# Patient Record
Sex: Female | Born: 1950 | Race: Black or African American | Hispanic: No | Smoking: Former smoker
Health system: Southern US, Community
[De-identification: ages and names within clinical notes are randomized; demographics above are authoritative.]

## PROBLEM LIST (undated history)

## (undated) DIAGNOSIS — N186 End stage renal disease: Secondary | ICD-10-CM

## (undated) DIAGNOSIS — E039 Hypothyroidism, unspecified: Secondary | ICD-10-CM

## (undated) DIAGNOSIS — F419 Anxiety disorder, unspecified: Secondary | ICD-10-CM

## (undated) DIAGNOSIS — M6740 Ganglion, unspecified site: Secondary | ICD-10-CM

## (undated) DIAGNOSIS — K219 Gastro-esophageal reflux disease without esophagitis: Secondary | ICD-10-CM

## (undated) DIAGNOSIS — Z953 Presence of xenogenic heart valve: Secondary | ICD-10-CM

## (undated) DIAGNOSIS — Z8679 Personal history of other diseases of the circulatory system: Secondary | ICD-10-CM

## (undated) DIAGNOSIS — I509 Heart failure, unspecified: Secondary | ICD-10-CM

## (undated) DIAGNOSIS — R519 Headache, unspecified: Secondary | ICD-10-CM

## (undated) DIAGNOSIS — J40 Bronchitis, not specified as acute or chronic: Secondary | ICD-10-CM

## (undated) DIAGNOSIS — I219 Acute myocardial infarction, unspecified: Secondary | ICD-10-CM

## (undated) DIAGNOSIS — M1990 Unspecified osteoarthritis, unspecified site: Secondary | ICD-10-CM

## (undated) DIAGNOSIS — H269 Unspecified cataract: Secondary | ICD-10-CM

## (undated) DIAGNOSIS — R0600 Dyspnea, unspecified: Secondary | ICD-10-CM

## (undated) DIAGNOSIS — I499 Cardiac arrhythmia, unspecified: Secondary | ICD-10-CM

## (undated) DIAGNOSIS — N184 Chronic kidney disease, stage 4 (severe): Secondary | ICD-10-CM

## (undated) DIAGNOSIS — M797 Fibromyalgia: Secondary | ICD-10-CM

## (undated) DIAGNOSIS — I480 Paroxysmal atrial fibrillation: Secondary | ICD-10-CM

## (undated) DIAGNOSIS — F32A Depression, unspecified: Secondary | ICD-10-CM

## (undated) DIAGNOSIS — I340 Nonrheumatic mitral (valve) insufficiency: Secondary | ICD-10-CM

## (undated) DIAGNOSIS — R011 Cardiac murmur, unspecified: Secondary | ICD-10-CM

## (undated) DIAGNOSIS — R51 Headache: Secondary | ICD-10-CM

## (undated) DIAGNOSIS — K429 Umbilical hernia without obstruction or gangrene: Secondary | ICD-10-CM

## (undated) DIAGNOSIS — E119 Type 2 diabetes mellitus without complications: Secondary | ICD-10-CM

## (undated) DIAGNOSIS — I10 Essential (primary) hypertension: Secondary | ICD-10-CM

## (undated) DIAGNOSIS — J189 Pneumonia, unspecified organism: Secondary | ICD-10-CM

## (undated) DIAGNOSIS — I2510 Atherosclerotic heart disease of native coronary artery without angina pectoris: Secondary | ICD-10-CM

## (undated) DIAGNOSIS — E785 Hyperlipidemia, unspecified: Secondary | ICD-10-CM

## (undated) DIAGNOSIS — Z9289 Personal history of other medical treatment: Secondary | ICD-10-CM

## (undated) DIAGNOSIS — Z9889 Other specified postprocedural states: Secondary | ICD-10-CM

## (undated) DIAGNOSIS — I639 Cerebral infarction, unspecified: Secondary | ICD-10-CM

## (undated) DIAGNOSIS — D649 Anemia, unspecified: Secondary | ICD-10-CM

## (undated) DIAGNOSIS — F329 Major depressive disorder, single episode, unspecified: Secondary | ICD-10-CM

## (undated) HISTORY — PX: ABDOMINAL HYSTERECTOMY: SHX81

## (undated) HISTORY — PX: TUBAL LIGATION: SHX77

## (undated) HISTORY — PX: CARDIAC CATHETERIZATION: SHX172

## (undated) HISTORY — PX: COLONOSCOPY: SHX174

## (undated) HISTORY — PX: TONSILLECTOMY: SUR1361

---
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
## 2017-01-07 ENCOUNTER — Other Ambulatory Visit: Payer: Self-pay | Admitting: Nephrology

## 2017-01-07 DIAGNOSIS — N184 Chronic kidney disease, stage 4 (severe): Secondary | ICD-10-CM

## 2017-01-08 ENCOUNTER — Other Ambulatory Visit: Payer: Self-pay

## 2017-01-13 ENCOUNTER — Ambulatory Visit
Admission: RE | Admit: 2017-01-13 | Discharge: 2017-01-13 | Disposition: A | Discharge status: Home / Self Care | Payer: Medicare Other | Source: Ambulatory Visit | Attending: Nephrology | Admitting: Nephrology

## 2017-01-13 DIAGNOSIS — N184 Chronic kidney disease, stage 4 (severe): Secondary | ICD-10-CM

## 2017-03-12 ENCOUNTER — Inpatient Hospital Stay (HOSPITAL_COMMUNITY)
Admission: EM | Admit: 2017-03-12 | Discharge: 2017-04-04 | DRG: 291 | Disposition: A | Discharge status: Skilled Nursing Facility | Payer: Medicare Other | Source: Other Acute Inpatient Hospital | Attending: Internal Medicine | Admitting: Internal Medicine

## 2017-03-12 ENCOUNTER — Inpatient Hospital Stay (HOSPITAL_COMMUNITY): Payer: Medicare Other

## 2017-03-12 ENCOUNTER — Encounter (HOSPITAL_COMMUNITY): Payer: Self-pay

## 2017-03-12 DIAGNOSIS — I5042 Chronic combined systolic (congestive) and diastolic (congestive) heart failure: Secondary | ICD-10-CM

## 2017-03-12 DIAGNOSIS — E871 Hypo-osmolality and hyponatremia: Secondary | ICD-10-CM | POA: Diagnosis present

## 2017-03-12 DIAGNOSIS — R0602 Shortness of breath: Secondary | ICD-10-CM

## 2017-03-12 DIAGNOSIS — Z452 Encounter for adjustment and management of vascular access device: Secondary | ICD-10-CM

## 2017-03-12 DIAGNOSIS — I5043 Acute on chronic combined systolic (congestive) and diastolic (congestive) heart failure: Secondary | ICD-10-CM

## 2017-03-12 DIAGNOSIS — N179 Acute kidney failure, unspecified: Secondary | ICD-10-CM | POA: Diagnosis present

## 2017-03-12 DIAGNOSIS — I051 Rheumatic mitral insufficiency: Secondary | ICD-10-CM | POA: Diagnosis not present

## 2017-03-12 DIAGNOSIS — N139 Obstructive and reflux uropathy, unspecified: Secondary | ICD-10-CM | POA: Diagnosis present

## 2017-03-12 DIAGNOSIS — T463X5A Adverse effect of coronary vasodilators, initial encounter: Secondary | ICD-10-CM | POA: Diagnosis not present

## 2017-03-12 DIAGNOSIS — E119 Type 2 diabetes mellitus without complications: Secondary | ICD-10-CM

## 2017-03-12 DIAGNOSIS — I493 Ventricular premature depolarization: Secondary | ICD-10-CM | POA: Diagnosis present

## 2017-03-12 DIAGNOSIS — Z936 Other artificial openings of urinary tract status: Secondary | ICD-10-CM

## 2017-03-12 DIAGNOSIS — I340 Nonrheumatic mitral (valve) insufficiency: Secondary | ICD-10-CM | POA: Diagnosis not present

## 2017-03-12 DIAGNOSIS — Y92239 Unspecified place in hospital as the place of occurrence of the external cause: Secondary | ICD-10-CM | POA: Diagnosis not present

## 2017-03-12 DIAGNOSIS — K661 Hemoperitoneum: Secondary | ICD-10-CM | POA: Diagnosis present

## 2017-03-12 DIAGNOSIS — D631 Anemia in chronic kidney disease: Secondary | ICD-10-CM | POA: Diagnosis not present

## 2017-03-12 DIAGNOSIS — E039 Hypothyroidism, unspecified: Secondary | ICD-10-CM | POA: Diagnosis not present

## 2017-03-12 DIAGNOSIS — K089 Disorder of teeth and supporting structures, unspecified: Secondary | ICD-10-CM

## 2017-03-12 DIAGNOSIS — Z9119 Patient's noncompliance with other medical treatment and regimen: Secondary | ICD-10-CM

## 2017-03-12 DIAGNOSIS — I5033 Acute on chronic diastolic (congestive) heart failure: Secondary | ICD-10-CM | POA: Diagnosis present

## 2017-03-12 DIAGNOSIS — G9341 Metabolic encephalopathy: Secondary | ICD-10-CM | POA: Diagnosis present

## 2017-03-12 DIAGNOSIS — R1084 Generalized abdominal pain: Secondary | ICD-10-CM

## 2017-03-12 DIAGNOSIS — F419 Anxiety disorder, unspecified: Secondary | ICD-10-CM | POA: Diagnosis present

## 2017-03-12 DIAGNOSIS — F17210 Nicotine dependence, cigarettes, uncomplicated: Secondary | ICD-10-CM | POA: Diagnosis present

## 2017-03-12 DIAGNOSIS — Z9114 Patient's other noncompliance with medication regimen: Secondary | ICD-10-CM

## 2017-03-12 DIAGNOSIS — D638 Anemia in other chronic diseases classified elsewhere: Secondary | ICD-10-CM

## 2017-03-12 DIAGNOSIS — G9340 Encephalopathy, unspecified: Secondary | ICD-10-CM

## 2017-03-12 DIAGNOSIS — E1165 Type 2 diabetes mellitus with hyperglycemia: Secondary | ICD-10-CM

## 2017-03-12 DIAGNOSIS — R52 Pain, unspecified: Secondary | ICD-10-CM

## 2017-03-12 DIAGNOSIS — I272 Pulmonary hypertension, unspecified: Secondary | ICD-10-CM

## 2017-03-12 DIAGNOSIS — E876 Hypokalemia: Secondary | ICD-10-CM | POA: Diagnosis not present

## 2017-03-12 DIAGNOSIS — Z79899 Other long term (current) drug therapy: Secondary | ICD-10-CM

## 2017-03-12 DIAGNOSIS — N183 Chronic kidney disease, stage 3 unspecified: Secondary | ICD-10-CM | POA: Diagnosis present

## 2017-03-12 DIAGNOSIS — I4892 Unspecified atrial flutter: Secondary | ICD-10-CM | POA: Diagnosis present

## 2017-03-12 DIAGNOSIS — D649 Anemia, unspecified: Secondary | ICD-10-CM | POA: Diagnosis not present

## 2017-03-12 DIAGNOSIS — T502X5A Adverse effect of carbonic-anhydrase inhibitors, benzothiadiazides and other diuretics, initial encounter: Secondary | ICD-10-CM | POA: Diagnosis present

## 2017-03-12 DIAGNOSIS — D62 Acute posthemorrhagic anemia: Secondary | ICD-10-CM | POA: Diagnosis present

## 2017-03-12 DIAGNOSIS — F329 Major depressive disorder, single episode, unspecified: Secondary | ICD-10-CM | POA: Diagnosis present

## 2017-03-12 DIAGNOSIS — J9600 Acute respiratory failure, unspecified whether with hypoxia or hypercapnia: Secondary | ICD-10-CM

## 2017-03-12 DIAGNOSIS — K5900 Constipation, unspecified: Secondary | ICD-10-CM

## 2017-03-12 DIAGNOSIS — I480 Paroxysmal atrial fibrillation: Secondary | ICD-10-CM | POA: Diagnosis not present

## 2017-03-12 DIAGNOSIS — D500 Iron deficiency anemia secondary to blood loss (chronic): Secondary | ICD-10-CM

## 2017-03-12 DIAGNOSIS — K219 Gastro-esophageal reflux disease without esophagitis: Secondary | ICD-10-CM | POA: Diagnosis present

## 2017-03-12 DIAGNOSIS — I2510 Atherosclerotic heart disease of native coronary artery without angina pectoris: Secondary | ICD-10-CM | POA: Diagnosis present

## 2017-03-12 DIAGNOSIS — D72829 Elevated white blood cell count, unspecified: Secondary | ICD-10-CM | POA: Diagnosis not present

## 2017-03-12 DIAGNOSIS — K029 Dental caries, unspecified: Secondary | ICD-10-CM | POA: Diagnosis present

## 2017-03-12 DIAGNOSIS — J9601 Acute respiratory failure with hypoxia: Secondary | ICD-10-CM | POA: Diagnosis present

## 2017-03-12 DIAGNOSIS — A419 Sepsis, unspecified organism: Secondary | ICD-10-CM | POA: Diagnosis not present

## 2017-03-12 DIAGNOSIS — F1910 Other psychoactive substance abuse, uncomplicated: Secondary | ICD-10-CM

## 2017-03-12 DIAGNOSIS — R0989 Other specified symptoms and signs involving the circulatory and respiratory systems: Secondary | ICD-10-CM | POA: Diagnosis not present

## 2017-03-12 DIAGNOSIS — J90 Pleural effusion, not elsewhere classified: Secondary | ICD-10-CM

## 2017-03-12 DIAGNOSIS — E118 Type 2 diabetes mellitus with unspecified complications: Secondary | ICD-10-CM

## 2017-03-12 DIAGNOSIS — E1122 Type 2 diabetes mellitus with diabetic chronic kidney disease: Secondary | ICD-10-CM | POA: Diagnosis present

## 2017-03-12 DIAGNOSIS — K117 Disturbances of salivary secretion: Secondary | ICD-10-CM | POA: Diagnosis present

## 2017-03-12 DIAGNOSIS — I959 Hypotension, unspecified: Secondary | ICD-10-CM | POA: Diagnosis not present

## 2017-03-12 DIAGNOSIS — I509 Heart failure, unspecified: Secondary | ICD-10-CM

## 2017-03-12 DIAGNOSIS — I469 Cardiac arrest, cause unspecified: Secondary | ICD-10-CM

## 2017-03-12 DIAGNOSIS — I248 Other forms of acute ischemic heart disease: Secondary | ICD-10-CM | POA: Diagnosis present

## 2017-03-12 DIAGNOSIS — N170 Acute kidney failure with tubular necrosis: Secondary | ICD-10-CM | POA: Diagnosis present

## 2017-03-12 DIAGNOSIS — F32A Depression, unspecified: Secondary | ICD-10-CM | POA: Insufficient documentation

## 2017-03-12 DIAGNOSIS — E872 Acidosis: Secondary | ICD-10-CM | POA: Diagnosis not present

## 2017-03-12 DIAGNOSIS — Z7982 Long term (current) use of aspirin: Secondary | ICD-10-CM

## 2017-03-12 DIAGNOSIS — R579 Shock, unspecified: Secondary | ICD-10-CM | POA: Diagnosis not present

## 2017-03-12 DIAGNOSIS — N185 Chronic kidney disease, stage 5: Secondary | ICD-10-CM | POA: Diagnosis present

## 2017-03-12 DIAGNOSIS — I132 Hypertensive heart and chronic kidney disease with heart failure and with stage 5 chronic kidney disease, or end stage renal disease: Secondary | ICD-10-CM | POA: Diagnosis present

## 2017-03-12 DIAGNOSIS — R51 Headache: Secondary | ICD-10-CM | POA: Diagnosis not present

## 2017-03-12 DIAGNOSIS — A4159 Other Gram-negative sepsis: Secondary | ICD-10-CM | POA: Diagnosis not present

## 2017-03-12 DIAGNOSIS — D509 Iron deficiency anemia, unspecified: Secondary | ICD-10-CM | POA: Diagnosis present

## 2017-03-12 DIAGNOSIS — N12 Tubulo-interstitial nephritis, not specified as acute or chronic: Secondary | ICD-10-CM | POA: Diagnosis not present

## 2017-03-12 DIAGNOSIS — E875 Hyperkalemia: Secondary | ICD-10-CM | POA: Diagnosis present

## 2017-03-12 DIAGNOSIS — N136 Pyonephrosis: Secondary | ICD-10-CM | POA: Diagnosis present

## 2017-03-12 DIAGNOSIS — J189 Pneumonia, unspecified organism: Secondary | ICD-10-CM

## 2017-03-12 DIAGNOSIS — J441 Chronic obstructive pulmonary disease with (acute) exacerbation: Secondary | ICD-10-CM | POA: Diagnosis present

## 2017-03-12 DIAGNOSIS — N189 Chronic kidney disease, unspecified: Secondary | ICD-10-CM | POA: Diagnosis not present

## 2017-03-12 DIAGNOSIS — Z1623 Resistance to quinolones and fluoroquinolones: Secondary | ICD-10-CM | POA: Diagnosis present

## 2017-03-12 DIAGNOSIS — R58 Hemorrhage, not elsewhere classified: Secondary | ICD-10-CM | POA: Diagnosis not present

## 2017-03-12 DIAGNOSIS — J9621 Acute and chronic respiratory failure with hypoxia: Secondary | ICD-10-CM | POA: Diagnosis present

## 2017-03-12 DIAGNOSIS — Z9889 Other specified postprocedural states: Secondary | ICD-10-CM

## 2017-03-12 DIAGNOSIS — Z09 Encounter for follow-up examination after completed treatment for conditions other than malignant neoplasm: Secondary | ICD-10-CM

## 2017-03-12 DIAGNOSIS — B9620 Unspecified Escherichia coli [E. coli] as the cause of diseases classified elsewhere: Secondary | ICD-10-CM | POA: Diagnosis present

## 2017-03-12 DIAGNOSIS — Z888 Allergy status to other drugs, medicaments and biological substances status: Secondary | ICD-10-CM

## 2017-03-12 DIAGNOSIS — I252 Old myocardial infarction: Secondary | ICD-10-CM

## 2017-03-12 DIAGNOSIS — B961 Klebsiella pneumoniae [K. pneumoniae] as the cause of diseases classified elsewhere: Secondary | ICD-10-CM | POA: Diagnosis present

## 2017-03-12 DIAGNOSIS — I5031 Acute diastolic (congestive) heart failure: Secondary | ICD-10-CM

## 2017-03-12 DIAGNOSIS — R001 Bradycardia, unspecified: Secondary | ICD-10-CM | POA: Diagnosis not present

## 2017-03-12 DIAGNOSIS — N132 Hydronephrosis with renal and ureteral calculous obstruction: Secondary | ICD-10-CM | POA: Diagnosis not present

## 2017-03-12 DIAGNOSIS — J9690 Respiratory failure, unspecified, unspecified whether with hypoxia or hypercapnia: Secondary | ICD-10-CM

## 2017-03-12 DIAGNOSIS — R7881 Bacteremia: Secondary | ICD-10-CM | POA: Diagnosis not present

## 2017-03-12 DIAGNOSIS — I059 Rheumatic mitral valve disease, unspecified: Secondary | ICD-10-CM | POA: Diagnosis present

## 2017-03-12 DIAGNOSIS — Z978 Presence of other specified devices: Secondary | ICD-10-CM

## 2017-03-12 DIAGNOSIS — Z539 Procedure and treatment not carried out, unspecified reason: Secondary | ICD-10-CM | POA: Diagnosis not present

## 2017-03-12 DIAGNOSIS — K083 Retained dental root: Secondary | ICD-10-CM | POA: Diagnosis present

## 2017-03-12 DIAGNOSIS — N184 Chronic kidney disease, stage 4 (severe): Secondary | ICD-10-CM | POA: Diagnosis not present

## 2017-03-12 DIAGNOSIS — E785 Hyperlipidemia, unspecified: Secondary | ICD-10-CM | POA: Insufficient documentation

## 2017-03-12 DIAGNOSIS — R1310 Dysphagia, unspecified: Secondary | ICD-10-CM | POA: Diagnosis not present

## 2017-03-12 DIAGNOSIS — A4101 Sepsis due to Methicillin susceptible Staphylococcus aureus: Secondary | ICD-10-CM | POA: Diagnosis not present

## 2017-03-12 DIAGNOSIS — M1990 Unspecified osteoarthritis, unspecified site: Secondary | ICD-10-CM | POA: Diagnosis present

## 2017-03-12 DIAGNOSIS — K0530 Chronic periodontitis, unspecified: Secondary | ICD-10-CM | POA: Diagnosis present

## 2017-03-12 DIAGNOSIS — I361 Nonrheumatic tricuspid (valve) insufficiency: Secondary | ICD-10-CM | POA: Diagnosis not present

## 2017-03-12 DIAGNOSIS — M264 Malocclusion, unspecified: Secondary | ICD-10-CM | POA: Diagnosis present

## 2017-03-12 DIAGNOSIS — I498 Other specified cardiac arrhythmias: Secondary | ICD-10-CM | POA: Diagnosis not present

## 2017-03-12 DIAGNOSIS — I10 Essential (primary) hypertension: Secondary | ICD-10-CM | POA: Diagnosis not present

## 2017-03-12 DIAGNOSIS — Z8673 Personal history of transient ischemic attack (TIA), and cerebral infarction without residual deficits: Secondary | ICD-10-CM

## 2017-03-12 DIAGNOSIS — E861 Hypovolemia: Secondary | ICD-10-CM | POA: Diagnosis present

## 2017-03-12 DIAGNOSIS — Z781 Physical restraint status: Secondary | ICD-10-CM

## 2017-03-12 DIAGNOSIS — J9622 Acute and chronic respiratory failure with hypercapnia: Secondary | ICD-10-CM

## 2017-03-12 DIAGNOSIS — E1142 Type 2 diabetes mellitus with diabetic polyneuropathy: Secondary | ICD-10-CM | POA: Diagnosis present

## 2017-03-12 DIAGNOSIS — Z4659 Encounter for fitting and adjustment of other gastrointestinal appliance and device: Secondary | ICD-10-CM

## 2017-03-12 DIAGNOSIS — N1330 Unspecified hydronephrosis: Secondary | ICD-10-CM

## 2017-03-12 HISTORY — DX: Anxiety disorder, unspecified: F41.9

## 2017-03-12 HISTORY — DX: Major depressive disorder, single episode, unspecified: F32.9

## 2017-03-12 HISTORY — DX: Hypothyroidism, unspecified: E03.9

## 2017-03-12 HISTORY — DX: Cerebral infarction, unspecified: I63.9

## 2017-03-12 HISTORY — DX: Hyperlipidemia, unspecified: E78.5

## 2017-03-12 HISTORY — DX: Gastro-esophageal reflux disease without esophagitis: K21.9

## 2017-03-12 HISTORY — DX: Depression, unspecified: F32.A

## 2017-03-12 HISTORY — DX: Unspecified osteoarthritis, unspecified site: M19.90

## 2017-03-12 HISTORY — DX: Type 2 diabetes mellitus without complications: E11.9

## 2017-03-12 HISTORY — DX: Bronchitis, not specified as acute or chronic: J40

## 2017-03-12 HISTORY — DX: Essential (primary) hypertension: I10

## 2017-03-12 LAB — URINALYSIS, ROUTINE W REFLEX MICROSCOPIC
Bacteria, UA: NONE SEEN
Bilirubin Urine: NEGATIVE
Glucose, UA: NEGATIVE mg/dL
Hgb urine dipstick: NEGATIVE
Ketones, ur: NEGATIVE mg/dL
Nitrite: NEGATIVE
Protein, ur: 100 mg/dL — AB
Specific Gravity, Urine: 1.008 (ref 1.005–1.030)
Squamous Epithelial / LPF: NONE SEEN
pH: 6 (ref 5.0–8.0)

## 2017-03-12 LAB — GLUCOSE, CAPILLARY
Glucose-Capillary: 113 mg/dL — ABNORMAL HIGH (ref 65–99)
Glucose-Capillary: 146 mg/dL — ABNORMAL HIGH (ref 65–99)
Glucose-Capillary: 143 mg/dL — ABNORMAL HIGH (ref 65–99)

## 2017-03-12 LAB — CREATININE, SERUM
Creatinine, Ser: 3.29 mg/dL — ABNORMAL HIGH (ref 0.44–1.00)
GFR calc Af Amer: 16 mL/min — ABNORMAL LOW (ref >60)
GFR calc non Af Amer: 14 mL/min — ABNORMAL LOW (ref >60)

## 2017-03-12 LAB — HEMOGLOBIN A1C
Hgb A1c MFr Bld: 6.2 % — ABNORMAL HIGH (ref 4.8–5.6)
Mean Plasma Glucose: 131.24 mg/dL

## 2017-03-12 LAB — ECHOCARDIOGRAM COMPLETE: Height: 62 in

## 2017-03-12 LAB — CBC
HCT: 28 % — ABNORMAL LOW (ref 36.0–46.0)
Hemoglobin: 9.6 g/dL — ABNORMAL LOW (ref 12.0–15.0)
MCH: 26.5 pg (ref 26.0–34.0)
MCHC: 34.3 g/dL (ref 30.0–36.0)
MCV: 77.3 fL — ABNORMAL LOW (ref 78.0–100.0)
Platelets: 275 10*3/uL (ref 150–400)
RBC: 3.62 MIL/uL — ABNORMAL LOW (ref 3.87–5.11)
RDW: 14.1 % (ref 11.5–15.5)
WBC: 8.4 10*3/uL (ref 4.0–10.5)

## 2017-03-12 LAB — TROPONIN I
Troponin I: 0.03 ng/mL (ref <0.03)
Troponin I: 0.03 ng/mL (ref <0.03)

## 2017-03-12 LAB — MRSA PCR SCREENING: MRSA by PCR: NEGATIVE

## 2017-03-12 LAB — BRAIN NATRIURETIC PEPTIDE: B Natriuretic Peptide: 2230.5 pg/mL — ABNORMAL HIGH (ref 0.0–100.0)

## 2017-03-12 LAB — TSH: TSH: 1.385 u[IU]/mL (ref 0.350–4.500)

## 2017-03-12 MED ORDER — HYDRALAZINE HCL 20 MG/ML IJ SOLN
10.0000 mg | Freq: Four times a day (QID) | INTRAMUSCULAR | Status: DC | PRN
  Administered 2017-03-12: 10 mg via INTRAVENOUS
  Filled 2017-03-12 (×2): qty 1

## 2017-03-12 MED ORDER — PANTOPRAZOLE SODIUM 40 MG PO TBEC
40.0000 mg | DELAYED_RELEASE_TABLET | Freq: Every day | ORAL | Status: DC
  Administered 2017-03-12 – 2017-03-16 (×5): 40 mg via ORAL
  Filled 2017-03-12 (×5): qty 1

## 2017-03-12 MED ORDER — MONTELUKAST SODIUM 10 MG PO TABS
10.0000 mg | ORAL_TABLET | Freq: Every day | ORAL | Status: DC
  Administered 2017-03-12 – 2017-03-15 (×4): 10 mg via ORAL
  Filled 2017-03-12 (×5): qty 1

## 2017-03-12 MED ORDER — GABAPENTIN 300 MG PO CAPS
300.0000 mg | ORAL_CAPSULE | ORAL | Status: DC
  Administered 2017-03-13 – 2017-03-15 (×2): 300 mg via ORAL
  Filled 2017-03-12 (×2): qty 1

## 2017-03-12 MED ORDER — CARVEDILOL 3.125 MG PO TABS: 3.1250 mg | ORAL_TABLET | Freq: Two times a day (BID) | ORAL | Status: DC

## 2017-03-12 MED ORDER — TRAMADOL HCL 50 MG PO TABS
50.0000 mg | ORAL_TABLET | Freq: Four times a day (QID) | ORAL | Status: DC | PRN
  Administered 2017-03-12 – 2017-03-15 (×5): 50 mg via ORAL
  Filled 2017-03-12 (×5): qty 1

## 2017-03-12 MED ORDER — OXYCODONE HCL 5 MG PO TABS
10.0000 mg | ORAL_TABLET | Freq: Three times a day (TID) | ORAL | Status: DC | PRN
  Administered 2017-03-12 – 2017-03-15 (×6): 10 mg via ORAL
  Filled 2017-03-12 (×8): qty 2

## 2017-03-12 MED ORDER — SODIUM CHLORIDE 0.9% FLUSH
3.0000 mL | INTRAVENOUS | Status: DC | PRN
  Administered 2017-03-20: 3 mL via INTRAVENOUS
  Filled 2017-03-12: qty 3

## 2017-03-12 MED ORDER — ACETAMINOPHEN 650 MG RE SUPP
650.0000 mg | Freq: Four times a day (QID) | RECTAL | Status: DC | PRN
Start: 2017-03-12 — End: 2017-03-23
  Administered 2017-03-19: 650 mg via RECTAL
  Filled 2017-03-12: qty 1

## 2017-03-12 MED ORDER — ENOXAPARIN SODIUM 30 MG/0.3ML ~~LOC~~ SOLN: 30.0000 mg | SUBCUTANEOUS | Status: DC

## 2017-03-12 MED ORDER — INSULIN ASPART 100 UNIT/ML ~~LOC~~ SOLN
0.0000 [IU] | Freq: Three times a day (TID) | SUBCUTANEOUS | Status: DC
  Administered 2017-03-12: 1 [IU] via SUBCUTANEOUS
  Administered 2017-03-13 – 2017-03-14 (×3): 2 [IU] via SUBCUTANEOUS
  Administered 2017-03-14 (×2): 1 [IU] via SUBCUTANEOUS
  Administered 2017-03-15: 7 [IU] via SUBCUTANEOUS
  Administered 2017-03-15 (×2): 3 [IU] via SUBCUTANEOUS
  Administered 2017-03-16: 2 [IU] via SUBCUTANEOUS

## 2017-03-12 MED ORDER — LABETALOL HCL 200 MG PO TABS
200.0000 mg | ORAL_TABLET | Freq: Two times a day (BID) | ORAL | Status: DC
  Administered 2017-03-12 – 2017-03-16 (×8): 200 mg via ORAL
  Filled 2017-03-12 (×9): qty 1

## 2017-03-12 MED ORDER — DULOXETINE HCL 30 MG PO CPEP
30.0000 mg | ORAL_CAPSULE | Freq: Every day | ORAL | Status: DC
  Administered 2017-03-12 – 2017-03-16 (×5): 30 mg via ORAL
  Filled 2017-03-12 (×5): qty 1

## 2017-03-12 MED ORDER — ORAL CARE MOUTH RINSE
15.0000 mL | Freq: Two times a day (BID) | OROMUCOSAL | Status: DC
  Administered 2017-03-13 – 2017-03-16 (×4): 15 mL via OROMUCOSAL

## 2017-03-12 MED ORDER — ALPRAZOLAM 0.5 MG PO TABS
0.5000 mg | ORAL_TABLET | Freq: Three times a day (TID) | ORAL | Status: DC | PRN
  Administered 2017-03-12 – 2017-03-15 (×8): 0.5 mg via ORAL
  Filled 2017-03-12 (×8): qty 1

## 2017-03-12 MED ORDER — TIZANIDINE HCL 4 MG PO TABS
4.0000 mg | ORAL_TABLET | Freq: Three times a day (TID) | ORAL | Status: DC
  Administered 2017-03-12 – 2017-03-16 (×12): 4 mg via ORAL
  Filled 2017-03-12 (×13): qty 1

## 2017-03-12 MED ORDER — HYDRALAZINE HCL 20 MG/ML IJ SOLN: 20.0000 mg | Freq: Four times a day (QID) | INTRAMUSCULAR | Status: DC | PRN

## 2017-03-12 MED ORDER — POLYETHYLENE GLYCOL 3350 17 G PO PACK: 17.0000 g | PACK | Freq: Every day | ORAL | Status: DC | PRN

## 2017-03-12 MED ORDER — MINOXIDIL 2.5 MG PO TABS: 2.5000 mg | ORAL_TABLET | Freq: Every day | ORAL | Status: DC

## 2017-03-12 MED ORDER — ASPIRIN EC 81 MG PO TBEC
81.0000 mg | DELAYED_RELEASE_TABLET | Freq: Every day | ORAL | Status: DC
  Administered 2017-03-12 – 2017-03-16 (×5): 81 mg via ORAL
  Filled 2017-03-12 (×5): qty 1

## 2017-03-12 MED ORDER — SODIUM CHLORIDE 0.9 % IV SOLN: 250.0000 mL | INTRAVENOUS | Status: DC | PRN

## 2017-03-12 MED ORDER — FUROSEMIDE 10 MG/ML IJ SOLN
40.0000 mg | Freq: Two times a day (BID) | INTRAMUSCULAR | Status: DC
  Administered 2017-03-12 – 2017-03-14 (×5): 40 mg via INTRAVENOUS
  Filled 2017-03-12 (×5): qty 4

## 2017-03-12 MED ORDER — BUPROPION HCL ER (XL) 150 MG PO TB24
300.0000 mg | ORAL_TABLET | Freq: Every day | ORAL | Status: DC
  Administered 2017-03-12 – 2017-03-16 (×5): 300 mg via ORAL
  Filled 2017-03-12 (×3): qty 1
  Filled 2017-03-12: qty 2
  Filled 2017-03-12: qty 1

## 2017-03-12 MED ORDER — ATORVASTATIN CALCIUM 40 MG PO TABS
40.0000 mg | ORAL_TABLET | Freq: Every day | ORAL | Status: DC
  Administered 2017-03-12 – 2017-03-16 (×5): 40 mg via ORAL
  Filled 2017-03-12 (×5): qty 1

## 2017-03-12 MED ORDER — ZOLPIDEM TARTRATE 5 MG PO TABS
5.0000 mg | ORAL_TABLET | Freq: Every evening | ORAL | Status: DC | PRN
  Filled 2017-03-12: qty 1

## 2017-03-12 MED ORDER — SODIUM CHLORIDE 0.9% FLUSH
3.0000 mL | Freq: Two times a day (BID) | INTRAVENOUS | Status: DC
  Administered 2017-03-12 – 2017-03-23 (×19): 3 mL via INTRAVENOUS

## 2017-03-12 MED ORDER — ACETAMINOPHEN 325 MG PO TABS
650.0000 mg | ORAL_TABLET | Freq: Four times a day (QID) | ORAL | Status: DC | PRN
Start: 2017-03-12 — End: 2017-03-23
  Administered 2017-03-15 – 2017-03-22 (×4): 650 mg via ORAL
  Filled 2017-03-12 (×4): qty 2

## 2017-03-12 MED ORDER — LEVALBUTEROL HCL 0.63 MG/3ML IN NEBU
0.6300 mg | INHALATION_SOLUTION | RESPIRATORY_TRACT | Status: DC | PRN
  Administered 2017-03-12: 0.63 mg via RESPIRATORY_TRACT
  Filled 2017-03-12: qty 3

## 2017-03-12 MED ORDER — METOPROLOL TARTRATE 5 MG/5ML IV SOLN
5.0000 mg | Freq: Four times a day (QID) | INTRAVENOUS | Status: DC
  Administered 2017-03-12 – 2017-03-13 (×3): 5 mg via INTRAVENOUS
  Filled 2017-03-12 (×3): qty 5

## 2017-03-12 MED ORDER — ENOXAPARIN SODIUM 30 MG/0.3ML ~~LOC~~ SOLN
30.0000 mg | SUBCUTANEOUS | Status: DC
  Administered 2017-03-13 – 2017-03-15 (×3): 30 mg via SUBCUTANEOUS
  Filled 2017-03-12 (×3): qty 0.3

## 2017-03-12 MED ORDER — LEVOTHYROXINE SODIUM 112 MCG PO TABS
112.0000 ug | ORAL_TABLET | Freq: Every day | ORAL | Status: DC
  Administered 2017-03-12 – 2017-03-16 (×5): 112 ug via ORAL
  Filled 2017-03-12 (×6): qty 1

## 2017-03-12 MED ORDER — AMLODIPINE BESYLATE 10 MG PO TABS
10.0000 mg | ORAL_TABLET | Freq: Every day | ORAL | Status: DC
  Administered 2017-03-12 – 2017-03-14 (×3): 10 mg via ORAL
  Filled 2017-03-12 (×3): qty 1

## 2017-03-12 MED ORDER — HYDRALAZINE HCL 50 MG PO TABS
100.0000 mg | ORAL_TABLET | Freq: Three times a day (TID) | ORAL | Status: DC
  Administered 2017-03-12 – 2017-03-14 (×6): 100 mg via ORAL
  Filled 2017-03-12 (×6): qty 2

## 2017-03-12 MED ORDER — PREGABALIN 75 MG PO CAPS
150.0000 mg | ORAL_CAPSULE | ORAL | Status: DC
  Administered 2017-03-13 – 2017-03-15 (×2): 150 mg via ORAL
  Filled 2017-03-12 (×2): qty 2

## 2017-03-12 MED ORDER — BUSPIRONE HCL 5 MG PO TABS
15.0000 mg | ORAL_TABLET | Freq: Every day | ORAL | Status: DC
  Administered 2017-03-13 – 2017-03-16 (×4): 15 mg via ORAL
  Filled 2017-03-12 (×5): qty 1

## 2017-03-12 NOTE — Progress Notes (Signed)
  Echocardiogram 2D Echocardiogram has been performed.  Jocelyn Hill 03/12/2017, 1:17 PM

## 2017-03-12 NOTE — Care Management Note (Signed)
Case Management Note  Patient Details  Name: Jocelyn Hill. Mallicoat MRN: [id number] Date of Birth: 22-Sep-1950  Subjective/Objective:                  66 y/o F with pmh of HTN and CKD stage III; percentage with complaints of shortness of breath for the last few days. On admission patient was found to be in significant respiratory distress for which she was placed on CPAP. BiPAP has helped decrease labored breathing. Chest x-ray showed moderate bilateral pleural effusions. Initial ABG on 100% arch and revealed pH of 7.34, PCO2 46, PO2 84.  Labs revealed a BNP of 1700, WBC 12, hemoglobin 10, INR 1.2, sodium 142, potassium 3.3, chloride 107, CO2 25, BUN 26, Cr 3.2, glucose 193. Patient was given 40 mg of Lasix and nitroglycerin place was applied. There were unable to wean her off of BiPAP  requested transfer for heart failure with renal failure as last available creatinine was 2.1 over 1 year ago.  Action/Plan: Date:  March 12, 2017 Chart reviewed for concurrent status and case management needs. Will continue to follow patient progress. Discharge Planning: following for needs Expected discharge date: [id number] Velva Harman, BSN, [place], [place] Expected Discharge Date:   (UNKNOWN)               Expected Discharge Plan:  [place]  In-House Referral:     Discharge planning Services  CM Consult  Post Acute Care Choice:  Home Health Choice offered to:  NA  DME Arranged:    DME Agency:     HH Arranged:  Disease Management [place] Agency:  E. Lopez  Status of Service:  In process, will continue to follow  If discussed at Long Length of Stay Meetings, dates discussed:    Additional Comments:  Leeroy Cha, RN 03/12/2017, 12:07 PM

## 2017-03-12 NOTE — Plan of Care (Signed)
Transfer from [place] accepted to stepdown bed. Ms. Jocelyn Hill is a 66 y/o F with pmh of HTN and CKD stage III; percentage with complaints of shortness of breath for the last few days. On admission patient was found to be in significant respiratory distress for which she was placed on CPAP. BiPAP has helped decrease labored breathing. Chest x-ray showed moderate bilateral pleural effusions. Initial ABG on 100% arch and revealed pH of 7.34, PCO2 46, PO2 84.  Labs revealed a BNP of 1700, WBC 12, hemoglobin 10, INR 1.2, sodium 142, potassium 3.3, chloride 107, CO2 25, BUN 26, Cr 3.2, glucose 193. Patient was given 40 mg of Lasix and nitroglycerin place was applied. There were unable to wean her off of BiPAP  requested transfer for heart failure with renal failure as last available creatinine was 2.1 over 1 year ago.

## 2017-03-12 NOTE — H&P (Signed)
History and Physical  Jocelyn Hill ION:[id number] DOB: [birth year] DOA: 03/12/2017  Referring physician: [facility]  PCP: [name], MD  Outpatient Specialists: None Patient coming from: Home & is able to ambulate without assistance  Chief Complaint: Shortness of breath   HPI: Jocelyn Hill is a 66 y.o. female with medical history significant for poorly controlled hypertension with stage III chronic kidney disease plus diabetes mellitus with peripheral neuropathy and hypothyroidism who states that over the past month, she is noted she has become more winded. Then starting on Saturday, 9/8, patient cut the point where she felt like she is having a lot of trouble breathing. The symptoms continued to progress to the point where she could not take it anymore and she came in to the emergency room at [facility] for further evaluation. Patient found to have a BNP of 1700 with a new diagnosis of CHF. Chest x-ray noted moderate bilateral pleural effusions. Patient was given Lasix and put on nitroglycerin patch. Blood pressure systolic in the 244W-102V. Patient initially BiPAP to keep oxygen saturations greater than 90%. In addition, creatinine noted to be 3.2 or baseline was 2.1. Because of worsening creatinine and decompensated CHF, it was felt best to transfer this patient to [facility]. Hospitalists accepted transfer.  Review of Systems: Patient seen after arrival to stepdown . Pt complains of shortness of breath, mild headache and chest discomfort, described as an ache and a little bit of pressure. Her shortness of breath is at rest although it is better than when she first came in. She occasionally has a nonproductive cough. Her chest pressure/ache is described as located midsternally with some radiation in all directions. Does not go to her back. No associated nausea or vomiting or dizziness. Patient complains of chronic numbness in her feet from neuropathy. She also feels like  in the past few weeks, she's had the faculty swallowing, feeling like something is stuck in her throat. Patient also states that in the past few months, some of her teeth have fallen out. She is not sure why. She also states that she gets very winded very easily with any exertion  Pt denies any vision changes, palpitations, wheezing, abdominal pain, hematuria, dysuria, constipation, diarrhea, focal extremity weakness or pain .  Review of systems are otherwise negative   Past Medical History:  Diagnosis Date  . Anxiety   . Arthritis   . Bronchitis   . Depression   . Diabetes mellitus without complication ([place])   . Diet-controlled diabetes mellitus ([place])   . GERD (gastroesophageal reflux disease)   . Hyperlipidemia   . Hypertension   . Hypothyroidism   . Stroke [facility])    Past Surgical History:  Procedure Laterality Date  . ABDOMINAL HYSTERECTOMY     PARTIALS  . TONSILLECTOMY    . TUBAL LIGATION      Social History:  Patient does not smoke regular cigarettes.  She has never used smokeless tobacco. She reports that she does not drink alcohol. She occasionally smokes marijuana. She is able to ambulate without assistance although she has no energy, lives at home by herself   Allergies  Allergen Reactions  . Ace Inhibitors Anaphylaxis and Swelling    Family history: Patient states heart failure runs in her family  Prior to Admission medications   Medication Sig Start Date End Date Taking? Authorizing Provider  albuterol (PROVENTIL HFA;VENTOLIN HFA) 108 (90 Base) MCG/ACT inhaler Inhale 2 puffs into the lungs every 4 (four) hours as needed  for wheezing or shortness of breath.   Yes [provider]  amLODipine (NORVASC) 10 MG tablet Take 10 mg by mouth daily.   Yes [provider]  aspirin EC 81 MG tablet Take 81 mg by mouth daily.   Yes [provider]  atorvastatin (LIPITOR) 40 MG tablet Take 40 mg by mouth daily.   Yes [provider]  b complex  vitamins capsule Take 1 capsule by mouth daily.   Yes [provider]  buPROPion (WELLBUTRIN XL) 300 MG 24 hr tablet Take 300 mg by mouth daily.   Yes [provider]  cholecalciferol (VITAMIN D) 1000 units tablet Take 1,000 Units by mouth daily.   Yes [provider]  DULoxetine (CYMBALTA) 30 MG capsule Take 30 mg by mouth daily.   Yes [provider]  gabapentin (NEURONTIN) 300 MG capsule Take 300 mg by mouth at bedtime.   Yes [provider]  hydrALAZINE (APRESOLINE) 100 MG tablet Take 100 mg by mouth 3 (three) times daily.   Yes [provider]  labetalol (NORMODYNE) 200 MG tablet Take 200 mg by mouth 2 (two) times daily.   Yes [provider]  levothyroxine (SYNTHROID, LEVOTHROID) 88 MCG tablet Take 88 mcg by mouth daily before breakfast.   Yes [provider]  minoxidil (LONITEN) 2.5 MG tablet Take by mouth daily.   Yes [provider]  montelukast (SINGULAIR) 10 MG tablet Take 10 mg by mouth daily.   Yes [provider]  pantoprazole (PROTONIX) 40 MG tablet Take 40 mg by mouth daily.   Yes [provider]  potassium gluconate (HM POTASSIUM) 595 (99 K) MG TABS tablet Take 595 mg by mouth daily.   Yes [provider]  pregabalin (LYRICA) 150 MG capsule Take 150 mg by mouth daily.   Yes [provider]  tiZANidine (ZANAFLEX) 4 MG capsule Take 4 mg by mouth 3 (three) times daily.   Yes [provider]  zolpidem (AMBIEN) 10 MG tablet Take 10 mg by mouth at bedtime as needed for sleep.   Yes [provider]  busPIRone (BUSPAR) 15 MG tablet Take 15 mg by mouth 2 (two) times daily.    [provider]    Physical Exam: BP (!) 158/117   Pulse 76   Temp 98.2 F (36.8 C) (Oral)   Resp (!) 23   Ht 5\' 9"  (1.753 m)   Wt 64.6 kg (142 lb 6.7 oz)   SpO2 93%   BMI 21.03 kg/m   General:  Alert and oriented 3, mild distress secondary to trouble breathing    Eyes: Sclera nonicteric, extraocular movements are intact  ENT: Normocephalic, atraumatic, mucous members are slightly dry  Neck: Supple, no JVD  Cardiovascular: Regular rate and rhythm, E9-B2, 2/6 systolic ejection murmur  Respiratory: Fine crackles bibasilar  Abdomen: Soft, nontender, nondistended, positive bowel sounds  Skin: No skin breaks, tears or lesions  Musculoskeletal: No clubbing or cyanosis, trace pitting edema bilaterally from the knees down  Psychiatric: Patient is appropriate, no evidence of psychoses  Neurologic: Decreased sensation in the lower extremities, otherwise no focal deficits           Labs on Admission:  Basic Metabolic Panel: No results for input(s): NA, K, CL, CO2, GLUCOSE, BUN, CREATININE, CALCIUM, MG, PHOS in the last 168 hours. Liver Function Tests: No results for input(s): AST, ALT, ALKPHOS, BILITOT, PROT, ALBUMIN in the last 168 hours. No results for input(s): LIPASE, AMYLASE in the last  168 hours. No results for input(s): AMMONIA in the last 168 hours. CBC:  Recent Labs Lab 03/12/17 1400  WBC 8.4  HGB 9.6*  HCT 28.0*  MCV 77.3*  PLT 275   Cardiac Enzymes: No results for input(s): CKTOTAL, CKMB, CKMBINDEX, TROPONINI in the last 168 hours.  BNP (last 3 results) No results for input(s): BNP in the last 8760 hours.  ProBNP (last 3 results) No results for input(s): PROBNP in the last 8760 hours.  CBG:  Recent Labs Lab 03/12/17 1249  GLUCAP 113*    Radiological Exams on Admission: No results found.  EKG: Independently reviewed.  Pending  Assessment/Plan Present on Admission: . Hyperlipidemia . Hypertension . Hypothyroidism . AKI (acute kidney injury) ([place]) . Depression . CKD (chronic kidney disease), stage III  Principal Problem:  Acute respiratory failure with hypoxia secondary to Acute CHF (congestive heart failure) ([place]): New finding. Echocardiogram already done and results are pending. Suspect systolic. Likely in the  setting of poorly controlled hypertension. However, if patient has marketed decrease in ejection fraction, may need ischemic workup through cardiology. Patient already improving, off of BiPAP  Active Problems:   Hyperlipidemia: Lipid panel ordered for morning   uncontrolled  Hypertension with secondary urgency: Will DC nitroglycerin patch once blood pressure back to normal range. Discussed with pharmacy. Presumed all patient's home medications however, patient herself is not taking her medicines as prescribed. Once her breathing improves, this should also help her blood pressure    Hypothyroidism: Checking TSH, continue Synthroid    Diet-controlled diabetes mellitus ([place]): Checking A1c, sliding scale      Depression: Continue home medications    Acute kidney injury in the setting of CKD (chronic kidney disease), stage III suspect secondary to poor perfusion from CHF, although will improve as we diurese her. Monitor kidney function closely. Underlying chronic kidney disease seems to be more related to hypertension and diabetes   headache: secondary to glycerin patch    Dysphagia sensation: Check swallow eval    DVT prophylaxis: Lovenox   Code Status: Full code as confirmed by patient   Family Communication: Sister at the bedside   Disposition Plan: Anticipate will be here for several days until blood pressure and oxygenation stable   Consults called: None   Admission status: Given need for acute inpatient services and patient is critically ill and will be here well past to midnight, admitted as inpatient     Annita Brod MD [facility] Pager [phone]  If 7PM-7AM, please contact night-coverage [email or web address] Password [facility]  03/12/2017, 3:17 PM

## 2017-03-13 ENCOUNTER — Inpatient Hospital Stay (HOSPITAL_COMMUNITY): Payer: Medicare Other

## 2017-03-13 DIAGNOSIS — I10 Essential (primary) hypertension: Secondary | ICD-10-CM

## 2017-03-13 DIAGNOSIS — D509 Iron deficiency anemia, unspecified: Secondary | ICD-10-CM

## 2017-03-13 DIAGNOSIS — D500 Iron deficiency anemia secondary to blood loss (chronic): Secondary | ICD-10-CM

## 2017-03-13 LAB — LIPID PANEL
Cholesterol: 103 mg/dL (ref 0–200)
HDL: 34 mg/dL — ABNORMAL LOW (ref >40)
LDL Cholesterol: 45 mg/dL (ref 0–99)
Total CHOL/HDL Ratio: 3 RATIO
Triglycerides: 121 mg/dL (ref <150)
VLDL: 24 mg/dL (ref 0–40)

## 2017-03-13 LAB — BASIC METABOLIC PANEL
Anion gap: 10 (ref 5–15)
BUN: 29 mg/dL — ABNORMAL HIGH (ref 6–20)
CO2: 26 mmol/L (ref 22–32)
Calcium: 8.5 mg/dL — ABNORMAL LOW (ref 8.9–10.3)
Chloride: 103 mmol/L (ref 101–111)
Creatinine, Ser: 3.15 mg/dL — ABNORMAL HIGH (ref 0.44–1.00)
GFR calc Af Amer: 17 mL/min — ABNORMAL LOW (ref >60)
GFR calc non Af Amer: 14 mL/min — ABNORMAL LOW (ref >60)
Glucose, Bld: 119 mg/dL — ABNORMAL HIGH (ref 65–99)
Potassium: 3.2 mmol/L — ABNORMAL LOW (ref 3.5–5.1)
Sodium: 139 mmol/L (ref 135–145)

## 2017-03-13 LAB — GLUCOSE, CAPILLARY
Glucose-Capillary: 152 mg/dL — ABNORMAL HIGH (ref 65–99)
Glucose-Capillary: 98 mg/dL (ref 65–99)
Glucose-Capillary: 178 mg/dL — ABNORMAL HIGH (ref 65–99)
Glucose-Capillary: 111 mg/dL — ABNORMAL HIGH (ref 65–99)

## 2017-03-13 LAB — TROPONIN I: Troponin I: 0.04 ng/mL (ref <0.03)

## 2017-03-13 MED ORDER — ALBUTEROL SULFATE (2.5 MG/3ML) 0.083% IN NEBU: 2.5000 mg | INHALATION_SOLUTION | RESPIRATORY_TRACT | Status: DC | PRN

## 2017-03-13 MED ORDER — POTASSIUM CHLORIDE 20 MEQ/15ML (10%) PO SOLN
40.0000 meq | Freq: Once | ORAL | Status: AC
  Administered 2017-03-13: 40 meq via ORAL
  Filled 2017-03-13: qty 30

## 2017-03-13 MED ORDER — IPRATROPIUM-ALBUTEROL 0.5-2.5 (3) MG/3ML IN SOLN
3.0000 mL | Freq: Four times a day (QID) | RESPIRATORY_TRACT | Status: DC
  Administered 2017-03-13 – 2017-03-16 (×13): 3 mL via RESPIRATORY_TRACT
  Filled 2017-03-13 (×13): qty 3

## 2017-03-13 MED ORDER — PREDNISONE 20 MG PO TABS: 40.0000 mg | ORAL_TABLET | Freq: Every day | ORAL | Status: DC

## 2017-03-13 NOTE — Progress Notes (Signed)
PROGRESS NOTE  Jocelyn Hill. Jocelyn Hill:[id number] DOB: 31-Mar-1951 DOA: 03/12/2017 PCP: [name], MD  Brief Narrative: 66 year old woman PMH orally controlled hypertension, chronic kidney disease stage III, MI, diabetes mellitus with peripheral neuropathy, presented with shortness of breath. BNP 1700, chest x-ray showed bilateral pleural effusions. SBP 180-190. Started on BiPAP. Because of suspected heart failure and elevated creatinine, transferred to higher level of care.  Assessment/Plan Acute hypoxic respiratory failure secondary to acute diastolic heart failure in the setting of uncontrolled hypertension and probable undiagnosed COPD. Remains off BiPAP.  -Wean oxygen as tolerated. Continue to treat heart failure, COPD and hypertension.  Acute diastolic congestive heart failure. Possible contribution would be minoxidil which can cause pulmonary edema if not on diuretic. I do not see that she was on a diuretic as an outpatient. -Continue IV diuresis, daily weights, strict I/O -Hold minoxidil. If resumed on discharge, would add diuretic.  Malignant hypertension. -Control improved. Continue amlodipine, Labetalol, hydralazine, minoxidil  Acute kidney injury superimposed on chronic kidney disease stage III. -Suspect secondary to relative hypoperfusion from pulmonary edema. Also consider progression of chronic kidney disease. -Check BMP in a.m. -obtain BMP/records from PCP  Possible COPD, smoker. Suspect COPD exacerbation. Uses albuterol at home. Not usually on oxygen. -Scheduled bronchodilators given poor air movement. When necessary albuterol. Start steroids.  Diabetes mellitus type 2 with peripheral neuropathy. Hemoglobin A1c 6.2. - diet-controlled. Continue sliding scale insulin. Continue Neurontin  Microcytic anemia -Check anemia panel. Consider outpatient evaluation.  Coronary artery disease -Troponins flat, trivial elevation. Secondary to demand ischemia. Asymptomatic. No  further evaluation suggested. -Continue Lipitor, aspirin  Hypothyroidism. TSH within normal limits. -Continue levothyroxine    Still with significant oxygen requirement, short of breath. Keep and stepdown.   DVT prophylaxis: enoxaparin Code Status: full Family Communication: none Disposition Plan: home    Jocelyn Hodgkins, MD  [facility] Direct contact: [phone] --[place]  --[email or web address]; password [email or web address]  7PM-7AM contact night coverage as above 03/13/2017, 9:08 AM  LOS: 1 day   Consultants:    Procedures:  Echo Study Conclusions  - Left ventricle: The cavity size was normal. Wall thickness was   normal. Systolic function was normal. The estimated ejection   fraction was in the range of 60% to 65%. Wall motion was normal;   there were no regional wall motion abnormalities. Features are   consistent with a pseudonormal left ventricular filling pattern,   with concomitant abnormal relaxation and increased filling   pressure (grade 2 diastolic dysfunction). - Mitral valve: There was severe regurgitation. - Left atrium: The atrium was severely dilated. - Right atrium: The atrium was mildly dilated. - Tricuspid valve: There was mild-moderate regurgitation. - Pulmonary arteries: Systolic pressure was moderately increased.   PA peak pressure: 60 mm Hg (S). - Pericardium, extracardiac: A small to moderate pericardial   effusion was identified  Antimicrobials:    Interval history/Subjective: Feels short of breath. Difficult to breathe. Had ankle edema yesterday. No nausea or vomiting. No pain.  Objective: Vitals: Afebrile, 98.3, 22, 50, 161/81, 90% on 8 L.  Exam:     Constitutional: Appears ill but not toxic. Calm. Mildly uncomfortable.  Eyes. Pupils, irises, lids appear unremarkable.  ENT. Grossly normal hearing, lips, tongue.  Respiratory. Poor air movement. No frank wheezes, rales or rhonchi. Mild to moderate increased respiratory  effort. Speaks in short sentences.  Cardiovascular. Regular rate and rhythm. No murmur, rub or gallop. No lower extremity edema. Telemetry sinus rhythm.  Abdomen. Soft, nontender, nondistended.  Psychiatric. Grossly normal mood and affect. Speech fluent and appropriate.   I have personally reviewed the following:  Urine output 1950 Labs:  Blood sugars stable  Potassium 3.2, creatinine slightly improved, 3.15. BUN 29.  Troponins flat, 0.03, 0.04  Hemoglobin 9.6 on admission  Imaging studies: Chest x-ray pulmonary interstitial edema   Scheduled Meds: . amLODipine  10 mg Oral Daily  . aspirin EC  81 mg Oral Daily  . atorvastatin  40 mg Oral Daily  . buPROPion  300 mg Oral Daily  . busPIRone  15 mg Oral Daily  . DULoxetine  30 mg Oral Daily  . enoxaparin (LOVENOX) injection  30 mg Subcutaneous Q24H  . furosemide  40 mg Intravenous BID  . gabapentin  300 mg Oral QODAY  . hydrALAZINE  100 mg Oral TID  . insulin aspart  0-9 Units Subcutaneous TID WC  . ipratropium-albuterol  3 mL Nebulization QID  . labetalol  200 mg Oral BID  . levothyroxine  112 mcg Oral QAC breakfast  . mouth rinse  15 mL Mouth Rinse BID  . montelukast  10 mg Oral QHS  . pantoprazole  40 mg Oral Daily  . [START ON 03/14/2017] predniSONE  40 mg Oral Q breakfast  . pregabalin  150 mg Oral QODAY  . sodium chloride flush  3 mL Intravenous Q12H  . tiZANidine  4 mg Oral TID   Continuous Infusions: . sodium chloride      Principal Problem:   Acute on chronic respiratory failure with hypoxia (H[facility]) Active Problems:   Hypertension   Hypothyroidism   Diet-controlled diabetes mellitus (H[facility])   Acute diastolic (congestive) heart failure (H[facility])   AKI (acute kidney injury) ([place])   CKD (chronic kidney disease), stage III   Dysphagia   Malignant hypertension   Microcytic anemia   LOS: 1 day

## 2017-03-13 NOTE — Evaluation (Signed)
Clinical/Bedside Swallow Evaluation Patient Details  Name: Jocelyn Hill. Nam MRN: [id number] Date of Birth: [birth year]  Today's Date: 03/13/2017 Time: SLP Start Time (ACUTE ONLY): 0930 SLP Stop Time (ACUTE ONLY): 1000 SLP Time Calculation (min) (ACUTE ONLY): 30 min  Past Medical History:  Past Medical History:  Diagnosis Date  . Anxiety   . Arthritis   . Bronchitis   . Depression   . Diabetes mellitus without complication ([place])   . Diet-controlled diabetes mellitus ([place])   . GERD (gastroesophageal reflux disease)   . Hyperlipidemia   . Hypertension   . Hypothyroidism   . Stroke [facility])    Past Surgical History:  Past Surgical History:  Procedure Laterality Date  . ABDOMINAL HYSTERECTOMY     PARTIALS  . TONSILLECTOMY    . TUBAL LIGATION     HPI:  66 yo female adm to [facility] with difficulty breathing and weakness. PMH + for CVA approximately 5 years ago per pt, GERD, MI, peripheral neuropathy, HTN, CKD.  Pt informed MD of dysphagia - sensing food lodging and recent dental loss.  She admits to this SLP that she has lost weight over the last year = 155-130s.  Pt states dysphagia has been ongoing for a "long time" = six months but worsening.  Intake "comes back up" and pt admits to occasionally choking on "emesis".  Pt admits to having endoscopy done approximately 10 years ago but does not recall if she was dilatated - she states swallow improved after endo and she is having similar symptoms currently.  She takes her protonix religiously per her statement.    Assessment / Plan / Recommendation Clinical Impression  Pt presents with symptoms consistent with esophageal dysphagia including discomfort with swallowing (with cold water during evaluation- warm coffee tolerated well) and globus sensation.  SlP only observed pt with liquids due to her excessive discomfort/chest pain with po.    No cranial nerve deficits impacting swallowing noted and pt denies dysphagia after her stoke 5 years ago.  Pt  admits to esophageal symptoms "for years" but worsening.  No indication of significant oropharyngeal dysphagia.  Xerostomia, poor dentition impact her swallowing however.   Recent "rash" on the roof of the mouth reported by this pt causing worsening discomfort within the last week.  Pt admits to taking a few antibiotic pills and states this improved her symptoms.  SLP ?s if pt could have oral candidiasis, reflux and possible dysmotility issues based on her symptoms.     Provided pt with information re: po with respiratory deficits, esophageal precautions, xerostomia tips verbally and in writing using teach back.    Dentist follow up as OP needed to help manage dentition/maxillary post.  Advised pt to follow up with GI as OP (GI scoped her 10 years ago) to help with chronic dysphagia.  Recommended consider room temp, warm liquids and small frequent meals if decreases symptoms.    No SLP follow up indicated.  Thanks.    SLP Visit Diagnosis: Dysphagia, unspecified (R13.10)    Aspiration Risk  Mild aspiration risk    Diet Recommendation Thin liquid;Regular (as tolerated)   Liquid Administration via: Cup;Straw Medication Administration: Whole meds with liquid Supervision: Patient able to self feed Compensations: Slow rate;Small sips/bites;Follow solids with liquid (start with liquids) Postural Changes: Remain upright for at least 30 minutes after po intake;Seated upright at 90 degrees    Other  Recommendations Recommended Consults: Other (Comment) (recommend follow up with GI as OP) Oral Care Recommendations: Oral care BID  Follow up Recommendations None      Frequency and Duration     n/a       Prognosis   n/a     Swallow Study   General Date of Onset: 03/13/17 HPI: 66 yo female adm to [facility] with difficulty breathing and weakness. PMH + for CVA approximately 5 years ago per pt, GERD, MI, peripheral neuropathy, HTN, CKD.  Pt informed MD of dysphagia - sensing food lodging and recent  dental loss.  She admits to this SLP that she has lost weight over the last year = 155-130s.  Pt states dysphagia has been ongoing for a "long time" = six months but worsening.  Intake "comes back up" and pt admits to occasionally choking on "emesis".  Pt admits to having endoscopy done approximately 10 years ago but does not recall if she was dilatated - she states swallow improved after endo and she is having similar symptoms currently.  She takes her protonix religiously per her statement.  Type of Study: Bedside Swallow Evaluation Diet Prior to this Study: Regular;Thin liquids Temperature Spikes Noted: No Respiratory Status: Other (comment) (HFNC 10) History of Recent Intubation: No Behavior/Cognition: Alert;Cooperative;Pleasant mood Oral Cavity Assessment: Dry;Other (comment) (pt reports recent rash on roof of mouth with discomfort, examination unremarkable) Oral Care Completed by SLP: No Oral Cavity - Dentition: Missing dentition;Other (Comment) (post in oral cavity without tooth, many teeth missing, poor conditiion) Vision: Functional for self-feeding Self-Feeding Abilities: Able to feed self Patient Positioning: Upright in bed Baseline Vocal Quality: Other (comment) (mildly weak) Volitional Cough: Strong Volitional Swallow: Unable to elicit    Oral/Motor/Sensory Function Overall Oral Motor/Sensory Function: Within functional limits   Ice Chips Ice chips: Not tested   Thin Liquid Thin Liquid: Within functional limits Presentation: Self Fed;Cup;Straw Other Comments: coffee and water    Nectar Thick Nectar Thick Liquid: Not tested   Honey Thick Honey Thick Liquid: Not tested   Puree Puree: Not tested   Solid   GO   Solid: Not tested        Jocelyn Hill 03/13/2017,10:24 AM  Luanna Salk, [place] [facility] SLP [phone]

## 2017-03-14 DIAGNOSIS — I5033 Acute on chronic diastolic (congestive) heart failure: Secondary | ICD-10-CM

## 2017-03-14 LAB — IRON AND TIBC
Iron: 19 ug/dL — ABNORMAL LOW (ref 28–170)
Saturation Ratios: 8 % — ABNORMAL LOW (ref 10.4–31.8)
TIBC: 228 ug/dL — ABNORMAL LOW (ref 250–450)
UIBC: 209 ug/dL

## 2017-03-14 LAB — GLUCOSE, CAPILLARY
Glucose-Capillary: 132 mg/dL — ABNORMAL HIGH (ref 65–99)
Glucose-Capillary: 145 mg/dL — ABNORMAL HIGH (ref 65–99)
Glucose-Capillary: 180 mg/dL — ABNORMAL HIGH (ref 65–99)
Glucose-Capillary: 201 mg/dL — ABNORMAL HIGH (ref 65–99)

## 2017-03-14 LAB — FERRITIN: Ferritin: 41 ng/mL (ref 11–307)

## 2017-03-14 LAB — BASIC METABOLIC PANEL
Anion gap: 5 (ref 5–15)
BUN: 32 mg/dL — ABNORMAL HIGH (ref 6–20)
CO2: 30 mmol/L (ref 22–32)
Calcium: 8.7 mg/dL — ABNORMAL LOW (ref 8.9–10.3)
Chloride: 104 mmol/L (ref 101–111)
Creatinine, Ser: 3.52 mg/dL — ABNORMAL HIGH (ref 0.44–1.00)
GFR calc Af Amer: 15 mL/min — ABNORMAL LOW (ref >60)
GFR calc non Af Amer: 13 mL/min — ABNORMAL LOW (ref >60)
Glucose, Bld: 124 mg/dL — ABNORMAL HIGH (ref 65–99)
Potassium: 3.6 mmol/L (ref 3.5–5.1)
Sodium: 139 mmol/L (ref 135–145)

## 2017-03-14 LAB — TROPONIN I
Troponin I: 0.03 ng/mL (ref <0.03)
Troponin I: 0.03 ng/mL (ref <0.03)

## 2017-03-14 LAB — HIV ANTIBODY (ROUTINE TESTING W REFLEX): HIV Screen 4th Generation wRfx: NONREACTIVE

## 2017-03-14 LAB — FOLATE: Folate: 11.4 ng/mL (ref >5.9)

## 2017-03-14 LAB — VITAMIN B12: Vitamin B-12: 1193 pg/mL — ABNORMAL HIGH (ref 180–914)

## 2017-03-14 LAB — MAGNESIUM: Magnesium: 1.7 mg/dL (ref 1.7–2.4)

## 2017-03-14 MED ORDER — METHYLPREDNISOLONE SODIUM SUCC 125 MG IJ SOLR
80.0000 mg | Freq: Three times a day (TID) | INTRAMUSCULAR | Status: DC
  Administered 2017-03-14 – 2017-03-15 (×3): 80 mg via INTRAVENOUS
  Filled 2017-03-14 (×3): qty 2

## 2017-03-14 MED ORDER — FAMOTIDINE 20 MG PO TABS
20.0000 mg | ORAL_TABLET | Freq: Every day | ORAL | Status: DC | PRN
  Administered 2017-03-14: 20 mg via ORAL
  Filled 2017-03-14: qty 1

## 2017-03-14 MED ORDER — POTASSIUM CHLORIDE CRYS ER 20 MEQ PO TBCR
40.0000 meq | EXTENDED_RELEASE_TABLET | Freq: Once | ORAL | Status: AC
  Administered 2017-03-14: 40 meq via ORAL
  Filled 2017-03-14: qty 2

## 2017-03-14 MED ORDER — HYDRALAZINE HCL 20 MG/ML IJ SOLN: 10.0000 mg | Freq: Four times a day (QID) | INTRAMUSCULAR | Status: DC | PRN

## 2017-03-14 MED ORDER — NITROGLYCERIN 2 % TD OINT
0.5000 [in_us] | TOPICAL_OINTMENT | Freq: Three times a day (TID) | TRANSDERMAL | Status: DC
  Administered 2017-03-14 – 2017-03-15 (×2): 0.5 [in_us] via TOPICAL
  Filled 2017-03-14: qty 30

## 2017-03-14 MED ORDER — FUROSEMIDE 10 MG/ML IJ SOLN
40.0000 mg | Freq: Three times a day (TID) | INTRAMUSCULAR | Status: DC
  Administered 2017-03-14 – 2017-03-16 (×6): 40 mg via INTRAVENOUS
  Filled 2017-03-14 (×6): qty 4

## 2017-03-14 MED ORDER — NITROGLYCERIN 2 % TD OINT
1.0000 [in_us] | TOPICAL_OINTMENT | Freq: Four times a day (QID) | TRANSDERMAL | Status: DC
  Filled 2017-03-14: qty 30

## 2017-03-14 MED ORDER — NITROGLYCERIN 2 % TD OINT
0.5000 [in_us] | TOPICAL_OINTMENT | Freq: Three times a day (TID) | TRANSDERMAL | Status: DC
  Filled 2017-03-14: qty 0.5

## 2017-03-14 MED ORDER — FUROSEMIDE 10 MG/ML IJ SOLN
40.0000 mg | Freq: Three times a day (TID) | INTRAMUSCULAR | Status: DC
Start: 2017-03-14 — End: 2017-03-14

## 2017-03-14 MED ORDER — DEXTROSE 5 % IV SOLN
500.0000 mg | INTRAVENOUS | Status: DC
  Administered 2017-03-14 – 2017-03-15 (×2): 500 mg via INTRAVENOUS
  Filled 2017-03-14 (×3): qty 500

## 2017-03-14 MED ORDER — AMLODIPINE BESYLATE 5 MG PO TABS
5.0000 mg | ORAL_TABLET | Freq: Every day | ORAL | Status: DC
  Administered 2017-03-15: 5 mg via ORAL
  Filled 2017-03-14: qty 1

## 2017-03-14 MED ORDER — FUROSEMIDE 10 MG/ML IJ SOLN: 40.0000 mg | Freq: Two times a day (BID) | INTRAMUSCULAR | Status: DC

## 2017-03-14 MED ORDER — HYDRALAZINE HCL 25 MG PO TABS: 25.0000 mg | ORAL_TABLET | Freq: Three times a day (TID) | ORAL | Status: DC

## 2017-03-14 MED ORDER — HYDRALAZINE HCL 50 MG PO TABS
50.0000 mg | ORAL_TABLET | Freq: Three times a day (TID) | ORAL | Status: DC
  Administered 2017-03-14 – 2017-03-15 (×3): 50 mg via ORAL
  Filled 2017-03-14 (×3): qty 1

## 2017-03-14 NOTE — Consult Note (Signed)
Primary cardiologist: n/a Consulting cardiologist: Dr Carlyle Dolly Requesting physicinan: Dr Maudie Mercury Indication: severe mitral regurgitation, acute diastolic heart failure  Clinical Summary Jocelyn Hill is a 66 y.o.female history of HTN, CKD 3, DM2, hypothryoidism admitted with SOB. She reports gradual onset of symptoms over the last several weeks. Denies any significant chest pain. Seen initially in [place] ER, started on bipap. Transferred to [facility].    Admit labs WBC 8.4, Hgb 9.6, Plt 275, BNP 2230, TSH 1.3, K 3.2, Cr 3.29 CXR pulm edema Trop 0.03-->0.03-->0.04 Echo 02/2017: LVEF 60-65%, no WMAs, grade II diastolic dysfunction, severe MR, severe LAE, mild to mod TR, PASP 60, small to moderate pericardial effusion EKG sinus brady, LAE, possible anteroseptal infarct   Allergies  Allergen Reactions  . Ace Inhibitors Anaphylaxis and Swelling    Medications Scheduled Medications: . amLODipine  10 mg Oral Daily  . aspirin EC  81 mg Oral Daily  . atorvastatin  40 mg Oral Daily  . buPROPion  300 mg Oral Daily  . busPIRone  15 mg Oral Daily  . DULoxetine  30 mg Oral Daily  . enoxaparin (LOVENOX) injection  30 mg Subcutaneous Q24H  . furosemide  40 mg Intravenous BID  . gabapentin  300 mg Oral QODAY  . hydrALAZINE  100 mg Oral TID  . insulin aspart  0-9 Units Subcutaneous TID WC  . ipratropium-albuterol  3 mL Nebulization QID  . labetalol  200 mg Oral BID  . levothyroxine  112 mcg Oral QAC breakfast  . mouth rinse  15 mL Mouth Rinse BID  . methylPREDNISolone (SOLU-MEDROL) injection  80 mg Intravenous Q8H  . montelukast  10 mg Oral QHS  . pantoprazole  40 mg Oral Daily  . pregabalin  150 mg Oral QODAY  . sodium chloride flush  3 mL Intravenous Q12H  . tiZANidine  4 mg Oral TID     Infusions: . sodium chloride    . azithromycin       PRN Medications:  sodium chloride, acetaminophen **OR** acetaminophen, albuterol, ALPRAZolam, hydrALAZINE, oxyCODONE,  polyethylene glycol, sodium chloride flush, traMADol, zolpidem   Past Medical History:  Diagnosis Date  . Anxiety   . Arthritis   . Bronchitis   . Depression   . Diabetes mellitus without complication ([place])   . Diet-controlled diabetes mellitus ([place])   . GERD (gastroesophageal reflux disease)   . Hyperlipidemia   . Hypertension   . Hypothyroidism   . Stroke [facility])     Past Surgical History:  Procedure Laterality Date  . ABDOMINAL HYSTERECTOMY     PARTIALS  . TONSILLECTOMY    . TUBAL LIGATION      History reviewed. No pertinent family history.  Social History Jocelyn Hill reports that she has been smoking Cigarettes.  She has never used smokeless tobacco. Jocelyn Hill reports that she does not drink alcohol.  Review of Systems CONSTITUTIONAL: No weight loss, fever, chills, weakness or fatigue.  HEENT: Eyes: No visual loss, blurred vision, double vision or yellow sclerae. No hearing loss, sneezing, congestion, runny nose or sore throat.  SKIN: No rash or itching.  CARDIOVASCULAR: no chest pain, no palpitations RESPIRATORY: +SOB GASTROINTESTINAL: No anorexia, nausea, vomiting or diarrhea. No abdominal pain or blood.  GENITOURINARY: no polyuria, no dysuria NEUROLOGICAL: No headache, dizziness, syncope, paralysis, ataxia, numbness or tingling in the extremities. No change in bowel or bladder control.  MUSCULOSKELETAL: No muscle, back pain, joint pain or stiffness.  HEMATOLOGIC: No anemia, bleeding or bruising.  LYMPHATICS: No enlarged nodes. No history of splenectomy.  PSYCHIATRIC: No history of depression or anxiety.      Physical Examination Blood pressure (!) 150/77, pulse 63, temperature 98.1 F (36.7 C), temperature source Oral, resp. rate (!) 22, height 5\' 9"  (1.753 m), weight 146 lb 2.6 oz (66.3 kg), SpO2 93 %.  Intake/Output Summary (Last 24 hours) at 03/14/17 0926 Last data filed at 03/14/17 0400  Gross per 24 hour  Intake              480 ml  Output              1650 ml  Net            -1170 ml    HEENT: sclera clear, throat clear  Cardiovascular: RRR, 3/6 systolic murmur at apex, elevated JVD  Respiratory: bilateral crackles  GI: abdomen soft, NT, ND  MSK: no LE edema  Neuro: no focal deficits  Psych: appropriate affect   Lab Results  Basic Metabolic Panel:  Recent Labs Lab 03/12/17 1400 03/13/17 0202 03/14/17 0320  NA  --  139 139  K  --  3.2* 3.6  CL  --  103 104  CO2  --  26 30  GLUCOSE  --  119* 124*  BUN  --  29* 32*  CREATININE 3.29* 3.15* 3.52*  CALCIUM  --  8.5* 8.7*    Liver Function Tests: No results for input(s): AST, ALT, ALKPHOS, BILITOT, PROT, ALBUMIN in the last 168 hours.  CBC:  Recent Labs Lab 03/12/17 1400  WBC 8.4  HGB 9.6*  HCT 28.0*  MCV 77.3*  PLT 275    Cardiac Enzymes:  Recent Labs Lab 03/12/17 1400 03/12/17 1952 03/13/17 0202  TROPONINI 0.03* 0.03* 0.04*    BNP: Invalid input(s): POCBNP   ECG   Imaging   Impression/Recommendations  1. Acute diastolic HF - echo LVEF 62-69%, grade II diastolic dysfunction - complicated by severe MR and renal dysfunction - negative 1.1 liters yesterday, negative 3.1 liters since admission. Uptrend in Cr. She is on lasix 40mg  IV bid.  - remains volume overloaded by exam, back on bipap this AM. Increase lasix to 40mg  IV tid   2. Mitral regurgitation - described as severe by echo. LVEF 60-65%, LVIDs 44, PASP 60.  - will review study, may require TEE to further evaluate once respiratory status improved. At this time unclear of mechanism for her MR. Based on presentation appears symptomatic, based on LVIDs evidence of LV dilatation, may need consideration for MV intervention. Renal function will be an issue if needs cath as part of workup - renew NG patch, continue hydralazine for additional afterload reduction   3. HTN - notes indicate history of poorly controlled HTN, patient noncompliant with home meds - she had evidence of end  organ damage with diastolic HF, renal dysfunction - bp's improving on current regiment, contniue to follow, should come down with further diuresis as well.    4. CKD III-IV - H&P reports baselien Cr of 2.1, unclear where this result was found, do not see in epic. Unclear to me how much is chronic vs acute.  - consider renal consult if don't have significant improvement with diuresis  5. PVCs - K 3.6, Mg pending - give KCl 40, f/u Mg results. Keep K at 4 Mg at 2 - continue beta blocker    Carlyle Dolly, M.D.

## 2017-03-14 NOTE — Progress Notes (Signed)
Pt currently off BIPAP and on 15 LPM [place] .  Pt tolerating well at this time, RT to monitor and assess as needed.

## 2017-03-14 NOTE — Progress Notes (Signed)
Patient ID: Jocelyn Hill. Jocelyn Hill, female   DOB: 18-Nov-1950, 66 y.o.   MRN: [id number]                                                                PROGRESS NOTE                                                                                                                                                                                                             Patient Demographics:    Jocelyn Hill, is a 66 y.o. female, DOB - September 25, 1950, JJH:[id number]  Admit date - 03/12/2017   Admitting Physician Annita Brod, MD  Outpatient Primary MD for the patient is Antil, Venia Carbon, MD  LOS - 2  Outpatient Specialists:     No chief complaint on file.    Dyspnea  Brief Narrative  66 year old woman PMH orally controlled hypertension, chronic kidney disease stage III, MI, diabetes mellitus with peripheral neuropathy, presented with shortness of breath. BNP 1700, chest x-ray showed bilateral pleural effusions. SBP 180-190. Started on BiPAP. Because of suspected heart failure and elevated creatinine, transferred to higher level of care.   Subjective:    Jocelyn Hill today has slight sharp chest pain across her chest since last nite.  Pt states given tramadol, still has dicomfort. Sob slightly worse since yesterday but better than admission.   No headache,  No abdominal pain - No Nausea, No new weakness tingling or numbness,    Assessment  & Plan :    Principal Problem:   Acute on chronic respiratory failure with hypoxia (HCC) Active Problems:   Hypertension   Hypothyroidism   Diet-controlled diabetes mellitus (HCC)   Acute diastolic (congestive) heart failure (HCC)   AKI (acute kidney injury) ([place])   CKD (chronic kidney disease), stage III   Dysphagia   Malignant hypertension   Microcytic anemia   Severe MR Cardiology consulted, appreciate input  Acute hypoxic respiratory failure secondary to acute diastolic heart failure in the setting of uncontrolled hypertension and probable  undiagnosed COPD. Restart bipap due to increase in dyspnea.  Agree w assessment by prior physician May have Copd component ,  will tx with solumedrol 80mg  iv q8, and zithromax 500mg  iv qday But most of her dyspnea is probably from her CHF and severe MR CXR  today   Acute diastolic congestive heart failure. Possible contribution would be minoxidil which can cause pulmonary edema if not on diuretic. I do not see that she was on a diuretic as an outpatient. -Continue IV diuresis, daily weights, strict I/O -Hold minoxidil. If resumed on discharge, would add diuretic.  Malignant hypertension. -Control improved. Continue amlodipine, Labetalol, hydralazine, minoxidil  Acute kidney injury superimposed on chronic kidney disease stage III. -Suspect secondary to relative hypoperfusion from pulmonary edema. Also consider progression of chronic kidney disease. -Check cmp in am  Possible COPD, smoker. Suspect COPD exacerbation. Uses albuterol at home. Not usually on oxygen. -Scheduled bronchodilators given poor air movement. When necessary albuterol  Add solumedrol / zithromax   Diabetes mellitus type 2 with peripheral neuropathy. Hemoglobin A1c 6.2. - diet-controlled. Continue sliding scale insulin. Continue Neurontin  Microcytic anemia -Check anemia panel. Consider outpatient evaluation.  Coronary artery disease -Troponins flat, trivial elevation. Secondary to demand ischemia. Asymptomatic. No further evaluation suggested. -Continue Lipitor, aspirin  Hypothyroidism. TSH within normal limits. -Continue levothyroxine    Code Status : FULL CODE  Family Communication  : w patient  Disposition Plan  : continue stepdown  Barriers For Discharge :   Consults  :  cardiology  Procedures  : echo 9/13  DVT Prophylaxis  :  Lovenox -SCDs   Lab Results  Component Value Date   PLT 275 03/12/2017    Antibiotics  :    Anti-infectives    None        Objective:   Vitals:    03/14/17 0425 03/14/17 0500 03/14/17 0600 03/14/17 0700  BP:  128/65 137/63 (!) 150/77  Pulse:  (!) 59 60 63  Resp:  18 18 (!) 22  Temp:      TempSrc:      SpO2:  96% 94% 93%  Weight: 66.3 kg (146 lb 2.6 oz)     Height:        Wt Readings from Last 3 Encounters:  03/14/17 66.3 kg (146 lb 2.6 oz)     Intake/Output Summary (Last 24 hours) at 03/14/17 0726 Last data filed at 03/14/17 0400  Gross per 24 hour  Intake              600 ml  Output             1750 ml  Net            -1150 ml     Physical Exam  Awake Alert, Oriented X 3, No new F.N deficits, Normal affect [place].AT,PERRAL Supple Neck,   +  JVD, No cervical lymphadenopathy appriciated.  Heart: rrr s1, s2 2/6 sem apex Lung:   slight decrease in bs at right lung base, slight crackle left lung base, slight exp wheezing Abd: +ve B.Sounds, Abd Soft, No tenderness, No organomegaly appriciated, No rebound - guarding or rigidity. No Cyanosis, Clubbing or edema, No new Rash or bruise      Data Review:    CBC  Recent Labs Lab 03/12/17 1400  WBC 8.4  HGB 9.6*  HCT 28.0*  PLT 275  MCV 77.3*  MCH 26.5  MCHC 34.3  RDW 14.1    Chemistries   Recent Labs Lab 03/12/17 1400 03/13/17 0202 03/14/17 0320  NA  --  139 139  K  --  3.2* 3.6  CL  --  103 104  CO2  --  26 30  GLUCOSE  --  119* 124*  BUN  --  29* 32*  CREATININE 3.29* 3.15* 3.52*  CALCIUM  --  8.5* 8.7*   ------------------------------------------------------------------------------------------------------------------  Recent Labs  03/13/17 0202  CHOL 103  HDL 34*  LDLCALC 45  TRIG 121  CHOLHDL 3.0    Lab Results  Component Value Date   HGBA1C 6.2 (H) 03/12/2017   ------------------------------------------------------------------------------------------------------------------  Recent Labs  03/12/17 1400  TSH 1.385    ------------------------------------------------------------------------------------------------------------------ No results for input(s): VITAMINB12, FOLATE, FERRITIN, TIBC, IRON, RETICCTPCT in the last 72 hours.  Coagulation profile No results for input(s): INR, PROTIME in the last 168 hours.  No results for input(s): DDIMER in the last 72 hours.  Cardiac Enzymes  Recent Labs Lab 03/12/17 1400 03/12/17 1952 03/13/17 0202  TROPONINI 0.03* 0.03* 0.04*   ------------------------------------------------------------------------------------------------------------------    Component Value Date/Time   BNP 2,230.5 (H) 03/12/2017 1400    Inpatient Medications  Scheduled Meds: . amLODipine  10 mg Oral Daily  . aspirin EC  81 mg Oral Daily  . atorvastatin  40 mg Oral Daily  . buPROPion  300 mg Oral Daily  . busPIRone  15 mg Oral Daily  . DULoxetine  30 mg Oral Daily  . enoxaparin (LOVENOX) injection  30 mg Subcutaneous Q24H  . furosemide  40 mg Intravenous BID  . gabapentin  300 mg Oral QODAY  . hydrALAZINE  100 mg Oral TID  . insulin aspart  0-9 Units Subcutaneous TID WC  . ipratropium-albuterol  3 mL Nebulization QID  . labetalol  200 mg Oral BID  . levothyroxine  112 mcg Oral QAC breakfast  . mouth rinse  15 mL Mouth Rinse BID  . montelukast  10 mg Oral QHS  . pantoprazole  40 mg Oral Daily  . predniSONE  40 mg Oral Q breakfast  . pregabalin  150 mg Oral QODAY  . sodium chloride flush  3 mL Intravenous Q12H  . tiZANidine  4 mg Oral TID   Continuous Infusions: . sodium chloride     PRN Meds:.sodium chloride, acetaminophen **OR** acetaminophen, albuterol, ALPRAZolam, hydrALAZINE, oxyCODONE, polyethylene glycol, sodium chloride flush, traMADol, zolpidem  Micro Results Recent Results (from the past 240 hour(s))  MRSA PCR Screening     Status: None   Collection Time: 03/12/17  9:52 AM  Result Value Ref Range Status   MRSA by PCR NEGATIVE NEGATIVE Final    Comment:         The GeneXpert MRSA Assay (FDA approved for NASAL specimens only), is one component of a comprehensive MRSA colonization surveillance program. It is not intended to diagnose MRSA infection nor to guide or monitor treatment for MRSA infections.     Radiology Reports Dg Chest Port 1 View  Result Date: 03/13/2017 CLINICAL DATA:  CHF. EXAM: PORTABLE CHEST 1 VIEW COMPARISON:  03/12/2017. FINDINGS: Cardiomegaly with bilateral from interstitial prominence and bilateral pleural effusions consistent CHF. No change prior exam. No evidence of pneumothorax. No acute bony abnormality. IMPRESSION: Congestive heart failure with pulmonary interstitial edema and bilateral pleural effusions. No change from prior exam. Electronically Signed   By: Marcello Moores  Register   On: 03/13/2017 06:23    Time Spent in minutes  30   Jocelyn Hill M.D on 03/14/2017 at 7:26 AM  Between 7am to 7pm - Pager - [phone]  After 7pm go to [email or web address] - password [email or web address]  [facility] -  Office  [phone]

## 2017-03-14 NOTE — Progress Notes (Signed)
Pt continues to complain with SOD. Pt doesn't appear to be in distress. The nurse stated that the Pt's SATS dropped and when she was complaning with SOB and she increased her FIO2 to 70%. The wave form is not consistant. RT increased her IPAP to 13 and her EPAP to 7 and decreased her FIO2 to 50%. The Pt's SAT was 94% on these settings with a good waveform. If we continue to have issues with the Pulse ox RT will try to place one on the Pt's ear. RT will continue to monitor.

## 2017-03-15 LAB — TROPONIN I: Troponin I: <0.03 ng/mL (ref <0.03)

## 2017-03-15 LAB — CBC
HCT: 26.9 % — ABNORMAL LOW (ref 36.0–46.0)
Hemoglobin: 9.4 g/dL — ABNORMAL LOW (ref 12.0–15.0)
MCH: 26.8 pg (ref 26.0–34.0)
MCHC: 34.9 g/dL (ref 30.0–36.0)
MCV: 76.6 fL — ABNORMAL LOW (ref 78.0–100.0)
Platelets: 236 10*3/uL (ref 150–400)
RBC: 3.51 MIL/uL — ABNORMAL LOW (ref 3.87–5.11)
RDW: 13.8 % (ref 11.5–15.5)
WBC: 6.2 10*3/uL (ref 4.0–10.5)

## 2017-03-15 LAB — GLUCOSE, CAPILLARY
Glucose-Capillary: 308 mg/dL — ABNORMAL HIGH (ref 65–99)
Glucose-Capillary: 242 mg/dL — ABNORMAL HIGH (ref 65–99)
Glucose-Capillary: 234 mg/dL — ABNORMAL HIGH (ref 65–99)
Glucose-Capillary: 262 mg/dL — ABNORMAL HIGH (ref 65–99)

## 2017-03-15 LAB — COMPREHENSIVE METABOLIC PANEL
ALT: 23 U/L (ref 14–54)
AST: 18 U/L (ref 15–41)
Albumin: 3 g/dL — ABNORMAL LOW (ref 3.5–5.0)
Alkaline Phosphatase: 83 U/L (ref 38–126)
Anion gap: 12 (ref 5–15)
BUN: 37 mg/dL — ABNORMAL HIGH (ref 6–20)
CO2: 22 mmol/L (ref 22–32)
Calcium: 8.7 mg/dL — ABNORMAL LOW (ref 8.9–10.3)
Chloride: 100 mmol/L — ABNORMAL LOW (ref 101–111)
Creatinine, Ser: 3.53 mg/dL — ABNORMAL HIGH (ref 0.44–1.00)
GFR calc Af Amer: 15 mL/min — ABNORMAL LOW (ref >60)
GFR calc non Af Amer: 13 mL/min — ABNORMAL LOW (ref >60)
Glucose, Bld: 165 mg/dL — ABNORMAL HIGH (ref 65–99)
Potassium: 4.3 mmol/L (ref 3.5–5.1)
Sodium: 134 mmol/L — ABNORMAL LOW (ref 135–145)
Total Bilirubin: 0.9 mg/dL (ref 0.3–1.2)
Total Protein: 5.7 g/dL — ABNORMAL LOW (ref 6.5–8.1)

## 2017-03-15 LAB — MAGNESIUM: Magnesium: 1.7 mg/dL (ref 1.7–2.4)

## 2017-03-15 LAB — MRSA PCR SCREENING: MRSA by PCR: NEGATIVE

## 2017-03-15 MED ORDER — HYDRALAZINE HCL 25 MG PO TABS
25.0000 mg | ORAL_TABLET | Freq: Three times a day (TID) | ORAL | Status: DC
  Administered 2017-03-15 – 2017-03-16 (×2): 25 mg via ORAL
  Filled 2017-03-15 (×2): qty 1

## 2017-03-15 MED ORDER — AMLODIPINE BESYLATE 2.5 MG PO TABS
2.5000 mg | ORAL_TABLET | Freq: Two times a day (BID) | ORAL | Status: DC
  Administered 2017-03-16: 2.5 mg via ORAL
  Filled 2017-03-15: qty 1

## 2017-03-15 MED ORDER — PREDNISONE 20 MG PO TABS
60.0000 mg | ORAL_TABLET | Freq: Every day | ORAL | Status: DC
  Administered 2017-03-15: 60 mg via ORAL
  Filled 2017-03-15: qty 3

## 2017-03-15 NOTE — Progress Notes (Addendum)
Patient ID: Jocelyn Hill. Quant, female   DOB: [birth year], 66 y.o.   MRN: [id number]                                                                PROGRESS NOTE                                                                                                                                                                                                             Patient Demographics:    Jocelyn Hill, is a 66 y.o. female, DOB - [birth year], YOV:[id number]  Admit date - 03/12/2017   Admitting Physician Annita Brod, MD  Outpatient Primary MD for the patient is Antil, Venia Carbon, MD  LOS - 3  Outpatient Specialists:    Hollie Salk (nephrology)  No chief complaint on file.    dyspnea  Brief Narrative  66 year old woman PMH orally controlled hypertension, chronic kidney disease stage III, MI, diabetes mellitus with peripheral neuropathy, presented with shortness of breath. BNP 1700, chest x-ray showed bilateral pleural effusions. SBP 180-190. Started on BiPAP. Because of suspected heart failure and elevated creatinine, transferred to higher level of care.   Hospital course:  Pt admitted on 9/13 and started on diuresis w lasix 40mg  iv bid.  Pt tx with prednisone for ? Copd exacerbation.  Pt thought to have dyspnea secondary to CHF, /Copd.   Cardiac echo on 9/13 => EF 60-65%, and severe MR.  Cardiology consulted.  Pt required bipap for brief episodes on 9/15, and her lasix was increased to 40mg  iv tid.   Pt briefly hypotensive in 9/15 am.  Her bp medication adjusted.  Creatinine remains at about 3.5   Subjective:    Jocelyn Hill today feels her breathing is better.  Pt has been on iv lasix which was increased by cardiology 9/15,  Pt had brief episode of hypotension yesterday and her bp medications adjusted. Slight chest discomfort still this am. Trop 0.03.  Cardiology following.  Trying to diurese so can have TEE to further evaluate MR  No headache, No abdominal pain - No Nausea, No new weakness  tingling or numbness,    Assessment  & Plan :    Principal Problem:   Acute on chronic respiratory failure with hypoxia (HCC) Active Problems:   Hypertension   Hypothyroidism  Diet-controlled diabetes mellitus (HCC)   Acute diastolic (congestive) heart failure (HCC)   AKI (acute kidney injury) (HCC)   CKD (chronic kidney disease), stage III   CHF (congestive heart failure) ([place])   Dysphagia   Malignant hypertension   Microcytic anemia   Severe MR Cardiology consulted, appreciate input Because the patient will eventually need TEE and CT surgery consult , will transfer to [facility]   Acute hypoxic respiratory failuresecondary to acute diastolic heart failure in the setting of uncontrolled hypertensionand probable undiagnosed COPD. Restart bipap due to increase in dyspnea.  Agree w assessment by prior physician May have Copd component ,  will tx with solumedrol 80mg  iv q8, and zithromax 500mg  iv qday (started 9/15)=> prednisone 9/16 But most of her dyspnea is probably from her CHF and severe MR CXR   Acute diastolic congestive heart failure.Possible contribution would be minoxidil which can cause pulmonary edema if not on diuretic. I do not see that she was on a diuretic as an outpatient. -Continue IV diuresis, daily weights, strict I/O -Hold minoxidil. If resumed on discharge, would add diuretic.  Malignant hypertension., hypotensive on 9/15 am Decreased amlodipine to 5mg  po qday,  Decrease hydralazine to 50mg  po tid Cont labetalol, cont ntp  Acute kidney injurysuperimposed on chronic kidney disease stage III. -Suspect secondary to relative hypoperfusion from pulmonary edema. Also consider progression of chronic kidney disease. -Check cmp in am Nephrology consult, (pt had appt on 9/17 w Dr. Hollie Salk)  Possible COPD,smoker.Suspect COPD exacerbation. Uses albuterol at home. Not usually on oxygen. -Scheduled bronchodilators given poor air movement. When necessary  albuterol  solumetrol => prednisone 60mg  po qday Cont zithromax   Diabetes mellitus type 2 with peripheral neuropathy. Hemoglobin A1c 6.2. -diet-controlled. Continue sliding scale insulin. Continue Neurontin  Microcytic anemia -Check anemia panel. Consider outpatient evaluation.  Coronary artery disease -Troponins flat, trivial elevation. Secondary to demand ischemia. Asymptomatic. No further evaluation suggested. -Continue Lipitor, aspirin  Hypothyroidism. TSH within normal limits. -Continue levothyroxine    Code Status : FULL CODE  Family Communication  : w patient  Disposition Plan  : continue stepdown  Barriers For Discharge :   Consults  :  cardiology  Procedures  : echo 9/13 => EF 65%, severe MR  DVT Prophylaxis  :  Lovenox -SCDs   Lab Results  Component Value Date   PLT 236 03/15/2017    Antibiotics  :  zithromax 9/15=>  Anti-infectives    Start     Dose/Rate Route Frequency Ordered Stop   03/14/17 0900  azithromycin (ZITHROMAX) 500 mg in dextrose 5 % 250 mL IVPB     500 mg 250 mL/hr over 60 Minutes Intravenous Every 24 hours 03/14/17 0802          Objective:   Vitals:   03/15/17 0700 03/15/17 0727 03/15/17 0730 03/15/17 0800  BP: 139/76     Pulse: (!) 59  67 71  Resp: 15  20 (!) 22  Temp:      TempSrc:      SpO2: 97% 96% 97% 98%  Weight:      Height:        Wt Readings from Last 3 Encounters:  03/15/17 68 kg (149 lb 14.6 oz)     Intake/Output Summary (Last 24 hours) at 03/15/17 0815 Last data filed at 03/15/17 0500  Gross per 24 hour  Intake              500 ml  Output  1050 ml  Net             -550 ml     Physical Exam  Awake Alert, Oriented X 3, No new F.N deficits, Normal affect [place].AT,PERRAL Supple Neck, slight  JVD, No cervical lymphadenopathy appriciated.  Symmetrical Chest wall movement, Good air movement bilaterally, decrease in bs at right lung base, slight crackles left lung base rrr s1, s2  2/6 sem apex +ve B.Sounds, Abd Soft, No tenderness, No organomegaly appriciated, No rebound - guarding or rigidity. No Cyanosis, Clubbing or edema, No new Rash or bruise      Data Review:    CBC  Recent Labs Lab 03/12/17 1400 03/15/17 0222  WBC 8.4 6.2  HGB 9.6* 9.4*  HCT 28.0* 26.9*  PLT 275 236  MCV 77.3* 76.6*  MCH 26.5 26.8  MCHC 34.3 34.9  RDW 14.1 13.8    Chemistries   Recent Labs Lab 03/12/17 1400 03/13/17 0202 03/14/17 0313 03/14/17 0320 03/15/17 0222  NA  --  139  --  139 134*  K  --  3.2*  --  3.6 4.3  CL  --  103  --  104 100*  CO2  --  26  --  30 22  GLUCOSE  --  119*  --  124* 165*  BUN  --  29*  --  32* 37*  CREATININE 3.29* 3.15*  --  3.52* 3.53*  CALCIUM  --  8.5*  --  8.7* 8.7*  MG  --   --  1.7  --  1.7  AST  --   --   --   --  18  ALT  --   --   --   --  23  ALKPHOS  --   --   --   --  83  BILITOT  --   --   --   --  0.9   ------------------------------------------------------------------------------------------------------------------  Recent Labs  03/13/17 0202  CHOL 103  HDL 34*  LDLCALC 45  TRIG 121  CHOLHDL 3.0    Lab Results  Component Value Date   HGBA1C 6.2 (H) 03/12/2017   ------------------------------------------------------------------------------------------------------------------  Recent Labs  03/12/17 1400  TSH 1.385   ------------------------------------------------------------------------------------------------------------------  Recent Labs  03/14/17 0320  VITAMINB12 1,193*  FOLATE 11.4  FERRITIN 41  TIBC 228*  IRON 19*    Coagulation profile No results for input(s): INR, PROTIME in the last 168 hours.  No results for input(s): DDIMER in the last 72 hours.  Cardiac Enzymes  Recent Labs Lab 03/14/17 1501 03/14/17 2026 03/15/17 0222  TROPONINI 0.03* 0.03* <0.03   ------------------------------------------------------------------------------------------------------------------      Component Value Date/Time   BNP 2,230.5 (H) 03/12/2017 1400    Inpatient Medications  Scheduled Meds: . amLODipine  5 mg Oral Daily  . aspirin EC  81 mg Oral Daily  . atorvastatin  40 mg Oral Daily  . buPROPion  300 mg Oral Daily  . busPIRone  15 mg Oral Daily  . DULoxetine  30 mg Oral Daily  . enoxaparin (LOVENOX) injection  30 mg Subcutaneous Q24H  . furosemide  40 mg Intravenous TID  . gabapentin  300 mg Oral QODAY  . hydrALAZINE  50 mg Oral TID  . insulin aspart  0-9 Units Subcutaneous TID WC  . ipratropium-albuterol  3 mL Nebulization QID  . labetalol  200 mg Oral BID  . levothyroxine  112 mcg Oral QAC breakfast  . mouth rinse  15 mL Mouth Rinse  BID  . methylPREDNISolone (SOLU-MEDROL) injection  80 mg Intravenous Q8H  . montelukast  10 mg Oral QHS  . nitroGLYCERIN  0.5 inch Topical Q8H  . pantoprazole  40 mg Oral Daily  . pregabalin  150 mg Oral QODAY  . sodium chloride flush  3 mL Intravenous Q12H  . tiZANidine  4 mg Oral TID   Continuous Infusions: . sodium chloride    . azithromycin Stopped (03/14/17 1118)   PRN Meds:.sodium chloride, acetaminophen **OR** acetaminophen, albuterol, ALPRAZolam, famotidine, hydrALAZINE, oxyCODONE, polyethylene glycol, sodium chloride flush, traMADol, zolpidem  Micro Results Recent Results (from the past 240 hour(s))  MRSA PCR Screening     Status: None   Collection Time: 03/12/17  9:52 AM  Result Value Ref Range Status   MRSA by PCR NEGATIVE NEGATIVE Final    Comment:        The GeneXpert MRSA Assay (FDA approved for NASAL specimens only), is one component of a comprehensive MRSA colonization surveillance program. It is not intended to diagnose MRSA infection nor to guide or monitor treatment for MRSA infections.     Radiology Reports Dg Chest Port 1 View  Result Date: 03/13/2017 CLINICAL DATA:  CHF. EXAM: PORTABLE CHEST 1 VIEW COMPARISON:  03/12/2017. FINDINGS: Cardiomegaly with bilateral from interstitial prominence  and bilateral pleural effusions consistent CHF. No change prior exam. No evidence of pneumothorax. No acute bony abnormality. IMPRESSION: Congestive heart failure with pulmonary interstitial edema and bilateral pleural effusions. No change from prior exam. Electronically Signed   By: Marcello Moores  Register   On: 03/13/2017 06:23    Time Spent in minutes  30   Jani Gravel M.D on 03/15/2017 at 8:15 AM  Between 7am to 7pm - Pager - [phone]  After 7pm go to [email or web address] - password [facility]  [facility] -  Office  [phone]

## 2017-03-15 NOTE — Consult Note (Signed)
Renal Service Consult Note [facility]  Jocelyn Hill. Jocelyn Hill 03/15/2017 [place] Requesting Physician:  Dr Maudie Mercury  Reason for Consult:  CKD 3/4 with CHF HPI: The patient is a 66 y.o. year-old with hx of CVA, HTN, HL, low T4, GERD, depression, anxiety, DJD presented on 9/13 with SOB/ DOE to [facility].  Symptoms ongoing > 1 month.  In ED BNP was 1700 and CHF per CXR. Required bipap initially, creat 3.2 on admission w/ reportedly baseline of 2.1.  Pt transferred for admission to [facility] due to renal failure.  Getting nebs/ steroids/ abx for suspected underlying COPD as well.   Since here pt diuresing some with 4.2 L UOP over 3 days. Creat ranging 3.1- 3.5 here. UA is 100 prot, no cells. Patient says she is breathing better.  L arm hurting after IV infiltrated.   No prior admissions here, nor in [place]. Lives in [place].  Divorced, smokes marijuana, says she doesn't smoke cigarettes.     ROS  denies CP  no joint pain   no HA  no blurry vision  no rash  no diarrhea  no nausea/ vomiting   Past Medical History  Past Medical History:  Diagnosis Date  . Anxiety   . Arthritis   . Bronchitis   . Depression   . Diabetes mellitus without complication ([place])   . Diet-controlled diabetes mellitus ([place])   . GERD (gastroesophageal reflux disease)   . Hyperlipidemia   . Hypertension   . Hypothyroidism   . Stroke [facility])    Past Surgical History  Past Surgical History:  Procedure Laterality Date  . ABDOMINAL HYSTERECTOMY     PARTIALS  . TONSILLECTOMY    . TUBAL LIGATION     Family History History reviewed. No pertinent family history. Social History  reports that she has been smoking Cigarettes.  She has never used smokeless tobacco. She reports that she does not drink alcohol or use drugs. Allergies  Allergies  Allergen Reactions  . Ace Inhibitors Anaphylaxis and Swelling   Home medications Prior to Admission medications   Medication Sig Start Date End Date  Taking? Authorizing Provider  albuterol (PROVENTIL HFA;VENTOLIN HFA) 108 (90 Base) MCG/ACT inhaler Inhale 2 puffs into the lungs every 4 (four) hours as needed for wheezing or shortness of breath.   Yes [provider]  amLODipine (NORVASC) 10 MG tablet Take 10 mg by mouth daily.   Yes [provider]  aspirin EC 81 MG tablet Take 81 mg by mouth daily.   Yes [provider]  atorvastatin (LIPITOR) 40 MG tablet Take 40 mg by mouth daily.   Yes [provider]  b complex vitamins capsule Take 1 capsule by mouth daily.   Yes [provider]  buPROPion (WELLBUTRIN XL) 300 MG 24 hr tablet Take 300 mg by mouth daily.   Yes [provider]  cholecalciferol (VITAMIN D) 1000 units tablet Take 1,000 Units by mouth daily.   Yes [provider]  DULoxetine (CYMBALTA) 30 MG capsule Take 30 mg by mouth daily.   Yes [provider]  gabapentin (NEURONTIN) 300 MG capsule Take 300 mg by mouth at bedtime.   Yes [provider]  hydrALAZINE (APRESOLINE) 100 MG tablet Take 100 mg by mouth 3 (three) times daily.   Yes [provider]  labetalol (NORMODYNE) 200 MG tablet Take 200 mg by mouth 2 (two) times daily.   Yes [provider]  levothyroxine (SYNTHROID, LEVOTHROID) 112 MCG tablet Take 112  mcg by mouth daily before breakfast.   Yes [provider]  minoxidil (LONITEN) 2.5 MG tablet Take by mouth daily.   Yes [provider]  montelukast (SINGULAIR) 10 MG tablet Take 10 mg by mouth daily.   Yes [provider]  Oxycodone HCl 10 MG TABS Take 10 mg by mouth 3 (three) times daily as needed (pain).   Yes [provider]  pantoprazole (PROTONIX) 40 MG tablet Take 40 mg by mouth daily.   Yes [provider]  potassium gluconate (HM POTASSIUM) 595 (99 K) MG TABS tablet Take 595 mg by mouth daily.   Yes [provider]  pregabalin (LYRICA) 150 MG capsule Take 150 mg by  mouth daily.   Yes [provider]  tiZANidine (ZANAFLEX) 4 MG capsule Take 4 mg by mouth 3 (three) times daily.   Yes [provider]  zolpidem (AMBIEN) 10 MG tablet Take 10 mg by mouth at bedtime as needed for sleep.   Yes [provider]  busPIRone (BUSPAR) 15 MG tablet Take 15 mg by mouth 2 (two) times daily.    [provider]   Liver Function Tests  Recent Labs Lab 03/15/17 0222  AST 18  ALT 23  ALKPHOS 83  BILITOT 0.9  PROT 5.7*  ALBUMIN 3.0*   No results for input(s): LIPASE, AMYLASE in the last 168 hours. CBC  Recent Labs Lab 03/12/17 1400 03/15/17 0222  WBC 8.4 6.2  HGB 9.6* 9.4*  HCT 28.0* 26.9*  MCV 77.3* 76.6*  PLT 275 034   Basic Metabolic Panel  Recent Labs Lab 03/12/17 1400 03/13/17 0202 03/14/17 0320 03/15/17 0222  NA  --  139 139 134*  K  --  3.2* 3.6 4.3  CL  --  103 104 100*  CO2  --  26 30 22   GLUCOSE  --  119* 124* 165*  BUN  --  29* 32* 37*  CREATININE 3.29* 3.15* 3.52* 3.53*  CALCIUM  --  8.5* 8.7* 8.7*   Iron/TIBC/Ferritin/ %Sat    Component Value Date/Time   IRON 19 (L) 03/14/2017 0320   TIBC 228 (L) 03/14/2017 0320   FERRITIN 41 03/14/2017 0320   IRONPCTSAT 8 (L) 03/14/2017 0320    Vitals:   03/15/17 0700 03/15/17 0727 03/15/17 0730 03/15/17 0800  BP: 139/76   134/84  Pulse: (!) 59  67 71  Resp: 15  20 (!) 22  Temp:    98.3 F (36.8 C)  TempSrc:    Oral  SpO2: 97% 96% 97% 98%  Weight:      Height:       Exam Gen alert, tired No rash, cyanosis or gangrene Sclera anicteric, throat clear  No jvd or bruits, JVP 10-14 Chest bibasilar rales, L base bronchial BS RRR 2/6 sem, no RG Abd soft ntnd no mass or ascites +bs GU defer MS no joint effusions or deformity Ext focal LUE edema, no LE edema, no wounds or ulcers Neuro is alert, Ox 3 , nf   Home meds :   - norvasc/ hydralazine 100 tid/ labetalol 200 bid/ minoxidil 2.5 qd - albuterol/ asa/ lipitor/ T4 / singulair/ PPI / KCl  -  wellbutrin / cymbalta/ neurontin/ Lyrica / ambien prn / oxy prn   CXR 9-14 > +CHF moderate I/O - 1.1 L in and 4.7 L out since admission on 9/13 Creat 3.1- 3.5 range here this admission, no old creat ECHO - LVEF 60%, severe MR, G2DD, mild-mod TR, severe LAE,  PA peak 60 mm   Impression: 1.  CKD - probably stage IV, no old records here though.  Has been seen once this summer at [facility], will get records tomorrow.  Reported that baseline creat 2.1.  2.  CHF/ vol overload - combination fluid retention from CKD4, severe MR. Diuresing, would continue, still wet, ,repeat CXR in am.  3.  HTN - long hx poorly controlled on 4 bp meds at home, overcontrolled now w BP's soft. Will decrease 4.  Hx CVA 5.  DM2 - less than 10 yrs, took pills only, currenlty no meds 6.  Psych/ pain - multiple meds for depression/ pain   Plan - cont diuresing, CXR am, get records Monday, dec BP meds and dc topical NTG, get BP up  Kelly Splinter MD [place] pager [phone]   03/15/2017, 9:14 AM

## 2017-03-15 NOTE — Progress Notes (Signed)
Upon arrival pt's Spo2=88% on HFNC @ 8L. Pt given treatment and placed on BiPAP for the evening.  Pt's SpO2 increased to 91. Pt tolerating well. RT will continue to monitor pt.

## 2017-03-15 NOTE — Progress Notes (Signed)
Progress Note  Patient Name: Jocelyn Hill. Rasmus Date of Encounter: 03/15/2017   Subjective   Mild improvement in her SOB  Inpatient Medications    Scheduled Meds: . amLODipine  5 mg Oral Daily  . aspirin EC  81 mg Oral Daily  . atorvastatin  40 mg Oral Daily  . buPROPion  300 mg Oral Daily  . busPIRone  15 mg Oral Daily  . DULoxetine  30 mg Oral Daily  . enoxaparin (LOVENOX) injection  30 mg Subcutaneous Q24H  . furosemide  40 mg Intravenous TID  . gabapentin  300 mg Oral QODAY  . hydrALAZINE  50 mg Oral TID  . insulin aspart  0-9 Units Subcutaneous TID WC  . ipratropium-albuterol  3 mL Nebulization QID  . labetalol  200 mg Oral BID  . levothyroxine  112 mcg Oral QAC breakfast  . mouth rinse  15 mL Mouth Rinse BID  . methylPREDNISolone (SOLU-MEDROL) injection  80 mg Intravenous Q8H  . montelukast  10 mg Oral QHS  . nitroGLYCERIN  0.5 inch Topical Q8H  . pantoprazole  40 mg Oral Daily  . pregabalin  150 mg Oral QODAY  . sodium chloride flush  3 mL Intravenous Q12H  . tiZANidine  4 mg Oral TID   Continuous Infusions: . sodium chloride    . azithromycin Stopped (03/14/17 1118)   PRN Meds: sodium chloride, acetaminophen **OR** acetaminophen, albuterol, ALPRAZolam, famotidine, hydrALAZINE, oxyCODONE, polyethylene glycol, sodium chloride flush, traMADol, zolpidem   Vital Signs    Vitals:   03/15/17 0500 03/15/17 0600 03/15/17 0700 03/15/17 0727  BP: 129/68 118/66 139/76   Pulse: 61 64 (!) 59   Resp: 14 15 15    Temp:      TempSrc:      SpO2: 96% 95% 97% 96%  Weight: 149 lb 14.6 oz (68 kg)     Height:        Intake/Output Summary (Last 24 hours) at 03/15/17 0742 Last data filed at 03/15/17 0500  Gross per 24 hour  Intake              500 ml  Output             1050 ml  Net             -550 ml   Filed Weights   03/13/17 0400 03/14/17 0425 03/15/17 0500  Weight: 143 lb 1.3 oz (64.9 kg) 146 lb 2.6 oz (66.3 kg) 149 lb 14.6 oz (68 kg)    Telemetry    SR with  PACs and PVCs - Personally Reviewed  ECG     Physical Exam   GEN: No acute distress.   Neck: elevated jvd Cardiac: RRR, 3/6 systolic murmur apex, rubs, or gallops.  Respiratory: decreased breath sounds bilateral bases. GI: Soft, nontender, non-distended  MS: No edema; No deformity. Neuro:  Nonfocal  Psych: Normal affect   Labs    Chemistry Recent Labs Lab 03/13/17 0202 03/14/17 0320 03/15/17 0222  NA 139 139 134*  K 3.2* 3.6 4.3  CL 103 104 100*  CO2 26 30 22   GLUCOSE 119* 124* 165*  BUN 29* 32* 37*  CREATININE 3.15* 3.52* 3.53*  CALCIUM 8.5* 8.7* 8.7*  PROT  --   --  5.7*  ALBUMIN  --   --  3.0*  AST  --   --  18  ALT  --   --  23  ALKPHOS  --   --  83  BILITOT  --   --  0.9  GFRNONAA 14* 13* 13*  GFRAA 17* 15* 15*  ANIONGAP 10 5 12      Hematology Recent Labs Lab 03/12/17 1400 03/15/17 0222  WBC 8.4 6.2  RBC 3.62* 3.51*  HGB 9.6* 9.4*  HCT 28.0* 26.9*  MCV 77.3* 76.6*  MCH 26.5 26.8  MCHC 34.3 34.9  RDW 14.1 13.8  PLT 275 236    Cardiac Enzymes Recent Labs Lab 03/13/17 0202 03/14/17 1501 03/14/17 2026 03/15/17 0222  TROPONINI 0.04* 0.03* 0.03* <0.03   No results for input(s): TROPIPOC in the last 168 hours.   BNP Recent Labs Lab 03/12/17 1400  BNP 2,230.5*     DDimer No results for input(s): DDIMER in the last 168 hours.   Radiology    No results found.  Cardiac Studies     Patient Profile     Jocelyn Hill is a 66 y.o.female history of HTN, CKD 3, DM2, hypothryoidism admitted with SOB. She reports gradual onset of symptoms over the last several weeks. Denies any significant chest pain. Seen initially in [place] [facility], started on bipap. Transferred to [place]    1. Acute diastolic HF - - echo LVEF 60-65%, grade II diastolic dysfunction - complicated by severe MR and renal dysfunction - I/Os incomplete yesterday. Reported negative 3.6 liters since admission.  Overall ptrend in Cr fairly stable  from yesterday, she is on lasix 40mg  IV tid.  - remains volume overloaded by exam, we will continue IV diureisis.   2. Severe mitral regurgitation - described as severe by echo. LVEF 60-65%, LVIDs 44, PASP 60.  - exact leaflet pathology is unclear from review of TTE. She will require TEE once more euvolemic and respiratory status improved - Based on presentation appears symptomatic, based on LVIDs evidence of LV dilatation, may need consideration for MV intervention. Renal function will be an issue if needs cath as part of workup  3. HTN - notes indicate history of poorly controlled HTN, patient noncompliant with home meds - she had evidence of end organ damage with diastolic HF, renal dysfunction  - better controlled today. Actually had some low bp's yesterday, hydralazine lowered to 50mg  tid  4. CKD III-IV - H&P reports baselien Cr of 2.1, unclear where this result was found, do not see in epic. Unclear to me how much is chronic vs acute.  - consider renal consult if don't have significant improvement with diuresis  5. PVCs/PACs -keep K at 4, Mg at 2. She is on beta blocker. No sustained arrhythmias.    For questions or updates, please contact [place] Please consult [email or web address] for contact info under Cardiology/STEMI.      Merrily Pew, MD  03/15/2017, 7:42 AM

## 2017-03-16 ENCOUNTER — Inpatient Hospital Stay (HOSPITAL_COMMUNITY): Payer: Medicare Other

## 2017-03-16 ENCOUNTER — Other Ambulatory Visit: Payer: Self-pay

## 2017-03-16 ENCOUNTER — Inpatient Hospital Stay (HOSPITAL_COMMUNITY): Payer: Medicare Other | Admitting: Certified Registered"

## 2017-03-16 ENCOUNTER — Encounter (HOSPITAL_COMMUNITY): Payer: Self-pay | Admitting: Certified Registered"

## 2017-03-16 ENCOUNTER — Encounter (HOSPITAL_COMMUNITY)
Admission: EM | Disposition: A | Discharge status: Skilled Nursing Facility | Payer: Self-pay | Source: Other Acute Inpatient Hospital | Attending: Internal Medicine

## 2017-03-16 DIAGNOSIS — N186 End stage renal disease: Secondary | ICD-10-CM

## 2017-03-16 DIAGNOSIS — I5031 Acute diastolic (congestive) heart failure: Secondary | ICD-10-CM

## 2017-03-16 DIAGNOSIS — D509 Iron deficiency anemia, unspecified: Secondary | ICD-10-CM

## 2017-03-16 HISTORY — DX: End stage renal disease: N18.6

## 2017-03-16 LAB — POCT I-STAT 3, ART BLOOD GAS (G3+)
Acid-base deficit: 4 mmol/L — ABNORMAL HIGH (ref 0.0–2.0)
Acid-base deficit: 3 mmol/L — ABNORMAL HIGH (ref 0.0–2.0)
Bicarbonate: 20.9 mmol/L (ref 20.0–28.0)
Bicarbonate: 21.2 mmol/L (ref 20.0–28.0)
O2 Saturation: 94 %
O2 Saturation: 98 %
Patient temperature: 35.4
Patient temperature: 98.4
TCO2: 22 mmol/L (ref 22–32)
TCO2: 22 mmol/L (ref 22–32)
pCO2 arterial: 34.4 mmHg (ref 32.0–48.0)
pCO2 arterial: 34.3 mmHg (ref 32.0–48.0)
pH, Arterial: 7.386 (ref 7.350–7.450)
pH, Arterial: 7.398 (ref 7.350–7.450)
pO2, Arterial: 67 mmHg — ABNORMAL LOW (ref 83.0–108.0)
pO2, Arterial: 109 mmHg — ABNORMAL HIGH (ref 83.0–108.0)

## 2017-03-16 LAB — COMPREHENSIVE METABOLIC PANEL
ALT: 440 U/L — ABNORMAL HIGH (ref 14–54)
AST: 395 U/L — ABNORMAL HIGH (ref 15–41)
Albumin: 2.9 g/dL — ABNORMAL LOW (ref 3.5–5.0)
Alkaline Phosphatase: 81 U/L (ref 38–126)
Anion gap: 13 (ref 5–15)
BUN: 63 mg/dL — ABNORMAL HIGH (ref 6–20)
CO2: 18 mmol/L — ABNORMAL LOW (ref 22–32)
Calcium: 7.9 mg/dL — ABNORMAL LOW (ref 8.9–10.3)
Chloride: 98 mmol/L — ABNORMAL LOW (ref 101–111)
Creatinine, Ser: 5.12 mg/dL — ABNORMAL HIGH (ref 0.44–1.00)
GFR calc Af Amer: 9 mL/min — ABNORMAL LOW (ref >60)
GFR calc non Af Amer: 8 mL/min — ABNORMAL LOW (ref >60)
Glucose, Bld: 228 mg/dL — ABNORMAL HIGH (ref 65–99)
Potassium: 5.4 mmol/L — ABNORMAL HIGH (ref 3.5–5.1)
Sodium: 129 mmol/L — ABNORMAL LOW (ref 135–145)
Total Bilirubin: 1.4 mg/dL — ABNORMAL HIGH (ref 0.3–1.2)
Total Protein: 5.4 g/dL — ABNORMAL LOW (ref 6.5–8.1)

## 2017-03-16 LAB — RENAL FUNCTION PANEL
Albumin: 3 g/dL — ABNORMAL LOW (ref 3.5–5.0)
Anion gap: 14 (ref 5–15)
BUN: 60 mg/dL — ABNORMAL HIGH (ref 6–20)
CO2: 19 mmol/L — ABNORMAL LOW (ref 22–32)
Calcium: 8.4 mg/dL — ABNORMAL LOW (ref 8.9–10.3)
Chloride: 96 mmol/L — ABNORMAL LOW (ref 101–111)
Creatinine, Ser: 4.83 mg/dL — ABNORMAL HIGH (ref 0.44–1.00)
GFR calc Af Amer: 10 mL/min — ABNORMAL LOW (ref >60)
GFR calc non Af Amer: 9 mL/min — ABNORMAL LOW (ref >60)
Glucose, Bld: 185 mg/dL — ABNORMAL HIGH (ref 65–99)
Phosphorus: 6.9 mg/dL — ABNORMAL HIGH (ref 2.5–4.6)
Potassium: 5.1 mmol/L (ref 3.5–5.1)
Sodium: 129 mmol/L — ABNORMAL LOW (ref 135–145)

## 2017-03-16 LAB — MAGNESIUM: Magnesium: 1.9 mg/dL (ref 1.7–2.4)

## 2017-03-16 LAB — CBC
HCT: 30.3 % — ABNORMAL LOW (ref 36.0–46.0)
HCT: 30 % — ABNORMAL LOW (ref 36.0–46.0)
Hemoglobin: 10.7 g/dL — ABNORMAL LOW (ref 12.0–15.0)
Hemoglobin: 10.2 g/dL — ABNORMAL LOW (ref 12.0–15.0)
MCH: 26.8 pg (ref 26.0–34.0)
MCH: 25.7 pg — ABNORMAL LOW (ref 26.0–34.0)
MCHC: 35.3 g/dL (ref 30.0–36.0)
MCHC: 34 g/dL (ref 30.0–36.0)
MCV: 75.8 fL — ABNORMAL LOW (ref 78.0–100.0)
MCV: 75.6 fL — ABNORMAL LOW (ref 78.0–100.0)
Platelets: 270 10*3/uL (ref 150–400)
Platelets: 258 10*3/uL (ref 150–400)
RBC: 4 MIL/uL (ref 3.87–5.11)
RBC: 3.97 MIL/uL (ref 3.87–5.11)
RDW: 14 % (ref 11.5–15.5)
RDW: 13.5 % (ref 11.5–15.5)
WBC: 11.5 10*3/uL — ABNORMAL HIGH (ref 4.0–10.5)
WBC: 12.6 10*3/uL — ABNORMAL HIGH (ref 4.0–10.5)

## 2017-03-16 LAB — BLOOD GAS, ARTERIAL
Acid-base deficit: 7.6 mmol/L — ABNORMAL HIGH (ref 0.0–2.0)
Bicarbonate: 17 mmol/L — ABNORMAL LOW (ref 20.0–28.0)
Delivery systems: POSITIVE
Drawn by: 244851
Expiratory PAP: 8
FIO2: 70
Inspiratory PAP: 13
Mode: POSITIVE
O2 Saturation: 75.9 %
Patient temperature: 98.6
pCO2 arterial: 32.1 mmHg (ref 32.0–48.0)
pH, Arterial: 7.344 — ABNORMAL LOW (ref 7.350–7.450)
pO2, Arterial: 48.4 mmHg — ABNORMAL LOW (ref 83.0–108.0)

## 2017-03-16 LAB — GLUCOSE, CAPILLARY
Glucose-Capillary: 181 mg/dL — ABNORMAL HIGH (ref 65–99)
Glucose-Capillary: 255 mg/dL — ABNORMAL HIGH (ref 65–99)
Glucose-Capillary: 243 mg/dL — ABNORMAL HIGH (ref 65–99)
Glucose-Capillary: 259 mg/dL — ABNORMAL HIGH (ref 65–99)

## 2017-03-16 LAB — TROPONIN I: Troponin I: 0.11 ng/mL (ref <0.03)

## 2017-03-16 LAB — PROCALCITONIN: Procalcitonin: 0.26 ng/mL

## 2017-03-16 LAB — PHOSPHORUS: Phosphorus: 7.4 mg/dL — ABNORMAL HIGH (ref 2.5–4.6)

## 2017-03-16 SURGERY — TEMPORARY PACEMAKER
Anesthesia: LOCAL

## 2017-03-16 MED ORDER — SUCCINYLCHOLINE CHLORIDE 20 MG/ML IJ SOLN
INTRAMUSCULAR | Status: DC | PRN
  Administered 2017-03-16: 160 mg via INTRAVENOUS

## 2017-03-16 MED ORDER — MAGNESIUM SULFATE 2 GM/50ML IV SOLN
INTRAVENOUS | Status: AC
  Administered 2017-03-16: 2 g via INTRAVENOUS
  Filled 2017-03-16: qty 50

## 2017-03-16 MED ORDER — MAGNESIUM SULFATE 2 GM/50ML IV SOLN
2.0000 g | Freq: Once | INTRAVENOUS | Status: AC
  Administered 2017-03-16: 2 g via INTRAVENOUS

## 2017-03-16 MED ORDER — INSULIN ASPART 100 UNIT/ML ~~LOC~~ SOLN: 0.0000 [IU] | SUBCUTANEOUS | Status: DC

## 2017-03-16 MED ORDER — SODIUM CHLORIDE 0.9 % IV BOLUS (SEPSIS)
500.0000 mL | Freq: Once | INTRAVENOUS | Status: AC
  Administered 2017-03-16: 500 mL via INTRAVENOUS

## 2017-03-16 MED ORDER — DOCUSATE SODIUM 100 MG PO CAPS
100.0000 mg | ORAL_CAPSULE | Freq: Two times a day (BID) | ORAL | Status: DC
  Administered 2017-03-16: 100 mg via ORAL
  Filled 2017-03-16: qty 1

## 2017-03-16 MED ORDER — ASPIRIN 81 MG PO CHEW
81.0000 mg | CHEWABLE_TABLET | Freq: Every day | ORAL | Status: DC
  Administered 2017-03-17 – 2017-03-26 (×10): 81 mg
  Filled 2017-03-16 (×11): qty 1

## 2017-03-16 MED ORDER — CHLORHEXIDINE GLUCONATE 0.12% ORAL RINSE (MEDLINE KIT)
15.0000 mL | Freq: Two times a day (BID) | OROMUCOSAL | Status: DC
  Administered 2017-03-16 – 2017-03-17 (×2): 15 mL via OROMUCOSAL

## 2017-03-16 MED ORDER — DOPAMINE-DEXTROSE 3.2-5 MG/ML-% IV SOLN
10.0000 ug/kg/min | INTRAVENOUS | Status: DC
  Administered 2017-03-16: 14:00:00 via INTRAVENOUS

## 2017-03-16 MED ORDER — LEVOTHYROXINE SODIUM 112 MCG PO TABS
112.0000 ug | ORAL_TABLET | Freq: Every day | ORAL | Status: DC
  Administered 2017-03-17 – 2017-03-26 (×10): 112 ug
  Filled 2017-03-16 (×11): qty 1

## 2017-03-16 MED ORDER — DOPAMINE-DEXTROSE 3.2-5 MG/ML-% IV SOLN
INTRAVENOUS | Status: AC
  Filled 2017-03-16: qty 250

## 2017-03-16 MED ORDER — NOREPINEPHRINE BITARTRATE 1 MG/ML IV SOLN
0.0000 ug/min | INTRAVENOUS | Status: DC
  Administered 2017-03-16: 10 ug/min via INTRAVENOUS
  Administered 2017-03-17: 4 ug/min via INTRAVENOUS
  Filled 2017-03-16 (×2): qty 4

## 2017-03-16 MED ORDER — ATROPINE SULFATE 1 MG/10ML IJ SOSY
PREFILLED_SYRINGE | INTRAMUSCULAR | Status: AC
  Filled 2017-03-16: qty 10

## 2017-03-16 MED ORDER — HEPARIN SODIUM (PORCINE) 5000 UNIT/ML IJ SOLN
5000.0000 [IU] | Freq: Three times a day (TID) | INTRAMUSCULAR | Status: DC
  Administered 2017-03-16 – 2017-03-23 (×22): 5000 [IU] via SUBCUTANEOUS
  Filled 2017-03-16 (×22): qty 1

## 2017-03-16 MED ORDER — IPRATROPIUM-ALBUTEROL 0.5-2.5 (3) MG/3ML IN SOLN
3.0000 mL | Freq: Three times a day (TID) | RESPIRATORY_TRACT | Status: DC
  Administered 2017-03-17 – 2017-03-19 (×8): 3 mL via RESPIRATORY_TRACT
  Filled 2017-03-16 (×8): qty 3

## 2017-03-16 MED ORDER — PANTOPRAZOLE SODIUM 40 MG IV SOLR
40.0000 mg | Freq: Every day | INTRAVENOUS | Status: DC
  Administered 2017-03-17 – 2017-03-22 (×6): 40 mg via INTRAVENOUS
  Filled 2017-03-16 (×6): qty 40

## 2017-03-16 MED ORDER — INSULIN GLARGINE 100 UNIT/ML ~~LOC~~ SOLN
12.0000 [IU] | Freq: Every day | SUBCUTANEOUS | Status: DC
  Administered 2017-03-16: 12 [IU] via SUBCUTANEOUS
  Filled 2017-03-16: qty 0.12

## 2017-03-16 MED ORDER — AZITHROMYCIN 250 MG PO TABS
250.0000 mg | ORAL_TABLET | Freq: Every day | ORAL | Status: DC
  Administered 2017-03-16: 250 mg via ORAL
  Filled 2017-03-16: qty 1

## 2017-03-16 MED ORDER — SODIUM CHLORIDE 0.9 % IV BOLUS (SEPSIS)
1000.0000 mL | Freq: Once | INTRAVENOUS | Status: AC
  Administered 2017-03-16: 1000 mL via INTRAVENOUS

## 2017-03-16 MED ORDER — IPRATROPIUM-ALBUTEROL 0.5-2.5 (3) MG/3ML IN SOLN
3.0000 mL | Freq: Four times a day (QID) | RESPIRATORY_TRACT | Status: DC
  Administered 2017-03-16: 3 mL via RESPIRATORY_TRACT
  Filled 2017-03-16 (×2): qty 3

## 2017-03-16 MED ORDER — ATORVASTATIN CALCIUM 40 MG PO TABS
40.0000 mg | ORAL_TABLET | Freq: Every day | ORAL | Status: DC
  Administered 2017-03-17: 40 mg
  Filled 2017-03-16: qty 1

## 2017-03-16 MED ORDER — POLYETHYLENE GLYCOL 3350 17 G PO PACK
17.0000 g | PACK | Freq: Every day | ORAL | Status: DC
  Administered 2017-03-16: 17 g via ORAL
  Filled 2017-03-16: qty 1

## 2017-03-16 MED ORDER — IPRATROPIUM-ALBUTEROL 0.5-2.5 (3) MG/3ML IN SOLN: 3.0000 mL | Freq: Three times a day (TID) | RESPIRATORY_TRACT | Status: DC

## 2017-03-16 MED ORDER — FENTANYL CITRATE (PF) 100 MCG/2ML IJ SOLN
50.0000 ug | INTRAMUSCULAR | Status: DC | PRN
  Administered 2017-03-16 – 2017-03-17 (×5): 50 ug via INTRAVENOUS
  Filled 2017-03-16 (×5): qty 2

## 2017-03-16 MED ORDER — SIMETHICONE 40 MG/0.6ML PO SUSP
40.0000 mg | Freq: Once | ORAL | Status: AC
  Administered 2017-03-16: 40 mg via ORAL
  Filled 2017-03-16 (×2): qty 0.6

## 2017-03-16 MED ORDER — PREDNISONE 20 MG PO TABS
40.0000 mg | ORAL_TABLET | Freq: Every day | ORAL | Status: DC
  Administered 2017-03-16: 40 mg via ORAL
  Filled 2017-03-16: qty 2

## 2017-03-16 MED ORDER — DOCUSATE SODIUM 50 MG/5ML PO LIQD: 100.0000 mg | Freq: Two times a day (BID) | ORAL | Status: DC | PRN

## 2017-03-16 MED ORDER — BISACODYL 10 MG RE SUPP
10.0000 mg | Freq: Every day | RECTAL | Status: DC | PRN
Start: 2017-03-16 — End: 2017-04-03
  Administered 2017-03-22: 10 mg via RECTAL
  Filled 2017-03-16: qty 1

## 2017-03-16 MED ORDER — FERROUS SULFATE 325 (65 FE) MG PO TABS
325.0000 mg | ORAL_TABLET | Freq: Every day | ORAL | Status: DC
  Administered 2017-03-16: 325 mg via ORAL
  Filled 2017-03-16: qty 1

## 2017-03-16 MED ORDER — MIDAZOLAM HCL 2 MG/2ML IJ SOLN
1.0000 mg | INTRAMUSCULAR | Status: DC | PRN
  Administered 2017-03-16 – 2017-03-17 (×2): 1 mg via INTRAVENOUS
  Filled 2017-03-16 (×2): qty 2

## 2017-03-16 MED ORDER — ORAL CARE MOUTH RINSE
15.0000 mL | Freq: Four times a day (QID) | OROMUCOSAL | Status: DC
  Administered 2017-03-16 – 2017-03-17 (×3): 15 mL via OROMUCOSAL

## 2017-03-16 MED ORDER — INSULIN ASPART 100 UNIT/ML ~~LOC~~ SOLN
0.0000 [IU] | SUBCUTANEOUS | Status: DC
  Administered 2017-03-16: 5 [IU] via SUBCUTANEOUS
  Administered 2017-03-16: 8 [IU] via SUBCUTANEOUS
  Administered 2017-03-17: 2 [IU] via SUBCUTANEOUS
  Administered 2017-03-17 – 2017-03-18 (×2): 3 [IU] via SUBCUTANEOUS
  Administered 2017-03-18 – 2017-03-19 (×3): 2 [IU] via SUBCUTANEOUS
  Administered 2017-03-19: 3 [IU] via SUBCUTANEOUS
  Administered 2017-03-19 – 2017-03-20 (×4): 2 [IU] via SUBCUTANEOUS

## 2017-03-16 MED ORDER — ETOMIDATE 2 MG/ML IV SOLN
INTRAVENOUS | Status: DC | PRN
  Administered 2017-03-16: 8 mg via INTRAVENOUS

## 2017-03-16 NOTE — Progress Notes (Signed)
Progress Note  Patient Name: Jocelyn Hill Date of Encounter: 03/16/2017  Primary Cardiologist: [facility] team   Subjective   66 year old female with a history of hypertension,  chronic kidney disease  stage III, diabetes mellitus type 2 who was transferred from [facility] to [facility] with shortness of breath.   She was found have acute diastolic congestive heart failure. Left ventricular systolic function was normal with EF of 60-65%. She has grade 2 diastolic dysfunction. She has severe mitral regurgitation with estimated pulmonary artery pressures of 60.   Her I/Os  are -3.5 L so far this admission.   Inpatient Medications    Scheduled Meds: . amLODipine  2.5 mg Oral BID  . aspirin EC  81 mg Oral Daily  . atorvastatin  40 mg Oral Daily  . azithromycin  250 mg Oral Daily  . buPROPion  300 mg Oral Daily  . busPIRone  15 mg Oral Daily  . docusate sodium  100 mg Oral BID  . DULoxetine  30 mg Oral Daily  . enoxaparin (LOVENOX) injection  30 mg Subcutaneous Q24H  . ferrous sulfate  325 mg Oral Q breakfast  . furosemide  40 mg Intravenous TID  . gabapentin  300 mg Oral QODAY  . hydrALAZINE  25 mg Oral TID  . insulin aspart  0-9 Units Subcutaneous TID WC  . insulin glargine  12 Units Subcutaneous Daily  . ipratropium-albuterol  3 mL Nebulization TID  . labetalol  200 mg Oral BID  . levothyroxine  112 mcg Oral QAC breakfast  . mouth rinse  15 mL Mouth Rinse BID  . montelukast  10 mg Oral QHS  . pantoprazole  40 mg Oral Daily  . polyethylene glycol  17 g Oral Daily  . predniSONE  40 mg Oral Q breakfast  . pregabalin  150 mg Oral QODAY  . simethicone  40 mg Oral Once  . sodium chloride flush  3 mL Intravenous Q12H  . tiZANidine  4 mg Oral TID   Continuous Infusions: . sodium chloride     PRN Meds: sodium chloride, acetaminophen **OR** acetaminophen, albuterol, ALPRAZolam, famotidine, hydrALAZINE, oxyCODONE, sodium chloride flush, traMADol, zolpidem    Vital Signs    Vitals:   03/16/17 0100 03/16/17 0200 03/16/17 0300 03/16/17 0331  BP: 114/80 120/81 100/68   Pulse: 66 66 65   Resp: 18 17 18    Temp:   97.9 F (36.6 C)   TempSrc:      SpO2: 94% 92% 94%   Weight:    147 lb 11.3 oz (67 kg)  Height:        Intake/Output Summary (Last 24 hours) at 03/16/17 0901 Last data filed at 03/15/17 2200  Gross per 24 hour  Intake              490 ml  Output              395 ml  Net               95 ml   Filed Weights   03/15/17 0500 03/15/17 2027 03/16/17 0331  Weight: 149 lb 14.6 oz (68 kg) 147 lb 14.4 oz (67.1 kg) 147 lb 11.3 oz (67 kg)    Telemetry    NSR  - Personally Reviewed  ECG    NSR  - Personally Reviewed  Physical Exam   GEN: No acute distress.  Sleepy , breathing is ok  Neck: No JVD Cardiac: RRR, she is  2 to 3/6 systolic murmur at the left sternal border radiating to the axilla.  Respiratory:  rales in the bases ,  Decreased breath sounds bilaterally  GI: Soft, nontender, non-distended  MS: No edema; No deformity. Neuro:  Nonfocal  Psych: Normal affect   Labs    Chemistry Recent Labs Lab 03/14/17 0320 03/15/17 0222 03/16/17 0758  NA 139 134* 129*  K 3.6 4.3 5.1  CL 104 100* 96*  CO2 30 22 19*  GLUCOSE 124* 165* 185*  BUN 32* 37* 60*  CREATININE 3.52* 3.53* 4.83*  CALCIUM 8.7* 8.7* 8.4*  PROT  --  5.7*  --   ALBUMIN  --  3.0* 3.0*  AST  --  18  --   ALT  --  23  --   ALKPHOS  --  83  --   BILITOT  --  0.9  --   GFRNONAA 13* 13* 9*  GFRAA 15* 15* 10*  ANIONGAP 5 12 14      Hematology Recent Labs Lab 03/12/17 1400 03/15/17 0222 03/16/17 0758  WBC 8.4 6.2 11.5*  RBC 3.62* 3.51* 4.00  HGB 9.6* 9.4* 10.7*  HCT 28.0* 26.9* 30.3*  MCV 77.3* 76.6* 75.8*  MCH 26.5 26.8 26.8  MCHC 34.3 34.9 35.3  RDW 14.1 13.8 14.0  PLT 275 236 270    Cardiac Enzymes Recent Labs Lab 03/13/17 0202 03/14/17 1501 03/14/17 2026 03/15/17 0222  TROPONINI 0.04* 0.03* 0.03* <0.03   No results for  input(s): TROPIPOC in the last 168 hours.   BNP Recent Labs Lab 03/12/17 1400  BNP 2,230.5*     DDimer No results for input(s): DDIMER in the last 168 hours.   Radiology    Dg Chest Port 1 View  Result Date: 03/16/2017 CLINICAL DATA:  Increased SOB, CP. Abdominal pain and constipation. No BM in 48 hours. Hx of DM, HTN, stroke. Smoker. EXAM: PORTABLE CHEST 1 VIEW COMPARISON:  03/13/2017 FINDINGS: Stable cardiomegaly. Stable bilateral pleural effusions and bibasilar atelectasis. Stable pulmonary interstitial prominence, consistent with mild interstitial edema. No pneumothorax visualized. IMPRESSION: No significant change in cardiomegaly, bilateral pleural effusions, and probable mild pulmonary interstitial edema Electronically Signed   By: Earle Gell M.D.   On: 03/16/2017 08:16   Dg Abd Portable 1v  Result Date: 03/16/2017 CLINICAL DATA:  Abdominal pain.  Constipation. EXAM: PORTABLE ABDOMEN - 1 VIEW COMPARISON:  None. FINDINGS: The bowel gas pattern is normal with minimal air scattered throughout bowel. No excessive stool in the colon. No fecal impaction. Extensive calcification in the abdominal aorta and iliac arteries. No significant bone abnormality. IMPRESSION: Benign-appearing abdomen.  No excessive stool in the colon. Aortic atherosclerosis. Electronically Signed   By: Lorriane Shire M.D.   On: 03/16/2017 08:17    Cardiac Studies     Patient Profile     66 y.o. female admitted with acute diastolic congestive heart failure. She is found to have severe mitral regurgitation.  Assessment & Plan   1. Mitral regurgitation: Jocelyn Hill has severe mitral regurgitation. She has been diuresed and is breathing much better. Will need a tee.   Will try to schedule in the next several days.  2. Acute on chronic diastolic congestive heart failure: Her ins and outs are -3.5 L so far this admission. Continue current dose of furosemide.  3.   Chronic kidney disease:  Cr is up to  4.8 Nephrology has been consulted.    For questions or updates, please contact [place] Please consult [email or web address] for  contact info under Cardiology/STEMI.      Signed, Mertie Moores, MD  03/16/2017, 9:01 AM

## 2017-03-16 NOTE — Progress Notes (Signed)
    [facility] has been requested to perform a transesophageal echocardiogram on Jocelyn Hill for mitral regurgitation.  After careful review of history and examination, the risks and benefits of transesophageal echocardiogram have been explained including risks of esophageal damage, perforation (1:10,000 risk), bleeding, pharyngeal hematoma as well as other potential complications associated with conscious sedation including aspiration, arrhythmia, respiratory failure and death. Alternatives to treatment were discussed, questions were answered. Patient is willing to proceed.   Pt is scheduled for TEE tomorrow 03/17/17 at noon with Dr. Stanford Breed. NPO at [place].  Tami Lin Naiara Lombardozzi, [place]  03/16/2017 12:01 PM

## 2017-03-16 NOTE — Progress Notes (Signed)
Patient's phone and charger sent with son Jocelyn Hill.

## 2017-03-16 NOTE — Progress Notes (Signed)
RT note: RT attempted several times to place right radial aline. Rt was able to receive blood flow but unable to place catheter. RN and MD aware.

## 2017-03-16 NOTE — Progress Notes (Signed)
03/16/2017 12:48 PM  I was called to see patient by RN as she developed an acute decompensation on the floor, became severely bradycardic and hypotensive.  When I arrived she was receiving a bolus of IVFs, she was hypotensive 80/60, HR 40s, Dr. Lorrin Jackson was present. Pt was minimally responsive.  Code Blue was called and patient was intubated and transferred to ICU.  Please see code documentation.  I spoke with patient's eldest daughter who arrived from [place] and updated her on condition.  [facility] also notified other family members by telephone.  I spoke with anesthesiologist and agreed with starting levophed for low BPs.  Further management per the critical care team who will take over care.  Murvin Natal MD

## 2017-03-16 NOTE — Procedures (Signed)
Central Venous Catheter Insertion Procedure Note Charonda Hefter. Bresee [id number] 07/29/1950  Procedure: Insertion of Central Venous Catheter Indications: Assessment of intravascular volume, Drug and/or fluid administration and Frequent blood sampling  Procedure Details Consent: Risks of procedure as well as the alternatives and risks of each were explained to the (patient/caregiver).  Consent for procedure obtained. Time Out: Verified patient identification, verified procedure, site/side was marked, verified correct patient position, special equipment/implants available, medications/allergies/relevent history reviewed, required imaging and test results available.  Performed  Maximum sterile technique was used including antiseptics, cap, gloves, gown, hand hygiene, mask and sheet. Skin prep: Chlorhexidine; local anesthetic administered A antimicrobial bonded/coated triple lumen catheter was placed in the right internal jugular vein using the Seldinger technique.  Evaluation Blood flow good Complications: No apparent complications Patient did tolerate procedure well. Chest X-ray ordered to verify placement.  CXR: pending.  Procedure performed under direct ultrasound guidance for real time vessel cannulation.      Montey Hora, [facility] Pager: [phone]  or [phone] 03/16/2017, 2:56 PM

## 2017-03-16 NOTE — Care Management Note (Addendum)
Case Management Note  Patient Details  Name: Jocelyn Hill. Hokenson MRN: [id number] Date of Birth: 04-Nov-1950  Subjective/Objective:     Transfer from [facility] ,  Presents with Acute on chronic resp failure with hypoxia,  Severe MR, Chas hx of DM, Acute diastolic CHF,  COPD, AKI, Dysphagia, malignant htn, microcytic anemia, hypothyroidism, Cards consulted for eventual TEE and CT surgery consult.  9/20 [place], BSN -  coded 04/13/2023 and intubated and extubated 9/19. BSE 03/13/17 with suspected esophageal dysphagia , she has severe MR, HTN, CKD,  She will need a cath and dental consult prior to MVR.   NCM spoke with MD , he states the dentist  Is off for a week and needed to know if CM could schedule a dental apt off site.  NCM informed Manager of this information. She is looking into this information, meanwhile there is a Buyer, retail on [place], attending MD will check with him to see if he would be able to do the extractions before surgery.   9/21 [place], BSN - today, patient has brady down and minimally responsive on bipap, with HR of 47 and BP of 75/53, cyanosis around lips, diaphoretic. Rapid response called.                 Action/Plan: NCM will follow for dc needs.   Expected Discharge Date:   (UNKNOWN)               Expected Discharge Plan:  [place]  In-House Referral:     Discharge planning Services  CM Consult  Post Acute Care Choice:  Home Health Choice offered to:  NA  DME Arranged:    DME Agency:     HH Arranged:  Disease Management [place] Agency:  [facility] (now [facility])  Status of Service:  In process, will continue to follow  If discussed at Long Length of Stay Meetings, dates discussed:    Additional Comments:  Zenon Mayo, RN 2017/04/12, 2:45 PM

## 2017-03-16 NOTE — Significant Event (Signed)
Rapid Response Event Note  Overview: Time Called: 1201 Arrival Time: 1210 Event Type: Cardiac, Respiratory  Initial Focused Assessment:  Called to bedside for pt with symptomatic bradycardia.  Pt minimally responsive on bipap with HR of 47 and BP of 75/53.  NS bolus infusing on my arrival.  Dr. Jonnie Finner at bedside.  Lung sounds were clear bilaterally, cyanosis around lips, diaphoretic.    EKG with junctional bradycardia.    See Code sheet for further details.  Interventions: Code blue called by staff, no loss of pulse.  2nd IV started by pt's RN.  Total of 2 mg atropine given.  BVM ventilation initiated.  Pt intubated by CRNA with etomidate and 120mg  succinylcholine at bedside.  500 ml NS bolus given.  Transferred to 8H63. Family notified by [facility].     Event Summary: Name of Physician Notified: Dr. Jonnie Finner at 1201 (at bedside on my arrival)  Name of Consulting Physician Notified: Dr. Titus Mould at    Outcome: Transferred (Comment)  Event End Time: [place]  Raziyah Vanvleck

## 2017-03-16 NOTE — Anesthesia Procedure Notes (Signed)
Procedure Name: Intubation Date/Time: 03/16/2017 12:44 PM Performed by: Teressa Lower Pre-anesthesia Checklist: Patient identified, Emergency Drugs available, Suction available and Patient being monitored Patient Re-evaluated:Patient Re-evaluated prior to induction Oxygen Delivery Method: Circle System Utilized Preoxygenation: Pre-oxygenation with 100% oxygen Induction Type: IV induction Ventilation: Mask ventilation without difficulty Laryngoscope Size: Mac and 3 Grade View: Grade I Tube type: Oral Number of attempts: 1 Airway Equipment and Method: Stylet and Oral airway Placement Confirmation: ETT inserted through vocal cords under direct vision,  positive ETCO2 and breath sounds checked- equal and bilateral Secured at: 21 cm Tube secured with: Tape Dental Injury: Teeth and Oropharynx as per pre-operative assessment

## 2017-03-16 NOTE — Progress Notes (Addendum)
Patient ID: Jocelyn Hill. Mccullar, female   DOB: 16-Oct-1950, 66 y.o.   MRN: [id number]                                                                PROGRESS NOTE                                                                                                                                                                                                             Patient Demographics:    Jocelyn Hill, is a 66 y.o. female, DOB - Oct 19, 1950, HMC:[id number]  Admit date - 03/12/2017   Admitting Physician Annita Brod, MD  Outpatient Primary MD for the patient is Antil, Venia Carbon, MD  LOS - 4  Outpatient Specialists:    Hollie Salk (nephrology)  No chief complaint on file.    dyspnea  Brief Narrative  66 year old woman PMH orally controlled hypertension, chronic kidney disease stage III, MI, diabetes mellitus with peripheral neuropathy, presented with shortness of breath. BNP 1700, chest x-ray showed bilateral pleural effusions. SBP 180-190. Started on BiPAP. Because of suspected heart failure and elevated creatinine, transferred to higher level of care.   Hospital course:  Pt admitted on 9/13 and started on diuresis w lasix 40mg  iv bid.  Pt tx with prednisone for ? Copd exacerbation.  Pt thought to have dyspnea secondary to CHF, /Copd.   Cardiac echo on 9/13 => EF 60-65%, and severe MR.  Cardiology consulted.  Pt required bipap for brief episodes on 9/15, and her lasix was increased to 40mg  iv tid.   Pt briefly hypotensive in 9/15 am.  Her bp medication adjusted.  Creatinine remains at about 3.5   Subjective:    Jocelyn Hill reports that she is having abdominal pain, no bowel movement for last 3 days, breathing better, less edema in legs. No chest pain.   Review of Systems: positive constipation and abdominal pain.   No headache, No abdominal pain - No Nausea, No new weakness tingling or numbness as per HPI otherwise all reviewed and reported negative.      Assessment  & Plan :    Principal  Problem:   Acute on chronic respiratory failure with hypoxia (HCC) Active Problems:   Hypertension   Hypothyroidism   Diet-controlled diabetes mellitus (HCC)   Acute diastolic (  congestive) heart failure (HCC)   AKI (acute kidney injury) ([place])   CKD (chronic kidney disease), stage III   CHF (congestive heart failure) ([place])   Dysphagia   Malignant hypertension   Microcytic anemia  Severe MR Cardiology consulted and following closely Planning for eventual TEE and CT surgery consult   Acute hypoxic respiratory failuresecondary to acute diastolic heart failure in the setting of uncontrolled hypertensionand probable undiagnosed COPD. Restart bipap due to increase in dyspnea.  COPD contributing Continue solumedrol 80mg  iv q8, and zithromax 500mg  iv qday (started 9/15)=> prednisone 9/16 But most of her dyspnea is probably from her CHF and severe MR Port CXR today  Acute diastolic congestive heart failure.Possible contribution would be minoxidil which can cause pulmonary edema if not on diuretic. I do not see that she was on a diuretic as an outpatient. -Continue IV diuresis, daily weights, strict I/O -Hold minoxidil. If resumed on discharge, would add diuretic.  Malignant hypertension Continue amlodipine 2.5 mg daily Continue hydralazine to 25 mg po tid Cont labetalol 200 BID  Hypotension - improved with medication adjustments  Acute kidney injurysuperimposed on chronic kidney disease stage IV -Ordered labs this morning and daily labs for future -Nephrology consulted and following  COPD,long time smoker.Treating COPD exacerbation. Uses albuterol at home. Not usually on oxygen. -Scheduled bronchodilators given poor air movement. When necessary albuterol  solumetrol => start weaning prednisone, reduce to 40 mg 9/17 Azithromycinx 3 more doses then completed course  Diabetes mellitus type 2 with peripheral neuropathy. Hemoglobin A1c 6.2. -diet-controlled prior to  admission. Continue sliding scale insulin. Continue Neurontin.  Add basal insulin while on steroids  CBG (last 3)   Recent Labs  03/15/17 1134 03/15/17 1556 03/15/17 2120  GLUCAP 242* 234* 262*   Microcytic anemia / Iron Deficiency Anemia -add iron supplement  - Protonix ordered for GI protection  Constipation - adding stool softeners (scheduled)  Coronary artery disease -Troponins flat, trivial elevation. Secondary to demand ischemia. Asymptomatic. No further evaluation suggested. -Continue Lipitor, aspirin  Hypothyroidism. TSH within normal limits. -Continue levothyroxine   Code Status : FULL CODE  Family Communication  : w patient  Disposition Plan  : TBD  Barriers For Discharge :   Consults  :  cardiology  Procedures  : echo 9/13 => EF 65%, severe MR  DVT Prophylaxis  :  Lovenox -SCDs   Lab Results  Component Value Date   PLT 236 03/15/2017    Antibiotics  :  zithromax 9/15=>  Anti-infectives    Start     Dose/Rate Route Frequency Ordered Stop   03/14/17 0900  azithromycin (ZITHROMAX) 500 mg in dextrose 5 % 250 mL IVPB     500 mg 250 mL/hr over 60 Minutes Intravenous Every 24 hours 03/14/17 0802          Objective:   Vitals:   03/16/17 0100 03/16/17 0200 03/16/17 0300 03/16/17 0331  BP: 114/80 120/81 100/68   Pulse: 66 66 65   Resp: 18 17 18    Temp:   97.9 F (36.6 C)   TempSrc:      SpO2: 94% 92% 94%   Weight:    67 kg (147 lb 11.3 oz)  Height:        Wt Readings from Last 3 Encounters:  03/16/17 67 kg (147 lb 11.3 oz)     Intake/Output Summary (Last 24 hours) at 03/16/17 0725 Last data filed at 03/15/17 2200  Gross per 24 hour  Intake  490 ml  Output              395 ml  Net               95 ml   Physical Exam  General: Awake Alert, Oriented X 3, on HFNC Normal affect HEENT: [place]/AT,PERRLA, dry MM. Neck: Supple  +JVD, No cervical lymphadenopathy. .  CHEST: BBS decrease in bs at right lung base, slight  crackles left lung bases CV:  Normal s1, s2  With 2/6 sem  ABD: normal BS, Abd Soft, No tenderness, No organomegaly appriciated, No rebound - guarding or rigidity. EXT: No Cyanosis, Clubbing or edema, No new Rash or bruise   NEURO: nonfocal.    Data Review:    CBC  Recent Labs Lab 03/12/17 1400 03/15/17 0222  WBC 8.4 6.2  HGB 9.6* 9.4*  HCT 28.0* 26.9*  PLT 275 236  MCV 77.3* 76.6*  MCH 26.5 26.8  MCHC 34.3 34.9  RDW 14.1 13.8    Chemistries   Recent Labs Lab 03/12/17 1400 03/13/17 0202 03/14/17 0313 03/14/17 0320 03/15/17 0222  NA  --  139  --  139 134*  K  --  3.2*  --  3.6 4.3  CL  --  103  --  104 100*  CO2  --  26  --  30 22  GLUCOSE  --  119*  --  124* 165*  BUN  --  29*  --  32* 37*  CREATININE 3.29* 3.15*  --  3.52* 3.53*  CALCIUM  --  8.5*  --  8.7* 8.7*  MG  --   --  1.7  --  1.7  AST  --   --   --   --  18  ALT  --   --   --   --  23  ALKPHOS  --   --   --   --  83  BILITOT  --   --   --   --  0.9   ------------------------------------------------------------------------------------------------------------------ No results for input(s): CHOL, HDL, LDLCALC, TRIG, CHOLHDL, LDLDIRECT in the last 72 hours.  Lab Results  Component Value Date   HGBA1C 6.2 (H) 03/12/2017   ------------------------------------------------------------------------------------------------------------------ No results for input(s): TSH, T4TOTAL, T3FREE, THYROIDAB in the last 72 hours.  Invalid input(s): FREET3 ------------------------------------------------------------------------------------------------------------------  Recent Labs  03/14/17 0320  VITAMINB12 1,193*  FOLATE 11.4  FERRITIN 41  TIBC 228*  IRON 19*    Coagulation profile No results for input(s): INR, PROTIME in the last 168 hours.  No results for input(s): DDIMER in the last 72 hours.  Cardiac Enzymes  Recent Labs Lab 03/14/17 1501 03/14/17 2026 03/15/17 0222  TROPONINI 0.03* 0.03*  <0.03   ------------------------------------------------------------------------------------------------------------------    Component Value Date/Time   BNP 2,230.5 (H) 03/12/2017 1400    Inpatient Medications  Scheduled Meds: . amLODipine  2.5 mg Oral BID  . aspirin EC  81 mg Oral Daily  . atorvastatin  40 mg Oral Daily  . buPROPion  300 mg Oral Daily  . busPIRone  15 mg Oral Daily  . DULoxetine  30 mg Oral Daily  . enoxaparin (LOVENOX) injection  30 mg Subcutaneous Q24H  . furosemide  40 mg Intravenous TID  . gabapentin  300 mg Oral QODAY  . hydrALAZINE  25 mg Oral TID  . insulin aspart  0-9 Units Subcutaneous TID WC  . ipratropium-albuterol  3 mL Nebulization QID  . labetalol  200 mg Oral BID  .  levothyroxine  112 mcg Oral QAC breakfast  . mouth rinse  15 mL Mouth Rinse BID  . montelukast  10 mg Oral QHS  . pantoprazole  40 mg Oral Daily  . predniSONE  60 mg Oral Q breakfast  . pregabalin  150 mg Oral QODAY  . sodium chloride flush  3 mL Intravenous Q12H  . tiZANidine  4 mg Oral TID   Continuous Infusions: . sodium chloride    . azithromycin Stopped (03/15/17 1017)   PRN Meds:.sodium chloride, acetaminophen **OR** acetaminophen, albuterol, ALPRAZolam, famotidine, hydrALAZINE, oxyCODONE, polyethylene glycol, sodium chloride flush, traMADol, zolpidem  Micro Results Recent Results (from the past 240 hour(s))  MRSA PCR Screening     Status: None   Collection Time: 03/12/17  9:52 AM  Result Value Ref Range Status   MRSA by PCR NEGATIVE NEGATIVE Final    Comment:        The GeneXpert MRSA Assay (FDA approved for NASAL specimens only), is one component of a comprehensive MRSA colonization surveillance program. It is not intended to diagnose MRSA infection nor to guide or monitor treatment for MRSA infections.   MRSA PCR Screening     Status: None   Collection Time: 03/15/17  8:32 PM  Result Value Ref Range Status   MRSA by PCR NEGATIVE NEGATIVE Final     Comment:        The GeneXpert MRSA Assay (FDA approved for NASAL specimens only), is one component of a comprehensive MRSA colonization surveillance program. It is not intended to diagnose MRSA infection nor to guide or monitor treatment for MRSA infections.     Radiology Reports Dg Chest Port 1 View  Result Date: 03/13/2017 CLINICAL DATA:  CHF. EXAM: PORTABLE CHEST 1 VIEW COMPARISON:  03/12/2017. FINDINGS: Cardiomegaly with bilateral from interstitial prominence and bilateral pleural effusions consistent CHF. No change prior exam. No evidence of pneumothorax. No acute bony abnormality. IMPRESSION: Congestive heart failure with pulmonary interstitial edema and bilateral pleural effusions. No change from prior exam. Electronically Signed   By: Marcello Moores  Register   On: 03/13/2017 06:23    Time Spent in minutes  27   Irwin Brakeman M.D on 03/16/2017 at 7:25 AM  Between 7am to 7pm - Pager - [phone]  After 7pm go to [email or web address] - password [facility]  [facility] -  Office  [phone]

## 2017-03-16 NOTE — Procedures (Signed)
BiPAP removed by RN and placed on HFNC@10L . HR-64, RR22, SpO2-97

## 2017-03-16 NOTE — Consult Note (Signed)
PULMONARY / CRITICAL CARE MEDICINE   Name: Jocelyn Hill MRN: [id number] DOB: 02-18-1951    ADMISSION DATE:  03/12/2017 CONSULTATION DATE:  03/16/17  REFERRING MD:  Wynetta Emery  CHIEF COMPLAINT:  SOB  HISTORY OF PRESENT ILLNESS:  Pt is encephelopathic; therefore, this HPI is obtained from chart review. Jocelyn Hill is a 66 y.o. female with PMH as outlined below. She was admitted 03/12/17 with SOB. Symptoms that started the Saturday prior, 9/8 and had progressed to the point where she cannot take it any longer so went to [facility] for further evaluation. CXR demonstrated bilateral pleural effusions and BNP was noted to be elevated at 1700. She was also placed on BiPAP due to increased work of breathing. Due to worsening renal function and new diagnosis of CHF, she was subsequently transferred to [facility] for further evaluation and management.  Upon arrival to [facility], she was evaluated by cardiology and had her Lasix increased. Cardiology had also planned for TEE once patient was further stabilized. She was later also evaluated by nephrology who also recommended continuing with diuresis.  On afternoon of 9/17, patient developed  bradycardia (heart rates in the 50s and 60s), hypotension (SBP 60s and 70s) and altered mental status.  Patient was subsequently intubated for airway protection by anesthesia and was then transferred to the ICU for further evaluation and management.  CXR from a.m. 9/17 had minimal change from prior (cardiomegaly, bilateral pleural effusions, mild pulmonary edema).  Echo from 9/13 showed EF of 60-65%,  G2DD, PAP 60, small to moderate pericardial effusion.  ABG just prior to intubation and mild hypoxemia (7.34 / 32 / 48).  After arrival to ICU, EKG c/w significant sinus bradycardia.  Started on dopamine + levophed.   PAST MEDICAL HISTORY :  She  has a past medical history of Anxiety; Arthritis; Bronchitis; Depression; Diabetes mellitus without complication  ([place]); Diet-controlled diabetes mellitus ([place]); GERD (gastroesophageal reflux disease); Hyperlipidemia; Hypertension; Hypothyroidism; and Stroke [facility]).  PAST SURGICAL HISTORY: She  has a past surgical history that includes Abdominal hysterectomy; Tubal ligation; and Tonsillectomy.  Allergies  Allergen Reactions  . Ace Inhibitors Anaphylaxis and Swelling    No current facility-administered medications on file prior to encounter.    No current outpatient prescriptions on file prior to encounter.    FAMILY HISTORY:  Her has no family status information on file.    SOCIAL HISTORY: She  reports that she has been smoking Cigarettes.  She has never used smokeless tobacco. She reports that she does not drink alcohol or use drugs.  REVIEW OF SYSTEMS:  Unable to obtain as patient is encephalopathic.  SUBJECTIVE:  On vent, unresponsive.  VITAL SIGNS: BP 100/68   Pulse 65   Temp 98.4 F (36.9 C) (Oral)   Resp 18   Ht 5\' 9"  (1.753 m)   Wt 67 kg (147 lb 11.3 oz)   SpO2 94%   BMI 21.81 kg/m   HEMODYNAMICS:  Levophed @ 10; Dopamine weaned off  VENTILATOR SETTINGS:  PRVC, R 14, Vt 530, PEEP 5, FIO2 100%  INTAKE / OUTPUT: I/O last 3 completed shifts: In: 11 [P.O.:240; IV Piggyback:250] Out: 4098 [[id number]]  PHYSICAL EXAMINATION: General: Adult female, in NAD. Neuro: Sedated, non-responsive. HEENT: [place]/AT. PERRL, sclerae anicteric. Cardiovascular: RRR, 3/6 SEM. + JVD. Lungs: Respirations even and unlabored.  CTA bilaterally, No W/R/R. Abdomen: BS x 4, soft, NT/ND.  Musculoskeletal: No gross deformities, no edema.  Skin: Intact, warm, no rashes.  LABS:  BMET  Recent  Labs Lab 03/14/17 0320 03/15/17 0222 03/16/17 0758  NA 139 134* 129*  K 3.6 4.3 5.1  CL 104 100* 96*  CO2 30 22 19*  BUN 32* 37* 60*  CREATININE 3.52* 3.53* 4.83*  GLUCOSE 124* 165* 185*    Electrolytes  Recent Labs Lab 03/14/17 0313 03/14/17 0320 03/15/17 0222 03/16/17 0758  CALCIUM  --   8.7* 8.7* 8.4*  MG 1.7  --  1.7  --   PHOS  --   --   --  6.9*    CBC  Recent Labs Lab 03/12/17 1400 03/15/17 0222 03/16/17 0758  WBC 8.4 6.2 11.5*  HGB 9.6* 9.4* 10.7*  HCT 28.0* 26.9* 30.3*  PLT 275 236 270   Coag's No results for input(s): APTT, INR in the last 168 hours.  Sepsis Markers No results for input(s): LATICACIDVEN, PROCALCITON, O2SATVEN in the last 168 hours.  ABG  Recent Labs Lab 03/16/17 1226  PHART 7.344*  PCO2ART 32.1  PO2ART 48.4*   Liver Enzymes  Recent Labs Lab 03/15/17 0222 03/16/17 0758  AST 18  --   ALT 23  --   ALKPHOS 83  --   BILITOT 0.9  --   ALBUMIN 3.0* 3.0*   Cardiac Enzymes  Recent Labs Lab 03/14/17 1501 03/14/17 2026 03/15/17 0222  TROPONINI 0.03* 0.03* <0.03   Glucose  Recent Labs Lab 03/15/17 0713 03/15/17 1134 03/15/17 1556 03/15/17 2120 03/16/17 0817 03/16/17 1143  GLUCAP 308* 242* 234* 262* 181* 255*   Imaging Dg Chest Port 1 View  Result Date: 03/16/2017 CLINICAL DATA:  Increased SOB, CP. Abdominal pain and constipation. No BM in 48 hours. Hx of DM, HTN, stroke. Smoker. EXAM: PORTABLE CHEST 1 VIEW COMPARISON:  03/13/2017 FINDINGS: Stable cardiomegaly. Stable bilateral pleural effusions and bibasilar atelectasis. Stable pulmonary interstitial prominence, consistent with mild interstitial edema. No pneumothorax visualized. IMPRESSION: No significant change in cardiomegaly, bilateral pleural effusions, and probable mild pulmonary interstitial edema Electronically Signed   By: Earle Gell M.D.   On: 03/16/2017 08:16   Dg Abd Portable 1v  Result Date: 03/16/2017 CLINICAL DATA:  Abdominal pain.  Constipation. EXAM: PORTABLE ABDOMEN - 1 VIEW COMPARISON:  None. FINDINGS: The bowel gas pattern is normal with minimal air scattered throughout bowel. No excessive stool in the colon. No fecal impaction. Extensive calcification in the abdominal aorta and iliac arteries. No significant bone abnormality. IMPRESSION:  Benign-appearing abdomen.  No excessive stool in the colon. Aortic atherosclerosis. Electronically Signed   By: Lorriane Shire M.D.   On: 03/16/2017 08:17    STUDIES:  TTE 9/13 > LVEF 60-65%, no RWMA, G2DD, severe MR, mildly dilated LA and RA, mild to mod TR, PAP 60 mm Hg. TEE 9/18 >  CXR 03/16/17 >> no sign change in cardiomegaly, bilateral pleural effusions, and probable mild pulmonary interstitial edema  CULTURES: 9/16 MRSA PCR >> neg  ANTIBIOTICS: 9/17 Azithromycin >> 9/17  SIGNIFICANT EVENTS: 9/13 Admit 9/17 Brady/hypotensive/ intubated > transfer to ICU  LINES/TUBES: 9/17 ETT  DISCUSSION: 66 y.o. F admitted 9/13 with SOB and new dCHF and severe MR.  Was being diuresed then PM 9/17 developedbradycardia with CHB, hypotension, AMS with inability to protect the airway.  Subsequently required intubation and was then transferred to ICU.  After arrival to ICU, EKG c/w significant sinus bradycardia.  Started on dopamine + levophed.  ASSESSMENT / PLAN:  PULMONARY A: Acute respiratory insufficiency - in the setting of acute bradycardia/hypotension Concern for COPD exacerbation - not convinced at this point  as appears to be more cardiogenic P:   Full vent support, PRVC 8cc/kg ABG in 1 hour post intubation VAP protocol Daily SBT if meets criteria Continue BD's for now Hold Singular for now Stop predisone (not wheezing- doubt AE COPD) CXR now  CARDIOVASCULAR A:  Bradycardia with hypotension - EKG now with significant sinus brady Severe MR, mild-mod TR Acute diastolic HF - H0WC, EF 37-62% HTN, HLD P:  Cards following, have asked them to re-assess this PM for consideration temp pacer Holding diuresis for now given hypotension/ elevated sCr Start dopamine for goal MAP > 65 Adding levophed now given worsening BP  RENAL A:   Hyponatremia AoCKD- r/t hypotension Hx CKD stage 3 - net -4L P:   Nephrology following  Trend BMP / mag/phos/ urinary output Replace  electrolytes as indicated Daily weights  GASTROINTESTINAL A:   GI prophylaxis Nutrition P:   PPI for SUP NPO Bowel regmen Hold miralax  HEMATOLOGIC A:   Chronic anemia P:  Trend CBC Hold ferrous sulfate Lovenox for VTE ppx  INFECTIOUS A:   No obvious source of infection  P:   D/c zithromax Trend WBC/ fever curve Assess PCT  ENDOCRINE A:   DMT2 Hypothyroid  P:   SSI Continue synthroid per tube  NEUROLOGIC A:   Acute encephalopathy - related to hypoperfusion/bradycardia Hx CVA, Depression, Chronic pain P:   RASS goal: 0/-1 PRN fentanyl / versed Daily WUA Hold ambien, ultram, zanaflex, lyrica  FAMILY  - Updates: Daughter updated by Dr. Marlou Porch.  - Inter-disciplinary family meet or Palliative Care meeting due by:  03/23/17  CCT: 45 min.   Montey Hora, [facility] Pager: [phone]  or [phone] 03/16/2017, 1:50 PM   Attending Addendum: I personally examined this patient and agree with plan as detailed above. 66yoF with severe MR, Pulmonary HTN, DM, Hypothyroidism, Anemia, and dCHF, admitted with dCHF exacerbation, now diuresed 4L net negative, also started on 3 anti-hypertensives, leading to rapid response today when patient became severely hypotensive and bradycardic with associated respiratory failure requiring intubation due to not protecting her airway. Patient given Atropine and started on Dopamine gtt without response. Transitioned to levophed gtt with improvement in her HR and BP. Cardiology consulted and felt she was likely intravascularly dry; gave 500cc NS bolus. Antihypertensives held. Planning on TEE in the AM. Will check CVP. Post-intubation ABG showed hypoxia but CXR showed no infiltrates and significant improvement in her previously seen effusions. Will increase PEEP in hope of being able to wean FIO2 somewhat. AKI-on-CKD possibly due to overdiuresis possibly contribution of ATN from the  shock. Monitor UOP closely.   45 minutes nonprocedural critical care time  Vernie Murders, MD Pulmonary & Critical care medicine

## 2017-03-16 NOTE — Progress Notes (Signed)
[facility] Progress Note  Subjective: hypotension into 60's and 70's this am , bradycardic but very lethargic.  Rapid response called, getting NS bolus and got atropine.    Vitals:   03/16/17 0100 03/16/17 0200 03/16/17 0300 03/16/17 0331  BP: 114/80 120/81 100/68   Pulse: 66 66 65   Resp: 18 17 18    Temp:   97.9 F (36.6 C)   TempSrc:      SpO2: 94% 92% 94%   Weight:    67 kg (147 lb 11.3 oz)  Height:        Inpatient medications: . aspirin EC  81 mg Oral Daily  . atorvastatin  40 mg Oral Daily  . atropine      . azithromycin  250 mg Oral Daily  . buPROPion  300 mg Oral Daily  . busPIRone  15 mg Oral Daily  . docusate sodium  100 mg Oral BID  . DULoxetine  30 mg Oral Daily  . enoxaparin (LOVENOX) injection  30 mg Subcutaneous Q24H  . ferrous sulfate  325 mg Oral Q breakfast  . gabapentin  300 mg Oral QODAY  . hydrALAZINE  25 mg Oral TID  . insulin aspart  0-9 Units Subcutaneous TID WC  . insulin glargine  12 Units Subcutaneous Daily  . ipratropium-albuterol  3 mL Nebulization TID  . levothyroxine  112 mcg Oral QAC breakfast  . mouth rinse  15 mL Mouth Rinse BID  . montelukast  10 mg Oral QHS  . pantoprazole  40 mg Oral Daily  . polyethylene glycol  17 g Oral Daily  . predniSONE  40 mg Oral Q breakfast  . pregabalin  150 mg Oral QODAY  . sodium chloride flush  3 mL Intravenous Q12H  . tiZANidine  4 mg Oral TID   . sodium chloride     sodium chloride, acetaminophen **OR** acetaminophen, albuterol, ALPRAZolam, famotidine, oxyCODONE, sodium chloride flush, traMADol, zolpidem  Exam: Poorly responsive, will grip to loud voice; on bipap +JVD Chest clear ant/ lat Cor bradycardic, irregular, no S3 Abd soft ntnd no ascites Ext no edema NF poorly responsive      Impression: Gen alert, tired No rash, cyanosis or gangrene Sclera anicteric, throat clear  No jvd or bruits, JVP 10-14 Chest bibasilar rales, L base bronchial BS RRR 2/6 sem, no RG Abd  soft ntnd no mass or ascites +bs GU defer MS no joint effusions or deformity Ext focal LUE edema, no LE edema, no wounds or ulcers Neuro is alert, Ox 3 , nf   Home meds :   - norvasc/ hydralazine 100 tid/ labetalol 200 bid/ minoxidil 2.5 qd - albuterol/ asa/ lipitor/ T4 / singulair/ PPI / KCl  - wellbutrin / cymbalta/ neurontin/ Lyrica / ambien prn / oxy prn   CXR 9-14 > +CHF moderate I/O - 1.1 L in and 4.7 L out since admission on 9/13 Creat 3.1- 3.5 range here this admission, no old creat ECHO - LVEF 60%, severe MR, G2DD, mild-mod TR, severe LAE, PA peak 60 mm   Impression/Rec: 1.  CKD stage IV - baseline creat 2.9 recently, seen by [facility].  Creat ^ today prob due to hypotension . Will follow. 2.  Unstable bradycardia - pt going to ICU 3.  CHF/ vol overload - pt hypotensive, hold lasix for now 4.  HTN - hypotension now, have dc'd all BP meds for now 5.  DM2 - less than 10 yrs, took pills only, currenlty no meds 6.  Psych/ pain - multiple meds for depression/ pain 7.  Hx CVA   Kelly Splinter MD [facility] pager [phone]   03/16/2017, 12:18 PM    Recent Labs Lab 03/14/17 0320 03/15/17 0222 03/16/17 0758  NA 139 134* 129*  K 3.6 4.3 5.1  CL 104 100* 96*  CO2 30 22 19*  GLUCOSE 124* 165* 185*  BUN 32* 37* 60*  CREATININE 3.52* 3.53* 4.83*  CALCIUM 8.7* 8.7* 8.4*  PHOS  --   --  6.9*    Recent Labs Lab 03/15/17 0222 03/16/17 0758  AST 18  --   ALT 23  --   ALKPHOS 83  --   BILITOT 0.9  --   PROT 5.7*  --   ALBUMIN 3.0* 3.0*    Recent Labs Lab 03/12/17 1400 03/15/17 0222 03/16/17 0758  WBC 8.4 6.2 11.5*  HGB 9.6* 9.4* 10.7*  HCT 28.0* 26.9* 30.3*  MCV 77.3* 76.6* 75.8*  PLT 275 236 270   Iron/TIBC/Ferritin/ %Sat    Component Value Date/Time   IRON 19 (L) 03/14/2017 0320   TIBC 228 (L) 03/14/2017 0320   FERRITIN 41 03/14/2017 0320   IRONPCTSAT 8 (L) 03/14/2017 0320

## 2017-03-16 NOTE — Care Management Important Message (Signed)
Important Message  Patient Details  Name: Jocelyn Hill. Volk MRN: [id number] Date of Birth: 08-19-1950   Medicare Important Message Given:  Yes    Nathen May 03/16/2017, 10:43 AM

## 2017-03-16 NOTE — Progress Notes (Signed)
Progress Note  Patient Name: Jocelyn Hill. Ritson Date of Encounter: 03/16/2017  Primary Cardiologist: [facility]   Subjective   Currently intubated, sedate  Inpatient Medications    Scheduled Meds: . aspirin  81 mg Per Tube Daily  . [START ON 03/17/2017] atorvastatin  40 mg Per Tube Daily  . atropine      . chlorhexidine gluconate (MEDLINE KIT)  15 mL Mouth Rinse BID  . heparin subcutaneous  5,000 Units Subcutaneous Q8H  . insulin aspart  0-15 Units Subcutaneous Q4H  . ipratropium-albuterol  3 mL Nebulization Q6H  . [START ON 03/17/2017] levothyroxine  112 mcg Per Tube QAC breakfast  . mouth rinse  15 mL Mouth Rinse QID  . pantoprazole (PROTONIX) IV  40 mg Intravenous Daily  . sodium chloride flush  3 mL Intravenous Q12H   Continuous Infusions: . sodium chloride    . DOPamine     PRN Meds: sodium chloride, acetaminophen **OR** acetaminophen, albuterol, bisacodyl, docusate, fentaNYL (SUBLIMAZE) injection, midazolam, sodium chloride flush   Vital Signs    Vitals:   03/16/17 1345 03/16/17 1400 03/16/17 1415 03/16/17 1430  BP: (!) 64/48 (!) 68/51 92/60 98/62   Pulse:      Resp: 16 15 16 16   Temp:      TempSrc:      SpO2:      Weight:      Height:        Intake/Output Summary (Last 24 hours) at 03/16/17 1437 Last data filed at 03/16/17 1100  Gross per 24 hour  Intake              480 ml  Output              970 ml  Net             -490 ml   Filed Weights   03/15/17 0500 03/15/17 2027 03/16/17 0331  Weight: 149 lb 14.6 oz (68 kg) 147 lb 14.4 oz (67.1 kg) 147 lb 11.3 oz (67 kg)    Telemetry    Previously severe sinus bradycardia with junctional escape, group beating, occasional retrograde P noted. After Levophed and dopamine P wave returned- Personally Reviewed  ECG    As described above, no ST segment changes - Personally Reviewed  Physical Exam   GEN: Sedate .   Neck: jaw line JVD Cardiac: RRR, previously bradycardic with frequent ectopy, 2/6 apical  systolic/holosystolic murmur,no  rubs, or gallops.  Respiratory:  ventilator noise bilat. GI: Soft, nontender, non-distended  MS: No edema; No deformity. Neuro:   sedate  Psych: Sedate  Labs    Chemistry Recent Labs Lab 03/14/17 0320 03/15/17 0222 03/16/17 0758  NA 139 134* 129*  K 3.6 4.3 5.1  CL 104 100* 96*  CO2 30 22 19*  GLUCOSE 124* 165* 185*  BUN 32* 37* 60*  CREATININE 3.52* 3.53* 4.83*  CALCIUM 8.7* 8.7* 8.4*  PROT  --  5.7*  --   ALBUMIN  --  3.0* 3.0*  AST  --  18  --   ALT  --  23  --   ALKPHOS  --  83  --   BILITOT  --  0.9  --   GFRNONAA 13* 13* 9*  GFRAA 15* 15* 10*  ANIONGAP 5 12 14      Hematology Recent Labs Lab 03/12/17 1400 03/15/17 0222 03/16/17 0758  WBC 8.4 6.2 11.5*  RBC 3.62* 3.51* 4.00  HGB 9.6* 9.4* 10.7*  HCT 28.0* 26.9* 30.3*  MCV 77.3* 76.6* 75.8*  MCH 26.5 26.8 26.8  MCHC 34.3 34.9 35.3  RDW 14.1 13.8 14.0  PLT 275 236 270    Cardiac Enzymes Recent Labs Lab 03/13/17 0202 03/14/17 1501 03/14/17 2026 03/15/17 0222  TROPONINI 0.04* 0.03* 0.03* <0.03   No results for input(s): TROPIPOC in the last 168 hours.   BNP Recent Labs Lab 03/12/17 1400  BNP 2,230.5*     DDimer No results for input(s): DDIMER in the last 168 hours.   Radiology    Portable Chest Xray  Result Date: 03/16/2017 CLINICAL DATA:  66 year old female who coded today status post CPR EXAM: PORTABLE CHEST 1 VIEW COMPARISON:  0759 hours today and earlier. FINDINGS: Portable AP semi upright view at at 1304 hours. Intubated. Endotracheal tube tip in good position between the level the clavicles and carina. Enteric tube placed and courses to the abdomen. Side hole is at the level of the gastric body. Left and midline chest pacer or resuscitation pads in place. Stable cardiomegaly and mediastinal contours. Continued veiling opacity at both lung bases. No pneumothorax. Stable pulmonary vascularity, no acute edema. No areas of worsening ventilation. No displaced  rib fracture or acute osseous abnormality identified. IMPRESSION: 1. Endotracheal tube tip in good position. Enteric tube courses to the abdomen, side hole at the level of the stomach. 2. Stable ventilation from earlier today; bilateral pleural effusions with lower lobe collapse or consolidation. 3. Stable cardiomegaly and mediastinal contours. Electronically Signed   By: Genevie Ann M.D.   On: 03/16/2017 13:27   Dg Chest Port 1 View  Result Date: 03/16/2017 CLINICAL DATA:  Increased SOB, CP. Abdominal pain and constipation. No BM in 48 hours. Hx of DM, HTN, stroke. Smoker. EXAM: PORTABLE CHEST 1 VIEW COMPARISON:  03/13/2017 FINDINGS: Stable cardiomegaly. Stable bilateral pleural effusions and bibasilar atelectasis. Stable pulmonary interstitial prominence, consistent with mild interstitial edema. No pneumothorax visualized. IMPRESSION: No significant change in cardiomegaly, bilateral pleural effusions, and probable mild pulmonary interstitial edema Electronically Signed   By: Earle Gell M.D.   On: 03/16/2017 08:16   Dg Abd Portable 1v  Result Date: 03/16/2017 CLINICAL DATA:  Abdominal pain.  Constipation. EXAM: PORTABLE ABDOMEN - 1 VIEW COMPARISON:  None. FINDINGS: The bowel gas pattern is normal with minimal air scattered throughout bowel. No excessive stool in the colon. No fecal impaction. Extensive calcification in the abdominal aorta and iliac arteries. No significant bone abnormality. IMPRESSION: Benign-appearing abdomen.  No excessive stool in the colon. Aortic atherosclerosis. Electronically Signed   By: Lorriane Shire M.D.   On: 03/16/2017 08:17    Cardiac Studies   Echocardiogram 03/12/17 Study Conclusions  - Left ventricle: The cavity size was normal. Wall thickness was   normal. Systolic function was normal. The estimated ejection   fraction was in the range of 60% to 65%. Wall motion was normal;   there were no regional wall motion abnormalities. Features are   consistent with a  pseudonormal left ventricular filling pattern,   with concomitant abnormal relaxation and increased filling   pressure (grade 2 diastolic dysfunction). - Mitral valve: There was severe regurgitation. - Left atrium: The atrium was severely dilated. - Right atrium: The atrium was mildly dilated. - Tricuspid valve: There was mild-moderate regurgitation. - Pulmonary arteries: Systolic pressure was moderately increased.   PA peak pressure: 60 mm Hg (S). - Pericardium, extracardiac: A small to moderate pericardial   effusion was identified. There was no evidence of hemodynamic   compromise.  Patient Profile  66 y.o. female with severe mitral regurgitation, severely dilated left atrium, moderately elevated pulmonary artery pressures, normal left ventricular ejection fraction, chronic kidney disease with acute hypoxia, acute hypotension requiring pressors, acute bradycardia responding to norepinephrine and dopamine  Assessment & Plan    Severe mitral regurgitation  - Acute hypotension occurred, this was after approximately 4 L negative through admission with IV Lasix and continued administration of labetalol throughout this admission. Labs are indicative of hypoperfusion with BUN increase as well as creatinine increase. Hypoxia noted on blood gas - intubated.   - Acute bedside echocardiogram was performed which demonstrates normal-appearing left ventricular ejection fraction, no increase in pericardial effusion, severe mitral regurgitation noted, IVC dilated.  - We administered dopamine first and then norepinephrine. Heart rate gradually improved, currently sinus rhythm in the 70s with blood pressure also improving from the 65W systolic to now the 56Z. She was also given a bolus of IV fluids 500 mL.  - Discussion with critical care team. Arterial line being placed.  - I also placed a call to [facility] to have them evaluate.   - She seems to have stabilized over the past hour. I'm concerned that she  may need potential mitral valve surgery sooner rather than later.  - She does not have an angiogram currently. We were planning on proceeding with transesophageal echocardiogram tomorrow prior to her acute decompensation.  - Renal function acutely worsened today. Wound will have to weigh risks versus benefits of angiography versus early surgery.  Critcal care time one hour: Spent with patient, discussion with patient's daughter, nursing team, critical care team, extensive data review, portable bedside echocardiogram in this woman that was acutely hypoxic/hypotensive with severe mitral regurgitation.    For questions or updates, please contact [place] Please consult [email or web address] for contact info under Cardiology/STEMI.      Signed, Jocelyn Furbish, MD  03/16/2017, 2:37 PM

## 2017-03-16 NOTE — Progress Notes (Signed)
eLink Physician-Brief Progress Note Patient Name: Jocelyn Hill. Schnelle DOB: August 11, 1950 MRN: [id number]   Date of Service  03/16/2017  HPI/Events of Note  CVP = 6.  eICU Interventions  Will bolus with 0.9 NaCl 1 liter IV over 1 hour now.      Intervention Category Major Interventions: Hypovolemia - evaluation and treatment with fluids  Sommer,Steven Eugene 03/16/2017, 9:30 PM

## 2017-03-17 ENCOUNTER — Encounter (HOSPITAL_COMMUNITY)
Admission: EM | Disposition: A | Discharge status: Skilled Nursing Facility | Payer: Self-pay | Source: Other Acute Inpatient Hospital | Attending: Internal Medicine

## 2017-03-17 ENCOUNTER — Inpatient Hospital Stay (HOSPITAL_COMMUNITY): Payer: Medicare Other

## 2017-03-17 DIAGNOSIS — I340 Nonrheumatic mitral (valve) insufficiency: Secondary | ICD-10-CM

## 2017-03-17 LAB — POCT I-STAT 3, ART BLOOD GAS (G3+)
Acid-base deficit: 1 mmol/L (ref 0.0–2.0)
Bicarbonate: 21.4 mmol/L (ref 20.0–28.0)
O2 Saturation: 95 %
Patient temperature: 37.2
TCO2: 22 mmol/L (ref 22–32)
pCO2 arterial: 28.5 mmHg — ABNORMAL LOW (ref 32.0–48.0)
pH, Arterial: 7.484 — ABNORMAL HIGH (ref 7.350–7.450)
pO2, Arterial: 70 mmHg — ABNORMAL LOW (ref 83.0–108.0)

## 2017-03-17 LAB — GLUCOSE, CAPILLARY
Glucose-Capillary: 99 mg/dL (ref 65–99)
Glucose-Capillary: 220 mg/dL — ABNORMAL HIGH (ref 65–99)
Glucose-Capillary: 169 mg/dL — ABNORMAL HIGH (ref 65–99)
Glucose-Capillary: 99 mg/dL (ref 65–99)
Glucose-Capillary: 98 mg/dL (ref 65–99)
Glucose-Capillary: 124 mg/dL — ABNORMAL HIGH (ref 65–99)

## 2017-03-17 LAB — RENAL FUNCTION PANEL
Albumin: 3.2 g/dL — ABNORMAL LOW (ref 3.5–5.0)
Anion gap: 10 (ref 5–15)
BUN: 65 mg/dL — ABNORMAL HIGH (ref 6–20)
CO2: 21 mmol/L — ABNORMAL LOW (ref 22–32)
Calcium: 8.2 mg/dL — ABNORMAL LOW (ref 8.9–10.3)
Chloride: 101 mmol/L (ref 101–111)
Creatinine, Ser: 5 mg/dL — ABNORMAL HIGH (ref 0.44–1.00)
GFR calc Af Amer: 10 mL/min — ABNORMAL LOW (ref >60)
GFR calc non Af Amer: 8 mL/min — ABNORMAL LOW (ref >60)
Glucose, Bld: 99 mg/dL (ref 65–99)
Phosphorus: 6.3 mg/dL — ABNORMAL HIGH (ref 2.5–4.6)
Potassium: 4.3 mmol/L (ref 3.5–5.1)
Sodium: 132 mmol/L — ABNORMAL LOW (ref 135–145)

## 2017-03-17 LAB — CBC
HCT: 29.2 % — ABNORMAL LOW (ref 36.0–46.0)
Hemoglobin: 10.3 g/dL — ABNORMAL LOW (ref 12.0–15.0)
MCH: 26.1 pg (ref 26.0–34.0)
MCHC: 35.3 g/dL (ref 30.0–36.0)
MCV: 73.9 fL — ABNORMAL LOW (ref 78.0–100.0)
Platelets: 287 10*3/uL (ref 150–400)
RBC: 3.95 MIL/uL (ref 3.87–5.11)
RDW: 13.3 % (ref 11.5–15.5)
WBC: 13.6 10*3/uL — ABNORMAL HIGH (ref 4.0–10.5)

## 2017-03-17 LAB — PROTIME-INR
INR: 1.53
Prothrombin Time: 18.3 seconds — ABNORMAL HIGH (ref 11.4–15.2)

## 2017-03-17 LAB — MAGNESIUM: Magnesium: 2.4 mg/dL (ref 1.7–2.4)

## 2017-03-17 SURGERY — ECHOCARDIOGRAM, TRANSESOPHAGEAL
Anesthesia: Monitor Anesthesia Care

## 2017-03-17 MED ORDER — HYDRALAZINE HCL 20 MG/ML IJ SOLN
5.0000 mg | Freq: Once | INTRAMUSCULAR | Status: AC
Start: 2017-03-17 — End: 2017-03-17
  Administered 2017-03-17: 5 mg via INTRAVENOUS
  Filled 2017-03-17: qty 1

## 2017-03-17 MED ORDER — FENTANYL CITRATE (PF) 100 MCG/2ML IJ SOLN
INTRAMUSCULAR | Status: AC
  Administered 2017-03-17: 50 ug via INTRAVENOUS
  Filled 2017-03-17: qty 2

## 2017-03-17 MED ORDER — SODIUM CHLORIDE 0.9% FLUSH: 10.0000 mL | INTRAVENOUS | Status: DC | PRN

## 2017-03-17 MED ORDER — SENNOSIDES 8.8 MG/5ML PO SYRP
5.0000 mL | ORAL_SOLUTION | Freq: Two times a day (BID) | ORAL | Status: DC | PRN
  Administered 2017-03-18 – 2017-03-19 (×2): 5 mL
  Filled 2017-03-17 (×2): qty 5

## 2017-03-17 MED ORDER — HYDRALAZINE HCL 20 MG/ML IJ SOLN
0.0000 mg | INTRAMUSCULAR | Status: DC | PRN
  Administered 2017-03-18 (×2): 20 mg via INTRAVENOUS
  Filled 2017-03-17 (×2): qty 1

## 2017-03-17 MED ORDER — SODIUM CHLORIDE 0.9 % IV SOLN
0.4000 ug/kg/h | INTRAVENOUS | Status: DC
  Administered 2017-03-17: 0.1 ug/kg/h via INTRAVENOUS
  Administered 2017-03-17: 0.4 ug/kg/h via INTRAVENOUS
  Administered 2017-03-18: 0.2 ug/kg/h via INTRAVENOUS
  Filled 2017-03-17 (×3): qty 2

## 2017-03-17 MED ORDER — MIDAZOLAM HCL 2 MG/2ML IJ SOLN
INTRAMUSCULAR | Status: AC
  Administered 2017-03-17: 2 mg via INTRAVENOUS
  Filled 2017-03-17: qty 2

## 2017-03-17 MED ORDER — MIDAZOLAM HCL 2 MG/2ML IJ SOLN
2.0000 mg | INTRAMUSCULAR | Status: DC | PRN
  Filled 2017-03-17 (×2): qty 2

## 2017-03-17 MED ORDER — FENTANYL CITRATE (PF) 100 MCG/2ML IJ SOLN
50.0000 ug | Freq: Once | INTRAMUSCULAR | Status: AC
  Administered 2017-03-17: 50 ug via INTRAVENOUS

## 2017-03-17 MED ORDER — FENTANYL CITRATE (PF) 100 MCG/2ML IJ SOLN
25.0000 ug | INTRAMUSCULAR | Status: DC | PRN
  Administered 2017-03-17 (×2): 50 ug via INTRAVENOUS
  Filled 2017-03-17: qty 2

## 2017-03-17 MED ORDER — CHLORHEXIDINE GLUCONATE CLOTH 2 % EX PADS
6.0000 | MEDICATED_PAD | Freq: Every day | CUTANEOUS | Status: DC
  Administered 2017-03-17 – 2017-03-20 (×4): 6 via TOPICAL

## 2017-03-17 MED ORDER — MIDAZOLAM HCL 2 MG/2ML IJ SOLN
1.0000 mg | INTRAMUSCULAR | Status: DC | PRN
  Administered 2017-03-17: 2 mg via INTRAVENOUS

## 2017-03-17 MED ORDER — ORAL CARE MOUTH RINSE
15.0000 mL | Freq: Four times a day (QID) | OROMUCOSAL | Status: DC
  Administered 2017-03-17 – 2017-03-22 (×18): 15 mL via OROMUCOSAL

## 2017-03-17 MED ORDER — MIDAZOLAM HCL 2 MG/2ML IJ SOLN
2.0000 mg | INTRAMUSCULAR | Status: DC | PRN
  Administered 2017-03-17 (×2): 2 mg via INTRAVENOUS

## 2017-03-17 MED ORDER — CHLORHEXIDINE GLUCONATE 0.12% ORAL RINSE (MEDLINE KIT)
15.0000 mL | Freq: Two times a day (BID) | OROMUCOSAL | Status: DC
  Administered 2017-03-17 – 2017-03-22 (×10): 15 mL via OROMUCOSAL

## 2017-03-17 MED ORDER — DOCUSATE SODIUM 50 MG/5ML PO LIQD
100.0000 mg | Freq: Two times a day (BID) | ORAL | Status: DC
  Administered 2017-03-17 – 2017-03-20 (×6): 100 mg
  Filled 2017-03-17 (×6): qty 10

## 2017-03-17 MED ORDER — SODIUM CHLORIDE 0.9 % IV SOLN
INTRAVENOUS | Status: DC
  Administered 2017-03-17: 20 mL/h via INTRAVENOUS

## 2017-03-17 MED ORDER — FENTANYL BOLUS VIA INFUSION
50.0000 ug | INTRAVENOUS | Status: DC | PRN
  Filled 2017-03-17: qty 50

## 2017-03-17 MED ORDER — FENTANYL 2500MCG IN NS 250ML (10MCG/ML) PREMIX INFUSION
25.0000 ug/h | INTRAVENOUS | Status: DC
  Administered 2017-03-17: 50 ug/h via INTRAVENOUS
  Filled 2017-03-17 (×2): qty 250

## 2017-03-17 MED ORDER — SODIUM CHLORIDE 0.9% FLUSH
10.0000 mL | Freq: Two times a day (BID) | INTRAVENOUS | Status: DC
  Administered 2017-03-17 – 2017-03-18 (×4): 10 mL
  Administered 2017-03-19: 20 mL
  Administered 2017-03-21 – 2017-04-04 (×26): 10 mL

## 2017-03-17 NOTE — Progress Notes (Signed)
eLink Physician-Brief Progress Note Patient Name: Jocelyn Hill. Massimo DOB: 09/02/1950 MRN: [id number]   Date of Service  03/17/2017  HPI/Events of Note  Patient remains off vasopressors. Agitation with patient reaching for the endotracheal tube.   eICU Interventions  1. Precedex drip 2. Liberalizing Versed and fentanyl IV pushes 3. Nurse to confirm blood pressure with manual cuff      Intervention Category Major Interventions: Hypertension - evaluation and management;Delirium, psychosis, severe agitation - evaluation and management  Tera Partridge 03/17/2017, 6:21 AM

## 2017-03-17 NOTE — Progress Notes (Signed)
[facility] MD notified of increase bld pressure 160/100's since pressors have been paused since 0400.  Orders received.  Will continue to monitor closely.  Will give hydralazine per order at 0530 if increase BP persists.

## 2017-03-17 NOTE — Progress Notes (Signed)
PULMONARY / CRITICAL CARE MEDICINE   Name: Jocelyn Hill. Dion MRN: [id number] DOB: 1950-12-23    ADMISSION DATE:  03/12/2017 CONSULTATION DATE:  03/16/17  REFERRING MD:  Wynetta Emery  CHIEF COMPLAINT:  SOB  HISTORY OF PRESENT ILLNESS:  Pt is encephelopathic; therefore, this HPI is obtained from chart review. Jocelyn Sauer. Hill is a 66 y.o. female with PMH as outlined below. She was admitted 03/12/17 with SOB. Symptoms that started the Saturday prior, 9/8 and had progressed to the point where she cannot take it any longer so went to [facility] for further evaluation. CXR demonstrated bilateral pleural effusions and BNP was noted to be elevated at 1700. She was also placed on BiPAP due to increased work of breathing. Due to worsening renal function and new diagnosis of CHF, she was subsequently transferred to [facility] for further evaluation and management.  Upon arrival to [facility], she was evaluated by cardiology and had her Lasix increased. Cardiology had also planned for TEE once patient was further stabilized. She was later also evaluated by nephrology who also recommended continuing with diuresis.  On afternoon of 9/17, patient developed  bradycardia (heart rates in the 50s and 60s), hypotension (SBP 60s and 70s) and altered mental status.  Patient was subsequently intubated for airway protection by anesthesia and was then transferred to the ICU for further evaluation and management.  CXR from a.m. 9/17 had minimal change from prior (cardiomegaly, bilateral pleural effusions, mild pulmonary edema).  Echo from 9/13 showed EF of 60-65%,  G2DD, PAP 60, small to moderate pericardial effusion.  ABG just prior to intubation and mild hypoxemia (7.34 / 32 / 48).  After arrival to ICU, EKG c/w significant sinus bradycardia.  Started on dopamine + levophed.   SUBJECTIVE:  Off pressors this AM.  CVP 7. TEE planned for today 9/18.   VITAL SIGNS: BP (!) 164/87   Pulse 65   Temp 99 F (37.2 C) (Core  (Comment))   Resp 16   Ht 5\' 9"  (1.753 m)   Wt 67.1 kg (147 lb 14.9 oz)   SpO2 97%   BMI 21.85 kg/m   HEMODYNAMICS: CVP:  [6 mmHg-11 mmHg] 7 mmHg  VENTILATOR SETTINGS: Vent Mode: PRVC FiO2 (%):  [70 %-100 %] 70 % Set Rate:  [14 bmp] 14 bmp Vt Set:  [530 mL] 530 mL PEEP:  [5 cmH20-8 cmH20] 8 cmH20 Plateau Pressure:  [16 cmH20-24 cmH20] 24 cmH20  INTAKE / OUTPUT: I/O last 3 completed shifts: In: 3191.1 [P.O.:480; I.V.:2711.1] Out: 1427 [FFMBW:4665]  PHYSICAL EXAMINATION: General: Adult female, in NAD. Neuro: Sedated, comfortable. HEENT: [place]/AT. PERRL, sclerae anicteric. Cardiovascular: RRR, 3/6 SEM. Lungs: Respirations even and unlabored.  CTA bilaterally, No W/R/R. Abdomen: BS x 4, soft, NT/ND.  Musculoskeletal: No gross deformities, no edema.  Skin: Intact, warm, no rashes.  LABS:  BMET  Recent Labs Lab 03/16/17 0758 03/16/17 1403 03/17/17 0324  NA 129* 129* 132*  K 5.1 5.4* 4.3  CL 96* 98* 101  CO2 19* 18* 21*  BUN 60* 63* 65*  CREATININE 4.83* 5.12* 5.00*  GLUCOSE 185* 228* 99    Electrolytes  Recent Labs Lab 03/15/17 0222 03/16/17 0758 03/16/17 1403 03/17/17 0324  CALCIUM 8.7* 8.4* 7.9* 8.2*  MG 1.7  --  1.9 2.4  PHOS  --  6.9* 7.4* 6.3*    CBC  Recent Labs Lab 03/16/17 0758 03/16/17 1403 03/17/17 0324  WBC 11.5* 12.6* 13.6*  HGB 10.7* 10.2* 10.3*  HCT 30.3* 30.0* 29.2*  PLT 270 258 287   Coag's  Recent Labs Lab 03/17/17 0439  INR 1.53    Sepsis Markers  Recent Labs Lab 03/16/17 1403  PROCALCITON 0.26    ABG  Recent Labs Lab 03/16/17 1226 03/16/17 1554 03/16/17 2013  PHART 7.344* 7.386 7.398  PCO2ART 32.1 34.4 34.3  PO2ART 48.4* 67.0* 109.0*   Liver Enzymes  Recent Labs Lab 03/15/17 0222 03/16/17 0758 03/16/17 1403 03/17/17 0324  AST 18  --  395*  --   ALT 23  --  440*  --   ALKPHOS 83  --  81  --   BILITOT 0.9  --  1.4*  --   ALBUMIN 3.0* 3.0* 2.9* 3.2*   Cardiac Enzymes  Recent Labs Lab  03/14/17 2026 03/15/17 0222 03/16/17 1403  TROPONINI 0.03* <0.03 0.11*   Glucose  Recent Labs Lab 03/15/17 2120 03/16/17 0817 03/16/17 1143 03/16/17 1319 03/16/17 1549 03/17/17 0351  GLUCAP 262* 181* 255* 243* 259* 99   Imaging Dg Chest Port 1 View  Result Date: 03/16/2017 CLINICAL DATA:  Central venous line placement. EXAM: PORTABLE CHEST 1 VIEW COMPARISON:  03/16/2017 at 1304 hours FINDINGS: New right internal jugular central venous introducer sheath has its tip projecting upper superior vena cava. No pneumothorax. Endotracheal tube and nasal/ orogastric tube are unchanged from prior exam, satisfactorily positioned. No change in the cardiomegaly or bilateral lung base opacities/pleural effusions. IMPRESSION: 1. New right internal jugular introducer sheath with its tip in the upper superior vena cava. No pneumothorax. No other change from the earlier study. Electronically Signed   By: Lajean Manes M.D.   On: 03/16/2017 15:16   Portable Chest Xray  Result Date: 03/16/2017 CLINICAL DATA:  66 year old female who coded today status post CPR EXAM: PORTABLE CHEST 1 VIEW COMPARISON:  0759 hours today and earlier. FINDINGS: Portable AP semi upright view at at 1304 hours. Intubated. Endotracheal tube tip in good position between the level the clavicles and carina. Enteric tube placed and courses to the abdomen. Side hole is at the level of the gastric body. Left and midline chest pacer or resuscitation pads in place. Stable cardiomegaly and mediastinal contours. Continued veiling opacity at both lung bases. No pneumothorax. Stable pulmonary vascularity, no acute edema. No areas of worsening ventilation. No displaced rib fracture or acute osseous abnormality identified. IMPRESSION: 1. Endotracheal tube tip in good position. Enteric tube courses to the abdomen, side hole at the level of the stomach. 2. Stable ventilation from earlier today; bilateral pleural effusions with lower lobe collapse or  consolidation. 3. Stable cardiomegaly and mediastinal contours. Electronically Signed   By: Genevie Ann M.D.   On: 03/16/2017 13:27   Dg Chest Port 1 View  Result Date: 03/16/2017 CLINICAL DATA:  Increased SOB, CP. Abdominal pain and constipation. No BM in 48 hours. Hx of DM, HTN, stroke. Smoker. EXAM: PORTABLE CHEST 1 VIEW COMPARISON:  03/13/2017 FINDINGS: Stable cardiomegaly. Stable bilateral pleural effusions and bibasilar atelectasis. Stable pulmonary interstitial prominence, consistent with mild interstitial edema. No pneumothorax visualized. IMPRESSION: No significant change in cardiomegaly, bilateral pleural effusions, and probable mild pulmonary interstitial edema Electronically Signed   By: Earle Gell M.D.   On: 03/16/2017 08:16   Dg Abd Portable 1v  Result Date: 03/16/2017 CLINICAL DATA:  Abdominal pain.  Constipation. EXAM: PORTABLE ABDOMEN - 1 VIEW COMPARISON:  None. FINDINGS: The bowel gas pattern is normal with minimal air scattered throughout bowel. No excessive stool in the colon. No fecal impaction. Extensive calcification in the  abdominal aorta and iliac arteries. No significant bone abnormality. IMPRESSION: Benign-appearing abdomen.  No excessive stool in the colon. Aortic atherosclerosis. Electronically Signed   By: Lorriane Shire M.D.   On: 03/16/2017 08:17    STUDIES:  TTE 9/13 > LVEF 60-65%, no RWMA, G2DD, severe MR, mildly dilated LA and RA, mild to mod TR, PAP 60 mm Hg. TEE 9/18 >  CXR 03/16/17 >> no sign change in cardiomegaly, bilateral pleural effusions, and probable mild pulmonary interstitial edema  CULTURES: 9/16 MRSA PCR >> neg  ANTIBIOTICS: 9/17 Azithromycin >> 9/17  SIGNIFICANT EVENTS: 9/13 Admit 9/17 Brady/hypotensive/ intubated > transfer to ICU  LINES/TUBES: 9/17 ETT  DISCUSSION: 66 y.o. F admitted 9/13 with SOB and new dCHF and severe MR.  Was being diuresed then PM 9/17 developedbradycardia with CHB, hypotension, AMS with inability to protect the  airway.  Subsequently required intubation and was then transferred to ICU.  After arrival to ICU, EKG c/w significant sinus bradycardia.  Started on dopamine + levophed.  ASSESSMENT / PLAN:  PULMONARY A: Acute respiratory insufficiency - in the setting of acute bradycardia/hypotension Concern for COPD exacerbation - not convinced at this point as appears to be more cardiogenic P:   Full vent support, PRVC 8cc/kg VAP protocol Daily SBT if meets criteria, no plans for extubation right now given plans for TEE later today Continue BD's for now Hold Singular for now Prednisone d/c'd 9/17 CXR now  CARDIOVASCULAR A:  Bradycardia with hypotension - EKG now with significant sinus brady.  Resolved AM 9/18 and off pressors. Severe MR, mild-mod TR Acute diastolic HF - P2ZR, EF 00-76% HTN, HLD P:  Cards following CVTS called by cards 9/17 - may need mitral valve repair sooner rather than later Holding diuresis for now given hypotension/ elevated sCr Hydralazine PRN, goal SBP < 170 Goal CVP 8 - 10 Holding pressors as remains normo to hypertensive this AM  RENAL A:   Hyponatremia - improving AoCKD- r/t hypotension Hx CKD stage 3 - net -2L P:   Nephrology following  Trend BMP / mag/phos/ urinary output Replace electrolytes as indicated Daily weights  GASTROINTESTINAL A:   GI prophylaxis Nutrition P:   PPI for SUP NPO Bowel regmen Hold miralax  HEMATOLOGIC A:   Chronic anemia P:  Trend CBC Hold ferrous sulfate Heparin for VTE ppx  INFECTIOUS A:   No obvious source of infection - PCT reassuring P:   Trend WBC/ fever curve  ENDOCRINE A:   DMT2 Hypothyroid  P:   SSI Continue synthroid per tube  NEUROLOGIC A:   Acute encephalopathy - related to hypoperfusion/bradycardia Hx CVA, Depression, Chronic pain P:   RASS goal: 0/-1 PRN fentanyl / versed with precedex gtt Daily WUA Hold ambien, ultram, zanaflex, lyrica  FAMILY  - Updates: Daughter  updated by Dr. Marlou Porch 9/17.  - Inter-disciplinary family meet or Palliative Care meeting due by:  03/23/17  CCT: 30 min.   Montey Hora, [facility] Pager: [phone]  or [phone] 03/17/2017, 7:20 AM  Attending Addendum: I personally examined this patient and agree with plan as detailed above. Shock resolved overnight and off pressors today at 5am. Bradycardia improved. More awake and reaching for ETT overnight so was started on precedex. Still awake and uncomfortable on my exam this AM. RASS +1, obeying commands, but keeps pointing at ETT. Has mitts on hands. Since precedex can cause bradycardia, it is maybe not the best choice  at sedation. Will start fentanyl gtt and wean down the precedex gtt. Remains hypoxic on 70% FIO2 and 8 PEEP this AM. Weaned PEEP to 6; hopefully will tolerate it. CXR this AM on my exam shows slight worsening of pulmonary edema and effusions overnight compared to CXR yesterday AM, which coincides with 1.5L IVF boluses she has received. These were indicated at CVP only 6.0 overnight. CVP improved to 8.0 this AM. Will hold off on any further IVF. Regarding her severe MR and Pulmonary HTN, cardiology plans on TEE today. Afterwards will attempt to wean vent and hopefully extubate her, assuming her hypoxia is improved. Regarding her AKI-on-CKD and Hyponatremia, the sodium improved overnight from 129 to 132 after NS IVF's given. Creatinine improved slightly from 5.12 to 5.0, up from baseline of 2.1. Renal consult is following. Start tube feeds this afternoon if can't extubate after TEE.   45 minutes critical care time  Vernie Murders, MD Pulmonary & Critical Care

## 2017-03-17 NOTE — Progress Notes (Signed)
Pt desat episodes a few times this morning, sat between 85-89 when desat.  Peep turned back up to 8 peep per MD order.   RN aware.

## 2017-03-17 NOTE — Progress Notes (Signed)
Veronda Prude, pt's daughter, consented to TEE for her mom via telephone and witnessed by Judge Stall RN.

## 2017-03-17 NOTE — Progress Notes (Signed)
Progress Note  Patient Name: Jocelyn Hill. Kinoshita Date of Encounter: 03/17/2017  Primary Cardiologist: [facility] team   Subjective   66 yo with hx of severe MR Had bradycardia with respiratory failure yesterday Was transferred to CCU and intubated.  TEE today shows severe MR with reversal of flow in the PV .   Inpatient Medications    Scheduled Meds: . aspirin  81 mg Per Tube Daily  . chlorhexidine gluconate (MEDLINE KIT)  15 mL Mouth Rinse BID  . Chlorhexidine Gluconate Cloth  6 each Topical Daily  . docusate  100 mg Per Tube BID  . heparin subcutaneous  5,000 Units Subcutaneous Q8H  . insulin aspart  0-15 Units Subcutaneous Q4H  . ipratropium-albuterol  3 mL Nebulization TID  . levothyroxine  112 mcg Per Tube QAC breakfast  . mouth rinse  15 mL Mouth Rinse QID  . pantoprazole (PROTONIX) IV  40 mg Intravenous Daily  . sodium chloride flush  10-40 mL Intracatheter Q12H  . sodium chloride flush  3 mL Intravenous Q12H   Continuous Infusions: . sodium chloride 250 mL (03/17/17 0500)  . sodium chloride 20 mL/hr at 03/17/17 0800  . dexmedetomidine (PRECEDEX) IV infusion 0.2 mcg/kg/hr (03/17/17 0915)  . fentaNYL infusion INTRAVENOUS 50 mcg/hr (03/17/17 0915)  . norepinephrine (LEVOPHED) Adult infusion 1.013 mcg/min (03/17/17 0500)   PRN Meds: sodium chloride, acetaminophen **OR** acetaminophen, albuterol, bisacodyl, fentaNYL, fentaNYL (SUBLIMAZE) injection, hydrALAZINE, midazolam, midazolam, midazolam, sennosides, sodium chloride flush, sodium chloride flush   Vital Signs    Vitals:   03/17/17 1030 03/17/17 1045 03/17/17 1135 03/17/17 1147  BP: 125/81 127/75 (!) 435/81   Pulse: 63 62 61   Resp: 14 14 14    Temp:    98.4 F (36.9 C)  TempSrc:      SpO2: 90% 95% 92%   Weight:      Height:        Intake/Output Summary (Last 24 hours) at 03/17/17 1157 Last data filed at 03/17/17 0930  Gross per 24 hour  Intake          2764.48 ml  Output              802 ml  Net           1962.48 ml   Filed Weights   03/15/17 2027 03/16/17 0331 03/17/17 0403  Weight: 147 lb 14.4 oz (67.1 kg) 147 lb 11.3 oz (67 kg) 147 lb 14.9 oz (67.1 kg)    Telemetry    NSR  - Personally Reviewed  ECG     NSR  - Personally Reviewed  Physical Exam   GEN: sedated, intubated  Neck: No JVD Cardiac: 2-3 / 6 systolic murmur radiating to the axilla  Respiratory: vent  GI: Soft, nontender, non-distended  MS: No edema; No deformity. Neuro: sedated , on the vent  Psych: sedated   Labs    Chemistry Recent Labs Lab 03/15/17 0222 03/16/17 0758 03/16/17 1403 03/17/17 0324  NA 134* 129* 129* 132*  K 4.3 5.1 5.4* 4.3  CL 100* 96* 98* 101  CO2 22 19* 18* 21*  GLUCOSE 165* 185* 228* 99  BUN 37* 60* 63* 65*  CREATININE 3.53* 4.83* 5.12* 5.00*  CALCIUM 8.7* 8.4* 7.9* 8.2*  PROT 5.7*  --  5.4*  --   ALBUMIN 3.0* 3.0* 2.9* 3.2*  AST 18  --  395*  --   ALT 23  --  440*  --   ALKPHOS 83  --  81  --   BILITOT 0.9  --  1.4*  --   GFRNONAA 13* 9* 8* 8*  GFRAA 15* 10* 9* 10*  ANIONGAP 12 14 13 10      Hematology Recent Labs Lab 03/16/17 0758 03/16/17 1403 03/17/17 0324  WBC 11.5* 12.6* 13.6*  RBC 4.00 3.97 3.95  HGB 10.7* 10.2* 10.3*  HCT 30.3* 30.0* 29.2*  MCV 75.8* 75.6* 73.9*  MCH 26.8 25.7* 26.1  MCHC 35.3 34.0 35.3  RDW 14.0 13.5 13.3  PLT 270 258 287    Cardiac Enzymes Recent Labs Lab 03/14/17 1501 03/14/17 2026 03/15/17 0222 03/16/17 1403  TROPONINI 0.03* 0.03* <0.03 0.11*   No results for input(s): TROPIPOC in the last 168 hours.   BNP Recent Labs Lab 03/12/17 1400  BNP 2,230.5*     DDimer No results for input(s): DDIMER in the last 168 hours.   Radiology    Portable Chest Xray  Result Date: 03/17/2017 CLINICAL DATA:  Intubated, respiratory failure EXAM: PORTABLE CHEST 1 VIEW COMPARISON:  Chest radiograph from one day prior. FINDINGS: Endotracheal tube tip is 5.4 cm above the carina. Enteric tube enters stomach with the tip not seen on this  image. Right internal jugular central venous sheath terminates over the right upper mediastinum. Stable cardiomediastinal silhouette with mild cardiomegaly and aortic atherosclerosis. No pneumothorax. Small stable bilateral pleural effusions, right greater than left. Mild pulmonary edema, decreased. Bibasilar lung opacities are stable. IMPRESSION: 1. Well-positioned support structures.  No pneumothorax. 2. Mild congestive heart failure, improved. 3. Stable small bilateral pleural effusions, right greater than left with bibasilar lung opacities, favor atelectasis. Electronically Signed   By: Ilona Sorrel M.D.   On: 03/17/2017 07:55   Dg Chest Port 1 View  Result Date: 03/16/2017 CLINICAL DATA:  Central venous line placement. EXAM: PORTABLE CHEST 1 VIEW COMPARISON:  03/16/2017 at 1304 hours FINDINGS: New right internal jugular central venous introducer sheath has its tip projecting upper superior vena cava. No pneumothorax. Endotracheal tube and nasal/ orogastric tube are unchanged from prior exam, satisfactorily positioned. No change in the cardiomegaly or bilateral lung base opacities/pleural effusions. IMPRESSION: 1. New right internal jugular introducer sheath with its tip in the upper superior vena cava. No pneumothorax. No other change from the earlier study. Electronically Signed   By: Lajean Manes M.D.   On: 03/16/2017 15:16   Portable Chest Xray  Result Date: 03/16/2017 CLINICAL DATA:  66 year old female who coded today status post CPR EXAM: PORTABLE CHEST 1 VIEW COMPARISON:  0759 hours today and earlier. FINDINGS: Portable AP semi upright view at at 1304 hours. Intubated. Endotracheal tube tip in good position between the level the clavicles and carina. Enteric tube placed and courses to the abdomen. Side hole is at the level of the gastric body. Left and midline chest pacer or resuscitation pads in place. Stable cardiomegaly and mediastinal contours. Continued veiling opacity at both lung bases. No  pneumothorax. Stable pulmonary vascularity, no acute edema. No areas of worsening ventilation. No displaced rib fracture or acute osseous abnormality identified. IMPRESSION: 1. Endotracheal tube tip in good position. Enteric tube courses to the abdomen, side hole at the level of the stomach. 2. Stable ventilation from earlier today; bilateral pleural effusions with lower lobe collapse or consolidation. 3. Stable cardiomegaly and mediastinal contours. Electronically Signed   By: Genevie Ann M.D.   On: 03/16/2017 13:27   Dg Chest Port 1 View  Result Date: 03/16/2017 CLINICAL DATA:  Increased SOB, CP. Abdominal pain and constipation.  No BM in 48 hours. Hx of DM, HTN, stroke. Smoker. EXAM: PORTABLE CHEST 1 VIEW COMPARISON:  03/13/2017 FINDINGS: Stable cardiomegaly. Stable bilateral pleural effusions and bibasilar atelectasis. Stable pulmonary interstitial prominence, consistent with mild interstitial edema. No pneumothorax visualized. IMPRESSION: No significant change in cardiomegaly, bilateral pleural effusions, and probable mild pulmonary interstitial edema Electronically Signed   By: Earle Gell M.D.   On: 03/16/2017 08:16   Dg Abd Portable 1v  Result Date: 03/17/2017 CLINICAL DATA:  Orogastric tube placement EXAM: PORTABLE ABDOMEN - 1 VIEW COMPARISON:  March 16, 2017 FINDINGS: Orogastric tube tip and side port in stomach. There is a paucity of bowel gas. There is no bowel dilatation or air-fluid level to suggest obstruction. No free air. There are pleural effusions bilaterally with bibasilar consolidation. There is cardiomegaly. IMPRESSION: Orogastric tube tip and side port in stomach. Paucity of bowel gas may be seen normally but may also be indicative of enteritis or early ileus. Bowel obstruction not felt to be likely. No free air. Cardiomegaly. Question alveolar edema versus pneumonia in the bases. Electronically Signed   By: Lowella Grip III M.D.   On: 03/17/2017 11:02   Dg Abd Portable  1v  Result Date: 03/16/2017 CLINICAL DATA:  Abdominal pain.  Constipation. EXAM: PORTABLE ABDOMEN - 1 VIEW COMPARISON:  None. FINDINGS: The bowel gas pattern is normal with minimal air scattered throughout bowel. No excessive stool in the colon. No fecal impaction. Extensive calcification in the abdominal aorta and iliac arteries. No significant bone abnormality. IMPRESSION: Benign-appearing abdomen.  No excessive stool in the colon. Aortic atherosclerosis. Electronically Signed   By: Lorriane Shire M.D.   On: 03/16/2017 08:17    Cardiac Studies     Patient Profile     66 y.o. female with severe MR   Assessment & Plan    1.   Severe Mitral regurgitation: Has severe MR .    Will ultimately need a cath . TCTS has been notified  2. Respiratory failure. Remains on the vent .   She is much more stable today  Off pressers   3.  CKD - stage V -  Renal function has worsened with her respiratory arrest.  Further recs per nephrology .   For questions or updates, please contact [facility] Please consult [email or web address] for contact info under Cardiology/STEMI.      Signed, Mertie Moores, MD  03/17/2017, 11:57 AM

## 2017-03-17 NOTE — Progress Notes (Signed)
[facility] Progress Note  Subjective: HR and BP's have improved, got NS bolus and was getting pressors/ chronotropes yest but is now off and maintaining HR and BP.  TEE showed severe MR.  Nl LV fxn. Creat down slighty today, UOP still good. CVP's have been 6- 11 range.   Vitals:   03/17/17 1515 03/17/17 1530 03/17/17 1535 03/17/17 1545  BP: 136/86 (!) 141/91  (!) 141/87  Pulse: 64 62  62  Resp: 16 16  14   Temp:      TempSrc:      SpO2: 93% (!) 87% 92% 93%  Weight:      Height:        Inpatient medications: . aspirin  81 mg Per Tube Daily  . chlorhexidine gluconate (MEDLINE KIT)  15 mL Mouth Rinse BID  . Chlorhexidine Gluconate Cloth  6 each Topical Daily  . docusate  100 mg Per Tube BID  . heparin subcutaneous  5,000 Units Subcutaneous Q8H  . insulin aspart  0-15 Units Subcutaneous Q4H  . ipratropium-albuterol  3 mL Nebulization TID  . levothyroxine  112 mcg Per Tube QAC breakfast  . mouth rinse  15 mL Mouth Rinse QID  . pantoprazole (PROTONIX) IV  40 mg Intravenous Daily  . sodium chloride flush  10-40 mL Intracatheter Q12H  . sodium chloride flush  3 mL Intravenous Q12H   . sodium chloride 250 mL (03/17/17 0500)  . sodium chloride 20 mL/hr at 03/17/17 0800  . dexmedetomidine (PRECEDEX) IV infusion 0.1 mcg/kg/hr (03/17/17 1430)  . fentaNYL infusion INTRAVENOUS 75 mcg/hr (03/17/17 1000)  . norepinephrine (LEVOPHED) Adult infusion 1.013 mcg/min (03/17/17 0500)   sodium chloride, acetaminophen **OR** acetaminophen, albuterol, bisacodyl, fentaNYL, fentaNYL (SUBLIMAZE) injection, hydrALAZINE, midazolam, midazolam, midazolam, sennosides, sodium chloride flush, sodium chloride flush  Exam: On vent, awakens easily Chest clear ant/ lat RRR no RG, 3/6 sem Abd soft ntnd no ascites Ext no LE edema NF, follows commands  Home meds :   - norvasc/ hydralazine 100 tid/ labetalol 200 bid/ minoxidil 2.5 qd - albuterol/ asa/ lipitor/ T4 / singulair/ PPI / KCl  -  wellbutrin / cymbalta/ neurontin/ Lyrica / ambien prn / oxy prn   Creat 3.1- 3.5 range here this admission; creat at office visit w/ [facility] was 2.98 (per Dr Hollie Salk)    Impression: 1.  CKD stage IV - acute ^creat due to decompensation yesterday. Improving today.  Baseline creat ~ 3. CVP 7- 11 now. Stable.  2.  Resp failure - on vent now, per CCM 3.  Severe MR - per cards 4.  HTN - normotensive now, holding home bp meds (x 4) 5.  Hx CVA/ depression/ chronic pain  Plan - no new changes, f/u creat in am   Kelly Splinter MD [facility] pager [phone]   03/17/2017, 3:54 PM    Recent Labs Lab 03/16/17 0758 03/16/17 1403 03/17/17 0324  NA 129* 129* 132*  K 5.1 5.4* 4.3  CL 96* 98* 101  CO2 19* 18* 21*  GLUCOSE 185* 228* 99  BUN 60* 63* 65*  CREATININE 4.83* 5.12* 5.00*  CALCIUM 8.4* 7.9* 8.2*  PHOS 6.9* 7.4* 6.3*    Recent Labs Lab 03/15/17 0222 03/16/17 0758 03/16/17 1403 03/17/17 0324  AST 18  --  395*  --   ALT 23  --  440*  --   ALKPHOS 83  --  81  --   BILITOT 0.9  --  1.4*  --   PROT 5.7*  --  5.4*  --   ALBUMIN 3.0* 3.0* 2.9* 3.2*    Recent Labs Lab 03/16/17 0758 03/16/17 1403 03/17/17 0324  WBC 11.5* 12.6* 13.6*  HGB 10.7* 10.2* 10.3*  HCT 30.3* 30.0* 29.2*  MCV 75.8* 75.6* 73.9*  PLT 270 258 287   Iron/TIBC/Ferritin/ %Sat    Component Value Date/Time   IRON 19 (L) 03/14/2017 0320   TIBC 228 (L) 03/14/2017 0320   FERRITIN 41 03/14/2017 0320   IRONPCTSAT 8 (L) 03/14/2017 0320

## 2017-03-17 NOTE — Progress Notes (Addendum)
TCTS BRIEF CONSULT NOTE   Consult requested by Dr. Marlou Porch to evaluate patient with mitral regurgitation.  I have examined the patient (who currently remains sedated on mechanical ventilation) and personally reviewed the patient's history, radiographic studies, and both transthoracic and transesophageal echocardiograms. 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 Transthoracic and transesophageal echocardiograms demonstrate findings consistent with rheumatic mitral valve disease with normal left ventricular systolic function, severe mitral regurgitation, and severe biatrial enlargement. There are no findings to suggest the presence of acute onset mitral regurgitation, nor any indications for urgent surgical intervention. We will defer formal consultation until the patient has been successfully weaned from ventilatory support and can participate directly in her evaluation.  Drainage of one or both of her pleural effusions might be helpful, particularly on the right side. At some point she will need to undergo left and right heart catheterization and I suspect she will need dental service consultation as well.  We will follow along periodically but it please call and specific problems or questions arise during the interim period of time.  Rexene Alberts, MD 03/17/2017 5:34 PM

## 2017-03-17 NOTE — Progress Notes (Signed)
TEE procedure note Indication: MR Pt sedated on vent at time of procedure; she was given an additional 100 micrograms of fentanyl and 4 mg of versed IV. Omniplane probe passed without difficulty.  Normal LV systolic function Mild LAE; no LAA thrombus Normal RA and RV Thickened MV with restricted anterior and posterior leaflets; severe MR; flow reversal noted in pulmonary vein Mild to moderate TR Full report to follow. Kirk Ruths, MD

## 2017-03-18 ENCOUNTER — Inpatient Hospital Stay (HOSPITAL_COMMUNITY): Payer: Medicare Other

## 2017-03-18 DIAGNOSIS — I051 Rheumatic mitral insufficiency: Secondary | ICD-10-CM

## 2017-03-18 LAB — RENAL FUNCTION PANEL
Albumin: 3.1 g/dL — ABNORMAL LOW (ref 3.5–5.0)
Anion gap: 11 (ref 5–15)
BUN: 66 mg/dL — ABNORMAL HIGH (ref 6–20)
CO2: 23 mmol/L (ref 22–32)
Calcium: 8.6 mg/dL — ABNORMAL LOW (ref 8.9–10.3)
Chloride: 102 mmol/L (ref 101–111)
Creatinine, Ser: 4.47 mg/dL — ABNORMAL HIGH (ref 0.44–1.00)
GFR calc Af Amer: 11 mL/min — ABNORMAL LOW (ref >60)
GFR calc non Af Amer: 9 mL/min — ABNORMAL LOW (ref >60)
Glucose, Bld: 96 mg/dL (ref 65–99)
Phosphorus: 5.7 mg/dL — ABNORMAL HIGH (ref 2.5–4.6)
Potassium: 4.1 mmol/L (ref 3.5–5.1)
Sodium: 136 mmol/L (ref 135–145)

## 2017-03-18 LAB — POCT I-STAT 3, ART BLOOD GAS (G3+)
Acid-base deficit: 4 mmol/L — ABNORMAL HIGH (ref 0.0–2.0)
Bicarbonate: 21.8 mmol/L (ref 20.0–28.0)
O2 Saturation: 93 %
Patient temperature: 37
TCO2: 23 mmol/L (ref 22–32)
pCO2 arterial: 42.4 mmHg (ref 32.0–48.0)
pH, Arterial: 7.32 — ABNORMAL LOW (ref 7.350–7.450)
pO2, Arterial: 74 mmHg — ABNORMAL LOW (ref 83.0–108.0)

## 2017-03-18 LAB — GLUCOSE, CAPILLARY
Glucose-Capillary: 106 mg/dL — ABNORMAL HIGH (ref 65–99)
Glucose-Capillary: 123 mg/dL — ABNORMAL HIGH (ref 65–99)
Glucose-Capillary: 155 mg/dL — ABNORMAL HIGH (ref 65–99)
Glucose-Capillary: 93 mg/dL (ref 65–99)
Glucose-Capillary: 97 mg/dL (ref 65–99)
Glucose-Capillary: 97 mg/dL (ref 65–99)
Glucose-Capillary: 98 mg/dL (ref 65–99)

## 2017-03-18 LAB — CBC
HCT: 28.1 % — ABNORMAL LOW (ref 36.0–46.0)
Hemoglobin: 9.7 g/dL — ABNORMAL LOW (ref 12.0–15.0)
MCH: 25.8 pg — ABNORMAL LOW (ref 26.0–34.0)
MCHC: 34.5 g/dL (ref 30.0–36.0)
MCV: 74.7 fL — ABNORMAL LOW (ref 78.0–100.0)
Platelets: 226 10*3/uL (ref 150–400)
RBC: 3.76 MIL/uL — ABNORMAL LOW (ref 3.87–5.11)
RDW: 13.5 % (ref 11.5–15.5)
WBC: 10 10*3/uL (ref 4.0–10.5)

## 2017-03-18 LAB — MAGNESIUM: Magnesium: 2.3 mg/dL (ref 1.7–2.4)

## 2017-03-18 MED ORDER — AMLODIPINE BESYLATE 10 MG PO TABS
10.0000 mg | ORAL_TABLET | Freq: Every day | ORAL | Status: DC
  Administered 2017-03-19 – 2017-03-26 (×8): 10 mg via ORAL
  Filled 2017-03-18 (×9): qty 1

## 2017-03-18 MED ORDER — LABETALOL HCL 100 MG PO TABS
100.0000 mg | ORAL_TABLET | Freq: Two times a day (BID) | ORAL | Status: DC
  Administered 2017-03-18 – 2017-03-19 (×3): 100 mg via ORAL
  Filled 2017-03-18 (×3): qty 1

## 2017-03-18 MED ORDER — ONDANSETRON HCL 4 MG/2ML IJ SOLN
4.0000 mg | Freq: Four times a day (QID) | INTRAMUSCULAR | Status: DC
  Administered 2017-03-18: 4 mg via INTRAVENOUS
  Filled 2017-03-18: qty 2

## 2017-03-18 MED ORDER — AMLODIPINE BESYLATE 5 MG PO TABS
5.0000 mg | ORAL_TABLET | Freq: Every day | ORAL | Status: DC
  Administered 2017-03-18: 5 mg via ORAL
  Filled 2017-03-18: qty 1

## 2017-03-18 MED ORDER — HYDRALAZINE HCL 20 MG/ML IJ SOLN
10.0000 mg | INTRAMUSCULAR | Status: DC | PRN
  Administered 2017-03-18 (×2): 40 mg via INTRAVENOUS
  Administered 2017-03-19: 20 mg via INTRAVENOUS
  Administered 2017-03-19: 30 mg via INTRAVENOUS
  Administered 2017-03-20 – 2017-03-22 (×3): 20 mg via INTRAVENOUS
  Filled 2017-03-18 (×3): qty 1
  Filled 2017-03-18 (×2): qty 2
  Filled 2017-03-18: qty 1
  Filled 2017-03-18: qty 2

## 2017-03-18 MED ORDER — ONDANSETRON HCL 4 MG/2ML IJ SOLN
4.0000 mg | Freq: Four times a day (QID) | INTRAMUSCULAR | Status: DC | PRN
  Administered 2017-03-19 – 2017-04-03 (×10): 4 mg via INTRAVENOUS
  Filled 2017-03-18 (×10): qty 2

## 2017-03-18 MED FILL — Medication: Qty: 1 | Status: AC

## 2017-03-18 NOTE — Progress Notes (Addendum)
PCCM Interval Progress Note  Pt extubated earlier, tolerated well.  Transfer to SDU this afternoon.  Have asked TRH to assume care starting tomorrow AM 9/20.   Montey Hora, [place] Pager: [phone]  or [phone] 03/18/2017, 3:29 PM

## 2017-03-18 NOTE — Progress Notes (Signed)
[facility] Progress Note  Subjective: extubated now, CXR no change bibasilar infiltrates and R effusion. BP's are up.  CVP 9-12   Vitals:   03/18/17 1054 03/18/17 1100 03/18/17 1130 03/18/17 1238  BP:  (!) 177/102 (!) 185/93 (!) 200/96  Pulse: 80 81 75   Resp: _0 Temp:   98.4 F (36.9 C)   TempSrc:   Oral   SpO2: 92% 92% 95%   Weight:      Height:        Inpatient medications: . amLODipine  5 mg Oral Daily  . aspirin  81 mg Per Tube Daily  . chlorhexidine gluconate (MEDLINE KIT)  15 mL Mouth Rinse BID  . Chlorhexidine Gluconate Cloth  6 each Topical Daily  . docusate  100 mg Per Tube BID  . heparin subcutaneous  5,000 Units Subcutaneous Q8H  . insulin aspart  0-15 Units Subcutaneous Q4H  . ipratropium-albuterol  3 mL Nebulization TID  . labetalol  100 mg Oral BID  . levothyroxine  112 mcg Per Tube QAC breakfast  . mouth rinse  15 mL Mouth Rinse QID  . pantoprazole (PROTONIX) IV  40 mg Intravenous Daily  . sodium chloride flush  10-40 mL Intracatheter Q12H  . sodium chloride flush  3 mL Intravenous Q12H   . sodium chloride Stopped (03/17/17 2836)  . sodium chloride 10 mL/hr at 03/18/17 1000   sodium chloride, acetaminophen **OR** acetaminophen, albuterol, bisacodyl, hydrALAZINE, sennosides, sodium chloride flush, sodium chloride flush  Exam: On FM O2, responds ok Chest clear ant/ lat RRR no RG, 3/6 sem Abd soft ntnd no ascites Ext no LE edema NF, follows commands  Home meds :   - norvasc/ hydralazine 100 tid/ labetalol 200 bid/ minoxidil 2.5 qd - albuterol/ asa/ lipitor/ T4 / singulair/ PPI / KCl  - wellbutrin / cymbalta/ neurontin/ Lyrica / ambien prn / oxy prn   Creat 3.1- 3.5 range here this admission; creat at office visit w/ [facility] was 2.98 (per Dr Hollie Salk)    Impression: 1.  Acute on CKD 4 - creat down 4.4, baseline 3.0.  CVP stable 8-10. Will need heart cath pre valve surgery and may require dialysis this admission.  Will follow.    2.  Resp failure - improved, extubated 3.  Severe MR - per cards 4.  HTN - bp's up, some of BP meds restarted, plan titrate as needed 5.  Hx CVA/ depression/ chronic pain  Plan - as above   Kelly Splinter MD [facility] pager [phone]   03/18/2017, 12:39 PM    Recent Labs Lab 03/16/17 1403 03/17/17 0324 03/18/17 0346  NA 129* 132* 136  K 5.4* 4.3 4.1  CL 98* 101 102  CO2 18* 21* 23  GLUCOSE 228* 99 96  BUN 63* 65* 66*  CREATININE 5.12* 5.00* 4.47*  CALCIUM 7.9* 8.2* 8.6*  PHOS 7.4* 6.3* 5.7*    Recent Labs Lab 03/15/17 0222  03/16/17 1403 03/17/17 0324 03/18/17 0346  AST 18  --  395*  --   --   ALT 23  --  440*  --   --   ALKPHOS 83  --  81  --   --   BILITOT 0.9  --  1.4*  --   --   PROT 5.7*  --  5.4*  --   --   ALBUMIN 3.0*  < > 2.9* 3.2* 3.1*  < > = values in this interval not displayed.  Recent Labs Lab 03/16/17 1403 03/17/17 0324 03/18/17 0346  WBC 12.6* 13.6* 10.0  HGB 10.2* 10.3* 9.7*  HCT 30.0* 29.2* 28.1*  MCV 75.6* 73.9* 74.7*  PLT 258 287 226   Iron/TIBC/Ferritin/ %Sat    Component Value Date/Time   IRON 19 (L) 03/14/2017 0320   TIBC 228 (L) 03/14/2017 0320   FERRITIN 41 03/14/2017 0320   IRONPCTSAT 8 (L) 03/14/2017 0320

## 2017-03-18 NOTE — Progress Notes (Signed)
PCCM Interval Progress Note  Called by RN and informed of elevated BP despite 20mg  Hydralazine.  Home regimen started at 1/2 dose by cardiology earlier (amlodipine, labetalol).  Have increased hydralazine 10 - 40mg  q4hrs starting now and increased amlodipine to normal dose of 10mg  starting tomorrow morning.   Montey Hora, [place] [facility] Pager: [phone]  or [phone] 03/18/2017, 3:54 PM

## 2017-03-18 NOTE — Progress Notes (Signed)
Progress Note  Patient Name: Jocelyn Hill. Mcnear Date of Encounter: 03/18/2017  Primary Cardiologist: Bettina Gavia   Subjective   66 yo with hx of chronic severe MR S/p respiratory arrest Has stage IV-V CKD   Awake, alert BP is up   Inpatient Medications    Scheduled Meds: . aspirin  81 mg Per Tube Daily  . chlorhexidine gluconate (MEDLINE KIT)  15 mL Mouth Rinse BID  . Chlorhexidine Gluconate Cloth  6 each Topical Daily  . docusate  100 mg Per Tube BID  . heparin subcutaneous  5,000 Units Subcutaneous Q8H  . insulin aspart  0-15 Units Subcutaneous Q4H  . ipratropium-albuterol  3 mL Nebulization TID  . levothyroxine  112 mcg Per Tube QAC breakfast  . mouth rinse  15 mL Mouth Rinse QID  . pantoprazole (PROTONIX) IV  40 mg Intravenous Daily  . sodium chloride flush  10-40 mL Intracatheter Q12H  . sodium chloride flush  3 mL Intravenous Q12H   Continuous Infusions: . sodium chloride Stopped (03/17/17 8841)  . sodium chloride 20 mL/hr at 03/17/17 2000  . fentaNYL infusion INTRAVENOUS 25 mcg/hr (03/18/17 0736)  . norepinephrine (LEVOPHED) Adult infusion Stopped (03/17/17 2000)   PRN Meds: sodium chloride, acetaminophen **OR** acetaminophen, albuterol, bisacodyl, fentaNYL, fentaNYL (SUBLIMAZE) injection, hydrALAZINE, midazolam, midazolam, midazolam, sennosides, sodium chloride flush, sodium chloride flush   Vital Signs    Vitals:   03/18/17 0730 03/18/17 0800 03/18/17 0823 03/18/17 0900  BP: (!) 148/92 (!) 172/95  (!) 185/89  Pulse: 68 76  77  Resp: 16 16  16   Temp:      TempSrc:      SpO2: 99% 98% 93% 96%  Weight:      Height:        Intake/Output Summary (Last 24 hours) at 03/18/17 0925 Last data filed at 03/18/17 0900  Gross per 24 hour  Intake           748.23 ml  Output             1300 ml  Net          -551.77 ml   Filed Weights   03/16/17 0331 03/17/17 0403 03/18/17 0402  Weight: 147 lb 11.3 oz (67 kg) 147 lb 14.9 oz (67.1 kg) 150 lb 2.1 oz (68.1 kg)     Telemetry    NSR  - Personally Reviewed  ECG     NSR  - Personally Reviewed  Physical Exam   GEN: No acute distress.  Chronically ill female  Neck: No JVD Cardiac: RR, 3/6 systolic murmur , radiating to the axilla  Respiratory:  on the vent . GI: Soft, nontender, non-distended  MS: No edema; No deformity. Neuro:  Nonfocal  Psych: Normal affect   Labs    Chemistry Recent Labs Lab 03/15/17 0222  03/16/17 1403 03/17/17 0324 03/18/17 0346  NA 134*  < > 129* 132* 136  K 4.3  < > 5.4* 4.3 4.1  CL 100*  < > 98* 101 102  CO2 22  < > 18* 21* 23  GLUCOSE 165*  < > 228* 99 96  BUN 37*  < > 63* 65* 66*  CREATININE 3.53*  < > 5.12* 5.00* 4.47*  CALCIUM 8.7*  < > 7.9* 8.2* 8.6*  PROT 5.7*  --  5.4*  --   --   ALBUMIN 3.0*  < > 2.9* 3.2* 3.1*  AST 18  --  395*  --   --   ALT  23  --  440*  --   --   ALKPHOS 83  --  81  --   --   BILITOT 0.9  --  1.4*  --   --   GFRNONAA 13*  < > 8* 8* 9*  GFRAA 15*  < > 9* 10* 11*  ANIONGAP 12  < > 13 10 11   < > = values in this interval not displayed.   Hematology Recent Labs Lab 03/16/17 1403 03/17/17 0324 03/18/17 0346  WBC 12.6* 13.6* 10.0  RBC 3.97 3.95 3.76*  HGB 10.2* 10.3* 9.7*  HCT 30.0* 29.2* 28.1*  MCV 75.6* 73.9* 74.7*  MCH 25.7* 26.1 25.8*  MCHC 34.0 35.3 34.5  RDW 13.5 13.3 13.5  PLT 258 287 226    Cardiac Enzymes Recent Labs Lab 03/14/17 1501 03/14/17 2026 03/15/17 0222 03/16/17 1403  TROPONINI 0.03* 0.03* <0.03 0.11*   No results for input(s): TROPIPOC in the last 168 hours.   BNP Recent Labs Lab 03/12/17 1400  BNP 2,230.5*     DDimer No results for input(s): DDIMER in the last 168 hours.   Radiology    Dg Chest Port 1 View  Result Date: 03/18/2017 CLINICAL DATA:  Respiratory failure EXAM: PORTABLE CHEST 1 VIEW COMPARISON:  03/17/2017 FINDINGS: Endotracheal tube and NG tube are unchanged. Cardiomegaly with vascular congestion. Layering right effusion with right lower lobe atelectasis or  infiltrate and left retrocardiac opacity, unchanged. Possible small left effusion. IMPRESSION: Continued bilateral effusions, right greater the left with bibasilar atelectasis or infiltrates. Cardiomegaly, vascular congestion. Electronically Signed   By: Rolm Baptise M.D.   On: 03/18/2017 08:52   Portable Chest Xray  Result Date: 03/17/2017 CLINICAL DATA:  Intubated, respiratory failure EXAM: PORTABLE CHEST 1 VIEW COMPARISON:  Chest radiograph from one day prior. FINDINGS: Endotracheal tube tip is 5.4 cm above the carina. Enteric tube enters stomach with the tip not seen on this image. Right internal jugular central venous sheath terminates over the right upper mediastinum. Stable cardiomediastinal silhouette with mild cardiomegaly and aortic atherosclerosis. No pneumothorax. Small stable bilateral pleural effusions, right greater than left. Mild pulmonary edema, decreased. Bibasilar lung opacities are stable. IMPRESSION: 1. Well-positioned support structures.  No pneumothorax. 2. Mild congestive heart failure, improved. 3. Stable small bilateral pleural effusions, right greater than left with bibasilar lung opacities, favor atelectasis. Electronically Signed   By: Ilona Sorrel M.D.   On: 03/17/2017 07:55   Dg Chest Port 1 View  Result Date: 03/16/2017 CLINICAL DATA:  Central venous line placement. EXAM: PORTABLE CHEST 1 VIEW COMPARISON:  03/16/2017 at 1304 hours FINDINGS: New right internal jugular central venous introducer sheath has its tip projecting upper superior vena cava. No pneumothorax. Endotracheal tube and nasal/ orogastric tube are unchanged from prior exam, satisfactorily positioned. No change in the cardiomegaly or bilateral lung base opacities/pleural effusions. IMPRESSION: 1. New right internal jugular introducer sheath with its tip in the upper superior vena cava. No pneumothorax. No other change from the earlier study. Electronically Signed   By: Lajean Manes M.D.   On: 03/16/2017 15:16     Portable Chest Xray  Result Date: 03/16/2017 CLINICAL DATA:  67 year old female who coded today status post CPR EXAM: PORTABLE CHEST 1 VIEW COMPARISON:  0759 hours today and earlier. FINDINGS: Portable AP semi upright view at at 1304 hours. Intubated. Endotracheal tube tip in good position between the level the clavicles and carina. Enteric tube placed and courses to the abdomen. Side hole is at the level  of the gastric body. Left and midline chest pacer or resuscitation pads in place. Stable cardiomegaly and mediastinal contours. Continued veiling opacity at both lung bases. No pneumothorax. Stable pulmonary vascularity, no acute edema. No areas of worsening ventilation. No displaced rib fracture or acute osseous abnormality identified. IMPRESSION: 1. Endotracheal tube tip in good position. Enteric tube courses to the abdomen, side hole at the level of the stomach. 2. Stable ventilation from earlier today; bilateral pleural effusions with lower lobe collapse or consolidation. 3. Stable cardiomegaly and mediastinal contours. Electronically Signed   By: Genevie Ann M.D.   On: 03/16/2017 13:27   Dg Abd Portable 1v  Result Date: 03/17/2017 CLINICAL DATA:  Orogastric tube placement EXAM: PORTABLE ABDOMEN - 1 VIEW COMPARISON:  March 16, 2017 FINDINGS: Orogastric tube tip and side port in stomach. There is a paucity of bowel gas. There is no bowel dilatation or air-fluid level to suggest obstruction. No free air. There are pleural effusions bilaterally with bibasilar consolidation. There is cardiomegaly. IMPRESSION: Orogastric tube tip and side port in stomach. Paucity of bowel gas may be seen normally but may also be indicative of enteritis or early ileus. Bowel obstruction not felt to be likely. No free air. Cardiomegaly. Question alveolar edema versus pneumonia in the bases. Electronically Signed   By: Lowella Grip III M.D.   On: 03/17/2017 11:02    Cardiac Studies      Patient Profile     66  y.o. female with chronic severe MR   Assessment & Plan    1.   Mitral regurgitation:   Chronic and severe . I have talked with PCCM Pershing Proud, MD)  I agree that she is euvolumic in general  But has pulmonary edema secondary to her severe MR Will restart her BP meds ( at a lower dose initially )  Hope for extubation today   Will need a cath and dental consult prior to MVR.  2.  HTN - essential .  Restart meds 1/2 dose amlodipine and 1/2 dose labetolol to start . Titrate up as tolerated / needed.   3.  CKD - plans per nephrology ,  Will likely need dialysis at some point   For questions or updates, please contact [place] Please consult [email or web address] for contact info under Cardiology/STEMI.      Signed, Mertie Moores, MD  03/18/2017, 9:25 AM

## 2017-03-18 NOTE — Progress Notes (Signed)
San Jetty RN and Allena Katz RN wasted ~75cc of fentanyl drop down the sink.

## 2017-03-18 NOTE — Progress Notes (Signed)
PULMONARY / CRITICAL CARE MEDICINE   Name: Jocelyn Hill. Colello MRN: [id number] DOB: [birth year]    ADMISSION DATE:  03/12/2017 CONSULTATION DATE:  03/16/17  REFERRING MD:  Wynetta Emery  CHIEF COMPLAINT:  SOB  HISTORY OF PRESENT ILLNESS:  Pt is encephelopathic; therefore, this HPI is obtained from chart review. Jocelyn Hill. Leisner is a 66 y.o. female with PMH as outlined below. She was admitted 03/12/17 with SOB. Symptoms that started the Saturday prior, 9/8 and had progressed to the point where she cannot take it any longer so went to [facility] for further evaluation. CXR demonstrated bilateral pleural effusions and BNP was noted to be elevated at 1700. She was also placed on BiPAP due to increased work of breathing. Due to worsening renal function and new diagnosis of CHF, she was subsequently transferred to [facility] for further evaluation and management.  Upon arrival to [facility], she was evaluated by cardiology and had her Lasix increased. Cardiology had also planned for TEE once patient was further stabilized. She was later also evaluated by nephrology who also recommended continuing with diuresis.  On afternoon of 9/17, patient developed  bradycardia (heart rates in the 50s and 60s), hypotension (SBP 60s and 70s) and altered mental status.  Patient was subsequently intubated for airway protection by anesthesia and was then transferred to the ICU for further evaluation and management.  CXR from a.m. 9/17 had minimal change from prior (cardiomegaly, bilateral pleural effusions, mild pulmonary edema).  Echo from 9/13 showed EF of 60-65%,  G2DD, PAP 60, small to moderate pericardial effusion.  ABG just prior to intubation and mild hypoxemia (7.34 / 32 / 48).  After arrival to ICU, EKG c/w significant sinus bradycardia.  Started on dopamine + levophed.   SUBJECTIVE:  CVP 12.  Had TEE 9/18 > thickened MV with restricted anterior and posterior leaflets, severe MR, mild to mod TR. Seen by TCTS > no  indication for emergent surgical intervention.  Will formally see once pt extubated.  VITAL SIGNS: BP (!) 156/90   Pulse 66   Temp 97.7 F (36.5 C) (Core (Comment))   Resp 15   Ht 5\' 9"  (1.753 m)   Wt 68.1 kg (150 lb 2.1 oz)   SpO2 99%   BMI 22.17 kg/m   HEMODYNAMICS: CVP:  [8 mmHg-12 mmHg] 12 mmHg  VENTILATOR SETTINGS: Vent Mode: PRVC FiO2 (%):  [70 %] 70 % Set Rate:  [14 bmp] 14 bmp Vt Set:  [530 mL] 530 mL PEEP:  [6 cmH20-8 cmH20] 8 cmH20 Plateau Pressure:  [14 cmH20-25 cmH20] 23 cmH20  INTAKE / OUTPUT: I/O last 3 completed shifts: In: 3172.4 [I.V.:3172.4] Out: 1702 [Urine:1702]  PHYSICAL EXAMINATION: General: Adult female, in NAD. Neuro:  Awake, following commands, no deficits. HEENT: [place]/AT. PERRL, sclerae anicteric. Cardiovascular: RRR, 3/6 SEM. Lungs: Respirations even and unlabored.  CTA bilaterally, No W/R/R. Abdomen: BS x 4, soft, NT/ND.  Musculoskeletal: No gross deformities, no edema.  Skin: Intact, warm, no rashes.  LABS:  BMET  Recent Labs Lab 03/16/17 1403 03/17/17 0324 03/18/17 0346  NA 129* 132* 136  K 5.4* 4.3 4.1  CL 98* 101 102  CO2 18* 21* 23  BUN 63* 65* 66*  CREATININE 5.12* 5.00* 4.47*  GLUCOSE 228* 99 96    Electrolytes  Recent Labs Lab 03/16/17 1403 03/17/17 0324 03/18/17 0346  CALCIUM 7.9* 8.2* 8.6*  MG 1.9 2.4 2.3  PHOS 7.4* 6.3* 5.7*    CBC  Recent Labs Lab 03/16/17 1403 03/17/17 0324  03/18/17 0346  WBC 12.6* 13.6* 10.0  HGB 10.2* 10.3* 9.7*  HCT 30.0* 29.2* 28.1*  PLT 258 287 226   Coag's  Recent Labs Lab 03/17/17 0439  INR 1.53    Sepsis Markers  Recent Labs Lab 03/16/17 1403  PROCALCITON 0.26    ABG  Recent Labs Lab 03/16/17 1554 03/16/17 2013 03/17/17 0821  PHART 7.386 7.398 7.484*  PCO2ART 34.4 34.3 28.5*  PO2ART 67.0* 109.0* 70.0*   Liver Enzymes  Recent Labs Lab 03/15/17 0222  03/16/17 1403 03/17/17 0324 03/18/17 0346  AST 18  --  395*  --   --   ALT 23  --  440*   --   --   ALKPHOS 83  --  81  --   --   BILITOT 0.9  --  1.4*  --   --   ALBUMIN 3.0*  < > 2.9* 3.2* 3.1*  < > = values in this interval not displayed. Cardiac Enzymes  Recent Labs Lab 03/14/17 2026 03/15/17 0222 03/16/17 1403  TROPONINI 0.03* <0.03 0.11*   Glucose  Recent Labs Lab 03/17/17 0351 03/17/17 0848 03/17/17 1146 03/17/17 1610 03/17/17 2355 03/18/17 0342  GLUCAP 99 99 98 124* 97 98   Imaging Dg Abd Portable 1v  Result Date: 03/17/2017 CLINICAL DATA:  Orogastric tube placement EXAM: PORTABLE ABDOMEN - 1 VIEW COMPARISON:  March 16, 2017 FINDINGS: Orogastric tube tip and side port in stomach. There is a paucity of bowel gas. There is no bowel dilatation or air-fluid level to suggest obstruction. No free air. There are pleural effusions bilaterally with bibasilar consolidation. There is cardiomegaly. IMPRESSION: Orogastric tube tip and side port in stomach. Paucity of bowel gas may be seen normally but may also be indicative of enteritis or early ileus. Bowel obstruction not felt to be likely. No free air. Cardiomegaly. Question alveolar edema versus pneumonia in the bases. Electronically Signed   By: Lowella Grip III M.D.   On: 03/17/2017 11:02    STUDIES:  TTE 9/13 > LVEF 60-65%, no RWMA, G2DD, severe MR, mildly dilated LA and RA, mild to mod TR, PAP 60 mm Hg. TEE 9/18 > EF 55-60%, severe restriction of anterior and posterior leaflet of MV with severe regurgitation.  Mild to mod TR. CXR 03/16/17 >> no sign change in cardiomegaly, bilateral pleural effusions, and probable mild pulmonary interstitial edema  CULTURES: 9/16 MRSA PCR >> neg  ANTIBIOTICS: 9/17 Azithromycin >> 9/17  SIGNIFICANT EVENTS: 9/13 Admit 9/17 Brady/hypotensive/ intubated > transfer to ICU  LINES/TUBES: 9/17 ETT  DISCUSSION: 66 y.o. F admitted 9/13 with SOB and new dCHF and severe MR.  Was being diuresed then PM 9/17 developedbradycardia with CHB, hypotension, AMS with inability  to protect the airway.  Subsequently required intubation and was then transferred to ICU.  After arrival to ICU, EKG c/w significant sinus bradycardia.  Started on dopamine + levophed.  ASSESSMENT / PLAN:  PULMONARY A: Acute respiratory insufficiency - in the setting of acute bradycardia/hypotension Concern for COPD exacerbation - not convinced at this point as appears to be more cardiogenic Atelectasis R > L P:   PS wean this AM for hopeful extubation Continue BD's Hold Singular for now Prednisone d/c'd 9/17 Follow CXR  CARDIOVASCULAR A:  Bradycardia with hypotension - EKG now with significant sinus brady.  Resolved AM 9/18 and off pressors. Severe MR, mild-mod TR Acute diastolic HF - W1UX, EF 32-35% HTN, HLD P:  Cards following CVTS following peripherally - will formally see once  pt extubated Holding diuresis for now given hypotension/ elevated sCr Hydralazine PRN, goal SBP < 170 Goal CVP 8 - 10 Holding pressors as remains normo to hypertensive this AM  RENAL A:   Hyponatremia - Resolved AoCKD- r/t hypotension.  Improving Hx CKD stage 3 - net -2L P:   Nephrology following  Trend BMP / mag/phos/ urinary output Replace electrolytes as indicated Daily weights  GASTROINTESTINAL A:   GI prophylaxis Nutrition P:   PPI for SUP NPO Bowel regmen Hold miralax  HEMATOLOGIC A:   Chronic anemia P:  Trend CBC Hold ferrous sulfate Heparin for VTE ppx  INFECTIOUS A:   No obvious source of infection - PCT reassuring P:   Trend WBC/ fever curve  ENDOCRINE A:   DMT2 Hypothyroid  P:   SSI Continue synthroid per tube  NEUROLOGIC A:   Acute encephalopathy - related to hypoperfusion/bradycardia Hx CVA, Depression, Chronic pain P:   RASS goal: 0/-1 Fentanyl gtt / Midazolam PRN - wean to off this AM to facilitate WUA and SBT Daily WUA Hold ambien, ultram, zanaflex, lyrica  FAMILY  - Updates: Daughter updated by Dr. Marlou Porch 9/17.  -  Inter-disciplinary family meet or Palliative Care meeting due by:  03/23/17  CCT: 30 min.   Montey Hora, [place] [facility] Pager: [phone]  or [phone] 03/18/2017, 7:28 AM   Attending Addendum: I personally examined this patient and agree with plan as detailed above. Remains off pressors this AM. Weaned FIO2 from 70% to 50% and PEEP from 8 to 5. Tolerating this well. Performed a 1.5hr SBT this AM with good RSBI, great cuff leak, good cough, AAOx3 and obeying commands, scant ETT secretions. ABG on the SBT was acceptable, mild metabolic acidosis. Her AKI-on-CKD is slightly improved today. Feel she is total body intravascularly hypovolemic to euvolemic despite the pulmonary edema which is still present on CXR today. Feel the pulmonary edema is due to her severe MR. CT Surgery consulted on patient but would like her to be extubated to participate in the discussion prior to deciding on surgery. She will also need a cardiac cath prior to surgery. This (receiving contrast) may unfortunately push her into needing dialysis. She does not need it now though. Appreciate Renal following. Will attempt extubation to hiflo today.   40 minutes critical care time  Vernie Murders, MD Pulmonary & Critical Care

## 2017-03-18 NOTE — Procedures (Signed)
2Extubation Procedure Note  Patient Details:   Name: Jocelyn Hill. Leiter DOB: Jan 24, 1951 MRN: [id number]   Airway Documentation:     Evaluation2  O2 sats: transiently fell during during procedure Complications: Complications of RA30 low and had to transition to NRB Patient did tolerate procedure well. Bilateral Breath Sounds: Clear, Diminished   Yes  Extubated to 6l/min [place] in light of io2 on ventilator at 50%. sp02 decreased to 83%, then placed on VM at 55%, spo2 86%, and now a NRB 100% spo2 98%. Continue to monitor and wean fio2 as tolerated.  Revonda Standard 03/18/2017, 10:21 AM

## 2017-03-19 ENCOUNTER — Other Ambulatory Visit: Payer: Self-pay

## 2017-03-19 DIAGNOSIS — R001 Bradycardia, unspecified: Secondary | ICD-10-CM

## 2017-03-19 DIAGNOSIS — F1910 Other psychoactive substance abuse, uncomplicated: Secondary | ICD-10-CM

## 2017-03-19 DIAGNOSIS — D649 Anemia, unspecified: Secondary | ICD-10-CM

## 2017-03-19 DIAGNOSIS — I469 Cardiac arrest, cause unspecified: Secondary | ICD-10-CM

## 2017-03-19 DIAGNOSIS — E118 Type 2 diabetes mellitus with unspecified complications: Secondary | ICD-10-CM

## 2017-03-19 DIAGNOSIS — I272 Pulmonary hypertension, unspecified: Secondary | ICD-10-CM

## 2017-03-19 DIAGNOSIS — J441 Chronic obstructive pulmonary disease with (acute) exacerbation: Secondary | ICD-10-CM

## 2017-03-19 LAB — RENAL FUNCTION PANEL
Albumin: 3 g/dL — ABNORMAL LOW (ref 3.5–5.0)
Anion gap: 10 (ref 5–15)
BUN: 56 mg/dL — ABNORMAL HIGH (ref 6–20)
CO2: 21 mmol/L — ABNORMAL LOW (ref 22–32)
Calcium: 8.4 mg/dL — ABNORMAL LOW (ref 8.9–10.3)
Chloride: 108 mmol/L (ref 101–111)
Creatinine, Ser: 3.51 mg/dL — ABNORMAL HIGH (ref 0.44–1.00)
GFR calc Af Amer: 15 mL/min — ABNORMAL LOW (ref >60)
GFR calc non Af Amer: 13 mL/min — ABNORMAL LOW (ref >60)
Glucose, Bld: 98 mg/dL (ref 65–99)
Phosphorus: 5.6 mg/dL — ABNORMAL HIGH (ref 2.5–4.6)
Potassium: 3.9 mmol/L (ref 3.5–5.1)
Sodium: 139 mmol/L (ref 135–145)

## 2017-03-19 LAB — CBC
HCT: 27.5 % — ABNORMAL LOW (ref 36.0–46.0)
Hemoglobin: 9.4 g/dL — ABNORMAL LOW (ref 12.0–15.0)
MCH: 25.8 pg — ABNORMAL LOW (ref 26.0–34.0)
MCHC: 34.2 g/dL (ref 30.0–36.0)
MCV: 75.5 fL — ABNORMAL LOW (ref 78.0–100.0)
Platelets: 229 10*3/uL (ref 150–400)
RBC: 3.64 MIL/uL — ABNORMAL LOW (ref 3.87–5.11)
RDW: 13.6 % (ref 11.5–15.5)
WBC: 9.2 10*3/uL (ref 4.0–10.5)

## 2017-03-19 LAB — RAPID URINE DRUG SCREEN, HOSP PERFORMED
Amphetamines: NOT DETECTED
Barbiturates: NOT DETECTED
Benzodiazepines: POSITIVE — AB
Cocaine: NOT DETECTED
Opiates: POSITIVE — AB
Tetrahydrocannabinol: POSITIVE — AB

## 2017-03-19 LAB — BASIC METABOLIC PANEL
Anion gap: 8 (ref 5–15)
BUN: 54 mg/dL — ABNORMAL HIGH (ref 6–20)
CO2: 21 mmol/L — ABNORMAL LOW (ref 22–32)
Calcium: 8.8 mg/dL — ABNORMAL LOW (ref 8.9–10.3)
Chloride: 107 mmol/L (ref 101–111)
Creatinine, Ser: 3.57 mg/dL — ABNORMAL HIGH (ref 0.44–1.00)
GFR calc Af Amer: 14 mL/min — ABNORMAL LOW (ref >60)
GFR calc non Af Amer: 12 mL/min — ABNORMAL LOW (ref >60)
Glucose, Bld: 130 mg/dL — ABNORMAL HIGH (ref 65–99)
Potassium: 3.6 mmol/L (ref 3.5–5.1)
Sodium: 136 mmol/L (ref 135–145)

## 2017-03-19 LAB — MAGNESIUM
Magnesium: 2.1 mg/dL (ref 1.7–2.4)
Magnesium: 2.1 mg/dL (ref 1.7–2.4)

## 2017-03-19 LAB — GLUCOSE, CAPILLARY
Glucose-Capillary: 116 mg/dL — ABNORMAL HIGH (ref 65–99)
Glucose-Capillary: 127 mg/dL — ABNORMAL HIGH (ref 65–99)
Glucose-Capillary: 132 mg/dL — ABNORMAL HIGH (ref 65–99)
Glucose-Capillary: 133 mg/dL — ABNORMAL HIGH (ref 65–99)
Glucose-Capillary: 181 mg/dL — ABNORMAL HIGH (ref 65–99)
Glucose-Capillary: 93 mg/dL (ref 65–99)

## 2017-03-19 LAB — TROPONIN I
Troponin I: 0.1 ng/mL (ref <0.03)
Troponin I: 0.11 ng/mL (ref <0.03)

## 2017-03-19 MED ORDER — NITROGLYCERIN 0.4 MG SL SUBL
0.4000 mg | SUBLINGUAL_TABLET | SUBLINGUAL | Status: DC | PRN
  Administered 2017-03-19 (×3): 0.4 mg via SUBLINGUAL
  Filled 2017-03-19 (×4): qty 1

## 2017-03-19 MED ORDER — ATROPINE SULFATE 1 MG/10ML IJ SOSY
PREFILLED_SYRINGE | INTRAMUSCULAR | Status: AC
  Administered 2017-03-19: 1 mg
  Filled 2017-03-19: qty 20

## 2017-03-19 MED ORDER — NITROGLYCERIN IN D5W 200-5 MCG/ML-% IV SOLN
0.0000 ug/min | INTRAVENOUS | Status: DC
  Administered 2017-03-19: 5 ug/min via INTRAVENOUS
  Filled 2017-03-19: qty 250

## 2017-03-19 MED ORDER — HYDRALAZINE HCL 20 MG/ML IJ SOLN
15.0000 mg | Freq: Four times a day (QID) | INTRAMUSCULAR | Status: DC
  Administered 2017-03-19 – 2017-03-22 (×13): 15 mg via INTRAVENOUS
  Administered 2017-03-23: 0.75 mg via INTRAVENOUS
  Administered 2017-03-23: 15 mg via INTRAVENOUS
  Filled 2017-03-19 (×15): qty 1

## 2017-03-19 MED ORDER — LEVALBUTEROL HCL 1.25 MG/0.5ML IN NEBU: 1.2500 mg | INHALATION_SOLUTION | Freq: Three times a day (TID) | RESPIRATORY_TRACT | Status: DC

## 2017-03-19 MED ORDER — ATROPINE SULFATE 1 MG/10ML IJ SOSY
PREFILLED_SYRINGE | INTRAMUSCULAR | Status: AC
  Filled 2017-03-19: qty 20

## 2017-03-19 MED ORDER — LABETALOL HCL 100 MG PO TABS: 100.0000 mg | ORAL_TABLET | Freq: Two times a day (BID) | ORAL | Status: DC

## 2017-03-19 MED ORDER — MORPHINE SULFATE (PF) 4 MG/ML IV SOLN
2.0000 mg | Freq: Once | INTRAVENOUS | Status: AC
  Administered 2017-03-19: 2 mg via INTRAVENOUS
  Filled 2017-03-19: qty 1

## 2017-03-19 MED ORDER — ATROPINE SULFATE 1 MG/10ML IJ SOSY
PREFILLED_SYRINGE | INTRAMUSCULAR | Status: AC
  Administered 2017-03-19: 1 mg
  Filled 2017-03-19: qty 10

## 2017-03-19 MED ORDER — LABETALOL HCL 200 MG PO TABS: 200.0000 mg | ORAL_TABLET | Freq: Two times a day (BID) | ORAL | Status: DC

## 2017-03-19 MED ORDER — LABETALOL HCL 5 MG/ML IV SOLN
10.0000 mg | Freq: Four times a day (QID) | INTRAVENOUS | Status: DC
  Administered 2017-03-19 (×2): 10 mg via INTRAVENOUS
  Filled 2017-03-19 (×2): qty 4

## 2017-03-19 MED ORDER — LEVALBUTEROL HCL 1.25 MG/0.5ML IN NEBU
1.2500 mg | INHALATION_SOLUTION | Freq: Three times a day (TID) | RESPIRATORY_TRACT | Status: DC
  Administered 2017-03-19 – 2017-03-23 (×12): 1.25 mg via RESPIRATORY_TRACT
  Filled 2017-03-19 (×15): qty 0.5

## 2017-03-19 NOTE — Progress Notes (Signed)
Called to see patient for bradycardia Received IV labetalol at 1447 Pt had atrial fibrillation develop with RVR Pt received another 10 mg IV labetalol after 1800 Soon after that developed pauses   Longest 7 seconds BP OK throughout Pt received 3 amps atropine total  Now HR 70s with SR and occasional PVCs  Complains of some chest tightness  On exam:  Lungs with some rhonchi  Chest  Mild tenderness over L chest to palpation Cardiac exam:: RRR with occasional skip  II/VI systolic murmur at apex  Ext without edema  2+ pulses  Recomm  Would hold b blocker   No indication for pacer at this time Follow HR   If reverts to atrial fib may need to consider careful use of IV amio (no boluse) though may precipitate afib No great recomm with this in setting also of lung dz  If bradycardic again, can use atropine 0.5 prn until labetalol washes out  D/C po labetalol   Did not receive coreg Optimize pulmonary rx   Avoid bronchodilators though  No changes in BP meds  Running a little high but better with bradycardia.  Dorris Carnes

## 2017-03-19 NOTE — Progress Notes (Signed)
Dr. Sherral Hammers paged regarding pt's c/o nausea and dry heaving. Pt already had zofran and is not due again until 1100. Awaiting call back to see if MD would like to order something else for nausea.

## 2017-03-19 NOTE — Progress Notes (Signed)
Dr. Acie Fredrickson paged to update him on patient's c/o chest pain, troponin 0.10, ekg performed and meds given. Awaiting call back.

## 2017-03-19 NOTE — Progress Notes (Signed)
PROGRESS NOTE    Jocelyn Hill  MQK:[id number] DOB: [birth year] DOA: 03/12/2017 PCP: Jocelyn Rings, MD   Brief Narrative:  66 y.o. BF PMHx Anxiety, Depression, CVA, poorly controlled HTN, CKD stage III, DM type II with Peripheral Neuropathy, Hypothyroidism   Who states that over the past month, she is noted she has become more winded. Then starting on Saturday, 9/8, patient cut the point where she felt like she is having a lot of trouble breathing. The symptoms continued to progress to the point where she could not take it anymore and she came in to the emergency room at [facility] for further evaluation. Patient found to have a BNP of 1700 with a new diagnosis of CHF. Chest x-ray noted moderate bilateral pleural effusions. Patient was given Lasix and put on nitroglycerin patch. Blood pressure systolic in the [id number]. Patient initially BiPAP to keep oxygen saturations greater than 90%. In addition, creatinine noted to be 3.2 or baseline was 2.1. Because of worsening creatinine and decompensated CHF, it was felt best to transfer this patient to [facility]. Hospitalists accepted transfer.    Subjective: 9/20  positive CP substernal w/ radiation to Left shoulder, positive SOB, positive N/V.    Assessment & Plan:   Principal Problem:   Acute on chronic respiratory failure with hypoxia (HCC) Active Problems:   Hypertension   Hypothyroidism   Diet-controlled diabetes mellitus (HCC)   Acute diastolic (congestive) heart failure (HCC)   AKI (acute kidney injury) (HCC)   CKD (chronic kidney disease), stage III   CHF (congestive heart failure) (HCC)   Dysphagia   Malignant hypertension   Microcytic anemia   Respiratory failure with Hypoxia/COPD exacerbation? -Resolved patient on room air but complaining of SOB secondary to CP -Patient with good air movement and negative wheezing.believe COPD exacerbation. -Xopenex TID. Discontinue albuterol and DuoNeb secondary to tachycardia  and hypertension -   Substance abuse -UDS positive for benzodiazepine, opiates, marijuana. Unable to differentiate if positive prior to hospitalization for Benzodiazepine/Opiates.  Acute Diastolic CHF/severe MR -Strict in and out -Daily weight -Change PO labetalol--> Labetalol 10 mg QID -Hydralazine 15 mg QID -Hydralazine PRN -Monitor closely for bradycardia: Per EMR patient had bradycardia with hypotension that resolved on 9/18 (that required pressors) -Per cardiology requires Dental consult prior to MV replacement. Unfortunately adult dental services unavailable this week. NCM Jocelyn Hill working on seeing a. If dental services available next week. B. No dental services available next week how to transport patient into town to have teeth evaluated/removed. -Cardiology to perform cardiac catheterization. -ADDENDUM #1: Received call from RN Jocelyn Hill following SL NTG + Morphine chest pain has decreased but not totally resolved. First troponin positive. -Start NTG drip ADDENDUM #2: ~3383 Paged by  RN Jocelyn Hill patient began to have multiple pauses longest 6.5 second.  -Transcutaneous pacers were placed. -NTG discontinued., beta blocker discontinued., -Atropine 3 given patient's heart rate now, alternating between pauses, bradycardia 30 bpm, tachycardia 117 bpm. -BP has remained adequate throughout. -Cardiology paged to bedside. Spoke with Jocelyn Hill, placement of temporary pacer until beta blocker washes out of patient system?  Pulmonary HTN  -See CHF  Bradycardia/Asystole -See CHF   Acute on CKD stage III  Recent Labs Lab 03/15/17 0222 03/16/17 0758 03/16/17 1403 03/17/17 0324 03/18/17 0346 03/19/17 0316  CREATININE 3.53* 4.83* 5.12* 5.00* 4.47* 3.51*  -Improving but still not at a stage safe for cardiac catheterization unless patient willing to risk having to be on HD -Nephrology following  Chronic anemia -No  overt sign of bleeding -Anemia panel pending Trend CBC -Occult  blood pending -Hold ferrous sulfate   Diabetes type 2 controlled with, complication -Hemoglobin A1c = 6.2 -Moderate SSI  Hypothyroidism -Synthroid 112 g daily  Acute encephalopathy  -Multifactorial hyperperfusion, bradycardia, depression, chest pain -Resolved -Would be very judicious on use of sedating medication: Hold Ambien, Ultram, Zanaflex, Lyrica       DVT prophylaxis: Subcutaneous heparin Code Status: Full Family Communication: None Disposition Plan: TBD   Consultants:  Cardiology PC CM    Procedures/Significant Events:  9/13 Admit 9/13 Echocardiogram: LVEF= 60-65%. -Grade 2 diastolic dysfunction.-Severe MR.-Mildly dilated LA and RA.-, mild to mod TR,. PAP 60 mm Hg. 9/18 TEE: EF 55-60%.-Severe restriction anterior/posterior leaflet MV w/ severe regurgitation  9/17 CXR: Bilateral pleural effusion.-Mild pulmonary interstitial edema.-Cardiomegaly  9/17 Brady/hypotensive/ intubated > transfer to ICU 9/20 patient to have pauses longest 6.7 seconds/bradycardia    I have personally reviewed and interpreted all radiology studies and my findings are as above.  VENTILATOR SETTINGS: None   Cultures 9/13 HIV negative 9/16 MRSA PCR >> neg    Antimicrobials: Anti-infectives    Start     Dose/Rate Stop   03/16/17 1000  azithromycin (ZITHROMAX) tablet 250 mg  Status:  Discontinued     250 mg 03/16/17 1316   03/14/17 0900  azithromycin (ZITHROMAX) 500 mg in dextrose 5 % 250 mL IVPB  Status:  Discontinued     500 mg 250 mL/hr over 60 Minutes 03/16/17 0804       Devices None   LINES / TUBES:  9/17 ETT     Continuous Infusions: . sodium chloride Stopped (03/17/17 4680)  . sodium chloride 10 mL/hr at 03/18/17 1000     Objective: Vitals:   03/19/17 0500 03/19/17 0600 03/19/17 0700 03/19/17 0731  BP: (!) 168/88 (!) 187/90 (!) 184/90   Pulse: 78 79 81   Resp: 17 (!) 33 (!) 31   Temp:      TempSrc:      SpO2: 97% 93% 91% 93%  Weight:        Height:        Intake/Output Summary (Last 24 hours) at 03/19/17 3212 Last data filed at 03/19/17 0700  Gross per 24 hour  Intake           291.26 ml  Output             2850 ml  Net         -2558.74 ml   Filed Weights   03/16/17 0331 03/17/17 0403 03/18/17 0402  Weight: 147 lb 11.3 oz (67 kg) 147 lb 14.9 oz (67.1 kg) 150 lb 2.1 oz (68.1 kg)    Examination:  General: A/O 4, No acute respiratory distress, cachectic ENT: Negative Runny nose, negative gingival bleeding, EXTREMELY POOR dentation (cracked broken teeth, periodontal disease) Neck:  Negative scars, masses, torticollis, lymphadenopathy, JVD Lungs: Clear to auscultation bilaterally without wheezes or crackles Cardiovascular: Tachycardic, Regular rhythm without murmur gallop or rub normal S1 and S2 Abdomen: negative abdominal pain, nondistended, positive soft, bowel sounds, no rebound, no ascites, no appreciable mass Extremities: No significant cyanosis, clubbing, or edema bilateral lower extremities Skin: Negative rashes, lesions, ulcers Psychiatric:  Cannot fully assess as patient moaning in pain obvious discomfort.   Central nervous system:  Cranial nerves II through XII intact, tongue/uvula midline, all extremities muscle strength 5/5, sensation intact throughout, negative dysarthria, negative expressive aphasia, negative receptive aphasia.  .     Data Reviewed: Care during  the described time interval was provided by me .  I have reviewed this patient's available data, including medical history, events of note, physical examination, and all test results as part of my evaluation.   CBC:  Recent Labs Lab 03/16/17 0758 03/16/17 1403 03/17/17 0324 03/18/17 0346 03/19/17 0316  WBC 11.5* 12.6* 13.6* 10.0 9.2  HGB 10.7* 10.2* 10.3* 9.7* 9.4*  HCT 30.3* 30.0* 29.2* 28.1* 27.5*  MCV 75.8* 75.6* 73.9* 74.7* 75.5*  PLT 270 258 287 226 761   Basic Metabolic Panel:  Recent Labs Lab 03/15/17 0222 03/16/17 0758  03/16/17 1403 03/17/17 0324 03/18/17 0346 03/19/17 0316  NA 134* 129* 129* 132* 136 139  K 4.3 5.1 5.4* 4.3 4.1 3.9  CL 100* 96* 98* 101 102 108  CO2 22 19* 18* 21* 23 21*  GLUCOSE 165* 185* 228* 99 96 98  BUN 37* 60* 63* 65* 66* 56*  CREATININE 3.53* 4.83* 5.12* 5.00* 4.47* 3.51*  CALCIUM 8.7* 8.4* 7.9* 8.2* 8.6* 8.4*  MG 1.7  --  1.9 2.4 2.3 2.1  PHOS  --  6.9* 7.4* 6.3* 5.7* 5.6*   GFR: Estimated Creatinine Clearance: 16.7 mL/min (A) (by C-G formula based on SCr of 3.51 mg/dL (H)). Liver Function Tests:  Recent Labs Lab 03/15/17 0222 03/16/17 0758 03/16/17 1403 03/17/17 0324 03/18/17 0346 03/19/17 0316  AST 18  --  395*  --   --   --   ALT 23  --  440*  --   --   --   ALKPHOS 83  --  81  --   --   --   BILITOT 0.9  --  1.4*  --   --   --   PROT 5.7*  --  5.4*  --   --   --   ALBUMIN 3.0* 3.0* 2.9* 3.2* 3.1* 3.0*   No results for input(s): LIPASE, AMYLASE in the last 168 hours. No results for input(s): AMMONIA in the last 168 hours. Coagulation Profile:  Recent Labs Lab 03/17/17 0439  INR 1.53   Cardiac Enzymes:  Recent Labs Lab 03/13/17 0202 03/14/17 1501 03/14/17 2026 03/15/17 0222 03/16/17 1403  TROPONINI 0.04* 0.03* 0.03* <0.03 0.11*   BNP (last 3 results) No results for input(s): PROBNP in the last 8760 hours. HbA1C: No results for input(s): HGBA1C in the last 72 hours. CBG:  Recent Labs Lab 03/18/17 1148 03/18/17 1557 03/18/17 2026 03/18/17 2350 03/19/17 0307  GLUCAP 123* 106* 155* 127* 93   Lipid Profile: No results for input(s): CHOL, HDL, LDLCALC, TRIG, CHOLHDL, LDLDIRECT in the last 72 hours. Thyroid Function Tests: No results for input(s): TSH, T4TOTAL, FREET4, T3FREE, THYROIDAB in the last 72 hours. Anemia Panel: No results for input(s): VITAMINB12, FOLATE, FERRITIN, TIBC, IRON, RETICCTPCT in the last 72 hours. Urine analysis:    Component Value Date/Time   COLORURINE YELLOW 03/12/2017 [place] 03/12/2017  1349   LABSPEC 1.008 03/12/2017 1349   PHURINE 6.0 03/12/2017 1349   GLUCOSEU NEGATIVE 03/12/2017 1349   HGBUR NEGATIVE 03/12/2017 1349   BILIRUBINUR NEGATIVE 03/12/2017 1349   KETONESUR NEGATIVE 03/12/2017 1349   PROTEINUR 100 (A) 03/12/2017 1349   NITRITE NEGATIVE 03/12/2017 1349   LEUKOCYTESUR TRACE (A) 03/12/2017 1349   Sepsis Labs: _0 (procalcitonin:4,lacticidven:4)  ) Recent Results (from the past 240 hour(s))  MRSA PCR Screening     Status: None   Collection Time: 03/12/17  9:52 AM  Result Value Ref Range Status   MRSA by PCR NEGATIVE  NEGATIVE Final    Comment:        The GeneXpert MRSA Assay (FDA approved for NASAL specimens only), is one component of a comprehensive MRSA colonization surveillance program. It is not intended to diagnose MRSA infection nor to guide or monitor treatment for MRSA infections.   MRSA PCR Screening     Status: None   Collection Time: 03/15/17  8:32 PM  Result Value Ref Range Status   MRSA by PCR NEGATIVE NEGATIVE Final    Comment:        The GeneXpert MRSA Assay (FDA approved for NASAL specimens only), is one component of a comprehensive MRSA colonization surveillance program. It is not intended to diagnose MRSA infection nor to guide or monitor treatment for MRSA infections.          Radiology Studies: Dg Chest Port 1 View  Result Date: 03/18/2017 CLINICAL DATA:  Respiratory failure EXAM: PORTABLE CHEST 1 VIEW COMPARISON:  03/17/2017 FINDINGS: Endotracheal tube and NG tube are unchanged. Cardiomegaly with vascular congestion. Layering right effusion with right lower lobe atelectasis or infiltrate and left retrocardiac opacity, unchanged. Possible small left effusion. IMPRESSION: Continued bilateral effusions, right greater the left with bibasilar atelectasis or infiltrates. Cardiomegaly, vascular congestion. Electronically Signed   By: Rolm Baptise M.D.   On: 03/18/2017 08:52   Dg Abd Portable 1v  Result Date:  03/17/2017 CLINICAL DATA:  Orogastric tube placement EXAM: PORTABLE ABDOMEN - 1 VIEW COMPARISON:  March 16, 2017 FINDINGS: Orogastric tube tip and side port in stomach. There is a paucity of bowel gas. There is no bowel dilatation or air-fluid level to suggest obstruction. No free air. There are pleural effusions bilaterally with bibasilar consolidation. There is cardiomegaly. IMPRESSION: Orogastric tube tip and side port in stomach. Paucity of bowel gas may be seen normally but may also be indicative of enteritis or early ileus. Bowel obstruction not felt to be likely. No free air. Cardiomegaly. Question alveolar edema versus pneumonia in the bases. Electronically Signed   By: Lowella Grip III M.D.   On: 03/17/2017 11:02        Scheduled Meds: . amLODipine  10 mg Oral Daily  . aspirin  81 mg Per Tube Daily  . chlorhexidine gluconate (MEDLINE KIT)  15 mL Mouth Rinse BID  . Chlorhexidine Gluconate Cloth  6 each Topical Daily  . docusate  100 mg Per Tube BID  . heparin subcutaneous  5,000 Units Subcutaneous Q8H  . insulin aspart  0-15 Units Subcutaneous Q4H  . ipratropium-albuterol  3 mL Nebulization TID  . labetalol  100 mg Oral BID  . levothyroxine  112 mcg Per Tube QAC breakfast  . mouth rinse  15 mL Mouth Rinse QID  . pantoprazole (PROTONIX) IV  40 mg Intravenous Daily  . sodium chloride flush  10-40 mL Intracatheter Q12H  . sodium chloride flush  3 mL Intravenous Q12H   Continuous Infusions: . sodium chloride Stopped (03/17/17 9323)  . sodium chloride 10 mL/hr at 03/18/17 1000     LOS: 7 days    Time spent: 40 minutes    Traevion Poehler, Geraldo Docker, MD [facility] Pager [phone]   If 7PM-7AM, please contact night-coverage [email or web address] Password [email or web address] 03/19/2017, 7:52 AM

## 2017-03-19 NOTE — Plan of Care (Signed)
Problem: Activity: Goal: Risk for activity intolerance will decrease Outcome: Not Progressing Pt currently nauseous with no vomiting, unable to tolerate activity at this time.

## 2017-03-19 NOTE — Progress Notes (Signed)
Pt had a 6.5 second pause and bradycardia 28-30 shortly after scheduled IV labetalol given at 1800. Pt was given atropine at 1818. Pt's other VSS. Zoll pacer pads were applied. Labetalol and IV nitroglycerin were discontinued. Dr. Sherral Hammers and Dr. Harrington Challenger were paged. Pt had another pause and was given atropine at 1829. A third pause occurred and atropine was given at 1850. Pt had no loss of consciousness throughout. No pacing occurred at this time.  Pt's daughter-in-law, Rise Paganini, was at bedside and made aware of events.  Pt currently in NSR, HR 70.

## 2017-03-19 NOTE — Progress Notes (Signed)
[facility] Progress Note  Subjective: nauseous, tearful, won't say what's wrong, no SOB or CP   Vitals:   03/19/17 1000 03/19/17 1100 03/19/17 1200 03/19/17 1224  BP: (!) 176/82 (!) 164/84 (!) 173/85   Pulse: 72 68 75   Resp: (!) 25 12 16    Temp:    99.9 F (37.7 C)  TempSrc:    Core (Comment)  SpO2: 98% 100% 99%   Weight:      Height:        Inpatient medications: . amLODipine  10 mg Oral Daily  . aspirin  81 mg Per Tube Daily  . chlorhexidine gluconate (MEDLINE KIT)  15 mL Mouth Rinse BID  . Chlorhexidine Gluconate Cloth  6 each Topical Daily  . docusate  100 mg Per Tube BID  . heparin subcutaneous  5,000 Units Subcutaneous Q8H  . hydrALAZINE  15 mg Intravenous Q6H  . insulin aspart  0-15 Units Subcutaneous Q4H  . ipratropium-albuterol  3 mL Nebulization TID  . labetalol  100 mg Oral BID  . levothyroxine  112 mcg Per Tube QAC breakfast  . mouth rinse  15 mL Mouth Rinse QID  . pantoprazole (PROTONIX) IV  40 mg Intravenous Daily  . sodium chloride flush  10-40 mL Intracatheter Q12H  . sodium chloride flush  3 mL Intravenous Q12H   . sodium chloride Stopped (03/17/17 1914)  . sodium chloride 10 mL/hr at 03/18/17 1000   sodium chloride, acetaminophen **OR** acetaminophen, albuterol, bisacodyl, hydrALAZINE, ondansetron (ZOFRAN) IV, sennosides, sodium chloride flush, sodium chloride flush  Exam: On FM O2, responds ok Chest clear ant and post RRR no RG, 3/6 sem Abd soft ntnd no ascites Ext no LE edema NF, follows commands  Home meds :   - norvasc/ hydralazine 100 tid/ labetalol 200 bid/ minoxidil 2.5 qd - albuterol/ asa/ lipitor/ T4 / singulair/ PPI / KCl  - wellbutrin / cymbalta/ neurontin/ Lyrica / ambien prn / oxy prn   Creat 3.1- 3.5 range here this admission; creat at office visit w/ [facility] was 2.98 (per Dr Hollie Salk)    Impression: 1.  Acute on CKD 4 - creat down to 3.5.  Baseline creat is 3.0.  Stable volume status. No new suggestions , will  sign off for now.   2.  Resp failure - improved, extubated 3.  Severe MR - per cards 4.  HTN - bp's up, some of BP meds restarted, titrating as needed 5.  Hx CVA/ depression/ chronic pain  Plan - as above   Kelly Splinter MD [facility] pager [phone]   03/19/2017, 12:39 PM    Recent Labs Lab 03/17/17 0324 03/18/17 0346 03/19/17 0316  NA 132* 136 139  K 4.3 4.1 3.9  CL 101 102 108  CO2 21* 23 21*  GLUCOSE 99 96 98  BUN 65* 66* 56*  CREATININE 5.00* 4.47* 3.51*  CALCIUM 8.2* 8.6* 8.4*  PHOS 6.3* 5.7* 5.6*    Recent Labs Lab 03/15/17 0222  03/16/17 1403 03/17/17 0324 03/18/17 0346 03/19/17 0316  AST 18  --  395*  --   --   --   ALT 23  --  440*  --   --   --   ALKPHOS 83  --  81  --   --   --   BILITOT 0.9  --  1.4*  --   --   --   PROT 5.7*  --  5.4*  --   --   --  ALBUMIN 3.0*  < > 2.9* 3.2* 3.1* 3.0*  < > = values in this interval not displayed.  Recent Labs Lab 03/17/17 0324 03/18/17 0346 03/19/17 0316  WBC 13.6* 10.0 9.2  HGB 10.3* 9.7* 9.4*  HCT 29.2* 28.1* 27.5*  MCV 73.9* 74.7* 75.5*  PLT 287 226 229   Iron/TIBC/Ferritin/ %Sat    Component Value Date/Time   IRON 19 (L) 03/14/2017 0320   TIBC 228 (L) 03/14/2017 0320   FERRITIN 41 03/14/2017 0320   IRONPCTSAT 8 (L) 03/14/2017 0320

## 2017-03-19 NOTE — Progress Notes (Signed)
Dr. Sherral Hammers paged regarding patient still dry heaving and nauseous. Pt is tearful and is not feeling better after extra dose Zofran given. IV hydralazine given for elevated BP. Awaiting call back.

## 2017-03-19 NOTE — Progress Notes (Signed)
  Speech Language Pathology Treatment: Dysphagia  Patient Details Name: Jocelyn Hill MRN: [id number] DOB: [birth year] Today's Date: 03/19/2017 Time: 4193-7902 SLP Time Calculation (min) (ACUTE ONLY): 14 min  Assessment / Plan / Recommendation Clinical Impression  Paged by RN that pt continues to ask for food/liquid, that she "hasn't eaten in 5 days and thinks that's possibly why she is nauseated." Pt appeared to be feeling better than earlier this morning, decreased work of breathing stable. Consumed straw sips water, ice chips with eventual increased dyspnea, RR stable 17-20. Became nauseated again toward end of session. Pt may have sips water ONLY and ice chips when respirations stable. Will continue to follow.   HPI HPI: Jocelyn Hill. Broweris a 66 y.o.femalewith medical history significant for poorly controlled hypertension, GERD, stage III chronic kidney disease, bronchitis, diabetes mellitus, peripheral neuropathy and hypothyroidism admitted with shortness of breath. Found to have a BNP of 1700 with a new diagnosis of CHF. CXR continued bilateral effusions, right greater the left with bibasilar atelectasis or infiltrates.  Per MD note her chest pressure/ache is described as located midsternally with some radiation in all direction and  feeling like something is stuck in her throat. Pt coded 2023-03-21 and intubated and extubated 9/19. BSE 03/13/17 with suspected esophageal dysphagia (sope with GI 10 yrs ago), recommended to follow up with GI as outpatient, regular and thin recommended. Recent dental extraction (upper).      SLP Plan  Continue with current plan of care       Recommendations  Diet recommendations: Thin liquid (ice chip) Liquids provided via: Straw;Teaspoon Medication Administration: Via alternative means                Oral Care Recommendations: Oral care QID Follow up Recommendations: None SLP Visit Diagnosis: Dysphagia, unspecified (R13.10) Plan: Continue with current  plan of care       GO                Houston Siren 03/19/2017, 3:23 PM  Orbie Pyo Colvin Caroli.Ed [employer] [phone]

## 2017-03-19 NOTE — Progress Notes (Signed)
CRITICAL VALUE ALERT  Critical Value:  Troponin 0.10  Date & Time Notied:  03/19/17: 3685  Provider Notified: Dr. Sherral Hammers text paged.  Orders Received/Actions taken: awaiting call back.

## 2017-03-19 NOTE — Evaluation (Signed)
Clinical/Bedside Swallow Evaluation Patient Details  Name: Karsen Fellows. Vanosdol MRN: [id number] Date of Birth: 1950-07-09  Today's Date: 03/19/2017 Time: SLP Start Time (ACUTE ONLY): 0849 SLP Stop Time (ACUTE ONLY): 0902 SLP Time Calculation (min) (ACUTE ONLY): 13 min  Past Medical History:  Past Medical History:  Diagnosis Date  . Anxiety   . Arthritis   . Bronchitis   . Depression   . Diabetes mellitus without complication ([place])   . Diet-controlled diabetes mellitus ([place])   . GERD (gastroesophageal reflux disease)   . Hyperlipidemia   . Hypertension   . Hypothyroidism   . Stroke [facility])    Past Surgical History:  Past Surgical History:  Procedure Laterality Date  . ABDOMINAL HYSTERECTOMY     PARTIALS  . TONSILLECTOMY    . TUBAL LIGATION     HPI:  Anah Billard. Broweris a 66 y.o.femalewith medical history significant for poorly controlled hypertension, GERD, stage III chronic kidney disease, bronchitis, diabetes mellitus, peripheral neuropathy and hypothyroidism admitted with shortness of breath. Found to have a BNP of 1700 with a new diagnosis of CHF. CXR continued bilateral effusions, right greater the left with bibasilar atelectasis or infiltrates.  Per MD note her chest pressure/ache is described as located midsternally with some radiation in all direction and  feeling like something is stuck in her throat. Pt coded 2023/03/20 and intubated and extubated 9/19. BSE 03/13/17 with suspected esophageal dysphagia (sope with GI 10 yrs ago), recommended to follow up with GI as outpatient, regular and thin recommended. Recent dental extraction (upper).   Assessment / Plan / Recommendation Clinical Impression  Pt significantly nauseated however (surprisingly) stated she did want to attempt po's. Oral care provided followed by ice chip and teaspoon water. Mildly prolonged oral transit and labial leakage with water; possible delayed trigger of pharyngeal swallow. No po's recommended, except for ice  chips as desired after oral care given current state with nausea, dyspnea, intermittent moanng. SLP will continue to follow for po recommendations. See HPI for suspicions of esophageal dysphagia.  SLP Visit Diagnosis: Dysphagia, unspecified (R13.10)    Aspiration Risk  Mild aspiration risk    Diet Recommendation Ice chips PRN after oral care        Other  Recommendations Oral Care Recommendations: Oral care QID   Follow up Recommendations None      Frequency and Duration min 2x/week  2 weeks       Prognosis Prognosis for Safe Diet Advancement: Good      Swallow Study   General HPI: Paula Busenbark. Broweris a 66 y.o.femalewith medical history significant for poorly controlled hypertension, GERD, stage III chronic kidney disease, bronchitis, diabetes mellitus, peripheral neuropathy and hypothyroidism admitted with shortness of breath. Found to have a BNP of 1700 with a new diagnosis of CHF. CXR continued bilateral effusions, right greater the left with bibasilar atelectasis or infiltrates.  Per MD note her chest pressure/ache is described as located midsternally with some radiation in all direction and  feeling like something is stuck in her throat. Pt coded 2023-03-20 and intubated and extubated 9/19. BSE 03/13/17 with suspected esophageal dysphagia (sope with GI 10 yrs ago), recommended to follow up with GI as outpatient, regular and thin recommended. Recent dental extraction (upper). Type of Study: Bedside Swallow Evaluation Previous Swallow Assessment:  (see HPI) Diet Prior to this Study: NPO Temperature Spikes Noted: No Respiratory Status: Other (comment) (HFNC) History of Recent Intubation: Yes Length of Intubations (days): 2 days Date extubated: 03/18/17 Behavior/Cognition: Requires cueing;Lethargic/Drowsy;Other (Comment) (  very nauseated) Oral Cavity Assessment: Excessive secretions (lips dry) Oral Care Completed by SLP: Yes Oral Cavity - Dentition: Poor condition;Missing dentition  (lower dentition, upper recently extracted) Vision: Functional for self-feeding Self-Feeding Abilities: Able to feed self Patient Positioning: Upright in bed Baseline Vocal Quality: Normal Volitional Cough: Strong Volitional Swallow: Able to elicit    Oral/Motor/Sensory Function Overall Oral Motor/Sensory Function:  (will assess when feeling better, no focal weakness)   Ice Chips Ice chips: Impaired Presentation: Spoon Oral Phase Functional Implications: Prolonged oral transit Pharyngeal Phase Impairments: Suspected delayed Swallow   Thin Liquid Thin Liquid: Impaired Presentation: Spoon Oral Phase Impairments: Reduced labial seal Oral Phase Functional Implications: Right anterior spillage Pharyngeal  Phase Impairments: Suspected delayed Swallow    Nectar Thick Nectar Thick Liquid: Not tested   Honey Thick Honey Thick Liquid: Not tested   Puree Puree: Not tested   Solid   GO   Solid: Not tested        Houston Siren 03/19/2017,9:15 AM  Orbie Pyo Colvin Caroli.Ed [employer] [phone]

## 2017-03-19 NOTE — Progress Notes (Signed)
Progress Note  Patient Name: Jocelyn Hill. 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 amLODipine  10 mg Oral Daily  . aspirin  81 mg Per Tube Daily  . chlorhexidine gluconate (MEDLINE KIT)  15 mL Mouth Rinse BID  . Chlorhexidine Gluconate Cloth  6 each Topical Daily  . docusate  100 mg Per Tube BID  . heparin subcutaneous  5,000 Units Subcutaneous Q8H  . hydrALAZINE  15 mg Intravenous Q6H  . insulin aspart  0-15 Units Subcutaneous Q4H  . ipratropium-albuterol  3 mL Nebulization TID  . labetalol  100 mg Oral BID  . levothyroxine  112 mcg Per Tube QAC breakfast  . mouth rinse  15 mL Mouth Rinse QID  . pantoprazole (PROTONIX) IV  40 mg Intravenous Daily  . sodium chloride flush  10-40 mL Intracatheter Q12H  . sodium chloride flush  3 mL Intravenous Q12H   Continuous Infusions: . sodium chloride Stopped (03/17/17 6761)  . sodium chloride 10 mL/hr at 03/18/17 1000   PRN Meds: sodium chloride, acetaminophen **OR** acetaminophen, albuterol, bisacodyl, hydrALAZINE, ondansetron (ZOFRAN) IV, sennosides, sodium chloride flush, sodium chloride flush   Vital Signs    Vitals:   03/19/17 0800 03/19/17 0835 03/19/17 0900 03/19/17 1000  BP: (!) 184/92  (!) 175/81 (!) 176/82  Pulse: 80  82 72  Resp: (!) 25  (!) 23 (!) 25  Temp:  97.9 F (36.6 C)    TempSrc:  Oral    SpO2: (!) 88%  96% 98%  Weight:   151 lb 10.8 oz (68.8 kg)   Height:        Intake/Output Summary (Last 24 hours) at 03/19/17 1052 Last data filed at 03/19/17 0700  Gross per 24 hour  Intake              210 ml  Output             2650 ml  Net            -2440 ml   Filed Weights   03/17/17 0403 03/18/17 0402 03/19/17 0900  Weight: 147 lb 14.9 oz (67.1 kg) 150 lb 2.1 oz (68.1 kg) 151 lb 10.8 oz (68.8 kg)    Telemetry      NSR  - Personally Reviewed, PVCs   ECG     NSR  - Personally Reviewed  Physical Exam   GEN: chronically ill appearing  Neck: No JVD, hoarse voice  Cardiac: RR with premature beats,  2-3 / 6 systolic murmur  Respiratory: Clear to auscultation bilaterally. GI: Soft, nontender, non-distended  MS: No edema; No deformity. Neuro:  Nonfocal  Psych: Normal affect   Labs    Chemistry Recent Labs Lab 03/15/17 0222  03/16/17 1403 03/17/17 0324 03/18/17 0346 03/19/17 0316  NA 134*  < > 129* 132* 136 139  K 4.3  < > 5.4* 4.3 4.1 3.9  CL 100*  < > 98* 101 102 108  CO2 22  < > 18* 21* 23 21*  GLUCOSE 165*  < > 228* 99 96 98  BUN 37*  < > 63* 65* 66* 56*  CREATININE 3.53*  < > 5.12* 5.00* 4.47* 3.51*  CALCIUM 8.7*  < > 7.9* 8.2* 8.6* 8.4*  PROT 5.7*  --  5.4*  --   --   --   ALBUMIN 3.0*  < > 2.9* 3.2* 3.1* 3.0*  AST 18  --  395*  --   --   --   ALT 23  --  440*  --   --   --   ALKPHOS 83  --  81  --   --   --   BILITOT 0.9  --  1.4*  --   --   --   GFRNONAA 13*  < > 8* 8* 9* 13*  GFRAA 15*  < > 9* 10* 11* 15*  ANIONGAP 12  < > 13 10 11 10   < > = values in this interval not displayed.   Hematology Recent Labs Lab 03/17/17 0324 03/18/17 0346 03/19/17 0316  WBC 13.6* 10.0 9.2  RBC 3.95 3.76* 3.64*  HGB 10.3* 9.7* 9.4*  HCT 29.2* 28.1* 27.5*  MCV 73.9* 74.7* 75.5*  MCH 26.1 25.8* 25.8*  MCHC 35.3 34.5 34.2  RDW 13.3 13.5 13.6  PLT 287 226 229    Cardiac Enzymes Recent Labs Lab 03/14/17 1501 03/14/17 2026 03/15/17 0222 03/16/17 1403  TROPONINI 0.03* 0.03* <0.03 0.11*   No results for input(s): TROPIPOC in the last 168 hours.   BNP Recent Labs Lab 03/12/17 1400  BNP 2,230.5*     DDimer No results for input(s): DDIMER in the last 168 hours.   Radiology    Dg Chest Port 1 View  Result Date: 03/18/2017 CLINICAL DATA:  Respiratory failure EXAM: PORTABLE CHEST 1 VIEW COMPARISON:  03/17/2017 FINDINGS: Endotracheal tube and NG tube are unchanged.  Cardiomegaly with vascular congestion. Layering right effusion with right lower lobe atelectasis or infiltrate and left retrocardiac opacity, unchanged. Possible small left effusion. IMPRESSION: Continued bilateral effusions, right greater the left with bibasilar atelectasis or infiltrates. Cardiomegaly, vascular congestion. Electronically Signed   By: Rolm Baptise M.D.   On: 03/18/2017 08:52   Dg Abd Portable 1v  Result Date: 03/17/2017 CLINICAL DATA:  Orogastric tube placement EXAM: PORTABLE ABDOMEN - 1 VIEW COMPARISON:  March 16, 2017 FINDINGS: Orogastric tube tip and side port in stomach. There is a paucity of bowel gas. There is no bowel dilatation or air-fluid level to suggest obstruction. No free air. There are pleural effusions bilaterally with bibasilar consolidation. There is cardiomegaly. IMPRESSION: Orogastric tube tip and side port in stomach. Paucity of bowel gas may be seen normally but may also be indicative of enteritis or early ileus. Bowel obstruction not felt to be likely. No free air. Cardiomegaly. Question alveolar edema versus pneumonia in the bases. Electronically Signed   By: Lowella Grip III M.D.   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  Mitral regurgitation: chronic , severe, BP  Is still elevated.   titrating BP meds up .   2.   CKD - plans per nephrology   Slow improvement    For questions or updates, please contact [place] Please consult [email or web address] for contact info under Cardiology/STEMI.      Signed, Mertie Moores, MD  03/19/2017, 10:52 AM

## 2017-03-20 DIAGNOSIS — J9601 Acute respiratory failure with hypoxia: Secondary | ICD-10-CM

## 2017-03-20 DIAGNOSIS — E039 Hypothyroidism, unspecified: Secondary | ICD-10-CM

## 2017-03-20 LAB — CBC
HCT: 27.4 % — ABNORMAL LOW (ref 36.0–46.0)
Hemoglobin: 9.3 g/dL — ABNORMAL LOW (ref 12.0–15.0)
MCH: 25.8 pg — ABNORMAL LOW (ref 26.0–34.0)
MCHC: 33.9 g/dL (ref 30.0–36.0)
MCV: 76.1 fL — ABNORMAL LOW (ref 78.0–100.0)
Platelets: 234 10*3/uL (ref 150–400)
RBC: 3.6 MIL/uL — ABNORMAL LOW (ref 3.87–5.11)
RDW: 14 % (ref 11.5–15.5)
WBC: 11 10*3/uL — ABNORMAL HIGH (ref 4.0–10.5)

## 2017-03-20 LAB — RENAL FUNCTION PANEL
Albumin: 3.2 g/dL — ABNORMAL LOW (ref 3.5–5.0)
Anion gap: 13 (ref 5–15)
BUN: 55 mg/dL — ABNORMAL HIGH (ref 6–20)
CO2: 20 mmol/L — ABNORMAL LOW (ref 22–32)
Calcium: 9.6 mg/dL (ref 8.9–10.3)
Chloride: 107 mmol/L (ref 101–111)
Creatinine, Ser: 3.56 mg/dL — ABNORMAL HIGH (ref 0.44–1.00)
GFR calc Af Amer: 14 mL/min — ABNORMAL LOW (ref >60)
GFR calc non Af Amer: 12 mL/min — ABNORMAL LOW (ref >60)
Glucose, Bld: 133 mg/dL — ABNORMAL HIGH (ref 65–99)
Phosphorus: 5.1 mg/dL — ABNORMAL HIGH (ref 2.5–4.6)
Potassium: 3.8 mmol/L (ref 3.5–5.1)
Sodium: 140 mmol/L (ref 135–145)

## 2017-03-20 LAB — RETICULOCYTES
RBC.: 3.6 MIL/uL — ABNORMAL LOW (ref 3.87–5.11)
Retic Count, Absolute: 97.2 10*3/uL (ref 19.0–186.0)
Retic Ct Pct: 2.7 % (ref 0.4–3.1)

## 2017-03-20 LAB — GLUCOSE, CAPILLARY
Glucose-Capillary: 108 mg/dL — ABNORMAL HIGH (ref 65–99)
Glucose-Capillary: 130 mg/dL — ABNORMAL HIGH (ref 65–99)
Glucose-Capillary: 133 mg/dL — ABNORMAL HIGH (ref 65–99)
Glucose-Capillary: 136 mg/dL — ABNORMAL HIGH (ref 65–99)
Glucose-Capillary: 151 mg/dL — ABNORMAL HIGH (ref 65–99)

## 2017-03-20 LAB — IRON AND TIBC
Iron: 62 ug/dL (ref 28–170)
Saturation Ratios: 24 % (ref 10.4–31.8)
TIBC: 256 ug/dL (ref 250–450)
UIBC: 194 ug/dL

## 2017-03-20 LAB — ETHANOL: Alcohol, Ethyl (B): <5 mg/dL (ref <5)

## 2017-03-20 LAB — VITAMIN B12: Vitamin B-12: 6317 pg/mL — ABNORMAL HIGH (ref 180–914)

## 2017-03-20 LAB — FOLATE: Folate: 19.6 ng/mL (ref >5.9)

## 2017-03-20 LAB — TROPONIN I: Troponin I: 0.11 ng/mL (ref <0.03)

## 2017-03-20 LAB — FERRITIN: Ferritin: 148 ng/mL (ref 11–307)

## 2017-03-20 MED ORDER — DOCUSATE SODIUM 100 MG PO CAPS
100.0000 mg | ORAL_CAPSULE | Freq: Two times a day (BID) | ORAL | Status: DC
  Administered 2017-03-20 – 2017-03-29 (×17): 100 mg via ORAL
  Filled 2017-03-20 (×20): qty 1

## 2017-03-20 MED ORDER — INSULIN ASPART 100 UNIT/ML ~~LOC~~ SOLN
0.0000 [IU] | Freq: Three times a day (TID) | SUBCUTANEOUS | Status: DC
  Administered 2017-03-20: 2 [IU] via SUBCUTANEOUS
  Administered 2017-03-21: 3 [IU] via SUBCUTANEOUS
  Administered 2017-03-21: 8 [IU] via SUBCUTANEOUS
  Administered 2017-03-21 – 2017-03-22 (×3): 2 [IU] via SUBCUTANEOUS
  Administered 2017-03-22 – 2017-03-23 (×2): 3 [IU] via SUBCUTANEOUS
  Administered 2017-03-23: 2 [IU] via SUBCUTANEOUS
  Administered 2017-03-23: 5 [IU] via SUBCUTANEOUS
  Administered 2017-03-24 (×2): 3 [IU] via SUBCUTANEOUS
  Administered 2017-03-25: 2 [IU] via SUBCUTANEOUS
  Administered 2017-03-25 – 2017-03-26 (×2): 3 [IU] via SUBCUTANEOUS
  Administered 2017-03-26: 5 [IU] via SUBCUTANEOUS
  Administered 2017-03-26: 2 [IU] via SUBCUTANEOUS
  Administered 2017-03-27: 5 [IU] via SUBCUTANEOUS
  Administered 2017-03-27 – 2017-03-28 (×2): 3 [IU] via SUBCUTANEOUS
  Administered 2017-03-28 – 2017-03-29 (×3): 2 [IU] via SUBCUTANEOUS
  Administered 2017-03-29 – 2017-03-30 (×2): 3 [IU] via SUBCUTANEOUS
  Administered 2017-03-30: 2 [IU] via SUBCUTANEOUS
  Administered 2017-03-31 – 2017-04-01 (×3): 3 [IU] via SUBCUTANEOUS
  Administered 2017-04-01: 2 [IU] via SUBCUTANEOUS
  Administered 2017-04-02: 5 [IU] via SUBCUTANEOUS
  Administered 2017-04-02 – 2017-04-03 (×3): 2 [IU] via SUBCUTANEOUS
  Administered 2017-04-03 – 2017-04-04 (×3): 3 [IU] via SUBCUTANEOUS
  Administered 2017-04-04: 2 [IU] via SUBCUTANEOUS

## 2017-03-20 NOTE — Progress Notes (Signed)
PROGRESS NOTE    Jocelyn Hill. Hill  JKD:[id number] DOB: June 14, 1951 DOA: 03/12/2017 PCP: [name], MD   Brief Narrative:  66 y.o. BF PMHx Anxiety, Depression, CVA, poorly controlled HTN, CKD stage III, DM type II with Peripheral Neuropathy, Hypothyroidism    Who states that over the past month, she is noted she has become more winded. Then starting on Saturday, 9/8, patient cut the point where she felt like she is having a lot of trouble breathing. The symptoms continued to progress to the point where she could not take it anymore and she came in to the emergency room at [facility] for further evaluation. Patient found to have a BNP of 1700 with a new diagnosis of CHF. Chest x-ray noted moderate bilateral pleural effusions. Patient was given Lasix and put on nitroglycerin patch. Blood pressure systolic in the 099I-338S. Patient initially BiPAP to keep oxygen saturations greater than 90%. In addition, creatinine noted to be 3.2 or baseline was 2.1. Because of worsening creatinine and decompensated CHF, it was felt best to transfer this patient to [facility]. Hospitalists accepted transfer.    Subjective: 9/21 A/O 4, states uncomfortable, negative CP, mild SOB, negative N/V, negative abdominal pain.     Assessment & Plan:   Principal Problem:   Acute on chronic respiratory failure with hypoxia (HCC) Active Problems:   Hypertension   Hypothyroidism   Diet-controlled diabetes mellitus (HCC)   Acute diastolic (congestive) heart failure (HCC)   AKI (acute kidney injury) (HCC)   CKD (chronic kidney disease), stage III   CHF (congestive heart failure) (HCC)   Dysphagia   Malignant hypertension   Microcytic anemia   Respiratory failure with Hypoxia/COPD exacerbation? -Resolved patient currently on room air  -Patient with good air movement/negative wheezing DO NOT believe COPD exacerbation.  -Xopenex TID.  -    Substance abuse -UDS positive for benzodiazepine, opiates,  marijuana. Unable to differentiate if positive prior to hospitalization for Benzodiazepine/Opiates.   Acute Diastolic CHF/severe MR -Strict in and out since admission -5.9 L -Daily weight Filed Weights   03/17/17 0403 03/18/17 0402 03/19/17 0900  Weight: 147 lb 14.9 oz (67.1 kg) 150 lb 2.1 oz (68.1 kg) 151 lb 10.8 oz (68.8 kg)  -Discontinue all nodal blocking agents -Hydralazine 15 mg QID -Hydralazine PRN -Monitor closely for bradycardia. Evening of 9/20 patient with episodes of bradycardia/asystole pauses see addendum #2  -Per cardiology, requires Dental consult prior to MV replacement. Unfortunately adult dental services unavailable this week. Have attempted to page Jorja Loa DDS unsuccessfully. -NCM Roque Cash working on seeing a). If dental services available next week. b). No dental services available next week how to transport patient into town to have teeth evaluated/removed. -Cardiology to perform cardiac catheterization.   Recent Labs Lab 03/14/17 2026 03/15/17 0222 03/16/17 1403 03/19/17 1334 03/19/17 1930 03/20/17 0100  TROPONINI 0.03* <0.03 0.11* 0.10* 0.11* 0.11*  -elevated troponin most likely secondary to demand ischemia, continue monitor. -Following discontinuation of beta blocker, nitroglycerin drip overnight patient has stabilized. No further episodes of pulses. -Spoke with Dr. Grayland Jack Cardiology, and he feels we need to proceed with cardiac catheterization, as patient continues to physically deteriorate   Pulmonary HTN  -See CHF   Bradycardia/Asystole -See CHF   Acute on CKD stage III   Recent Labs Lab 03/16/17 1403 03/17/17 0324 03/18/17 0346 03/19/17 0316 03/19/17 1930 03/20/17 0418  CREATININE 5.12* 5.00* 4.47* 3.51* 3.57* 3.56*  -9/21 spoke with Dr. Roney Jaffe Nephrology concerning insertion of Vas-Cath  and immediately dialyzing patient post cardiac catheterization to help prevent further kidney damage. Was informed this practice was  found not to prevent contrast-induced renal failure and therefore no longer recommended. States currently no indication for dialysis. However recommends proceeding with cardiac catheterization and he will follow along and if patient requires HD will place Vas-Cath and perform HD when indicated. -Spoke at length with patient regarding her current renal function and the likely affect of the thigh from the cardiac catheterization of the kidneys. Appears to understand that she will require HD. Although appears depressed has agreed to HD if required.   Chronic anemia -No overt sign of bleeding -Anemia panel pending Trend CBC -Occult blood pending -Hold ferrous sulfate   Diabetes type 2 controlled with, complication -Hemoglobin A1c = 6.2 -Moderate SSI   Hypothyroidism -Synthroid 112 g daily   Acute encephalopathy  -Multifactorial hyperperfusion, bradycardia, depression, chest pain -Resolved -Continue to hold following sedating medication: Hold Ambien, Ultram, Zanaflex, Lyrica    DVT prophylaxis: Subcutaneous heparin Code Status: Full Family Communication: None Disposition Plan: TBD   Consultants:  Cardiology Nephrology PC CM      Procedures/Significant Events:  9/13 Admit 9/13 Echocardiogram: LVEF= 60-65%. -Grade 2 diastolic dysfunction.-Severe MR.-Mildly dilated LA and RA.-, mild to mod TR,. PAP 60 mm Hg. 9/18 TEE: EF 55-60%.-Severe restriction anterior/posterior leaflet MV w/ severe regurgitation  9/17 CXR: Bilateral pleural effusion.-Mild pulmonary interstitial edema.-Cardiomegaly  9/17 Brady/hypotensive/ intubated > transfer to ICU 9/20 patient to have pauses longest 6.7 seconds/bradycardia. All nodal blocking agents and NTG drips discontinued. 3 Amps Atropine administered.     I have personally reviewed and interpreted all radiology studies and my findings are as above.  VENTILATOR SETTINGS: None   Cultures None    Antimicrobials: Antibiotics Given (last 72  hours)    None       Devices     LINES / TUBES:      Continuous Infusions: . sodium chloride Stopped (03/17/17 3419)  . sodium chloride 10 mL/hr at 03/20/17 0700     Objective: Vitals:   03/20/17 0700 03/20/17 0739 03/20/17 0859 03/20/17 0941  BP: (!) 162/97   (!) 168/66  Pulse: 84     Resp: 17     Temp:   97.9 F (36.6 C)   TempSrc:   Oral   SpO2: 96% 93%    Weight:      Height:        Intake/Output Summary (Last 24 hours) at 03/20/17 6222 Last data filed at 03/20/17 0800  Gross per 24 hour  Intake              486 ml  Output             1405 ml  Net             -919 ml   Filed Weights   03/17/17 0403 03/18/17 0402 03/19/17 0900  Weight: 147 lb 14.9 oz (67.1 kg) 150 lb 2.1 oz (68.1 kg) 151 lb 10.8 oz (68.8 kg)    Examination:  General: A/O 4, No acute respiratory distress, cachectic ENT: Negative Runny nose, negative gingival bleeding, EXTREMELY POOR dentation (cracked broken teeth, periodontal disease) Neck:  Negative scars, masses, torticollis, lymphadenopathy, JVD Lungs: Clear to auscultation bilaterally without wheezes or crackles Cardiovascular: Regular rhythm And rate, frequent PVC, positive systolic murmur , negative gallop or rub normal S1 and S2 Abdomen: negative abdominal pain, nondistended, positive soft, bowel sounds, no rebound, no ascites, no appreciable mass Extremities: No  significant cyanosis, clubbing, or edema bilateral lower extremities Skin: Negative rashes, lesions, ulcers Psychiatric:  Cannot fully assess as patient moaning in pain obvious discomfort.   Central nervous system:  Cranial nerves II through XII intact, tongue/uvula midline, all extremities muscle strength 5/5, sensation intact throughout, negative dysarthria, negative expressive aphasia, negative receptive aphasia.  .     Data Reviewed: Care during the described time interval was provided by me .  I have reviewed this patient's available data, including medical  history, events of note, physical examination, and all test results as part of my evaluation.   CBC:  Recent Labs Lab 03/16/17 1403 03/17/17 0324 03/18/17 0346 03/19/17 0316 03/20/17 0406  WBC 12.6* 13.6* 10.0 9.2 11.0*  HGB 10.2* 10.3* 9.7* 9.4* 9.3*  HCT 30.0* 29.2* 28.1* 27.5* 27.4*  MCV 75.6* 73.9* 74.7* 75.5* 76.1*  PLT 258 287 226 229 937   Basic Metabolic Panel:  Recent Labs Lab 03/16/17 1403 03/17/17 0324 03/18/17 0346 03/19/17 0316 03/19/17 1930 03/20/17 0418  NA 129* 132* 136 139 136 140  K 5.4* 4.3 4.1 3.9 3.6 3.8  CL 98* 101 102 108 107 107  CO2 18* 21* 23 21* 21* 20*  GLUCOSE 228* 99 96 98 130* 133*  BUN 63* 65* 66* 56* 54* 55*  CREATININE 5.12* 5.00* 4.47* 3.51* 3.57* 3.56*  CALCIUM 7.9* 8.2* 8.6* 8.4* 8.8* 9.6  MG 1.9 2.4 2.3 2.1 2.1  --   PHOS 7.4* 6.3* 5.7* 5.6*  --  5.1*   GFR: Estimated Creatinine Clearance: 16.5 mL/min (A) (by C-G formula based on SCr of 3.56 mg/dL (H)). Liver Function Tests:  Recent Labs Lab 03/15/17 0222  03/16/17 1403 03/17/17 0324 03/18/17 0346 03/19/17 0316 03/20/17 0418  AST 18  --  395*  --   --   --   --   ALT 23  --  440*  --   --   --   --   ALKPHOS 83  --  81  --   --   --   --   BILITOT 0.9  --  1.4*  --   --   --   --   PROT 5.7*  --  5.4*  --   --   --   --   ALBUMIN 3.0*  < > 2.9* 3.2* 3.1* 3.0* 3.2*  < > = values in this interval not displayed. No results for input(s): LIPASE, AMYLASE in the last 168 hours. No results for input(s): AMMONIA in the last 168 hours. Coagulation Profile:  Recent Labs Lab 03/17/17 0439  INR 1.53   Cardiac Enzymes:  Recent Labs Lab 03/15/17 0222 03/16/17 1403 03/19/17 1334 03/19/17 1930 03/20/17 0100  TROPONINI <0.03 0.11* 0.10* 0.11* 0.11*   BNP (last 3 results) No results for input(s): PROBNP in the last 8760 hours. HbA1C: No results for input(s): HGBA1C in the last 72 hours. CBG:  Recent Labs Lab 03/19/17 1633 03/19/17 2018 03/20/17 0001  03/20/17 0310 03/20/17 0855  GLUCAP 181* 133* 108* 130* 136*   Lipid Profile: No results for input(s): CHOL, HDL, LDLCALC, TRIG, CHOLHDL, LDLDIRECT in the last 72 hours. Thyroid Function Tests: No results for input(s): TSH, T4TOTAL, FREET4, T3FREE, THYROIDAB in the last 72 hours. Anemia Panel:  Recent Labs  03/20/17 0406 03/20/17 0419  VITAMINB12 6,317*  --   FOLATE  --  19.6  FERRITIN 148  --   TIBC 256  --   IRON 62  --   RETICCTPCT 2.7  --  Urine analysis:    Component Value Date/Time   COLORURINE YELLOW 03/12/2017 [place] 03/12/2017 1349   LABSPEC 1.008 03/12/2017 1349   PHURINE 6.0 03/12/2017 1349   GLUCOSEU NEGATIVE 03/12/2017 1349   HGBUR NEGATIVE 03/12/2017 1349   BILIRUBINUR NEGATIVE 03/12/2017 1349   KETONESUR NEGATIVE 03/12/2017 1349   PROTEINUR 100 (A) 03/12/2017 1349   NITRITE NEGATIVE 03/12/2017 1349   LEUKOCYTESUR TRACE (A) 03/12/2017 1349   Sepsis Labs: @LABRCNTIP (procalcitonin:4,lacticidven:4)  ) Recent Results (from the past 240 hour(s))  MRSA PCR Screening     Status: None   Collection Time: 03/12/17  9:52 AM  Result Value Ref Range Status   MRSA by PCR NEGATIVE NEGATIVE Final    Comment:        The GeneXpert MRSA Assay (FDA approved for NASAL specimens only), is one component of a comprehensive MRSA colonization surveillance program. It is not intended to diagnose MRSA infection nor to guide or monitor treatment for MRSA infections.   MRSA PCR Screening     Status: None   Collection Time: 03/15/17  8:32 PM  Result Value Ref Range Status   MRSA by PCR NEGATIVE NEGATIVE Final    Comment:        The GeneXpert MRSA Assay (FDA approved for NASAL specimens only), is one component of a comprehensive MRSA colonization surveillance program. It is not intended to diagnose MRSA infection nor to guide or monitor treatment for MRSA infections.          Radiology Studies: No results found.      Scheduled  Meds: . amLODipine  10 mg Oral Daily  . aspirin  81 mg Per Tube Daily  . chlorhexidine gluconate (MEDLINE KIT)  15 mL Mouth Rinse BID  . Chlorhexidine Gluconate Cloth  6 each Topical Daily  . docusate  100 mg Per Tube BID  . heparin subcutaneous  5,000 Units Subcutaneous Q8H  . hydrALAZINE  15 mg Intravenous Q6H  . insulin aspart  0-15 Units Subcutaneous Q4H  . levalbuterol  1.25 mg Nebulization TID  . levothyroxine  112 mcg Per Tube QAC breakfast  . mouth rinse  15 mL Mouth Rinse QID  . pantoprazole (PROTONIX) IV  40 mg Intravenous Daily  . sodium chloride flush  10-40 mL Intracatheter Q12H  . sodium chloride flush  3 mL Intravenous Q12H   Continuous Infusions: . sodium chloride Stopped (03/17/17 1595)  . sodium chloride 10 mL/hr at 03/20/17 0700     LOS: 8 days    Time spent: 40 minutes    WOODS, Geraldo Docker, MD [facility] Pager [phone]   If 7PM-7AM, please contact night-coverage [email or web address] Password [facility] 03/20/2017, 9:52 AM

## 2017-03-20 NOTE — Progress Notes (Signed)
  Speech Language Pathology Treatment: Dysphagia  Patient Details Name: Jocelyn Hill MRN: [id number] DOB: Jan 11, 1951 Today's Date: 03/20/2017 Time: 9983-3825 SLP Time Calculation (min) (ACUTE ONLY): 16 min  Assessment / Plan / Recommendation Clinical Impression  Pt with improved respiratory support today; no nausea. Pt exhibited suspected esophageal dysphagia marked by grimace after swallow, touching chest, reports of globus sensation. Prior BSE esophageal dysphagia suspected and SLP recommended outpatient GI consult. Pt with history of GERD however uncertain of esophageal motility. Recent changes in dentition (several teeth fell out) therefore recommend Dys 2, thin liquids, educated pt on reflux strategies. Pharyngeal swallow ability appeared functional. Will continue to follow.     HPI HPI: Jocelyn Hill a 66 y.o.femalewith medical history significant for poorly controlled hypertension, GERD, stage III chronic kidney disease, bronchitis, diabetes mellitus, peripheral neuropathy and hypothyroidism admitted with shortness of breath. Found to have a BNP of 1700 with a new diagnosis of CHF. CXR continued bilateral effusions, right greater the left with bibasilar atelectasis or infiltrates.  Per MD note her chest pressure/ache is described as located midsternally with some radiation in all direction and  feeling like something is stuck in her throat. Pt coded 04-01-2023 and intubated and extubated 9/19. BSE 03/13/17 with suspected esophageal dysphagia (sope with GI 10 yrs ago), recommended to follow up with GI as outpatient, regular and thin recommended. Recent dental extraction (upper).      SLP Plan  Continue with current plan of care       Recommendations  Diet recommendations: Regular;Thin liquid Liquids provided via: Straw;Cup Medication Administration: Whole meds with liquid Supervision: Patient able to self feed Compensations: Slow rate;Small sips/bites;Follow solids with  liquid Postural Changes and/or Swallow Maneuvers: Seated upright 90 degrees;Upright 30-60 min after meal                Oral Care Recommendations: Oral care BID Follow up Recommendations:  (TBD) SLP Visit Diagnosis: Dysphagia, unspecified (R13.10) Plan: Continue with current plan of care       GO                Houston Siren 03/20/2017, 1:28 PM  Jocelyn Hill M.Ed [employer] [phone]

## 2017-03-20 NOTE — Progress Notes (Signed)
Visited with patient this morning who is experiencing some discomfort.  Chaplain provided ministry of presence and prayed with patient.  Chaplain will continue to follow this patient and care for her as needed.  Please page as needed.   [place] [phone] pager [phone] (office)

## 2017-03-20 NOTE — Plan of Care (Signed)
Problem: Physical Regulation: Goal: Ability to maintain clinical measurements within normal limits will improve Outcome: Not Progressing afib RVR; labetalol 2x; brady + long pauses; now NSR; with frequent PVC/PAC  Problem: Activity: Goal: Risk for activity intolerance will decrease Outcome: Progressing Able to sit up on own and attempt to exit bed  Problem: Coping: Goal: Level of anxiety will decrease Outcome: Not Progressing Patient experiencing significant anxiety

## 2017-03-20 NOTE — Progress Notes (Signed)
Progress Note  Patient Name: Jocelyn Hill. [place]ouch Date of Encounter: 03/20/2017  Primary Cardiologist: Bobetta Lime / [place]  team   Subjective   66 yo with hx of chronic severe MR S/p respiratory arrest Has stage IV-V CKD  Received IV lebatolol last night, complicated by bradycardia Received IV atropine     Inpatient Medications    Scheduled Meds: . amLODipine  10 mg Oral Daily  . aspirin  81 mg Per Tube Daily  . chlorhexidine gluconate (MEDLINE KIT)  15 mL Mouth Rinse BID  . Chlorhexidine Gluconate Cloth  6 each Topical Daily  . docusate  100 mg Per Tube BID  . heparin subcutaneous  5,000 Units Subcutaneous Q8H  . hydrALAZINE  15 mg Intravenous Q6H  . insulin aspart  0-15 Units Subcutaneous Q4H  . levalbuterol  1.25 mg Nebulization TID  . levothyroxine  112 mcg Per Tube QAC breakfast  . mouth rinse  15 mL Mouth Rinse QID  . pantoprazole (PROTONIX) IV  40 mg Intravenous Daily  . sodium chloride flush  10-40 mL Intracatheter Q12H  . sodium chloride flush  3 mL Intravenous Q12H   Continuous Infusions: . sodium chloride Stopped (03/17/17 6606)  . sodium chloride 10 mL/hr at 03/20/17 0700   PRN Meds: sodium chloride, acetaminophen **OR** acetaminophen, bisacodyl, hydrALAZINE, ondansetron (ZOFRAN) IV, sennosides, sodium chloride flush, sodium chloride flush   Vital Signs    Vitals:   03/20/17 0700 03/20/17 0739 03/20/17 0859 03/20/17 0941  BP: (!) 162/97   (!) 168/66  Pulse: 84     Resp: 17     Temp:   97.9 F (36.6 C)   TempSrc:   Oral   SpO2: 96% 93%    Weight:      Height:        Intake/Output Summary (Last 24 hours) at 03/20/17 0945 Last data filed at 03/20/17 0800  Gross per 24 hour  Intake              486 ml  Output             1405 ml  Net             -919 ml   Filed Weights   03/17/17 0403 03/18/17 0402 03/19/17 0900  Weight: 147 lb 14.9 oz (67.1 kg) 150 lb 2.1 oz (68.1 kg) 151 lb 10.8 oz (68.8 kg)    Telemetry    NSR with frequent  PVCs  - Personally Reviewed  ECG     NSR  - Personally Reviewed  Physical Exam   GEN: thin, middle age female,  NAD but clearly uncomfortable    Neck: No JVD Cardiac: reg rate with frequent premature beats.  3-0/1 systolic murmur radiating to the axilla  Respiratory: Clear to auscultation bilaterally. GI: Soft, nontender, non-distended  MS: No edema; No deformity. Neuro:  Nonfocal , she is very weak / deconditioned.  Psych: seems depressed  Labs    Chemistry Recent Labs Lab 03/15/17 0222  03/16/17 1403  03/18/17 0346 03/19/17 0316 03/19/17 1930 03/20/17 0418  NA 134*  < > 129*  < > 136 139 136 140  K 4.3  < > 5.4*  < > 4.1 3.9 3.6 3.8  CL 100*  < > 98*  < > 102 108 107 107  CO2 22  < > 18*  < > 23 21* 21* 20*  GLUCOSE 165*  < > 228*  < > 96 98 130* 133*  BUN 37*  < >  63*  < > 66* 56* 54* 55*  CREATININE 3.53*  < > 5.12*  < > 4.47* 3.51* 3.57* 3.56*  CALCIUM 8.7*  < > 7.9*  < > 8.6* 8.4* 8.8* 9.6  PROT 5.7*  --  5.4*  --   --   --   --   --   ALBUMIN 3.0*  < > 2.9*  < > 3.1* 3.0*  --  3.2*  AST 18  --  395*  --   --   --   --   --   ALT 23  --  440*  --   --   --   --   --   ALKPHOS 83  --  81  --   --   --   --   --   BILITOT 0.9  --  1.4*  --   --   --   --   --   GFRNONAA 13*  < > 8*  < > 9* 13* 12* 12*  GFRAA 15*  < > 9*  < > 11* 15* 14* 14*  ANIONGAP 12  < > 13  < > _0 < > = values in this interval not displayed.   Hematology Recent Labs Lab 03/18/17 0346 03/19/17 0316 03/20/17 0406  WBC 10.0 9.2 11.0*  RBC 3.76* 3.64* 3.60*  3.60*  HGB 9.7* 9.4* 9.3*  HCT 28.1* 27.5* 27.4*  MCV 74.7* 75.5* 76.1*  MCH 25.8* 25.8* 25.8*  MCHC 34.5 34.2 33.9  RDW 13.5 13.6 14.0  PLT 226 229 234    Cardiac Enzymes Recent Labs Lab 03/16/17 1403 03/19/17 1334 03/19/17 1930 03/20/17 0100  TROPONINI 0.11* 0.10* 0.11* 0.11*   No results for input(s): TROPIPOC in the last 168 hours.   BNPNo results for input(s): BNP, PROBNP in the last 168 hours.    DDimer No results for input(s): DDIMER in the last 168 hours.   Radiology    No results found.  Cardiac Studies     Patient Profile     66 y.o. female with severe MR   Assessment & Plan    1.   Mitral regurgitation:   She is more awake and alert today .    I discussed the fact that she would likely need to go on dialysis after the cath and almost certainly after a MV repair. She does not want to go on dialysis but reluctantlly agreed.  She needs to start eating if possible  Needs to regain some strength.   At present ,she is so weak that her post op recovery will be very prolonged  Possible cath next week Nephrology will need to be available for dialysis following the cath   For questions or updates, please contact [place] Please consult [email or web address] for contact info under Cardiology/STEMI.      Signed, Mertie Moores, MD  03/20/2017, 9:45 AM

## 2017-03-20 NOTE — Progress Notes (Signed)
Pt w/ 16 beat run of vtach. Asymptomatic.  BP running high. All other vitals wnl. Messaged covering provider.

## 2017-03-21 ENCOUNTER — Inpatient Hospital Stay (HOSPITAL_COMMUNITY): Payer: Medicare Other

## 2017-03-21 DIAGNOSIS — N184 Chronic kidney disease, stage 4 (severe): Secondary | ICD-10-CM

## 2017-03-21 DIAGNOSIS — I340 Nonrheumatic mitral (valve) insufficiency: Secondary | ICD-10-CM

## 2017-03-21 DIAGNOSIS — N189 Chronic kidney disease, unspecified: Secondary | ICD-10-CM

## 2017-03-21 LAB — GLUCOSE, CAPILLARY
Glucose-Capillary: 179 mg/dL — ABNORMAL HIGH (ref 65–99)
Glucose-Capillary: 135 mg/dL — ABNORMAL HIGH (ref 65–99)
Glucose-Capillary: 256 mg/dL — ABNORMAL HIGH (ref 65–99)
Glucose-Capillary: 143 mg/dL — ABNORMAL HIGH (ref 65–99)

## 2017-03-21 LAB — BLOOD GAS, ARTERIAL
Acid-base deficit: 0.5 mmol/L (ref 0.0–2.0)
Bicarbonate: 23.2 mmol/L (ref 20.0–28.0)
Drawn by: 213381
O2 Content: 10 L/min
O2 Saturation: 91.9 %
Patient temperature: 98.3
pCO2 arterial: 34.6 mmHg (ref 32.0–48.0)
pH, Arterial: 7.44 (ref 7.350–7.450)
pO2, Arterial: 62.8 mmHg — ABNORMAL LOW (ref 83.0–108.0)

## 2017-03-21 LAB — COMPREHENSIVE METABOLIC PANEL
ALT: 677 U/L — ABNORMAL HIGH (ref 14–54)
AST: 162 U/L — ABNORMAL HIGH (ref 15–41)
Albumin: 3.2 g/dL — ABNORMAL LOW (ref 3.5–5.0)
Alkaline Phosphatase: 85 U/L (ref 38–126)
Anion gap: 11 (ref 5–15)
BUN: 51 mg/dL — ABNORMAL HIGH (ref 6–20)
CO2: 22 mmol/L (ref 22–32)
Calcium: 9.1 mg/dL (ref 8.9–10.3)
Chloride: 103 mmol/L (ref 101–111)
Creatinine, Ser: 3 mg/dL — ABNORMAL HIGH (ref 0.44–1.00)
GFR calc Af Amer: 18 mL/min — ABNORMAL LOW (ref >60)
GFR calc non Af Amer: 15 mL/min — ABNORMAL LOW (ref >60)
Glucose, Bld: 165 mg/dL — ABNORMAL HIGH (ref 65–99)
Potassium: 3.4 mmol/L — ABNORMAL LOW (ref 3.5–5.1)
Sodium: 136 mmol/L (ref 135–145)
Total Bilirubin: 1.3 mg/dL — ABNORMAL HIGH (ref 0.3–1.2)
Total Protein: 6 g/dL — ABNORMAL LOW (ref 6.5–8.1)

## 2017-03-21 LAB — CBC
HCT: 28.3 % — ABNORMAL LOW (ref 36.0–46.0)
Hemoglobin: 9.5 g/dL — ABNORMAL LOW (ref 12.0–15.0)
MCH: 25.3 pg — ABNORMAL LOW (ref 26.0–34.0)
MCHC: 33.6 g/dL (ref 30.0–36.0)
MCV: 75.3 fL — ABNORMAL LOW (ref 78.0–100.0)
Platelets: 243 10*3/uL (ref 150–400)
RBC: 3.76 MIL/uL — ABNORMAL LOW (ref 3.87–5.11)
RDW: 14.1 % (ref 11.5–15.5)
WBC: 12.1 10*3/uL — ABNORMAL HIGH (ref 4.0–10.5)

## 2017-03-21 LAB — POTASSIUM: Potassium: 3.7 mmol/L (ref 3.5–5.1)

## 2017-03-21 LAB — MAGNESIUM: Magnesium: 2 mg/dL (ref 1.7–2.4)

## 2017-03-21 LAB — PHOSPHORUS: Phosphorus: 3.4 mg/dL (ref 2.5–4.6)

## 2017-03-21 MED ORDER — AMIODARONE LOAD VIA INFUSION
150.0000 mg | Freq: Once | INTRAVENOUS | Status: AC
  Administered 2017-03-21: 150 mg via INTRAVENOUS
  Filled 2017-03-21 (×2): qty 83.34

## 2017-03-21 MED ORDER — AMIODARONE HCL IN DEXTROSE 360-4.14 MG/200ML-% IV SOLN
60.0000 mg/h | INTRAVENOUS | Status: AC
  Administered 2017-03-21: 60 mg/h via INTRAVENOUS
  Filled 2017-03-21 (×3): qty 200

## 2017-03-21 MED ORDER — POTASSIUM CHLORIDE CRYS ER 20 MEQ PO TBCR
60.0000 meq | EXTENDED_RELEASE_TABLET | Freq: Once | ORAL | Status: AC
  Administered 2017-03-21: 60 meq via ORAL
  Filled 2017-03-21: qty 3

## 2017-03-21 MED ORDER — LORAZEPAM 2 MG/ML IJ SOLN
2.0000 mg | Freq: Three times a day (TID) | INTRAMUSCULAR | Status: DC | PRN
  Administered 2017-03-21 – 2017-03-27 (×11): 2 mg via INTRAVENOUS
  Filled 2017-03-21 (×11): qty 1

## 2017-03-21 MED ORDER — FUROSEMIDE 10 MG/ML IJ SOLN
80.0000 mg | Freq: Once | INTRAMUSCULAR | Status: AC
  Administered 2017-03-21: 80 mg via INTRAVENOUS
  Filled 2017-03-21 (×2): qty 8

## 2017-03-21 MED ORDER — AMIODARONE HCL IN DEXTROSE 360-4.14 MG/200ML-% IV SOLN
30.0000 mg/h | INTRAVENOUS | Status: DC
  Administered 2017-03-22 – 2017-03-25 (×7): 30 mg/h via INTRAVENOUS
  Filled 2017-03-21 (×7): qty 200

## 2017-03-21 MED ORDER — MORPHINE SULFATE (PF) 4 MG/ML IV SOLN
2.0000 mg | Freq: Once | INTRAVENOUS | Status: AC
  Administered 2017-03-21: 2 mg via INTRAVENOUS
  Filled 2017-03-21: qty 1

## 2017-03-21 NOTE — Progress Notes (Signed)
Pt in and out of junctional rhythm (40-50s) frequently the past hr.  Messaged attending MD. will continue to monitor closely.

## 2017-03-21 NOTE — Progress Notes (Addendum)
Pts O2 needs went from RA at 1900 last night to now HFNC@ 8L and SpO2 88-90. States SOB. BP still very high. Spoke w/ attending MD and RT. RT at bedside. MD will order CXR. Will continue to monitor BP closely.

## 2017-03-21 NOTE — Progress Notes (Signed)
Pt still in ST w/ 3beat run vtach @11 .Rate lower (100-120).Last BP 156/90. SpO2 92% on 10L.CXR showing increasing vascular congestion/edema. Updated attending MD

## 2017-03-21 NOTE — Progress Notes (Addendum)
Progress Note  Patient Name: Jocelyn Hill. Ruscitti Date of Encounter: 03/21/2017  Primary Cardiologist: Dr. Bettina Gavia  Subjective   Lethargic and not answering questions.  Inpatient Medications    Scheduled Meds: . amLODipine  10 mg Oral Daily  . aspirin  81 mg Per Tube Daily  . chlorhexidine gluconate (MEDLINE KIT)  15 mL Mouth Rinse BID  . docusate sodium  100 mg Oral BID  . heparin subcutaneous  5,000 Units Subcutaneous Q8H  . hydrALAZINE  15 mg Intravenous Q6H  . insulin aspart  0-15 Units Subcutaneous TID WC  . levalbuterol  1.25 mg Nebulization TID  . levothyroxine  112 mcg Per Tube QAC breakfast  . mouth rinse  15 mL Mouth Rinse QID  . pantoprazole (PROTONIX) IV  40 mg Intravenous Daily  . sodium chloride flush  10-40 mL Intracatheter Q12H  . sodium chloride flush  3 mL Intravenous Q12H   Continuous Infusions: . sodium chloride Stopped (03/17/17 3546)   PRN Meds: sodium chloride, acetaminophen **OR** acetaminophen, bisacodyl, hydrALAZINE, LORazepam, ondansetron (ZOFRAN) IV, sennosides, sodium chloride flush, sodium chloride flush   Vital Signs    Vitals:   03/21/17 0454 03/21/17 0746 03/21/17 0800 03/21/17 0855  BP: (!) 153/85 (!) 184/94 (!) 178/89   Pulse: 83 84 77 88  Resp: 20 (!) 23 18 20   Temp: 98.7 F (37.1 C) 98.5 F (36.9 C)    TempSrc: Axillary Oral    SpO2: 93% 92% (!) 88% 91%  Weight:      Height:        Intake/Output Summary (Last 24 hours) at 03/21/17 1140 Last data filed at 03/21/17 0800  Gross per 24 hour  Intake              403 ml  Output              995 ml  Net             -592 ml   Filed Weights   03/18/17 0402 03/19/17 0900 03/21/17 0448  Weight: 68.1 kg (150 lb 2.1 oz) 68.8 kg (151 lb 10.8 oz) 69.1 kg (152 lb 5.4 oz)    Telemetry    Atrial flutter with variable ventricular response.  PVCs. - Personally Reviewed  ECG    Atrial flutter. Rate 130 bpm.  PVCs.   - Personally Reviewed  Physical Exam   GEN: Ill-appearing.  Frail.   Lethargic and not answering questions.  Neck: +JVD Cardiac: Tachycardic.  Irregularly irregular.   III/VI holosystolic murmur at apex. rubs, or gallops.  Respiratory: Bibasilar crackles. GI: Soft, nontender, non-distended  MS: No edema; No deformity. Neuro:  Nonfocal  Psych: Normal affect   Labs    Chemistry Recent Labs Lab 03/15/17 0222  03/16/17 1403  03/19/17 0316 03/19/17 1930 03/20/17 0418 03/21/17 0444  NA 134*  < > 129*  < > 139 136 140 136  K 4.3  < > 5.4*  < > 3.9 3.6 3.8 3.4*  CL 100*  < > 98*  < > 108 107 107 103  CO2 22  < > 18*  < > 21* 21* 20* 22  GLUCOSE 165*  < > 228*  < > 98 130* 133* 165*  BUN 37*  < > 63*  < > 56* 54* 55* 51*  CREATININE 3.53*  < > 5.12*  < > 3.51* 3.57* 3.56* 3.00*  CALCIUM 8.7*  < > 7.9*  < > 8.4* 8.8* 9.6 9.1  PROT 5.7*  --  5.4*  --   --   --   --  6.0*  ALBUMIN 3.0*  < > 2.9*  < > 3.0*  --  3.2* 3.2*  AST 18  --  395*  --   --   --   --  162*  ALT 23  --  440*  --   --   --   --  677*  ALKPHOS 83  --  81  --   --   --   --  85  BILITOT 0.9  --  1.4*  --   --   --   --  1.3*  GFRNONAA 13*  < > 8*  < > 13* 12* 12* 15*  GFRAA 15*  < > 9*  < > 15* 14* 14* 18*  ANIONGAP 12  < > 13  < > 10 8 13 11   < > = values in this interval not displayed.   Hematology Recent Labs Lab 03/19/17 0316 03/20/17 0406 03/21/17 0444  WBC 9.2 11.0* 12.1*  RBC 3.64* 3.60*  3.60* 3.76*  HGB 9.4* 9.3* 9.5*  HCT 27.5* 27.4* 28.3*  MCV 75.5* 76.1* 75.3*  MCH 25.8* 25.8* 25.3*  MCHC 34.2 33.9 33.6  RDW 13.6 14.0 14.1  PLT 229 234 243    Cardiac Enzymes Recent Labs Lab 03/16/17 1403 03/19/17 1334 03/19/17 1930 03/20/17 0100  TROPONINI 0.11* 0.10* 0.11* 0.11*   No results for input(s): TROPIPOC in the last 168 hours.   BNPNo results for input(s): BNP, PROBNP in the last 168 hours.   DDimer No results for input(s): DDIMER in the last 168 hours.   Radiology    Dg Chest Port 1 View  Result Date: 03/21/2017 CLINICAL DATA:  Shortness of  breath. Concern for pulmonary edema. History of diabetes and hypertension. EXAM: PORTABLE CHEST 1 VIEW COMPARISON:  Chest x-rays dated 03/18/2017 and 03/17/2017. FINDINGS: Increasing central pulmonary vascular congestion and perihilar interstitial edema. Bibasilar opacities, some component likely related to associated pulmonary edema. Suspect additional atelectasis and/or small pleural effusions at each lung base. No pneumothorax seen. Stable cardiomegaly. Atherosclerotic changes noted at the aortic arch. Endotracheal tube and enteric tube have been removed. IMPRESSION: 1. Increasing central pulmonary vascular congestion and perihilar edema indicating CHF/volume overload. 2. Bibasilar opacities are not significantly changed, most likely a combination of atelectasis and small pleural effusions. Suspect some degree of superimposed pulmonary edema. Additional underlying pneumonia cannot be excluded if febrile. 3. Stable cardiomegaly. 4. Aortic atherosclerosis. Electronically Signed   By: Franki Cabot M.D.   On: 03/21/2017 09:20    Cardiac Studies   TEE 03/17/17: Study Conclusions  - Left ventricle: Systolic function was normal. The estimated   ejection fraction was in the range of 55% to 60%. Wall motion was   normal; there were no regional wall motion abnormalities. - Aortic valve: No evidence of vegetation. There was trivial   regurgitation. - Mitral valve: Mobility of the anterior and posterior leaflet was   severely restricted. There was severe regurgitation. - Left atrium: The atrium was mildly dilated. No evidence of   thrombus in the atrial cavity or appendage. - Right atrium: No evidence of thrombus in the atrial cavity or   appendage. - Atrial septum: No defect or patent foramen ovale was identified. - Tricuspid valve: No evidence of vegetation. There was   mild-moderate regurgitation. - Pulmonic valve: No evidence of vegetation.  Impressions:  - Normal LV systolic function; mild  LAE; restricted anterior and  posterior MV leaflets with severe MR; mild to moderate TR.  Patient Profile     66 y.o. female with severe mitral regurgitation, hypertenion, hypothyroidism, diabetes, and CKD III-IV admitted with hypoxic respiratory failure and found to have severe MR and acute on chronic diastolic heart failure.  Assessment & Plan    # Severe mitral regurgitation: Needs operative repair but not medically stable.  She is also very frail and deconditioned.  Her heart rate must be stabilized or she will not do well.   # Paroxysmal atrial fibrillation/flutter:  Ms. Lobue is back in atrial flutter this AM.  Rate 130 bpm. She had bradycardia with labetalol, so will avoid beta blocker. Will try IV amiodarone, although it also has some beta blocker affect.  If she gets bradycardic she will need a PPM.    # Elevated troponin: Cath next week. Likely needs nephrology assistance.  # Bradycardia: Occurred after receiving IV labetalol.   # Hypertension: Patient has a history of poorly-controlled hypertension and was non-compliant with home meds.  BP remains above goal.  Given that we are going to try amiodarone and she has known issues with bradycardic, will wait before increasing her hypertensive regimen until we known her HR will be OK.   # Respiratory arrest:  # Hypoxic respiratory failure: Now extubated but requiring more oxygen.  Increased edema on exam and chest xray.  Will give lasix 7m IV x1.   Time spent: 60 minutes-Greater than 50% of this time was spent in counseling, explanation of diagnosis, planning of further management, and coordination of care.   For questions or updates, please contact [place] [email or web address] for contact info under Cardiology/STEMI.      Signed, TSkeet Latch MD  03/21/2017, 11:40 AM

## 2017-03-21 NOTE — Consult Note (Signed)
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  Patient's detailed medical history remains somewhat unclear but according to the family (3 daughters) the patient has long-standing history of hypertension, type 2 diabetes mellitus, and at least 2 previous myocardial infarctions for which she was treated at [facility] in [place] several years ago. She reportedly also suffered at least one stroke approximately 2 years ago. The patient has known history of a heart murmur but there is no reported history of rheumatic fever. According to the patient's family she was in her usual state of health until approximately 3 or 4 weeks ago. Prior to that she was living alone and functionally independent. She is not very active physically but drives an automobile and takes care of herself. Over several weeks the patient developed progressive shortness of breath until she ultimately presented to [facility] in acute respiratory distress on 03/12/2017.  At the time she was noted to be in uncontrolled hypertension with no  reported symptoms of chest pain.  Chest x-ray revealed pulmonary edema and BNP level was severely elevated. She was transferred to [facility] for admission where she was evaluated by Dr. Harl Bowie and Dr. Jonnie Finner.  Transthoracic echocardiogram revealed normal left ventricular systolic function and severe mitral regurgitation.  She was diuresed aggressively and responded initially, but she subsequently deteriorated with acute respiratory failure requiring intubation for mechanical ventilation on 03/16/2017.  Transesophageal echocardiogram was performed confirming the presence of severe mitral regurgitation with functional anatomy consistent with likely rheumatic heart disease. Cardiothoracic surgical consultation was requested.  The patient's clinical condition stabilized to some degree and she was eventually weaned and extubated from the ventilator. Renal function stabilized after serum creatinine peaked at 5.0 and then gradually drifted down to 3.0.  She was transferred to stepdown but went into rapid atrial fibrillation earlier today.  The patient currently remains intermittently very lethargic. Her mental status waxes and wanes, although at present she is alert and conversive but dozes off intermittently. Her family is at the bedside and reports that this is quite unusual for her. She admits to symptoms of severe resting shortness of breath and orthopnea. She has not had chest pain or chest tightness. She has not had dizzy spells or syncope. Appetite is marginal. She is very weak and cannot stand without assistance. She denies any fevers chills or productive cough.  According to the patient's family she is divorced and lives alone but have remained functionally independent. She has several adult children who are supportive and lives close by. Prior to admission she drives an automobile intended to her own basic needs. She admits to some degree of exertional shortness  of breath. She apparently  has history of smoking marijuana. She does not use other illicit drugs. She has history of diabetes but does not check her blood sugars regularly at home. She has poor dentition with several loose teeth and teeth that of an out. She has not seen a dentist in many years.  Prior to admission she walks without need for mechanical assistance but admits to unstable gait due to poor balance. There is no history of previous falls.  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   Past Medical History:  Diagnosis Date  . Anxiety   . Arthritis   . Bronchitis   . Depression   . Diabetes mellitus without complication ([place])   . Diet-controlled diabetes mellitus ([place])   . GERD (gastroesophageal reflux disease)   . Hyperlipidemia   . Hypertension   . Hypothyroidism   . Stroke [facility])     Past Surgical History:  Procedure Laterality Date  . ABDOMINAL HYSTERECTOMY     PARTIALS  . TONSILLECTOMY    . TUBAL LIGATION      History reviewed. No pertinent family history.  Social History   Social History  . Marital status: Divorced    Spouse name: N/A  . Number of children: N/A  . Years of education: N/A   Occupational History  . Not on file.   Social History Main Topics  . Smoking status: Current Some Day Smoker    Types: Cigarettes  . Smokeless tobacco: Never Used  . Alcohol use No  . Drug use: No  . Sexual activity: No   Other Topics Concern  . Not on file   Social History Narrative  . No narrative on file    Prior to Admission medications   Medication Sig Start Date End Date Taking? Authorizing Provider  albuterol (PROVENTIL HFA;VENTOLIN HFA) 108 (90 Base) MCG/ACT inhaler Inhale 2 puffs into the lungs every 4 (four) hours as needed for wheezing or shortness of breath.   Yes [provider]  amLODipine (NORVASC) 10 MG tablet Take 10 mg by mouth daily.   Yes [provider]  aspirin EC 81 MG tablet Take 81 mg by  mouth daily.   Yes [provider]  atorvastatin (LIPITOR) 40 MG tablet Take 40 mg by mouth daily.   Yes [provider]  b complex vitamins capsule Take 1 capsule by mouth daily.   Yes [provider]  buPROPion (WELLBUTRIN XL) 300 MG 24 hr tablet Take 300 mg by mouth daily.   Yes [provider]  cholecalciferol (VITAMIN D) 1000 units tablet Take 1,000 Units by mouth daily.   Yes [provider]  DULoxetine (CYMBALTA) 30 MG capsule Take 30 mg by mouth daily.   Yes [provider]  gabapentin (NEURONTIN) 300 MG capsule Take 300 mg by mouth at bedtime.   Yes [provider]  hydrALAZINE (APRESOLINE) 100 MG tablet Take 100 mg by mouth 3 (three) times daily.   Yes [provider]  labetalol (NORMODYNE) 200 MG tablet Take 200 mg by mouth 2 (two) times daily.   Yes [provider]  levothyroxine (SYNTHROID, LEVOTHROID) 112 MCG tablet Take 112 mcg by mouth daily before breakfast.   Yes [provider]  minoxidil (LONITEN) 2.5 MG tablet Take by mouth daily.   Yes [provider]  montelukast (SINGULAIR) 10 MG tablet Take 10 mg by mouth daily.   Yes [provider]  Oxycodone HCl 10 MG TABS Take 10 mg by mouth 3 (three) times daily as needed (pain).   Yes [provider]  pantoprazole (PROTONIX) 40 MG tablet Take 40 mg by mouth daily.   Yes [provider]  potassium gluconate (HM POTASSIUM) 595 (99 K) MG TABS tablet Take 595 mg by mouth daily.   Yes [provider]  pregabalin (LYRICA) 150 MG capsule Take 150 mg by mouth daily.   Yes [provider]  tiZANidine (ZANAFLEX) 4 MG capsule Take 4 mg by mouth 3 (three) times daily.   Yes [provider]  zolpidem (AMBIEN) 10 MG tablet Take 10 mg by mouth at bedtime as needed for sleep.   Yes [provider]  busPIRone (BUSPAR) 15 MG tablet Take 15 mg by mouth 2 (two) times daily.    [provider]    Current Facility-Administered Medications  Medication Dose Route Frequency Provider Last Rate Last Dose  . 0.9 %  sodium chloride infusion  250 mL Intravenous PRN Annita Brod, MD   Stopped at 03/17/17 [phone]  . acetaminophen (TYLENOL) tablet 650 mg  650 mg Oral Q6H PRN Annita Brod, MD   650 mg at 03/15/17 9937   Or  . acetaminophen (TYLENOL) suppository 650 mg  650 mg Rectal Q6H PRN Annita Brod, MD   650 mg at 03/19/17 2114  . amiodarone (NEXTERONE PREMIX) 360-4.14 MG/200ML-% (1.8 mg/mL) IV infusion  60 mg/hr Intravenous Continuous Skeet Latch, MD       Followed by  . amiodarone (NEXTERONE PREMIX) 360-4.14 MG/200ML-% (1.8 mg/mL) IV infusion  30 mg/hr Intravenous Continuous Skeet Latch, MD      . amLODipine (NORVASC) tablet 10 mg  10 mg Oral Daily Desai, Rahul P, PA-C   10 mg at 03/21/17 0854  . aspirin chewable tablet 81 mg  81 mg Per Tube Daily Desai, Rahul P, PA-C   81 mg at 03/21/17 0853  . bisacodyl (DULCOLAX) suppository 10 mg  10 mg Rectal Daily PRN Jennelle Human B, NP      . chlorhexidine gluconate (MEDLINE KIT) (PERIDEX) 0.12 % solution 15 mL  15 mL Mouth Rinse BID Hammonds, Sharyn Blitz, MD   15 mL at 03/21/17 0914  . docusate sodium (COLACE) capsule 100 mg  100 mg Oral BID [place] A, NP   100 mg at 03/21/17 0854  . heparin injection 5,000 Units  5,000 Units Subcutaneous Q8H Desai, Rahul P, PA-C   5,000 Units at 03/21/17 1342  . hydrALAZINE (APRESOLINE) injection 10-40 mg  10-40 mg Intravenous Q4H PRN Shearon Stalls, Rahul P, PA-C   20 mg at 03/20/17 2312  . hydrALAZINE (APRESOLINE) injection 15 mg  15 mg Intravenous Q6H Allie Bossier, MD   15 mg at 03/21/17 0855  . insulin aspart (novoLOG) injection 0-15 Units  0-15 Units Subcutaneous TID WC Allie Bossier, MD   2 Units at 03/21/17 1219  . levalbuterol (XOPENEX) nebulizer solution 1.25 mg  1.25 mg Nebulization TID Allie Bossier, MD   1.25 mg at 03/21/17 1696  . levothyroxine (SYNTHROID,  LEVOTHROID) tablet 112 mcg  112 mcg Per Tube QAC breakfast Shearon Stalls, Rahul P, PA-C   112 mcg at 03/21/17 0854  . LORazepam (ATIVAN) injection 2 mg  2 mg Intravenous Q8H PRN Allie Bossier, MD   2 mg at 03/21/17 0931  . MEDLINE mouth rinse  15 mL Mouth Rinse QID Hammonds, Sharyn Blitz, MD   15 mL at 03/21/17 1207  .  ondansetron (ZOFRAN) injection 4 mg  4 mg Intravenous Q6H PRN Desai, Rahul P, PA-C   4 mg at 03/21/17 0446  . pantoprazole (PROTONIX) injection 40 mg  40 mg Intravenous Daily Jennelle Human B, NP   40 mg at 03/21/17 0857  . sennosides (SENOKOT) 8.8 MG/5ML syrup 5 mL  5 mL Per Tube BID PRN Hammonds, Sharyn Blitz, MD   5 mL at 03/19/17 0837  . sodium chloride flush (NS) 0.9 % injection 10-40 mL  10-40 mL Intracatheter Q12H Hammonds, Sharyn Blitz, MD   10 mL at 03/21/17 0912  . sodium chloride flush (NS) 0.9 % injection 10-40 mL  10-40 mL Intracatheter PRN Hammonds, Sharyn Blitz, MD      . sodium chloride flush (NS) 0.9 % injection 3 mL  3 mL Intravenous Q12H Annita Brod, MD   3 mL at 03/21/17 0912  . sodium chloride flush (NS) 0.9 % injection 3 mL  3 mL Intravenous PRN Annita Brod, MD   3 mL at 03/20/17 0949    Allergies  Allergen Reactions  . Ace Inhibitors Anaphylaxis and Swelling      Review of Systems:   General:  poor appetite, decreased energy, no weight gain, + weight loss, no fever  Cardiac:  no chest pain with exertion, no chest pain at rest, + SOB with exertion, + resting SOB, + PND, + orthopnea, + palpitations, + arrhythmia, + atrial fibrillation, + LE edema, no dizzy spells, no syncope  Respiratory:  + shortness of breath, no home oxygen, no productive cough, no dry cough, no bronchitis, no wheezing, no hemoptysis, no asthma, no pain with inspiration or cough, no sleep apnea, no CPAP at night  GI:   no difficulty swallowing, no reflux, no frequent heartburn, no hiatal hernia, no abdominal pain, no constipation, no diarrhea, no hematochezia, no hematemesis, no  melena  GU:   no dysuria,  no frequency, no urinary tract infection, no hematuria, no kidney stones, + kidney disease  Vascular:  no pain suggestive of claudication, no pain in feet, no leg cramps, no varicose veins, no DVT, no non-healing foot ulcer  Neuro:   + stroke, no TIA's, no seizures, no headaches, no temporary blindness one eye,  no slurred speech, no peripheral neuropathy, no chronic pain, + instability of gait, + memory/cognitive dysfunction  Musculoskeletal: + arthritis - primarily involving the lower back, no joint swelling, no myalgias, + difficulty walking, decreased mobility   Skin:   no rash, no itching, no skin infections, no pressure sores or ulcerations  Psych:   + anxiety, + depression, no nervousness, no unusual recent stress  Eyes:   + blurry vision, no floaters, no recent vision changes, does not wears glasses or contacts  ENT:   no hearing loss, + loose or painful teeth, no dentures, last saw dentist many years ago  Hematologic:  no easy bruising, no abnormal bleeding, no clotting disorder, no frequent epistaxis  Endocrine:  + diabetes, does not check regularly CBG's at home     Physical Exam:   BP (!) 160/114   Pulse 65   Temp 98.3 F (36.8 C) (Oral)   Resp (!) 25   Ht 5' 9" (1.753 m)   Wt 152 lb 5.4 oz (69.1 kg)   SpO2 92%   BMI 22.50 kg/m   General:  Thin, frail-appearing  HEENT:  Unremarkable   Neck:   no JVD, no bruits, no adenopathy   Chest:   Diminished at bases, inspiratory crackles  CV:   Irregular rate and rhythm, IV/VI holosystolic murmur   Abdomen:  soft, non-tender, no masses   Extremities:  warm, well-perfused, pulses diminished, trace lower extremity edema  Rectal/GU  Deferred  Neuro:   Grossly non-focal and symmetrical throughout  Skin:   Clean and dry, no rashes, no breakdown  Diagnostic Tests:  Transthoracic Echocardiography  Patient:    Naylah, Cork MR #:       [id number] Study Date: 03/12/2017 Gender:     F Age:         84 Height:     157.5 cm Weight:     60.3 kg BSA:        1.64 m^2 Pt. Status: Room:       [place], Sendil K  ATTENDING    [place], Sendil K  ORDERING     [employer], Sendil K  REFERRING    Gevena Barre K  SONOGRAPHER  Donata Clay  PERFORMING   [facility], Inpatient  cc:  ------------------------------------------------------------------- LV EF: 60% -   65%  ------------------------------------------------------------------- Indications:      CHF - 428.0.  ------------------------------------------------------------------- History:   PMH:  CKD stage III. SOB. Respiratory distress.  Risk factors:  Hypertension.  ------------------------------------------------------------------- Study Conclusions  - Left ventricle: The cavity size was normal. Wall thickness was   normal. Systolic function was normal. The estimated ejection   fraction was in the range of 60% to 65%. Wall motion was normal;   there were no regional wall motion abnormalities. Features are   consistent with a pseudonormal left ventricular filling pattern,   with concomitant abnormal relaxation and increased filling   pressure (grade 2 diastolic dysfunction). - Mitral valve: There was severe regurgitation. - Left atrium: The atrium was severely dilated. - Right atrium: The atrium was mildly dilated. - Tricuspid valve: There was mild-moderate regurgitation. - Pulmonary arteries: Systolic pressure was moderately increased.   PA peak pressure: 60 mm Hg (S). - Pericardium, extracardiac: A small to moderate pericardial   effusion was identified. There was no evidence of hemodynamic   compromise.  ------------------------------------------------------------------- Study data:  No prior study was available for comparison.  Study status:  Routine.  Procedure:  The patient reported no pain pre or post test. Transthoracic echocardiography. Image quality was adequate.  Study completion:   There were no complications. Transthoracic echocardiography.  M-mode, complete 2D, spectral Doppler, and color Doppler.  Birthdate:  Patient birthdate: [birth year].  Age:  Patient is 66 yr old.  Sex:  Gender: female. BMI: 24.3 kg/m^2.  Blood pressure:     154/80  Patient status: Inpatient.  Study date:  Study date: 03/12/2017. Study time: 11:55 AM.  Location:  ICU/CCU  -------------------------------------------------------------------  ------------------------------------------------------------------- Left ventricle:  The cavity size was normal. Wall thickness was normal. Systolic function was normal. The estimated ejection fraction was in the range of 60% to 65%. Wall motion was normal; there were no regional wall motion abnormalities. Features are consistent with a pseudonormal left ventricular filling pattern, with concomitant abnormal relaxation and increased filling pressure (grade 2 diastolic dysfunction).  ------------------------------------------------------------------- Aortic valve:   Structurally normal valve.   Cusp separation was normal.  Doppler:  Transvalvular velocity was within the normal range. There was no stenosis. There was no regurgitation.  ------------------------------------------------------------------- Aorta:  Aortic root: The aortic root was normal in size. Ascending aorta: The ascending aorta was normal in size.  ------------------------------------------------------------------- Mitral valve:   Structurally normal valve.   Leaflet separation was  normal.  Doppler:  Transvalvular velocity was within the normal range. There was no evidence for stenosis. There was severe regurgitation.    Peak gradient (D): 14 mm Hg.  ------------------------------------------------------------------- Left atrium:  The atrium was severely dilated.  ------------------------------------------------------------------- Right ventricle:  The cavity size was normal.  Systolic function was normal.  ------------------------------------------------------------------- Pulmonic valve:    The valve appears to be grossly normal. Doppler:  There was no significant regurgitation.  ------------------------------------------------------------------- Tricuspid valve:   The valve appears to be grossly normal. Doppler:  There was mild-moderate regurgitation.  ------------------------------------------------------------------- Pulmonary artery:   Systolic pressure was moderately increased.  ------------------------------------------------------------------- Right atrium:  The atrium was mildly dilated.  ------------------------------------------------------------------- Pericardium:  A small to moderate pericardial effusion was identified. There was no evidence of hemodynamic compromise.  ------------------------------------------------------------------- Measurements   Left ventricle                           Value        Reference  LV ID, ED, PLAX chordal          (H)     54.4  mm     43 - 52  LV ID, ES, PLAX chordal          (H)     43.9  mm     23 - 38  LV fx shortening, PLAX chordal   (L)     19    %      >=29  LV PW thickness, ED                      12.1  mm     ----------  IVS/LV PW ratio, ED                      1.01         <=1.3  Stroke volume, 2D                        65    ml     ----------  Stroke volume/bsa, 2D                    40    ml/m^2 ----------  LV ejection fraction, 1-p A4C            64    %      ----------  LV end-diastolic volume, 2-p             210   ml     ----------  LV end-systolic volume, 2-p              84    ml     ----------  LV ejection fraction, 2-p                60    %      ----------  Stroke volume, 2-p                       126   ml     ----------  LV end-diastolic volume/bsa, 2-p         128   ml/m^2 ----------  LV end-systolic volume/bsa, 2-p          51    ml/m^2 ----------  Stroke volume/bsa, 2-p  77.1  ml/m^2 ----------  LV e&', lateral                           5.68  cm/s   ----------  LV E/e&', lateral                         32.57        ----------  LV e&', medial                            5.52  cm/s   ----------  LV E/e&', medial                          33.51        ----------  LV e&', average                           5.6   cm/s   ----------  LV E/e&', average                         33.04        ----------    Ventricular septum                       Value        Reference  IVS thickness, ED                        12.2  mm     ----------    LVOT                                     Value        Reference  LVOT ID, S                               20    mm     ----------  LVOT area                                3.14  cm^2   ----------  LVOT peak velocity, S                    107   cm/s   ----------  LVOT mean velocity, S                    64.4  cm/s   ----------  LVOT VTI, S                              20.6  cm     ----------    Aorta                                    Value        Reference  Aortic root ID, ED  30    mm     ----------    Left atrium                              Value        Reference  LA ID, A-P, ES                           43    mm     ----------  LA ID/bsa, A-P                   (H)     2.63  cm/m^2 <=2.2  LA volume, S                             143   ml     ----------  LA volume/bsa, S                         87.5  ml/m^2 ----------  LA volume, ES, 1-p A4C                   137   ml     ----------  LA volume/bsa, ES, 1-p A4C               83.8  ml/m^2 ----------  LA volume, ES, 1-p A2C                   148   ml     ----------  LA volume/bsa, ES, 1-p A2C               90.5  ml/m^2 ----------    Mitral valve                             Value        Reference  Mitral E-wave peak velocity              185   cm/s   ----------  Mitral A-wave peak velocity              170   cm/s   ----------  Mitral deceleration  time         (H)     324   ms     150 - 230  Mitral peak gradient, D                  14    mm Hg  ----------  Mitral E/A ratio, peak                   1.1          ----------  Mitral regurg VTI, PISA                  250   cm     ----------  Mitral ERO, PISA                         0.26  cm^2   ----------  Mitral regurg volume, PISA               65    ml     ----------    Pulmonary arteries  Value        Reference  PA pressure, S, DP               (H)     60    mm Hg  <=30    Tricuspid valve                          Value        Reference  Tricuspid regurg peak velocity           334   cm/s   ----------  Tricuspid peak RV-RA gradient            45    mm Hg  ----------    Right atrium                             Value        Reference  RA ID, S-I, ES, A4C              (H)     62    mm     34 - 49  RA area, ES, A4C                 (H)     22.9  cm^2   8.3 - 19.5  RA volume, ES, A/L                       65.5  ml     ----------  RA volume/bsa, ES, A/L                   40.1  ml/m^2 ----------    Systemic veins                           Value        Reference  Estimated CVP                            15    mm Hg  ----------    Right ventricle                          Value        Reference  RV ID, minor axis, ED, A4C base          35.2  mm     ----------  TAPSE                                    25.9  mm     ----------  RV pressure, S, DP               (H)     60    mm Hg  <=30  RV s&', lateral, S                        15.6  cm/s   ----------  Legend: (L)  and  (H)  mark values outside specified reference range.  ------------------------------------------------------------------- Prepared and Electronically Authenticated by  Mertie Moores, M.D. 2018-09-13T15:29:08     Transesophageal Echocardiography  Patient:    [name]. MR #:  [id number] Study Date: 03/17/2017 Gender:     F Age:        34 Height: Weight: BSA: Pt. Status: Room:        [place]  ADMITTING    Gevena Barre K  SONOGRAPHER  [place], Tami Lin  REFERRING    [employer], Tami Lin  ATTENDING    Hammonds, Nunzio Cory H  cc:  ------------------------------------------------------------------- LV EF: 55% -   60%  ------------------------------------------------------------------- Indications:      Mitral regurgitation 424.0.  ------------------------------------------------------------------- History:   Risk factors:  Hypertension. Diabetes mellitus. Dyslipidemia.  ------------------------------------------------------------------- Study Conclusions  - Left ventricle: Systolic function was normal. The estimated   ejection fraction was in the range of 55% to 60%. Wall motion was   normal; there were no regional wall motion abnormalities. - Aortic valve: No evidence of vegetation. There was trivial   regurgitation. - Mitral valve: Mobility of the anterior and posterior leaflet was   severely restricted. There was severe regurgitation. - Left atrium: The atrium was mildly dilated. No evidence of   thrombus in the atrial cavity or appendage. - Right atrium: No evidence of thrombus in the atrial cavity or   appendage. - Atrial septum: No defect or patent foramen ovale was identified. - Tricuspid valve: No evidence of vegetation. There was   mild-moderate regurgitation. - Pulmonic valve: No evidence of vegetation.  Impressions:  - Normal LV systolic function; mild LAE; restricted anterior and   posterior MV leaflets with severe MR; mild to moderate TR.  ------------------------------------------------------------------- Study data:   Study status:  Routine.  Consent:  The risks, benefits, and alternatives to the procedure were explained to the patient and informed consent was obtained.  Procedure:  Initial setup. The patient was brought to the laboratory. Surface ECG  leads were monitored. Sedation. Conscious sedation was administered. Transesophageal echocardiography. Topical anesthesia was obtained using viscous lidocaine. An adult multiplane transesophageal probe was inserted by the attending cardiologist. Image quality was adequate.  Study completion:  The patient tolerated the procedure well. There were no complications.  Administered medications: Fentanyl, 45mg, IV.  Midazolam, 411m IV.          Diagnostic transesophageal echocardiography.  2D and color Doppler. Birthdate:  Patient birthdate: 01Jul 13, 1952 Age:  Patient is [age]r old.  Sex:  Gender: female.  Blood pressure:     164/87  Patient status:  Inpatient.  Study date:  Study date: 03/17/2017. Study time: 09:50 AM.  Location:  Bedside.  -------------------------------------------------------------------  ------------------------------------------------------------------- Left ventricle:  Systolic function was normal. The estimated ejection fraction was in the range of 55% to 60%. Wall motion was normal; there were no regional wall motion abnormalities.  ------------------------------------------------------------------- Aortic valve:   Mildly thickened leaflets. Cusp separation was normal.  No evidence of vegetation.  Doppler:  There was trivial regurgitation.  ------------------------------------------------------------------- Aorta:  Descending aorta: The descending aorta had moderate diffuse disease.  ------------------------------------------------------------------- Mitral valve:   Moderately thickened leaflets . Mobility of the anterior and posterior leaflet was severely restricted.  Doppler: There was severe regurgitation.  ------------------------------------------------------------------- Left atrium:  The atrium was mildly dilated.  No evidence of thrombus in the atrial cavity or  appendage.  ------------------------------------------------------------------- Atrial septum:  No defect or patent foramen ovale was identified.   ------------------------------------------------------------------- Right ventricle:  The cavity size was normal. Systolic function was normal.  ------------------------------------------------------------------- Pulmonic valve:    Structurally normal valve.   Cusp  separation was normal.  No evidence of vegetation.  Doppler:  There was trivial regurgitation.  ------------------------------------------------------------------- Tricuspid valve:   Structurally normal valve.   Leaflet separation was normal.  No evidence of vegetation.  Doppler:  There was mild-moderate regurgitation.  ------------------------------------------------------------------- Right atrium:  The atrium was normal in size.  No evidence of thrombus in the atrial cavity or appendage.  ------------------------------------------------------------------- Pericardium:  There was no pericardial effusion.   ------------------------------------------------------------------- Prepared and Electronically Authenticated by  Kirk Ruths 2018-09-18T16:45:08    PORTABLE CHEST 1 VIEW  COMPARISON:  Chest x-rays dated 03/18/2017 and 03/17/2017.  FINDINGS: Increasing central pulmonary vascular congestion and perihilar interstitial edema. Bibasilar opacities, some component likely related to associated pulmonary edema. Suspect additional atelectasis and/or small pleural effusions at each lung base. No pneumothorax seen.  Stable cardiomegaly. Atherosclerotic changes noted at the aortic arch. Endotracheal tube and enteric tube have been removed.  IMPRESSION: 1. Increasing central pulmonary vascular congestion and perihilar edema indicating CHF/volume overload. 2. Bibasilar opacities are not significantly changed, most likely a combination of atelectasis and  small pleural effusions. Suspect some degree of superimposed pulmonary edema. Additional underlying pneumonia cannot be excluded if febrile. 3. Stable cardiomegaly. 4. Aortic atherosclerosis.   Electronically Signed   By: Franki Cabot M.D.   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 She was originally admitted to the hospital more than 1 week ago with acute exacerbation of chronic diastolic congestive heart failure complicated by the presence of severe chronic kidney disease and long-standing hypertension. The patient also reportedly has history of coronary artery disease with more than one previous myocardial infarction in the distant past.  I have personally reviewed the patient's transthoracic and transesophageal echocardiograms. Findings are consistent with rheumatic heart disease. The mitral valve has severely restricted leaflet mobility throughout the cardiac cycle with thickening and some degree of calcification of both the anterior and posterior leaflets. There is severe thickening and foreshortening of the subvalvular apparatus. Left ventricular systolic function appears reasonably well preserved although this is in the setting of quite severe mitral regurgitation.  There were no obvious regional wall motion abnormalities. Ultimately the patient would benefit from elective mitral valve replacement. However, the patient's mitral regurgitation is clearly chronic and there are no indications for urgent nor emergent surgical intervention.  I would not consider this patient a candidate for surgery at the present time due to her decompensated congestive heart failure with ongoing pulmonary edema, severe deconditioning, and abnormal mental status.  Given the patient's relatively young age and the history provided by her family, I remain optimistic that with aggressive medical care  she could potentially improve from her current acute illness and ultimately undergo elective surgical intervention.  It is unclear whether or not the patient could improve to the point where it would be reasonable to consider surgical intervention without initiation of renal replacement therapy.   Recommendations:  I recommend more aggressive treatment of the patient's acute exacerbation of congestive heart failure and atrial fibrillation. She may ultimately need initiation of hemodialysis, and if so I would favor proceeding with placement of a tunneled dialysis catheter.   At some point she will need to undergo diagnostic cardiac catheterization. In addition, she will eventually need dental service consultation and likely dental extraction.  I discussed matters at length with the patient and her family at the bedside. The patient's mental status waxed and waned with periods  of increased lethargy during our conversation, suggesting the presence of residual significant metabolic encephalopathy which may or may not be related to underlying renal failure. The patient's family is quite engaged and provided useful pertinent history. All questions answered. We will follow periodically but please call specific questions arise.   I spent in excess of 120 minutes during the conduct of this hospital consultation and >50% of this time involved direct face-to-face encounter for counseling and/or coordination of the patient's care.    Valentina Gu. Roxy Manns, MD 03/21/2017 3:12 PM

## 2017-03-21 NOTE — Progress Notes (Addendum)
PROGRESS NOTE    Jocelyn Hill. Jocelyn Hill  IPJ:[id number] DOB: Dec 26, 1950 DOA: 03/12/2017 PCP: [name], MD   Brief Narrative:  66 y.o. BF PMHx Anxiety, Depression, CVA, poorly controlled HTN, CKD stage III, DM type II with Peripheral Neuropathy, Hypothyroidism    Who states that over the past month, she is noted she has become more winded. Then starting on Saturday, 9/8, patient cut the point where she felt like she is having a lot of trouble breathing. The symptoms continued to progress to the point where she could not take it anymore and she came in to the emergency room at [facility] for further evaluation. Patient found to have a BNP of 1700 with a new diagnosis of CHF. Chest x-ray noted moderate bilateral pleural effusions. Patient was given Lasix and put on nitroglycerin patch. Blood pressure systolic in the 734L-937T. Patient initially BiPAP to keep oxygen saturations greater than 90%. In addition, creatinine noted to be 3.2 or baseline was 2.1. Because of worsening creatinine and decompensated CHF, it was felt best to transfer this patient to [facility] Hospitalists accepted transfer.    Subjective: 9/22 A/O 4, negative SOB, negative N/V, negative abdominal pain. Positive continued chest pressure no radiation     Assessment & Plan:   Principal Problem:   Acute on chronic respiratory failure with hypoxia (HCC) Active Problems:   Hypertension   Hypothyroidism   Diet-controlled diabetes mellitus (HCC)   Acute diastolic (congestive) heart failure (HCC)   AKI (acute kidney injury) (HCC)   CKD (chronic kidney disease), stage III   CHF (congestive heart failure) (HCC)   Dysphagia   Malignant hypertension   Microcytic anemia   Respiratory failure with Hypoxia/COPD exacerbation? -Currently on 10 L O2 via HFNC -Patient with good air movement/negative wheezing do not believe COPD exacerbation -Xopenex TID.    Substance abuse -UDS positive for benzodiazepine, opiates,  marijuana. Unable to differentiate if positive prior to hospitalization for Benzodiazepine/Opiates.   Acute Diastolic CHF/severe MR -Strict in and out since admission -6.1 L -Daily weight Filed Weights   03/18/17 0402 03/19/17 0900 03/21/17 0448  Weight: 150 lb 2.1 oz (68.1 kg) 151 lb 10.8 oz (68.8 kg) 152 lb 5.4 oz (69.1 kg)  -Discontinued all nodal blocking agent -Amlodipine 10 mg daily -Furosemide 80 mg 1  -Hydralazine 15 mg QID -Hydralazine PRN -Monitor closely for bradycardia. Evening of 9/20 patient with episodes of bradycardia/asystole pauses see addendum #2  -NCM Roque Cash working on seeing a). If dental services available next week. b). No dental services available next week how to transport patient into town to have teeth evaluated/removed. -Cardiology to perform cardiac catheterization.   Recent Labs Lab 03/14/17 2026 03/15/17 0222 03/16/17 1403 03/19/17 1334 03/19/17 1930 03/20/17 0100  TROPONINI 0.03* <0.03 0.11* 0.10* 0.11* 0.11*  -elevated troponin most likely secondary to demand ischemia, continue monitor. -Following discontinuation of beta blocker, nitroglycerin drip overnight patient has stabilized. No further episodes of pulses. -Spoke with Dr. Grayland Jack Cardiology, and he feels we need to proceed with cardiac catheterization, as patient continues to physically deteriorate   Pulmonary HTN  -See CHF   Bradycardia/Asystole -See CHF  Severe mitral valve regurgitation -Per cardiology, requires Dental consult prior to MV replacement. Unfortunately adult dental services unavailable this week. Have attempted to paged Jorja Loa DDS unsuccessfully. -Will require MV replacement once cardiac catheterization complete, and dental issues resolved   Acute on CKD stage III   Recent Labs Lab 03/17/17 0324 03/18/17 0346 03/19/17 0316 03/19/17  1930 03/20/17 0418 03/21/17 0444  CREATININE 5.00* 4.47* 3.51* 3.57* 3.56* 3.00*  -9/21 spoke with Dr. Roney Jaffe Nephrology concerning insertion of Vas-Cath and immediately dialyzing patient post cardiac catheterization to help prevent further kidney damage. Was informed this practice was found not to prevent contrast-induced renal failure and therefore no longer recommended. States currently no indication for dialysis. However recommends proceeding with cardiac catheterization and he will follow along and if patient requires HD will place Vas-Cath and perform HD when indicated. -Spoke at length with patient regarding her current renal function and the likely affect of the thigh from the cardiac catheterization of the kidneys. Appears to understand that she will require HD. Although appears depressed has agreed to HD if required.   Chronic anemia -No overt sign of bleeding -Anemia panel pending Trend CBC  Recent Labs Lab 03/16/17 1403 03/17/17 0324 03/18/17 0346 03/19/17 0316 03/20/17 0406 03/21/17 0444  HGB 10.2* 10.3* 9.7* 9.4* 9.3* 9.5*  -Stable -Hold ferrous sulfate   Diabetes type 2 controlled with, complication -4/03 Hemoglobin A1c = 6.2 -Moderate SSI   Hypothyroidism -Synthroid 112 g daily   Acute encephalopathy  -Multifactorial hyperperfusion, bradycardia, depression, chest pain -Resolved -Continue to hold sedating medication: Hold Ambien, Ultram, Zanaflex, Lyrica  Hypokalemia -Potassium goal> 4 -K-Dur 60 mEq  -Repeat K/Mg '@1500'$ : Close to goal will place further with a.m. labs    Elevated liver enzyme????   Leukocytosis -Increasing to infection vs stress? -Currently favor stress leukocytosis given patient negative fever, negative bands negative left shift will continue monitor closely    DVT prophylaxis: Subcutaneous heparin Code Status: Full Family Communication: None Disposition Plan: TBD   Consultants:  Cardiology Nephrology PC CM      Procedures/Significant Events:  9/13 Admit 9/13 Echocardiogram: LVEF= 60-65%. -Grade 2 diastolic  dysfunction.-Severe MR.-Mildly dilated LA and RA.-, mild to mod TR,. PAP 60 mm Hg. 9/18 TEE: EF 55-60%.-Severe restriction anterior/posterior leaflet MV w/ severe regurgitation  9/17 CXR: Bilateral pleural effusion.-Mild pulmonary interstitial edema.-Cardiomegaly  9/17 Brady/hypotensive/ intubated > transfer to ICU 9/20 patient to have pauses longest 6.7 seconds/bradycardia. All nodal blocking agents and NTG drips discontinued. 3 Amps Atropine administered.     I have personally reviewed and interpreted all radiology studies and my findings are as above.  VENTILATOR SETTINGS: None   Cultures None    Antimicrobials: Antibiotics Given (last 72 hours)    None       Devices     LINES / TUBES:      Continuous Infusions: . sodium chloride Stopped (03/17/17 0632)     Objective: Vitals:   03/21/17 0253 03/21/17 0448 03/21/17 0454 03/21/17 0746  BP: (!) 174/88  (!) 153/85 (!) 184/94  Pulse:   83 84  Resp:   20 (!) 23  Temp:   98.7 F (37.1 C) 98.5 F (36.9 C)  TempSrc:   Axillary Oral  SpO2:   93% 92%  Weight:  152 lb 5.4 oz (69.1 kg)    Height:        Intake/Output Summary (Last 24 hours) at 03/21/17 0844 Last data filed at 03/21/17 0751  Gross per 24 hour  Intake              670 ml  Output             1135 ml  Net             -465 ml   Filed Weights   03/18/17 0402 03/19/17 0900 03/21/17 0448  Weight: 150 lb 2.1 oz (68.1 kg) 151 lb 10.8 oz (68.8 kg) 152 lb 5.4 oz (69.1 kg)   Physical Exam:  General: A/O 4, positive acute respiratory distress ENT: Negative Runny nose, negative gingival bleeding, EXTREMELY POOR dentation (chronic, broken teeth, periodontal disease) Neck:  Negative scars, masses, torticollis, lymphadenopathy, JVD Lungs: Clear to auscultation bilaterally without wheezes or crackles Cardiovascular: Regular rate and rhythm, frequent PVC, positive systolic murmur, negative gallop or rub normal S1 and S2 Abdomen: negative abdominal pain,  nondistended, positive soft, bowel sounds, no rebound, no ascites, no appreciable mass Extremities: No significant cyanosis, clubbing, or edema bilateral lower extremities Skin: Negative rashes, lesions, ulcers Psychiatric:  Negative depression, negative anxiety, negative fatigue, negative mania  Central nervous system:  Cranial nerves II through XII intact, tongue/uvula midline, all extremities muscle strength 5/5, sensation intact throughout, negative dysarthria, negative expressive aphasia, negative receptive aphasia. .     Data Reviewed: Care during the described time interval was provided by me .  I have reviewed this patient's available data, including medical history, events of note, physical examination, and all test results as part of my evaluation.   CBC:  Recent Labs Lab 03/17/17 0324 03/18/17 0346 03/19/17 0316 03/20/17 0406 03/21/17 0444  WBC 13.6* 10.0 9.2 11.0* 12.1*  HGB 10.3* 9.7* 9.4* 9.3* 9.5*  HCT 29.2* 28.1* 27.5* 27.4* 28.3*  MCV 73.9* 74.7* 75.5* 76.1* 75.3*  PLT 287 226 229 234 458   Basic Metabolic Panel:  Recent Labs Lab 03/16/17 1403 03/17/17 0324 03/18/17 0346 03/19/17 0316 03/19/17 1930 03/20/17 0418 03/21/17 0444  NA 129* 132* 136 139 136 140 136  K 5.4* 4.3 4.1 3.9 3.6 3.8 3.4*  CL 98* 101 102 108 107 107 103  CO2 18* 21* 23 21* 21* 20* 22  GLUCOSE 228* 99 96 98 130* 133* 165*  BUN 63* 65* 66* 56* 54* 55* 51*  CREATININE 5.12* 5.00* 4.47* 3.51* 3.57* 3.56* 3.00*  CALCIUM 7.9* 8.2* 8.6* 8.4* 8.8* 9.6 9.1  MG 1.9 2.4 2.3 2.1 2.1  --   --   PHOS 7.4* 6.3* 5.7* 5.6*  --  5.1* 3.4   GFR: Estimated Creatinine Clearance: 19.5 mL/min (A) (by C-G formula based on SCr of 3 mg/dL (H)). Liver Function Tests:  Recent Labs Lab 03/15/17 0222  03/16/17 1403 03/17/17 0324 03/18/17 0346 03/19/17 0316 03/20/17 0418 03/21/17 0444  AST 18  --  395*  --   --   --   --  162*  ALT 23  --  440*  --   --   --   --  677*  ALKPHOS 83  --  81  --   --    --   --  85  BILITOT 0.9  --  1.4*  --   --   --   --  1.3*  PROT 5.7*  --  5.4*  --   --   --   --  6.0*  ALBUMIN 3.0*  < > 2.9* 3.2* 3.1* 3.0* 3.2* 3.2*  < > = values in this interval not displayed. No results for input(s): LIPASE, AMYLASE in the last 168 hours. No results for input(s): AMMONIA in the last 168 hours. Coagulation Profile:  Recent Labs Lab 03/17/17 0439  INR 1.53   Cardiac Enzymes:  Recent Labs Lab 03/15/17 0222 03/16/17 1403 03/19/17 1334 03/19/17 1930 03/20/17 0100  TROPONINI <0.03 0.11* 0.10* 0.11* 0.11*   BNP (last 3 results) No results for input(s): PROBNP in the  last 8760 hours. HbA1C: No results for input(s): HGBA1C in the last 72 hours. CBG:  Recent Labs Lab 03/20/17 0001 03/20/17 0310 03/20/17 0855 03/20/17 1138 03/20/17 1650  GLUCAP 108* 130* 136* 151* 133*   Lipid Profile: No results for input(s): CHOL, HDL, LDLCALC, TRIG, CHOLHDL, LDLDIRECT in the last 72 hours. Thyroid Function Tests: No results for input(s): TSH, T4TOTAL, FREET4, T3FREE, THYROIDAB in the last 72 hours. Anemia Panel:  Recent Labs  03/20/17 0406 03/20/17 0419  VITAMINB12 6,317*  --   FOLATE  --  19.6  FERRITIN 148  --   TIBC 256  --   IRON 62  --   RETICCTPCT 2.7  --    Urine analysis:    Component Value Date/Time   COLORURINE YELLOW 03/12/2017 [place] 03/12/2017 1349   LABSPEC 1.008 03/12/2017 1349   PHURINE 6.0 03/12/2017 1349   GLUCOSEU NEGATIVE 03/12/2017 1349   HGBUR NEGATIVE 03/12/2017 1349   BILIRUBINUR NEGATIVE 03/12/2017 1349   KETONESUR NEGATIVE 03/12/2017 1349   PROTEINUR 100 (A) 03/12/2017 1349   NITRITE NEGATIVE 03/12/2017 1349   LEUKOCYTESUR TRACE (A) 03/12/2017 1349   Sepsis Labs: _0 (procalcitonin:4,lacticidven:4)  ) Recent Results (from the past 240 hour(s))  MRSA PCR Screening     Status: None   Collection Time: 03/12/17  9:52 AM  Result Value Ref Range Status   MRSA by PCR NEGATIVE NEGATIVE Final      Comment:        The GeneXpert MRSA Assay (FDA approved for NASAL specimens only), is one component of a comprehensive MRSA colonization surveillance program. It is not intended to diagnose MRSA infection nor to guide or monitor treatment for MRSA infections.   MRSA PCR Screening     Status: None   Collection Time: 03/15/17  8:32 PM  Result Value Ref Range Status   MRSA by PCR NEGATIVE NEGATIVE Final    Comment:        The GeneXpert MRSA Assay (FDA approved for NASAL specimens only), is one component of a comprehensive MRSA colonization surveillance program. It is not intended to diagnose MRSA infection nor to guide or monitor treatment for MRSA infections.          Radiology Studies: No results found.      Scheduled Meds: . amLODipine  10 mg Oral Daily  . aspirin  81 mg Per Tube Daily  . chlorhexidine gluconate (MEDLINE KIT)  15 mL Mouth Rinse BID  . docusate sodium  100 mg Oral BID  . heparin subcutaneous  5,000 Units Subcutaneous Q8H  . hydrALAZINE  15 mg Intravenous Q6H  . insulin aspart  0-15 Units Subcutaneous TID WC  . levalbuterol  1.25 mg Nebulization TID  . levothyroxine  112 mcg Per Tube QAC breakfast  . mouth rinse  15 mL Mouth Rinse QID  . pantoprazole (PROTONIX) IV  40 mg Intravenous Daily  . sodium chloride flush  10-40 mL Intracatheter Q12H  . sodium chloride flush  3 mL Intravenous Q12H   Continuous Infusions: . sodium chloride Stopped (03/17/17 0632)     LOS: 9 days    Time spent: 40 minutes    Javelle Donigan, Geraldo Docker, MD [facility] Pager [phone]   If 7PM-7AM, please contact night-coverage [email or web address] Password [facility] 03/21/2017, 8:44 AM

## 2017-03-21 NOTE — Progress Notes (Signed)
Pt still in and out of aflutter/ST w/ frequent PVCs. Amio started at 60mg  around 1400. Relayed to attending MD who is currently at bedside

## 2017-03-22 ENCOUNTER — Inpatient Hospital Stay (HOSPITAL_COMMUNITY): Payer: Medicare Other

## 2017-03-22 DIAGNOSIS — D638 Anemia in other chronic diseases classified elsewhere: Secondary | ICD-10-CM

## 2017-03-22 DIAGNOSIS — N183 Chronic kidney disease, stage 3 unspecified: Secondary | ICD-10-CM

## 2017-03-22 DIAGNOSIS — I5042 Chronic combined systolic (congestive) and diastolic (congestive) heart failure: Secondary | ICD-10-CM

## 2017-03-22 DIAGNOSIS — N179 Acute kidney failure, unspecified: Secondary | ICD-10-CM

## 2017-03-22 DIAGNOSIS — I340 Nonrheumatic mitral (valve) insufficiency: Secondary | ICD-10-CM

## 2017-03-22 DIAGNOSIS — F1910 Other psychoactive substance abuse, uncomplicated: Secondary | ICD-10-CM

## 2017-03-22 DIAGNOSIS — E118 Type 2 diabetes mellitus with unspecified complications: Secondary | ICD-10-CM

## 2017-03-22 DIAGNOSIS — I5043 Acute on chronic combined systolic (congestive) and diastolic (congestive) heart failure: Secondary | ICD-10-CM

## 2017-03-22 DIAGNOSIS — G9340 Encephalopathy, unspecified: Secondary | ICD-10-CM

## 2017-03-22 DIAGNOSIS — R0989 Other specified symptoms and signs involving the circulatory and respiratory systems: Secondary | ICD-10-CM

## 2017-03-22 DIAGNOSIS — D72829 Elevated white blood cell count, unspecified: Secondary | ICD-10-CM

## 2017-03-22 DIAGNOSIS — I469 Cardiac arrest, cause unspecified: Secondary | ICD-10-CM

## 2017-03-22 DIAGNOSIS — I272 Pulmonary hypertension, unspecified: Secondary | ICD-10-CM

## 2017-03-22 LAB — CBC WITH DIFFERENTIAL/PLATELET
Basophils Absolute: 0 10*3/uL (ref 0.0–0.1)
Basophils Relative: 0 %
Eosinophils Absolute: 0.1 10*3/uL (ref 0.0–0.7)
Eosinophils Relative: 1 %
HCT: 28.7 % — ABNORMAL LOW (ref 36.0–46.0)
Hemoglobin: 9.5 g/dL — ABNORMAL LOW (ref 12.0–15.0)
Lymphocytes Relative: 7 %
Lymphs Abs: 0.8 10*3/uL (ref 0.7–4.0)
MCH: 25.1 pg — ABNORMAL LOW (ref 26.0–34.0)
MCHC: 33.1 g/dL (ref 30.0–36.0)
MCV: 75.7 fL — ABNORMAL LOW (ref 78.0–100.0)
Monocytes Absolute: 1 10*3/uL (ref 0.1–1.0)
Monocytes Relative: 10 %
Neutro Abs: 8.8 10*3/uL — ABNORMAL HIGH (ref 1.7–7.7)
Neutrophils Relative %: 82 %
Platelets: 222 10*3/uL (ref 150–400)
RBC: 3.79 MIL/uL — ABNORMAL LOW (ref 3.87–5.11)
RDW: 14.1 % (ref 11.5–15.5)
WBC: 10.6 10*3/uL — ABNORMAL HIGH (ref 4.0–10.5)

## 2017-03-22 LAB — BLOOD GAS, ARTERIAL
Acid-Base Excess: 2.2 mmol/L — ABNORMAL HIGH (ref 0.0–2.0)
Bicarbonate: 25.6 mmol/L (ref 20.0–28.0)
Drawn by: 213381
O2 Content: 10 L/min
O2 Saturation: 93.4 %
Patient temperature: 98.8
pCO2 arterial: 36 mmHg (ref 32.0–48.0)
pH, Arterial: 7.467 — ABNORMAL HIGH (ref 7.350–7.450)
pO2, Arterial: 66.2 mmHg — ABNORMAL LOW (ref 83.0–108.0)

## 2017-03-22 LAB — GLUCOSE, CAPILLARY
Glucose-Capillary: 147 mg/dL — ABNORMAL HIGH (ref 65–99)
Glucose-Capillary: 163 mg/dL — ABNORMAL HIGH (ref 65–99)
Glucose-Capillary: 143 mg/dL — ABNORMAL HIGH (ref 65–99)
Glucose-Capillary: 178 mg/dL — ABNORMAL HIGH (ref 65–99)

## 2017-03-22 LAB — MAGNESIUM: Magnesium: 1.9 mg/dL (ref 1.7–2.4)

## 2017-03-22 MED ORDER — FUROSEMIDE 10 MG/ML IJ SOLN: 80.0000 mg | Freq: Two times a day (BID) | INTRAMUSCULAR | Status: DC

## 2017-03-22 MED ORDER — HEPARIN SODIUM (PORCINE) 1000 UNIT/ML IJ SOLN
5000.0000 [IU] | Freq: Once | INTRAMUSCULAR | Status: DC
  Filled 2017-03-22: qty 5

## 2017-03-22 MED ORDER — FERROUS SULFATE 325 (65 FE) MG PO TABS
325.0000 mg | ORAL_TABLET | Freq: Two times a day (BID) | ORAL | Status: DC
  Administered 2017-03-22 – 2017-03-23 (×3): 325 mg via ORAL
  Filled 2017-03-22 (×3): qty 1

## 2017-03-22 MED ORDER — PANTOPRAZOLE SODIUM 40 MG PO TBEC
40.0000 mg | DELAYED_RELEASE_TABLET | Freq: Every day | ORAL | Status: DC
  Administered 2017-03-23 – 2017-04-04 (×12): 40 mg via ORAL
  Filled 2017-03-22 (×13): qty 1

## 2017-03-22 MED ORDER — LIDOCAINE HCL (PF) 1 % IJ SOLN
INTRAMUSCULAR | Status: AC
  Filled 2017-03-22: qty 30

## 2017-03-22 MED ORDER — FUROSEMIDE 10 MG/ML IJ SOLN: 80.0000 mg | Freq: Once | INTRAMUSCULAR | Status: DC

## 2017-03-22 MED ORDER — VITAMIN C 500 MG PO TABS
250.0000 mg | ORAL_TABLET | Freq: Two times a day (BID) | ORAL | Status: DC
  Administered 2017-03-22 – 2017-04-04 (×25): 250 mg via ORAL
  Filled 2017-03-22 (×27): qty 1

## 2017-03-22 MED ORDER — FUROSEMIDE 10 MG/ML IJ SOLN
80.0000 mg | Freq: Three times a day (TID) | INTRAMUSCULAR | Status: DC
  Administered 2017-03-22 – 2017-03-24 (×5): 80 mg via INTRAVENOUS
  Filled 2017-03-22 (×6): qty 8

## 2017-03-22 MED ORDER — MORPHINE SULFATE 15 MG PO TABS
7.5000 mg | ORAL_TABLET | ORAL | Status: DC | PRN
  Administered 2017-03-22 – 2017-03-25 (×7): 7.5 mg via ORAL
  Filled 2017-03-22 (×7): qty 1

## 2017-03-22 MED ORDER — FUROSEMIDE 10 MG/ML IJ SOLN
160.0000 mg | Freq: Once | INTRAVENOUS | Status: AC
  Administered 2017-03-22: 160 mg via INTRAVENOUS
  Filled 2017-03-22: qty 16

## 2017-03-22 NOTE — Progress Notes (Addendum)
[facility] Progress Note  Subjective: asked to see pt again for vol overload/ CHF.  Creat down to 3.0.  Wt's up 72kg  (lowest wt 65kg) and CXR w diffuse edema.   UOP yest 2.2 L on IV lasix 55m qd. Pt is "anxious", and SOB, not severe, no CP , no abd pain.    Vitals:   03/22/17 0417 03/22/17 0800 03/22/17 0815 03/22/17 0904  BP:  (!) 168/82    Pulse:  70    Resp:  (!) 22    Temp:   98.8 F (37.1 C)   TempSrc:   Oral   SpO2:  95%  95%  Weight: 72.4 kg (159 lb 9.8 oz)     Height:        Inpatient medications: . amLODipine  10 mg Oral Daily  . aspirin  81 mg Per Tube Daily  . chlorhexidine gluconate (MEDLINE KIT)  15 mL Mouth Rinse BID  . docusate sodium  100 mg Oral BID  . ferrous sulfate  325 mg Oral BID WC  . heparin subcutaneous  5,000 Units Subcutaneous Q8H  . hydrALAZINE  15 mg Intravenous Q6H  . insulin aspart  0-15 Units Subcutaneous TID WC  . levalbuterol  1.25 mg Nebulization TID  . levothyroxine  112 mcg Per Tube QAC breakfast  . mouth rinse  15 mL Mouth Rinse QID  . pantoprazole (PROTONIX) IV  40 mg Intravenous Daily  . sodium chloride flush  10-40 mL Intracatheter Q12H  . sodium chloride flush  3 mL Intravenous Q12H  . vitamin C  250 mg Oral BID   . sodium chloride Stopped (03/17/17 02575  . amiodarone 30 mg/hr (03/22/17 0017)  . furosemide     sodium chloride, acetaminophen **OR** acetaminophen, bisacodyl, hydrALAZINE, LORazepam, ondansetron (ZOFRAN) IV, sennosides, sodium chloride flush, sodium chloride flush  Exam: On nasal O2, not in distress Chest rales R base, L clear RRR no RG, 3/6 sem Abd soft ntnd no ascites Ext no LE edema NF, follows commands  Home meds :   - norvasc/ hydralazine 100 tid/ labetalol 200 bid/ minoxidil 2.5 qd - albuterol/ asa/ lipitor/ T4 / singulair/ PPI / KCl  - wellbutrin / cymbalta/ neurontin/ Lyrica / ambien prn / oxy prn   CXR 9/23 diffuse edema  Impression: 1.  Acute on CKD 4 - improving creat but  worsening vol status w/ pulm edema.  Needs heart cath and valve replacement; with CKD stage IV bordering stage V will likely need dialysis in order to stabilize her first.  Will give IV lasix now and reassess this afternoon, possibly HD if not responding.  Have d/w pt and daughter, they are agreeable to dialysis if needed this week before or after procedures as needed.  2.  Pulm edema - as above 3.  Severe MR - per cards 4.  HTN - bp's high, vol excess 5.  Hx CVA/ depression/ chronic pain  Plan - as above   RKelly SplinterMD [facility] pager [phone]  03/22/2017, 11:40 AM    Recent Labs Lab 03/19/17 0316 03/19/17 1930 03/20/17 0418 03/21/17 0444 03/21/17 1345  NA 139 136 140 136  --   K 3.9 3.6 3.8 3.4* 3.7  CL 108 107 107 103  --   CO2 21* 21* 20* 22  --   GLUCOSE 98 130* 133* 165*  --   BUN 56* 54* 55* 51*  --   CREATININE 3.51* 3.57* 3.56* 3.00*  --  CALCIUM 8.4* 8.8* 9.6 9.1  --   PHOS 5.6*  --  5.1* 3.4  --     Recent Labs Lab 03/16/17 1403  03/19/17 0316 03/20/17 0418 03/21/17 0444  AST 395*  --   --   --  162*  ALT 440*  --   --   --  677*  ALKPHOS 81  --   --   --  85  BILITOT 1.4*  --   --   --  1.3*  PROT 5.4*  --   --   --  6.0*  ALBUMIN 2.9*  < > 3.0* 3.2* 3.2*  < > = values in this interval not displayed.  Recent Labs Lab 03/20/17 0406 03/21/17 0444 03/22/17 0914  WBC 11.0* 12.1* 10.6*  NEUTROABS  --   --  8.8*  HGB 9.3* 9.5* 9.5*  HCT 27.4* 28.3* 28.7*  MCV 76.1* 75.3* 75.7*  PLT 234 243 222   Iron/TIBC/Ferritin/ %Sat    Component Value Date/Time   IRON 62 03/20/2017 0406   TIBC 256 03/20/2017 0406   FERRITIN 148 03/20/2017 0406   IRONPCTSAT 24 03/20/2017 0406

## 2017-03-22 NOTE — Procedures (Addendum)
Under sterile conditions and using US guidance , I attempted to place R femoral vein HD cath.  However the vein was small and difficult to cannulate.  Cannulated x 1 after several tries and wire wouldn't pass, possibly hit fem artery once.  Held pressure and no sign of hematoma. Procedure aborted.  Explained to pt/ family pt seems to be responding to IV lasix ,  will continue IV lasix and reassess any need for dialysis on a daily basis, w/ goal of resolving pulm edema so patient can have needed procedures.  Kelly Splinter MD [employer] pgr [phone]   03/22/2017, 3:40 PM

## 2017-03-22 NOTE — Progress Notes (Signed)
Progress Note  Patient Name: Jocelyn Hill. Hepworth Date of Encounter: 03/22/2017  Primary Cardiologist: Bettina Gavia,   Subjective   "I can't get my breath"  N oCP    Inpatient Medications    Scheduled Meds: . amLODipine  10 mg Oral Daily  . aspirin  81 mg Per Tube Daily  . chlorhexidine gluconate (MEDLINE KIT)  15 mL Mouth Rinse BID  . docusate sodium  100 mg Oral BID  . ferrous sulfate  325 mg Oral BID WC  . furosemide  80 mg Intravenous Once  . heparin subcutaneous  5,000 Units Subcutaneous Q8H  . hydrALAZINE  15 mg Intravenous Q6H  . insulin aspart  0-15 Units Subcutaneous TID WC  . levalbuterol  1.25 mg Nebulization TID  . levothyroxine  112 mcg Per Tube QAC breakfast  . mouth rinse  15 mL Mouth Rinse QID  . pantoprazole (PROTONIX) IV  40 mg Intravenous Daily  . sodium chloride flush  10-40 mL Intracatheter Q12H  . sodium chloride flush  3 mL Intravenous Q12H  . vitamin C  250 mg Oral BID   Continuous Infusions: . sodium chloride Stopped (03/17/17 0712)  . amiodarone 30 mg/hr (03/22/17 0017)   PRN Meds: sodium chloride, acetaminophen **OR** acetaminophen, bisacodyl, hydrALAZINE, LORazepam, ondansetron (ZOFRAN) IV, sennosides, sodium chloride flush, sodium chloride flush   Vital Signs    Vitals:   03/22/17 0417 03/22/17 0800 03/22/17 0815 03/22/17 0904  BP:  (!) 168/82    Pulse:  70    Resp:  (!) 22    Temp:   98.8 F (37.1 C)   TempSrc:   Oral   SpO2:  95%  95%  Weight: 159 lb 9.8 oz (72.4 kg)     Height:        Intake/Output Summary (Last 24 hours) at 03/22/17 1058 Last data filed at 03/22/17 0800  Gross per 24 hour  Intake          1811.26 ml  Output             2875 ml  Net         -1063.74 ml   Filed Weights   03/19/17 0900 03/21/17 0448 03/22/17 0417  Weight: 151 lb 10.8 oz (68.8 kg) 152 lb 5.4 oz (69.1 kg) 159 lb 9.8 oz (72.4 kg)    Telemetry    SR and atrial flutter  Mostly SR   - Personally Reviewed  ECG    Physical Exam   GEN: No acute  distress.   Neck: JVP imldly increaed   Cardiac: RRR, Gr III/VI systolic murmur apex rubs, or gallops.  Respiratory: Rales bilaterally   GI: Soft, nontender, non-distended  MS: No edema; No deformity. Neuro:  Nonfocal  Psych: Normal affect   Labs    Chemistry Recent Labs Lab 03/16/17 1403  03/19/17 0316 03/19/17 1930 03/20/17 0418 03/21/17 0444 03/21/17 1345  NA 129*  < > 139 136 140 136  --   K 5.4*  < > 3.9 3.6 3.8 3.4* 3.7  CL 98*  < > 108 107 107 103  --   CO2 18*  < > 21* 21* 20* 22  --   GLUCOSE 228*  < > 98 130* 133* 165*  --   BUN 63*  < > 56* 54* 55* 51*  --   CREATININE 5.12*  < > 3.51* 3.57* 3.56* 3.00*  --   CALCIUM 7.9*  < > 8.4* 8.8* 9.6 9.1  --   PROT  5.4*  --   --   --   --  6.0*  --   ALBUMIN 2.9*  < > 3.0*  --  3.2* 3.2*  --   AST 395*  --   --   --   --  162*  --   ALT 440*  --   --   --   --  677*  --   ALKPHOS 81  --   --   --   --  85  --   BILITOT 1.4*  --   --   --   --  1.3*  --   GFRNONAA 8*  < > 13* 12* 12* 15*  --   GFRAA 9*  < > 15* 14* 14* 18*  --   ANIONGAP 13  < > _0 --   < > = values in this interval not displayed.   Hematology Recent Labs Lab 03/20/17 0406 03/21/17 0444 03/22/17 0914  WBC 11.0* 12.1* 10.6*  RBC 3.60*  3.60* 3.76* 3.79*  HGB 9.3* 9.5* 9.5*  HCT 27.4* 28.3* 28.7*  MCV 76.1* 75.3* 75.7*  MCH 25.8* 25.3* 25.1*  MCHC 33.9 33.6 33.1  RDW 14.0 14.1 14.1  PLT 234 243 222    Cardiac Enzymes Recent Labs Lab 03/16/17 1403 03/19/17 1334 03/19/17 1930 03/20/17 0100  TROPONINI 0.11* 0.10* 0.11* 0.11*   No results for input(s): TROPIPOC in the last 168 hours.   BNPNo results for input(s): BNP, PROBNP in the last 168 hours.   DDimer No results for input(s): DDIMER in the last 168 hours.   Radiology    Dg Chest Port 1 View  Result Date: 03/22/2017 CLINICAL DATA:  Increasing shortness of breath EXAM: PORTABLE CHEST 1 VIEW COMPARISON:  March 21, 2017 FINDINGS: No pneumothorax. Interstitial  opacities throughout the left lung are stable. Effusion and opacity in the right base is mildly improved. Focal infiltrate in the left retrocardiac region is unchanged. No other interval changes. IMPRESSION: Persistent pulmonary opacities as above.  Suggested small effusions. Electronically Signed   By: Dorise Bullion III M.D   On: 03/22/2017 10:22   Dg Chest Port 1 View  Result Date: 03/21/2017 CLINICAL DATA:  Shortness of breath. Concern for pulmonary edema. History of diabetes and hypertension. EXAM: PORTABLE CHEST 1 VIEW COMPARISON:  Chest x-rays dated 03/18/2017 and 03/17/2017. FINDINGS: Increasing central pulmonary vascular congestion and perihilar interstitial edema. Bibasilar opacities, some component likely related to associated pulmonary edema. Suspect additional atelectasis and/or small pleural effusions at each lung base. No pneumothorax seen. Stable cardiomegaly. Atherosclerotic changes noted at the aortic arch. Endotracheal tube and enteric tube have been removed. IMPRESSION: 1. Increasing central pulmonary vascular congestion and perihilar edema indicating CHF/volume overload. 2. Bibasilar opacities are not significantly changed, most likely a combination of atelectasis and small pleural effusions. Suspect some degree of superimposed pulmonary edema. Additional underlying pneumonia cannot be excluded if febrile. 3. Stable cardiomegaly. 4. Aortic atherosclerosis. Electronically Signed   By: Franki Cabot M.D.   On: 03/21/2017 09:20    Cardiac Studies    Patient Profile     66 y.o. female with resp  Assessment & Plan    1  Severe MR  Pt seen by surgery yesterday  REcomm Replacement when CHF improved   Will need cath when stabilized    2  Acute on chronic diastolic CHF with severe MR  Pt still with evid of pulm edema on exam  Will increase to bid  Follow renal function   Needs MVR   3  PAF/Flutter  Continue IV amio  4  Trop elevaton  Cath when stabilized  5  Bradycardia  No  signif spells   6  Renal  Follow Cr  Renal has signed off for now.     7    HTN  Pt still hypertensive  Will increase hydralazine    For questions or updates, please contact [place] Please consult [email or web address] for contact info under Cardiology/STEMI.      Signed, Dorris Carnes, MD  03/22/2017, 10:58 AM

## 2017-03-22 NOTE — Progress Notes (Signed)
PHARMACIST - PHYSICIAN COMMUNICATION   CONCERNING: IV to Oral Route Change Policy  RECOMMENDATION: This patient is receiving Pantoprazole by the intravenous route.  Based on criteria approved by the Pharmacy and Therapeutics Committee, the intravenous medication(s) is/are being converted to the equivalent oral dose form(s).  DESCRIPTION: These criteria include:  The patient is eating (either orally or via tube) and/or has been taking other orally administered medications for a least 24 hours  The patient has no evidence of active gastrointestinal bleeding or impaired GI absorption (gastrectomy, short bowel, patient on TNA or NPO).  If you have questions about this conversion, please contact the Pharmacy Department  []   [phone] )  [facility] []   [phone] )  [facility] [x]   [phone] )  [facility] []   [phone] )  [facility] []   [phone] )  [place] 03/22/2017 12:35 PM

## 2017-03-22 NOTE — Progress Notes (Signed)
PROGRESS NOTE    Jocelyn Hill  QMV:[id number] DOB: 04-20-1951 DOA: 03/12/2017 PCP: [name], MD   Brief Narrative:  66 y.o. BF PMHx Anxiety, Depression, CVA, poorly controlled HTN, CKD stage III, DM type II with Peripheral Neuropathy, Hypothyroidism    Who states that over the past month, she is noted she has become more winded. Then starting on Saturday, 9/8, patient cut the point where she felt like she is having a lot of trouble breathing. The symptoms continued to progress to the point where she could not take it anymore and she came in to the emergency room at [facility] for further evaluation. Patient found to have a BNP of 1700 with a new diagnosis of CHF. Chest x-ray noted moderate bilateral pleural effusions. Patient was given Lasix and put on nitroglycerin patch. Blood pressure systolic in the 284X-324M. Patient initially BiPAP to keep oxygen saturations greater than 90%. In addition, creatinine noted to be 3.2 or baseline was 2.1. Because of worsening creatinine and decompensated CHF, it was felt best to transfer this patient to [facility]. Hospitalists accepted transfer.    Subjective: 9/23 A/O 4, positive SOB, negative N/V, negative abdominal pain,     Assessment & Plan:   Principal Problem:   Acute on chronic respiratory failure with hypoxia (HCC) Active Problems:   Hypertension   Hypothyroidism   Diet-controlled diabetes mellitus (HCC)   Acute diastolic (congestive) heart failure (HCC)   AKI (acute kidney injury) (HCC)   CKD (chronic kidney disease), stage III   CHF (congestive heart failure) (HCC)   Dysphagia   Malignant hypertension   Microcytic anemia   Severe mitral regurgitation   CKD (chronic kidney disease), stage IV (HCC)   Substance abuse   Acute diastolic CHF (congestive heart failure) (HCC)   Pulmonary hypertension (HCC)   Asystole (HCC)   Non-rheumatic mitral regurgitation   Acute renal failure superimposed on stage 3 chronic  kidney disease ([place])   Anemia due to chronic kidney disease   Controlled diabetes mellitus type 2 with complications (HCC)   Acute encephalopathy   Acute Respiratory failure with Hypoxia/Pulmonary Vascular congestion -Currently on 10 L O2 via HFNC -Patient with good air movement/negative wheezing do not believe COPD exacerbation -Xopenex TID.    Substance abuse -UDS positive for benzodiazepine, opiates, marijuana. Unable to differentiate if positive prior to hospitalization for Benzodiazepine/Opiates.   Acute Diastolic CHF -Strict in and out since admission -6.1 L -Monitor CVP. Patient fluid overloaded.9/23 CVP= 14 mmHg -Daily weight Filed Weights   03/19/17 0900 03/21/17 0448 03/22/17 0417  Weight: 151 lb 10.8 oz (68.8 kg) 152 lb 5.4 oz (69.1 kg) 159 lb 9.8 oz (72.4 kg)  -Discontinued all nodal blocking agent -Amlodipine 10 mg  daily -Furosemide 80 mg 1 -Hydralazine 15 mg  QID -Hydralazine  PRN -Monitor closely for bradycardia. Evening of 9/20 patient with episodes of bradycardia/asystole pauses see addendum #2  -NCM Roque Cash working on seeing a). If dental services available next week. b). No dental services available next week how to transport patient into town to have teeth evaluated/removed.     Recent Labs Lab 03/16/17 1403 03/19/17 1334 03/19/17 1930 03/20/17 0100  TROPONINI 0.11* 0.10* 0.11* 0.11*  -elevated troponin most likely secondary to demand ischemia, continue monitor. -As soon patient more stable from fluid perspective will proceed to cardiac catheterization. -Nephrology has agreed to perform HD    Pulmonary HTN  -See CHF   Bradycardia/Asystole -See CHF  Severe Mitral Valve regurgitation -Per  cardiology, requires Dental consult prior to MV replacement. Unfortunately adult dental services unavailable this week. Have attempted to page Jorja Loa DDS unsuccessfully. -Will require MV replacement once cardiac catheterization complete, and dental issues  resolved   Acute on CKD stage III    Recent Labs Lab 03/17/17 0324 03/18/17 0346 03/19/17 0316 03/19/17 1930 03/20/17 0418 03/21/17 0444  CREATININE 5.00* 4.47* 3.51* 3.57* 3.56* 3.00*  -9/23 met with  Dr. Roney Jaffe Nephrology again concerning patient's fluid status and renal function. Has agreed to start patient on HD  -Spoke with daughters yesterday and they understood and patient will require HD in order to proceed with definitive treatment for her heart.    Chronic anemia(Mixed anemia) -No overt sign of bleeding -Anemia panel: Although MCV 76, panel not consistent pure microcytic anemia.    Recent Labs Lab 03/16/17 1403 03/17/17 0324 03/18/17 0346 03/19/17 0316 03/20/17 0406 03/21/17 0444  HGB 10.2* 10.3* 9.7* 9.4* 9.3* 9.5*  -Stable -Restart Fe 325 mg BID + vitamin C 250 mg BID   Diabetes type 2 controlled with, complication -0/56 Hemoglobin A1c = 6.2 -Moderate SSI    Hypothyroidism -Synthroid 112 g daily    Acute encephalopathy  -Multifactorial hyperperfusion, bradycardia, depression, chest pain -Resolved Continue to hold sedating medication: Hold Ambien, Ultram, Zanaflex, Lyrica  Hypokalemia -Potassium goal> 4 -K-Dur 60 mEq  -Repeat K/Mg _0 : Close to goal    Elevated liver enzyme????   Leukocytosis -Increasing  infection vs stress Induced? -Currently favor stress  induced leukocytosis given patient negative fever, negative bands negative left shift will continue monitor closely    DVT prophylaxis: Subcutaneous heparin Code Status: Full Family Communication: None Disposition Plan: TBD   Consultants:  Cardiology Nephrology PC CM      Procedures/Significant Events:  9/13 Admit 9/13 Echocardiogram: LVEF= 60-65%. -Grade 2 diastolic dysfunction.-Severe MR.-Mildly dilated LA and RA.-, mild to mod TR,. PAP 60 mm Hg. 9/18 TEE: EF 55-60%.-Severe restriction anterior/posterior leaflet MV w/ severe regurgitation  9/17 CXR: Bilateral  pleural effusion.-Mild pulmonary interstitial edema.-Cardiomegaly  9/17 Brady/hypotensive/ intubated > transfer to ICU 9/20 patient to have pauses longest 6.7 seconds/bradycardia. All nodal blocking agents and NTG drips discontinued. 3 Amps Atropine administered.     I have personally reviewed and interpreted all radiology studies and my findings are as above.  VENTILATOR SETTINGS: None   Cultures None    Antimicrobials: Anti-infectives    Start     Stop   03/16/17 1000  azithromycin (ZITHROMAX) tablet 250 mg  Status:  Discontinued     03/16/17 1316   03/14/17 0900  azithromycin (ZITHROMAX) 500 mg in dextrose 5 % 250 mL IVPB  Status:  Discontinued     03/16/17 0804        Devices     LINES / TUBES:      Continuous Infusions: . sodium chloride Stopped (03/17/17 9794)  . amiodarone 30 mg/hr (03/22/17 0017)     Objective: Vitals:   03/22/17 0417 03/22/17 0800 03/22/17 0815 03/22/17 0904  BP:  (!) 168/82    Pulse:  70    Resp:  (!) 22    Temp:   98.8 F (37.1 C)   TempSrc:   Oral   SpO2:  95%  95%  Weight: 159 lb 9.8 oz (72.4 kg)     Height:        Intake/Output Summary (Last 24 hours) at 03/22/17 0947 Last data filed at 03/22/17 0800  Gross per 24 hour  Intake  1811.26 ml  Output             2875 ml  Net         -1063.74 ml   Filed Weights   03/19/17 0900 03/21/17 0448 03/22/17 0417  Weight: 151 lb 10.8 oz (68.8 kg) 152 lb 5.4 oz (69.1 kg) 159 lb 9.8 oz (72.4 kg)   Physical Exam:  General: A/O x4, positive acute respiratory distress ENT: Negative Runny nose, negative gingival bleeding,  EXTREMELY POOR dentation dentation ( chronic broken teeth, periodontal disease ) Neck:  Negative scars, masses, torticollis, lymphadenopathy, JVD Lungs: Clear to auscultation bilateral upper lobes, decreased to absent breath sounds bibasilar, positive expiratory wheeze Lt >> Rt  Cardiovascular: Regular rate and rhythm, frequent PVC, positive systolic  murmur, negative gallop, rub. Normal S1/S2  Abdomen:  negative abdominal pain, nondistended, positive soft, bowel sounds, no rebound, no ascites, no appreciable mass Extremities: No significant cyanosis, clubbing, or edema bilateral lower extremities Skin: Negative rashes, lesions, ulcers Psychiatric:  Negative depression, negative anxiety, negative fatigue, negative mania  Central nervous system:  Cranial nerves II through XII intact, tongue/uvula midline, all extremities muscle strength 5/5, sensation intact throughout, negative dysarthria, negative expressive aphasia, negative receptive aphasia. .     Data Reviewed: Care during the described time interval was provided by me .  I have reviewed this patient's available data, including medical history, events of note, physical examination, and all test results as part of my evaluation.   CBC:  Recent Labs Lab 03/17/17 0324 03/18/17 0346 03/19/17 0316 03/20/17 0406 03/21/17 0444  WBC 13.6* 10.0 9.2 11.0* 12.1*  HGB 10.3* 9.7* 9.4* 9.3* 9.5*  HCT 29.2* 28.1* 27.5* 27.4* 28.3*  MCV 73.9* 74.7* 75.5* 76.1* 75.3*  PLT 287 226 229 234 734   Basic Metabolic Panel:  Recent Labs Lab 03/17/17 0324 03/18/17 0346 03/19/17 0316 03/19/17 1930 03/20/17 0418 03/21/17 0444 03/21/17 1345  NA 132* 136 139 136 140 136  --   K 4.3 4.1 3.9 3.6 3.8 3.4* 3.7  CL 101 102 108 107 107 103  --   CO2 21* 23 21* 21* 20* 22  --   GLUCOSE 99 96 98 130* 133* 165*  --   BUN 65* 66* 56* 54* 55* 51*  --   CREATININE 5.00* 4.47* 3.51* 3.57* 3.56* 3.00*  --   CALCIUM 8.2* 8.6* 8.4* 8.8* 9.6 9.1  --   MG 2.4 2.3 2.1 2.1  --   --  2.0  PHOS 6.3* 5.7* 5.6*  --  5.1* 3.4  --    GFR: Estimated Creatinine Clearance: 19.5 mL/min (A) (by C-G formula based on SCr of 3 mg/dL (H)). Liver Function Tests:  Recent Labs Lab 03/16/17 1403 03/17/17 0324 03/18/17 0346 03/19/17 0316 03/20/17 0418 03/21/17 0444  AST 395*  --   --   --   --  162*  ALT 440*  --    --   --   --  677*  ALKPHOS 81  --   --   --   --  85  BILITOT 1.4*  --   --   --   --  1.3*  PROT 5.4*  --   --   --   --  6.0*  ALBUMIN 2.9* 3.2* 3.1* 3.0* 3.2* 3.2*   No results for input(s): LIPASE, AMYLASE in the last 168 hours. No results for input(s): AMMONIA in the last 168 hours. Coagulation Profile:  Recent Labs Lab 03/17/17 0439  INR 1.53  Cardiac Enzymes:  Recent Labs Lab 03/16/17 1403 03/19/17 1334 03/19/17 1930 03/20/17 0100  TROPONINI 0.11* 0.10* 0.11* 0.11*   BNP (last 3 results) No results for input(s): PROBNP in the last 8760 hours. HbA1C: No results for input(s): HGBA1C in the last 72 hours. CBG:  Recent Labs Lab 03/20/17 1650 03/21/17 0742 03/21/17 1204 03/21/17 1714 03/21/17 2106  GLUCAP 133* 179* 135* 256* 143*   Lipid Profile: No results for input(s): CHOL, HDL, LDLCALC, TRIG, CHOLHDL, LDLDIRECT in the last 72 hours. Thyroid Function Tests: No results for input(s): TSH, T4TOTAL, FREET4, T3FREE, THYROIDAB in the last 72 hours. Anemia Panel:  Recent Labs  03/20/17 0406 03/20/17 0419  VITAMINB12 6,317*  --   FOLATE  --  19.6  FERRITIN 148  --   TIBC 256  --   IRON 62  --   RETICCTPCT 2.7  --    Urine analysis:    Component Value Date/Time   COLORURINE YELLOW 03/12/2017 [place] 03/12/2017 1349   LABSPEC 1.008 03/12/2017 1349   PHURINE 6.0 03/12/2017 1349   GLUCOSEU NEGATIVE 03/12/2017 1349   HGBUR NEGATIVE 03/12/2017 1349   BILIRUBINUR NEGATIVE 03/12/2017 1349   KETONESUR NEGATIVE 03/12/2017 1349   PROTEINUR 100 (A) 03/12/2017 1349   NITRITE NEGATIVE 03/12/2017 1349   LEUKOCYTESUR TRACE (A) 03/12/2017 1349   Sepsis Labs: _0 (procalcitonin:4,lacticidven:4)  ) Recent Results (from the past 240 hour(s))  MRSA PCR Screening     Status: None   Collection Time: 03/12/17  9:52 AM  Result Value Ref Range Status   MRSA by PCR NEGATIVE NEGATIVE Final    Comment:        The GeneXpert MRSA Assay  (FDA approved for NASAL specimens only), is one component of a comprehensive MRSA colonization surveillance program. It is not intended to diagnose MRSA infection nor to guide or monitor treatment for MRSA infections.   MRSA PCR Screening     Status: None   Collection Time: 03/15/17  8:32 PM  Result Value Ref Range Status   MRSA by PCR NEGATIVE NEGATIVE Final    Comment:        The GeneXpert MRSA Assay (FDA approved for NASAL specimens only), is one component of a comprehensive MRSA colonization surveillance program. It is not intended to diagnose MRSA infection nor to guide or monitor treatment for MRSA infections.          Radiology Studies: Dg Chest Port 1 View  Result Date: 03/21/2017 CLINICAL DATA:  Shortness of breath. Concern for pulmonary edema. History of diabetes and hypertension. EXAM: PORTABLE CHEST 1 VIEW COMPARISON:  Chest x-rays dated 03/18/2017 and 03/17/2017. FINDINGS: Increasing central pulmonary vascular congestion and perihilar interstitial edema. Bibasilar opacities, some component likely related to associated pulmonary edema. Suspect additional atelectasis and/or small pleural effusions at each lung base. No pneumothorax seen. Stable cardiomegaly. Atherosclerotic changes noted at the aortic arch. Endotracheal tube and enteric tube have been removed. IMPRESSION: 1. Increasing central pulmonary vascular congestion and perihilar edema indicating CHF/volume overload. 2. Bibasilar opacities are not significantly changed, most likely a combination of atelectasis and small pleural effusions. Suspect some degree of superimposed pulmonary edema. Additional underlying pneumonia cannot be excluded if febrile. 3. Stable cardiomegaly. 4. Aortic atherosclerosis. Electronically Signed   By: Franki Cabot M.D.   On: 03/21/2017 09:20        Scheduled Meds: . amLODipine  10 mg Oral Daily  . aspirin  81 mg Per Tube Daily  . chlorhexidine gluconate (MEDLINE KIT)  15 mL  Mouth Rinse BID  . docusate sodium  100 mg Oral BID  . heparin subcutaneous  5,000 Units Subcutaneous Q8H  . hydrALAZINE  15 mg Intravenous Q6H  . insulin aspart  0-15 Units Subcutaneous TID WC  . levalbuterol  1.25 mg Nebulization TID  . levothyroxine  112 mcg Per Tube QAC breakfast  . mouth rinse  15 mL Mouth Rinse QID  . pantoprazole (PROTONIX) IV  40 mg Intravenous Daily  . sodium chloride flush  10-40 mL Intracatheter Q12H  . sodium chloride flush  3 mL Intravenous Q12H   Continuous Infusions: . sodium chloride Stopped (03/17/17 9872)  . amiodarone 30 mg/hr (03/22/17 0017)     LOS: 10 days    Time spent: 40 minutes    Brayen Bunn, Geraldo Docker, MD [facility] Pager [phone]   If 7PM-7AM, please contact night-coverage [email or web address] Password [facility] 03/22/2017, 9:47 AM

## 2017-03-23 ENCOUNTER — Inpatient Hospital Stay (HOSPITAL_COMMUNITY): Payer: Medicare Other

## 2017-03-23 LAB — BASIC METABOLIC PANEL
Anion gap: 8 (ref 5–15)
BUN: 34 mg/dL — ABNORMAL HIGH (ref 6–20)
CO2: 31 mmol/L (ref 22–32)
Calcium: 9.2 mg/dL (ref 8.9–10.3)
Chloride: 97 mmol/L — ABNORMAL LOW (ref 101–111)
Creatinine, Ser: 2.62 mg/dL — ABNORMAL HIGH (ref 0.44–1.00)
GFR calc Af Amer: 21 mL/min — ABNORMAL LOW (ref >60)
GFR calc non Af Amer: 18 mL/min — ABNORMAL LOW (ref >60)
Glucose, Bld: 146 mg/dL — ABNORMAL HIGH (ref 65–99)
Potassium: 3.2 mmol/L — ABNORMAL LOW (ref 3.5–5.1)
Sodium: 136 mmol/L (ref 135–145)

## 2017-03-23 LAB — GLUCOSE, CAPILLARY
Glucose-Capillary: 173 mg/dL — ABNORMAL HIGH (ref 65–99)
Glucose-Capillary: 141 mg/dL — ABNORMAL HIGH (ref 65–99)
Glucose-Capillary: 207 mg/dL — ABNORMAL HIGH (ref 65–99)
Glucose-Capillary: 109 mg/dL — ABNORMAL HIGH (ref 65–99)

## 2017-03-23 LAB — MAGNESIUM: Magnesium: 1.7 mg/dL (ref 1.7–2.4)

## 2017-03-23 LAB — HEPARIN LEVEL (UNFRACTIONATED): Heparin Unfractionated: 1.2 IU/mL — ABNORMAL HIGH (ref 0.30–0.70)

## 2017-03-23 MED ORDER — POTASSIUM CHLORIDE CRYS ER 20 MEQ PO TBCR
20.0000 meq | EXTENDED_RELEASE_TABLET | Freq: Two times a day (BID) | ORAL | Status: DC
  Administered 2017-03-23 (×2): 20 meq via ORAL
  Filled 2017-03-23 (×2): qty 1

## 2017-03-23 MED ORDER — ACETAMINOPHEN 650 MG RE SUPP: 325.0000 mg | Freq: Four times a day (QID) | RECTAL | Status: DC | PRN

## 2017-03-23 MED ORDER — HYDRALAZINE HCL 20 MG/ML IJ SOLN
10.0000 mg | INTRAMUSCULAR | Status: DC | PRN
  Administered 2017-03-26: 10 mg via INTRAVENOUS
  Filled 2017-03-23: qty 1

## 2017-03-23 MED ORDER — SODIUM CHLORIDE 0.9 % IV SOLN
250.0000 mg | Freq: Every day | INTRAVENOUS | Status: AC
  Administered 2017-03-23 – 2017-03-26 (×4): 250 mg via INTRAVENOUS
  Filled 2017-03-23 (×4): qty 20

## 2017-03-23 MED ORDER — ACETAMINOPHEN 500 MG PO TABS
500.0000 mg | ORAL_TABLET | Freq: Four times a day (QID) | ORAL | Status: DC | PRN
  Administered 2017-03-31 – 2017-04-03 (×3): 500 mg via ORAL
  Filled 2017-03-23 (×5): qty 1

## 2017-03-23 MED ORDER — HEPARIN BOLUS VIA INFUSION
3000.0000 [IU] | Freq: Once | INTRAVENOUS | Status: AC
  Administered 2017-03-23: 3000 [IU] via INTRAVENOUS
  Filled 2017-03-23: qty 3000

## 2017-03-23 MED ORDER — ORAL CARE MOUTH RINSE
15.0000 mL | Freq: Two times a day (BID) | OROMUCOSAL | Status: DC
  Administered 2017-03-23 – 2017-04-04 (×22): 15 mL via OROMUCOSAL

## 2017-03-23 MED ORDER — HEPARIN (PORCINE) IN NACL 100-0.45 UNIT/ML-% IJ SOLN
1000.0000 [IU]/h | INTRAMUSCULAR | Status: DC
  Administered 2017-03-23: 1000 [IU]/h via INTRAVENOUS
  Filled 2017-03-23: qty 250

## 2017-03-23 MED ORDER — MAGNESIUM SULFATE 2 GM/50ML IV SOLN
2.0000 g | Freq: Once | INTRAVENOUS | Status: AC
  Administered 2017-03-23: 2 g via INTRAVENOUS
  Filled 2017-03-23: qty 50

## 2017-03-23 MED ORDER — HYDRALAZINE HCL 50 MG PO TABS
50.0000 mg | ORAL_TABLET | Freq: Three times a day (TID) | ORAL | Status: DC
  Administered 2017-03-23 – 2017-03-24 (×3): 50 mg via ORAL
  Filled 2017-03-23 (×3): qty 1

## 2017-03-23 MED ORDER — LEVALBUTEROL HCL 1.25 MG/0.5ML IN NEBU: 1.2500 mg | INHALATION_SOLUTION | Freq: Four times a day (QID) | RESPIRATORY_TRACT | Status: DC | PRN

## 2017-03-23 MED ORDER — HYDRALAZINE HCL 25 MG PO TABS: 25.0000 mg | ORAL_TABLET | Freq: Three times a day (TID) | ORAL | Status: DC

## 2017-03-23 MED ORDER — HEPARIN (PORCINE) IN NACL 100-0.45 UNIT/ML-% IJ SOLN
750.0000 [IU]/h | INTRAMUSCULAR | Status: DC
  Administered 2017-03-24: 750 [IU]/h via INTRAVENOUS

## 2017-03-23 NOTE — Progress Notes (Addendum)
Progress Note  Patient Name: Jocelyn Hill. Carradine Date of Encounter: 03/23/2017  Primary Cardiologist:   Dr. Bettina Gavia  Subjective   She is breathing better but not at baseline.  Mild chest soreness.   Inpatient Medications    Scheduled Meds: . amLODipine  10 mg Oral Daily  . aspirin  81 mg Per Tube Daily  . docusate sodium  100 mg Oral BID  . furosemide  80 mg Intravenous Q8H  . heparin subcutaneous  5,000 Units Subcutaneous Q8H  . heparin  5,000 Units Intravenous Once  . hydrALAZINE  15 mg Intravenous Q6H  . insulin aspart  0-15 Units Subcutaneous TID WC  . levalbuterol  1.25 mg Nebulization TID  . levothyroxine  112 mcg Per Tube QAC breakfast  . mouth rinse  15 mL Mouth Rinse BID  . pantoprazole  40 mg Oral Daily  . potassium chloride  20 mEq Oral BID  . sodium chloride flush  10-40 mL Intracatheter Q12H  . sodium chloride flush  3 mL Intravenous Q12H  . vitamin C  250 mg Oral BID   Continuous Infusions: . sodium chloride Stopped (03/17/17 1740)  . amiodarone 30 mg/hr (03/23/17 0010)  . ferric gluconate (FERRLECIT/NULECIT) IV     PRN Meds: sodium chloride, acetaminophen **OR** acetaminophen, bisacodyl, hydrALAZINE, LORazepam, morphine, ondansetron (ZOFRAN) IV, sennosides, sodium chloride flush, sodium chloride flush   Vital Signs    Vitals:   03/23/17 0308 03/23/17 0310 03/23/17 0845 03/23/17 0901  BP:  (!) 169/97 (!) 184/83   Pulse:  71 78   Resp:  15 (!) 22   Temp:  98.9 F (37.2 C)  97.6 F (36.4 C)  TempSrc:  Oral  Oral  SpO2:  97% 97%   Weight: 153 lb 3.5 oz (69.5 kg)     Height:        Intake/Output Summary (Last 24 hours) at 03/23/17 1013 Last data filed at 03/23/17 0600  Gross per 24 hour  Intake            367.4 ml  Output             3700 ml  Net          -3332.6 ml   Filed Weights   03/21/17 0448 03/22/17 0417 03/23/17 0308  Weight: 152 lb 5.4 oz (69.1 kg) 159 lb 9.8 oz (72.4 kg) 153 lb 3.5 oz (69.5 kg)    Telemetry    NA - Personally  Reviewed  ECG    NA - Personally Reviewed  Physical Exam   GEN: No acute distress.   Neck: No  JVD Cardiac: RRR, 3/6 holosystolic murmur at the axilla, no diastolic murmurs, rubs, or gallops.  Respiratory:   Decreased breath sounds bilaterally, basilar crackles. GI: Soft, nontender, non-distended  MS: No  edema; No deformity. Neuro:  Nonfocal  Psych: Normal affect   Labs    Chemistry Recent Labs Lab 03/16/17 1403  03/19/17 0316  03/20/17 0418 03/21/17 0444 03/21/17 1345 03/23/17 0546  NA 129*  < > 139  < > 140 136  --  136  K 5.4*  < > 3.9  < > 3.8 3.4* 3.7 3.2*  CL 98*  < > 108  < > 107 103  --  97*  CO2 18*  < > 21*  < > 20* 22  --  31  GLUCOSE 228*  < > 98  < > 133* 165*  --  146*  BUN 63*  < > 56*  < >  55* 51*  --  34*  CREATININE 5.12*  < > 3.51*  < > 3.56* 3.00*  --  2.62*  CALCIUM 7.9*  < > 8.4*  < > 9.6 9.1  --  9.2  PROT 5.4*  --   --   --   --  6.0*  --   --   ALBUMIN 2.9*  < > 3.0*  --  3.2* 3.2*  --   --   AST 395*  --   --   --   --  162*  --   --   ALT 440*  --   --   --   --  677*  --   --   ALKPHOS 81  --   --   --   --  85  --   --   BILITOT 1.4*  --   --   --   --  1.3*  --   --   GFRNONAA 8*  < > 13*  < > 12* 15*  --  18*  GFRAA 9*  < > 15*  < > 14* 18*  --  21*  ANIONGAP 13  < > 10  < > 13 11  --  8  < > = values in this interval not displayed.   Hematology Recent Labs Lab 03/20/17 0406 03/21/17 0444 03/22/17 0914  WBC 11.0* 12.1* 10.6*  RBC 3.60*  3.60* 3.76* 3.79*  HGB 9.3* 9.5* 9.5*  HCT 27.4* 28.3* 28.7*  MCV 76.1* 75.3* 75.7*  MCH 25.8* 25.3* 25.1*  MCHC 33.9 33.6 33.1  RDW 14.0 14.1 14.1  PLT 234 243 222    Cardiac Enzymes Recent Labs Lab 03/16/17 1403 03/19/17 1334 03/19/17 1930 03/20/17 0100  TROPONINI 0.11* 0.10* 0.11* 0.11*   No results for input(s): TROPIPOC in the last 168 hours.   BNPNo results for input(s): BNP, PROBNP in the last 168 hours.   DDimer No results for input(s): DDIMER in the last 168 hours.    Radiology    Dg Chest Port 1 View  Result Date: 03/23/2017 CLINICAL DATA:  Follow-up bilateral alveolar opacities. EXAM: PORTABLE CHEST 1 VIEW COMPARISON:  Portable chest x-ray of March 22, 2017 FINDINGS: The lungs are adequately inflated. Bibasilar parenchymal consolidation persists. The interstitial markings elsewhere are mildly prominent. The cardiac silhouette is enlarged. The pulmonary vascularity is engorged. There is calcification in the wall of the aortic arch. The right internal jugular Cordis sheath tip projects over the proximal SVC. There is a kink in this catheter as it enters the scan. IMPRESSION: Bibasilar pneumonia. Small bilateral pleural effusions. Parenchymal consolidation at the right base has increased since yesterday's study. Mild CHF. Thoracic aortic atherosclerosis. Electronically Signed   By: David  Martinique M.D.   On: 03/23/2017 07:37   Dg Chest Port 1 View  Result Date: 03/22/2017 CLINICAL DATA:  Increasing shortness of breath EXAM: PORTABLE CHEST 1 VIEW COMPARISON:  March 21, 2017 FINDINGS: No pneumothorax. Interstitial opacities throughout the left lung are stable. Effusion and opacity in the right base is mildly improved. Focal infiltrate in the left retrocardiac region is unchanged. No other interval changes. IMPRESSION: Persistent pulmonary opacities as above.  Suggested small effusions. Electronically Signed   By: Dorise Bullion III M.D   On: 03/22/2017 10:22    Cardiac Studies   ECHO:    Study Conclusions  - Left ventricle: The cavity size was normal. Wall thickness was normal. Systolic function was normal. The estimated ejection fraction was  in the range of 60% to 65%. Wall motion was normal; there were no regional wall motion abnormalities. Features are consistent with a pseudonormal left ventricular filling pattern, with concomitant abnormal relaxation and increased filling pressure (grade 2 diastolic dysfunction). - Mitral valve:  There was severe regurgitation. - Left atrium: The atrium was severely dilated. - Right atrium: The atrium was mildly dilated. - Tricuspid valve: There was mild-moderate regurgitation. - Pulmonary arteries: Systolic pressure was moderately increased. PA peak pressure: 60 mm Hg (S). - Pericardium, extracardiac: A small to moderate pericardial effusion was identified. There was no evidence of hemodynamic compromise.  Patient Profile     66 y.o. female severe mitral regurgitation, severely dilated left atrium, moderately elevated pulmonary artery pressures, normal left ventricular ejection fraction, chronic kidney disease with acute hypoxia, acute hypotension requiring pressors, acute bradycardia.    Assessment & Plan    MR:  Severe and symptomatic.  (Stage D)    Eventual valve replacement.  Diuresing with good daily output.  (4 liters yesterday.)  Creat seems to be tolerating this.  Continue current diuresis.    ACUTE ON CHRONIC DIASTOLIC HF:  Down 10 liters.  As above.  PAF:  Now NSR on IV amiodarone.  I will continue this.    ELEVATED TROPONIN:  Eventual cath.    BRADYCARDIA:  HR tolerating amio but avoiding beta blockers  HTN:    Increase hydralazine.    ACUTE CKD STAGE IV:  Creat is improved.  Follow.  No indication for dialysis.     Signed, Minus Breeding, MD  03/23/2017, 10:13 AM

## 2017-03-23 NOTE — Progress Notes (Signed)
  Speech Language Pathology Treatment: Dysphagia  Patient Details Name: Jocelyn Hill MRN: [id number] DOB: 26-Nov-1950 Today's Date: 03/23/2017 Time: 7290-2111 SLP Time Calculation (min) (ACUTE ONLY): 21 min  Assessment / Plan / Recommendation Clinical Impression  Pt shows no overt signs of aspiration, but continues to have similar complaints of foods not "going down" well. Between suspected esophageal component and condition of dentition, pt prefers to stay on her current chopped diet. SLP provided Min cues for need to sit more upright during intake and assisted with repositioning. Education was also reinforced about esophageal precautions. Will continue to follow to determine if diet upgrades are possible at this time. It may be that she needs to have further esophageal w/u (acutely or on an OP basis as long as her intake is sufficient) and dental consult prior to trying a more solid diet.    HPI HPI: Jocelyn Hill a 66 y.o.femalewith medical history significant for poorly controlled hypertension, GERD, stage III chronic kidney disease, bronchitis, diabetes mellitus, peripheral neuropathy and hypothyroidism admitted with shortness of breath. Found to have a BNP of 1700 with a new diagnosis of CHF. CXR continued bilateral effusions, right greater the left with bibasilar atelectasis or infiltrates.  Per MD note her chest pressure/ache is described as located midsternally with some radiation in all direction and  feeling like something is stuck in her throat. Pt coded 03/24/23 and intubated and extubated 9/19. BSE 03/13/17 with suspected esophageal dysphagia (sope with GI 10 yrs ago), recommended to follow up with GI as outpatient, regular and thin recommended. Recent dental extraction (upper).      SLP Plan  Continue with current plan of care       Recommendations  Diet recommendations: Dysphagia 2 (fine chop);Thin liquid Liquids provided via: Straw;Cup Medication Administration: Whole meds  with liquid Supervision: Patient able to self feed Compensations: Slow rate;Small sips/bites;Follow solids with liquid Postural Changes and/or Swallow Maneuvers: Seated upright 90 degrees;Upright 30-60 min after meal                Oral Care Recommendations: Oral care QID (given condition of dentition) Follow up Recommendations: None SLP Visit Diagnosis: Dysphagia, unspecified (R13.10) Plan: Continue with current plan of care       GO                Germain Osgood 03/23/2017, 1:47 PM  Germain Osgood, M.A. CCC-SLP [phone]

## 2017-03-23 NOTE — Progress Notes (Signed)
ANTICOAGULATION CONSULT NOTE - Follow Up Consult  Pharmacy Consult for heparin Indication: atrial fibrillation  Labs:  Recent Labs  03/21/17 0444 03/22/17 0914 03/23/17 0546 03/23/17 2205  HGB 9.5* 9.5*  --   --   HCT 28.3* 28.7*  --   --   PLT 243 222  --   --   HEPARINUNFRC  --   --   --  1.20*  CREATININE 3.00*  --  2.62*  --     Assessment: 66yo female above goal on heparin with initial dosing for Afib.  Goal of Therapy:  Heparin level 0.3-0.7 units/ml   Plan:  Will hold heparin gtt x51min then resume at lower rate of 750 units/hr and check level in [place], PharmD, BCPS  03/23/2017,11:55 PM

## 2017-03-23 NOTE — Progress Notes (Signed)
      [address]       [place] [address]             [phone]     CARDIOTHORACIC SURGERY PROGRESS NOTE  7 Days Post-Op  S/P Procedure(s) (LRB): Temporary Pacemaker (N/A)  Subjective: Looks a little better.  Currently alert.  Remains SOB at rest  Objective: Vital signs in last 24 hours: Temp:  [97.6 F (36.4 C)-98.9 F (37.2 C)] 97.6 F (36.4 C) (09/24 0901) Pulse Rate:  [71-78] 78 (09/24 0845) Cardiac Rhythm: Normal sinus rhythm (09/24 0700) Resp:  [15-25] 22 (09/24 0845) BP: (160-184)/(76-97) 184/83 (09/24 0845) SpO2:  [95 %-99 %] 97 % (09/24 0845) FiO2 (%):  [55 %] 55 % (09/23 2319) Weight:  [153 lb 3.5 oz (69.5 kg)] 153 lb 3.5 oz (69.5 kg) (09/24 0308)  Physical Exam:  Rhythm:   sinus  Breath sounds: Diminished at right base  Heart sounds:  RRR w/ holosystolic murmur  Incisions:  n/a  Abdomen:  soft  Extremities:  warm   Intake/Output from previous day: 09/23 0701 - 09/24 0700 In: 770.8 [P.O.:370; I.V.:400.8] Out: 4700 [Urine:4700] Intake/Output this shift: No intake/output data recorded.  Lab Results:  Recent Labs  03/21/17 0444 03/22/17 0914  WBC 12.1* 10.6*  HGB 9.5* 9.5*  HCT 28.3* 28.7*  PLT 243 222   BMET:  Recent Labs  03/21/17 0444 03/21/17 1345 03/23/17 0546  NA 136  --  136  K 3.4* 3.7 3.2*  CL 103  --  97*  CO2 22  --  31  GLUCOSE 165*  --  146*  BUN 51*  --  34*  CREATININE 3.00*  --  2.62*  CALCIUM 9.1  --  9.2    CBG (last 3)   Recent Labs  03/22/17 1253 03/22/17 1716 03/22/17 2143  GLUCAP 163* 143* 178*   PT/INR:  No results for input(s): LABPROT, INR in the last 72 hours.  CXR:  PORTABLE CHEST 1 VIEW  COMPARISON:  Portable chest x-ray of March 22, 2017  FINDINGS: The lungs are adequately inflated. Bibasilar parenchymal consolidation persists. The interstitial markings elsewhere are mildly prominent. The cardiac silhouette is enlarged. The pulmonary vascularity is engorged. There is  calcification in the wall of the aortic arch. The right internal jugular Cordis sheath tip projects over the proximal SVC. There is a kink in this catheter as it enters the scan.  IMPRESSION: Bibasilar pneumonia. Small bilateral pleural effusions. Parenchymal consolidation at the right base has increased since yesterday's study.  Mild CHF.  Thoracic aortic atherosclerosis.   Electronically Signed   By: David  Martinique M.D.   On: 03/23/2017 07:37  Assessment/Plan: S/P Procedure(s) (LRB): Temporary Pacemaker (N/A)  It remains unclear whether or not Ms Mallinger can improve enough to be considered a candidate for high risk MVR with or without maze procedure, but she remains stable and has improved a bit over the weekend.  As I mentioned a week ago, I think she would benefit from right thoracentesis.  Will ask the advanced CHF team to get involved with her care.  At some point she will need left and right heart cath.  Will ask for dental service consult as well.  I spent in excess of 15 minutes during the conduct of this hospital encounter and >50% of this time involved direct face-to-face encounter with the patient for counseling and/or coordination of their care.   Rexene Alberts, MD 03/23/2017 10:19 AM

## 2017-03-23 NOTE — Care Management Important Message (Signed)
Important Message  Patient Details  Name: Jocelyn Hill MRN: [id number] Date of Birth: 1951/01/12   Medicare Important Message Given:  Yes    Stanton Kissoon Abena 03/23/2017, 10:04 AM

## 2017-03-23 NOTE — Progress Notes (Signed)
[facility] Progress Note  Subjective: Had 4700 of urine yest- neg 4000- got one dose lasix 160 , then 80 q 8 hours- also crt down to 2.6   Vitals:   03/23/17 0308 03/23/17 0310 03/23/17 0845 03/23/17 0901  BP:  (!) 169/97 (!) 184/83   Pulse:  71 78   Resp:  15 (!) 22   Temp:  98.9 F (37.2 C)  97.6 F (36.4 C)  TempSrc:  Oral  Oral  SpO2:  97% 97%   Weight: 69.5 kg (153 lb 3.5 oz)     Height:        Inpatient medications: . amLODipine  10 mg Oral Daily  . aspirin  81 mg Per Tube Daily  . docusate sodium  100 mg Oral BID  . ferrous sulfate  325 mg Oral BID WC  . furosemide  80 mg Intravenous Q8H  . heparin subcutaneous  5,000 Units Subcutaneous Q8H  . heparin  5,000 Units Intravenous Once  . hydrALAZINE  15 mg Intravenous Q6H  . insulin aspart  0-15 Units Subcutaneous TID WC  . levalbuterol  1.25 mg Nebulization TID  . levothyroxine  112 mcg Per Tube QAC breakfast  . mouth rinse  15 mL Mouth Rinse BID  . pantoprazole  40 mg Oral Daily  . sodium chloride flush  10-40 mL Intracatheter Q12H  . sodium chloride flush  3 mL Intravenous Q12H  . vitamin C  250 mg Oral BID   . sodium chloride Stopped (03/17/17 0277)  . amiodarone 30 mg/hr (03/23/17 0010)   sodium chloride, acetaminophen **OR** acetaminophen, bisacodyl, hydrALAZINE, LORazepam, morphine, ondansetron (ZOFRAN) IV, sennosides, sodium chloride flush, sodium chloride flush  Exam: On nasal O2, not in distress Chest rales R base, L clear RRR no RG, 3/6 sem Abd soft ntnd no ascites Ext no LE edema NF, follows commands  Home meds :   - norvasc/ hydralazine 100 tid/ labetalol 200 bid/ minoxidil 2.5 qd - albuterol/ asa/ lipitor/ T4 / singulair/ PPI / KCl  - wellbutrin / cymbalta/ neurontin/ Lyrica / ambien prn / oxy prn   CXR 9/23 diffuse edema  Impression: 1.  Acute on CKD 4 - improving creat but worsening vol status w/ pulm edema.  Needs heart cath and valve replacement; with CKD stage IV  bordering stage V needed higher doses of lasix in order to stabilize her first.  Seems to be responding.   they are agreeable to dialysis if needed this week but does not appear is needed at this time. 2.  Pulm edema - as above- down to 6 liters [place] from mask 3.  Severe MR - per cards 4.  HTN - bp's high, vol excess- also on norvasc 10 and hydralazine 15 q 6 in addition to lasix 80 q 8- no change for now 5.  Hx CVA/ depression/ chronic pain 6. Anemia- will give iron 7. Hypokalemia- replete      Alexandra Posadas A  03/23/2017, 9:30 AM    Recent Labs Lab 03/19/17 0316  03/20/17 0418 03/21/17 0444 03/21/17 1345 03/23/17 0546  NA 139  < > 140 136  --  136  K 3.9  < > 3.8 3.4* 3.7 3.2*  CL 108  < > 107 103  --  97*  CO2 21*  < > 20* 22  --  31  GLUCOSE 98  < > 133* 165*  --  146*  BUN 56*  < > 55* 51*  --  34*  CREATININE 3.51*  < >  3.56* 3.00*  --  2.62*  CALCIUM 8.4*  < > 9.6 9.1  --  9.2  PHOS 5.6*  --  5.1* 3.4  --   --   < > = values in this interval not displayed.  Recent Labs Lab 03/16/17 1403  03/19/17 0316 03/20/17 0418 03/21/17 0444  AST 395*  --   --   --  162*  ALT 440*  --   --   --  677*  ALKPHOS 81  --   --   --  85  BILITOT 1.4*  --   --   --  1.3*  PROT 5.4*  --   --   --  6.0*  ALBUMIN 2.9*  < > 3.0* 3.2* 3.2*  < > = values in this interval not displayed.  Recent Labs Lab 03/20/17 0406 03/21/17 0444 03/22/17 0914  WBC 11.0* 12.1* 10.6*  NEUTROABS  --   --  8.8*  HGB 9.3* 9.5* 9.5*  HCT 27.4* 28.3* 28.7*  MCV 76.1* 75.3* 75.7*  PLT 234 243 222   Iron/TIBC/Ferritin/ %Sat    Component Value Date/Time   IRON 62 03/20/2017 0406   TIBC 256 03/20/2017 0406   FERRITIN 148 03/20/2017 0406   IRONPCTSAT 24 03/20/2017 0406

## 2017-03-23 NOTE — Consult Note (Signed)
Advanced Heart Failure Team Consult Note  Primary Cardiologist:  Dr. Bettina Gavia Primary HF: New (Dr. Aundra Dubin)  Reason for Consultation: Acute on chronic diastolic CHF with severe MR.   HPI:    Jocelyn Hill is seen today for evaluation of acute on chronic diastolic CHF and severe MR at the request of Dr. Roxy Manns.   Jocelyn Hill is a 66 y.o. female with h/o MR, Diastolic CHF, Afib, CAD s/p MI, long standing HTN, DM2, CKD IV-V, h/o Stroke. Anxiety, depression and GERD.  Per pt has had 2 MIs treated at [facility] in [place] several years ago.   Pt presented to [facility] in acute resp distress 03/12/17. Noted to have poorly controlled HTN. CXR with pulmonary edema and BNP 2230.5. Transferred to [facility] for further evaluation. Echo as below with severe MR. Pt diuresed aggressively with good response initially, but decompensated and went into hypoxic respiratory failure requiring intubation 03/16/17.  She was able to be weaned. Creatinine peaked at 5.0 and gradually back down. Noted to go into Afib RVR 03/20/17, started on amio drip.   Feeling somewhat better this am. Remains SOB and having mild pressure in her chest. Denies lightheadedness or dizziness.  She felt her USOH 1 month again and has had gradual decline since. Denies medication non-compliance or recent illness. She was smoking 5-6 cigarettes daily PTA. Denies ETOH use.  At baseline is not SOB with her normal activities. She is relatively mobile. Drives and runs errands on her own.   Negative 3.9 L yesterday. Weight down 6 lbs. Creatinine 3.00 -> 2.62 over 2 days ( No BMET 03/22/17)  TTE 03/12/17 LVEF 60-65%, Grade 2 DD, Severe MR, Severe LAE, Mild RAE, Mild/Mod TR, PA peak pressure 60 mm hg, small/mod pericardial effusion.  TEE 03/17/17 LVEF 55-60%, Trivial TR, Severe MR, mild LAE, mild mod TR. No evidence of vegetation  Review of Systems: [y] = yes, [ ]  = no   General: Weight gain [ ] ; Weight loss [ ] ; Anorexia [ ] ;  Fatigue [ ] ; Fever [ ] ; Chills [ ] ; Weakness [ ]   Cardiac: Chest pain/pressure [ ] ; Resting SOB [ ] ; Exertional SOB [ ] ; Orthopnea [ ] ; Pedal Edema [ ] ; Palpitations [ ] ; Syncope [ ] ; Presyncope [ ] ; Paroxysmal nocturnal dyspnea[ ]   Pulmonary: Cough [ ] ; Wheezing[ ] ; Hemoptysis[ ] ; Sputum [ ] ; Snoring [ ]   GI: Vomiting[ ] ; Dysphagia[ ] ; Melena[ ] ; Hematochezia [ ] ; Heartburn[ ] ; Abdominal pain [ ] ; Constipation [ ] ; Diarrhea [ ] ; BRBPR [ ]   GU: Hematuria[ ] ; Dysuria [ ] ; Nocturia[ ]   Vascular: Pain in legs with walking [ ] ; Pain in feet with lying flat [ ] ; Non-healing sores [ ] ; Stroke [ ] ; TIA [ ] ; Slurred speech [ ] ;  Neuro: Headaches[ ] ; Vertigo[ ] ; Seizures[ ] ; Paresthesias[ ] ;Blurred vision [ ] ; Diplopia [ ] ; Vision changes [ ]   Ortho/Skin: Arthritis [ ] ; Joint pain [ ] ; Muscle pain [ ] ; Joint swelling [ ] ; Back Pain [ ] ; Rash [ ]   Psych: Depression[ ] ; Anxiety[ ]   Heme: Bleeding problems [ ] ; Clotting disorders [ ] ; Anemia [ ]   Endocrine: Diabetes [ ] ; Thyroid dysfunction[ ]   Home Medications Prior to Admission medications   Medication Sig Start Date End Date Taking? Authorizing Provider  albuterol (PROVENTIL HFA;VENTOLIN HFA) 108 (90 Base) MCG/ACT inhaler Inhale 2 puffs into the lungs every 4 (four) hours as needed for wheezing or shortness of breath.   Yes [provider]  amLODipine (NORVASC) 10 MG tablet Take 10 mg by mouth daily.   Yes [provider]  aspirin EC 81 MG tablet Take 81 mg by mouth daily.   Yes [provider]  atorvastatin (LIPITOR) 40 MG tablet Take 40 mg by mouth daily.   Yes [provider]  b complex vitamins capsule Take 1 capsule by mouth daily.   Yes [provider]  buPROPion (WELLBUTRIN XL) 300 MG 24 hr tablet Take 300 mg by mouth daily.   Yes [provider]  cholecalciferol (VITAMIN D) 1000 units tablet Take 1,000 Units by mouth daily.   Yes [provider]  DULoxetine (CYMBALTA) 30 MG capsule  Take 30 mg by mouth daily.   Yes [provider]  gabapentin (NEURONTIN) 300 MG capsule Take 300 mg by mouth at bedtime.   Yes [provider]  hydrALAZINE (APRESOLINE) 100 MG tablet Take 100 mg by mouth 3 (three) times daily.   Yes [provider]  labetalol (NORMODYNE) 200 MG tablet Take 200 mg by mouth 2 (two) times daily.   Yes [provider]  levothyroxine (SYNTHROID, LEVOTHROID) 112 MCG tablet Take 112 mcg by mouth daily before breakfast.   Yes [provider]  minoxidil (LONITEN) 2.5 MG tablet Take by mouth daily.   Yes [provider]  montelukast (SINGULAIR) 10 MG tablet Take 10 mg by mouth daily.   Yes [provider]  Oxycodone HCl 10 MG TABS Take 10 mg by mouth 3 (three) times daily as needed (pain).   Yes [provider]  pantoprazole (PROTONIX) 40 MG tablet Take 40 mg by mouth daily.   Yes [provider]  potassium gluconate (HM POTASSIUM) 595 (99 K) MG TABS tablet Take 595 mg by mouth daily.   Yes [provider]  pregabalin (LYRICA) 150 MG capsule Take 150 mg by mouth daily.   Yes [provider]  tiZANidine (ZANAFLEX) 4 MG capsule Take 4 mg by mouth 3 (three) times daily.   Yes [provider]  zolpidem (AMBIEN) 10 MG tablet Take 10 mg by mouth at bedtime as needed for sleep.   Yes [provider]  busPIRone (BUSPAR) 15 MG tablet Take 15 mg by mouth 2 (two) times daily.    [provider]    Past Medical History: Past Medical History:  Diagnosis Date  . Anxiety   . Arthritis   . Bronchitis   . Depression   . Diabetes mellitus without complication ([place])   . Diet-controlled diabetes mellitus ([place])   . GERD (gastroesophageal reflux disease)   . Hyperlipidemia   . Hypertension   . Hypothyroidism   . Stroke [facility])     Past Surgical History: Past Surgical History:  Procedure Laterality Date  . ABDOMINAL HYSTERECTOMY     PARTIALS  .  TONSILLECTOMY    . TUBAL LIGATION      Family History: History reviewed. No pertinent family history.  Social History: Social History   Social History  . Marital status: Divorced    Spouse name: N/A  . Number of children: N/A  . Years of education: N/A   Social History Main Topics  . Smoking status: Current Some Day Smoker    Types: Cigarettes  . Smokeless tobacco: Never Used  . Alcohol use No  . Drug use: No  . Sexual activity: No   Other Topics Concern  . None   Social History Narrative  . None    Allergies:  Allergies  Allergen Reactions  . Ace Inhibitors Anaphylaxis and Swelling    Objective:    Vital Signs:   Temp:  [97.6 F (36.4 C)-98.9 F (37.2 C)] 97.6 F (36.4 C) (09/24 0901) Pulse Rate:  [71-78] 78 (09/24 0845) Resp:  [15-25] 22 (09/24 0845) BP: (160-184)/(76-97) 184/83 (09/24 0845) SpO2:  [95 %-99 %] 97 % (09/24 0845) FiO2 (%):  [55 %] 55 % (09/23 2319) Weight:  [153 lb 3.5 oz (69.5 kg)] 153 lb 3.5 oz (69.5 kg) (09/24 0308) Last BM Date: 03/22/17  Weight change: Filed Weights   03/21/17 0448 03/22/17 0417 03/23/17 0308  Weight: 152 lb 5.4 oz (69.1 kg) 159 lb 9.8 oz (72.4 kg) 153 lb 3.5 oz (69.5 kg)    Intake/Output:   Intake/Output Summary (Last 24 hours) at 03/23/17 0935 Last data filed at 03/23/17 0600  Gross per 24 hour  Intake            637.4 ml  Output             3700 ml  Net          -3062.6 ml      Physical Exam    General:  Chronically ill appearing. No resp difficulty HEENT: normal Neck: supple. JVP 6-7. R subclavian. Carotids 2+ bilat; no bruits. No lymphadenopathy or thyromegaly appreciated. Cor: PMI nondisplaced. Regular with frequent ectopy. Severe murmur.  Lungs: Clear anteriorly, diminished basilar sounds.  Abdomen: soft, nontender, nondistended. No hepatosplenomegaly. No bruits or masses. Good bowel sounds. Extremities: no cyanosis, clubbing, or rash. Trace ankle edema. Warm. SCDs in place.  Neuro: alert &  orientedx3, cranial nerves grossly intact. moves all 4 extremities w/o difficulty. Affect pleasant   Telemetry   NSR with frequent PVCs overnight, Personally reviewed  EKG    Aflutter with RVR 03/21/17. Personally reviewed.  Labs   Basic Metabolic Panel:  Recent Labs Lab 03/17/17 0324 03/18/17 0346 03/19/17 0316 03/19/17 1930 03/20/17 0418 03/21/17 0444 03/21/17 1345 03/22/17 0914 03/23/17 0546  NA 132* 136 139 136 140 136  --   --  136  K 4.3 4.1 3.9 3.6 3.8 3.4* 3.7  --  3.2*  CL 101 102 108 107 107 103  --   --  97*  CO2 21* 23 21* 21* 20* 22  --   --  31  GLUCOSE 99 96 98 130* 133* 165*  --   --  146*  BUN 65* 66* 56* 54* 55* 51*  --   --  34*  CREATININE 5.00* 4.47* 3.51* 3.57* 3.56* 3.00*  --   --  2.62*  CALCIUM 8.2* 8.6* 8.4* 8.8* 9.6 9.1  --   --  9.2  MG 2.4 2.3 2.1 2.1  --   --  2.0 1.9 1.7  PHOS 6.3* 5.7* 5.6*  --  5.1* 3.4  --   --   --     Liver Function Tests:  Recent Labs Lab 03/16/17 1403 03/17/17 0324 03/18/17 0346 03/19/17 0316 03/20/17 0418 03/21/17 0444  AST 395*  --   --   --   --  162*  ALT 440*  --   --   --   --  677*  ALKPHOS 81  --   --   --   --  85  BILITOT 1.4*  --   --   --   --  1.3*  PROT 5.4*  --   --   --   --  6.0*  ALBUMIN 2.9* 3.2*  3.1* 3.0* 3.2* 3.2*   No results for input(s): LIPASE, AMYLASE in the last 168 hours. No results for input(s): AMMONIA in the last 168 hours.  CBC:  Recent Labs Lab 03/18/17 0346 03/19/17 0316 03/20/17 0406 03/21/17 0444 03/22/17 0914  WBC 10.0 9.2 11.0* 12.1* 10.6*  NEUTROABS  --   --   --   --  8.8*  HGB 9.7* 9.4* 9.3* 9.5* 9.5*  HCT 28.1* 27.5* 27.4* 28.3* 28.7*  MCV 74.7* 75.5* 76.1* 75.3* 75.7*  PLT 226 229 234 243 222    Cardiac Enzymes:  Recent Labs Lab 03/16/17 1403 03/19/17 1334 03/19/17 1930 03/20/17 0100  TROPONINI 0.11* 0.10* 0.11* 0.11*    BNP: BNP (last 3 results)  Recent Labs  03/12/17 1400  BNP 2,230.5*    ProBNP (last 3 results) No results  for input(s): PROBNP in the last 8760 hours.   CBG:  Recent Labs Lab 03/21/17 2106 03/22/17 0813 03/22/17 1253 03/22/17 1716 03/22/17 2143  GLUCAP 143* 147* 163* 143* 178*    Coagulation Studies: No results for input(s): LABPROT, INR in the last 72 hours.   Imaging   Dg Chest Port 1 View  Result Date: 03/23/2017 CLINICAL DATA:  Follow-up bilateral alveolar opacities. EXAM: PORTABLE CHEST 1 VIEW COMPARISON:  Portable chest x-ray of March 22, 2017 FINDINGS: The lungs are adequately inflated. Bibasilar parenchymal consolidation persists. The interstitial markings elsewhere are mildly prominent. The cardiac silhouette is enlarged. The pulmonary vascularity is engorged. There is calcification in the wall of the aortic arch. The right internal jugular Cordis sheath tip projects over the proximal SVC. There is a kink in this catheter as it enters the scan. IMPRESSION: Bibasilar pneumonia. Small bilateral pleural effusions. Parenchymal consolidation at the right base has increased since yesterday's study. Mild CHF. Thoracic aortic atherosclerosis. Electronically Signed   By: David  Martinique M.D.   On: 03/23/2017 07:37   Dg Chest Port 1 View  Result Date: 03/22/2017 CLINICAL DATA:  Increasing shortness of breath EXAM: PORTABLE CHEST 1 VIEW COMPARISON:  March 21, 2017 FINDINGS: No pneumothorax. Interstitial opacities throughout the left lung are stable. Effusion and opacity in the right base is mildly improved. Focal infiltrate in the left retrocardiac region is unchanged. No other interval changes. IMPRESSION: Persistent pulmonary opacities as above.  Suggested small effusions. Electronically Signed   By: Dorise Bullion III M.D   On: 03/22/2017 10:22      Medications:     Current Medications: . amLODipine  10 mg Oral Daily  . aspirin  81 mg Per Tube Daily  . docusate sodium  100 mg Oral BID  . ferrous sulfate  325 mg Oral BID WC  . furosemide  80 mg Intravenous Q8H  . heparin  subcutaneous  5,000 Units Subcutaneous Q8H  . heparin  5,000 Units Intravenous Once  . hydrALAZINE  15 mg Intravenous Q6H  . insulin aspart  0-15 Units Subcutaneous TID WC  . levalbuterol  1.25 mg Nebulization TID  . levothyroxine  112 mcg Per Tube QAC breakfast  . mouth rinse  15 mL Mouth Rinse BID  . pantoprazole  40 mg Oral Daily  . sodium chloride flush  10-40 mL Intracatheter Q12H  . sodium chloride flush  3 mL Intravenous Q12H  . vitamin C  250 mg Oral BID     Infusions: . sodium chloride Stopped (03/17/17 8242)  . amiodarone 30 mg/hr (03/23/17 0010)       Patient Profile   Jocelyn Hill is a  66 y.o. female with h/o MR, Diastolic CHF, Afib, CAD s/p MI, long standing HTN, DM2, CKD IV-V, h/o Stroke. Anxiety, depression and GERD.   Pt admitted 03/12/17 with acute hypoxemic respiratory failure and CHF. HF team consulted to optimized prior to MVR consideration.   Assessment/Plan   1. 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  - Dr. Roxy Manns recommends treatment of CHF, Afib, ARF with trialysis cath placement, cardiac cath, and dental service consultation prior to surgery consideration.  2. Acute on chronic diastolic CHF - Diuresed well yesterday with IV lasix 160 mg. Transitioned to lasix  80 mg q 8 hrs per renal yesterday evening.  - CVP 6-7 on my check. Continue IV lasix for UO stimulation per renal.  3. PAF/Flutter - Remains on amio 30 mg/hr. - She appears to be in NSR with frequent PVCs this am.   - This patients CHA2DS2-VASc Score is at least 7 CHF, HTN, Age, DM, Stroke, and female)  4. CAD s/p MI x 2. - Pt states last MI was 2012-2013. She has not had ischemic work up since then to her recollection.  - Trop peak to 0.11. Likely from demand ischemia from CHF, but needs cath once stabilized prior to MVR consideration. 5. Bradycardia - Rates stable in 70s. Continue to follow.  - s/p temp pacemaker 6. ARF - Renal following.  Attempted HD catheter placement yesterday but unable to obtain access.  - Responding to IV lasix and creatinine trending down.  7. HTN -  Change hydralazine to 50 mg po TID. Keep 10 mg IV prn q 4 hrs for BP > 170.  - Contine norvasc 10 mg daily. 8. Metabolic encephalopathy - Waxes and wanes. Thought to be related to ARF.  9. R pleural effusion - Agree with Dr. Roxy Manns that she would likely benefit from thoracentesis.  10. Hypokalemia - K 3.2 this am. Supp ordered. - Mg 1.7. Will supp with 2 g.   Length of Stay: [place], [place]  03/23/2017, 9:35 AM  Advanced Heart Failure Team Pager [phone] (M-F; 7a - 4p)  Please contact [facility] for night-coverage after hours (4p -7a ) and weekends on [email or web address]  Patient seen with PA, agree with the above note.  1. 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  She will eventually need mitral valve intervention, likely replacement.  Will need to stabilize her as much as possible medically prior to this.  She will need left/right heart cath pre-surgery when creatinine is as stabilized as we can get it.  2. Acute on chronic diastolic CHF: Normal EF on echo with severe MR. She is now diuresing well and CVP down to about 7. On exam, she looks no more than mildly volume overloaded.  Creatinine trending down, 2.6 today.  Renal adjusting her Lasix, decreased to 80 mg IV every 8 hrs today.  Probably can get her on a po regimen tomorrow.  3. Atrial fibrillation: Paroxysmal, in setting of rheumatic mitral valve disease.  Now in NSR.  CHADSVASC = 8 including history of CVA.  She should be on IV heparin gtt for now given need for procedures in the future.  Rate is controlled on amiodarone gtt. Consider Maze with MV surgery.  4. Right pleural effusion: Would recommend thoracentesis.  5. AKI on CKD stage 3: ?Baseline creatinine around 2.1.  Creatinine down to 2.6 today, follow with diuresis.  When creatinine appears to have plateaued, she will  need right/left heart  cath with the understanding that renal function could worsen again with the contrast.  6. HTN: Continue amlodipine, change to po hydralazine (will use 50 mg tid).  7. CAD: MI in past.  Will need coronary angiography prior to MV surgery.  8. Bradycardia: Resolved.  9. Deconditioning: PT consult, up to chair.   [name] 03/23/2017 1:20 PM

## 2017-03-23 NOTE — Progress Notes (Signed)
ANTICOAGULATION CONSULT NOTE - Initial Consult  Pharmacy Consult for heparin Indication: atrial fibrillation  Allergies  Allergen Reactions  . Ace Inhibitors Anaphylaxis and Swelling    Patient Measurements: Height: 5\' 9"  (175.3 cm) Weight: 153 lb 3.5 oz (69.5 kg) IBW/kg (Calculated) : 66.2 Heparin Dosing Weight: 70kg  Vital Signs: Temp: 97.6 F (36.4 C) (09/24 1147) Temp Source: Oral (09/24 1147) BP: 164/72 (09/24 1147) Pulse Rate: 74 (09/24 1147)  Labs:  Recent Labs  03/21/17 0444 03/22/17 0914 03/23/17 0546  HGB 9.5* 9.5*  --   HCT 28.3* 28.7*  --   PLT 243 222  --   CREATININE 3.00*  --  2.62*    Estimated Creatinine Clearance: 22.4 mL/min (A) (by C-G formula based on SCr of 2.62 mg/dL (H)).   Medical History: Past Medical History:  Diagnosis Date  . Anxiety   . Arthritis   . Bronchitis   . Depression   . Diabetes mellitus without complication ([place])   . Diet-controlled diabetes mellitus ([place])   . GERD (gastroesophageal reflux disease)   . Hyperlipidemia   . Hypertension   . Hypothyroidism   . Stroke [facility])     Assessment:  66 y.o. female with h/o MR, Diastolic CHF, Afib, CAD s/p MI, long standing HTN, DM2, CKD IV-V, h/o Stroke. Anxiety, depression and GERD.  Patient with very high Chads2-vasc score of >7, orders to start IV heparin for afib. No immediate plans for cath or procedures but will follow closely.  Goal of Therapy:  Heparin level 0.3-0.7 units/ml Monitor platelets by anticoagulation protocol: Yes   Plan:  Give 3000 units bolus x 1 Start heparin infusion at 1000 units/hr Check anti-Xa level in 8 hours and daily while on heparin Continue to monitor H&H and platelets  Erin Hearing PharmD., BCPS Clinical Pharmacist Pager [phone] 03/23/2017 1:45 PM

## 2017-03-23 NOTE — Progress Notes (Addendum)
Jocelyn Hill  JKD:[id number] DOB: [birth year] DOA: 03/12/2017 PCP: [name], MD    Brief Narrative:  66yo F w/ a hx of anxiety, depression, CVA, poorly controlled HTN, CKD stage III, DM2 with Peripheral Neuropathy, and Hypothyroidism who presented w/ a month of gradually worsening dyspnea.  She initially presented to the ED at [facility] where she was found to have a BNP of 1700.  CXR noted moderate bilateral pleural effusions. Because of worsening creatinine and decompensated CHF, it was felt best to transfer her to [facility].   Subjective: The patient is resting comfortably in bed.  She tells me her breathing is much better this evening than it was this morning.  I spoke to her about a thoracentesis and she agrees to proceed.  She denies current chest pain nausea vomiting or abdominal pain.  Assessment & Plan:  Acute Respiratory failure with Hypoxia due to pulm edema and B Pleural effusions  Steadily improving w/ diuresis - TCTS has suggested a R thoracentesis > order placed   Acute Diastolic CHF CHF Team addressing this issue   CAD - Elevated troponin / demand ischemia Cards following - to have cardiac cath when renal fxn allows   Parox Afib/flutter To consider Maze w/ MVR - Cards following   Severe Mitral Valve regurgitation TCTS following, and they have placed Dental consult   Acute on CKD stage III   Care as per Nephrology - baseline crt ~2.1  Chronic anemia No overt sign of bleeding - likely due to CKD      DM2 9/13 A1c 6.2 - CBG reasonably controlled   Hypothyroidism Cont home synthroid dose   Acute encephalopathy  Multifactorial: hyperperfusion, bradycardia, depression, chest pain - resolved  Hypokalemia Due to diuresis - replace gently and follow   Transamintis Likely hepatic congestion in setting of CHF - check acute hepatitis panel   DVT prophylaxis: IV heparin Code Status: FULL CODE Family  Communication: no family present at time of exam  Disposition Plan:   Consultants:  CHF Team  Cardiology  TCTS PCCM  Procedures: 9/13 TTE - EF 60-65% - grade 2 DD - severe MR - mildly dilated LA and RA 9/18 TEE - severe regurgitation MV  Antimicrobials:  Azithromycin 9/15 > 9/17  Objective: Blood pressure (!) 164/71, pulse 81, temperature 98.3 F (36.8 C), temperature source Oral, resp. rate 18, height 5\' 9"  (1.753 m), weight 69.5 kg (153 lb 3.5 oz), SpO2 100 %.  Intake/Output Summary (Last 24 hours) at 03/23/17 1738 Last data filed at 03/23/17 1403  Gross per 24 hour  Intake            367.4 ml  Output             6900 ml  Net          -6532.6 ml   Filed Weights   03/21/17 0448 03/22/17 0417 03/23/17 0308  Weight: 69.1 kg (152 lb 5.4 oz) 72.4 kg (159 lb 9.8 oz) 69.5 kg (153 lb 3.5 oz)    Examination: General: No acute respiratory distress Lungs: Poor air movement bilateral bases Cardiovascular: Regular rate without gallop or rub Abdomen: Nontender, nondistended, soft, bowel sounds positive, no rebound, no ascites, no appreciable mass Extremities: No significant edema bilateral lower extremities  CBC:  Recent Labs Lab 03/18/17 0346 03/19/17 0316 03/20/17 0406 03/21/17 0444 03/22/17 0914  WBC 10.0 9.2 11.0* 12.1* 10.6*  NEUTROABS  --   --   --   --  8.8*  HGB 9.7* 9.4* 9.3* 9.5* 9.5*  HCT 28.1* 27.5* 27.4* 28.3* 28.7*  MCV 74.7* 75.5* 76.1* 75.3* 75.7*  PLT 226 229 234 243 161   Basic Metabolic Panel:  Recent Labs Lab 03/17/17 0324 03/18/17 0346 03/19/17 0316 03/19/17 1930 03/20/17 0418 03/21/17 0444 03/21/17 1345 03/22/17 0914 03/23/17 0546  NA 132* 136 139 136 140 136  --   --  136  K 4.3 4.1 3.9 3.6 3.8 3.4* 3.7  --  3.2*  CL 101 102 108 107 107 103  --   --  97*  CO2 21* 23 21* 21* 20* 22  --   --  31  GLUCOSE 99 96 98 130* 133* 165*  --   --  146*  BUN 65* 66* 56* 54* 55* 51*  --   --  34*  CREATININE 5.00* 4.47* 3.51* 3.57* 3.56* 3.00*   --   --  2.62*  CALCIUM 8.2* 8.6* 8.4* 8.8* 9.6 9.1  --   --  9.2  MG 2.4 2.3 2.1 2.1  --   --  2.0 1.9 1.7  PHOS 6.3* 5.7* 5.6*  --  5.1* 3.4  --   --   --    GFR: Estimated Creatinine Clearance: 22.4 mL/min (A) (by C-G formula based on SCr of 2.62 mg/dL (H)).  Liver Function Tests:  Recent Labs Lab 03/17/17 0324 03/18/17 0346 03/19/17 0316 03/20/17 0418 03/21/17 0444  AST  --   --   --   --  162*  ALT  --   --   --   --  677*  ALKPHOS  --   --   --   --  85  BILITOT  --   --   --   --  1.3*  PROT  --   --   --   --  6.0*  ALBUMIN 3.2* 3.1* 3.0* 3.2* 3.2*    Coagulation Profile:  Recent Labs Lab 03/17/17 0439  INR 1.53    Cardiac Enzymes:  Recent Labs Lab 03/19/17 1334 03/19/17 1930 03/20/17 0100  TROPONINI 0.10* 0.11* 0.11*    HbA1C: Hgb A1c MFr Bld  Date/Time Value Ref Range Status  03/12/2017 02:00 PM 6.2 (H) 4.8 - 5.6 % Final    Comment:    (NOTE) Pre diabetes:          5.7%-6.4% Diabetes:              >6.4% Glycemic control for   <7.0% adults with diabetes     CBG:  Recent Labs Lab 03/22/17 1253 03/22/17 1716 03/22/17 2143 03/23/17 1142 03/23/17 1709  GLUCAP 163* 143* 178* 141* 207*    Recent Results (from the past 240 hour(s))  MRSA PCR Screening     Status: None   Collection Time: 03/15/17  8:32 PM  Result Value Ref Range Status   MRSA by PCR NEGATIVE NEGATIVE Final    Comment:        The GeneXpert MRSA Assay (FDA approved for NASAL specimens only), is one component of a comprehensive MRSA colonization surveillance program. It is not intended to diagnose MRSA infection nor to guide or monitor treatment for MRSA infections.      Scheduled Meds: . amLODipine  10 mg Oral Daily  . aspirin  81 mg Per Tube Daily  . docusate sodium  100 mg Oral BID  . furosemide  80 mg Intravenous Q8H  . hydrALAZINE  50 mg Oral Q8H  . insulin aspart  0-15 Units Subcutaneous TID WC  . levalbuterol  1.25 mg Nebulization TID  . levothyroxine   112 mcg Per Tube QAC breakfast  . mouth rinse  15 mL Mouth Rinse BID  . pantoprazole  40 mg Oral Daily  . potassium chloride  20 mEq Oral BID  . sodium chloride flush  10-40 mL Intracatheter Q12H  . sodium chloride flush  3 mL Intravenous Q12H  . vitamin C  250 mg Oral BID     LOS: 11 days   Cherene Altes, MD [facility] Office  [phone] Pager - Text Page per Shea Evans as per below:  On-Call/Text Page:      [email or web address]      password [email or web address]  If 7PM-7AM, please contact night-coverage [email or web address] Password [email or web address] 03/23/2017, 5:38 PM

## 2017-03-24 ENCOUNTER — Inpatient Hospital Stay (HOSPITAL_COMMUNITY): Payer: Medicare Other

## 2017-03-24 ENCOUNTER — Encounter (HOSPITAL_COMMUNITY): Payer: Self-pay | Admitting: General Surgery

## 2017-03-24 DIAGNOSIS — D62 Acute posthemorrhagic anemia: Secondary | ICD-10-CM

## 2017-03-24 DIAGNOSIS — I480 Paroxysmal atrial fibrillation: Secondary | ICD-10-CM

## 2017-03-24 DIAGNOSIS — R58 Hemorrhage, not elsewhere classified: Secondary | ICD-10-CM

## 2017-03-24 HISTORY — PX: IR THORACENTESIS ASP PLEURAL SPACE W/IMG GUIDE: IMG5380

## 2017-03-24 LAB — BODY FLUID CELL COUNT WITH DIFFERENTIAL
Eos, Fluid: 0 %
Lymphs, Fluid: 52 %
Monocyte-Macrophage-Serous Fluid: 38 % — ABNORMAL LOW (ref 50–90)
Neutrophil Count, Fluid: 10 % (ref 0–25)
Total Nucleated Cell Count, Fluid: 83 cu mm (ref 0–1000)

## 2017-03-24 LAB — CBC
HCT: 28.4 % — ABNORMAL LOW (ref 36.0–46.0)
Hemoglobin: 9.6 g/dL — ABNORMAL LOW (ref 12.0–15.0)
MCH: 25.5 pg — ABNORMAL LOW (ref 26.0–34.0)
MCHC: 33.8 g/dL (ref 30.0–36.0)
MCV: 75.5 fL — ABNORMAL LOW (ref 78.0–100.0)
Platelets: 211 10*3/uL (ref 150–400)
RBC: 3.76 MIL/uL — ABNORMAL LOW (ref 3.87–5.11)
RDW: 14.2 % (ref 11.5–15.5)
WBC: 11.4 10*3/uL — ABNORMAL HIGH (ref 4.0–10.5)

## 2017-03-24 LAB — GRAM STAIN

## 2017-03-24 LAB — LACTATE DEHYDROGENASE, PLEURAL OR PERITONEAL FLUID: LD, Fluid: 72 U/L — ABNORMAL HIGH (ref 3–23)

## 2017-03-24 LAB — PROTEIN, PLEURAL OR PERITONEAL FLUID: Total protein, fluid: <3 g/dL

## 2017-03-24 LAB — BASIC METABOLIC PANEL
Anion gap: 11 (ref 5–15)
BUN: 30 mg/dL — ABNORMAL HIGH (ref 6–20)
CO2: 31 mmol/L (ref 22–32)
Calcium: 9 mg/dL (ref 8.9–10.3)
Chloride: 93 mmol/L — ABNORMAL LOW (ref 101–111)
Creatinine, Ser: 2.73 mg/dL — ABNORMAL HIGH (ref 0.44–1.00)
GFR calc Af Amer: 20 mL/min — ABNORMAL LOW (ref >60)
GFR calc non Af Amer: 17 mL/min — ABNORMAL LOW (ref >60)
Glucose, Bld: 138 mg/dL — ABNORMAL HIGH (ref 65–99)
Potassium: 3.2 mmol/L — ABNORMAL LOW (ref 3.5–5.1)
Sodium: 135 mmol/L (ref 135–145)

## 2017-03-24 LAB — GLUCOSE, CAPILLARY
Glucose-Capillary: 127 mg/dL — ABNORMAL HIGH (ref 65–99)
Glucose-Capillary: 194 mg/dL — ABNORMAL HIGH (ref 65–99)
Glucose-Capillary: 161 mg/dL — ABNORMAL HIGH (ref 65–99)
Glucose-Capillary: 168 mg/dL — ABNORMAL HIGH (ref 65–99)

## 2017-03-24 LAB — HEMOGLOBIN AND HEMATOCRIT, BLOOD
HCT: 28.5 % — ABNORMAL LOW (ref 36.0–46.0)
Hemoglobin: 10 g/dL — ABNORMAL LOW (ref 12.0–15.0)

## 2017-03-24 LAB — HEPATITIS B SURFACE ANTIGEN: Hepatitis B Surface Ag: NEGATIVE

## 2017-03-24 LAB — HEPATITIS B SURFACE ANTIBODY,QUALITATIVE: Hep B S Ab: NONREACTIVE

## 2017-03-24 LAB — MAGNESIUM
Magnesium: 1.9 mg/dL (ref 1.7–2.4)
Magnesium: 1.8 mg/dL (ref 1.7–2.4)

## 2017-03-24 LAB — HEPATITIS B CORE ANTIBODY, IGM: Hep B C IgM: NEGATIVE

## 2017-03-24 LAB — PROTIME-INR
INR: 1.16
Prothrombin Time: 14.7 seconds (ref 11.4–15.2)

## 2017-03-24 LAB — HEPARIN LEVEL (UNFRACTIONATED)
Heparin Unfractionated: 0.88 IU/mL — ABNORMAL HIGH (ref 0.30–0.70)
Heparin Unfractionated: 0.15 IU/mL — ABNORMAL LOW (ref 0.30–0.70)

## 2017-03-24 LAB — GLUCOSE, PLEURAL OR PERITONEAL FLUID: Glucose, Fluid: 168 mg/dL

## 2017-03-24 LAB — POTASSIUM: Potassium: 4.1 mmol/L (ref 3.5–5.1)

## 2017-03-24 MED ORDER — POTASSIUM CHLORIDE CRYS ER 20 MEQ PO TBCR: 20.0000 meq | EXTENDED_RELEASE_TABLET | Freq: Three times a day (TID) | ORAL | Status: DC

## 2017-03-24 MED ORDER — HEPARIN (PORCINE) IN NACL 100-0.45 UNIT/ML-% IJ SOLN
550.0000 [IU]/h | INTRAMUSCULAR | Status: DC
  Administered 2017-03-24 (×2): 550 [IU]/h via INTRAVENOUS
  Filled 2017-03-24: qty 250

## 2017-03-24 MED ORDER — POTASSIUM CHLORIDE CRYS ER 20 MEQ PO TBCR
40.0000 meq | EXTENDED_RELEASE_TABLET | Freq: Two times a day (BID) | ORAL | Status: DC
  Administered 2017-03-24 – 2017-03-25 (×3): 40 meq via ORAL
  Filled 2017-03-24 (×3): qty 2

## 2017-03-24 MED ORDER — POTASSIUM CHLORIDE CRYS ER 20 MEQ PO TBCR
40.0000 meq | EXTENDED_RELEASE_TABLET | Freq: Once | ORAL | Status: AC
  Administered 2017-03-24: 40 meq via ORAL
  Filled 2017-03-24: qty 2

## 2017-03-24 MED ORDER — LIDOCAINE HCL 1 % IJ SOLN
INTRAMUSCULAR | Status: AC
  Filled 2017-03-24: qty 20

## 2017-03-24 MED ORDER — FUROSEMIDE 10 MG/ML IJ SOLN
40.0000 mg | Freq: Two times a day (BID) | INTRAMUSCULAR | Status: DC
  Administered 2017-03-24 – 2017-03-25 (×2): 40 mg via INTRAVENOUS
  Filled 2017-03-24 (×2): qty 4

## 2017-03-24 MED ORDER — IOPAMIDOL (ISOVUE-300) INJECTION 61%: 15.0000 mL | INTRAVENOUS | Status: AC

## 2017-03-24 MED ORDER — HYDRALAZINE HCL 50 MG PO TABS: 75.0000 mg | ORAL_TABLET | Freq: Three times a day (TID) | ORAL | Status: DC

## 2017-03-24 MED ORDER — HYDRALAZINE HCL 50 MG PO TABS
50.0000 mg | ORAL_TABLET | Freq: Three times a day (TID) | ORAL | Status: DC
  Administered 2017-03-24 – 2017-03-25 (×3): 50 mg via ORAL
  Filled 2017-03-24 (×3): qty 1

## 2017-03-24 NOTE — Progress Notes (Signed)
Advanced Heart Failure Rounding Note  Primary Cardiologist: Dr. Bettina Gavia Primary HF: New (Dr. Aundra Dubin)  Subjective:    Fatigued this am. Denies SOB. Is having mild lightheadedness with marked diuresis noted yesterday. UO in foley still clear to light yellow.   Creatinine 2.6 -> 2.7. K 3.2. Had total of 7.3 L UO yesterday.  Negative 6.6L. Weight down 12 lbs.   Objective:   Weight Range: 141 lb 12.1 oz (64.3 kg) Body mass index is 20.93 kg/m.   Vital Signs:   Temp:  [97.6 F (36.4 C)-99.2 F (37.3 C)] 97.9 F (36.6 C) (09/25 0820) Pulse Rate:  [70-114] 73 (09/25 0820) Resp:  [14-22] 14 (09/25 0820) BP: (152-184)/(71-96) 183/92 (09/25 0820) SpO2:  [97 %-100 %] 100 % (09/25 0820) Weight:  [141 lb 12.1 oz (64.3 kg)] 141 lb 12.1 oz (64.3 kg) (09/25 0429) Last BM Date: 03/22/17  Weight change: Filed Weights   03/22/17 0417 03/23/17 0308 03/24/17 0429  Weight: 159 lb 9.8 oz (72.4 kg) 153 lb 3.5 oz (69.5 kg) 141 lb 12.1 oz (64.3 kg)    Intake/Output:   Intake/Output Summary (Last 24 hours) at 03/24/17 0842 Last data filed at 03/24/17 0820  Gross per 24 hour  Intake           699.43 ml  Output             8125 ml  Net         -7425.57 ml      Physical Exam    General:  Chronically ill appearing. No resp difficulty HEENT: Normal Neck: Supple. JVP does not appear elevated.  Carotids 2+ bilat; no bruits. No lymphadenopathy or thyromegaly appreciated. Cor: PMI nondisplaced. Regular with ectopy. Severe MR.  Lungs: Clear anteriorly, diminished basilar sounds. Dull R base. Abdomen: Soft, nontender, nondistended. No hepatosplenomegaly. No bruits or masses. Good bowel sounds. Extremities: No cyanosis, clubbing, or rash. Trace ankle edema. Warm. SCDs in place.  Neuro: Alert & orientedx3, cranial nerves grossly intact. moves all 4 extremities w/o difficulty. Affect pleasant   Telemetry   NSR with occasional PVCs, personally reviewed.   EKG    Aflutter with RVR 03/21/17.  Personally reviewed  Labs    CBC  Recent Labs  03/22/17 0914 03/24/17 0434  WBC 10.6* 11.4*  NEUTROABS 8.8*  --   HGB 9.5* 9.6*  HCT 28.7* 28.4*  MCV 75.7* 75.5*  PLT 222 400   Basic Metabolic Panel  Recent Labs  03/22/17 0914 03/23/17 0546 03/24/17 0434  NA  --  136 135  K  --  3.2* 3.2*  CL  --  97* 93*  CO2  --  31 31  GLUCOSE  --  146* 138*  BUN  --  34* 30*  CREATININE  --  2.62* 2.73*  CALCIUM  --  9.2 9.0  MG 1.9 1.7  --    Liver Function Tests No results for input(s): AST, ALT, ALKPHOS, BILITOT, PROT, ALBUMIN in the last 72 hours. No results for input(s): LIPASE, AMYLASE in the last 72 hours. Cardiac Enzymes No results for input(s): CKTOTAL, CKMB, CKMBINDEX, TROPONINI in the last 72 hours.  BNP: BNP (last 3 results)  Recent Labs  03/12/17 1400  BNP 2,230.5*    ProBNP (last 3 results) No results for input(s): PROBNP in the last 8760 hours.   D-Dimer No results for input(s): DDIMER in the last 72 hours. Hemoglobin A1C No results for input(s): HGBA1C in the last 72 hours. Fasting Lipid Panel  No results for input(s): CHOL, HDL, LDLCALC, TRIG, CHOLHDL, LDLDIRECT in the last 72 hours. Thyroid Function Tests No results for input(s): TSH, T4TOTAL, T3FREE, THYROIDAB in the last 72 hours.  Invalid input(s): FREET3  Other results:   Imaging    Dg Orthopantogram  Result Date: 03/23/2017 CLINICAL DATA:  Poor dentition.  Cardiac issues. EXAM: ORTHOPANTOGRAM/PANORAMIC COMPARISON:  None. FINDINGS: Mandible is intact. Numerous absent teeth. Multiple dental caries involving maxillary teeth, difficult to number due to overlapping incisors. Approximate tooth 3 periapical lucency/abscess. IMPRESSION: Poor dentition with multiple maxillary dental caries and, approximate tooth 3 periapical abscess. Electronically Signed   By: Elon Alas M.D.   On: 03/23/2017 19:56      Medications:     Scheduled Medications: . amLODipine  10 mg Oral Daily  .  aspirin  81 mg Per Tube Daily  . docusate sodium  100 mg Oral BID  . furosemide  80 mg Intravenous Q8H  . hydrALAZINE  50 mg Oral Q8H  . insulin aspart  0-15 Units Subcutaneous TID WC  . levothyroxine  112 mcg Per Tube QAC breakfast  . mouth rinse  15 mL Mouth Rinse BID  . pantoprazole  40 mg Oral Daily  . potassium chloride  20 mEq Oral BID  . sodium chloride flush  10-40 mL Intracatheter Q12H  . vitamin C  250 mg Oral BID     Infusions: . amiodarone 30 mg/hr (03/23/17 2351)  . ferric gluconate (FERRLECIT/NULECIT) IV Stopped (03/23/17 1459)  . heparin 750 Units/hr (03/24/17 0051)     PRN Medications:  acetaminophen **OR** acetaminophen, bisacodyl, hydrALAZINE, levalbuterol, LORazepam, morphine, ondansetron (ZOFRAN) IV, sennosides, sodium chloride flush    Patient Profile    Jocelyn Hill. Hukill is a 66 y.o. female with h/o MR, Diastolic CHF, Afib, CAD s/p MI, long standing HTN, DM2, CKD IV-V, h/o Stroke. Anxiety, depression and GERD.   Pt admitted 03/12/17 with acute hypoxemic respiratory failure and CHF. HF team consulted to optimized prior to MVR consideration.   Assessment/Plan   1. 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  - Dr. Roxy Manns recommends treatment of CHF, Afib, ARF with trialysis cath placement, cardiac cath (L/R), and dental service consultation prior to surgery consideration.  2. Acute on chronic diastolic CHF - CVP ~5-7 on my personally check. Discussed with Renal. Lasix decreased to 40 mg IV BID for today.  3. PAF/Flutter - Converted to NSR on amio 03/23/17. - Remains on amio 30 mg/hr. Has had about 2.5 g total so far. Continue gtt for today.  - This patients CHA2DS2-VASc Score is at least 8 CHF, HTN, Age, DM, Stroke, and female). IV heparin started yesterday.  4. CAD s/p MI x 2. - Pt states last MI was 2012-2013. She has not had ischemic work up since then to her recollection.  - Trop peak to 0.11. Likely from  demand ischemia from CHF, but needs cath once stabilized prior to MVR consideration. No change.  5. Bradycardia - Rates stable in 70s. Continue to follow.  - s/p temp pacemaker 6. ARF - Renal following. Attempted HD catheter placement yesterday but unable to obtain access.  - Creatinine 2.6 -> 2.7. ( improved from 5 earlier in admission.  7. HTN - Continue hydralazine 50 mg po TID. Give 10 mg IV prn q 4 hrs for BP > 170.  BP has overall improved to 140-150 on this increased dose.  - Contine norvasc 10 mg daily. 8. Metabolic encephalopathy -  Waxes and wanes. Thought to be related to ARF.  9. R pleural effusion - Ordered for thoracentesis. 10. Hypokalemia - K 3.2 again this am. Continue to supp.  - Mg 1.7 03/24/17. Got 2 g. Recheck.   Length of Stay: [address]  Annamaria Helling  03/24/2017, 8:42 AM  Advanced Heart Failure Team Pager [phone] (M-F; 7a - 4p)  Please contact [facility] for night-coverage after hours (4p -7a ) and weekends on [email or web address]  Patient seen with PA, agree with the above note.  1. 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  She will eventually need mitral valve intervention, likely replacement.  Will need to stabilize her as much as possible medically prior to this.  She will need left/right heart cath pre-surgery when creatinine is as stabilized as we can get it.  She is not very mobile at all as of yet, need to see some improvement in this prior to surgery.  2. Acute on chronic diastolic CHF: Normal EF on echo with severe MR. She is now diuresing well and CVP down to about 4. On exam, she does not look volume overloaded.  Creatinine stable at 2.7 today.  Lasix decreased to 40 mg IV bid today, likely can stop IV tomorrow.  3. Atrial fibrillation: Paroxysmal, in setting of rheumatic mitral valve disease.  Now in NSR.  CHADSVASC = 8 including history of CVA.  She should be on IV heparin gtt for now given need for procedures in the future.  Rate is  controlled on amiodarone gtt. Consider Maze with MV surgery.  4. Right pleural effusion: Thoracentesis today.  5. AKI on CKD stage 3: ?Baseline creatinine around 2.1.  Creatinine stable at 2.7 today, follow with diuresis.  When creatinine appears to have plateaued, she will need right/left heart cath with the understanding that renal function could worsen again with the contrast. 6. HTN: Continue amlodipine and hydralazine at current doses, BP better today.   7. CAD: MI in past.  Will need coronary angiography prior to MV surgery.  8. Bradycardia: Resolved.  9. Deconditioning: PT consult, up to chair.   [name] 03/24/2017 1:06 PM

## 2017-03-24 NOTE — Progress Notes (Signed)
ANTICOAGULATION CONSULT NOTE - Follow Up Consult  Pharmacy Consult for heparin Indication: atrial fibrillation  Labs:  Recent Labs  03/22/17 0914 03/23/17 0546 03/23/17 2205 03/24/17 0434 03/24/17 0753  HGB 9.5*  --   --  9.6*  --   HCT 28.7*  --   --  28.4*  --   PLT 222  --   --  211  --   LABPROT  --   --   --   --  14.7  INR  --   --   --   --  1.16  HEPARINUNFRC  --   --  1.20*  --  0.88*  CREATININE  --  2.62*  --  2.73*  --     Assessment: 66yo female above goal on heparin with initial dosing for Afib.  Heparin level still above goal after rate adjustment this morning. Will adjust rate again. No bleeding issues noted, cbc has been stable.   Goal of Therapy:  Heparin level 0.3-0.7 units/ml   Plan:  Will hold heparin gtt x31min then resume at lower rate of 550 units/hr and check level in 8hr.  Erin Hearing PharmD., BCPS Clinical Pharmacist Pager [phone] 03/24/2017 8:53 AM

## 2017-03-24 NOTE — Progress Notes (Signed)
PROGRESS NOTE    Jocelyn Hill. Jocelyn Hill  POE:[id number] DOB: 1950-12-04 DOA: 03/12/2017 PCP: [name], MD   Brief Narrative:  66 y.o. BF PMHx Anxiety, Depression, CVA, poorly controlled HTN, CKD stage III, DM type II with Peripheral Neuropathy, Hypothyroidism    Who states that over the past month, she is noted she has become more winded. Then starting on Saturday, 9/8, patient cut the point where she felt like she is having a lot of trouble breathing. The symptoms continued to progress to the point where she could not take it anymore and she came in to the emergency room at [facility] for further evaluation. Patient found to have a BNP of 1700 with a new diagnosis of CHF. Chest x-ray noted moderate bilateral pleural effusions. Patient was given Lasix and put on nitroglycerin patch. Blood pressure systolic in the [id number]. Patient initially BiPAP to keep oxygen saturations greater than 90%. In addition, creatinine noted to be 3.2 or baseline was 2.1. Because of worsening creatinine and decompensated CHF, it was felt best to transfer this patient to [facility]. Hospitalists accepted transfer.    Subjective: 9/25 A/O 4, positive SOB,positive abdominal pain (generalized but greatest in RUQ),positive nausea, negative vomiting, started this A.m.        Assessment & Plan:   Principal Problem:   Acute on chronic respiratory failure with hypoxia (HCC) Active Problems:   Hypertension   Hypothyroidism   Diet-controlled diabetes mellitus (HCC)   Acute diastolic (congestive) heart failure (HCC)   AKI (acute kidney injury) (HCC)   CKD (chronic kidney disease), stage III   CHF (congestive heart failure) (HCC)   Dysphagia   Malignant hypertension   Microcytic anemia   Severe mitral regurgitation   CKD (chronic kidney disease), stage IV (HCC)   Substance abuse   Acute diastolic CHF (congestive heart failure) (HCC)   Pulmonary hypertension (HCC)   Asystole (HCC)   Non-rheumatic  mitral regurgitation   Acute renal failure superimposed on stage 3 chronic kidney disease (HCC)   Anemia due to chronic kidney disease   Controlled diabetes mellitus type 2 with complications (HCC)   Acute encephalopathy   Acute Respiratory failure with Hypoxia/Pulmonary Vascular congestion -Currently on 10 L O2 via HFNC -Patient with good air movement/negative wheezing do not believe COPD exacerbation -Xopenex TID.    Substance abuse -UDS positive for benzodiazepine, opiates, marijuana. Unable to differentiate if positive prior to hospitalization for Benzodiazepine/Opiates.   Acute Diastolic CHF -Strict in and out since admission -19.8 L -Monitor CVP. Patient fluid overloaded. 9/25 CVP = 3 mmHg -Daily weight Filed Weights   03/22/17 0417 03/23/17 0308 03/24/17 0429  Weight: 159 lb 9.8 oz (72.4 kg) 153 lb 3.5 oz (69.5 kg) 141 lb 12.1 oz (64.3 kg)  -Discontinued all nodal blocking agent -Amlodipine 10 mg  daily -Furosemide 40 mg BID -Hydralazine 50 mg  TID -Hydralazine  PRN -Monitor closely for bradycardia. Evening of 9/20 patient with episodes of bradycardia/asystole pauses see addendum #2  -NCM Roque Cash working on seeing a). If dental services available next week. b). No dental services available next week how to transport patient into town to have teeth evaluated/removed.     Recent Labs Lab 03/19/17 1334 03/19/17 1930 03/20/17 0100  TROPONINI 0.10* 0.11* 0.11*  -elevated troponin most likely secondary to demand ischemia, continue monitor. -Cardiac catheterization pending: When cardiology feels stable from fluid perspective . -Nephrology: HD not needed at this time   Pulmonary HTN  -See CHF   Bradycardia/Asystole -  See CHF  Severe Mitral Valve regurgitation -Per cardiology, requires Dental consult prior to MV replacement. Unfortunately adult dental services unavailable this week. Have attempted to page Jorja Loa DDS unsuccessfully. -Will require MV replacement  once cardiac catheterization complete, and dental issues resolved  Paroxysmal atrial fibrillation/flutter -See CHF -DC heparin drip secondary to retroperitoneal bleed    Acute on CKD stage III   Recent Labs Lab 03/19/17 0316 03/19/17 1930 03/20/17 0418 03/21/17 0444 03/23/17 0546 03/24/17 0434  CREATININE 3.51* 3.57* 3.56* 3.00* 2.62* 2.73*  -creatinine improving   Chronic anemia(Mixed anemia)/Acute Blood Loss anemia -No overt sign of bleeding -Anemia panel: Although MCV 76, panel not consistent pure microcytic anemia.  Recent Labs Lab 03/18/17 0346 03/19/17 0316 03/20/17 0406 03/21/17 0444 03/22/17 0914 03/24/17 0434  HGB 9.7* 9.4* 9.3* 9.5* 9.5* 9.6*  -Monitor closely retroperitoneal bleed -Continue Fe 325 mg BID + vitamin C 250 mg BID -H/H q12   Diabetes type 2 controlled with, complication -8/65 Hemoglobin A1c = 6.2 -Moderate SSI   Hypothyroidism -Synthroid 112 g daily    Acute encephalopathy  -Multifactorial hyperperfusion, bradycardia, depression, chest pain -Resolved Continue to hold sedating medication: Hold Ambien, Ultram, Zanaflex, Lyrica  Hypokalemia -Potassium goal> 4 -K-Dur 40 mEq BID -K-Dur 40 mEq x 1  -Recheck K/Mg @ 1600: At/near goal  Hypomagnesemia -Magnesium goal> 2   Elevated liver enzyme????   Leukocytosis -Increasing  infection vs stress Induced? -continued mild elevated WBC  Right retroperitoneal hematoma/Abdominal pain -acute RUQ abdominal pain with Alvarado score>4 -Abdominal CT; showed right retroperitoneal bleed this results below     DVT prophylaxis: SCD Code Status: Full Family Communication: None Disposition Plan: TBD   Consultants:  Cardiology Nephrology PC CM      Procedures/Significant Events:  9/13 Admit 9/13 Echocardiogram: LVEF= 60-65%. -Grade 2 diastolic dysfunction.-Severe MR.-Mildly dilated LA and RA.-, mild to mod TR,. PAP 60 mm Hg. 9/18 TEE: EF 55-60%.-Severe restriction  anterior/posterior leaflet MV w/ severe regurgitation  9/17 CXR: Bilateral pleural effusion.-Mild pulmonary interstitial edema.-Cardiomegaly  9/17 Brady/hypotensive/ intubated > transfer to ICU 9/20 patient to have pauses longest 6.7 seconds/bradycardia. All nodal blocking agents and NTG drips discontinued. 3 Amps Atropine administered.  9/25 Abdominal/pelvis CT:-RIGHT retroperitoneal hematoma extending into the right side of pelvis and right groin region. -Hematocrit level isidentified within the asymmetrically enlarged right psoas muscle hematoma. - Appendix is normal. -Gallbladder hydrops. Etiology and clinical significance indeterminate.  -Bilateral pleural effusions with diminished aeration to the lung bases. 9/25 RIGHT Thoracentesis; 0.35 L of pleural fluid   I have personally reviewed and interpreted all radiology studies and my findings are as above.  VENTILATOR SETTINGS: None   Cultures None    Antimicrobials: Anti-infectives    Start     Stop   03/16/17 1000  azithromycin (ZITHROMAX) tablet 250 mg  Status:  Discontinued     03/16/17 1316   03/14/17 0900  azithromycin (ZITHROMAX) 500 mg in dextrose 5 % 250 mL IVPB  Status:  Discontinued     03/16/17 0804        Devices     LINES / TUBES:      Continuous Infusions: . amiodarone 30 mg/hr (03/23/17 2351)  . ferric gluconate (FERRLECIT/NULECIT) IV 250 mg (03/24/17 0946)  . heparin 550 Units/hr (03/24/17 0936)     Objective: Vitals:   03/24/17 0320 03/24/17 0429 03/24/17 0621 03/24/17 0820  BP: (!) 172/85  (!) 152/89 (!) 183/92  Pulse: (!) 110   73  Resp: 16   14  Temp: 98.9 F (37.2 C)   97.9 F (36.6 C)  TempSrc: Axillary   Oral  SpO2: 100%   100%  Weight:  141 lb 12.1 oz (64.3 kg)    Height:        Intake/Output Summary (Last 24 hours) at 03/24/17 1144 Last data filed at 03/24/17 1058  Gross per 24 hour  Intake           719.43 ml  Output             9075 ml  Net         -8355.57 ml    Filed Weights   03/22/17 0417 03/23/17 0308 03/24/17 0429  Weight: 159 lb 9.8 oz (72.4 kg) 153 lb 3.5 oz (69.5 kg) 141 lb 12.1 oz (64.3 kg)    Physical Exam:  General: A/O 4, positive acute respiratory distress (improved) ENT: Extremely poor dentation (chronic broken teeth, peridontal disease) Neck:  Negative scars, masses, torticollis, lymphadenopathy, JVD Lungs: Clear to auscultation bilaterally upper lobes, decreased absent breath sounds by basilar, negative expiratory wheeze or crackles  Cardiovascular: Regular rate and rhythm without murmur gallop or rub normal S1 and S2 Abdomen: Positive generalized abdominal pain>>> RLQ-periumbilical, nondistended, positive soft, bowel sounds, no rebound, no ascites, no appreciable mass Extremities: No significant cyanosis, clubbing, or edema bilateral lower extremities Skin: Negative rashes, lesions, ulcers Psychiatric:  Negative depression, negative anxiety, negative fatigue, negative mania  Central nervous system:  Cranial nerves II through XII intact, tongue/uvula midline, all extremities muscle strength 5/5, sensation intact throughout, , negative dysarthria, negative expressive aphasia, negative receptive aphasia.    Data Reviewed: Care during the described time interval was provided by me .  I have reviewed this patient's available data, including medical history, events of note, physical examination, and all test results as part of my evaluation.   CBC:  Recent Labs Lab 03/19/17 0316 03/20/17 0406 03/21/17 0444 03/22/17 0914 03/24/17 0434  WBC 9.2 11.0* 12.1* 10.6* 11.4*  NEUTROABS  --   --   --  8.8*  --   HGB 9.4* 9.3* 9.5* 9.5* 9.6*  HCT 27.5* 27.4* 28.3* 28.7* 28.4*  MCV 75.5* 76.1* 75.3* 75.7* 75.5*  PLT 229 234 243 222 193   Basic Metabolic Panel:  Recent Labs Lab 03/18/17 0346 03/19/17 0316 03/19/17 1930 03/20/17 0418 03/21/17 0444 03/21/17 1345 03/22/17 0914 03/23/17 0546 03/24/17 0434  NA 136 139 136 140  136  --   --  136 135  K 4.1 3.9 3.6 3.8 3.4* 3.7  --  3.2* 3.2*  CL 102 108 107 107 103  --   --  97* 93*  CO2 23 21* 21* 20* 22  --   --  31 31  GLUCOSE 96 98 130* 133* 165*  --   --  146* 138*  BUN 66* 56* 54* 55* 51*  --   --  34* 30*  CREATININE 4.47* 3.51* 3.57* 3.56* 3.00*  --   --  2.62* 2.73*  CALCIUM 8.6* 8.4* 8.8* 9.6 9.1  --   --  9.2 9.0  MG 2.3 2.1 2.1  --   --  2.0 1.9 1.7 1.9  PHOS 5.7* 5.6*  --  5.1* 3.4  --   --   --   --    GFR: Estimated Creatinine Clearance: 20.9 mL/min (A) (by C-G formula based on SCr of 2.73 mg/dL (H)). Liver Function Tests:  Recent Labs Lab 03/18/17 0346 03/19/17 0316 03/20/17 0418 03/21/17 0444  AST  --   --   --  162*  ALT  --   --   --  677*  ALKPHOS  --   --   --  85  BILITOT  --   --   --  1.3*  PROT  --   --   --  6.0*  ALBUMIN 3.1* 3.0* 3.2* 3.2*   No results for input(s): LIPASE, AMYLASE in the last 168 hours. No results for input(s): AMMONIA in the last 168 hours. Coagulation Profile:  Recent Labs Lab 03/24/17 0753  INR 1.16   Cardiac Enzymes:  Recent Labs Lab 03/19/17 1334 03/19/17 1930 03/20/17 0100  TROPONINI 0.10* 0.11* 0.11*   BNP (last 3 results) No results for input(s): PROBNP in the last 8760 hours. HbA1C: No results for input(s): HGBA1C in the last 72 hours. CBG:  Recent Labs Lab 03/23/17 0857 03/23/17 1142 03/23/17 1709 03/23/17 2214 03/24/17 0816  GLUCAP 173* 141* 207* 109* 127*   Lipid Profile: No results for input(s): CHOL, HDL, LDLCALC, TRIG, CHOLHDL, LDLDIRECT in the last 72 hours. Thyroid Function Tests: No results for input(s): TSH, T4TOTAL, FREET4, T3FREE, THYROIDAB in the last 72 hours. Anemia Panel: No results for input(s): VITAMINB12, FOLATE, FERRITIN, TIBC, IRON, RETICCTPCT in the last 72 hours. Urine analysis:    Component Value Date/Time   COLORURINE YELLOW 03/12/2017 [place] 03/12/2017 1349   LABSPEC 1.008 03/12/2017 1349   PHURINE 6.0 03/12/2017 1349     GLUCOSEU NEGATIVE 03/12/2017 1349   HGBUR NEGATIVE 03/12/2017 1349   BILIRUBINUR NEGATIVE 03/12/2017 1349   KETONESUR NEGATIVE 03/12/2017 1349   PROTEINUR 100 (A) 03/12/2017 1349   NITRITE NEGATIVE 03/12/2017 1349   LEUKOCYTESUR TRACE (A) 03/12/2017 1349   Sepsis Labs: @LABRCNTIP (procalcitonin:4,lacticidven:4)  ) Recent Results (from the past 240 hour(s))  MRSA PCR Screening     Status: None   Collection Time: 03/15/17  8:32 PM  Result Value Ref Range Status   MRSA by PCR NEGATIVE NEGATIVE Final    Comment:        The GeneXpert MRSA Assay (FDA approved for NASAL specimens only), is one component of a comprehensive MRSA colonization surveillance program. It is not intended to diagnose MRSA infection nor to guide or monitor treatment for MRSA infections.          Radiology Studies: Dg Orthopantogram  Result Date: 03/23/2017 CLINICAL DATA:  Poor dentition.  Cardiac issues. EXAM: ORTHOPANTOGRAM/PANORAMIC COMPARISON:  None. FINDINGS: Mandible is intact. Numerous absent teeth. Multiple dental caries involving maxillary teeth, difficult to number due to overlapping incisors. Approximate tooth 3 periapical lucency/abscess. IMPRESSION: Poor dentition with multiple maxillary dental caries and, approximate tooth 3 periapical abscess. Electronically Signed   By: Elon Alas M.D.   On: 03/23/2017 19:56   Dg Chest Port 1 View  Result Date: 03/23/2017 CLINICAL DATA:  Follow-up bilateral alveolar opacities. EXAM: PORTABLE CHEST 1 VIEW COMPARISON:  Portable chest x-ray of March 22, 2017 FINDINGS: The lungs are adequately inflated. Bibasilar parenchymal consolidation persists. The interstitial markings elsewhere are mildly prominent. The cardiac silhouette is enlarged. The pulmonary vascularity is engorged. There is calcification in the wall of the aortic arch. The right internal jugular Cordis sheath tip projects over the proximal SVC. There is a kink in this catheter as it  enters the scan. IMPRESSION: Bibasilar pneumonia. Small bilateral pleural effusions. Parenchymal consolidation at the right base has increased since yesterday's study. Mild CHF. Thoracic aortic atherosclerosis. Electronically Signed   By: David  Martinique M.D.   On: 03/23/2017 07:37  Scheduled Meds: . amLODipine  10 mg Oral Daily  . aspirin  81 mg Per Tube Daily  . docusate sodium  100 mg Oral BID  . furosemide  40 mg Intravenous Q12H  . hydrALAZINE  50 mg Oral Q8H  . insulin aspart  0-15 Units Subcutaneous TID WC  . levothyroxine  112 mcg Per Tube QAC breakfast  . mouth rinse  15 mL Mouth Rinse BID  . pantoprazole  40 mg Oral Daily  . potassium chloride  40 mEq Oral BID  . sodium chloride flush  10-40 mL Intracatheter Q12H  . vitamin C  250 mg Oral BID   Continuous Infusions: . amiodarone 30 mg/hr (03/23/17 2351)  . ferric gluconate (FERRLECIT/NULECIT) IV 250 mg (03/24/17 0946)  . heparin 550 Units/hr (03/24/17 0936)     LOS: 12 days    Time spent: 40 minutes    Dee Maday, Geraldo Docker, MD [facility] Pager [phone]   If 7PM-7AM, please contact night-coverage [email or web address] Password [facility] 03/24/2017, 11:44 AM

## 2017-03-24 NOTE — Progress Notes (Signed)
[facility] Progress Note  Subjective: Had 7300 of urine yest !!!- neg 6600- on lasix  80 q 8 hours- crt stable    Vitals:   03/24/17 0320 03/24/17 0429 03/24/17 0621 03/24/17 0820  BP: (!) 172/85  (!) 152/89 (!) 183/92  Pulse: (!) 110   73  Resp: 16   14  Temp: 98.9 F (37.2 C)   97.9 F (36.6 C)  TempSrc: Axillary   Oral  SpO2: 100%   100%  Weight:  64.3 kg (141 lb 12.1 oz)    Height:        Inpatient medications: . amLODipine  10 mg Oral Daily  . aspirin  81 mg Per Tube Daily  . docusate sodium  100 mg Oral BID  . furosemide  80 mg Intravenous Q8H  . hydrALAZINE  50 mg Oral Q8H  . insulin aspart  0-15 Units Subcutaneous TID WC  . levothyroxine  112 mcg Per Tube QAC breakfast  . mouth rinse  15 mL Mouth Rinse BID  . pantoprazole  40 mg Oral Daily  . potassium chloride  20 mEq Oral BID  . sodium chloride flush  10-40 mL Intracatheter Q12H  . vitamin C  250 mg Oral BID   . amiodarone 30 mg/hr (03/23/17 2351)  . ferric gluconate (FERRLECIT/NULECIT) IV Stopped (03/23/17 1459)  . heparin 750 Units/hr (03/24/17 0051)   acetaminophen **OR** acetaminophen, bisacodyl, hydrALAZINE, levalbuterol, LORazepam, morphine, ondansetron (ZOFRAN) IV, sennosides, sodium chloride flush  Exam: On nasal O2, not in distress Chest rales R base, L clear RRR no RG, 3/6 sem Abd soft ntnd no ascites Ext no LE edema NF, follows commands  Home meds :   - norvasc/ hydralazine 100 tid/ labetalol 200 bid/ minoxidil 2.5 qd - albuterol/ asa/ lipitor/ T4 / singulair/ PPI / KCl  - wellbutrin / cymbalta/ neurontin/ Lyrica / ambien prn / oxy prn   CXR 9/23 diffuse edema  Impression: 1.  Acute on CKD 4 - improving creat but worse vol status w/ pulm edema.  Needs heart cath and valve replacement; with CKD stage IV bordering stage V needed higher doses of lasix  to stabilize her first.  Seems to be responding- lost 18 pounds of fluid in 2 days.   they are agreeable to dialysis if needed   but does not appear is needed at this time. 2.  Pulm edema - as above- down to 6 liters [place] from mask- could probably be weaned more 3.  Severe MR - per cards 4.  HTN - bp's high, vol excess- also on norvasc 10 and hydralazine 15 q 6 in addition to lasix 80 q 8- will decrease to 40  q 12- may be able to stop completely eventually   5.  Hx CVA/ depression/ chronic pain 6. Anemia- giving iron 7. Hypokalemia- repleting     Jamaal Bernasconi A  03/24/2017, 8:54 AM    Recent Labs Lab 03/19/17 0316  03/20/17 0418 03/21/17 0444 03/21/17 1345 03/23/17 0546 03/24/17 0434  NA 139  < > 140 136  --  136 135  K 3.9  < > 3.8 3.4* 3.7 3.2* 3.2*  CL 108  < > 107 103  --  97* 93*  CO2 21*  < > 20* 22  --  31 31  GLUCOSE 98  < > 133* 165*  --  146* 138*  BUN 56*  < > 55* 51*  --  34* 30*  CREATININE 3.51*  < > 3.56* 3.00*  --  2.62* 2.73*  CALCIUM 8.4*  < > 9.6 9.1  --  9.2 9.0  PHOS 5.6*  --  5.1* 3.4  --   --   --   < > = values in this interval not displayed.  Recent Labs Lab 03/19/17 0316 03/20/17 0418 03/21/17 0444  AST  --   --  162*  ALT  --   --  677*  ALKPHOS  --   --  85  BILITOT  --   --  1.3*  PROT  --   --  6.0*  ALBUMIN 3.0* 3.2* 3.2*    Recent Labs Lab 03/21/17 0444 03/22/17 0914 03/24/17 0434  WBC 12.1* 10.6* 11.4*  NEUTROABS  --  8.8*  --   HGB 9.5* 9.5* 9.6*  HCT 28.3* 28.7* 28.4*  MCV 75.3* 75.7* 75.5*  PLT 243 222 211   Iron/TIBC/Ferritin/ %Sat    Component Value Date/Time   IRON 62 03/20/2017 0406   TIBC 256 03/20/2017 0406   FERRITIN 148 03/20/2017 0406   IRONPCTSAT 24 03/20/2017 0406

## 2017-03-24 NOTE — Evaluation (Signed)
Physical Therapy Evaluation Patient Details Name: Jocelyn Hill. Agent MRN: [id number] DOB: Nov 08, 1950 Today's Date: 03/24/2017   History of Present Illness  Pt adm with Acute Respiratory failure with Hypoxia due to pulm edema and B Pleural effusions and Acute Diastolic CHF. PMH - ckd, dm, depression, cva  Clinical Impression  Pt admitted with above diagnosis and presents to PT with functional limitations due to deficits listed below (See PT problem list). Pt needs skilled PT to maximize independence and safety to allow discharge to ST-SNF. Pt very weak and orthostatic. Lives alone and expect will need post acute rehab prior to return.     Follow Up Recommendations SNF    Equipment Recommendations  Rolling walker with 5" wheels    Recommendations for Other Services       Precautions / Restrictions Precautions Precautions: Fall Restrictions Weight Bearing Restrictions: No      Mobility  Bed Mobility Overal bed mobility: Needs Assistance Bed Mobility: Rolling;Sidelying to Sit;Sit to Sidelying Rolling: Min assist Sidelying to sit: Mod assist     Sit to sidelying: +2 for physical assistance;Mod assist General bed mobility comments: Assist elevating trunk into sitting and scooting to EOB. Assist to return to supine due to lightheadedness  Transfers                 General transfer comment: Attempted to stand with +2 assist but pt unable to rise and with c/o being lightheaded. BP checked and pt orthostatic.  Ambulation/Gait                Stairs            Wheelchair Mobility    Modified Rankin (Stroke Patients Only)       Balance                                             Pertinent Vitals/Pain Pain Assessment: Faces Faces Pain Scale: Hurts even more Pain Location: rt flank Pain Descriptors / Indicators: Grimacing;Guarding Pain Intervention(s): Limited activity within patient's tolerance;Monitored during session;Repositioned     Home Living Family/patient expects to be discharged to:: Private residence Living Arrangements: Alone             Home Equipment: None      Prior Function Level of Independence: Independent         Comments: drives     Hand Dominance        Extremity/Trunk Assessment   Upper Extremity Assessment Upper Extremity Assessment: Defer to OT evaluation    Lower Extremity Assessment Lower Extremity Assessment: Generalized weakness       Communication   Communication: No difficulties  Cognition Arousal/Alertness: Awake/alert Behavior During Therapy: WFL for tasks assessed/performed Overall Cognitive Status: Within Functional Limits for tasks assessed                                        General Comments      Exercises     Assessment/Plan    PT Assessment Patient needs continued PT services  PT Problem List Decreased strength;Decreased activity tolerance;Decreased balance;Decreased mobility;Cardiopulmonary status limiting activity       PT Treatment Interventions DME instruction;Gait training;Functional mobility training;Therapeutic activities;Therapeutic exercise;Balance training;Patient/family education    PT Goals (Current goals can be found in the Care  Plan section)  Acute Rehab PT Goals Patient Stated Goal: not stated PT Goal Formulation: With patient Time For Goal Achievement: 04/07/17 Potential to Achieve Goals: Good    Frequency Min 3X/week   Barriers to discharge Decreased caregiver support lives alone    Co-evaluation               AM-PAC PT "6 Clicks" Daily Activity  Outcome Measure Difficulty turning over in bed (including adjusting bedclothes, sheets and blankets)?: Unable Difficulty moving from lying on back to sitting on the side of the bed? : Unable Difficulty sitting down on and standing up from a chair with arms (e.g., wheelchair, bedside commode, etc,.)?: Unable Help needed moving to and from a bed to  chair (including a wheelchair)?: Total Help needed walking in hospital room?: Total Help needed climbing 3-5 steps with a railing? : Total 6 Click Score: 6    End of Session Equipment Utilized During Treatment: Gait belt Activity Tolerance: Treatment limited secondary to medical complications (Comment) (Pt orthostatic) Patient left: in bed;with call bell/phone within reach;with nursing/sitter in room Nurse Communication: Mobility status;Other (comment) (daughter wanted to be called by nurse with an update) PT Visit Diagnosis: Other abnormalities of gait and mobility (R26.89);Muscle weakness (generalized) (M62.81);Unsteadiness on feet (R26.81)    Time: 9191-6606 PT Time Calculation (min) (ACUTE ONLY): 21 min   Charges:   PT Evaluation $PT Eval Moderate Complexity: 1 Mod     PT G CodesMarland Kitchen        [facility] PT [place] 03/24/2017, 4:17 PM

## 2017-03-24 NOTE — Procedures (Signed)
Ultrasound-guided diagnostic and therapeutic right thoracentesis performed yielding 0.35 liters of serous colored fluid. No immediate complications. Follow-up chest x-ray pending.       Shon Indelicato E 4:10 PM 03/24/2017

## 2017-03-25 LAB — CBC
HCT: 28.2 % — ABNORMAL LOW (ref 36.0–46.0)
Hemoglobin: 9.5 g/dL — ABNORMAL LOW (ref 12.0–15.0)
MCH: 25.5 pg — ABNORMAL LOW (ref 26.0–34.0)
MCHC: 33.7 g/dL (ref 30.0–36.0)
MCV: 75.8 fL — ABNORMAL LOW (ref 78.0–100.0)
Platelets: 229 10*3/uL (ref 150–400)
RBC: 3.72 MIL/uL — ABNORMAL LOW (ref 3.87–5.11)
RDW: 14.5 % (ref 11.5–15.5)
WBC: 16.2 10*3/uL — ABNORMAL HIGH (ref 4.0–10.5)

## 2017-03-25 LAB — HEPATITIS PANEL, ACUTE
HCV Ab: <0.1 s/co ratio (ref 0.0–0.9)
Hep A IgM: NEGATIVE
Hep B C IgM: NEGATIVE
Hepatitis B Surface Ag: NEGATIVE

## 2017-03-25 LAB — GLUCOSE, CAPILLARY
Glucose-Capillary: 157 mg/dL — ABNORMAL HIGH (ref 65–99)
Glucose-Capillary: 135 mg/dL — ABNORMAL HIGH (ref 65–99)
Glucose-Capillary: 127 mg/dL — ABNORMAL HIGH (ref 65–99)
Glucose-Capillary: 191 mg/dL — ABNORMAL HIGH (ref 65–99)

## 2017-03-25 LAB — COMPREHENSIVE METABOLIC PANEL
ALT: 193 U/L — ABNORMAL HIGH (ref 14–54)
AST: 46 U/L — ABNORMAL HIGH (ref 15–41)
Albumin: 3.1 g/dL — ABNORMAL LOW (ref 3.5–5.0)
Alkaline Phosphatase: 120 U/L (ref 38–126)
Anion gap: 10 (ref 5–15)
BUN: 28 mg/dL — ABNORMAL HIGH (ref 6–20)
CO2: 31 mmol/L (ref 22–32)
Calcium: 9.5 mg/dL (ref 8.9–10.3)
Chloride: 93 mmol/L — ABNORMAL LOW (ref 101–111)
Creatinine, Ser: 2.77 mg/dL — ABNORMAL HIGH (ref 0.44–1.00)
GFR calc Af Amer: 20 mL/min — ABNORMAL LOW (ref >60)
GFR calc non Af Amer: 17 mL/min — ABNORMAL LOW (ref >60)
Glucose, Bld: 139 mg/dL — ABNORMAL HIGH (ref 65–99)
Potassium: 4.8 mmol/L (ref 3.5–5.1)
Sodium: 134 mmol/L — ABNORMAL LOW (ref 135–145)
Total Bilirubin: 1.1 mg/dL (ref 0.3–1.2)
Total Protein: 5.6 g/dL — ABNORMAL LOW (ref 6.5–8.1)

## 2017-03-25 LAB — HEPARIN LEVEL (UNFRACTIONATED): Heparin Unfractionated: <0.1 IU/mL — ABNORMAL LOW (ref 0.30–0.70)

## 2017-03-25 LAB — HEMOGLOBIN AND HEMATOCRIT, BLOOD
HCT: 27.3 % — ABNORMAL LOW (ref 36.0–46.0)
HCT: 27 % — ABNORMAL LOW (ref 36.0–46.0)
Hemoglobin: 9.1 g/dL — ABNORMAL LOW (ref 12.0–15.0)
Hemoglobin: 9.2 g/dL — ABNORMAL LOW (ref 12.0–15.0)

## 2017-03-25 LAB — ACID FAST SMEAR (AFB, MYCOBACTERIA): Acid Fast Smear: NEGATIVE

## 2017-03-25 MED ORDER — HYDRALAZINE HCL 50 MG PO TABS
75.0000 mg | ORAL_TABLET | Freq: Three times a day (TID) | ORAL | Status: DC
  Administered 2017-03-25 – 2017-04-04 (×29): 75 mg via ORAL
  Filled 2017-03-25 (×30): qty 1

## 2017-03-25 MED ORDER — AMIODARONE HCL 200 MG PO TABS
200.0000 mg | ORAL_TABLET | Freq: Two times a day (BID) | ORAL | Status: DC
  Administered 2017-03-25 – 2017-03-26 (×4): 200 mg via ORAL
  Filled 2017-03-25 (×5): qty 1

## 2017-03-25 MED ORDER — MORPHINE SULFATE (PF) 4 MG/ML IV SOLN
4.0000 mg | Freq: Once | INTRAVENOUS | Status: AC
  Administered 2017-03-25: 4 mg via INTRAVENOUS
  Filled 2017-03-25: qty 1

## 2017-03-25 MED ORDER — HYDROMORPHONE HCL 1 MG/ML IJ SOLN
1.0000 mg | INTRAMUSCULAR | Status: DC | PRN
  Administered 2017-03-25 (×2): 1 mg via INTRAVENOUS
  Administered 2017-03-25: 2 mg via INTRAVENOUS
  Administered 2017-03-25: 1 mg via INTRAVENOUS
  Administered 2017-03-26: 2 mg via INTRAVENOUS
  Administered 2017-03-26: 1 mg via INTRAVENOUS
  Administered 2017-03-26 – 2017-03-27 (×3): 2 mg via INTRAVENOUS
  Filled 2017-03-25 (×3): qty 2
  Filled 2017-03-25: qty 1
  Filled 2017-03-25 (×2): qty 2
  Filled 2017-03-25 (×3): qty 1

## 2017-03-25 NOTE — Progress Notes (Signed)
  Speech Language Pathology Treatment: Dysphagia  Patient Details Name: Jocelyn Hill. Jocelyn Hill MRN: [id number] DOB: [birth year] Today's Date: 03/25/2017 Time: 3159-4585 SLP Time Calculation (min) (ACUTE ONLY): 20 min  Assessment / Plan / Recommendation Clinical Impression  SLP followed up for diet tolerance/ readiness for diet upgrade. Pt sleeping upon SLP entrance, SLP able to arouse and pt agreeable to PO trials however lethargy persisted which negatively impacted pts swallow function this date. Pt with delayed cough following thin liquids by straw sip with intermittent trace wet vocal quality. Lunch tray still at bedside with pt noted to consume less than 25 percent. Pt admits to weight loss recently. Pt consumed 4 ounce magic cup supplement and additonal 120 cc of thin liquids. Pt states preference for dysphagia 2 (fine chopped) consistencies and thin liquids. SLP to continue to monitor to ensure tolerance as decline in swallow function exhibited this date.      HPI HPI: Jocelyn Hill. Jocelyn Hill a 66 y.o.femalewith medical history significant for poorly controlled hypertension, GERD, stage III chronic kidney disease, bronchitis, diabetes mellitus, peripheral neuropathy and hypothyroidism admitted with shortness of breath. Found to have a BNP of 1700 with a new diagnosis of CHF. CXR continued bilateral effusions, right greater the left with bibasilar atelectasis or infiltrates.  Per MD note her chest pressure/ache is described as located midsternally with some radiation in all direction and  feeling like something is stuck in her throat. Pt coded 18-Mar-2023 and intubated and extubated 9/19. BSE 03/13/17 with suspected esophageal dysphagia (sope with GI 10 yrs ago), recommended to follow up with GI as outpatient, regular and thin recommended. Recent dental extraction (upper).      SLP Plan  Continue with current plan of care       Recommendations  Diet recommendations: Dysphagia 2 (fine chop);Thin liquid Liquids  provided via: Straw;Cup Supervision: Intermittent supervision to cue for compensatory strategies;Patient able to self feed;Staff to assist with self feeding Compensations: Minimize environmental distractions;Small sips/bites;Slow rate Postural Changes and/or Swallow Maneuvers: Seated upright 90 degrees;Upright 30-60 min after meal                Oral Care Recommendations: Oral care QID SLP Visit Diagnosis: Dysphagia, unspecified (R13.10) Plan: Continue with current plan of care       Hamel MA, Jocelyn Hill 03/25/2017, 3:17 PM

## 2017-03-25 NOTE — Progress Notes (Signed)
PT Cancellation Note  Patient Details Name: Jocelyn Hill. Grall MRN: [id number] DOB: Nov 11, 1950   Cancelled Treatment:    Reason Eval/Treat Not Completed: Medical issues which prohibited therapy. Pt with new retroperitoneal bleed with dropping Hgb.   Shary Decamp Maycok 03/25/2017, 9:26 AM Suanne Marker PT [phone]

## 2017-03-25 NOTE — Progress Notes (Signed)
[facility] Progress Note  Subjective: Had thoracenteses yest of 350- developed retroperitoneal bleed- heparin stopped. 3700 of urine yest- neg 2800- was on lasix  40  q 12 hours, now stopped - crt stable - c/o abd pain    Vitals:   03/25/17 0438 03/25/17 0531 03/25/17 0735 03/25/17 0935  BP:  (!) 175/93 (!) 175/96 (!) 175/96  Pulse:   78   Resp:   14   Temp:   97.6 F (36.4 C)   TempSrc:   Oral   SpO2:   100%   Weight: 65 kg (143 lb 4.8 oz)     Height:        Inpatient medications: . amiodarone  200 mg Oral BID  . amLODipine  10 mg Oral Daily  . aspirin  81 mg Per Tube Daily  . docusate sodium  100 mg Oral BID  . hydrALAZINE  75 mg Oral Q8H  . insulin aspart  0-15 Units Subcutaneous TID WC  . levothyroxine  112 mcg Per Tube QAC breakfast  . mouth rinse  15 mL Mouth Rinse BID  . pantoprazole  40 mg Oral Daily  . potassium chloride  40 mEq Oral BID  . sodium chloride flush  10-40 mL Intracatheter Q12H  . vitamin C  250 mg Oral BID   . ferric gluconate (FERRLECIT/NULECIT) IV 250 mg (03/25/17 0956)   acetaminophen **OR** acetaminophen, bisacodyl, hydrALAZINE, HYDROmorphone (DILAUDID) injection, levalbuterol, LORazepam, ondansetron (ZOFRAN) IV, sennosides, sodium chloride flush  Exam: On nasal O2, not in distress Chest rales R base, L clear RRR no RG, 3/6 sem Abd soft ntnd no ascites Ext no LE edema NF, follows commands   Impression: 1.  Acute on CKD 4 - stable creat and improved from admit (do not know her baseline- reported around 2.1)- worse vol status w/ pulm edema.  Needs heart cath and valve replacement; with CKD stage IV bordering stage V needed higher doses of lasix  to stabilize her first.  Seems to be responding- lost much fluid and now CVP 2-4 so lasix stopped.   they are agreeable to dialysis if needed  but does not appear is needed at this time. 2.  Pulm edema - as above- down to 3 liters [place] from mask- could maybe  be weaned more 3.  Severe MR  - per cards- sounds like cardiac cath and surgical intervention way down the line- needs rehab 4.  HTN - bp's high, vol excess- also on norvasc 10 and hydralazine 75 q 8 (increased)  - lasix stopped 5.  Hx CVA/ depression/ chronic pain 6. Anemia- giving iron- RPB- hgb slight drop- heparin stopped  7. Hypokalemia- repleting- will stop scheduled and replete PRN      Donae Kueker A  03/25/2017, 11:24 AM    Recent Labs Lab 03/19/17 0316  03/20/17 0418 03/21/17 0444  03/23/17 0546 03/24/17 0434 03/24/17 1749 03/25/17 0433  NA 139  < > 140 136  --  136 135  --  134*  K 3.9  < > 3.8 3.4*  < > 3.2* 3.2* 4.1 4.8  CL 108  < > 107 103  --  97* 93*  --  93*  CO2 21*  < > 20* 22  --  31 31  --  31  GLUCOSE 98  < > 133* 165*  --  146* 138*  --  139*  BUN 56*  < > 55* 51*  --  34* 30*  --  28*  CREATININE  3.51*  < > 3.56* 3.00*  --  2.62* 2.73*  --  2.77*  CALCIUM 8.4*  < > 9.6 9.1  --  9.2 9.0  --  9.5  PHOS 5.6*  --  5.1* 3.4  --   --   --   --   --   < > = values in this interval not displayed.  Recent Labs Lab 03/20/17 0418 03/21/17 0444 03/25/17 0433  AST  --  162* 46*  ALT  --  677* 193*  ALKPHOS  --  85 120  BILITOT  --  1.3* 1.1  PROT  --  6.0* 5.6*  ALBUMIN 3.2* 3.2* 3.1*    Recent Labs Lab 03/22/17 0914 03/24/17 0434 03/24/17 2329 03/25/17 0433 03/25/17 0850  WBC 10.6* 11.4*  --  16.2*  --   NEUTROABS 8.8*  --   --   --   --   HGB 9.5* 9.6* 10.0* 9.5* 9.1*  HCT 28.7* 28.4* 28.5* 28.2* 27.3*  MCV 75.7* 75.5*  --  75.8*  --   PLT 222 211  --  229  --    Iron/TIBC/Ferritin/ %Sat    Component Value Date/Time   IRON 62 03/20/2017 0406   TIBC 256 03/20/2017 0406   FERRITIN 148 03/20/2017 0406   IRONPCTSAT 24 03/20/2017 0406

## 2017-03-25 NOTE — Progress Notes (Signed)
Advanced Heart Failure Rounding Note  Primary Cardiologist: Dr. Bettina Gavia Primary HF: New (Dr. Aundra Dubin)  Subjective:    Yesterday she developed abdominal pain. CT abd with R psoas retroperitoneal bleed. Heparin stopped.   S/P R thoracentesis --> 0.35 liters removed.   Denies SOB. Complaining of RLQ abdominal pain.   Objective:   Weight Range: 143 lb 4.8 oz (65 kg) Body mass index is 21.16 kg/m.   Vital Signs:   Temp:  [97.6 F (36.4 C)-98.8 F (37.1 C)] 97.6 F (36.4 C) (09/26 0735) Pulse Rate:  [73-115] 78 (09/26 0735) Resp:  [14-26] 14 (09/26 0735) BP: (123-183)/(78-103) 175/96 (09/26 0735) SpO2:  [93 %-100 %] 100 % (09/26 0735) Weight:  [143 lb 4.8 oz (65 kg)] 143 lb 4.8 oz (65 kg) (09/26 0438) Last BM Date: 03/22/17  Weight change: Filed Weights   03/23/17 0308 03/24/17 0429 03/25/17 0438  Weight: 153 lb 3.5 oz (69.5 kg) 141 lb 12.1 oz (64.3 kg) 143 lb 4.8 oz (65 kg)    Intake/Output:   Intake/Output Summary (Last 24 hours) at 03/25/17 0755 Last data filed at 03/25/17 0413  Gross per 24 hour  Intake            839.3 ml  Output             3700 ml  Net          -2860.7 ml      Physical Exam   CVP 3 General:  No resp difficulty. Appear uncomfortable.  HEENT: normal Neck: supple. JVP flat. Carotids 2+ bilat; no bruits. No lymphadenopathy or thryomegaly appreciated. R IJ  Cor: PMI nondisplaced. Regular rate & rhythm. No rubs, gallops or murmurs. Lungs: clear Abdomen: soft,  RLQ tender, nondistended. No hepatosplenomegaly. No bruits or masses. Good bowel sounds. Extremities: no cyanosis, clubbing, rash, edema Neuro: alert & orientedx3, cranial nerves grossly intact. moves all 4 extremities w/o difficulty. Affect flat GU: Foley yellow urine.     Telemetry  NSR 80s personally reviewed.   EKG    Aflutter with RVR 03/21/17. Personally reviewed  Labs    CBC  Recent Labs  03/22/17 0914 03/24/17 0434 03/24/17 2329 03/25/17 0433  WBC 10.6* 11.4*   --  16.2*  NEUTROABS 8.8*  --   --   --   HGB 9.5* 9.6* 10.0* 9.5*  HCT 28.7* 28.4* 28.5* 28.2*  MCV 75.7* 75.5*  --  75.8*  PLT 222 211  --  678   Basic Metabolic Panel  Recent Labs  03/24/17 0434 03/24/17 1749 03/25/17 0433  NA 135  --  134*  K 3.2* 4.1 4.8  CL 93*  --  93*  CO2 31  --  31  GLUCOSE 138*  --  139*  BUN 30*  --  28*  CREATININE 2.73*  --  2.77*  CALCIUM 9.0  --  9.5  MG 1.9 1.8  --    Liver Function Tests  Recent Labs  03/25/17 0433  AST 46*  ALT 193*  ALKPHOS 120  BILITOT 1.1  PROT 5.6*  ALBUMIN 3.1*   No results for input(s): LIPASE, AMYLASE in the last 72 hours. Cardiac Enzymes No results for input(s): CKTOTAL, CKMB, CKMBINDEX, TROPONINI in the last 72 hours.  BNP: BNP (last 3 results)  Recent Labs  03/12/17 1400  BNP 2,230.5*    ProBNP (last 3 results) No results for input(s): PROBNP in the last 8760 hours.   D-Dimer No results for input(s): DDIMER in the  last 72 hours. Hemoglobin A1C No results for input(s): HGBA1C in the last 72 hours. Fasting Lipid Panel No results for input(s): CHOL, HDL, LDLCALC, TRIG, CHOLHDL, LDLDIRECT in the last 72 hours. Thyroid Function Tests No results for input(s): TSH, T4TOTAL, T3FREE, THYROIDAB in the last 72 hours.  Invalid input(s): FREET3  Other results:   Imaging    Ct Abdomen Pelvis Wo Contrast  Result Date: 03/24/2017 CLINICAL DATA:  Abdominal pain.  Evaluate for appendicitis EXAM: CT ABDOMEN AND PELVIS WITHOUT CONTRAST TECHNIQUE: Multidetector CT imaging of the abdomen and pelvis was performed following the standard protocol without IV contrast. COMPARISON:  None. FINDINGS: Lower chest: There are bilateral pleural effusions noted left greater than right. The airspace densities within the left lower lobe and lingula noted. There is compressive type atelectasis in the right base. Hepatobiliary: No focal liver abnormality. Gallbladder appears distended. No biliary dilatation. No gallstones  identified. Pancreas: Within the limitations of unenhanced technique the pancreas appears unremarkable. Spleen: Normal in size without focal abnormality. Adrenals/Urinary Tract: The right kidney appears normal. Asymmetric atrophy of the left kidney. Normal adrenal glands. The urinary bladder is partially collapsed around a Foley catheter balloon. Stomach/Bowel: The stomach appears normal. The small bowel loops are normal in course and caliber. Normal appearance of the colon. There is a small caliber contrast filled structure within the right lower quadrant which is favored to represent a normal appendix, image 73 of series 3. No pathologic dilatation of the colon. Vascular/Lymphatic: Aortic atherosclerosis. No aneurysm. No adenopathy within the abdomen or pelvis. Reproductive: Uterus is not well visualized and may be atrophic or surgically absent. Other: There is asymmetric, high attenuation enlargement of the right ileo psoas muscle extending into the right pelvic sidewall and right inguinal region compatible with retroperitoneal and extraperitoneal hematoma of the abdomen and pelvis. Hematocrit arrival is identified within the right psoas muscle hematoma, image number 74 of series 3. Hematoma including the psoas muscle measures 6.5 x 5.2 x 14.8 cm. Musculoskeletal: Mild degenerative disc disease identified within the lumbar spine. IMPRESSION: 1. A right retroperitoneal hematoma is suspected extending into the right side of pelvis and right groin region. A hematocrit level is identified within the asymmetrically enlarged right psoas muscle hematoma. 2. The appendix is normal. 3.  Aortic Atherosclerosis (ICD10-I70.0). 4. Gallbladder hydrops. Etiology and clinical significance indeterminate. This could be better assessed with right upper quadrant sonogram. 5. Bilateral pleural effusions with diminished aeration to the lung bases. 6. Critical Value/emergent results were called by telephone at the time of  interpretation on 03/24/2017 at 5:59 pm to Dr. Dia Crawford , who verbally acknowledged these results. Electronically Signed   By: Kerby Moors M.D.   On: 03/24/2017 17:59   Dg Chest 1 View  Result Date: 03/24/2017 CLINICAL DATA:  Post thoracentesis EXAM: CHEST 1 VIEW COMPARISON:  03/23/2017, 03/22/2017 FINDINGS: Right-sided catheter sheath remains in place with kink at the supraclavicular region. The tip projects over the venous confluence. Decreased right-sided pleural effusion with improved aeration of the right lung base. Persistent small left effusion with dense consolidation at the left lung base. Hazy atelectasis or infiltrate at the right lung base. No pneumothorax is seen. Stable cardiomegaly. IMPRESSION: 1. Decreased right pleural effusion post thoracentesis. No definitive pneumothorax seen 2. Continued left pleural effusion with dense left lower lobe atelectasis or pneumonia. Hazy atelectasis or infiltrate at the right base 3. Cardiomegaly unchanged Electronically Signed   By: Donavan Foil M.D.   On: 03/24/2017 16:29   Ir Thoracentesis  Asp Pleural Space W/img Guide  Result Date: 03/24/2017 INDICATION: History of right-sided pleural effusion noted on x-ray. Request is made for diagnostic and therapeutic thoracentesis. EXAM: ULTRASOUND GUIDED DIAGNOSTIC AND THERAPEUTIC THORACENTESIS MEDICATIONS: 1% lidocaine COMPLICATIONS: None immediate. PROCEDURE: An ultrasound guided thoracentesis was thoroughly discussed with the patient and questions answered. The benefits, risks, alternatives and complications were also discussed. The patient understands and wishes to proceed with the procedure. Written consent was obtained. Ultrasound was performed to localize and mark an adequate pocket of fluid in the right chest. The area was then prepped and draped in the normal sterile fashion. 1% Lidocaine was used for local anesthesia. Under ultrasound guidance a Safe-T-Centesis catheter was introduced. Thoracentesis  was performed. The catheter was removed and a dressing applied. FINDINGS: A total of approximately 0.35 L of serous fluid was removed. Samples were sent to the laboratory as requested by the clinical team. IMPRESSION: Successful ultrasound guided right thoracentesis yielding 0.35 L of pleural fluid. Read by: Saverio Danker, PA-C Electronically Signed   By: Aletta Edouard M.D.   On: 03/24/2017 16:38     Medications:     Scheduled Medications: . amLODipine  10 mg Oral Daily  . aspirin  81 mg Per Tube Daily  . docusate sodium  100 mg Oral BID  . furosemide  40 mg Intravenous Q12H  . hydrALAZINE  50 mg Oral Q8H  . insulin aspart  0-15 Units Subcutaneous TID WC  . levothyroxine  112 mcg Per Tube QAC breakfast  . mouth rinse  15 mL Mouth Rinse BID  . pantoprazole  40 mg Oral Daily  . potassium chloride  40 mEq Oral BID  . sodium chloride flush  10-40 mL Intracatheter Q12H  . vitamin C  250 mg Oral BID    Infusions: . amiodarone 30 mg/hr (03/25/17 0407)  . ferric gluconate (FERRLECIT/NULECIT) IV Stopped (03/24/17 1046)    PRN Medications: acetaminophen **OR** acetaminophen, bisacodyl, hydrALAZINE, HYDROmorphone (DILAUDID) injection, levalbuterol, LORazepam, ondansetron (ZOFRAN) IV, sennosides, sodium chloride flush    Patient Profile    Jocelyn Kuehne. Hill is a 66 y.o. female with h/o MR, Diastolic CHF, Afib, CAD s/p MI, long standing HTN, DM2, CKD IV-V, h/o Stroke. Anxiety, depression and GERD.   Pt admitted 03/12/17 with acute hypoxemic respiratory failure and CHF. HF team consulted to optimized prior to MVR consideration.   Assessment/Plan   1. 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  - Dr. Roxy Manns recommends treatment of CHF, Afib, ARF with trialysis cath placement, cardiac cath (L/R), and dental service consultation prior to surgery consideration.  2. Acute on chronic diastolic CHF - Volume status stable. CVP 3. Unchanged. Hold lasix.    3. PAF/Flutter - Converted to NSR on amio 03/23/17. Maintaining NSR.  - Off heparin with hematoma.  Place SCDs for DVT prophylaxis.  - This patients CHA2DS2-VASc Score is at least 8 CHF, HTN, Age, DM, Stroke, and female).  4. CAD s/p MI x 2. - Pt states last MI was 2012-2013. She has not had ischemic work up since then to her recollection.  - Trop peak to 0.11. Likely from demand ischemia from CHF, but needs cath once stabilized prior to MVR consideration. No change.  5. Bradycardia -Resolved.   - s/p temp pacemaker 6. ARF - Nephrology following.  Making >2 liters urine.  - Creatinine unchanged 2.7. BUN 28 7. HTN- Elevated - Increase hydralazine 75 mg tid.  Contine norvasc 10 mg daily. 8. Metabolic encephalopathy  Thought to be related to ARF.  9. R pleural effusion - S/ P R thoracentesis--> -0.35 liters. . 10. Hypokalemia -K 4.8. Stable. 11. Retroperitoneal Hematoma- Had CT of abdomen 9/25 with R Psoas muscle hematoma. Hgb 10>9.5 . Watch closely.  12. ID- WBC trending up. 10>16. Afebrile.     Length of Stay: [place], NP  03/25/2017, 7:55 AM  Advanced Heart Failure Team Pager [phone] (M-F; 7a - 4p)  Please contact [facility] for night-coverage after hours (4p -7a ) and weekends on [email or web address]  Patient seen with NP, agree with the above note.  1. 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 She will eventually need mitral valve intervention, likely replacement. Will need to stabilize her as much as possible medically prior to this. She will need left/right heart cath pre-surgery when creatinine is as stabilized as we can get it.  She is not very mobile at all as of yet, need to see some improvement in this prior to surgery.  Also, has now had RP hematoma.   2. Acute on chronic diastolic CHF: Normal EF on echo with severe MR. CVP down to 3 now.  She has diuresed well. On exam, she does not look volume overloaded. Creatinine stable at 2.7 today. Stop Lasix  today.  3. Atrial fibrillation: Paroxysmal, in setting of rheumatic mitral valve disease. Now in NSR. CHADSVASC = 8 including history of CVA. She was on heparin gtt but stopped due to RP hematoma.  She is in NSR now on amiodarone. Consider Maze with MV surgery.  - Can transition to po amiodarone.  - Will have to hold anticoagulation for the time being.  4. Right pleural effusion: Thoracentesis down, transudate by pleural fluid/serum protein ratio.  5. AKI on CKD stage 3: ?Baseline creatinine around 2.1. Creatinine stable at 2.7 today. When creatinine is as optimized as possible, she will need right/left heart cath with the understanding that renal function could worsen again with the contrast. This is not urgent as she will need considerable rehab prior to surgery.  6. HTN: Continue amlodipine, increase hydralazine today as above.   7. CAD: MI in past. Will need coronary angiography prior to MV surgery.  8. Bradycardia: Resolved.  9. Deconditioning: PT consult, up to chair.  10. RP hematoma: Occurred while on heparin gtt.  Anticoagulation stopped.  Hemoglobin has not fallen markedly but follow closely and transfuse hgb < 8.   [name] 03/25/2017 10:34 AM

## 2017-03-25 NOTE — Progress Notes (Signed)
[place] Kiene  ZOX:[id number] DOB: 10-03-1950 DOA: 03/12/2017 PCP: [name], MD    Brief Narrative:  67yo F w/ a hx of anxiety, depression, CVA, poorly controlled HTN, CKD stage III, DM2 with peripheral neuropathy, and Hypothyroidism who presented w/ a month of gradually worsening dyspnea.  She initially presented to the ED at [facility] where she was found to have a BNP of 1700.  CXR noted moderate bilateral pleural effusions. Because of worsening creatinine and decompensated CHF, it was felt best to transfer her to [facility].   Subjective: The patient had been complaining of some abdominal pain this morning.  Her pain medications were adjusted and this afternoon she tells me her pain is now well controlled.  She denies current shortness of breath fevers chills nausea or vomiting.  She tells me that she feels better in general.  Assessment & Plan:  Acute Respiratory failure with Hypoxia due to pulm edema and B Pleural effusions  Steadily improving w/ diuresis - R thoracentesis 9/25 yielded ~350cc of fluid (exudate per LDH, but transudate per protein)  Acute Diastolic CHF CHF Team addressing this issue   CAD - Elevated troponin / demand ischemia Cards following - to have cardiac cath when renal fxn allows   Parox Afib/flutter To consider Maze w/ MVR - Cards following - NSR at time of exam today   Severe Mitral Valve regurgitation TCTS following, and they have placed Dental consult   Acute on CKD stage III   Care as per Nephrology - baseline crt ~2.1 - UOP is picking up   Recent Labs Lab 03/20/17 0418 03/21/17 0444 03/23/17 0546 03/24/17 0434 03/25/17 0433  CREATININE 3.56* 3.00* 2.62* 2.73* 2.77*   Chronic anemia No overt sign of bleeding - likely due to CKD      DM2 9/13 A1c 6.2 - CBG well controlled   Hypothyroidism Cont home synthroid dose   Acute encephalopathy  Multifactorial: hypoperfusion, bradycardia,  depression, chest pain - resolved  Hypokalemia Due to diuresis - replace gently and follow   Transamintis Likely hepatic congestion in setting of CHF - acute hepatitis panel negative - LFTs improving    Right retroperitoneal hematoma/Abdominal pain Noted via CT abdom 9/25 - Hgb holding steady - pain better controlled, but must utilize care w/ dilaudid if crt does not soon improve   DVT prophylaxis: IV heparin Code Status: FULL CODE Family Communication: no family present at time of exam  Disposition Plan: SDU   Consultants:  CHF Team  Cardiology  TCTS PCCM  Procedures: 9/13 TTE - EF 60-65% - grade 2 DD - severe MR - mildly dilated LA and RA 9/18 TEE - severe regurgitation MV  Antimicrobials:  Azithromycin 9/15 > 9/17  Objective: Blood pressure (!) 167/80, pulse 70, temperature 99.2 F (37.3 C), temperature source Axillary, resp. rate 19, height 5\' 9"  (1.753 m), weight 65 kg (143 lb 4.8 oz), SpO2 100 %.  Intake/Output Summary (Last 24 hours) at 03/25/17 1714 Last data filed at 03/25/17 1139  Gross per 24 hour  Intake           969.59 ml  Output             2650 ml  Net         -1680.41 ml   Filed Weights   03/23/17 0308 03/24/17 0429 03/25/17 0438  Weight: 69.5 kg (153 lb 3.5 oz) 64.3 kg (141 lb 12.1 oz) 65 kg (143 lb  4.8 oz)    Examination: General: No acute respiratory distress - alert  Lungs: no wheezing or focal crackles  Cardiovascular: RRR Abdomen: Nondistended, soft, bowel sounds positive Extremities: No signif edema bilateral lower extremities  CBC:  Recent Labs Lab 03/20/17 0406 03/21/17 0444 03/22/17 0914 03/24/17 0434 03/24/17 2329 03/25/17 0433 03/25/17 0850  WBC 11.0* 12.1* 10.6* 11.4*  --  16.2*  --   NEUTROABS  --   --  8.8*  --   --   --   --   HGB 9.3* 9.5* 9.5* 9.6* 10.0* 9.5* 9.1*  HCT 27.4* 28.3* 28.7* 28.4* 28.5* 28.2* 27.3*  MCV 76.1* 75.3* 75.7* 75.5*  --  75.8*  --   PLT 234 243 222 211  --  229  --    Basic Metabolic  Panel:  Recent Labs Lab 03/19/17 0316  03/20/17 0418 03/21/17 0444 03/21/17 1345 03/22/17 0914 03/23/17 0546 03/24/17 0434 03/24/17 1749 03/25/17 0433  NA 139  < > 140 136  --   --  136 135  --  134*  K 3.9  < > 3.8 3.4* 3.7  --  3.2* 3.2* 4.1 4.8  CL 108  < > 107 103  --   --  97* 93*  --  93*  CO2 21*  < > 20* 22  --   --  31 31  --  31  GLUCOSE 98  < > 133* 165*  --   --  146* 138*  --  139*  BUN 56*  < > 55* 51*  --   --  34* 30*  --  28*  CREATININE 3.51*  < > 3.56* 3.00*  --   --  2.62* 2.73*  --  2.77*  CALCIUM 8.4*  < > 9.6 9.1  --   --  9.2 9.0  --  9.5  MG 2.1  < >  --   --  2.0 1.9 1.7 1.9 1.8  --   PHOS 5.6*  --  5.1* 3.4  --   --   --   --   --   --   < > = values in this interval not displayed. GFR: Estimated Creatinine Clearance: 20.8 mL/min (A) (by C-G formula based on SCr of 2.77 mg/dL (H)).  Liver Function Tests:  Recent Labs Lab 03/19/17 0316 03/20/17 0418 03/21/17 0444 03/25/17 0433  AST  --   --  162* 46*  ALT  --   --  677* 193*  ALKPHOS  --   --  85 120  BILITOT  --   --  1.3* 1.1  PROT  --   --  6.0* 5.6*  ALBUMIN 3.0* 3.2* 3.2* 3.1*    Coagulation Profile:  Recent Labs Lab 03/24/17 0753  INR 1.16    Cardiac Enzymes:  Recent Labs Lab 03/19/17 1334 03/19/17 1930 03/20/17 0100  TROPONINI 0.10* 0.11* 0.11*    HbA1C: Hgb A1c MFr Bld  Date/Time Value Ref Range Status  03/12/2017 02:00 PM 6.2 (H) 4.8 - 5.6 % Final    Comment:    (NOTE) Pre diabetes:          5.7%-6.4% Diabetes:              >6.4% Glycemic control for   <7.0% adults with diabetes     CBG:  Recent Labs Lab 03/24/17 1742 03/24/17 2144 03/25/17 0742 03/25/17 1124 03/25/17 1602  GLUCAP 161* 168* 157* 135* 127*    Recent Results (from the  past 240 hour(s))  MRSA PCR Screening     Status: None   Collection Time: 03/15/17  8:32 PM  Result Value Ref Range Status   MRSA by PCR NEGATIVE NEGATIVE Final    Comment:        The GeneXpert MRSA Assay  (FDA approved for NASAL specimens only), is one component of a comprehensive MRSA colonization surveillance program. It is not intended to diagnose MRSA infection nor to guide or monitor treatment for MRSA infections.   Gram stain     Status: None   Collection Time: 03/24/17  4:23 PM  Result Value Ref Range Status   Specimen Description FLUID PLEURAL RIGHT  Final   Special Requests NONE  Final   Gram Stain   Final    WBC PRESENT, PREDOMINANTLY MONONUCLEAR NO ORGANISMS SEEN CYTOSPIN SMEAR    Report Status 03/24/2017 FINAL  Final  Acid Fast Smear (AFB)     Status: None   Collection Time: 03/24/17  4:23 PM  Result Value Ref Range Status   AFB Specimen Processing Concentration  Final   Acid Fast Smear Negative  Final    Comment: (NOTE) Performed At: [facility] [address] [place], [place] [id number] Lindon Romp MD [id number]    Source (AFB) FLUID  Final    Comment: PLEURAL RIGHT   Culture, body fluid-bottle     Status: None (Preliminary result)   Collection Time: 03/24/17  4:23 PM  Result Value Ref Range Status   Specimen Description FLUID PLEURAL RIGHT  Final   Special Requests BOTTLES DRAWN AEROBIC AND ANAEROBIC  Final   Culture NO GROWTH < 24 HOURS  Final   Report Status PENDING  Incomplete     Scheduled Meds: . amiodarone  200 mg Oral BID  . amLODipine  10 mg Oral Daily  . aspirin  81 mg Per Tube Daily  . docusate sodium  100 mg Oral BID  . hydrALAZINE  75 mg Oral Q8H  . insulin aspart  0-15 Units Subcutaneous TID WC  . levothyroxine  112 mcg Per Tube QAC breakfast  . mouth rinse  15 mL Mouth Rinse BID  . pantoprazole  40 mg Oral Daily  . sodium chloride flush  10-40 mL Intracatheter Q12H  . vitamin C  250 mg Oral BID     LOS: 13 days   Cherene Altes, MD [facility] Office  [phone] Pager - Text Page per Amion as per below:  On-Call/Text Page:      [email or web address]      password [email or web address]  If 7PM-7AM, please contact  night-coverage [email or web address] Password [email or web address] 03/25/2017, 5:14 PM

## 2017-03-26 ENCOUNTER — Inpatient Hospital Stay (HOSPITAL_COMMUNITY): Payer: Medicare Other

## 2017-03-26 DIAGNOSIS — A419 Sepsis, unspecified organism: Secondary | ICD-10-CM

## 2017-03-26 DIAGNOSIS — K661 Hemoperitoneum: Secondary | ICD-10-CM

## 2017-03-26 DIAGNOSIS — N170 Acute kidney failure with tubular necrosis: Secondary | ICD-10-CM

## 2017-03-26 LAB — URINALYSIS, ROUTINE W REFLEX MICROSCOPIC
Bilirubin Urine: NEGATIVE
Glucose, UA: NEGATIVE mg/dL
Ketones, ur: NEGATIVE mg/dL
Nitrite: NEGATIVE
Protein, ur: >300 mg/dL — AB
Specific Gravity, Urine: 1.01 (ref 1.005–1.030)
pH: 6.5 (ref 5.0–8.0)

## 2017-03-26 LAB — MAGNESIUM
Magnesium: 1.8 mg/dL (ref 1.7–2.4)
Magnesium: 1.8 mg/dL (ref 1.7–2.4)
Magnesium: 1.6 mg/dL — ABNORMAL LOW (ref 1.7–2.4)

## 2017-03-26 LAB — BASIC METABOLIC PANEL
Anion gap: 12 (ref 5–15)
Anion gap: 10 (ref 5–15)
BUN: 30 mg/dL — ABNORMAL HIGH (ref 6–20)
BUN: 36 mg/dL — ABNORMAL HIGH (ref 6–20)
CO2: 26 mmol/L (ref 22–32)
CO2: 26 mmol/L (ref 22–32)
Calcium: 9.4 mg/dL (ref 8.9–10.3)
Calcium: 9.3 mg/dL (ref 8.9–10.3)
Chloride: 93 mmol/L — ABNORMAL LOW (ref 101–111)
Chloride: 94 mmol/L — ABNORMAL LOW (ref 101–111)
Creatinine, Ser: 3.26 mg/dL — ABNORMAL HIGH (ref 0.44–1.00)
Creatinine, Ser: 3.61 mg/dL — ABNORMAL HIGH (ref 0.44–1.00)
GFR calc Af Amer: 16 mL/min — ABNORMAL LOW (ref >60)
GFR calc Af Amer: 14 mL/min — ABNORMAL LOW (ref >60)
GFR calc non Af Amer: 14 mL/min — ABNORMAL LOW (ref >60)
GFR calc non Af Amer: 12 mL/min — ABNORMAL LOW (ref >60)
Glucose, Bld: 202 mg/dL — ABNORMAL HIGH (ref 65–99)
Glucose, Bld: 165 mg/dL — ABNORMAL HIGH (ref 65–99)
Potassium: 5.8 mmol/L — ABNORMAL HIGH (ref 3.5–5.1)
Potassium: 5.9 mmol/L — ABNORMAL HIGH (ref 3.5–5.1)
Sodium: 131 mmol/L — ABNORMAL LOW (ref 135–145)
Sodium: 130 mmol/L — ABNORMAL LOW (ref 135–145)

## 2017-03-26 LAB — URINALYSIS, MICROSCOPIC (REFLEX)

## 2017-03-26 LAB — GLUCOSE, CAPILLARY
Glucose-Capillary: 223 mg/dL — ABNORMAL HIGH (ref 65–99)
Glucose-Capillary: 129 mg/dL — ABNORMAL HIGH (ref 65–99)
Glucose-Capillary: 182 mg/dL — ABNORMAL HIGH (ref 65–99)
Glucose-Capillary: 233 mg/dL — ABNORMAL HIGH (ref 65–99)

## 2017-03-26 LAB — COMPREHENSIVE METABOLIC PANEL
ALT: 121 U/L — ABNORMAL HIGH (ref 14–54)
AST: 39 U/L (ref 15–41)
Albumin: 2.7 g/dL — ABNORMAL LOW (ref 3.5–5.0)
Alkaline Phosphatase: 109 U/L (ref 38–126)
Anion gap: 13 (ref 5–15)
BUN: 33 mg/dL — ABNORMAL HIGH (ref 6–20)
CO2: 25 mmol/L (ref 22–32)
Calcium: 9.7 mg/dL (ref 8.9–10.3)
Chloride: 93 mmol/L — ABNORMAL LOW (ref 101–111)
Creatinine, Ser: 3.62 mg/dL — ABNORMAL HIGH (ref 0.44–1.00)
GFR calc Af Amer: 14 mL/min — ABNORMAL LOW (ref >60)
GFR calc non Af Amer: 12 mL/min — ABNORMAL LOW (ref >60)
Glucose, Bld: 144 mg/dL — ABNORMAL HIGH (ref 65–99)
Potassium: 5.9 mmol/L — ABNORMAL HIGH (ref 3.5–5.1)
Sodium: 131 mmol/L — ABNORMAL LOW (ref 135–145)
Total Bilirubin: 1 mg/dL (ref 0.3–1.2)
Total Protein: 5.8 g/dL — ABNORMAL LOW (ref 6.5–8.1)

## 2017-03-26 LAB — CBC
HCT: 24.4 % — ABNORMAL LOW (ref 36.0–46.0)
HCT: 23 % — ABNORMAL LOW (ref 36.0–46.0)
Hemoglobin: 8.5 g/dL — ABNORMAL LOW (ref 12.0–15.0)
Hemoglobin: 8.1 g/dL — ABNORMAL LOW (ref 12.0–15.0)
MCH: 26.6 pg (ref 26.0–34.0)
MCH: 26.7 pg (ref 26.0–34.0)
MCHC: 34.8 g/dL (ref 30.0–36.0)
MCHC: 35.2 g/dL (ref 30.0–36.0)
MCV: 76.5 fL — ABNORMAL LOW (ref 78.0–100.0)
MCV: 75.9 fL — ABNORMAL LOW (ref 78.0–100.0)
Platelets: 191 10*3/uL (ref 150–400)
Platelets: 170 10*3/uL (ref 150–400)
RBC: 3.19 MIL/uL — ABNORMAL LOW (ref 3.87–5.11)
RBC: 3.03 MIL/uL — ABNORMAL LOW (ref 3.87–5.11)
RDW: 15.1 % (ref 11.5–15.5)
RDW: 15.3 % (ref 11.5–15.5)
WBC: 49.8 10*3/uL — ABNORMAL HIGH (ref 4.0–10.5)
WBC: 51.4 10*3/uL (ref 4.0–10.5)

## 2017-03-26 LAB — PROCALCITONIN: Procalcitonin: 30.03 ng/mL

## 2017-03-26 LAB — PROTIME-INR
INR: 1.33
INR: 1.34
Prothrombin Time: 16.3 seconds — ABNORMAL HIGH (ref 11.4–15.2)
Prothrombin Time: 16.5 seconds — ABNORMAL HIGH (ref 11.4–15.2)

## 2017-03-26 LAB — POTASSIUM: Potassium: 5.7 mmol/L — ABNORMAL HIGH (ref 3.5–5.1)

## 2017-03-26 LAB — LACTIC ACID, PLASMA
Lactic Acid, Venous: 3.7 mmol/L (ref 0.5–1.9)
Lactic Acid, Venous: 1.3 mmol/L (ref 0.5–1.9)
Lactic Acid, Venous: 1.5 mmol/L (ref 0.5–1.9)

## 2017-03-26 LAB — APTT: aPTT: 63 seconds — ABNORMAL HIGH (ref 24–36)

## 2017-03-26 MED ORDER — PIPERACILLIN-TAZOBACTAM 3.375 G IVPB 30 MIN
3.3750 g | Freq: Once | INTRAVENOUS | Status: DC
  Filled 2017-03-26: qty 50

## 2017-03-26 MED ORDER — HALOPERIDOL LACTATE 5 MG/ML IJ SOLN
2.0000 mg | Freq: Four times a day (QID) | INTRAMUSCULAR | Status: DC | PRN
  Administered 2017-03-26: 2 mg via INTRAVENOUS
  Filled 2017-03-26: qty 1

## 2017-03-26 MED ORDER — VANCOMYCIN HCL IN DEXTROSE 1-5 GM/200ML-% IV SOLN
1000.0000 mg | Freq: Once | INTRAVENOUS | Status: DC
  Filled 2017-03-26: qty 200

## 2017-03-26 MED ORDER — PATIROMER SORBITEX CALCIUM 8.4 G PO PACK
8.4000 g | PACK | Freq: Once | ORAL | Status: AC
  Administered 2017-03-26: 8.4 g via ORAL
  Filled 2017-03-26: qty 4

## 2017-03-26 MED ORDER — INSULIN GLARGINE 100 UNIT/ML ~~LOC~~ SOLN
5.0000 [IU] | Freq: Every day | SUBCUTANEOUS | Status: DC
  Administered 2017-03-26 – 2017-03-30 (×5): 5 [IU] via SUBCUTANEOUS
  Filled 2017-03-26 (×5): qty 0.05

## 2017-03-26 MED ORDER — SODIUM POLYSTYRENE SULFONATE 15 GM/60ML PO SUSP: 45.0000 g | Freq: Once | ORAL | Status: DC

## 2017-03-26 MED ORDER — VANCOMYCIN HCL IN DEXTROSE 750-5 MG/150ML-% IV SOLN
750.0000 mg | INTRAVENOUS | Status: DC
  Administered 2017-03-26: 750 mg via INTRAVENOUS
  Filled 2017-03-26: qty 150

## 2017-03-26 MED ORDER — PIPERACILLIN-TAZOBACTAM IN DEX 2-0.25 GM/50ML IV SOLN
2.2500 g | Freq: Three times a day (TID) | INTRAVENOUS | Status: DC
  Administered 2017-03-26 – 2017-03-27 (×3): 2.25 g via INTRAVENOUS
  Filled 2017-03-26 (×5): qty 50

## 2017-03-26 NOTE — Progress Notes (Signed)
[facility] Progress Note  Subjective:  Still dealing with retroperitoneal bleed- heparin stopped. 1800 of urine yest- neg 800- no lasix  crt worse - c/o abd pain - hgb down some but WBC is 49 k ! And K of 5.8- BP if anything is up - is in much pain   Vitals:   03/26/17 0305 03/26/17 0500 03/26/17 0508 03/26/17 0626  BP: (!) 147/70  (!) 178/70 (!) 162/70  Pulse: 85     Resp: (!) 23     Temp: 99 F (37.2 C)     TempSrc: Axillary     SpO2:      Weight:  59 kg (130 lb 1.1 oz)    Height:        Inpatient medications: . amiodarone  200 mg Oral BID  . amLODipine  10 mg Oral Daily  . aspirin  81 mg Per Tube Daily  . docusate sodium  100 mg Oral BID  . hydrALAZINE  75 mg Oral Q8H  . insulin aspart  0-15 Units Subcutaneous TID WC  . levothyroxine  112 mcg Per Tube QAC breakfast  . mouth rinse  15 mL Mouth Rinse BID  . pantoprazole  40 mg Oral Daily  . sodium chloride flush  10-40 mL Intracatheter Q12H  . vitamin C  250 mg Oral BID   . ferric gluconate (FERRLECIT/NULECIT) IV Stopped (03/25/17 1056)   acetaminophen **OR** acetaminophen, bisacodyl, hydrALAZINE, HYDROmorphone (DILAUDID) injection, levalbuterol, LORazepam, ondansetron (ZOFRAN) IV, sennosides, sodium chloride flush  Exam: On nasal O2, not in distress Chest rales R base, L clear RRR no RG, 3/6 sem Abd soft ntnd no ascites Ext no LE edema NF, follows commands   Impression: 1.  Acute on CKD 4 - stable worse today ( baseline- reported around 2.1)- previous worse vol status w/ pulm edema.  In need of heart cath and valve replacement; with CKD stage IV bordering stage V needed higher doses of lasix  to stabilize her first.   responded- lost much fluid and now CVP 2-4 so lasix stopped.   they are agreeable to dialysis if needed. Do not like this trend in creatinine- did not have hypotension- could hematoma be obstructing ?? Recheck CT to eval 2.  Pulm edema - resolved 3.  Severe MR - per cards- sounds like  cardiac cath and surgical intervention way down the line- needs rehab 4.  HTN - bp's high, vol excess- also on norvasc 10 and hydralazine 75 q 8 (increased)  - lasix stopped 5.  Hx CVA/ depression/ chronic pain 6. Anemia- giving iron- RPB- hgb drop- heparin stopped - con to follow 7. Hypokalemia- had been repleting- stop scheduled yesterday- now needs veltassa to bring down- recheck later today  8. Elevated WBC- unsure etiology- cultures being ordered, CT can also eval for abscess      Jolie Strohecker A  03/26/2017, 7:56 AM    Recent Labs Lab 03/20/17 0418 03/21/17 0444  03/24/17 0434 03/24/17 1749 03/25/17 0433 03/26/17 0424  NA 140 136  < > 135  --  134* 131*  K 3.8 3.4*  < > 3.2* 4.1 4.8 5.8*  CL 107 103  < > 93*  --  93* 93*  CO2 20* 22  < > 31  --  31 26  GLUCOSE 133* 165*  < > 138*  --  139* 202*  BUN 55* 51*  < > 30*  --  28* 30*  CREATININE 3.56* 3.00*  < > 2.73*  --  2.77* 3.26*  CALCIUM 9.6 9.1  < > 9.0  --  9.5 9.4  PHOS 5.1* 3.4  --   --   --   --   --   < > = values in this interval not displayed.  Recent Labs Lab 03/20/17 0418 03/21/17 0444 03/25/17 0433  AST  --  162* 46*  ALT  --  677* 193*  ALKPHOS  --  85 120  BILITOT  --  1.3* 1.1  PROT  --  6.0* 5.6*  ALBUMIN 3.2* 3.2* 3.1*    Recent Labs Lab 03/22/17 0914 03/24/17 0434  03/25/17 0433 03/25/17 0850 03/25/17 1800 03/26/17 0424  WBC 10.6* 11.4*  --  16.2*  --   --  49.8*  NEUTROABS 8.8*  --   --   --   --   --   --   HGB 9.5* 9.6*  < > 9.5* 9.1* 9.2* 8.5*  HCT 28.7* 28.4*  < > 28.2* 27.3* 27.0* 24.4*  MCV 75.7* 75.5*  --  75.8*  --   --  76.5*  PLT 222 211  --  229  --   --  191  < > = values in this interval not displayed. Iron/TIBC/Ferritin/ %Sat    Component Value Date/Time   IRON 62 03/20/2017 0406   TIBC 256 03/20/2017 0406   FERRITIN 148 03/20/2017 0406   IRONPCTSAT 24 03/20/2017 0406

## 2017-03-26 NOTE — Consult Note (Addendum)
H&P Physician requesting consult: Dia Crawford, MD  Chief Complaint: right hydronephrosis, acute renal insufficiency  History of Present Illness: this is a 66 year old female critically ill patient who is currently admitted to the medical ICU.he was initially transferred here from an outside hospital for severe CHF with acute on chronic renal insufficiency.she is currently being treated for multiple medical issues including sepsis, acute respiratory failure currently on 10 L of oxygen, substance abuse, congestive heart failure, severe mitral valve regurgitation, atrial fibrillation,acute encephalopathy,and diabetes.In addition to this the patient was started on a heparin drip several days ago. She subsequently developed a right retroperitoneal hematoma and acute blood loss anemia and therefore her heparin drip had to be stopped.her acute renal insufficiency initially improved closer to baseline. However after the retroperitoneum bleed, her creatinine began to increase again.her creatinine is up to 3.61. White blood cell count is 51.4  CT scan was performed today which revealed an atrophic left kidney as well as right hydronephrosis likely secondary to compression from the retroperitoneal hematoma.  On my exam currently, the patient has received Haldol, and she is minimally engaged. She was experiencing some delirium. She was apparently given Haldol due to combativeness.   Past Medical History:  Diagnosis Date  . Anxiety   . Arthritis   . Bronchitis   . Depression   . Diabetes mellitus without complication ([place])   . Diet-controlled diabetes mellitus ([place])   . GERD (gastroesophageal reflux disease)   . Hyperlipidemia   . Hypertension   . Hypothyroidism   . Stroke [facility])    Past Surgical History:  Procedure Laterality Date  . ABDOMINAL HYSTERECTOMY     PARTIALS  . IR THORACENTESIS ASP PLEURAL SPACE W/IMG GUIDE  03/24/2017  . TONSILLECTOMY    . TUBAL LIGATION      Home Medications:   Prescriptions Prior to Admission  Medication Sig Dispense Refill Last Dose  . albuterol (PROVENTIL HFA;VENTOLIN HFA) 108 (90 Base) MCG/ACT inhaler Inhale 2 puffs into the lungs every 4 (four) hours as needed for wheezing or shortness of breath.   03/11/2017 at Unknown time  . amLODipine (NORVASC) 10 MG tablet Take 10 mg by mouth daily.   03/11/2017 at Unknown time  . aspirin EC 81 MG tablet Take 81 mg by mouth daily.   03/11/2017 at Unknown time  . atorvastatin (LIPITOR) 40 MG tablet Take 40 mg by mouth daily.   03/11/2017 at Unknown time  . b complex vitamins capsule Take 1 capsule by mouth daily.   03/11/2017 at Unknown time  . buPROPion (WELLBUTRIN XL) 300 MG 24 hr tablet Take 300 mg by mouth daily.   03/11/2017 at Unknown time  . cholecalciferol (VITAMIN D) 1000 units tablet Take 1,000 Units by mouth daily.   03/11/2017 at Unknown time  . DULoxetine (CYMBALTA) 30 MG capsule Take 30 mg by mouth daily.   03/11/2017 at Unknown time  . gabapentin (NEURONTIN) 300 MG capsule Take 300 mg by mouth at bedtime.   03/09/17  . hydrALAZINE (APRESOLINE) 100 MG tablet Take 100 mg by mouth 3 (three) times daily.     Marland Kitchen labetalol (NORMODYNE) 200 MG tablet Take 200 mg by mouth 2 (two) times daily.     Marland Kitchen levothyroxine (SYNTHROID, LEVOTHROID) 112 MCG tablet Take 112 mcg by mouth daily before breakfast.   03/11/2017 at Unknown time  . minoxidil (LONITEN) 2.5 MG tablet Take by mouth daily.   03/11/2017 at Unknown time  . montelukast (SINGULAIR) 10 MG tablet Take 10 mg by  mouth daily.   03/11/2017 at Unknown time  . Oxycodone HCl 10 MG TABS Take 10 mg by mouth 3 (three) times daily as needed (pain).   03/11/2017 at Unknown time  . pantoprazole (PROTONIX) 40 MG tablet Take 40 mg by mouth daily.   03/11/2017 at Unknown time  . potassium gluconate (HM POTASSIUM) 595 (99 K) MG TABS tablet Take 595 mg by mouth daily.   03/11/2017 at Unknown time  . pregabalin (LYRICA) 150 MG capsule Take 150 mg by mouth daily.   03/11/2017 at Unknown  time  . tiZANidine (ZANAFLEX) 4 MG capsule Take 4 mg by mouth 3 (three) times daily.   03/11/2017 at Unknown time  . zolpidem (AMBIEN) 10 MG tablet Take 10 mg by mouth at bedtime as needed for sleep.   Past Week at Unknown time  . busPIRone (BUSPAR) 15 MG tablet Take 15 mg by mouth 2 (two) times daily.      Allergies:  Allergies  Allergen Reactions  . Ace Inhibitors Anaphylaxis and Swelling    History reviewed. No pertinent family history. Social History:  reports that she has been smoking Cigarettes.  She has never used smokeless tobacco. She reports that she does not drink alcohol or use drugs.  ROS: Patient was unable to provide a reliable review of systems ROS   Physical Exam:  Vital signs in last 24 hours: Temp:  [98.7 F (37.1 C)-99 F (37.2 C)] 98.7 F (37.1 C) (09/27 1700) Pulse Rate:  [54-99] 54 (09/27 1151) Resp:  [17-23] 23 (09/27 0305) BP: (128-178)/(58-126) 141/58 (09/27 2014) SpO2:  [95 %] 95 % (09/26 2300) Weight:  [59 kg (130 lb 1.1 oz)] 59 kg (130 lb 1.1 oz) (09/27 0500)  General:  Alert but not oriented, No acute distress, moaning in the bed HEENT: Normocephalic, atraumatic Cardiovascular: Regular rate and rhythm Lungs: Regular rate and effort Abdomen: Soft, nondistended, no abdominal masses, she appears to have some degree of tenderness to palpation of her abdomen. There are no peritoneal signs Back: she appears to possibly have mild right CVA tenderness GU: foley in place draining yellow urine Extremities: No edema Neurologic: awake but not oriented.  Laboratory Data:  Results for orders placed or performed during the hospital encounter of 03/12/17 (from the past 24 hour(s))  Basic metabolic panel     Status: Abnormal   Collection Time: 03/26/17  4:24 AM  Result Value Ref Range   Sodium 131 (L) 135 - 145 mmol/L   Potassium 5.8 (H) 3.5 - 5.1 mmol/L   Chloride 93 (L) 101 - 111 mmol/L   CO2 26 22 - 32 mmol/L   Glucose, Bld 202 (H) 65 - 99 mg/dL   BUN  30 (H) 6 - 20 mg/dL   Creatinine, Ser 3.26 (H) 0.44 - 1.00 mg/dL   Calcium 9.4 8.9 - 10.3 mg/dL   GFR calc non Af Amer 14 (L) >60 mL/min   GFR calc Af Amer 16 (L) >60 mL/min   Anion gap 12 5 - 15  CBC     Status: Abnormal   Collection Time: 03/26/17  4:24 AM  Result Value Ref Range   WBC 49.8 (H) 4.0 - 10.5 K/uL   RBC 3.19 (L) 3.87 - 5.11 MIL/uL   Hemoglobin 8.5 (L) 12.0 - 15.0 g/dL   HCT 24.4 (L) 36.0 - 46.0 %   MCV 76.5 (L) 78.0 - 100.0 fL   MCH 26.6 26.0 - 34.0 pg   MCHC 34.8 30.0 - 36.0 g/dL  RDW 15.1 11.5 - 15.5 %   Platelets 191 150 - 400 K/uL  CBC     Status: Abnormal   Collection Time: 03/26/17  8:05 AM  Result Value Ref Range   WBC 51.4 (HH) 4.0 - 10.5 K/uL   RBC 3.03 (L) 3.87 - 5.11 MIL/uL   Hemoglobin 8.1 (L) 12.0 - 15.0 g/dL   HCT 23.0 (L) 36.0 - 46.0 %   MCV 75.9 (L) 78.0 - 100.0 fL   MCH 26.7 26.0 - 34.0 pg   MCHC 35.2 30.0 - 36.0 g/dL   RDW 15.3 11.5 - 15.5 %   Platelets 170 150 - 400 K/uL  Glucose, capillary     Status: Abnormal   Collection Time: 03/26/17  8:46 AM  Result Value Ref Range   Glucose-Capillary 223 (H) 65 - 99 mg/dL  Magnesium     Status: None   Collection Time: 03/26/17  9:53 AM  Result Value Ref Range   Magnesium 1.8 1.7 - 2.4 mg/dL  Comprehensive metabolic panel     Status: Abnormal   Collection Time: 03/26/17  9:53 AM  Result Value Ref Range   Sodium 131 (L) 135 - 145 mmol/L   Potassium 5.9 (H) 3.5 - 5.1 mmol/L   Chloride 93 (L) 101 - 111 mmol/L   CO2 25 22 - 32 mmol/L   Glucose, Bld 144 (H) 65 - 99 mg/dL   BUN 33 (H) 6 - 20 mg/dL   Creatinine, Ser 3.62 (H) 0.44 - 1.00 mg/dL   Calcium 9.7 8.9 - 10.3 mg/dL   Total Protein 5.8 (L) 6.5 - 8.1 g/dL   Albumin 2.7 (L) 3.5 - 5.0 g/dL   AST 39 15 - 41 U/L   ALT 121 (H) 14 - 54 U/L   Alkaline Phosphatase 109 38 - 126 U/L   Total Bilirubin 1.0 0.3 - 1.2 mg/dL   GFR calc non Af Amer 12 (L) >60 mL/min   GFR calc Af Amer 14 (L) >60 mL/min   Anion gap 13 5 - 15  Lactic acid, plasma      Status: Abnormal   Collection Time: 03/26/17  9:53 AM  Result Value Ref Range   Lactic Acid, Venous 3.7 (HH) 0.5 - 1.9 mmol/L  Lactic acid, plasma     Status: None   Collection Time: 03/26/17 11:32 AM  Result Value Ref Range   Lactic Acid, Venous 1.3 0.5 - 1.9 mmol/L  Procalcitonin     Status: None   Collection Time: 03/26/17 11:32 AM  Result Value Ref Range   Procalcitonin 30.03 ng/mL  Protime-INR     Status: Abnormal   Collection Time: 03/26/17 11:32 AM  Result Value Ref Range   Prothrombin Time 16.3 (H) 11.4 - 15.2 seconds   INR 1.33   APTT     Status: Abnormal   Collection Time: 03/26/17 11:32 AM  Result Value Ref Range   aPTT 63 (H) 24 - 36 seconds  Glucose, capillary     Status: Abnormal   Collection Time: 03/26/17 11:55 AM  Result Value Ref Range   Glucose-Capillary 129 (H) 65 - 99 mg/dL  Lactic acid, plasma     Status: None   Collection Time: 03/26/17  2:32 PM  Result Value Ref Range   Lactic Acid, Venous 1.5 0.5 - 1.9 mmol/L  Basic metabolic panel     Status: Abnormal   Collection Time: 03/26/17  3:00 PM  Result Value Ref Range   Sodium 130 (L) 135 - 145  mmol/L   Potassium 5.9 (H) 3.5 - 5.1 mmol/L   Chloride 94 (L) 101 - 111 mmol/L   CO2 26 22 - 32 mmol/L   Glucose, Bld 165 (H) 65 - 99 mg/dL   BUN 36 (H) 6 - 20 mg/dL   Creatinine, Ser 3.61 (H) 0.44 - 1.00 mg/dL   Calcium 9.3 8.9 - 10.3 mg/dL   GFR calc non Af Amer 12 (L) >60 mL/min   GFR calc Af Amer 14 (L) >60 mL/min   Anion gap 10 5 - 15  Magnesium     Status: None   Collection Time: 03/26/17  3:00 PM  Result Value Ref Range   Magnesium 1.8 1.7 - 2.4 mg/dL  Urinalysis, Routine w reflex microscopic     Status: Abnormal   Collection Time: 03/26/17  3:39 PM  Result Value Ref Range   Color, Urine YELLOW YELLOW   APPearance CLOUDY (A) CLEAR   Specific Gravity, Urine 1.010 1.005 - 1.030   pH 6.5 5.0 - 8.0   Glucose, UA NEGATIVE NEGATIVE mg/dL   Hgb urine dipstick LARGE (A) NEGATIVE   Bilirubin Urine  NEGATIVE NEGATIVE   Ketones, ur NEGATIVE NEGATIVE mg/dL   Protein, ur >300 (A) NEGATIVE mg/dL   Nitrite NEGATIVE NEGATIVE   Leukocytes, UA LARGE (A) NEGATIVE  Urinalysis, Microscopic (reflex)     Status: Abnormal   Collection Time: 03/26/17  3:39 PM  Result Value Ref Range   RBC / HPF 6-30 0 - 5 RBC/hpf   WBC, UA 6-30 0 - 5 WBC/hpf   Bacteria, UA MANY (A) NONE SEEN   Squamous Epithelial / LPF 0-5 (A) NONE SEEN  Glucose, capillary     Status: Abnormal   Collection Time: 03/26/17  5:29 PM  Result Value Ref Range   Glucose-Capillary 182 (H) 65 - 99 mg/dL  Glucose, capillary     Status: Abnormal   Collection Time: 03/26/17  7:59 PM  Result Value Ref Range   Glucose-Capillary 233 (H) 65 - 99 mg/dL   Recent Results (from the past 240 hour(s))  Gram stain     Status: None   Collection Time: 03/24/17  4:23 PM  Result Value Ref Range Status   Specimen Description FLUID PLEURAL RIGHT  Final   Special Requests NONE  Final   Gram Stain   Final    WBC PRESENT, PREDOMINANTLY MONONUCLEAR NO ORGANISMS SEEN CYTOSPIN SMEAR    Report Status 03/24/2017 FINAL  Final  Acid Fast Smear (AFB)     Status: None   Collection Time: 03/24/17  4:23 PM  Result Value Ref Range Status   AFB Specimen Processing Concentration  Final   Acid Fast Smear Negative  Final    Comment: (NOTE) Performed At: [facility] [address] [place], [place] [id number] Lindon Romp MD QJ:[id number]    Source (AFB) FLUID  Final    Comment: PLEURAL RIGHT   Culture, body fluid-bottle     Status: None (Preliminary result)   Collection Time: 03/24/17  4:23 PM  Result Value Ref Range Status   Specimen Description FLUID PLEURAL RIGHT  Final   Special Requests BOTTLES DRAWN AEROBIC AND ANAEROBIC  Final   Culture NO GROWTH 2 DAYS  Final   Report Status PENDING  Incomplete   Creatinine:  Recent Labs  03/21/17 0444 03/23/17 0546 03/24/17 0434 03/25/17 0433 03/26/17 0424 03/26/17 0953 03/26/17 1500   CREATININE 3.00* 2.62* 2.73* 2.77* 3.26* 3.62* 3.61*    Imaging personally reviewed: CT from  03/26/17: IMPRESSION: Stable right retroperitoneal hematoma is noted which extends along the right psoas muscle into the right pelvis and slightly in the right inguinal region. There is now noted moderate right hydronephrosis, most likely due to obstruction of distal ureter secondary to hematoma.  Severe left renal atrophy is noted.  Aortic atherosclerosis.  Dilated gallbladder is noted without definite surrounding Inflammation.  Impression/Assessment:  Acute on chronic renal insufficiency stage IV with right-sided hydronephrosis and a left atrophic kidney  Plan:  I discussed this patient with Dr. Sherral Hammers. this patient is critically ill with multiple medical problems being addressed. It is felt that she would be very high risk for general anesthesia, which would likely be required for a ureteral stent placement. Therefore the next best option would be a right percutaneous nephrostomy tube. Based on the CT scan, it appears that she has minimal function in her left kidney. In order to preserve renal function the right kidney needs to be decompressed. Her hydronephrosis will likely resolve after the retroperitoneal blood collection resolves but this may take several weeks to months.agree with continuing Foley catheter for now.  Marton Redwood, III 03/26/2017, 10:01 PM

## 2017-03-26 NOTE — Progress Notes (Signed)
CRITICAL VALUE ALERT  Critical Value: WBC 51.4  Date & Time Notied:  03/26/17 0930  Provider Notified: Dr. Sherral Hammers  Orders Received/Actions taken: additional labs

## 2017-03-26 NOTE — Progress Notes (Signed)
Physical Therapy Treatment Patient Details Name: Jocelyn Hill. Jocelyn Hill MRN: [id number] DOB: 1950-11-12 Today's Date: 03/26/2017    History of Present Illness Pt adm with Acute Respiratory failure with Hypoxia due to pulm edema and B Pleural effusions and Acute Diastolic CHF. Now with new retroperitoneal bleed. PMH - ckd, dm, depression, cva    PT Comments    Patient progressing slowly towards PT goals. Tolerated standing bouts x2 with Mod A of 2 using back of recliner chair for support. Requires assist for hip extension and upright posture as well as support at bil knees due to buckling unexpectedly. Pt seems distracted today and requires repetition to answer questions or follow commands. Moaning and groaning during session and restless but only able to state she had back discomfort. Continue to recommend SNF. Will follow.   Follow Up Recommendations  SNF     Equipment Recommendations  Rolling walker with 5" wheels    Recommendations for Other Services       Precautions / Restrictions Precautions Precautions: Fall Restrictions Weight Bearing Restrictions: No    Mobility  Bed Mobility Overal bed mobility: Needs Assistance Bed Mobility: Rolling;Sidelying to Sit;Sit to Sidelying Rolling: Min assist Sidelying to sit: Mod assist;HOB elevated     Sit to sidelying: +2 for physical assistance;Min assist General bed mobility comments: Assist elevating trunk into sitting and scooting to EOB. Pt pulling up on therapist to get to EOB. Assist to return to supine.  Transfers Overall transfer level: Needs assistance Equipment used: 2 person hand held assist Transfers: Sit to/from Stand Sit to Stand: Mod assist;+2 physical assistance         General transfer comment: Assist to power to standing with pt pulling up on back of recliner, performed x2. Pt with bil knee buckling. No dizziness. Cues for upright posture.   Ambulation/Gait                 Stairs             Wheelchair Mobility    Modified Rankin (Stroke Patients Only)       Balance Overall balance assessment: Needs assistance Sitting-balance support: Feet supported;Bilateral upper extremity supported Sitting balance-Leahy Scale: Poor Sitting balance - Comments: Requires UE support holding onto therapist or back of recliner; rocking back and forth and collapsing on therapist's belly or on back of chair for support.   Standing balance support: During functional activity;Bilateral upper extremity supported Standing balance-Leahy Scale: Poor Standing balance comment: Reliant on BUE supported on chair and Mod A for support due ot bil knees buckling. Able to stand for a few mins x2.                             Cognition Arousal/Alertness: Awake/alert Behavior During Therapy: Restless Overall Cognitive Status: No family/caregiver present to determine baseline cognitive functioning                                 General Comments: Slow to respond to questions, requires increased time and repetition. Seems very distracted with not sure what. Moaning throughout session and breathing heavily.       Exercises      General Comments General comments (skin integrity, edema, etc.): VSS throughout      Pertinent Vitals/Pain Pain Assessment: Faces Faces Pain Scale: Hurts even more Pain Location: back Pain Descriptors / Indicators: Grimacing;Guarding;Moaning Pain Intervention(s): Monitored during  session;Repositioned;Limited activity within patient's tolerance    Home Living                      Prior Function            PT Goals (current goals can now be found in the care plan section) Progress towards PT goals: Progressing toward goals (slowly)    Frequency    Min 3X/week      PT Plan Current plan remains appropriate    Co-evaluation              AM-PAC PT "6 Clicks" Daily Activity  Outcome Measure  Difficulty turning over in  bed (including adjusting bedclothes, sheets and blankets)?: Unable Difficulty moving from lying on back to sitting on the side of the bed? : Unable Difficulty sitting down on and standing up from a chair with arms (e.g., wheelchair, bedside commode, etc,.)?: Unable Help needed moving to and from a bed to chair (including a wheelchair)?: Total Help needed walking in hospital room?: Total Help needed climbing 3-5 steps with a railing? : Total 6 Click Score: 6    End of Session Equipment Utilized During Treatment: Gait belt Activity Tolerance: Patient tolerated treatment well Patient left: in bed;with call bell/phone within reach;with bed alarm set Nurse Communication: Mobility status PT Visit Diagnosis: Other abnormalities of gait and mobility (R26.89);Muscle weakness (generalized) (M62.81);Unsteadiness on feet (R26.81)     Time: 5726-2035 PT Time Calculation (min) (ACUTE ONLY): 23 min  Charges:  $Therapeutic Activity: 23-37 mins                    G Codes:       Wray Kearns, PT, DPT [phone]     Marguarite Arbour A Mikaiya Tramble 03/26/2017, 11:37 AM

## 2017-03-26 NOTE — Progress Notes (Addendum)
Advanced Heart Failure Rounding Note  Primary Cardiologist: Dr. Bettina Gavia Primary HF: New (Dr. Aundra Dubin)  Subjective:    9/25 CT abd with R psoas retroperitoneal bleed. Heparin stopped.   9/25/ S/P R thoracentesis --> 0.35 liters removed.   Yesterday lasix stopped. WBC up to 49. Creatinine/BUN trending up.   Complaining of abdominal pain.      Objective:   Weight Range: 130 lb 1.1 oz (59 kg) Body mass index is 19.21 kg/m.   Vital Signs:   Temp:  [97.5 F (36.4 C)-99.2 F (37.3 C)] 99 F (37.2 C) (09/27 0305) Pulse Rate:  [68-99] 85 (09/27 0305) Resp:  [17-26] 23 (09/27 0305) BP: (140-178)/(70-96) 162/70 (09/27 0626) SpO2:  [95 %-100 %] 95 % (09/26 2300) Weight:  [130 lb 1.1 oz (59 kg)] 130 lb 1.1 oz (59 kg) (09/27 0500) Last BM Date: 03/22/17  Weight change: Filed Weights   03/24/17 0429 03/25/17 0438 03/26/17 0500  Weight: 141 lb 12.1 oz (64.3 kg) 143 lb 4.8 oz (65 kg) 130 lb 1.1 oz (59 kg)    Intake/Output:   Intake/Output Summary (Last 24 hours) at 03/26/17 0750 Last data filed at 03/26/17 0700  Gross per 24 hour  Intake          1045.89 ml  Output             1850 ml  Net          -804.11 ml      Physical Exam  CVP 1 General:  Moaning. No resp difficulty HEENT: normal Neck: supple. no JVD. Carotids 2+ bilat; no bruits. No lymphadenopathy or thryomegaly appreciated. RIJ Cor: PMI nondisplaced. Regular rate & rhythm. No rubs, gallops or murmurs. Lungs: clear Abdomen: soft,tender, nondistended. No hepatosplenomegaly. No bruits or masses. Good bowel sounds. Extremities: no cyanosis, clubbing, rash, edema Neuro: alert & orientedx3, cranial nerves grossly intact. moves all 4 extremities w/o difficulty. Affect flat    Telemetry  Appears to be junctional rhythm in 70s, personally reviewed.   EKG    Aflutter with RVR 03/21/17.  Labs    CBC  Recent Labs  03/25/17 0433  03/25/17 1800 03/26/17 0424  WBC 16.2*  --   --  49.8*  HGB 9.5*  < > 9.2*  8.5*  HCT 28.2*  < > 27.0* 24.4*  MCV 75.8*  --   --  76.5*  PLT 229  --   --  191  < > = values in this interval not displayed. Basic Metabolic Panel  Recent Labs  03/24/17 0434 03/24/17 1749 03/25/17 0433 03/26/17 0424  NA 135  --  134* 131*  K 3.2* 4.1 4.8 5.8*  CL 93*  --  93* 93*  CO2 31  --  31 26  GLUCOSE 138*  --  139* 202*  BUN 30*  --  28* 30*  CREATININE 2.73*  --  2.77* 3.26*  CALCIUM 9.0  --  9.5 9.4  MG 1.9 1.8  --   --    Liver Function Tests  Recent Labs  03/25/17 0433  AST 46*  ALT 193*  ALKPHOS 120  BILITOT 1.1  PROT 5.6*  ALBUMIN 3.1*   No results for input(s): LIPASE, AMYLASE in the last 72 hours. Cardiac Enzymes No results for input(s): CKTOTAL, CKMB, CKMBINDEX, TROPONINI in the last 72 hours.  BNP: BNP (last 3 results)  Recent Labs  03/12/17 1400  BNP 2,230.5*    ProBNP (last 3 results) No results for input(s): PROBNP  in the last 8760 hours.   D-Dimer No results for input(s): DDIMER in the last 72 hours. Hemoglobin A1C No results for input(s): HGBA1C in the last 72 hours. Fasting Lipid Panel No results for input(s): CHOL, HDL, LDLCALC, TRIG, CHOLHDL, LDLDIRECT in the last 72 hours. Thyroid Function Tests No results for input(s): TSH, T4TOTAL, T3FREE, THYROIDAB in the last 72 hours.  Invalid input(s): FREET3  Other results:   Imaging    No results found.   Medications:     Scheduled Medications: . amiodarone  200 mg Oral BID  . amLODipine  10 mg Oral Daily  . aspirin  81 mg Per Tube Daily  . docusate sodium  100 mg Oral BID  . hydrALAZINE  75 mg Oral Q8H  . insulin aspart  0-15 Units Subcutaneous TID WC  . levothyroxine  112 mcg Per Tube QAC breakfast  . mouth rinse  15 mL Mouth Rinse BID  . pantoprazole  40 mg Oral Daily  . sodium chloride flush  10-40 mL Intracatheter Q12H  . vitamin C  250 mg Oral BID    Infusions: . ferric gluconate (FERRLECIT/NULECIT) IV Stopped (03/25/17 1056)    PRN  Medications: acetaminophen **OR** acetaminophen, bisacodyl, hydrALAZINE, HYDROmorphone (DILAUDID) injection, levalbuterol, LORazepam, ondansetron (ZOFRAN) IV, sennosides, sodium chloride flush    Patient Profile    Jocelyn Hill is a 66 y.o. female with h/o MR, Diastolic CHF, Afib, CAD s/p MI, long standing HTN, DM2, CKD IV-V, h/o Stroke. Anxiety, depression and GERD.   Pt admitted 03/12/17 with acute hypoxemic respiratory failure and CHF. HF team consulted to optimized prior to MVR consideration.   Assessment/Plan   1. 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  - Dr. Roxy Manns recommends treatment of CHF, Afib, ARF with trialysis cath placement, cardiac cath (L/R), and dental service consultation prior to surgery consideration.  2. Acute on chronic diastolic CHF - CVP 1. Off diuretics.   3. PAF/Flutter - Converted to NSR on amio 03/23/17. Maintaining NSR.  - Off heparin with hematoma.  - Continue SCDs for DVT prophylaxis.  - This patients CHA2DS2-VASc Score is at least 8 CHF, HTN, Age, DM, Stroke, and female).  4. CAD s/p MI x 2. - Pt states last MI was 2012-2013. She has not had ischemic work up since then to her recollection.  - Trop peak to 0.11. Likely from demand ischemia from CHF, but needs cath once stabilized prior to MVR consideration. No change.  5. Bradycardia -Resolved.   - s/p temp pacemaker 6. ARF - Nephrology following.  creatinine/BUN rising.  7. HTN- Elevated - Increase hydralazine 75 mg tid.  Contine norvasc 10 mg daily. 8. Metabolic encephalopathy  Thought to be related to ARF.  9. R pleural effusion - S/ P R thoracentesis--> -0.35 liters.  10. Hypokalemia -resolved.  11. Retroperitoneal Hematoma- Had CT of abdomen 9/25 with R Psoas muscle hematoma. Hgb trending down 10>9.5>8.5. Off anticoagulants. 12. ID- WBC trending up. 10>16>49. Repeat CBC now. Check blood and urine cultures.  13. Hyperkalemia- K 5.8 .  Nephrology addressing.  14. Hyponatremia- sodium 131. Watch closley.  Length of Stay: [place], NP  03/26/2017, 7:50 AM  Advanced Heart Failure Team Pager [phone] (M-F; 7a - 4p)  Please contact [facility] for night-coverage after hours (4p -7a ) and weekends on [email or web address]  Patient seen with NP, agree with the above note.  1. 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 She will eventually  need mitral valve intervention, likely replacement. Will need to stabilize her as much as possible medically prior to this. She will need left/right heart cath pre-surgery when creatinine is as stabilized as we can get it. She is not very mobile at all as of yet, need to see some improvement in this prior to surgery. Also, has now had RP hematoma.   2. Acute on chronic diastolic CHF: Normal EF on echo with severe MR. CVP down to 1 today.  Creatinine up to 3.26.  She is now off Lasix.  3. Atrial fibrillation: Paroxysmal, in setting of rheumatic mitral valve disease. CHADSVASC = 8 including history of CVA. She was on heparin gtt but stopped due to RP hematoma.  She appears to be in a junctional rhythm in the 70s today.   - Continue po amiodarone - Need ECG to confirm rhythm.  - Will have to hold anticoagulation for the time being.  4. Right pleural effusion: Thoracentesis done, transudate by pleural fluid/serum protein ratio. Pleural fluid cultures negative so far.  5. AKI on CKD stage 3: ?Baseline creatinine around 2.1. Creatinine up to 3.26 today. When creatinine is as optimized as possible, she will need right/left heart cath with the understanding that renal function could worsen again with the contrast. This is not urgent as she will need considerable rehab prior to surgery.  - Veltassa to treat high K today.  6. HTN: Continue amlodipine, increase hydralazine today as above.  7. CAD: MI in past. Will need coronary angiography prior to MV surgery.  8. Bradycardia: Resolved.  9.  Deconditioning: PT consult, up to chair.  10. RP hematoma: Occurred while on heparin gtt.  Anticoagulation stopped.  Transfuse hgb < 8.  11. ID: WBCs markedly elevated, up to 50K.  No fever. Pleural fluid cultures negative. Main complaint remains abdominal pain.  - Would send blood cultures, UA/culture.  - CXR - Noted plan per nephrology to repeat abdominal CT.   [name] 03/26/2017 10:19 AM

## 2017-03-26 NOTE — Progress Notes (Signed)
CRITICAL VALUE ALERT  Critical Value:  Lactic Acid  Date & Time Notied:  03/26/17 1121  Provider Notified: Dr. Sherral Hammers  Orders Received/Actions taken: additional labs, bolus, abx

## 2017-03-26 NOTE — Progress Notes (Signed)
PROGRESS NOTE    Jocelyn Hill  ZOX:[id number] DOB: 11-24-1950 DOA: 03/12/2017 PCP: [name], MD   Brief Narrative:  66 y.o. BF PMHx Anxiety, Depression, CVA, poorly controlled HTN, CKD stage III, DM type II with Peripheral Neuropathy, Hypothyroidism    Who states that over the past month, she is noted she has become more winded. Then starting on Saturday, 9/8, patient cut the point where she felt like she is having a lot of trouble breathing. The symptoms continued to progress to the point where she could not take it anymore and she came in to the emergency room at [facility] for further evaluation. Patient found to have a BNP of 1700 with a new diagnosis of CHF. Chest x-ray noted moderate bilateral pleural effusions. Patient was given Lasix and put on nitroglycerin patch. Blood pressure systolic in the 811B-147W. Patient initially BiPAP to keep oxygen saturations greater than 90%. In addition, creatinine noted to be 3.2 or baseline was 2.1. Because of worsening creatinine and decompensated CHF, it was felt best to transfer this patient to [facility]. Hospitalists accepted transfer.    Subjective: 9/27 A/O 4, positive abdominal pain (RUQ), positive SOB, positive CP substernal negative radiation started this A.m.     Assessment & Plan:   Principal Problem:   Acute on chronic respiratory failure with hypoxia (HCC) Active Problems:   Hypertension   Hypothyroidism   Diet-controlled diabetes mellitus (HCC)   Acute diastolic (congestive) heart failure (HCC)   AKI (acute kidney injury) (HCC)   CKD (chronic kidney disease), stage III   CHF (congestive heart failure) (HCC)   Dysphagia   Malignant hypertension   Microcytic anemia   Severe mitral regurgitation   CKD (chronic kidney disease), stage IV (HCC)   Substance abuse   Acute diastolic CHF (congestive heart failure) (HCC)   Pulmonary hypertension (HCC)   Asystole (HCC)   Non-rheumatic mitral regurgitation   Acute  renal failure superimposed on stage 3 chronic kidney disease ([place])   Anemia due to chronic kidney disease   Controlled diabetes mellitus type 2 with complications ([place])   Acute encephalopathy   Sepsis unspecified organism -9/27 lactic acidosis: Bolus normal saline 554ml -9/27 patient has been pancultured will start empiric antibiotics  Acute Respiratory failure with Hypoxia/Pulmonary Vascular congestion -Currently on 10 L O2 via HFNC -Patient with good air movement/negative wheezing do not believe COPD exacerbation -Xopenex TID.    Substance abuse -UDS positive for benzodiazepine, opiates, marijuana. Unable to differentiate if positive prior to hospitalization for Benzodiazepine/Opiates.   Acute Diastolic CHF -Strict in and out since admission -19.8 L -Monitor CVP. Patient fluid overloaded. 9/25 CVP = 3 mmHg -Daily weight Filed Weights   03/24/17 0429 03/25/17 0438 03/26/17 0500  Weight: 141 lb 12.1 oz (64.3 kg) 143 lb 4.8 oz (65 kg) 130 lb 1.1 oz (59 kg)  -Discontinued all nodal blocking agent -Amlodipine 10 mg  daily -Furosemide 40 mg BID -Hydralazine 50 mg  TID -Hydralazine  PRN -Monitor closely for bradycardia. Evening of 9/20 patient with episodes of bradycardia/asystole pauses see addendum #2  -NCM Roque Cash working on seeing a). If dental services available next week. b). No dental services available next week how to transport patient into town to have teeth evaluated/removed.      Recent Labs Lab 03/19/17 1334 03/19/17 1930 03/20/17 0100  TROPONINI 0.10* 0.11* 0.11*  -elevated troponin most likely secondary to demand ischemia, continue monitor. -Cardiac catheterization pending: When cardiology feels stable from fluid perspective . -  Nephrology: HD not needed at this time   Pulmonary HTN  -See CHF   Bradycardia/Asystole -See CHF  Severe Mitral Valve regurgitation -Per cardiology, requires Dental consult prior to MV replacement. Unfortunately adult dental  services unavailable this week. Have attempted to page Jorja Loa DDS unsuccessfully. -Will require MV replacement once cardiac catheterization complete, and dental issues resolved  Paroxysmal atrial fibrillation/flutter -See CHF -DC heparin drip secondary to retroperitoneal bleed   Acute on CKD stage III   Recent Labs Lab 03/20/17 0418 03/21/17 0444 03/23/17 0546 03/24/17 0434 03/25/17 0433 03/26/17 0424  CREATININE 3.56* 3.00* 2.62* 2.73* 2.77* 3.26*  -creatinine improving  Right Hydronephrosis -Repeat CT scan shows RIGHT hydronephrosis secondary to RIGHT ureteral obstruction secondary to RIGHT Retroperitoneal bleed  -Discussed case with Dr. Lafayette Dragon [facility], awaiting recommendations   Chronic anemia(Mixed anemia)/Acute Blood Loss anemia -No overt sign of bleeding -Anemia panel: Although MCV 76, panel not consistent pure microcytic anemia.   Recent Labs Lab 03/24/17 0434 03/24/17 2329 03/25/17 0433 03/25/17 0850 03/25/17 1800 03/26/17 0424  HGB 9.6* 10.0* 9.5* 9.1* 9.2* 8.5*  -Monitor closely retroperitoneal bleed -Continue Fe 325 mg BID + vitamin C 250 mg BID -H/H q12   Diabetes type 2 controlled with, complication -2/42 Hemoglobin A1c = 6.2 -9/27 Lantus 5 units daily   -Moderate SSI  Hypothyroidism -Synthroid 112 g daily    Acute encephalopathy  -Multifactorial hyperperfusion, bradycardia, depression, chest pain -Resolved Continue to hold sedating medication: Hold Ambien, Ultram, Zanaflex, Lyrica  Hypokalemia/Hyperkalemia -Potassium goal> 4 -9/27 hyperkalemic: Labs were not drawn is instructed awaiting repeat labs   Hypomagnesemia -Magnesium goal> 2   Elevated liver enzyme????  Leukocytosis -Increasing  infection vs stress Induced? -continued mild elevated WBC -9/27 repeat abdominal CT shows right hydronephrosis see results below  Right retroperitoneal hematoma/Abdominal pain -acute RUQ abdominal pain with Alvarado score>4 -9/25  Abdominal CT; showed right retroperitoneal bleed this results below     DVT prophylaxis: SCD Code Status: Full Family Communication: None Disposition Plan: TBD   Consultants:  Cardiology Nephrology [facility]     Procedures/Significant Events:  9/13 Admit 9/13 Echocardiogram: LVEF= 60-65%. -Grade 2 diastolic dysfunction.-Severe MR.-Mildly dilated LA and RA.-, mild to mod TR,. PAP 60 mm Hg. 9/18 TEE: EF 55-60%.-Severe restriction anterior/posterior leaflet MV w/ severe regurgitation  9/17 CXR: Bilateral pleural effusion.-Mild pulmonary interstitial edema.-Cardiomegaly  9/17 Brady/hypotensive/ intubated > transfer to ICU 9/20 patient to have pauses longest 6.7 seconds/bradycardia. All nodal blocking agents and NTG drips discontinued. 3 Amps Atropine administered.  9/25 Abdominal/pelvis CT:-RIGHT retroperitoneal hematoma extending into the right side of pelvis and right groin region. -Hematocrit level isidentified within the asymmetrically enlarged right psoas muscle hematoma. - Appendix is normal. -Gallbladder hydrops. Etiology and clinical significance indeterminate.  -Bilateral pleural effusions with diminished aeration to the lung bases. 9/25 RIGHT Thoracentesis; 0.35 L of pleural fluid 9/27 PCXR: Continue left pleural effusion/LLL atelectasis improved 9/27 CT abdomen pelvis WO contrast:-Stable RIGHT retroperitoneal hematoma.-Moderate RIGHT hydronephrosis most likely secondary to obstruction distal ureter secondary to hematoma -Dilated gallbladder without definite surrounding inflammation     I have personally reviewed and interpreted all radiology studies and my findings are as above.  VENTILATOR SETTINGS: None   Cultures 9/25 RIGHT pleural fluid NGTD 9/27 urine pending 9/27 blood pending      Antimicrobials: Anti-infectives    Start     Stop   03/16/17 1000  azithromycin (ZITHROMAX) tablet 250 mg  Status:  Discontinued     03/16/17 1316  03/14/17 0900  azithromycin (ZITHROMAX) 500 mg in dextrose 5 % 250 mL IVPB  Status:  Discontinued     03/16/17 0804        Devices     LINES / TUBES:      Continuous Infusions: . ferric gluconate (FERRLECIT/NULECIT) IV Stopped (03/25/17 1056)     Objective: Vitals:   03/26/17 0305 03/26/17 0500 03/26/17 0508 03/26/17 0626  BP: (!) 147/70  (!) 178/70 (!) 162/70  Pulse: 85     Resp: (!) 23     Temp: 99 F (37.2 C)     TempSrc: Axillary     SpO2:      Weight:  130 lb 1.1 oz (59 kg)    Height:        Intake/Output Summary (Last 24 hours) at 03/26/17 7322 Last data filed at 03/26/17 0700  Gross per 24 hour  Intake           672.29 ml  Output             1400 ml  Net          -727.71 ml   Filed Weights   03/24/17 0429 03/25/17 0438 03/26/17 0500  Weight: 141 lb 12.1 oz (64.3 kg) 143 lb 4.8 oz (65 kg) 130 lb 1.1 oz (59 kg)    Physical Exam:  General: A/O 4, positive acute respiratory distress (improved) ENT: Extremely poor dentation (chronic broken teeth, peridontal disease) Neck:  Negative scars, masses, torticollis, lymphadenopathy, JVD Lungs: Clear to auscultation bilaterally upper lobes, decreased absent breath sounds by basilar, negative expiratory wheeze or crackles  Cardiovascular: Regular rate and rhythm without murmur gallop or rub normal S1 and S2, C/W substernal chest pain (onset this A.m.) not reproducible Abdomen: Positive generalized abdominal pain>>> RLQ-periumbilical, nondistended, positive soft, bowel sounds, no rebound, no ascites, no appreciable mass Extremities: No significant cyanosis, clubbing, or edema bilateral lower extremities Skin: Negative rashes, lesions, ulcers Psychiatric:  Negative depression, negative anxiety, negative fatigue, negative mania  Central nervous system:  Cranial nerves II through XII intact, tongue/uvula midline, all extremities muscle strength 5/5, sensation intact throughout, , negative dysarthria, negative  expressive aphasia, negative receptive aphasia.    Data Reviewed: Care during the described time interval was provided by me .  I have reviewed this patient's available data, including medical history, events of note, physical examination, and all test results as part of my evaluation.   CBC:  Recent Labs Lab 03/21/17 0444 03/22/17 0914 03/24/17 0434 03/24/17 2329 03/25/17 0433 03/25/17 0850 03/25/17 1800 03/26/17 0424  WBC 12.1* 10.6* 11.4*  --  16.2*  --   --  49.8*  NEUTROABS  --  8.8*  --   --   --   --   --   --   HGB 9.5* 9.5* 9.6* 10.0* 9.5* 9.1* 9.2* 8.5*  HCT 28.3* 28.7* 28.4* 28.5* 28.2* 27.3* 27.0* 24.4*  MCV 75.3* 75.7* 75.5*  --  75.8*  --   --  76.5*  PLT 243 222 211  --  229  --   --  025   Basic Metabolic Panel:  Recent Labs Lab 03/20/17 0418 03/21/17 0444 03/21/17 1345 03/22/17 0914 03/23/17 0546 03/24/17 0434 03/24/17 1749 03/25/17 0433 03/26/17 0424  NA 140 136  --   --  136 135  --  134* 131*  K 3.8 3.4* 3.7  --  3.2* 3.2* 4.1 4.8 5.8*  CL 107 103  --   --  97* 93*  --  93* 93*  CO2 20* 22  --   --  31 31  --  31 26  GLUCOSE 133* 165*  --   --  146* 138*  --  139* 202*  BUN 55* 51*  --   --  34* 30*  --  28* 30*  CREATININE 3.56* 3.00*  --   --  2.62* 2.73*  --  2.77* 3.26*  CALCIUM 9.6 9.1  --   --  9.2 9.0  --  9.5 9.4  MG  --   --  2.0 1.9 1.7 1.9 1.8  --   --   PHOS 5.1* 3.4  --   --   --   --   --   --   --    GFR: Estimated Creatinine Clearance: 16 mL/min (A) (by C-G formula based on SCr of 3.26 mg/dL (H)). Liver Function Tests:  Recent Labs Lab 03/20/17 0418 03/21/17 0444 03/25/17 0433  AST  --  162* 46*  ALT  --  677* 193*  ALKPHOS  --  85 120  BILITOT  --  1.3* 1.1  PROT  --  6.0* 5.6*  ALBUMIN 3.2* 3.2* 3.1*   No results for input(s): LIPASE, AMYLASE in the last 168 hours. No results for input(s): AMMONIA in the last 168 hours. Coagulation Profile:  Recent Labs Lab 03/24/17 0753  INR 1.16   Cardiac  Enzymes:  Recent Labs Lab 03/19/17 1334 03/19/17 1930 03/20/17 0100  TROPONINI 0.10* 0.11* 0.11*   BNP (last 3 results) No results for input(s): PROBNP in the last 8760 hours. HbA1C: No results for input(s): HGBA1C in the last 72 hours. CBG:  Recent Labs Lab 03/25/17 0742 03/25/17 1124 03/25/17 1602 03/25/17 2145 03/26/17 0846  GLUCAP 157* 135* 127* 191* 223*   Lipid Profile: No results for input(s): CHOL, HDL, LDLCALC, TRIG, CHOLHDL, LDLDIRECT in the last 72 hours. Thyroid Function Tests: No results for input(s): TSH, T4TOTAL, FREET4, T3FREE, THYROIDAB in the last 72 hours. Anemia Panel: No results for input(s): VITAMINB12, FOLATE, FERRITIN, TIBC, IRON, RETICCTPCT in the last 72 hours. Urine analysis:    Component Value Date/Time   COLORURINE YELLOW 03/12/2017 [place] 03/12/2017 1349   LABSPEC 1.008 03/12/2017 1349   PHURINE 6.0 03/12/2017 1349   GLUCOSEU NEGATIVE 03/12/2017 1349   HGBUR NEGATIVE 03/12/2017 1349   BILIRUBINUR NEGATIVE 03/12/2017 1349   KETONESUR NEGATIVE 03/12/2017 1349   PROTEINUR 100 (A) 03/12/2017 1349   NITRITE NEGATIVE 03/12/2017 1349   LEUKOCYTESUR TRACE (A) 03/12/2017 1349   Sepsis Labs: @LABRCNTIP (procalcitonin:4,lacticidven:4)  ) Recent Results (from the past 240 hour(s))  Gram stain     Status: None   Collection Time: 03/24/17  4:23 PM  Result Value Ref Range Status   Specimen Description FLUID PLEURAL RIGHT  Final   Special Requests NONE  Final   Gram Stain   Final    WBC PRESENT, PREDOMINANTLY MONONUCLEAR NO ORGANISMS SEEN CYTOSPIN SMEAR    Report Status 03/24/2017 FINAL  Final  Acid Fast Smear (AFB)     Status: None   Collection Time: 03/24/17  4:23 PM  Result Value Ref Range Status   AFB Specimen Processing Concentration  Final   Acid Fast Smear Negative  Final    Comment: (NOTE) Performed At: [facility] [place], [place] [id number] Lindon Romp MD DP:[id number]     Source (AFB) FLUID  Final    Comment: PLEURAL RIGHT   Culture, body fluid-bottle  Status: None (Preliminary result)   Collection Time: 03/24/17  4:23 PM  Result Value Ref Range Status   Specimen Description FLUID PLEURAL RIGHT  Final   Special Requests BOTTLES DRAWN AEROBIC AND ANAEROBIC  Final   Culture NO GROWTH < 24 HOURS  Final   Report Status PENDING  Incomplete         Radiology Studies: Ct Abdomen Pelvis Wo Contrast  Result Date: 03/24/2017 CLINICAL DATA:  Abdominal pain.  Evaluate for appendicitis EXAM: CT ABDOMEN AND PELVIS WITHOUT CONTRAST TECHNIQUE: Multidetector CT imaging of the abdomen and pelvis was performed following the standard protocol without IV contrast. COMPARISON:  None. FINDINGS: Lower chest: There are bilateral pleural effusions noted left greater than right. The airspace densities within the left lower lobe and lingula noted. There is compressive type atelectasis in the right base. Hepatobiliary: No focal liver abnormality. Gallbladder appears distended. No biliary dilatation. No gallstones identified. Pancreas: Within the limitations of unenhanced technique the pancreas appears unremarkable. Spleen: Normal in size without focal abnormality. Adrenals/Urinary Tract: The right kidney appears normal. Asymmetric atrophy of the left kidney. Normal adrenal glands. The urinary bladder is partially collapsed around a Foley catheter balloon. Stomach/Bowel: The stomach appears normal. The small bowel loops are normal in course and caliber. Normal appearance of the colon. There is a small caliber contrast filled structure within the right lower quadrant which is favored to represent a normal appendix, image 73 of series 3. No pathologic dilatation of the colon. Vascular/Lymphatic: Aortic atherosclerosis. No aneurysm. No adenopathy within the abdomen or pelvis. Reproductive: Uterus is not well visualized and may be atrophic or surgically absent. Other: There is asymmetric, high  attenuation enlargement of the right ileo psoas muscle extending into the right pelvic sidewall and right inguinal region compatible with retroperitoneal and extraperitoneal hematoma of the abdomen and pelvis. Hematocrit arrival is identified within the right psoas muscle hematoma, image number 74 of series 3. Hematoma including the psoas muscle measures 6.5 x 5.2 x 14.8 cm. Musculoskeletal: Mild degenerative disc disease identified within the lumbar spine. IMPRESSION: 1. A right retroperitoneal hematoma is suspected extending into the right side of pelvis and right groin region. A hematocrit level is identified within the asymmetrically enlarged right psoas muscle hematoma. 2. The appendix is normal. 3.  Aortic Atherosclerosis (ICD10-I70.0). 4. Gallbladder hydrops. Etiology and clinical significance indeterminate. This could be better assessed with right upper quadrant sonogram. 5. Bilateral pleural effusions with diminished aeration to the lung bases. 6. Critical Value/emergent results were called by telephone at the time of interpretation on 03/24/2017 at 5:59 pm to Dr. Dia Crawford , who verbally acknowledged these results. Electronically Signed   By: Kerby Moors M.D.   On: 03/24/2017 17:59   Dg Chest 1 View  Result Date: 03/24/2017 CLINICAL DATA:  Post thoracentesis EXAM: CHEST 1 VIEW COMPARISON:  03/23/2017, 03/22/2017 FINDINGS: Right-sided catheter sheath remains in place with kink at the supraclavicular region. The tip projects over the venous confluence. Decreased right-sided pleural effusion with improved aeration of the right lung base. Persistent small left effusion with dense consolidation at the left lung base. Hazy atelectasis or infiltrate at the right lung base. No pneumothorax is seen. Stable cardiomegaly. IMPRESSION: 1. Decreased right pleural effusion post thoracentesis. No definitive pneumothorax seen 2. Continued left pleural effusion with dense left lower lobe atelectasis or pneumonia.  Hazy atelectasis or infiltrate at the right base 3. Cardiomegaly unchanged Electronically Signed   By: Donavan Foil M.D.   On: 03/24/2017 16:29  Ir Thoracentesis Asp Pleural Space W/img Guide  Result Date: 03/24/2017 INDICATION: History of right-sided pleural effusion noted on x-ray. Request is made for diagnostic and therapeutic thoracentesis. EXAM: ULTRASOUND GUIDED DIAGNOSTIC AND THERAPEUTIC THORACENTESIS MEDICATIONS: 1% lidocaine COMPLICATIONS: None immediate. PROCEDURE: An ultrasound guided thoracentesis was thoroughly discussed with the patient and questions answered. The benefits, risks, alternatives and complications were also discussed. The patient understands and wishes to proceed with the procedure. Written consent was obtained. Ultrasound was performed to localize and mark an adequate pocket of fluid in the right chest. The area was then prepped and draped in the normal sterile fashion. 1% Lidocaine was used for local anesthesia. Under ultrasound guidance a Safe-T-Centesis catheter was introduced. Thoracentesis was performed. The catheter was removed and a dressing applied. FINDINGS: A total of approximately 0.35 L of serous fluid was removed. Samples were sent to the laboratory as requested by the clinical team. IMPRESSION: Successful ultrasound guided right thoracentesis yielding 0.35 L of pleural fluid. Read by: Saverio Danker, PA-C Electronically Signed   By: Aletta Edouard M.D.   On: 03/24/2017 16:38        Scheduled Meds: . amiodarone  200 mg Oral BID  . amLODipine  10 mg Oral Daily  . aspirin  81 mg Per Tube Daily  . docusate sodium  100 mg Oral BID  . hydrALAZINE  75 mg Oral Q8H  . insulin aspart  0-15 Units Subcutaneous TID WC  . levothyroxine  112 mcg Per Tube QAC breakfast  . mouth rinse  15 mL Mouth Rinse BID  . pantoprazole  40 mg Oral Daily  . patiromer  8.4 g Oral Once  . sodium chloride flush  10-40 mL Intracatheter Q12H  . vitamin C  250 mg Oral BID    Continuous Infusions: . ferric gluconate (FERRLECIT/NULECIT) IV Stopped (03/25/17 1056)     LOS: 14 days    Time spent: 40 minutes    Nastassia Bazaldua, Geraldo Docker, MD [facility] Pager [phone]   If 7PM-7AM, please contact night-coverage [email or web address] Password [facility] 03/26/2017, 8:52 AM

## 2017-03-26 NOTE — Progress Notes (Signed)
Pharmacy Antibiotic Note  Jocelyn Hill. Jocelyn Hill is a 66 y.o. female admitted on 03/12/2017 with sepsis  Plan: - zosyn 2.25 gm q8 - vanc 750 q48 - monitor cx, renal fx, vt prn  Height: 5\' 9"  (175.3 cm) Weight: 130 lb 1.1 oz (59 kg) IBW/kg (Calculated) : 66.2  Temp (24hrs), Avg:98.7 F (37.1 C), Min:97.6 F (36.4 C), Max:99.2 F (37.3 C)   Recent Labs Lab 03/22/17 0914 03/23/17 0546 03/24/17 0434 03/25/17 0433 03/26/17 0424 03/26/17 0805 03/26/17 0953  WBC 10.6*  --  11.4* 16.2* 49.8* 51.4*  --   CREATININE  --  2.62* 2.73* 2.77* 3.26*  --  3.62*  LATICACIDVEN  --   --   --   --   --   --  3.7*    Estimated Creatinine Clearance: 14.4 mL/min (A) (by C-G formula based on SCr of 3.62 mg/dL (H)).    Allergies  Allergen Reactions  . Ace Inhibitors Anaphylaxis and Swelling    Levester Fresh, PharmD, BCPS, BCCCP Clinical Pharmacist Clinical phone for 03/26/2017 from 7a-3:30p: [phone] If after 3:30p, please call main pharmacy at: [phone] 03/26/2017 11:39 AM

## 2017-03-27 ENCOUNTER — Encounter (HOSPITAL_COMMUNITY): Payer: Self-pay | Admitting: Interventional Radiology

## 2017-03-27 ENCOUNTER — Inpatient Hospital Stay (HOSPITAL_COMMUNITY): Payer: Medicare Other

## 2017-03-27 HISTORY — PX: IR NEPHROSTOMY PLACEMENT RIGHT: IMG6064

## 2017-03-27 LAB — GLUCOSE, CAPILLARY
Glucose-Capillary: 182 mg/dL — ABNORMAL HIGH (ref 65–99)
Glucose-Capillary: 119 mg/dL — ABNORMAL HIGH (ref 65–99)
Glucose-Capillary: 211 mg/dL — ABNORMAL HIGH (ref 65–99)

## 2017-03-27 LAB — RENAL FUNCTION PANEL
Albumin: 2.5 g/dL — ABNORMAL LOW (ref 3.5–5.0)
Anion gap: 10 (ref 5–15)
BUN: 50 mg/dL — ABNORMAL HIGH (ref 6–20)
CO2: 23 mmol/L (ref 22–32)
Calcium: 8.8 mg/dL — ABNORMAL LOW (ref 8.9–10.3)
Chloride: 97 mmol/L — ABNORMAL LOW (ref 101–111)
Creatinine, Ser: 4.65 mg/dL — ABNORMAL HIGH (ref 0.44–1.00)
GFR calc Af Amer: 10 mL/min — ABNORMAL LOW (ref >60)
GFR calc non Af Amer: 9 mL/min — ABNORMAL LOW (ref >60)
Glucose, Bld: 146 mg/dL — ABNORMAL HIGH (ref 65–99)
Phosphorus: 5.4 mg/dL — ABNORMAL HIGH (ref 2.5–4.6)
Potassium: 5.2 mmol/L — ABNORMAL HIGH (ref 3.5–5.1)
Sodium: 130 mmol/L — ABNORMAL LOW (ref 135–145)

## 2017-03-27 LAB — CBC
HCT: 20.4 % — ABNORMAL LOW (ref 36.0–46.0)
Hemoglobin: 7.1 g/dL — ABNORMAL LOW (ref 12.0–15.0)
MCH: 26.3 pg (ref 26.0–34.0)
MCHC: 34.8 g/dL (ref 30.0–36.0)
MCV: 75.6 fL — ABNORMAL LOW (ref 78.0–100.0)
Platelets: 141 10*3/uL — ABNORMAL LOW (ref 150–400)
RBC: 2.7 MIL/uL — ABNORMAL LOW (ref 3.87–5.11)
RDW: 15.4 % (ref 11.5–15.5)
WBC: 42 10*3/uL — ABNORMAL HIGH (ref 4.0–10.5)

## 2017-03-27 LAB — BASIC METABOLIC PANEL
Anion gap: 13 (ref 5–15)
BUN: 40 mg/dL — ABNORMAL HIGH (ref 6–20)
CO2: 23 mmol/L (ref 22–32)
Calcium: 9.3 mg/dL (ref 8.9–10.3)
Chloride: 97 mmol/L — ABNORMAL LOW (ref 101–111)
Creatinine, Ser: 4.24 mg/dL — ABNORMAL HIGH (ref 0.44–1.00)
GFR calc Af Amer: 12 mL/min — ABNORMAL LOW (ref >60)
GFR calc non Af Amer: 10 mL/min — ABNORMAL LOW (ref >60)
Glucose, Bld: 176 mg/dL — ABNORMAL HIGH (ref 65–99)
Potassium: 6.1 mmol/L — ABNORMAL HIGH (ref 3.5–5.1)
Sodium: 133 mmol/L — ABNORMAL LOW (ref 135–145)

## 2017-03-27 LAB — BLOOD CULTURE ID PANEL (REFLEXED)

## 2017-03-27 LAB — URINE CULTURE

## 2017-03-27 LAB — HEMOGLOBIN AND HEMATOCRIT, BLOOD
HCT: 21.7 % — ABNORMAL LOW (ref 36.0–46.0)
Hemoglobin: 7.4 g/dL — ABNORMAL LOW (ref 12.0–15.0)

## 2017-03-27 LAB — ABO/RH: ABO/RH(D): O POS

## 2017-03-27 LAB — PREPARE RBC (CROSSMATCH)

## 2017-03-27 LAB — CHOLESTEROL, BODY FLUID: Cholesterol, Fluid: 23 mg/dL

## 2017-03-27 MED ORDER — ASPIRIN 81 MG PO CHEW: 81.0000 mg | CHEWABLE_TABLET | Freq: Every day | ORAL | Status: DC

## 2017-03-27 MED ORDER — LEVOTHYROXINE SODIUM 112 MCG PO TABS: 112.0000 ug | ORAL_TABLET | Freq: Every day | ORAL | Status: DC

## 2017-03-27 MED ORDER — DEXTROSE 5 % IV SOLN
2.0000 g | INTRAVENOUS | Status: DC
  Administered 2017-03-27 – 2017-04-03 (×8): 2 g via INTRAVENOUS
  Filled 2017-03-27 (×10): qty 2

## 2017-03-27 MED ORDER — MIDAZOLAM HCL 2 MG/2ML IJ SOLN
INTRAMUSCULAR | Status: DC | PRN
  Administered 2017-03-27 (×2): 2 mg via INTRAVENOUS

## 2017-03-27 MED ORDER — AMLODIPINE BESYLATE 5 MG PO TABS
5.0000 mg | ORAL_TABLET | Freq: Every day | ORAL | Status: DC
  Administered 2017-03-28 – 2017-03-31 (×4): 5 mg via ORAL
  Filled 2017-03-27 (×4): qty 1

## 2017-03-27 MED ORDER — MIDAZOLAM HCL 2 MG/2ML IJ SOLN
INTRAMUSCULAR | Status: AC
  Filled 2017-03-27: qty 2

## 2017-03-27 MED ORDER — SODIUM CHLORIDE 0.9% FLUSH
10.0000 mL | Freq: Three times a day (TID) | INTRAVENOUS | Status: DC
  Administered 2017-03-27: 10 mL via INTRAVENOUS

## 2017-03-27 MED ORDER — FENTANYL CITRATE (PF) 100 MCG/2ML IJ SOLN
12.5000 ug | INTRAMUSCULAR | Status: DC | PRN
  Administered 2017-03-28 (×6): 25 ug via INTRAVENOUS
  Filled 2017-03-27 (×6): qty 2

## 2017-03-27 MED ORDER — LEVOTHYROXINE SODIUM 112 MCG PO TABS
112.0000 ug | ORAL_TABLET | Freq: Every day | ORAL | Status: DC
  Administered 2017-03-28 – 2017-04-04 (×8): 112 ug via ORAL
  Filled 2017-03-27 (×8): qty 1

## 2017-03-27 MED ORDER — SENNOSIDES 8.8 MG/5ML PO SYRP
5.0000 mL | ORAL_SOLUTION | Freq: Two times a day (BID) | ORAL | Status: DC | PRN
  Administered 2017-03-28 – 2017-03-29 (×2): 5 mL via ORAL
  Filled 2017-03-27 (×2): qty 5

## 2017-03-27 MED ORDER — LIDOCAINE HCL 1 % IJ SOLN
INTRAMUSCULAR | Status: AC
  Filled 2017-03-27: qty 20

## 2017-03-27 MED ORDER — ASPIRIN 81 MG PO CHEW
81.0000 mg | CHEWABLE_TABLET | Freq: Every day | ORAL | Status: DC
  Administered 2017-03-28 – 2017-04-04 (×8): 81 mg via ORAL
  Filled 2017-03-27 (×8): qty 1

## 2017-03-27 MED ORDER — LIDOCAINE HCL (PF) 1 % IJ SOLN
INTRAMUSCULAR | Status: DC | PRN
  Administered 2017-03-27: 5 mL via INTRADERMAL

## 2017-03-27 MED ORDER — FENTANYL CITRATE (PF) 100 MCG/2ML IJ SOLN
INTRAMUSCULAR | Status: AC
  Filled 2017-03-27: qty 2

## 2017-03-27 MED ORDER — FENTANYL CITRATE (PF) 100 MCG/2ML IJ SOLN
INTRAMUSCULAR | Status: DC | PRN
  Administered 2017-03-27: 100 ug via INTRAVENOUS
  Administered 2017-03-27 (×2): 50 ug via INTRAVENOUS
  Administered 2017-03-27: 100 ug via INTRAVENOUS

## 2017-03-27 MED ORDER — IOPAMIDOL (ISOVUE-300) INJECTION 61%
INTRAVENOUS | Status: AC
  Administered 2017-03-27: 10 mL
  Filled 2017-03-27: qty 50

## 2017-03-27 MED ORDER — AMIODARONE HCL 100 MG PO TABS
100.0000 mg | ORAL_TABLET | Freq: Every day | ORAL | Status: DC
  Administered 2017-03-28 – 2017-04-01 (×5): 100 mg via ORAL
  Filled 2017-03-27 (×5): qty 1

## 2017-03-27 MED ORDER — CIPROFLOXACIN IN D5W 400 MG/200ML IV SOLN
INTRAVENOUS | Status: AC
  Administered 2017-03-27: 400 mg
  Filled 2017-03-27: qty 200

## 2017-03-27 MED ORDER — LORAZEPAM 2 MG/ML IJ SOLN
1.0000 mg | INTRAMUSCULAR | Status: DC | PRN
  Administered 2017-03-27 – 2017-03-28 (×4): 1 mg via INTRAVENOUS
  Filled 2017-03-27 (×4): qty 1

## 2017-03-27 MED ORDER — SODIUM CHLORIDE 0.9 % IV SOLN
Freq: Once | INTRAVENOUS | Status: AC
  Administered 2017-03-27: 11:00:00 via INTRAVENOUS

## 2017-03-27 MED ORDER — PATIROMER SORBITEX CALCIUM 8.4 G PO PACK
8.4000 g | PACK | Freq: Every day | ORAL | Status: DC
  Administered 2017-03-27 – 2017-03-29 (×2): 8.4 g via ORAL
  Filled 2017-03-27 (×5): qty 4

## 2017-03-27 MED ORDER — PATIROMER SORBITEX CALCIUM 8.4 G PO PACK
8.4000 g | PACK | Freq: Every day | ORAL | Status: DC
  Filled 2017-03-27: qty 4

## 2017-03-27 NOTE — Procedures (Signed)
Interventional Radiology Procedure Note  Procedure: Right percutaneous nephrostomy tube placement  Complications: None  Estimated Blood Loss: < 10 mL  10 Fr PCN placed via interpolar access and formed in renal pelvis.  Attached to gravity bag.  Venetia Night. Kathlene Cote, M.D Pager:  [phone]

## 2017-03-27 NOTE — Plan of Care (Signed)
Problem: Pain Managment: Goal: General experience of comfort will improve Outcome: Progressing Patient states "feel better after tube placement".

## 2017-03-27 NOTE — Progress Notes (Signed)
PHARMACY - PHYSICIAN COMMUNICATION CRITICAL VALUE ALERT - BLOOD CULTURE IDENTIFICATION (BCID)  Results for orders placed or performed during the hospital encounter of 03/12/17  Blood Culture ID Panel (Reflexed) (Collected: 03/26/2017 10:40 AM)  Result Value Ref Range   Enterococcus species NOT DETECTED NOT DETECTED   Listeria monocytogenes NOT DETECTED NOT DETECTED   Staphylococcus species NOT DETECTED NOT DETECTED   Staphylococcus aureus NOT DETECTED NOT DETECTED   Streptococcus species NOT DETECTED NOT DETECTED   Streptococcus agalactiae NOT DETECTED NOT DETECTED   Streptococcus pneumoniae NOT DETECTED NOT DETECTED   Streptococcus pyogenes NOT DETECTED NOT DETECTED   Acinetobacter baumannii NOT DETECTED NOT DETECTED   Enterobacteriaceae species DETECTED (A) NOT DETECTED   Enterobacter cloacae complex NOT DETECTED NOT DETECTED   Escherichia coli DETECTED (A) NOT DETECTED   Klebsiella oxytoca NOT DETECTED NOT DETECTED   Klebsiella pneumoniae NOT DETECTED NOT DETECTED   Proteus species NOT DETECTED NOT DETECTED   Serratia marcescens NOT DETECTED NOT DETECTED   Carbapenem resistance NOT DETECTED NOT DETECTED   Haemophilus influenzae NOT DETECTED NOT DETECTED   Neisseria meningitidis NOT DETECTED NOT DETECTED   Pseudomonas aeruginosa NOT DETECTED NOT DETECTED   Candida albicans NOT DETECTED NOT DETECTED   Candida glabrata NOT DETECTED NOT DETECTED   Candida krusei NOT DETECTED NOT DETECTED   Candida parapsilosis NOT DETECTED NOT DETECTED   Candida tropicalis NOT DETECTED NOT DETECTED    Name of physician (or Provider) Contacted:  Sticky note left for primary MD  Changes to prescribed antibiotics required:  Currently receiving Vancomycin and Zosyn.  Consider narrowing therapy to Rocephin 2 g IV q24h  Caryl Pina 03/27/2017  5:52 AM

## 2017-03-27 NOTE — Progress Notes (Signed)
Urology Inpatient Progress Report  HEART FAILURE severe MR hf  Procedure(s): Temporary Pacemaker  11 Days Post-Op   Intv/Subj: The patient continues to be somnolent. She had a nephrostomy tube placed today on the right.Her creatinine was worse this morning.  Principal Problem:   Acute on chronic respiratory failure with hypoxia (HCC) Active Problems:   Hypertension   Hypothyroidism   Diet-controlled diabetes mellitus (HCC)   Acute diastolic (congestive) heart failure (HCC)   AKI (acute kidney injury) (HCC)   CKD (chronic kidney disease), stage III   CHF (congestive heart failure) (HCC)   Dysphagia   Malignant hypertension   Microcytic anemia   Severe mitral regurgitation   CKD (chronic kidney disease), stage IV (HCC)   Substance abuse   Acute diastolic CHF (congestive heart failure) (HCC)   Pulmonary hypertension (HCC)   Asystole (HCC)   Non-rheumatic mitral regurgitation   Acute renal failure superimposed on stage 3 chronic kidney disease ([place])   Anemia due to chronic kidney disease   Controlled diabetes mellitus type 2 with complications ([place])   Acute encephalopathy  Current Facility-Administered Medications  Medication Dose Route Frequency Provider Last Rate Last Dose  . acetaminophen (TYLENOL) tablet 500 mg  500 mg Oral Q6H PRN Cherene Altes, MD       Or  . acetaminophen (TYLENOL) suppository 325 mg  325 mg Rectal Q6H PRN Cherene Altes, MD      . Derrill Memo ON 03/28/2017] amiodarone (PACERONE) tablet 100 mg  100 mg Oral Daily Larey Dresser, MD      . Derrill Memo ON 03/28/2017] amLODipine (NORVASC) tablet 5 mg  5 mg Oral Daily Larey Dresser, MD      . aspirin chewable tablet 81 mg  81 mg Oral Daily Joette Catching T, MD      . bisacodyl (DULCOLAX) suppository 10 mg  10 mg Rectal Daily PRN Jennelle Human B, NP   10 mg at 03/22/17 1255  . cefTRIAXone (ROCEPHIN) 2 g in dextrose 5 % 50 mL IVPB  2 g Intravenous Q24H Joette Catching T, MD      . docusate sodium  (COLACE) capsule 100 mg  100 mg Oral BID Walden Field A, NP   100 mg at 03/26/17 2014  . fentaNYL (SUBLIMAZE) 100 MCG/2ML injection           . fentaNYL (SUBLIMAZE) injection 12.5-25 mcg  12.5-25 mcg Intravenous Q2H PRN Joette Catching T, MD      . hydrALAZINE (APRESOLINE) injection 10 mg  10 mg Intravenous Q4H PRN Shirley Friar, PA-C   10 mg at 03/26/17 5852  . hydrALAZINE (APRESOLINE) tablet 75 mg  75 mg Oral Q8H Clegg, Amy D, NP   75 mg at 03/26/17 2015  . insulin aspart (novoLOG) injection 0-15 Units  0-15 Units Subcutaneous TID WC Allie Bossier, MD   5 Units at 03/27/17 1715  . insulin glargine (LANTUS) injection 5 Units  5 Units Subcutaneous Daily Allie Bossier, MD   5 Units at 03/27/17 [phone]  . levalbuterol (XOPENEX) nebulizer solution 1.25 mg  1.25 mg Nebulization Q6H PRN Cherene Altes, MD      . levothyroxine (SYNTHROID, LEVOTHROID) tablet 112 mcg  112 mcg Oral QAC breakfast Joette Catching T, MD      . lidocaine (PF) (XYLOCAINE) 1 % injection    PRN Aletta Edouard, MD   5 mL at 03/27/17 1536  . lidocaine (XYLOCAINE) 1 % (with pres) injection           .  LORazepam (ATIVAN) injection 1 mg  1 mg Intravenous Q4H PRN Cherene Altes, MD      . MEDLINE mouth rinse  15 mL Mouth Rinse BID Allie Bossier, MD   15 mL at 03/27/17 1030  . midazolam (VERSED) 2 MG/2ML injection           . midazolam (VERSED) 2 MG/2ML injection           . ondansetron (ZOFRAN) injection 4 mg  4 mg Intravenous Q6H PRN Desai, Rahul P, PA-C   4 mg at 03/26/17 0326  . pantoprazole (PROTONIX) EC tablet 40 mg  40 mg Oral Daily Allie Bossier, MD   40 mg at 03/26/17 0919  . patiromer Daryll Drown) packet 8.4 g  8.4 g Oral Daily Cherene Altes, MD   8.4 g at 03/27/17 1129  . sennosides (SENOKOT) 8.8 MG/5ML syrup 5 mL  5 mL Oral BID PRN Joette Catching T, MD      . sodium chloride flush (NS) 0.9 % injection 10-40 mL  10-40 mL Intracatheter Q12H Hammonds, Sharyn Blitz, MD   10 mL at 03/26/17 2151  .  sodium chloride flush (NS) 0.9 % injection 10-40 mL  10-40 mL Intracatheter PRN Hammonds, Sharyn Blitz, MD      . vitamin C (ASCORBIC ACID) tablet 250 mg  250 mg Oral BID Allie Bossier, MD   250 mg at 03/26/17 2015     Objective: Vital: Vitals:   03/27/17 1540 03/27/17 1545 03/27/17 1550 03/27/17 1622  BP: 139/80 137/64 140/67 (!) 143/63  Pulse: (!) 57 (!) 56 (!) 57 (!) 55  Resp: (!) 23 20 (!) 30 (!) 30  Temp:    98.6 F (37 C)  TempSrc:    Axillary  SpO2: (!) 87% 94% (!) 88% 100%  Weight:      Height:       I/Os: I/O last 3 completed shifts: In: 200 [I.V.:50; IV Piggyback:150] Out: 1740 [Urine:1750]  Physical Exam:  General: Patient is in no apparent distress; minimally conversive Lungs: Normal respiratory effort, chest expands symmetrically. GU: right nephrostomy tube in place draining light red urine Ext: lower extremities symmetric  Lab Results:  Recent Labs  03/26/17 0424 03/26/17 0805 03/27/17 0338  WBC 49.8* 51.4* 42.0*  HGB 8.5* 8.1* 7.1*  HCT 24.4* 23.0* 20.4*    Recent Labs  03/26/17 0953 03/26/17 1500 03/26/17 2147 03/27/17 0338  NA 131* 130*  --  133*  K 5.9* 5.9* 5.7* 6.1*  CL 93* 94*  --  97*  CO2 25 26  --  23  GLUCOSE 144* 165*  --  176*  BUN 33* 36*  --  40*  CREATININE 3.62* 3.61*  --  4.24*  CALCIUM 9.7 9.3  --  9.3    Recent Labs  03/26/17 1132 03/26/17 2147  INR 1.33 1.34   No results for input(s): LABURIN in the last 72 hours. Results for orders placed or performed during the hospital encounter of 03/12/17  MRSA PCR Screening     Status: None   Collection Time: 03/12/17  9:52 AM  Result Value Ref Range Status   MRSA by PCR NEGATIVE NEGATIVE Final    Comment:        The GeneXpert MRSA Assay (FDA approved for NASAL specimens only), is one component of a comprehensive MRSA colonization surveillance program. It is not intended to diagnose MRSA infection nor to guide or monitor treatment for MRSA infections.   MRSA PCR  Screening  Status: None   Collection Time: 03/15/17  8:32 PM  Result Value Ref Range Status   MRSA by PCR NEGATIVE NEGATIVE Final    Comment:        The GeneXpert MRSA Assay (FDA approved for NASAL specimens only), is one component of a comprehensive MRSA colonization surveillance program. It is not intended to diagnose MRSA infection nor to guide or monitor treatment for MRSA infections.   Gram stain     Status: None   Collection Time: 03/24/17  4:23 PM  Result Value Ref Range Status   Specimen Description FLUID PLEURAL RIGHT  Final   Special Requests NONE  Final   Gram Stain   Final    WBC PRESENT, PREDOMINANTLY MONONUCLEAR NO ORGANISMS SEEN CYTOSPIN SMEAR    Report Status 03/24/2017 FINAL  Final  Fungus Culture With Stain     Status: None (Preliminary result)   Collection Time: 03/24/17  4:23 PM  Result Value Ref Range Status   Fungus Stain Final report  Final    Comment: (NOTE) Performed At: [facility] [place], [place] [id number] Lindon Romp MD PP:[id number]    Fungus (Mycology) Culture PENDING  Incomplete   Fungal Source FLUID  Final    Comment: PLEURAL RIGHT   Acid Fast Smear (AFB)     Status: None   Collection Time: 03/24/17  4:23 PM  Result Value Ref Range Status   AFB Specimen Processing Concentration  Final   Acid Fast Smear Negative  Final    Comment: (NOTE) Performed At: [facility] [address] [place], [place] [id number] Lindon Romp MD XN:[id number]    Source (AFB) FLUID  Final    Comment: PLEURAL RIGHT   Culture, body fluid-bottle     Status: None (Preliminary result)   Collection Time: 03/24/17  4:23 PM  Result Value Ref Range Status   Specimen Description FLUID PLEURAL RIGHT  Final   Special Requests BOTTLES DRAWN AEROBIC AND ANAEROBIC  Final   Culture NO GROWTH 3 DAYS  Final   Report Status PENDING  Incomplete  Fungus Culture Result     Status: None   Collection Time: 03/24/17  4:23 PM   Result Value Ref Range Status   Result 1 Comment  Final    Comment: (NOTE) KOH/Calcofluor preparation:  no fungus observed. Performed At: [facility] [place], [place] [id number] Lindon Romp MD SE:[id number]   Urine Culture     Status: Abnormal (Preliminary result)   Collection Time: 03/26/17  8:07 AM  Result Value Ref Range Status   Specimen Description URINE, CATHETERIZED  Final   Special Requests NONE  Final   Culture >=100,000 COLONIES/mL GRAM NEGATIVE RODS (A)  Final   Report Status PENDING  Incomplete  Culture, blood (routine x 2)     Status: None (Preliminary result)   Collection Time: 03/26/17 10:40 AM  Result Value Ref Range Status   Specimen Description BLOOD LEFT HAND  Final   Special Requests IN PEDIATRIC BOTTLE Blood Culture adequate volume  Final   Culture NO GROWTH 1 DAY  Final   Report Status PENDING  Incomplete  Culture, blood (routine x 2)     Status: None (Preliminary result)   Collection Time: 03/26/17 10:40 AM  Result Value Ref Range Status   Specimen Description BLOOD RIGHT HAND  Final   Special Requests IN PEDIATRIC BOTTLE Blood Culture adequate volume  Final   Culture  Setup Time   Final  GRAM NEGATIVE RODS IN PEDIATRIC BOTTLE CRITICAL RESULT CALLED TO, READ BACK BY AND VERIFIED WITH: G.ABBOTT PHARMD 03/27/17 0548 L.CHAMPION    Culture GRAM NEGATIVE RODS  Final   Report Status PENDING  Incomplete  Blood Culture ID Panel (Reflexed)     Status: Abnormal   Collection Time: 03/26/17 10:40 AM  Result Value Ref Range Status   Enterococcus species NOT DETECTED NOT DETECTED Final   Listeria monocytogenes NOT DETECTED NOT DETECTED Final   Staphylococcus species NOT DETECTED NOT DETECTED Final   Staphylococcus aureus NOT DETECTED NOT DETECTED Final   Streptococcus species NOT DETECTED NOT DETECTED Final   Streptococcus agalactiae NOT DETECTED NOT DETECTED Final   Streptococcus pneumoniae NOT DETECTED NOT DETECTED Final    Streptococcus pyogenes NOT DETECTED NOT DETECTED Final   Acinetobacter baumannii NOT DETECTED NOT DETECTED Final   Enterobacteriaceae species DETECTED (A) NOT DETECTED Final    Comment: Enterobacteriaceae represent a large family of gram-negative bacteria, not a single organism. CRITICAL RESULT CALLED TO, READ BACK BY AND VERIFIED WITH: G.ABBOTT PHARMD 03/27/17 0548 L.CHAMPION    Enterobacter cloacae complex NOT DETECTED NOT DETECTED Final   Escherichia coli DETECTED (A) NOT DETECTED Final    Comment: CRITICAL RESULT CALLED TO, READ BACK BY AND VERIFIED WITH: G.ABBOTT PHARMD 03/27/17 0548 L.CHAMPION    Klebsiella oxytoca NOT DETECTED NOT DETECTED Final   Klebsiella pneumoniae NOT DETECTED NOT DETECTED Final   Proteus species NOT DETECTED NOT DETECTED Final   Serratia marcescens NOT DETECTED NOT DETECTED Final   Carbapenem resistance NOT DETECTED NOT DETECTED Final   Haemophilus influenzae NOT DETECTED NOT DETECTED Final   Neisseria meningitidis NOT DETECTED NOT DETECTED Final   Pseudomonas aeruginosa NOT DETECTED NOT DETECTED Final   Candida albicans NOT DETECTED NOT DETECTED Final   Candida glabrata NOT DETECTED NOT DETECTED Final   Candida krusei NOT DETECTED NOT DETECTED Final   Candida parapsilosis NOT DETECTED NOT DETECTED Final   Candida tropicalis NOT DETECTED NOT DETECTED Final  Culture, Urine     Status: Abnormal   Collection Time: 03/26/17  3:38 PM  Result Value Ref Range Status   Specimen Description URINE, RANDOM  Final   Special Requests NONE  Final   Culture MULTIPLE SPECIES PRESENT, SUGGEST RECOLLECTION (A)  Final   Report Status 03/27/2017 FINAL  Final    Studies/Results: Ct Abdomen Pelvis Wo Contrast  Result Date: 03/26/2017 CLINICAL DATA:  Retroperitoneal hematoma. EXAM: CT ABDOMEN AND PELVIS WITHOUT CONTRAST TECHNIQUE: Multidetector CT imaging of the abdomen and pelvis was performed following the standard protocol without IV contrast. COMPARISON:  CT scan of  March 24, 2017. FINDINGS: Lower chest: Mild left posterior basilar atelectasis or infiltrate is noted. Hepatobiliary: Dilated gallbladder is again noted and unchanged. No focal abnormality is seen in the liver on these unenhanced images. Pancreas: Unremarkable. No pancreatic ductal dilatation or surrounding inflammatory changes. Spleen: Normal in size without focal abnormality. Adrenals/Urinary Tract: Adrenal glands appear normal. Severe left renal atrophy is noted. Moderate right hydronephrosis with perinephric stranding is noted, which appears to be due to retroperitoneal hematoma described on prior exam. Foley catheter is present within the urinary bladder. No renal or ureteral calculi are noted. Stomach/Bowel: Stomach is within normal limits. Appendix appears normal. No evidence of bowel wall thickening, distention, or inflammatory changes. Vascular/Lymphatic: Aortic atherosclerosis. No enlarged abdominal or pelvic lymph nodes. Reproductive: Status post hysterectomy. No adnexal masses. Other: Stable right retroperitoneal hematoma is noted which extends along the right psoas muscle and into the right  pelvis and slightly into the right inguinal region. Musculoskeletal: No acute or significant osseous findings. IMPRESSION: Stable right retroperitoneal hematoma is noted which extends along the right psoas muscle into the right pelvis and slightly in the right inguinal region. There is now noted moderate right hydronephrosis, most likely due to obstruction of distal ureter secondary to hematoma. Severe left renal atrophy is noted. Aortic atherosclerosis. Dilated gallbladder is noted without definite surrounding inflammation. Electronically Signed   By: Marijo Conception, M.D.   On: 03/26/2017 15:40   Dg Chest Port 1 View  Result Date: 03/26/2017 CLINICAL DATA:  Pneumonia EXAM: PORTABLE CHEST 1 VIEW COMPARISON:  03/24/2017 FINDINGS: Cardiomegaly. Right lung is clear. Continued left lower lobe airspace opacity  and probable small left effusion. Aeration is slightly improved in the left base. Right internal jugular vascular sheath again noted, unchanged. IMPRESSION: Continued left pleural effusion with left lower lobe atelectasis or infiltrate, both improved since prior study. No confluent opacity on the right. Stable cardiomegaly. Electronically Signed   By: Rolm Baptise M.D.   On: 03/26/2017 10:37   Ir Nephrostomy Placement Right  Result Date: 03/27/2017 INDICATION: Right-sided hydronephrosis secondary to ureteral obstruction. EXAM: IR NEPHROSTOMY PLACEMENT RIGHT COMPARISON:  CT of the abdomen on 03/26/2017 MEDICATIONS: Ciprofloxacin 400 mg IV; The antibiotic was administered in an appropriate time frame prior to skin puncture. ANESTHESIA/SEDATION: Fentanyl 100 mcg IV; Versed 4.0 mg IV Moderate Sedation Time:  10 minutes. The patient was continuously monitored during the procedure by the interventional radiology nurse under my direct supervision. CONTRAST:  10 mL Isovue-300 - administered into the collecting system(s) FLUOROSCOPY TIME:  Fluoroscopy Time: 1 minutes and 30 seconds (4.1 mGy). COMPLICATIONS: None immediate. PROCEDURE: Informed written consent was obtained from the patient after a thorough discussion of the procedural risks, benefits and alternatives. All questions were addressed. Maximal Sterile Barrier Technique was utilized including caps, mask, sterile gowns, sterile gloves, sterile drape, hand hygiene and skin antiseptic. A timeout was performed prior to the initiation of the procedure. Ultrasound was used to localize the right kidney. Under direct ultrasound guidance, a 21 gauge needle was advanced into the collecting system. After aspiration of urine, contrast was injected. A guidewire was advanced into the collecting system. A transitional dilator was placed over the wire. The percutaneous tract was dilated over the wire and a 10 French percutaneous nephrostomy tube advanced. This was formed in the  collecting system and a fluoroscopic spot image obtained after injection of contrast material. The catheter was flushed and connected to a gravity drainage bag. It was secured at the skin with a Prolene retention suture and StatLock device. FINDINGS: By ultrasound and also later with contrast injection, hydronephrosis of the right kidney does appear to be improved. However, given renal failure progressive hyperkalemia, there was felt to be indication to proceed with nephrostomy tube placement. The tube was placed via interpolar access with the catheter formed in the renal pelvis. IMPRESSION: Right-sided percutaneous nephrostomy tube placement with placement of a 10 French catheter formed at the level of the renal pelvis. The catheter was attached to gravity bag drainage. Electronically Signed   By: Aletta Edouard M.D.   On: 03/27/2017 16:21    Assessment: Right retroperitoneal bleed with right hydronephrosis and acute on chronic renal insufficiency stage IV   Plan: Recommend continuing the nephrostomy tube. Would recommend repeating a CT scan of the abdomen and pelvis in 6 weeks(around 11/12) to determine if there is resolution of the retroperitoneal blood causing mass effect.no  further urological intervention at this time.   Link Snuffer, MD Urology 03/27/2017, 5:59 PM

## 2017-03-27 NOTE — Progress Notes (Signed)
Advanced Heart Failure Rounding Note  Primary Cardiologist: Dr. Bettina Gavia Primary HF: New (Dr. Aundra Dubin)  Subjective:    9/25 CT abd with R psoas retroperitoneal bleed. Heparin stopped.   9/25/ S/P R thoracentesis --> 0.35 liters removed.   Diuretics stopped 03/25/17. WBC up to 42. Hgb 7.1 this am.  BCx + for gram negative rods -> E. Coli bacteremia.  Confused this am. Does not follow commands. Grunts and moans for responses. Sitting up in bed, alert to person only.   K 6.1 this am. Creatinine 4.24.  Got Veltessa yesterday.    Objective:   Weight Range: 134 lb 0.6 oz (60.8 kg) Body mass index is 19.79 kg/m.   Vital Signs:   Temp:  [97.6 F (36.4 C)-98.7 F (37.1 C)] 97.6 F (36.4 C) (09/28 0314) Pulse Rate:  [53-115] 59 (09/28 0314) Resp:  [13-24] 13 (09/28 0718) BP: (125-157)/(43-126) 125/43 (09/28 0718) SpO2:  [94 %-97 %] 94 % (09/28 0314) Weight:  [134 lb 0.6 oz (60.8 kg)] 134 lb 0.6 oz (60.8 kg) (09/28 0314) Last BM Date: 03/22/17  Weight change: Filed Weights   03/25/17 0438 03/26/17 0500 03/27/17 0314  Weight: 143 lb 4.8 oz (65 kg) 130 lb 1.1 oz (59 kg) 134 lb 0.6 oz (60.8 kg)    Intake/Output:   Intake/Output Summary (Last 24 hours) at 03/27/17 0910 Last data filed at 03/27/17 0607  Gross per 24 hour  Intake              180 ml  Output              600 ml  Net             -420 ml      Physical Exam   CVP 7-8 sitting up in bed, pt would not sit still or lie back.   General: Elderly and chronically ill appearing. Uncomfortable.  HEENT: Normal Neck: Supple. JVP ~7-8 cm. Carotids 2+ bilat; no bruits. No thyromegaly or nodule noted. Cor: PMI nondisplaced. RRR, No M/G/R noted Lungs: Slightly diminished basilar sounds.  Abdomen: Soft, non-tender, non-distended, no HSM. No bruits or masses. +BS  Extremities: No cyanosis, clubbing, or rash. Trace ankle edema.  Neuro: Alert to PERSON. Does not follow commands. Moves all 4 extremities w/o apparent  difficulty.   Telemetry   Appears to be junctional in 50s-60s, Personally reviewed.   EKG    Aflutter with RVR 03/21/17.  Labs    CBC  Recent Labs  03/26/17 0805 03/27/17 0338  WBC 51.4* 42.0*  HGB 8.1* 7.1*  HCT 23.0* 20.4*  MCV 75.9* 75.6*  PLT 170 829*   Basic Metabolic Panel  Recent Labs  03/26/17 1500 03/26/17 2147 03/27/17 0338  NA 130*  --  133*  K 5.9* 5.7* 6.1*  CL 94*  --  97*  CO2 26  --  23  GLUCOSE 165*  --  176*  BUN 36*  --  40*  CREATININE 3.61*  --  4.24*  CALCIUM 9.3  --  9.3  MG 1.8 1.6*  --    Liver Function Tests  Recent Labs  03/25/17 0433 03/26/17 0953  AST 46* 39  ALT 193* 121*  ALKPHOS 120 109  BILITOT 1.1 1.0  PROT 5.6* 5.8*  ALBUMIN 3.1* 2.7*   No results for input(s): LIPASE, AMYLASE in the last 72 hours. Cardiac Enzymes No results for input(s): CKTOTAL, CKMB, CKMBINDEX, TROPONINI in the last 72 hours.  BNP: BNP (last 3 results)  Recent Labs  03/12/17 1400  BNP 2,230.5*    ProBNP (last 3 results) No results for input(s): PROBNP in the last 8760 hours.   D-Dimer No results for input(s): DDIMER in the last 72 hours. Hemoglobin A1C No results for input(s): HGBA1C in the last 72 hours. Fasting Lipid Panel No results for input(s): CHOL, HDL, LDLCALC, TRIG, CHOLHDL, LDLDIRECT in the last 72 hours. Thyroid Function Tests No results for input(s): TSH, T4TOTAL, T3FREE, THYROIDAB in the last 72 hours.  Invalid input(s): FREET3  Other results:   Imaging    Ct Abdomen Pelvis Wo Contrast  Result Date: 03/26/2017 CLINICAL DATA:  Retroperitoneal hematoma. EXAM: CT ABDOMEN AND PELVIS WITHOUT CONTRAST TECHNIQUE: Multidetector CT imaging of the abdomen and pelvis was performed following the standard protocol without IV contrast. COMPARISON:  CT scan of March 24, 2017. FINDINGS: Lower chest: Mild left posterior basilar atelectasis or infiltrate is noted. Hepatobiliary: Dilated gallbladder is again noted and  unchanged. No focal abnormality is seen in the liver on these unenhanced images. Pancreas: Unremarkable. No pancreatic ductal dilatation or surrounding inflammatory changes. Spleen: Normal in size without focal abnormality. Adrenals/Urinary Tract: Adrenal glands appear normal. Severe left renal atrophy is noted. Moderate right hydronephrosis with perinephric stranding is noted, which appears to be due to retroperitoneal hematoma described on prior exam. Foley catheter is present within the urinary bladder. No renal or ureteral calculi are noted. Stomach/Bowel: Stomach is within normal limits. Appendix appears normal. No evidence of bowel wall thickening, distention, or inflammatory changes. Vascular/Lymphatic: Aortic atherosclerosis. No enlarged abdominal or pelvic lymph nodes. Reproductive: Status post hysterectomy. No adnexal masses. Other: Stable right retroperitoneal hematoma is noted which extends along the right psoas muscle and into the right pelvis and slightly into the right inguinal region. Musculoskeletal: No acute or significant osseous findings. IMPRESSION: Stable right retroperitoneal hematoma is noted which extends along the right psoas muscle into the right pelvis and slightly in the right inguinal region. There is now noted moderate right hydronephrosis, most likely due to obstruction of distal ureter secondary to hematoma. Severe left renal atrophy is noted. Aortic atherosclerosis. Dilated gallbladder is noted without definite surrounding inflammation. Electronically Signed   By: Marijo Conception, M.D.   On: 03/26/2017 15:40   Dg Chest Port 1 View  Result Date: 03/26/2017 CLINICAL DATA:  Pneumonia EXAM: PORTABLE CHEST 1 VIEW COMPARISON:  03/24/2017 FINDINGS: Cardiomegaly. Right lung is clear. Continued left lower lobe airspace opacity and probable small left effusion. Aeration is slightly improved in the left base. Right internal jugular vascular sheath again noted, unchanged. IMPRESSION:  Continued left pleural effusion with left lower lobe atelectasis or infiltrate, both improved since prior study. No confluent opacity on the right. Stable cardiomegaly. Electronically Signed   By: Rolm Baptise M.D.   On: 03/26/2017 10:37     Medications:     Scheduled Medications: . amiodarone  200 mg Oral BID  . amLODipine  10 mg Oral Daily  . aspirin  81 mg Per Tube Daily  . docusate sodium  100 mg Oral BID  . hydrALAZINE  75 mg Oral Q8H  . insulin aspart  0-15 Units Subcutaneous TID WC  . insulin glargine  5 Units Subcutaneous Daily  . levothyroxine  112 mcg Per Tube QAC breakfast  . mouth rinse  15 mL Mouth Rinse BID  . pantoprazole  40 mg Oral Daily  . patiromer  8.4 g Oral Daily  . sodium chloride flush  10-40 mL Intracatheter Q12H  . vitamin  C  250 mg Oral BID    Infusions: . piperacillin-tazobactam (ZOSYN)  IV Stopped (03/27/17 4944)  . vancomycin Stopped (03/26/17 1332)    PRN Medications: acetaminophen **OR** acetaminophen, bisacodyl, haloperidol lactate, hydrALAZINE, HYDROmorphone (DILAUDID) injection, levalbuterol, LORazepam, ondansetron (ZOFRAN) IV, sennosides, sodium chloride flush    Patient Profile    Jocelyn Hill is a 66 y.o. female with h/o MR, Diastolic CHF, Afib, CAD s/p MI, long standing HTN, DM2, CKD IV-V, h/o Stroke. Anxiety, depression and GERD.   Pt admitted 03/12/17 with acute hypoxemic respiratory failure and CHF. HF team consulted to optimized prior to MVR consideration.   Assessment/Plan   1. 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  - Dr. Roxy Manns recommends treatment of CHF, Afib, ARF with trialysis cath placement, cardiac cath (L/R), and dental service consultation prior to surgery consideration.  - Now too ill to consider at this time.  2. Acute on chronic diastolic CHF - CVP 7-8 cm on my personal check. She is off diuretics with ARF.  3. PAF/Flutter - Converted to NSR on amio 03/23/17.  Maintaining NSR. No change. - Off heparin with hematoma.  - Continue SCDs for DVT prophylaxis.  - This patients CHA2DS2-VASc Score is at least 8 CHF, HTN, Age, DM, Stroke, and female).  4. CAD s/p MI x 2. - Pt states last MI was 2012-2013. She has not had ischemic work up since then to her recollection.  - Trop peak to 0.11. Likely from demand ischemia from CHF, but needs cath once stabilized prior to MVR consideration.  - No change. 5. Bradycardia -Resolved.   - s/p temp pacemaker 6. ARF - Nephrology following.  Creatinine continues to rise.  7. HTN - Continue hydralazine 75 mg tid.  Contine norvasc 10 mg daily. - Follow close for hypotension with sepsis.  8. Metabolic encephalopathy  Thought to be related to ARF. No cahnge.  9. R pleural effusion - S/ P R thoracentesis--> -0.35 liters.  10. Hyperkalemia - In setting of renal failure. Getting Veltessa per renal.  11. Retroperitoneal Hematoma- Had CT of abdomen 9/25 with R Psoas muscle hematoma. Hgb trending down 10>9.5>8.5 > 7.1. She is off anticoagulants.  - Give 1 u PRBCs now.  12. ID- WBC trending up. 10>16>49. Repeat CBC now.  - BCx positive for E. Coli bacteremia. Likely change ABX to rocephin. Continue Zosyn until susceptibilities come back. Discuss with ID pharmacist.  - UA positive  13. Hyponatremia- sodium 133. Watch closely.  14. R Hydronephrosis - Secondary to [facility].  Plan for nephrostomy tube.   Pt is critically ill with E. Coli bacteremia and multiple system organ failure. Prognosis guarded.   Length of Stay: [place], [place]  03/27/2017, 9:10 AM  Advanced Heart Failure Team Pager [phone] (M-F; 7a - 4p)  Please contact [facility] for night-coverage after hours (4p -7a ) and weekends on [email or web address]  Patient seen with PA, agree with the above note.  Confused this morning, in restraints.   E coli bacteremia, urine source.  On abx per primary service.   Hemoglobin 7.1 today, with RP hematoma,  will give 1 unit PRBCs.   CVP 7-8 today, off diuretics.    AKI: Renal function acutely worsened with right hydronephrosis in setting of right-sided RP hematoma and atrophic left kidney.  Will need percutaneous nephrostomy tube per urology.   Junctional rhythm in 50s this morning.  Decrease amiodarone to 100 mg daily, may have to stop  if HR drops any lower.   She is a long way from mitral valve surgery, will need considerable medical stabilization.  We will follow at a distance for now.   [name] 03/27/2017 10:07 AM

## 2017-03-27 NOTE — Progress Notes (Signed)
Jocelyn Hill  KAJ:[id number] DOB: July 30, 1950 DOA: 03/12/2017 PCP: [name], MD    Brief Narrative:  66yo F w/ a hx of anxiety, depression, CVA, poorly controlled HTN, CKD stage III, DM2 with peripheral neuropathy, and Hypothyroidism who presented w/ a month of gradually worsening dyspnea.  She initially presented to the ED at [facility] where she was found to have a BNP of 1700.  CXR noted moderate bilateral pleural effusions. Because of worsening creatinine and decompensated CHF, it was felt best to transfer her to [facility].   Subjective: The patient is sedated at present having just returned from radiology where she had her percutaneous nephrostomy to placed.  She does not appear to be in acute respiratory distress and certainly there is no evidence of uncontrolled pain at this time.  Her nurse has reported that her pain and agitation have responded best to Ativan and that the Dilaudid appears to have very little effect.  Assessment & Plan:  Acute Respiratory failure with Hypoxia due to pulm edema and B Pleural effusions  Steadily improving w/ diuresis - R thoracentesis 9/25 yielded ~350cc of fluid (exudate per LDH, but transudate per protein) - continues to require oxygen support  Large Right retroperitoneal hematoma/Abdominal pain Noted via CT abdom 9/25 - Hgb holding steady - pain better controlled, but must utilize care w/ dilaudid if crt does not soon improve   Acute renal failure on CKD stage III - R obstructive hydronephrosis  Care as per Nephrology - baseline crt ~2.1 - acute failure due to obstructive hydronephrosis related to [facility] - status post percutaneous nephrostomy tube today  E coli pyelonephritis w/ bacteremia In setting of acute obstructive hydronephrosis - cont abx but narrow to rocephin (relatively common incidence of cipro resistant E coli locally)  Acute Diastolic CHF CHF Team addressing this issue - CVP currently  7 - diuretic on hold given acute renal failure  Severe Mitral Valve regurgitation TCTS following, and they have placed Dental consult - for eventual valve surgery once medically stable otherwise  Acute encephalopathy  Multifactorial - follow w/ expected improvement in renal fxn   CAD - Elevated troponin / demand ischemia Cards following - to have cardiac cath when renal fxn allows   Parox Afib/flutter To consider Maze w/ MVR - Cards following - NSR on amiodarone presently with dose being decreased today due to junctional rhythm  Acute blood loss anemia in setting of Chronic anemia due to CKD Transfuse as needed with goal of hemoglobin 7.0 or greater  Recent Labs Lab 03/25/17 0850 03/25/17 1800 03/26/17 0424 03/26/17 0805 03/27/17 0338  HGB 9.1* 9.2* 8.5* 8.1* 7.1*     DM2 9/13 A1c 6.2 - CBG variable - avoid adjustment of tx today given unreliable intake at this time    Hypothyroidism Cont home synthroid dose   Hyperkalemia  Due to acute renal failure - medical treatment per nephrology - should improve rapidly now that percutaneous nephrostomy placed  Transamintis Likely hepatic congestion in setting of CHF - acute hepatitis panel negative - LFTs improving    DVT prophylaxis: SCDs Code Status: FULL CODE Family Communication: no family present at time of exam  Disposition Plan: SDU   Consultants:  CHF Team  Cardiology  TCTS PCCM  Procedures: 9/13 TTE - EF 60-65% - grade 2 DD - severe MR - mildly dilated LA and RA 9/18 TEE - severe regurgitation MV 9/28 R perc neph tube   Antimicrobials:  Azithromycin 9/15 > 9/17 Zosyn 9/27 Cipro 9/28  Rocephin 9/28 >  Objective: Blood pressure (!) 143/63, pulse (!) 55, temperature 98.6 F (37 C), temperature source Axillary, resp. rate (!) 30, height 5\' 9"  (1.753 m), weight 60.8 kg (134 lb 0.6 oz), SpO2 100 %.  Intake/Output Summary (Last 24 hours) at 03/27/17 1651 Last data filed at 03/27/17 1345  Gross per 24 hour   Intake              490 ml  Output              400 ml  Net               90 ml   Filed Weights   03/25/17 0438 03/26/17 0500 03/27/17 0314  Weight: 65 kg (143 lb 4.8 oz) 59 kg (130 lb 1.1 oz) 60.8 kg (134 lb 0.6 oz)    Examination: General: No acute respiratory distress - sedate  Lungs: no wheezing or focal crackles B fields  Cardiovascular: RRR w/o gallup or rub  Abdomen: Nondistended, bowel sounds positive Extremities: No signif edema B LE  CBC:  Recent Labs Lab 03/22/17 0914 03/24/17 0434  03/25/17 0433 03/25/17 0850 03/25/17 1800 03/26/17 0424 03/26/17 0805 03/27/17 0338  WBC 10.6* 11.4*  --  16.2*  --   --  49.8* 51.4* 42.0*  NEUTROABS 8.8*  --   --   --   --   --   --   --   --   HGB 9.5* 9.6*  < > 9.5* 9.1* 9.2* 8.5* 8.1* 7.1*  HCT 28.7* 28.4*  < > 28.2* 27.3* 27.0* 24.4* 23.0* 20.4*  MCV 75.7* 75.5*  --  75.8*  --   --  76.5* 75.9* 75.6*  PLT 222 211  --  229  --   --  191 170 141*  < > = values in this interval not displayed. Basic Metabolic Panel:  Recent Labs Lab 03/21/17 0444  03/24/17 0434 03/24/17 1749 03/25/17 0433 03/26/17 0424 03/26/17 0953 03/26/17 1500 03/26/17 2147 03/27/17 0338  NA 136  < > 135  --  134* 131* 131* 130*  --  133*  K 3.4*  < > 3.2* 4.1 4.8 5.8* 5.9* 5.9* 5.7* 6.1*  CL 103  < > 93*  --  93* 93* 93* 94*  --  97*  CO2 22  < > 31  --  31 26 25 26   --  23  GLUCOSE 165*  < > 138*  --  139* 202* 144* 165*  --  176*  BUN 51*  < > 30*  --  28* 30* 33* 36*  --  40*  CREATININE 3.00*  < > 2.73*  --  2.77* 3.26* 3.62* 3.61*  --  4.24*  CALCIUM 9.1  < > 9.0  --  9.5 9.4 9.7 9.3  --  9.3  MG  --   < > 1.9 1.8  --   --  1.8 1.8 1.6*  --   PHOS 3.4  --   --   --   --   --   --   --   --   --   < > = values in this interval not displayed. GFR: Estimated Creatinine Clearance: 12.7 mL/min (A) (by C-G formula based on SCr of 4.24 mg/dL (H)).  Liver Function Tests:  Recent Labs Lab 03/21/17 0444 03/25/17 0433 03/26/17 0953  AST  162* 46* 39  ALT 677* 193* 121*  ALKPHOS 85 120 109  BILITOT 1.3* 1.1 1.0  PROT 6.0* 5.6* 5.8*  ALBUMIN 3.2* 3.1* 2.7*    Coagulation Profile:  Recent Labs Lab 03/24/17 0753 03/26/17 1132 03/26/17 2147  INR 1.16 1.33 1.34    HbA1C: Hgb A1c MFr Bld  Date/Time Value Ref Range Status  03/12/2017 02:00 PM 6.2 (H) 4.8 - 5.6 % Final    Comment:    (NOTE) Pre diabetes:          5.7%-6.4% Diabetes:              >6.4% Glycemic control for   <7.0% adults with diabetes     CBG:  Recent Labs Lab 03/26/17 1729 03/26/17 1959 03/27/17 0920 03/27/17 1127 03/27/17 1622  GLUCAP 182* 233* 182* 119* 211*    Recent Results (from the past 240 hour(s))  Gram stain     Status: None   Collection Time: 03/24/17  4:23 PM  Result Value Ref Range Status   Specimen Description FLUID PLEURAL RIGHT  Final   Special Requests NONE  Final   Gram Stain   Final    WBC PRESENT, PREDOMINANTLY MONONUCLEAR NO ORGANISMS SEEN CYTOSPIN SMEAR    Report Status 03/24/2017 FINAL  Final  Fungus Culture With Stain     Status: None (Preliminary result)   Collection Time: 03/24/17  4:23 PM  Result Value Ref Range Status   Fungus Stain Final report  Final    Comment: (NOTE) Performed At: [facility] [place], [place] [id number] Lindon Romp MD [id number]    Fungus (Mycology) Culture PENDING  Incomplete   Fungal Source FLUID  Final    Comment: PLEURAL RIGHT   Acid Fast Smear (AFB)     Status: None   Collection Time: 03/24/17  4:23 PM  Result Value Ref Range Status   AFB Specimen Processing Concentration  Final   Acid Fast Smear Negative  Final    Comment: (NOTE) Performed At: [facility] [address] [place], [place] [id number] Lindon Romp MD XB:[id number]    Source (AFB) FLUID  Final    Comment: PLEURAL RIGHT   Culture, body fluid-bottle     Status: None (Preliminary result)   Collection Time: 03/24/17  4:23 PM  Result Value Ref Range  Status   Specimen Description FLUID PLEURAL RIGHT  Final   Special Requests BOTTLES DRAWN AEROBIC AND ANAEROBIC  Final   Culture NO GROWTH 3 DAYS  Final   Report Status PENDING  Incomplete  Fungus Culture Result     Status: None   Collection Time: 03/24/17  4:23 PM  Result Value Ref Range Status   Result 1 Comment  Final    Comment: (NOTE) KOH/Calcofluor preparation:  no fungus observed. Performed At: [facility] [place], [place] [id number] Lindon Romp MD [id number]   Urine Culture     Status: Abnormal (Preliminary result)   Collection Time: 03/26/17  8:07 AM  Result Value Ref Range Status   Specimen Description URINE, CATHETERIZED  Final   Special Requests NONE  Final   Culture >=100,000 COLONIES/mL GRAM NEGATIVE RODS (A)  Final   Report Status PENDING  Incomplete  Culture, blood (routine x 2)     Status: None (Preliminary result)   Collection Time: 03/26/17 10:40 AM  Result Value Ref Range Status   Specimen Description BLOOD LEFT HAND  Final   Special Requests IN PEDIATRIC BOTTLE Blood Culture adequate volume  Final   Culture NO  GROWTH 1 DAY  Final   Report Status PENDING  Incomplete  Culture, blood (routine x 2)     Status: None (Preliminary result)   Collection Time: 03/26/17 10:40 AM  Result Value Ref Range Status   Specimen Description BLOOD RIGHT HAND  Final   Special Requests IN PEDIATRIC BOTTLE Blood Culture adequate volume  Final   Culture  Setup Time   Final    GRAM NEGATIVE RODS IN PEDIATRIC BOTTLE CRITICAL RESULT CALLED TO, READ BACK BY AND VERIFIED WITH: G.ABBOTT PHARMD 03/27/17 0548 L.CHAMPION    Culture GRAM NEGATIVE RODS  Final   Report Status PENDING  Incomplete  Blood Culture ID Panel (Reflexed)     Status: Abnormal   Collection Time: 03/26/17 10:40 AM  Result Value Ref Range Status   Enterococcus species NOT DETECTED NOT DETECTED Final   Listeria monocytogenes NOT DETECTED NOT DETECTED Final   Staphylococcus species NOT  DETECTED NOT DETECTED Final   Staphylococcus aureus NOT DETECTED NOT DETECTED Final   Streptococcus species NOT DETECTED NOT DETECTED Final   Streptococcus agalactiae NOT DETECTED NOT DETECTED Final   Streptococcus pneumoniae NOT DETECTED NOT DETECTED Final   Streptococcus pyogenes NOT DETECTED NOT DETECTED Final   Acinetobacter baumannii NOT DETECTED NOT DETECTED Final   Enterobacteriaceae species DETECTED (A) NOT DETECTED Final    Comment: Enterobacteriaceae represent a large family of gram-negative bacteria, not a single organism. CRITICAL RESULT CALLED TO, READ BACK BY AND VERIFIED WITH: G.ABBOTT PHARMD 03/27/17 0548 L.CHAMPION    Enterobacter cloacae complex NOT DETECTED NOT DETECTED Final   Escherichia coli DETECTED (A) NOT DETECTED Final    Comment: CRITICAL RESULT CALLED TO, READ BACK BY AND VERIFIED WITH: G.ABBOTT PHARMD 03/27/17 0548 L.CHAMPION    Klebsiella oxytoca NOT DETECTED NOT DETECTED Final   Klebsiella pneumoniae NOT DETECTED NOT DETECTED Final   Proteus species NOT DETECTED NOT DETECTED Final   Serratia marcescens NOT DETECTED NOT DETECTED Final   Carbapenem resistance NOT DETECTED NOT DETECTED Final   Haemophilus influenzae NOT DETECTED NOT DETECTED Final   Neisseria meningitidis NOT DETECTED NOT DETECTED Final   Pseudomonas aeruginosa NOT DETECTED NOT DETECTED Final   Candida albicans NOT DETECTED NOT DETECTED Final   Candida glabrata NOT DETECTED NOT DETECTED Final   Candida krusei NOT DETECTED NOT DETECTED Final   Candida parapsilosis NOT DETECTED NOT DETECTED Final   Candida tropicalis NOT DETECTED NOT DETECTED Final  Culture, Urine     Status: Abnormal   Collection Time: 03/26/17  3:38 PM  Result Value Ref Range Status   Specimen Description URINE, RANDOM  Final   Special Requests NONE  Final   Culture MULTIPLE SPECIES PRESENT, SUGGEST RECOLLECTION (A)  Final   Report Status 03/27/2017 FINAL  Final     Scheduled Meds: . [START ON 03/28/2017] amiodarone   100 mg Oral Daily  . [START ON 03/28/2017] amLODipine  5 mg Oral Daily  . aspirin  81 mg Oral Daily  . docusate sodium  100 mg Oral BID  . fentaNYL      . hydrALAZINE  75 mg Oral Q8H  . insulin aspart  0-15 Units Subcutaneous TID WC  . insulin glargine  5 Units Subcutaneous Daily  . levothyroxine  112 mcg Oral QAC breakfast  . lidocaine      . mouth rinse  15 mL Mouth Rinse BID  . midazolam      . midazolam      . pantoprazole  40 mg Oral Daily  .  patiromer  8.4 g Oral Daily  . sodium chloride flush  10 mL Intravenous Q8H  . sodium chloride flush  10-40 mL Intracatheter Q12H  . vitamin C  250 mg Oral BID     LOS: 15 days   Cherene Altes, MD [facility] Office  [phone] Pager - Text Page per Shea Evans as per below:  On-Call/Text Page:      [email or web address]      password [email or web address]  If 7PM-7AM, please contact night-coverage [email or web address] Password [email or web address] 03/27/2017, 4:51 PM

## 2017-03-27 NOTE — Consult Note (Signed)
Chief Complaint: Patient was seen in consultation today for right percutaneous nephrostomy placement at the request of Dr Sheral Flow  Referring Physician(s): Dr Shirl Harris  Supervising Physician: Aletta Edouard  Patient Status: [facility] - In-pt  History of Present Illness: Jocelyn Hill is a 66 y.o. female   Severe CHF Acute on chronic renal insufficiency Substance abuse MV R and Afib Encephalopathy Started on Hep drip for cardiac issues Developed retroperitoneal bleed--- Bleed is compressing R ureter  CT: yesterday IMPRESSION: Stable right retroperitoneal hematoma is noted which extends along the right psoas muscle into the right pelvis and slightly in the right inguinal region. There is now noted moderate right hydronephrosis, most likely due to obstruction of distal ureter secondary to hematoma. Severe left renal atrophy is noted.  Worsening renal function Dr Moshe Cipro has requested  R percutaneous nephrostomy  Dr Linard Millers has reviewed imaging and approves procedure  Past Medical History:  Diagnosis Date  . Anxiety   . Arthritis   . Bronchitis   . Depression   . Diabetes mellitus without complication ([place])   . Diet-controlled diabetes mellitus ([place])   . GERD (gastroesophageal reflux disease)   . Hyperlipidemia   . Hypertension   . Hypothyroidism   . Stroke [facility])     Past Surgical History:  Procedure Laterality Date  . ABDOMINAL HYSTERECTOMY     PARTIALS  . IR THORACENTESIS ASP PLEURAL SPACE W/IMG GUIDE  03/24/2017  . TONSILLECTOMY    . TUBAL LIGATION      Allergies: Ace inhibitors  Medications: Prior to Admission medications   Medication Sig Start Date End Date Taking? Authorizing Provider  albuterol (PROVENTIL HFA;VENTOLIN HFA) 108 (90 Base) MCG/ACT inhaler Inhale 2 puffs into the lungs every 4 (four) hours as needed for wheezing or shortness of breath.   Yes [provider]  amLODipine (NORVASC) 10 MG tablet Take 10 mg by mouth  daily.   Yes [provider]  aspirin EC 81 MG tablet Take 81 mg by mouth daily.   Yes [provider]  atorvastatin (LIPITOR) 40 MG tablet Take 40 mg by mouth daily.   Yes [provider]  b complex vitamins capsule Take 1 capsule by mouth daily.   Yes [provider]  buPROPion (WELLBUTRIN XL) 300 MG 24 hr tablet Take 300 mg by mouth daily.   Yes [provider]  cholecalciferol (VITAMIN D) 1000 units tablet Take 1,000 Units by mouth daily.   Yes [provider]  DULoxetine (CYMBALTA) 30 MG capsule Take 30 mg by mouth daily.   Yes [provider]  gabapentin (NEURONTIN) 300 MG capsule Take 300 mg by mouth at bedtime.   Yes [provider]  hydrALAZINE (APRESOLINE) 100 MG tablet Take 100 mg by mouth 3 (three) times daily.   Yes [provider]  labetalol (NORMODYNE) 200 MG tablet Take 200 mg by mouth 2 (two) times daily.   Yes [provider]  levothyroxine (SYNTHROID, LEVOTHROID) 112 MCG tablet Take 112 mcg by mouth daily before breakfast.   Yes [provider]  minoxidil (LONITEN) 2.5 MG tablet Take by mouth daily.   Yes [provider]  montelukast (SINGULAIR) 10 MG tablet Take 10 mg by mouth daily.   Yes [provider]  Oxycodone HCl 10 MG TABS Take 10 mg by mouth 3 (three) times daily as needed (pain).   Yes [provider]  pantoprazole (PROTONIX) 40 MG tablet Take 40 mg by mouth daily.   Yes  [provider]  potassium gluconate (HM POTASSIUM) 595 (99 K) MG TABS tablet Take 595 mg by mouth daily.   Yes [provider]  pregabalin (LYRICA) 150 MG capsule Take 150 mg by mouth daily.   Yes [provider]  tiZANidine (ZANAFLEX) 4 MG capsule Take 4 mg by mouth 3 (three) times daily.   Yes [provider]  zolpidem (AMBIEN) 10 MG tablet Take 10 mg by mouth at bedtime as needed for sleep.   Yes [provider]  busPIRone  (BUSPAR) 15 MG tablet Take 15 mg by mouth 2 (two) times daily.    [provider]     History reviewed. No pertinent family history.  Social History   Social History  . Marital status: Divorced    Spouse name: N/A  . Number of children: N/A  . Years of education: N/A   Social History Main Topics  . Smoking status: Current Some Day Smoker    Types: Cigarettes  . Smokeless tobacco: Never Used  . Alcohol use No  . Drug use: No  . Sexual activity: No   Other Topics Concern  . None   Social History Narrative  . None    Review of Systems: A 12 point ROS discussed and pertinent positives are indicated in the HPI above.  All other systems are negative.  Review of Systems  Constitutional: Positive for activity change and appetite change.  Genitourinary: Positive for decreased urine volume.  Neurological: Positive for weakness.  Psychiatric/Behavioral: Positive for agitation and confusion.    Vital Signs: BP (!) 125/43 (BP Location: Left Arm)   Pulse (!) 59   Temp 97.6 F (36.4 C) (Axillary)   Resp 13   Ht 5\' 9"  (1.753 m)   Wt 134 lb 0.6 oz (60.8 kg)   SpO2 94%   BMI 19.79 kg/m   Physical Exam  Cardiovascular: Normal rate and regular rhythm.   Pulmonary/Chest: Effort normal and breath sounds normal.  Abdominal: Soft.  Musculoskeletal: Normal range of motion.  Neurological:  Confused Seems dazed  Skin: Skin is warm.  Psychiatric:  Consented son Germain Osgood via phone  Nursing note and vitals reviewed.   Imaging: Ct Abdomen Pelvis Wo Contrast  Result Date: 03/26/2017 CLINICAL DATA:  Retroperitoneal hematoma. EXAM: CT ABDOMEN AND PELVIS WITHOUT CONTRAST TECHNIQUE: Multidetector CT imaging of the abdomen and pelvis was performed following the standard protocol without IV contrast. COMPARISON:  CT scan of March 24, 2017. FINDINGS: Lower chest: Mild left posterior basilar atelectasis or infiltrate is noted. Hepatobiliary: Dilated gallbladder is again noted and  unchanged. No focal abnormality is seen in the liver on these unenhanced images. Pancreas: Unremarkable. No pancreatic ductal dilatation or surrounding inflammatory changes. Spleen: Normal in size without focal abnormality. Adrenals/Urinary Tract: Adrenal glands appear normal. Severe left renal atrophy is noted. Moderate right hydronephrosis with perinephric stranding is noted, which appears to be due to retroperitoneal hematoma described on prior exam. Foley catheter is present within the urinary bladder. No renal or ureteral calculi are noted. Stomach/Bowel: Stomach is within normal limits. Appendix appears normal. No evidence of bowel wall thickening, distention, or inflammatory changes. Vascular/Lymphatic: Aortic atherosclerosis. No enlarged abdominal or pelvic lymph nodes. Reproductive: Status post hysterectomy. No adnexal masses. Other: Stable right retroperitoneal hematoma is noted which extends along the right psoas muscle and into the right pelvis and slightly into the right inguinal region. Musculoskeletal: No acute or significant osseous findings. IMPRESSION: Stable right retroperitoneal hematoma is noted which extends along the right  psoas muscle into the right pelvis and slightly in the right inguinal region. There is now noted moderate right hydronephrosis, most likely due to obstruction of distal ureter secondary to hematoma. Severe left renal atrophy is noted. Aortic atherosclerosis. Dilated gallbladder is noted without definite surrounding inflammation. Electronically Signed   By: Marijo Conception, M.D.   On: 03/26/2017 15:40   Ct Abdomen Pelvis Wo Contrast  Result Date: 03/24/2017 CLINICAL DATA:  Abdominal pain.  Evaluate for appendicitis EXAM: CT ABDOMEN AND PELVIS WITHOUT CONTRAST TECHNIQUE: Multidetector CT imaging of the abdomen and pelvis was performed following the standard protocol without IV contrast. COMPARISON:  None. FINDINGS: Lower chest: There are bilateral pleural effusions noted  left greater than right. The airspace densities within the left lower lobe and lingula noted. There is compressive type atelectasis in the right base. Hepatobiliary: No focal liver abnormality. Gallbladder appears distended. No biliary dilatation. No gallstones identified. Pancreas: Within the limitations of unenhanced technique the pancreas appears unremarkable. Spleen: Normal in size without focal abnormality. Adrenals/Urinary Tract: The right kidney appears normal. Asymmetric atrophy of the left kidney. Normal adrenal glands. The urinary bladder is partially collapsed around a Foley catheter balloon. Stomach/Bowel: The stomach appears normal. The small bowel loops are normal in course and caliber. Normal appearance of the colon. There is a small caliber contrast filled structure within the right lower quadrant which is favored to represent a normal appendix, image 73 of series 3. No pathologic dilatation of the colon. Vascular/Lymphatic: Aortic atherosclerosis. No aneurysm. No adenopathy within the abdomen or pelvis. Reproductive: Uterus is not well visualized and may be atrophic or surgically absent. Other: There is asymmetric, high attenuation enlargement of the right ileo psoas muscle extending into the right pelvic sidewall and right inguinal region compatible with retroperitoneal and extraperitoneal hematoma of the abdomen and pelvis. Hematocrit arrival is identified within the right psoas muscle hematoma, image number 74 of series 3. Hematoma including the psoas muscle measures 6.5 x 5.2 x 14.8 cm. Musculoskeletal: Mild degenerative disc disease identified within the lumbar spine. IMPRESSION: 1. A right retroperitoneal hematoma is suspected extending into the right side of pelvis and right groin region. A hematocrit level is identified within the asymmetrically enlarged right psoas muscle hematoma. 2. The appendix is normal. 3.  Aortic Atherosclerosis (ICD10-I70.0). 4. Gallbladder hydrops. Etiology and  clinical significance indeterminate. This could be better assessed with right upper quadrant sonogram. 5. Bilateral pleural effusions with diminished aeration to the lung bases. 6. Critical Value/emergent results were called by telephone at the time of interpretation on 03/24/2017 at 5:59 pm to Dr. Dia Crawford , who verbally acknowledged these results. Electronically Signed   By: Kerby Moors M.D.   On: 03/24/2017 17:59   Dg Orthopantogram  Result Date: 03/23/2017 CLINICAL DATA:  Poor dentition.  Cardiac issues. EXAM: ORTHOPANTOGRAM/PANORAMIC COMPARISON:  None. FINDINGS: Mandible is intact. Numerous absent teeth. Multiple dental caries involving maxillary teeth, difficult to number due to overlapping incisors. Approximate tooth 3 periapical lucency/abscess. IMPRESSION: Poor dentition with multiple maxillary dental caries and, approximate tooth 3 periapical abscess. Electronically Signed   By: Elon Alas M.D.   On: 03/23/2017 19:56   Dg Chest 1 View  Result Date: 03/24/2017 CLINICAL DATA:  Post thoracentesis EXAM: CHEST 1 VIEW COMPARISON:  03/23/2017, 03/22/2017 FINDINGS: Right-sided catheter sheath remains in place with kink at the supraclavicular region. The tip projects over the venous confluence. Decreased right-sided pleural effusion with improved aeration of the right lung base. Persistent small left effusion with  dense consolidation at the left lung base. Hazy atelectasis or infiltrate at the right lung base. No pneumothorax is seen. Stable cardiomegaly. IMPRESSION: 1. Decreased right pleural effusion post thoracentesis. No definitive pneumothorax seen 2. Continued left pleural effusion with dense left lower lobe atelectasis or pneumonia. Hazy atelectasis or infiltrate at the right base 3. Cardiomegaly unchanged Electronically Signed   By: Donavan Foil M.D.   On: 03/24/2017 16:29   Dg Chest Port 1 View  Result Date: 03/26/2017 CLINICAL DATA:  Pneumonia EXAM: PORTABLE CHEST 1 VIEW  COMPARISON:  03/24/2017 FINDINGS: Cardiomegaly. Right lung is clear. Continued left lower lobe airspace opacity and probable small left effusion. Aeration is slightly improved in the left base. Right internal jugular vascular sheath again noted, unchanged. IMPRESSION: Continued left pleural effusion with left lower lobe atelectasis or infiltrate, both improved since prior study. No confluent opacity on the right. Stable cardiomegaly. Electronically Signed   By: Rolm Baptise M.D.   On: 03/26/2017 10:37   Dg Chest Port 1 View  Result Date: 03/23/2017 CLINICAL DATA:  Follow-up bilateral alveolar opacities. EXAM: PORTABLE CHEST 1 VIEW COMPARISON:  Portable chest x-ray of March 22, 2017 FINDINGS: The lungs are adequately inflated. Bibasilar parenchymal consolidation persists. The interstitial markings elsewhere are mildly prominent. The cardiac silhouette is enlarged. The pulmonary vascularity is engorged. There is calcification in the wall of the aortic arch. The right internal jugular Cordis sheath tip projects over the proximal SVC. There is a kink in this catheter as it enters the scan. IMPRESSION: Bibasilar pneumonia. Small bilateral pleural effusions. Parenchymal consolidation at the right base has increased since yesterday's study. Mild CHF. Thoracic aortic atherosclerosis. Electronically Signed   By: David  Martinique M.D.   On: 03/23/2017 07:37   Dg Chest Port 1 View  Result Date: 03/22/2017 CLINICAL DATA:  Increasing shortness of breath EXAM: PORTABLE CHEST 1 VIEW COMPARISON:  March 21, 2017 FINDINGS: No pneumothorax. Interstitial opacities throughout the left lung are stable. Effusion and opacity in the right base is mildly improved. Focal infiltrate in the left retrocardiac region is unchanged. No other interval changes. IMPRESSION: Persistent pulmonary opacities as above.  Suggested small effusions. Electronically Signed   By: Dorise Bullion III M.D   On: 03/22/2017 10:22   Dg Chest Port 1  View  Result Date: 03/21/2017 CLINICAL DATA:  Shortness of breath. Concern for pulmonary edema. History of diabetes and hypertension. EXAM: PORTABLE CHEST 1 VIEW COMPARISON:  Chest x-rays dated 03/18/2017 and 03/17/2017. FINDINGS: Increasing central pulmonary vascular congestion and perihilar interstitial edema. Bibasilar opacities, some component likely related to associated pulmonary edema. Suspect additional atelectasis and/or small pleural effusions at each lung base. No pneumothorax seen. Stable cardiomegaly. Atherosclerotic changes noted at the aortic arch. Endotracheal tube and enteric tube have been removed. IMPRESSION: 1. Increasing central pulmonary vascular congestion and perihilar edema indicating CHF/volume overload. 2. Bibasilar opacities are not significantly changed, most likely a combination of atelectasis and small pleural effusions. Suspect some degree of superimposed pulmonary edema. Additional underlying pneumonia cannot be excluded if febrile. 3. Stable cardiomegaly. 4. Aortic atherosclerosis. Electronically Signed   By: Franki Cabot M.D.   On: 03/21/2017 09:20   Dg Chest Port 1 View  Result Date: 03/18/2017 CLINICAL DATA:  Respiratory failure EXAM: PORTABLE CHEST 1 VIEW COMPARISON:  03/17/2017 FINDINGS: Endotracheal tube and NG tube are unchanged. Cardiomegaly with vascular congestion. Layering right effusion with right lower lobe atelectasis or infiltrate and left retrocardiac opacity, unchanged. Possible small left effusion. IMPRESSION: Continued bilateral effusions,  right greater the left with bibasilar atelectasis or infiltrates. Cardiomegaly, vascular congestion. Electronically Signed   By: Rolm Baptise M.D.   On: 03/18/2017 08:52   Portable Chest Xray  Result Date: 03/17/2017 CLINICAL DATA:  Intubated, respiratory failure EXAM: PORTABLE CHEST 1 VIEW COMPARISON:  Chest radiograph from one day prior. FINDINGS: Endotracheal tube tip is 5.4 cm above the carina. Enteric tube enters  stomach with the tip not seen on this image. Right internal jugular central venous sheath terminates over the right upper mediastinum. Stable cardiomediastinal silhouette with mild cardiomegaly and aortic atherosclerosis. No pneumothorax. Small stable bilateral pleural effusions, right greater than left. Mild pulmonary edema, decreased. Bibasilar lung opacities are stable. IMPRESSION: 1. Well-positioned support structures.  No pneumothorax. 2. Mild congestive heart failure, improved. 3. Stable small bilateral pleural effusions, right greater than left with bibasilar lung opacities, favor atelectasis. Electronically Signed   By: Ilona Sorrel M.D.   On: 03/17/2017 07:55   Dg Chest Port 1 View  Result Date: 03/16/2017 CLINICAL DATA:  Central venous line placement. EXAM: PORTABLE CHEST 1 VIEW COMPARISON:  03/16/2017 at 1304 hours FINDINGS: New right internal jugular central venous introducer sheath has its tip projecting upper superior vena cava. No pneumothorax. Endotracheal tube and nasal/ orogastric tube are unchanged from prior exam, satisfactorily positioned. No change in the cardiomegaly or bilateral lung base opacities/pleural effusions. IMPRESSION: 1. New right internal jugular introducer sheath with its tip in the upper superior vena cava. No pneumothorax. No other change from the earlier study. Electronically Signed   By: Lajean Manes M.D.   On: 03/16/2017 15:16   Portable Chest Xray  Result Date: 03/16/2017 CLINICAL DATA:  66 year old female who coded today status post CPR EXAM: PORTABLE CHEST 1 VIEW COMPARISON:  0759 hours today and earlier. FINDINGS: Portable AP semi upright view at at 1304 hours. Intubated. Endotracheal tube tip in good position between the level the clavicles and carina. Enteric tube placed and courses to the abdomen. Side hole is at the level of the gastric body. Left and midline chest pacer or resuscitation pads in place. Stable cardiomegaly and mediastinal contours. Continued  veiling opacity at both lung bases. No pneumothorax. Stable pulmonary vascularity, no acute edema. No areas of worsening ventilation. No displaced rib fracture or acute osseous abnormality identified. IMPRESSION: 1. Endotracheal tube tip in good position. Enteric tube courses to the abdomen, side hole at the level of the stomach. 2. Stable ventilation from earlier today; bilateral pleural effusions with lower lobe collapse or consolidation. 3. Stable cardiomegaly and mediastinal contours. Electronically Signed   By: Genevie Ann M.D.   On: 03/16/2017 13:27   Dg Chest Port 1 View  Result Date: 03/16/2017 CLINICAL DATA:  Increased SOB, CP. Abdominal pain and constipation. No BM in 48 hours. Hx of DM, HTN, stroke. Smoker. EXAM: PORTABLE CHEST 1 VIEW COMPARISON:  03/13/2017 FINDINGS: Stable cardiomegaly. Stable bilateral pleural effusions and bibasilar atelectasis. Stable pulmonary interstitial prominence, consistent with mild interstitial edema. No pneumothorax visualized. IMPRESSION: No significant change in cardiomegaly, bilateral pleural effusions, and probable mild pulmonary interstitial edema Electronically Signed   By: Earle Gell M.D.   On: 03/16/2017 08:16   Dg Chest Port 1 View  Result Date: 03/13/2017 CLINICAL DATA:  CHF. EXAM: PORTABLE CHEST 1 VIEW COMPARISON:  03/12/2017. FINDINGS: Cardiomegaly with bilateral from interstitial prominence and bilateral pleural effusions consistent CHF. No change prior exam. No evidence of pneumothorax. No acute bony abnormality. IMPRESSION: Congestive heart failure with pulmonary interstitial edema and bilateral pleural  effusions. No change from prior exam. Electronically Signed   By: Marcello Moores  Register   On: 03/13/2017 06:23   Dg Abd Portable 1v  Result Date: 03/17/2017 CLINICAL DATA:  Orogastric tube placement EXAM: PORTABLE ABDOMEN - 1 VIEW COMPARISON:  March 16, 2017 FINDINGS: Orogastric tube tip and side port in stomach. There is a paucity of bowel gas. There  is no bowel dilatation or air-fluid level to suggest obstruction. No free air. There are pleural effusions bilaterally with bibasilar consolidation. There is cardiomegaly. IMPRESSION: Orogastric tube tip and side port in stomach. Paucity of bowel gas may be seen normally but may also be indicative of enteritis or early ileus. Bowel obstruction not felt to be likely. No free air. Cardiomegaly. Question alveolar edema versus pneumonia in the bases. Electronically Signed   By: Lowella Grip III M.D.   On: 03/17/2017 11:02   Dg Abd Portable 1v  Result Date: 03/16/2017 CLINICAL DATA:  Abdominal pain.  Constipation. EXAM: PORTABLE ABDOMEN - 1 VIEW COMPARISON:  None. FINDINGS: The bowel gas pattern is normal with minimal air scattered throughout bowel. No excessive stool in the colon. No fecal impaction. Extensive calcification in the abdominal aorta and iliac arteries. No significant bone abnormality. IMPRESSION: Benign-appearing abdomen.  No excessive stool in the colon. Aortic atherosclerosis. Electronically Signed   By: Lorriane Shire M.D.   On: 03/16/2017 08:17   Ir Thoracentesis Asp Pleural Space W/img Guide  Result Date: 03/24/2017 INDICATION: History of right-sided pleural effusion noted on x-ray. Request is made for diagnostic and therapeutic thoracentesis. EXAM: ULTRASOUND GUIDED DIAGNOSTIC AND THERAPEUTIC THORACENTESIS MEDICATIONS: 1% lidocaine COMPLICATIONS: None immediate. PROCEDURE: An ultrasound guided thoracentesis was thoroughly discussed with the patient and questions answered. The benefits, risks, alternatives and complications were also discussed. The patient understands and wishes to proceed with the procedure. Written consent was obtained. Ultrasound was performed to localize and mark an adequate pocket of fluid in the right chest. The area was then prepped and draped in the normal sterile fashion. 1% Lidocaine was used for local anesthesia. Under ultrasound guidance a Safe-T-Centesis  catheter was introduced. Thoracentesis was performed. The catheter was removed and a dressing applied. FINDINGS: A total of approximately 0.35 L of serous fluid was removed. Samples were sent to the laboratory as requested by the clinical team. IMPRESSION: Successful ultrasound guided right thoracentesis yielding 0.35 L of pleural fluid. Read by: Saverio Danker, PA-C Electronically Signed   By: Aletta Edouard M.D.   On: 03/24/2017 16:38    Labs:  CBC:  Recent Labs  03/25/17 0433  03/25/17 1800 03/26/17 0424 03/26/17 0805 03/27/17 0338  WBC 16.2*  --   --  49.8* 51.4* 42.0*  HGB 9.5*  < > 9.2* 8.5* 8.1* 7.1*  HCT 28.2*  < > 27.0* 24.4* 23.0* 20.4*  PLT 229  --   --  191 170 141*  < > = values in this interval not displayed.  COAGS:  Recent Labs  03/17/17 0439 03/24/17 0753 03/26/17 1132 03/26/17 2147  INR 1.53 1.16 1.33 1.34  APTT  --   --  63*  --     BMP:  Recent Labs  03/26/17 0424 03/26/17 0953 03/26/17 1500 03/26/17 2147 03/27/17 0338  NA 131* 131* 130*  --  133*  K 5.8* 5.9* 5.9* 5.7* 6.1*  CL 93* 93* 94*  --  97*  CO2 26 25 26   --  23  GLUCOSE 202* 144* 165*  --  176*  BUN 30* 33* 36*  --  40*  CALCIUM 9.4 9.7 9.3  --  9.3  CREATININE 3.26* 3.62* 3.61*  --  4.24*  GFRNONAA 14* 12* 12*  --  10*  GFRAA 16* 14* 14*  --  12*    LIVER FUNCTION TESTS:  Recent Labs  03/16/17 1403  03/20/17 0418 03/21/17 0444 03/25/17 0433 03/26/17 0953  BILITOT 1.4*  --   --  1.3* 1.1 1.0  AST 395*  --   --  162* 46* 39  ALT 440*  --   --  677* 193* 121*  ALKPHOS 81  --   --  85 120 109  PROT 5.4*  --   --  6.0* 5.6* 5.8*  ALBUMIN 2.9*  < > 3.2* 3.2* 3.1* 2.7*  < > = values in this interval not displayed.  TUMOR MARKERS: No results for input(s): AFPTM, CEA, CA199, CHROMGRNA in the last 8760 hours.  Assessment and Plan:  R hydronephrosis secondary compression/obstruction Rt Ureter Retroperitoneal bleed Now scheduled for R PCN in IR Risks and benefits of  percutaneous nephrostomy placement were discussed with the patient's son via phone including, but not limited to, infection, bleeding, significant bleeding causing loss or decrease in renal function or damage to adjacent structures.  All of his questions were answered, he is agreeable to proceed. Consent signed and in chart.  Thank you for this interesting consult.  I greatly enjoyed meeting Jocelyn Hill and look forward to participating in their care.  A copy of this report was sent to the requesting provider on this date.  Electronically Signed: Lavonia Drafts, PA-C 03/27/2017, 10:55 AM   I spent a total of 40 Minutes    in face to face in clinical consultation, greater than 50% of which was counseling/coordinating care for R PCN

## 2017-03-27 NOTE — Plan of Care (Signed)
Problem: Pain Managment: Goal: General experience of comfort will improve Outcome: Not Progressing Pt has reported minimal relief from her current pain meds. D/t the complexities of her present illness she will likely continue to have unrelieved pain without further medical intervention

## 2017-03-27 NOTE — Sedation Documentation (Signed)
Patient was restless and anxious throughout procedure.

## 2017-03-27 NOTE — Care Management Important Message (Signed)
Important Message  Patient Details  Name: Jocelyn Hill MRN: [id number] Date of Birth: Jan 30, 1951   Medicare Important Message Given:  Yes    Nathen May 03/27/2017, 2:30 PM

## 2017-03-27 NOTE — Progress Notes (Signed)
[facility] Progress Note  Subjective:  Still dealing with retroperitoneal bleed- heparin stopped. Only 600 of urine yest-  no lasix  crt worse - c/o abd pain, more confused - hgb down  but WBC down but still  is 42 k ! And K of 6.1- BP if anything is up - is in much pain   Vitals:   03/26/17 2014 03/27/17 0001 03/27/17 0314 03/27/17 0718  BP: (!) 141/58 (!) 133/55 126/60 (!) 125/43  Pulse: (!) 115 (!) 53 (!) 59   Resp: 15 (!) 24 (!) 24 13  Temp: 97.9 F (36.6 C) 98.1 F (36.7 C) 97.6 F (36.4 C)   TempSrc: Oral Oral Axillary   SpO2: 96% 97% 94%   Weight:   60.8 kg (134 lb 0.6 oz)   Height:        Inpatient medications: . [START ON 03/28/2017] amiodarone  100 mg Oral Daily  . [START ON 03/28/2017] amLODipine  5 mg Oral Daily  . aspirin  81 mg Per Tube Daily  . docusate sodium  100 mg Oral BID  . hydrALAZINE  75 mg Oral Q8H  . insulin aspart  0-15 Units Subcutaneous TID WC  . insulin glargine  5 Units Subcutaneous Daily  . levothyroxine  112 mcg Per Tube QAC breakfast  . mouth rinse  15 mL Mouth Rinse BID  . pantoprazole  40 mg Oral Daily  . patiromer  8.4 g Oral Daily  . sodium chloride flush  10-40 mL Intracatheter Q12H  . vitamin C  250 mg Oral BID   . sodium chloride    . piperacillin-tazobactam (ZOSYN)  IV Stopped (03/27/17 6789)   acetaminophen **OR** acetaminophen, bisacodyl, haloperidol lactate, hydrALAZINE, HYDROmorphone (DILAUDID) injection, levalbuterol, LORazepam, ondansetron (ZOFRAN) IV, sennosides, sodium chloride flush  Exam: On nasal O2, not in distress Chest rales R base, L clear RRR no RG, 3/6 sem Abd soft ntnd no ascites Ext no LE edema NF, follows commands   Impression: 1.  Acute on CKD 4 - worse today again ( baseline- reported around 2.1)- previous worse vol status w/ pulm edema.  In need of heart cath and valve replacement; with CKD stage IV bordering stage V needed higher doses of lasix  to stabilize her first.   responded.    they are agreeable to dialysis if needed. 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  Pulm edema - resolved 3.  Severe MR - per cards- sounds like cardiac cath and surgical intervention way down the line- needs rehab 4.  HTN - bp's high, vol excess- also on norvasc 10 and hydralazine 75 q 8 (increased)  - lasix stopped 5.  Hx CVA/ depression/ chronic pain 6. Anemia- giving iron- RPB- hgb drop- heparin stopped - cont to follow for transfusion need 7. Hypokalemia- had been repleting- stop scheduled ,  now needs veltassa to bring down- needs nephrostomy tube to correct  8. Elevated WBC- unsure etiology- cultures ordered, now on zosyn      Jade Burright A  03/27/2017, 10:17 AM    Recent Labs Lab 03/21/17 0444  03/26/17 0953 03/26/17 1500 03/26/17 2147 03/27/17 0338  NA 136  < > 131* 130*  --  133*  K 3.4*  < > 5.9* 5.9* 5.7* 6.1*  CL 103  < > 93* 94*  --  97*  CO2  22  < > 25 26  --  23  GLUCOSE 165*  < > 144* 165*  --  176*  BUN 51*  < > 33* 36*  --  40*  CREATININE 3.00*  < > 3.62* 3.61*  --  4.24*  CALCIUM 9.1  < > 9.7 9.3  --  9.3  PHOS 3.4  --   --   --   --   --   < > = values in this interval not displayed.  Recent Labs Lab 03/21/17 0444 03/25/17 0433 03/26/17 0953  AST 162* 46* 39  ALT 677* 193* 121*  ALKPHOS 85 120 109  BILITOT 1.3* 1.1 1.0  PROT 6.0* 5.6* 5.8*  ALBUMIN 3.2* 3.1* 2.7*    Recent Labs Lab 03/22/17 0914  03/26/17 0424 03/26/17 0805 03/27/17 0338  WBC 10.6*  < > 49.8* 51.4* 42.0*  NEUTROABS 8.8*  --   --   --   --   HGB 9.5*  < > 8.5* 8.1* 7.1*  HCT 28.7*  < > 24.4* 23.0* 20.4*  MCV 75.7*  < > 76.5* 75.9* 75.6*  PLT 222  < > 191 170 141*  < > = values in this interval not displayed. Iron/TIBC/Ferritin/ %Sat    Component Value Date/Time   IRON 62 03/20/2017 0406    TIBC 256 03/20/2017 0406   FERRITIN 148 03/20/2017 0406   IRONPCTSAT 24 03/20/2017 0406

## 2017-03-28 ENCOUNTER — Other Ambulatory Visit: Payer: Self-pay

## 2017-03-28 LAB — GLUCOSE, CAPILLARY
Glucose-Capillary: 96 mg/dL (ref 65–99)
Glucose-Capillary: 162 mg/dL — ABNORMAL HIGH (ref 65–99)
Glucose-Capillary: 130 mg/dL — ABNORMAL HIGH (ref 65–99)
Glucose-Capillary: 105 mg/dL — ABNORMAL HIGH (ref 65–99)
Glucose-Capillary: 153 mg/dL — ABNORMAL HIGH (ref 65–99)

## 2017-03-28 LAB — RENAL FUNCTION PANEL
Albumin: 2.6 g/dL — ABNORMAL LOW (ref 3.5–5.0)
Anion gap: 14 (ref 5–15)
BUN: 53 mg/dL — ABNORMAL HIGH (ref 6–20)
CO2: 21 mmol/L — ABNORMAL LOW (ref 22–32)
Calcium: 8.9 mg/dL (ref 8.9–10.3)
Chloride: 99 mmol/L — ABNORMAL LOW (ref 101–111)
Creatinine, Ser: 4.92 mg/dL — ABNORMAL HIGH (ref 0.44–1.00)
GFR calc Af Amer: 10 mL/min — ABNORMAL LOW (ref >60)
GFR calc non Af Amer: 8 mL/min — ABNORMAL LOW (ref >60)
Glucose, Bld: 145 mg/dL — ABNORMAL HIGH (ref 65–99)
Phosphorus: 5 mg/dL — ABNORMAL HIGH (ref 2.5–4.6)
Potassium: 5.1 mmol/L (ref 3.5–5.1)
Sodium: 134 mmol/L — ABNORMAL LOW (ref 135–145)

## 2017-03-28 LAB — CBC
HCT: 22.2 % — ABNORMAL LOW (ref 36.0–46.0)
Hemoglobin: 7.4 g/dL — ABNORMAL LOW (ref 12.0–15.0)
MCH: 25.3 pg — ABNORMAL LOW (ref 26.0–34.0)
MCHC: 33.3 g/dL (ref 30.0–36.0)
MCV: 76 fL — ABNORMAL LOW (ref 78.0–100.0)
Platelets: 146 10*3/uL — ABNORMAL LOW (ref 150–400)
RBC: 2.92 MIL/uL — ABNORMAL LOW (ref 3.87–5.11)
RDW: 15.6 % — ABNORMAL HIGH (ref 11.5–15.5)
WBC: 27.8 10*3/uL — ABNORMAL HIGH (ref 4.0–10.5)

## 2017-03-28 MED ORDER — LORAZEPAM 0.5 MG PO TABS
0.5000 mg | ORAL_TABLET | ORAL | Status: DC | PRN
  Administered 2017-03-28 (×2): 0.5 mg via ORAL
  Filled 2017-03-28 (×2): qty 1

## 2017-03-28 MED ORDER — FUROSEMIDE 10 MG/ML IJ SOLN
40.0000 mg | Freq: Two times a day (BID) | INTRAMUSCULAR | Status: DC
  Administered 2017-03-28 – 2017-03-31 (×7): 40 mg via INTRAVENOUS
  Filled 2017-03-28 (×7): qty 4

## 2017-03-28 MED ORDER — FENTANYL CITRATE (PF) 100 MCG/2ML IJ SOLN
50.0000 ug | INTRAMUSCULAR | Status: DC | PRN
  Administered 2017-03-29 – 2017-04-02 (×20): 50 ug via INTRAVENOUS
  Filled 2017-03-28 (×20): qty 2

## 2017-03-28 MED ORDER — LORAZEPAM 2 MG/ML IJ SOLN
0.5000 mg | INTRAMUSCULAR | Status: DC | PRN
  Administered 2017-03-28 – 2017-03-29 (×2): 1 mg via INTRAVENOUS
  Filled 2017-03-28 (×2): qty 1

## 2017-03-28 MED ORDER — FENTANYL CITRATE (PF) 100 MCG/2ML IJ SOLN
25.0000 ug | INTRAMUSCULAR | Status: DC | PRN
  Administered 2017-03-28: 50 ug via INTRAVENOUS
  Filled 2017-03-28: qty 2

## 2017-03-28 NOTE — Progress Notes (Signed)
Patient ID: Jocelyn Hill. Ol[place], female   DOB: 24-May-1951, 66 y.o.   MRN: [id number]    Referring Physician(s): Dr. Corliss Parish  Supervising Physician: Sandi Mariscal  Patient Status: [facility] - In-pt  Chief Complaint: Obstructing right ureter  Subjective: Patient is sedated but does arouse.    Allergies: Ace inhibitors  Medications: Prior to Admission medications   Medication Sig Start Date End Date Taking? Authorizing Provider  albuterol (PROVENTIL HFA;VENTOLIN HFA) 108 (90 Base) MCG/ACT inhaler Inhale 2 puffs into the lungs every 4 (four) hours as needed for wheezing or shortness of breath.   Yes [provider]  amLODipine (NORVASC) 10 MG tablet Take 10 mg by mouth daily.   Yes [provider]  aspirin EC 81 MG tablet Take 81 mg by mouth daily.   Yes [provider]  atorvastatin (LIPITOR) 40 MG tablet Take 40 mg by mouth daily.   Yes [provider]  b complex vitamins capsule Take 1 capsule by mouth daily.   Yes [provider]  buPROPion (WELLBUTRIN XL) 300 MG 24 hr tablet Take 300 mg by mouth daily.   Yes [provider]  cholecalciferol (VITAMIN D) 1000 units tablet Take 1,000 Units by mouth daily.   Yes [provider]  DULoxetine (CYMBALTA) 30 MG capsule Take 30 mg by mouth daily.   Yes [provider]  gabapentin (NEURONTIN) 300 MG capsule Take 300 mg by mouth at bedtime.   Yes [provider]  hydrALAZINE (APRESOLINE) 100 MG tablet Take 100 mg by mouth 3 (three) times daily.   Yes [provider]  labetalol (NORMODYNE) 200 MG tablet Take 200 mg by mouth 2 (two) times daily.   Yes [provider]  levothyroxine (SYNTHROID, LEVOTHROID) 112 MCG tablet Take 112 mcg by mouth daily before breakfast.   Yes [provider]  minoxidil (LONITEN) 2.5 MG tablet Take by mouth daily.   Yes [provider]  montelukast (SINGULAIR) 10 MG tablet Take 10 mg by mouth daily.    Yes [provider]  Oxycodone HCl 10 MG TABS Take 10 mg by mouth 3 (three) times daily as needed (pain).   Yes [provider]  pantoprazole (PROTONIX) 40 MG tablet Take 40 mg by mouth daily.   Yes [provider]  potassium gluconate (HM POTASSIUM) 595 (99 K) MG TABS tablet Take 595 mg by mouth daily.   Yes [provider]  pregabalin (LYRICA) 150 MG capsule Take 150 mg by mouth daily.   Yes [provider]  tiZANidine (ZANAFLEX) 4 MG capsule Take 4 mg by mouth 3 (three) times daily.   Yes [provider]  zolpidem (AMBIEN) 10 MG tablet Take 10 mg by mouth at bedtime as needed for sleep.   Yes [provider]  busPIRone (BUSPAR) 15 MG tablet Take 15 mg by mouth 2 (two) times daily.    [provider]    Vital Signs: BP 112/78   Pulse (!) 56   Temp 99.6 F (37.6 C) (Axillary)   Resp 19   Ht 5\' 9"  (1.753 m)   Wt 134 lb 4.2 oz (60.9 kg)   SpO2 95%   BMI 19.83 kg/m   Physical Exam: Abd: soft, tender, right flank with PCN in place with light pink colored urine output.  Site is c/d/i.  525cc documented in last 24 hrs.  Imaging: Ct Abdomen Pelvis Wo Contrast  Result Date: 03/26/2017 CLINICAL DATA:  Retroperitoneal hematoma. EXAM: CT ABDOMEN AND PELVIS  WITHOUT CONTRAST TECHNIQUE: Multidetector CT imaging of the abdomen and pelvis was performed following the standard protocol without IV contrast. COMPARISON:  CT scan of March 24, 2017. FINDINGS: Lower chest: Mild left posterior basilar atelectasis or infiltrate is noted. Hepatobiliary: Dilated gallbladder is again noted and unchanged. No focal abnormality is seen in the liver on these unenhanced images. Pancreas: Unremarkable. No pancreatic ductal dilatation or surrounding inflammatory changes. Spleen: Normal in size without focal abnormality. Adrenals/Urinary Tract: Adrenal glands appear normal. Severe left renal atrophy is noted. Moderate right hydronephrosis with  perinephric stranding is noted, which appears to be due to retroperitoneal hematoma described on prior exam. Foley catheter is present within the urinary bladder. No renal or ureteral calculi are noted. Stomach/Bowel: Stomach is within normal limits. Appendix appears normal. No evidence of bowel wall thickening, distention, or inflammatory changes. Vascular/Lymphatic: Aortic atherosclerosis. No enlarged abdominal or pelvic lymph nodes. Reproductive: Status post hysterectomy. No adnexal masses. Other: Stable right retroperitoneal hematoma is noted which extends along the right psoas muscle and into the right pelvis and slightly into the right inguinal region. Musculoskeletal: No acute or significant osseous findings. IMPRESSION: Stable right retroperitoneal hematoma is noted which extends along the right psoas muscle into the right pelvis and slightly in the right inguinal region. There is now noted moderate right hydronephrosis, most likely due to obstruction of distal ureter secondary to hematoma. Severe left renal atrophy is noted. Aortic atherosclerosis. Dilated gallbladder is noted without definite surrounding inflammation. Electronically Signed   By: Marijo Conception, M.D.   On: 03/26/2017 15:40   Ct Abdomen Pelvis Wo Contrast  Result Date: 03/24/2017 CLINICAL DATA:  Abdominal pain.  Evaluate for appendicitis EXAM: CT ABDOMEN AND PELVIS WITHOUT CONTRAST TECHNIQUE: Multidetector CT imaging of the abdomen and pelvis was performed following the standard protocol without IV contrast. COMPARISON:  None. FINDINGS: Lower chest: There are bilateral pleural effusions noted left greater than right. The airspace densities within the left lower lobe and lingula noted. There is compressive type atelectasis in the right base. Hepatobiliary: No focal liver abnormality. Gallbladder appears distended. No biliary dilatation. No gallstones identified. Pancreas: Within the limitations of unenhanced technique the pancreas  appears unremarkable. Spleen: Normal in size without focal abnormality. Adrenals/Urinary Tract: The right kidney appears normal. Asymmetric atrophy of the left kidney. Normal adrenal glands. The urinary bladder is partially collapsed around a Foley catheter balloon. Stomach/Bowel: The stomach appears normal. The small bowel loops are normal in course and caliber. Normal appearance of the colon. There is a small caliber contrast filled structure within the right lower quadrant which is favored to represent a normal appendix, image 73 of series 3. No pathologic dilatation of the colon. Vascular/Lymphatic: Aortic atherosclerosis. No aneurysm. No adenopathy within the abdomen or pelvis. Reproductive: Uterus is not well visualized and may be atrophic or surgically absent. Other: There is asymmetric, high attenuation enlargement of the right ileo psoas muscle extending into the right pelvic sidewall and right inguinal region compatible with retroperitoneal and extraperitoneal hematoma of the abdomen and pelvis. Hematocrit arrival is identified within the right psoas muscle hematoma, image number 74 of series 3. Hematoma including the psoas muscle measures 6.5 x 5.2 x 14.8 cm. Musculoskeletal: Mild degenerative disc disease identified within the lumbar spine. IMPRESSION: 1. A right retroperitoneal hematoma is suspected extending into the right side of pelvis and right groin region. A hematocrit level is identified within the asymmetrically enlarged right psoas muscle hematoma. 2. The appendix is normal. 3.  Aortic Atherosclerosis (ICD10-I70.0).  4. Gallbladder hydrops. Etiology and clinical significance indeterminate. This could be better assessed with right upper quadrant sonogram. 5. Bilateral pleural effusions with diminished aeration to the lung bases. 6. Critical Value/emergent results were called by telephone at the time of interpretation on 03/24/2017 at 5:59 pm to Dr. Dia Crawford , who verbally acknowledged these  results. Electronically Signed   By: Kerby Moors M.D.   On: 03/24/2017 17:59   Dg Chest 1 View  Result Date: 03/24/2017 CLINICAL DATA:  Post thoracentesis EXAM: CHEST 1 VIEW COMPARISON:  03/23/2017, 03/22/2017 FINDINGS: Right-sided catheter sheath remains in place with kink at the supraclavicular region. The tip projects over the venous confluence. Decreased right-sided pleural effusion with improved aeration of the right lung base. Persistent small left effusion with dense consolidation at the left lung base. Hazy atelectasis or infiltrate at the right lung base. No pneumothorax is seen. Stable cardiomegaly. IMPRESSION: 1. Decreased right pleural effusion post thoracentesis. No definitive pneumothorax seen 2. Continued left pleural effusion with dense left lower lobe atelectasis or pneumonia. Hazy atelectasis or infiltrate at the right base 3. Cardiomegaly unchanged Electronically Signed   By: Donavan Foil M.D.   On: 03/24/2017 16:29   Dg Chest Port 1 View  Result Date: 03/26/2017 CLINICAL DATA:  Pneumonia EXAM: PORTABLE CHEST 1 VIEW COMPARISON:  03/24/2017 FINDINGS: Cardiomegaly. Right lung is clear. Continued left lower lobe airspace opacity and probable small left effusion. Aeration is slightly improved in the left base. Right internal jugular vascular sheath again noted, unchanged. IMPRESSION: Continued left pleural effusion with left lower lobe atelectasis or infiltrate, both improved since prior study. No confluent opacity on the right. Stable cardiomegaly. Electronically Signed   By: Rolm Baptise M.D.   On: 03/26/2017 10:37   Ir Nephrostomy Placement Right  Result Date: 03/27/2017 INDICATION: Right-sided hydronephrosis secondary to ureteral obstruction. EXAM: IR NEPHROSTOMY PLACEMENT RIGHT COMPARISON:  CT of the abdomen on 03/26/2017 MEDICATIONS: Ciprofloxacin 400 mg IV; The antibiotic was administered in an appropriate time frame prior to skin puncture. ANESTHESIA/SEDATION: Fentanyl 100 mcg  IV; Versed 4.0 mg IV Moderate Sedation Time:  10 minutes. The patient was continuously monitored during the procedure by the interventional radiology nurse under my direct supervision. CONTRAST:  10 mL Isovue-300 - administered into the collecting system(s) FLUOROSCOPY TIME:  Fluoroscopy Time: 1 minutes and 30 seconds (4.1 mGy). COMPLICATIONS: None immediate. PROCEDURE: Informed written consent was obtained from the patient after a thorough discussion of the procedural risks, benefits and alternatives. All questions were addressed. Maximal Sterile Barrier Technique was utilized including caps, mask, sterile gowns, sterile gloves, sterile drape, hand hygiene and skin antiseptic. A timeout was performed prior to the initiation of the procedure. Ultrasound was used to localize the right kidney. Under direct ultrasound guidance, a 21 gauge needle was advanced into the collecting system. After aspiration of urine, contrast was injected. A guidewire was advanced into the collecting system. A transitional dilator was placed over the wire. The percutaneous tract was dilated over the wire and a 10 French percutaneous nephrostomy tube advanced. This was formed in the collecting system and a fluoroscopic spot image obtained after injection of contrast material. The catheter was flushed and connected to a gravity drainage bag. It was secured at the skin with a Prolene retention suture and StatLock device. FINDINGS: By ultrasound and also later with contrast injection, hydronephrosis of the right kidney does appear to be improved. However, given renal failure progressive hyperkalemia, there was felt to be indication to proceed with nephrostomy  tube placement. The tube was placed via interpolar access with the catheter formed in the renal pelvis. IMPRESSION: Right-sided percutaneous nephrostomy tube placement with placement of a 10 French catheter formed at the level of the renal pelvis. The catheter was attached to gravity bag  drainage. Electronically Signed   By: Aletta Edouard M.D.   On: 03/27/2017 16:21   Ir Thoracentesis Asp Pleural Space W/img Guide  Result Date: 03/24/2017 INDICATION: History of right-sided pleural effusion noted on x-ray. Request is made for diagnostic and therapeutic thoracentesis. EXAM: ULTRASOUND GUIDED DIAGNOSTIC AND THERAPEUTIC THORACENTESIS MEDICATIONS: 1% lidocaine COMPLICATIONS: None immediate. PROCEDURE: An ultrasound guided thoracentesis was thoroughly discussed with the patient and questions answered. The benefits, risks, alternatives and complications were also discussed. The patient understands and wishes to proceed with the procedure. Written consent was obtained. Ultrasound was performed to localize and mark an adequate pocket of fluid in the right chest. The area was then prepped and draped in the normal sterile fashion. 1% Lidocaine was used for local anesthesia. Under ultrasound guidance a Safe-T-Centesis catheter was introduced. Thoracentesis was performed. The catheter was removed and a dressing applied. FINDINGS: A total of approximately 0.35 L of serous fluid was removed. Samples were sent to the laboratory as requested by the clinical team. IMPRESSION: Successful ultrasound guided right thoracentesis yielding 0.35 L of pleural fluid. Read by: Saverio Danker, PA-C Electronically Signed   By: Aletta Edouard M.D.   On: 03/24/2017 16:38    Labs:  CBC:  Recent Labs  03/26/17 0424 03/26/17 0805 03/27/17 0338 03/27/17 1810 03/28/17 0548  WBC 49.8* 51.4* 42.0*  --  27.8*  HGB 8.5* 8.1* 7.1* 7.4* 7.4*  HCT 24.4* 23.0* 20.4* 21.7* 22.2*  PLT 191 170 141*  --  146*    COAGS:  Recent Labs  03/17/17 0439 03/24/17 0753 03/26/17 1132 03/26/17 2147  INR 1.53 1.16 1.33 1.34  APTT  --   --  63*  --     BMP:  Recent Labs  03/26/17 1500 03/26/17 2147 03/27/17 0338 03/27/17 1823 03/28/17 0548  NA 130*  --  133* 130* 134*  K 5.9* 5.7* 6.1* 5.2* 5.1  CL 94*  --  97*  97* 99*  CO2 26  --  23 23 21*  GLUCOSE 165*  --  176* 146* 145*  BUN 36*  --  40* 50* 53*  CALCIUM 9.3  --  9.3 8.8* 8.9  CREATININE 3.61*  --  4.24* 4.65* 4.92*  GFRNONAA 12*  --  10* 9* 8*  GFRAA 14*  --  12* 10* 10*    LIVER FUNCTION TESTS:  Recent Labs  03/16/17 1403  03/21/17 0444 03/25/17 0433 03/26/17 0953 03/27/17 1823 03/28/17 0548  BILITOT 1.4*  --  1.3* 1.1 1.0  --   --   AST 395*  --  162* 46* 39  --   --   ALT 440*  --  677* 193* 121*  --   --   ALKPHOS 81  --  85 120 109  --   --   PROT 5.4*  --  6.0* 5.6* 5.8*  --   --   ALBUMIN 2.9*  < > 3.2* 3.1* 2.7* 2.5* 2.6*  < > = values in this interval not displayed.  Assessment and Plan: 1. Right hydronephrosis secondary to obstructed right ureter, s/p PCN placement  Cont with drain for now while she has obstruction. Irrigate drain while here in the hospital Further plans regarding HD etc per  nephrology. Will follow  Electronically Signed: Lenox Ladouceur E 03/28/2017, 11:57 AM   I spent a total of 15 Minutes at the the patient's bedside AND on the patient's hospital floor or unit, greater than 50% of which was counseling/coordinating care for right hydronephrosis

## 2017-03-28 NOTE — Progress Notes (Signed)
Jocelyn Hill  WYO:[id number] DOB: [birth year] DOA: 03/12/2017 PCP: [name], MD    Brief Narrative:  66yo F w/ a hx of anxiety, depression, CVA, poorly controlled HTN, CKD stage III, DM2 with peripheral neuropathy, and Hypothyroidism who presented w/ a month of gradually worsening dyspnea.  She initially presented to the ED at [facility] where she was found to have a BNP of 1700.  CXR noted moderate bilateral pleural effusions. Because of worsening creatinine and decompensated CHF, it was felt best to transfer her to [facility].   Subjective: The pt c/o aching pain all over.  She denies focal pain, sob, n/v, or HA.  She is alert and conversant.    Assessment & Plan:  Acute Respiratory failure with Hypoxia due to pulm edema and B Pleural effusions  R thoracentesis 9/25 yielded ~350cc of fluid (exudate per LDH, but transudate per protein) - continues to require oxygen support but sats good on HFNC - attempt to wean as able   Large Right retroperitoneal hematoma Noted via CT abdom 9/25 - was transfused 1U PRBC 9/28 - Hgb neutral despite transfusion - follow in serial fashion   Acute renal failure on CKD stage III - R obstructive hydronephrosis  Care as per Nephrology - baseline crt ~2.1 - acute failure due to obstructive hydronephrosis related to [facility] - status post percutaneous nephrostomy tube 9/28  Recent Labs Lab 03/26/17 0953 03/26/17 1500 03/27/17 0338 03/27/17 1823 03/28/17 0548  CREATININE 3.62* 3.61* 4.24* 4.65* 4.92*    E coli and Kleb pneumo pyelonephritis w/ E coli bacteremia In setting of acute obstructive hydronephrosis - cont rocephin - f/u sensitivities of cultures   Acute Diastolic CHF CHF Team addressing this issue - ongoing diuresis w/ IV lasix   Severe Mitral Valve regurgitation TCTS following, and they have placed Dental consult - for eventual valve surgery once medically stable otherwise  Acute encephalopathy   Multifactorial - improving slowly - attempt to decrease dose of pain meds   CAD - Elevated troponin / demand ischemia Cards following - to have cardiac cath when renal fxn allows   Parox Afib/flutter To consider Maze w/ MVR - Cards following - NSR on amiodarone presently with dose being decreased today due to junctional rhythm - unable to anticoag at this time due to [facility]  Acute blood loss anemia in setting of Chronic anemia due to CKD Transfuse as needed with goal of hemoglobin 7.0 or greater - s/p 1U PRBC thus far   Recent Labs Lab 03/26/17 0424 03/26/17 0805 03/27/17 0338 03/27/17 1810 03/28/17 0548  HGB 8.5* 8.1* 7.1* 7.4* 7.4*     DM2 9/13 A1c 6.2 - CBG controlled presently   Hypothyroidism Cont home synthroid dose   Hyperkalemia  Due to acute renal failure - improving post perc-neph   Transamintis Likely hepatic congestion in setting of CHF - acute hepatitis panel negative - LFTs improving    DVT prophylaxis: SCDs Code Status: FULL CODE Family Communication: spoke at length w/ sister at bedside   Disposition Plan: SDU   Consultants:  CHF Team  Cardiology  TCTS PCCM  Procedures: 9/13 TTE - EF 60-65% - grade 2 DD - severe MR - mildly dilated LA and RA 9/18 TEE - severe regurgitation MV 9/28 R perc neph tube   Antimicrobials:  Azithromycin 9/15 > 9/17 Zosyn 9/27 Cipro 9/28  Rocephin 9/28 >  Objective: Blood pressure 139/69, pulse (!) 59, temperature 97.9 F (36.6 C),  temperature source Axillary, resp. rate (!) 21, height 5\' 9"  (1.753 m), weight 60.9 kg (134 lb 4.2 oz), SpO2 100 %.  Intake/Output Summary (Last 24 hours) at 03/28/17 1432 Last data filed at 03/28/17 1300  Gross per 24 hour  Intake              330 ml  Output             1000 ml  Net             -670 ml   Filed Weights   03/26/17 0500 03/27/17 0314 03/28/17 0316  Weight: 59 kg (130 lb 1.1 oz) 60.8 kg (134 lb 0.6 oz) 60.9 kg (134 lb 4.2 oz)    Examination: General: No acute  respiratory distress - more alert - appears uncomfortable   Lungs: no wheezing or focal crackles B - good air movement th/o  Cardiovascular: RRR  Abdomen: Nondistended, bowel sounds positive, no mass, no rebound  Extremities: No edema B LE  CBC:  Recent Labs Lab 03/22/17 0914  03/25/17 0433  03/26/17 0424 03/26/17 0805 03/27/17 0338 03/27/17 1810 03/28/17 0548  WBC 10.6*  < > 16.2*  --  49.8* 51.4* 42.0*  --  27.8*  NEUTROABS 8.8*  --   --   --   --   --   --   --   --   HGB 9.5*  < > 9.5*  < > 8.5* 8.1* 7.1* 7.4* 7.4*  HCT 28.7*  < > 28.2*  < > 24.4* 23.0* 20.4* 21.7* 22.2*  MCV 75.7*  < > 75.8*  --  76.5* 75.9* 75.6*  --  76.0*  PLT 222  < > 229  --  191 170 141*  --  146*  < > = values in this interval not displayed. Basic Metabolic Panel:  Recent Labs Lab 03/24/17 0434 03/24/17 1749  03/26/17 0953 03/26/17 1500 03/26/17 2147 03/27/17 0338 03/27/17 1823 03/28/17 0548  NA 135  --   < > 131* 130*  --  133* 130* 134*  K 3.2* 4.1  < > 5.9* 5.9* 5.7* 6.1* 5.2* 5.1  CL 93*  --   < > 93* 94*  --  97* 97* 99*  CO2 31  --   < > 25 26  --  23 23 21*  GLUCOSE 138*  --   < > 144* 165*  --  176* 146* 145*  BUN 30*  --   < > 33* 36*  --  40* 50* 53*  CREATININE 2.73*  --   < > 3.62* 3.61*  --  4.24* 4.65* 4.92*  CALCIUM 9.0  --   < > 9.7 9.3  --  9.3 8.8* 8.9  MG 1.9 1.8  --  1.8 1.8 1.6*  --   --   --   PHOS  --   --   --   --   --   --   --  5.4* 5.0*  < > = values in this interval not displayed. GFR: Estimated Creatinine Clearance: 11 mL/min (A) (by C-G formula based on SCr of 4.92 mg/dL (H)).  Liver Function Tests:  Recent Labs Lab 03/25/17 0433 03/26/17 0953 03/27/17 1823 03/28/17 0548  AST 46* 39  --   --   ALT 193* 121*  --   --   ALKPHOS 120 109  --   --   BILITOT 1.1 1.0  --   --   PROT 5.6* 5.8*  --   --  ALBUMIN 3.1* 2.7* 2.5* 2.6*    Coagulation Profile:  Recent Labs Lab 03/24/17 0753 03/26/17 1132 03/26/17 2147  INR 1.16 1.33 1.34     HbA1C: Hgb A1c MFr Bld  Date/Time Value Ref Range Status  03/12/2017 02:00 PM 6.2 (H) 4.8 - 5.6 % Final    Comment:    (NOTE) Pre diabetes:          5.7%-6.4% Diabetes:              >6.4% Glycemic control for   <7.0% adults with diabetes     CBG:  Recent Labs Lab 03/27/17 1127 03/27/17 1622 03/27/17 2201 03/28/17 0714 03/28/17 1226  GLUCAP 119* 211* 96 162* 130*    Recent Results (from the past 240 hour(s))  Gram stain     Status: None   Collection Time: 03/24/17  4:23 PM  Result Value Ref Range Status   Specimen Description FLUID PLEURAL RIGHT  Final   Special Requests NONE  Final   Gram Stain   Final    WBC PRESENT, PREDOMINANTLY MONONUCLEAR NO ORGANISMS SEEN CYTOSPIN SMEAR    Report Status 03/24/2017 FINAL  Final  Fungus Culture With Stain     Status: None (Preliminary result)   Collection Time: 03/24/17  4:23 PM  Result Value Ref Range Status   Fungus Stain Final report  Final    Comment: (NOTE) Performed At: [facility] [place], [place] [id number] Lindon Romp MD IW:[id number]    Fungus (Mycology) Culture PENDING  Incomplete   Fungal Source FLUID  Final    Comment: PLEURAL RIGHT   Acid Fast Smear (AFB)     Status: None   Collection Time: 03/24/17  4:23 PM  Result Value Ref Range Status   AFB Specimen Processing Concentration  Final   Acid Fast Smear Negative  Final    Comment: (NOTE) Performed At: [facility] [address] [place], [place] [id number] Lindon Romp MD AL:[id number]    Source (AFB) FLUID  Final    Comment: PLEURAL RIGHT   Culture, body fluid-bottle     Status: None (Preliminary result)   Collection Time: 03/24/17  4:23 PM  Result Value Ref Range Status   Specimen Description FLUID PLEURAL RIGHT  Final   Special Requests BOTTLES DRAWN AEROBIC AND ANAEROBIC  Final   Culture NO GROWTH 3 DAYS  Final   Report Status PENDING  Incomplete  Fungus Culture Result     Status: None    Collection Time: 03/24/17  4:23 PM  Result Value Ref Range Status   Result 1 Comment  Final    Comment: (NOTE) KOH/Calcofluor preparation:  no fungus observed. Performed At: [facility] [place], [place] [id number] Lindon Romp MD AS:[id number]   Urine Culture     Status: Abnormal (Preliminary result)   Collection Time: 03/26/17  8:07 AM  Result Value Ref Range Status   Specimen Description URINE, CATHETERIZED  Final   Special Requests NONE  Final   Culture (A)  Final    >=100,000 COLONIES/mL KLEBSIELLA PNEUMONIAE >=100,000 COLONIES/mL ESCHERICHIA COLI    Report Status PENDING  Incomplete  Culture, blood (routine x 2)     Status: None (Preliminary result)   Collection Time: 03/26/17 10:40 AM  Result Value Ref Range Status   Specimen Description BLOOD LEFT HAND  Final   Special Requests IN PEDIATRIC BOTTLE Blood Culture adequate volume  Final   Culture NO GROWTH 1 DAY  Final  Report Status PENDING  Incomplete  Culture, blood (routine x 2)     Status: Abnormal (Preliminary result)   Collection Time: 03/26/17 10:40 AM  Result Value Ref Range Status   Specimen Description BLOOD RIGHT HAND  Final   Special Requests IN PEDIATRIC BOTTLE Blood Culture adequate volume  Final   Culture  Setup Time   Final    GRAM NEGATIVE RODS IN PEDIATRIC BOTTLE CRITICAL RESULT CALLED TO, READ BACK BY AND VERIFIED WITH: G.ABBOTT PHARMD 03/27/17 0548 L.CHAMPION    Culture ESCHERICHIA COLI SUSCEPTIBILITIES TO FOLLOW  (A)  Final   Report Status PENDING  Incomplete  Blood Culture ID Panel (Reflexed)     Status: Abnormal   Collection Time: 03/26/17 10:40 AM  Result Value Ref Range Status   Enterococcus species NOT DETECTED NOT DETECTED Final   Listeria monocytogenes NOT DETECTED NOT DETECTED Final   Staphylococcus species NOT DETECTED NOT DETECTED Final   Staphylococcus aureus NOT DETECTED NOT DETECTED Final   Streptococcus species NOT DETECTED NOT DETECTED Final    Streptococcus agalactiae NOT DETECTED NOT DETECTED Final   Streptococcus pneumoniae NOT DETECTED NOT DETECTED Final   Streptococcus pyogenes NOT DETECTED NOT DETECTED Final   Acinetobacter baumannii NOT DETECTED NOT DETECTED Final   Enterobacteriaceae species DETECTED (A) NOT DETECTED Final    Comment: Enterobacteriaceae represent a large family of gram-negative bacteria, not a single organism. CRITICAL RESULT CALLED TO, READ BACK BY AND VERIFIED WITH: G.ABBOTT PHARMD 03/27/17 0548 L.CHAMPION    Enterobacter cloacae complex NOT DETECTED NOT DETECTED Final   Escherichia coli DETECTED (A) NOT DETECTED Final    Comment: CRITICAL RESULT CALLED TO, READ BACK BY AND VERIFIED WITH: G.ABBOTT PHARMD 03/27/17 0548 L.CHAMPION    Klebsiella oxytoca NOT DETECTED NOT DETECTED Final   Klebsiella pneumoniae NOT DETECTED NOT DETECTED Final   Proteus species NOT DETECTED NOT DETECTED Final   Serratia marcescens NOT DETECTED NOT DETECTED Final   Carbapenem resistance NOT DETECTED NOT DETECTED Final   Haemophilus influenzae NOT DETECTED NOT DETECTED Final   Neisseria meningitidis NOT DETECTED NOT DETECTED Final   Pseudomonas aeruginosa NOT DETECTED NOT DETECTED Final   Candida albicans NOT DETECTED NOT DETECTED Final   Candida glabrata NOT DETECTED NOT DETECTED Final   Candida krusei NOT DETECTED NOT DETECTED Final   Candida parapsilosis NOT DETECTED NOT DETECTED Final   Candida tropicalis NOT DETECTED NOT DETECTED Final  Culture, Urine     Status: Abnormal   Collection Time: 03/26/17  3:38 PM  Result Value Ref Range Status   Specimen Description URINE, RANDOM  Final   Special Requests NONE  Final   Culture MULTIPLE SPECIES PRESENT, SUGGEST RECOLLECTION (A)  Final   Report Status 03/27/2017 FINAL  Final     Scheduled Meds: . amiodarone  100 mg Oral Daily  . amLODipine  5 mg Oral Daily  . aspirin  81 mg Oral Daily  . docusate sodium  100 mg Oral BID  . furosemide  40 mg Intravenous Q12H  .  hydrALAZINE  75 mg Oral Q8H  . insulin aspart  0-15 Units Subcutaneous TID WC  . insulin glargine  5 Units Subcutaneous Daily  . levothyroxine  112 mcg Oral QAC breakfast  . mouth rinse  15 mL Mouth Rinse BID  . pantoprazole  40 mg Oral Daily  . patiromer  8.4 g Oral Daily  . sodium chloride flush  10-40 mL Intracatheter Q12H  . vitamin C  250 mg Oral BID  LOS: 16 days   Cherene Altes, MD [facility] Office  [phone] Pager - Text Page per Amion as per below:  On-Call/Text Page:      [email or web address]      password [email or web address]  If 7PM-7AM, please contact night-coverage [email or web address] Password [email or web address] 03/28/2017, 2:32 PM

## 2017-03-28 NOTE — Progress Notes (Signed)
[facility] Progress Note  Subjective: s/p neph tube- UOP inc some- 875 yest- 75 so far today -  crt worse slightly  - c/o abd pain, more confused WBC down to 28K,  K better as well - seems more comfortable but medicated this AM   Vitals:   03/28/17 0832 03/28/17 0900 03/28/17 1000 03/28/17 1005  BP:  (!) 147/79 (!) 150/70 (!) 150/70  Pulse:      Resp:  (!) 27 (!) 28   Temp:      TempSrc:      SpO2: 93%     Weight:      Height:        Inpatient medications: . amiodarone  100 mg Oral Daily  . amLODipine  5 mg Oral Daily  . aspirin  81 mg Oral Daily  . docusate sodium  100 mg Oral BID  . hydrALAZINE  75 mg Oral Q8H  . insulin aspart  0-15 Units Subcutaneous TID WC  . insulin glargine  5 Units Subcutaneous Daily  . levothyroxine  112 mcg Oral QAC breakfast  . mouth rinse  15 mL Mouth Rinse BID  . pantoprazole  40 mg Oral Daily  . patiromer  8.4 g Oral Daily  . sodium chloride flush  10-40 mL Intracatheter Q12H  . vitamin C  250 mg Oral BID   . cefTRIAXone (ROCEPHIN)  IV Stopped (03/27/17 2243)   acetaminophen **OR** acetaminophen, bisacodyl, fentaNYL (SUBLIMAZE) injection, hydrALAZINE, levalbuterol, lidocaine (PF), LORazepam, ondansetron (ZOFRAN) IV, sennosides, sodium chloride flush  Exam: On nasal O2, not in distress Chest rales R base, L clear RRR no RG, 3/6 sem Abd soft ntnd no ascites Ext no LE edema NF, follows commands   Impression: 1.  Acute on CKD 4 - worse today again ( baseline- reported around 2.1)- previous worse vol status w/ pulm edema.  In need of heart cath and valve replacement; with CKD stage IV bordering stage V needed higher doses of lasix  to stabilize her first.   responded.  Family and pt are agreeable to dialysis if needed. More recent  trend in creatinine, imaging thursday showed the retroperitoneal hematoma causing obstruction of her solitary kidney, urology called s/p nephrostomy tube- is putting out urine, hope to see renal  function trend back better now that obstruction is relieved  2.  Pulm edema - resolved- but now with oliguria a little worse 3.  Severe MR - per cards- sounds like cardiac cath and surgical intervention way down the line- needs rehab 4.  HTN - bp's high, vol excess- also on norvasc 10 and hydralazine 75 q 8 (increased)  - lasix stopped but I will restart 5.  Hx CVA/ depression/ chronic pain 6. Anemia- giving iron- RPB- hgb drop- heparin stopped - cont to follow for transfusion need 7. Hypokalemia- had been repleting- stop scheduled ,  now needs veltassa to bring down- nephrostomy tube to correct - is better  8. Elevated WBC- unsure etiology- cultures ordered, now on zosyn - is improving      Rahkim Rabalais A  03/28/2017, 10:25 AM    Recent Labs Lab 03/27/17 0338 03/27/17 1823 03/28/17 0548  NA 133* 130* 134*  K 6.1* 5.2* 5.1  CL 97* 97* 99*  CO2 23 23 21*  GLUCOSE 176* 146* 145*  BUN 40* 50* 53*  CREATININE 4.24* 4.65* 4.92*  CALCIUM 9.3 8.8* 8.9  PHOS  --  5.4* 5.0*    Recent Labs Lab 03/25/17 0433 03/26/17 0953 03/27/17 1823  03/28/17 0548  AST 46* 39  --   --   ALT 193* 121*  --   --   ALKPHOS 120 109  --   --   BILITOT 1.1 1.0  --   --   PROT 5.6* 5.8*  --   --   ALBUMIN 3.1* 2.7* 2.5* 2.6*    Recent Labs Lab 03/22/17 0914  03/26/17 0805 03/27/17 0338 03/27/17 1810 03/28/17 0548  WBC 10.6*  < > 51.4* 42.0*  --  27.8*  NEUTROABS 8.8*  --   --   --   --   --   HGB 9.5*  < > 8.1* 7.1* 7.4* 7.4*  HCT 28.7*  < > 23.0* 20.4* 21.7* 22.2*  MCV 75.7*  < > 75.9* 75.6*  --  76.0*  PLT 222  < > 170 141*  --  146*  < > = values in this interval not displayed. Iron/TIBC/Ferritin/ %Sat    Component Value Date/Time   IRON 62 03/20/2017 0406   TIBC 256 03/20/2017 0406   FERRITIN 148 03/20/2017 0406   IRONPCTSAT 24 03/20/2017 0406

## 2017-03-29 ENCOUNTER — Inpatient Hospital Stay (HOSPITAL_COMMUNITY): Payer: Medicare Other

## 2017-03-29 LAB — CBC
HCT: 20.1 % — ABNORMAL LOW (ref 36.0–46.0)
HCT: 23.2 % — ABNORMAL LOW (ref 36.0–46.0)
Hemoglobin: 6.8 g/dL — CL (ref 12.0–15.0)
Hemoglobin: 7.8 g/dL — ABNORMAL LOW (ref 12.0–15.0)
MCH: 26 pg (ref 26.0–34.0)
MCH: 26.3 pg (ref 26.0–34.0)
MCHC: 33.8 g/dL (ref 30.0–36.0)
MCHC: 33.6 g/dL (ref 30.0–36.0)
MCV: 76.7 fL — ABNORMAL LOW (ref 78.0–100.0)
MCV: 78.1 fL (ref 78.0–100.0)
Platelets: 145 10*3/uL — ABNORMAL LOW (ref 150–400)
Platelets: 149 10*3/uL — ABNORMAL LOW (ref 150–400)
RBC: 2.62 MIL/uL — ABNORMAL LOW (ref 3.87–5.11)
RBC: 2.97 MIL/uL — ABNORMAL LOW (ref 3.87–5.11)
RDW: 16.2 % — ABNORMAL HIGH (ref 11.5–15.5)
RDW: 16 % — ABNORMAL HIGH (ref 11.5–15.5)
WBC: 15.4 10*3/uL — ABNORMAL HIGH (ref 4.0–10.5)
WBC: 13.4 10*3/uL — ABNORMAL HIGH (ref 4.0–10.5)

## 2017-03-29 LAB — CULTURE, BLOOD (ROUTINE X 2): Special Requests: ADEQUATE

## 2017-03-29 LAB — RENAL FUNCTION PANEL
Albumin: 2.5 g/dL — ABNORMAL LOW (ref 3.5–5.0)
Anion gap: 12 (ref 5–15)
BUN: 52 mg/dL — ABNORMAL HIGH (ref 6–20)
CO2: 22 mmol/L (ref 22–32)
Calcium: 8.2 mg/dL — ABNORMAL LOW (ref 8.9–10.3)
Chloride: 98 mmol/L — ABNORMAL LOW (ref 101–111)
Creatinine, Ser: 4.64 mg/dL — ABNORMAL HIGH (ref 0.44–1.00)
GFR calc Af Amer: 10 mL/min — ABNORMAL LOW (ref >60)
GFR calc non Af Amer: 9 mL/min — ABNORMAL LOW (ref >60)
Glucose, Bld: 186 mg/dL — ABNORMAL HIGH (ref 65–99)
Phosphorus: 4.2 mg/dL (ref 2.5–4.6)
Potassium: 3.6 mmol/L (ref 3.5–5.1)
Sodium: 132 mmol/L — ABNORMAL LOW (ref 135–145)

## 2017-03-29 LAB — COMPREHENSIVE METABOLIC PANEL
ALT: 90 U/L — ABNORMAL HIGH (ref 14–54)
AST: 48 U/L — ABNORMAL HIGH (ref 15–41)
Albumin: 2.7 g/dL — ABNORMAL LOW (ref 3.5–5.0)
Alkaline Phosphatase: 129 U/L — ABNORMAL HIGH (ref 38–126)
Anion gap: 12 (ref 5–15)
BUN: 51 mg/dL — ABNORMAL HIGH (ref 6–20)
CO2: 21 mmol/L — ABNORMAL LOW (ref 22–32)
Calcium: 8.2 mg/dL — ABNORMAL LOW (ref 8.9–10.3)
Chloride: 94 mmol/L — ABNORMAL LOW (ref 101–111)
Creatinine, Ser: 5 mg/dL — ABNORMAL HIGH (ref 0.44–1.00)
GFR calc Af Amer: 10 mL/min — ABNORMAL LOW (ref >60)
GFR calc non Af Amer: 8 mL/min — ABNORMAL LOW (ref >60)
Glucose, Bld: 147 mg/dL — ABNORMAL HIGH (ref 65–99)
Potassium: 4.1 mmol/L (ref 3.5–5.1)
Sodium: 127 mmol/L — ABNORMAL LOW (ref 135–145)
Total Bilirubin: 1.4 mg/dL — ABNORMAL HIGH (ref 0.3–1.2)
Total Protein: 5.7 g/dL — ABNORMAL LOW (ref 6.5–8.1)

## 2017-03-29 LAB — PREPARE RBC (CROSSMATCH)

## 2017-03-29 LAB — URINE CULTURE: Culture: >=100000 — AB

## 2017-03-29 LAB — CULTURE, BODY FLUID-BOTTLE: Culture: NO GROWTH

## 2017-03-29 LAB — GLUCOSE, CAPILLARY
Glucose-Capillary: 146 mg/dL — ABNORMAL HIGH (ref 65–99)
Glucose-Capillary: 166 mg/dL — ABNORMAL HIGH (ref 65–99)
Glucose-Capillary: 131 mg/dL — ABNORMAL HIGH (ref 65–99)
Glucose-Capillary: 138 mg/dL — ABNORMAL HIGH (ref 65–99)

## 2017-03-29 LAB — MAGNESIUM: Magnesium: 1.8 mg/dL (ref 1.7–2.4)

## 2017-03-29 LAB — CULTURE, BODY FLUID W GRAM STAIN -BOTTLE

## 2017-03-29 MED ORDER — POLYETHYLENE GLYCOL 3350 17 G PO PACK
17.0000 g | PACK | Freq: Two times a day (BID) | ORAL | Status: DC
  Administered 2017-03-29 – 2017-04-02 (×6): 17 g via ORAL
  Filled 2017-03-29 (×6): qty 1

## 2017-03-29 MED ORDER — LEVALBUTEROL HCL 1.25 MG/0.5ML IN NEBU: 1.2500 mg | INHALATION_SOLUTION | RESPIRATORY_TRACT | Status: DC | PRN

## 2017-03-29 MED ORDER — SODIUM CHLORIDE 0.9 % IV SOLN
Freq: Once | INTRAVENOUS | Status: AC
  Administered 2017-03-29: 15:00:00 via INTRAVENOUS

## 2017-03-29 MED ORDER — SENNOSIDES-DOCUSATE SODIUM 8.6-50 MG PO TABS
1.0000 | ORAL_TABLET | Freq: Two times a day (BID) | ORAL | Status: DC
  Administered 2017-03-29 – 2017-04-04 (×10): 1 via ORAL
  Filled 2017-03-29 (×10): qty 1

## 2017-03-29 MED ORDER — LORAZEPAM 2 MG/ML IJ SOLN
1.0000 mg | INTRAMUSCULAR | Status: DC | PRN
  Administered 2017-03-29 – 2017-03-31 (×8): 2 mg via INTRAVENOUS
  Filled 2017-03-29 (×8): qty 1

## 2017-03-29 MED ORDER — QUETIAPINE FUMARATE 25 MG PO TABS
12.5000 mg | ORAL_TABLET | Freq: Every day | ORAL | Status: DC
  Administered 2017-03-29 – 2017-03-30 (×2): 12.5 mg via ORAL
  Filled 2017-03-29 (×2): qty 1

## 2017-03-29 NOTE — Progress Notes (Signed)
Jocelyn Hill  ENI:[id number] DOB: 1951/05/19 DOA: 03/12/2017 PCP: [name], MD    Brief Narrative:  66yo F w/ a hx of anxiety, depression, CVA, poorly controlled HTN, CKD stage III, DM2 with peripheral neuropathy, and Hypothyroidism who presented w/ a month of gradually worsening dyspnea.  She initially presented to the ED at [facility] where she was found to have a BNP of 1700.  CXR noted moderate bilateral pleural effusions. Because of worsening creatinine and decompensated CHF, it was felt best to transfer her to [facility].   Subjective: The pt has been quite agitated over the last 12hrs.  She has been screaming out frequently and c/o uncontrolled pain.  At the time of my visit she is much more calm, and in no acute resp distress.    Assessment & Plan:  Acute Respiratory failure with Hypoxia due to pulm edema and B Pleural effusions  R thoracentesis 9/25 yielded ~350cc of fluid (exudate per LDH, but transudate per protein) - continues to require oxygen support but sats good on HFNC - attempt to wean as able - f/u CXR today suggests increased pulm edema but clinically pt is improving w/ decreased O2 requirement   Large Right retroperitoneal hematoma - acute blood loss anemia in setting of chronic anemia due to CKD Noted via CT abdom 9/25 - was transfused 1U PRBC 9/28 - Hgb falling - transfuse further today - hematoma stable on f/u CT 9/27 - if continues to fall and pain persists may require yet another f/u CT   Recent Labs Lab 03/26/17 0424 03/26/17 0805 03/27/17 0338 03/27/17 1810 03/28/17 0548 03/29/17 1156  HGB 8.5* 8.1* 7.1* 7.4* 7.4* 6.8*    Acute renal failure on CKD stage III - R obstructive hydronephrosis  Care as per Nephrology - baseline crt ~2.1 - acute failure due to obstructive hydronephrosis related to [facility] - status post percutaneous nephrostomy tube 9/28 - crt appears to be falling at this time   Recent Labs Lab  03/27/17 0338 03/27/17 1823 03/28/17 0548 03/28/17 2330 03/29/17 1156  CREATININE 4.24* 4.65* 4.92* 5.00* 4.64*    E coli and Kleb pneumo pyelonephritis w/ E coli bacteremia In setting of acute obstructive hydronephrosis - cont rocephin - cont Rocephin to assure E coli and Kleb both adequately covered   Acute Diastolic CHF ongoing diuresis w/ IV lasix - net negative ~18.5 L at this time   Severe Mitral Valve regurgitation TCTS following, and they have placed Dental consult - for eventual valve surgery once medically stable otherwise  Acute encephalopathy  Multifactorial - significantly worsened last night w/ attempts to wean pain meds - begin trial of QHS seroquel and follow   CAD - Elevated troponin / demand ischemia Cards following - to have cardiac cath when renal fxn allows   Parox Afib/flutter To consider Maze w/ MVR - Cards following - NSR on amiodarone presently with dose decreased today due to junctional rhythm - unable to anticoag at this time due to [facility]  DM2 9/13 A1c 6.2 - CBG controlled presently   Hypothyroidism Cont home synthroid dose   Hyperkalemia  Due to acute renal failure - resolved post perc-neph   Transamintis Likely hepatic congestion in setting of CHF - acute hepatitis panel negative - LFTs improving    DVT prophylaxis: SCDs Code Status: FULL CODE Family Communication: no family present at time of exam today   Disposition Plan: SDU   Consultants:  CHF Team  Cardiology  TCTS PCCM  Procedures: 9/13 TTE - EF 60-65% - grade 2 DD - severe MR - mildly dilated LA and RA 9/18 TEE - severe regurgitation MV 9/28 R perc neph tube   Antimicrobials:  Azithromycin 9/15 > 9/17 Zosyn 9/27 Cipro 9/28  Rocephin 9/28 >  Objective: Blood pressure 139/68, pulse (!) 56, temperature 98.3 F (36.8 C), temperature source Oral, resp. rate (!) 22, height 5\' 9"  (1.753 m), weight 58 kg (127 lb 13.9 oz), SpO2 (!) 89 %.  Intake/Output Summary (Last 24  hours) at 03/29/17 1341 Last data filed at 03/29/17 1105  Gross per 24 hour  Intake              758 ml  Output             1820 ml  Net            -1062 ml   Filed Weights   03/27/17 0314 03/28/17 0316 03/29/17 0432  Weight: 60.8 kg (134 lb 0.6 oz) 60.9 kg (134 lb 4.2 oz) 58 kg (127 lb 13.9 oz)    Examination: General: No acute respiratory distress - confused but calm presently  Lungs: no wheezing or focal crackles B  Cardiovascular: RRR w/ 3/6 holosystolic M Abdomen: Nondistended, bowel sounds positive, no mass, no rebound  Extremities: No signif edema B LE  CBC:  Recent Labs Lab 03/26/17 0424 03/26/17 0805 03/27/17 0338 03/27/17 1810 03/28/17 0548 03/29/17 1156  WBC 49.8* 51.4* 42.0*  --  27.8* 15.4*  HGB 8.5* 8.1* 7.1* 7.4* 7.4* 6.8*  HCT 24.4* 23.0* 20.4* 21.7* 22.2* 20.1*  MCV 76.5* 75.9* 75.6*  --  76.0* 76.7*  PLT 191 170 141*  --  146* 579*   Basic Metabolic Panel:  Recent Labs Lab 03/24/17 1749  03/26/17 0953 03/26/17 1500 03/26/17 2147 03/27/17 0338 03/27/17 1823 03/28/17 0548 03/28/17 2330 03/29/17 1156  NA  --   < > 131* 130*  --  133* 130* 134* 127* 132*  K 4.1  < > 5.9* 5.9* 5.7* 6.1* 5.2* 5.1 4.1 3.6  CL  --   < > 93* 94*  --  97* 97* 99* 94* 98*  CO2  --   < > 25 26  --  23 23 21* 21* 22  GLUCOSE  --   < > 144* 165*  --  176* 146* 145* 147* 186*  BUN  --   < > 33* 36*  --  40* 50* 53* 51* 52*  CREATININE  --   < > 3.62* 3.61*  --  4.24* 4.65* 4.92* 5.00* 4.64*  CALCIUM  --   < > 9.7 9.3  --  9.3 8.8* 8.9 8.2* 8.2*  MG 1.8  --  1.8 1.8 1.6*  --   --   --  1.8  --   PHOS  --   --   --   --   --   --  5.4* 5.0*  --  4.2  < > = values in this interval not displayed. GFR: Estimated Creatinine Clearance: 11.1 mL/min (A) (by C-G formula based on SCr of 4.64 mg/dL (H)).  Liver Function Tests:  Recent Labs Lab 03/25/17 0433 03/26/17 0953 03/27/17 1823 03/28/17 0548 03/28/17 2330 03/29/17 1156  AST 46* 39  --   --  48*  --   ALT 193*  121*  --   --  90*  --   ALKPHOS 120 109  --   --  129*  --  BILITOT 1.1 1.0  --   --  1.4*  --   PROT 5.6* 5.8*  --   --  5.7*  --   ALBUMIN 3.1* 2.7* 2.5* 2.6* 2.7* 2.5*    Coagulation Profile:  Recent Labs Lab 03/24/17 0753 03/26/17 1132 03/26/17 2147  INR 1.16 1.33 1.34    HbA1C: Hgb A1c MFr Bld  Date/Time Value Ref Range Status  03/12/2017 02:00 PM 6.2 (H) 4.8 - 5.6 % Final    Comment:    (NOTE) Pre diabetes:          5.7%-6.4% Diabetes:              >6.4% Glycemic control for   <7.0% adults with diabetes     CBG:  Recent Labs Lab 03/28/17 0714 03/28/17 1226 03/28/17 1523 03/28/17 2105 03/29/17 0823  GLUCAP 162* 130* 105* 153* 146*    Recent Results (from the past 240 hour(s))  Gram stain     Status: None   Collection Time: 03/24/17  4:23 PM  Result Value Ref Range Status   Specimen Description FLUID PLEURAL RIGHT  Final   Special Requests NONE  Final   Gram Stain   Final    WBC PRESENT, PREDOMINANTLY MONONUCLEAR NO ORGANISMS SEEN CYTOSPIN SMEAR    Report Status 03/24/2017 FINAL  Final  Fungus Culture With Stain     Status: None (Preliminary result)   Collection Time: 03/24/17  4:23 PM  Result Value Ref Range Status   Fungus Stain Final report  Final    Comment: (NOTE) Performed At: [facility] [place], [place] [id number] Lindon Romp MD [id number]    Fungus (Mycology) Culture PENDING  Incomplete   Fungal Source FLUID  Final    Comment: PLEURAL RIGHT   Acid Fast Smear (AFB)     Status: None   Collection Time: 03/24/17  4:23 PM  Result Value Ref Range Status   AFB Specimen Processing Concentration  Final   Acid Fast Smear Negative  Final    Comment: (NOTE) Performed At: [facility] [address] [place], [place] [id number] Lindon Romp MD FY:[id number]    Source (AFB) FLUID  Final    Comment: PLEURAL RIGHT   Culture, body fluid-bottle     Status: None (Preliminary result)   Collection  Time: 03/24/17  4:23 PM  Result Value Ref Range Status   Specimen Description FLUID PLEURAL RIGHT  Final   Special Requests BOTTLES DRAWN AEROBIC AND ANAEROBIC  Final   Culture NO GROWTH 4 DAYS  Final   Report Status PENDING  Incomplete  Fungus Culture Result     Status: None   Collection Time: 03/24/17  4:23 PM  Result Value Ref Range Status   Result 1 Comment  Final    Comment: (NOTE) KOH/Calcofluor preparation:  no fungus observed. Performed At: [facility] [place], [place] [id number] Lindon Romp MD [id number]   Urine Culture     Status: Abnormal   Collection Time: 03/26/17  8:07 AM  Result Value Ref Range Status   Specimen Description URINE, CATHETERIZED  Final   Special Requests NONE  Final   Culture (A)  Final    >=100,000 COLONIES/mL KLEBSIELLA PNEUMONIAE >=100,000 COLONIES/mL ESCHERICHIA COLI    Report Status 03/29/2017 FINAL  Final   Organism ID, Bacteria KLEBSIELLA PNEUMONIAE (A)  Final   Organism ID, Bacteria ESCHERICHIA COLI (A)  Final      Susceptibility   Escherichia coli -  MIC*    AMPICILLIN 4 SENSITIVE Sensitive     CEFAZOLIN <=4 SENSITIVE Sensitive     CEFTRIAXONE <=1 SENSITIVE Sensitive     CIPROFLOXACIN <=0.25 SENSITIVE Sensitive     GENTAMICIN <=1 SENSITIVE Sensitive     IMIPENEM <=0.25 SENSITIVE Sensitive     NITROFURANTOIN <=16 SENSITIVE Sensitive     TRIMETH/SULFA >=320 RESISTANT Resistant     AMPICILLIN/SULBACTAM <=2 SENSITIVE Sensitive     PIP/TAZO <=4 SENSITIVE Sensitive     Extended ESBL NEGATIVE Sensitive     * >=100,000 COLONIES/mL ESCHERICHIA COLI   Klebsiella pneumoniae - MIC*    AMPICILLIN >=32 RESISTANT Resistant     CEFAZOLIN <=4 SENSITIVE Sensitive     CEFTRIAXONE <=1 SENSITIVE Sensitive     CIPROFLOXACIN <=0.25 SENSITIVE Sensitive     GENTAMICIN <=1 SENSITIVE Sensitive     IMIPENEM <=0.25 SENSITIVE Sensitive     NITROFURANTOIN 64 INTERMEDIATE Intermediate     TRIMETH/SULFA <=20 SENSITIVE Sensitive       AMPICILLIN/SULBACTAM 4 SENSITIVE Sensitive     PIP/TAZO <=4 SENSITIVE Sensitive     Extended ESBL NEGATIVE Sensitive     * >=100,000 COLONIES/mL KLEBSIELLA PNEUMONIAE  Culture, blood (routine x 2)     Status: None (Preliminary result)   Collection Time: 03/26/17 10:40 AM  Result Value Ref Range Status   Specimen Description BLOOD LEFT HAND  Final   Special Requests IN PEDIATRIC BOTTLE Blood Culture adequate volume  Final   Culture NO GROWTH 2 DAYS  Final   Report Status PENDING  Incomplete  Culture, blood (routine x 2)     Status: Abnormal   Collection Time: 03/26/17 10:40 AM  Result Value Ref Range Status   Specimen Description BLOOD RIGHT HAND  Final   Special Requests IN PEDIATRIC BOTTLE Blood Culture adequate volume  Final   Culture  Setup Time   Final    GRAM NEGATIVE RODS IN PEDIATRIC BOTTLE CRITICAL RESULT CALLED TO, READ BACK BY AND VERIFIED WITH: G.ABBOTT PHARMD 03/27/17 0548 L.CHAMPION    Culture ESCHERICHIA COLI (A)  Final   Report Status 03/29/2017 FINAL  Final   Organism ID, Bacteria ESCHERICHIA COLI  Final      Susceptibility   Escherichia coli - MIC*    AMPICILLIN 4 SENSITIVE Sensitive     CEFAZOLIN <=4 SENSITIVE Sensitive     CEFEPIME <=1 SENSITIVE Sensitive     CEFTAZIDIME <=1 SENSITIVE Sensitive     CEFTRIAXONE <=1 SENSITIVE Sensitive     CIPROFLOXACIN <=0.25 SENSITIVE Sensitive     GENTAMICIN <=1 SENSITIVE Sensitive     IMIPENEM <=0.25 SENSITIVE Sensitive     TRIMETH/SULFA >=320 RESISTANT Resistant     AMPICILLIN/SULBACTAM <=2 SENSITIVE Sensitive     PIP/TAZO <=4 SENSITIVE Sensitive     Extended ESBL NEGATIVE Sensitive     * ESCHERICHIA COLI  Blood Culture ID Panel (Reflexed)     Status: Abnormal   Collection Time: 03/26/17 10:40 AM  Result Value Ref Range Status   Enterococcus species NOT DETECTED NOT DETECTED Final   Listeria monocytogenes NOT DETECTED NOT DETECTED Final   Staphylococcus species NOT DETECTED NOT DETECTED Final   Staphylococcus  aureus NOT DETECTED NOT DETECTED Final   Streptococcus species NOT DETECTED NOT DETECTED Final   Streptococcus agalactiae NOT DETECTED NOT DETECTED Final   Streptococcus pneumoniae NOT DETECTED NOT DETECTED Final   Streptococcus pyogenes NOT DETECTED NOT DETECTED Final   Acinetobacter baumannii NOT DETECTED NOT DETECTED Final   Enterobacteriaceae species DETECTED (A) NOT  DETECTED Final    Comment: Enterobacteriaceae represent a large family of gram-negative bacteria, not a single organism. CRITICAL RESULT CALLED TO, READ BACK BY AND VERIFIED WITH: G.ABBOTT PHARMD 03/27/17 0548 L.CHAMPION    Enterobacter cloacae complex NOT DETECTED NOT DETECTED Final   Escherichia coli DETECTED (A) NOT DETECTED Final    Comment: CRITICAL RESULT CALLED TO, READ BACK BY AND VERIFIED WITH: G.ABBOTT PHARMD 03/27/17 0548 L.CHAMPION    Klebsiella oxytoca NOT DETECTED NOT DETECTED Final   Klebsiella pneumoniae NOT DETECTED NOT DETECTED Final   Proteus species NOT DETECTED NOT DETECTED Final   Serratia marcescens NOT DETECTED NOT DETECTED Final   Carbapenem resistance NOT DETECTED NOT DETECTED Final   Haemophilus influenzae NOT DETECTED NOT DETECTED Final   Neisseria meningitidis NOT DETECTED NOT DETECTED Final   Pseudomonas aeruginosa NOT DETECTED NOT DETECTED Final   Candida albicans NOT DETECTED NOT DETECTED Final   Candida glabrata NOT DETECTED NOT DETECTED Final   Candida krusei NOT DETECTED NOT DETECTED Final   Candida parapsilosis NOT DETECTED NOT DETECTED Final   Candida tropicalis NOT DETECTED NOT DETECTED Final  Culture, Urine     Status: Abnormal   Collection Time: 03/26/17  3:38 PM  Result Value Ref Range Status   Specimen Description URINE, RANDOM  Final   Special Requests NONE  Final   Culture MULTIPLE SPECIES PRESENT, SUGGEST RECOLLECTION (A)  Final   Report Status 03/27/2017 FINAL  Final     Scheduled Meds: . amiodarone  100 mg Oral Daily  . amLODipine  5 mg Oral Daily  . aspirin  81 mg  Oral Daily  . furosemide  40 mg Intravenous Q12H  . hydrALAZINE  75 mg Oral Q8H  . insulin aspart  0-15 Units Subcutaneous TID WC  . insulin glargine  5 Units Subcutaneous Daily  . levothyroxine  112 mcg Oral QAC breakfast  . mouth rinse  15 mL Mouth Rinse BID  . pantoprazole  40 mg Oral Daily  . polyethylene glycol  17 g Oral BID  . senna-docusate  1 tablet Oral BID  . sodium chloride flush  10-40 mL Intracatheter Q12H  . vitamin C  250 mg Oral BID     LOS: 17 days   Cherene Altes, MD [facility] Office  [phone] Pager - Text Page per Shea Evans as per below:  On-Call/Text Page:      [email or web address]      password [email or web address]  If 7PM-7AM, please contact night-coverage [email or web address] Password [email or web address] 03/29/2017, 1:41 PM

## 2017-03-29 NOTE — Progress Notes (Signed)
Dr.opyd at bedside. He changed her Fentanyl from 12.5- 25 mcg to 25-80mcg. Dose administered. Will continue to monitor.

## 2017-03-29 NOTE — Progress Notes (Signed)
RN called for pt with ongoing restlessness and moaning. RN made MD Opyd made aware with new orders given and completed by RN. Pt a/o to self, place, and time, vital signs stable RN remains concerned for behavior, stating she want to make sure she's "not missing something" and would like to "rule out everything." RN reports patient has not had a BM since 9/21. MD placed new orders for CXR and to increase frequency of Fentanyl to every hour. I suggested she speak with MD in regards to a KUB if she feels this is abd related. Looking back through patients chart is suggest patient is intermittently confused, agitated, and continuously moans out in pain.

## 2017-03-29 NOTE — Progress Notes (Signed)
Text paged Dr.Opyd, [facility] provider on call d/t patient moaning, restless, thrashing around in bed. Fentanyl 87mcg given IV but no change noted. VS stable, extremities warm. Alert and oriented X 4, but very restless. When asked what was wrong she reported back pain. She keeps calling out "Jocelyn Hill, help me" When I asked her who Jocelyn Hill was she said she doesn't know. She keeps taking her oxygen off. Hand mitts put on to prevent her from taking oxygen off. Provider to come see patient.

## 2017-03-29 NOTE — Progress Notes (Signed)
Text paged Dr.Opyd to inform him no change after Ativan 1mg  IV. He increased the frequency of Fentanyl to every hour. Spoke with rapid response nurse and she suggested a KUB since no BM since 9/21, c/o low back pain, recent nephrostomy tube placement, and recent retroperitoneal bleed. Dr.Opyd order abd x-ray. Waiting for radiology to come take picture.

## 2017-03-29 NOTE — Progress Notes (Signed)
Called regarding patient complaining of pain. She has been calling out, asking for help with pain.   Reviewed chart extensively, examined patient. Appears stable, vitals stable, respirations unlabored. Complaining of ongoing pain without adequate relief from current medications. Per the chart review, appears that she has been intermittently confused, agitated, continuously complaining of pain. Pt is certainly very sick, but there is low-suspicion for an acute life-threatening event at this time. Not clear that there has really been any change in status. Pt new to RN and myself.   Discussed plan with RN to increase fentanyl dosing.   Received numerous pages requesting additional labs, imaging. These have been ordered as requested, will follow-up. RN obviously very concerned, wants to "rule out everything." Tried to reassure RN and pt, will follow-up pending studies. Nursing, as always, welcome to call with any additional concerns.

## 2017-03-29 NOTE — Progress Notes (Signed)
[facility] Progress Note  Subjective:  UOP inc to 1500 last 24 hours -  crt trending better  - c/o abd pain, more confused- has required mult PRNS for comfort.  WBC down to 15K,  K better as well - seems more comfortable but medicated this AM   Vitals:   03/29/17 0944 03/29/17 1000 03/29/17 1100 03/29/17 1200  BP: (!) 143/72 (!) 147/66 (!) 142/68 139/68  Pulse:  (!) 58 63 (!) 56  Resp:  13 17 (!) 22  Temp:      TempSrc:      SpO2:  98% 95% (!) 89%  Weight:      Height:        Inpatient medications: . amiodarone  100 mg Oral Daily  . amLODipine  5 mg Oral Daily  . aspirin  81 mg Oral Daily  . furosemide  40 mg Intravenous Q12H  . hydrALAZINE  75 mg Oral Q8H  . insulin aspart  0-15 Units Subcutaneous TID WC  . insulin glargine  5 Units Subcutaneous Daily  . levothyroxine  112 mcg Oral QAC breakfast  . mouth rinse  15 mL Mouth Rinse BID  . pantoprazole  40 mg Oral Daily  . patiromer  8.4 g Oral Daily  . polyethylene glycol  17 g Oral BID  . senna-docusate  1 tablet Oral BID  . sodium chloride flush  10-40 mL Intracatheter Q12H  . vitamin C  250 mg Oral BID   . cefTRIAXone (ROCEPHIN)  IV Stopped (03/28/17 1822)   acetaminophen **OR** acetaminophen, bisacodyl, fentaNYL (SUBLIMAZE) injection, hydrALAZINE, levalbuterol, lidocaine (PF), LORazepam, ondansetron (ZOFRAN) IV, sodium chloride flush  Exam: On nasal O2, not in distress Chest rales R base, L clear RRR no RG, 3/6 sem Abd soft ntnd no ascites Ext no LE edema NF, follows commands   Impression: 1.  Acute on CKD 4 -  ( baseline- reported around 2.1)- previous worse - vol status w/ pulm edema.  In need of heart cath and valve replacement; with CKD stage IV bordering stage V needed higher doses of lasix  to stabilize her first.   responded.  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  Pulm edema - resolved- but now with oliguria a little worse 3.  Severe MR - per cards- sounds like cardiac cath and surgical intervention way down the line- needs rehab 4.  HTN - bp's high, vol excess- also on norvasc 10 and hydralazine 75 q 8 (increased)  - lasix restarted 40 BID 5.  Hx CVA/ depression/ chronic pain- exacerbated now with retroperitoneal bleed 6. Anemia- giving iron- RPB- hgb drop- heparin stopped - cont to follow for transfusion need- hgb 6.8 today - probably needs a unit 7. Hypokalemia- had been repleting- stop scheduled ,  now needed veltassa to bring down- nephrostomy tube to correct - is better - stop veltassa  8. Elevated WBC- unsure etiology- cultures ordered, now on zosyn - is improving      Jayda White A  03/29/2017, 12:51 PM    Recent Labs Lab 03/27/17 1823 03/28/17 0548 03/28/17 2330 03/29/17 1156  NA 130* 134* 127* 132*  K 5.2* 5.1 4.1 3.6  CL 97* 99* 94* 98*  CO2 23 21* 21* 22  GLUCOSE 146* 145* 147* 186*  BUN 50* 53* 51* 52*  CREATININE 4.65* 4.92* 5.00*  4.64*  CALCIUM 8.8* 8.9 8.2* 8.2*  PHOS 5.4* 5.0*  --  4.2    Recent Labs Lab 03/25/17 0433 03/26/17 0953  03/28/17 0548 03/28/17 2330 03/29/17 1156  AST 46* 39  --   --  48*  --   ALT 193* 121*  --   --  90*  --   ALKPHOS 120 109  --   --  129*  --   BILITOT 1.1 1.0  --   --  1.4*  --   PROT 5.6* 5.8*  --   --  5.7*  --   ALBUMIN 3.1* 2.7*  < > 2.6* 2.7* 2.5*  < > = values in this interval not displayed.  Recent Labs Lab 03/27/17 0338 03/27/17 1810 03/28/17 0548 03/29/17 1156  WBC 42.0*  --  27.8* 15.4*  HGB 7.1* 7.4* 7.4* 6.8*  HCT 20.4* 21.7* 22.2* 20.1*  MCV 75.6*  --  76.0* 76.7*  PLT 141*  --  146* 145*   Iron/TIBC/Ferritin/ %Sat    Component Value Date/Time   IRON 62 03/20/2017 0406   TIBC 256 03/20/2017 0406   FERRITIN 148 03/20/2017 0406   IRONPCTSAT 24 03/20/2017 0406

## 2017-03-29 NOTE — Progress Notes (Signed)
Patient slept for about 1-1.5 hours and is now awake yelling and moaning very loudly. Fentanyl and Ativan given 30 minutes ago and no change. Still waiting results of abdominal and chest x-ray. Will continue to monitor.

## 2017-03-29 NOTE — Progress Notes (Signed)
Spoke with Dr.Opyd and asked if we could get x-ray STAT since no change in patients behavior. He informed me that it wasn't necessary to be done stat and whenever they can get to it is fine. Asked that I not call him back since her vital signs are stable and patient has a history of agitation. Will continue to wait for x-ray, administer medications when they are due, and monitor the patient closely.

## 2017-03-29 NOTE — Progress Notes (Signed)
No change in patients behavior. Text paged Dr. Myna Hidalgo to let him know. He added Ativan 1mg  IV. Will administer and continue to monitor.

## 2017-03-29 NOTE — Progress Notes (Signed)
OT Cancellation Note  Patient Details Name: Jocelyn Hill MRN: [id number] DOB: 1950/08/09   Cancelled Treatment:    Reason Eval/Treat Not Completed: Other (comment): Discussed with RN who requests OT hold evaluation at this time as pt just received Ativan and is very agitated. Will hold for now and check back as medically able.   Norman Herrlich, MS OTR/L  Pager: [place] A [name] 03/29/2017, 11:10 AM

## 2017-03-30 ENCOUNTER — Encounter (HOSPITAL_COMMUNITY): Payer: Self-pay | Admitting: Dentistry

## 2017-03-30 LAB — TYPE AND SCREEN
ABO/RH(D): O POS
Antibody Screen: NEGATIVE
Unit division: 0
Unit division: 0

## 2017-03-30 LAB — GLUCOSE, CAPILLARY
Glucose-Capillary: 121 mg/dL — ABNORMAL HIGH (ref 65–99)
Glucose-Capillary: 104 mg/dL — ABNORMAL HIGH (ref 65–99)
Glucose-Capillary: 176 mg/dL — ABNORMAL HIGH (ref 65–99)
Glucose-Capillary: 129 mg/dL — ABNORMAL HIGH (ref 65–99)

## 2017-03-30 LAB — CBC
HCT: 28.2 % — ABNORMAL LOW (ref 36.0–46.0)
Hemoglobin: 9.5 g/dL — ABNORMAL LOW (ref 12.0–15.0)
MCH: 26.4 pg (ref 26.0–34.0)
MCHC: 33.7 g/dL (ref 30.0–36.0)
MCV: 78.3 fL (ref 78.0–100.0)
Platelets: 148 10*3/uL — ABNORMAL LOW (ref 150–400)
RBC: 3.6 MIL/uL — ABNORMAL LOW (ref 3.87–5.11)
RDW: 16.2 % — ABNORMAL HIGH (ref 11.5–15.5)
WBC: 10.2 10*3/uL (ref 4.0–10.5)

## 2017-03-30 LAB — BPAM RBC
Blood Product Expiration Date: 201810192359
Blood Product Expiration Date: 201810242359
ISSUE DATE / TIME: 201809281054
ISSUE DATE / TIME: 201809301427
Unit Type and Rh: 5100
Unit Type and Rh: 5100

## 2017-03-30 LAB — RENAL FUNCTION PANEL
Albumin: 2.6 g/dL — ABNORMAL LOW (ref 3.5–5.0)
Anion gap: 17 — ABNORMAL HIGH (ref 5–15)
BUN: 49 mg/dL — ABNORMAL HIGH (ref 6–20)
CO2: 24 mmol/L (ref 22–32)
Calcium: 8.9 mg/dL (ref 8.9–10.3)
Chloride: 97 mmol/L — ABNORMAL LOW (ref 101–111)
Creatinine, Ser: 4.36 mg/dL — ABNORMAL HIGH (ref 0.44–1.00)
GFR calc Af Amer: 11 mL/min — ABNORMAL LOW (ref >60)
GFR calc non Af Amer: 10 mL/min — ABNORMAL LOW (ref >60)
Glucose, Bld: 115 mg/dL — ABNORMAL HIGH (ref 65–99)
Phosphorus: 4.1 mg/dL (ref 2.5–4.6)
Potassium: 3.7 mmol/L (ref 3.5–5.1)
Sodium: 138 mmol/L (ref 135–145)

## 2017-03-30 MED ORDER — INSULIN GLARGINE 100 UNIT/ML ~~LOC~~ SOLN
4.0000 [IU] | Freq: Every day | SUBCUTANEOUS | Status: DC
  Administered 2017-03-31 – 2017-04-04 (×5): 4 [IU] via SUBCUTANEOUS
  Filled 2017-03-30 (×5): qty 0.04

## 2017-03-30 NOTE — Progress Notes (Signed)
Physical Therapy Treatment Patient Details Name: Jocelyn Hill MRN: [id number] DOB: 1951/02/09 Today's Date: 03/30/2017    History of Present Illness Pt adm with Acute Respiratory failure with Hypoxia due to pulm edema and B Pleural effusions and Acute Diastolic CHF. Now with new retroperitoneal bleed. s/p right nephrostomy tube 9/29. PMH - ckd, dm, depression, cva    PT Comments    Patient progressing slowly towards PT goals. Tolerated standing for ~ 1 min supporting UEs on back of recliner and Mod A to stabilize bil knees due to buckling. Pt moaning throughout session due to being cold and pain in back. Requires max repetition to perform tasks. Will continue to follow. Needs to get OOB when appropriate.   Follow Up Recommendations  SNF     Equipment Recommendations  Rolling walker with 5" wheels    Recommendations for Other Services       Precautions / Restrictions Precautions Precautions: Fall Restrictions Weight Bearing Restrictions: No    Mobility  Bed Mobility Overal bed mobility: Needs Assistance Bed Mobility: Rolling;Sidelying to Sit;Sit to Sidelying Rolling: Min assist Sidelying to sit: Min assist;HOB elevated     Sit to sidelying: Mod assist;HOB elevated General bed mobility comments: Able to bring LEs to EOB, assist to elevate trunk, use of rail. Needed assist to bring LEs into bed to return to supine.   Transfers Overall transfer level: Needs assistance Equipment used: 2 person hand held assist Transfers: Sit to/from Stand Sit to Stand: Mod assist;+2 physical assistance         General transfer comment: Assist to power to standing with pt pulling up on back of recliner, performed x1. Pt with bil knee buckling. Cues for upright posture.   Ambulation/Gait                 Stairs            Wheelchair Mobility    Modified Rankin (Stroke Patients Only)       Balance Overall balance assessment: Needs assistance Sitting-balance  support: Feet supported;No upper extremity supported Sitting balance-Leahy Scale: Poor Sitting balance - Comments: Sitting EOB but falling forward unexpectedly; Needs Min guard-Min A for support. Collapsing on therapist's belly for rest. Does better with UE support.      Standing balance-Leahy Scale: Poor Standing balance comment: Reliant on BUE supported on chair and Mod A for support due to bil knees buckling. Able to stand ~1 min                            Cognition Arousal/Alertness: Awake/alert Behavior During Therapy: Restless;Flat affect Overall Cognitive Status: No family/caregiver present to determine baseline cognitive functioning                                 General Comments: Requires repetition to perform tasks and to answer questions appropriately. Needs verbal stimulus initially due to being sleepy. Moaning throughout session. " you guys are so mean."       Exercises      General Comments General comments (skin integrity, edema, etc.): Pt with tachycardia in 120s bpm.      Pertinent Vitals/Pain Pain Assessment: Faces Faces Pain Scale: Hurts even more Pain Location: generalized; back Pain Descriptors / Indicators: Moaning Pain Intervention(s): Monitored during session;Repositioned;Limited activity within patient's tolerance    Home Living  Prior Function            PT Goals (current goals can now be found in the care plan section) Progress towards PT goals: Progressing toward goals (slowly)    Frequency    Min 3X/week      PT Plan Current plan remains appropriate    Co-evaluation PT/OT/SLP Co-Evaluation/Treatment: Yes Reason for Co-Treatment: Necessary to address cognition/behavior during functional activity;For patient/therapist safety          AM-PAC PT "6 Clicks" Daily Activity  Outcome Measure  Difficulty turning over in bed (including adjusting bedclothes, sheets and blankets)?:  Unable Difficulty moving from lying on back to sitting on the side of the bed? : Unable Difficulty sitting down on and standing up from a chair with arms (e.g., wheelchair, bedside commode, etc,.)?: Unable Help needed moving to and from a bed to chair (including a wheelchair)?: A Lot Help needed walking in hospital room?: Total Help needed climbing 3-5 steps with a railing? : Total 6 Click Score: 7    End of Session   Activity Tolerance: Patient limited by fatigue;Other (comment) (being cold) Patient left: in bed;with call bell/phone within reach;with bed alarm set Nurse Communication: Mobility status PT Visit Diagnosis: Other abnormalities of gait and mobility (R26.89);Muscle weakness (generalized) (M62.81);Unsteadiness on feet (R26.81)     Time: 1505-6979 PT Time Calculation (min) (ACUTE ONLY): 24 min  Charges:  $Therapeutic Activity: 8-22 mins                    G Codes:       Wray Kearns, PT, DPT [phone]     Jocelyn Hill 03/30/2017, 2:15 PM

## 2017-03-30 NOTE — NC FL2 (Signed)
[place] LEVEL OF CARE SCREENING TOOL     IDENTIFICATION  Patient Name: Jocelyn Hill. Jocelyn Hill Birthdate: [birth year] Sex: female Admission Date (Current Location): 03/12/2017  [place] and [place] Number:  [employer] and Address:  [facility] [place] [place] [address] [place], [place] [address]      Provider Number: [id number]  Attending Physician Name and Address:  Cherene Altes, MD  Relative Name and Phone Number:  Baruch Gouty, daughter, [phone]    Current Level of Care: Hospital Recommended Level of Care: [place] Prior Approval Number:    Date Approved/Denied:   PASRR Number: [id number]  Discharge Plan: SNF    Current Diagnoses: Patient Active Problem List   Diagnosis Date Noted  . Substance abuse ([place])   . Acute diastolic CHF (congestive heart failure) ([place])   . Pulmonary hypertension ([place])   . Asystole ([place])   . Non-rheumatic mitral regurgitation   . Acute renal failure superimposed on stage 3 chronic kidney disease ([place])   . Anemia due to chronic kidney disease   . Controlled diabetes mellitus type 2 with complications ([place])   . Acute encephalopathy   . Severe mitral regurgitation   . CKD (chronic kidney disease), stage IV ([place])   . 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 Microcytic anemia 03/13/2017  . Hyperlipidemia 03/12/2017  . Hypertension 03/12/2017  . Hypothyroidism 03/12/2017  . Diet-controlled diabetes mellitus ([place]) 03/12/2017  . Acute diastolic (congestive) heart failure ([place]) 03/12/2017  . AKI (acute kidney injury) ([place]) 03/12/2017  . Depression 03/12/2017  . CKD (chronic kidney disease), stage III ([place]) 03/12/2017  . CHF (congestive heart failure) ([place]) 03/12/2017  . Dysphagia 03/12/2017  . Acute on chronic respiratory failure with hypoxia ([facility]) 03/12/2017    Orientation RESPIRATION BLADDER Height & Weight     Self, Time, Place  O2 (Nasal cannula) Continent, Indwelling catheter  (Nephrostomy) Weight: 60.3 kg (132 lb 15 oz) Height:  5\' 9"  (175.3 cm)  BEHAVIORAL SYMPTOMS/MOOD NEUROLOGICAL BOWEL NUTRITION STATUS      Continent Diet (Please see DC Summary)  AMBULATORY STATUS COMMUNICATION OF NEEDS Skin   Extensive Assist Verbally Normal                       Personal Care Assistance Level of Assistance  Bathing, Feeding, Dressing Bathing Assistance: Maximum assistance Feeding assistance: Limited assistance Dressing Assistance: Limited assistance     Functional Limitations Info             SPECIAL CARE FACTORS FREQUENCY  PT (By licensed PT)     PT Frequency: 5x/week              Contractures      Additional Factors Info  Code Status, Allergies, Insulin Sliding Scale Code Status Info: Full Allergies Info: Ace Inhibitors   Insulin Sliding Scale Info: 3x daily with meals       Current Medications (03/30/2017):  This is the current hospital active medication list Current Facility-Administered Medications  Medication Dose Route Frequency Provider Last Rate Last Dose  . acetaminophen (TYLENOL) tablet 500 mg  500 mg Oral Q6H PRN Cherene Altes, MD       Or  . acetaminophen (TYLENOL) suppository 325 mg  325 mg Rectal Q6H PRN Cherene Altes, MD      . amiodarone (PACERONE) tablet 100 mg  100 mg Oral Daily Larey Dresser, MD   100 mg at 03/30/17 1002  . amLODipine (NORVASC) tablet  5 mg  5 mg Oral Daily Larey Dresser, MD   5 mg at 03/30/17 1002  . aspirin chewable tablet 81 mg  81 mg Oral Daily Cherene Altes, MD   81 mg at 03/30/17 1002  . bisacodyl (DULCOLAX) suppository 10 mg  10 mg Rectal Daily PRN Jennelle Human B, NP   10 mg at 03/22/17 1255  . cefTRIAXone (ROCEPHIN) 2 g in dextrose 5 % 50 mL IVPB  2 g Intravenous Q24H Cherene Altes, MD   Stopped at 03/29/17 1859  . fentaNYL (SUBLIMAZE) injection 50 mcg  50 mcg Intravenous Q1H PRN Opyd, Ilene Qua, MD   50 mcg at 03/30/17 1556  . furosemide (LASIX) injection 40 mg  40 mg  Intravenous Q12H Corliss Parish, MD   40 mg at 03/30/17 1236  . hydrALAZINE (APRESOLINE) injection 10 mg  10 mg Intravenous Q4H PRN Shirley Friar, PA-C   10 mg at 03/26/17 6237  . hydrALAZINE (APRESOLINE) tablet 75 mg  75 mg Oral Q8H Clegg, Amy D, NP   75 mg at 03/30/17 1543  . insulin aspart (novoLOG) injection 0-15 Units  0-15 Units Subcutaneous TID WC Allie Bossier, MD   2 Units at 03/30/17 [id number]  . [START ON 03/31/2017] insulin glargine (LANTUS) injection 4 Units  4 Units Subcutaneous Daily Joette Catching T, MD      . levalbuterol Penne Lash) nebulizer solution 1.25 mg  1.25 mg Nebulization Q3H PRN Cherene Altes, MD      . levothyroxine (SYNTHROID, LEVOTHROID) tablet 112 mcg  112 mcg Oral QAC breakfast Cherene Altes, MD   112 mcg at 03/30/17 1001  . lidocaine (PF) (XYLOCAINE) 1 % injection    PRN Aletta Edouard, MD   5 mL at 03/27/17 1536  . LORazepam (ATIVAN) injection 1-2 mg  1-2 mg Intravenous Q4H PRN Cherene Altes, MD   2 mg at 03/30/17 0841  . MEDLINE mouth rinse  15 mL Mouth Rinse BID Allie Bossier, MD   15 mL at 03/30/17 1003  . ondansetron (ZOFRAN) injection 4 mg  4 mg Intravenous Q6H PRN Desai, Rahul P, PA-C   4 mg at 03/26/17 0326  . pantoprazole (PROTONIX) EC tablet 40 mg  40 mg Oral Daily Allie Bossier, MD   40 mg at 03/30/17 1002  . polyethylene glycol (MIRALAX / GLYCOLAX) packet 17 g  17 g Oral BID Cherene Altes, MD   17 g at 03/30/17 1003  . QUEtiapine (SEROQUEL) tablet 12.5 mg  12.5 mg Oral QHS Cherene Altes, MD   12.5 mg at 03/29/17 2122  . senna-docusate (Senokot-S) tablet 1 tablet  1 tablet Oral BID Cherene Altes, MD   1 tablet at 03/30/17 1002  . sodium chloride flush (NS) 0.9 % injection 10-40 mL  10-40 mL Intracatheter Q12H Hammonds, Sharyn Blitz, MD   10 mL at 03/30/17 1003  . sodium chloride flush (NS) 0.9 % injection 10-40 mL  10-40 mL Intracatheter PRN Hammonds, Sharyn Blitz, MD      . vitamin C (ASCORBIC ACID) tablet 250  mg  250 mg Oral BID Allie Bossier, MD   250 mg at 03/30/17 1002     Discharge Medications: Please see discharge summary for a list of discharge medications.  Relevant Imaging Results:  Relevant Lab Results:   Additional Information SSn: [place], [place]

## 2017-03-30 NOTE — Progress Notes (Signed)
[place]  ZWC:[id number] DOB: 1951/03/24 DOA: 03/12/2017 PCP: [name], MD    Brief Narrative:  66yo F w/ a hx of anxiety, depression, CVA, poorly controlled HTN, CKD stage III, DM2 with peripheral neuropathy, and Hypothyroidism who presented w/ a month of gradually worsening dyspnea.  She initially presented to the ED at [facility] where she was found to have a BNP of 1700.  CXR noted moderate bilateral pleural effusions. Because of worsening creatinine and decompensated CHF, it was felt best to transfer her to [facility].   Subjective: The patient is resting much more comfortably this morning.  She denies shortness of breath or chest pain at this time.  Assessment & Plan:  Acute Respiratory failure with Hypoxia due to pulm edema and B Pleural effusions  R thoracentesis 9/25 yielded ~350cc of fluid (exudate per LDH, but transudate per protein) - continues to require oxygen support but sats good on HFNC - cont to wean as able   Large Right retroperitoneal hematoma - acute blood loss anemia in setting of chronic anemia due to CKD Noted via CT abdom 9/25 - transfused 2U PRBC thus far - Hgb improved - hematoma stable on f/u CT 9/27   Recent Labs Lab 03/27/17 0338 03/27/17 1810 03/28/17 0548 03/29/17 1156 03/29/17 1910 03/30/17 0616  HGB 7.1* 7.4* 7.4* 6.8* 7.8* 9.5*    Acute renal failure on CKD stage III - R obstructive hydronephrosis  Care as per Nephrology - baseline crt ~2.1 - acute failure due to obstructive hydronephrosis related to [facility] - status post percutaneous nephrostomy tube 9/28 - crt appears to be falling at this time   Recent Labs Lab 03/27/17 1823 03/28/17 0548 03/28/17 2330 03/29/17 1156 03/30/17 0603  CREATININE 4.65* 4.92* 5.00* 4.64* 4.36*    E coli and Kleb pneumo pyelonephritis w/ E coli bacteremia In setting of acute obstructive hydronephrosis - cont Rocephin to assure E coli and Kleb both adequately  covered - clinical indicators of infxn are improving   Acute Diastolic CHF ongoing diuresis w/ IV lasix - net negative ~20 L at this time  [facility] Weights   03/28/17 0316 03/29/17 0432 03/30/17 0415  Weight: 60.9 kg (134 lb 4.2 oz) 58 kg (127 lb 13.9 oz) 60.3 kg (132 lb 15 oz)    Severe Mitral Valve regurgitation TCTS following, and they have placed Dental consult - for eventual valve surgery once medically stable otherwise  Acute encephalopathy  Multifactorial - appears to be stabilizing somewhat on seroquel - cont for now   CAD - Elevated troponin / demand ischemia to have cardiac cath when renal fxn allows   Parox Afib/flutter To consider Maze w/ MVR - Cards has evaluated - NSR on amiodarone presently with dose decreased due to junctional rhythm - unable to anticoag at this time due to [facility]  DM2 9/13 A1c 6.2 - CBG controlled   Hypothyroidism Cont home synthroid dose   Hyperkalemia  Due to acute renal failure - resolved   Transamintis Likely hepatic congestion in setting of CHF - acute hepatitis panel negative - LFTs improving    DVT prophylaxis: SCDs Code Status: FULL CODE Family Communication: no family present at time of exam  Disposition Plan: SDU   Consultants:  CHF Team  Cardiology  TCTS PCCM  Procedures: 9/13 TTE - EF 60-65% - grade 2 DD - severe MR - mildly dilated LA and RA 9/18 TEE - severe regurgitation MV 9/28 R perc neph  tube   Antimicrobials:  Azithromycin 9/15 > 9/17 Zosyn 9/27 Cipro 9/28  Rocephin 9/28 >  Objective: Blood pressure 125/60, pulse (!) 53, temperature 98.3 F (36.8 C), temperature source Oral, resp. rate 19, height 5\' 9"  (1.753 m), weight 60.3 kg (132 lb 15 oz), SpO2 91 %.  Intake/Output Summary (Last 24 hours) at 03/30/17 1058 Last data filed at 03/30/17 1003  Gross per 24 hour  Intake              710 ml  Output             2080 ml  Net            -1370 ml   Filed Weights   03/28/17 0316 03/29/17 0432 03/30/17 0415    Weight: 60.9 kg (134 lb 4.2 oz) 58 kg (127 lb 13.9 oz) 60.3 kg (132 lb 15 oz)    Examination: General: No acute respiratory distress - more calm Lungs: no wheezing or crackles B fields  Cardiovascular: RRR w/ stable 3/6 holosystolic M Abdomen: NT/ND, bowel sounds positive, no mass, no rebound  Extremities: No signif edema B lower extremities   CBC:  Recent Labs Lab 03/27/17 0338 03/27/17 1810 03/28/17 0548 03/29/17 1156 03/29/17 1910 03/30/17 0616  WBC 42.0*  --  27.8* 15.4* 13.4* 10.2  HGB 7.1* 7.4* 7.4* 6.8* 7.8* 9.5*  HCT 20.4* 21.7* 22.2* 20.1* 23.2* 28.2*  MCV 75.6*  --  76.0* 76.7* 78.1 78.3  PLT 141*  --  146* 145* 149* 952*   Basic Metabolic Panel:  Recent Labs Lab 03/24/17 1749  03/26/17 0953 03/26/17 1500 03/26/17 2147  03/27/17 1823 03/28/17 0548 03/28/17 2330 03/29/17 1156 03/30/17 0603  NA  --   < > 131* 130*  --   < > 130* 134* 127* 132* 138  K 4.1  < > 5.9* 5.9* 5.7*  < > 5.2* 5.1 4.1 3.6 3.7  CL  --   < > 93* 94*  --   < > 97* 99* 94* 98* 97*  CO2  --   < > 25 26  --   < > 23 21* 21* 22 24  GLUCOSE  --   < > 144* 165*  --   < > 146* 145* 147* 186* 115*  BUN  --   < > 33* 36*  --   < > 50* 53* 51* 52* 49*  CREATININE  --   < > 3.62* 3.61*  --   < > 4.65* 4.92* 5.00* 4.64* 4.36*  CALCIUM  --   < > 9.7 9.3  --   < > 8.8* 8.9 8.2* 8.2* 8.9  MG 1.8  --  1.8 1.8 1.6*  --   --   --  1.8  --   --   PHOS  --   --   --   --   --   --  5.4* 5.0*  --  4.2 4.1  < > = values in this interval not displayed. GFR: Estimated Creatinine Clearance: 12.2 mL/min (A) (by C-G formula based on SCr of 4.36 mg/dL (H)).  Liver Function Tests:  Recent Labs Lab 03/25/17 0433 03/26/17 0953 03/27/17 1823 03/28/17 0548 03/28/17 2330 03/29/17 1156 03/30/17 0603  AST 46* 39  --   --  48*  --   --   ALT 193* 121*  --   --  90*  --   --   ALKPHOS 120 109  --   --  129*  --   --   BILITOT 1.1 1.0  --   --  1.4*  --   --   PROT 5.6* 5.8*  --   --  5.7*  --   --    ALBUMIN 3.1* 2.7* 2.5* 2.6* 2.7* 2.5* 2.6*    Coagulation Profile:  Recent Labs Lab 03/24/17 0753 03/26/17 1132 03/26/17 2147  INR 1.16 1.33 1.34    HbA1C: Hgb A1c MFr Bld  Date/Time Value Ref Range Status  03/12/2017 02:00 PM 6.2 (H) 4.8 - 5.6 % Final    Comment:    (NOTE) Pre diabetes:          5.7%-6.4% Diabetes:              >6.4% Glycemic control for   <7.0% adults with diabetes     CBG:  Recent Labs Lab 03/29/17 0823 03/29/17 1345 03/29/17 1544 03/29/17 2055 03/30/17 0815  GLUCAP 146* 166* 131* 138* 121*    Recent Results (from the past 240 hour(s))  Gram stain     Status: None   Collection Time: 03/24/17  4:23 PM  Result Value Ref Range Status   Specimen Description FLUID PLEURAL RIGHT  Final   Special Requests NONE  Final   Gram Stain   Final    WBC PRESENT, PREDOMINANTLY MONONUCLEAR NO ORGANISMS SEEN CYTOSPIN SMEAR    Report Status 03/24/2017 FINAL  Final  Fungus Culture With Stain     Status: None (Preliminary result)   Collection Time: 03/24/17  4:23 PM  Result Value Ref Range Status   Fungus Stain Final report  Final    Comment: (NOTE) Performed At: [facility] [place], [place] [id number] Lindon Romp MD BP:[id number]    Fungus (Mycology) Culture PENDING  Incomplete   Fungal Source FLUID  Final    Comment: PLEURAL RIGHT   Acid Fast Smear (AFB)     Status: None   Collection Time: 03/24/17  4:23 PM  Result Value Ref Range Status   AFB Specimen Processing Concentration  Final   Acid Fast Smear Negative  Final    Comment: (NOTE) Performed At: [facility] [address] [place], [place] [id number] Lindon Romp MD ER:[id number]    Source (AFB) FLUID  Final    Comment: PLEURAL RIGHT   Culture, body fluid-bottle     Status: None   Collection Time: 03/24/17  4:23 PM  Result Value Ref Range Status   Specimen Description FLUID PLEURAL RIGHT  Final   Special Requests BOTTLES DRAWN AEROBIC AND  ANAEROBIC  Final   Culture NO GROWTH 5 DAYS  Final   Report Status 03/29/2017 FINAL  Final  Fungus Culture Result     Status: None   Collection Time: 03/24/17  4:23 PM  Result Value Ref Range Status   Result 1 Comment  Final    Comment: (NOTE) KOH/Calcofluor preparation:  no fungus observed. Performed At: [facility] [place], [place] [id number] Lindon Romp MD IW:[id number]   Urine Culture     Status: Abnormal   Collection Time: 03/26/17  8:07 AM  Result Value Ref Range Status   Specimen Description URINE, CATHETERIZED  Final   Special Requests NONE  Final   Culture (A)  Final    >=100,000 COLONIES/mL KLEBSIELLA PNEUMONIAE >=100,000 COLONIES/mL ESCHERICHIA COLI    Report Status 03/29/2017 FINAL  Final   Organism ID, Bacteria KLEBSIELLA PNEUMONIAE (A)  Final   Organism ID, Bacteria ESCHERICHIA  COLI (A)  Final      Susceptibility   Escherichia coli - MIC*    AMPICILLIN 4 SENSITIVE Sensitive     CEFAZOLIN <=4 SENSITIVE Sensitive     CEFTRIAXONE <=1 SENSITIVE Sensitive     CIPROFLOXACIN <=0.25 SENSITIVE Sensitive     GENTAMICIN <=1 SENSITIVE Sensitive     IMIPENEM <=0.25 SENSITIVE Sensitive     NITROFURANTOIN <=16 SENSITIVE Sensitive     TRIMETH/SULFA >=320 RESISTANT Resistant     AMPICILLIN/SULBACTAM <=2 SENSITIVE Sensitive     PIP/TAZO <=4 SENSITIVE Sensitive     Extended ESBL NEGATIVE Sensitive     * >=100,000 COLONIES/mL ESCHERICHIA COLI   Klebsiella pneumoniae - MIC*    AMPICILLIN >=32 RESISTANT Resistant     CEFAZOLIN <=4 SENSITIVE Sensitive     CEFTRIAXONE <=1 SENSITIVE Sensitive     CIPROFLOXACIN <=0.25 SENSITIVE Sensitive     GENTAMICIN <=1 SENSITIVE Sensitive     IMIPENEM <=0.25 SENSITIVE Sensitive     NITROFURANTOIN 64 INTERMEDIATE Intermediate     TRIMETH/SULFA <=20 SENSITIVE Sensitive     AMPICILLIN/SULBACTAM 4 SENSITIVE Sensitive     PIP/TAZO <=4 SENSITIVE Sensitive     Extended ESBL NEGATIVE Sensitive     * >=100,000  COLONIES/mL KLEBSIELLA PNEUMONIAE  Culture, blood (routine x 2)     Status: None (Preliminary result)   Collection Time: 03/26/17 10:40 AM  Result Value Ref Range Status   Specimen Description BLOOD LEFT HAND  Final   Special Requests IN PEDIATRIC BOTTLE Blood Culture adequate volume  Final   Culture NO GROWTH 3 DAYS  Final   Report Status PENDING  Incomplete  Culture, blood (routine x 2)     Status: Abnormal   Collection Time: 03/26/17 10:40 AM  Result Value Ref Range Status   Specimen Description BLOOD RIGHT HAND  Final   Special Requests IN PEDIATRIC BOTTLE Blood Culture adequate volume  Final   Culture  Setup Time   Final    GRAM NEGATIVE RODS IN PEDIATRIC BOTTLE CRITICAL RESULT CALLED TO, READ BACK BY AND VERIFIED WITH: G.ABBOTT PHARMD 03/27/17 0548 L.CHAMPION    Culture ESCHERICHIA COLI (A)  Final   Report Status 03/29/2017 FINAL  Final   Organism ID, Bacteria ESCHERICHIA COLI  Final      Susceptibility   Escherichia coli - MIC*    AMPICILLIN 4 SENSITIVE Sensitive     CEFAZOLIN <=4 SENSITIVE Sensitive     CEFEPIME <=1 SENSITIVE Sensitive     CEFTAZIDIME <=1 SENSITIVE Sensitive     CEFTRIAXONE <=1 SENSITIVE Sensitive     CIPROFLOXACIN <=0.25 SENSITIVE Sensitive     GENTAMICIN <=1 SENSITIVE Sensitive     IMIPENEM <=0.25 SENSITIVE Sensitive     TRIMETH/SULFA >=320 RESISTANT Resistant     AMPICILLIN/SULBACTAM <=2 SENSITIVE Sensitive     PIP/TAZO <=4 SENSITIVE Sensitive     Extended ESBL NEGATIVE Sensitive     * ESCHERICHIA COLI  Blood Culture ID Panel (Reflexed)     Status: Abnormal   Collection Time: 03/26/17 10:40 AM  Result Value Ref Range Status   Enterococcus species NOT DETECTED NOT DETECTED Final   Listeria monocytogenes NOT DETECTED NOT DETECTED Final   Staphylococcus species NOT DETECTED NOT DETECTED Final   Staphylococcus aureus NOT DETECTED NOT DETECTED Final   Streptococcus species NOT DETECTED NOT DETECTED Final   Streptococcus agalactiae NOT DETECTED NOT  DETECTED Final   Streptococcus pneumoniae NOT DETECTED NOT DETECTED Final   Streptococcus pyogenes NOT DETECTED NOT DETECTED Final  Acinetobacter baumannii NOT DETECTED NOT DETECTED Final   Enterobacteriaceae species DETECTED (A) NOT DETECTED Final    Comment: Enterobacteriaceae represent a large family of gram-negative bacteria, not a single organism. CRITICAL RESULT CALLED TO, READ BACK BY AND VERIFIED WITH: G.ABBOTT PHARMD 03/27/17 0548 L.CHAMPION    Enterobacter cloacae complex NOT DETECTED NOT DETECTED Final   Escherichia coli DETECTED (A) NOT DETECTED Final    Comment: CRITICAL RESULT CALLED TO, READ BACK BY AND VERIFIED WITH: G.ABBOTT PHARMD 03/27/17 0548 L.CHAMPION    Klebsiella oxytoca NOT DETECTED NOT DETECTED Final   Klebsiella pneumoniae NOT DETECTED NOT DETECTED Final   Proteus species NOT DETECTED NOT DETECTED Final   Serratia marcescens NOT DETECTED NOT DETECTED Final   Carbapenem resistance NOT DETECTED NOT DETECTED Final   Haemophilus influenzae NOT DETECTED NOT DETECTED Final   Neisseria meningitidis NOT DETECTED NOT DETECTED Final   Pseudomonas aeruginosa NOT DETECTED NOT DETECTED Final   Candida albicans NOT DETECTED NOT DETECTED Final   Candida glabrata NOT DETECTED NOT DETECTED Final   Candida krusei NOT DETECTED NOT DETECTED Final   Candida parapsilosis NOT DETECTED NOT DETECTED Final   Candida tropicalis NOT DETECTED NOT DETECTED Final  Culture, Urine     Status: Abnormal   Collection Time: 03/26/17  3:38 PM  Result Value Ref Range Status   Specimen Description URINE, RANDOM  Final   Special Requests NONE  Final   Culture MULTIPLE SPECIES PRESENT, SUGGEST RECOLLECTION (A)  Final   Report Status 03/27/2017 FINAL  Final     Scheduled Meds: . amiodarone  100 mg Oral Daily  . amLODipine  5 mg Oral Daily  . aspirin  81 mg Oral Daily  . furosemide  40 mg Intravenous Q12H  . hydrALAZINE  75 mg Oral Q8H  . insulin aspart  0-15 Units Subcutaneous TID WC  .  insulin glargine  5 Units Subcutaneous Daily  . levothyroxine  112 mcg Oral QAC breakfast  . mouth rinse  15 mL Mouth Rinse BID  . pantoprazole  40 mg Oral Daily  . polyethylene glycol  17 g Oral BID  . QUEtiapine  12.5 mg Oral QHS  . senna-docusate  1 tablet Oral BID  . sodium chloride flush  10-40 mL Intracatheter Q12H  . vitamin C  250 mg Oral BID     LOS: 18 days   Cherene Altes, MD [facility] Office  [phone] Pager - Text Page per Shea Evans as per below:  On-Call/Text Page:      [email or web address]      password [email or web address]  If 7PM-7AM, please contact night-coverage [email or web address] Password [email or web address] 03/30/2017, 10:58 AM

## 2017-03-30 NOTE — Progress Notes (Signed)
Referring Physician(s): Dr Shirl Harris  Supervising Physician: Marybelle Killings  Patient Status:  Jocelyn [facility] - In-pt  Chief Complaint:  Right hydronephrosis Rt PCN placed 9/28  Subjective:  Retroperitoneal bleed with obstruction of rt ureter PCN placed  Cr improving   Allergies: Ace inhibitors  Medications: Prior to Admission medications   Medication Sig Start Date End Date Taking? Authorizing Provider  albuterol (PROVENTIL HFA;VENTOLIN HFA) 108 (90 Base) MCG/ACT inhaler Inhale 2 puffs into the lungs every 4 (four) hours as needed for wheezing or shortness of breath.   Yes [provider]  amLODipine (NORVASC) 10 MG tablet Take 10 mg by mouth daily.   Yes [provider]  aspirin EC 81 MG tablet Take 81 mg by mouth daily.   Yes [provider]  atorvastatin (LIPITOR) 40 MG tablet Take 40 mg by mouth daily.   Yes [provider]  b complex vitamins capsule Take 1 capsule by mouth daily.   Yes [provider]  buPROPion (WELLBUTRIN XL) 300 MG 24 hr tablet Take 300 mg by mouth daily.   Yes [provider]  cholecalciferol (VITAMIN D) 1000 units tablet Take 1,000 Units by mouth daily.   Yes [provider]  DULoxetine (CYMBALTA) 30 MG capsule Take 30 mg by mouth daily.   Yes [provider]  gabapentin (NEURONTIN) 300 MG capsule Take 300 mg by mouth at bedtime.   Yes [provider]  hydrALAZINE (APRESOLINE) 100 MG tablet Take 100 mg by mouth 3 (three) times daily.   Yes [provider]  labetalol (NORMODYNE) 200 MG tablet Take 200 mg by mouth 2 (two) times daily.   Yes [provider]  levothyroxine (SYNTHROID, LEVOTHROID) 112 MCG tablet Take 112 mcg by mouth daily before breakfast.   Yes [provider]  minoxidil (LONITEN) 2.5 MG tablet Take by mouth daily.   Yes [provider]  montelukast (SINGULAIR) 10 MG tablet Take 10 mg by mouth daily.   Yes [provider]  Oxycodone HCl 10 MG TABS Take 10 mg by mouth 3 (three) times daily as needed (pain).   Yes [provider]  pantoprazole (PROTONIX) 40 MG tablet Take 40 mg by mouth daily.   Yes [provider]  potassium gluconate (HM POTASSIUM) 595 (99 K) MG TABS tablet Take 595 mg by mouth daily.   Yes [provider]  pregabalin (LYRICA) 150 MG capsule Take 150 mg by mouth daily.   Yes [provider]  tiZANidine (ZANAFLEX) 4 MG capsule Take 4 mg by mouth 3 (three) times daily.   Yes [provider]  zolpidem (AMBIEN) 10 MG tablet Take 10 mg by mouth at bedtime as needed for sleep.   Yes [provider]  busPIRone (BUSPAR) 15 MG tablet Take 15 mg by mouth 2 (two) times daily.    [provider]     Vital Signs: BP 125/60   Pulse (!) 53   Temp 98.3 F (36.8 C) (Oral)   Resp 19   Ht 5\' 9"  (1.753 m)   Wt 132 lb 15 oz (60.3 kg)   SpO2 91%   BMI 19.63 kg/m   Physical Exam  Abdominal: Soft.  Neurological: She is alert.  Skin: Skin is warm and dry.  Right PCN intact Site is clean and dry NT No bleeding Urine with great output Minimally blood tinged at this point  Nursing note and vitals reviewed.   Imaging: Ct Abdomen Pelvis Wo Contrast  Result Date: 03/26/2017 CLINICAL DATA:  Retroperitoneal hematoma. EXAM: CT ABDOMEN AND PELVIS WITHOUT CONTRAST TECHNIQUE: Multidetector CT imaging of the abdomen and pelvis was performed following the standard protocol without IV contrast. COMPARISON:  CT scan of March 24, 2017. FINDINGS: Lower chest: Mild left posterior basilar atelectasis or infiltrate is noted. Hepatobiliary: Dilated gallbladder is again noted and unchanged. No focal abnormality is seen in the liver on these unenhanced images. Pancreas: Unremarkable. No pancreatic ductal dilatation or surrounding inflammatory changes. Spleen: Normal in size without focal abnormality. Adrenals/Urinary Tract: Adrenal glands appear  normal. Severe left renal atrophy is noted. Moderate right hydronephrosis with perinephric stranding is noted, which appears to be due to retroperitoneal hematoma described on prior exam. Foley catheter is present within the urinary bladder. No renal or ureteral calculi are noted. Stomach/Bowel: Stomach is within normal limits. Appendix appears normal. No evidence of bowel wall thickening, distention, or inflammatory changes. Vascular/Lymphatic: Aortic atherosclerosis. No enlarged abdominal or pelvic lymph nodes. Reproductive: Status post hysterectomy. No adnexal masses. Other: Stable right retroperitoneal hematoma is noted which extends along the right psoas muscle and into the right pelvis and slightly into the right inguinal region. Musculoskeletal: No acute or significant osseous findings. IMPRESSION: Stable right retroperitoneal hematoma is noted which extends along the right psoas muscle into the right pelvis and slightly in the right inguinal region. There is now noted moderate right hydronephrosis, most likely due to obstruction of distal ureter secondary to hematoma. Severe left renal atrophy is noted. Aortic atherosclerosis. Dilated gallbladder is noted without definite surrounding inflammation. Electronically Signed   By: Marijo Conception, M.D.   On: 03/26/2017 15:40   Dg Chest Port 1 View  Result Date: 03/29/2017 CLINICAL DATA:  Pt has not had a BM since 9/21, c/o low back pain, recent nephrostomy tube placement, and recent retroperitoneal bleed. Pt has been groaning in pain for hours. EXAM: PORTABLE CHEST 1 VIEW COMPARISON:  03/26/2017 FINDINGS: Right IJV catheter is in place, crimped in the region of the neck. The heart is enlarged. There are hazy airspace filling opacities bilaterally, compatible with edema or infectious process. No pleural effusions. IMPRESSION: Cardiomegaly.  Suspect increased edema or infection. Electronically Signed   By: Nolon Nations M.D.   On: 03/29/2017 07:20   Dg Abd  Portable 1v  Result Date: 03/29/2017 CLINICAL DATA:  Pt has not had a BM since 9/21, c/o low back pain, recent nephrostomy tube placement, and recent retroperitoneal bleed. Pt has been groaning in pain for hours. EXAM: PORTABLE ABDOMEN - 1 VIEW COMPARISON:  03/17/2017 FINDINGS: Right-sided percutaneous nephrostomy has been placed since the prior study. There is contrast within nondilated loops of colon. Average stool burden. No evidence for free intraperitoneal air or bowel obstruction. Scattered phleboliths are identified within the pelvis. IMPRESSION: Nonobstructive bowel gas pattern. Retained contrast, presumably follow up CT of the abdomen and pelvis on 03/26/2017. Electronically Signed   By: Nolon Nations M.D.   On: 03/29/2017 07:18   Ir Nephrostomy Placement Right  Result Date: 03/27/2017 INDICATION: Right-sided hydronephrosis secondary to ureteral obstruction. EXAM: IR NEPHROSTOMY PLACEMENT RIGHT COMPARISON:  CT of the abdomen on 03/26/2017 MEDICATIONS: Ciprofloxacin 400 mg IV; The antibiotic was administered in an appropriate time frame prior to skin puncture. ANESTHESIA/SEDATION: Fentanyl 100 mcg IV; Versed 4.0 mg IV Moderate Sedation Time:  10 minutes. The patient was continuously monitored during the procedure by the interventional radiology nurse under my direct supervision. CONTRAST:  10 mL Isovue-300 - administered into the collecting system(s) FLUOROSCOPY  TIME:  Fluoroscopy Time: 1 minutes and 30 seconds (4.1 mGy). COMPLICATIONS: None immediate. PROCEDURE: Informed written consent was obtained from the patient after a thorough discussion of the procedural risks, benefits and alternatives. All questions were addressed. Maximal Sterile Barrier Technique was utilized including caps, mask, sterile gowns, sterile gloves, sterile drape, hand hygiene and skin antiseptic. A timeout was performed prior to the initiation of the procedure. Ultrasound was used to localize the right kidney. Under direct  ultrasound guidance, a 21 gauge needle was advanced into the collecting system. After aspiration of urine, contrast was injected. A guidewire was advanced into the collecting system. A transitional dilator was placed over the wire. The percutaneous tract was dilated over the wire and a 10 French percutaneous nephrostomy tube advanced. This was formed in the collecting system and a fluoroscopic spot image obtained after injection of contrast material. The catheter was flushed and connected to a gravity drainage bag. It was secured at the skin with a Prolene retention suture and StatLock device. FINDINGS: By ultrasound and also later with contrast injection, hydronephrosis of the right kidney does appear to be improved. However, given renal failure progressive hyperkalemia, there was felt to be indication to proceed with nephrostomy tube placement. The tube was placed via interpolar access with the catheter formed in the renal pelvis. IMPRESSION: Right-sided percutaneous nephrostomy tube placement with placement of a 10 French catheter formed at the level of the renal pelvis. The catheter was attached to gravity bag drainage. Electronically Signed   By: Aletta Edouard M.D.   On: 03/27/2017 16:21    Labs:  CBC:  Recent Labs  03/28/17 0548 03/29/17 1156 03/29/17 1910 03/30/17 0616  WBC 27.8* 15.4* 13.4* 10.2  HGB 7.4* 6.8* 7.8* 9.5*  HCT 22.2* 20.1* 23.2* 28.2*  PLT 146* 145* 149* 148*    COAGS:  Recent Labs  03/17/17 0439 03/24/17 0753 03/26/17 1132 03/26/17 2147  INR 1.53 1.16 1.33 1.34  APTT  --   --  63*  --     BMP:  Recent Labs  03/28/17 0548 03/28/17 2330 03/29/17 1156 03/30/17 0603  NA 134* 127* 132* 138  K 5.1 4.1 3.6 3.7  CL 99* 94* 98* 97*  CO2 21* 21* 22 24  GLUCOSE 145* 147* 186* 115*  BUN 53* 51* 52* 49*  CALCIUM 8.9 8.2* 8.2* 8.9  CREATININE 4.92* 5.00* 4.64* 4.36*  GFRNONAA 8* 8* 9* 10*  GFRAA 10* 10* 10* 11*    LIVER FUNCTION TESTS:  Recent Labs   03/21/17 0444 03/25/17 0433 03/26/17 0953  03/28/17 0548 03/28/17 2330 03/29/17 1156 03/30/17 0603  BILITOT 1.3* 1.1 1.0  --   --  1.4*  --   --   AST 162* 46* 39  --   --  48*  --   --   ALT 677* 193* 121*  --   --  90*  --   --   ALKPHOS 85 120 109  --   --  129*  --   --   PROT 6.0* 5.6* 5.8*  --   --  5.7*  --   --   ALBUMIN 3.2* 3.1* 2.7*  < > 2.6* 2.7* 2.5* 2.6*  < > = values in this interval not displayed.  Assessment and Plan:  Right PCN in place Cr improving Will follow Plan per Dr Moshe Cipro  Electronically Signed: Monia Sabal A, PA-C 03/30/2017, 10:52 AM   I spent a total of 15 Minutes at the the patient's bedside AND  on the patient's [facility] floor or unit, greater than 50% of which was counseling/coordinating care for Rt PCN

## 2017-03-30 NOTE — Progress Notes (Signed)
  Speech Language Pathology Treatment: Dysphagia  Patient Details Name: Jocelyn Hill. Hayden MRN: [id number] DOB: 06-25-1951 Today's Date: 03/30/2017 Time: 9242-6834 SLP Time Calculation (min) (ACUTE ONLY): 12 min  Assessment / Plan / Recommendation Clinical Impression  Pt is drowsy this morning, requiring Mod cues for sustained attention to oral preparation even with pureed foods on her tray (may be in part medication related, per RN). Pt is also very thirsty, continuously asking for liquids. Pt took meds with RN without reported difficulty, but as meal continued, she started to have increased throat clearing, particularly with solid intake. Frequent eructation was noted with liquid consumption. All of this further leads me to suspect that this is largely esophageal in nature, although also given her lethargy, I did encourage her to take a break from her meal for the time being. I think she will still require esophageal and aspiration precautions, although we will also need to monitor her mentation to take advantage of the more appropriate times for her to be eating/drinking. Will continue to follow briefly for tolerance.    HPI HPI: Breiana Stratmann. Broweris a 66 y.o.femalewith medical history significant for poorly controlled hypertension, GERD, stage III chronic kidney disease, bronchitis, diabetes mellitus, peripheral neuropathy and hypothyroidism admitted with shortness of breath. Found to have a BNP of 1700 with a new diagnosis of CHF. CXR continued bilateral effusions, right greater the left with bibasilar atelectasis or infiltrates.  Per MD note her chest pressure/ache is described as located midsternally with some radiation in all direction and  feeling like something is stuck in her throat. Pt coded 10-Apr-2023 and intubated and extubated 9/19. BSE 03/13/17 with suspected esophageal dysphagia (sope with GI 10 yrs ago), recommended to follow up with GI as outpatient, regular and thin recommended. Recent dental  extraction (upper).      SLP Plan  Continue with current plan of care       Recommendations  Diet recommendations: Dysphagia 2 (fine chop);Thin liquid Liquids provided via: Straw;Cup Medication Administration: Whole meds with liquid Supervision: Intermittent supervision to cue for compensatory strategies;Patient able to self feed;Staff to assist with self feeding Compensations: Minimize environmental distractions;Small sips/bites;Slow rate Postural Changes and/or Swallow Maneuvers: Seated upright 90 degrees;Upright 30-60 min after meal                Oral Care Recommendations: Oral care BID Follow up Recommendations: None SLP Visit Diagnosis: Dysphagia, unspecified (R13.10) Plan: Continue with current plan of care       GO                Germain Osgood 03/30/2017, 10:25 AM  Germain Osgood, M.A. CCC-SLP [phone]

## 2017-03-30 NOTE — Progress Notes (Signed)
   03/30/17 1600  Clinical Encounter Type  Visited With Patient  Visit Type Initial  Consult/Referral To Nurse  Spiritual Encounters  Spiritual Needs Prayer  Stress Factors  Patient Stress Factors None identified  Family Stress Factors None identified   Nurse suggested that Ms. Hassan would probably like a visit. Visited with patient. Mentioned wanting to call Nicole Kindred her son?  Looked on facesheet and no one named Nicole Kindred was there.  Prayer for peace and comfort from pain. Conard Novak, Chaplain

## 2017-03-30 NOTE — Progress Notes (Signed)
Patient ID: Jocelyn Hill. Brandenburger, female   DOB: 1950-07-28, 66 y.o.   MRN: [id number] Chart reviewed.   Creatinine remains elevated, now has nephrostomy tube.   She has paroxysmal atrial fibrillation but will need to remain off anticoagulation for now with RP hematoma.  Eventually should restart likely on Eliquis but would wait at least 2-3 weeks.   Eventually would like to get her to MV surgery but needs significant medical stabilization.   I do not have much additional to offer at this point, will follow at a distance.   [name] 03/30/2017

## 2017-03-30 NOTE — Progress Notes (Signed)
[facility] Progress Note  Subjective:   Excellent UOP from foley, less outpt from perc Pt screaming "I need to pee" RN says has been screaming all doay Draining clear urine from foley Cherry colored urine from perc tube   Vitals:   03/30/17 0415 03/30/17 0818 03/30/17 1200 03/30/17 1500  BP:   (!) 161/77 (!) 150/76  Pulse:   67 65  Resp:   (!) 21 19  Temp: 97.6 F (36.4 C) 98.3 F (36.8 C)    TempSrc: Oral Oral    SpO2:   95% 98%  Weight: 60.3 kg (132 lb 15 oz)     Height:       Exam: On nasal O2, crying in pain Just had large stool Lying on left side  RRR no RG, 3/6 sem Abd soft ntnd Ext no LE edema NF, follows commands but cries easily R sided perc tube (cherry urine), foley (clear yellow urine)  Inpatient medications:  . amiodarone  100 mg Oral Daily  . amLODipine  5 mg Oral Daily  . aspirin  81 mg Oral Daily  . furosemide  40 mg Intravenous Q12H  . hydrALAZINE  75 mg Oral Q8H  . insulin aspart  0-15 Units Subcutaneous TID WC  . [START ON 03/31/2017] insulin glargine  4 Units Subcutaneous Daily  . levothyroxine  112 mcg Oral QAC breakfast  . mouth rinse  15 mL Mouth Rinse BID  . pantoprazole  40 mg Oral Daily  . polyethylene glycol  17 g Oral BID  . QUEtiapine  12.5 mg Oral QHS  . senna-docusate  1 tablet Oral BID  . sodium chloride flush  10-40 mL Intracatheter Q12H  . vitamin C  250 mg Oral BID   . cefTRIAXone (ROCEPHIN)  IV Stopped (03/29/17 1859)   acetaminophen **OR** acetaminophen, bisacodyl, fentaNYL (SUBLIMAZE) injection, hydrALAZINE, levalbuterol, lidocaine (PF), LORazepam, ondansetron (ZOFRAN) IV, sodium chloride flush   Recent Labs Lab 03/28/17 0548 03/28/17 2330 03/29/17 1156 03/30/17 0603  NA 134* 127* 132* 138  K 5.1 4.1 3.6 3.7  CL 99* 94* 98* 97*  CO2 21* 21* 22 24  GLUCOSE 145* 147* 186* 115*  BUN 53* 51* 52* 49*  CREATININE 4.92* 5.00* 4.64* 4.36*  CALCIUM 8.9 8.2* 8.2* 8.9  PHOS 5.0*  --  4.2 4.1    Recent  Labs Lab 03/25/17 0433 03/26/17 0953  03/28/17 2330 03/29/17 1156 03/30/17 0603  AST 46* 39  --  48*  --   --   ALT 193* 121*  --  90*  --   --   ALKPHOS 120 109  --  129*  --   --   BILITOT 1.1 1.0  --  1.4*  --   --   PROT 5.6* 5.8*  --  5.7*  --   --   ALBUMIN 3.1* 2.7*  < > 2.7* 2.5* 2.6*  < > = values in this interval not displayed.  Recent Labs Lab 03/29/17 1156 03/29/17 1910 03/30/17 0616  WBC 15.4* 13.4* 10.2  HGB 6.8* 7.8* 9.5*  HCT 20.1* 23.2* 28.2*  MCV 76.7* 78.1 78.3  PLT 145* 149* 148*   Iron/TIBC/Ferritin/ %Sat    Component Value Date/Time   IRON 62 03/20/2017 0406   TIBC 256 03/20/2017 0406   FERRITIN 148 03/20/2017 0406   IRONPCTSAT 24 03/20/2017 0406     Impression: 66 yo female h/o stroke, COPD,  DM, HTN, HLD, hypothyroidism, anxiety, depression, DJD, CAD, afib, prior MI's. Presented to [facility]  9/13 CHF. BL creatinine 2.1 by report, 3.2 on admission, transferred here b/o renal failure. Acute on chronic dCHF, severe mitral regurgitation. Atrophic L kidney, large RP hematoma obstructing R kidney requiring perc tube.   1. Acute on CKD 4 -  ( baseline- reported around 2.1) - as low as 2.73 this admission. R kidney was obstructed partially, left is atrophic. Creatinine peaked at 5, perc tube in since 9/28, trending down slowly. (In need of heart cath and valve replacement; with CKD stage IV bordering stage V needed higher doses of lasix  to stabilize her first and responded. Then had retroperitoneal hematoma causing obstruction of her solitary functioning R kidney) Urology called s/p nephrostomy tube on 9/28. Putting out urine, renal function trending  back better now that obstruction is relieved  2. Retroperitoneal hematoma - large R. Hydro R. (atrophic left kidney). Good UOP via foley, less output from perc tube. 3. Pulm edema - resolved 4. Severe MR - per cards- sounds like cardiac cath and surgical intervention down the line- needs rehab 5. HTN - bp's high, vol  excess- also on norvasc 10 and hydralazine 75 q 8 (increased)  - lasix restarted 40 BID 6. Hx CVA/ depression/ chronic pain- exacerbated now with retroperitoneal bleed 7. Anemia- giving iron IV - RPBleed- hgb drop- heparin stopped - cont to follow for transfusion need- hgb up to 9.5 today.  8. Elevated WBC- unsure etiology- cultures ordered, now on zosyn - is improving   Jamal Maes, MD [place] Pager 03/30/2017, 4:30 PM

## 2017-03-30 NOTE — Consult Note (Signed)
DENTAL CONSULTATION  Date of Consultation:  03/30/2017 Patient Name:   Jocelyn Hill. Furukawa Date of Birth:   [birth year] Medical Record Number: [id number]  VITALS: BP 125/60   Pulse (!) 53   Temp 98.3 F (36.8 C) (Oral)   Resp 19   Ht 5\' 9"  (1.753 m)   Wt 132 lb 15 oz (60.3 kg)   SpO2 91%   BMI 19.63 kg/m   CHIEF COMPLAINT: Patient referred by Dr. Roxy Manns for a dental consultation.   HPI: Lianny Molter. Nave is a 66 year old female recently diagnosed with severe mitral regurgitation with significant medical comorbidities. Patient with possible mitral valve replacement surgery in the future.  The patient is now seen as part of a pre-heart valve surgery dental protocol examination to rule out dental infection that may affect the patient's systemic health and anticipated heart valve surgery.  The patient currently denies acute toothaches, swellings, or abscesses. Patient indicates that "all my front teeth are crumbling". Patient has not seen a dentist for more than 5 years.  Patient denies having partial dentures.  Patient denies having dental phobia.   PROBLEM LIST: Patient Active Problem List   Diagnosis Date Noted  . Substance abuse ([place])   . Acute diastolic CHF (congestive heart failure) ([place])   . Pulmonary hypertension ([place])   . Asystole ([place])   . Non-rheumatic mitral regurgitation   . Acute renal failure superimposed on stage 3 chronic kidney disease ([place])   . Anemia due to chronic kidney disease   . Controlled diabetes mellitus type 2 with complications ([place])   . Acute encephalopathy   . Severe mitral regurgitation   . CKD (chronic kidney disease), stage IV ([place])   . 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 Microcytic anemia 03/13/2017  . Hyperlipidemia 03/12/2017  . Hypertension 03/12/2017  . Hypothyroidism 03/12/2017  . Diet-controlled diabetes mellitus ([place]) 03/12/2017  . Acute diastolic (congestive) heart failure ([place]) 03/12/2017  . AKI (acute kidney injury) ([place]) 03/12/2017  .  Depression 03/12/2017  . CKD (chronic kidney disease), stage III ([place]) 03/12/2017  . CHF (congestive heart failure) ([place]) 03/12/2017  . Dysphagia 03/12/2017  . Acute on chronic respiratory failure with hypoxia ([place]) 03/12/2017    PMH: Past Medical History:  Diagnosis Date  . Anxiety   . Arthritis   . Bronchitis   . Depression   . Diabetes mellitus without complication ([place])   . Diet-controlled diabetes mellitus ([place])   . GERD (gastroesophageal reflux disease)   . Hyperlipidemia   . Hypertension   . Hypothyroidism   . Stroke [facility])     PSH: Past Surgical History:  Procedure Laterality Date  . ABDOMINAL HYSTERECTOMY     PARTIALS  . IR NEPHROSTOMY PLACEMENT RIGHT  03/27/2017  . IR THORACENTESIS ASP PLEURAL SPACE W/IMG GUIDE  03/24/2017  . TONSILLECTOMY    . TUBAL LIGATION      ALLERGIES: Allergies  Allergen Reactions  . Ace Inhibitors Anaphylaxis and Swelling    MEDICATIONS: Current Facility-Administered Medications  Medication Dose Route Frequency Provider Last Rate Last Dose  . acetaminophen (TYLENOL) tablet 500 mg  500 mg Oral Q6H PRN Cherene Altes, MD       Or  . acetaminophen (TYLENOL) suppository 325 mg  325 mg Rectal Q6H PRN Cherene Altes, MD      . amiodarone (PACERONE) tablet 100 mg  100 mg Oral Daily Larey Dresser, MD   100 mg at 03/30/17 1002  . amLODipine (NORVASC) tablet 5 mg  5 mg Oral  Daily Larey Dresser, MD   5 mg at 03/30/17 1002  . aspirin chewable tablet 81 mg  81 mg Oral Daily Cherene Altes, MD   81 mg at 03/30/17 1002  . bisacodyl (DULCOLAX) suppository 10 mg  10 mg Rectal Daily PRN Jennelle Human B, NP   10 mg at 03/22/17 1255  . cefTRIAXone (ROCEPHIN) 2 g in dextrose 5 % 50 mL IVPB  2 g Intravenous Q24H Cherene Altes, MD   Stopped at 03/29/17 1859  . fentaNYL (SUBLIMAZE) injection 50 mcg  50 mcg Intravenous Q1H PRN Opyd, Ilene Qua, MD   50 mcg at 03/30/17 1243  . furosemide (LASIX) injection 40 mg  40 mg Intravenous Q12H  Corliss Parish, MD   40 mg at 03/30/17 1236  . hydrALAZINE (APRESOLINE) injection 10 mg  10 mg Intravenous Q4H PRN Shirley Friar, PA-C   10 mg at 03/26/17 2536  . hydrALAZINE (APRESOLINE) tablet 75 mg  75 mg Oral Q8H Clegg, Amy D, NP   75 mg at 03/30/17 0606  . insulin aspart (novoLOG) injection 0-15 Units  0-15 Units Subcutaneous TID WC Allie Bossier, MD   2 Units at 03/30/17 [phone]  . insulin glargine (LANTUS) injection 5 Units  5 Units Subcutaneous Daily Allie Bossier, MD   5 Units at 03/30/17 1002  . levalbuterol (XOPENEX) nebulizer solution 1.25 mg  1.25 mg Nebulization Q3H PRN Cherene Altes, MD      . levothyroxine (SYNTHROID, LEVOTHROID) tablet 112 mcg  112 mcg Oral QAC breakfast Cherene Altes, MD   112 mcg at 03/30/17 1001  . lidocaine (PF) (XYLOCAINE) 1 % injection    PRN Aletta Edouard, MD   5 mL at 03/27/17 1536  . LORazepam (ATIVAN) injection 1-2 mg  1-2 mg Intravenous Q4H PRN Cherene Altes, MD   2 mg at 03/30/17 0841  . MEDLINE mouth rinse  15 mL Mouth Rinse BID Allie Bossier, MD   15 mL at 03/30/17 1003  . ondansetron (ZOFRAN) injection 4 mg  4 mg Intravenous Q6H PRN Desai, Rahul P, PA-C   4 mg at 03/26/17 0326  . pantoprazole (PROTONIX) EC tablet 40 mg  40 mg Oral Daily Allie Bossier, MD   40 mg at 03/30/17 1002  . polyethylene glycol (MIRALAX / GLYCOLAX) packet 17 g  17 g Oral BID Cherene Altes, MD   17 g at 03/30/17 1003  . QUEtiapine (SEROQUEL) tablet 12.5 mg  12.5 mg Oral QHS Cherene Altes, MD   12.5 mg at 03/29/17 2122  . senna-docusate (Senokot-S) tablet 1 tablet  1 tablet Oral BID Cherene Altes, MD   1 tablet at 03/30/17 1002  . sodium chloride flush (NS) 0.9 % injection 10-40 mL  10-40 mL Intracatheter Q12H Hammonds, Sharyn Blitz, MD   10 mL at 03/30/17 1003  . sodium chloride flush (NS) 0.9 % injection 10-40 mL  10-40 mL Intracatheter PRN Hammonds, Sharyn Blitz, MD      . vitamin C (ASCORBIC ACID) tablet 250 mg  250 mg Oral BID  Allie Bossier, MD   250 mg at 03/30/17 1002    LABS: Lab Results  Component Value Date   WBC 10.2 03/30/2017   HGB 9.5 (L) 03/30/2017   HCT 28.2 (L) 03/30/2017   MCV 78.3 03/30/2017   PLT 148 (L) 03/30/2017      Component Value Date/Time   NA 138 03/30/2017 0603   K 3.7 03/30/2017 0603  CL 97 (L) 03/30/2017 0603   CO2 24 03/30/2017 0603   GLUCOSE 115 (H) 03/30/2017 0603   BUN 49 (H) 03/30/2017 0603   CREATININE 4.36 (H) 03/30/2017 0603   CALCIUM 8.9 03/30/2017 0603   GFRNONAA 10 (L) 03/30/2017 0603   GFRAA 11 (L) 03/30/2017 0603   Lab Results  Component Value Date   INR 1.34 03/26/2017   INR 1.33 03/26/2017   INR 1.16 03/24/2017   No results found for: PTT  SOCIAL HISTORY: Social History   Social History  . Marital status: Divorced    Spouse name: N/A  . Number of children: N/A  . Years of education: N/A   Occupational History  . Not on file.   Social History Main Topics  . Smoking status: Current Some Day Smoker    Types: Cigarettes  . Smokeless tobacco: Never Used  . Alcohol use No  . Drug use: No  . Sexual activity: No   Other Topics Concern  . Not on file   Social History Narrative  . No narrative on file    FAMILY HISTORY: History reviewed. No pertinent family history.  REVIEW OF SYSTEMS: Reviewed with the patient as per History of present illness. Psych: Patient denies having dental phobia.  DENTAL HISTORY: CHIEF COMPLAINT: Patient referred by Dr. Roxy Manns for a dental consultation.   HPI: Dorota Heinrichs. Jocelyn Hill is a 66 year old female recently diagnosed with severe mitral regurgitation with significant medical comorbidities. Patient with possible mitral valve replacement surgery in the future.  The patient is now seen as part of a pre-heart valve surgery dental protocol examination to rule out dental infection that may affect the patient's systemic health and anticipated heart valve surgery.  The patient currently denies acute toothaches,  swellings, or abscesses. Patient indicates that "all my front teeth are crumbling". Patient has not seen a dentist for more than 5 years.  Patient denies having partial dentures.  Patient denies having dental phobia.   DENTAL EXAMINATION: GENERAL:Patient is a well-developed, well-nourished female in no acute distress. HEAD AND NECK:  There is no apparent neck lymphadenopathy. Patient denies acute TMJ symptoms.  INTRAORAL EXAM: The patient has xerostomia. There is no evidence of oral abscess formation.  DENTITION: Patient has multiple missing teeth. Patient has multiple retained root segments of tooth numbers 4, 8, 9, 10, 12, and 14.  PERIODONTAL: The patient has chronic periodontitis with plaque and calculus accumulations, selective areas of gingival recession, and incipient to moderate bone loss.  DENTAL CARIES/SUBOPTIMAL RESTORATIONS: Multiple dental caries are noted.  ENDODONTIC: The patient currently denies acute pulpitis symptoms. Patient has had previous root canal therapies of multiple maxillary teeth. These root canal therapies are now exposed to the oral environment. The post portion of tooth #8 is now exposed to the oral environment. Multiple periapical radiolucencies are noted. CROWN AND BRIDGE: There are no crown restorations noted.  PROSTHODONTIC:Patient denies having partial dentures. OCCLUSION: Patient has a poor occlusal scheme secondary to multiple missing teeth, multiple retained root segments, and lack of replacement of missing teeth with dental prostheses.   RADIOGRAPHIC INTERPRETATION: Orthopantogram was taken on 03/23/2017. There are multiple missing teeth. There multiple retained root segments. There is supra-eruption and drifting of the unopposed teeth into the edentulous areas. Multiple dental caries are noted. Patient has multiple root canal therapies that are now exposed to the oral environment. Multiple areas of periapical pathology and radiolucency are noted.     ASSESSMENTS: 1. Severe mitral regurgitation 2. Pre-heart valve surgery dental protocol 3.  Multiple medical comorbidities 4. Multiple dental caries 5. Multiple retained root segments 6. Chronic periodontitis of bone loss 7. Accretions 8. Gingival recession 9. Multiple missing teeth 10. Supra-eruption and drifting of the unopposed teeth into the edentulous areas 11. No history of partial dentures 12. Poor occlusal scheme and malocclusion 13. Risk for complications with invasive dental procedures up to and including death due to patient's overall cardiovascular, respiratory, and medical compromise   PLAN/RECOMMENDATIONS: 1. I discussed the risks, benefits, and complications of various treatment options with the patient in relationship to her medical and dental conditions, anticipated heart valve surgery, and  risk for endocarditis. We discussed various treatment options to include no treatment, multiple extractions with alveoloplasty, pre-prosthetic surgery as indicated, periodontal therapy, dental restorations, root canal therapy, crown and bridge therapy, implant therapy, and replacement of missing teeth as indicated. The patient currently wishes to think about her options but may wish to proceed with extraction of tooth numbers 4, 8, 9, 10, 12, and 14 with alveoloplasty and gross debridement of the remaining dentition in the operating with general anesthesia.  Prior to that, however, the patient will need to medically stable for these dental procedures. I will follow patient along with cardiology and TCTS and discuss the plan of care with the patient once the patient is deemed medically stable for the dental procedures and remains a candidate for heart valve surgery..   2. Discussion of findings with medical team and coordination of future medical and dental care as needed.    [name], DDS

## 2017-03-30 NOTE — Evaluation (Signed)
Occupational Therapy Evaluation Patient Details Name: Jocelyn Hill. Jocelyn Hill MRN: [id number] DOB: [birth year] Today's Date: 03/30/2017    History of Present Illness Pt adm with Acute Respiratory failure with Hypoxia due to pulm edema and B Pleural effusions and Acute Diastolic CHF. Now with new retroperitoneal bleed. s/p right nephrostomy tube 9/29. PMH - ckd, dm, depression, cva   Clinical Impression   PTA, pt was independent with ADL and functional mobility. She currently requires mod assist for LB ADL, mod assist +2 for sit<>stand pulling up on recliner in preparation for ADL transfers, and max assist for toileting hygiene tasks. She presents with confusion, requiring max cues to follow commands at times, generalized weakness, and back pain impacting her ability to participate in ADL at [facility]. She would benefit from continued OT services while admitted to improve independence with ADL and functional mobility. Recommend SNF level rehabilitation post-acute D/C.    Follow Up Recommendations  SNF;Supervision/Assistance - 24 hour    Equipment Recommendations  Other (comment) (TBD at next venue of care)    Recommendations for Other Services       Precautions / Restrictions Precautions Precautions: Fall Restrictions Weight Bearing Restrictions: No      Mobility Bed Mobility Overal bed mobility: Needs Assistance Bed Mobility: Rolling;Sidelying to Sit;Sit to Sidelying Rolling: Min assist Sidelying to sit: Min assist;HOB elevated     Sit to sidelying: Mod assist;HOB elevated General bed mobility comments: Able to bring LEs to EOB, assist to elevate trunk, use of rail. Needed assist to bring LEs into bed to return to supine.   Transfers Overall transfer level: Needs assistance Equipment used: 2 person hand held assist Transfers: Sit to/from Stand Sit to Stand: Mod assist;+2 physical assistance         General transfer comment: Assist to power to standing with pt pulling up on back of  recliner, performed x1. Pt with bil knee buckling. Cues for upright posture.     Balance Overall balance assessment: Needs assistance Sitting-balance support: Feet supported;No upper extremity supported Sitting balance-Leahy Scale: Poor Sitting balance - Comments: Sitting EOB but falling forward unexpectedly; Needs Min guard-Min A for support. Collapsing on therapist's belly for rest. Does better with UE support.      Standing balance-Leahy Scale: Poor Standing balance comment: Reliant on BUE supported on chair and Mod A for support due to bil knees buckling. Able to stand ~1 min                           ADL either performed or assessed with clinical judgement   ADL Overall ADL's : Needs assistance/impaired Eating/Feeding: Sitting;Supervision/ safety Eating/Feeding Details (indicate cue type and reason): Cues to continue with activity Grooming: Min guard;Sitting   Upper Body Bathing: Minimal assistance;Sitting   Lower Body Bathing: Moderate assistance;Sit to/from stand   Upper Body Dressing : Minimal assistance;Sitting   Lower Body Dressing: Moderate assistance;Sit to/from stand Lower Body Dressing Details (indicate cue type and reason): Able to don socks with min assist. Mod assist to pull up pants in standing.    Toilet Transfer Details (indicate cue type and reason): Able to complete sit<> stand with mod assist +2 and B UE support on back of recliner. Pt unable to complete pivot due to B knee buckling.  Toileting- Clothing Manipulation and Hygiene: Maximal assistance;Sit to/from stand         General ADL Comments: Pt at times coherent and at other times overwhelmed with situation. With max  encouragement, able to participate.      Vision   Additional Comments: Need to continue assessment.      Perception     Praxis Praxis Praxis-Other Comments: Need further assessment.     Pertinent Vitals/Pain Pain Assessment: Faces Faces Pain Scale: Hurts even  more Pain Location: generalized; back Pain Descriptors / Indicators: Moaning Pain Intervention(s): Monitored during session;Limited activity within patient's tolerance     Hand Dominance     Extremity/Trunk Assessment Upper Extremity Assessment Upper Extremity Assessment: Generalized weakness   Lower Extremity Assessment Lower Extremity Assessment: Generalized weakness       Communication Communication Communication: No difficulties   Cognition Arousal/Alertness: Awake/alert Behavior During Therapy: Restless;Flat affect Overall Cognitive Status: No family/caregiver present to determine baseline cognitive functioning                                 General Comments: Max cues to follow one-step commands at times. Requires repetition to perform tasks and to answer questions appropriately. Needs verbal stimulus initially due to being sleepy. Moaning throughout session. " you guys are so mean."    General Comments  Pt with tachycardia in 120s bpm.    Exercises     Shoulder Instructions      Home Living Family/patient expects to be discharged to:: Private residence Living Arrangements: Alone                           Home Equipment: None   Additional Comments: Pt unable to answer PLOF questions at this time.       Prior Functioning/Environment Level of Independence: Independent        Comments: drives        OT Problem List: Decreased strength;Decreased activity tolerance;Impaired balance (sitting and/or standing);Decreased safety awareness;Decreased knowledge of use of DME or AE;Decreased knowledge of precautions;Decreased cognition      OT Treatment/Interventions: Self-care/ADL training;Therapeutic exercise;Energy conservation;DME and/or AE instruction;Therapeutic activities;Patient/family education;Balance training;Cognitive remediation/compensation    OT Goals(Current goals can be found in the care plan section) Acute Rehab OT  Goals Patient Stated Goal: not stated OT Goal Formulation: With patient Time For Goal Achievement: 04/13/17 Potential to Achieve Goals: Fair ADL Goals Pt Will Perform Grooming: with set-up;sitting Pt Will Perform Upper Body Dressing: with set-up;sitting Pt Will Perform Lower Body Dressing: with min guard assist;sit to/from stand Pt Will Transfer to Toilet: with min assist;stand pivot transfer;bedside commode Pt Will Perform Toileting - Clothing Manipulation and hygiene: sit to/from stand;with min assist Additional ADL Goal #1: Pt will follow 3/4 one-step commands with no more than 1 VC.   OT Frequency: Min 2X/week   Barriers to D/C:            Co-evaluation PT/OT/SLP Co-Evaluation/Treatment: Yes Reason for Co-Treatment: Necessary to address cognition/behavior during functional activity;For patient/therapist safety          AM-PAC PT "6 Clicks" Daily Activity     Outcome Measure Help from another person eating meals?: A Little Help from another person taking care of personal grooming?: A Little Help from another person toileting, which includes using toliet, bedpan, or urinal?: A Lot Help from another person bathing (including washing, rinsing, drying)?: A Lot Help from another person to put on and taking off regular upper body clothing?: A Little Help from another person to put on and taking off regular lower body clothing?: A Lot 6 Click Score: 15  End of Session Nurse Communication: Mobility status  Activity Tolerance: Patient tolerated treatment well Patient left: in bed;with call bell/phone within reach  OT Visit Diagnosis: Other abnormalities of gait and mobility (R26.89);Muscle weakness (generalized) (M62.81);Other symptoms and signs involving cognitive function                Time: 8307-3543 OT Time Calculation (min): 23 min Charges:  OT General Charges $OT Visit: 1 Visit OT Evaluation $OT Eval Moderate Complexity: 1 Mod G-Codes:     Norman Herrlich, MS  OTR/L  Pager: [place] 03/30/2017, 3:12 PM

## 2017-03-30 NOTE — Care Management Important Message (Signed)
Important Message  Patient Details  Name: Jocelyn Hill. Prochazka MRN: [id number] Date of Birth: [birth year]   Medicare Important Message Given:  Yes    [place] Maiorana Abena 03/30/2017, 9:22 AM

## 2017-03-31 LAB — CBC
HCT: 25.8 % — ABNORMAL LOW (ref 36.0–46.0)
Hemoglobin: 8.9 g/dL — ABNORMAL LOW (ref 12.0–15.0)
MCH: 27.1 pg (ref 26.0–34.0)
MCHC: 34.5 g/dL (ref 30.0–36.0)
MCV: 78.7 fL (ref 78.0–100.0)
Platelets: 194 10*3/uL (ref 150–400)
RBC: 3.28 MIL/uL — ABNORMAL LOW (ref 3.87–5.11)
RDW: 16.5 % — ABNORMAL HIGH (ref 11.5–15.5)
WBC: 10.8 10*3/uL — ABNORMAL HIGH (ref 4.0–10.5)

## 2017-03-31 LAB — RENAL FUNCTION PANEL
Albumin: 2.6 g/dL — ABNORMAL LOW (ref 3.5–5.0)
Anion gap: 14 (ref 5–15)
BUN: 43 mg/dL — ABNORMAL HIGH (ref 6–20)
CO2: 24 mmol/L (ref 22–32)
Calcium: 8.5 mg/dL — ABNORMAL LOW (ref 8.9–10.3)
Chloride: 99 mmol/L — ABNORMAL LOW (ref 101–111)
Creatinine, Ser: 4.11 mg/dL — ABNORMAL HIGH (ref 0.44–1.00)
GFR calc Af Amer: 12 mL/min — ABNORMAL LOW (ref >60)
GFR calc non Af Amer: 10 mL/min — ABNORMAL LOW (ref >60)
Glucose, Bld: 119 mg/dL — ABNORMAL HIGH (ref 65–99)
Phosphorus: 3.3 mg/dL (ref 2.5–4.6)
Potassium: 2.7 mmol/L — CL (ref 3.5–5.1)
Sodium: 137 mmol/L (ref 135–145)

## 2017-03-31 LAB — CULTURE, BLOOD (ROUTINE X 2)
Culture: NO GROWTH
Special Requests: ADEQUATE

## 2017-03-31 LAB — GLUCOSE, CAPILLARY
Glucose-Capillary: 115 mg/dL — ABNORMAL HIGH (ref 65–99)
Glucose-Capillary: 167 mg/dL — ABNORMAL HIGH (ref 65–99)
Glucose-Capillary: 120 mg/dL — ABNORMAL HIGH (ref 65–99)
Glucose-Capillary: 144 mg/dL — ABNORMAL HIGH (ref 65–99)

## 2017-03-31 MED ORDER — LORAZEPAM 2 MG/ML IJ SOLN
1.0000 mg | INTRAMUSCULAR | Status: DC | PRN
  Administered 2017-04-01 – 2017-04-04 (×7): 1 mg via INTRAVENOUS
  Filled 2017-03-31 (×7): qty 1

## 2017-03-31 MED ORDER — POTASSIUM CHLORIDE 10 MEQ/100ML IV SOLN
10.0000 meq | INTRAVENOUS | Status: AC
  Administered 2017-03-31 (×5): 10 meq via INTRAVENOUS
  Filled 2017-03-31 (×3): qty 100

## 2017-03-31 MED ORDER — METOPROLOL TARTRATE 5 MG/5ML IV SOLN
5.0000 mg | Freq: Four times a day (QID) | INTRAVENOUS | Status: DC
  Administered 2017-03-31 – 2017-04-02 (×7): 5 mg via INTRAVENOUS
  Filled 2017-03-31 (×9): qty 5

## 2017-03-31 NOTE — Progress Notes (Signed)
CRITICAL VALUE ALERT  Critical Value:  K+ 2.7  Date & Time Notied:  03/31/2017 0520  Provider Notified: Lamar Blinks NP  Orders Received/Actions taken: K+ runs x 5

## 2017-03-31 NOTE — Clinical Social Work Note (Signed)
Clinical Social Work Assessment  Patient Details  Name: Jocelyn Hill. [place] MRN: [id number] Date of Birth: 1950/10/02  Date of referral:  03/31/17               Reason for consult:  Facility Placement                Permission sought to share information with:  Facility Sport and exercise psychologist, Family Supports Permission granted to share information::  Yes, Verbal Permission Granted  Name::     Psychiatric nurse::  SNFs  Relationship::  Daughter  Contact Information:  [phone]  Housing/Transportation Living arrangements for the past 2 months:  Apartment Source of Information:  Patient, Other (Comment Required) Development worker, international aid ) Patient Interpreter Needed:  None Criminal Activity/Legal Involvement Pertinent to Current Situation/Hospitalization:  No - Comment as needed Significant Relationships:  Adult Children, Other Family Members Lives with:  Self Do you feel safe going back to the place where you live?  No Need for family participation in patient care:  Yes (Comment)  Care giving concerns:  CSW received consult for possible SNF placement at time of discharge. CSW spoke with patient and cousin at bedside regarding PT recommendation of SNF placement at time of discharge. Patient reported that she lives alone. Patient expressed understanding of PT recommendation and is agreeable to SNF placement at time of discharge. CSW to continue to follow and assist with discharge planning needs.   Social Worker assessment / plan:  CSW spoke with patient concerning possibility of rehab at [facility] before returning home.  Employment status:  Retired Nurse, adult PT Recommendations:  [place] / Referral to community resources:  [place]  Patient/Family's Response to care:  Patient recognizes need for rehab before returning home and is agreeable to a SNF in [place]. Patient's cousin stated patient's daughter will look over the SNF  list.   Patient/Family's Understanding of and Emotional Response to Diagnosis, Current Treatment, and Prognosis:  Patient/family is realistic regarding therapy needs and expressed being hopeful for SNF placement. Patient expressed understanding of CSW role and discharge process as well as medical condition. Patient is in a lot of pain and states she is unable to go to sleep. RN stated she would provided medication. No questions/concerns about plan or treatment.    Emotional Assessment Appearance:  Appears older than stated age Attitude/Demeanor/Rapport:  Complaining Affect (typically observed):  Restless (In pain) Orientation:  Oriented to Self, Oriented to Situation, Oriented to Place, Oriented to  Time Alcohol / Substance use:  Not Applicable Psych involvement (Current and /or in the community):  No (Comment)  Discharge Needs  Concerns to be addressed:  Care Coordination Readmission within the last 30 days:  No Current discharge risk:  Lives alone Barriers to Discharge:  Continued Medical Work up   [employer], [place] 03/31/2017, 11:17 AM

## 2017-03-31 NOTE — Progress Notes (Signed)
Jocelyn Hill  BSJ:[id number] DOB: 1951-01-22 DOA: 03/12/2017 PCP: [name], MD    Brief Narrative:  66yo F w/ a hx of anxiety, depression, CVA, poorly controlled HTN, CKD stage III, DM2 with peripheral neuropathy, and Hypothyroidism who presented w/ a month of gradually worsening dyspnea.  She initially presented to the ED at [facility] where she was found to have a BNP of 1700.  CXR noted moderate bilateral pleural effusions. Because of worsening creatinine and decompensated CHF, it was felt best to transfer her to [facility].   Subjective: The pt is alert but confused at the time of my visit.  She is not able to provide a reliable hx, and answers "yes" to literally every element of the ROS I present to her.  She does not appear to be in any acute resp distress.    Assessment & Plan:  Acute Respiratory failure with Hypoxia due to pulm edema and B Pleural effusions  R thoracentesis 9/25 yielded ~350cc of fluid (exudate per LDH, but transudate per protein) - has made significant progress over the last 24 hours but the patient no longer requiring supplemental oxygen  Large Right retroperitoneal hematoma - acute blood loss anemia in setting of chronic anemia due to CKD Noted via CT abdom 9/25 - transfused 2U PRBC - Hgb stable - hematoma stable on f/u CT 9/27   Recent Labs Lab 03/27/17 1810 03/28/17 0548 03/29/17 1156 03/29/17 1910 03/30/17 0616 03/31/17 0416  HGB 7.4* 7.4* 6.8* 7.8* 9.5* 8.9*    Acute renal failure on CKD stage III - R obstructive hydronephrosis  Care as per Nephrology - baseline crt ~2.1 - acute failure due to obstructive hydronephrosis related to [facility] - status post percutaneous nephrostomy tube 9/28 - crt appears to be falling at this time - very little output per nephrostomy tube - decision on when to DC the tube per nephrology and IR but it appears it is not benefiting as further at this time  Recent Labs Lab  03/28/17 0548 03/28/17 2330 03/29/17 1156 03/30/17 0603 03/31/17 0416  CREATININE 4.92* 5.00* 4.64* 4.36* 4.11*    E coli and Kleb pneumo pyelonephritis w/ E coli bacteremia In setting of acute obstructive hydronephrosis - cont Rocephin to assure E coli and Kleb both adequately covered - clinical indicators of infxn are improving - follow fever curve and WBC  Acute Diastolic CHF ongoing diuresis w/ IV lasix - net negative ~20 L at this time  [facility] Weights   03/29/17 0432 03/30/17 0415 03/31/17 0434  Weight: 58 kg (127 lb 13.9 oz) 60.3 kg (132 lb 15 oz) 59.2 kg (130 lb 8.2 oz)    Severe Mitral Valve regurgitation TCTS following, and they have placed Dental consult - for eventual valve surgery once medically stable otherwise, but will need multiple teeth extracted first   Acute encephalopathy  Multifactorial - had initial favorable response to seroquel but now appears more confused again - stop seroquel and attempt to avoid sedating meds   CAD - Elevated troponin / demand ischemia to have cardiac cath when renal fxn allows   Parox Afib/flutter To consider Maze w/ MVR - Cards has evaluated - NSR on amiodarone presently with dose decreased due to junctional rhythm - unable to anticoag at this time due to [facility]  DM2 9/13 A1c 6.2 - CBG controlled   Hypothyroidism Cont home synthroid dose   Hypokalemia  Supplement gently in setting of AKI and follow  Transamintis Likely hepatic congestion in setting of CHF - acute hepatitis panel negative - LFTs improving    DVT prophylaxis: SCDs Code Status: FULL CODE Family Communication: no family present at time of exam  Disposition Plan: SDU   Consultants:  CHF Team  Cardiology  TCTS PCCM  Procedures: 9/13 TTE - EF 60-65% - grade 2 DD - severe MR - mildly dilated LA and RA 9/18 TEE - severe regurgitation MV 9/28 R perc neph tube   Antimicrobials:  Azithromycin 9/15 > 9/17 Zosyn 9/27 Cipro 9/28  Rocephin 9/28  >  Objective: Blood pressure (!) 151/75, pulse 70, temperature 98.7 F (37.1 C), temperature source Axillary, resp. rate (!) 23, height 5\' 9"  (1.753 m), weight 59.2 kg (130 lb 8.2 oz), SpO2 94 %.  Intake/Output Summary (Last 24 hours) at 03/31/17 1701 Last data filed at 03/31/17 1550  Gross per 24 hour  Intake              870 ml  Output             5225 ml  Net            -4355 ml   Filed Weights   03/29/17 0432 03/30/17 0415 03/31/17 0434  Weight: 58 kg (127 lb 13.9 oz) 60.3 kg (132 lb 15 oz) 59.2 kg (130 lb 8.2 oz)    Examination: General: No acute respiratory distress - confused  Lungs: no wheezing or crackles B   Cardiovascular: RRR w/ unchanged 3/6 holosystolic M Abdomen: NT/ND, bowel sounds positive, no mass Extremities: No edema B lower extremities   CBC:  Recent Labs Lab 03/28/17 0548 03/29/17 1156 03/29/17 1910 03/30/17 0616 03/31/17 0416  WBC 27.8* 15.4* 13.4* 10.2 10.8*  HGB 7.4* 6.8* 7.8* 9.5* 8.9*  HCT 22.2* 20.1* 23.2* 28.2* 25.8*  MCV 76.0* 76.7* 78.1 78.3 78.7  PLT 146* 145* 149* 148* 902   Basic Metabolic Panel:  Recent Labs Lab 03/24/17 1749  03/26/17 0953 03/26/17 1500 03/26/17 2147  03/27/17 1823 03/28/17 0548 03/28/17 2330 03/29/17 1156 03/30/17 0603 03/31/17 0416  NA  --   < > 131* 130*  --   < > 130* 134* 127* 132* 138 137  K 4.1  < > 5.9* 5.9* 5.7*  < > 5.2* 5.1 4.1 3.6 3.7 2.7*  CL  --   < > 93* 94*  --   < > 97* 99* 94* 98* 97* 99*  CO2  --   < > 25 26  --   < > 23 21* 21* 22 24 24   GLUCOSE  --   < > 144* 165*  --   < > 146* 145* 147* 186* 115* 119*  BUN  --   < > 33* 36*  --   < > 50* 53* 51* 52* 49* 43*  CREATININE  --   < > 3.62* 3.61*  --   < > 4.65* 4.92* 5.00* 4.64* 4.36* 4.11*  CALCIUM  --   < > 9.7 9.3  --   < > 8.8* 8.9 8.2* 8.2* 8.9 8.5*  MG 1.8  --  1.8 1.8 1.6*  --   --   --  1.8  --   --   --   PHOS  --   --   --   --   --   --  5.4* 5.0*  --  4.2 4.1 3.3  < > = values in this interval not  displayed. GFR: Estimated Creatinine Clearance: 12.8  mL/min (A) (by C-G formula based on SCr of 4.11 mg/dL (H)).  Liver Function Tests:  Recent Labs Lab 03/25/17 0433 03/26/17 0953  03/28/17 0548 03/28/17 2330 03/29/17 1156 03/30/17 0603 03/31/17 0416  AST 46* 39  --   --  48*  --   --   --   ALT 193* 121*  --   --  90*  --   --   --   ALKPHOS 120 109  --   --  129*  --   --   --   BILITOT 1.1 1.0  --   --  1.4*  --   --   --   PROT 5.6* 5.8*  --   --  5.7*  --   --   --   ALBUMIN 3.1* 2.7*  < > 2.6* 2.7* 2.5* 2.6* 2.6*  < > = values in this interval not displayed.  Coagulation Profile:  Recent Labs Lab 03/26/17 1132 03/26/17 2147  INR 1.33 1.34    HbA1C: Hgb A1c MFr Bld  Date/Time Value Ref Range Status  03/12/2017 02:00 PM 6.2 (H) 4.8 - 5.6 % Final    Comment:    (NOTE) Pre diabetes:          5.7%-6.4% Diabetes:              >6.4% Glycemic control for   <7.0% adults with diabetes     CBG:  Recent Labs Lab 03/30/17 1724 03/30/17 2100 03/31/17 0800 03/31/17 1153 03/31/17 1548  GLUCAP 176* 129* 115* 167* 120*    Recent Results (from the past 240 hour(s))  Gram stain     Status: None   Collection Time: 03/24/17  4:23 PM  Result Value Ref Range Status   Specimen Description FLUID PLEURAL RIGHT  Final   Special Requests NONE  Final   Gram Stain   Final    WBC PRESENT, PREDOMINANTLY MONONUCLEAR NO ORGANISMS SEEN CYTOSPIN SMEAR    Report Status 03/24/2017 FINAL  Final  Fungus Culture With Stain     Status: None (Preliminary result)   Collection Time: 03/24/17  4:23 PM  Result Value Ref Range Status   Fungus Stain Final report  Final    Comment: (NOTE) Performed At: [facility] [place], [place] [id number] Lindon Romp MD QA:[id number]    Fungus (Mycology) Culture PENDING  Incomplete   Fungal Source FLUID  Final    Comment: PLEURAL RIGHT   Acid Fast Smear (AFB)     Status: None   Collection Time: 03/24/17  4:23 PM   Result Value Ref Range Status   AFB Specimen Processing Concentration  Final   Acid Fast Smear Negative  Final    Comment: (NOTE) Performed At: [facility] [address] [place], [place] [id number] Lindon Romp MD RD:[id number]    Source (AFB) FLUID  Final    Comment: PLEURAL RIGHT   Culture, body fluid-bottle     Status: None   Collection Time: 03/24/17  4:23 PM  Result Value Ref Range Status   Specimen Description FLUID PLEURAL RIGHT  Final   Special Requests BOTTLES DRAWN AEROBIC AND ANAEROBIC  Final   Culture NO GROWTH 5 DAYS  Final   Report Status 03/29/2017 FINAL  Final  Fungus Culture Result     Status: None   Collection Time: 03/24/17  4:23 PM  Result Value Ref Range Status   Result 1 Comment  Final    Comment: (NOTE) KOH/Calcofluor preparation:  no fungus observed. Performed At: [facility] [place], [place] [id number] Lindon Romp MD AS:[id number]   Urine Culture     Status: Abnormal   Collection Time: 03/26/17  8:07 AM  Result Value Ref Range Status   Specimen Description URINE, CATHETERIZED  Final   Special Requests NONE  Final   Culture (A)  Final    >=100,000 COLONIES/mL KLEBSIELLA PNEUMONIAE >=100,000 COLONIES/mL ESCHERICHIA COLI    Report Status 03/29/2017 FINAL  Final   Organism ID, Bacteria KLEBSIELLA PNEUMONIAE (A)  Final   Organism ID, Bacteria ESCHERICHIA COLI (A)  Final      Susceptibility   Escherichia coli - MIC*    AMPICILLIN 4 SENSITIVE Sensitive     CEFAZOLIN <=4 SENSITIVE Sensitive     CEFTRIAXONE <=1 SENSITIVE Sensitive     CIPROFLOXACIN <=0.25 SENSITIVE Sensitive     GENTAMICIN <=1 SENSITIVE Sensitive     IMIPENEM <=0.25 SENSITIVE Sensitive     NITROFURANTOIN <=16 SENSITIVE Sensitive     TRIMETH/SULFA >=320 RESISTANT Resistant     AMPICILLIN/SULBACTAM <=2 SENSITIVE Sensitive     PIP/TAZO <=4 SENSITIVE Sensitive     Extended ESBL NEGATIVE Sensitive     * >=100,000 COLONIES/mL ESCHERICHIA COLI    Klebsiella pneumoniae - MIC*    AMPICILLIN >=32 RESISTANT Resistant     CEFAZOLIN <=4 SENSITIVE Sensitive     CEFTRIAXONE <=1 SENSITIVE Sensitive     CIPROFLOXACIN <=0.25 SENSITIVE Sensitive     GENTAMICIN <=1 SENSITIVE Sensitive     IMIPENEM <=0.25 SENSITIVE Sensitive     NITROFURANTOIN 64 INTERMEDIATE Intermediate     TRIMETH/SULFA <=20 SENSITIVE Sensitive     AMPICILLIN/SULBACTAM 4 SENSITIVE Sensitive     PIP/TAZO <=4 SENSITIVE Sensitive     Extended ESBL NEGATIVE Sensitive     * >=100,000 COLONIES/mL KLEBSIELLA PNEUMONIAE  Culture, blood (routine x 2)     Status: None   Collection Time: 03/26/17 10:40 AM  Result Value Ref Range Status   Specimen Description BLOOD LEFT HAND  Final   Special Requests IN PEDIATRIC BOTTLE Blood Culture adequate volume  Final   Culture NO GROWTH 5 DAYS  Final   Report Status 03/31/2017 FINAL  Final  Culture, blood (routine x 2)     Status: Abnormal   Collection Time: 03/26/17 10:40 AM  Result Value Ref Range Status   Specimen Description BLOOD RIGHT HAND  Final   Special Requests IN PEDIATRIC BOTTLE Blood Culture adequate volume  Final   Culture  Setup Time   Final    GRAM NEGATIVE RODS IN PEDIATRIC BOTTLE CRITICAL RESULT CALLED TO, READ BACK BY AND VERIFIED WITH: G.ABBOTT PHARMD 03/27/17 0548 L.CHAMPION    Culture ESCHERICHIA COLI (A)  Final   Report Status 03/29/2017 FINAL  Final   Organism ID, Bacteria ESCHERICHIA COLI  Final      Susceptibility   Escherichia coli - MIC*    AMPICILLIN 4 SENSITIVE Sensitive     CEFAZOLIN <=4 SENSITIVE Sensitive     CEFEPIME <=1 SENSITIVE Sensitive     CEFTAZIDIME <=1 SENSITIVE Sensitive     CEFTRIAXONE <=1 SENSITIVE Sensitive     CIPROFLOXACIN <=0.25 SENSITIVE Sensitive     GENTAMICIN <=1 SENSITIVE Sensitive     IMIPENEM <=0.25 SENSITIVE Sensitive     TRIMETH/SULFA >=320 RESISTANT Resistant     AMPICILLIN/SULBACTAM <=2 SENSITIVE Sensitive     PIP/TAZO <=4 SENSITIVE Sensitive     Extended ESBL NEGATIVE  Sensitive     * ESCHERICHIA COLI  Blood Culture ID Panel (Reflexed)     Status: Abnormal   Collection Time: 03/26/17 10:40 AM  Result Value Ref Range Status   Enterococcus species NOT DETECTED NOT DETECTED Final   Listeria monocytogenes NOT DETECTED NOT DETECTED Final   Staphylococcus species NOT DETECTED NOT DETECTED Final   Staphylococcus aureus NOT DETECTED NOT DETECTED Final   Streptococcus species NOT DETECTED NOT DETECTED Final   Streptococcus agalactiae NOT DETECTED NOT DETECTED Final   Streptococcus pneumoniae NOT DETECTED NOT DETECTED Final   Streptococcus pyogenes NOT DETECTED NOT DETECTED Final   Acinetobacter baumannii NOT DETECTED NOT DETECTED Final   Enterobacteriaceae species DETECTED (A) NOT DETECTED Final    Comment: Enterobacteriaceae represent a large family of gram-negative bacteria, not a single organism. CRITICAL RESULT CALLED TO, READ BACK BY AND VERIFIED WITH: G.ABBOTT PHARMD 03/27/17 0548 L.CHAMPION    Enterobacter cloacae complex NOT DETECTED NOT DETECTED Final   Escherichia coli DETECTED (A) NOT DETECTED Final    Comment: CRITICAL RESULT CALLED TO, READ BACK BY AND VERIFIED WITH: G.ABBOTT PHARMD 03/27/17 0548 L.CHAMPION    Klebsiella oxytoca NOT DETECTED NOT DETECTED Final   Klebsiella pneumoniae NOT DETECTED NOT DETECTED Final   Proteus species NOT DETECTED NOT DETECTED Final   Serratia marcescens NOT DETECTED NOT DETECTED Final   Carbapenem resistance NOT DETECTED NOT DETECTED Final   Haemophilus influenzae NOT DETECTED NOT DETECTED Final   Neisseria meningitidis NOT DETECTED NOT DETECTED Final   Pseudomonas aeruginosa NOT DETECTED NOT DETECTED Final   Candida albicans NOT DETECTED NOT DETECTED Final   Candida glabrata NOT DETECTED NOT DETECTED Final   Candida krusei NOT DETECTED NOT DETECTED Final   Candida parapsilosis NOT DETECTED NOT DETECTED Final   Candida tropicalis NOT DETECTED NOT DETECTED Final  Culture, Urine     Status: Abnormal    Collection Time: 03/26/17  3:38 PM  Result Value Ref Range Status   Specimen Description URINE, RANDOM  Final   Special Requests NONE  Final   Culture MULTIPLE SPECIES PRESENT, SUGGEST RECOLLECTION (A)  Final   Report Status 03/27/2017 FINAL  Final     Scheduled Meds: . amiodarone  100 mg Oral Daily  . amLODipine  5 mg Oral Daily  . aspirin  81 mg Oral Daily  . hydrALAZINE  75 mg Oral Q8H  . insulin aspart  0-15 Units Subcutaneous TID WC  . insulin glargine  4 Units Subcutaneous Daily  . levothyroxine  112 mcg Oral QAC breakfast  . mouth rinse  15 mL Mouth Rinse BID  . pantoprazole  40 mg Oral Daily  . polyethylene glycol  17 g Oral BID  . QUEtiapine  12.5 mg Oral QHS  . senna-docusate  1 tablet Oral BID  . sodium chloride flush  10-40 mL Intracatheter Q12H  . vitamin C  250 mg Oral BID     LOS: 19 days   Cherene Altes, MD [facility] Office  [phone] Pager - Text Page per Shea Evans as per below:  On-Call/Text Page:      [email or web address]      password [email or web address]  If 7PM-7AM, please contact night-coverage [email or web address] Password [email or web address] 03/31/2017, 5:01 PM

## 2017-03-31 NOTE — Progress Notes (Signed)
Received call from tele pt in vent bigeminy for about 15 min. Messaged attending MD. Pt converted back to NSR w/ frequent PVCs while EKG being set up. EKG showing NSR w/ left atrial enlargement.

## 2017-03-31 NOTE — Progress Notes (Signed)
[facility] Progress Note  Subjective:   UOP 4.2 liters/24 hours Still getting lasix IV S/p 4 runs K today for K 2.7 Urine predominantly from foley Creatinine continues to fall slowly    Vitals:   03/31/17 0809 03/31/17 0900 03/31/17 1100 03/31/17 1153  BP: (!) 158/68 (!) 167/89 (!) 168/90 (!) 130/117  Pulse: 80 80 84 (!) 40  Resp: 19 17 20 18   Temp: (!) 100.4 F (38 C)   99.6 F (37.6 C)  TempSrc: Axillary   Axillary  SpO2: 100% 100% 100% 97%  Weight:      Height:       Physical Exam: VS as noted Looks much more comfortable Regular rhythm S1S2 No S3 3/6 sem Abd soft ntnd Ext no LE edema Poor tissue turgor Large amount of urine in foley  Inpatient medications:  . amiodarone  100 mg Oral Daily  . amLODipine  5 mg Oral Daily  . aspirin  81 mg Oral Daily  . furosemide  40 mg Intravenous Q12H  . hydrALAZINE  75 mg Oral Q8H  . insulin aspart  0-15 Units Subcutaneous TID WC  . insulin glargine  4 Units Subcutaneous Daily  . levothyroxine  112 mcg Oral QAC breakfast  . mouth rinse  15 mL Mouth Rinse BID  . pantoprazole  40 mg Oral Daily  . polyethylene glycol  17 g Oral BID  . QUEtiapine  12.5 mg Oral QHS  . senna-docusate  1 tablet Oral BID  . sodium chloride flush  10-40 mL Intracatheter Q12H  . vitamin C  250 mg Oral BID   . cefTRIAXone (ROCEPHIN)  IV Stopped (03/31/17 0532)   acetaminophen **OR** acetaminophen, bisacodyl, fentaNYL (SUBLIMAZE) injection, hydrALAZINE, levalbuterol, lidocaine (PF), LORazepam, ondansetron (ZOFRAN) IV, sodium chloride flush   Recent Labs Lab 03/29/17 1156 03/30/17 0603 03/31/17 0416  NA 132* 138 137  K 3.6 3.7 2.7*  CL 98* 97* 99*  CO2 22 24 24   GLUCOSE 186* 115* 119*  BUN 52* 49* 43*  CREATININE 4.64* 4.36* 4.11*  CALCIUM 8.2* 8.9 8.5*  PHOS 4.2 4.1 3.3    Recent Labs Lab 03/25/17 0433 03/26/17 0953  03/28/17 2330 03/29/17 1156 03/30/17 0603 03/31/17 0416  AST 46* 39  --  48*  --   --   --    ALT 193* 121*  --  90*  --   --   --   ALKPHOS 120 109  --  129*  --   --   --   BILITOT 1.1 1.0  --  1.4*  --   --   --   PROT 5.6* 5.8*  --  5.7*  --   --   --   ALBUMIN 3.1* 2.7*  < > 2.7* 2.5* 2.6* 2.6*  < > = values in this interval not displayed.  Recent Labs Lab 03/29/17 1910 03/30/17 0616 03/31/17 0416  WBC 13.4* 10.2 10.8*  HGB 7.8* 9.5* 8.9*  HCT 23.2* 28.2* 25.8*  MCV 78.1 78.3 78.7  PLT 149* 148* 194   Iron/TIBC/Ferritin/ %Sat    Component Value Date/Time   IRON 62 03/20/2017 0406   TIBC 256 03/20/2017 0406   FERRITIN 148 03/20/2017 0406   IRONPCTSAT 24 03/20/2017 0406     Impression: 66 yo female h/o stroke, COPD,  DM, HTN, HLD, hypothyroidism, anxiety, depression, DJD, CAD, afib, prior MI's. Presented to [facility] 9/13 CHF. BL creatinine 2.1 by report, 3.2 on admission, transferred here b/o renal failure. Acute on chronic  dCHF, severe mitral regurgitation. Atrophic L kidney, large RP hematoma obstructing R kidney requiring perc tube.   1. Acute on CKD 4 -  ( baseline- reported around 2.1) - Creatinine peaked at 5, perc tube in since 9/28 for R hydro 2/2 RP hematoma (not draining anything now), creat now trending down slowly.  Making large amount of urine on lasix and if anything looks dry. Urine is from foley rather than PCN.  2. R Retroperitoneal hematoma noted on CT 9/25.  Hydro Right (atrophic left) - got perc tube. Good UOP via foley, minimal output from perc tube. ? Remove? 3. Pulm edema/acute diastolic HF/volume overload -Weight down >10 kg since 03/22/17 and if anything looks dry to me. Stop Lasix. K was replaced this AM 4. Severe MRegurg - per cards- sounds like cardiac cath and surgical intervention down the line  5. PAF/flutter SR now 6. Volume overload - resolved.  Stop lasix as above.  7. Hx CVA/ depression/ chronic pain 8. Anemia- giving iron IV - RPBleed- hgb drop- heparin stopped - cont to follow for transfusion need- hgb up to 9.5 today.  9. Klebs and  EColi UTI/E Coli bacteremia (9/27).  On rocephin.  10. DM2  Jamal Maes, MD [facility] [phone] Pager 03/31/2017, 3:10 PM

## 2017-03-31 NOTE — Progress Notes (Signed)
Referring Physician(s):  Dr. Moshe Cipro  Supervising Physician: Daryll Brod  Patient Status:  [facility] - In-pt  Chief Complaint: Right hydronephrosis s/p PCN placement 9/28  Subjective: Patient confused.  Cousin at bedside asking questions which are answered as able.   Allergies: Ace inhibitors  Medications: Prior to Admission medications   Medication Sig Start Date End Date Taking? Authorizing Provider  albuterol (PROVENTIL HFA;VENTOLIN HFA) 108 (90 Base) MCG/ACT inhaler Inhale 2 puffs into the lungs every 4 (four) hours as needed for wheezing or shortness of breath.   Yes [provider]  amLODipine (NORVASC) 10 MG tablet Take 10 mg by mouth daily.   Yes [provider]  aspirin EC 81 MG tablet Take 81 mg by mouth daily.   Yes [provider]  atorvastatin (LIPITOR) 40 MG tablet Take 40 mg by mouth daily.   Yes [provider]  b complex vitamins capsule Take 1 capsule by mouth daily.   Yes [provider]  buPROPion (WELLBUTRIN XL) 300 MG 24 hr tablet Take 300 mg by mouth daily.   Yes [provider]  cholecalciferol (VITAMIN D) 1000 units tablet Take 1,000 Units by mouth daily.   Yes [provider]  DULoxetine (CYMBALTA) 30 MG capsule Take 30 mg by mouth daily.   Yes [provider]  gabapentin (NEURONTIN) 300 MG capsule Take 300 mg by mouth at bedtime.   Yes [provider]  hydrALAZINE (APRESOLINE) 100 MG tablet Take 100 mg by mouth 3 (three) times daily.   Yes [provider]  labetalol (NORMODYNE) 200 MG tablet Take 200 mg by mouth 2 (two) times daily.   Yes [provider]  levothyroxine (SYNTHROID, LEVOTHROID) 112 MCG tablet Take 112 mcg by mouth daily before breakfast.   Yes [provider]  minoxidil (LONITEN) 2.5 MG tablet Take by mouth daily.   Yes [provider]  montelukast (SINGULAIR) 10 MG tablet Take 10 mg by mouth daily.   Yes [provider]  Oxycodone HCl 10 MG TABS Take 10 mg by mouth 3 (three) times daily as needed (pain).   Yes [provider]  pantoprazole (PROTONIX) 40 MG tablet Take 40 mg by mouth daily.   Yes [provider]  potassium gluconate (HM POTASSIUM) 595 (99 K) MG TABS tablet Take 595 mg by mouth daily.   Yes [provider]  pregabalin (LYRICA) 150 MG capsule Take 150 mg by mouth daily.   Yes [provider]  tiZANidine (ZANAFLEX) 4 MG capsule Take 4 mg by mouth 3 (three) times daily.   Yes [provider]  zolpidem (AMBIEN) 10 MG tablet Take 10 mg by mouth at bedtime as needed for sleep.   Yes [provider]  busPIRone (BUSPAR) 15 MG tablet Take 15 mg by mouth 2 (two) times daily.    [provider]     Vital Signs: BP (!) 130/117 (BP Location: Right Arm)   Pulse (!) 40   Temp 99.6 F (37.6 C) (Axillary)   Resp 18   Ht 5\' 9"  (1.753 m)   Wt 130 lb 8.2 oz (59.2 kg)   SpO2 97%   BMI 19.27 kg/m   Physical Exam  NAD, alert, confused Abd: soft, right PCN in place, very little blood-tinged output in bag  Imaging: Dg Chest Port 1 View  Result Date: 03/29/2017 CLINICAL DATA:  Pt has not had a BM since 9/21, c/o low back pain, recent nephrostomy tube placement, and recent  retroperitoneal bleed. Pt has been groaning in pain for hours. EXAM: PORTABLE CHEST 1 VIEW COMPARISON:  03/26/2017 FINDINGS: Right IJV catheter is in place, crimped in the region of the neck. The heart is enlarged. There are hazy airspace filling opacities bilaterally, compatible with edema or infectious process. No pleural effusions. IMPRESSION: Cardiomegaly.  Suspect increased edema or infection. Electronically Signed   By: Nolon Nations M.D.   On: 03/29/2017 07:20   Dg Abd Portable 1v  Result Date: 03/29/2017 CLINICAL DATA:  Pt has not had a BM since 9/21, c/o low back pain, recent nephrostomy tube placement, and recent retroperitoneal bleed. Pt has been  groaning in pain for hours. EXAM: PORTABLE ABDOMEN - 1 VIEW COMPARISON:  03/17/2017 FINDINGS: Right-sided percutaneous nephrostomy has been placed since the prior study. There is contrast within nondilated loops of colon. Average stool burden. No evidence for free intraperitoneal air or bowel obstruction. Scattered phleboliths are identified within the pelvis. IMPRESSION: Nonobstructive bowel gas pattern. Retained contrast, presumably follow up CT of the abdomen and pelvis on 03/26/2017. Electronically Signed   By: Nolon Nations M.D.   On: 03/29/2017 07:18   Ir Nephrostomy Placement Right  Result Date: 03/27/2017 INDICATION: Right-sided hydronephrosis secondary to ureteral obstruction. EXAM: IR NEPHROSTOMY PLACEMENT RIGHT COMPARISON:  CT of the abdomen on 03/26/2017 MEDICATIONS: Ciprofloxacin 400 mg IV; The antibiotic was administered in an appropriate time frame prior to skin puncture. ANESTHESIA/SEDATION: Fentanyl 100 mcg IV; Versed 4.0 mg IV Moderate Sedation Time:  10 minutes. The patient was continuously monitored during the procedure by the interventional radiology nurse under my direct supervision. CONTRAST:  10 mL Isovue-300 - administered into the collecting system(s) FLUOROSCOPY TIME:  Fluoroscopy Time: 1 minutes and 30 seconds (4.1 mGy). COMPLICATIONS: None immediate. PROCEDURE: Informed written consent was obtained from the patient after a thorough discussion of the procedural risks, benefits and alternatives. All questions were addressed. Maximal Sterile Barrier Technique was utilized including caps, mask, sterile gowns, sterile gloves, sterile drape, hand hygiene and skin antiseptic. A timeout was performed prior to the initiation of the procedure. Ultrasound was used to localize the right kidney. Under direct ultrasound guidance, a 21 gauge needle was advanced into the collecting system. After aspiration of urine, contrast was injected. A guidewire was advanced into the collecting system. A  transitional dilator was placed over the wire. The percutaneous tract was dilated over the wire and a 10 French percutaneous nephrostomy tube advanced. This was formed in the collecting system and a fluoroscopic spot image obtained after injection of contrast material. The catheter was flushed and connected to a gravity drainage bag. It was secured at the skin with a Prolene retention suture and StatLock device. FINDINGS: By ultrasound and also later with contrast injection, hydronephrosis of the right kidney does appear to be improved. However, given renal failure progressive hyperkalemia, there was felt to be indication to proceed with nephrostomy tube placement. The tube was placed via interpolar access with the catheter formed in the renal pelvis. IMPRESSION: Right-sided percutaneous nephrostomy tube placement with placement of a 10 French catheter formed at the level of the renal pelvis. The catheter was attached to gravity bag drainage. Electronically Signed   By: Aletta Edouard M.D.   On: 03/27/2017 16:21    Labs:  CBC:  Recent Labs  03/29/17 1156 03/29/17 1910 03/30/17 0616 03/31/17 0416  WBC 15.4* 13.4* 10.2 10.8*  HGB 6.8* 7.8* 9.5* 8.9*  HCT 20.1* 23.2* 28.2* 25.8*  PLT 145* 149* 148* 194  COAGS:  Recent Labs  03/17/17 0439 03/24/17 0753 03/26/17 1132 03/26/17 2147  INR 1.53 1.16 1.33 1.34  APTT  --   --  63*  --     BMP:  Recent Labs  03/28/17 2330 03/29/17 1156 03/30/17 0603 03/31/17 0416  NA 127* 132* 138 137  K 4.1 3.6 3.7 2.7*  CL 94* 98* 97* 99*  CO2 21* 22 24 24   GLUCOSE 147* 186* 115* 119*  BUN 51* 52* 49* 43*  CALCIUM 8.2* 8.2* 8.9 8.5*  CREATININE 5.00* 4.64* 4.36* 4.11*  GFRNONAA 8* 9* 10* 10*  GFRAA 10* 10* 11* 12*    LIVER FUNCTION TESTS:  Recent Labs  03/21/17 0444 03/25/17 0433 03/26/17 0953  03/28/17 2330 03/29/17 1156 03/30/17 0603 03/31/17 0416  BILITOT 1.3* 1.1 1.0  --  1.4*  --   --   --   AST 162* 46* 39  --  48*  --    --   --   ALT 677* 193* 121*  --  90*  --   --   --   ALKPHOS 85 120 109  --  129*  --   --   --   PROT 6.0* 5.6* 5.8*  --  5.7*  --   --   --   ALBUMIN 3.2* 3.1* 2.7*  < > 2.7* 2.5* 2.6* 2.6*  < > = values in this interval not displayed.  Assessment and Plan: Right hydronephrosis related to hematoma and pyelonephritis Patient confused today.  Cousin at bedside states patient may not have teeth pulled during admission.  Tmax 100.4 this AM with WBC overall stable/slight increase from 10.2  10.8 PCN with very little blood-tinged output.  SCr now 4.11 and UOP improving.  Will discuss case with MD.  Electronically Signed: Docia Barrier, PA 03/31/2017, 12:30 PM   I spent a total of 15 Minutes at the the patient's bedside AND on the patient's hospital floor or unit, greater than 50% of which was counseling/coordinating care for hydronephrosis

## 2017-04-01 DIAGNOSIS — B9620 Unspecified Escherichia coli [E. coli] as the cause of diseases classified elsewhere: Secondary | ICD-10-CM

## 2017-04-01 DIAGNOSIS — R7881 Bacteremia: Secondary | ICD-10-CM

## 2017-04-01 DIAGNOSIS — N132 Hydronephrosis with renal and ureteral calculous obstruction: Secondary | ICD-10-CM

## 2017-04-01 DIAGNOSIS — N12 Tubulo-interstitial nephritis, not specified as acute or chronic: Secondary | ICD-10-CM

## 2017-04-01 DIAGNOSIS — A4101 Sepsis due to Methicillin susceptible Staphylococcus aureus: Secondary | ICD-10-CM

## 2017-04-01 LAB — RENAL FUNCTION PANEL
Albumin: 2.9 g/dL — ABNORMAL LOW (ref 3.5–5.0)
Anion gap: 14 (ref 5–15)
BUN: 35 mg/dL — ABNORMAL HIGH (ref 6–20)
CO2: 24 mmol/L (ref 22–32)
Calcium: 9 mg/dL (ref 8.9–10.3)
Chloride: 99 mmol/L — ABNORMAL LOW (ref 101–111)
Creatinine, Ser: 3.62 mg/dL — ABNORMAL HIGH (ref 0.44–1.00)
GFR calc Af Amer: 14 mL/min — ABNORMAL LOW (ref >60)
GFR calc non Af Amer: 12 mL/min — ABNORMAL LOW (ref >60)
Glucose, Bld: 120 mg/dL — ABNORMAL HIGH (ref 65–99)
Phosphorus: 3.5 mg/dL (ref 2.5–4.6)
Potassium: 3.2 mmol/L — ABNORMAL LOW (ref 3.5–5.1)
Sodium: 137 mmol/L (ref 135–145)

## 2017-04-01 LAB — CBC
HCT: 30.8 % — ABNORMAL LOW (ref 36.0–46.0)
Hemoglobin: 10.4 g/dL — ABNORMAL LOW (ref 12.0–15.0)
MCH: 26.9 pg (ref 26.0–34.0)
MCHC: 33.8 g/dL (ref 30.0–36.0)
MCV: 79.6 fL (ref 78.0–100.0)
Platelets: 246 10*3/uL (ref 150–400)
RBC: 3.87 MIL/uL (ref 3.87–5.11)
RDW: 17.3 % — ABNORMAL HIGH (ref 11.5–15.5)
WBC: 10.6 10*3/uL — ABNORMAL HIGH (ref 4.0–10.5)

## 2017-04-01 LAB — GLUCOSE, CAPILLARY
Glucose-Capillary: 180 mg/dL — ABNORMAL HIGH (ref 65–99)
Glucose-Capillary: 148 mg/dL — ABNORMAL HIGH (ref 65–99)
Glucose-Capillary: 199 mg/dL — ABNORMAL HIGH (ref 65–99)
Glucose-Capillary: 147 mg/dL — ABNORMAL HIGH (ref 65–99)

## 2017-04-01 LAB — POTASSIUM: Potassium: 4.1 mmol/L (ref 3.5–5.1)

## 2017-04-01 LAB — MAGNESIUM: Magnesium: 1.6 mg/dL — ABNORMAL LOW (ref 1.7–2.4)

## 2017-04-01 MED ORDER — MAGNESIUM SULFATE 2 GM/50ML IV SOLN
2.0000 g | Freq: Once | INTRAVENOUS | Status: AC
  Administered 2017-04-01: 2 g via INTRAVENOUS
  Filled 2017-04-01: qty 50

## 2017-04-01 MED ORDER — POTASSIUM CHLORIDE CRYS ER 20 MEQ PO TBCR
60.0000 meq | EXTENDED_RELEASE_TABLET | Freq: Once | ORAL | Status: AC
  Administered 2017-04-01: 60 meq via ORAL
  Filled 2017-04-01: qty 3

## 2017-04-01 MED ORDER — FERROUS SULFATE 325 (65 FE) MG PO TABS
325.0000 mg | ORAL_TABLET | Freq: Two times a day (BID) | ORAL | Status: DC
  Administered 2017-04-02 – 2017-04-04 (×5): 325 mg via ORAL
  Filled 2017-04-01 (×5): qty 1

## 2017-04-01 MED ORDER — AMIODARONE HCL 200 MG PO TABS
200.0000 mg | ORAL_TABLET | Freq: Two times a day (BID) | ORAL | Status: DC
  Administered 2017-04-01 – 2017-04-02 (×3): 200 mg via ORAL
  Filled 2017-04-01 (×3): qty 1

## 2017-04-01 NOTE — Progress Notes (Signed)
Pt in and out of vent bigeminy very frequently this AM. gave scheduled amio, can give Lopressor at 11. Will continue to monitor closely. Relayed all to attending MD

## 2017-04-01 NOTE — Progress Notes (Signed)
  Speech Language Pathology Treatment: Dysphagia  Patient Details Name: Jocelyn Hill. Gervase MRN: [id number] DOB: 07-Aug-1950 Today's Date: 04/01/2017 Time: 3846-6599 SLP Time Calculation (min) (ACUTE ONLY): 11 min  Assessment / Plan / Recommendation Clinical Impression  SLP provided skilled observation during lunch meal. Min cues were provided for swallowing strategies - primarily to alternate between solids/liquids. Intermittent throat clearing is observed but she has no overt coughing or changes in vocal quality. Continue to suspect that this is largely related to h/o esophageal issues, as she has appeared to tolerate this diet over time now. Pt has c/o dental pain even when consuming soft textures or liquids. She does not want to advance her solids. Recommend to continue with Dys 2 textures and thin liquids. SLP to sign off as she appears to be at baseline swallowing function.   HPI HPI: Darice Vicario. Broweris a 66 y.o.femalewith medical history significant for poorly controlled hypertension, GERD, stage III chronic kidney disease, bronchitis, diabetes mellitus, peripheral neuropathy and hypothyroidism admitted with shortness of breath. Found to have a BNP of 1700 with a new diagnosis of CHF. CXR continued bilateral effusions, right greater the left with bibasilar atelectasis or infiltrates.  Per MD note her chest pressure/ache is described as located midsternally with some radiation in all direction and  feeling like something is stuck in her throat. Pt coded 03/29/2023 and intubated and extubated 9/19. BSE 03/13/17 with suspected esophageal dysphagia (sope with GI 10 yrs ago), recommended to follow up with GI as outpatient, regular and thin recommended. Recent dental extraction (upper).      SLP Plan  Continue with current plan of care       Recommendations  Diet recommendations: Dysphagia 2 (fine chop);Thin liquid Liquids provided via: Straw;Cup Medication Administration: Whole meds with  liquid Supervision: Intermittent supervision to cue for compensatory strategies;Patient able to self feed;Staff to assist with self feeding Compensations: Minimize environmental distractions;Small sips/bites;Slow rate Postural Changes and/or Swallow Maneuvers: Seated upright 90 degrees;Upright 30-60 min after meal                Oral Care Recommendations: Oral care QID Follow up Recommendations: None SLP Visit Diagnosis: Dysphagia, unspecified (R13.10) Plan: Continue with current plan of care       GO                Germain Osgood 04/01/2017, 2:44 PM  Germain Osgood, M.A. CCC-SLP [phone]

## 2017-04-01 NOTE — Progress Notes (Signed)
Referring Physician(s):  Dr. Moshe Cipro  Supervising Physician: Aletta Edouard  Patient Status:  [facility] - In-pt  Chief Complaint: Right hydronephrosis s/p PCN placement 9/28  Subjective: Patient remains confused.  No family with her today.  Asking for water.   Allergies: Ace inhibitors  Medications: Prior to Admission medications   Medication Sig Start Date End Date Taking? Authorizing Provider  albuterol (PROVENTIL HFA;VENTOLIN HFA) 108 (90 Base) MCG/ACT inhaler Inhale 2 puffs into the lungs every 4 (four) hours as needed for wheezing or shortness of breath.   Yes [provider]  amLODipine (NORVASC) 10 MG tablet Take 10 mg by mouth daily.   Yes [provider]  aspirin EC 81 MG tablet Take 81 mg by mouth daily.   Yes [provider]  atorvastatin (LIPITOR) 40 MG tablet Take 40 mg by mouth daily.   Yes [provider]  b complex vitamins capsule Take 1 capsule by mouth daily.   Yes [provider]  buPROPion (WELLBUTRIN XL) 300 MG 24 hr tablet Take 300 mg by mouth daily.   Yes [provider]  cholecalciferol (VITAMIN D) 1000 units tablet Take 1,000 Units by mouth daily.   Yes [provider]  DULoxetine (CYMBALTA) 30 MG capsule Take 30 mg by mouth daily.   Yes [provider]  gabapentin (NEURONTIN) 300 MG capsule Take 300 mg by mouth at bedtime.   Yes [provider]  hydrALAZINE (APRESOLINE) 100 MG tablet Take 100 mg by mouth 3 (three) times daily.   Yes [provider]  labetalol (NORMODYNE) 200 MG tablet Take 200 mg by mouth 2 (two) times daily.   Yes [provider]  levothyroxine (SYNTHROID, LEVOTHROID) 112 MCG tablet Take 112 mcg by mouth daily before breakfast.   Yes [provider]  minoxidil (LONITEN) 2.5 MG tablet Take by mouth daily.   Yes [provider]  montelukast (SINGULAIR) 10 MG tablet Take 10 mg by mouth daily.   Yes [provider]    Oxycodone HCl 10 MG TABS Take 10 mg by mouth 3 (three) times daily as needed (pain).   Yes [provider]  pantoprazole (PROTONIX) 40 MG tablet Take 40 mg by mouth daily.   Yes [provider]  potassium gluconate (HM POTASSIUM) 595 (99 K) MG TABS tablet Take 595 mg by mouth daily.   Yes [provider]  pregabalin (LYRICA) 150 MG capsule Take 150 mg by mouth daily.   Yes [provider]  tiZANidine (ZANAFLEX) 4 MG capsule Take 4 mg by mouth 3 (three) times daily.   Yes [provider]  zolpidem (AMBIEN) 10 MG tablet Take 10 mg by mouth at bedtime as needed for sleep.   Yes [provider]  busPIRone (BUSPAR) 15 MG tablet Take 15 mg by mouth 2 (two) times daily.    [provider]     Vital Signs: BP (!) 157/77 (BP Location: Left Arm)   Pulse 76   Temp 98.4 F (36.9 C) (Oral)   Resp 16   Ht 5\' 9"  (1.753 m)   Wt 125 lb (56.7 kg)   SpO2 95%   BMI 18.46 kg/m   Physical Exam  NAD, alert, confused, less restless Abd: soft, right PCN in place, very little blood-tinged output in bag again today  Imaging: Dg Chest Port 1 View  Result Date: 03/29/2017 CLINICAL DATA:  Pt has not had a BM since 9/21, c/o low back pain, recent nephrostomy tube placement,  and recent retroperitoneal bleed. Pt has been groaning in pain for hours. EXAM: PORTABLE CHEST 1 VIEW COMPARISON:  03/26/2017 FINDINGS: Right IJV catheter is in place, crimped in the region of the neck. The heart is enlarged. There are hazy airspace filling opacities bilaterally, compatible with edema or infectious process. No pleural effusions. IMPRESSION: Cardiomegaly.  Suspect increased edema or infection. Electronically Signed   By: Nolon Nations M.D.   On: 03/29/2017 07:20   Dg Abd Portable 1v  Result Date: 03/29/2017 CLINICAL DATA:  Pt has not had a BM since 9/21, c/o low back pain, recent nephrostomy tube placement, and recent retroperitoneal bleed. Pt has been groaning  in pain for hours. EXAM: PORTABLE ABDOMEN - 1 VIEW COMPARISON:  03/17/2017 FINDINGS: Right-sided percutaneous nephrostomy has been placed since the prior study. There is contrast within nondilated loops of colon. Average stool burden. No evidence for free intraperitoneal air or bowel obstruction. Scattered phleboliths are identified within the pelvis. IMPRESSION: Nonobstructive bowel gas pattern. Retained contrast, presumably follow up CT of the abdomen and pelvis on 03/26/2017. Electronically Signed   By: Nolon Nations M.D.   On: 03/29/2017 07:18    Labs:  CBC:  Recent Labs  03/29/17 1910 03/30/17 0616 03/31/17 0416 04/01/17 0359  WBC 13.4* 10.2 10.8* 10.6*  HGB 7.8* 9.5* 8.9* 10.4*  HCT 23.2* 28.2* 25.8* 30.8*  PLT 149* 148* 194 246    COAGS:  Recent Labs  03/17/17 0439 03/24/17 0753 03/26/17 1132 03/26/17 2147  INR 1.53 1.16 1.33 1.34  APTT  --   --  63*  --     BMP:  Recent Labs  03/29/17 1156 03/30/17 0603 03/31/17 0416 04/01/17 0359  NA 132* 138 137 137  K 3.6 3.7 2.7* 3.2*  CL 98* 97* 99* 99*  CO2 22 24 24 24   GLUCOSE 186* 115* 119* 120*  BUN 52* 49* 43* 35*  CALCIUM 8.2* 8.9 8.5* 9.0  CREATININE 4.64* 4.36* 4.11* 3.62*  GFRNONAA 9* 10* 10* 12*  GFRAA 10* 11* 12* 14*    LIVER FUNCTION TESTS:  Recent Labs  03/21/17 0444 03/25/17 0433 03/26/17 0953  03/28/17 2330 03/29/17 1156 03/30/17 0603 03/31/17 0416 04/01/17 0359  BILITOT 1.3* 1.1 1.0  --  1.4*  --   --   --   --   AST 162* 46* 39  --  48*  --   --   --   --   ALT 677* 193* 121*  --  90*  --   --   --   --   ALKPHOS 85 120 109  --  129*  --   --   --   --   PROT 6.0* 5.6* 5.8*  --  5.7*  --   --   --   --   ALBUMIN 3.2* 3.1* 2.7*  < > 2.7* 2.5* 2.6* 2.6* 2.9*  < > = values in this interval not displayed.  Assessment and Plan: Right hydronephrosis related to hematoma and pyelonephritis Patient confused but less restless.   WBC stable from yesterday.  PCN with very little  blood-tinged output.  SCr now 3.62 and UOP improving. Ok to d/c tube per Nephrology. Discussed case with Dr. Kathlene Cote who recommend nephrostogram this week and possible drain removal.  Will place orders.   Electronically Signed: Docia Barrier, PA 04/01/2017, 3:06 PM   I spent a total of 15 Minutes at the the patient's bedside AND on the patient's hospital floor or unit, greater than  50% of which was counseling/coordinating care for hydronephrosis

## 2017-04-01 NOTE — Plan of Care (Signed)
Problem: SLP Dysphagia Goals Goal: Misc Dysphagia Goal Outcome: Not Applicable Date Met: 97/35/32 Pt unable to tolerate regular diet given reports of dental pain - she does not want to be advanced.

## 2017-04-01 NOTE — Progress Notes (Signed)
PROGRESS NOTE    Jocelyn Hill  XTG:[id number] DOB: [birth year] DOA: 03/12/2017 PCP: [name], MD   Brief Narrative:  66 y.o. BF PMHx Anxiety, Depression, CVA, poorly controlled HTN, CKD stage III, DM type II with Peripheral Neuropathy, Hypothyroidism    Who states that over the past month, she is noted she has become more winded. Then starting on Saturday, 9/8, patient cut the point where she felt like she is having a lot of trouble breathing. The symptoms continued to progress to the point where she could not take it anymore and she came in to the emergency room at [facility] for further evaluation. Patient found to have a BNP of 1700 with a new diagnosis of CHF. Chest x-ray noted moderate bilateral pleural effusions. Patient was given Lasix and put on nitroglycerin patch. Blood pressure systolic in the 270J-500X. Patient initially BiPAP to keep oxygen saturations greater than 90%. In addition, creatinine noted to be 3.2 or baseline was 2.1. Because of worsening creatinine and decompensated CHF, it was felt best to transfer this patient to [facility]. Hospitalists accepted transfer.    Subjective: 10/3 A/O 4, negative abdominal pain, positive SOB, positive CP (SOB/CP unchanged from last week). States she is radiated to have her teeth removed in or to proceed forward with cardiac catheterization and mitral valve replacement.        Assessment & Plan:   Principal Problem:   Acute on chronic respiratory failure with hypoxia (HCC) Active Problems:   Hypertension   Hypothyroidism   Diet-controlled diabetes mellitus (HCC)   Acute diastolic (congestive) heart failure (HCC)   AKI (acute kidney injury) (HCC)   CKD (chronic kidney disease), stage III (HCC)   CHF (congestive heart failure) (HCC)   Dysphagia   Malignant hypertension   Microcytic anemia   Severe mitral regurgitation   CKD (chronic kidney disease), stage IV (HCC)   Substance abuse (HCC)   Acute diastolic  CHF (congestive heart failure) (HCC)   Pulmonary hypertension (HCC)   Asystole (HCC)   Non-rheumatic mitral regurgitation   Acute renal failure superimposed on stage 3 chronic kidney disease ([place])   Anemia due to chronic kidney disease   Controlled diabetes mellitus type 2 with complications (HCC)   Acute encephalopathy   Sepsis/ positive Klebsiella pneumoniae/Escherichia coli pyelonephritis/  positive Escherichia coli Bacteremia -Patient with pyelonephritis and bacteremia will require minimal 2 weeks antibiotics. -10/3 redraw urine and blood cultures to ensure clearance  Acute Respiratory failure with Hypoxia/Pulmonary Vascular congestion -Resolved, on room air    Substance abuse -UDS positive for benzodiazepine, opiates, marijuana. Unable to differentiate if positive prior to hospitalization for Benzodiazepine/Opiates.   Acute Diastolic CHF -Strict in and out since admission since admission -28.4 L -Monitor CVP. Patient fluid overloaded. 10/3 CVP = ?? -Daily weight Filed Weights   03/30/17 0415 03/31/17 0434 04/01/17 0407  Weight: 132 lb 15 oz (60.3 kg) 130 lb 8.2 oz (59.2 kg) 125 lb (56.7 kg)  -Amiodarone 200 mg BID -Hydralazine 75 mg  TID -Hydralazine  PRN -Metoprolol IV 5 mg QID -Monitor closely for bradycardia. Notable blocking agents have been restarted.  -Cardiac catheterization pending: Will reconsult in the A.m. awaiting clearance of bacteremia?    Pulmonary HTN  -See CHF   Bradycardia/Asystole -See CHF  Severe Mitral Valve regurgitation -Per cardiology, requires Dental consult prior to MV replacement. Unfortunately adult dental services -evaluated by Jorja Loa DDS. Concurs multiple teeth need to be removed. Per his note appears patient was on the  fence about having her teeth removed, however today stated she will do anything needed to advance her care. Will reconsult Dr. Enrique Sack in the a.m.  Paroxysmal atrial fibrillation/flutter -See CHF -DC heparin drip  secondary to retroperitoneal bleed   Acute on CKD stage III    Recent Labs Lab 03/28/17 0548 03/28/17 2330 03/29/17 1156 03/30/17 0603 03/31/17 0416 04/01/17 0359  CREATININE 4.92* 5.00* 4.64* 4.36* 4.11* 3.62*  -creatinine improving  Right Hydronephrosis -Repeat CT scan shows RIGHT hydronephrosis secondary to RIGHT ureteral obstruction secondary to RIGHT Retroperitoneal bleed  -Discussed case with Dr. Lafayette Dragon [facility]: RIGHT nephrostomy tube placed per recommendation,   Chronic anemia(Mixed anemia)/Acute Blood Loss anemia -No overt sign of bleeding -Anemia panel: Although MCV 76, panel not consistent pure microcytic anemia.   Recent Labs Lab 03/28/17 0548 03/29/17 1156 03/29/17 1910 03/30/17 0616 03/31/17 0416 04/01/17 0359  HGB 7.4* 6.8* 7.8* 9.5* 8.9* 10.4*  -Continue Fe 325 mg BID + vitamin C 250 mg BID -Bleeding appears to have stabilized.   Diabetes type 2 controlled with, complication -7/86 Hemoglobin A1c = 6.2 -Lantus 4 units daily   -Moderate SSI  Hypothyroidism -Synthroid 112 g daily   Acute encephalopathy  -Resolved  Hypokalemia -Potassium goal> 4 - K-Dur 60 mg -Repeat K/Mg @ 1500: Within goal  Hypomagnesemia -Magnesium goal> 2 -Magnesium IV 2 g  Right retroperitoneal hematoma/Abdominal pain -acute RUQ abdominal pain with Alvarado score>4 -9/25 Abdominal CT; showed right retroperitoneal bleed see results below     DVT prophylaxis: SCD Code Status: Full Family Communication: None Disposition Plan: TBD   Consultants:  Cardiology Nephrology [facility]     Procedures/Significant Events:  9/13 Admit 9/13 Echocardiogram: LVEF= 60-65%. -Grade 2 diastolic dysfunction.-Severe MR.-Mildly dilated LA and RA.-, mild to mod TR,. PAP 60 mm Hg. 9/18 TEE: EF 55-60%.-Severe restriction anterior/posterior leaflet MV w/ severe regurgitation  9/17 CXR: Bilateral pleural effusion.-Mild pulmonary interstitial  edema.-Cardiomegaly  9/17 Brady/hypotensive/ intubated > transfer to ICU 9/20 patient to have pauses longest 6.7 seconds/bradycardia. All nodal blocking agents and NTG drips discontinued. 3 Amps Atropine administered.  9/25 Abdominal/pelvis CT:-RIGHT retroperitoneal hematoma extending into the right side of pelvis and right groin region. -Hematocrit level isidentified within the asymmetrically enlarged right psoas muscle hematoma. - Appendix is normal. -Gallbladder hydrops. Etiology and clinical significance indeterminate.  -Bilateral pleural effusions with diminished aeration to the lung bases. 9/25 RIGHT Thoracentesis; 0.35 L of pleural fluid 9/27 PCXR: Continue left pleural effusion/LLL atelectasis improved 9/27 CT abdomen pelvis WO contrast:-Stable RIGHT retroperitoneal hematoma.-Moderate RIGHT hydronephrosis most likely secondary to obstruction distal ureter secondary to hematoma -Dilated gallbladder without definite surrounding inflammation 9/28 RIGHT percutaneous nephrostomy tube 10/4 urine pending 10/4 blood pending     I have personally reviewed and interpreted all radiology studies and my findings are as above.  VENTILATOR SETTINGS: None   Cultures 9/25 RIGHT pleural fluid NGTD 9/27 urine positive Klebsiella pneumoniae/Escherichia coli 9/27 blood positive Escherichia coli      Antimicrobials: Anti-infectives    Start     Stop   03/16/17 1000  azithromycin (ZITHROMAX) tablet 250 mg  Status:  Discontinued     03/16/17 1316   03/14/17 0900  azithromycin (ZITHROMAX) 500 mg in dextrose 5 % 250 mL IVPB  Status:  Discontinued     03/16/17 0804        Devices     LINES / TUBES:      Continuous Infusions: . cefTRIAXone (ROCEPHIN)  IV Stopped (03/31/17 1927)  Objective: Vitals:   04/01/17 0400 04/01/17 0407 04/01/17 0532 04/01/17 0741  BP: (!) 161/76  (!) 144/77   Pulse:    (!) 56  Resp: 14   15  Temp: 98.4 F (36.9 C)   97.7 F (36.5 C)    TempSrc: Oral   Oral  SpO2:    (!) 87%  Weight:  125 lb (56.7 kg)    Height:        Intake/Output Summary (Last 24 hours) at 04/01/17 2979 Last data filed at 04/01/17 0748  Gross per 24 hour  Intake               80 ml  Output             4291 ml  Net            -4211 ml   Filed Weights   03/30/17 0415 03/31/17 0434 04/01/17 0407  Weight: 132 lb 15 oz (60.3 kg) 130 lb 8.2 oz (59.2 kg) 125 lb (56.7 kg)    Physical Exam:  General: A/O 4, negative acute respiratory distress Neck:  Negative scars, masses, torticollis, lymphadenopathy, JVD Lungs: Clear to auscultation bilaterally without wheezes or crackles Cardiovascular: Regular rate and rhythm without murmur gallop or rub normal S1 and S2 Abdomen: negative abdominal pain, nondistended, positive soft, bowel sounds, no rebound, no ascites, no appreciable mass Extremities: No significant cyanosis, clubbing, or edema bilateral lower extremities Skin: Negative rashes, lesions, ulcers Psychiatric:  Negative depression, positive anxiety, negative fatigue, negative mania  Central nervous system:  Cranial nerves II through XII intact, tongue/uvula midline, all extremities muscle strength 5/5, sensation intact throughout, negative dysarthria, negative expressive aphasia, negative receptive aphasia.     Data Reviewed: Care during the described time interval was provided by me .  I have reviewed this patient's available data, including medical history, events of note, physical examination, and all test results as part of my evaluation.   CBC:  Recent Labs Lab 03/29/17 1156 03/29/17 1910 03/30/17 0616 03/31/17 0416 04/01/17 0359  WBC 15.4* 13.4* 10.2 10.8* 10.6*  HGB 6.8* 7.8* 9.5* 8.9* 10.4*  HCT 20.1* 23.2* 28.2* 25.8* 30.8*  MCV 76.7* 78.1 78.3 78.7 79.6  PLT 145* 149* 148* 194 892   Basic Metabolic Panel:  Recent Labs Lab 03/26/17 0953 03/26/17 1500 03/26/17 2147  03/28/17 0548 03/28/17 2330 03/29/17 1156  03/30/17 0603 03/31/17 0416 04/01/17 0359  NA 131* 130*  --   < > 134* 127* 132* 138 137 137  K 5.9* 5.9* 5.7*  < > 5.1 4.1 3.6 3.7 2.7* 3.2*  CL 93* 94*  --   < > 99* 94* 98* 97* 99* 99*  CO2 25 26  --   < > 21* 21* 22 24 24 24   GLUCOSE 144* 165*  --   < > 145* 147* 186* 115* 119* 120*  BUN 33* 36*  --   < > 53* 51* 52* 49* 43* 35*  CREATININE 3.62* 3.61*  --   < > 4.92* 5.00* 4.64* 4.36* 4.11* 3.62*  CALCIUM 9.7 9.3  --   < > 8.9 8.2* 8.2* 8.9 8.5* 9.0  MG 1.8 1.8 1.6*  --   --  1.8  --   --   --   --   PHOS  --   --   --   < > 5.0*  --  4.2 4.1 3.3 3.5  < > = values in this interval not displayed. GFR: Estimated Creatinine Clearance: 13.9  mL/min (A) (by C-G formula based on SCr of 3.62 mg/dL (H)). Liver Function Tests:  Recent Labs Lab 03/26/17 0953  03/28/17 2330 03/29/17 1156 03/30/17 0603 03/31/17 0416 04/01/17 0359  AST 39  --  48*  --   --   --   --   ALT 121*  --  90*  --   --   --   --   ALKPHOS 109  --  129*  --   --   --   --   BILITOT 1.0  --  1.4*  --   --   --   --   PROT 5.8*  --  5.7*  --   --   --   --   ALBUMIN 2.7*  < > 2.7* 2.5* 2.6* 2.6* 2.9*  < > = values in this interval not displayed. No results for input(s): LIPASE, AMYLASE in the last 168 hours. No results for input(s): AMMONIA in the last 168 hours. Coagulation Profile:  Recent Labs Lab 03/26/17 1132 03/26/17 2147  INR 1.33 1.34   Cardiac Enzymes: No results for input(s): CKTOTAL, CKMB, CKMBINDEX, TROPONINI in the last 168 hours. BNP (last 3 results) No results for input(s): PROBNP in the last 8760 hours. HbA1C: No results for input(s): HGBA1C in the last 72 hours. CBG:  Recent Labs Lab 03/31/17 0800 03/31/17 1153 03/31/17 1548 03/31/17 2142 04/01/17 0744  GLUCAP 115* 167* 120* 144* 180*   Lipid Profile: No results for input(s): CHOL, HDL, LDLCALC, TRIG, CHOLHDL, LDLDIRECT in the last 72 hours. Thyroid Function Tests: No results for input(s): TSH, T4TOTAL, FREET4, T3FREE,  THYROIDAB in the last 72 hours. Anemia Panel: No results for input(s): VITAMINB12, FOLATE, FERRITIN, TIBC, IRON, RETICCTPCT in the last 72 hours. Urine analysis:    Component Value Date/Time   COLORURINE YELLOW 03/26/2017 1539   APPEARANCEUR CLOUDY (A) 03/26/2017 1539   LABSPEC 1.010 03/26/2017 1539   PHURINE 6.5 03/26/2017 1539   GLUCOSEU NEGATIVE 03/26/2017 1539   HGBUR LARGE (A) 03/26/2017 1539   BILIRUBINUR NEGATIVE 03/26/2017 1539   KETONESUR NEGATIVE 03/26/2017 1539   PROTEINUR >300 (A) 03/26/2017 1539   NITRITE NEGATIVE 03/26/2017 1539   LEUKOCYTESUR LARGE (A) 03/26/2017 1539   Sepsis Labs: @LABRCNTIP (procalcitonin:4,lacticidven:4)  ) Recent Results (from the past 240 hour(s))  Gram stain     Status: None   Collection Time: 03/24/17  4:23 PM  Result Value Ref Range Status   Specimen Description FLUID PLEURAL RIGHT  Final   Special Requests NONE  Final   Gram Stain   Final    WBC PRESENT, PREDOMINANTLY MONONUCLEAR NO ORGANISMS SEEN CYTOSPIN SMEAR    Report Status 03/24/2017 FINAL  Final  Fungus Culture With Stain     Status: None (Preliminary result)   Collection Time: 03/24/17  4:23 PM  Result Value Ref Range Status   Fungus Stain Final report  Final    Comment: (NOTE) Performed At: [facility] [place], [place] [id number] Lindon Romp MD [id number]    Fungus (Mycology) Culture PENDING  Incomplete   Fungal Source FLUID  Final    Comment: PLEURAL RIGHT   Acid Fast Smear (AFB)     Status: None   Collection Time: 03/24/17  4:23 PM  Result Value Ref Range Status   AFB Specimen Processing Concentration  Final   Acid Fast Smear Negative  Final    Comment: (NOTE) Performed At: [facility] [place], [place] [id number] Lindon Romp MD  QM:[id number]    Source (AFB) FLUID  Final    Comment: PLEURAL RIGHT   Culture, body fluid-bottle     Status: None   Collection Time: 03/24/17  4:23 PM  Result Value  Ref Range Status   Specimen Description FLUID PLEURAL RIGHT  Final   Special Requests BOTTLES DRAWN AEROBIC AND ANAEROBIC  Final   Culture NO GROWTH 5 DAYS  Final   Report Status 03/29/2017 FINAL  Final  Fungus Culture Result     Status: None   Collection Time: 03/24/17  4:23 PM  Result Value Ref Range Status   Result 1 Comment  Final    Comment: (NOTE) KOH/Calcofluor preparation:  no fungus observed. Performed At: [facility] [place], [place] [id number] Lindon Romp MD [id number]   Urine Culture     Status: Abnormal   Collection Time: 03/26/17  8:07 AM  Result Value Ref Range Status   Specimen Description URINE, CATHETERIZED  Final   Special Requests NONE  Final   Culture (A)  Final    >=100,000 COLONIES/mL KLEBSIELLA PNEUMONIAE >=100,000 COLONIES/mL ESCHERICHIA COLI    Report Status 03/29/2017 FINAL  Final   Organism ID, Bacteria KLEBSIELLA PNEUMONIAE (A)  Final   Organism ID, Bacteria ESCHERICHIA COLI (A)  Final      Susceptibility   Escherichia coli - MIC*    AMPICILLIN 4 SENSITIVE Sensitive     CEFAZOLIN <=4 SENSITIVE Sensitive     CEFTRIAXONE <=1 SENSITIVE Sensitive     CIPROFLOXACIN <=0.25 SENSITIVE Sensitive     GENTAMICIN <=1 SENSITIVE Sensitive     IMIPENEM <=0.25 SENSITIVE Sensitive     NITROFURANTOIN <=16 SENSITIVE Sensitive     TRIMETH/SULFA >=320 RESISTANT Resistant     AMPICILLIN/SULBACTAM <=2 SENSITIVE Sensitive     PIP/TAZO <=4 SENSITIVE Sensitive     Extended ESBL NEGATIVE Sensitive     * >=100,000 COLONIES/mL ESCHERICHIA COLI   Klebsiella pneumoniae - MIC*    AMPICILLIN >=32 RESISTANT Resistant     CEFAZOLIN <=4 SENSITIVE Sensitive     CEFTRIAXONE <=1 SENSITIVE Sensitive     CIPROFLOXACIN <=0.25 SENSITIVE Sensitive     GENTAMICIN <=1 SENSITIVE Sensitive     IMIPENEM <=0.25 SENSITIVE Sensitive     NITROFURANTOIN 64 INTERMEDIATE Intermediate     TRIMETH/SULFA <=20 SENSITIVE Sensitive     AMPICILLIN/SULBACTAM 4 SENSITIVE  Sensitive     PIP/TAZO <=4 SENSITIVE Sensitive     Extended ESBL NEGATIVE Sensitive     * >=100,000 COLONIES/mL KLEBSIELLA PNEUMONIAE  Culture, blood (routine x 2)     Status: None   Collection Time: 03/26/17 10:40 AM  Result Value Ref Range Status   Specimen Description BLOOD LEFT HAND  Final   Special Requests IN PEDIATRIC BOTTLE Blood Culture adequate volume  Final   Culture NO GROWTH 5 DAYS  Final   Report Status 03/31/2017 FINAL  Final  Culture, blood (routine x 2)     Status: Abnormal   Collection Time: 03/26/17 10:40 AM  Result Value Ref Range Status   Specimen Description BLOOD RIGHT HAND  Final   Special Requests IN PEDIATRIC BOTTLE Blood Culture adequate volume  Final   Culture  Setup Time   Final    GRAM NEGATIVE RODS IN PEDIATRIC BOTTLE CRITICAL RESULT CALLED TO, READ BACK BY AND VERIFIED WITH: G.ABBOTT PHARMD 03/27/17 0548 L.CHAMPION    Culture ESCHERICHIA COLI (A)  Final   Report Status 03/29/2017 FINAL  Final   Organism ID, Bacteria ESCHERICHIA COLI  Final      Susceptibility   Escherichia coli - MIC*    AMPICILLIN 4 SENSITIVE Sensitive     CEFAZOLIN <=4 SENSITIVE Sensitive     CEFEPIME <=1 SENSITIVE Sensitive     CEFTAZIDIME <=1 SENSITIVE Sensitive     CEFTRIAXONE <=1 SENSITIVE Sensitive     CIPROFLOXACIN <=0.25 SENSITIVE Sensitive     GENTAMICIN <=1 SENSITIVE Sensitive     IMIPENEM <=0.25 SENSITIVE Sensitive     TRIMETH/SULFA >=320 RESISTANT Resistant     AMPICILLIN/SULBACTAM <=2 SENSITIVE Sensitive     PIP/TAZO <=4 SENSITIVE Sensitive     Extended ESBL NEGATIVE Sensitive     * ESCHERICHIA COLI  Blood Culture ID Panel (Reflexed)     Status: Abnormal   Collection Time: 03/26/17 10:40 AM  Result Value Ref Range Status   Enterococcus species NOT DETECTED NOT DETECTED Final   Listeria monocytogenes NOT DETECTED NOT DETECTED Final   Staphylococcus species NOT DETECTED NOT DETECTED Final   Staphylococcus aureus NOT DETECTED NOT DETECTED Final   Streptococcus  species NOT DETECTED NOT DETECTED Final   Streptococcus agalactiae NOT DETECTED NOT DETECTED Final   Streptococcus pneumoniae NOT DETECTED NOT DETECTED Final   Streptococcus pyogenes NOT DETECTED NOT DETECTED Final   Acinetobacter baumannii NOT DETECTED NOT DETECTED Final   Enterobacteriaceae species DETECTED (A) NOT DETECTED Final    Comment: Enterobacteriaceae represent a large family of gram-negative bacteria, not a single organism. CRITICAL RESULT CALLED TO, READ BACK BY AND VERIFIED WITH: G.ABBOTT PHARMD 03/27/17 0548 L.CHAMPION    Enterobacter cloacae complex NOT DETECTED NOT DETECTED Final   Escherichia coli DETECTED (A) NOT DETECTED Final    Comment: CRITICAL RESULT CALLED TO, READ BACK BY AND VERIFIED WITH: G.ABBOTT PHARMD 03/27/17 0548 L.CHAMPION    Klebsiella oxytoca NOT DETECTED NOT DETECTED Final   Klebsiella pneumoniae NOT DETECTED NOT DETECTED Final   Proteus species NOT DETECTED NOT DETECTED Final   Serratia marcescens NOT DETECTED NOT DETECTED Final   Carbapenem resistance NOT DETECTED NOT DETECTED Final   Haemophilus influenzae NOT DETECTED NOT DETECTED Final   Neisseria meningitidis NOT DETECTED NOT DETECTED Final   Pseudomonas aeruginosa NOT DETECTED NOT DETECTED Final   Candida albicans NOT DETECTED NOT DETECTED Final   Candida glabrata NOT DETECTED NOT DETECTED Final   Candida krusei NOT DETECTED NOT DETECTED Final   Candida parapsilosis NOT DETECTED NOT DETECTED Final   Candida tropicalis NOT DETECTED NOT DETECTED Final  Culture, Urine     Status: Abnormal   Collection Time: 03/26/17  3:38 PM  Result Value Ref Range Status   Specimen Description URINE, RANDOM  Final   Special Requests NONE  Final   Culture MULTIPLE SPECIES PRESENT, SUGGEST RECOLLECTION (A)  Final   Report Status 03/27/2017 FINAL  Final         Radiology Studies: No results found.      Scheduled Meds: . amiodarone  100 mg Oral Daily  . aspirin  81 mg Oral Daily  . hydrALAZINE  75  mg Oral Q8H  . insulin aspart  0-15 Units Subcutaneous TID WC  . insulin glargine  4 Units Subcutaneous Daily  . levothyroxine  112 mcg Oral QAC breakfast  . mouth rinse  15 mL Mouth Rinse BID  . metoprolol tartrate  5 mg Intravenous Q6H  . pantoprazole  40 mg Oral Daily  . polyethylene glycol  17 g Oral BID  . senna-docusate  1 tablet Oral BID  . sodium chloride flush  10-40 mL  Intracatheter Q12H  . vitamin C  250 mg Oral BID   Continuous Infusions: . cefTRIAXone (ROCEPHIN)  IV Stopped (03/31/17 1927)     LOS: 20 days    Time spent: 40 minutes    Birdie Beveridge, Geraldo Docker, MD [facility] Pager [phone]   If 7PM-7AM, please contact night-coverage [email or web address] Password [email or web address] 04/01/2017, 8:08 AM

## 2017-04-01 NOTE — Progress Notes (Signed)
[facility] Progress Note  Subjective:    Still with excellent UOP Off lasix for 24 hours still 4.3 liters recorded out   Vitals:   04/01/17 0407 04/01/17 0532 04/01/17 0741 04/01/17 1116  BP:  (!) 144/77  (!) 157/77  Pulse:   (!) 56 76  Resp:   15 16  Temp:   97.7 F (36.5 C) 98.4 F (36.9 C)  TempSrc:   Oral Oral  SpO2:   (!) 87% 95%  Weight: 56.7 kg (125 lb)     Height:       Physical Exam: VS as noted Looks much more comfortable Regular rhythm S1S2 No S3 3/6 sem Abd soft ntnd Ext no LE edema Poor tissue turgor Clear urine in foley  Inpatient medications:  . amiodarone  200 mg Oral BID  . aspirin  81 mg Oral Daily  . hydrALAZINE  75 mg Oral Q8H  . insulin aspart  0-15 Units Subcutaneous TID WC  . insulin glargine  4 Units Subcutaneous Daily  . levothyroxine  112 mcg Oral QAC breakfast  . mouth rinse  15 mL Mouth Rinse BID  . metoprolol tartrate  5 mg Intravenous Q6H  . pantoprazole  40 mg Oral Daily  . polyethylene glycol  17 g Oral BID  . senna-docusate  1 tablet Oral BID  . sodium chloride flush  10-40 mL Intracatheter Q12H  . vitamin C  250 mg Oral BID   . cefTRIAXone (ROCEPHIN)  IV Stopped (03/31/17 1927)   acetaminophen **OR** acetaminophen, bisacodyl, fentaNYL (SUBLIMAZE) injection, hydrALAZINE, levalbuterol, LORazepam, ondansetron (ZOFRAN) IV, sodium chloride flush   Recent Labs Lab 03/30/17 0603 03/31/17 0416 04/01/17 0359  NA 138 137 137  K 3.7 2.7* 3.2*  CL 97* 99* 99*  CO2 24 24 24   GLUCOSE 115* 119* 120*  BUN 49* 43* 35*  CREATININE 4.36* 4.11* 3.62*  CALCIUM 8.9 8.5* 9.0  PHOS 4.1 3.3 3.5    Recent Labs Lab 03/26/17 0953  03/28/17 2330  03/30/17 0603 03/31/17 0416 04/01/17 0359  AST 39  --  48*  --   --   --   --   ALT 121*  --  90*  --   --   --   --   ALKPHOS 109  --  129*  --   --   --   --   BILITOT 1.0  --  1.4*  --   --   --   --   PROT 5.8*  --  5.7*  --   --   --   --   ALBUMIN 2.7*  < > 2.7*  < >  2.6* 2.6* 2.9*  < > = values in this interval not displayed.  Recent Labs Lab 03/30/17 0616 03/31/17 0416 04/01/17 0359  WBC 10.2 10.8* 10.6*  HGB 9.5* 8.9* 10.4*  HCT 28.2* 25.8* 30.8*  MCV 78.3 78.7 79.6  PLT 148* 194 246   Iron/TIBC/Ferritin/ %Sat    Component Value Date/Time   IRON 62 03/20/2017 0406   TIBC 256 03/20/2017 0406   FERRITIN 148 03/20/2017 0406   IRONPCTSAT 24 03/20/2017 0406    Impression:  66 yo female h/o stroke, COPD,  DM, HTN, HLD, hypothyroidism, anxiety, depression, DJD, CAD, afib, prior MI's. Presented to [facility] 9/13 CHF. BL creatinine 2.1 by report, 3.2 on admission, transferred here b/o renal failure. Acute on chronic dCHF, severe mitral regurgitation. Atrophic L kidney, large RP hematoma (transiently) obstructing R kidney requiring perc tube (no  longer draining).    1. Acute on CKD 4 -  ( baseline- reported around 2.1) - Creatinine peaked at 5, perc tube in since 9/28 for R hydro 2/2 RP hematoma (not draining anything now), creat continues trending down slowly.  Lasix stopped yesterday, still making very good amounts urine. Urine is from foley rather than PCN.  2. R Retroperitoneal hematoma noted on CT 9/25.  Hydro Right (atrophic left) - got perc tube. Good UOP via foley, minimal output from perc tube. ? Remove? Don't see much benefit to leaving in at this point. 3. Pulm edema/acute diastolic HF/volume overload -Weight down 15  kg since 03/22/17 and if anything looks dry. Allow to re-equilibrate off lasix. Allow access to fluids (says is thirsty) 4. Hypokalemia - replete as needed 5. Severe MRegurg - per cards- sounds like cardiac cath and surgical intervention down the line  6. PAF/flutter SR now 7. Volume overload - resolved. Diuretic stopped 10/2 8. Hx CVA/ depression/ chronic pain 9. Anemia- giving iron IV - RPBleed- hgb drop- heparin stopped - cont to follow for transfusion need- hgb up to 10.4 today.  10. Klebs and EColi UTI/E Coli bacteremia (9/27).   On rocephin.  11. DM2 per primary service   Renal function continues to slowly improve. Great urine, no diuretics (looks dry to me but I understand with cardiac issues and prior HF reticence to give any fluid back - would just allow ad lib po). Replete K as needed. I think perc tube should come out as not seeing any urine from there, all is from foley. These are my current summary suggestions.  I am adding little to current management. Please re-consult if any issues I can assist with.   Jamal Maes, MD [facility] [phone] Pager 04/01/2017, 1:03 PM

## 2017-04-01 NOTE — Progress Notes (Signed)
Pts potassium 3.2 this AM. Relayed to attending MD

## 2017-04-01 NOTE — Progress Notes (Signed)
Physical Therapy Treatment Patient Details Name: Jocelyn Hill. [place] MRN: [id number] DOB: [birth year] Today's Date: 04/01/2017    History of Present Illness Pt adm with Acute Respiratory failure with Hypoxia due to pulm edema and B Pleural effusions and Acute Diastolic CHF. Now with new retroperitoneal bleed. s/p right nephrostomy tube 9/29. PMH - ckd, dm, depression, cva    PT Comments    Pt making slow progress. Needs much encouragement. Very tearful.   Follow Up Recommendations  SNF     Equipment Recommendations  Rolling walker with 5" wheels    Recommendations for Other Services       Precautions / Restrictions Precautions Precautions: Fall Restrictions Weight Bearing Restrictions: No    Mobility  Bed Mobility Overal bed mobility: Needs Assistance Bed Mobility: Rolling;Sidelying to Sit;Sit to Supine Rolling: Min assist Sidelying to sit: Min assist;HOB elevated   Sit to supine: Min assist   General bed mobility comments: Assist to elevate trunk into sitting. Assist to bring legs back up into bed  Transfers Overall transfer level: Needs assistance Equipment used:  (Used back of chair) Transfers: Sit to/from Stand Sit to Stand: Mod assist         General transfer comment: Assist to bring hips/trunk up  Ambulation/Gait                 Stairs            Wheelchair Mobility    Modified Rankin (Stroke Patients Only)       Balance Overall balance assessment: Needs assistance Sitting-balance support: Feet supported;Single extremity supported Sitting balance-Leahy Scale: Poor Sitting balance - Comments: UE support.   Standing balance support: Bilateral upper extremity supported Standing balance-Leahy Scale: Poor Standing balance comment: Stood x 2 for 45 sec with min assist with pt holding back of chair. Verbal cues to stand moree erect                            Cognition Arousal/Alertness: Awake/alert Behavior During Therapy:  Flat affect;Anxious Overall Cognitive Status: No family/caregiver present to determine baseline cognitive functioning                                 General Comments: Needs max encouragement to mobilize      Exercises      General Comments        Pertinent Vitals/Pain Pain Assessment: Faces Faces Pain Scale: Hurts whole lot Pain Location: teeth Pain Descriptors / Indicators: Crying Pain Intervention(s): Limited activity within patient's tolerance;Monitored during session    Home Living                      Prior Function            PT Goals (current goals can now be found in the care plan section) Progress towards PT goals: Progressing toward goals (slowly)    Frequency    Min 3X/week      PT Plan Current plan remains appropriate    Co-evaluation              AM-PAC PT "6 Clicks" Daily Activity  Outcome Measure  Difficulty turning over in bed (including adjusting bedclothes, sheets and blankets)?: Unable Difficulty moving from lying on back to sitting on the side of the bed? : Unable Difficulty sitting down on and standing up from a chair with arms (e.g.,  wheelchair, bedside commode, etc,.)?: Unable Help needed moving to and from a bed to chair (including a wheelchair)?: A Lot Help needed walking in hospital room?: Total Help needed climbing 3-5 steps with a railing? : Total 6 Click Score: 7    End of Session Equipment Utilized During Treatment: Gait belt Activity Tolerance: Patient limited by fatigue Patient left: in bed;with call bell/phone within reach;with bed alarm set Nurse Communication: Mobility status PT Visit Diagnosis: Other abnormalities of gait and mobility (R26.89);Muscle weakness (generalized) (M62.81);Unsteadiness on feet (R26.81)     Time: 9409-8286 PT Time Calculation (min) (ACUTE ONLY): 21 min  Charges:  $Therapeutic Activity: 8-22 mins                    G Codes:       [facility]  PT [place] 04/01/2017, 4:05 PM

## 2017-04-01 NOTE — Progress Notes (Signed)
Pt has complaint of 8/10 aching pain in her chest and neck.  Lamar Blinks NP notified. 12 lead EKG obtained and placed in chart. Coverage MD ordered troponin. Collected and sent to lab. Gave 77mcg fentanyl IV. Will continue to monitor.

## 2017-04-01 NOTE — Progress Notes (Signed)
Second visit with patient. She was very upset and calling out loudly  In previous visit. Today she was very quiet. Tears poured from her eyes She did not seem to want to talk or it may have been painful. I asked if she would like to hear some scripture read. She nodded yes and no bible was in her room. I left to get a Bible from [place].  She has other care givers here to work with her to get up. Will read a passage from Bible and finish visit with a prayer.Conard Novak, Chaplain    04/01/17 1500  Clinical Encounter Type  Visited With Patient  Visit Type Follow-up  Referral From Nurse  Spiritual Encounters  Spiritual Needs Prayer

## 2017-04-02 ENCOUNTER — Inpatient Hospital Stay (HOSPITAL_COMMUNITY): Payer: Medicare Other

## 2017-04-02 ENCOUNTER — Encounter (HOSPITAL_COMMUNITY): Payer: Self-pay | Admitting: Interventional Radiology

## 2017-04-02 DIAGNOSIS — N390 Urinary tract infection, site not specified: Secondary | ICD-10-CM

## 2017-04-02 DIAGNOSIS — I498 Other specified cardiac arrhythmias: Secondary | ICD-10-CM

## 2017-04-02 DIAGNOSIS — N1330 Unspecified hydronephrosis: Secondary | ICD-10-CM

## 2017-04-02 HISTORY — PX: IR NEPHROSTOGRAM RIGHT THRU EXISTING ACCESS: IMG6062

## 2017-04-02 HISTORY — PX: IR NEPHRO TUBE REMOV/FL: IMG2342

## 2017-04-02 LAB — MAGNESIUM: Magnesium: 2.1 mg/dL (ref 1.7–2.4)

## 2017-04-02 LAB — RENAL FUNCTION PANEL
Albumin: 2.9 g/dL — ABNORMAL LOW (ref 3.5–5.0)
Anion gap: 12 (ref 5–15)
BUN: 34 mg/dL — ABNORMAL HIGH (ref 6–20)
CO2: 20 mmol/L — ABNORMAL LOW (ref 22–32)
Calcium: 9 mg/dL (ref 8.9–10.3)
Chloride: 105 mmol/L (ref 101–111)
Creatinine, Ser: 3.54 mg/dL — ABNORMAL HIGH (ref 0.44–1.00)
GFR calc Af Amer: 15 mL/min — ABNORMAL LOW (ref >60)
GFR calc non Af Amer: 13 mL/min — ABNORMAL LOW (ref >60)
Glucose, Bld: 139 mg/dL — ABNORMAL HIGH (ref 65–99)
Phosphorus: 3 mg/dL (ref 2.5–4.6)
Potassium: 4 mmol/L (ref 3.5–5.1)
Sodium: 137 mmol/L (ref 135–145)

## 2017-04-02 LAB — CBC
HCT: 29.3 % — ABNORMAL LOW (ref 36.0–46.0)
Hemoglobin: 9.7 g/dL — ABNORMAL LOW (ref 12.0–15.0)
MCH: 26.5 pg (ref 26.0–34.0)
MCHC: 33.1 g/dL (ref 30.0–36.0)
MCV: 80.1 fL (ref 78.0–100.0)
Platelets: 259 10*3/uL (ref 150–400)
RBC: 3.66 MIL/uL — ABNORMAL LOW (ref 3.87–5.11)
RDW: 17.6 % — ABNORMAL HIGH (ref 11.5–15.5)
WBC: 10.3 10*3/uL (ref 4.0–10.5)

## 2017-04-02 LAB — GLUCOSE, CAPILLARY
Glucose-Capillary: 132 mg/dL — ABNORMAL HIGH (ref 65–99)
Glucose-Capillary: 147 mg/dL — ABNORMAL HIGH (ref 65–99)
Glucose-Capillary: 216 mg/dL — ABNORMAL HIGH (ref 65–99)
Glucose-Capillary: 100 mg/dL — ABNORMAL HIGH (ref 65–99)

## 2017-04-02 LAB — TROPONIN I: Troponin I: 0.04 ng/mL (ref <0.03)

## 2017-04-02 MED ORDER — IOPAMIDOL (ISOVUE-300) INJECTION 61%
INTRAVENOUS | Status: AC
  Administered 2017-04-02: 8 mL
  Filled 2017-04-02: qty 50

## 2017-04-02 MED ORDER — PROMETHAZINE HCL 25 MG/ML IJ SOLN
12.5000 mg | Freq: Once | INTRAMUSCULAR | Status: AC
  Administered 2017-04-02: 12.5 mg via INTRAVENOUS
  Filled 2017-04-02: qty 1

## 2017-04-02 NOTE — Procedures (Signed)
S/p right nephrostogram  Patent collecting system and ureter  neph tube removed

## 2017-04-02 NOTE — Progress Notes (Signed)
CRITICAL VALUE ALERT  Critical Value:  Troponin 0.04  Date & Time Notied:  04/02/2017 0315   Provider Notified: Lamar Blinks NP  Orders Received/Actions taken: none at this time

## 2017-04-02 NOTE — Progress Notes (Signed)
TCTS BRIEF PROGRESS NOTE:  Events of last few weeks noted and discussed w/ Dr Aundra Dubin Patient is not currently a candidate for surgical treatment of severe rheumatic mitral regurgitation  Plan: Will defer plans for f/u of valvular heart disease to the advanced heart failure team and continue to hope that the patient might improve with time and aggressive medical care.  Please call if we can be of further assistance.  Rexene Alberts, MD 04/02/2017 3:43 PM

## 2017-04-02 NOTE — Progress Notes (Signed)
Report called to [facility]. Patient is transferring to 3E07. Vital signs are currently stable. Patients family is at the bedside visiting.

## 2017-04-02 NOTE — Progress Notes (Signed)
Patient arrived from [place] in stable condition. Weight and vitals obtained and documented. Patient alert and oriented. No complaints at this time.

## 2017-04-02 NOTE — Progress Notes (Signed)
Patient is currently off of the unit in IR to assess Nephro tube status and need. Patient has been alert with confusion noted this am. Patient has noted anxiety and is tearful at times. Patient has been OOB to bedside commode and recliner chair. Resident also ambulated with PT to the door and back to bed. Vitals are currently stable will continue to monitor. Also note HF team came to see patient and still is not a candidate for intervention due to renal function and poor dentition.

## 2017-04-02 NOTE — Progress Notes (Signed)
Occupational Therapy Treatment Patient Details Name: Evalette Montrose. [place] MRN: [id number] DOB: [birth year] Today's Date: 04/02/2017    History of present illness Pt adm with Acute Respiratory failure with Hypoxia due to pulm edema and B Pleural effusions and Acute Diastolic CHF. Now with new retroperitoneal bleed. s/p right nephrostomy tube 9/29. PMH - ckd, dm, depression, cva   OT comments  Pt progressing towards OT goals this session, Pt able to perform seated grooming tasks and BSC transfer. Pt continues to demonstrate decreased independence in ADL and is currently +2 for transfers/mobility due to line management and chair follow. OT will continue to follow acutely and SNF remains appropriate.   Follow Up Recommendations  SNF;Supervision/Assistance - 24 hour    Equipment Recommendations  Other (comment) (defer to next venue)    Recommendations for Other Services      Precautions / Restrictions Precautions Precautions: Fall Restrictions Weight Bearing Restrictions: No       Mobility Bed Mobility Overal bed mobility: Needs Assistance Bed Mobility: Rolling;Sit to Sidelying Rolling: Min assist       Sit to sidelying: Mod assist;HOB elevated General bed mobility comments: assist for BLE lifted back into bed, min A with vc for rolling to the left  Transfers Overall transfer level: Needs assistance Equipment used: Rolling walker (2 wheeled) Transfers: Sit to/from [facility] Sit to Stand: Min assist Stand pivot transfers: Min assist;+2 safety/equipment (for line management)       General transfer comment: min A for balance and boost from sitting    Balance Overall balance assessment: Needs assistance Sitting-balance support: Feet supported;Single extremity supported Sitting balance-Leahy Scale: Poor Sitting balance - Comments: UE support required   Standing balance support: Bilateral upper extremity supported Standing balance-Leahy Scale: Poor Standing  balance comment: reliant on RW for balance                           ADL either performed or assessed with clinical judgement   ADL Overall ADL's : Needs assistance/impaired     Grooming: Set up;Wash/dry hands;Wash/dry face;Sitting Grooming Details (indicate cue type and reason): in recliner                 Toilet Transfer: Minimal assistance;Stand-pivot;BSC;RW;+2 for safety/equipment Toilet Transfer Details (indicate cue type and reason): +2 for line management Toileting- Clothing Manipulation and Hygiene: Maximal assistance;Sit to/from stand Toileting - Clothing Manipulation Details (indicate cue type and reason): Pt reliant on RW for balance, therapist performed rear peri care     Functional mobility during ADLs: Minimal assistance;Rolling walker;+2 for safety/equipment General ADL Comments: Pt complaining of being constantly cold throughout session     Vision       Perception     Praxis      Cognition Arousal/Alertness: Awake/alert Behavior During Therapy: WFL for tasks assessed/performed;Flat affect Overall Cognitive Status: Within Functional Limits for tasks assessed                                 General Comments: Today was more participatory with therapy, and willing to mobilize, no tears        Exercises     Shoulder Instructions       General Comments      Pertinent Vitals/ Pain       Pain Assessment: Faces Faces Pain Scale: Hurts even more Pain Location: back Pain Descriptors / Indicators: Discomfort;Grimacing Pain Intervention(s):  Monitored during session;Repositioned;Premedicated before session  Home Living                                          Prior Functioning/Environment              Frequency  Min 2X/week        Progress Toward Goals  OT Goals(current goals can now be found in the care plan section)  Progress towards OT goals: Progressing toward goals  Acute Rehab OT  Goals Patient Stated Goal: to get warm again OT Goal Formulation: With patient Time For Goal Achievement: 04/13/17 Potential to Achieve Goals: [place] Discharge plan remains appropriate;Frequency remains appropriate    Co-evaluation    PT/OT/SLP Co-Evaluation/Treatment: Yes Reason for Co-Treatment: For patient/therapist safety;To address functional/ADL transfers   OT goals addressed during session: ADL's and self-care      AM-PAC PT "6 Clicks" Daily Activity     Outcome Measure   Help from another person eating meals?: None Help from another person taking care of personal grooming?: A Little Help from another person toileting, which includes using toliet, bedpan, or urinal?: A Little Help from another person bathing (including washing, rinsing, drying)?: A Lot Help from another person to put on and taking off regular upper body clothing?: A Little Help from another person to put on and taking off regular lower body clothing?: A Lot 6 Click Score: 17    End of Session Equipment Utilized During Treatment: Rolling walker  OT Visit Diagnosis: Other abnormalities of gait and mobility (R26.89);Muscle weakness (generalized) (M62.81);Other symptoms and signs involving cognitive function   Activity Tolerance Patient tolerated treatment well   Patient Left in bed;with call bell/phone within reach   Nurse Communication Mobility status;Patient requests pain meds        Time: 1040-1107 OT Time Calculation (min): 27 min  Charges: OT General Charges $OT Visit: 1 Visit OT Treatments $Self Care/Home Management : 8-22 mins  Hulda Humphrey OTR/L [place] 04/02/2017, 1:13 PM

## 2017-04-02 NOTE — Progress Notes (Addendum)
Advanced Heart Failure Rounding Note  Primary Cardiologist: Dr. Bettina Gavia Primary HF: New (Dr. Aundra Dubin)  Subjective:    9/25 CT abd with R psoas retroperitoneal bleed. Heparin stopped.   9/25/ S/P R thoracentesis --> 0.35 liters removed.   Diuretics stopped 03/25/17. WBC up to 42. Hgb 7.1 this am.  BCx + for gram negative rods -> E. Coli bacteremia. On rocephin.   Denies SOB.    Objective:   Weight Range: 126 lb 12.2 oz (57.5 kg) Body mass index is 18.72 kg/m.   Vital Signs:   Temp:  [98.4 F (36.9 C)-98.9 F (37.2 C)] 98.7 F (37.1 C) (10/04 0733) Pulse Rate:  [36-78] 57 (10/04 0733) Resp:  [11-23] 18 (10/04 0420) BP: (137-178)/(64-112) 149/73 (10/04 0733) SpO2:  [95 %-100 %] 100 % (10/04 0733) Weight:  [126 lb 12.2 oz (57.5 kg)] 126 lb 12.2 oz (57.5 kg) (10/04 0339) Last BM Date: 04/01/17  Weight change: Filed Weights   03/31/17 0434 04/01/17 0407 04/02/17 0339  Weight: 130 lb 8.2 oz (59.2 kg) 125 lb (56.7 kg) 126 lb 12.2 oz (57.5 kg)    Intake/Output:   Intake/Output Summary (Last 24 hours) at 04/02/17 0957 Last data filed at 04/02/17 0345  Gross per 24 hour  Intake               40 ml  Output             1515 ml  Net            -1475 ml      Physical Exam   General:  Appears fatigued. Sitting in the chair. NAD.  HEENT: normal Neck: supple. No JVD. Carotids 2+ bilat; no bruits. No lymphadenopathy or thryomegaly appreciated. Cor: PMI nondisplaced. Regular rate & rhythm. No rubs, gallops or murmurs. Lungs: clear Abdomen: soft, nontender, nondistended. No hepatosplenomegaly. No bruits or masses. Good bowel sounds. Extremities: no cyanosis, clubbing, rash, edema Neuro: alert & orientedx3, cranial nerves grossly intact. moves all 4 extremities w/o difficulty. Affect pleasant GU: foley     Telemetry   Junctional Rhythm personally reviewed.    EKG    Aflutter with RVR 03/21/17.  Labs    CBC  Recent Labs  04/01/17 0359 04/02/17 0352  WBC  10.6* 10.3  HGB 10.4* 9.7*  HCT 30.8* 29.3*  MCV 79.6 80.1  PLT 246 650   Basic Metabolic Panel  Recent Labs  04/01/17 0359 04/01/17 1500 04/02/17 0352  NA 137  --  137  K 3.2* 4.1 4.0  CL 99*  --  105  CO2 24  --  20*  GLUCOSE 120*  --  139*  BUN 35*  --  34*  CREATININE 3.62*  --  3.54*  CALCIUM 9.0  --  9.0  MG 1.6*  --  2.1  PHOS 3.5  --  3.0   Liver Function Tests  Recent Labs  04/01/17 0359 04/02/17 0352  ALBUMIN 2.9* 2.9*   No results for input(s): LIPASE, AMYLASE in the last 72 hours. Cardiac Enzymes  Recent Labs  04/01/17 2200  TROPONINI 0.04*    BNP: BNP (last 3 results)  Recent Labs  03/12/17 1400  BNP 2,230.5*    ProBNP (last 3 results) No results for input(s): PROBNP in the last 8760 hours.   D-Dimer No results for input(s): DDIMER in the last 72 hours. Hemoglobin A1C No results for input(s): HGBA1C in the last 72 hours. Fasting Lipid Panel No results for input(s): CHOL, HDL,  LDLCALC, TRIG, CHOLHDL, LDLDIRECT in the last 72 hours. Thyroid Function Tests No results for input(s): TSH, T4TOTAL, T3FREE, THYROIDAB in the last 72 hours.  Invalid input(s): FREET3  Other results:   Imaging    No results found.   Medications:     Scheduled Medications: . amiodarone  200 mg Oral BID  . aspirin  81 mg Oral Daily  . ferrous sulfate  325 mg Oral BID WC  . hydrALAZINE  75 mg Oral Q8H  . insulin aspart  0-15 Units Subcutaneous TID WC  . insulin glargine  4 Units Subcutaneous Daily  . levothyroxine  112 mcg Oral QAC breakfast  . mouth rinse  15 mL Mouth Rinse BID  . metoprolol tartrate  5 mg Intravenous Q6H  . pantoprazole  40 mg Oral Daily  . polyethylene glycol  17 g Oral BID  . senna-docusate  1 tablet Oral BID  . sodium chloride flush  10-40 mL Intracatheter Q12H  . vitamin C  250 mg Oral BID    Infusions: . cefTRIAXone (ROCEPHIN)  IV 2 g (04/01/17 1803)    PRN Medications: acetaminophen **OR** acetaminophen,  bisacodyl, fentaNYL (SUBLIMAZE) injection, hydrALAZINE, levalbuterol, LORazepam, ondansetron (ZOFRAN) IV, sodium chloride flush    Patient Profile    Jocelyn Hill is a 66 y.o. female with h/o MR, Diastolic CHF, Afib, CAD s/p MI, long standing HTN, DM2, CKD IV-V, h/o Stroke. Anxiety, depression and GERD.   Pt admitted 03/12/17 with acute hypoxemic respiratory failure and CHF. HF team consulted to optimized prior to MVR consideration.   Assessment/Plan   1. 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  - Dr. Roxy Manns recommends treatment of CHF, Afib, ARF with trialysis cath placement, cardiac cath (L/R), and dental service consultation prior to surgery consideration.  - Now too ill to consider at this time.  2. Acute on chronic diastolic CHF - Off diuretics.  3. PAF/Flutter - Converted to NSR on amio 03/23/17. Maintaining NSR. No change. - Off heparin with hematoma.  - Continue SCDs for DVT prophylaxis.  - This patients CHA2DS2-VASc Score is at least 8 CHF, HTN, Age, DM, Stroke, and female).  4. CAD s/p MI x 2. - Pt states last MI was 2012-2013. She has not had ischemic work up since then to her recollection. No chest pain.  - Trop peak to 0.11. Likely from demand ischemia from CHF, but needs cath once stabilized prior to MVR consideration. Needs multiple extractions.  5. Bradycardia -Resolved.   - s/p temp pacemaker 6. ARF - Nephrology signed off. Making urine. Creatinine 3.4   7. HTN - Continue hydralazine 75 mg tid.  Contine norvasc 10 mg daily. 8. Metabolic encephalopathy  Thought to be related to ARF. No cahnge.  9. R pleural effusion - S/ P R thoracentesis--> -0.35 liters.  10. Hyperkalemia -resolved.  11. Retroperitoneal Hematoma- Had CT of abdomen 9/25 with R Psoas muscle hematoma. Hgb now stable.9.7 She is off anticoagulants.  12. ID- WBC  10.3  - BCx positive for E. Coli bacteremia. - on rocephin. .  13. Hyponatremia- sodium  137. Watch closely.  14. R Hydronephrosis - Secondary to [facility].  R  nephrostomy tube.   Per Dr Aundra Dubin she is not a candidate for LHC with elevated creatinine (3.4). No chest pain. She is not on dialysis and continues to make urine . Nephrology signed off.   PT recommending SNF.    Length of Stay: [place], NP  04/02/2017, 9:57 AM  Advanced Heart Failure Team Pager [phone] (M-F; 7a - 4p)  Please contact [facility] for night-coverage after hours (4p -7a ) and weekends on [email or web address]  Patient seen with NP, agree with the above note.   Creatinine slowly trending down but still 3.5.  She is making good urine and is off diuretics.  Still has nephrostomy tube in place.   She ultimately needs mitral valve surgery but would like to see more recovery of renal function and also needs PT work/improved nutrition.  Would plan on getting her to a rehab facility, seeing how she does.  Would not do pre-op cath until we see further creatinine improvement and she is getting close to surgery.  This will be a while, not likely this admission.  - She will likely need at least a low dose of diuretic long-term, can hold off for now with good UOP and does not appear volume overloaded.   Amiodarone increased yesterday to 200 mg bid with frequent ectopy.  Very rare PVCs today.  Still appears to be in a junctional rhythm.   - Would decrease amiodarone back to 200 mg daily.   Ultimately should be anticoagulated for paroxysmal atrial fibrillation but with recent RP hematoma, would hold off on this for a couple more weeks to allow stabilization.   We will follow at a distance.   [name] 04/02/2017 10:22 AM

## 2017-04-02 NOTE — Progress Notes (Signed)
Physical Therapy Treatment Patient Details Name: Jocelyn Hill. Forgione MRN: [id number] DOB: Nov 03, 1950 Today's Date: 04/02/2017    History of Present Illness Pt adm with Acute Respiratory failure with Hypoxia due to pulm edema and B Pleural effusions and Acute Diastolic CHF. Now with new retroperitoneal bleed. s/p right nephrostomy tube 9/29. PMH - ckd, dm, depression, cva    PT Comments    Pt making slow, steady progress. Able to amb to door of room. Continues to need encouragement to move more.   Follow Up Recommendations  SNF     Equipment Recommendations  Rolling walker with 5" wheels    Recommendations for Other Services       Precautions / Restrictions Precautions Precautions: Fall Restrictions Weight Bearing Restrictions: No    Mobility  Bed Mobility Overal bed mobility: Needs Assistance Bed Mobility: Rolling;Sit to Sidelying Rolling: Min assist       Sit to sidelying: Mod assist;HOB elevated General bed mobility comments: assist for BLE lifted back into bed, min A with vc for rolling to the left  Transfers Overall transfer level: Needs assistance Equipment used: Rolling walker (2 wheeled) Transfers: Sit to/from [facility] Sit to Stand: Min assist Stand pivot transfers: Min assist;+2 safety/equipment (for line management)       General transfer comment: min A for balance and boost from sitting  Ambulation/Gait Ambulation/Gait assistance: Min assist;+2 safety/equipment (Follow closely with chair) Ambulation Distance (Feet): 12 Feet Assistive device: Rolling walker (2 wheeled) Gait Pattern/deviations: Step-through pattern;Decreased step length - right;Decreased step length - left;Shuffle;Trunk flexed Gait velocity: decr Gait velocity interpretation: Below normal speed for age/gender General Gait Details: Assist for balance and support. Pt with bil knees beginning to buckle several times. Able to resume full standing with knee extension with min  assist   Stairs            Wheelchair Mobility    Modified Rankin (Stroke Patients Only)       Balance Overall balance assessment: Needs assistance Sitting-balance support: Feet supported;Single extremity supported Sitting balance-Leahy Scale: Poor Sitting balance - Comments: UE support required   Standing balance support: Bilateral upper extremity supported Standing balance-Leahy Scale: Poor Standing balance comment: walker and min assist for static standing                            Cognition Arousal/Alertness: Awake/alert Behavior During Therapy: Flat affect Overall Cognitive Status: Within Functional Limits for tasks assessed                                 General Comments: Today was more participatory with therapy, and willing to mobilize, no tears      Exercises      General Comments        Pertinent Vitals/Pain Pain Assessment: Faces Faces Pain Scale: Hurts even more Pain Location: back Pain Descriptors / Indicators: Discomfort;Grimacing Pain Intervention(s): Monitored during session;Premedicated before session;Repositioned    Home Living                      Prior Function            PT Goals (current goals can now be found in the care plan section) Acute Rehab PT Goals Patient Stated Goal: to get warm again Progress towards PT goals: Progressing toward goals    Frequency    Min 3X/week  PT Plan Current plan remains appropriate    Co-evaluation PT/OT/SLP Co-Evaluation/Treatment: Yes Reason for Co-Treatment: For patient/therapist safety PT goals addressed during session: Mobility/safety with mobility OT goals addressed during session: ADL's and self-care      AM-PAC PT "6 Clicks" Daily Activity  Outcome Measure  Difficulty turning over in bed (including adjusting bedclothes, sheets and blankets)?: Unable Difficulty moving from lying on back to sitting on the side of the bed? :  Unable Difficulty sitting down on and standing up from a chair with arms (e.g., wheelchair, bedside commode, etc,.)?: Unable Help needed moving to and from a bed to chair (including a wheelchair)?: A Little Help needed walking in hospital room?: A Little Help needed climbing 3-5 steps with a railing? : Total 6 Click Score: 10    End of Session   Activity Tolerance: Patient limited by fatigue Patient left: in bed;with call bell/phone within reach;with bed alarm set Nurse Communication: Mobility status PT Visit Diagnosis: Other abnormalities of gait and mobility (R26.89);Muscle weakness (generalized) (M62.81);Unsteadiness on feet (R26.81)     Time: 6314-9702 PT Time Calculation (min) (ACUTE ONLY): 27 min  Charges:  $Gait Training: 8-22 mins                    G Codes:       [facility] PT [place] 04/02/2017, 1:29 PM

## 2017-04-02 NOTE — Progress Notes (Signed)
PROGRESS NOTE    Jocelyn Hill  ZOX:[id number] DOB: 01-Oct-1950 DOA: 03/12/2017 PCP: [name], MD   Brief Narrative:  66 y.o. BF PMHx Anxiety, Depression, CVA, poorly controlled HTN, CKD stage III, DM type II with Peripheral Neuropathy, Hypothyroidism    Who states that over the past month, she is noted she has become more winded. Then starting on Saturday, 9/8, patient cut the point where she felt like she is having a lot of trouble breathing. The symptoms continued to progress to the point where she could not take it anymore and she came in to the emergency room at [facility] for further evaluation. Patient found to have a BNP of 1700 with a new diagnosis of CHF. Chest x-ray noted moderate bilateral pleural effusions. Patient was given Lasix and put on nitroglycerin patch. Blood pressure systolic in the 811B-147W. Patient initially BiPAP to keep oxygen saturations greater than 90%. In addition, creatinine noted to be 3.2 or baseline was 2.1. Because of worsening creatinine and decompensated CHF, it was felt best to transfer this patient to [facility]. Hospitalists accepted transfer.    Subjective: 10 /4 A/O 4, negative CP, negative SOB, negative abdominal pain,negative N/V. Sitting on the edge of bed. Appears much more comfortable         Assessment & Plan:   Principal Problem:   Acute on chronic respiratory failure with hypoxia (HCC) Active Problems:   Hypertension   Hypothyroidism   Diet-controlled diabetes mellitus (HCC)   Acute diastolic (congestive) heart failure (HCC)   AKI (acute kidney injury) (HCC)   CKD (chronic kidney disease), stage III (HCC)   CHF (congestive heart failure) (HCC)   Dysphagia   Malignant hypertension   Microcytic anemia   Severe mitral regurgitation   CKD (chronic kidney disease), stage IV (HCC)   Substance abuse (HCC)   Acute diastolic CHF (congestive heart failure) (HCC)   Pulmonary hypertension (HCC)   Asystole (HCC)  Non-rheumatic mitral regurgitation   Acute renal failure superimposed on stage 3 chronic kidney disease (HCC)   Anemia due to chronic kidney disease   Controlled diabetes mellitus type 2 with complications (HCC)   Acute encephalopathy   Sepsis/ positive Klebsiella pneumoniae/Escherichia coli pyelonephritis/  positive Escherichia coli Bacteremia -Patient with pyelonephritis and bacteremia will require minimal 2 weeks antibiotics. -10/4 blood / urine redraw cultures pending. -Continue IV antibiotics per ID  Acute Respiratory failure with Hypoxia/Pulmonary Vascular congestion -resolved, ambulating on room air   Substance abuse -UDS positive for benzodiazepine, opiates, marijuana. Unable to differentiate if positive prior to hospitalization for Benzodiazepine/Opiates.   Acute Diastolic CHF -Strict in and out since admission since admission -27 L -Monitor CVP. Patient fluid overloaded. 10/3 CVP = ?? -Daily weight Filed Weights   03/31/17 0434 04/01/17 0407 04/02/17 0339  Weight: 130 lb 8.2 oz (59.2 kg) 125 lb (56.7 kg) 126 lb 12.2 oz (57.5 kg)  -amiodarone 200 mg BID -hydralazine 75 mg  TID -hydralazine  PRN -metoprolol IV 5 mg QID -monitor closely for bradycardia. Patient continues to have trigeminy, bigeminy, bradycardia, sinus rhythm with PVC.  -Cardiac catheterization, appears to be on hold until further improvement in renal function.   Pulmonary HTN  -See CHF   Bradycardia/Asystole/ -See CHF  Severe Mitral Valve regurgitation -Per cardiology, requires Dental consult prior to MV replacement. Unfortunately adult dental services -Jorja Loa DDS. Concurs multiple teeth need to be removed. Per his note appears patient was on the fence about having her teeth removed, however Has agreed  to removal of teeth. -10/4 Reconsulted on call adult dentist Koeling DDS who has agreed to evaluate patient and arrange for extraction of appropriate teeth.  Paroxysmal atrial  fibrillation/flutter -See CHF -All anticoagulants discontinued secondary to retroperitoneal bleed   Acute on CKD stage III   Recent Labs Lab 03/28/17 2330 03/29/17 1156 03/30/17 0603 03/31/17 0416 04/01/17 0359 04/02/17 0352  CREATININE 5.00* 4.64* 4.36* 4.11* 3.62* 3.54*  -10/4 discussed case Dr. Jamal Maes Nephrology, and she believes if we re-equilibration will get some additional improvement in renal function. Does not want to actively hydrate.  Right Hydronephrosis -Repeat CT scan shows RIGHT hydronephrosis secondary to RIGHT ureteral obstruction secondary to RIGHT Retroperitoneal bleed  -Discussed case with Dr. Lafayette Dragon [facility]: RIGHT nephrostomy tube placed per recommendation, -10/4 nephrostomy tube discontinued   Chronic anemia(Mixed anemia)/Acute Blood Loss anemia -No overt sign of bleeding -Anemia panel: Although MCV 76, panel not consistent pure microcytic anemia.   Recent Labs Lab 03/29/17 1156 03/29/17 1910 03/30/17 0616 03/31/17 0416 04/01/17 0359 04/02/17 0352  HGB 6.8* 7.8* 9.5* 8.9* 10.4* 9.7*  -Continue Fe 325 mg BID + vitamin C 250 mg BID -Bleeding appears to have stabilized.   Diabetes type 2 controlled with, complication -4/09 Hemoglobin A1c = 6.2 -Lantus 4 units daily   -Moderate SSI  Hypothyroidism -Synthroid 112 g daily   Acute encephalopathy  -Resolved  Hypokalemia -Potassium goal> 4  Hypomagnesemia -Magnesium goal> 2  Right retroperitoneal hematoma/Abdominal pain -acute RUQ abdominal pain with Alvarado score>4 -9/25 Abdominal CT; showed right retroperitoneal bleed see results below  Goals of care -10/4 PT/OT consult: Evaluate for LTAC vs CIR vs SNF placement. IV antibiotics with multiple upcoming procedures teeth extraction, cardiac catheterization, and mitral valve replacement    DVT prophylaxis: SCD Code Status: Full Family Communication: None Disposition Plan: TBD   Consultants:   Cardiology Nephrology [facility]     Procedures/Significant Events:  9/13 Admit 9/13 Echocardiogram: LVEF= 60-65%. -Grade 2 diastolic dysfunction.-Severe MR.-Mildly dilated LA and RA.-, mild to mod TR,. PAP 60 mm Hg. 9/18 TEE: EF 55-60%.-Severe restriction anterior/posterior leaflet MV w/ severe regurgitation  9/17 CXR: Bilateral pleural effusion.-Mild pulmonary interstitial edema.-Cardiomegaly  9/17 Brady/hypotensive/ intubated > transfer to ICU 9/20 patient to have pauses longest 6.7 seconds/bradycardia. All nodal blocking agents and NTG drips discontinued. 3 Amps Atropine administered.  9/25 Abdominal/pelvis CT:-RIGHT retroperitoneal hematoma extending into the right side of pelvis and right groin region. -Hematocrit level isidentified within the asymmetrically enlarged right psoas muscle hematoma. - Appendix is normal. -Gallbladder hydrops. Etiology and clinical significance indeterminate.  -Bilateral pleural effusions with diminished aeration to the lung bases. 9/25 RIGHT Thoracentesis; 0.35 L of pleural fluid 9/27 PCXR: Continue left pleural effusion/LLL atelectasis improved 9/27 CT abdomen pelvis WO contrast:-Stable RIGHT retroperitoneal hematoma.-Moderate RIGHT hydronephrosis most likely secondary to obstruction distal ureter secondary to hematoma -Dilated gallbladder without definite surrounding inflammation 9/28 RIGHT percutaneous nephrostomy tube      I have personally reviewed and interpreted all radiology studies and my findings are as above.  VENTILATOR SETTINGS: None   Cultures 9/25 RIGHT pleural fluid NGTD 9/27 urine positive Klebsiella pneumoniae/Escherichia coli 9/27 blood positive Escherichia coli 10/4 urine pending 10/4 blood pending     Antimicrobials:Anti-infectives    Start     Stop   03/27/17 1800  cefTRIAXone (ROCEPHIN) 2 g in dextrose 5 % 50 mL IVPB         03/27/17 1500  ciprofloxacin (CIPRO) 400 MG/200ML IVPB    Comments:  Duininck, Stacey   : cabinet override   03/27/17 1525   03/26/17 1200  piperacillin-tazobactam (ZOSYN) IVPB 2.25 g  Status:  Discontinued     03/27/17 1722   03/26/17 1200  vancomycin (VANCOCIN) IVPB 750 mg/150 ml premix  Status:  Discontinued     03/27/17 0917   03/26/17 1145  piperacillin-tazobactam (ZOSYN) IVPB 3.375 g  Status:  Discontinued     03/26/17 1138   03/26/17 1145  vancomycin (VANCOCIN) IVPB 1000 mg/200 mL premix  Status:  Discontinued     03/26/17 1138   03/16/17 1000  azithromycin (ZITHROMAX) tablet 250 mg  Status:  Discontinued     03/16/17 1316   03/14/17 0900  azithromycin (ZITHROMAX) 500 mg in dextrose 5 % 250 mL IVPB  Status:  Discontinued     03/16/17 0804        Devices     LINES / TUBES:  Right nephrostomy tube 9/28>>> 10/4    Continuous Infusions: . cefTRIAXone (ROCEPHIN)  IV Stopped (04/01/17 1833)     Objective: Vitals:   04/02/17 0700 04/02/17 0733 04/02/17 1300 04/02/17 1600  BP:  (!) 149/73 (!) 158/72 130/60  Pulse:  (!) 57    Resp:      Temp: 98.7 F (37.1 C) 98.7 F (37.1 C) 98.7 F (37.1 C) 98.4 F (36.9 C)  TempSrc: Oral Oral Oral Oral  SpO2:  100%    Weight:      Height:        Intake/Output Summary (Last 24 hours) at 04/02/17 1638 Last data filed at 04/02/17 1620  Gross per 24 hour  Intake              270 ml  Output             1890 ml  Net            -1620 ml   Filed Weights   03/31/17 0434 04/01/17 0407 04/02/17 0339  Weight: 130 lb 8.2 oz (59.2 kg) 125 lb (56.7 kg) 126 lb 12.2 oz (57.5 kg)     Physical Exam:   General: A/O 4, negative acute respiratory distress Neck:  Negative scars, masses, torticollis, lymphadenopathy, JVD Lungs: Clear to auscultation bilaterally without wheezes or crackles Cardiovascular: Regular rate and rhythm without murmur gallop or rub normal S1 and S2 Abdomen: negative abdominal pain, nondistended, positive soft, bowel sounds, no rebound, no ascites, no appreciable mass. Right  lateral bandage covering area were right nephrostomy tube removed negative sign of bleeding. Extremities: No significant cyanosis, clubbing, or edema bilateral lower extremities Skin: Negative rashes, lesions, ulcers Psychiatric:  Negative depression, positive anxiety, negative fatigue, negative mania  Central nervous system:  Cranial nerves II through XII intact, tongue/uvula midline, all extremities muscle strength 5/5, sensation intact throughout, negative dysarthria, negative expressive aphasia, negative receptive aphasia.      Data Reviewed: Care during the described time interval was provided by me .  I have reviewed this patient's available data, including medical history, events of note, physical examination, and all test results as part of my evaluation.   CBC:  Recent Labs Lab 03/29/17 1910 03/30/17 0616 03/31/17 0416 04/01/17 0359 04/02/17 0352  WBC 13.4* 10.2 10.8* 10.6* 10.3  HGB 7.8* 9.5* 8.9* 10.4* 9.7*  HCT 23.2* 28.2* 25.8* 30.8* 29.3*  MCV 78.1 78.3 78.7 79.6 80.1  PLT 149* 148* 194 246 867   Basic Metabolic Panel:  Recent Labs Lab 03/26/17 2147  03/28/17 2330 03/29/17 1156 03/30/17 0603 03/31/17 0416  04/01/17 0359 04/01/17 1500 04/02/17 0352  NA  --   < > 127* 132* 138 137 137  --  137  K 5.7*  < > 4.1 3.6 3.7 2.7* 3.2* 4.1 4.0  CL  --   < > 94* 98* 97* 99* 99*  --  105  CO2  --   < > 21* 22 24 24 24   --  20*  GLUCOSE  --   < > 147* 186* 115* 119* 120*  --  139*  BUN  --   < > 51* 52* 49* 43* 35*  --  34*  CREATININE  --   < > 5.00* 4.64* 4.36* 4.11* 3.62*  --  3.54*  CALCIUM  --   < > 8.2* 8.2* 8.9 8.5* 9.0  --  9.0  MG 1.6*  --  1.8  --   --   --  1.6*  --  2.1  PHOS  --   < >  --  4.2 4.1 3.3 3.5  --  3.0  < > = values in this interval not displayed. GFR: Estimated Creatinine Clearance: 14.4 mL/min (A) (by C-G formula based on SCr of 3.54 mg/dL (H)). Liver Function Tests:  Recent Labs Lab 03/28/17 2330 03/29/17 1156 03/30/17 0603  03/31/17 0416 04/01/17 0359 04/02/17 0352  AST 48*  --   --   --   --   --   ALT 90*  --   --   --   --   --   ALKPHOS 129*  --   --   --   --   --   BILITOT 1.4*  --   --   --   --   --   PROT 5.7*  --   --   --   --   --   ALBUMIN 2.7* 2.5* 2.6* 2.6* 2.9* 2.9*   No results for input(s): LIPASE, AMYLASE in the last 168 hours. No results for input(s): AMMONIA in the last 168 hours. Coagulation Profile:  Recent Labs Lab 03/26/17 2147  INR 1.34   Cardiac Enzymes:  Recent Labs Lab 04/01/17 2200  TROPONINI 0.04*   BNP (last 3 results) No results for input(s): PROBNP in the last 8760 hours. HbA1C: No results for input(s): HGBA1C in the last 72 hours. CBG:  Recent Labs Lab 04/01/17 1624 04/01/17 2051 04/02/17 0735 04/02/17 1246 04/02/17 1618  GLUCAP 199* 147* 132* 147* 216*   Lipid Profile: No results for input(s): CHOL, HDL, LDLCALC, TRIG, CHOLHDL, LDLDIRECT in the last 72 hours. Thyroid Function Tests: No results for input(s): TSH, T4TOTAL, FREET4, T3FREE, THYROIDAB in the last 72 hours. Anemia Panel: No results for input(s): VITAMINB12, FOLATE, FERRITIN, TIBC, IRON, RETICCTPCT in the last 72 hours. Urine analysis:    Component Value Date/Time   COLORURINE YELLOW 03/26/2017 1539   APPEARANCEUR CLOUDY (A) 03/26/2017 1539   LABSPEC 1.010 03/26/2017 1539   PHURINE 6.5 03/26/2017 1539   GLUCOSEU NEGATIVE 03/26/2017 1539   HGBUR LARGE (A) 03/26/2017 1539   BILIRUBINUR NEGATIVE 03/26/2017 1539   KETONESUR NEGATIVE 03/26/2017 1539   PROTEINUR >300 (A) 03/26/2017 1539   NITRITE NEGATIVE 03/26/2017 1539   LEUKOCYTESUR LARGE (A) 03/26/2017 1539   Sepsis Labs: @LABRCNTIP (procalcitonin:4,lacticidven:4)  ) Recent Results (from the past 240 hour(s))  Gram stain     Status: None   Collection Time: 03/24/17  4:23 PM  Result Value Ref Range Status   Specimen Description FLUID PLEURAL RIGHT  Final   Special Requests  NONE  Final   Gram Stain   Final    WBC PRESENT,  PREDOMINANTLY MONONUCLEAR NO ORGANISMS SEEN CYTOSPIN SMEAR    Report Status 03/24/2017 FINAL  Final  Fungus Culture With Stain     Status: None (Preliminary result)   Collection Time: 03/24/17  4:23 PM  Result Value Ref Range Status   Fungus Stain Final report  Final    Comment: (NOTE) Performed At: [facility] [place], [place] [id number] Lindon Romp MD [id number]    Fungus (Mycology) Culture PENDING  Incomplete   Fungal Source FLUID  Final    Comment: PLEURAL RIGHT   Acid Fast Smear (AFB)     Status: None   Collection Time: 03/24/17  4:23 PM  Result Value Ref Range Status   AFB Specimen Processing Concentration  Final   Acid Fast Smear Negative  Final    Comment: (NOTE) Performed At: [facility] [address] [place], [place] [id number] Lindon Romp MD OI:[id number]    Source (AFB) FLUID  Final    Comment: PLEURAL RIGHT   Culture, body fluid-bottle     Status: None   Collection Time: 03/24/17  4:23 PM  Result Value Ref Range Status   Specimen Description FLUID PLEURAL RIGHT  Final   Special Requests BOTTLES DRAWN AEROBIC AND ANAEROBIC  Final   Culture NO GROWTH 5 DAYS  Final   Report Status 03/29/2017 FINAL  Final  Fungus Culture Result     Status: None   Collection Time: 03/24/17  4:23 PM  Result Value Ref Range Status   Result 1 Comment  Final    Comment: (NOTE) KOH/Calcofluor preparation:  no fungus observed. Performed At: [facility] [place], [place] [id number] Lindon Romp MD JQ:[id number]   Urine Culture     Status: Abnormal   Collection Time: 03/26/17  8:07 AM  Result Value Ref Range Status   Specimen Description URINE, CATHETERIZED  Final   Special Requests NONE  Final   Culture (A)  Final    >=100,000 COLONIES/mL KLEBSIELLA PNEUMONIAE >=100,000 COLONIES/mL ESCHERICHIA COLI    Report Status 03/29/2017 FINAL  Final   Organism ID, Bacteria KLEBSIELLA PNEUMONIAE (A)  Final    Organism ID, Bacteria ESCHERICHIA COLI (A)  Final      Susceptibility   Escherichia coli - MIC*    AMPICILLIN 4 SENSITIVE Sensitive     CEFAZOLIN <=4 SENSITIVE Sensitive     CEFTRIAXONE <=1 SENSITIVE Sensitive     CIPROFLOXACIN <=0.25 SENSITIVE Sensitive     GENTAMICIN <=1 SENSITIVE Sensitive     IMIPENEM <=0.25 SENSITIVE Sensitive     NITROFURANTOIN <=16 SENSITIVE Sensitive     TRIMETH/SULFA >=320 RESISTANT Resistant     AMPICILLIN/SULBACTAM <=2 SENSITIVE Sensitive     PIP/TAZO <=4 SENSITIVE Sensitive     Extended ESBL NEGATIVE Sensitive     * >=100,000 COLONIES/mL ESCHERICHIA COLI   Klebsiella pneumoniae - MIC*    AMPICILLIN >=32 RESISTANT Resistant     CEFAZOLIN <=4 SENSITIVE Sensitive     CEFTRIAXONE <=1 SENSITIVE Sensitive     CIPROFLOXACIN <=0.25 SENSITIVE Sensitive     GENTAMICIN <=1 SENSITIVE Sensitive     IMIPENEM <=0.25 SENSITIVE Sensitive     NITROFURANTOIN 64 INTERMEDIATE Intermediate     TRIMETH/SULFA <=20 SENSITIVE Sensitive     AMPICILLIN/SULBACTAM 4 SENSITIVE Sensitive     PIP/TAZO <=4 SENSITIVE Sensitive     Extended ESBL NEGATIVE Sensitive     * >=100,000  COLONIES/mL KLEBSIELLA PNEUMONIAE  Culture, blood (routine x 2)     Status: None   Collection Time: 03/26/17 10:40 AM  Result Value Ref Range Status   Specimen Description BLOOD LEFT HAND  Final   Special Requests IN PEDIATRIC BOTTLE Blood Culture adequate volume  Final   Culture NO GROWTH 5 DAYS  Final   Report Status 03/31/2017 FINAL  Final  Culture, blood (routine x 2)     Status: Abnormal   Collection Time: 03/26/17 10:40 AM  Result Value Ref Range Status   Specimen Description BLOOD RIGHT HAND  Final   Special Requests IN PEDIATRIC BOTTLE Blood Culture adequate volume  Final   Culture  Setup Time   Final    GRAM NEGATIVE RODS IN PEDIATRIC BOTTLE CRITICAL RESULT CALLED TO, READ BACK BY AND VERIFIED WITH: G.ABBOTT PHARMD 03/27/17 0548 L.CHAMPION    Culture ESCHERICHIA COLI (A)  Final   Report Status  03/29/2017 FINAL  Final   Organism ID, Bacteria ESCHERICHIA COLI  Final      Susceptibility   Escherichia coli - MIC*    AMPICILLIN 4 SENSITIVE Sensitive     CEFAZOLIN <=4 SENSITIVE Sensitive     CEFEPIME <=1 SENSITIVE Sensitive     CEFTAZIDIME <=1 SENSITIVE Sensitive     CEFTRIAXONE <=1 SENSITIVE Sensitive     CIPROFLOXACIN <=0.25 SENSITIVE Sensitive     GENTAMICIN <=1 SENSITIVE Sensitive     IMIPENEM <=0.25 SENSITIVE Sensitive     TRIMETH/SULFA >=320 RESISTANT Resistant     AMPICILLIN/SULBACTAM <=2 SENSITIVE Sensitive     PIP/TAZO <=4 SENSITIVE Sensitive     Extended ESBL NEGATIVE Sensitive     * ESCHERICHIA COLI  Blood Culture ID Panel (Reflexed)     Status: Abnormal   Collection Time: 03/26/17 10:40 AM  Result Value Ref Range Status   Enterococcus species NOT DETECTED NOT DETECTED Final   Listeria monocytogenes NOT DETECTED NOT DETECTED Final   Staphylococcus species NOT DETECTED NOT DETECTED Final   Staphylococcus aureus NOT DETECTED NOT DETECTED Final   Streptococcus species NOT DETECTED NOT DETECTED Final   Streptococcus agalactiae NOT DETECTED NOT DETECTED Final   Streptococcus pneumoniae NOT DETECTED NOT DETECTED Final   Streptococcus pyogenes NOT DETECTED NOT DETECTED Final   Acinetobacter baumannii NOT DETECTED NOT DETECTED Final   Enterobacteriaceae species DETECTED (A) NOT DETECTED Final    Comment: Enterobacteriaceae represent a large family of gram-negative bacteria, not a single organism. CRITICAL RESULT CALLED TO, READ BACK BY AND VERIFIED WITH: G.ABBOTT PHARMD 03/27/17 0548 L.CHAMPION    Enterobacter cloacae complex NOT DETECTED NOT DETECTED Final   Escherichia coli DETECTED (A) NOT DETECTED Final    Comment: CRITICAL RESULT CALLED TO, READ BACK BY AND VERIFIED WITH: G.ABBOTT PHARMD 03/27/17 0548 L.CHAMPION    Klebsiella oxytoca NOT DETECTED NOT DETECTED Final   Klebsiella pneumoniae NOT DETECTED NOT DETECTED Final   Proteus species NOT DETECTED NOT DETECTED  Final   Serratia marcescens NOT DETECTED NOT DETECTED Final   Carbapenem resistance NOT DETECTED NOT DETECTED Final   Haemophilus influenzae NOT DETECTED NOT DETECTED Final   Neisseria meningitidis NOT DETECTED NOT DETECTED Final   Pseudomonas aeruginosa NOT DETECTED NOT DETECTED Final   Candida albicans NOT DETECTED NOT DETECTED Final   Candida glabrata NOT DETECTED NOT DETECTED Final   Candida krusei NOT DETECTED NOT DETECTED Final   Candida parapsilosis NOT DETECTED NOT DETECTED Final   Candida tropicalis NOT DETECTED NOT DETECTED Final  Culture, Urine     Status:  Abnormal   Collection Time: 03/26/17  3:38 PM  Result Value Ref Range Status   Specimen Description URINE, RANDOM  Final   Special Requests NONE  Final   Culture MULTIPLE SPECIES PRESENT, SUGGEST RECOLLECTION (A)  Final   Report Status 03/27/2017 FINAL  Final         Radiology Studies: Ir Nephro Tube Remov/fl  Result Date: 04/02/2017 INDICATION: Recent right nephrostogram for right hydronephrosis secondary to right retroperitoneal hematoma EXAM: ANTEGRADE NEPHROSTOGRAM THROUGH EXISTING NEPHROSTOMY MEDICATIONS: NONE. ANESTHESIA/SEDATION: NONE. COMPLICATIONS: None immediate. PROCEDURE: Under sterile conditions, the existing right nephrostomy was injected with contrast for an antegrade nephrostogram. Right nephrostogram: Catheter has retracted. Extravasation noted at the retroperitoneal access site. Contrast does opacify the collapsed collecting system without hydronephrosis. Contrast opacifies the ureter and eventually passes distally into the bladder without obstruction. There is deviation of the ureter medially related to the retroperitoneal hematoma. Nephrostomy catheter removed.  No immediate complication. IMPRESSION: Patent right collecting system and ureter without obstruction or hydronephrosis. Right nephrostomy removed. Electronically Signed   By: Jerilynn Mages.  Shick M.D.   On: 04/02/2017 12:36   Ir Nephrostogram Right Thru  Existing Access  Result Date: 04/02/2017 INDICATION: Recent right nephrostogram for right hydronephrosis secondary to right retroperitoneal hematoma EXAM: ANTEGRADE NEPHROSTOGRAM THROUGH EXISTING NEPHROSTOMY MEDICATIONS: NONE. ANESTHESIA/SEDATION: NONE. COMPLICATIONS: None immediate. PROCEDURE: Under sterile conditions, the existing right nephrostomy was injected with contrast for an antegrade nephrostogram. Right nephrostogram: Catheter has retracted. Extravasation noted at the retroperitoneal access site. Contrast does opacify the collapsed collecting system without hydronephrosis. Contrast opacifies the ureter and eventually passes distally into the bladder without obstruction. There is deviation of the ureter medially related to the retroperitoneal hematoma. Nephrostomy catheter removed.  No immediate complication. IMPRESSION: Patent right collecting system and ureter without obstruction or hydronephrosis. Right nephrostomy removed. Electronically Signed   By: Jerilynn Mages.  Shick M.D.   On: 04/02/2017 12:36        Scheduled Meds: . amiodarone  200 mg Oral BID  . aspirin  81 mg Oral Daily  . ferrous sulfate  325 mg Oral BID WC  . hydrALAZINE  75 mg Oral Q8H  . insulin aspart  0-15 Units Subcutaneous TID WC  . insulin glargine  4 Units Subcutaneous Daily  . levothyroxine  112 mcg Oral QAC breakfast  . mouth rinse  15 mL Mouth Rinse BID  . metoprolol tartrate  5 mg Intravenous Q6H  . pantoprazole  40 mg Oral Daily  . polyethylene glycol  17 g Oral BID  . senna-docusate  1 tablet Oral BID  . sodium chloride flush  10-40 mL Intracatheter Q12H  . vitamin C  250 mg Oral BID   Continuous Infusions: . cefTRIAXone (ROCEPHIN)  IV Stopped (04/01/17 1833)     LOS: 21 days    Time spent: 40 minutes    WOODS, Geraldo Docker, MD [facility] Pager [phone]   If 7PM-7AM, please contact night-coverage [email or web address] Password [facility] 04/02/2017, 4:38 PM

## 2017-04-03 LAB — GLUCOSE, CAPILLARY
Glucose-Capillary: 138 mg/dL — ABNORMAL HIGH (ref 65–99)
Glucose-Capillary: 172 mg/dL — ABNORMAL HIGH (ref 65–99)
Glucose-Capillary: 195 mg/dL — ABNORMAL HIGH (ref 65–99)
Glucose-Capillary: 173 mg/dL — ABNORMAL HIGH (ref 65–99)

## 2017-04-03 LAB — RENAL FUNCTION PANEL
Albumin: 2.9 g/dL — ABNORMAL LOW (ref 3.5–5.0)
Anion gap: 13 (ref 5–15)
BUN: 34 mg/dL — ABNORMAL HIGH (ref 6–20)
CO2: 20 mmol/L — ABNORMAL LOW (ref 22–32)
Calcium: 8.7 mg/dL — ABNORMAL LOW (ref 8.9–10.3)
Chloride: 102 mmol/L (ref 101–111)
Creatinine, Ser: 3.72 mg/dL — ABNORMAL HIGH (ref 0.44–1.00)
GFR calc Af Amer: 14 mL/min — ABNORMAL LOW (ref >60)
GFR calc non Af Amer: 12 mL/min — ABNORMAL LOW (ref >60)
Glucose, Bld: 149 mg/dL — ABNORMAL HIGH (ref 65–99)
Phosphorus: 4.1 mg/dL (ref 2.5–4.6)
Potassium: 3.7 mmol/L (ref 3.5–5.1)
Sodium: 135 mmol/L (ref 135–145)

## 2017-04-03 LAB — URINE CULTURE: Culture: NO GROWTH

## 2017-04-03 LAB — MAGNESIUM: Magnesium: 2 mg/dL (ref 1.7–2.4)

## 2017-04-03 MED ORDER — GLUCERNA SHAKE PO LIQD
237.0000 mL | Freq: Three times a day (TID) | ORAL | Status: DC
  Administered 2017-04-03 – 2017-04-04 (×3): 237 mL via ORAL

## 2017-04-03 MED ORDER — DIPHENOXYLATE-ATROPINE 2.5-0.025 MG PO TABS
2.0000 | ORAL_TABLET | Freq: Once | ORAL | Status: AC
  Administered 2017-04-03: 2 via ORAL
  Filled 2017-04-03: qty 2

## 2017-04-03 MED ORDER — AMIODARONE HCL 100 MG PO TABS
100.0000 mg | ORAL_TABLET | Freq: Every day | ORAL | Status: DC
  Administered 2017-04-03 – 2017-04-04 (×2): 100 mg via ORAL
  Filled 2017-04-03 (×2): qty 1

## 2017-04-03 NOTE — Progress Notes (Addendum)
Initial Nutrition Assessment  DOCUMENTATION CODES:   Severe malnutrition in context of chronic illness  INTERVENTION:  Glucerna Shake po TID, each supplement provides 220 kcal and 10 grams of protein  NUTRITION DIAGNOSIS:   Malnutrition (severe) related to chronic illness (CHF) as evidenced by severe depletion of muscle mass, severe depletion of body fat, energy intake < or equal to 75% for > or equal to 1 month, percent weight loss (13% in < 1 month).  GOAL:   Patient will meet greater than or equal to 90% of their needs  MONITOR:   PO intake, Weight trends, I & O's, Supplement acceptance  REASON FOR ASSESSMENT:   Malnutrition Screening Tool    ASSESSMENT:   Pt with PMH of HLD, HTN, CKD stage III, hypothyroidism, DM with peripheral neuropathy, and GERD presented 09/13 with difficulty breathing found acute respiratory failure with hypoxia secondary to acute CHF.    Pt s/p thoracentesis 9/25. Per MD note, since admission pt revealed severe mitral regurgitation, right-sided obstructive hydronephrosis, retroperitoneal hematoma, and Escherichia coli bacteremia.  Pt positive for polysubstance abuse. Pt reports current nausea and recent (reported as last night) vomiting and diarrhea.   Pt is net negative 26 L and 20 lbs down since admission. Believe some weight loss could be r/t thoracentesis and diuretics. Pt is off diuretics currently, Lasix d/c 10/02, pt continues to lose weight after this change. Therefore partial weight loss could be r/t pt's poor PO intake. No weight history available prior to current admission. Pt reports a UBW of 150 lbs.   Pt reports poor PO PTA. Pt reports slight increase in appetite since admission but dislikes the food taste and options so intake remains inadequate. Pt currently on Dysphagia 2 diet, intake variable from 10 to one meal of 100%. Pt's lunch tray at bedside at time of visit and it remains untouched.  Labs reviewed; CBG 100-172 Medications  reviewed; sliding scale insulin, Lantus, ferrous sulfate, Protonix, Senokot-S, vitamin C  Nutrition focused physical exam completed. Findings include moderate to severe fat depletion, moderate to severe muscle depletion and unable to assess edema.   Diet Order:  DIET DYS 2 Room service appropriate? Yes; Fluid consistency: Thin  Skin:  Reviewed, no issues  Last BM:  04/03/17  Height:   Ht Readings from Last 1 Encounters:  04/02/17 5\' 9"  (1.753 m)    Weight:   Wt Readings from Last 1 Encounters:  04/03/17 123 lb 12.8 oz (56.2 kg)    Ideal Body Weight:  66 kg  BMI:  Body mass index is 18.28 kg/m.  Estimated Nutritional Needs:   Kcal:  1500-1700  Protein:  85-100 grams  Fluid:  >/= 1.6 L/d  EDUCATION NEEDS:   Education needs no appropriate at this time  Parks Ranger, MS, RDN, LDN 04/03/2017 4:09 PM

## 2017-04-03 NOTE — Progress Notes (Signed)
Pt was also getting laxatives 2 xday on 9M and pt is on IV antibiotics, which may contribute to the frequent BM's,  will continue to monitor, Thanks Arvella Nigh RN.

## 2017-04-03 NOTE — Progress Notes (Signed)
PROGRESS NOTE        PATIENT DETAILS Name: Jocelyn Hill. Langdon Age: 66 y.o. Sex: female Date of Birth: 10/29/1950 Admit Date: 03/12/2017 Admitting Physician Annita Brod, MD YBW:LSLHT, Venia Carbon, MD  Brief Narrative: Patient is a 66 y.o. female with past medical history of hypertension, chronic kidney disease stage III, DM-2, hypothyroidism, admitted for acute hypoxic respiratory failure secondary to acute on chronic diastolic heart failure, acute kidney injury. Further workup revealed severe mitral regurgitation, right-sided obstructive hydronephrosis, retroperitoneal hematoma, and Escherichia coli bacteremia. See below for further details  Subjective: Lying comfortably in bed-no chest pain or shortness of breath.  Assessment/Plan: Acute hypoxic respiratory failure: Secondary to acute on chronic diastolic heart failure-much improved, slowly taper off oxygen.  Acute on chronic diastolic heart failure: Off diuretics, -26 L so far-weight decreased 223 pounds (143 pounds on admission). Volume status continues to be stable-continue to follow electrolytes and volume status closely.  Severe mitral regurgitation: Thought to be due to underlying rheumatic heart disease, seen by cardiothoracic surgery-currently not a candidate for valve replacement as patient is acutely ill with a lot of other medical issues, will need outpatient follow-up with cardiology and cardiothoracic surgery once she has recovered from acute illness.  Acute kidney injury on chronic kidney disease stage IV: Acute kidney injury thought to be due to obstructive nephropathy-creatinine slowly down trending-continue to follow closely.  Right sided obstructive uropathy: Initially underwent a right nephrostomy tube placement, subsequently with improvement of renal function-a repeat right nephrostogram was performed on 10/4-which showed a patent collecting system and ureter, nephrostomy tube has been  subsequently removed.  Retroperitoneal hematoma: Off anticoagulation-thought to have obstructive uropathy due to hematoma. Continue to follow CBC-hemoglobin currently stable.  Escherichia coli bacteremia and pyelonephritis: Afebrile-overall clinically improved-leukocytosis has also resolved-continue with Rocephin, we'll transition to oral antibiotics over the next few days  Paroxysmal atrial fibrillation: In a setting of rheumatic mitral valve disease, chads score of 8-no longer on anticoagulation due to retroperitoneal hematoma. Per cardiology note as of 10/4-to hold for approximately 2 weeks before resuming. Continue with amiodarone for now.  Acute metabolic encephalopathy: Seems to have resolved. Continue supportive care.  History of CAD: Elevated troponins-thought to be secondary to demand ischemia, no plans for cardiac cath currently per cardiology as renal function is prohibitive. Patient will follow with cardiology in the outpatient setting.  Dyslipidemia: Continue statin  DM-2: CBGs currently stable-continue Lantus and SSI-A1c 6.2.  Hypothyroidism: Continue Synthroid  Anemia: Likely secondary to acute illness-worsened by blood loss from retroperitoneal hematoma-hemoglobin stable, continue to monitor CBC periodically.  Morning labs/Imaging ordered: yes  DVT Prophylaxis: SCD's  Code Status: Full code   Family Communication: None at bedside  Disposition Plan: Remain inpatient-plan on SNF on discharge-in the next few days  Antimicrobial agents: Anti-infectives    Start     Dose/Rate Route Frequency Ordered Stop   03/27/17 1800  cefTRIAXone (ROCEPHIN) 2 g in dextrose 5 % 50 mL IVPB     2 g 100 mL/hr over 30 Minutes Intravenous Every 24 hours 03/27/17 1722     03/27/17 1500  ciprofloxacin (CIPRO) 400 MG/200ML IVPB    Comments:  Duininck, Stacey   : cabinet override      03/27/17 1500 03/27/17 1525   03/26/17 1200  piperacillin-tazobactam (ZOSYN) IVPB 2.25 g  Status:   Discontinued     2.25  g 100 mL/hr over 30 Minutes Intravenous Every 8 hours 03/26/17 1138 03/27/17 1722   03/26/17 1200  vancomycin (VANCOCIN) IVPB 750 mg/150 ml premix  Status:  Discontinued     750 mg 150 mL/hr over 60 Minutes Intravenous Every 48 hours 03/26/17 1138 03/27/17 0917   03/26/17 1145  piperacillin-tazobactam (ZOSYN) IVPB 3.375 g  Status:  Discontinued     3.375 g 100 mL/hr over 30 Minutes Intravenous  Once 03/26/17 1132 03/26/17 1138   03/26/17 1145  vancomycin (VANCOCIN) IVPB 1000 mg/200 mL premix  Status:  Discontinued     1,000 mg 200 mL/hr over 60 Minutes Intravenous  Once 03/26/17 1132 03/26/17 1138   03/16/17 1000  azithromycin (ZITHROMAX) tablet 250 mg  Status:  Discontinued     250 mg Oral Daily 03/16/17 0805 03/16/17 1316   03/14/17 0900  azithromycin (ZITHROMAX) 500 mg in dextrose 5 % 250 mL IVPB  Status:  Discontinued     500 mg 250 mL/hr over 60 Minutes Intravenous Every 24 hours 03/14/17 0802 03/16/17 0804      Procedures: 9/13 Echocardiogram:LVEF=60-65%. -Grade 2 diastolic dysfunction.-Severe MR.-Mildly dilated LA and RA.-, mild to mod TR,.PAP 60 mm Hg. 9/18 TEE: EF 55-60%.-Severe restriction anterior/posterior leaflet MV w/ severe regurgitation  9/25 RIGHT Thoracentesis; 0.35 L of pleural fluid 9/28 RIGHT percutaneous nephrostomy tube 10/4 right nephrostogram and removal of nephrostomy tube  CONSULTS:  cardiology, pulmonary/intensive care and dental surgery, CTVS  Time spent: 25 minutes-Greater than 50% of this time was spent in counseling, explanation of diagnosis, planning of further management, and coordination of care.  MEDICATIONS: Scheduled Meds: . amiodarone  100 mg Oral Daily  . aspirin  81 mg Oral Daily  . ferrous sulfate  325 mg Oral BID WC  . hydrALAZINE  75 mg Oral Q8H  . insulin aspart  0-15 Units Subcutaneous TID WC  . insulin glargine  4 Units Subcutaneous Daily  . levothyroxine  112 mcg Oral QAC breakfast  . mouth rinse  15 mL  Mouth Rinse BID  . pantoprazole  40 mg Oral Daily  . senna-docusate  1 tablet Oral BID  . sodium chloride flush  10-40 mL Intracatheter Q12H  . vitamin C  250 mg Oral BID   Continuous Infusions: . cefTRIAXone (ROCEPHIN)  IV Stopped (04/02/17 1747)   PRN Meds:.acetaminophen **OR** acetaminophen, hydrALAZINE, levalbuterol, LORazepam, ondansetron (ZOFRAN) IV, sodium chloride flush   PHYSICAL EXAM: Vital signs: Vitals:   04/03/17 0532 04/03/17 0700 04/03/17 0845 04/03/17 1200  BP: (!) 141/67  (!) 165/61 (!) 154/67  Pulse: (!) 106 (!) 51 (!) 51   Resp: 20  16 20   Temp: 98.3 F (36.8 C)  99 F (37.2 C) 98.4 F (36.9 C)  TempSrc: Oral  Oral Oral  SpO2: 100%  100% 100%  Weight:      Height:       Filed Weights   04/02/17 0339 04/02/17 1849 04/03/17 0200  Weight: 57.5 kg (126 lb 12.2 oz) 55.4 kg (122 lb 1.6 oz) 56.2 kg (123 lb 12.8 oz)   Body mass index is 18.28 kg/m.   General appearance :Awake, alert, not in any distress.  Eyes:, pupils equally reactive to light and accomodation,no scleral icterus. HEENT: Atraumatic and Normocephalic Neck: supple, no JVD. No cervical lymphadenopathy. Resp:Good air entry bilaterally  CVS: S1 S2 regular, + syst murmur.  GI: Bowel sounds present, Non tender and not distended with no gaurding, rigidity or rebound. Extremities: B/L Lower Ext shows no edema Neurology:  speech  clear,Non focal, sensation is grossly intact. Psychiatric: Normal judgment and insight. Alert and oriented x 3.  Musculoskeletal:No digital cyanosis Skin:No Rash, warm and dry Wounds:N/A  I have personally reviewed following labs and imaging studies  LABORATORY DATA: CBC:  Recent Labs Lab 03/29/17 1910 03/30/17 0616 03/31/17 0416 04/01/17 0359 04/02/17 0352  WBC 13.4* 10.2 10.8* 10.6* 10.3  HGB 7.8* 9.5* 8.9* 10.4* 9.7*  HCT 23.2* 28.2* 25.8* 30.8* 29.3*  MCV 78.1 78.3 78.7 79.6 80.1  PLT 149* 148* 194 246 149    Basic Metabolic Panel:  Recent Labs Lab  03/28/17 2330  03/30/17 0603 03/31/17 0416 04/01/17 0359 04/01/17 1500 04/02/17 0352 04/03/17 0401  NA 127*  < > 138 137 137  --  137 135  K 4.1  < > 3.7 2.7* 3.2* 4.1 4.0 3.7  CL 94*  < > 97* 99* 99*  --  105 102  CO2 21*  < > 24 24 24   --  20* 20*  GLUCOSE 147*  < > 115* 119* 120*  --  139* 149*  BUN 51*  < > 49* 43* 35*  --  34* 34*  CREATININE 5.00*  < > 4.36* 4.11* 3.62*  --  3.54* 3.72*  CALCIUM 8.2*  < > 8.9 8.5* 9.0  --  9.0 8.7*  MG 1.8  --   --   --  1.6*  --  2.1 2.0  PHOS  --   < > 4.1 3.3 3.5  --  3.0 4.1  < > = values in this interval not displayed.  GFR: Estimated Creatinine Clearance: 13.4 mL/min (A) (by C-G formula based on SCr of 3.72 mg/dL (H)).  Liver Function Tests:  Recent Labs Lab 03/28/17 2330  03/30/17 0603 03/31/17 0416 04/01/17 0359 04/02/17 0352 04/03/17 0401  AST 48*  --   --   --   --   --   --   ALT 90*  --   --   --   --   --   --   ALKPHOS 129*  --   --   --   --   --   --   BILITOT 1.4*  --   --   --   --   --   --   PROT 5.7*  --   --   --   --   --   --   ALBUMIN 2.7*  < > 2.6* 2.6* 2.9* 2.9* 2.9*  < > = values in this interval not displayed. No results for input(s): LIPASE, AMYLASE in the last 168 hours. No results for input(s): AMMONIA in the last 168 hours.  Coagulation Profile: No results for input(s): INR, PROTIME in the last 168 hours.  Cardiac Enzymes:  Recent Labs Lab 04/01/17 2200  TROPONINI 0.04*    BNP (last 3 results) No results for input(s): PROBNP in the last 8760 hours.  HbA1C: No results for input(s): HGBA1C in the last 72 hours.  CBG:  Recent Labs Lab 04/02/17 1246 04/02/17 1618 04/02/17 2105 04/03/17 0739 04/03/17 1205  GLUCAP 147* 216* 100* 138* 172*    Lipid Profile: No results for input(s): CHOL, HDL, LDLCALC, TRIG, CHOLHDL, LDLDIRECT in the last 72 hours.  Thyroid Function Tests: No results for input(s): TSH, T4TOTAL, FREET4, T3FREE, THYROIDAB in the last 72 hours.  Anemia  Panel: No results for input(s): VITAMINB12, FOLATE, FERRITIN, TIBC, IRON, RETICCTPCT in the last 72 hours.  Urine analysis:    Component Value Date/Time  COLORURINE YELLOW 03/26/2017 1539   APPEARANCEUR CLOUDY (A) 03/26/2017 1539   LABSPEC 1.010 03/26/2017 1539   PHURINE 6.5 03/26/2017 1539   GLUCOSEU NEGATIVE 03/26/2017 1539   HGBUR LARGE (A) 03/26/2017 1539   BILIRUBINUR NEGATIVE 03/26/2017 1539   KETONESUR NEGATIVE 03/26/2017 1539   PROTEINUR >300 (A) 03/26/2017 1539   NITRITE NEGATIVE 03/26/2017 1539   LEUKOCYTESUR LARGE (A) 03/26/2017 1539    Sepsis Labs: Lactic Acid, Venous    Component Value Date/Time   LATICACIDVEN 1.5 03/26/2017 1432    MICROBIOLOGY: Recent Results (from the past 240 hour(s))  Gram stain     Status: None   Collection Time: 03/24/17  4:23 PM  Result Value Ref Range Status   Specimen Description FLUID PLEURAL RIGHT  Final   Special Requests NONE  Final   Gram Stain   Final    WBC PRESENT, PREDOMINANTLY MONONUCLEAR NO ORGANISMS SEEN CYTOSPIN SMEAR    Report Status 03/24/2017 FINAL  Final  Fungus Culture With Stain     Status: None (Preliminary result)   Collection Time: 03/24/17  4:23 PM  Result Value Ref Range Status   Fungus Stain Final report  Final    Comment: (NOTE) Performed At: [facility] [place], [place] [id number] Lindon Romp MD [id number]    Fungus (Mycology) Culture PENDING  Incomplete   Fungal Source FLUID  Final    Comment: PLEURAL RIGHT   Acid Fast Smear (AFB)     Status: None   Collection Time: 03/24/17  4:23 PM  Result Value Ref Range Status   AFB Specimen Processing Concentration  Final   Acid Fast Smear Negative  Final    Comment: (NOTE) Performed At: [facility] [address] [place], [place] [id number] Lindon Romp MD [id number]    Source (AFB) FLUID  Final    Comment: PLEURAL RIGHT   Culture, body fluid-bottle     Status: None   Collection Time: 03/24/17   4:23 PM  Result Value Ref Range Status   Specimen Description FLUID PLEURAL RIGHT  Final   Special Requests BOTTLES DRAWN AEROBIC AND ANAEROBIC  Final   Culture NO GROWTH 5 DAYS  Final   Report Status 03/29/2017 FINAL  Final  Fungus Culture Result     Status: None   Collection Time: 03/24/17  4:23 PM  Result Value Ref Range Status   Result 1 Comment  Final    Comment: (NOTE) KOH/Calcofluor preparation:  no fungus observed. Performed At: [facility] [place], [place] [id number] Lindon Romp MD [id number]   Urine Culture     Status: Abnormal   Collection Time: 03/26/17  8:07 AM  Result Value Ref Range Status   Specimen Description URINE, CATHETERIZED  Final   Special Requests NONE  Final   Culture (A)  Final    >=100,000 COLONIES/mL KLEBSIELLA PNEUMONIAE >=100,000 COLONIES/mL ESCHERICHIA COLI    Report Status 03/29/2017 FINAL  Final   Organism ID, Bacteria KLEBSIELLA PNEUMONIAE (A)  Final   Organism ID, Bacteria ESCHERICHIA COLI (A)  Final      Susceptibility   Escherichia coli - MIC*    AMPICILLIN 4 SENSITIVE Sensitive     CEFAZOLIN <=4 SENSITIVE Sensitive     CEFTRIAXONE <=1 SENSITIVE Sensitive     CIPROFLOXACIN <=0.25 SENSITIVE Sensitive     GENTAMICIN <=1 SENSITIVE Sensitive     IMIPENEM <=0.25 SENSITIVE Sensitive     NITROFURANTOIN <=16 SENSITIVE Sensitive     TRIMETH/SULFA >=320  RESISTANT Resistant     AMPICILLIN/SULBACTAM <=2 SENSITIVE Sensitive     PIP/TAZO <=4 SENSITIVE Sensitive     Extended ESBL NEGATIVE Sensitive     * >=100,000 COLONIES/mL ESCHERICHIA COLI   Klebsiella pneumoniae - MIC*    AMPICILLIN >=32 RESISTANT Resistant     CEFAZOLIN <=4 SENSITIVE Sensitive     CEFTRIAXONE <=1 SENSITIVE Sensitive     CIPROFLOXACIN <=0.25 SENSITIVE Sensitive     GENTAMICIN <=1 SENSITIVE Sensitive     IMIPENEM <=0.25 SENSITIVE Sensitive     NITROFURANTOIN 64 INTERMEDIATE Intermediate     TRIMETH/SULFA <=20 SENSITIVE Sensitive      AMPICILLIN/SULBACTAM 4 SENSITIVE Sensitive     PIP/TAZO <=4 SENSITIVE Sensitive     Extended ESBL NEGATIVE Sensitive     * >=100,000 COLONIES/mL KLEBSIELLA PNEUMONIAE  Culture, blood (routine x 2)     Status: None   Collection Time: 03/26/17 10:40 AM  Result Value Ref Range Status   Specimen Description BLOOD LEFT HAND  Final   Special Requests IN PEDIATRIC BOTTLE Blood Culture adequate volume  Final   Culture NO GROWTH 5 DAYS  Final   Report Status 03/31/2017 FINAL  Final  Culture, blood (routine x 2)     Status: Abnormal   Collection Time: 03/26/17 10:40 AM  Result Value Ref Range Status   Specimen Description BLOOD RIGHT HAND  Final   Special Requests IN PEDIATRIC BOTTLE Blood Culture adequate volume  Final   Culture  Setup Time   Final    GRAM NEGATIVE RODS IN PEDIATRIC BOTTLE CRITICAL RESULT CALLED TO, READ BACK BY AND VERIFIED WITH: G.ABBOTT PHARMD 03/27/17 0548 L.CHAMPION    Culture ESCHERICHIA COLI (A)  Final   Report Status 03/29/2017 FINAL  Final   Organism ID, Bacteria ESCHERICHIA COLI  Final      Susceptibility   Escherichia coli - MIC*    AMPICILLIN 4 SENSITIVE Sensitive     CEFAZOLIN <=4 SENSITIVE Sensitive     CEFEPIME <=1 SENSITIVE Sensitive     CEFTAZIDIME <=1 SENSITIVE Sensitive     CEFTRIAXONE <=1 SENSITIVE Sensitive     CIPROFLOXACIN <=0.25 SENSITIVE Sensitive     GENTAMICIN <=1 SENSITIVE Sensitive     IMIPENEM <=0.25 SENSITIVE Sensitive     TRIMETH/SULFA >=320 RESISTANT Resistant     AMPICILLIN/SULBACTAM <=2 SENSITIVE Sensitive     PIP/TAZO <=4 SENSITIVE Sensitive     Extended ESBL NEGATIVE Sensitive     * ESCHERICHIA COLI  Blood Culture ID Panel (Reflexed)     Status: Abnormal   Collection Time: 03/26/17 10:40 AM  Result Value Ref Range Status   Enterococcus species NOT DETECTED NOT DETECTED Final   Listeria monocytogenes NOT DETECTED NOT DETECTED Final   Staphylococcus species NOT DETECTED NOT DETECTED Final   Staphylococcus aureus NOT DETECTED NOT  DETECTED Final   Streptococcus species NOT DETECTED NOT DETECTED Final   Streptococcus agalactiae NOT DETECTED NOT DETECTED Final   Streptococcus pneumoniae NOT DETECTED NOT DETECTED Final   Streptococcus pyogenes NOT DETECTED NOT DETECTED Final   Acinetobacter baumannii NOT DETECTED NOT DETECTED Final   Enterobacteriaceae species DETECTED (A) NOT DETECTED Final    Comment: Enterobacteriaceae represent a large family of gram-negative bacteria, not a single organism. CRITICAL RESULT CALLED TO, READ BACK BY AND VERIFIED WITH: G.ABBOTT PHARMD 03/27/17 0548 L.CHAMPION    Enterobacter cloacae complex NOT DETECTED NOT DETECTED Final   Escherichia coli DETECTED (A) NOT DETECTED Final    Comment: CRITICAL RESULT CALLED TO, READ BACK BY  AND VERIFIED WITH: G.ABBOTT PHARMD 03/27/17 0548 L.CHAMPION    Klebsiella oxytoca NOT DETECTED NOT DETECTED Final   Klebsiella pneumoniae NOT DETECTED NOT DETECTED Final   Proteus species NOT DETECTED NOT DETECTED Final   Serratia marcescens NOT DETECTED NOT DETECTED Final   Carbapenem resistance NOT DETECTED NOT DETECTED Final   Haemophilus influenzae NOT DETECTED NOT DETECTED Final   Neisseria meningitidis NOT DETECTED NOT DETECTED Final   Pseudomonas aeruginosa NOT DETECTED NOT DETECTED Final   Candida albicans NOT DETECTED NOT DETECTED Final   Candida glabrata NOT DETECTED NOT DETECTED Final   Candida krusei NOT DETECTED NOT DETECTED Final   Candida parapsilosis NOT DETECTED NOT DETECTED Final   Candida tropicalis NOT DETECTED NOT DETECTED Final  Culture, Urine     Status: Abnormal   Collection Time: 03/26/17  3:38 PM  Result Value Ref Range Status   Specimen Description URINE, RANDOM  Final   Special Requests NONE  Final   Culture MULTIPLE SPECIES PRESENT, SUGGEST RECOLLECTION (A)  Final   Report Status 03/27/2017 FINAL  Final  Culture, Urine     Status: None   Collection Time: 04/02/17  3:51 AM  Result Value Ref Range Status   Specimen Description  URINE, CATHETERIZED  Final   Special Requests NONE  Final   Culture NO GROWTH  Final   Report Status 04/03/2017 FINAL  Final    RADIOLOGY STUDIES/RESULTS: Ct Abdomen Pelvis Wo Contrast  Result Date: 03/26/2017 CLINICAL DATA:  Retroperitoneal hematoma. EXAM: CT ABDOMEN AND PELVIS WITHOUT CONTRAST TECHNIQUE: Multidetector CT imaging of the abdomen and pelvis was performed following the standard protocol without IV contrast. COMPARISON:  CT scan of March 24, 2017. FINDINGS: Lower chest: Mild left posterior basilar atelectasis or infiltrate is noted. Hepatobiliary: Dilated gallbladder is again noted and unchanged. No focal abnormality is seen in the liver on these unenhanced images. Pancreas: Unremarkable. No pancreatic ductal dilatation or surrounding inflammatory changes. Spleen: Normal in size without focal abnormality. Adrenals/Urinary Tract: Adrenal glands appear normal. Severe left renal atrophy is noted. Moderate right hydronephrosis with perinephric stranding is noted, which appears to be due to retroperitoneal hematoma described on prior exam. Foley catheter is present within the urinary bladder. No renal or ureteral calculi are noted. Stomach/Bowel: Stomach is within normal limits. Appendix appears normal. No evidence of bowel wall thickening, distention, or inflammatory changes. Vascular/Lymphatic: Aortic atherosclerosis. No enlarged abdominal or pelvic lymph nodes. Reproductive: Status post hysterectomy. No adnexal masses. Other: Stable right retroperitoneal hematoma is noted which extends along the right psoas muscle and into the right pelvis and slightly into the right inguinal region. Musculoskeletal: No acute or significant osseous findings. IMPRESSION: Stable right retroperitoneal hematoma is noted which extends along the right psoas muscle into the right pelvis and slightly in the right inguinal region. There is now noted moderate right hydronephrosis, most likely due to obstruction of  distal ureter secondary to hematoma. Severe left renal atrophy is noted. Aortic atherosclerosis. Dilated gallbladder is noted without definite surrounding inflammation. Electronically Signed   By: Marijo Conception, M.D.   On: 03/26/2017 15:40   Ct Abdomen Pelvis Wo Contrast  Result Date: 03/24/2017 CLINICAL DATA:  Abdominal pain.  Evaluate for appendicitis EXAM: CT ABDOMEN AND PELVIS WITHOUT CONTRAST TECHNIQUE: Multidetector CT imaging of the abdomen and pelvis was performed following the standard protocol without IV contrast. COMPARISON:  None. FINDINGS: Lower chest: There are bilateral pleural effusions noted left greater than right. The airspace densities within the left lower lobe and lingula  noted. There is compressive type atelectasis in the right base. Hepatobiliary: No focal liver abnormality. Gallbladder appears distended. No biliary dilatation. No gallstones identified. Pancreas: Within the limitations of unenhanced technique the pancreas appears unremarkable. Spleen: Normal in size without focal abnormality. Adrenals/Urinary Tract: The right kidney appears normal. Asymmetric atrophy of the left kidney. Normal adrenal glands. The urinary bladder is partially collapsed around a Foley catheter balloon. Stomach/Bowel: The stomach appears normal. The small bowel loops are normal in course and caliber. Normal appearance of the colon. There is a small caliber contrast filled structure within the right lower quadrant which is favored to represent a normal appendix, image 73 of series 3. No pathologic dilatation of the colon. Vascular/Lymphatic: Aortic atherosclerosis. No aneurysm. No adenopathy within the abdomen or pelvis. Reproductive: Uterus is not well visualized and may be atrophic or surgically absent. Other: There is asymmetric, high attenuation enlargement of the right ileo psoas muscle extending into the right pelvic sidewall and right inguinal region compatible with retroperitoneal and  extraperitoneal hematoma of the abdomen and pelvis. Hematocrit arrival is identified within the right psoas muscle hematoma, image number 74 of series 3. Hematoma including the psoas muscle measures 6.5 x 5.2 x 14.8 cm. Musculoskeletal: Mild degenerative disc disease identified within the lumbar spine. IMPRESSION: 1. A right retroperitoneal hematoma is suspected extending into the right side of pelvis and right groin region. A hematocrit level is identified within the asymmetrically enlarged right psoas muscle hematoma. 2. The appendix is normal. 3.  Aortic Atherosclerosis (ICD10-I70.0). 4. Gallbladder hydrops. Etiology and clinical significance indeterminate. This could be better assessed with right upper quadrant sonogram. 5. Bilateral pleural effusions with diminished aeration to the lung bases. 6. Critical Value/emergent results were called by telephone at the time of interpretation on 03/24/2017 at 5:59 pm to Dr. Dia Crawford , who verbally acknowledged these results. Electronically Signed   By: Kerby Moors M.D.   On: 03/24/2017 17:59   Dg Orthopantogram  Result Date: 03/23/2017 CLINICAL DATA:  Poor dentition.  Cardiac issues. EXAM: ORTHOPANTOGRAM/PANORAMIC COMPARISON:  None. FINDINGS: Mandible is intact. Numerous absent teeth. Multiple dental caries involving maxillary teeth, difficult to number due to overlapping incisors. Approximate tooth 3 periapical lucency/abscess. IMPRESSION: Poor dentition with multiple maxillary dental caries and, approximate tooth 3 periapical abscess. Electronically Signed   By: Elon Alas M.D.   On: 03/23/2017 19:56   Dg Chest 1 View  Result Date: 03/24/2017 CLINICAL DATA:  Post thoracentesis EXAM: CHEST 1 VIEW COMPARISON:  03/23/2017, 03/22/2017 FINDINGS: Right-sided catheter sheath remains in place with kink at the supraclavicular region. The tip projects over the venous confluence. Decreased right-sided pleural effusion with improved aeration of the right lung  base. Persistent small left effusion with dense consolidation at the left lung base. Hazy atelectasis or infiltrate at the right lung base. No pneumothorax is seen. Stable cardiomegaly. IMPRESSION: 1. Decreased right pleural effusion post thoracentesis. No definitive pneumothorax seen 2. Continued left pleural effusion with dense left lower lobe atelectasis or pneumonia. Hazy atelectasis or infiltrate at the right base 3. Cardiomegaly unchanged Electronically Signed   By: Donavan Foil M.D.   On: 03/24/2017 16:29   Ir Nephro Tube Remov/fl  Result Date: 04/02/2017 INDICATION: Recent right nephrostogram for right hydronephrosis secondary to right retroperitoneal hematoma EXAM: ANTEGRADE NEPHROSTOGRAM THROUGH EXISTING NEPHROSTOMY MEDICATIONS: NONE. ANESTHESIA/SEDATION: NONE. COMPLICATIONS: None immediate. PROCEDURE: Under sterile conditions, the existing right nephrostomy was injected with contrast for an antegrade nephrostogram. Right nephrostogram: Catheter has retracted. Extravasation noted at the retroperitoneal  access site. Contrast does opacify the collapsed collecting system without hydronephrosis. Contrast opacifies the ureter and eventually passes distally into the bladder without obstruction. There is deviation of the ureter medially related to the retroperitoneal hematoma. Nephrostomy catheter removed.  No immediate complication. IMPRESSION: Patent right collecting system and ureter without obstruction or hydronephrosis. Right nephrostomy removed. Electronically Signed   By: Jerilynn Mages.  Shick M.D.   On: 04/02/2017 12:36   Dg Chest Port 1 View  Result Date: 03/29/2017 CLINICAL DATA:  Pt has not had a BM since 9/21, c/o low back pain, recent nephrostomy tube placement, and recent retroperitoneal bleed. Pt has been groaning in pain for hours. EXAM: PORTABLE CHEST 1 VIEW COMPARISON:  03/26/2017 FINDINGS: Right IJV catheter is in place, crimped in the region of the neck. The heart is enlarged. There are hazy  airspace filling opacities bilaterally, compatible with edema or infectious process. No pleural effusions. IMPRESSION: Cardiomegaly.  Suspect increased edema or infection. Electronically Signed   By: Nolon Nations M.D.   On: 03/29/2017 07:20   Dg Chest Port 1 View  Result Date: 03/26/2017 CLINICAL DATA:  Pneumonia EXAM: PORTABLE CHEST 1 VIEW COMPARISON:  03/24/2017 FINDINGS: Cardiomegaly. Right lung is clear. Continued left lower lobe airspace opacity and probable small left effusion. Aeration is slightly improved in the left base. Right internal jugular vascular sheath again noted, unchanged. IMPRESSION: Continued left pleural effusion with left lower lobe atelectasis or infiltrate, both improved since prior study. No confluent opacity on the right. Stable cardiomegaly. Electronically Signed   By: Rolm Baptise M.D.   On: 03/26/2017 10:37   Dg Chest Port 1 View  Result Date: 03/23/2017 CLINICAL DATA:  Follow-up bilateral alveolar opacities. EXAM: PORTABLE CHEST 1 VIEW COMPARISON:  Portable chest x-ray of March 22, 2017 FINDINGS: The lungs are adequately inflated. Bibasilar parenchymal consolidation persists. The interstitial markings elsewhere are mildly prominent. The cardiac silhouette is enlarged. The pulmonary vascularity is engorged. There is calcification in the wall of the aortic arch. The right internal jugular Cordis sheath tip projects over the proximal SVC. There is a kink in this catheter as it enters the scan. IMPRESSION: Bibasilar pneumonia. Small bilateral pleural effusions. Parenchymal consolidation at the right base has increased since yesterday's study. Mild CHF. Thoracic aortic atherosclerosis. Electronically Signed   By: David  Martinique M.D.   On: 03/23/2017 07:37   Dg Chest Port 1 View  Result Date: 03/22/2017 CLINICAL DATA:  Increasing shortness of breath EXAM: PORTABLE CHEST 1 VIEW COMPARISON:  March 21, 2017 FINDINGS: No pneumothorax. Interstitial opacities throughout the  left lung are stable. Effusion and opacity in the right base is mildly improved. Focal infiltrate in the left retrocardiac region is unchanged. No other interval changes. IMPRESSION: Persistent pulmonary opacities as above.  Suggested small effusions. Electronically Signed   By: Dorise Bullion III M.D   On: 03/22/2017 10:22   Dg Chest Port 1 View  Result Date: 03/21/2017 CLINICAL DATA:  Shortness of breath. Concern for pulmonary edema. History of diabetes and hypertension. EXAM: PORTABLE CHEST 1 VIEW COMPARISON:  Chest x-rays dated 03/18/2017 and 03/17/2017. FINDINGS: Increasing central pulmonary vascular congestion and perihilar interstitial edema. Bibasilar opacities, some component likely related to associated pulmonary edema. Suspect additional atelectasis and/or small pleural effusions at each lung base. No pneumothorax seen. Stable cardiomegaly. Atherosclerotic changes noted at the aortic arch. Endotracheal tube and enteric tube have been removed. IMPRESSION: 1. Increasing central pulmonary vascular congestion and perihilar edema indicating CHF/volume overload. 2. Bibasilar opacities are not significantly  changed, most likely a combination of atelectasis and small pleural effusions. Suspect some degree of superimposed pulmonary edema. Additional underlying pneumonia cannot be excluded if febrile. 3. Stable cardiomegaly. 4. Aortic atherosclerosis. Electronically Signed   By: Franki Cabot M.D.   On: 03/21/2017 09:20   Dg Chest Port 1 View  Result Date: 03/18/2017 CLINICAL DATA:  Respiratory failure EXAM: PORTABLE CHEST 1 VIEW COMPARISON:  03/17/2017 FINDINGS: Endotracheal tube and NG tube are unchanged. Cardiomegaly with vascular congestion. Layering right effusion with right lower lobe atelectasis or infiltrate and left retrocardiac opacity, unchanged. Possible small left effusion. IMPRESSION: Continued bilateral effusions, right greater the left with bibasilar atelectasis or infiltrates.  Cardiomegaly, vascular congestion. Electronically Signed   By: Rolm Baptise M.D.   On: 03/18/2017 08:52   Portable Chest Xray  Result Date: 03/17/2017 CLINICAL DATA:  Intubated, respiratory failure EXAM: PORTABLE CHEST 1 VIEW COMPARISON:  Chest radiograph from one day prior. FINDINGS: Endotracheal tube tip is 5.4 cm above the carina. Enteric tube enters stomach with the tip not seen on this image. Right internal jugular central venous sheath terminates over the right upper mediastinum. Stable cardiomediastinal silhouette with mild cardiomegaly and aortic atherosclerosis. No pneumothorax. Small stable bilateral pleural effusions, right greater than left. Mild pulmonary edema, decreased. Bibasilar lung opacities are stable. IMPRESSION: 1. Well-positioned support structures.  No pneumothorax. 2. Mild congestive heart failure, improved. 3. Stable small bilateral pleural effusions, right greater than left with bibasilar lung opacities, favor atelectasis. Electronically Signed   By: Ilona Sorrel M.D.   On: 03/17/2017 07:55   Dg Chest Port 1 View  Result Date: 03/16/2017 CLINICAL DATA:  Central venous line placement. EXAM: PORTABLE CHEST 1 VIEW COMPARISON:  03/16/2017 at 1304 hours FINDINGS: New right internal jugular central venous introducer sheath has its tip projecting upper superior vena cava. No pneumothorax. Endotracheal tube and nasal/ orogastric tube are unchanged from prior exam, satisfactorily positioned. No change in the cardiomegaly or bilateral lung base opacities/pleural effusions. IMPRESSION: 1. New right internal jugular introducer sheath with its tip in the upper superior vena cava. No pneumothorax. No other change from the earlier study. Electronically Signed   By: Lajean Manes M.D.   On: 03/16/2017 15:16   Portable Chest Xray  Result Date: 03/16/2017 CLINICAL DATA:  66 year old female who coded today status post CPR EXAM: PORTABLE CHEST 1 VIEW COMPARISON:  0759 hours today and earlier.  FINDINGS: Portable AP semi upright view at at 1304 hours. Intubated. Endotracheal tube tip in good position between the level the clavicles and carina. Enteric tube placed and courses to the abdomen. Side hole is at the level of the gastric body. Left and midline chest pacer or resuscitation pads in place. Stable cardiomegaly and mediastinal contours. Continued veiling opacity at both lung bases. No pneumothorax. Stable pulmonary vascularity, no acute edema. No areas of worsening ventilation. No displaced rib fracture or acute osseous abnormality identified. IMPRESSION: 1. Endotracheal tube tip in good position. Enteric tube courses to the abdomen, side hole at the level of the stomach. 2. Stable ventilation from earlier today; bilateral pleural effusions with lower lobe collapse or consolidation. 3. Stable cardiomegaly and mediastinal contours. Electronically Signed   By: Genevie Ann M.D.   On: 03/16/2017 13:27   Dg Chest Port 1 View  Result Date: 03/16/2017 CLINICAL DATA:  Increased SOB, CP. Abdominal pain and constipation. No BM in 48 hours. Hx of DM, HTN, stroke. Smoker. EXAM: PORTABLE CHEST 1 VIEW COMPARISON:  03/13/2017 FINDINGS: Stable cardiomegaly. Stable bilateral  pleural effusions and bibasilar atelectasis. Stable pulmonary interstitial prominence, consistent with mild interstitial edema. No pneumothorax visualized. IMPRESSION: No significant change in cardiomegaly, bilateral pleural effusions, and probable mild pulmonary interstitial edema Electronically Signed   By: Earle Gell M.D.   On: 03/16/2017 08:16   Dg Chest Port 1 View  Result Date: 03/13/2017 CLINICAL DATA:  CHF. EXAM: PORTABLE CHEST 1 VIEW COMPARISON:  03/12/2017. FINDINGS: Cardiomegaly with bilateral from interstitial prominence and bilateral pleural effusions consistent CHF. No change prior exam. No evidence of pneumothorax. No acute bony abnormality. IMPRESSION: Congestive heart failure with pulmonary interstitial edema and bilateral  pleural effusions. No change from prior exam. Electronically Signed   By: Marcello Moores  Register   On: 03/13/2017 06:23   Dg Abd Portable 1v  Result Date: 03/29/2017 CLINICAL DATA:  Pt has not had a BM since 9/21, c/o low back pain, recent nephrostomy tube placement, and recent retroperitoneal bleed. Pt has been groaning in pain for hours. EXAM: PORTABLE ABDOMEN - 1 VIEW COMPARISON:  03/17/2017 FINDINGS: Right-sided percutaneous nephrostomy has been placed since the prior study. There is contrast within nondilated loops of colon. Average stool burden. No evidence for free intraperitoneal air or bowel obstruction. Scattered phleboliths are identified within the pelvis. IMPRESSION: Nonobstructive bowel gas pattern. Retained contrast, presumably follow up CT of the abdomen and pelvis on 03/26/2017. Electronically Signed   By: Nolon Nations M.D.   On: 03/29/2017 07:18   Dg Abd Portable 1v  Result Date: 03/17/2017 CLINICAL DATA:  Orogastric tube placement EXAM: PORTABLE ABDOMEN - 1 VIEW COMPARISON:  March 16, 2017 FINDINGS: Orogastric tube tip and side port in stomach. There is a paucity of bowel gas. There is no bowel dilatation or air-fluid level to suggest obstruction. No free air. There are pleural effusions bilaterally with bibasilar consolidation. There is cardiomegaly. IMPRESSION: Orogastric tube tip and side port in stomach. Paucity of bowel gas may be seen normally but may also be indicative of enteritis or early ileus. Bowel obstruction not felt to be likely. No free air. Cardiomegaly. Question alveolar edema versus pneumonia in the bases. Electronically Signed   By: Lowella Grip III M.D.   On: 03/17/2017 11:02   Dg Abd Portable 1v  Result Date: 03/16/2017 CLINICAL DATA:  Abdominal pain.  Constipation. EXAM: PORTABLE ABDOMEN - 1 VIEW COMPARISON:  None. FINDINGS: The bowel gas pattern is normal with minimal air scattered throughout bowel. No excessive stool in the colon. No fecal impaction.  Extensive calcification in the abdominal aorta and iliac arteries. No significant bone abnormality. IMPRESSION: Benign-appearing abdomen.  No excessive stool in the colon. Aortic atherosclerosis. Electronically Signed   By: Lorriane Shire M.D.   On: 03/16/2017 08:17   Ir Nephrostogram Right Thru Existing Access  Result Date: 04/02/2017 INDICATION: Recent right nephrostogram for right hydronephrosis secondary to right retroperitoneal hematoma EXAM: ANTEGRADE NEPHROSTOGRAM THROUGH EXISTING NEPHROSTOMY MEDICATIONS: NONE. ANESTHESIA/SEDATION: NONE. COMPLICATIONS: None immediate. PROCEDURE: Under sterile conditions, the existing right nephrostomy was injected with contrast for an antegrade nephrostogram. Right nephrostogram: Catheter has retracted. Extravasation noted at the retroperitoneal access site. Contrast does opacify the collapsed collecting system without hydronephrosis. Contrast opacifies the ureter and eventually passes distally into the bladder without obstruction. There is deviation of the ureter medially related to the retroperitoneal hematoma. Nephrostomy catheter removed.  No immediate complication. IMPRESSION: Patent right collecting system and ureter without obstruction or hydronephrosis. Right nephrostomy removed. Electronically Signed   By: Jerilynn Mages.  Shick M.D.   On: 04/02/2017 12:36   Ir Nephrostomy  Placement Right  Result Date: 03/27/2017 INDICATION: Right-sided hydronephrosis secondary to ureteral obstruction. EXAM: IR NEPHROSTOMY PLACEMENT RIGHT COMPARISON:  CT of the abdomen on 03/26/2017 MEDICATIONS: Ciprofloxacin 400 mg IV; The antibiotic was administered in an appropriate time frame prior to skin puncture. ANESTHESIA/SEDATION: Fentanyl 100 mcg IV; Versed 4.0 mg IV Moderate Sedation Time:  10 minutes. The patient was continuously monitored during the procedure by the interventional radiology nurse under my direct supervision. CONTRAST:  10 mL Isovue-300 - administered into the collecting  system(s) FLUOROSCOPY TIME:  Fluoroscopy Time: 1 minutes and 30 seconds (4.1 mGy). COMPLICATIONS: None immediate. PROCEDURE: Informed written consent was obtained from the patient after a thorough discussion of the procedural risks, benefits and alternatives. All questions were addressed. Maximal Sterile Barrier Technique was utilized including caps, mask, sterile gowns, sterile gloves, sterile drape, hand hygiene and skin antiseptic. A timeout was performed prior to the initiation of the procedure. Ultrasound was used to localize the right kidney. Under direct ultrasound guidance, a 21 gauge needle was advanced into the collecting system. After aspiration of urine, contrast was injected. A guidewire was advanced into the collecting system. A transitional dilator was placed over the wire. The percutaneous tract was dilated over the wire and a 10 French percutaneous nephrostomy tube advanced. This was formed in the collecting system and a fluoroscopic spot image obtained after injection of contrast material. The catheter was flushed and connected to a gravity drainage bag. It was secured at the skin with a Prolene retention suture and StatLock device. FINDINGS: By ultrasound and also later with contrast injection, hydronephrosis of the right kidney does appear to be improved. However, given renal failure progressive hyperkalemia, there was felt to be indication to proceed with nephrostomy tube placement. The tube was placed via interpolar access with the catheter formed in the renal pelvis. IMPRESSION: Right-sided percutaneous nephrostomy tube placement with placement of a 10 French catheter formed at the level of the renal pelvis. The catheter was attached to gravity bag drainage. Electronically Signed   By: Aletta Edouard M.D.   On: 03/27/2017 16:21   Ir Thoracentesis Asp Pleural Space W/img Guide  Result Date: 03/24/2017 INDICATION: History of right-sided pleural effusion noted on x-ray. Request is made for  diagnostic and therapeutic thoracentesis. EXAM: ULTRASOUND GUIDED DIAGNOSTIC AND THERAPEUTIC THORACENTESIS MEDICATIONS: 1% lidocaine COMPLICATIONS: None immediate. PROCEDURE: An ultrasound guided thoracentesis was thoroughly discussed with the patient and questions answered. The benefits, risks, alternatives and complications were also discussed. The patient understands and wishes to proceed with the procedure. Written consent was obtained. Ultrasound was performed to localize and mark an adequate pocket of fluid in the right chest. The area was then prepped and draped in the normal sterile fashion. 1% Lidocaine was used for local anesthesia. Under ultrasound guidance a Safe-T-Centesis catheter was introduced. Thoracentesis was performed. The catheter was removed and a dressing applied. FINDINGS: A total of approximately 0.35 L of serous fluid was removed. Samples were sent to the laboratory as requested by the clinical team. IMPRESSION: Successful ultrasound guided right thoracentesis yielding 0.35 L of pleural fluid. Read by: Saverio Danker, PA-C Electronically Signed   By: Aletta Edouard M.D.   On: 03/24/2017 16:38     LOS: 22 days   Oren Binet, MD  [facility] Pager:[phone]  If 7PM-7AM, please contact night-coverage [email or web address] Password [email or web address] 04/03/2017, 12:36 PM

## 2017-04-03 NOTE — Progress Notes (Signed)
Pt is having frequent BM's btn type 6-7, but no fever, WBC's are WNL, may need to be ruled out for c-diff, thanks [facility] [place].

## 2017-04-03 NOTE — Progress Notes (Signed)
04/03/2017  Patient:            Jocelyn Hill. [place]itto Date of Birth:  03/25/1951 MRN:                [id number]   BP (!) 154/67 (BP Location: Left Arm)   Pulse (!) 51   Temp 98.4 F (36.9 C) (Oral)   Resp 20   Ht 5\' 9"  (1.753 m)   Wt 123 lb 12.8 oz (56.2 kg)   SpO2 100%   BMI 18.28 kg/m    I performed a chart review and had conversations with Dr. Roxy Manns and Dr. Aundra Dubin. Events of last few weeks noted and discussed plan of care and dental findings w/ Dr Aundra Dubin. Patient is not currently a candidate for surgical treatment of severe rheumatic mitral regurgitation by Dr. Roxy Manns. Patient currently denies acute pulpitis symptoms but will need multiple dental extractions prior to future heart valve surgery.  Plan:    Will defer dental extractions in the operating room with general anesthesia at this time.  Dr. Aundra Dubin to [place] when patient is more medically stable and is able to undergo dental procedures.   [name], DDS

## 2017-04-03 NOTE — Clinical Social Work Note (Signed)
No bed offers from [facility] yet. Referrals still pending for [place], [place]. CSW sent therapy notes for them to review.  [name], [place]

## 2017-04-04 DIAGNOSIS — D631 Anemia in chronic kidney disease: Secondary | ICD-10-CM

## 2017-04-04 DIAGNOSIS — I5043 Acute on chronic combined systolic (congestive) and diastolic (congestive) heart failure: Secondary | ICD-10-CM

## 2017-04-04 DIAGNOSIS — G9340 Encephalopathy, unspecified: Secondary | ICD-10-CM

## 2017-04-04 DIAGNOSIS — E119 Type 2 diabetes mellitus without complications: Secondary | ICD-10-CM

## 2017-04-04 LAB — RENAL FUNCTION PANEL
Albumin: 3.1 g/dL — ABNORMAL LOW (ref 3.5–5.0)
Anion gap: 12 (ref 5–15)
BUN: 29 mg/dL — ABNORMAL HIGH (ref 6–20)
CO2: 20 mmol/L — ABNORMAL LOW (ref 22–32)
Calcium: 9 mg/dL (ref 8.9–10.3)
Chloride: 106 mmol/L (ref 101–111)
Creatinine, Ser: 3.18 mg/dL — ABNORMAL HIGH (ref 0.44–1.00)
GFR calc Af Amer: 17 mL/min — ABNORMAL LOW (ref >60)
GFR calc non Af Amer: 14 mL/min — ABNORMAL LOW (ref >60)
Glucose, Bld: 157 mg/dL — ABNORMAL HIGH (ref 65–99)
Phosphorus: 4.2 mg/dL (ref 2.5–4.6)
Potassium: 3.9 mmol/L (ref 3.5–5.1)
Sodium: 138 mmol/L (ref 135–145)

## 2017-04-04 LAB — GLUCOSE, CAPILLARY
Glucose-Capillary: 169 mg/dL — ABNORMAL HIGH (ref 65–99)
Glucose-Capillary: 121 mg/dL — ABNORMAL HIGH (ref 65–99)

## 2017-04-04 LAB — MAGNESIUM: Magnesium: 2 mg/dL (ref 1.7–2.4)

## 2017-04-04 MED ORDER — INSULIN GLARGINE 100 UNIT/ML ~~LOC~~ SOLN: 4.0000 [IU] | Freq: Every day | SUBCUTANEOUS | 11 refills | Status: DC

## 2017-04-04 MED ORDER — AMIODARONE HCL 100 MG PO TABS: 200.0000 mg | ORAL_TABLET | Freq: Every day | ORAL | Status: DC

## 2017-04-04 MED ORDER — AMOXICILLIN-POT CLAVULANATE 875-125 MG PO TABS: 1.0000 | ORAL_TABLET | Freq: Two times a day (BID) | ORAL | Status: DC

## 2017-04-04 MED ORDER — HYDRALAZINE HCL 25 MG PO TABS: 75.0000 mg | ORAL_TABLET | Freq: Three times a day (TID) | ORAL | Status: DC

## 2017-04-04 MED ORDER — OXYCODONE HCL 10 MG PO TABS: 5.0000 mg | ORAL_TABLET | Freq: Three times a day (TID) | ORAL | 0 refills | Status: DC | PRN

## 2017-04-04 MED ORDER — FERROUS SULFATE 325 (65 FE) MG PO TABS: 325.0000 mg | ORAL_TABLET | Freq: Two times a day (BID) | ORAL | 3 refills | Status: DC

## 2017-04-04 NOTE — Progress Notes (Signed)
Clinical Social Worker facilitated patient discharge including contacting patient family and facility to confirm patient discharge plans.  Clinical information faxed to facility and family agreeable with plan.  CSW arranged ambulance transport via [facility] to [facility] .  RN to call [phone] for report prior to discharge.  Clinical Social Worker will sign off for now as social work intervention is no longer needed. Please consult [place] again if new need arises.  Rhea Pink, MSW, [place]

## 2017-04-04 NOTE — Discharge Summary (Signed)
Physician Discharge Summary  Jocelyn Hill. Matheson WVP:[id number] DOB: 1950/08/06 DOA: 03/12/2017  PCP: [name], MD  Admit date: 03/12/2017 Discharge date: 04/04/2017  Admitted From: home Disposition:  SNF  Recommendations for Outpatient Follow-up:  1. Follow up with Dr. Aundra Dubin with heart failure in 1-2 weeks 2. Follow up with Dr. Roxy Manns in 1-2 months 3. Follow up with Dr. Enrique Sack in 1-2 weeks 4. Continue Augmentin for 1 additional week   Discharge Condition: stable CODE STATUS: Full code Diet recommendation: low salt, heart healthy  HPI: Per Dr. Maryland Pink, Jocelyn Hill. Ailey is a 66 y.o. female with medical history significant for poorly controlled hypertension with stage III chronic kidney disease plus diabetes mellitus with peripheral neuropathy and hypothyroidism who states that over the past month, she is noted she has become more winded. Then starting on Saturday, 9/8, patient cut the point where she felt like she is having a lot of trouble breathing. The symptoms continued to progress to the point where she could not take it anymore and she came in to the emergency room at [facility] for further evaluation. Patient found to have a BNP of 1700 with a new diagnosis of CHF. Chest x-ray noted moderate bilateral pleural effusions. Patient was given Lasix and put on nitroglycerin patch. Blood pressure systolic in the [id number]. Patient initially BiPAP to keep oxygen saturations greater than 90%. In addition, creatinine noted to be 3.2 or baseline was 2.1. Because of worsening creatinine and decompensated CHF, it was felt best to transfer this patient to [facility]. Hospitalists accepted transfer.  Hospital Course: Discharge Diagnoses:  Principal Problem:   Acute on chronic respiratory failure with hypoxia (HCC) Active Problems:   Hypertension   Hypothyroidism   Diet-controlled diabetes mellitus (HCC)   Acute diastolic (congestive) heart failure (HCC)   AKI (acute kidney  injury) ([place])   CKD (chronic kidney disease), stage III (HCC)   CHF (congestive heart failure) (HCC)   Dysphagia   Malignant hypertension   Microcytic anemia   Severe mitral regurgitation   CKD (chronic kidney disease), stage IV (HCC)   Substance abuse (HCC)   Acute diastolic CHF (congestive heart failure) (HCC)   Pulmonary hypertension (HCC)   Asystole (HCC)   Non-rheumatic mitral regurgitation   Acute renal failure superimposed on stage 3 chronic kidney disease (HCC)   Anemia due to chronic kidney disease   Controlled diabetes mellitus type 2 with complications ([place])   Acute encephalopathy   Acute hypoxic respiratory failure: Secondary to acute on chronic diastolic heart failure-much improved, slowly taper off oxygen as tolerated Acute on chronic diastolic heart failure:  Patient was initially diuresed aggressively, she is net -26.6 L, her weight has decreased from 143 pounds on admission to 123.  She appears euvolemic on discharge, her volume status continues to be stable, because of renal failure her diuretics are being held, and she is making great urine.  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  She was also evaluated by dentistry given dental clearance prior to mitral surgery, and no acute interventions were indicated and will need dental work when she improves from her acute  illness Acute kidney injury on chronic kidney disease stage IV: Acute kidney injury thought to be due to obstructive nephropathy, was as high as 5.0.  During this hospitalization and has  slowly improving, and it is 3.18 on the day of discharge.  Nephrology was consulted and have follow patient while hospitalized.  She will be maintained off of diuretics for now.  Stop potassium supplementation on discharge. Right sided obstructive uropathy:  This was thought to be due to retroperitoneal hematoma, patient initially underwent a right nephrostomy tube placement, subsequently with improvement of renal function-a repeat right nephrostogram was performed on 10/4-which showed a patent collecting system and ureter, nephrostomy tube has been subsequently removed.  She is able to urinate without further difficulties Retroperitoneal hematoma: Off anticoagulation-thought to have obstructive uropathy due to hematoma. Hemoglobin currently stable. Escherichia coli bacteremia and pyelonephritis: Afebrile-overall clinically improved-leukocytosis has also resolved-continue with Rocephin, her surveillance cultures have remained negative, based on antibiogram will narrow to Augmentin and she is to continue for 7 additional days to complete a 2-week course Paroxysmal atrial fibrillation: In a setting of rheumatic mitral valve disease, chads score of 8-no longer on anticoagulation due to retroperitoneal hematoma. Per cardiology note as of 10/4-to hold for approximately 2 weeks before resuming. Continue with amiodarone for now. Acute metabolic encephalopathy: Seems to have resolved. Continue supportive care. History of CAD: Elevated troponins-thought to be secondary to demand ischemia, no plans for cardiac cath currently per cardiology as renal function is prohibitive. Patient will follow with cardiology in the outpatient setting. Dyslipidemia: Continue statin DM-2: CBGs currently stable- continue Lantus and SSI-A1c 6.2. Hypothyroidism: Continue Synthroid Anemia: Likely secondary to acute illness-worsened by blood loss from retroperitoneal hematoma-hemoglobin stable, continue to monitor CBC  periodically. Klebsiella UTI - Augmentin as above will cover   Discharge Instructions   Allergies as of 04/04/2017      Reactions   Ace Inhibitors Anaphylaxis, Swelling      Medication List    STOP taking these medications   HM POTASSIUM 595 (99 K) MG Tabs tablet Generic drug:  potassium gluconate   labetalol 200 MG tablet Commonly known as:  NORMODYNE   minoxidil 2.5 MG tablet Commonly known as:  LONITEN   zolpidem 10 MG tablet Commonly known as:  AMBIEN     TAKE these medications   albuterol 108 (90 Base) MCG/ACT inhaler Commonly known as:  PROVENTIL HFA;VENTOLIN HFA Inhale 2 puffs into the lungs every 4 (four) hours as needed for wheezing or shortness of breath.   amiodarone 100 MG tablet Commonly known as:  PACERONE Take 2 tablets (200 mg total) by mouth daily.   amLODipine 10 MG tablet Commonly known as:  NORVASC Take 10 mg by mouth daily.   amoxicillin-clavulanate 875-125 MG tablet Commonly known as:  AUGMENTIN Take 1 tablet by mouth 2 (two) times daily. For 7 more days   aspirin EC 81 MG tablet Take 81 mg by mouth daily.   atorvastatin 40 MG tablet Commonly known as:  LIPITOR Take 40 mg by mouth daily.   b complex vitamins capsule Take 1 capsule by mouth daily.   buPROPion 300 MG 24 hr tablet Commonly known as:  WELLBUTRIN XL Take 300 mg by mouth daily.   busPIRone 15 MG tablet Commonly known as:  BUSPAR Take 15 mg by mouth 2 (two) times daily.   cholecalciferol 1000 units tablet Commonly known as:  VITAMIN D Take 1,000 Units by mouth daily.   DULoxetine 30 MG capsule Commonly known as:  CYMBALTA Take 30 mg by mouth daily.   ferrous sulfate 325 (65 FE) MG tablet Take 1 tablet (325 mg total) by mouth 2 (two) times daily with a meal.   gabapentin 300 MG capsule Commonly known as:  NEURONTIN Take 300 mg by mouth at bedtime.   hydrALAZINE 25 MG tablet Commonly known as:  APRESOLINE Take 3 tablets (75 mg total) by mouth every 8 (eight)  hours. What changed:  medication strength  how much to take  when to take this   insulin glargine 100 UNIT/ML injection Commonly known as:  LANTUS Inject 0.04 mLs (4 Units total) into the skin daily.   levothyroxine 112 MCG tablet Commonly known as:  SYNTHROID, LEVOTHROID Take 112 mcg by mouth daily before breakfast.   montelukast 10 MG tablet Commonly known as:  SINGULAIR Take 10 mg by mouth daily.   Oxycodone HCl 10 MG Tabs Take 0.5 tablets (5 mg total) by mouth 3 (three) times daily as needed (pain). What changed:  how much to take   pantoprazole 40 MG tablet Commonly known as:  PROTONIX Take 40 mg by mouth daily.   pregabalin 150 MG capsule Commonly known as:  LYRICA Take 150 mg by mouth daily.   tiZANidine 4 MG capsule Commonly known as:  ZANAFLEX Take 4 mg by mouth 3 (three) times daily.      Consultations:  Cardiology-heart failure  Thoracic surgery  Dentistry  Procedures/Studies:  2D echo  Study Conclusions - Left ventricle: The cavity size was normal. Wall thickness was normal. Systolic function was normal. The estimated ejection fraction was in the range of 60% to 65%. Wall motion was normal; there were no regional wall motion abnormalities. Features are consistent with a pseudonormal left ventricular filling pattern, with concomitant abnormal relaxation and increased filling pressure (grade 2 diastolic dysfunction). - Mitral valve: There was severe regurgitation. - Left atrium: The atrium was severely dilated. - Right atrium: The atrium was mildly dilated. - Tricuspid valve: There was mild-moderate regurgitation. - Pulmonary arteries: Systolic pressure was moderately increased. PA peak pressure: 60 mm Hg (S). - Pericardium, extracardiac: A small to moderate pericardial effusion was identified. There was no evidence of hemodynamic compromise.  TEE  Impressions: - Normal LV systolic function; mild LAE; restricted anterior and posterior MV  leaflets with severe MR; mild to moderate TR.  Ct Abdomen Pelvis Wo Contrast  Result Date: 03/26/2017 CLINICAL DATA:  Retroperitoneal hematoma. EXAM: CT ABDOMEN AND PELVIS WITHOUT CONTRAST TECHNIQUE: Multidetector CT imaging of the abdomen and pelvis was performed following the standard protocol without IV contrast. COMPARISON:  CT scan of March 24, 2017. FINDINGS: Lower chest: Mild left posterior basilar atelectasis or infiltrate is noted. Hepatobiliary: Dilated gallbladder is again noted and unchanged. No focal abnormality is seen in the liver on these unenhanced images. Pancreas: Unremarkable. No pancreatic ductal dilatation or surrounding inflammatory changes. Spleen: Normal in size without focal abnormality. Adrenals/Urinary Tract: Adrenal glands appear normal. Severe left renal atrophy is noted. Moderate right hydronephrosis with perinephric stranding is noted, which appears to be due to retroperitoneal hematoma described on prior exam. Foley catheter is present within the urinary bladder. No renal or ureteral calculi are noted. Stomach/Bowel: Stomach is within normal limits. Appendix appears normal. No evidence of bowel wall thickening, distention, or inflammatory changes. Vascular/Lymphatic: Aortic atherosclerosis. No enlarged abdominal or pelvic lymph nodes. Reproductive: Status post hysterectomy. No adnexal masses. Other: Stable right retroperitoneal hematoma is noted which extends along the right psoas muscle and into the right pelvis  and slightly into the right inguinal region. Musculoskeletal: No acute or significant osseous findings. IMPRESSION: Stable right retroperitoneal hematoma is noted which extends along the right psoas muscle into the right pelvis and slightly in the right inguinal region. There is now noted moderate right hydronephrosis, most likely due to obstruction of distal ureter secondary to hematoma. Severe left renal atrophy is noted. Aortic atherosclerosis. Dilated gallbladder  is noted without definite surrounding inflammation. Electronically Signed   By: Marijo Conception, M.D.   On: 03/26/2017 15:40   Ct Abdomen Pelvis Wo Contrast  Result Date: 03/24/2017 CLINICAL DATA:  Abdominal pain.  Evaluate for appendicitis EXAM: CT ABDOMEN AND PELVIS WITHOUT CONTRAST TECHNIQUE: Multidetector CT imaging of the abdomen and pelvis was performed following the standard protocol without IV contrast. COMPARISON:  None. FINDINGS: Lower chest: There are bilateral pleural effusions noted left greater than right. The airspace densities within the left lower lobe and lingula noted. There is compressive type atelectasis in the right base. Hepatobiliary: No focal liver abnormality. Gallbladder appears distended. No biliary dilatation. No gallstones identified. Pancreas: Within the limitations of unenhanced technique the pancreas appears unremarkable. Spleen: Normal in size without focal abnormality. Adrenals/Urinary Tract: The right kidney appears normal. Asymmetric atrophy of the left kidney. Normal adrenal glands. The urinary bladder is partially collapsed around a Foley catheter balloon. Stomach/Bowel: The stomach appears normal. The small bowel loops are normal in course and caliber. Normal appearance of the colon. There is a small caliber contrast filled structure within the right lower quadrant which is favored to represent a normal appendix, image 73 of series 3. No pathologic dilatation of the colon. Vascular/Lymphatic: Aortic atherosclerosis. No aneurysm. No adenopathy within the abdomen or pelvis. Reproductive: Uterus is not well visualized and may be atrophic or surgically absent. Other: There is asymmetric, high attenuation enlargement of the right ileo psoas muscle extending into the right pelvic sidewall and right inguinal region compatible with retroperitoneal and extraperitoneal hematoma of the abdomen and pelvis. Hematocrit arrival is identified within the right psoas muscle hematoma, image  number 74 of series 3. Hematoma including the psoas muscle measures 6.5 x 5.2 x 14.8 cm. Musculoskeletal: Mild degenerative disc disease identified within the lumbar spine. IMPRESSION: 1. A right retroperitoneal hematoma is suspected extending into the right side of pelvis and right groin region. A hematocrit level is identified within the asymmetrically enlarged right psoas muscle hematoma. 2. The appendix is normal. 3.  Aortic Atherosclerosis (ICD10-I70.0). 4. Gallbladder hydrops. Etiology and clinical significance indeterminate. This could be better assessed with right upper quadrant sonogram. 5. Bilateral pleural effusions with diminished aeration to the lung bases. 6. Critical Value/emergent results were called by telephone at the time of interpretation on 03/24/2017 at 5:59 pm to Dr. Dia Crawford , who verbally acknowledged these results. Electronically Signed   By: Kerby Moors M.D.   On: 03/24/2017 17:59   Dg Orthopantogram  Result Date: 03/23/2017 CLINICAL DATA:  Poor dentition.  Cardiac issues. EXAM: ORTHOPANTOGRAM/PANORAMIC COMPARISON:  None. FINDINGS: Mandible is intact. Numerous absent teeth. Multiple dental caries involving maxillary teeth, difficult to number due to overlapping incisors. Approximate tooth 3 periapical lucency/abscess. IMPRESSION: Poor dentition with multiple maxillary dental caries and, approximate tooth 3 periapical abscess. Electronically Signed   By: Elon Alas M.D.   On: 03/23/2017 19:56   Dg Chest 1 View  Result Date: 03/24/2017 CLINICAL DATA:  Post thoracentesis EXAM: CHEST 1 VIEW COMPARISON:  03/23/2017, 03/22/2017 FINDINGS: Right-sided catheter sheath remains in place with kink at the  supraclavicular region. The tip projects over the venous confluence. Decreased right-sided pleural effusion with improved aeration of the right lung base. Persistent small left effusion with dense consolidation at the left lung base. Hazy atelectasis or infiltrate at the right  lung base. No pneumothorax is seen. Stable cardiomegaly. IMPRESSION: 1. Decreased right pleural effusion post thoracentesis. No definitive pneumothorax seen 2. Continued left pleural effusion with dense left lower lobe atelectasis or pneumonia. Hazy atelectasis or infiltrate at the right base 3. Cardiomegaly unchanged Electronically Signed   By: Donavan Foil M.D.   On: 03/24/2017 16:29   Ir Nephro Tube Remov/fl  Result Date: 04/02/2017 INDICATION: Recent right nephrostogram for right hydronephrosis secondary to right retroperitoneal hematoma EXAM: ANTEGRADE NEPHROSTOGRAM THROUGH EXISTING NEPHROSTOMY MEDICATIONS: NONE. ANESTHESIA/SEDATION: NONE. COMPLICATIONS: None immediate. PROCEDURE: Under sterile conditions, the existing right nephrostomy was injected with contrast for an antegrade nephrostogram. Right nephrostogram: Catheter has retracted. Extravasation noted at the retroperitoneal access site. Contrast does opacify the collapsed collecting system without hydronephrosis. Contrast opacifies the ureter and eventually passes distally into the bladder without obstruction. There is deviation of the ureter medially related to the retroperitoneal hematoma. Nephrostomy catheter removed.  No immediate complication. IMPRESSION: Patent right collecting system and ureter without obstruction or hydronephrosis. Right nephrostomy removed. Electronically Signed   By: Jerilynn Mages.  Shick M.D.   On: 04/02/2017 12:36   Dg Chest Port 1 View  Result Date: 03/29/2017 CLINICAL DATA:  Pt has not had a BM since 9/21, c/o low back pain, recent nephrostomy tube placement, and recent retroperitoneal bleed. Pt has been groaning in pain for hours. EXAM: PORTABLE CHEST 1 VIEW COMPARISON:  03/26/2017 FINDINGS: Right IJV catheter is in place, crimped in the region of the neck. The heart is enlarged. There are hazy airspace filling opacities bilaterally, compatible with edema or infectious process. No pleural effusions. IMPRESSION: Cardiomegaly.   Suspect increased edema or infection. Electronically Signed   By: Nolon Nations M.D.   On: 03/29/2017 07:20   Dg Chest Port 1 View  Result Date: 03/26/2017 CLINICAL DATA:  Pneumonia EXAM: PORTABLE CHEST 1 VIEW COMPARISON:  03/24/2017 FINDINGS: Cardiomegaly. Right lung is clear. Continued left lower lobe airspace opacity and probable small left effusion. Aeration is slightly improved in the left base. Right internal jugular vascular sheath again noted, unchanged. IMPRESSION: Continued left pleural effusion with left lower lobe atelectasis or infiltrate, both improved since prior study. No confluent opacity on the right. Stable cardiomegaly. Electronically Signed   By: Rolm Baptise M.D.   On: 03/26/2017 10:37   Dg Chest Port 1 View  Result Date: 03/23/2017 CLINICAL DATA:  Follow-up bilateral alveolar opacities. EXAM: PORTABLE CHEST 1 VIEW COMPARISON:  Portable chest x-ray of March 22, 2017 FINDINGS: The lungs are adequately inflated. Bibasilar parenchymal consolidation persists. The interstitial markings elsewhere are mildly prominent. The cardiac silhouette is enlarged. The pulmonary vascularity is engorged. There is calcification in the wall of the aortic arch. The right internal jugular Cordis sheath tip projects over the proximal SVC. There is a kink in this catheter as it enters the scan. IMPRESSION: Bibasilar pneumonia. Small bilateral pleural effusions. Parenchymal consolidation at the right base has increased since yesterday's study. Mild CHF. Thoracic aortic atherosclerosis. Electronically Signed   By: David  Martinique M.D.   On: 03/23/2017 07:37   Dg Chest Port 1 View  Result Date: 03/22/2017 CLINICAL DATA:  Increasing shortness of breath EXAM: PORTABLE CHEST 1 VIEW COMPARISON:  March 21, 2017 FINDINGS: No pneumothorax. Interstitial opacities throughout the left lung  are stable. Effusion and opacity in the right base is mildly improved. Focal infiltrate in the left retrocardiac region is  unchanged. No other interval changes. IMPRESSION: Persistent pulmonary opacities as above.  Suggested small effusions. Electronically Signed   By: Dorise Bullion III M.D   On: 03/22/2017 10:22   Dg Chest Port 1 View  Result Date: 03/21/2017 CLINICAL DATA:  Shortness of breath. Concern for pulmonary edema. History of diabetes and hypertension. EXAM: PORTABLE CHEST 1 VIEW COMPARISON:  Chest x-rays dated 03/18/2017 and 03/17/2017. FINDINGS: Increasing central pulmonary vascular congestion and perihilar interstitial edema. Bibasilar opacities, some component likely related to associated pulmonary edema. Suspect additional atelectasis and/or small pleural effusions at each lung base. No pneumothorax seen. Stable cardiomegaly. Atherosclerotic changes noted at the aortic arch. Endotracheal tube and enteric tube have been removed. IMPRESSION: 1. Increasing central pulmonary vascular congestion and perihilar edema indicating CHF/volume overload. 2. Bibasilar opacities are not significantly changed, most likely a combination of atelectasis and small pleural effusions. Suspect some degree of superimposed pulmonary edema. Additional underlying pneumonia cannot be excluded if febrile. 3. Stable cardiomegaly. 4. Aortic atherosclerosis. Electronically Signed   By: Franki Cabot M.D.   On: 03/21/2017 09:20   Dg Chest Port 1 View  Result Date: 03/18/2017 CLINICAL DATA:  Respiratory failure EXAM: PORTABLE CHEST 1 VIEW COMPARISON:  03/17/2017 FINDINGS: Endotracheal tube and NG tube are unchanged. Cardiomegaly with vascular congestion. Layering right effusion with right lower lobe atelectasis or infiltrate and left retrocardiac opacity, unchanged. Possible small left effusion. IMPRESSION: Continued bilateral effusions, right greater the left with bibasilar atelectasis or infiltrates. Cardiomegaly, vascular congestion. Electronically Signed   By: Rolm Baptise M.D.   On: 03/18/2017 08:52   Portable Chest Xray  Result Date:  03/17/2017 CLINICAL DATA:  Intubated, respiratory failure EXAM: PORTABLE CHEST 1 VIEW COMPARISON:  Chest radiograph from one day prior. FINDINGS: Endotracheal tube tip is 5.4 cm above the carina. Enteric tube enters stomach with the tip not seen on this image. Right internal jugular central venous sheath terminates over the right upper mediastinum. Stable cardiomediastinal silhouette with mild cardiomegaly and aortic atherosclerosis. No pneumothorax. Small stable bilateral pleural effusions, right greater than left. Mild pulmonary edema, decreased. Bibasilar lung opacities are stable. IMPRESSION: 1. Well-positioned support structures.  No pneumothorax. 2. Mild congestive heart failure, improved. 3. Stable small bilateral pleural effusions, right greater than left with bibasilar lung opacities, favor atelectasis. Electronically Signed   By: Ilona Sorrel M.D.   On: 03/17/2017 07:55   Dg Chest Port 1 View  Result Date: 03/16/2017 CLINICAL DATA:  Central venous line placement. EXAM: PORTABLE CHEST 1 VIEW COMPARISON:  03/16/2017 at 1304 hours FINDINGS: New right internal jugular central venous introducer sheath has its tip projecting upper superior vena cava. No pneumothorax. Endotracheal tube and nasal/ orogastric tube are unchanged from prior exam, satisfactorily positioned. No change in the cardiomegaly or bilateral lung base opacities/pleural effusions. IMPRESSION: 1. New right internal jugular introducer sheath with its tip in the upper superior vena cava. No pneumothorax. No other change from the earlier study. Electronically Signed   By: Lajean Manes M.D.   On: 03/16/2017 15:16   Portable Chest Xray  Result Date: 03/16/2017 CLINICAL DATA:  66 year old female who coded today status post CPR EXAM: PORTABLE CHEST 1 VIEW COMPARISON:  0759 hours today and earlier. FINDINGS: Portable AP semi upright view at at 1304 hours. Intubated. Endotracheal tube tip in good position between the level the clavicles and  carina. Enteric tube placed and  courses to the abdomen. Side hole is at the level of the gastric body. Left and midline chest pacer or resuscitation pads in place. Stable cardiomegaly and mediastinal contours. Continued veiling opacity at both lung bases. No pneumothorax. Stable pulmonary vascularity, no acute edema. No areas of worsening ventilation. No displaced rib fracture or acute osseous abnormality identified. IMPRESSION: 1. Endotracheal tube tip in good position. Enteric tube courses to the abdomen, side hole at the level of the stomach. 2. Stable ventilation from earlier today; bilateral pleural effusions with lower lobe collapse or consolidation. 3. Stable cardiomegaly and mediastinal contours. Electronically Signed   By: Genevie Ann M.D.   On: 03/16/2017 13:27   Dg Chest Port 1 View  Result Date: 03/16/2017 CLINICAL DATA:  Increased SOB, CP. Abdominal pain and constipation. No BM in 48 hours. Hx of DM, HTN, stroke. Smoker. EXAM: PORTABLE CHEST 1 VIEW COMPARISON:  03/13/2017 FINDINGS: Stable cardiomegaly. Stable bilateral pleural effusions and bibasilar atelectasis. Stable pulmonary interstitial prominence, consistent with mild interstitial edema. No pneumothorax visualized. IMPRESSION: No significant change in cardiomegaly, bilateral pleural effusions, and probable mild pulmonary interstitial edema Electronically Signed   By: Earle Gell M.D.   On: 03/16/2017 08:16   Dg Chest Port 1 View  Result Date: 03/13/2017 CLINICAL DATA:  CHF. EXAM: PORTABLE CHEST 1 VIEW COMPARISON:  03/12/2017. FINDINGS: Cardiomegaly with bilateral from interstitial prominence and bilateral pleural effusions consistent CHF. No change prior exam. No evidence of pneumothorax. No acute bony abnormality. IMPRESSION: Congestive heart failure with pulmonary interstitial edema and bilateral pleural effusions. No change from prior exam. Electronically Signed   By: Marcello Moores  Register   On: 03/13/2017 06:23   Dg Abd Portable 1v  Result  Date: 03/29/2017 CLINICAL DATA:  Pt has not had a BM since 9/21, c/o low back pain, recent nephrostomy tube placement, and recent retroperitoneal bleed. Pt has been groaning in pain for hours. EXAM: PORTABLE ABDOMEN - 1 VIEW COMPARISON:  03/17/2017 FINDINGS: Right-sided percutaneous nephrostomy has been placed since the prior study. There is contrast within nondilated loops of colon. Average stool burden. No evidence for free intraperitoneal air or bowel obstruction. Scattered phleboliths are identified within the pelvis. IMPRESSION: Nonobstructive bowel gas pattern. Retained contrast, presumably follow up CT of the abdomen and pelvis on 03/26/2017. Electronically Signed   By: Nolon Nations M.D.   On: 03/29/2017 07:18   Dg Abd Portable 1v  Result Date: 03/17/2017 CLINICAL DATA:  Orogastric tube placement EXAM: PORTABLE ABDOMEN - 1 VIEW COMPARISON:  March 16, 2017 FINDINGS: Orogastric tube tip and side port in stomach. There is a paucity of bowel gas. There is no bowel dilatation or air-fluid level to suggest obstruction. No free air. There are pleural effusions bilaterally with bibasilar consolidation. There is cardiomegaly. IMPRESSION: Orogastric tube tip and side port in stomach. Paucity of bowel gas may be seen normally but may also be indicative of enteritis or early ileus. Bowel obstruction not felt to be likely. No free air. Cardiomegaly. Question alveolar edema versus pneumonia in the bases. Electronically Signed   By: Lowella Grip III M.D.   On: 03/17/2017 11:02   Dg Abd Portable 1v  Result Date: 03/16/2017 CLINICAL DATA:  Abdominal pain.  Constipation. EXAM: PORTABLE ABDOMEN - 1 VIEW COMPARISON:  None. FINDINGS: The bowel gas pattern is normal with minimal air scattered throughout bowel. No excessive stool in the colon. No fecal impaction. Extensive calcification in the abdominal aorta and iliac arteries. No significant bone abnormality. IMPRESSION: Benign-appearing abdomen.  No  excessive stool in the colon. Aortic atherosclerosis. Electronically Signed   By: Lorriane Shire M.D.   On: 03/16/2017 08:17   Ir Nephrostogram Right Thru Existing Access  Result Date: 04/02/2017 INDICATION: Recent right nephrostogram for right hydronephrosis secondary to right retroperitoneal hematoma EXAM: ANTEGRADE NEPHROSTOGRAM THROUGH EXISTING NEPHROSTOMY MEDICATIONS: NONE. ANESTHESIA/SEDATION: NONE. COMPLICATIONS: None immediate. PROCEDURE: Under sterile conditions, the existing right nephrostomy was injected with contrast for an antegrade nephrostogram. Right nephrostogram: Catheter has retracted. Extravasation noted at the retroperitoneal access site. Contrast does opacify the collapsed collecting system without hydronephrosis. Contrast opacifies the ureter and eventually passes distally into the bladder without obstruction. There is deviation of the ureter medially related to the retroperitoneal hematoma. Nephrostomy catheter removed.  No immediate complication. IMPRESSION: Patent right collecting system and ureter without obstruction or hydronephrosis. Right nephrostomy removed. Electronically Signed   By: Jerilynn Mages.  Shick M.D.   On: 04/02/2017 12:36   Ir Nephrostomy Placement Right  Result Date: 03/27/2017 INDICATION: Right-sided hydronephrosis secondary to ureteral obstruction. EXAM: IR NEPHROSTOMY PLACEMENT RIGHT COMPARISON:  CT of the abdomen on 03/26/2017 MEDICATIONS: Ciprofloxacin 400 mg IV; The antibiotic was administered in an appropriate time frame prior to skin puncture. ANESTHESIA/SEDATION: Fentanyl 100 mcg IV; Versed 4.0 mg IV Moderate Sedation Time:  10 minutes. The patient was continuously monitored during the procedure by the interventional radiology nurse under my direct supervision. CONTRAST:  10 mL Isovue-300 - administered into the collecting system(s) FLUOROSCOPY TIME:  Fluoroscopy Time: 1 minutes and 30 seconds (4.1 mGy). COMPLICATIONS: None immediate. PROCEDURE: Informed written  consent was obtained from the patient after a thorough discussion of the procedural risks, benefits and alternatives. All questions were addressed. Maximal Sterile Barrier Technique was utilized including caps, mask, sterile gowns, sterile gloves, sterile drape, hand hygiene and skin antiseptic. A timeout was performed prior to the initiation of the procedure. Ultrasound was used to localize the right kidney. Under direct ultrasound guidance, a 21 gauge needle was advanced into the collecting system. After aspiration of urine, contrast was injected. A guidewire was advanced into the collecting system. A transitional dilator was placed over the wire. The percutaneous tract was dilated over the wire and a 10 French percutaneous nephrostomy tube advanced. This was formed in the collecting system and a fluoroscopic spot image obtained after injection of contrast material. The catheter was flushed and connected to a gravity drainage bag. It was secured at the skin with a Prolene retention suture and StatLock device. FINDINGS: By ultrasound and also later with contrast injection, hydronephrosis of the right kidney does appear to be improved. However, given renal failure progressive hyperkalemia, there was felt to be indication to proceed with nephrostomy tube placement. The tube was placed via interpolar access with the catheter formed in the renal pelvis. IMPRESSION: Right-sided percutaneous nephrostomy tube placement with placement of a 10 French catheter formed at the level of the renal pelvis. The catheter was attached to gravity bag drainage. Electronically Signed   By: Aletta Edouard M.D.   On: 03/27/2017 16:21   Ir Thoracentesis Asp Pleural Space W/img Guide  Result Date: 03/24/2017 INDICATION: History of right-sided pleural effusion noted on x-ray. Request is made for diagnostic and therapeutic thoracentesis. EXAM: ULTRASOUND GUIDED DIAGNOSTIC AND THERAPEUTIC THORACENTESIS MEDICATIONS: 1% lidocaine  COMPLICATIONS: None immediate. PROCEDURE: An ultrasound guided thoracentesis was thoroughly discussed with the patient and questions answered. The benefits, risks, alternatives and complications were also discussed. The patient understands and wishes to proceed with the procedure. Written consent was obtained. Ultrasound was performed  to localize and mark an adequate pocket of fluid in the right chest. The area was then prepped and draped in the normal sterile fashion. 1% Lidocaine was used for local anesthesia. Under ultrasound guidance a Safe-T-Centesis catheter was introduced. Thoracentesis was performed. The catheter was removed and a dressing applied. FINDINGS: A total of approximately 0.35 L of serous fluid was removed. Samples were sent to the laboratory as requested by the clinical team. IMPRESSION: Successful ultrasound guided right thoracentesis yielding 0.35 L of pleural fluid. Read by: Saverio Danker, PA-C Electronically Signed   By: Aletta Edouard M.D.   On: 03/24/2017 16:38      Subjective: -Feeling a lot better, no chest pain, shortness of breath.  No abdominal pain, nausea or vomiting  Discharge Exam: Vitals:   04/04/17 0402 04/04/17 0839  BP: (!) 152/72 130/70  Pulse: (!) 53   Resp: 18   Temp: 98.4 F (36.9 C)   SpO2: 97% 97%    General: Pt is alert, awake, not in acute distress Cardiovascular: RRR, S1/S2 +, no rubs, no gallops Respiratory: CTA bilaterally, no wheezing, no rhonchi Abdominal: Soft, NT, ND, bowel sounds + Extremities: no edema, no cyanosis    The results of significant diagnostics from this hospitalization (including imaging, microbiology, ancillary and laboratory) are listed below for reference.     Microbiology: Recent Results (from the past 240 hour(s))  Urine Culture     Status: Abnormal   Collection Time: 03/26/17  8:07 AM  Result Value Ref Range Status   Specimen Description URINE, CATHETERIZED  Final   Special Requests NONE  Final   Culture  (A)  Final    >=100,000 COLONIES/mL KLEBSIELLA PNEUMONIAE >=100,000 COLONIES/mL ESCHERICHIA COLI    Report Status 03/29/2017 FINAL  Final   Organism ID, Bacteria KLEBSIELLA PNEUMONIAE (A)  Final   Organism ID, Bacteria ESCHERICHIA COLI (A)  Final      Susceptibility   Escherichia coli - MIC*    AMPICILLIN 4 SENSITIVE Sensitive     CEFAZOLIN <=4 SENSITIVE Sensitive     CEFTRIAXONE <=1 SENSITIVE Sensitive     CIPROFLOXACIN <=0.25 SENSITIVE Sensitive     GENTAMICIN <=1 SENSITIVE Sensitive     IMIPENEM <=0.25 SENSITIVE Sensitive     NITROFURANTOIN <=16 SENSITIVE Sensitive     TRIMETH/SULFA >=320 RESISTANT Resistant     AMPICILLIN/SULBACTAM <=2 SENSITIVE Sensitive     PIP/TAZO <=4 SENSITIVE Sensitive     Extended ESBL NEGATIVE Sensitive     * >=100,000 COLONIES/mL ESCHERICHIA COLI   Klebsiella pneumoniae - MIC*    AMPICILLIN >=32 RESISTANT Resistant     CEFAZOLIN <=4 SENSITIVE Sensitive     CEFTRIAXONE <=1 SENSITIVE Sensitive     CIPROFLOXACIN <=0.25 SENSITIVE Sensitive     GENTAMICIN <=1 SENSITIVE Sensitive     IMIPENEM <=0.25 SENSITIVE Sensitive     NITROFURANTOIN 64 INTERMEDIATE Intermediate     TRIMETH/SULFA <=20 SENSITIVE Sensitive     AMPICILLIN/SULBACTAM 4 SENSITIVE Sensitive     PIP/TAZO <=4 SENSITIVE Sensitive     Extended ESBL NEGATIVE Sensitive     * >=100,000 COLONIES/mL KLEBSIELLA PNEUMONIAE  Culture, blood (routine x 2)     Status: None   Collection Time: 03/26/17 10:40 AM  Result Value Ref Range Status   Specimen Description BLOOD LEFT HAND  Final   Special Requests IN PEDIATRIC BOTTLE Blood Culture adequate volume  Final   Culture NO GROWTH 5 DAYS  Final   Report Status 03/31/2017 FINAL  Final  Culture, blood (routine  x 2)     Status: Abnormal   Collection Time: 03/26/17 10:40 AM  Result Value Ref Range Status   Specimen Description BLOOD RIGHT HAND  Final   Special Requests IN PEDIATRIC BOTTLE Blood Culture adequate volume  Final   Culture  Setup Time   Final     GRAM NEGATIVE RODS IN PEDIATRIC BOTTLE CRITICAL RESULT CALLED TO, READ BACK BY AND VERIFIED WITH: G.ABBOTT PHARMD 03/27/17 0548 L.CHAMPION    Culture ESCHERICHIA COLI (A)  Final   Report Status 03/29/2017 FINAL  Final   Organism ID, Bacteria ESCHERICHIA COLI  Final      Susceptibility   Escherichia coli - MIC*    AMPICILLIN 4 SENSITIVE Sensitive     CEFAZOLIN <=4 SENSITIVE Sensitive     CEFEPIME <=1 SENSITIVE Sensitive     CEFTAZIDIME <=1 SENSITIVE Sensitive     CEFTRIAXONE <=1 SENSITIVE Sensitive     CIPROFLOXACIN <=0.25 SENSITIVE Sensitive     GENTAMICIN <=1 SENSITIVE Sensitive     IMIPENEM <=0.25 SENSITIVE Sensitive     TRIMETH/SULFA >=320 RESISTANT Resistant     AMPICILLIN/SULBACTAM <=2 SENSITIVE Sensitive     PIP/TAZO <=4 SENSITIVE Sensitive     Extended ESBL NEGATIVE Sensitive     * ESCHERICHIA COLI  Blood Culture ID Panel (Reflexed)     Status: Abnormal   Collection Time: 03/26/17 10:40 AM  Result Value Ref Range Status   Enterococcus species NOT DETECTED NOT DETECTED Final   Listeria monocytogenes NOT DETECTED NOT DETECTED Final   Staphylococcus species NOT DETECTED NOT DETECTED Final   Staphylococcus aureus NOT DETECTED NOT DETECTED Final   Streptococcus species NOT DETECTED NOT DETECTED Final   Streptococcus agalactiae NOT DETECTED NOT DETECTED Final   Streptococcus pneumoniae NOT DETECTED NOT DETECTED Final   Streptococcus pyogenes NOT DETECTED NOT DETECTED Final   Acinetobacter baumannii NOT DETECTED NOT DETECTED Final   Enterobacteriaceae species DETECTED (A) NOT DETECTED Final    Comment: Enterobacteriaceae represent a large family of gram-negative bacteria, not a single organism. CRITICAL RESULT CALLED TO, READ BACK BY AND VERIFIED WITH: G.ABBOTT PHARMD 03/27/17 0548 L.CHAMPION    Enterobacter cloacae complex NOT DETECTED NOT DETECTED Final   Escherichia coli DETECTED (A) NOT DETECTED Final    Comment: CRITICAL RESULT CALLED TO, READ BACK BY AND VERIFIED  WITH: G.ABBOTT PHARMD 03/27/17 0548 L.CHAMPION    Klebsiella oxytoca NOT DETECTED NOT DETECTED Final   Klebsiella pneumoniae NOT DETECTED NOT DETECTED Final   Proteus species NOT DETECTED NOT DETECTED Final   Serratia marcescens NOT DETECTED NOT DETECTED Final   Carbapenem resistance NOT DETECTED NOT DETECTED Final   Haemophilus influenzae NOT DETECTED NOT DETECTED Final   Neisseria meningitidis NOT DETECTED NOT DETECTED Final   Pseudomonas aeruginosa NOT DETECTED NOT DETECTED Final   Candida albicans NOT DETECTED NOT DETECTED Final   Candida glabrata NOT DETECTED NOT DETECTED Final   Candida krusei NOT DETECTED NOT DETECTED Final   Candida parapsilosis NOT DETECTED NOT DETECTED Final   Candida tropicalis NOT DETECTED NOT DETECTED Final  Culture, Urine     Status: Abnormal   Collection Time: 03/26/17  3:38 PM  Result Value Ref Range Status   Specimen Description URINE, RANDOM  Final   Special Requests NONE  Final   Culture MULTIPLE SPECIES PRESENT, SUGGEST RECOLLECTION (A)  Final   Report Status 03/27/2017 FINAL  Final  Culture, Urine     Status: None   Collection Time: 04/02/17  3:51 AM  Result Value Ref Range  Status   Specimen Description URINE, CATHETERIZED  Final   Special Requests NONE  Final   Culture NO GROWTH  Final   Report Status 04/03/2017 FINAL  Final  Culture, blood (routine x 2)     Status: None (Preliminary result)   Collection Time: 04/02/17  5:50 AM  Result Value Ref Range Status   Specimen Description BLOOD LEFT ANTECUBITAL  Final   Special Requests   Final    BOTTLES DRAWN AEROBIC AND ANAEROBIC Blood Culture adequate volume   Culture NO GROWTH 1 DAY  Final   Report Status PENDING  Incomplete  Culture, blood (routine x 2)     Status: None (Preliminary result)   Collection Time: 04/02/17  5:51 AM  Result Value Ref Range Status   Specimen Description BLOOD BLOOD LEFT HAND  Final   Special Requests   Final    BOTTLES DRAWN AEROBIC AND ANAEROBIC Blood Culture  adequate volume   Culture NO GROWTH 1 DAY  Final   Report Status PENDING  Incomplete     Labs: BNP (last 3 results)  Recent Labs  03/12/17 1400  BNP 3,710.6*   Basic Metabolic Panel:  Recent Labs Lab 03/28/17 2330  03/31/17 0416 04/01/17 0359 04/01/17 1500 04/02/17 0352 04/03/17 0401 04/04/17 0246  NA 127*  < > 137 137  --  137 135 138  K 4.1  < > 2.7* 3.2* 4.1 4.0 3.7 3.9  CL 94*  < > 99* 99*  --  105 102 106  CO2 21*  < > 24 24  --  20* 20* 20*  GLUCOSE 147*  < > 119* 120*  --  139* 149* 157*  BUN 51*  < > 43* 35*  --  34* 34* 29*  CREATININE 5.00*  < > 4.11* 3.62*  --  3.54* 3.72* 3.18*  CALCIUM 8.2*  < > 8.5* 9.0  --  9.0 8.7* 9.0  MG 1.8  --   --  1.6*  --  2.1 2.0 2.0  PHOS  --   < > 3.3 3.5  --  3.0 4.1 4.2  < > = values in this interval not displayed. Liver Function Tests:  Recent Labs Lab 03/28/17 2330  03/31/17 0416 04/01/17 0359 04/02/17 0352 04/03/17 0401 04/04/17 0246  AST 48*  --   --   --   --   --   --   ALT 90*  --   --   --   --   --   --   ALKPHOS 129*  --   --   --   --   --   --   BILITOT 1.4*  --   --   --   --   --   --   PROT 5.7*  --   --   --   --   --   --   ALBUMIN 2.7*  < > 2.6* 2.9* 2.9* 2.9* 3.1*  < > = values in this interval not displayed. No results for input(s): LIPASE, AMYLASE in the last 168 hours. No results for input(s): AMMONIA in the last 168 hours. CBC:  Recent Labs Lab 03/29/17 1910 03/30/17 0616 03/31/17 0416 04/01/17 0359 04/02/17 0352  WBC 13.4* 10.2 10.8* 10.6* 10.3  HGB 7.8* 9.5* 8.9* 10.4* 9.7*  HCT 23.2* 28.2* 25.8* 30.8* 29.3*  MCV 78.1 78.3 78.7 79.6 80.1  PLT 149* 148* 194 246 259   Cardiac Enzymes:  Recent Labs Lab 04/01/17 2200  TROPONINI  0.04*   BNP: Invalid input(s): POCBNP CBG:  Recent Labs Lab 04/03/17 1205 04/03/17 1706 04/03/17 2046 04/04/17 0726 04/04/17 1231  GLUCAP 172* 195* 173* 169* 121*   D-Dimer No results for input(s): DDIMER in the last 72 hours. Hgb A1c No  results for input(s): HGBA1C in the last 72 hours. Lipid Profile No results for input(s): CHOL, HDL, LDLCALC, TRIG, CHOLHDL, LDLDIRECT in the last 72 hours. Thyroid function studies No results for input(s): TSH, T4TOTAL, T3FREE, THYROIDAB in the last 72 hours.  Invalid input(s): FREET3 Anemia work up No results for input(s): VITAMINB12, FOLATE, FERRITIN, TIBC, IRON, RETICCTPCT in the last 72 hours. Urinalysis    Component Value Date/Time   COLORURINE YELLOW 03/26/2017 1539   APPEARANCEUR CLOUDY (A) 03/26/2017 1539   LABSPEC 1.010 03/26/2017 1539   PHURINE 6.5 03/26/2017 1539   GLUCOSEU NEGATIVE 03/26/2017 1539   HGBUR LARGE (A) 03/26/2017 1539   BILIRUBINUR NEGATIVE 03/26/2017 1539   KETONESUR NEGATIVE 03/26/2017 1539   PROTEINUR >300 (A) 03/26/2017 1539   NITRITE NEGATIVE 03/26/2017 1539   LEUKOCYTESUR LARGE (A) 03/26/2017 1539   Sepsis Labs Invalid input(s): PROCALCITONIN,  WBC,  LACTICIDVEN   Time coordinating discharge: 45 minutes  SIGNED:  Marzetta Board, MD  [facility] 04/04/2017, 12:40 PM Pager [phone]  If 7PM-7AM, please contact night-coverage [email or web address] Password [email or web address]

## 2017-04-04 NOTE — Progress Notes (Signed)
Pt is stable. Vital stable,  Family members in bedside, Pt is leaving for [facility], report provided to the receiving nurse, will continue to monitor

## 2017-04-04 NOTE — Progress Notes (Signed)
Pt left to [place] via [facility] @5 .20pm in a stable condition with all of her belongings accompanied with family members, report provided to the receiving nurse, IV and TELE discontinue, report also given to [facility] along with necessary discharge papers and prescription.  Palma Holter, RN

## 2017-04-06 ENCOUNTER — Telehealth (HOSPITAL_COMMUNITY): Payer: Self-pay | Admitting: Vascular Surgery

## 2017-04-06 NOTE — Telephone Encounter (Signed)
Left pt message giving her hosp f/u appt w/ [facility] OCT 18, asked pt to call back to confirm appt date and time

## 2017-04-07 LAB — CULTURE, BLOOD (ROUTINE X 2)
Culture: NO GROWTH
Culture: NO GROWTH
Special Requests: ADEQUATE
Special Requests: ADEQUATE

## 2017-04-12 ENCOUNTER — Inpatient Hospital Stay (HOSPITAL_COMMUNITY): Payer: Medicare Other

## 2017-04-12 ENCOUNTER — Inpatient Hospital Stay (HOSPITAL_COMMUNITY)
Admission: AD | Admit: 2017-04-12 | Discharge: 2017-04-23 | DRG: 291 | Disposition: A | Discharge status: Skilled Nursing Facility | Payer: Medicare Other | Source: Other Acute Inpatient Hospital | Attending: Internal Medicine | Admitting: Internal Medicine

## 2017-04-12 DIAGNOSIS — I5033 Acute on chronic diastolic (congestive) heart failure: Secondary | ICD-10-CM | POA: Diagnosis present

## 2017-04-12 DIAGNOSIS — N1330 Unspecified hydronephrosis: Secondary | ICD-10-CM

## 2017-04-12 DIAGNOSIS — I2510 Atherosclerotic heart disease of native coronary artery without angina pectoris: Secondary | ICD-10-CM | POA: Diagnosis present

## 2017-04-12 DIAGNOSIS — Z794 Long term (current) use of insulin: Secondary | ICD-10-CM

## 2017-04-12 DIAGNOSIS — R748 Abnormal levels of other serum enzymes: Secondary | ICD-10-CM | POA: Diagnosis not present

## 2017-04-12 DIAGNOSIS — K219 Gastro-esophageal reflux disease without esophagitis: Secondary | ICD-10-CM | POA: Diagnosis present

## 2017-04-12 DIAGNOSIS — I349 Nonrheumatic mitral valve disorder, unspecified: Secondary | ICD-10-CM

## 2017-04-12 DIAGNOSIS — J9601 Acute respiratory failure with hypoxia: Secondary | ICD-10-CM | POA: Diagnosis present

## 2017-04-12 DIAGNOSIS — Y95 Nosocomial condition: Secondary | ICD-10-CM | POA: Diagnosis present

## 2017-04-12 DIAGNOSIS — E1122 Type 2 diabetes mellitus with diabetic chronic kidney disease: Secondary | ICD-10-CM | POA: Diagnosis present

## 2017-04-12 DIAGNOSIS — N184 Chronic kidney disease, stage 4 (severe): Secondary | ICD-10-CM | POA: Diagnosis present

## 2017-04-12 DIAGNOSIS — I081 Rheumatic disorders of both mitral and tricuspid valves: Secondary | ICD-10-CM | POA: Diagnosis present

## 2017-04-12 DIAGNOSIS — J189 Pneumonia, unspecified organism: Secondary | ICD-10-CM | POA: Diagnosis present

## 2017-04-12 DIAGNOSIS — Z79890 Hormone replacement therapy (postmenopausal): Secondary | ICD-10-CM

## 2017-04-12 DIAGNOSIS — E039 Hypothyroidism, unspecified: Secondary | ICD-10-CM | POA: Diagnosis present

## 2017-04-12 DIAGNOSIS — Z87892 Personal history of anaphylaxis: Secondary | ICD-10-CM

## 2017-04-12 DIAGNOSIS — J810 Acute pulmonary edema: Secondary | ICD-10-CM

## 2017-04-12 DIAGNOSIS — I130 Hypertensive heart and chronic kidney disease with heart failure and stage 1 through stage 4 chronic kidney disease, or unspecified chronic kidney disease: Secondary | ICD-10-CM | POA: Diagnosis present

## 2017-04-12 DIAGNOSIS — Z888 Allergy status to other drugs, medicaments and biological substances status: Secondary | ICD-10-CM

## 2017-04-12 DIAGNOSIS — R778 Other specified abnormalities of plasma proteins: Secondary | ICD-10-CM

## 2017-04-12 DIAGNOSIS — Z7982 Long term (current) use of aspirin: Secondary | ICD-10-CM

## 2017-04-12 DIAGNOSIS — E1121 Type 2 diabetes mellitus with diabetic nephropathy: Secondary | ICD-10-CM

## 2017-04-12 DIAGNOSIS — Z992 Dependence on renal dialysis: Secondary | ICD-10-CM

## 2017-04-12 DIAGNOSIS — R7989 Other specified abnormal findings of blood chemistry: Secondary | ICD-10-CM

## 2017-04-12 DIAGNOSIS — Z978 Presence of other specified devices: Secondary | ICD-10-CM

## 2017-04-12 DIAGNOSIS — Z681 Body mass index (BMI) 19 or less, adult: Secondary | ICD-10-CM

## 2017-04-12 DIAGNOSIS — E43 Unspecified severe protein-calorie malnutrition: Secondary | ICD-10-CM | POA: Insufficient documentation

## 2017-04-12 DIAGNOSIS — E1165 Type 2 diabetes mellitus with hyperglycemia: Secondary | ICD-10-CM | POA: Diagnosis not present

## 2017-04-12 DIAGNOSIS — E785 Hyperlipidemia, unspecified: Secondary | ICD-10-CM | POA: Diagnosis present

## 2017-04-12 DIAGNOSIS — F419 Anxiety disorder, unspecified: Secondary | ICD-10-CM | POA: Diagnosis not present

## 2017-04-12 DIAGNOSIS — I340 Nonrheumatic mitral (valve) insufficiency: Secondary | ICD-10-CM | POA: Diagnosis not present

## 2017-04-12 DIAGNOSIS — D638 Anemia in other chronic diseases classified elsewhere: Secondary | ICD-10-CM | POA: Diagnosis present

## 2017-04-12 DIAGNOSIS — F40240 Claustrophobia: Secondary | ICD-10-CM | POA: Diagnosis present

## 2017-04-12 DIAGNOSIS — Z23 Encounter for immunization: Secondary | ICD-10-CM

## 2017-04-12 DIAGNOSIS — E876 Hypokalemia: Secondary | ICD-10-CM | POA: Diagnosis present

## 2017-04-12 DIAGNOSIS — N186 End stage renal disease: Secondary | ICD-10-CM

## 2017-04-12 DIAGNOSIS — I051 Rheumatic mitral insufficiency: Secondary | ICD-10-CM

## 2017-04-12 DIAGNOSIS — I483 Typical atrial flutter: Secondary | ICD-10-CM | POA: Diagnosis not present

## 2017-04-12 DIAGNOSIS — E7849 Other hyperlipidemia: Secondary | ICD-10-CM | POA: Diagnosis not present

## 2017-04-12 DIAGNOSIS — F17210 Nicotine dependence, cigarettes, uncomplicated: Secondary | ICD-10-CM | POA: Diagnosis present

## 2017-04-12 DIAGNOSIS — I5031 Acute diastolic (congestive) heart failure: Secondary | ICD-10-CM | POA: Diagnosis not present

## 2017-04-12 DIAGNOSIS — I252 Old myocardial infarction: Secondary | ICD-10-CM

## 2017-04-12 DIAGNOSIS — Z8673 Personal history of transient ischemic attack (TIA), and cerebral infarction without residual deficits: Secondary | ICD-10-CM

## 2017-04-12 DIAGNOSIS — I509 Heart failure, unspecified: Secondary | ICD-10-CM

## 2017-04-12 DIAGNOSIS — N179 Acute kidney failure, unspecified: Secondary | ICD-10-CM | POA: Diagnosis present

## 2017-04-12 DIAGNOSIS — I480 Paroxysmal atrial fibrillation: Secondary | ICD-10-CM | POA: Diagnosis present

## 2017-04-12 DIAGNOSIS — I110 Hypertensive heart disease with heart failure: Secondary | ICD-10-CM | POA: Diagnosis not present

## 2017-04-12 DIAGNOSIS — Z90710 Acquired absence of both cervix and uterus: Secondary | ICD-10-CM

## 2017-04-12 DIAGNOSIS — R079 Chest pain, unspecified: Secondary | ICD-10-CM

## 2017-04-12 DIAGNOSIS — E1129 Type 2 diabetes mellitus with other diabetic kidney complication: Secondary | ICD-10-CM | POA: Diagnosis not present

## 2017-04-12 DIAGNOSIS — R0600 Dyspnea, unspecified: Secondary | ICD-10-CM

## 2017-04-12 HISTORY — DX: Chronic kidney disease, stage 4 (severe): N18.4

## 2017-04-12 LAB — COMPREHENSIVE METABOLIC PANEL
ALT: 33 U/L (ref 14–54)
AST: 29 U/L (ref 15–41)
Albumin: 2.4 g/dL — ABNORMAL LOW (ref 3.5–5.0)
Alkaline Phosphatase: 107 U/L (ref 38–126)
Anion gap: 11 (ref 5–15)
BUN: 40 mg/dL — ABNORMAL HIGH (ref 6–20)
CO2: 19 mmol/L — ABNORMAL LOW (ref 22–32)
Calcium: 9.1 mg/dL (ref 8.9–10.3)
Chloride: 100 mmol/L — ABNORMAL LOW (ref 101–111)
Creatinine, Ser: 3.75 mg/dL — ABNORMAL HIGH (ref 0.44–1.00)
GFR calc Af Amer: 14 mL/min — ABNORMAL LOW (ref >60)
GFR calc non Af Amer: 12 mL/min — ABNORMAL LOW (ref >60)
Glucose, Bld: 176 mg/dL — ABNORMAL HIGH (ref 65–99)
Potassium: 4.1 mmol/L (ref 3.5–5.1)
Sodium: 130 mmol/L — ABNORMAL LOW (ref 135–145)
Total Bilirubin: 1.3 mg/dL — ABNORMAL HIGH (ref 0.3–1.2)
Total Protein: 5.8 g/dL — ABNORMAL LOW (ref 6.5–8.1)

## 2017-04-12 LAB — GLUCOSE, CAPILLARY
Glucose-Capillary: 203 mg/dL — ABNORMAL HIGH (ref 65–99)
Glucose-Capillary: 237 mg/dL — ABNORMAL HIGH (ref 65–99)

## 2017-04-12 LAB — PROCALCITONIN: Procalcitonin: 2.06 ng/mL

## 2017-04-12 LAB — MRSA PCR SCREENING: MRSA by PCR: NEGATIVE

## 2017-04-12 LAB — TROPONIN I: Troponin I: 0.04 ng/mL (ref <0.03)

## 2017-04-12 LAB — PHOSPHORUS: Phosphorus: 4 mg/dL (ref 2.5–4.6)

## 2017-04-12 LAB — STREP PNEUMONIAE URINARY ANTIGEN: Strep Pneumo Urinary Antigen: NEGATIVE

## 2017-04-12 LAB — PROTIME-INR
INR: 1.46
Prothrombin Time: 17.6 seconds — ABNORMAL HIGH (ref 11.4–15.2)

## 2017-04-12 LAB — MAGNESIUM: Magnesium: 1.6 mg/dL — ABNORMAL LOW (ref 1.7–2.4)

## 2017-04-12 LAB — APTT: aPTT: 35 seconds (ref 24–36)

## 2017-04-12 MED ORDER — SODIUM BICARBONATE 650 MG PO TABS
650.0000 mg | ORAL_TABLET | Freq: Two times a day (BID) | ORAL | Status: DC
  Administered 2017-04-12 – 2017-04-17 (×10): 650 mg via ORAL
  Filled 2017-04-12 (×11): qty 1

## 2017-04-12 MED ORDER — VANCOMYCIN HCL IN DEXTROSE 750-5 MG/150ML-% IV SOLN
750.0000 mg | INTRAVENOUS | Status: DC
  Administered 2017-04-14: 750 mg via INTRAVENOUS
  Filled 2017-04-12: qty 150

## 2017-04-12 MED ORDER — PANTOPRAZOLE SODIUM 40 MG PO TBEC
40.0000 mg | DELAYED_RELEASE_TABLET | Freq: Every day | ORAL | Status: DC
  Administered 2017-04-12 – 2017-04-20 (×9): 40 mg via ORAL
  Filled 2017-04-12 (×9): qty 1

## 2017-04-12 MED ORDER — MORPHINE SULFATE (PF) 2 MG/ML IV SOLN
INTRAVENOUS | Status: AC
  Filled 2017-04-12: qty 1

## 2017-04-12 MED ORDER — ALBUTEROL SULFATE (2.5 MG/3ML) 0.083% IN NEBU: 2.5000 mg | INHALATION_SOLUTION | RESPIRATORY_TRACT | Status: DC | PRN

## 2017-04-12 MED ORDER — DULOXETINE HCL 30 MG PO CPEP
30.0000 mg | ORAL_CAPSULE | Freq: Every day | ORAL | Status: DC
  Administered 2017-04-12 – 2017-04-23 (×12): 30 mg via ORAL
  Filled 2017-04-12 (×12): qty 1

## 2017-04-12 MED ORDER — PIPERACILLIN-TAZOBACTAM IN DEX 2-0.25 GM/50ML IV SOLN
2.2500 g | Freq: Three times a day (TID) | INTRAVENOUS | Status: AC
  Administered 2017-04-13 – 2017-04-17 (×15): 2.25 g via INTRAVENOUS
  Filled 2017-04-12 (×15): qty 50

## 2017-04-12 MED ORDER — VITAMIN D 1000 UNITS PO TABS
1000.0000 [IU] | ORAL_TABLET | Freq: Every day | ORAL | Status: DC
  Administered 2017-04-13 – 2017-04-23 (×11): 1000 [IU] via ORAL
  Filled 2017-04-12 (×11): qty 1

## 2017-04-12 MED ORDER — ONDANSETRON HCL 4 MG/2ML IJ SOLN
4.0000 mg | Freq: Four times a day (QID) | INTRAMUSCULAR | Status: DC | PRN
  Administered 2017-04-12 – 2017-04-23 (×7): 4 mg via INTRAVENOUS
  Filled 2017-04-12 (×7): qty 2

## 2017-04-12 MED ORDER — INSULIN ASPART 100 UNIT/ML ~~LOC~~ SOLN
0.0000 [IU] | Freq: Every day | SUBCUTANEOUS | Status: DC
  Administered 2017-04-13: 2 [IU] via SUBCUTANEOUS

## 2017-04-12 MED ORDER — MORPHINE SULFATE (PF) 2 MG/ML IV SOLN
2.0000 mg | INTRAVENOUS | Status: AC
  Administered 2017-04-12: 2 mg via INTRAVENOUS

## 2017-04-12 MED ORDER — MONTELUKAST SODIUM 10 MG PO TABS
10.0000 mg | ORAL_TABLET | Freq: Every day | ORAL | Status: DC
  Administered 2017-04-12 – 2017-04-23 (×12): 10 mg via ORAL
  Filled 2017-04-12 (×12): qty 1

## 2017-04-12 MED ORDER — ASPIRIN EC 81 MG PO TBEC
81.0000 mg | DELAYED_RELEASE_TABLET | Freq: Every day | ORAL | Status: DC
  Administered 2017-04-12 – 2017-04-23 (×12): 81 mg via ORAL
  Filled 2017-04-12 (×12): qty 1

## 2017-04-12 MED ORDER — BUPROPION HCL ER (XL) 300 MG PO TB24
300.0000 mg | ORAL_TABLET | Freq: Every day | ORAL | Status: DC
  Administered 2017-04-12 – 2017-04-23 (×12): 300 mg via ORAL
  Filled 2017-04-12 (×12): qty 1

## 2017-04-12 MED ORDER — ATORVASTATIN CALCIUM 40 MG PO TABS
40.0000 mg | ORAL_TABLET | Freq: Every day | ORAL | Status: DC
  Administered 2017-04-12 – 2017-04-23 (×12): 40 mg via ORAL
  Filled 2017-04-12 (×12): qty 1

## 2017-04-12 MED ORDER — MORPHINE SULFATE (PF) 2 MG/ML IV SOLN
1.0000 mg | INTRAVENOUS | Status: AC
  Administered 2017-04-12: 1 mg via INTRAVENOUS
  Filled 2017-04-12: qty 1

## 2017-04-12 MED ORDER — SODIUM CHLORIDE 0.9 % IV SOLN
INTRAVENOUS | Status: DC
  Administered 2017-04-12: 20:00:00 via INTRAVENOUS
  Administered 2017-04-14: 10 mL/h via INTRAVENOUS
  Administered 2017-04-17: 14:00:00 via INTRAVENOUS

## 2017-04-12 MED ORDER — FUROSEMIDE 10 MG/ML IJ SOLN
80.0000 mg | Freq: Three times a day (TID) | INTRAMUSCULAR | Status: DC
  Administered 2017-04-12 – 2017-04-13 (×2): 80 mg via INTRAVENOUS
  Filled 2017-04-12 (×3): qty 8

## 2017-04-12 MED ORDER — VANCOMYCIN HCL IN DEXTROSE 1-5 GM/200ML-% IV SOLN
1000.0000 mg | Freq: Once | INTRAVENOUS | Status: DC
  Filled 2017-04-12: qty 200

## 2017-04-12 MED ORDER — INSULIN ASPART 100 UNIT/ML ~~LOC~~ SOLN
0.0000 [IU] | Freq: Three times a day (TID) | SUBCUTANEOUS | Status: DC
Start: 2017-04-13 — End: 2017-04-13
  Administered 2017-04-13: 2 [IU] via SUBCUTANEOUS

## 2017-04-12 MED ORDER — PIPERACILLIN-TAZOBACTAM 3.375 G IVPB 30 MIN
3.3750 g | Freq: Once | INTRAVENOUS | Status: AC
  Administered 2017-04-12: 3.375 g via INTRAVENOUS
  Filled 2017-04-12: qty 50

## 2017-04-12 MED ORDER — LEVOTHYROXINE SODIUM 112 MCG PO TABS
112.0000 ug | ORAL_TABLET | Freq: Every day | ORAL | Status: DC
  Administered 2017-04-13 – 2017-04-22 (×10): 112 ug via ORAL
  Filled 2017-04-12 (×12): qty 1

## 2017-04-12 MED ORDER — BUSPIRONE HCL 10 MG PO TABS
15.0000 mg | ORAL_TABLET | Freq: Two times a day (BID) | ORAL | Status: DC
  Administered 2017-04-12 – 2017-04-23 (×22): 15 mg via ORAL
  Filled 2017-04-12: qty 1
  Filled 2017-04-12: qty 2
  Filled 2017-04-12 (×7): qty 1
  Filled 2017-04-12 (×2): qty 2
  Filled 2017-04-12 (×12): qty 1
  Filled 2017-04-12: qty 2

## 2017-04-12 MED ORDER — MORPHINE SULFATE (PF) 2 MG/ML IV SOLN
2.0000 mg | INTRAVENOUS | Status: DC | PRN
  Administered 2017-04-12 – 2017-04-20 (×13): 2 mg via INTRAVENOUS
  Filled 2017-04-12 (×13): qty 1

## 2017-04-12 MED ORDER — FERROUS SULFATE 325 (65 FE) MG PO TABS
325.0000 mg | ORAL_TABLET | Freq: Two times a day (BID) | ORAL | Status: DC
  Administered 2017-04-13 – 2017-04-23 (×21): 325 mg via ORAL
  Filled 2017-04-12 (×24): qty 1

## 2017-04-12 MED ORDER — SODIUM BICARBONATE 650 MG PO TABS: 1300.0000 mg | ORAL_TABLET | Freq: Two times a day (BID) | ORAL | Status: DC

## 2017-04-12 MED ORDER — NITROGLYCERIN IN D5W 200-5 MCG/ML-% IV SOLN
0.0000 ug/min | INTRAVENOUS | Status: DC
  Administered 2017-04-12: 5 ug/min via INTRAVENOUS
  Administered 2017-04-13 – 2017-04-14 (×2): 40 ug/min via INTRAVENOUS
  Administered 2017-04-14: 30 ug/min via INTRAVENOUS
  Filled 2017-04-12 (×3): qty 250

## 2017-04-12 MED ORDER — HYDRALAZINE HCL 50 MG PO TABS
75.0000 mg | ORAL_TABLET | Freq: Three times a day (TID) | ORAL | Status: DC
  Administered 2017-04-12 – 2017-04-14 (×4): 75 mg via ORAL
  Filled 2017-04-12 (×6): qty 1

## 2017-04-12 NOTE — Progress Notes (Signed)
CRITICAL VALUE ALERT  Critical Value:  Troponin 0.04  Date & Time Notied: 04/12/17 2131  Provider Notified: Dellia Nims RN/ Dr Oletta Darter  Orders Received/Actions taken:No new orders given at this time

## 2017-04-12 NOTE — Progress Notes (Signed)
Pt arrived on the unit on non rebreather mask (97%). VS stable. Alert, oriented x4.  Bed in low position, alarms are on, call bell in reach. MD aware. Continue to monitor

## 2017-04-12 NOTE — H&P (Signed)
PULMONARY / CRITICAL CARE MEDICINE   Name: Jocelyn Hill. Orchard MRN: [id number] DOB: 1950-08-14    ADMISSION DATE:  04/12/2017 CONSULTATION DATE:10/14  REFERRING MD:  Odie Sera   CHIEF COMPLAINT:  Acute respiratory failure  HISTORY OF PRESENT ILLNESS:   This is 66 year old female with a significant recent medical history of diastolic heart failure severe mitral valve disease felt due to underlying rheumatic heart disease, stage IV CK D  she left the hospital recently with a creatinine 17f 3.18, paroxysmal atrial fibrillation, recent Escherichia coli bacteremia with pyelonephritis, diabetes, and recent retroperitoneal hematoma. She was discharged on 10/6. During this hospitalization she was diuresed over 26 L her weight went from 143 pounds on admission down to 123 she was deemed euvolemic upon time of discharge. Following discharge she was discharged to a skilled nursing facility with the plan to receive aggressive rehabilitation, and eventually be seen by dental medicine for dental extractions in hopes of eventually undergoing mitral valve surgery should she recover to the point thoracic surgery team for a potential candidate. Her diuretics were stopped upon time of admission to the nursing home, possibly 3 days prior to presentation at [facility] emergency rooms he began to notice increased shortness of breath, a cough with some blood in her sputum, increased weakness, increased swelling, and her lower extremities as well as increase in frequency of chest pain primarily substernal. In the emergency room she was found to have saturations in the 70s chest x-ray consistent with pulmonary edema per report 12-lead showing atrial fibrillation with prolonged QT as well as BNP of over 30,000. Given her prolonged recent hospital stay, and multiple comorbidities as well as prior workup done here, on admission to our intensive care was requested.  PAST MEDICAL HISTORY :  She  has a past medical history of Anxiety;  Arthritis; Bronchitis; Depression; Diabetes mellitus without complication ([place]); Diet-controlled diabetes mellitus ([place]); GERD (gastroesophageal reflux disease); Hyperlipidemia; Hypertension; Hypothyroidism; and Stroke [facility]).  PAST SURGICAL HISTORY: She  has a past surgical history that includes Abdominal hysterectomy; Tubal ligation; Tonsillectomy; IR THORACENTESIS ASP PLEURAL SPACE W/IMG GUIDE (03/24/2017); IR NEPHROSTOMY PLACEMENT RIGHT (03/27/2017); IR NEPHROSTOGRAM RIGHT THRU EXISTING ACCESS (04/02/2017); and IR Nephro Tube Remov/FL (04/02/2017).  Allergies  Allergen Reactions  . Ace Inhibitors Anaphylaxis and Swelling    No current facility-administered medications on file prior to encounter.    Current Outpatient Prescriptions on File Prior to Encounter  Medication Sig  . albuterol (PROVENTIL HFA;VENTOLIN HFA) 108 (90 Base) MCG/ACT inhaler Inhale 2 puffs into the lungs every 4 (four) hours as needed for wheezing or shortness of breath.  Marland Kitchen amiodarone (PACERONE) 100 MG tablet Take 2 tablets (200 mg total) by mouth daily.  Marland Kitchen amLODipine (NORVASC) 10 MG tablet Take 10 mg by mouth daily.  Marland Kitchen amoxicillin-clavulanate (AUGMENTIN) 875-125 MG tablet Take 1 tablet by mouth 2 (two) times daily. For 7 more days  . aspirin EC 81 MG tablet Take 81 mg by mouth daily.  Marland Kitchen atorvastatin (LIPITOR) 40 MG tablet Take 40 mg by mouth daily.  Marland Kitchen b complex vitamins capsule Take 1 capsule by mouth daily.  Marland Kitchen buPROPion (WELLBUTRIN XL) 300 MG 24 hr tablet Take 300 mg by mouth daily.  . busPIRone (BUSPAR) 15 MG tablet Take 15 mg by mouth 2 (two) times daily.  . cholecalciferol (VITAMIN D) 1000 units tablet Take 1,000 Units by mouth daily.  . DULoxetine (CYMBALTA) 30 MG capsule Take 30 mg by mouth daily.  . ferrous sulfate 325 (65 FE) MG tablet Take  1 tablet (325 mg total) by mouth 2 (two) times daily with a meal.  . gabapentin (NEURONTIN) 300 MG capsule Take 300 mg by mouth at bedtime.  . hydrALAZINE (APRESOLINE) 25 MG  tablet Take 3 tablets (75 mg total) by mouth every 8 (eight) hours.  . insulin glargine (LANTUS) 100 UNIT/ML injection Inject 0.04 mLs (4 Units total) into the skin daily.  Marland Kitchen levothyroxine (SYNTHROID, LEVOTHROID) 112 MCG tablet Take 112 mcg by mouth daily before breakfast.  . montelukast (SINGULAIR) 10 MG tablet Take 10 mg by mouth daily.  . Oxycodone HCl 10 MG TABS Take 0.5 tablets (5 mg total) by mouth 3 (three) times daily as needed (pain).  . pantoprazole (PROTONIX) 40 MG tablet Take 40 mg by mouth daily.  . pregabalin (LYRICA) 150 MG capsule Take 150 mg by mouth daily.  Marland Kitchen tiZANidine (ZANAFLEX) 4 MG capsule Take 4 mg by mouth 3 (three) times daily.    FAMILY HISTORY:  Her has no family status information on file.    SOCIAL HISTORY: She  reports that she has been smoking Cigarettes.  She has never used smokeless tobacco. She reports that she does not drink alcohol or use drugs.  REVIEW OF SYSTEMS:   Gen.: Positive for low-grade fever, increased weakness,increased fatigue. HEENT denied headache, nasal discharge, sore throat, dizziness. Pulmonary: Positive for worsening shortness of breath. Has had scant hemoptysis on a couple occasions. No wheezing. Has had chest Discomfort. Cardiac: Positive for swelling in the lower extremities. Chest pain primarily is substernal, has had radiation in the past but not currently. No palpitations. Abdomen: Poor appetite, no nausea vomiting or diarrhea GU: Voiding without difficulty.Endocrine: Denies hot or cold intolerance neuro: She does have what appears to be a new intention tremor. She states that this is only been going on since her admission to the hospital. Psych: Reports significant anxiety.  SUBJECTIVE:  Endorses chest pain, as well as anxiety requesting medicine to help with anxiety  VITAL SIGNS: BP (!) 166/80   Pulse (!) 59   Temp 98.5 F (36.9 C) (Oral)   Resp (!) 21   SpO2 99%  100% nonrebreather HEMODYNAMICS:    VENTILATOR  SETTINGS:    INTAKE / OUTPUT: No intake/output data recorded.  PHYSICAL EXAMINATION: General:  Acute on chronically ill-appearing 66 year old female currently with high flow mask in place. She has marked dyspnea, and is unable to speak more than few word phrases without shortness of breath Neuro:   Awake alert no focal deficits does have tremor with exertion HEENT:  Normocephalic atraumatic mild jugular venous distention her dentition is quite poor Cardiovascular:  Irregular irregular atrial fibrillation on monitor she has a 3/6 systolic murmur Lungs:  Basilar rales posteriorly positive accessory muscle use Abdomen:  Soft nontender no organomegaly Musculoskeletal:  Equal strength and bulk Skin:  Warm and dry compression stockings in place the lowest there is bilateral edema as well as anasarca  LABS:  BMET No results for input(s): NA, K, CL, CO2, BUN, CREATININE, GLUCOSE in the last 168 hours.  Electrolytes No results for input(s): CALCIUM, MG, PHOS in the last 168 hours.  CBC No results for input(s): WBC, HGB, HCT, PLT in the last 168 hours.  Coag's No results for input(s): APTT, INR in the last 168 hours.  Sepsis Markers No results for input(s): LATICACIDVEN, PROCALCITON, O2SATVEN in the last 168 hours.  ABG No results for input(s): PHART, PCO2ART, PO2ART in the last 168 hours.  Liver Enzymes No results for input(s): AST, ALT,  ALKPHOS, BILITOT, ALBUMIN in the last 168 hours.  Cardiac Enzymes No results for input(s): TROPONINI, PROBNP in the last 168 hours.  Glucose  Recent Labs Lab 04/12/17 1702  GLUCAP 237*    Imaging No results found.   STUDIES:  9/18 ZHY:QMVH ventricular ejection fraction 55-60%. No wall motion abnormality trivial aortic valve regurg. Severe mitral regurgitation restricted in anterior and posterior mitral valve leaflets mild to moderate tricuspid regurgitation  CULTURES: Blood cultures 2 10/14>>.  ANTIBIOTICS: VANCOMYCIN  10/14>>> Zosyn 10/14>>>  SIGNIFICANT EVENTS:   LINES/TUBES:   DISCUSSION: 66 year old female patient With known history of severe mitral valve regurgitation and diastolic heart failure recently discharged on 10/6 after a prolonged course for decompensated diastolic heart failure. She was diuresed over 26 L with weight dropping from 143 to 123 at time of discharge. Her diuretics were not continued upon time of discharge [name] presents with a 2 to three-day history of progressive respiratory distress, a BNP of over 30,000,and chest x-ray consistent with pulmonary edema. We will admitted with working diagnosis of decompensated diastolic heart failure, pulmonary edema, and possible healthcare associated pneumonia although this seems less likely.  ASSESSMENT / PLAN:   Acute hypoxic respiratory failure in the setting of diffuse pulmonary infiltrates most likely representing acute decompensated diastolic heart failure with pulmonary edema. Also consider healthcare associated pneumonia with evolving ARDS, although seemingly less likely Plan: Admit to the intensive care Supplemental heated high flow oxygen, she is intolerant of noninvasive ventilation due to anxiety and claustrophobia Aggressive diuresis Further preload reduction with nitroglycerin drip and when necessary morphine Total BNP, pro-calcitonin protocol, daily x-ray Empiric vancomycin and Zosyn for now, will discontinue Levaquin given QTc prolongation She would want intubation if she were clinically to decline   Acute on chronic diastolic heart failure with resultant pulmonary edema Severe mitral valve regurgitation, needs surgical valvular repair however to deconditioned and has poor dentition Paroxysmal atrial fibrillation, not on anticoagulation currently due to recent retroperitoneal hematoma History of demand ischemia Coronary artery disease dyslipidemia plan Admit to the intensive care Aggressive diuresis Continue aspirin,  Lipitor Further preload reduction with nitroglycerin infusion as well as when necessary morphine High flow oxygen Cycle cardiac enzymes Will need cardiac reevaluation She has a mildly bradycardia arrhythmia, therefore we'll hold off on the amiodarone, particularly in the concomitant setting of hypoxia  Chronic kidney disease stage IV; most recent creatinine 8.46 Mild metabolic acidosis appears primarily non-anion gap Borderline hyperkalemia Plan Check lactic acid, Bicarbonate replacement, we will do this orally in effort to minimize volume intake Aggressive diuresis  Cardiac support as mentioned above Repeat chemistry  Anemia of critical illness. Current hemoglobin 9.0 this is consistent when she left the hospital last. No evidence of bleeding. We are holding anticoagulation given most recent retroperitoneal bleed  Plan Serial CBCs SCDs to lower extremity transfuse per protocol Continue ferrous sulfate  Hypothyroidism Plan Continue Synthroid  History of diabetes type 2 Plan Continue sliding scale insulin  History of anxiety Plan: Continue BuSpar and Wellbutrin, as well as Cymbalta  FAMILY  - Updates:   - Inter-disciplinary family meet or Palliative Care meeting due by: 10/22  My critical care time 42 minutes  Erick Colace ACNP-BC [place] Pager # [phone] OR # [phone] if no answer   04/12/2017, 5:39 PM

## 2017-04-12 NOTE — Progress Notes (Signed)
Pharmacy Antibiotic Note  Jocelyn Hill. Jocelyn Hill is a 66 y.o. female admitted on 04/12/2017 with pneumonia.  Pharmacy has been consulted for vancomycin and zosyn dosing. Pt is afebrile and WBC was elevated at 14.8 at OSH and Scr was elevated at 3.5 at OSH. Pt received 1500mg  vancomycin and levaquin today prior to transfer.   Plan: Vancomycin 750mg  IV Q48H Zosyn 3.375gm IV x 1 then 2.25gm IV Q8H F/u renal fxn, C&S, clinical status and trough at SS     Temp (24hrs), Avg:98.5 F (36.9 C), Min:98.5 F (36.9 C), Max:98.5 F (36.9 C)  No results for input(s): WBC, CREATININE, LATICACIDVEN, VANCOTROUGH, VANCOPEAK, VANCORANDOM, GENTTROUGH, GENTPEAK, GENTRANDOM, TOBRATROUGH, TOBRAPEAK, TOBRARND, AMIKACINPEAK, AMIKACINTROU, AMIKACIN in the last 168 hours.  Estimated Creatinine Clearance: 15.7 mL/min (A) (by C-G formula based on SCr of 3.18 mg/dL (H)).    Allergies  Allergen Reactions  . Ace Inhibitors Anaphylaxis and Swelling    Antimicrobials this admission: Vanc 10/14>> Zosyn 10/14>> Levaquin x 1 10/14  Dose adjustments this admission: N/A  Microbiology results: Pending  Thank you for allowing pharmacy to be a part of this patient's care.  Jocelyn Hill, Jocelyn Hill 04/12/2017 7:15 PM

## 2017-04-12 NOTE — Progress Notes (Signed)
eLink Physician-Brief Progress Note Patient Name: Jocelyn Hill. Rhodus DOB: Jan 24, 1951 MRN: [id number]   Date of Service  04/12/2017  HPI/Events of Note  Multiple issues: 1. Pink tinged sputum and 2. Vomiting. QTc interval 440 milliseconds.   eICU Interventions  Will order: 1. CBC with platelet count now.  2. Zofran 4 mg IV Q 6 hours PRN N/V. 3. Monitor QTc interval Q 6 hours. Notify MD if QTc interval > 500 milliseconds.      Intervention Category Major Interventions: Other:  Lysle Dingwall 04/12/2017, 9:07 PM

## 2017-04-13 ENCOUNTER — Encounter (HOSPITAL_COMMUNITY): Payer: Self-pay | Admitting: Nephrology

## 2017-04-13 DIAGNOSIS — I340 Nonrheumatic mitral (valve) insufficiency: Secondary | ICD-10-CM

## 2017-04-13 DIAGNOSIS — J810 Acute pulmonary edema: Secondary | ICD-10-CM

## 2017-04-13 DIAGNOSIS — J9601 Acute respiratory failure with hypoxia: Secondary | ICD-10-CM

## 2017-04-13 DIAGNOSIS — N184 Chronic kidney disease, stage 4 (severe): Secondary | ICD-10-CM

## 2017-04-13 LAB — PHOSPHORUS: Phosphorus: 4.5 mg/dL (ref 2.5–4.6)

## 2017-04-13 LAB — BASIC METABOLIC PANEL
Anion gap: 14 (ref 5–15)
BUN: 41 mg/dL — ABNORMAL HIGH (ref 6–20)
CO2: 18 mmol/L — ABNORMAL LOW (ref 22–32)
Calcium: 8.9 mg/dL (ref 8.9–10.3)
Chloride: 100 mmol/L — ABNORMAL LOW (ref 101–111)
Creatinine, Ser: 3.71 mg/dL — ABNORMAL HIGH (ref 0.44–1.00)
GFR calc Af Amer: 14 mL/min — ABNORMAL LOW (ref >60)
GFR calc non Af Amer: 12 mL/min — ABNORMAL LOW (ref >60)
Glucose, Bld: 148 mg/dL — ABNORMAL HIGH (ref 65–99)
Potassium: 4 mmol/L (ref 3.5–5.1)
Sodium: 132 mmol/L — ABNORMAL LOW (ref 135–145)

## 2017-04-13 LAB — CBC WITH DIFFERENTIAL/PLATELET
Basophils Absolute: 0 10*3/uL (ref 0.0–0.1)
Basophils Relative: 0 %
Eosinophils Absolute: 0 10*3/uL (ref 0.0–0.7)
Eosinophils Relative: 0 %
HCT: 24 % — ABNORMAL LOW (ref 36.0–46.0)
Hemoglobin: 8 g/dL — ABNORMAL LOW (ref 12.0–15.0)
Lymphocytes Relative: 2 %
Lymphs Abs: 0.4 10*3/uL — ABNORMAL LOW (ref 0.7–4.0)
MCH: 26.4 pg (ref 26.0–34.0)
MCHC: 33.3 g/dL (ref 30.0–36.0)
MCV: 79.2 fL (ref 78.0–100.0)
Monocytes Absolute: 0.6 10*3/uL (ref 0.1–1.0)
Monocytes Relative: 4 %
Neutro Abs: 15.1 10*3/uL — ABNORMAL HIGH (ref 1.7–7.7)
Neutrophils Relative %: 94 %
Platelets: 240 10*3/uL (ref 150–400)
RBC: 3.03 MIL/uL — ABNORMAL LOW (ref 3.87–5.11)
RDW: 17.7 % — ABNORMAL HIGH (ref 11.5–15.5)
WBC: 16.1 10*3/uL — ABNORMAL HIGH (ref 4.0–10.5)

## 2017-04-13 LAB — GLUCOSE, CAPILLARY
Glucose-Capillary: 159 mg/dL — ABNORMAL HIGH (ref 65–99)
Glucose-Capillary: 159 mg/dL — ABNORMAL HIGH (ref 65–99)
Glucose-Capillary: 171 mg/dL — ABNORMAL HIGH (ref 65–99)
Glucose-Capillary: 149 mg/dL — ABNORMAL HIGH (ref 65–99)
Glucose-Capillary: 144 mg/dL — ABNORMAL HIGH (ref 65–99)
Glucose-Capillary: 121 mg/dL — ABNORMAL HIGH (ref 65–99)

## 2017-04-13 LAB — TROPONIN I
Troponin I: 0.04 ng/mL (ref <0.03)
Troponin I: 0.04 ng/mL (ref <0.03)

## 2017-04-13 LAB — PROCALCITONIN: Procalcitonin: 2.49 ng/mL

## 2017-04-13 LAB — MAGNESIUM: Magnesium: 1.6 mg/dL — ABNORMAL LOW (ref 1.7–2.4)

## 2017-04-13 MED ORDER — ORAL CARE MOUTH RINSE
15.0000 mL | Freq: Two times a day (BID) | OROMUCOSAL | Status: DC
  Administered 2017-04-13 – 2017-04-19 (×11): 15 mL via OROMUCOSAL

## 2017-04-13 MED ORDER — PNEUMOCOCCAL VAC POLYVALENT 25 MCG/0.5ML IJ INJ
0.5000 mL | INJECTION | INTRAMUSCULAR | Status: AC
  Administered 2017-04-14: 0.5 mL via INTRAMUSCULAR
  Filled 2017-04-13: qty 0.5

## 2017-04-13 MED ORDER — WHITE PETROLATUM EX OINT
TOPICAL_OINTMENT | CUTANEOUS | Status: AC
  Administered 2017-04-13: 13:00:00
  Filled 2017-04-13: qty 28.35

## 2017-04-13 MED ORDER — INFLUENZA VAC SPLIT HIGH-DOSE 0.5 ML IM SUSY
0.5000 mL | PREFILLED_SYRINGE | INTRAMUSCULAR | Status: AC
  Administered 2017-04-14: 0.5 mL via INTRAMUSCULAR
  Filled 2017-04-13: qty 0.5

## 2017-04-13 MED ORDER — DOCUSATE SODIUM 100 MG PO CAPS
100.0000 mg | ORAL_CAPSULE | Freq: Two times a day (BID) | ORAL | Status: DC
  Administered 2017-04-13 – 2017-04-22 (×10): 100 mg via ORAL
  Filled 2017-04-13 (×20): qty 1

## 2017-04-13 MED ORDER — BISACODYL 10 MG RE SUPP
10.0000 mg | Freq: Every day | RECTAL | Status: DC | PRN
  Filled 2017-04-13: qty 1

## 2017-04-13 MED ORDER — FUROSEMIDE 10 MG/ML IJ SOLN
160.0000 mg | Freq: Once | INTRAVENOUS | Status: AC
  Administered 2017-04-13: 160 mg via INTRAVENOUS
  Filled 2017-04-13: qty 16

## 2017-04-13 MED ORDER — INSULIN ASPART 100 UNIT/ML ~~LOC~~ SOLN
0.0000 [IU] | Freq: Three times a day (TID) | SUBCUTANEOUS | Status: DC
  Administered 2017-04-13 – 2017-04-14 (×2): 3 [IU] via SUBCUTANEOUS
  Administered 2017-04-14 – 2017-04-15 (×3): 4 [IU] via SUBCUTANEOUS
  Administered 2017-04-15: 3 [IU] via SUBCUTANEOUS
  Administered 2017-04-15 – 2017-04-16 (×2): 4 [IU] via SUBCUTANEOUS
  Administered 2017-04-16 – 2017-04-17 (×3): 3 [IU] via SUBCUTANEOUS
  Administered 2017-04-17: 4 [IU] via SUBCUTANEOUS
  Administered 2017-04-17: 3 [IU] via SUBCUTANEOUS
  Administered 2017-04-18: 7 [IU] via SUBCUTANEOUS
  Administered 2017-04-18 – 2017-04-19 (×2): 3 [IU] via SUBCUTANEOUS
  Administered 2017-04-19 – 2017-04-20 (×2): 4 [IU] via SUBCUTANEOUS
  Administered 2017-04-20: 3 [IU] via SUBCUTANEOUS
  Administered 2017-04-20: 4 [IU] via SUBCUTANEOUS
  Administered 2017-04-21: 7 [IU] via SUBCUTANEOUS
  Administered 2017-04-21 (×2): 3 [IU] via SUBCUTANEOUS
  Administered 2017-04-22: 4 [IU] via SUBCUTANEOUS
  Administered 2017-04-22: 3 [IU] via SUBCUTANEOUS
  Administered 2017-04-22 – 2017-04-23 (×2): 4 [IU] via SUBCUTANEOUS

## 2017-04-13 MED ORDER — DEXTROSE 5 % IV SOLN
160.0000 mg | Freq: Three times a day (TID) | INTRAVENOUS | Status: DC
  Administered 2017-04-13 – 2017-04-18 (×15): 160 mg via INTRAVENOUS
  Filled 2017-04-13 (×14): qty 16
  Filled 2017-04-13: qty 10
  Filled 2017-04-13: qty 14
  Filled 2017-04-13: qty 16

## 2017-04-13 MED ORDER — NITROGLYCERIN 0.4 MG SL SUBL
0.4000 mg | SUBLINGUAL_TABLET | SUBLINGUAL | Status: DC | PRN
  Administered 2017-04-18 (×4): 0.4 mg via SUBLINGUAL
  Filled 2017-04-13: qty 1

## 2017-04-13 MED ORDER — DOCUSATE SODIUM 100 MG PO CAPS
100.0000 mg | ORAL_CAPSULE | Freq: Every day | ORAL | Status: DC | PRN
  Filled 2017-04-13: qty 1

## 2017-04-13 NOTE — Progress Notes (Signed)
CSW acknowledges consult for arrangement of POA. Please consult chaplain for [place]. CSW signing off.     Virgie Dad Revanth Neidig, MSW, [place] [facility] Clinical Social Worker [phone]

## 2017-04-13 NOTE — Progress Notes (Signed)
eLink Physician-Brief Progress Note Patient Name: Jocelyn Hill. Even DOB: Nov 16, 1950 MRN: [id number]   Date of Service  04/13/2017  HPI/Events of Note  Chest pain - EKG reveals junctional rhythm with anterior infarct, age undetermined. Troponin = 0.04 --> 0.04. Pain almost totally relieved with Morphine IV. Pain has gone from 6/10 to 3/10. Already on ASA. With HR = 55, no room to B-Block. Had episode of pink tinged sputum earlier, therefore, reluctant to Heparinize.   eICU Interventions  Will order: 1. Nitroglycerin 0.4 mg SL PRN chest pain. 2. Titrate Nitroglycerin IV infusion up as BP tolerates.  3. Continue to cycle Troponin.     Intervention Category Major Interventions: Other:  Shantinique Picazo Cornelia Copa 04/13/2017, 2:38 AM

## 2017-04-13 NOTE — Progress Notes (Signed)
[place] / CRITICAL CARE MEDICINE   Name: Jocelyn Hill. Jocelyn Hill MRN: [id number] DOB: 1951/01/11    ADMISSION DATE:  04/12/2017 CONSULTATION DATE:10/14  REFERRING MD:  Odie Sera   CHIEF COMPLAINT:  Acute respiratory failure  HISTORY OF PRESENT ILLNESS:   This is 66 year old female with a significant recent medical history of diastolic heart failure severe mitral valve disease felt due to underlying rheumatic heart disease, stage IV CK D  she left the hospital recently with a creatinine 28f 3.18, paroxysmal atrial fibrillation, recent Escherichia coli bacteremia with pyelonephritis, diabetes, and recent retroperitoneal hematoma. She was discharged on 10/6. During this hospitalization she was diuresed over 26 L her weight went from 143 pounds on admission down to 123 she was deemed euvolemic upon time of discharge. Following discharge she was discharged to a skilled nursing facility with the plan to receive aggressive rehabilitation, and eventually be seen by dental medicine for dental extractions in hopes of eventually undergoing mitral valve surgery should she recover to the point thoracic surgery team for a potential candidate. Her diuretics were stopped upon time of admission to the nursing home, possibly 3 days prior to presentation at [facility] emergency rooms he began to notice increased shortness of breath, a cough with some blood in her sputum, increased weakness, increased swelling, and her lower extremities as well as increase in frequency of chest pain primarily substernal. In the emergency room she was found to have saturations in the 70s chest x-ray consistent with pulmonary edema per report 12-lead showing atrial fibrillation with prolonged QT as well as BNP of over 30,000. Given her prolonged recent hospital stay, and multiple comorbidities as well as prior workup done here, on admission to our intensive care was requested.  SUBJECTIVE:  Overnight patient with chest pain, and EKG suggestive of  anterior wall ischemia - given morphine and nitro. She also had vomiting and blood-tinged sputum, for which she was given zofran and QTc was monitored closely.   VITAL SIGNS: BP (!) 141/66   Pulse (!) 56   Temp 98 F (36.7 C) (Oral)   Resp 18   SpO2 91%  100% nonrebreather HEMODYNAMICS:    VENTILATOR SETTINGS: FiO2 (%):  [90 %-100 %] 90 %  INTAKE / OUTPUT: I/O last 3 completed shifts: In: -  Out: 100 [Urine:100]  PHYSICAL EXAMINATION: General:   Chronically-ill appearing female in NAD, oxygen by Sparks in place Neuro:   Awake, alert, responds to questions appropriately HEENT:  NCAT, MMM, EOMI Cardiovascular:  Irregularly irregular, no murmer Lungs:  CTA anteriorly, comfortable work of breathing this AM, speaking in full sentances Abdomen:  Soft, nt, nd Skin:  Warm and dry compression stockings in place  LABS:  BMET  Recent Labs Lab 04/12/17 1924  NA 130*  K 4.1  CL 100*  CO2 19*  BUN 40*  CREATININE 3.75*  GLUCOSE 176*    Electrolytes  Recent Labs Lab 04/12/17 1924  CALCIUM 9.1  MG 1.6*  PHOS 4.0    CBC  Recent Labs Lab 04/13/17 0106  WBC 16.1*  HGB 8.0*  HCT 24.0*  PLT 240    Coag's  Recent Labs Lab 04/12/17 1924  APTT 35  INR 1.46    Sepsis Markers  Recent Labs Lab 04/12/17 1924  PROCALCITON 2.06    ABG No results for input(s): PHART, PCO2ART, PO2ART in the last 168 hours.  Liver Enzymes  Recent Labs Lab 04/12/17 1924  AST 29  ALT 33  ALKPHOS 107  BILITOT 1.3*  ALBUMIN 2.4*  Cardiac Enzymes  Recent Labs Lab 04/12/17 1924 04/13/17 0106  TROPONINI 0.04* 0.04*    Glucose  Recent Labs Lab 04/12/17 1702 04/12/17 2332  GLUCAP 237* 203*    Imaging Dg Chest Port 1 View  Result Date: 04/12/2017 CLINICAL DATA:  Pneumonia versus pulmonary edema EXAM: PORTABLE CHEST 1 VIEW COMPARISON:  04/12/2017 FINDINGS: Enlarged cardiac silhouette. Diffuse fine airspace disease is similar comparison exam. Airspace disease  slightly less dense. Lungs are hyperinflated. No pleural fluid. IMPRESSION: Mild improvement in diffuse interstitial and fine pulmonary edema pattern. Stable cardiomegaly. Electronically Signed   By: Suzy Bouchard M.D.   On: 04/12/2017 18:57    STUDIES:  9/18 CHE:NIDP ventricular ejection fraction 55-60%. No wall motion abnormality trivial aortic valve regurg. Severe mitral regurgitation restricted in anterior and posterior mitral valve leaflets mild to moderate tricuspid regurgitation  CULTURES: Blood cultures 2 10/14>>.  ANTIBIOTICS: VANCOMYCIN 10/14>>> Zosyn 10/14>>>  SIGNIFICANT EVENTS:   LINES/TUBES:   DISCUSSION: 66 year old female patient With known history of severe mitral valve regurgitation and diastolic heart failure recently discharged on 10/6 after a prolonged course for decompensated diastolic heart failure. She was diuresed over 26 L with weight dropping from 143 to 123 at time of discharge. Her diuretics were not continued upon time of discharge [name] presents with a 2 to three-day history of progressive respiratory distress, a BNP of over 30,000,and chest x-ray consistent with pulmonary edema. We will admitted with working diagnosis of decompensated diastolic heart failure, pulmonary edema, and possible healthcare associated pneumonia although this seems less likely.  ASSESSMENT / PLAN: PULM Acute hypoxic respiratory failure in the setting of diffuse pulmonary infiltrates most likely representing acute decompensated diastolic heart failure with pulmonary edema. Also consider healthcare associated pneumonia with evolving ARDS, although seemingly less likely Plan: Supplemental heated high flow oxygen, she is intolerant of noninvasive ventilation due to anxiety and claustrophobia Aggressive diuresis - lasix 80 mg q8H Further preload reduction with nitroglycerin drip and when necessary morphine Empiric vancomycin and Zosyn for now (Levaquin given QTc  prolongation)  CARDIAC Acute on chronic diastolic heart failure with resultant pulmonary edema Severe mitral valve regurgitation, needs surgical valvular repair however to deconditioned and has poor dentition Paroxysmal atrial fibrillation, not on anticoagulation currently due to recent retroperitoneal hematoma History of demand ischemia Coronary artery disease Dyslipidemia Chest pain/EKG with anterior wall ischemis plan Aggressive diuresis Continue aspirin, Lipitor Further preload reduction with nitroglycerin infusion as well as when necessary morphine High flow oxygen Cycle cardiac enzymes - so far flat at 0.04 Will need cardiac reevaluation She has a mildly bradycardia arrhythmia, therefore we'll hold off on the amiodarone, particularly in the concomitant setting of hypoxia  RENAL Chronic kidney disease stage IV; most recent creatinine 8.24 Mild metabolic acidosis appears primarily non-anion gap Borderline hyperkalemia Plan Bicarbonate replacement, we will do this orally in effort to minimize volume intake Aggressive diuresis  Cardiac support as mentioned above AM BMP pending  HEME Anemia of critical illness. Current hemoglobin 9.0 this is consistent when she left the hospital last. No evidence of bleeding. We are holding anticoagulation given most recent retroperitoneal bleed  Plan Serial CBCs SCDs to lower extremity transfuse per protocol Continue ferrous sulfate  ENDO Hypothyroidism Plan Continue Synthroid  History of diabetes type 2 CBG's elevated overnight 237-203 Plan Continue sliding scale insulin>>consider resistant sliding scale  NEURO History of anxiety Plan: Continue BuSpar and Wellbutrin, as well as Cymbalta  FAMILY  - Updates:   - Inter-disciplinary family meet or Palliative Care meeting due  by: 10/22  Everrett Coombe, MD PGY-2 [facility]

## 2017-04-13 NOTE — Consult Note (Signed)
Advanced Heart Failure Team Consult Note   Primary Physician:' HF Cardiologist: Dr Aundra Dubin Primary Cardiologist: Dr Bettina Gavia  Reason for Consultation: Heart Failure   HPI:    Jocelyn Hill is seen today for evaluation of heart failure at the request of Dr Vaughan Browner.   Jocelyn Hill is a 66 year old with history of severe rheumatic mitral regurgitation, CAD, HTN, DM, hypothyroidism, A fib,, CKD, r hypdronephrosis, and retroperitoneal hematoma.   Most recent hospitalization 9/13 with acute hypoxic respiratory failure. ECHO showed normal LV function with severe MR. Diuresed with IV lasix over 20 pounds. She was being considered for MVR by Dr Roxy Manns but due to multiple complications she was not a candidate. Hospital course was complicated by acute respiratory failure, acute renal failure, A Fib, retroperitoneal bleed, r hydronephrosis (perc tube) and e- coli bacteremia. Creatinine peaked at 5.She did not require dialysis and gradually renal function improved.Discharged to SNF She was not on diuretics. Discharge weight was 124 pounds.    Yesterday she was admitted with respiratory failure from [facility]. She had gained 16 pounds at [facility].  CXR with bilateral infiltrates. Placed on vanc and zosyn. Pertinent admission labs included BNP > 3000, creatinine 3.75, K 4.1, WBC 16.1, procalcitonin 2.06, and troponin 0.04. Requiring 100%  high flow oxygen. Blood CX - NGTD. Diuresing with high dose IV lasix. Urine output improving with 160 mg IV lasix. Nephrology consulted. Also placed on NTG drip for HTN and CP.   TEE 03/17/2017  - Left ventricle: Systolic function was normal. The estimated   ejection fraction was in the range of 55% to 60%. Wall motion was   normal; there were no regional wall motion abnormalities. - Aortic valve: No evidence of vegetation. There was trivial   regurgitation. - Mitral valve: Mobility of the anterior and posterior leaflet was   severely restricted. There was severe  regurgitation. - Left atrium: The atrium was mildly dilated. No evidence of   thrombus in the atrial cavity or appendage. - Right atrium: No evidence of thrombus in the atrial cavity or   appendage. - Atrial septum: No defect or patent foramen ovale was identified. - Tricuspid valve: No evidence of vegetation. There was   mild-moderate regurgitation. - Pulmonic valve: No evidence of vegetation. Impressions: - Normal LV systolic function; mild LAE; restricted anterior and   posterior MV leaflets with severe MR; mild to moderate TR.  Review of Systems: [y] = yes, [ ]  = no   General: Weight gain [Y ]; Weight loss [ ] ; Anorexia [ ] ; Fatigue [Y ]; Fever [ ] ; Chills [ ] ; Weakness [ ]   Cardiac: Chest pain/pressure [ ] ; Resting SOB [Y ]; Exertional SOB [Y ]; Orthopnea [ ] ; Pedal Edema [T ]; Palpitations [ ] ; Syncope [ ] ; Presyncope [ ] ; Paroxysmal nocturnal dyspnea[ ]   Pulmonary: Cough [ ] ; Wheezing[ ] ; Hemoptysis[ ] ; Sputum [ ] ; Snoring [ ]   GI: Vomiting[ ] ; Dysphagia[ ] ; Melena[ ] ; Hematochezia [ ] ; Heartburn[ ] ; Abdominal pain [ ] ; Constipation [ ] ; Diarrhea [ ] ; BRBPR [ ]   GU: Hematuria[ ] ; Dysuria [ ] ; Nocturia[ ]   Vascular: Pain in legs with walking [ ] ; Pain in feet with lying flat [ ] ; Non-healing sores [ ] ; Stroke [ ] ; TIA [ ] ; Slurred speech [ ] ;  Neuro: Headaches[ ] ; Vertigo[ ] ; Seizures[ ] ; Paresthesias[ ] ;Blurred vision [ ] ; Diplopia [ ] ; Vision changes [ ]   Ortho/Skin: Arthritis [ ] ; Joint pain [Y ]; Muscle pain [ ] ; Joint  swelling [ ] ; Back Pain [ ] ; Rash [ ]   Psych: Depression[ ] ; Anxiety[ ]   Heme: Bleeding problems [ ] ; Clotting disorders [ ] ; Anemia [ ]   Endocrine: Diabetes [ Y]; Thyroid dysfunction[ ]   Home Medications Prior to Admission medications   Medication Sig Start Date End Date Taking? Authorizing Provider  acetaminophen (TYLENOL) 500 MG tablet Take 500 mg by mouth every 8 (eight) hours as needed (pain).   Yes [provider]  albuterol (PROVENTIL) (2.5  MG/3ML) 0.083% nebulizer solution Take 2.5 mg by nebulization every 6 (six) hours as needed for shortness of breath.   Yes [provider]  amiodarone (PACERONE) 100 MG tablet Take 2 tablets (200 mg total) by mouth daily. 04/05/17  Yes Caren Griffins, MD  amLODipine (NORVASC) 10 MG tablet Take 10 mg by mouth daily.   Yes [provider]  amoxicillin-clavulanate (AUGMENTIN) 875-125 MG tablet Take 1 tablet by mouth 2 (two) times daily. For 7 more days Patient taking differently: Take 1 tablet by mouth 2 (two) times daily. 7 day course started 04/05/17 pm 04/04/17  Yes Caren Griffins, MD  aspirin EC 81 MG tablet Take 81 mg by mouth daily.   Yes [provider]  atorvastatin (LIPITOR) 40 MG tablet Take 40 mg by mouth at bedtime.    Yes [provider]  b complex vitamins tablet Take 1 tablet by mouth daily.   Yes [provider]  buPROPion (WELLBUTRIN XL) 300 MG 24 hr tablet Take 300 mg by mouth daily.   Yes [provider]  busPIRone (BUSPAR) 15 MG tablet Take 15 mg by mouth 2 (two) times daily.   Yes [provider]  cholecalciferol (VITAMIN D) 1000 units tablet Take 1,000 Units by mouth daily.   Yes [provider]  DULoxetine (CYMBALTA) 30 MG capsule Take 30 mg by mouth daily.   Yes [provider]  ferrous sulfate 325 (65 FE) MG tablet Take 1 tablet (325 mg total) by mouth 2 (two) times daily with a meal. 04/04/17  Yes Gherghe, Vella Redhead, MD  furosemide (LASIX) 20 MG tablet Take 20 mg by mouth daily.   Yes [provider]  gabapentin (NEURONTIN) 300 MG capsule Take 300 mg by mouth at bedtime.   Yes [provider]  hydrALAZINE (APRESOLINE) 25 MG tablet Take 3 tablets (75 mg total) by mouth every 8 (eight) hours. 04/04/17  Yes Caren Griffins, MD  hydrOXYzine (ATARAX/VISTARIL) 50 MG tablet Take 50 mg by mouth every 6 (six) hours as needed for anxiety.   Yes [provider]  Insulin Glargine  (BASAGLAR KWIKPEN) 100 UNIT/ML SOPN Inject 4 Units into the skin daily.   Yes [provider]  levofloxacin (LEVAQUIN) 500 MG tablet Take 500 mg by mouth every evening. 5 day course started 04/08/17   Yes [provider]  levothyroxine (SYNTHROID, LEVOTHROID) 112 MCG tablet Take 112 mcg by mouth daily before breakfast.   Yes [provider]  loperamide (IMODIUM A-D) 2 MG tablet Take 2 mg by mouth every 4 (four) hours as needed for diarrhea or loose stools (do not exceed 4 tablets in 24 hours).   Yes [provider]  montelukast (SINGULAIR) 10 MG tablet Take 10 mg by mouth daily.   Yes [provider]  nitroGLYCERIN (NITROSTAT) 0.4 MG SL tablet Place 0.4 mg under the tongue every 5 (five) minutes as needed for chest pain (max 3 doses).   Yes [provider]  ondansetron (ZOFRAN-ODT)  4 MG disintegrating tablet Take 4 mg by mouth every 6 (six) hours as needed for nausea or vomiting.   Yes [provider]  oxyCODONE (OXY IR/ROXICODONE) 5 MG immediate release tablet Take 5 mg by mouth every 8 (eight) hours as needed (pain).   Yes [provider]  OXYGEN Inhale 2-4 L into the lungs as needed (for shortness of breath/ to keep oxygen levels above 93%).   Yes [provider]  pantoprazole (PROTONIX) 40 MG tablet Take 40 mg by mouth daily.   Yes [provider]  polyethylene glycol (MIRALAX / GLYCOLAX) packet Take 17 g by mouth daily as needed (constipation).   Yes [provider]  pregabalin (LYRICA) 150 MG capsule Take 150 mg by mouth daily.   Yes [provider]  Probiotic Product (PROBIOTIC PO) Take 1 capsule by mouth daily.   Yes [provider]  sertraline (ZOLOFT) 25 MG tablet Take 25 mg by mouth at bedtime.   Yes [provider]  tiZANidine (ZANAFLEX) 4 MG tablet Take 4 mg by mouth 3 (three) times daily.   Yes [provider]  insulin glargine (LANTUS) 100 UNIT/ML  injection Inject 0.04 mLs (4 Units total) into the skin daily. Patient not taking: Reported on 04/12/2017 04/05/17   Caren Griffins, MD  Oxycodone HCl 10 MG TABS Take 0.5 tablets (5 mg total) by mouth 3 (three) times daily as needed (pain). Patient not taking: Reported on 04/12/2017 04/04/17   Caren Griffins, MD    Past Medical History: Past Medical History:  Diagnosis Date  . Anxiety   . Arthritis   . Bronchitis   . Depression   . Diabetes mellitus without complication ([place])   . Diet-controlled diabetes mellitus ([place])   . GERD (gastroesophageal reflux disease)   . Hyperlipidemia   . Hypertension   . Hypothyroidism   . Stroke [facility])     Past Surgical History: Past Surgical History:  Procedure Laterality Date  . ABDOMINAL HYSTERECTOMY     PARTIALS  . IR NEPHRO TUBE REMOV/FL  04/02/2017  . IR NEPHROSTOGRAM RIGHT THRU EXISTING ACCESS  04/02/2017  . IR NEPHROSTOMY PLACEMENT RIGHT  03/27/2017  . IR THORACENTESIS ASP PLEURAL SPACE W/IMG GUIDE  03/24/2017  . TONSILLECTOMY    . TUBAL LIGATION      Family History: No family history on file.  Social History: Social History   Social History  . Marital status: Divorced    Spouse name: N/A  . Number of children: N/A  . Years of education: N/A   Social History Main Topics  . Smoking status: Current Some Day Smoker    Types: Cigarettes  . Smokeless tobacco: Never Used  . Alcohol use No  . Drug use: No  . Sexual activity: No   Other Topics Concern  . Not on file   Social History Narrative  . No narrative on file    Allergies:  Allergies  Allergen Reactions  . Ace Inhibitors Anaphylaxis and Swelling    Objective:    Vital Signs:   Temp:  [98 F (36.7 C)-98.5 F (36.9 C)] 98.1 F (36.7 C) (10/15 1110) Pulse Rate:  [38-68] 57 (10/15 0900) Resp:  [16-30] 20 (10/15 0900) BP: (136-187)/(64-96) 152/69 (10/15 0900) SpO2:  [79 %-100 %] 97 % (10/15 0900) FiO2 (%):  [90 %-100 %] 90 % (10/15 0732) Weight:  [139 lb  8.8 oz (63.3 kg)] 139 lb 8.8 oz (63.3 kg) (10/15 0500) Last BM Date: 04/09/17  Weight  change: Filed Weights   04/13/17 0500  Weight: 139 lb 8.8 oz (63.3 kg)    Intake/Output:   Intake/Output Summary (Last 24 hours) at 04/13/17 1442 Last data filed at 04/13/17 1424  Gross per 24 hour  Intake            542.1 ml  Output             1360 ml  Net           -817.9 ml      Physical Exam    General:  Dyspneic at rest. In bed. HEENT: normal Neck: supple. JVP to to jaw. Carotids 2+ bilat; no bruits. No lymphadenopathy or thyromegaly appreciated. Cor: PMI nondisplaced. Regular rate & rhythm. 3/6 MR at apex Lungs: Decreased on 100% crackles throughout Abdomen: soft, nontender, + distended. No hepatosplenomegaly. No bruits or masses. Good bowel sounds. Extremities: no cyanosis, clubbing, rash, R and LLE ted hose 1-2+ edema Neuro: alert & orientedx3, cranial nerves grossly intact. moves all 4 extremities w/o difficulty. Affect pleasant GU: Foley   Telemetry   Junctional rhythm with retrograde conduction 50-60s. Personally reviewed    EKG    JunctionalRhythm59 bpm Personally reviewed   Labs   Basic Metabolic Panel:  Recent Labs Lab 04/12/17 1924 04/13/17 0634 04/13/17 1206  NA 130* 132*  --   K 4.1 4.0  --   CL 100* 100*  --   CO2 19* 18*  --   GLUCOSE 176* 148*  --   BUN 40* 41*  --   CREATININE 3.75* 3.71*  --   CALCIUM 9.1 8.9  --   MG 1.6*  --  1.6*  PHOS 4.0  --  4.5    Liver Function Tests:  Recent Labs Lab 04/12/17 1924  AST 29  ALT 33  ALKPHOS 107  BILITOT 1.3*  PROT 5.8*  ALBUMIN 2.4*   No results for input(s): LIPASE, AMYLASE in the last 168 hours. No results for input(s): AMMONIA in the last 168 hours.  CBC:  Recent Labs Lab 04/13/17 0106  WBC 16.1*  NEUTROABS 15.1*  HGB 8.0*  HCT 24.0*  MCV 79.2  PLT 240    Cardiac Enzymes:  Recent Labs Lab 04/12/17 1924 04/13/17 0106 04/13/17 0634  TROPONINI 0.04* 0.04* 0.04*     BNP: BNP (last 3 results)  Recent Labs  03/12/17 1400  BNP 2,230.5*    ProBNP (last 3 results) No results for input(s): PROBNP in the last 8760 hours.   CBG:  Recent Labs Lab 04/12/17 1702 04/12/17 2332 04/13/17 0723 04/13/17 1106  GLUCAP 237* 203* 159* 159*    Coagulation Studies:  Recent Labs  04/12/17 1924  LABPROT 17.6*  INR 1.46     Imaging   Dg Chest Port 1 View  Result Date: 04/12/2017 CLINICAL DATA:  Pneumonia versus pulmonary edema EXAM: PORTABLE CHEST 1 VIEW COMPARISON:  04/12/2017 FINDINGS: Enlarged cardiac silhouette. Diffuse fine airspace disease is similar comparison exam. Airspace disease slightly less dense. Lungs are hyperinflated. No pleural fluid. IMPRESSION: Mild improvement in diffuse interstitial and fine pulmonary edema pattern. Stable cardiomegaly. Electronically Signed   By: Suzy Bouchard M.D.   On: 04/12/2017 18:57      Medications:     Current Medications: . aspirin EC  81 mg Oral Daily  . atorvastatin  40 mg Oral Daily  . buPROPion  300 mg Oral Daily  . busPIRone  15 mg Oral BID  . cholecalciferol  1,000 Units Oral Daily  .  DULoxetine  30 mg Oral Daily  . ferrous sulfate  325 mg Oral BID WC  . hydrALAZINE  75 mg Oral Q8H  . insulin aspart  0-20 Units Subcutaneous TID WC  . insulin aspart  0-5 Units Subcutaneous QHS  . levothyroxine  112 mcg Oral QAC breakfast  . mouth rinse  15 mL Mouth Rinse BID  . montelukast  10 mg Oral Daily  . pantoprazole  40 mg Oral Daily  . sodium bicarbonate  650 mg Oral BID     Infusions: . sodium chloride 10 mL/hr at 04/12/17 1939  . nitroGLYCERIN 40 mcg/min (04/13/17 0057)  . piperacillin-tazobactam (ZOSYN)  IV Stopped (04/13/17 1322)  . [START ON 04/14/2017] vancomycin         Patient Profile   Jocelyn Kishi is a 66 year old with history of MR underlying rheumatic disease, CAD, HTN, DM, hypothyroidism, A fib, severe mitral valve disease, CKD, r hypdronephrosis, and  retroperitoneal hematoma.MVR surgery was deferred last admit due to multiple complications.   Admitted with respiratory failure and volume overload.  Assessment/Plan   1. Acute Hypoxic Respiratory Failure On HFNC 100% oxygen. Per CCM trying to keep sats > 85%.  2.A/C Diastolic Heart Failure-ECHO 02/2017 EF 60-65% .  -Volume overloaded. Weight up over 15 pounds. Poor response to 80 mg IV lasix. Lasix increased to 160 mg twice daily,  -Urine output picking up.  3.A/C CKD Stage IV - Creatinine baseline ~3.3. Nephrology Consulted.  Creatinine 3.7 today.  IV lasix increased to 160- mg twice a day. - Had R hydronephrosis last month and required perc drain. -Renal US ordered  4. HTN- on nitro drip +hydralazine 75 mg tid.  5.Severe MV Disease- noted on TEE 02/2017 .Not a candidate for MVR per Dr Roxy Manns given multiple complications during recent admit. 6. H/O retroperitoneal bleed-02/2017- off anticoagulants.  7.PAF- 02/2017 converted to NSR on amio. Developed RP so anticoagulants stopped. Continue ted hose+ SCDs forDVT prophylaxis.Marland Kitchen 8. CAD- S/P MI x2-2012-2013. Last admit LHC was considered but due to worsening renal failure this was deferred. 9. Junctional rhythm  - hold amio for now   Length of Stay: 1  Amy Clegg, NP  04/13/2017, 2:42 PM  Advanced Heart Failure Team Pager [phone] (M-F; [place])  Please contact [facility] for night-coverage after hours (4p -7a ) and weekends on [email or web address]  Agree with above.   66 y/o woman as above s/p with severe rheumatic MR and multiple comorbidities. Recently discharged to SNF after prolonged hospitalization. During that hospitalization was evaluated for MVR but felt to be too sick for cath or surgery. D/c to SNF off diuretics and subsequently gained 16 pounds.   Now readmitted with respiratory failure and CKD 4. Poor response to moderate-dose IV lasix. Now responding to high-dose IV lasix. Also in junctional rhythm on exam with retrograde p waves.    On exam ill appearing and frail on high-flow O2 with marginal sats JVP to ear Cor RRR 3/6 MR Lungs with diffuse crackles Ab distended NT Ext: warm with 2+ edema Neuro status fully intact  She is critically ill with multi-system organ failure. Agree with high-dose lasix for now. Wean NTG as tolerate. Need to AVOID hypotension. Currently remains too infirm for consideration of MV surgery. If she improves suspect she may need HD prior to cath and MV surgery. We will continue to follow. No AC for AF due to recent RP bleed. Would hold amio for now with junctional rhythm.   CRITICAL CARE Performed  by: Glori Bickers  Total critical care time: 35 minutes  Critical care time was exclusive of separately billable procedures and treating other patients.  Critical care was necessary to treat or prevent imminent or life-threatening deterioration.  Critical care was time spent personally by me (independent of midlevel providers or residents) on the following activities: development of treatment plan with patient and/or surrogate as well as nursing, discussions with consultants, evaluation of patient's response to treatment, examination of patient, obtaining history from patient or surrogate, ordering and performing treatments and interventions, ordering and review of laboratory studies, ordering and review of radiographic studies, pulse oximetry and re-evaluation of patient's condition.   Glori Bickers, MD  10:03 PM

## 2017-04-13 NOTE — Progress Notes (Signed)
eLink Physician-Brief Progress Note Patient Name: Jocelyn Hill DOB: December 19, 1950 MRN: [id number]   Date of Service  04/13/2017  HPI/Events of Note  Constipation  eICU Interventions  Will order: 1. Colace 100 mg PO BID. 2. Dulcolax Suppository 10 mg PR Q day PRN constipation.         Simone Tuckey Cornelia Copa 04/13/2017, 10:57 PM

## 2017-04-13 NOTE — Consult Note (Signed)
Reason for Consult: acute on chronic renal failure with oliguria. Referring Physician: Dr Jocelyn Hill is an 66 y.o. female.with PMHx significant for diabetes, hypertension,CK D stage IV,severe MR, CHF, recent hospitalization with acute respiratory failure, Escherichia coli bacteremia secondary to pyelonephritis,obstructive nephropathy due to right-sided retro peritoneal hematoma secondary to anticoagulation, Was admitted to [facility] because of worsening shortness of breath, hypoxia, edema and intermittent chest pain.  Patient was recently discharged on 04/04/2017 to skilled nursing facility, it was decided to discontinue her diuretics because of worsening creatinine (she was evaluated by Dr. Lorrene Reid who felt that she was intravascularly volume depleted due to aggressive diuresis during that admission but to watch her I's/O's and daily weights closely to determine future need for diuretics.  Unfortunately she was not monitored at the SNF and gained 16lbs since discharge and was readmitted with decompensated CHF). During that hospitalization she had a new diagnosis of CHF, diuresed aggressively with a net of -26 L and weight loss of about 20 pounds. Her discharge weight was 123 pounds, on admission her weight was 139 pounds. On discharge her creatinine was 3.18, on readmission it was 3.75, today it was 3.71. Patient was oliguric, with very little response to Lasix 80 mg IV every 8 hourly.she also has bilateral infiltrate on chest x-ray and BNP >3000.  And seen today she was still short of breath, saturating in the high 80s with high flow nasal cannula. She denies any nausea vomiting. She does complain of loss of appetite. Patient was unable to answer questions  regarding urinary output. According to patient she started Feeling worse after 2 days in skilled nursing facility, and because of gradually worsening of her symptoms she was sent to [facility], from where she was transferred  to [facility].  Her trend in Scr is seen below.  Trend in Creatinine: Creatinine, Ser  Date/Time Value Ref Range Status  04/13/2017 06:34 AM 3.71 (H) 0.44 - 1.00 mg/dL Final  04/12/2017 07:24 PM 3.75 (H) 0.44 - 1.00 mg/dL Final  04/04/2017 02:46 AM 3.18 (H) 0.44 - 1.00 mg/dL Final  04/03/2017 04:01 AM 3.72 (H) 0.44 - 1.00 mg/dL Final  04/02/2017 03:52 AM 3.54 (H) 0.44 - 1.00 mg/dL Final  04/01/2017 03:59 AM 3.62 (H) 0.44 - 1.00 mg/dL Final  03/31/2017 04:16 AM 4.11 (H) 0.44 - 1.00 mg/dL Final  03/30/2017 06:03 AM 4.36 (H) 0.44 - 1.00 mg/dL Final  03/29/2017 11:56 AM 4.64 (H) 0.44 - 1.00 mg/dL Final  03/28/2017 11:30 PM 5.00 (H) 0.44 - 1.00 mg/dL Final  03/28/2017 05:48 AM 4.92 (H) 0.44 - 1.00 mg/dL Final  03/27/2017 06:23 PM 4.65 (H) 0.44 - 1.00 mg/dL Final  03/27/2017 03:38 AM 4.24 (H) 0.44 - 1.00 mg/dL Final  03/26/2017 03:00 PM 3.61 (H) 0.44 - 1.00 mg/dL Final  03/26/2017 09:53 AM 3.62 (H) 0.44 - 1.00 mg/dL Final  03/26/2017 04:24 AM 3.26 (H) 0.44 - 1.00 mg/dL Final  03/25/2017 04:33 AM 2.77 (H) 0.44 - 1.00 mg/dL Final  03/24/2017 04:34 AM 2.73 (H) 0.44 - 1.00 mg/dL Final  03/23/2017 05:46 AM 2.62 (H) 0.44 - 1.00 mg/dL Final  03/21/2017 04:44 AM 3.00 (H) 0.44 - 1.00 mg/dL Final  03/20/2017 04:18 AM 3.56 (H) 0.44 - 1.00 mg/dL Final  03/19/2017 07:30 PM 3.57 (H) 0.44 - 1.00 mg/dL Final  03/19/2017 03:16 AM 3.51 (H) 0.44 - 1.00 mg/dL Final  03/18/2017 03:46 AM 4.47 (H) 0.44 - 1.00 mg/dL Final  03/17/2017 03:24 AM 5.00 (H) 0.44 -  1.00 mg/dL Final  03/16/2017 02:03 PM 5.12 (H) 0.44 - 1.00 mg/dL Final  03/16/2017 07:58 AM 4.83 (H) 0.44 - 1.00 mg/dL Final  03/15/2017 02:22 AM 3.53 (H) 0.44 - 1.00 mg/dL Final  03/14/2017 03:20 AM 3.52 (H) 0.44 - 1.00 mg/dL Final  03/13/2017 02:02 AM 3.15 (H) 0.44 - 1.00 mg/dL Final  03/12/2017 02:00 PM 3.29 (H) 0.44 - 1.00 mg/dL Final    PMH:   Past Medical History:  Diagnosis Date  . Anxiety   . Arthritis   . Bronchitis   . CKD (chronic  kidney disease) stage 4, GFR 15-29 ml/min (HCC) 03/16/2017  . Depression   . Diabetes mellitus without complication ([place])   . Diet-controlled diabetes mellitus ([place])   . GERD (gastroesophageal reflux disease)   . Hyperlipidemia   . Hypertension   . Hypothyroidism   . Stroke Jocelyn Hill)     PSH:   Past Surgical History:  Procedure Laterality Date  . ABDOMINAL HYSTERECTOMY     PARTIALS  . IR NEPHRO TUBE REMOV/FL  04/02/2017  . IR NEPHROSTOGRAM RIGHT THRU EXISTING ACCESS  04/02/2017  . IR NEPHROSTOMY PLACEMENT RIGHT  03/27/2017  . IR THORACENTESIS ASP PLEURAL SPACE W/IMG GUIDE  03/24/2017  . TONSILLECTOMY    . TUBAL LIGATION      Allergies:  Allergies  Allergen Reactions  . Ace Inhibitors Anaphylaxis and Swelling    Medications:   Prior to Admission medications   Medication Sig Start Date End Date Taking? Authorizing Provider  acetaminophen (TYLENOL) 500 MG tablet Take 500 mg by mouth every 8 (eight) hours as needed (pain).   Yes [provider]  albuterol (PROVENTIL) (2.5 MG/3ML) 0.083% nebulizer solution Take 2.5 mg by nebulization every 6 (six) hours as needed for shortness of breath.   Yes [provider]  amiodarone (PACERONE) 100 MG tablet Take 2 tablets (200 mg total) by mouth daily. 04/05/17  Yes Caren Griffins, MD  amLODipine (NORVASC) 10 MG tablet Take 10 mg by mouth daily.   Yes [provider]  amoxicillin-clavulanate (AUGMENTIN) 875-125 MG tablet Take 1 tablet by mouth 2 (two) times daily. For 7 more days Patient taking differently: Take 1 tablet by mouth 2 (two) times daily. 7 day course started 04/05/17 pm 04/04/17  Yes Caren Griffins, MD  aspirin EC 81 MG tablet Take 81 mg by mouth daily.   Yes [provider]  atorvastatin (LIPITOR) 40 MG tablet Take 40 mg by mouth at bedtime.    Yes [provider]  b complex vitamins tablet Take 1 tablet by mouth daily.   Yes [provider]  buPROPion (WELLBUTRIN XL) 300 MG 24  hr tablet Take 300 mg by mouth daily.   Yes [provider]  busPIRone (BUSPAR) 15 MG tablet Take 15 mg by mouth 2 (two) times daily.   Yes [provider]  cholecalciferol (VITAMIN D) 1000 units tablet Take 1,000 Units by mouth daily.   Yes [provider]  DULoxetine (CYMBALTA) 30 MG capsule Take 30 mg by mouth daily.   Yes [provider]  ferrous sulfate 325 (65 FE) MG tablet Take 1 tablet (325 mg total) by mouth 2 (two) times daily with a meal. 04/04/17  Yes Gherghe, Vella Redhead, MD  furosemide (LASIX) 20 MG tablet Take 20 mg by mouth daily.   Yes [provider]  gabapentin (NEURONTIN) 300 MG capsule Take 300 mg by mouth at bedtime.   Yes [provider]  hydrALAZINE (APRESOLINE) 25  MG tablet Take 3 tablets (75 mg total) by mouth every 8 (eight) hours. 04/04/17  Yes Caren Griffins, MD  hydrOXYzine (ATARAX/VISTARIL) 50 MG tablet Take 50 mg by mouth every 6 (six) hours as needed for anxiety.   Yes [provider]  Insulin Glargine (BASAGLAR KWIKPEN) 100 UNIT/ML SOPN Inject 4 Units into the skin daily.   Yes [provider]  levofloxacin (LEVAQUIN) 500 MG tablet Take 500 mg by mouth every evening. 5 day course started 04/08/17   Yes [provider]  levothyroxine (SYNTHROID, LEVOTHROID) 112 MCG tablet Take 112 mcg by mouth daily before breakfast.   Yes [provider]  loperamide (IMODIUM A-D) 2 MG tablet Take 2 mg by mouth every 4 (four) hours as needed for diarrhea or loose stools (do not exceed 4 tablets in 24 hours).   Yes [provider]  montelukast (SINGULAIR) 10 MG tablet Take 10 mg by mouth daily.   Yes [provider]  nitroGLYCERIN (NITROSTAT) 0.4 MG SL tablet Place 0.4 mg under the tongue every 5 (five) minutes as needed for chest pain (max 3 doses).   Yes [provider]  ondansetron (ZOFRAN-ODT) 4 MG disintegrating tablet Take 4 mg by mouth every 6 (six) hours as needed  for nausea or vomiting.   Yes [provider]  oxyCODONE (OXY IR/ROXICODONE) 5 MG immediate release tablet Take 5 mg by mouth every 8 (eight) hours as needed (pain).   Yes [provider]  OXYGEN Inhale 2-4 L into the lungs as needed (for shortness of breath/ to keep oxygen levels above 93%).   Yes [provider]  pantoprazole (PROTONIX) 40 MG tablet Take 40 mg by mouth daily.   Yes [provider]  polyethylene glycol (MIRALAX / GLYCOLAX) packet Take 17 g by mouth daily as needed (constipation).   Yes [provider]  pregabalin (LYRICA) 150 MG capsule Take 150 mg by mouth daily.   Yes [provider]  Probiotic Product (PROBIOTIC PO) Take 1 capsule by mouth daily.   Yes [provider]  sertraline (ZOLOFT) 25 MG tablet Take 25 mg by mouth at bedtime.   Yes [provider]  tiZANidine (ZANAFLEX) 4 MG tablet Take 4 mg by mouth 3 (three) times daily.   Yes [provider]  insulin glargine (LANTUS) 100 UNIT/ML injection Inject 0.04 mLs (4 Units total) into the skin daily. Patient not taking: Reported on 04/12/2017 04/05/17   Caren Griffins, MD  Oxycodone HCl 10 MG TABS Take 0.5 tablets (5 mg total) by mouth 3 (three) times daily as needed (pain). Patient not taking: Reported on 04/12/2017 04/04/17   Caren Griffins, MD    Inpatient medications: . aspirin EC  81 mg Oral Daily  . atorvastatin  40 mg Oral Daily  . buPROPion  300 mg Oral Daily  . busPIRone  15 mg Oral BID  . cholecalciferol  1,000 Units Oral Daily  . DULoxetine  30 mg Oral Daily  . ferrous sulfate  325 mg Oral BID WC  . hydrALAZINE  75 mg Oral Q8H  . [START ON 04/14/2017] Influenza vac split quadrivalent PF  0.5 mL Intramuscular Tomorrow-1000  . insulin aspart  0-20 Units Subcutaneous TID WC  . insulin aspart  0-5 Units Subcutaneous QHS  . levothyroxine  112 mcg Oral QAC breakfast  . mouth rinse  15 mL Mouth Rinse BID  . montelukast  10 mg  Oral Daily  . pantoprazole  40 mg Oral Daily  . [  START ON 04/14/2017] pneumococcal 23 valent vaccine  0.5 mL Intramuscular Tomorrow-1000  . sodium bicarbonate  650 mg Oral BID    Discontinued Meds:   Medications Discontinued During This Encounter  Medication Reason  . sodium bicarbonate tablet 1,300 mg   . vancomycin (VANCOCIN) IVPB 1000 mg/200 mL premix   . b complex vitamins capsule Inpatient Standard  . tiZANidine (ZANAFLEX) 4 MG capsule Inpatient Standard  . albuterol (PROVENTIL HFA;VENTOLIN HFA) 108 (90 Base) MCG/ACT inhaler Change in therapy  . furosemide (LASIX) injection 80 mg   . insulin aspart (novoLOG) injection 0-9 Units     Social History:  reports that she has been smoking Cigarettes.  She has never used smokeless tobacco. She reports that she does not drink alcohol or use drugs.  Family History:  No family history on file.  Pertinent items are noted in HPI. Weight change:   Intake/Output Summary (Last 24 hours) at 04/13/17 1625 Last data filed at 04/13/17 1600  Gross per 24 hour  Intake            586.1 ml  Output             1470 ml  Net           -883.9 ml   BP 134/68 (BP Location: Right Arm)   Pulse (!) 59   Temp 98.1 F (36.7 C) (Oral)   Resp (!) 21   Wt 139 lb 8.8 oz (63.3 kg)   SpO2 90%   BMI 20.61 kg/m  Vitals:   04/13/17 1300 04/13/17 1400 04/13/17 1500 04/13/17 1600  BP: 131/65 135/69 128/60 134/68  Pulse: 60 (!) 59 62 (!) 59  Resp: (!) 22 20 (!) 23 (!) 21  Temp:      TempSrc:      SpO2: (!) 86% (!) 85% (!) 88% 90%  Weight:          General: Vital signs reviewed.  Patient is well-developed, in no acute distress and cooperative with exam.  Head: Normocephalic and atraumatic. Eyes: EOMI, conjunctivae normal, no scleral icterus.  Cardiovascular: RRR, loud systolic murmur. Pulmonary/Chest: Few bilateral basal crackles. Abdominal: Soft, tenderness along right upper quadrant and right flank, non-distended, BS +,  Extremities: 2+ lower  extremity edema bilaterally,  pulses symmetric and intact bilaterally. No cyanosis or clubbing. Skin: Warm, dry and intact. No rashes or erythema. Psychiatric: Normal mood and affect. speech and behavior is normal. Cognition and memory are normal.  Labs: Basic Metabolic Panel:  Recent Labs Lab 04/12/17 1924 04/13/17 0634 04/13/17 1206  NA 130* 132*  --   K 4.1 4.0  --   CL 100* 100*  --   CO2 19* 18*  --   GLUCOSE 176* 148*  --   BUN 40* 41*  --   CREATININE 3.75* 3.71*  --   ALBUMIN 2.4*  --   --   CALCIUM 9.1 8.9  --   PHOS 4.0  --  4.5   Liver Function Tests:  Recent Labs Lab 04/12/17 1924  AST 29  ALT 33  ALKPHOS 107  BILITOT 1.3*  PROT 5.8*  ALBUMIN 2.4*   No results for input(s): LIPASE, AMYLASE in the last 168 hours. No results for input(s): AMMONIA in the last 168 hours. CBC:  Recent Labs Lab 04/13/17 0106  WBC 16.1*  NEUTROABS 15.1*  HGB 8.0*  HCT 24.0*  MCV 79.2  PLT 240   PT/INR: @LABRCNTIP (inr:5) Cardiac Enzymes: )  Recent Labs Lab 04/12/17 1924 04/13/17  0106 04/13/17 0634  TROPONINI 0.04* 0.04* 0.04*   CBG:  Recent Labs Lab 04/12/17 1702 04/12/17 2332 04/13/17 0723 04/13/17 1106 04/13/17 1508  GLUCAP 237* 203* 159* 159* 171*    Iron Studies: No results for input(s): IRON, TIBC, TRANSFERRIN, FERRITIN in the last 168 hours.  Xrays/Other Studies: Dg Chest Port 1 View  Result Date: 04/12/2017 CLINICAL DATA:  Pneumonia versus pulmonary edema EXAM: PORTABLE CHEST 1 VIEW COMPARISON:  04/12/2017 FINDINGS: Enlarged cardiac silhouette. Diffuse fine airspace disease is similar comparison exam. Airspace disease slightly less dense. Lungs are hyperinflated. No pleural fluid. IMPRESSION: Mild improvement in diffuse interstitial and fine pulmonary edema pattern. Stable cardiomegaly. Electronically Signed   By: Suzy Bouchard M.D.   On: 04/12/2017 18:57     Assessment/Plan: 1. Acute on chronic oliguric renal failure. She had some  worsening of her creatinine since her discharge. She does not show much response to Lasix 80 mg every 8 hourly, she was given 1 dose of 160 mg which resulted in some out put,remained oliguric. Due to her history of recent obstructive nephropathy requiring nephrostomy tube placement,we will repeat renal ultrasound to rule out any obstruction. -increase Lasix to 160 mg every 8 hourly for today, we will decrease to 160 mg twice a day tomorrow morning. -Strict intake and output. No indication for dialysis at this time, however given her advanced CKD stage 4 at baseline will need to start educating and preparing her for eventual dialysis.  Will have her watch educational videos and will order vein mapping and VVS evaluation once she is more stable from a pulmonary and cardiac standpoint.  2. CHF/volume overload. Most likely due to combination of worsening renal function, severe MR. Cardiology is following.  3. Anemia. Worsening hemoglobin, currently 8.0, maybe some dilutional effect. Recent history of retro peritoneal hematoma,off anticoagulation now. -Monitor and continue iron supplement and will need to start ESA therapy.  5. HCAP. Per PCCM   Jocelyn Hill 04/13/2017, 4:25 PM   I have seen and examined this patient and agree with plan and assessment in the above note with renal recommendations/intervention highlighted. Continue with Lasix 160mg  IV bid and follow I's/O's and Scr.  Repeat renal US to ensure no recurrence of right-sided hydro since she has an atrophic left kidney.   Governor Rooks Jocelyn Suitt,MD 04/13/2017 4:49 PM

## 2017-04-14 ENCOUNTER — Inpatient Hospital Stay (HOSPITAL_COMMUNITY): Payer: Medicare Other

## 2017-04-14 ENCOUNTER — Other Ambulatory Visit (HOSPITAL_COMMUNITY): Payer: Medicare Other

## 2017-04-14 DIAGNOSIS — N179 Acute kidney failure, unspecified: Secondary | ICD-10-CM

## 2017-04-14 DIAGNOSIS — I5033 Acute on chronic diastolic (congestive) heart failure: Secondary | ICD-10-CM

## 2017-04-14 LAB — RENAL FUNCTION PANEL
Albumin: 2.1 g/dL — ABNORMAL LOW (ref 3.5–5.0)
Anion gap: 12 (ref 5–15)
BUN: 44 mg/dL — ABNORMAL HIGH (ref 6–20)
CO2: 20 mmol/L — ABNORMAL LOW (ref 22–32)
Calcium: 8.7 mg/dL — ABNORMAL LOW (ref 8.9–10.3)
Chloride: 99 mmol/L — ABNORMAL LOW (ref 101–111)
Creatinine, Ser: 3.76 mg/dL — ABNORMAL HIGH (ref 0.44–1.00)
GFR calc Af Amer: 14 mL/min — ABNORMAL LOW (ref >60)
GFR calc non Af Amer: 12 mL/min — ABNORMAL LOW (ref >60)
Glucose, Bld: 120 mg/dL — ABNORMAL HIGH (ref 65–99)
Phosphorus: 4.9 mg/dL — ABNORMAL HIGH (ref 2.5–4.6)
Potassium: 3.5 mmol/L (ref 3.5–5.1)
Sodium: 131 mmol/L — ABNORMAL LOW (ref 135–145)

## 2017-04-14 LAB — CBC
HCT: 24.9 % — ABNORMAL LOW (ref 36.0–46.0)
Hemoglobin: 8.4 g/dL — ABNORMAL LOW (ref 12.0–15.0)
MCH: 26.8 pg (ref 26.0–34.0)
MCHC: 33.7 g/dL (ref 30.0–36.0)
MCV: 79.3 fL (ref 78.0–100.0)
Platelets: 219 10*3/uL (ref 150–400)
RBC: 3.14 MIL/uL — ABNORMAL LOW (ref 3.87–5.11)
RDW: 17.3 % — ABNORMAL HIGH (ref 11.5–15.5)
WBC: 13.6 10*3/uL — ABNORMAL HIGH (ref 4.0–10.5)

## 2017-04-14 LAB — GLUCOSE, CAPILLARY
Glucose-Capillary: 127 mg/dL — ABNORMAL HIGH (ref 65–99)
Glucose-Capillary: 155 mg/dL — ABNORMAL HIGH (ref 65–99)
Glucose-Capillary: 190 mg/dL — ABNORMAL HIGH (ref 65–99)
Glucose-Capillary: 176 mg/dL — ABNORMAL HIGH (ref 65–99)

## 2017-04-14 LAB — IRON AND TIBC
Iron: 21 ug/dL — ABNORMAL LOW (ref 28–170)
Saturation Ratios: 14 % (ref 10.4–31.8)
TIBC: 146 ug/dL — ABNORMAL LOW (ref 250–450)
UIBC: 125 ug/dL

## 2017-04-14 LAB — LEGIONELLA PNEUMOPHILA SEROGP 1 UR AG: L. pneumophila Serogp 1 Ur Ag: NEGATIVE

## 2017-04-14 LAB — PROCALCITONIN: Procalcitonin: 2.87 ng/mL

## 2017-04-14 LAB — FERRITIN: Ferritin: 810 ng/mL — ABNORMAL HIGH (ref 11–307)

## 2017-04-14 MED ORDER — MAGNESIUM SULFATE 2 GM/50ML IV SOLN
2.0000 g | Freq: Once | INTRAVENOUS | Status: AC
  Administered 2017-04-14: 2 g via INTRAVENOUS
  Filled 2017-04-14: qty 50

## 2017-04-14 MED ORDER — POTASSIUM CHLORIDE 20 MEQ/15ML (10%) PO SOLN
20.0000 meq | ORAL | Status: DC
  Filled 2017-04-14: qty 15

## 2017-04-14 MED ORDER — POTASSIUM CHLORIDE CRYS ER 20 MEQ PO TBCR
20.0000 meq | EXTENDED_RELEASE_TABLET | ORAL | Status: AC
  Administered 2017-04-14: 20 meq via ORAL
  Filled 2017-04-14: qty 1

## 2017-04-14 MED ORDER — DARBEPOETIN ALFA 40 MCG/0.4ML IJ SOSY
40.0000 ug | PREFILLED_SYRINGE | INTRAMUSCULAR | Status: DC
  Administered 2017-04-14 – 2017-04-21 (×2): 40 ug via SUBCUTANEOUS
  Filled 2017-04-14 (×3): qty 0.4

## 2017-04-14 MED ORDER — HYDRALAZINE HCL 50 MG PO TABS
100.0000 mg | ORAL_TABLET | Freq: Three times a day (TID) | ORAL | Status: DC
  Administered 2017-04-14 – 2017-04-23 (×27): 100 mg via ORAL
  Filled 2017-04-14 (×31): qty 2

## 2017-04-14 NOTE — Progress Notes (Signed)
Advanced Heart Failure Rounding Note  PCP:  Primary Cardiologist:   Subjective:    Yesterday IV lasix increased to 160 mg TID. Had over 1.5 liters urine output. Weight unchanged. On 80% HFNC.   Remains SOB with exertion. Denies CP.    Objective:   Weight Range: 139 lb 1.8 oz (63.1 kg) Body mass index is 20.54 kg/m.   Vital Signs:   Temp:  [98 F (36.7 C)-98.4 F (36.9 C)] 98 F (36.7 C) (10/15 2309) Pulse Rate:  [56-64] 64 (10/16 0900) Resp:  [14-23] 21 (10/16 0900) BP: (127-154)/(60-96) 141/71 (10/16 0900) SpO2:  [85 %-98 %] 89 % (10/16 0900) FiO2 (%):  [80 %-100 %] 80 % (10/16 0800) Weight:  [139 lb 1.8 oz (63.1 kg)] 139 lb 1.8 oz (63.1 kg) (10/16 0342) Last BM Date: 04/09/17  Weight change: Filed Weights   04/13/17 0500 04/14/17 0342  Weight: 139 lb 8.8 oz (63.3 kg) 139 lb 1.8 oz (63.1 kg)    Intake/Output:   Intake/Output Summary (Last 24 hours) at 04/14/17 0929 Last data filed at 04/14/17 0800  Gross per 24 hour  Intake          1253.85 ml  Output             1515 ml  Net          -261.15 ml      Physical Exam    General:  Chronically ill.  No resp difficulty HEENT: Normal Neck: Supple. JVP to jaw . Carotids 2+ bilat; no bruits. No lymphadenopathy or thyromegaly appreciated. Cor: PMI nondisplaced. Regular rate & rhythm. No rubs, gallops or murmurs. Lungs: Decresed in the bases on 80 HFNC Abdomen: Soft, nontender, nondistended. No hepatosplenomegaly. No bruits or masses. Good bowel sounds. Extremities: No cyanosis, clubbing, rash, RLE and LLE ted hose and SCDs. No edema.  Neuro: Alert & orientedx3, cranial nerves grossly intact. moves all 4 extremities w/o difficulty. Affect pleasant   Telemetry   Junctional Rhythm 60s personally reviewed.   EKG    JunctionalRhythm59 bpm pn admit   Labs    CBC  Recent Labs  04/13/17 0106 04/14/17 0237  WBC 16.1* 13.6*  NEUTROABS 15.1*  --   HGB 8.0* 8.4*  HCT 24.0* 24.9*  MCV 79.2 79.3  PLT  240 993   Basic Metabolic Panel  Recent Labs  04/12/17 1924 04/13/17 0634 04/13/17 1206 04/14/17 0237  NA 130* 132*  --  131*  K 4.1 4.0  --  3.5  CL 100* 100*  --  99*  CO2 19* 18*  --  20*  GLUCOSE 176* 148*  --  120*  BUN 40* 41*  --  44*  CREATININE 3.75* 3.71*  --  3.76*  CALCIUM 9.1 8.9  --  8.7*  MG 1.6*  --  1.6*  --   PHOS 4.0  --  4.5 4.9*   Liver Function Tests  Recent Labs  04/12/17 1924 04/14/17 0237  AST 29  --   ALT 33  --   ALKPHOS 107  --   BILITOT 1.3*  --   PROT 5.8*  --   ALBUMIN 2.4* 2.1*   No results for input(s): LIPASE, AMYLASE in the last 72 hours. Cardiac Enzymes  Recent Labs  04/12/17 1924 04/13/17 0106 04/13/17 0634  TROPONINI 0.04* 0.04* 0.04*    BNP: BNP (last 3 results)  Recent Labs  03/12/17 1400  BNP 2,230.5*    ProBNP (last 3 results) No results for  input(s): PROBNP in the last 8760 hours.   D-Dimer No results for input(s): DDIMER in the last 72 hours. Hemoglobin A1C No results for input(s): HGBA1C in the last 72 hours. Fasting Lipid Panel No results for input(s): CHOL, HDL, LDLCALC, TRIG, CHOLHDL, LDLDIRECT in the last 72 hours. Thyroid Function Tests No results for input(s): TSH, T4TOTAL, T3FREE, THYROIDAB in the last 72 hours.  Invalid input(s): FREET3  Other results:   Imaging    Dg Chest Port 1 View  Result Date: 04/14/2017 CLINICAL DATA:  Short of breath EXAM: PORTABLE CHEST 1 VIEW COMPARISON:  Portable chest x-ray of 04/12/2017 FINDINGS: There is little change to slight worsening of interstitial and alveolar opacities most consistent with slight worsening of edema. Superimposed pneumonia cannot be excluded. Small pleural effusions may be present. Moderate cardiomegaly is stable. No acute bony abnormality is seen. IMPRESSION: Slight worsening of interstitial and alveolar opacities most consistent with edema. Pneumonia cannot be excluded. Electronically Signed   By: Ivar Drape M.D.   On: 04/14/2017  09:23      Medications:     Scheduled Medications: . aspirin EC  81 mg Oral Daily  . atorvastatin  40 mg Oral Daily  . buPROPion  300 mg Oral Daily  . busPIRone  15 mg Oral BID  . cholecalciferol  1,000 Units Oral Daily  . docusate sodium  100 mg Oral BID  . DULoxetine  30 mg Oral Daily  . ferrous sulfate  325 mg Oral BID WC  . hydrALAZINE  75 mg Oral Q8H  . Influenza vac split quadrivalent PF  0.5 mL Intramuscular Tomorrow-1000  . insulin aspart  0-20 Units Subcutaneous TID WC  . insulin aspart  0-5 Units Subcutaneous QHS  . levothyroxine  112 mcg Oral QAC breakfast  . mouth rinse  15 mL Mouth Rinse BID  . montelukast  10 mg Oral Daily  . pantoprazole  40 mg Oral Daily  . pneumococcal 23 valent vaccine  0.5 mL Intramuscular Tomorrow-1000  . sodium bicarbonate  650 mg Oral BID     Infusions: . sodium chloride 10 mL/hr at 04/13/17 1900  . furosemide Stopped (04/14/17 0713)  . magnesium sulfate 1 - 4 g bolus IVPB 2 g (04/14/17 0929)  . nitroGLYCERIN 15 mcg/min (04/14/17 0830)  . 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   Admitted with respiratory failure and volume overload  Assessment/Plan    1. Acute Hypoxic Respiratory Failure: On HFNC 100% oxygen. Per CCM trying to keep sats > 85%.  2. A/C Diastolic Heart Failure-ECHO 02/2017 EF 60-65% with severe MR.  - Volume overloaded. Weight up over 15 pounds. Urine output picking up.  3. A/C CKD Stage IV - Creatinine baseline ~3.3. Nephrology Consulted.  Creatinine unchanged 3.76.   Continue IV lasix 160- mg twice a  day. - Had R hydronephrosis last month and required perc drain. -Renal US this morning pending.  4. HTN- on nitro drip. Increase hydralazine 100 mg three times a day. Should be able to start imdur and stop nitro drip.   5.Severe MV Disease- noted on TEE 02/2017 .Not a candidate for MVR per Dr Roxy Manns  given multiple complications during recent admit. 6. H/O retroperitoneal bleed-02/2017- off anticoagulants.  7.PAF- 02/2017 converted to NSR on amio. Developed RP so anticoagulants stopped. Maintaining Junctional Rhythm.  Continue ted hose+ SCDs fo DVT prophylaxis.Marland Kitchen 8. CAD- S/P MI x2-2012-2013. Last admit LHC was considered but due to worsening renal failure this was deferred. 9. Junctional rhythm  -Off amio.   Length of Stay: 2  Darrick Grinder, NP  04/14/2017, 9:29 AM  Advanced Heart Failure Team Pager [phone] (M-F; 7a - 4p)  Please contact [facility] for night-coverage after hours (4p -7a ) and weekends on [email or web address]  Patient seen with NP, agree with the above note.   1. Acute on chronic diastolic CHF: In the setting of severe MR and CKD stage IV.  She was not on a diuretic at SNF and gained significant weight. She is volume overloaded on exam.  - Continue Lasix 160 mg IV every 8 hrs per renal.  - She is on hydralazine 100 mg tid + NTG gtt at 40 mcg/min for afterload reduction in the setting of severe MR.  BP stable.  2. CKD stage IV: Creatinine fairly stable here. Difficult to diurese, nephrology following.   - No hydronephrosis noted on [place] this admission.  Left kidney is atrophic.  3. 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  With thickened and restricted leaflets, Mitraclip would probably not be an option.  Suspect MR is driving volume overload/CHF.  She was seen by Dr. Roxy Manns at last admission but has had too many comorbidities for surgery.  She is markedly deconditioned.  To get to surgery, she will need aggressive PT to get her more mobile and will also need cardiac cath prior.  It  may be that she would need HD prior to cardiac cath and MV surgery.  4. Atrial fibrillation: Paroxysmal.  She is currently in a junctional rhythm with retrograde Ps.  Amiodarone was stopped.  No anticoagulation with recent RP hematoma.  5. H/o RP hematoma: Off anticoagulants.  6. ID: Suspect pulmonary edema rather than PNA.  She is being covered with broad spectrum abx for now.  7. HTN: Control afterload with hydralazine/nitrates for now. Avoid hypotension.   [name] 04/14/2017 12:04 PM

## 2017-04-14 NOTE — Progress Notes (Signed)
S: 66 year old lady history significant for CHF due to severe mitral regurgitation and AKI. Her respiration remains the same. Creatinine remained stable at 3.76.  O:BP 120/62   Pulse (!) 58   Temp 99 F (37.2 C) (Oral)   Resp 18   Wt 139 lb 1.8 oz (63.1 kg)   SpO2 93%   BMI 20.54 kg/m   Intake/Output Summary (Last 24 hours) at 04/14/17 1433 Last data filed at 04/14/17 1315  Gross per 24 hour  Intake          1289.85 ml  Output             1530 ml  Net          -240.15 ml   Intake/Output: I/O last 3 completed shifts: In: 6213 [P.O.:600; I.V.:647; IV Piggyback:398] Out: 2265 [Urine:2265]  Intake/Output this shift:  Total I/O In: 547 [P.O.:250; I.V.:131; IV Piggyback:166] Out: 525 [Urine:525] Weight change: -7.1 oz (-0.2 kg)   Gen: Chronicaly ill appearing lady, in no acute distress. CVS: Regular rate and rhythm, with systolic murmur. Resp: Decreased bibasilar air entry. Abd: Soft, mild tenderness along right lower quadrant and lower abdominal, bowel sounds positive. Ext: No lower extremity edema.   Recent Labs Lab 04/12/17 1924 04/13/17 0634 04/13/17 1206 04/14/17 0237  NA 130* 132*  --  131*  K 4.1 4.0  --  3.5  CL 100* 100*  --  99*  CO2 19* 18*  --  20*  GLUCOSE 176* 148*  --  120*  BUN 40* 41*  --  44*  CREATININE 3.75* 3.71*  --  3.76*  ALBUMIN 2.4*  --   --  2.1*  CALCIUM 9.1 8.9  --  8.7*  PHOS 4.0  --  4.5 4.9*  AST 29  --   --   --   ALT 33  --   --   --    Liver Function Tests:  Recent Labs Lab 04/12/17 1924 04/14/17 0237  AST 29  --   ALT 33  --   ALKPHOS 107  --   BILITOT 1.3*  --   PROT 5.8*  --   ALBUMIN 2.4* 2.1*   No results for input(s): LIPASE, AMYLASE in the last 168 hours. No results for input(s): AMMONIA in the last 168 hours. CBC:  Recent Labs Lab 04/13/17 0106 04/14/17 0237  WBC 16.1* 13.6*  NEUTROABS 15.1*  --   HGB 8.0* 8.4*  HCT 24.0* 24.9*  MCV 79.2 79.3  PLT 240 219   Cardiac Enzymes:  Recent Labs Lab  04/12/17 1924 04/13/17 0106 04/13/17 0634  TROPONINI 0.04* 0.04* 0.04*   CBG:  Recent Labs Lab 04/13/17 1725 04/13/17 1931 04/13/17 2200 04/14/17 0715 04/14/17 1214  GLUCAP 149* 144* 121* 127* 155*    Iron Studies: No results for input(s): IRON, TIBC, TRANSFERRIN, FERRITIN in the last 72 hours. Studies/Results: US Renal  Result Date: 04/14/2017 CLINICAL DATA:  Acute on chronic kidney injury, recurrent hydronephrosis EXAM: RENAL / URINARY TRACT ULTRASOUND COMPLETE COMPARISON:  CT abdomen/ pelvis dated 03/26/2017 FINDINGS: Right Kidney: Length: 12.4 cm. Echogenic renal parenchyma, suggesting medical renal disease. No hydronephrosis. Left Kidney: Length: 6.4 cm. Echogenic renal parenchyma, suggesting medical renal disease. No hydronephrosis. Bladder: Bladder decompressed by indwelling Foley catheter. IMPRESSION: No hydronephrosis. Left renal atrophy. Echogenic renal parenchyma, suggesting medical renal disease. Electronically Signed   By: Julian Hy M.D.   On: 04/14/2017 11:24   Dg Chest Port 1 View  Result Date: 04/14/2017 CLINICAL DATA:  Short of breath EXAM: PORTABLE CHEST 1 VIEW COMPARISON:  Portable chest x-ray of 04/12/2017 FINDINGS: There is little change to slight worsening of interstitial and alveolar opacities most consistent with slight worsening of edema. Superimposed pneumonia cannot be excluded. Small pleural effusions may be present. Moderate cardiomegaly is stable. No acute bony abnormality is seen. IMPRESSION: Slight worsening of interstitial and alveolar opacities most consistent with edema. Pneumonia cannot be excluded. Electronically Signed   By: Ivar Drape M.D.   On: 04/14/2017 09:23   Dg Chest Port 1 View  Result Date: 04/12/2017 CLINICAL DATA:  Pneumonia versus pulmonary edema EXAM: PORTABLE CHEST 1 VIEW COMPARISON:  04/12/2017 FINDINGS: Enlarged cardiac silhouette. Diffuse fine airspace disease is similar comparison exam. Airspace disease slightly less  dense. Lungs are hyperinflated. No pleural fluid. IMPRESSION: Mild improvement in diffuse interstitial and fine pulmonary edema pattern. Stable cardiomegaly. Electronically Signed   By: Suzy Bouchard M.D.   On: 04/12/2017 18:57   . aspirin EC  81 mg Oral Daily  . atorvastatin  40 mg Oral Daily  . buPROPion  300 mg Oral Daily  . busPIRone  15 mg Oral BID  . cholecalciferol  1,000 Units Oral Daily  . docusate sodium  100 mg Oral BID  . DULoxetine  30 mg Oral Daily  . ferrous sulfate  325 mg Oral BID WC  . hydrALAZINE  100 mg Oral Q8H  . insulin aspart  0-20 Units Subcutaneous TID WC  . insulin aspart  0-5 Units Subcutaneous QHS  . levothyroxine  112 mcg Oral QAC breakfast  . mouth rinse  15 mL Mouth Rinse BID  . montelukast  10 mg Oral Daily  . pantoprazole  40 mg Oral Daily  . sodium bicarbonate  650 mg Oral BID    BMET    Component Value Date/Time   NA 131 (L) 04/14/2017 0237   K 3.5 04/14/2017 0237   CL 99 (L) 04/14/2017 0237   CO2 20 (L) 04/14/2017 0237   GLUCOSE 120 (H) 04/14/2017 0237   BUN 44 (H) 04/14/2017 0237   CREATININE 3.76 (H) 04/14/2017 0237   CALCIUM 8.7 (L) 04/14/2017 0237   GFRNONAA 12 (L) 04/14/2017 0237   GFRAA 14 (L) 04/14/2017 0237   CBC    Component Value Date/Time   WBC 13.6 (H) 04/14/2017 0237   RBC 3.14 (L) 04/14/2017 0237   HGB 8.4 (L) 04/14/2017 0237   HCT 24.9 (L) 04/14/2017 0237   PLT 219 04/14/2017 0237   MCV 79.3 04/14/2017 0237   MCH 26.8 04/14/2017 0237   MCHC 33.7 04/14/2017 0237   RDW 17.3 (H) 04/14/2017 0237   LYMPHSABS 0.4 (L) 04/13/2017 0106   MONOABS 0.6 04/13/2017 0106   EOSABS 0.0 04/13/2017 0106   BASOSABS 0.0 04/13/2017 0106     Assessment/Plan:  1. AKI. Creatinine remained stable, started picking up urinary output. Renal ultrasound was negative for any hydronephrosis, left atrophic kidney.       We will continue Lasix at 160 mg every 8 hourly for       today.      -Continue monitoring renal function.  2. CHF.  Cardiology is following.  3.  Anemia. Hemoglobin stable at 8.4. Will benefit with the start of ESA therapy along with iron supplement.  4. HTN. Currently normotensive with nitro drip and hydralazine. Avoid hypotension.  I have seen and examined this patient and agree with plan and assessment in the above note with renal recommendations/intervention highlighted.  Pt with likely cardiorenal  syndrome due to decompensated CHF due to lack of diuretic therapy.  Renal function is stable and she is slowly diuresing.  Continue with lasix and follow UOP and daily Scr.  Will also start aranesp and follow H/H and iron stores. Governor Rooks Kaizer Dissinger,MD 04/14/2017 3:04 PM    Donetta Potts, MD [facility] [phone]

## 2017-04-14 NOTE — Progress Notes (Signed)
[facility] ADULT ICU REPLACEMENT PROTOCOL FOR AM LAB REPLACEMENT ONLY  The patient does apply for the [facility] Adult ICU Electrolyte Replacment Protocol based on the criteria listed below:   1. Is GFR >/= 40 ml/min? Yes.    Patient's GFR today >60 2. Is urine output >/= 0.5 ml/kg/hr for the last 6 hours? Yes.   Patient's UOP is 0.1 ml/kg/hr 3. Is BUN < 60 mg/dL? Yes.    Patient's BUN today is 22 4. Abnormal electrolyte(s): K+=3.5 5. Ordered repletion with: per protocol 6. If a panic level lab has been reported, has the CCM MD in charge been notified? Yes.  .   Physician:  Johann Capers, Cindra Presume 04/14/2017 5:52 AM

## 2017-04-14 NOTE — Progress Notes (Signed)
[place] / CRITICAL CARE MEDICINE   Name: Jocelyn Hill. [place] MRN: [id number] DOB: 1950/10/09    ADMISSION DATE:  04/12/2017 CONSULTATION DATE:10/14  REFERRING MD:  Odie Sera   CHIEF COMPLAINT:  Acute respiratory failure  HISTORY OF PRESENT ILLNESS:   This is 66 year old female with a significant recent medical history of diastolic heart failure severe mitral valve disease felt due to underlying rheumatic heart disease, stage IV CK D  she left the hospital recently with a creatinine 28f 3.18, paroxysmal atrial fibrillation, recent Escherichia coli bacteremia with pyelonephritis, diabetes, and recent retroperitoneal hematoma. She was discharged on 10/6. During this hospitalization she was diuresed over 26 L her weight went from 143 pounds on admission down to 123 she was deemed euvolemic upon time of discharge. Following discharge she was discharged to a skilled nursing facility with the plan to receive aggressive rehabilitation, and eventually be seen by dental medicine for dental extractions in hopes of eventually undergoing mitral valve surgery should she recover to the point thoracic surgery team for a potential candidate. Her diuretics were stopped upon time of admission to the nursing home, possibly 3 days prior to presentation at [facility] emergency rooms he began to notice increased shortness of breath, a cough with some blood in her sputum, increased weakness, increased swelling, and her lower extremities as well as increase in frequency of chest pain primarily substernal. In the emergency room she was found to have saturations in the 70s chest x-ray consistent with pulmonary edema per report 12-lead showing atrial fibrillation with prolonged QT as well as BNP of over 30,000. Given her prolonged recent hospital stay, and multiple comorbidities as well as prior workup done here, on admission to our intensive care was requested.  SUBJECTIVE:  Overnight noted to be hypoxic to 85% on 80%HFNC. CPAP was  recommended however patient refused. She is satting at 94% on 80% HFNC this AM. She is comfortable-appearing.  VITAL SIGNS: BP (!) 154/73   Pulse 61   Temp 98 F (36.7 C) (Oral)   Resp 17   Wt 139 lb 1.8 oz (63.1 kg)   SpO2 93%   BMI 20.54 kg/m  100% nonrebreather HEMODYNAMICS:    VENTILATOR SETTINGS: FiO2 (%):  [80 %-100 %] 80 %  INTAKE / OUTPUT: I/O last 3 completed shifts: In: 3557 [P.O.:600; I.V.:647; IV Piggyback:398] Out: 2265 [Urine:2265]  PHYSICAL EXAMINATION:  General:   NAD, comfortable, HFNC in place Neuro:   Alert and oriented, answers questions appropriately, no focal deficits HEENT:  NCAT, no scleral icterus, no conjunctival injection Cardiovascular:  RRR, no m/r/g Lungs:  Diminished breath sounds at bases, light scattered rales at bases, comfortable work of breathing, good effort Abdomen:  Soft, nt, nd Skin:  Warm and dry compression stockings in place  LABS:  BMET  Recent Labs Lab 04/12/17 1924 04/13/17 0634 04/14/17 0237  NA 130* 132* 131*  K 4.1 4.0 3.5  CL 100* 100* 99*  CO2 19* 18* 20*  BUN 40* 41* 44*  CREATININE 3.75* 3.71* 3.76*  GLUCOSE 176* 148* 120*    Electrolytes  Recent Labs Lab 04/12/17 1924 04/13/17 0634 04/13/17 1206 04/14/17 0237  CALCIUM 9.1 8.9  --  8.7*  MG 1.6*  --  1.6*  --   PHOS 4.0  --  4.5 4.9*    CBC  Recent Labs Lab 04/13/17 0106 04/14/17 0237  WBC 16.1* 13.6*  HGB 8.0* 8.4*  HCT 24.0* 24.9*  PLT 240 219    Coag's  Recent Labs Lab 04/12/17  1924  APTT 35  INR 1.46    Sepsis Markers  Recent Labs Lab 04/12/17 1924 04/13/17 1129 04/14/17 0237  PROCALCITON 2.06 2.49 2.87    ABG No results for input(s): PHART, PCO2ART, PO2ART in the last 168 hours.  Liver Enzymes  Recent Labs Lab 04/12/17 1924 04/14/17 0237  AST 29  --   ALT 33  --   ALKPHOS 107  --   BILITOT 1.3*  --   ALBUMIN 2.4* 2.1*    Cardiac Enzymes  Recent Labs Lab 04/12/17 1924 04/13/17 0106 04/13/17 0634   TROPONINI 0.04* 0.04* 0.04*    Glucose  Recent Labs Lab 04/13/17 1106 04/13/17 1508 04/13/17 1725 04/13/17 1931 04/13/17 2200 04/14/17 0715  GLUCAP 159* 171* 149* 144* 121* 127*    Imaging No results found.  STUDIES:  9/18 IHK:VQQV ventricular ejection fraction 55-60%. No wall motion abnormality trivial aortic valve regurg. Severe mitral regurgitation restricted in anterior and posterior mitral valve leaflets mild to moderate tricuspid regurgitation  CULTURES: Blood cultures 2 10/14>>.  ANTIBIOTICS: VANCOMYCIN 10/14>>> Zosyn 10/14>>>  SIGNIFICANT EVENTS:   LINES/TUBES: PIV x2, foley  DISCUSSION: 66 year old female patient With known history of severe mitral valve regurgitation and diastolic heart failure recently discharged on 10/6 after a prolonged course for decompensated diastolic heart failure. She was diuresed over 26 L with weight dropping from 143 to 123 at time of discharge. Her diuretics were not continued upon time of discharge Jocelyn Hill presents with a 2 to three-day history of progressive respiratory distress, a BNP of over 30,000,and chest x-ray consistent with pulmonary edema. We will admitted with working diagnosis of decompensated diastolic heart failure, pulmonary edema, and possible healthcare associated pneumonia although this seems less likely.  ASSESSMENT / PLAN: PULM Acute hypoxic respiratory failure in the setting of diffuse pulmonary infiltrates most likely representing acute decompensated diastolic heart failure with pulmonary edema. Also consider healthcare associated pneumonia with evolving ARDS, although seemingly less likely Plan: Supplemental heated high flow oxygen, she is intolerant of noninvasive ventilation due to anxiety and claustrophobia Aggressive diuresis - lasix 160 mg TID yesterday >>BID today Further preload reduction with nitroglycerin drip and hydral Empiric vancomycin and Zosyn for now (Levaquin given QTc  prolongation)  CARDIAC Acute on chronic diastolic heart failure with resultant pulmonary edema Severe mitral valve regurgitation, needs surgical valvular repair however to deconditioned and has poor dentition Paroxysmal atrial fibrillation, not on anticoagulation currently due to recent retroperitoneal hematoma History of demand ischemia Coronary artery disease Dyslipidemia Chest pain/EKG with anterior wall ischemis Plan Cardiology following; keep sats > 85% (HFNC) Aggressive diuresis Continue aspirin, Lipitor Nitro gtt, hydral troponin flat at 0.04, stop trending  RENAL Chronic kidney disease stage IV; Cr 3.76 Plan Nephro following Aggressive diuresis >> 160 mg Lasix TID yesterday, BID today AM BMP pending Renal U/S pending  HEME Anemia of critical illness.  No evidence of bleeding. We are holding anticoagulation given most recent retroperitoneal bleed  Plan Serial CBCs SCDs to lower extremity transfuse for Hgb <8.0 Continue ferrous sulfate - start ESA therapy  ENDO Hypothyroidism Plan Continue Synthroid  History of diabetes type 2 CBG's controlled overnight Plan Continue resistant sliding scale  NEURO History of anxiety Plan: Continue BuSpar and Wellbutrin, as well as Cymbalta  FAMILY  - Updates:   - Inter-disciplinary family meet or Palliative Care meeting due by: 10/22  Everrett Coombe, MD PGY-2 [facility]

## 2017-04-15 ENCOUNTER — Inpatient Hospital Stay (HOSPITAL_COMMUNITY): Payer: Medicare Other

## 2017-04-15 LAB — GLUCOSE, CAPILLARY
Glucose-Capillary: 162 mg/dL — ABNORMAL HIGH (ref 65–99)
Glucose-Capillary: 133 mg/dL — ABNORMAL HIGH (ref 65–99)
Glucose-Capillary: 95 mg/dL (ref 65–99)

## 2017-04-15 LAB — MAGNESIUM: Magnesium: 2 mg/dL (ref 1.7–2.4)

## 2017-04-15 LAB — CBC
HCT: 24.4 % — ABNORMAL LOW (ref 36.0–46.0)
Hemoglobin: 8.2 g/dL — ABNORMAL LOW (ref 12.0–15.0)
MCH: 26.4 pg (ref 26.0–34.0)
MCHC: 33.6 g/dL (ref 30.0–36.0)
MCV: 78.5 fL (ref 78.0–100.0)
Platelets: 223 10*3/uL (ref 150–400)
RBC: 3.11 MIL/uL — ABNORMAL LOW (ref 3.87–5.11)
RDW: 17.3 % — ABNORMAL HIGH (ref 11.5–15.5)
WBC: 9.5 10*3/uL (ref 4.0–10.5)

## 2017-04-15 LAB — RENAL FUNCTION PANEL
Albumin: 2 g/dL — ABNORMAL LOW (ref 3.5–5.0)
Anion gap: 14 (ref 5–15)
BUN: 45 mg/dL — ABNORMAL HIGH (ref 6–20)
CO2: 21 mmol/L — ABNORMAL LOW (ref 22–32)
Calcium: 8.5 mg/dL — ABNORMAL LOW (ref 8.9–10.3)
Chloride: 96 mmol/L — ABNORMAL LOW (ref 101–111)
Creatinine, Ser: 3.59 mg/dL — ABNORMAL HIGH (ref 0.44–1.00)
GFR calc Af Amer: 14 mL/min — ABNORMAL LOW (ref >60)
GFR calc non Af Amer: 12 mL/min — ABNORMAL LOW (ref >60)
Glucose, Bld: 144 mg/dL — ABNORMAL HIGH (ref 65–99)
Phosphorus: 4.6 mg/dL (ref 2.5–4.6)
Potassium: 3 mmol/L — ABNORMAL LOW (ref 3.5–5.1)
Sodium: 131 mmol/L — ABNORMAL LOW (ref 135–145)

## 2017-04-15 LAB — PROCALCITONIN: Procalcitonin: 2.31 ng/mL

## 2017-04-15 MED ORDER — ISOSORBIDE MONONITRATE ER 60 MG PO TB24
60.0000 mg | ORAL_TABLET | Freq: Every day | ORAL | Status: DC
  Administered 2017-04-15 – 2017-04-16 (×2): 60 mg via ORAL
  Filled 2017-04-15 (×2): qty 1

## 2017-04-15 MED ORDER — SODIUM CHLORIDE 0.9 % IV SOLN
510.0000 mg | Freq: Once | INTRAVENOUS | Status: AC
  Administered 2017-04-15: 510 mg via INTRAVENOUS
  Filled 2017-04-15: qty 17

## 2017-04-15 MED ORDER — POTASSIUM CHLORIDE CRYS ER 20 MEQ PO TBCR
40.0000 meq | EXTENDED_RELEASE_TABLET | Freq: Once | ORAL | Status: AC
  Administered 2017-04-15: 40 meq via ORAL
  Filled 2017-04-15: qty 2

## 2017-04-15 NOTE — Progress Notes (Signed)
Pt c/o chest pain rating 5/10. EKG performed. Pt received 2 mg morphine. [place] notified. No new orders at this time. Nursing will continue to monitor.

## 2017-04-15 NOTE — Progress Notes (Addendum)
PULMONARY / CRITICAL CARE MEDICINE   Name: Jocelyn Hill MRN: [id number] DOB: 1951-04-14    ADMISSION DATE:  04/12/2017 CONSULTATION DATE:10/14  REFERRING MD:  Odie Sera   CHIEF COMPLAINT:  Acute respiratory failure  HISTORY OF PRESENT ILLNESS:   This is 66 year old female with a significant recent medical history of diastolic heart failure severe mitral valve disease felt due to underlying rheumatic heart disease, stage IV CK D  she left the hospital recently with a creatinine 21f 3.18, paroxysmal atrial fibrillation, recent Escherichia coli bacteremia with pyelonephritis, diabetes, and recent retroperitoneal hematoma. She was discharged on 10/6. During this hospitalization she was diuresed over 26 L her weight went from 143 pounds on admission down to 123 she was deemed euvolemic upon time of discharge. Following discharge she was discharged to a skilled nursing facility with the plan to receive aggressive rehabilitation, and eventually be seen by dental medicine for dental extractions in hopes of eventually undergoing mitral valve surgery should she recover to the point thoracic surgery team for a potential candidate. Her diuretics were stopped upon time of admission to the nursing home, possibly 3 days prior to presentation at [facility] emergency rooms he began to notice increased shortness of breath, a cough with some blood in her sputum, increased weakness, increased swelling, and her lower extremities as well as increase in frequency of chest pain primarily substernal. In the emergency room she was found to have saturations in the 70s chest x-ray consistent with pulmonary edema per report 12-lead showing atrial fibrillation with prolonged QT as well as BNP of over 30,000. Given her prolonged recent hospital stay, and multiple comorbidities as well as prior workup done here, on admission to our intensive care was requested.  SUBJECTIVE:  Patient did well overnight with no acute events.  VITAL  SIGNS: BP 112/60   Pulse (!) 57   Temp 98 F (36.7 C) (Oral)   Resp 19   Wt 140 lb 14 oz (63.9 kg)   SpO2 94%   BMI 20.80 kg/m  100% nonrebreather HEMODYNAMICS:    VENTILATOR SETTINGS: FiO2 (%):  [55 %-80 %] 55 %  INTAKE / OUTPUT: I/O last 3 completed shifts: In: 2433.9 [P.O.:1150; I.V.:719.9; IV Piggyback:564] Out: 2340 [Urine:2340]  PHYSICAL EXAMINATION:  General:   NAD, HFNC in place Neuro:   AAO, no focal deficits Cardiovascular:  Irregularly irregular, no m/r/g Lungs:  Scattered crackles and bil lung bases Abdomen:  Soft, nt, nd Skin:  Warm and dry compression stockings in place  LABS:  BMET  Recent Labs Lab 04/13/17 0634 04/14/17 0237 04/15/17 0232  NA 132* 131* 131*  K 4.0 3.5 3.0*  CL 100* 99* 96*  CO2 18* 20* 21*  BUN 41* 44* 45*  CREATININE 3.71* 3.76* 3.59*  GLUCOSE 148* 120* 144*    Electrolytes  Recent Labs Lab 04/12/17 1924 04/13/17 0634 04/13/17 1206 04/14/17 0237 04/15/17 0232  CALCIUM 9.1 8.9  --  8.7* 8.5*  MG 1.6*  --  1.6*  --  2.0  PHOS 4.0  --  4.5 4.9* 4.6    CBC  Recent Labs Lab 04/13/17 0106 04/14/17 0237 04/15/17 0232  WBC 16.1* 13.6* 9.5  HGB 8.0* 8.4* 8.2*  HCT 24.0* 24.9* 24.4*  PLT 240 219 223    Coag's  Recent Labs Lab 04/12/17 1924  APTT 35  INR 1.46    Sepsis Markers  Recent Labs Lab 04/13/17 1129 04/14/17 0237 04/15/17 0232  PROCALCITON 2.49 2.87 2.31    ABG No results  for input(s): PHART, PCO2ART, PO2ART in the last 168 hours.  Liver Enzymes  Recent Labs Lab 04/12/17 1924 04/14/17 0237 04/15/17 0232  AST 29  --   --   ALT 33  --   --   ALKPHOS 107  --   --   BILITOT 1.3*  --   --   ALBUMIN 2.4* 2.1* 2.0*    Cardiac Enzymes  Recent Labs Lab 04/12/17 1924 04/13/17 0106 04/13/17 0634  TROPONINI 0.04* 0.04* 0.04*    Glucose  Recent Labs Lab 04/13/17 1931 04/13/17 2200 04/14/17 0715 04/14/17 1214 04/14/17 1816 04/14/17 2237  GLUCAP 144* 121* 127* 155* 190*  176*    Imaging US Renal  Result Date: 04/14/2017 CLINICAL DATA:  Acute on chronic kidney injury, recurrent hydronephrosis EXAM: RENAL / URINARY TRACT ULTRASOUND COMPLETE COMPARISON:  CT abdomen/ pelvis dated 03/26/2017 FINDINGS: Right Kidney: Length: 12.4 cm. Echogenic renal parenchyma, suggesting medical renal disease. No hydronephrosis. Left Kidney: Length: 6.4 cm. Echogenic renal parenchyma, suggesting medical renal disease. No hydronephrosis. Bladder: Bladder decompressed by indwelling Foley catheter. IMPRESSION: No hydronephrosis. Left renal atrophy. Echogenic renal parenchyma, suggesting medical renal disease. Electronically Signed   By: Julian Hy M.D.   On: 04/14/2017 11:24   Dg Chest Port 1 View  Result Date: 04/15/2017 CLINICAL DATA:  66 y/o  F; cough with history of intubation. EXAM: PORTABLE CHEST 1 VIEW COMPARISON:  04/14/2017 chest radiograph. FINDINGS: Stable cardiomegaly. Stable diffuse patchy opacities of the lungs. Stable blunting of costal diaphragmatic angles compatible with trace effusions. Aortic atherosclerosis with calcification. No acute osseous abnormality is evident. IMPRESSION: Stable cardiomegaly. Stable patchy airspace opacities and small effusions probably representing pulmonary edema. Underlying pneumonia not excluded. Electronically Signed   By: Kristine Garbe M.D.   On: 04/15/2017 04:36   Dg Chest Port 1 View  Result Date: 04/14/2017 CLINICAL DATA:  Short of breath EXAM: PORTABLE CHEST 1 VIEW COMPARISON:  Portable chest x-ray of 04/12/2017 FINDINGS: There is little change to slight worsening of interstitial and alveolar opacities most consistent with slight worsening of edema. Superimposed pneumonia cannot be excluded. Small pleural effusions may be present. Moderate cardiomegaly is stable. No acute bony abnormality is seen. IMPRESSION: Slight worsening of interstitial and alveolar opacities most consistent with edema. Pneumonia cannot be excluded.  Electronically Signed   By: Ivar Drape M.D.   On: 04/14/2017 09:23    STUDIES:  9/18 WVP:XTGG ventricular ejection fraction 55-60%. No wall motion abnormality trivial aortic valve regurg. Severe mitral regurgitation restricted in anterior and posterior mitral valve leaflets mild to moderate tricuspid regurgitation  Renal U/S 10/16 >> no hydronephrosis  CULTURES: Blood cultures 2 10/14>>  ANTIBIOTICS: VANCOMYCIN 10/14>>>10/16 Zosyn 10/14>>>10/16  SIGNIFICANT EVENTS:   LINES/TUBES: PIV x2, foley  DISCUSSION: 66 year old female patient With known history of severe mitral valve regurgitation and diastolic heart failure recently discharged on 10/6 after a prolonged course for decompensated diastolic heart failure. She was diuresed over 26 L with weight dropping from 143 to 123 at time of discharge. Her diuretics were not continued upon time of discharge [name] presents with a 2 to three-day history of progressive respiratory distress, a BNP of over 30,000,and chest x-ray consistent with pulmonary edema. We will admitted with working diagnosis of decompensated diastolic heart failure, pulmonary edema, and possible healthcare associated pneumonia although this seems less likely.  ASSESSMENT / PLAN: PULM Acute hypoxic respiratory failure in the setting of diffuse pulmonary infiltrates most likely representing acute decompensated diastolic heart failure with pulmonary edema Plan: Supplemental  heated high flow oxygen, she is intolerant of noninvasive ventilation due to anxiety and claustrophobia Aggressive diuresis - lasix 160 mg TID  Further preload reduction with nitroglycerin drip and hydral Empiric vancomycin and Zosyn discontinued  CARDIAC Acute on chronic diastolic heart failure with resultant pulmonary edema Severe mitral valve regurgitation, needs surgical valvular repair however to deconditioned and has poor dentition Paroxysmal atrial fibrillation, not on anticoagulation  currently due to recent retroperitoneal hematoma History of demand ischemia Coronary artery disease Dyslipidemia Chest pain/EKG with anterior wall ischemis Plan Cardiology following; keep sats > 85% (HFNC) Aggressive diuresis Continue aspirin, Lipitor Nitro gtt, hydral  RENAL Chronic kidney disease stage IV; Cr 3.76>>3.59 Renal U/S with no hydronephrosis, L renal atrophy, echogenic renal parenchyma (c/w renal disease) Plan Nephro following Aggressive diuresis >> 160 mg Lasix TID per renal AM BMP pending   HEME Anemia of critical illness.  No evidence of bleeding. We are holding anticoagulation given most recent retroperitoneal bleed  TIBC and Iron low, consistent with anemia of chronic disease Plan Serial CBCs SCDs to lower extremity transfuse for Hgb <8.0 Continue ferrous sulfate - Arnesep started per renal  ENDO Hypothyroidism Plan Continue Synthroid  History of diabetes type 2 CBG's controlled overnight Plan Continue resistant sliding scale  NEURO History of anxiety Plan: Continue BuSpar and Wellbutrin, as well as Cymbalta  FAMILY  - Updates:   - Inter-disciplinary family meet or Palliative Care meeting due by: 10/22  Everrett Coombe, MD PGY-2 [facility]

## 2017-04-15 NOTE — Progress Notes (Signed)
eLink Physician-Brief Progress Note Patient Name: Jocelyn Hill. Samarin DOB: Aug 27, 1950 MRN: [id number]   Date of Service  04/15/2017  HPI/Events of Note  Chest pain, ordered ECG, given morphine.   eICU Interventions  D/w RN, if CP persists over next hour will check troponins.      Intervention Category Major Interventions: Arrhythmia - evaluation and management  Laverle Hobby 04/15/2017, 11:16 PM

## 2017-04-15 NOTE — Progress Notes (Signed)
  Pt desats in 70-80s. RT called and at the beside

## 2017-04-15 NOTE — Progress Notes (Signed)
eLink Physician-Brief Progress Note Patient Name: Jocelyn Hill DOB: 1950/12/30 MRN: [id number]   Date of Service  04/15/2017  HPI/Events of Note  K+ = 3.0 and Creatinine = 3.59.  eICU Interventions  Will cautiously replace K+.     Intervention Category Major Interventions: Electrolyte abnormality - evaluation and management  Sommer,Steven Eugene 04/15/2017, 5:07 AM

## 2017-04-15 NOTE — Progress Notes (Signed)
S: 66 year old lady history significant for CHF due to severe mitral regurgitation and AKI. patient was feeling better when seen this morning, less shortness of breath. She did desaturate early this morning requiring increased of her HFNC to 100%,she was saturating in the mid 90s and more comfortable since then.  O:BP (!) 142/79   Pulse (!) 55   Temp 98.2 F (36.8 C) (Oral)   Resp 19   Wt 140 lb 14 oz (63.9 kg)   SpO2 97%   BMI 20.80 kg/m   Intake/Output Summary (Last 24 hours) at 04/15/17 1113 Last data filed at 04/15/17 0927  Gross per 24 hour  Intake           1397.4 ml  Output             1710 ml  Net           -312.6 ml   Intake/Output: I/O last 3 completed shifts: In: 1745.9 [P.O.:550; I.V.:697.9; IV Piggyback:498] Out: 2875 [Urine:2875]  Intake/Output this shift:  Total I/O In: 355.4 [P.O.:240; I.V.:49.4; IV Piggyback:66] Out: 60 [Urine:60] Weight change: 1 lb 12.2 oz (0.8 kg) DVV:[id number] ill-appearing lady,sitting upright in her bed, in no acute distress CVS:RRR, systolic murmur. Resp: bilateral basal crackles. Abd: soft, nontender, bowel sounds positive. Ext: no edema, no cyanosis of lower extremities. Pulses intact and symmetrical.   Recent Labs Lab 04/12/17 1924 04/13/17 0634 04/13/17 1206 04/14/17 0237 04/15/17 0232  NA 130* 132*  --  131* 131*  K 4.1 4.0  --  3.5 3.0*  CL 100* 100*  --  99* 96*  CO2 19* 18*  --  20* 21*  GLUCOSE 176* 148*  --  120* 144*  BUN 40* 41*  --  44* 45*  CREATININE 3.75* 3.71*  --  3.76* 3.59*  ALBUMIN 2.4*  --   --  2.1* 2.0*  CALCIUM 9.1 8.9  --  8.7* 8.5*  PHOS 4.0  --  4.5 4.9* 4.6  AST 29  --   --   --   --   ALT 33  --   --   --   --    Liver Function Tests:  Recent Labs Lab 04/12/17 1924 04/14/17 0237 04/15/17 0232  AST 29  --   --   ALT 33  --   --   ALKPHOS 107  --   --   BILITOT 1.3*  --   --   PROT 5.8*  --   --   ALBUMIN 2.4* 2.1* 2.0*   No results for input(s): LIPASE, AMYLASE in the last 168  hours. No results for input(s): AMMONIA in the last 168 hours. CBC:  Recent Labs Lab 04/13/17 0106 04/14/17 0237 04/15/17 0232  WBC 16.1* 13.6* 9.5  NEUTROABS 15.1*  --   --   HGB 8.0* 8.4* 8.2*  HCT 24.0* 24.9* 24.4*  MCV 79.2 79.3 78.5  PLT 240 219 223   Cardiac Enzymes:  Recent Labs Lab 04/12/17 1924 04/13/17 0106 04/13/17 0634  TROPONINI 0.04* 0.04* 0.04*   CBG:  Recent Labs Lab 04/14/17 0715 04/14/17 1214 04/14/17 1816 04/14/17 2237 04/15/17 0713  GLUCAP 127* 155* 190* 176* 162*    Iron Studies:  Recent Labs  04/14/17 1510  IRON 21*  TIBC 146*  FERRITIN 810*   Studies/Results: US Renal  Result Date: 04/14/2017 CLINICAL DATA:  Acute on chronic kidney injury, recurrent hydronephrosis EXAM: RENAL / URINARY TRACT ULTRASOUND COMPLETE COMPARISON:  CT abdomen/ pelvis dated 03/26/2017 FINDINGS: Right  Kidney: Length: 12.4 cm. Echogenic renal parenchyma, suggesting medical renal disease. No hydronephrosis. Left Kidney: Length: 6.4 cm. Echogenic renal parenchyma, suggesting medical renal disease. No hydronephrosis. Bladder: Bladder decompressed by indwelling Foley catheter. IMPRESSION: No hydronephrosis. Left renal atrophy. Echogenic renal parenchyma, suggesting medical renal disease. Electronically Signed   By: Julian Hy M.D.   On: 04/14/2017 11:24   Dg Chest Port 1 View  Result Date: 04/15/2017 CLINICAL DATA:  66 y/o  F; cough with history of intubation. EXAM: PORTABLE CHEST 1 VIEW COMPARISON:  04/14/2017 chest radiograph. FINDINGS: Stable cardiomegaly. Stable diffuse patchy opacities of the lungs. Stable blunting of costal diaphragmatic angles compatible with trace effusions. Aortic atherosclerosis with calcification. No acute osseous abnormality is evident. IMPRESSION: Stable cardiomegaly. Stable patchy airspace opacities and small effusions probably representing pulmonary edema. Underlying pneumonia not excluded. Electronically Signed   By: Kristine Garbe M.D.   On: 04/15/2017 04:36   Dg Chest Port 1 View  Result Date: 04/14/2017 CLINICAL DATA:  Short of breath EXAM: PORTABLE CHEST 1 VIEW COMPARISON:  Portable chest x-ray of 04/12/2017 FINDINGS: There is little change to slight worsening of interstitial and alveolar opacities most consistent with slight worsening of edema. Superimposed pneumonia cannot be excluded. Small pleural effusions may be present. Moderate cardiomegaly is stable. No acute bony abnormality is seen. IMPRESSION: Slight worsening of interstitial and alveolar opacities most consistent with edema. Pneumonia cannot be excluded. Electronically Signed   By: Ivar Drape M.D.   On: 04/14/2017 09:23   . aspirin EC  81 mg Oral Daily  . atorvastatin  40 mg Oral Daily  . buPROPion  300 mg Oral Daily  . busPIRone  15 mg Oral BID  . cholecalciferol  1,000 Units Oral Daily  . darbepoetin (ARANESP) injection - NON-DIALYSIS  40 mcg Subcutaneous Q Tue-1800  . docusate sodium  100 mg Oral BID  . DULoxetine  30 mg Oral Daily  . ferrous sulfate  325 mg Oral BID WC  . hydrALAZINE  100 mg Oral Q8H  . insulin aspart  0-20 Units Subcutaneous TID WC  . insulin aspart  0-5 Units Subcutaneous QHS  . isosorbide mononitrate  60 mg Oral Daily  . levothyroxine  112 mcg Oral QAC breakfast  . mouth rinse  15 mL Mouth Rinse BID  . montelukast  10 mg Oral Daily  . pantoprazole  40 mg Oral Daily  . potassium chloride  40 mEq Oral Once  . sodium bicarbonate  650 mg Oral BID    BMET    Component Value Date/Time   NA 131 (L) 04/15/2017 0232   K 3.0 (L) 04/15/2017 0232   CL 96 (L) 04/15/2017 0232   CO2 21 (L) 04/15/2017 0232   GLUCOSE 144 (H) 04/15/2017 0232   BUN 45 (H) 04/15/2017 0232   CREATININE 3.59 (H) 04/15/2017 0232   CALCIUM 8.5 (L) 04/15/2017 0232   GFRNONAA 12 (L) 04/15/2017 0232   GFRAA 14 (L) 04/15/2017 0232   CBC    Component Value Date/Time   WBC 9.5 04/15/2017 0232   RBC 3.11 (L) 04/15/2017 0232   HGB 8.2  (L) 04/15/2017 0232   HCT 24.4 (L) 04/15/2017 0232   PLT 223 04/15/2017 0232   MCV 78.5 04/15/2017 0232   MCH 26.4 04/15/2017 0232   MCHC 33.6 04/15/2017 0232   RDW 17.3 (H) 04/15/2017 0232   LYMPHSABS 0.4 (L) 04/13/2017 0106   MONOABS 0.6 04/13/2017 0106   EOSABS 0.0 04/13/2017 0106   BASOSABS 0.0 04/13/2017  0106     Assessment/Plan:  1. AKI.  Creatinine and GFR stable. Urinary output continued to improve. Still volume up.      -Continue with current dose of Lasix 60 Mg Every 8 Hourly. -Continue monitoring renal function.  2. CHF. Cardiology is following.continue diuresing.  3.   Anemia. Hemoglobin stable at 8.2 today. Iron studies are positive for iron deficiency. - dose of Feraheme. -Keep monitoring CBC -transfuse prn  4. HTN. Blood pressure was little elevated today. Her nitroglycrin drip was discontinued and she was switched to Imdur.. Continue hydralazine.  5. Mitral valve regurgitation, severe- may want to re-consult CT surgery as she has presented again with decompensated CHF and AKI.  I have seen and examined this patient and agree with plan and assessment in the above note with renal recommendations/intervention highlighted. Scr slowly improving, goal UF 1-2 liters/day to avoid worsening AKI/CKD. Governor Rooks Sahvanna Mcmanigal,MD 04/15/2017 12:07 PM   Donetta Potts, MD [facility] [phone]

## 2017-04-15 NOTE — Progress Notes (Signed)
Advanced Heart Failure Rounding Note  PCP:  Primary Cardiologist:   Subjective:    Continues to diurese with IV lasix. Urine output picking up.   Sats low this morning. HFNC increased to 100%.   Complaining of fatigue.     Objective:   Weight Range: 140 lb 14 oz (63.9 kg) Body mass index is 20.8 kg/m.   Vital Signs:   Temp:  [98 F (36.7 C)-99 F (37.2 C)] 98.2 F (36.8 C) (10/17 0750) Pulse Rate:  [55-105] 60 (10/17 0700) Resp:  [16-33] 18 (10/17 0700) BP: (110-142)/(52-94) 128/64 (10/17 0700) SpO2:  [86 %-99 %] 95 % (10/17 0728) FiO2 (%):  [55 %-80 %] 60 % (10/17 0728) Weight:  [140 lb 14 oz (63.9 kg)] 140 lb 14 oz (63.9 kg) (10/17 0500) Last BM Date: 04/15/17  Weight change: Filed Weights   04/13/17 0500 04/14/17 0342 04/15/17 0500  Weight: 139 lb 8.8 oz (63.3 kg) 139 lb 1.8 oz (63.1 kg) 140 lb 14 oz (63.9 kg)    Intake/Output:   Intake/Output Summary (Last 24 hours) at 04/15/17 0835 Last data filed at 04/15/17 0713  Gross per 24 hour  Intake             1462 ml  Output             1850 ml  Net             -388 ml      Physical Exam    General:  Appears fatigued. NAD  HEENT: normal Neck: supple. JVP to jaw Carotids 2+ bilat; no bruits. No lymphadenopathy or thryomegaly appreciated. Cor: PMI nondisplaced. Regular rate & rhythm. No rubs, gallops or murmurs. Lungs: left crackles. On 100% HFNC Abdomen: soft, nontender, nondistended. No hepatosplenomegaly. No bruits or masses. Good bowel sounds. Extremities: no cyanosis, clubbing, rash, R and LLE SCDs. edema Neuro: alert & orientedx3, cranial nerves grossly intact. moves all 4 extremities w/o difficulty. Affect flat  GU: foley    Telemetry  Junctional Rhythm 50s personally reviewed.   EKG    JunctionalRhythm59 bpm pn admit   Labs    CBC  Recent Labs  04/13/17 0106 04/14/17 0237 04/15/17 0232  WBC 16.1* 13.6* 9.5  NEUTROABS 15.1*  --   --   HGB 8.0* 8.4* 8.2*  HCT 24.0* 24.9*  24.4*  MCV 79.2 79.3 78.5  PLT 240 219 174   Basic Metabolic Panel  Recent Labs  04/13/17 1206 04/14/17 0237 04/15/17 0232  NA  --  131* 131*  K  --  3.5 3.0*  CL  --  99* 96*  CO2  --  20* 21*  GLUCOSE  --  120* 144*  BUN  --  44* 45*  CREATININE  --  3.76* 3.59*  CALCIUM  --  8.7* 8.5*  MG 1.6*  --  2.0  PHOS 4.5 4.9* 4.6   Liver Function Tests  Recent Labs  04/12/17 1924 04/14/17 0237 04/15/17 0232  AST 29  --   --   ALT 33  --   --   ALKPHOS 107  --   --   BILITOT 1.3*  --   --   PROT 5.8*  --   --   ALBUMIN 2.4* 2.1* 2.0*   No results for input(s): LIPASE, AMYLASE in the last 72 hours. Cardiac Enzymes  Recent Labs  04/12/17 1924 04/13/17 0106 04/13/17 0634  TROPONINI 0.04* 0.04* 0.04*    BNP: BNP (last 3 results)  Recent Labs  03/12/17 1400  BNP 2,230.5*    ProBNP (last 3 results) No results for input(s): PROBNP in the last 8760 hours.   D-Dimer No results for input(s): DDIMER in the last 72 hours. Hemoglobin A1C No results for input(s): HGBA1C in the last 72 hours. Fasting Lipid Panel No results for input(s): CHOL, HDL, LDLCALC, TRIG, CHOLHDL, LDLDIRECT in the last 72 hours. Thyroid Function Tests No results for input(s): TSH, T4TOTAL, T3FREE, THYROIDAB in the last 72 hours.  Invalid input(s): FREET3  Other results:   Imaging    US Renal  Result Date: 04/14/2017 CLINICAL DATA:  Acute on chronic kidney injury, recurrent hydronephrosis EXAM: RENAL / URINARY TRACT ULTRASOUND COMPLETE COMPARISON:  CT abdomen/ pelvis dated 03/26/2017 FINDINGS: Right Kidney: Length: 12.4 cm. Echogenic renal parenchyma, suggesting medical renal disease. No hydronephrosis. Left Kidney: Length: 6.4 cm. Echogenic renal parenchyma, suggesting medical renal disease. No hydronephrosis. Bladder: Bladder decompressed by indwelling Foley catheter. IMPRESSION: No hydronephrosis. Left renal atrophy. Echogenic renal parenchyma, suggesting medical renal disease.  Electronically Signed   By: Julian Hy M.D.   On: 04/14/2017 11:24   Dg Chest Port 1 View  Result Date: 04/15/2017 CLINICAL DATA:  66 y/o  F; cough with history of intubation. EXAM: PORTABLE CHEST 1 VIEW COMPARISON:  04/14/2017 chest radiograph. FINDINGS: Stable cardiomegaly. Stable diffuse patchy opacities of the lungs. Stable blunting of costal diaphragmatic angles compatible with trace effusions. Aortic atherosclerosis with calcification. No acute osseous abnormality is evident. IMPRESSION: Stable cardiomegaly. Stable patchy airspace opacities and small effusions probably representing pulmonary edema. Underlying pneumonia not excluded. Electronically Signed   By: Kristine Garbe M.D.   On: 04/15/2017 04:36   Dg Chest Port 1 View  Result Date: 04/14/2017 CLINICAL DATA:  Short of breath EXAM: PORTABLE CHEST 1 VIEW COMPARISON:  Portable chest x-ray of 04/12/2017 FINDINGS: There is little change to slight worsening of interstitial and alveolar opacities most consistent with slight worsening of edema. Superimposed pneumonia cannot be excluded. Small pleural effusions may be present. Moderate cardiomegaly is stable. No acute bony abnormality is seen. IMPRESSION: Slight worsening of interstitial and alveolar opacities most consistent with edema. Pneumonia cannot be excluded. Electronically Signed   By: Ivar Drape M.D.   On: 04/14/2017 09:23     Medications:     Scheduled Medications: . aspirin EC  81 mg Oral Daily  . atorvastatin  40 mg Oral Daily  . buPROPion  300 mg Oral Daily  . busPIRone  15 mg Oral BID  . cholecalciferol  1,000 Units Oral Daily  . darbepoetin (ARANESP) injection - NON-DIALYSIS  40 mcg Subcutaneous Q Tue-1800  . docusate sodium  100 mg Oral BID  . DULoxetine  30 mg Oral Daily  . ferrous sulfate  325 mg Oral BID WC  . hydrALAZINE  100 mg Oral Q8H  . insulin aspart  0-20 Units Subcutaneous TID WC  . insulin aspart  0-5 Units Subcutaneous QHS  .  levothyroxine  112 mcg Oral QAC breakfast  . mouth rinse  15 mL Mouth Rinse BID  . montelukast  10 mg Oral Daily  . pantoprazole  40 mg Oral Daily  . sodium bicarbonate  650 mg Oral BID    Infusions: . sodium chloride 10 mL/hr at 04/15/17 0700  . furosemide Stopped (04/15/17 0713)  . nitroGLYCERIN 40 mcg/min (04/15/17 0700)  . 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   Admitted with respiratory failure and volume overload  Assessment/Plan    1. Acute Hypoxic Respiratory Failure: Back on HFNC 100% oxygen. Per CCM trying to keep sats > 85%.  2. A/C Diastolic Heart Failure-ECHO 02/2017 EF 60-65% with severe MR.  - Improved urine output. Continue current diuretic regimen.  3. A/C CKD Stage IV - Creatinine baseline ~3.3. Nephrology Consulted.  Creatinine trending down 3.76>3.59 .   Continue IV lasix 160- mg twice a day. - Had R hydronephrosis last month and required perc drain. -Renal US 10/16 - no evidence of hydronephrosis.   4. HTN- Stop nitro drip and start 60 mg imdur daily.  Continue hydralazine 100 mg three times a day.  5.Severe MV Disease- noted on TEE 02/2017 .Not a candidate for MVR per Dr Roxy Manns given multiple complications during recent admit. 6. H/O retroperitoneal bleed-02/2017- off anticoagulants.  7.PAF- 02/2017 converted to NSR on amio. Developed RP so anticoagulants stopped. Maintaining Junctional Rhythm.  Continue ted hose+ SCDs fo DVT prophylaxis.Marland Kitchen 8. CAD- S/P MI x2-2012-2013. Last admit LHC was considered but due to worsening renal failure this was deferred. 9.  Junctional rhythm  -Off amio.   Length of Stay: 3  Amy Clegg, NP  04/15/2017, 8:35 AM  Advanced Heart Failure Team Pager [phone] (M-F; 7a - 4p)  Please contact [facility] for night-coverage after hours (4p -7a ) and weekends on [email or web address]  Patient seen with NP, agree with the above note.   1. Acute on chronic diastolic CHF: In the setting of severe MR and CKD stage IV.  She was not on a diuretic at SNF and gained significant weight. She is volume overloaded still on exam. UOP has picked up some.  - Continue Lasix 160 mg IV every 8 hrs per renal.  - For afterload reduction, she is on hydralazine 100 mg tid, can stop NTG gtt and start Imdur 60 mg daily.  2. CKD stage IV: Creatinine slowly trending down. Difficult to diurese, nephrology following.  No hydronephrosis noted on [place] this admission.  Left kidney is atrophic.  3. 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  With thickened and restricted leaflets, Mitraclip would probably not be an option.  Suspect MR is driving volume overload/CHF.  She was seen by Dr. Roxy Manns at last admission but has had too many comorbidities for surgery.  She is markedly deconditioned.  To get to surgery, she will need aggressive PT to get her more mobile and will also need cardiac cath prior.  It may be that she would need HD prior to cardiac cath and MV surgery (cardiac cath right now would have significant risk for causing worsened renal function).  4. Atrial fibrillation: Paroxysmal.  She is still in a junctional rhythm with retrograde Ps.  Amiodarone was stopped.  No anticoagulation with recent RP hematoma.  5. H/o RP hematoma: Off anticoagulants.  6. ID: CXR more concerning for pulmonary edema but PCT elevated at 2.31, so she is being covered with broad spectrum abx for possible HCAP.  7. HTN: Control afterload with hydralazine/nitrates for now. Avoid hypotension.   [name] 04/15/2017 3:42 PM

## 2017-04-16 ENCOUNTER — Encounter (HOSPITAL_COMMUNITY): Payer: Self-pay

## 2017-04-16 ENCOUNTER — Inpatient Hospital Stay (HOSPITAL_COMMUNITY): Payer: Medicare Other | Admitting: Cardiology

## 2017-04-16 ENCOUNTER — Inpatient Hospital Stay (HOSPITAL_COMMUNITY): Payer: Medicare Other

## 2017-04-16 LAB — CBC
HCT: 25.1 % — ABNORMAL LOW (ref 36.0–46.0)
Hemoglobin: 8.5 g/dL — ABNORMAL LOW (ref 12.0–15.0)
MCH: 26.3 pg (ref 26.0–34.0)
MCHC: 33.9 g/dL (ref 30.0–36.0)
MCV: 77.7 fL — ABNORMAL LOW (ref 78.0–100.0)
Platelets: 229 10*3/uL (ref 150–400)
RBC: 3.23 MIL/uL — ABNORMAL LOW (ref 3.87–5.11)
RDW: 17.2 % — ABNORMAL HIGH (ref 11.5–15.5)
WBC: 9.2 10*3/uL (ref 4.0–10.5)

## 2017-04-16 LAB — GLUCOSE, CAPILLARY
Glucose-Capillary: 128 mg/dL — ABNORMAL HIGH (ref 65–99)
Glucose-Capillary: 162 mg/dL — ABNORMAL HIGH (ref 65–99)
Glucose-Capillary: 135 mg/dL — ABNORMAL HIGH (ref 65–99)
Glucose-Capillary: 170 mg/dL — ABNORMAL HIGH (ref 65–99)

## 2017-04-16 LAB — RENAL FUNCTION PANEL
Albumin: 2 g/dL — ABNORMAL LOW (ref 3.5–5.0)
Anion gap: 14 (ref 5–15)
BUN: 41 mg/dL — ABNORMAL HIGH (ref 6–20)
CO2: 23 mmol/L (ref 22–32)
Calcium: 9 mg/dL (ref 8.9–10.3)
Chloride: 96 mmol/L — ABNORMAL LOW (ref 101–111)
Creatinine, Ser: 3.52 mg/dL — ABNORMAL HIGH (ref 0.44–1.00)
GFR calc Af Amer: 15 mL/min — ABNORMAL LOW (ref >60)
GFR calc non Af Amer: 13 mL/min — ABNORMAL LOW (ref >60)
Glucose, Bld: 120 mg/dL — ABNORMAL HIGH (ref 65–99)
Phosphorus: 3.9 mg/dL (ref 2.5–4.6)
Potassium: 3.1 mmol/L — ABNORMAL LOW (ref 3.5–5.1)
Sodium: 133 mmol/L — ABNORMAL LOW (ref 135–145)

## 2017-04-16 MED ORDER — ISOSORBIDE MONONITRATE ER 60 MG PO TB24
90.0000 mg | ORAL_TABLET | Freq: Every day | ORAL | Status: DC
  Administered 2017-04-17 – 2017-04-19 (×3): 90 mg via ORAL
  Filled 2017-04-16 (×3): qty 1

## 2017-04-16 MED ORDER — POTASSIUM CHLORIDE CRYS ER 20 MEQ PO TBCR
40.0000 meq | EXTENDED_RELEASE_TABLET | Freq: Two times a day (BID) | ORAL | Status: AC
  Administered 2017-04-16 (×2): 40 meq via ORAL
  Filled 2017-04-16 (×2): qty 2

## 2017-04-16 MED ORDER — AMLODIPINE BESYLATE 2.5 MG PO TABS
2.5000 mg | ORAL_TABLET | Freq: Every day | ORAL | Status: DC
  Administered 2017-04-16: 2.5 mg via ORAL
  Filled 2017-04-16 (×2): qty 1

## 2017-04-16 NOTE — Progress Notes (Signed)
Daughter requesting update from social work in regard to pt discharge arrangements.  Marissa: [phone]

## 2017-04-16 NOTE — Clinical Social Work Note (Signed)
Clinical Social Work Assessment  Patient Details  Name: Jocelyn Hill MRN: [id number] Date of Birth: 01-Nov-1950  Date of referral:  04/16/17               Reason for consult:  Facility Placement (pt is from [facility] )                Permission sought to share information with:  Family Supports Permission granted to share information::  Yes, Verbal Permission Granted  Name::     Sydnee Cabal  (pt's daughter )  Agency::     Relationship::     Contact Information:     Housing/Transportation Living arrangements for the past 2 months:  [facility] in [place], [place]) Source of Information:  Adult Children Patient Interpreter Needed:  None Criminal Activity/Legal Involvement Pertinent to Current Situation/Hospitalization:  No - Comment as needed Significant Relationships:  Adult Children Lives with:  Facility Resident Do you feel safe going back to the place where you live?  Yes Need for family participation in patient care:  Yes (Comment)  Care giving concerns:  CSW spoke with pt's daughter Chrystal to gather information pt. At this time there are no concerns being expressed to CSW.    Social Worker assessment / plan:  CSW spoke with pt's daughter Chrystal via phone. During this time CSW was mae aware that pt is from [facility] in [place], [place]. The plan per daughter at discharge is to [facility] to. Per daughter, if pt is sent back home after discharge pt would need a CNA. Supports for pt include pt's four children and other family supports.   Employment status:  Retired Nurse, adult PT Recommendations:  Not assessed at this time Information / Referral to community resources:  [place]  Patient/Family's Response to care:  Pt's daughter is understanding and agreeable to plan of care at this time.   Patient/Family's Understanding of and Emotional Response to Diagnosis, Current  Treatment, and Prognosis:  No further questions or concerns have been presented to CSW at this time.  Emotional Assessment Appearance:  Appears stated age Attitude/Demeanor/Rapport:  Unable to Assess Affect (typically observed):  Pleasant Orientation:  Oriented to Self, Oriented to Place, Oriented to  Time, Oriented to Situation Alcohol / Substance use:  Not Applicable Psych involvement (Current and /or in the community):  No (Comment)  Discharge Needs  Concerns to be addressed:  No discharge needs identified Readmission within the last 30 days:  No Current discharge risk:  None Barriers to Discharge:  No Barriers Identified   Wetzel Bjornstad, [place] 04/16/2017, 12:50 PM

## 2017-04-16 NOTE — Progress Notes (Signed)
Pt is from [facility] in [place], [place].

## 2017-04-16 NOTE — Progress Notes (Signed)
Advanced Heart Failure Rounding Note  PCP:  Primary Cardiologist:   Subjective:    Continues to diurese with IV lasix. NTG drip stopped. Imdur started. Weight trending down.   Remains SOB with exertion.    Objective:   Weight Range: 137 lb 2 oz (62.2 kg) Body mass index is 20.25 kg/m.   Vital Signs:   Temp:  [97.6 F (36.4 C)-98.3 F (36.8 C)] 98 F (36.7 C) (10/18 0818) Pulse Rate:  [54-65] 54 (10/18 0900) Resp:  [14-21] 14 (10/18 0900) BP: (130-175)/(5-85) 157/74 (10/18 0900) SpO2:  [85 %-96 %] 96 % (10/18 0900) FiO2 (%):  [90 %-100 %] 90 % (10/18 0818) Weight:  [137 lb 2 oz (62.2 kg)] 137 lb 2 oz (62.2 kg) (10/18 0500) Last BM Date: 04/16/17  Weight change: Filed Weights   04/14/17 0342 04/15/17 0500 04/16/17 0500  Weight: 139 lb 1.8 oz (63.1 kg) 140 lb 14 oz (63.9 kg) 137 lb 2 oz (62.2 kg)    Intake/Output:   Intake/Output Summary (Last 24 hours) at 04/16/17 1020 Last data filed at 04/16/17 0800  Gross per 24 hour  Intake              568 ml  Output             1925 ml  Net            -1357 ml      Physical Exam    General: Chronically ill. NAD. In the chair.  HEENT: normal Neck: supple. JVD to jaw  Carotids 2+ bilat; no bruits. No lymphadenopathy or thryomegaly appreciated. Cor: PMI nondisplaced. Regular rate & rhythm. No rubs, gallops or murmurs. Lungs: Crackles on left. On 100% HFNC Abdomen: soft, nontender, nondistended. No hepatosplenomegaly. No bruits or masses. Good bowel sounds. Extremities: no cyanosis, clubbing, rash, edema LUE dressing. RUE PICC  Neuro: alert & orientedx3, cranial nerves grossly intact. moves all 4 extremities w/o difficulty. Affect flat    Telemetry  Junctional Rhythm 50-60s personally reviewed.     EKG    Junctional Rhythm 59 bpm on admit   Labs    CBC  Recent Labs  04/15/17 0232 04/16/17 0253  WBC 9.5 9.2  HGB 8.2* 8.5*  HCT 24.4* 25.1*  MCV 78.5 77.7*  PLT 223 161   Basic Metabolic  Panel  Recent Labs  04/13/17 1206  04/15/17 0232 04/16/17 0253  NA  --   < > 131* 133*  K  --   < > 3.0* 3.1*  CL  --   < > 96* 96*  CO2  --   < > 21* 23  GLUCOSE  --   < > 144* 120*  BUN  --   < > 45* 41*  CREATININE  --   < > 3.59* 3.52*  CALCIUM  --   < > 8.5* 9.0  MG 1.6*  --  2.0  --   PHOS 4.5  < > 4.6 3.9  < > = values in this interval not displayed. Liver Function Tests  Recent Labs  04/15/17 0232 04/16/17 0253  ALBUMIN 2.0* 2.0*   No results for input(s): LIPASE, AMYLASE in the last 72 hours. Cardiac Enzymes No results for input(s): CKTOTAL, CKMB, CKMBINDEX, TROPONINI in the last 72 hours.  BNP: BNP (last 3 results)  Recent Labs  03/12/17 1400  BNP 2,230.5*    ProBNP (last 3 results) No results for input(s): PROBNP in the last 8760 hours.   D-Dimer No  results for input(s): DDIMER in the last 72 hours. Hemoglobin A1C No results for input(s): HGBA1C in the last 72 hours. Fasting Lipid Panel No results for input(s): CHOL, HDL, LDLCALC, TRIG, CHOLHDL, LDLDIRECT in the last 72 hours. Thyroid Function Tests No results for input(s): TSH, T4TOTAL, T3FREE, THYROIDAB in the last 72 hours.  Invalid input(s): FREET3  Other results:   Imaging    Dg Chest Port 1 View  Result Date: 04/16/2017 CLINICAL DATA:  Shortness of Breath EXAM: PORTABLE CHEST 1 VIEW COMPARISON:  April 15, 2017 and April 12, 2017 FINDINGS: There is extensive airspace opacity in both upper lobes, stable on the left and increased on the right. There is more patchy interstitial and alveolar opacity in the mid lung regions. There is patchy consolidation in the medial left base, stable. There is cardiomegaly. There is mild pulmonary venous hypertension. There is aortic atherosclerosis. No pneumothorax. No bone lesions. IMPRESSION: Extensive upper lobe predominant airspace opacity, stable on the left and increased on the right. Suspect progression of pneumonia, although there may well be  pulmonary edema present. Both entities may exist concurrently. Stable cardiac enlargement. Stable consolidation left lower lobe, felt to represent a degree of pneumonia with likely atelectasis as well. There is aortic atherosclerosis. Aortic Atherosclerosis (ICD10-I70.0). Electronically Signed   By: Lowella Grip III M.D.   On: 04/16/2017 07:36     Medications:     Scheduled Medications: . aspirin EC  81 mg Oral Daily  . atorvastatin  40 mg Oral Daily  . buPROPion  300 mg Oral Daily  . busPIRone  15 mg Oral BID  . cholecalciferol  1,000 Units Oral Daily  . darbepoetin (ARANESP) injection - NON-DIALYSIS  40 mcg Subcutaneous Q Tue-1800  . docusate sodium  100 mg Oral BID  . DULoxetine  30 mg Oral Daily  . ferrous sulfate  325 mg Oral BID WC  . hydrALAZINE  100 mg Oral Q8H  . insulin aspart  0-20 Units Subcutaneous TID WC  . insulin aspart  0-5 Units Subcutaneous QHS  . isosorbide mononitrate  60 mg Oral Daily  . levothyroxine  112 mcg Oral QAC breakfast  . mouth rinse  15 mL Mouth Rinse BID  . montelukast  10 mg Oral Daily  . pantoprazole  40 mg Oral Daily  . potassium chloride  40 mEq Oral BID WC  . sodium bicarbonate  650 mg Oral BID    Infusions: . sodium chloride Stopped (04/16/17 0800)  . furosemide Stopped (04/16/17 0727)  . piperacillin-tazobactam (ZOSYN)  IV Stopped (04/16/17 0606)    PRN Medications: albuterol, bisacodyl, morphine injection, nitroGLYCERIN, ondansetron (ZOFRAN) IV    Patient Profile   Jocelyn Hill is a 66 year old with history of MR underlying rheumatic disease, CAD, HTN, DM, hypothyroidism, A fib, severe mitral valve disease, CKD, r hypdronephrosis, and retroperitoneal hematoma.MVR surgery was deferred last admit due to multiple complications.   Admitted with respiratory failure and volume overload  Assessment/Plan    1. Acute Hypoxic Respiratory Failure: Back on HFNC 100% oxygen. Per CCM trying to keep sats > 85%.  2. A/C Diastolic Heart  Failure-ECHO 02/2017 EF 60-65% with severe MR.  - Volume status improving. Continue IV lasix at current dose.  3. A/C CKD Stage IV - Creatinine baseline ~3.3. Nephrology Consulted.  Creatinine 3.5  Continue IV lasix 160- mg twice a day. - Had R hydronephrosis last month and required perc drain. -Renal US 10/16 - no evidence of hydronephrosis.   4. HTN-  Increase imdur 90 mg daily.   Continue hydralazine 100 mg three times a day.  5.Severe MV Disease- noted on TEE 02/2017 .Not a candidate for MVR per Dr Roxy Manns given multiple complications during recent admit. 6. H/O retroperitoneal bleed-02/2017- off anticoagulants.  7.PAF- 02/2017 converted to NSR on amio. Developed RP so anticoagulants stopped. Maintaining Junctional Rhythm.  Continue ted hose+ SCDs fo DVT prophylaxis.Marland Kitchen 8. CAD- S/P MI x2-2012-2013. Last admit LHC was considered but due to worsening renal failure this was deferred. No s/s ischemia.  9. Junctional rhythm  -Off amio.   Length of Stay: 4  Amy Clegg, NP  04/16/2017, 10:20 AM  Advanced Heart Failure Team Pager [phone] (M-F; 7a - 4p)  Please contact [facility] for night-coverage after hours (4p -7a ) and weekends on [email or web address]  Patient seen with NP, agree with the above note.  1. Acute on chronic diastolic CHF: In the setting of severe MR and CKD stage IV. She was not on a diuretic at SNF and gained significant weight. She is volume overloaded still on exam. Creatinine remains 3.5.  Renal following.  - Continue Lasix 160 mg IV every 8 hrs.  - For afterload reduction, she is on hydralazine 100 mg tid, increase Imdur to 90 mg daily.  - Given ongoing elevated BP, can push afterload reduction with addition of amlodipine 2.5 mg daily.  2. CKD stage IV: Creatinine fairly stable at 3.5. Difficult to diurese, nephrology following. No hydronephrosis noted on [place] this admission. Left kidney is atrophic.  3. 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 With thickened and  restricted leaflets, Mitraclip would probably not be an option. Suspect MR is driving volume overload/CHF. She was seen by Dr. Roxy Manns at last admission but has had too many comorbidities for surgery. She is markedly deconditioned and is not ready for MV surgery at this point. To get to surgery, she will need aggressive PT to get her more mobile and will also need cardiac cath prior. It may be that she would need HD prior to cardiac cath and MV surgery (cardiac cath right now would have significant risk for causing worsened renal function).  4. Atrial fibrillation: Paroxysmal. She is still in a junctional rhythm with retrograde Ps. Amiodarone was stopped. No anticoagulation with recent RP hematoma.  5. H/o RP hematoma: Off anticoagulants.  6. ID: CXR more concerning for pulmonary edema but PCT elevated at 2.31, so she is being covered with Zosyn currently for possible HCAP.  7. HTN: Control afterload with hydralazine/nitrates for now.  BP is still elevated, will add further afterload reduction with amlodipine 2.5 mg daily.   [name] 04/16/2017 1:26 PM

## 2017-04-16 NOTE — Progress Notes (Signed)
PULMONARY / CRITICAL CARE MEDICINE   Name: Jocelyn Hill MRN: [id number] DOB: 08-26-1950    ADMISSION DATE:  04/12/2017 CONSULTATION DATE:10/14  REFERRING MD:  Odie Sera   CHIEF COMPLAINT:  Acute respiratory failure  HISTORY OF PRESENT ILLNESS:   This is 66 year old female with a significant recent medical history of diastolic heart failure severe mitral valve disease felt due to underlying rheumatic heart disease, stage IV CK D  she left the hospital recently with a creatinine 103f 3.18, paroxysmal atrial fibrillation, recent Escherichia coli bacteremia with pyelonephritis, diabetes, and recent retroperitoneal hematoma. She was discharged on 10/6. During this hospitalization she was diuresed over 26 L her weight went from 143 pounds on admission down to 123 she was deemed euvolemic upon time of discharge. Following discharge she was discharged to a skilled nursing facility with the plan to receive aggressive rehabilitation, and eventually be seen by dental medicine for dental extractions in hopes of eventually undergoing mitral valve surgery should she recover to the point thoracic surgery team for a potential candidate. Her diuretics were stopped upon time of admission to the nursing home, possibly 3 days prior to presentation at [facility] emergency rooms he began to notice increased shortness of breath, a cough with some blood in her sputum, increased weakness, increased swelling, and her lower extremities as well as increase in frequency of chest pain primarily substernal. In the emergency room she was found to have saturations in the 70s chest x-ray consistent with pulmonary edema per report 12-lead showing atrial fibrillation with prolonged QT as well as BNP of over 30,000. Given her prolonged recent hospital stay, and multiple comorbidities as well as prior workup done here, on admission to our intensive care was requested.  SUBJECTIVE:  Overnight patient was noted to have chest pain. Chest pain  improved with morphine.  VITAL SIGNS: BP (!) 154/73   Pulse (!) 56   Temp 98.2 F (36.8 C) (Oral)   Resp 16   Wt 137 lb 2 oz (62.2 kg)   SpO2 90%   BMI 20.25 kg/m  100% nonrebreather HEMODYNAMICS:    VENTILATOR SETTINGS: FiO2 (%):  [60 %-100 %] 100 %  INTAKE / OUTPUT: I/O last 3 completed shifts: In: 1990.4 [P.O.:790; I.V.:686.4; IV PXTGGYIRS:854] Out: 2435 [Urine:2435]  PHYSICAL EXAMINATION:  General:   Patient sits in bed, NAD, HFNC in place Neuro:   No focal deficits, alert and oriented Cardiovascular:  RRR, no m/r/g Lungs:  +scattered crackles appreciated throughout lung fields Abdomen:  Soft, nt, nd Skin:  Warm and dry compression stockings in place  LABS:  BMET  Recent Labs Lab 04/14/17 0237 04/15/17 0232 04/16/17 0253  NA 131* 131* 133*  K 3.5 3.0* 3.1*  CL 99* 96* 96*  CO2 20* 21* 23  BUN 44* 45* 41*  CREATININE 3.76* 3.59* 3.52*  GLUCOSE 120* 144* 120*    Electrolytes  Recent Labs Lab 04/12/17 1924  04/13/17 1206 04/14/17 0237 04/15/17 0232 04/16/17 0253  CALCIUM 9.1  < >  --  8.7* 8.5* 9.0  MG 1.6*  --  1.6*  --  2.0  --   PHOS 4.0  --  4.5 4.9* 4.6 3.9  < > = values in this interval not displayed.  CBC  Recent Labs Lab 04/14/17 0237 04/15/17 0232 04/16/17 0253  WBC 13.6* 9.5 9.2  HGB 8.4* 8.2* 8.5*  HCT 24.9* 24.4* 25.1*  PLT 219 223 229    Coag's  Recent Labs Lab 04/12/17 1924  APTT 35  INR  1.46    Sepsis Markers  Recent Labs Lab 04/13/17 1129 04/14/17 0237 04/15/17 0232  PROCALCITON 2.49 2.87 2.31    ABG No results for input(s): PHART, PCO2ART, PO2ART in the last 168 hours.  Liver Enzymes  Recent Labs Lab 04/12/17 1924 04/14/17 0237 04/15/17 0232 04/16/17 0253  AST 29  --   --   --   ALT 33  --   --   --   ALKPHOS 107  --   --   --   BILITOT 1.3*  --   --   --   ALBUMIN 2.4* 2.1* 2.0* 2.0*    Cardiac Enzymes  Recent Labs Lab 04/12/17 1924 04/13/17 0106 04/13/17 0634  TROPONINI 0.04*  0.04* 0.04*    Glucose  Recent Labs Lab 04/14/17 1214 04/14/17 1816 04/14/17 2237 04/15/17 0713 04/15/17 1557 04/15/17 2127  GLUCAP 155* 190* 176* 162* 133* 95    Imaging No results found.  STUDIES:  9/18 MEQ:ASTM ventricular ejection fraction 55-60%. No wall motion abnormality trivial aortic valve regurg. Severe mitral regurgitation restricted in anterior and posterior mitral valve leaflets mild to moderate tricuspid regurgitation  Renal U/S 10/16 >> no hydronephrosis  CULTURES: Blood cultures 2 10/14>>  ANTIBIOTICS: VANCOMYCIN 10/14>>>10/16 Zosyn 10/14>>>  SIGNIFICANT EVENTS:   LINES/TUBES: PIV x2, foley  DISCUSSION: 65 year old female patient With known history of severe mitral valve regurgitation and diastolic heart failure recently discharged on 10/6 after a prolonged course for decompensated diastolic heart failure. She was diuresed over 26 L with weight dropping from 143 to 123 at time of discharge. Her diuretics were not continued upon time of discharge Jocelyn Hill presents with a 2 to three-day history of progressive respiratory distress, a BNP of over 30,000,and chest x-ray consistent with pulmonary edema. We will admitted with working diagnosis of decompensated diastolic heart failure, pulmonary edema, and possible healthcare associated pneumonia although this seems less likely.  ASSESSMENT / PLAN: PULM Acute hypoxic respiratory failure in the setting of diffuse pulmonary infiltrates most likely representing acute decompensated diastolic heart failure with pulmonary edema Plan: Supplemental heated high flow oxygen, she is intolerant of noninvasive ventilation due to anxiety and claustrophobia Aggressive diuresis - lasix 160 mg TID  Further preload reduction with imdur and hydral Continue empiric zosyn, following pct and CXR, low suspicion for infectious process  CARDIAC Acute on chronic diastolic heart failure with resultant pulmonary edema Severe mitral valve  regurgitation, needs surgical valvular repair however to deconditioned and has poor dentition Paroxysmal atrial fibrillation, not on anticoagulation currently due to recent retroperitoneal hematoma History of demand ischemia Coronary artery disease Dyslipidemia Chest pain/EKG with anterior wall ischemis Plan Cardiology following; keep sats > 85% (HFNC). Does not tolerate bipap due to anxiety Aggressive diuresis Continue aspirin, Lipitor Imdur, hydral for preload reduction Needs PT/rehab and to improve before she would be considered for CT surgery for MR  RENAL Chronic kidney disease stage IV; Cr 3.76>>3.59 Renal U/S with no hydronephrosis, L renal atrophy, echogenic renal parenchyma (c/w renal disease) Plan Nephro following Continue aggressive diuresis >> 160 mg Lasix TID per renal Potassium repleted 10/18   HEME Anemia of critical illness.  No evidence of bleeding. We are holding anticoagulation given most recent retroperitoneal bleed  TIBC and Iron low, consistent with anemia of chronic disease Plan Serial CBCs SCDs to lower extremity transfuse for Hgb <8.0 Continue ferrous sulfate Arnesep started per renal  ENDO Hypothyroidism Plan Continue Synthroid  History of diabetes type 2 CBG's controlled overnight Plan Continue resistant sliding scale  NEURO History of anxiety Plan: Continue BuSpar and Wellbutrin, as well as Cymbalta  FAMILY  - Updates:   - Inter-disciplinary family meet or Palliative Care meeting due by: 10/22  Everrett Coombe, MD PGY-2 [facility]

## 2017-04-16 NOTE — Progress Notes (Signed)
Pt expressed concern about not receiving scheduled anxiety medications, stating that she takes Xanax at home. Reviewed chart and noted that pt is receiving scheduled buspirone and bupropion. Educated pt on what medications she was receiving and how they work. Pt and family verbalized understanding of scheduled anxiety medications.

## 2017-04-16 NOTE — Progress Notes (Signed)
S: 66 year old lady history significant for CHF due to severe mitral regurgitation and AKI. Patient was feeling more fatigued today.,had one episode of chest pain overnight which was relieved with morphine. Her HFNC was 90% today, she was saturating in the low 90s. Creatinine remained pretty much stable at 3.52, very little improvement from 3.59.  O:BP (!) 148/85   Pulse (!) 58   Temp 98 F (36.7 C) (Oral)   Resp 18   Wt 137 lb 2 oz (62.2 kg)   SpO2 93%   BMI 20.25 kg/m   Intake/Output Summary (Last 24 hours) at 04/16/17 1054 Last data filed at 04/16/17 1000  Gross per 24 hour  Intake              568 ml  Output             2025 ml  Net            -1457 ml   Intake/Output: I/O last 3 completed shifts: In: 1353.4 [P.O.:240; I.V.:533.4; IV Piggyback:580] Out: 2935 [Urine:2935]  Intake/Output this shift:  Total I/O In: 10 [I.V.:10] Out: 400 [Urine:400] Weight change: -3 lb 12 oz (-1.7 kg) Gen: chronically ill-appearing lady,sitting in her chair, in no acute distress. CVS: regular rate and rhythm with systolic murmur. Resp: bilateral basal crackles. Abd: soft, non tender, bowel sounds +. Ext: no lower extremity edema.   Recent Labs Lab 04/12/17 1924 04/13/17 0634 04/13/17 1206 04/14/17 0237 04/15/17 0232 04/16/17 0253  NA 130* 132*  --  131* 131* 133*  K 4.1 4.0  --  3.5 3.0* 3.1*  CL 100* 100*  --  99* 96* 96*  CO2 19* 18*  --  20* 21* 23  GLUCOSE 176* 148*  --  120* 144* 120*  BUN 40* 41*  --  44* 45* 41*  CREATININE 3.75* 3.71*  --  3.76* 3.59* 3.52*  ALBUMIN 2.4*  --   --  2.1* 2.0* 2.0*  CALCIUM 9.1 8.9  --  8.7* 8.5* 9.0  PHOS 4.0  --  4.5 4.9* 4.6 3.9  AST 29  --   --   --   --   --   ALT 33  --   --   --   --   --    Liver Function Tests:  Recent Labs Lab 04/12/17 1924 04/14/17 0237 04/15/17 0232 04/16/17 0253  AST 29  --   --   --   ALT 33  --   --   --   ALKPHOS 107  --   --   --   BILITOT 1.3*  --   --   --   PROT 5.8*  --   --   --    ALBUMIN 2.4* 2.1* 2.0* 2.0*   No results for input(s): LIPASE, AMYLASE in the last 168 hours. No results for input(s): AMMONIA in the last 168 hours. CBC:  Recent Labs Lab 04/13/17 0106 04/14/17 0237 04/15/17 0232 04/16/17 0253  WBC 16.1* 13.6* 9.5 9.2  NEUTROABS 15.1*  --   --   --   HGB 8.0* 8.4* 8.2* 8.5*  HCT 24.0* 24.9* 24.4* 25.1*  MCV 79.2 79.3 78.5 77.7*  PLT 240 219 223 229   Cardiac Enzymes:  Recent Labs Lab 04/12/17 1924 04/13/17 0106 04/13/17 0634  TROPONINI 0.04* 0.04* 0.04*   CBG:  Recent Labs Lab 04/14/17 2237 04/15/17 0713 04/15/17 1557 04/15/17 2127 04/16/17 0721  GLUCAP 176* 162* 133* 95 128*    Iron  Studies:  Recent Labs  04/14/17 1510  IRON 21*  TIBC 146*  FERRITIN 810*   Studies/Results: US Renal  Result Date: 04/14/2017 CLINICAL DATA:  Acute on chronic kidney injury, recurrent hydronephrosis EXAM: RENAL / URINARY TRACT ULTRASOUND COMPLETE COMPARISON:  CT abdomen/ pelvis dated 03/26/2017 FINDINGS: Right Kidney: Length: 12.4 cm. Echogenic renal parenchyma, suggesting medical renal disease. No hydronephrosis. Left Kidney: Length: 6.4 cm. Echogenic renal parenchyma, suggesting medical renal disease. No hydronephrosis. Bladder: Bladder decompressed by indwelling Foley catheter. IMPRESSION: No hydronephrosis. Left renal atrophy. Echogenic renal parenchyma, suggesting medical renal disease. Electronically Signed   By: Julian Hy M.D.   On: 04/14/2017 11:24   Dg Chest Port 1 View  Result Date: 04/16/2017 CLINICAL DATA:  Shortness of Breath EXAM: PORTABLE CHEST 1 VIEW COMPARISON:  April 15, 2017 and April 12, 2017 FINDINGS: There is extensive airspace opacity in both upper lobes, stable on the left and increased on the right. There is more patchy interstitial and alveolar opacity in the mid lung regions. There is patchy consolidation in the medial left base, stable. There is cardiomegaly. There is mild pulmonary venous hypertension.  There is aortic atherosclerosis. No pneumothorax. No bone lesions. IMPRESSION: Extensive upper lobe predominant airspace opacity, stable on the left and increased on the right. Suspect progression of pneumonia, although there may well be pulmonary edema present. Both entities may exist concurrently. Stable cardiac enlargement. Stable consolidation left lower lobe, felt to represent a degree of pneumonia with likely atelectasis as well. There is aortic atherosclerosis. Aortic Atherosclerosis (ICD10-I70.0). Electronically Signed   By: Lowella Grip III M.D.   On: 04/16/2017 07:36   Dg Chest Port 1 View  Result Date: 04/15/2017 CLINICAL DATA:  66 y/o  F; cough with history of intubation. EXAM: PORTABLE CHEST 1 VIEW COMPARISON:  04/14/2017 chest radiograph. FINDINGS: Stable cardiomegaly. Stable diffuse patchy opacities of the lungs. Stable blunting of costal diaphragmatic angles compatible with trace effusions. Aortic atherosclerosis with calcification. No acute osseous abnormality is evident. IMPRESSION: Stable cardiomegaly. Stable patchy airspace opacities and small effusions probably representing pulmonary edema. Underlying pneumonia not excluded. Electronically Signed   By: Kristine Garbe M.D.   On: 04/15/2017 04:36   . aspirin EC  81 mg Oral Daily  . atorvastatin  40 mg Oral Daily  . buPROPion  300 mg Oral Daily  . busPIRone  15 mg Oral BID  . cholecalciferol  1,000 Units Oral Daily  . darbepoetin (ARANESP) injection - NON-DIALYSIS  40 mcg Subcutaneous Q Tue-1800  . docusate sodium  100 mg Oral BID  . DULoxetine  30 mg Oral Daily  . ferrous sulfate  325 mg Oral BID WC  . hydrALAZINE  100 mg Oral Q8H  . insulin aspart  0-20 Units Subcutaneous TID WC  . insulin aspart  0-5 Units Subcutaneous QHS  . [START ON 04/17/2017] isosorbide mononitrate  90 mg Oral Daily  . levothyroxine  112 mcg Oral QAC breakfast  . mouth rinse  15 mL Mouth Rinse BID  . montelukast  10 mg Oral Daily  .  pantoprazole  40 mg Oral Daily  . potassium chloride  40 mEq Oral BID WC  . sodium bicarbonate  650 mg Oral BID    BMET    Component Value Date/Time   NA 133 (L) 04/16/2017 0253   K 3.1 (L) 04/16/2017 0253   CL 96 (L) 04/16/2017 0253   CO2 23 04/16/2017 0253   GLUCOSE 120 (H) 04/16/2017 0253   BUN 41 (H) 04/16/2017  0253   CREATININE 3.52 (H) 04/16/2017 0253   CALCIUM 9.0 04/16/2017 0253   GFRNONAA 13 (L) 04/16/2017 0253   GFRAA 15 (L) 04/16/2017 0253   CBC    Component Value Date/Time   WBC 9.2 04/16/2017 0253   RBC 3.23 (L) 04/16/2017 0253   HGB 8.5 (L) 04/16/2017 0253   HCT 25.1 (L) 04/16/2017 0253   PLT 229 04/16/2017 0253   MCV 77.7 (L) 04/16/2017 0253   MCH 26.3 04/16/2017 0253   MCHC 33.9 04/16/2017 0253   RDW 17.2 (H) 04/16/2017 0253   LYMPHSABS 0.4 (L) 04/13/2017 0106   MONOABS 0.6 04/13/2017 0106   EOSABS 0.0 04/13/2017 0106   BASOSABS 0.0 04/13/2017 0106     Assessment/Plan:  1.  AKI. Creatinine pretty much stable. Urinary output stable.       -Continue Lasix 160 mg every 8 hourly.      -continue monitoring renal function.  2. CHF. Cardiology is following, Still having bilateral basal crackles,which will be hard to control because of her severe mitral valve regurgitation.       we will continue diuresis. He will get benefit with valve replacement if she becomes a good candidate for surgery.  3. Anemia. Hemoglobin stable. Had 1 dose of Feraheme yesterday.-Continue oral iron supplement.  4. HTN. Blood pressure remained difficult elevated, imdur dose was increased to 90 mg daily, continue hydralazine at 100 mg 3 times a day.  I have seen and examined this patient and agree with plan and assessment in the above note with renal recommendations/intervention highlighted. Currently not a candidate for MVR per Dr. Roxy Manns not sure if things have changed. Governor Rooks Briget Shaheed,MD 04/16/2017 12:06 PM   Donetta Potts, MD [facility] [phone]

## 2017-04-17 ENCOUNTER — Inpatient Hospital Stay (HOSPITAL_COMMUNITY): Payer: Medicare Other

## 2017-04-17 LAB — GLUCOSE, CAPILLARY
Glucose-Capillary: 148 mg/dL — ABNORMAL HIGH (ref 65–99)
Glucose-Capillary: 122 mg/dL — ABNORMAL HIGH (ref 65–99)
Glucose-Capillary: 173 mg/dL — ABNORMAL HIGH (ref 65–99)
Glucose-Capillary: 171 mg/dL — ABNORMAL HIGH (ref 65–99)

## 2017-04-17 LAB — RENAL FUNCTION PANEL
Albumin: 2.1 g/dL — ABNORMAL LOW (ref 3.5–5.0)
Anion gap: 11 (ref 5–15)
BUN: 36 mg/dL — ABNORMAL HIGH (ref 6–20)
CO2: 27 mmol/L (ref 22–32)
Calcium: 9.2 mg/dL (ref 8.9–10.3)
Chloride: 96 mmol/L — ABNORMAL LOW (ref 101–111)
Creatinine, Ser: 3.34 mg/dL — ABNORMAL HIGH (ref 0.44–1.00)
GFR calc Af Amer: 16 mL/min — ABNORMAL LOW (ref >60)
GFR calc non Af Amer: 13 mL/min — ABNORMAL LOW (ref >60)
Glucose, Bld: 140 mg/dL — ABNORMAL HIGH (ref 65–99)
Phosphorus: 2.9 mg/dL (ref 2.5–4.6)
Potassium: 3 mmol/L — ABNORMAL LOW (ref 3.5–5.1)
Sodium: 134 mmol/L — ABNORMAL LOW (ref 135–145)

## 2017-04-17 LAB — CBC
HCT: 27.1 % — ABNORMAL LOW (ref 36.0–46.0)
Hemoglobin: 9 g/dL — ABNORMAL LOW (ref 12.0–15.0)
MCH: 26 pg (ref 26.0–34.0)
MCHC: 33.2 g/dL (ref 30.0–36.0)
MCV: 78.3 fL (ref 78.0–100.0)
Platelets: 265 10*3/uL (ref 150–400)
RBC: 3.46 MIL/uL — ABNORMAL LOW (ref 3.87–5.11)
RDW: 17.3 % — ABNORMAL HIGH (ref 11.5–15.5)
WBC: 7.5 10*3/uL (ref 4.0–10.5)

## 2017-04-17 LAB — CULTURE, BLOOD (ROUTINE X 2)
Culture: NO GROWTH
Culture: NO GROWTH
Special Requests: ADEQUATE

## 2017-04-17 LAB — MAGNESIUM: Magnesium: 1.5 mg/dL — ABNORMAL LOW (ref 1.7–2.4)

## 2017-04-17 MED ORDER — POTASSIUM CHLORIDE CRYS ER 20 MEQ PO TBCR
40.0000 meq | EXTENDED_RELEASE_TABLET | Freq: Two times a day (BID) | ORAL | Status: AC
  Administered 2017-04-17 – 2017-04-18 (×4): 40 meq via ORAL
  Filled 2017-04-17 (×5): qty 2

## 2017-04-17 MED ORDER — AMLODIPINE BESYLATE 5 MG PO TABS
5.0000 mg | ORAL_TABLET | Freq: Every day | ORAL | Status: DC
  Administered 2017-04-17 – 2017-04-18 (×2): 5 mg via ORAL
  Filled 2017-04-17 (×3): qty 1

## 2017-04-17 MED ORDER — MAGNESIUM SULFATE 2 GM/50ML IV SOLN
2.0000 g | Freq: Once | INTRAVENOUS | Status: AC
  Administered 2017-04-17: 2 g via INTRAVENOUS
  Filled 2017-04-17: qty 50

## 2017-04-17 MED ORDER — ACETAMINOPHEN 325 MG PO TABS: 650.0000 mg | ORAL_TABLET | Freq: Four times a day (QID) | ORAL | Status: DC | PRN

## 2017-04-17 MED ORDER — ACETAMINOPHEN-CODEINE #3 300-30 MG PO TABS
1.0000 | ORAL_TABLET | Freq: Four times a day (QID) | ORAL | Status: DC | PRN
  Administered 2017-04-17 – 2017-04-23 (×13): 1 via ORAL
  Filled 2017-04-17 (×14): qty 1

## 2017-04-17 NOTE — Progress Notes (Signed)
S: 66 year old lady history significant for CHF due to severe mitral regurgitation and AKI. Patient was feeling little better today, eating breakfast in her bed. Family wants her to move to [place]. Cardiothoracic surgery is still do not think that she is a good candidate for valve replacement.  O:BP (!) 157/79   Pulse 72   Temp 97.7 F (36.5 C) (Oral)   Resp (!) 23   Wt 130 lb 15.3 oz (59.4 kg)   SpO2 (!) 86%   BMI 19.34 kg/m   Intake/Output Summary (Last 24 hours) at 04/17/17 0939 Last data filed at 04/17/17 0800  Gross per 24 hour  Intake             1433 ml  Output             1500 ml  Net              -67 ml   Intake/Output: I/O last 3 completed shifts: In: 0254 [P.O.:940; I.V.:220; IV Piggyback:580] Out: 3125 [Urine:3125]  Intake/Output this shift:  Total I/O In: 55 [P.O.:45; I.V.:10] Out: -  Weight change: -6 lb 2.8 oz (-2.8 kg) Gen: Frail, chronically ill-appearing lady, in no acute distress. CVS: regular rate and rhythm with systolic murmur. Resp: bilateral basal crackles. Abd: soft, nondistended, nontender, bowel sounds positive. Ext: No edema, no cyanosis, pulses intact and symmetrical.   Recent Labs Lab 04/12/17 1924 04/13/17 0634 04/13/17 1206 04/14/17 0237 04/15/17 0232 04/16/17 0253 04/17/17 0412  NA 130* 132*  --  131* 131* 133* 134*  K 4.1 4.0  --  3.5 3.0* 3.1* 3.0*  CL 100* 100*  --  99* 96* 96* 96*  CO2 19* 18*  --  20* 21* 23 27  GLUCOSE 176* 148*  --  120* 144* 120* 140*  BUN 40* 41*  --  44* 45* 41* 36*  CREATININE 3.75* 3.71*  --  3.76* 3.59* 3.52* 3.34*  ALBUMIN 2.4*  --   --  2.1* 2.0* 2.0* 2.1*  CALCIUM 9.1 8.9  --  8.7* 8.5* 9.0 9.2  PHOS 4.0  --  4.5 4.9* 4.6 3.9 2.9  AST 29  --   --   --   --   --   --   ALT 33  --   --   --   --   --   --    Liver Function Tests:  Recent Labs Lab 04/12/17 1924  04/15/17 0232 04/16/17 0253 04/17/17 0412  AST 29  --   --   --   --   ALT 33  --   --   --   --   ALKPHOS 107  --   --    --   --   BILITOT 1.3*  --   --   --   --   PROT 5.8*  --   --   --   --   ALBUMIN 2.4*  < > 2.0* 2.0* 2.1*  < > = values in this interval not displayed. No results for input(s): LIPASE, AMYLASE in the last 168 hours. No results for input(s): AMMONIA in the last 168 hours. CBC:  Recent Labs Lab 04/13/17 0106 04/14/17 0237 04/15/17 0232 04/16/17 0253 04/17/17 0412  WBC 16.1* 13.6* 9.5 9.2 7.5  NEUTROABS 15.1*  --   --   --   --   HGB 8.0* 8.4* 8.2* 8.5* 9.0*  HCT 24.0* 24.9* 24.4* 25.1* 27.1*  MCV 79.2 79.3 78.5 77.7* 78.3  PLT 240 219 223 229 265   Cardiac Enzymes:  Recent Labs Lab 04/12/17 1924 04/13/17 0106 04/13/17 0634  TROPONINI 0.04* 0.04* 0.04*   CBG:  Recent Labs Lab 04/16/17 0721 04/16/17 1132 04/16/17 1625 04/16/17 2317 04/17/17 0728  GLUCAP 128* 162* 135* 170* 148*    Iron Studies:  Recent Labs  04/14/17 1510  IRON 21*  TIBC 146*  FERRITIN 810*   Studies/Results: Dg Chest Port 1 View  Result Date: 04/17/2017 CLINICAL DATA:  Pneumonia. EXAM: PORTABLE CHEST 1 VIEW COMPARISON:  04/17/2015. FINDINGS: Severe cardiomegaly again noted. Persistent bilateral bilateral pulmonary infiltrates/edema most prominently upper lobes again noted without interim change. Small left pleural effusion. No pneumothorax . IMPRESSION: Severe cardiomegaly. Persistent bilateral pulmonary infiltrates/edema most prominent in the upper lobes again noted. No interim change. Small left pleural effusion . Electronically Signed   By: Marcello Moores  Register   On: 04/17/2017 06:41   Dg Chest Port 1 View  Result Date: 04/16/2017 CLINICAL DATA:  Shortness of Breath EXAM: PORTABLE CHEST 1 VIEW COMPARISON:  April 15, 2017 and April 12, 2017 FINDINGS: There is extensive airspace opacity in both upper lobes, stable on the left and increased on the right. There is more patchy interstitial and alveolar opacity in the mid lung regions. There is patchy consolidation in the medial left base,  stable. There is cardiomegaly. There is mild pulmonary venous hypertension. There is aortic atherosclerosis. No pneumothorax. No bone lesions. IMPRESSION: Extensive upper lobe predominant airspace opacity, stable on the left and increased on the right. Suspect progression of pneumonia, although there may well be pulmonary edema present. Both entities may exist concurrently. Stable cardiac enlargement. Stable consolidation left lower lobe, felt to represent a degree of pneumonia with likely atelectasis as well. There is aortic atherosclerosis. Aortic Atherosclerosis (ICD10-I70.0). Electronically Signed   By: Lowella Grip III M.D.   On: 04/16/2017 07:36   . amLODipine  5 mg Oral Daily  . aspirin EC  81 mg Oral Daily  . atorvastatin  40 mg Oral Daily  . buPROPion  300 mg Oral Daily  . busPIRone  15 mg Oral BID  . cholecalciferol  1,000 Units Oral Daily  . darbepoetin (ARANESP) injection - NON-DIALYSIS  40 mcg Subcutaneous Q Tue-1800  . docusate sodium  100 mg Oral BID  . DULoxetine  30 mg Oral Daily  . ferrous sulfate  325 mg Oral BID WC  . hydrALAZINE  100 mg Oral Q8H  . insulin aspart  0-20 Units Subcutaneous TID WC  . insulin aspart  0-5 Units Subcutaneous QHS  . isosorbide mononitrate  90 mg Oral Daily  . levothyroxine  112 mcg Oral QAC breakfast  . mouth rinse  15 mL Mouth Rinse BID  . montelukast  10 mg Oral Daily  . pantoprazole  40 mg Oral Daily  . potassium chloride  40 mEq Oral BID  . sodium bicarbonate  650 mg Oral BID    BMET    Component Value Date/Time   NA 134 (L) 04/17/2017 0412   K 3.0 (L) 04/17/2017 0412   CL 96 (L) 04/17/2017 0412   CO2 27 04/17/2017 0412   GLUCOSE 140 (H) 04/17/2017 0412   BUN 36 (H) 04/17/2017 0412   CREATININE 3.34 (H) 04/17/2017 0412   CALCIUM 9.2 04/17/2017 0412   GFRNONAA 13 (L) 04/17/2017 0412   GFRAA 16 (L) 04/17/2017 0412   CBC    Component Value Date/Time   WBC 7.5 04/17/2017 0412   RBC 3.46 (L) 04/17/2017  0412   HGB 9.0 (L)  04/17/2017 0412   HCT 27.1 (L) 04/17/2017 0412   PLT 265 04/17/2017 0412   MCV 78.3 04/17/2017 0412   MCH 26.0 04/17/2017 0412   MCHC 33.2 04/17/2017 0412   RDW 17.3 (H) 04/17/2017 0412   LYMPHSABS 0.4 (L) 04/13/2017 0106   MONOABS 0.6 04/13/2017 0106   EOSABS 0.0 04/13/2017 0106   BASOSABS 0.0 04/13/2017 0106     Assessment/Plan:  1. AKI. Creatinine continue to have little improvement, pretty much stable at this time. Making good amount of urine, 1800 mL over last 24 hour period. - Continue Lasix 160 mg every 8 hourly. -keep monitoring renal function.  2. CHF. Cardiology is following, her breathing was better today and her oxygen requirement was decreased, at 60% with HFNC.she is still not a good candidate for mitral valve replacement surgery according to cardiovascular.  3. Anemia. Hemoglobin Little improved,it was 9.0 today. - Continue iron supplement.  4. HTN. Blood pressure remained elevated,amlodipine 5 mg was added to her existing regimen of hydralazine and Imdur.  I have seen and examined this patient and agree with plan and assessment in the above note with renal recommendations/intervention highlighted.  She is slowly improving, however she will need to markedly improve her conditioning in order to be considered for MVR. Governor Rooks Asucena Galer,MD 04/17/2017 2:03 PM   Donetta Potts, MD [facility] [phone]

## 2017-04-17 NOTE — Progress Notes (Signed)
CSW spoke with pt's daughter [name] regarding discharge plans for pt at this time. CSW explained to Marissa that CSW had spoken with pt's other daughter Baruch Gouty) on yesterday to complete assessment on pt as well as sought information on what SNF pt was from. Today Marissa sought information on when mom would be being discharged. CSW explained to her that unfortunately  CSW is not allowed to make that call on discharging pt's and that daughter would have to speak with doctors or RN on the unit. Marissa expressed being displeased with the care that pt has been receiving here at [facility] from doctors and sought information on pt being transferred to [facility] in [place]. CSW attempted to explained to [place] that CSW works with placing pt's in facilities that are needing it as opposed to transferring pt's to other hospitals for care. CSW  suggested that Marissa speak with a RN or doctor about transferring pt if family is wanting to do so. [name] was headed to work and was unable to speak with CSW about further details.    CSW is unsure as to if family I wanting pt to go back to [facility] at this time.     Virgie Dad Nameer Summer, MSW, [facility] Clinical Social Worker [phone]

## 2017-04-17 NOTE — Progress Notes (Signed)
Advanced Heart Failure Rounding Note  PCP:  Primary Cardiologist:   Subjective:    Continues to diurese with IV lasix. Making over 1.8 liters  Of urine. Weight coming down. Down to 60% HFNC   Complaining of fatigue. Family wants her to move to [place] with the son.    Objective:   Weight Range: 130 lb 15.3 oz (59.4 kg) Body mass index is 19.34 kg/m.   Vital Signs:   Temp:  [97.5 F (36.4 C)-98.7 F (37.1 C)] 97.7 F (36.5 C) (10/19 0727) Pulse Rate:  [54-114] 72 (10/19 0800) Resp:  [14-23] 23 (10/19 0800) BP: (133-175)/(63-99) 157/79 (10/19 0800) SpO2:  [86 %-97 %] 86 % (10/19 0800) FiO2 (%):  [60 %-70 %] 60 % (10/19 0730) Weight:  [130 lb 15.3 oz (59.4 kg)] 130 lb 15.3 oz (59.4 kg) (10/19 0500) Last BM Date: 04/16/17  Weight change: Filed Weights   04/15/17 0500 04/16/17 0500 04/17/17 0500  Weight: 140 lb 14 oz (63.9 kg) 137 lb 2 oz (62.2 kg) 130 lb 15.3 oz (59.4 kg)    Intake/Output:   Intake/Output Summary (Last 24 hours) at 04/17/17 0825 Last data filed at 04/17/17 0800  Gross per 24 hour  Intake             1433 ml  Output             1500 ml  Net              -67 ml      Physical Exam    General:  Appears fatigued. No resp difficulty HEENT: normal Neck: supple. JVP ~10. Carotids 2+ bilat; no bruits. No lymphadenopathy or thryomegaly appreciated. Cor: PMI nondisplaced. Regular rate & rhythm. No rubs, gallops or murmurs. Lungs: Decreased throughout. On HFNC  Abdomen: soft, nontender, nondistended. No hepatosplenomegaly. No bruits or masses. Good bowel sounds. Extremities: no cyanosis, clubbing, rash, edema Neuro: alert & orientedx3, cranial nerves grossly intact. moves all 4 extremities w/o difficulty. Affect pleasant   Telemetry  NSR 60-70s with PVCs (personally reviewed)    EKG    Junctional Rhythm 59 bpm on admit   Labs    CBC  Recent Labs  04/16/17 0253 04/17/17 0412  WBC 9.2 7.5  HGB 8.5* 9.0*  HCT 25.1* 27.1*  MCV 77.7*  78.3  PLT 229 779   Basic Metabolic Panel  Recent Labs  04/15/17 0232 04/16/17 0253 04/17/17 0412  NA 131* 133* 134*  K 3.0* 3.1* 3.0*  CL 96* 96* 96*  CO2 21* 23 27  GLUCOSE 144* 120* 140*  BUN 45* 41* 36*  CREATININE 3.59* 3.52* 3.34*  CALCIUM 8.5* 9.0 9.2  MG 2.0  --  1.5*  PHOS 4.6 3.9 2.9   Liver Function Tests  Recent Labs  04/16/17 0253 04/17/17 0412  ALBUMIN 2.0* 2.1*   No results for input(s): LIPASE, AMYLASE in the last 72 hours. Cardiac Enzymes No results for input(s): CKTOTAL, CKMB, CKMBINDEX, TROPONINI in the last 72 hours.  BNP: BNP (last 3 results)  Recent Labs  03/12/17 1400  BNP 2,230.5*    ProBNP (last 3 results) No results for input(s): PROBNP in the last 8760 hours.   D-Dimer No results for input(s): DDIMER in the last 72 hours. Hemoglobin A1C No results for input(s): HGBA1C in the last 72 hours. Fasting Lipid Panel No results for input(s): CHOL, HDL, LDLCALC, TRIG, CHOLHDL, LDLDIRECT in the last 72 hours. Thyroid Function Tests No results for input(s): TSH, T4TOTAL, T3FREE,  THYROIDAB in the last 72 hours.  Invalid input(s): FREET3  Other results:   Imaging    Dg Chest Port 1 View  Result Date: 04/17/2017 CLINICAL DATA:  Pneumonia. EXAM: PORTABLE CHEST 1 VIEW COMPARISON:  04/17/2015. FINDINGS: Severe cardiomegaly again noted. Persistent bilateral bilateral pulmonary infiltrates/edema most prominently upper lobes again noted without interim change. Small left pleural effusion. No pneumothorax . IMPRESSION: Severe cardiomegaly. Persistent bilateral pulmonary infiltrates/edema most prominent in the upper lobes again noted. No interim change. Small left pleural effusion . Electronically Signed   By: Marcello Moores  Register   On: 04/17/2017 06:41     Medications:     Scheduled Medications: . amLODipine  2.5 mg Oral Daily  . aspirin EC  81 mg Oral Daily  . atorvastatin  40 mg Oral Daily  . buPROPion  300 mg Oral Daily  . busPIRone   15 mg Oral BID  . cholecalciferol  1,000 Units Oral Daily  . darbepoetin (ARANESP) injection - NON-DIALYSIS  40 mcg Subcutaneous Q Tue-1800  . docusate sodium  100 mg Oral BID  . DULoxetine  30 mg Oral Daily  . ferrous sulfate  325 mg Oral BID WC  . hydrALAZINE  100 mg Oral Q8H  . insulin aspart  0-20 Units Subcutaneous TID WC  . insulin aspart  0-5 Units Subcutaneous QHS  . isosorbide mononitrate  90 mg Oral Daily  . levothyroxine  112 mcg Oral QAC breakfast  . mouth rinse  15 mL Mouth Rinse BID  . montelukast  10 mg Oral Daily  . pantoprazole  40 mg Oral Daily  . potassium chloride  40 mEq Oral BID  . sodium bicarbonate  650 mg Oral BID    Infusions: . sodium chloride 10 mL/hr at 04/17/17 0800  . furosemide Stopped (04/17/17 0724)  . magnesium sulfate 1 - 4 g bolus IVPB    . 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   Admitted with respiratory failure and volume overload  Assessment/Plan    1. Acute Hypoxic Respiratory Failure: Back on HFNC 100% oxygen. Per CCM trying to keep sats > 85%. Weaning oxygen as tolerated.  2. A/C Diastolic Heart Failure-ECHO 02/2017 EF 60-65% with severe MR.  - Volume status improving. Continue IV lasix at current dose.  - Anticipate transitioning to po lasix prior to discharge.  - Would make sure she has lasix on discharge.  3. A/C CKD Stage IV - Creatinine baseline ~3.3. Nephrology Consulted. Creatinine 3.34.   - Continue IV lasix 160 mg twice a day. Making >1.8 liters urine.  - Had R hydronephrosis last month and required perc drain. - Renal  US 10/16 - no evidence of hydronephrosis.   4. HTN- Continue imdur 90 mg daily, hydralazine 100 mg three times a day and increase amlodipine to 5 mg daily.  5.Severe MV Disease- noted on TEE 02/2017. Not a candidate for MVR per Dr Roxy Manns given multiple complications during recent admit. 6. H/O retroperitoneal bleed-02/2017- off anticoagulants.  7.PAF- 02/2017 converted to NSR on amio. Developed RP so anticoagulants stopped.  Had been in junctional rhythm here now back in NSR.  Continue ted hose+ SCDs fo DVT  prophylaxis. - Amiodarone stopped with junctional rhythm.  8. CAD- S/P MI x2-2012-2013. Last admit LHC was considered but due to worsening renal failure this was deferred. No s/s ischemia.   Disposition- ? Family considering transfer to [place].   Length of Stay: [place], NP  04/17/2017, 8:25 AM  Advanced Heart Failure Team Pager [phone] (M-F; 7a - 4p)  Please contact [facility] for night-coverage after hours (4p -7a ) and weekends on [email or web address]  Patient seen with NP, agree with the above note.  1. Acute on chronic diastolic CHF: In the setting of severe MR and CKD stage IV. She was not on a diuretic at SNF and gained significant weight. She is volume overloaded stillon exam but weight coming down with diuresis (I/Os not accurate with incontinence). Creatinine remains 3.3.  Renal following.  - Continue Lasix 160 mg IV every 8 hrs for now.  - For afterload reduction, she is on hydralazine 100 mg tid, Imdur 90 mg daily.  - Given ongoing elevated BP, can push afterload reduction with increase in amlodipine to 5 mg daily.  2. CKD stage IV: Creatinine fairly stable at 3.3. Difficult to diurese, nephrology following. No hydronephrosis noted on [place] this admission. Left kidney is atrophic.  3. 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 With thickened and restricted leaflets, Mitraclip would probably not be an option. Suspect MR is driving volume overload/CHF. She was seen  by Dr. Roxy Manns at last admission but has had too many comorbidities for surgery. She is markedly deconditioned and is not ready for MV surgery at this point. To get to surgery, she will need aggressive PT to get her more mobile and will also need cardiac cath prior. It may be that she would need HD prior to cardiac cath and MV surgery (cardiac cath right now would have significant risk for causing worsened renal function).  4. Atrial fibrillation: Paroxysmal.Amiodarone was stopped due to junctional rhythm, now back in NSR today. No anticoagulation with recent RP hematoma.  5. H/o RP hematoma: Off anticoagulants.  6. ID: CXR more concerning for pulmonary edema but PCT was elevated at 2.31, so she is being covered with Zosyn currently for possible HCAP.  7. HTN: Control afterload with hydralazine/nitrates for now.  BP is still elevated, increase amlodipine to 5 mg daily today.   [name] 04/17/2017 11:19 AM

## 2017-04-17 NOTE — Progress Notes (Signed)
66 year old woman with severe mitral regurgitation and stage IV CK D with prolonged admission 9/13-10/6 for acute pulmonary edema, Escherichia coli bacteremia, course completed by retroperitoneal hematoma. On discharge her creatinine was 3.1 and her weight. 123 pounds, she was readmitted10/14 with bilateral infiltrates, and hypoxia, wt was up by 18 lbs  High flow oxygen and slowly been titrated down to 60%  On exam-no respiratory distress, respiratory rate 20s, bilateral scattered crackles, S1-S2normal, ,no edema  Chest x-ray reviewed by me continues to show bilateral infiltrates, some confluence right upper lobe Labs show severe hypokalemia 3.0, creatinine  Decreasing to 3.3. She has good urine output response with Lasix  Impression/plan  Acute pulmonary edema/- ct Lasix to 160 mg every 8, has good response to this Nitrates and hydralazine being titrated by advanced heart failure service service Not a candidate for mitral valve replacement until her general condition improves, per Dr Roxy Manns Amiodarone has been held due to junctional rhythm  Acute respiratory failure - she did not tolerate BiPAP due to claustrophobia  Titrate high flow oxygen down as oxygenation improves  Doubt HCAP but continue zosyn x 7ds  AKI on CK D4- Renal following,Lasix as above Replete potassium  Transfer to stepdown unit and to triad 10/20  Rigoberto Noel. MD

## 2017-04-18 DIAGNOSIS — I480 Paroxysmal atrial fibrillation: Secondary | ICD-10-CM

## 2017-04-18 DIAGNOSIS — E1121 Type 2 diabetes mellitus with diabetic nephropathy: Secondary | ICD-10-CM

## 2017-04-18 DIAGNOSIS — I051 Rheumatic mitral insufficiency: Secondary | ICD-10-CM

## 2017-04-18 LAB — GLUCOSE, CAPILLARY
Glucose-Capillary: 110 mg/dL — ABNORMAL HIGH (ref 65–99)
Glucose-Capillary: 225 mg/dL — ABNORMAL HIGH (ref 65–99)
Glucose-Capillary: 147 mg/dL — ABNORMAL HIGH (ref 65–99)
Glucose-Capillary: 113 mg/dL — ABNORMAL HIGH (ref 65–99)

## 2017-04-18 LAB — CBC WITH DIFFERENTIAL/PLATELET
Basophils Absolute: 0.1 10*3/uL (ref 0.0–0.1)
Basophils Relative: 1 %
Eosinophils Absolute: 0.2 10*3/uL (ref 0.0–0.7)
Eosinophils Relative: 2 %
HCT: 32.3 % — ABNORMAL LOW (ref 36.0–46.0)
Hemoglobin: 10.7 g/dL — ABNORMAL LOW (ref 12.0–15.0)
Lymphocytes Relative: 11 %
Lymphs Abs: 0.8 10*3/uL (ref 0.7–4.0)
MCH: 26.5 pg (ref 26.0–34.0)
MCHC: 33.1 g/dL (ref 30.0–36.0)
MCV: 80 fL (ref 78.0–100.0)
Monocytes Absolute: 0.6 10*3/uL (ref 0.1–1.0)
Monocytes Relative: 7 %
Neutro Abs: 6.1 10*3/uL (ref 1.7–7.7)
Neutrophils Relative %: 79 %
Platelets: 301 10*3/uL (ref 150–400)
RBC: 4.04 MIL/uL (ref 3.87–5.11)
RDW: 18.5 % — ABNORMAL HIGH (ref 11.5–15.5)
WBC: 7.8 10*3/uL (ref 4.0–10.5)

## 2017-04-18 LAB — TROPONIN I: Troponin I: 0.12 ng/mL (ref <0.03)

## 2017-04-18 LAB — RENAL FUNCTION PANEL
Albumin: 2.2 g/dL — ABNORMAL LOW (ref 3.5–5.0)
Anion gap: 12 (ref 5–15)
BUN: 32 mg/dL — ABNORMAL HIGH (ref 6–20)
CO2: 30 mmol/L (ref 22–32)
Calcium: 9.3 mg/dL (ref 8.9–10.3)
Chloride: 92 mmol/L — ABNORMAL LOW (ref 101–111)
Creatinine, Ser: 3.04 mg/dL — ABNORMAL HIGH (ref 0.44–1.00)
GFR calc Af Amer: 17 mL/min — ABNORMAL LOW (ref >60)
GFR calc non Af Amer: 15 mL/min — ABNORMAL LOW (ref >60)
Glucose, Bld: 89 mg/dL (ref 65–99)
Phosphorus: 2.5 mg/dL (ref 2.5–4.6)
Potassium: 3.1 mmol/L — ABNORMAL LOW (ref 3.5–5.1)
Sodium: 134 mmol/L — ABNORMAL LOW (ref 135–145)

## 2017-04-18 LAB — BASIC METABOLIC PANEL
Anion gap: 13 (ref 5–15)
BUN: 31 mg/dL — ABNORMAL HIGH (ref 6–20)
CO2: 29 mmol/L (ref 22–32)
Calcium: 9.5 mg/dL (ref 8.9–10.3)
Chloride: 91 mmol/L — ABNORMAL LOW (ref 101–111)
Creatinine, Ser: 3.51 mg/dL — ABNORMAL HIGH (ref 0.44–1.00)
GFR calc Af Amer: 15 mL/min — ABNORMAL LOW (ref >60)
GFR calc non Af Amer: 13 mL/min — ABNORMAL LOW (ref >60)
Glucose, Bld: 111 mg/dL — ABNORMAL HIGH (ref 65–99)
Potassium: 3.6 mmol/L (ref 3.5–5.1)
Sodium: 133 mmol/L — ABNORMAL LOW (ref 135–145)

## 2017-04-18 LAB — MAGNESIUM: Magnesium: 1.6 mg/dL — ABNORMAL LOW (ref 1.7–2.4)

## 2017-04-18 MED ORDER — MAGNESIUM GLUCONATE 500 MG PO TABS
250.0000 mg | ORAL_TABLET | Freq: Every day | ORAL | Status: DC
  Administered 2017-04-18 – 2017-04-23 (×6): 250 mg via ORAL
  Filled 2017-04-18 (×6): qty 1

## 2017-04-18 MED ORDER — DEXTROSE 5 % IV SOLN
160.0000 mg | Freq: Two times a day (BID) | INTRAVENOUS | Status: DC
  Administered 2017-04-18 – 2017-04-19 (×3): 160 mg via INTRAVENOUS
  Filled 2017-04-18 (×4): qty 16
  Filled 2017-04-18: qty 4

## 2017-04-18 MED ORDER — AMLODIPINE BESYLATE 10 MG PO TABS
10.0000 mg | ORAL_TABLET | Freq: Every day | ORAL | Status: DC
  Administered 2017-04-19 – 2017-04-23 (×5): 10 mg via ORAL
  Filled 2017-04-18 (×5): qty 1

## 2017-04-18 NOTE — Progress Notes (Addendum)
Pt complaining for chest pain at level of 5, nitro given but not effective. Notified MD at bedside, will continue to monitor and reassess.

## 2017-04-18 NOTE — Progress Notes (Signed)
Pt taken off heated HFNC and placed on [place] HFNC at 8L. Pt denies SOB, no increased WOB, VS within normal limits. RN made aware. RT will continue to monitor.

## 2017-04-18 NOTE — Progress Notes (Signed)
PROGRESS NOTE    Jocelyn Hill. 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 During this hospitalization she was diuresed over 26 L her weight went from 143 pounds on admission down to 123 she was deemed euvolemic upon time of discharge. Following discharge she was discharged to a skilled nursing facility with the plan to receive aggressive rehabilitation, and eventually be seen by dental medicine for dental extractions in hopes of eventually undergoing mitral valve surgery should she recover to the point thoracic surgery team for a potential candidate. Her diuretics were stopped upon time of admission to the nursing home, possibly 3 days prior to presentation at [facility] emergency rooms he began to notice increased shortness of breath, a cough with some blood in her sputum, increased weakness, increased swelling, and her lower extremities as well as increase in frequency of chest pain primarily substernal. In the emergency room she was found to have saturations in the 70s chest x-ray consistent with pulmonary edema per report 12-lead showing atrial fibrillation with prolonged QT as well as BNP of over 30,000. Given her prolonged recent hospital stay, and multiple comorbidities as well as prior workup done here, on admission to our intensive care was requested.       Subjective: 10/20 A/O 4, negative CP, positive SOB, negative N/V, negative abdominal pain. States does not weigh herself daily.does not know base weight.   Assessment & Plan:   Active Problems:   Acute heart failure ([place])   Acute  pulmonary edema ([place])  HCAP? -questionable pneumonia completed 7 days Zosyn  Acute respiratory failure with hypoxia -Diffuse pulmonary infiltrates most likely secondary to decompensated diastolic CHF with pulmonary edema. -Titrate O2 to maintain SPO2> 93%  Acute on Chronic Diastolic CHF -Strict in and out since admission -6.9 L -Daily weight Filed Weights   04/16/17 0500 04/17/17 0500 04/18/17 0500  Weight: 137 lb 2 oz (62.2 kg) 130 lb 15.3 oz (59.4 kg) 119 lb 4.3 oz (54.1 kg)  -Aggressive diuresis Lasix 160 mg TID -amlodipine 5 mg daily -hydralazine 100 mg TID -Imdur 90 mg daily -transfuse for hemoglobin< 8 -consult to nutrition for education on heart healthy diet/low sodium diet  Acute Pulmonary edema -continue aggressive diuresis see CHF  Paroxysmal atrial fibrillation  -not on anticoagulation secondary to recent retroperitoneal bleed. -Rate controlled  CAD See CHF  Rheumatic Severe Mitral Valve regurgitation  -at last admission cardio thoracic surgery stated patient currently not candidate for surgical treatment of severe rheumatic mitral valve regurgitation -Per CHF team note from last admission patient needs aggressive CHF management as well as dental service consultation prior to surgery consideration.  HLD -Lipitor 40 mg daily  Acute on CKD stage IV(Upon discharge on 10/6 Cr= 3.18)   Recent Labs Lab 04/13/17 0634 04/14/17 0237 04/15/17 0232 04/16/17 0253 04/17/17 0412 04/18/17 0357  CREATININE 3.71* 3.76* 3.59* 3.52* 3.34* 3.04*  -creatinine improved with aggressive diuresis. Better than at time of discharge -Renal U/S with no hydronephrosis, L renal atrophy, echogenic renal parenchyma (c/w renal disease)  Anemia of critical illness.   -No evidence of bleeding.  -Holding anticoagulation given most recent retroperitoneal bleed  -anemia panel consistent with anemia of chronic diseasetransfuse for Hgb <  8.0 -Arnesep started per renal    Hypothyroidism -Synthroid   Diabetes type 2 Controlled with Renal Complication -4/09 Hemoglobin A1c= 6.2 -resistant SSI  Anxiety - Continue BuSpar and Wellbutrin, as well as Cymbalta  Hypokalemia -Potassium goal> 4 -K-Dur 80 mEq  Hypomagnesemia -Magnesium goal> 2 -Magnesium IV 2 g   DVT prophylaxis: SCD Code Status: full Family Communication: none Disposition Plan: TBD   Consultants:  [facility]    Procedures/Significant Events:     I have personally reviewed and interpreted all radiology studies and my findings are as above.  VENTILATOR SETTINGS: none   Cultures none  Antimicrobials: Anti-infectives    Start     Stop   04/14/17 0700  vancomycin (VANCOCIN) IVPB 750 mg/150 ml premix  Status:  Discontinued     04/14/17 0826   04/13/17 0500  piperacillin-tazobactam (ZOSYN) IVPB 2.25 g     04/17/17 2035   04/12/17 1930  vancomycin (VANCOCIN) IVPB 1000 mg/200 mL premix  Status:  Discontinued     04/12/17 1915   04/12/17 1930  piperacillin-tazobactam (ZOSYN) IVPB 3.375 g     04/12/17 2052       Devices    LINES / TUBES:      Continuous Infusions: . sodium chloride 10 mL/hr at 04/17/17 2200  . furosemide Stopped (04/18/17 0615)     Objective: Vitals:   04/18/17 0500 04/18/17 0708 04/18/17 0730 04/18/17 0800  BP:    (!) 168/87  Pulse:    65  Resp:    (!) 21  Temp:   98 F (36.7 C)   TempSrc:   Oral   SpO2:  96%  94%  Weight: 119 lb 4.3 oz (54.1 kg)       Intake/Output Summary (Last 24 hours) at 04/18/17 0910 Last data filed at 04/18/17 0800  Gross per 24 hour  Intake              957 ml  Output             4300 ml  Net            -3343 ml   Filed Weights   04/16/17 0500 04/17/17 0500 04/18/17 0500  Weight: 137 lb 2 oz (62.2 kg) 130 lb 15.3 oz (59.4 kg) 119 lb 4.3 oz (54.1 kg)     Examination:  General: A/O 4, positive acute respiratory distress, cachectic Eyes: negative scleral hemorrhage, negative anisocoria,  negative icterus Neck:  Negative scars, masses, torticollis, lymphadenopathy, JVD Lungs: Clear to auscultation bilaterally, except by basilar crackles, negative wheezes Cardiovascular: Regular rate and rhythm without murmur gallop or rub normal S1 and S2 Abdomen: negative abdominal pain, nondistended, positive soft, bowel sounds, no rebound, no ascites, no appreciable mass Extremities: No significant cyanosis, clubbing, or edema bilateral lower extremities Skin: Negative rashes, lesions, ulcers Psychiatric:  Negative depression, negative anxiety, negative fatigue, negative mania  Central nervous system:  Cranial nerves II through XII intact, tongue/uvula midline, all extremities muscle strength 5/5, sensation intact throughout, negative dysarthria, negative expressive aphasia, negative receptive aphasia.  .     Data Reviewed: Care during the described time interval was provided by me .  I have reviewed this patient's available data, including medical history, events of note, physical examination, and all test results as part of my evaluation.   CBC:  Recent Labs Lab 04/13/17 0106 04/14/17 0237 04/15/17 0232 04/16/17 0253 04/17/17 0412  WBC 16.1* 13.6* 9.5 9.2 7.5  NEUTROABS 15.1*  --   --   --   --  HGB 8.0* 8.4* 8.2* 8.5* 9.0*  HCT 24.0* 24.9* 24.4* 25.1* 27.1*  MCV 79.2 79.3 78.5 77.7* 78.3  PLT 240 219 223 229 326   Basic Metabolic Panel:  Recent Labs Lab 04/12/17 1924  04/13/17 1206 04/14/17 0237 04/15/17 0232 04/16/17 0253 04/17/17 0412 04/18/17 0357  NA 130*  < >  --  131* 131* 133* 134* 134*  K 4.1  < >  --  3.5 3.0* 3.1* 3.0* 3.1*  CL 100*  < >  --  99* 96* 96* 96* 92*  CO2 19*  < >  --  20* 21* 23 27 30   GLUCOSE 176*  < >  --  120* 144* 120* 140* 89  BUN 40*  < >  --  44* 45* 41* 36* 32*  CREATININE 3.75*  < >  --  3.76* 3.59* 3.52* 3.34* 3.04*  CALCIUM 9.1  < >  --  8.7* 8.5* 9.0 9.2 9.3  MG 1.6*  --  1.6*  --  2.0  --  1.5* 1.6*  PHOS 4.0  --  4.5 4.9*  4.6 3.9 2.9 2.5  < > = values in this interval not displayed. GFR: Estimated Creatinine Clearance: 15.8 mL/min (A) (by C-G formula based on SCr of 3.04 mg/dL (H)). Liver Function Tests:  Recent Labs Lab 04/12/17 1924 04/14/17 0237 04/15/17 0232 04/16/17 0253 04/17/17 0412 04/18/17 0357  AST 29  --   --   --   --   --   ALT 33  --   --   --   --   --   ALKPHOS 107  --   --   --   --   --   BILITOT 1.3*  --   --   --   --   --   PROT 5.8*  --   --   --   --   --   ALBUMIN 2.4* 2.1* 2.0* 2.0* 2.1* 2.2*   No results for input(s): LIPASE, AMYLASE in the last 168 hours. No results for input(s): AMMONIA in the last 168 hours. Coagulation Profile:  Recent Labs Lab 04/12/17 1924  INR 1.46   Cardiac Enzymes:  Recent Labs Lab 04/12/17 1924 04/13/17 0106 04/13/17 0634  TROPONINI 0.04* 0.04* 0.04*   BNP (last 3 results) No results for input(s): PROBNP in the last 8760 hours. HbA1C: No results for input(s): HGBA1C in the last 72 hours. CBG:  Recent Labs Lab 04/17/17 0728 04/17/17 1104 04/17/17 1350 04/17/17 1549 04/18/17 0730  GLUCAP 148* 122* 173* 171* 110*   Lipid Profile: No results for input(s): CHOL, HDL, LDLCALC, TRIG, CHOLHDL, LDLDIRECT in the last 72 hours. Thyroid Function Tests: No results for input(s): TSH, T4TOTAL, FREET4, T3FREE, THYROIDAB in the last 72 hours. Anemia Panel: No results for input(s): VITAMINB12, FOLATE, FERRITIN, TIBC, IRON, RETICCTPCT in the last 72 hours. Urine analysis:    Component Value Date/Time   COLORURINE YELLOW 03/26/2017 1539   APPEARANCEUR CLOUDY (A) 03/26/2017 1539   LABSPEC 1.010 03/26/2017 1539   PHURINE 6.5 03/26/2017 1539   GLUCOSEU NEGATIVE 03/26/2017 1539   HGBUR LARGE (A) 03/26/2017 1539   BILIRUBINUR NEGATIVE 03/26/2017 1539   KETONESUR NEGATIVE 03/26/2017 1539   PROTEINUR >300 (A) 03/26/2017 1539   NITRITE NEGATIVE 03/26/2017 1539   LEUKOCYTESUR LARGE (A) 03/26/2017 1539   Sepsis  Labs: @LABRCNTIP (procalcitonin:4,lacticidven:4)  ) Recent Results (from the past 240 hour(s))  MRSA PCR Screening     Status: None   Collection Time: 04/12/17  4:47 PM  Result Value Ref Range Status   MRSA by PCR NEGATIVE NEGATIVE Final    Comment:        The GeneXpert MRSA Assay (FDA approved for NASAL specimens only), is one component of a comprehensive MRSA colonization surveillance program. It is not intended to diagnose MRSA infection nor to guide or monitor treatment for MRSA infections.   Culture, blood (Routine X 2) w Reflex to ID Panel     Status: None   Collection Time: 04/12/17  7:23 PM  Result Value Ref Range Status   Specimen Description BLOOD RIGHT ANTECUBITAL  Final   Special Requests   Final    BOTTLES DRAWN AEROBIC AND ANAEROBIC Blood Culture adequate volume   Culture NO GROWTH 5 DAYS  Final   Report Status 04/17/2017 FINAL  Final  Culture, blood (Routine X 2) w Reflex to ID Panel     Status: None   Collection Time: 04/12/17  7:28 PM  Result Value Ref Range Status   Specimen Description BLOOD LEFT HAND  Final   Special Requests   Final    BOTTLES DRAWN AEROBIC AND ANAEROBIC Blood Culture results may not be optimal due to an excessive volume of blood received in culture bottles   Culture NO GROWTH 5 DAYS  Final   Report Status 04/17/2017 FINAL  Final         Radiology Studies: Dg Chest Port 1 View  Result Date: 04/17/2017 CLINICAL DATA:  Pneumonia. EXAM: PORTABLE CHEST 1 VIEW COMPARISON:  04/17/2015. FINDINGS: Severe cardiomegaly again noted. Persistent bilateral bilateral pulmonary infiltrates/edema most prominently upper lobes again noted without interim change. Small left pleural effusion. No pneumothorax . IMPRESSION: Severe cardiomegaly. Persistent bilateral pulmonary infiltrates/edema most prominent in the upper lobes again noted. No interim change. Small left pleural effusion . Electronically Signed   By: Marcello Moores  Register   On: 04/17/2017 06:41         Scheduled Meds: . amLODipine  5 mg Oral Daily  . aspirin EC  81 mg Oral Daily  . atorvastatin  40 mg Oral Daily  . buPROPion  300 mg Oral Daily  . busPIRone  15 mg Oral BID  . cholecalciferol  1,000 Units Oral Daily  . darbepoetin (ARANESP) injection - NON-DIALYSIS  40 mcg Subcutaneous Q Tue-1800  . docusate sodium  100 mg Oral BID  . DULoxetine  30 mg Oral Daily  . ferrous sulfate  325 mg Oral BID WC  . hydrALAZINE  100 mg Oral Q8H  . insulin aspart  0-20 Units Subcutaneous TID WC  . insulin aspart  0-5 Units Subcutaneous QHS  . isosorbide mononitrate  90 mg Oral Daily  . levothyroxine  112 mcg Oral QAC breakfast  . mouth rinse  15 mL Mouth Rinse BID  . montelukast  10 mg Oral Daily  . pantoprazole  40 mg Oral Daily  . potassium chloride  40 mEq Oral BID   Continuous Infusions: . sodium chloride 10 mL/hr at 04/17/17 2200  . furosemide Stopped (04/18/17 0615)     LOS: 6 days    Time spent: 40 minutes    Kashten Gowin, Geraldo Docker, MD [facility] Pager [phone]   If 7PM-7AM, please contact night-coverage [email or web address] Password [email or web address] 04/18/2017, 9:10 AM

## 2017-04-18 NOTE — Progress Notes (Signed)
S: Reports she is feeling better somewhat better today but was having chest pain when we entered the room on rounds.   O:BP (!) 168/87 (BP Location: Right Arm)   Pulse 65   Temp 98 F (36.7 C) (Oral)   Resp (!) 21   Wt 119 lb 4.3 oz (54.1 kg)   SpO2 96%   BMI 17.61 kg/m   Intake/Output Summary (Last 24 hours) at 04/18/17 1117 Last data filed at 04/18/17 1000  Gross per 24 hour  Intake              987 ml  Output             4900 ml  Net            -3913 ml   Intake/Output: I/O last 3 completed shifts: In: 1610 [P.O.:850; I.V.:330; IV Piggyback:364] Out: 5000 [Urine:5000]  Intake/Output this shift:  Total I/O In: 190 [P.O.:160; I.V.:30] Out: 1500 [Urine:1500] Weight change: -11 lb 11 oz (-5.3 kg) RUE:[id number] ill appearing, no acute disstress  CVS:RRR, systolic murmur  Resp: clear to auscultation  Abd: soft, non tender, non distended  Ext: no peripheral edema    Recent Labs Lab 04/12/17 1924 04/13/17 0634 04/13/17 1206 04/14/17 0237 04/15/17 0232 04/16/17 0253 04/17/17 0412 04/18/17 0357  NA 130* 132*  --  131* 131* 133* 134* 134*  K 4.1 4.0  --  3.5 3.0* 3.1* 3.0* 3.1*  CL 100* 100*  --  99* 96* 96* 96* 92*  CO2 19* 18*  --  20* 21* 23 27 30   GLUCOSE 176* 148*  --  120* 144* 120* 140* 89  BUN 40* 41*  --  44* 45* 41* 36* 32*  CREATININE 3.75* 3.71*  --  3.76* 3.59* 3.52* 3.34* 3.04*  ALBUMIN 2.4*  --   --  2.1* 2.0* 2.0* 2.1* 2.2*  CALCIUM 9.1 8.9  --  8.7* 8.5* 9.0 9.2 9.3  PHOS 4.0  --  4.5 4.9* 4.6 3.9 2.9 2.5  AST 29  --   --   --   --   --   --   --   ALT 33  --   --   --   --   --   --   --    Liver Function Tests:  Recent Labs Lab 04/12/17 1924  04/16/17 0253 04/17/17 0412 04/18/17 0357  AST 29  --   --   --   --   ALT 33  --   --   --   --   ALKPHOS 107  --   --   --   --   BILITOT 1.3*  --   --   --   --   PROT 5.8*  --   --   --   --   ALBUMIN 2.4*  < > 2.0* 2.1* 2.2*  < > = values in this interval not displayed. No results for  input(s): LIPASE, AMYLASE in the last 168 hours. No results for input(s): AMMONIA in the last 168 hours. CBC:  Recent Labs Lab 04/13/17 0106 04/14/17 0237 04/15/17 0232 04/16/17 0253 04/17/17 0412  WBC 16.1* 13.6* 9.5 9.2 7.5  NEUTROABS 15.1*  --   --   --   --   HGB 8.0* 8.4* 8.2* 8.5* 9.0*  HCT 24.0* 24.9* 24.4* 25.1* 27.1*  MCV 79.2 79.3 78.5 77.7* 78.3  PLT 240 219 223 229 265   Cardiac Enzymes:  Recent Labs Lab 04/12/17  1924 04/13/17 0106 04/13/17 0634  TROPONINI 0.04* 0.04* 0.04*   CBG:  Recent Labs Lab 04/17/17 0728 04/17/17 1104 04/17/17 1350 04/17/17 1549 04/18/17 0730  GLUCAP 148* 122* 173* 171* 110*    Iron Studies: No results for input(s): IRON, TIBC, TRANSFERRIN, FERRITIN in the last 72 hours. Studies/Results: Dg Chest Port 1 View  Result Date: 04/17/2017 CLINICAL DATA:  Pneumonia. EXAM: PORTABLE CHEST 1 VIEW COMPARISON:  04/17/2015. FINDINGS: Severe cardiomegaly again noted. Persistent bilateral bilateral pulmonary infiltrates/edema most prominently upper lobes again noted without interim change. Small left pleural effusion. No pneumothorax . IMPRESSION: Severe cardiomegaly. Persistent bilateral pulmonary infiltrates/edema most prominent in the upper lobes again noted. No interim change. Small left pleural effusion . Electronically Signed   By: Marcello Moores  Register   On: 04/17/2017 06:41   . amLODipine  5 mg Oral Daily  . aspirin EC  81 mg Oral Daily  . atorvastatin  40 mg Oral Daily  . buPROPion  300 mg Oral Daily  . busPIRone  15 mg Oral BID  . cholecalciferol  1,000 Units Oral Daily  . darbepoetin (ARANESP) injection - NON-DIALYSIS  40 mcg Subcutaneous Q Tue-1800  . docusate sodium  100 mg Oral BID  . DULoxetine  30 mg Oral Daily  . ferrous sulfate  325 mg Oral BID WC  . hydrALAZINE  100 mg Oral Q8H  . insulin aspart  0-20 Units Subcutaneous TID WC  . insulin aspart  0-5 Units Subcutaneous QHS  . isosorbide mononitrate  90 mg Oral Daily  .  levothyroxine  112 mcg Oral QAC breakfast  . mouth rinse  15 mL Mouth Rinse BID  . montelukast  10 mg Oral Daily  . pantoprazole  40 mg Oral Daily  . potassium chloride  40 mEq Oral BID    BMET    Component Value Date/Time   NA 134 (L) 04/18/2017 0357   K 3.1 (L) 04/18/2017 0357   CL 92 (L) 04/18/2017 0357   CO2 30 04/18/2017 0357   GLUCOSE 89 04/18/2017 0357   BUN 32 (H) 04/18/2017 0357   CREATININE 3.04 (H) 04/18/2017 0357   CALCIUM 9.3 04/18/2017 0357   GFRNONAA 15 (L) 04/18/2017 0357   GFRAA 17 (L) 04/18/2017 0357   CBC    Component Value Date/Time   WBC 7.5 04/17/2017 0412   RBC 3.46 (L) 04/17/2017 0412   HGB 9.0 (L) 04/17/2017 0412   HCT 27.1 (L) 04/17/2017 0412   PLT 265 04/17/2017 0412   MCV 78.3 04/17/2017 0412   MCH 26.0 04/17/2017 0412   MCHC 33.2 04/17/2017 0412   RDW 17.3 (H) 04/17/2017 0412   LYMPHSABS 0.4 (L) 04/13/2017 0106   MONOABS 0.6 04/13/2017 0106   EOSABS 0.0 04/13/2017 0106   BASOSABS 0.0 04/13/2017 0106     Assessment/Plan:  1. AKI/ CKD stage 4 2/2 cardiorenal syndrome with decompensated CHF due to lack of diuretic therapy- Creatinine improving and she continues to have good UOP.  - Continue Lasix 160 mg every 8 hourly.  2. Chest pain - nurse giving nitro now and will notify the primary   3. Acute on chronic diastolic CHF- Volume improving with good UOP. Cardiology is following  4. Acute hypoxic respiratory failure - HFNC, weaning as tolerated, CCM is following   4. Anemia. Hemoglobin Little improved,it was 9.0 today. - Continue iron supplement.  5. HTN. Blood pressure remains elevated   - increase to amlodipine 10 mg today, this was her home dose  - continue hydralazine  100 mg q8h, Imdur, and lasix   Ledell Noss, MD   I have seen and examined this patient and agree with plan and assessment in the above note with renal recommendations/intervention highlighted.  She has had a brisk diuresis so will decrease lasix to bid to prevent  another episode of AKI/CKD. Governor Rooks Albertha Beattie,MD 04/18/2017 12:09 PM   Donetta Potts, MD [facility] [phone]

## 2017-04-18 NOTE — Progress Notes (Signed)
Progress Note  Patient Name: Jocelyn Hill. Leroy Date of Encounter: 04/18/2017  Primary Cardiologist: Dr. [name]  Subjective   Feels weak, had some chest discomfort earlier this morning present resolved. Appetite is fair. No abdominal pain.  Inpatient Medications    Scheduled Meds: . [START ON 04/19/2017] amLODipine  10 mg Oral Daily  . aspirin EC  81 mg Oral Daily  . atorvastatin  40 mg Oral Daily  . buPROPion  300 mg Oral Daily  . busPIRone  15 mg Oral BID  . cholecalciferol  1,000 Units Oral Daily  . darbepoetin (ARANESP) injection - NON-DIALYSIS  40 mcg Subcutaneous Q Tue-1800  . docusate sodium  100 mg Oral BID  . DULoxetine  30 mg Oral Daily  . ferrous sulfate  325 mg Oral BID WC  . hydrALAZINE  100 mg Oral Q8H  . insulin aspart  0-20 Units Subcutaneous TID WC  . insulin aspart  0-5 Units Subcutaneous QHS  . isosorbide mononitrate  90 mg Oral Daily  . levothyroxine  112 mcg Oral QAC breakfast  . mouth rinse  15 mL Mouth Rinse BID  . montelukast  10 mg Oral Daily  . pantoprazole  40 mg Oral Daily  . potassium chloride  40 mEq Oral BID   Continuous Infusions: . sodium chloride 10 mL/hr at 04/18/17 1200  . furosemide     PRN Meds: acetaminophen, acetaminophen-codeine, albuterol, bisacodyl, morphine injection, nitroGLYCERIN, ondansetron (ZOFRAN) IV   Vital Signs    Vitals:   04/18/17 0948 04/18/17 1013 04/18/17 1100 04/18/17 1128  BP:      Pulse:   85   Resp:   15   Temp:    98.2 F (36.8 C)  TempSrc:    Oral  SpO2: 96%  98%   Weight:  119 lb 4.3 oz (54.1 kg)      Intake/Output Summary (Last 24 hours) at 04/18/17 1412 Last data filed at 04/18/17 1200  Gross per 24 hour  Intake              917 ml  Output             4650 ml  Net            -3733 ml   Filed Weights   04/17/17 0500 04/18/17 0500 04/18/17 1013  Weight: 130 lb 15.3 oz (59.4 kg) 119 lb 4.3 oz (54.1 kg) 119 lb 4.3 oz (54.1 kg)    Telemetry    Sinus rhythm. Personally  reviewed.  Physical Exam   GEN: Thin, chronically ill-appearing woman. No distress.  Neck: No JVD. Cardiac: RRR, 2/6 apical systolic murmur, no gallop. Respiratory:  Diminished breath sounds without wheezing. GI: Soft, nontender, bowel sounds present. MS: No edema; No deformity. Neuro:  Nonfocal.  Labs    Chemistry Recent Labs Lab 04/12/17 1924  04/16/17 0253 04/17/17 0412 04/18/17 0357  NA 130*  < > 133* 134* 134*  K 4.1  < > 3.1* 3.0* 3.1*  CL 100*  < > 96* 96* 92*  CO2 19*  < > 23 27 30   GLUCOSE 176*  < > 120* 140* 89  BUN 40*  < > 41* 36* 32*  CREATININE 3.75*  < > 3.52* 3.34* 3.04*  CALCIUM 9.1  < > 9.0 9.2 9.3  PROT 5.8*  --   --   --   --   ALBUMIN 2.4*  < > 2.0* 2.1* 2.2*  AST 29  --   --   --   --  ALT 33  --   --   --   --   ALKPHOS 107  --   --   --   --   BILITOT 1.3*  --   --   --   --   GFRNONAA 12*  < > 13* 13* 15*  GFRAA 14*  < > 15* 16* 17*  ANIONGAP 11  < > 14 11 12   < > = values in this interval not displayed.   Hematology Recent Labs Lab 04/15/17 0232 04/16/17 0253 04/17/17 0412  WBC 9.5 9.2 7.5  RBC 3.11* 3.23* 3.46*  HGB 8.2* 8.5* 9.0*  HCT 24.4* 25.1* 27.1*  MCV 78.5 77.7* 78.3  MCH 26.4 26.3 26.0  MCHC 33.6 33.9 33.2  RDW 17.3* 17.2* 17.3*  PLT 223 229 265    Cardiac Enzymes Recent Labs Lab 04/12/17 1924 04/13/17 0106 04/13/17 0634  TROPONINI 0.04* 0.04* 0.04*   No results for input(s): TROPIPOC in the last 168 hours.    Radiology    Dg Chest Port 1 View  Result Date: 04/17/2017 CLINICAL DATA:  Pneumonia. EXAM: PORTABLE CHEST 1 VIEW COMPARISON:  04/17/2015. FINDINGS: Severe cardiomegaly again noted. Persistent bilateral bilateral pulmonary infiltrates/edema most prominently upper lobes again noted without interim change. Small left pleural effusion. No pneumothorax . IMPRESSION: Severe cardiomegaly. Persistent bilateral pulmonary infiltrates/edema most prominent in the upper lobes again noted. No interim change. Small  left pleural effusion . Electronically Signed   By: Marcello Moores  Register   On: 04/17/2017 06:41    Cardiac Studies   TEE 03/17/2017: Study Conclusions  - Left ventricle: Systolic function was normal. The estimated   ejection fraction was in the range of 55% to 60%. Wall motion was   normal; there were no regional wall motion abnormalities. - Aortic valve: No evidence of vegetation. There was trivial   regurgitation. - Mitral valve: Mobility of the anterior and posterior leaflet was   severely restricted. There was severe regurgitation. - Left atrium: The atrium was mildly dilated. No evidence of   thrombus in the atrial cavity or appendage. - Right atrium: No evidence of thrombus in the atrial cavity or   appendage. - Atrial septum: No defect or patent foramen ovale was identified. - Tricuspid valve: No evidence of vegetation. There was   mild-moderate regurgitation. - Pulmonic valve: No evidence of vegetation.  Impressions:  - Normal LV systolic function; mild LAE; restricted anterior and   posterior MV leaflets with severe MR; mild to moderate TR.  Patient Profile     66 y.o. 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  Assessment & Plan    1. Acute hypoxic respiratory failure, back on nasal cannula today.  2. Acute on chronic diastolic heart failure, located by severe mitral regurgitation. She continues to diurese on high-dose IV Lasix with slowly improving renal function.  3. Acute on chronic renal failure with CKD stage IV at baseline. Creatinine down to 3.0. She remains on Lasix 160 mg IV, reduced to twice daily today and has follow-up per nephrology.  4. CAD status post myocardial infarctions in 2012 in 2013. She did have chest pain earlier this  morning, currently stable. Follow-up ECG will be obtained. Plan to continue medical therapy for now with high risk of contrast nephropathy with cardiac catheterization.  5. 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 Poor surgical candidate based on assessment by Dr. Roxy Manns.  6. Previous retroperitoneal bleed in September, not anticoagulated at this point.  7. Paroxysmal atrial fibrillation, maintaining sinus rhythm, amiodarone discontinued due to junctional rhythm.  8. Essential hypertension, continuing to adjust medications.  Continue aspirin, Norvasc, Lipitor, hydralazine, Imdur, high-dose IV Lasix and potassium supplements. No beta blocker at this point with recent junctional rhythm necessitating discontinuation of amiodarone. Continue to replete potassium. Add magnesium supplement.  Signed, Rozann Lesches, MD  04/18/2017, 2:12 PM

## 2017-04-19 ENCOUNTER — Inpatient Hospital Stay (HOSPITAL_COMMUNITY): Payer: Medicare Other

## 2017-04-19 DIAGNOSIS — I2510 Atherosclerotic heart disease of native coronary artery without angina pectoris: Secondary | ICD-10-CM

## 2017-04-19 DIAGNOSIS — I110 Hypertensive heart disease with heart failure: Secondary | ICD-10-CM

## 2017-04-19 DIAGNOSIS — E1165 Type 2 diabetes mellitus with hyperglycemia: Secondary | ICD-10-CM

## 2017-04-19 DIAGNOSIS — E876 Hypokalemia: Secondary | ICD-10-CM

## 2017-04-19 DIAGNOSIS — E1129 Type 2 diabetes mellitus with other diabetic kidney complication: Secondary | ICD-10-CM

## 2017-04-19 DIAGNOSIS — I483 Typical atrial flutter: Secondary | ICD-10-CM

## 2017-04-19 LAB — GLUCOSE, CAPILLARY
Glucose-Capillary: 78 mg/dL (ref 65–99)
Glucose-Capillary: 142 mg/dL — ABNORMAL HIGH (ref 65–99)
Glucose-Capillary: 202 mg/dL — ABNORMAL HIGH (ref 65–99)
Glucose-Capillary: 136 mg/dL — ABNORMAL HIGH (ref 65–99)
Glucose-Capillary: 132 mg/dL — ABNORMAL HIGH (ref 65–99)

## 2017-04-19 LAB — RENAL FUNCTION PANEL
Albumin: 2.3 g/dL — ABNORMAL LOW (ref 3.5–5.0)
Anion gap: 13 (ref 5–15)
BUN: 28 mg/dL — ABNORMAL HIGH (ref 6–20)
CO2: 29 mmol/L (ref 22–32)
Calcium: 9.6 mg/dL (ref 8.9–10.3)
Chloride: 93 mmol/L — ABNORMAL LOW (ref 101–111)
Creatinine, Ser: 2.83 mg/dL — ABNORMAL HIGH (ref 0.44–1.00)
GFR calc Af Amer: 19 mL/min — ABNORMAL LOW (ref >60)
GFR calc non Af Amer: 16 mL/min — ABNORMAL LOW (ref >60)
Glucose, Bld: 84 mg/dL (ref 65–99)
Phosphorus: 2.7 mg/dL (ref 2.5–4.6)
Potassium: 4.4 mmol/L (ref 3.5–5.1)
Sodium: 135 mmol/L (ref 135–145)

## 2017-04-19 LAB — TROPONIN I: Troponin I: 0.14 ng/mL (ref <0.03)

## 2017-04-19 LAB — MAGNESIUM: Magnesium: 1.5 mg/dL — ABNORMAL LOW (ref 1.7–2.4)

## 2017-04-19 MED ORDER — MAGNESIUM SULFATE 2 GM/50ML IV SOLN
2.0000 g | Freq: Once | INTRAVENOUS | Status: AC
  Administered 2017-04-19: 2 g via INTRAVENOUS
  Filled 2017-04-19: qty 50

## 2017-04-19 MED ORDER — ISOSORBIDE MONONITRATE ER 30 MG PO TB24
30.0000 mg | ORAL_TABLET | Freq: Once | ORAL | Status: AC
  Administered 2017-04-19: 30 mg via ORAL
  Filled 2017-04-19: qty 1

## 2017-04-19 MED ORDER — LIDOCAINE 5 % EX PTCH
1.0000 | MEDICATED_PATCH | CUTANEOUS | Status: DC
  Administered 2017-04-20 – 2017-04-23 (×4): 1 via TRANSDERMAL
  Filled 2017-04-19 (×5): qty 1

## 2017-04-19 MED ORDER — NITROGLYCERIN IN D5W 200-5 MCG/ML-% IV SOLN
2.0000 ug/min | INTRAVENOUS | Status: DC
  Administered 2017-04-19: 5 ug/min via INTRAVENOUS
  Filled 2017-04-19 (×2): qty 250

## 2017-04-19 MED ORDER — HYDRALAZINE HCL 20 MG/ML IJ SOLN
10.0000 mg | Freq: Once | INTRAMUSCULAR | Status: AC
  Administered 2017-04-19: 10 mg via INTRAVENOUS

## 2017-04-19 MED ORDER — HYDRALAZINE HCL 20 MG/ML IJ SOLN
INTRAMUSCULAR | Status: AC
  Filled 2017-04-19: qty 1

## 2017-04-19 MED ORDER — CARVEDILOL 3.125 MG PO TABS
3.1250 mg | ORAL_TABLET | Freq: Two times a day (BID) | ORAL | Status: DC
  Administered 2017-04-19: 3.125 mg via ORAL
  Filled 2017-04-19 (×2): qty 1

## 2017-04-19 MED ORDER — ISOSORBIDE MONONITRATE ER 60 MG PO TB24
120.0000 mg | ORAL_TABLET | Freq: Every day | ORAL | Status: DC
  Administered 2017-04-20 – 2017-04-23 (×4): 120 mg via ORAL
  Filled 2017-04-19 (×4): qty 2

## 2017-04-19 MED ORDER — POTASSIUM CHLORIDE CRYS ER 10 MEQ PO TBCR
20.0000 meq | EXTENDED_RELEASE_TABLET | Freq: Once | ORAL | Status: AC
  Administered 2017-04-19: 20 meq via ORAL
  Filled 2017-04-19: qty 2

## 2017-04-19 NOTE — Progress Notes (Signed)
2015 Pt c/o of chest pain 4/10. Pt given sublingual Nitro given per orders and morphine per chest pain orders. Pt heart rhythm changed and EKG obtained and rhythm was noted to be in a-flutter 140's. PA was paged and made aware of the situation and Cards fellow was also made aware of the situation. Rapid responds was notified and responded to bedside.  2115 Pt converted heart rate to SR 2118 Pt converted back to A-flutter pt stated that chest pain has decreased to 3/4 2120 Pt converted Heart rated back to SR with frequent PVC's and stated chest pain had resolved. NP and Cards fellow both made aware. Will continue to monitor pt

## 2017-04-19 NOTE — Progress Notes (Signed)
S: Reports that she is feeling well today, she has moved out of the ICU to step down.   O:BP (!) 145/79   Pulse 77   Temp 98.6 F (37 C) (Oral)   Resp 18   Ht 5\' 9"  (1.753 m)   Wt 118 lb 2.7 oz (53.6 kg)   SpO2 97%   BMI 17.45 kg/m   Intake/Output Summary (Last 24 hours) at 04/19/17 1217 Last data filed at 04/19/17 1200  Gross per 24 hour  Intake          1100.98 ml  Output             1500 ml  Net          -399.02 ml   Intake/Output: I/O last 3 completed shifts: In: 1159.5 [P.O.:620; I.V.:341.5; IV Piggyback:198] Out: 5000 [Urine:5000]  Intake/Output this shift:  Total I/O In: 463.5 [P.O.:300; I.V.:163.5] Out: 400 [Urine:400] Weight change: 0 lb (0 kg) Gen:no acute distress  CVS: RRR, systolic murmur  Resp: lungs sound clear  Abd: soft, non tender, non distended  Ext: no peripheral edema    Recent Labs Lab 04/12/17 1924  04/13/17 1206 04/14/17 0237 04/15/17 0232 04/16/17 0253 04/17/17 0412 04/18/17 0357 04/18/17 2251 04/19/17 0218  NA 130*  < >  --  131* 131* 133* 134* 134* 133* 135  K 4.1  < >  --  3.5 3.0* 3.1* 3.0* 3.1* 3.6 4.4  CL 100*  < >  --  99* 96* 96* 96* 92* 91* 93*  CO2 19*  < >  --  20* 21* 23 27 30 29 29   GLUCOSE 176*  < >  --  120* 144* 120* 140* 89 111* 84  BUN 40*  < >  --  44* 45* 41* 36* 32* 31* 28*  CREATININE 3.75*  < >  --  3.76* 3.59* 3.52* 3.34* 3.04* 3.51* 2.83*  ALBUMIN 2.4*  --   --  2.1* 2.0* 2.0* 2.1* 2.2*  --  2.3*  CALCIUM 9.1  < >  --  8.7* 8.5* 9.0 9.2 9.3 9.5 9.6  PHOS 4.0  --  4.5 4.9* 4.6 3.9 2.9 2.5  --  2.7  AST 29  --   --   --   --   --   --   --   --   --   ALT 33  --   --   --   --   --   --   --   --   --   < > = values in this interval not displayed. Liver Function Tests:  Recent Labs Lab 04/12/17 1924  04/17/17 0412 04/18/17 0357 04/19/17 0218  AST 29  --   --   --   --   ALT 33  --   --   --   --   ALKPHOS 107  --   --   --   --   BILITOT 1.3*  --   --   --   --   PROT 5.8*  --   --   --   --    ALBUMIN 2.4*  < > 2.1* 2.2* 2.3*  < > = values in this interval not displayed. No results for input(s): LIPASE, AMYLASE in the last 168 hours. No results for input(s): AMMONIA in the last 168 hours. CBC:  Recent Labs Lab 04/13/17 0106 04/14/17 0237 04/15/17 0232 04/16/17 0253 04/17/17 0412 04/18/17 2251  WBC 16.1* 13.6* 9.5  9.2 7.5 7.8  NEUTROABS 15.1*  --   --   --   --  6.1  HGB 8.0* 8.4* 8.2* 8.5* 9.0* 10.7*  HCT 24.0* 24.9* 24.4* 25.1* 27.1* 32.3*  MCV 79.2 79.3 78.5 77.7* 78.3 80.0  PLT 240 219 223 229 265 301   Cardiac Enzymes:  Recent Labs Lab 04/12/17 1924 04/13/17 0106 04/13/17 0634 04/18/17 2251 04/19/17 0527  TROPONINI 0.04* 0.04* 0.04* 0.12* 0.14*   CBG:  Recent Labs Lab 04/18/17 1130 04/18/17 1552 04/18/17 2213 04/19/17 0809 04/19/17 1200  GLUCAP 225* 147* 113* 142* 202*    Iron Studies: No results for input(s): IRON, TIBC, TRANSFERRIN, FERRITIN in the last 72 hours. Studies/Results: Dg Chest Port 1 View  Result Date: 04/19/2017 CLINICAL DATA:  Chest pain tonight EXAM: PORTABLE CHEST 1 VIEW COMPARISON:  04/17/2017 FINDINGS: Cardiac enlargement with vascular congestion. Perihilar infiltrates likely represent edema. Can't exclude superimposed pneumonia in the upper lobes. Infiltrates are less prominent than on previous study suggesting some interval clearing. No blunting of costophrenic angles. No pneumothorax. Calcification of the aorta. Mediastinal contours appear intact. IMPRESSION: Cardiac enlargement with pulmonary vascular congestion and perihilar edema and/or pneumonia, with some improvement of perihilar infiltrates since previous study. Aortic atherosclerosis. Electronically Signed   By: Lucienne Capers M.D.   On: 04/19/2017 05:37   . amLODipine  10 mg Oral Daily  . aspirin EC  81 mg Oral Daily  . atorvastatin  40 mg Oral Daily  . buPROPion  300 mg Oral Daily  . busPIRone  15 mg Oral BID  . carvedilol  3.125 mg Oral BID WC  .  cholecalciferol  1,000 Units Oral Daily  . darbepoetin (ARANESP) injection - NON-DIALYSIS  40 mcg Subcutaneous Q Tue-1800  . docusate sodium  100 mg Oral BID  . DULoxetine  30 mg Oral Daily  . ferrous sulfate  325 mg Oral BID WC  . hydrALAZINE  100 mg Oral Q8H  . insulin aspart  0-20 Units Subcutaneous TID WC  . insulin aspart  0-5 Units Subcutaneous QHS  . [START ON 04/20/2017] isosorbide mononitrate  120 mg Oral Daily  . levothyroxine  112 mcg Oral QAC breakfast  . lidocaine  1 patch Transdermal Q24H  . magnesium gluconate  250 mg Oral Daily  . mouth rinse  15 mL Mouth Rinse BID  . montelukast  10 mg Oral Daily  . pantoprazole  40 mg Oral Daily    BMET    Component Value Date/Time   NA 135 04/19/2017 0218   K 4.4 04/19/2017 0218   CL 93 (L) 04/19/2017 0218   CO2 29 04/19/2017 0218   GLUCOSE 84 04/19/2017 0218   BUN 28 (H) 04/19/2017 0218   CREATININE 2.83 (H) 04/19/2017 0218   CALCIUM 9.6 04/19/2017 0218   GFRNONAA 16 (L) 04/19/2017 0218   GFRAA 19 (L) 04/19/2017 0218   CBC    Component Value Date/Time   WBC 7.8 04/18/2017 2251   RBC 4.04 04/18/2017 2251   HGB 10.7 (L) 04/18/2017 2251   HCT 32.3 (L) 04/18/2017 2251   PLT 301 04/18/2017 2251   MCV 80.0 04/18/2017 2251   MCH 26.5 04/18/2017 2251   MCHC 33.1 04/18/2017 2251   RDW 18.5 (H) 04/18/2017 2251   LYMPHSABS 0.8 04/18/2017 2251   MONOABS 0.6 04/18/2017 2251   EOSABS 0.2 04/18/2017 2251   BASOSABS 0.1 04/18/2017 2251     Assessment/Plan:  1. AKI/ CKD stage 4 2/2 cardiorenal syndrome with decompensated CHF due to  lack of diuretic therapy - Creatinine continue to improve today and she continues to have good UOP.  -  IV Lasix 160 mg decreased to BID yesterday, shes continued to have good urine output ( net 2 liters out yesterday )   2. Chest pain - She went into atrial flutter yesterday then converted back to NSR with frequent PVCs. Cardiology added carvedilol today.   3. Acute on chronic diastolic CHF-  Volume improving with good UOP.  4. Acute hypoxic respiratory failure - weaning HFNC as tolerated  4. Anemia. Hemoglobin was improved to 10.7 yesterday  - Continue iron supplement.  5. HTN. Blood pressure is better controlled today  -  amlodipine 10 mg today increased to her home dose, carvedilol added today  - continue hydralazine 100 mg q8h, Imdur, and lasix   Ledell Noss, MD   I have seen and examined this patient and agree with plan and assessment in the above note with renal recommendations/intervention highlighted.  Baseline Scr of 2.1 reportedly at [facility] prior to her admission in September, however she has been seen by Dr. Hollie Salk in our office in July and her Scr was 2.98.  She is responding to IV lasix and Scr has continued to improve, however she is having ongoing chest pain.  Cardiology following and is on beta-blocker and ntg.  Not strong enough for MVR per CT surgery.  She has not improved much from functional status and will likely need SNF placement again once stable for d/c.  She appears to be at her baseline.  Continue with current regimen for now and increase mobility with PT as tolerated.     Governor Rooks Naja Apperson,MD 04/19/2017 1:50 PM   Donetta Potts, MD [facility] [phone]

## 2017-04-19 NOTE — Progress Notes (Signed)
RN called about seeing patient ("second set of eyes"), patient was in the [place] and then converted to AFlutter in the 120s-130s, patient was having chest pressure, RN had called TRN  APP and Cards Fellow, patient did receive NTG SL x 3, morphine 2mg  IV, RN had received orders for BP (hydralazine), I went back to assess another another, I came back in 15 minutes, HR improved to 80 SR and chest pressure eased when I arrived the second time    Around 3:30, patient's hypertension came back, chest pressure came back 8/10, NTG drip was started, increased to 15mcg, 2mg  morphine IV, and chest pressure was still 8/10.  HR 70-80s (frequent PVCs).  Breathing was diminished, but not in distress.    I asked RN to call Cards Fellow, RN did, will follow.  We did reposition the patient and applied heat for comfort. Fellow ordered Troponin and Lidoderm, I ordered a chest xray, per RN patient's breathing is more shallow now, sats are maintaining >95% on 1L.  Will follow.   End Time 530

## 2017-04-19 NOTE — Progress Notes (Signed)
Progress Note  Patient Name: Jocelyn Hill. Alipio Date of Encounter: 04/19/2017  Primary Cardiologist: Dr. [name]   Subjective   Reports 6 out of 10 substernal chest pressure.  When in atrial flutter she had 8 out of 10 chest pressure.  Shortness of breath is much better.  Inpatient Medications    Scheduled Meds: . amLODipine  10 mg Oral Daily  . aspirin EC  81 mg Oral Daily  . atorvastatin  40 mg Oral Daily  . buPROPion  300 mg Oral Daily  . busPIRone  15 mg Oral BID  . cholecalciferol  1,000 Units Oral Daily  . darbepoetin (ARANESP) injection - NON-DIALYSIS  40 mcg Subcutaneous Q Tue-1800  . docusate sodium  100 mg Oral BID  . DULoxetine  30 mg Oral Daily  . ferrous sulfate  325 mg Oral BID WC  . hydrALAZINE  100 mg Oral Q8H  . insulin aspart  0-20 Units Subcutaneous TID WC  . insulin aspart  0-5 Units Subcutaneous QHS  . isosorbide mononitrate  90 mg Oral Daily  . levothyroxine  112 mcg Oral QAC breakfast  . lidocaine  1 patch Transdermal Q24H  . magnesium gluconate  250 mg Oral Daily  . mouth rinse  15 mL Mouth Rinse BID  . montelukast  10 mg Oral Daily  . pantoprazole  40 mg Oral Daily   Continuous Infusions: . sodium chloride 10 mL/hr at 04/18/17 1700  . furosemide Stopped (04/19/17 5277)  . nitroGLYCERIN 75 mcg/min (04/19/17 0800)   PRN Meds: acetaminophen, acetaminophen-codeine, albuterol, bisacodyl, morphine injection, nitroGLYCERIN, ondansetron (ZOFRAN) IV   Vital Signs    Vitals:   04/19/17 0615 04/19/17 0630 04/19/17 0645 04/19/17 0800  BP: (!) 160/97 (!) 159/96 (!) 169/95 (!) 140/93  Pulse: 80 80 83 80  Resp: 13 13 16 14   Temp:    98.6 F (37 C)  TempSrc:    Oral  SpO2: 94% 95% 94% 96%  Weight:      Height:        Intake/Output Summary (Last 24 hours) at 04/19/17 1049 Last data filed at 04/19/17 0800  Gross per 24 hour  Intake           730.98 ml  Output             1900 ml  Net         -1169.02 ml   Filed Weights   04/18/17 0500  04/18/17 1013 04/19/17 0325  Weight: 54.1 kg (119 lb 4.3 oz) 54.1 kg (119 lb 4.3 oz) 53.6 kg (118 lb 2.7 oz)    Telemetry    Sinus rhythm.  Multiform PVCs.  Ventricular bigeminy.  Episode of atrial flutter overnight with ventricular rates in the 140s.- Personally Reviewed  ECG    n/a - Personally Reviewed  Physical Exam   VS:  BP (!) 140/93   Pulse 80   Temp 98.6 F (37 C) (Oral)   Resp 14   Ht 5\' 9"  (1.753 m)   Wt 53.6 kg (118 lb 2.7 oz)   SpO2 96%   BMI 17.45 kg/m  , BMI Body mass index is 17.45 kg/m. GENERAL: Chronically ill-appearing.  Frail.  No acute distress. HEENT: Pupils equal round and reactive, fundi not visualized, oral mucosa unremarkable NECK:  JVD 1cm above clavicle at 45 degrees.  waveform within normal limits, carotid upstroke brisk and symmetric, no bruits, no thyromegaly LUNGS:  Clear to auscultation bilaterally HEART:  RRR.  PMI not displaced  or sustained,S1 and S2 within normal limits, no S3, no S4, no clicks, no rubs, II/VI apical holosystolic murmurs ABD:  Flat, positive bowel sounds normal in frequency in pitch, no bruits, no rebound, no guarding, no midline pulsatile mass, no hepatomegaly, no splenomegaly EXT:  2 plus pulses throughout, no edema, no cyanosis no clubbing SKIN:  No rashes no nodules NEURO:  Cranial nerves II through XII grossly intact, motor grossly intact throughout [facility]:  Cognitively intact, oriented to person place and time   Labs    Chemistry Recent Labs Lab 04/12/17 1924  04/17/17 0412 04/18/17 0357 04/18/17 2251 04/19/17 0218  NA 130*  < > 134* 134* 133* 135  K 4.1  < > 3.0* 3.1* 3.6 4.4  CL 100*  < > 96* 92* 91* 93*  CO2 19*  < > 27 30 29 29   GLUCOSE 176*  < > 140* 89 111* 84  BUN 40*  < > 36* 32* 31* 28*  CREATININE 3.75*  < > 3.34* 3.04* 3.51* 2.83*  CALCIUM 9.1  < > 9.2 9.3 9.5 9.6  PROT 5.8*  --   --   --   --   --   ALBUMIN 2.4*  < > 2.1* 2.2*  --  2.3*  AST 29  --   --   --   --   --   ALT 33  --   --   --    --   --   ALKPHOS 107  --   --   --   --   --   BILITOT 1.3*  --   --   --   --   --   GFRNONAA 12*  < > 13* 15* 13* 16*  GFRAA 14*  < > 16* 17* 15* 19*  ANIONGAP 11  < > 11 12 13 13   < > = values in this interval not displayed.   Hematology Recent Labs Lab 04/16/17 0253 04/17/17 0412 04/18/17 2251  WBC 9.2 7.5 7.8  RBC 3.23* 3.46* 4.04  HGB 8.5* 9.0* 10.7*  HCT 25.1* 27.1* 32.3*  MCV 77.7* 78.3 80.0  MCH 26.3 26.0 26.5  MCHC 33.9 33.2 33.1  RDW 17.2* 17.3* 18.5*  PLT 229 265 301    Cardiac Enzymes Recent Labs Lab 04/13/17 0106 04/13/17 0634 04/18/17 2251 04/19/17 0527  TROPONINI 0.04* 0.04* 0.12* 0.14*   No results for input(s): TROPIPOC in the last 168 hours.   BNPNo results for input(s): BNP, PROBNP in the last 168 hours.   DDimer No results for input(s): DDIMER in the last 168 hours.   Radiology    Dg Chest Port 1 View  Result Date: 04/19/2017 CLINICAL DATA:  Chest pain tonight EXAM: PORTABLE CHEST 1 VIEW COMPARISON:  04/17/2017 FINDINGS: Cardiac enlargement with vascular congestion. Perihilar infiltrates likely represent edema. Can't exclude superimposed pneumonia in the upper lobes. Infiltrates are less prominent than on previous study suggesting some interval clearing. No blunting of costophrenic angles. No pneumothorax. Calcification of the aorta. Mediastinal contours appear intact. IMPRESSION: Cardiac enlargement with pulmonary vascular congestion and perihilar edema and/or pneumonia, with some improvement of perihilar infiltrates since previous study. Aortic atherosclerosis. Electronically Signed   By: Lucienne Capers M.D.   On: 04/19/2017 05:37    Cardiac Studies   TEE 03/17/2017: Study Conclusions  - Left ventricle: Systolic function was normal. The estimated ejection fraction was in the range of 55% to 60%. Wall motion was normal; there were no regional wall motion abnormalities. -  Aortic valve: No evidence of vegetation. There was  trivial regurgitation. - Mitral valve: Mobility of the anterior and posterior leaflet was severely restricted. There was severe regurgitation. - Left atrium: The atrium was mildly dilated. No evidence of thrombus in the atrial cavity or appendage. - Right atrium: No evidence of thrombus in the atrial cavity or appendage. - Atrial septum: No defect or patent foramen ovale was identified. - Tricuspid valve: No evidence of vegetation. There was mild-moderate regurgitation. - Pulmonic valve: No evidence of vegetation.  Impressions:  - Normal LV systolic function; mild LAE; restricted anterior and posterior MV leaflets with severe MR; mild to moderate TR.  Patient Profile     66 y.o. 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  He had an acute exacerbation overnight while in atrial flutter with rapid ventricular response.  Continue diuresis per nephrology.  # Paroxysmal atrial fibrillation: # Retroperitoneal bleed: 02/2017.   Overnight Mr. Klugh went back into atrial flutter at the 120s-130s with associated chest pressure.  She was also hypertensive to the 160/170s.  She received morphine and was started on a nitroglycerin drip.  Her heart rate converted to sinus rhythm with frequent PVCs.  Amiodarone has been held 2/2 junctional rhythm.  She is not on anticoagulation 2/2 RP bleed.  We will try adding carvedilol 3.125mg  bid and monitor her heart rhythm closely.  #Acute on chronic diastolic heart failure: #Severe mitral regurgitation: Ms. Colgate  has severe mitral regurgitation due to rheumatic mitral valve disease and restricted mitral valve leaflets.  She is not a candidate for surgery at this time due to  comorbidities and deconditioning.  Start carvedilol as above.    #CAD status post MI: Troponin is very mildly elevated at 0.13.  This is likely in the setting of demand ischemia with atrial flutter with rapid ventricular response.  She would need a heart catheterization prior to any valve surgery.  However we would like to avoid this in the setting of acute on chronic renal failure and active diuresis.  Prior MI in 2012 and 2013.  No plans for left heart catheterization due to acute on chronic renal failure.  We will attempt to wean IV nitroglycerin.  Increase Imdur to 120mg  daily.    # CKD IV:  Improving with diuresis.  L kidney atrophic.    # Hypertension:  Amlodipine was increased 10/20.  Blood pressure remains above goal.  Weaning nitroglycerin drip and increasing Imdur as above. Start carvedilol as above.  If her blood pressure remains elevated heart rhythm tolerated we can titrate carvedilol.  If not, consider starting doxazosin.    For questions or updates, please contact [place] Please consult [email or web address] for contact info under Cardiology/STEMI.      Signed, Skeet Latch, MD  04/19/2017, 10:49 AM

## 2017-04-19 NOTE — Progress Notes (Signed)
0315 Pt is c/o chest pain 8/10 Pt bp is elevated to SBP 160-170's and DBP 90-106. Heart rate is 80's SR with frequent PVC's Cards fellow was notified and gave orders to start pt on Nitro gtt. Rapid responds was also notified of the situation.  Will continue to implement the orders given and monitor pt.

## 2017-04-19 NOTE — Progress Notes (Signed)
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 During this hospitalization she was diuresed over 26 L her weight went from 143 pounds on admission down to 123 she was deemed euvolemic upon time of discharge. Following discharge she was discharged to a skilled nursing facility with the plan to receive aggressive rehabilitation, and eventually be seen by dental medicine for dental extractions in hopes of eventually undergoing mitral valve surgery should she recover to the point thoracic surgery team for a potential candidate. Her diuretics were stopped upon time of admission to the nursing home, possibly 3 days prior to presentation at [facility] emergency rooms he began to notice increased shortness of breath, a cough with some blood in her sputum, increased weakness, increased swelling, and her lower extremities as well as increase in frequency of chest pain primarily substernal. In the emergency room she was found to have saturations in the 70s chest x-ray consistent with pulmonary edema per report 12-lead showing atrial fibrillation with prolonged QT as well as BNP of over 30,000. Given her prolonged recent hospital stay, and multiple comorbidities as well as prior workup done here, on admission to our intensive care was requested.       Subjective: 10/21 A/O 4, positive CT rated 5/10 (described as someone sitting on her chest),negative radiation, positive SOB, negative N/V, negative abdominal pain   Assessment & Plan:   Active Problems:   Acute heart failure ([place])   Acute pulmonary edema ([place])  HCAP? -questionable pneumonia complete 7 days of Zosyn  Acute respiratory failure with hypoxia -Diffuse pulmonary infiltrates most likely secondary to decompensated diastolic CHF with pulmonary edema. -Titrate O2 to maintain SPO2> 93%  Acute on Chronic Diastolic CHF -Strict in and out since admission -8.0 L -Daily weight Filed Weights   04/18/17 0500 04/18/17 1013 04/19/17 0325  Weight: 119 lb 4.3 oz (54.1 kg) 119 lb 4.3 oz (54.1 kg) 118 lb 2.7 oz (53.6 kg)  -Aggressive diuresis Lasix 160 mg TID -Amlodipine 10 mg daily -ASA 81 mg daily -Coreg 3.125 mg BID -Lasix drip -Hydralazine 100 mg TID -Imdur 120 mg daily -NTG drip -transfuse for hemoglobin< 8 -consult to nutrition for education on heart healthy diet/low sodium diet  Acute Pulmonary edema -continue aggressive diuresis see CHF  Paroxysmal atrial fibrillation  -not on anticoagulation secondary to recent retroperitoneal bleed. -rate controled  CAD/status post MI (last admission) See CHF  Elevated troponin/Chest Pain -Slightly elevated troponin   Recent Labs Lab 04/12/17 1924 04/13/17 0106 04/13/17 0634 04/18/17 2251 04/19/17 0527  TROPONINI 0.04* 0.04* 0.04* 0.12* 0.14*  -not fully convinced totally cardiac in nature(component of demand ischemia?). On exam a portion of the chest pain is reproducible. In addition given patient's poor kidney function would not tolerate a cardiac catheterization in any event.. -discussed case with Dr. Pierre Bali cardiology. Most likely will discontinue nitro drip to determine if truly pain cardiac in nature and to determine if patient's BP significantly rises.  -medication changes per cardiology recommendations  Rheumatic Severe Mitral Valve regurgitation  -at last admission cardio thoracic surgery stated patient currently not candidate for surgical treatment of severe rheumatic mitral valve regurgitation -Per CHF team  note from last admission  patient needs aggressive CHF management as well as dental service consultation prior to surgery consideration.   HLD -Lipitor 40 mg daily  Acute on CKD stage IV(Upon discharge on 10/6 Cr= 3.18)   Recent Labs Lab 04/15/17 0232 04/16/17 0253 04/17/17 0412 04/18/17 0357 04/18/17 2251 04/19/17 0218  CREATININE 3.59* 3.52* 3.34* 3.04* 3.51* 2.83*  -with aggressive diuresis creatinine is better than at previous discharge. -Renal U/S with no hydronephrosis, L renal atrophy, echogenic renal parenchyma (c/w renal disease)  Anemia of critical illness.   -No evidence of bleeding.  -Holding anticoagulation given most recent retroperitoneal bleed  -anemia panel consistent with anemia of chronic disease transfuse for Hgb <8.0 -Arnesep started per renal   Hypothyroidism -Synthroid   Diabetes type 2 Controlled with Renal Complication -7/61 Hemoglobin A1c= 6.2 -resistant SSI  Anxiety - Continue BuSpar and Wellbutrin, as well as Cymbalta  Hypokalemia -Potassium goal> 4  Hypomagnesemia -Magnesium goal> 2 -Magnesium IV 2 g  Poor dentation -per Teena Dunk DDS note 9/13   defered  dental extractions in the operating room with general anesthesia at last admission until cardiology deems  patient more medically stable.  -Given that patient was only able to remain out of hospital for~ a week would attempt to have teeth removed at this admission if possible in order to move forward with MV replacement. Cardiac status will need to be stabilized first.       .  DVT prophylaxis: SCD Code Status: full Family Communication: none Disposition Plan: TBD   Consultants:  [facility]    Procedures/Significant Events:     I have personally reviewed and interpreted all radiology studies and my findings are as above.  VENTILATOR SETTINGS: none   Cultures none  Antimicrobials: Anti-infectives    Start     Stop   04/14/17 0700  vancomycin (VANCOCIN) IVPB 750 mg/150 ml  premix  Status:  Discontinued     04/14/17 0826   04/13/17 0500  piperacillin-tazobactam (ZOSYN) IVPB 2.25 g     04/17/17 2035   04/12/17 1930  vancomycin (VANCOCIN) IVPB 1000 mg/200 mL premix  Status:  Discontinued     04/12/17 1915   04/12/17 1930  piperacillin-tazobactam (ZOSYN) IVPB 3.375 g     04/12/17 2052       Devices    LINES / TUBES:      Continuous Infusions: . sodium chloride 10 mL/hr at 04/18/17 1700  . furosemide Stopped (04/18/17 2054)  . nitroGLYCERIN 75 mcg/min (04/19/17 0444)     Objective: Vitals:   04/19/17 0452 04/19/17 0454 04/19/17 0456 04/19/17 0550  BP: (!) 156/68 (!) 167/82 (!) 149/93 (!) 158/85  Pulse: (!) 26 73 78   Resp: (!) 25 (!) 22 (!) 21   Temp:      TempSrc:      SpO2: 95% 95% 95%   Weight:      Height:        Intake/Output Summary (Last 24 hours) at 04/19/17 0746 Last data filed at 04/19/17 6073  Gross per 24 hour  Intake              660 ml  Output             3000 ml  Net            -2340 ml   Filed Weights   04/18/17 0500 04/18/17 1013 04/19/17 0325  Weight: 119 lb 4.3 oz (54.1 kg) 119 lb 4.3 oz (54.1 kg)  118 lb 2.7 oz (53.6 kg)     Examination:  General: A/O 4, positive acute respiratory distress, cachectic Eyes: negative scleral hemorrhage, negative anisocoria, negative icterus Neck:  Negative scars, masses, torticollis, lymphadenopathy, JVD Lungs: Clear to auscultation bilaterally, except by basilar crackles, negative wheezes Cardiovascular: Regular rate and rhythm, positive PVC, positive systolic murmur,without  gallop or rub normal S1 and S2. Pain to palpation left upper chest wall/left lateral chest wall Abdomen: negative abdominal pain,nondistended, soft bowel sounds, no rebound, no ascites, no appreciable mass Extremities: No significant cyanosis, clubbing. Negative bilateral pedal edema,  Skin: Negative rashes, lesions, ulcers Psychiatric:  Negative depression, negative anxiety, negative fatigue, negative  mania  Central nervous system:  Cranial nerves 2 through 12 intact, tongue/uvula midline, all extremities muscle strength 5/5, sensation intact throughout, negative dysarthria, negative expressive aphasia, negative receptive aphasia  .     Data Reviewed: Care during the described time interval was provided by me .  I have reviewed this patient's available data, including medical history, events of note, physical examination, and all test results as part of my evaluation.   CBC:  Recent Labs Lab 04/13/17 0106 04/14/17 0237 04/15/17 0232 04/16/17 0253 04/17/17 0412 04/18/17 2251  WBC 16.1* 13.6* 9.5 9.2 7.5 7.8  NEUTROABS 15.1*  --   --   --   --  6.1  HGB 8.0* 8.4* 8.2* 8.5* 9.0* 10.7*  HCT 24.0* 24.9* 24.4* 25.1* 27.1* 32.3*  MCV 79.2 79.3 78.5 77.7* 78.3 80.0  PLT 240 219 223 229 265 947   Basic Metabolic Panel:  Recent Labs Lab 04/13/17 1206  04/15/17 0232 04/16/17 0253 04/17/17 0412 04/18/17 0357 04/18/17 2251 04/19/17 0218  NA  --   < > 131* 133* 134* 134* 133* 135  K  --   < > 3.0* 3.1* 3.0* 3.1* 3.6 4.4  CL  --   < > 96* 96* 96* 92* 91* 93*  CO2  --   < > 21* 23 27 30 29 29   GLUCOSE  --   < > 144* 120* 140* 89 111* 84  BUN  --   < > 45* 41* 36* 32* 31* 28*  CREATININE  --   < > 3.59* 3.52* 3.34* 3.04* 3.51* 2.83*  CALCIUM  --   < > 8.5* 9.0 9.2 9.3 9.5 9.6  MG 1.6*  --  2.0  --  1.5* 1.6*  --  1.5*  PHOS 4.5  < > 4.6 3.9 2.9 2.5  --  2.7  < > = values in this interval not displayed. GFR: Estimated Creatinine Clearance: 16.8 mL/min (A) (by C-G formula based on SCr of 2.83 mg/dL (H)). Liver Function Tests:  Recent Labs Lab 04/12/17 1924  04/15/17 0232 04/16/17 0253 04/17/17 0412 04/18/17 0357 04/19/17 0218  AST 29  --   --   --   --   --   --   ALT 33  --   --   --   --   --   --   ALKPHOS 107  --   --   --   --   --   --   BILITOT 1.3*  --   --   --   --   --   --   PROT 5.8*  --   --   --   --   --   --   ALBUMIN 2.4*  < > 2.0* 2.0* 2.1* 2.2*  2.3*  < > = values in this  interval not displayed. No results for input(s): LIPASE, AMYLASE in the last 168 hours. No results for input(s): AMMONIA in the last 168 hours. Coagulation Profile:  Recent Labs Lab 04/12/17 1924  INR 1.46   Cardiac Enzymes:  Recent Labs Lab 04/12/17 1924 04/13/17 0106 04/13/17 0634 04/18/17 2251  TROPONINI 0.04* 0.04* 0.04* 0.12*   BNP (last 3 results) No results for input(s): PROBNP in the last 8760 hours. HbA1C: No results for input(s): HGBA1C in the last 72 hours. CBG:  Recent Labs Lab 04/17/17 1935 04/18/17 0730 04/18/17 1130 04/18/17 1552 04/18/17 2213  GLUCAP 78 110* 225* 147* 113*   Lipid Profile: No results for input(s): CHOL, HDL, LDLCALC, TRIG, CHOLHDL, LDLDIRECT in the last 72 hours. Thyroid Function Tests: No results for input(s): TSH, T4TOTAL, FREET4, T3FREE, THYROIDAB in the last 72 hours. Anemia Panel: No results for input(s): VITAMINB12, FOLATE, FERRITIN, TIBC, IRON, RETICCTPCT in the last 72 hours. Urine analysis:    Component Value Date/Time   COLORURINE YELLOW 03/26/2017 1539   APPEARANCEUR CLOUDY (A) 03/26/2017 1539   LABSPEC 1.010 03/26/2017 1539   PHURINE 6.5 03/26/2017 1539   GLUCOSEU NEGATIVE 03/26/2017 1539   HGBUR LARGE (A) 03/26/2017 1539   BILIRUBINUR NEGATIVE 03/26/2017 1539   KETONESUR NEGATIVE 03/26/2017 1539   PROTEINUR >300 (A) 03/26/2017 1539   NITRITE NEGATIVE 03/26/2017 1539   LEUKOCYTESUR LARGE (A) 03/26/2017 1539   Sepsis Labs: @LABRCNTIP (procalcitonin:4,lacticidven:4)  ) Recent Results (from the past 240 hour(s))  MRSA PCR Screening     Status: None   Collection Time: 04/12/17  4:47 PM  Result Value Ref Range Status   MRSA by PCR NEGATIVE NEGATIVE Final    Comment:        The GeneXpert MRSA Assay (FDA approved for NASAL specimens only), is one component of a comprehensive MRSA colonization surveillance program. It is not intended to diagnose MRSA infection nor to guide  or monitor treatment for MRSA infections.   Culture, blood (Routine X 2) w Reflex to ID Panel     Status: None   Collection Time: 04/12/17  7:23 PM  Result Value Ref Range Status   Specimen Description BLOOD RIGHT ANTECUBITAL  Final   Special Requests   Final    BOTTLES DRAWN AEROBIC AND ANAEROBIC Blood Culture adequate volume   Culture NO GROWTH 5 DAYS  Final   Report Status 04/17/2017 FINAL  Final  Culture, blood (Routine X 2) w Reflex to ID Panel     Status: None   Collection Time: 04/12/17  7:28 PM  Result Value Ref Range Status   Specimen Description BLOOD LEFT HAND  Final   Special Requests   Final    BOTTLES DRAWN AEROBIC AND ANAEROBIC Blood Culture results may not be optimal due to an excessive volume of blood received in culture bottles   Culture NO GROWTH 5 DAYS  Final   Report Status 04/17/2017 FINAL  Final         Radiology Studies: Dg Chest Port 1 View  Result Date: 04/19/2017 CLINICAL DATA:  Chest pain tonight EXAM: PORTABLE CHEST 1 VIEW COMPARISON:  04/17/2017 FINDINGS: Cardiac enlargement with vascular congestion. Perihilar infiltrates likely represent edema. Can't exclude superimposed pneumonia in the upper lobes. Infiltrates are less prominent than on previous study suggesting some interval clearing. No blunting of costophrenic angles. No pneumothorax. Calcification of the aorta. Mediastinal contours appear intact. IMPRESSION: Cardiac enlargement with pulmonary vascular congestion and perihilar edema and/or pneumonia, with some improvement of perihilar infiltrates since previous study. Aortic  atherosclerosis. Electronically Signed   By: Lucienne Capers M.D.   On: 04/19/2017 05:37        Scheduled Meds: . amLODipine  10 mg Oral Daily  . aspirin EC  81 mg Oral Daily  . atorvastatin  40 mg Oral Daily  . buPROPion  300 mg Oral Daily  . busPIRone  15 mg Oral BID  . cholecalciferol  1,000 Units Oral Daily  . darbepoetin (ARANESP) injection - NON-DIALYSIS  40  mcg Subcutaneous Q Tue-1800  . docusate sodium  100 mg Oral BID  . DULoxetine  30 mg Oral Daily  . ferrous sulfate  325 mg Oral BID WC  . hydrALAZINE  100 mg Oral Q8H  . insulin aspart  0-20 Units Subcutaneous TID WC  . insulin aspart  0-5 Units Subcutaneous QHS  . isosorbide mononitrate  90 mg Oral Daily  . levothyroxine  112 mcg Oral QAC breakfast  . lidocaine  1 patch Transdermal Q24H  . magnesium gluconate  250 mg Oral Daily  . mouth rinse  15 mL Mouth Rinse BID  . montelukast  10 mg Oral Daily  . pantoprazole  40 mg Oral Daily   Continuous Infusions: . sodium chloride 10 mL/hr at 04/18/17 1700  . furosemide Stopped (04/18/17 2054)  . nitroGLYCERIN 75 mcg/min (04/19/17 0444)     LOS: 7 days    Time spent: 40 minutes    WOODS, Geraldo Docker, MD [facility] Pager [phone]   If 7PM-7AM, please contact night-coverage [email or web address] Password [email or web address] 04/19/2017, 7:46 AM

## 2017-04-20 DIAGNOSIS — R079 Chest pain, unspecified: Secondary | ICD-10-CM

## 2017-04-20 LAB — GLUCOSE, CAPILLARY
Glucose-Capillary: 135 mg/dL — ABNORMAL HIGH (ref 65–99)
Glucose-Capillary: 166 mg/dL — ABNORMAL HIGH (ref 65–99)
Glucose-Capillary: 165 mg/dL — ABNORMAL HIGH (ref 65–99)
Glucose-Capillary: 138 mg/dL — ABNORMAL HIGH (ref 65–99)

## 2017-04-20 LAB — RENAL FUNCTION PANEL
Albumin: 2.3 g/dL — ABNORMAL LOW (ref 3.5–5.0)
Anion gap: 8 (ref 5–15)
BUN: 29 mg/dL — ABNORMAL HIGH (ref 6–20)
CO2: 32 mmol/L (ref 22–32)
Calcium: 9.3 mg/dL (ref 8.9–10.3)
Chloride: 92 mmol/L — ABNORMAL LOW (ref 101–111)
Creatinine, Ser: 2.97 mg/dL — ABNORMAL HIGH (ref 0.44–1.00)
GFR calc Af Amer: 18 mL/min — ABNORMAL LOW (ref >60)
GFR calc non Af Amer: 16 mL/min — ABNORMAL LOW (ref >60)
Glucose, Bld: 128 mg/dL — ABNORMAL HIGH (ref 65–99)
Phosphorus: 3.1 mg/dL (ref 2.5–4.6)
Potassium: 3.6 mmol/L (ref 3.5–5.1)
Sodium: 132 mmol/L — ABNORMAL LOW (ref 135–145)

## 2017-04-20 LAB — MAGNESIUM: Magnesium: 2.1 mg/dL (ref 1.7–2.4)

## 2017-04-20 MED ORDER — POTASSIUM CHLORIDE CRYS ER 20 MEQ PO TBCR
60.0000 meq | EXTENDED_RELEASE_TABLET | Freq: Once | ORAL | Status: AC
  Administered 2017-04-20: 60 meq via ORAL
  Filled 2017-04-20: qty 3

## 2017-04-20 MED ORDER — GI COCKTAIL ~~LOC~~: 30.0000 mL | Freq: Three times a day (TID) | ORAL | Status: DC | PRN

## 2017-04-20 MED ORDER — ACETAMINOPHEN 325 MG PO TABS: 650.0000 mg | ORAL_TABLET | Freq: Four times a day (QID) | ORAL | Status: DC | PRN

## 2017-04-20 MED ORDER — FUROSEMIDE 80 MG PO TABS
160.0000 mg | ORAL_TABLET | Freq: Two times a day (BID) | ORAL | Status: DC
  Administered 2017-04-20 – 2017-04-23 (×7): 160 mg via ORAL
  Filled 2017-04-20 (×7): qty 2

## 2017-04-20 MED ORDER — NEPRO/CARBSTEADY PO LIQD
237.0000 mL | Freq: Two times a day (BID) | ORAL | Status: DC
  Administered 2017-04-20 – 2017-04-22 (×4): 237 mL via ORAL
  Filled 2017-04-20 (×9): qty 237

## 2017-04-20 MED ORDER — POTASSIUM CHLORIDE CRYS ER 20 MEQ PO TBCR: 40.0000 meq | EXTENDED_RELEASE_TABLET | Freq: Once | ORAL | Status: DC

## 2017-04-20 MED ORDER — MORPHINE SULFATE (PF) 2 MG/ML IV SOLN
2.0000 mg | INTRAVENOUS | Status: DC | PRN
  Administered 2017-04-20 – 2017-04-23 (×12): 2 mg via INTRAVENOUS
  Filled 2017-04-20 (×13): qty 1

## 2017-04-20 MED ORDER — CARVEDILOL 6.25 MG PO TABS
6.2500 mg | ORAL_TABLET | Freq: Two times a day (BID) | ORAL | Status: DC
  Administered 2017-04-20 – 2017-04-21 (×3): 6.25 mg via ORAL
  Filled 2017-04-20 (×5): qty 1

## 2017-04-20 MED ORDER — FUROSEMIDE 80 MG PO TABS: 80.0000 mg | ORAL_TABLET | Freq: Two times a day (BID) | ORAL | Status: DC

## 2017-04-20 MED ORDER — PANTOPRAZOLE SODIUM 40 MG PO TBEC
40.0000 mg | DELAYED_RELEASE_TABLET | Freq: Two times a day (BID) | ORAL | Status: DC
  Administered 2017-04-20 – 2017-04-23 (×6): 40 mg via ORAL
  Filled 2017-04-20 (×7): qty 1

## 2017-04-20 MED ORDER — DOXAZOSIN MESYLATE 2 MG PO TABS
2.0000 mg | ORAL_TABLET | Freq: Every day | ORAL | Status: DC
  Administered 2017-04-20 – 2017-04-23 (×4): 2 mg via ORAL
  Filled 2017-04-20 (×4): qty 1

## 2017-04-20 NOTE — Progress Notes (Signed)
[place]  ZOX:[id number] DOB: Nov 01, 1950 DOA: 04/12/2017 PCP: [name], MD    Brief Narrative:  66yo F resident of [facility] in [place] w/ a Hx of Depression, CVA, Chronic Diastolic CHF, Severe Rheumatic Mitral Valve disease, CKD stage IV, Paroxysmal Atrial Fibrillation, HTN, HLD, diet-controlled DM, Hypothyroidism, and a Retroperitoneal Hematoma who was discharged on 10/6 after she was diuresed over 26 L w/ a weight change of 143 to 123 pounds.  She was discharged to a SNF with the plan to receive aggressive rehabilitation, and eventually be seen by dental medicine for dental extractions in hopes of eventually undergoing mitral valve surgery. Her diuretics were stopped at the nursing home and she developed shortness of breath, weakness, increased edema, as well as increase in frequency of chest pain. In the ED she was found to have sats in the 70s, a chest x-ray consistent with pulmonary edema, and a 12-lead showing atrial fibrillation with prolonged QT as well as BNP of over 30,000.   Subjective: The pt has been complaining of "chest pain" which she tells me is starting in her epigastrium and radiating up into a substernal position.  She relates associated nausea w/o vomiting.  She denies sob, abdom pain, or HA.  Assessment & Plan:  Acute hypoxic respiratory failure due to pulmonary edema Resolved w/ pt now on RA  Recurrent Epigastric/Chest pain Unclear etiology - maximize GI tx and follow for relief   Acute on Chronic Diastolic CHF TTE Sept 8119 noted EF 60-65% w/ severe MR - diuresis per CHF Team and Nephrology - appears well compensated at this time  [facility] Weights   04/18/17 1013 04/19/17 0325 04/20/17 0320  Weight: 54.1 kg (119 lb 4.3 oz) 53.6 kg (118 lb 2.7 oz) 52.7 kg (116 lb 2.9 oz)    Acute on CKD stage IV At discharge on 10/6 Cr 3.18 - w/ with diuresis creatinine is better than at previous discharge - care per  Nephrology   Recent Labs Lab 04/17/17 0412 04/18/17 0357 04/18/17 2251 04/19/17 0218 04/20/17 0242  CREATININE 3.34* 3.04* 3.51* 2.83* 2.97*    R hydronephrosis due to [facility] Renal U/S with no hydronephrosis this admit, L renal atrophy, echogenic renal parenchyma   Rheumatic Severe Mitral Valve regurgitation at last admission TCTS stated patient currently not candidate for surgical treatment of severe rheumatic mitral valve regurgitation - will need medical stabilization, physical rehab, cardiac cath, and extensive dental care prior to being a candidate  Paroxysmal atrial fibrillation  not on anticoagulation secondary to recent retroperitoneal bleed - off amio due to jxnl rhythm - Cards following   HCAP? Questionable pneumonia - completed 7 days of Zosyn  CAD / status post MI (last admission) Not currently a candidate for cardiac cath given renal dysfxn  HLD Cont Lipitor   Anemia of critical illness Holding anticoagulation given most recent retroperitoneal bleed - anemia panel consistent with anemia of chronic disease - Aranesep started per renal  Hypothyroidism Cont Synthroid  DM2  9/13 A1c 6.2 - CBG well controlled   Anxiety Continue home meds   Poor dentation per Teena Dunk DDS note 9/13 defered dental extractions in the operating room with general anesthesia at last admission until Cardiology deemed medically stable  DVT prophylaxis: SCDs Code Status: FULL CODE Family Communication: no family present at time of exam  Disposition Plan:   Consultants:  PCCM Cardiology  Nephrology   Antimicrobials:  Zosyn 10/14 > 10/19  Objective: Blood pressure (!) 154/88, pulse 85, temperature (!) 97.5 F (36.4 C), temperature source Oral, resp. rate 19, height 5\' 9"  (1.753 m), weight 52.7 kg (116 lb 2.9 oz), SpO2 94 %.  Intake/Output Summary (Last 24 hours) at 04/20/17 1133 Last data filed at 04/20/17 0858  Gross per 24 hour  Intake          1405.58 ml    Output             1367 ml  Net            38.58 ml   Filed Weights   04/18/17 1013 04/19/17 0325 04/20/17 0320  Weight: 54.1 kg (119 lb 4.3 oz) 53.6 kg (118 lb 2.7 oz) 52.7 kg (116 lb 2.9 oz)    Examination: General: No acute respiratory distress Lungs: Clear to auscultation bilaterally without wheezes or crackles Cardiovascular: Regular rate and rhythm without murmur gallop or rub  Abdomen: Nontender, nondistended, soft, bowel sounds positive, no rebound, no ascites, no appreciable mass Extremities: No significant cyanosis, clubbing, or edema bilateral lower extremities  CBC:  Recent Labs Lab 04/14/17 0237 04/15/17 0232 04/16/17 0253 04/17/17 0412 04/18/17 2251  WBC 13.6* 9.5 9.2 7.5 7.8  NEUTROABS  --   --   --   --  6.1  HGB 8.4* 8.2* 8.5* 9.0* 10.7*  HCT 24.9* 24.4* 25.1* 27.1* 32.3*  MCV 79.3 78.5 77.7* 78.3 80.0  PLT 219 223 229 265 846   Basic Metabolic Panel:  Recent Labs Lab 04/15/17 0232 04/16/17 0253 04/17/17 0412 04/18/17 0357 04/18/17 2251 04/19/17 0218 04/20/17 0242  NA 131* 133* 134* 134* 133* 135 132*  K 3.0* 3.1* 3.0* 3.1* 3.6 4.4 3.6  CL 96* 96* 96* 92* 91* 93* 92*  CO2 21* 23 27 30 29 29  32  GLUCOSE 144* 120* 140* 89 111* 84 128*  BUN 45* 41* 36* 32* 31* 28* 29*  CREATININE 3.59* 3.52* 3.34* 3.04* 3.51* 2.83* 2.97*  CALCIUM 8.5* 9.0 9.2 9.3 9.5 9.6 9.3  MG 2.0  --  1.5* 1.6*  --  1.5* 2.1  PHOS 4.6 3.9 2.9 2.5  --  2.7 3.1   GFR: Estimated Creatinine Clearance: 15.7 mL/min (A) (by C-G formula based on SCr of 2.97 mg/dL (H)).  Liver Function Tests:  Recent Labs Lab 04/16/17 0253 04/17/17 0412 04/18/17 0357 04/19/17 0218 04/20/17 0242  ALBUMIN 2.0* 2.1* 2.2* 2.3* 2.3*    Cardiac Enzymes:  Recent Labs Lab 04/18/17 2251 04/19/17 0527  TROPONINI 0.12* 0.14*    HbA1C: Hgb A1c MFr Bld  Date/Time Value Ref Range Status  03/12/2017 02:00 PM 6.2 (H) 4.8 - 5.6 % Final    Comment:    (NOTE) Pre diabetes:           5.7%-6.4% Diabetes:              >6.4% Glycemic control for   <7.0% adults with diabetes     CBG:  Recent Labs Lab 04/19/17 0809 04/19/17 1200 04/19/17 1553 04/19/17 2102 04/20/17 0808  GLUCAP 142* 202* 136* 132* 135*    Recent Results (from the past 240 hour(s))  MRSA PCR Screening     Status: None   Collection Time: 04/12/17  4:47 PM  Result Value Ref Range Status   MRSA by PCR NEGATIVE NEGATIVE Final    Comment:        The GeneXpert MRSA Assay (FDA approved for NASAL specimens only), is one component of a comprehensive MRSA colonization surveillance program.  It is not intended to diagnose MRSA infection nor to guide or monitor treatment for MRSA infections.   Culture, blood (Routine X 2) w Reflex to ID Panel     Status: None   Collection Time: 04/12/17  7:23 PM  Result Value Ref Range Status   Specimen Description BLOOD RIGHT ANTECUBITAL  Final   Special Requests   Final    BOTTLES DRAWN AEROBIC AND ANAEROBIC Blood Culture adequate volume   Culture NO GROWTH 5 DAYS  Final   Report Status 04/17/2017 FINAL  Final  Culture, blood (Routine X 2) w Reflex to ID Panel     Status: None   Collection Time: 04/12/17  7:28 PM  Result Value Ref Range Status   Specimen Description BLOOD LEFT HAND  Final   Special Requests   Final    BOTTLES DRAWN AEROBIC AND ANAEROBIC Blood Culture results may not be optimal due to an excessive volume of blood received in culture bottles   Culture NO GROWTH 5 DAYS  Final   Report Status 04/17/2017 FINAL  Final     Scheduled Meds: . amLODipine  10 mg Oral Daily  . aspirin EC  81 mg Oral Daily  . atorvastatin  40 mg Oral Daily  . buPROPion  300 mg Oral Daily  . busPIRone  15 mg Oral BID  . carvedilol  6.25 mg Oral BID WC  . cholecalciferol  1,000 Units Oral Daily  . darbepoetin (ARANESP) injection - NON-DIALYSIS  40 mcg Subcutaneous Q Tue-1800  . docusate sodium  100 mg Oral BID  . doxazosin  2 mg Oral Daily  . DULoxetine  30 mg  Oral Daily  . ferrous sulfate  325 mg Oral BID WC  . furosemide  160 mg Oral BID  . hydrALAZINE  100 mg Oral Q8H  . insulin aspart  0-20 Units Subcutaneous TID WC  . insulin aspart  0-5 Units Subcutaneous QHS  . isosorbide mononitrate  120 mg Oral Daily  . levothyroxine  112 mcg Oral QAC breakfast  . lidocaine  1 patch Transdermal Q24H  . magnesium gluconate  250 mg Oral Daily  . montelukast  10 mg Oral Daily  . pantoprazole  40 mg Oral Daily     LOS: 8 days   Cherene Altes, MD [facility] Office  [phone] Pager - Text Page per Amion as per below:  On-Call/Text Page:      [email or web address]      password [email or web address]  If 7PM-7AM, please contact night-coverage [email or web address] Password [facility] 04/20/2017, 11:33 AM

## 2017-04-20 NOTE — Progress Notes (Signed)
Advanced Heart Failure Rounding Note  Primary Cardiologist: Dr. Bettina Gavia HF: Dr. Aundra Dubin   Subjective:    Breathing better. Off oxygen. Sats in upper 90s. Denies CP. No lightheadedness or dizziness, but hasn't been up and around.   Family wants her to move to [place] with the son.   Weight down 2 lbs. Creatinine 2.8 -> 2.9. Relatively stable.   Objective:   Weight Range: 116 lb 2.9 oz (52.7 kg) Body mass index is 17.16 kg/m.   Vital Signs:   Temp:  [98 F (36.7 C)-98.6 F (37 C)] 98.5 F (36.9 C) (10/22 0300) Pulse Rate:  [25-80] 70 (10/22 0400) Resp:  [13-23] 19 (10/22 0400) BP: (134-157)/(63-93) 145/91 (10/22 0400) SpO2:  [90 %-98 %] 93 % (10/22 0400) Weight:  [116 lb 2.9 oz (52.7 kg)] 116 lb 2.9 oz (52.7 kg) (10/22 0320) Last BM Date: 04/18/17  Weight change: Filed Weights   04/18/17 1013 04/19/17 0325 04/20/17 0320  Weight: 119 lb 4.3 oz (54.1 kg) 118 lb 2.7 oz (53.6 kg) 116 lb 2.9 oz (52.7 kg)    Intake/Output:   Intake/Output Summary (Last 24 hours) at 04/20/17 0756 Last data filed at 04/20/17 0400  Gross per 24 hour  Intake          1785.08 ml  Output             1452 ml  Net           333.08 ml      Physical Exam    General: Fatigued and chronically ill appearing. No resp difficulty. HEENT: Normal Neck: Supple. JVP 7-8 cm. Carotids 2+ bilat; no bruits. No thyromegaly or nodule noted. Cor: PMI nondisplaced. RRR, No M/G/R noted Lungs: Diminished throughout Abdomen: Soft, non-tender, non-distended, no HSM. No bruits or masses. +BS  Extremities: No cyanosis, clubbing, or rash. R and LLE no edema.  Neuro: Alert & orientedx3, cranial nerves grossly intact. moves all 4 extremities w/o difficulty. Affect pleasant   Telemetry   NSR 60-70s with PVCs, Personally reviewed.   EKG    Junctional Rhythm 59 bpm on admit   Labs    CBC  Recent Labs  04/18/17 2251  WBC 7.8  NEUTROABS 6.1  HGB 10.7*  HCT 32.3*  MCV 80.0  PLT 878   Basic  Metabolic Panel  Recent Labs  04/19/17 0218 04/20/17 0242  NA 135 132*  K 4.4 3.6  CL 93* 92*  CO2 29 32  GLUCOSE 84 128*  BUN 28* 29*  CREATININE 2.83* 2.97*  CALCIUM 9.6 9.3  MG 1.5* 2.1  PHOS 2.7 3.1   Liver Function Tests  Recent Labs  04/19/17 0218 04/20/17 0242  ALBUMIN 2.3* 2.3*   No results for input(s): LIPASE, AMYLASE in the last 72 hours. Cardiac Enzymes  Recent Labs  04/18/17 2251 04/19/17 0527  TROPONINI 0.12* 0.14*    BNP: BNP (last 3 results)  Recent Labs  03/12/17 1400  BNP 2,230.5*    ProBNP (last 3 results) No results for input(s): PROBNP in the last 8760 hours.   D-Dimer No results for input(s): DDIMER in the last 72 hours. Hemoglobin A1C No results for input(s): HGBA1C in the last 72 hours. Fasting Lipid Panel No results for input(s): CHOL, HDL, LDLCALC, TRIG, CHOLHDL, LDLDIRECT in the last 72 hours. Thyroid Function Tests No results for input(s): TSH, T4TOTAL, T3FREE, THYROIDAB in the last 72 hours.  Invalid input(s): FREET3  Other results:  Imaging   No results found.  Medications:  Scheduled Medications: . amLODipine  10 mg Oral Daily  . aspirin EC  81 mg Oral Daily  . atorvastatin  40 mg Oral Daily  . buPROPion  300 mg Oral Daily  . busPIRone  15 mg Oral BID  . carvedilol  3.125 mg Oral BID WC  . cholecalciferol  1,000 Units Oral Daily  . darbepoetin (ARANESP) injection - NON-DIALYSIS  40 mcg Subcutaneous Q Tue-1800  . docusate sodium  100 mg Oral BID  . DULoxetine  30 mg Oral Daily  . ferrous sulfate  325 mg Oral BID WC  . hydrALAZINE  100 mg Oral Q8H  . insulin aspart  0-20 Units Subcutaneous TID WC  . insulin aspart  0-5 Units Subcutaneous QHS  . isosorbide mononitrate  120 mg Oral Daily  . levothyroxine  112 mcg Oral QAC breakfast  . lidocaine  1 patch Transdermal Q24H  . magnesium gluconate  250 mg Oral Daily  . montelukast  10 mg Oral Daily  . pantoprazole  40 mg Oral Daily    Infusions: .  sodium chloride 10 mL/hr at 04/18/17 1700  . furosemide Stopped (04/19/17 1900)  . nitroGLYCERIN 15 mcg/min (04/20/17 0500)    PRN Medications: acetaminophen, acetaminophen-codeine, albuterol, bisacodyl, morphine injection, nitroGLYCERIN, ondansetron (ZOFRAN) IV  Patient Profile   Jocelyn Hill is a 66 year old with history of MR underlying rheumatic disease, CAD, HTN, DM, hypothyroidism, A fib, severe mitral valve disease, CKD, r hypdronephrosis, and retroperitoneal hematoma.MVR surgery was deferred last admit due to multiple complications.   Admitted with respiratory failure and volume overload  Assessment/Plan   1. Acute Hypoxic Respiratory Failure: Improved, currently on room air.   2. A/C Diastolic Heart Failure: ECHO 02/2017 EF 60-65% with severe MR. Volume status significantly improved.  Creatinine stable.  Ultimately, need mitral valve fixed to keep her out of CHF.  - Think we can transition her over to Lasix 80 mg po bid today, follow response.  - Would make sure she has lasix on discharge.  3. AKI on CKD Stage IV:  Had R hydronephrosis last month and required perc drain. Renal US 04/14/17 - no evidence of hydronephrosis.  Creatinine stable at 2.9 today.  This may be her new baseline.   - Transition to po Lasix today, 80 mg po bid ordered.  4. HTN: BP remains elevated.  - Continue imdur 120 mg daily. Stop NTG drip.  - Continue hydralazine 100 mg three times a day  - Continue amlodipine 10 mg daily.  - Increase coreg 6.25 mg BID. Follow closely for bradycardia.  - Probably will not be able to safely increase beta blockade further, will use doxazosin.   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  Noted on TEE 02/2017. Not a candidate for MVR at this point per Dr Roxy Manns given severe deconditioning.  Prior to MV repair/replacement, she will need cardiac cath.  This will be risky given CKD stage IV.  It is likely that she is going to need to go back to rehab to improve her  deconditioning prior to considering mitral valve surgery. 6. H/O spontaneous retroperitoneal bleed in 02/2017: off anticoagulants currently.  7.PAF: 02/2017 converted to NSR on amio. Developed RP hematoma so anticoagulants stopped.  Had been in junctional rhythm here and amiodarone stopped.  Went into atrial flutter over weekend but now back in NSR.  Continue ted hose+ SCDs fo DVT prophylaxis. - On low dose Coreg.  8. CAD: Last admit LHC was considered but due to worsening renal failure  this was deferred, will need prior to MV surgery.  Had chest pressure in setting of atrial flutter with RVR.  No further CP.   - Continue ASA 81.  - Check lipids in am.  9. ID: Finished course of Zosyn for possible HCAP.  10. Deconditioning: Marked.  Has not been out of bed.  - PT consulted. Cardiac rehab consulted.   Disposition: Will need SNF for rehab.      Length of Stay: [address]  Jocelyn Hill  04/20/2017, 7:56 AM  Advanced Heart Failure Team Pager [phone] (M-F; 7a - 4p)  Please contact [facility] for night-coverage after hours (4p -7a ) and weekends on [email or web address]  Patient seen with PA, agree with the above note.   Severe mitral regurgitation, suspect rheumatic.  This is likely the trigger for CHF (in addition to CKD stage IV).  Ideally, will have surgical valve replacement/repair, but patient is markedly deconditioned and not ready for this yet.  She will also need cardiac cath prior which will be risky because of CKD stage IV.   - I think that she is going to have to work extensively with PT/rehab to improve strength and become ambulatory.  At that point, we will plan cardiac cath but with understanding that renal function could progressively worsen.   Creatinine stable today, volume looks much better than prior.  Weight down.  Transition to po Lasix 80 mg bid.    BP remains high.  Need BP reduction for afterload lowering which may help with mitral regurgitation.  - Agree with  increase in Coreg carefully to 6.25 mg bid (watch for recurrent junctional rhythm).   - Add doxazosin, can titrate up.   Atrial flutter over weekend, now back in NSR.  Off amiodarone with junctional rhythm, would leave off.  Off anticoagulation with severe retroperitoneal hemorrhage and hydronephrosis.  Eventually, would restart anticoagulation but not yet.    Jocelyn Hill 04/20/2017 8:32 AM

## 2017-04-20 NOTE — Progress Notes (Signed)
Patient is currently on room air with sats of 92%. Patient is in no distress and all vitals are stable. BIPAP is not needed at this time. Will continue to monitor.

## 2017-04-20 NOTE — Plan of Care (Signed)
Problem: Pain Managment: Goal: General experience of comfort will improve Outcome: Not Progressing Pt's chest pain not controlled by nitro or morphine. Tylenol-codeine decreases it from 7/10 to 5/10 but she can only have it Q6 hours (pain inc to 7/10 again within 2-3 hours).   Problem: Physical Regulation: Goal: Ability to maintain clinical measurements within normal limits will improve Outcome: Not Progressing BP 093 systolic; HR 54; Pt experiences inc WOB and 1L O2 relieves it with time; pt fatigue inc throughout shift  Problem: Skin Integrity: Goal: Risk for impaired skin integrity will decrease Outcome: Not Progressing Pt resists repositioning

## 2017-04-20 NOTE — Progress Notes (Signed)
Initial Nutrition Assessment  DOCUMENTATION CODES:   Severe malnutrition in context of chronic illness, Underweight  INTERVENTION:   -Nepro Shake po BID, each supplement provides 425 kcal and 19 grams protein  NUTRITION DIAGNOSIS:   Malnutrition (Severe) related to chronic illness (CHF) as evidenced by moderate depletion of body fat, severe depletion of body fat, moderate depletions of muscle mass, severe depletion of muscle mass, energy intake < 75% for > or equal to 1 month.  GOAL:   Patient will meet greater than or equal to 90% of their needs  MONITOR:   PO intake, Supplement acceptance, Labs, Weight trends, Skin, I & O's  REASON FOR ASSESSMENT:   Consult Diet education  ASSESSMENT:   66 year old female patient With known history of severe mitral valve regurgitation and diastolic heart failure recently discharged on 10/6 after a prolonged course for decompensated diastolic heart failure. She was diuresed over 26 L with weight dropping from 143 to 123 at time of discharge. Her diuretics were not continued upon time of discharge Jocelyn Hill presents with a 2 to three-day history of progressive respiratory distress, a BNP of over 30,000,and chest x-ray consistent with pulmonary edema. We will admitted with working diagnosis of decompensated diastolic heart failure, pulmonary edema, and possible healthcare associated pneumonia although this seems less likely.  Pt admitted with CHF exacerbation.   Pt sleepy at time of visit with flat affect; often would only answer close ended questions. She reports poor appetite PTA, often consuming 50% of meals or less. Meal completion records reveal 25-100% meal completion. Pt consumed about 75% of breakfast tray.   Per pt, she has lost about 50# since admission due to diuresis (per admission wt, pt has lost approximately 23# within the past 8 days). Pt is -7.9 L since admission. Pt has experienced a 8# (6.4%) wt loss over the past 2 weeks, which is  significant, however, expect some is related to diuresis.   Nutrition-Focused physical exam completed. Findings are moderate to severe fat depletion, moderate to severe muscle depletion, and no edema.   Pt was not alert enough to participate in education at this time (which is what RD was initially consulted for).   Labs reviewed: Na: 132 (on IV supplementation), CBG: 132-202 (inpatient orders for glycemic control are 0-20 units insulin aspart TID with meals and 0-5 units insulin aspart q HS).   Diet Order:  Diet renal/carb modified with fluid restriction Diet-HS Snack? Nothing; Room service appropriate? Yes; Fluid consistency: Thin; Fluid restriction: 1200 mL Fluid  Skin:  Reviewed, no issues  Last BM:  04/18/17  Height:   Ht Readings from Last 1 Encounters:  04/19/17 5\' 9"  (1.753 m)    Weight:   Wt Readings from Last 1 Encounters:  04/20/17 116 lb 2.9 oz (52.7 kg)    Ideal Body Weight:  65.9 kg  BMI:  Body mass index is 17.16 kg/m.  Estimated Nutritional Needs:   Kcal:  1650-1850  Protein:  90-105 grams  Fluid:  1.2 L  EDUCATION NEEDS:   Education needs no appropriate at this time  Jocelyn [place], RD, LDN, CDE Pager: [phone] After hours Pager: [phone]

## 2017-04-20 NOTE — Progress Notes (Signed)
Occupational Therapy Evaluation Patient Details Name: Jocelyn Hill MRN: [id number] DOB: 1950-09-02 Today's Date: 04/20/2017    History of Present Illness 66 year old female patient With known history of severe mitral valve regurgitation and diastolic heart failure recently discharged on 10/6 after a prolonged course for decompensated diastolic heart failure. She was diuresed over 26 L with weight dropping from 143 to 123 at time of discharge. Her diuretics were not continued upon time of discharge. She presents with a 2 to three-day history of progressive respiratory distress, a BNP of over 30,000,and chest x-ray consistent with pulmonary edema. Admitted with working diagnosis of decompensated diastolic heart failure, pulmonary edema, and possible healthcare associated pneumonia although this seems less likely.   Clinical Impression   PTA, pt was at [facility] for rehabilitation. She currently requires min assist for sit<>stand transfer in preparation for toileting tasks, min assist for LB ADL, and min guard assist for UB ADL. Pt presents with decreased problem solving skills and generalized weakness impacting her ability to participate in ADL and functional mobility. She would benefit from continued OT services while admitted to improve independence and safety with ADL and functional mobility. Recommend return to SNF for continued rehabilitation once medically stable.     Follow Up Recommendations  SNF;Supervision/Assistance - 24 hour    Equipment Recommendations  Other (comment) (TBD at next venue of care)    Recommendations for Other Services       Precautions / Restrictions Precautions Precautions: Fall Restrictions Weight Bearing Restrictions: No      Mobility Bed Mobility Overal bed mobility: Needs Assistance Bed Mobility: Rolling;Sidelying to Sit;Sit to Sidelying Rolling: Supervision Sidelying to sit: Supervision     Sit to sidelying: Min guard;HOB elevated General bed  mobility comments: Min guard for safety.   Transfers Overall transfer level: Needs assistance Equipment used: Rolling walker (2 wheeled) Transfers: Sit to/from Stand Sit to Stand: Min assist         General transfer comment: MIn assist to power up. Limited tolerance by dizziness.     Balance Overall balance assessment: Needs assistance Sitting-balance support: Feet supported;No upper extremity supported Sitting balance-Leahy Scale: Fair     Standing balance support: Bilateral upper extremity supported Standing balance-Leahy Scale: Poor Standing balance comment: walker and minA for steadying                           ADL either performed or assessed with clinical judgement   ADL Overall ADL's : Needs assistance/impaired Eating/Feeding: Supervision/ safety;Sitting   Grooming: Supervision/safety;Sitting   Upper Body Bathing: Min guard;Sitting   Lower Body Bathing: Minimal assistance;Sit to/from stand   Upper Body Dressing : Min guard;Sitting   Lower Body Dressing: Minimal assistance;Sit to/from Health and safety inspector Details (indicate cue type and reason): Min assist to rise to standing. Unable to progress beyond sit<>stand due to dizziness.            General ADL Comments: Pt very flat throughout session.      Vision Baseline Vision/History: No visual deficits Patient Visual Report: No change from baseline Additional Comments: Near continuous blinking with decreased smoothness of tracking noted. Pt with difficulty visually attending to stimuli but was able to read "occupational therapy" from therapist's badge Need to further assess.     Perception     Praxis Praxis Praxis-Other Comments: Need further assessment    Pertinent Vitals/Pain Pain Assessment: Faces Faces Pain Scale: Hurts little more Pain Location: L  chest pain  Pain Descriptors / Indicators: Aching;Sore;Sharp Pain Intervention(s): Limited activity within patient's  tolerance;Monitored during session     Hand Dominance     Extremity/Trunk Assessment Upper Extremity Assessment Upper Extremity Assessment: Generalized weakness;LUE deficits/detail LUE Deficits / Details: Painful with movement.    Lower Extremity Assessment Lower Extremity Assessment: Generalized weakness       Communication Communication Communication: No difficulties   Cognition Arousal/Alertness: Awake/alert Behavior During Therapy: Flat affect Overall Cognitive Status: No family/caregiver present to determine baseline cognitive functioning                                 General Comments: Pt slow to respond to questions and reporting "I'm not sure" when asked questions such as "are you in pain?"    General Comments  HR low throughout 57-66 bpm    Exercises     Shoulder Instructions      Home Living Family/patient expects to be discharged to:: Skilled nursing facility                                        Prior Functioning/Environment Level of Independence: Needs assistance  Gait / Transfers Assistance Needed: reports she has been using a RW for mobility with PT at SNF ADL's / Homemaking Assistance Needed: reports working with OT at SNF on improving bathing and dressing independence but still requires significant assist.             OT Problem List: Decreased strength;Decreased activity tolerance;Impaired balance (sitting and/or standing);Decreased safety awareness;Decreased knowledge of use of DME or AE;Decreased knowledge of precautions;Pain      OT Treatment/Interventions: Self-care/ADL training;Therapeutic exercise;Energy conservation;DME and/or AE instruction;Therapeutic activities;Patient/family education;Balance training    OT Goals(Current goals can be found in the care plan section) Acute Rehab OT Goals Patient Stated Goal: to feel better OT Goal Formulation: With patient Time For Goal Achievement: 05/04/17 Potential  to Achieve Goals: Fair ADL Goals Pt Will Perform Grooming: standing;with min guard assist Pt Will Perform Upper Body Dressing: sitting;with set-up Pt Will Perform Lower Body Dressing: with min guard assist;sit to/from stand Pt Will Transfer to Toilet: ambulating;bedside commode;with min guard assist Pt Will Perform Toileting - Clothing Manipulation and hygiene: with min guard assist;sit to/from stand  OT Frequency: Min 2X/week   Barriers to D/C:            Co-evaluation              AM-PAC PT "6 Clicks" Daily Activity     Outcome Measure Help from another person eating meals?: None Help from another person taking care of personal grooming?: A Little Help from another person toileting, which includes using toliet, bedpan, or urinal?: A Little Help from another person bathing (including washing, rinsing, drying)?: A Lot Help from another person to put on and taking off regular upper body clothing?: A Little Help from another person to put on and taking off regular lower body clothing?: A Lot 6 Click Score: 17   End of Session Equipment Utilized During Treatment: Rolling walker  Activity Tolerance: Patient tolerated treatment well Patient left: in bed;with call bell/phone within reach  OT Visit Diagnosis: Other abnormalities of gait and mobility (R26.89);Muscle weakness (generalized) (M62.81);Other symptoms and signs involving cognitive function  Time: 2336-1224 OT Time Calculation (min): 18 min Charges:  OT General Charges $OT Visit: 1 Visit OT Evaluation $OT Eval Moderate Complexity: 1 Mod G-Codes:     Norman Herrlich, MS OTR/L  Pager: [place] 04/20/2017, 5:36 PM

## 2017-04-20 NOTE — Evaluation (Signed)
Physical Therapy Evaluation Patient Details Name: Jocelyn Hill. Monica MRN: [id number] DOB: 1950-12-05 Today's Date: 04/20/2017   History of Present Illness  66 year old female patient With known history of severe mitral valve regurgitation and diastolic heart failure recently discharged on 10/6 after a prolonged course for decompensated diastolic heart failure. She was diuresed over 26 L with weight dropping from 143 to 123 at time of discharge. Her diuretics were not continued upon time of discharge Jocelyn Hill presents with a 2 to three-day history of progressive respiratory distress, a BNP of over 30,000,and chest x-ray consistent with pulmonary edema. We will admitted with working diagnosis of decompensated diastolic heart failure, pulmonary edema, and possible healthcare associated pneumonia although this seems less likely.  Clinical Impression  Pt admitted with above diagnosis. Pt currently with functional limitations due to the deficits listed below (see PT Problem List). Pt is currently limited by dizziness with movement. Pt is minA for bed mobility and transfers to RW but was not able to ambulate secondary to increased dizziness which did not appear to be orthostatic in nature. Pt will benefit from skilled PT to increase their independence and safety with mobility to allow discharge to the venue listed below.       Follow Up Recommendations SNF    Equipment Recommendations  Other (comment) (to be determined at next venue)    Recommendations for Other Services OT consult     Precautions / Restrictions Precautions Precautions: Fall Restrictions Weight Bearing Restrictions: No      Mobility  Bed Mobility Overal bed mobility: Needs Assistance Bed Mobility: Sit to Sidelying         Sit to sidelying: Min assist;HOB elevated General bed mobility comments: minA for LE managment into bed, vc for scooting up in bed  Transfers Overall transfer level: Needs assistance Equipment used:  Rolling walker (2 wheeled) Transfers: Sit to/from Stand Sit to Stand: Min assist         General transfer comment: minA for powerup and balancing in RW, c/o of dizziness with standing and pt laid back down   Ambulation/Gait             General Gait Details: did not attempt c/o dizziness with standing     Balance Overall balance assessment: Needs assistance Sitting-balance support: Feet supported;No upper extremity supported Sitting balance-Leahy Scale: Fair     Standing balance support: Bilateral upper extremity supported Standing balance-Leahy Scale: Poor Standing balance comment: walker and minA for steadying                             Pertinent Vitals/Pain Pain Assessment: Faces Faces Pain Scale: Hurts even more Pain Location: L chest pain  Pain Descriptors / Indicators: Aching;Sore;Sharp Pain Intervention(s): Limited activity within patient's tolerance;Monitored during session    Home Living Family/patient expects to be discharged to:: Skilled nursing facility                      Prior Function Level of Independence: Needs assistance   Gait / Transfers Assistance Needed: since last hospital admission pt has done limited household ambulation without AD  ADL's / Homemaking Assistance Needed: pt has not been able to perform bathing and dressing            Extremity/Trunk Assessment   Upper Extremity Assessment Upper Extremity Assessment: LUE deficits/detail LUE Deficits / Details: L UE ROM is limited by pain with movement LUE: Unable to fully assess  due to pain    Lower Extremity Assessment Lower Extremity Assessment: Generalized weakness       Communication   Communication: No difficulties  Cognition Arousal/Alertness: Awake/alert Behavior During Therapy: Flat affect Overall Cognitive Status: Within Functional Limits for tasks assessed                                        General Comments General  comments (skin integrity, edema, etc.): pt with venticular trigemy during session, BP after standing 161/68, HR 77, SaO2 on RA 96%O2        Assessment/Plan    PT Assessment Patient needs continued PT services  PT Problem List Decreased strength;Decreased activity tolerance;Decreased range of motion;Decreased balance;Decreased mobility;Cardiopulmonary status limiting activity;Pain       PT Treatment Interventions DME instruction;Gait training;Functional mobility training;Therapeutic activities;Therapeutic exercise;Balance training;Patient/family education    PT Goals (Current goals can be found in the Care Plan section)  Acute Rehab PT Goals Patient Stated Goal: to get warm again PT Goal Formulation: With patient Time For Goal Achievement: 05/04/17 Potential to Achieve Goals: Fair    Frequency Min 3X/week   Barriers to discharge Decreased caregiver support         AM-PAC PT "6 Clicks" Daily Activity  Outcome Measure Difficulty turning over in bed (including adjusting bedclothes, sheets and blankets)?: A Lot Difficulty moving from lying on back to sitting on the side of the bed? : Unable Difficulty sitting down on and standing up from a chair with arms (e.g., wheelchair, bedside commode, etc,.)?: Unable Help needed moving to and from a bed to chair (including a wheelchair)?: A Little Help needed walking in hospital room?: A Lot Help needed climbing 3-5 steps with a railing? : Total 6 Click Score: 10    End of Session Equipment Utilized During Treatment: Gait belt Activity Tolerance: Patient limited by fatigue;Other (comment) (dizziness) Patient left: in bed;with call bell/phone within reach;with SCD's reapplied Nurse Communication: Mobility status;Other (comment) (dizziness) PT Visit Diagnosis: Unsteadiness on feet (R26.81);Other abnormalities of gait and mobility (R26.89);Muscle weakness (generalized) (M62.81);Difficulty in walking, not elsewhere classified (R26.2);Dizziness  and giddiness (R42);Pain Pain - Right/Left: Left Pain - part of body: Shoulder (chest)    Time: 2355-7322 PT Time Calculation (min) (ACUTE ONLY): 23 min   Charges:   PT Evaluation $PT Eval Moderate Complexity: 1 Mod PT Treatments $Therapeutic Activity: 8-22 mins   PT G Codes:        Nephi Savage B. Migdalia Dk PT, DPT Acute Rehabilitation  [phone] Pager [phone]    [place] 04/20/2017, 10:24 AM

## 2017-04-20 NOTE — Progress Notes (Signed)
S: 66 year old lady history significant for CHF due to severe mitral regurgitation and AKI.patient was feeling much better today, saturating well on room air.  O:BP (!) 145/100   Pulse 85   Temp 98.5 F (36.9 C) (Oral)   Resp 19   Ht 5\' 9"  (1.753 m)   Wt 116 lb 2.9 oz (52.7 kg)   SpO2 93%   BMI 17.16 kg/m   Intake/Output Summary (Last 24 hours) at 04/20/17 0907 Last data filed at 04/20/17 0858  Gross per 24 hour  Intake          1645.58 ml  Output             1367 ml  Net           278.58 ml   Intake/Output: I/O last 3 completed shifts: In: 1972.6 [P.O.:780; I.V.:994.6; IV Piggyback:198] Out: 2102 [Urine:2101; Stool:1]  Intake/Output this shift:  Total I/O In: -  Out: 315 [Urine:315] Weight change: -3 lb 1.4 oz (-1.4 kg) Gen: frial, chronically ill-appearing lady, sitting comfortably in her bed, in no acute distress. CVS: regular rate and rhythm.Gr2/6 holosys M, LV lift Resp: clear bilaterally.decreased bs in bases Abd: soft, non tender,bowel sounds positive.Liver down 6 cm Ext: no edema, no cyanosis, pulses intact and symmetrical.   Recent Labs Lab 04/14/17 0237 04/15/17 0232 04/16/17 0253 04/17/17 0412 04/18/17 0357 04/18/17 2251 04/19/17 0218 04/20/17 0242  NA 131* 131* 133* 134* 134* 133* 135 132*  K 3.5 3.0* 3.1* 3.0* 3.1* 3.6 4.4 3.6  CL 99* 96* 96* 96* 92* 91* 93* 92*  CO2 20* 21* 23 27 30 29 29  32  GLUCOSE 120* 144* 120* 140* 89 111* 84 128*  BUN 44* 45* 41* 36* 32* 31* 28* 29*  CREATININE 3.76* 3.59* 3.52* 3.34* 3.04* 3.51* 2.83* 2.97*  ALBUMIN 2.1* 2.0* 2.0* 2.1* 2.2*  --  2.3* 2.3*  CALCIUM 8.7* 8.5* 9.0 9.2 9.3 9.5 9.6 9.3  PHOS 4.9* 4.6 3.9 2.9 2.5  --  2.7 3.1   Liver Function Tests:  Recent Labs Lab 04/18/17 0357 04/19/17 0218 04/20/17 0242  ALBUMIN 2.2* 2.3* 2.3*   No results for input(s): LIPASE, AMYLASE in the last 168 hours. No results for input(s): AMMONIA in the last 168 hours. CBC:  Recent Labs Lab 04/14/17 0237  04/15/17 0232 04/16/17 0253 04/17/17 0412 04/18/17 2251  WBC 13.6* 9.5 9.2 7.5 7.8  NEUTROABS  --   --   --   --  6.1  HGB 8.4* 8.2* 8.5* 9.0* 10.7*  HCT 24.9* 24.4* 25.1* 27.1* 32.3*  MCV 79.3 78.5 77.7* 78.3 80.0  PLT 219 223 229 265 301   Cardiac Enzymes:  Recent Labs Lab 04/18/17 2251 04/19/17 0527  TROPONINI 0.12* 0.14*   CBG:  Recent Labs Lab 04/19/17 0809 04/19/17 1200 04/19/17 1553 04/19/17 2102 04/20/17 0808  GLUCAP 142* 202* 136* 132* 135*    Iron Studies: No results for input(s): IRON, TIBC, TRANSFERRIN, FERRITIN in the last 72 hours. Studies/Results: Dg Chest Port 1 View  Result Date: 04/19/2017 CLINICAL DATA:  Chest pain tonight EXAM: PORTABLE CHEST 1 VIEW COMPARISON:  04/17/2017 FINDINGS: Cardiac enlargement with vascular congestion. Perihilar infiltrates likely represent edema. Can't exclude superimposed pneumonia in the upper lobes. Infiltrates are less prominent than on previous study suggesting some interval clearing. No blunting of costophrenic angles. No pneumothorax. Calcification of the aorta. Mediastinal contours appear intact. IMPRESSION: Cardiac enlargement with pulmonary vascular congestion and perihilar edema and/or pneumonia, with some improvement of perihilar infiltrates since  previous study. Aortic atherosclerosis. Electronically Signed   By: Lucienne Capers M.D.   On: 04/19/2017 05:37   . amLODipine  10 mg Oral Daily  . aspirin EC  81 mg Oral Daily  . atorvastatin  40 mg Oral Daily  . buPROPion  300 mg Oral Daily  . busPIRone  15 mg Oral BID  . carvedilol  6.25 mg Oral BID WC  . cholecalciferol  1,000 Units Oral Daily  . darbepoetin (ARANESP) injection - NON-DIALYSIS  40 mcg Subcutaneous Q Tue-1800  . docusate sodium  100 mg Oral BID  . doxazosin  2 mg Oral Daily  . DULoxetine  30 mg Oral Daily  . ferrous sulfate  325 mg Oral BID WC  . furosemide  80 mg Oral BID  . hydrALAZINE  100 mg Oral Q8H  . insulin aspart  0-20 Units  Subcutaneous TID WC  . insulin aspart  0-5 Units Subcutaneous QHS  . isosorbide mononitrate  120 mg Oral Daily  . levothyroxine  112 mcg Oral QAC breakfast  . lidocaine  1 patch Transdermal Q24H  . magnesium gluconate  250 mg Oral Daily  . montelukast  10 mg Oral Daily  . pantoprazole  40 mg Oral Daily    BMET    Component Value Date/Time   NA 132 (L) 04/20/2017 0242   K 3.6 04/20/2017 0242   CL 92 (L) 04/20/2017 0242   CO2 32 04/20/2017 0242   GLUCOSE 128 (H) 04/20/2017 0242   BUN 29 (H) 04/20/2017 0242   CREATININE 2.97 (H) 04/20/2017 0242   CALCIUM 9.3 04/20/2017 0242   GFRNONAA 16 (L) 04/20/2017 0242   GFRAA 18 (L) 04/20/2017 0242   CBC    Component Value Date/Time   WBC 7.8 04/18/2017 2251   RBC 4.04 04/18/2017 2251   HGB 10.7 (L) 04/18/2017 2251   HCT 32.3 (L) 04/18/2017 2251   PLT 301 04/18/2017 2251   MCV 80.0 04/18/2017 2251   MCH 26.5 04/18/2017 2251   MCHC 33.1 04/18/2017 2251   RDW 18.5 (H) 04/18/2017 2251   LYMPHSABS 0.8 04/18/2017 2251   MONOABS 0.6 04/18/2017 2251   EOSABS 0.2 04/18/2017 2251   BASOSABS 0.1 04/18/2017 2251     Assessment/Plan:  1. AKI/ CKD Stage 4. Creatinine stable at 2.97,it was 2.83 yesterday.Her lung sounds much improved today and hypoxia resolved.she continued to have good urine output. Her Lasix was transitioned to by mouth 80 mg twice daily.      - increase Lasix to 160 mg by mouth twice daily.      - we will check PTH with tomorrow morning labs.  2.  Acute on chronic diastolic CHF. Most likely because of her severe mitral regurgitation, hypoxia and lung crackles improved today. She will need continuation of diuresis. She will need a rehabilitation facility for her severe deconditioning.she might be considered for mitral valve  replacement once improved.  3. Acute hypoxic respiratory failure. Improved, saturating well on room air today.  4.Anemia. Remained stable, last check was on 04/18/2017. She did get Feraheme and on  oral iron supplement. -We will recheck her iron studies tomorrow morning. -continue iron supplement.  5. Hypertension.blood pressure improving, still mildly elevated,  Doxazosin 2 mg was added by cardiology. Continue imdur, Coreg, amlodipine, Lasix and hydralazine. Should come down with diuresis , do not lower too much.   5 Debillitation.

## 2017-04-21 DIAGNOSIS — E43 Unspecified severe protein-calorie malnutrition: Secondary | ICD-10-CM

## 2017-04-21 LAB — CBC
HCT: 36.6 % (ref 36.0–46.0)
Hemoglobin: 12.1 g/dL (ref 12.0–15.0)
MCH: 26.6 pg (ref 26.0–34.0)
MCHC: 33.1 g/dL (ref 30.0–36.0)
MCV: 80.4 fL (ref 78.0–100.0)
Platelets: 368 10*3/uL (ref 150–400)
RBC: 4.55 MIL/uL (ref 3.87–5.11)
RDW: 18.5 % — ABNORMAL HIGH (ref 11.5–15.5)
WBC: 6.7 10*3/uL (ref 4.0–10.5)

## 2017-04-21 LAB — RENAL FUNCTION PANEL
Albumin: 2.7 g/dL — ABNORMAL LOW (ref 3.5–5.0)
Anion gap: 13 (ref 5–15)
BUN: 28 mg/dL — ABNORMAL HIGH (ref 6–20)
CO2: 30 mmol/L (ref 22–32)
Calcium: 9.7 mg/dL (ref 8.9–10.3)
Chloride: 90 mmol/L — ABNORMAL LOW (ref 101–111)
Creatinine, Ser: 3.14 mg/dL — ABNORMAL HIGH (ref 0.44–1.00)
GFR calc Af Amer: 17 mL/min — ABNORMAL LOW (ref >60)
GFR calc non Af Amer: 14 mL/min — ABNORMAL LOW (ref >60)
Glucose, Bld: 125 mg/dL — ABNORMAL HIGH (ref 65–99)
Phosphorus: 3.4 mg/dL (ref 2.5–4.6)
Potassium: 3.7 mmol/L (ref 3.5–5.1)
Sodium: 133 mmol/L — ABNORMAL LOW (ref 135–145)

## 2017-04-21 LAB — IRON AND TIBC
Iron: 83 ug/dL (ref 28–170)
Saturation Ratios: 39 % — ABNORMAL HIGH (ref 10.4–31.8)
TIBC: 214 ug/dL — ABNORMAL LOW (ref 250–450)
UIBC: 131 ug/dL

## 2017-04-21 LAB — GLUCOSE, CAPILLARY
Glucose-Capillary: 140 mg/dL — ABNORMAL HIGH (ref 65–99)
Glucose-Capillary: 137 mg/dL — ABNORMAL HIGH (ref 65–99)
Glucose-Capillary: 206 mg/dL — ABNORMAL HIGH (ref 65–99)
Glucose-Capillary: 88 mg/dL (ref 65–99)

## 2017-04-21 LAB — MAGNESIUM: Magnesium: 1.9 mg/dL (ref 1.7–2.4)

## 2017-04-21 LAB — FERRITIN: Ferritin: 620 ng/mL — ABNORMAL HIGH (ref 11–307)

## 2017-04-21 MED ORDER — POTASSIUM CHLORIDE CRYS ER 20 MEQ PO TBCR
60.0000 meq | EXTENDED_RELEASE_TABLET | Freq: Once | ORAL | Status: AC
  Administered 2017-04-21: 60 meq via ORAL
  Filled 2017-04-21: qty 3

## 2017-04-21 MED ORDER — CARVEDILOL 6.25 MG PO TABS
6.2500 mg | ORAL_TABLET | Freq: Two times a day (BID) | ORAL | Status: DC
  Administered 2017-04-22 – 2017-04-23 (×4): 6.25 mg via ORAL
  Filled 2017-04-21 (×4): qty 1

## 2017-04-21 NOTE — Progress Notes (Signed)
S:66 year old lady history significant for CHF due to severe mitral regurgitation and AKI. According to patient she was feeling better, denies any chest pain or shortness of breath, saturating well on room air. Creatinine stable with a mild elevation at 3.14  today.  O:BP (!) 146/80   Pulse 71   Temp 98 F (36.7 C) (Oral)   Resp 17   Ht 5\' 9"  (1.753 m)   Wt 120 lb 5.9 oz (54.6 kg)   SpO2 96%   BMI 17.78 kg/m   Intake/Output Summary (Last 24 hours) at 04/21/17 1131 Last data filed at 04/21/17 0700  Gross per 24 hour  Intake              477 ml  Output              700 ml  Net             -223 ml   Intake/Output: I/O last 3 completed shifts: In: 1300.6 [P.O.:462; I.V.:601.6; NG/GT:237] Out: 5329 [JMEQA:8341; Stool:1]  Intake/Output this shift:  No intake/output data recorded. Weight change: 4 lb 3 oz (1.9 kg)   Gen: chronically ill-appearing, frail lady, in no acute distress. CVS: regular rate and rhythm with holosystolic murmur. Resp: bilaterally with decreased breath sounds at bases. Abd: soft, non tender, bowel sounds positive. Ext: No edema, no cyanosis, pulses intact and symmetrical.  Recent Labs Lab 04/15/17 0232 04/16/17 0253 04/17/17 0412 04/18/17 0357 04/18/17 2251 04/19/17 0218 04/20/17 0242 04/21/17 0231  NA 131* 133* 134* 134* 133* 135 132* 133*  K 3.0* 3.1* 3.0* 3.1* 3.6 4.4 3.6 3.7  CL 96* 96* 96* 92* 91* 93* 92* 90*  CO2 21* 23 27 30 29 29  32 30  GLUCOSE 144* 120* 140* 89 111* 84 128* 125*  BUN 45* 41* 36* 32* 31* 28* 29* 28*  CREATININE 3.59* 3.52* 3.34* 3.04* 3.51* 2.83* 2.97* 3.14*  ALBUMIN 2.0* 2.0* 2.1* 2.2*  --  2.3* 2.3* 2.7*  CALCIUM 8.5* 9.0 9.2 9.3 9.5 9.6 9.3 9.7  PHOS 4.6 3.9 2.9 2.5  --  2.7 3.1 3.4   Liver Function Tests:  Recent Labs Lab 04/19/17 0218 04/20/17 0242 04/21/17 0231  ALBUMIN 2.3* 2.3* 2.7*   No results for input(s): LIPASE, AMYLASE in the last 168 hours. No results for input(s): AMMONIA in the last 168  hours. CBC:  Recent Labs Lab 04/15/17 0232 04/16/17 0253 04/17/17 0412 04/18/17 2251 04/21/17 0231  WBC 9.5 9.2 7.5 7.8 6.7  NEUTROABS  --   --   --  6.1  --   HGB 8.2* 8.5* 9.0* 10.7* 12.1  HCT 24.4* 25.1* 27.1* 32.3* 36.6  MCV 78.5 77.7* 78.3 80.0 80.4  PLT 223 229 265 301 368   Cardiac Enzymes:  Recent Labs Lab 04/18/17 2251 04/19/17 0527  TROPONINI 0.12* 0.14*   CBG:  Recent Labs Lab 04/20/17 0808 04/20/17 1148 04/20/17 1658 04/20/17 2114 04/21/17 0743  GLUCAP 135* 166* 165* 138* 140*    Iron Studies:  Recent Labs  04/21/17 0231  IRON 83  TIBC 214*  FERRITIN 620*   Studies/Results: No results found. Marland Kitchen amLODipine  10 mg Oral Daily  . aspirin EC  81 mg Oral Daily  . atorvastatin  40 mg Oral Daily  . buPROPion  300 mg Oral Daily  . busPIRone  15 mg Oral BID  . carvedilol  6.25 mg Oral BID WC  . cholecalciferol  1,000 Units Oral Daily  . darbepoetin (ARANESP) injection - NON-DIALYSIS  40 mcg Subcutaneous Q Tue-1800  . docusate sodium  100 mg Oral BID  . doxazosin  2 mg Oral Daily  . DULoxetine  30 mg Oral Daily  . feeding supplement (NEPRO CARB STEADY)  237 mL Oral BID BM  . ferrous sulfate  325 mg Oral BID WC  . furosemide  160 mg Oral BID  . hydrALAZINE  100 mg Oral Q8H  . insulin aspart  0-20 Units Subcutaneous TID WC  . insulin aspart  0-5 Units Subcutaneous QHS  . isosorbide mononitrate  120 mg Oral Daily  . levothyroxine  112 mcg Oral QAC breakfast  . lidocaine  1 patch Transdermal Q24H  . magnesium gluconate  250 mg Oral Daily  . montelukast  10 mg Oral Daily  . pantoprazole  40 mg Oral BID AC  . potassium chloride  60 mEq Oral Once    BMET    Component Value Date/Time   NA 133 (L) 04/21/2017 0231   K 3.7 04/21/2017 0231   CL 90 (L) 04/21/2017 0231   CO2 30 04/21/2017 0231   GLUCOSE 125 (H) 04/21/2017 0231   BUN 28 (H) 04/21/2017 0231   CREATININE 3.14 (H) 04/21/2017 0231   CALCIUM 9.7 04/21/2017 0231   GFRNONAA 14 (L)  04/21/2017 0231   GFRAA 17 (L) 04/21/2017 0231   CBC    Component Value Date/Time   WBC 6.7 04/21/2017 0231   RBC 4.55 04/21/2017 0231   HGB 12.1 04/21/2017 0231   HCT 36.6 04/21/2017 0231   PLT 368 04/21/2017 0231   MCV 80.4 04/21/2017 0231   MCH 26.6 04/21/2017 0231   MCHC 33.1 04/21/2017 0231   RDW 18.5 (H) 04/21/2017 0231   LYMPHSABS 0.8 04/18/2017 2251   MONOABS 0.6 04/18/2017 2251   EOSABS 0.2 04/18/2017 2251   BASOSABS 0.1 04/18/2017 2251     Assessment/Plan:  1. AKI/ CKD Stage 4. Creatinine stable, mildly increased at 3.14 today, 2.97 yesterday. She continued to make good amount of urine close to 1.5 L. Parathyroid level still pending. - . Continue Lasix 160 mg twice daily. -Continue monitoring renal function. -As patient is stable nephrology will sign off. Will f/u outpatient with Dr. Hollie Salk.  Will need access soon.    2. Acute on chronic diastolic CHF. She needs mitral valve replacement for definitive treatment of her recurrent pulmonary edema. Currently stable, will need good nutrition and rehabilitation. Cardiology is following.  3.Anemia. Hemoglobin improving, iron studies shows improvement after Feraheme. -Continue oral iron supplement.  4.  Hypertension. Improved after addition of Cardura and increasing lasix. Avoid hypotension. 5 debillitation limiting MVR, overall status  Will need rehab

## 2017-04-21 NOTE — Progress Notes (Signed)
CSW met patient at bedside to offer support and discuss disposition plan. Patients son Beverely Low was present during assessment. Patient stated she is willing to discharge back to her SNF. Patient had been at [facility] and admission coordinator Keane Police is aware patient is currently at [facility]. Admission Coordinator stated they will be able to take patient back once medically ready.   Rhea Pink, MSW,  S.N.P.J.

## 2017-04-21 NOTE — Progress Notes (Signed)
Jocelyn Hill  PYK:[id number] DOB: 1950/10/20 DOA: 04/12/2017 PCP: [name], MD    Brief Narrative:  66yo F resident of [facility] SN[facility] in [place] w/ a Hx of Depression, CVA, Chronic Diastolic CHF, Severe Rheumatic Mitral Valve disease, CKD stage IV, Paroxysmal Atrial Fibrillation, HTN, HLD, diet-controlled DM, Hypothyroidism, and a Retroperitoneal Hematoma who was discharged on 10/6 after she was diuresed over 26 L w/ a weight change of 143 to 123 pounds.  She was discharged to a SNF with the plan to receive aggressive rehabilitation, and eventually be seen by dental medicine for dental extractions in hopes of eventually undergoing mitral valve surgery. Her diuretics were stopped at the nursing home and she developed shortness of breath, weakness, increased edema, as well as increase in frequency of chest pain. In the ED she was found to have sats in the 70s, a chest x-ray consistent with pulmonary edema, and a 12-lead showing atrial fibrillation with prolonged QT as well as BNP of over 30,000.   Subjective: The patient denies any chest pain shortness breath fevers chills nausea or vomiting today.  Her affect is flat but she is alert and oriented.  Assessment & Plan:  Acute hypoxic respiratory failure due to pulmonary edema Resolved w/ pt now on RA  Recurrent Epigastric/Chest pain Unclear etiology - appears to have improved w/ tx for dyspepsia - follow   Acute on Chronic Diastolic CHF TTE Sept 5397 noted EF 60-65% w/ severe MR - diuresis per CHF Team and Nephrology - no clinical evidence of significant overload at present  [facility] Weights   04/19/17 0325 04/20/17 0320 04/21/17 0309  Weight: 53.6 kg (118 lb 2.7 oz) 52.7 kg (116 lb 2.9 oz) 54.6 kg (120 lb 5.9 oz)    Acute on CKD stage IV At discharge on 10/6 Cr 3.18 - creatinine climbing with diuresis but still at approximate baseline - care per Nephrology   Recent Labs Lab  04/18/17 0357 04/18/17 2251 04/19/17 0218 04/20/17 0242 04/21/17 0231  CREATININE 3.04* 3.51* 2.83* 2.97* 3.14*    R hydronephrosis due to [facility] Renal U/S with no hydronephrosis this admit - perc neph removed prior to previous d/c   Rheumatic Severe Mitral Valve regurgitation at last admission TCTS stated patient currently not candidate for surgical treatment of severe rheumatic mitral valve regurgitation - will need medical stabilization, physical rehab, cardiac cath, and extensive dental care prior to being a candidate (in that order)  Paroxysmal atrial fibrillation  not on anticoagulation secondary to recent retroperitoneal bleed - off amio due to jxnl rhythm - Cards following - in NSR at time of exam today   HCAP? Questionable pneumonia - completed 7 days of Zosyn - no persisting symptoms to suggest infection  CAD / status post MI (last admission) Not currently a candidate for cardiac cath given renal dysfxn - will eventually need cardiac cath prior to consideration for dental or valve surgery  HLD Cont Lipitor   Anemia of critical illness Holding anticoagulation given most recent retroperitoneal bleed - anemia panel consistent with anemia of chronic disease - Aranesep started per Renal  Hypothyroidism Cont Synthroid  DM2  9/13 A1c 6.2 - CBG reasonably well controlled   Anxiety Continue home meds   Poor dentation per Teena Dunk DDS note 9/13 defered dental extractions in the operating room with general anesthesia at last admission until Cardiology deemed medically stable  Severe malnutrition in context of chronic illness, Underweight Nutrition following  DVT prophylaxis: SCDs Code Status: FULL CODE Family Communication: no family present at time of exam  Disposition Plan: stable for transfer to CHF floor - cont PT/OT - will need SNF placement for long term rehab efforts   Consultants:  PCCM Cardiology  Nephrology   Antimicrobials:  Zosyn 10/14  > 10/19  Objective: Blood pressure (!) 146/80, pulse 71, temperature 98 F (36.7 C), temperature source Oral, resp. rate 17, height 5\' 9"  (1.753 m), weight 54.6 kg (120 lb 5.9 oz), SpO2 96 %.  Intake/Output Summary (Last 24 hours) at 04/21/17 1012 Last data filed at 04/21/17 0700  Gross per 24 hour  Intake              477 ml  Output              700 ml  Net             -223 ml   Filed Weights   04/19/17 0325 04/20/17 0320 04/21/17 0309  Weight: 53.6 kg (118 lb 2.7 oz) 52.7 kg (116 lb 2.9 oz) 54.6 kg (120 lb 5.9 oz)    Examination: General: No acute respiratory distress - alert and oriented  Lungs: Clear to auscultation bilaterally - no wheezing or crackles  Cardiovascular: RRR - no gallup or rub  Abdomen: NT/ND, soft, bs+, no mass  Extremities: No significant C/C/E B LE   CBC:  Recent Labs Lab 04/15/17 0232 04/16/17 0253 04/17/17 0412 04/18/17 2251 04/21/17 0231  WBC 9.5 9.2 7.5 7.8 6.7  NEUTROABS  --   --   --  6.1  --   HGB 8.2* 8.5* 9.0* 10.7* 12.1  HCT 24.4* 25.1* 27.1* 32.3* 36.6  MCV 78.5 77.7* 78.3 80.0 80.4  PLT 223 229 265 301 409   Basic Metabolic Panel:  Recent Labs Lab 04/17/17 0412 04/18/17 0357 04/18/17 2251 04/19/17 0218 04/20/17 0242 04/21/17 0231  NA 134* 134* 133* 135 132* 133*  K 3.0* 3.1* 3.6 4.4 3.6 3.7  CL 96* 92* 91* 93* 92* 90*  CO2 27 30 29 29  32 30  GLUCOSE 140* 89 111* 84 128* 125*  BUN 36* 32* 31* 28* 29* 28*  CREATININE 3.34* 3.04* 3.51* 2.83* 2.97* 3.14*  CALCIUM 9.2 9.3 9.5 9.6 9.3 9.7  MG 1.5* 1.6*  --  1.5* 2.1 1.9  PHOS 2.9 2.5  --  2.7 3.1 3.4   GFR: Estimated Creatinine Clearance: 15.4 mL/min (A) (by C-G formula based on SCr of 3.14 mg/dL (H)).  Liver Function Tests:  Recent Labs Lab 04/17/17 0412 04/18/17 0357 04/19/17 0218 04/20/17 0242 04/21/17 0231  ALBUMIN 2.1* 2.2* 2.3* 2.3* 2.7*    Cardiac Enzymes:  Recent Labs Lab 04/18/17 2251 04/19/17 0527  TROPONINI 0.12* 0.14*    HbA1C: Hgb A1c MFr  Bld  Date/Time Value Ref Range Status  03/12/2017 02:00 PM 6.2 (H) 4.8 - 5.6 % Final    Comment:    (NOTE) Pre diabetes:          5.7%-6.4% Diabetes:              >6.4% Glycemic control for   <7.0% adults with diabetes     CBG:  Recent Labs Lab 04/20/17 0808 04/20/17 1148 04/20/17 1658 04/20/17 2114 04/21/17 0743  GLUCAP 135* 166* 165* 138* 140*    Recent Results (from the past 240 hour(s))  MRSA PCR Screening     Status: None   Collection Time: 04/12/17  4:47 PM  Result Value Ref Range Status  MRSA by PCR NEGATIVE NEGATIVE Final    Comment:        The GeneXpert MRSA Assay (FDA approved for NASAL specimens only), is one component of a comprehensive MRSA colonization surveillance program. It is not intended to diagnose MRSA infection nor to guide or monitor treatment for MRSA infections.   Culture, blood (Routine X 2) w Reflex to ID Panel     Status: None   Collection Time: 04/12/17  7:23 PM  Result Value Ref Range Status   Specimen Description BLOOD RIGHT ANTECUBITAL  Final   Special Requests   Final    BOTTLES DRAWN AEROBIC AND ANAEROBIC Blood Culture adequate volume   Culture NO GROWTH 5 DAYS  Final   Report Status 04/17/2017 FINAL  Final  Culture, blood (Routine X 2) w Reflex to ID Panel     Status: None   Collection Time: 04/12/17  7:28 PM  Result Value Ref Range Status   Specimen Description BLOOD LEFT HAND  Final   Special Requests   Final    BOTTLES DRAWN AEROBIC AND ANAEROBIC Blood Culture results may not be optimal due to an excessive volume of blood received in culture bottles   Culture NO GROWTH 5 DAYS  Final   Report Status 04/17/2017 FINAL  Final     Scheduled Meds: . amLODipine  10 mg Oral Daily  . aspirin EC  81 mg Oral Daily  . atorvastatin  40 mg Oral Daily  . buPROPion  300 mg Oral Daily  . busPIRone  15 mg Oral BID  . carvedilol  6.25 mg Oral BID WC  . cholecalciferol  1,000 Units Oral Daily  . darbepoetin (ARANESP) injection -  NON-DIALYSIS  40 mcg Subcutaneous Q Tue-1800  . docusate sodium  100 mg Oral BID  . doxazosin  2 mg Oral Daily  . DULoxetine  30 mg Oral Daily  . feeding supplement (NEPRO CARB STEADY)  237 mL Oral BID BM  . ferrous sulfate  325 mg Oral BID WC  . furosemide  160 mg Oral BID  . hydrALAZINE  100 mg Oral Q8H  . insulin aspart  0-20 Units Subcutaneous TID WC  . insulin aspart  0-5 Units Subcutaneous QHS  . isosorbide mononitrate  120 mg Oral Daily  . levothyroxine  112 mcg Oral QAC breakfast  . lidocaine  1 patch Transdermal Q24H  . magnesium gluconate  250 mg Oral Daily  . montelukast  10 mg Oral Daily  . pantoprazole  40 mg Oral BID AC     LOS: 9 days   Cherene Altes, MD [facility] Office  [phone] Pager - Text Page per Amion as per below:  On-Call/Text Page:      [email or web address]      password [email or web address]  If 7PM-7AM, please contact night-coverage [email or web address] Password [email or web address] 04/21/2017, 10:12 AM

## 2017-04-21 NOTE — Progress Notes (Signed)
Patient ID: Jocelyn Hill, female   DOB: 1951-06-09, 66 y.o.   MRN: [id number]     Advanced Heart Failure Rounding Note  HF Cardiology: Dr. Aundra Dubin   Subjective:    She remains off oxygen.  Did not walk with PT because of subjective dizziness though she was not orthostatic by BP measurement (SBP in 160s).  No dyspnea.    Creatinine 2.8 -> 2.9 -> 3.14.   Objective:   Weight Range: 120 lb 5.9 oz (54.6 kg) Body mass index is 17.78 kg/m.   Vital Signs:   Temp:  [97.5 F (36.4 C)-98.1 F (36.7 C)] 98 F (36.7 C) (10/23 0726) Pulse Rate:  [52-75] 70 (10/23 0726) Resp:  [12-18] 16 (10/23 0726) BP: (120-159)/(66-88) 159/75 (10/23 0726) SpO2:  [93 %-99 %] 95 % (10/23 0300) Weight:  [120 lb 5.9 oz (54.6 kg)] 120 lb 5.9 oz (54.6 kg) (10/23 0309) Last BM Date: 04/20/17  Weight change: Filed Weights   04/19/17 0325 04/20/17 0320 04/21/17 0309  Weight: 118 lb 2.7 oz (53.6 kg) 116 lb 2.9 oz (52.7 kg) 120 lb 5.9 oz (54.6 kg)    Intake/Output:   Intake/Output Summary (Last 24 hours) at 04/21/17 0828 Last data filed at 04/21/17 0700  Gross per 24 hour  Intake              699 ml  Output             1015 ml  Net             -316 ml      Physical Exam    General: NAD, frail Neck: JVP not elevated, no thyromegaly or thyroid nodule.  Lungs: Clear to auscultation bilaterally with normal respiratory effort. CV: Nondisplaced PMI.  Heart regular S1/S2, no S3/S4, 3/6 HSM apex.  No peripheral edema.   Abdomen: Soft, nontender, no hepatosplenomegaly, no distention.  Skin: Intact without lesions or rashes.  Neurologic: Alert and oriented x 3.  Psych: Normal affect. Extremities: No clubbing or cyanosis.  HEENT: Normal.    Telemetry   NSR in 70s with PVCs, personally reviewed.   EKG    Junctional Rhythm 59 bpm on admit   Labs    CBC  Recent Labs  04/18/17 2251 04/21/17 0231  WBC 7.8 6.7  NEUTROABS 6.1  --   HGB 10.7* 12.1  HCT 32.3* 36.6  MCV 80.0 80.4  PLT 301 368     Basic Metabolic Panel  Recent Labs  04/20/17 0242 04/21/17 0231  NA 132* 133*  K 3.6 3.7  CL 92* 90*  CO2 32 30  GLUCOSE 128* 125*  BUN 29* 28*  CREATININE 2.97* 3.14*  CALCIUM 9.3 9.7  MG 2.1 1.9  PHOS 3.1 3.4   Liver Function Tests  Recent Labs  04/20/17 0242 04/21/17 0231  ALBUMIN 2.3* 2.7*   No results for input(s): LIPASE, AMYLASE in the last 72 hours. Cardiac Enzymes  Recent Labs  04/18/17 2251 04/19/17 0527  TROPONINI 0.12* 0.14*    BNP: BNP (last 3 results)  Recent Labs  03/12/17 1400  BNP 2,230.5*    ProBNP (last 3 results) No results for input(s): PROBNP in the last 8760 hours.   D-Dimer No results for input(s): DDIMER in the last 72 hours. Hemoglobin A1C No results for input(s): HGBA1C in the last 72 hours. Fasting Lipid Panel No results for input(s): CHOL, HDL, LDLCALC, TRIG, CHOLHDL, LDLDIRECT in the last 72 hours. Thyroid Function Tests No results for input(s): TSH,  T4TOTAL, T3FREE, THYROIDAB in the last 72 hours.  Invalid input(s): FREET3  Other results:  Imaging   No results found.  Medications:     Scheduled Medications: . amLODipine  10 mg Oral Daily  . aspirin EC  81 mg Oral Daily  . atorvastatin  40 mg Oral Daily  . buPROPion  300 mg Oral Daily  . busPIRone  15 mg Oral BID  . carvedilol  6.25 mg Oral BID WC  . cholecalciferol  1,000 Units Oral Daily  . darbepoetin (ARANESP) injection - NON-DIALYSIS  40 mcg Subcutaneous Q Tue-1800  . docusate sodium  100 mg Oral BID  . doxazosin  2 mg Oral Daily  . DULoxetine  30 mg Oral Daily  . feeding supplement (NEPRO CARB STEADY)  237 mL Oral BID BM  . ferrous sulfate  325 mg Oral BID WC  . furosemide  160 mg Oral BID  . hydrALAZINE  100 mg Oral Q8H  . insulin aspart  0-20 Units Subcutaneous TID WC  . insulin aspart  0-5 Units Subcutaneous QHS  . isosorbide mononitrate  120 mg Oral Daily  . levothyroxine  112 mcg Oral QAC breakfast  . lidocaine  1 patch Transdermal  Q24H  . magnesium gluconate  250 mg Oral Daily  . montelukast  10 mg Oral Daily  . pantoprazole  40 mg Oral BID AC    Infusions:   PRN Medications: acetaminophen, acetaminophen-codeine, bisacodyl, gi cocktail, morphine injection, nitroGLYCERIN, ondansetron (ZOFRAN) IV  Patient Profile   Jocelyn Hill is a 66 year old with history of MR underlying rheumatic disease, CAD, HTN, DM, hypothyroidism, A fib, severe mitral valve disease, CKD, r hypdronephrosis, and retroperitoneal hematoma.MVR surgery was deferred last admit due to multiple complications.   Admitted with respiratory failure and volume overload  Assessment/Plan   1. Acute Hypoxic Respiratory Failure: Improved, currently on room air.   2. A/C Diastolic Heart Failure: ECHO 02/2017 EF 60-65% with severe MR. Volume status significantly improved, she does not appear volume overloaded.  Creatinine mildly higher today at 3.14.  Ultimately, need mitral valve fixed to keep her out of CHF.  - Nephrology increased her Lasix yesterday to 160 mg bid. May need to back off a bit with rising creatinine and volume ok by exam.  - Would make sure she has lasix ordered at time of discharge to SNF.  3. AKI on CKD Stage IV:  Had R hydronephrosis last month and required perc drain. Renal US 04/14/17 - no evidence of hydronephrosis.  Creatinine mildly higher at 3.1.  New baseline may be in the high 2's.     4. HTN: SBP 130s-150s, will accept this.  Avoid dropping low.   - Continue imdur 120 mg daily. - Continue hydralazine 100 mg three times a day  - Continue amlodipine 10 mg daily.  - Continue coreg 6.25 mg BID. Follow closely for bradycardia, would not increase.  - Continue doxazosin 2 mg daily.   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  Noted on TEE 02/2017.  Ideally, will have surgical valve replacement/repair, but patient is markedly deconditioned and not ready for this yet.  She will also need cardiac cath prior which will be risky because  of CKD stage IV. I think that she is going to have to work extensively with PT/rehab to improve strength and become ambulatory.  At that point, we will plan cardiac cath but with understanding that renal function could progressively worsen.  6. H/O spontaneous retroperitoneal bleed in 02/2017: off  anticoagulants currently.  7.PAF: 02/2017 converted to NSR on amio. Developed RP hematoma so anticoagulants stopped.  Had been in junctional rhythm here and amiodarone stopped.  Went into atrial flutter over last weekend but now back in NSR.  Continue ted hose+ SCDs fo DVT prophylaxis. - On low dose Coreg, would not increase.  8. CAD: Last admit LHC was considered but due to worsening renal failure this was deferred, will need prior to MV surgery.  Had chest pressure in setting of atrial flutter with RVR.  No further CP.   - Continue ASA 81.  - Check lipids in am.  9. ID: Finished course of Zosyn for possible HCAP.  10. Deconditioning: Marked.   - PT consulted. Cardiac rehab consulted.   Disposition: Will need SNF for rehab.      Length of Stay: 9  [name], MD  04/21/2017, 8:28 AM  Advanced Heart Failure Team Pager [phone] (M-F; 7a - 4p)  Please contact [facility] for night-coverage after hours (4p -7a ) and weekends on [email or web address]

## 2017-04-21 NOTE — Plan of Care (Signed)
Problem: Pain Managment: Goal: General experience of comfort will improve Outcome: Progressing Pain dec with tylenol-codeine and morphine  Problem: Physical Regulation: Goal: Ability to maintain clinical measurements within normal limits will improve Outcome: Progressing BP med given  Problem: Nutrition: Goal: Adequate nutrition will be maintained Outcome: Not Progressing Pt doesn't eat much of meals and won't drink all of supplements  Problem: Bowel/Gastric: Goal: Will not experience complications related to bowel motility Outcome: Progressing Docusate given

## 2017-04-22 ENCOUNTER — Inpatient Hospital Stay (HOSPITAL_COMMUNITY): Admission: RE | Admit: 2017-04-22 | Payer: Medicare Other | Source: Ambulatory Visit

## 2017-04-22 DIAGNOSIS — D638 Anemia in other chronic diseases classified elsewhere: Secondary | ICD-10-CM

## 2017-04-22 LAB — RENAL FUNCTION PANEL
Albumin: 2.7 g/dL — ABNORMAL LOW (ref 3.5–5.0)
Anion gap: 13 (ref 5–15)
BUN: 30 mg/dL — ABNORMAL HIGH (ref 6–20)
CO2: 31 mmol/L (ref 22–32)
Calcium: 9.4 mg/dL (ref 8.9–10.3)
Chloride: 89 mmol/L — ABNORMAL LOW (ref 101–111)
Creatinine, Ser: 3 mg/dL — ABNORMAL HIGH (ref 0.44–1.00)
GFR calc Af Amer: 18 mL/min — ABNORMAL LOW (ref >60)
GFR calc non Af Amer: 15 mL/min — ABNORMAL LOW (ref >60)
Glucose, Bld: 138 mg/dL — ABNORMAL HIGH (ref 65–99)
Phosphorus: 4 mg/dL (ref 2.5–4.6)
Potassium: 4.2 mmol/L (ref 3.5–5.1)
Sodium: 133 mmol/L — ABNORMAL LOW (ref 135–145)

## 2017-04-22 LAB — GLUCOSE, CAPILLARY
Glucose-Capillary: 175 mg/dL — ABNORMAL HIGH (ref 65–99)
Glucose-Capillary: 177 mg/dL — ABNORMAL HIGH (ref 65–99)
Glucose-Capillary: 137 mg/dL — ABNORMAL HIGH (ref 65–99)
Glucose-Capillary: 144 mg/dL — ABNORMAL HIGH (ref 65–99)

## 2017-04-22 LAB — LIPID PANEL
Cholesterol: 119 mg/dL (ref 0–200)
HDL: 45 mg/dL (ref >40)
LDL Cholesterol: 48 mg/dL (ref 0–99)
Total CHOL/HDL Ratio: 2.6 RATIO
Triglycerides: 130 mg/dL (ref <150)
VLDL: 26 mg/dL (ref 0–40)

## 2017-04-22 LAB — PTH, INTACT AND CALCIUM
Calcium, Total (PTH): 9.7 mg/dL (ref 8.7–10.3)
PTH: 86 pg/mL — ABNORMAL HIGH (ref 15–65)

## 2017-04-22 LAB — FUNGUS CULTURE WITH STAIN

## 2017-04-22 LAB — MAGNESIUM: Magnesium: 1.9 mg/dL (ref 1.7–2.4)

## 2017-04-22 LAB — FUNGAL ORGANISM REFLEX

## 2017-04-22 LAB — FUNGUS CULTURE RESULT

## 2017-04-22 MED ORDER — LEVOTHYROXINE SODIUM 112 MCG PO TABS
112.0000 ug | ORAL_TABLET | Freq: Every day | ORAL | Status: DC
  Administered 2017-04-23: 112 ug via ORAL
  Filled 2017-04-22: qty 1

## 2017-04-22 NOTE — Progress Notes (Signed)
Pt complains of nausea and vomiting. IV Zofran given.

## 2017-04-22 NOTE — Progress Notes (Signed)
Patient ID: Jocelyn Hill, female   DOB: [birth year], 66 y.o.   MRN: [id number] Patient I    Advanced Heart Failure Rounding Note  HF Cardiology: Dr. Aundra Dubin   Subjective:    She remains off oxygen.  No dyspnea at rest.  No PT yesterday.     1225 cc UOP.   Creatinine 2.8 -> 2.9 -> 3.14 -> 3.0.   Objective:   Weight Range: 116 lb 12.8 oz (53 kg) Body mass index is 17.25 kg/m.   Vital Signs:   Temp:  [97.4 F (36.3 C)-98.1 F (36.7 C)] 98.1 F (36.7 C) (10/24 0522) Pulse Rate:  [53-77] 71 (10/24 0522) Resp:  [9-20] 20 (10/24 0522) BP: (121-146)/(68-83) 130/74 (10/24 0522) SpO2:  [93 %-100 %] 98 % (10/24 0522) Weight:  [116 lb 12.8 oz (53 kg)-117 lb 4.8 oz (53.2 kg)] 116 lb 12.8 oz (53 kg) (10/24 0522) Last BM Date: 04/20/17  Weight change: Filed Weights   04/21/17 0309 04/21/17 1434 04/22/17 0522  Weight: 120 lb 5.9 oz (54.6 kg) 117 lb 4.8 oz (53.2 kg) 116 lb 12.8 oz (53 kg)    Intake/Output:   Intake/Output Summary (Last 24 hours) at 04/22/17 0827 Last data filed at 04/22/17 0238  Gross per 24 hour  Intake             1068 ml  Output             1225 ml  Net             -157 ml      Physical Exam    General: NAD Neck: No JVD, no thyromegaly or thyroid nodule.  Lungs: Clear to auscultation bilaterally with normal respiratory effort. CV: Nondisplaced PMI.  Heart regular S1/S2, no S3/S4, 2/6 HSM apex.  No peripheral edema.   Abdomen: Soft, nontender, no hepatosplenomegaly, no distention.  Skin: Intact without lesions or rashes.  Neurologic: Alert and oriented x 3.  Psych: Normal affect. Extremities: No clubbing or cyanosis.  HEENT: Normal.    Telemetry   NSR in 70s, personally reviewed.   EKG    Junctional Rhythm 59 bpm on admit   Labs    CBC  Recent Labs  04/21/17 0231  WBC 6.7  HGB 12.1  HCT 36.6  MCV 80.4  PLT 867   Basic Metabolic Panel  Recent Labs  04/21/17 0231 04/22/17 0508  NA 133* 133*  K 3.7 4.2  CL 90* 89*  CO2 30 31   GLUCOSE 125* 138*  BUN 28* 30*  CREATININE 3.14* 3.00*  CALCIUM 9.7 9.4  MG 1.9 1.9  PHOS 3.4 4.0   Liver Function Tests  Recent Labs  04/21/17 0231 04/22/17 0508  ALBUMIN 2.7* 2.7*   No results for input(s): LIPASE, AMYLASE in the last 72 hours. Cardiac Enzymes No results for input(s): CKTOTAL, CKMB, CKMBINDEX, TROPONINI in the last 72 hours.  BNP: BNP (last 3 results)  Recent Labs  03/12/17 1400  BNP 2,230.5*    ProBNP (last 3 results) No results for input(s): PROBNP in the last 8760 hours.   D-Dimer No results for input(s): DDIMER in the last 72 hours. Hemoglobin A1C No results for input(s): HGBA1C in the last 72 hours. Fasting Lipid Panel  Recent Labs  04/22/17 0508  CHOL 119  HDL 45  LDLCALC 48  TRIG 130  CHOLHDL 2.6   Thyroid Function Tests No results for input(s): TSH, T4TOTAL, T3FREE, THYROIDAB in the last 72 hours.  Invalid input(s): FREET3  Other results:  Imaging   No results found.  Medications:     Scheduled Medications: . amLODipine  10 mg Oral Daily  . aspirin EC  81 mg Oral Daily  . atorvastatin  40 mg Oral Daily  . buPROPion  300 mg Oral Daily  . busPIRone  15 mg Oral BID  . carvedilol  6.25 mg Oral BID WC  . cholecalciferol  1,000 Units Oral Daily  . darbepoetin (ARANESP) injection - NON-DIALYSIS  40 mcg Subcutaneous Q Tue-1800  . docusate sodium  100 mg Oral BID  . doxazosin  2 mg Oral Daily  . DULoxetine  30 mg Oral Daily  . feeding supplement (NEPRO CARB STEADY)  237 mL Oral BID BM  . ferrous sulfate  325 mg Oral BID WC  . furosemide  160 mg Oral BID  . hydrALAZINE  100 mg Oral Q8H  . insulin aspart  0-20 Units Subcutaneous TID WC  . insulin aspart  0-5 Units Subcutaneous QHS  . isosorbide mononitrate  120 mg Oral Daily  . levothyroxine  112 mcg Oral QAC breakfast  . lidocaine  1 patch Transdermal Q24H  . magnesium gluconate  250 mg Oral Daily  . montelukast  10 mg Oral Daily  . pantoprazole  40 mg Oral BID AC      Infusions:   PRN Medications: acetaminophen, acetaminophen-codeine, bisacodyl, gi cocktail, morphine injection, nitroGLYCERIN, ondansetron (ZOFRAN) IV  Patient Profile   Jocelyn Hill is a 66 year old with history of MR underlying rheumatic disease, CAD, HTN, DM, hypothyroidism, A fib, severe mitral valve disease, CKD, r hypdronephrosis, and retroperitoneal hematoma.MVR surgery was deferred last admit due to multiple complications.   Admitted with respiratory failure and volume overload  Assessment/Plan   1. Acute Hypoxic Respiratory Failure: Resolved, currently on room air.   2. A/C Diastolic Heart Failure: ECHO 02/2017 EF 60-65% with severe MR. Volume status significantly improved, she does not appear volume overloaded.  Creatinine stable at 3.0.  Ultimately, need mitral valve fixed to keep her out of CHF.  - Continue Lasix 160 mg po bid. - Would make sure she has lasix ordered at time of discharge to SNF.  3. AKI on CKD Stage IV:  Had R hydronephrosis last month and required perc drain. Renal US 04/14/17 - no evidence of hydronephrosis.  Creatinine stable.  New baseline may be in the high 2's-3.     - Will need renal followup.  4. HTN: SBP in 130s on current regimen.  Avoid dropping BP low.    - Continue imdur 120 mg daily. - Continue hydralazine 100 mg three times a day  - Continue amlodipine 10 mg daily.  - Continue coreg 6.25 mg BID. Follow closely for bradycardia, would not increase.  - Continue doxazosin 2 mg daily.   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  Noted on TEE 02/2017.  Ideally, will have surgical valve replacement/repair, but patient is markedly deconditioned and not ready for this yet.  She will also need cardiac cath prior which will be risky because of CKD stage IV. I think that she is going to have to work extensively with PT/rehab to improve strength and become ambulatory.  At that point, we will plan cardiac cath but with understanding that renal function  could progressively worsen.  6. H/O spontaneous retroperitoneal bleed in 02/2017: off anticoagulants currently.  7.PAF: 02/2017 converted to NSR on amio. Developed RP hematoma so anticoagulants stopped.  Had been in junctional rhythm here and amiodarone stopped.  Went into atrial flutter over last weekend but now back in NSR.  Continue ted hose+ SCDs for DVT prophylaxis. - On low dose Coreg, would not increase.  8. CAD: Last admit LHC was considered but due to worsening renal failure this was deferred, will need prior to MV surgery.  Had chest pressure in setting of atrial flutter with RVR.  No further CP.   - Continue ASA 81.  - LDL low at 48, would not add statin.  9. ID: Finished course of Zosyn for possible HCAP.  10. Deconditioning: Marked.   - PT consulted, did not work with her yesterday.  Will need PT daily.   Disposition: Will need SNF for rehab. Should be getting close.    Length of Stay: [place], MD  04/22/2017, 8:27 AM  Advanced Heart Failure Team Pager [phone] (M-F; 7a - 4p)  Please contact [facility] for night-coverage after hours (4p -7a ) and weekends on [email or web address]

## 2017-04-22 NOTE — Progress Notes (Addendum)
PROGRESS NOTE    Jocelyn Hill. 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 During this hospitalization she was diuresed over 26 L her weight went from 143 pounds on admission down to 123 she was deemed euvolemic upon time of discharge. Following discharge she was discharged to a skilled nursing facility with the plan to receive aggressive rehabilitation, and eventually be seen by dental medicine for dental extractions in hopes of eventually undergoing mitral valve surgery should she recover to the point thoracic surgery team for a potential candidate. Her diuretics were stopped upon time of admission to the nursing home, possibly 3 days prior to presentation at [facility] emergency rooms he began to notice increased shortness of breath, a cough with some blood in her sputum, increased weakness, increased swelling, and her lower extremities as well as increase in frequency of chest pain primarily substernal. In the emergency room she was found to have saturations in the 70s chest x-ray consistent with pulmonary edema per report 12-lead showing atrial fibrillation with prolonged QT as well as BNP of over 30,000. Given her prolonged recent hospital stay, and multiple comorbidities as well as prior workup done here, on admission to our intensive care was requested.       Subjective: 10/24 A/O 4, extremely weak, positive CP rated 4/10 (chronic) , negative SOB, negative abdominal pain, positive N/V (chronic)     Assessment & Plan:   Active Problems:   Acute heart failure (HCC)   Acute pulmonary  edema (HCC)   Protein-calorie malnutrition, severe  HCAP? -questionable pneumonia complete 7 days of Zosyn  Acute respiratory failure with hypoxia -Diffuse pulmonary infiltrates most likely secondary to decompensated diastolic CHF with pulmonary edema. -Titrate O2 to maintain SPO2> 93%  Acute on Chronic Diastolic CHF -Strict in and out since admission -8.5 L -Daily weight Filed Weights   04/21/17 0309 04/21/17 1434 04/22/17 0522  Weight: 120 lb 5.9 oz (54.6 kg) 117 lb 4.8 oz (53.2 kg) 116 lb 12.8 oz (53 kg)  -Aggressive diuresis Lasix 160 mg TID -Amlodipine 10 mg daily -ASA 81 mg daily -Coreg 6.25 mg BID -Doxazosin 2 mg daily -Lasix 160 mg BID -Hydralazine 100 mg TID -Imdur 120 mg daily -NTG SL PRN -transfuse for hemoglobin< 8 -consult to nutrition for education on heart healthy diet/low sodium diet  Acute Pulmonary edema -Continue aggressive diuresis C CHF  Paroxysmal atrial fibrillation  -Not on anticoagulation secondary to retroperitoneal bleed. -rate controled  CAD/status post MI (last admission) See CHF  Elevated troponin/Chest Pain -Slightly elevated troponin    Recent Labs Lab 04/18/17 2251 04/19/17 0527  TROPONINI 0.12* 0.14*  -not fully convinced totally cardiac in nature(component of demand ischemia?). On exam a portion of the chest pain is reproducible. In addition given patient's poor kidney function would not tolerate a cardiac catheterization in any event.. -medication changes per cardiology recommendations  Rheumatic Severe Mitral Valve regurgitation  -at last admission cardio thoracic surgery stated patient currently not candidate for surgical treatment of severe rheumatic mitral valve regurgitation -Per CHF team note from last admission patient needs aggressive CHF management, followed by cardiac catheterization, as well as dental service consultation prior to surgery consideration.  HLD -Lipitor 40  mg daily  Acute on CKD stage IV(Upon  discharge on 10/6 Cr= 3.18)   Recent Labs Lab 04/18/17 0357 04/18/17 2251 04/19/17 0218 04/20/17 0242 04/21/17 0231 04/22/17 0508  CREATININE 3.04* 3.51* 2.83* 2.97* 3.14* 3.00*  -with aggressive diuresis creatinine is better than at previous discharge. -Renal U/S with no hydronephrosis, LEFT renal atrophy, echogenic renal parenchyma (c/w renal disease)  Anemia of critical illness.   -No evidence of bleeding.  -Continue to hold anticoagulation secondary to retroperitoneal bleed -anemia panel consistent with anemia of chronic disease transfuse for Hgb <8.0 -Arnesep started per renal   Hypothyroidism -Synthroid   Diabetes type 2 Controlled with Renal Complication -0/26 Hemoglobin A1c= 6.2 -Resistant SSI  Anxiety - Continue BuSpar and Wellbutrin, as well as Cymbalta  Hypokalemia -Potassium goal> 4  Hypomagnesemia -Magnesium goal> 2  Poor dentation -per Teena Dunk DDS note 9/13   defered  dental extractions in the operating room with general anesthesia at last admission until cardiology deems  patient more medically stable.  -Given that patient was only able to remain out of hospital for~ a week would attempt to have teeth removed at this admission if possible in order to move forward with MV replacement. Cardiac status will need to be stabilized first.  Goals of care -Appears discussion between cardiology, cardiothoracic surgery, and dental service as well as primary team decision is as follows patient to be discharged to SNF hopefully to gain enough strength for cardiac catheterization followed by major dental surgery to extract all teeth culminating in repair of mitral valve. NOTE given patient's current status not hopeful that she will be able to survive all required steps. Patient understands she is a very tenuous position.       .  DVT prophylaxis: SCD Code Status: full Family Communication: none Disposition Plan: Discharge on 10/25   Consultants:  [facility]  M Cardiology    Procedures/Significant Events:     I have personally reviewed and interpreted all radiology studies and my findings are as above.  VENTILATOR SETTINGS: none   Cultures none  Antimicrobials: Anti-infectives    Start     Stop   04/14/17 0700  vancomycin (VANCOCIN) IVPB 750 mg/150 ml premix  Status:  Discontinued     04/14/17 0826   04/13/17 0500  piperacillin-tazobactam (ZOSYN) IVPB 2.25 g     04/17/17 2035   04/12/17 1930  vancomycin (VANCOCIN) IVPB 1000 mg/200 mL premix  Status:  Discontinued     04/12/17 1915   04/12/17 1930  piperacillin-tazobactam (ZOSYN) IVPB 3.375 g     04/12/17 2052       Devices    LINES / TUBES:      Continuous Infusions:    Objective: Vitals:   04/21/17 1434 04/21/17 2000 04/22/17 0044 04/22/17 0522  BP: 121/68 123/78 137/80 130/74  Pulse: (!) 53 77 71 71  Resp: 18   20  Temp:  (!) 97.5 F (36.4 C) 98.1 F (36.7 C) 98.1 F (36.7 C)  TempSrc:  Oral Oral Oral  SpO2: 99% 97% 100% 98%  Weight: 117 lb 4.8 oz (53.2 kg)   116 lb 12.8 oz (53 kg)  Height: 5\' 9"  (1.753 m)       Intake/Output Summary (Last 24 hours) at 04/22/17 0823 Last data filed at 04/22/17 0238  Gross per 24 hour  Intake             1068 ml  Output  1225 ml  Net             -157 ml   Filed Weights   04/21/17 0309 04/21/17 1434 04/22/17 0522  Weight: 120 lb 5.9 oz (54.6 kg) 117 lb 4.8 oz (53.2 kg) 116 lb 12.8 oz (53 kg)      ,Physical Exam:  General: A/O 4, No acute respiratory distress, cachectic, weak Neck:  Negative scars, masses, torticollis, lymphadenopathy, JVD Lungs: Clear to auscultation bilaterally without wheezes or crackles Cardiovascular: Regular rate and rhythm without murmur gallop or rub normal S1 and S2 Abdomen: negative abdominal pain, nondistended, positive soft, bowel sounds, no rebound, no ascites, no appreciable mass Extremities: No significant cyanosis, clubbing, or edema bilateral lower  extremities Skin: Negative rashes, lesions, ulcers Psychiatric:  Negative depression, negative anxiety, negative fatigue, negative mania  Central nervous system:  Cranial nerves II through XII intact, tongue/uvula midline, all extremities muscle strength 5/5, sensation intact throughout,negative expressive aphasia, negative receptive aphasia..     Data Reviewed: Care during the described time interval was provided by me .  I have reviewed this patient's available data, including medical history, events of note, physical examination, and all test results as part of my evaluation.   CBC:  Recent Labs Lab 04/16/17 0253 04/17/17 0412 04/18/17 2251 04/21/17 0231  WBC 9.2 7.5 7.8 6.7  NEUTROABS  --   --  6.1  --   HGB 8.5* 9.0* 10.7* 12.1  HCT 25.1* 27.1* 32.3* 36.6  MCV 77.7* 78.3 80.0 80.4  PLT 229 265 301 397   Basic Metabolic Panel:  Recent Labs Lab 04/18/17 0357 04/18/17 2251 04/19/17 0218 04/20/17 0242 04/21/17 0231 04/22/17 0508  NA 134* 133* 135 132* 133* 133*  K 3.1* 3.6 4.4 3.6 3.7 4.2  CL 92* 91* 93* 92* 90* 89*  CO2 30 29 29  32 30 31  GLUCOSE 89 111* 84 128* 125* 138*  BUN 32* 31* 28* 29* 28* 30*  CREATININE 3.04* 3.51* 2.83* 2.97* 3.14* 3.00*  CALCIUM 9.3 9.5 9.6 9.3 9.7 9.4  MG 1.6*  --  1.5* 2.1 1.9 1.9  PHOS 2.5  --  2.7 3.1 3.4 4.0   GFR: Estimated Creatinine Clearance: 15.6 mL/min (A) (by C-G formula based on SCr of 3 mg/dL (H)). Liver Function Tests:  Recent Labs Lab 04/18/17 0357 04/19/17 0218 04/20/17 0242 04/21/17 0231 04/22/17 0508  ALBUMIN 2.2* 2.3* 2.3* 2.7* 2.7*   No results for input(s): LIPASE, AMYLASE in the last 168 hours. No results for input(s): AMMONIA in the last 168 hours. Coagulation Profile: No results for input(s): INR, PROTIME in the last 168 hours. Cardiac Enzymes:  Recent Labs Lab 04/18/17 2251 04/19/17 0527  TROPONINI 0.12* 0.14*   BNP (last 3 results) No results for input(s): PROBNP in the last 8760  hours. HbA1C: No results for input(s): HGBA1C in the last 72 hours. CBG:  Recent Labs Lab 04/21/17 0743 04/21/17 1212 04/21/17 1809 04/21/17 2230 04/22/17 0732  GLUCAP 140* 137* 206* 88 175*   Lipid Profile:  Recent Labs  04/22/17 0508  CHOL 119  HDL 45  LDLCALC 48  TRIG 130  CHOLHDL 2.6   Thyroid Function Tests: No results for input(s): TSH, T4TOTAL, FREET4, T3FREE, THYROIDAB in the last 72 hours. Anemia Panel:  Recent Labs  04/21/17 0231  FERRITIN 620*  TIBC 214*  IRON 83   Urine analysis:    Component Value Date/Time   COLORURINE YELLOW 03/26/2017 1539   APPEARANCEUR CLOUDY (A) 03/26/2017 1539   LABSPEC  1.010 03/26/2017 1539   PHURINE 6.5 03/26/2017 1539   GLUCOSEU NEGATIVE 03/26/2017 1539   HGBUR LARGE (A) 03/26/2017 1539   BILIRUBINUR NEGATIVE 03/26/2017 1539   KETONESUR NEGATIVE 03/26/2017 1539   PROTEINUR >300 (A) 03/26/2017 1539   NITRITE NEGATIVE 03/26/2017 1539   LEUKOCYTESUR LARGE (A) 03/26/2017 1539   Sepsis Labs: @LABRCNTIP (procalcitonin:4,lacticidven:4)  ) Recent Results (from the past 240 hour(s))  MRSA PCR Screening     Status: None   Collection Time: 04/12/17  4:47 PM  Result Value Ref Range Status   MRSA by PCR NEGATIVE NEGATIVE Final    Comment:        The GeneXpert MRSA Assay (FDA approved for NASAL specimens only), is one component of a comprehensive MRSA colonization surveillance program. It is not intended to diagnose MRSA infection nor to guide or monitor treatment for MRSA infections.   Culture, blood (Routine X 2) w Reflex to ID Panel     Status: None   Collection Time: 04/12/17  7:23 PM  Result Value Ref Range Status   Specimen Description BLOOD RIGHT ANTECUBITAL  Final   Special Requests   Final    BOTTLES DRAWN AEROBIC AND ANAEROBIC Blood Culture adequate volume   Culture NO GROWTH 5 DAYS  Final   Report Status 04/17/2017 FINAL  Final  Culture, blood (Routine X 2) w Reflex to ID Panel     Status: None    Collection Time: 04/12/17  7:28 PM  Result Value Ref Range Status   Specimen Description BLOOD LEFT HAND  Final   Special Requests   Final    BOTTLES DRAWN AEROBIC AND ANAEROBIC Blood Culture results may not be optimal due to an excessive volume of blood received in culture bottles   Culture NO GROWTH 5 DAYS  Final   Report Status 04/17/2017 FINAL  Final         Radiology Studies: No results found.      Scheduled Meds: . amLODipine  10 mg Oral Daily  . aspirin EC  81 mg Oral Daily  . atorvastatin  40 mg Oral Daily  . buPROPion  300 mg Oral Daily  . busPIRone  15 mg Oral BID  . carvedilol  6.25 mg Oral BID WC  . cholecalciferol  1,000 Units Oral Daily  . darbepoetin (ARANESP) injection - NON-DIALYSIS  40 mcg Subcutaneous Q Tue-1800  . docusate sodium  100 mg Oral BID  . doxazosin  2 mg Oral Daily  . DULoxetine  30 mg Oral Daily  . feeding supplement (NEPRO CARB STEADY)  237 mL Oral BID BM  . ferrous sulfate  325 mg Oral BID WC  . furosemide  160 mg Oral BID  . hydrALAZINE  100 mg Oral Q8H  . insulin aspart  0-20 Units Subcutaneous TID WC  . insulin aspart  0-5 Units Subcutaneous QHS  . isosorbide mononitrate  120 mg Oral Daily  . levothyroxine  112 mcg Oral QAC breakfast  . lidocaine  1 patch Transdermal Q24H  . magnesium gluconate  250 mg Oral Daily  . montelukast  10 mg Oral Daily  . pantoprazole  40 mg Oral BID AC   Continuous Infusions:    LOS: 10 days    Time spent: 40 minutes    WOODS, Geraldo Docker, MD [facility] Pager [phone]   If 7PM-7AM, please contact night-coverage [email or web address] Password [email or web address] 04/22/2017, 8:23 AM

## 2017-04-22 NOTE — Progress Notes (Signed)
Physical Therapy Treatment Patient Details Name: Jocelyn Hill. Diven MRN: [id number] DOB: 1950/08/07 Today's Date: 04/22/2017    History of Present Illness 66 year old female patient With known history of severe mitral valve regurgitation and diastolic heart failure recently discharged on 10/6 after a prolonged course for decompensated diastolic heart failure. She was diuresed over 26 L with weight dropping from 143 to 123 at time of discharge. Her diuretics were not continued upon time of discharge. She presents with a 2 to three-day history of progressive respiratory distress, a BNP of over 30,000,and chest x-ray consistent with pulmonary edema. Admitted with working diagnosis of decompensated diastolic heart failure, pulmonary edema, and possible healthcare associated pneumonia although this seems less likely.    PT Comments    Patient agreeable to attempt gait this session however limited by dizziness and nausea with emesis. Pt tolerated ambulating 24ft with HHA +2. Pt with BP of 133/86 in sitting and 120/80 in standing. RN notified of pt's nausea and emesis and pt given extra emesis bags and ginger ale end of session with bed in chair position.     Follow Up Recommendations  SNF     Equipment Recommendations  Other (comment) (to be determined at next venue)    Recommendations for Other Services OT consult     Precautions / Restrictions Precautions Precautions: Fall Restrictions Weight Bearing Restrictions: No    Mobility  Bed Mobility Overal bed mobility: Modified Independent             General bed mobility comments: increased time and effort  Transfers Overall transfer level: Needs assistance Equipment used: 2 person hand held assist Transfers: Sit to/from Stand Sit to Stand: Min assist         General transfer comment: min A X2 from EOB; assist for balance upon standing; pt with dizziness in standing and required sitting back down after first  trial  Ambulation/Gait Ambulation/Gait assistance: Min assist;+2 safety/equipment Ambulation Distance (Feet): 18 Feet Assistive device: 2 person hand held assist Gait Pattern/deviations: Step-through pattern;Decreased step length - right;Decreased step length - left;Decreased stride length Gait velocity: decr   General Gait Details: pt c/o dizziness and nausea and emesis upon return to sitting EOB; HHA +2 for stability but pt not heavily reliant on UE support for ambulation of short distance   Stairs            Wheelchair Mobility    Modified Rankin (Stroke Patients Only)       Balance Overall balance assessment: Needs assistance Sitting-balance support: Feet supported;No upper extremity supported Sitting balance-Leahy Scale: Good     Standing balance support: Bilateral upper extremity supported Standing balance-Leahy Scale: Poor                              Cognition Arousal/Alertness: Awake/alert Behavior During Therapy: Flat affect Overall Cognitive Status: Within Functional Limits for tasks assessed                                 General Comments: pt slow to respond but responded appropriately and following all commands appropriately      Exercises      General Comments General comments (skin integrity, edema, etc.): BP sitting EOB 133/86 and in standing 120/80; pt with nausea and emesis and RN aware       Pertinent Vitals/Pain Pain Assessment: No/denies pain    Home Living  Prior Function            PT Goals (current goals can now be found in the care plan section) Acute Rehab PT Goals PT Goal Formulation: With patient Time For Goal Achievement: 05/04/17 Potential to Achieve Goals: Fair Progress towards PT goals: Progressing toward goals    Frequency    Min 3X/week      PT Plan Current plan remains appropriate    Co-evaluation              AM-PAC PT "6 Clicks" Daily  Activity  Outcome Measure  Difficulty turning over in bed (including adjusting bedclothes, sheets and blankets)?: A Lot Difficulty moving from lying on back to sitting on the side of the bed? : A Lot Difficulty sitting down on and standing up from a chair with arms (e.g., wheelchair, bedside commode, etc,.)?: Unable Help needed moving to and from a bed to chair (including a wheelchair)?: A Little Help needed walking in hospital room?: A Little Help needed climbing 3-5 steps with a railing? : A Lot 6 Click Score: 13    End of Session Equipment Utilized During Treatment: Gait belt Activity Tolerance: Other (comment) (limited by nausea and emesis) Patient left: in bed;with call bell/phone within reach (bed in chair position) Nurse Communication: Mobility status;Other (comment) (RN notified of pt's nausea with emesis) PT Visit Diagnosis: Unsteadiness on feet (R26.81);Other abnormalities of gait and mobility (R26.89);Muscle weakness (generalized) (M62.81);Difficulty in walking, not elsewhere classified (R26.2);Dizziness and giddiness (R42);Pain Pain - Right/Left: Left Pain - part of body: Shoulder (chest)     Time: 3094-0768 PT Time Calculation (min) (ACUTE ONLY): 29 min  Charges:  $Gait Training: 8-22 mins $Therapeutic Activity: 8-22 mins                    G Codes:       Jocelyn Hill, PTA Pager: [phone]     Darliss Cheney 04/22/2017, 10:55 AM

## 2017-04-23 DIAGNOSIS — F419 Anxiety disorder, unspecified: Secondary | ICD-10-CM

## 2017-04-23 DIAGNOSIS — R778 Other specified abnormalities of plasma proteins: Secondary | ICD-10-CM

## 2017-04-23 DIAGNOSIS — E1121 Type 2 diabetes mellitus with diabetic nephropathy: Secondary | ICD-10-CM

## 2017-04-23 DIAGNOSIS — I051 Rheumatic mitral insufficiency: Secondary | ICD-10-CM

## 2017-04-23 DIAGNOSIS — I480 Paroxysmal atrial fibrillation: Secondary | ICD-10-CM

## 2017-04-23 DIAGNOSIS — R748 Abnormal levels of other serum enzymes: Secondary | ICD-10-CM

## 2017-04-23 DIAGNOSIS — R7989 Other specified abnormal findings of blood chemistry: Secondary | ICD-10-CM

## 2017-04-23 DIAGNOSIS — I2510 Atherosclerotic heart disease of native coronary artery without angina pectoris: Secondary | ICD-10-CM

## 2017-04-23 DIAGNOSIS — N184 Chronic kidney disease, stage 4 (severe): Secondary | ICD-10-CM

## 2017-04-23 DIAGNOSIS — E7849 Other hyperlipidemia: Secondary | ICD-10-CM

## 2017-04-23 DIAGNOSIS — I5031 Acute diastolic (congestive) heart failure: Secondary | ICD-10-CM

## 2017-04-23 DIAGNOSIS — N1330 Unspecified hydronephrosis: Secondary | ICD-10-CM

## 2017-04-23 DIAGNOSIS — N179 Acute kidney failure, unspecified: Secondary | ICD-10-CM

## 2017-04-23 LAB — CBC
HCT: 33.6 % — ABNORMAL LOW (ref 36.0–46.0)
Hemoglobin: 11.3 g/dL — ABNORMAL LOW (ref 12.0–15.0)
MCH: 26.8 pg (ref 26.0–34.0)
MCHC: 33.6 g/dL (ref 30.0–36.0)
MCV: 79.6 fL (ref 78.0–100.0)
Platelets: 296 10*3/uL (ref 150–400)
RBC: 4.22 MIL/uL (ref 3.87–5.11)
RDW: 17.9 % — ABNORMAL HIGH (ref 11.5–15.5)
WBC: 6.1 10*3/uL (ref 4.0–10.5)

## 2017-04-23 LAB — GLUCOSE, CAPILLARY
Glucose-Capillary: 109 mg/dL — ABNORMAL HIGH (ref 65–99)
Glucose-Capillary: 177 mg/dL — ABNORMAL HIGH (ref 65–99)
Glucose-Capillary: 108 mg/dL — ABNORMAL HIGH (ref 65–99)

## 2017-04-23 LAB — RENAL FUNCTION PANEL
Albumin: 2.7 g/dL — ABNORMAL LOW (ref 3.5–5.0)
Anion gap: 11 (ref 5–15)
BUN: 30 mg/dL — ABNORMAL HIGH (ref 6–20)
CO2: 31 mmol/L (ref 22–32)
Calcium: 9.1 mg/dL (ref 8.9–10.3)
Chloride: 90 mmol/L — ABNORMAL LOW (ref 101–111)
Creatinine, Ser: 2.98 mg/dL — ABNORMAL HIGH (ref 0.44–1.00)
GFR calc Af Amer: 18 mL/min — ABNORMAL LOW (ref >60)
GFR calc non Af Amer: 15 mL/min — ABNORMAL LOW (ref >60)
Glucose, Bld: 110 mg/dL — ABNORMAL HIGH (ref 65–99)
Phosphorus: 4.5 mg/dL (ref 2.5–4.6)
Potassium: 3.4 mmol/L — ABNORMAL LOW (ref 3.5–5.1)
Sodium: 132 mmol/L — ABNORMAL LOW (ref 135–145)

## 2017-04-23 LAB — TROPONIN I: Troponin I: 0.07 ng/mL (ref <0.03)

## 2017-04-23 MED ORDER — DULOXETINE HCL 30 MG PO CPEP: 30.0000 mg | ORAL_CAPSULE | Freq: Every day | ORAL | 0 refills | Status: DC

## 2017-04-23 MED ORDER — MONTELUKAST SODIUM 10 MG PO TABS: 10.0000 mg | ORAL_TABLET | Freq: Every day | ORAL | 0 refills | Status: AC

## 2017-04-23 MED ORDER — CARVEDILOL 6.25 MG PO TABS: 6.2500 mg | ORAL_TABLET | Freq: Two times a day (BID) | ORAL | 0 refills | Status: DC

## 2017-04-23 MED ORDER — LIDOCAINE 5 % EX PTCH: 1.0000 | MEDICATED_PATCH | CUTANEOUS | 0 refills | Status: DC

## 2017-04-23 MED ORDER — POTASSIUM CHLORIDE CRYS ER 20 MEQ PO TBCR
60.0000 meq | EXTENDED_RELEASE_TABLET | Freq: Once | ORAL | Status: AC
  Administered 2017-04-23: 60 meq via ORAL
  Filled 2017-04-23: qty 3

## 2017-04-23 MED ORDER — LEVOTHYROXINE SODIUM 112 MCG PO TABS: 112.0000 ug | ORAL_TABLET | Freq: Every day | ORAL | 0 refills | Status: DC

## 2017-04-23 MED ORDER — HYDRALAZINE HCL 100 MG PO TABS: 100.0000 mg | ORAL_TABLET | Freq: Three times a day (TID) | ORAL | 0 refills | Status: DC

## 2017-04-23 MED ORDER — ONDANSETRON HCL 4 MG PO TABS: 4.0000 mg | ORAL_TABLET | Freq: Three times a day (TID) | ORAL | 0 refills | Status: DC | PRN

## 2017-04-23 MED ORDER — NITROGLYCERIN 0.4 MG SL SUBL: 0.4000 mg | SUBLINGUAL_TABLET | SUBLINGUAL | 0 refills | Status: DC | PRN

## 2017-04-23 MED ORDER — BUPROPION HCL ER (XL) 300 MG PO TB24: 300.0000 mg | ORAL_TABLET | Freq: Every day | ORAL | 0 refills | Status: DC

## 2017-04-23 MED ORDER — MAGNESIUM GLUCONATE 500 MG PO TABS: 250.0000 mg | ORAL_TABLET | Freq: Every day | ORAL | 0 refills | Status: DC

## 2017-04-23 MED ORDER — ATORVASTATIN CALCIUM 40 MG PO TABS: 40.0000 mg | ORAL_TABLET | Freq: Every day | ORAL | 0 refills | Status: DC

## 2017-04-23 MED ORDER — INSULIN ASPART 100 UNIT/ML ~~LOC~~ SOLN: 0.0000 [IU] | Freq: Three times a day (TID) | SUBCUTANEOUS | 11 refills | Status: DC

## 2017-04-23 MED ORDER — DOCUSATE SODIUM 100 MG PO CAPS: 100.0000 mg | ORAL_CAPSULE | Freq: Two times a day (BID) | ORAL | 0 refills | Status: DC

## 2017-04-23 MED ORDER — ASPIRIN 81 MG PO TBEC: 81.0000 mg | DELAYED_RELEASE_TABLET | Freq: Every day | ORAL | 0 refills | Status: DC

## 2017-04-23 MED ORDER — ISOSORBIDE MONONITRATE ER 120 MG PO TB24: 120.0000 mg | ORAL_TABLET | Freq: Every day | ORAL | 0 refills | Status: DC

## 2017-04-23 MED ORDER — AMLODIPINE BESYLATE 10 MG PO TABS: 10.0000 mg | ORAL_TABLET | Freq: Every day | ORAL | 0 refills | Status: DC

## 2017-04-23 MED ORDER — DOXAZOSIN MESYLATE 2 MG PO TABS: 2.0000 mg | ORAL_TABLET | Freq: Every day | ORAL | 0 refills | Status: DC

## 2017-04-23 MED ORDER — NEPRO/CARBSTEADY PO LIQD: 237.0000 mL | Freq: Two times a day (BID) | ORAL | 0 refills | Status: DC

## 2017-04-23 MED ORDER — FERROUS SULFATE 325 (65 FE) MG PO TABS: 325.0000 mg | ORAL_TABLET | Freq: Two times a day (BID) | ORAL | 0 refills | Status: DC

## 2017-04-23 MED ORDER — OXYCODONE HCL 5 MG PO TABS: 5.0000 mg | ORAL_TABLET | Freq: Three times a day (TID) | ORAL | 0 refills | Status: DC | PRN

## 2017-04-23 MED ORDER — ACETAMINOPHEN 325 MG PO TABS: 650.0000 mg | ORAL_TABLET | Freq: Four times a day (QID) | ORAL | 0 refills | Status: DC | PRN

## 2017-04-23 MED ORDER — BUSPIRONE HCL 15 MG PO TABS: 15.0000 mg | ORAL_TABLET | Freq: Two times a day (BID) | ORAL | 0 refills | Status: AC

## 2017-04-23 MED ORDER — DARBEPOETIN ALFA 40 MCG/0.4ML IJ SOSY: 40.0000 ug | PREFILLED_SYRINGE | INTRAMUSCULAR | 0 refills | Status: DC

## 2017-04-23 MED ORDER — PANTOPRAZOLE SODIUM 40 MG PO TBEC: 40.0000 mg | DELAYED_RELEASE_TABLET | Freq: Two times a day (BID) | ORAL | 0 refills | Status: AC

## 2017-04-23 MED ORDER — FUROSEMIDE 80 MG PO TABS: 160.0000 mg | ORAL_TABLET | Freq: Two times a day (BID) | ORAL | 0 refills | Status: DC

## 2017-04-23 NOTE — Progress Notes (Signed)
[place] to see pt to walk. RN stated pt is still very nauseated and has received zofran. We will continue to follow. Graylon Good RN BSN 04/23/2017 1:44 PM

## 2017-04-23 NOTE — Care Management Important Message (Signed)
Important Message  Patient Details  Name: Jocelyn Hill. Knoll MRN: [id number] Date of Birth: Oct 10, 1950   Medicare Important Message Given:  Yes    Nathen May 04/23/2017, 9:48 AM

## 2017-04-23 NOTE — Progress Notes (Signed)
Report called to [place] at [facility].

## 2017-04-23 NOTE — Clinical Social Work Note (Signed)
Patient's daughter-in-law was wanting patient placed closer to [facility]. CSW discussed with patient and she was undecided because her daughter is in [place] but her son and mother are there. CSW spoke with patient and her daughter-in-law on speaker phone. Patient gave CSW permission to contact facilities to check on bed availability. CSW left voicemails for admissions coordinators at [facility] in [place] in [place]. Patient's later asked NT to have CSW come back. She decided to return to [facility]. Her son lives in [place] and is handicapped and he said it was too far for him to drive to [facility]. The patient also did not want to have all of her doctors changed. CSW called and notified patient's daughter-in-law. She is agreeable to this. Nurse secretary notified and she will continue scheduling appointments for patient to follow up while at [facility].  [name], [name]

## 2017-04-23 NOTE — Progress Notes (Signed)
Patients daughter in law Rise Paganini called concerned that family wanted patient moved to a facility closer to them in [facility].  Rise Paganini says all the family is in [place] and it takes them approximately 1 hour and 45 minutes to get here to [place].  I spoke with Ms. Kozuch about this who stated "that is what they want" but didn't actually express that she wanted to be moved closer to [facility].  Spoke to Judson Roch in Social Work regarding above.    Beverly's number is [phone].

## 2017-04-23 NOTE — Discharge Summary (Signed)
Physician Discharge Summary  Jocelyn Sauer. Hardgrave EHO:[id number] DOB: 1951-03-18 DOA: 04/12/2017  PCP: [name], MD  Admit date: 04/12/2017 Discharge date: 04/23/2017  Time spent: 35 minutes  Recommendations for Outpatient Follow-up:  HCAP? -questionable pneumonia complete 7 days of Zosyn   Acute respiratory failure with hypoxia -Diffuse pulmonary infiltrates most likely secondary to decompensated diastolic CHF with pulmonary edema. Resolved -Titrate O2 to maintain SPO2> 93%   Acute on Chronic Diastolic CHF -Strict in and out since admission -9.5 L -Daily weight Filed Weights   04/21/17 1434 04/22/17 0522 04/23/17 0431  Weight: 117 lb 4.8 oz (53.2 kg) 116 lb 12.8 oz (53 kg) 114 lb 1.6 oz (51.8 kg)  -Amlodipine 10 mg daily -ASA 81 mg daily -Coreg 6.25 mg BID -Doxazosin 2 mg daily -Lasix 160 mg BID -Hydralazine 100 mg TID -Imdur 120 mg daily -NTG SL PRN -transfuse for hemoglobin< 8 -Schedule Follow up with Dr. [name] in 2-3 weeks evaluate for cardiac catheterization   Acute Pulmonary edema -Continue aggressive diuresis    Paroxysmal atrial fibrillation  -Not on anticoagulation secondary to retroperitoneal bleed. -rate controled   CAD/status post MI (last admission) See CHF   Elevated troponin/Chest Pain -Slightly elevated troponin   -medication changes per cardiology recommendations   Rheumatic Severe Mitral Valve regurgitation  -at last admission cardio thoracic surgery stated patient currently not candidate for surgical treatment of severe rheumatic mitral valve regurgitation -Per CHF team note from last admission patient needs aggressive CHF management, followed by cardiac catheterization, as well as dental service consultation prior to surgery consideration.   HLD -Lipitor 40 mg daily  Right Hydronephrosis -Resolved with placement of right percutaneous nephrostomy drain.drain removed prior to discharge.   Acute on CKD stage IV(Upon discharge on  10/6 Cr= 3.18)      Recent Labs Lab 04/18/17 2251 04/19/17 0218 04/20/17 0242 04/21/17 0231 04/22/17 0508 04/23/17 0257  CREATININE 3.51* 2.83* 2.97* 3.14* 3.00* 2.98*  -with aggressive diuresis creatinine is better than at previous discharge. -Renal U/S with no hydronephrosis, LEFT renal atrophy, echogenic renal parenchyma (c/w renal disease) -Schedule follow-up in 1-2 weeks with Dr. Jeneen Rinks [facility] acute on CKD stage IV   Anemia of critical illness.   -No evidence of bleeding.  -Continue to hold anticoagulation secondary to retroperitoneal bleed -anemia panel consistent with anemia of chronic disease transfuse for Hgb <8.0 -Arnesep started per renal   Hypothyroidism -Synthroid   Diabetes type 2 Controlled with Renal Complication -3/70 Hemoglobin A1c= 6.2 -Resistant SSI   Anxiety - Continue BuSpar and Wellbutrin, as well as Cymbalta   Hypokalemia -Potassium goal> 4   Hypomagnesemia -Magnesium goal> 2   Poor dentation -per Teena Dunk DDS note 9/13   defered  dental extractions in the operating room with general anesthesia at last admission until cardiology deems  patient more medically stable.  -Cardiology, nephrology, and hospitalist service feels patient requires further rehabilitation prior to being stable enough for  teeth extraction under general anesthesia. -Schedule follow-up appointment with Teena Dunk DDS in 4-5 weeks evaluation for dental extraction under general anesthesia in preparation for mitral valve repair.   Goals of care -Appears discussion between cardiology, cardiothoracic surgery, and dental service as well as primary team decision is as follows patient to be discharged to SNF hopefully to gain enough strength for cardiac catheterization followed by major dental surgery to extract all teeth culminating in repair of mitral valve. NOTE given patient's current status not hopeful that she will be able to survive all  required steps.  Patient understands she is in a very tenuous position.       Discharge Diagnoses:  Active Problems:   Acute heart failure (HCC)   Acute pulmonary edema (HCC)   Protein-calorie malnutrition, severe   Discharge Condition: Guarded  Diet recommendation: Renal/carb modified fluid restriction 1281ml/day  Filed Weights   04/21/17 1434 04/22/17 0522 04/23/17 0431  Weight: 117 lb 4.8 oz (53.2 kg) 116 lb 12.8 oz (53 kg) 114 lb 1.6 oz (51.8 kg)    History of present illness:  66 year old BF resident of [facility] in [place] PMHx Depression, CVA, Chronic Diastolic CHF, Severe Mitral Valve disease(secondary to Rheumatic heart disease), CKD stage IV,Paroxysmal Atrial Fibrillation,HTN, HLD,  Diabetes mellitus without complication (diet-controlled) Hypothyroidism, Retroperitoneal Hematoma     She was discharged on 10/6. During this hospitalization she was diuresed over 26 L her weight went from 143 pounds on admission down to 123 she was deemed euvolemic upon time of discharge. Following discharge she was discharged to a skilled nursing facility with the plan to receive aggressive rehabilitation, and eventually be seen by dental medicine for dental extractions in hopes of eventually undergoing mitral valve surgery should she recover to the point thoracic surgery team for a potential candidate. Her diuretics were stopped upon time of admission to the nursing home, possibly 3 days prior to presentation at [facility] emergency rooms he began to notice increased shortness of breath, a cough with some blood in her sputum, increased weakness, increased swelling, and her lower extremities as well as increase in frequency of chest pain primarily substernal. In the emergency room she was found to have saturations in the 70s chest x-ray consistent with pulmonary edema per report 12-lead showing atrial fibrillation with prolonged QT as well as BNP of over 30,000. Given her prolonged recent hospital stay, and  multiple comorbidities as well as prior workup done here, on admission to our intensive care was requested.  During his hospitalization patient treated for acute on chronic diastolic CHF with aggressive diuresis, which improved her acute respiratory failure. In addition patient was again evaluated for repair of her severe mitral valve regurgitation. Unfortunately patient again found not stable to proceed with the workup secondary to acute on CKD stage IV, which prohibited cardiac catheterization. Poor overall deconditioning/health: Patient not felt stable for general anesthesia required to extract all of her teeth. After extensive evaluation, treatment, and workup by cardiology cardiothoracic surgery, nephrology and [facility] hospitalists felt that patient's best chance is continued aggressive medical management of her cardiac status, strengthening at SNF, with hopeful increase in overall general health and stamina allowing additional workup steps for mitral valve repair.      Procedures: 9/13 Admit 9/13 Echocardiogram: LVEF= 60-65%. -Grade 2 diastolic dysfunction.-Severe MR.-Mildly dilated LA and RA.-, mild to mod TR,. PAP 60 mm Hg. 9/18 TEE: EF 55-60%.-Severe restriction anterior/posterior leaflet MV w/ severe regurgitation  9/17 CXR: Bilateral pleural effusion.-Mild pulmonary interstitial edema.-Cardiomegaly  9/17 Brady/hypotensive/ intubated > transfer to ICU 9/20 patient to have pauses longest 6.7 seconds/bradycardia. All nodal blocking agents and NTG drips discontinued. 3 Amps Atropine administered.  9/25 Abdominal/pelvis CT:-RIGHT retroperitoneal hematoma extending into the right side of pelvis and right groin region. -Hematocrit level isidentified within the asymmetrically enlarged right psoas muscle hematoma. - Appendix is normal. -Gallbladder hydrops. Etiology and clinical significance indeterminate.  -Bilateral pleural effusions with diminished aeration to the lung bases. 9/25 RIGHT  Thoracentesis; 0.35 L of pleural fluid 9/27 PCXR: Continue left pleural effusion/LLL atelectasis improved 9/27 CT  abdomen pelvis WO contrast:-Stable RIGHT retroperitoneal hematoma.-Moderate RIGHT hydronephrosis most likely secondary to obstruction distal ureter secondary to hematoma -Dilated gallbladder without definite surrounding inflammation 9/28 RIGHT percutaneous nephrostomy tube   Consultations: Cardiology Nephrology [facility]    Cultures  9/25 RIGHT pleural fluid NGTD 9/27 urine positive Klebsiella pneumoniae/Escherichia coli 9/27 blood positive Escherichia coli 10/4 urine negative 10/4 blood negative     Antibiotics Anti-infectives    Start     Stop   04/14/17 0700  vancomycin (VANCOCIN) IVPB 750 mg/150 ml premix  Status:  Discontinued     04/14/17 0826   04/13/17 0500  piperacillin-tazobactam (ZOSYN) IVPB 2.25 g     04/17/17 2035   04/12/17 1930  vancomycin (VANCOCIN) IVPB 1000 mg/200 mL premix  Status:  Discontinued     04/12/17 1915   04/12/17 1930  piperacillin-tazobactam (ZOSYN) IVPB 3.375 g     04/12/17 2052       Discharge Exam: Vitals:   04/22/17 1200 04/22/17 2022 04/23/17 0431 04/23/17 1101  BP: 130/77 (!) 118/58 128/60 134/69  Pulse: 71 65 70 65  Resp: 18 20 18 16   Temp: 98.3 F (36.8 C) 97.6 F (36.4 C) 97.8 F (36.6 C) 98.3 F (36.8 C)  TempSrc: Oral Oral Oral Oral  SpO2: 97% 100% 100% 98%  Weight:   114 lb 1.6 oz (51.8 kg)   Height:        General: A/O 4, No acute respiratory distress, cachectic, weak Neck:  Negative scars, masses, torticollis, lymphadenopathy, JVD Lungs: Clear to auscultation bilaterally without wheezes or crackles Cardiovascular: Regular rate and rhythm without murmur gallop or rub normal S1 and S2   Discharge Instructions   Allergies as of 04/23/2017      Reactions   Ace Inhibitors Anaphylaxis, Swelling      Medication List    STOP taking these medications   amiodarone 100 MG  tablet Commonly known as:  PACERONE   amoxicillin-clavulanate 875-125 MG tablet Commonly known as:  AUGMENTIN   BASAGLAR KWIKPEN 100 UNIT/ML Sopn   gabapentin 300 MG capsule Commonly known as:  NEURONTIN   hydrOXYzine 50 MG tablet Commonly known as:  ATARAX/VISTARIL   insulin glargine 100 UNIT/ML injection Commonly known as:  LANTUS   levofloxacin 500 MG tablet Commonly known as:  LEVAQUIN   loperamide 2 MG tablet Commonly known as:  IMODIUM A-D   ondansetron 4 MG disintegrating tablet Commonly known as:  ZOFRAN-ODT   pregabalin 150 MG capsule Commonly known as:  LYRICA   PROBIOTIC PO   sertraline 25 MG tablet Commonly known as:  ZOLOFT   tiZANidine 4 MG tablet Commonly known as:  ZANAFLEX     TAKE these medications   acetaminophen 325 MG tablet Commonly known as:  TYLENOL Take 2 tablets (650 mg total) by mouth every 6 (six) hours as needed for mild pain, fever or headache. What changed:  medication strength  how much to take  when to take this  reasons to take this   albuterol (2.5 MG/3ML) 0.083% nebulizer solution Commonly known as:  PROVENTIL Take 2.5 mg by nebulization every 6 (six) hours as needed for shortness of breath.   amLODipine 10 MG tablet Commonly known as:  NORVASC Take 1 tablet (10 mg total) by mouth daily.   aspirin 81 MG EC tablet Take 1 tablet (81 mg total) by mouth daily.   atorvastatin 40 MG tablet Commonly known as:  LIPITOR Take 1 tablet (40 mg total) by mouth daily. What  changed:  when to take this   b complex vitamins tablet Take 1 tablet by mouth daily.   buPROPion 300 MG 24 hr tablet Commonly known as:  WELLBUTRIN XL Take 1 tablet (300 mg total) by mouth daily.   busPIRone 15 MG tablet Commonly known as:  BUSPAR Take 1 tablet (15 mg total) by mouth 2 (two) times daily.   carvedilol 6.25 MG tablet Commonly known as:  COREG Take 1 tablet (6.25 mg total) by mouth 2 (two) times daily with a meal.    cholecalciferol 1000 units tablet Commonly known as:  VITAMIN D Take 1,000 Units by mouth daily.   Darbepoetin Alfa 40 MCG/0.4ML Sosy injection Commonly known as:  ARANESP Inject 0.4 mLs (40 mcg total) into the skin every Tuesday at 6 PM.   docusate sodium 100 MG capsule Commonly known as:  COLACE Take 1 capsule (100 mg total) by mouth 2 (two) times daily.   doxazosin 2 MG tablet Commonly known as:  CARDURA Take 1 tablet (2 mg total) by mouth daily.   DULoxetine 30 MG capsule Commonly known as:  CYMBALTA Take 1 capsule (30 mg total) by mouth daily.   feeding supplement (NEPRO CARB STEADY) Liqd Take 237 mLs by mouth 2 (two) times daily between meals.   ferrous sulfate 325 (65 FE) MG tablet Take 1 tablet (325 mg total) by mouth 2 (two) times daily with a meal.   furosemide 80 MG tablet Commonly known as:  LASIX Take 2 tablets (160 mg total) by mouth 2 (two) times daily. What changed:  medication strength  how much to take  when to take this   hydrALAZINE 100 MG tablet Commonly known as:  APRESOLINE Take 1 tablet (100 mg total) by mouth every 8 (eight) hours. What changed:  medication strength  how much to take   insulin aspart 100 UNIT/ML injection Commonly known as:  novoLOG Inject 0-20 Units into the skin 3 (three) times daily with meals.   isosorbide mononitrate 120 MG 24 hr tablet Commonly known as:  IMDUR Take 1 tablet (120 mg total) by mouth daily.   levothyroxine 112 MCG tablet Commonly known as:  SYNTHROID, LEVOTHROID Take 1 tablet (112 mcg total) by mouth daily before breakfast.   lidocaine 5 % Commonly known as:  LIDODERM Place 1 patch onto the skin daily. Remove & Discard patch within 12 hours or as directed by MD   magnesium gluconate 500 MG tablet Commonly known as:  MAGONATE Take 0.5 tablets (250 mg total) by mouth daily.   montelukast 10 MG tablet Commonly known as:  SINGULAIR Take 1 tablet (10 mg total) by mouth daily.    nitroGLYCERIN 0.4 MG SL tablet Commonly known as:  NITROSTAT Place 1 tablet (0.4 mg total) under the tongue every 5 (five) minutes as needed for chest pain. What changed:  reasons to take this   ondansetron 4 MG tablet Commonly known as:  ZOFRAN Take 1 tablet (4 mg total) by mouth every 8 (eight) hours as needed for nausea or vomiting.   oxyCODONE 5 MG immediate release tablet Commonly known as:  Oxy IR/ROXICODONE Take 1 tablet (5 mg total) by mouth every 8 (eight) hours as needed (pain). What changed:  Another medication with the same name was removed. Continue taking this medication, and follow the directions you see here.   OXYGEN Inhale 2-4 L into the lungs as needed (for shortness of breath/ to keep oxygen levels above 93%).   pantoprazole 40 MG tablet Commonly  known as:  PROTONIX Take 1 tablet (40 mg total) by mouth 2 (two) times daily before a meal. What changed:  when to take this   polyethylene glycol packet Commonly known as:  MIRALAX / GLYCOLAX Take 17 g by mouth daily as needed (constipation).      Allergies  Allergen Reactions  . Ace Inhibitors Anaphylaxis and Swelling    Contact information for follow-up providers    Deterding, James, MD. Schedule an appointment as soon as possible for a visit in 2 week(s).   Specialty:  Nephrology Why:  Schedule follow-up in 1-2 weeks with Dr. Jeneen Rinks [facility] acute on CKD stage IV Contact information: [place] [address] [phone]        [name], DDS Follow up in 5 week(s).   Specialty:  Dentistry Why:  Schedule follow-up appointment with Teena Dunk DDS in 4-5 weeks evaluation for dental extraction under general anesthesia in preparation for mitral valve repair Contact information: [place] [address] [phone]        Larey Dresser, MD Follow up.   Specialty:  Cardiology Contact information: [place]  [id number] [phone]            Contact information for after-discharge care    Destination    [facility] Follow up.   Specialty:  [place] information: [address] [place] [address] [phone]                   The results of significant diagnostics from this hospitalization (including imaging, microbiology, ancillary and laboratory) are listed below for reference.    Significant Diagnostic Studies: Ct Abdomen Pelvis Wo Contrast  Result Date: 03/26/2017 CLINICAL DATA:  Retroperitoneal hematoma. EXAM: CT ABDOMEN AND PELVIS WITHOUT CONTRAST TECHNIQUE: Multidetector CT imaging of the abdomen and pelvis was performed following the standard protocol without IV contrast. COMPARISON:  CT scan of March 24, 2017. FINDINGS: Lower chest: Mild left posterior basilar atelectasis or infiltrate is noted. Hepatobiliary: Dilated gallbladder is again noted and unchanged. No focal abnormality is seen in the liver on these unenhanced images. Pancreas: Unremarkable. No pancreatic ductal dilatation or surrounding inflammatory changes. Spleen: Normal in size without focal abnormality. Adrenals/Urinary Tract: Adrenal glands appear normal. Severe left renal atrophy is noted. Moderate right hydronephrosis with perinephric stranding is noted, which appears to be due to retroperitoneal hematoma described on prior exam. Foley catheter is present within the urinary bladder. No renal or ureteral calculi are noted. Stomach/Bowel: Stomach is within normal limits. Appendix appears normal. No evidence of bowel wall thickening, distention, or inflammatory changes. Vascular/Lymphatic: Aortic atherosclerosis. No enlarged abdominal or pelvic lymph nodes. Reproductive: Status post hysterectomy. No adnexal masses. Other: Stable right retroperitoneal hematoma is noted which extends along the right psoas muscle and into the right pelvis and slightly into the  right inguinal region. Musculoskeletal: No acute or significant osseous findings. IMPRESSION: Stable right retroperitoneal hematoma is noted which extends along the right psoas muscle into the right pelvis and slightly in the right inguinal region. There is now noted moderate right hydronephrosis, most likely due to obstruction of distal ureter secondary to hematoma. Severe left renal atrophy is noted. Aortic atherosclerosis. Dilated gallbladder is noted without definite surrounding inflammation. Electronically Signed   By: Marijo Conception, M.D.   On: 03/26/2017 15:40   Ct Abdomen Pelvis Wo Contrast  Result Date: 03/24/2017 CLINICAL DATA:  Abdominal  pain.  Evaluate for appendicitis EXAM: CT ABDOMEN AND PELVIS WITHOUT CONTRAST TECHNIQUE: Multidetector CT imaging of the abdomen and pelvis was performed following the standard protocol without IV contrast. COMPARISON:  None. FINDINGS: Lower chest: There are bilateral pleural effusions noted left greater than right. The airspace densities within the left lower lobe and lingula noted. There is compressive type atelectasis in the right base. Hepatobiliary: No focal liver abnormality. Gallbladder appears distended. No biliary dilatation. No gallstones identified. Pancreas: Within the limitations of unenhanced technique the pancreas appears unremarkable. Spleen: Normal in size without focal abnormality. Adrenals/Urinary Tract: The right kidney appears normal. Asymmetric atrophy of the left kidney. Normal adrenal glands. The urinary bladder is partially collapsed around a Foley catheter balloon. Stomach/Bowel: The stomach appears normal. The small bowel loops are normal in course and caliber. Normal appearance of the colon. There is a small caliber contrast filled structure within the right lower quadrant which is favored to represent a normal appendix, image 73 of series 3. No pathologic dilatation of the colon. Vascular/Lymphatic: Aortic atherosclerosis. No aneurysm. No  adenopathy within the abdomen or pelvis. Reproductive: Uterus is not well visualized and may be atrophic or surgically absent. Other: There is asymmetric, high attenuation enlargement of the right ileo psoas muscle extending into the right pelvic sidewall and right inguinal region compatible with retroperitoneal and extraperitoneal hematoma of the abdomen and pelvis. Hematocrit arrival is identified within the right psoas muscle hematoma, image number 74 of series 3. Hematoma including the psoas muscle measures 6.5 x 5.2 x 14.8 cm. Musculoskeletal: Mild degenerative disc disease identified within the lumbar spine. IMPRESSION: 1. A right retroperitoneal hematoma is suspected extending into the right side of pelvis and right groin region. A hematocrit level is identified within the asymmetrically enlarged right psoas muscle hematoma. 2. The appendix is normal. 3.  Aortic Atherosclerosis (ICD10-I70.0). 4. Gallbladder hydrops. Etiology and clinical significance indeterminate. This could be better assessed with right upper quadrant sonogram. 5. Bilateral pleural effusions with diminished aeration to the lung bases. 6. Critical Value/emergent results were called by telephone at the time of interpretation on 03/24/2017 at 5:59 pm to Dr. Dia Crawford , who verbally acknowledged these results. Electronically Signed   By: Kerby Moors M.D.   On: 03/24/2017 17:59   Dg Chest 1 View  Result Date: 03/24/2017 CLINICAL DATA:  Post thoracentesis EXAM: CHEST 1 VIEW COMPARISON:  03/23/2017, 03/22/2017 FINDINGS: Right-sided catheter sheath remains in place with kink at the supraclavicular region. The tip projects over the venous confluence. Decreased right-sided pleural effusion with improved aeration of the right lung base. Persistent small left effusion with dense consolidation at the left lung base. Hazy atelectasis or infiltrate at the right lung base. No pneumothorax is seen. Stable cardiomegaly. IMPRESSION: 1. Decreased  right pleural effusion post thoracentesis. No definitive pneumothorax seen 2. Continued left pleural effusion with dense left lower lobe atelectasis or pneumonia. Hazy atelectasis or infiltrate at the right base 3. Cardiomegaly unchanged Electronically Signed   By: Donavan Foil M.D.   On: 03/24/2017 16:29   US Renal  Result Date: 04/14/2017 CLINICAL DATA:  Acute on chronic kidney injury, recurrent hydronephrosis EXAM: RENAL / URINARY TRACT ULTRASOUND COMPLETE COMPARISON:  CT abdomen/ pelvis dated 03/26/2017 FINDINGS: Right Kidney: Length: 12.4 cm. Echogenic renal parenchyma, suggesting medical renal disease. No hydronephrosis. Left Kidney: Length: 6.4 cm. Echogenic renal parenchyma, suggesting medical renal disease. No hydronephrosis. Bladder: Bladder decompressed by indwelling Foley catheter. IMPRESSION: No hydronephrosis. Left renal atrophy. Echogenic renal parenchyma, suggesting medical  renal disease. Electronically Signed   By: Julian Hy M.D.   On: 04/14/2017 11:24   Ir Nephro Tube Remov/fl  Result Date: 04/02/2017 INDICATION: Recent right nephrostogram for right hydronephrosis secondary to right retroperitoneal hematoma EXAM: ANTEGRADE NEPHROSTOGRAM THROUGH EXISTING NEPHROSTOMY MEDICATIONS: NONE. ANESTHESIA/SEDATION: NONE. COMPLICATIONS: None immediate. PROCEDURE: Under sterile conditions, the existing right nephrostomy was injected with contrast for an antegrade nephrostogram. Right nephrostogram: Catheter has retracted. Extravasation noted at the retroperitoneal access site. Contrast does opacify the collapsed collecting system without hydronephrosis. Contrast opacifies the ureter and eventually passes distally into the bladder without obstruction. There is deviation of the ureter medially related to the retroperitoneal hematoma. Nephrostomy catheter removed.  No immediate complication. IMPRESSION: Patent right collecting system and ureter without obstruction or hydronephrosis. Right  nephrostomy removed. Electronically Signed   By: Jerilynn Mages.  Shick M.D.   On: 04/02/2017 12:36   Dg Chest Port 1 View  Result Date: 04/19/2017 CLINICAL DATA:  Chest pain tonight EXAM: PORTABLE CHEST 1 VIEW COMPARISON:  04/17/2017 FINDINGS: Cardiac enlargement with vascular congestion. Perihilar infiltrates likely represent edema. Can't exclude superimposed pneumonia in the upper lobes. Infiltrates are less prominent than on previous study suggesting some interval clearing. No blunting of costophrenic angles. No pneumothorax. Calcification of the aorta. Mediastinal contours appear intact. IMPRESSION: Cardiac enlargement with pulmonary vascular congestion and perihilar edema and/or pneumonia, with some improvement of perihilar infiltrates since previous study. Aortic atherosclerosis. Electronically Signed   By: Lucienne Capers M.D.   On: 04/19/2017 05:37   Dg Chest Port 1 View  Result Date: 04/17/2017 CLINICAL DATA:  Pneumonia. EXAM: PORTABLE CHEST 1 VIEW COMPARISON:  04/17/2015. FINDINGS: Severe cardiomegaly again noted. Persistent bilateral bilateral pulmonary infiltrates/edema most prominently upper lobes again noted without interim change. Small left pleural effusion. No pneumothorax . IMPRESSION: Severe cardiomegaly. Persistent bilateral pulmonary infiltrates/edema most prominent in the upper lobes again noted. No interim change. Small left pleural effusion . Electronically Signed   By: Marcello Moores  Register   On: 04/17/2017 06:41   Dg Chest Port 1 View  Result Date: 04/16/2017 CLINICAL DATA:  Shortness of Breath EXAM: PORTABLE CHEST 1 VIEW COMPARISON:  April 15, 2017 and April 12, 2017 FINDINGS: There is extensive airspace opacity in both upper lobes, stable on the left and increased on the right. There is more patchy interstitial and alveolar opacity in the mid lung regions. There is patchy consolidation in the medial left base, stable. There is cardiomegaly. There is mild pulmonary venous hypertension.  There is aortic atherosclerosis. No pneumothorax. No bone lesions. IMPRESSION: Extensive upper lobe predominant airspace opacity, stable on the left and increased on the right. Suspect progression of pneumonia, although there may well be pulmonary edema present. Both entities may exist concurrently. Stable cardiac enlargement. Stable consolidation left lower lobe, felt to represent a degree of pneumonia with likely atelectasis as well. There is aortic atherosclerosis. Aortic Atherosclerosis (ICD10-I70.0). Electronically Signed   By: Lowella Grip III M.D.   On: 04/16/2017 07:36   Dg Chest Port 1 View  Result Date: 04/15/2017 CLINICAL DATA:  66 y/o  F; cough with history of intubation. EXAM: PORTABLE CHEST 1 VIEW COMPARISON:  04/14/2017 chest radiograph. FINDINGS: Stable cardiomegaly. Stable diffuse patchy opacities of the lungs. Stable blunting of costal diaphragmatic angles compatible with trace effusions. Aortic atherosclerosis with calcification. No acute osseous abnormality is evident. IMPRESSION: Stable cardiomegaly. Stable patchy airspace opacities and small effusions probably representing pulmonary edema. Underlying pneumonia not excluded. Electronically Signed   By: Edgardo Roys.D.  On: 04/15/2017 04:36   Dg Chest Port 1 View  Result Date: 04/14/2017 CLINICAL DATA:  Short of breath EXAM: PORTABLE CHEST 1 VIEW COMPARISON:  Portable chest x-ray of 04/12/2017 FINDINGS: There is little change to slight worsening of interstitial and alveolar opacities most consistent with slight worsening of edema. Superimposed pneumonia cannot be excluded. Small pleural effusions may be present. Moderate cardiomegaly is stable. No acute bony abnormality is seen. IMPRESSION: Slight worsening of interstitial and alveolar opacities most consistent with edema. Pneumonia cannot be excluded. Electronically Signed   By: Ivar Drape M.D.   On: 04/14/2017 09:23   Dg Chest Port 1 View  Result Date:  04/12/2017 CLINICAL DATA:  Pneumonia versus pulmonary edema EXAM: PORTABLE CHEST 1 VIEW COMPARISON:  04/12/2017 FINDINGS: Enlarged cardiac silhouette. Diffuse fine airspace disease is similar comparison exam. Airspace disease slightly less dense. Lungs are hyperinflated. No pleural fluid. IMPRESSION: Mild improvement in diffuse interstitial and fine pulmonary edema pattern. Stable cardiomegaly. Electronically Signed   By: Suzy Bouchard M.D.   On: 04/12/2017 18:57   Dg Chest Port 1 View  Result Date: 03/29/2017 CLINICAL DATA:  Pt has not had a BM since 9/21, c/o low back pain, recent nephrostomy tube placement, and recent retroperitoneal bleed. Pt has been groaning in pain for hours. EXAM: PORTABLE CHEST 1 VIEW COMPARISON:  03/26/2017 FINDINGS: Right IJV catheter is in place, crimped in the region of the neck. The heart is enlarged. There are hazy airspace filling opacities bilaterally, compatible with edema or infectious process. No pleural effusions. IMPRESSION: Cardiomegaly.  Suspect increased edema or infection. Electronically Signed   By: Nolon Nations M.D.   On: 03/29/2017 07:20   Dg Chest Port 1 View  Result Date: 03/26/2017 CLINICAL DATA:  Pneumonia EXAM: PORTABLE CHEST 1 VIEW COMPARISON:  03/24/2017 FINDINGS: Cardiomegaly. Right lung is clear. Continued left lower lobe airspace opacity and probable small left effusion. Aeration is slightly improved in the left base. Right internal jugular vascular sheath again noted, unchanged. IMPRESSION: Continued left pleural effusion with left lower lobe atelectasis or infiltrate, both improved since prior study. No confluent opacity on the right. Stable cardiomegaly. Electronically Signed   By: Rolm Baptise M.D.   On: 03/26/2017 10:37   Dg Abd Portable 1v  Result Date: 03/29/2017 CLINICAL DATA:  Pt has not had a BM since 9/21, c/o low back pain, recent nephrostomy tube placement, and recent retroperitoneal bleed. Pt has been groaning in pain for  hours. EXAM: PORTABLE ABDOMEN - 1 VIEW COMPARISON:  03/17/2017 FINDINGS: Right-sided percutaneous nephrostomy has been placed since the prior study. There is contrast within nondilated loops of colon. Average stool burden. No evidence for free intraperitoneal air or bowel obstruction. Scattered phleboliths are identified within the pelvis. IMPRESSION: Nonobstructive bowel gas pattern. Retained contrast, presumably follow up CT of the abdomen and pelvis on 03/26/2017. Electronically Signed   By: Nolon Nations M.D.   On: 03/29/2017 07:18   Ir Nephrostogram Right Thru Existing Access  Result Date: 04/02/2017 INDICATION: Recent right nephrostogram for right hydronephrosis secondary to right retroperitoneal hematoma EXAM: ANTEGRADE NEPHROSTOGRAM THROUGH EXISTING NEPHROSTOMY MEDICATIONS: NONE. ANESTHESIA/SEDATION: NONE. COMPLICATIONS: None immediate. PROCEDURE: Under sterile conditions, the existing right nephrostomy was injected with contrast for an antegrade nephrostogram. Right nephrostogram: Catheter has retracted. Extravasation noted at the retroperitoneal access site. Contrast does opacify the collapsed collecting system without hydronephrosis. Contrast opacifies the ureter and eventually passes distally into the bladder without obstruction. There is deviation of the ureter medially related to the retroperitoneal  hematoma. Nephrostomy catheter removed.  No immediate complication. IMPRESSION: Patent right collecting system and ureter without obstruction or hydronephrosis. Right nephrostomy removed. Electronically Signed   By: Jerilynn Mages.  Shick M.D.   On: 04/02/2017 12:36   Ir Nephrostomy Placement Right  Result Date: 03/27/2017 INDICATION: Right-sided hydronephrosis secondary to ureteral obstruction. EXAM: IR NEPHROSTOMY PLACEMENT RIGHT COMPARISON:  CT of the abdomen on 03/26/2017 MEDICATIONS: Ciprofloxacin 400 mg IV; The antibiotic was administered in an appropriate time frame prior to skin puncture.  ANESTHESIA/SEDATION: Fentanyl 100 mcg IV; Versed 4.0 mg IV Moderate Sedation Time:  10 minutes. The patient was continuously monitored during the procedure by the interventional radiology nurse under my direct supervision. CONTRAST:  10 mL Isovue-300 - administered into the collecting system(s) FLUOROSCOPY TIME:  Fluoroscopy Time: 1 minutes and 30 seconds (4.1 mGy). COMPLICATIONS: None immediate. PROCEDURE: Informed written consent was obtained from the patient after a thorough discussion of the procedural risks, benefits and alternatives. All questions were addressed. Maximal Sterile Barrier Technique was utilized including caps, mask, sterile gowns, sterile gloves, sterile drape, hand hygiene and skin antiseptic. A timeout was performed prior to the initiation of the procedure. Ultrasound was used to localize the right kidney. Under direct ultrasound guidance, a 21 gauge needle was advanced into the collecting system. After aspiration of urine, contrast was injected. A guidewire was advanced into the collecting system. A transitional dilator was placed over the wire. The percutaneous tract was dilated over the wire and a 10 French percutaneous nephrostomy tube advanced. This was formed in the collecting system and a fluoroscopic spot image obtained after injection of contrast material. The catheter was flushed and connected to a gravity drainage bag. It was secured at the skin with a Prolene retention suture and StatLock device. FINDINGS: By ultrasound and also later with contrast injection, hydronephrosis of the right kidney does appear to be improved. However, given renal failure progressive hyperkalemia, there was felt to be indication to proceed with nephrostomy tube placement. The tube was placed via interpolar access with the catheter formed in the renal pelvis. IMPRESSION: Right-sided percutaneous nephrostomy tube placement with placement of a 10 French catheter formed at the level of the renal pelvis. The  catheter was attached to gravity bag drainage. Electronically Signed   By: Aletta Edouard M.D.   On: 03/27/2017 16:21   Ir Thoracentesis Asp Pleural Space W/img Guide  Result Date: 03/24/2017 INDICATION: History of right-sided pleural effusion noted on x-ray. Request is made for diagnostic and therapeutic thoracentesis. EXAM: ULTRASOUND GUIDED DIAGNOSTIC AND THERAPEUTIC THORACENTESIS MEDICATIONS: 1% lidocaine COMPLICATIONS: None immediate. PROCEDURE: An ultrasound guided thoracentesis was thoroughly discussed with the patient and questions answered. The benefits, risks, alternatives and complications were also discussed. The patient understands and wishes to proceed with the procedure. Written consent was obtained. Ultrasound was performed to localize and mark an adequate pocket of fluid in the right chest. The area was then prepped and draped in the normal sterile fashion. 1% Lidocaine was used for local anesthesia. Under ultrasound guidance a Safe-T-Centesis catheter was introduced. Thoracentesis was performed. The catheter was removed and a dressing applied. FINDINGS: A total of approximately 0.35 L of serous fluid was removed. Samples were sent to the laboratory as requested by the clinical team. IMPRESSION: Successful ultrasound guided right thoracentesis yielding 0.35 L of pleural fluid. Read by: Saverio Danker, PA-C Electronically Signed   By: Aletta Edouard M.D.   On: 03/24/2017 16:38    Microbiology: No results found for this or any previous visit (  from the past 240 hour(s)).   Labs: Basic Metabolic Panel:  Recent Labs Lab 04/18/17 0357  04/19/17 0218 04/20/17 0242 04/21/17 0231 04/22/17 0508 04/23/17 0257  NA 134*  < > 135 132* 133* 133* 132*  K 3.1*  < > 4.4 3.6 3.7 4.2 3.4*  CL 92*  < > 93* 92* 90* 89* 90*  CO2 30  < > 29 32 30 31 31   GLUCOSE 89  < > 84 128* 125* 138* 110*  BUN 32*  < > 28* 29* 28* 30* 30*  CREATININE 3.04*  < > 2.83* 2.97* 3.14* 3.00* 2.98*  CALCIUM 9.3  < >  9.6 9.3 9.7  9.7 9.4 9.1  MG 1.6*  --  1.5* 2.1 1.9 1.9  --   PHOS 2.5  --  2.7 3.1 3.4 4.0 4.5  < > = values in this interval not displayed. Liver Function Tests:  Recent Labs Lab 04/19/17 0218 04/20/17 0242 04/21/17 0231 04/22/17 0508 04/23/17 0257  ALBUMIN 2.3* 2.3* 2.7* 2.7* 2.7*   No results for input(s): LIPASE, AMYLASE in the last 168 hours. No results for input(s): AMMONIA in the last 168 hours. CBC:  Recent Labs Lab 04/17/17 0412 04/18/17 2251 04/21/17 0231 04/23/17 0257  WBC 7.5 7.8 6.7 6.1  NEUTROABS  --  6.1  --   --   HGB 9.0* 10.7* 12.1 11.3*  HCT 27.1* 32.3* 36.6 33.6*  MCV 78.3 80.0 80.4 79.6  PLT 265 301 368 296   Cardiac Enzymes:  Recent Labs Lab 04/18/17 2251 04/19/17 0527 04/23/17 1139  TROPONINI 0.12* 0.14* 0.07*   BNP: BNP (last 3 results)  Recent Labs  03/12/17 1400  BNP 2,230.5*    ProBNP (last 3 results) No results for input(s): PROBNP in the last 8760 hours.  CBG:  Recent Labs Lab 04/22/17 1145 04/22/17 1719 04/22/17 2142 04/23/17 0735 04/23/17 1204  GLUCAP 177* 137* 144* 109* 177*       Signed:  Dia Crawford, MD [facility] [phone] pager

## 2017-04-23 NOTE — NC FL2 (Signed)
[place] LEVEL OF CARE SCREENING TOOL     IDENTIFICATION  Patient Name: Jocelyn Hill Birthdate: 14-Dec-1950 Sex: female Admission Date (Current Location): 04/12/2017  [facility] and [place] Number:  [employer] and Address:  [facility], [place] [address] [place], [place] [address]      Provider Number: [id number]  Attending Physician Name and Address:  Allie Bossier, MD  Relative Name and Phone Number:       Current Level of Care: Hospital Recommended Level of Care: [place] Prior Approval Number:    Date Approved/Denied:   PASRR Number: [id number]  Discharge Plan: SNF    Current Diagnoses: Patient Active Problem List   Diagnosis Date Noted  . Protein-calorie malnutrition, severe 04/21/2017  . Acute pulmonary edema (HCC)   . Acute heart failure ([place]) 04/12/2017  . Substance abuse ([place])   . Acute on chronic diastolic CHF (congestive heart failure) ([place])   . Pulmonary hypertension ([place])   . Asystole ([place])   . Non-rheumatic mitral regurgitation   . Acute renal failure superimposed on stage 3 chronic kidney disease ([place])   . Anemia due to chronic kidney disease   . Controlled diabetes mellitus type 2 with complications ([place])   . Acute encephalopathy   . Severe mitral regurgitation   . Stage 4 chronic kidney disease ([place])   . 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 Microcytic anemia 03/13/2017  . Hyperlipidemia 03/12/2017  . Hypertension 03/12/2017  . Hypothyroidism 03/12/2017  . Diet-controlled diabetes mellitus ([place]) 03/12/2017  . Acute diastolic CHF (congestive heart failure) ([place]) 03/12/2017  . AKI (acute kidney injury) ([place]) 03/12/2017  . Depression 03/12/2017  . CKD (chronic kidney disease), stage III ([place]) 03/12/2017  . CHF (congestive heart failure) ([place]) 03/12/2017  . Dysphagia 03/12/2017  . Acute respiratory failure with hypoxia ([place]) 03/12/2017    Orientation RESPIRATION BLADDER Height &  Weight     Self, Time, Situation, Place  Normal Continent Weight: 114 lb 1.6 oz (51.8 kg) Height:  5\' 9"  (175.3 cm)  BEHAVIORAL SYMPTOMS/MOOD NEUROLOGICAL BOWEL NUTRITION STATUS   (None)  (None) Continent Diet (Renal/carb modified with fluid restriction: 1200 mL.)  AMBULATORY STATUS COMMUNICATION OF NEEDS Skin   Limited Assist Verbally Bruising                       Personal Care Assistance Level of Assistance  Bathing, Feeding, Dressing Bathing Assistance: Limited assistance Feeding assistance: Limited assistance Dressing Assistance: Limited assistance     Functional Limitations Info  Sight, Hearing, Speech Sight Info: Adequate Hearing Info: Adequate Speech Info: Adequate    SPECIAL CARE FACTORS FREQUENCY  PT (By licensed PT), OT (By licensed OT)     PT Frequency: 5 x week OT Frequency: 5 x week            Contractures Contractures Info: Not present    Additional Factors Info  Code Status, Allergies, Psychotropic Code Status Info: Full Allergies Info: Ace Inhibitors Psychotropic Info: Depression: Wellbutrin XL 300 mg PO daily, Buspar 15 mg PO BID         Current Medications (04/23/2017):  This is the current hospital active medication list Current Facility-Administered Medications  Medication Dose Route Frequency Provider Last Rate Last Dose  . acetaminophen (TYLENOL) tablet 650 mg  650 mg Oral Q6H PRN Cherene Altes, MD      . acetaminophen-codeine (TYLENOL #3) 300-30 MG per tablet 1 tablet  1 tablet Oral  Q6H PRN Rigoberto Noel, MD   1 tablet at 04/23/17 1227  . amLODipine (NORVASC) tablet 10 mg  10 mg Oral Daily Ledell Noss, MD   10 mg at 04/23/17 1117  . aspirin EC tablet 81 mg  81 mg Oral Daily Erick Colace, NP   81 mg at 04/23/17 1117  . atorvastatin (LIPITOR) tablet 40 mg  40 mg Oral Daily Erick Colace, NP   40 mg at 04/23/17 1118  . bisacodyl (DULCOLAX) suppository 10 mg  10 mg Rectal Daily PRN Anders Simmonds, MD      . buPROPion  (WELLBUTRIN XL) 24 hr tablet 300 mg  300 mg Oral Daily Erick Colace, NP   300 mg at 04/23/17 1118  . busPIRone (BUSPAR) tablet 15 mg  15 mg Oral BID Erick Colace, NP   15 mg at 04/23/17 1119  . carvedilol (COREG) tablet 6.25 mg  6.25 mg Oral BID WC Shirley Friar, PA-C   6.25 mg at 04/23/17 0754  . cholecalciferol (VITAMIN D) tablet 1,000 Units  1,000 Units Oral Daily Erick Colace, NP   1,000 Units at 04/23/17 1119  . Darbepoetin Alfa (ARANESP) injection 40 mcg  40 mcg Subcutaneous Q Tue-1800 Donato Heinz, MD   40 mcg at 04/21/17 2116  . docusate sodium (COLACE) capsule 100 mg  100 mg Oral BID Anders Simmonds, MD   100 mg at 04/22/17 2132  . doxazosin (CARDURA) tablet 2 mg  2 mg Oral Daily Larey Dresser, MD   2 mg at 04/23/17 1117  . DULoxetine (CYMBALTA) DR capsule 30 mg  30 mg Oral Daily Erick Colace, NP   30 mg at 04/23/17 1120  . feeding supplement (NEPRO CARB STEADY) liquid 237 mL  237 mL Oral BID BM Cherene Altes, MD   237 mL at 04/22/17 1429  . ferrous sulfate tablet 325 mg  325 mg Oral BID WC Erick Colace, NP   325 mg at 04/23/17 0754  . furosemide (LASIX) tablet 160 mg  160 mg Oral BID Lorella Nimrod, MD   160 mg at 04/23/17 0754  . gi cocktail (Maalox,Lidocaine,Donnatal)  30 mL Oral TID PRN Cherene Altes, MD      . hydrALAZINE (APRESOLINE) tablet 100 mg  100 mg Oral Q8H Clegg, Amy D, NP   100 mg at 04/23/17 0611  . insulin aspart (novoLOG) injection 0-20 Units  0-20 Units Subcutaneous TID WC Lovenia Kim, MD   3 Units at 04/22/17 1735  . insulin aspart (novoLOG) injection 0-5 Units  0-5 Units Subcutaneous QHS Erick Colace, NP   2 Units at 04/13/17 0035  . isosorbide mononitrate (IMDUR) 24 hr tablet 120 mg  120 mg Oral Daily Skeet Latch, MD   120 mg at 04/23/17 1120  . levothyroxine (SYNTHROID, LEVOTHROID) tablet 112 mcg  112 mcg Oral QAC breakfast Allie Bossier, MD   112 mcg at 04/23/17 [phone]  . lidocaine (LIDODERM) 5 % 1 patch  1  patch Transdermal Q24H Buford Dresser, MD   1 patch at 04/23/17 [phone]  . magnesium gluconate (MAGONATE) tablet 250 mg  250 mg Oral Daily Satira Sark, MD   250 mg at 04/23/17 1121  . montelukast (SINGULAIR) tablet 10 mg  10 mg Oral Daily Erick Colace, NP   10 mg at 04/23/17 1119  . morphine 2 MG/ML injection 2 mg  2 mg Intravenous Q1H PRN Cherene Altes, MD   2  mg at 04/23/17 0752  . nitroGLYCERIN (NITROSTAT) SL tablet 0.4 mg  0.4 mg Sublingual Q5 min PRN Anders Simmonds, MD   0.4 mg at 04/18/17 2100  . ondansetron (ZOFRAN) injection 4 mg  4 mg Intravenous Q6H PRN Anders Simmonds, MD   4 mg at 04/23/17 3335  . pantoprazole (PROTONIX) EC tablet 40 mg  40 mg Oral BID AC Cherene Altes, MD   40 mg at 04/23/17 4562     Discharge Medications: Please see discharge summary for a list of discharge medications.  Relevant Imaging Results:  Relevant Lab Results:   Additional Information SS#: [id number]  Candie Chroman, LCSW

## 2017-04-23 NOTE — Progress Notes (Signed)
Pt without BM for 4 days. Pt getting Colace, but refused Dulcolax suppository last night. Will continue to monitor.

## 2017-04-23 NOTE — Progress Notes (Signed)
Attempted to call report to [facility], transferred to a voice mail.  Will attempt again.

## 2017-04-23 NOTE — Clinical Social Work Note (Signed)
CSW facilitated patient discharge including contacting patient family Shaquaya Wuellner) and facility to confirm patient discharge plans. Clinical information faxed to facility and family agreeable with plan. CSW arranged ambulance transport via [facility] to [facility]. RN to call report prior to discharge [phone]).  CSW will sign off for now as social work intervention is no longer needed. Please consult [place] again if new needs arise.  [name], [place]

## 2017-04-23 NOTE — Progress Notes (Signed)
Patient ID: Jocelyn Hill. Zhao, female   DOB: July 17, 1950, 66 y.o.   MRN: [id number] Patient I    Advanced Heart Failure Rounding Note  HF Cardiology: Dr. Aundra Dubin   Subjective:    She remains off oxygen.  No dyspnea at rest.  Walked 18 feet with PT yesterday.   She has had several days of fairly constant pain in her central chest, seems reproducible with palpation.   Good UOP with po Lasix.  Weight trending slowly down.    Creatinine 2.8 -> 2.9 -> 3.14 -> 3.0 -> 2.98.   Objective:   Weight Range: 114 lb 1.6 oz (51.8 kg) Body mass index is 16.85 kg/m.   Vital Signs:   Temp:  [97.6 F (36.4 C)-98.3 F (36.8 C)] 97.8 F (36.6 C) (10/25 0431) Pulse Rate:  [65-71] 70 (10/25 0431) Resp:  [18-20] 18 (10/25 0431) BP: (118-130)/(58-77) 128/60 (10/25 0431) SpO2:  [97 %-100 %] 100 % (10/25 0431) Weight:  [114 lb 1.6 oz (51.8 kg)] 114 lb 1.6 oz (51.8 kg) (10/25 0431) Last BM Date: 04/19/17  Weight change: Filed Weights   04/21/17 1434 04/22/17 0522 04/23/17 0431  Weight: 117 lb 4.8 oz (53.2 kg) 116 lb 12.8 oz (53 kg) 114 lb 1.6 oz (51.8 kg)    Intake/Output:   Intake/Output Summary (Last 24 hours) at 04/23/17 1046 Last data filed at 04/23/17 0900  Gross per 24 hour  Intake              720 ml  Output             1900 ml  Net            -1180 ml      Physical Exam    General: NAD, frail.  Neck: No JVD, no thyromegaly or thyroid nodule.  Lungs: Clear to auscultation bilaterally with normal respiratory effort. CV: Nondisplaced PMI.  Heart regular S1/S2, no S3/S4, 1/6 HSM apex.  No peripheral edema.   Abdomen: Soft, nontender, no hepatosplenomegaly, no distention.  Skin: Intact without lesions or rashes.  Neurologic: Alert and oriented x 3.  Psych: Normal affect. Extremities: No clubbing or cyanosis.  HEENT: Normal.  MSK: Tender to palpation central chest wall.   Telemetry   NSR in 70s, personally reviewed.   EKG    Junctional Rhythm 59 bpm on admit   Labs     CBC  Recent Labs  04/21/17 0231 04/23/17 0257  WBC 6.7 6.1  HGB 12.1 11.3*  HCT 36.6 33.6*  MCV 80.4 79.6  PLT 368 287   Basic Metabolic Panel  Recent Labs  04/21/17 0231 04/22/17 0508 04/23/17 0257  NA 133* 133* 132*  K 3.7 4.2 3.4*  CL 90* 89* 90*  CO2 30 31 31   GLUCOSE 125* 138* 110*  BUN 28* 30* 30*  CREATININE 3.14* 3.00* 2.98*  CALCIUM 9.7  9.7 9.4 9.1  MG 1.9 1.9  --   PHOS 3.4 4.0 4.5   Liver Function Tests  Recent Labs  04/22/17 0508 04/23/17 0257  ALBUMIN 2.7* 2.7*   No results for input(s): LIPASE, AMYLASE in the last 72 hours. Cardiac Enzymes No results for input(s): CKTOTAL, CKMB, CKMBINDEX, TROPONINI in the last 72 hours.  BNP: BNP (last 3 results)  Recent Labs  03/12/17 1400  BNP 2,230.5*    ProBNP (last 3 results) No results for input(s): PROBNP in the last 8760 hours.   D-Dimer No results for input(s): DDIMER in the last 72 hours. Hemoglobin A1C  No results for input(s): HGBA1C in the last 72 hours. Fasting Lipid Panel  Recent Labs  04/22/17 0508  CHOL 119  HDL 45  LDLCALC 48  TRIG 130  CHOLHDL 2.6   Thyroid Function Tests No results for input(s): TSH, T4TOTAL, T3FREE, THYROIDAB in the last 72 hours.  Invalid input(s): FREET3  Other results:  Imaging   No results found.  Medications:     Scheduled Medications: . amLODipine  10 mg Oral Daily  . aspirin EC  81 mg Oral Daily  . atorvastatin  40 mg Oral Daily  . buPROPion  300 mg Oral Daily  . busPIRone  15 mg Oral BID  . carvedilol  6.25 mg Oral BID WC  . cholecalciferol  1,000 Units Oral Daily  . darbepoetin (ARANESP) injection - NON-DIALYSIS  40 mcg Subcutaneous Q Tue-1800  . docusate sodium  100 mg Oral BID  . doxazosin  2 mg Oral Daily  . DULoxetine  30 mg Oral Daily  . feeding supplement (NEPRO CARB STEADY)  237 mL Oral BID BM  . ferrous sulfate  325 mg Oral BID WC  . furosemide  160 mg Oral BID  . hydrALAZINE  100 mg Oral Q8H  . insulin aspart   0-20 Units Subcutaneous TID WC  . insulin aspart  0-5 Units Subcutaneous QHS  . isosorbide mononitrate  120 mg Oral Daily  . levothyroxine  112 mcg Oral QAC breakfast  . lidocaine  1 patch Transdermal Q24H  . magnesium gluconate  250 mg Oral Daily  . montelukast  10 mg Oral Daily  . 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   Admitted with respiratory failure and volume overload  Assessment/Plan   1. Acute Hypoxic Respiratory Failure: Resolved, currently on room air.   2. A/C Diastolic Heart Failure: ECHO 02/2017 EF 60-65% with severe MR. Volume status significantly improved, she does not appear volume overloaded.  Creatinine stable at 3.0.  Ultimately, need mitral valve fixed to keep her out of CHF.  - Continue Lasix 160 mg po bid. - Would make sure she has lasix ordered at time of discharge to SNF.  3. AKI on CKD Stage IV:  Had R hydronephrosis last month and required perc drain. Renal US 04/14/17 - no evidence of hydronephrosis.  Creatinine slowly trending down.  New baseline may be in the high 2's-3.     - Will need renal followup.  4. HTN: SBP stable on current regimen.  Avoid dropping BP low.    - Continue imdur 120 mg daily. - Continue hydralazine 100 mg three times a day  - Continue amlodipine 10 mg daily.  - Continue coreg 6.25 mg BID. Follow closely for bradycardia, would not increase.  - Continue doxazosin 2 mg daily.   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  Noted on TEE 02/2017.  Ideally, will have surgical valve replacement/repair, but  patient is markedly deconditioned and not ready for this yet.  She will also need cardiac cath prior which will be risky because of CKD stage IV. I think that she is going to have to work extensively with PT/rehab to improve strength  and become ambulatory (was only able to walk about 18 feet yesterday).  When mobility is improved, we will plan cardiac cath but with understanding that renal function could progressively worsen.  6. H/O spontaneous retroperitoneal bleed in 02/2017: off anticoagulants currently.  7.PAF: 02/2017 converted to NSR on amio. Developed RP hematoma so anticoagulants stopped.  Had been in junctional rhythm here and amiodarone stopped.  Went into atrial flutter over last weekend but now back in NSR.  Continue ted hose+ SCDs for DVT prophylaxis. - On low dose Coreg, would not increase.  8. CAD: Last admit LHC was considered but due to worsening renal failure this was deferred, will need prior to MV surgery.  She has had mild central chest pain for several days now.  She is tender to palpation at the site where she describes pain, so suspect musculoskeletal.   - I will send a troponin level again today to make sure it is not markedly up from prior.   - Continue ASA 81.  - LDL low at 48, would not add statin.  9. ID: Finished course of Zosyn for possible HCAP.  10. Deconditioning: Marked.   -  Will need PT daily.   Disposition: Will need SNF for rehab. Should be getting close.    Length of Stay: Diggins, MD  04/23/2017, 10:46 AM  Advanced Heart Failure Team Pager [phone] (M-F; 7a - 4p)  Please contact [facility] for night-coverage after hours (4p -7a ) and weekends on [email or web address]

## 2017-04-28 ENCOUNTER — Ambulatory Visit (HOSPITAL_COMMUNITY)
Admission: RE | Admit: 2017-04-28 | Discharge: 2017-04-28 | Disposition: A | Discharge status: Home / Self Care | Payer: Medicare Other | Source: Ambulatory Visit | Attending: Cardiology | Admitting: Cardiology

## 2017-04-28 ENCOUNTER — Encounter (HOSPITAL_COMMUNITY): Payer: Self-pay

## 2017-04-28 VITALS — BP 156/92 | HR 84 | Wt 121.0 lb

## 2017-04-28 DIAGNOSIS — Z794 Long term (current) use of insulin: Secondary | ICD-10-CM | POA: Insufficient documentation

## 2017-04-28 DIAGNOSIS — Z8249 Family history of ischemic heart disease and other diseases of the circulatory system: Secondary | ICD-10-CM | POA: Insufficient documentation

## 2017-04-28 DIAGNOSIS — Z888 Allergy status to other drugs, medicaments and biological substances status: Secondary | ICD-10-CM | POA: Insufficient documentation

## 2017-04-28 DIAGNOSIS — I340 Nonrheumatic mitral (valve) insufficiency: Secondary | ICD-10-CM | POA: Diagnosis not present

## 2017-04-28 DIAGNOSIS — I480 Paroxysmal atrial fibrillation: Secondary | ICD-10-CM | POA: Diagnosis not present

## 2017-04-28 DIAGNOSIS — I2510 Atherosclerotic heart disease of native coronary artery without angina pectoris: Secondary | ICD-10-CM | POA: Diagnosis not present

## 2017-04-28 DIAGNOSIS — Z9981 Dependence on supplemental oxygen: Secondary | ICD-10-CM | POA: Insufficient documentation

## 2017-04-28 DIAGNOSIS — E039 Hypothyroidism, unspecified: Secondary | ICD-10-CM | POA: Diagnosis not present

## 2017-04-28 DIAGNOSIS — Z7982 Long term (current) use of aspirin: Secondary | ICD-10-CM | POA: Insufficient documentation

## 2017-04-28 DIAGNOSIS — I130 Hypertensive heart and chronic kidney disease with heart failure and stage 1 through stage 4 chronic kidney disease, or unspecified chronic kidney disease: Secondary | ICD-10-CM | POA: Diagnosis not present

## 2017-04-28 DIAGNOSIS — E785 Hyperlipidemia, unspecified: Secondary | ICD-10-CM | POA: Diagnosis not present

## 2017-04-28 DIAGNOSIS — F419 Anxiety disorder, unspecified: Secondary | ICD-10-CM | POA: Insufficient documentation

## 2017-04-28 DIAGNOSIS — I10 Essential (primary) hypertension: Secondary | ICD-10-CM

## 2017-04-28 DIAGNOSIS — K0889 Other specified disorders of teeth and supporting structures: Secondary | ICD-10-CM | POA: Insufficient documentation

## 2017-04-28 DIAGNOSIS — Z87891 Personal history of nicotine dependence: Secondary | ICD-10-CM | POA: Insufficient documentation

## 2017-04-28 DIAGNOSIS — Z8673 Personal history of transient ischemic attack (TIA), and cerebral infarction without residual deficits: Secondary | ICD-10-CM | POA: Insufficient documentation

## 2017-04-28 DIAGNOSIS — Z952 Presence of prosthetic heart valve: Secondary | ICD-10-CM | POA: Insufficient documentation

## 2017-04-28 DIAGNOSIS — F329 Major depressive disorder, single episode, unspecified: Secondary | ICD-10-CM | POA: Diagnosis not present

## 2017-04-28 DIAGNOSIS — N184 Chronic kidney disease, stage 4 (severe): Secondary | ICD-10-CM | POA: Diagnosis not present

## 2017-04-28 DIAGNOSIS — R58 Hemorrhage, not elsewhere classified: Secondary | ICD-10-CM | POA: Diagnosis not present

## 2017-04-28 DIAGNOSIS — K219 Gastro-esophageal reflux disease without esophagitis: Secondary | ICD-10-CM | POA: Diagnosis not present

## 2017-04-28 DIAGNOSIS — I252 Old myocardial infarction: Secondary | ICD-10-CM | POA: Diagnosis not present

## 2017-04-28 DIAGNOSIS — I5032 Chronic diastolic (congestive) heart failure: Secondary | ICD-10-CM | POA: Insufficient documentation

## 2017-04-28 DIAGNOSIS — I5031 Acute diastolic (congestive) heart failure: Secondary | ICD-10-CM | POA: Diagnosis not present

## 2017-04-28 LAB — BASIC METABOLIC PANEL
Anion gap: 11 (ref 5–15)
BUN: 23 mg/dL — ABNORMAL HIGH (ref 6–20)
CO2: 31 mmol/L (ref 22–32)
Calcium: 9.9 mg/dL (ref 8.9–10.3)
Chloride: 92 mmol/L — ABNORMAL LOW (ref 101–111)
Creatinine, Ser: 2.88 mg/dL — ABNORMAL HIGH (ref 0.44–1.00)
GFR calc Af Amer: 19 mL/min — ABNORMAL LOW (ref >60)
GFR calc non Af Amer: 16 mL/min — ABNORMAL LOW (ref >60)
Glucose, Bld: 209 mg/dL — ABNORMAL HIGH (ref 65–99)
Potassium: 3.8 mmol/L (ref 3.5–5.1)
Sodium: 134 mmol/L — ABNORMAL LOW (ref 135–145)

## 2017-04-28 MED ORDER — FUROSEMIDE 80 MG PO TABS: 120.0000 mg | ORAL_TABLET | Freq: Two times a day (BID) | ORAL | 6 refills | Status: DC

## 2017-04-28 NOTE — Patient Instructions (Signed)
INCREASE lasix 120 mg (1.5 tabs) twice daily.  Routine lab work today. Will notify you of abnormal results, otherwise no news is good news!  Follow up 2 months with Dr. Aundra Dubin.  _______________________________________________________________  Jocelyn Hill Code:   Take all medication as prescribed the day of your appointment. Bring all medications with you to your appointment.  Do the following things EVERYDAY: 1) Weigh yourself in the morning before breakfast. Write it down and keep it in a log. 2) Take your medicines as prescribed 3) Eat low salt foods-Limit salt (sodium) to 2000 mg per day.  4) Stay as active as you can everyday 5) Limit all fluids for the day to less than 2 liters

## 2017-04-28 NOTE — Progress Notes (Signed)
Advanced Heart Failure Clinic Note  Primary Cardiologist: Dr. Bettina Gavia  HPI: Jocelyn Hill is a 66 y.o. female with h/o MR, diastolic CHF, Afib, CAD s/p MI, long standing HTN, DM2, CKD IV-V, h/o Stroke. Anxiety, depression and GERD.  Per pt has had 2 MIs treated at [facility] in [place] several years ago.   [facility] to [facility] [address] on 03/12/2017 with acute hypoxic respiratory failure. ECHO showed normal LV function with severe MR. Diuresed with IV lasix over 20 pounds. She was being considered for MVR by Dr Roxy Manns but due to multiple complications she was not a candidate. Hospital course was complicated by acute respiratory failure, acute renal failure, A Fib, retroperitoneal bleed, r hydronephrosis (perc tube) and e- coli bacteremia. Creatinine peaked at 5.She did not require dialysis and gradually renal function improved.Discharged to SNF She was not on diuretics. Discharge weight was 124 pounds.   Admitted 04/12/17-04/23/17 with respiratory failure from [facility]. She had gained 16 pounds at [facility].  CXR with bilateral infiltrates. Placed on vanc and zosyn. Pertinent admission labs included BNP > 3000, creatinine 3.75, K 4.1, WBC 16.1, procalcitonin 2.06, and troponin 0.04. Requiring 100%  high flow oxygen. Blood CX - NGTD. Diuresing with high dose IV lasix. Urine output improving with 160 mg IV lasix. Nephrology consulted, renal US with no evidence of hydronephrosis (pt. Had perc drain the month prior due to this). Also placed on NTG drip for HTN and CP. She was seen by Dr. Roxy Manns and felt not to be a MVR candidate. Discharged on Lasix 160 mg BID. Discharge weight was 114 pounds.   TTE 03/12/17 LVEF 60-65%, Grade 2 DD, Severe MR, Severe LAE, Mild RAE, Mild/Mod TR, PA peak pressure 60 mm hg, small/mod pericardial effusion.  TEE 03/17/17 LVEF 55-60%, Trivial TR, Severe MR, mild LAE, mild mod TR. No evidence of vegetation.  Returns today for HF follow up. She is residing at a SNF.  Recently (about 3 days ado), her lasix was decreased to 20 mg BID. Her discharge lasix dose was 160 mg BID. She says she was told that "this is too much Lasix" by the doctor at the facility and therefore it was decreased. She feels SOB with working with PT. Mobility has increased somewhat, but she still struggles with exertional SOB. Endorses PND, has chronic 2 pillow orthopnea. Denies chest pain and palpitations.    Review of Systems: [y] = yes, [ ]  = no   General: Weight gain [ ] ; Weight loss [ ] ; Anorexia [ ] ; Fatigue [ y]; Fever [ ] ; Chills [ ] ; Weakness [ ]   Cardiac: Chest pain/pressure [ ] ; Resting SOB [ ] ; Exertional SOB Blue.Reese ]; Orthopnea [ y]; Pedal Edema [ ] ; Palpitations [ ] ; Syncope [ ] ; Presyncope [ ] ; Paroxysmal nocturnal dyspnea[ ]   Pulmonary: Cough [ ] ; Wheezing[ ] ; Hemoptysis[ ] ; Sputum [ ] ; Snoring [ ]   GI: Vomiting[ ] ; Dysphagia[ ] ; Melena[ ] ; Hematochezia [ ] ; Heartburn[ ] ; Abdominal pain [ ] ; Constipation [ ] ; Diarrhea [ ] ; BRBPR [ ]   GU: Hematuria[ ] ; Dysuria [ ] ; Nocturia[ ]   Vascular: Pain in legs with walking [ ] ; Pain in feet with lying flat [ ] ; Non-healing sores [ ] ; Stroke [ ] ; TIA [ ] ; Slurred speech [ ] ;  Neuro: Headaches[ ] ; Vertigo[ ] ; Seizures[ ] ; Paresthesias[ ] ;Blurred vision [ ] ; Diplopia [ ] ; Vision changes [ ]   Ortho/Skin: Arthritis Blue.Reese ]; Joint pain [ ] ; Muscle pain [ ] ; Joint swelling [ ] ; Back Pain [ ] ;  Rash [ ]   Psych: Depression[ ] ; Anxiety[ ]   Heme: Bleeding problems [ ] ; Clotting disorders [ ] ; Anemia [ ]   Endocrine: Diabetes [ ] ; Thyroid dysfunction[ ]    Past Medical History:  Diagnosis Date  . Anxiety   . Arthritis   . Bronchitis   . CKD (chronic kidney disease) stage 4, GFR 15-29 ml/min (HCC) 03/16/2017  . Depression   . Diabetes mellitus without complication ([place])   . Diet-controlled diabetes mellitus ([place])   . GERD (gastroesophageal reflux disease)   . Hyperlipidemia   . Hypertension   . Hypothyroidism   . Stroke [facility])     Current  Outpatient Prescriptions  Medication Sig Dispense Refill  . amLODipine (NORVASC) 10 MG tablet Take 1 tablet (10 mg total) by mouth daily. 30 tablet 0  . carvedilol (COREG) 6.25 MG tablet Take 1 tablet (6.25 mg total) by mouth 2 (two) times daily with a meal. 60 tablet 0  . hydrALAZINE (APRESOLINE) 100 MG tablet Take 1 tablet (100 mg total) by mouth every 8 (eight) hours. 90 tablet 0  . isosorbide mononitrate (IMDUR) 120 MG 24 hr tablet Take 1 tablet (120 mg total) by mouth daily. 30 tablet 0  . acetaminophen (TYLENOL) 325 MG tablet Take 2 tablets (650 mg total) by mouth every 6 (six) hours as needed for mild pain, fever or headache. 30 tablet 0  . albuterol (PROVENTIL) (2.5 MG/3ML) 0.083% nebulizer solution Take 2.5 mg by nebulization every 6 (six) hours as needed for shortness of breath.    Marland Kitchen aspirin EC 81 MG EC tablet Take 1 tablet (81 mg total) by mouth daily. 30 tablet 0  . atorvastatin (LIPITOR) 40 MG tablet Take 1 tablet (40 mg total) by mouth daily. 30 tablet 0  . b complex vitamins tablet Take 1 tablet by mouth daily.    Marland Kitchen buPROPion (WELLBUTRIN XL) 300 MG 24 hr tablet Take 1 tablet (300 mg total) by mouth daily. 30 tablet 0  . busPIRone (BUSPAR) 15 MG tablet Take 1 tablet (15 mg total) by mouth 2 (two) times daily. 60 tablet 0  . cholecalciferol (VITAMIN D) 1000 units tablet Take 1,000 Units by mouth daily.    . Darbepoetin Alfa (ARANESP) 40 MCG/0.4ML SOSY injection Inject 0.4 mLs (40 mcg total) into the skin every Tuesday at 6 PM. 8.4 mL 0  . docusate sodium (COLACE) 100 MG capsule Take 1 capsule (100 mg total) by mouth 2 (two) times daily. 10 capsule 0  . doxazosin (CARDURA) 2 MG tablet Take 1 tablet (2 mg total) by mouth daily. 30 tablet 0  . DULoxetine (CYMBALTA) 30 MG capsule Take 1 capsule (30 mg total) by mouth daily. 30 capsule 0  . ferrous sulfate 325 (65 FE) MG tablet Take 1 tablet (325 mg total) by mouth 2 (two) times daily with a meal. 60 tablet 0  . furosemide (LASIX) 80 MG  tablet Take 2 tablets (160 mg total) by mouth 2 (two) times daily. 120 tablet 0  . insulin aspart (NOVOLOG) 100 UNIT/ML injection Inject 0-20 Units into the skin 3 (three) times daily with meals. 10 mL 11  . levothyroxine (SYNTHROID, LEVOTHROID) 112 MCG tablet Take 1 tablet (112 mcg total) by mouth daily before breakfast. 30 tablet 0  . lidocaine (LIDODERM) 5 % Place 1 patch onto the skin daily. Remove & Discard patch within 12 hours or as directed by MD 30 patch 0  . magnesium gluconate (MAGONATE) 500 MG tablet Take 0.5 tablets (250 mg total) by  mouth daily. 30 tablet 0  . montelukast (SINGULAIR) 10 MG tablet Take 1 tablet (10 mg total) by mouth daily. 30 tablet 0  . nitroGLYCERIN (NITROSTAT) 0.4 MG SL tablet Place 1 tablet (0.4 mg total) under the tongue every 5 (five) minutes as needed for chest pain. 30 tablet 0  . Nutritional Supplements (FEEDING SUPPLEMENT, NEPRO CARB STEADY,) LIQD Take 237 mLs by mouth 2 (two) times daily between meals. 237 mL 0  . ondansetron (ZOFRAN) 4 MG tablet Take 1 tablet (4 mg total) by mouth every 8 (eight) hours as needed for nausea or vomiting. 30 tablet 0  . oxyCODONE (OXY IR/ROXICODONE) 5 MG immediate release tablet Take 1 tablet (5 mg total) by mouth every 8 (eight) hours as needed (pain). 6 tablet 0  . OXYGEN Inhale 2-4 L into the lungs as needed (for shortness of breath/ to keep oxygen levels above 93%).    . pantoprazole (PROTONIX) 40 MG tablet Take 1 tablet (40 mg total) by mouth 2 (two) times daily before a meal. 60 tablet 0  . polyethylene glycol (MIRALAX / GLYCOLAX) packet Take 17 g by mouth daily as needed (constipation).     No current facility-administered medications for this encounter.     Allergies  Allergen Reactions  . Ace Inhibitors Anaphylaxis and Swelling      Social History   Social History  . Marital status: Divorced    Spouse name: N/A  . Number of children: N/A  . Years of education: N/A   Occupational History  . Not on file.     Social History Main Topics  . Smoking status: Former Smoker    Types: Cigarettes  . Smokeless tobacco: Never Used  . Alcohol use No  . Drug use: No  . Sexual activity: No   Other Topics Concern  . Not on file   Social History Narrative  . No narrative on file      Family History  Problem Relation Age of Onset  . Hypertension Mother     Vitals:   04/28/17 1035  BP: (!) 156/92  Pulse: 84  SpO2: 98%  Weight: 121 lb (54.9 kg)     PHYSICAL EXAM: General:  Chronically ill appearing. Arrived in wheelchair.  HEENT: normal Neck: supple. JVD to jaw. Carotids 2+ bilat; no bruits. No lymphadenopathy or thyromegaly appreciated. Cor: PMI nondisplaced. Regular rate & rhythm. 3/6 SEM Lungs: clear in upper lobes, diminished in bases.  Abdomen: soft, nontender, nondistended. No hepatosplenomegaly. No bruits or masses. Good bowel sounds. Extremities: no cyanosis, clubbing, rash, edema Neuro: alert & oriented x 3, cranial nerves grossly intact. moves all 4 extremities w/o difficulty. Affect pleasant.    ASSESSMENT & PLAN:  1. Chronic diastolic heart failure: ECHO 02/2017 EF 60-65% with severe MR.  - NYHA III - Volume elevated on exam. Discussed with Dr. Aundra Dubin. Increase lasix dose to 120 mg BID. She is at high risk for re-admission if volume status does not stay controlled. BMET today. I left a voicemail for the NP at the facility the patient resides in - NP Raquel Sarna to discuss Lasix dose.   2. CKD Stage IV:  Had R hydronephrosis in 02/2017 and required perc drain. Renal US 04/14/17 - no evidence of hydronephrosis.   - BMET today.   3. HTN:  - Stable on current regimen.  - Continue Coreg 6.25 mg BID - Continue amlodipine 10 mg daily - Continue doxazosin 2 mg daily - Continue hydralazine 100 mg TID - Continue isosorbide  120 mg daily.   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  Noted on TEE 02/2017.  Ideally, will have surgical valve replacement/repair, but patient is  markedly deconditioned and not ready for this yet.  She will also need cardiac cath prior which will be risky because of CKD stage IV.  She is going to have to work extensively with PT/rehab to improve strength and become ambulatory (was only able to walk about 18 feet yesterday).  When mobility is improved, we will plan cardiac cath but with understanding that renal function could progressively worsen.  - No change to current plan. Continue PT at SNF.   5. H/O spontaneous retroperitoneal bleed in 02/2017:  - Not on anticoagulation.   6.PAF: 02/2017 converted to NSR on amio.  - NSR today.  - Continue Coreg.  - Not on anticoagulation with history of retroperitoneal bleed.   7. CAD:   - Continue ASA 81.  - LDL low at 48, would not add statin.   8. Tooth pain: apparently needs multiple tooth extractions. Per Dr. Aundra Dubin, hold off on this until she improves.   Follow up in 2 months.    Arbutus Leas, NP 04/28/17

## 2017-06-15 ENCOUNTER — Encounter: Payer: Self-pay | Admitting: Thoracic Surgery (Cardiothoracic Vascular Surgery)

## 2017-06-15 ENCOUNTER — Ambulatory Visit (INDEPENDENT_AMBULATORY_CARE_PROVIDER_SITE_OTHER): Payer: Medicare Other | Admitting: Thoracic Surgery (Cardiothoracic Vascular Surgery)

## 2017-06-15 ENCOUNTER — Other Ambulatory Visit: Payer: Self-pay

## 2017-06-15 VITALS — BP 161/100 | HR 95 | Ht 69.0 in | Wt 121.8 lb

## 2017-06-15 DIAGNOSIS — I051 Rheumatic mitral insufficiency: Secondary | ICD-10-CM | POA: Diagnosis not present

## 2017-06-15 DIAGNOSIS — I340 Nonrheumatic mitral (valve) insufficiency: Secondary | ICD-10-CM

## 2017-06-15 NOTE — Patient Instructions (Signed)
Continue all previous medications without any changes at this time  

## 2017-06-15 NOTE — Progress Notes (Addendum)
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  I had the opportunity to evaluate her initially in consultation on March 21, 2017.  Under the circumstances she was not felt to be a candidate for any type of surgical intervention, but the possibility of elective surgery in the future was left open pending her ability to recover from her acute illness and severely debilitated state.  Her congestive heart failure and cardiac problems were managed by Dr. Aundra Dubin in the advanced heart failure team.  She was ultimately discharged to a skilled nursing facility and more recently she has been staying with her daughter.  She is scheduled to follow-up with Dr. Aundra Dubin in the advanced heart failure clinic in the near future, and depending on how she is doing she will need to undergo diagnostic cardiac catheterization and follow-up echocardiogram.  None of this has been performed yet.  She returned to our office today prior to being evaluated by Dr. Aundra Dubin, which was not the plan at the time of hospital discharge.  She reports that if she still gets substernal chest tightness and shortness of breath.  Clinically she has improved.   Current Outpatient Medications  Medication Sig Dispense Refill  .  acetaminophen (TYLENOL) 325 MG tablet Take 2 tablets (650 mg total) by mouth every 6 (six) hours as needed for mild pain, fever or headache. 30 tablet 0  . amLODipine (NORVASC) 10 MG tablet Take 1 tablet (10 mg total) by mouth daily. 30 tablet 0  . aspirin EC 81 MG EC tablet Take 1 tablet (81 mg total) by mouth daily. 30 tablet 0  . atorvastatin (LIPITOR) 40 MG tablet Take 1 tablet (40 mg total) by mouth daily. 30 tablet 0  . b complex vitamins tablet Take 1 tablet by mouth daily.    Marland Kitchen buPROPion (WELLBUTRIN XL) 300 MG 24 hr tablet Take 1 tablet (300 mg total) by mouth daily. 30 tablet 0  . busPIRone (BUSPAR) 15 MG tablet Take 1 tablet (15 mg total) by mouth 2 (two) times daily. 60 tablet 0  . carvedilol (COREG) 6.25 MG tablet Take 1 tablet (6.25 mg total) by mouth 2 (two) times daily with a meal. 60 tablet 0  . cholecalciferol (VITAMIN D) 1000 units tablet Take 1,000 Units by mouth daily.    . Darbepoetin Alfa (ARANESP) 40 MCG/0.4ML SOSY injection Inject 0.4 mLs (40 mcg total) into the skin every Tuesday at 6 PM. 8.4 mL 0  . docusate sodium (COLACE) 100 MG capsule Take 1 capsule (100 mg total) by mouth 2 (two) times daily. 10 capsule 0  . doxazosin (CARDURA) 2 MG tablet Take 1 tablet (2 mg total) by mouth daily. 30 tablet 0  . DULoxetine (CYMBALTA) 30 MG capsule Take 1 capsule (30 mg total) by mouth daily. 30 capsule 0  .  ferrous sulfate 325 (65 FE) MG tablet Take 1 tablet (325 mg total) by mouth 2 (two) times daily with a meal. 60 tablet 0  . furosemide (LASIX) 80 MG tablet Take 1.5 tablets (120 mg total) by mouth 2 (two) times daily. 90 tablet 6  . hydrALAZINE (APRESOLINE) 100 MG tablet Take 1 tablet (100 mg total) by mouth every 8 (eight) hours. 90 tablet 0  . insulin aspart (NOVOLOG) 100 UNIT/ML injection Inject 0-20 Units into the skin 3 (three) times daily with meals. 10 mL 11  . isosorbide mononitrate (IMDUR) 120 MG 24 hr tablet Take 1 tablet (120 mg total) by mouth daily. 30 tablet 0  .  levothyroxine (SYNTHROID, LEVOTHROID) 112 MCG tablet Take 1 tablet (112 mcg total) by mouth daily before breakfast. 30 tablet 0  . lidocaine (LIDODERM) 5 % Place 1 patch onto the skin daily. Remove & Discard patch within 12 hours or as directed by MD 30 patch 0  . magnesium gluconate (MAGONATE) 500 MG tablet Take 0.5 tablets (250 mg total) by mouth daily. 30 tablet 0  . montelukast (SINGULAIR) 10 MG tablet Take 1 tablet (10 mg total) by mouth daily. 30 tablet 0  . nitroGLYCERIN (NITROSTAT) 0.4 MG SL tablet Place 1 tablet (0.4 mg total) under the tongue every 5 (five) minutes as needed for chest pain. 30 tablet 0  . Nutritional Supplements (FEEDING SUPPLEMENT, NEPRO CARB STEADY,) LIQD Take 237 mLs by mouth 2 (two) times daily between meals. 237 mL 0  . ondansetron (ZOFRAN) 4 MG tablet Take 1 tablet (4 mg total) by mouth every 8 (eight) hours as needed for nausea or vomiting. 30 tablet 0  . oxyCODONE (OXY IR/ROXICODONE) 5 MG immediate release tablet Take 1 tablet (5 mg total) by mouth every 8 (eight) hours as needed (pain). 6 tablet 0  . OXYGEN Inhale 2-4 L into the lungs as needed (for shortness of breath/ to keep oxygen levels above 93%).    . pantoprazole (PROTONIX) 40 MG tablet Take 1 tablet (40 mg total) by mouth 2 (two) times daily before a meal. 60 tablet 0  . polyethylene glycol (MIRALAX / GLYCOLAX) packet Take 17 g by mouth daily as needed (constipation).     No current facility-administered medications for this visit.       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  She appears to be making steady progress.  She would need to undergo follow-up echocardiogram and left and right heart catheterization if the left active surgical intervention were to be contemplated.  It is conceivable that she might be a candidate for palliative edge-to-edge Mitraclip if she's not a candidate for conventional surgery.  She will need to undergo dental extraction if either option is to be entertained.  None of this is been scheduled yet.  Plan:  The patient will keep her previously scheduled appointment with Dr. Aundra Dubin  in the advanced heart failure clinic.  She will be referred to the dental clinic consultation.  She will return to our office once she is felt to be clinically stable and ready for possible high risk surgical intervention.    Valentina Gu. Roxy Manns, MD 06/15/2017 4:15 PM

## 2017-06-18 ENCOUNTER — Telehealth (HOSPITAL_COMMUNITY): Payer: Self-pay

## 2017-06-18 ENCOUNTER — Encounter (HOSPITAL_COMMUNITY): Payer: Self-pay

## 2017-06-18 ENCOUNTER — Encounter (HOSPITAL_COMMUNITY): Payer: Self-pay | Admitting: Cardiology

## 2017-06-18 ENCOUNTER — Other Ambulatory Visit (HOSPITAL_COMMUNITY): Payer: Self-pay | Admitting: *Deleted

## 2017-06-18 ENCOUNTER — Other Ambulatory Visit (HOSPITAL_COMMUNITY): Payer: Self-pay

## 2017-06-18 ENCOUNTER — Ambulatory Visit (HOSPITAL_COMMUNITY)
Admission: RE | Admit: 2017-06-18 | Discharge: 2017-06-18 | Disposition: A | Discharge status: Home / Self Care | Payer: Medicare Other | Source: Ambulatory Visit | Attending: Cardiology | Admitting: Cardiology

## 2017-06-18 VITALS — BP 180/99 | HR 83 | Wt 123.4 lb

## 2017-06-18 DIAGNOSIS — I10 Essential (primary) hypertension: Secondary | ICD-10-CM

## 2017-06-18 DIAGNOSIS — Z794 Long term (current) use of insulin: Secondary | ICD-10-CM | POA: Insufficient documentation

## 2017-06-18 DIAGNOSIS — Z9981 Dependence on supplemental oxygen: Secondary | ICD-10-CM | POA: Diagnosis not present

## 2017-06-18 DIAGNOSIS — I5032 Chronic diastolic (congestive) heart failure: Secondary | ICD-10-CM

## 2017-06-18 DIAGNOSIS — K219 Gastro-esophageal reflux disease without esophagitis: Secondary | ICD-10-CM | POA: Insufficient documentation

## 2017-06-18 DIAGNOSIS — F329 Major depressive disorder, single episode, unspecified: Secondary | ICD-10-CM | POA: Diagnosis not present

## 2017-06-18 DIAGNOSIS — Z7982 Long term (current) use of aspirin: Secondary | ICD-10-CM | POA: Insufficient documentation

## 2017-06-18 DIAGNOSIS — I130 Hypertensive heart and chronic kidney disease with heart failure and stage 1 through stage 4 chronic kidney disease, or unspecified chronic kidney disease: Secondary | ICD-10-CM | POA: Insufficient documentation

## 2017-06-18 DIAGNOSIS — R58 Hemorrhage, not elsewhere classified: Secondary | ICD-10-CM

## 2017-06-18 DIAGNOSIS — Z79890 Hormone replacement therapy (postmenopausal): Secondary | ICD-10-CM | POA: Insufficient documentation

## 2017-06-18 DIAGNOSIS — Z79899 Other long term (current) drug therapy: Secondary | ICD-10-CM | POA: Insufficient documentation

## 2017-06-18 DIAGNOSIS — I340 Nonrheumatic mitral (valve) insufficiency: Secondary | ICD-10-CM

## 2017-06-18 DIAGNOSIS — I2510 Atherosclerotic heart disease of native coronary artery without angina pectoris: Secondary | ICD-10-CM | POA: Diagnosis not present

## 2017-06-18 DIAGNOSIS — N184 Chronic kidney disease, stage 4 (severe): Secondary | ICD-10-CM

## 2017-06-18 DIAGNOSIS — I480 Paroxysmal atrial fibrillation: Secondary | ICD-10-CM | POA: Diagnosis not present

## 2017-06-18 DIAGNOSIS — R0789 Other chest pain: Secondary | ICD-10-CM | POA: Diagnosis not present

## 2017-06-18 DIAGNOSIS — E1122 Type 2 diabetes mellitus with diabetic chronic kidney disease: Secondary | ICD-10-CM | POA: Diagnosis not present

## 2017-06-18 DIAGNOSIS — Z888 Allergy status to other drugs, medicaments and biological substances status: Secondary | ICD-10-CM | POA: Insufficient documentation

## 2017-06-18 DIAGNOSIS — Z87891 Personal history of nicotine dependence: Secondary | ICD-10-CM | POA: Insufficient documentation

## 2017-06-18 DIAGNOSIS — Z8673 Personal history of transient ischemic attack (TIA), and cerebral infarction without residual deficits: Secondary | ICD-10-CM | POA: Insufficient documentation

## 2017-06-18 DIAGNOSIS — E785 Hyperlipidemia, unspecified: Secondary | ICD-10-CM | POA: Insufficient documentation

## 2017-06-18 DIAGNOSIS — F410 Panic disorder [episodic paroxysmal anxiety] without agoraphobia: Secondary | ICD-10-CM | POA: Insufficient documentation

## 2017-06-18 DIAGNOSIS — Z8249 Family history of ischemic heart disease and other diseases of the circulatory system: Secondary | ICD-10-CM | POA: Insufficient documentation

## 2017-06-18 LAB — LIPID PANEL
Cholesterol: 159 mg/dL (ref 0–200)
HDL: 66 mg/dL (ref >40)
LDL Cholesterol: 64 mg/dL (ref 0–99)
Total CHOL/HDL Ratio: 2.4 RATIO
Triglycerides: 146 mg/dL (ref <150)
VLDL: 29 mg/dL (ref 0–40)

## 2017-06-18 LAB — CBC
HCT: 40.9 % (ref 36.0–46.0)
Hemoglobin: 14.2 g/dL (ref 12.0–15.0)
MCH: 26.1 pg (ref 26.0–34.0)
MCHC: 34.7 g/dL (ref 30.0–36.0)
MCV: 75.2 fL — ABNORMAL LOW (ref 78.0–100.0)
Platelets: 253 10*3/uL (ref 150–400)
RBC: 5.44 MIL/uL — ABNORMAL HIGH (ref 3.87–5.11)
RDW: 14.5 % (ref 11.5–15.5)
WBC: 11 10*3/uL — ABNORMAL HIGH (ref 4.0–10.5)

## 2017-06-18 LAB — BASIC METABOLIC PANEL
Anion gap: 11 (ref 5–15)
BUN: 35 mg/dL — ABNORMAL HIGH (ref 6–20)
CO2: 27 mmol/L (ref 22–32)
Calcium: 9.6 mg/dL (ref 8.9–10.3)
Chloride: 99 mmol/L — ABNORMAL LOW (ref 101–111)
Creatinine, Ser: 3.35 mg/dL — ABNORMAL HIGH (ref 0.44–1.00)
GFR calc Af Amer: 16 mL/min — ABNORMAL LOW (ref >60)
GFR calc non Af Amer: 13 mL/min — ABNORMAL LOW (ref >60)
Glucose, Bld: 169 mg/dL — ABNORMAL HIGH (ref 65–99)
Potassium: 3.2 mmol/L — ABNORMAL LOW (ref 3.5–5.1)
Sodium: 137 mmol/L (ref 135–145)

## 2017-06-18 MED ORDER — FUROSEMIDE 80 MG PO TABS: ORAL_TABLET | ORAL | 3 refills | Status: DC

## 2017-06-18 NOTE — Progress Notes (Signed)
Pre cath letter  

## 2017-06-18 NOTE — Telephone Encounter (Signed)
Notes recorded by Shirley Muscat, RN on 06/18/2017 at 2:34 PM EST Pt aware of results, agreeable to med changes (order placed, Changes made in [facility]), will have order redone with Cath 07/03/2016  ------  Notes recorded by Darron Doom, RN on 06/18/2017 at 2:25 PM EST Called and left message to call us back. ------  Notes recorded by Larey Dresser, MD on 06/18/2017 at 2:22 PM EST Hold Lasix for 1 day then decrease to 120 mg bid. Repeat BMET next week. ------  Notes recorded by Shirley Muscat, RN on 06/18/2017 at 2:12 PM EST Lipid Letter mailed to patient  ------  Notes recorded by Larey Dresser, MD on 06/18/2017 at 2:11 PM EST Good lipids and CBC

## 2017-06-18 NOTE — H&P (View-Only) (Signed)
Advanced Heart Failure Clinic Note   PCP: Dr. Dorita Fray ([place]) HF Cardiology: Dr. Aundra Dubin  HPI: Jocelyn Hill. Langton is a 66 y.o. female with h/o mitral regurgitation, chronic diastolic CHF, CAD s/p MI, long standing HTN, DM2, CKD IV-V, h/o Stroke. Anxiety, depression and GERD. Patient reported that she has had 2 MIs treated at [facility] in [place] several years ago.   Presented to [facility] on 03/12/2017 with acute hypoxic respiratory failure. ECHO showed normal LV function with severe MR. Diuresed with IV lasix over 20 pounds. She was being considered for MVR by Dr Roxy Manns but due to multiple complications she was not a candidate. Hospital course was complicated by acute respiratory failure, acute renal failure, atrial fibrillation, retroperitoneal bleed, r hydronephrosis (perc tube) and E coli bacteremia. She was started on anticoagulation for atrial fibrillation but this was stopped with RP hematoma.  She converted to NSR on amiodarone but developed junctional rhythm so amiodarone was stopped. Creatinine peaked at 5.  She did not require dialysis and gradually renal function improved. Discharged to SNF without diuretics. Discharge weight was 124 pounds.   Admitted again 04/12/17-04/23/17 with respiratory failure from [facility]. She had gained 16 pounds at SNF as she was not on diuretic.  CXR with bilateral infiltrates. Placed on vanc and zosyn. Pertinent admission labs included BNP > 3000, creatinine 3.75, K 4.1, WBC 16.1, procalcitonin 2.06, and troponin 0.04. Requiring 100% high flow oxygen. Blood CX - NGTD. Diuresing with high dose IV lasix.  Nephrology consulted, renal US with no evidence of hydronephrosis (pt had percutaneous nephrostomy drain the month prior due to this). Also placed on NTG drip for HTN and CP. She was seen by Dr. Roxy Manns again and felt still to not be a MVR candidate. Discharged on Lasix 160 mg BID. Discharge weight was 114 pounds.   TTE 03/12/17 LVEF 60-65%,  Grade 2 DD, Severe MR, Severe LAE, Mild RAE, Mild/Mod TR, PA peak pressure 60 mm hg, small/mod pericardial effusion.  TEE 03/17/17 LVEF 55-60%, Trivial TR, Severe MR, mild LAE, mild mod TR. No evidence of vegetation.  She returns today for followup of CHF. She is now home from SNF and living with her daughter in [place].  She is short of breath walking about 50 yards or walking up an incline. She gets short of breath with dressing.  She has had several episodes of chest pain.  Monday, she had chest tightness while in the car, this resolved after about 20 minutes. Last night, she had an episode of chest pain lasting for 1.5 hours with radiation to her arm. She took a NTG which helped. Her chest pain has not been exertional.  BP is high today but she has only taken hydralazine.  She also reports "panic attacks" where her hands and feet will get numb and she gets short of breath (triggered by emotional stress).   ECG (10/18): NSR, old ASMI (personally reviewed)  Labs (10/18): K 3.8, creatinine 2.88  Review of Systems: All systems reviewed and negative except as per HPI.   PMH: 1. Chronic diastolic CHF:  - TTE 2/42/35 LVEF 60-65%, Grade 2 DD, Severe MR, Severe LAE, Mild RAE, Mild/Mod TR, PA peak pressure 60 mm hg, small/mod pericardial effusion. 2. CKD: stage IV.  3. Anxiety/panic attacks 4. DM 5. HTN 6. CAD: Patient reports prior MI treated at [facility].  No records available 7. Retroperitoneal hematoma: With anticoagulation in 9/18.  8. Hydronephrosis due to RP hematoma: Resolved.  9. GERD  10. Hyperlipidemia 11. H/o CVA 12. 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  - TEE 03/17/17 LVEF 55-60%, Trivial TR, Severe MR, mild LAE, mild mod TR. No evidence of vegetation. 13. Atrial fibrillation: Paroxysmal.  Noted in 9/18, not anticoagulated currently due to RP hematoma.  - Junctional rhythm on amiodarone, amiodarone stopped.   Current Outpatient Medications  Medication  Sig Dispense Refill  . acetaminophen (TYLENOL) 325 MG tablet Take 2 tablets (650 mg total) by mouth every 6 (six) hours as needed for mild pain, fever or headache. 30 tablet 0  . amLODipine (NORVASC) 10 MG tablet Take 1 tablet (10 mg total) by mouth daily. 30 tablet 0  . aspirin EC 81 MG EC tablet Take 1 tablet (81 mg total) by mouth daily. 30 tablet 0  . atorvastatin (LIPITOR) 40 MG tablet Take 1 tablet (40 mg total) by mouth daily. 30 tablet 0  . b complex vitamins tablet Take 1 tablet by mouth daily.    Marland Kitchen buPROPion (WELLBUTRIN XL) 300 MG 24 hr tablet Take 1 tablet (300 mg total) by mouth daily. 30 tablet 0  . busPIRone (BUSPAR) 15 MG tablet Take 1 tablet (15 mg total) by mouth 2 (two) times daily. 60 tablet 0  . carvedilol (COREG) 6.25 MG tablet Take 1 tablet (6.25 mg total) by mouth 2 (two) times daily with a meal. 60 tablet 0  . cholecalciferol (VITAMIN D) 1000 units tablet Take 1,000 Units by mouth daily.    . Darbepoetin Alfa (ARANESP) 40 MCG/0.4ML SOSY injection Inject 0.4 mLs (40 mcg total) into the skin every Tuesday at 6 PM. 8.4 mL 0  . docusate sodium (COLACE) 100 MG capsule Take 1 capsule (100 mg total) by mouth 2 (two) times daily. 10 capsule 0  . doxazosin (CARDURA) 2 MG tablet Take 1 tablet (2 mg total) by mouth daily. 30 tablet 0  . DULoxetine (CYMBALTA) 30 MG capsule Take 1 capsule (30 mg total) by mouth daily. 30 capsule 0  . ferrous sulfate 325 (65 FE) MG tablet Take 1 tablet (325 mg total) by mouth 2 (two) times daily with a meal. 60 tablet 0  . furosemide (LASIX) 80 MG tablet Take 160 mg (2 Tabs) in the AM and 120 mg (1.5 Tabs) In the PM. 105 tablet 3  . hydrALAZINE (APRESOLINE) 100 MG tablet Take 1 tablet (100 mg total) by mouth every 8 (eight) hours. 90 tablet 0  . insulin aspart (NOVOLOG) 100 UNIT/ML injection Inject 0-20 Units into the skin 3 (three) times daily with meals. 10 mL 11  . isosorbide mononitrate (IMDUR) 120 MG 24 hr tablet Take 1 tablet (120 mg total) by mouth  daily. 30 tablet 0  . levothyroxine (SYNTHROID, LEVOTHROID) 112 MCG tablet Take 1 tablet (112 mcg total) by mouth daily before breakfast. 30 tablet 0  . lidocaine (LIDODERM) 5 % Place 1 patch onto the skin daily. Remove & Discard patch within 12 hours or as directed by MD 30 patch 0  . magnesium gluconate (MAGONATE) 500 MG tablet Take 0.5 tablets (250 mg total) by mouth daily. 30 tablet 0  . montelukast (SINGULAIR) 10 MG tablet Take 1 tablet (10 mg total) by mouth daily. 30 tablet 0  . nitroGLYCERIN (NITROSTAT) 0.4 MG SL tablet Place 1 tablet (0.4 mg total) under the tongue every 5 (five) minutes as needed for chest pain. 30 tablet 0  . Nutritional Supplements (FEEDING SUPPLEMENT, NEPRO CARB STEADY,) LIQD Take 237 mLs by mouth 2 (two) times daily between meals. 237 mL  0  . ondansetron (ZOFRAN) 4 MG tablet Take 1 tablet (4 mg total) by mouth every 8 (eight) hours as needed for nausea or vomiting. 30 tablet 0  . oxyCODONE (OXY IR/ROXICODONE) 5 MG immediate release tablet Take 1 tablet (5 mg total) by mouth every 8 (eight) hours as needed (pain). 6 tablet 0  . OXYGEN Inhale 2-4 L into the lungs as needed (for shortness of breath/ to keep oxygen levels above 93%).    . pantoprazole (PROTONIX) 40 MG tablet Take 1 tablet (40 mg total) by mouth 2 (two) times daily before a meal. 60 tablet 0  . polyethylene glycol (MIRALAX / GLYCOLAX) packet Take 17 g by mouth daily as needed (constipation).    . furosemide (LASIX) 80 MG tablet Take 1.5 tablets (120 mg total) by mouth 2 (two) times daily. (Patient not taking: Reported on 06/18/2017) 90 tablet 6   No current facility-administered medications for this encounter.     Allergies  Allergen Reactions  . Ace Inhibitors Anaphylaxis and Swelling      Social History   Socioeconomic History  . Marital status: Divorced    Spouse name: Not on file  . Number of children: Not on file  . Years of education: Not on file  . Highest education level: Not on file    Social Needs  . Financial resource strain: Not on file  . Food insecurity - worry: Not on file  . Food insecurity - inability: Not on file  . Transportation needs - medical: Not on file  . Transportation needs - non-medical: Not on file  Occupational History  . Not on file  Tobacco Use  . Smoking status: Former Smoker    Types: Cigarettes  . Smokeless tobacco: Never Used  Substance and Sexual Activity  . Alcohol use: No  . Drug use: No  . Sexual activity: No  Other Topics Concern  . Not on file  Social History Narrative  . Not on file      Family History  Problem Relation Age of Onset  . Hypertension Mother     Vitals:   06/18/17 1207  BP: (!) 180/99  Pulse: 83  SpO2: 99%  Weight: 123 lb 6.4 oz (56 kg)     PHYSICAL EXAM: General: NAD, thin Neck: No JVD, no thyromegaly or thyroid nodule.  Lungs: Clear to auscultation bilaterally with normal respiratory effort. CV: Nondisplaced PMI.  Heart regular S1/S2, no S3/S4, 2/6 HSM apex.  No peripheral edema.  No carotid bruit.  Difficult to palpate pedal pulses.  Abdomen: Soft, nontender, no hepatosplenomegaly, no distention.  Skin: Intact without lesions or rashes.  Neurologic: Alert and oriented x 3.  Psych: Normal affect. Extremities: No clubbing or cyanosis.  HEENT: Normal.   ASSESSMENT & PLAN: 1. Chronic diastolic CHF: ECHO 08/90 EF 60-65% with severe MR. On exam today, she does not appear volume overloaded. She has been taking Lasix 160 mg bid. - Ultimately, need mitral valve fixed to keep her out of CHF.  - I am going to back off on her Lasix dose a bit, will decrease to 160 mg qam/120 mg qpm. 2. CKD Stage IV:  Had R hydronephrosis in setting of hydronephrosis and required percutaneous drain in 9/18 (removed). Renal US 04/14/17 showed no further evidence of hydronephrosis. She will need coronary angiography prior to MV repair. This is going to put her at risk for worsening renal function.       - She follows with  nephrology.  3.  HTN: BP high today but has only taken hydralazine.     - Continue hydralazine 100 mg three times a day  - Continue amlodipine 10 mg daily.  - Continue coreg 6.25 mg BID. Has not been increased due to junctional bradycardia during prior hospitalization.  - Continue doxazosin 2 mg daily.   4.  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  Noted on TEE 02/2017.  Ultimately, she needs mitral valve repair (versus Mitraclip).  She will also need cardiac cath prior which will be risky because of CKD stage IV. She has seen Dr. Roxy Manns recently. We had a discussion today about her options. In order to move forward with treating her valve, we will need to set up cardiac cath. If she does not have the valve repaired, she will likely develop progressive CHF (recent admissions probably driven by HF from MR).   - We discussed the risks/benefits of cardiac cath, she understands the concern about progression of renal disease but also knows that we need to try to fix her valve.  I will plan LHC/RHC after Christmas.  I would like to see creatinine < 3.  I will try to minimize contrast and will hold Lasix completely 2 days prior to cath. She agrees to procedure. I will check BMET today.  - Repeat echo. - Appt with Dr. Enrique Sack as she has teeth that must be removed prior to surgery.   5. Atrial fibrillation: Paroxysmal.  She is in NSR by exam today.  She is off anticoagulation after a spontaneous RP hemorrhage in 9/18.   - I will restart anticoagulation after cath, will likely use Eliquis 2.5 mg bid.  - She is off amiodarone due to junctional bradycardia in the hospital.  6. CAD: Episodes of atypical chest pain.  Per patient, has history of MI treated in [place].  As above, she will need coronary angiography prior to MV surgery.  - Continue ASA 81 and atorvastatin 40 mg daily.  - She is on Imdur 120 mg daily.   [name], MD 06/18/17

## 2017-06-18 NOTE — Patient Instructions (Signed)
Labs today (will call for abnormal results, otherwise no news is good news)  DECREASE Lasix to 160 mg (2 Tablets) in the AM and 120 mg (1.5 Tabs) in the PM.  Echocardiogram has been scheduled for you.  You have been scheduled for a Right & left Heart Cath on January 4th, please see attached instructions.  Follow up as scheduled.

## 2017-06-18 NOTE — Progress Notes (Signed)
Advanced Heart Failure Clinic Note   PCP: Dr. Dorita Fray ([facility]) HF Cardiology: Dr. Aundra Dubin  HPI: J[facility]. Jocelyn Hill is a 66 y.o. female with h/o mitral regurgitation, chronic diastolic CHF, CAD s/p MI, long standing HTN, DM2, CKD IV-V, h/o Stroke. Anxiety, depression and GERD. Patient reported that she has had 2 MIs treated at [facility] in [place] several years ago.   Presented to [facility] on 03/12/2017 with acute hypoxic respiratory failure. ECHO showed normal LV function with severe MR. Diuresed with IV lasix over 20 pounds. She was being considered for MVR by Dr Roxy Manns but due to multiple complications she was not a candidate. [facility] course was complicated by acute respiratory failure, acute renal failure, atrial fibrillation, retroperitoneal bleed, r hydronephrosis (perc tube) and E coli bacteremia. She was started on anticoagulation for atrial fibrillation but this was stopped with RP hematoma.  She converted to NSR on amiodarone but developed junctional rhythm so amiodarone was stopped. Creatinine peaked at 5.  She did not require dialysis and gradually renal function improved. Discharged to SNF without diuretics. Discharge weight was 124 pounds.   Admitted again 04/12/17-04/23/17 with respiratory failure from [facility]. She had gained 16 pounds at SNF as she was not on diuretic.  CXR with bilateral infiltrates. Placed on vanc and zosyn. Pertinent admission labs included BNP > 3000, creatinine 3.75, K 4.1, WBC 16.1, procalcitonin 2.06, and troponin 0.04. Requiring 100% high flow oxygen. Blood CX - NGTD. Diuresing with high dose IV lasix.  Nephrology consulted, renal US with no evidence of hydronephrosis (pt had percutaneous nephrostomy drain the month prior due to this). Also placed on NTG drip for HTN and CP. She was seen by Dr. Roxy Manns again and felt still to not be a MVR candidate. Discharged on Lasix 160 mg BID. Discharge weight was 114 pounds.   TTE 03/12/17 LVEF 60-65%,  Grade 2 DD, Severe MR, Severe LAE, Mild RAE, Mild/Mod TR, PA peak pressure 60 mm hg, small/mod pericardial effusion.  TEE 03/17/17 LVEF 55-60%, Trivial TR, Severe MR, mild LAE, mild mod TR. No evidence of vegetation.  She returns today for followup of CHF. She is now home from SNF and living with her daughter in [place].  She is short of breath walking about 50 yards or walking up an incline. She gets short of breath with dressing.  She has had several episodes of chest pain.  Monday, she had chest tightness while in the car, this resolved after about 20 minutes. Last night, she had an episode of chest pain lasting for 1.5 hours with radiation to her arm. She took a NTG which helped. Her chest pain has not been exertional.  BP is high today but she has only taken hydralazine.  She also reports "panic attacks" where her hands and feet will get numb and she gets short of breath (triggered by emotional stress).   ECG (10/18): NSR, old ASMI (personally reviewed)  Labs (10/18): K 3.8, creatinine 2.88  Review of Systems: All systems reviewed and negative except as per HPI.   PMH: 1. Chronic diastolic CHF:  - TTE 12/24/01 LVEF 60-65%, Grade 2 DD, Severe MR, Severe LAE, Mild RAE, Mild/Mod TR, PA peak pressure 60 mm hg, small/mod pericardial effusion. 2. CKD: stage IV.  3. Anxiety/panic attacks 4. DM 5. HTN 6. CAD: Patient reports prior MI treated at [facility].  No records available 7. Retroperitoneal hematoma: With anticoagulation in 9/18.  8. Hydronephrosis due to RP hematoma: Resolved.  9. GERD  10. Hyperlipidemia 11. H/o CVA 12. 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  - TEE 03/17/17 LVEF 55-60%, Trivial TR, Severe MR, mild LAE, mild mod TR. No evidence of vegetation. 13. Atrial fibrillation: Paroxysmal.  Noted in 9/18, not anticoagulated currently due to RP hematoma.  - Junctional rhythm on amiodarone, amiodarone stopped.   Current Outpatient Medications  Medication  Sig Dispense Refill  . acetaminophen (TYLENOL) 325 MG tablet Take 2 tablets (650 mg total) by mouth every 6 (six) hours as needed for mild pain, fever or headache. 30 tablet 0  . amLODipine (NORVASC) 10 MG tablet Take 1 tablet (10 mg total) by mouth daily. 30 tablet 0  . aspirin EC 81 MG EC tablet Take 1 tablet (81 mg total) by mouth daily. 30 tablet 0  . atorvastatin (LIPITOR) 40 MG tablet Take 1 tablet (40 mg total) by mouth daily. 30 tablet 0  . b complex vitamins tablet Take 1 tablet by mouth daily.    Marland Kitchen buPROPion (WELLBUTRIN XL) 300 MG 24 hr tablet Take 1 tablet (300 mg total) by mouth daily. 30 tablet 0  . busPIRone (BUSPAR) 15 MG tablet Take 1 tablet (15 mg total) by mouth 2 (two) times daily. 60 tablet 0  . carvedilol (COREG) 6.25 MG tablet Take 1 tablet (6.25 mg total) by mouth 2 (two) times daily with a meal. 60 tablet 0  . cholecalciferol (VITAMIN D) 1000 units tablet Take 1,000 Units by mouth daily.    . Darbepoetin Alfa (ARANESP) 40 MCG/0.4ML SOSY injection Inject 0.4 mLs (40 mcg total) into the skin every Tuesday at 6 PM. 8.4 mL 0  . docusate sodium (COLACE) 100 MG capsule Take 1 capsule (100 mg total) by mouth 2 (two) times daily. 10 capsule 0  . doxazosin (CARDURA) 2 MG tablet Take 1 tablet (2 mg total) by mouth daily. 30 tablet 0  . DULoxetine (CYMBALTA) 30 MG capsule Take 1 capsule (30 mg total) by mouth daily. 30 capsule 0  . ferrous sulfate 325 (65 FE) MG tablet Take 1 tablet (325 mg total) by mouth 2 (two) times daily with a meal. 60 tablet 0  . furosemide (LASIX) 80 MG tablet Take 160 mg (2 Tabs) in the AM and 120 mg (1.5 Tabs) In the PM. 105 tablet 3  . hydrALAZINE (APRESOLINE) 100 MG tablet Take 1 tablet (100 mg total) by mouth every 8 (eight) hours. 90 tablet 0  . insulin aspart (NOVOLOG) 100 UNIT/ML injection Inject 0-20 Units into the skin 3 (three) times daily with meals. 10 mL 11  . isosorbide mononitrate (IMDUR) 120 MG 24 hr tablet Take 1 tablet (120 mg total) by mouth  daily. 30 tablet 0  . levothyroxine (SYNTHROID, LEVOTHROID) 112 MCG tablet Take 1 tablet (112 mcg total) by mouth daily before breakfast. 30 tablet 0  . lidocaine (LIDODERM) 5 % Place 1 patch onto the skin daily. Remove & Discard patch within 12 hours or as directed by MD 30 patch 0  . magnesium gluconate (MAGONATE) 500 MG tablet Take 0.5 tablets (250 mg total) by mouth daily. 30 tablet 0  . montelukast (SINGULAIR) 10 MG tablet Take 1 tablet (10 mg total) by mouth daily. 30 tablet 0  . nitroGLYCERIN (NITROSTAT) 0.4 MG SL tablet Place 1 tablet (0.4 mg total) under the tongue every 5 (five) minutes as needed for chest pain. 30 tablet 0  . Nutritional Supplements (FEEDING SUPPLEMENT, NEPRO CARB STEADY,) LIQD Take 237 mLs by mouth 2 (two) times daily between meals. 237 mL  0  . ondansetron (ZOFRAN) 4 MG tablet Take 1 tablet (4 mg total) by mouth every 8 (eight) hours as needed for nausea or vomiting. 30 tablet 0  . oxyCODONE (OXY IR/ROXICODONE) 5 MG immediate release tablet Take 1 tablet (5 mg total) by mouth every 8 (eight) hours as needed (pain). 6 tablet 0  . OXYGEN Inhale 2-4 L into the lungs as needed (for shortness of breath/ to keep oxygen levels above 93%).    . pantoprazole (PROTONIX) 40 MG tablet Take 1 tablet (40 mg total) by mouth 2 (two) times daily before a meal. 60 tablet 0  . polyethylene glycol (MIRALAX / GLYCOLAX) packet Take 17 g by mouth daily as needed (constipation).    . furosemide (LASIX) 80 MG tablet Take 1.5 tablets (120 mg total) by mouth 2 (two) times daily. (Patient not taking: Reported on 06/18/2017) 90 tablet 6   No current facility-administered medications for this encounter.     Allergies  Allergen Reactions  . Ace Inhibitors Anaphylaxis and Swelling      Social History   Socioeconomic History  . Marital status: Divorced    Spouse name: Not on file  . Number of children: Not on file  . Years of education: Not on file  . Highest education level: Not on file    Social Needs  . Financial resource strain: Not on file  . Food insecurity - worry: Not on file  . Food insecurity - inability: Not on file  . Transportation needs - medical: Not on file  . Transportation needs - non-medical: Not on file  Occupational History  . Not on file  Tobacco Use  . Smoking status: Former Smoker    Types: Cigarettes  . Smokeless tobacco: Never Used  Substance and Sexual Activity  . Alcohol use: No  . Drug use: No  . Sexual activity: No  Other Topics Concern  . Not on file  Social History Narrative  . Not on file      Family History  Problem Relation Age of Onset  . Hypertension Mother     Vitals:   06/18/17 1207  BP: (!) 180/99  Pulse: 83  SpO2: 99%  Weight: 123 lb 6.4 oz (56 kg)     PHYSICAL EXAM: General: NAD, thin Neck: No JVD, no thyromegaly or thyroid nodule.  Lungs: Clear to auscultation bilaterally with normal respiratory effort. CV: Nondisplaced PMI.  Heart regular S1/S2, no S3/S4, 2/6 HSM apex.  No peripheral edema.  No carotid bruit.  Difficult to palpate pedal pulses.  Abdomen: Soft, nontender, no hepatosplenomegaly, no distention.  Skin: Intact without lesions or rashes.  Neurologic: Alert and oriented x 3.  Psych: Normal affect. Extremities: No clubbing or cyanosis.  HEENT: Normal.   ASSESSMENT & PLAN: 1. Chronic diastolic CHF: ECHO 0/6269 EF 60-65% with severe MR. On exam today, she does not appear volume overloaded. She has been taking Lasix 160 mg bid. - Ultimately, need mitral valve fixed to keep her out of CHF.  - I am going to back off on her Lasix dose a bit, will decrease to 160 mg qam/120 mg qpm. 2. CKD Stage IV:  Had R hydronephrosis in setting of hydronephrosis and required percutaneous drain in 9/18 (removed). Renal US 04/14/17 showed no further evidence of hydronephrosis. She will need coronary angiography prior to MV repair. This is going to put her at risk for worsening renal function.       - She follows with  nephrology.  3.  HTN: BP high today but has only taken hydralazine.     - Continue hydralazine 100 mg three times a day  - Continue amlodipine 10 mg daily.  - Continue coreg 6.25 mg BID. Has not been increased due to junctional bradycardia during prior hospitalization.  - Continue doxazosin 2 mg daily.   4.  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  Noted on TEE 02/2017.  Ultimately, she needs mitral valve repair (versus Mitraclip).  She will also need cardiac cath prior which will be risky because of CKD stage IV. She has seen Dr. Roxy Manns recently. We had a discussion today about her options. In order to move forward with treating her valve, we will need to set up cardiac cath. If she does not have the valve repaired, she will likely develop progressive CHF (recent admissions probably driven by HF from MR).   - We discussed the risks/benefits of cardiac cath, she understands the concern about progression of renal disease but also knows that we need to try to fix her valve.  I will plan LHC/RHC after Christmas.  I would like to see creatinine < 3.  I will try to minimize contrast and will hold Lasix completely 2 days prior to cath. She agrees to procedure. I will check BMET today.  - Repeat echo. - Appt with Dr. Enrique Sack as she has teeth that must be removed prior to surgery.   5. Atrial fibrillation: Paroxysmal.  She is in NSR by exam today.  She is off anticoagulation after a spontaneous RP hemorrhage in 9/18.   - I will restart anticoagulation after cath, will likely use Eliquis 2.5 mg bid.  - She is off amiodarone due to junctional bradycardia in the [facility].  6. CAD: Episodes of atypical chest pain.  Per patient, has history of MI treated in [place].  As above, she will need coronary angiography prior to MV surgery.  - Continue ASA 81 and atorvastatin 40 mg daily.  - She is on Imdur 120 mg daily.   [name], MD 06/18/17

## 2017-06-25 ENCOUNTER — Telehealth (HOSPITAL_COMMUNITY): Payer: Self-pay | Admitting: *Deleted

## 2017-06-25 NOTE — Telephone Encounter (Signed)
CPT Code 50037  Description: L/R [place] Authorization Number: [id number]  Review Date: 06/25/2017 8:16:28 AM  Expiration Date: 08/09/2017

## 2017-06-30 DIAGNOSIS — Z950 Presence of cardiac pacemaker: Secondary | ICD-10-CM

## 2017-06-30 HISTORY — DX: Presence of cardiac pacemaker: Z95.0

## 2017-07-01 NOTE — Progress Notes (Signed)
Attempted to reach patient by phone. No answer left detailed VM and asked patient to return my call to let me know she is aware and understands medication change.

## 2017-07-03 ENCOUNTER — Ambulatory Visit (HOSPITAL_COMMUNITY)
Admission: RE | Admit: 2017-07-03 | Discharge: 2017-07-03 | Disposition: A | Discharge status: Home / Self Care | Payer: Medicare Other | Source: Ambulatory Visit | Attending: Cardiology | Admitting: Cardiology

## 2017-07-03 ENCOUNTER — Other Ambulatory Visit (HOSPITAL_COMMUNITY): Payer: Self-pay

## 2017-07-03 ENCOUNTER — Ambulatory Visit (HOSPITAL_COMMUNITY)
Admission: RE | Disposition: A | Discharge status: Home / Self Care | Payer: Self-pay | Source: Ambulatory Visit | Attending: Cardiology

## 2017-07-03 DIAGNOSIS — Z7982 Long term (current) use of aspirin: Secondary | ICD-10-CM | POA: Diagnosis not present

## 2017-07-03 DIAGNOSIS — I480 Paroxysmal atrial fibrillation: Secondary | ICD-10-CM | POA: Diagnosis not present

## 2017-07-03 DIAGNOSIS — I252 Old myocardial infarction: Secondary | ICD-10-CM | POA: Insufficient documentation

## 2017-07-03 DIAGNOSIS — I340 Nonrheumatic mitral (valve) insufficiency: Secondary | ICD-10-CM | POA: Diagnosis not present

## 2017-07-03 DIAGNOSIS — Z87891 Personal history of nicotine dependence: Secondary | ICD-10-CM | POA: Insufficient documentation

## 2017-07-03 DIAGNOSIS — K219 Gastro-esophageal reflux disease without esophagitis: Secondary | ICD-10-CM | POA: Diagnosis not present

## 2017-07-03 DIAGNOSIS — I130 Hypertensive heart and chronic kidney disease with heart failure and stage 1 through stage 4 chronic kidney disease, or unspecified chronic kidney disease: Secondary | ICD-10-CM | POA: Diagnosis present

## 2017-07-03 DIAGNOSIS — I2510 Atherosclerotic heart disease of native coronary artery without angina pectoris: Secondary | ICD-10-CM | POA: Insufficient documentation

## 2017-07-03 DIAGNOSIS — I5032 Chronic diastolic (congestive) heart failure: Secondary | ICD-10-CM | POA: Diagnosis not present

## 2017-07-03 DIAGNOSIS — Z79899 Other long term (current) drug therapy: Secondary | ICD-10-CM | POA: Diagnosis not present

## 2017-07-03 DIAGNOSIS — E1122 Type 2 diabetes mellitus with diabetic chronic kidney disease: Secondary | ICD-10-CM | POA: Insufficient documentation

## 2017-07-03 DIAGNOSIS — E785 Hyperlipidemia, unspecified: Secondary | ICD-10-CM | POA: Diagnosis not present

## 2017-07-03 DIAGNOSIS — F410 Panic disorder [episodic paroxysmal anxiety] without agoraphobia: Secondary | ICD-10-CM | POA: Diagnosis not present

## 2017-07-03 DIAGNOSIS — N184 Chronic kidney disease, stage 4 (severe): Secondary | ICD-10-CM | POA: Diagnosis not present

## 2017-07-03 DIAGNOSIS — Z794 Long term (current) use of insulin: Secondary | ICD-10-CM | POA: Insufficient documentation

## 2017-07-03 DIAGNOSIS — Z8673 Personal history of transient ischemic attack (TIA), and cerebral infarction without residual deficits: Secondary | ICD-10-CM | POA: Insufficient documentation

## 2017-07-03 HISTORY — PX: RIGHT/LEFT HEART CATH AND CORONARY ANGIOGRAPHY: CATH118266

## 2017-07-03 LAB — BASIC METABOLIC PANEL
Anion gap: 11 (ref 5–15)
BUN: 31 mg/dL — ABNORMAL HIGH (ref 6–20)
CO2: 23 mmol/L (ref 22–32)
Calcium: 9.4 mg/dL (ref 8.9–10.3)
Chloride: 104 mmol/L (ref 101–111)
Creatinine, Ser: 3.11 mg/dL — ABNORMAL HIGH (ref 0.44–1.00)
GFR calc Af Amer: 17 mL/min — ABNORMAL LOW (ref >60)
GFR calc non Af Amer: 15 mL/min — ABNORMAL LOW (ref >60)
Glucose, Bld: 134 mg/dL — ABNORMAL HIGH (ref 65–99)
Potassium: 4 mmol/L (ref 3.5–5.1)
Sodium: 138 mmol/L (ref 135–145)

## 2017-07-03 LAB — POCT I-STAT 3, VENOUS BLOOD GAS (G3P V)
Acid-base deficit: 3 mmol/L — ABNORMAL HIGH (ref 0.0–2.0)
Acid-base deficit: 3 mmol/L — ABNORMAL HIGH (ref 0.0–2.0)
Bicarbonate: 23.3 mmol/L (ref 20.0–28.0)
Bicarbonate: 24.1 mmol/L (ref 20.0–28.0)
O2 Saturation: 71 %
O2 Saturation: 75 %
TCO2: 25 mmol/L (ref 22–32)
TCO2: 26 mmol/L (ref 22–32)
pCO2, Ven: 46.8 mmHg (ref 44.0–60.0)
pCO2, Ven: 49.4 mmHg (ref 44.0–60.0)
pH, Ven: 7.296 (ref 7.250–7.430)
pH, Ven: 7.305 (ref 7.250–7.430)
pO2, Ven: 42 mmHg (ref 32.0–45.0)
pO2, Ven: 44 mmHg (ref 32.0–45.0)

## 2017-07-03 LAB — CBC
HCT: 37.2 % (ref 36.0–46.0)
Hemoglobin: 12.6 g/dL (ref 12.0–15.0)
MCH: 25.4 pg — ABNORMAL LOW (ref 26.0–34.0)
MCHC: 33.9 g/dL (ref 30.0–36.0)
MCV: 75 fL — ABNORMAL LOW (ref 78.0–100.0)
Platelets: 277 10*3/uL (ref 150–400)
RBC: 4.96 MIL/uL (ref 3.87–5.11)
RDW: 14.9 % (ref 11.5–15.5)
WBC: 9.5 10*3/uL (ref 4.0–10.5)

## 2017-07-03 LAB — PROTIME-INR
INR: 0.9
Prothrombin Time: 12 seconds (ref 11.4–15.2)

## 2017-07-03 LAB — GLUCOSE, CAPILLARY
Glucose-Capillary: 150 mg/dL — ABNORMAL HIGH (ref 65–99)
Glucose-Capillary: 140 mg/dL — ABNORMAL HIGH (ref 65–99)

## 2017-07-03 SURGERY — RIGHT/LEFT HEART CATH AND CORONARY ANGIOGRAPHY
Anesthesia: LOCAL

## 2017-07-03 MED ORDER — LIDOCAINE HCL (PF) 1 % IJ SOLN
INTRAMUSCULAR | Status: AC
  Filled 2017-07-03: qty 30

## 2017-07-03 MED ORDER — ONDANSETRON HCL 4 MG/2ML IJ SOLN: 4.0000 mg | Freq: Four times a day (QID) | INTRAMUSCULAR | Status: DC | PRN

## 2017-07-03 MED ORDER — IOPAMIDOL (ISOVUE-370) INJECTION 76%
INTRAVENOUS | Status: AC
  Filled 2017-07-03: qty 100

## 2017-07-03 MED ORDER — SODIUM CHLORIDE 0.9% FLUSH: 3.0000 mL | Freq: Two times a day (BID) | INTRAVENOUS | Status: DC

## 2017-07-03 MED ORDER — VERAPAMIL HCL 2.5 MG/ML IV SOLN
INTRAVENOUS | Status: DC | PRN
  Administered 2017-07-03: 10 mL via INTRA_ARTERIAL

## 2017-07-03 MED ORDER — FENTANYL CITRATE (PF) 100 MCG/2ML IJ SOLN
INTRAMUSCULAR | Status: AC
  Filled 2017-07-03: qty 2

## 2017-07-03 MED ORDER — HEPARIN SODIUM (PORCINE) 1000 UNIT/ML IJ SOLN
INTRAMUSCULAR | Status: AC
  Filled 2017-07-03: qty 1

## 2017-07-03 MED ORDER — SODIUM CHLORIDE 0.9 % IV SOLN: INTRAVENOUS | Status: AC

## 2017-07-03 MED ORDER — SODIUM CHLORIDE 0.9 % IV SOLN
INTRAVENOUS | Status: DC
  Administered 2017-07-03: 11:00:00 via INTRAVENOUS

## 2017-07-03 MED ORDER — SODIUM CHLORIDE 0.9 % IV BOLUS (SEPSIS)
250.0000 mL | Freq: Once | INTRAVENOUS | Status: AC
  Administered 2017-07-03: 250 mL via INTRAVENOUS

## 2017-07-03 MED ORDER — DIAZEPAM 5 MG PO TABS
5.0000 mg | ORAL_TABLET | Freq: Once | ORAL | Status: AC
  Administered 2017-07-03: 5 mg via ORAL

## 2017-07-03 MED ORDER — NITROGLYCERIN 0.4 MG SL SUBL
SUBLINGUAL_TABLET | SUBLINGUAL | Status: AC
  Administered 2017-07-03: 0.4 mg
  Filled 2017-07-03: qty 1

## 2017-07-03 MED ORDER — SODIUM CHLORIDE 0.9% FLUSH: 3.0000 mL | INTRAVENOUS | Status: DC | PRN

## 2017-07-03 MED ORDER — ASPIRIN 81 MG PO CHEW: 81.0000 mg | CHEWABLE_TABLET | ORAL | Status: DC

## 2017-07-03 MED ORDER — FENTANYL CITRATE (PF) 100 MCG/2ML IJ SOLN
INTRAMUSCULAR | Status: DC | PRN
  Administered 2017-07-03: 25 ug via INTRAVENOUS

## 2017-07-03 MED ORDER — HEPARIN (PORCINE) IN NACL 2-0.9 UNIT/ML-% IJ SOLN
INTRAMUSCULAR | Status: AC | PRN
  Administered 2017-07-03: 1000 mL

## 2017-07-03 MED ORDER — ACETAMINOPHEN 325 MG PO TABS: 650.0000 mg | ORAL_TABLET | ORAL | Status: DC | PRN

## 2017-07-03 MED ORDER — HEPARIN (PORCINE) IN NACL 2-0.9 UNIT/ML-% IJ SOLN
INTRAMUSCULAR | Status: AC
  Filled 2017-07-03: qty 1000

## 2017-07-03 MED ORDER — VERAPAMIL HCL 2.5 MG/ML IV SOLN
INTRAVENOUS | Status: AC
  Filled 2017-07-03: qty 2

## 2017-07-03 MED ORDER — IOPAMIDOL (ISOVUE-370) INJECTION 76%
INTRAVENOUS | Status: DC | PRN
  Administered 2017-07-03: 50 mL via INTRA_ARTERIAL

## 2017-07-03 MED ORDER — SODIUM CHLORIDE 0.9 % IV SOLN: 250.0000 mL | INTRAVENOUS | Status: DC | PRN

## 2017-07-03 MED ORDER — HEPARIN SODIUM (PORCINE) 1000 UNIT/ML IJ SOLN
INTRAMUSCULAR | Status: DC | PRN
  Administered 2017-07-03: 3000 [IU] via INTRAVENOUS

## 2017-07-03 MED ORDER — MIDAZOLAM HCL 2 MG/2ML IJ SOLN
INTRAMUSCULAR | Status: AC
  Filled 2017-07-03: qty 2

## 2017-07-03 MED ORDER — MIDAZOLAM HCL 2 MG/2ML IJ SOLN
INTRAMUSCULAR | Status: DC | PRN
  Administered 2017-07-03: 1 mg via INTRAVENOUS

## 2017-07-03 MED ORDER — DIAZEPAM 5 MG PO TABS
ORAL_TABLET | ORAL | Status: AC
  Filled 2017-07-03: qty 1

## 2017-07-03 MED ORDER — LIDOCAINE HCL (PF) 1 % IJ SOLN
INTRAMUSCULAR | Status: DC | PRN
  Administered 2017-07-03: 2 mL

## 2017-07-03 SURGICAL SUPPLY — 13 items
CATH 5FR JL3.5 JR4 ANG PIG MP (CATHETERS) ×2 IMPLANT
CATH BALLN WEDGE 5F 110CM (CATHETERS) ×2 IMPLANT
CATH SWAN GANZ 7F STRAIGHT (CATHETERS) ×2 IMPLANT
DEVICE RAD COMP TR BAND LRG (VASCULAR PRODUCTS) ×2 IMPLANT
GLIDESHEATH SLEND SS 6F .021 (SHEATH) ×2 IMPLANT
GUIDEWIRE INQWIRE 1.5J.035X260 (WIRE) ×1 IMPLANT
INQWIRE 1.5J .035X260CM (WIRE) ×2
KIT HEART LEFT (KITS) ×2 IMPLANT
PACK CARDIAC CATHETERIZATION (CUSTOM PROCEDURE TRAY) ×2 IMPLANT
SHEATH GLIDE SLENDER 4/5FR (SHEATH) ×2 IMPLANT
SHEATH PINNACLE 7F 10CM (SHEATH) ×2 IMPLANT
TRANSDUCER W/STOPCOCK (MISCELLANEOUS) ×2 IMPLANT
TUBING CIL FLEX 10 FLL-RA (TUBING) ×4 IMPLANT

## 2017-07-03 NOTE — Progress Notes (Addendum)
Anderson Malta O'Neil,RN notified of lab results and she notified Dr, no new orders

## 2017-07-03 NOTE — Interval H&P Note (Signed)
History and Physical Interval Note:  07/03/2017 12:44 PM  [facility]  has presented today for surgery, with the diagnosis of CHF  The various methods of treatment have been discussed with the patient and family. After consideration of risks, benefits and other options for treatment, the patient has consented to  Procedure(s): RIGHT/LEFT HEART CATH AND CORONARY ANGIOGRAPHY (N/A) as a surgical intervention .  The patient's history has been reviewed, patient examined, no change in status, stable for surgery.  I have reviewed the patient's chart and labs.  Questions were answered to the patient's satisfaction.     Zayed Griffie [employer]

## 2017-07-03 NOTE — Progress Notes (Signed)
Assumed care of pt from Jocelyn Davis, RN. Assessment documented 

## 2017-07-03 NOTE — Discharge Instructions (Signed)
Femoral Site Care °Refer to this sheet in the next few weeks. These instructions provide you with information about caring for yourself after your procedure. Your health care provider may also give you more specific instructions. Your treatment has been planned according to current medical practices, but problems sometimes occur. Call your health care provider if you have any problems or questions after your procedure. °What can I expect after the procedure? °After your procedure, it is typical to have the following: °· Bruising at the site that usually fades within 1-2 weeks. °· Blood collecting in the tissue (hematoma) that may be painful to the touch. It should usually decrease in size and tenderness within 1-2 weeks. ° °Follow these instructions at home: °· Take medicines only as directed by your health care provider. °· You may shower 24-48 hours after the procedure or as directed by your health care provider. Remove the bandage (dressing) and gently wash the site with plain soap and water. Pat the area dry with a clean towel. Do not rub the site, because this may cause bleeding. °· Do not take baths, swim, or use a hot tub until your health care provider approves. °· Check your insertion site every day for redness, swelling, or drainage. °· Do not apply powder or lotion to the site. °· Limit use of stairs to twice a day for the first 2-3 days or as directed by your health care provider. °· Do not squat for the first 2-3 days or as directed by your health care provider. °· Do not lift over 10 lb (4.5 kg) for 5 days after your procedure or as directed by your health care provider. °· Ask your health care provider when it is okay to: °? Return to work or school. °? Resume usual physical activities or sports. °? Resume sexual activity. °· Do not drive home if you are discharged the same day as the procedure. Have someone else drive you. °· You may drive 24 hours after the procedure unless otherwise instructed by  your health care provider. °· Do not operate machinery or power tools for 24 hours after the procedure or as directed by your health care provider. °· If your procedure was done as an outpatient procedure, which means that you went home the same day as your procedure, a responsible adult should be with you for the first 24 hours after you arrive home. °· Keep all follow-up visits as directed by your health care provider. This is important. °Contact a health care provider if: °· You have a fever. °· You have chills. °· You have increased bleeding from the site. Hold pressure on the site. °Get help right away if: °· You have unusual pain at the site. °· You have redness, warmth, or swelling at the site. °· You have drainage (other than a small amount of blood on the dressing) from the site. °· The site is bleeding, and the bleeding does not stop after 30 minutes of holding steady pressure on the site. °· Your leg or foot becomes pale, cool, tingly, or numb. °This information is not intended to replace advice given to you by your health care provider. Make sure you discuss any questions you have with your health care provider. °Document Released: 02/17/2014 Document Revised: 11/22/2015 Document Reviewed: 01/03/2014 °Elsevier Interactive Patient Education © 2018 Elsevier Inc. ° °Radial Site Care °Refer to this sheet in the next few weeks. These instructions provide you with information about caring for yourself after your procedure. Your health   health care provider may also give you more specific instructions. Your treatment has been planned according to current medical practices, but problems sometimes occur. Call your health care provider if you have any problems or questions after your procedure. What can I expect after the procedure? After your procedure, it is typical to have the following:  Bruising at the radial site that usually fades within 1-2 weeks.  Blood collecting in the tissue (hematoma) that may be  painful to the touch. It should usually decrease in size and tenderness within 1-2 weeks.  Follow these instructions at home:  Take medicines only as directed by your health care provider.  You may shower 24-48 hours after the procedure or as directed by your health care provider. Remove the bandage (dressing) and gently wash the site with plain soap and water. Pat the area dry with a clean towel. Do not rub the site, because this may cause bleeding.  Do not take baths, swim, or use a hot tub until your health care provider approves.  Check your insertion site every day for redness, swelling, or drainage.  Do not apply powder or lotion to the site.  Do not flex or bend the affected arm for 24 hours or as directed by your health care provider.  Do not push or pull heavy objects with the affected arm for 24 hours or as directed by your health care provider.  Do not lift over 10 lb (4.5 kg) for 5 days after your procedure or as directed by your health care provider.  Ask your health care provider when it is okay to: ? Return to work or school. ? Resume usual physical activities or sports. ? Resume sexual activity.  Do not drive home if you are discharged the same day as the procedure. Have someone else drive you.  You may drive 24 hours after the procedure unless otherwise instructed by your health care provider.  Do not operate machinery or power tools for 24 hours after the procedure.  If your procedure was done as an outpatient procedure, which means that you went home the same day as your procedure, a responsible adult should be with you for the first 24 hours after you arrive home.  Keep all follow-up visits as directed by your health care provider. This is important. Contact a health care provider if:  You have a fever.  You have chills.  You have increased bleeding from the radial site. Hold pressure on the site. Get help right away if:  You have unusual pain at the  radial site.  You have redness, warmth, or swelling at the radial site.  You have drainage (other than a small amount of blood on the dressing) from the radial site.  The radial site is bleeding, and the bleeding does not stop after 30 minutes of holding steady pressure on the site.  Your arm or hand becomes pale, cool, tingly, or numb. This information is not intended to replace advice given to you by your health care provider. Make sure you discuss any questions you have with your health care provider. Document Released: 07/19/2010 Document Revised: 11/22/2015 Document Reviewed: 01/02/2014 Elsevier Interactive Patient Education  2018 [employer].   Moderate Conscious Sedation, Adult, Care After These instructions provide you with information about caring for yourself after your procedure. Your health care provider may also give you more specific instructions. Your treatment has been planned according to current medical practices, but problems sometimes occur. Call your health care provider  if you have any problems or questions after your procedure. What can I expect after the procedure? After your procedure, it is common:  To feel sleepy for several hours.  To feel clumsy and have poor balance for several hours.  To have poor judgment for several hours.  To vomit if you eat too soon.  Follow these instructions at home: For at least 24 hours after the procedure:   Do not: ? Participate in activities where you could fall or become injured. ? Drive. ? Use heavy machinery. ? Drink alcohol. ? Take sleeping pills or medicines that cause drowsiness. ? Make important decisions or sign legal documents. ? Take care of children on your own.  Rest. Eating and drinking  Follow the diet recommended by your health care provider.  If you vomit: ? Drink water, juice, or soup when you can drink without vomiting. ? Make sure you have little or no nausea before eating solid  foods. General instructions  Have a responsible adult stay with you until you are awake and alert.  Take over-the-counter and prescription medicines only as told by your health care provider.  If you smoke, do not smoke without supervision.  Keep all follow-up visits as told by your health care provider. This is important. Contact a health care provider if:  You keep feeling nauseous or you keep vomiting.  You feel light-headed.  You develop a rash.  You have a fever. Get help right away if:  You have trouble breathing. This information is not intended to replace advice given to you by your health care provider. Make sure you discuss any questions you have with your health care provider. Document Released: 04/06/2013 Document Revised: 11/19/2015 Document Reviewed: 10/06/2015 Elsevier Interactive Patient Education  [employer].

## 2017-07-03 NOTE — Progress Notes (Signed)
Site area: rt fv sheath Site Prior to Removal:  Level 0 Pressure Applied For: 15 minutes Manual:   yes Patient Status During Pull:  stable Post Pull Site:  Level  0 Post Pull Instructions Given:  yes Post Pull Pulses Present: palpable rt dp Dressing Applied:  Gauze and tegaderm Bedrest begins @ 1400 Comments:

## 2017-07-06 ENCOUNTER — Encounter (HOSPITAL_COMMUNITY): Payer: Self-pay | Admitting: Cardiology

## 2017-07-06 MED FILL — Lidocaine HCl Local Preservative Free (PF) Inj 1%: INTRAMUSCULAR | Qty: 30 | Status: AC

## 2017-07-13 ENCOUNTER — Encounter (HOSPITAL_COMMUNITY): Payer: Self-pay | Admitting: *Deleted

## 2017-07-14 ENCOUNTER — Other Ambulatory Visit (HOSPITAL_COMMUNITY): Payer: Self-pay

## 2017-07-17 ENCOUNTER — Ambulatory Visit (HOSPITAL_COMMUNITY)
Admission: RE | Admit: 2017-07-17 | Discharge: 2017-07-17 | Disposition: A | Discharge status: Home / Self Care | Payer: Medicare Other | Source: Ambulatory Visit | Attending: Internal Medicine | Admitting: Internal Medicine

## 2017-07-17 DIAGNOSIS — I5032 Chronic diastolic (congestive) heart failure: Secondary | ICD-10-CM | POA: Insufficient documentation

## 2017-07-17 LAB — BASIC METABOLIC PANEL
Anion gap: 13 (ref 5–15)
BUN: 35 mg/dL — ABNORMAL HIGH (ref 6–20)
CO2: 26 mmol/L (ref 22–32)
Calcium: 9.7 mg/dL (ref 8.9–10.3)
Chloride: 102 mmol/L (ref 101–111)
Creatinine, Ser: 3.6 mg/dL — ABNORMAL HIGH (ref 0.44–1.00)
GFR calc Af Amer: 14 mL/min — ABNORMAL LOW (ref >60)
GFR calc non Af Amer: 12 mL/min — ABNORMAL LOW (ref >60)
Glucose, Bld: 133 mg/dL — ABNORMAL HIGH (ref 65–99)
Potassium: 3.2 mmol/L — ABNORMAL LOW (ref 3.5–5.1)
Sodium: 141 mmol/L (ref 135–145)

## 2017-07-23 ENCOUNTER — Other Ambulatory Visit (HOSPITAL_COMMUNITY): Payer: Self-pay | Admitting: Dentistry

## 2017-07-23 ENCOUNTER — Telehealth (HOSPITAL_COMMUNITY): Payer: Self-pay | Admitting: *Deleted

## 2017-07-23 MED ORDER — POTASSIUM CHLORIDE ER 20 MEQ PO TBCR: 20.0000 meq | EXTENDED_RELEASE_TABLET | Freq: Every day | ORAL | 3 refills | Status: DC

## 2017-07-23 NOTE — Telephone Encounter (Signed)
Result Notes for Basic metabolic panel   Notes recorded by Darron Doom, RN on 07/23/2017 at 1:48 PM EST Patient called back and she is agreeable with plan. She already has an appointment with Dr. Aundra Dubin next Friday Feb 1st, we will do labs at that appointment. Medication sent to pharmacy. ------  Notes recorded by Shirley Muscat, RN on 07/23/2017 at 11:21 AM EST Left VM  ------  Notes recorded by Larey Dresser, MD on 07/19/2017 at 9:50 PM EST Add KCl 20 mEq daily. Repeat BMET in 1 week

## 2017-07-29 ENCOUNTER — Telehealth (HOSPITAL_COMMUNITY): Payer: Self-pay | Admitting: Dentistry

## 2017-07-29 NOTE — Telephone Encounter (Signed)
07/29/2017  Patient:            [facility]. Ishii Date of Birth:  16-Feb-1951 MRN:                [id number]   I called patient at home to schedule a dental consultation appointment for the patient. I left a message on machine to have patient call back to reschedule the appointment as soon as possible. Patient is aware that dental consultation appointment and treatment needs to be provided prior to anticipated heart valve surgery.   [name], DDS

## 2017-07-31 ENCOUNTER — Ambulatory Visit (HOSPITAL_COMMUNITY): Payer: Medicare Other

## 2017-07-31 ENCOUNTER — Encounter (HOSPITAL_COMMUNITY): Payer: Self-pay | Admitting: Cardiology

## 2017-08-07 ENCOUNTER — Ambulatory Visit (HOSPITAL_COMMUNITY): Payer: Self-pay | Admitting: Dentistry

## 2017-08-07 ENCOUNTER — Encounter (HOSPITAL_COMMUNITY): Payer: Self-pay | Admitting: Dentistry

## 2017-08-07 VITALS — BP 175/80 | HR 67 | Temp 98.2°F

## 2017-08-07 DIAGNOSIS — K029 Dental caries, unspecified: Secondary | ICD-10-CM

## 2017-08-07 DIAGNOSIS — K0530 Chronic periodontitis, unspecified: Secondary | ICD-10-CM

## 2017-08-07 DIAGNOSIS — K036 Deposits [accretions] on teeth: Secondary | ICD-10-CM

## 2017-08-07 DIAGNOSIS — K0889 Other specified disorders of teeth and supporting structures: Secondary | ICD-10-CM

## 2017-08-07 DIAGNOSIS — Z01818 Encounter for other preprocedural examination: Secondary | ICD-10-CM

## 2017-08-07 DIAGNOSIS — K08409 Partial loss of teeth, unspecified cause, unspecified class: Secondary | ICD-10-CM

## 2017-08-07 DIAGNOSIS — K045 Chronic apical periodontitis: Secondary | ICD-10-CM

## 2017-08-07 DIAGNOSIS — I340 Nonrheumatic mitral (valve) insufficiency: Secondary | ICD-10-CM | POA: Diagnosis not present

## 2017-08-07 DIAGNOSIS — K083 Retained dental root: Secondary | ICD-10-CM

## 2017-08-07 DIAGNOSIS — K06020 Generalized gingival recession, unspecified: Secondary | ICD-10-CM

## 2017-08-07 DIAGNOSIS — M264 Malocclusion, unspecified: Secondary | ICD-10-CM

## 2017-08-07 NOTE — Progress Notes (Signed)
Periodic oral examination  Date of examination:  08/07/2017 Patient Name:   Jocelyn Hill. Mcadams Date of Birth:   [birth year] Medical Record Number: [id number]  VITALS: BP (!) 175/80 (BP Location: Right Arm)   Pulse 67   Temp 98.2 F (36.8 C)   CHIEF COMPLAINT: Patient initially referred by Dr. Roxy Manns for a Dental Consultation on 03/30/2017.  HPI: Tennille Montelongo. Yontz is a 67 year old female initially seen for a dental consultation on 03/30/2017.  Patient was diagnosed with severe mitral regurgitation. Patient with anticipated heart valve surgery.Patient was seen then for pre-heart valve surgery dental protocol examination to rule out dental infection that may affect the patient's systemic health and Anticipated heart valve surgery.  Patient felt to be too ill at that time to proceed with dental procedures in the operating with general anesthesia. Dental treatment was then deferred at that time. Patient now presents for periodic oral examination, radiographs, and discussion of dental treatment needs.  The patient currently is complaining of upper right and upper left toothache symptoms. Patient describes dull, aching involving the upper right and upper left root segments. The pain reached an intensity of 10 out of 10 but is currently 3 out of 10.  This occurs in an intermittent basis. The pain is spontaneous at times. Patient last saw a dentist in 2018 to have a crown re-cemented. This is since fallen off. This was with a dentist in [place], [place]. The patient denies having regular dental care. Patient denies having partial dentures. Patient has some dental phobia and says"I'm just scared'.  PROBLEM LIST: Patient Active Problem List   Diagnosis Date Noted  . AF (paroxysmal atrial fibrillation) ([place])   . Coronary artery disease involving native coronary artery of native heart   . Elevated troponin   . Rheumatic mitral regurgitation   . Acute renal failure superimposed on stage 4 chronic  kidney disease ([place])   . Hydronephrosis of right kidney   . Controlled type 2 diabetes mellitus with diabetic nephropathy ([place])   . Anxiety   . Protein-calorie malnutrition, severe 04/21/2017  . Acute pulmonary edema (H[facility])   . Acute heart failure ([place]) 04/12/2017  . Substance abuse ([place])   . Acute on chronic diastolic CHF (congestive heart failure) ([place])   . Pulmonary hypertension ([place])   . Asystole ([place])   . Acute renal failure superimposed on stage 3 chronic kidney disease ([place])   . Anemia due to chronic kidney disease   . Controlled diabetes mellitus type 2 with complications ([place])   . Acute encephalopathy   . Severe mitral regurgitation   . Stage 4 chronic kidney disease ([employer])   . 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 Microcytic anemia 03/13/2017  . Hyperlipidemia 03/12/2017  . Hypertension 03/12/2017  . Hypothyroidism 03/12/2017  . Diet-controlled diabetes mellitus ([place]) 03/12/2017  . Acute diastolic CHF (congestive heart failure) ([place]) 03/12/2017  . AKI (acute kidney injury) ([place]) 03/12/2017  . Depression 03/12/2017  . CKD (chronic kidney disease), stage III ([place]) 03/12/2017  . CHF (congestive heart failure) ([place]) 03/12/2017  . Dysphagia 03/12/2017  . Acute respiratory failure with hypoxia ([place]) 03/12/2017    PMH: Past Medical History:  Diagnosis Date  . Anxiety   . Arthritis   . Bronchitis   . CKD (chronic kidney disease) stage 4, GFR 15-29 ml/min (HCC) 03/16/2017  . Depression   . Diabetes mellitus without complication ([place])   . Diet-controlled diabetes mellitus ([place])   . GERD (gastroesophageal reflux disease)   . Hyperlipidemia   .  Hypertension   . Hypothyroidism   . Stroke [facility])     PSH: Past Surgical History:  Procedure Laterality Date  . ABDOMINAL HYSTERECTOMY     PARTIALS  . IR NEPHRO TUBE REMOV/FL  04/02/2017  . IR NEPHROSTOGRAM RIGHT THRU EXISTING ACCESS  04/02/2017  . IR NEPHROSTOMY PLACEMENT RIGHT  03/27/2017  . IR THORACENTESIS ASP PLEURAL SPACE  W/IMG GUIDE  03/24/2017  . RIGHT/LEFT HEART CATH AND CORONARY ANGIOGRAPHY N/A 07/03/2017   Procedure: RIGHT/LEFT HEART CATH AND CORONARY ANGIOGRAPHY;  Surgeon: Larey Dresser, MD;  Location: [facility];  Service: Cardiovascular;  Laterality: N/A;  . TONSILLECTOMY    . TUBAL LIGATION      ALLERGIES: Allergies  Allergen Reactions  . Ace Inhibitors Anaphylaxis and Swelling    MEDICATIONS: Current Outpatient Medications  Medication Sig Dispense Refill  . acetaminophen (TYLENOL) 325 MG tablet Take 2 tablets (650 mg total) by mouth every 6 (six) hours as needed for mild pain, fever or headache. 30 tablet 0  . albuterol (PROVENTIL HFA;VENTOLIN HFA) 108 (90 Base) MCG/ACT inhaler Inhale 2 puffs into the lungs every 6 (six) hours as needed for wheezing or shortness of breath.    Marland Kitchen amLODipine (NORVASC) 10 MG tablet Take 1 tablet (10 mg total) by mouth daily. 30 tablet 0  . aspirin EC 81 MG EC tablet Take 1 tablet (81 mg total) by mouth daily. 30 tablet 0  . atorvastatin (LIPITOR) 40 MG tablet Take 1 tablet (40 mg total) by mouth daily. 30 tablet 0  . b complex vitamins tablet Take 1 tablet by mouth daily.    Marland Kitchen buPROPion (WELLBUTRIN XL) 300 MG 24 hr tablet Take 1 tablet (300 mg total) by mouth daily. 30 tablet 0  . busPIRone (BUSPAR) 15 MG tablet Take 1 tablet (15 mg total) by mouth 2 (two) times daily. 60 tablet 0  . carvedilol (COREG) 6.25 MG tablet Take 1 tablet (6.25 mg total) by mouth 2 (two) times daily with a meal. (Patient taking differently: Take 12.5 mg by mouth 2 (two) times daily with a meal. ) 60 tablet 0  . cholecalciferol (VITAMIN D) 1000 units tablet Take 1,000 Units by mouth daily.    . Darbepoetin Alfa (ARANESP) 40 MCG/0.4ML SOSY injection Inject 0.4 mLs (40 mcg total) into the skin every Tuesday at 6 PM. (Patient not taking: Reported on 07/03/2017) 8.4 mL 0  . docusate sodium (COLACE) 100 MG capsule Take 1 capsule (100 mg total) by mouth 2 (two) times daily. 10 capsule 0  .  doxazosin (CARDURA) 2 MG tablet Take 1 tablet (2 mg total) by mouth daily. 30 tablet 0  . DULoxetine (CYMBALTA) 30 MG capsule Take 1 capsule (30 mg total) by mouth daily. 30 capsule 0  . ferrous sulfate 325 (65 FE) MG tablet Take 1 tablet (325 mg total) by mouth 2 (two) times daily with a meal. (Patient not taking: Reported on 07/03/2017) 60 tablet 0  . furosemide (LASIX) 80 MG tablet Take 1.5 tablets (120 mg total) by mouth 2 (two) times daily. (Patient not taking: Reported on 06/18/2017) 90 tablet 6  . furosemide (LASIX) 80 MG tablet Take 160 mg (2 Tabs) in the AM and 120 mg (1.5 Tabs) In the PM. 105 tablet 3  . hydrALAZINE (APRESOLINE) 100 MG tablet Take 1 tablet (100 mg total) by mouth every 8 (eight) hours. 90 tablet 0  . insulin aspart (NOVOLOG) 100 UNIT/ML injection Inject 0-20 Units into the skin 3 (three) times daily with  meals. (Patient not taking: Reported on 07/03/2017) 10 mL 11  . isosorbide mononitrate (IMDUR) 120 MG 24 hr tablet Take 1 tablet (120 mg total) by mouth daily. 30 tablet 0  . levothyroxine (SYNTHROID, LEVOTHROID) 112 MCG tablet Take 1 tablet (112 mcg total) by mouth daily before breakfast. 30 tablet 0  . lidocaine (LIDODERM) 5 % Place 1 patch onto the skin daily. Remove & Discard patch within 12 hours or as directed by MD 30 patch 0  . magnesium gluconate (MAGONATE) 500 MG tablet Take 0.5 tablets (250 mg total) by mouth daily. 30 tablet 0  . montelukast (SINGULAIR) 10 MG tablet Take 1 tablet (10 mg total) by mouth daily. 30 tablet 0  . nitroGLYCERIN (NITROSTAT) 0.4 MG SL tablet Place 1 tablet (0.4 mg total) under the tongue every 5 (five) minutes as needed for chest pain. 30 tablet 0  . Nutritional Supplements (FEEDING SUPPLEMENT, NEPRO CARB STEADY,) LIQD Take 237 mLs by mouth 2 (two) times daily between meals. (Patient not taking: Reported on 07/03/2017) 237 mL 0  . ondansetron (ZOFRAN) 4 MG tablet Take 1 tablet (4 mg total) by mouth every 8 (eight) hours as needed for nausea or  vomiting. 30 tablet 0  . oxyCODONE (OXY IR/ROXICODONE) 5 MG immediate release tablet Take 1 tablet (5 mg total) by mouth every 8 (eight) hours as needed (pain). (Patient not taking: Reported on 07/03/2017) 6 tablet 0  . pantoprazole (PROTONIX) 40 MG tablet Take 1 tablet (40 mg total) by mouth 2 (two) times daily before a meal. 60 tablet 0  . polyethylene glycol (MIRALAX / GLYCOLAX) packet Take 17 g by mouth daily as needed (constipation).    . potassium chloride 20 MEQ TBCR Take 20 mEq by mouth daily. 90 tablet 3   No current facility-administered medications for this visit.     LABS: Lab Results  Component Value Date   WBC 9.5 07/03/2017   HGB 12.6 07/03/2017   HCT 37.2 07/03/2017   MCV 75.0 (L) 07/03/2017   PLT 277 07/03/2017      Component Value Date/Time   NA 141 07/17/2017 1447   K 3.2 (L) 07/17/2017 1447   CL 102 07/17/2017 1447   CO2 26 07/17/2017 1447   GLUCOSE 133 (H) 07/17/2017 1447   BUN 35 (H) 07/17/2017 1447   CREATININE 3.60 (H) 07/17/2017 1447   CALCIUM 9.7 07/17/2017 1447   CALCIUM 9.7 04/21/2017 0231   GFRNONAA 12 (L) 07/17/2017 1447   GFRAA 14 (L) 07/17/2017 1447   Lab Results  Component Value Date   INR 0.90 07/03/2017   INR 1.46 04/12/2017   INR 1.34 03/26/2017   No results found for: PTT  SOCIAL HISTORY: Social History   Socioeconomic History  . Marital status: Divorced    Spouse name: Not on file  . Number of children: Not on file  . Years of education: Not on file  . Highest education level: Not on file  Social Needs  . Financial resource strain: Not on file  . Food insecurity - worry: Not on file  . Food insecurity - inability: Not on file  . Transportation needs - medical: Not on file  . Transportation needs - non-medical: Not on file  Occupational History  . Not on file  Tobacco Use  . Smoking status: Former Smoker    Types: Cigarettes  . Smokeless tobacco: Never Used  Substance and Sexual Activity  . Alcohol use: No  . Drug use:  No  . Sexual activity:  No  Other Topics Concern  . Not on file  Social History Narrative  . Not on file    FAMILY HISTORY: Family History  Problem Relation Age of Onset  . Hypertension Mother     REVIEW OF SYSTEMS: Reviewed with the patient as per History of present illness. Psych: Patient does have some dental phobia.  DENTAL HISTORY: CHIEF COMPLAINT: Patient initially referred by Dr. Roxy Manns for a Dental Consultation on 03/30/2017.  HPI: Quiana Cobaugh. Wohlford is a 67 year old female initially seen for a dental consultation on 03/30/2017.  Patient was diagnosed with severe mitral regurgitation. Patient with anticipated heart valve surgery.Patient was seen then for pre-heart valve surgery dental protocol examination to rule out dental infection that may affect the patient's systemic health and Anticipated heart valve surgery.  Patient felt to be too ill at that time to proceed with dental procedures in the operating with general anesthesia. Dental treatment was then deferred at that time. Patient now presents for periodic oral examination, radiographs, and discussion of dental treatment needs.  The patient currently is complaining of upper right and upper left toothache symptoms. Patient describes dull, aching involving the upper right and upper left root segments. The pain reached an intensity of 10 out of 10 but is currently 3 out of 10.  This occurs in an intermittent basis. The pain is spontaneous at times. Patient last saw a dentist in 2018 to have a crown re-cemented. This is since fallen off. This was with a dentist in [place], [place]. The patient denies having regular dental care. Patient denies having partial dentures. Patient has some dental phobia and says"I'm just scared'.  DENTAL EXAMINATION: GENERAL:the patient is a well-developed, well-nourished female in no acute distress. HEAD AND NECK:  There is no palpable neck lymphadenopathy. The patient denies acute TMJ  symptoms. INTRAORAL EXAM:  The patient has xerostomia. There is no evidence of oral abscess formation. DENTITION:  Patient is missing tooth numbers 1, 3, 13, 15, 16, 17, 19, and 30 through 32. There are retained roots in the area of tooth numbers 4, 8, 9, 10, 12, and 14. PERIODONTAL:  The patient has chronic periodontitis with plaque and calculus accumulations, gingival recession, and incipient to moderate bone loss. DENTAL CARIES/SUBOPTIMAL RESTORATIONS:  Multiple dental caries are noted as per dental charting form. ENDODONTIC: patient has a history of acute pulpitis symptoms. Patient does have periapical pathology associated with tooth numbers 4, 7, 9, 10, 12, and 14. CROWN AND BRIDGE:  There are no crown or bridge restorations noted. PROSTHODONTIC:patient denies having partial dentures. OCCLUSION:  Patient has a poor occlusal scheme secondary to multiple missing teeth, supra-eruption and drifting of the unopposedteeth into the edentulous areas, deep overbite, and lack of replacement of missing teeth with dental prostheses.  RADIOGRAPHIC INTERPRETATION: Orthopantogram was taken today and supplemented with 11 periapical radiographs. There are multiple missing teeth. There multiple retained root segments. There multiple areas appear apical pathology and radiolucency. Dental caries are noted. There is incipient to moderate bone loss noted. There is supra-eruption and drifting of the unopposed teeth into the edentulous areas. Malocclusion is noted.   ASSESSMENTS: 1. Severe mitral regurgitation 2. Pre-heart valve surgery dental protocol 3. Chronic apical periodontitis 4. History of acute pulpitis 5. Multiple retained root segments. 6. Dental caries 7. Chronic periodontitis of bone loss 8. Accretions 9. Gingival recession 10. Incipient tooth mobility 11. Multiple missing teeth 12. Deep overbite 13.Malocclusion 14. Risk for complications up to and including death with anticipated invasive  dental procedures in the operating with general anesthesia secondary to cardiovascular compromise. 15. Dental phobia   PLAN/RECOMMENDATIONS: 1. I discussed the risks, benefits, and complications of various treatment options with the patient in relationship to her medical and dental conditions, anticipated heart valve surgery, and risk for endocarditis. We discussed various treatment options to include no treatment, multiple extractions with alveoloplasty, pre-prosthetic surgery as indicated, periodontal therapy, dental restorations, root canal therapy, crown and bridge therapy, implant therapy, and replacement of missing teeth as indicated. The patient currently wishes to proceed with multiple extractions with alveoloplasty and gross debridement of remaining dentition in the operating room with general anesthesia.  Prior to scheduling the surgical procedure, the patient will need to be cleared for surgery by the cardiology team. The patient will need to have a history and physical type note written by cardiology team at that time to allow me to schedule dental operating room procedure. The patient will then follow-up with Dr. Roxy Manns for scheduling of anticipated heart valve surgery after adequate healing from a dental procedures. Patient will also need to follow up with the dentist of her choice after adequate dental healing and once medically cleared from the anticipated heart valve surgery to continue comprehensive dental care.  2. Discussion of findings with medical team and coordination of future medical and dental care as needed.  I spent in excess of  120 minutes during the conduct of this consultation and >50% of this time involved direct face-to-face encounter for counseling and/or coordination of the patient's care.    [name], DDS

## 2017-08-09 NOTE — Progress Notes (Signed)
OK from my standpoint for dental procedure.

## 2017-08-10 NOTE — Progress Notes (Signed)
Ok, we will get her in.  She needs to be seen anyway.   Jocelyn Hill/Jocelyn Hill: This patient needs followup ASAP.  Would have her get BMET the day before if possible.

## 2017-08-11 NOTE — Progress Notes (Signed)
Scheduled

## 2017-08-26 ENCOUNTER — Telehealth (HOSPITAL_COMMUNITY): Payer: Self-pay | Admitting: Cardiology

## 2017-08-26 NOTE — Progress Notes (Signed)
Sent to [facility] for scheduling

## 2017-08-26 NOTE — Telephone Encounter (Signed)
LVM for patient to call the office. Per Shirley Muscat, RN, needs appts moved up to next week for cardiac clearance, APP side & Echo.  Need to give patient appts for 09/04/17 when she calls back.

## 2017-08-28 ENCOUNTER — Telehealth (HOSPITAL_COMMUNITY): Payer: Self-pay

## 2017-08-28 NOTE — Telephone Encounter (Signed)
Advanced Heart Failure Triage Encounter  Patient Name: Jocelyn Hill  Date of Call: 08/28/17  Problem:  Pt left VM about having chest pain, her PCP told her to call. Called pt back with no answer stated if CP continues pt should go to ED and to call back asap.   Plan:    Shirley Muscat, RN

## 2017-09-04 ENCOUNTER — Ambulatory Visit (HOSPITAL_COMMUNITY)
Admission: RE | Admit: 2017-09-04 | Discharge: 2017-09-04 | Disposition: A | Discharge status: Home / Self Care | Payer: Medicare Other | Source: Ambulatory Visit | Attending: Cardiology | Admitting: Cardiology

## 2017-09-04 ENCOUNTER — Encounter (HOSPITAL_COMMUNITY): Payer: Self-pay

## 2017-09-04 ENCOUNTER — Ambulatory Visit (HOSPITAL_BASED_OUTPATIENT_CLINIC_OR_DEPARTMENT_OTHER)
Admission: RE | Admit: 2017-09-04 | Discharge: 2017-09-04 | Disposition: A | Discharge status: Home / Self Care | Payer: Medicare Other | Source: Ambulatory Visit | Attending: Cardiology | Admitting: Cardiology

## 2017-09-04 VITALS — BP 162/88 | HR 72 | Wt 133.0 lb

## 2017-09-04 DIAGNOSIS — I051 Rheumatic mitral insufficiency: Secondary | ICD-10-CM

## 2017-09-04 DIAGNOSIS — I5032 Chronic diastolic (congestive) heart failure: Secondary | ICD-10-CM

## 2017-09-04 DIAGNOSIS — N183 Chronic kidney disease, stage 3 unspecified: Secondary | ICD-10-CM

## 2017-09-04 DIAGNOSIS — F419 Anxiety disorder, unspecified: Secondary | ICD-10-CM | POA: Insufficient documentation

## 2017-09-04 DIAGNOSIS — R0789 Other chest pain: Secondary | ICD-10-CM | POA: Insufficient documentation

## 2017-09-04 DIAGNOSIS — I480 Paroxysmal atrial fibrillation: Secondary | ICD-10-CM

## 2017-09-04 DIAGNOSIS — N184 Chronic kidney disease, stage 4 (severe): Secondary | ICD-10-CM | POA: Diagnosis not present

## 2017-09-04 DIAGNOSIS — K219 Gastro-esophageal reflux disease without esophagitis: Secondary | ICD-10-CM | POA: Diagnosis not present

## 2017-09-04 DIAGNOSIS — Z8673 Personal history of transient ischemic attack (TIA), and cerebral infarction without residual deficits: Secondary | ICD-10-CM | POA: Insufficient documentation

## 2017-09-04 DIAGNOSIS — E1122 Type 2 diabetes mellitus with diabetic chronic kidney disease: Secondary | ICD-10-CM | POA: Insufficient documentation

## 2017-09-04 DIAGNOSIS — F329 Major depressive disorder, single episode, unspecified: Secondary | ICD-10-CM | POA: Diagnosis not present

## 2017-09-04 DIAGNOSIS — I2510 Atherosclerotic heart disease of native coronary artery without angina pectoris: Secondary | ICD-10-CM | POA: Insufficient documentation

## 2017-09-04 DIAGNOSIS — I5031 Acute diastolic (congestive) heart failure: Secondary | ICD-10-CM

## 2017-09-04 DIAGNOSIS — Z7982 Long term (current) use of aspirin: Secondary | ICD-10-CM | POA: Diagnosis not present

## 2017-09-04 DIAGNOSIS — I130 Hypertensive heart and chronic kidney disease with heart failure and stage 1 through stage 4 chronic kidney disease, or unspecified chronic kidney disease: Secondary | ICD-10-CM | POA: Insufficient documentation

## 2017-09-04 DIAGNOSIS — Z79899 Other long term (current) drug therapy: Secondary | ICD-10-CM | POA: Diagnosis not present

## 2017-09-04 DIAGNOSIS — I252 Old myocardial infarction: Secondary | ICD-10-CM | POA: Diagnosis not present

## 2017-09-04 DIAGNOSIS — E785 Hyperlipidemia, unspecified: Secondary | ICD-10-CM | POA: Diagnosis not present

## 2017-09-04 DIAGNOSIS — I340 Nonrheumatic mitral (valve) insufficiency: Secondary | ICD-10-CM | POA: Diagnosis not present

## 2017-09-04 DIAGNOSIS — Z79890 Hormone replacement therapy (postmenopausal): Secondary | ICD-10-CM | POA: Insufficient documentation

## 2017-09-04 DIAGNOSIS — Z87891 Personal history of nicotine dependence: Secondary | ICD-10-CM | POA: Insufficient documentation

## 2017-09-04 LAB — CBC
HCT: 33.7 % — ABNORMAL LOW (ref 36.0–46.0)
Hemoglobin: 11.4 g/dL — ABNORMAL LOW (ref 12.0–15.0)
MCH: 26.8 pg (ref 26.0–34.0)
MCHC: 33.8 g/dL (ref 30.0–36.0)
MCV: 79.1 fL (ref 78.0–100.0)
Platelets: 245 10*3/uL (ref 150–400)
RBC: 4.26 MIL/uL (ref 3.87–5.11)
RDW: 14.1 % (ref 11.5–15.5)
WBC: 8.5 10*3/uL (ref 4.0–10.5)

## 2017-09-04 LAB — BASIC METABOLIC PANEL
Anion gap: 14 (ref 5–15)
BUN: 39 mg/dL — ABNORMAL HIGH (ref 6–20)
CO2: 22 mmol/L (ref 22–32)
Calcium: 9.5 mg/dL (ref 8.9–10.3)
Chloride: 103 mmol/L (ref 101–111)
Creatinine, Ser: 4.06 mg/dL — ABNORMAL HIGH (ref 0.44–1.00)
GFR calc Af Amer: 12 mL/min — ABNORMAL LOW (ref >60)
GFR calc non Af Amer: 11 mL/min — ABNORMAL LOW (ref >60)
Glucose, Bld: 168 mg/dL — ABNORMAL HIGH (ref 65–99)
Potassium: 3.1 mmol/L — ABNORMAL LOW (ref 3.5–5.1)
Sodium: 139 mmol/L (ref 135–145)

## 2017-09-04 MED ORDER — APIXABAN 2.5 MG PO TABS: 2.5000 mg | ORAL_TABLET | Freq: Two times a day (BID) | ORAL | 11 refills | Status: DC

## 2017-09-04 NOTE — Progress Notes (Signed)
Advanced Heart Failure Clinic Note   PCP: Dr. Dorita Fray ([place]) HF Cardiology: Dr. Aundra Dubin  HPI: Jocelyn Hill is a 67 y.o. female  with h/o mitral regurgitation, chronic diastolic CHF, CAD s/p MI, long standing HTN, DM2, CKD IV-V, h/o Stroke. Anxiety, depression and GERD. Patient reported that she has had 2 MIs treated at [facility] in [place] several years ago.   Presented to [facility] on 03/12/2017 with acute hypoxic respiratory failure. ECHO showed normal LV function with severe MR. Diuresed with IV lasix over 20 pounds. She was being considered for MVR by Dr Roxy Manns but due to multiple complications she was not a candidate. Hospital course was complicated by acute respiratory failure, acute renal failure, atrial fibrillation, retroperitoneal bleed, r hydronephrosis (perc tube) and E coli bacteremia. She was started on anticoagulation for atrial fibrillation but this was stopped with RP hematoma.  She converted to NSR on amiodarone but developed junctional rhythm so amiodarone was stopped. Creatinine peaked at 5.  She did not require dialysis and gradually renal function improved. Discharged to SNF without diuretics. Discharge weight was 124 pounds.   Admitted again 04/12/17-04/23/17 with respiratory failure from [facility]. She had gained 16 pounds at SNF as she was not on diuretic.  CXR with bilateral infiltrates. Placed on vanc and zosyn. Pertinent admission labs included BNP > 3000, creatinine 3.75, K 4.1, WBC 16.1, procalcitonin 2.06, and troponin 0.04. Requiring 100% high flow oxygen. Blood CX - NGTD. Diuresing with high dose IV lasix.  Nephrology consulted, renal US with no evidence of hydronephrosis (pt had percutaneous nephrostomy drain the month prior due to this). Also placed on NTG drip for HTN and CP. She was seen by Dr. Roxy Manns again and felt still to not be a MVR candidate. Discharged on Lasix 160 mg BID. Discharge weight was 114 pounds.   TTE 03/12/17 LVEF 60-65%,  Grade 2 DD, Severe MR, Severe LAE, Mild RAE, Mild/Mod TR, PA peak pressure 60 mm hg, small/mod pericardial effusion.  TEE 03/17/17 LVEF 55-60%, Trivial TR, Severe MR, mild LAE, mild mod TR. No evidence of vegetation.  She presents today for follow up and cardiac clearance for Dental procedure pre- CABG. She feels about the same as last visit.  She continues to have intermittent chest tightness throughout the week. Averages 3-5 times per week, occasionally several days in a row. It is usually not exertional, but she does have known blockages as below. She saw Dr. Hollie Salk last week and was told she is approaching dialysis, and being considered for PD. She has had intermittent headaches.  Carvedilol and doxasin adjusted within the past 3 weeks. She takes NTG as needed. She is nervous about all of the potential procedures moving forward.   ECG (10/18): NSR, old ASMI (personally reviewed)  Labs (10/18): K 3.8, creatinine 2.88  Review of systems complete and found to be negative unless listed in HPI.    PMH: 1. Chronic diastolic CHF:  - TTE 09/12/38 LVEF 60-65%, Grade 2 DD, Severe MR, Severe LAE, Mild RAE, Mild/Mod TR, PA peak pressure 60 mm hg, small/mod pericardial effusion. 2. CKD: stage IV.  3. Anxiety/panic attacks 4. DM 5. HTN 6. CAD: Patient reports prior MI treated at [facility].  No records available 7. Retroperitoneal hematoma: With anticoagulation in 9/18.  8. Hydronephrosis due to RP hematoma: Resolved.  9. GERD 10. Hyperlipidemia 11. H/o CVA 12. 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  - TEE 03/17/17 LVEF 55-60%, Trivial TR, Severe MR, mild LAE, mild  mod TR. No evidence of vegetation. 13. Atrial fibrillation: Paroxysmal.  Noted in 9/18, not anticoagulated currently due to RP hematoma.  - Junctional rhythm on amiodarone, amiodarone stopped.  Current Outpatient Medications  Medication Sig Dispense Refill  . acetaminophen (TYLENOL) 325 MG tablet Take 2 tablets  (650 mg total) by mouth every 6 (six) hours as needed for mild pain, fever or headache. 30 tablet 0  . albuterol (PROVENTIL HFA;VENTOLIN HFA) 108 (90 Base) MCG/ACT inhaler Inhale 2 puffs into the lungs every 6 (six) hours as needed for wheezing or shortness of breath.    Marland Kitchen amLODipine (NORVASC) 10 MG tablet Take 1 tablet (10 mg total) by mouth daily. 30 tablet 0  . aspirin EC 81 MG EC tablet Take 1 tablet (81 mg total) by mouth daily. 30 tablet 0  . atorvastatin (LIPITOR) 40 MG tablet Take 1 tablet (40 mg total) by mouth daily. 30 tablet 0  . b complex vitamins tablet Take 1 tablet by mouth daily.    Marland Kitchen buPROPion (WELLBUTRIN XL) 300 MG 24 hr tablet Take 1 tablet (300 mg total) by mouth daily. 30 tablet 0  . busPIRone (BUSPAR) 15 MG tablet Take 1 tablet (15 mg total) by mouth 2 (two) times daily. 60 tablet 0  . carvedilol (COREG) 6.25 MG tablet Take 1 tablet (6.25 mg total) by mouth 2 (two) times daily with a meal. (Patient taking differently: Take 25 mg by mouth 2 (two) times daily with a meal. ) 60 tablet 0  . cholecalciferol (VITAMIN D) 1000 units tablet Take 1,000 Units by mouth daily.    Marland Kitchen docusate sodium (COLACE) 100 MG capsule Take 1 capsule (100 mg total) by mouth 2 (two) times daily. 10 capsule 0  . doxazosin (CARDURA) 2 MG tablet Take 1 tablet (2 mg total) by mouth daily. (Patient taking differently: Take 4 mg by mouth daily. ) 30 tablet 0  . DULoxetine (CYMBALTA) 30 MG capsule Take 1 capsule (30 mg total) by mouth daily. 30 capsule 0  . ferrous sulfate 325 (65 FE) MG tablet Take 1 tablet (325 mg total) by mouth 2 (two) times daily with a meal. 60 tablet 0  . furosemide (LASIX) 80 MG tablet Take 1.5 tablets (120 mg total) by mouth 2 (two) times daily. 90 tablet 6  . hydrALAZINE (APRESOLINE) 100 MG tablet Take 1 tablet (100 mg total) by mouth every 8 (eight) hours. 90 tablet 0  . isosorbide mononitrate (IMDUR) 120 MG 24 hr tablet Take 1 tablet (120 mg total) by mouth daily. 30 tablet 0  .  levothyroxine (SYNTHROID, LEVOTHROID) 112 MCG tablet Take 1 tablet (112 mcg total) by mouth daily before breakfast. 30 tablet 0  . magnesium gluconate (MAGONATE) 500 MG tablet Take 0.5 tablets (250 mg total) by mouth daily. 30 tablet 0  . montelukast (SINGULAIR) 10 MG tablet Take 1 tablet (10 mg total) by mouth daily. 30 tablet 0  . nitroGLYCERIN (NITROSTAT) 0.4 MG SL tablet Place 1 tablet (0.4 mg total) under the tongue every 5 (five) minutes as needed for chest pain. 30 tablet 0  . Nutritional Supplements (FEEDING SUPPLEMENT, NEPRO CARB STEADY,) LIQD Take 237 mLs by mouth 2 (two) times daily between meals. 237 mL 0  . ondansetron (ZOFRAN) 4 MG tablet Take 1 tablet (4 mg total) by mouth every 8 (eight) hours as needed for nausea or vomiting. 30 tablet 0  . oxyCODONE (OXY IR/ROXICODONE) 5 MG immediate release tablet Take 1 tablet (5 mg total) by mouth every 8 (  eight) hours as needed (pain). 6 tablet 0  . pantoprazole (PROTONIX) 40 MG tablet Take 1 tablet (40 mg total) by mouth 2 (two) times daily before a meal. 60 tablet 0  . polyethylene glycol (MIRALAX / GLYCOLAX) packet Take 17 g by mouth daily as needed (constipation).    . potassium chloride 20 MEQ TBCR Take 20 mEq by mouth daily. 90 tablet 3  . Darbepoetin Alfa (ARANESP) 40 MCG/0.4ML SOSY injection Inject 0.4 mLs (40 mcg total) into the skin every Tuesday at 6 PM. (Patient not taking: Reported on 07/03/2017) 8.4 mL 0  . lidocaine (LIDODERM) 5 % Place 1 patch onto the skin daily. Remove & Discard patch within 12 hours or as directed by MD (Patient not taking: Reported on 09/04/2017) 30 patch 0   No current facility-administered medications for this encounter.    Allergies  Allergen Reactions  . Ace Inhibitors Anaphylaxis and Swelling   Social History   Socioeconomic History  . Marital status: Divorced    Spouse name: Not on file  . Number of children: Not on file  . Years of education: Not on file  . Highest education level: Not on file    Social Needs  . Financial resource strain: Not on file  . Food insecurity - worry: Not on file  . Food insecurity - inability: Not on file  . Transportation needs - medical: Not on file  . Transportation needs - non-medical: Not on file  Occupational History  . Not on file  Tobacco Use  . Smoking status: Former Smoker    Types: Cigarettes  . Smokeless tobacco: Never Used  Substance and Sexual Activity  . Alcohol use: No  . Drug use: No  . Sexual activity: No  Other Topics Concern  . Not on file  Social History Narrative  . Not on file   Family History  Problem Relation Age of Onset  . Hypertension Mother    Vitals:   09/04/17 0910  BP: (!) 162/88  Pulse: 72  SpO2: 96%  Weight: 133 lb (60.3 kg)   Wt Readings from Last 3 Encounters:  09/04/17 133 lb (60.3 kg)  07/03/17 131 lb (59.4 kg)  06/18/17 123 lb 6.4 oz (56 kg)    PHYSICAL EXAM:  General: NAD, thin.  HEENT: Normal Neck: Supple. JVP 6-7 . Carotids 2+ bilat; no bruits. No thyromegaly or nodule noted. Cor: PMI nondisplaced. RRR, 2/6 HSM Lungs: CTAB, normal effort. Abdomen: Soft, non-tender, non-distended, no HSM. No bruits or masses. +BS  Extremities: No cyanosis, clubbing, or rash. R and LLE no edema.  Neuro: Alert & orientedx3, cranial nerves grossly intact. moves all 4 extremities w/o difficulty. Affect pleasant   ASSESSMENT & PLAN: 1. Chronic diastolic CHF: ECHO 12/3708 EF 60-65% with severe MR.  - Echo today LVEF 55-60% and only Mod MR - Volume status stable on exam - Continue lasix 120 mg BID. Has had worsening AKI on increased doses.  - Ultimately, need mitral valve fixed to keep her out of CHF.  2. CKD Stage IV:  Had R hydronephrosis in setting of hydronephrosis and required percutaneous drain in 9/18 (removed). Renal US 04/14/17 showed no further evidence of hydronephrosis.  - Saw Dr. Hollie Salk last week and was told she is approaching dialysis with consideration for PD.  - BMET today.  3. HTN:  - BP  elevated today.  She took her medicine just prior to coming, and has had several med adjustments in the past 3 weeks.  Will  continue same for now with CKD IV-V.  - Continue hydralazine 100 mg TID.  - Continue amlodipine 10 mg daily.  - Continue coreg 25 mg BID.  She has previously had junctional bradycardia. Low threshold to decrease.  - Continue doxazosin 4 mg daily.  4.  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  Noted on TEE 02/2017.  Ultimately, she needs mitral valve repair (versus Mitraclip).   - Cardiac cath 07/03/17 with low filling pressure and preserved cardiac output. 90% ostial LCx and "napkin ring" like stenosis in the proximal LAD (at least 60% stenosis) - Echo today shows only moderate MR.  Per Dr. Aundra Dubin, will need repeat TEE to assess for need for MVR.  - Needs to schedule with Dr. Enrique Sack  5. Atrial fibrillation: Paroxysmal.  - NSR by exam today.  - She is off anticoagulation after a spontaneous RP hemorrhage in 9/18.  Discussed with Dr. Aundra Dubin. Will restart her on Eliquis 2.5 mg BID.  - She is off amiodarone due to junctional bradycardia in the hospital.  6. CAD: Episodes of atypical chest pain.  Per patient, has history of MI treated in [place].  - Cardiac cath 07/03/17 with low filling pressure and preserved cardiac output. 90% ostial LCx and "napkin ring" like stenosis in the proximal LAD (at least 60% stenosis) - She continues to have intermittent CP. Ultimately, will need CABG.  - Continue ASA 81 and atorvastatin 40 mg daily.  - She is on Imdur 120 mg daily.   Very complicated patient. Needs to proceed with Dental procedure prior to CABG.  Unfortunately she is nearing dialysis.  Per Dr. Aundra Dubin, she would need to start HD/PD prior to CABG for better access in post op period.   Will forward todays note to Dr. Roxy Manns, Dr. Enrique Sack, Dr. Hollie Salk, and Dr. Aundra Dubin. Discussed at length with Dr. Aundra Dubin during today visit.  Shirley Friar, PA-C 09/04/17   Greater than  50% of the 25 minute visit was spent in counseling/coordination of care regarding disease state education, salt/fluid restriction, sliding scale diuretics, and medication compliance.

## 2017-09-04 NOTE — Progress Notes (Signed)
  Echocardiogram 2D Echocardiogram has been performed.  Jocelyn Hill 09/04/2017, 8:56 AM

## 2017-09-04 NOTE — Patient Instructions (Signed)
Routine lab work today. Will notify you of abnormal results, otherwise no news is good news!  START Eliquis 2.5 mg tablet twice daily.  Follow up with Dr. Aundra Dubin in 4-6 weeks.  Take all medication as prescribed the day of your appointment. Bring all medications with you to your appointment.  Do the following things EVERYDAY: 1) Weigh yourself in the morning before breakfast. Write it down and keep it in a log. 2) Take your medicines as prescribed 3) Eat low salt foods-Limit salt (sodium) to 2000 mg per day.  4) Stay as active as you can everyday 5) Limit all fluids for the day to less than 2 liters

## 2017-09-09 ENCOUNTER — Telehealth (HOSPITAL_COMMUNITY): Payer: Self-pay | Admitting: Dentistry

## 2017-09-09 NOTE — Telephone Encounter (Signed)
09/09/2017  Patient:            Jocelyn Hill Date of Birth:  [birth year] MRN:                [id number]   I have called the patient multiple times in an attempt to schedule the patient for outpatient surgery. Message was left on the patient's answering machine 2 to call back concerning scheduling the dental operating room procedure.   Dr. Enrique Sack

## 2017-09-10 ENCOUNTER — Other Ambulatory Visit (HOSPITAL_COMMUNITY): Payer: Self-pay

## 2017-09-10 ENCOUNTER — Telehealth (HOSPITAL_COMMUNITY): Payer: Self-pay

## 2017-09-10 DIAGNOSIS — I051 Rheumatic mitral insufficiency: Secondary | ICD-10-CM

## 2017-09-10 NOTE — Telephone Encounter (Signed)
Notes recorded by Shirley Muscat, RN on 09/10/2017 at 3:38 PM EDT Pt aware and scheduled for 09/18/17 at 12 pm ------  Notes recorded by Larey Dresser, MD on 09/07/2017 at 2:34 PM EDT Normal EF with only moderate rheumatic MR noted on this echo. Will need repeat TEE to see if we can hold off on mitral valve repair.

## 2017-09-14 ENCOUNTER — Telehealth (HOSPITAL_COMMUNITY): Payer: Self-pay | Admitting: Dentistry

## 2017-09-14 ENCOUNTER — Other Ambulatory Visit (HOSPITAL_COMMUNITY): Payer: Self-pay | Admitting: Dentistry

## 2017-09-14 NOTE — Telephone Encounter (Signed)
09/14/2017  Patient:            Jocelyn Hill. Schlie Date of Birth:  04-04-1951 MRN:                [id number]   I called the patient on the telephone at home and discussed the plan of care for multiple extractions with alveoloplasty and gross debridement of remaining dentition in the operating room with general anesthesia on 09/24/2017 at 7:30 AM. Patient was reminded to be nothing by mouth after midnight on the morning of the surgery. Patient also was reminded to discontinue her Eliquis therapy on Monday after her morning dose. Patient is to be seen for a TEE by Dr. Aundra Dubin on Friday and presurgical testing will attempt to see the patient that Friday morning as well. She is to call if she has any questions.   [name], DDS

## 2017-09-15 ENCOUNTER — Other Ambulatory Visit (HOSPITAL_COMMUNITY): Payer: Medicare Other

## 2017-09-15 ENCOUNTER — Encounter (HOSPITAL_COMMUNITY): Payer: Self-pay

## 2017-09-15 ENCOUNTER — Encounter (HOSPITAL_COMMUNITY): Payer: Self-pay | Admitting: Cardiology

## 2017-09-15 NOTE — Progress Notes (Signed)
FMLA on behalf of daughter completed and fax emailed to daughter per request to provided email [email or web address]  Copy of forms scanned into electronic medical record.  Renee Pain, RN

## 2017-09-15 NOTE — H&P (View-Only) (Signed)
FMLA on behalf of daughter completed and fax emailed to daughter per request to provided email [email or web address]  Copy of forms scanned into electronic medical record.  Renee Pain, RN

## 2017-09-16 DIAGNOSIS — N186 End stage renal disease: Secondary | ICD-10-CM

## 2017-09-16 DIAGNOSIS — E1165 Type 2 diabetes mellitus with hyperglycemia: Secondary | ICD-10-CM

## 2017-09-16 DIAGNOSIS — N179 Acute kidney failure, unspecified: Secondary | ICD-10-CM

## 2017-09-18 ENCOUNTER — Ambulatory Visit (HOSPITAL_COMMUNITY): Admission: RE | Admit: 2017-09-18 | Payer: Medicare Other | Source: Ambulatory Visit

## 2017-09-18 ENCOUNTER — Encounter (HOSPITAL_COMMUNITY): Admission: RE | Payer: Self-pay | Source: Ambulatory Visit

## 2017-09-18 ENCOUNTER — Ambulatory Visit (HOSPITAL_COMMUNITY): Admission: RE | Admit: 2017-09-18 | Payer: Medicare Other | Source: Ambulatory Visit | Admitting: Cardiology

## 2017-09-18 DIAGNOSIS — F419 Anxiety disorder, unspecified: Secondary | ICD-10-CM

## 2017-09-18 SURGERY — ECHOCARDIOGRAM, TRANSESOPHAGEAL
Anesthesia: Moderate Sedation

## 2017-09-18 MED ORDER — DULOXETINE HCL 30 MG PO CPEP
30.00 | ORAL_CAPSULE | ORAL | Status: DC
Start: 2017-09-19 — End: 2017-09-18

## 2017-09-18 MED ORDER — NITROGLYCERIN 2 % TD OINT
1.00 | TOPICAL_OINTMENT | TRANSDERMAL | Status: DC
Start: 2017-09-18 — End: 2017-09-18

## 2017-09-18 MED ORDER — GLUCAGON HCL (DIAGNOSTIC) 1 MG IJ SOLR
1.00 | INTRAMUSCULAR | Status: DC
Start: ? — End: 2017-09-18

## 2017-09-18 MED ORDER — OXYCODONE HCL 5 MG PO TABS
5.00 | ORAL_TABLET | ORAL | Status: DC
Start: ? — End: 2017-09-18

## 2017-09-18 MED ORDER — DOCUSATE SODIUM 100 MG PO CAPS
100.00 | ORAL_CAPSULE | ORAL | Status: DC
Start: 2017-09-19 — End: 2017-09-18

## 2017-09-18 MED ORDER — MELATONIN 3 MG PO TABS
3.00 | ORAL_TABLET | ORAL | Status: DC
Start: ? — End: 2017-09-18

## 2017-09-18 MED ORDER — HYDRALAZINE HCL 50 MG PO TABS
100.00 | ORAL_TABLET | ORAL | Status: DC
Start: 2017-09-18 — End: 2017-09-18

## 2017-09-18 MED ORDER — NITROGLYCERIN 0.4 MG SL SUBL
0.40 | SUBLINGUAL_TABLET | SUBLINGUAL | Status: DC
Start: ? — End: 2017-09-18

## 2017-09-18 MED ORDER — GENERIC EXTERNAL MEDICATION
30.00 | Status: DC
Start: 2017-09-18 — End: 2017-09-18

## 2017-09-18 MED ORDER — GLUCOSE 4 G PO CHEW
12.00 | CHEWABLE_TABLET | ORAL | Status: DC
Start: ? — End: 2017-09-18

## 2017-09-18 MED ORDER — MORPHINE SULFATE (PF) 2 MG/ML IV SOLN
1.00 | INTRAVENOUS | Status: DC
Start: ? — End: 2017-09-18

## 2017-09-18 MED ORDER — ACETAMINOPHEN 500 MG PO TABS
500.00 | ORAL_TABLET | ORAL | Status: DC
Start: ? — End: 2017-09-18

## 2017-09-18 MED ORDER — ALBUTEROL SULFATE HFA 108 (90 BASE) MCG/ACT IN AERS
2.00 | INHALATION_SPRAY | RESPIRATORY_TRACT | Status: DC
Start: ? — End: 2017-09-18

## 2017-09-18 MED ORDER — APIXABAN 2.5 MG PO TABS
2.50 | ORAL_TABLET | ORAL | Status: DC
Start: 2017-09-18 — End: 2017-09-18

## 2017-09-18 MED ORDER — DOXAZOSIN MESYLATE 4 MG PO TABS
4.00 | ORAL_TABLET | ORAL | Status: DC
Start: 2017-09-18 — End: 2017-09-18

## 2017-09-18 MED ORDER — ONDANSETRON HCL 4 MG/2ML IJ SOLN
4.00 | INTRAMUSCULAR | Status: DC
Start: ? — End: 2017-09-18

## 2017-09-18 MED ORDER — PANTOPRAZOLE SODIUM 40 MG PO TBEC
40.00 | DELAYED_RELEASE_TABLET | ORAL | Status: DC
Start: 2017-09-18 — End: 2017-09-18

## 2017-09-18 MED ORDER — INSULIN LISPRO 100 UNIT/ML ~~LOC~~ SOLN
0.00 | SUBCUTANEOUS | Status: DC
Start: 2017-09-18 — End: 2017-09-18

## 2017-09-18 MED ORDER — GABAPENTIN 300 MG PO CAPS
300.00 | ORAL_CAPSULE | ORAL | Status: DC
Start: 2017-09-18 — End: 2017-09-18

## 2017-09-18 MED ORDER — LEVOTHYROXINE SODIUM 112 MCG PO TABS
112.00 | ORAL_TABLET | ORAL | Status: DC
Start: 2017-09-19 — End: 2017-09-18

## 2017-09-18 MED ORDER — METOPROLOL TARTRATE 5 MG/5ML IV SOLN
5.00 | INTRAVENOUS | Status: DC
Start: ? — End: 2017-09-18

## 2017-09-18 MED ORDER — MONTELUKAST SODIUM 10 MG PO TABS
10.00 | ORAL_TABLET | ORAL | Status: DC
Start: 2017-09-18 — End: 2017-09-18

## 2017-09-18 MED ORDER — ATORVASTATIN CALCIUM 40 MG PO TABS
40.00 | ORAL_TABLET | ORAL | Status: DC
Start: 2017-09-19 — End: 2017-09-18

## 2017-09-18 MED ORDER — MULTI-VITAMINS PO TABS
1.00 | ORAL_TABLET | ORAL | Status: DC
Start: 2017-09-19 — End: 2017-09-18

## 2017-09-18 MED ORDER — BUPROPION HCL 100 MG PO TABS
100.00 | ORAL_TABLET | ORAL | Status: DC
Start: 2017-09-18 — End: 2017-09-18

## 2017-09-18 MED ORDER — AMLODIPINE BESYLATE 10 MG PO TABS
10.00 | ORAL_TABLET | ORAL | Status: DC
Start: 2017-09-19 — End: 2017-09-18

## 2017-09-18 MED ORDER — VITAMIN-B COMPLEX PO TABS
1.00 | ORAL_TABLET | ORAL | Status: DC
Start: 2017-09-19 — End: 2017-09-18

## 2017-09-18 MED ORDER — AZELASTINE HCL 0.1 % NA SOLN
2.00 | NASAL | Status: DC
Start: 2017-09-18 — End: 2017-09-18

## 2017-09-18 MED ORDER — HYDRALAZINE HCL 20 MG/ML IJ SOLN
10.00 | INTRAMUSCULAR | Status: DC
Start: ? — End: 2017-09-18

## 2017-09-18 MED ORDER — CHOLECALCIFEROL 25 MCG (1000 UT) PO TABS
2000.00 | ORAL_TABLET | ORAL | Status: DC
Start: 2017-09-19 — End: 2017-09-18

## 2017-09-18 MED ORDER — POLYETHYLENE GLYCOL 3350 17 G PO PACK
17.00 | PACK | ORAL | Status: DC
Start: ? — End: 2017-09-18

## 2017-09-18 MED ORDER — GLUCOSE 40 % PO GEL
ORAL | Status: DC
Start: ? — End: 2017-09-18

## 2017-09-18 MED ORDER — DEXTROSE 50 % IV SOLN
50.00 | INTRAVENOUS | Status: DC
Start: ? — End: 2017-09-18

## 2017-09-18 MED ORDER — CARVEDILOL 25 MG PO TABS
25.00 | ORAL_TABLET | ORAL | Status: DC
Start: 2017-09-18 — End: 2017-09-18

## 2017-09-18 MED ORDER — BUSPIRONE HCL 15 MG PO TABS
15.00 | ORAL_TABLET | ORAL | Status: DC
Start: 2017-09-18 — End: 2017-09-18

## 2017-09-18 MED ORDER — ASPIRIN 81 MG PO CHEW
81.00 | CHEWABLE_TABLET | ORAL | Status: DC
Start: 2017-09-19 — End: 2017-09-18

## 2017-09-19 DIAGNOSIS — N170 Acute kidney failure with tubular necrosis: Secondary | ICD-10-CM | POA: Insufficient documentation

## 2017-09-19 DIAGNOSIS — K056 Periodontal disease, unspecified: Secondary | ICD-10-CM | POA: Insufficient documentation

## 2017-09-20 DIAGNOSIS — J189 Pneumonia, unspecified organism: Secondary | ICD-10-CM | POA: Insufficient documentation

## 2017-09-21 ENCOUNTER — Inpatient Hospital Stay (HOSPITAL_COMMUNITY): Admission: RE | Admit: 2017-09-21 | Payer: Self-pay | Source: Ambulatory Visit

## 2017-09-24 ENCOUNTER — Encounter (HOSPITAL_COMMUNITY): Admission: RE | Payer: Self-pay | Source: Ambulatory Visit

## 2017-09-24 ENCOUNTER — Ambulatory Visit (HOSPITAL_COMMUNITY): Admission: RE | Admit: 2017-09-24 | Payer: Medicare Other | Source: Ambulatory Visit | Admitting: Dentistry

## 2017-09-24 SURGERY — MULTIPLE EXTRACTION WITH ALVEOLOPLASTY
Anesthesia: General

## 2017-09-25 ENCOUNTER — Telehealth (HOSPITAL_COMMUNITY): Payer: Self-pay | Admitting: Cardiology

## 2017-09-25 NOTE — Telephone Encounter (Signed)
Patient returned call to reschedule appt/procedure as she was hospitalized during that time.  During her hospitalization she was scheduled for a TEE  Although patient request a Friday, I am unable to make that work with Dr Aundra Dubin.  TEE 10/08/17 @ 8 Patient arrival 6;30am With patients current insurance, no pre cert required   [facility] for patient

## 2017-09-30 ENCOUNTER — Other Ambulatory Visit (HOSPITAL_COMMUNITY): Payer: Self-pay

## 2017-09-30 DIAGNOSIS — I340 Nonrheumatic mitral (valve) insufficiency: Secondary | ICD-10-CM

## 2017-10-08 ENCOUNTER — Encounter (HOSPITAL_COMMUNITY): Payer: Self-pay | Admitting: *Deleted

## 2017-10-08 ENCOUNTER — Ambulatory Visit (HOSPITAL_BASED_OUTPATIENT_CLINIC_OR_DEPARTMENT_OTHER): Payer: Medicare Other

## 2017-10-08 ENCOUNTER — Encounter (HOSPITAL_COMMUNITY)
Admission: RE | Disposition: A | Discharge status: Home / Self Care | Payer: Self-pay | Source: Ambulatory Visit | Attending: Cardiology

## 2017-10-08 ENCOUNTER — Ambulatory Visit (HOSPITAL_COMMUNITY)
Admission: RE | Admit: 2017-10-08 | Discharge: 2017-10-08 | Disposition: A | Discharge status: Home / Self Care | Payer: Medicare Other | Source: Ambulatory Visit | Attending: Cardiology | Admitting: Cardiology

## 2017-10-08 ENCOUNTER — Other Ambulatory Visit: Payer: Self-pay

## 2017-10-08 DIAGNOSIS — N184 Chronic kidney disease, stage 4 (severe): Secondary | ICD-10-CM | POA: Insufficient documentation

## 2017-10-08 DIAGNOSIS — F329 Major depressive disorder, single episode, unspecified: Secondary | ICD-10-CM | POA: Diagnosis not present

## 2017-10-08 DIAGNOSIS — E1122 Type 2 diabetes mellitus with diabetic chronic kidney disease: Secondary | ICD-10-CM | POA: Diagnosis not present

## 2017-10-08 DIAGNOSIS — Z7951 Long term (current) use of inhaled steroids: Secondary | ICD-10-CM | POA: Diagnosis not present

## 2017-10-08 DIAGNOSIS — Z7982 Long term (current) use of aspirin: Secondary | ICD-10-CM | POA: Insufficient documentation

## 2017-10-08 DIAGNOSIS — K219 Gastro-esophageal reflux disease without esophagitis: Secondary | ICD-10-CM | POA: Insufficient documentation

## 2017-10-08 DIAGNOSIS — M1990 Unspecified osteoarthritis, unspecified site: Secondary | ICD-10-CM | POA: Insufficient documentation

## 2017-10-08 DIAGNOSIS — Z79899 Other long term (current) drug therapy: Secondary | ICD-10-CM | POA: Insufficient documentation

## 2017-10-08 DIAGNOSIS — Z8673 Personal history of transient ischemic attack (TIA), and cerebral infarction without residual deficits: Secondary | ICD-10-CM | POA: Diagnosis not present

## 2017-10-08 DIAGNOSIS — Z87891 Personal history of nicotine dependence: Secondary | ICD-10-CM | POA: Insufficient documentation

## 2017-10-08 DIAGNOSIS — E039 Hypothyroidism, unspecified: Secondary | ICD-10-CM | POA: Diagnosis not present

## 2017-10-08 DIAGNOSIS — Z90710 Acquired absence of both cervix and uterus: Secondary | ICD-10-CM | POA: Insufficient documentation

## 2017-10-08 DIAGNOSIS — E785 Hyperlipidemia, unspecified: Secondary | ICD-10-CM | POA: Diagnosis not present

## 2017-10-08 DIAGNOSIS — I340 Nonrheumatic mitral (valve) insufficiency: Secondary | ICD-10-CM

## 2017-10-08 DIAGNOSIS — Z888 Allergy status to other drugs, medicaments and biological substances status: Secondary | ICD-10-CM | POA: Insufficient documentation

## 2017-10-08 DIAGNOSIS — Z886 Allergy status to analgesic agent status: Secondary | ICD-10-CM | POA: Diagnosis not present

## 2017-10-08 DIAGNOSIS — I129 Hypertensive chronic kidney disease with stage 1 through stage 4 chronic kidney disease, or unspecified chronic kidney disease: Secondary | ICD-10-CM | POA: Insufficient documentation

## 2017-10-08 DIAGNOSIS — Z8249 Family history of ischemic heart disease and other diseases of the circulatory system: Secondary | ICD-10-CM | POA: Diagnosis not present

## 2017-10-08 DIAGNOSIS — F419 Anxiety disorder, unspecified: Secondary | ICD-10-CM | POA: Insufficient documentation

## 2017-10-08 HISTORY — PX: TEE WITHOUT CARDIOVERSION: SHX5443

## 2017-10-08 SURGERY — ECHOCARDIOGRAM, TRANSESOPHAGEAL
Anesthesia: Moderate Sedation

## 2017-10-08 MED ORDER — SODIUM CHLORIDE 0.9 % IV SOLN
INTRAVENOUS | Status: DC
  Administered 2017-10-08: 500 mL via INTRAVENOUS

## 2017-10-08 MED ORDER — FENTANYL CITRATE (PF) 100 MCG/2ML IJ SOLN
INTRAMUSCULAR | Status: AC
  Filled 2017-10-08: qty 2

## 2017-10-08 MED ORDER — MIDAZOLAM HCL 10 MG/2ML IJ SOLN
INTRAMUSCULAR | Status: DC | PRN
  Administered 2017-10-08: 1 mg via INTRAVENOUS
  Administered 2017-10-08 (×2): 2 mg via INTRAVENOUS

## 2017-10-08 MED ORDER — BUTAMBEN-TETRACAINE-BENZOCAINE 2-2-14 % EX AERO
INHALATION_SPRAY | CUTANEOUS | Status: DC | PRN
  Administered 2017-10-08: 2 via TOPICAL

## 2017-10-08 MED ORDER — FENTANYL CITRATE (PF) 100 MCG/2ML IJ SOLN
INTRAMUSCULAR | Status: DC | PRN
  Administered 2017-10-08 (×2): 25 ug via INTRAVENOUS

## 2017-10-08 MED ORDER — MIDAZOLAM HCL 5 MG/ML IJ SOLN
INTRAMUSCULAR | Status: AC
  Filled 2017-10-08: qty 2

## 2017-10-08 NOTE — Progress Notes (Signed)
  Echocardiogram Echocardiogram Transesophageal has been performed.  Merrie Roof F 10/08/2017, 9:28 AM

## 2017-10-08 NOTE — Discharge Instructions (Signed)
Transesophageal Echocardiogram °Transesophageal echocardiography (TEE) is a picture test of your heart using sound waves. The pictures taken can give very detailed pictures of your heart. This can help your doctor see if there are problems with your heart. TEE can check: °· If your heart has blood clots in it. °· How well your heart valves are working. °· If you have an infection on the inside of your heart. °· Some of the major arteries of your heart. °· If your heart valve is working after a repair. °· Your heart before a procedure that uses a shock to your heart to get the rhythm back to normal. ° °What happens before the procedure? °· Do not eat or drink for 6 hours before the procedure or as told by your doctor. °· Make plans to have someone drive you home after the procedure. Do not drive yourself home. °· An IV tube will be put in your arm. °What happens during the procedure? °· You will be given a medicine to help you relax (sedative). It will be given through the IV tube. °· A numbing medicine will be sprayed or gargled in the back of your throat to help numb it. °· The tip of the probe is placed into the back of your mouth. You will be asked to swallow. This helps to pass the probe into your esophagus. °· Once the tip of the probe is in the right place, your doctor can take pictures of your heart. °· You may feel pressure at the back of your throat. °What happens after the procedure? °· You will be taken to a recovery area so the sedative can wear off. °· Your throat may be sore and scratchy. This will go away slowly over time. °· You will go home when you are fully awake and able to swallow liquids. °· You should have someone stay with you for the next 24 hours. °· Do not drive or operate machinery for the next 24 hours. °This information is not intended to replace advice given to you by your health care provider. Make sure you discuss any questions you have with your health care provider. °Document  Released: 04/13/2009 Document Revised: 11/22/2015 Document Reviewed: 12/16/2012 °Elsevier Interactive Patient Education © 2018 [employer]vier Inc[employer] ° ° ° °Moderate Conscious Sedation, Adult, Care After °These instructions provide you with information about caring for yourself after your procedure. Your health care provider may also give you more specific instructions. Your treatment has been planned according to current medical practices, but problems sometimes occur. Call your health care provider if you have any problems or questions after your procedure. °What can I expect after the procedure? °After your procedure, it is common: °· To feel sleepy for several hours. °· To feel clumsy and have poor balance for several hours. °· To have poor judgment for several hours. °· To vomit if you eat too soon. ° °Follow these instructions at home: °For at least 24 hours after the procedure: ° °· Do not: °? Participate in activities where you could fall or become injured. °? Drive. °? Use heavy machinery. °? Drink alcohol. °? Take sleeping pills or medicines that cause drowsiness. °? Make important decisions or sign legal documents. °? Take care of children on your own. °· Rest. °Eating and drinking °· Follow the diet recommended by your health care provider. °· If you vomit: °? Drink water, juice, or soup when you can drink without vomiting. °? Make sure you have little or no nausea before   eating solid foods. °General instructions °· Have a responsible adult stay with you until you are awake and alert. °· Take over-the-counter and prescription medicines only as told by your health care provider. °· If you smoke, do not smoke without supervision. °· Keep all follow-up visits as told by your health care provider. This is important. °Contact a health care provider if: °· You keep feeling nauseous or you keep vomiting. °· You feel light-headed. °· You develop a rash. °· You have a fever. °Get help right away if: °· You have trouble  breathing. °This information is not intended to replace advice given to you by your health care provider. Make sure you discuss any questions you have with your health care provider. °Document Released: 04/06/2013 Document Revised: 11/19/2015 Document Reviewed: 10/06/2015 °Elsevier Interactive Patient Education © 2018 Elsevier Inc. ° ° °

## 2017-10-08 NOTE — Progress Notes (Signed)
Patient's BP in pre-procedure 200/90. Patient did not take any PO BP meds this am.  Pt's scheduled coreg and hydralazine given in pre-procedure.   Vista Lawman, RN

## 2017-10-08 NOTE — Interval H&P Note (Signed)
History and Physical Interval Note:  10/08/2017 8:32 AM  Jocelyn Hill  has presented today for surgery, with the diagnosis of MR  The various methods of treatment have been discussed with the patient and family. After consideration of risks, benefits and other options for treatment, the patient has consented to  Procedure(s): TRANSESOPHAGEAL ECHOCARDIOGRAM (TEE) (N/A) as a surgical intervention .  The patient's history has been reviewed, patient examined, no change in status, stable for surgery.  I have reviewed the patient's chart and labs.  Questions were answered to the patient's satisfaction.     Amana Bouska [employer]

## 2017-10-08 NOTE — CV Procedure (Signed)
Procedure: TEE  Sedation: Versed 2 mg IV, Fentanyl 25 mcg IV  Findings: Please see echo section for full report.  Normal LV size and systolic function, EF 78-24%.  No wall motion abnormalities. Normal RV size and systolic function.  Normal right atrial size.  Mild left atrial enlargement, no LA appendage thrombus.  Trivial TR, peak RV-RA gradient 37 mmHg.   Trivial PI.  Trileaflet aortic valve with trivial aortic insufficiency, no stenosis.  The mitral valve was thickened towards the leaflet tips, morphology suggests rheumatic heart disease.  There was no significant stenosis, mean gradient 4 mmHg.  There was severe mitral regurgitation with ERO 0.42 cm^2.  There was systolic flow reversal in 1 out of 2 pulmonary veins interrogated.  There was a small PFO by color doppler but bubble study was negative.  Grade 4 plaque in the descending thoracic aorta and arch.   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   She will need repair/replacement of the mitral valve.  She is nearing HD.  Will need to followup with both TCTS and nephrology to determine surgical timing.    Jocelyn Hill 10/08/2017 9:11 AM

## 2017-10-09 ENCOUNTER — Other Ambulatory Visit (HOSPITAL_COMMUNITY): Payer: Self-pay

## 2017-10-09 ENCOUNTER — Encounter (HOSPITAL_COMMUNITY): Payer: Self-pay | Admitting: Cardiology

## 2017-10-09 DIAGNOSIS — I340 Nonrheumatic mitral (valve) insufficiency: Secondary | ICD-10-CM

## 2017-10-13 ENCOUNTER — Other Ambulatory Visit (HOSPITAL_COMMUNITY): Payer: Self-pay | Admitting: Dentistry

## 2017-10-13 NOTE — H&P (Signed)
Advanced Heart Failure Team History and Physical Note   PCP:  [name], MD  PCP-Cardiology: No primary care provider on file.      HPI:    Patient presents for TEE.   Review of Systems: All systems reviewed and negative except as per HPI.   Home Medications Prior to Admission medications   Medication Sig Start Date End Date Taking? Authorizing Provider  acetaminophen (TYLENOL) 325 MG tablet Take 2 tablets (650 mg total) by mouth every 6 (six) hours as needed for mild pain, fever or headache. 04/23/17  Yes Allie Bossier, MD  albuterol (PROVENTIL HFA;VENTOLIN HFA) 108 (90 Base) MCG/ACT inhaler Inhale 2 puffs into the lungs every 6 (six) hours as needed for wheezing or shortness of breath.   Yes [provider]  amLODipine (NORVASC) 10 MG tablet Take 1 tablet (10 mg total) by mouth daily. 04/24/17  Yes Allie Bossier, MD  apixaban (ELIQUIS) 2.5 MG TABS tablet Take 1 tablet (2.5 mg total) by mouth 2 (two) times daily. 09/04/17  Yes Shirley Friar, PA-C  aspirin EC 81 MG EC tablet Take 1 tablet (81 mg total) by mouth daily. 04/24/17  Yes Allie Bossier, MD  atorvastatin (LIPITOR) 40 MG tablet Take 1 tablet (40 mg total) by mouth daily. 04/24/17  Yes Allie Bossier, MD  b complex vitamins tablet Take 1 tablet by mouth daily.   Yes [provider]  buPROPion (WELLBUTRIN) 100 MG tablet Take 100 mg by mouth 3 (three) times daily.   Yes [provider]  busPIRone (BUSPAR) 15 MG tablet Take 1 tablet (15 mg total) by mouth 2 (two) times daily. Patient taking differently: Take 15 mg by mouth 3 (three) times daily.  04/23/17  Yes Allie Bossier, MD  carvedilol (COREG) 6.25 MG tablet Take 1 tablet (6.25 mg total) by mouth 2 (two) times daily with a meal. Patient taking differently: Take 25 mg by mouth 2 (two) times daily with a meal.  04/23/17  Yes Allie Bossier, MD  cholecalciferol (VITAMIN D) 1000 units tablet Take 1,000 Units by mouth daily.    Yes [provider]  docusate sodium (COLACE) 100 MG capsule Take 1 capsule (100 mg total) by mouth 2 (two) times daily. 04/23/17  Yes Allie Bossier, MD  doxazosin (CARDURA) 4 MG tablet Take 4 mg by mouth daily.   Yes [provider]  DULoxetine (CYMBALTA) 30 MG capsule Take 1 capsule (30 mg total) by mouth daily. 04/24/17  Yes Allie Bossier, MD  furosemide (LASIX) 80 MG tablet Take 1.5 tablets (120 mg total) by mouth 2 (two) times daily. 04/28/17  Yes Arbutus Leas, NP  hydrALAZINE (APRESOLINE) 100 MG tablet Take 1 tablet (100 mg total) by mouth every 8 (eight) hours. 04/23/17  Yes Allie Bossier, MD  isosorbide mononitrate (IMDUR) 120 MG 24 hr tablet Take 1 tablet (120 mg total) by mouth daily. 04/24/17  Yes Allie Bossier, MD  levothyroxine (SYNTHROID, LEVOTHROID) 112 MCG tablet Take 1 tablet (112 mcg total) by mouth daily before breakfast. 04/24/17  Yes Allie Bossier, MD  magnesium gluconate (MAGONATE) 500 MG tablet Take 0.5 tablets (250 mg total) by mouth daily. 04/24/17  Yes Allie Bossier, MD  montelukast (SINGULAIR) 10 MG tablet Take 1 tablet (10 mg total) by mouth daily. 04/24/17  Yes Allie Bossier, MD  nitroGLYCERIN (NITROSTAT) 0.4 MG SL tablet Place 1 tablet (0.4 mg total) under the tongue  every 5 (five) minutes as needed for chest pain. 04/23/17  Yes Allie Bossier, MD  Nutritional Supplements (FEEDING SUPPLEMENT, NEPRO CARB STEADY,) LIQD Take 237 mLs by mouth 2 (two) times daily between meals. 04/23/17  Yes Allie Bossier, MD  oxyCODONE (OXY IR/ROXICODONE) 5 MG immediate release tablet Take 1 tablet (5 mg total) by mouth every 8 (eight) hours as needed (pain). 04/23/17  Yes Allie Bossier, MD  pantoprazole (PROTONIX) 40 MG tablet Take 1 tablet (40 mg total) by mouth 2 (two) times daily before a meal. 04/23/17  Yes Allie Bossier, MD  polyethylene glycol [facility] Floria Raveling) packet Take 17 g by mouth daily as needed (constipation).   Yes [provider]  potassium chloride 20 MEQ TBCR Take 20 mEq by mouth daily. 07/23/17 10/21/17 Yes Larey Dresser, MD  buPROPion (WELLBUTRIN XL) 300 MG 24 hr tablet Take 1 tablet (300 mg total) by mouth daily. Patient not taking: Reported on 10/08/2017 04/24/17   Allie Bossier, MD  Darbepoetin Alfa (ARANESP) 40 MCG/0.4ML SOSY injection Inject 0.4 mLs (40 mcg total) into the skin every Tuesday at 6 PM. Patient not taking: Reported on 07/03/2017 04/28/17   Allie Bossier, MD  doxazosin (CARDURA) 2 MG tablet Take 1 tablet (2 mg total) by mouth daily. Patient not taking: Reported on 10/08/2017 04/24/17   Allie Bossier, MD  ferrous sulfate 325 (65 FE) MG tablet Take 1 tablet (325 mg total) by mouth 2 (two) times daily with a meal. Patient not taking: Reported on 10/08/2017 04/23/17   Allie Bossier, MD  lidocaine (LIDODERM) 5 % Place 1 patch onto the skin daily. Remove & Discard patch within 12 hours or as directed by MD Patient not taking: Reported on 09/04/2017 04/24/17   Allie Bossier, MD  ondansetron (ZOFRAN) 4 MG tablet Take 1 tablet (4 mg total) by mouth every 8 (eight) hours as needed for nausea or vomiting. 04/23/17 04/23/18  Allie Bossier, MD    Past Medical History: Past Medical History:  Diagnosis Date  . Anxiety   . Arthritis   . Bronchitis   . CKD (chronic kidney disease) stage 4, GFR 15-29 ml/min (HCC) 03/16/2017  . Depression   . Diabetes mellitus without complication ([place])   . Diet-controlled diabetes mellitus ([place])   . GERD (gastroesophageal reflux disease)   . Hyperlipidemia   . Hypertension   . Hypothyroidism   . Stroke [facility])     Past Surgical History: Past Surgical History:  Procedure Laterality Date  . ABDOMINAL HYSTERECTOMY     PARTIALS  . IR NEPHRO TUBE REMOV/FL  04/02/2017  . IR NEPHROSTOGRAM RIGHT THRU EXISTING ACCESS  04/02/2017  . IR NEPHROSTOMY PLACEMENT RIGHT  03/27/2017  . IR THORACENTESIS ASP PLEURAL SPACE W/IMG GUIDE  03/24/2017  . RIGHT/LEFT HEART  CATH AND CORONARY ANGIOGRAPHY N/A 07/03/2017   Procedure: RIGHT/LEFT HEART CATH AND CORONARY ANGIOGRAPHY;  Surgeon: Larey Dresser, MD;  Location: [facility] LAB;  Service: Cardiovascular;  Laterality: N/A;  . TEE WITHOUT CARDIOVERSION N/A 10/08/2017   Procedure: TRANSESOPHAGEAL ECHOCARDIOGRAM (TEE);  Surgeon: Larey Dresser, MD;  Location: [facility] ENDOSCOPY;  Service: Cardiovascular;  Laterality: N/A;  . TONSILLECTOMY    . TUBAL LIGATION      Family History:  Family History  Problem Relation Age of Onset  . Hypertension Mother     Social History: Social History   Socioeconomic History  . Marital status: Divorced    Spouse name: Not  on file  . Number of children: Not on file  . Years of education: Not on file  . Highest education level: Not on file  Occupational History  . Not on file  Social Needs  . Financial resource strain: Not on file  . Food insecurity:    Worry: Not on file    Inability: Not on file  . Transportation needs:    Medical: Not on file    Non-medical: Not on file  Tobacco Use  . Smoking status: Former Smoker    Types: Cigarettes  . Smokeless tobacco: Never Used  Substance and Sexual Activity  . Alcohol use: No  . Drug use: No  . Sexual activity: Never  Lifestyle  . Physical activity:    Days per week: Not on file    Minutes per session: Not on file  . Stress: Not on file  Relationships  . Social connections:    Talks on phone: Not on file    Gets together: Not on file    Attends religious service: Not on file    Active member of club or organization: Not on file    Attends meetings of clubs or organizations: Not on file    Relationship status: Not on file  Other Topics Concern  . Not on file  Social History Narrative  . Not on file    Allergies:  Allergies  Allergen Reactions  . Ace Inhibitors Anaphylaxis and Swelling  . Motrin Ib [Ibuprofen] Anaphylaxis    Objective:    Vital Signs:       Filed Weights   10/08/17 0740    Weight: 133 lb (60.3 kg)     Physical Exam     General:  Well appearing. No respiratory difficulty HEENT: Normal Neck: Supple. no JVD. Carotids 2+ bilat; no bruits. No lymphadenopathy or thyromegaly appreciated. Cor: PMI nondisplaced. Regular rate & rhythm. No rubs, gallops.  3/6 HSM apex.  Lungs: Clear Abdomen: Soft, nontender, nondistended. No hepatosplenomegaly. No bruits or masses. Good bowel sounds. Extremities: No cyanosis, clubbing, rash, edema Neuro: Alert & oriented x 3, cranial nerves grossly intact. moves all 4 extremities w/o difficulty. Affect pleasant.   Labs     Basic Metabolic Panel: No results for input(s): NA, K, CL, CO2, GLUCOSE, BUN, CREATININE, CALCIUM, MG, PHOS in the last 168 hours.  Liver Function Tests: No results for input(s): AST, ALT, ALKPHOS, BILITOT, PROT, ALBUMIN in the last 168 hours. No results for input(s): LIPASE, AMYLASE in the last 168 hours. No results for input(s): AMMONIA in the last 168 hours.  CBC: No results for input(s): WBC, NEUTROABS, HGB, HCT, MCV, PLT in the last 168 hours.  Cardiac Enzymes: No results for input(s): CKTOTAL, CKMB, CKMBINDEX, TROPONINI in the last 168 hours.  BNP: BNP (last 3 results) Recent Labs    03/12/17 1400  BNP 2,230.5*    ProBNP (last 3 results) No results for input(s): PROBNP in the last 8760 hours.   CBG: No results for input(s): GLUCAP in the last 168 hours.  Coagulation Studies: No results for input(s): LABPROT, INR in the last 72 hours.  Imaging:  No results found.   Assessment/Plan   Patient presents for TEE for mitral regurgitation.  No change in clinical situation since 3/8 note.    [name], MD 10/13/2017, 9:46 AM  Advanced Heart Failure Team Pager [phone] (M-F; 7a - 4p)  Please contact [facility] for night-coverage after hours (4p -7a ) and weekends on [email or web address]

## 2017-10-14 ENCOUNTER — Other Ambulatory Visit: Payer: Self-pay

## 2017-10-14 DIAGNOSIS — Z01812 Encounter for preprocedural laboratory examination: Secondary | ICD-10-CM

## 2017-10-14 DIAGNOSIS — N185 Chronic kidney disease, stage 5: Secondary | ICD-10-CM

## 2017-10-16 NOTE — Pre-Procedure Instructions (Signed)
Jocelyn Hill  10/16/2017      [facility], [place] - [address] AT [address] [place] [address] Phone: [phone] Fax: [phone]    Your procedure is scheduled on  Thursday 10/22/17  Report to [facility] Admitting at 800 A.M.  Call this number if you have problems the morning of surgery:  [phone]   Remember:  Do not eat food or drink liquids after midnight.  Take these medicines the morning of surgery with A SIP OF WATER - ALBUTEROL IF NEEDED, AMLODIPINE (NORVASC), BUPROPION(WELLBUTRIN), BUSPIRONE (BUSPAR), CARVEDILOL (COREG), DOXAZOSIN (CARDURA) ,DULOXETINE (CYMBALTA), HYDRALAZINE, ISOSORBIDE MONONITRATE (IMDUR), LEVOTHYROXINE, SINGULAIR, OXYCODONE IF NEEDED, PANTOPRAZOLE (PROTONIX)  7 days prior to surgery STOP taking any Aspirin(unless otherwise instructed by your surgeon), Aleve, Naproxen, Ibuprofen, Motrin, Advil, Goody's, BC's, all herbal medications, fish oil, and all vitamins   Do not wear jewelry, make-up or nail polish.  Do not wear lotions, powders, or perfumes, or deodorant.  Do not shave 48 hours prior to surgery.  Men may shave face and neck.  Do not bring valuables to the hospital.  [facility] is not responsible for any belongings or valuables.  Contacts, dentures or bridgework may not be worn into surgery.  Leave your suitcase in the car.  After surgery it may be brought to your room.  For patients admitted to the hospital, discharge time will be determined by your treatment team.  Patients discharged the day of surgery will not be allowed to drive home.   Name and phone number of your driver:    Special instructions:  [place] - Preparing for Surgery  Before surgery, you can play an important role.  Because skin is not sterile, your skin needs to be as free of germs as possible.  You can reduce the number of germs on you skin by washing with CHG  (chlorahexidine gluconate) soap before surgery.  CHG is an antiseptic cleaner which kills germs and bonds with the skin to continue killing germs even after washing.  Please DO NOT use if you have an allergy to CHG or antibacterial soaps.  If your skin becomes reddened/irritated stop using the CHG and inform your nurse when you arrive at Short Stay.  Do not shave (including legs and underarms) for at least 48 hours prior to the first CHG shower.  You may shave your face.  Please follow these instructions carefully:   1.  Shower with CHG Soap the night before surgery and the                                morning of Surgery.  2.  If you choose to wash your hair, wash your hair first as usual with your       normal shampoo.  3.  After you shampoo, rinse your hair and body thoroughly to remove the                      Shampoo.  4.  Use CHG as you would any other liquid soap.  You can apply chg directly       to the skin and wash gently with scrungie or a clean washcloth.  5.  Apply the CHG Soap to your body ONLY FROM THE NECK DOWN.        Do not  use on open wounds or open sores.  Avoid contact with your eyes,       ears, mouth and genitals (private parts).  Wash genitals (private parts)       with your normal soap.  6.  Wash thoroughly, paying special attention to the area where your surgery        will be performed.  7.  Thoroughly rinse your body with warm water from the neck down.  8.  DO NOT shower/wash with your normal soap after using and rinsing off       the CHG Soap.  9.  Pat yourself dry with a clean towel.            10.  Wear clean pajamas.            11.  Place clean sheets on your bed the night of your first shower and do not        sleep with pets.  Day of Surgery  Do not apply any lotions/deoderants the morning of surgery.  Please wear clean clothes to the hospital/surgery center.     Please read over the following fact sheets that you were given. Pain Booklet

## 2017-10-19 ENCOUNTER — Encounter (HOSPITAL_COMMUNITY)
Admission: RE | Admit: 2017-10-19 | Discharge: 2017-10-19 | Disposition: A | Discharge status: Home / Self Care | Payer: Medicare Other | Source: Ambulatory Visit | Attending: Dentistry | Admitting: Dentistry

## 2017-10-19 ENCOUNTER — Emergency Department (HOSPITAL_COMMUNITY): Payer: Medicare Other

## 2017-10-19 ENCOUNTER — Other Ambulatory Visit: Payer: Self-pay

## 2017-10-19 ENCOUNTER — Encounter (HOSPITAL_COMMUNITY): Payer: Self-pay | Admitting: Emergency Medicine

## 2017-10-19 ENCOUNTER — Encounter (HOSPITAL_COMMUNITY): Payer: Self-pay

## 2017-10-19 ENCOUNTER — Emergency Department (HOSPITAL_COMMUNITY)
Admission: EM | Admit: 2017-10-19 | Discharge: 2017-10-19 | Disposition: A | Discharge status: Home / Self Care | Payer: Medicare Other | Attending: Emergency Medicine | Admitting: Emergency Medicine

## 2017-10-19 DIAGNOSIS — R079 Chest pain, unspecified: Secondary | ICD-10-CM

## 2017-10-19 DIAGNOSIS — Z5321 Procedure and treatment not carried out due to patient leaving prior to being seen by health care provider: Secondary | ICD-10-CM | POA: Insufficient documentation

## 2017-10-19 DIAGNOSIS — Z01812 Encounter for preprocedural laboratory examination: Secondary | ICD-10-CM | POA: Insufficient documentation

## 2017-10-19 DIAGNOSIS — Z01810 Encounter for preprocedural cardiovascular examination: Secondary | ICD-10-CM | POA: Insufficient documentation

## 2017-10-19 LAB — CBC
HCT: 39.6 % (ref 36.0–46.0)
Hemoglobin: 13.8 g/dL (ref 12.0–15.0)
MCH: 27.1 pg (ref 26.0–34.0)
MCHC: 34.8 g/dL (ref 30.0–36.0)
MCV: 77.6 fL — ABNORMAL LOW (ref 78.0–100.0)
Platelets: 228 10*3/uL (ref 150–400)
RBC: 5.1 MIL/uL (ref 3.87–5.11)
RDW: 13.6 % (ref 11.5–15.5)
WBC: 7.9 10*3/uL (ref 4.0–10.5)

## 2017-10-19 LAB — BASIC METABOLIC PANEL
Anion gap: 11 (ref 5–15)
BUN: 37 mg/dL — ABNORMAL HIGH (ref 6–20)
CO2: 21 mmol/L — ABNORMAL LOW (ref 22–32)
Calcium: 10.3 mg/dL (ref 8.9–10.3)
Chloride: 106 mmol/L (ref 101–111)
Creatinine, Ser: 3.97 mg/dL — ABNORMAL HIGH (ref 0.44–1.00)
GFR calc Af Amer: 13 mL/min — ABNORMAL LOW (ref >60)
GFR calc non Af Amer: 11 mL/min — ABNORMAL LOW (ref >60)
Glucose, Bld: 115 mg/dL — ABNORMAL HIGH (ref 65–99)
Potassium: 3.9 mmol/L (ref 3.5–5.1)
Sodium: 138 mmol/L (ref 135–145)

## 2017-10-19 LAB — I-STAT TROPONIN, ED: Troponin i, poc: 0.02 ng/mL (ref 0.00–0.08)

## 2017-10-19 LAB — GLUCOSE, CAPILLARY: Glucose-Capillary: 118 mg/dL — ABNORMAL HIGH (ref 65–99)

## 2017-10-19 NOTE — ED Notes (Signed)
Pt was here for pre-op for dental surgery, EKG was completed in preop. Copy with patient.

## 2017-10-19 NOTE — Progress Notes (Signed)
Anesthesia Consult:   Called to see pt in pre-admission testing.  Pt c/o chest pain since yesterday; has taken nitro x2 last night for temporary improvement but not resolution of symptoms.  Pt describes pain as "achy" L sided and central chest pain.  She reports it is her usual chest pain which she gets frequently, however, her chest pain is usually relieved by nitro and this pain is not resolving.    Pt has known 2 vessel CAD, and needs a CABG + MVR due to severe MR.  CT surgeon is in the OR and unavailable.  HF cardiologist Dr. Aundra Dubin is out of the office this week.  Given pt's symptoms and known heart disease, rapid response called and pt transported to ED.   Willeen Cass, FNP-BC [facility] Short Stay Surgical Center/Anesthesiology Phone: [phone] 10/19/2017 11:53 AM

## 2017-10-19 NOTE — ED Notes (Signed)
Pt called for troponin blood draw. No response

## 2017-10-19 NOTE — Progress Notes (Addendum)
Pre-op questionnaire and instructions not completed due to pt having a known cardiac history and having had chest pain since yesterday. Pain was unrelieved with Nitro x2. Pain at PAT appointment 4/10. NP Levada Dy from anesthesia made aware, and EKG performed. Pt denies SOB, N/V. Rapid response came to take the pt to the ER for further evaluation.

## 2017-10-19 NOTE — ED Notes (Signed)
Last call for pt in lobby. No response.

## 2017-10-19 NOTE — ED Notes (Signed)
Pt states that she needs to leave because she has kids coming to her house and she needs to be there.

## 2017-10-19 NOTE — Progress Notes (Signed)
Message left for Dr. Ritta Slot office regarding the pt not completing her pre-op appointment due to chest pain, as well as an IB sent to Dr. Enrique Sack.

## 2017-10-19 NOTE — Progress Notes (Signed)
Jocelyn Hill            10/16/2017                          [facility], [place] [address] [place] [address] Phone: [phone] Fax: [phone]              Your procedure is scheduled on  Thursday 10/22/17            Report to [facility] Admitting at 800 A.M.            Call this number if you have problems the morning of surgery:            [phone]             Remember:            Do not eat food or drink liquids after midnight.            Take these medicines the morning of surgery with A SIP OF WATER - ALBUTEROL IF NEEDED, AMLODIPINE (NORVASC), BUPROPION(WELLBUTRIN), BUSPIRONE (BUSPAR), CARVEDILOL (COREG), DOXAZOSIN (CARDURA) ,DULOXETINE (CYMBALTA), HYDRALAZINE, ISOSORBIDE MONONITRATE (IMDUR), LEVOTHYROXINE, SINGULAIR, OXYCODONE IF NEEDED, PANTOPRAZOLE (PROTONIX)  7 days prior to surgery STOP taking any Aspirin(unless otherwise instructed by your surgeon), Aleve, Naproxen, Ibuprofen, Motrin, Advil, Goody's, BC's, all herbal medications, fish oil, and all vitamins  How to Manage Your Diabetes Before and After Surgery  Why is it important to control my blood sugar before and after surgery? . Improving blood sugar levels before and after surgery helps healing and can limit problems. . A way of improving blood sugar control is eating a healthy diet by: o  Eating less sugar and carbohydrates o  Increasing activity/exercise o  Talking with your doctor about reaching your blood sugar goals . High blood sugars (greater than 180 mg/dL) can raise your risk of infections and slow your recovery, so you will need to focus on controlling your diabetes during the weeks before surgery. . Make sure that the doctor who takes care of your diabetes knows about your planned surgery including the date and location.  How do I manage my blood sugar before surgery? . Check your  blood sugar at least 4 times a day, starting 2 days before surgery, to make sure that the level is not too high or low. o Check your blood sugar the morning of your surgery when you wake up and every 2 hours until you get to the Short Stay unit. . If your blood sugar is less than 70 mg/dL, you will need to treat for low blood sugar: o Do not take insulin. o Treat a low blood sugar (less than 70 mg/dL) with  cup of clear juice (cranberry or apple), 4 glucose tablets, OR glucose gel. Recheck blood sugar in 15 minutes after treatment (to make sure it is greater than 70 mg/dL). If your blood sugar is not greater than 70 mg/dL on recheck, call [phone] o  for further instructions. . Report your blood sugar to the short stay nurse when you get to Short Stay.  . If you are admitted to the hospital after surgery: o Your blood sugar will be checked by the staff and you will probably be given insulin after surgery (instead of oral diabetes medicines) to make sure you have good blood sugar levels.  o The goal for blood sugar control after surgery is 80-180 mg/dL.             Do not wear jewelry, make-up or nail polish.            Do not wear lotions, powders, or perfumes, or deodorant.            Do not shave 48 hours prior to surgery.  Men may shave face and neck.            Do not bring valuables to the hospital.            [facility] is not responsible for any belongings or valuables.  Contacts, dentures or bridgework may not be worn into surgery.  Leave your suitcase in the car.  After surgery it may be brought to your room.  For patients admitted to the hospital, discharge time will be determined by your treatment team.  Patients discharged the day of surgery will not be allowed to drive home.   Name and phone number of your driver:    Special instructions:  [place] - Preparing for Surgery  Before surgery, you can play an important role.  Because skin is not sterile, your skin  needs to be as free of germs as possible.  You can reduce the number of germs on you skin by washing with CHG (chlorahexidine gluconate) soap before surgery.  CHG is an antiseptic cleaner which kills germs and bonds with the skin to continue killing germs even after washing.  Please DO NOT use if you have an allergy to CHG or antibacterial soaps.  If your skin becomes reddened/irritated stop using the CHG and inform your nurse when you arrive at Short Stay.  Do not shave (including legs and underarms) for at least 48 hours prior to the first CHG shower.  You may shave your face.  Please follow these instructions carefully:             1.  Shower with CHG Soap the night before surgery and the                                       morning of Surgery.            2.  If you choose to wash your hair, wash your hair first as usual with your                normal shampoo.            3.  After you shampoo, rinse your hair and body thoroughly to remove the                             Shampoo.            4.  Use CHG as you would any other liquid soap.  You can apply chg directly               to the skin and wash gently with scrungie or a clean washcloth.            5.  Apply the CHG Soap to your body ONLY FROM THE NECK DOWN.          Do not use on open wounds or open sores.  Avoid contact with your eyes,  ears, mouth and genitals (private parts).  Wash genitals (private parts)        with your normal soap.            6.  Wash thoroughly, paying special attention to the area where your surgery               will be performed.            7.  Thoroughly rinse your body with warm water from the neck down.            8.  DO NOT shower/wash with your normal soap after using and rinsing off                the CHG Soap.            9.  Pat yourself dry with a clean towel.            10.  Wear clean pajamas.            11.  Place clean sheets on your bed the night of your first shower and do not            sleep with pets.  Day of Surgery  Do not apply any lotions/deoderants the morning of surgery.  Please wear clean clothes to the hospital/surgery center.  Please read over the following fact sheets that you were given. Pain Booklet

## 2017-10-19 NOTE — ED Triage Notes (Signed)
Pt to ER for chest pain not resolved with nitro. Reports chronic chest pain but is normally relieved with her nitro. Has taken 2 nitro this morning without relief. Pt in NAD. Reports associated shortness of breath. Reports radiation to back.

## 2017-10-22 ENCOUNTER — Encounter (HOSPITAL_COMMUNITY): Admission: RE | Payer: Self-pay | Source: Ambulatory Visit

## 2017-10-22 ENCOUNTER — Ambulatory Visit (HOSPITAL_COMMUNITY): Admission: RE | Admit: 2017-10-22 | Payer: Medicare Other | Source: Ambulatory Visit | Admitting: Dentistry

## 2017-10-22 SURGERY — MULTIPLE EXTRACTION WITH ALVEOLOPLASTY
Anesthesia: General

## 2017-10-26 ENCOUNTER — Encounter (HOSPITAL_COMMUNITY): Payer: Self-pay | Admitting: Cardiology

## 2017-10-26 ENCOUNTER — Ambulatory Visit (HOSPITAL_COMMUNITY)
Admission: RE | Admit: 2017-10-26 | Discharge: 2017-10-26 | Disposition: A | Discharge status: Home / Self Care | Payer: Medicare Other | Source: Ambulatory Visit | Attending: Cardiology | Admitting: Cardiology

## 2017-10-26 ENCOUNTER — Other Ambulatory Visit: Payer: Self-pay

## 2017-10-26 VITALS — BP 141/83 | HR 58 | Wt 136.5 lb

## 2017-10-26 DIAGNOSIS — I480 Paroxysmal atrial fibrillation: Secondary | ICD-10-CM | POA: Diagnosis not present

## 2017-10-26 DIAGNOSIS — N183 Chronic kidney disease, stage 3 unspecified: Secondary | ICD-10-CM

## 2017-10-26 DIAGNOSIS — R0789 Other chest pain: Secondary | ICD-10-CM | POA: Diagnosis not present

## 2017-10-26 DIAGNOSIS — Z8673 Personal history of transient ischemic attack (TIA), and cerebral infarction without residual deficits: Secondary | ICD-10-CM | POA: Insufficient documentation

## 2017-10-26 DIAGNOSIS — Z79891 Long term (current) use of opiate analgesic: Secondary | ICD-10-CM | POA: Diagnosis not present

## 2017-10-26 DIAGNOSIS — I051 Rheumatic mitral insufficiency: Secondary | ICD-10-CM | POA: Diagnosis not present

## 2017-10-26 DIAGNOSIS — Z7982 Long term (current) use of aspirin: Secondary | ICD-10-CM | POA: Diagnosis not present

## 2017-10-26 DIAGNOSIS — N184 Chronic kidney disease, stage 4 (severe): Secondary | ICD-10-CM | POA: Insufficient documentation

## 2017-10-26 DIAGNOSIS — I2510 Atherosclerotic heart disease of native coronary artery without angina pectoris: Secondary | ICD-10-CM | POA: Diagnosis not present

## 2017-10-26 DIAGNOSIS — Z8249 Family history of ischemic heart disease and other diseases of the circulatory system: Secondary | ICD-10-CM | POA: Insufficient documentation

## 2017-10-26 DIAGNOSIS — N1330 Unspecified hydronephrosis: Secondary | ICD-10-CM | POA: Diagnosis not present

## 2017-10-26 DIAGNOSIS — F329 Major depressive disorder, single episode, unspecified: Secondary | ICD-10-CM | POA: Insufficient documentation

## 2017-10-26 DIAGNOSIS — R001 Bradycardia, unspecified: Secondary | ICD-10-CM | POA: Insufficient documentation

## 2017-10-26 DIAGNOSIS — F419 Anxiety disorder, unspecified: Secondary | ICD-10-CM | POA: Insufficient documentation

## 2017-10-26 DIAGNOSIS — Z886 Allergy status to analgesic agent status: Secondary | ICD-10-CM | POA: Insufficient documentation

## 2017-10-26 DIAGNOSIS — Z951 Presence of aortocoronary bypass graft: Secondary | ICD-10-CM | POA: Insufficient documentation

## 2017-10-26 DIAGNOSIS — Z888 Allergy status to other drugs, medicaments and biological substances status: Secondary | ICD-10-CM | POA: Insufficient documentation

## 2017-10-26 DIAGNOSIS — I252 Old myocardial infarction: Secondary | ICD-10-CM | POA: Diagnosis not present

## 2017-10-26 DIAGNOSIS — Z79899 Other long term (current) drug therapy: Secondary | ICD-10-CM | POA: Diagnosis not present

## 2017-10-26 DIAGNOSIS — I130 Hypertensive heart and chronic kidney disease with heart failure and stage 1 through stage 4 chronic kidney disease, or unspecified chronic kidney disease: Secondary | ICD-10-CM | POA: Insufficient documentation

## 2017-10-26 DIAGNOSIS — Z7901 Long term (current) use of anticoagulants: Secondary | ICD-10-CM | POA: Diagnosis not present

## 2017-10-26 DIAGNOSIS — I340 Nonrheumatic mitral (valve) insufficiency: Secondary | ICD-10-CM | POA: Insufficient documentation

## 2017-10-26 DIAGNOSIS — K219 Gastro-esophageal reflux disease without esophagitis: Secondary | ICD-10-CM | POA: Diagnosis not present

## 2017-10-26 DIAGNOSIS — E1122 Type 2 diabetes mellitus with diabetic chronic kidney disease: Secondary | ICD-10-CM | POA: Insufficient documentation

## 2017-10-26 DIAGNOSIS — I5032 Chronic diastolic (congestive) heart failure: Secondary | ICD-10-CM | POA: Diagnosis not present

## 2017-10-26 DIAGNOSIS — E785 Hyperlipidemia, unspecified: Secondary | ICD-10-CM | POA: Diagnosis not present

## 2017-10-26 DIAGNOSIS — Z87891 Personal history of nicotine dependence: Secondary | ICD-10-CM | POA: Insufficient documentation

## 2017-10-26 LAB — BASIC METABOLIC PANEL
Anion gap: 9 (ref 5–15)
BUN: 31 mg/dL — ABNORMAL HIGH (ref 6–20)
CO2: 26 mmol/L (ref 22–32)
Calcium: 9.6 mg/dL (ref 8.9–10.3)
Chloride: 105 mmol/L (ref 101–111)
Creatinine, Ser: 3.76 mg/dL — ABNORMAL HIGH (ref 0.44–1.00)
GFR calc Af Amer: 13 mL/min — ABNORMAL LOW (ref >60)
GFR calc non Af Amer: 12 mL/min — ABNORMAL LOW (ref >60)
Glucose, Bld: 85 mg/dL (ref 65–99)
Potassium: 4 mmol/L (ref 3.5–5.1)
Sodium: 140 mmol/L (ref 135–145)

## 2017-10-26 LAB — TSH: TSH: 2.884 u[IU]/mL (ref 0.350–4.500)

## 2017-10-26 MED ORDER — DOXAZOSIN MESYLATE 8 MG PO TABS: 8.0000 mg | ORAL_TABLET | Freq: Every day | ORAL | 6 refills | Status: DC

## 2017-10-26 NOTE — Patient Instructions (Signed)
Increase Doxazosin to 8 mg daily  Labs today  You have been referred to Dr Enrique Sack for dental surgery, his office will call you  Your physician recommends that you schedule a follow-up appointment in: 2 months

## 2017-10-27 NOTE — Progress Notes (Signed)
Advanced Heart Failure Clinic Note   PCP: Dr. Dorita Fray ([place]) HF Cardiology: Dr. Aundra Dubin  HPI: [name]. Jocelyn Hill is a 67 y.o. female with h/o mitral regurgitation, chronic diastolic CHF, CAD s/p MI, long standing HTN, DM2, CKD IV-V, h/o Stroke. Anxiety, depression and GERD. Patient reported that she has had 2 MIs treated at [facility] in [place] several years ago.   Presented to [facility] on 03/12/2017 with acute hypoxic respiratory failure. ECHO showed normal LV function with severe MR. Diuresed with IV lasix over 20 pounds. She was being considered for MVR by Dr Roxy Manns but due to multiple complications she was not a candidate. Hospital course was complicated by acute respiratory failure, acute renal failure, atrial fibrillation, retroperitoneal bleed, r hydronephrosis (perc tube) and E coli bacteremia. She was started on anticoagulation for atrial fibrillation but this was stopped with RP hematoma.  She converted to NSR on amiodarone but developed junctional rhythm so amiodarone was stopped. Creatinine peaked at 5.  She did not require dialysis and gradually renal function improved. Discharged to SNF without diuretics. Discharge weight was 124 pounds.   Admitted again 04/12/17-04/23/17 with respiratory failure from [facility]. She had gained 16 pounds at SNF as she was not on diuretic.  CXR with bilateral infiltrates. Placed on vanc and zosyn. Pertinent admission labs included BNP > 3000, creatinine 3.75, K 4.1, WBC 16.1, procalcitonin 2.06, and troponin 0.04. Requiring 100% high flow oxygen. Blood CX - NGTD. Diuresing with high dose IV lasix.  Nephrology consulted, renal US with no evidence of hydronephrosis (pt had percutaneous nephrostomy drain the month prior due to this). Also placed on NTG drip for HTN and CP. She was seen by Dr. Roxy Manns again and felt still to not be a MVR candidate. Discharged on Lasix 160 mg BID. Discharge weight was 114 pounds.   TTE 03/12/17 LVEF 60-65%,  Grade 2 DD, Severe MR, Severe LAE, Mild RAE, Mild/Mod TR, PA peak pressure 60 mm hg, small/mod pericardial effusion.  TEE 03/17/17 LVEF 55-60%, Trivial TR, Severe MR, mild LAE, mild mod TR. No evidence of vegetation.  LHC/RHC in 1/19 showed 90% ostial LCx, 60% mid LAD.   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   She returns today for followup of CHF and CAD.  She is awaiting MVR/CABG.  She still needs teeth removed.  She is nearing HD and probably should start HD prior to surgery. She follows with Dr. Hollie Salk and will see her soon.  She remains short of breath walking short distances, very short of breath with stairs. She has 4 pillow orthopnea.  She has chest pain on most days, but it is not exertional (occurs randomly).  Symptoms have been stable.   Labs (10/18): K 3.8, creatinine 2.88 Labs (12/18): LDL 64, HDL 66 Labs (3/19): hgb 11.4 Labs (4/19): K 3.9, creatinine 3.97  Review of Systems: All systems reviewed and negative except as per HPI.   PMH: 1. Chronic diastolic CHF:  - TTE 01/06/61 LVEF 60-65%, Grade 2 DD, Severe MR, Severe LAE, Mild RAE, Mild/Mod TR, PA peak pressure 60 mm hg, small/mod pericardial effusion. - RHC (1/19): mean RA 2, PA 37/12 mean 26, mean PCWP 13, CI 2.98 2. CKD: stage IV.  3. Anxiety/panic attacks 4. DM 5. HTN 6. CAD: Patient reports prior MI treated at [facility].  No records available - LHC in 1/19 showed 90% ostial LCx, 60% mid  LAD.  7. Retroperitoneal hematoma: With anticoagulation in 9/18.  8. Hydronephrosis due to RP hematoma: Resolved.  9. GERD 10. Hyperlipidemia 11. H/o CVA 12. 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  - TEE 03/17/17 LVEF 55-60%, Trivial TR, Severe MR, mild LAE, mild mod TR. No evidence of vegetation. 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  13. Atrial fibrillation: Paroxysmal.  Noted in 9/18, not anticoagulated currently due to RP hematoma.  - Junctional rhythm on amiodarone, amiodarone stopped.   Current Outpatient Medications  Medication Sig Dispense Refill  . acetaminophen (TYLENOL) 325 MG tablet Take 2 tablets (650 mg total) by mouth every 6 (six) hours as needed for mild pain, fever or headache. 30 tablet 0  . albuterol (PROVENTIL HFA;VENTOLIN HFA) 108 (90 Base) MCG/ACT inhaler Inhale 2 puffs into the lungs every 6 (six) hours as needed for wheezing or shortness of breath.    Marland Kitchen amLODipine (NORVASC) 10 MG tablet Take 1 tablet (10 mg total) by mouth daily. 30 tablet 0  . apixaban (ELIQUIS) 2.5 MG TABS tablet Take 1 tablet (2.5 mg total) by mouth 2 (two) times daily. 60 tablet 11  . aspirin EC 81 MG EC tablet Take 1 tablet (81 mg total) by mouth daily. 30 tablet 0  . atorvastatin (LIPITOR) 40 MG tablet Take 1 tablet (40 mg total) by mouth daily. 30 tablet 0  . b complex vitamins tablet Take 1 tablet by mouth daily.    Marland Kitchen buPROPion (WELLBUTRIN) 100 MG tablet Take 100 mg by mouth 3 (three) times daily.    . busPIRone (BUSPAR) 15 MG tablet Take 1 tablet (15 mg total) by mouth 2 (two) times daily. 60 tablet 0  . carvedilol (COREG) 25 MG tablet Take 25 mg by mouth 2 (two) times daily with a meal.    . cholecalciferol (VITAMIN D) 1000 units tablet Take 1,000 Units by mouth daily.    . Darbepoetin Alfa (ARANESP) 40 MCG/0.4ML SOSY injection Inject 0.4 mLs (40 mcg total) into the skin every Tuesday at 6 PM. 8.4 mL 0  . docusate sodium (COLACE) 100 MG capsule Take 1 capsule (100 mg total) by mouth 2 (two) times daily. (Patient taking differently: Take 100 mg by mouth daily. ) 10 capsule 0  . doxazosin (CARDURA) 8 MG tablet Take 1 tablet (8 mg total) by mouth daily. 30 tablet 6  . DULoxetine (CYMBALTA) 30 MG capsule Take 1 capsule (30 mg total) by mouth daily. 30 capsule 0  . ferrous  sulfate 325 (65 FE) MG tablet Take 1 tablet (325 mg total) by mouth 2 (two) times daily with a meal. 60 tablet 0  . furosemide (LASIX) 80 MG tablet Take 1.5 tablets (120 mg total) by mouth 2 (two) times daily. 90 tablet 6  . gabapentin (NEURONTIN) 300 MG capsule Take 300 mg by mouth at bedtime.  11  . hydrALAZINE (APRESOLINE) 100 MG tablet Take 1 tablet (100 mg total) by mouth every 8 (eight) hours. 90 tablet 0  . isosorbide mononitrate (IMDUR) 120 MG 24 hr tablet Take 1 tablet (120 mg total) by mouth daily. 30 tablet 0  . levothyroxine (SYNTHROID, LEVOTHROID) 112 MCG tablet Take 1 tablet (112 mcg total) by mouth daily before breakfast. 30 tablet 0  . lidocaine (LIDODERM) 5 % Place 1 patch onto the skin daily. Remove & Discard patch within 12 hours or as directed by  MD 30 patch 0  . magnesium gluconate (MAGONATE) 500 MG tablet Take 0.5 tablets (250 mg total) by mouth daily. 30 tablet 0  . montelukast (SINGULAIR) 10 MG tablet Take 1 tablet (10 mg total) by mouth daily. (Patient taking differently: Take 10 mg by mouth at bedtime. ) 30 tablet 0  . nitroGLYCERIN (NITROSTAT) 0.4 MG SL tablet Place 1 tablet (0.4 mg total) under the tongue every 5 (five) minutes as needed for chest pain. 30 tablet 0  . Nutritional Supplements (FEEDING SUPPLEMENT, NEPRO CARB STEADY,) LIQD Take 237 mLs by mouth 2 (two) times daily between meals. 237 mL 0  . ondansetron (ZOFRAN) 4 MG tablet Take 1 tablet (4 mg total) by mouth every 8 (eight) hours as needed for nausea or vomiting. 30 tablet 0  . oxyCODONE (OXY IR/ROXICODONE) 5 MG immediate release tablet Take 1 tablet (5 mg total) by mouth every 8 (eight) hours as needed (pain). 6 tablet 0  . pantoprazole (PROTONIX) 40 MG tablet Take 1 tablet (40 mg total) by mouth 2 (two) times daily before a meal. 60 tablet 0  . polyethylene glycol (MIRALAX / GLYCOLAX) packet Take 17 g by mouth daily as needed (constipation).    . potassium chloride 20 MEQ TBCR Take 20 mEq by mouth daily. 90  tablet 3   No current facility-administered medications for this encounter.     Allergies  Allergen Reactions  . Ace Inhibitors Anaphylaxis and Swelling  . Motrin Ib [Ibuprofen] Anaphylaxis      Social History   Socioeconomic History  . Marital status: Divorced    Spouse name: Not on file  . Number of children: Not on file  . Years of education: Not on file  . Highest education level: Not on file  Occupational History  . Not on file  Social Needs  . Financial resource strain: Not on file  . Food insecurity:    Worry: Not on file    Inability: Not on file  . Transportation needs:    Medical: Not on file    Non-medical: Not on file  Tobacco Use  . Smoking status: Former Smoker    Types: Cigarettes  . Smokeless tobacco: Never Used  Substance and Sexual Activity  . Alcohol use: No  . Drug use: No  . Sexual activity: Never  Lifestyle  . Physical activity:    Days per week: Not on file    Minutes per session: Not on file  . Stress: Not on file  Relationships  . Social connections:    Talks on phone: Not on file    Gets together: Not on file    Attends religious service: Not on file    Active member of club or organization: Not on file    Attends meetings of clubs or organizations: Not on file    Relationship status: Not on file  . Intimate partner violence:    Fear of current or ex partner: Not on file    Emotionally abused: Not on file    Physically abused: Not on file    Forced sexual activity: Not on file  Other Topics Concern  . Not on file  Social History Narrative  . Not on file      Family History  Problem Relation Age of Onset  . Hypertension Mother     Vitals:   10/26/17 1424  BP: (!) 141/83  Pulse: (!) 58  SpO2: 100%  Weight: 136 lb 8 oz (61.9 kg)  PHYSICAL EXAM: General: NAD Neck: No JVD, no thyromegaly or thyroid nodule.  Lungs: Clear to auscultation bilaterally with normal respiratory effort. CV: Nondisplaced PMI.  Heart  regular S1/S2, no S3/S4, 2/6 HSM apex.  1+ ankle edema.  No carotid bruit.  Normal pedal pulses.  Abdomen: Soft, nontender, no hepatosplenomegaly, no distention.  Skin: Intact without lesions or rashes.  Neurologic: Alert and oriented x 3.  Psych: Normal affect. Extremities: No clubbing or cyanosis.  HEENT: Normal.   ASSESSMENT & PLAN: 1. Chronic diastolic CHF: ECHO 09/345 EF 60-65% with severe MR. On exam today, she does not appear volume overloaded despite NYHA class IIIb symptoms (stable and likely due to severe MR). She has been taking Lasix 120 mg bid. - Ultimately, needs mitral valve fixed to keep her out of CHF.  - Continue Lasix 120 mg bid. BMET today.  2. CKD Stage IV:  Had R hydronephrosis in setting of hydronephrosis and required percutaneous drain in 9/18 (removed). Renal US 04/14/17 showed no further evidence of hydronephrosis. Creatinine is now near 4.         - I think she should initiate HD prior to cardiac surgery. She has followup with Dr. Hollie Salk and it appears that this is planned.  3. HTN: BP remains high.     - Continue hydralazine 100 mg three times a day  - Continue amlodipine 10 mg daily.  - Continue coreg 25 mg BID.  - Increase doxazosin to 8 mg daily.    4.  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  Confirmed on 4/19 TEE.  She needs mitral valve repair/replacement. - She has followup with Dr. Roxy Manns soon.    - She needs to have some teeth removed => she has had atypical chest pain but symptoms are stable.  I think that she is stable for dental procedure. 5. Atrial fibrillation: Paroxysmal.  She is in NSR by exam today.  She had a spontaneous RP hemorrhage in 9/18 but is now back on Eliquis.   - Continue Eliquis 2.5 mg bid (creatinine > 1.5, low weight).  - She is off amiodarone due to junctional bradycardia in the hospital.  6. CAD:  LHC in 1/19 showed severe ostial LCx disease and moderate LAD disease. She will need CABG with MVR. She has atypical chest pain  and has had a stable pattern for a while now.  - Continue ASA 81 and atorvastatin 40 mg daily.  - She is on Imdur 120 mg daily and Coreg 25 mg bid.   She will need dental procedure with Dr. Enrique Sack (clear from my standpoint to proceed) and followup with Dr. Hollie Salk to initiate HD.  She will then need followup with Dr. Roxy Manns to schedule CABG-MVR.  These appointments are set up. I will see her back in 2 months, hopefully post-surgery.   [name], MD 10/27/17

## 2017-10-28 ENCOUNTER — Other Ambulatory Visit (HOSPITAL_COMMUNITY): Payer: Self-pay | Admitting: Dentistry

## 2017-10-30 NOTE — Pre-Procedure Instructions (Signed)
Jocelyn Hill  10/30/2017      [facility] [place], [place] AT [place] [address] Phone: [phone] Fax: [phone]    Your procedure is scheduled on Thurs., Nov 05, 2017  Report to [facility] Admitting Entrance "A" at 5:30AM  Call this number if you have problems the morning of surgery:  [phone]   Remember:  Do not eat food or drink liquids after midnight.  Take these medicines the morning of surgery with A SIP OF WATER: AmLODipine (NORVASC), BuPROPion (WELLBUTRIN), BusPIRone (BUSPAR), Carvedilol (COREG), doxazosin (CARDURA), DULoxetine (CYMBALTA), HydrALAZINE (APRESOLINE), Isosorbide mononitrate (IMDUR), Levothyroxine (SYNTHROID, LEVOTHROID), and Pantoprazole (PROTONIX). If needed Acetaminophen (TYLENOL),   OxyCODONE (OXY IR/ROXICODONE), Ondansetron (ZOFRAN), and Albuterol Inhaler (Bring with you the day of surgery).  Follow your doctors instructions regarding your Aspirin and Eliquis.  If no instructions were given by your doctor, then you will need to call the prescribing office office to get instructions.    As of today, stop taking all Aspirins, Vitamins, Fish oils, and Herbal medications. Also stop all NSAIDS i.e. Advil, Ibuprofen, Motrin, Aleve, Anaprox, Naproxen, BC and Goody Powders. Including: Ferrous sulfate  How to Manage Your Diabetes Before and After Surgery  Why is it important to control my blood sugar before and after surgery? . Improving blood sugar levels before and after surgery helps healing and can limit problems. . A way of improving blood sugar control is eating a healthy diet by: o  Eating less sugar and carbohydrates o  Increasing activity/exercise o  Talking with your doctor about reaching your blood sugar goals . High blood sugars (greater than 180 mg/dL) can raise your risk of infections and slow your recovery, so you will need to  focus on controlling your diabetes during the weeks before surgery. . Make sure that the doctor who takes care of your diabetes knows about your planned surgery including the date and location.  How do I manage my blood sugar before surgery? . Check your blood sugar at least 4 times a day, starting 2 days before surgery, to make sure that the level is not too high or low. o Check your blood sugar the morning of your surgery when you wake up and every 2 hours until you get to the Short Stay unit. . If your blood sugar is less than 70 mg/dL, you will need to treat for low blood sugar: o Do not take insulin. o Treat a low blood sugar (less than 70 mg/dL) with  cup of clear juice (cranberry or apple), 4 glucose tablets, OR glucose gel. Recheck blood sugar in 15 minutes after treatment (to make sure it is greater than 70 mg/dL). If your blood sugar is not greater than 70 mg/dL on recheck, call [phone] o  for further instructions. . Report your blood sugar to the short stay nurse when you get to Short Stay.  . If you are admitted to the hospital after surgery: o Your blood sugar will be checked by the staff and you will probably be given insulin after surgery (instead of oral diabetes medicines) to make sure you have good blood sugar levels. o The goal for blood sugar control after surgery is 80-180 mg/dL.  . If your CBG is greater than 220 mg/dL, call us at [phone]   Do not wear jewelry, make-up or nail polish.  Do not wear lotions, powders,  perfumes, or deodorant.  Do not shave 48 hours prior to surgery.    Do not bring valuables to the hospital.  [facility] is not responsible for any belongings or valuables.  Contacts, dentures or bridgework may not be worn into surgery.  Leave your suitcase in the car.  After surgery it may be brought to your room.  For patients admitted to the hospital, discharge time will be determined by your treatment team.  Patients discharged the day of  surgery will not be allowed to drive home.   Special instructions:   [facility]- Preparing For Surgery  Before surgery, you can play an important role. Because skin is not sterile, your skin needs to be as free of germs as possible. You can reduce the number of germs on your skin by washing with CHG (chlorahexidine gluconate) Soap before surgery.  CHG is an antiseptic cleaner which kills germs and bonds with the skin to continue killing germs even after washing.  Please do not use if you have an allergy to CHG or antibacterial soaps. If your skin becomes reddened/irritated stop using the CHG.  Do not shave (including legs and underarms) for at least 48 hours prior to first CHG shower. It is OK to shave your face.  Please follow these instructions carefully.   1. Shower the NIGHT BEFORE SURGERY and the MORNING OF SURGERY with CHG.   2. If you chose to wash your hair, wash your hair first as usual with your normal shampoo.  3. After you shampoo, rinse your hair and body thoroughly to remove the shampoo.  4. Use CHG as you would any other liquid soap. You can apply CHG directly to the skin and wash gently with a scrungie or a clean washcloth.   5. Apply the CHG Soap to your body ONLY FROM THE NECK DOWN.  Do not use on open wounds or open sores. Avoid contact with your eyes, ears, mouth and genitals (private parts). Wash Face and genitals (private parts)  with your normal soap.  6. Wash thoroughly, paying special attention to the area where your surgery will be performed.  7. Thoroughly rinse your body with warm water from the neck down.  8. DO NOT shower/wash with your normal soap after using and rinsing off the CHG Soap.  9. Pat yourself dry with a CLEAN TOWEL.  10. Wear CLEAN PAJAMAS to bed the night before surgery, wear comfortable clothes the morning of surgery  11. Place CLEAN SHEETS on your bed the night of your first shower and DO NOT SLEEP WITH PETS.  Day of Surgery: Do not  apply any deodorants/lotions. Please wear clean clothes to the hospital/surgery center.    Please read over the following fact sheets that you were given. Pain Booklet, Coughing and Deep Breathing and Surgical Site Infection Prevention

## 2017-11-02 ENCOUNTER — Other Ambulatory Visit: Payer: Self-pay

## 2017-11-02 ENCOUNTER — Encounter (HOSPITAL_COMMUNITY): Payer: Self-pay

## 2017-11-02 ENCOUNTER — Encounter (HOSPITAL_COMMUNITY)
Admission: RE | Admit: 2017-11-02 | Discharge: 2017-11-02 | Disposition: A | Discharge status: Home / Self Care | Payer: Medicare Other | Source: Ambulatory Visit | Attending: Dentistry | Admitting: Dentistry

## 2017-11-02 ENCOUNTER — Ambulatory Visit: Payer: Self-pay | Admitting: Thoracic Surgery (Cardiothoracic Vascular Surgery)

## 2017-11-02 DIAGNOSIS — K029 Dental caries, unspecified: Secondary | ICD-10-CM | POA: Diagnosis present

## 2017-11-02 DIAGNOSIS — I130 Hypertensive heart and chronic kidney disease with heart failure and stage 1 through stage 4 chronic kidney disease, or unspecified chronic kidney disease: Secondary | ICD-10-CM | POA: Diagnosis not present

## 2017-11-02 DIAGNOSIS — I480 Paroxysmal atrial fibrillation: Secondary | ICD-10-CM | POA: Diagnosis not present

## 2017-11-02 DIAGNOSIS — I340 Nonrheumatic mitral (valve) insufficiency: Secondary | ICD-10-CM | POA: Diagnosis not present

## 2017-11-02 DIAGNOSIS — I2510 Atherosclerotic heart disease of native coronary artery without angina pectoris: Secondary | ICD-10-CM | POA: Diagnosis not present

## 2017-11-02 DIAGNOSIS — Z79899 Other long term (current) drug therapy: Secondary | ICD-10-CM | POA: Diagnosis not present

## 2017-11-02 DIAGNOSIS — E785 Hyperlipidemia, unspecified: Secondary | ICD-10-CM | POA: Diagnosis not present

## 2017-11-02 DIAGNOSIS — F419 Anxiety disorder, unspecified: Secondary | ICD-10-CM | POA: Diagnosis not present

## 2017-11-02 DIAGNOSIS — Z8673 Personal history of transient ischemic attack (TIA), and cerebral infarction without residual deficits: Secondary | ICD-10-CM | POA: Diagnosis not present

## 2017-11-02 DIAGNOSIS — Z87891 Personal history of nicotine dependence: Secondary | ICD-10-CM | POA: Diagnosis not present

## 2017-11-02 DIAGNOSIS — J9601 Acute respiratory failure with hypoxia: Secondary | ICD-10-CM | POA: Diagnosis not present

## 2017-11-02 DIAGNOSIS — Z886 Allergy status to analgesic agent status: Secondary | ICD-10-CM | POA: Diagnosis not present

## 2017-11-02 DIAGNOSIS — Z7901 Long term (current) use of anticoagulants: Secondary | ICD-10-CM | POA: Diagnosis not present

## 2017-11-02 DIAGNOSIS — E1122 Type 2 diabetes mellitus with diabetic chronic kidney disease: Secondary | ICD-10-CM | POA: Diagnosis not present

## 2017-11-02 DIAGNOSIS — K045 Chronic apical periodontitis: Secondary | ICD-10-CM | POA: Diagnosis not present

## 2017-11-02 DIAGNOSIS — E039 Hypothyroidism, unspecified: Secondary | ICD-10-CM | POA: Diagnosis not present

## 2017-11-02 DIAGNOSIS — Z951 Presence of aortocoronary bypass graft: Secondary | ICD-10-CM | POA: Diagnosis not present

## 2017-11-02 DIAGNOSIS — Z7982 Long term (current) use of aspirin: Secondary | ICD-10-CM | POA: Diagnosis not present

## 2017-11-02 DIAGNOSIS — F329 Major depressive disorder, single episode, unspecified: Secondary | ICD-10-CM | POA: Diagnosis not present

## 2017-11-02 DIAGNOSIS — I252 Old myocardial infarction: Secondary | ICD-10-CM | POA: Diagnosis not present

## 2017-11-02 DIAGNOSIS — I5032 Chronic diastolic (congestive) heart failure: Secondary | ICD-10-CM | POA: Diagnosis not present

## 2017-11-02 DIAGNOSIS — K219 Gastro-esophageal reflux disease without esophagitis: Secondary | ICD-10-CM | POA: Diagnosis not present

## 2017-11-02 DIAGNOSIS — N184 Chronic kidney disease, stage 4 (severe): Secondary | ICD-10-CM | POA: Diagnosis not present

## 2017-11-02 HISTORY — DX: Heart failure, unspecified: I50.9

## 2017-11-02 HISTORY — DX: Paroxysmal atrial fibrillation: I48.0

## 2017-11-02 HISTORY — DX: Acute myocardial infarction, unspecified: I21.9

## 2017-11-02 LAB — CBC
HCT: 36.5 % (ref 36.0–46.0)
Hemoglobin: 12.4 g/dL (ref 12.0–15.0)
MCH: 26.8 pg (ref 26.0–34.0)
MCHC: 34 g/dL (ref 30.0–36.0)
MCV: 79 fL (ref 78.0–100.0)
Platelets: 196 10*3/uL (ref 150–400)
RBC: 4.62 MIL/uL (ref 3.87–5.11)
RDW: 13.7 % (ref 11.5–15.5)
WBC: 7.8 10*3/uL (ref 4.0–10.5)

## 2017-11-02 LAB — GLUCOSE, CAPILLARY: Glucose-Capillary: 186 mg/dL — ABNORMAL HIGH (ref 65–99)

## 2017-11-02 NOTE — Progress Notes (Signed)
PCP - Maurine Simmering Cardiologist - [facility]  Chest x-ray - 10/19/17 EKG - 10/19/17 Stress Test - denies ECHO - 2019 Cardiac Cath2019 -   Fasting Blood Sugar - 120-140 Checks Blood Sugar __0___ times a day  Blood Thinner Instructions: last dose Eliquis 5/4 Aspirin Instructions: last dose 5/4  Anesthesia review: yes angela last note 4/22   Patient denies shortness of breath, fever, cough and chest pain at PAT appointment   Patient verbalized understanding of instructions that were given to them at the PAT appointment. Patient was also instructed that they will need to review over the PAT instructions again at home before surgery.

## 2017-11-03 ENCOUNTER — Encounter (HOSPITAL_COMMUNITY): Payer: Self-pay

## 2017-11-03 NOTE — Progress Notes (Addendum)
Anesthesia Chart Review:   Case:  [id number] Date/Time:  11/05/17 0715   Procedure:  MULTIPLE EXTRACTION WITH ALVEOLOPLASTY WITH GROSS DEBRIDEMENT OF REMAINING TEETH (N/A )   Anesthesia type:  General   Pre-op diagnosis:  mitral regurgitation and chronic periodontitis   Location:  [facility] OR ROOM 12 / [place] [facility]   Surgeon:  [name], DDS      DISCUSSION:  - Pt is a 67 year old female  - Dental procedure in preparation for CABG and MVR - Has cardiac clearance from Dr. Aundra Dubin 10/26/17 for dental procedure - Surgery was originally scheduled for end of April, but was postponed when pt arrived to pre-admission testing 10/19/17 c/o chest pain; pt sent to ED for eval but left AMA.   - Hx of advanced CKD (Cr 3.76 on 10/26/17).  Per Dr. Claris Gladden notes, it is likely pt will need to initiate dialysis prior to CABG/MVR.  - Hx PAF. Last dose eliquis 10/31/17  - I spoke with pt by telephone.  She reports no change in her usual CV symptoms.  She does request some medication prior to going back to OR so she won't be "aware" of heading to OR as she is nervous about the surgery.    VS: BP 123/69   Pulse (!) 57   Temp 36.4 C   Ht 5\' 8"  (1.727 m)   Wt 133 lb (60.3 kg)   SpO2 100%   BMI 20.22 kg/m   PROVIDERS: - PCP is [name], MD  - Cardiologist is [name], MD who cleared pt for surgery at last office visit 10/26/17 - Nephrologist is Madelon Lips, MD.  Per Dr. Claris Gladden notes, it is likely pt will need to initiate dialysis prior to CABG/MVR.    LABS: Labs reviewed: Acceptable for surgery.  - HbA1c was 6.2 on 09/17/17 (Care everywhere) - PT/INR will be obtained day of surgery.  - BMP 10/26/17 showed CR 3.76, BUN 31  (all labs ordered are listed, but only abnormal results are displayed)  Labs Reviewed  GLUCOSE, CAPILLARY - Abnormal; Notable for the following components:      Result Value   Glucose-Capillary 186 (*)    All other components within normal limits  CBC      IMAGES:  CXR 10/19/17: Moderate cardiomegaly.  No acute findings   EKG 10/19/17: NSR   CV:  TEE 10/08/17:  - Left ventricle: The cavity size was normal. Wall thickness was increased in a pattern of mild LVH. Systolic function was normal. The estimated ejection fraction was in the range of 55% to 60%. Wall motion was normal; there were no regional wall motion abnormalities. - Aortic valve: There was no stenosis. There was trivial   regurgitation. - Aorta: Grade 4 plaque in the descending thoracic aorta and arch. - Mitral valve: The mitral valve was thickened towards the leaflet tips, morphology suggests rheumatic heart disease. There was no significant stenosis, mean gradient 4 mmHg. There was severe mitral regurgitation with ERO 0.42 cm^2. There was systolic flow reversal in 1 out of 2 pulmonary veins interrogated. - Left atrium: The atrium was mildly to moderately dilated. No evidence of thrombus in the atrial cavity or appendage. - Right ventricle: The cavity size was normal. Systolic function was normal. - Right atrium: No evidence of thrombus in the atrial cavity or appendage. - Atrial septum: There was a small PFO by color doppler but bubble study was negative. - Tricuspid valve: Trivial TR, peak RV-RA gradient 37 mmHg. 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  Echo 09/04/17:  - Left ventricle: The cavity size was normal. Wall thickness was increased in a pattern of mild LVH. Systolic function was normal. The estimated ejection fraction was in the range of 55% to 60%. Wall motion was normal; there were no regional wall motion abnormalities. Doppler parameters are consistent with abnormal left ventricular relaxation (grade 1 diastolic dysfunction). Doppler parameters are consistent with high ventricular filling pressure. - Mitral valve: Calcified annulus. Mild thickening, consistent with rheumatic disease. There was moderate regurgitation. Valve area by pressure  half-time: 2.29 cm^2. - Left atrium: The atrium was severely dilated. - Pulmonary arteries: Systolic pressure was mildly increased. PA peak pressure: 37 mm Hg (S). - Pericardium, extracardiac: A trivial pericardial effusion was identified. - Impressions:  Normal LV systolic function; mild diastolic dysfunction; elevated LV filling pressure; mild LVH; mild rheumatic appearance of MV; moderate MR; severe LAE; mild TR; mildly elevated pulmonary pressure.  Cardiac cath 07/03/17:  1. Low filling pressure and preserved cardiac output.  2. 90% ostial LCx stenosis.  3. Napkin ring-like stenosis in the proximal LAD, at least 60% stenosis   Past Medical History:  Diagnosis Date  . Anxiety   . Arthritis   . Bronchitis   . CKD (chronic kidney disease) stage 4, GFR 15-29 ml/min (HCC) 03/16/2017  . Depression   . Diabetes mellitus without complication ([place])   . Diet-controlled diabetes mellitus ([place])   . GERD (gastroesophageal reflux disease)   . Heart failure ([place])   . Hyperlipidemia   . Hypertension   . Hypothyroidism   . Myocardial infarction ([place])    3x last one 2008  . PAF (paroxysmal atrial fibrillation) ([place])   . Stroke [facility])     Past Surgical History:  Procedure Laterality Date  . ABDOMINAL HYSTERECTOMY     PARTIALS  . CARDIAC CATHETERIZATION    . CESAREAN SECTION    . IR NEPHRO TUBE REMOV/FL  04/02/2017  . IR NEPHROSTOGRAM RIGHT THRU EXISTING ACCESS  04/02/2017  . IR NEPHROSTOMY PLACEMENT RIGHT  03/27/2017  . IR THORACENTESIS ASP PLEURAL SPACE W/IMG GUIDE  03/24/2017  . RIGHT/LEFT HEART CATH AND CORONARY ANGIOGRAPHY N/A 07/03/2017   Procedure: RIGHT/LEFT HEART CATH AND CORONARY ANGIOGRAPHY;  Surgeon: Larey Dresser, MD;  Location: [facility];  Service: Cardiovascular;  Laterality: N/A;  . TEE WITHOUT CARDIOVERSION N/A 10/08/2017   Procedure: TRANSESOPHAGEAL ECHOCARDIOGRAM (TEE);  Surgeon: Larey Dresser, MD;  Location: [facility] ENDOSCOPY;  Service: Cardiovascular;  Laterality:  N/A;  . TONSILLECTOMY    . TUBAL LIGATION      MEDICATIONS: . acetaminophen (TYLENOL) 325 MG tablet  . albuterol (PROVENTIL HFA;VENTOLIN HFA) 108 (90 Base) MCG/ACT inhaler  . amLODipine (NORVASC) 10 MG tablet  . apixaban (ELIQUIS) 2.5 MG TABS tablet  . aspirin EC 81 MG EC tablet  . atorvastatin (LIPITOR) 40 MG tablet  . b complex vitamins tablet  . buPROPion (WELLBUTRIN) 100 MG tablet  . busPIRone (BUSPAR) 15 MG tablet  . carvedilol (COREG) 25 MG tablet  . cholecalciferol (VITAMIN D) 1000 units tablet  . Darbepoetin Alfa (ARANESP) 40 MCG/0.4ML SOSY injection  . docusate sodium (COLACE) 100 MG capsule  . doxazosin (CARDURA) 8 MG tablet  . DULoxetine (CYMBALTA) 30 MG capsule  . ferrous sulfate 325 (65 FE) MG tablet  . furosemide (LASIX) 80 MG tablet  . gabapentin (NEURONTIN) 300 MG capsule  . hydrALAZINE (APRESOLINE) 100 MG tablet  . isosorbide mononitrate (IMDUR) 120 MG 24 hr  tablet  . levothyroxine (SYNTHROID, LEVOTHROID) 112 MCG tablet  . lidocaine (LIDODERM) 5 %  . magnesium gluconate (MAGONATE) 500 MG tablet  . montelukast (SINGULAIR) 10 MG tablet  . nitroGLYCERIN (NITROSTAT) 0.4 MG SL tablet  . Nutritional Supplements (FEEDING SUPPLEMENT, NEPRO CARB STEADY,) LIQD  . ondansetron (ZOFRAN) 4 MG tablet  . oxyCODONE (OXY IR/ROXICODONE) 5 MG immediate release tablet  . pantoprazole (PROTONIX) 40 MG tablet  . polyethylene glycol (MIRALAX / GLYCOLAX) packet  . potassium chloride 20 MEQ TBCR   No current facility-administered medications for this encounter.    - Last dose eliquis 10/31/17  If labs acceptable day of surgery, I anticipate pt can proceed with surgery as scheduled.   Willeen Cass, FNP-BC [facility]/Anesthesiology Phone: [phone] 11/04/2017 11:49 AM

## 2017-11-04 NOTE — Anesthesia Preprocedure Evaluation (Addendum)
Anesthesia Evaluation  Patient identified by MRN, date of birth, ID band Patient awake    Reviewed: Allergy & Precautions, NPO status , Patient's Chart, lab work & pertinent test results, reviewed documented beta blocker date and time   History of Anesthesia Complications Negative for: history of anesthetic complications  Airway Mallampati: II  TM Distance: >3 FB Neck ROM: Full    Dental  (+) Poor Dentition, Dental Advisory Given, Loose, Missing, Chipped   Pulmonary COPD, Current Smoker,    breath sounds clear to auscultation       Cardiovascular hypertension, Pt. on medications and Pt. on home beta blockers + angina (daily angina, resolves with NTG) with exertion + CAD (Cx, LAD disease)  + dysrhythmias Atrial Fibrillation + Valvular Problems/Murmurs MR  Rhythm:Regular Rate:Normal + Systolic murmurs 4/16 ECHO: EF 55-60%, severe MR, PFO   Neuro/Psych Anxiety Depression negative neurological ROS     GI/Hepatic Neg liver ROS, GERD  Medicated and Poorly Controlled,  Endo/Other  diabetes (off of meds now, )Hypothyroidism   Renal/GU Renal InsufficiencyRenal disease (creat 3.76, K+ 3.2, glu 167)     Musculoskeletal   Abdominal   Peds  Hematology  (+) Blood dyscrasia (eliquis, last dose Monday), ,   Anesthesia Other Findings   Reproductive/Obstetrics                            Anesthesia Physical Anesthesia Plan  ASA: IV  Anesthesia Plan: General   Post-op Pain Management:    Induction: Intravenous  PONV Risk Score and Plan: 3 and Ondansetron and Dexamethasone  Airway Management Planned: Nasal ETT  Additional Equipment: Arterial line  Intra-op Plan:   Post-operative Plan: Extubation in OR  Informed Consent: I have reviewed the patients History and Physical, chart, labs and discussed the procedure including the risks, benefits and alternatives for the proposed anesthesia with the  patient or authorized representative who has indicated his/her understanding and acceptance.   Dental advisory given  Plan Discussed with: CRNA and Surgeon  Anesthesia Plan Comments: (Plan routine monitors, A line, GETA)        Anesthesia Quick Evaluation

## 2017-11-05 ENCOUNTER — Ambulatory Visit (HOSPITAL_COMMUNITY): Payer: Medicare Other | Admitting: Emergency Medicine

## 2017-11-05 ENCOUNTER — Ambulatory Visit (HOSPITAL_COMMUNITY): Payer: Medicare Other | Admitting: Anesthesiology

## 2017-11-05 ENCOUNTER — Encounter (HOSPITAL_COMMUNITY)
Admission: RE | Disposition: A | Discharge status: Home / Self Care | Payer: Self-pay | Source: Ambulatory Visit | Attending: Dentistry

## 2017-11-05 ENCOUNTER — Ambulatory Visit (HOSPITAL_COMMUNITY)
Admission: RE | Admit: 2017-11-05 | Discharge: 2017-11-05 | Disposition: A | Discharge status: Home / Self Care | Payer: Medicare Other | Source: Ambulatory Visit | Attending: Dentistry | Admitting: Dentistry

## 2017-11-05 ENCOUNTER — Encounter (HOSPITAL_COMMUNITY): Payer: Self-pay | Admitting: *Deleted

## 2017-11-05 DIAGNOSIS — K219 Gastro-esophageal reflux disease without esophagitis: Secondary | ICD-10-CM | POA: Insufficient documentation

## 2017-11-05 DIAGNOSIS — E785 Hyperlipidemia, unspecified: Secondary | ICD-10-CM | POA: Insufficient documentation

## 2017-11-05 DIAGNOSIS — I2510 Atherosclerotic heart disease of native coronary artery without angina pectoris: Secondary | ICD-10-CM | POA: Insufficient documentation

## 2017-11-05 DIAGNOSIS — Z8673 Personal history of transient ischemic attack (TIA), and cerebral infarction without residual deficits: Secondary | ICD-10-CM | POA: Insufficient documentation

## 2017-11-05 DIAGNOSIS — Z79899 Other long term (current) drug therapy: Secondary | ICD-10-CM | POA: Insufficient documentation

## 2017-11-05 DIAGNOSIS — K029 Dental caries, unspecified: Secondary | ICD-10-CM | POA: Insufficient documentation

## 2017-11-05 DIAGNOSIS — K036 Deposits [accretions] on teeth: Secondary | ICD-10-CM

## 2017-11-05 DIAGNOSIS — E1122 Type 2 diabetes mellitus with diabetic chronic kidney disease: Secondary | ICD-10-CM | POA: Insufficient documentation

## 2017-11-05 DIAGNOSIS — I130 Hypertensive heart and chronic kidney disease with heart failure and stage 1 through stage 4 chronic kidney disease, or unspecified chronic kidney disease: Secondary | ICD-10-CM | POA: Insufficient documentation

## 2017-11-05 DIAGNOSIS — I5032 Chronic diastolic (congestive) heart failure: Secondary | ICD-10-CM | POA: Insufficient documentation

## 2017-11-05 DIAGNOSIS — K0889 Other specified disorders of teeth and supporting structures: Secondary | ICD-10-CM

## 2017-11-05 DIAGNOSIS — Z7982 Long term (current) use of aspirin: Secondary | ICD-10-CM | POA: Insufficient documentation

## 2017-11-05 DIAGNOSIS — I480 Paroxysmal atrial fibrillation: Secondary | ICD-10-CM | POA: Insufficient documentation

## 2017-11-05 DIAGNOSIS — K0530 Chronic periodontitis, unspecified: Secondary | ICD-10-CM

## 2017-11-05 DIAGNOSIS — Z01818 Encounter for other preprocedural examination: Secondary | ICD-10-CM

## 2017-11-05 DIAGNOSIS — K045 Chronic apical periodontitis: Secondary | ICD-10-CM | POA: Diagnosis present

## 2017-11-05 DIAGNOSIS — Z886 Allergy status to analgesic agent status: Secondary | ICD-10-CM | POA: Insufficient documentation

## 2017-11-05 DIAGNOSIS — Z87891 Personal history of nicotine dependence: Secondary | ICD-10-CM | POA: Insufficient documentation

## 2017-11-05 DIAGNOSIS — Z7901 Long term (current) use of anticoagulants: Secondary | ICD-10-CM | POA: Insufficient documentation

## 2017-11-05 DIAGNOSIS — F329 Major depressive disorder, single episode, unspecified: Secondary | ICD-10-CM | POA: Insufficient documentation

## 2017-11-05 DIAGNOSIS — Z951 Presence of aortocoronary bypass graft: Secondary | ICD-10-CM | POA: Insufficient documentation

## 2017-11-05 DIAGNOSIS — I214 Non-ST elevation (NSTEMI) myocardial infarction: Secondary | ICD-10-CM | POA: Diagnosis present

## 2017-11-05 DIAGNOSIS — E039 Hypothyroidism, unspecified: Secondary | ICD-10-CM | POA: Insufficient documentation

## 2017-11-05 DIAGNOSIS — J9601 Acute respiratory failure with hypoxia: Secondary | ICD-10-CM | POA: Insufficient documentation

## 2017-11-05 DIAGNOSIS — F419 Anxiety disorder, unspecified: Secondary | ICD-10-CM | POA: Insufficient documentation

## 2017-11-05 DIAGNOSIS — I252 Old myocardial infarction: Secondary | ICD-10-CM | POA: Insufficient documentation

## 2017-11-05 DIAGNOSIS — N184 Chronic kidney disease, stage 4 (severe): Secondary | ICD-10-CM | POA: Insufficient documentation

## 2017-11-05 DIAGNOSIS — K083 Retained dental root: Secondary | ICD-10-CM | POA: Diagnosis present

## 2017-11-05 DIAGNOSIS — I340 Nonrheumatic mitral (valve) insufficiency: Secondary | ICD-10-CM | POA: Insufficient documentation

## 2017-11-05 HISTORY — PX: MULTIPLE EXTRACTIONS WITH ALVEOLOPLASTY: SHX5342

## 2017-11-05 LAB — POCT I-STAT 4, (NA,K, GLUC, HGB,HCT)
Glucose, Bld: 167 mg/dL — ABNORMAL HIGH (ref 65–99)
HCT: 34 % — ABNORMAL LOW (ref 36.0–46.0)
Hemoglobin: 11.6 g/dL — ABNORMAL LOW (ref 12.0–15.0)
Potassium: 3.2 mmol/L — ABNORMAL LOW (ref 3.5–5.1)
Sodium: 143 mmol/L (ref 135–145)

## 2017-11-05 LAB — PROTIME-INR
INR: 1.05
Prothrombin Time: 13.6 seconds (ref 11.4–15.2)

## 2017-11-05 LAB — GLUCOSE, CAPILLARY: Glucose-Capillary: 141 mg/dL — ABNORMAL HIGH (ref 65–99)

## 2017-11-05 SURGERY — MULTIPLE EXTRACTION WITH ALVEOLOPLASTY
Anesthesia: General | Site: Mouth

## 2017-11-05 MED ORDER — OXYCODONE-ACETAMINOPHEN 5-325 MG PO TABS: 1.0000 | ORAL_TABLET | Freq: Four times a day (QID) | ORAL | Status: DC | PRN

## 2017-11-05 MED ORDER — SUCCINYLCHOLINE CHLORIDE 20 MG/ML IJ SOLN
INTRAMUSCULAR | Status: DC | PRN
  Administered 2017-11-05: 100 mg via INTRAVENOUS

## 2017-11-05 MED ORDER — LIDOCAINE 2% (20 MG/ML) 5 ML SYRINGE
INTRAMUSCULAR | Status: DC | PRN
  Administered 2017-11-05: 50 mg via INTRAVENOUS

## 2017-11-05 MED ORDER — FENTANYL CITRATE (PF) 100 MCG/2ML IJ SOLN
INTRAMUSCULAR | Status: AC
  Filled 2017-11-05: qty 2

## 2017-11-05 MED ORDER — PROPOFOL 10 MG/ML IV BOLUS
INTRAVENOUS | Status: AC
  Filled 2017-11-05: qty 40

## 2017-11-05 MED ORDER — CEFAZOLIN SODIUM-DEXTROSE 2-4 GM/100ML-% IV SOLN
2.0000 g | Freq: Once | INTRAVENOUS | Status: AC
  Administered 2017-11-05: 2 g via INTRAVENOUS
  Filled 2017-11-05: qty 100

## 2017-11-05 MED ORDER — LIDOCAINE-EPINEPHRINE 2 %-1:100000 IJ SOLN
INTRAMUSCULAR | Status: DC | PRN
  Administered 2017-11-05: 6.8 mL via INTRADERMAL

## 2017-11-05 MED ORDER — HEMOSTATIC AGENTS (NO CHARGE) OPTIME
TOPICAL | Status: DC | PRN
  Administered 2017-11-05: 1 via TOPICAL

## 2017-11-05 MED ORDER — LACTATED RINGERS IV SOLN: INTRAVENOUS | Status: DC

## 2017-11-05 MED ORDER — LIDOCAINE 2% (20 MG/ML) 5 ML SYRINGE
INTRAMUSCULAR | Status: AC
  Filled 2017-11-05: qty 5

## 2017-11-05 MED ORDER — BUPIVACAINE-EPINEPHRINE (PF) 0.5% -1:200000 IJ SOLN
INTRAMUSCULAR | Status: AC
  Filled 2017-11-05: qty 10.8

## 2017-11-05 MED ORDER — AMINOCAPROIC ACID SOLUTION 5% (50 MG/ML)
10.0000 mL | ORAL | Status: DC
  Administered 2017-11-05: 100 mL via ORAL
  Filled 2017-11-05 (×2): qty 100

## 2017-11-05 MED ORDER — PROMETHAZINE HCL 25 MG/ML IJ SOLN
6.2500 mg | INTRAMUSCULAR | Status: DC | PRN
Start: 2017-11-05 — End: 2017-11-05

## 2017-11-05 MED ORDER — MIDAZOLAM HCL 2 MG/2ML IJ SOLN
INTRAMUSCULAR | Status: AC
  Filled 2017-11-05: qty 2

## 2017-11-05 MED ORDER — PROPOFOL 10 MG/ML IV BOLUS
INTRAVENOUS | Status: DC | PRN
  Administered 2017-11-05: 100 mg via INTRAVENOUS

## 2017-11-05 MED ORDER — MEPERIDINE HCL 50 MG/ML IJ SOLN: 6.2500 mg | INTRAMUSCULAR | Status: DC | PRN

## 2017-11-05 MED ORDER — FENTANYL CITRATE (PF) 100 MCG/2ML IJ SOLN
25.0000 ug | INTRAMUSCULAR | Status: DC | PRN
Start: 2017-11-05 — End: 2017-11-05
  Administered 2017-11-05 (×2): 50 ug via INTRAVENOUS

## 2017-11-05 MED ORDER — GLYCOPYRROLATE 0.2 MG/ML IJ SOLN
INTRAMUSCULAR | Status: DC | PRN
  Administered 2017-11-05: 0.1 mg via INTRAVENOUS

## 2017-11-05 MED ORDER — OXYCODONE-ACETAMINOPHEN 5-325 MG PO TABS: ORAL_TABLET | ORAL | 0 refills | Status: DC

## 2017-11-05 MED ORDER — MIDAZOLAM HCL 2 MG/2ML IJ SOLN: 0.5000 mg | Freq: Once | INTRAMUSCULAR | Status: DC | PRN

## 2017-11-05 MED ORDER — ONDANSETRON HCL 4 MG/2ML IJ SOLN
INTRAMUSCULAR | Status: DC | PRN
  Administered 2017-11-05: 4 mg via INTRAVENOUS

## 2017-11-05 MED ORDER — FENTANYL CITRATE (PF) 250 MCG/5ML IJ SOLN
INTRAMUSCULAR | Status: AC
  Filled 2017-11-05: qty 5

## 2017-11-05 MED ORDER — BUPIVACAINE-EPINEPHRINE 0.5% -1:200000 IJ SOLN
INTRAMUSCULAR | Status: DC | PRN
  Administered 2017-11-05: 3.6 mL

## 2017-11-05 MED ORDER — 0.9 % SODIUM CHLORIDE (POUR BTL) OPTIME
TOPICAL | Status: DC | PRN
  Administered 2017-11-05: 1000 mL

## 2017-11-05 MED ORDER — MIDAZOLAM HCL 5 MG/5ML IJ SOLN
INTRAMUSCULAR | Status: DC | PRN
  Administered 2017-11-05 (×2): 1 mg via INTRAVENOUS

## 2017-11-05 MED ORDER — DEXTROSE 5 % IV SOLN
INTRAVENOUS | Status: DC | PRN
  Administered 2017-11-05: 25 ug/min via INTRAVENOUS

## 2017-11-05 MED ORDER — FENTANYL CITRATE (PF) 100 MCG/2ML IJ SOLN
INTRAMUSCULAR | Status: DC | PRN
  Administered 2017-11-05: 50 ug via INTRAVENOUS
  Administered 2017-11-05: 25 ug via INTRAVENOUS
  Administered 2017-11-05: 50 ug via INTRAVENOUS

## 2017-11-05 MED ORDER — LACTATED RINGERS IV SOLN
INTRAVENOUS | Status: DC | PRN
Start: 2017-11-05 — End: 2017-11-05
  Administered 2017-11-05: 07:00:00 via INTRAVENOUS

## 2017-11-05 MED ORDER — LIDOCAINE-EPINEPHRINE 2 %-1:100000 IJ SOLN
INTRAMUSCULAR | Status: AC
  Filled 2017-11-05: qty 3.4

## 2017-11-05 SURGICAL SUPPLY — 37 items
ALCOHOL 70% 16 OZ (MISCELLANEOUS) ×2 IMPLANT
ATTRACTOMAT 16X20 MAGNETIC DRP (DRAPES) ×2 IMPLANT
BANDAGE HEMOSTAT MRDH 4X4 STRL (MISCELLANEOUS) ×1 IMPLANT
BLADE SURG 15 STRL LF DISP TIS (BLADE) ×2 IMPLANT
BLADE SURG 15 STRL SS (BLADE) ×2
BNDG HEMOSTAT MRDH 4X4 STRL (MISCELLANEOUS) ×2
COVER SURGICAL LIGHT HANDLE (MISCELLANEOUS) ×2 IMPLANT
GAUZE PACKING FOLDED 2  STR (GAUZE/BANDAGES/DRESSINGS) ×1
GAUZE PACKING FOLDED 2 STR (GAUZE/BANDAGES/DRESSINGS) ×1 IMPLANT
GAUZE SPONGE 4X4 16PLY XRAY LF (GAUZE/BANDAGES/DRESSINGS) ×2 IMPLANT
GLOVE BIO SURGEON STRL SZ 6.5 (GLOVE) ×2 IMPLANT
GLOVE SURG ORTHO 8.0 STRL STRW (GLOVE) ×2 IMPLANT
GOWN STRL REUS W/ TWL LRG LVL3 (GOWN DISPOSABLE) ×1 IMPLANT
GOWN STRL REUS W/TWL 2XL LVL3 (GOWN DISPOSABLE) ×2 IMPLANT
GOWN STRL REUS W/TWL LRG LVL3 (GOWN DISPOSABLE) ×1
HEMOSTAT SURGICEL 2X14 (HEMOSTASIS) IMPLANT
KIT BASIN OR (CUSTOM PROCEDURE TRAY) ×2 IMPLANT
KIT TURNOVER KIT B (KITS) ×2 IMPLANT
MANIFOLD NEPTUNE WASTE (CANNULA) ×2 IMPLANT
NEEDLE BLUNT 16X1.5 OR ONLY (NEEDLE) ×2 IMPLANT
NEEDLE DENTAL 27 LONG (NEEDLE) ×4 IMPLANT
NS IRRIG 1000ML POUR BTL (IV SOLUTION) ×2 IMPLANT
PACK EENT II TURBAN DRAPE (CUSTOM PROCEDURE TRAY) ×2 IMPLANT
PAD ARMBOARD 7.5X6 YLW CONV (MISCELLANEOUS) ×2 IMPLANT
SPONGE SURGIFOAM ABS GEL 100 (HEMOSTASIS) IMPLANT
SPONGE SURGIFOAM ABS GEL 12-7 (HEMOSTASIS) IMPLANT
SPONGE SURGIFOAM ABS GEL SZ50 (HEMOSTASIS) IMPLANT
SUCTION FRAZIER HANDLE 10FR (MISCELLANEOUS) ×1
SUCTION TUBE FRAZIER 10FR DISP (MISCELLANEOUS) ×1 IMPLANT
SUT CHROMIC 3 0 PS 2 (SUTURE) ×6 IMPLANT
SUT CHROMIC 4 0 P 3 18 (SUTURE) IMPLANT
SYR 50ML SLIP (SYRINGE) ×2 IMPLANT
TOWEL NATURAL 10PK STERILE (DISPOSABLE) ×2 IMPLANT
TUBE CONNECTING 12X1/4 (SUCTIONS) ×2 IMPLANT
WATER STERILE IRR 1000ML POUR (IV SOLUTION) ×2 IMPLANT
WATER TABLETS ICX (MISCELLANEOUS) ×2 IMPLANT
YANKAUER SUCT BULB TIP NO VENT (SUCTIONS) ×2 IMPLANT

## 2017-11-05 NOTE — Discharge Instructions (Addendum)

## 2017-11-05 NOTE — Anesthesia Postprocedure Evaluation (Signed)
Anesthesia Post Note  Patient: Jocelyn Hill  Procedure(s) Performed: Extraction of tooth #'s 4,5,7,8,9,10,12,14 and 29 with alveoloplasty and gross debridement of remaining teeth (N/A Mouth)     Patient location during evaluation: PACU Anesthesia Type: General Level of consciousness: awake and alert, oriented and patient cooperative Pain management: pain level controlled Vital Signs Assessment: post-procedure vital signs reviewed and stable Respiratory status: spontaneous breathing, nonlabored ventilation and respiratory function stable Cardiovascular status: blood pressure returned to baseline and stable Postop Assessment: no apparent nausea or vomiting Anesthetic complications: no    Last Vitals:  Vitals:   11/05/17 1102 11/05/17 1329  BP: (!) 128/59   Pulse:    Resp: 18   Temp: 36.4 C 37 C  SpO2: 100%     Last Pain:  Vitals:   11/05/17 1329  TempSrc: Oral  PainSc:                  Eduard Penkala,E. Jeyden Coffelt

## 2017-11-05 NOTE — Transfer of Care (Signed)
Immediate Anesthesia Transfer of Care Note  Patient: Jocelyn Hill  Procedure(s) Performed: Extraction of tooth #'s 4,5,7,8,9,10,12,14 and 29 with alveoloplasty and gross debridement of remaining teeth (N/A Mouth)  Patient Location: PACU  Anesthesia Type:General  Level of Consciousness: awake  Airway & Oxygen Therapy: Patient Spontanous Breathing and Patient connected to nasal cannula oxygen  Post-op Assessment: Report given to RN and Post -op Vital signs reviewed and stable  Post vital signs: Reviewed and stable  Last Vitals:  Vitals Value Taken Time  BP 141/84 11/05/2017  9:32 AM  Temp    Pulse 80 11/05/2017  9:33 AM  Resp 15 11/05/2017  9:33 AM  SpO2 100 % 11/05/2017  9:33 AM  Vitals shown include unvalidated device data.  Last Pain:  Vitals:   11/05/17 0613  TempSrc:   PainSc: 7       Patients Stated Pain Goal: 3 (18/28/83 3744)  Complications: No apparent anesthesia complications

## 2017-11-05 NOTE — Progress Notes (Signed)
PRE-OPERATIVE NOTE:  11/05/2017 Jocelyn Hill [id number]  VITALS: BP (!) 148/86   Pulse 60   Temp 97.9 F (36.6 C) (Oral)   Resp 20   SpO2 100%   Lab Results  Component Value Date   WBC 7.8 11/02/2017   HGB 11.6 (L) 11/05/2017   HCT 34.0 (L) 11/05/2017   MCV 79.0 11/02/2017   PLT 196 11/02/2017   BMET    Component Value Date/Time   NA 143 11/05/2017 0622   K 3.2 (L) 11/05/2017 0622   CL 105 10/26/2017 1459   CO2 26 10/26/2017 1459   GLUCOSE 167 (H) 11/05/2017 0622   BUN 31 (H) 10/26/2017 1459   CREATININE 3.76 (H) 10/26/2017 1459   CALCIUM 9.6 10/26/2017 1459   CALCIUM 9.7 04/21/2017 0231   GFRNONAA 12 (L) 10/26/2017 1459   GFRAA 13 (L) 10/26/2017 1459    Lab Results  Component Value Date   INR 1.05 11/05/2017   INR 0.90 07/03/2017   INR 1.46 04/12/2017   No results found for: PTT   Jocelyn Hill presents for multiple extractions with alveoloplasty and gross debridement remaining dentition in the operating room with general anesthesia.  Patient is aware that additional teeth will be evaluated for extraction at this time after more thorough examination in the operating room.  Patient expresses understanding with this plan of care.   SUBJECTIVE: The patient denies any acute medical or dental changes but is complaining of head, back, legs, feet, and chest discomfort.  Dr. Verdie Drown, anesthesiology, is currently evaluating patient for this discomfort.    EXAM: No sign of acute dental changes.  ASSESSMENT: Patient is affected by retained root segments, dental caries, chronic apical periodontitis, chronic periodontitis, accretions, and tooth mobility.  PLAN: Patient agrees to proceed with treatment as planned in the operating room as previously discussed and accepts the risks, benefits, and complications of the proposed treatment. Patient is aware of the risk for bleeding, bruising, swelling, infection, pain, nerve damage, soft tissue damage, damage to adjacent  teeth, sinus involvement, root tip fracture, mandible fracture, and the risks of complications associated with the anesthesia. Patient also is aware of the potential for other complications up to and including death due to her overall cardiovascular and respiratory compromise.     [name], DDS

## 2017-11-05 NOTE — Progress Notes (Signed)
Pt. Stating she hurts all over, head,back,legs,feet and chest.States she has chest pressure 4 on 0-10 scale. Pt./daughter state Dr. Aundra Dubin said she will have this until her heart surgery. Dr. Linna Caprice notified, stated to get ekg. Dr. Linna Caprice stated he will come see pt.

## 2017-11-05 NOTE — H&P (Signed)
11/05/2017  Patient:            Jocelyn Hill Date of Birth:  1951/04/27 MRN:                [id number]   BP (!) 148/86   Pulse 60   Temp 97.9 F (36.6 C) (Oral)   Resp 20   SpO2 100%    Jocelyn Hill is a 67 year old female that presents for extraction of indicated teeth with alveoloplasty and gross debridement remaining dentition in the operating room with general anesthesia.  The patient denies any acute medical or dental changes.  The patient agreed to proceed with treatment as planned.  Patient understands of multiple teeth will be extracted with evaluation of additional extractions to be performed as needed after more thorough examination in the operating room.  Please see note from Dr. Marigene Ehlers dated 10/26/2017 to act as H&P for dental operating room procedure.  [name], DDS  Larey Dresser, MD  Physician  Heart Failure  Progress Notes    Signed  Date of Service:  10/26/2017 11:59 PM          Signed            Show:Clear all [x] Manual[x] Template[x] Copied  Added by: [x] Larey Dresser, MD   [] Hover for details     Advanced Heart Failure Clinic Note   PCP: Dr. Dorita Fray ([place]) HF Cardiology: Dr. Aundra Dubin  HPI: Jocelyn Hill a 67 y.o.femalewith h/o mitral regurgitation, chronic diastolic CHF, CAD s/p MI, long standing HTN, DM2, CKD IV-V, h/o Stroke. Anxiety, depression and GERD. Patient reported that she has had 2 MIs treated at [facility] in [place] several years ago.   Presented to [facility] on 03/12/2017 with acute hypoxic respiratory failure. ECHO showed normal LV function with severe MR. Diuresed with IV lasix over 20 pounds. She was being considered for MVR by Dr Roxy Manns but due to multiple complications she was not a candidate. Hospital course was complicated by acute respiratory failure, acute renalfailure, atrial fibrillation, retroperitoneal bleed, r hydronephrosis (perc tube) and E coli bacteremia. She was started  on anticoagulation for atrial fibrillation but this was stopped with RP hematoma.  She converted to NSR on amiodarone but developed junctional rhythm so amiodarone was stopped. Creatinine peaked at 5.  She did not require dialysis and gradually renal function improved. Discharged to SNF without diuretics. Discharge weight was 124 pounds.   Admitted again 04/12/17-04/23/17 with respiratory failure from [facility]. She had gained 16 pounds at SNF as she was not on diuretic. CXR with bilateral infiltrates. Placed on vanc and zosyn. Pertinent admission labs included BNP >3000, creatinine 3.75, K 4.1, WBC 16.1, procalcitonin 2.06, and troponin 0.04. Requiring 100% high flow oxygen. Blood CX - NGTD. Diuresing with high dose IV lasix.  Nephrology consulted, renal US with no evidence of hydronephrosis (pt had percutaneous nephrostomy drain the month prior due to this). Also placed on NTG drip for HTN and CP. She was seen by Dr. Roxy Manns again and felt still to not be a MVR candidate. Discharged on Lasix 160 mg BID. Discharge weight was 114 pounds.   TTE 03/12/17 LVEF 60-65%, Grade 2 DD, Severe MR, Severe LAE, Mild RAE, Mild/Mod TR, PA peak pressure 60 mm hg, small/mod pericardial effusion.  TEE 03/17/17 LVEF 55-60%, Trivial TR, Severe MR, mild LAE, mild mod TR. No evidence of vegetation.  LHC/RHC in 1/19 showed 90% ostial LCx, 60% mid LAD.   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   She returns today for followup of CHF and CAD.  She is awaiting MVR/CABG.  She still needs teeth removed.  She is nearing HD and probably should start HD prior to surgery. She follows with Dr. Hollie Salk and will see her soon.  She remains short of breath walking short distances, very short of breath with stairs. She has 4 pillow orthopnea.  She has chest pain on most days, but it is not exertional (occurs randomly).  Symptoms have been  stable.   Labs (10/18): K 3.8, creatinine 2.88 Labs (12/18): LDL 64, HDL 66 Labs (3/19): hgb 11.4 Labs (4/19): K 3.9, creatinine 3.97  Review of Systems: All systems reviewed and negative except as per HPI.   PMH: 1. Chronic diastolic CHF:  - TTE 7/42/59 LVEF 60-65%, Grade 2 DD, Severe MR, Severe LAE, Mild RAE, Mild/Mod TR, PA peak pressure 60 mm hg, small/mod pericardial effusion. - RHC (1/19): mean RA 2, PA 37/12 mean 26, mean PCWP 13, CI 2.98 2. CKD: stage IV.  3. Anxiety/panic attacks 4. DM 5. HTN 6. CAD: Patient reports prior MI treated at [facility].  No records available - LHC in 1/19 showed 90% ostial LCx, 60% mid LAD.  7. Retroperitoneal hematoma: With anticoagulation in 9/18.  8. Hydronephrosis due to RP hematoma: Resolved.  9. GERD 10. Hyperlipidemia 11. H/o CVA 12. 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  - TEE 03/17/17 LVEF 55-60%, Trivial TR, Severe MR, mild LAE, mild mod TR. No evidence of vegetation. 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  13. Atrial fibrillation: Paroxysmal.  Noted in 9/18, not anticoagulated currently due to RP hematoma.  - Junctional rhythm on amiodarone, amiodarone stopped.         Current Outpatient Medications  Medication Sig Dispense Refill  . acetaminophen (TYLENOL) 325 MG tablet Take 2 tablets (650 mg total) by mouth every 6 (six) hours as needed for mild pain, fever or headache. 30 tablet 0  . albuterol (PROVENTIL HFA;VENTOLIN HFA) 108 (90 Base) MCG/ACT inhaler Inhale 2 puffs into the lungs every 6 (six) hours as needed for wheezing or shortness of breath.    Marland Kitchen amLODipine (NORVASC) 10 MG tablet Take 1 tablet (10 mg total) by mouth daily. 30 tablet 0  . apixaban (ELIQUIS) 2.5 MG TABS tablet Take 1 tablet (2.5 mg total) by mouth 2 (two) times daily. 60 tablet 11  . aspirin EC  81 MG EC tablet Take 1 tablet (81 mg total) by mouth daily. 30 tablet 0  . atorvastatin (LIPITOR) 40 MG tablet Take 1 tablet (40 mg total) by mouth daily. 30 tablet 0  . b complex vitamins tablet Take 1 tablet by mouth daily.    Marland Kitchen buPROPion (WELLBUTRIN) 100 MG tablet Take 100 mg by mouth 3 (three) times daily.    . busPIRone (BUSPAR) 15 MG tablet Take 1 tablet (15 mg total) by mouth 2 (two) times daily. 60 tablet 0  . carvedilol (COREG) 25 MG tablet Take 25 mg by mouth 2 (two) times daily with a meal.    . cholecalciferol (VITAMIN D) 1000 units tablet Take 1,000 Units by mouth daily.    . Darbepoetin Alfa (ARANESP) 40 MCG/0.4ML SOSY injection Inject 0.4 mLs (40 mcg total) into the skin every Tuesday  at 6 PM. 8.4 mL 0  . docusate sodium (COLACE) 100 MG capsule Take 1 capsule (100 mg total) by mouth 2 (two) times daily. (Patient taking differently: Take 100 mg by mouth daily. ) 10 capsule 0  . doxazosin (CARDURA) 8 MG tablet Take 1 tablet (8 mg total) by mouth daily. 30 tablet 6  . DULoxetine (CYMBALTA) 30 MG capsule Take 1 capsule (30 mg total) by mouth daily. 30 capsule 0  . ferrous sulfate 325 (65 FE) MG tablet Take 1 tablet (325 mg total) by mouth 2 (two) times daily with a meal. 60 tablet 0  . furosemide (LASIX) 80 MG tablet Take 1.5 tablets (120 mg total) by mouth 2 (two) times daily. 90 tablet 6  . gabapentin (NEURONTIN) 300 MG capsule Take 300 mg by mouth at bedtime.  11  . hydrALAZINE (APRESOLINE) 100 MG tablet Take 1 tablet (100 mg total) by mouth every 8 (eight) hours. 90 tablet 0  . isosorbide mononitrate (IMDUR) 120 MG 24 hr tablet Take 1 tablet (120 mg total) by mouth daily. 30 tablet 0  . levothyroxine (SYNTHROID, LEVOTHROID) 112 MCG tablet Take 1 tablet (112 mcg total) by mouth daily before breakfast. 30 tablet 0  . lidocaine (LIDODERM) 5 % Place 1 patch onto the skin daily. Remove & Discard patch within 12 hours or as directed by MD 30 patch 0  . magnesium gluconate  (MAGONATE) 500 MG tablet Take 0.5 tablets (250 mg total) by mouth daily. 30 tablet 0  . montelukast (SINGULAIR) 10 MG tablet Take 1 tablet (10 mg total) by mouth daily. (Patient taking differently: Take 10 mg by mouth at bedtime. ) 30 tablet 0  . nitroGLYCERIN (NITROSTAT) 0.4 MG SL tablet Place 1 tablet (0.4 mg total) under the tongue every 5 (five) minutes as needed for chest pain. 30 tablet 0  . Nutritional Supplements (FEEDING SUPPLEMENT, NEPRO CARB STEADY,) LIQD Take 237 mLs by mouth 2 (two) times daily between meals. 237 mL 0  . ondansetron (ZOFRAN) 4 MG tablet Take 1 tablet (4 mg total) by mouth every 8 (eight) hours as needed for nausea or vomiting. 30 tablet 0  . oxyCODONE (OXY IR/ROXICODONE) 5 MG immediate release tablet Take 1 tablet (5 mg total) by mouth every 8 (eight) hours as needed (pain). 6 tablet 0  . pantoprazole (PROTONIX) 40 MG tablet Take 1 tablet (40 mg total) by mouth 2 (two) times daily before a meal. 60 tablet 0  . polyethylene glycol (MIRALAX / GLYCOLAX) packet Take 17 g by mouth daily as needed (constipation).    . potassium chloride 20 MEQ TBCR Take 20 mEq by mouth daily. 90 tablet 3   No current facility-administered medications for this encounter.         Allergies  Allergen Reactions  . Ace Inhibitors Anaphylaxis and Swelling  . Motrin Ib [Ibuprofen] Anaphylaxis      Social History        Socioeconomic History  . Marital status: Divorced    Spouse name: Not on file  . Number of children: Not on file  . Years of education: Not on file  . Highest education level: Not on file  Occupational History  . Not on file  Social Needs  . Financial resource strain: Not on file  . Food insecurity:    Worry: Not on file    Inability: Not on file  . Transportation needs:    Medical: Not on file    Non-medical: Not on file  Tobacco  Use  . Smoking status: Former Smoker    Types: Cigarettes  . Smokeless tobacco: Never Used  Substance and  Sexual Activity  . Alcohol use: No  . Drug use: No  . Sexual activity: Never  Lifestyle  . Physical activity:    Days per week: Not on file    Minutes per session: Not on file  . Stress: Not on file  Relationships  . Social connections:    Talks on phone: Not on file    Gets together: Not on file    Attends religious service: Not on file    Active member of club or organization: Not on file    Attends meetings of clubs or organizations: Not on file    Relationship status: Not on file  . Intimate partner violence:    Fear of current or ex partner: Not on file    Emotionally abused: Not on file    Physically abused: Not on file    Forced sexual activity: Not on file  Other Topics Concern  . Not on file  Social History Narrative  . Not on file           Family History  Problem Relation Age of Onset  . Hypertension Mother        Vitals:   10/26/17 1424  BP: (!) 141/83  Pulse: (!) 58  SpO2: 100%  Weight: 136 lb 8 oz (61.9 kg)     PHYSICAL EXAM: General: NAD Neck: No JVD, no thyromegaly or thyroid nodule.  Lungs: Clear to auscultation bilaterally with normal respiratory effort. CV: Nondisplaced PMI.  Heart regular S1/S2, no S3/S4, 2/6 HSM apex.  1+ ankle edema.  No carotid bruit.  Normal pedal pulses.  Abdomen: Soft, nontender, no hepatosplenomegaly, no distention.  Skin: Intact without lesions or rashes.  Neurologic: Alert and oriented x 3.  Psych: Normal affect. Extremities: No clubbing or cyanosis.  HEENT: Normal.   ASSESSMENT & PLAN: 1. Chronic diastolic CHF: ECHO 07/3534 EF 60-65% with severe MR. On exam today, she does not appear volume overloaded despite NYHA class IIIb symptoms (stable and likely due to severe MR). She has been taking Lasix 120 mg bid. - Ultimately, needs mitral valve fixed to keep her out of CHF.  -Continue Lasix 120 mg bid. BMET today.  2. CKD Stage IV: Had R hydronephrosis in setting of  hydronephrosis and required percutaneous drain in 9/18 (removed). Renal US 04/14/17 showed no further evidence of hydronephrosis. Creatinine is now near 4.    - I think she should initiate HD prior to cardiac surgery. She has followup with Dr. Hollie Salk and it appears that this is planned.  3. HTN:BP remains high.   - Continue hydralazine 100 mg three times a day  - Continue amlodipine 10 mg daily.  - Continue coreg 25 mg BID.  - Increase doxazosin to 8 mg daily.   4.  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 Confirmed on 4/19 TEE. She needs mitral valve repair/replacement. - She has followup with Dr. Roxy Manns soon.   - She needs to have some teeth removed => she has had atypical chest pain but symptoms are stable.  I think that she is stable for dental procedure. 5. Atrial fibrillation: Paroxysmal.  She is in NSR by exam today.  She had a spontaneous RP hemorrhage in 9/18 but is now back on Eliquis.   - Continue Eliquis 2.5 mg bid (creatinine > 1.5, low weight).  - She is off amiodarone due to  junctional bradycardia in the hospital.  6. CAD:  LHC in 1/19 showed severe ostial LCx disease and moderate LAD disease. She will need CABG with MVR. She has atypical chest pain and has had a stable pattern for a while now.  - Continue ASA 81 and atorvastatin 40 mg daily.  - She is on Imdur 120 mg daily and Coreg 25 mg bid.   She will need dental procedure with Dr. Enrique Sack (clear from my standpoint to proceed) and followup with Dr. Hollie Salk to initiate HD.  She will then need followup with Dr. Roxy Manns to schedule CABG-MVR.  These appointments are set up. I will see her back in 2 months, hopefully post-surgery.   [name], MD 10/27/17          Electronically signed by Larey Dresser, MD at 10/27/2017 12:50 AM

## 2017-11-05 NOTE — Anesthesia Procedure Notes (Signed)
Procedure Name: Intubation Date/Time: 11/05/2017 7:47 AM Performed by: Lieutenant Diego, CRNA Pre-anesthesia Checklist: Patient identified, Emergency Drugs available, Suction available and Patient being monitored Patient Re-evaluated:Patient Re-evaluated prior to induction Oxygen Delivery Method: Circle system utilized Preoxygenation: Pre-oxygenation with 100% oxygen Induction Type: IV induction Ventilation: Mask ventilation without difficulty Laryngoscope Size: Miller and 3 Grade View: Grade I Tube type: Oral Tube size: 7.0 mm Number of attempts: 1 Airway Equipment and Method: Stylet Placement Confirmation: ETT inserted through vocal cords under direct vision,  positive ETCO2 and breath sounds checked- equal and bilateral Secured at: 22 cm Tube secured with: Tape Dental Injury: Teeth and Oropharynx as per pre-operative assessment

## 2017-11-05 NOTE — Op Note (Signed)
OPERATIVE REPORT  Patient:            Jocelyn Hill Date of Birth:  17-Jun-1951 MRN:                [id number]   DATE OF PROCEDURE:  11/05/2017  PREOPERATIVE DIAGNOSES: 1.  Severe mitral regurgitation 2.  Pre-heart valve surgery dental protocol 3.  Chronic apical periodontitis 4.  Retained root segments 5.  Dental caries 6.  Chronic periodontitis 7.  Accretions  POSTOPERATIVE DIAGNOSES: 1.  Severe mitral regurgitation 2.  Pre-heart valve surgery dental protocol 3.  Chronic apical periodontitis 4.  Retained root segments 5.  Dental caries 6.  Chronic periodontitis 7.  Accretions  OPERATIONS: 1. Multiple extraction of tooth numbers 4, 5, 7, 8, 9, 10, 12, 14, and 29 2.  3 quadrants of alveoloplasty 3. Gross debridement of remaining dentition   SURGEON: [name], DDS  ASSISTANT: Molli Posey (dental assistant)  ANESTHESIA: General anesthesia via oral endotracheal tube.  MEDICATIONS: 1. Ancef 2 g IV prior to invasive dental procedures. 2. Local anesthesia with a total utilization of 3 carpules each containing 34 mg of lidocaine with 0.017 mg of epinephrine as well as 1 carpules each containing 9 mg of bupivacaine with 0.009 mg of epinephrine.  SPECIMENS: There are 9 teeth that were discarded.  DRAINS: None  CULTURES: None  COMPLICATIONS: None  ESTIMATED BLOOD LOSS: 100 mLs.  INTRAVENOUS FLUIDS: 700 mLs of Lactated ringers solution.  INDICATIONS: The patient was previously diagnosed with severe mitral regurgitation.  A medically necessary dental consultation was then requested to evaluate poor dentition.  The patient was examined and treatment planned for multiple extractions with alveoloplasty and gross debridement of remaining dentition in the operating room with general anesthesia.  This treatment plan was formulated to decrease the risks and complications associated with dental infection from affecting the patient's systemic health and the  anticipated heart valve surgery.  OPERATIVE FINDINGS: Patient was examined operating room number 12.  The teeth were identified for extraction.  Tooth numbers 4, 5, 7, 8, 9, 10, 12, 14, and 29 were identified for extraction.  Tooth numbers 5 and 29 were added to the treatment plan for extraction due to tooth mobility and dental caries.  The patient, therefore, was noted be affected by chronic periodontitis, tooth mobility, chronic apical periodontitis, dental caries, and the presence of multiple retained root segments.   DESCRIPTION OF PROCEDURE: Patient was brought to the main operating room number 12. Patient was then placed in the supine position on the operating table. General anesthesia was then induced per the anesthesia team. The patient was then prepped and draped in the usual manner for dental medicine procedure. A timeout was performed. The patient was identified and procedures were verified. A throat pack was placed at this time. The oral cavity was then thoroughly examined with the findings noted above. The patient was then ready for dental medicine procedure as follows:  Local anesthesia was then administered sequentially with a total utilization of 3 carpules each containing 34 mg of lidocaine with 0.017 mg of epinephrine as well as 1 carpule  each containing 9 mg bupivacaine with 0.009 mg of epinephrine.  The Maxillary left and right quadrants first approached. Anesthesia was then delivered utilizing infiltration with lidocaine with epinephrine. A #15 blade incision was then made from the mesial of #3 and extended to the distal of #6.  A second 15 blade incision was then made from the mesial of #11 and  extended to the mesial of #6.  An additional 15 blade incision was made from the maxillary left tuberosity and extended to the distal of #11.  Surgical flaps was then carefully reflected.  Tooth numbers 4, 5, 7 through 10, 12, and 14 were then subluxated with a series of straight elevators.   Appropriate amounts of buccal and interseptal bone was then removed around tooth numbers 4, 5, 7, 8, 9, 10, 12, and 14 with a surgical handpiece and bur copious amounts sterile water.  The teeth were then again subluxated with a series of straight elevators.  Tooth numbers 4, 5, 7 - 10, 12, 14 were then removed with a 150 forceps without complications.  Alveoloplasty was then performed utilizing a ronguers and bone file to help achieve primary closure.  The surgical site was then irrigated with copious amounts of sterile saline. The tissues were approximated and trimmed appropriately.  A piece of MRDH was then placed in each extraction socket and surgical site appropriately.  The maxillary right surgical site was then closed from the mesial #3 and extended to the distal #6 utilizing 3-0 chromic gut suture in a continuous interrupted suture technique x1.  The maxillary anterior area was then approached and closed from the mesial of #6 and extended to the mesial of #11 with 3-0 chromic gut material in a continuous interrupted suture technique x1.  The maxillary left surgical site was then closed from the maxillary left tuberosity and extended the distal #11 utilizing 3-0 chromic gut suture in a continuous interrupted suture technique x1.  2 individual interrupted sutures were then placed to further close the surgical site as needed.    At this point time, the mandibular right quadrant was approached. The patient was given an inferior alveolar nerve blocks and long buccal nerve blocks utilizing the bupivacaine with epinephrine. Further infiltration was then achieved utilizing the lidocaine with epinephrine. A 15 blade incision was then made from the distal of number 31 extended to the distal of #28.  A surgical flap was then carefully reflected. Tooth number 29 was then subluxated with a series straight elevators.  Tooth #29 was then removed with a 151 forceps without complications.  Alveoloplasty was then performed  utilizing a ronguers and bone file to help achieve primary closure.  The surgical site was irrigated with copious amounts sterile saline.  The tissues were approximated and trimmed appropriately.  A piece of M RDH was placed the extraction socket.  The surgical site was then closed from the distal of #31 extended to the distal of #28 utilizing 3-0 chromic gut suture in a continuous interrupted suture technique x1.    At this point time the remaining dentition was approached.  A sonic scaler was used to remove accretions.  A series of hand curettes were then used to further remove accretions.  The Sonic scaler was then again used to further refine the removal of Accretions.  This completed the gross debridement procedure.  At this point time, the entire mouth was irrigated with copious amounts of sterile saline. The patient was examined for complications, seeing none, the dental medicine procedure was deemed to be complete. The throat pack was removed at this time. An oral airway was then placed at the request of the anesthesia team. A series of 4 x 4 gauze moistened with Amicar 5% rinse were placed in the mouth to aid hemostasis. The patient was then handed over to the anesthesia team for final disposition. After an appropriate amount of  time, the patient was extubated and taken to the postanesthsia care unit in good condition. All counts were correct for the dental medicine procedure.  The patient is to continue the Amicar rinses postoperatively.  Patient is to rinse with 10 mL's every hour for the next 10 hours in a swish and spit manner.  Patient is to restart her Eliquis therapy tomorrow evening.  Patient is to use the Percocet 5/325 as needed for postoperative pain.   [name], DDS.

## 2017-11-05 NOTE — Anesthesia Procedure Notes (Signed)
Arterial Line Insertion Start/End5/02/2018 7:15 AM, 11/05/2017 7:20 AM Performed by: Lieutenant Diego, CRNA, CRNA  Patient location: Pre-op. Preanesthetic checklist: patient identified, IV checked, site marked, risks and benefits discussed, surgical consent, monitors and equipment checked, pre-op evaluation, timeout performed and anesthesia consent Lidocaine 1% used for infiltration and patient sedated radial was placed Catheter size: 20 G Hand hygiene performed  and maximum sterile barriers used   Attempts: 1 Procedure performed without using ultrasound guided technique. Following insertion, dressing applied. Post procedure assessment: normal  Patient tolerated the procedure well with no immediate complications.

## 2017-11-06 ENCOUNTER — Encounter (HOSPITAL_COMMUNITY): Payer: Self-pay | Admitting: Dentistry

## 2017-11-06 ENCOUNTER — Ambulatory Visit: Payer: Self-pay | Admitting: Thoracic Surgery (Cardiothoracic Vascular Surgery)

## 2017-11-10 ENCOUNTER — Encounter (HOSPITAL_COMMUNITY): Payer: Self-pay

## 2017-11-10 ENCOUNTER — Other Ambulatory Visit (HOSPITAL_COMMUNITY): Payer: Self-pay

## 2017-11-13 ENCOUNTER — Encounter: Payer: Medicare Other | Admitting: Surgery

## 2017-11-16 ENCOUNTER — Other Ambulatory Visit: Payer: Self-pay

## 2017-11-16 ENCOUNTER — Ambulatory Visit (HOSPITAL_COMMUNITY): Payer: Self-pay | Admitting: Dentistry

## 2017-11-16 ENCOUNTER — Ambulatory Visit (INDEPENDENT_AMBULATORY_CARE_PROVIDER_SITE_OTHER): Payer: Medicare Other | Admitting: Thoracic Surgery (Cardiothoracic Vascular Surgery)

## 2017-11-16 ENCOUNTER — Encounter (HOSPITAL_COMMUNITY): Payer: Self-pay | Admitting: Dentistry

## 2017-11-16 ENCOUNTER — Encounter: Payer: Self-pay | Admitting: Thoracic Surgery (Cardiothoracic Vascular Surgery)

## 2017-11-16 VITALS — BP 125/67 | HR 47 | Temp 97.8°F

## 2017-11-16 VITALS — BP 130/64 | HR 57 | Resp 18 | Ht 69.0 in | Wt 137.0 lb

## 2017-11-16 DIAGNOSIS — K0530 Chronic periodontitis, unspecified: Secondary | ICD-10-CM

## 2017-11-16 DIAGNOSIS — I340 Nonrheumatic mitral (valve) insufficiency: Secondary | ICD-10-CM

## 2017-11-16 DIAGNOSIS — Z01818 Encounter for other preprocedural examination: Secondary | ICD-10-CM

## 2017-11-16 DIAGNOSIS — K08199 Complete loss of teeth due to other specified cause, unspecified class: Secondary | ICD-10-CM

## 2017-11-16 DIAGNOSIS — I051 Rheumatic mitral insufficiency: Secondary | ICD-10-CM

## 2017-11-16 DIAGNOSIS — I5032 Chronic diastolic (congestive) heart failure: Secondary | ICD-10-CM

## 2017-11-16 DIAGNOSIS — N184 Chronic kidney disease, stage 4 (severe): Secondary | ICD-10-CM | POA: Diagnosis not present

## 2017-11-16 DIAGNOSIS — I2510 Atherosclerotic heart disease of native coronary artery without angina pectoris: Secondary | ICD-10-CM

## 2017-11-16 MED ORDER — CHLORHEXIDINE GLUCONATE 0.12 % MT SOLN: OROMUCOSAL | 99 refills | Status: DC

## 2017-11-16 NOTE — Progress Notes (Signed)
POST OPERATIVE NOTE:  11/16/2017 Jocelyn Hill [id number]  VITALS: BP 125/67 (BP Location: Left Arm)   Pulse (!) 47   Temp 97.8 F (36.6 C)   LABS:  Lab Results  Component Value Date   WBC 7.8 11/02/2017   HGB 11.6 (L) 11/05/2017   HCT 34.0 (L) 11/05/2017   MCV 79.0 11/02/2017   PLT 196 11/02/2017   BMET    Component Value Date/Time   NA 143 11/05/2017 0622   K 3.2 (L) 11/05/2017 0622   CL 105 10/26/2017 1459   CO2 26 10/26/2017 1459   GLUCOSE 167 (H) 11/05/2017 0622   BUN 31 (H) 10/26/2017 1459   CREATININE 3.76 (H) 10/26/2017 1459   CALCIUM 9.6 10/26/2017 1459   CALCIUM 9.7 04/21/2017 0231   GFRNONAA 12 (L) 10/26/2017 1459   GFRAA 13 (L) 10/26/2017 1459    Lab Results  Component Value Date   INR 1.05 11/05/2017   INR 0.90 07/03/2017   INR 1.46 04/12/2017   No results found for: PTT   [name] is status post multiple extractions with alveoloplasty and gross debridement of remaining dentition in the operating room on 11/05/2017. Patient now presents for evaluation of healing and suture removal as needed.  SUBJECTIVE: Patient with inimal complaints from dental extraction sites. However, extraction sites are still tender. Patient denies any active bleeding. Stitches are loosely intact.   EXAM: There is no sign of infection, heme, or ooze. Sutures are loosely intact. Patient is healing in by generalized primary closure with several areas healing in by secondary intention.   PROCEDURE: The patient was given a chlorhexidine gluconate rinse for 30 seconds. Sutures were then removed without complication. Patient tolerated the procedure well.  ASSESSMENT: Post operative course is consistent with dental procedures performed in the operating room with general anesthesia. Loss of teeth due to extraction  PLAN: 1.  Use chlorhexidine rinses after breakfast and at bedtime as prescribed. Prescription was sent to [facility] in [place], [place] with  refills for one year. 2. Continue salt water rinses as needed to aid healing. 3.  Advance diet as tolerated 4.  Follow-up with Dr. Roxy Manns later today for discussion of proceeding with heart valve surgery as indicated. The patient could benefit from additional healing from dental extractions sites but Dr. Roxy Manns to proceed with heart valve surgery at his discretion based on patient's symptoms.   Jocelyn Hill, DDS

## 2017-11-16 NOTE — Progress Notes (Signed)
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  The patient originally presented in September 2018 with acute hypoxemic respiratory failure.  Her hospital course was complicated by acute renal failure, atrial fibrillation, retroperitoneal bleed, hydronephrosis requiring placement of percutaneous nephrostomy tube, and E. coli bacteremia.  She was started on anticoagulation for atrial fibrillation but this was stopped secondary to her retroperitoneal hematoma.  She converted to sinus rhythm on amiodarone but amiodarone was stopped secondary to junctional rhythm.  Her creatinine peaked between 5 and 6 but she did not require dialysis.  I had the opportunity to see her in consultation during her initial hospitalization, at which time she was not in any shape to consider elective mitral valve replacement.  She was hospitalized again in October 2018 with another acute exacerbation of chronic diastolic congestive heart failure requiring diuresis.  Since then she has been followed closely by Dr. Aundra Dubin in the advanced heart failure clinic.  I last saw her in the office on June 15, 2017.  Since then the patient underwent elective diagnostic cardiac catheterization on  July 03, 2017.  She was found to have 90% ostial stenosis of the left circumflex coronary artery with 60% stenosis of the proximal left anterior descending coronary artery.  The patient had relatively low right-sided pressures and preserved cardiac output.  Follow-up transthoracic echocardiogram performed September 04, 2017 revealed normal left ventricular systolic function with what was felt to be moderate mitral regurgitation.  Findings were consistent with rheumatic mitral valve disease.  The ERO was measured 0.19 cm corresponding to mitral regurgitant volume estimated 44 mL using PISA.  Mean transvalvular gradient across the mitral valve was estimated 6 mmHg corresponding to mitral valve area estimated 2.3 cm.  TEE performed October 08, 2017 confirmed similar findings.  There was normal left ventricular systolic function with ejection fraction estimated 55 to 60%.  The mitral valve leaflets were thickened with morphology suggestive of rheumatic mitral valve disease.  There was no significant valvular gradient estimated 4 mmHg.  There was severe mitral regurgitation with ER O measured 0.42 cm.  There was flow reversal in the pulmonary veins.  Right ventricular size and function was normal.  The left atrium was dilated.  There was trivial tricuspid regurgitation.  There was a small patent foramen ovale.  Since then the patient underwent dental extraction by Dr. Lawana Chambers and she returns our office today to further discuss the possibility of elective surgical intervention.  The patient lives with 1 of her daughters in [place].  She lives a sedentary lifestyle.  She has been stable although she experiences shortness of breath with low-level activity.  She denies any resting shortness of breath.  She also reports frequent episodes of substernal chest discomfort which she describes as dull pain across her  chest which is brought on with activity but also can occur at rest.  Symptoms are usually mild and transient.  She  states that 2 or 3 times a week she will take sublingual nitroglycerin when the pain gets more severe.  Symptoms always resolve with nitroglycerin.  She denies any resting shortness of breath, PND, orthopnea, or lower extremity edema.  Appetite is marginal.  She still suffers from chronic anxiety and has frequent panic attacks.  She admits that she does not do much at home around the house although she states that she is able to get herself up, get dressed, and bathe herself without assistance.   Current Outpatient Medications  Medication Sig Dispense Refill  . acetaminophen (TYLENOL) 325 MG tablet Take 2 tablets (650 mg total) by mouth every 6 (six) hours as needed for mild pain, fever or headache. 30 tablet 0  . albuterol (PROVENTIL HFA;VENTOLIN HFA) 108 (90 Base) MCG/ACT inhaler Inhale 2 puffs into the lungs every 6 (six) hours as needed for wheezing or shortness of breath.    Marland Kitchen amLODipine (NORVASC) 10 MG tablet Take 1 tablet (10 mg total) by mouth daily. 30 tablet 0  . aspirin EC 81 MG EC tablet Take 1 tablet (81 mg total) by mouth daily. 30 tablet 0  . atorvastatin (LIPITOR) 40 MG tablet Take 1 tablet (40 mg total) by mouth daily. 30 tablet 0  . b complex vitamins tablet Take 1 tablet by mouth daily.    Marland Kitchen buPROPion (WELLBUTRIN) 100 MG tablet Take 100 mg by mouth 3 (three) times daily.    . busPIRone (BUSPAR) 15 MG tablet Take 1 tablet (15 mg total) by mouth 2 (two) times daily. 60 tablet 0  . carvedilol (COREG) 25 MG tablet Take 25 mg by mouth 2 (two) times daily with a meal.    . chlorhexidine (PERIDEX) 0.12 % solution Rinse with 15 mls twice daily for 30 seconds. Use after breakfast and at bedtime. Spit out excess. Do not swallow. 960 mL prn  . cholecalciferol (VITAMIN D) 1000 units tablet Take 1,000 Units by mouth daily.    Marland Kitchen docusate sodium (COLACE) 100 MG capsule Take 1 capsule (100 mg total) by mouth 2 (two) times daily. (Patient taking differently: Take 100 mg by mouth daily. ) 10 capsule  0  . doxazosin (CARDURA) 8 MG tablet Take 1 tablet (8 mg total) by mouth daily. 30 tablet 6  . DULoxetine (CYMBALTA) 30 MG capsule Take 1 capsule (30 mg total) by mouth daily. 30 capsule 0  . furosemide (LASIX) 80 MG tablet Take 1.5 tablets (120 mg total) by mouth 2 (two) times daily. 90 tablet 6  . gabapentin (NEURONTIN) 300 MG capsule Take 300 mg by mouth at bedtime.  11  . hydrALAZINE (APRESOLINE) 100 MG tablet Take 1 tablet (100 mg total) by mouth every 8 (eight) hours. 90 tablet 0  . isosorbide mononitrate (IMDUR) 120 MG 24 hr tablet Take 1 tablet (120 mg total) by mouth daily. 30 tablet 0  . levothyroxine (SYNTHROID, LEVOTHROID) 112 MCG tablet Take 1 tablet (112 mcg total) by mouth daily before breakfast. 30 tablet 0  . lidocaine (LIDODERM) 5 % Place 1 patch onto the skin daily. Remove & Discard patch within 12 hours or as directed by MD 30 patch 0  . magnesium gluconate (MAGONATE) 500 MG tablet Take 0.5 tablets (250 mg total) by mouth daily. 30 tablet 0  . montelukast (SINGULAIR) 10 MG tablet Take 1 tablet (10 mg total) by mouth  daily. (Patient taking differently: Take 10 mg by mouth at bedtime. ) 30 tablet 0  . nitroGLYCERIN (NITROSTAT) 0.4 MG SL tablet Place 1 tablet (0.4 mg total) under the tongue every 5 (five) minutes as needed for chest pain. 30 tablet 0  . Nutritional Supplements (FEEDING SUPPLEMENT, NEPRO CARB STEADY,) LIQD Take 237 mLs by mouth 2 (two) times daily between meals. 237 mL 0  . ondansetron (ZOFRAN) 4 MG tablet Take 1 tablet (4 mg total) by mouth every 8 (eight) hours as needed for nausea or vomiting. 30 tablet 0  . oxyCODONE (OXY IR/ROXICODONE) 5 MG immediate release tablet Take 1 tablet (5 mg total) by mouth every 8 (eight) hours as needed (pain). 6 tablet 0  . oxyCODONE-acetaminophen (PERCOCET) 5-325 MG tablet Take one or two tablets by mouth every 6 hours as needed for pain. 32 tablet 0  . pantoprazole (PROTONIX) 40 MG tablet Take 1 tablet (40 mg total) by mouth 2  (two) times daily before a meal. 60 tablet 0  . polyethylene glycol (MIRALAX / GLYCOLAX) packet Take 17 g by mouth daily as needed (constipation).    . potassium chloride 20 MEQ TBCR Take 20 mEq by mouth daily. 90 tablet 3   No current facility-administered medications for this visit.       Physical Exam:   BP 130/64 (BP Location: Right Arm, Patient Position: Sitting, Cuff Size: Small)   Pulse (!) 57   Resp 18   Ht 5\' 9"  (1.753 m)   Wt 137 lb (62.1 kg)   SpO2 97% Comment: RA  BMI 20.23 kg/m   General:  Deconditioned, thin, and weak in appearance but in no distress  Chest:   Clear to auscultation with symmetrical breath sounds  CV:   Regular rate and rhythm, murmur is difficult to appreciate on exam  Incisions:  n/a  Abdomen:  Soft nontender  Extremities:  Warm and well-perfused, no edema  Diagnostic Tests:  Transthoracic Echocardiography  (Report amended )  Patient:    Jocelyn Hill, Jocelyn Hill MR #:       [id number] Study Date: 09/04/2017 Gender:     F Age:        40 Height:     172.7 cm Weight:     59.4 kg BSA:        1.68 m^2 Pt. Status: Room:   ATTENDING   [name], M.D.  ORDERING    [name], M.D.  REFERRING   [name], M.D.  REFERRING   Antil, Venia Carbon  PERFORMING  [facility], Outpatient  cc:  ------------------------------------------------------------------- LV EF: 55% -   60%  ------------------------------------------------------------------- Indications:      Mitral regurgitation 424.0.  ------------------------------------------------------------------- History:   PMH:  Elevated troponin. History of substance abuse. Rheumatic heart disease.  Atrial fibrillation.  Congestive heart failure.  Mitral valve disease.  Primary pulmonary hypertension. Risk factors:  Hypertension. Dyslipidemia.  ------------------------------------------------------------------- Study Conclusions  - Left ventricle: The cavity size was normal. Wall thickness  was   increased in a pattern of mild LVH. Systolic function was normal.   The estimated ejection fraction was in the range of 55% to 60%.   Wall motion was normal; there were no regional wall motion   abnormalities. Doppler parameters are consistent with abnormal   left ventricular relaxation (grade 1 diastolic dysfunction).   Doppler parameters are consistent with high ventricular filling   pressure. - Mitral valve: Calcified annulus. Mild thickening, consistent with   rheumatic disease. There was moderate regurgitation. Valve  area   by pressure half-time: 2.29 cm^2. - Left atrium: The atrium was severely dilated. - Pulmonary arteries: Systolic pressure was mildly increased. PA   peak pressure: 37 mm Hg (S). - Pericardium, extracardiac: A trivial pericardial effusion was   identified.  Impressions:  - Normal LV systolic function; mild diastolic dysfunction; elevated   LV filling pressure; mild LVH; mild rheumatic appearance of MV;   moderate MR; severe LAE; mild TR; mildly elevated pulmonary   pressure.  ------------------------------------------------------------------- Study data:  Comparison was made to the study of 03/17/2017.  Study status:  Routine.  Procedure:  The patient reported no pain pre or post test. Transthoracic echocardiography. Image quality was adequate.  Study completion:  There were no complications. Transthoracic echocardiography.  M-mode, complete 2D, spectral Doppler, and color Doppler.  Birthdate:  Patient birthdate: 20-Jul-1950.  Age:  Patient is 67 yr old.  Sex:  Gender: female. BMI: 19.9 kg/m^2.  Blood pressure:     160/75  Patient status: Outpatient.  Study date:  Study date: 09/04/2017. Study time: 08:02 AM.  Location:  Echo laboratory.  -------------------------------------------------------------------  ------------------------------------------------------------------- Left ventricle:  The cavity size was normal. Wall thickness  was increased in a pattern of mild LVH. Systolic function was normal. The estimated ejection fraction was in the range of 55% to 60%. Wall motion was normal; there were no regional wall motion abnormalities. Doppler parameters are consistent with abnormal left ventricular relaxation (grade 1 diastolic dysfunction). Doppler parameters are consistent with high ventricular filling pressure.   ------------------------------------------------------------------- Aortic valve:   Trileaflet; mildly thickened leaflets. Mobility was not restricted.  Doppler:  Transvalvular velocity was within the normal range. There was no stenosis. There was no regurgitation.   ------------------------------------------------------------------- Aorta:  Aortic root: The aortic root was normal in size.  ------------------------------------------------------------------- Mitral valve:   Calcified annulus.  Mild thickening, consistent with rheumatic disease. Mobility was not restricted.  Doppler: Transvalvular velocity was within the normal range. There was no evidence for stenosis. There was moderate regurgitation.    Valve area by pressure half-time: 2.29 cm^2. Indexed valve area by pressure half-time: 1.36 cm^2/m^2. Indexed valve area by continuity equation (using LVOT flow): 0.87 cm^2/m^2.    Mean gradient (D): 6 mm Hg. Peak gradient (D): 9 mm Hg.  ------------------------------------------------------------------- Left atrium:  The atrium was severely dilated.  ------------------------------------------------------------------- Right ventricle:  The cavity size was normal. Systolic function was normal.  ------------------------------------------------------------------- Pulmonic valve:    Doppler:  Transvalvular velocity was within the normal range. There was no evidence for stenosis.  ------------------------------------------------------------------- Tricuspid valve:   Structurally normal valve.     Doppler: Transvalvular velocity was within the normal range. There was mild regurgitation.  ------------------------------------------------------------------- Pulmonary artery:   Systolic pressure was mildly increased.  ------------------------------------------------------------------- Right atrium:  The atrium was normal in size.  ------------------------------------------------------------------- Pericardium:  A trivial pericardial effusion was identified.  ------------------------------------------------------------------- Systemic veins: Inferior vena cava: The vessel was normal in size.  ------------------------------------------------------------------- Measurements   Left ventricle                           Value          Reference  LV ID, ED, PLAX chordal                  46.5  mm       43 - 52  LV ID, ES, PLAX chordal  34.4  mm       23 - 38  LV fx shortening, PLAX chordal    (L)    26    %        >=29  LV PW thickness, ED                      11.94 mm       ----------  IVS/LV PW ratio, ED                      1.13           <=1.3  Stroke volume, 2D                        93    ml       ----------  Stroke volume/bsa, 2D                    55    ml/m^2   ----------  LV ejection fraction, 1-p A4C            73    %        ----------  LV end-diastolic volume, 2-p             104   ml       ----------  LV end-systolic volume, 2-p              34    ml       ----------  LV ejection fraction, 2-p                68    %        ----------  Stroke volume, 2-p                       70    ml       ----------  LV end-diastolic volume/bsa, 2-p         62    ml/m^2   ----------  LV end-systolic volume/bsa, 2-p          20    ml/m^2   ----------  Stroke volume/bsa, 2-p                   41.7  ml/m^2   ----------  LV e&', lateral                           8.81  cm/s     ----------  LV E/e&', lateral                         17.25          ----------  LV e&',  medial                            6.64  cm/s     ----------  LV E/e&', medial                          22.89          ----------  LV e&', average                           7.73  cm/s     ----------  LV E/e&', average                         19.68          ----------  Longitudinal strain, TDI                 24    %        ----------    Ventricular septum                       Value          Reference  IVS thickness, ED                        13.5  mm       ----------    LVOT                                     Value          Reference  LVOT ID, S                               20    mm       ----------  LVOT area                                3.14  cm^2     ----------  LVOT peak velocity, S                    133   cm/s     ----------  LVOT mean velocity, S                    97.1  cm/s     ----------  LVOT VTI, S                              29.7  cm       ----------  LVOT peak gradient, S                    7     mm Hg    ----------    Aorta                                    Value          Reference  Aortic root ID, ED                       32    mm       ----------  Ascending aorta ID, A-P, S               31    mm       ----------    Left atrium                              Value  Reference  LA ID, A-P, ES                           36    mm       ----------  LA ID/bsa, A-P                           2.14  cm/m^2   <=2.2  LA volume, S                             112   ml       ----------  LA volume/bsa, S                         66.6  ml/m^2   ----------  LA volume, ES, 1-p A4C                   114   ml       ----------  LA volume/bsa, ES, 1-p A4C               67.8  ml/m^2   ----------  LA volume, ES, 1-p A2C                   110   ml       ----------  LA volume/bsa, ES, 1-p A2C               65.4  ml/m^2   ----------    Mitral valve                             Value          Reference  Mitral E-wave peak velocity              152   cm/s     ----------  Mitral A-wave  peak velocity              178   cm/s     ----------  Mitral mean velocity, D                  112   cm/s     ----------  Mitral deceleration time          (H)    303   ms       150 - 230  Mitral pressure half-time                102   ms       ----------  Mitral mean gradient, D                  6     mm Hg    ----------  Mitral peak gradient, D                  9     mm Hg    ----------  Mitral E/A ratio, peak                   0.9            ----------  Mitral valve area, PHT, DP               2.29  cm^2     ----------  Mitral valve area/bsa, PHT, DP           1.36  cm^2/m^2 ----------  Mitral valve area/bsa, LVOT              0.87  cm^2/m^2 ----------  continuity  Mitral annulus VTI, D                    63.5  cm       ----------  Mitral maximal regurg velocity,          610   cm/s     ----------  PISA  Mitral regurg VTI, PISA                  230   cm       ----------  Mitral ERO, PISA                         0.19  cm^2     ----------  Mitral regurg volume, PISA               44    ml       ----------    Pulmonary arteries                       Value          Reference  PA pressure, S, DP                (H)    37    mm Hg    <=30    Tricuspid valve                          Value          Reference  Tricuspid regurg peak velocity           269   cm/s     ----------  Tricuspid peak RV-RA gradient            29    mm Hg    ----------    Right atrium                             Value          Reference  RA ID, S-I, ES, A4C               (H)    57.4  mm       34 - 49  RA area, ES, A4C                         19    cm^2     8.3 - 19.5  RA volume, ES, A/L                       54.2  ml       ----------  RA volume/bsa, ES, A/L                   32.2  ml/m^2   ----------    Systemic veins                           Value          Reference  Estimated CVP  8     mm Hg    ----------    Right ventricle                          Value          Reference  TAPSE                                     24    mm       ----------  RV pressure, S, DP                (H)    37    mm Hg    <=30  Legend: (L)  and  (H)  mark values outside specified reference range.  ------------------------------------------------------------------- Karie Georges 2019-03-11T12:41:30   Transesophageal Echocardiography  Patient:    Ilma, Achee MR #:       [id number] Study Date: 10/08/2017 Gender:     F Age:        22 Height:     172.7 cm Weight:     60.3 kg BSA:        1.7 m^2 Pt. Status: Room:   ADMITTING    [name], M.D.  ATTENDING    [name], M.D.  ORDERING     [name], M.D.  PERFORMING   [name], M.D.  REFERRING    [name], M.D.  SONOGRAPHER  Merrie Roof, RDCS  cc:  ------------------------------------------------------------------- LV EF: 55% -   60%  ------------------------------------------------------------------- Indications:      Mitral regurgitation 424.0.  ------------------------------------------------------------------- History:   Risk factors:  Hypertension. Diabetes mellitus. Dyslipidemia.  ------------------------------------------------------------------- Study Conclusions  - Left ventricle: The cavity size was normal. Wall thickness was   increased in a pattern of mild LVH. Systolic function was normal.   The estimated ejection fraction was in the range of 55% to 60%.   Wall motion was normal; there were no regional wall motion   abnormalities. - Aortic valve: There was no stenosis. There was trivial   regurgitation. - Aorta: Grade 4 plaque in the descending thoracic aorta and arch. - Mitral valve: The mitral valve was thickened towards the leaflet   tips, morphology suggests rheumatic heart disease. There was no   significant stenosis, mean gradient 4 mmHg. There was severe   mitral regurgitation with ERO 0.42 cm^2. There was systolic flow   reversal in 1 out of 2 pulmonary  veins interrogated. - Left atrium: The atrium was mildly to moderately dilated. No   evidence of thrombus in the atrial cavity or appendage. - Right ventricle: The cavity size was normal. Systolic function   was normal. - Right atrium: No evidence of thrombus in the atrial cavity or   appendage. - Atrial septum: There was a small PFO by color doppler but bubble   study was negative. - Tricuspid valve: Trivial TR, peak RV-RA gradient 37 mmHg. 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  ------------------------------------------------------------------- Study data:   Study status:  Routine.  Consent:  The risks, benefits, and alternatives to the procedure were explained to the patient and informed consent was obtained.  Procedure:  The patient reported no pain pre or post test. Initial setup. The patient was brought to the laboratory. Surface ECG leads were monitored. Sedation. Moderate  sedation was administered by cardiology staff. Transesophageal echocardiography. A transesophageal probe was inserted by the attending cardiologistwithout difficulty. Image quality was adequate. Intravenous contrast (agitated saline) was administered.  Study completion:  The patient tolerated the procedure well. There were no complications.  Administered medications:   Fentanyl, 71mcg, IV.  Midazolam, 5mg , IV. Diagnostic transesophageal echocardiography.  2D and color Doppler.  Birthdate:  Patient birthdate: 21-May-1951.  Age:  Patient is 67 yr old.  Sex:  Gender: female.    BMI: 20.2 kg/m^2.  Blood pressure:   161/91  Patient status:  Outpatient.  Study date:  Study date: 10/08/2017. Study time: 08:05 AM.  Location:  Endoscopy.  -------------------------------------------------------------------  ------------------------------------------------------------------- Left ventricle:  The cavity size was normal. Wall thickness  was increased in a pattern of mild LVH. Systolic function was normal. The estimated ejection fraction was in the range of 55% to 60%. Wall motion was normal; there were no regional wall motion abnormalities.  ------------------------------------------------------------------- Aortic valve:   Trileaflet.  Doppler:   There was no stenosis. There was trivial regurgitation.  ------------------------------------------------------------------- Aorta:  Grade 4 plaque in the descending thoracic aorta and arch.   ------------------------------------------------------------------- Mitral valve:  The mitral valve was thickened towards the leaflet tips, morphology suggests rheumatic heart disease. There was no significant stenosis, mean gradient 4 mmHg. There was severe mitral regurgitation with ERO 0.42 cm^2. There was systolic flow reversal in 1 out of 2 pulmonary veins interrogated.  Doppler:     Mean gradient (D): 4 mm Hg.  ------------------------------------------------------------------- Left atrium:  The atrium was mildly to moderately dilated.  No evidence of thrombus in the atrial cavity or appendage.  ------------------------------------------------------------------- Atrial septum:  There was a small PFO by color doppler but bubble study was negative.  ------------------------------------------------------------------- Right ventricle:  The cavity size was normal. Systolic function was normal.  ------------------------------------------------------------------- Pulmonic valve:    Doppler:  There was trivial regurgitation.  ------------------------------------------------------------------- Tricuspid valve:  Trivial TR, peak RV-RA gradient 37 mmHg.  ------------------------------------------------------------------- Right atrium:  The atrium was normal in size.  No evidence of thrombus in the atrial cavity or  appendage.  ------------------------------------------------------------------- Pericardium:  There was no pericardial effusion.   ------------------------------------------------------------------- Post procedure conclusions Ascending Aorta:  - Grade 4 plaque in the descending thoracic aorta and arch.  ------------------------------------------------------------------- Measurements   Mitral valve                 Value  Mitral mean velocity, D      99.3  cm/s  Mitral mean gradient, D      4     mm Hg  Mitral annulus VTI, D        43.7  cm  Mitral regurg VTI, PISA      245   cm  Legend: (L)  and  (H)  mark values outside specified reference range.  ------------------------------------------------------------------- Prepared and Electronically Authenticated by  [name], M.D. 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  She describes stable symptoms of exertional shortness of breath consistent with chronic diastolic congestive heart failure, [place] Heart Association functional class III.  She is also having intermittent episodes of chest discomfort that are at least partly relieved by nitroglycerin and potentially consistent with stable angina pectoris.  Some of her symptoms are difficult to sort out because of her underlying chronic  problems with severe anxiety and depression.  Risks associated with conventional surgical mitral valve replacement and coronary artery bypass grafting would unquestionably be very high because of her numerous comorbid medical conditions including her chronic kidney disease which is rapidly approaching end-stage, severe anxiety and depression with emotional instability, and severe physical deconditioning.  I am concerned that  she might do quite poorly with conventional surgery, although I agree that she clearly will not do well without some type of intervention in the near future.  She has been referred for establishment of dialysis access.  She has not yet undergone venogram nor has she been seen by a vascular surgeon for possible primary AV fistula creation.  I feel that if we proceed with surgery in the near future she will likely require initiation of hemodialysis postoperatively.  Plan:  I have discussed matters at length with the patient in the office today.  Her daughters and primary caregivers were not able to come with her for her office appointment today.  We discussed the potential high risks associated with conventional surgery but concerns regarding her long-term prognosis without some type of surgical intervention.  Her case and imaging studies will be reviewed by a multidisciplinary team of specialists to see whether or not less invasive options might be feasible, such as PCI and stenting of the left circumflex coronary artery followed by palliative edge to edge MitraClip repair.  The patient will return to our office in 2 weeks to discuss treatment options further.  She has been instructed to make sure that her close family members will be able to accompany her for that appointment so that we can establish a more definitive plan for treatment.  All of her questions have been addressed.  I spent in excess of 15 minutes during the conduct of this office consultation and >50% of this time involved direct face-to-face encounter with the patient for counseling and/or coordination of their care.   Valentina Gu. Roxy Manns, MD 11/16/2017 12:59 PM

## 2017-11-16 NOTE — Patient Instructions (Addendum)
PLAN: 1.  Use chlorhexidine rinses after breakfast and at bedtime as prescribed. Prescription was sent to [facility] in [place], [place] with refills for one year. 2. Continue salt water rinses as needed to aid healing. 3.  Advance diet as tolerated 4.  Follow-up with Dr. Roxy Manns later today for discussion of proceeding with heart valve surgery as indicated. The patient could benefit from additional healing from dental extractions sites but Dr. Roxy Manns to proceed with heart valve surgery at his discretion based on patient's symptoms.

## 2017-11-16 NOTE — Patient Instructions (Signed)
Continue all previous medications without any changes at this time  Call EMS or go to Emergency Room if you develop chest pain unrelieved by administration of nitroglycerin

## 2017-11-30 ENCOUNTER — Other Ambulatory Visit: Payer: Self-pay

## 2017-11-30 ENCOUNTER — Encounter: Payer: Self-pay | Admitting: Thoracic Surgery (Cardiothoracic Vascular Surgery)

## 2017-11-30 ENCOUNTER — Ambulatory Visit: Payer: Medicare Other | Admitting: Thoracic Surgery (Cardiothoracic Vascular Surgery)

## 2017-11-30 ENCOUNTER — Other Ambulatory Visit: Payer: Self-pay | Admitting: *Deleted

## 2017-11-30 VITALS — BP 150/70 | HR 61 | Resp 16 | Ht 69.0 in | Wt 137.0 lb

## 2017-11-30 DIAGNOSIS — I340 Nonrheumatic mitral (valve) insufficiency: Secondary | ICD-10-CM | POA: Diagnosis not present

## 2017-11-30 DIAGNOSIS — I5032 Chronic diastolic (congestive) heart failure: Secondary | ICD-10-CM | POA: Diagnosis not present

## 2017-11-30 DIAGNOSIS — I2510 Atherosclerotic heart disease of native coronary artery without angina pectoris: Secondary | ICD-10-CM | POA: Diagnosis not present

## 2017-11-30 DIAGNOSIS — N184 Chronic kidney disease, stage 4 (severe): Secondary | ICD-10-CM

## 2017-11-30 DIAGNOSIS — I051 Rheumatic mitral insufficiency: Secondary | ICD-10-CM | POA: Diagnosis not present

## 2017-11-30 NOTE — Patient Instructions (Addendum)
Continue all previous medications without any changes at this time  Stop taking Eliquis and all other blood thinners at least 7 days prior to surgery (December 18, 2017)

## 2017-11-30 NOTE — Progress Notes (Signed)
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  The patient originally presented in September 2018 with acute hypoxemic respiratory failure.  Her hospital course was complicated by acute renal failure, atrial fibrillation, retroperitoneal bleed, hydronephrosis requiring placement of percutaneous nephrostomy tube, and E. coli bacteremia.  She was started on anticoagulation for atrial fibrillation but this was stopped secondary to her retroperitoneal hematoma.  She converted to sinus rhythm on amiodarone but amiodarone was stopped secondary to junctional rhythm.  Her creatinine peaked between 5 and 6 but she did not require dialysis.  I had the opportunity to see her in consultation during her initial hospitalization, at which time she was not in any shape to consider elective mitral valve replacement.  She was hospitalized again in October 2018 with another acute exacerbation of chronic diastolic congestive heart failure requiring diuresis.  Since then she has been followed closely by Dr. Aundra Dubin in the advanced heart failure clinic.  I last saw her in the office on June 15, 2017.  Since then the patient underwent  elective diagnostic cardiac catheterization on July 03, 2017.  She was found to have 90% ostial stenosis of the left circumflex coronary artery with 60% stenosis of the proximal left anterior descending coronary artery.  The patient had relatively low right-sided pressures and preserved cardiac output.  Follow-up transthoracic echocardiogram performed September 04, 2017 revealed normal left ventricular systolic function with what was felt to be moderate mitral regurgitation.  Findings were consistent with rheumatic mitral valve disease.  The ERO was measured 0.19 cm corresponding to mitral regurgitant volume estimated 44 mL using PISA.  Mean transvalvular gradient across the mitral valve was estimated 6 mmHg corresponding to mitral valve area estimated 2.3 cm.  TEE performed October 08, 2017 confirmed similar findings.  There was normal left ventricular systolic function with ejection fraction estimated 55 to 60%.  The mitral valve leaflets were thickened with morphology suggestive of rheumatic mitral valve disease.  There was no significant valvular gradient estimated 4 mmHg.  There was severe mitral regurgitation with ER O measured 0.42 cm.  There was flow reversal in the pulmonary veins.  Right ventricular size and function was normal.  The left atrium was dilated.  There was trivial tricuspid regurgitation.  There was a small patent foramen ovale.  Since then the patient underwent dental extraction by Dr. Lawana Chambers and was most recently seen in our office for follow-up on Nov 16, 2017.  Since that time her TEE and diagnostic catheterization have been reviewed by a multidisciplinary team of specialists to discuss treatment options including high risk conventional surgery for mitral valve replacement and coronary artery bypass grafting versus PCI and stenting of the left circumflex coronary artery with edge to edge MitraClip.  She returns to our  office today with most of her immediate family present to discuss  treatment options further.  The patient lives with 1 of her daughters in [place].  She lives a sedentary lifestyle.  She has been stable although she experiences shortness of breath with low-level activity.  She denies any resting shortness of breath.  She also reports frequent episodes of substernal chest discomfort which she describes as dull pain across her chest which is brought on with activity but also can occur at rest.  Symptoms are usually mild and transient.  She states that 2 or 3 times a week she will take sublingual nitroglycerin when the pain gets more severe.  Symptoms always resolve with nitroglycerin.  She denies any resting shortness of breath, PND, orthopnea, or lower extremity edema.  Appetite is marginal.  She still suffers from chronic anxiety and has frequent panic attacks.  She admits that she does not do much at home around the house although she states that she is able to get herself up, get dressed, and bathe herself without assistance.    Current Outpatient Medications  Medication Sig Dispense Refill  . acetaminophen (TYLENOL) 325 MG tablet Take 2 tablets (650 mg total) by mouth every 6 (six) hours as needed for mild pain, fever or headache. 30 tablet 0  . albuterol (PROVENTIL HFA;VENTOLIN HFA) 108 (90 Base) MCG/ACT inhaler Inhale 2 puffs into the lungs every 6 (six) hours as needed for wheezing or shortness of breath.    Marland Kitchen amLODipine (NORVASC) 10 MG tablet Take 1 tablet (10 mg total) by mouth daily. 30 tablet 0  . aspirin EC 81 MG EC tablet Take 1 tablet (81 mg total) by mouth daily. 30 tablet 0  . atorvastatin (LIPITOR) 40 MG tablet Take 1 tablet (40 mg total) by mouth daily. 30 tablet 0  . b complex vitamins tablet Take 1 tablet by mouth daily.    Marland Kitchen buPROPion (WELLBUTRIN) 100 MG tablet Take 100 mg by mouth 3 (three) times daily.    . busPIRone (BUSPAR) 15 MG tablet Take 1 tablet (15 mg total) by mouth 2 (two) times daily. 60 tablet 0  . carvedilol (COREG) 25 MG  tablet Take 25 mg by mouth 2 (two) times daily with a meal.    . chlorhexidine (PERIDEX) 0.12 % solution Rinse with 15 mls twice daily for 30 seconds. Use after breakfast and at bedtime. Spit out excess. Do not swallow. 960 mL prn  . cholecalciferol (VITAMIN D) 1000 units tablet Take 1,000 Units by mouth daily.    Marland Kitchen docusate sodium (COLACE) 100 MG capsule Take 1 capsule (100 mg total) by mouth 2 (two) times daily. (Patient taking differently: Take 100 mg by mouth daily. ) 10 capsule 0  . doxazosin (CARDURA) 8 MG tablet Take 1 tablet (8 mg total) by mouth daily. 30 tablet 6  . DULoxetine (CYMBALTA) 30 MG capsule Take 1 capsule (30 mg total) by mouth daily. 30 capsule 0  . furosemide (LASIX) 80 MG tablet Take 1.5 tablets (120 mg total) by mouth 2 (two) times daily. 90 tablet 6  . gabapentin (NEURONTIN) 300 MG capsule Take 300 mg by mouth at bedtime.  11  . hydrALAZINE (APRESOLINE) 100 MG tablet Take 1 tablet (100 mg total) by mouth every 8 (eight) hours. 90 tablet 0  . isosorbide mononitrate (IMDUR) 120 MG 24 hr tablet Take 1 tablet (120 mg total) by mouth daily. 30 tablet 0  . levothyroxine (SYNTHROID, LEVOTHROID) 112 MCG tablet Take 1 tablet (112 mcg  total) by mouth daily before breakfast. 30 tablet 0  . lidocaine (LIDODERM) 5 % Place 1 patch onto the skin daily. Remove & Discard patch within 12 hours or as directed by MD 30 patch 0  . magnesium gluconate (MAGONATE) 500 MG tablet Take 0.5 tablets (250 mg total) by mouth daily. 30 tablet 0  . montelukast (SINGULAIR) 10 MG tablet Take 1 tablet (10 mg total) by mouth daily. (Patient taking differently: Take 10 mg by mouth at bedtime. ) 30 tablet 0  . nitroGLYCERIN (NITROSTAT) 0.4 MG SL tablet Place 1 tablet (0.4 mg total) under the tongue every 5 (five) minutes as needed for chest pain. 30 tablet 0  . Nutritional Supplements (FEEDING SUPPLEMENT, NEPRO CARB STEADY,) LIQD Take 237 mLs by mouth 2 (two) times daily between meals. 237 mL 0  . ondansetron  (ZOFRAN) 4 MG tablet Take 1 tablet (4 mg total) by mouth every 8 (eight) hours as needed for nausea or vomiting. 30 tablet 0  . oxyCODONE (OXY IR/ROXICODONE) 5 MG immediate release tablet Take 1 tablet (5 mg total) by mouth every 8 (eight) hours as needed (pain). 6 tablet 0  . oxyCODONE-acetaminophen (PERCOCET) 5-325 MG tablet Take one or two tablets by mouth every 6 hours as needed for pain. 32 tablet 0  . pantoprazole (PROTONIX) 40 MG tablet Take 1 tablet (40 mg total) by mouth 2 (two) times daily before a meal. 60 tablet 0  . polyethylene glycol (MIRALAX / GLYCOLAX) packet Take 17 g by mouth daily as needed (constipation).    . potassium chloride 20 MEQ TBCR Take 20 mEq by mouth daily. 90 tablet 3   No current facility-administered medications for this visit.       Physical Exam:   BP (!) 150/70 (BP Location: Left Arm, Patient Position: Sitting, Cuff Size: Normal)   Pulse 61   Resp 16   Ht 5\' 9"  (1.753 m)   Wt 137 lb (62.1 kg)   SpO2 96% Comment: ON RA  BMI 20.23 kg/m   General:  Well appearing but anxious  Chest:   clear  CV:   RRR w/ systolic murmur  Incisions:  n/a  Abdomen:  soft  Extremities:  warm  Diagnostic Tests:  RIGHT/LEFT HEART CATH AND CORONARY ANGIOGRAPHY  Conclusion   1. Low filling pressure and preserved cardiac output.  2. 90% ostial LCx stenosis.  3. Napkin ring-like stenosis in the proximal LAD, at least 60% stenosis.   50 cc contrast used.  Patient was hydrated before, during and will be hydrated post case.    I will decrease her Lasix at home to 120 mg bid, will not start back on Lasix until Sunday.  She will need BMET early next week.   Patient will have MV surgery.  She will need CABG with grafting of LCx +/- LAD.   Procedural Details/Technique   Technical Details Procedure: Right Heart Cath, Left Heart Cath, Selective Coronary Angiography  Indication: Pre-MV surgery.   Patient has CKD IV, today creatinine is 3.1 which appears to be her  current baseline. She was pre-hydrated. I discussed risk of AKI extensively with patient and daughter prior to procedure and they understand the risk. She has HF from severe MR, and to get to valve surgery needs coronary evaluation.   Procedural Details: The right groin and right radial area were prepped, draped, and anesthetized with 1% lidocaine. Using the modified Seldinger technique a 7 French sheath was placed in the right femoral vein. A Swan-Ganz catheter  was used for the right heart catheterization. Standard protocol was followed for recording of right heart pressures and sampling of oxygen saturations. Fick and thermodilution cardiac output was calculated. The right radial artery was entered using modified Seldinger technique and a 15F sheath was placed. The patient received 3 mg IA verapamil and weight-based IV heparin. Standard Judkins catheters were used for selective coronary angiography. There were no immediate procedural complications. The patient was transferred to the post catheterization recovery area for further monitoring.  50 cc contrast used.   Estimated blood loss <50 mL.  During this procedure the patient was administered the following to achieve and maintain moderate conscious sedation: Versed 1 mg, Fentanyl 25 mcg, while the patient's heart rate, blood pressure, and oxygen saturation were continuously monitored. The period of conscious sedation was 37 minutes, of which I was present face-to-face 100% of this time.  Coronary Findings   Diagnostic  Dominance: Right  Left Main  30% distal left main stenosis.  Left Anterior Descending  Napkin ring-like proximal LAD stenosis with post-stenotic dilatation. Appears approximately 60% but could be worse. Luminal irregularities in the remainder of the LAD.  Left Circumflex  There is a 90% ostial LCx stenosis. Luminal irregularities in the rest of the vessel.  Right Coronary Artery  40% proximal RCA stenosis.  Intervention   No  interventions have been documented.  Right Heart   Right Heart Pressures RHC Procedural Findings: Hemodynamics (mmHg) RA mean 2 RV 29/2 PA 37/12, mean 26 PCWP mean 13 LV 161/5 AO 158/76  Oxygen saturations: PA 73% AO 99%  Cardiac Output (Fick) 5.07  Cardiac Index (Fick) 2.98  Cardiac Output (Thermo) 5.88 Cardiac Index (Thermo) 3.46  Coronary Diagrams   Diagnostic Diagram       Implants    No implant documentation for this case.  MERGE Images   Show images for CARDIAC CATHETERIZATION   Link to Procedure Log   Procedure Log    Hemo Data    Most Recent Value  Fick Cardiac Output 5.07 L/min  Fick Cardiac Output Index 2.98 (L/min)/BSA  Thermal Cardiac Output 5.88 L/min  Thermal Cardiac Output Index 3.46 (L/min)/BSA  RA A Wave 5 mmHg  RA V Wave 3 mmHg  RA Mean 2 mmHg  RV Systolic Pressure 29 mmHg  RV Diastolic Pressure 0 mmHg  RV EDP 2 mmHg  PA Systolic Pressure 37 mmHg  PA Diastolic Pressure 12 mmHg  PA Mean 26 mmHg  PW A Wave 22 mmHg  PW V Wave 24 mmHg  PW Mean 13 mmHg  AO Systolic Pressure 427 mmHg  AO Diastolic Pressure 76 mmHg  AO Mean 062 mmHg  LV Systolic Pressure 376 mmHg  LV Diastolic Pressure 0 mmHg  LV EDP 5 mmHg  TPVR Index 7.51 HRUI  TSVR Index 31.51 HRUI  PVR SVR Ratio 0.12  TPVR/TSVR Ratio 0.24       Transthoracic Echocardiography  (Report amended )  Patient: Jocelyn Hill, Jocelyn Hill MR #: [id number] Study Date: 09/04/2017 Gender: F Age: 70 Height: 172.7 cm Weight: 59.4 kg BSA: 1.68 m^2 Pt. Status: Room:  ATTENDING [name], M.D. ORDERING [name], M.D. REFERRING [name], M.D. REFERRING Antil, Venia Carbon PERFORMING [facility], Outpatient  cc:  ------------------------------------------------------------------- LV EF: 55% - 60%  ------------------------------------------------------------------- Indications: Mitral regurgitation  424.0.  ------------------------------------------------------------------- History: PMH: Elevated troponin. History of substance abuse. Rheumatic heart disease. Atrial fibrillation. Congestive heart failure. Mitral valve disease. Primary pulmonary hypertension. Risk factors: Hypertension. Dyslipidemia.  ------------------------------------------------------------------- Study Conclusions  - Left  ventricle: The cavity size was normal. Wall thickness was increased in a pattern of mild LVH. Systolic function was normal. The estimated ejection fraction was in the range of 55% to 60%. Wall motion was normal; there were no regional wall motion abnormalities. Doppler parameters are consistent with abnormal left ventricular relaxation (grade 1 diastolic dysfunction). Doppler parameters are consistent with high ventricular filling pressure. - Mitral valve: Calcified annulus. Mild thickening, consistent with rheumatic disease. There was moderate regurgitation. Valve area by pressure half-time: 2.29 cm^2. - Left atrium: The atrium was severely dilated. - Pulmonary arteries: Systolic pressure was mildly increased. PA peak pressure: 37 mm Hg (S). - Pericardium, extracardiac: A trivial pericardial effusion was identified.  Impressions:  - Normal LV systolic function; mild diastolic dysfunction; elevated LV filling pressure; mild LVH; mild rheumatic appearance of MV; moderate MR; severe LAE; mild TR; mildly elevated pulmonary pressure.  ------------------------------------------------------------------- Study data: Comparison was made to the study of 03/17/2017. Study status: Routine. Procedure: The patient reported no pain pre or post test. Transthoracic echocardiography. Image quality was adequate. Study completion: There were no complications. Transthoracic echocardiography. M-mode, complete 2D, spectral Doppler, and color Doppler.  Birthdate: Patient birthdate: 1951/02/02. Age: Patient is 67 yr old. Sex: Gender: female. BMI: 19.9 kg/m^2. Blood pressure: 160/75 Patient status: Outpatient. Study date: Study date: 09/04/2017. Study time: 08:02 AM. Location: Echo laboratory.  -------------------------------------------------------------------  ------------------------------------------------------------------- Left ventricle: The cavity size was normal. Wall thickness was increased in a pattern of mild LVH. Systolic function was normal. The estimated ejection fraction was in the range of 55% to 60%. Wall motion was normal; there were no regional wall motion abnormalities. Doppler parameters are consistent with abnormal left ventricular relaxation (grade 1 diastolic dysfunction). Doppler parameters are consistent with high ventricular filling pressure.  ------------------------------------------------------------------- Aortic valve: Trileaflet; mildly thickened leaflets. Mobility was not restricted. Doppler: Transvalvular velocity was within the normal range. There was no stenosis. There was no regurgitation.  ------------------------------------------------------------------- Aorta: Aortic root: The aortic root was normal in size.  ------------------------------------------------------------------- Mitral valve: Calcified annulus. Mild thickening, consistent with rheumatic disease. Mobility was not restricted. Doppler: Transvalvular velocity was within the normal range. There was no evidence for stenosis. There was moderate regurgitation. Valve area by pressure half-time: 2.29 cm^2. Indexed valve area by pressure half-time: 1.36 cm^2/m^2. Indexed valve area by continuity equation (using LVOT flow): 0.87 cm^2/m^2. Mean gradient (D): 6 mm Hg. Peak gradient (D): 9 mm Hg.  ------------------------------------------------------------------- Left atrium: The atrium was severely  dilated.  ------------------------------------------------------------------- Right ventricle: The cavity size was normal. Systolic function was normal.  ------------------------------------------------------------------- Pulmonic valve: Doppler: Transvalvular velocity was within the normal range. There was no evidence for stenosis.  ------------------------------------------------------------------- Tricuspid valve: Structurally normal valve. Doppler: Transvalvular velocity was within the normal range. There was mild regurgitation.  ------------------------------------------------------------------- Pulmonary artery: Systolic pressure was mildly increased.  ------------------------------------------------------------------- Right atrium: The atrium was normal in size.  ------------------------------------------------------------------- Pericardium: A trivial pericardial effusion was identified.  ------------------------------------------------------------------- Systemic veins: Inferior vena cava: The vessel was normal in size.  ------------------------------------------------------------------- Measurements  Left ventricle Value Reference LV ID, ED, PLAX chordal 46.5 mm 43 - 52 LV ID, ES, PLAX chordal 34.4 mm 23 - 38 LV fx shortening, PLAX chordal (L) 26 % >=29 LV PW thickness, ED 11.94 mm ---------- IVS/LV PW ratio, ED 1.13 <=1.3 Stroke volume, 2D 93 ml ---------- Stroke volume/bsa, 2D 55 ml/m^2 ---------- LV ejection fraction, 1-p A4C 73 % ---------- LV end-diastolic volume, 2-p 720 ml ---------- LV end-systolic volume, 2-p 34 ml ----------  LV ejection fraction, 2-p  68 % ---------- Stroke volume, 2-p 70 ml ---------- LV end-diastolic volume/bsa, 2-p 62 ml/m^2 ---------- LV end-systolic volume/bsa, 2-p 20 ml/m^2 ---------- Stroke volume/bsa, 2-p 41.7 ml/m^2 ---------- LV e&', lateral 8.81 cm/s ---------- LV E/e&', lateral 17.25 ---------- LV e&', medial 6.64 cm/s ---------- LV E/e&', medial 22.89 ---------- LV e&', average 7.73 cm/s ---------- LV E/e&', average 19.68 ---------- Longitudinal strain, TDI 24 % ----------  Ventricular septum Value Reference IVS thickness, ED 13.5 mm ----------  LVOT Value Reference LVOT ID, S 20 mm ---------- LVOT area 3.14 cm^2 ---------- LVOT peak velocity, S 133 cm/s ---------- LVOT mean velocity, S 97.1 cm/s ---------- LVOT VTI, S 29.7 cm ---------- LVOT peak gradient, S 7 mm Hg ----------  Aorta Value Reference Aortic root ID, ED 32 mm ---------- Ascending aorta ID, A-P, S 31 mm ----------  Left atrium Value Reference LA ID, A-P, ES 36 mm ---------- LA ID/bsa, A-P 2.14 cm/m^2 <=2.2 LA volume, S 112 ml ---------- LA volume/bsa, S  66.6 ml/m^2 ---------- LA volume, ES, 1-p A4C 114 ml ---------- LA volume/bsa, ES, 1-p A4C 67.8 ml/m^2 ---------- LA volume, ES, 1-p A2C 110 ml ---------- LA volume/bsa, ES, 1-p A2C 65.4 ml/m^2 ----------  Mitral valve Value Reference Mitral E-wave peak velocity 152 cm/s ---------- Mitral A-wave peak velocity 178 cm/s ---------- Mitral mean velocity, D 112 cm/s ---------- Mitral deceleration time (H) 303 ms 150 - 230 Mitral pressure half-time 102 ms ---------- Mitral mean gradient, D 6 mm Hg ---------- Mitral peak gradient, D 9 mm Hg ---------- Mitral E/A ratio, peak 0.9 ---------- Mitral valve area, PHT, DP 2.29 cm^2 ---------- Mitral valve area/bsa, PHT, DP 1.36 cm^2/m^2 ---------- Mitral valve area/bsa, LVOT 0.87 cm^2/m^2 ---------- continuity Mitral annulus VTI, D 63.5 cm ---------- Mitral maximal regurg velocity, 610 cm/s ---------- PISA Mitral regurg VTI, PISA 230 cm ---------- Mitral ERO, PISA 0.19 cm^2 ---------- Mitral regurg volume, PISA 44 ml ----------  Pulmonary arteries Value Reference PA pressure, S, DP (H) 37 mm Hg <=30  Tricuspid valve Value Reference Tricuspid regurg peak velocity 269 cm/s ---------- Tricuspid peak RV-RA gradient 29 mm Hg ----------  Right atrium Value Reference RA ID, S-I,  ES, A4C (H) 57.4 mm 34 - 49 RA area, ES, A4C 19 cm^2 8.3 - 19.5 RA volume, ES, A/L 54.2 ml ---------- RA volume/bsa, ES, A/L 32.2 ml/m^2 ----------  Systemic veins Value Reference Estimated CVP 8 mm Hg ----------  Right ventricle Value Reference TAPSE 24 mm ---------- RV pressure, S, DP (H) 37 mm Hg <=30  Legend: (L) and (H) mark values outside specified reference range.  ------------------------------------------------------------------- Karie Georges 2019-03-11T12:41:30   Transesophageal Echocardiography  Patient: Emine, Lopata MR #: [id number] Study Date: 10/08/2017 Gender: F Age: 72 Height: 172.7 cm Weight: 60.3 kg BSA: 1.7 m^2 Pt. Status: Room:  ADMITTING [name], M.D. ATTENDING [name], M.D. ORDERING [name], M.D. PERFORMING [name], M.D. REFERRING [name], M.D. SONOGRAPHER Merrie Roof, RDCS  cc:  ------------------------------------------------------------------- LV EF: 55% - 60%  ------------------------------------------------------------------- Indications: Mitral regurgitation 424.0.  ------------------------------------------------------------------- History: Risk factors: Hypertension. Diabetes mellitus. Dyslipidemia.  ------------------------------------------------------------------- Study Conclusions  - Left ventricle: The cavity size was normal. Wall thickness was increased in a pattern of mild LVH. Systolic function was normal. The estimated ejection fraction was in the range of 55% to 60%. Wall motion was normal; there were no  regional wall motion abnormalities. - Aortic valve: There was no stenosis. There was trivial regurgitation. - Aorta: Grade 4 plaque in  the descending thoracic aorta and arch. - Mitral valve: The mitral valve was thickened towards the leaflet tips, morphology suggests rheumatic heart disease. There was no significant stenosis, mean gradient 4 mmHg. There was severe mitral regurgitation with ERO 0.42 cm^2. There was systolic flow reversal in 1 out of 2 pulmonary veins interrogated. - Left atrium: The atrium was mildly to moderately dilated. No evidence of thrombus in the atrial cavity or appendage. - Right ventricle: The cavity size was normal. Systolic function was normal. - Right atrium: No evidence of thrombus in the atrial cavity or appendage. - Atrial septum: There was a small PFO by color doppler but bubble study was negative. - Tricuspid valve: Trivial TR, peak RV-RA gradient 37 mmHg. 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  ------------------------------------------------------------------- Study data: Study status: Routine. Consent: The risks, benefits, and alternatives to the procedure were explained to the patient and informed consent was obtained. Procedure: The patient reported no pain pre or post test. Initial setup. The patient was brought to the laboratory. Surface ECG leads were monitored. Sedation. Moderate sedation was administered by cardiology staff. Transesophageal echocardiography. A transesophageal probe was inserted by the attending cardiologistwithout difficulty. Image quality was adequate. Intravenous contrast (agitated saline) was administered. Study completion: The patient tolerated the procedure well. There were no complications. Administered medications: Fentanyl, 52mcg, IV. Midazolam, 5mg , IV. Diagnostic transesophageal echocardiography.  2D and color Doppler. Birthdate: Patient birthdate: 1951-02-29. Age: Patient is 67 yr old. Sex: Gender: female. BMI: 20.2 kg/m^2. Blood pressure: 161/91 Patient status: Outpatient. Study date: Study date: 10/08/2017. Study time: 08:05 AM. Location: Endoscopy.  -------------------------------------------------------------------  ------------------------------------------------------------------- Left ventricle: The cavity size was normal. Wall thickness was increased in a pattern of mild LVH. Systolic function was normal. The estimated ejection fraction was in the range of 55% to 60%. Wall motion was normal; there were no regional wall motion abnormalities.  ------------------------------------------------------------------- Aortic valve: Trileaflet. Doppler: There was no stenosis. There was trivial regurgitation.  ------------------------------------------------------------------- Aorta: Grade 4 plaque in the descending thoracic aorta and arch.  ------------------------------------------------------------------- Mitral valve: The mitral valve was thickened towards the leaflet tips, morphology suggests rheumatic heart disease. There was no significant stenosis, mean gradient 4 mmHg. There was severe mitral regurgitation with ERO 0.42 cm^2. There was systolic flow reversal in 1 out of 2 pulmonary veins interrogated. Doppler: Mean gradient (D): 4 mm Hg.  ------------------------------------------------------------------- Left atrium: The atrium was mildly to moderately dilated. No evidence of thrombus in the atrial cavity or appendage.  ------------------------------------------------------------------- Atrial septum: There was a small PFO by color doppler but bubble study was negative.  ------------------------------------------------------------------- Right ventricle: The cavity size was normal. Systolic function  was normal.  ------------------------------------------------------------------- Pulmonic valve: Doppler: There was trivial regurgitation.  ------------------------------------------------------------------- Tricuspid valve: Trivial TR, peak RV-RA gradient 37 mmHg.  ------------------------------------------------------------------- Right atrium: The atrium was normal in size. No evidence of thrombus in the atrial cavity or appendage.  ------------------------------------------------------------------- Pericardium: There was no pericardial effusion.  ------------------------------------------------------------------- Post procedure conclusions Ascending Aorta:  - Grade 4 plaque in the descending thoracic aorta and arch.  ------------------------------------------------------------------- Measurements  Mitral valve Value Mitral mean velocity, D 99.3 cm/s Mitral mean gradient, D 4 mm Hg Mitral annulus VTI, D 43.7 cm Mitral regurg VTI, PISA 245 cm  Legend: (L) and (H) mark values outside specified reference range.  ------------------------------------------------------------------- Prepared and Electronically Authenticated by  [name], M.D. 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  She describes stable symptoms of exertional shortness of breath consistent with chronic diastolic congestive heart failure, New York Heart Association functional class III.  She is also having intermittent episodes of chest discomfort that are at least partly relieved by nitroglycerin and potentially consistent with stable angina pectoris.  Some of her symptoms are difficult to sort out because of her  underlying chronic problems with severe anxiety and depression.  Risks associated with conventional surgical mitral valve replacement and coronary artery bypass grafting would unquestionably be very high because of her numerous comorbid medical conditions including her chronic kidney disease which is rapidly approaching end-stage, severe anxiety and depression with emotional instability, and severe physical deconditioning.  I am concerned that she might do quite poorly with conventional surgery, although I agree that she clearly will not do well without some type of intervention in the near future.  She has been referred for establishment of dialysis access.  She has not yet undergone venogram nor has she been seen by a vascular surgeon for possible primary AV fistula creation.  I feel that if we proceed with surgery in the near future she will likely require initiation of hemodialysis postoperatively.  The high-grade ostial stenosis of the left circumflex coronary artery could potentially be treated with PCI and stenting, but this would be relatively high risk procedure given the involvement of the left main coronary artery.  In addition, the patient would be at fairly high risk for restenosis even following successful PCI.  Edge to edge MitraClip repair of the mitral valve could potentially be feasible but would run considerable risk of creating significant mitral stenosis.   Plan:  I have again discussed the complex nature of the patient's multiple clinical problems at length with the patient and her entire family in the office today.  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  We discussed the complicating presence of high-grade stenosis of left circumflex coronary artery and the associated risk of acute myocardial infarction.  We discussed the patient's underlying numerous comorbid medical problems which will make any type of intervention high  risk, most notably including her chronic kidney disease.  Expectations for her postoperative convalescence have been discussed including the high likelihood that she will need to initiate hemodialysis therapy.  After considerable discussion, the patient desires to proceed with high risk mitral valve replacement and coronary artery bypass grafting.  Ideally this could wait until the patient has established long-term dialysis access to minimize risk of bacteremia related to use of temporary dialysis catheters.  Unfortunately, the patient still does not have dialysis access and she continues to have significant symptoms of exertional shortness of breath and chest pain.  Given the presence of high-grade stenosis of left circumflex coronary artery, severe mitral regurgitation, and severe chronic kidney disease approaching the need for dialysis therapy, I feel that we probably have no choice but to proceed with surgery sooner rather than later.  We tentatively plan to proceed with high risk mitral valve replacement and coronary artery bypass grafting on December 25, 2017.  We may consider need for placement of temporary dialysis catheter at the time of surgery if the patient has not had dialysis access established previously.  We discussed the possibility of replacing the mitral valve using a mechanical prosthesis with the attendant need for long-term anticoagulation  versus the alternative of replacing it using a bioprosthetic tissue valve with its potential for late structural valve deterioration and failure, depending upon the patient's longevity.  The patient specifically requests that if the mitral valve must be replaced that it be done using a bioprosthetic tissue valve.   The patient and her family understand and accept all potential risks of surgery including but not limited to risk of death, stroke or other neurologic complication, myocardial infarction, congestive heart failure, respiratory failure, renal failure,  bleeding requiring transfusion and/or reexploration, arrhythmia, infection or other wound complications, pneumonia, pleural and/or pericardial effusion, pulmonary embolus, aortic dissection or other major vascular complication, or delayed complications related to valve repair or replacement including but not limited to structural valve deterioration and failure, thrombosis, embolization, endocarditis, or paravalvular leak.  All of their questions have been answered.  The patient has been instructed to stop taking Eliquis at least 7 days prior to surgery.  She will return to our office for follow-up prior to surgery on December 21, 2017.    I spent in excess of 30 minutes during the conduct of this office consultation and >50% of this time involved direct face-to-face encounter with the patient for counseling and/or coordination of their care.    Valentina Gu. Roxy Manns, MD 11/30/2017 12:46 PM

## 2017-12-01 ENCOUNTER — Other Ambulatory Visit: Payer: Self-pay

## 2017-12-15 ENCOUNTER — Telehealth: Payer: Self-pay

## 2017-12-15 NOTE — Telephone Encounter (Signed)
Ms. Jocelyn Hill called requesting other over-the-counter medications for a cold.  She stated that she has taken 2 boxes of Coricidin HBP with no relief.  I advised her to get into see her PCP as soon as possible to evaluate what is going on.  She has upcoming surgery scheduled for 12/25/2017.  Also, her daughter stated that she could go to an Urgent Care as well.  She stated that her PCP was out of town and the PA was "booked up".  I advised best thing in this case would be to go to an urgent care.  She acknowledged receipt.

## 2017-12-16 ENCOUNTER — Ambulatory Visit (INDEPENDENT_AMBULATORY_CARE_PROVIDER_SITE_OTHER)
Admission: RE | Admit: 2017-12-16 | Discharge: 2017-12-16 | Disposition: A | Discharge status: Home / Self Care | Payer: Medicare Other | Source: Ambulatory Visit | Attending: Vascular Surgery | Admitting: Vascular Surgery

## 2017-12-16 ENCOUNTER — Ambulatory Visit (HOSPITAL_COMMUNITY)
Admission: RE | Admit: 2017-12-16 | Discharge: 2017-12-16 | Disposition: A | Discharge status: Home / Self Care | Payer: Medicare Other | Source: Ambulatory Visit | Attending: Vascular Surgery | Admitting: Vascular Surgery

## 2017-12-16 DIAGNOSIS — Z01812 Encounter for preprocedural laboratory examination: Secondary | ICD-10-CM

## 2017-12-16 DIAGNOSIS — N185 Chronic kidney disease, stage 5: Secondary | ICD-10-CM

## 2017-12-17 ENCOUNTER — Ambulatory Visit: Payer: Medicare Other | Admitting: Vascular Surgery

## 2017-12-17 ENCOUNTER — Encounter

## 2017-12-17 ENCOUNTER — Encounter: Payer: Self-pay | Admitting: Vascular Surgery

## 2017-12-17 ENCOUNTER — Other Ambulatory Visit: Payer: Self-pay

## 2017-12-17 VITALS — BP 120/80 | HR 92 | Temp 98.8°F | Resp 16 | Ht 69.0 in | Wt 126.0 lb

## 2017-12-17 DIAGNOSIS — N185 Chronic kidney disease, stage 5: Secondary | ICD-10-CM | POA: Diagnosis not present

## 2017-12-17 NOTE — Progress Notes (Signed)
Referring Physician: Dr Hollie Salk  Patient name: Jocelyn Hill MRN: [id number] DOB: 1951-06-07 Sex: female  REASON FOR CONSULT: Dialysis access  HPI: Jocelyn Hill is a 67 y.o. female for evaluation for placement of a long-term hemodialysis access.  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  She is scheduled next week to have a mitral valve procedure as well as coronary bypass grafting.  This is being performed by Dr. Roxy Manns.  She is right-handed.  She is not currently on hemodialysis.  It is suspect that she will need dialysis after her heart operation.  She has some baseline numbness and tingling in both hands.  Other medical problems include diabetes, hyperlipidemia, hypertension coronary artery disease paroxysmal atrial fibrillation.  All of these are currently controlled.  She is symptomatic currently from her mitral valve disease with failure to thrive and shortness of breath.  Past Medical History:  Diagnosis Date  . Anxiety   . Arthritis   . Bronchitis   . CKD (chronic kidney disease) stage 4, GFR 15-29 ml/min (HCC) 03/16/2017  . Depression   . Diabetes mellitus without complication ([place])   . Diet-controlled diabetes mellitus ([place])   . GERD (gastroesophageal reflux disease)   . Heart failure ([place])   . Hyperlipidemia   . Hypertension   . Hypothyroidism   . Myocardial infarction ([place])    3x last one 2008  . PAF (paroxysmal atrial fibrillation) ([place])   . Stroke [facility])    Past Surgical History:  Procedure Laterality Date  . ABDOMINAL HYSTERECTOMY     PARTIALS  . CARDIAC CATHETERIZATION    . CESAREAN SECTION    . IR NEPHRO TUBE REMOV/FL  04/02/2017  . IR NEPHROSTOGRAM RIGHT THRU EXISTING ACCESS  04/02/2017  . IR NEPHROSTOMY PLACEMENT RIGHT  03/27/2017  . IR THORACENTESIS ASP PLEURAL SPACE W/IMG GUIDE  03/24/2017  . MULTIPLE EXTRACTIONS WITH ALVEOLOPLASTY N/A 11/05/2017   Procedure: Extraction of tooth #'s 4,5,7,8,9,10,12,14 and 29 with alveoloplasty and gross debridement of remaining teeth;   Surgeon: [name], DDS;  Location: [place];  Service: Oral Surgery;  Laterality: N/A;  . RIGHT/LEFT HEART CATH AND CORONARY ANGIOGRAPHY N/A 07/03/2017   Procedure: RIGHT/LEFT HEART CATH AND CORONARY ANGIOGRAPHY;  Surgeon: Larey Dresser, MD;  Location: [place] CV LAB;  Service: Cardiovascular;  Laterality: N/A;  . TEE WITHOUT CARDIOVERSION N/A 10/08/2017   Procedure: TRANSESOPHAGEAL ECHOCARDIOGRAM (TEE);  Surgeon: Larey Dresser, MD;  Location: [facility] ENDOSCOPY;  Service: Cardiovascular;  Laterality: N/A;  . TONSILLECTOMY    . TUBAL LIGATION      Family History  Problem Relation Age of Onset  . Hypertension Mother     SOCIAL HISTORY: Social History   Socioeconomic History  . Marital status: Divorced    Spouse name: Not on file  . Number of children: Not on file  . Years of education: Not on file  . Highest education level: Not on file  Occupational History  . Not on file  Social Needs  . Financial resource strain: Not on file  . Food insecurity:    Worry: Not on file    Inability: Not on file  . Transportation needs:    Medical: Not on file    Non-medical: Not on file  Tobacco Use  . Smoking status: Former Smoker    Types: Cigarettes  . Smokeless tobacco: Never Used  Substance and Sexual Activity  . Alcohol use: No  . Drug use: No  . Sexual activity: Never  Lifestyle  .  Physical activity:    Days per week: Not on file    Minutes per session: Not on file  . Stress: Not on file  Relationships  . Social connections:    Talks on phone: Not on file    Gets together: Not on file    Attends religious service: Not on file    Active member of club or organization: Not on file    Attends meetings of clubs or organizations: Not on file    Relationship status: Not on file  . Intimate partner violence:    Fear of current or ex partner: Not on file    Emotionally abused: Not on file    Physically abused: Not on file    Forced sexual activity: Not on file  Other  Topics Concern  . Not on file  Social History Narrative  . Not on file    Allergies  Allergen Reactions  . Ace Inhibitors Anaphylaxis and Swelling  . Motrin Ib [Ibuprofen] Anaphylaxis    Current Outpatient Medications  Medication Sig Dispense Refill  . acetaminophen (TYLENOL) 325 MG tablet Take 2 tablets (650 mg total) by mouth every 6 (six) hours as needed for mild pain, fever or headache. 30 tablet 0  . albuterol (PROVENTIL HFA;VENTOLIN HFA) 108 (90 Base) MCG/ACT inhaler Inhale 2 puffs into the lungs every 6 (six) hours as needed for wheezing or shortness of breath.    Marland Kitchen amLODipine (NORVASC) 10 MG tablet Take 1 tablet (10 mg total) by mouth daily. 30 tablet 0  . aspirin EC 81 MG EC tablet Take 1 tablet (81 mg total) by mouth daily. 30 tablet 0  . atorvastatin (LIPITOR) 40 MG tablet Take 1 tablet (40 mg total) by mouth daily. 30 tablet 0  . b complex vitamins tablet Take 1 tablet by mouth daily.    Marland Kitchen buPROPion (WELLBUTRIN) 100 MG tablet Take 100 mg by mouth 3 (three) times daily.    . busPIRone (BUSPAR) 15 MG tablet Take 1 tablet (15 mg total) by mouth 2 (two) times daily. 60 tablet 0  . carvedilol (COREG) 25 MG tablet Take 25 mg by mouth 2 (two) times daily with a meal.    . chlorhexidine (PERIDEX) 0.12 % solution Rinse with 15 mls twice daily for 30 seconds. Use after breakfast and at bedtime. Spit out excess. Do not swallow. (Patient taking differently: 15 mLs by Mouth Rinse route 2 (two) times daily as needed (for mouth discomfort). Rinse with 15 mls twice daily for 30 seconds. Use after breakfast and at bedtime. Spit out excess. Do not swallow.) 960 mL prn  . cholecalciferol (VITAMIN D) 1000 units tablet Take 1,000 Units by mouth daily.    Marland Kitchen doxazosin (CARDURA) 8 MG tablet Take 1 tablet (8 mg total) by mouth daily. 30 tablet 6  . DULoxetine (CYMBALTA) 30 MG capsule Take 1 capsule (30 mg total) by mouth daily. 30 capsule 0  . ELIQUIS 2.5 MG TABS tablet Take 2.5 mg by mouth 2 (two)  times daily.  10  . furosemide (LASIX) 80 MG tablet Take 1.5 tablets (120 mg total) by mouth 2 (two) times daily. 90 tablet 6  . gabapentin (NEURONTIN) 300 MG capsule Take 300 mg by mouth at bedtime.  11  . hydrALAZINE (APRESOLINE) 100 MG tablet Take 1 tablet (100 mg total) by mouth every 8 (eight) hours. (Patient taking differently: Take 100 mg by mouth 2 (two) times daily. ) 90 tablet 0  . isosorbide mononitrate (IMDUR) 120 MG 24  hr tablet Take 1 tablet (120 mg total) by mouth daily. 30 tablet 0  . levothyroxine (SYNTHROID, LEVOTHROID) 112 MCG tablet Take 1 tablet (112 mcg total) by mouth daily before breakfast. 30 tablet 0  . montelukast (SINGULAIR) 10 MG tablet Take 1 tablet (10 mg total) by mouth daily. (Patient taking differently: Take 10 mg by mouth at bedtime. ) 30 tablet 0  . nitroGLYCERIN (NITROSTAT) 0.4 MG SL tablet Place 1 tablet (0.4 mg total) under the tongue every 5 (five) minutes as needed for chest pain. 30 tablet 0  . Nutritional Supplements (CARNATION BREAKFAST ESSENTIALS PO) Take 237 mLs by mouth 2 (two) times daily.    . ondansetron (ZOFRAN) 4 MG tablet Take 1 tablet (4 mg total) by mouth every 8 (eight) hours as needed for nausea or vomiting. 30 tablet 0  . oxyCODONE (OXY IR/ROXICODONE) 5 MG immediate release tablet Take 1 tablet (5 mg total) by mouth every 8 (eight) hours as needed (pain). 6 tablet 0  . oxyCODONE-acetaminophen (PERCOCET) 5-325 MG tablet Take one or two tablets by mouth every 6 hours as needed for pain. 32 tablet 0  . pantoprazole (PROTONIX) 40 MG tablet Take 1 tablet (40 mg total) by mouth 2 (two) times daily before a meal. (Patient taking differently: Take 40 mg by mouth daily before breakfast. ) 60 tablet 0  . potassium chloride SA (K-DUR,KLOR-CON) 20 MEQ tablet Take 20 mEq by mouth daily.  3   No current facility-administered medications for this visit.     ROS:   General:  + weight loss, Fever, chills  HEENT: No recent headaches, no nasal bleeding, no  visual changes, no sore throat  Neurologic: No dizziness, blackouts, seizures. No recent symptoms of stroke or mini- stroke. No recent episodes of slurred speech, or temporary blindness.  Cardiac: No recent episodes of chest pain/pressure, no shortness of breath at rest.  + shortness of breath with exertion.  + history of atrial fibrillation or irregular heartbeat  Vascular: No history of rest pain in feet.  No history of claudication.  No history of non-healing ulcer, No history of DVT   Pulmonary: No home oxygen, no productive cough, no hemoptysis,  No asthma or wheezing  Musculoskeletal:  [ ]  Arthritis, [ ]  Low back pain,  [ ]  Joint pain  Hematologic:No history of hypercoagulable state.  No history of easy bleeding.  No history of anemia  Gastrointestinal: No hematochezia or melena,  No gastroesophageal reflux, no trouble swallowing  Urinary: [X]  chronic Kidney disease, [ ]  on HD - [ ]  MWF or [ ]  TTHS, [ ]  Burning with urination, [ ]  Frequent urination, [ ]  Difficulty urinating;   Skin: No rashes  Psychological: No history of anxiety,  No history of depression   Physical Examination  Vitals:   12/17/17 1313  BP: 120/80  Pulse: 92  Resp: 16  Temp: 98.8 F (37.1 C)  TempSrc: Oral  SpO2: 93%  Weight: 126 lb (57.2 kg)  Height: 5\' 9"  (1.753 m)    Body mass index is 18.61 kg/m.  General:  Alert and oriented, no acute distress HEENT: Poor dentition several recent tooth extractions Neck: No bruit or JVD Pulmonary: Clear to auscultation bilaterally Cardiac: Regularly irregular with murmur Abdomen: Soft, non-tender, non-distended, no mass Skin: No rash Extremity Pulses:  2+ radial, brachial pulses bilaterally Musculoskeletal: No deformity or edema  Neurologic: Upper and lower extremity motor 5/5 and symmetric  DATA:  Patient had a vein mapping ultrasound yesterday which showed the  cephalic vein is inadequate for fistula bilaterally.  The basilic vein was 3 mm  bilaterally.  Radial artery is small less than 3 mm bilaterally.  Brachial artery is of good quality.  I Reviewed and interpreted the study.  ASSESSMENT: CKD 5 with pending cardiac surgery.   PLAN: Patient would be a candidate for a left basilic vein transposition fistula.  However I would not plan on placing this prior to her cardiac surgery as she would be at very high risk.  I called Dr. Roxy Manns today regarding whether or not he wishes [place] to place a catheter at the time of her cardiac surgery.  I will forward my note to him today as well.  Our plan will be to place a dialysis catheter at some point during her hospital stay.  Fistula will be placed after she has recovered somewhat.  Risk benefits possible complications of procedure details were explained to the patient today include not limited to bleeding infection catheter malfunction ischemic steal from the hand after fistula placement non-maturation of the fistula bleeding infection.  She understands and agrees to proceed with whichever access plan   Ruta Hinds, MD [facility] Office: [phone] Pager: [phone]

## 2017-12-18 ENCOUNTER — Other Ambulatory Visit: Payer: Self-pay | Admitting: *Deleted

## 2017-12-18 ENCOUNTER — Encounter: Payer: Self-pay | Admitting: Nephrology

## 2017-12-18 NOTE — Pre-Procedure Instructions (Addendum)
Jocelyn Hill  12/18/2017    Your procedure is scheduled on Thursday, December 24, 2017 at 7:30 AM.   Report to [facility] Entrance "A" Admitting Office at 5:30 AM.   Call this number if you have problems the morning of surgery: [phone]   Questions prior to day of surgery, please call [phone] between 8 & 4 PM.   Remember:  Do not eat or drink after midnight Wednesday, 12/23/17  Take these medicines the morning of surgery with A SIP OF WATER: Amlodipine (Norvasc), Bupropion (Wellbutrin), Buspirone (Buspar), Carvedilol (Coreg), Doxazosin (Cardura), Duloxetine (Cymbalta), Hydralazine (Apresoline), Isosorbide Mononitrate (Imdur), Albuterol inhaler - if needed (bring with you day of surgery)    Do not wear jewelry, make-up or nail polish.  Do not wear lotions, powders, perfumes or deodorant.  Do not shave 48 hours prior to surgery.   Do not bring valuables to the hospital.  [facility] is not responsible for any belongings or valuables.  Contacts, dentures or bridgework may not be worn into surgery.  Leave your suitcase in the car.  After surgery it may be brought to your room.  For patients admitted to the hospital, discharge time will be determined by your treatment team.  [facility] - Preparing for Surgery  Before surgery, you can play an important role.  Because skin is not sterile, your skin needs to be as free of germs as possible.  You can reduce the number of germs on you skin by washing with CHG (chlorahexidine gluconate) soap before surgery.  CHG is an antiseptic cleaner which kills germs and bonds with the skin to continue killing germs even after washing.  Oral Hygiene is also important in reducing the risk of infection.  Remember to brush your teeth with your regular toothpaste the morning of surgery.  Please DO NOT use if you have an allergy to CHG or antibacterial soaps.  If your skin becomes reddened/irritated stop using the CHG and inform your nurse when you  arrive at Short Stay.  Do not shave (including legs and underarms) for at least 48 hours prior to the first CHG shower.  You may shave your face.  Please follow these instructions carefully:   1.  Shower with CHG Soap the night before surgery and the morning of Surgery.  2.  If you choose to wash your hair, wash your hair first as usual with your normal shampoo.  3.  After you shampoo, rinse your hair and body thoroughly to remove the shampoo. 4.  Use CHG as you would any other liquid soap.  You can apply chg directly to the skin and wash gently with a      scrungie or washcloth.           5.  Apply the CHG Soap to your body ONLY FROM THE NECK DOWN.   Do not use on open wounds or open sores. Avoid contact with your eyes, ears, mouth and genitals (private parts).  Wash genitals (private parts) with your normal soap.  6.  Wash thoroughly, paying special attention to the area where your surgery will be performed.  7.  Thoroughly rinse your body with warm water from the neck down.  8.  DO NOT shower/wash with your normal soap after using and rinsing off the CHG Soap.  9.  Pat yourself dry with a clean towel.            10.  Wear clean pajamas.  11.  Place clean sheets on your bed the night of your first shower and do not sleep with pets.  Day of Surgery  Shower as above. Do not apply any lotions/deodorants the morning of surgery.   Please wear clean clothes to the hospital. Remember to brush your teeth with toothpaste.   Please read over the fact sheets that you were given.

## 2017-12-21 ENCOUNTER — Ambulatory Visit (HOSPITAL_COMMUNITY)
Admission: RE | Admit: 2017-12-21 | Discharge: 2017-12-21 | Disposition: A | Discharge status: Home / Self Care | Payer: Medicare Other | Source: Ambulatory Visit | Attending: Thoracic Surgery (Cardiothoracic Vascular Surgery) | Admitting: Thoracic Surgery (Cardiothoracic Vascular Surgery)

## 2017-12-21 ENCOUNTER — Other Ambulatory Visit: Payer: Self-pay

## 2017-12-21 ENCOUNTER — Encounter: Payer: Self-pay | Admitting: Thoracic Surgery (Cardiothoracic Vascular Surgery)

## 2017-12-21 ENCOUNTER — Ambulatory Visit (INDEPENDENT_AMBULATORY_CARE_PROVIDER_SITE_OTHER): Payer: Medicare Other | Admitting: Thoracic Surgery (Cardiothoracic Vascular Surgery)

## 2017-12-21 ENCOUNTER — Encounter (HOSPITAL_COMMUNITY): Payer: Self-pay

## 2017-12-21 ENCOUNTER — Encounter (HOSPITAL_COMMUNITY)
Admission: RE | Admit: 2017-12-21 | Discharge: 2017-12-21 | Disposition: A | Discharge status: Home / Self Care | Payer: Medicare Other | Source: Ambulatory Visit | Attending: Thoracic Surgery (Cardiothoracic Vascular Surgery) | Admitting: Thoracic Surgery (Cardiothoracic Vascular Surgery)

## 2017-12-21 VITALS — BP 164/84 | HR 72 | Temp 97.0°F | Resp 18 | Ht 69.0 in | Wt 130.2 lb

## 2017-12-21 DIAGNOSIS — I340 Nonrheumatic mitral (valve) insufficiency: Secondary | ICD-10-CM

## 2017-12-21 DIAGNOSIS — I272 Pulmonary hypertension, unspecified: Secondary | ICD-10-CM

## 2017-12-21 DIAGNOSIS — I0981 Rheumatic heart failure: Secondary | ICD-10-CM | POA: Diagnosis not present

## 2017-12-21 DIAGNOSIS — I5032 Chronic diastolic (congestive) heart failure: Secondary | ICD-10-CM | POA: Diagnosis not present

## 2017-12-21 DIAGNOSIS — I480 Paroxysmal atrial fibrillation: Secondary | ICD-10-CM

## 2017-12-21 DIAGNOSIS — I2510 Atherosclerotic heart disease of native coronary artery without angina pectoris: Secondary | ICD-10-CM | POA: Diagnosis not present

## 2017-12-21 DIAGNOSIS — Z01810 Encounter for preprocedural cardiovascular examination: Secondary | ICD-10-CM | POA: Insufficient documentation

## 2017-12-21 DIAGNOSIS — I051 Rheumatic mitral insufficiency: Secondary | ICD-10-CM

## 2017-12-21 DIAGNOSIS — Z01818 Encounter for other preprocedural examination: Secondary | ICD-10-CM

## 2017-12-21 DIAGNOSIS — J9811 Atelectasis: Secondary | ICD-10-CM | POA: Insufficient documentation

## 2017-12-21 DIAGNOSIS — I517 Cardiomegaly: Secondary | ICD-10-CM | POA: Insufficient documentation

## 2017-12-21 DIAGNOSIS — R9431 Abnormal electrocardiogram [ECG] [EKG]: Secondary | ICD-10-CM | POA: Insufficient documentation

## 2017-12-21 DIAGNOSIS — J40 Bronchitis, not specified as acute or chronic: Secondary | ICD-10-CM

## 2017-12-21 DIAGNOSIS — M1380 Other specified arthritis, unspecified site: Secondary | ICD-10-CM | POA: Insufficient documentation

## 2017-12-21 DIAGNOSIS — I25119 Atherosclerotic heart disease of native coronary artery with unspecified angina pectoris: Secondary | ICD-10-CM

## 2017-12-21 HISTORY — DX: Pneumonia, unspecified organism: J18.9

## 2017-12-21 HISTORY — DX: Umbilical hernia without obstruction or gangrene: K42.9

## 2017-12-21 HISTORY — DX: Atherosclerotic heart disease of native coronary artery without angina pectoris: I25.10

## 2017-12-21 HISTORY — DX: Headache, unspecified: R51.9

## 2017-12-21 HISTORY — DX: Ganglion, unspecified site: M67.40

## 2017-12-21 HISTORY — DX: Anemia, unspecified: D64.9

## 2017-12-21 HISTORY — DX: Heart failure, unspecified: I50.9

## 2017-12-21 HISTORY — DX: Headache: R51

## 2017-12-21 LAB — TYPE AND SCREEN
ABO/RH(D): O POS
Antibody Screen: NEGATIVE

## 2017-12-21 LAB — COMPREHENSIVE METABOLIC PANEL
ALT: 17 U/L (ref 14–54)
AST: 19 U/L (ref 15–41)
Albumin: 3.8 g/dL (ref 3.5–5.0)
Alkaline Phosphatase: 101 U/L (ref 38–126)
Anion gap: 11 (ref 5–15)
BUN: 31 mg/dL — ABNORMAL HIGH (ref 6–20)
CO2: 23 mmol/L (ref 22–32)
Calcium: 9.4 mg/dL (ref 8.9–10.3)
Chloride: 103 mmol/L (ref 101–111)
Creatinine, Ser: 3.89 mg/dL — ABNORMAL HIGH (ref 0.44–1.00)
GFR calc Af Amer: 13 mL/min — ABNORMAL LOW (ref >60)
GFR calc non Af Amer: 11 mL/min — ABNORMAL LOW (ref >60)
Glucose, Bld: 155 mg/dL — ABNORMAL HIGH (ref 65–99)
Potassium: 4.2 mmol/L (ref 3.5–5.1)
Sodium: 137 mmol/L (ref 135–145)
Total Bilirubin: 0.6 mg/dL (ref 0.3–1.2)
Total Protein: 7 g/dL (ref 6.5–8.1)

## 2017-12-21 LAB — GLUCOSE, CAPILLARY: Glucose-Capillary: 131 mg/dL — ABNORMAL HIGH (ref 65–99)

## 2017-12-21 LAB — PULMONARY FUNCTION TEST
DL/VA % pred: 71 %
DL/VA: 3.82 ml/min/mmHg/L
DLCO cor % pred: 56 %
DLCO cor: 17.48 ml/min/mmHg
DLCO unc % pred: 53 %
DLCO unc: 16.62 ml/min/mmHg
FEF 25-75 Pre: 1.22 L/sec
FEF2575-%Pred-Pre: 55 %
FEV1-%Pred-Pre: 73 %
FEV1-Pre: 1.76 L
FEV1FVC-%Pred-Pre: 89 %
FEV6-%Pred-Pre: 84 %
FEV6-Pre: 2.5 L
FEV6FVC-%Pred-Pre: 103 %
FVC-%Pred-Pre: 81 %
FVC-Pre: 2.5 L
Pre FEV1/FVC ratio: 70 %
Pre FEV6/FVC Ratio: 100 %
RV % pred: 109 %
RV: 2.59 L
TLC % pred: 88 %
TLC: 5.16 L

## 2017-12-21 LAB — BLOOD GAS, ARTERIAL
Acid-Base Excess: 0.6 mmol/L (ref 0.0–2.0)
Bicarbonate: 24.3 mmol/L (ref 20.0–28.0)
Drawn by: 27052
FIO2: 21
O2 Saturation: 95.9 %
Patient temperature: 98.6
pCO2 arterial: 36.7 mmHg (ref 32.0–48.0)
pH, Arterial: 7.437 (ref 7.350–7.450)
pO2, Arterial: 75.8 mmHg — ABNORMAL LOW (ref 83.0–108.0)

## 2017-12-21 LAB — CBC
HCT: 37.4 % (ref 36.0–46.0)
Hemoglobin: 12.2 g/dL (ref 12.0–15.0)
MCH: 26.1 pg (ref 26.0–34.0)
MCHC: 32.6 g/dL (ref 30.0–36.0)
MCV: 79.9 fL (ref 78.0–100.0)
Platelets: 320 10*3/uL (ref 150–400)
RBC: 4.68 MIL/uL (ref 3.87–5.11)
RDW: 13.2 % (ref 11.5–15.5)
WBC: 9.2 10*3/uL (ref 4.0–10.5)

## 2017-12-21 LAB — PROTIME-INR
INR: 1
Prothrombin Time: 13.1 seconds (ref 11.4–15.2)

## 2017-12-21 LAB — SURGICAL PCR SCREEN
MRSA, PCR: NEGATIVE
Staphylococcus aureus: NEGATIVE

## 2017-12-21 LAB — HEMOGLOBIN A1C
Hgb A1c MFr Bld: 6.9 % — ABNORMAL HIGH (ref 4.8–5.6)
Mean Plasma Glucose: 151.33 mg/dL

## 2017-12-21 LAB — APTT: aPTT: 29 seconds (ref 24–36)

## 2017-12-21 MED ORDER — DOXYCYCLINE HYCLATE 100 MG PO TABS: 100.0000 mg | ORAL_TABLET | Freq: Two times a day (BID) | ORAL | 0 refills | Status: DC

## 2017-12-21 MED ORDER — ALBUTEROL SULFATE (2.5 MG/3ML) 0.083% IN NEBU
2.5000 mg | INHALATION_SOLUTION | Freq: Once | RESPIRATORY_TRACT | Status: AC
  Administered 2017-12-21: 2.5 mg via RESPIRATORY_TRACT

## 2017-12-21 NOTE — Patient Instructions (Addendum)
Resume taking Eliquis  Begin taking Doxycyline x 7 days  Go to your primary care physician if your symptoms don't resolve within a week

## 2017-12-21 NOTE — Progress Notes (Signed)
Pt has extensive cardiac history. Pt is a type 2 diabetic, diet controlled. States she does not check her blood sugar very often, has not done it for past several weeks. Pt has a very congested cough, denies fever. CXR done. Seeing Dr. Roxy Manns after this appt.

## 2017-12-21 NOTE — Progress Notes (Addendum)
[address]       [place] [address]             [phone]     CARDIOTHORACIC SURGERY OFFICE NOTE  Referring Provider is Jerline Pain, MD  Advanced Heart Failure Cardiologist is Larey Dresser, MD Primary Nephrologist is Madelon Lips, MD PCP is [name], MD  HPI:  Patient returns the office today with tentative plans to proceed with high risk mitral valve replacement, coronary artery bypass grafting, and Maze procedure later this week.  She was last seen here in our office on November 30, 2017 at which time we made tentative plans for surgery.  Since then she has been evaluated by Dr. Oneida Alar at [facility] for dialysis access needs, and we discussed the possibility of placing a tunneled dialysis catheter at the time of her upcoming surgery.  She returns to our office with 1 of her sons present.  She states that approximately 1-1/2 weeks ago she came down with a cold.  She has a persistent severe cough.  She has felt chilled and low-grade fevers.  Appetite is okay.  She states that her cough is productive of brown sputum.  She has not had high-grade fever.  She denies shortness of breath.   Current Outpatient Medications  Medication Sig Dispense Refill  . acetaminophen (TYLENOL) 325 MG tablet Take 2 tablets (650 mg total) by mouth every 6 (six) hours as needed for mild pain, fever or headache. 30 tablet 0  . albuterol (PROVENTIL HFA;VENTOLIN HFA) 108 (90 Base) MCG/ACT inhaler Inhale 2 puffs into the lungs every 6 (six) hours as needed for wheezing or shortness of breath.    Marland Kitchen amLODipine (NORVASC) 10 MG tablet Take 1 tablet (10 mg total) by mouth daily. 30 tablet 0  . aspirin EC 81 MG EC tablet Take 1 tablet (81 mg total) by mouth daily. 30 tablet 0  . atorvastatin (LIPITOR) 40 MG tablet Take 1 tablet (40 mg total) by mouth daily. 30 tablet 0  . b complex vitamins tablet Take 1 tablet by mouth daily.    Marland Kitchen buPROPion (WELLBUTRIN) 100 MG tablet Take 100 mg by mouth  3 (three) times daily.    . busPIRone (BUSPAR) 15 MG tablet Take 1 tablet (15 mg total) by mouth 2 (two) times daily. 60 tablet 0  . carvedilol (COREG) 25 MG tablet Take 25 mg by mouth 2 (two) times daily with a meal.    . chlorhexidine (PERIDEX) 0.12 % solution Rinse with 15 mls twice daily for 30 seconds. Use after breakfast and at bedtime. Spit out excess. Do not swallow. (Patient taking differently: 15 mLs by Mouth Rinse route 2 (two) times daily as needed (for mouth discomfort). Rinse with 15 mls twice daily for 30 seconds. Use after breakfast and at bedtime. Spit out excess. Do not swallow.) 960 mL prn  . cholecalciferol (VITAMIN D) 1000 units tablet Take 1,000 Units by mouth daily.    Marland Kitchen doxazosin (CARDURA) 8 MG tablet Take 1 tablet (8 mg total) by mouth daily. 30 tablet 6  . DULoxetine (CYMBALTA) 30 MG capsule Take 1 capsule (30 mg total) by mouth daily. 30 capsule 0  . ELIQUIS 2.5 MG TABS tablet Take 2.5 mg by mouth 2 (two) times daily.  10  . furosemide (LASIX) 80 MG tablet Take 1.5 tablets (120 mg total) by mouth 2 (two) times daily. 90 tablet 6  . gabapentin (NEURONTIN) 300 MG capsule Take 300  mg by mouth at bedtime.  11  . hydrALAZINE (APRESOLINE) 100 MG tablet Take 1 tablet (100 mg total) by mouth every 8 (eight) hours. (Patient taking differently: Take 100 mg by mouth 2 (two) times daily. ) 90 tablet 0  . isosorbide mononitrate (IMDUR) 120 MG 24 hr tablet Take 1 tablet (120 mg total) by mouth daily. 30 tablet 0  . levothyroxine (SYNTHROID, LEVOTHROID) 112 MCG tablet Take 1 tablet (112 mcg total) by mouth daily before breakfast. 30 tablet 0  . montelukast (SINGULAIR) 10 MG tablet Take 1 tablet (10 mg total) by mouth daily. (Patient taking differently: Take 10 mg by mouth at bedtime. ) 30 tablet 0  . nitroGLYCERIN (NITROSTAT) 0.4 MG SL tablet Place 1 tablet (0.4 mg total) under the tongue every 5 (five) minutes as needed for chest pain. 30 tablet 0  . Nutritional Supplements (CARNATION  BREAKFAST ESSENTIALS PO) Take 237 mLs by mouth 2 (two) times daily.    . ondansetron (ZOFRAN) 4 MG tablet Take 1 tablet (4 mg total) by mouth every 8 (eight) hours as needed for nausea or vomiting. 30 tablet 0  . oxyCODONE (OXY IR/ROXICODONE) 5 MG immediate release tablet Take 1 tablet (5 mg total) by mouth every 8 (eight) hours as needed (pain). 6 tablet 0  . oxyCODONE-acetaminophen (PERCOCET) 5-325 MG tablet Take one or two tablets by mouth every 6 hours as needed for pain. 32 tablet 0  . pantoprazole (PROTONIX) 40 MG tablet Take 1 tablet (40 mg total) by mouth 2 (two) times daily before a meal. (Patient taking differently: Take 40 mg by mouth daily before breakfast. ) 60 tablet 0  . potassium chloride SA (K-DUR,KLOR-CON) 20 MEQ tablet Take 20 mEq by mouth daily.  3   No current facility-administered medications for this visit.       Physical Exam:   BP (!) 164/84 (BP Location: Right Arm, Patient Position: Sitting, Cuff Size: Normal)   Pulse 72   Temp (!) 97 F (36.1 C)   Resp 18   Ht 5\' 9"  (1.753 m)   Wt 130 lb 3.2 oz (59.1 kg)   SpO2 98% Comment: RA  BMI 19.23 kg/m   General:  Chronically ill-appearing  Chest:   Scattered rhonchi both lung fields  CV:   Regular rate and rhythm with holosystolic murmur  Incisions:  n/a  Abdomen:  Soft nontender  Extremities:  Warm and well-perfused  Diagnostic Tests:  CXR performed today has been reviewed.  For unclear reasons an official report from radiology remains pending.   I do not appreciate an infiltrate to suggest pneumonia, nor do I see evidence of CHF   Impression:  Patient appears to have developed significant case of bronchitis.  Routine preoperative lab work today is notable for normal white count and the patient has not had significant fevers.  Chest x-ray does not reveal any infiltrate.  There is no sign of worsening congestive heart failure.  However, the patient clearly does not feel well and she has a persistent cough with  coarse rhonchi on physical exam.  Under the circumstances I feel that we have no choice but to temporarily postpone plans for surgery.    Plan:  I have given the patient a prescription for doxycycline to take for 7 days.  I have instructed her to seek care from her primary care physician if her symptoms do not improve and resolve within the next week.  We will have her return to our office in approximately 2  weeks and possibly reschedule surgery at that time.  She has been instructed to resume taking Eliquis.  All of her questions have been addressed.  I spent in excess of 15 minutes during the conduct of this office consultation and >50% of this time involved direct face-to-face encounter with the patient for counseling and/or coordination of their care.    Valentina Gu. Roxy Manns, MD 12/21/2017 5:01 PM    Addendum:  Official report from chest x-ray was notable for presence of new infiltrate in the right middle lobe, potentially consistent with pneumonia.  CHEST - 2 VIEW  COMPARISON:  10/19/2017.  FINDINGS: Cardiomegaly. Right middle lobe atelectasis/infiltrate. No pleural effusion or pneumothorax. Biapical pleural thickening consistent scarring. No acute bony abnormality.  IMPRESSION: 1.  Cardiomegaly.  2.  Right middle lobe atelectasis/infiltrate.   Electronically Signed   By: [place]   On: 12/22/2017 08:30    Valentina Gu. Roxy Manns, MD 12/22/2017 5:01 PM

## 2017-12-21 NOTE — Progress Notes (Addendum)
Pre-op Cardiac Surgery  Carotid Findings:  Bilateral - 1% to 39% ICA stenosis. Vertebral arery flow is antegrade  Upper Extremity Right Left  Brachial Pressures 160 Triphasic 153 Triphasic  Radial Waveforms Triphasic Triphasic  Ulnar Waveforms Triphasic Triphasic  Palmar Arch (Allen's Test) Normal Normal   Findings: Bilateral - Doppler waveforms remained normal with both radial and ulnar compressions.     [facility], [place]  12/21/2017 3:06 PM

## 2017-12-22 ENCOUNTER — Other Ambulatory Visit: Payer: Self-pay | Admitting: *Deleted

## 2017-12-25 ENCOUNTER — Inpatient Hospital Stay (HOSPITAL_COMMUNITY)
Admission: RE | Admit: 2017-12-25 | Payer: Medicare Other | Source: Ambulatory Visit | Admitting: Thoracic Surgery (Cardiothoracic Vascular Surgery)

## 2017-12-25 ENCOUNTER — Encounter (HOSPITAL_COMMUNITY): Admission: RE | Payer: Self-pay | Source: Ambulatory Visit

## 2017-12-25 SURGERY — REPLACEMENT, MITRAL VALVE
Anesthesia: General | Site: Chest

## 2017-12-28 ENCOUNTER — Encounter (HOSPITAL_COMMUNITY): Payer: Self-pay

## 2017-12-28 ENCOUNTER — Ambulatory Visit (HOSPITAL_COMMUNITY)
Admission: RE | Admit: 2017-12-28 | Discharge: 2017-12-28 | Disposition: A | Discharge status: Home / Self Care | Payer: Medicare Other | Source: Ambulatory Visit | Attending: Internal Medicine | Admitting: Internal Medicine

## 2017-12-28 VITALS — BP 170/98 | HR 84 | Wt 126.0 lb

## 2017-12-28 DIAGNOSIS — Z7901 Long term (current) use of anticoagulants: Secondary | ICD-10-CM | POA: Diagnosis not present

## 2017-12-28 DIAGNOSIS — Z7982 Long term (current) use of aspirin: Secondary | ICD-10-CM | POA: Diagnosis not present

## 2017-12-28 DIAGNOSIS — E1122 Type 2 diabetes mellitus with diabetic chronic kidney disease: Secondary | ICD-10-CM | POA: Insufficient documentation

## 2017-12-28 DIAGNOSIS — Z8249 Family history of ischemic heart disease and other diseases of the circulatory system: Secondary | ICD-10-CM | POA: Insufficient documentation

## 2017-12-28 DIAGNOSIS — I480 Paroxysmal atrial fibrillation: Secondary | ICD-10-CM | POA: Diagnosis not present

## 2017-12-28 DIAGNOSIS — F329 Major depressive disorder, single episode, unspecified: Secondary | ICD-10-CM | POA: Insufficient documentation

## 2017-12-28 DIAGNOSIS — I340 Nonrheumatic mitral (valve) insufficiency: Secondary | ICD-10-CM | POA: Insufficient documentation

## 2017-12-28 DIAGNOSIS — Z79899 Other long term (current) drug therapy: Secondary | ICD-10-CM | POA: Insufficient documentation

## 2017-12-28 DIAGNOSIS — I051 Rheumatic mitral insufficiency: Secondary | ICD-10-CM | POA: Diagnosis not present

## 2017-12-28 DIAGNOSIS — F419 Anxiety disorder, unspecified: Secondary | ICD-10-CM | POA: Insufficient documentation

## 2017-12-28 DIAGNOSIS — K219 Gastro-esophageal reflux disease without esophagitis: Secondary | ICD-10-CM | POA: Insufficient documentation

## 2017-12-28 DIAGNOSIS — Z8673 Personal history of transient ischemic attack (TIA), and cerebral infarction without residual deficits: Secondary | ICD-10-CM | POA: Insufficient documentation

## 2017-12-28 DIAGNOSIS — Z87891 Personal history of nicotine dependence: Secondary | ICD-10-CM | POA: Diagnosis not present

## 2017-12-28 DIAGNOSIS — I252 Old myocardial infarction: Secondary | ICD-10-CM | POA: Diagnosis not present

## 2017-12-28 DIAGNOSIS — Z79890 Hormone replacement therapy (postmenopausal): Secondary | ICD-10-CM | POA: Diagnosis not present

## 2017-12-28 DIAGNOSIS — I2510 Atherosclerotic heart disease of native coronary artery without angina pectoris: Secondary | ICD-10-CM | POA: Diagnosis not present

## 2017-12-28 DIAGNOSIS — I5032 Chronic diastolic (congestive) heart failure: Secondary | ICD-10-CM | POA: Diagnosis not present

## 2017-12-28 DIAGNOSIS — E785 Hyperlipidemia, unspecified: Secondary | ICD-10-CM | POA: Insufficient documentation

## 2017-12-28 DIAGNOSIS — N184 Chronic kidney disease, stage 4 (severe): Secondary | ICD-10-CM | POA: Diagnosis not present

## 2017-12-28 DIAGNOSIS — F410 Panic disorder [episodic paroxysmal anxiety] without agoraphobia: Secondary | ICD-10-CM | POA: Insufficient documentation

## 2017-12-28 DIAGNOSIS — Z886 Allergy status to analgesic agent status: Secondary | ICD-10-CM | POA: Diagnosis not present

## 2017-12-28 DIAGNOSIS — I130 Hypertensive heart and chronic kidney disease with heart failure and stage 1 through stage 4 chronic kidney disease, or unspecified chronic kidney disease: Secondary | ICD-10-CM | POA: Diagnosis not present

## 2017-12-28 MED ORDER — FUROSEMIDE 80 MG PO TABS: 80.0000 mg | ORAL_TABLET | Freq: Two times a day (BID) | ORAL | 3 refills | Status: DC

## 2017-12-28 MED ORDER — DOXAZOSIN MESYLATE 8 MG PO TABS: ORAL_TABLET | ORAL | 6 refills | Status: DC

## 2017-12-28 NOTE — Progress Notes (Signed)
Advanced Heart Failure Clinic Note   PCP: Dr. Dorita Fray ([place])  HF Cardiology: Dr. Aundra Dubin  HPI: Jocelyn [place]. Laurel is a 67 y.o. female with h/o mitral regurgitation, chronic diastolic CHF, CAD s/p MI, long standing HTN, DM2, CKD IV-V, h/o Stroke. Anxiety, depression and GERD. Patient reported that she has had 2 MIs treated at [facility] in [place] several years ago.   Presented to [facility] on 03/12/2017 with acute hypoxic respiratory failure. ECHO showed normal LV function with severe MR. Diuresed with IV lasix over 20 pounds. She was being considered for MVR by Dr Roxy Manns but due to multiple complications she was not a candidate. Hospital course was complicated by acute respiratory failure, acute renal failure, atrial fibrillation, retroperitoneal bleed, r hydronephrosis (perc tube) and E coli bacteremia. She was started on anticoagulation for atrial fibrillation but this was stopped with RP hematoma.  She converted to NSR on amiodarone but developed junctional rhythm so amiodarone was stopped. Creatinine peaked at 5.  She did not require dialysis and gradually renal function improved. Discharged to SNF without diuretics. Discharge weight was 124 pounds.   Admitted again 04/12/17-04/23/17 with respiratory failure from [facility]. She had gained 16 pounds at SNF as she was not on diuretic.  CXR with bilateral infiltrates. Placed on vanc and zosyn. Pertinent admission labs included BNP > 3000, creatinine 3.75, K 4.1, WBC 16.1, procalcitonin 2.06, and troponin 0.04. Requiring 100% high flow oxygen. Blood CX - NGTD. Diuresing with high dose IV lasix.  Nephrology consulted, renal US with no evidence of hydronephrosis (pt had percutaneous nephrostomy drain the month prior due to this). Also placed on NTG drip for HTN and CP. She was seen by Dr. Roxy Manns again and felt still to not be a MVR candidate. Discharged on Lasix 160 mg BID. Discharge weight was 114 pounds.   Today she returns for  HF follow up. Overall feeling fair. Complaining of dizziness when standing. Complaining of mouth gum pain. Poor appetite. Mild dyspnea with exertion. Denies PND/Orthopnea. No bleeding problems. No fever or chills. Weight at home down to 125 pounds. Taking all medications.   TTE 03/12/17 LVEF 60-65%, Grade 2 DD, Severe MR, Severe LAE, Mild RAE, Mild/Mod TR, PA peak pressure 60 mm hg, small/mod pericardial effusion.  TEE 03/17/17 LVEF 55-60%, Trivial TR, Severe MR, mild LAE, mild mod TR. No evidence of vegetation.  LHC/RHC in 1/19 showed 90% ostial LCx, 60% mid LAD.   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   Labs (10/18): K 3.8, creatinine 2.88 Labs (12/18): LDL 64, HDL 66 Labs (3/19): hgb 11.4 Labs (4/19): K 3.9, creatinine 3.97 Labs (12/21/2017): K 4.2 Creatinine 3.89   Review of Systems: All systems reviewed and negative except as per HPI.   PMH: 1. Chronic diastolic CHF:  - TTE 5/40/98 LVEF 60-65%, Grade 2 DD, Severe MR, Severe LAE, Mild RAE, Mild/Mod TR, PA peak pressure 60 mm hg, small/mod pericardial effusion. - RHC (1/19): mean RA 2, PA 37/12 mean 26, mean PCWP 13, CI 2.98 2. CKD: stage IV.  3. Anxiety/panic attacks 4. DM 5. HTN 6. CAD: Patient reports prior MI treated at [facility].  No records available - LHC in 1/19 showed 90% ostial LCx, 60% mid LAD.  7. Retroperitoneal hematoma: With anticoagulation in 9/18.  8. Hydronephrosis due to RP hematoma: Resolved.  9. GERD 10. Hyperlipidemia 11. H/o CVA 12. 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  - TEE 03/17/17 LVEF 55-60%, Trivial TR, Severe MR, mild LAE, mild mod TR. No evidence of vegetation. 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  13. Atrial fibrillation: Paroxysmal.  Noted in  9/18, not anticoagulated currently due to RP hematoma.  - Junctional rhythm on amiodarone, amiodarone stopped.   Current Outpatient Medications  Medication Sig Dispense Refill  . acetaminophen (TYLENOL) 325 MG tablet Take 2 tablets (650 mg total) by mouth every 6 (six) hours as needed for mild pain, fever or headache. 30 tablet 0  . albuterol (PROVENTIL HFA;VENTOLIN HFA) 108 (90 Base) MCG/ACT inhaler Inhale 2 puffs into the lungs every 6 (six) hours as needed for wheezing or shortness of breath.    Marland Kitchen amLODipine (NORVASC) 10 MG tablet Take 1 tablet (10 mg total) by mouth daily. 30 tablet 0  . aspirin EC 81 MG EC tablet Take 1 tablet (81 mg total) by mouth daily. 30 tablet 0  . atorvastatin (LIPITOR) 40 MG tablet Take 1 tablet (40 mg total) by mouth daily. 30 tablet 0  . b complex vitamins tablet Take 1 tablet by mouth daily.    Marland Kitchen buPROPion (WELLBUTRIN) 100 MG tablet Take 100 mg by mouth 3 (three) times daily.    . busPIRone (BUSPAR) 15 MG tablet Take 1 tablet (15 mg total) by mouth 2 (two) times daily. 60 tablet 0  . carvedilol (COREG) 25 MG tablet Take 25 mg by mouth 2 (two) times daily with a meal.    . chlorhexidine (PERIDEX) 0.12 % solution Rinse with 15 mls twice daily for 30 seconds. Use after breakfast and at bedtime. Spit out excess. Do not swallow. (Patient taking differently: 15 mLs by Mouth Rinse route 2 (two) times daily as needed (for mouth discomfort). Rinse with 15 mls twice daily for 30 seconds. Use after breakfast and at bedtime. Spit out excess. Do not swallow.) 960 mL prn  . cholecalciferol (VITAMIN D) 1000 units tablet Take 1,000 Units by mouth daily.    Marland Kitchen doxazosin (CARDURA) 8 MG tablet Take 1 tablet (8 mg total) by mouth daily. 30 tablet 6  . doxycycline (VIBRA-TABS) 100 MG tablet Take 1 tablet (100 mg total) by mouth 2 (two) times daily. 14 tablet 0  . DULoxetine (CYMBALTA) 30 MG capsule Take 1 capsule (30 mg total) by mouth daily. 30 capsule 0  . ELIQUIS 2.5 MG TABS tablet  Take 2.5 mg by mouth 2 (two) times daily.  10  . furosemide (LASIX) 80 MG tablet Take 1.5 tablets (120 mg total) by mouth 2 (two) times daily. 90 tablet 6  . gabapentin (NEURONTIN) 300 MG capsule Take 300 mg by mouth at bedtime.  11  . hydrALAZINE (APRESOLINE) 100 MG tablet Take 1 tablet (100 mg total) by mouth every 8 (eight) hours. (Patient taking differently: Take 100 mg by mouth 2 (two) times daily. ) 90 tablet 0  . isosorbide mononitrate (IMDUR) 120 MG 24 hr tablet Take 1 tablet (120 mg total) by mouth daily. 30 tablet 0  . levothyroxine (SYNTHROID, LEVOTHROID) 112 MCG tablet Take 1 tablet (112 mcg total) by mouth daily before breakfast. 30 tablet 0  . montelukast (SINGULAIR) 10 MG tablet Take 1 tablet (10 mg total) by mouth daily. (Patient taking differently: Take 10 mg by mouth at bedtime. ) 30 tablet 0  . nitroGLYCERIN (NITROSTAT) 0.4 MG SL tablet Place 1  tablet (0.4 mg total) under the tongue every 5 (five) minutes as needed for chest pain. 30 tablet 0  . Nutritional Supplements (CARNATION BREAKFAST ESSENTIALS PO) Take 237 mLs by mouth 2 (two) times daily.    . ondansetron (ZOFRAN) 4 MG tablet Take 1 tablet (4 mg total) by mouth every 8 (eight) hours as needed for nausea or vomiting. 30 tablet 0  . oxyCODONE (OXY IR/ROXICODONE) 5 MG immediate release tablet Take 1 tablet (5 mg total) by mouth every 8 (eight) hours as needed (pain). 6 tablet 0  . oxyCODONE-acetaminophen (PERCOCET) 5-325 MG tablet Take one or two tablets by mouth every 6 hours as needed for pain. 32 tablet 0  . pantoprazole (PROTONIX) 40 MG tablet Take 1 tablet (40 mg total) by mouth 2 (two) times daily before a meal. (Patient taking differently: Take 40 mg by mouth daily before breakfast. ) 60 tablet 0  . potassium chloride SA (K-DUR,KLOR-CON) 20 MEQ tablet Take 20 mEq by mouth daily.  3   No current facility-administered medications for this encounter.     Allergies  Allergen Reactions  . Ace Inhibitors Anaphylaxis and  Swelling  . Motrin Ib [Ibuprofen] Anaphylaxis      Social History   Socioeconomic History  . Marital status: Divorced    Spouse name: Not on file  . Number of children: Not on file  . Years of education: Not on file  . Highest education level: Not on file  Occupational History  . Not on file  Social Needs  . Financial resource strain: Not on file  . Food insecurity:    Worry: Not on file    Inability: Not on file  . Transportation needs:    Medical: Not on file    Non-medical: Not on file  Tobacco Use  . Smoking status: Former Smoker    Types: Cigarettes  . Smokeless tobacco: Never Used  Substance and Sexual Activity  . Alcohol use: No  . Drug use: No    Comment: marijuana in the past  . Sexual activity: Never  Lifestyle  . Physical activity:    Days per week: Not on file    Minutes per session: Not on file  . Stress: Not on file  Relationships  . Social connections:    Talks on phone: Not on file    Gets together: Not on file    Attends religious service: Not on file    Active member of club or organization: Not on file    Attends meetings of clubs or organizations: Not on file    Relationship status: Not on file  . Intimate partner violence:    Fear of current or ex partner: Not on file    Emotionally abused: Not on file    Physically abused: Not on file    Forced sexual activity: Not on file  Other Topics Concern  . Not on file  Social History Narrative  . Not on file      Family History  Problem Relation Age of Onset  . Hypertension Mother      Vitals:   12/28/17 1458  BP: (!) 170/98  Pulse: 84  SpO2: 98%  Weight: 126 lb (57.2 kg)   Wt Readings from Last 3 Encounters:  12/28/17 126 lb (57.2 kg)  12/21/17 130 lb 3.2 oz (59.1 kg)  12/17/17 126 lb (57.2 kg)   Vest Reading 20%. PHYSICAL EXAM: General:  Thin. Appears chronically ill. No resp difficulty HEENT: normal Neck: supple. no  JVD. Carotids 2+ bilat; no bruits. No lymphadenopathy or  thryomegaly appreciated. Cor: PMI nondisplaced. Regular rate & rhythm. No rubs, gallops 2/6 MR.  Lungs: RLL decreased Abdomen: soft, nontender, nondistended. No hepatosplenomegaly. No bruits or masses. Good bowel sounds. Extremities: no cyanosis, clubbing, rash, edema Neuro: alert & orientedx3, cranial nerves grossly intact. moves all 4 extremities w/o difficulty. Affect pleasant    ASSESSMENT & PLAN: 1. Chronic diastolic CHF: ECHO 0/1314 EF 60-65% with severe MR.  NYHA III. Volume status low. I am concerned she has not been eating  much with recent dental procedure.   Reds Vest 20%. Appears dry.  Hold lasix tonight and tomorrow. She will then take lasix 80 mg twice a day.  - Ultimately, needs mitral valve fixed to keep her out of CHF.  2. CKD Stage IV:  Had R hydronephrosis in setting of hydronephrosis and required percutaneous drain in 9/18 (removed). Renal US 04/14/17 showed no further evidence of hydronephrosis.  -Creatinine is now near 4.     - He has been followed by Dr Hollie Salk. She has had vein mapping. No plan for HD at this time.  3. HTN:  -Elevated     - Continue hydralazine 100 mg three times a day  - Continue amlodipine 10 mg daily.  - Continue coreg 25 mg BID.  - Continue doxazosin to 8 mg daily and add 4 mg in the evening.   4.  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  Confirmed on 4/19 TEE.  She needs mitral valve repair/replacement. -Valve surgery delayed with URI. She has follow up with Dr Clydene Laming in a few weeks.  - S/P multiple tooth extractions.  5. Atrial fibrillation: Paroxysmal.   - Regular pulse.  -She had a spontaneous RP hemorrhage in 9/18 but is now back on Eliquis.   - Continue Eliquis 2.5 mg bid (creatinine > 1.5, low weight).  - She is off amiodarone due to junctional bradycardia in the hospital.  6. CAD:  LHC in 1/19 showed severe ostial LCx disease and moderate LAD disease. She will need CABG with MVR.  No s/s ischemia.   - Continue ASA 81 and  atorvastatin 40 mg daily.  - She is on Imdur 120 mg daily and Coreg 25 mg bid.   Follow up with Dr Aundra Dubin in 3-4 weeks.  Greater than 50% of the 25 minute visit was spent in counseling/coordination of care regarding disease state education, salt/fluid restriction, sliding scale diuretics, and medication compliance.     Darrick Grinder, NP 12/28/17

## 2017-12-28 NOTE — Patient Instructions (Addendum)
HOLD Lasix tonight and tomorrow.  Then DECREASE dosage to 80 mg Twice Daily.    INCREASE Cardura to 8 mg (1 Tablet) in the AM and 4 mg (0.5 Tablet) in the PM.  Follow up as scheduled with Dr. Aundra Dubin.

## 2018-01-05 ENCOUNTER — Encounter: Payer: Self-pay | Admitting: Thoracic Surgery (Cardiothoracic Vascular Surgery)

## 2018-01-11 ENCOUNTER — Ambulatory Visit: Payer: Medicare Other | Admitting: Thoracic Surgery (Cardiothoracic Vascular Surgery)

## 2018-01-11 ENCOUNTER — Ambulatory Visit
Admission: RE | Admit: 2018-01-11 | Discharge: 2018-01-11 | Disposition: A | Discharge status: Home / Self Care | Payer: Medicare Other | Source: Ambulatory Visit | Attending: Thoracic Surgery (Cardiothoracic Vascular Surgery) | Admitting: Thoracic Surgery (Cardiothoracic Vascular Surgery)

## 2018-01-11 ENCOUNTER — Other Ambulatory Visit: Payer: Self-pay | Admitting: *Deleted

## 2018-01-11 ENCOUNTER — Encounter: Payer: Self-pay | Admitting: Thoracic Surgery (Cardiothoracic Vascular Surgery)

## 2018-01-11 VITALS — BP 122/70 | HR 63 | Resp 20 | Ht 69.0 in | Wt 130.0 lb

## 2018-01-11 DIAGNOSIS — I340 Nonrheumatic mitral (valve) insufficiency: Secondary | ICD-10-CM

## 2018-01-11 DIAGNOSIS — J9811 Atelectasis: Secondary | ICD-10-CM

## 2018-01-11 DIAGNOSIS — D649 Anemia, unspecified: Secondary | ICD-10-CM

## 2018-01-11 DIAGNOSIS — N184 Chronic kidney disease, stage 4 (severe): Secondary | ICD-10-CM

## 2018-01-11 DIAGNOSIS — I051 Rheumatic mitral insufficiency: Secondary | ICD-10-CM

## 2018-01-11 NOTE — Patient Instructions (Signed)
Continue all previous medications without any changes at this time  

## 2018-01-11 NOTE — Progress Notes (Signed)
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  She has a complicated medical history and was recently scheduled for elective mitral valve replacement, coronary artery bypass grafting, and possible Maze procedure on December 25, 2017.  However, when the patient was last seen here in the office on December 21, 2017 she was noted to have respiratory tract infection and chest x-ray confirmed infiltrate in the right middle lobe consistent with pneumonia.  Her surgery was postponed and she was treated as an outpatient with oral antibiotics.  She returns to the office today for follow-up.  She is accompanied by her daughter with whom she has been living for the last several months.  She states that she feels much better than she did when she was last seen in the office.  Her cough resolved completely.  Appetite is reasonably good.  She has been a little bit more active than previously.  She has not had problems with shortness of breath.  She has chronic pain in her back chest and both legs.  She denies any exertional chest discomfort suspicious for angina pectoris.  She denies any palpitations or dizzy spells.   She has had some mild lower extremity edema recently.    Current Outpatient Medications  Medication Sig Dispense Refill  . acetaminophen (TYLENOL) 325 MG tablet Take 2 tablets (650 mg total) by mouth every 6 (six) hours as needed for mild pain, fever or headache. 30 tablet 0  . albuterol (PROVENTIL HFA;VENTOLIN HFA) 108 (90 Base) MCG/ACT inhaler Inhale 2 puffs into the lungs every 6 (six) hours as needed for wheezing or shortness of breath.    Marland Kitchen amLODipine (NORVASC) 10 MG tablet Take 1 tablet (10 mg total) by mouth daily. 30 tablet 0  . aspirin EC 81 MG EC tablet Take 1 tablet (81 mg total) by mouth daily. 30 tablet 0  . atorvastatin (LIPITOR) 40 MG tablet Take 1 tablet (40 mg total) by mouth daily. 30 tablet 0  . b complex vitamins tablet Take 1 tablet by mouth daily.    Marland Kitchen buPROPion (WELLBUTRIN) 100 MG tablet Take 100 mg by mouth 3 (three) times daily.    . busPIRone (BUSPAR) 15 MG tablet Take 1 tablet (15 mg total) by mouth 2 (two) times daily. 60 tablet 0  . carvedilol (COREG) 25 MG tablet Take 25 mg by mouth 2 (two) times daily with a meal.    . chlorhexidine (PERIDEX) 0.12 % solution Rinse with 15 mls twice daily for 30 seconds. Use after breakfast and at bedtime. Spit out excess. Do not swallow. (Patient taking differently: 15 mLs by Mouth Rinse route 2 (two) times daily as needed (for mouth discomfort). Rinse with 15 mls twice daily for 30 seconds. Use after breakfast and at bedtime. Spit out excess. Do  not swallow.) 960 mL prn  . cholecalciferol (VITAMIN D) 1000 units tablet Take 1,000 Units by mouth daily.    Marland Kitchen doxazosin (CARDURA) 8 MG tablet Take 8 mg (1 Tablet) in the AM and 4 mg (0.5 Tablet) in the PM. 45 tablet 6  . DULoxetine (CYMBALTA) 30 MG capsule Take 1 capsule (30 mg total) by mouth daily. 30 capsule 0  . ELIQUIS 2.5 MG TABS tablet Take 2.5 mg by mouth 2 (two) times daily.  10  . furosemide (LASIX) 80 MG tablet Take 1 tablet (80 mg total) by mouth 2 (two) times daily. 180  tablet 3  . gabapentin (NEURONTIN) 300 MG capsule Take 300 mg by mouth at bedtime.  11  . hydrALAZINE (APRESOLINE) 100 MG tablet Take 1 tablet (100 mg total) by mouth every 8 (eight) hours. (Patient taking differently: Take 100 mg by mouth 2 (two) times daily. ) 90 tablet 0  . isosorbide mononitrate (IMDUR) 120 MG 24 hr tablet Take 1 tablet (120 mg total) by mouth daily. 30 tablet 0  . levothyroxine (SYNTHROID, LEVOTHROID) 112 MCG tablet Take 1 tablet (112 mcg total) by mouth daily before breakfast. 30 tablet 0  . montelukast (SINGULAIR) 10 MG tablet Take 1 tablet (10 mg total) by mouth daily. (Patient taking differently: Take 10 mg by mouth at bedtime. ) 30 tablet 0  . nitroGLYCERIN (NITROSTAT) 0.4 MG SL tablet Place 1 tablet (0.4 mg total) under the tongue every 5 (five) minutes as needed for chest pain. 30 tablet 0  . Nutritional Supplements (CARNATION BREAKFAST ESSENTIALS PO) Take 237 mLs by mouth 2 (two) times daily.    . ondansetron (ZOFRAN) 4 MG tablet Take 1 tablet (4 mg total) by mouth every 8 (eight) hours as needed for nausea or vomiting. 30 tablet 0  . oxyCODONE (OXY IR/ROXICODONE) 5 MG immediate release tablet Take 1 tablet (5 mg total) by mouth every 8 (eight) hours as needed (pain). 6 tablet 0  . oxyCODONE-acetaminophen (PERCOCET) 5-325 MG tablet Take one or two tablets by mouth every 6 hours as needed for pain. 32 tablet 0  . pantoprazole (PROTONIX) 40 MG tablet Take 1 tablet (40 mg total) by mouth 2 (two) times daily before a meal. (Patient taking differently: Take 40 mg by mouth daily before breakfast. ) 60 tablet 0  . potassium chloride SA (K-DUR,KLOR-CON) 20 MEQ tablet Take 20 mEq by mouth daily.  3   No current facility-administered medications for this visit.       Physical Exam:   BP 122/70   Pulse 63   Resp 20   Ht 5\' 9"  (1.753 m)   Wt 130 lb (59 kg)   SpO2 95% Comment: RA  BMI 19.20 kg/m   General:  Well-appearing  Chest:   Clear to auscultation  CV:   Regular  rate and rhythm with systolic murmur  Incisions:  n/a  Abdomen:  Soft nontender  Extremities:  Warm and well-perfused  Diagnostic Tests:  n/a   Impression:  The patient's recent case of community-acquired pneumonia appears to have resolved and clinically she looks quite stable at this time.  She has likely rheumatic heart disease with stage D severe symptomatic primary mitral regurgitation as well as multivessel coronary artery disease including high-grade ostial stenosis of the left circumflex coronary artery. She describes stable symptoms of exertional shortness of breath consistent with chronic diastolic congestive heart failure, New York Heart Association functional class III. In the past she has described intermittent episodes of chest  discomfort that are at least partly relieved by nitroglycerin and potentially consistent with stable angina pectoris, although she denies any significant episodes of chest discomfort recently. Some of her symptoms are difficult to sort out because of her underlying chronic problems with severe anxiety and depression.  All the risks associated with conventional surgery will be quite high, long-term prognosis without surgical intervention would be extremely poor.  I think it would be reasonable to proceed with surgery in the near future.  Plan:  We tentatively plan to proceed with elective mitral valve replacement using a bioprosthetic tissue valve, coronary artery bypass grafting, and possible Maze procedure on February 25, 2018.  We will recheck a follow-up chest x-ray today to make sure her right lung infiltrate has resolved.  We will also recheck blood work including follow-up creatinine.  We have not recommended any changes to patient's current medications at this time.  Her case has previously been discussed at length with Dr. Oneida Alar from [facility] regarding plans for establishing dialysis access.  As long as the patient's renal function remains stable at this time  we will not plan to place a dialysis catheter unless the patient develops need for initiation of hemodialysis during her early postoperative recovery.  We will consider whether or not to proceed with placement of a primary AV fistula while she is in the hospital depending how she does after surgery.  The patient will return to our office for follow-up prior to surgery on February 15, 2018.  She will call and return sooner should specific problems or questions arise.  I spent in excess of 15 minutes during the conduct of this office consultation and >50% of this time involved direct face-to-face encounter with the patient for counseling and/or coordination of their care.   Valentina Gu. Roxy Manns, MD 01/11/2018 10:59 AM

## 2018-01-11 NOTE — Progress Notes (Addendum)
Both images and report from chest x-ray performed this morning are reviewed.  Given the persistent of mild opacity in the right middle lobe we will plan CT scan without IV contrast when the patient returns for follow-up on February 15, 2018.   CHEST - 2 VIEW  COMPARISON:  12/21/2017 and older exams.  FINDINGS: Cardiac silhouette is mildly enlarged. No mediastinal or hilar masses. No evidence of adenopathy.  There is opacity in the right middle lobe which is similar to the most recent exam. This is likely atelectasis given the stability. It is new when compared to 10/19/2017.  Lungs are hyperexpanded but otherwise clear.  No pleural effusion or pneumothorax.  Skeletal structures are intact.  IMPRESSION: 1. Persistent right middle lobe opacity. Given the stability of this finding, atelectasis is suspected. Persistent pneumonia is not excluded. Recommend additional follow-up radiographs in 6-8 weeks to reassess. 2. No new abnormalities. 3. Mild stable cardiomegaly.   Electronically Signed   By: Lajean Manes M.D.   On: 01/11/2018 10:54      Rexene Alberts, MD 01/11/2018 11:15 AM

## 2018-01-12 ENCOUNTER — Other Ambulatory Visit: Payer: Self-pay

## 2018-01-12 ENCOUNTER — Encounter: Payer: Self-pay | Admitting: *Deleted

## 2018-01-12 ENCOUNTER — Other Ambulatory Visit: Payer: Self-pay | Admitting: *Deleted

## 2018-01-12 DIAGNOSIS — I25119 Atherosclerotic heart disease of native coronary artery with unspecified angina pectoris: Secondary | ICD-10-CM

## 2018-01-12 DIAGNOSIS — I340 Nonrheumatic mitral (valve) insufficiency: Secondary | ICD-10-CM

## 2018-01-12 LAB — CBC
HCT: 35.6 % (ref 35.0–45.0)
Hemoglobin: 11.7 g/dL (ref 11.7–15.5)
MCH: 26.8 pg — ABNORMAL LOW (ref 27.0–33.0)
MCHC: 32.9 g/dL (ref 32.0–36.0)
MCV: 81.5 fL (ref 80.0–100.0)
MPV: 10.6 fL (ref 7.5–12.5)
Platelets: 193 10*3/uL (ref 140–400)
RBC: 4.37 10*6/uL (ref 3.80–5.10)
RDW: 14.2 % (ref 11.0–15.0)
WBC: 6.6 10*3/uL (ref 3.8–10.8)

## 2018-01-12 LAB — COMPREHENSIVE METABOLIC PANEL
AG Ratio: 1.9 (calc) (ref 1.0–2.5)
ALT: 15 U/L (ref 6–29)
AST: 20 U/L (ref 10–35)
Albumin: 4.4 g/dL (ref 3.6–5.1)
Alkaline phosphatase (APISO): 94 U/L (ref 33–130)
BUN/Creatinine Ratio: 10 (calc) (ref 6–22)
BUN: 37 mg/dL — ABNORMAL HIGH (ref 7–25)
CO2: 27 mmol/L (ref 20–32)
Calcium: 9.8 mg/dL (ref 8.6–10.4)
Chloride: 101 mmol/L (ref 98–110)
Creat: 3.63 mg/dL — ABNORMAL HIGH (ref 0.50–0.99)
Globulin: 2.3 g/dL (calc) (ref 1.9–3.7)
Glucose, Bld: 85 mg/dL (ref 65–99)
Potassium: 4.2 mmol/L (ref 3.5–5.3)
Sodium: 138 mmol/L (ref 135–146)
Total Bilirubin: 0.7 mg/dL (ref 0.2–1.2)
Total Protein: 6.7 g/dL (ref 6.1–8.1)

## 2018-01-12 LAB — PREALBUMIN: Prealbumin: 42 mg/dL — ABNORMAL HIGH (ref 17–34)

## 2018-01-26 ENCOUNTER — Encounter (HOSPITAL_COMMUNITY): Payer: Self-pay | Admitting: Cardiology

## 2018-01-26 ENCOUNTER — Ambulatory Visit (HOSPITAL_COMMUNITY)
Admission: RE | Admit: 2018-01-26 | Discharge: 2018-01-26 | Disposition: A | Discharge status: Home / Self Care | Payer: Medicare Other | Source: Ambulatory Visit | Attending: Cardiology | Admitting: Cardiology

## 2018-01-26 VITALS — BP 130/82 | HR 67 | Wt 130.4 lb

## 2018-01-26 DIAGNOSIS — E1122 Type 2 diabetes mellitus with diabetic chronic kidney disease: Secondary | ICD-10-CM | POA: Diagnosis not present

## 2018-01-26 DIAGNOSIS — Z79891 Long term (current) use of opiate analgesic: Secondary | ICD-10-CM | POA: Insufficient documentation

## 2018-01-26 DIAGNOSIS — I2510 Atherosclerotic heart disease of native coronary artery without angina pectoris: Secondary | ICD-10-CM | POA: Insufficient documentation

## 2018-01-26 DIAGNOSIS — I5032 Chronic diastolic (congestive) heart failure: Secondary | ICD-10-CM | POA: Diagnosis not present

## 2018-01-26 DIAGNOSIS — I130 Hypertensive heart and chronic kidney disease with heart failure and stage 1 through stage 4 chronic kidney disease, or unspecified chronic kidney disease: Secondary | ICD-10-CM | POA: Diagnosis present

## 2018-01-26 DIAGNOSIS — I340 Nonrheumatic mitral (valve) insufficiency: Secondary | ICD-10-CM | POA: Insufficient documentation

## 2018-01-26 DIAGNOSIS — Z7982 Long term (current) use of aspirin: Secondary | ICD-10-CM | POA: Diagnosis not present

## 2018-01-26 DIAGNOSIS — I480 Paroxysmal atrial fibrillation: Secondary | ICD-10-CM

## 2018-01-26 DIAGNOSIS — Z7901 Long term (current) use of anticoagulants: Secondary | ICD-10-CM | POA: Insufficient documentation

## 2018-01-26 DIAGNOSIS — F419 Anxiety disorder, unspecified: Secondary | ICD-10-CM | POA: Diagnosis not present

## 2018-01-26 DIAGNOSIS — K219 Gastro-esophageal reflux disease without esophagitis: Secondary | ICD-10-CM | POA: Insufficient documentation

## 2018-01-26 DIAGNOSIS — E785 Hyperlipidemia, unspecified: Secondary | ICD-10-CM | POA: Diagnosis not present

## 2018-01-26 DIAGNOSIS — Z8249 Family history of ischemic heart disease and other diseases of the circulatory system: Secondary | ICD-10-CM | POA: Insufficient documentation

## 2018-01-26 DIAGNOSIS — F329 Major depressive disorder, single episode, unspecified: Secondary | ICD-10-CM | POA: Diagnosis not present

## 2018-01-26 DIAGNOSIS — Z79899 Other long term (current) drug therapy: Secondary | ICD-10-CM | POA: Insufficient documentation

## 2018-01-26 DIAGNOSIS — Z8673 Personal history of transient ischemic attack (TIA), and cerebral infarction without residual deficits: Secondary | ICD-10-CM | POA: Insufficient documentation

## 2018-01-26 DIAGNOSIS — N184 Chronic kidney disease, stage 4 (severe): Secondary | ICD-10-CM | POA: Insufficient documentation

## 2018-01-26 DIAGNOSIS — Z87891 Personal history of nicotine dependence: Secondary | ICD-10-CM | POA: Insufficient documentation

## 2018-01-26 LAB — BASIC METABOLIC PANEL
Anion gap: 8 (ref 5–15)
BUN: 38 mg/dL — ABNORMAL HIGH (ref 8–23)
CO2: 24 mmol/L (ref 22–32)
Calcium: 9.5 mg/dL (ref 8.9–10.3)
Chloride: 104 mmol/L (ref 98–111)
Creatinine, Ser: 4.02 mg/dL — ABNORMAL HIGH (ref 0.44–1.00)
GFR calc Af Amer: 12 mL/min — ABNORMAL LOW (ref >60)
GFR calc non Af Amer: 11 mL/min — ABNORMAL LOW (ref >60)
Glucose, Bld: 152 mg/dL — ABNORMAL HIGH (ref 70–99)
Potassium: 4 mmol/L (ref 3.5–5.1)
Sodium: 136 mmol/L (ref 135–145)

## 2018-01-26 MED ORDER — FUROSEMIDE 80 MG PO TABS: 120.0000 mg | ORAL_TABLET | Freq: Two times a day (BID) | ORAL | 3 refills | Status: DC

## 2018-01-26 MED ORDER — ALPRAZOLAM 0.25 MG PO TABS: 0.2500 mg | ORAL_TABLET | Freq: Every evening | ORAL | 0 refills | Status: DC | PRN

## 2018-01-26 NOTE — Patient Instructions (Addendum)
Routine lab work today. Will notify you of abnormal results, otherwise no news is good news!  Will refer you to [facility]. Their office will call in 1-2 weeks to schedule.  INCREASE Lasix to 120 mg (1.5 tabs) twice daily.  Routine lab work today. Will notify you of abnormal results, otherwise no news is good news!  Xanax 0.25 mg tablet as needed for anxiety.  Follow up 2 weeks.  ______________________________________________________________ Jocelyn Hill Code: 1500  Follow up 2 months with Dr. Aundra Dubin.  ______________________________________________________________ Jocelyn Hill Code: 4098  Take all medication as prescribed the day of your appointment. Bring all medications with you to your appointment.  Do the following things EVERYDAY: 1) Weigh yourself in the morning before breakfast. Write it down and keep it in a log. 2) Take your medicines as prescribed 3) Eat low salt foods-Limit salt (sodium) to 2000 mg per day.  4) Stay as active as you can everyday 5) Limit all fluids for the day to less than 2 liters

## 2018-01-27 ENCOUNTER — Telehealth (HOSPITAL_COMMUNITY): Payer: Self-pay | Admitting: *Deleted

## 2018-01-27 DIAGNOSIS — I5032 Chronic diastolic (congestive) heart failure: Secondary | ICD-10-CM

## 2018-01-27 NOTE — Progress Notes (Signed)
Advanced Heart Failure Clinic Note   PCP: Dr. Dorita Fray ([place]) HF Cardiology: Dr. Aundra Dubin  HPI: Jocelyn Sauer. Hill is a 67 y.o. female with h/o mitral regurgitation, chronic diastolic CHF, CAD s/p MI, long standing HTN, DM2, CKD IV-V, h/o Stroke. Anxiety, depression and GERD. Patient reported that she has had 2 MIs treated at [facility] in [place] several years ago.   Presented to [facility] on 03/12/2017 with acute hypoxic respiratory failure. ECHO showed normal LV function with severe MR. Diuresed with IV lasix over 20 pounds. She was being considered for MVR by Dr Roxy Manns but due to multiple complications she was not a candidate. Hospital course was complicated by acute respiratory failure, acute renal failure, atrial fibrillation, retroperitoneal bleed, r hydronephrosis (perc tube) and E coli bacteremia. She was started on anticoagulation for atrial fibrillation but this was stopped with RP hematoma.  She converted to NSR on amiodarone but developed junctional rhythm so amiodarone was stopped. Creatinine peaked at 5.  She did not require dialysis and gradually renal function improved. Discharged to SNF without diuretics. Discharge weight was 124 pounds.   Admitted again 04/12/17-04/23/17 with respiratory failure from [facility]. She had gained 16 pounds at SNF as she was not on diuretic.  CXR with bilateral infiltrates. Placed on vanc and zosyn. Pertinent admission labs included BNP > 3000, creatinine 3.75, K 4.1, WBC 16.1, procalcitonin 2.06, and troponin 0.04. Requiring 100% high flow oxygen. Blood CX - NGTD. Diuresing with high dose IV lasix.  Nephrology consulted, renal US with no evidence of hydronephrosis (pt had percutaneous nephrostomy drain the month prior due to this). Also placed on NTG drip for HTN and CP. She was seen by Dr. Roxy Manns again and felt still to not be a MVR candidate. Discharged on Lasix 160 mg BID. Discharge weight was 114 pounds.   TTE 03/12/17 LVEF 60-65%,  Grade 2 DD, Severe MR, Severe LAE, Mild RAE, Mild/Mod TR, PA peak pressure 60 mm hg, small/mod pericardial effusion.  TEE 03/17/17 LVEF 55-60%, Trivial TR, Severe MR, mild LAE, mild mod TR. No evidence of vegetation.  LHC/RHC in 1/19 showed 90% ostial LCx, 60% mid LAD.   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   She returns today for followup of CHF and CAD.  She is awaiting bioprosthetic MVR/CABG/Maze, this is planned for 02/25/18.  Lasix was decreased to 80 mg bid since last appointment.  She is more short of breath over the last several weeks.  Dyspnea walking 50 feet or going up an incline.  She has orthopnea => props up when sleeping.  She has occasional episodes of chest tightness still, not clearly exertional.  She is in NSR today. No palpitations.  She is very anxious about surgery.   ECG (personally reviewed): NSR, LVH   Labs (10/18): K 3.8, creatinine 2.88 Labs (12/18): LDL 64, HDL 66 Labs (3/19): hgb 11.4 Labs (4/19): K 3.9, creatinine 3.97 Labs (7/19): K 4.2, creatinine 3.6  Review of Systems: All systems reviewed and negative except as per HPI.   PMH: 1. Chronic diastolic CHF:  - TTE 7/89/38 LVEF 60-65%, Grade 2 DD, Severe MR, Severe LAE, Mild RAE, Mild/Mod TR, PA peak pressure 60 mm hg, small/mod pericardial effusion. - RHC (1/19): mean RA 2, PA 37/12 mean 26, mean PCWP 13, CI 2.98 2. CKD: stage IV.  3. Anxiety/panic attacks 4. DM 5. HTN 6. CAD: Patient reports prior MI  treated at [facility].  No records available - LHC in 1/19 showed 90% ostial LCx, 60% mid LAD.  7. Retroperitoneal hematoma: With anticoagulation in 9/18.  8. Hydronephrosis due to RP hematoma: Resolved.  9. GERD 10. Hyperlipidemia 11. H/o CVA 12. 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  - TEE 03/17/17 LVEF 55-60%, Trivial TR, Severe MR, mild LAE, mild mod TR. No evidence of  vegetation. 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  13. Atrial fibrillation: Paroxysmal.  Noted in 9/18, not anticoagulated currently due to RP hematoma.  - Junctional rhythm on amiodarone, amiodarone stopped.   Current Outpatient Medications  Medication Sig Dispense Refill  . acetaminophen (TYLENOL) 325 MG tablet Take 2 tablets (650 mg total) by mouth every 6 (six) hours as needed for mild pain, fever or headache. 30 tablet 0  . albuterol (PROVENTIL HFA;VENTOLIN HFA) 108 (90 Base) MCG/ACT inhaler Inhale 2 puffs into the lungs every 6 (six) hours as needed for wheezing or shortness of breath.    Marland Kitchen amLODipine (NORVASC) 10 MG tablet Take 1 tablet (10 mg total) by mouth daily. 30 tablet 0  . aspirin EC 81 MG EC tablet Take 1 tablet (81 mg total) by mouth daily. 30 tablet 0  . atorvastatin (LIPITOR) 40 MG tablet Take 1 tablet (40 mg total) by mouth daily. 30 tablet 0  . b complex vitamins tablet Take 1 tablet by mouth daily.    Marland Kitchen buPROPion (WELLBUTRIN) 100 MG tablet Take 100 mg by mouth 3 (three) times daily.    . busPIRone (BUSPAR) 15 MG tablet Take 1 tablet (15 mg total) by mouth 2 (two) times daily. 60 tablet 0  . carvedilol (COREG) 25 MG tablet Take 25 mg by mouth 2 (two) times daily with a meal.    . chlorhexidine (PERIDEX) 0.12 % solution Rinse with 15 mls twice daily for 30 seconds. Use after breakfast and at bedtime. Spit out excess. Do not swallow. (Patient taking differently: 15 mLs by Mouth Rinse route 2 (two) times daily as needed (for mouth discomfort). Rinse with 15 mls twice daily for 30 seconds. Use after breakfast and at bedtime. Spit out excess. Do not swallow.) 960 mL prn  . cholecalciferol (VITAMIN D) 1000 units tablet Take 1,000 Units by mouth daily.    Marland Kitchen doxazosin (CARDURA) 8 MG tablet Take 8 mg by mouth daily. Takes 1 tablet in the AM and 0.5  Tablet in the PM.    . DULoxetine (CYMBALTA) 30 MG capsule Take 1 capsule (30 mg total) by mouth daily. 30 capsule 0  . ELIQUIS 2.5 MG TABS tablet Take 2.5 mg by mouth 2 (two) times daily.  10  . furosemide (LASIX) 80 MG tablet Take 1.5 tablets (120 mg total) by mouth 2 (two) times daily. 270 tablet 3  . gabapentin (NEURONTIN) 300 MG capsule Take 300 mg by mouth at bedtime.  11  . hydrALAZINE (APRESOLINE) 100 MG tablet Take 100 mg by mouth 2 (two) times daily.    . isosorbide mononitrate (IMDUR) 120 MG 24 hr tablet Take 1 tablet (120 mg total) by mouth daily. 30 tablet 0  . levothyroxine (SYNTHROID, LEVOTHROID) 112 MCG tablet Take 1 tablet (112 mcg total) by mouth daily before breakfast. 30 tablet 0  . montelukast (SINGULAIR) 10 MG tablet Take 1 tablet (10 mg total) by mouth daily. (Patient taking differently: Take 10 mg  by mouth at bedtime. ) 30 tablet 0  . nitroGLYCERIN (NITROSTAT) 0.4 MG SL tablet Place 1 tablet (0.4 mg total) under the tongue every 5 (five) minutes as needed for chest pain. 30 tablet 0  . Nutritional Supplements (CARNATION BREAKFAST ESSENTIALS PO) Take 237 mLs by mouth 2 (two) times daily.    . ondansetron (ZOFRAN) 4 MG tablet Take 1 tablet (4 mg total) by mouth every 8 (eight) hours as needed for nausea or vomiting. 30 tablet 0  . oxyCODONE (OXY IR/ROXICODONE) 5 MG immediate release tablet Take 1 tablet (5 mg total) by mouth every 8 (eight) hours as needed (pain). 6 tablet 0  . oxyCODONE-acetaminophen (PERCOCET) 5-325 MG tablet Take one or two tablets by mouth every 6 hours as needed for pain. 32 tablet 0  . pantoprazole (PROTONIX) 40 MG tablet Take 1 tablet (40 mg total) by mouth 2 (two) times daily before a meal. (Patient taking differently: Take 40 mg by mouth daily before breakfast. ) 60 tablet 0  . potassium chloride SA (K-DUR,KLOR-CON) 20 MEQ tablet Take 20 mEq by mouth daily.  3  . ALPRAZolam (XANAX) 0.25 MG tablet Take 1 tablet (0.25 mg total) by mouth at bedtime as  needed for anxiety. 20 tablet 0   No current facility-administered medications for this encounter.     Allergies  Allergen Reactions  . Ace Inhibitors Anaphylaxis and Swelling  . Motrin Ib [Ibuprofen] Anaphylaxis      Social History   Socioeconomic History  . Marital status: Divorced    Spouse name: Not on file  . Number of children: Not on file  . Years of education: Not on file  . Highest education level: Not on file  Occupational History  . Not on file  Social Needs  . Financial resource strain: Not on file  . Food insecurity:    Worry: Not on file    Inability: Not on file  . Transportation needs:    Medical: Not on file    Non-medical: Not on file  Tobacco Use  . Smoking status: Former Smoker    Types: Cigarettes  . Smokeless tobacco: Never Used  Substance and Sexual Activity  . Alcohol use: No  . Drug use: No    Comment: marijuana in the past  . Sexual activity: Never  Lifestyle  . Physical activity:    Days per week: Not on file    Minutes per session: Not on file  . Stress: Not on file  Relationships  . Social connections:    Talks on phone: Not on file    Gets together: Not on file    Attends religious service: Not on file    Active member of club or organization: Not on file    Attends meetings of clubs or organizations: Not on file    Relationship status: Not on file  . Intimate partner violence:    Fear of current or ex partner: Not on file    Emotionally abused: Not on file    Physically abused: Not on file    Forced sexual activity: Not on file  Other Topics Concern  . Not on file  Social History Narrative  . Not on file      Family History  Problem Relation Age of Onset  . Hypertension Mother     Vitals:   01/26/18 1537  BP: 130/82  Pulse: 67  SpO2: 94%  Weight: 130 lb 6.4 oz (59.1 kg)     PHYSICAL EXAM: General:  NAD Neck: JVP difficult, no thyromegaly or thyroid nodule.  Lungs: Clear to auscultation bilaterally with  normal respiratory effort. CV: Nondisplaced PMI.  Heart regular S1/S2, no S3/S4, 3/6 HSM apex.  Trace ankle edema.  No carotid bruit.  Normal pedal pulses.  Abdomen: Soft, nontender, no hepatosplenomegaly, no distention.  Skin: Intact without lesions or rashes.  Neurologic: Alert and oriented x 3.  Psych: Normal affect. Extremities: No clubbing or cyanosis.  HEENT: Normal.   ASSESSMENT & PLAN: 1. Chronic diastolic CHF: ECHO 07/9560 EF 60-65% with severe MR. NYHA class IIIb symptoms, worse.  Currently taking Lasix 80 mg bid.  - Ultimately, needs mitral valve fixed to keep her out of CHF.  - Increase Lasix to 120 mg bid. BMET today and again in 10 days.  2. CKD Stage IV:  Had R hydronephrosis in setting of hydronephrosis and required percutaneous drain in 9/18 (removed). Renal US 04/14/17 showed no further evidence of hydronephrosis. Creatinine most recently 3.6.          - She will be going for surgery, may need HD peri-surgery.   3. HTN: BP controlled.     - Continue hydralazine 100 mg bid (can only remember to take bid).  - Continue amlodipine 10 mg daily.  - Continue coreg 25 mg BID.  - Continue doxazosin to 8 mg daily.    4.  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  Confirmed on 4/19 TEE.  Plan for bioprosthetic MVR 02/25/18.  5. Atrial fibrillation: Paroxysmal.  She is in NSR by exam today.  She had a spontaneous RP hemorrhage in 9/18 but is now back on Eliquis.   - Continue Eliquis 2.5 mg bid (creatinine > 1.5, low weight).  - She is off amiodarone due to junctional bradycardia in the hospital.  - Maze with MVR in 8/19.  6. CAD:  LHC in 1/19 showed severe ostial LCx disease and moderate LAD disease. She will need CABG with MVR in 8/19. Still with atypical chest pain, stable pattern. - Continue ASA 81 and atorvastatin 40 mg daily.  - She is on Imdur 120 mg daily and Coreg 25 mg bid.  7. Anxiety: I will give her low dose Xanax to use prn until her heart surgery.   Followup  with NP/PA in 2 wks, see me in 6 wks.  [name], MD 01/27/18

## 2018-01-27 NOTE — Telephone Encounter (Signed)
Result Notes for Basic metabolic panel   Notes recorded by Darron Doom, RN on 01/27/2018 at 10:28 AM EDT Spoke with patient and she agrees to come back next week. BMET ordered and appt scheduled. ------  Notes recorded by Larey Dresser, MD on 01/26/2018 at 9:40 PM EDT Creatinine is higher. We increased Lasix today. Will need to follow closely, repeat BMET next week.

## 2018-01-27 NOTE — Telephone Encounter (Signed)
[facility] referral faxed today to [phone]. Referral form placed in pending referrals folder.

## 2018-02-02 ENCOUNTER — Ambulatory Visit (HOSPITAL_COMMUNITY)
Admission: RE | Admit: 2018-02-02 | Discharge: 2018-02-02 | Disposition: A | Discharge status: Home / Self Care | Payer: Medicare Other | Source: Ambulatory Visit | Attending: Cardiology | Admitting: Cardiology

## 2018-02-02 DIAGNOSIS — I5032 Chronic diastolic (congestive) heart failure: Secondary | ICD-10-CM | POA: Insufficient documentation

## 2018-02-02 LAB — BASIC METABOLIC PANEL
Anion gap: 10 (ref 5–15)
BUN: 36 mg/dL — ABNORMAL HIGH (ref 8–23)
CO2: 26 mmol/L (ref 22–32)
Calcium: 9.3 mg/dL (ref 8.9–10.3)
Chloride: 104 mmol/L (ref 98–111)
Creatinine, Ser: 4.4 mg/dL — ABNORMAL HIGH (ref 0.44–1.00)
GFR calc Af Amer: 11 mL/min — ABNORMAL LOW (ref >60)
GFR calc non Af Amer: 10 mL/min — ABNORMAL LOW (ref >60)
Glucose, Bld: 171 mg/dL — ABNORMAL HIGH (ref 70–99)
Potassium: 3.1 mmol/L — ABNORMAL LOW (ref 3.5–5.1)
Sodium: 140 mmol/L (ref 135–145)

## 2018-02-03 ENCOUNTER — Telehealth (HOSPITAL_COMMUNITY): Payer: Self-pay | Admitting: *Deleted

## 2018-02-03 MED ORDER — POTASSIUM CHLORIDE CRYS ER 20 MEQ PO TBCR
40.0000 meq | EXTENDED_RELEASE_TABLET | Freq: Every day | ORAL | 3 refills | Status: DC
Start: 2018-02-03 — End: 2018-04-02

## 2018-02-03 NOTE — Telephone Encounter (Signed)
-----   Message from Larey Dresser, MD sent at 02/02/2018  4:38 PM EDT ----- Take 20 KCl daily.  Repeat BMET 1 week.

## 2018-02-09 ENCOUNTER — Inpatient Hospital Stay (HOSPITAL_COMMUNITY)
Admission: AD | Admit: 2018-02-09 | Discharge: 2018-03-05 | DRG: 219 | Disposition: A | Discharge status: Skilled Nursing Facility | Payer: Medicare Other | Source: Ambulatory Visit | Attending: Thoracic Surgery (Cardiothoracic Vascular Surgery) | Admitting: Thoracic Surgery (Cardiothoracic Vascular Surgery)

## 2018-02-09 ENCOUNTER — Ambulatory Visit (HOSPITAL_COMMUNITY)
Admission: RE | Admit: 2018-02-09 | Discharge: 2018-02-09 | Disposition: A | Discharge status: Home / Self Care | Payer: Medicare Other | Source: Ambulatory Visit | Attending: Internal Medicine | Admitting: Internal Medicine

## 2018-02-09 ENCOUNTER — Encounter (HOSPITAL_COMMUNITY): Payer: Self-pay

## 2018-02-09 VITALS — BP 112/62 | HR 52 | Wt 125.8 lb

## 2018-02-09 DIAGNOSIS — Y92239 Unspecified place in hospital as the place of occurrence of the external cause: Secondary | ICD-10-CM | POA: Diagnosis not present

## 2018-02-09 DIAGNOSIS — E039 Hypothyroidism, unspecified: Secondary | ICD-10-CM | POA: Diagnosis present

## 2018-02-09 DIAGNOSIS — R58 Hemorrhage, not elsewhere classified: Secondary | ICD-10-CM | POA: Diagnosis not present

## 2018-02-09 DIAGNOSIS — N189 Chronic kidney disease, unspecified: Secondary | ICD-10-CM | POA: Diagnosis present

## 2018-02-09 DIAGNOSIS — F329 Major depressive disorder, single episode, unspecified: Secondary | ICD-10-CM | POA: Diagnosis present

## 2018-02-09 DIAGNOSIS — R42 Dizziness and giddiness: Secondary | ICD-10-CM | POA: Diagnosis not present

## 2018-02-09 DIAGNOSIS — N1330 Unspecified hydronephrosis: Secondary | ICD-10-CM | POA: Diagnosis not present

## 2018-02-09 DIAGNOSIS — N184 Chronic kidney disease, stage 4 (severe): Secondary | ICD-10-CM | POA: Diagnosis not present

## 2018-02-09 DIAGNOSIS — E876 Hypokalemia: Secondary | ICD-10-CM | POA: Diagnosis not present

## 2018-02-09 DIAGNOSIS — E872 Acidosis: Secondary | ICD-10-CM | POA: Diagnosis present

## 2018-02-09 DIAGNOSIS — I255 Ischemic cardiomyopathy: Secondary | ICD-10-CM | POA: Diagnosis not present

## 2018-02-09 DIAGNOSIS — I051 Rheumatic mitral insufficiency: Secondary | ICD-10-CM

## 2018-02-09 DIAGNOSIS — I10 Essential (primary) hypertension: Secondary | ICD-10-CM | POA: Diagnosis not present

## 2018-02-09 DIAGNOSIS — I25110 Atherosclerotic heart disease of native coronary artery with unstable angina pectoris: Principal | ICD-10-CM | POA: Diagnosis present

## 2018-02-09 DIAGNOSIS — J690 Pneumonitis due to inhalation of food and vomit: Secondary | ICD-10-CM

## 2018-02-09 DIAGNOSIS — K219 Gastro-esophageal reflux disease without esophagitis: Secondary | ICD-10-CM | POA: Diagnosis present

## 2018-02-09 DIAGNOSIS — B9620 Unspecified Escherichia coli [E. coli] as the cause of diseases classified elsewhere: Secondary | ICD-10-CM | POA: Diagnosis not present

## 2018-02-09 DIAGNOSIS — R7881 Bacteremia: Secondary | ICD-10-CM | POA: Diagnosis not present

## 2018-02-09 DIAGNOSIS — Y838 Other surgical procedures as the cause of abnormal reaction of the patient, or of later complication, without mention of misadventure at the time of the procedure: Secondary | ICD-10-CM | POA: Diagnosis not present

## 2018-02-09 DIAGNOSIS — F32A Depression, unspecified: Secondary | ICD-10-CM | POA: Diagnosis present

## 2018-02-09 DIAGNOSIS — Z8679 Personal history of other diseases of the circulatory system: Secondary | ICD-10-CM | POA: Diagnosis not present

## 2018-02-09 DIAGNOSIS — F410 Panic disorder [episodic paroxysmal anxiety] without agoraphobia: Secondary | ICD-10-CM | POA: Diagnosis present

## 2018-02-09 DIAGNOSIS — Z79899 Other long term (current) drug therapy: Secondary | ICD-10-CM

## 2018-02-09 DIAGNOSIS — J939 Pneumothorax, unspecified: Secondary | ICD-10-CM

## 2018-02-09 DIAGNOSIS — Z9889 Other specified postprocedural states: Secondary | ICD-10-CM | POA: Diagnosis not present

## 2018-02-09 DIAGNOSIS — K661 Hemoperitoneum: Secondary | ICD-10-CM | POA: Diagnosis not present

## 2018-02-09 DIAGNOSIS — J9383 Other pneumothorax: Secondary | ICD-10-CM | POA: Diagnosis not present

## 2018-02-09 DIAGNOSIS — I361 Nonrheumatic tricuspid (valve) insufficiency: Secondary | ICD-10-CM | POA: Diagnosis not present

## 2018-02-09 DIAGNOSIS — I5043 Acute on chronic combined systolic (congestive) and diastolic (congestive) heart failure: Secondary | ICD-10-CM

## 2018-02-09 DIAGNOSIS — K5900 Constipation, unspecified: Secondary | ICD-10-CM | POA: Diagnosis not present

## 2018-02-09 DIAGNOSIS — D631 Anemia in chronic kidney disease: Secondary | ICD-10-CM | POA: Diagnosis present

## 2018-02-09 DIAGNOSIS — I5022 Chronic systolic (congestive) heart failure: Secondary | ICD-10-CM | POA: Diagnosis not present

## 2018-02-09 DIAGNOSIS — N2581 Secondary hyperparathyroidism of renal origin: Secondary | ICD-10-CM | POA: Diagnosis present

## 2018-02-09 DIAGNOSIS — D500 Iron deficiency anemia secondary to blood loss (chronic): Secondary | ICD-10-CM | POA: Diagnosis present

## 2018-02-09 DIAGNOSIS — I480 Paroxysmal atrial fibrillation: Secondary | ICD-10-CM | POA: Diagnosis present

## 2018-02-09 DIAGNOSIS — E871 Hypo-osmolality and hyponatremia: Secondary | ICD-10-CM | POA: Diagnosis not present

## 2018-02-09 DIAGNOSIS — I5042 Chronic combined systolic (congestive) and diastolic (congestive) heart failure: Secondary | ICD-10-CM | POA: Diagnosis present

## 2018-02-09 DIAGNOSIS — I82611 Acute embolism and thrombosis of superficial veins of right upper extremity: Secondary | ICD-10-CM | POA: Diagnosis not present

## 2018-02-09 DIAGNOSIS — I081 Rheumatic disorders of both mitral and tricuspid valves: Secondary | ICD-10-CM | POA: Diagnosis present

## 2018-02-09 DIAGNOSIS — N179 Acute kidney failure, unspecified: Secondary | ICD-10-CM | POA: Diagnosis not present

## 2018-02-09 DIAGNOSIS — I2510 Atherosclerotic heart disease of native coronary artery without angina pectoris: Secondary | ICD-10-CM

## 2018-02-09 DIAGNOSIS — F419 Anxiety disorder, unspecified: Secondary | ICD-10-CM | POA: Diagnosis present

## 2018-02-09 DIAGNOSIS — I200 Unstable angina: Secondary | ICD-10-CM

## 2018-02-09 DIAGNOSIS — N185 Chronic kidney disease, stage 5: Secondary | ICD-10-CM | POA: Diagnosis present

## 2018-02-09 DIAGNOSIS — R0902 Hypoxemia: Secondary | ICD-10-CM

## 2018-02-09 DIAGNOSIS — D509 Iron deficiency anemia, unspecified: Secondary | ICD-10-CM | POA: Diagnosis present

## 2018-02-09 DIAGNOSIS — Z79891 Long term (current) use of opiate analgesic: Secondary | ICD-10-CM

## 2018-02-09 DIAGNOSIS — D638 Anemia in other chronic diseases classified elsewhere: Secondary | ICD-10-CM

## 2018-02-09 DIAGNOSIS — I132 Hypertensive heart and chronic kidney disease with heart failure and with stage 5 chronic kidney disease, or end stage renal disease: Secondary | ICD-10-CM | POA: Diagnosis present

## 2018-02-09 DIAGNOSIS — J9601 Acute respiratory failure with hypoxia: Secondary | ICD-10-CM | POA: Diagnosis not present

## 2018-02-09 DIAGNOSIS — Z95818 Presence of other cardiac implants and grafts: Secondary | ICD-10-CM

## 2018-02-09 DIAGNOSIS — Z681 Body mass index (BMI) 19 or less, adult: Secondary | ICD-10-CM | POA: Diagnosis not present

## 2018-02-09 DIAGNOSIS — I5032 Chronic diastolic (congestive) heart failure: Secondary | ICD-10-CM | POA: Diagnosis not present

## 2018-02-09 DIAGNOSIS — E46 Unspecified protein-calorie malnutrition: Secondary | ICD-10-CM | POA: Diagnosis present

## 2018-02-09 DIAGNOSIS — E440 Moderate protein-calorie malnutrition: Secondary | ICD-10-CM | POA: Diagnosis present

## 2018-02-09 DIAGNOSIS — E118 Type 2 diabetes mellitus with unspecified complications: Secondary | ICD-10-CM

## 2018-02-09 DIAGNOSIS — Z886 Allergy status to analgesic agent status: Secondary | ICD-10-CM

## 2018-02-09 DIAGNOSIS — E1121 Type 2 diabetes mellitus with diabetic nephropathy: Secondary | ICD-10-CM | POA: Diagnosis present

## 2018-02-09 DIAGNOSIS — T502X5A Adverse effect of carbonic-anhydrase inhibitors, benzothiadiazides and other diuretics, initial encounter: Secondary | ICD-10-CM | POA: Diagnosis not present

## 2018-02-09 DIAGNOSIS — E1165 Type 2 diabetes mellitus with hyperglycemia: Secondary | ICD-10-CM | POA: Diagnosis present

## 2018-02-09 DIAGNOSIS — Z79890 Hormone replacement therapy (postmenopausal): Secondary | ICD-10-CM

## 2018-02-09 DIAGNOSIS — J95811 Postprocedural pneumothorax: Secondary | ICD-10-CM | POA: Diagnosis present

## 2018-02-09 DIAGNOSIS — Z951 Presence of aortocoronary bypass graft: Secondary | ICD-10-CM

## 2018-02-09 DIAGNOSIS — I272 Pulmonary hypertension, unspecified: Secondary | ICD-10-CM | POA: Diagnosis present

## 2018-02-09 DIAGNOSIS — M79609 Pain in unspecified limb: Secondary | ICD-10-CM | POA: Diagnosis not present

## 2018-02-09 DIAGNOSIS — E8889 Other specified metabolic disorders: Secondary | ICD-10-CM | POA: Diagnosis present

## 2018-02-09 DIAGNOSIS — E1151 Type 2 diabetes mellitus with diabetic peripheral angiopathy without gangrene: Secondary | ICD-10-CM | POA: Diagnosis present

## 2018-02-09 DIAGNOSIS — I481 Persistent atrial fibrillation: Secondary | ICD-10-CM | POA: Diagnosis not present

## 2018-02-09 DIAGNOSIS — I442 Atrioventricular block, complete: Secondary | ICD-10-CM | POA: Diagnosis not present

## 2018-02-09 DIAGNOSIS — Q211 Atrial septal defect: Secondary | ICD-10-CM | POA: Diagnosis not present

## 2018-02-09 DIAGNOSIS — Z952 Presence of prosthetic heart valve: Secondary | ICD-10-CM

## 2018-02-09 DIAGNOSIS — Z950 Presence of cardiac pacemaker: Secondary | ICD-10-CM

## 2018-02-09 DIAGNOSIS — I5033 Acute on chronic diastolic (congestive) heart failure: Secondary | ICD-10-CM | POA: Diagnosis present

## 2018-02-09 DIAGNOSIS — R200 Anesthesia of skin: Secondary | ICD-10-CM | POA: Diagnosis not present

## 2018-02-09 DIAGNOSIS — I440 Atrioventricular block, first degree: Secondary | ICD-10-CM | POA: Diagnosis not present

## 2018-02-09 DIAGNOSIS — R609 Edema, unspecified: Secondary | ICD-10-CM | POA: Diagnosis not present

## 2018-02-09 DIAGNOSIS — Z953 Presence of xenogenic heart valve: Secondary | ICD-10-CM

## 2018-02-09 DIAGNOSIS — I509 Heart failure, unspecified: Secondary | ICD-10-CM

## 2018-02-09 DIAGNOSIS — Z8673 Personal history of transient ischemic attack (TIA), and cerebral infarction without residual deficits: Secondary | ICD-10-CM

## 2018-02-09 DIAGNOSIS — I340 Nonrheumatic mitral (valve) insufficiency: Secondary | ICD-10-CM | POA: Diagnosis present

## 2018-02-09 DIAGNOSIS — I25119 Atherosclerotic heart disease of native coronary artery with unspecified angina pectoris: Secondary | ICD-10-CM | POA: Diagnosis present

## 2018-02-09 DIAGNOSIS — E1122 Type 2 diabetes mellitus with diabetic chronic kidney disease: Secondary | ICD-10-CM | POA: Diagnosis present

## 2018-02-09 DIAGNOSIS — I252 Old myocardial infarction: Secondary | ICD-10-CM

## 2018-02-09 DIAGNOSIS — Z7982 Long term (current) use of aspirin: Secondary | ICD-10-CM

## 2018-02-09 DIAGNOSIS — Z7901 Long term (current) use of anticoagulants: Secondary | ICD-10-CM

## 2018-02-09 DIAGNOSIS — J9811 Atelectasis: Secondary | ICD-10-CM | POA: Diagnosis not present

## 2018-02-09 DIAGNOSIS — D62 Acute posthemorrhagic anemia: Secondary | ICD-10-CM | POA: Diagnosis not present

## 2018-02-09 DIAGNOSIS — J189 Pneumonia, unspecified organism: Secondary | ICD-10-CM

## 2018-02-09 DIAGNOSIS — R079 Chest pain, unspecified: Secondary | ICD-10-CM | POA: Diagnosis not present

## 2018-02-09 DIAGNOSIS — M7989 Other specified soft tissue disorders: Secondary | ICD-10-CM | POA: Diagnosis not present

## 2018-02-09 DIAGNOSIS — Z8774 Personal history of (corrected) congenital malformations of heart and circulatory system: Secondary | ICD-10-CM

## 2018-02-09 DIAGNOSIS — I447 Left bundle-branch block, unspecified: Secondary | ICD-10-CM | POA: Diagnosis present

## 2018-02-09 DIAGNOSIS — E785 Hyperlipidemia, unspecified: Secondary | ICD-10-CM | POA: Diagnosis present

## 2018-02-09 DIAGNOSIS — D696 Thrombocytopenia, unspecified: Secondary | ICD-10-CM | POA: Diagnosis not present

## 2018-02-09 DIAGNOSIS — I349 Nonrheumatic mitral valve disorder, unspecified: Secondary | ICD-10-CM | POA: Diagnosis present

## 2018-02-09 DIAGNOSIS — I498 Other specified cardiac arrhythmias: Secondary | ICD-10-CM | POA: Diagnosis not present

## 2018-02-09 DIAGNOSIS — I4891 Unspecified atrial fibrillation: Secondary | ICD-10-CM | POA: Diagnosis not present

## 2018-02-09 HISTORY — DX: Other specified postprocedural states: Z98.890

## 2018-02-09 HISTORY — DX: Presence of xenogenic heart valve: Z95.3

## 2018-02-09 HISTORY — DX: Personal history of other diseases of the circulatory system: Z86.79

## 2018-02-09 HISTORY — DX: Nonrheumatic mitral (valve) insufficiency: I34.0

## 2018-02-09 LAB — BRAIN NATRIURETIC PEPTIDE: B Natriuretic Peptide: 1320.1 pg/mL — ABNORMAL HIGH (ref 0.0–100.0)

## 2018-02-09 LAB — PROTIME-INR
INR: 1.36
Prothrombin Time: 16.6 seconds — ABNORMAL HIGH (ref 11.4–15.2)

## 2018-02-09 LAB — BASIC METABOLIC PANEL
Anion gap: 10 (ref 5–15)
BUN: 36 mg/dL — ABNORMAL HIGH (ref 8–23)
CO2: 25 mmol/L (ref 22–32)
Calcium: 9.4 mg/dL (ref 8.9–10.3)
Chloride: 106 mmol/L (ref 98–111)
Creatinine, Ser: 4.48 mg/dL — ABNORMAL HIGH (ref 0.44–1.00)
GFR calc Af Amer: 11 mL/min — ABNORMAL LOW (ref >60)
GFR calc non Af Amer: 9 mL/min — ABNORMAL LOW (ref >60)
Glucose, Bld: 153 mg/dL — ABNORMAL HIGH (ref 70–99)
Potassium: 3.3 mmol/L — ABNORMAL LOW (ref 3.5–5.1)
Sodium: 141 mmol/L (ref 135–145)

## 2018-02-09 LAB — CBC
HCT: 32.1 % — ABNORMAL LOW (ref 36.0–46.0)
Hemoglobin: 10.7 g/dL — ABNORMAL LOW (ref 12.0–15.0)
MCH: 26.6 pg (ref 26.0–34.0)
MCHC: 33.3 g/dL (ref 30.0–36.0)
MCV: 79.9 fL (ref 78.0–100.0)
Platelets: 207 10*3/uL (ref 150–400)
RBC: 4.02 MIL/uL (ref 3.87–5.11)
RDW: 13.7 % (ref 11.5–15.5)
WBC: 5.8 10*3/uL (ref 4.0–10.5)

## 2018-02-09 LAB — GLUCOSE, CAPILLARY: Glucose-Capillary: 98 mg/dL (ref 70–99)

## 2018-02-09 LAB — MRSA PCR SCREENING: MRSA by PCR: NEGATIVE

## 2018-02-09 LAB — HEPARIN LEVEL (UNFRACTIONATED): Heparin Unfractionated: >2.2 IU/mL — ABNORMAL HIGH (ref 0.30–0.70)

## 2018-02-09 LAB — APTT: aPTT: 34 seconds (ref 24–36)

## 2018-02-09 LAB — TROPONIN I
Troponin I: <0.03 ng/mL (ref <0.03)
Troponin I: <0.03 ng/mL (ref <0.03)

## 2018-02-09 MED ORDER — INSULIN ASPART 100 UNIT/ML ~~LOC~~ SOLN
0.0000 [IU] | Freq: Three times a day (TID) | SUBCUTANEOUS | Status: DC
  Administered 2018-02-11 (×2): 1 [IU] via SUBCUTANEOUS
  Administered 2018-02-12: 2 [IU] via SUBCUTANEOUS
  Administered 2018-02-12 (×2): 1 [IU] via SUBCUTANEOUS
  Administered 2018-02-13: 2 [IU] via SUBCUTANEOUS
  Administered 2018-02-13 (×2): 1 [IU] via SUBCUTANEOUS
  Administered 2018-02-14: 2 [IU] via SUBCUTANEOUS
  Administered 2018-02-14 – 2018-02-16 (×4): 1 [IU] via SUBCUTANEOUS
  Administered 2018-02-16: 2 [IU] via SUBCUTANEOUS

## 2018-02-09 MED ORDER — NITROGLYCERIN IN D5W 200-5 MCG/ML-% IV SOLN
0.0000 ug/min | INTRAVENOUS | Status: DC
  Administered 2018-02-09: 25 ug/min via INTRAVENOUS
  Filled 2018-02-09 (×2): qty 250

## 2018-02-09 MED ORDER — BUPROPION HCL 100 MG PO TABS
100.0000 mg | ORAL_TABLET | Freq: Three times a day (TID) | ORAL | Status: DC
  Administered 2018-02-10 – 2018-02-17 (×24): 100 mg via ORAL
  Filled 2018-02-09 (×25): qty 1

## 2018-02-09 MED ORDER — MONTELUKAST SODIUM 10 MG PO TABS
10.0000 mg | ORAL_TABLET | Freq: Every day | ORAL | Status: DC
  Administered 2018-02-09 – 2018-02-17 (×9): 10 mg via ORAL
  Filled 2018-02-09 (×9): qty 1

## 2018-02-09 MED ORDER — NITROGLYCERIN IN D5W 200-5 MCG/ML-% IV SOLN
INTRAVENOUS | Status: AC
  Filled 2018-02-09: qty 250

## 2018-02-09 MED ORDER — GABAPENTIN 300 MG PO CAPS
300.0000 mg | ORAL_CAPSULE | Freq: Every day | ORAL | Status: DC
  Administered 2018-02-09 – 2018-02-17 (×9): 300 mg via ORAL
  Filled 2018-02-09 (×9): qty 1

## 2018-02-09 MED ORDER — B COMPLEX-C PO TABS
1.0000 | ORAL_TABLET | Freq: Every day | ORAL | Status: DC
  Administered 2018-02-10 – 2018-02-17 (×8): 1 via ORAL
  Filled 2018-02-09 (×9): qty 1

## 2018-02-09 MED ORDER — AMLODIPINE BESYLATE 10 MG PO TABS
10.0000 mg | ORAL_TABLET | Freq: Every day | ORAL | Status: DC
  Administered 2018-02-10 – 2018-02-17 (×8): 10 mg via ORAL
  Filled 2018-02-09 (×8): qty 1

## 2018-02-09 MED ORDER — DULOXETINE HCL 30 MG PO CPEP
30.0000 mg | ORAL_CAPSULE | Freq: Every day | ORAL | Status: DC
  Administered 2018-02-10 – 2018-02-17 (×8): 30 mg via ORAL
  Filled 2018-02-09 (×8): qty 1

## 2018-02-09 MED ORDER — PANTOPRAZOLE SODIUM 40 MG PO TBEC
40.0000 mg | DELAYED_RELEASE_TABLET | Freq: Every day | ORAL | Status: DC
  Administered 2018-02-10 – 2018-02-17 (×8): 40 mg via ORAL
  Filled 2018-02-09 (×8): qty 1

## 2018-02-09 MED ORDER — CARNATION BREAKFAST ESSENTIALS PO LIQD: Freq: Two times a day (BID) | ORAL | Status: DC

## 2018-02-09 MED ORDER — ALBUTEROL SULFATE (2.5 MG/3ML) 0.083% IN NEBU
2.5000 mg | INHALATION_SOLUTION | Freq: Four times a day (QID) | RESPIRATORY_TRACT | Status: DC | PRN
  Administered 2018-02-14: 2.5 mg via RESPIRATORY_TRACT
  Filled 2018-02-09: qty 3

## 2018-02-09 MED ORDER — HEPARIN (PORCINE) IN NACL 100-0.45 UNIT/ML-% IJ SOLN
1050.0000 [IU]/h | INTRAMUSCULAR | Status: DC
  Administered 2018-02-09: 800 [IU]/h via INTRAVENOUS
  Administered 2018-02-11: 1000 [IU]/h via INTRAVENOUS
  Administered 2018-02-12: 950 [IU]/h via INTRAVENOUS
  Administered 2018-02-13: 1050 [IU]/h via INTRAVENOUS
  Filled 2018-02-09 (×4): qty 250

## 2018-02-09 MED ORDER — ACETAMINOPHEN 325 MG PO TABS
650.0000 mg | ORAL_TABLET | Freq: Four times a day (QID) | ORAL | Status: DC | PRN
  Administered 2018-02-09 – 2018-02-10 (×3): 650 mg via ORAL
  Filled 2018-02-09 (×3): qty 2

## 2018-02-09 MED ORDER — ALPRAZOLAM 0.25 MG PO TABS
0.2500 mg | ORAL_TABLET | Freq: Every evening | ORAL | Status: DC | PRN
  Administered 2018-02-09 – 2018-02-12 (×3): 0.25 mg via ORAL
  Filled 2018-02-09 (×3): qty 1

## 2018-02-09 MED ORDER — OXYCODONE HCL 5 MG PO TABS
5.0000 mg | ORAL_TABLET | Freq: Three times a day (TID) | ORAL | Status: DC | PRN
  Administered 2018-02-09: 5 mg via ORAL
  Filled 2018-02-09: qty 1

## 2018-02-09 MED ORDER — SODIUM CHLORIDE 0.9% FLUSH
3.0000 mL | Freq: Two times a day (BID) | INTRAVENOUS | Status: DC
  Administered 2018-02-09 – 2018-02-15 (×7): 3 mL via INTRAVENOUS

## 2018-02-09 MED ORDER — POTASSIUM CHLORIDE CRYS ER 20 MEQ PO TBCR
40.0000 meq | EXTENDED_RELEASE_TABLET | Freq: Every day | ORAL | Status: DC
  Administered 2018-02-09 – 2018-02-14 (×6): 40 meq via ORAL
  Filled 2018-02-09 (×6): qty 2

## 2018-02-09 MED ORDER — FUROSEMIDE 80 MG PO TABS
120.0000 mg | ORAL_TABLET | Freq: Two times a day (BID) | ORAL | Status: DC
  Administered 2018-02-09 – 2018-02-16 (×15): 120 mg via ORAL
  Filled 2018-02-09 (×16): qty 1

## 2018-02-09 MED ORDER — LEVOTHYROXINE SODIUM 112 MCG PO TABS
112.0000 ug | ORAL_TABLET | Freq: Every day | ORAL | Status: DC
  Administered 2018-02-10: 112 ug via ORAL
  Filled 2018-02-09: qty 1

## 2018-02-09 MED ORDER — ATORVASTATIN CALCIUM 40 MG PO TABS
40.0000 mg | ORAL_TABLET | Freq: Every day | ORAL | Status: DC
  Administered 2018-02-10 – 2018-02-17 (×8): 40 mg via ORAL
  Filled 2018-02-09 (×8): qty 1

## 2018-02-09 MED ORDER — CARVEDILOL 25 MG PO TABS
25.0000 mg | ORAL_TABLET | Freq: Two times a day (BID) | ORAL | Status: DC
  Administered 2018-02-09 – 2018-02-12 (×7): 25 mg via ORAL
  Filled 2018-02-09 (×8): qty 1

## 2018-02-09 MED ORDER — BUSPIRONE HCL 15 MG PO TABS
15.0000 mg | ORAL_TABLET | Freq: Two times a day (BID) | ORAL | Status: DC
  Administered 2018-02-09 – 2018-02-17 (×17): 15 mg via ORAL
  Filled 2018-02-09 (×17): qty 1

## 2018-02-09 MED ORDER — SODIUM CHLORIDE 0.9% FLUSH: 3.0000 mL | INTRAVENOUS | Status: DC | PRN

## 2018-02-09 MED ORDER — VITAMIN D 1000 UNITS PO TABS
1000.0000 [IU] | ORAL_TABLET | Freq: Every day | ORAL | Status: DC
  Administered 2018-02-10: 1000 [IU] via ORAL
  Filled 2018-02-09: qty 1

## 2018-02-09 MED ORDER — RANOLAZINE ER 500 MG PO TB12
500.0000 mg | ORAL_TABLET | Freq: Two times a day (BID) | ORAL | Status: DC
  Administered 2018-02-09 – 2018-02-17 (×17): 500 mg via ORAL
  Filled 2018-02-09 (×17): qty 1

## 2018-02-09 MED ORDER — ONDANSETRON HCL 4 MG PO TABS
4.0000 mg | ORAL_TABLET | Freq: Three times a day (TID) | ORAL | Status: DC | PRN
  Administered 2018-02-13: 4 mg via ORAL
  Filled 2018-02-09: qty 1

## 2018-02-09 MED ORDER — ASPIRIN EC 81 MG PO TBEC
81.0000 mg | DELAYED_RELEASE_TABLET | Freq: Every day | ORAL | Status: DC
  Administered 2018-02-10 – 2018-02-17 (×8): 81 mg via ORAL
  Filled 2018-02-09 (×8): qty 1

## 2018-02-09 MED ORDER — POTASSIUM CHLORIDE CRYS ER 20 MEQ PO TBCR: 40.0000 meq | EXTENDED_RELEASE_TABLET | Freq: Every day | ORAL | Status: DC

## 2018-02-09 MED ORDER — ONDANSETRON HCL 4 MG/2ML IJ SOLN
4.0000 mg | Freq: Four times a day (QID) | INTRAMUSCULAR | Status: DC | PRN
  Administered 2018-02-11 – 2018-02-17 (×5): 4 mg via INTRAVENOUS
  Filled 2018-02-09 (×5): qty 2

## 2018-02-09 MED ORDER — SODIUM CHLORIDE 0.9 % IV SOLN
250.0000 mL | INTRAVENOUS | Status: DC | PRN
  Administered 2018-02-15: 250 mL via INTRAVENOUS
  Administered 2018-02-18: 07:00:00 via INTRAVENOUS

## 2018-02-09 NOTE — Progress Notes (Signed)
Pt needs CBG order and coverage (DM2) and K+ replacement (3.1). Paged MD.   [place]  Joshuwa Vecchio, RN

## 2018-02-09 NOTE — Progress Notes (Signed)
Advanced Heart Failure Clinic Note   PCP: Dr. Dorita Fray ([place]) HF Cardiology: Dr. Aundra Dubin  HPI: Jocelyn Hill. Jocelyn Hill is a 67 y.o. female with h/o mitral regurgitation, chronic diastolic CHF, CAD s/p MI, long standing HTN, DM2, CKD IV-V, h/o Stroke. Anxiety, depression and GERD. Patient reported that she has had 2 MIs treated at [facility] in [place] several years ago.   Presented to [facility] on 03/12/2017 with acute hypoxic respiratory failure. ECHO showed normal LV function with severe MR. Diuresed with IV lasix over 20 pounds. She was being considered for MVR by Dr Roxy Manns but due to multiple complications she was not a candidate. Hospital course was complicated by acute respiratory failure, acute renal failure, atrial fibrillation, retroperitoneal bleed, r hydronephrosis (perc tube) and E coli bacteremia. She was started on anticoagulation for atrial fibrillation but this was stopped with RP hematoma.  She converted to NSR on amiodarone but developed junctional rhythm so amiodarone was stopped. Creatinine peaked at 5.  She did not require dialysis and gradually renal function improved. Discharged to SNF without diuretics. Discharge weight was 124 pounds.   Admitted again 04/12/17-04/23/17 with respiratory failure from [facility]. She had gained 16 pounds at SNF as she was not on diuretic.  CXR with bilateral infiltrates. Placed on vanc and zosyn. Pertinent admission labs included BNP > 3000, creatinine 3.75, K 4.1, WBC 16.1, procalcitonin 2.06, and troponin 0.04. Requiring 100% high flow oxygen. Blood CX - NGTD. Diuresing with high dose IV lasix.  Nephrology consulted, renal US with no evidence of hydronephrosis (pt had percutaneous nephrostomy drain the month prior due to this). Also placed on NTG drip for HTN and CP. She was seen by Dr. Roxy Manns again and felt still to not be a MVR candidate. Discharged on Lasix 160 mg BID. Discharge weight was 114 pounds.   TTE 03/12/17 LVEF 60-65%,  Grade 2 DD, Severe MR, Severe LAE, Mild RAE, Mild/Mod TR, PA peak pressure 60 mm hg, small/mod pericardial effusion.  TEE 03/17/17 LVEF 55-60%, Trivial TR, Severe MR, mild LAE, mild mod TR. No evidence of vegetation.  LHC/RHC in 1/19 showed 90% ostial LCx, 60% mid LAD.   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   She returns today for HF follow up. Last visit lasix was increased. BMET showed worsening creatinine and she was referred to nephrology. She is awaiting bioprosthetic MVR/CABG/Maze, this is planned for 02/25/18. Overall doing poorly. Over the last week, she has been having more frequent chest pain, 1-2 times/day. She has associated SOB and radiation up bilat jaw and down right arm. Symptoms are resolved with 1-2 SL nitro. Sometimes she also takes reflux medication. Pain can occur at rest or with activity. SOB has improved overall, but she is still SOB with minimal activity, like walking 10-20 feet. No SOB with ADLs. She has improved UOP with clear/yellow urine on high dose lasix. Had some dizziness at rest yesterday. Denies edema. + orthopnea. Not weighing at home. Taking all medications.   Labs (10/18): K 3.8, creatinine 2.88 Labs (12/18): LDL 64, HDL 66 Labs (3/19): hgb 11.4 Labs (4/19): K 3.9, creatinine 3.97 Labs (7/19): K 4.2, creatinine 3.6  Review of systems complete and found to be negative unless listed in HPI.   PMH: 1. Chronic diastolic CHF:  - TTE 7/86/76 LVEF 60-65%, Grade 2 DD, Severe MR, Severe LAE, Mild RAE, Mild/Mod TR, PA peak pressure 60 mm hg,  small/mod pericardial effusion. - RHC (1/19): mean RA 2, PA 37/12 mean 26, mean PCWP 13, CI 2.98 2. CKD: stage IV.  3. Anxiety/panic attacks 4. DM 5. HTN 6. CAD: Patient reports prior MI treated at [facility].  No records available - LHC in 1/19 showed 90% ostial LCx, 60% mid LAD.  7. Retroperitoneal hematoma: With  anticoagulation in 9/18.  8. Hydronephrosis due to RP hematoma: Resolved.  9. GERD 10. Hyperlipidemia 11. H/o CVA 12. 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  - TEE 03/17/17 LVEF 55-60%, Trivial TR, Severe MR, mild LAE, mild mod TR. No evidence of vegetation. 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  13. Atrial fibrillation: Paroxysmal.  Noted in 9/18, not anticoagulated currently due to RP hematoma.  - Junctional rhythm on amiodarone, amiodarone stopped.   Current Outpatient Medications  Medication Sig Dispense Refill  . acetaminophen (TYLENOL) 325 MG tablet Take 2 tablets (650 mg total) by mouth every 6 (six) hours as needed for mild pain, fever or headache. 30 tablet 0  . albuterol (PROVENTIL HFA;VENTOLIN HFA) 108 (90 Base) MCG/ACT inhaler Inhale 2 puffs into the lungs every 6 (six) hours as needed for wheezing or shortness of breath.    . ALPRAZolam (XANAX) 0.25 MG tablet Take 1 tablet (0.25 mg total) by mouth at bedtime as needed for anxiety. 20 tablet 0  . amLODipine (NORVASC) 10 MG tablet Take 1 tablet (10 mg total) by mouth daily. 30 tablet 0  . aspirin EC 81 MG EC tablet Take 1 tablet (81 mg total) by mouth daily. 30 tablet 0  . atorvastatin (LIPITOR) 40 MG tablet Take 1 tablet (40 mg total) by mouth daily. 30 tablet 0  . b complex vitamins tablet Take 1 tablet by mouth daily.    Marland Kitchen buPROPion (WELLBUTRIN) 100 MG tablet Take 100 mg by mouth 3 (three) times daily.    . busPIRone (BUSPAR) 15 MG tablet Take 1 tablet (15 mg total) by mouth 2 (two) times daily. 60 tablet 0  . carvedilol (COREG) 25 MG tablet Take 25 mg by mouth 2 (two) times daily with a meal.    . chlorhexidine (PERIDEX) 0.12 % solution Rinse with 15 mls twice daily for 30 seconds. Use after breakfast and at bedtime. Spit out excess. Do not swallow. (Patient taking differently: 15 mLs  by Mouth Rinse route 2 (two) times daily as needed (for mouth discomfort). Rinse with 15 mls twice daily for 30 seconds. Use after breakfast and at bedtime. Spit out excess. Do not swallow.) 960 mL prn  . cholecalciferol (VITAMIN D) 1000 units tablet Take 1,000 Units by mouth daily.    Marland Kitchen doxazosin (CARDURA) 8 MG tablet Take 8 mg by mouth daily. Takes 1 tablet in the AM and 0.5 Tablet in the PM.    . DULoxetine (CYMBALTA) 30 MG capsule Take 1 capsule (30 mg total) by mouth daily. 30 capsule 0  . ELIQUIS 2.5 MG TABS tablet Take 2.5 mg by mouth 2 (two) times daily.  10  . furosemide (LASIX) 80 MG tablet Take 1.5 tablets (120 mg total) by mouth 2 (two) times daily. 270 tablet 3  . gabapentin (NEURONTIN) 300 MG capsule Take 300 mg by mouth at bedtime.  11  . hydrALAZINE (APRESOLINE) 100 MG tablet Take 100 mg by mouth 2 (two) times daily.    . isosorbide mononitrate (IMDUR) 120  MG 24 hr tablet Take 1 tablet (120 mg total) by mouth daily. 30 tablet 0  . levothyroxine (SYNTHROID, LEVOTHROID) 112 MCG tablet Take 1 tablet (112 mcg total) by mouth daily before breakfast. 30 tablet 0  . montelukast (SINGULAIR) 10 MG tablet Take 1 tablet (10 mg total) by mouth daily. (Patient taking differently: Take 10 mg by mouth at bedtime. ) 30 tablet 0  . nitroGLYCERIN (NITROSTAT) 0.4 MG SL tablet Place 1 tablet (0.4 mg total) under the tongue every 5 (five) minutes as needed for chest pain. 30 tablet 0  . Nutritional Supplements (CARNATION BREAKFAST ESSENTIALS PO) Take 237 mLs by mouth 2 (two) times daily.    . ondansetron (ZOFRAN) 4 MG tablet Take 1 tablet (4 mg total) by mouth every 8 (eight) hours as needed for nausea or vomiting. 30 tablet 0  . oxyCODONE (OXY IR/ROXICODONE) 5 MG immediate release tablet Take 1 tablet (5 mg total) by mouth every 8 (eight) hours as needed (pain). 6 tablet 0  . oxyCODONE-acetaminophen (PERCOCET) 5-325 MG tablet Take one or two tablets by mouth every 6 hours as needed for pain. 32 tablet 0    . pantoprazole (PROTONIX) 40 MG tablet Take 1 tablet (40 mg total) by mouth 2 (two) times daily before a meal. (Patient taking differently: Take 40 mg by mouth daily before breakfast. ) 60 tablet 0  . potassium chloride SA (K-DUR,KLOR-CON) 20 MEQ tablet Take 2 tablets (40 mEq total) by mouth daily. 60 tablet 3   No current facility-administered medications for this encounter.     Allergies  Allergen Reactions  . Ace Inhibitors Anaphylaxis and Swelling  . Motrin Ib [Ibuprofen] Anaphylaxis      Social History   Socioeconomic History  . Marital status: Divorced    Spouse name: Not on file  . Number of children: Not on file  . Years of education: Not on file  . Highest education level: Not on file  Occupational History  . Not on file  Social Needs  . Financial resource strain: Not on file  . Food insecurity:    Worry: Not on file    Inability: Not on file  . Transportation needs:    Medical: Not on file    Non-medical: Not on file  Tobacco Use  . Smoking status: Former Smoker    Types: Cigarettes  . Smokeless tobacco: Never Used  Substance and Sexual Activity  . Alcohol use: No  . Drug use: No    Comment: marijuana in the past  . Sexual activity: Never  Lifestyle  . Physical activity:    Days per week: Not on file    Minutes per session: Not on file  . Stress: Not on file  Relationships  . Social connections:    Talks on phone: Not on file    Gets together: Not on file    Attends religious service: Not on file    Active member of club or organization: Not on file    Attends meetings of clubs or organizations: Not on file    Relationship status: Not on file  . Intimate partner violence:    Fear of current or ex partner: Not on file    Emotionally abused: Not on file    Physically abused: Not on file    Forced sexual activity: Not on file  Other Topics Concern  . Not on file  Social History Narrative  . Not on file      Family History  Problem  Relation  Age of Onset  . Hypertension Mother     Vitals:   02/09/18 1506  BP: 112/62  Pulse: (!) 52  SpO2: 96%  Weight: 57.1 kg (125 lb 12.8 oz)   Wt Readings from Last 3 Encounters:  02/09/18 57.1 kg (125 lb 12.8 oz)  01/26/18 59.1 kg (130 lb 6.4 oz)  01/11/18 59 kg (130 lb)    PHYSICAL EXAM: General: No resp difficulty. HEENT: Normal Neck: Supple. JVP 6-7. Carotids 2+ bilat; no bruits. No thyromegaly or nodule noted. Cor: PMI nondisplaced. RRR, 3/6 HSM at apex Lungs: CTAB, normal effort. Abdomen: Soft, non-tender, non-distended, no HSM. No bruits or masses. +BS  Extremities: No cyanosis, clubbing, or rash. R and LLE no edema.  Neuro: Alert & orientedx3, cranial nerves grossly intact. moves all 4 extremities w/o difficulty. Affect pleasant   ASSESSMENT & PLAN: 1. CAD:  LHC in 1/19 showed severe ostial LCx disease and moderate LAD disease. She will need CABG with MVR in 8/19. Having more frequent CP with associated SOB over the last week, resolves with 1-2 SL nitro.  - Continue ASA 81 and atorvastatin 40 mg daily.  - Continue imdur 120 mg daily and Coreg 25 mg BID - EKG looks largely unchanged.  - Discussed with Dr Aundra Dubin. Will admit her for nitro drip. He will discuss with Dr Roxy Manns 2. Chronic diastolic CHF: ECHO 01/320 EF 60-65% with severe MR. NYHA class IIIb symptoms, worse. Volume stable.  - Ultimately, needs mitral valve fixed to keep her out of CHF.  - Continue Lasix 120 mg bid. 3. CKD Stage IV:  Had R hydronephrosis in setting of hydronephrosis and required percutaneous drain in 9/18 (removed). Renal US 04/14/17 showed no further evidence of hydronephrosis.  - Creatinine most recently 4.4. Referred to nephrology last visit. BMET today.        - She will be going for surgery, may need HD peri-surgery.   4. HTN: BP controlled today - Continue hydralazine 100 mg bid (can only remember to take bid).  - Continue amlodipine 10 mg daily.  - Continue coreg 25 mg BID.  - Continue  doxazosin 8 mg daily.    5.  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  Confirmed on 4/19 TEE.  Plan for bioprosthetic MVR 02/25/18.  6. Atrial fibrillation: Paroxysmal.  She is in NSR by exam today.  She had a spontaneous RP hemorrhage in 9/18 but is now back on Eliquis.   - Continue Eliquis 2.5 mg bid (creatinine > 1.5, low weight). Denies bleeding - She is off amiodarone due to junctional bradycardia in the hospital.  - Maze with MVR in 8/19. No change.  7. Anxiety: has PRN xanax  With more frequent CP responsive to SL nitro and known CAD, will admit for nitro and heparin drip. Dicussed with Dr Aundra Dubin.  Georgiana Shore, NP 02/09/18

## 2018-02-09 NOTE — H&P (Addendum)
Advanced Heart Failure H&P  PCP: Dr. Dorita Fray ([place]) HF Cardiology: Dr. Aundra Dubin  HPI: Jocelyn Hill is a 67 y.o. female with h/o mitral regurgitation, chronic diastolic CHF, CAD s/p MI, long standing HTN, DM2, CKD IV-V, h/o Stroke. Anxiety, depression and GERD. Patient reported that she has had 2 MIs treated at [facility] in [place] several years ago.   Presented to [facility] on 03/12/2017 with acute hypoxic respiratory failure. ECHO showed normal LV function with severe MR. Diuresed with IV lasix over 20 pounds. She was being considered for MVR by Dr Roxy Manns but due to multiple complications she was not a candidate. Hospital course was complicated by acute respiratory failure, acute renal failure, atrial fibrillation, retroperitoneal bleed, r hydronephrosis (perc tube) and E coli bacteremia. She was started on anticoagulation for atrial fibrillation but this was stopped with RP hematoma.  She converted to NSR on amiodarone but developed junctional rhythm so amiodarone was stopped. Creatinine peaked at 5.  She did not require dialysis and gradually renal function improved. Discharged to SNF without diuretics. Discharge weight was 124 pounds.   Admitted again 04/12/17-04/23/17 with respiratory failure from [facility]. She had gained 16 pounds at SNF as she was not on diuretic.  CXR with bilateral infiltrates. Placed on vanc and zosyn. Pertinent admission labs included BNP > 3000, creatinine 3.75, K 4.1, WBC 16.1, procalcitonin 2.06, and troponin 0.04. Requiring 100% high flow oxygen. Blood CX - NGTD. Diuresing with high dose IV lasix.  Nephrology consulted, renal US with no evidence of hydronephrosis (pt had percutaneous nephrostomy drain the month prior due to this). Also placed on NTG drip for HTN and CP. She was seen by Dr. Roxy Manns again and felt still to not be a MVR candidate. Discharged on Lasix 160 mg BID. Discharge weight was 114 pounds.   TTE 03/12/17 LVEF 60-65%, Grade 2  DD, Severe MR, Severe LAE, Mild RAE, Mild/Mod TR, PA peak pressure 60 mm hg, small/mod pericardial effusion.   TEE 03/17/17 LVEF 55-60%, Trivial TR, Severe MR, mild LAE, mild mod TR. No evidence of vegetation.  LHC/RHC in 1/19 showed 90% ostial LCx, 60% mid LAD.   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   She returns today for HF follow up. Last visit lasix was increased. BMET showed worsening creatinine and she was referred to nephrology. She is awaiting bioprosthetic MVR/CABG/Maze, this is planned for 02/25/18. Overall doing poorly. Over the last week, she has been having more frequent chest pain, 1-2 times/day. She has associated SOB and radiation up bilat jaw and down right arm. Symptoms are resolved with 1-2 SL nitro. Sometimes she also takes reflux medication. Pain can occur at rest or with activity. SOB has improved overall, but she is still SOB with minimal activity, like walking 10-20 feet. No SOB with ADLs. She has improved UOP with clear/yellow urine on high dose lasix. Had some dizziness at rest yesterday. Denies edema. + orthopnea. Not weighing at home. Taking all medications.   Labs (10/18): K 3.8, creatinine 2.88 Labs (12/18): LDL 64, HDL 66 Labs (3/19): hgb 11.4 Labs (4/19): K 3.9, creatinine 3.97 Labs (7/19): K 4.2, creatinine 3.6  Review of systems complete and found to be negative unless listed in HPI.   PMH: 1. Chronic diastolic CHF:  - TTE 4/40/34 LVEF 60-65%, Grade 2 DD, Severe MR, Severe LAE, Mild RAE, Mild/Mod TR, PA peak pressure 60 mm hg, small/mod  pericardial effusion. - RHC (1/19): mean RA 2, PA 37/12 mean 26, mean PCWP 13, CI 2.98 2. CKD: stage IV.  3. Anxiety/panic attacks 4. DM 5. HTN 6. CAD: Patient reports prior MI treated at [facility].  No records available - LHC in 1/19 showed 90% ostial LCx, 60% mid LAD.  7. Retroperitoneal hematoma: With  anticoagulation in 9/18.  8. Hydronephrosis due to RP hematoma: Resolved.  9. GERD 10. Hyperlipidemia 11. H/o CVA 12. 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  - TEE 03/17/17 LVEF 55-60%, Trivial TR, Severe MR, mild LAE, mild mod TR. No evidence of vegetation. 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  13. Atrial fibrillation: Paroxysmal.  Noted in 9/18, not anticoagulated currently due to RP hematoma.  - Junctional rhythm on amiodarone, amiodarone stopped.   No current facility-administered medications for this encounter.    Current Outpatient Medications  Medication Sig Dispense Refill  . acetaminophen (TYLENOL) 325 MG tablet Take 2 tablets (650 mg total) by mouth every 6 (six) hours as needed for mild pain, fever or headache. 30 tablet 0  . albuterol (PROVENTIL HFA;VENTOLIN HFA) 108 (90 Base) MCG/ACT inhaler Inhale 2 puffs into the lungs every 6 (six) hours as needed for wheezing or shortness of breath.    . ALPRAZolam (XANAX) 0.25 MG tablet Take 1 tablet (0.25 mg total) by mouth at bedtime as needed for anxiety. 20 tablet 0  . amLODipine (NORVASC) 10 MG tablet Take 1 tablet (10 mg total) by mouth daily. 30 tablet 0  . aspirin EC 81 MG EC tablet Take 1 tablet (81 mg total) by mouth daily. 30 tablet 0  . atorvastatin (LIPITOR) 40 MG tablet Take 1 tablet (40 mg total) by mouth daily. 30 tablet 0  . b complex vitamins tablet Take 1 tablet by mouth daily.    Marland Kitchen buPROPion (WELLBUTRIN) 100 MG tablet Take 100 mg by mouth 3 (three) times daily.    . busPIRone (BUSPAR) 15 MG tablet Take 1 tablet (15 mg total) by mouth 2 (two) times daily. 60 tablet 0  . carvedilol (COREG) 25 MG tablet Take 25 mg by mouth 2 (two) times daily with a meal.    . chlorhexidine (PERIDEX) 0.12 % solution Rinse with 15 mls twice daily for 30 seconds. Use after breakfast and at  bedtime. Spit out excess. Do not swallow. (Patient taking differently: 15 mLs by Mouth Rinse route 2 (two) times daily as needed (for mouth discomfort). Rinse with 15 mls twice daily for 30 seconds. Use after breakfast and at bedtime. Spit out excess. Do not swallow.) 960 mL prn  . cholecalciferol (VITAMIN D) 1000 units tablet Take 1,000 Units by mouth daily.    Marland Kitchen doxazosin (CARDURA) 8 MG tablet Take 8 mg by mouth daily. Takes 1 tablet in the AM and 0.5 Tablet in the PM.    . DULoxetine (CYMBALTA) 30 MG capsule Take 1 capsule (30 mg total) by mouth daily. 30 capsule 0  . ELIQUIS 2.5 MG TABS tablet Take 2.5 mg by mouth 2 (two) times daily.  10  . furosemide (LASIX) 80 MG tablet Take 1.5 tablets (120 mg total) by mouth 2 (two) times daily. 270 tablet 3  . gabapentin (NEURONTIN) 300 MG capsule Take 300 mg by mouth at bedtime.  11  . hydrALAZINE (APRESOLINE) 100 MG tablet Take 100 mg by mouth 2 (two) times  daily.    . isosorbide mononitrate (IMDUR) 120 MG 24 hr tablet Take 1 tablet (120 mg total) by mouth daily. 30 tablet 0  . levothyroxine (SYNTHROID, LEVOTHROID) 112 MCG tablet Take 1 tablet (112 mcg total) by mouth daily before breakfast. 30 tablet 0  . montelukast (SINGULAIR) 10 MG tablet Take 1 tablet (10 mg total) by mouth daily. (Patient taking differently: Take 10 mg by mouth at bedtime. ) 30 tablet 0  . nitroGLYCERIN (NITROSTAT) 0.4 MG SL tablet Place 1 tablet (0.4 mg total) under the tongue every 5 (five) minutes as needed for chest pain. 30 tablet 0  . Nutritional Supplements (CARNATION BREAKFAST ESSENTIALS PO) Take 237 mLs by mouth 2 (two) times daily.    . ondansetron (ZOFRAN) 4 MG tablet Take 1 tablet (4 mg total) by mouth every 8 (eight) hours as needed for nausea or vomiting. 30 tablet 0  . oxyCODONE (OXY IR/ROXICODONE) 5 MG immediate release tablet Take 1 tablet (5 mg total) by mouth every 8 (eight) hours as needed (pain). 6 tablet 0  . oxyCODONE-acetaminophen (PERCOCET) 5-325 MG tablet  Take one or two tablets by mouth every 6 hours as needed for pain. 32 tablet 0  . pantoprazole (PROTONIX) 40 MG tablet Take 1 tablet (40 mg total) by mouth 2 (two) times daily before a meal. (Patient taking differently: Take 40 mg by mouth daily before breakfast. ) 60 tablet 0  . potassium chloride SA (K-DUR,KLOR-CON) 20 MEQ tablet Take 2 tablets (40 mEq total) by mouth daily. 60 tablet 3    Allergies  Allergen Reactions  . Ace Inhibitors Anaphylaxis and Swelling  . Motrin Ib [Ibuprofen] Anaphylaxis      Social History   Socioeconomic History  . Marital status: Divorced    Spouse name: Not on file  . Number of children: Not on file  . Years of education: Not on file  . Highest education level: Not on file  Occupational History  . Not on file  Social Needs  . Financial resource strain: Not on file  . Food insecurity:    Worry: Not on file    Inability: Not on file  . Transportation needs:    Medical: Not on file    Non-medical: Not on file  Tobacco Use  . Smoking status: Former Smoker    Types: Cigarettes  . Smokeless tobacco: Never Used  Substance and Sexual Activity  . Alcohol use: No  . Drug use: No    Comment: marijuana in the past  . Sexual activity: Never  Lifestyle  . Physical activity:    Days per week: Not on file    Minutes per session: Not on file  . Stress: Not on file  Relationships  . Social connections:    Talks on phone: Not on file    Gets together: Not on file    Attends religious service: Not on file    Active member of club or organization: Not on file    Attends meetings of clubs or organizations: Not on file    Relationship status: Not on file  . Intimate partner violence:    Fear of current or ex partner: Not on file    Emotionally abused: Not on file    Physically abused: Not on file    Forced sexual activity: Not on file  Other Topics Concern  . Not on file  Social History Narrative  . Not on file      Family History  Problem  Relation  Age of Onset  . Hypertension Mother    Vitals:   02/09/18 1506  BP: 112/62  Pulse: (!) 52  SpO2: 96%  Weight: 57.1 kg (125 lb 12.8 oz)   Wt Readings from Last 3 Encounters:  02/09/18 57.1 kg (125 lb 12.8 oz)  01/26/18 59.1 kg (130 lb 6.4 oz)  01/11/18 59 kg (130 lb)    PHYSICAL EXAM: General:  No resp difficulty. HEENT: Normal Neck: Supple. JVP 6-7. Carotids 2+ bilat; no bruits. No thyromegaly or nodule noted. Cor: PMI nondisplaced. RRR, 3/6 HSM at apex Lungs: CTAB, normal effort. Abdomen: Soft, non-tender, non-distended, no HSM. No bruits or masses. +BS   Extremities: No cyanosis, clubbing, or rash. R and LLE no edema.  Neuro: Alert & orientedx3, cranial nerves grossly intact. moves all 4 extremities w/o difficulty. Affect pleasant   ASSESSMENT & PLAN: 1. CAD:  LHC in 1/19 showed severe ostial LCx disease and moderate LAD disease. She will need CABG with MVR in 8/19. Having more frequent CP with associated SOB over the last week, resolves with 1-2 SL nitro.  - Continue ASA 81 and atorvastatin 40 mg daily.  - Continue imdur 120 mg daily and Coreg 25 mg BID - EKG looks largely unchanged.  - Discussed with Dr Aundra Dubin. Will admit her for nitro drip. He will discuss with Dr Roxy Manns 2. Chronic diastolic CHF: ECHO 01/5276 EF 60-65% with severe MR. NYHA class IIIb symptoms, worse. Volume stable.  - Ultimately, needs mitral valve fixed to keep her out of CHF.  - Continue Lasix 120 mg bid. 3. CKD Stage IV:  Had R hydronephrosis in setting of hydronephrosis and required percutaneous drain in 9/18 (removed). Renal US 04/14/17 showed no further evidence of hydronephrosis.  - Creatinine most recently 4.4. Followed by nephrology. BMET today.        - She will be going for surgery, may need HD peri-surgery.   4. HTN: BP controlled today - Hold hydralazine and doxazosin for now with soft BP.   - Continue amlodipine 10 mg daily.  - Continue coreg 25 mg BID.   5.  Mitral regurgitation:   Severe. 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  Confirmed on 4/19 TEE.  Plan for mitral valve replacement 02/25/18.  6. Atrial fibrillation: Paroxysmal.  She is in NSR by exam today.  She had a spontaneous RP hemorrhage in 9/18 but is now back on Eliquis.   - Continue Eliquis 2.5 mg bid (creatinine > 1.5, low weight). Denies bleeding - She is off amiodarone due to junctional bradycardia in the hospital.  - Maze with MVR in 8/19. No change.  7. Anxiety: has PRN xanax  With more frequent CP responsive to SL nitro and known CAD, will admit for nitro and heparin drip. Dicussed with Dr Aundra Dubin.  Georgiana Shore, NP 02/09/18   Patient seen with NP, agree with the above note.    She is having more frequent angina, taking several NTG/day for the last week.  Has known severe disease with tight ostial LCx and moderate LAD on 1/19 cath. She has been awaiting CABG/MV repair/Maze.  Surgical planning has been complicated by CKD stage IV. She had anginal-type chest pain in the exam room and took NTG with relief but still has mild chest pain. ECG did not show significant changes.   On exam, no JVP 8 cm.  Clear lungs.  2/6 HSM apex.  No edema.   With progressive angina, I think she is going to need to be admitted.   -  I will hold Eliquis and start her on heparin gtt.  - Cycle troponin.  - Hold Imdur and start NTG gtt, can titrate up to keep CP-free.  - Discussed with pharmacist, will start ranolazine 500 mg bid.  Would not increase as not well-studied with renal dysfunction. Will follow ECG daily.  - Continue Coreg and amlodipine but hold hydralazine and doxazosin with BP on the lower side.  - Add ASA 81.   She remains in NSR.  Holding Eliquis for now.    Volume status looks ok, continue her current furosemide dosing.   CKD stage IV, concern that she will progress to ESRD peri-op.  Will ask renal to see her prior to cardiac surgery. Likely should have dialysis access pre-op.   I alerted Dr. Roxy Manns that she is going to be  admitted.  We will try to stabilize her angina, but I think that she will need to have surgery moved earlier.  She is currently scheduled for CABG/MVR/Maze on 8/29.   [name] 02/09/2018 4:30 PM

## 2018-02-09 NOTE — Progress Notes (Signed)
ANTICOAGULATION CONSULT NOTE - Initial Consult  Pharmacy Consult for heparin Indication: chest pain/ACS and atrial fibrillation  Allergies  Allergen Reactions  . Ace Inhibitors Anaphylaxis and Swelling  . Motrin Ib [Ibuprofen] Anaphylaxis    Patient Measurements:   Heparin Dosing Weight: 57kg  Vital Signs: BP: 112/62 (08/13 1506) Pulse Rate: 52 (08/13 1506)  Labs: No results for input(s): HGB, HCT, PLT, APTT, LABPROT, INR, HEPARINUNFRC, HEPRLOWMOCWT, CREATININE, CKTOTAL, CKMB, TROPONINI in the last 72 hours.  Estimated Creatinine Clearance: 11.3 mL/min (A) (by C-G formula based on SCr of 4.4 mg/dL (H)).   Medical History: Past Medical History:  Diagnosis Date  . Anemia    low iron  . Anxiety   . Arthritis   . Bronchitis   . CHF (congestive heart failure) ([place])   . CKD (chronic kidney disease) stage 4, GFR 15-29 ml/min (HCC) 03/16/2017  . Coronary artery disease   . Depression   . Diabetes mellitus without complication ([place])   . Diet-controlled diabetes mellitus ([place])   . Ganglion cyst    left wrist  . GERD (gastroesophageal reflux disease)   . Headache    chronic headaches  . Heart failure ([place])   . Hyperlipidemia   . Hypertension   . Hypothyroidism   . Myocardial infarction ([place])    3x last one 2008  . PAF (paroxysmal atrial fibrillation) ([place])   . Pneumonia    x 2  . Stroke [facility])    no lasting residual - ? 2014  . Umbilical hernia    Assessment:  67 y.o.femalewith h/o mitral regurgitation, chronic diastolic CHF, CAD s/p MI, long standing HTN, DM2, CKD IV-V, h/o Stroke. Anxiety, depression and GERD.   Patient with history of afib off anticoagulation for a period of time and now back on apixaban. Last dose taken this morning. Patient being admitted to be re-evaluated for cardiac surgery. Received orders to start heparin, will start tonight with apixaban received this morning.   Goal of Therapy:  Heparin level 0.3-0.7 units/ml aPTT 66-102  seconds Monitor platelets by anticoagulation protocol: Yes   Plan:  Start heparin infusion at 800 units/hr Check anti-Xa level in 6 hours and daily while on heparin Continue to monitor H&H and platelets  Erin Hearing PharmD., BCPS Clinical Pharmacist 02/09/2018 4:34 PM

## 2018-02-10 ENCOUNTER — Other Ambulatory Visit: Payer: Self-pay

## 2018-02-10 ENCOUNTER — Inpatient Hospital Stay (HOSPITAL_COMMUNITY): Payer: Medicare Other

## 2018-02-10 ENCOUNTER — Encounter (HOSPITAL_COMMUNITY): Payer: Self-pay | Admitting: General Practice

## 2018-02-10 DIAGNOSIS — I2510 Atherosclerotic heart disease of native coronary artery without angina pectoris: Secondary | ICD-10-CM

## 2018-02-10 DIAGNOSIS — I4891 Unspecified atrial fibrillation: Secondary | ICD-10-CM

## 2018-02-10 DIAGNOSIS — I340 Nonrheumatic mitral (valve) insufficiency: Secondary | ICD-10-CM

## 2018-02-10 DIAGNOSIS — I5032 Chronic diastolic (congestive) heart failure: Secondary | ICD-10-CM

## 2018-02-10 DIAGNOSIS — I361 Nonrheumatic tricuspid (valve) insufficiency: Secondary | ICD-10-CM

## 2018-02-10 LAB — IRON AND TIBC
Iron: 120 ug/dL (ref 28–170)
Saturation Ratios: 49 % — ABNORMAL HIGH (ref 10.4–31.8)
TIBC: 245 ug/dL — ABNORMAL LOW (ref 250–450)
UIBC: 125 ug/dL

## 2018-02-10 LAB — CBC
HCT: 29.8 % — ABNORMAL LOW (ref 36.0–46.0)
Hemoglobin: 10 g/dL — ABNORMAL LOW (ref 12.0–15.0)
MCH: 26.3 pg (ref 26.0–34.0)
MCHC: 33.6 g/dL (ref 30.0–36.0)
MCV: 78.4 fL (ref 78.0–100.0)
Platelets: 187 10*3/uL (ref 150–400)
RBC: 3.8 MIL/uL — ABNORMAL LOW (ref 3.87–5.11)
RDW: 13.3 % (ref 11.5–15.5)
WBC: 4.5 10*3/uL (ref 4.0–10.5)

## 2018-02-10 LAB — BASIC METABOLIC PANEL
Anion gap: 10 (ref 5–15)
BUN: 38 mg/dL — ABNORMAL HIGH (ref 8–23)
CO2: 23 mmol/L (ref 22–32)
Calcium: 9 mg/dL (ref 8.9–10.3)
Chloride: 105 mmol/L (ref 98–111)
Creatinine, Ser: 4.22 mg/dL — ABNORMAL HIGH (ref 0.44–1.00)
GFR calc Af Amer: 12 mL/min — ABNORMAL LOW (ref >60)
GFR calc non Af Amer: 10 mL/min — ABNORMAL LOW (ref >60)
Glucose, Bld: 111 mg/dL — ABNORMAL HIGH (ref 70–99)
Potassium: 3.7 mmol/L (ref 3.5–5.1)
Sodium: 138 mmol/L (ref 135–145)

## 2018-02-10 LAB — APTT
aPTT: 66 seconds — ABNORMAL HIGH (ref 24–36)
aPTT: 52 seconds — ABNORMAL HIGH (ref 24–36)

## 2018-02-10 LAB — TROPONIN I: Troponin I: <0.03 ng/mL (ref <0.03)

## 2018-02-10 LAB — GLUCOSE, CAPILLARY
Glucose-Capillary: 106 mg/dL — ABNORMAL HIGH (ref 70–99)
Glucose-Capillary: 137 mg/dL — ABNORMAL HIGH (ref 70–99)
Glucose-Capillary: 171 mg/dL — ABNORMAL HIGH (ref 70–99)
Glucose-Capillary: 88 mg/dL (ref 70–99)

## 2018-02-10 LAB — ECHOCARDIOGRAM COMPLETE
Height: 69 in
Weight: 1978.85 oz

## 2018-02-10 LAB — HEPARIN LEVEL (UNFRACTIONATED): Heparin Unfractionated: >2.2 IU/mL — ABNORMAL HIGH (ref 0.30–0.70)

## 2018-02-10 MED ORDER — MORPHINE SULFATE (PF) 2 MG/ML IV SOLN
1.0000 mg | INTRAVENOUS | Status: DC | PRN
  Administered 2018-02-10 (×3): 1 mg via INTRAVENOUS
  Administered 2018-02-10 – 2018-02-13 (×4): 2 mg via INTRAVENOUS
  Administered 2018-02-13: 1 mg via INTRAVENOUS
  Administered 2018-02-13: 2 mg via INTRAVENOUS
  Administered 2018-02-13: 1 mg via INTRAVENOUS
  Administered 2018-02-13 – 2018-02-14 (×2): 2 mg via INTRAVENOUS
  Administered 2018-02-14: 1 mg via INTRAVENOUS
  Administered 2018-02-15 (×2): 2 mg via INTRAVENOUS
  Administered 2018-02-16: 1 mg via INTRAVENOUS
  Administered 2018-02-17: 2 mg via INTRAVENOUS
  Filled 2018-02-10 (×17): qty 1

## 2018-02-10 MED ORDER — HYDRALAZINE HCL 50 MG PO TABS
25.0000 mg | ORAL_TABLET | Freq: Three times a day (TID) | ORAL | Status: DC
  Administered 2018-02-10 – 2018-02-12 (×7): 25 mg via ORAL
  Filled 2018-02-10 (×7): qty 1

## 2018-02-10 MED ORDER — LEVOTHYROXINE SODIUM 112 MCG PO TABS
112.0000 ug | ORAL_TABLET | Freq: Every day | ORAL | Status: DC
  Administered 2018-02-11 – 2018-03-05 (×22): 112 ug via ORAL
  Filled 2018-02-10 (×22): qty 1

## 2018-02-10 MED ORDER — OXYCODONE HCL 5 MG PO TABS
5.0000 mg | ORAL_TABLET | Freq: Four times a day (QID) | ORAL | Status: DC | PRN
  Administered 2018-02-10 – 2018-02-18 (×16): 5 mg via ORAL
  Filled 2018-02-10 (×17): qty 1

## 2018-02-10 MED ORDER — GLUCERNA SHAKE PO LIQD
237.0000 mL | Freq: Three times a day (TID) | ORAL | Status: DC
  Administered 2018-02-10 – 2018-02-17 (×16): 237 mL via ORAL
  Filled 2018-02-10 (×4): qty 237

## 2018-02-10 NOTE — Progress Notes (Signed)
ANTICOAGULATION CONSULT NOTE - Follow-Up Consult  Pharmacy Consult for heparin Indication: chest pain/ACS and atrial fibrillation  Allergies  Allergen Reactions  . Ace Inhibitors Anaphylaxis and Swelling  . Motrin Ib [Ibuprofen] Anaphylaxis    Patient Measurements: Height: 5\' 9"  (175.3 cm) Weight: 123 lb 10.9 oz (56.1 kg) IBW/kg (Calculated) : 66.2 Heparin Dosing Weight: 57kg  Vital Signs: Temp: 97.6 F (36.4 C) (08/14 1632) Temp Source: Oral (08/14 1632) BP: 129/68 (08/14 1632) Pulse Rate: 58 (08/14 1632)  Labs: Recent Labs    02/09/18 1547 02/09/18 1600 02/09/18 2147 02/10/18 0400 02/10/18 1534  HGB 10.7*  --   --  10.0*  --   HCT 32.1*  --   --  29.8*  --   PLT 207  --   --  187  --   APTT  --  34  --  66* 52*  LABPROT 16.6*  --   --   --   --   INR 1.36  --   --   --   --   HEPARINUNFRC  --  >2.20*  --  >2.20*  --   CREATININE 4.48*  --   --  4.22*  --   TROPONINI <0.03  --  <0.03 <0.03  --     Estimated Creatinine Clearance: 11.6 mL/min (A) (by C-G formula based on SCr of 4.22 mg/dL (H)).   Medical History: Past Medical History:  Diagnosis Date  . Anemia    low iron  . Anxiety   . Arthritis   . Bronchitis   . CHF (congestive heart failure) ([place])   . CKD (chronic kidney disease) stage 4, GFR 15-29 ml/min (HCC) 03/16/2017  . Coronary artery disease   . Depression   . Diabetes mellitus without complication ([place])   . Diet-controlled diabetes mellitus ([place])   . Ganglion cyst    left wrist  . GERD (gastroesophageal reflux disease)   . Headache    chronic headaches  . Heart failure ([place])   . Hyperlipidemia   . Hypertension   . Hypothyroidism   . Mitral regurgitation   . Myocardial infarction ([place])    3x last one 2008  . PAF (paroxysmal atrial fibrillation) ([place])   . Pneumonia    x 2  . Stroke [facility])    no lasting residual - ? 2014  . Umbilical hernia    Assessment:  87 yoF on apixaban for AFib admitted for cardiac surgery evaluation.  Pharmacy to bridge with IV heparin while awaiting recommendations. Initial heparin level elevated due to apixaban, aPTT therapeutic at 66 seconds but on the low end of goal.   Follow up aptt now below goal at 52s on 850 units/hr. Will increase rate.   Goal of Therapy:  Heparin level 0.3-0.7 units/ml aPTT 66-102 seconds Monitor platelets by anticoagulation protocol: Yes   Plan:  Increase heparin to 1000 units/hr Recheck aPTT in [place] PharmD., BCPS Clinical Pharmacist 02/10/2018 4:59 PM

## 2018-02-10 NOTE — Progress Notes (Signed)
ANTICOAGULATION CONSULT NOTE - Follow-Up Consult  Pharmacy Consult for heparin Indication: chest pain/ACS and atrial fibrillation  Allergies  Allergen Reactions  . Ace Inhibitors Anaphylaxis and Swelling  . Motrin Ib [Ibuprofen] Anaphylaxis    Patient Measurements: Height: 5\' 9"  (175.3 cm) Weight: 123 lb 10.9 oz (56.1 kg) IBW/kg (Calculated) : 66.2 Heparin Dosing Weight: 57kg  Vital Signs: Temp: 98.2 F (36.8 C) (08/14 0315) Temp Source: Oral (08/14 0315) BP: 131/77 (08/14 0315) Pulse Rate: 59 (08/14 0315)  Labs: Recent Labs    02/09/18 1547 02/09/18 1600 02/09/18 2147 02/10/18 0400  HGB 10.7*  --   --  10.0*  HCT 32.1*  --   --  29.8*  PLT 207  --   --  187  APTT  --  34  --  66*  LABPROT 16.6*  --   --   --   INR 1.36  --   --   --   HEPARINUNFRC  --  >2.20*  --  >2.20*  CREATININE 4.48*  --   --  4.22*  TROPONINI <0.03  --  <0.03 <0.03    Estimated Creatinine Clearance: 11.6 mL/min (A) (by C-G formula based on SCr of 4.22 mg/dL (H)).   Medical History: Past Medical History:  Diagnosis Date  . Anemia    low iron  . Anxiety   . Arthritis   . Bronchitis   . CHF (congestive heart failure) ([place])   . CKD (chronic kidney disease) stage 4, GFR 15-29 ml/min (HCC) 03/16/2017  . Coronary artery disease   . Depression   . Diabetes mellitus without complication ([place])   . Diet-controlled diabetes mellitus ([place])   . Ganglion cyst    left wrist  . GERD (gastroesophageal reflux disease)   . Headache    chronic headaches  . Heart failure ([place])   . Hyperlipidemia   . Hypertension   . Hypothyroidism   . Myocardial infarction ([place])    3x last one 2008  . PAF (paroxysmal atrial fibrillation) ([place])   . Pneumonia    x 2  . Stroke [facility])    no lasting residual - ? 2014  . Umbilical hernia    Assessment:  79 yoF on apixaban for AFib admitted for cardiac surgery evaluation. Pharmacy to bridge with IV heparin while awaiting recommendations. Initial heparin level  elevated due to apixaban, aPTT therapeutic at 66 seconds but on the low end of goal.   Goal of Therapy:  Heparin level 0.3-0.7 units/ml aPTT 66-102 seconds Monitor platelets by anticoagulation protocol: Yes   Plan:  Increase heparin slightly to 850 units/hr Recheck aPTT in [place], PharmD, BCPS Clinical Pharmacist [phone] Please check AMION for all [place] numbers 02/10/2018

## 2018-02-10 NOTE — Progress Notes (Signed)
Initial Nutrition Assessment  DOCUMENTATION CODES:   Non-severe (moderate) malnutrition in context of chronic illness, Underweight  INTERVENTION:   -Glucerna Shake po TID, each supplement provides 220 kcal and 10 grams of protein -MVI daily  NUTRITION DIAGNOSIS:   Moderate Malnutrition related to chronic illness(CHF) as evidenced by percent weight loss, energy intake < 75% for > or equal to 1 month, mild fat depletion, moderate fat depletion, mild muscle depletion, moderate muscle depletion.  GOAL:   Patient will meet greater than or equal to 90% of their needs  MONITOR:   PO intake, Supplement acceptance, Labs, Weight trends, Skin, I & O's  REASON FOR ASSESSMENT:   Malnutrition Screening Tool    ASSESSMENT:   Jocelyn Hill is a 67 y.o. female with h/o mitral regurgitation, chronic diastolic CHF, CAD s/p MI, long standing HTN, DM2, CKD IV-V, h/o Stroke. Anxiety, depression and GERD. Patient reported that she has had 2 MIs treated at [facility] in [place] several years ago.  Pt admitted with CHF.  Pt admitted with CHF.   Per CVTS notes, pt will be scheduled for MVR/CABG and possible MAZE during hospitalization.  Spoke with pt at bedside, who was consuming lunch at time of visit (pt had consumed about 75% of meal). Pt reports decreased appetite over the past 3 months, which she attributes to chronic illnesses and prior hospitalizations. She reports she no longer has an appetite; typical intake is 1-2 meals per day (Bagel at breakfast, skips lunch, and a meat, starch, and vegetable at dinner, which is usually her biggest meal). Noted documented meal completion 100%.   Pt endorses approximately 20# wt loss over the past 3 months. Per wt hx, pt has experienced a 9.7% wt loss over the past 3 months, which is significant for time frame. Pt has also experienced decline in mobility- it is much more difficult for her to do everyday tasks related to shortness of breath and  overall decreased endurance.   Pt was not consuming supplements PTA, however, has had them in the past. She is unsure which supplements she likes, but preferred chocolate flavor. Due to good meal completion, will trial Glucerna supplements due to availability of chocolate flavor and attempt to continue to stabilize CBGS. Discussed importance of good meal and supplement intake to promote healing.   Last Hgb A1c: 6.9 (12/21/17). Pt on no DM medications PTA.  Labs reviewed: CBGS: 88-106 (inpatient orders for glycemic control are 0-9 units insulin aspart TID with meals).   NUTRITION - FOCUSED PHYSICAL EXAM:    Most Recent Value  Orbital Region  Moderate depletion  Upper Arm Region  Moderate depletion  Thoracic and Lumbar Region  Mild depletion  Buccal Region  Moderate depletion  Temple Region  Mild depletion  Clavicle Bone Region  Moderate depletion  Clavicle and Acromion Bone Region  Mild depletion  Scapular Bone Region  Mild depletion  Dorsal Hand  Mild depletion  Patellar Region  Moderate depletion  Anterior Thigh Region  Moderate depletion  Posterior Calf Region  Moderate depletion  Edema (RD Assessment)  None  Hair  Reviewed  Eyes  Reviewed  Mouth  Reviewed  Skin  Reviewed  Nails  Reviewed       Diet Order:   Diet Order            Diet 2 gram sodium Room service appropriate? Yes; Fluid consistency: Thin  Diet effective now              EDUCATION  NEEDS:   Education needs have been addressed  Skin:  Skin Assessment: Reviewed RN Assessment  Last BM:  02/08/18  Height:   Ht Readings from Last 1 Encounters:  02/09/18 5\' 9"  (1.753 m)    Weight:   Wt Readings from Last 1 Encounters:  02/10/18 56.1 kg    Ideal Body Weight:  65.9 kg  BMI:  Body mass index is 18.26 kg/m.  Estimated Nutritional Needs:   Kcal:  6237-6283  Protein:  95-110 grams  Fluid:  1.7-1.9 L    [place] A. Jimmye Norman, RD, LDN, CDE Pager: [phone] After hours Pager: [phone]

## 2018-02-10 NOTE — Progress Notes (Addendum)
Patient ID: Jocelyn Hill, female   DOB: [birth year], 67 y.o.   MRN: [id number]     Advanced Heart Failure Rounding Note  PCP-Cardiologist: No primary care provider on file.   Subjective:    Headache overnight, did not sleep well.  Substernal chest pain resolved with NTG gtt overnight.  No dyspnea at rest.  Troponin negative.   Objective:   Weight Range: 56.1 kg Body mass index is 18.26 kg/m.   Vital Signs:   Temp:  [97.5 F (36.4 C)-98.2 F (36.8 C)] 98.2 F (36.8 C) (08/14 0315) Pulse Rate:  [59-66] 59 (08/14 0315) Resp:  [15-28] 16 (08/14 0315) BP: (119-140)/(70-80) 131/77 (08/14 0315) SpO2:  [99 %-100 %] 99 % (08/14 0315) Weight:  [56.1 kg-57.5 kg] 56.1 kg (08/14 0315) Last BM Date: 02/08/18  Weight change: Filed Weights   02/09/18 1834 02/10/18 0315  Weight: 57.5 kg 56.1 kg    Intake/Output:   Intake/Output Summary (Last 24 hours) at 02/10/2018 0733 Last data filed at 02/10/2018 0617 Gross per 24 hour  Intake 763.1 ml  Output 1875 ml  Net -1111.9 ml      Physical Exam    General:  Well appearing. No resp difficulty HEENT: Normal Neck: Supple. JVP not elevated. Carotids 2+ bilat; no bruits. No lymphadenopathy or thyromegaly appreciated. Cor: PMI nondisplaced. Regular rate & rhythm. No rubs, gallops. 3/6 HSM apex. Lungs: Clear Abdomen: Soft, nontender, nondistended. No hepatosplenomegaly. No bruits or masses. Good bowel sounds. Extremities: No cyanosis, clubbing, rash, edema Neuro: Alert & orientedx3, cranial nerves grossly intact. moves all 4 extremities w/o difficulty. Affect pleasant   Telemetry   NSR in 60s (personally reviewed)  EKG    NSR, old anterior MI, QTc 455 msec (similar to prior)  Labs    CBC Recent Labs    02/09/18 1547 02/10/18 0400  WBC 5.8 4.5  HGB 10.7* 10.0*  HCT 32.1* 29.8*  MCV 79.9 78.4  PLT 207 235   Basic Metabolic Panel Recent Labs    02/09/18 1547 02/10/18 0400  NA 141 138  K 3.3* 3.7  CL 106 105  CO2 25  23  GLUCOSE 153* 111*  BUN 36* 38*  CREATININE 4.48* 4.22*  CALCIUM 9.4 9.0   Liver Function Tests No results for input(s): AST, ALT, ALKPHOS, BILITOT, PROT, ALBUMIN in the last 72 hours. No results for input(s): LIPASE, AMYLASE in the last 72 hours. Cardiac Enzymes Recent Labs    02/09/18 1547 02/09/18 2147 02/10/18 0400  TROPONINI <0.03 <0.03 <0.03    BNP: BNP (last 3 results) Recent Labs    03/12/17 1400 02/09/18 1547  BNP 2,230.5* 1,320.1*    ProBNP (last 3 results) No results for input(s): PROBNP in the last 8760 hours.   D-Dimer No results for input(s): DDIMER in the last 72 hours. Hemoglobin A1C No results for input(s): HGBA1C in the last 72 hours. Fasting Lipid Panel No results for input(s): CHOL, HDL, LDLCALC, TRIG, CHOLHDL, LDLDIRECT in the last 72 hours. Thyroid Function Tests No results for input(s): TSH, T4TOTAL, T3FREE, THYROIDAB in the last 72 hours.  Invalid input(s): FREET3  Other results:   Imaging     No results found.   Medications:     Scheduled Medications: . amLODipine  10 mg Oral Daily  . aspirin EC  81 mg Oral Daily  . atorvastatin  40 mg Oral Daily  . B-complex with vitamin C  1 tablet Oral Daily  . buPROPion  100 mg Oral TID AC  .  busPIRone  15 mg Oral BID  . carvedilol  25 mg Oral BID PC  . cholecalciferol  1,000 Units Oral Daily  . DULoxetine  30 mg Oral Daily  . furosemide  120 mg Oral BID  . gabapentin  300 mg Oral QHS  . hydrALAZINE  25 mg Oral Q8H  . insulin aspart  0-9 Units Subcutaneous TID WC  . levothyroxine  112 mcg Oral QAC breakfast  . montelukast  10 mg Oral Daily  . pantoprazole  40 mg Oral Daily  . potassium chloride SA  40 mEq Oral Daily  . ranolazine  500 mg Oral BID  . sodium chloride flush  3 mL Intravenous Q12H     Infusions: . sodium chloride    . heparin 850 Units/hr (02/10/18 0727)  . nitroGLYCERIN 35 mcg/min (02/10/18 0500)     PRN Medications:  sodium chloride, acetaminophen,  albuterol, ALPRAZolam, morphine injection, ondansetron (ZOFRAN) IV, ondansetron, oxyCODONE, sodium chloride flush   Assessment/Plan   1. CAD:  LHC in 1/19 showed severe ostial LCx disease and moderate LAD disease. She will need CABG with MVR. Having more frequent CP with associated SOB over the last week, resolves with 1-2 SL nitro.  She is now having several episodes/day.  She was admitted from clinic on 8/13 due to chest pain at rest in clinic that was prolonged. She has been started on NTG gtt with resolution of pain this morning.  Also in discussion with pharmacy, I added ranolazine. Troponin negative and ECG unchanged, she seems to have unstable angina.  - Continue ASA 81 and atorvastatin 40 mg daily. - She is now on heparin gtt.   - Continue amlodipine 10 mg daily and Coreg 25 mg BID for angina.  - Continue NTG gtt for now at 35 mcg/min.   - Added ranolazine 500 mg bid.  Discussed with pharmacy, not well studied with CKD IV but not contra-indicated.  Will follow ECG daily for QT interval.  - Have alerted Dr. Roxy Manns that she was admitted, given worsening chest pain episodes not sure she will be able to wait until 8/29 for surgery (CABG/MVR/Maze).  Will see if there is a way to move surgery forwards.  2. 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 Stable NYHA class III dyspnea.  This is complicated by CKD stage IV. Volume status looks ok.  - Ultimately, needs mitral valve fixed to keep her out of CHF.  -Continue Lasix 120 mg bid. - Echo today.  3. CKD Stage IV: Creatinine stable at 4.22 today.  She was supposed to see nephrology pre-op as she may need HD peri-surgery (?place HD catheter prior).  Will ask renal to see her since she is now here.  4. HTN:SBP 130s.  Can add back a lower dose of her home hydralazine, titrate up as needed.  Hold off on doxazosin for now unless BP climbing.  She remains on Coreg and amlodipine.  5.  Mitral regurgitation:  Severe.  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 Confirmed on 4/19 TEE.  Needs MVR, discuss timing with Dr. Roxy Manns, had been scheduled for 8/29.  6. Atrial fibrillation: Paroxysmal.  NSR today. She had a spontaneous RP hemorrhage in 9/18 but has been back on Eliquis.   - Eliquis held and heparin gtt started with active chest pain.  - She is off amiodarone due to h/o junctional bradycardia.  - Maze planned with MVR.    Length of Stay: 1  [name], MD  02/10/2018, 7:33 AM  Advanced Heart Failure Team Pager [phone] (M-F; [place])  Please contact [facility] for night-coverage after hours (4p -7a ) and weekends on [email or web address]

## 2018-02-10 NOTE — Consult Note (Signed)
Reason for Consult:CKD 4-5  Referring Physician: Dr. Alvera Novel is an 67 y.o. female.  HPI: 67 yr female with long hx DM , HTN, seen first by [place] 9.10 with AKI.CKD4 in setting of Urosepsis,R Hydro, CHF. 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  Was not felt to be candidate at that time for MVR.  Hosp after that with CHF, and AKI.  Followed by Dr. Hollie Salk.  Now has been scheduled for MVR and CABG. Admitted earlier than scheduled because of accelerated angina, to stabilize and move up op.  Cr baseline in past 6 mon 3-8-4.1. Now is 4.2.  Had been referred in 6/19 for vasc access but was deferred to after cardiac surgery by [facility].   Has leg cramps, poor appetite,cold a lot.  Has mod anemia, known HPTH.  Asked to see in prep for op as high likelihood of needing dialysis post op. Constitutional: low energy, cold, does not feel well Eyes: blurry  vision Ears, nose, mouth, throat, and face: scratchy throat Respiratory: cough, SOB with minimal activity Cardiovascular: as above, no edema, CP several times/d Gastrointestinal: indigestion Genitourinary:noct x 4  Integument/breast: bumps on hands Hematologic/lymphatic: cold a lot Musculoskeletal:aching in calves at rest Neurological: numbness in fingers Endocrine: DM in 100s Allergic/Immunologic: ACEI, Motrin   Primary Nephrologist Hollie Salk.  .  Past Medical History:  Diagnosis Date  . Anemia    low iron  . Anxiety   . Arthritis   . Bronchitis   . CHF (congestive heart failure) ([place])   . CKD (chronic kidney disease) stage 4, GFR 15-29 ml/min (H[facility]) 03/16/2017  . Coronary artery disease   . Depression   . Diabetes mellitus without complication ([place])   . Diet-controlled diabetes mellitus ([place])   . Ganglion cyst    left wrist  . GERD (gastroesophageal reflux disease)   . Headache    chronic headaches  . Heart failure ([place])   . Hyperlipidemia   . Hypertension   . Hypothyroidism   . Myocardial infarction ([place])    3x  last one 2008  . PAF (paroxysmal atrial fibrillation) ([place])   . Pneumonia    x 2  . Stroke [facility])    no lasting residual - ? 2014  . Umbilical hernia     Past Surgical History:  Procedure Laterality Date  . ABDOMINAL HYSTERECTOMY     PARTIALS  . CARDIAC CATHETERIZATION    . CESAREAN SECTION    . IR NEPHRO TUBE REMOV/FL  04/02/2017  . IR NEPHROSTOGRAM RIGHT THRU EXISTING ACCESS  04/02/2017  . IR NEPHROSTOMY PLACEMENT RIGHT  03/27/2017  . IR THORACENTESIS ASP PLEURAL SPACE W/IMG GUIDE  03/24/2017  . MULTIPLE EXTRACTIONS WITH ALVEOLOPLASTY N/A 11/05/2017   Procedure: Extraction of tooth #'s 4,5,7,8,9,10,12,14 and 29 with alveoloplasty and gross debridement of remaining teeth;  Surgeon: [name], DDS;  Location: [place];  Service: Oral Surgery;  Laterality: N/A;  . RIGHT/LEFT HEART CATH AND CORONARY ANGIOGRAPHY N/A 07/03/2017   Procedure: RIGHT/LEFT HEART CATH AND CORONARY ANGIOGRAPHY;  Surgeon: Larey Dresser, MD;  Location: [place] [facility];  Service: Cardiovascular;  Laterality: N/A;  . TEE WITHOUT CARDIOVERSION N/A 10/08/2017   Procedure: TRANSESOPHAGEAL ECHOCARDIOGRAM (TEE);  Surgeon: Larey Dresser, MD;  Location[facility] ENDOSCOPY;  Service: Cardiovascular;  Laterality: N/A;  . TONSILLECTOMY    . TUBAL LIGATION      Family History  Problem Relation Age of Onset  . Hypertension Mother  Social History:  reports that she has quit smoking. Her smoking use included cigarettes. She has never used smokeless tobacco. She reports that she does not drink alcohol or use drugs.  Allergies:  Allergies  Allergen Reactions  . Ace Inhibitors Anaphylaxis and Swelling  . Motrin Ib [Ibuprofen] Anaphylaxis    Medications:  I have reviewed the patient's current medications. Prior to Admission:  Medications Prior to Admission  Medication Sig Dispense Refill Last Dose  . acetaminophen (TYLENOL) 325 MG tablet Take 2 tablets (650 mg total) by mouth every 6 (six) hours as needed for mild  pain, fever or headache. 30 tablet 0 unk at prn  . albuterol (PROVENTIL HFA;VENTOLIN HFA) 108 (90 Base) MCG/ACT inhaler Inhale 2 puffs into the lungs every 6 (six) hours as needed for wheezing or shortness of breath.   02/09/2018 at Unknown time  . ALPRAZolam (XANAX) 0.25 MG tablet Take 1 tablet (0.25 mg total) by mouth at bedtime as needed for anxiety. 20 tablet 0 02/08/2018 at Unknown time  . amLODipine (NORVASC) 10 MG tablet Take 1 tablet (10 mg total) by mouth daily. 30 tablet 0 02/09/2018 at 1230  . aspirin EC 81 MG EC tablet Take 1 tablet (81 mg total) by mouth daily. 30 tablet 0 02/09/2018 at 1230  . atorvastatin (LIPITOR) 40 MG tablet Take 1 tablet (40 mg total) by mouth daily. 30 tablet 0 02/09/2018 at Unknown time  . b complex vitamins tablet Take 1 tablet by mouth daily.   02/09/2018 at Unknown time  . bisacodyl (DULCOLAX) 5 MG EC tablet Take 5 mg by mouth daily as needed for mild constipation or moderate constipation.   02/09/2018 at Unknown time  . buPROPion (WELLBUTRIN) 100 MG tablet Take 100 mg by mouth 3 (three) times daily.   02/09/2018 at Unknown time  . busPIRone (BUSPAR) 15 MG tablet Take 1 tablet (15 mg total) by mouth 2 (two) times daily. 60 tablet 0 02/09/2018 at Unknown time  . calcium carbonate (TUMS EX) 750 MG chewable tablet Chew 2 tablets by mouth as needed for heartburn.   02/08/2018 at Unknown time  . carvedilol (COREG) 25 MG tablet Take 25 mg by mouth 2 (two) times daily with a meal.   02/09/2018 at 1230  . chlorhexidine (PERIDEX) 0.12 % solution Rinse with 15 mls twice daily for 30 seconds. Use after breakfast and at bedtime. Spit out excess. Do not swallow. (Patient taking differently: 15 mLs by Mouth Rinse route 2 (two) times daily as needed (for mouth discomfort). Rinse with 15 mls twice daily for 30 seconds. Use after breakfast and at bedtime. Spit out excess. Do not swallow.) 960 mL prn Past Week at Unknown time  . cholecalciferol (VITAMIN D) 1000 units tablet Take 1,000 Units  by mouth daily.   02/09/2018 at Unknown time  . diphenhydrAMINE (BENADRYL) 25 MG tablet Take 25 mg by mouth as needed for allergies.   02/08/2018 at Unknown time  . doxazosin (CARDURA) 8 MG tablet Take 8 mg by mouth daily. Takes 1 tablet in the AM and 0.5 Tablet in the PM.   02/09/2018 at Unknown time  . DULoxetine (CYMBALTA) 30 MG capsule Take 1 capsule (30 mg total) by mouth daily. 30 capsule 0 02/09/2018 at Unknown time  . ELIQUIS 2.5 MG TABS tablet Take 2.5 mg by mouth 2 (two) times daily.  10 02/09/2018 at 1230  . furosemide (LASIX) 80 MG tablet Take 1.5 tablets (120 mg total) by mouth 2 (two) times daily. 270 tablet  3 02/09/2018 at 1230  . gabapentin (NEURONTIN) 300 MG capsule Take 300 mg by mouth at bedtime.  11 02/08/2018 at Unknown time  . hydrALAZINE (APRESOLINE) 100 MG tablet Take 100 mg by mouth 2 (two) times daily.   02/09/2018 at 1230  . isosorbide mononitrate (IMDUR) 120 MG 24 hr tablet Take 1 tablet (120 mg total) by mouth daily. 30 tablet 0 02/09/2018 at 1230  . levothyroxine (SYNTHROID, LEVOTHROID) 112 MCG tablet Take 1 tablet (112 mcg total) by mouth daily before breakfast. 30 tablet 0 02/09/2018 at 1230  . Liniments (BEN GAY EX) Apply 1 application topically as needed (back pain).   Past Week at Unknown time  . montelukast (SINGULAIR) 10 MG tablet Take 1 tablet (10 mg total) by mouth daily. (Patient taking differently: Take 10 mg by mouth at bedtime. ) 30 tablet 0 02/08/2018 at Unknown time  . nitroGLYCERIN (NITROSTAT) 0.4 MG SL tablet Place 1 tablet (0.4 mg total) under the tongue every 5 (five) minutes as needed for chest pain. 30 tablet 0 02/09/2018 at 1530  . Nutritional Supplements (CARNATION BREAKFAST ESSENTIALS PO) Take 237 mLs by mouth 2 (two) times daily.   Past Week at Unknown time  . ondansetron (ZOFRAN) 4 MG tablet Take 1 tablet (4 mg total) by mouth every 8 (eight) hours as needed for nausea or vomiting. 30 tablet 0 01/26/2018  . oxyCODONE (OXY IR/ROXICODONE) 5 MG immediate release  tablet Take 1 tablet (5 mg total) by mouth every 8 (eight) hours as needed (pain). 6 tablet 0 02/08/2018 at Unknown time  . oxyCODONE-acetaminophen (PERCOCET) 5-325 MG tablet Take one or two tablets by mouth every 6 hours as needed for pain. 32 tablet 0 02/07/2018  . pantoprazole (PROTONIX) 40 MG tablet Take 1 tablet (40 mg total) by mouth 2 (two) times daily before a meal. (Patient taking differently: Take 40 mg by mouth daily before breakfast. ) 60 tablet 0 02/09/2018 at Unknown time  . potassium chloride SA (K-DUR,KLOR-CON) 20 MEQ tablet Take 2 tablets (40 mEq total) by mouth daily. 60 tablet 3 02/09/2018 at Unknown time  . sodium-potassium bicarbonate (ALKA-SELTZER GOLD) TBEF dissolvable tablet Take 4 tablets by mouth daily as needed (heartburn).   02/08/2018 at Unknown time    Results for orders placed or performed during the hospital encounter of 02/09/18 (from the past 48 hour(s))  Heparin level (unfractionated)     Status: Abnormal   Collection Time: 02/09/18  4:00 PM  Result Value Ref Range   Heparin Unfractionated >2.20 (H) 0.30 - 0.70 IU/mL    Comment: RESULTS CONFIRMED BY MANUAL DILUTION Performed at [facility], [address] [place], [place] [address]   APTT     Status: None   Collection Time: 02/09/18  4:00 PM  Result Value Ref Range   aPTT 34 24 - 36 seconds    Comment: Performed at [facility] [address] [place], [place] [address]  Glucose, capillary     Status: None   Collection Time: 02/09/18  5:38 PM  Result Value Ref Range   Glucose-Capillary 98 70 - 99 mg/dL   Comment 1 Notify RN    Comment 2 Document in Chart   MRSA PCR Screening     Status: None   Collection Time: 02/09/18  5:54 PM  Result Value Ref Range   MRSA by PCR NEGATIVE NEGATIVE    Comment:        The GeneXpert MRSA Assay (FDA approved for NASAL specimens  only), is one component of a comprehensive MRSA colonization surveillance program. It is not intended to diagnose MRSA infection  nor to guide or monitor treatment for MRSA infections. Performed at [facility], [place] [address], [place], [place] [address]   Troponin I     Status: None   Collection Time: 02/09/18  9:47 PM  Result Value Ref Range   Troponin I <0.03 <0.03 ng/mL    Comment: Performed at [facility] [address], [place], [place] [address]  Glucose, capillary     Status: None   Collection Time: 02/09/18 11:56 PM  Result Value Ref Range   Glucose-Capillary 88 70 - 99 mg/dL  Troponin I     Status: None   Collection Time: 02/10/18  4:00 AM  Result Value Ref Range   Troponin I <0.03 <0.03 ng/mL    Comment: Performed at [facility] [address], [place], [place] [address]  Heparin level (unfractionated)     Status: Abnormal   Collection Time: 02/10/18  4:00 AM  Result Value Ref Range   Heparin Unfractionated >2.20 (H) 0.30 - 0.70 IU/mL    Comment: RESULTS CONFIRMED BY MANUAL DILUTION Performed at [facility], [place] [address], [place], [place] [address]   APTT     Status: Abnormal   Collection Time: 02/10/18  4:00 AM  Result Value Ref Range   aPTT 66 (H) 24 - 36 seconds    Comment:        IF BASELINE aPTT IS ELEVATED, SUGGEST PATIENT RISK ASSESSMENT BE USED TO DETERMINE APPROPRIATE ANTICOAGULANT THERAPY. Performed at [facility], [place] [address], [place], [place] [address]   Basic metabolic panel     Status: Abnormal   Collection Time: 02/10/18  4:00 AM  Result Value Ref Range   Sodium 138 135 - 145 mmol/L   Potassium 3.7 3.5 - 5.1 mmol/L   Chloride 105 98 - 111 mmol/L   CO2 23 22 - 32 mmol/L   Glucose, Bld 111 (H) 70 - 99 mg/dL   BUN 38 (H) 8 - 23 mg/dL   Creatinine, Ser 4.22 (H) 0.44 - 1.00 mg/dL   Calcium 9.0 8.9 - 10.3 mg/dL   GFR calc non Af Amer 10 (L) >60 mL/min   GFR calc Af Amer 12 (L) >60 mL/min    Comment: (NOTE) The eGFR has been calculated using the CKD EPI equation. This calculation has not been validated in all clinical  situations. eGFR's persistently <60 mL/min signify possible Chronic Kidney Disease.    Anion gap 10 5 - 15    Comment: Performed at [facility] [address], [place], [place] [address]  CBC     Status: Abnormal   Collection Time: 02/10/18  4:00 AM  Result Value Ref Range   WBC 4.5 4.0 - 10.5 K/uL   RBC 3.80 (L) 3.87 - 5.11 MIL/uL   Hemoglobin 10.0 (L) 12.0 - 15.0 g/dL   HCT 29.8 (L) 36.0 - 46.0 %   MCV 78.4 78.0 - 100.0 fL   MCH 26.3 26.0 - 34.0 pg   MCHC 33.6 30.0 - 36.0 g/dL   RDW 13.3 11.5 - 15.5 %   Platelets 187 150 - 400 K/uL    Comment: Performed at [facility], [place] [address] [place], [place] [address]  Glucose, capillary     Status: Abnormal   Collection Time: 02/10/18  7:53 AM  Result Value Ref Range  Glucose-Capillary 106 (H) 70 - 99 mg/dL    Dg Chest 2 View  Result Date: 02/10/2018 CLINICAL DATA:  Shortness of breath. EXAM: CHEST - 2 VIEW COMPARISON:  Radiographs of January 11, 2018. FINDINGS: Stable cardiomegaly. Atherosclerosis of thoracic aorta is noted. No pneumothorax or pleural effusion is noted. No acute pulmonary disease is noted. Bony thorax is unremarkable. IMPRESSION: No active cardiopulmonary disease. Aortic Atherosclerosis (ICD10-I70.0). Electronically Signed   By: Marijo Conception, M.D.   On: 02/10/2018 09:59    ROS Blood pressure 120/74, pulse (!) 58, temperature (!) 97.5 F (36.4 C), temperature source Oral, resp. rate 12, height _0  (1.753 m), weight 56.1 kg, SpO2 100 %. Physical Exam Physical Examination: General appearance - chronically ill appearing and thin, pale Mental status - alert, oriented to person, place, and time, depressed mood Eyes - pupils equal and reactive, extraocular eye movements intact, funduscopic exam normal, discs flat and sharp Mouth - mucous membranes moist, pharynx normal without lesions Neck - adenopathy noted PCL Lymphatics - posterior cervical nodes, shotty L ax adenopathy Chest - decreased air entry  noted bilat Heart - S1 and S2 normal, systolic murmur Gr/6 at apex Abdomen - soft, nontender, nondistended, no masses or organomegaly Extremities - No DP pulses, bilat fem bruits Skin - abdm striae, trophic changes in feet  Assessment/Plan: 1 CAD for CABG 2 MV dz ? Rheumatic.  For MVR 3 Hypertension: not an issue, on meds 4. Anemia will eval 5. Metabolic Bone Disease: check phos, PTH 6 CKD 4-5 chances of dialysis high post op.  Will need PC and eventually perm access. . Vol ok.  Will save arm 7DM controlled 8 PVD symptomatic 9 Depression P Plan PC, check PTH, Fe, start esa as needed  [place] Area 02/10/2018, 11:41 AM

## 2018-02-10 NOTE — Progress Notes (Signed)
  Echocardiogram 2D Echocardiogram has been performed.  Darlina Sicilian M 02/10/2018, 12:39 PM

## 2018-02-10 NOTE — Consult Note (Addendum)
[address]       [place] [address]             [phone]    Jocelyn Hill Medical Record #[id number] Date of Birth: 1950-10-12  Referring:  Larey Dresser, MD Primary Care: [name], MD Primary Cardiologist:  Jerline Pain, MD  Primary Nephrologist Herminio Heads, MD  Chief Complaint:   Chest pain and shortness of breath   History of Present Illness:     Jocelyn Hill is a 67 year old female patient with a past medical history significant for mitral regurgitation, chronic diastolic heart failure, hypothyroidism, coronary artery disease status post myocardial infarction, hypertension, diabetes mellitus type 2, chronic kidney disease stage IV-V, and history of stroke who has had numerous admissions for heart failure towards the end of 2018.  An echocardiogram was performed September 2018 which showed an estimated ejection fraction of 60 to 65% with severe mitral valve regurgitation.  There was mild to moderate tricuspid valve regurgitation.  She was also diagnosed with atrial fibrillation at this time and started on anticoagulation.  She was admitted again the following month with respiratory failure from [facility].  She was not on a diuretic at this time and had gained 16 pounds at her skilled nursing facility.  She was treated medically with diuretics and discharged.  Dr. Roxy Manns saw the patient at this time and did not feel that she was a mitral valve repair/replacement candidate at that time.  She slowly recovered and has been followed carefully by Dr. Aundra Dubin in the Advanced Heart Failure clinic ever since.  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  Cardiac catheterization was performed in January 2019 showing 90% ostial left circumflex stenosis and proximal LAD  stenosis of at least 60%.  She improved considerably and had been scheduled for an MVR/CABG/MAZE with Dr. Roxy Manns last month but her surgery was postponed because she developed community acquired pneumonia.  She had tentatively been rescheduled for surgery later this month.  She returned yesterday to the heart failure clinic for follow-up.   Over the past week her chest pain has become much more frequent occurring 1-2 times per day.  She also has associated shortness of breath and radiation up to her bilateral jaw and down her right arm.  Symptoms usually resolve with 1-2 sublingual nitro tabs.  Pain can occur at rest or with activity.  She did have some dizziness at rest 2 days ago.  She was admitted to [facility] and placed on a nitroglycerin drip.   She is currently on Heparin gtt and Nitro gtt without any chest pain. Of note, she recently had dental surgery with Dr. Enrique Sack and is still having a significant amount of mouth pain. This may need re-evaluated before surgery.    Current Activity/ Functional Status: Patient was independent with mobility/ambulation, transfers, ADL's, IADL's.   Zubrod Score: At the time of surgery this patient's most appropriate activity status/level should be described as: []     0    Normal activity, no symptoms [x]     1    Restricted in physical strenuous activity but ambulatory, able to do out light work []     2    Ambulatory and capable of self care, unable to do work activities, up and about  more than 50%  Of the time                            []     3    Only limited self care, in bed greater than 50% of waking hours []     4    Completely disabled, no self care, confined to bed or chair []     5    Moribund  Past Medical History:  Diagnosis Date  . Anemia    low iron  . Anxiety   . Arthritis   . Bronchitis   . CHF (congestive heart failure) ([place])   . CKD (chronic kidney disease) stage 4, GFR 15-29 ml/min (HCC) 03/16/2017  . Coronary  artery disease   . Depression   . Diabetes mellitus without complication ([place])   . Diet-controlled diabetes mellitus ([place])   . Ganglion cyst    left wrist  . GERD (gastroesophageal reflux disease)   . Headache    chronic headaches  . Heart failure ([place])   . Hyperlipidemia   . Hypertension   . Hypothyroidism   . Myocardial infarction ([place])    3x last one 2008  . PAF (paroxysmal atrial fibrillation) ([place])   . Pneumonia    x 2  . Stroke [facility])    no lasting residual - ? 2014  . Umbilical hernia     Past Surgical History:  Procedure Laterality Date  . ABDOMINAL HYSTERECTOMY     PARTIALS  . CARDIAC CATHETERIZATION    . CESAREAN SECTION    . IR NEPHRO TUBE REMOV/FL  04/02/2017  . IR NEPHROSTOGRAM RIGHT THRU EXISTING ACCESS  04/02/2017  . IR NEPHROSTOMY PLACEMENT RIGHT  03/27/2017  . IR THORACENTESIS ASP PLEURAL SPACE W/IMG GUIDE  03/24/2017  . MULTIPLE EXTRACTIONS WITH ALVEOLOPLASTY N/A 11/05/2017   Procedure: Extraction of tooth #'s 4,5,7,8,9,10,12,14 and 29 with alveoloplasty and gross debridement of remaining teeth;  Surgeon: [name], DDS;  Location: [facility];  Service: Oral Surgery;  Laterality: N/A;  . RIGHT/LEFT HEART CATH AND CORONARY ANGIOGRAPHY N/A 07/03/2017   Procedure: RIGHT/LEFT HEART CATH AND CORONARY ANGIOGRAPHY;  Surgeon: Larey Dresser, MD;  Location: [facility] CV LAB;  Service: Cardiovascular;  Laterality: N/A;  . TEE WITHOUT CARDIOVERSION N/A 10/08/2017   Procedure: TRANSESOPHAGEAL ECHOCARDIOGRAM (TEE);  Surgeon: Larey Dresser, MD;  Location: [facility] ENDOSCOPY;  Service: Cardiovascular;  Laterality: N/A;  . TONSILLECTOMY    . TUBAL LIGATION      Social History   Tobacco Use  Smoking Status Former Smoker  . Types: Cigarettes  Smokeless Tobacco Never Used    Social History   Substance and Sexual Activity  Alcohol Use No     Allergies  Allergen Reactions  . Ace Inhibitors Anaphylaxis and Swelling  . Motrin Ib [Ibuprofen] Anaphylaxis    Current  Facility-Administered Medications  Medication Dose Route Frequency Provider Last Rate Last Dose  . 0.9 %  sodium chloride infusion  250 mL Intravenous PRN Georgiana Shore, NP      . acetaminophen (TYLENOL) tablet 650 mg  650 mg Oral Q6H PRN Georgiana Shore, NP   650 mg at 02/10/18 0032  . albuterol (PROVENTIL) (2.5 MG/3ML) 0.083% nebulizer solution 2.5 mg  2.5 mg Inhalation Q6H PRN Georgiana Shore, NP      . ALPRAZolam Duanne Moron) tablet 0.25 mg  0.25 mg Oral QHS PRN Georgiana Shore, NP   0.25 mg at 02/09/18  2132  . amLODipine (NORVASC) tablet 10 mg  10 mg Oral Daily Georgiana Shore, NP      . aspirin EC tablet 81 mg  81 mg Oral Daily Georgiana Shore, NP      . atorvastatin (LIPITOR) tablet 40 mg  40 mg Oral Daily Georgiana Shore, NP      . B-complex with vitamin C tablet 1 tablet  1 tablet Oral Daily Georgiana Shore, NP      . buPROPion [facility]) tablet 100 mg  100 mg Oral TID AC Georgiana Shore, NP   100 mg at 02/10/18 0817  . busPIRone (BUSPAR) tablet 15 mg  15 mg Oral BID Georgiana Shore, NP   15 mg at 02/10/18 4656  . carvedilol (COREG) tablet 25 mg  25 mg Oral BID PC Georgiana Shore, NP   25 mg at 02/10/18 0819  . cholecalciferol (VITAMIN D) tablet 1,000 Units  1,000 Units Oral Daily Georgiana Shore, NP      . DULoxetine (CYMBALTA) DR capsule 30 mg  30 mg Oral Daily Georgiana Shore, NP      . furosemide (LASIX) tablet 120 mg  120 mg Oral BID Georgiana Shore, NP   120 mg at 02/10/18 0804  . gabapentin (NEURONTIN) capsule 300 mg  300 mg Oral QHS Georgiana Shore, NP   300 mg at 02/09/18 2132  . heparin ADULT infusion 100 units/mL (25000 units/279mL sodium chloride 0.45%)  850 Units/hr Intravenous Continuous Einar Grad, RPH 8.5 mL/hr at 02/10/18 0727 850 Units/hr at 02/10/18 0727  . hydrALAZINE (APRESOLINE) tablet 25 mg  25 mg Oral Q8H Larey Dresser, MD      . insulin aspart (novoLOG) injection 0-9 Units  0-9 Units Subcutaneous TID WC Shirley Friar, PA-C      . [START ON  02/11/2018] levothyroxine (SYNTHROID, LEVOTHROID) tablet 112 mcg  112 mcg Oral QAC breakfast Larey Dresser, MD      . montelukast (SINGULAIR) tablet 10 mg  10 mg Oral Daily Georgiana Shore, NP   10 mg at 02/10/18 0805  . morphine 2 MG/ML injection 1-2 mg  1-2 mg Intravenous Q4H PRN Larey Dresser, MD   2 mg at 02/10/18 0805  . nitroGLYCERIN 50 mg in dextrose 5 % 250 mL (0.2 mg/mL) infusion  0-200 mcg/min Intravenous Titrated Georgiana Shore, NP 10.5 mL/hr at 02/10/18 0500 35 mcg/min at 02/10/18 0500  . ondansetron (ZOFRAN) injection 4 mg  4 mg Intravenous Q6H PRN Georgiana Shore, NP      . ondansetron [facility]) tablet 4 mg  4 mg Oral Q8H PRN Georgiana Shore, NP      . oxyCODONE (Oxy IR/ROXICODONE) immediate release tablet 5 mg  5 mg Oral Q6H PRN Sueanne Margarita, MD   5 mg at 02/10/18 0335  . pantoprazole (PROTONIX) EC tablet 40 mg  40 mg Oral Daily Georgiana Shore, NP      . potassium chloride SA (K-DUR,KLOR-CON) CR tablet 40 mEq  40 mEq Oral Daily Larey Dresser, MD   40 mEq at 02/10/18 0818  . ranolazine (RANEXA) 12 hr tablet 500 mg  500 mg Oral BID Georgiana Shore, NP   500 mg at 02/10/18 0818  . sodium chloride flush (NS) 0.9 % injection 3 mL  3 mL Intravenous Q12H Georgiana Shore, NP   3 mL at 02/09/18 2133  . sodium chloride flush (NS) 0.9 % injection 3  mL  3 mL Intravenous PRN Georgiana Shore, NP        Medications Prior to Admission  Medication Sig Dispense Refill Last Dose  . acetaminophen (TYLENOL) 325 MG tablet Take 2 tablets (650 mg total) by mouth every 6 (six) hours as needed for mild pain, fever or headache. 30 tablet 0 unk at prn  . albuterol (PROVENTIL HFA;VENTOLIN HFA) 108 (90 Base) MCG/ACT inhaler Inhale 2 puffs into the lungs every 6 (six) hours as needed for wheezing or shortness of breath.   02/09/2018 at Unknown time  . ALPRAZolam (XANAX) 0.25 MG tablet Take 1 tablet (0.25 mg total) by mouth at bedtime as needed for anxiety. 20 tablet 0 02/08/2018 at Unknown time  .  amLODipine (NORVASC) 10 MG tablet Take 1 tablet (10 mg total) by mouth daily. 30 tablet 0 02/09/2018 at 1230  . aspirin EC 81 MG EC tablet Take 1 tablet (81 mg total) by mouth daily. 30 tablet 0 02/09/2018 at 1230  . atorvastatin (LIPITOR) 40 MG tablet Take 1 tablet (40 mg total) by mouth daily. 30 tablet 0 02/09/2018 at Unknown time  . b complex vitamins tablet Take 1 tablet by mouth daily.   02/09/2018 at Unknown time  . bisacodyl (DULCOLAX) 5 MG EC tablet Take 5 mg by mouth daily as needed for mild constipation or moderate constipation.   02/09/2018 at Unknown time  . buPROPion (WELLBUTRIN) 100 MG tablet Take 100 mg by mouth 3 (three) times daily.   02/09/2018 at Unknown time  . busPIRone (BUSPAR) 15 MG tablet Take 1 tablet (15 mg total) by mouth 2 (two) times daily. 60 tablet 0 02/09/2018 at Unknown time  . calcium carbonate (TUMS EX) 750 MG chewable tablet Chew 2 tablets by mouth as needed for heartburn.   02/08/2018 at Unknown time  . carvedilol (COREG) 25 MG tablet Take 25 mg by mouth 2 (two) times daily with a meal.   02/09/2018 at 1230  . chlorhexidine (PERIDEX) 0.12 % solution Rinse with 15 mls twice daily for 30 seconds. Use after breakfast and at bedtime. Spit out excess. Do not swallow. (Patient taking differently: 15 mLs by Mouth Rinse route 2 (two) times daily as needed (for mouth discomfort). Rinse with 15 mls twice daily for 30 seconds. Use after breakfast and at bedtime. Spit out excess. Do not swallow.) 960 mL prn Past Week at Unknown time  . cholecalciferol (VITAMIN D) 1000 units tablet Take 1,000 Units by mouth daily.   02/09/2018 at Unknown time  . diphenhydrAMINE (BENADRYL) 25 MG tablet Take 25 mg by mouth as needed for allergies.   02/08/2018 at Unknown time  . doxazosin (CARDURA) 8 MG tablet Take 8 mg by mouth daily. Takes 1 tablet in the AM and 0.5 Tablet in the PM.   02/09/2018 at Unknown time  . DULoxetine (CYMBALTA) 30 MG capsule Take 1 capsule (30 mg total) by mouth daily. 30 capsule 0  02/09/2018 at Unknown time  . ELIQUIS 2.5 MG TABS tablet Take 2.5 mg by mouth 2 (two) times daily.  10 02/09/2018 at 1230  . furosemide (LASIX) 80 MG tablet Take 1.5 tablets (120 mg total) by mouth 2 (two) times daily. 270 tablet 3 02/09/2018 at 1230  . gabapentin (NEURONTIN) 300 MG capsule Take 300 mg by mouth at bedtime.  11 02/08/2018 at Unknown time  . hydrALAZINE (APRESOLINE) 100 MG tablet Take 100 mg by mouth 2 (two) times daily.   02/09/2018 at 1230  . isosorbide mononitrate (IMDUR)  120 MG 24 hr tablet Take 1 tablet (120 mg total) by mouth daily. 30 tablet 0 02/09/2018 at 1230  . levothyroxine (SYNTHROID, LEVOTHROID) 112 MCG tablet Take 1 tablet (112 mcg total) by mouth daily before breakfast. 30 tablet 0 02/09/2018 at 1230  . Liniments (BEN GAY EX) Apply 1 application topically as needed (back pain).   Past Week at Unknown time  . montelukast (SINGULAIR) 10 MG tablet Take 1 tablet (10 mg total) by mouth daily. (Patient taking differently: Take 10 mg by mouth at bedtime. ) 30 tablet 0 02/08/2018 at Unknown time  . nitroGLYCERIN (NITROSTAT) 0.4 MG SL tablet Place 1 tablet (0.4 mg total) under the tongue every 5 (five) minutes as needed for chest pain. 30 tablet 0 02/09/2018 at 1530  . Nutritional Supplements (CARNATION BREAKFAST ESSENTIALS PO) Take 237 mLs by mouth 2 (two) times daily.   Past Week at Unknown time  . ondansetron (ZOFRAN) 4 MG tablet Take 1 tablet (4 mg total) by mouth every 8 (eight) hours as needed for nausea or vomiting. 30 tablet 0 01/26/2018  . oxyCODONE (OXY IR/ROXICODONE) 5 MG immediate release tablet Take 1 tablet (5 mg total) by mouth every 8 (eight) hours as needed (pain). 6 tablet 0 02/08/2018 at Unknown time  . oxyCODONE-acetaminophen (PERCOCET) 5-325 MG tablet Take one or two tablets by mouth every 6 hours as needed for pain. 32 tablet 0 02/07/2018  . pantoprazole (PROTONIX) 40 MG tablet Take 1 tablet (40 mg total) by mouth 2 (two) times daily before a meal. (Patient taking  differently: Take 40 mg by mouth daily before breakfast. ) 60 tablet 0 02/09/2018 at Unknown time  . potassium chloride SA (K-DUR,KLOR-CON) 20 MEQ tablet Take 2 tablets (40 mEq total) by mouth daily. 60 tablet 3 02/09/2018 at Unknown time  . sodium-potassium bicarbonate (ALKA-SELTZER GOLD) TBEF dissolvable tablet Take 4 tablets by mouth daily as needed (heartburn).   02/08/2018 at Unknown time    Family History  Problem Relation Age of Onset  . Hypertension Mother      Review of Systems:   ROS Pertinent items are noted in HPI.     Cardiac Review of Systems: Y or  [    ]= no  Chest Pain [ yes   ]  Resting SOB [ yes  ] Exertional SOB  [ yes ] Orthopnea [ no ]   Pedal Edema [ no  ]    Palpitations [  ] Syncope  [  ]   Presyncope [   ]  General Review of Systems: [Y] = yes [  ]=no Constitional: recent weight change [  ]; anorexia [ no ]; fatigue [ yes  ]; nausea [  ]; night sweats [  ]; fever [ no ]; or chills [  ]                                                               Dental: Last Dentist visit:   Eye : blurred vision [  ]; diplopia [   ]; vision changes [  ];  Amaurosis fugax[  ]; Resp: cough [no  ];  wheezing[ no ];  hemoptysis[  ]; shortness of breath[ yes ]; paroxysmal nocturnal dyspnea[  ]; dyspnea on exertion[ yes ]; or orthopnea[  ];  GI:  gallstones[  ], vomiting[  ];  dysphagia[no  ]; melena[  ];  hematochezia [  ]; heartburn[  ];   Hx of  Colonoscopy[  ]; GU: kidney stones [  ]; hematuria[  ];   dysuria [  ];  nocturia[  ];  history of     obstruction [  ]; urinary frequency [  ]             Skin: rash, swelling[  ];, hair loss[  ];  peripheral edema[ no ];  or itching[  ]; Musculosketetal: myalgias[  ];  joint swelling[  ];  joint erythema[  ];  joint pain[  ];  back pain[  ];  Heme/Lymph: bruising[  ];  bleeding[  ];  anemia[  ];  Neuro: TIA[  ];  headaches[  ];  stroke[ yes ];  vertigo[  ];  seizures[  ];   paresthesias[  ];  difficulty walking[  ];  Psych:depression[ yes  ]; anxiety[ yes ];  Endocrine: diabetes[ yes  ];  thyroid dysfunction[ yes  ];              Physical Exam: BP (!) 147/86 (BP Location: Right Arm)   Pulse 61   Temp 97.8 F (36.6 C) (Oral)   Resp 16   Ht 5\' 9"  (1.753 m)   Wt 56.1 kg   SpO2 100%   BMI 18.26 kg/m    General appearance: alert, cooperative and no distress Resp: clear to auscultation bilaterally Cardio: regular rate and rhythm, S1, S2 normal, no murmur, click, rub or gallop and with frequent PVCs GI: soft, non-tender; bowel sounds normal; no masses,  no organomegaly Extremities: extremities normal, atraumatic, no cyanosis or edema Neurologic: Grossly normal  Diagnostic Studies & Laboratory data:  07/03/2017 Cardiac Catheterization  1. Low filling pressure and preserved cardiac output.  2. 90% ostial LCx stenosis.  3. Napkin ring-like stenosis in the proximal LAD, at least 60% stenosis.   50 cc contrast used.  Patient was hydrated before, during and will be hydrated post case.    I will decrease her Lasix at home to 120 mg bid, will not start back on Lasix until Sunday.  She will need BMET early next week.   Patient will have MV surgery.  She will need CABG with grafting of LCx +/- LAD.    10/08/2017 Echocardiogram  Result status: Final result                              *[place]*                   *[facility]*                         [address]                        [place],  [address]                            [phone]  ------------------------------------------------------------------- Transesophageal Echocardiography  Patient:    Hill, Jocelyn F MR #:       [id number] Study Date: 10/08/2017 Gender:     F Age:        67 Height:     172.7 cm Weight:     60 .3  kg BSA:        1.7 m^2 Pt. Status: Room:   ADMITTING    [name], M.D.  ATTENDING    [name], M.D.  ORDERING     [name], M.D.  PERFORMING   [name], M.D.  REFERRING     [name], M.D.  SONOGRAPHER  Merrie Roof, RDCS  cc:  ------------------------------------------------------------------- LV EF: 55% -   60%  ------------------------------------------------------------------- Indications:      Mitral regurgitation 424.0.  ------------------------------------------------------------------- History:   Risk factors:  Hypertension. Diabetes mellitus. Dyslipidemia.  ------------------------------------------------------------------- Study Conclusions  - Left ventricle: The cavity size was normal. Wall thickness was   increased in a pattern of mild LVH. Systolic function was normal.   The estimated ejection fraction was in the range of 55% to 60%.   Wall motion was normal; there were no regional wall motion   abnormalities. - Aortic valve: There was no stenosis. There was trivial   regurgitation. - Aorta: Grade 4 plaque in the descending thoracic aorta and arch. - Mitral valve: The mitral valve was thickened towards the leaflet   tips, morphology suggests rheumatic heart disease. There was no   significant stenosis, mean gradient 4 mmHg. There was severe   mitral regurgitation with ERO 0.42 cm^2. There was systolic flow   reversal in 1 out of 2 pulmonary veins interrogated. - Left atrium: The atrium was mildly to moderately dilated. No   evidence of thrombus in the atrial cavity or appendage. - Right ventricle: The cavity size was normal. Systolic function   was normal. - Right atrium: No evidence of thrombus in the atrial cavity or   appendage. - Atrial septum: There was a small PFO by color doppler but bubble   study was negative. - Tricuspid valve: Trivial TR, peak RV-RA gradient 37 mmHg. 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         Recent Radiology Findings:   No results found.   I have independently reviewed the above radiologic studies and  discussed with the patient   Recent Lab Findings: Lab Results  Component Value Date   WBC 4.5 02/10/2018   HGB 10.0 (L) 02/10/2018   HCT 29.8 (L) 02/10/2018   PLT 187 02/10/2018   GLUCOSE 111 (H) 02/10/2018   CHOL 159 06/18/2017   TRIG 146 06/18/2017   HDL 66 06/18/2017   LDLCALC 64 06/18/2017   ALT 15 01/11/2018   AST 20 01/11/2018   NA 138 02/10/2018   K 3.7 02/10/2018   CL 105 02/10/2018   CREATININE 4.22 (H) 02/10/2018   BUN 38 (H) 02/10/2018   CO2 23 02/10/2018   TSH 2.884 10/26/2017   INR 1.36 02/09/2018   HGBA1C 6.9 (H) 12/21/2017      Assessment / Plan:      1. CAD-most recent cath was 06/2017 which showed 90% ostial left circumflex stenosis and proximal LAD stenosis of at least 60%. Did have an increase in frequency of symptoms over the last week. Currently chest pain free and on heparin gtt and nitro gtt. Plan for CABG/MVR/possible MAZE next week with Dr. Roxy Manns. Continue medical  Management per Cardiology until timing of surgery. 2. 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  Volume status on exam appears euvolemic.  She is currently on 120 mg of Lasix twice daily.  She is getting a  follow-up echocardiogram today. 3.  Hypertension-appears to be well controlled on her current medication regimen. 4.  Paroxysmal atrial fibrillation-normal sinus rhythm today in the 70s.  She was on Eliquis at home however is currently being held and on heparin gtt.  She had some junctional bradycardia on amiodarone so this is being held. Possibly MAZE intraoperatively.  5. CKD stage IV-creatinine 4.22 today, trending down. Electrolytes okay. Renal has been consulted for possible per-op HD if needed.  6. 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 Plan for mitral valve repair/replacement as soon as OR is available.  7. Dental caries/retained dental root-recent surgery with continued dental pain. May need Dr. Enrique Sack to  re-evaluate.  8. Depression/Anxiety-continue current home medication regimen  9. Recent pneumonia-completed a full course of antibiotics per the patient. WBC is normal. No fevers.   Plan: Procedure reviewed with the patient. All questions were answered to her satisfaction. She would like to speak with someone later today when her family arrives about timing of surgery.   I  spent 30 minutes counseling the patient face to face.   Nicholes Rough, PA-C  02/10/2018 9:16 AM  I have seen and examined the patient and agree with the assessment and plan as outlined above by Nicholes Rough, PA-C.  Ms. Kiel is well-known to me from her previous hospitalizations and numerous office visits.  We have tentatively plan for surgery later this month but she has now been hospitalized with accelerating symptoms of unstable angina pectoris.  She reports that she feels much better today and she has not had any further symptoms of chest pain or chest tightness since hospital admission.  We will continue to follow over the next couple of days and hopefully move her scheduled surgery up to sometime next week if possible.   I spent in excess of 30 minutes during the conduct of this initial hospital encounter and >50% of this time involved direct face-to-face encounter with the patient for counseling and/or coordination of their care.         Rexene Alberts, MD 02/10/2018 4:12 PM

## 2018-02-10 NOTE — H&P (Deleted)
[address]       [place] [address]             [phone]        Avaiyah F Smeal [place]illan Medical Record #[id number] Date of Birth: 25-Nov-1950  Referring: [name], MD Primary Care: [name], MD Primary Cardiologist:No primary care provider on file.  Chief Complaint:   No chief complaint on file.   History of Present Illness:     Erionna Strum is a 67 year old female patient with a past medical history significant for mitral regurgitation, chronic diastolic heart failure, hypothyroidism, coronary artery disease status post myocardial infarction, hypertension, diabetes mellitus type 2, chronic kidney disease stage IV-V, and history of stroke who has had numerous admissions for heart failure towards the end of 2018.  An echocardiogram was performed September 2018 which showed an estimated ejection fraction of 60 to 65% with severe mitral valve regurgitation.  There was mild to moderate tricuspid valve regurgitation.  She was also diagnosed with atrial fibrillation at this time and started on anticoagulation.  She was admitted again the following month with respiratory failure from [facility].  She was not on a diuretic at this time and had gained 16 pounds at her skilled nursing facility.  She was treated medically with diuretics and discharged.  Dr. Roxy Manns saw the patient at this time and did not feel that she was a mitral valve repair/replacement candidate.  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  Cardiac catheterization was performed in January 2019 showing 90% ostial left circumflex stenosis and proximal LAD stenosis of at least 60%.  She returned yesterday to the heart failure clinic for follow-up.  She had been scheduled for an MVR/CABG/MAZE with Dr. Roxy Manns but due to  pneumonia her surgery was postponed.  Over the past week her chest pain has become much more frequent occurring 1-2 times per day.  She also has associated shortness of breath and radiation up to her bilateral jaw and down her right arm.  Symptoms usually resolve with 1-2 sublingual nitro tabs.  Pain can occur at rest or with activity.  She did have some dizziness at rest 2 days ago.  She was admitted to [facility] and placed on a nitroglycerin drip.  She was originally scheduled for an MVR, CABG, possible maze scheduled for 02/25/2018.  However, given her worsening symptoms she may need surgery this admission. She is currently on Heparin gtt and Nitro gtt without any chest pain. Of note, she recently had dental surgery with Dr. Enrique Sack and is still having a significant amount of mouth pain. This may need re-evaluated before surgery.    Current Activity/ Functional Status: Patient was independent with mobility/ambulation, transfers, ADL's, IADL's.   Zubrod Score: At the time of surgery this patient's most appropriate activity status/level should be described as: []     0    Normal activity, no symptoms [x]     1    Restricted in physical strenuous activity but ambulatory, able to do out light work []     2    Ambulatory and capable of self care, unable to do work activities, up and about                 more than 50%  Of the time                            []   3    Only limited self care, in bed greater than 50% of waking hours []     4    Completely disabled, no self care, confined to bed or chair []     5    Moribund  Past Medical History:  Diagnosis Date  . Anemia    low iron  . Anxiety   . Arthritis   . Bronchitis   . CHF (congestive heart failure) ([place])   . CKD (chronic kidney disease) stage 4, GFR 15-29 ml/min (HCC) 03/16/2017  . Coronary artery disease   . Depression   . Diabetes mellitus without complication ([place])   . Diet-controlled diabetes mellitus ([place])   . Ganglion cyst     left wrist  . GERD (gastroesophageal reflux disease)   . Headache    chronic headaches  . Heart failure ([place])   . Hyperlipidemia   . Hypertension   . Hypothyroidism   . Myocardial infarction ([place])    3x last one 2008  . PAF (paroxysmal atrial fibrillation) ([place])   . Pneumonia    x 2  . Stroke [facility])    no lasting residual - ? 2014  . Umbilical hernia     Past Surgical History:  Procedure Laterality Date  . ABDOMINAL HYSTERECTOMY     PARTIALS  . CARDIAC CATHETERIZATION    . CESAREAN SECTION    . IR NEPHRO TUBE REMOV/FL  04/02/2017  . IR NEPHROSTOGRAM RIGHT THRU EXISTING ACCESS  04/02/2017  . IR NEPHROSTOMY PLACEMENT RIGHT  03/27/2017  . IR THORACENTESIS ASP PLEURAL SPACE W/IMG GUIDE  03/24/2017  . MULTIPLE EXTRACTIONS WITH ALVEOLOPLASTY N/A 11/05/2017   Procedure: Extraction of tooth #'s 4,5,7,8,9,10,12,14 and 29 with alveoloplasty and gross debridement of remaining teeth;  Surgeon: [name], DDS;  Location: [facility];  Service: Oral Surgery;  Laterality: N/A;  . RIGHT/LEFT HEART CATH AND CORONARY ANGIOGRAPHY N/A 07/03/2017   Procedure: RIGHT/LEFT HEART CATH AND CORONARY ANGIOGRAPHY;  Surgeon: Larey Dresser, MD;  Location: [place] CV LAB;  Service: Cardiovascular;  Laterality: N/A;  . TEE WITHOUT CARDIOVERSION N/A 10/08/2017   Procedure: TRANSESOPHAGEAL ECHOCARDIOGRAM (TEE);  Surgeon: Larey Dresser, MD;  Location: [facility] ENDOSCOPY;  Service: Cardiovascular;  Laterality: N/A;  . TONSILLECTOMY    . TUBAL LIGATION      Social History   Tobacco Use  Smoking Status Former Smoker  . Types: Cigarettes  Smokeless Tobacco Never Used    Social History   Substance and Sexual Activity  Alcohol Use No     Allergies  Allergen Reactions  . Ace Inhibitors Anaphylaxis and Swelling  . Motrin Ib [Ibuprofen] Anaphylaxis    Current Facility-Administered Medications  Medication Dose Route Frequency Provider Last Rate Last Dose  . 0.9 %  sodium chloride infusion  250 mL  Intravenous PRN Georgiana Shore, NP      . acetaminophen (TYLENOL) tablet 650 mg  650 mg Oral Q6H PRN Georgiana Shore, NP   650 mg at 02/10/18 0032  . albuterol (PROVENTIL) (2.5 MG/3ML) 0.083% nebulizer solution 2.5 mg  2.5 mg Inhalation Q6H PRN Georgiana Shore, NP      . ALPRAZolam Duanne Moron) tablet 0.25 mg  0.25 mg Oral QHS PRN Georgiana Shore, NP   0.25 mg at 02/09/18 2132  . amLODipine (NORVASC) tablet 10 mg  10 mg Oral Daily Georgiana Shore, NP      . aspirin EC tablet 81 mg  81 mg Oral Daily Georgiana Shore, NP      .  atorvastatin (LIPITOR) tablet 40 mg  40 mg Oral Daily Georgiana Shore, NP      . B-complex with vitamin C tablet 1 tablet  1 tablet Oral Daily Georgiana Shore, NP      . buPROPion [facility]) tablet 100 mg  100 mg Oral TID AC Georgiana Shore, NP   100 mg at 02/10/18 0817  . busPIRone (BUSPAR) tablet 15 mg  15 mg Oral BID Georgiana Shore, NP   15 mg at 02/10/18 1749  . carvedilol (COREG) tablet 25 mg  25 mg Oral BID PC Georgiana Shore, NP   25 mg at 02/10/18 0819  . cholecalciferol (VITAMIN D) tablet 1,000 Units  1,000 Units Oral Daily Georgiana Shore, NP      . DULoxetine (CYMBALTA) DR capsule 30 mg  30 mg Oral Daily Georgiana Shore, NP      . furosemide (LASIX) tablet 120 mg  120 mg Oral BID Georgiana Shore, NP   120 mg at 02/10/18 0804  . gabapentin (NEURONTIN) capsule 300 mg  300 mg Oral QHS Georgiana Shore, NP   300 mg at 02/09/18 2132  . heparin ADULT infusion 100 units/mL (25000 units/266mL sodium chloride 0.45%)  850 Units/hr Intravenous Continuous Einar Grad, RPH 8.5 mL/hr at 02/10/18 0727 850 Units/hr at 02/10/18 0727  . hydrALAZINE (APRESOLINE) tablet 25 mg  25 mg Oral Q8H Larey Dresser, MD      . insulin aspart (novoLOG) injection 0-9 Units  0-9 Units Subcutaneous TID WC Shirley Friar, PA-C      . [START ON 02/11/2018] levothyroxine (SYNTHROID, LEVOTHROID) tablet 112 mcg  112 mcg Oral QAC breakfast Larey Dresser, MD      . montelukast (SINGULAIR)  tablet 10 mg  10 mg Oral Daily Georgiana Shore, NP   10 mg at 02/10/18 0805  . morphine 2 MG/ML injection 1-2 mg  1-2 mg Intravenous Q4H PRN Larey Dresser, MD   2 mg at 02/10/18 0805  . nitroGLYCERIN 50 mg in dextrose 5 % 250 mL (0.2 mg/mL) infusion  0-200 mcg/min Intravenous Titrated Georgiana Shore, NP 10.5 mL/hr at 02/10/18 0500 35 mcg/min at 02/10/18 0500  . ondansetron (ZOFRAN) injection 4 mg  4 mg Intravenous Q6H PRN Georgiana Shore, NP      . ondansetron [facility]) tablet 4 mg  4 mg Oral Q8H PRN Georgiana Shore, NP      . oxyCODONE (Oxy IR/ROXICODONE) immediate release tablet 5 mg  5 mg Oral Q6H PRN Sueanne Margarita, MD   5 mg at 02/10/18 0335  . pantoprazole (PROTONIX) EC tablet 40 mg  40 mg Oral Daily Georgiana Shore, NP      . potassium chloride SA (K-DUR,KLOR-CON) CR tablet 40 mEq  40 mEq Oral Daily Larey Dresser, MD   40 mEq at 02/10/18 0818  . ranolazine (RANEXA) 12 hr tablet 500 mg  500 mg Oral BID Georgiana Shore, NP   500 mg at 02/10/18 0818  . sodium chloride flush (NS) 0.9 % injection 3 mL  3 mL Intravenous Q12H Georgiana Shore, NP   3 mL at 02/09/18 2133  . sodium chloride flush (NS) 0.9 % injection 3 mL  3 mL Intravenous PRN Georgiana Shore, NP        Medications Prior to Admission  Medication Sig Dispense Refill Last Dose  . acetaminophen (TYLENOL) 325 MG tablet Take 2 tablets (650 mg total) by mouth  every 6 (six) hours as needed for mild pain, fever or headache. 30 tablet 0 unk at prn  . albuterol (PROVENTIL HFA;VENTOLIN HFA) 108 (90 Base) MCG/ACT inhaler Inhale 2 puffs into the lungs every 6 (six) hours as needed for wheezing or shortness of breath.   02/09/2018 at Unknown time  . ALPRAZolam (XANAX) 0.25 MG tablet Take 1 tablet (0.25 mg total) by mouth at bedtime as needed for anxiety. 20 tablet 0 02/08/2018 at Unknown time  . amLODipine (NORVASC) 10 MG tablet Take 1 tablet (10 mg total) by mouth daily. 30 tablet 0 02/09/2018 at 1230  . aspirin EC 81 MG EC tablet Take 1  tablet (81 mg total) by mouth daily. 30 tablet 0 02/09/2018 at 1230  . atorvastatin (LIPITOR) 40 MG tablet Take 1 tablet (40 mg total) by mouth daily. 30 tablet 0 02/09/2018 at Unknown time  . b complex vitamins tablet Take 1 tablet by mouth daily.   02/09/2018 at Unknown time  . bisacodyl (DULCOLAX) 5 MG EC tablet Take 5 mg by mouth daily as needed for mild constipation or moderate constipation.   02/09/2018 at Unknown time  . buPROPion (WELLBUTRIN) 100 MG tablet Take 100 mg by mouth 3 (three) times daily.   02/09/2018 at Unknown time  . busPIRone (BUSPAR) 15 MG tablet Take 1 tablet (15 mg total) by mouth 2 (two) times daily. 60 tablet 0 02/09/2018 at Unknown time  . calcium carbonate (TUMS EX) 750 MG chewable tablet Chew 2 tablets by mouth as needed for heartburn.   02/08/2018 at Unknown time  . carvedilol (COREG) 25 MG tablet Take 25 mg by mouth 2 (two) times daily with a meal.   02/09/2018 at 1230  . chlorhexidine (PERIDEX) 0.12 % solution Rinse with 15 mls twice daily for 30 seconds. Use after breakfast and at bedtime. Spit out excess. Do not swallow. (Patient taking differently: 15 mLs by Mouth Rinse route 2 (two) times daily as needed (for mouth discomfort). Rinse with 15 mls twice daily for 30 seconds. Use after breakfast and at bedtime. Spit out excess. Do not swallow.) 960 mL prn Past Week at Unknown time  . cholecalciferol (VITAMIN D) 1000 units tablet Take 1,000 Units by mouth daily.   02/09/2018 at Unknown time  . diphenhydrAMINE (BENADRYL) 25 MG tablet Take 25 mg by mouth as needed for allergies.   02/08/2018 at Unknown time  . doxazosin (CARDURA) 8 MG tablet Take 8 mg by mouth daily. Takes 1 tablet in the AM and 0.5 Tablet in the PM.   02/09/2018 at Unknown time  . DULoxetine (CYMBALTA) 30 MG capsule Take 1 capsule (30 mg total) by mouth daily. 30 capsule 0 02/09/2018 at Unknown time  . ELIQUIS 2.5 MG TABS tablet Take 2.5 mg by mouth 2 (two) times daily.  10 02/09/2018 at 1230  . furosemide (LASIX)  80 MG tablet Take 1.5 tablets (120 mg total) by mouth 2 (two) times daily. 270 tablet 3 02/09/2018 at 1230  . gabapentin (NEURONTIN) 300 MG capsule Take 300 mg by mouth at bedtime.  11 02/08/2018 at Unknown time  . hydrALAZINE (APRESOLINE) 100 MG tablet Take 100 mg by mouth 2 (two) times daily.   02/09/2018 at 1230  . isosorbide mononitrate (IMDUR) 120 MG 24 hr tablet Take 1 tablet (120 mg total) by mouth daily. 30 tablet 0 02/09/2018 at 1230  . levothyroxine (SYNTHROID, LEVOTHROID) 112 MCG tablet Take 1 tablet (112 mcg total) by mouth daily before breakfast. 30 tablet 0 02/09/2018  at 1230  . Liniments (BEN GAY EX) Apply 1 application topically as needed (back pain).   Past Week at Unknown time  . montelukast (SINGULAIR) 10 MG tablet Take 1 tablet (10 mg total) by mouth daily. (Patient taking differently: Take 10 mg by mouth at bedtime. ) 30 tablet 0 02/08/2018 at Unknown time  . nitroGLYCERIN (NITROSTAT) 0.4 MG SL tablet Place 1 tablet (0.4 mg total) under the tongue every 5 (five) minutes as needed for chest pain. 30 tablet 0 02/09/2018 at 1530  . Nutritional Supplements (CARNATION BREAKFAST ESSENTIALS PO) Take 237 mLs by mouth 2 (two) times daily.   Past Week at Unknown time  . ondansetron (ZOFRAN) 4 MG tablet Take 1 tablet (4 mg total) by mouth every 8 (eight) hours as needed for nausea or vomiting. 30 tablet 0 01/26/2018  . oxyCODONE (OXY IR/ROXICODONE) 5 MG immediate release tablet Take 1 tablet (5 mg total) by mouth every 8 (eight) hours as needed (pain). 6 tablet 0 02/08/2018 at Unknown time  . oxyCODONE-acetaminophen (PERCOCET) 5-325 MG tablet Take one or two tablets by mouth every 6 hours as needed for pain. 32 tablet 0 02/07/2018  . pantoprazole (PROTONIX) 40 MG tablet Take 1 tablet (40 mg total) by mouth 2 (two) times daily before a meal. (Patient taking differently: Take 40 mg by mouth daily before breakfast. ) 60 tablet 0 02/09/2018 at Unknown time  . potassium chloride SA (K-DUR,KLOR-CON) 20 MEQ  tablet Take 2 tablets (40 mEq total) by mouth daily. 60 tablet 3 02/09/2018 at Unknown time  . sodium-potassium bicarbonate (ALKA-SELTZER GOLD) TBEF dissolvable tablet Take 4 tablets by mouth daily as needed (heartburn).   02/08/2018 at Unknown time    Family History  Problem Relation Age of Onset  . Hypertension Mother      Review of Systems:   ROS Pertinent items are noted in HPI.     Cardiac Review of Systems: Y or  [    ]= no  Chest Pain [ yes   ]  Resting SOB [ yes  ] Exertional SOB  [ yes ] Orthopnea [ no ]   Pedal Edema [ no  ]    Palpitations [  ] Syncope  [  ]   Presyncope [   ]  General Review of Systems: [Y] = yes [  ]=no Constitional: recent weight change [  ]; anorexia [ no ]; fatigue [ yes  ]; nausea [  ]; night sweats [  ]; fever [ no ]; or chills [  ]                                                               Dental: Last Dentist visit:   Eye : blurred vision [  ]; diplopia [   ]; vision changes [  ];  Amaurosis fugax[  ]; Resp: cough [no  ];  wheezing[ no ];  hemoptysis[  ]; shortness of breath[ yes ]; paroxysmal nocturnal dyspnea[  ]; dyspnea on exertion[ yes ]; or orthopnea[  ];  GI:  gallstones[  ], vomiting[  ];  dysphagia[no  ]; melena[  ];  hematochezia [  ]; heartburn[  ];   Hx of  Colonoscopy[  ]; GU: kidney stones [  ]; hematuria[  ];  dysuria [  ];  nocturia[  ];  history of     obstruction [  ]; urinary frequency [  ]             Skin: rash, swelling[  ];, hair loss[  ];  peripheral edema[ no ];  or itching[  ]; Musculosketetal: myalgias[  ];  joint swelling[  ];  joint erythema[  ];  joint pain[  ];  back pain[  ];  Heme/Lymph: bruising[  ];  bleeding[  ];  anemia[  ];  Neuro: TIA[  ];  headaches[  ];  stroke[ yes ];  vertigo[  ];  seizures[  ];   paresthesias[  ];  difficulty walking[  ];  Psych:depression[ yes ]; anxiety[ yes ];  Endocrine: diabetes[ yes  ];  thyroid dysfunction[ yes  ];              Physical Exam: BP (!) 147/86 (BP Location:  Right Arm)   Pulse 61   Temp 97.8 F (36.6 C) (Oral)   Resp 16   Ht 5\' 9"  (1.753 m)   Wt 56.1 kg   SpO2 100%   BMI 18.26 kg/m    General appearance: alert, cooperative and no distress Resp: clear to auscultation bilaterally Cardio: regular rate and rhythm, S1, S2 normal, no murmur, click, rub or gallop and with frequent PVCs GI: soft, non-tender; bowel sounds normal; no masses,  no organomegaly Extremities: extremities normal, atraumatic, no cyanosis or edema Neurologic: Grossly normal  Diagnostic Studies & Laboratory data:  07/03/2017 Cardiac Catheterization  1. Low filling pressure and preserved cardiac output.  2. 90% ostial LCx stenosis.  3. Napkin ring-like stenosis in the proximal LAD, at least 60% stenosis.   50 cc contrast used.  Patient was hydrated before, during and will be hydrated post case.    I will decrease her Lasix at home to 120 mg bid, will not start back on Lasix until Sunday.  She will need BMET early next week.   Patient will have MV surgery.  She will need CABG with grafting of LCx +/- LAD.    10/08/2017 Echocardiogram  Result status: Final result                              *[place]*                   *[facility]*                         [address]                        [place], [place] [address]                            [phone]  ------------------------------------------------------------------- Transesophageal Echocardiography  Patient:    Nusbaum, Srihitha F MR #:       [id number] Study Date: 10/08/2017 Gender:     F Age:        67 Height:     172.7 cm Weight:     60 .3 kg BSA:        1.7 m^2 Pt. Status: Room:   ADMITTING    [name], M.D.  ATTENDING    [name], M.D.  ORDERING     [name], M.D.  PERFORMING  [name], M.D.  REFERRING    [name], M.D.  SONOGRAPHER  Merrie Roof, RDCS  cc:  ------------------------------------------------------------------- LV EF:  55% -   60%  ------------------------------------------------------------------- Indications:      Mitral regurgitation 424.0.  ------------------------------------------------------------------- History:   Risk factors:  Hypertension. Diabetes mellitus. Dyslipidemia.  ------------------------------------------------------------------- Study Conclusions  - Left ventricle: The cavity size was normal. Wall thickness was   increased in a pattern of mild LVH. Systolic function was normal.   The estimated ejection fraction was in the range of 55% to 60%.   Wall motion was normal; there were no regional wall motion   abnormalities. - Aortic valve: There was no stenosis. There was trivial   regurgitation. - Aorta: Grade 4 plaque in the descending thoracic aorta and arch. - Mitral valve: The mitral valve was thickened towards the leaflet   tips, morphology suggests rheumatic heart disease. There was no   significant stenosis, mean gradient 4 mmHg. There was severe   mitral regurgitation with ERO 0.42 cm^2. There was systolic flow   reversal in 1 out of 2 pulmonary veins interrogated. - Left atrium: The atrium was mildly to moderately dilated. No   evidence of thrombus in the atrial cavity or appendage. - Right ventricle: The cavity size was normal. Systolic function   was normal. - Right atrium: No evidence of thrombus in the atrial cavity or   appendage. - Atrial septum: There was a small PFO by color doppler but bubble   study was negative. - Tricuspid valve: Trivial TR, peak RV-RA gradient 37 mmHg. 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         Recent Radiology Findings:   No results found.   I have independently reviewed the above radiologic studies and discussed with the patient   Recent Lab Findings: Lab Results  Component Value Date   WBC 4.5 02/10/2018   HGB 10.0 (L) 02/10/2018    HCT 29.8 (L) 02/10/2018   PLT 187 02/10/2018   GLUCOSE 111 (H) 02/10/2018   CHOL 159 06/18/2017   TRIG 146 06/18/2017   HDL 66 06/18/2017   LDLCALC 64 06/18/2017   ALT 15 01/11/2018   AST 20 01/11/2018   NA 138 02/10/2018   K 3.7 02/10/2018   CL 105 02/10/2018   CREATININE 4.22 (H) 02/10/2018   BUN 38 (H) 02/10/2018   CO2 23 02/10/2018   TSH 2.884 10/26/2017   INR 1.36 02/09/2018   HGBA1C 6.9 (H) 12/21/2017      Assessment / Plan:      1. CAD-most recent cath was 06/2017 which showed 90% ostial left circumflex stenosis and proximal LAD stenosis of at least 60%. Did have an increase in frequency of symptoms over the last week. Currently chest pain free and on heparin gtt and nitro gtt. Plan for CABG/MVR/possible MAZE next week with Dr. Roxy Manns. Continue medical  Management per Cardiology until timing of surgery. 2. 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  Volume status on exam appears euvolemic.  She is currently on 120 mg of Lasix twice daily.  She is getting a follow-up echocardiogram today. 3.  Hypertension-appears to be well controlled on her current medication regimen. 4.  Paroxysmal atrial fibrillation-normal sinus rhythm today in the 70s.  She was on Eliquis at home however is currently being held and on heparin gtt.  She  had some junctional bradycardia on amiodarone so this is being held. Possibly MAZE intraoperatively.  5. CKD stage IV-creatinine 4.22 today, trending down. Electrolytes okay. Renal has been consulted for possible per-op HD if needed.  6. 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 Plan for mitral valve repair/replacement as soon as OR is available.  7. Dental caries/retained dental root-recent surgery with continued dental pain. May need Dr. Enrique Sack to re-evaluate.  8. Depression/Anxiety-continue current home medication regimen  9. Recent pneumonia-completed a full course of antibiotics per the  patient. WBC is normal. No fevers.   Plan: Procedure reviewed with the patient. All questions were answered to her satisfaction. She would like to speak with someone later today when her family arrives about timing of surgery.   I  spent 30 minutes counseling the patient face to face.   Nicholes Rough, PA-C  02/10/2018 9:16 AM

## 2018-02-11 ENCOUNTER — Inpatient Hospital Stay (HOSPITAL_COMMUNITY): Payer: Medicare Other

## 2018-02-11 DIAGNOSIS — E440 Moderate protein-calorie malnutrition: Secondary | ICD-10-CM

## 2018-02-11 DIAGNOSIS — E46 Unspecified protein-calorie malnutrition: Secondary | ICD-10-CM | POA: Diagnosis present

## 2018-02-11 LAB — GLUCOSE, CAPILLARY
Glucose-Capillary: 101 mg/dL — ABNORMAL HIGH (ref 70–99)
Glucose-Capillary: 131 mg/dL — ABNORMAL HIGH (ref 70–99)
Glucose-Capillary: 137 mg/dL — ABNORMAL HIGH (ref 70–99)
Glucose-Capillary: 160 mg/dL — ABNORMAL HIGH (ref 70–99)

## 2018-02-11 LAB — RENAL FUNCTION PANEL
Albumin: 3.7 g/dL (ref 3.5–5.0)
Anion gap: 9 (ref 5–15)
BUN: 39 mg/dL — ABNORMAL HIGH (ref 8–23)
CO2: 27 mmol/L (ref 22–32)
Calcium: 9.4 mg/dL (ref 8.9–10.3)
Chloride: 104 mmol/L (ref 98–111)
Creatinine, Ser: 4.28 mg/dL — ABNORMAL HIGH (ref 0.44–1.00)
GFR calc Af Amer: 11 mL/min — ABNORMAL LOW (ref >60)
GFR calc non Af Amer: 10 mL/min — ABNORMAL LOW (ref >60)
Glucose, Bld: 121 mg/dL — ABNORMAL HIGH (ref 70–99)
Phosphorus: 4 mg/dL (ref 2.5–4.6)
Potassium: 3.9 mmol/L (ref 3.5–5.1)
Sodium: 140 mmol/L (ref 135–145)

## 2018-02-11 LAB — PARATHYROID HORMONE, INTACT (NO CA): PTH: 235 pg/mL — ABNORMAL HIGH (ref 15–65)

## 2018-02-11 LAB — CBC
HCT: 31 % — ABNORMAL LOW (ref 36.0–46.0)
Hemoglobin: 10.4 g/dL — ABNORMAL LOW (ref 12.0–15.0)
MCH: 26.5 pg (ref 26.0–34.0)
MCHC: 33.5 g/dL (ref 30.0–36.0)
MCV: 78.9 fL (ref 78.0–100.0)
Platelets: 204 10*3/uL (ref 150–400)
RBC: 3.93 MIL/uL (ref 3.87–5.11)
RDW: 13.3 % (ref 11.5–15.5)
WBC: 5.1 10*3/uL (ref 4.0–10.5)

## 2018-02-11 LAB — HEPARIN LEVEL (UNFRACTIONATED): Heparin Unfractionated: 1.38 IU/mL — ABNORMAL HIGH (ref 0.30–0.70)

## 2018-02-11 LAB — HEPATITIS B SURFACE ANTIBODY,QUALITATIVE: Hep B S Ab: NONREACTIVE

## 2018-02-11 LAB — APTT
aPTT: 61 seconds — ABNORMAL HIGH (ref 24–36)
aPTT: 115 seconds — ABNORMAL HIGH (ref 24–36)
aPTT: 116 seconds — ABNORMAL HIGH (ref 24–36)

## 2018-02-11 LAB — HEPATITIS C ANTIBODY (REFLEX): HCV Ab: <0.1 s/co ratio (ref 0.0–0.9)

## 2018-02-11 LAB — HCV COMMENT:

## 2018-02-11 LAB — HEPATITIS B SURFACE ANTIGEN: Hepatitis B Surface Ag: NEGATIVE

## 2018-02-11 LAB — TROPONIN I: Troponin I: <0.03 ng/mL (ref <0.03)

## 2018-02-11 MED ORDER — ISOSORBIDE MONONITRATE ER 60 MG PO TB24
120.0000 mg | ORAL_TABLET | Freq: Every day | ORAL | Status: DC
  Administered 2018-02-11 – 2018-02-14 (×4): 120 mg via ORAL
  Filled 2018-02-11 (×4): qty 2

## 2018-02-11 MED ORDER — CALCITRIOL 0.25 MCG PO CAPS
0.5000 ug | ORAL_CAPSULE | Freq: Every day | ORAL | Status: DC
  Administered 2018-02-11 – 2018-02-17 (×7): 0.5 ug via ORAL
  Filled 2018-02-11 (×7): qty 2

## 2018-02-11 NOTE — Progress Notes (Signed)
CARDIAC REHAB PHASE I   Preop education completed with pt. Pt given and reviewed Cardiac surgery booklet. Reviewed [facility] care guide. IS given, pt demonstrated 1750. Reviewed importance of walking and IS use post op, along with sternal precautions. In-the-tube sheet given. Will continue to follow to reinforce education and ambulate as able.  [id number] Rufina Falco, RN BSN 02/11/2018 3:08 PM

## 2018-02-11 NOTE — Evaluation (Signed)
Physical Therapy Evaluation Patient Details Name: Jocelyn Hill MRN: [id number] DOB: 1950-07-22 Today's Date: 02/11/2018   History of Present Illness  66 yo admitted from heart failure clinic with chest pain and SOB. Pt with planned CABG/MVR/MAze for 8/29 PMHx: h/o mitral regurgitation, chronic diastolic CHF, CAD s/p MI, HTN, DM2, CKD IV-V, h/o Stroke, anxiety, depression and GERD  Clinical Impression  Pt with flat affect reporting neck, back and stomach discomfort but willing to mobilize. Pt reports she used RW after last admission but hasn't been using for several months. Pt reports fatigue with all activities making it difficult for her to do much and daughter helps with housework. Pt with decreased strength, gait and activity tolerance who will benefit from acute therapy to maximize mobility, function, safety and education for upcoming CABG.   HR 59-63 with session    Follow Up Recommendations Home health PT    Equipment Recommendations  None recommended by PT    Recommendations for Other Services       Precautions / Restrictions        Mobility  Bed Mobility Overal bed mobility: Modified Independent             General bed mobility comments: HOB 30 degrees  Transfers Overall transfer level: Modified independent                  Ambulation/Gait Ambulation/Gait assistance: Min guard Gait Distance (Feet): 150 Feet Assistive device: None Gait Pattern/deviations: Step-through pattern   Gait velocity interpretation: >2.62 ft/sec, indicative of community ambulatory General Gait Details: pt with slow and cautious gait with reaching out for hand rail x 2 during gait despite denying need for Rw as beginning of session. HR 59-62  Stairs            Wheelchair Mobility    Modified Rankin (Stroke Patients Only)       Balance Overall balance assessment: History of Falls                                           Pertinent Vitals/Pain  Pain Assessment: 0-10 Pain Score: 3  Pain Location: stomach ache, back and head throbbing Pain Descriptors / Indicators: Aching;Throbbing Pain Intervention(s): Limited activity within patient's tolerance    Home Living Family/patient expects to be discharged to:: Private residence Living Arrangements: Alone Available Help at Discharge: Family;Available 24 hours/day Type of Home: Apartment Home Access: Level entry     Home Layout: One level Home Equipment: Walker - 2 wheels      Prior Function Level of Independence: Needs assistance   Gait / Transfers Assistance Needed: walks without AD  ADL's / Homemaking Assistance Needed: daughter does the cooking, cleaning, grocery shopping  Comments: daughter or grandkids can stay with pt. Drives short distances     Hand Dominance        Extremity/Trunk Assessment   Upper Extremity Assessment Upper Extremity Assessment: Generalized weakness    Lower Extremity Assessment Lower Extremity Assessment: Generalized weakness    Cervical / Trunk Assessment Cervical / Trunk Assessment: Normal  Communication   Communication: No difficulties  Cognition Arousal/Alertness: Awake/alert Behavior During Therapy: WFL for tasks assessed/performed Overall Cognitive Status: Impaired/Different from baseline Area of Impairment: Safety/judgement                         Safety/Judgement: Decreased awareness of deficits  General Comments: pt picking at [place] with fork repeatedly rather than using knife      General Comments General comments (skin integrity, edema, etc.): pt reports 3 falls in last year    Exercises     Assessment/Plan    PT Assessment Patient needs continued PT services  PT Problem List Decreased strength;Decreased mobility;Decreased safety awareness;Decreased activity tolerance;Decreased balance;Decreased knowledge of use of DME       PT Treatment Interventions Gait training;Therapeutic  exercise;Patient/family education;Functional mobility training;Balance training;DME instruction;Therapeutic activities    PT Goals (Current goals can be found in the Care Plan section)  Acute Rehab PT Goals Patient Stated Goal: return home PT Goal Formulation: With patient Time For Goal Achievement: 02/25/18 Potential to Achieve Goals: Fair    Frequency Min 3X/week   Barriers to discharge        Co-evaluation               AM-PAC PT "6 Clicks" Daily Activity  Outcome Measure Difficulty turning over in bed (including adjusting bedclothes, sheets and blankets)?: None Difficulty moving from lying on back to sitting on the side of the bed? : None Difficulty sitting down on and standing up from a chair with arms (e.g., wheelchair, bedside commode, etc,.)?: None Help needed moving to and from a bed to chair (including a wheelchair)?: A Little Help needed walking in hospital room?: A Little Help needed climbing 3-5 steps with a railing? : A Little 6 Click Score: 21    End of Session Equipment Utilized During Treatment: Gait belt Activity Tolerance: Patient tolerated treatment well Patient left: in chair;with call bell/phone within reach Nurse Communication: Mobility status PT Visit Diagnosis: Other abnormalities of gait and mobility (R26.89);Muscle weakness (generalized) (M62.81)    Time: 4665-9935 PT Time Calculation (min) (ACUTE ONLY): 22 min   Charges:   PT Evaluation $PT Eval Moderate Complexity: 1 [address], PT [phone]   [place] 02/11/2018, 2:21 PM

## 2018-02-11 NOTE — Progress Notes (Signed)
Patient ID: Jocelyn Hill, female   DOB: 04-22-1951, 67 y.o.   MRN: [id number]     Advanced Heart Failure Rounding Note  PCP-Cardiologist: No primary care provider on file.   Subjective:    Still with episodes of chest pain on and off but says that it is better than at home with NTG gtt.    Objective:   Weight Range: 57.2 kg Body mass index is 18.61 kg/m.   Vital Signs:   Temp:  [97.5 F (36.4 C)-98.1 F (36.7 C)] 97.7 F (36.5 C) (08/14 2300) Pulse Rate:  [56-80] 56 (08/15 0749) Resp:  [12-15] 14 (08/15 0749) BP: (120-149)/(67-89) 138/83 (08/15 0749) SpO2:  [97 %-100 %] 97 % (08/15 0749) Weight:  [57.2 kg] 57.2 kg (08/15 0300) Last BM Date: 02/08/18  Weight change: Filed Weights   02/09/18 1834 02/10/18 0315 02/11/18 0300  Weight: 57.5 kg 56.1 kg 57.2 kg    Intake/Output:   Intake/Output Summary (Last 24 hours) at 02/11/2018 0847 Last data filed at 02/11/2018 0400 Gross per 24 hour  Intake 872.98 ml  Output 799 ml  Net 73.98 ml      Physical Exam    General: NAD Neck: No JVD, no thyromegaly or thyroid nodule.  Lungs: Clear to auscultation bilaterally with normal respiratory effort. CV: Nondisplaced PMI.  Heart regular S1/S2, no S3/S4, 3/6 HSM apex.  No peripheral edema.   Abdomen: Soft, nontender, no hepatosplenomegaly, no distention.  Skin: Intact without lesions or rashes.  Neurologic: Alert and oriented x 3.  Psych: Normal affect. Extremities: No clubbing or cyanosis.  HEENT: Normal.    Telemetry   NSR in 60s (personally reviewed)  EKG    NSR, old ASMI, QTc 453 msec (similar to prior)  Labs    CBC Recent Labs    02/10/18 0400 02/11/18 0029  WBC 4.5 5.1  HGB 10.0* 10.4*  HCT 29.8* 31.0*  MCV 78.4 78.9  PLT 187 119   Basic Metabolic Panel Recent Labs    02/10/18 0400 02/11/18 0029  NA 138 140  K 3.7 3.9  CL 105 104  CO2 23 27  GLUCOSE 111* 121*  BUN 38* 39*  CREATININE 4.22* 4.28*  CALCIUM 9.0 9.4  PHOS  --  4.0   Liver  Function Tests Recent Labs    02/11/18 0029  ALBUMIN 3.7   No results for input(s): LIPASE, AMYLASE in the last 72 hours. Cardiac Enzymes Recent Labs    02/09/18 1547 02/09/18 2147 02/10/18 0400  TROPONINI <0.03 <0.03 <0.03    BNP: BNP (last 3 results) Recent Labs    03/12/17 1400 02/09/18 1547  BNP 2,230.5* 1,320.1*    ProBNP (last 3 results) No results for input(s): PROBNP in the last 8760 hours.   D-Dimer No results for input(s): DDIMER in the last 72 hours. Hemoglobin A1C No results for input(s): HGBA1C in the last 72 hours. Fasting Lipid Panel No results for input(s): CHOL, HDL, LDLCALC, TRIG, CHOLHDL, LDLDIRECT in the last 72 hours. Thyroid Function Tests No results for input(s): TSH, T4TOTAL, T3FREE, THYROIDAB in the last 72 hours.  Invalid input(s): FREET3  Other results:   Imaging    No results found.   Medications:     Scheduled Medications: . amLODipine  10 mg Oral Daily  . aspirin EC  81 mg Oral Daily  . atorvastatin  40 mg Oral Daily  . B-complex with vitamin C  1 tablet Oral Daily  . buPROPion  100 mg Oral TID AC  .  busPIRone  15 mg Oral BID  . calcitRIOL  0.5 mcg Oral Daily  . carvedilol  25 mg Oral BID PC  . DULoxetine  30 mg Oral Daily  . feeding supplement (GLUCERNA SHAKE)  237 mL Oral TID BM  . furosemide  120 mg Oral BID  . gabapentin  300 mg Oral QHS  . hydrALAZINE  25 mg Oral Q8H  . insulin aspart  0-9 Units Subcutaneous TID WC  . levothyroxine  112 mcg Oral QAC breakfast  . montelukast  10 mg Oral Daily  . pantoprazole  40 mg Oral Daily  . potassium chloride SA  40 mEq Oral Daily  . ranolazine  500 mg Oral BID  . sodium chloride flush  3 mL Intravenous Q12H    Infusions: . sodium chloride    . heparin 1,100 Units/hr (02/11/18 0142)  . nitroGLYCERIN 45 mcg/min (02/11/18 0729)    PRN Medications: sodium chloride, acetaminophen, albuterol, ALPRAZolam, morphine injection, ondansetron (ZOFRAN) IV, ondansetron,  oxyCODONE, sodium chloride flush   Assessment/Plan   1. CAD:  LHC in 1/19 showed severe ostial LCx disease and moderate LAD disease. She will need CABG with MVR. Having more frequent CP with associated SOB over the last week, resolves with 1-2 SL nitro.  She is now having several episodes/day.  She was admitted from clinic on 8/13 due to chest pain at rest in clinic that was prolonged. She has been started on NTG gtt with improvement in CP pattern.  Also in discussion with pharmacy, I added ranolazine. Troponin negative and ECG unchanged, concern for unstable angina at admission.  - Continue ASA 81 and atorvastatin 40 mg daily. - She is now on heparin gtt, will continue.   - Continue amlodipine 10 mg daily and Coreg 25 mg BID for angina.  - I will try to get her back on Imdur 120 mg daily rather than NTG gtt today.    - Added ranolazine 500 mg bid.  Discussed with pharmacy, not well studied with CKD IV but not contra-indicated.  Will follow ECG daily for QT interval (ok today).  - Seen by Dr Roxy Manns, given worsening chest pain episodes plan to move up surgery (CABG/MVR/Maze) to early next week.  Will keep her in hospital until then.  2. 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 Stable NYHA class III dyspnea.  This is complicated by CKD stage IV. Volume status looks ok.  - Ultimately, needs mitral valve fixed to keep her out of CHF.  -Continue Lasix 120 mg po bid. 3. CKD Stage IV: Creatinine stable at 4.28 today.  Renal following, will need perm cath placed when she has cardiac surgery.  4. HTN:SBP 130s.  Can titrate up hydralazine as needed.  Hold off on doxazosin for now unless BP climbing.  She remains on Coreg and amlodipine.  5.  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 Confirmed on 4/19 TEE.  Plan for MVR next week. 6. Atrial fibrillation: Paroxysmal.  NSR today. She had a spontaneous RP hemorrhage in 9/18 but has been back on Eliquis.   - Eliquis held  and heparin gtt started with active chest pain and plan for surgery next week.  - She is off amiodarone due to h/o junctional bradycardia.  - Maze planned with MVR.   7. Mobilize with PT/OT.   Length of Stay: 2  [name], MD  02/11/2018, 8:47 AM  Advanced Heart Failure Team Pager [phone] (M-F; 7a - 4p)  Please contact [facility] for night-coverage after hours (4p -7a ) and weekends on [email or web address]

## 2018-02-11 NOTE — Progress Notes (Signed)
Subjective: Interval History: has complaints more CP.  Objective: Vital signs in last 24 hours: Temp:  [97.5 F (36.4 C)-98.1 F (36.7 C)] 97.7 F (36.5 C) (08/14 2300) Pulse Rate:  [57-80] 80 (08/14 2300) Resp:  [12-15] 15 (08/14 2300) BP: (120-149)/(67-89) 138/89 (08/15 0612) SpO2:  [97 %-100 %] 98 % (08/14 2300) Weight:  [57.2 kg] 57.2 kg (08/15 0300) Weight change: -0.347 kg  Intake/Output from previous day: 08/14 0701 - 08/15 0700 In: 1411.9 [P.O.:960; I.V.:451.9] Out: 999 [Urine:999] Intake/Output this shift: No intake/output data recorded.  General appearance: alert, cooperative, no distress and depressed Resp: diminished breath sounds bilaterally Cardio: S1, S2 normal and systolic murmur: holosystolic 2/6, blowing at apex GI: soft, non-tender; bowel sounds normal; no masses,  no organomegaly Extremities: extremities normal, atraumatic, no cyanosis or edema  Lab Results: Recent Labs    02/10/18 0400 02/11/18 0029  WBC 4.5 5.1  HGB 10.0* 10.4*  HCT 29.8* 31.0*  PLT 187 204   BMET:  Recent Labs    02/10/18 0400 02/11/18 0029  NA 138 140  K 3.7 3.9  CL 105 104  CO2 23 27  GLUCOSE 111* 121*  BUN 38* 39*  CREATININE 4.22* 4.28*  CALCIUM 9.0 9.4   Recent Labs    02/10/18 1253  PTH 235*   Iron Studies:  Recent Labs    02/10/18 1534  IRON 120  TIBC 245*    Studies/Results: Dg Chest 2 View  Result Date: 02/10/2018 CLINICAL DATA:  Shortness of breath. EXAM: CHEST - 2 VIEW COMPARISON:  Radiographs of January 11, 2018. FINDINGS: Stable cardiomegaly. Atherosclerosis of thoracic aorta is noted. No pneumothorax or pleural effusion is noted. No acute pulmonary disease is noted. Bony thorax is unremarkable. IMPRESSION: No active cardiopulmonary disease. Aortic Atherosclerosis (ICD10-I70.0). Electronically Signed   By: Marijo Conception, M.D.   On: 02/10/2018 09:59    I have reviewed the patient's current medications.  Assessment/Plan: 1 CKD 5  Unchanged.  Will need PC at surgery.   2 Anemia stable 3 HPTH vit D 4 MV dz per cards 5 CAD 6 PVD with claudication and rest pain 7 DM 8 HTN controlled P vit D, will follow   LOS: 2 days   Jeneen Rinks Laquasia Pincus 02/11/2018,7:03 AM

## 2018-02-11 NOTE — Progress Notes (Signed)
ANTICOAGULATION CONSULT NOTE - Follow Up Consult  Pharmacy Consult for Heparin Indication: atrial fibrillation  Allergies  Allergen Reactions  . Ace Inhibitors Anaphylaxis and Swelling  . Motrin Ib [Ibuprofen] Anaphylaxis    Patient Measurements: Height: 5\' 9"  (175.3 cm) Weight: 126 lb (57.2 kg) IBW/kg (Calculated) : 66.2 Heparin Dosing Weight:  57.2 kg  Vital Signs: Temp: 97.7 F (36.5 C) (08/15 1955) Temp Source: Oral (08/15 1955) BP: 144/80 (08/15 1955) Pulse Rate: 62 (08/15 1955)  Labs: Recent Labs    02/09/18 1547  02/09/18 1600 02/09/18 2147 02/10/18 0400  02/11/18 0029 02/11/18 0929 02/11/18 1516 02/11/18 2051  HGB 10.7*  --   --   --  10.0*  --  10.4*  --   --   --   HCT 32.1*  --   --   --  29.8*  --  31.0*  --   --   --   PLT 207  --   --   --  187  --  204  --   --   --   APTT  --    < > 34  --  66*   < > 61* 115*  --  116*  LABPROT 16.6*  --   --   --   --   --   --   --   --   --   INR 1.36  --   --   --   --   --   --   --   --   --   HEPARINUNFRC  --   --  >2.20*  --  >2.20*  --  1.38*  --   --   --   CREATININE 4.48*  --   --   --  4.22*  --  4.28*  --   --   --   TROPONINI <0.03  --   --  <0.03 <0.03  --   --   --  <0.03  --    < > = values in this interval not displayed.    Estimated Creatinine Clearance: 11.7 mL/min (A) (by C-G formula based on SCr of 4.28 mg/dL (H)).  Assessment:  Anticoag: apix PTA held for IV hep. APTT 115>116 unchanged this PM  Goal of Therapy:  aPTT 66-102 seconds Monitor platelets by anticoagulation protocol: Yes   Plan:  - Decrease IV heparin to 950 units/hr Daily aPTT/HL and CBC in AM  Shandrell Boda S. Alford [place], PharmD, BCPS Clinical Staff Pharmacist Pager [phone]  Eilene Ghazi Stillinger 02/11/2018,9:34 PM

## 2018-02-11 NOTE — Progress Notes (Signed)
ANTICOAGULATION CONSULT NOTE - Follow-Up Consult  Pharmacy Consult for Heparin Indication: chest pain/ACS and atrial fibrillation  Allergies  Allergen Reactions  . Ace Inhibitors Anaphylaxis and Swelling  . Motrin Ib [Ibuprofen] Anaphylaxis    Patient Measurements: Height: 5\' 9"  (175.3 cm) Weight: 123 lb 10.9 oz (56.1 kg) IBW/kg (Calculated) : 66.2 Heparin Dosing Weight: 57kg  Vital Signs: Temp: 97.7 F (36.5 C) (08/14 2300) Temp Source: Oral (08/14 2300) BP: 134/77 (08/14 2300) Pulse Rate: 80 (08/14 2300)  Labs: Recent Labs    02/09/18 1547  02/09/18 1600 02/09/18 2147 02/10/18 0400 02/10/18 1534 02/11/18 0029  HGB 10.7*  --   --   --  10.0*  --  10.4*  HCT 32.1*  --   --   --  29.8*  --  31.0*  PLT 207  --   --   --  187  --  204  APTT  --    < > 34  --  66* 52* 61*  LABPROT 16.6*  --   --   --   --   --   --   INR 1.36  --   --   --   --   --   --   HEPARINUNFRC  --   --  >2.20*  --  >2.20*  --  1.38*  CREATININE 4.48*  --   --   --  4.22*  --  4.28*  TROPONINI <0.03  --   --  <0.03 <0.03  --   --    < > = values in this interval not displayed.    Estimated Creatinine Clearance: 11.5 mL/min (A) (by C-G formula based on SCr of 4.28 mg/dL (H)).   Medical History: Past Medical History:  Diagnosis Date  . Anemia    low iron  . Anxiety   . Arthritis   . Bronchitis   . CHF (congestive heart failure) ([place])   . CKD (chronic kidney disease) stage 4, GFR 15-29 ml/min (HCC) 03/16/2017  . Coronary artery disease   . Depression   . Diabetes mellitus without complication ([place])   . Diet-controlled diabetes mellitus ([place])   . Ganglion cyst    left wrist  . GERD (gastroesophageal reflux disease)   . Headache    chronic headaches  . Heart failure ([place])   . Hyperlipidemia   . Hypertension   . Hypothyroidism   . Mitral regurgitation   . Myocardial infarction ([place])    3x last one 2008  . PAF (paroxysmal atrial fibrillation) ([place])   . Pneumonia    x 2  .  Stroke [facility])    no lasting residual - ? 2014  . Umbilical hernia    Assessment:  35 yoF on apixaban for AFib admitted for cardiac surgery evaluation. Pharmacy to bridge with IV heparin while awaiting recommendations. Initial heparin level elevated due to apixaban, aPTT therapeutic at 66 seconds but on the low end of goal.   8/15 AM update: heparin level still elevated due to apixaban, aPTT is low at 61, Hgb stable  Goal of Therapy:  Heparin level 0.3-0.7 units/ml aPTT 66-102 seconds Monitor platelets by anticoagulation protocol: Yes   Plan:  Increase heparin to 1100 units/hr 1000 aPTT  Narda Bonds, PharmD, BCPS Clinical Pharmacist Phone: [phone]

## 2018-02-11 NOTE — Progress Notes (Signed)
  Paged for continued chest pain.   Pt states it is a 4/10 pain, much LESS severe than admission.   She is working with CR and getting incentive spirometer up to 1300 + without symptoms.   Discussed with Dr. Aundra Dubin. Suspect non-cardiac component.   Ok to give Oxy as ordered. Will check Troponin x 1.    Legrand Como [address]" [place], PA-C 02/11/2018 2:55 PM

## 2018-02-11 NOTE — Progress Notes (Signed)
ANTICOAGULATION CONSULT NOTE - Follow-Up Consult  Pharmacy Consult for heparin Indication: chest pain/ACS and atrial fibrillation  Allergies  Allergen Reactions  . Ace Inhibitors Anaphylaxis and Swelling  . Motrin Ib [Ibuprofen] Anaphylaxis    Patient Measurements: Height: 5\' 9"  (175.3 cm) Weight: 126 lb (57.2 kg) IBW/kg (Calculated) : 66.2 Heparin Dosing Weight: 57kg  Vital Signs: BP: 132/83 (08/15 0859) Pulse Rate: 56 (08/15 0749)  Labs: Recent Labs    02/09/18 1547  02/09/18 1600 02/09/18 2147 02/10/18 0400 02/10/18 1534 02/11/18 0029 02/11/18 0929  HGB 10.7*  --   --   --  10.0*  --  10.4*  --   HCT 32.1*  --   --   --  29.8*  --  31.0*  --   PLT 207  --   --   --  187  --  204  --   APTT  --    < > 34  --  66* 52* 61* 115*  LABPROT 16.6*  --   --   --   --   --   --   --   INR 1.36  --   --   --   --   --   --   --   HEPARINUNFRC  --   --  >2.20*  --  >2.20*  --  1.38*  --   CREATININE 4.48*  --   --   --  4.22*  --  4.28*  --   TROPONINI <0.03  --   --  <0.03 <0.03  --   --   --    < > = values in this interval not displayed.    Estimated Creatinine Clearance: 11.7 mL/min (A) (by C-G formula based on SCr of 4.28 mg/dL (H)).   Medical History: Past Medical History:  Diagnosis Date  . Anemia    low iron  . Anxiety   . Arthritis   . Bronchitis   . CHF (congestive heart failure) ([place])   . CKD (chronic kidney disease) stage 4, GFR 15-29 ml/min (HCC) 03/16/2017  . Coronary artery disease   . Depression   . Diabetes mellitus without complication ([place])   . Diet-controlled diabetes mellitus ([place])   . Ganglion cyst    left wrist  . GERD (gastroesophageal reflux disease)   . Headache    chronic headaches  . Heart failure ([place])   . Hyperlipidemia   . Hypertension   . Hypothyroidism   . Mitral regurgitation   . Myocardial infarction ([place])    3x last one 2008  . PAF (paroxysmal atrial fibrillation) ([place])   . Pneumonia    x 2  . Stroke [facility])    no  lasting residual - ? 2014  . Umbilical hernia    Assessment:  20 yoF on apixaban for AFib admitted for cardiac surgery evaluation. Pharmacy to bridge with IV heparin while awaiting recommendations. Heparin level remains falsely elevated due to apixaban. Repeat aPTT slightly supratherapeutic at 115 seconds, no issues with infusion per RN.  Goal of Therapy:  Heparin level 0.3-0.7 units/ml aPTT 66-102 seconds Monitor platelets by anticoagulation protocol: Yes   Plan:  Reduce heparin to 1050 units/hr Recheck aPTT in [place], PharmD, BCPS Clinical Pharmacist [phone] Please check AMION for all [place] numbers 02/11/2018

## 2018-02-12 ENCOUNTER — Other Ambulatory Visit: Payer: Self-pay | Admitting: *Deleted

## 2018-02-12 DIAGNOSIS — I2510 Atherosclerotic heart disease of native coronary artery without angina pectoris: Secondary | ICD-10-CM

## 2018-02-12 DIAGNOSIS — I340 Nonrheumatic mitral (valve) insufficiency: Secondary | ICD-10-CM

## 2018-02-12 LAB — RENAL FUNCTION PANEL
Albumin: 3.8 g/dL (ref 3.5–5.0)
Anion gap: 9 (ref 5–15)
BUN: 39 mg/dL — ABNORMAL HIGH (ref 8–23)
CO2: 27 mmol/L (ref 22–32)
Calcium: 9.9 mg/dL (ref 8.9–10.3)
Chloride: 102 mmol/L (ref 98–111)
Creatinine, Ser: 4.37 mg/dL — ABNORMAL HIGH (ref 0.44–1.00)
GFR calc Af Amer: 11 mL/min — ABNORMAL LOW (ref >60)
GFR calc non Af Amer: 10 mL/min — ABNORMAL LOW (ref >60)
Glucose, Bld: 169 mg/dL — ABNORMAL HIGH (ref 70–99)
Phosphorus: 5.3 mg/dL — ABNORMAL HIGH (ref 2.5–4.6)
Potassium: 4.4 mmol/L (ref 3.5–5.1)
Sodium: 138 mmol/L (ref 135–145)

## 2018-02-12 LAB — GLUCOSE, CAPILLARY
Glucose-Capillary: 131 mg/dL — ABNORMAL HIGH (ref 70–99)
Glucose-Capillary: 160 mg/dL — ABNORMAL HIGH (ref 70–99)
Glucose-Capillary: 150 mg/dL — ABNORMAL HIGH (ref 70–99)
Glucose-Capillary: 136 mg/dL — ABNORMAL HIGH (ref 70–99)

## 2018-02-12 LAB — HEPARIN LEVEL (UNFRACTIONATED)
Heparin Unfractionated: 0.91 IU/mL — ABNORMAL HIGH (ref 0.30–0.70)
Heparin Unfractionated: 0.73 IU/mL — ABNORMAL HIGH (ref 0.30–0.70)

## 2018-02-12 LAB — CBC
HCT: 32.8 % — ABNORMAL LOW (ref 36.0–46.0)
Hemoglobin: 10.9 g/dL — ABNORMAL LOW (ref 12.0–15.0)
MCH: 26.8 pg (ref 26.0–34.0)
MCHC: 33.2 g/dL (ref 30.0–36.0)
MCV: 80.8 fL (ref 78.0–100.0)
Platelets: 207 10*3/uL (ref 150–400)
RBC: 4.06 MIL/uL (ref 3.87–5.11)
RDW: 13.6 % (ref 11.5–15.5)
WBC: 7.8 10*3/uL (ref 4.0–10.5)

## 2018-02-12 LAB — APTT
aPTT: 90 seconds — ABNORMAL HIGH (ref 24–36)
aPTT: 79 seconds — ABNORMAL HIGH (ref 24–36)

## 2018-02-12 MED ORDER — HYDRALAZINE HCL 50 MG PO TABS
75.0000 mg | ORAL_TABLET | Freq: Three times a day (TID) | ORAL | Status: DC
  Administered 2018-02-12 – 2018-02-15 (×7): 75 mg via ORAL
  Filled 2018-02-12 (×8): qty 2

## 2018-02-12 MED ORDER — NITROGLYCERIN 0.4 MG SL SUBL
SUBLINGUAL_TABLET | SUBLINGUAL | Status: AC
  Filled 2018-02-12: qty 1

## 2018-02-12 NOTE — Progress Notes (Signed)
ANTICOAGULATION CONSULT NOTE - Follow-Up Consult  Pharmacy Consult for Heparin Indication: chest pain/ACS and atrial fibrillation  Allergies  Allergen Reactions  . Ace Inhibitors Anaphylaxis and Swelling  . Motrin Ib [Ibuprofen] Anaphylaxis    Patient Measurements: Height: 5\' 9"  (175.3 cm) Weight: 126 lb (57.2 kg) IBW/kg (Calculated) : 66.2 Heparin Dosing Weight: 57kg  Vital Signs: Temp: 97.8 F (36.6 C) (08/15 2327) Temp Source: Oral (08/15 2327) BP: 144/84 (08/15 2327) Pulse Rate: 61 (08/15 2327)  Labs: Recent Labs    02/09/18 1547  02/09/18 2147 02/10/18 0400  02/11/18 0029 02/11/18 0929 02/11/18 1516 02/11/18 2051 02/12/18 0253  HGB 10.7*  --   --  10.0*  --  10.4*  --   --   --  10.9*  HCT 32.1*  --   --  29.8*  --  31.0*  --   --   --  32.8*  PLT 207  --   --  187  --  204  --   --   --  207  APTT  --    < >  --  66*   < > 61* 115*  --  116* 90*  LABPROT 16.6*  --   --   --   --   --   --   --   --   --   INR 1.36  --   --   --   --   --   --   --   --   --   HEPARINUNFRC  --    < >  --  >2.20*  --  1.38*  --   --   --  0.91*  CREATININE 4.48*  --   --  4.22*  --  4.28*  --   --   --  4.37*  TROPONINI <0.03  --  <0.03 <0.03  --   --   --  <0.03  --   --    < > = values in this interval not displayed.    Estimated Creatinine Clearance: 11.4 mL/min (A) (by C-G formula based on SCr of 4.37 mg/dL (H)).   Medical History: Past Medical History:  Diagnosis Date  . Anemia    low iron  . Anxiety   . Arthritis   . Bronchitis   . CHF (congestive heart failure) ([place])   . CKD (chronic kidney disease) stage 4, GFR 15-29 ml/min (HCC) 03/16/2017  . Coronary artery disease   . Depression   . Diabetes mellitus without complication ([place])   . Diet-controlled diabetes mellitus ([place])   . Ganglion cyst    left wrist  . GERD (gastroesophageal reflux disease)   . Headache    chronic headaches  . Heart failure ([place])   . Hyperlipidemia   . Hypertension   .  Hypothyroidism   . Mitral regurgitation   . Myocardial infarction ([place])    3x last one 2008  . PAF (paroxysmal atrial fibrillation) ([place])   . Pneumonia    x 2  . Stroke [facility])    no lasting residual - ? 2014  . Umbilical hernia    Assessment:  9 yoF on apixaban for AFib admitted for cardiac surgery evaluation. Pharmacy to bridge with IV heparin while awaiting recommendations. Initial heparin level elevated due to apixaban, aPTT therapeutic at 66 seconds but on the low end of goal.   8/16 AM update: heparin level still elevated due to apixaban, aPTT is therapeutic at 90, Hgb stable  Goal of Therapy:  Heparin level 0.3-0.7 units/ml aPTT 66-102 seconds Monitor platelets by anticoagulation protocol: Yes   Plan:  Cont heparin at 950 units/hr 1100 confirmatory aPTT  Narda Bonds, PharmD, BCPS Clinical Pharmacist Phone: [phone]

## 2018-02-12 NOTE — Progress Notes (Signed)
1 Nitro pulled from pyxis but not given. MD verbal order to give Oxy early instead.   Jocelyn  Beauregard Jarrells, RN

## 2018-02-12 NOTE — Progress Notes (Addendum)
      [address]       [place] [address]             [phone]     CARDIOTHORACIC SURGERY PROGRESS NOTE  Subjective: Some chest pain walking yesterday.  Nauseated today.  Hasn't had a bowel movement since Monday  Objective: Vital signs in last 24 hours: Temp:  [97.7 F (36.5 C)-98.2 F (36.8 C)] 97.7 F (36.5 C) (08/16 1505) Pulse Rate:  [61-65] 65 (08/16 1121) Cardiac Rhythm: Normal sinus rhythm (08/16 0750) Resp:  [13-22] 14 (08/16 1121) BP: (131-160)/(78-88) 131/78 (08/16 1505) SpO2:  [92 %-99 %] 93 % (08/16 1121) Weight:  [57.5 kg] 57.5 kg (08/16 7414)  Physical Exam:  Rhythm:   sinus  Breath sounds: clear  Heart sounds:  RRR w/ systolic murmur  Incisions:  n/a  Abdomen:  soft  Extremities:  warm   Intake/Output from previous day: 08/15 0701 - 08/16 0700 In: 1024.2 [P.O.:717; I.V.:307.2] Out: 1850 [Urine:1850] Intake/Output this shift: Total I/O In: 947.6 [P.O.:837; I.V.:110.6] Out: 1600 [Urine:1600]  Lab Results: Recent Labs    02/11/18 0029 02/12/18 0253  WBC 5.1 7.8  HGB 10.4* 10.9*  HCT 31.0* 32.8*  PLT 204 207   BMET:  Recent Labs    02/11/18 0029 02/12/18 0253  NA 140 138  K 3.9 4.4  CL 104 102  CO2 27 27  GLUCOSE 121* 169*  BUN 39* 39*  CREATININE 4.28* 4.37*  CALCIUM 9.4 9.9    CBG (last 3)  Recent Labs    02/12/18 0817 02/12/18 1219 02/12/18 1712  GLUCAP 131* 160* 150*   PT/INR:  No results for input(s): LABPROT, INR in the last 72 hours.  CXR:  N/A  Assessment/Plan:  We tentatively plan for OR next week on Thursday 8/22 Patient may be getting constipated Will ask [facility] to see in follow up prior to surgery re dialysis access   I spent in excess of 15 minutes during the conduct of this hospital encounter and >50% of this time involved direct face-to-face encounter with the patient for counseling and/or coordination of their care.    Rexene Alberts, MD 02/12/2018 5:42 PM

## 2018-02-12 NOTE — Progress Notes (Signed)
Nutrition Follow-up  DOCUMENTATION CODES:   Non-severe (moderate) malnutrition in context of chronic illness, Underweight  INTERVENTION:   -Continue MVI with minerals daily -Continue Glucerna Shake po TID, each supplement provides 220 kcal and 10 grams of protein  NUTRITION DIAGNOSIS:   Moderate Malnutrition related to chronic illness(CHF) as evidenced by percent weight loss, energy intake < 75% for > or equal to 1 month, mild fat depletion, moderate fat depletion, mild muscle depletion, moderate muscle depletion.  Ongoing  GOAL:   Patient will meet greater than or equal to 90% of their needs  Progressing  MONITOR:   PO intake, Supplement acceptance, Labs, Weight trends, Skin, I & O's  REASON FOR ASSESSMENT:   Consult Assessment of nutrition requirement/status  ASSESSMENT:   Jocelyn Hill. Thurmond is a 67 y.o. female with h/o mitral regurgitation, chronic diastolic CHF, CAD s/p MI, long standing HTN, DM2, CKD IV-V, h/o Stroke. Anxiety, depression and GERD. Patient reported that she has had 2 MIs treated at [facility] in [place] several years ago.  Pt admitted with CHF.   Reviewed I/O's: -826 ml x 24 hours and -1.5 L since admission.   RD consulted for assessment. Noted RD last evaluted pt on 02/10/18; see note for further details.  Spoke with pt at bedside, who reports improving appetite. However, pt had multiple complaints about foods, especially regarding food being cold; asked patient services manager from nutritional services to speak with pt.   Noted meal completion 75-100%. Pt reports she continues to consume Glucerna supplements. Noctuae pt to continue supplements.   Per MD notes, awaiting timing for CABG/MVR/MAZE. Pt will stay inpatient until surgery is scheduled.   Labs reviewed: CBGS: 131-160 (inpatient orders for glycemic control are 0-9 units insulin aspart TID with meals).   Diet Order:   Diet Order            Diet 2 gram sodium Room service  appropriate? Yes; Fluid consistency: Thin  Diet effective now              EDUCATION NEEDS:   Education needs have been addressed  Skin:  Skin Assessment: Reviewed RN Assessment  Last BM:  02/08/18  Height:   Ht Readings from Last 1 Encounters:  02/09/18 5\' 9"  (1.753 m)    Weight:   Wt Readings from Last 1 Encounters:  02/12/18 57.5 kg    Ideal Body Weight:  65.9 kg  BMI:  Body mass index is 18.72 kg/m.  Estimated Nutritional Needs:   Kcal:  6503-5465  Protein:  95-110 grams  Fluid:  1.7-1.9 L    Terreon Ekholm A. Jimmye Norman, RD, LDN, CDE Pager: [phone] After hours Pager: [phone]

## 2018-02-12 NOTE — Progress Notes (Signed)
Subjective: Interval History: has no complaint .  Objective: Vital signs in last 24 hours: Temp:  [97.4 F (36.3 C)-98 F (36.7 C)] 98 F (36.7 C) (08/16 0352) Pulse Rate:  [56-63] 63 (08/16 0352) Resp:  [9-22] 13 (08/16 0352) BP: (132-160)/(79-88) 160/88 (08/16 0352) SpO2:  [97 %-99 %] 99 % (08/16 0352) Weight:  [57.5 kg] 57.5 kg (08/16 0614) Weight change: 0.347 kg  Intake/Output from previous day: 08/15 0701 - 08/16 0700 In: 1024.2 [P.O.:717; I.V.:307.2] Out: 1850 [Urine:1850] Intake/Output this shift: No intake/output data recorded.  General appearance: alert, cooperative and no distress Resp: clear to auscultation bilaterally Cardio: S1, S2 normal and systolic murmur: holosystolic 2/6, blowing at 2nd left intercostal space GI: soft, non-tender; bowel sounds normal; no masses,  no organomegaly Extremities: extremities normal, atraumatic, no cyanosis or edema  Lab Results: Recent Labs    02/11/18 0029 02/12/18 0253  WBC 5.1 7.8  HGB 10.4* 10.9*  HCT 31.0* 32.8*  PLT 204 207   BMET:  Recent Labs    02/11/18 0029 02/12/18 0253  NA 140 138  K 3.9 4.4  CL 104 102  CO2 27 27  GLUCOSE 121* 169*  BUN 39* 39*  CREATININE 4.28* 4.37*  CALCIUM 9.4 9.9   Recent Labs    02/10/18 1253  PTH 235*   Iron Studies:  Recent Labs    02/10/18 1534  IRON 120  TIBC 245*    Studies/Results: Dg Chest 2 View  Result Date: 02/10/2018 CLINICAL DATA:  Shortness of breath. EXAM: CHEST - 2 VIEW COMPARISON:  Radiographs of January 11, 2018. FINDINGS: Stable cardiomegaly. Atherosclerosis of thoracic aorta is noted. No pneumothorax or pleural effusion is noted. No acute pulmonary disease is noted. Bony thorax is unremarkable. IMPRESSION: No active cardiopulmonary disease. Aortic Atherosclerosis (ICD10-I70.0). Electronically Signed   By: Marijo Conception, M.D.   On: 02/10/2018 09:59    I have reviewed the patient's current medications.  Assessment/Plan: 1 CKD5  Will need PC at  surgery.  Need to save nondominant arm 2 Anemia stable 3 HPTH vit D 4 MV Dz 5 CAD P PC at surgery , cont med, will not follow over weekend as just waiting    LOS: 3 days   Jeneen Rinks Sheyna Pettibone 02/12/2018,7:08 AM

## 2018-02-12 NOTE — Progress Notes (Signed)
ANTICOAGULATION CONSULT NOTE - Follow-Up Consult  Pharmacy Consult for Heparin Indication: chest pain/ACS and atrial fibrillation  Allergies  Allergen Reactions  . Ace Inhibitors Anaphylaxis and Swelling  . Motrin Ib [Ibuprofen] Anaphylaxis    Patient Measurements: Height: 5\' 9"  (175.3 cm) Weight: 126 lb 12.2 oz (57.5 kg) IBW/kg (Calculated) : 66.2 Heparin Dosing Weight: 57kg  Vital Signs: Temp: 98.2 F (36.8 C) (08/16 1121) Temp Source: Oral (08/16 1121) BP: 145/88 (08/16 1121) Pulse Rate: 65 (08/16 1121)  Labs: Recent Labs    02/09/18 1547  02/09/18 2147 02/10/18 0400  02/11/18 0029  02/11/18 1516 02/11/18 2051 02/12/18 0253 02/12/18 1056  HGB 10.7*  --   --  10.0*  --  10.4*  --   --   --  10.9*  --   HCT 32.1*  --   --  29.8*  --  31.0*  --   --   --  32.8*  --   PLT 207  --   --  187  --  204  --   --   --  207  --   APTT  --    < >  --  66*   < > 61*   < >  --  116* 90* 79*  LABPROT 16.6*  --   --   --   --   --   --   --   --   --   --   INR 1.36  --   --   --   --   --   --   --   --   --   --   HEPARINUNFRC  --    < >  --  >2.20*  --  1.38*  --   --   --  0.91* 0.73*  CREATININE 4.48*  --   --  4.22*  --  4.28*  --   --   --  4.37*  --   TROPONINI <0.03  --  <0.03 <0.03  --   --   --  <0.03  --   --   --    < > = values in this interval not displayed.    Estimated Creatinine Clearance: 11.5 mL/min (A) (by C-G formula based on SCr of 4.37 mg/dL (H)).   Medical History: Past Medical History:  Diagnosis Date  . Anemia    low iron  . Anxiety   . Arthritis   . Bronchitis   . CHF (congestive heart failure) ([place])   . CKD (chronic kidney disease) stage 4, GFR 15-29 ml/min (HCC) 03/16/2017  . Coronary artery disease   . Depression   . Diabetes mellitus without complication ([place])   . Diet-controlled diabetes mellitus ([place])   . Ganglion cyst    left wrist  . GERD (gastroesophageal reflux disease)   . Headache    chronic headaches  . Heart failure  ([place])   . Hyperlipidemia   . Hypertension   . Hypothyroidism   . Mitral regurgitation   . Myocardial infarction ([place])    3x last one 2008  . PAF (paroxysmal atrial fibrillation) ([place])   . Pneumonia    x 2  . Stroke [facility])    no lasting residual - ? 2014  . Umbilical hernia    Assessment:  53 yoF on apixaban for AFib admitted for cardiac surgery evaluation. Pharmacy to bridge with IV heparin while awaiting recommendation. Heparin level remains falsely elevated due to apixaban influence,  aPTT is within goal range.  Goal of Therapy:  Heparin level 0.3-0.7 units/ml aPTT 66-102 seconds Monitor platelets by anticoagulation protocol: Yes   Plan:  Cont heparin at 950 units/hr Daily aPTT, heparin level, CBC  Arrie Senate, PharmD, BCPS Clinical Pharmacist [phone] Please check AMION for all [facility] numbers 02/12/2018

## 2018-02-12 NOTE — Progress Notes (Signed)
Patient ID: Jocelyn Hill, female   DOB: Jan 03, 1951, 67 y.o.   MRN: [id number]     Advanced Heart Failure Rounding Note  PCP-Cardiologist: No primary care provider on file.   Subjective:    She had some chest pain yesterday but still walked in halls.  Chest pain resolved today, now main complaint is her chronic back pain. SBP 140s-160s, running higher.   Objective:   Weight Range: 57.5 kg Body mass index is 18.72 kg/m.   Vital Signs:   Temp:  [97.4 F (36.3 C)-98 F (36.7 C)] 98 F (36.7 C) (08/16 0352) Pulse Rate:  [61-63] 63 (08/16 0352) Resp:  [9-22] 13 (08/16 0352) BP: (132-160)/(79-88) 160/88 (08/16 0352) SpO2:  [98 %-99 %] 99 % (08/16 0352) Weight:  [57.5 kg] 57.5 kg (08/16 0614) Last BM Date: 02/08/18  Weight change: Filed Weights   02/10/18 0315 02/11/18 0300 02/12/18 0614  Weight: 56.1 kg 57.2 kg 57.5 kg    Intake/Output:   Intake/Output Summary (Last 24 hours) at 02/12/2018 0757 Last data filed at 02/12/2018 0400 Gross per 24 hour  Intake 1024.24 ml  Output 1850 ml  Net -825.76 ml      Physical Exam    General: NAD, thin Neck: No JVD, no thyromegaly or thyroid nodule.  Lungs: Clear to auscultation bilaterally with normal respiratory effort. CV: Nondisplaced PMI.  Heart regular S1/S2, no S3/S4, 3/6 HSM apex.  No peripheral edema.  No carotid bruit.  Normal pedal pulses.  Abdomen: Soft, nontender, no hepatosplenomegaly, no distention.  Skin: Intact without lesions or rashes.  Neurologic: Alert and oriented x 3.  Psych: Normal affect. Extremities: No clubbing or cyanosis.  HEENT: Normal.    Telemetry   NSR in 70s (personally reviewed)  EKG    NSR, old ASMI, QTc 480 msec with PVCs (personally reviewed)  Labs    CBC Recent Labs    02/11/18 0029 02/12/18 0253  WBC 5.1 7.8  HGB 10.4* 10.9*  HCT 31.0* 32.8*  MCV 78.9 80.8  PLT 204 379   Basic Metabolic Panel Recent Labs    02/11/18 0029 02/12/18 0253  NA 140 138  K 3.9 4.4  CL 104  102  CO2 27 27  GLUCOSE 121* 169*  BUN 39* 39*  CREATININE 4.28* 4.37*  CALCIUM 9.4 9.9  PHOS 4.0 5.3*   Liver Function Tests Recent Labs    02/11/18 0029 02/12/18 0253  ALBUMIN 3.7 3.8   No results for input(s): LIPASE, AMYLASE in the last 72 hours. Cardiac Enzymes Recent Labs    02/09/18 2147 02/10/18 0400 02/11/18 1516  TROPONINI <0.03 <0.03 <0.03    BNP: BNP (last 3 results) Recent Labs    03/12/17 1400 02/09/18 1547  BNP 2,230.5* 1,320.1*    ProBNP (last 3 results) No results for input(s): PROBNP in the last 8760 hours.   D-Dimer No results for input(s): DDIMER in the last 72 hours. Hemoglobin A1C No results for input(s): HGBA1C in the last 72 hours. Fasting Lipid Panel No results for input(s): CHOL, HDL, LDLCALC, TRIG, CHOLHDL, LDLDIRECT in the last 72 hours. Thyroid Function Tests No results for input(s): TSH, T4TOTAL, T3FREE, THYROIDAB in the last 72 hours.  Invalid input(s): FREET3  Other results:   Imaging    No results found.   Medications:     Scheduled Medications: . amLODipine  10 mg Oral Daily  . aspirin EC  81 mg Oral Daily  . atorvastatin  40 mg Oral Daily  . B-complex with vitamin  C  1 tablet Oral Daily  . buPROPion  100 mg Oral TID AC  . busPIRone  15 mg Oral BID  . calcitRIOL  0.5 mcg Oral Daily  . carvedilol  25 mg Oral BID PC  . DULoxetine  30 mg Oral Daily  . feeding supplement (GLUCERNA SHAKE)  237 mL Oral TID BM  . furosemide  120 mg Oral BID  . gabapentin  300 mg Oral QHS  . hydrALAZINE  75 mg Oral Q8H  . insulin aspart  0-9 Units Subcutaneous TID WC  . isosorbide mononitrate  120 mg Oral Daily  . levothyroxine  112 mcg Oral QAC breakfast  . montelukast  10 mg Oral Daily  . pantoprazole  40 mg Oral Daily  . potassium chloride SA  40 mEq Oral Daily  . ranolazine  500 mg Oral BID  . sodium chloride flush  3 mL Intravenous Q12H    Infusions: . sodium chloride    . heparin 950 Units/hr (02/12/18 0300)     PRN Medications: sodium chloride, acetaminophen, albuterol, ALPRAZolam, morphine injection, ondansetron (ZOFRAN) IV, ondansetron, oxyCODONE, sodium chloride flush   Assessment/Plan   1. CAD:  LHC in 1/19 showed severe ostial LCx disease and moderate LAD disease. She will need CABG with MVR. Having more frequent CP with associated SOB over the last week, resolves with 1-2 SL nitro.  She is now having several episodes/day.  She was admitted from clinic on 8/13 due to chest pain at rest in clinic that was prolonged. She has been started on NTG gtt with improvement in CP pattern.  Also in discussion with pharmacy, I added ranolazine. Troponin negative and ECG unchanged, concern for unstable angina at admission.  - Continue ASA 81 and atorvastatin 40 mg daily. - She is now on heparin gtt, will continue.   - Continue amlodipine 10 mg daily and Coreg 25 mg BID for angina.  - She has been transitioned from NTG gtt to Imdur 120 mg daily.     - Added ranolazine 500 mg bid.  Discussed with pharmacy, not well studied with CKD IV but not contra-indicated.  Will follow ECG daily for QT interval => QTc 480 msec today, keep < 500.   - Seen by Dr Roxy Manns, given worsening chest pain episodes plan to move up surgery (CABG/MVR/Maze) to early next week, waiting for exact date to be established.  Will keep her in hospital until then.  2. 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 Stable NYHA class III dyspnea.  This is complicated by CKD stage IV. Volume status looks ok.  - Ultimately, needs mitral valve fixed to keep her out of CHF.  -Continue Lasix 120 mg po bid. 3. CKD Stage IV: Creatinine stable at 4.37 today.  Renal following, will need perm cath placed when she has cardiac surgery.  4. HTN:BP running higher.  She was on hydralazine 100 bid at home, will increase to 75 mg tid here.  She remains on Coreg and amlodipine.  5.  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  Confirmed on 4/19 TEE.  Plan for MVR next week. 6. Atrial fibrillation: Paroxysmal.  NSR today. She had a spontaneous RP hemorrhage in 9/18 but has been back on Eliquis.   - Eliquis held and heparin gtt started with active chest pain and plan for surgery next week.  - She is off amiodarone due to h/o junctional bradycardia.  - Maze planned with MVR.   7. Mobilize with PT/OT.  Length of Stay: 3  [name], MD  02/12/2018, 7:57 AM  Advanced Heart Failure Team Pager [phone] (M-F; 7a - 4p)  Please contact [facility] for night-coverage after hours (4p -7a ) and weekends on [email or web address]

## 2018-02-12 NOTE — Progress Notes (Signed)
CARDIAC REHAB PHASE I   Checked on pt. Pt states she is very nauseated today. Pt given crackers and diet ginger ale. Did not have any questions re pre-op ed. Will continue to follow.  [phone] Rufina Falco, RN BSN 02/12/2018 3:21 PM

## 2018-02-13 DIAGNOSIS — I498 Other specified cardiac arrhythmias: Secondary | ICD-10-CM

## 2018-02-13 LAB — RENAL FUNCTION PANEL
Albumin: 3.8 g/dL (ref 3.5–5.0)
Anion gap: 10 (ref 5–15)
BUN: 42 mg/dL — ABNORMAL HIGH (ref 8–23)
CO2: 28 mmol/L (ref 22–32)
Calcium: 10.1 mg/dL (ref 8.9–10.3)
Chloride: 99 mmol/L (ref 98–111)
Creatinine, Ser: 4.24 mg/dL — ABNORMAL HIGH (ref 0.44–1.00)
GFR calc Af Amer: 12 mL/min — ABNORMAL LOW (ref >60)
GFR calc non Af Amer: 10 mL/min — ABNORMAL LOW (ref >60)
Glucose, Bld: 121 mg/dL — ABNORMAL HIGH (ref 70–99)
Phosphorus: 4.9 mg/dL — ABNORMAL HIGH (ref 2.5–4.6)
Potassium: 4.5 mmol/L (ref 3.5–5.1)
Sodium: 137 mmol/L (ref 135–145)

## 2018-02-13 LAB — CBC
HCT: 32.4 % — ABNORMAL LOW (ref 36.0–46.0)
Hemoglobin: 10.8 g/dL — ABNORMAL LOW (ref 12.0–15.0)
MCH: 26.5 pg (ref 26.0–34.0)
MCHC: 33.3 g/dL (ref 30.0–36.0)
MCV: 79.6 fL (ref 78.0–100.0)
Platelets: 190 10*3/uL (ref 150–400)
RBC: 4.07 MIL/uL (ref 3.87–5.11)
RDW: 13.4 % (ref 11.5–15.5)
WBC: 4.9 10*3/uL (ref 4.0–10.5)

## 2018-02-13 LAB — GLUCOSE, CAPILLARY
Glucose-Capillary: 137 mg/dL — ABNORMAL HIGH (ref 70–99)
Glucose-Capillary: 164 mg/dL — ABNORMAL HIGH (ref 70–99)
Glucose-Capillary: 150 mg/dL — ABNORMAL HIGH (ref 70–99)
Glucose-Capillary: 177 mg/dL — ABNORMAL HIGH (ref 70–99)

## 2018-02-13 LAB — APTT: aPTT: 32 seconds (ref 24–36)

## 2018-02-13 LAB — HEPARIN LEVEL (UNFRACTIONATED)
Heparin Unfractionated: 0.21 IU/mL — ABNORMAL LOW (ref 0.30–0.70)
Heparin Unfractionated: 0.68 IU/mL (ref 0.30–0.70)

## 2018-02-13 MED ORDER — ALPRAZOLAM 0.25 MG PO TABS
0.2500 mg | ORAL_TABLET | Freq: Three times a day (TID) | ORAL | Status: DC | PRN
  Administered 2018-02-13 – 2018-02-18 (×14): 0.25 mg via ORAL
  Filled 2018-02-13 (×14): qty 1

## 2018-02-13 MED ORDER — DOCUSATE SODIUM 100 MG PO CAPS
100.0000 mg | ORAL_CAPSULE | Freq: Two times a day (BID) | ORAL | Status: DC
  Administered 2018-02-13 – 2018-02-17 (×9): 100 mg via ORAL
  Filled 2018-02-13 (×9): qty 1

## 2018-02-13 MED ORDER — POLYETHYLENE GLYCOL 3350 17 G PO PACK
17.0000 g | PACK | Freq: Once | ORAL | Status: AC
  Administered 2018-02-13: 17 g via ORAL
  Filled 2018-02-13: qty 1

## 2018-02-13 MED ORDER — BISACODYL 5 MG PO TBEC
5.0000 mg | DELAYED_RELEASE_TABLET | Freq: Every day | ORAL | Status: DC | PRN
  Administered 2018-02-13 – 2018-02-17 (×3): 5 mg via ORAL
  Filled 2018-02-13 (×3): qty 1

## 2018-02-13 NOTE — Progress Notes (Signed)
Patient ID: Jocelyn Hill, female   DOB: June 03, 1951, 67 y.o.   MRN: [id number]     Advanced Heart Failure Rounding Note  PCP-Cardiologist: No primary care provider on file.   Subjective:    Still with mild chest pain from time to time but much better compared to prior to admission.  She did some walking yesterday, denies dyspnea.  Constipated, no BM x several days.   Early this morning appears to have converted from NSR to junctional rhythm in 50s.  SBP 110s this morning.    Creatinine stable at 4.24.   Objective:   Weight Range: 56.7 kg Body mass index is 18.46 kg/m.   Vital Signs:   Temp:  [97.6 F (36.4 C)-98.2 F (36.8 C)] 97.6 F (36.4 C) (08/17 0350) Pulse Rate:  [60-65] 60 (08/17 0350) Resp:  [10-18] 18 (08/17 0635) BP: (116-148)/(65-88) 116/65 (08/17 0643) SpO2:  [92 %-98 %] 98 % (08/17 0350) Weight:  [56.7 kg] 56.7 kg (08/17 0350) Last BM Date: 02/08/18  Weight change: Filed Weights   02/11/18 0300 02/12/18 0614 02/13/18 0350  Weight: 57.2 kg 57.5 kg 56.7 kg    Intake/Output:   Intake/Output Summary (Last 24 hours) at 02/13/2018 0730 Last data filed at 02/13/2018 1448 Gross per 24 hour  Intake 1398.83 ml  Output 1900 ml  Net -501.17 ml      Physical Exam    General: NAD Neck: JVP 8 cm, no thyromegaly or thyroid nodule.  Lungs: Clear to auscultation bilaterally with normal respiratory effort. CV: Nondisplaced PMI.  Heart regular S1/S2, no S3/S4, 3/6 HSM apex.  No peripheral edema.   Abdomen: Soft, nontender, no hepatosplenomegaly, no distention.  Skin: Intact without lesions or rashes.  Neurologic: Alert and oriented x 3.  Psych: Normal affect. Extremities: No clubbing or cyanosis.  HEENT: Normal.    Telemetry   Junctional rhythm in 50s (personally reviewed)  EKG    Junctional rhythm at 56 bpm, old ASMI, QTc 461 with PVCs (personally reviewed)  Labs    CBC Recent Labs    02/12/18 0253 02/13/18 0229  WBC 7.8 4.9  HGB 10.9* 10.8*    HCT 32.8* 32.4*  MCV 80.8 79.6  PLT 207 185   Basic Metabolic Panel Recent Labs    02/12/18 0253 02/13/18 0229  NA 138 137  K 4.4 4.5  CL 102 99  CO2 27 28  GLUCOSE 169* 121*  BUN 39* 42*  CREATININE 4.37* 4.24*  CALCIUM 9.9 10.1  PHOS 5.3* 4.9*   Liver Function Tests Recent Labs    02/12/18 0253 02/13/18 0229  ALBUMIN 3.8 3.8   No results for input(s): LIPASE, AMYLASE in the last 72 hours. Cardiac Enzymes Recent Labs    02/11/18 1516  TROPONINI <0.03    BNP: BNP (last 3 results) Recent Labs    03/12/17 1400 02/09/18 1547  BNP 2,230.5* 1,320.1*    ProBNP (last 3 results) No results for input(s): PROBNP in the last 8760 hours.   D-Dimer No results for input(s): DDIMER in the last 72 hours. Hemoglobin A1C No results for input(s): HGBA1C in the last 72 hours. Fasting Lipid Panel No results for input(s): CHOL, HDL, LDLCALC, TRIG, CHOLHDL, LDLDIRECT in the last 72 hours. Thyroid Function Tests No results for input(s): TSH, T4TOTAL, T3FREE, THYROIDAB in the last 72 hours.  Invalid input(s): FREET3  Other results:   Imaging    No results found.   Medications:     Scheduled Medications: . amLODipine  10 mg  Oral Daily  . aspirin EC  81 mg Oral Daily  . atorvastatin  40 mg Oral Daily  . B-complex with vitamin C  1 tablet Oral Daily  . buPROPion  100 mg Oral TID AC  . busPIRone  15 mg Oral BID  . calcitRIOL  0.5 mcg Oral Daily  . docusate sodium  100 mg Oral BID  . DULoxetine  30 mg Oral Daily  . feeding supplement (GLUCERNA SHAKE)  237 mL Oral TID BM  . furosemide  120 mg Oral BID  . gabapentin  300 mg Oral QHS  . hydrALAZINE  75 mg Oral Q8H  . insulin aspart  0-9 Units Subcutaneous TID WC  . isosorbide mononitrate  120 mg Oral Daily  . levothyroxine  112 mcg Oral QAC breakfast  . montelukast  10 mg Oral Daily  . pantoprazole  40 mg Oral Daily  . polyethylene glycol  17 g Oral Once  . potassium chloride SA  40 mEq Oral Daily  .  ranolazine  500 mg Oral BID  . sodium chloride flush  3 mL Intravenous Q12H    Infusions: . sodium chloride    . heparin 1,050 Units/hr (02/13/18 0651)    PRN Medications: sodium chloride, acetaminophen, albuterol, ALPRAZolam, bisacodyl, morphine injection, ondansetron (ZOFRAN) IV, ondansetron, oxyCODONE, sodium chloride flush   Assessment/Plan   1. CAD:  LHC in 1/19 showed severe ostial LCx disease and moderate LAD disease. She will need CABG with MVR. Having more frequent CP with associated SOB over the last week, resolves with 1-2 SL nitro.  She is now having several episodes/day.  She was admitted from clinic on 8/13 due to chest pain at rest in clinic that was prolonged. She was started initially on NTG gtt with improvement in CP pattern.  Also in discussion with pharmacy, I added ranolazine. Troponin negative and ECG without ST/T changes, concern for unstable angina at admission.  Chest pain pattern improved, has occasional CP from time to time but less prominent than prior to admission.  - Continue ASA 81 and atorvastatin 40 mg daily. - She is now on heparin gtt, will continue.   - Continue amlodipine 10 mg daily for angina.  - She has been transitioned from NTG gtt to Imdur 120 mg daily.     - Added ranolazine 500 mg bid.  Discussed with pharmacy, not well studied with CKD IV but not contra-indicated.  Will follow ECG daily for QT interval => QTc 461 msec today, keep < 500.   - Hold Coreg for now with junctional rhythm on telemetry.  - Seen by Dr Roxy Manns, given worsening chest pain episodes plan to move up surgery (CABG/MVR/Maze) to next Thursday.  Will keep her in hospital until then.  2. 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 Stable NYHA class III dyspnea.  This is complicated by CKD stage IV. Volume status looks ok on exam today.  - Ultimately, needs mitral valve fixed to keep her out of CHF.  -Continue Lasix 120 mg po bid. 3. CKD Stage IV:  Creatinine stable at 4.24 today.  Renal following, vascular surgery consulted by Dr Roxy Manns for HD access placement.  4. HTN:BP lower today with junctional rhythm.  Hold Coreg, continue amlodipine and hydralazine for now. 5. 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 Confirmed on 4/19 TEE.  Plan for MVR Thursday.  6. Atrial fibrillation: Paroxysmal.  NSR => junctional rhythm this admission. She had a spontaneous RP hemorrhage in 9/18 but  has been back on Eliquis at home.   - Eliquis held and heparin gtt started with active chest pain and plan for surgery next week.  - She has been off amiodarone due to h/o junctional bradycardia.  - Maze planned with MVR.   7. Junctional rhythm: Started this morning, rate in 50s-60s.  She has done this in the past.   - Stop Coreg for now, follow.  8. Constipation: Wrote for laxatives.  9. Anxiety: Increased Xanax availability.  10. Mobilize with PT/OT.   Length of Stay: 4  [name], MD  02/13/2018, 7:30 AM  Advanced Heart Failure Team Pager [phone] (M-F; 7a - 4p)  Please contact [facility] for night-coverage after hours (4p -7a ) and weekends on [email or web address]

## 2018-02-13 NOTE — Progress Notes (Signed)
Heparin paused due to occluded/infiltrated IV site. IV team consulted, as IV was placed via ultrasound. MD paged to see if midline cath is appropriate. Will continue to monitor.

## 2018-02-13 NOTE — Progress Notes (Signed)
ANTICOAGULATION CONSULT NOTE - Follow-Up Consult  Pharmacy Consult for Heparin Indication: chest pain/ACS and atrial fibrillation  Allergies  Allergen Reactions  . Ace Inhibitors Anaphylaxis and Swelling  . Motrin Ib [Ibuprofen] Anaphylaxis    Patient Measurements: Height: 5\' 9"  (175.3 cm) Weight: 125 lb (56.7 kg) IBW/kg (Calculated) : 66.2 Heparin Dosing Weight: 57kg  Vital Signs: Temp: 97.8 F (36.6 C) (08/17 1157) Temp Source: Oral (08/17 1157) BP: 123/76 (08/17 1157) Pulse Rate: 47 (08/17 1157)  Labs: Recent Labs    02/11/18 0029  02/11/18 1516  02/12/18 0253 02/12/18 1056 02/13/18 0229 02/13/18 1127  HGB 10.4*  --   --   --  10.9*  --  10.8*  --   HCT 31.0*  --   --   --  32.8*  --  32.4*  --   PLT 204  --   --   --  207  --  190  --   APTT 61*   < >  --    < > 90* 79* 32  --   HEPARINUNFRC 1.38*  --   --   --  0.91* 0.73* 0.21* 0.68  CREATININE 4.28*  --   --   --  4.37*  --  4.24*  --   TROPONINI  --   --  <0.03  --   --   --   --   --    < > = values in this interval not displayed.    Estimated Creatinine Clearance: 11.7 mL/min (A) (by C-G formula based on SCr of 4.24 mg/dL (H)).  Assessment:  67 yo female with h/o Afib, apixaban on hold while awaiting surgery. Previously monitoring based on aPTT due to interaction between apixaban and heparin level. Apixaban likely out of system at this time, will switch to monitoring heparin based on heparin levels.  HL therapeutic at 0.68 on heparin 1050 units/hr. H/H stable, but low. No signs/symptoms of bleeding noted nor issues reported with infusion per nursing.   Goal of Therapy:  Heparin level 0.3-0.7 Monitor platelets by anticoagulation protocol: Yes   Plan:  Continue heparin 1050 units/hr Check anti-Xa level daily while on heparin Continue to monitor H&H and platelets   Claiborne Billings, PharmD PGY2 Cardiology Pharmacy Resident Phone [phone] 02/13/2018 1:05 PM

## 2018-02-13 NOTE — Progress Notes (Signed)
ANTICOAGULATION CONSULT NOTE - Follow-Up Consult  Pharmacy Consult for Heparin Indication: chest pain/ACS and atrial fibrillation  Allergies  Allergen Reactions  . Ace Inhibitors Anaphylaxis and Swelling  . Motrin Ib [Ibuprofen] Anaphylaxis    Patient Measurements: Height: 5\' 9"  (175.3 cm) Weight: 125 lb (56.7 kg) IBW/kg (Calculated) : 66.2 Heparin Dosing Weight: 57kg  Vital Signs: Temp: 97.6 F (36.4 C) (08/17 0350) Temp Source: Oral (08/17 0350) BP: 119/83 (08/17 0350) Pulse Rate: 60 (08/17 0350)  Labs: Recent Labs    02/11/18 0029  02/11/18 1516  02/12/18 0253 02/12/18 1056 02/13/18 0229  HGB 10.4*  --   --   --  10.9*  --  10.8*  HCT 31.0*  --   --   --  32.8*  --  32.4*  PLT 204  --   --   --  207  --  190  APTT 61*   < >  --    < > 90* 79* 32  HEPARINUNFRC 1.38*  --   --   --  0.91* 0.73* 0.21*  CREATININE 4.28*  --   --   --  4.37*  --  4.24*  TROPONINI  --   --  <0.03  --   --   --   --    < > = values in this interval not displayed.    Estimated Creatinine Clearance: 11.7 mL/min (A) (by C-G formula based on SCr of 4.24 mg/dL (H)).  Assessment:  67 yo Female with h/o Afib, Eliquis on hold while awaiting surgery, for heparin   Goal of Therapy:  Heparin level 0.3-0.7 Monitor platelets by anticoagulation protocol: Yes   Plan:  Increase Heparin 1050 units/hr Check heparin level in 8 hours.   Phillis Knack, PharmD, BCPS  02/13/2018

## 2018-02-14 DIAGNOSIS — I340 Nonrheumatic mitral (valve) insufficiency: Secondary | ICD-10-CM

## 2018-02-14 DIAGNOSIS — R079 Chest pain, unspecified: Secondary | ICD-10-CM

## 2018-02-14 DIAGNOSIS — I2510 Atherosclerotic heart disease of native coronary artery without angina pectoris: Secondary | ICD-10-CM

## 2018-02-14 DIAGNOSIS — N184 Chronic kidney disease, stage 4 (severe): Secondary | ICD-10-CM

## 2018-02-14 LAB — CBC
HCT: 34.5 % — ABNORMAL LOW (ref 36.0–46.0)
Hemoglobin: 11.7 g/dL — ABNORMAL LOW (ref 12.0–15.0)
MCH: 27.1 pg (ref 26.0–34.0)
MCHC: 33.9 g/dL (ref 30.0–36.0)
MCV: 79.9 fL (ref 78.0–100.0)
Platelets: 206 10*3/uL (ref 150–400)
RBC: 4.32 MIL/uL (ref 3.87–5.11)
RDW: 13.8 % (ref 11.5–15.5)
WBC: 5.7 10*3/uL (ref 4.0–10.5)

## 2018-02-14 LAB — RENAL FUNCTION PANEL
Albumin: 4 g/dL (ref 3.5–5.0)
Anion gap: 11 (ref 5–15)
BUN: 45 mg/dL — ABNORMAL HIGH (ref 8–23)
CO2: 29 mmol/L (ref 22–32)
Calcium: 10.3 mg/dL (ref 8.9–10.3)
Chloride: 98 mmol/L (ref 98–111)
Creatinine, Ser: 4.52 mg/dL — ABNORMAL HIGH (ref 0.44–1.00)
GFR calc Af Amer: 11 mL/min — ABNORMAL LOW (ref >60)
GFR calc non Af Amer: 9 mL/min — ABNORMAL LOW (ref >60)
Glucose, Bld: 124 mg/dL — ABNORMAL HIGH (ref 70–99)
Phosphorus: 4.8 mg/dL — ABNORMAL HIGH (ref 2.5–4.6)
Potassium: 4.7 mmol/L (ref 3.5–5.1)
Sodium: 138 mmol/L (ref 135–145)

## 2018-02-14 LAB — HEPARIN LEVEL (UNFRACTIONATED)
Heparin Unfractionated: 1.04 IU/mL — ABNORMAL HIGH (ref 0.30–0.70)
Heparin Unfractionated: 0.82 IU/mL — ABNORMAL HIGH (ref 0.30–0.70)
Heparin Unfractionated: 0.81 IU/mL — ABNORMAL HIGH (ref 0.30–0.70)
Heparin Unfractionated: 0.47 IU/mL (ref 0.30–0.70)

## 2018-02-14 LAB — GLUCOSE, CAPILLARY
Glucose-Capillary: 130 mg/dL — ABNORMAL HIGH (ref 70–99)
Glucose-Capillary: 137 mg/dL — ABNORMAL HIGH (ref 70–99)
Glucose-Capillary: 153 mg/dL — ABNORMAL HIGH (ref 70–99)
Glucose-Capillary: 148 mg/dL — ABNORMAL HIGH (ref 70–99)

## 2018-02-14 LAB — TROPONIN I: Troponin I: <0.03 ng/mL (ref <0.03)

## 2018-02-14 MED ORDER — HEPARIN (PORCINE) IN NACL 100-0.45 UNIT/ML-% IJ SOLN
850.0000 [IU]/h | INTRAMUSCULAR | Status: AC
  Administered 2018-02-14 (×2): 950 [IU]/h via INTRAVENOUS
  Administered 2018-02-15 – 2018-02-17 (×2): 850 [IU]/h via INTRAVENOUS
  Filled 2018-02-14 (×3): qty 250

## 2018-02-14 NOTE — Progress Notes (Signed)
ANTICOAGULATION CONSULT NOTE - Follow-Up Consult  Pharmacy Consult for Heparin Indication: chest pain/ACS and atrial fibrillation  Allergies  Allergen Reactions  . Ace Inhibitors Anaphylaxis and Swelling  . Motrin Ib [Ibuprofen] Anaphylaxis    Patient Measurements: Height: 5\' 9"  (175.3 cm) Weight: 125 lb (56.7 kg) IBW/kg (Calculated) : 66.2 Heparin Dosing Weight: 57kg  Vital Signs: Temp: 97.8 F (36.6 C) (08/17 2335) Temp Source: Oral (08/17 2335) BP: 138/77 (08/17 2335) Pulse Rate: 49 (08/17 2335)  Labs: Recent Labs    02/11/18 1516  02/12/18 0253 02/12/18 1056 02/13/18 0229 02/13/18 1127 02/14/18 0253  HGB  --    < > 10.9*  --  10.8*  --  11.7*  HCT  --   --  32.8*  --  32.4*  --  34.5*  PLT  --   --  207  --  190  --  206  APTT  --    < > 90* 79* 32  --   --   HEPARINUNFRC  --    < > 0.91* 0.73* 0.21* 0.68 1.04*  CREATININE  --   --  4.37*  --  4.24*  --  4.52*  TROPONINI <0.03  --   --   --   --   --   --    < > = values in this interval not displayed.    Estimated Creatinine Clearance: 11 mL/min (A) (by C-G formula based on SCr of 4.52 mg/dL (H)).  Assessment:  67 yo female with h/o Afib, apixaban on hold while awaiting surgery. Previously monitoring based on aPTT due to interaction between apixaban and heparin level. Apixaban likely out of system at this time, will switch to monitoring heparin based on heparin levels.  HL elevated on heparin 1050 units/hr. H/H stable, but low. No signs/symptoms of bleeding noted nor issues reported with infusion per nursing.   Goal of Therapy:  Heparin level 0.3-0.7 units/mL Monitor platelets by anticoagulation protocol: Yes   Plan:  Hold heparin x 1 hr Re-start heparin at 950 units/hr at [address] [place], PharmD, [place] Pharmacist Phone: [phone]

## 2018-02-14 NOTE — Progress Notes (Signed)
ANTICOAGULATION CONSULT NOTE - Follow-Up Consult  Pharmacy Consult for Heparin Indication: chest pain/ACS and atrial fibrillation  Allergies  Allergen Reactions  . Ace Inhibitors Anaphylaxis and Swelling  . Motrin Ib [Ibuprofen] Anaphylaxis    Patient Measurements: Height: 5\' 9"  (175.3 cm) Weight: 124 lb 12.5 oz (56.6 kg) IBW/kg (Calculated) : 66.2 Heparin Dosing Weight: 57kg  Vital Signs: Temp: 98.2 F (36.8 C) (08/18 1200) Temp Source: Oral (08/18 1200) BP: 131/72 (08/18 1200) Pulse Rate: 47 (08/18 0429)  Labs: Recent Labs    02/11/18 1516  02/12/18 0253 02/12/18 1056 02/13/18 0229 02/13/18 1127 02/14/18 0253 02/14/18 1224  HGB  --    < > 10.9*  --  10.8*  --  11.7*  --   HCT  --   --  32.8*  --  32.4*  --  34.5*  --   PLT  --   --  207  --  190  --  206  --   APTT  --    < > 90* 79* 32  --   --   --   HEPARINUNFRC  --    < > 0.91* 0.73* 0.21* 0.68 1.04* 0.82*  CREATININE  --   --  4.37*  --  4.24*  --  4.52*  --   TROPONINI <0.03  --   --   --   --   --   --  <0.03   < > = values in this interval not displayed.    Estimated Creatinine Clearance: 10.9 mL/min (A) (by C-G formula based on SCr of 4.52 mg/dL (H)).  Assessment:  67 yo female with h/o Afib, apixaban on hold while awaiting surgery. Previously monitoring based on aPTT due to interaction between apixaban and heparin level. Apixaban likely out of system at this time, will switch to monitoring heparin based on heparin levels.  HL elevated at 0.82 after holding for one hour and decreasing rate to heparin 950 units/hr. H/H stable, but low. No signs/symptoms of bleeding noted nor issues reported with infusion per nursing.   Goal of Therapy:  Heparin level 0.3-0.7 units/mL Monitor platelets by anticoagulation protocol: Yes   Plan:  Decrease heparin to 850 units/hr  Check anti-Xa level in 6 hours and daily while on heparin Continue to monitor H&H and platelets   Claiborne Billings, PharmD PGY2 Cardiology  Pharmacy Resident Phone [phone] 02/14/2018 1:19 PM

## 2018-02-14 NOTE — Progress Notes (Signed)
[place] for Heparin Indication: chest pain/ACS and atrial fibrillation  Allergies  Allergen Reactions  . Ace Inhibitors Anaphylaxis and Swelling  . Motrin Ib [Ibuprofen] Anaphylaxis    Patient Measurements: Height: 5\' 9"  (175.3 cm) Weight: 124 lb 12.5 oz (56.6 kg) IBW/kg (Calculated) : 66.2 Heparin Dosing Weight: 57kg  Vital Signs: Temp: 97.9 F (36.6 C) (08/18 1941) Temp Source: Oral (08/18 1941) BP: 142/80 (08/18 2131) Pulse Rate: 85 (08/18 1941)  Labs: Recent Labs    02/12/18 0253 02/12/18 1056 02/13/18 0229  02/14/18 0253 02/14/18 1224 02/14/18 2027 02/14/18 2231  HGB 10.9*  --  10.8*  --  11.7*  --   --   --   HCT 32.8*  --  32.4*  --  34.5*  --   --   --   PLT 207  --  190  --  206  --   --   --   APTT 90* 79* 32  --   --   --   --   --   HEPARINUNFRC 0.91* 0.73* 0.21*   < > 1.04* 0.82* 0.81* 0.47  CREATININE 4.37*  --  4.24*  --  4.52*  --   --   --   TROPONINI  --   --   --   --   --  <0.03  --   --    < > = values in this interval not displayed.    Estimated Creatinine Clearance: 10.9 mL/min (A) (by C-G formula based on SCr of 4.52 mg/dL (H)).  Assessment:  67 yo female with h/o Afib, apixaban on hold while awaiting surgery, for heparin.  Goal of Therapy:  Heparin level 0.3-0.7 units/mL Monitor platelets by anticoagulation protocol: Yes   Plan:  Continue Heparin at current rate   Phillis Knack, PharmD, BCPS  02/14/2018 11:26 PM

## 2018-02-14 NOTE — Progress Notes (Signed)
Patient ID: Jocelyn Hill, female   DOB: Jan 06, 1951, 67 y.o.   MRN: [id number]     Advanced Heart Failure Rounding Note  PCP-Cardiologist: No primary care provider on file.   Subjective:    Developed junctional rhythm yesterday. Carvedilol held.   Remains on IV heparin and IV NTG.   Sitting in bed playing on her phone but complaining of CP. Asking for pain medications and xanax.   Denies dyspnea, orthopnea or PND.   Objective:   Weight Range: 56.6 kg Body mass index is 18.43 kg/m.   Vital Signs:   Temp:  [97.8 F (36.6 C)-98 F (36.7 C)] 97.9 F (36.6 C) (08/18 0749) Pulse Rate:  [47-49] 47 (08/18 0429) Resp:  [13-17] 14 (08/18 0749) BP: (119-138)/(70-77) 122/74 (08/18 0749) SpO2:  [92 %-95 %] 92 % (08/18 0749) Weight:  [56.6 kg] 56.6 kg (08/18 0429) Last BM Date: 02/08/18  Weight change: Filed Weights   02/12/18 0614 02/13/18 0350 02/14/18 0429  Weight: 57.5 kg 56.7 kg 56.6 kg    Intake/Output:   Intake/Output Summary (Last 24 hours) at 02/14/2018 1109 Last data filed at 02/14/2018 0600 Gross per 24 hour  Intake 590.16 ml  Output -  Net 590.16 ml      Physical Exam    General:  Sitting up in bed on her phone. No resp difficulty HEENT: normal Neck: supple.JVP 6-7Carotids 2+ bilat; no bruits. No lymphadenopathy or thryomegaly appreciated. Cor: PMI nondisplaced. Irregular rate & rhythm. 3/6 MR Lungs: clear Abdomen: soft, nontender, nondistended. No hepatosplenomegaly. No bruits or masses. Good bowel sounds. Extremities: no cyanosis, clubbing, rash, edema Neuro: alert & orientedx3, cranial nerves grossly intact. moves all 4 extremities w/o difficulty. Affect pleasant   Telemetry   Junctional rhythm in 50-60s with competing severe sinus bradycardia (versus retrograde p-waves) Personally reviewed   EKG    Junctional rhythm at 56 bpm, old ASMI, QTc 461 with PVCs (personally reviewed)  Labs    CBC Recent Labs    02/13/18 0229 02/14/18 0253  WBC  4.9 5.7  HGB 10.8* 11.7*  HCT 32.4* 34.5*  MCV 79.6 79.9  PLT 190 568   Basic Metabolic Panel Recent Labs    02/13/18 0229 02/14/18 0253  NA 137 138  K 4.5 4.7  CL 99 98  CO2 28 29  GLUCOSE 121* 124*  BUN 42* 45*  CREATININE 4.24* 4.52*  CALCIUM 10.1 10.3  PHOS 4.9* 4.8*   Liver Function Tests Recent Labs    02/13/18 0229 02/14/18 0253  ALBUMIN 3.8 4.0   No results for input(s): LIPASE, AMYLASE in the last 72 hours. Cardiac Enzymes Recent Labs    02/11/18 1516  TROPONINI <0.03    BNP: BNP (last 3 results) Recent Labs    03/12/17 1400 02/09/18 1547  BNP 2,230.5* 1,320.1*    ProBNP (last 3 results) No results for input(s): PROBNP in the last 8760 hours.   D-Dimer No results for input(s): DDIMER in the last 72 hours. Hemoglobin A1C No results for input(s): HGBA1C in the last 72 hours. Fasting Lipid Panel No results for input(s): CHOL, HDL, LDLCALC, TRIG, CHOLHDL, LDLDIRECT in the last 72 hours. Thyroid Function Tests No results for input(s): TSH, T4TOTAL, T3FREE, THYROIDAB in the last 72 hours.  Invalid input(s): FREET3  Other results:   Imaging    No results found.   Medications:     Scheduled Medications: . amLODipine  10 mg Oral Daily  . aspirin EC  81 mg Oral Daily  .  atorvastatin  40 mg Oral Daily  . B-complex with vitamin C  1 tablet Oral Daily  . buPROPion  100 mg Oral TID AC  . busPIRone  15 mg Oral BID  . calcitRIOL  0.5 mcg Oral Daily  . docusate sodium  100 mg Oral BID  . DULoxetine  30 mg Oral Daily  . feeding supplement (GLUCERNA SHAKE)  237 mL Oral TID BM  . furosemide  120 mg Oral BID  . gabapentin  300 mg Oral QHS  . hydrALAZINE  75 mg Oral Q8H  . insulin aspart  0-9 Units Subcutaneous TID WC  . isosorbide mononitrate  120 mg Oral Daily  . levothyroxine  112 mcg Oral QAC breakfast  . montelukast  10 mg Oral Daily  . pantoprazole  40 mg Oral Daily  . potassium chloride SA  40 mEq Oral Daily  . ranolazine  500 mg  Oral BID  . sodium chloride flush  3 mL Intravenous Q12H    Infusions: . sodium chloride    . heparin 950 Units/hr (02/14/18 0543)    PRN Medications: sodium chloride, acetaminophen, albuterol, ALPRAZolam, bisacodyl, morphine injection, ondansetron (ZOFRAN) IV, ondansetron, oxyCODONE, sodium chloride flush   Assessment/Plan   1. CAD:  LHC in 1/19 showed severe ostial LCx disease and moderate LAD disease. She will need CABG with MVR. Having more frequent CP with associated SOB over the last week, resolves with 1-2 SL nitro.  She is now having several episodes/day.  She was admitted from clinic on 8/13 due to chest pain at rest in clinic that was prolonged. She was started initially on NTG gtt with improvement in CP pattern.  Also in discussion with pharmacy, I added ranolazine. Troponin negative and ECG without ST/T changes, concern for unstable angina at admission.  Continues with CP at rest and exertion. Troponins and ECGs ok. Says only narcotics help with pain.  - Continue ASA 81 and atorvastatin 40 mg daily. - She is now on heparin gtt, NTG gtt and Ranexa. Reports frequent CP but says only pain medications make it better.Suspect possible secondary gain issue here. Will repeat troponin today.  - Continue current regimen    - On ranolazine 500 mg bid.  Dr. Aundra Dubin discussed with pharmacy, not well studied with CKD IV but not contra-indicated.  Will follow ECG daily for QT interval => QTc 445 msec today, keep < 500.   - Hold Coreg for now with junctional rhythm on telemetry.  - Seen by Dr Roxy Manns, given worsening chest pain episodes plan to move up surgery (CABG/MVR/Maze) to Thursday.  Will keep her in hospital until then.  2. 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 Stable NYHA class III dyspnea.  This is complicated by CKD stage IV. Volume status looks good on exam - Ultimately, needs mitral valve fixed to keep her out of CHF.  -Continue Lasix 120 mg po bid. -  Renal function stable 3. CKD Stage IV: Creatinine stable at 4.32 today.  Renal following, vascular surgery consulted by Dr Roxy Manns for HD access placement.  - Will likely need HD after surgery 4. HTN:BP lower (but stable) with junctional rhythm.   - Holding Coreg, continue amlodipine and hydralazine for now. 5. 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 Confirmed on 4/19 TEE.  Plan for MVR Thursday.  6. Atrial fibrillation: Paroxysmal.  NSR => developed junctional rhythm on 8/17. Carvedilol held. She had a spontaneous RP hemorrhage in 9/18 but has been back on Eliquis  at home.   - Remains on heparin (off Eliquis) due to active chest pain and plan for surgery this week.  - She has been off amiodarone due to h/o junctional bradycardia.  - Maze planned with MVR.  May need PPM 7. Junctional rhythm: Started 8/17 rate in 50s-60s.  She has done this in the past.   - Stop Coreg for now, follow.  - May need PPM 8. Constipation: Wrote for laxatives. Limit narcotics   9. Anxiety: Increased Xanax availability.  10. Mobilize with PT/OT.   Length of Stay: [place], MD  02/14/2018, 11:09 AM  Advanced Heart Failure Team Pager [phone] (M-F; 7a - 4p)  Please contact [facility] for night-coverage after hours (4p -7a ) and weekends on [email or web address]

## 2018-02-15 ENCOUNTER — Inpatient Hospital Stay (HOSPITAL_COMMUNITY): Payer: Medicare Other

## 2018-02-15 ENCOUNTER — Encounter: Payer: Medicare Other | Admitting: Thoracic Surgery (Cardiothoracic Vascular Surgery)

## 2018-02-15 DIAGNOSIS — N179 Acute kidney failure, unspecified: Secondary | ICD-10-CM

## 2018-02-15 DIAGNOSIS — I5033 Acute on chronic diastolic (congestive) heart failure: Secondary | ICD-10-CM

## 2018-02-15 LAB — GLUCOSE, CAPILLARY
Glucose-Capillary: 130 mg/dL — ABNORMAL HIGH (ref 70–99)
Glucose-Capillary: 112 mg/dL — ABNORMAL HIGH (ref 70–99)
Glucose-Capillary: 131 mg/dL — ABNORMAL HIGH (ref 70–99)
Glucose-Capillary: 125 mg/dL — ABNORMAL HIGH (ref 70–99)

## 2018-02-15 LAB — CBC
HCT: 34.4 % — ABNORMAL LOW (ref 36.0–46.0)
Hemoglobin: 11.6 g/dL — ABNORMAL LOW (ref 12.0–15.0)
MCH: 26.5 pg (ref 26.0–34.0)
MCHC: 33.7 g/dL (ref 30.0–36.0)
MCV: 78.7 fL (ref 78.0–100.0)
Platelets: 217 10*3/uL (ref 150–400)
RBC: 4.37 MIL/uL (ref 3.87–5.11)
RDW: 13.8 % (ref 11.5–15.5)
WBC: 8.1 10*3/uL (ref 4.0–10.5)

## 2018-02-15 LAB — RENAL FUNCTION PANEL
Albumin: 4.1 g/dL (ref 3.5–5.0)
Anion gap: 14 (ref 5–15)
BUN: 53 mg/dL — ABNORMAL HIGH (ref 8–23)
CO2: 26 mmol/L (ref 22–32)
Calcium: 10.2 mg/dL (ref 8.9–10.3)
Chloride: 97 mmol/L — ABNORMAL LOW (ref 98–111)
Creatinine, Ser: 4.94 mg/dL — ABNORMAL HIGH (ref 0.44–1.00)
GFR calc Af Amer: 10 mL/min — ABNORMAL LOW (ref >60)
GFR calc non Af Amer: 8 mL/min — ABNORMAL LOW (ref >60)
Glucose, Bld: 123 mg/dL — ABNORMAL HIGH (ref 70–99)
Phosphorus: 5 mg/dL — ABNORMAL HIGH (ref 2.5–4.6)
Potassium: 4.9 mmol/L (ref 3.5–5.1)
Sodium: 137 mmol/L (ref 135–145)

## 2018-02-15 LAB — HEPARIN LEVEL (UNFRACTIONATED)
Heparin Unfractionated: 0.27 IU/mL — ABNORMAL LOW (ref 0.30–0.70)
Heparin Unfractionated: 0.27 IU/mL — ABNORMAL LOW (ref 0.30–0.70)
Heparin Unfractionated: 0.48 IU/mL (ref 0.30–0.70)

## 2018-02-15 MED ORDER — SORBITOL 70 % SOLN
30.0000 mL | Freq: Once | Status: AC
  Administered 2018-02-15: 30 mL via ORAL
  Filled 2018-02-15: qty 30

## 2018-02-15 MED ORDER — SODIUM CHLORIDE 0.9% FLUSH: 10.0000 mL | INTRAVENOUS | Status: DC | PRN

## 2018-02-15 MED ORDER — HYDRALAZINE HCL 50 MG PO TABS
100.0000 mg | ORAL_TABLET | Freq: Three times a day (TID) | ORAL | Status: DC
  Administered 2018-02-15 – 2018-02-18 (×9): 100 mg via ORAL
  Filled 2018-02-15 (×9): qty 2

## 2018-02-15 MED ORDER — NITROGLYCERIN IN D5W 200-5 MCG/ML-% IV SOLN
10.0000 ug/min | INTRAVENOUS | Status: DC
  Administered 2018-02-15: 10 ug/min via INTRAVENOUS
  Filled 2018-02-15: qty 250

## 2018-02-15 MED ORDER — FUROSEMIDE 10 MG/ML IJ SOLN
160.0000 mg | Freq: Once | INTRAVENOUS | Status: AC
  Administered 2018-02-15: 160 mg via INTRAVENOUS
  Filled 2018-02-15: qty 16

## 2018-02-15 MED ORDER — SODIUM CHLORIDE 0.9% FLUSH: 10.0000 mL | Freq: Two times a day (BID) | INTRAVENOUS | Status: DC

## 2018-02-15 MED ORDER — ISOSORBIDE MONONITRATE ER 60 MG PO TB24
120.0000 mg | ORAL_TABLET | Freq: Every day | ORAL | Status: DC
  Administered 2018-02-15 – 2018-02-17 (×3): 120 mg via ORAL
  Filled 2018-02-15 (×3): qty 2

## 2018-02-15 MED ORDER — POLYETHYLENE GLYCOL 3350 17 G PO PACK: 17.0000 g | PACK | Freq: Every day | ORAL | Status: DC | PRN

## 2018-02-15 MED ORDER — ALBUTEROL SULFATE (2.5 MG/3ML) 0.083% IN NEBU
2.5000 mg | INHALATION_SOLUTION | RESPIRATORY_TRACT | Status: DC | PRN
  Administered 2018-02-15: 2.5 mg via RESPIRATORY_TRACT
  Filled 2018-02-15: qty 3

## 2018-02-15 NOTE — Care Management Note (Signed)
Case Management Note  Patient Details  Name: Jocelyn Hill MRN: [id number] Date of Birth: [birth year]  Subjective/Objective:    Pt admitted with HF - pt has CABG tentatively scheduled once medically stable                Action/Plan:   PTA independent from home.  Pt has family that will provide 24 hour supervision at discharge.  Pt has PCP and denied barriers with paying for medications.  Pt is interested in [facility] at discharge - CM provided [facility] [address] list and will follow back up with pt   Expected Discharge Date:                  Expected Discharge Plan:  Home/Self Care  In-House Referral:     Discharge planning Services  CM Consult  Post Acute Care Choice:    Choice offered to:     DME Arranged:    DME Agency:     HH Arranged:    HH Agency:     Status of Service:  In process, will continue to follow  If discussed at Long Length of Stay Meetings, dates discussed:    Additional Comments:  Maryclare Labrador, RN 02/15/2018, 2:30 PM

## 2018-02-15 NOTE — Progress Notes (Addendum)
Patient ID: Jocelyn Hill, female   DOB: 09-11-1950, 67 y.o.   MRN: [id number]     Advanced Heart Failure Rounding Note  PCP-Cardiologist: No primary care provider on file.   Subjective:    Overnight became hypoxic with increased O2 requirement. Received nebulizer, nitro drip restarted, and IV lasix given with improvement. Now back on 2 L [place]. CXR this am with mild edema.   Renal function worsening. Creatinine now 4.5-> 4.94. Renal following. May need HD prior to surgery.   Weight up 2 lbs. I/O -730 mls yesterday.   Developed junctional rhythm 8/17. Carvedilol held. She had a run of junctional rhythm 40s last night.   Remains on IV heparin and IV NTG. Having difficulty keeping PIVs.   She says CP is unchanged. It is constant. Nitro drip does not seem to make a difference. Asking for pain meds and xanax.  Troponin <0.03 yesterday.   Plan for CABG with MVR and MAZE Thursday.  Objective:   Weight Range: 57.4 kg Body mass index is 18.7 kg/m.   Vital Signs:   Temp:  [97.6 F (36.4 C)-98.2 F (36.8 C)] 97.9 F (36.6 C) (08/19 0750) Pulse Rate:  [45-85] 66 (08/19 0750) Resp:  [13-18] 13 (08/19 0750) BP: (122-160)/(69-83) 156/78 (08/19 0750) SpO2:  [84 %-98 %] 98 % (08/19 0750) Weight:  [57.4 kg] 57.4 kg (08/19 0332) Last BM Date: 02/12/18  Weight change: Filed Weights   02/13/18 0350 02/14/18 0429 02/15/18 0332  Weight: 56.7 kg 56.6 kg 57.4 kg    Intake/Output:   Intake/Output Summary (Last 24 hours) at 02/15/2018 0827 Last data filed at 02/15/2018 0600 Gross per 24 hour  Intake 967.07 ml  Output 1700 ml  Net -732.93 ml      Physical Exam    General: No resp difficulty. Thin.  HEENT: Normal anicteric  Neck: Supple. JVP to jaw. Carotids 2+ bilat; no bruits. No thyromegaly or nodule noted. Cor: PMI nondisplaced. IRR, 3/6 MR Lungs: CTAB, normal effort.no wheeze Abdomen: Soft, non-tender, non-distended, no HSM. No bruits or masses. +BS  Extremities: no cyanosis,  clubbing, rash, edema Neuro: alert & oriented x 3, cranial nerves grossly intact. moves all 4 extremities w/o difficulty. Affect pleasant   Telemetry   NSR 60s this am. Had a run of junctional 40s last night. Personally reviewed.   EKG    NSR 67, qtc 479. Personally reviewed.   Labs    CBC Recent Labs    02/14/18 0253 02/15/18 0404  WBC 5.7 8.1  HGB 11.7* 11.6*  HCT 34.5* 34.4*  MCV 79.9 78.7  PLT 206 233   Basic Metabolic Panel Recent Labs    02/14/18 0253 02/15/18 0404  NA 138 137  K 4.7 4.9  CL 98 97*  CO2 29 26  GLUCOSE 124* 123*  BUN 45* 53*  CREATININE 4.52* 4.94*  CALCIUM 10.3 10.2  PHOS 4.8* 5.0*   Liver Function Tests Recent Labs    02/14/18 0253 02/15/18 0404  ALBUMIN 4.0 4.1   No results for input(s): LIPASE, AMYLASE in the last 72 hours. Cardiac Enzymes Recent Labs    02/14/18 1224  TROPONINI <0.03    BNP: BNP (last 3 results) Recent Labs    03/12/17 1400 02/09/18 1547  BNP 2,230.5* 1,320.1*    ProBNP (last 3 results) No results for input(s): PROBNP in the last 8760 hours.   D-Dimer No results for input(s): DDIMER in the last 72 hours. Hemoglobin A1C No results for input(s): HGBA1C in the  last 72 hours. Fasting Lipid Panel No results for input(s): CHOL, HDL, LDLCALC, TRIG, CHOLHDL, LDLDIRECT in the last 72 hours. Thyroid Function Tests No results for input(s): TSH, T4TOTAL, T3FREE, THYROIDAB in the last 72 hours.  Invalid input(s): FREET3  Other results:   Imaging    Dg Chest Port 1 View  Result Date: 02/15/2018 CLINICAL DATA:  Initial evaluation for acute hypoxia. EXAM: PORTABLE CHEST 1 VIEW COMPARISON:  Prior radiograph from 02/10/2018. FINDINGS: Moderate cardiomegaly, stable. Mediastinal silhouette within normal limits. Aortic atherosclerosis. Lungs hyperinflated. Basilar predominant interstitial prominence with scattered Kerley B-lines, compatible with pulmonary interstitial edema. No focal infiltrates. No pleural  effusion. No pneumothorax. No acute osseous abnormality. IMPRESSION: 1. Cardiomegaly with pulmonary interstitial edema, consistent with acute CHF exacerbation. 2. Aortic atherosclerosis. Electronically Signed   By: Jeannine Boga M.D.   On: 02/15/2018 04:36     Medications:     Scheduled Medications: . amLODipine  10 mg Oral Daily  . aspirin EC  81 mg Oral Daily  . atorvastatin  40 mg Oral Daily  . B-complex with vitamin C  1 tablet Oral Daily  . buPROPion  100 mg Oral TID AC  . busPIRone  15 mg Oral BID  . calcitRIOL  0.5 mcg Oral Daily  . docusate sodium  100 mg Oral BID  . DULoxetine  30 mg Oral Daily  . feeding supplement (GLUCERNA SHAKE)  237 mL Oral TID BM  . furosemide  120 mg Oral BID  . gabapentin  300 mg Oral QHS  . hydrALAZINE  75 mg Oral Q8H  . insulin aspart  0-9 Units Subcutaneous TID WC  . isosorbide mononitrate  120 mg Oral Daily  . levothyroxine  112 mcg Oral QAC breakfast  . montelukast  10 mg Oral Daily  . pantoprazole  40 mg Oral Daily  . ranolazine  500 mg Oral BID  . sodium chloride flush  3 mL Intravenous Q12H    Infusions: . sodium chloride 250 mL (02/15/18 0521)  . heparin 850 Units/hr (02/14/18 1337)  . nitroGLYCERIN 10 mcg/min (02/15/18 0428)    PRN Medications: sodium chloride, acetaminophen, albuterol, ALPRAZolam, bisacodyl, morphine injection, ondansetron (ZOFRAN) IV, ondansetron, oxyCODONE, sodium chloride flush   Assessment/Plan   1. CAD:  LHC in 1/19 showed severe ostial LCx disease and moderate LAD disease. She will need CABG with MVR. Having more frequent CP with associated SOB over the last week, resolves with 1-2 SL nitro.  She is now having several episodes/day.  She was admitted from clinic on 8/13 due to chest pain at rest in clinic that was prolonged. She was started initially on NTG gtt with improvement in CP pattern.  Also in discussion with pharmacy, I added ranolazine. Troponin negative and ECG without ST/T changes, concern  for unstable angina at admission.  Continues with CP at rest and exertion. Troponins and ECGs ok. Says only narcotics help with pain.  - Continue ASA 81 and atorvastatin 40 mg daily. - She is now on heparin gtt nitro gtt and Ranexa. Reports frequent CP but says only pain medications make it better. Suspect possible secondary gain issue here. Troponin remains negative. DC imdur now that she is back on nitro drip.  - Continue current regimen    - On ranolazine 500 mg bid.  Dr. Aundra Dubin discussed with pharmacy, not well studied with CKD IV but not contra-indicated.  Will follow ECG daily for QT interval => QTc 479 msec today, keep < 500.   - Hold Coreg  for now with junctional rhythm on telemetry.  - Seen by Dr Roxy Manns, given worsening chest pain episodes plan to move up surgery (CABG/MVR/Maze) to Thursday.  Will keep her in hospital until then.  2. 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 Stable NYHA class III dyspnea.  This is complicated by CKD stage IV. Volume status remains elevated.  - Ultimately, needs mitral valve fixed to keep her out of CHF.  -Continue Lasix 120 mg po bid for now - Renal function worsening. Nearing need for HD. 3. CKD Stage IV: Creatinine worse, up to 4.94 today.  Renal following, vascular surgery consulted by Dr Roxy Manns for HD access placement.  - May need HD prior to surgery per Renal's note this am.  4. HTN:BP elevated overnight - Holding Coreg, continue amlodipine and hydralazine for now. SBP elevated today. Increase hydralazine to 100 mg TID. 5. 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 Confirmed on 4/19 TEE.  Plan for MVR Thursday.  6. Atrial fibrillation: Paroxysmal.  NSR => developed junctional rhythm on 8/17. Carvedilol held. She had a spontaneous RP hemorrhage in 9/18 but has been back on Eliquis at home.   - Remains on heparin (off Eliquis) due to active chest pain and plan for surgery this week.  - She has been off amiodarone due  to h/o junctional bradycardia.  - Maze planned with MVR.  May need PPM 7. Junctional rhythm: Started 8/17 rate in 50s-60s.  She has done this in the past.   - In NSR this am, but junctional briefly overnight.  - May need PPM 8. Constipation: Wrote for laxatives. Limit narcotics. No change.  9. Anxiety: Continue PRN xanax.  10. Mobilize with PT/OT. No change.  11. Difficult IV access - Will discuss with MD whether she can have central access prior to surgery.   Length of Stay: [place], NP  02/15/2018, 8:27 AM  Advanced Heart Failure Team Pager [phone] (M-F; 7a - 4p)  Please contact [facility] for night-coverage after hours (4p -7a ) and weekends on [email or web address]  Patient seen and examined with the above-signed Advanced Practice Provider and/or Housestaff. I personally reviewed laboratory data, imaging studies and relevant notes. I independently examined the patient and formulated the important aspects of the plan. I have edited the note to reflect any of my changes or salient points. I have personally discussed the plan with the patient and/or family.  Had episode of hypoxia overnight. NTG drip restarted and given IV lasix and nebulizer. Feels better but still with chronic CP. No relief from NTG. Trop <0.03 despite ongoing pain. Continues to ask for narcotics and benzos around the clock. Renal function worsening.   Can stop IV NTG as it is does not seem to be helping. Continue IV diuresis. If volume status continues to increase will need HD prior to surgery. May need trialysis cath in am.   Glori Bickers, MD  9:29 PM

## 2018-02-15 NOTE — Progress Notes (Signed)
[place] for Heparin Indication: chest pain/ACS and atrial fibrillation  Allergies  Allergen Reactions  . Ace Inhibitors Anaphylaxis and Swelling  . Motrin Ib [Ibuprofen] Anaphylaxis    Patient Measurements: Height: 5\' 9"  (175.3 cm) Weight: 126 lb 9.6 oz (57.4 kg) IBW/kg (Calculated) : 66.2 Heparin Dosing Weight: 57kg  Vital Signs: Temp: 97.9 F (36.6 C) (08/19 1223) Temp Source: Oral (08/19 1223) BP: 160/88 (08/19 1223) Pulse Rate: 72 (08/19 1223)  Labs: Recent Labs    02/13/18 0229  02/14/18 0253 02/14/18 1224  02/15/18 0404 02/15/18 0808 02/15/18 1418  HGB 10.8*  --  11.7*  --   --  11.6*  --   --   HCT 32.4*  --  34.5*  --   --  34.4*  --   --   PLT 190  --  206  --   --  217  --   --   APTT 32  --   --   --   --   --   --   --   HEPARINUNFRC 0.21*   < > 1.04* 0.82*   < > 0.27* 0.27* 0.48  CREATININE 4.24*  --  4.52*  --   --  4.94*  --   --   TROPONINI  --   --   --  <0.03  --   --   --   --    < > = values in this interval not displayed.    Estimated Creatinine Clearance: 10.2 mL/min (A) (by C-G formula based on SCr of 4.94 mg/dL (H)).  Assessment:  67 yo female with h/o Afib, apixaban on hold while awaiting surgery, for heparin.   Heparin level slightly low this AM, spoke to RN, thinks drip was turned off briefly around the time of lab collection.  Level may be falsely low.  Recollected this afternoon > back in goal range.  No overt bleeding or complications noted.  Goal of Therapy:  Heparin level 0.3-0.7 units/mL Monitor platelets by anticoagulation protocol: Yes   Plan:  Continue Heparin at current rate  Daily heparin level and CBC. F/u plans for OR.  Marguerite Olea, [facility] Clinical Pharmacist Phone [phone]  02/15/2018 3:21 PM

## 2018-02-15 NOTE — Progress Notes (Signed)
Subjective:  BP has been a little high "had a rough night"  Feels SOB and having CP- no BM for a week - 1700 UOP  Objective Vital signs in last 24 hours: Vitals:   02/14/18 2131 02/14/18 2334 02/15/18 0332 02/15/18 0636  BP: (!) 142/80 (!) 160/72 (!) 153/76 (!) 159/83  Pulse:  (!) 49 (!) 56   Resp:  18 16   Temp:  98.1 F (36.7 C) 97.8 F (36.6 C)   TempSrc:  Oral Oral   SpO2:  90% (!) 84%   Weight:   57.4 kg   Height:       Weight change: 0.825 kg  Intake/Output Summary (Last 24 hours) at 02/15/2018 3664 Last data filed at 02/15/2018 0600 Gross per 24 hour  Intake 967.07 ml  Output 1700 ml  Net -732.93 ml    Assessment/ Plan: Pt is a 67 y.o. yo female with DM, HTN with CAD, MV dz- also with advanced CKD at baseline- crt around 4 who was admitted on 02/09/2018 with accelerated angina req escalation of surgical correction- now scheduled for 8/22  Assessment/Plan: 1. Renal- advanced CKD at baseline- was referred for access but deferred secondary to cardiac pathology needing surgical correction.  There is good probability she will require dialytic support after surgery. Crt did worsen some today - now close to 5- possibility that HD needed prior to surgery ?? No absolute indications as of yet 2. HTN/Vol- BP is high- norvasc 10/ lasix 120 BID/hydralazine 75 TID/imdur- coreg held for brady- consider inc hydralazine to 100 TID- will defer to cards.  Weight/volume status pretty stable.  Also on ntg drip- could cont to titrate that to pain relief- again will defer to cards  3. Anemia- not significant at this time 4. Bones- phos is OK - no binder- last PTH 235- on calcitriol- calcium borderline will watch  5. Constipation- on colace- may need more aggressive agent 6. K-  Is creeping up as well- will hold daily supplement     Duy Lemming A    Labs: Basic Metabolic Panel: Recent Labs  Lab 02/13/18 0229 02/14/18 0253 02/15/18 0404  NA 137 138 137  K 4.5 4.7 4.9  CL 99 98  97*  CO2 28 29 26   GLUCOSE 121* 124* 123*  BUN 42* 45* 53*  CREATININE 4.24* 4.52* 4.94*  CALCIUM 10.1 10.3 10.2  PHOS 4.9* 4.8* 5.0*   Liver Function Tests: Recent Labs  Lab 02/13/18 0229 02/14/18 0253 02/15/18 0404  ALBUMIN 3.8 4.0 4.1   No results for input(s): LIPASE, AMYLASE in the last 168 hours. No results for input(s): AMMONIA in the last 168 hours. CBC: Recent Labs  Lab 02/11/18 0029 02/12/18 0253 02/13/18 0229 02/14/18 0253 02/15/18 0404  WBC 5.1 7.8 4.9 5.7 8.1  HGB 10.4* 10.9* 10.8* 11.7* 11.6*  HCT 31.0* 32.8* 32.4* 34.5* 34.4*  MCV 78.9 80.8 79.6 79.9 78.7  PLT 204 207 190 206 217   Cardiac Enzymes: Recent Labs  Lab 02/09/18 1547 02/09/18 2147 02/10/18 0400 02/11/18 1516 02/14/18 1224  TROPONINI <0.03 <0.03 <0.03 <0.03 <0.03   CBG: Recent Labs  Lab 02/13/18 2147 02/14/18 0837 02/14/18 1224 02/14/18 1742 02/14/18 2133  GLUCAP 177* 130* 137* 153* 148*    Iron Studies: No results for input(s): IRON, TIBC, TRANSFERRIN, FERRITIN in the last 72 hours. Studies/Results: Dg Chest Port 1 View  Result Date: 02/15/2018 CLINICAL DATA:  Initial evaluation for acute hypoxia. EXAM: PORTABLE CHEST 1 VIEW COMPARISON:  Prior radiograph from 02/10/2018.  FINDINGS: Moderate cardiomegaly, stable. Mediastinal silhouette within normal limits. Aortic atherosclerosis. Lungs hyperinflated. Basilar predominant interstitial prominence with scattered Kerley B-lines, compatible with pulmonary interstitial edema. No focal infiltrates. No pleural effusion. No pneumothorax. No acute osseous abnormality. IMPRESSION: 1. Cardiomegaly with pulmonary interstitial edema, consistent with acute CHF exacerbation. 2. Aortic atherosclerosis. Electronically Signed   By: Jeannine Boga M.D.   On: 02/15/2018 04:36   Medications: Infusions: . sodium chloride 250 mL (02/15/18 0521)  . heparin 850 Units/hr (02/14/18 1337)  . nitroGLYCERIN 10 mcg/min (02/15/18 0428)    Scheduled  Medications: . amLODipine  10 mg Oral Daily  . aspirin EC  81 mg Oral Daily  . atorvastatin  40 mg Oral Daily  . B-complex with vitamin C  1 tablet Oral Daily  . buPROPion  100 mg Oral TID AC  . busPIRone  15 mg Oral BID  . calcitRIOL  0.5 mcg Oral Daily  . docusate sodium  100 mg Oral BID  . DULoxetine  30 mg Oral Daily  . feeding supplement (GLUCERNA SHAKE)  237 mL Oral TID BM  . furosemide  120 mg Oral BID  . gabapentin  300 mg Oral QHS  . hydrALAZINE  75 mg Oral Q8H  . insulin aspart  0-9 Units Subcutaneous TID WC  . isosorbide mononitrate  120 mg Oral Daily  . levothyroxine  112 mcg Oral QAC breakfast  . montelukast  10 mg Oral Daily  . pantoprazole  40 mg Oral Daily  . potassium chloride SA  40 mEq Oral Daily  . ranolazine  500 mg Oral BID  . sodium chloride flush  3 mL Intravenous Q12H    have reviewed scheduled and prn medications.  Physical Exam: General: thin, weak- c/o CP- mild distress Heart: brady Lungs: poor effort, dec BS at bases Abdomen: soft, non tender Extremities: mild pitting edema to upper thighs    02/15/2018,7:33 AM  LOS: 6 days

## 2018-02-15 NOTE — Progress Notes (Signed)
Physical Therapy Treatment Patient Details Name: Jocelyn Hill MRN: [id number] DOB: 1951/04/08 Today's Date: 02/15/2018    History of Present Illness 67 yo admitted from heart failure clinic with chest pain and SOB. Pt with planned CABG/MVR/MAze for 8/29 PMHx: h/o mitral regurgitation, chronic diastolic CHF, CAD s/p MI, HTN, DM2, CKD IV-V, h/o Stroke, anxiety, depression and GERD    PT Comments    Pt ambulation limited today by SOB and onset of chest pain. Pt quickly fatigued. SpO2 dec to 80% on RA, was 95% on 2Lo2 via [place]. Per pt, pt planned for the OR on Thursday for multiple procedures. Acute PT to cont to follow to progress mobility and re-assess d/c recs s/p surgery.    Follow Up Recommendations  Home health PT     Equipment Recommendations  None recommended by PT    Recommendations for Other Services       Precautions / Restrictions Precautions Precautions: Fall Precaution Comments: watch O2 Restrictions Weight Bearing Restrictions: No    Mobility  Bed Mobility Overal bed mobility: Modified Independent             General bed mobility comments: pt up in chair upon PT arrival but returned to bed without difficulty or assist  Transfers Overall transfer level: Needs assistance Equipment used: None Transfers: Sit to/from Stand Sit to Stand: Supervision         General transfer comment: increased time, pushed up from arms of chair, pulled up on grab bar in bathroom  Ambulation/Gait Ambulation/Gait assistance: Min guard Gait Distance (Feet): 120 Feet Assistive device: Rolling walker (2 wheeled) Gait Pattern/deviations: Step-through pattern;Decreased stride length Gait velocity: decreased Gait velocity interpretation: 1.31 - 2.62 ft/sec, indicative of limited community ambulator General Gait Details: pt c/o SOB and onset of chest pain, pt with 3 standing rest breaks. SpO2 was 80% on RA and 95% on 2Lo2 via [place]   Stairs             Wheelchair  Mobility    Modified Rankin (Stroke Patients Only)       Balance Overall balance assessment: History of Falls                                          Cognition Arousal/Alertness: Awake/alert Behavior During Therapy: WFL for tasks assessed/performed Overall Cognitive Status: Within Functional Limits for tasks assessed                                 General Comments: pt fatigued from not sleeping last night      Exercises      General Comments General comments (skin integrity, edema, etc.): pt assisted to the bathroom, 67 pt able to stand in bathroom and perform hygiene and switch out maxi pads in her underwear without LOB or assist      Pertinent Vitals/Pain Pain Assessment: 0-10 Pain Score: 5  Pain Location: chest pain Pain Descriptors / Indicators: Aching Pain Intervention(s): Monitored during session    Home Living                      Prior Function            PT Goals (current goals can now be found in the care plan section) Acute Rehab PT Goals Patient Stated Goal: feel better Progress towards  PT goals: Not progressing toward goals - comment(more fatigue onset, SOB)    Frequency    Min 3X/week      PT Plan Current plan remains appropriate(will need to reassess after surgery on thursday)    Co-evaluation              AM-PAC PT "6 Clicks" Daily Activity  Outcome Measure  Difficulty turning over in bed (including adjusting bedclothes, sheets and blankets)?: None Difficulty moving from lying on back to sitting on the side of the bed? : None Difficulty sitting down on and standing up from a chair with arms (e.g., wheelchair, bedside commode, etc,.)?: A Little Help needed moving to and from a bed to chair (including a wheelchair)?: A Little Help needed walking in hospital room?: A Little Help needed climbing 3-5 steps with a railing? : A Little 6 Click Score: 20    End of Session Equipment Utilized  During Treatment: Gait belt Activity Tolerance: Patient tolerated treatment well Patient left: in bed;with call bell/phone within reach(cardiac rehab present) Nurse Communication: Mobility status PT Visit Diagnosis: Other abnormalities of gait and mobility (R26.89);Muscle weakness (generalized) (M62.81)     Time: 6808-8110 PT Time Calculation (min) (ACUTE ONLY): 32 min  Charges:  $Gait Training: 8-22 mins $Therapeutic Activity: 8-22 mins                     Kittie Plater, PT, DPT Pager #: [phone] Office #: [phone]    [place] 02/15/2018, 1:21 PM

## 2018-02-15 NOTE — Progress Notes (Signed)
OT Cancellation Note  Patient Details Name: Jocelyn Hill MRN: [id number] DOB: 31-Jul-1950   Cancelled Treatment:    Reason Eval/Treat Not Completed: Patient at procedure or test/ unavailable(Lab. Will return as schedule allows.)  [place], OTR/L Acute Rehab Pager: [phone] Office: [phone] 02/15/2018, 8:09 AM

## 2018-02-15 NOTE — Progress Notes (Signed)
[place] for Heparin Indication: chest pain/ACS and atrial fibrillation  Allergies  Allergen Reactions  . Ace Inhibitors Anaphylaxis and Swelling  . Motrin Ib [Ibuprofen] Anaphylaxis    Patient Measurements: Height: 5\' 9"  (175.3 cm) Weight: 126 lb 9.6 oz (57.4 kg) IBW/kg (Calculated) : 66.2 Heparin Dosing Weight: 57kg  Vital Signs: Temp: 97.8 F (36.6 C) (08/19 0332) Temp Source: Oral (08/19 0332) BP: 159/83 (08/19 0636) Pulse Rate: 56 (08/19 0332)  Labs: Recent Labs    02/12/18 1056  02/13/18 0229  02/14/18 0253 02/14/18 1224 02/14/18 2027 02/14/18 2231 02/15/18 0404  HGB  --    < > 10.8*  --  11.7*  --   --   --  11.6*  HCT  --   --  32.4*  --  34.5*  --   --   --  34.4*  PLT  --   --  190  --  206  --   --   --  217  APTT 79*  --  32  --   --   --   --   --   --   HEPARINUNFRC 0.73*  --  0.21*   < > 1.04* 0.82* 0.81* 0.47 0.27*  CREATININE  --   --  4.24*  --  4.52*  --   --   --  4.94*  TROPONINI  --   --   --   --   --  <0.03  --   --   --    < > = values in this interval not displayed.    Estimated Creatinine Clearance: 10.2 mL/min (A) (by C-G formula based on SCr of 4.94 mg/dL (H)).  Assessment:  67 yo female with h/o Afib, apixaban on hold while awaiting surgery, for heparin.   Heparin level slightly low this AM, spoke to RN, thinks drip was turned off briefly around the time of lab collection.  Level may be falsely low.  No overt bleeding or complications noted.  Goal of Therapy:  Heparin level 0.3-0.7 units/mL Monitor platelets by anticoagulation protocol: Yes   Plan:  Continue Heparin at current rate  Will repeat heparin level in a few hours. Daily heparin level and CBC. F/u plans for OR.  Marguerite Olea, [facility] Clinical Pharmacist Phone [phone]  02/15/2018 7:52 AM

## 2018-02-15 NOTE — Evaluation (Signed)
Occupational Therapy Evaluation Patient Details Name: Jocelyn Hill MRN: [id number] DOB: May 23, 1951 Today's Date: 02/15/2018    History of Present Illness 67 yo admitted from heart failure clinic with chest pain and SOB. Pt with planned CABG/MVR/MAze for 8/29 PMHx: h/o mitral regurgitation, chronic diastolic CHF, CAD s/p MI, HTN, DM2, CKD IV-V, h/o Stroke, anxiety, depression and GERD   Clinical Impression   PTA, pt was living alone and was independent with ADLs and daughter performed IADLs. Pt currently requiring Min Guard A for LB ADLs and functional mobility. Pt with single LOB and required Min A for recovery. Pt presenting with chest pain and decreased strength, balance, and activity tolerance. VSS throughout session. Pt reporting nausea; RN notified. Pt reports she is planning for multiple procedures on Thursday. Pt would benefit from further acute OT to facilitate safe dc. Pending pt progress, recommend dc to home with HHOT for further OT to optimize safety, independence with ADLs, and return to PLOF.      Follow Up Recommendations  Home health OT;Supervision/Assistance - 24 hour    Equipment Recommendations  3 in 1 bedside commode    Recommendations for Other Services PT consult     Precautions / Restrictions Precautions Precautions: Fall Precaution Comments: watch O2 Restrictions Weight Bearing Restrictions: No      Mobility Bed Mobility Overal bed mobility: Needs Assistance Bed Mobility: Supine to Sit     Supine to sit: Supervision;HOB elevated     General bed mobility comments: supervision for safety  Transfers Overall transfer level: Needs assistance Equipment used: None Transfers: Sit to/from Stand Sit to Stand: Min guard;Min assist         General transfer comment: Min Guard A for safety. Pt with posterior lean and LOB requiring Min A for recovery    Balance Overall balance assessment: History of Falls                                          ADL either performed or assessed with clinical judgement   ADL Overall ADL's : Needs assistance/impaired Eating/Feeding: Set up;Sitting   Grooming: Min guard;Standing Grooming Details (indicate cue type and reason): Min Guard A for safety in standing Upper Body Bathing: Supervision/ safety;Sitting   Lower Body Bathing: Min guard;Sit to/from stand   Upper Body Dressing : Supervision/safety   Lower Body Dressing: Min guard;Sit to/from stand   Toilet Transfer: Min guard;Ambulation(Simulated to recliner)           Functional mobility during ADLs: Min guard;Minimal assistance General ADL Comments: Pt performing ADLs and funcitonal mobilty with Supervision-Min Guard A. Pt with single LOB when standing from EOB (posterior lean) and required Min A for balance. Pt reporting nausea during session; RN notified     Vision         Perception     Praxis      Pertinent Vitals/Pain Pain Assessment: 0-10 Pain Score: 8  Pain Location: chest pain Pain Descriptors / Indicators: Aching Pain Intervention(s): Monitored during session;Repositioned     Hand Dominance Right   Extremity/Trunk Assessment Upper Extremity Assessment Upper Extremity Assessment: Generalized weakness   Lower Extremity Assessment Lower Extremity Assessment: Generalized weakness   Cervical / Trunk Assessment Cervical / Trunk Assessment: Normal   Communication Communication Communication: No difficulties   Cognition Arousal/Alertness: Awake/alert Behavior During Therapy: WFL for tasks assessed/performed Overall Cognitive Status: Within Functional Limits for tasks assessed  General Comments: pt fatigued from not sleeping last night   General Comments  VSS. 70s for HR. SpO2 98% on RA. BP 170/91 at end of session    Exercises     Shoulder Instructions      Home Living Family/patient expects to be discharged to:: Private residence Living  Arrangements: Alone Available Help at Discharge: Family;Available 24 hours/day Type of Home: Apartment Home Access: Level entry     Home Layout: One level     Bathroom Shower/Tub: Teacher, early years/pre: Standard     Home Equipment: Environmental consultant - 2 wheels   Additional Comments: Pt reporting her daughter is able to stay at [employer]      Prior Functioning/Environment Level of Independence: Needs assistance  Gait / Transfers Assistance Needed: walks without AD ADL's / Homemaking Assistance Needed: daughter does the cooking, cleaning, grocery shopping   Comments: daughter or grandkids can stay with pt. Drives short distances        OT Problem List: Decreased activity tolerance;Impaired balance (sitting and/or standing);Decreased strength;Decreased coordination;Decreased knowledge of use of DME or AE;Decreased knowledge of precautions;Pain      OT Treatment/Interventions: Self-care/ADL training;Therapeutic exercise;Energy conservation;DME and/or AE instruction;Therapeutic activities;Patient/family education    OT Goals(Current goals can be found in the care plan section) Acute Rehab OT Goals Patient Stated Goal: feel better OT Goal Formulation: With patient Time For Goal Achievement: 03/01/18 Potential to Achieve Goals: Good ADL Goals Pt Will Perform Grooming: with modified independence;standing Pt Will Perform Lower Body Dressing: with modified independence;sit to/from stand Pt Will Transfer to Toilet: with modified independence;ambulating;regular height toilet Pt Will Perform Toileting - Clothing Manipulation and hygiene: with modified independence;sit to/from stand Pt Will Perform Tub/Shower Transfer: Tub transfer;with supervision;3 in 1;ambulating  OT Frequency: Min 2X/week   Barriers to D/C:            Co-evaluation              AM-PAC PT "6 Clicks" Daily Activity     Outcome Measure Help from another person eating meals?: None Help from another person  taking care of personal grooming?: A Little Help from another person toileting, which includes using toliet, bedpan, or urinal?: A Little Help from another person bathing (including washing, rinsing, drying)?: A Little Help from another person to put on and taking off regular upper body clothing?: A Little Help from another person to put on and taking off regular lower body clothing?: A Little 6 Click Score: 19   End of Session Nurse Communication: Mobility status  Activity Tolerance: Patient tolerated treatment well Patient left: in chair;with call bell/phone within reach;with nursing/sitter in room  OT Visit Diagnosis: Unsteadiness on feet (R26.81);Other abnormalities of gait and mobility (R26.89);Muscle weakness (generalized) (M62.81);Pain Pain - part of body: (Chest)                Time: 7672-0947 OT Time Calculation (min): 18 min Charges:  OT General Charges $OT Visit: 1 Visit OT Evaluation $OT Eval Moderate Complexity: [place], OTR/L Acute Rehab Pager: [phone] Office: [place] 02/15/2018, 1:58 PM

## 2018-02-15 NOTE — Progress Notes (Signed)
CARDIAC REHAB PHASE I   Pt just returned to bed from ambulating with PT. Pt c/o SOB and CP 7/10. Pt placed on 2L O2, RN made aware. Pt states she feels tired and weak. Scared for upcoming surgery. Pt could not think of any questions. Will continue to follow and see as a low priority until surgery.  [id number] Rufina Falco, RN BSN 02/15/2018 11:45 AM

## 2018-02-15 NOTE — Progress Notes (Signed)
Around 2300 pt started to desat while sleeping and c/o SOB. Pt received PRN albuterol neb treatment and placed on 2L of O2 via [place]. Pt O2 sats came up to 98-100%. Around 0330 Pt started to desat again SpO2 maintaining between 82-85%. Gradually increased O2 up to 5L with no change in oxygenation. Paged MD to make aware of situation. PRN neb treatment given, started Nitro drip and one time dose of Lasix IVPB per MD. Pt O2 sats in the high 90's after neb treatment. Pt O2 slowly weaned down to 1L with Sp02 currently at 97%.  Tressie Ellis, RN

## 2018-02-16 LAB — CBC
HCT: 35.9 % — ABNORMAL LOW (ref 36.0–46.0)
Hemoglobin: 11.7 g/dL — ABNORMAL LOW (ref 12.0–15.0)
MCH: 26.1 pg (ref 26.0–34.0)
MCHC: 32.6 g/dL (ref 30.0–36.0)
MCV: 80 fL (ref 78.0–100.0)
Platelets: 228 10*3/uL (ref 150–400)
RBC: 4.49 MIL/uL (ref 3.87–5.11)
RDW: 13.8 % (ref 11.5–15.5)
WBC: 6.6 10*3/uL (ref 4.0–10.5)

## 2018-02-16 LAB — RENAL FUNCTION PANEL
Albumin: 3.9 g/dL (ref 3.5–5.0)
Anion gap: 10 (ref 5–15)
BUN: 53 mg/dL — ABNORMAL HIGH (ref 8–23)
CO2: 28 mmol/L (ref 22–32)
Calcium: 10.1 mg/dL (ref 8.9–10.3)
Chloride: 96 mmol/L — ABNORMAL LOW (ref 98–111)
Creatinine, Ser: 5.06 mg/dL — ABNORMAL HIGH (ref 0.44–1.00)
GFR calc Af Amer: 9 mL/min — ABNORMAL LOW (ref >60)
GFR calc non Af Amer: 8 mL/min — ABNORMAL LOW (ref >60)
Glucose, Bld: 168 mg/dL — ABNORMAL HIGH (ref 70–99)
Phosphorus: 5.1 mg/dL — ABNORMAL HIGH (ref 2.5–4.6)
Potassium: 4.2 mmol/L (ref 3.5–5.1)
Sodium: 134 mmol/L — ABNORMAL LOW (ref 135–145)

## 2018-02-16 LAB — GLUCOSE, CAPILLARY
Glucose-Capillary: 113 mg/dL — ABNORMAL HIGH (ref 70–99)
Glucose-Capillary: 147 mg/dL — ABNORMAL HIGH (ref 70–99)
Glucose-Capillary: 141 mg/dL — ABNORMAL HIGH (ref 70–99)
Glucose-Capillary: 126 mg/dL — ABNORMAL HIGH (ref 70–99)

## 2018-02-16 LAB — HEPARIN LEVEL (UNFRACTIONATED): Heparin Unfractionated: 0.34 IU/mL (ref 0.30–0.70)

## 2018-02-16 MED ORDER — SORBITOL 70 % SOLN
30.0000 mL | Freq: Once | Status: AC
  Administered 2018-02-16: 30 mL via ORAL
  Filled 2018-02-16: qty 30

## 2018-02-16 MED ORDER — POLYETHYLENE GLYCOL 3350 17 G PO PACK
17.0000 g | PACK | Freq: Every day | ORAL | Status: DC
  Administered 2018-02-16: 17 g via ORAL
  Filled 2018-02-16: qty 1

## 2018-02-16 NOTE — Progress Notes (Signed)
Subjective:  Pt resting.... Given her many c/o yest- did not wake- no major change- on room air- 1700 UOP but looks like not all recorded.  BP is better- hydralazine inc as well   Objective Vital signs in last 24 hours: Vitals:   02/15/18 1900 02/16/18 0025 02/16/18 0428 02/16/18 0537  BP: 130/72 (!) 164/89 (!) 153/74 (!) 143/78  Pulse: 63 65 64   Resp: 13 12 15    Temp: 97.6 F (36.4 C) 98 F (36.7 C) 98 F (36.7 C)   TempSrc: Oral Oral Oral   SpO2: 99% 98% 97%   Weight:      Height:       Weight change:   Intake/Output Summary (Last 24 hours) at 02/16/2018 0725 Last data filed at 02/15/2018 1825 Gross per 24 hour  Intake 771.12 ml  Output 1775 ml  Net -1003.88 ml    Assessment/ Plan: Pt is a 67 y.o. yo female with DM, HTN with CAD, MV dz- also with advanced CKD at baseline- crt around 4 who was admitted on 02/09/2018 with accelerated angina req escalation of surgical correction- now scheduled for 8/22  Assessment/Plan: 1. Renal- advanced CKD at baseline- was referred for access but deferred secondary to cardiac pathology needing surgical correction.  There is good probability she will require dialytic support after surgery. Crt did worsen some today - but  No absolute indications as of yet and making urine 2. HTN/Vol- BP is high- norvasc 10/ lasix 120 BID/hydralazine 100 TID/imdur- coreg held for brady-  Weight/volume status pretty stable.  Also on ntg drip-   3. Anemia- not significant at this time 4. Bones- phos is OK - no binder- last PTH 235- on calcitriol- calcium borderline will watch  5. Constipation- no addressed today 6. K-  was creeping up as well- holding daily supplement - 4.2 today- will likely need resumption of supp tomorrow    Morgane Joerger A    Labs: Basic Metabolic Panel: Recent Labs  Lab 02/14/18 0253 02/15/18 0404 02/16/18 0416  NA 138 137 134*  K 4.7 4.9 4.2  CL 98 97* 96*  CO2 29 26 28   GLUCOSE 124* 123* 168*  BUN 45* 53* 53*   CREATININE 4.52* 4.94* 5.06*  CALCIUM 10.3 10.2 10.1  PHOS 4.8* 5.0* 5.1*   Liver Function Tests: Recent Labs  Lab 02/14/18 0253 02/15/18 0404 02/16/18 0416  ALBUMIN 4.0 4.1 3.9   No results for input(s): LIPASE, AMYLASE in the last 168 hours. No results for input(s): AMMONIA in the last 168 hours. CBC: Recent Labs  Lab 02/12/18 0253 02/13/18 0229 02/14/18 0253 02/15/18 0404 02/16/18 0409  WBC 7.8 4.9 5.7 8.1 6.6  HGB 10.9* 10.8* 11.7* 11.6* 11.7*  HCT 32.8* 32.4* 34.5* 34.4* 35.9*  MCV 80.8 79.6 79.9 78.7 80.0  PLT 207 190 206 217 228   Cardiac Enzymes: Recent Labs  Lab 02/09/18 1547 02/09/18 2147 02/10/18 0400 02/11/18 1516 02/14/18 1224  TROPONINI <0.03 <0.03 <0.03 <0.03 <0.03   CBG: Recent Labs  Lab 02/14/18 2133 02/15/18 0847 02/15/18 1222 02/15/18 1731 02/15/18 2128  GLUCAP 148* 130* 112* 131* 125*    Iron Studies: No results for input(s): IRON, TIBC, TRANSFERRIN, FERRITIN in the last 72 hours. Studies/Results: Dg Chest Port 1 View  Result Date: 02/15/2018 CLINICAL DATA:  Initial evaluation for acute hypoxia. EXAM: PORTABLE CHEST 1 VIEW COMPARISON:  Prior radiograph from 02/10/2018. FINDINGS: Moderate cardiomegaly, stable. Mediastinal silhouette within normal limits. Aortic atherosclerosis. Lungs hyperinflated. Basilar predominant interstitial prominence with scattered  Kerley B-lines, compatible with pulmonary interstitial edema. No focal infiltrates. No pleural effusion. No pneumothorax. No acute osseous abnormality. IMPRESSION: 1. Cardiomegaly with pulmonary interstitial edema, consistent with acute CHF exacerbation. 2. Aortic atherosclerosis. Electronically Signed   By: Jeannine Boga M.D.   On: 02/15/2018 04:36   Medications: Infusions: . sodium chloride 250 mL (02/15/18 0521)  . heparin 850 Units/hr (02/15/18 1825)    Scheduled Medications: . amLODipine  10 mg Oral Daily  . aspirin EC  81 mg Oral Daily  . atorvastatin  40 mg Oral  Daily  . B-complex with vitamin C  1 tablet Oral Daily  . buPROPion  100 mg Oral TID AC  . busPIRone  15 mg Oral BID  . calcitRIOL  0.5 mcg Oral Daily  . docusate sodium  100 mg Oral BID  . DULoxetine  30 mg Oral Daily  . feeding supplement (GLUCERNA SHAKE)  237 mL Oral TID BM  . furosemide  120 mg Oral BID  . gabapentin  300 mg Oral QHS  . hydrALAZINE  100 mg Oral Q8H  . insulin aspart  0-9 Units Subcutaneous TID WC  . isosorbide mononitrate  120 mg Oral Daily  . levothyroxine  112 mcg Oral QAC breakfast  . montelukast  10 mg Oral Daily  . pantoprazole  40 mg Oral Daily  . ranolazine  500 mg Oral BID  . sodium chloride flush  10-40 mL Intracatheter Q12H  . sodium chloride flush  3 mL Intravenous Q12H    have reviewed scheduled and prn medications.  Physical Exam: General: thin, weak- resting Heart: brady Lungs: poor effort, dec BS at bases Abdomen: soft, non tender Extremities:  pitting edema to upper thighs    02/16/2018,7:25 AM  LOS: 7 days

## 2018-02-16 NOTE — Progress Notes (Addendum)
Patient ID: Jocelyn Hill, female   DOB: February 18, 1951, 67 y.o.   MRN: [id number]     Advanced Heart Failure Rounding Note  PCP-Cardiologist: No primary care provider on file.   Subjective:    Nitro drip stopped yesterday due to no change/relief in CP. Troponin negative 8/18.  Creatinine up to 5.0. Renal following.   Brisk UOP yesterday with -2.5 L out. No weight this am yet.   Remains on IV heparin. Now has midline for poor IV access.   No CP this morning. Walked in hallways yesterday with no CP/SOB. She is having some dizziness occasionally.   Plan for CABG with MVR and MAZE Thursday.  Objective:   Weight Range: 57.4 kg Body mass index is 18.7 kg/m.   Vital Signs:   Temp:  [97.6 F (36.4 C)-98.3 F (36.8 C)] 98.3 F (36.8 C) (08/20 0757) Pulse Rate:  [63-72] 66 (08/20 0757) Resp:  [12-15] 12 (08/20 0757) BP: (130-164)/(72-89) 153/87 (08/20 0757) SpO2:  [96 %-99 %] 96 % (08/20 0757) Last BM Date: 02/08/18  Weight change: Filed Weights   02/13/18 0350 02/14/18 0429 02/15/18 0332  Weight: 56.7 kg 56.6 kg 57.4 kg    Intake/Output:   Intake/Output Summary (Last 24 hours) at 02/16/2018 0845 Last data filed at 02/16/2018 0803 Gross per 24 hour  Intake 771.12 ml  Output 3825 ml  Net -3053.88 ml      Physical Exam    General: Thin. No resp difficulty. HEENT: Normal Neck: Supple. JVP 10-12 Carotids 2+ bilat; no bruits. No thyromegaly or nodule noted. Cor: PMI nondisplaced. IRR, 3/6 MR Lungs: CTAB, normal effort. Abdomen: Soft, non-tender, non-distended, no HSM. No bruits or masses. +BS  Extremities: No cyanosis, clubbing, or rash. R and LLE no edema.  Neuro: Alert & orientedx3, cranial nerves grossly intact. moves all 4 extremities w/o difficulty. Affect pleasant   Telemetry   NSR 60s throughout the night, now in atrial ectopic rhythm 50s. Personally reviewed.   EKG    NSR 69, qtc 482. Personally reviewed.   Labs    CBC Recent Labs    02/15/18 0404  02/16/18 0409  WBC 8.1 6.6  HGB 11.6* 11.7*  HCT 34.4* 35.9*  MCV 78.7 80.0  PLT 217 009   Basic Metabolic Panel Recent Labs    02/15/18 0404 02/16/18 0416  NA 137 134*  K 4.9 4.2  CL 97* 96*  CO2 26 28  GLUCOSE 123* 168*  BUN 53* 53*  CREATININE 4.94* 5.06*  CALCIUM 10.2 10.1  PHOS 5.0* 5.1*   Liver Function Tests Recent Labs    02/15/18 0404 02/16/18 0416  ALBUMIN 4.1 3.9   No results for input(s): LIPASE, AMYLASE in the last 72 hours. Cardiac Enzymes Recent Labs    02/14/18 1224  TROPONINI <0.03    BNP: BNP (last 3 results) Recent Labs    03/12/17 1400 02/09/18 1547  BNP 2,230.5* 1,320.1*    ProBNP (last 3 results) No results for input(s): PROBNP in the last 8760 hours.   D-Dimer No results for input(s): DDIMER in the last 72 hours. Hemoglobin A1C No results for input(s): HGBA1C in the last 72 hours. Fasting Lipid Panel No results for input(s): CHOL, HDL, LDLCALC, TRIG, CHOLHDL, LDLDIRECT in the last 72 hours. Thyroid Function Tests No results for input(s): TSH, T4TOTAL, T3FREE, THYROIDAB in the last 72 hours.  Invalid input(s): FREET3  Other results:   Imaging    No results found.   Medications:     Scheduled  Medications: . amLODipine  10 mg Oral Daily  . aspirin EC  81 mg Oral Daily  . atorvastatin  40 mg Oral Daily  . B-complex with vitamin C  1 tablet Oral Daily  . buPROPion  100 mg Oral TID AC  . busPIRone  15 mg Oral BID  . calcitRIOL  0.5 mcg Oral Daily  . docusate sodium  100 mg Oral BID  . DULoxetine  30 mg Oral Daily  . feeding supplement (GLUCERNA SHAKE)  237 mL Oral TID BM  . furosemide  120 mg Oral BID  . gabapentin  300 mg Oral QHS  . hydrALAZINE  100 mg Oral Q8H  . insulin aspart  0-9 Units Subcutaneous TID WC  . isosorbide mononitrate  120 mg Oral Daily  . levothyroxine  112 mcg Oral QAC breakfast  . montelukast  10 mg Oral Daily  . pantoprazole  40 mg Oral Daily  . ranolazine  500 mg Oral BID  . sodium  chloride flush  10-40 mL Intracatheter Q12H  . sodium chloride flush  3 mL Intravenous Q12H    Infusions: . sodium chloride 250 mL (02/15/18 0521)  . heparin 850 Units/hr (02/15/18 1825)    PRN Medications: sodium chloride, acetaminophen, albuterol, ALPRAZolam, bisacodyl, morphine injection, ondansetron (ZOFRAN) IV, ondansetron, oxyCODONE, polyethylene glycol, sodium chloride flush, sodium chloride flush   Assessment/Plan   1. CAD:  LHC in 1/19 showed severe ostial LCx disease and moderate LAD disease. She will need CABG with MVR. Having more frequent CP with associated SOB over the last week, resolves with 1-2 SL nitro.  She is now having several episodes/day.  She was admitted from clinic on 8/13 due to chest pain at rest in clinic that was prolonged. She was started initially on NTG gtt with improvement in CP pattern.  Also in discussion with pharmacy, I added ranolazine. Troponin negative and ECG without ST/T changes, concern for unstable angina at admission.  Continues with CP at rest and exertion. Troponins and ECGs ok. Says only narcotics help with pain.  - Continue ASA 81 and atorvastatin 40 mg daily. - She is now on heparin gtt nitro gtt and Ranexa. Reports frequent CP but says only pain medications make it better. Suspect possible secondary gain issue here. Troponin remains negative. Back on imdur now that she is off niro drip.  - Continue current regimen    - On ranolazine 500 mg bid.  Dr. Aundra Dubin discussed with pharmacy, not well studied with CKD IV but not contra-indicated.  Will follow ECG daily for QT interval => QTc 482 msec today, keep < 500.   - Hold Coreg for now with junctional rhythm on telemetry.  - Seen by Dr Roxy Manns, given worsening chest pain episodes plan to move up surgery (CABG/MVR/Maze) to Thursday.  Will keep her in hospital until then.  2. 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 Stable NYHA class III dyspnea.  This is complicated by CKD  stage IV. Volume status remains elevated.  - Ultimately, needs mitral valve fixed to keep her out of CHF.  -Continue Lasix 120 mg po bid for now - Renal function worsening. Creatinine 5.06 this am. Nearing need for HD. 3. CKD Stage IV: Creatinine worse, up to 4.94 today.  Renal following, vascular surgery consulted by Dr Roxy Manns for HD access placement.  - No indication for HD yet per renal's note.  4. HTN: - Holding Coreg, continue amlodipine and hydralazine for now. SBP 120-150s.  5.  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 Confirmed on 4/19 TEE.  Plan for MVR Thursday. No change.  6. Atrial fibrillation: Paroxysmal.  NSR => developed junctional rhythm on 8/17. Carvedilol held. She had a spontaneous RP hemorrhage in 9/18 but has been back on Eliquis at home.   - Remains on heparin (off Eliquis) due to active chest pain and plan for surgery this week.  - She has been off amiodarone due to h/o junctional bradycardia.  - Maze planned with MVR.  May need PPM. No change.  7. Junctional rhythm: Started 8/17 rate in 50s-60s.  She has done this in the past.   - She has sick sinus syndrome. HR 50-60s.  - May need PPM 8. Constipation: Wrote for laxatives. Limit narcotics. Received sorbitol yesterday with no BM.  9. Anxiety: Continue PRN xanax.  10. Mobilize with PT/OT. No change.  11. Difficult IV access - Now had midline.   Length of Stay: [place], NP  02/16/2018, 8:45 AM  Advanced Heart Failure Team Pager [phone] (M-F; [place])  Please contact [facility] for night-coverage after hours (4p -7a ) and weekends on [email or web address]   Patient seen and examined with the above-signed Advanced Practice Provider and/or Housestaff. I personally reviewed laboratory data, imaging studies and relevant notes. I independently examined the patient and formulated the important aspects of the plan. I have edited the note to reflect any of my changes or salient points. I have personally  discussed the plan with the patient and/or family.  CP and dyspnea improved but more agitated today and tremulous. I worry she has early uremia even though BUN only 53. Urine output OK. Lytes ok. Remains in NSR. No further junctional rhythm today. Remains on IV heparin. No bleeding.   On exam. Tremulous and a bit uncomfortable. JVP 9-10. Cor RRR 2/6 MR Lungs CTA ab soft nt Extremities warm trace edema.   Continue current therapies. Will need placement of a trialysis cath. Will d/w timing with Renal and see if she needs to start CVVHD prior to surgery.   Glori Bickers, MD  10:37 PM

## 2018-02-16 NOTE — Care Management Note (Addendum)
Case Management Note  Patient Details  Name: VENDETTA PITTINGER MRN: [id number] Date of Birth: 11-04-1950  Subjective/Objective:    Pt admitted with HF - pt has CABG tentatively scheduled once medically stable                Action/Plan:   PTA independent from home.  Pt has family that will provide 24 hour supervision at discharge.  Pt has PCP and denied barriers with paying for medications.  Pt is interested in [facility] at discharge - CM provided [facility] list and will follow back up with pt   Expected Discharge Date:                  Expected Discharge Plan:  Home/Self Care  In-House Referral:     Discharge planning Services  CM Consult  Post Acute Care Choice:    Choice offered to:  Patient  DME Arranged:    DME Agency:     HH Arranged:  PT [place]:  [place]  Status of Service:  In process, will continue to follow  If discussed at Long Length of Stay Meetings, dates discussed:    Additional Comments: Pt chose [facility] - agency contacted and referral accepted.  Pt still tentatively scheduled for CABG and MVR this week.  Daughter will provide 24 hour supervision after discharge Maryclare Labrador, RN 02/16/2018, 3:58 PM

## 2018-02-16 NOTE — Progress Notes (Signed)
[place] for Heparin Indication: chest pain/ACS and atrial fibrillation  Allergies  Allergen Reactions  . Ace Inhibitors Anaphylaxis and Swelling  . Motrin Ib [Ibuprofen] Anaphylaxis    Patient Measurements: Height: 5\' 9"  (175.3 cm) Weight: 126 lb 9.6 oz (57.4 kg) IBW/kg (Calculated) : 66.2 Heparin Dosing Weight: 57kg  Vital Signs: Temp: 98.3 F (36.8 C) (08/20 0757) Temp Source: Oral (08/20 0757) BP: 153/87 (08/20 0757) Pulse Rate: 66 (08/20 0757)  Labs: Recent Labs    02/14/18 0253 02/14/18 1224  02/15/18 0404 02/15/18 0808 02/15/18 1418 02/16/18 0409 02/16/18 0416 02/16/18 0418  HGB 11.7*  --   --  11.6*  --   --  11.7*  --   --   HCT 34.5*  --   --  34.4*  --   --  35.9*  --   --   PLT 206  --   --  217  --   --  228  --   --   HEPARINUNFRC 1.04* 0.82*   < > 0.27* 0.27* 0.48  --   --  0.34  CREATININE 4.52*  --   --  4.94*  --   --   --  5.06*  --   TROPONINI  --  <0.03  --   --   --   --   --   --   --    < > = values in this interval not displayed.    Estimated Creatinine Clearance: 9.9 mL/min (A) (by C-G formula based on SCr of 5.06 mg/dL (H)).  Assessment: 67 yo female with h/o Afib, apixaban on hold while awaiting surgery, for heparin.   Heparin level therapeutic at 0.34. CBC stable. No overt bleeding or complications noted.  Plan for CABG w/ MVR and MAZE on Thursday.  Goal of Therapy:  Heparin level 0.3-0.7 units/mL Monitor platelets by anticoagulation protocol: Yes   Plan:  Continue heparin at current rate of 850 units/hr Daily heparin level and CBC. F/u plans for OR.  Vertis Kelch, PharmD PGY1 Pharmacy Resident Phone [phone] 02/16/2018       8:54 AM

## 2018-02-16 NOTE — Progress Notes (Signed)
Nutrition Follow-up  DOCUMENTATION CODES:   Non-severe (moderate) malnutrition in context of chronic illness, Underweight  INTERVENTION:   -Continue MVI daily -Continue Glucerna Shake po TID, each supplement provides 220 kcal and 10 grams of protein -Consider initiation of bowel regimen; last BM 02/08/18 (miralax ordered 02/16/18)  NUTRITION DIAGNOSIS:   Moderate Malnutrition related to chronic illness(CHF) as evidenced by percent weight loss, energy intake < 75% for > or equal to 1 month, mild fat depletion, moderate fat depletion, mild muscle depletion, moderate muscle depletion.  Ongoing  GOAL:   Patient will meet greater than or equal to 90% of their needs  Progressing  MONITOR:   PO intake, Supplement acceptance, Labs, Weight trends, Skin, I & O's  REASON FOR ASSESSMENT:   Consult Assessment of nutrition requirement/status  ASSESSMENT:   [name] is a 67 y.o. female with h/o mitral regurgitation, chronic diastolic CHF, CAD s/p MI, long standing HTN, DM2, CKD IV-V, h/o Stroke. Anxiety, depression and GERD. Patient reported that she has had 2 MIs treated at [facility] in [place] several years ago.  Pt admitted with CHF.  Reviewed I/O's: -1 L x 24 hours and -2.3 L since admission.   Pt receiving nursing care at time of visit. Noted pt consumed 100% of breakfast meal.   Pt is still consuming MVI and Glucerna supplements well per MAR.   Per CVTS team, pt complaining of constipation; miralax ordered today.   Per nephrology notes, may require some form of renal replacement after surgery (CABG with MVR and MAZR scheduled for Thursday, 02/18/18).   Labs reviewed: Na: 134, Phos: 5.1, CBGS: 113-147 (inpatient orders for glycemic control are 0-9 units insulin aspart TID with meals).   Diet Order:   Diet Order            Diet 2 gram sodium Room service appropriate? Yes; Fluid consistency: Thin  Diet effective now              EDUCATION NEEDS:    Education needs have been addressed  Skin:  Skin Assessment: Reviewed RN Assessment  Last BM:  02/08/18  Height:   Ht Readings from Last 1 Encounters:  02/09/18 5\' 9"  (1.753 m)    Weight:   Wt Readings from Last 1 Encounters:  02/15/18 57.4 kg    Ideal Body Weight:  65.9 kg  BMI:  Body mass index is 18.7 kg/m.  Estimated Nutritional Needs:   Kcal:  1031-5945  Protein:  95-110 grams  Fluid:  1.7-1.9 L    Ivie Maese A. Jimmye Norman, RD, LDN, CDE Pager: [phone] After hours Pager: [phone]

## 2018-02-17 ENCOUNTER — Inpatient Hospital Stay (HOSPITAL_COMMUNITY): Payer: Medicare Other

## 2018-02-17 LAB — BRAIN NATRIURETIC PEPTIDE: B Natriuretic Peptide: 675.7 pg/mL — ABNORMAL HIGH (ref 0.0–100.0)

## 2018-02-17 LAB — URINALYSIS, COMPLETE (UACMP) WITH MICROSCOPIC
Bilirubin Urine: NEGATIVE
Glucose, UA: NEGATIVE mg/dL
Hgb urine dipstick: NEGATIVE
Ketones, ur: NEGATIVE mg/dL
Leukocytes, UA: NEGATIVE
Nitrite: NEGATIVE
Protein, ur: 100 mg/dL — AB
Specific Gravity, Urine: 1.006 (ref 1.005–1.030)
pH: 7 (ref 5.0–8.0)

## 2018-02-17 LAB — GLUCOSE, CAPILLARY
Glucose-Capillary: 129 mg/dL — ABNORMAL HIGH (ref 70–99)
Glucose-Capillary: 182 mg/dL — ABNORMAL HIGH (ref 70–99)
Glucose-Capillary: 105 mg/dL — ABNORMAL HIGH (ref 70–99)
Glucose-Capillary: 146 mg/dL — ABNORMAL HIGH (ref 70–99)

## 2018-02-17 LAB — PHOSPHORUS: Phosphorus: 5.2 mg/dL — ABNORMAL HIGH (ref 2.5–4.6)

## 2018-02-17 LAB — BLOOD GAS, ARTERIAL
Acid-Base Excess: 3.5 mmol/L — ABNORMAL HIGH (ref 0.0–2.0)
Bicarbonate: 27.6 mmol/L (ref 20.0–28.0)
Drawn by: 270271
FIO2: 21
O2 Saturation: 96.3 %
Patient temperature: 98.6
pCO2 arterial: 42.3 mmHg (ref 32.0–48.0)
pH, Arterial: 7.43 (ref 7.350–7.450)
pO2, Arterial: 83.7 mmHg (ref 83.0–108.0)

## 2018-02-17 LAB — COMPREHENSIVE METABOLIC PANEL
ALT: 15 U/L (ref 0–44)
AST: 16 U/L (ref 15–41)
Albumin: 4.2 g/dL (ref 3.5–5.0)
Alkaline Phosphatase: 96 U/L (ref 38–126)
Anion gap: 9 (ref 5–15)
BUN: 55 mg/dL — ABNORMAL HIGH (ref 8–23)
CO2: 30 mmol/L (ref 22–32)
Calcium: 10.1 mg/dL (ref 8.9–10.3)
Chloride: 96 mmol/L — ABNORMAL LOW (ref 98–111)
Creatinine, Ser: 5.49 mg/dL — ABNORMAL HIGH (ref 0.44–1.00)
GFR calc Af Amer: 8 mL/min — ABNORMAL LOW (ref >60)
GFR calc non Af Amer: 7 mL/min — ABNORMAL LOW (ref >60)
Glucose, Bld: 136 mg/dL — ABNORMAL HIGH (ref 70–99)
Potassium: 4.2 mmol/L (ref 3.5–5.1)
Sodium: 135 mmol/L (ref 135–145)
Total Bilirubin: 0.9 mg/dL (ref 0.3–1.2)
Total Protein: 7.3 g/dL (ref 6.5–8.1)

## 2018-02-17 LAB — CBC
HCT: 36.5 % (ref 36.0–46.0)
Hemoglobin: 12.4 g/dL (ref 12.0–15.0)
MCH: 27.1 pg (ref 26.0–34.0)
MCHC: 34 g/dL (ref 30.0–36.0)
MCV: 79.7 fL (ref 78.0–100.0)
Platelets: 238 10*3/uL (ref 150–400)
RBC: 4.58 MIL/uL (ref 3.87–5.11)
RDW: 13.9 % (ref 11.5–15.5)
WBC: 6.3 10*3/uL (ref 4.0–10.5)

## 2018-02-17 LAB — PROTIME-INR
INR: 1.09
Prothrombin Time: 14 seconds (ref 11.4–15.2)

## 2018-02-17 LAB — HEMOGLOBIN A1C
Hgb A1c MFr Bld: 6 % — ABNORMAL HIGH (ref 4.8–5.6)
Mean Plasma Glucose: 125.5 mg/dL

## 2018-02-17 LAB — PREALBUMIN: Prealbumin: 38.1 mg/dL — ABNORMAL HIGH (ref 18–38)

## 2018-02-17 LAB — HEPARIN LEVEL (UNFRACTIONATED): Heparin Unfractionated: 0.34 IU/mL (ref 0.30–0.70)

## 2018-02-17 MED ORDER — PLASMA-LYTE 148 IV SOLN
INTRAVENOUS | Status: AC
  Administered 2018-02-18: 500 mL
  Filled 2018-02-17: qty 2.5

## 2018-02-17 MED ORDER — DOPAMINE-DEXTROSE 3.2-5 MG/ML-% IV SOLN
0.0000 ug/kg/min | INTRAVENOUS | Status: AC
  Administered 2018-02-18: 3 ug/kg/min via INTRAVENOUS
  Filled 2018-02-17: qty 250

## 2018-02-17 MED ORDER — KENNESTONE BLOOD CARDIOPLEGIA (KBC) MANNITOL SYRINGE (20%, 32ML)
32.0000 mL | Freq: Once | INTRAVENOUS | Status: DC
  Filled 2018-02-17: qty 32

## 2018-02-17 MED ORDER — POTASSIUM CHLORIDE 2 MEQ/ML IV SOLN
80.0000 meq | INTRAVENOUS | Status: DC
  Filled 2018-02-17: qty 40

## 2018-02-17 MED ORDER — KENNESTONE BLOOD CARDIOPLEGIA VIAL
13.0000 mL | Freq: Once | Status: DC
  Filled 2018-02-17: qty 13

## 2018-02-17 MED ORDER — TRANEXAMIC ACID (OHS) PUMP PRIME SOLUTION
2.0000 mg/kg | INTRAVENOUS | Status: DC
  Filled 2018-02-17: qty 1.1

## 2018-02-17 MED ORDER — TRANEXAMIC ACID (OHS) BOLUS VIA INFUSION
15.0000 mg/kg | INTRAVENOUS | Status: AC
  Administered 2018-02-18: 828 mg via INTRAVENOUS
  Filled 2018-02-17: qty 828

## 2018-02-17 MED ORDER — MILRINONE LACTATE IN DEXTROSE 20-5 MG/100ML-% IV SOLN
0.1250 ug/kg/min | INTRAVENOUS | Status: DC
  Filled 2018-02-17: qty 100

## 2018-02-17 MED ORDER — CHLORHEXIDINE GLUCONATE 4 % EX LIQD
60.0000 mL | Freq: Once | CUTANEOUS | Status: AC
  Administered 2018-02-17: 4 via TOPICAL
  Filled 2018-02-17: qty 60

## 2018-02-17 MED ORDER — TRANEXAMIC ACID 1000 MG/10ML IV SOLN
1.5000 mg/kg/h | INTRAVENOUS | Status: AC
  Administered 2018-02-18: 1.5 mg/kg/h via INTRAVENOUS
  Filled 2018-02-17: qty 25

## 2018-02-17 MED ORDER — VANCOMYCIN HCL 10 G IV SOLR
1250.0000 mg | INTRAVENOUS | Status: AC
  Administered 2018-02-18: 1250 mg via INTRAVENOUS
  Filled 2018-02-17: qty 1250

## 2018-02-17 MED ORDER — METOPROLOL TARTRATE 12.5 MG HALF TABLET
12.5000 mg | ORAL_TABLET | Freq: Once | ORAL | Status: AC
  Administered 2018-02-18: 12.5 mg via ORAL
  Filled 2018-02-17: qty 1

## 2018-02-17 MED ORDER — SODIUM CHLORIDE 0.9 % IV SOLN
30.0000 ug/min | INTRAVENOUS | Status: DC
  Filled 2018-02-17: qty 2

## 2018-02-17 MED ORDER — MAGNESIUM SULFATE 50 % IJ SOLN
40.0000 meq | INTRAMUSCULAR | Status: DC
  Filled 2018-02-17: qty 9.85

## 2018-02-17 MED ORDER — SODIUM CHLORIDE 0.9 % IV SOLN
INTRAVENOUS | Status: DC
  Filled 2018-02-17: qty 30

## 2018-02-17 MED ORDER — SORBITOL 70 % SOLN: 30.0000 mL | Freq: Once | Status: DC

## 2018-02-17 MED ORDER — KENNESTONE BLOOD CARDIOPLEGIA (KBC) MANNITOL SYRINGE (20%, 32ML): 32.0000 mL | Freq: Once | INTRAVENOUS | Status: DC

## 2018-02-17 MED ORDER — SODIUM CHLORIDE 0.9 % IV SOLN
750.0000 mg | INTRAVENOUS | Status: DC
  Filled 2018-02-17: qty 750

## 2018-02-17 MED ORDER — DEXMEDETOMIDINE HCL IN NACL 400 MCG/100ML IV SOLN
0.1000 ug/kg/h | INTRAVENOUS | Status: AC
  Administered 2018-02-18: .3 ug/kg/h via INTRAVENOUS
  Filled 2018-02-17: qty 100

## 2018-02-17 MED ORDER — NITROGLYCERIN IN D5W 200-5 MCG/ML-% IV SOLN
2.0000 ug/min | INTRAVENOUS | Status: DC
  Filled 2018-02-17: qty 250

## 2018-02-17 MED ORDER — VANCOMYCIN HCL 1000 MG IV SOLR
INTRAVENOUS | Status: AC
  Administered 2018-02-18: 1000 mL
  Filled 2018-02-17: qty 1000

## 2018-02-17 MED ORDER — CHLORHEXIDINE GLUCONATE 0.12 % MT SOLN
15.0000 mL | Freq: Once | OROMUCOSAL | Status: AC
  Administered 2018-02-18: 15 mL via OROMUCOSAL
  Filled 2018-02-17: qty 15

## 2018-02-17 MED ORDER — SODIUM CHLORIDE 0.9 % IV SOLN
INTRAVENOUS | Status: AC
  Administered 2018-02-18: 1 [IU]/h via INTRAVENOUS
  Filled 2018-02-17: qty 1

## 2018-02-17 MED ORDER — BISACODYL 5 MG PO TBEC: 5.0000 mg | DELAYED_RELEASE_TABLET | Freq: Once | ORAL | Status: DC

## 2018-02-17 MED ORDER — CHLORHEXIDINE GLUCONATE 4 % EX LIQD
60.0000 mL | Freq: Once | CUTANEOUS | Status: AC
  Administered 2018-02-18: 4 via TOPICAL
  Filled 2018-02-17: qty 60

## 2018-02-17 MED ORDER — SODIUM CHLORIDE 0.9 % IV SOLN
1.5000 g | INTRAVENOUS | Status: AC
  Administered 2018-02-18: .75 g via INTRAVENOUS
  Administered 2018-02-18: 1.5 g via INTRAVENOUS
  Filled 2018-02-17: qty 1.5

## 2018-02-17 MED ORDER — EPINEPHRINE PF 1 MG/ML IJ SOLN
0.0000 ug/min | INTRAVENOUS | Status: DC
  Filled 2018-02-17: qty 4

## 2018-02-17 NOTE — Progress Notes (Signed)
[address]       [place] [address]             [phone]     CARDIOTHORACIC SURGERY PROGRESS NOTE  Subjective: Nervous about surgery.  Otherwise no complaints.  No SOB  Objective: Vital signs in last 24 hours: Temp:  [97.7 F (36.5 C)-98.6 F (37 C)] 98.3 F (36.8 C) (08/21 0800) Pulse Rate:  [51-69] 69 (08/21 0800) Cardiac Rhythm: Sinus bradycardia (08/21 0000) Resp:  [11-16] 11 (08/21 0800) BP: (140-169)/(72-92) 148/83 (08/21 0800) SpO2:  [95 %-100 %] 95 % (08/21 0800) Weight:  [55.2 kg-58 kg] 55.2 kg (08/21 0418)  Physical Exam:  Rhythm:   sinus  Breath sounds: clear  Heart sounds:  RRR  Incisions:  n/a  Abdomen:  soft  Extremities:  warm   Intake/Output from previous day: 08/20 0701 - 08/21 0700 In: 306.2 [P.O.:100; I.V.:206.2] Out: 3700 [Urine:3700] Intake/Output this shift: No intake/output data recorded.  Lab Results: Recent Labs    02/16/18 0409 02/17/18 0353  WBC 6.6 6.3  HGB 11.7* 12.4  HCT 35.9* 36.5  PLT 228 238   BMET:  Recent Labs    02/16/18 0416 02/17/18 0353  NA 134* 135  K 4.2 4.2  CL 96* 96*  CO2 28 30  GLUCOSE 168* 136*  BUN 53* 55*  CREATININE 5.06* 5.49*  CALCIUM 10.1 10.1    CBG (last 3)  Recent Labs    02/16/18 1650 02/16/18 2140 02/17/18 0803  GLUCAP 141* 126* 129*   PT/INR:   Recent Labs    02/17/18 0353  LABPROT 14.0  INR 1.09    CXR:  CHEST - 2 VIEW  COMPARISON:  02/15/2018  FINDINGS: Heart size upper normal. Resolution of interstitial edema. Lungs are now clear without infiltrate effusion or edema. COPD with hyperinflation.  IMPRESSION: Interval resolution of pulmonary edema.  Lungs are now clear.   Electronically Signed   By: Franchot Gallo M.D.   On: 02/17/2018 07:30  Assessment/Plan:  The patient was again counseled at length regarding the indications, risks and potential benefits of mitral valve replacement and coronary artery bypass grafting.  The  rationale for elective surgery has been explained, including a comparison between surgery and continued medical therapy with close follow-up.  We discussed the possibility of replacing the mitral valve using a mechanical prosthesis with the attendant need for long-term anticoagulation versus the alternative of replacing it using a bioprosthetic tissue valve with its potential for late structural valve deterioration and failure, depending upon the patient's longevity.  The patient specifically requests that if the mitral valve must be replaced that it be done using a bioprosthetic tissue valve.   The relative risks and benefits of performing a maze procedure at the time of her surgery was discussed at length, including the expected likelihood of long term freedom from recurrent symptomatic atrial fibrillation and/or atrial flutter.  The patient additionally provides consent for long term follow up following surgery including participation in the [place].  The patient understands and accepts all potential risks of surgery including but not limited to risk of death, stroke or other neurologic complication, myocardial infarction, congestive heart failure, respiratory failure, renal failure, bleeding requiring transfusion and/or reexploration, arrhythmia, infection or other wound complications, pneumonia, pleural and/or pericardial effusion, pulmonary embolus, aortic dissection or other major vascular complication, or delayed complications related to valve repair or replacement including but not limited to structural valve deterioration and failure, thrombosis, embolization, endocarditis,  or paravalvular leak.  I have personally discussed the timing of surgery with Dr. Moshe Cipro specifically with reference to the recent gradual increase in the patient's serum creatinine level.  This presumably has been related to diuretic therapy administered while the patient has remained in the hospital, and it is the  consensus opinion that there is no benefit to delaying surgery at this time.  We discussed the high likelihood that she may need initiation of hemodyalisis postoperatively, and as requested by Dr. Moshe Cipro we will plan to place a temporary hemodialysis catheter at the time of surgery.  All of their questions have been answered.   I spent in excess of 15 minutes during the conduct of this hospital encounter and >50% of this time involved direct face-to-face encounter with the patient for counseling and/or coordination of their care.     Rexene Alberts, MD 02/17/2018 9:04 AM

## 2018-02-17 NOTE — Progress Notes (Signed)
CARDIAC REHAB PHASE I   Checked in on pt. Pt anxious for surgery, but also understands how poor she has been feeling for the past week. Pt states she had not used the BR in a week. RN made aware. Will continue to follow throughout hospital stay.  [phone] Rufina Falco, RN BSN 02/17/2018 2:21 PM

## 2018-02-17 NOTE — Progress Notes (Signed)
[place] for Heparin Indication: chest pain/ACS and atrial fibrillation  Allergies  Allergen Reactions  . Ace Inhibitors Anaphylaxis and Swelling  . Motrin Ib [Ibuprofen] Anaphylaxis    Patient Measurements: Height: 5\' 9"  (175.3 cm) Weight: 121 lb 11.1 oz (55.2 kg) IBW/kg (Calculated) : 66.2 Heparin Dosing Weight: 57kg  Vital Signs: Temp: 97.7 F (36.5 C) (08/21 0418) Temp Source: Oral (08/21 0418) BP: 157/78 (08/21 0418)  Labs: Recent Labs    02/14/18 1224  02/15/18 0404  02/15/18 1418 02/16/18 0409 02/16/18 0416 02/16/18 0418 02/17/18 0353  HGB  --    < > 11.6*  --   --  11.7*  --   --  12.4  HCT  --   --  34.4*  --   --  35.9*  --   --  36.5  PLT  --   --  217  --   --  228  --   --  238  LABPROT  --   --   --   --   --   --   --   --  14.0  INR  --   --   --   --   --   --   --   --  1.09  HEPARINUNFRC 0.82*   < > 0.27*   < > 0.48  --   --  0.34 0.34  CREATININE  --   --  4.94*  --   --   --  5.06*  --  5.49*  TROPONINI <0.03  --   --   --   --   --   --   --   --    < > = values in this interval not displayed.    Estimated Creatinine Clearance: 8.8 mL/min (A) (by C-G formula based on SCr of 5.49 mg/dL (H)).  Assessment: 67 yo female with h/o Afib, apixaban on hold while awaiting surgery, for heparin.   Heparin level therapeutic at 0.34. CBC stable. No overt bleeding or complications noted.  Plan for CABG w/ MVR and MAZE on Thursday.  Goal of Therapy:  Heparin level 0.3-0.7 units/mL Monitor platelets by anticoagulation protocol: Yes   Plan:  Continue heparin at 850 units/hr Daily heparin level and CBC. F/u plans for OR.  Vertis Kelch, PharmD PGY1 Pharmacy Resident Phone [phone] 02/17/2018       7:09 AM

## 2018-02-17 NOTE — Progress Notes (Addendum)
Patient ID: Jocelyn Hill, female   DOB: 1951/05/21, 67 y.o.   MRN: [id number]     Advanced Heart Failure Rounding Note  PCP-Cardiologist: No primary care provider on file.   Subjective:    Nitro drip stopped yesterday due to no change/relief in CP. Troponin negative 8/18.  Creatinine 5.49 this am. Renal following. Brisk UOP with 3.7 L out.   Remains on IV heparin. Now has midline for poor IV access.   Feeling OK. Anxious about tomorrow. Denies SOB, orthopnea or PND. No CP currently, Had some earlier this am up into neck.   Plan for CABG with MVR and MAZE tomorrow.   Objective:   Weight Range: 55.2 kg Body mass index is 17.97 kg/m.   Vital Signs:   Temp:  [97.7 F (36.5 C)-98.6 F (37 C)] 98.3 F (36.8 C) (08/21 0800) Pulse Rate:  [51-69] 69 (08/21 0800) Resp:  [11-16] 11 (08/21 0800) BP: (140-169)/(72-92) 148/83 (08/21 0800) SpO2:  [95 %-100 %] 95 % (08/21 0800) Weight:  [55.2 kg-58 kg] 55.2 kg (08/21 0418) Last BM Date: 02/08/18  Weight change: Filed Weights   02/15/18 0332 02/16/18 1526 02/17/18 0418  Weight: 57.4 kg 58 kg 55.2 kg    Intake/Output:   Intake/Output Summary (Last 24 hours) at 02/17/2018 0815 Last data filed at 02/17/2018 0006 Gross per 24 hour  Intake 306.22 ml  Output 1200 ml  Net -893.78 ml      Physical Exam    General: Thin. NAD.  HEENT: Normal Neck: Supple. JVP 9-10 cm. Carotids 2+ bilat; no bruits. No thyromegaly or nodule noted. Cor: PMI nondisplaced. Regular, slightly brady. 3/6 MR.  General:  Well appearing. No resp difficulty Lungs: clear Abdomen: soft, nontender, nondistended. No hepatosplenomegaly. No bruits or masses. Good bowel sounds. Extremities: no cyanosis, clubbing, rash, edema Neuro: alert & orientedx3, cranial nerves grossly intact. moves all 4 extremities w/o difficulty. Affect pleasant   Telemetry   NSR 60s currently, personally reviewed.   EKG    NSR 75 bpm QTc 480 ms, personally reviewed.   Labs      CBC Recent Labs    02/16/18 0409 02/17/18 0353  WBC 6.6 6.3  HGB 11.7* 12.4  HCT 35.9* 36.5  MCV 80.0 79.7  PLT 228 563   Basic Metabolic Panel Recent Labs    02/16/18 0416 02/17/18 0353  NA 134* 135  K 4.2 4.2  CL 96* 96*  CO2 28 30  GLUCOSE 168* 136*  BUN 53* 55*  CREATININE 5.06* 5.49*  CALCIUM 10.1 10.1  PHOS 5.1* 5.2*   Liver Function Tests Recent Labs    02/16/18 0416 02/17/18 0353  AST  --  16  ALT  --  15  ALKPHOS  --  96  BILITOT  --  0.9  PROT  --  7.3  ALBUMIN 3.9 4.2   No results for input(s): LIPASE, AMYLASE in the last 72 hours. Cardiac Enzymes Recent Labs    02/14/18 1224  TROPONINI <0.03    BNP: BNP (last 3 results) Recent Labs    03/12/17 1400 02/09/18 1547 02/17/18 0353  BNP 2,230.5* 1,320.1* 675.7*    ProBNP (last 3 results) No results for input(s): PROBNP in the last 8760 hours.   D-Dimer No results for input(s): DDIMER in the last 72 hours. Hemoglobin A1C Recent Labs    02/17/18 0353  HGBA1C 6.0*   Fasting Lipid Panel No results for input(s): CHOL, HDL, LDLCALC, TRIG, CHOLHDL, LDLDIRECT in the last 72 hours. Thyroid  Function Tests No results for input(s): TSH, T4TOTAL, T3FREE, THYROIDAB in the last 72 hours.  Invalid input(s): FREET3  Other results:   Imaging    Dg Chest 2 View  Result Date: 02/17/2018 CLINICAL DATA:  Congestive heart failure EXAM: CHEST - 2 VIEW COMPARISON:  02/15/2018 FINDINGS: Heart size upper normal. Resolution of interstitial edema. Lungs are now clear without infiltrate effusion or edema. COPD with hyperinflation. IMPRESSION: Interval resolution of pulmonary edema.  Lungs are now clear. Electronically Signed   By: Franchot Gallo M.D.   On: 02/17/2018 07:30     Medications:     Scheduled Medications: . amLODipine  10 mg Oral Daily  . aspirin EC  81 mg Oral Daily  . atorvastatin  40 mg Oral Daily  . B-complex with vitamin C  1 tablet Oral Daily  . buPROPion  100 mg Oral TID AC   . busPIRone  15 mg Oral BID  . calcitRIOL  0.5 mcg Oral Daily  . docusate sodium  100 mg Oral BID  . DULoxetine  30 mg Oral Daily  . feeding supplement (GLUCERNA SHAKE)  237 mL Oral TID BM  . gabapentin  300 mg Oral QHS  . [START ON 02/18/2018] heparin-papaverine-plasmalyte irrigation   Irrigation To OR  . hydrALAZINE  100 mg Oral Q8H  . insulin aspart  0-9 Units Subcutaneous TID WC  . isosorbide mononitrate  120 mg Oral Daily  . [START ON 02/18/2018] [facility] Blood Cardioplegia (KBC) lidocaine 2% Syringe (87mL)  13 mL Intracoronary Once  . [START ON 02/18/2018] [facility] Blood Cardioplegia (KBC) lidocaine 2% Syringe (72mL)  13 mL Intracoronary Once  . [START ON 02/18/2018] [facility] Blood Cardioplegia (KBC) mannitol 20% Syringe (17mL)  32 mL Intracoronary Once  . [START ON 02/18/2018] [place] Blood Cardioplegia (KBC) mannitol 20% Syringe (55mL)  32 mL Intracoronary Once  . levothyroxine  112 mcg Oral QAC breakfast  . [START ON 02/18/2018] magnesium sulfate  40 mEq Other To OR  . montelukast  10 mg Oral Daily  . pantoprazole  40 mg Oral Daily  . polyethylene glycol  17 g Oral Daily  . [START ON 02/18/2018] potassium chloride  80 mEq Other To OR  . ranolazine  500 mg Oral BID  . sodium chloride flush  10-40 mL Intracatheter Q12H  . sodium chloride flush  3 mL Intravenous Q12H  . [START ON 02/18/2018] tranexamic acid  15 mg/kg Intravenous To OR  . [START ON 02/18/2018] tranexamic acid  2 mg/kg Intracatheter To OR  . [START ON 02/18/2018] vancomycin 1000 mg in NS (1000 ml) irrigation for Dr. Roxy Manns case   Irrigation To OR    Infusions: . sodium chloride 250 mL (02/15/18 0521)  . [START ON 02/18/2018] cefUROXime (ZINACEF)  IV    . [START ON 02/18/2018] cefUROXime (ZINACEF)  IV    . [START ON 02/18/2018] dexmedetomidine    . [START ON 02/18/2018] DOPamine    . [START ON 02/18/2018] epinephrine    . [START ON 02/18/2018] heparin 30,000 units/NS 1000 mL solution for CELLSAVER    . heparin 850  Units/hr (02/17/18 0413)  . [START ON 02/18/2018] insulin (NOVOLIN-R) infusion    . [START ON 02/18/2018] milrinone    . [START ON 02/18/2018] nitroGLYCERIN    . [START ON 02/18/2018] phenylephrine 20mg /276mL NS (0.08mg /ml) infusion    . [START ON 02/18/2018] tranexamic acid (CYKLOKAPRON) infusion ([facility])    . [START ON 02/18/2018] vancomycin      PRN Medications: sodium chloride, acetaminophen, albuterol, ALPRAZolam,  bisacodyl, morphine injection, ondansetron (ZOFRAN) IV, ondansetron, oxyCODONE, sodium chloride flush, sodium chloride flush   Assessment/Plan   1. CAD:  - LHC in 1/19 showed severe ostial LCx disease and moderate LAD disease. She will need CABG with MVR. Having more frequent CP with associated SOB over the last week, resolves with 1-2 SL nitro.  She is now having several episodes/day.  She was admitted from clinic on 8/13 due to chest pain at rest in clinic that was prolonged. She was started initially on NTG gtt with improvement in CP pattern.  Also in discussion with pharmacy, I added ranolazine. Troponin negative and ECG without ST/T changes, concern for unstable angina at admission.  Continues with CP at rest and exertion. Troponins and ECGs ok. Says only narcotics help with pain.  - Continue ASA 81 and atorvastatin 40 mg daily. - She is now on heparin gtt nitro gtt and Ranexa. Reports frequent CP but says only pain medications make it better. Suspect possible secondary gain issue here. Troponin remains negative. Back on imdur now that she is off niro drip.  - Continue current regimen    - On ranolazine 500 mg bid. Dr. Aundra Dubin discussed with pharmacy, not well studied with CKD IV but not contra-indicated.  - Will follow ECG daily for QT interval => QTc 482 msec today, keep < 500.   - Hold Coreg for now with junctional rhythm on telemetry.  - Seen by Dr Roxy Manns, given worsening chest pain episodes plan to move up surgery (CABG/MVR/Maze) to 02/18/18. 2. 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 Stable NYHA class III dyspnea.  This is complicated by CKD stage IV. Volume status remains elevated.  - Ultimately, needs mitral valve fixed to keep her out of CHF.  -Continue Lasix 120 mg po bid for now - Renal function worsening. Creatinine 5.49 this am.  - Will need HD post CABG.  3. CKD Stage IV:  - Creatinine up to 5.49 today.  - Renal following, vascular surgery consulted by Dr Roxy Manns for HD access placement.  - No indication for HD yet per renal's note.  4. HTN: - Holding Coreg - Conontinue amlodipine and hydralazine for now. SBP 140-160s  5. Mitral regurgitation:  - Severe. 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 Confirmed on 4/19 TEE.  Plan for MVR tomorrow.   6. Atrial fibrillation: Paroxysmal.  - NSR => developed junctional rhythm on 8/17. Carvedilol held. She had a spontaneous RP hemorrhage in 9/18 but has been back on Eliquis at home.   - Remains on heparin (off Eliquis) due to active chest pain and plan for surgery this week.  - She has been off amiodarone due to h/o junctional bradycardia.  - Maze planned with MVR. Tomorrow. May need PPM. No change.  7. Junctional rhythm:  - Started 8/17 rate in 50s-60s.  She has done this in the past.   - She has sick sinus syndrome. HR 50-60s.  - Regular this am.  8. Constipation:  - Written for laxatives. Limit narcotics. Received sorbitol yesterday with no BM.  9. Anxiety:  - Continue PRN xanax.  10. Deconditioning - Mobilize with PT/OT. No change.  11. Difficult IV access - Midline in place.   Length of Stay: [place], [place]  02/17/2018, 8:15 AM  Advanced Heart Failure Team Pager [phone] (M-F; 7a - 4p)  Please contact [facility] for night-coverage after hours (4p -7a ) and weekends on [email or web address]  Patient seen and examined with the above-signed  Advanced Practice Provider and/or Housestaff. I personally reviewed laboratory data, imaging studies and relevant notes. I independently examined  the patient and formulated the important aspects of the plan. I have edited the note to reflect any of my changes or salient points. I have personally discussed the plan with the patient and/or family.  Volume status improved this am. Creatinine slightly worse. Still with intermittent CP but troponin normal. Remains in NSR now. Intermittent junctional rhythm.   Dicussed with Renal and Dr. Roxy Manns. For OR tomorrow. Will need HD cath at time of surgery. Will likely need PPM post-op as well.   Glori Bickers, MD  10:06 AM

## 2018-02-17 NOTE — Progress Notes (Signed)
Subjective:  Had 3700 of UOP, no lasix- crt up again slightly.  She reports being scared for tomorrow....chest pain was better yest but is back this AM Objective Vital signs in last 24 hours: Vitals:   02/16/18 1918 02/16/18 2300 02/16/18 2340 02/17/18 0418  BP: (!) 144/72  (!) 153/77 (!) 157/78  Pulse:      Resp: 14  16 13   Temp: 98 F (36.7 C)  98.1 F (36.7 C) 97.7 F (36.5 C)  TempSrc:  Oral Oral Oral  SpO2:      Weight:    55.2 kg  Height:       Weight change:   Intake/Output Summary (Last 24 hours) at 02/17/2018 0745 Last data filed at 02/17/2018 0006 Gross per 24 hour  Intake 306.22 ml  Output 3700 ml  Net -3393.78 ml    Assessment/ Plan: Pt is a 67 y.o. yo female with DM, HTN with CAD, MV dz- also with advanced CKD at baseline- crt around 4 who was admitted on 02/09/2018 with accelerated angina req escalation of surgical correction- now scheduled for 8/22  Assessment/Plan: 1. Renal- advanced CKD at baseline- was referred for access but deferred secondary to cardiac pathology needing surgical correction.  There is good probability she will require dialytic support after surgery. Crt did worsen some today - but  No absolute indications as of yet and making good urine. Might be best to place vascath at time of surgery as likelihood is high to need dialytic support post op - will touch base with CTS 2. HTN/Vol- BP is high- norvasc 10/ lasix 120 BID- held/hydralazine 100 TID/imdur- coreg held for brady-  Weight/volume status better- weight down a lot. Given events about to take place, will not change reg right now  3. Anemia- not significant at this time 4. Bones- phos is OK - no binder- last PTH 235- on calcitriol- calcium borderline will watch  5. Constipation- not addressed today 6. K-  was creeping up  Held daily supplement - 4.2 today-     Gage Treiber A    Labs: Basic Metabolic Panel: Recent Labs  Lab 02/15/18 0404 02/16/18 0416 02/17/18 0353  NA 137 134*  135  K 4.9 4.2 4.2  CL 97* 96* 96*  CO2 26 28 30   GLUCOSE 123* 168* 136*  BUN 53* 53* 55*  CREATININE 4.94* 5.06* 5.49*  CALCIUM 10.2 10.1 10.1  PHOS 5.0* 5.1* 5.2*   Liver Function Tests: Recent Labs  Lab 02/15/18 0404 02/16/18 0416 02/17/18 0353  AST  --   --  16  ALT  --   --  15  ALKPHOS  --   --  96  BILITOT  --   --  0.9  PROT  --   --  7.3  ALBUMIN 4.1 3.9 4.2   No results for input(s): LIPASE, AMYLASE in the last 168 hours. No results for input(s): AMMONIA in the last 168 hours. CBC: Recent Labs  Lab 02/13/18 0229 02/14/18 0253 02/15/18 0404 02/16/18 0409 02/17/18 0353  WBC 4.9 5.7 8.1 6.6 6.3  HGB 10.8* 11.7* 11.6* 11.7* 12.4  HCT 32.4* 34.5* 34.4* 35.9* 36.5  MCV 79.6 79.9 78.7 80.0 79.7  PLT 190 206 217 228 238   Cardiac Enzymes: Recent Labs  Lab 02/11/18 1516 02/14/18 1224  TROPONINI <0.03 <0.03   CBG: Recent Labs  Lab 02/15/18 2128 02/16/18 0755 02/16/18 1204 02/16/18 1650 02/16/18 2140  GLUCAP 125* 113* 147* 141* 126*    Iron Studies: No results  for input(s): IRON, TIBC, TRANSFERRIN, FERRITIN in the last 72 hours. Studies/Results: Dg Chest 2 View  Result Date: 02/17/2018 CLINICAL DATA:  Congestive heart failure EXAM: CHEST - 2 VIEW COMPARISON:  02/15/2018 FINDINGS: Heart size upper normal. Resolution of interstitial edema. Lungs are now clear without infiltrate effusion or edema. COPD with hyperinflation. IMPRESSION: Interval resolution of pulmonary edema.  Lungs are now clear. Electronically Signed   By: Franchot Gallo M.D.   On: 02/17/2018 07:30   Medications: Infusions: . sodium chloride 250 mL (02/15/18 0521)  . [START ON 02/18/2018] cefUROXime (ZINACEF)  IV    . [START ON 02/18/2018] cefUROXime (ZINACEF)  IV    . [START ON 02/18/2018] dexmedetomidine    . [START ON 02/18/2018] DOPamine    . [START ON 02/18/2018] epinephrine    . [START ON 02/18/2018] heparin 30,000 units/NS 1000 mL solution for CELLSAVER    . heparin 850 Units/hr  (02/17/18 0413)  . [START ON 02/18/2018] insulin (NOVOLIN-R) infusion    . [START ON 02/18/2018] milrinone    . [START ON 02/18/2018] nitroGLYCERIN    . [START ON 02/18/2018] phenylephrine 20mg /268mL NS (0.08mg /ml) infusion    . [START ON 02/18/2018] tranexamic acid (CYKLOKAPRON) infusion ([facility])    . [START ON 02/18/2018] vancomycin      Scheduled Medications: . amLODipine  10 mg Oral Daily  . aspirin EC  81 mg Oral Daily  . atorvastatin  40 mg Oral Daily  . B-complex with vitamin C  1 tablet Oral Daily  . buPROPion  100 mg Oral TID AC  . busPIRone  15 mg Oral BID  . calcitRIOL  0.5 mcg Oral Daily  . docusate sodium  100 mg Oral BID  . DULoxetine  30 mg Oral Daily  . feeding supplement (GLUCERNA SHAKE)  237 mL Oral TID BM  . gabapentin  300 mg Oral QHS  . [START ON 02/18/2018] heparin-papaverine-plasmalyte irrigation   Irrigation To OR  . hydrALAZINE  100 mg Oral Q8H  . insulin aspart  0-9 Units Subcutaneous TID WC  . isosorbide mononitrate  120 mg Oral Daily  . [START ON 02/18/2018] [facility] Blood Cardioplegia (KBC) lidocaine 2% Syringe (73mL)  13 mL Intracoronary Once  . [START ON 02/18/2018] [facility] Blood Cardioplegia (KBC) lidocaine 2% Syringe (76mL)  13 mL Intracoronary Once  . [START ON 02/18/2018] [facility] Blood Cardioplegia ([facility]BC) mannitol 20% Syringe (3mL)  32 mL Intracoronary Once  . [START ON 02/18/2018] [facility] Blood Cardioplegia ([facility]BC) mannitol 20% Syringe (22mL)  32 mL Intracoronary Once  . levothyroxine  112 mcg Oral QAC breakfast  . [START ON 02/18/2018] magnesium sulfate  40 mEq Other To OR  . montelukast  10 mg Oral Daily  . pantoprazole  40 mg Oral Daily  . polyethylene glycol  17 g Oral Daily  . [START ON 02/18/2018] potassium chloride  80 mEq Other To OR  . ranolazine  500 mg Oral BID  . sodium chloride flush  10-40 mL Intracatheter Q12H  . sodium chloride flush  3 mL Intravenous Q12H  . [START ON 02/18/2018] tranexamic acid  15 mg/kg Intravenous To OR  . [START  ON 02/18/2018] tranexamic acid  2 mg/kg Intracatheter To OR  . [START ON 02/18/2018] vancomycin 1000 mg in NS (1000 ml) irrigation for Dr. Roxy Manns case   Irrigation To OR    have reviewed scheduled and prn medications.  Physical Exam: General: thin, weak- resting- nervous Heart: brady Lungs: poor effort, dec BS at bases Abdomen: soft, non tender Extremities:  Less edema    02/17/2018,7:45 AM  LOS: 8 days

## 2018-02-18 ENCOUNTER — Inpatient Hospital Stay (HOSPITAL_COMMUNITY): Payer: Medicare Other

## 2018-02-18 ENCOUNTER — Inpatient Hospital Stay (HOSPITAL_COMMUNITY): Payer: Medicare Other | Admitting: Certified Registered Nurse Anesthetist

## 2018-02-18 ENCOUNTER — Encounter (HOSPITAL_COMMUNITY)
Admission: AD | Disposition: A | Discharge status: Skilled Nursing Facility | Payer: Self-pay | Source: Ambulatory Visit | Attending: Thoracic Surgery (Cardiothoracic Vascular Surgery)

## 2018-02-18 ENCOUNTER — Encounter (HOSPITAL_COMMUNITY): Payer: Self-pay | Admitting: Thoracic Surgery (Cardiothoracic Vascular Surgery)

## 2018-02-18 ENCOUNTER — Inpatient Hospital Stay: Admit: 2018-02-18 | Payer: Medicare Other | Admitting: Thoracic Surgery (Cardiothoracic Vascular Surgery)

## 2018-02-18 DIAGNOSIS — I481 Persistent atrial fibrillation: Secondary | ICD-10-CM

## 2018-02-18 DIAGNOSIS — Z951 Presence of aortocoronary bypass graft: Secondary | ICD-10-CM

## 2018-02-18 DIAGNOSIS — Z953 Presence of xenogenic heart valve: Secondary | ICD-10-CM

## 2018-02-18 DIAGNOSIS — Z952 Presence of prosthetic heart valve: Secondary | ICD-10-CM

## 2018-02-18 DIAGNOSIS — Z8679 Personal history of other diseases of the circulatory system: Secondary | ICD-10-CM

## 2018-02-18 DIAGNOSIS — Z9889 Other specified postprocedural states: Secondary | ICD-10-CM

## 2018-02-18 DIAGNOSIS — Q211 Atrial septal defect: Secondary | ICD-10-CM

## 2018-02-18 HISTORY — DX: Presence of xenogenic heart valve: Z95.3

## 2018-02-18 HISTORY — DX: Personal history of other diseases of the circulatory system: Z98.890

## 2018-02-18 HISTORY — PX: PATENT FORAMEN OVALE(PFO) CLOSURE: CATH118300

## 2018-02-18 HISTORY — PX: TEE WITHOUT CARDIOVERSION: SHX5443

## 2018-02-18 HISTORY — PX: MAZE: SHX5063

## 2018-02-18 HISTORY — PX: CORONARY ARTERY BYPASS GRAFT: SHX141

## 2018-02-18 HISTORY — PX: MITRAL VALVE REPLACEMENT: SHX147

## 2018-02-18 HISTORY — DX: Personal history of other diseases of the circulatory system: Z86.79

## 2018-02-18 HISTORY — PX: INSERTION OF DIALYSIS CATHETER: SHX1324

## 2018-02-18 LAB — CBC
HCT: 34.3 % — ABNORMAL LOW (ref 36.0–46.0)
HCT: 15.8 % — ABNORMAL LOW (ref 36.0–46.0)
HCT: 32.4 % — ABNORMAL LOW (ref 36.0–46.0)
Hemoglobin: 11.7 g/dL — ABNORMAL LOW (ref 12.0–15.0)
Hemoglobin: 10.7 g/dL — ABNORMAL LOW (ref 12.0–15.0)
Hemoglobin: 5.1 g/dL — CL (ref 12.0–15.0)
MCH: 27.1 pg (ref 26.0–34.0)
MCH: 27.2 pg (ref 26.0–34.0)
MCH: 27.7 pg (ref 26.0–34.0)
MCHC: 34.1 g/dL (ref 30.0–36.0)
MCHC: 32.3 g/dL (ref 30.0–36.0)
MCHC: 33 g/dL (ref 30.0–36.0)
MCV: 79.6 fL (ref 78.0–100.0)
MCV: 82.2 fL (ref 78.0–100.0)
MCV: 85.9 fL (ref 78.0–100.0)
Platelets: 241 10*3/uL (ref 150–400)
Platelets: 131 10*3/uL — ABNORMAL LOW (ref 150–400)
Platelets: 59 10*3/uL — ABNORMAL LOW (ref 150–400)
RBC: 4.31 MIL/uL (ref 3.87–5.11)
RBC: 1.84 MIL/uL — ABNORMAL LOW (ref 3.87–5.11)
RBC: 3.94 MIL/uL (ref 3.87–5.11)
RDW: 13.7 % (ref 11.5–15.5)
RDW: 14.7 % (ref 11.5–15.5)
RDW: 15.3 % (ref 11.5–15.5)
WBC: 6.3 10*3/uL (ref 4.0–10.5)
WBC: 12 10*3/uL — ABNORMAL HIGH (ref 4.0–10.5)
WBC: 6.2 10*3/uL (ref 4.0–10.5)

## 2018-02-18 LAB — GLUCOSE, CAPILLARY
Glucose-Capillary: 91 mg/dL (ref 70–99)
Glucose-Capillary: 137 mg/dL — ABNORMAL HIGH (ref 70–99)
Glucose-Capillary: 115 mg/dL — ABNORMAL HIGH (ref 70–99)
Glucose-Capillary: 130 mg/dL — ABNORMAL HIGH (ref 70–99)
Glucose-Capillary: 125 mg/dL — ABNORMAL HIGH (ref 70–99)
Glucose-Capillary: 122 mg/dL — ABNORMAL HIGH (ref 70–99)
Glucose-Capillary: 109 mg/dL — ABNORMAL HIGH (ref 70–99)
Glucose-Capillary: 152 mg/dL — ABNORMAL HIGH (ref 70–99)

## 2018-02-18 LAB — POCT I-STAT 3, ART BLOOD GAS (G3+)
Acid-base deficit: 3 mmol/L — ABNORMAL HIGH (ref 0.0–2.0)
Acid-base deficit: 3 mmol/L — ABNORMAL HIGH (ref 0.0–2.0)
Acid-base deficit: 1 mmol/L (ref 0.0–2.0)
Acid-base deficit: 3 mmol/L — ABNORMAL HIGH (ref 0.0–2.0)
Acid-base deficit: 1 mmol/L (ref 0.0–2.0)
Acid-base deficit: 2 mmol/L (ref 0.0–2.0)
Bicarbonate: 21.6 mmol/L (ref 20.0–28.0)
Bicarbonate: 22.1 mmol/L (ref 20.0–28.0)
Bicarbonate: 25.9 mmol/L (ref 20.0–28.0)
Bicarbonate: 23.9 mmol/L (ref 20.0–28.0)
Bicarbonate: 26.7 mmol/L (ref 20.0–28.0)
Bicarbonate: 25.3 mmol/L (ref 20.0–28.0)
O2 Saturation: 100 %
O2 Saturation: 98 %
O2 Saturation: 93 %
O2 Saturation: 95 %
O2 Saturation: 95 %
O2 Saturation: 95 %
Patient temperature: 36
Patient temperature: 36
Patient temperature: 36.4
Patient temperature: 36.6
TCO2: 23 mmol/L (ref 22–32)
TCO2: 23 mmol/L (ref 22–32)
TCO2: 28 mmol/L (ref 22–32)
TCO2: 26 mmol/L (ref 22–32)
TCO2: 28 mmol/L (ref 22–32)
TCO2: 27 mmol/L (ref 22–32)
pCO2 arterial: 37 mmHg (ref 32.0–48.0)
pCO2 arterial: 40.7 mmHg (ref 32.0–48.0)
pCO2 arterial: 51.3 mmHg — ABNORMAL HIGH (ref 32.0–48.0)
pCO2 arterial: 50.1 mmHg — ABNORMAL HIGH (ref 32.0–48.0)
pCO2 arterial: 53.9 mmHg — ABNORMAL HIGH (ref 32.0–48.0)
pCO2 arterial: 53.1 mmHg — ABNORMAL HIGH (ref 32.0–48.0)
pH, Arterial: 7.374 (ref 7.350–7.450)
pH, Arterial: 7.342 — ABNORMAL LOW (ref 7.350–7.450)
pH, Arterial: 7.307 — ABNORMAL LOW (ref 7.350–7.450)
pH, Arterial: 7.282 — ABNORMAL LOW (ref 7.350–7.450)
pH, Arterial: 7.3 — ABNORMAL LOW (ref 7.350–7.450)
pH, Arterial: 7.284 — ABNORMAL LOW (ref 7.350–7.450)
pO2, Arterial: 380 mmHg — ABNORMAL HIGH (ref 83.0–108.0)
pO2, Arterial: 120 mmHg — ABNORMAL HIGH (ref 83.0–108.0)
pO2, Arterial: 70 mmHg — ABNORMAL LOW (ref 83.0–108.0)
pO2, Arterial: 83 mmHg (ref 83.0–108.0)
pO2, Arterial: 80 mmHg — ABNORMAL LOW (ref 83.0–108.0)
pO2, Arterial: 83 mmHg (ref 83.0–108.0)

## 2018-02-18 LAB — POCT I-STAT, CHEM 8
BUN: 59 mg/dL — ABNORMAL HIGH (ref 8–23)
BUN: 50 mg/dL — ABNORMAL HIGH (ref 8–23)
BUN: 56 mg/dL — ABNORMAL HIGH (ref 8–23)
BUN: 52 mg/dL — ABNORMAL HIGH (ref 8–23)
BUN: 53 mg/dL — ABNORMAL HIGH (ref 8–23)
BUN: 52 mg/dL — ABNORMAL HIGH (ref 8–23)
Calcium, Ion: 1.31 mmol/L (ref 1.15–1.40)
Calcium, Ion: 1.08 mmol/L — ABNORMAL LOW (ref 1.15–1.40)
Calcium, Ion: 1.25 mmol/L (ref 1.15–1.40)
Calcium, Ion: 1.1 mmol/L — ABNORMAL LOW (ref 1.15–1.40)
Calcium, Ion: 1.31 mmol/L (ref 1.15–1.40)
Calcium, Ion: 1.31 mmol/L (ref 1.15–1.40)
Chloride: 97 mmol/L — ABNORMAL LOW (ref 98–111)
Chloride: 101 mmol/L (ref 98–111)
Chloride: 98 mmol/L (ref 98–111)
Chloride: 96 mmol/L — ABNORMAL LOW (ref 98–111)
Chloride: 100 mmol/L (ref 98–111)
Chloride: 102 mmol/L (ref 98–111)
Creatinine, Ser: 5.6 mg/dL — ABNORMAL HIGH (ref 0.44–1.00)
Creatinine, Ser: 4.8 mg/dL — ABNORMAL HIGH (ref 0.44–1.00)
Creatinine, Ser: 5.3 mg/dL — ABNORMAL HIGH (ref 0.44–1.00)
Creatinine, Ser: 5 mg/dL — ABNORMAL HIGH (ref 0.44–1.00)
Creatinine, Ser: 5 mg/dL — ABNORMAL HIGH (ref 0.44–1.00)
Creatinine, Ser: 4.6 mg/dL — ABNORMAL HIGH (ref 0.44–1.00)
Glucose, Bld: 108 mg/dL — ABNORMAL HIGH (ref 70–99)
Glucose, Bld: 108 mg/dL — ABNORMAL HIGH (ref 70–99)
Glucose, Bld: 122 mg/dL — ABNORMAL HIGH (ref 70–99)
Glucose, Bld: 120 mg/dL — ABNORMAL HIGH (ref 70–99)
Glucose, Bld: 158 mg/dL — ABNORMAL HIGH (ref 70–99)
Glucose, Bld: 115 mg/dL — ABNORMAL HIGH (ref 70–99)
HCT: 33 % — ABNORMAL LOW (ref 36.0–46.0)
HCT: 23 % — ABNORMAL LOW (ref 36.0–46.0)
HCT: 29 % — ABNORMAL LOW (ref 36.0–46.0)
HCT: 20 % — ABNORMAL LOW (ref 36.0–46.0)
HCT: 26 % — ABNORMAL LOW (ref 36.0–46.0)
HCT: 37 % (ref 36.0–46.0)
Hemoglobin: 11.2 g/dL — ABNORMAL LOW (ref 12.0–15.0)
Hemoglobin: 7.8 g/dL — ABNORMAL LOW (ref 12.0–15.0)
Hemoglobin: 9.9 g/dL — ABNORMAL LOW (ref 12.0–15.0)
Hemoglobin: 6.8 g/dL — CL (ref 12.0–15.0)
Hemoglobin: 8.8 g/dL — ABNORMAL LOW (ref 12.0–15.0)
Hemoglobin: 12.6 g/dL (ref 12.0–15.0)
Potassium: 3.7 mmol/L (ref 3.5–5.1)
Potassium: 3 mmol/L — ABNORMAL LOW (ref 3.5–5.1)
Potassium: 3.5 mmol/L (ref 3.5–5.1)
Potassium: 3.8 mmol/L (ref 3.5–5.1)
Potassium: 4.2 mmol/L (ref 3.5–5.1)
Potassium: 3.8 mmol/L (ref 3.5–5.1)
Sodium: 136 mmol/L (ref 135–145)
Sodium: 135 mmol/L (ref 135–145)
Sodium: 137 mmol/L (ref 135–145)
Sodium: 133 mmol/L — ABNORMAL LOW (ref 135–145)
Sodium: 135 mmol/L (ref 135–145)
Sodium: 140 mmol/L (ref 135–145)
TCO2: 28 mmol/L (ref 22–32)
TCO2: 24 mmol/L (ref 22–32)
TCO2: 25 mmol/L (ref 22–32)
TCO2: 25 mmol/L (ref 22–32)
TCO2: 23 mmol/L (ref 22–32)
TCO2: 27 mmol/L (ref 22–32)

## 2018-02-18 LAB — CBC WITH DIFFERENTIAL/PLATELET
Abs Immature Granulocytes: 0.1 10*3/uL (ref 0.0–0.1)
Basophils Absolute: 0.1 10*3/uL (ref 0.0–0.1)
Basophils Relative: 0 %
Eosinophils Absolute: 0.1 10*3/uL (ref 0.0–0.7)
Eosinophils Relative: 1 %
HCT: 35.6 % — ABNORMAL LOW (ref 36.0–46.0)
Hemoglobin: 11.9 g/dL — ABNORMAL LOW (ref 12.0–15.0)
Immature Granulocytes: 0 %
Lymphocytes Relative: 2 %
Lymphs Abs: 0.3 10*3/uL — ABNORMAL LOW (ref 0.7–4.0)
MCH: 27.5 pg (ref 26.0–34.0)
MCHC: 33.4 g/dL (ref 30.0–36.0)
MCV: 82.4 fL (ref 78.0–100.0)
Monocytes Absolute: 1.2 10*3/uL — ABNORMAL HIGH (ref 0.1–1.0)
Monocytes Relative: 7 %
Neutro Abs: 16 10*3/uL — ABNORMAL HIGH (ref 1.7–7.7)
Neutrophils Relative %: 90 %
Platelets: 141 10*3/uL — ABNORMAL LOW (ref 150–400)
RBC: 4.32 MIL/uL (ref 3.87–5.11)
RDW: 14.8 % (ref 11.5–15.5)
WBC: 17.7 10*3/uL — ABNORMAL HIGH (ref 4.0–10.5)

## 2018-02-18 LAB — PROTIME-INR
INR: 1.59
Prothrombin Time: 18.8 seconds — ABNORMAL HIGH (ref 11.4–15.2)

## 2018-02-18 LAB — POCT I-STAT 4, (NA,K, GLUC, HGB,HCT)
Glucose, Bld: 142 mg/dL — ABNORMAL HIGH (ref 70–99)
Glucose, Bld: 149 mg/dL — ABNORMAL HIGH (ref 70–99)
HCT: 29 % — ABNORMAL LOW (ref 36.0–46.0)
HCT: 32 % — ABNORMAL LOW (ref 36.0–46.0)
Hemoglobin: 10.9 g/dL — ABNORMAL LOW (ref 12.0–15.0)
Hemoglobin: 9.9 g/dL — ABNORMAL LOW (ref 12.0–15.0)
Potassium: 3.5 mmol/L (ref 3.5–5.1)
Potassium: 3.6 mmol/L (ref 3.5–5.1)
Sodium: 139 mmol/L (ref 135–145)
Sodium: 139 mmol/L (ref 135–145)

## 2018-02-18 LAB — MAGNESIUM: Magnesium: 3.1 mg/dL — ABNORMAL HIGH (ref 1.7–2.4)

## 2018-02-18 LAB — APTT: aPTT: 37 seconds — ABNORMAL HIGH (ref 24–36)

## 2018-02-18 LAB — PREPARE RBC (CROSSMATCH)

## 2018-02-18 LAB — CREATININE, SERUM
Creatinine, Ser: 4.64 mg/dL — ABNORMAL HIGH (ref 0.44–1.00)
GFR calc Af Amer: 10 mL/min — ABNORMAL LOW (ref >60)
GFR calc non Af Amer: 9 mL/min — ABNORMAL LOW (ref >60)

## 2018-02-18 LAB — PLATELET COUNT: Platelets: 84 10*3/uL — ABNORMAL LOW (ref 150–400)

## 2018-02-18 LAB — HEMOGLOBIN AND HEMATOCRIT, BLOOD
HCT: 25.1 % — ABNORMAL LOW (ref 36.0–46.0)
Hemoglobin: 8.4 g/dL — ABNORMAL LOW (ref 12.0–15.0)

## 2018-02-18 SURGERY — REPLACEMENT, MITRAL VALVE
Anesthesia: General | Site: Chest

## 2018-02-18 MED ORDER — SODIUM CHLORIDE 0.9% FLUSH: 10.0000 mL | INTRAVENOUS | Status: DC | PRN

## 2018-02-18 MED ORDER — HEPARIN SODIUM (PORCINE) 1000 UNIT/ML IJ SOLN
INTRAMUSCULAR | Status: AC
  Filled 2018-02-18: qty 1

## 2018-02-18 MED ORDER — ACETAMINOPHEN 160 MG/5ML PO SOLN
1000.0000 mg | Freq: Four times a day (QID) | ORAL | Status: DC
  Administered 2018-02-18 – 2018-02-19 (×3): 1000 mg
  Filled 2018-02-18 (×4): qty 40.6

## 2018-02-18 MED ORDER — HEPARIN SOD (PORK) LOCK FLUSH 100 UNIT/ML IV SOLN
INTRAVENOUS | Status: AC
  Filled 2018-02-18: qty 5

## 2018-02-18 MED ORDER — CHLORHEXIDINE GLUCONATE 0.12% ORAL RINSE (MEDLINE KIT)
15.0000 mL | Freq: Two times a day (BID) | OROMUCOSAL | Status: DC
  Administered 2018-02-18 – 2018-02-19 (×2): 15 mL via OROMUCOSAL

## 2018-02-18 MED ORDER — SODIUM CHLORIDE 0.9% FLUSH
3.0000 mL | Freq: Two times a day (BID) | INTRAVENOUS | Status: DC
  Administered 2018-02-19: 3 mL via INTRAVENOUS

## 2018-02-18 MED ORDER — ARTIFICIAL TEARS OPHTHALMIC OINT
TOPICAL_OINTMENT | OPHTHALMIC | Status: DC | PRN
  Administered 2018-02-18: 1 via OPHTHALMIC

## 2018-02-18 MED ORDER — FAMOTIDINE IN NACL 20-0.9 MG/50ML-% IV SOLN
20.0000 mg | Freq: Two times a day (BID) | INTRAVENOUS | Status: AC
  Administered 2018-02-18 (×2): 20 mg via INTRAVENOUS
  Filled 2018-02-18: qty 50

## 2018-02-18 MED ORDER — PROPOFOL 10 MG/ML IV BOLUS
INTRAVENOUS | Status: AC
  Filled 2018-02-18: qty 20

## 2018-02-18 MED ORDER — LACTATED RINGERS IV SOLN: INTRAVENOUS | Status: DC

## 2018-02-18 MED ORDER — MIDAZOLAM HCL 5 MG/5ML IJ SOLN
INTRAMUSCULAR | Status: DC | PRN
  Administered 2018-02-18: 2 mg via INTRAVENOUS
  Administered 2018-02-18: 4 mg via INTRAVENOUS
  Administered 2018-02-18 (×2): 2 mg via INTRAVENOUS

## 2018-02-18 MED ORDER — SODIUM CHLORIDE 0.9 % IV SOLN: INTRAVENOUS | Status: DC

## 2018-02-18 MED ORDER — FENTANYL CITRATE (PF) 250 MCG/5ML IJ SOLN
INTRAMUSCULAR | Status: DC | PRN
  Administered 2018-02-18: 200 ug via INTRAVENOUS
  Administered 2018-02-18: 150 ug via INTRAVENOUS
  Administered 2018-02-18: 100 ug via INTRAVENOUS
  Administered 2018-02-18: 50 ug via INTRAVENOUS
  Administered 2018-02-18: 150 ug via INTRAVENOUS
  Administered 2018-02-18: 100 ug via INTRAVENOUS
  Administered 2018-02-18: 150 ug via INTRAVENOUS
  Administered 2018-02-18 (×2): 100 ug via INTRAVENOUS
  Administered 2018-02-18: 150 ug via INTRAVENOUS

## 2018-02-18 MED ORDER — ALBUMIN HUMAN 5 % IV SOLN: 250.0000 mL | INTRAVENOUS | Status: DC | PRN

## 2018-02-18 MED ORDER — ACETAMINOPHEN 160 MG/5ML PO SOLN: 650.0000 mg | Freq: Once | ORAL | Status: AC

## 2018-02-18 MED ORDER — BISACODYL 10 MG RE SUPP: 10.0000 mg | Freq: Every day | RECTAL | Status: DC

## 2018-02-18 MED ORDER — SODIUM CHLORIDE 0.9% FLUSH
3.0000 mL | INTRAVENOUS | Status: DC | PRN
  Administered 2018-03-03 – 2018-03-04 (×2): 3 mL via INTRAVENOUS
  Filled 2018-02-18 (×2): qty 3

## 2018-02-18 MED ORDER — SODIUM CHLORIDE 0.9 % IV SOLN
INTRAVENOUS | Status: DC | PRN
  Administered 2018-02-18: 07:00:00 via INTRAVENOUS

## 2018-02-18 MED ORDER — ASPIRIN 81 MG PO CHEW
324.0000 mg | CHEWABLE_TABLET | Freq: Every day | ORAL | Status: DC
  Administered 2018-02-19: 324 mg
  Filled 2018-02-18: qty 4

## 2018-02-18 MED ORDER — SODIUM CHLORIDE 0.9 % IV SOLN
1.5000 g | INTRAVENOUS | Status: AC
  Administered 2018-02-19 – 2018-02-20 (×2): 1.5 g via INTRAVENOUS
  Filled 2018-02-18 (×2): qty 1.5

## 2018-02-18 MED ORDER — PROTAMINE SULFATE 10 MG/ML IV SOLN
INTRAVENOUS | Status: DC | PRN
  Administered 2018-02-18: 200 mg via INTRAVENOUS

## 2018-02-18 MED ORDER — FENTANYL CITRATE (PF) 250 MCG/5ML IJ SOLN
INTRAMUSCULAR | Status: AC
  Filled 2018-02-18: qty 5

## 2018-02-18 MED ORDER — TRAMADOL HCL 50 MG PO TABS
50.0000 mg | ORAL_TABLET | ORAL | Status: DC | PRN
  Administered 2018-02-19 – 2018-02-20 (×4): 50 mg via ORAL
  Administered 2018-02-21 – 2018-02-23 (×2): 100 mg via ORAL
  Administered 2018-02-24: 50 mg via ORAL
  Administered 2018-02-25 – 2018-03-04 (×6): 100 mg via ORAL
  Filled 2018-02-18: qty 2
  Filled 2018-02-18: qty 1
  Filled 2018-02-18: qty 2
  Filled 2018-02-18 (×3): qty 1
  Filled 2018-02-18 (×7): qty 2
  Filled 2018-02-18: qty 1

## 2018-02-18 MED ORDER — ARTIFICIAL TEARS OPHTHALMIC OINT
TOPICAL_OINTMENT | OPHTHALMIC | Status: AC
  Filled 2018-02-18: qty 3.5

## 2018-02-18 MED ORDER — DEXMEDETOMIDINE HCL IN NACL 200 MCG/50ML IV SOLN
INTRAVENOUS | Status: AC
  Filled 2018-02-18: qty 50

## 2018-02-18 MED ORDER — SODIUM CHLORIDE 0.9 % IV SOLN
120.0000 mg | INTRAVENOUS | Status: DC
  Administered 2018-02-18: 120 mg via INTRAVENOUS
  Filled 2018-02-18 (×3): qty 12

## 2018-02-18 MED ORDER — ORAL CARE MOUTH RINSE
15.0000 mL | OROMUCOSAL | Status: DC
  Administered 2018-02-18 – 2018-02-19 (×6): 15 mL via OROMUCOSAL

## 2018-02-18 MED ORDER — PROTAMINE SULFATE 10 MG/ML IV SOLN
INTRAVENOUS | Status: AC
  Filled 2018-02-18: qty 25

## 2018-02-18 MED ORDER — CHLORHEXIDINE GLUCONATE 0.12 % MT SOLN
15.0000 mL | OROMUCOSAL | Status: AC
  Administered 2018-02-18: 15 mL via OROMUCOSAL

## 2018-02-18 MED ORDER — OXYCODONE HCL 5 MG PO TABS
5.0000 mg | ORAL_TABLET | ORAL | Status: DC | PRN
  Administered 2018-02-20 (×2): 5 mg via ORAL
  Administered 2018-02-21: 10 mg via ORAL
  Administered 2018-02-21 (×3): 5 mg via ORAL
  Administered 2018-02-22: 10 mg via ORAL
  Administered 2018-02-22 (×3): 5 mg via ORAL
  Administered 2018-02-23 – 2018-03-01 (×29): 10 mg via ORAL
  Administered 2018-03-01: 5 mg via ORAL
  Administered 2018-03-01 – 2018-03-05 (×22): 10 mg via ORAL
  Filled 2018-02-18 (×22): qty 2
  Filled 2018-02-18: qty 1
  Filled 2018-02-18 (×16): qty 2
  Filled 2018-02-18: qty 1
  Filled 2018-02-18 (×4): qty 2
  Filled 2018-02-18: qty 1
  Filled 2018-02-18: qty 2
  Filled 2018-02-18 (×2): qty 1
  Filled 2018-02-18 (×2): qty 2
  Filled 2018-02-18: qty 1
  Filled 2018-02-18 (×6): qty 2
  Filled 2018-02-18: qty 1
  Filled 2018-02-18 (×2): qty 2
  Filled 2018-02-18: qty 1
  Filled 2018-02-18: qty 2
  Filled 2018-02-18: qty 1

## 2018-02-18 MED ORDER — HEPARIN SODIUM (PORCINE) 1000 UNIT/ML IJ SOLN
INTRAMUSCULAR | Status: DC | PRN
  Administered 2018-02-18: 5000 [IU] via INTRAVENOUS
  Administered 2018-02-18: 20000 [IU] via INTRAVENOUS

## 2018-02-18 MED ORDER — SODIUM CHLORIDE 0.9 % IV SOLN: 250.0000 mL | INTRAVENOUS | Status: DC

## 2018-02-18 MED ORDER — HEMOSTATIC AGENTS (NO CHARGE) OPTIME
TOPICAL | Status: DC | PRN
  Administered 2018-02-18: 1 via TOPICAL

## 2018-02-18 MED ORDER — VANCOMYCIN HCL IN DEXTROSE 1-5 GM/200ML-% IV SOLN
1000.0000 mg | Freq: Once | INTRAVENOUS | Status: AC
  Administered 2018-02-18: 1000 mg via INTRAVENOUS
  Filled 2018-02-18: qty 200

## 2018-02-18 MED ORDER — PHENYLEPHRINE HCL-NACL 20-0.9 MG/250ML-% IV SOLN
0.0000 ug/min | INTRAVENOUS | Status: DC
  Filled 2018-02-18: qty 250

## 2018-02-18 MED ORDER — MIDAZOLAM HCL 10 MG/2ML IJ SOLN
INTRAMUSCULAR | Status: AC
  Filled 2018-02-18: qty 2

## 2018-02-18 MED ORDER — ACETAMINOPHEN 500 MG PO TABS
1000.0000 mg | ORAL_TABLET | Freq: Four times a day (QID) | ORAL | Status: AC
  Administered 2018-02-19 – 2018-02-23 (×17): 1000 mg via ORAL
  Filled 2018-02-18 (×16): qty 2

## 2018-02-18 MED ORDER — ROCURONIUM BROMIDE 10 MG/ML (PF) SYRINGE
PREFILLED_SYRINGE | INTRAVENOUS | Status: DC | PRN
  Administered 2018-02-18: 50 mg via INTRAVENOUS
  Administered 2018-02-18: 20 mg via INTRAVENOUS

## 2018-02-18 MED ORDER — METOPROLOL TARTRATE 5 MG/5ML IV SOLN
2.5000 mg | INTRAVENOUS | Status: DC | PRN
  Administered 2018-02-19: 2.5 mg via INTRAVENOUS
  Administered 2018-02-20 – 2018-02-23 (×2): 5 mg via INTRAVENOUS
  Filled 2018-02-18 (×3): qty 5

## 2018-02-18 MED ORDER — PANTOPRAZOLE SODIUM 40 MG PO TBEC
40.0000 mg | DELAYED_RELEASE_TABLET | Freq: Every day | ORAL | Status: DC
  Administered 2018-02-20 – 2018-03-05 (×14): 40 mg via ORAL
  Filled 2018-02-18 (×14): qty 1

## 2018-02-18 MED ORDER — NITROGLYCERIN IN D5W 200-5 MCG/ML-% IV SOLN: 0.0000 ug/min | INTRAVENOUS | Status: DC

## 2018-02-18 MED ORDER — ONDANSETRON HCL 4 MG/2ML IJ SOLN
4.0000 mg | Freq: Four times a day (QID) | INTRAMUSCULAR | Status: DC | PRN
  Administered 2018-02-19 – 2018-02-20 (×3): 4 mg via INTRAVENOUS
  Filled 2018-02-18 (×3): qty 2

## 2018-02-18 MED ORDER — DOPAMINE-DEXTROSE 3.2-5 MG/ML-% IV SOLN: 3.0000 ug/kg/min | INTRAVENOUS | Status: DC

## 2018-02-18 MED ORDER — PHENYLEPHRINE 40 MCG/ML (10ML) SYRINGE FOR IV PUSH (FOR BLOOD PRESSURE SUPPORT)
PREFILLED_SYRINGE | INTRAVENOUS | Status: AC
  Filled 2018-02-18: qty 10

## 2018-02-18 MED ORDER — FENTANYL CITRATE (PF) 250 MCG/5ML IJ SOLN
INTRAMUSCULAR | Status: AC
  Filled 2018-02-18: qty 20

## 2018-02-18 MED ORDER — MORPHINE SULFATE (PF) 2 MG/ML IV SOLN
1.0000 mg | INTRAVENOUS | Status: DC | PRN
  Administered 2018-02-18 (×2): 2 mg via INTRAVENOUS
  Filled 2018-02-18 (×2): qty 1

## 2018-02-18 MED ORDER — SODIUM CHLORIDE 0.9 % IV SOLN
INTRAVENOUS | Status: DC
  Filled 2018-02-18: qty 1

## 2018-02-18 MED ORDER — DEXMEDETOMIDINE HCL IN NACL 200 MCG/50ML IV SOLN
0.0000 ug/kg/h | INTRAVENOUS | Status: DC
  Filled 2018-02-18: qty 50

## 2018-02-18 MED ORDER — DOCUSATE SODIUM 100 MG PO CAPS
200.0000 mg | ORAL_CAPSULE | Freq: Every day | ORAL | Status: DC
  Administered 2018-02-20: 200 mg via ORAL
  Filled 2018-02-18: qty 2

## 2018-02-18 MED ORDER — SODIUM CHLORIDE 0.9% IV SOLUTION: Freq: Once | INTRAVENOUS | Status: DC

## 2018-02-18 MED ORDER — ASPIRIN EC 325 MG PO TBEC
325.0000 mg | DELAYED_RELEASE_TABLET | Freq: Every day | ORAL | Status: DC
  Administered 2018-02-20 – 2018-02-27 (×8): 325 mg via ORAL
  Filled 2018-02-18 (×8): qty 1

## 2018-02-18 MED ORDER — ROCURONIUM BROMIDE 50 MG/5ML IV SOSY
PREFILLED_SYRINGE | INTRAVENOUS | Status: AC
  Filled 2018-02-18: qty 10

## 2018-02-18 MED ORDER — LACTATED RINGERS IV SOLN: 500.0000 mL | Freq: Once | INTRAVENOUS | Status: DC | PRN

## 2018-02-18 MED ORDER — SODIUM CHLORIDE 0.9 % IR SOLN
Status: DC | PRN
  Administered 2018-02-18: 1000 mL

## 2018-02-18 MED ORDER — MIDAZOLAM HCL 2 MG/2ML IJ SOLN: 2.0000 mg | INTRAMUSCULAR | Status: DC | PRN

## 2018-02-18 MED ORDER — CHLORHEXIDINE GLUCONATE CLOTH 2 % EX PADS
6.0000 | MEDICATED_PAD | Freq: Every day | CUTANEOUS | Status: DC
  Administered 2018-02-18 – 2018-02-27 (×7): 6 via TOPICAL

## 2018-02-18 MED ORDER — BISACODYL 5 MG PO TBEC
10.0000 mg | DELAYED_RELEASE_TABLET | Freq: Every day | ORAL | Status: DC
  Administered 2018-02-20: 10 mg via ORAL
  Filled 2018-02-18: qty 2

## 2018-02-18 MED ORDER — SODIUM CHLORIDE 0.45 % IV SOLN
INTRAVENOUS | Status: DC | PRN
  Administered 2018-02-18: 15:00:00 via INTRAVENOUS

## 2018-02-18 MED ORDER — INSULIN REGULAR BOLUS VIA INFUSION
0.0000 [IU] | Freq: Three times a day (TID) | INTRAVENOUS | Status: DC
  Filled 2018-02-18: qty 10

## 2018-02-18 MED ORDER — MORPHINE SULFATE (PF) 2 MG/ML IV SOLN
1.0000 mg | INTRAVENOUS | Status: DC | PRN
  Administered 2018-02-19: 2 mg via INTRAVENOUS
  Administered 2018-02-20 – 2018-02-22 (×8): 1 mg via INTRAVENOUS
  Administered 2018-02-23 – 2018-02-26 (×4): 2 mg via INTRAVENOUS
  Administered 2018-02-27: 1 mg via INTRAVENOUS
  Administered 2018-02-27 – 2018-03-01 (×3): 2 mg via INTRAVENOUS
  Filled 2018-02-18 (×19): qty 1

## 2018-02-18 MED ORDER — LACTATED RINGERS IV SOLN
INTRAVENOUS | Status: DC
  Administered 2018-02-19 – 2018-02-22 (×3): via INTRAVENOUS

## 2018-02-18 MED ORDER — SODIUM CHLORIDE 0.9 % IV SOLN
INTRAVENOUS | Status: DC
  Administered 2018-02-18: 15:00:00 via INTRAVENOUS

## 2018-02-18 MED ORDER — PROPOFOL 10 MG/ML IV BOLUS
INTRAVENOUS | Status: DC | PRN
  Administered 2018-02-18: 90 mg via INTRAVENOUS

## 2018-02-18 MED ORDER — ONDANSETRON HCL 4 MG/2ML IJ SOLN
INTRAMUSCULAR | Status: AC
  Filled 2018-02-18: qty 2

## 2018-02-18 MED ORDER — ACETAMINOPHEN 650 MG RE SUPP
650.0000 mg | Freq: Once | RECTAL | Status: AC
  Administered 2018-02-18: 650 mg via RECTAL

## 2018-02-18 SURGICAL SUPPLY — 154 items
ADAPTER CARDIO PERF ANTE/RETRO (ADAPTER) ×3 IMPLANT
APPLICATOR COTTON TIP 6IN STRL (MISCELLANEOUS) IMPLANT
ARTICLIP LAA PROCLIP II 45 (Clip) ×3 IMPLANT
ATTRACTOMAT 16X20 MAGNETIC DRP (DRAPES) ×3 IMPLANT
BAG DECANTER FOR FLEXI CONT (MISCELLANEOUS) ×9 IMPLANT
BANDAGE ACE 3X5.8 VEL STRL LF (GAUZE/BANDAGES/DRESSINGS) ×3 IMPLANT
BANDAGE ACE 4X5 VEL STRL LF (GAUZE/BANDAGES/DRESSINGS) ×3 IMPLANT
BANDAGE ACE 6X5 VEL STRL LF (GAUZE/BANDAGES/DRESSINGS) ×3 IMPLANT
BASKET HEART (ORDER IN 25'S) (MISCELLANEOUS) ×1
BASKET HEART (ORDER IN 25S) (MISCELLANEOUS) ×2 IMPLANT
BLADE CLIPPER SURG (BLADE) IMPLANT
BLADE STERNUM SYSTEM 6 (BLADE) ×6 IMPLANT
BLADE SURG 11 STRL SS (BLADE) ×3 IMPLANT
BNDG GAUZE ELAST 4 BULKY (GAUZE/BANDAGES/DRESSINGS) ×3 IMPLANT
CANISTER SUCT 3000ML PPV (MISCELLANEOUS) ×6 IMPLANT
CANN PRFSN 3/8X14X24FR PCFC (MISCELLANEOUS)
CANN PRFSN 3/8XCNCT ST RT ANG (MISCELLANEOUS)
CANNULA EZ GLIDE AORTIC 21FR (CANNULA) ×3 IMPLANT
CANNULA FEM VENOUS REMOTE 22FR (CANNULA) ×6 IMPLANT
CANNULA FEMORAL ART 14 SM (MISCELLANEOUS) ×3 IMPLANT
CANNULA GUNDRY RCSP 15FR (MISCELLANEOUS) ×3 IMPLANT
CANNULA PRFSN 3/8X14X24FR PCFC (MISCELLANEOUS) IMPLANT
CANNULA PRFSN 3/8XCNCT RT ANG (MISCELLANEOUS) IMPLANT
CANNULA VEN MTL TIP RT (MISCELLANEOUS)
CATH CPB KIT OWEN (MISCELLANEOUS) ×3 IMPLANT
CATH THORACIC 28FR RT ANG (CATHETERS) IMPLANT
CATH THORACIC 36FR (CATHETERS) ×3 IMPLANT
CATH TRIALYSIS 20CM 13F 3LUM (CATHETERS) ×3 IMPLANT
CLAMP ISOLATOR SYNERGY LG (MISCELLANEOUS) ×3 IMPLANT
CLAMP OLL ABLATION (MISCELLANEOUS) ×3 IMPLANT
CLIP FOGARTY SPRING 6M (CLIP) IMPLANT
CLIP VESOCCLUDE MED 24/CT (CLIP) IMPLANT
CLIP VESOCCLUDE SM WIDE 24/CT (CLIP) IMPLANT
CONN 1/2X1/2X1/2  BEN (MISCELLANEOUS) ×1
CONN 1/2X1/2X1/2 BEN (MISCELLANEOUS) ×2 IMPLANT
CONN 3/8X1/2 ST GISH (MISCELLANEOUS) ×6 IMPLANT
CONN ST 1/4X3/8  BEN (MISCELLANEOUS) ×3
CONN ST 1/4X3/8 BEN (MISCELLANEOUS) ×6 IMPLANT
CONN Y 3/8X3/8X3/8  BEN (MISCELLANEOUS) ×2
CONN Y 3/8X3/8X3/8 BEN (MISCELLANEOUS) ×4 IMPLANT
CONNECTOR 1/2X3/8X1/2 3 WAY (MISCELLANEOUS) ×1
CONNECTOR 1/2X3/8X1/2 3WAY (MISCELLANEOUS) ×2 IMPLANT
COR-KNOT ELITE COMBO KIT (Prosthesis & Implant Heart) ×3 IMPLANT
COVER PROBE W GEL 5X96 (DRAPES) ×3 IMPLANT
COVER SURGICAL LIGHT HANDLE (MISCELLANEOUS) ×3 IMPLANT
CRADLE DONUT ADULT HEAD (MISCELLANEOUS) ×6 IMPLANT
DERMABOND ADVANCED (GAUZE/BANDAGES/DRESSINGS) ×1
DERMABOND ADVANCED .7 DNX12 (GAUZE/BANDAGES/DRESSINGS) ×2 IMPLANT
DEVICE ATRICLIP LAA PRCLPII 45 (Clip) ×2 IMPLANT
DEVICE SUT CK QUICK LOAD MINI (Prosthesis & Implant Heart) ×6 IMPLANT
DRAIN CHANNEL 32F RND 10.7 FF (WOUND CARE) ×9 IMPLANT
DRAPE CARDIOVASCULAR INCISE (DRAPES) ×1
DRAPE INCISE IOBAN 66X45 STRL (DRAPES) ×6 IMPLANT
DRAPE SLUSH/WARMER DISC (DRAPES) ×6 IMPLANT
DRAPE SRG 135X102X78XABS (DRAPES) ×2 IMPLANT
DRSG AQUACEL AG ADV 3.5X14 (GAUZE/BANDAGES/DRESSINGS) ×3 IMPLANT
DRSG COVADERM 4X14 (GAUZE/BANDAGES/DRESSINGS) ×6 IMPLANT
ELECT BLADE 4.0 EZ CLEAN MEGAD (MISCELLANEOUS) ×3
ELECT REM PT RETURN 9FT ADLT (ELECTROSURGICAL) ×12
ELECTRODE BLDE 4.0 EZ CLN MEGD (MISCELLANEOUS) ×2 IMPLANT
ELECTRODE REM PT RTRN 9FT ADLT (ELECTROSURGICAL) ×8 IMPLANT
FELT TEFLON 1X6 (MISCELLANEOUS) ×12 IMPLANT
GAUZE SPONGE 4X4 12PLY STRL (GAUZE/BANDAGES/DRESSINGS) ×6 IMPLANT
GLOVE BIO SURGEON STRL SZ 6 (GLOVE) IMPLANT
GLOVE BIO SURGEON STRL SZ 6.5 (GLOVE) IMPLANT
GLOVE BIO SURGEON STRL SZ7 (GLOVE) IMPLANT
GLOVE BIO SURGEON STRL SZ7.5 (GLOVE) IMPLANT
GLOVE ORTHO TXT STRL SZ7.5 (GLOVE) ×6 IMPLANT
GOWN STRL REUS W/ TWL LRG LVL3 (GOWN DISPOSABLE) ×16 IMPLANT
GOWN STRL REUS W/TWL LRG LVL3 (GOWN DISPOSABLE) ×8
HEMOSTAT POWDER SURGIFOAM 1G (HEMOSTASIS) ×18 IMPLANT
INSERT FOGARTY XLG (MISCELLANEOUS) ×6 IMPLANT
KIT BASIN OR (CUSTOM PROCEDURE TRAY) ×6 IMPLANT
KIT DEVICE SUT COR-KNOT MIS 5 (INSTRUMENTS) ×3 IMPLANT
KIT DILATOR VASC 18G NDL (KITS) ×3 IMPLANT
KIT DRAINAGE VACCUM ASSIST (KITS) ×3 IMPLANT
KIT SUCTION CATH 14FR (SUCTIONS) ×18 IMPLANT
KIT TURNOVER KIT B (KITS) ×6 IMPLANT
KIT VASOVIEW HEMOPRO VH 3000 (KITS) ×3 IMPLANT
LEAD PACING MYOCARDI (MISCELLANEOUS) ×3 IMPLANT
LINE VENT (MISCELLANEOUS) ×3 IMPLANT
LOOP VESSEL SUPERMAXI WHITE (MISCELLANEOUS) ×6 IMPLANT
MARKER GRAFT CORONARY BYPASS (MISCELLANEOUS) ×9 IMPLANT
NS IRRIG 1000ML POUR BTL (IV SOLUTION) ×30 IMPLANT
PACK E OPEN HEART (SUTURE) ×3 IMPLANT
PACK OPEN HEART (CUSTOM PROCEDURE TRAY) ×6 IMPLANT
PAD ARMBOARD 7.5X6 YLW CONV (MISCELLANEOUS) ×12 IMPLANT
PAD ELECT DEFIB RADIOL ZOLL (MISCELLANEOUS) ×3 IMPLANT
PENCIL BUTTON HOLSTER BLD 10FT (ELECTRODE) ×3 IMPLANT
PROBE CRYO2-ABLATION MALLABLE (MISCELLANEOUS) ×3 IMPLANT
PUNCH AORTIC ROTATE  4.5MM 8IN (MISCELLANEOUS) ×3 IMPLANT
PUNCH AORTIC ROTATE 4.0MM (MISCELLANEOUS) IMPLANT
PUNCH AORTIC ROTATE 4.5MM 8IN (MISCELLANEOUS) IMPLANT
PUNCH AORTIC ROTATE 5MM 8IN (MISCELLANEOUS) IMPLANT
SET CARDIOPLEGIA MPS 5001102 (MISCELLANEOUS) ×3 IMPLANT
SET IRRIG TUBING LAPAROSCOPIC (IRRIGATION / IRRIGATOR) ×3 IMPLANT
SOLUTION ANTI FOG 6CC (MISCELLANEOUS) IMPLANT
SPONGE LAP 18X18 X RAY DECT (DISPOSABLE) ×3 IMPLANT
SPONGE LAP 4X18 RFD (DISPOSABLE) ×3 IMPLANT
SUCKER INTRACARDIAC WEIGHTED (SUCKER) ×3 IMPLANT
SUT BONE WAX W31G (SUTURE) ×6 IMPLANT
SUT ETHIBON 2 0 V 52N 30 (SUTURE) ×6 IMPLANT
SUT ETHIBOND 2 0 SH (SUTURE) ×8 IMPLANT
SUT ETHIBOND 2 0 SH 36X2 (SUTURE) ×4 IMPLANT
SUT ETHIBOND 2 0 V4 (SUTURE) IMPLANT
SUT ETHIBOND 2 0V4 GREEN (SUTURE) IMPLANT
SUT ETHIBOND 4 0 TF (SUTURE) IMPLANT
SUT ETHIBOND 5 0 C 1 30 (SUTURE) IMPLANT
SUT ETHIBOND X763 2 0 SH 1 (SUTURE) ×12 IMPLANT
SUT MNCRL AB 3-0 PS2 18 (SUTURE) ×6 IMPLANT
SUT MNCRL AB 4-0 PS2 18 (SUTURE) ×3 IMPLANT
SUT PDS AB 1 CTX 36 (SUTURE) ×12 IMPLANT
SUT PROLENE 2 0 SH DA (SUTURE) IMPLANT
SUT PROLENE 3 0 SH 1 (SUTURE) ×3 IMPLANT
SUT PROLENE 3 0 SH DA (SUTURE) ×12 IMPLANT
SUT PROLENE 3 0 SH1 36 (SUTURE) ×6 IMPLANT
SUT PROLENE 4 0 RB 1 (SUTURE) ×6
SUT PROLENE 4 0 SH DA (SUTURE) ×6 IMPLANT
SUT PROLENE 4-0 RB1 .5 CRCL 36 (SUTURE) ×12 IMPLANT
SUT PROLENE 5 0 C 1 36 (SUTURE) IMPLANT
SUT PROLENE 6 0 C 1 30 (SUTURE) IMPLANT
SUT PROLENE 7.0 RB 3 (SUTURE) ×9 IMPLANT
SUT PROLENE 8 0 BV175 6 (SUTURE) IMPLANT
SUT PROLENE BLUE 7 0 (SUTURE) ×3 IMPLANT
SUT PROLENE POLY MONO (SUTURE) ×6 IMPLANT
SUT SILK  1 MH (SUTURE) ×7
SUT SILK 1 MH (SUTURE) ×14 IMPLANT
SUT STEEL 6MS V (SUTURE) IMPLANT
SUT STEEL STERNAL CCS#1 18IN (SUTURE) IMPLANT
SUT STEEL SZ 6 DBL 3X14 BALL (SUTURE) IMPLANT
SUT VIC AB 1 CTX 36 (SUTURE)
SUT VIC AB 1 CTX36XBRD ANBCTR (SUTURE) IMPLANT
SUT VIC AB 2-0 CT1 27 (SUTURE) ×1
SUT VIC AB 2-0 CT1 TAPERPNT 27 (SUTURE) ×2 IMPLANT
SUT VIC AB 2-0 CTX 27 (SUTURE) IMPLANT
SUT VIC AB 3-0 SH 27 (SUTURE)
SUT VIC AB 3-0 SH 27X BRD (SUTURE) IMPLANT
SUT VIC AB 3-0 X1 27 (SUTURE) IMPLANT
SUT VICRYL 4-0 PS2 18IN ABS (SUTURE) IMPLANT
SYSTEM SAHARA CHEST DRAIN ATS (WOUND CARE) ×6 IMPLANT
SYSTEM SAHARA CHEST DRAIN RE-I (WOUND CARE) ×6 IMPLANT
TAPE CLOTH SURG 4X10 WHT LF (GAUZE/BANDAGES/DRESSINGS) ×3 IMPLANT
TAPE PAPER 3X10 WHT MICROPORE (GAUZE/BANDAGES/DRESSINGS) ×3 IMPLANT
TOWEL GREEN STERILE (TOWEL DISPOSABLE) ×6 IMPLANT
TOWEL GREEN STERILE FF (TOWEL DISPOSABLE) ×6 IMPLANT
TRAY FOLEY SLVR 14FR TEMP STAT (SET/KITS/TRAYS/PACK) ×3 IMPLANT
TRAY FOLEY SLVR 16FR TEMP STAT (SET/KITS/TRAYS/PACK) ×6 IMPLANT
TUBE CONNECTING 12X1/4 (SUCTIONS) ×6 IMPLANT
TUBING INSUFFLATION (TUBING) ×3 IMPLANT
UNDERPAD 30X30 (UNDERPADS AND DIAPERS) ×6 IMPLANT
VALVE MAGNA MITRAL 29MM (Prosthesis & Implant Heart) ×3 IMPLANT
WATER STERILE IRR 1000ML POUR (IV SOLUTION) ×12 IMPLANT
WIRE EMERALD 3MM-J .035X150CM (WIRE) ×3 IMPLANT
YANKAUER SUCT BULB TIP NO VENT (SUCTIONS) ×3 IMPLANT

## 2018-02-18 NOTE — Progress Notes (Signed)
      [address]       [place] [address]             [phone]     CARDIOTHORACIC SURGERY PROGRESS NOTE  Subjective: Jocelyn Hill has been scheduled for Procedure(s) with comments: MITRAL VALVE (MV) REPLACEMENT (N/A) - Glutataldehyde CORONARY ARTERY BYPASS GRAFTING (CABG) (N/A) MAZE (N/A) TRANSESOPHAGEAL ECHOCARDIOGRAM (TEE) (N/A) PLACEMENT OF TEMPORARY HD CATHETER (N/A) today.   Objective: Vital signs in last 24 hours: Temp:  [97.7 F (36.5 C)-98.3 F (36.8 C)] 97.8 F (36.6 C) (08/22 0328) Pulse Rate:  [69] 69 (08/21 0800) Cardiac Rhythm: Normal sinus rhythm (08/22 0406) Resp:  [11-17] 17 (08/22 0328) BP: (144-155)/(72-83) 149/74 (08/22 0328) SpO2:  [95 %] 95 % (08/22 0328) Weight:  [56.5 kg] 56.5 kg (08/22 0443)  Physical Exam: Unchanged from previously   Intake/Output from previous day: 08/21 0701 - 08/22 0700 In: 893.6 [P.O.:720; I.V.:173.6] Out: 300 [Urine:300] Intake/Output this shift: No intake/output data recorded.  Lab Results: Recent Labs    02/17/18 0353 02/18/18 0401  WBC 6.3 6.3  HGB 12.4 11.7*  HCT 36.5 34.3*  PLT 238 241   BMET:  Recent Labs    02/16/18 0416 02/17/18 0353  NA 134* 135  K 4.2 4.2  CL 96* 96*  CO2 28 30  GLUCOSE 168* 136*  BUN 53* 55*  CREATININE 5.06* 5.49*  CALCIUM 10.1 10.1    CBG (last 3)  Recent Labs    02/17/18 1723 02/17/18 2131 02/18/18 0442  GLUCAP 105* 146* 91   PT/INR:   Recent Labs    02/17/18 0353  LABPROT 14.0  INR 1.09    Assessment/Plan:   The various methods of treatment have been discussed with the patient. After consideration of the risks, benefits and treatment options the patient has consented to the planned procedure.   The patient has been seen and labs reviewed. There are no changes in the patient's condition to prevent proceeding with the planned procedure today.   Rexene Alberts, MD 02/18/2018 7:34 AM

## 2018-02-18 NOTE — Anesthesia Procedure Notes (Signed)
Procedure Name: Intubation Date/Time: 02/18/2018 8:02 AM Performed by: Inda Coke, CRNA Pre-anesthesia Checklist: Patient identified, Emergency Drugs available, Suction available and Patient being monitored Patient Re-evaluated:Patient Re-evaluated prior to induction Oxygen Delivery Method: Circle System Utilized Preoxygenation: Pre-oxygenation with 100% oxygen Induction Type: IV induction Ventilation: Mask ventilation without difficulty Laryngoscope Size: Mac and 3 Grade View: Grade I Tube type: Oral Tube size: 8.0 mm Number of attempts: 1 Airway Equipment and Method: Stylet and Oral airway Placement Confirmation: ETT inserted through vocal cords under direct vision,  positive ETCO2 and breath sounds checked- equal and bilateral Secured at: 21 cm Tube secured with: Tape Dental Injury: Teeth and Oropharynx as per pre-operative assessment

## 2018-02-18 NOTE — Anesthesia Procedure Notes (Signed)
Central Venous Catheter Insertion Performed by: Roberts Gaudy, MD, anesthesiologist Start/End8/22/2019 6:55 AM, 02/18/2018 7:05 AM Patient location: Pre-op. Preanesthetic checklist: patient identified, IV checked, site marked, risks and benefits discussed, surgical consent, monitors and equipment checked, pre-op evaluation, timeout performed and anesthesia consent Hand hygiene performed  and maximum sterile barriers used  PA cath was placed.Swan type:thermodilution Procedure performed without using ultrasound guided technique. Attempts: 1 Patient tolerated the procedure well with no immediate complications.

## 2018-02-18 NOTE — Progress Notes (Signed)
Pt now on CPAP/PS per rapid wean protocol. Will draw ABG in 39min.

## 2018-02-18 NOTE — Progress Notes (Signed)
Pt's wean ABG not in parameters, pCO2 53.9. Flipped back to full support by respiratory. Will continue to monitor and attempt wean later. Dr. Roxy Manns aware.

## 2018-02-18 NOTE — Op Note (Signed)
CARDIOTHORACIC SURGERY OPERATIVE NOTE  Date of Procedure:  02/18/2018  Preoperative Diagnosis:   Severe Mitral Regurgitation  Severe Coronary Artery Disease  Recurrent Paroxysmal Atrial Fibrillation  Chronic Kidney Disease  Postoperative Diagnosis:   Severe Mitral Regurgitation  Severe Coronary Artery Disease  Recurrent Paroxysmal Atrial Fibrillation  Chronic Kidney Disease  Patent Foramen Ovale  Procedure:   Mitral Valve Replacement   Edwards Magna Mitral Bovine Bioprosthetic Tissue Valve (size 29 mm, model #7300TFX, serial #[id number])   Maze Procedure   complete bilateral atrial lesion set using bipolar radiofrequency and cryothermy ablation  clipping of left atrial appendage (Atricure Pro245 left atrial clip size 45 mm)   Coronary Artery Bypass Grafting x 2   Left Internal Mammary Artery to Distal Left Anterior Descending Coronary Artery  Saphenous Vein Graft to Distal Left Circumflex Coronary Artery  Endoscopic Vein Harvest from Right Thigh    Closure of Patent Foramen Ovale   Placement of Right Internal Jugular Tridialysis temporary hemodialysis catheter    Surgeon: Valentina Gu. Roxy Manns, MD  Assistant: Ellwood Handler, PA-C  Anesthesia: Roberts Gaudy, MD  Operative Findings:  Rheumatic mitral valve disease  Type IIIA dysfunction with severe mitral regurgitation  Normal left ventricular systolic function  Good quality left internal mammary conduit for grafting  Good quality saphenous vein conduit for grafting  Good quality left anterior descending coronary artery target vessel  Poor quality left circumflex coronary artery target vessel  Patent foramen ovale                BRIEF CLINICAL NOTE AND INDICATIONS FOR SURGERY  Jocelyn Hill is a 67 year old female patient with a past medical history significant for mitral regurgitation, chronic diastolic heart failure, hypothyroidism, coronary artery disease status post myocardial infarction,  hypertension, diabetes mellitus type 2, chronic kidney disease stage IV-V, and history of stroke who has had numerous admissions for heart failure towards the end of 2018.  An echocardiogram was performed September 2018 which showed an estimated ejection fraction of 60 to 65% with severe mitral valve regurgitation.  There was mild to moderate tricuspid valve regurgitation.  She was also diagnosed with paroxysmal atrial fibrillation at this time and started on anticoagulation.  She was admitted again the following month with respiratory failure from [facility].  She was not on a diuretic at this time and had gained 16 pounds at her skilled nursing facility.  She was treated medically with diuretics and discharged.  She slowly recovered and has been followed carefully by Dr. Aundra Dubin in the Advanced Heart Failure clinic ever since.  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  Cardiac catheterization was performed in January 2019 showing 90% ostial left circumflex stenosis and proximal LAD stenosis of at least 60%.  She improved considerably and had been scheduled for an MVR/CABG/MAZE with Dr. Roxy Manns last month but her surgery was postponed because she developed community acquired pneumonia.  She had tentatively been rescheduled for surgery later this month.  She returned to the heart failure clinic for follow-up 02/09/2018 at which time she was having refractory symptoms of chest pain consistent with unstable angina.  She was admitted to [facility] and placed on IV heparin and nitroglycerin drips.   She stabilized clinically and has been prepared for elective surgery.   The patient has been seen in consultation and counseled at  length regarding the indications, risks and potential benefits of surgery.  All questions have been  answered, and the patient provides full informed consent for the operation as described.    DETAILS OF THE OPERATIVE PROCEDURE  Preparation:  The patient is brought to the operating room on the above mentioned date and central monitoring was established by the anesthesia team including placement of Swan-Ganz catheter and radial arterial line. The patient is placed in the supine position on the operating table.  Intravenous antibiotics are administered. General endotracheal anesthesia is induced uneventfully. A Foley catheter is placed.  Baseline transesophageal echocardiogram was performed.  Findings were notable for normal left ventricular systolic function with severe mitral regurgitation.  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  There was a patent foramen ovale.  Right ventricular size and function was normal.  There was trivial tricuspid regurgitation.  The patient's chest, abdomen, both groins, and both lower extremities are prepared and draped in a sterile manner. A time out procedure is performed.   Surgical Approach and Conduit Harvest:  A median sternotomy incision was performed and the left internal mammary artery is dissected from the chest wall and prepared for bypass grafting. The left internal mammary artery is notably good quality conduit. Simultaneously, the greater saphenous vein is obtained from the patient's right thigh using endoscopic vein harvest technique. The saphenous vein is notably good quality conduit. After removal of the saphenous vein, the small surgical incisions in the lower extremity are closed with absorbable suture. Following systemic heparinization, the left internal mammary artery was transected distally noted to have excellent flow.   Extracorporeal Cardiopulmonary Bypass and Myocardial Protection:  The right common femoral vein is cannulated using the Seldinger technique and a guidewire advanced into the right atrium  using TEE guidance.  The right internal jugular vein is cannulated with Seldinger technique under ultrasound guidance and a guidewire advanced into the right atrium.  The patient is heparinized systemically and the femoral vein cannulated using a 22 Fr long femoral venous cannula.  The right internal jugular vein is cannulated with a 14 [place] pediatric femoral venous cannula.  The ascending aorta is cannulated for cardiopulmonary bypass.  Adequate heparinization is verified.   A retrograde cardioplegia cannula is placed through the right atrium into the coronary sinus.  The entire pre-bypass portion of the operation was notable for stable hemodynamics.  Cardiopulmonary bypass was begun and the surface of the heart is inspected.  A cardioplegia cannula is placed in the ascending aorta.  A temperature probe was placed in the interventricular septum.  The patient is cooled to 28C systemic temperature.  The aortic cross clamp is applied and cardioplegia is delivered initially in an antegrade fashion through the aortic root using modified [place] cold blood cardioplegia (Kennestone blood cardioplegia protocol).   The initial cardioplegic arrest is rapid with early diastolic arrest.  Repeat doses of cardioplegia are administered at 90 minutes and every 30 minutes thereafter through the subsequently placed saphenous vein graft and through the coronary sinus catheter in order to maintain completely flat electrocardiogram.  Myocardial protection was felt to be excellent.   Coronary Artery Bypass Grafting:   The distal left circumflex coronary artery was grafted using a reversed saphenous vein graft in an end-to-side fashion.  At the site of distal anastomosis the target vessel was poor quality and measured approximately 1.2 mm in diameter.  The distal left anterior coronary artery was grafted with the left internal mammary artery in  an end-to-side fashion.  At the site of distal anastomosis the target vessel  was good quality and measured approximately 2.0 mm in diameter.   Maze Procedure (left atrial lesion set):  The AtriCure Synergy bipolar radiofrequency ablation clamp is used for all radiofrequency ablation lesions for the maze procedure.  The Atricure CryoICE nitrous oxide cryothermy system is utilized for all cryothermy ablation lesions.   The heart is retracted towards the surgeon's side and the left sided pulmonary veins exposed.  An elliptical ablation lesion is created around the base of the left sided pulmonary veins.  A similar elliptical lesion was created around the base of the left atrial appendage.  The left atrial appendage was obliterated using an Atricure left atrial appendage clip (Atricure Pro245 left atrial clip, size 45 mm).  The heart was replaced into the pericardial sac.  Marland Kitchen  An elliptical ablation lesion is created around the base of the right sided pulmonary veins.  A left atriotomy incision was performed through the interatrial groove and extended partially across the back wall of the left atrium after opening the oblique sinus inferiorly.  The floor of the left atrium and the mitral valve were exposed using a self-retaining retractor.  The mitral valve was inspected.  A bipolar ablation lesion was placed across the dome of the left atrium from the cephalad apex of the atriotomy incision to reach the cephalad apex of the elliptical lesion around the left sided pulmonary veins.  A similar bipolar lesion was placed across the back wall of the left atrium from the caudad apex of the atriotomy incision to reach the caudad apex of the elliptical lesion around the left sided pulmonary veins, thereby completing a box.  Finally another bipolar lesion was placed across the back wall of the left atrium from the caudad apex of the atriotomy incision towards the posterior mitral valve annulus.  This lesion was completed along the endocardial surface onto the posterior mitral annulus with a 3  minute duration cryothermy lesion, followed by a second cryothermy lesion along the posterior epicardial surface of the left atrium across the coronary sinus.   This completes the entire left side lesion set of the Cox maze procedure.   Mitral Valve Replacement:  The mitral valve was inspected and notable for classical rheumatic features.  There was severe foreshortening and fibrosis of the majority of the subvalvular apparatus with cortical fusion and calcification.  There was no mitral stenosis.  The anterior leaflet of the mitral valve was excised sharply, leaving a small rim of the free margin and the associated primary chords.  The posterior leaflet split in the midline.  The mitral annulus was sized to accept a 29 mm prosthesis.  The left ventricle was irrigated with copious cold saline solution.  Mitral valve replacement was performed using interrupted horizontal mattress 2-0 Ethibond pledgeted sutures with pledgets in the supraannular position.  The remaining portions of the anterior leaflet were incorporated into the suture line laterally, thereby preserving chords to both the anterior and posterior leaflet.  An [facility] Mitral bovine bioprosthetic tissue valve (size 29 mm, model # 7300TFX, serial # [id number]) was implanted uneventfully. The valve seated appropriately with care to position the commissure posts away from the left ventricular outflow tract.  Rewarming is begun.   Closure of Patent Foramen Ovale:  The left atrial side of the fossa ovalis was inspected.  There was an obvious patent foramen ovale.  This was closed using simple running 4-0 Prolene suture.  The atriotomy was closed using a 2-layer closure of running 3-0 Prolene suture after placing a sump drain across the mitral valve to serve as a left ventricular vent.    The single proximal vein graft anastomosis was placed directly to the ascending aorta prior to removal of the aortic cross clamp.  The septal myocardial  temperature rose rapidly after reperfusion of the left internal mammary artery graft.  One final dose of warm "reanimation dose" cardioplegia is administered retrograde through the coronary sinus catheter.  The aortic cross clamp was removed after a total cross clamp time of 142 minutes.   Maze Procedure (right atrial lesion set):  The retrograde cardioplegia cannula was removed and the small hole in the right atrium extended a short distance.  The AtriCure Synergy bipolar radiofrequency ablation clamp is utilized to create a series of linear lesions in the right atrium, each with one limb of the clamp along the endocardial surface and the other along the epicardial surface. The first lesion is placed from the posterior apex of the atriotomy incision and along the lateral wall of the right atrium to reach the lateral aspect of the superior vena cava. A second lesion is placed in the opposite direction from the posterior apex of the atriotomy incision along the lateral wall to reach the lateral aspect of the inferior vena cava. A third lesion is placed from the midportion of the atriotomy incision extending at a right angle to reach the tip of the right atrial appendage. A fourth lesion is placed from the anterior apex of the atriotomy incision in an anterior and inferior direction to reach the acute margin of the heart. Finally, the cryotherapy probe is utilized to complete the right atrial lesion set by placing the probe along the endocardial surface of the right atrium from the anterior apex of the atriotomy incision to reach the tricuspid annulus at the 2:00 position. The right atriotomy incision is closed with a 2 layer closure of running 4-0 Prolene suture.   Procedure Completion:  Epicardial pacing wires are fixed to the right ventricular outflow tract and to the right atrial appendage. The patient is rewarmed to 37C temperature. All proximal and distal coronary anastomoses were inspected for  hemostasis and appropriate graft orientation.  The aortic and left ventricular vents are removed.  The patient is weaned and disconnected from cardiopulmonary bypass.  The patient's rhythm at separation from bypass was AV paced.  The patient was weaned from cardiopulmonary bypass on dopamine 3 mcg/kg/min for renal perfusion.  Total cardiopulmonary bypass time for the operation was 173 minutes.  Followup transesophageal echocardiogram performed after separation from bypass revealed a well-seated mitral valve prosthesis that was functioning normally and without any sign of perivalvular leak.  Left ventricular function was unchanged from preoperatively.  The aortic cannula was removed uneventfully. Protamine was administered to reverse the anticoagulation. The femoral venous cannula was removed and manual pressure held on the groin for 30 minutes.  The mediastinum and pleural space were inspected for hemostasis and irrigated with saline solution.  The patient received a total of 2 packs adult platelets and 2 units fresh frozen plasma due to coagulopathy and thrombocytopenia after separation from cardiopulmonary bypass and reversal of heparin with protamine.  The mediastinum and both pleural spaces were drained using 4 chest tubes placed through separate stab incisions inferiorly.  The soft tissues anterior to the aorta were reapproximated loosely. The sternum is closed with double strength sternal wire. The soft tissues anterior to the  sternum were closed in multiple layers and the skin is closed with a running subcuticular skin closure.   The post-bypass portion of the operation was notable for stable rhythm and hemodynamics.  The patient received 2 units packed red blood cells during the procedure due to anemia which was present preoperatively and exacerbated by acute blood loss and hemodilution during cardiopulmonary bypass.     Placement of Right Internal Jugular Tridialysis temporary dialysis  catheter:  The patient's right internal jugular venous cannula was rewired using a flexible guidewire and exchanged for a 13 [place] Tridialysis catheter.  Each of the 3 lumens of the catheter was initially flushed with saline solution and since subsequently flushed with straight heparin.   Patient Disposition:  The patient tolerated the procedure well and is transported to the surgical intensive care in stable condition. There are no intraoperative complications. All sponge instrument and needle counts are verified correct at completion of the operation.     Valentina Gu. Roxy Manns MD 02/18/2018 2:15 PM

## 2018-02-18 NOTE — Progress Notes (Signed)
Rapid wean protocol started at 22:20. Respiratory placed pt on 40/4.

## 2018-02-18 NOTE — Progress Notes (Signed)
  Echocardiogram Echocardiogram Transesophageal has been performed.  Jocelyn Hill 02/18/2018, 8:49 AM

## 2018-02-18 NOTE — Anesthesia Procedure Notes (Signed)
Central Venous Catheter Insertion Performed by: Roberts Gaudy, MD, anesthesiologist Start/End8/22/2019 6:55 AM, 02/18/2018 7:05 AM Patient location: Pre-op. Preanesthetic checklist: patient identified, IV checked, site marked, risks and benefits discussed, surgical consent, monitors and equipment checked, pre-op evaluation, timeout performed and anesthesia consent Lidocaine 1% used for infiltration and patient sedated Hand hygiene performed  and maximum sterile barriers used  Catheter size: 8.5 Fr Sheath introducer Procedure performed using ultrasound guided technique. Ultrasound Notes:anatomy identified, needle tip was noted to be adjacent to the nerve/plexus identified, no ultrasound evidence of intravascular and/or intraneural injection and image(s) printed for medical record Attempts: 1 Following insertion, line sutured and dressing applied. Post procedure assessment: blood return through all ports, free fluid flow and no air  Patient tolerated the procedure well with no immediate complications.

## 2018-02-18 NOTE — Anesthesia Preprocedure Evaluation (Signed)
Anesthesia Evaluation  Patient identified by MRN, date of birth, ID band Patient awake    Reviewed: Allergy & Precautions, NPO status , Patient's Chart, lab work & pertinent test results  Airway Mallampati: II  TM Distance: >3 FB     Dental  (+) Partial Upper, Partial Lower   Pulmonary former smoker,    breath sounds clear to auscultation       Cardiovascular hypertension,  Rhythm:Regular Rate:Normal     Neuro/Psych    GI/Hepatic   Endo/Other  diabetes  Renal/GU      Musculoskeletal   Abdominal   Peds  Hematology   Anesthesia Other Findings   Reproductive/Obstetrics                             Anesthesia Physical Anesthesia Plan  ASA: III  Anesthesia Plan: General   Post-op Pain Management:    Induction: Intravenous  PONV Risk Score and Plan: Ondansetron  Airway Management Planned: Oral ETT  Additional Equipment: Arterial line, CVP, PA Cath, 3D TEE and Ultrasound Guidance Line Placement  Intra-op Plan:   Post-operative Plan: Post-operative intubation/ventilation  Informed Consent: I have reviewed the patients History and Physical, chart, labs and discussed the procedure including the risks, benefits and alternatives for the proposed anesthesia with the patient or authorized representative who has indicated his/her understanding and acceptance.     Plan Discussed with: CRNA and Anesthesiologist  Anesthesia Plan Comments:         Anesthesia Quick Evaluation

## 2018-02-18 NOTE — Progress Notes (Signed)
S: Pt did well with surgery and had a modest response to IV lasix.   O:BP 98/62   Pulse 80   Temp (!) 96.8 F (36 C)   Resp 14   Ht 5\' 9"  (1.753 m)   Wt 56.5 kg   SpO2 100%   BMI 18.39 kg/m   Intake/Output Summary (Last 24 hours) at 02/18/2018 1514 Last data filed at 02/18/2018 1408 Gross per 24 hour  Intake 1536 ml  Output 1280 ml  Net 256 ml   Intake/Output: I/O last 3 completed shifts: In: 893.6 [P.O.:720; I.V.:173.6] Out: 1100 [Urine:1100]  Intake/Output this shift:  Total I/O In: 1262 [Blood:1262] Out: 980 [Urine:380; Blood:600] Weight change: -1.5 kg ZOX:[id number] CVS: RRR no rub Resp: occ rhonchi Abd: +BS, soft, NT Ext: right leg wrapped, trace edema presacral area  Recent Labs  Lab 02/12/18 0253 02/13/18 0229 02/14/18 0253 02/15/18 0404 02/16/18 0416 02/17/18 0353 02/18/18 0807 02/18/18 0954 02/18/18 1019 02/18/18 1139 02/18/18 1320 02/18/18 1439 02/18/18 1508  NA 138 137 138 137 134* 135 136 135 137 133* 135 139 139  K 4.4 4.5 4.7 4.9 4.2 4.2 3.7 3.5 3.0* 3.8 4.2 3.6 3.5  CL 102 99 98 97* 96* 96* 97* 101 98 96* 100  --   --   CO2 27 28 29 26 28 30   --   --   --   --   --   --   --   GLUCOSE 169* 121* 124* 123* 168* 136* 108* 122* 108* 120* 158* 149* 142*  BUN 39* 42* 45* 53* 53* 55* 59* 56* 50* 52* 53*  --   --   CREATININE 4.37* 4.24* 4.52* 4.94* 5.06* 5.49* 5.60* 5.30* 4.80* 5.00* 5.00*  --   --   ALBUMIN 3.8 3.8 4.0 4.1 3.9 4.2  --   --   --   --   --   --   --   CALCIUM 9.9 10.1 10.3 10.2 10.1 10.1  --   --   --   --   --   --   --   PHOS 5.3* 4.9* 4.8* 5.0* 5.1* 5.2*  --   --   --   --   --   --   --   AST  --   --   --   --   --  16  --   --   --   --   --   --   --   ALT  --   --   --   --   --  15  --   --   --   --   --   --   --    Liver Function Tests: Recent Labs  Lab 02/15/18 0404 02/16/18 0416 02/17/18 0353  AST  --   --  16  ALT  --   --  15  ALKPHOS  --   --  96  BILITOT  --   --  0.9  PROT  --   --  7.3  ALBUMIN  4.1 3.9 4.2   No results for input(s): LIPASE, AMYLASE in the last 168 hours. No results for input(s): AMMONIA in the last 168 hours. CBC: Recent Labs  Lab 02/15/18 0404 02/16/18 0409 02/17/18 0353 02/18/18 0401  02/18/18 1205  02/18/18 1439 02/18/18 1441 02/18/18 1508  WBC 8.1 6.6 6.3 6.3  --   --   --   --  6.2  --  HGB 11.6* 11.7* 12.4 11.7*   < > 8.4*   < > 9.9* 5.1* 10.9*  HCT 34.4* 35.9* 36.5 34.3*   < > 25.1*   < > 29.0* 15.8* 32.0*  MCV 78.7 80.0 79.7 79.6  --   --   --   --  85.9  --   PLT 217 228 238 241  --  84*  --   --  59*  --    < > = values in this interval not displayed.   Cardiac Enzymes: Recent Labs  Lab 02/11/18 1516 02/14/18 1224  TROPONINI <0.03 <0.03   CBG: Recent Labs  Lab 02/17/18 0803 02/17/18 1217 02/17/18 1723 02/17/18 2131 02/18/18 0442  GLUCAP 129* 182* 105* 146* 91    Iron Studies: No results for input(s): IRON, TIBC, TRANSFERRIN, FERRITIN in the last 72 hours. Studies/Results: Dg Chest 2 View  Result Date: 02/17/2018 CLINICAL DATA:  Congestive heart failure EXAM: CHEST - 2 VIEW COMPARISON:  02/15/2018 FINDINGS: Heart size upper normal. Resolution of interstitial edema. Lungs are now clear without infiltrate effusion or edema. COPD with hyperinflation. IMPRESSION: Interval resolution of pulmonary edema.  Lungs are now clear. Electronically Signed   By: Franchot Gallo M.D.   On: 02/17/2018 07:30   Dg Chest Port 1 View  Result Date: 02/18/2018 CLINICAL DATA:  Atelectasis. EXAM: PORTABLE CHEST 1 VIEW COMPARISON:  Radiograph of February 17, 2018. FINDINGS: Endotracheal and nasogastric tubes are unchanged in position. Right internal jugular catheter is noted with tip at cavoatrial junction. Left internal jugular catheter is noted with tip in SVC. Left internal jugular catheter is noted with tip in expected position of main pulmonary artery. Status post cardiac valve repair. Status post coronary bypass graft. Bilateral chest tubes are noted  without pneumothorax. No significant pleural effusion is noted. No significant pulmonary abnormality is noted. Bony thorax is unremarkable. IMPRESSION: Endotracheal and nasogastric tubes are in grossly good position. Bilateral chest tubes are noted without pneumothorax. Bilateral internal jugular catheters are noted as well in grossly good position. Electronically Signed   By: Marijo Conception, M.D.   On: 02/18/2018 14:57   . [START ON 02/19/2018] acetaminophen  1,000 mg Oral Q6H   Or  . [START ON 02/19/2018] acetaminophen (TYLENOL) oral liquid 160 mg/5 mL  1,000 mg Per Tube Q6H  . acetaminophen (TYLENOL) oral liquid 160 mg/5 mL  650 mg Per Tube Once   Or  . acetaminophen  650 mg Rectal Once  . [START ON 02/19/2018] aspirin EC  325 mg Oral Daily   Or  . [START ON 02/19/2018] aspirin  324 mg Per Tube Daily  . [START ON 02/19/2018] bisacodyl  10 mg Oral Daily   Or  . [START ON 02/19/2018] bisacodyl  10 mg Rectal Daily  . chlorhexidine  15 mL Mouth/Throat NOW  . [START ON 02/19/2018] docusate sodium  200 mg Oral Daily  . insulin regular  0-10 Units Intravenous TID WC  . levothyroxine  112 mcg Oral QAC breakfast  . [START ON 02/20/2018] pantoprazole  40 mg Oral Daily  . [START ON 02/19/2018] sodium chloride flush  3 mL Intravenous Q12H    BMET    Component Value Date/Time   NA 139 02/18/2018 1508   K 3.5 02/18/2018 1508   CL 100 02/18/2018 1320   CO2 30 02/17/2018 0353   GLUCOSE 142 (H) 02/18/2018 1508   BUN 53 (H) 02/18/2018 1320   CREATININE 5.00 (H) 02/18/2018 1320   CREATININE 3.63 (H) 01/11/2018  1148   CALCIUM 10.1 02/17/2018 0353   CALCIUM 9.7 04/21/2017 0231   GFRNONAA 7 (L) 02/17/2018 0353   GFRAA 8 (L) 02/17/2018 0353   CBC    Component Value Date/Time   WBC 6.2 02/18/2018 1441   RBC 1.84 (L) 02/18/2018 1441   HGB 10.9 (L) 02/18/2018 1508   HCT 32.0 (L) 02/18/2018 1508   PLT 59 (L) 02/18/2018 1441   MCV 85.9 02/18/2018 1441   MCH 27.7 02/18/2018 1441   MCHC 32.3 02/18/2018  1441   RDW 15.3 02/18/2018 1441   LYMPHSABS 0.8 04/18/2017 2251   MONOABS 0.6 04/18/2017 2251   EOSABS 0.2 04/18/2017 2251   BASOSABS 0.1 04/18/2017 2251   HPI: Pt is a 67 y.o. yo female with DM, HTN with CAD, MV dz- also with advanced CKD at baseline- crt around 4 who was admitted on 02/09/2018 with accelerated angina req escalation of surgical correction today, 8/22   Assessment/Plan:  1. 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 CKD stage 5- no indication for HD at this time, however she does have a temporary HD cath in her RIJ placed 02/18/18 by Dr. Roxy Manns in case it is required.  Will continue to follow closely for now 3. HTN/volume- stable for now but volume may become an issue if her UOP does not improve over the next 24 hours 4. Anemia of CKD with ABLA- hgb 5.5 but was 10.9 earlier.  Will recheck to verify and if low transfuse with 120mg  IV lasix between units 5. CMD-SHPTH- stable 6. F/E/N- K stable at 3.6. 7. Vascular access- will need AVF/AVG now that her CAD has been addressed.  Jocelyn Potts, MD [employer] [phone]

## 2018-02-18 NOTE — Anesthesia Procedure Notes (Signed)
Arterial Line Insertion Start/End8/22/2019 7:20 AM, 02/18/2018 7:25 AM Performed by: Roberts Gaudy, MD, anesthesiologist  Preanesthetic checklist: patient identified, IV checked, site marked, risks and benefits discussed, surgical consent, monitors and equipment checked, pre-op evaluation and timeout performed Right, radial was placed  Attempts: 3 Procedure performed without using ultrasound guided technique. Following insertion, Biopatch and dressing applied.

## 2018-02-18 NOTE — Progress Notes (Signed)
Per RN ABG results are acidotic.  Dicussed with RN and  place pt back on full vent support. Will discuss with RN to try rapid wean protocol again later.

## 2018-02-18 NOTE — Brief Op Note (Signed)
02/09/2018 - 02/18/2018  2:06 PM  PATIENT:  Jocelyn Hill  67 y.o. female  PRE-OPERATIVE DIAGNOSIS:  Mitral Regurgitation CAD Atrial Fibrillation  POST-OPERATIVE DIAGNOSIS:   CAD Atrial Fibrillation Mitral Regurgitation  PROCEDURE:  Procedure(s) with comments:  CORONARY ARTERY BYPASS GRAFTING x 2 -LIMA to Left Anterior Descending -SVG to Left Circumflex  MITRAL VALVE (MV) REPLACEMENT  -29 mm Edwards Magna Ease Bioprosthetic Tissue Valve  CLOSURE OF PATENT FORAMEN OVALE  MAZE  -Complete Bi-Atrial Lesion set with Radiofrequency Ablation and Cryothermy  ENDOSCOPIC HARVEST GREATER SAPHENOUS VEIN -Right Leg  PLACEMENT OF TEMPORARY HD CATHETER (N/A)  TRANSESOPHAGEAL ECHOCARDIOGRAM (TEE) (N/A)   SURGEON:  Surgeon(s) and Role: Panel 1:    Rexene Alberts, MD - Primary Panel 2:    * Rexene Alberts, MD - Primary  PHYSICIAN ASSISTANT: Ellwood Handler PA-C  ANESTHESIA:   general  EBL:  600 mL   BLOOD ADMINISTERED:2 units PRBC,  CELLSAVER, 2 FFP and 2 PLTS  DRAINS: Left Pleural Chest Tubes, Mediastinal Chest Drains   LOCAL MEDICATIONS USED:  NONE  SPECIMEN:  Source of Specimen:  Mitral Valve Leaflets  DISPOSITION OF SPECIMEN:  PATHOLOGY  COUNTS:  YES  TOURNIQUET:  * No tourniquets in log *  DICTATION: .Dragon Dictation  PLAN OF CARE: Admit to inpatient   PATIENT DISPOSITION:  ICU - intubated and hemodynamically stable.   Delay start of Pharmacological VTE agent (>24hrs) due to surgical blood loss or risk of bleeding: yes

## 2018-02-18 NOTE — Anesthesia Procedure Notes (Signed)
Central Venous Catheter Insertion Performed by: Roberts Gaudy, MD, anesthesiologist Start/End8/22/2019 6:55 AM, 02/18/2018 7:05 AM Patient location: OR. Preanesthetic checklist: patient identified, IV checked, site marked, risks and benefits discussed, surgical consent, monitors and equipment checked, pre-op evaluation, timeout performed and anesthesia consent Lidocaine 1% used for infiltration and patient sedated Hand hygiene performed  and maximum sterile barriers used  Catheter size: 8 Fr Total catheter length 16. Central line was placed.Double lumen Procedure performed using ultrasound guided technique. Ultrasound Notes:image(s) printed for medical record Attempts: 1 Following insertion, dressing applied and line sutured. Post procedure assessment: blood return through all ports  Patient tolerated the procedure well with no immediate complications.

## 2018-02-18 NOTE — Anesthesia Postprocedure Evaluation (Signed)
Anesthesia Post Note  Patient: Jocelyn Hill  Procedure(s) Performed: MITRAL VALVE (MV) REPLACEMENT (N/A Chest) CORONARY ARTERY BYPASS GRAFTING (CABG) WITH IMA. ENDOSCOPIC VEIN HARVEST. IMA TO LAD, SVG TO CIRC. (N/A Chest) MAZE (N/A ) TRANSESOPHAGEAL ECHOCARDIOGRAM (TEE) (N/A ) PATENT FORAMEN OVALE (PFO) CLOSURE (N/A ) PLACEMENT OF TEMPORARY HD CATHETER, POWER TRIALYSIS 13FR 20CM (N/A )     Patient location during evaluation: SICU Anesthesia Type: General Level of consciousness: sedated and patient remains intubated per anesthesia plan Pain management: pain level controlled Vital Signs Assessment: post-procedure vital signs reviewed and stable Respiratory status: patient remains intubated per anesthesia plan and patient on ventilator - see flowsheet for VS Cardiovascular status: stable Postop Assessment: no apparent nausea or vomiting Anesthetic complications: no    Last Vitals:  Vitals:   02/18/18 2015 02/18/18 2046  BP: 129/77   Pulse: 80 80  Resp: 20 17  Temp: (!) 36.4 C   SpO2: 98% 99%    Last Pain:  Vitals:   02/18/18 0406  TempSrc:   PainSc: 2                  Quinlee Sciarra COKER

## 2018-02-18 NOTE — Progress Notes (Signed)
Per RN, ABG acidotic.  Attempted to perform weaning parameters.  Pt unable to perform NIF and VC, pt is awake but sluggish, difficulty holding head off bed for >5 seconds.  Pt placed back on full vent support d/t this and discussion w/ RN.

## 2018-02-18 NOTE — Transfer of Care (Signed)
Immediate Anesthesia Transfer of Care Note  Patient: Jocelyn Hill  Procedure(s) Performed: MITRAL VALVE (MV) REPLACEMENT (N/A Chest) CORONARY ARTERY BYPASS GRAFTING (CABG) WITH IMA. ENDOSCOPIC VEIN HARVEST. IMA TO LAD, SVG TO CIRC. (N/A Chest) MAZE (N/A ) TRANSESOPHAGEAL ECHOCARDIOGRAM (TEE) (N/A ) PATENT FORAMEN OVALE (PFO) CLOSURE (N/A ) PLACEMENT OF TEMPORARY HD CATHETER, POWER TRIALYSIS 13FR 20CM (N/A )  Patient Location: SICU  Anesthesia Type:General  Level of Consciousness: Patient remains intubated per anesthesia plan  Airway & Oxygen Therapy: Patient remains intubated per anesthesia plan and Patient placed on Ventilator (see vital sign flow sheet for setting)  Post-op Assessment: Report given to RN and Post -op Vital signs reviewed and stable  Post vital signs: Reviewed and stable  Last Vitals:  Vitals Value Taken Time  BP 101/63 02/18/2018  2:45 PM  Temp 36.2 C 02/18/2018  2:46 PM  Pulse 80 02/18/2018  2:46 PM  Resp 16 02/18/2018  2:46 PM  SpO2 99 % 02/18/2018  2:46 PM  Vitals shown include unvalidated device data.  Last Pain:  Vitals:   02/18/18 0406  TempSrc:   PainSc: 2       Patients Stated Pain Goal: 0 (65/46/50 3546)  Complications: No apparent anesthesia complications

## 2018-02-18 NOTE — Plan of Care (Signed)
  Problem: Education: Goal: Knowledge of General Education information will improve Description Including pain rating scale, medication(s)/side effects and non-pharmacologic comfort measures Outcome: Progressing   Problem: Health Behavior/Discharge Planning: Goal: Ability to manage health-related needs will improve Outcome: Progressing   Problem: Clinical Measurements: Goal: Ability to maintain clinical measurements within normal limits will improve Outcome: Progressing Goal: Will remain free from infection Outcome: Progressing Goal: Diagnostic test results will improve Outcome: Progressing Goal: Respiratory complications will improve Outcome: Progressing Goal: Cardiovascular complication will be avoided Outcome: Progressing   Problem: Activity: Goal: Risk for activity intolerance will decrease Outcome: Progressing   Problem: Nutrition: Goal: Adequate nutrition will be maintained Outcome: Progressing   Problem: Coping: Goal: Level of anxiety will decrease Outcome: Progressing   Problem: Elimination: Goal: Will not experience complications related to bowel motility Outcome: Progressing Goal: Will not experience complications related to urinary retention Outcome: Progressing   Problem: Safety: Goal: Ability to remain free from injury will improve Outcome: Progressing   Problem: Skin Integrity: Goal: Risk for impaired skin integrity will decrease Outcome: Progressing   Problem: Education: Goal: Will demonstrate proper wound care and an understanding of methods to prevent future damage Outcome: Progressing Goal: Knowledge of disease or condition will improve Outcome: Progressing Goal: Knowledge of the prescribed therapeutic regimen will improve Outcome: Progressing Goal: Individualized Educational Video(s) Outcome: Progressing   Problem: Activity: Goal: Risk for activity intolerance will decrease Outcome: Progressing   Problem: Cardiac: Goal: Will achieve  and/or maintain hemodynamic stability Outcome: Progressing   Problem: Clinical Measurements: Goal: Postoperative complications will be avoided or minimized Outcome: Progressing   Problem: Respiratory: Goal: Respiratory status will improve Outcome: Progressing   Problem: Skin Integrity: Goal: Wound healing without signs and symptoms of infection Outcome: Progressing Goal: Risk for impaired skin integrity will decrease Outcome: Progressing   Problem: Urinary Elimination: Goal: Ability to achieve and maintain adequate renal perfusion and functioning will improve Outcome: Progressing

## 2018-02-18 NOTE — Progress Notes (Signed)
SICU vent wean protocol initiated.

## 2018-02-18 NOTE — Progress Notes (Addendum)
[facility] BRIEF SICU PROGRESS NOTE  Day of Surgery  S/P Procedure(s) (LRB): MITRAL VALVE (MV) REPLACEMENT (N/A) CORONARY ARTERY BYPASS GRAFTING (CABG) WITH IMA. ENDOSCOPIC VEIN HARVEST. IMA TO LAD, SVG TO CIRC. (N/A) MAZE (N/A) TRANSESOPHAGEAL ECHOCARDIOGRAM (TEE) (N/A) PLACEMENT OF TEMPORARY HD CATHETER, POWER TRIALYSIS 13FR 20CM (N/A) PATENT FORAMEN OVALE (PFO) CLOSURE (N/A)   Starting to wake on vent AV paced w/ stable hemodynamics on renal dopamine O2 sats 98-100% Chest tube output low UOP remarkably good Labs okay w/ Hgb 10.7 and potassium 3.5  Plan: Continue routine early postop.  Watch UOP, electrolytes  Rexene Alberts, MD 02/18/2018 8:14 PM

## 2018-02-18 NOTE — Progress Notes (Signed)
Pt flipped to 40/4 by respiratory at 19:50 per rapid wean protocol.

## 2018-02-18 NOTE — Progress Notes (Signed)
PHARMACY NOTE:  ANTIMICROBIAL RENAL DOSAGE ADJUSTMENT  Current antimicrobial regimen includes a mismatch between antimicrobial dosage and estimated renal function.  As per policy approved by the Pharmacy & Therapeutics and Medical Executive Committees, the antimicrobial dosage will be adjusted accordingly.  Current antimicrobial dosage:  Zinacef 1.5g IV q12h x4  Indication: Surgical prophylaxis  Renal Function: CrCl ~10 ml/min  Estimated Creatinine Clearance: 9.9 mL/min (A) (by C-G formula based on SCr of 5 mg/dL (H)). []      On intermittent HD, scheduled: []      On CRRT    Antimicrobial dosage has been changed to:  Zinacef 1.5g IV q24h x2   Additional comments: This will provide continued surgical prophylaxis x48 hours based upon patients CrCl.   Thank you for allowing pharmacy to be a part of this patient's care.  Arrie Senate, PharmD, BCPS Clinical Pharmacist [phone] Please check AMION for all [facility] numbers 02/18/2018

## 2018-02-19 ENCOUNTER — Encounter (HOSPITAL_COMMUNITY): Payer: Self-pay | Admitting: Thoracic Surgery (Cardiothoracic Vascular Surgery)

## 2018-02-19 ENCOUNTER — Inpatient Hospital Stay (HOSPITAL_COMMUNITY): Payer: Medicare Other

## 2018-02-19 LAB — BPAM PLATELET PHERESIS
Blood Product Expiration Date: 201908222359
Blood Product Expiration Date: 201908222359
ISSUE DATE / TIME: 201908221225
ISSUE DATE / TIME: 201908221225
Unit Type and Rh: 5100
Unit Type and Rh: 7300

## 2018-02-19 LAB — PREPARE FRESH FROZEN PLASMA
Unit division: 0
Unit division: 0

## 2018-02-19 LAB — GLUCOSE, CAPILLARY
Glucose-Capillary: 110 mg/dL — ABNORMAL HIGH (ref 70–99)
Glucose-Capillary: 117 mg/dL — ABNORMAL HIGH (ref 70–99)
Glucose-Capillary: 122 mg/dL — ABNORMAL HIGH (ref 70–99)
Glucose-Capillary: 127 mg/dL — ABNORMAL HIGH (ref 70–99)
Glucose-Capillary: 144 mg/dL — ABNORMAL HIGH (ref 70–99)
Glucose-Capillary: 158 mg/dL — ABNORMAL HIGH (ref 70–99)
Glucose-Capillary: 158 mg/dL — ABNORMAL HIGH (ref 70–99)
Glucose-Capillary: 87 mg/dL (ref 70–99)

## 2018-02-19 LAB — BASIC METABOLIC PANEL
Anion gap: 8 (ref 5–15)
BUN: 56 mg/dL — ABNORMAL HIGH (ref 8–23)
CO2: 25 mmol/L (ref 22–32)
Calcium: 9.1 mg/dL (ref 8.9–10.3)
Chloride: 106 mmol/L (ref 98–111)
Creatinine, Ser: 4.4 mg/dL — ABNORMAL HIGH (ref 0.44–1.00)
GFR calc Af Amer: 11 mL/min — ABNORMAL LOW (ref >60)
GFR calc non Af Amer: 10 mL/min — ABNORMAL LOW (ref >60)
Glucose, Bld: 148 mg/dL — ABNORMAL HIGH (ref 70–99)
Potassium: 4 mmol/L (ref 3.5–5.1)
Sodium: 139 mmol/L (ref 135–145)

## 2018-02-19 LAB — POCT I-STAT 3, ART BLOOD GAS (G3+)
Acid-base deficit: 2 mmol/L (ref 0.0–2.0)
Acid-base deficit: 4 mmol/L — ABNORMAL HIGH (ref 0.0–2.0)
Acid-base deficit: 5 mmol/L — ABNORMAL HIGH (ref 0.0–2.0)
Bicarbonate: 23.8 mmol/L (ref 20.0–28.0)
Bicarbonate: 23.2 mmol/L (ref 20.0–28.0)
Bicarbonate: 21.3 mmol/L (ref 20.0–28.0)
O2 Saturation: 96 %
O2 Saturation: 94 %
O2 Saturation: 97 %
Patient temperature: 37.3
Patient temperature: 37.6
TCO2: 25 mmol/L (ref 22–32)
TCO2: 25 mmol/L (ref 22–32)
TCO2: 23 mmol/L (ref 22–32)
pCO2 arterial: 43.8 mmHg (ref 32.0–48.0)
pCO2 arterial: 50.4 mmHg — ABNORMAL HIGH (ref 32.0–48.0)
pCO2 arterial: 43.6 mmHg (ref 32.0–48.0)
pH, Arterial: 7.345 — ABNORMAL LOW (ref 7.350–7.450)
pH, Arterial: 7.273 — ABNORMAL LOW (ref 7.350–7.450)
pH, Arterial: 7.297 — ABNORMAL LOW (ref 7.350–7.450)
pO2, Arterial: 90 mmHg (ref 83.0–108.0)
pO2, Arterial: 85 mmHg (ref 83.0–108.0)
pO2, Arterial: 99 mmHg (ref 83.0–108.0)

## 2018-02-19 LAB — PREPARE PLATELET PHERESIS
Unit division: 0
Unit division: 0

## 2018-02-19 LAB — CBC
HCT: 35 % — ABNORMAL LOW (ref 36.0–46.0)
HCT: 33.8 % — ABNORMAL LOW (ref 36.0–46.0)
Hemoglobin: 11.8 g/dL — ABNORMAL LOW (ref 12.0–15.0)
Hemoglobin: 11.3 g/dL — ABNORMAL LOW (ref 12.0–15.0)
MCH: 27.8 pg (ref 26.0–34.0)
MCH: 27.8 pg (ref 26.0–34.0)
MCHC: 33.7 g/dL (ref 30.0–36.0)
MCHC: 33.4 g/dL (ref 30.0–36.0)
MCV: 82.5 fL (ref 78.0–100.0)
MCV: 83 fL (ref 78.0–100.0)
Platelets: 119 10*3/uL — ABNORMAL LOW (ref 150–400)
Platelets: 93 10*3/uL — ABNORMAL LOW (ref 150–400)
RBC: 4.24 MIL/uL (ref 3.87–5.11)
RBC: 4.07 MIL/uL (ref 3.87–5.11)
RDW: 15.2 % (ref 11.5–15.5)
RDW: 15.3 % (ref 11.5–15.5)
WBC: 16.7 10*3/uL — ABNORMAL HIGH (ref 4.0–10.5)
WBC: 18.1 10*3/uL — ABNORMAL HIGH (ref 4.0–10.5)

## 2018-02-19 LAB — POCT I-STAT, CHEM 8
BUN: 53 mg/dL — ABNORMAL HIGH (ref 8–23)
Calcium, Ion: 1.3 mmol/L (ref 1.15–1.40)
Chloride: 102 mmol/L (ref 98–111)
Creatinine, Ser: 5.1 mg/dL — ABNORMAL HIGH (ref 0.44–1.00)
Glucose, Bld: 207 mg/dL — ABNORMAL HIGH (ref 70–99)
HCT: 34 % — ABNORMAL LOW (ref 36.0–46.0)
Hemoglobin: 11.6 g/dL — ABNORMAL LOW (ref 12.0–15.0)
Potassium: 3.9 mmol/L (ref 3.5–5.1)
Sodium: 139 mmol/L (ref 135–145)
TCO2: 23 mmol/L (ref 22–32)

## 2018-02-19 LAB — BLOOD GAS, ARTERIAL
Acid-base deficit: 3.8 mmol/L — ABNORMAL HIGH (ref 0.0–2.0)
Bicarbonate: 21.1 mmol/L (ref 20.0–28.0)
Drawn by: 246101
FIO2: 40
O2 Saturation: 93.3 %
PEEP: 5 cmH2O
Patient temperature: 98.6
Pressure support: 10 cmH2O
pCO2 arterial: 41.1 mmHg (ref 32.0–48.0)
pH, Arterial: 7.331 — ABNORMAL LOW (ref 7.350–7.450)
pO2, Arterial: 69.5 mmHg — ABNORMAL LOW (ref 83.0–108.0)

## 2018-02-19 LAB — BPAM FFP
Blood Product Expiration Date: 201908232359
Blood Product Expiration Date: 201908232359
ISSUE DATE / TIME: 201908221226
ISSUE DATE / TIME: 201908221226
Unit Type and Rh: 5100
Unit Type and Rh: 5100

## 2018-02-19 LAB — CREATININE, SERUM
Creatinine, Ser: 4.76 mg/dL — ABNORMAL HIGH (ref 0.44–1.00)
GFR calc Af Amer: 10 mL/min — ABNORMAL LOW (ref >60)
GFR calc non Af Amer: 9 mL/min — ABNORMAL LOW (ref >60)

## 2018-02-19 LAB — MAGNESIUM
Magnesium: 2.7 mg/dL — ABNORMAL HIGH (ref 1.7–2.4)
Magnesium: 2.8 mg/dL — ABNORMAL HIGH (ref 1.7–2.4)

## 2018-02-19 MED ORDER — ATORVASTATIN CALCIUM 80 MG PO TABS
80.0000 mg | ORAL_TABLET | Freq: Every day | ORAL | Status: DC
  Administered 2018-02-20 – 2018-03-05 (×14): 80 mg via ORAL
  Filled 2018-02-19 (×14): qty 1

## 2018-02-19 MED ORDER — NITROGLYCERIN IN D5W 200-5 MCG/ML-% IV SOLN
0.0000 ug/min | INTRAVENOUS | Status: DC
  Administered 2018-02-20 (×2): 75 ug/min via INTRAVENOUS
  Administered 2018-02-20 – 2018-02-21 (×3): 80 ug/min via INTRAVENOUS
  Filled 2018-02-19 (×5): qty 250

## 2018-02-19 MED ORDER — ENOXAPARIN SODIUM 30 MG/0.3ML ~~LOC~~ SOLN
30.0000 mg | Freq: Every day | SUBCUTANEOUS | Status: DC
  Administered 2018-02-20 – 2018-03-02 (×11): 30 mg via SUBCUTANEOUS
  Filled 2018-02-19 (×11): qty 0.3

## 2018-02-19 MED ORDER — CALCIUM CARBONATE ANTACID 500 MG PO CHEW
2.0000 | CHEWABLE_TABLET | Freq: Three times a day (TID) | ORAL | Status: DC | PRN
  Administered 2018-03-05: 400 mg via ORAL
  Filled 2018-02-19: qty 2

## 2018-02-19 MED ORDER — ALPRAZOLAM 0.25 MG PO TABS
0.2500 mg | ORAL_TABLET | Freq: Every evening | ORAL | Status: DC | PRN
  Administered 2018-02-20 – 2018-03-03 (×12): 0.25 mg via ORAL
  Filled 2018-02-19 (×12): qty 1

## 2018-02-19 MED ORDER — BUPROPION HCL 100 MG PO TABS
100.0000 mg | ORAL_TABLET | Freq: Three times a day (TID) | ORAL | Status: DC
  Administered 2018-02-20 – 2018-03-05 (×40): 100 mg via ORAL
  Filled 2018-02-19 (×45): qty 1

## 2018-02-19 MED ORDER — CALCIUM CARBONATE ANTACID 750 MG PO CHEW: 2.0000 | CHEWABLE_TABLET | ORAL | Status: DC | PRN

## 2018-02-19 MED ORDER — ORAL CARE MOUTH RINSE
15.0000 mL | Freq: Two times a day (BID) | OROMUCOSAL | Status: DC
  Administered 2018-02-20 – 2018-03-05 (×14): 15 mL via OROMUCOSAL

## 2018-02-19 MED ORDER — PANTOPRAZOLE SODIUM 40 MG PO TBEC: 40.0000 mg | DELAYED_RELEASE_TABLET | Freq: Every day | ORAL | Status: DC

## 2018-02-19 MED ORDER — FUROSEMIDE 10 MG/ML IJ SOLN
120.0000 mg | Freq: Two times a day (BID) | INTRAVENOUS | Status: DC
  Administered 2018-02-19 – 2018-02-25 (×13): 120 mg via INTRAVENOUS
  Filled 2018-02-19: qty 10
  Filled 2018-02-19: qty 12
  Filled 2018-02-19: qty 10
  Filled 2018-02-19: qty 12
  Filled 2018-02-19: qty 10
  Filled 2018-02-19 (×3): qty 12
  Filled 2018-02-19 (×2): qty 10
  Filled 2018-02-19 (×2): qty 12
  Filled 2018-02-19: qty 10
  Filled 2018-02-19: qty 12
  Filled 2018-02-19: qty 10
  Filled 2018-02-19: qty 2

## 2018-02-19 MED ORDER — MONTELUKAST SODIUM 10 MG PO TABS
10.0000 mg | ORAL_TABLET | Freq: Every day | ORAL | Status: DC
  Administered 2018-02-19 – 2018-03-05 (×15): 10 mg via ORAL
  Filled 2018-02-19 (×15): qty 1

## 2018-02-19 MED FILL — Sodium Bicarbonate IV Soln 8.4%: INTRAVENOUS | Qty: 150 | Status: AC

## 2018-02-19 MED FILL — Albumin, Human Inj 5%: INTRAVENOUS | Qty: 250 | Status: AC

## 2018-02-19 MED FILL — Mannitol IV Soln 20%: INTRAVENOUS | Qty: 1000 | Status: AC

## 2018-02-19 MED FILL — Heparin Sodium (Porcine) Inj 1000 Unit/ML: INTRAMUSCULAR | Qty: 10 | Status: AC

## 2018-02-19 MED FILL — Potassium Chloride Inj 2 mEq/ML: INTRAVENOUS | Qty: 40 | Status: AC

## 2018-02-19 MED FILL — Lidocaine HCl(Cardiac) IV PF Soln Pref Syr 100 MG/5ML (2%): INTRAVENOUS | Qty: 25 | Status: AC

## 2018-02-19 MED FILL — Electrolyte-R (PH 7.4) Solution: INTRAVENOUS | Qty: 4000 | Status: AC

## 2018-02-19 MED FILL — Magnesium Sulfate Inj 50%: INTRAMUSCULAR | Qty: 10 | Status: AC

## 2018-02-19 MED FILL — Phenylephrine HCl IV Soln 10 MG/ML: INTRAVENOUS | Qty: 2 | Status: AC

## 2018-02-19 MED FILL — Heparin Sodium (Porcine) Inj 1000 Unit/ML: INTRAMUSCULAR | Qty: 30 | Status: AC

## 2018-02-19 MED FILL — Calcium Chloride Inj 10%: INTRAVENOUS | Qty: 10 | Status: AC

## 2018-02-19 MED FILL — Sodium Chloride IV Soln 0.9%: INTRAVENOUS | Qty: 2000 | Status: AC

## 2018-02-19 MED FILL — Sodium Chloride IV Soln 0.9%: INTRAVENOUS | Qty: 250 | Status: AC

## 2018-02-19 NOTE — Progress Notes (Addendum)
Patient ID: Jocelyn Hill, female   DOB: May 04, 1951, 67 y.o.   MRN: [id number]     Advanced Heart Failure Rounding Note  PCP-Cardiologist: No primary care provider on file.   Subjective:    S/P CABG, MVR, and MAZE 3/71/69 with no complications.   Renal following closely. She has temp HD cath in RIJ, but no indication for HD so far.   She has been acidotic with any vent wean. FiO2 40%. Attempting vent wean again this am.   Creatinine trending down 4.4, modest UOP, weight up 20 lbs  WBC 16.7, hemoglobin 11.8, tmax 100.6  ABG: pH 7.27, CO2 50.4, O2 85, bicarb 23  CXR this am shows small left apical pneumothorax  She is on dopa @ 3 and nitro @ 25. Intubated. Sedation currently off. Awakens easily and follows commands.   AV-pacing with complete heart block underlying.   [place]wan #s: PA: 29/18 (23) CVP: 10 CO: 3.4 CI: 2.0  Objective:   Weight Range: 65.6 kg Body mass index is 21.36 kg/m.   Vital Signs:   Temp:  [96.6 F (35.9 C)-100.6 F (38.1 C)] 100.6 F (38.1 C) (08/23 0700) Pulse Rate:  [69-81] 80 (08/23 0745) Resp:  [12-25] 19 (08/23 0745) BP: (98-158)/(50-111) 150/89 (08/23 0700) SpO2:  [93 %-100 %] 100 % (08/23 0745) Arterial Line BP: (74-132)/(51-122) 122/119 (08/23 0345) FiO2 (%):  [40 %-50 %] 40 % (08/23 0745) Weight:  [65.6 kg] 65.6 kg (08/23 0500) Last BM Date: 02/08/18  Weight change: Filed Weights   02/17/18 0418 02/18/18 0443 02/19/18 0500  Weight: 55.2 kg 56.5 kg 65.6 kg    Intake/Output:   Intake/Output Summary (Last 24 hours) at 02/19/2018 0813 Last data filed at 02/19/2018 0700 Gross per 24 hour  Intake 3468.76 ml  Output 3400 ml  Net 68.76 ml      Physical Exam    General: Intubated/sedated.  HEENT: + ETT Neck: Supple. JVP 10. Carotids 2+ bilat; no bruits. No thyromegaly or nodule noted. Left IJ swan. Rt IJ HD cath Cor: PMI nondisplaced. RRR, 3/6 MR 2 chest tubes present, sternal dressing CDI. Pacing wires present Lungs: clear  throughout anteriorly Abdomen: Soft, non-tender, non-distended, no HSM. No bruits or masses. +BS  Extremities: No cyanosis, clubbing, or rash. No edema. Vein graft dressing on RLE, CDI Neuro: Intubated/sedated   Telemetry   AV pacing 80. Personally reviewed.   EKG    Complete heart block c LBBB, 50 bpm. QTc 457. Personally reviewed.   Labs    CBC Recent Labs    02/18/18 2104 02/19/18 0341  WBC 17.7* 16.7*  NEUTROABS 16.0*  --   HGB 11.9* 11.8*  HCT 35.6* 35.0*  MCV 82.4 82.5  PLT 141* 678*   Basic Metabolic Panel Recent Labs    02/17/18 0353  02/18/18 2059 02/18/18 2104 02/19/18 0341  NA 135   < > 140  --  139  K 4.2   < > 3.8  --  4.0  CL 96*   < > 102  --  106  CO2 30  --   --   --  25  GLUCOSE 136*   < > 115*  --  148*  BUN 55*   < > 52*  --  56*  CREATININE 5.49*   < > 4.60* 4.64* 4.40*  CALCIUM 10.1  --   --   --  9.1  MG  --   --   --  3.1* 2.7*  PHOS 5.2*  --   --   --   --    < > =  values in this interval not displayed.   Liver Function Tests Recent Labs    02/17/18 0353  AST 16  ALT 15  ALKPHOS 96  BILITOT 0.9  PROT 7.3  ALBUMIN 4.2   No results for input(s): LIPASE, AMYLASE in the last 72 hours. Cardiac Enzymes No results for input(s): CKTOTAL, CKMB, CKMBINDEX, TROPONINI in the last 72 hours.  BNP: BNP (last 3 results) Recent Labs    03/12/17 1400 02/09/18 1547 02/17/18 0353  BNP 2,230.5* 1,320.1* 675.7*    ProBNP (last 3 results) No results for input(s): PROBNP in the last 8760 hours.   D-Dimer No results for input(s): DDIMER in the last 72 hours. Hemoglobin A1C Recent Labs    02/17/18 0353  HGBA1C 6.0*   Fasting Lipid Panel No results for input(s): CHOL, HDL, LDLCALC, TRIG, CHOLHDL, LDLDIRECT in the last 72 hours. Thyroid Function Tests No results for input(s): TSH, T4TOTAL, T3FREE, THYROIDAB in the last 72 hours.  Invalid input(s): FREET3  Other results:   Imaging    Dg Chest Port 1 View  Result Date:  02/19/2018 CLINICAL DATA:  Intubation. EXAM: PORTABLE CHEST 1 VIEW COMPARISON:  02/19/2007 FINDINGS: Endotracheal tube, bilateral IJ lines, subclavian line, bilateral chest tubes, mediastinal drainage catheters in stable position. Prior CABG. Prior cardiac valve replacement. Left atrial appendage clip in stable position. Stable cardiomegaly. Mild bilateral mid and lower lung field subsegmental atelectasis again noted. No pleural effusion. Small left pneumothorax. IMPRESSION: 1. Lines and tubes including bilateral chest tubes in stable position. Small left apical pneumothorax. 2. Prior CABG. Prior cardiac valve replacement. Left atrial appendage clip in stable position. Stable cardiomegaly. No pulmonary venous congestion. 3. Stable mild bilateral mid lung and bibasilar subsegmental atelectasis. Critical Value/emergent results were called by telephone at the time of interpretation on 02/19/2018 at 7:20 am to nurse Emer , who verbally acknowledged these results. Electronically Signed   By: Marcello Moores  Register   On: 02/19/2018 07:22   Dg Chest Port 1 View  Result Date: 02/18/2018 CLINICAL DATA:  Atelectasis. EXAM: PORTABLE CHEST 1 VIEW COMPARISON:  Radiograph of February 17, 2018. FINDINGS: Endotracheal and nasogastric tubes are unchanged in position. Right internal jugular catheter is noted with tip at cavoatrial junction. Left internal jugular catheter is noted with tip in SVC. Left internal jugular catheter is noted with tip in expected position of main pulmonary artery. Status post cardiac valve repair. Status post coronary bypass graft. Bilateral chest tubes are noted without pneumothorax. No significant pleural effusion is noted. No significant pulmonary abnormality is noted. Bony thorax is unremarkable. IMPRESSION: Endotracheal and nasogastric tubes are in grossly good position. Bilateral chest tubes are noted without pneumothorax. Bilateral internal jugular catheters are noted as well in grossly good position.  Electronically Signed   By: Marijo Conception, M.D.   On: 02/18/2018 14:57     Medications:     Scheduled Medications: . acetaminophen  1,000 mg Oral Q6H   Or  . acetaminophen (TYLENOL) oral liquid 160 mg/5 mL  1,000 mg Per Tube Q6H  . aspirin EC  325 mg Oral Daily   Or  . aspirin  324 mg Per Tube Daily  . bisacodyl  10 mg Oral Daily   Or  . bisacodyl  10 mg Rectal Daily  . chlorhexidine gluconate (MEDLINE KIT)  15 mL Mouth Rinse BID  . Chlorhexidine Gluconate Cloth  6 each Topical Daily  . docusate sodium  200 mg Oral Daily  . insulin regular  0-10 Units Intravenous TID WC  .  levothyroxine  112 mcg Oral QAC breakfast  . mouth rinse  15 mL Mouth Rinse 10 times per day  . [START ON 02/20/2018] pantoprazole  40 mg Oral Daily  . sodium chloride flush  3 mL Intravenous Q12H    Infusions: . sodium chloride Stopped (02/19/18 0627)  . sodium chloride    . sodium chloride 10 mL/hr at 02/18/18 1515  . albumin human    . cefUROXime (ZINACEF)  IV    . dexmedetomidine (PRECEDEX) IV infusion Stopped (02/18/18 1646)  . DOPamine 3 mcg/kg/min (02/19/18 0700)  . furosemide 62 mL/hr at 02/19/18 0700  . insulin (NOVOLIN-R) infusion 0.6 mL/hr at 02/19/18 0700  . lactated ringers    . lactated ringers    . lactated ringers 10 mL/hr at 02/19/18 0700  . nitroGLYCERIN 25 mcg/min (02/19/18 0700)  . phenylephrine (NEO-SYNEPHRINE) Adult infusion Stopped (02/18/18 1522)    PRN Medications: sodium chloride, albumin human, ALPRAZolam, lactated ringers, metoprolol tartrate, midazolam, morphine injection, ondansetron (ZOFRAN) IV, oxyCODONE, sodium chloride flush, sodium chloride flush, traMADol   Assessment/Plan   1. CAD s/p CABG, MVR, MAZE 02/18/18:  - LHC in 1/19 showed severe ostial LCx disease and moderate LAD disease. She will need CABG with MVR. - Continue ASA 324 daily per Dr Roxy Manns. May need additional DVT prophylaxis. - Continue dopa @ 3. CI 2.0 this am per RN - Continue nitro drip for BP  control - Ranexa on hold. Dr. Aundra Dubin discussed with pharmacy, not well studied with CKD IV but not contra-indicated. Will follow ECG daily for QT interval => QTc 457 msec today, keep < 500.   - Hold Coreg with complete heart block on telemetry.  2. 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 Stable NYHA class III dyspnea.  This is complicated by CKD stage IV.  - Volume status elevated post CABG. Weight up 20 lbs.  - Continue lasix 120 mg IV BID -Creatinine trending down 4.4. Renal following closely. Has HD cath.  3. CKD Stage IV:  - Creatinine trending down 4.4 - HD cath in place. No HD yet. Renal following.  4. HTN: - Continue nitro drip. SBP 140s 5. Mitral regurgitation s/p MVR 02/18/18:  - Severe. 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 Confirmed on 4/19 TEE.   - S/p MVR 6. Atrial fibrillation: Paroxysmal s/p MAZE.  - NSR => developed junctional rhythm on 8/17. Carvedilol held. She had a spontaneous RP hemorrhage in 9/18 but has been back on Eliquis at home.   - Remains on heparin (off Eliquis) due to active chest pain and plan for surgery this week.  - She has been off amiodarone due to h/o junctional bradycardia.  - s/p MAZE 02/18/18 7. Junctional rhythm/complete heart block:  - Now with complete heart block underlying - AV pacing with epicardial pacer. Will likely need PPM 8. Constipation:  - No change. 9. Anxiety:  - Intubated/sedated 10. Deconditioning - Will need PT/OT once extubated.  11. Acute respiratory failure - Now on FiO2 40%. Attempting vent wean again this am. Failed overnight with acidosis 12. Pneumothorax - Small left apical pneumo noted on CXR this am   Length of Stay: [place], NP  02/19/2018, 8:13 AM  Advanced Heart Failure Team Pager [phone] (M-F; 7a - 4p)  Please contact [facility] for night-coverage after hours (4p -7a ) and weekends on [email or web address]  Patient seen and examined with the above-signed Advanced Practice Provider and/or  Housestaff. I personally reviewed laboratory data, imaging studies and relevant  notes. I independently examined the patient and formulated the important aspects of the plan. I have edited the note to reflect any of my changes or salient points. I have personally discussed the plan with the patient and/or family.  Extubated today. Remains volume overloaded but responding to lasix. On dopa 3. Creatinine stable at 5.1. Has CHB and now AV pacing. Weight up 20. Will continue diuresis. Has dialysis cath as needed. Will likely need PPM and permanent HD access. Small PTX on CXR. Will follow.   Glori Bickers, MD  8:29 PM

## 2018-02-19 NOTE — Progress Notes (Signed)
      [address]       [place] [address]             [phone]   POD # 1 CABG, MVR, maze  BP (!) 144/78   Pulse 80   Temp 97.9 F (36.6 C)   Resp 17   Ht 5\' 9"  (1.753 m)   Wt 65.6 kg   SpO2 99%   BMI 21.36 kg/m  4L [place]  NTG at 25  Intake/Output Summary (Last 24 hours) at 02/19/2018 1808 Last data filed at 02/19/2018 1700 Gross per 24 hour  Intake 2133.66 ml  Output 2600 ml  Net -466.34 ml     Creatinine up to 5.10, lytes OK CBG elevated at 207 HCT= 34  Continue current Rx  Marsean Elkhatib C. Roxan Hockey, MD [facility] [phone]

## 2018-02-19 NOTE — Progress Notes (Signed)
[address]       ,[place] [address]             [phone]        CARDIOTHORACIC SURGERY PROGRESS NOTE   R1 Day Post-Op Procedure(s) (LRB): MITRAL VALVE (MV) REPLACEMENT (N/A) CORONARY ARTERY BYPASS GRAFTING (CABG) WITH IMA. ENDOSCOPIC VEIN HARVEST. IMA TO LAD, SVG TO CIRC. (N/A) MAZE (N/A) TRANSESOPHAGEAL ECHOCARDIOGRAM (TEE) (N/A) PLACEMENT OF TEMPORARY HD CATHETER, POWER TRIALYSIS 13FR 20CM (N/A) PATENT FORAMEN OVALE (PFO) CLOSURE (N/A)  Subjective: Awake on vent.  Looks comfortable.  Reports mild pain.  Follows simple commands.  Slow to wake overnight, and vent weaning held due to elevated pCO2  Objective: Vital signs: BP Readings from Last 1 Encounters:  02/19/18 (!) 150/89   Pulse Readings from Last 1 Encounters:  02/19/18 80   Resp Readings from Last 1 Encounters:  02/19/18 19   Temp Readings from Last 1 Encounters:  02/19/18 (!) 100.6 F (38.1 C)    Hemodynamics: PAP: (27-45)/(16-28) 30/19 CO:  [3.4 L/min-5.5 L/min] 3.4 L/min CI:  [2 L/min/m2-3.3 L/min/m2] 2 L/min/m2  Physical Exam:  Rhythm:   Complete heart block, DDD pacing  Breath sounds: clear  Heart sounds:  RRR  Incisions:  Dressings dry, intact  Abdomen:  Soft, non-distended, non-tender  Extremities:  Warm, well-perfused  Chest tubes:  low volume thin serosanguinous output, no air leak    Intake/Output from previous day: 08/22 0701 - 08/23 0700 In: 3468.8 [I.V.:1836.4; Blood:1262; NG/GT:80; IV Piggyback:290.4] Out: 3400 [Urine:2360; Blood:600; Chest Tube:440] Intake/Output this shift: No intake/output data recorded.  Lab Results:  CBC: Recent Labs    02/18/18 2104 02/19/18 0341  WBC 17.7* 16.7*  HGB 11.9* 11.8*  HCT 35.6* 35.0*  PLT 141* 119*    BMET:  Recent Labs    02/17/18 0353  02/18/18 2059 02/18/18 2104 02/19/18 0341  NA 135   < > 140  --  139  K 4.2   < > 3.8  --  4.0  CL 96*   < > 102  --  106  CO2 30  --   --   --  25  GLUCOSE 136*   <  > 115*  --  148*  BUN 55*   < > 52*  --  56*  CREATININE 5.49*   < > 4.60* 4.64* 4.40*  CALCIUM 10.1  --   --   --  9.1   < > = values in this interval not displayed.     PT/INR:   Recent Labs    02/18/18 1441  LABPROT 18.8*  INR 1.59    CBG (last 3)  Recent Labs    02/19/18 0344 02/19/18 0454 02/19/18 0603  GLUCAP 144* 127* 122*    ABG    Component Value Date/Time   PHART 7.273 (L) 02/19/2018 0344   PCO2ART 50.4 (H) 02/19/2018 0344   PO2ART 85.0 02/19/2018 0344   HCO3 23.2 02/19/2018 0344   TCO2 25 02/19/2018 0344   ACIDBASEDEF 4.0 (H) 02/19/2018 0344   O2SAT 94.0 02/19/2018 0344    CXR: PORTABLE CHEST 1 VIEW  COMPARISON:  02/19/2007  FINDINGS: Endotracheal tube, bilateral IJ lines, subclavian line, bilateral chest tubes, mediastinal drainage catheters in stable position. Prior CABG. Prior cardiac valve replacement. Left atrial appendage clip in stable position. Stable cardiomegaly. Mild bilateral mid and lower lung field subsegmental atelectasis again noted. No pleural effusion. Small left pneumothorax.  IMPRESSION: 1. Lines and tubes including bilateral  chest tubes in stable position. Small left apical pneumothorax.  2. Prior CABG. Prior cardiac valve replacement. Left atrial appendage clip in stable position. Stable cardiomegaly. No pulmonary venous congestion.  3. Stable mild bilateral mid lung and bibasilar subsegmental atelectasis.  Critical Value/emergent results were called by telephone at the time of interpretation on 02/19/2018 at 7:20 am to nurse Emer , who verbally acknowledged these results.   Electronically Signed   By: Marcello Moores  Register   On: 02/19/2018 07:22  Assessment/Plan: S/P Procedure(s) (LRB): MITRAL VALVE (MV) REPLACEMENT (N/A) CORONARY ARTERY BYPASS GRAFTING (CABG) WITH IMA. ENDOSCOPIC VEIN HARVEST. IMA TO LAD, SVG TO CIRC. (N/A) MAZE (N/A) TRANSESOPHAGEAL ECHOCARDIOGRAM (TEE) (N/A) PLACEMENT OF TEMPORARY HD  CATHETER, POWER TRIALYSIS 13FR 20CM (N/A) PATENT FORAMEN OVALE (PFO) CLOSURE (N/A)  Overall stable POD1 Maintaining DDD paced rhythm w/ stable hemodynamics on low dose dopamine for renal perfusion Complete heart block under pacer O2 sats 98-100% and CXR clear, looks ready for extubation CKD stage IV-V - currently still making reasonable UOP and potassium only 4.0   Continue DDD pacing and hold beta blockers  Proceed w/ acute vent wean, extubation  Mobilize after extubation  Continue lasix and watch renal function D/C tubes later today or tomorrow, depending on output  Start Coumadin slowly   Rexene Alberts, MD 02/19/2018 8:20 AM

## 2018-02-19 NOTE — Procedures (Signed)
Extubation Procedure Note  Patient Details:   Name: Jocelyn Hill DOB: [birth year] MRN: [id number]   Airway Documentation:   Prior to extubation, pt is unable to perform VC/NIF, Dr Ricard Dillon aware and wants to proceed w/ extubation.     Vent end date: 02/19/18 Vent end time: 0910   Evaluation  O2 sats: stable throughout Complications: No apparent complications Patient did tolerate procedure well. Bilateral Breath Sounds: Diminished, Clear   No pt does not speak, but nods head "yes/no" when asked questions.  No distress noted, no increased WOB noted currently, no stridor noted.  RN at bedside and aware.   Lenna Sciara 02/19/2018, 9:15 AM

## 2018-02-19 NOTE — Progress Notes (Signed)
Subjective:  Did well with surgery- now in ICU on dopamine with a little nitro added this AM for high BP- good UOP - crt actually down from preop  Objective Vital signs in last 24 hours: Vitals:   02/19/18 0630 02/19/18 0645 02/19/18 0700 02/19/18 0745  BP: (!) 150/91 (!) 148/86 (!) 150/89   Pulse: 80 79 79 80  Resp: 18 18 18 19   Temp: (!) 100.6 F (38.1 C) (!) 100.6 F (38.1 C) (!) 100.6 F (38.1 C)   TempSrc:      SpO2: 99% 98% 99% 100%  Weight:      Height:       Weight change: 9.1 kg  Intake/Output Summary (Last 24 hours) at 02/19/2018 0758 Last data filed at 02/19/2018 0700 Gross per 24 hour  Intake 3281.67 ml  Output 3400 ml  Net -118.33 ml    Assessment/ Plan: Pt is a 67 y.o. yo female with DM, HTN with CAD, MV dz- also with advanced CKD at baseline- crt around 4 who was admitted on 02/09/2018 with accelerated angina req escalation of surgical correction- s/p CABG/MAZE/ MVR on 8/22 Assessment/Plan: 1. Renal- advanced CKD at baseline- perm access was deferred secondary to cardiac pathology needing surgical correction.  Felt there was a good probability she would require dialytic support after surgery- vascath in place. Crt stable- No absolute indications for HD as of yet and making good urine.  2. HTN/Vol- BP is high- norvasc 10/ lasix 120 BID- pre surgery- now on drips including NTG. Weight/volume status better pre op- weight down a lot. Lasix has been continued - probably euvolemic to low- will watch  3. Anemia- not significant at this time 4. Bones- phos was OK - no binder- last PTH 235- on calcitriol- held right now periop- calcium borderline 5. CV- s/p CABG/MAZE/MVR 8/22- seems to be doing well postop       Cricket Goodlin A    Labs: Basic Metabolic Panel: Recent Labs  Lab 02/15/18 0404 02/16/18 0416 02/17/18 0353  02/18/18 1320  02/18/18 1508 02/18/18 2059 02/18/18 2104 02/19/18 0341  NA 137 134* 135   < > 135   < > 139 140  --  139  K 4.9 4.2 4.2    < > 4.2   < > 3.5 3.8  --  4.0  CL 97* 96* 96*   < > 100  --   --  102  --  106  CO2 26 28 30   --   --   --   --   --   --  25  GLUCOSE 123* 168* 136*   < > 158*   < > 142* 115*  --  148*  BUN 53* 53* 55*   < > 53*  --   --  52*  --  56*  CREATININE 4.94* 5.06* 5.49*   < > 5.00*  --   --  4.60* 4.64* 4.40*  CALCIUM 10.2 10.1 10.1  --   --   --   --   --   --  9.1  PHOS 5.0* 5.1* 5.2*  --   --   --   --   --   --   --    < > = values in this interval not displayed.   Liver Function Tests: Recent Labs  Lab 02/15/18 0404 02/16/18 0416 02/17/18 0353  AST  --   --  16  ALT  --   --  15  ALKPHOS  --   --  96  BILITOT  --   --  0.9  PROT  --   --  7.3  ALBUMIN 4.1 3.9 4.2   No results for input(s): LIPASE, AMYLASE in the last 168 hours. No results for input(s): AMMONIA in the last 168 hours. CBC: Recent Labs  Lab 02/18/18 0401  02/18/18 1441 02/18/18 1506  02/18/18 2059 02/18/18 2104 02/19/18 0341  WBC 6.3  --  6.2 12.0*  --   --  17.7* 16.7*  NEUTROABS  --   --   --   --   --   --  16.0*  --   HGB 11.7*   < > 5.1* 10.7*   < > 12.6 11.9* 11.8*  HCT 34.3*   < > 15.8* 32.4*   < > 37.0 35.6* 35.0*  MCV 79.6  --  85.9 82.2  --   --  82.4 82.5  PLT 241   < > 59* 131*  --   --  141* 119*   < > = values in this interval not displayed.   Cardiac Enzymes: Recent Labs  Lab 02/14/18 1224  TROPONINI <0.03   CBG: Recent Labs  Lab 02/19/18 0105 02/19/18 0209 02/19/18 0344 02/19/18 0454 02/19/18 0603  GLUCAP 87 117* 144* 127* 122*    Iron Studies: No results for input(s): IRON, TIBC, TRANSFERRIN, FERRITIN in the last 72 hours. Studies/Results: Dg Chest Port 1 View  Result Date: 02/19/2018 CLINICAL DATA:  Intubation. EXAM: PORTABLE CHEST 1 VIEW COMPARISON:  02/19/2007 FINDINGS: Endotracheal tube, bilateral IJ lines, subclavian line, bilateral chest tubes, mediastinal drainage catheters in stable position. Prior CABG. Prior cardiac valve replacement. Left atrial appendage clip  in stable position. Stable cardiomegaly. Mild bilateral mid and lower lung field subsegmental atelectasis again noted. No pleural effusion. Small left pneumothorax. IMPRESSION: 1. Lines and tubes including bilateral chest tubes in stable position. Small left apical pneumothorax. 2. Prior CABG. Prior cardiac valve replacement. Left atrial appendage clip in stable position. Stable cardiomegaly. No pulmonary venous congestion. 3. Stable mild bilateral mid lung and bibasilar subsegmental atelectasis. Critical Value/emergent results were called by telephone at the time of interpretation on 02/19/2018 at 7:20 am to nurse Emer , who verbally acknowledged these results. Electronically Signed   By: Marcello Moores  Register   On: 02/19/2018 07:22   Dg Chest Port 1 View  Result Date: 02/18/2018 CLINICAL DATA:  Atelectasis. EXAM: PORTABLE CHEST 1 VIEW COMPARISON:  Radiograph of February 17, 2018. FINDINGS: Endotracheal and nasogastric tubes are unchanged in position. Right internal jugular catheter is noted with tip at cavoatrial junction. Left internal jugular catheter is noted with tip in SVC. Left internal jugular catheter is noted with tip in expected position of main pulmonary artery. Status post cardiac valve repair. Status post coronary bypass graft. Bilateral chest tubes are noted without pneumothorax. No significant pleural effusion is noted. No significant pulmonary abnormality is noted. Bony thorax is unremarkable. IMPRESSION: Endotracheal and nasogastric tubes are in grossly good position. Bilateral chest tubes are noted without pneumothorax. Bilateral internal jugular catheters are noted as well in grossly good position. Electronically Signed   By: Marijo Conception, M.D.   On: 02/18/2018 14:57   Medications: Infusions: . sodium chloride 20 mL/hr at 02/19/18 0300  . sodium chloride    . sodium chloride 10 mL/hr at 02/18/18 1515  . albumin human    . cefUROXime (ZINACEF)  IV    . dexmedetomidine (PRECEDEX) IV  infusion Stopped (02/18/18 1646)  . DOPamine 3 mcg/kg/min (02/19/18  0300)  . furosemide 120 mg (02/19/18 0627)  . insulin (NOVOLIN-R) infusion 0.6 mL/hr at 02/19/18 0300  . lactated ringers    . lactated ringers    . lactated ringers 10 mL/hr at 02/19/18 0300  . nitroGLYCERIN 25 mcg/min (02/19/18 0300)  . phenylephrine (NEO-SYNEPHRINE) Adult infusion Stopped (02/18/18 1522)    Scheduled Medications: . acetaminophen  1,000 mg Oral Q6H   Or  . acetaminophen (TYLENOL) oral liquid 160 mg/5 mL  1,000 mg Per Tube Q6H  . aspirin EC  325 mg Oral Daily   Or  . aspirin  324 mg Per Tube Daily  . bisacodyl  10 mg Oral Daily   Or  . bisacodyl  10 mg Rectal Daily  . chlorhexidine gluconate (MEDLINE KIT)  15 mL Mouth Rinse BID  . Chlorhexidine Gluconate Cloth  6 each Topical Daily  . docusate sodium  200 mg Oral Daily  . insulin regular  0-10 Units Intravenous TID WC  . levothyroxine  112 mcg Oral QAC breakfast  . mouth rinse  15 mL Mouth Rinse 10 times per day  . [START ON 02/20/2018] pantoprazole  40 mg Oral Daily  . sodium chloride flush  3 mL Intravenous Q12H    have reviewed scheduled and prn medications.  Physical Exam: General: intubated- but alert Heart: RRR Lungs:  dec BS at bases Abdomen: soft, non tender Extremities:  Really no edema    02/19/2018,7:58 AM  LOS: 10 days

## 2018-02-19 NOTE — Progress Notes (Signed)
Per RN, ABG is acidotic.  Pt place back on full vent support. Will retry rapid wean protocol later.

## 2018-02-19 NOTE — Progress Notes (Signed)
After a few hours on set vent rate of 18rpm, pt's ABG is now within parameters. Pt placed on 40/4 by respiratory at 02:44 per rapid wean protocol.

## 2018-02-19 NOTE — Progress Notes (Signed)
ABG results are acidotic, pt placed back on full vent support.

## 2018-02-20 ENCOUNTER — Inpatient Hospital Stay (HOSPITAL_COMMUNITY): Payer: Medicare Other

## 2018-02-20 DIAGNOSIS — Z9889 Other specified postprocedural states: Secondary | ICD-10-CM

## 2018-02-20 DIAGNOSIS — Z951 Presence of aortocoronary bypass graft: Secondary | ICD-10-CM

## 2018-02-20 DIAGNOSIS — I480 Paroxysmal atrial fibrillation: Secondary | ICD-10-CM

## 2018-02-20 DIAGNOSIS — I051 Rheumatic mitral insufficiency: Secondary | ICD-10-CM

## 2018-02-20 DIAGNOSIS — I442 Atrioventricular block, complete: Secondary | ICD-10-CM

## 2018-02-20 DIAGNOSIS — I10 Essential (primary) hypertension: Secondary | ICD-10-CM

## 2018-02-20 DIAGNOSIS — Z8679 Personal history of other diseases of the circulatory system: Secondary | ICD-10-CM

## 2018-02-20 DIAGNOSIS — Z953 Presence of xenogenic heart valve: Secondary | ICD-10-CM

## 2018-02-20 DIAGNOSIS — D631 Anemia in chronic kidney disease: Secondary | ICD-10-CM

## 2018-02-20 LAB — CBC
HCT: 30.9 % — ABNORMAL LOW (ref 36.0–46.0)
Hemoglobin: 10.2 g/dL — ABNORMAL LOW (ref 12.0–15.0)
MCH: 27.4 pg (ref 26.0–34.0)
MCHC: 33 g/dL (ref 30.0–36.0)
MCV: 83.1 fL (ref 78.0–100.0)
Platelets: 79 10*3/uL — ABNORMAL LOW (ref 150–400)
RBC: 3.72 MIL/uL — ABNORMAL LOW (ref 3.87–5.11)
RDW: 15.3 % (ref 11.5–15.5)
WBC: 18.8 10*3/uL — ABNORMAL HIGH (ref 4.0–10.5)

## 2018-02-20 LAB — BASIC METABOLIC PANEL
Anion gap: 14 (ref 5–15)
BUN: 67 mg/dL — ABNORMAL HIGH (ref 8–23)
CO2: 21 mmol/L — ABNORMAL LOW (ref 22–32)
Calcium: 9.1 mg/dL (ref 8.9–10.3)
Chloride: 102 mmol/L (ref 98–111)
Creatinine, Ser: 5.09 mg/dL — ABNORMAL HIGH (ref 0.44–1.00)
GFR calc Af Amer: 9 mL/min — ABNORMAL LOW (ref >60)
GFR calc non Af Amer: 8 mL/min — ABNORMAL LOW (ref >60)
Glucose, Bld: 187 mg/dL — ABNORMAL HIGH (ref 70–99)
Potassium: 4.2 mmol/L (ref 3.5–5.1)
Sodium: 137 mmol/L (ref 135–145)

## 2018-02-20 LAB — GLUCOSE, CAPILLARY: Glucose-Capillary: 168 mg/dL — ABNORMAL HIGH (ref 70–99)

## 2018-02-20 LAB — PROTIME-INR
INR: 1.23
Prothrombin Time: 15.4 seconds — ABNORMAL HIGH (ref 11.4–15.2)

## 2018-02-20 MED ORDER — HYDRALAZINE HCL 25 MG PO TABS
25.0000 mg | ORAL_TABLET | Freq: Three times a day (TID) | ORAL | Status: DC
  Administered 2018-02-20 – 2018-02-21 (×4): 25 mg via ORAL
  Filled 2018-02-20 (×3): qty 1

## 2018-02-20 MED ORDER — AMLODIPINE BESYLATE 10 MG PO TABS
10.0000 mg | ORAL_TABLET | Freq: Every day | ORAL | Status: DC
  Administered 2018-02-20 – 2018-03-05 (×14): 10 mg via ORAL
  Filled 2018-02-20 (×14): qty 1

## 2018-02-20 NOTE — Plan of Care (Signed)
Pt currently in the chair. Vital signs stable. Pt had a BM this shift. Nitro currently going at 75 mcg/min. Pacer turned up to 80 DDD per MD [facility] Pt still lethargic and having delayed responses, but in a way, more alert than this AM. PRN meds given as ordered. Meds given with apple sauce to help reduce aspiration risk, tolerated well. Mediastinal CT discontinued, as well as introducer, per order. Foley still in place. Pt still on a liquid diet. No new changes or complaints at this time. Will continue to monitor.  Problem: Education: Goal: Knowledge of General Education information will improve Description Including pain rating scale, medication(s)/side effects and non-pharmacologic comfort measures 02/20/2018 1756 by Gennie Alma, RN Outcome: Progressing 02/20/2018 1756 by Gennie Alma, RN Outcome: Progressing   Problem: Health Behavior/Discharge Planning: Goal: Ability to manage health-related needs will improve 02/20/2018 1756 by Gennie Alma, RN Outcome: Progressing 02/20/2018 1756 by Gennie Alma, RN Outcome: Progressing   Problem: Clinical Measurements: Goal: Ability to maintain clinical measurements within normal limits will improve 02/20/2018 1756 by Gennie Alma, RN Outcome: Progressing 02/20/2018 1756 by Gennie Alma, RN Outcome: Progressing Goal: Will remain free from infection 02/20/2018 1756 by Gennie Alma, RN Outcome: Progressing 02/20/2018 1756 by Gennie Alma, RN Outcome: Progressing Goal: Diagnostic test results will improve 02/20/2018 1756 by Gennie Alma, RN Outcome: Progressing 02/20/2018 1756 by Gennie Alma, RN Outcome: Progressing Goal: Respiratory complications will improve 02/20/2018 1756 by Gennie Alma, RN Outcome: Progressing 02/20/2018 1756 by Gennie Alma, RN Outcome: Progressing Goal: Cardiovascular complication will be avoided 02/20/2018 1756 by Gennie Alma, RN Outcome:  Progressing 02/20/2018 1756 by Gennie Alma, RN Outcome: Progressing   Problem: Activity: Goal: Risk for activity intolerance will decrease 02/20/2018 1756 by Gennie Alma, RN Outcome: Progressing 02/20/2018 1756 by Gennie Alma, RN Outcome: Progressing   Problem: Nutrition: Goal: Adequate nutrition will be maintained 02/20/2018 1756 by Gennie Alma, RN Outcome: Progressing 02/20/2018 1756 by Gennie Alma, RN Outcome: Progressing   Problem: Coping: Goal: Level of anxiety will decrease 02/20/2018 1756 by Gennie Alma, RN Outcome: Progressing 02/20/2018 1756 by Gennie Alma, RN Outcome: Progressing   Problem: Elimination: Goal: Will not experience complications related to bowel motility 02/20/2018 1756 by Gennie Alma, RN Outcome: Progressing 02/20/2018 1756 by Gennie Alma, RN Outcome: Progressing Goal: Will not experience complications related to urinary retention 02/20/2018 1756 by Gennie Alma, RN Outcome: Progressing 02/20/2018 1756 by Gennie Alma, RN Outcome: Progressing   Problem: Safety: Goal: Ability to remain free from injury will improve 02/20/2018 1756 by Gennie Alma, RN Outcome: Progressing 02/20/2018 1756 by Gennie Alma, RN Outcome: Progressing   Problem: Skin Integrity: Goal: Risk for impaired skin integrity will decrease 02/20/2018 1756 by Gennie Alma, RN Outcome: Progressing 02/20/2018 1756 by Gennie Alma, RN Outcome: Progressing   Problem: Education: Goal: Will demonstrate proper wound care and an understanding of methods to prevent future damage 02/20/2018 1756 by Gennie Alma, RN Outcome: Progressing 02/20/2018 1756 by Gennie Alma, RN Outcome: Progressing Goal: Knowledge of disease or condition will improve 02/20/2018 1756 by Gennie Alma, RN Outcome: Progressing 02/20/2018 1756 by Gennie Alma, RN Outcome: Progressing Goal: Knowledge of  the prescribed therapeutic regimen will improve 02/20/2018 1756 by Gennie Alma, RN Outcome: Progressing 02/20/2018 1756 by Gennie Alma, RN Outcome: Progressing Goal: Individualized Educational Video(s) 02/20/2018 1756 by Gennie Alma, RN Outcome: Progressing 02/20/2018 1756 by Gennie Alma, RN Outcome: Progressing   Problem: Activity: Goal: Risk for activity intolerance will decrease 02/20/2018 1756 by Gennie Alma, RN Outcome: Progressing 02/20/2018 1756 by Sandre Kitty,  Shanera Meske, RN Outcome: Progressing   Problem: Cardiac: Goal: Will achieve and/or maintain hemodynamic stability 02/20/2018 1756 by Gennie Alma, RN Outcome: Progressing 02/20/2018 1756 by Gennie Alma, RN Outcome: Progressing   Problem: Clinical Measurements: Goal: Postoperative complications will be avoided or minimized 02/20/2018 1756 by Gennie Alma, RN Outcome: Progressing 02/20/2018 1756 by Gennie Alma, RN Outcome: Progressing   Problem: Respiratory: Goal: Respiratory status will improve 02/20/2018 1756 by Gennie Alma, RN Outcome: Progressing 02/20/2018 1756 by Gennie Alma, RN Outcome: Progressing  Pt currently in chair. Vital signs stable. PRN meds given as ordered. Pt had a BM this shift. Nitro gtt currently going at 20 mcg/hr   Problem: Skin Integrity: Goal: Wound healing without signs and symptoms of infection 02/20/2018 1756 by Gennie Alma, RN Outcome: Progressing 02/20/2018 1756 by Gennie Alma, RN Outcome: Progressing Goal: Risk for impaired skin integrity will decrease 02/20/2018 1756 by Gennie Alma, RN Outcome: Progressing 02/20/2018 1756 by Gennie Alma, RN Outcome: Progressing   Problem: Urinary Elimination: Goal: Ability to achieve and maintain adequate renal perfusion and functioning will improve 02/20/2018 1756 by Gennie Alma, RN Outcome: Progressing 02/20/2018 1756 by Gennie Alma,  RN Outcome: Progressing

## 2018-02-20 NOTE — Progress Notes (Signed)
Twelve-lead electrocardiogram shows sinus rhythm with very long PR interval, occasional second-degree AV block Mobitz type I and pre-existing left bundle branch block.

## 2018-02-20 NOTE — Progress Notes (Addendum)
2 Days Post-Op Procedure(s) (LRB): MITRAL VALVE (MV) REPLACEMENT (N/A) CORONARY ARTERY BYPASS GRAFTING (CABG) WITH IMA. ENDOSCOPIC VEIN HARVEST. IMA TO LAD, SVG TO CIRC. (N/A) MAZE (N/A) TRANSESOPHAGEAL ECHOCARDIOGRAM (TEE) (N/A) PLACEMENT OF TEMPORARY HD CATHETER, POWER TRIALYSIS 13FR 20CM (N/A) PATENT FORAMEN OVALE (PFO) CLOSURE (N/A) Subjective: C/o incisional pain  Objective: Vital signs in last 24 hours: Temp:  [97.9 F (36.6 C)-100 F (37.8 C)] 98.7 F (37.1 C) (08/24 0800) Pulse Rate:  [66-85] 81 (08/24 0730) Cardiac Rhythm: Normal sinus rhythm;Atrial paced;Bundle branch block;Heart block (08/24 0400) Resp:  [10-31] 19 (08/24 0730) BP: (135-180)/(69-102) 149/89 (08/24 0730) SpO2:  [88 %-100 %] 96 % (08/24 0730) Weight:  [61.7 kg] 61.7 kg (08/24 0428)  Hemodynamic parameters for last 24 hours: PAP: (29-53)/(15-36) 33/19 CVP:  [6 mmHg-18 mmHg] 6 mmHg CO:  [4 L/min] 4 L/min CI:  [2.3 L/min/m2] 2.3 L/min/m2  Intake/Output from previous day: 08/23 0701 - 08/24 0700 In: 1000.4 [P.O.:60; I.V.:721.9; IV Piggyback:218.5] Out: 1530 [QMGQQ:7619; Chest Tube:340] Intake/Output this shift: Total I/O In: -  Out: 50 [Urine:50]  General appearance: alert, cooperative and mild distress Neurologic: intact Heart: regular rate and rhythm Lungs: diminished breath sounds bibasilar Abdomen: normal findings: soft, non-tender and abnormal findings:  hypoactive bowel sounds  Lab Results: Recent Labs    02/19/18 1701 02/19/18 1702 02/20/18 0339  WBC 18.1*  --  18.8*  HGB 11.3* 11.6* 10.2*  HCT 33.8* 34.0* 30.9*  PLT 93*  --  79*   BMET:  Recent Labs    02/19/18 0341  02/19/18 1702 02/20/18 0339  NA 139  --  139 137  K 4.0  --  3.9 4.2  CL 106  --  102 102  CO2 25  --   --  21*  GLUCOSE 148*  --  207* 187*  BUN 56*  --  53* 67*  CREATININE 4.40*   < > 5.10* 5.09*  CALCIUM 9.1  --   --  9.1   < > = values in this interval not displayed.    PT/INR:  Recent Labs   02/20/18 0339  LABPROT 15.4*  INR 1.23   ABG    Component Value Date/Time   PHART 7.297 (L) 02/19/2018 1053   HCO3 21.3 02/19/2018 1053   TCO2 23 02/19/2018 1702   ACIDBASEDEF 5.0 (H) 02/19/2018 1053   O2SAT 97.0 02/19/2018 1053   CBG (last 3)  Recent Labs    02/19/18 0707 02/19/18 0808 02/20/18 0116  GLUCAP 158* 158* 168*    Assessment/Plan: S/P Procedure(s) (LRB): MITRAL VALVE (MV) REPLACEMENT (N/A) CORONARY ARTERY BYPASS GRAFTING (CABG) WITH IMA. ENDOSCOPIC VEIN HARVEST. IMA TO LAD, SVG TO CIRC. (N/A) MAZE (N/A) TRANSESOPHAGEAL ECHOCARDIOGRAM (TEE) (N/A) PLACEMENT OF TEMPORARY HD CATHETER, POWER TRIALYSIS 13FR 20CM (N/A) PATENT FORAMEN OVALE (PFO) CLOSURE (N/A) -CV- paced rhythm, BP elevated on relatively high dose of NTG- restart amlodipine  Rhythm is probably an accelerated junctional- continue to hold beta blocker  RESP- continue IS, has small left pneumothorax  RENAL- creatinine stable from last night at 5.1, UO 1200 ml last 24 hours  BUN up, K OK, metabolic acidosis with Co2 21  ENDO- CBG mildly elevated- continue SSI  Thrombocytopenia- PLT down from 93 to 79K- will stop lovenox for now  GI- poor appetite, advance diet as tolerated  Dc mediastinal CT   mobilize     LOS: 11 days    Jocelyn Hill 02/20/2018

## 2018-02-20 NOTE — Progress Notes (Signed)
Subjective:  No major issues overnight- has been lethargic- 1100 UOP - maybe trending less ??  Objective Vital signs in last 24 hours: Vitals:   02/20/18 0600 02/20/18 0630 02/20/18 0653 02/20/18 0700  BP: (!) 146/93 (!) 150/92    Pulse: 82 83 82 85  Resp: (!) 21 (!) 27 (!) 25 (!) 21  Temp:      TempSrc:      SpO2: 93% 93% (!) 88% 93%  Weight:      Height:       Weight change: -3.9 kg  Intake/Output Summary (Last 24 hours) at 02/20/2018 0719 Last data filed at 02/20/2018 0700 Gross per 24 hour  Intake 1000.44 ml  Output 1530 ml  Net -529.56 ml    Assessment/ Plan: Pt is a 67 y.o. yo female with DM, HTN with CAD, MV dz- also with advanced CKD at baseline- crt around 4 who was admitted on 02/09/2018 with accelerated angina req escalation of surgical correction- s/p CABG/MAZE/ MVR on 8/22 Assessment/Plan: 1. Renal- advanced CKD at baseline- perm access was deferred secondary to cardiac pathology needing surgical correction.  Felt there was a good probability she would require dialytic support after surgery- vascath in place since 8/22. Crt stable- No absolute indications for HD as of yet and making reasonable urine. However, If pt doesn't perk up- it could be considered that she is uremic and that is reason for her decreased MS in which case dialysis could be tried to improve her situation 2. HTN/Vol- BP is high- norvasc 10/ lasix 120 BID- pre surgery- now on drips including NTG. Weight/volume status better pre op- weight down a lot. Lasix has been continued - probably euvolemic- CVP 6- will continue to watch on lasix- may want to resume her norvasc >  Per cards  3. Anemia- not significant at this time 4. Bones- phos was OK - no binder- last PTH 235- on calcitriol- held right now periop- calcium borderline 5. CV- s/p CABG/MAZE/MVR 8/22- seems to be doing well postop   6. Lethargy-  Right now blaming on post op.  But, uremia is consideration- if she does not improve may be our indication  for dialysis      Colin Ellers A    Labs: Basic Metabolic Panel: Recent Labs  Lab 02/15/18 0404 02/16/18 0416 02/17/18 0353  02/19/18 0341 02/19/18 1701 02/19/18 1702 02/20/18 0339  NA 137 134* 135   < > 139  --  139 137  K 4.9 4.2 4.2   < > 4.0  --  3.9 4.2  CL 97* 96* 96*   < > 106  --  102 102  CO2 26 28 30   --  25  --   --  21*  GLUCOSE 123* 168* 136*   < > 148*  --  207* 187*  BUN 53* 53* 55*   < > 56*  --  53* 67*  CREATININE 4.94* 5.06* 5.49*   < > 4.40* 4.76* 5.10* 5.09*  CALCIUM 10.2 10.1 10.1  --  9.1  --   --  9.1  PHOS 5.0* 5.1* 5.2*  --   --   --   --   --    < > = values in this interval not displayed.   Liver Function Tests: Recent Labs  Lab 02/15/18 0404 02/16/18 0416 02/17/18 0353  AST  --   --  16  ALT  --   --  15  ALKPHOS  --   --  96  BILITOT  --   --  0.9  PROT  --   --  7.3  ALBUMIN 4.1 3.9 4.2   No results for input(s): LIPASE, AMYLASE in the last 168 hours. No results for input(s): AMMONIA in the last 168 hours. CBC: Recent Labs  Lab 02/18/18 1506  02/18/18 2104 02/19/18 0341 02/19/18 1701 02/19/18 1702 02/20/18 0339  WBC 12.0*  --  17.7* 16.7* 18.1*  --  18.8*  NEUTROABS  --   --  16.0*  --   --   --   --   HGB 10.7*   < > 11.9* 11.8* 11.3* 11.6* 10.2*  HCT 32.4*   < > 35.6* 35.0* 33.8* 34.0* 30.9*  MCV 82.2  --  82.4 82.5 83.0  --  83.1  PLT 131*  --  141* 119* 93*  --  79*   < > = values in this interval not displayed.   Cardiac Enzymes: Recent Labs  Lab 02/14/18 1224  TROPONINI <0.03   CBG: Recent Labs  Lab 02/19/18 0454 02/19/18 0603 02/19/18 0707 02/19/18 0808 02/20/18 0116  GLUCAP 127* 122* 158* 158* 168*    Iron Studies: No results for input(s): IRON, TIBC, TRANSFERRIN, FERRITIN in the last 72 hours. Studies/Results: Dg Chest Port 1 View  Result Date: 02/19/2018 CLINICAL DATA:  Intubation. EXAM: PORTABLE CHEST 1 VIEW COMPARISON:  02/19/2007 FINDINGS: Endotracheal tube, bilateral IJ lines,  subclavian line, bilateral chest tubes, mediastinal drainage catheters in stable position. Prior CABG. Prior cardiac valve replacement. Left atrial appendage clip in stable position. Stable cardiomegaly. Mild bilateral mid and lower lung field subsegmental atelectasis again noted. No pleural effusion. Small left pneumothorax. IMPRESSION: 1. Lines and tubes including bilateral chest tubes in stable position. Small left apical pneumothorax. 2. Prior CABG. Prior cardiac valve replacement. Left atrial appendage clip in stable position. Stable cardiomegaly. No pulmonary venous congestion. 3. Stable mild bilateral mid lung and bibasilar subsegmental atelectasis. Critical Value/emergent results were called by telephone at the time of interpretation on 02/19/2018 at 7:20 am to nurse Emer , who verbally acknowledged these results. Electronically Signed   By: Marcello Moores  Register   On: 02/19/2018 07:22   Dg Chest Port 1 View  Result Date: 02/18/2018 CLINICAL DATA:  Atelectasis. EXAM: PORTABLE CHEST 1 VIEW COMPARISON:  Radiograph of February 17, 2018. FINDINGS: Endotracheal and nasogastric tubes are unchanged in position. Right internal jugular catheter is noted with tip at cavoatrial junction. Left internal jugular catheter is noted with tip in SVC. Left internal jugular catheter is noted with tip in expected position of main pulmonary artery. Status post cardiac valve repair. Status post coronary bypass graft. Bilateral chest tubes are noted without pneumothorax. No significant pleural effusion is noted. No significant pulmonary abnormality is noted. Bony thorax is unremarkable. IMPRESSION: Endotracheal and nasogastric tubes are in grossly good position. Bilateral chest tubes are noted without pneumothorax. Bilateral internal jugular catheters are noted as well in grossly good position. Electronically Signed   By: Marijo Conception, M.D.   On: 02/18/2018 14:57   Medications: Infusions: . cefUROXime (ZINACEF)  IV Stopped  (02/19/18 0843)  . furosemide 62 mL/hr at 02/20/18 0700  . lactated ringers Stopped (02/20/18 7989)  . nitroGLYCERIN 80 mcg/min (02/20/18 0700)    Scheduled Medications: . acetaminophen  1,000 mg Oral Q6H  . aspirin EC  325 mg Oral Daily  . atorvastatin  80 mg Oral Daily  . bisacodyl  10 mg Oral Daily   Or  . bisacodyl  10 mg  Rectal Daily  . buPROPion  100 mg Oral TID AC  . Chlorhexidine Gluconate Cloth  6 each Topical Daily  . docusate sodium  200 mg Oral Daily  . enoxaparin (LOVENOX) injection  30 mg Subcutaneous QHS  . levothyroxine  112 mcg Oral QAC breakfast  . mouth rinse  15 mL Mouth Rinse BID  . montelukast  10 mg Oral Daily  . pantoprazole  40 mg Oral Daily    have reviewed scheduled and prn medications.  Physical Exam: General: arousable "not good"  Falls asleep easily  Heart: RRR Lungs:  dec BS at bases Abdomen: soft, non tender Extremities:  Really no edema    02/20/2018,7:19 AM  LOS: 11 days

## 2018-02-20 NOTE — Progress Notes (Signed)
Progress Note  Patient Name: Jocelyn Hill Date of Encounter: 02/20/2018  Primary Cardiologist: No primary care provider on file.   Subjective   Extubated. Sleepy, but easy to awake and appears oriented. No longer pacing - native wide complex rhythm in 70s, cannot tell from monitor if it is sinus rhythm with very long PR or accelerated junctional rhythm. Preexisting LBBB. BP high Good UO, net negative 578mL. Weight decreasing rapidly, still about 4-5 kg above preop weight. Creat stable at 5, Hgb 10.2, plt 79k  02/18/2018  Mitral Valve Replacement              Edwards Magna Mitral Bovine Bioprosthetic Tissue Valve (size 29 mm, model #7300TFX, serial #[id number])   Maze Procedure              complete bilateral atrial lesion set using bipolar radiofrequency and cryothermy ablation             clipping of left atrial appendage (Atricure Pro245 left atrial clip size 45 mm)   Coronary Artery Bypass Grafting x 2              Left Internal Mammary Artery to Distal Left Anterior Descending Coronary Artery             Saphenous Vein Graft to Distal Left Circumflex Coronary Artery             Endoscopic Vein Harvest from Right Thigh    Closure of Patent Foramen Ovale  Inpatient Medications    Scheduled Meds: . acetaminophen  1,000 mg Oral Q6H  . amLODipine  10 mg Oral Daily  . aspirin EC  325 mg Oral Daily  . atorvastatin  80 mg Oral Daily  . bisacodyl  10 mg Oral Daily   Or  . bisacodyl  10 mg Rectal Daily  . buPROPion  100 mg Oral TID AC  . Chlorhexidine Gluconate Cloth  6 each Topical Daily  . docusate sodium  200 mg Oral Daily  . enoxaparin (LOVENOX) injection  30 mg Subcutaneous QHS  . levothyroxine  112 mcg Oral QAC breakfast  . mouth rinse  15 mL Mouth Rinse BID  . montelukast  10 mg Oral Daily  . pantoprazole  40 mg Oral Daily   Continuous Infusions: . furosemide Stopped (02/20/18 0733)  . lactated ringers 10 mL/hr at 02/20/18 0800  . nitroGLYCERIN 80  mcg/min (02/20/18 0800)   PRN Meds: ALPRAZolam, calcium carbonate, metoprolol tartrate, morphine injection, ondansetron (ZOFRAN) IV, oxyCODONE, sodium chloride flush, sodium chloride flush, traMADol   Vital Signs    Vitals:   02/20/18 0653 02/20/18 0700 02/20/18 0730 02/20/18 0800  BP:   (!) 149/89   Pulse: 82 85 81   Resp: (!) 25 (!) 21 19   Temp:    98.7 F (37.1 C)  TempSrc:    Oral  SpO2: (!) 88% 93% 96%   Weight:      Height:        Intake/Output Summary (Last 24 hours) at 02/20/2018 0849 Last data filed at 02/20/2018 0816 Gross per 24 hour  Intake 1031.25 ml  Output 1505 ml  Net -473.75 ml   Filed Weights   02/18/18 0443 02/19/18 0500 02/20/18 0428  Weight: 56.5 kg 65.6 kg 61.7 kg    Telemetry    SR with long PR  versus junctional rhythm, wide QRS - Personally Reviewed  ECG    pending - Personally Reviewed  Physical Exam  Sleepy, thin GEN: No acute distress.  Neck: No JVD Cardiac: RRR, no murmurs, rubs, or gallops.  Respiratory: Clear to auscultation bilaterally, poor ventilatory efforts, quiet in both bases GI: Soft, nontender, non-distended  MS: No edema; No deformity. Neuro:  Nonfocal  Psych: Normal affect   Labs    Chemistry Recent Labs  Lab 02/15/18 0404 02/16/18 0416 02/17/18 0353  02/19/18 0341 02/19/18 1701 02/19/18 1702 02/20/18 0339  NA 137 134* 135   < > 139  --  139 137  K 4.9 4.2 4.2   < > 4.0  --  3.9 4.2  CL 97* 96* 96*   < > 106  --  102 102  CO2 26 28 30   --  25  --   --  21*  GLUCOSE 123* 168* 136*   < > 148*  --  207* 187*  BUN 53* 53* 55*   < > 56*  --  53* 67*  CREATININE 4.94* 5.06* 5.49*   < > 4.40* 4.76* 5.10* 5.09*  CALCIUM 10.2 10.1 10.1  --  9.1  --   --  9.1  PROT  --   --  7.3  --   --   --   --   --   ALBUMIN 4.1 3.9 4.2  --   --   --   --   --   AST  --   --  16  --   --   --   --   --   ALT  --   --  15  --   --   --   --   --   ALKPHOS  --   --  96  --   --   --   --   --   BILITOT  --   --  0.9  --   --    --   --   --   GFRNONAA 8* 8* 7*   < > 10* 9*  --  8*  GFRAA 10* 9* 8*   < > 11* 10*  --  9*  ANIONGAP 14 10 9   --  8  --   --  14   < > = values in this interval not displayed.     Hematology Recent Labs  Lab 02/19/18 0341 02/19/18 1701 02/19/18 1702 02/20/18 0339  WBC 16.7* 18.1*  --  18.8*  RBC 4.24 4.07  --  3.72*  HGB 11.8* 11.3* 11.6* 10.2*  HCT 35.0* 33.8* 34.0* 30.9*  MCV 82.5 83.0  --  83.1  MCH 27.8 27.8  --  27.4  MCHC 33.7 33.4  --  33.0  RDW 15.2 15.3  --  15.3  PLT 119* 93*  --  79*    Cardiac Enzymes Recent Labs  Lab 02/14/18 1224  TROPONINI <0.03   No results for input(s): TROPIPOC in the last 168 hours.   BNP Recent Labs  Lab 02/17/18 0353  BNP 675.7*     DDimer No results for input(s): DDIMER in the last 168 hours.   Radiology    Dg Chest Port 1 View  Result Date: 02/20/2018 CLINICAL DATA:  Follow-up congestive heart failure.  Extubated. EXAM: PORTABLE CHEST 1 VIEW COMPARISON:  02/19/2018 FINDINGS: Endotracheal tube and nasogastric tube have been removed. Swan-Ganz catheter is been removed. Bilateral chest tubes remain in place. Right internal jugular central line remains in place with its tip at the SVC RA junction. Mediastinal drains remain in place. Left internal jugular central line  tip at the innominate SVC junction. Left internal jugular venous access sheath remains in place. No pneumothorax seen on the right. Small amount of apical pleural air seen on the left. Mild bibasilar atelectasis persists. IMPRESSION: Endotracheal tube, nasogastric tube and Swan-Ganz catheter removed. Persistent left apical pneumothorax, 5% or less. Persistent basilar atelectasis. Electronically Signed   By: Nelson Chimes M.D.   On: 02/20/2018 08:03   Dg Chest Port 1 View  Result Date: 02/19/2018 CLINICAL DATA:  Intubation. EXAM: PORTABLE CHEST 1 VIEW COMPARISON:  02/19/2007 FINDINGS: Endotracheal tube, bilateral IJ lines, subclavian line, bilateral chest tubes,  mediastinal drainage catheters in stable position. Prior CABG. Prior cardiac valve replacement. Left atrial appendage clip in stable position. Stable cardiomegaly. Mild bilateral mid and lower lung field subsegmental atelectasis again noted. No pleural effusion. Small left pneumothorax. IMPRESSION: 1. Lines and tubes including bilateral chest tubes in stable position. Small left apical pneumothorax. 2. Prior CABG. Prior cardiac valve replacement. Left atrial appendage clip in stable position. Stable cardiomegaly. No pulmonary venous congestion. 3. Stable mild bilateral mid lung and bibasilar subsegmental atelectasis. Critical Value/emergent results were called by telephone at the time of interpretation on 02/19/2018 at 7:20 am to nurse Emer , who verbally acknowledged these results. Electronically Signed   By: Marcello Moores  Register   On: 02/19/2018 07:22   Dg Chest Port 1 View  Result Date: 02/18/2018 CLINICAL DATA:  Atelectasis. EXAM: PORTABLE CHEST 1 VIEW COMPARISON:  Radiograph of February 17, 2018. FINDINGS: Endotracheal and nasogastric tubes are unchanged in position. Right internal jugular catheter is noted with tip at cavoatrial junction. Left internal jugular catheter is noted with tip in SVC. Left internal jugular catheter is noted with tip in expected position of main pulmonary artery. Status post cardiac valve repair. Status post coronary bypass graft. Bilateral chest tubes are noted without pneumothorax. No significant pleural effusion is noted. No significant pulmonary abnormality is noted. Bony thorax is unremarkable. IMPRESSION: Endotracheal and nasogastric tubes are in grossly good position. Bilateral chest tubes are noted without pneumothorax. Bilateral internal jugular catheters are noted as well in grossly good position. Electronically Signed   By: Marijo Conception, M.D.   On: 02/18/2018 14:57    Cardiac Studies   Cath 07/03/2017 1. Low filling pressure and preserved cardiac output.  2. 90%  ostial LCx stenosis.  3. Napkin ring-like stenosis in the proximal LAD, at least 60% stenosis.   ECHO 02/10/2018 - Left ventricle: The cavity size was mildly dilated. There was   moderate concentric hypertrophy. Systolic function was normal.   The estimated ejection fraction was in the range of 55% to 60%.   Wall motion was normal; there were no regional wall motion   abnormalities. Features are consistent with a pseudonormal left   ventricular filling pattern, with concomitant abnormal relaxation   and increased filling pressure (grade 2 diastolic dysfunction).   Doppler parameters are consistent with high ventricular filling   pressure. - Mitral valve: Mild thickening and calcification of the anterior   leaflet, consistent with rheumatic disease. Mobility was   restricted. Diastolic leaflet doming was present. The findings   are consistent with trivial stenosis with mean MV gradient of   60mmHg. There was moderate regurgitation. Effective regurgitant   orifice (PISA): 0.17 cm^2. Regurgitant volume (PISA): 24 ml. - Left atrium: The atrium was moderately dilated. - Tricuspid valve: There was mild regurgitation. - Pulmonary arteries: PA peak pressure: 39 mm Hg (S). - Pericardium, extracardiac: A trivial pericardial effusion was  identified posterior to the heart.  Impressions:  - Mildly dilated LV with moderate LVF and normal LVF with EF   55-60%. There is moderate LAE, mild TR and mild pulmonary HTN   with PASP 71mmHg. The MV appears rheumatic with thickening and   diastolic doming. There appears to be at least moderate MR with   ERO of 0.17cm2 and regurgitant volume of 24cc. The right   ventricular systolic pressure was increased consistent with mild   pulmonary hypertension.   TEE intraop 02/18/2018  Right ventricle: Right ventricular cavity was normal in size. There was normal RV systolic function.  Tricuspid valve: The tricuspid valve appeared structurally normal. There  was trace tricuspid insufficiency.  Pulmonic valve: The pulmonic valve appeared structurally normal. There was trace pulmonic insufficiency.  Mitral valve: The mitral leaflets were thickened but appeared to open without restriction. There was severe mitral regurgitation with a central jet. The ERO by the PISA method was 0.5 cm. The regurgitant volume was 70 cc. There were no flail or prolapsing leaflet segments. On the post bypass exam, there was a bioprosthetic valve in the mitral position. The leaflets opened normally. There was a central jet of mitral insufficiency that was mild. After careful interrogation of the sewing  Right atrium: Cavity is mildly dilated.  Patient Profile     67 y.o. female with severe 2 vessel CAD, severe MR, preserved LVEF, paroxysmal atrial fibrillation, CKD stage 4, severe HTN now s/p CABG/MVR/MAZE on 08/22, had postop complete heart block  Assessment & Plan    1. CAD s/p CABG: has surgical pain, controlled 2. MR s/p MVR:  Nor rumble or murmur is heard on exam 3. CHB:  Current rhythm may be sinus with long PR. Checking 12 lead ECG. TPM backup at 50. 4. AFib s/p MAZE: no afib seen on monitor 5. HTN: amlodipine restarted, will also need hydralazine. She is on IV NTG. Cannot use beta blockers or RAAS inhibitors. 6. Acute on chronic stage 4 renal insufficiency:  Diuretics per Dr. Moshe Cipro. She is still about 4-5 kg above preop weight. SG catheter has been removed, last CVP 10 and last PA diastolic 18. 7. L apical pneumothorax: small, stable.   For questions or updates, please contact [place] Please consult [email or web address] for contact info under Cardiology/STEMI.      Signed, Sanda Klein, MD  02/20/2018, 8:49 AM

## 2018-02-20 NOTE — Addendum Note (Signed)
Addendum  created 02/20/18 1111 by Roberts Gaudy, MD   Sign clinical note

## 2018-02-20 NOTE — Progress Notes (Signed)
      [id number]       [place] [address]             [phone]      Still having some pain  BP 98/62   Pulse (!) 54   Temp 98.7 F (37.1 C) (Oral)   Resp (!) 21   Ht 5\' 9"  (1.753 m)   Wt 61.7 kg   SpO2 97%   BMI 20.09 kg/m    Intake/Output Summary (Last 24 hours) at 02/20/2018 1804 Last data filed at 02/20/2018 1728 Gross per 24 hour  Intake 1097.17 ml  Output 880 ml  Net 217.17 ml   HR in 50s this afternoon- restart pacer DDD @ [place]. Roxan Hockey, MD [facility] [phone]

## 2018-02-20 NOTE — Progress Notes (Signed)
Pt noted to have new difficulty swallowing. Weak wet cough after sipping on water both through a straw and straight from the cup w/o lid or straw. Pt has difficulty clearing secretions. Pt then became acutely nauseous and vomited up a small amount of water.   Attempted to teach pt chin-tuck method, but pt unable to do return demonstration. I'm concerned pt is at high risk for aspiration, so I've made her NPO until she can be evaluated further. Pt educated about this.  Will continue to monitor closely.

## 2018-02-20 NOTE — Progress Notes (Signed)
Consult placed to remove introducer on 2H. Spoke with Irena Cords, RN who states she has already removed introducer per order. Randol Kern, RN VAST Team

## 2018-02-20 NOTE — Progress Notes (Signed)
Anesthesiology Follow-up;  67 year old female 2 days S/P MVR with Maze procedure and closure of PFO. Sleepy, but arouses easily neuro intact.  VS: T- 37.1 BP- 142/91 HR 68 O2 Sat 97% on 3L O2   Rhythm is SR with severe 1st degree AV block and IVCD  K- 4.2 BUN/Cr 67/5.09 glucose- 187  H/H- 10.2/30.9 Platelets- 79,000  Extubated at 09:15 on POD #1  Renal medicine, dialysis catheter in place, may HD soon.  Jocelyn Hill

## 2018-02-20 NOTE — Progress Notes (Signed)
Pt much more alert this AM. Trialed some sips and chips, pt tolerated well with no change in respiratory status, able to clear secretions well. Proceeded to successfully swallow morning pills without issue. Will continue to monitor.

## 2018-02-21 ENCOUNTER — Inpatient Hospital Stay (HOSPITAL_COMMUNITY): Payer: Medicare Other

## 2018-02-21 LAB — TYPE AND SCREEN
ABO/RH(D): O POS
Antibody Screen: NEGATIVE
Unit division: 0
Unit division: 0
Unit division: 0
Unit division: 0

## 2018-02-21 LAB — BPAM RBC
Blood Product Expiration Date: 201909172359
Blood Product Expiration Date: 201909172359
Blood Product Expiration Date: 201909232359
Blood Product Expiration Date: 201909232359
ISSUE DATE / TIME: 201908220852
ISSUE DATE / TIME: 201908220852
ISSUE DATE / TIME: 201908221150
ISSUE DATE / TIME: 201908221150
Unit Type and Rh: 5100
Unit Type and Rh: 5100
Unit Type and Rh: 5100
Unit Type and Rh: 5100

## 2018-02-21 LAB — BASIC METABOLIC PANEL
Anion gap: 16 — ABNORMAL HIGH (ref 5–15)
BUN: 84 mg/dL — ABNORMAL HIGH (ref 8–23)
CO2: 20 mmol/L — ABNORMAL LOW (ref 22–32)
Calcium: 8.4 mg/dL — ABNORMAL LOW (ref 8.9–10.3)
Chloride: 98 mmol/L (ref 98–111)
Creatinine, Ser: 5.31 mg/dL — ABNORMAL HIGH (ref 0.44–1.00)
GFR calc Af Amer: 9 mL/min — ABNORMAL LOW (ref >60)
GFR calc non Af Amer: 8 mL/min — ABNORMAL LOW (ref >60)
Glucose, Bld: 140 mg/dL — ABNORMAL HIGH (ref 70–99)
Potassium: 3.9 mmol/L (ref 3.5–5.1)
Sodium: 134 mmol/L — ABNORMAL LOW (ref 135–145)

## 2018-02-21 LAB — CBC
HCT: 26.9 % — ABNORMAL LOW (ref 36.0–46.0)
Hemoglobin: 9.3 g/dL — ABNORMAL LOW (ref 12.0–15.0)
MCH: 28.2 pg (ref 26.0–34.0)
MCHC: 34.6 g/dL (ref 30.0–36.0)
MCV: 81.5 fL (ref 78.0–100.0)
Platelets: 89 10*3/uL — ABNORMAL LOW (ref 150–400)
RBC: 3.3 MIL/uL — ABNORMAL LOW (ref 3.87–5.11)
RDW: 15.1 % (ref 11.5–15.5)
WBC: 17.8 10*3/uL — ABNORMAL HIGH (ref 4.0–10.5)

## 2018-02-21 LAB — PROTIME-INR
INR: 1.26
Prothrombin Time: 15.7 seconds — ABNORMAL HIGH (ref 11.4–15.2)

## 2018-02-21 MED ORDER — CHLORHEXIDINE GLUCONATE CLOTH 2 % EX PADS
6.0000 | MEDICATED_PAD | Freq: Every day | CUTANEOUS | Status: DC
  Administered 2018-02-23 – 2018-02-25 (×3): 6 via TOPICAL

## 2018-02-21 MED ORDER — HYDRALAZINE HCL 50 MG PO TABS
100.0000 mg | ORAL_TABLET | Freq: Three times a day (TID) | ORAL | Status: DC
  Administered 2018-02-21 – 2018-03-05 (×36): 100 mg via ORAL
  Filled 2018-02-21 (×36): qty 2

## 2018-02-21 NOTE — Progress Notes (Signed)
3 Days Post-Op Procedure(s) (LRB): MITRAL VALVE (MV) REPLACEMENT (N/A) CORONARY ARTERY BYPASS GRAFTING (CABG) WITH IMA. ENDOSCOPIC VEIN HARVEST. IMA TO LAD, SVG TO CIRC. (N/A) MAZE (N/A) TRANSESOPHAGEAL ECHOCARDIOGRAM (TEE) (N/A) PLACEMENT OF TEMPORARY HD CATHETER, POWER TRIALYSIS 13FR 20CM (N/A) PATENT FORAMEN OVALE (PFO) CLOSURE (N/A) Subjective: C/o pain   Objective: Vital signs in last 24 hours: Temp:  [97.8 F (36.6 C)-98.6 F (37 C)] 98.6 F (37 C) (08/25 0400) Pulse Rate:  [48-84] 82 (08/25 0800) Cardiac Rhythm: Normal sinus rhythm;A-V Sequential paced (08/25 0700) Resp:  [14-25] 23 (08/25 0800) BP: (98-157)/(61-100) 130/91 (08/25 0818) SpO2:  [93 %-100 %] 94 % (08/25 0800) Weight:  [62.1 kg] 62.1 kg (08/25 0500)  Hemodynamic parameters for last 24 hours:    Intake/Output from previous day: 08/24 0701 - 08/25 0700 In: 1001.5 [P.O.:150; I.V.:694.3; IV Piggyback:157.2] Out: 995 [Urine:895; Chest Tube:100] Intake/Output this shift: No intake/output data recorded.  General appearance: alert, cooperative and mild distress Neurologic: intact Heart: regular rate and rhythm and paced Lungs: diminished breath sounds on left Abdomen: normal findings: soft, non-tender  Lab Results: Recent Labs    02/20/18 0339 02/21/18 0355  WBC 18.8* 17.8*  HGB 10.2* 9.3*  HCT 30.9* 26.9*  PLT 79* 89*   BMET:  Recent Labs    02/20/18 0339 02/21/18 0355  NA 137 134*  K 4.2 3.9  CL 102 98  CO2 21* 20*  GLUCOSE 187* 140*  BUN 67* 84*  CREATININE 5.09* 5.31*  CALCIUM 9.1 8.4*    PT/INR:  Recent Labs    02/21/18 0355  LABPROT 15.7*  INR 1.26   ABG    Component Value Date/Time   PHART 7.297 (L) 02/19/2018 1053   HCO3 21.3 02/19/2018 1053   TCO2 23 02/19/2018 1702   ACIDBASEDEF 5.0 (H) 02/19/2018 1053   O2SAT 97.0 02/19/2018 1053   CBG (last 3)  Recent Labs    02/19/18 0707 02/19/18 0808 02/20/18 0116  GLUCAP 158* 158* 168*    Assessment/Plan: S/P  Procedure(s) (LRB): MITRAL VALVE (MV) REPLACEMENT (N/A) CORONARY ARTERY BYPASS GRAFTING (CABG) WITH IMA. ENDOSCOPIC VEIN HARVEST. IMA TO LAD, SVG TO CIRC. (N/A) MAZE (N/A) TRANSESOPHAGEAL ECHOCARDIOGRAM (TEE) (N/A) PLACEMENT OF TEMPORARY HD CATHETER, POWER TRIALYSIS 13FR 20CM (N/A) PATENT FORAMEN OVALE (PFO) CLOSURE (N/A) -  Cv- in sinus brady in the 40 range with pVCs continue pacing for now  Will hold on warfarin for now  RESP- Left pneumothorax is larger after removal of pleural tube yesterday (removal of mediastinal tubes was ordered).  Repeat CXR this afternoon, may require a chest tube  RENAL- creatinine up again this AM  UO ~ 800 with 120 mg of lasix BID  HD tomorrow AM if doesn't improve  ENDO- CBG reasonably well controlled  SCD for DVT prophylaxis  Thrombocytopenia- PLT count up to 89 K this AM, no signs of bleeding   LOS: 12 days    Jocelyn Hill 02/21/2018

## 2018-02-21 NOTE — Progress Notes (Signed)
Spoke with Dr. Roxan Hockey regarding patient's right arm status. Arm is warm to touch, swollen, and painful. Will continue to closely monitor.

## 2018-02-21 NOTE — Progress Notes (Signed)
Per RN, pt has a left pneumothorax and states flutter valve will be d/c.  Flutter valve not given.

## 2018-02-21 NOTE — Progress Notes (Signed)
Progress Note  Patient Name: Jocelyn Hill Date of Encounter: 02/21/2018  Primary Cardiologist: No primary care provider on file.   Subjective   Complains of severe pain constantly (even when she appears to be relatively sleepy and not in distress). Denies dyspnea. Urine output is mediocre around 800 mL / 24 hours, weight is up roughly half a kilo..  Renal parameters are worsening (BUN 84,creat 5.3) and she will likely undergo hemodialysis tomorrow. Blood pressure control is markedly improved, is on high dose of intravenous nitroglycerin (80 mcg/min). After a long period in sinus rhythm with first and Mobitz type I second-degree AV block yesterday, overnight she developed both sinus bradycardia and higher degree of AV block, now AV paced at 80 bpm.  02/18/2018  Mitral Valve Replacement Edwards Magna Mitral Bovine Bioprosthetic Tissue Valve (size72mm, model #7300TFX, serial #[id number])   [facility]aze Procedure complete bilateral atrial lesion set using bipolar radiofrequency and cryothermy ablation clipping of left atrial appendage (Atricure Pro245 left atrial clipsize 75mm)   Coronary Artery Bypass Grafting x2 Left Internal Mammary Artery to Distal Left Anterior Descending Coronary Artery Saphenous Vein Graft to DistalLeft Circumflex Coronary Artery Endoscopic Vein Harvest from RightThigh    Closure of Patent Foramen Ovale  Inpatient Medications    Scheduled Meds: . acetaminophen  1,000 mg Oral Q6H  . amLODipine  10 mg Oral Daily  . aspirin EC  325 mg Oral Daily  . atorvastatin  80 mg Oral Daily  . bisacodyl  10 mg Oral Daily   Or  . bisacodyl  10 mg Rectal Daily  . buPROPion  100 mg Oral TID AC  . Chlorhexidine Gluconate Cloth  6 each Topical Daily  . Chlorhexidine Gluconate Cloth  6 each Topical Q0600  . docusate sodium  200 mg Oral Daily  . enoxaparin (LOVENOX) injection  30 mg  Subcutaneous QHS  . hydrALAZINE  100 mg Oral Q8H  . levothyroxine  112 mcg Oral QAC breakfast  . mouth rinse  15 mL Mouth Rinse BID  . montelukast  10 mg Oral Daily  . pantoprazole  40 mg Oral Daily   Continuous Infusions: . furosemide 62 mL/hr at 02/21/18 0700  . lactated ringers Stopped (02/21/18 3716)  . nitroGLYCERIN 80 mcg/min (02/21/18 0700)   PRN Meds: ALPRAZolam, calcium carbonate, metoprolol tartrate, morphine injection, ondansetron (ZOFRAN) IV, oxyCODONE, sodium chloride flush, sodium chloride flush, traMADol   Vital Signs    Vitals:   02/21/18 0645 02/21/18 0700 02/21/18 0800 02/21/18 0818  BP: (!) 144/86 (!) 153/82 (!) 130/91 (!) 130/91  Pulse:   82   Resp: 16 14 (!) 23   Temp:      TempSrc:      SpO2:  96% 94%   Weight:      Height:        Intake/Output Summary (Last 24 hours) at 02/21/2018 0833 Last data filed at 02/21/2018 0700 Gross per 24 hour  Intake 908.58 ml  Output 945 ml  Net -36.42 ml   Filed Weights   02/19/18 0500 02/20/18 0428 02/21/18 0500  Weight: 65.6 kg 61.7 kg 62.1 kg    Telemetry    Currently AV sequential paced.  Problems with sinus bradycardia and higher grade second-degree Mobitz type I AV block overnight- Personally Reviewed  ECG    No new tracing, yesterday's tracing showed sinus rhythm with very long PR interval and occasional dropped P waves consistent with Mobitz type I second-degree AV block, LBBB (old)- Personally Reviewed  Physical Exam  Somewhat sleepy, but oriented GEN: No acute distress.   Neck: No JVD Cardiac: RRR, no murmurs, rubs, or gallops.  Respiratory: Clear to auscultation bilaterally. GI: Soft, nontender, non-distended  MS: No edema; No deformity. Neuro:  Nonfocal  Psych: Normal affect   Labs    Chemistry Recent Labs  Lab 02/15/18 0404 02/16/18 0416 02/17/18 0353  02/19/18 0341 02/19/18 1701 02/19/18 1702 02/20/18 0339 02/21/18 0355  NA 137 134* 135   < > 139  --  139 137 134*  K 4.9 4.2 4.2    < > 4.0  --  3.9 4.2 3.9  CL 97* 96* 96*   < > 106  --  102 102 98  CO2 26 28 30   --  25  --   --  21* 20*  GLUCOSE 123* 168* 136*   < > 148*  --  207* 187* 140*  BUN 53* 53* 55*   < > 56*  --  53* 67* 84*  CREATININE 4.94* 5.06* 5.49*   < > 4.40* 4.76* 5.10* 5.09* 5.31*  CALCIUM 10.2 10.1 10.1  --  9.1  --   --  9.1 8.4*  PROT  --   --  7.3  --   --   --   --   --   --   ALBUMIN 4.1 3.9 4.2  --   --   --   --   --   --   AST  --   --  16  --   --   --   --   --   --   ALT  --   --  15  --   --   --   --   --   --   ALKPHOS  --   --  96  --   --   --   --   --   --   BILITOT  --   --  0.9  --   --   --   --   --   --   GFRNONAA 8* 8* 7*   < > 10* 9*  --  8* 8*  GFRAA 10* 9* 8*   < > 11* 10*  --  9* 9*  ANIONGAP 14 10 9   --  8  --   --  14 16*   < > = values in this interval not displayed.     Hematology Recent Labs  Lab 02/19/18 1701 02/19/18 1702 02/20/18 0339 02/21/18 0355  WBC 18.1*  --  18.8* 17.8*  RBC 4.07  --  3.72* 3.30*  HGB 11.3* 11.6* 10.2* 9.3*  HCT 33.8* 34.0* 30.9* 26.9*  MCV 83.0  --  83.1 81.5  MCH 27.8  --  27.4 28.2  MCHC 33.4  --  33.0 34.6  RDW 15.3  --  15.3 15.1  PLT 93*  --  79* 89*    Cardiac Enzymes Recent Labs  Lab 02/14/18 1224  TROPONINI <0.03   No results for input(s): TROPIPOC in the last 168 hours.   BNP Recent Labs  Lab 02/17/18 0353  BNP 675.7*     DDimer No results for input(s): DDIMER in the last 168 hours.   Radiology    Dg Chest Port 1 View  Result Date: 02/21/2018 CLINICAL DATA:  Status post coronary bypass graft. EXAM: PORTABLE CHEST 1 VIEW COMPARISON:  Radiograph February 20, 2018. FINDINGS: Stable cardiomegaly with central pulmonary vascular congestion. Stable position  of left internal jugular catheter with tip in expected position of the SVC. Stable position of right internal jugular catheter with tip at expected position of cavoatrial junction. Status post coronary artery bypass graft and mitral valve repair. Right  lung is clear. Left-sided chest tube has been removed. Moderate left apical pneumothorax is noted which is significantly increased in size compared to prior exam, on the order of 30-40%. Bony thorax is unremarkable. IMPRESSION: Stable cardiomegaly with central pulmonary vascular congestion. Significantly increased left apical pneumothorax is noted status post left-sided chest tube removal. Electronically Signed   By: Marijo Conception, M.D.   On: 02/21/2018 07:31   Dg Chest Port 1 View  Result Date: 02/20/2018 CLINICAL DATA:  Follow-up congestive heart failure.  Extubated. EXAM: PORTABLE CHEST 1 VIEW COMPARISON:  02/19/2018 FINDINGS: Endotracheal tube and nasogastric tube have been removed. Swan-Ganz catheter is been removed. Bilateral chest tubes remain in place. Right internal jugular central line remains in place with its tip at the SVC RA junction. Mediastinal drains remain in place. Left internal jugular central line tip at the innominate SVC junction. Left internal jugular venous access sheath remains in place. No pneumothorax seen on the right. Small amount of apical pleural air seen on the left. Mild bibasilar atelectasis persists. IMPRESSION: Endotracheal tube, nasogastric tube and Swan-Ganz catheter removed. Persistent left apical pneumothorax, 5% or less. Persistent basilar atelectasis. Electronically Signed   By: Nelson Chimes M.D.   On: 02/20/2018 08:03    Cardiac Studies   Cath 07/03/2017 1. Low filling pressure and preserved cardiac output.  2. 90% ostial LCx stenosis.  3. Napkin ring-like stenosis in the proximal LAD, at least 60% stenosis.   ECHO 02/10/2018 - Left ventricle: The cavity size was mildly dilated. There was moderate concentric hypertrophy. Systolic function was normal. The estimated ejection fraction was in the range of 55% to 60%. Wall motion was normal; there were no regional wall motion abnormalities. Features are consistent with a pseudonormal  left ventricular filling pattern, with concomitant abnormal relaxation and increased filling pressure (grade 2 diastolic dysfunction). Doppler parameters are consistent with high ventricular filling pressure. - Mitral valve: Mild thickening and calcification of the anterior leaflet, consistent with rheumatic disease. Mobility was restricted. Diastolic leaflet doming was present. The findings are consistent with trivial stenosis with mean MV gradient of 32mmHg. There was moderate regurgitation. Effective regurgitant orifice (PISA): 0.17 cm^2. Regurgitant volume (PISA): 24 ml. - Left atrium: The atrium was moderately dilated. - Tricuspid valve: There was mild regurgitation. - Pulmonary arteries: PA peak pressure: 39 mm Hg (S). - Pericardium, extracardiac: A trivial pericardial effusion was identified posterior to the heart.  Impressions:  - Mildly dilated LV with moderate LVF and normal LVF with EF 55-60%. There is moderate LAE, mild TR and mild pulmonary HTN with PASP 7mmHg. The MV appears rheumatic with thickening and diastolic doming. There appears to be at least moderate MR with ERO of 0.17cm2 and regurgitant volume of 24cc. The right ventricular systolic pressure was increased consistent with mild pulmonary hypertension.   TEE intraop 02/18/2018  Right ventricle: Right ventricular cavity was normal in size. There was normal RV systolic function.  Tricuspid valve: The tricuspid valve appeared structurally normal. There was trace tricuspid insufficiency.  Pulmonic valve: The pulmonic valve appeared structurally normal. There was trace pulmonic insufficiency.  Mitral valve: The mitral leaflets were thickened but appeared to open without restriction. There was severe mitral regurgitation with a central jet. The ERO by the PISA method was  0.5 cm. The regurgitant volume was 70 cc. There were no flail or prolapsing leaflet segments. On the post  bypass exam, there was a bioprosthetic valve in the mitral position. The leaflets opened normally. There was a central jet of mitral insufficiency that was mild. After careful interrogation of the sewing  Right atrium: Cavity is mildly dilated.  Patient Profile     67 y.o. female with severe 2 vessel CAD, severe MR, preserved LVEF, paroxysmal atrial fibrillation, CKD stage 4, severe HTN now s/p CABG/MVR/MAZE on 08/22, had postop complete heart block, subsequently second-degree AV block Mobitz type I  Assessment & Plan    1. CAD s/p CABG: has surgical pain.  LBBB is pre-existing. 2. MR s/p MVR:  No rumble or murmur is heard on exam 3. CHB:   Solved, but had significant bradycardia related to both slow sinus rhythm and higher grade second-degree Mobitz type I AV block, now AV paced, underlying rhythm in the 40s.  Degree of bradycardia may have some correlation with her degree of alertness.  Avoid beta-blockers, centrally acting calcium channel blockers, any other negative inotropes.  At this point it does not appear that she will require permanent pacemaker. 4. AFib s/p MAZE: no afib seen on monitor since surgery 5. HTN: Increase hydralazine dose, continue amlodipine, cannot use beta blockers or RAAS inhibitors.  Try to wean off intravenous nitroglycerin if possible. 6. Acute on chronic stage 4 renal insufficiency:   Plan to perform dialysis tomorrow morning at bedside. 7. L apical pneumothorax: Now moderate in size after left-sided chest tube pulled out, may require replacement of the chest tube.      For questions or updates, please contact [place] Please consult [email or web address] for contact info under Cardiology/STEMI.      Signed, Sanda Klein, MD  02/21/2018, 8:33 AM

## 2018-02-21 NOTE — Progress Notes (Signed)
Subjective:  C/o 9/10 pain- this was c/w behavior pre surgery- only 820 of urine- kidney labs worse  Objective Vital signs in last 24 hours: Vitals:   02/21/18 0500 02/21/18 0633 02/21/18 0645 02/21/18 0700  BP: (!) 149/83 (!) 157/90 (!) 144/86 (!) 153/82  Pulse: 80     Resp: 19  16 14   Temp:      TempSrc:      SpO2: 96%   96%  Weight: 62.1 kg     Height:       Weight change: 0.397 kg  Intake/Output Summary (Last 24 hours) at 02/21/2018 0740 Last data filed at 02/21/2018 0354 Gross per 24 hour  Intake 948.56 ml  Output 890 ml  Net 58.56 ml    Assessment/ Plan: Pt is a 67 y.o. yo female with DM, HTN with CAD, MV dz- also with advanced CKD at baseline- crt around 4 who was admitted on 02/09/2018 with accelerated angina req escalation of surgical correction- s/p CABG/MAZE/ MVR on 8/22 Assessment/Plan: 1. Renal- advanced CKD at baseline- perm access was deferred secondary to cardiac pathology needing surgical correction.  Felt there was a good probability she would require dialytic support after surgery- vascath in place since 8/22. Kidney function labs worse today - vomiting this AM- UOP down some- I think we should go ahead and do dialysis on her- will plan for tomorrow AM at bedside 2. HTN/Vol- BP is still relatively high- norvasc 10/ lasix 120 BID- pre surgery- now on NTG. Weight/volume status better pre op- weight down a lot. Lasix has been continued - probably euvolemic- CVP 6 yesterday- now hydralazine has been added  3. Anemia- hgb dropping after surgery - will add EAS 4. Bones- phos was OK - no binder- last PTH 235- on calcitriol- held right now periop-  5. CV- s/p CABG/MAZE/MVR 8/22- seems to be doing well postop   6. Lethargy-  Continues- now with BUN of 80's this might be contributing- will plan HD tomorrow      Kyndahl Jablon A    Labs: Basic Metabolic Panel: Recent Labs  Lab 02/15/18 0404 02/16/18 0416 02/17/18 0353  02/19/18 0341  02/19/18 1702  02/20/18 0339 02/21/18 0355  NA 137 134* 135   < > 139  --  139 137 134*  K 4.9 4.2 4.2   < > 4.0  --  3.9 4.2 3.9  CL 97* 96* 96*   < > 106  --  102 102 98  CO2 26 28 30   --  25  --   --  21* 20*  GLUCOSE 123* 168* 136*   < > 148*  --  207* 187* 140*  BUN 53* 53* 55*   < > 56*  --  53* 67* 84*  CREATININE 4.94* 5.06* 5.49*   < > 4.40*   < > 5.10* 5.09* 5.31*  CALCIUM 10.2 10.1 10.1  --  9.1  --   --  9.1 8.4*  PHOS 5.0* 5.1* 5.2*  --   --   --   --   --   --    < > = values in this interval not displayed.   Liver Function Tests: Recent Labs  Lab 02/15/18 0404 02/16/18 0416 02/17/18 0353  AST  --   --  16  ALT  --   --  15  ALKPHOS  --   --  96  BILITOT  --   --  0.9  PROT  --   --  7.3  ALBUMIN 4.1 3.9 4.2   No results for input(s): LIPASE, AMYLASE in the last 168 hours. No results for input(s): AMMONIA in the last 168 hours. CBC: Recent Labs  Lab 02/18/18 2104 02/19/18 0341 02/19/18 1701 02/19/18 1702 02/20/18 0339 02/21/18 0355  WBC 17.7* 16.7* 18.1*  --  18.8* 17.8*  NEUTROABS 16.0*  --   --   --   --   --   HGB 11.9* 11.8* 11.3* 11.6* 10.2* 9.3*  HCT 35.6* 35.0* 33.8* 34.0* 30.9* 26.9*  MCV 82.4 82.5 83.0  --  83.1 81.5  PLT 141* 119* 93*  --  79* 89*   Cardiac Enzymes: Recent Labs  Lab 02/14/18 1224  TROPONINI <0.03   CBG: Recent Labs  Lab 02/19/18 0454 02/19/18 0603 02/19/18 0707 02/19/18 0808 02/20/18 0116  GLUCAP 127* 122* 158* 158* 168*    Iron Studies: No results for input(s): IRON, TIBC, TRANSFERRIN, FERRITIN in the last 72 hours. Studies/Results: Dg Chest Port 1 View  Result Date: 02/21/2018 CLINICAL DATA:  Status post coronary bypass graft. EXAM: PORTABLE CHEST 1 VIEW COMPARISON:  Radiograph February 20, 2018. FINDINGS: Stable cardiomegaly with central pulmonary vascular congestion. Stable position of left internal jugular catheter with tip in expected position of the SVC. Stable position of right internal jugular catheter with tip at  expected position of cavoatrial junction. Status post coronary artery bypass graft and mitral valve repair. Right lung is clear. Left-sided chest tube has been removed. Moderate left apical pneumothorax is noted which is significantly increased in size compared to prior exam, on the order of 30-40%. Bony thorax is unremarkable. IMPRESSION: Stable cardiomegaly with central pulmonary vascular congestion. Significantly increased left apical pneumothorax is noted status post left-sided chest tube removal. Electronically Signed   By: Marijo Conception, M.D.   On: 02/21/2018 07:31   Dg Chest Port 1 View  Result Date: 02/20/2018 CLINICAL DATA:  Follow-up congestive heart failure.  Extubated. EXAM: PORTABLE CHEST 1 VIEW COMPARISON:  02/19/2018 FINDINGS: Endotracheal tube and nasogastric tube have been removed. Swan-Ganz catheter is been removed. Bilateral chest tubes remain in place. Right internal jugular central line remains in place with its tip at the SVC RA junction. Mediastinal drains remain in place. Left internal jugular central line tip at the innominate SVC junction. Left internal jugular venous access sheath remains in place. No pneumothorax seen on the right. Small amount of apical pleural air seen on the left. Mild bibasilar atelectasis persists. IMPRESSION: Endotracheal tube, nasogastric tube and Swan-Ganz catheter removed. Persistent left apical pneumothorax, 5% or less. Persistent basilar atelectasis. Electronically Signed   By: Nelson Chimes M.D.   On: 02/20/2018 08:03   Medications: Infusions: . furosemide 120 mg (02/21/18 0635)  . lactated ringers 10 mL/hr at 02/21/18 0600  . nitroGLYCERIN 80 mcg/min (02/21/18 0600)    Scheduled Medications: . acetaminophen  1,000 mg Oral Q6H  . amLODipine  10 mg Oral Daily  . aspirin EC  325 mg Oral Daily  . atorvastatin  80 mg Oral Daily  . bisacodyl  10 mg Oral Daily   Or  . bisacodyl  10 mg Rectal Daily  . buPROPion  100 mg Oral TID AC  .  Chlorhexidine Gluconate Cloth  6 each Topical Daily  . docusate sodium  200 mg Oral Daily  . enoxaparin (LOVENOX) injection  30 mg Subcutaneous QHS  . hydrALAZINE  25 mg Oral Q8H  . levothyroxine  112 mcg Oral QAC breakfast  . mouth rinse  15 mL Mouth  Rinse BID  . montelukast  10 mg Oral Daily  . pantoprazole  40 mg Oral Daily    have reviewed scheduled and prn medications.  Physical Exam: General: arousable "not good"  Concerned about pain meds  Heart: RRR Lungs:  dec BS at bases Abdomen: soft, non tender Extremities:  Really no edema    02/21/2018,7:40 AM  LOS: 12 days

## 2018-02-21 NOTE — Progress Notes (Signed)
      [address]       [place] [address]             [phone]      Pain a little better BP 127/75   Pulse 80   Temp 98.9 F (37.2 C) (Oral)   Resp 16   Ht 5\' 9"  (1.753 m)   Wt 62.1 kg   SpO2 95%   BMI 20.22 kg/m   Intake/Output Summary (Last 24 hours) at 02/21/2018 1829 Last data filed at 02/21/2018 1700 Gross per 24 hour  Intake 1277.72 ml  Output 1474 ml  Net -196.28 ml   864 cc of urine so far today- continue to follow, repeat labs in AM F/u CXR, left pneumothorax unchanged to slightly smaller- recheck in AM  [place] C. Roxan Hockey, MD [facility] [phone]

## 2018-02-22 ENCOUNTER — Inpatient Hospital Stay (HOSPITAL_COMMUNITY): Payer: Medicare Other

## 2018-02-22 DIAGNOSIS — I361 Nonrheumatic tricuspid (valve) insufficiency: Secondary | ICD-10-CM

## 2018-02-22 LAB — CBC
HCT: 25.7 % — ABNORMAL LOW (ref 36.0–46.0)
Hemoglobin: 8.8 g/dL — ABNORMAL LOW (ref 12.0–15.0)
MCH: 27.8 pg (ref 26.0–34.0)
MCHC: 34.2 g/dL (ref 30.0–36.0)
MCV: 81.3 fL (ref 78.0–100.0)
Platelets: 120 10*3/uL — ABNORMAL LOW (ref 150–400)
RBC: 3.16 MIL/uL — ABNORMAL LOW (ref 3.87–5.11)
RDW: 14.8 % (ref 11.5–15.5)
WBC: 12.5 10*3/uL — ABNORMAL HIGH (ref 4.0–10.5)

## 2018-02-22 LAB — ECHOCARDIOGRAM COMPLETE
Height: 69 in
Weight: 2151.69 oz

## 2018-02-22 LAB — BASIC METABOLIC PANEL
Anion gap: 16 — ABNORMAL HIGH (ref 5–15)
BUN: 91 mg/dL — ABNORMAL HIGH (ref 8–23)
CO2: 20 mmol/L — ABNORMAL LOW (ref 22–32)
Calcium: 8 mg/dL — ABNORMAL LOW (ref 8.9–10.3)
Chloride: 93 mmol/L — ABNORMAL LOW (ref 98–111)
Creatinine, Ser: 5.53 mg/dL — ABNORMAL HIGH (ref 0.44–1.00)
GFR calc Af Amer: 8 mL/min — ABNORMAL LOW (ref >60)
GFR calc non Af Amer: 7 mL/min — ABNORMAL LOW (ref >60)
Glucose, Bld: 122 mg/dL — ABNORMAL HIGH (ref 70–99)
Potassium: 3.4 mmol/L — ABNORMAL LOW (ref 3.5–5.1)
Sodium: 129 mmol/L — ABNORMAL LOW (ref 135–145)

## 2018-02-22 LAB — PROTIME-INR
INR: 1.15
Prothrombin Time: 14.6 seconds (ref 11.4–15.2)

## 2018-02-22 MED ORDER — LIDOCAINE-PRILOCAINE 2.5-2.5 % EX CREA
1.0000 "application " | TOPICAL_CREAM | CUTANEOUS | Status: DC | PRN
  Filled 2018-02-22: qty 5

## 2018-02-22 MED ORDER — SODIUM CHLORIDE 0.9 % IV SOLN: 100.0000 mL | INTRAVENOUS | Status: DC | PRN

## 2018-02-22 MED ORDER — POTASSIUM CHLORIDE 10 MEQ/50ML IV SOLN
10.0000 meq | INTRAVENOUS | Status: DC | PRN
  Administered 2018-02-23: 10 meq via INTRAVENOUS
  Filled 2018-02-22 (×2): qty 50

## 2018-02-22 MED ORDER — PENTAFLUOROPROP-TETRAFLUOROETH EX AERO: 1.0000 "application " | INHALATION_SPRAY | CUTANEOUS | Status: DC | PRN

## 2018-02-22 MED ORDER — ALTEPLASE 2 MG IJ SOLR: 2.0000 mg | Freq: Once | INTRAMUSCULAR | Status: DC | PRN

## 2018-02-22 MED ORDER — DOXAZOSIN MESYLATE 2 MG PO TABS
2.0000 mg | ORAL_TABLET | Freq: Every day | ORAL | Status: DC
  Administered 2018-02-22: 2 mg via ORAL
  Filled 2018-02-22 (×2): qty 1

## 2018-02-22 MED ORDER — LIDOCAINE HCL (PF) 1 % IJ SOLN: 5.0000 mL | INTRAMUSCULAR | Status: DC | PRN

## 2018-02-22 MED ORDER — SODIUM CHLORIDE 0.9 % IV SOLN
100.0000 mL | INTRAVENOUS | Status: DC | PRN
  Administered 2018-02-25: 100 mL via INTRAVENOUS

## 2018-02-22 MED ORDER — ISOSORBIDE MONONITRATE ER 60 MG PO TB24
120.0000 mg | ORAL_TABLET | Freq: Every day | ORAL | Status: DC
  Administered 2018-02-22 – 2018-03-05 (×12): 120 mg via ORAL
  Filled 2018-02-22 (×14): qty 2

## 2018-02-22 MED ORDER — HEPARIN SODIUM (PORCINE) 1000 UNIT/ML DIALYSIS
1000.0000 [IU] | INTRAMUSCULAR | Status: DC | PRN
  Filled 2018-02-22: qty 1

## 2018-02-22 NOTE — Consult Note (Addendum)
Cardiology Consultation:   Patient ID: Jocelyn Hill; [id number]; 1950-07-31   Admit date: 02/09/2018 Date of Consult: 02/22/2018  Primary Care Provider: [name], MD Primary Cardiologist: Dr. Aundra Dubin    Patient Profile:   Jocelyn Hill is a 67 y.o. 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 Aundra Dubin.  History of Present Illness:   Jocelyn Hill has extensive history, last year she presented to [facility] on 03/12/2017 with acute hypoxic respiratory failure. ECHO showed normal LV function with severe MR. Diuresed with IV lasix over 20 pounds. She was being considered for MVR by Dr Roxy Manns but due to multiple complications she was not a candidate. Hospital course was complicated by acute respiratory failure, acute renalfailure, atrial fibrillation, retroperitoneal bleed, r hydronephrosis (perc tube) and E coli bacteremia. She was started on anticoagulation for atrial fibrillation but this was stopped with RP hematoma.  She converted to NSR on amiodarone but developed junctional rhythm so amiodarone was stopped. Creatinine peaked at 5.  She did not require dialysis and gradually renal function improved.  Another CHF exacerbation Oc 2018 requiring hospitalization.  Diuresing with high dose IV lasix.  Nephrology consulted, renal US with no evidence of hydronephrosis (pt had percutaneous nephrostomy drain the month prior due to this). Also placed on NTG drip for HTN and CP. She was seen by Dr. Roxy Manns again and felt still to not be a MVR candidate.    Ultimately planned for surgery, though admitted with CHF exacerbation, escalating CP on 02/09/18.  02/18/18:  CABG,Left Internal Mammary Artery to Distal Left Anterior Descending Coronary Artery             Saphenous Vein Graft to Distal Left Circumflex Coronary Artery  MVR [facility] Mitral Bovine Bioprosthetic Tissue Valve (size 29 mm,  model #[id number], serial #[id number]) MAZE PFO closure Placement of Right Internal Jugular Tridialysis temporary hemodialysis catheter   Extubated 02/19/18, small L ptx Planned to start HD today, nephrology optimistic this may not be a chronic need   LABS today NA+ 129 K+ 3.4 BUN/Creat 91/5.53 WBC 12.5 (trending down) H/H 8.8/25.7 (trending down) Plts 120  Generally feeling pretty weak yet.  No CP, palpitations, no rest SOB  Past Medical History:  Diagnosis Date  . Anemia    low iron  . Anxiety   . Arthritis   . Bronchitis   . CHF (congestive heart failure) ([place])   . CKD (chronic kidney disease) stage 4, GFR 15-29 ml/min (HCC) 03/16/2017  . Coronary artery disease   . Depression   . Diabetes mellitus without complication ([place])   . Diet-controlled diabetes mellitus ([place])   . Ganglion cyst    left wrist  . GERD (gastroesophageal reflux disease)   . Headache    chronic headaches  . Heart failure ([place])   . Hyperlipidemia   . Hypertension   . Hypothyroidism   . Mitral regurgitation   . Myocardial infarction ([place])    3x last one 2008  . PAF (paroxysmal atrial fibrillation) ([place])   . Pneumonia    x 2  . S/P Maze operation for atrial fibrillation 02/18/2018   Complete bilateral atrial lesion set using bipolar radiofrequency and cryothermy ablation with clipping of LA appendage  . S/P mitral valve replacement with bioprosthetic valve 02/18/2018   [facility] Mitral stented bovine pericardial tissue valve Model 7300 TFX Serial # [id number] Size  29  . Stroke ([place])    no lasting residual - ? 2014  . Umbilical hernia     Past Surgical History:  Procedure Laterality Date  . ABDOMINAL HYSTERECTOMY     PARTIALS  . CARDIAC CATHETERIZATION    . CESAREAN SECTION    . CORONARY ARTERY BYPASS GRAFT N/A 02/18/2018   Procedure: CORONARY ARTERY BYPASS GRAFTING (CABG) WITH IMA. ENDOSCOPIC VEIN HARVEST. IMA TO LAD, SVG TO CIRC.;  Surgeon: Rexene Alberts, MD;  Location: [place];  Service:  Open Heart Surgery;  Laterality: N/A;  . INSERTION OF DIALYSIS CATHETER N/A 02/18/2018   Procedure: PLACEMENT OF TEMPORARY HD CATHETER, POWER TRIALYSIS 13FR 20CM;  Surgeon: Rexene Alberts, MD;  Location: [place];  Service: Vascular;  Laterality: N/A;  . IR NEPHRO TUBE REMOV/FL  04/02/2017  . IR NEPHROSTOGRAM RIGHT THRU EXISTING ACCESS  04/02/2017  . IR NEPHROSTOMY PLACEMENT RIGHT  03/27/2017  . IR THORACENTESIS ASP PLEURAL SPACE W/IMG GUIDE  03/24/2017  . MAZE N/A 02/18/2018   Procedure: MAZE;  Surgeon: Rexene Alberts, MD;  Location: [place];  Service: Open Heart Surgery;  Laterality: N/A;  . MITRAL VALVE REPLACEMENT N/A 02/18/2018   Procedure: MITRAL VALVE (MV) REPLACEMENT;  Surgeon: Rexene Alberts, MD;  Location: [place];  Service: Open Heart Surgery;  Laterality: N/A;  . MULTIPLE EXTRACTIONS WITH ALVEOLOPLASTY N/A 11/05/2017   Procedure: Extraction of tooth #'s 4,5,7,8,9,10,12,14 and 29 with alveoloplasty and gross debridement of remaining teeth;  Surgeon: [name], DDS;  Location: [place];  Service: Oral Surgery;  Laterality: N/A;  . PATENT FORAMEN OVALE(PFO) CLOSURE N/A 02/18/2018   Procedure: PATENT FORAMEN OVALE (PFO) CLOSURE;  Surgeon: Rexene Alberts, MD;  Location: [place];  Service: Open Heart Surgery;  Laterality: N/A;  . RIGHT/LEFT HEART CATH AND CORONARY ANGIOGRAPHY N/A 07/03/2017   Procedure: RIGHT/LEFT HEART CATH AND CORONARY ANGIOGRAPHY;  Surgeon: Larey Dresser, MD;  Location: [place] CV LAB;  Service: Cardiovascular;  Laterality: N/A;  . TEE WITHOUT CARDIOVERSION N/A 10/08/2017   Procedure: TRANSESOPHAGEAL ECHOCARDIOGRAM (TEE);  Surgeon: Larey Dresser, MD;  Location: [facility] ENDOSCOPY;  Service: Cardiovascular;  Laterality: N/A;  . TEE WITHOUT CARDIOVERSION N/A 02/18/2018   Procedure: TRANSESOPHAGEAL ECHOCARDIOGRAM (TEE);  Surgeon: Rexene Alberts, MD;  Location: [place];  Service: Open Heart Surgery;  Laterality: N/A;  . TONSILLECTOMY    . TUBAL LIGATION       Home  Medications:  Prior to Admission medications   Medication Sig Start Date End Date Taking? Authorizing Provider  acetaminophen (TYLENOL) 325 MG tablet Take 2 tablets (650 mg total) by mouth every 6 (six) hours as needed for mild pain, fever or headache. 04/23/17  Yes Allie Bossier, MD  albuterol (PROVENTIL HFA;VENTOLIN HFA) 108 (90 Base) MCG/ACT inhaler Inhale 2 puffs into the lungs every 6 (six) hours as needed for wheezing or shortness of breath.   Yes [provider]  ALPRAZolam (XANAX) 0.25 MG tablet Take 1 tablet (0.25 mg total) by mouth at bedtime as needed for anxiety. 01/26/18  Yes Larey Dresser, MD  amLODipine (NORVASC) 10 MG tablet Take 1 tablet (10 mg total) by mouth daily. 04/24/17  Yes Allie Bossier, MD  aspirin EC 81 MG EC tablet Take 1 tablet (81 mg total) by mouth daily. 04/24/17  Yes Allie Bossier, MD  atorvastatin (LIPITOR) 40 MG tablet Take 1 tablet (40 mg total) by mouth daily. 04/24/17  Yes Allie Bossier, MD  b complex  vitamins tablet Take 1 tablet by mouth daily.   Yes [provider]  bisacodyl (DULCOLAX) 5 MG EC tablet Take 5 mg by mouth daily as needed for mild constipation or moderate constipation.   Yes [provider]  buPROPion (WELLBUTRIN) 100 MG tablet Take 100 mg by mouth 3 (three) times daily.   Yes [provider]  busPIRone (BUSPAR) 15 MG tablet Take 1 tablet (15 mg total) by mouth 2 (two) times daily. 04/23/17  Yes Allie Bossier, MD  calcium carbonate (TUMS EX) 750 MG chewable tablet Chew 2 tablets by mouth as needed for heartburn.   Yes [provider]  carvedilol (COREG) 25 MG tablet Take 25 mg by mouth 2 (two) times daily with a meal.   Yes [provider]  chlorhexidine (PERIDEX) 0.12 % solution Rinse with 15 mls twice daily for 30 seconds. Use after breakfast and at bedtime. Spit out excess. Do not swallow. Patient taking differently: 15 mLs by Mouth Rinse route 2 (two) times daily as needed (for  mouth discomfort). Rinse with 15 mls twice daily for 30 seconds. Use after breakfast and at bedtime. Spit out excess. Do not swallow. 11/16/17  Yes [name], DDS  cholecalciferol (VITAMIN D) 1000 units tablet Take 1,000 Units by mouth daily.   Yes [provider]  diphenhydrAMINE (BENADRYL) 25 MG tablet Take 25 mg by mouth as needed for allergies.   Yes [provider]  doxazosin (CARDURA) 8 MG tablet Take 8 mg by mouth daily. Takes 1 tablet in the AM and 0.5 Tablet in the PM.   Yes [provider]  DULoxetine (CYMBALTA) 30 MG capsule Take 1 capsule (30 mg total) by mouth daily. 04/24/17  Yes Allie Bossier, MD  ELIQUIS 2.5 MG TABS tablet Take 2.5 mg by mouth 2 (two) times daily. 12/03/17  Yes [provider]  furosemide (LASIX) 80 MG tablet Take 1.5 tablets (120 mg total) by mouth 2 (two) times daily. 01/26/18  Yes Larey Dresser, MD  gabapentin (NEURONTIN) 300 MG capsule Take 300 mg by mouth at bedtime. 10/02/17  Yes [provider]  hydrALAZINE (APRESOLINE) 100 MG tablet Take 100 mg by mouth 2 (two) times daily.   Yes [provider]  isosorbide mononitrate (IMDUR) 120 MG 24 hr tablet Take 1 tablet (120 mg total) by mouth daily. 04/24/17  Yes Allie Bossier, MD  levothyroxine (SYNTHROID, LEVOTHROID) 112 MCG tablet Take 1 tablet (112 mcg total) by mouth daily before breakfast. 04/24/17  Yes Allie Bossier, MD  Liniments (BEN GAY EX) Apply 1 application topically as needed (back pain).   Yes [provider]  montelukast (SINGULAIR) 10 MG tablet Take 1 tablet (10 mg total) by mouth daily. Patient taking differently: Take 10 mg by mouth at bedtime.  04/24/17  Yes Allie Bossier, MD  nitroGLYCERIN (NITROSTAT) 0.4 MG SL tablet Place 1 tablet (0.4 mg total) under the tongue every 5 (five) minutes as needed for chest pain. 04/23/17  Yes Allie Bossier, MD  Nutritional Supplements (CARNATION BREAKFAST ESSENTIALS PO) Take 237 mLs by  mouth 2 (two) times daily.   Yes [provider]  ondansetron (ZOFRAN) 4 MG tablet Take 1 tablet (4 mg total) by mouth every 8 (eight) hours as needed for nausea or vomiting. 04/23/17 04/23/18 Yes Allie Bossier, MD  oxyCODONE (OXY IR/ROXICODONE) 5 MG immediate release tablet Take 1 tablet (5 mg total) by mouth every 8 (eight) hours as needed (pain). 04/23/17  Yes Allie Bossier, MD  oxyCODONE-acetaminophen (PERCOCET) 5-325 MG tablet Take one or two tablets by mouth every 6 hours as needed for pain. 11/05/17  Yes [name], DDS  pantoprazole (PROTONIX) 40 MG tablet Take 1 tablet (40 mg total) by mouth 2 (two) times daily before a meal. Patient taking differently: Take 40 mg by mouth daily before breakfast.  04/23/17  Yes Allie Bossier, MD  potassium chloride SA (K-DUR,KLOR-CON) 20 MEQ tablet Take 2 tablets (40 mEq total) by mouth daily. 02/03/18  Yes Larey Dresser, MD  sodium-potassium bicarbonate (ALKA-SELTZER GOLD) TBEF dissolvable tablet Take 4 tablets by mouth daily as needed (heartburn).   Yes [provider]    Inpatient Medications: Scheduled Meds: . acetaminophen  1,000 mg Oral Q6H  . amLODipine  10 mg Oral Daily  . aspirin EC  325 mg Oral Daily  . atorvastatin  80 mg Oral Daily  . buPROPion  100 mg Oral TID AC  . Chlorhexidine Gluconate Cloth  6 each Topical Daily  . Chlorhexidine Gluconate Cloth  6 each Topical Q0600  . doxazosin  2 mg Oral Daily  . enoxaparin (LOVENOX) injection  30 mg Subcutaneous QHS  . hydrALAZINE  100 mg Oral Q8H  . isosorbide mononitrate  120 mg Oral Daily  . levothyroxine  112 mcg Oral QAC breakfast  . mouth rinse  15 mL Mouth Rinse BID  . montelukast  10 mg Oral Daily  . pantoprazole  40 mg Oral Daily   Continuous Infusions: . sodium chloride    . sodium chloride    . furosemide Stopped (02/22/18 0736)  . lactated ringers 10 mL/hr at 02/22/18 0900   PRN Meds: sodium chloride, sodium chloride, ALPRAZolam, alteplase,  calcium carbonate, heparin, lidocaine (PF), lidocaine-prilocaine, metoprolol tartrate, morphine injection, ondansetron (ZOFRAN) IV, oxyCODONE, pentafluoroprop-tetrafluoroeth, sodium chloride flush, sodium chloride flush, traMADol  Allergies:    Allergies  Allergen Reactions  . Ace Inhibitors Anaphylaxis and Swelling  . Motrin Ib [Ibuprofen] Anaphylaxis    Social History:   Social History   Socioeconomic History  . Marital status: Divorced    Spouse name: Not on file  . Number of children: Not on file  . Years of education: Not on file  . Highest education level: Not on file  Occupational History  . Not on file  Social Needs  . Financial resource strain: Not on file  . Food insecurity:    Worry: Not on file    Inability: Not on file  . Transportation needs:    Medical: Not on file    Non-medical: Not on file  Tobacco Use  . Smoking status: Former Smoker    Types: Cigarettes  . Smokeless tobacco: Never Used  Substance and Sexual Activity  . Alcohol use: No  . Drug use: No    Comment: marijuana in the past  . Sexual activity: Not Currently  Lifestyle  . Physical activity:    Days per week: Not on file    Minutes per session: Not on file  . Stress: Not on file  Relationships  . Social connections:    Talks on phone: Not on file    Gets together: Not on file    Attends religious service: Not on file    Active member of club or organization: Not on file    Attends meetings of clubs or organizations: Not on file    Relationship status: Not on file  . Intimate partner violence:    Fear of  current or ex partner: Not on file    Emotionally abused: Not on file    Physically abused: Not on file    Forced sexual activity: Not on file  Other Topics Concern  . Not on file  Social History Narrative  . Not on file    Family History:   Family History  Problem Relation Age of Onset  . Hypertension Mother      ROS:  Please see the history of present illness.  All other  ROS reviewed and negative.     Physical Exam/Data:   Vitals:   02/22/18 0830 02/22/18 0845 02/22/18 0900 02/22/18 0909  BP: (!) 120/95 128/88 127/74 (!) 134/94  Pulse: 86 86 86 87  Resp: 15 19 18 17   Temp:    (!) 97.3 F (36.3 C)  TempSrc:    Oral  SpO2: 97% 97% 96% 99%  Weight:    61 kg  Height:        Intake/Output Summary (Last 24 hours) at 02/22/2018 1013 Last data filed at 02/22/2018 0909 Gross per 24 hour  Intake 1878.05 ml  Output 2944 ml  Net -1065.95 ml   Filed Weights   02/22/18 0600 02/22/18 0700 02/22/18 0909  Weight: 61.7 kg 61.7 kg 61 kg   Body mass index is 19.86 kg/m.  General:  Well nourished, well developed, appears chronically ill HEENT: normal Lymph: no adenopathy Neck: no JVD Endocrine:  No thryomegaly Vascular: No carotid bruits; FA pulses 2+ bilaterally without bruits  Cardiac:  RRR; no murmurs, gallops or rubs Lungs:  Diminished at the bases, no wheezing, rhonchi or rales  Abd: soft, nontender  Ext: no edema Musculoskeletal:  No deformities, advanced atrophy for age Skin: warm and dry  Neuro:  No gross focal abnormalities noted Psych:  Normal affect   EKG:  The EKG was personally reviewed and demonstrates:   02/16/18 pre-op is SR 69bpm, PR 161ms, QRS 121ms, QS V1-3 02/19/18 post-op: new LBBB, appears SR vs junctional, V rate 50's, PVC 02/20/18 is SR, long 1st degree AVBlock, PR 468ms, LBBB, 1:1 conduction  Telemetry:  Telemetry was personally reviewed and demonstrates:   SR/ V paced, occ PVC's Underlying looks SR long 1st degree w/Mobitz one, frequency of PVCs makes this difficult, I do think she has higher AVblock, with some suggestion of CHB  Relevant CV Studies:  Cath 07/03/2017 1. Low filling pressure and preserved cardiac output.  2. 90% ostial LCx stenosis.  3. Napkin ring-like stenosis in the proximal LAD, at least 60% stenosis.  ECHO 02/10/2018 - Left ventricle: The cavity size was mildly dilated. There was moderate  concentric hypertrophy. Systolic function was normal. The estimated ejection fraction was in the range of 55% to 60%. Wall motion was normal; there were no regional wall motion abnormalities. Features are consistent with a pseudonormal left ventricular filling pattern, with concomitant abnormal relaxation and increased filling pressure (grade 2 diastolic dysfunction). Doppler parameters are consistent with high ventricular filling pressure. - Mitral valve: Mild thickening and calcification of the anterior leaflet, consistent with rheumatic disease. Mobility was restricted. Diastolic leaflet doming was present. The findings are consistent with trivial stenosis with mean MV gradient of 13mmHg. There was moderate regurgitation. Effective regurgitant orifice (PISA): 0.17 cm^2. Regurgitant volume (PISA): 24 ml. - Left atrium: The atrium was moderately dilated. - Tricuspid valve: There was mild regurgitation. - Pulmonary arteries: PA peak pressure: 39 mm Hg (S). - Pericardium, extracardiac: A trivial pericardial effusion was identified posterior to the heart. Impressions: -  Mildly dilated LV with moderate LVF and normal LVF with EF 55-60%. There is moderate LAE, mild TR and mild pulmonary HTN with PASP 27mmHg. The MV appears rheumatic with thickening and diastolic doming. There appears to be at least moderate MR with ERO of 0.17cm2 and regurgitant volume of 24cc. The right ventricular systolic pressure was increased consistent with mild pulmonary hypertension.   TEE intraop 02/18/2018  Right ventricle: Right ventricular cavity was normal in size. There was normal RV systolic function.  Tricuspid valve: The tricuspid valve appeared structurally normal. There was trace tricuspid insufficiency.  Pulmonic valve: The pulmonic valve appeared structurally normal. There was trace pulmonic insufficiency.  Mitral valve: The mitral leaflets were thickened  but appeared to open without restriction. There was severe mitral regurgitation with a central jet. The ERO by the PISA method was 0.5 cm. The regurgitant volume was 70 cc. There were no flail or prolapsing leaflet segments. On the post bypass exam, there was a bioprosthetic valve in the mitral position. The leaflets opened normally. There was a central jet of mitral insufficiency that was mild. After careful interrogation of the sewing  Right atrium: Cavity is mildly dilated.    Laboratory Data:  Chemistry Recent Labs  Lab 02/20/18 0339 02/21/18 0355 02/22/18 0426  NA 137 134* 129*  K 4.2 3.9 3.4*  CL 102 98 93*  CO2 21* 20* 20*  GLUCOSE 187* 140* 122*  BUN 67* 84* 91*  CREATININE 5.09* 5.31* 5.53*  CALCIUM 9.1 8.4* 8.0*  GFRNONAA 8* 8* 7*  GFRAA 9* 9* 8*  ANIONGAP 14 16* 16*    Recent Labs  Lab 02/16/18 0416 02/17/18 0353  PROT  --  7.3  ALBUMIN 3.9 4.2  AST  --  16  ALT  --  15  ALKPHOS  --  96  BILITOT  --  0.9   Hematology Recent Labs  Lab 02/20/18 0339 02/21/18 0355 02/22/18 0426  WBC 18.8* 17.8* 12.5*  RBC 3.72* 3.30* 3.16*  HGB 10.2* 9.3* 8.8*  HCT 30.9* 26.9* 25.7*  MCV 83.1 81.5 81.3  MCH 27.4 28.2 27.8  MCHC 33.0 34.6 34.2  RDW 15.3 15.1 14.8  PLT 79* 89* 120*   Cardiac EnzymesNo results for input(s): TROPONINI in the last 168 hours. No results for input(s): TROPIPOC in the last 168 hours.  BNP Recent Labs  Lab 02/17/18 0353  BNP 675.7*    DDimer No results for input(s): DDIMER in the last 168 hours.  Radiology/Studies:   Dg Chest Port 1 View Result Date: 02/22/2018 CLINICAL DATA:  Follow-up pneumothorax EXAM: PORTABLE CHEST 1 VIEW COMPARISON:  02/21/2018 FINDINGS: Cardiac shadow is enlarged but stable. Postsurgical changes are again seen. Bilateral jugular catheters are again noted and stable. The right lung is well aerated with minimal basilar atelectasis. Left retrocardiac atelectasis is again noted. Left-sided apical pneumothorax is  noted slightly improved when compared with the prior exam. No bony abnormality is noted. IMPRESSION: Mild right basilar atelectasis new from the prior exam. Stable left retrocardiac atelectasis. Some improvement in left pneumothorax is noted. Electronically Signed   By: Inez Catalina M.D.   On: 02/22/2018 07:32     Assessment and Plan:   1. Heart block     Suspect higher AVBlock then Mobitz I, PVCs make it hard, though some non-paced tracings suggest CHB  She is POD#4 CABG, MVR, MAZE, PFO closure New LBBB  She may likely need PPM implant, historically placed on amiodarone for AFib stopped 2/2 junctional rhythm.  I do not see any nodal blocking/rate limiting agents this hospitalization She has IJ in place w/temp HD catheter, started on HD today (though perhaps Hansika Leaming not need past this hospital stay) and a central line in the L IJ with no peripheral access  hyponatremic  Ideally would hope temp HD can come out prior to PPM if needed, as well as central l ine Would like to give another day or so of monitoring to better get an idea of her HD needs, and potential conduction recovery.    For questions or updates, please contact [facility] Please consult [email or web address] for contact info under Cardiology/STEMI.   Signed, Baldwin Jamaica, PA-C  02/22/2018 10:13 AM  I have seen and examined this patient with Tommye Standard.  Agree with above, note added to reflect my findings.  On exam, RRR, no murmurs, lungs clear.  Patient admitted to the hospital after MVR, two-vessel CABG and Maze procedure.  She has had quite a bit of bradycardia since that time requiring ventricular pacing from her temporary wires.  She has had some intrinsic conduction with a long first-degree AV block.  Postop, her QRS has certainly widened, she has IVCD.  Unfortunately, she is a dialysis candidate and has a dialysis catheter.  She had her first run of dialysis today.  She also has a central line in her left IJ.  I would  like to see if her conduction improves to avoid pacemaker implant as she would be at a very high risk of infection.  That being said, I am concerned that she Garvey Westcott likely need a pacemaker implant in the future.  Would continue with temporary pacing at this time.  Gilverto Dileonardo M. Marvene Strohm MD 02/22/2018 2:45 PM

## 2018-02-22 NOTE — Progress Notes (Addendum)
Patient ID: Jocelyn Hill, female   DOB: [birth year], 67 y.o.   MRN: [id number]   Progress Note  Patient Name: Jocelyn Hill Date of Encounter: 02/22/2018  Primary Cardiologist: No primary care provider on file.   Subjective   She started HD this morning.  UOP actually reasonable yesterday at 2534 cc but BUN/creatinine continued to rise.   Still with chest soreness.   SBP 120s-140s but on NTG gtt at 40.   02/18/2018  Mitral Valve [facility] Mitral Bovine Bioprosthetic Tissue Valve (size62mm, model #7300TFX, serial #[id number])   Maze Procedure complete bilateral atrial lesion set using bipolar radiofrequency and cryothermy ablation clipping of left atrial appendage (Atricure Pro245 left atrial clipsize 12mm)   Coronary Artery Bypass Grafting x2 Left Internal Mammary Artery to Distal Left Anterior Descending Coronary Artery Saphenous Vein Graft to DistalLeft Circumflex Coronary Artery Endoscopic Vein Harvest from RightThigh    Closure of Patent Foramen Ovale  Inpatient Medications    Scheduled Meds: . acetaminophen  1,000 mg Oral Q6H  . amLODipine  10 mg Oral Daily  . aspirin EC  325 mg Oral Daily  . atorvastatin  80 mg Oral Daily  . buPROPion  100 mg Oral TID AC  . Chlorhexidine Gluconate Cloth  6 each Topical Daily  . Chlorhexidine Gluconate Cloth  6 each Topical Q0600  . enoxaparin (LOVENOX) injection  30 mg Subcutaneous QHS  . hydrALAZINE  100 mg Oral Q8H  . levothyroxine  112 mcg Oral QAC breakfast  . mouth rinse  15 mL Mouth Rinse BID  . montelukast  10 mg Oral Daily  . pantoprazole  40 mg Oral Daily   Continuous Infusions: . sodium chloride    . sodium chloride    . furosemide 62 mL/hr at 02/22/18 0700  . lactated ringers Stopped (02/22/18 0636)  . nitroGLYCERIN 40 mcg/min (02/22/18 0700)   PRN Meds: sodium chloride, sodium chloride, ALPRAZolam, alteplase,  calcium carbonate, heparin, lidocaine (PF), lidocaine-prilocaine, metoprolol tartrate, morphine injection, ondansetron (ZOFRAN) IV, oxyCODONE, pentafluoroprop-tetrafluoroeth, sodium chloride flush, sodium chloride flush, traMADol   Vital Signs    Vitals:   02/22/18 0600 02/22/18 0700 02/22/18 0708 02/22/18 0715  BP: 137/85 (!) 141/86 132/84 133/84  Pulse: 81 79 79 80  Resp: 12 16 17 14   Temp:  (!) 97.4 F (36.3 C)    TempSrc:  Axillary    SpO2: 97% 98%  98%  Weight: 61.7 kg 61.7 kg    Height:        Intake/Output Summary (Last 24 hours) at 02/22/2018 0725 Last data filed at 02/22/2018 0700 Gross per 24 hour  Intake 1576.87 ml  Output 2364 ml  Net -787.13 ml   Filed Weights   02/21/18 0500 02/22/18 0600 02/22/18 0700  Weight: 62.1 kg 61.7 kg 61.7 kg    Telemetry    Currently NSR with V-pacing (personally reviewed)  ECG    No new tracing, most recent tracing showed sinus rhythm with very long PR interval and occasional dropped P waves consistent with Mobitz type I second-degree AV block, LBBB (old)- Personally Reviewed  Physical Exam   General: NAD Neck: JVP 10 cm, no thyromegaly or thyroid nodule.  Lungs: Decreased BS dependently CV: Nondisplaced PMI.  Heart regular S1/S2 with widely split S2, no S3/S4, 1/6 SEM RUSB. Trace ankle edema.  Abdomen: Soft, nontender, no hepatosplenomegaly, no distention.  Skin: Intact without lesions or rashes.  Neurologic: Alert and oriented x 3.  Psych: Normal affect. Extremities: No clubbing or cyanosis.  HEENT: Normal.   Labs    Chemistry Recent Labs  Lab 02/16/18 0416 02/17/18 0353  02/20/18 0339 02/21/18 0355 02/22/18 0426  NA 134* 135   < > 137 134* 129*  K 4.2 4.2   < > 4.2 3.9 3.4*  CL 96* 96*   < > 102 98 93*  CO2 28 30   < > 21* 20* 20*  GLUCOSE 168* 136*   < > 187* 140* 122*  BUN 53* 55*   < > 67* 84* 91*  CREATININE 5.06* 5.49*   < > 5.09* 5.31* 5.53*  CALCIUM 10.1 10.1   < > 9.1 8.4* 8.0*  PROT  --  7.3  --    --   --   --   ALBUMIN 3.9 4.2  --   --   --   --   AST  --  16  --   --   --   --   ALT  --  15  --   --   --   --   ALKPHOS  --  96  --   --   --   --   BILITOT  --  0.9  --   --   --   --   GFRNONAA 8* 7*   < > 8* 8* 7*  GFRAA 9* 8*   < > 9* 9* 8*  ANIONGAP 10 9   < > 14 16* 16*   < > = values in this interval not displayed.     Hematology Recent Labs  Lab 02/20/18 0339 02/21/18 0355 02/22/18 0426  WBC 18.8* 17.8* 12.5*  RBC 3.72* 3.30* 3.16*  HGB 10.2* 9.3* 8.8*  HCT 30.9* 26.9* 25.7*  MCV 83.1 81.5 81.3  MCH 27.4 28.2 27.8  MCHC 33.0 34.6 34.2  RDW 15.3 15.1 14.8  PLT 79* 89* 120*    Cardiac Enzymes No results for input(s): TROPONINI in the last 168 hours. No results for input(s): TROPIPOC in the last 168 hours.   BNP Recent Labs  Lab 02/17/18 0353  BNP 675.7*     DDimer No results for input(s): DDIMER in the last 168 hours.   Radiology    Dg Chest 1v Repeat Same Day  Result Date: 02/21/2018 CLINICAL DATA:  Pneumothorax.  Chest tube removed yesterday. EXAM: CHEST - 1 VIEW SAME DAY COMPARISON:  One-view chest x-ray 02/21/2018 at 4:59 a.m. FINDINGS: Bilateral IJ lines are stable. Mediastinal drains have been removed. The patient is status post mitral valve replacement, coronary artery bypass grafting, and atrial clipping. Cardiac pacing wires remain in place. The left apical pneumothorax is stable to slightly smaller than on prior exam. The right lung is clear. A left pleural effusion and basilar airspace disease is again noted. IMPRESSION: 1. Stable to slight decrease in size of left apical pneumothorax. 2. Interval removal mediastinal drains. 3. Moderate left pleural effusion and basilar airspace disease. This likely reflects atelectasis. Electronically Signed   By: San Morelle M.D.   On: 02/21/2018 14:37   Dg Chest Port 1 View  Result Date: 02/21/2018 CLINICAL DATA:  Status post coronary bypass graft. EXAM: PORTABLE CHEST 1 VIEW COMPARISON:  Radiograph  February 20, 2018. FINDINGS: Stable cardiomegaly with central pulmonary vascular congestion. Stable position of left internal jugular catheter with tip in expected position of the SVC. Stable position of right internal jugular catheter with tip at expected position of cavoatrial junction. Status post coronary artery bypass graft and mitral valve repair.  Right lung is clear. Left-sided chest tube has been removed. Moderate left apical pneumothorax is noted which is significantly increased in size compared to prior exam, on the order of 30-40%. Bony thorax is unremarkable. IMPRESSION: Stable cardiomegaly with central pulmonary vascular congestion. Significantly increased left apical pneumothorax is noted status post left-sided chest tube removal. Electronically Signed   By: Marijo Conception, M.D.   On: 02/21/2018 07:31    Cardiac Studies   Cath 07/03/2017 1. Low filling pressure and preserved cardiac output.  2. 90% ostial LCx stenosis.  3. Napkin ring-like stenosis in the proximal LAD, at least 60% stenosis.   ECHO 02/10/2018 - Left ventricle: The cavity size was mildly dilated. There was moderate concentric hypertrophy. Systolic function was normal. The estimated ejection fraction was in the range of 55% to 60%. Wall motion was normal; there were no regional wall motion abnormalities. Features are consistent with a pseudonormal left ventricular filling pattern, with concomitant abnormal relaxation and increased filling pressure (grade 2 diastolic dysfunction). Doppler parameters are consistent with high ventricular filling pressure. - Mitral valve: Mild thickening and calcification of the anterior leaflet, consistent with rheumatic disease. Mobility was restricted. Diastolic leaflet doming was present. The findings are consistent with trivial stenosis with mean MV gradient of 44mmHg. There was moderate regurgitation. Effective regurgitant orifice (PISA): 0.17 cm^2.  Regurgitant volume (PISA): 24 ml. - Left atrium: The atrium was moderately dilated. - Tricuspid valve: There was mild regurgitation. - Pulmonary arteries: PA peak pressure: 39 mm Hg (S). - Pericardium, extracardiac: A trivial pericardial effusion was identified posterior to the heart.  Impressions:  - Mildly dilated LV with moderate LVF and normal LVF with EF 55-60%. There is moderate LAE, mild TR and mild pulmonary HTN with PASP 24mmHg. The MV appears rheumatic with thickening and diastolic doming. There appears to be at least moderate MR with ERO of 0.17cm2 and regurgitant volume of 24cc. The right ventricular systolic pressure was increased consistent with mild pulmonary hypertension.   TEE intraop 02/18/2018  Right ventricle: Right ventricular cavity was normal in size. There was normal RV systolic function.  Tricuspid valve: The tricuspid valve appeared structurally normal. There was trace tricuspid insufficiency.  Pulmonic valve: The pulmonic valve appeared structurally normal. There was trace pulmonic insufficiency.  Mitral valve: The mitral leaflets were thickened but appeared to open without restriction. There was severe mitral regurgitation with a central jet. The ERO by the PISA method was 0.5 cm. The regurgitant volume was 70 cc. There were no flail or prolapsing leaflet segments. On the post bypass exam, there was a bioprosthetic valve in the mitral position. The leaflets opened normally. There was a central jet of mitral insufficiency that was mild. After careful interrogation of the sewing  Right atrium: Cavity is mildly dilated.  Patient Profile     67 y.o. female with severe 2 vessel CAD, severe MR, preserved LVEF, paroxysmal atrial fibrillation, CKD stage 4, severe HTN now s/p CABG/MVR/MAZE on 08/22, had postop complete heart block, subsequently second-degree AV block Mobitz type I  Assessment & Plan    1. CAD s/p CABG: has surgical pain.  LBBB  is pre-existing. - Continue ASA and statin.  2. 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  3. Heart block: She currently is in NSR with V-pacing from pacing wires.  When pacing turned down, she remains in heart block with PVCs => type 1 2nd degree AVB possibly but question higher degree.  - Avoid nodal blockers.  - Given persistence of heart block  will ask EP to see.  Will repeat echo for EF in case permanent pacing is needed.  4. AFib s/p MAZE: no afib seen on monitor since surgery - Will need resumption of warfarin.  5. HTN: BP better but still on NTG at 40.  - Wean off NTG gtt today, start back on Imdur 120 daily (was on prior to surgery).  - Will add low dose doxasozin (was on prior to admission).  - Continue hydralazine and amlodipine.  6. Acute on chronic stage 4 renal insufficiency:   HD started this morning.  7. L apical pneumothorax: Now moderate in size after left-sided chest tube pulled out, may require replacement of the chest tube. - Per surgery.  8. Acute on chronic diastolic CHF: Weight up post-op, now volume overload on exam.  - Starting HD today for volume management but still written for IV Lasix.  - Would monitor CVP.  - Will get post-op echo given possible need for PPM placement.  9. Anemia: Post-op and renal disease.    For questions or updates, please contact [place] Please consult [email or web address] for contact info under Cardiology/STEMI.      Signed, [name], MD  02/22/2018, 7:25 AM

## 2018-02-22 NOTE — Plan of Care (Signed)
Pt currently in chair, appears asleep. PT/OT worked with pt today. Pt ambulated 25 ft. Vital signs are stable. Pt complained of pain in right arm this shift. Arm looks swollen and pt states that arm feels sore. Limited movement with extremity noted. Will pass on to rounding physician. PRN meds given as ordered. Pt still A-V paced at 80. No other complaints or changes at this time. Will continue to monitor.   Problem: Education: Goal: Knowledge of General Education information will improve Description Including pain rating scale, medication(s)/side effects and non-pharmacologic comfort measures Outcome: Progressing   Problem: Health Behavior/Discharge Planning: Goal: Ability to manage health-related needs will improve Outcome: Progressing   Problem: Clinical Measurements: Goal: Ability to maintain clinical measurements within normal limits will improve Outcome: Progressing Goal: Will remain free from infection Outcome: Progressing Goal: Diagnostic test results will improve Outcome: Progressing Goal: Respiratory complications will improve Outcome: Progressing Goal: Cardiovascular complication will be avoided Outcome: Progressing   Problem: Activity: Goal: Risk for activity intolerance will decrease Outcome: Progressing   Problem: Nutrition: Goal: Adequate nutrition will be maintained Outcome: Progressing   Problem: Coping: Goal: Level of anxiety will decrease Outcome: Progressing   Problem: Elimination: Goal: Will not experience complications related to bowel motility Outcome: Progressing Goal: Will not experience complications related to urinary retention Outcome: Progressing   Problem: Safety: Goal: Ability to remain free from injury will improve Outcome: Progressing   Problem: Skin Integrity: Goal: Risk for impaired skin integrity will decrease Outcome: Progressing   Problem: Education: Goal: Will demonstrate proper wound care and an understanding of methods to  prevent future damage Outcome: Progressing Goal: Knowledge of disease or condition will improve Outcome: Progressing Goal: Knowledge of the prescribed therapeutic regimen will improve Outcome: Progressing Goal: Individualized Educational Video(s) Outcome: Progressing   Problem: Activity: Goal: Risk for activity intolerance will decrease Outcome: Progressing   Problem: Cardiac: Goal: Will achieve and/or maintain hemodynamic stability Outcome: Progressing   Problem: Clinical Measurements: Goal: Postoperative complications will be avoided or minimized Outcome: Progressing   Problem: Respiratory: Goal: Respiratory status will improve Outcome: Progressing   Problem: Skin Integrity: Goal: Wound healing without signs and symptoms of infection Outcome: Progressing Goal: Risk for impaired skin integrity will decrease Outcome: Progressing   Problem: Urinary Elimination: Goal: Ability to achieve and maintain adequate renal perfusion and functioning will improve Outcome: Progressing

## 2018-02-22 NOTE — Progress Notes (Addendum)
[facility] DAILY ICU PROGRESS NOTE                   [address]            [place] [address]          [phone]   4 Days Post-Op Procedure(s) (LRB): MITRAL VALVE (MV) REPLACEMENT (N/A) CORONARY ARTERY BYPASS GRAFTING (CABG) WITH IMA. ENDOSCOPIC VEIN HARVEST. IMA TO LAD, SVG TO CIRC. (N/A) MAZE (N/A) TRANSESOPHAGEAL ECHOCARDIOGRAM (TEE) (N/A) PLACEMENT OF TEMPORARY HD CATHETER, POWER TRIALYSIS 13FR 20CM (N/A) PATENT FORAMEN OVALE (PFO) CLOSURE (N/A)  Total Length of Stay:  LOS: 13 days   Subjective: Shares she is having some incisional chest pain  Objective: Vital signs in last 24 hours: Temp:  [97.4 F (36.3 C)-99.1 F (37.3 C)] 98.7 F (37.1 C) (08/26 0730) Pulse Rate:  [78-87] 86 (08/26 0830) Cardiac Rhythm: Normal sinus rhythm;A-V Sequential paced (08/26 0745) Resp:  [12-35] 15 (08/26 0830) BP: (118-142)/(70-95) 120/95 (08/26 0830) SpO2:  [91 %-99 %] 97 % (08/26 0830) Weight:  [61.7 kg] 61.7 kg (08/26 0700)  Filed Weights   02/21/18 0500 02/22/18 0600 02/22/18 0700  Weight: 62.1 kg 61.7 kg 61.7 kg    Weight change: -0.408 kg   Hemodynamic parameters for last 24 hours:    Intake/Output from previous day: 08/25 0701 - 08/26 0700 In: 1576.9 [P.O.:820; I.V.:631.7; IV Piggyback:125.2] Out: 2364 [Urine:2354; Chest Tube:10]  Intake/Output this shift: Total I/O In: 414 [P.O.:360; I.V.:15.9; IV Piggyback:38.1] Out: 300 [Urine:300]  Current Meds: Scheduled Meds: . acetaminophen  1,000 mg Oral Q6H  . amLODipine  10 mg Oral Daily  . aspirin EC  325 mg Oral Daily  . atorvastatin  80 mg Oral Daily  . buPROPion  100 mg Oral TID AC  . Chlorhexidine Gluconate Cloth  6 each Topical Daily  . Chlorhexidine Gluconate Cloth  6 each Topical Q0600  . doxazosin  2 mg Oral Daily  . enoxaparin (LOVENOX) injection  30 mg Subcutaneous QHS  . hydrALAZINE  100 mg Oral Q8H  . isosorbide mononitrate  120 mg Oral Daily  . levothyroxine  112 mcg Oral QAC breakfast    . mouth rinse  15 mL Mouth Rinse BID  . montelukast  10 mg Oral Daily  . pantoprazole  40 mg Oral Daily   Continuous Infusions: . sodium chloride    . sodium chloride    . furosemide Stopped (02/22/18 0736)  . lactated ringers 10 mL/hr at 02/22/18 0800   PRN Meds:.sodium chloride, sodium chloride, ALPRAZolam, alteplase, calcium carbonate, heparin, lidocaine (PF), lidocaine-prilocaine, metoprolol tartrate, morphine injection, ondansetron (ZOFRAN) IV, oxyCODONE, pentafluoroprop-tetrafluoroeth, sodium chloride flush, sodium chloride flush, traMADol  General appearance: alert, cooperative and no distress Heart: paced rhythm Lungs: clear to auscultation bilaterally, diminished in bilateral lower lobes Abdomen: soft, non-tender; bowel sounds normal; no masses,  no organomegaly Extremities: extremities normal, atraumatic, no cyanosis or edema. Right arm with diffuse ecchymosis  Wound: clean and dry  Lab Results: CBC: Recent Labs    02/21/18 0355 02/22/18 0426  WBC 17.8* 12.5*  HGB 9.3* 8.8*  HCT 26.9* 25.7*  PLT 89* 120*   BMET:  Recent Labs    02/21/18 0355 02/22/18 0426  NA 134* 129*  K 3.9 3.4*  CL 98 93*  CO2 20* 20*  GLUCOSE 140* 122*  BUN 84* 91*  CREATININE 5.31* 5.53*  CALCIUM 8.4* 8.0*    CMET: Lab Results  Component Value Date   WBC 12.5 (H) 02/22/2018  HGB 8.8 (L) 02/22/2018   HCT 25.7 (L) 02/22/2018   PLT 120 (L) 02/22/2018   GLUCOSE 122 (H) 02/22/2018   CHOL 159 06/18/2017   TRIG 146 06/18/2017   HDL 66 06/18/2017   LDLCALC 64 06/18/2017   ALT 15 02/17/2018   AST 16 02/17/2018   NA 129 (L) 02/22/2018   K 3.4 (L) 02/22/2018   CL 93 (L) 02/22/2018   CREATININE 5.53 (H) 02/22/2018   BUN 91 (H) 02/22/2018   CO2 20 (L) 02/22/2018   TSH 2.884 10/26/2017   INR 1.15 02/22/2018   HGBA1C 6.0 (H) 02/17/2018      PT/INR:  Recent Labs    02/22/18 0426  LABPROT 14.6  INR 1.15   Radiology: Dg Chest 1v Repeat Same Day  Result Date:  02/21/2018 CLINICAL DATA:  Pneumothorax.  Chest tube removed yesterday. EXAM: CHEST - 1 VIEW SAME DAY COMPARISON:  One-view chest x-ray 02/21/2018 at 4:59 a.m. FINDINGS: Bilateral IJ lines are stable. Mediastinal drains have been removed. The patient is status post mitral valve replacement, coronary artery bypass grafting, and atrial clipping. Cardiac pacing wires remain in place. The left apical pneumothorax is stable to slightly smaller than on prior exam. The right lung is clear. A left pleural effusion and basilar airspace disease is again noted. IMPRESSION: 1. Stable to slight decrease in size of left apical pneumothorax. 2. Interval removal mediastinal drains. 3. Moderate left pleural effusion and basilar airspace disease. This likely reflects atelectasis. Electronically Signed   By: San Morelle M.D.   On: 02/21/2018 14:37   Dg Chest Port 1 View  Result Date: 02/22/2018 CLINICAL DATA:  Follow-up pneumothorax EXAM: PORTABLE CHEST 1 VIEW COMPARISON:  02/21/2018 FINDINGS: Cardiac shadow is enlarged but stable. Postsurgical changes are again seen. Bilateral jugular catheters are again noted and stable. The right lung is well aerated with minimal basilar atelectasis. Left retrocardiac atelectasis is again noted. Left-sided apical pneumothorax is noted slightly improved when compared with the prior exam. No bony abnormality is noted. IMPRESSION: Mild right basilar atelectasis new from the prior exam. Stable left retrocardiac atelectasis. Some improvement in left pneumothorax is noted. Electronically Signed   By: Inez Catalina M.D.   On: 02/22/2018 07:32     Assessment/Plan: S/P Procedure(s) (LRB): MITRAL VALVE (MV) REPLACEMENT (N/A) CORONARY ARTERY BYPASS GRAFTING (CABG) WITH IMA. ENDOSCOPIC VEIN HARVEST. IMA TO LAD, SVG TO CIRC. (N/A) MAZE (N/A) TRANSESOPHAGEAL ECHOCARDIOGRAM (TEE) (N/A) PLACEMENT OF TEMPORARY HD CATHETER, POWER TRIALYSIS 13FR 20CM (N/A) PATENT FORAMEN OVALE (PFO) CLOSURE  (N/A)  1. CV-2nd-3rd degree AVB under pacer. Currently v-paced at 72. BP with MAPs in the 90s. Cardiology following and assisting. EP consulted for PPM evaluation.  2. Pulm-CXR shows mild right basilar atelectasis new from prior exam. Stable left retrocardiac atelectasis. Some improvement in the left pneumothorax. Monitor for now 3. Renal-on HD, making some urine, creatinine 5.53, hypokalemia. Nephrology following and assisting 4. H and H stable, expected acute blood loss anemia. Thrombocytopenia improving, plateelts 120k today 5. Endo-blood glucose well controlled on current regimen 6. Holding coumadin for now, will continue with lovenox for DVT prophylaxis.  Plan: HD today, continues to make slow progress. Some improvement in left pneumothorax.     Elgie Collard 02/22/2018 8:42 AM   I have seen and examined the patient and agree with the assessment and plan as outlined.  Still pacer-dependent but HR improved.  Will hold Coumadin for now in anticipation of possible need for long-term dialysis access during this admission.  Rexene Alberts, MD 02/22/2018

## 2018-02-22 NOTE — Progress Notes (Signed)
Patient ID: Jocelyn Hill, female   DOB: [birth year], 67 y.o.   MRN: [id number] [facility] Progress Note   Assessment/ Plan:   1. 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  Undergoing hemodialysis today for clearance with minimal ultrafiltration.  Improved urine output raises some optimism that she may not need chronic hemodialysis however, determination will be made over the next several days. 2.  Hyponatremia: Secondary to impaired free water handling with acute kidney injury-monitor with hemodialysis. 3.  Hypokalemia: Secondary to ongoing diuretic use-attempt correction with dialysis and reassess need for oral potassium supplementation. 4.  Status post two-vessel coronary artery bypass grafting, MAZE procedure and mitral valve replacement on 02/18/2018 and appears to be doing well postoperatively from cardiothoracic standpoint. 5.  Anemia: Downtrending hemoglobin noted, ESA started, iron stores replete based on labs of 02/10/2018 6.  Volume/hypertension: Elevated blood pressure noted-anticipate to improve with low volume ultrafiltration with hemodialysis and improving diuresis.  Trace lower extremity edema.  Subjective:   Reports to be feeling fair with some soreness in her chest.  No acute events overnight.   Objective:   BP (!) 142/86   Pulse 83   Temp 98.7 F (37.1 C) (Oral)   Resp 14   Ht 5\' 9"  (1.753 m)   Wt 61.7 kg   SpO2 99%   BMI 20.09 kg/m   Intake/Output Summary (Last 24 hours) at 02/22/2018 0745 Last data filed at 02/22/2018 0700 Gross per 24 hour  Intake 1576.87 ml  Output 2364 ml  Net -787.13 ml   Weight change: -0.408 kg  Physical Exam: Gen: Comfortably resting in bed, being fed Jell-O.  On hemodialysis CVS: Pulse regular rhythm, normal rate, S1 and S2 without any murmurs or rubs Resp: Poor inspiratory effort, decreased breath sounds over bases, no rales/rhonchi Abd:  Soft, flat, nontender Ext: Trace ankle edema noted  Imaging: Dg Chest 1v Repeat Same Day  Result Date: 02/21/2018 CLINICAL DATA:  Pneumothorax.  Chest tube removed yesterday. EXAM: CHEST - 1 VIEW SAME DAY COMPARISON:  One-view chest x-ray 02/21/2018 at 4:59 a.m. FINDINGS: Bilateral IJ lines are stable. Mediastinal drains have been removed. The patient is status post mitral valve replacement, coronary artery bypass grafting, and atrial clipping. Cardiac pacing wires remain in place. The left apical pneumothorax is stable to slightly smaller than on prior exam. The right lung is clear. A left pleural effusion and basilar airspace disease is again noted. IMPRESSION: 1. Stable to slight decrease in size of left apical pneumothorax. 2. Interval removal mediastinal drains. 3. Moderate left pleural effusion and basilar airspace disease. This likely reflects atelectasis. Electronically Signed   By: San Morelle M.D.   On: 02/21/2018 14:37   Dg Chest Port 1 View  Result Date: 02/22/2018 CLINICAL DATA:  Follow-up pneumothorax EXAM: PORTABLE CHEST 1 VIEW COMPARISON:  02/21/2018 FINDINGS: Cardiac shadow is enlarged but stable. Postsurgical changes are again seen. Bilateral jugular catheters are again noted and stable. The right lung is well aerated with minimal basilar atelectasis. Left retrocardiac atelectasis is again noted. Left-sided apical pneumothorax is noted slightly improved when compared with the prior exam. No bony abnormality is noted. IMPRESSION: Mild right basilar atelectasis new from the prior exam. Stable left retrocardiac atelectasis. Some improvement in left pneumothorax is noted. Electronically Signed   By: Inez Catalina M.D.   On: 02/22/2018 07:32   Dg Chest Port 1 View  Result Date: 02/21/2018 CLINICAL DATA:  Status post coronary  bypass graft. EXAM: PORTABLE CHEST 1 VIEW COMPARISON:  Radiograph February 20, 2018. FINDINGS: Stable cardiomegaly with central pulmonary vascular congestion.  Stable position of left internal jugular catheter with tip in expected position of the SVC. Stable position of right internal jugular catheter with tip at expected position of cavoatrial junction. Status post coronary artery bypass graft and mitral valve repair. Right lung is clear. Left-sided chest tube has been removed. Moderate left apical pneumothorax is noted which is significantly increased in size compared to prior exam, on the order of 30-40%. Bony thorax is unremarkable. IMPRESSION: Stable cardiomegaly with central pulmonary vascular congestion. Significantly increased left apical pneumothorax is noted status post left-sided chest tube removal. Electronically Signed   By: Marijo Conception, M.D.   On: 02/21/2018 07:31    Labs: BMET Recent Labs  Lab 02/16/18 0416 02/17/18 0353  02/18/18 1320  02/18/18 1508 02/18/18 2059 02/18/18 2104 02/19/18 0341 02/19/18 1701 02/19/18 1702 02/20/18 0339 02/21/18 0355 02/22/18 0426  NA 134* 135   < > 135   < > 139 140  --  139  --  139 137 134* 129*  K 4.2 4.2   < > 4.2   < > 3.5 3.8  --  4.0  --  3.9 4.2 3.9 3.4*  CL 96* 96*   < > 100  --   --  102  --  106  --  102 102 98 93*  CO2 28 30  --   --   --   --   --   --  25  --   --  21* 20* 20*  GLUCOSE 168* 136*   < > 158*   < > 142* 115*  --  148*  --  207* 187* 140* 122*  BUN 53* 55*   < > 53*  --   --  52*  --  56*  --  53* 67* 84* 91*  CREATININE 5.06* 5.49*   < > 5.00*  --   --  4.60* 4.64* 4.40* 4.76* 5.10* 5.09* 5.31* 5.53*  CALCIUM 10.1 10.1  --   --   --   --   --   --  9.1  --   --  9.1 8.4* 8.0*  PHOS 5.1* 5.2*  --   --   --   --   --   --   --   --   --   --   --   --    < > = values in this interval not displayed.   CBC Recent Labs  Lab 02/18/18 2104  02/19/18 1701 02/19/18 1702 02/20/18 0339 02/21/18 0355 02/22/18 0426  WBC 17.7*   < > 18.1*  --  18.8* 17.8* 12.5*  NEUTROABS 16.0*  --   --   --   --   --   --   HGB 11.9*   < > 11.3* 11.6* 10.2* 9.3* 8.8*  HCT 35.6*   < >  33.8* 34.0* 30.9* 26.9* 25.7*  MCV 82.4   < > 83.0  --  83.1 81.5 81.3  PLT 141*   < > 93*  --  79* 89* 120*   < > = values in this interval not displayed.    Medications:    . acetaminophen  1,000 mg Oral Q6H  . amLODipine  10 mg Oral Daily  . aspirin EC  325 mg Oral Daily  . atorvastatin  80 mg Oral Daily  . buPROPion  100 mg Oral TID AC  . Chlorhexidine Gluconate Cloth  6 each Topical Daily  . Chlorhexidine Gluconate Cloth  6 each Topical Q0600  . doxazosin  2 mg Oral Daily  . enoxaparin (LOVENOX) injection  30 mg Subcutaneous QHS  . hydrALAZINE  100 mg Oral Q8H  . isosorbide mononitrate  120 mg Oral Daily  . levothyroxine  112 mcg Oral QAC breakfast  . mouth rinse  15 mL Mouth Rinse BID  . montelukast  10 mg Oral Daily  . pantoprazole  40 mg Oral Daily   Elmarie Shiley, MD 02/22/2018, 7:45 AM

## 2018-02-22 NOTE — Progress Notes (Signed)
Patient ID: Jocelyn Hill, female   DOB: 01-08-1951, 67 y.o.   MRN: [id number] [facility] Evening Rounds:  Hemodynamically stable in V-paced rhythm.  Sats 96%  Has had decent urine output 1180 cc since 5 am.  Had HD today.  Nurse today notes right arm more swollen, tender and decreased movement. She had a PICC on this side. Will discuss with Dr. Roxy Manns since I have not seen her before.

## 2018-02-22 NOTE — Procedures (Signed)
Patient seen on Hemodialysis. QB 250, UF goal 0.5L net Treatment adjusted as needed.  Elmarie Shiley MD [facility]. Office # [phone] Pager # [phone] 7:56 AM

## 2018-02-22 NOTE — Progress Notes (Signed)
  Echocardiogram 2D Echocardiogram has been performed.  Jocelyn Hill 02/22/2018, 10:28 AM

## 2018-02-22 NOTE — Progress Notes (Signed)
Physical Therapy Treatment Patient Details Name: Jocelyn Hill MRN: [id number] DOB: Jul 08, 1950 Today's Date: 02/22/2018    History of Present Illness 67 yo admitted from heart failure clinic with chest pain and SOB. Pt with planned CABG/MVR/MAze for 8/29 but moved up to 8/22 PMHx: h/o mitral regurgitation, chronic diastolic CHF, CAD s/p MI, HTN, DM2, CKD IV-V, h/o Stroke, anxiety, depression and GERD    PT Comments    Pt in bed visiting with daughter on arrival. Pt vitals on arrival in bed w/ HOB elevated HR: 84, SpO2 93% on 2L [place], RR 17, BP 143/87 (104). Pt completed session on RA with SpO2 staying at 92%, put back on 1L [place] with nurse aware. VSS throughout. Pt minA +1 to transfer to bedside commode and ambulate 33ft w/ HHA. Sternal precautions reviewed w/ pt and daughter. Pt w/ bruising and tenderness to touch on R UE. Pt  Seen post-sx with goals and discharge recommendations updated.    Follow Up Recommendations  Home health PT;Supervision/Assistance - 24 hour     Equipment Recommendations  None recommended by PT    Recommendations for Other Services OT consult     Precautions / Restrictions Precautions Precautions: Fall;Sternal Precaution Booklet Issued: Yes (comment)    Mobility  Bed Mobility Overal bed mobility: Needs Assistance Bed Mobility: Rolling;Sidelying to Sit Rolling: Mod assist Sidelying to sit: Min assist       General bed mobility comments: mod assist with pad to rotate pelvis and bring legs off of bed, min assist with LUE to rise  Transfers Overall transfer level: Needs assistance   Transfers: Sit to/from Stand Sit to Stand: Min assist         General transfer comment: LUE assist with cues for hand placement and anterior lean to rise from bed, pivot to [facility] with LUE HHA and then rise from [facility]  Ambulation/Gait Ambulation/Gait assistance: Min assist Gait Distance (Feet): 25 Feet Assistive device: 1 person hand held assist Gait Pattern/deviations:  Step-through pattern;Decreased stride length   Gait velocity interpretation: <1.8 ft/sec, indicate of risk for recurrent falls General Gait Details: LUe support throughout gait with cues for increased distance, posture and looking up. Pt on RA with SpO2 90-94% return to 1L end of session at 94%   Stairs             Wheelchair Mobility    Modified Rankin (Stroke Patients Only)       Balance Overall balance assessment: Needs assistance   Sitting balance-Leahy Scale: Fair     Standing balance support: Single extremity supported Standing balance-Leahy Scale: Poor Standing balance comment: requires L UE hha                             Cognition Arousal/Alertness: Awake/alert Behavior During Therapy: Flat affect Overall Cognitive Status: Within Functional Limits for tasks assessed                                        Exercises      General Comments General comments (skin integrity, edema, etc.): VSS      Pertinent Vitals/Pain Pain Score: 7  Pain Location: RUE and chest Pain Descriptors / Indicators: Aching;Sore Pain Intervention(s): Limited activity within patient's tolerance;Repositioned;Monitored during session    Home Living  Prior Function            PT Goals (current goals can now be found in the care plan section) Progress towards PT goals: Progressing toward goals    Frequency    Min 3X/week      PT Plan Current plan remains appropriate    Co-evaluation              AM-PAC PT "6 Clicks" Daily Activity  Outcome Measure  Difficulty turning over in bed (including adjusting bedclothes, sheets and blankets)?: Unable Difficulty moving from lying on back to sitting on the side of the bed? : Unable Difficulty sitting down on and standing up from a chair with arms (e.g., wheelchair, bedside commode, etc,.)?: Unable Help needed moving to and from a bed to chair (including a  wheelchair)?: A Little Help needed walking in hospital room?: A Lot Help needed climbing 3-5 steps with a railing? : A Lot 6 Click Score: 10    End of Session Equipment Utilized During Treatment: Gait belt Activity Tolerance: Patient limited by fatigue;Patient limited by pain Patient left: in bed;with call bell/phone within reach;with nursing/sitter in room Nurse Communication: Mobility status PT Visit Diagnosis: Other abnormalities of gait and mobility (R26.89);Muscle weakness (generalized) (M62.81)     Time: 4492-0100 PT Time Calculation (min) (ACUTE ONLY): 40 min  Charges:  $Gait Training: 8-22 mins $Therapeutic Activity: 8-22 mins                     Samuella Bruin, [place]  Acute Rehab [phone]    Samuella Bruin 02/22/2018, 11:57 AM

## 2018-02-22 NOTE — Evaluation (Signed)
Occupational Therapy Re-evaluation Patient Details Name: Jocelyn Hill MRN: [id number] DOB: [birth year] Today's Date: 02/22/2018    History of Present Illness 67 yo admitted from heart failure clinic with chest pain and SOB. Pt with planned CABG/MVR/MAze for 8/29 but moved up to 8/22 PMHx: h/o mitral regurgitation, chronic diastolic CHF, CAD s/p MI, HTN, DM2, CKD IV-V, h/o Stroke, anxiety, depression and GERD   Clinical Impression   Pt presenting with decreased balance, strength, and activity tolerance post-surgery. Pt also presenting with decreased functional use of RUE (dominant hand) due to edema and pain with movement. Pt requiring Min A for UB ADLs, Mod A for LB ADLs, and Min A for functional transfers. PTA, pt was independent and living alone; continues to reports that her daughter plans to stay at [place]. SpO2 staying in 90s on RA. Pt would benefit from further acute OT to facilitate safe dc. Recommend dc to home with HHOT for further OT to optimize safety, independence with ADLs, and return to PLOF.      Follow Up Recommendations  Home health OT;Supervision/Assistance - 24 hour    Equipment Recommendations  3 in 1 bedside commode    Recommendations for Other Services PT consult     Precautions / Restrictions Precautions Precautions: Fall;Sternal Precaution Booklet Issued: Yes (comment) Precaution Comments: watch O2 Restrictions Weight Bearing Restrictions: Yes(sternal precautions)      Mobility Bed Mobility Overal bed mobility: Needs Assistance Bed Mobility: Rolling;Sidelying to Sit Rolling: Min assist Sidelying to sit: Min assist       General bed mobility comments: Min A for facilitating trunk towards left side. Min A to elevate trunk. Pt able to bring BLEs off EOB  Transfers Overall transfer level: Needs assistance Equipment used: None Transfers: Sit to/from [facility] Sit to Stand: Min assist Stand pivot transfers: Min assist       General  transfer comment: Min A for power up and present posterior lean.     Balance Overall balance assessment: Needs assistance Sitting-balance support: No upper extremity supported;Feet supported Sitting balance-Leahy Scale: Fair     Standing balance support: Single extremity supported Standing balance-Leahy Scale: Poor Standing balance comment: Requiring Min A                           ADL either performed or assessed with clinical judgement   ADL Overall ADL's : Needs assistance/impaired Eating/Feeding: Minimal assistance;Sitting Eating/Feeding Details (indicate cue type and reason): Bilateral coorindation tasks Grooming: Minimal assistance;Sitting Grooming Details (indicate cue type and reason): Min A for bilateral cooridnation Upper Body Bathing: Minimal assistance;Sitting   Lower Body Bathing: Sit to/from stand;Minimal assistance   Upper Body Dressing : Minimal assistance;Sitting Upper Body Dressing Details (indicate cue type and reason): Min A for balance Lower Body Dressing: Moderate assistance;Minimal assistance;Sit to/from stand Lower Body Dressing Details (indicate cue type and reason): Mod A for LB dressing with decreased ROM and balance. Pt almost able to bring ankles to knees. Pt fatigued quickly. Min A for dyanmic standing balance  Toilet Transfer: Minimal assistance;Stand-pivot(Simulated to recliner) Toilet Transfer Details (indicate cue type and reason): Min A for balance         Functional mobility during ADLs: Minimal assistance(stand pivot only due to fatigue) General ADL Comments: Pt with decreased strength, balance, and activity tolerance. Pt signfiicantly fatigued. Pt also presenting with decreased funcitonal use of RUE (dominant hand).      Vision  Perception     Praxis      Pertinent Vitals/Pain Pain Assessment: 0-10 Pain Score: 7  Pain Location: RUE and chest Pain Descriptors / Indicators: Aching;Sore Pain Intervention(s):  Monitored during session;Limited activity within patient's tolerance;Repositioned;Patient requesting pain meds-RN notified     Hand Dominance Right   Extremity/Trunk Assessment Upper Extremity Assessment Upper Extremity Assessment: RUE deficits/detail RUE Deficits / Details: Decreased ROM with pain during movement. Pt able to perform finger opposition with increased time. Poor wrist ROM. Able to bring hand to mouth with significant time and reports pain. Reports painful when touched.  RUE Coordination: decreased fine motor;decreased gross motor   Lower Extremity Assessment Lower Extremity Assessment: Generalized weakness   Cervical / Trunk Assessment Cervical / Trunk Assessment: Normal   Communication Communication Communication: No difficulties   Cognition Arousal/Alertness: Awake/alert Behavior During Therapy: Flat affect Overall Cognitive Status: Within Functional Limits for tasks assessed                                     General Comments  VSS. SpO2 90s on RA    Exercises Exercises: Other exercises Other Exercises Other Exercises: Educating pt to elevate RUE for edema management   Shoulder Instructions      Home Living Family/patient expects to be discharged to:: Private residence Living Arrangements: Alone Available Help at Discharge: Family;Available 24 hours/day Type of Home: Apartment Home Access: Level entry     Home Layout: One level     Bathroom Shower/Tub: Teacher, early years/pre: Standard     Home Equipment: Environmental consultant - 2 wheels   Additional Comments: Pt reporting her daughter is able to stay at [employer]      Prior Functioning/Environment Level of Independence: Needs assistance  Gait / Transfers Assistance Needed: walks without AD ADL's / Homemaking Assistance Needed: daughter does the cooking, cleaning, grocery shopping   Comments: daughter or grandkids can stay with pt. Drives short distances        OT Problem List:  Decreased activity tolerance;Impaired balance (sitting and/or standing);Decreased strength;Decreased coordination;Decreased knowledge of use of DME or AE;Decreased knowledge of precautions;Pain      OT Treatment/Interventions: Self-care/ADL training;Therapeutic exercise;Energy conservation;DME and/or AE instruction;Therapeutic activities;Patient/family education    OT Goals(Current goals can be found in the care plan section) Acute Rehab OT Goals Patient Stated Goal: feel better OT Goal Formulation: With patient Time For Goal Achievement: 03/01/18 Potential to Achieve Goals: Good  OT Frequency: Min 2X/week   Barriers to D/C:            Co-evaluation              AM-PAC PT "6 Clicks" Daily Activity     Outcome Measure Help from another person eating meals?: None Help from another person taking care of personal grooming?: A Little Help from another person toileting, which includes using toliet, bedpan, or urinal?: A Little Help from another person bathing (including washing, rinsing, drying)?: A Little Help from another person to put on and taking off regular upper body clothing?: A Little Help from another person to put on and taking off regular lower body clothing?: A Little 6 Click Score: 19   End of Session Nurse Communication: Mobility status  Activity Tolerance: Patient limited by fatigue Patient left: in chair;with call bell/phone within reach;with nursing/sitter in room  OT Visit Diagnosis: Unsteadiness on feet (R26.81);Other abnormalities of gait and mobility (R26.89);Muscle weakness (generalized) (  M62.81);Pain Pain - part of body: (Chest)                Time: 1224-8250 OT Time Calculation (min): 27 min Charges:  OT General Charges $OT Visit: 1 Visit OT Evaluation $OT Re-eval: 1 Re-eval OT Treatments $Self Care/Home Management : 8-22 mins  Ersel Wadleigh MSOT, OTR/L Acute Rehab Pager: [phone] Office: [place] 02/22/2018, 5:32  PM

## 2018-02-23 ENCOUNTER — Other Ambulatory Visit (HOSPITAL_COMMUNITY): Payer: Self-pay

## 2018-02-23 ENCOUNTER — Encounter (HOSPITAL_COMMUNITY): Payer: Self-pay

## 2018-02-23 ENCOUNTER — Inpatient Hospital Stay (HOSPITAL_COMMUNITY): Payer: Medicare Other

## 2018-02-23 DIAGNOSIS — R609 Edema, unspecified: Secondary | ICD-10-CM

## 2018-02-23 DIAGNOSIS — M79609 Pain in unspecified limb: Secondary | ICD-10-CM

## 2018-02-23 DIAGNOSIS — M7989 Other specified soft tissue disorders: Secondary | ICD-10-CM

## 2018-02-23 LAB — BASIC METABOLIC PANEL
Anion gap: 12 (ref 5–15)
BUN: 57 mg/dL — ABNORMAL HIGH (ref 8–23)
CO2: 24 mmol/L (ref 22–32)
Calcium: 8.1 mg/dL — ABNORMAL LOW (ref 8.9–10.3)
Chloride: 96 mmol/L — ABNORMAL LOW (ref 98–111)
Creatinine, Ser: 4.01 mg/dL — ABNORMAL HIGH (ref 0.44–1.00)
GFR calc Af Amer: 12 mL/min — ABNORMAL LOW (ref >60)
GFR calc non Af Amer: 11 mL/min — ABNORMAL LOW (ref >60)
Glucose, Bld: 244 mg/dL — ABNORMAL HIGH (ref 70–99)
Potassium: 3.5 mmol/L (ref 3.5–5.1)
Sodium: 132 mmol/L — ABNORMAL LOW (ref 135–145)

## 2018-02-23 LAB — PROTIME-INR
INR: 1.1
Prothrombin Time: 14.1 seconds (ref 11.4–15.2)

## 2018-02-23 MED ORDER — DOXAZOSIN MESYLATE 2 MG PO TABS
4.0000 mg | ORAL_TABLET | Freq: Every day | ORAL | Status: DC
  Administered 2018-02-23 – 2018-02-25 (×3): 4 mg via ORAL
  Filled 2018-02-23: qty 1
  Filled 2018-02-23: qty 2
  Filled 2018-02-23: qty 1

## 2018-02-23 MED ORDER — BOOST / RESOURCE BREEZE PO LIQD CUSTOM
1.0000 | Freq: Three times a day (TID) | ORAL | Status: DC
  Administered 2018-02-23 – 2018-03-05 (×23): 1 via ORAL

## 2018-02-23 NOTE — Progress Notes (Addendum)
Nutrition Follow-up  DOCUMENTATION CODES:   Non-severe (moderate) malnutrition in context of chronic illness, Underweight  INTERVENTION:   - Boost Breeze po TID, each supplement provides 250 kcal and 9 grams of protein  - Recommend MVI with minerals daily  - Recommend advancing diet as soon as is possible (per diet order on 8/23) pt is at risk for developing severe protein-calorie malnutrition related to inadequate PO intake (NPO or clear liquids) since 8/22.  NUTRITION DIAGNOSIS:   Moderate Malnutrition related to chronic illness (CHF) as evidenced by percent weight loss, energy intake < 75% for > or equal to 1 month, mild fat depletion, moderate fat depletion, mild muscle depletion, moderate muscle depletion.  Ongoing  GOAL:   Patient will meet greater than or equal to 90% of their needs  Unmet  MONITOR:   PO intake, Supplement acceptance, Labs, Weight trends, Skin, I & O's  REASON FOR ASSESSMENT:   Consult Assessment of nutrition requirement/status  ASSESSMENT:   Jocelyn Hill is a 67 y.o. female with h/o mitral regurgitation, chronic diastolic CHF, CAD s/p MI, long standing HTN, DM2, CKD IV-V, h/o Stroke. Anxiety, depression and GERD. Patient reported that she has had 2 MIs treated at [facility] in [place] several years ago.  Pt admitted with CHF.  8/22 - s/p CABG x 2 with MVR and MAZE, s/p closure of patent foramen ovale, s/p placement of temporary HD cath 8/23 - extubated s/p procedure, found to have small L pneumothorax 8/26 - first HD  Nephrology following to determine whether chronic HD will be needed.  Discussed pt with RN. Per RN, able to advance pt's diet as tolerated. RN suspects pt will not tolerate a solid diet at this time due to persistent nausea.  Spoke with pt at bedside who reports that she is not doing well. Pt states she has no appetite and that anything she drinks "goes right through me."  Pt is willing to try Boost Breeze to  maximize kcal and protein intake as clear liquid diet is extremely low in protein and kcals. RD to order.  Meal Completion: 40-100% since 8/25 (per chart, clear liquid diet or NPO since 8/22)  Medications reviewed and include: 112 mcg levothyroxine daily, 40 mg Protonix daily, 120 mg IV Lasix BID, lactated ringers, 10 mEq KCl PRN  Labs reviewed: sodium 132 (L), BUN 57 (H), creatinine 4.01 (H), hemoglobin 8.8 (L), HCT 25.7 (L)  UOP: 3000 ml x 24 hours (2.1 ml/kg/hr) Net UF from dialysis on 8/26: 500 ml  Post-dialysis weight: 61 kg  Diet Order:   Diet Order            Diet clear liquid Room service appropriate? Yes; Fluid consistency: Thin  Diet effective now              EDUCATION NEEDS:   Education needs have been addressed  Skin:  Skin Assessment: Skin Integrity Issues: Incisions: closed incisions to R leg and chest  Last BM:  02/23/18 - medium type 4  Height:   Ht Readings from Last 1 Encounters:  02/20/18 5\' 9"  (1.753 m)    Weight:   Wt Readings from Last 1 Encounters:  02/23/18 60.9 kg    Ideal Body Weight:  65.9 kg  BMI:  Body mass index is 19.83 kg/m.  Estimated Nutritional Needs:   Kcal:  3474-2595  Protein:  95-110 grams  Fluid:  1.7-1.9 L    Gaynell Face, MS, RD, LDN Pager: [phone] Weekend/After Hours: [phone]

## 2018-02-23 NOTE — Progress Notes (Signed)
Patient ID: Jocelyn Hill, female   DOB: July 13, 1950, 67 y.o.   MRN: [id number] [facility] Progress Note   Assessment/ Plan:   1. 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  Labs this morning indicative of dialysis yesterday.  No acute indications noted for hemodialysis at this time-we will continue to follow as she remains at high risk for chronic/maintenance hemodialysis. 2.  Hyponatremia: Secondary to impaired free water handling with acute kidney injury-improved with hemodialysis/urine output overnight. 3.  Right arm swelling: With prior history of PICC line on the side-likely will need ultrasound to rule out DVT. 4.  Status post two-vessel coronary artery bypass grafting, MAZE procedure and mitral valve replacement on 02/18/2018 and appears to be doing well postoperatively from cardiothoracic standpoint. 5.  Anemia: Downtrending hemoglobin noted, ESA started, iron stores replete based on labs of 02/10/2018 6.  Volume/hypertension: Elevated blood pressure noted-anticipate to improve with low volume ultrafiltration with hemodialysis and improving diuresis.  Improving volume status  Subjective:   Reports right arm pain/swelling and intermittent difficulty with shortness of breath due to inability to take a full breath (chest wall/wound pain).   Objective:   BP (!) 166/88 (BP Location: Left Arm)   Pulse 87   Temp 98.7 F (37.1 C) (Oral)   Resp 15   Ht 5\' 9"  (1.753 m)   Wt 60.9 kg   SpO2 98%   BMI 19.83 kg/m   Intake/Output Summary (Last 24 hours) at 02/23/2018 0752 Last data filed at 02/23/2018 4431 Gross per 24 hour  Intake 768.46 ml  Output 3275 ml  Net -2506.54 ml   Weight change: -0.689 kg  Physical Exam: Gen: Comfortably resting in recliner, nurse at bedside CVS: Pulse regular rhythm, normal rate, S1 and S2 without any murmurs or rubs Resp: Poor inspiratory effort, decreased breath  sounds over bases, no rales/rhonchi Abd: Soft, flat, nontender Ext: No pretibial edema appreciated.  Imaging: Dg Chest 1v Repeat Same Day  Result Date: 02/21/2018 CLINICAL DATA:  Pneumothorax.  Chest tube removed yesterday. EXAM: CHEST - 1 VIEW SAME DAY COMPARISON:  One-view chest x-ray 02/21/2018 at 4:59 a.m. FINDINGS: Bilateral IJ lines are stable. Mediastinal drains have been removed. The patient is status post mitral valve replacement, coronary artery bypass grafting, and atrial clipping. Cardiac pacing wires remain in place. The left apical pneumothorax is stable to slightly smaller than on prior exam. The right lung is clear. A left pleural effusion and basilar airspace disease is again noted. IMPRESSION: 1. Stable to slight decrease in size of left apical pneumothorax. 2. Interval removal mediastinal drains. 3. Moderate left pleural effusion and basilar airspace disease. This likely reflects atelectasis. Electronically Signed   By: San Morelle M.D.   On: 02/21/2018 14:37   Dg Chest Port 1 View  Result Date: 02/22/2018 CLINICAL DATA:  Follow-up pneumothorax EXAM: PORTABLE CHEST 1 VIEW COMPARISON:  02/21/2018 FINDINGS: Cardiac shadow is enlarged but stable. Postsurgical changes are again seen. Bilateral jugular catheters are again noted and stable. The right lung is well aerated with minimal basilar atelectasis. Left retrocardiac atelectasis is again noted. Left-sided apical pneumothorax is noted slightly improved when compared with the prior exam. No bony abnormality is noted. IMPRESSION: Mild right basilar atelectasis new from the prior exam. Stable left retrocardiac atelectasis. Some improvement in left pneumothorax is noted. Electronically Signed   By: Inez Catalina M.D.   On: 02/22/2018 07:32    Labs: BMET Recent Labs  Lab 02/17/18 0353  02/18/18 2059  02/19/18 0341 02/19/18 1701 02/19/18 1702 02/20/18 0339 02/21/18 0355 02/22/18 0426 02/23/18 0446  NA 135   < > 140  --   139  --  139 137 134* 129* 132*  K 4.2   < > 3.8  --  4.0  --  3.9 4.2 3.9 3.4* 3.5  CL 96*   < > 102  --  106  --  102 102 98 93* 96*  CO2 30  --   --   --  25  --   --  21* 20* 20* 24  GLUCOSE 136*   < > 115*  --  148*  --  207* 187* 140* 122* 244*  BUN 55*   < > 52*  --  56*  --  53* 67* 84* 91* 57*  CREATININE 5.49*   < > 4.60*   < > 4.40* 4.76* 5.10* 5.09* 5.31* 5.53* 4.01*  CALCIUM 10.1  --   --   --  9.1  --   --  9.1 8.4* 8.0* 8.1*  PHOS 5.2*  --   --   --   --   --   --   --   --   --   --    < > = values in this interval not displayed.   CBC Recent Labs  Lab 02/18/18 2104  02/19/18 1701 02/19/18 1702 02/20/18 0339 02/21/18 0355 02/22/18 0426  WBC 17.7*   < > 18.1*  --  18.8* 17.8* 12.5*  NEUTROABS 16.0*  --   --   --   --   --   --   HGB 11.9*   < > 11.3* 11.6* 10.2* 9.3* 8.8*  HCT 35.6*   < > 33.8* 34.0* 30.9* 26.9* 25.7*  MCV 82.4   < > 83.0  --  83.1 81.5 81.3  PLT 141*   < > 93*  --  79* 89* 120*   < > = values in this interval not displayed.    Medications:    . acetaminophen  1,000 mg Oral Q6H  . amLODipine  10 mg Oral Daily  . aspirin EC  325 mg Oral Daily  . atorvastatin  80 mg Oral Daily  . buPROPion  100 mg Oral TID AC  . Chlorhexidine Gluconate Cloth  6 each Topical Daily  . Chlorhexidine Gluconate Cloth  6 each Topical Q0600  . doxazosin  4 mg Oral Daily  . enoxaparin (LOVENOX) injection  30 mg Subcutaneous QHS  . hydrALAZINE  100 mg Oral Q8H  . isosorbide mononitrate  120 mg Oral Daily  . levothyroxine  112 mcg Oral QAC breakfast  . mouth rinse  15 mL Mouth Rinse BID  . montelukast  10 mg Oral Daily  . pantoprazole  40 mg Oral Daily   Elmarie Shiley, MD 02/23/2018, 7:52 AM

## 2018-02-23 NOTE — Progress Notes (Addendum)
Progress Note  Patient Name: Jocelyn Hill Date of Encounter: 02/23/2018  Primary Cardiologist: Dr. Aundra Dubin  Subjective   C/o R arm pain, worse last night, less swollen then lest evening, tired, no CP or SOB  Inpatient Medications    Scheduled Meds: . acetaminophen  1,000 mg Oral Q6H  . amLODipine  10 mg Oral Daily  . aspirin EC  325 mg Oral Daily  . atorvastatin  80 mg Oral Daily  . buPROPion  100 mg Oral TID AC  . Chlorhexidine Gluconate Cloth  6 each Topical Daily  . Chlorhexidine Gluconate Cloth  6 each Topical Q0600  . doxazosin  4 mg Oral Daily  . enoxaparin (LOVENOX) injection  30 mg Subcutaneous QHS  . hydrALAZINE  100 mg Oral Q8H  . isosorbide mononitrate  120 mg Oral Daily  . levothyroxine  112 mcg Oral QAC breakfast  . mouth rinse  15 mL Mouth Rinse BID  . montelukast  10 mg Oral Daily  . pantoprazole  40 mg Oral Daily   Continuous Infusions: . sodium chloride    . sodium chloride    . furosemide Stopped (02/23/18 0734)  . lactated ringers Stopped (02/23/18 0738)  . potassium chloride 10 mEq (02/23/18 0738)   PRN Meds: sodium chloride, sodium chloride, ALPRAZolam, alteplase, calcium carbonate, heparin, lidocaine (PF), lidocaine-prilocaine, metoprolol tartrate, morphine injection, ondansetron (ZOFRAN) IV, oxyCODONE, pentafluoroprop-tetrafluoroeth, potassium chloride, sodium chloride flush, sodium chloride flush, traMADol   Vital Signs    Vitals:   02/23/18 0628 02/23/18 0700 02/23/18 0727 02/23/18 0749  BP: (!) 141/97 (!) 165/90 (!) 166/88   Pulse:  97  87  Resp:  (!) 22  15  Temp:   98.7 F (37.1 C)   TempSrc:   Oral   SpO2:  94%  98%  Weight:  60.9 kg    Height:        Intake/Output Summary (Last 24 hours) at 02/23/2018 0854 Last data filed at 02/23/2018 0738 Gross per 24 hour  Intake 714.49 ml  Output 3275 ml  Net -2560.51 ml   Filed Weights   02/22/18 0700 02/22/18 0909 02/23/18 0700  Weight: 61.7 kg 61 kg 60.9 kg    Telemetry    SR/  V paced, >>> underlying remains CHB, V rates 50's - Personally Reviewed  ECG    No new EKGs - Personally Reviewed  Physical Exam   GEN: No acute distress.   Neck: No JVD, R IJ temp HD catheter, L IJ central line Cardiac: RRR, no murmurs, rubs, or gallops.  Respiratory: Clear to auscultation bilaterally. GI: Soft, nontender, non-distended  MS: No edema; No deformity.  I do not see any obvious deformity/swelling to RUE Neuro:  Nonfocal  Psych: Normal affect   Labs    Chemistry Recent Labs  Lab 02/17/18 0353  02/21/18 0355 02/22/18 0426 02/23/18 0446  NA 135   < > 134* 129* 132*  K 4.2   < > 3.9 3.4* 3.5  CL 96*   < > 98 93* 96*  CO2 30   < > 20* 20* 24  GLUCOSE 136*   < > 140* 122* 244*  BUN 55*   < > 84* 91* 57*  CREATININE 5.49*   < > 5.31* 5.53* 4.01*  CALCIUM 10.1   < > 8.4* 8.0* 8.1*  PROT 7.3  --   --   --   --   ALBUMIN 4.2  --   --   --   --  AST 16  --   --   --   --   ALT 15  --   --   --   --   ALKPHOS 96  --   --   --   --   BILITOT 0.9  --   --   --   --   GFRNONAA 7*   < > 8* 7* 11*  GFRAA 8*   < > 9* 8* 12*  ANIONGAP 9   < > 16* 16* 12   < > = values in this interval not displayed.     Hematology Recent Labs  Lab 02/20/18 0339 02/21/18 0355 02/22/18 0426  WBC 18.8* 17.8* 12.5*  RBC 3.72* 3.30* 3.16*  HGB 10.2* 9.3* 8.8*  HCT 30.9* 26.9* 25.7*  MCV 83.1 81.5 81.3  MCH 27.4 28.2 27.8  MCHC 33.0 34.6 34.2  RDW 15.3 15.1 14.8  PLT 79* 89* 120*    Cardiac EnzymesNo results for input(s): TROPONINI in the last 168 hours. No results for input(s): TROPIPOC in the last 168 hours.   BNP Recent Labs  Lab 02/17/18 0353  BNP 675.7*     DDimer No results for input(s): DDIMER in the last 168 hours.   Radiology    Dg Chest Port 1 View Result Date: 02/22/2018 CLINICAL DATA:  Follow-up pneumothorax EXAM: PORTABLE CHEST 1 VIEW COMPARISON:  02/21/2018 FINDINGS: Cardiac shadow is enlarged but stable. Postsurgical changes are again seen. Bilateral  jugular catheters are again noted and stable. The right lung is well aerated with minimal basilar atelectasis. Left retrocardiac atelectasis is again noted. Left-sided apical pneumothorax is noted slightly improved when compared with the prior exam. No bony abnormality is noted. IMPRESSION: Mild right basilar atelectasis new from the prior exam. Stable left retrocardiac atelectasis. Some improvement in left pneumothorax is noted. Electronically Signed   By: Inez Catalina M.D.   On: 02/22/2018 07:32    Cardiac Studies   02/22/18: TTE Study Conclusions - Left ventricle: The cavity size was normal. Wall thickness was   increased in a pattern of mild LVH. Global hypokinesis with   incoordinate septal motion. Systolic function was mildly reduced.   The estimated ejection fraction was in the range of 45% to 50%.   The study is not technically sufficient to allow evaluation of LV   diastolic function. - Mitral valve: Bioprosthetic MVR. No obstruction. Mobile   subvalvular density - may be ruptured cord or less likely   thrombus. Mean gradient (D): 3 mm Hg. Valve area by continuity   equation (using LVOT flow): 1.87 cm^2. - Left atrium: Severely dilated. - Right ventricle: The cavity size was mildly dilated. Mildly   reduced systolic function. - Right atrium: Moderately dilated. - Atrial septum: No defect or patent foramen ovale was identified.   s/p PFO closure. - Tricuspid valve: There was moderate regurgitation. - Pulmonary arteries: PA peak pressure: 32 mm Hg (S). - Pericardium, extracardiac: A trivial pericardial effusion was   identified posterior to the heart. Features were not consistent   with tamponade physiology. There was a left pleural effusion. Impressions: - Compared to a prior study in 01/2018, the LVEF is lower at 45-50%   with global hypokinesis and incoordinate (post-operative) septal   motion. There has been interval placment of a bioprosthetic MV   with normal gradients.  There is mobile material below the valve   which may be flail cord. PFO closure is noted without residual   color doppler flow. A  trivial persistent posterior pericardial   effusion is noted.    Cath 07/03/2017 1. Low filling pressure and preserved cardiac output.  2. 90% ostial LCx stenosis.  3. Napkin ring-like stenosis in the proximal LAD, at least 60% stenosis.  ECHO 02/10/2018 - Left ventricle: The cavity size was mildly dilated. There was moderate concentric hypertrophy. Systolic function was normal. The estimated ejection fraction was in the range of 55% to 60%. Wall motion was normal; there were no regional wall motion abnormalities. Features are consistent with a pseudonormal left ventricular filling pattern, with concomitant abnormal relaxation and increased filling pressure (grade 2 diastolic dysfunction). Doppler parameters are consistent with high ventricular filling pressure. - Mitral valve: Mild thickening and calcification of the anterior leaflet, consistent with rheumatic disease. Mobility was restricted. Diastolic leaflet doming was present. The findings are consistent with trivial stenosis with mean MV gradient of 66mmHg. There was moderate regurgitation. Effective regurgitant orifice (PISA): 0.17 cm^2. Regurgitant volume (PISA): 24 ml. - Left atrium: The atrium was moderately dilated. - Tricuspid valve: There was mild regurgitation. - Pulmonary arteries: PA peak pressure: 39 mm Hg (S). - Pericardium, extracardiac: A trivial pericardial effusion was identified posterior to the heart. Impressions: - Mildly dilated LV with moderate LVF and normal LVF with EF 55-60%. There is moderate LAE, mild TR and mild pulmonary HTN with PASP 76mmHg. The MV appears rheumatic with thickening and diastolic doming. There appears to be at least moderate MR with ERO of 0.17cm2 and regurgitant volume of 24cc. The right ventricular systolic  pressure was increased consistent with mild pulmonary hypertension.   TEE intraop 02/18/2018  Right ventricle: Right ventricular cavity was normal in size. There was normal RV systolic function.  Tricuspid valve: The tricuspid valve appeared structurally normal. There was trace tricuspid insufficiency.  Pulmonic valve: The pulmonic valve appeared structurally normal. There was trace pulmonic insufficiency.  Mitral valve: The mitral leaflets were thickened but appeared to open without restriction. There was severe mitral regurgitation with a central jet. The ERO by the PISA method was 0.5 cm. The regurgitant volume was 70 cc. There were no flail or prolapsing leaflet segments. On the post bypass exam, there was a bioprosthetic valve in the mitral position. The leaflets opened normally. There was a central jet of mitral insufficiency that was mild. After careful interrogation of the sewing  Right atrium: Cavity is mildly dilated.  Patient Profile     67 y.o. 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, PAF, CKD (IV-V) admitted 02/09/18 with escalating anginal symptoms >>>  02/18/18:  CABG,Left Internal Mammary Artery to Distal Left Anterior Descending Coronary Artery Saphenous Vein Graft to DistalLeft Circumflex Coronary Artery  MVR [facility] Mitral Bovine Bioprosthetic Tissue Valve (size73mm, model #7300TFX, serial #[id number]) MAZE PFO closure Placement of Right Internal Jugular Tridialysis temporary hemodialysis catheter ---- Extubated 02/19/18, small L ptx HD initiated 02/22/18, nephrology optimistic this may not be a chronic need at this time  EP called to case 02/22/18 for persistent heart block post-op  Assessment & Plan      1. CHB  She is POD#5 CABG, MVR, MAZE, PFO closure New LBBB post-op  May need PPM implant LVEF by TTE yesterday 40-45%, new LBBB post op, likely CRT-P  I do not see any nodal blocking/rate limiting agents this  hospitalization She has R IJ in place w/temp HD catheter, started on HD yesterday (though perhaps will not need past this hospital stay) and a central line in the L IJ  Ideally some of these lines would be out prior to PPM implant to reduce significant risk of infection with them in place.  Her underlying V rate is in the 50's, remains with temp wires  Would like to see thoughts from nephrology today Good urine OP yesterday      For questions or updates, please contact [place] Please consult [email or web address] for contact info under Cardiology/STEMI.      Signed, Baldwin Jamaica, PA-C  02/23/2018, 8:54 AM     I have seen, examined the patient, and reviewed the above assessment and plan.  Changes to above are made where necessary.  On exam, RRR (paced).  Underlying rhythm is sinus with second/ third degree AV block.  Very steady escape rhythm.  Epicardial threshold 4 mA. I would like to see at least L IJ catheter removed, if not R IJ catheter as well before we proceed with pacing.  I will speak with Dr Roxy Manns tomorrow.  One option would be to program temp pacer to VVI 40 bpm and observe.  Another options would be to consider pacing sooner rather than later.  She is certainly quite ill.  I would err on the side of a more conservative course.  Co Sign: Thompson Grayer, MD 02/23/2018

## 2018-02-23 NOTE — Progress Notes (Signed)
Patient ID: Jocelyn Hill, female   DOB: 09-25-1950, 67 y.o.   MRN: [id number]   Progress Note  Patient Name: Jocelyn Hill Date of Encounter: 02/23/2018  Primary Cardiologist: No primary care provider on file.   Subjective   She had HD yesterday. UOP, however, still remains good at 2370 cc for 24hrs on IV Lasix.   Main complaint this morning is right arm pain (had PICC in this arm).   SBP 140s-160s off NTG gtt.    - Echo (8/26): EF 45-50%, global HK, bioprosthetic MV functioning normally, PFO closed.   02/18/2018  Mitral Valve Replacement Edwards Magna Mitral Bovine Bioprosthetic Tissue Valve (size52mm, model #7300TFX, serial #[id number])   Maze Procedure complete bilateral atrial lesion set using bipolar radiofrequency and cryothermy ablation clipping of left atrial appendage (Atricure Pro245 left atrial clipsize 26mm)   Coronary Artery Bypass Grafting x2 Left Internal Mammary Artery to Distal Left Anterior Descending Coronary Artery Saphenous Vein Graft to DistalLeft Circumflex Coronary Artery Endoscopic Vein Harvest from RightThigh    Closure of Patent Foramen Ovale  Inpatient Medications    Scheduled Meds: . acetaminophen  1,000 mg Oral Q6H  . amLODipine  10 mg Oral Daily  . aspirin EC  325 mg Oral Daily  . atorvastatin  80 mg Oral Daily  . buPROPion  100 mg Oral TID AC  . Chlorhexidine Gluconate Cloth  6 each Topical Daily  . Chlorhexidine Gluconate Cloth  6 each Topical Q0600  . doxazosin  4 mg Oral Daily  . enoxaparin (LOVENOX) injection  30 mg Subcutaneous QHS  . hydrALAZINE  100 mg Oral Q8H  . isosorbide mononitrate  120 mg Oral Daily  . levothyroxine  112 mcg Oral QAC breakfast  . mouth rinse  15 mL Mouth Rinse BID  . montelukast  10 mg Oral Daily  . pantoprazole  40 mg Oral Daily   Continuous Infusions: . sodium chloride    . sodium chloride    . furosemide 62  mL/hr at 02/23/18 0700  . lactated ringers Stopped (02/23/18 8676)  . potassium chloride 10 mEq (02/23/18 0738)   PRN Meds: sodium chloride, sodium chloride, ALPRAZolam, alteplase, calcium carbonate, heparin, lidocaine (PF), lidocaine-prilocaine, metoprolol tartrate, morphine injection, ondansetron (ZOFRAN) IV, oxyCODONE, pentafluoroprop-tetrafluoroeth, potassium chloride, sodium chloride flush, sodium chloride flush, traMADol   Vital Signs    Vitals:   02/23/18 0600 02/23/18 0628 02/23/18 0700 02/23/18 0727  BP: 139/90 (!) 141/97 (!) 165/90 (!) 166/88  Pulse: 85  97   Resp: 19  (!) 22   Temp:    98.7 F (37.1 C)  TempSrc:    Oral  SpO2: 97%  94%   Weight:   60.9 kg   Height:        Intake/Output Summary (Last 24 hours) at 02/23/2018 0740 Last data filed at 02/23/2018 0700 Gross per 24 hour  Intake 1091.91 ml  Output 3500 ml  Net -2408.09 ml   Filed Weights   02/22/18 0700 02/22/18 0909 02/23/18 0700  Weight: 61.7 kg 61 kg 60.9 kg    Telemetry    Currently NSR with V-pacing (personally reviewed).  When pacemaker turned off, she still appears to have high grade heart block with rate in 60s.   ECG    No new tracing, most recent tracing showed sinus rhythm with very long PR interval and occasional dropped P waves consistent with Mobitz type I second-degree AV block, LBBB (old)- Personally Reviewed  Physical Exam   General: NAD Neck: JVP 10  cm, no thyromegaly or thyroid nodule.  Lungs: Decreased BS dependently CV: Nondisplaced PMI.  Heart regular S1/S2 with widely split S2, no S3/S4, 1/6 SEM RUSB. Trace ankle edema.  Abdomen: Soft, nontender, no hepatosplenomegaly, no distention.  Skin: Intact without lesions or rashes.  Neurologic: Alert and oriented x 3.  Psych: Normal affect. Extremities: No clubbing or cyanosis.  HEENT: Normal.   Labs    Chemistry Recent Labs  Lab 02/17/18 0353  02/21/18 0355 02/22/18 0426 02/23/18 0446  NA 135   < > 134* 129* 132*  K  4.2   < > 3.9 3.4* 3.5  CL 96*   < > 98 93* 96*  CO2 30   < > 20* 20* 24  GLUCOSE 136*   < > 140* 122* 244*  BUN 55*   < > 84* 91* 57*  CREATININE 5.49*   < > 5.31* 5.53* 4.01*  CALCIUM 10.1   < > 8.4* 8.0* 8.1*  PROT 7.3  --   --   --   --   ALBUMIN 4.2  --   --   --   --   AST 16  --   --   --   --   ALT 15  --   --   --   --   ALKPHOS 96  --   --   --   --   BILITOT 0.9  --   --   --   --   GFRNONAA 7*   < > 8* 7* 11*  GFRAA 8*   < > 9* 8* 12*  ANIONGAP 9   < > 16* 16* 12   < > = values in this interval not displayed.     Hematology Recent Labs  Lab 02/20/18 0339 02/21/18 0355 02/22/18 0426  WBC 18.8* 17.8* 12.5*  RBC 3.72* 3.30* 3.16*  HGB 10.2* 9.3* 8.8*  HCT 30.9* 26.9* 25.7*  MCV 83.1 81.5 81.3  MCH 27.4 28.2 27.8  MCHC 33.0 34.6 34.2  RDW 15.3 15.1 14.8  PLT 79* 89* 120*    Cardiac Enzymes No results for input(s): TROPONINI in the last 168 hours. No results for input(s): TROPIPOC in the last 168 hours.   BNP Recent Labs  Lab 02/17/18 0353  BNP 675.7*     DDimer No results for input(s): DDIMER in the last 168 hours.   Radiology    Dg Chest 1v Repeat Same Day  Result Date: 02/21/2018 CLINICAL DATA:  Pneumothorax.  Chest tube removed yesterday. EXAM: CHEST - 1 VIEW SAME DAY COMPARISON:  One-view chest x-ray 02/21/2018 at 4:59 a.m. FINDINGS: Bilateral IJ lines are stable. Mediastinal drains have been removed. The patient is status post mitral valve replacement, coronary artery bypass grafting, and atrial clipping. Cardiac pacing wires remain in place. The left apical pneumothorax is stable to slightly smaller than on prior exam. The right lung is clear. A left pleural effusion and basilar airspace disease is again noted. IMPRESSION: 1. Stable to slight decrease in size of left apical pneumothorax. 2. Interval removal mediastinal drains. 3. Moderate left pleural effusion and basilar airspace disease. This likely reflects atelectasis. Electronically Signed   By:  San Morelle M.D.   On: 02/21/2018 14:37   Dg Chest Port 1 View  Result Date: 02/22/2018 CLINICAL DATA:  Follow-up pneumothorax EXAM: PORTABLE CHEST 1 VIEW COMPARISON:  02/21/2018 FINDINGS: Cardiac shadow is enlarged but stable. Postsurgical changes are again seen. Bilateral jugular catheters are again noted and stable. The  right lung is well aerated with minimal basilar atelectasis. Left retrocardiac atelectasis is again noted. Left-sided apical pneumothorax is noted slightly improved when compared with the prior exam. No bony abnormality is noted. IMPRESSION: Mild right basilar atelectasis new from the prior exam. Stable left retrocardiac atelectasis. Some improvement in left pneumothorax is noted. Electronically Signed   By: Inez Catalina M.D.   On: 02/22/2018 07:32    Cardiac Studies   Cath 07/03/2017 1. Low filling pressure and preserved cardiac output.  2. 90% ostial LCx stenosis.  3. Napkin ring-like stenosis in the proximal LAD, at least 60% stenosis.   ECHO 02/10/2018 - Left ventricle: The cavity size was mildly dilated. There was moderate concentric hypertrophy. Systolic function was normal. The estimated ejection fraction was in the range of 55% to 60%. Wall motion was normal; there were no regional wall motion abnormalities. Features are consistent with a pseudonormal left ventricular filling pattern, with concomitant abnormal relaxation and increased filling pressure (grade 2 diastolic dysfunction). Doppler parameters are consistent with high ventricular filling pressure. - Mitral valve: Mild thickening and calcification of the anterior leaflet, consistent with rheumatic disease. Mobility was restricted. Diastolic leaflet doming was present. The findings are consistent with trivial stenosis with mean MV gradient of 35mmHg. There was moderate regurgitation. Effective regurgitant orifice (PISA): 0.17 cm^2. Regurgitant volume (PISA): 24 ml. -  Left atrium: The atrium was moderately dilated. - Tricuspid valve: There was mild regurgitation. - Pulmonary arteries: PA peak pressure: 39 mm Hg (S). - Pericardium, extracardiac: A trivial pericardial effusion was identified posterior to the heart.  Impressions:  - Mildly dilated LV with moderate LVF and normal LVF with EF 55-60%. There is moderate LAE, mild TR and mild pulmonary HTN with PASP 71mmHg. The MV appears rheumatic with thickening and diastolic doming. There appears to be at least moderate MR with ERO of 0.17cm2 and regurgitant volume of 24cc. The right ventricular systolic pressure was increased consistent with mild pulmonary hypertension.   TEE intraop 02/18/2018  Right ventricle: Right ventricular cavity was normal in size. There was normal RV systolic function.  Tricuspid valve: The tricuspid valve appeared structurally normal. There was trace tricuspid insufficiency.  Pulmonic valve: The pulmonic valve appeared structurally normal. There was trace pulmonic insufficiency.  Mitral valve: The mitral leaflets were thickened but appeared to open without restriction. There was severe mitral regurgitation with a central jet. The ERO by the PISA method was 0.5 cm. The regurgitant volume was 70 cc. There were no flail or prolapsing leaflet segments. On the post bypass exam, there was a bioprosthetic valve in the mitral position. The leaflets opened normally. There was a central jet of mitral insufficiency that was mild. After careful interrogation of the sewing  Right atrium: Cavity is mildly dilated.  Patient Profile     67 y.o. female with severe 2 vessel CAD, severe MR, preserved LVEF, paroxysmal atrial fibrillation, CKD stage 4, severe HTN now s/p CABG/MVR/MAZE on 08/22, had postop complete heart block, subsequently second-degree AV block Mobitz type I  Assessment & Plan    1. CAD s/p CABG: has surgical pain.  LBBB is pre-existing. - Continue ASA and  statin.  2. 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  3. Heart block: She currently is in NSR with V-pacing from pacing wires.  When pacing turned down, she remains in heart block with PVCs => this appears to be high grade HB.  - Avoid nodal blockers.  - EP has seen.  May end up needing a PPM, with low EF at 45-50% may benefit from BiV pacing.  Higher risk of infection with dialysis catheter in place and possible need for permanent HD. Will watch a few more days with temporary pacing wires in place to look for recovery (doubt as she had conduction abnormalities pre-op).  4. AFib s/p MAZE: no afib seen on monitor since surgery - Will need resumption of warfarin eventually.   5. HTN: BP remains high, off NTG gtt.   - Increase doxazosin to 4 mg daily.  - Continue hydralazine and amlodipine.  6. Acute on chronic stage 4 renal insufficiency:  Had HD yesterday.  She has good UOP, 2370 cc with IV lasix yesterday.  Possible that she may avoid long-term HD.  Renal following.  7. L apical pneumothorax: For now monitoring w/o chest tube.  8. Acute on chronic primarily diastolic CHF: Weight down 2 lbs. Still up considerably from pre-op.  Echo 8/26 with EF 45-50%.  - Monitor CVP. - Lasix 120 mg IV bid as she continues to have good UOP.  - Got HD yesterday, following for long-term needs.  9. Anemia: Post-op and renal disease.  10. Right arm pain: Had PICC in right arm, check [place] to rule out DVT.    For questions or updates, please contact [place] Please consult [email or web address] for contact info under Cardiology/STEMI.      Signed, [name], MD  02/23/2018, 7:40 AM

## 2018-02-23 NOTE — Progress Notes (Addendum)
[facility] DAILY ICU PROGRESS NOTE                   [id number]            , [id number]          [phone]   5 Days Post-Op Procedure(s) (LRB): MITRAL VALVE (MV) REPLACEMENT (N/A) CORONARY ARTERY BYPASS GRAFTING (CABG) WITH IMA. ENDOSCOPIC VEIN HARVEST. IMA TO LAD, SVG TO CIRC. (N/A) MAZE (N/A) TRANSESOPHAGEAL ECHOCARDIOGRAM (TEE) (N/A) PLACEMENT OF TEMPORARY HD CATHETER, POWER TRIALYSIS 13FR 20CM (N/A) PATENT FORAMEN OVALE (PFO) CLOSURE (N/A)  Total Length of Stay:  LOS: 14 days   Subjective: Having some incisional pain this morning. She walked a little yesterday but she says it dodn't go well.   Objective: Vital signs in last 24 hours: Temp:  [97.3 F (36.3 C)-98.7 F (37.1 C)] 98.7 F (37.1 C) (08/27 0727) Pulse Rate:  [43-97] 87 (08/27 0749) Cardiac Rhythm: Normal sinus rhythm;A-V Sequential paced (08/27 0738) Resp:  [9-22] 15 (08/27 0749) BP: (118-175)/(67-104) 166/88 (08/27 0727) SpO2:  [91 %-99 %] 98 % (08/27 0749) Weight:  [60.9 kg-61 kg] 60.9 kg (08/27 0700)  Filed Weights   02/22/18 0700 02/22/18 0909 02/23/18 0700  Weight: 61.7 kg 61 kg 60.9 kg    Weight change: -0.689 kg   Hemodynamic parameters for last 24 hours: CVP:  [6 mmHg-8 mmHg] 7 mmHg  Intake/Output from previous day: 08/26 0701 - 08/27 0700 In: 1091.9 [P.O.:720; I.V.:245.6; IV Piggyback:126.3] Out: 3500 [Urine:3000]  Intake/Output this shift: Total I/O In: 36.6 [I.V.:0.6; IV Piggyback:35.9] Out: 75 [Urine:75]  Current Meds: Scheduled Meds: . acetaminophen  1,000 mg Oral Q6H  . amLODipine  10 mg Oral Daily  . aspirin EC  325 mg Oral Daily  . atorvastatin  80 mg Oral Daily  . buPROPion  100 mg Oral TID AC  . Chlorhexidine Gluconate Cloth  6 each Topical Daily  . Chlorhexidine Gluconate Cloth  6 each Topical Q0600  . doxazosin  4 mg Oral Daily  . enoxaparin (LOVENOX) injection  30 mg Subcutaneous QHS  . hydrALAZINE  100 mg Oral Q8H  . isosorbide mononitrate  120  mg Oral Daily  . levothyroxine  112 mcg Oral QAC breakfast  . mouth rinse  15 mL Mouth Rinse BID  . montelukast  10 mg Oral Daily  . pantoprazole  40 mg Oral Daily   Continuous Infusions: . sodium chloride    . sodium chloride    . furosemide Stopped (02/23/18 0734)  . lactated ringers Stopped (02/23/18 0738)  . potassium chloride 10 mEq (02/23/18 0738)   PRN Meds:.sodium chloride, sodium chloride, ALPRAZolam, alteplase, calcium carbonate, heparin, lidocaine (PF), lidocaine-prilocaine, metoprolol tartrate, morphine injection, ondansetron (ZOFRAN) IV, oxyCODONE, pentafluoroprop-tetrafluoroeth, potassium chloride, sodium chloride flush, sodium chloride flush, traMADol  General appearance: alert, cooperative and no distress Heart: regular rate and rhythm, S1, S2 normal, no murmur, click, rub or gallop and v-paced Lungs: clear to auscultation bilaterally and diminshed in bilateral lower lobes Abdomen: soft, non-tender; bowel sounds normal; no masses,  no organomegaly Extremities: extremities normal, atraumatic, no cyanosis or edema Wound: clean and dry  Lab Results: CBC: Recent Labs    02/21/18 0355 02/22/18 0426  WBC 17.8* 12.5*  HGB 9.3* 8.8*  HCT 26.9* 25.7*  PLT 89* 120*   BMET:  Recent Labs    02/22/18 0426 02/23/18 0446  NA 129* 132*  K 3.4* 3.5  CL 93* 96*  CO2 20* 24  GLUCOSE 122*  244*  BUN 91* 57*  CREATININE 5.53* 4.01*  CALCIUM 8.0* 8.1*    CMET: Lab Results  Component Value Date   WBC 12.5 (H) 02/22/2018   HGB 8.8 (L) 02/22/2018   HCT 25.7 (L) 02/22/2018   PLT 120 (L) 02/22/2018   GLUCOSE 244 (H) 02/23/2018   CHOL 159 06/18/2017   TRIG 146 06/18/2017   HDL 66 06/18/2017   LDLCALC 64 06/18/2017   ALT 15 02/17/2018   AST 16 02/17/2018   NA 132 (L) 02/23/2018   K 3.5 02/23/2018   CL 96 (L) 02/23/2018   CREATININE 4.01 (H) 02/23/2018   BUN 57 (H) 02/23/2018   CO2 24 02/23/2018   TSH 2.884 10/26/2017   INR 1.10 02/23/2018   HGBA1C 6.0 (H)  02/17/2018      PT/INR:  Recent Labs    02/23/18 0446  LABPROT 14.1  INR 1.10   Radiology: No results found.   Assessment/Plan: S/P Procedure(s) (LRB): MITRAL VALVE (MV) REPLACEMENT (N/A) CORONARY ARTERY BYPASS GRAFTING (CABG) WITH IMA. ENDOSCOPIC VEIN HARVEST. IMA TO LAD, SVG TO CIRC. (N/A) MAZE (N/A) TRANSESOPHAGEAL ECHOCARDIOGRAM (TEE) (N/A) PLACEMENT OF TEMPORARY HD CATHETER, POWER TRIALYSIS 13FR 20CM (N/A) PATENT FORAMEN OVALE (PFO) CLOSURE (N/A)  1. CV-1st degree AVB under pacer. Currently v-paced at 65. BP with MAPs in the 90s-100s. Cardiology following and assisting. EP consulted for PPM evaluation.  2. Pulm-tolerating 2L [place]. CXR yesterday shows mild right basilar atelectasis new from prior exam. No new studies 3. Renal-on HD, making some urine, creatinine 4.01, hypokalemia. Nephrology following and assisting 4. H and H stable, expected acute blood loss anemia. Thrombocytopenia improving, platelets 120k  5. Endo-blood glucose well controlled on current regimen 6. Holding coumadin for now in case she needs long-term dialysis access, will continue with lovenox for DVT prophylaxis. INR today 1.10  Plan: continues to make slow and steady progress. Work on pain control today. Encouraged incentive spirometer and weaning of oxygen as tolerated.    Elgie Collard 02/23/2018 8:58 AM    I have seen and examined the patient and agree with the assessment and plan as outlined.  Still in 2nd/3rd degree AV block under pacer.  Making some urine and electrolytes okay.  Unclear whether or not patient will need long term dialysis access.  Rexene Alberts, MD 02/23/2018 12:38 PM

## 2018-02-23 NOTE — Progress Notes (Signed)
Preliminary notes--Right upper extremity venous duplex exam completed. Negative for deep vein thrombosis, acute thrombosis seen at cephalic vein at forearm.  Marketia Stallsmith H Carloyn Lahue(RDMS RVT) 02/23/18 1:36 PM

## 2018-02-23 NOTE — Progress Notes (Signed)
Occupational Therapy Treatment Patient Details Name: Jocelyn Hill MRN: [id number] DOB: [birth year] Today's Date: 02/23/2018    History of present illness 67 yo admitted from heart failure clinic with chest pain and SOB. Pt with planned CABG/MVR/MAze for 8/29 but moved up to 8/22 PMHx: h/o mitral regurgitation, chronic diastolic CHF, CAD s/p MI, HTN, DM2, CKD IV-V, h/o Stroke, anxiety, depression and GERD   OT comments  Pt continues to progress slowly towards established OT goals. Per RN, pt planning for imaging of RUE later today due to pain, warm to touch, and decreased AROM. Maintained no ROM of RUE throughout session. Pt requiring Min A for functional transfer and Min-Mod A for bed mobility. Pt presenting with decreased balance and activity tolerance and continues to reports significant fatigue. Pending pt progress, pt may need post-acute rehab to optimzie safety and independence. Continue to recommend dc to home with HHOT and will continue to follow acutely as admitted.    Follow Up Recommendations  Home health OT;Supervision/Assistance - 24 hour    Equipment Recommendations  3 in 1 bedside commode    Recommendations for Other Services PT consult    Precautions / Restrictions Precautions Precautions: Fall;Sternal Precaution Booklet Issued: Yes (comment) Precaution Comments: RN reports pt planning for x-ray of RUE later today. No ROM of RUE untill cleared Restrictions Weight Bearing Restrictions: Yes(Sternal precautions) Other Position/Activity Restrictions: No movement of RUE until cleared with imaging       Mobility Bed Mobility Overal bed mobility: Needs Assistance Bed Mobility: Rolling;Sit to Sidelying Rolling: Min assist       Sit to sidelying: Mod assist General bed mobility comments: Mod A to elevate BLEs and then Min A to facilitate safe rolling  Transfers Overall transfer level: Needs assistance Equipment used: None Transfers: Sit to/from [employer] Sit to Stand: Min assist Stand pivot transfers: Min assist       General transfer comment: Min A for power up and present posterior lean.     Balance Overall balance assessment: Needs assistance Sitting-balance support: No upper extremity supported;Feet supported Sitting balance-Leahy Scale: Fair     Standing balance support: Single extremity supported Standing balance-Leahy Scale: Poor Standing balance comment: Requiring Min A                           ADL either performed or assessed with clinical judgement   ADL Overall ADL's : Needs assistance/impaired     Grooming: Wash/dry hands;Sitting;Minimal assistance Grooming Details (indicate cue type and reason): Min A to apply hand sanitizer to LUE             Lower Body Dressing: Maximal assistance Lower Body Dressing Details (indicate cue type and reason): Donning socks Toilet Transfer: Minimal assistance;Stand-pivot(Simulated to recliner<>EOB) Toilet Transfer Details (indicate cue type and reason): Min A for balance Toileting- Clothing Manipulation and Hygiene: Minimal assistance;Sit to/from stand Toileting - Clothing Manipulation Details (indicate cue type and reason): Min A for standing balance and then to make sure to was completely clean. Educating pt on compensatory techniques for toielt hygiene to adhere sternal precautions     Functional mobility during ADLs: Minimal assistance(stand pivot only due to fatigue) General ADL Comments: Pt with decreased strength, balance, and activity tolerance. Pt signfiicantly fatigued. Pt also presenting with decreased funcitonal use of RUE (dominant hand).      Vision       Quarry manager  Arousal/Alertness: Awake/alert Behavior During Therapy: Flat affect Overall Cognitive Status: Impaired/Different from baseline Area of Impairment: Safety/judgement                         Safety/Judgement: Decreased awareness of  deficits     General Comments: Fatigued and distracted by pain.         Exercises     Shoulder Instructions       General Comments VSS    Pertinent Vitals/ Pain       Pain Assessment: Faces Faces Pain Scale: Hurts whole lot Pain Location: RUE and chest Pain Descriptors / Indicators: Aching;Sore Pain Intervention(s): Monitored during session;Limited activity within patient's tolerance;Repositioned  Home Living                                          Prior Functioning/Environment              Frequency  Min 2X/week        Progress Toward Goals  OT Goals(current goals can now be found in the care plan section)  Progress towards OT goals: Progressing toward goals  Acute Rehab OT Goals Patient Stated Goal: feel better OT Goal Formulation: With patient Time For Goal Achievement: 03/01/18 Potential to Achieve Goals: Good ADL Goals Pt Will Perform Grooming: with modified independence;standing Pt Will Perform Lower Body Dressing: with modified independence;sit to/from stand Pt Will Transfer to Toilet: with modified independence;ambulating;regular height toilet Pt Will Perform Toileting - Clothing Manipulation and hygiene: with modified independence;sit to/from stand Pt Will Perform Tub/Shower Transfer: Tub transfer;with supervision;3 in 1;ambulating  Plan Discharge plan remains appropriate    Co-evaluation                 AM-PAC PT "6 Clicks" Daily Activity     Outcome Measure   Help from another person eating meals?: None Help from another person taking care of personal grooming?: A Little Help from another person toileting, which includes using toliet, bedpan, or urinal?: A Little Help from another person bathing (including washing, rinsing, drying)?: A Little Help from another person to put on and taking off regular upper body clothing?: A Little Help from another person to put on and taking off regular lower body clothing?: A  Little 6 Click Score: 19    End of Session Equipment Utilized During Treatment: Oxygen  OT Visit Diagnosis: Unsteadiness on feet (R26.81);Other abnormalities of gait and mobility (R26.89);Muscle weakness (generalized) (M62.81);Pain Pain - part of body: (Chest)   Activity Tolerance Patient limited by fatigue;Patient limited by pain   Patient Left with call bell/phone within reach;in bed   Nurse Communication Mobility status        Time: 1517-6160 OT Time Calculation (min): 30 min  Charges: OT General Charges $OT Visit: 1 Visit OT Treatments $Self Care/Home Management : 23-37 mins  [place], OTR/L Acute Rehab Pager: [phone] Office: [place] 02/23/2018, 12:18 PM

## 2018-02-23 NOTE — Plan of Care (Signed)
  Problem: Education: Goal: Knowledge of General Education information will improve Description Including pain rating scale, medication(s)/side effects and non-pharmacologic comfort measures Outcome: Progressing   Problem: Health Behavior/Discharge Planning: Goal: Ability to manage health-related needs will improve Outcome: Progressing   Problem: Clinical Measurements: Goal: Ability to maintain clinical measurements within normal limits will improve Outcome: Progressing Goal: Will remain free from infection Outcome: Progressing Goal: Diagnostic test results will improve Outcome: Progressing Goal: Respiratory complications will improve Outcome: Progressing Goal: Cardiovascular complication will be avoided Outcome: Progressing   Problem: Activity: Goal: Risk for activity intolerance will decrease Outcome: Progressing   Problem: Nutrition: Goal: Adequate nutrition will be maintained Outcome: Progressing   Problem: Coping: Goal: Level of anxiety will decrease Outcome: Progressing   Problem: Elimination: Goal: Will not experience complications related to bowel motility Outcome: Progressing Goal: Will not experience complications related to urinary retention Outcome: Progressing   Problem: Safety: Goal: Ability to remain free from injury will improve Outcome: Progressing   Problem: Skin Integrity: Goal: Risk for impaired skin integrity will decrease Outcome: Progressing   Problem: Education: Goal: Will demonstrate proper wound care and an understanding of methods to prevent future damage Outcome: Progressing Goal: Knowledge of disease or condition will improve Outcome: Progressing Goal: Knowledge of the prescribed therapeutic regimen will improve Outcome: Progressing Goal: Individualized Educational Video(s) Outcome: Progressing   Problem: Activity: Goal: Risk for activity intolerance will decrease Outcome: Progressing   Problem: Cardiac: Goal: Will achieve  and/or maintain hemodynamic stability Outcome: Progressing   Problem: Clinical Measurements: Goal: Postoperative complications will be avoided or minimized Outcome: Progressing   Problem: Respiratory: Goal: Respiratory status will improve Outcome: Progressing   Problem: Skin Integrity: Goal: Wound healing without signs and symptoms of infection Outcome: Progressing Goal: Risk for impaired skin integrity will decrease Outcome: Progressing   Problem: Urinary Elimination: Goal: Ability to achieve and maintain adequate renal perfusion and functioning will improve Outcome: Progressing

## 2018-02-23 NOTE — Progress Notes (Signed)
[facility] BRIEF SICU PROGRESS NOTE  5 Days Post-Op  S/P Procedure(s) (LRB): MITRAL VALVE (MV) REPLACEMENT (N/A) CORONARY ARTERY BYPASS GRAFTING (CABG) WITH IMA. ENDOSCOPIC VEIN HARVEST. IMA TO LAD, SVG TO CIRC. (N/A) MAZE (N/A) TRANSESOPHAGEAL ECHOCARDIOGRAM (TEE) (N/A) PLACEMENT OF TEMPORARY HD CATHETER, POWER TRIALYSIS 13FR 20CM (N/A) PATENT FORAMEN OVALE (PFO) CLOSURE (N/A)   Stable day  Plan: Continue current plan  Rexene Alberts, MD 02/23/2018 8:10 PM

## 2018-02-24 LAB — BASIC METABOLIC PANEL
Anion gap: 12 (ref 5–15)
BUN: 61 mg/dL — ABNORMAL HIGH (ref 8–23)
CO2: 25 mmol/L (ref 22–32)
Calcium: 8.4 mg/dL — ABNORMAL LOW (ref 8.9–10.3)
Chloride: 100 mmol/L (ref 98–111)
Creatinine, Ser: 4.2 mg/dL — ABNORMAL HIGH (ref 0.44–1.00)
GFR calc Af Amer: 12 mL/min — ABNORMAL LOW (ref >60)
GFR calc non Af Amer: 10 mL/min — ABNORMAL LOW (ref >60)
Glucose, Bld: 114 mg/dL — ABNORMAL HIGH (ref 70–99)
Potassium: 3.4 mmol/L — ABNORMAL LOW (ref 3.5–5.1)
Sodium: 137 mmol/L (ref 135–145)

## 2018-02-24 LAB — CBC
HCT: 26.1 % — ABNORMAL LOW (ref 36.0–46.0)
Hemoglobin: 8.9 g/dL — ABNORMAL LOW (ref 12.0–15.0)
MCH: 27.7 pg (ref 26.0–34.0)
MCHC: 34.1 g/dL (ref 30.0–36.0)
MCV: 81.3 fL (ref 78.0–100.0)
Platelets: 208 10*3/uL (ref 150–400)
RBC: 3.21 MIL/uL — ABNORMAL LOW (ref 3.87–5.11)
RDW: 14.6 % (ref 11.5–15.5)
WBC: 8.9 10*3/uL (ref 4.0–10.5)

## 2018-02-24 LAB — PROTIME-INR
INR: 1.11
Prothrombin Time: 14.3 seconds (ref 11.4–15.2)

## 2018-02-24 MED ORDER — FE FUMARATE-B12-VIT C-FA-IFC PO CAPS
1.0000 | ORAL_CAPSULE | Freq: Three times a day (TID) | ORAL | Status: DC
  Administered 2018-02-24 – 2018-03-05 (×29): 1 via ORAL
  Filled 2018-02-24 (×5): qty 1
  Filled 2018-02-24: qty 3
  Filled 2018-02-24 (×21): qty 1

## 2018-02-24 MED ORDER — WARFARIN SODIUM 1 MG PO TABS
1.0000 mg | ORAL_TABLET | Freq: Every day | ORAL | Status: DC
  Administered 2018-02-24 – 2018-02-28 (×5): 1 mg via ORAL
  Filled 2018-02-24 (×5): qty 1

## 2018-02-24 MED ORDER — MOVING RIGHT ALONG BOOK
Freq: Once | Status: AC
  Administered 2018-02-24: 09:00:00
  Filled 2018-02-24: qty 1

## 2018-02-24 MED ORDER — WARFARIN - PHYSICIAN DOSING INPATIENT
Freq: Every day | Status: DC
  Administered 2018-02-27 – 2018-03-04 (×4)

## 2018-02-24 NOTE — Progress Notes (Signed)
Patient ID: Jocelyn Hill, female   DOB: January 03, 1951, 67 y.o.   MRN: [id number] [facility] Progress Note   Assessment/ Plan:   1. 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  There are no indications for hemodialysis at this time and I agree with Dr. Guy Sandifer plan to discontinue her temporary hemodialysis catheter at this time.  If dialysis is indicated down the road, will have a tunneled hemodialysis catheter placed anticipating prolonged/chronic dialysis needs.  She is euvolemic on exam and does not have uremia.  Anticipate conversion to oral diuretics-plausibly torsemide 100 mg twice a day in the next 24-48 hours. 2.  Hyponatremia: Improving sodium levels noted with ongoing diuresis/stable renal function. 3.  Right arm swelling: With prior history of PICC line on the side-ultrasound without evidence of subclavian DVT but with high clinical suspicion for antecubital thrombus. 4.  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 5.  Anemia: Downtrending hemoglobin noted, ESA started, iron stores replete based on labs of 02/10/2018 6.  Volume/hypertension: Blood pressure continues to improve with ongoing diuresis/urine output.  Subjective:   Reports right arm pain/swelling and intermittent difficulty with shortness of breath due to inability to take a full breath (chest wall/wound pain).   Objective:   BP 139/78   Pulse 87   Temp 98 F (36.7 C) (Oral)   Resp (!) 24   Ht 5\' 9"  (1.753 m)   Wt 60.3 kg   SpO2 98%   BMI 19.63 kg/m   Intake/Output Summary (Last 24 hours) at 02/24/2018 0738 Last data filed at 02/24/2018 1829 Gross per 24 hour  Intake 1854.92 ml  Output 2120 ml  Net -265.08 ml   Weight change: -0.7 kg  Physical  Exam: Gen: Comfortably resting in bed, awaken and most alert that I have seen her in the past 3 days CVS: Pulse regular rhythm, normal rate, S1 and S2 without any murmurs or rubs Resp: Poor inspiratory effort, decreased breath sounds over bases, no rales/rhonchi Abd: Soft, flat, nontender Ext: No pretibial edema appreciated.  Right upper arm swelling noted.  Imaging: No results found.  Labs: BMET Recent Labs  Lab 02/19/18 0341 02/19/18 1701 02/19/18 1702 02/20/18 0339 02/21/18 0355 02/22/18 0426 02/23/18 0446 02/24/18 0437  NA 139  --  139 137 134* 129* 132* 137  K 4.0  --  3.9 4.2 3.9 3.4* 3.5 3.4*  CL 106  --  102 102 98 93* 96* 100  CO2 25  --   --  21* 20* 20* 24 25  GLUCOSE 148*  --  207* 187* 140* 122* 244* 114*  BUN 56*  --  53* 67* 84* 91* 57* 61*  CREATININE 4.40* 4.76* 5.10* 5.09* 5.31* 5.53* 4.01* 4.20*  CALCIUM 9.1  --   --  9.1 8.4* 8.0* 8.1* 8.4*   CBC Recent Labs  Lab 02/18/18 2104  02/20/18 0339 02/21/18 0355 02/22/18 0426 02/24/18 0437  WBC 17.7*   < > 18.8* 17.8* 12.5* 8.9  NEUTROABS 16.0*  --   --   --   --   --   HGB 11.9*   < > 10.2* 9.3* 8.8* 8.9*  HCT 35.6*   < > 30.9* 26.9* 25.7* 26.1*  MCV 82.4   < > 83.1 81.5 81.3 81.3  PLT 141*   < >  79* 89* 120* 208   < > = values in this interval not displayed.    Medications:    . amLODipine  10 mg Oral Daily  . aspirin EC  325 mg Oral Daily  . atorvastatin  80 mg Oral Daily  . buPROPion  100 mg Oral TID AC  . Chlorhexidine Gluconate Cloth  6 each Topical Daily  . Chlorhexidine Gluconate Cloth  6 each Topical Q0600  . doxazosin  4 mg Oral Daily  . enoxaparin (LOVENOX) injection  30 mg Subcutaneous QHS  . feeding supplement  1 Container Oral TID BM  . ferrous [id number] C-folic acid  1 capsule Oral TID PC  . hydrALAZINE  100 mg Oral Q8H  . isosorbide mononitrate  120 mg Oral Daily  . levothyroxine  112 mcg Oral QAC breakfast  . mouth rinse  15 mL Mouth Rinse BID  . montelukast  10  mg Oral Daily  . moving right along book   Does not apply Once  . pantoprazole  40 mg Oral Daily   Elmarie Shiley, MD 02/24/2018, 7:38 AM

## 2018-02-24 NOTE — Progress Notes (Signed)
Progress Note   Subjective   Doing well today, the patient denies CP or SOB.  No new concerns  Inpatient Medications    Scheduled Meds: . amLODipine  10 mg Oral Daily  . aspirin EC  325 mg Oral Daily  . atorvastatin  80 mg Oral Daily  . buPROPion  100 mg Oral TID AC  . Chlorhexidine Gluconate Cloth  6 each Topical Daily  . Chlorhexidine Gluconate Cloth  6 each Topical Q0600  . doxazosin  4 mg Oral Daily  . enoxaparin (LOVENOX) injection  30 mg Subcutaneous QHS  . feeding supplement  1 Container Oral TID BM  . ferrous [id number] C-folic acid  1 capsule Oral TID PC  . hydrALAZINE  100 mg Oral Q8H  . isosorbide mononitrate  120 mg Oral Daily  . levothyroxine  112 mcg Oral QAC breakfast  . mouth rinse  15 mL Mouth Rinse BID  . montelukast  10 mg Oral Daily  . pantoprazole  40 mg Oral Daily  . warfarin  1 mg Oral q1800  . Warfarin - Physician Dosing Inpatient   Does not apply q1800   Continuous Infusions: . sodium chloride    . sodium chloride    . furosemide Stopped (02/24/18 1027)  . lactated ringers Stopped (02/23/18 0738)  . potassium chloride Stopped (02/23/18 0838)   PRN Meds: sodium chloride, sodium chloride, ALPRAZolam, alteplase, calcium carbonate, heparin, lidocaine (PF), lidocaine-prilocaine, metoprolol tartrate, morphine injection, ondansetron (ZOFRAN) IV, oxyCODONE, pentafluoroprop-tetrafluoroeth, potassium chloride, sodium chloride flush, sodium chloride flush, traMADol   Vital Signs    Vitals:   02/24/18 0400 02/24/18 0500 02/24/18 0600 02/24/18 0700  BP: 135/84 (!) 144/87 (!) 152/85 139/78  Pulse: 84 85 88 87  Resp: 14 16 13  (!) 24  Temp:    98.6 F (37 C)  TempSrc:    Oral  SpO2: 100% 100% 91% 98%  Weight:  60.3 kg    Height:        Intake/Output Summary (Last 24 hours) at 02/24/2018 0924 Last data filed at 02/24/2018 2536 Gross per 24 hour  Intake 1084.92 ml  Output 1850 ml  Net -765.08 ml   Filed Weights   02/22/18 0909 02/23/18  0700 02/24/18 0500  Weight: 61 kg 60.9 kg 60.3 kg    Telemetry    Sinus with V pacing - Personally Reviewed  Physical Exam   GEN- alert and oriented x 3 today.   Head- normocephalic, atraumatic Eyes-  Sclera clear, conjunctiva pink Ears- hearing intact Oropharynx- clear Neck- supple, bilateral CVLs in place Lungs-  normal work of breathing Heart- Regular rate and rhythm  (paced) MS- no atrophy Skin- no rash or lesion Psych- euthymic mood, full affect Neuro- strength and sensation are intact   Labs    Chemistry Recent Labs  Lab 02/22/18 0426 02/23/18 0446 02/24/18 0437  NA 129* 132* 137  K 3.4* 3.5 3.4*  CL 93* 96* 100  CO2 20* 24 25  GLUCOSE 122* 244* 114*  BUN 91* 57* 61*  CREATININE 5.53* 4.01* 4.20*  CALCIUM 8.0* 8.1* 8.4*  GFRNONAA 7* 11* 10*  GFRAA 8* 12* 12*  ANIONGAP 16* 12 12     Hematology Recent Labs  Lab 02/21/18 0355 02/22/18 0426 02/24/18 0437  WBC 17.8* 12.5* 8.9  RBC 3.30* 3.16* 3.21*  HGB 9.3* 8.8* 8.9*  HCT 26.9* 25.7* 26.1*  MCV 81.5 81.3 81.3  MCH 28.2 27.8 27.7  MCHC 34.6 34.2 34.1  RDW 15.1 14.8 14.6  PLT  89* 120* 208    Cardiac EnzymesNo results for input(s): TROPONINI in the last 168 hours. No results for input(s): TROPIPOC in the last 168 hours.      Assessment & Plan    1.  Complete heart block Stable escape I spoke with Dr Roxy Manns this am.  We would prefer to be conservative and allow conduction to improve.  EP to follow along.  Thompson Grayer MD, [facility] 02/24/2018 9:24 AM

## 2018-02-24 NOTE — Discharge Summary (Addendum)
[address]       [place] [address]             [phone]      Physician Discharge Summary  Patient ID: Jocelyn Hill MRN: [id number] DOB/AGE: 07/09/1950 67 y.o.  Admit date: 02/09/2018 Discharge date: 03/05/2018  Admission Diagnoses: Patient Active Problem List   Diagnosis Date Noted  . Malnutrition of moderate degree 02/11/2018  . Chest pain due to CAD 02/09/2018  . AF (paroxysmal atrial fibrillation) ([place])   . Coronary artery disease involving native coronary artery of native heart   . Rheumatic mitral regurgitation   . Acute renal failure superimposed on stage 4 chronic kidney disease ([place])   . Controlled type 2 diabetes mellitus with diabetic nephropathy ([place])   . Anxiety   . Protein-calorie malnutrition, severe 04/21/2017  . Substance abuse ([place])   . Acute on chronic diastolic CHF (congestive heart failure) ([place])   . Pulmonary hypertension ([place])   . Anemia due to chronic kidney disease   . Controlled diabetes mellitus type 2 with complications ([place])   . Severe mitral regurgitation   . Stage 4 chronic kidney disease ([place])   . Microcytic anemia 03/13/2017  . Hyperlipidemia 03/12/2017  . Hypertension 03/12/2017  . Hypothyroidism 03/12/2017  . Depression 03/12/2017  . CHF (congestive heart failure) ([place]) 03/12/2017  . Dysphagia 03/12/2017     Discharge Diagnoses:  Patient Active Problem List   Diagnosis Date Noted  . S/P biventricular cardiac pacemaker procedure 03/05/2018  . S/P CABG x 2 02/18/2018  . S/P mitral valve replacement with bioprosthetic valve + CABG x2 + maze procedure 02/18/2018  . S/P patent foramen ovale closure 02/18/2018  . S/P Maze operation for atrial fibrillation 02/18/2018  . Malnutrition of moderate degree 02/11/2018  . Chest pain due to CAD 02/09/2018  . AF (paroxysmal atrial fibrillation) ([place])   . Coronary artery disease involving native coronary artery of native heart   . Rheumatic mitral regurgitation   .  Acute renal failure superimposed on stage 4 chronic kidney disease ([place])   . Controlled type 2 diabetes mellitus with diabetic nephropathy ([place])   . Anxiety   . Protein-calorie malnutrition, severe 04/21/2017  . Substance abuse ([place])   . Acute on chronic diastolic CHF (congestive heart failure) ([place])   . Pulmonary hypertension ([place])   . Anemia due to chronic kidney disease   . Controlled diabetes mellitus type 2 with complications ([place])   . Severe mitral regurgitation   . Stage 4 chronic kidney disease ([place])   . Microcytic anemia 03/13/2017  . Hyperlipidemia 03/12/2017  . Hypertension 03/12/2017  . Hypothyroidism 03/12/2017  . Depression 03/12/2017  . CHF (congestive heart failure) ([place]) 03/12/2017  . Dysphagia 03/12/2017   Discharged Condition: good  HPI:   Jocelyn Hill is a 67 year old female patient with a past medical history significant for mitral regurgitation, chronic diastolic heart failure, hypothyroidism, coronary artery disease status post myocardial infarction, hypertension, diabetes mellitus type 2, chronic kidney disease stage IV-V, and history of stroke who has had numerous admissions for heart failure towards the end of 2018.  An echocardiogram was performed September 2018 which showed an estimated ejection fraction of 60 to 65% with severe mitral valve regurgitation.  There was mild to moderate tricuspid valve regurgitation.  She was also diagnosed with atrial fibrillation at this time and started on anticoagulation.  She was admitted again the following month with respiratory failure from [facility].  She  was not on a diuretic at this time and had gained 16 pounds at her skilled nursing facility.  She was treated medically with diuretics and discharged.  Dr. Roxy Manns saw the patient at this time and did not feel that she was a mitral valve repair/replacement candidate at that time.  She slowly recovered and has been followed carefully by Dr. Aundra Dubin in the Advanced Heart  Failure clinic ever since.  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  Cardiac catheterization was performed in January 2019 showing 90% ostial left circumflex stenosis and proximal LAD stenosis of at least 60%.  She improved considerably and had been scheduled for an MVR/CABG/MAZE with Dr. Roxy Manns last month but her surgery was postponed because she developed community acquired pneumonia.  She had tentatively been rescheduled for surgery later this month.  She returned yesterday to the heart failure clinic for follow-up.   Over the past week her chest pain has become much more frequent occurring 1-2 times per day.  She also has associated shortness of breath and radiation up to her bilateral jaw and down her right arm.  Symptoms usually resolve with 1-2 sublingual nitro tabs.  Pain can occur at rest or with activity.  She did have some dizziness at rest 2 days ago.  She was admitted to [facility] and placed on a nitroglycerin drip.   She is currently on Heparin gtt and Nitro gtt without any chest pain. Of note, she recently had dental surgery with Dr. Enrique Sack and is still having a significant amount of mouth pain. This may need re-evaluated before surgery.   Hospital Course:   On 02/18/2018 at [name] underwent a mitral valve replacement, Maze procedure, coronary artery bypass grafting x2, and closure of patent foramen ovale with Dr. Roxy Manns.  She tolerated procedure well and was transferred to the surgical ICU.  She was extubated timely manner.  Heart failure and nephrology were consulted to assist in care in the postoperative period.  Postop day 1 she was paced with a stable rhythm and hemodynamics.  We continued her on low-dose dopamine for renal perfusion.  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  We continued a  diuretic regimen for fluid overload.  We started Coumadin for valve thrombus prophylaxis.  Postop day 2 we restarted amlodipine due to elevated blood pressure.  To need to hold her beta-blocker due to an accelerated junctional rhythm.  She did have some expected acute on chronic renal failure but continued to have good urine output.  We discontinued her mediastinal chest tubes.  Cardiology was consulted for assistance with her rhythm.  We held Coumadin at this time.  She had hemodialysis the following morning.  Her pleural chest tubes were discontinued and she had a small stable left pneumothorax.  We continue to hold Coumadin for now in anticipation of possibly needing long-term dialysis access.  Electrophysiology was consulted for possible need of a permanent pacemaker.  She was weaned to 2 L nasal cannula with excellent oxygenation.  She remained on hemodialysis but continued to make some urine.  Her creatinine was trending downward direction.  She was stable to transfer to the telemetry unit for continued care on 02/24/2018.    Addendum: Her conduction system did not recover.  She required placement of a Bi-Ventricular pacemaker.  This was done  by Dr. Curt Bears on 02/26/2018.  Her renal function improved with her creatinine level improving to a level around 4.0.  She was able to produce sufficient urine and she would no longer require hemodialysis.  She was restarted on low dose Coumadin.  Her most recent INR is 2.11 and she will be discharged on 2.5 mg daily with a goal INR range of 2.5-3.0.  She was evaluated by PT/OT who recommended she attend SNF for further rehabilitation at discharge.  These arrangements have been made.  She continues to ambulate with assistance.  Her incisions are healing without evidence infection.  Her pacemaker has been interrogated and is functioning normally.  She is medically stable for discharge to SNF today.  We ask the SNF to please do the following: 1. Please obtain vital signs  at least one time daily 2.Please weigh the patient daily. If he or she continues to gain weight or develops lower extremity edema, contact the office at [phone]. 3. Ambulate patient at least three times daily and please use sternal precautions.  Consults: None  Significant Diagnostic Studies:   CLINICAL DATA:  Follow-up pneumothorax  EXAM: PORTABLE CHEST 1 VIEW  COMPARISON:  02/21/2018  FINDINGS: Cardiac shadow is enlarged but stable. Postsurgical changes are again seen. Bilateral jugular catheters are again noted and stable. The right lung is well aerated with minimal basilar atelectasis. Left retrocardiac atelectasis is again noted. Left-sided apical pneumothorax is noted slightly improved when compared with the prior exam. No bony abnormality is noted.  IMPRESSION: Mild right basilar atelectasis new from the prior exam.  Stable left retrocardiac atelectasis. Some improvement in left pneumothorax is noted.   Electronically Signed   By: Inez Catalina M.D.   On: 02/22/2018 07:32   Treatments:  CARDIOTHORACIC SURGERY OPERATIVE NOTE  Date of Procedure:                02/18/2018  Preoperative Diagnosis:        Severe Mitral Regurgitation  Severe Coronary Artery Disease  Recurrent Paroxysmal Atrial Fibrillation  Chronic Kidney Disease  Postoperative Diagnosis:      Severe Mitral Regurgitation  Severe Coronary Artery Disease  Recurrent Paroxysmal Atrial Fibrillation  Chronic Kidney Disease  Patent Foramen Ovale  Procedure:       Mitral Valve Replacement              Edwards Magna Mitral Bovine Bioprosthetic Tissue Valve (size 29 mm, model #7300TFX, serial #[id number])   Maze Procedure              complete bilateral atrial lesion set using bipolar radiofrequency and cryothermy ablation             clipping of left atrial appendage (Atricure Pro245 left atrial clip size 45 mm)   Coronary Artery Bypass Grafting x 2              Left  Internal Mammary Artery to Distal Left Anterior Descending Coronary Artery             Saphenous Vein Graft to Distal Left Circumflex Coronary Artery             Endoscopic Vein Harvest from Right Thigh    Closure of Patent Foramen Ovale   Placement of Right Internal Jugular Tridialysis temporary hemodialysis catheter               Surgeon:        Valentina Gu. Roxy Manns, MD  Assistant:       Junie Panning  Barrett, PA-C  Anesthesia:    Roberts Gaudy, MD  Operative Findings: ? Rheumatic mitral valve disease ? Type IIIA dysfunction with severe mitral regurgitation ? Normal left ventricular systolic function ? Good quality left internal mammary conduit for grafting ? Good quality saphenous vein conduit for grafting ? Good quality left anterior descending coronary artery target vessel ? Poor quality left circumflex coronary artery target vessel ? Patent foramen ovale  Discharge Exam: Blood pressure 132/73, pulse 86, temperature 97.8 F (36.6 C), temperature source Oral, resp. rate 14, height 5\' 9"  (1.753 m), weight 57.1 kg, SpO2 91 %.  Cardiovascular: Paced Pulmonary: Clear to auscultation bilaterally Abdomen: Soft, non tender, bowel sounds present. Extremities: No lower extremity edema. Wounds: Clean and dry.  No erythema or signs of infection.   Disposition:   Discharge Instructions    Amb Referral to Cardiac Rehabilitation   Complete by:  As directed    Referring to [place] Phase 2   Diagnosis:   CABG Valve Replacement     Valve:  Mitral Comment - MAZE   CABG X ___:  2     Allergies as of 03/05/2018      Reactions   Ace Inhibitors Anaphylaxis, Swelling   Motrin Ib [ibuprofen] Anaphylaxis      Medication List    STOP taking these medications   chlorhexidine 0.12 % solution Commonly known as:  PERIDEX   ELIQUIS 2.5 MG Tabs tablet Generic drug:  apixaban   furosemide 80 MG tablet Commonly known as:  LASIX   nitroGLYCERIN 0.4 MG SL tablet Commonly known as:   NITROSTAT   oxyCODONE-acetaminophen 5-325 MG tablet Commonly known as:  PERCOCET/ROXICET     TAKE these medications   acetaminophen 325 MG tablet Commonly known as:  TYLENOL Take 2 tablets (650 mg total) by mouth every 6 (six) hours as needed for mild pain, fever or headache.   albuterol 108 (90 Base) MCG/ACT inhaler Commonly known as:  PROVENTIL HFA;VENTOLIN HFA Inhale 2 puffs into the lungs every 6 (six) hours as needed for wheezing or shortness of breath.   ALPRAZolam 0.25 MG tablet Commonly known as:  XANAX Take 1 tablet (0.25 mg total) by mouth at bedtime as needed for anxiety.   amLODipine 10 MG tablet Commonly known as:  NORVASC Take 1 tablet (10 mg total) by mouth daily.   aspirin 81 MG EC tablet Take 1 tablet (81 mg total) by mouth daily.   atorvastatin 80 MG tablet Commonly known as:  LIPITOR Take 1 tablet (80 mg total) by mouth daily. Start taking on:  03/06/2018 What changed:    medication strength  how much to take   b complex vitamins tablet Take 1 tablet by mouth daily.   BEN GAY EX Apply 1 application topically as needed (back pain).   bisacodyl 5 MG EC tablet Commonly known as:  DULCOLAX Take 5 mg by mouth daily as needed for mild constipation or moderate constipation.   buPROPion 100 MG tablet Commonly known as:  WELLBUTRIN Take 100 mg by mouth 3 (three) times daily.   busPIRone 15 MG tablet Commonly known as:  BUSPAR Take 1 tablet (15 mg total) by mouth 2 (two) times daily.   calcium carbonate 750 MG chewable tablet Commonly known as:  TUMS EX Chew 2 tablets by mouth as needed for heartburn.   CARNATION BREAKFAST ESSENTIALS PO Take 237 mLs by mouth 2 (two) times daily.   carvedilol 6.25 MG tablet Commonly known as:  COREG Take 1 tablet (  6.25 mg total) by mouth 2 (two) times daily with a meal. What changed:    medication strength  how much to take   cholecalciferol 1000 units tablet Commonly known as:  VITAMIN D Take 1,000 Units  by mouth daily.   diphenhydrAMINE 25 MG tablet Commonly known as:  BENADRYL Take 25 mg by mouth as needed for allergies.   doxazosin 8 MG tablet Commonly known as:  CARDURA Take 1 tablet (8 mg total) by mouth daily. Start taking on:  03/06/2018 What changed:  additional instructions   DULoxetine 30 MG capsule Commonly known as:  CYMBALTA Take 1 capsule (30 mg total) by mouth daily.   gabapentin 300 MG capsule Commonly known as:  NEURONTIN Take 300 mg by mouth at bedtime.   hydrALAZINE 100 MG tablet Commonly known as:  APRESOLINE Take 1 tablet (100 mg total) by mouth every 8 (eight) hours. What changed:  when to take this   isosorbide mononitrate 120 MG 24 hr tablet Commonly known as:  IMDUR Take 1 tablet (120 mg total) by mouth daily.   levothyroxine 112 MCG tablet Commonly known as:  SYNTHROID, LEVOTHROID Take 1 tablet (112 mcg total) by mouth daily before breakfast.   montelukast 10 MG tablet Commonly known as:  SINGULAIR Take 1 tablet (10 mg total) by mouth daily. What changed:  when to take this   ondansetron 4 MG tablet Commonly known as:  ZOFRAN Take 1 tablet (4 mg total) by mouth every 8 (eight) hours as needed for nausea or vomiting.   oxyCODONE 5 MG immediate release tablet Commonly known as:  Oxy IR/ROXICODONE Take 1 tablet (5 mg total) by mouth every 4 (four) hours as needed for severe pain. What changed:    when to take this  reasons to take this   pantoprazole 40 MG tablet Commonly known as:  PROTONIX Take 1 tablet (40 mg total) by mouth 2 (two) times daily before a meal. What changed:  when to take this   potassium chloride SA 20 MEQ tablet Commonly known as:  K-DUR,KLOR-CON Take 2 tablets (40 mEq total) by mouth daily.   sodium-potassium bicarbonate Tbef dissolvable tablet Commonly known as:  ALKA-SELTZER GOLD Take 4 tablets by mouth daily as needed (heartburn).   torsemide 20 MG tablet Commonly known as:  DEMADEX Take 4 tablets (80 mg  total) by mouth daily.   warfarin 2.5 MG tablet Commonly known as:  COUMADIN Take 1 tablet (2.5 mg total) by mouth daily at 6 PM.      Follow-up Information    [facility] Follow up on 03/09/2018.   Specialty:  Cardiology Why:  10:00AM, wound check visit Contact information: [address]       Constance Haw, MD Follow up on 06/01/2018.   Specialty:  Cardiology Why:  9:45AM Contact information: [address] [place] [address] [phone]        Rexene Alberts, MD Follow up.   Specialty:  Cardiothoracic Surgery Why:  Your routine follow-up appointment is on 03/16/2018 at 1:30 PM.  Please arrive at 1 PM for a chest x-ray located at [facility] imaging which is on the first floor of our building. Contact information: [address] [place] [address] [phone]        [facility] Follow up.   Specialty:  Cardiology Why:  Hospital follow up on  September 12th at 2:00 with an advanced practive provider.  Contact information: [address] [id number] [place]       [facility] Follow up.   Why:  Please call at discharge from SNF and set up appointment for PT/INR check for 48 hours after SNF discharge Contact information: [address] [place], [place] [address]  [phone]       SNF Follow up.   Why:  Please check PT and INR on 03/08/2018 and call [phone])MQ fax to Dr. Marlou Porch office         The patient has been discharged on:   1.Beta Blocker:  Yes [   ]                              No   [ x  ]                              If No, reason: heart block  2.Ace Inhibitor/ARB: Yes [   ]                                     No  [ x   ]                                     If No, reason: renal  failure  3.Statin:   Yes [  x ]                  No  [   ]                  If No, reason:  4.Ecasa:  Yes  [  x ]                  No   [   ]                  If No, reason:   Signed:  Original Note by Nicholes Rough PA-C  Addendum By: Nani Skillern PA-C 03/05/2018, 12:38 PM

## 2018-02-24 NOTE — Progress Notes (Signed)
Physical Therapy Treatment Patient Details Name: Jocelyn Hill MRN: [id number] DOB: September 18, 1950 Today's Date: 02/24/2018    History of Present Illness 67 yo admitted from heart failure clinic with chest pain and SOB. Pt with planned CABG/MVR/MAze for 8/29 but moved up to 8/22 PMHx: h/o mitral regurgitation, chronic diastolic CHF, CAD s/p MI, HTN, DM2, CKD IV-V, h/o Stroke, anxiety, depression and GERD    PT Comments    Pt in bed on arrival with nurse removing foley and IV to transport pt to new room. Nurse discussed possible heartblock w/ pacemaker being scheduled. Pt activity significantly limited secondary to dizzy/lightheadedness and c/o chest pain throughout session that did not resolve and increased with standing. Vitals taken, no orthostatic (see chart below). Noted BP increase with activity. Pt felt pressure in chest below incision and to left side of trunk. Pt returned to transport chair with nurse notified and in room prior to leaving. Orthostatic VS for the past 24 hrs (Last 3 readings):  BP- Lying Pulse- Lying BP- Sitting Pulse- Sitting BP- Standing at 0 minutes BP- Standing at 3 minutes Pulse- Standing at 3 minutes  02/24/18 1200 130/80 90 135/90 95 (!) 150/101 154/85 94        Follow Up Recommendations  Home health PT;Supervision/Assistance - 24 hour     Equipment Recommendations  None recommended by PT    Recommendations for Other Services       Precautions / Restrictions Precautions Precautions: Fall;Sternal Precaution Comments: pt able to recall keep arms in tube Restrictions Weight Bearing Restrictions: Yes(sternal precautions ) Other Position/Activity Restrictions: Limit RUE mvmt, blockage and bruising     Mobility  Bed Mobility Overal bed mobility: Needs Assistance Bed Mobility: Rolling;Sit to Sidelying Rolling: Min guard Sidelying to sit: Min assist;HOB elevated       General bed mobility comments: able to move LEs off bed today, min a to assist trunk  from sidelying to sit to adhere to sternal precautions   Transfers Overall transfer level: Needs assistance Equipment used: None Transfers: Sit to/from Stand Sit to Stand: Min guard         General transfer comment: guard for safety and steady   Ambulation/Gait Ambulation/Gait assistance: Min assist Gait Distance (Feet): 8 Feet Assistive device: 1 person hand held assist Gait Pattern/deviations: Step-to pattern;Decreased stride length Gait velocity: decreased Gait velocity interpretation: <1.8 ft/sec, indicate of risk for recurrent falls General Gait Details: LUE supported throughout gait, gait limited by dizziness and chest pains. Pt returned to transport chair, VSS, nurse brought into room and aware.    Stairs             Wheelchair Mobility    Modified Rankin (Stroke Patients Only)       Balance Overall balance assessment: Modified Independent   Sitting balance-Leahy Scale: Good Sitting balance - Comments: able to sit EOB without feet or UE support for 5 minutes w/ supervision. Lightheaded on sitting up, partially resolved w/ time    Standing balance support: Single extremity supported Standing balance-Leahy Scale: Poor Standing balance comment: Requiring Min A                            Cognition Arousal/Alertness: Awake/alert Behavior During Therapy: Flat affect Overall Cognitive Status: Impaired/Different from baseline Area of Impairment: Safety/judgement                         Safety/Judgement: Decreased awareness of deficits  General Comments: Distracted by pain and dizziness      Exercises      General Comments General comments (skin integrity, edema, etc.): VSS       Pertinent Vitals/Pain Faces Pain Scale: Hurts a little bit Pain Location: RUE and chest(chest pain below incision and left side) Pain Descriptors / Indicators: Pressure;Grimacing;Aching Pain Intervention(s): Limited activity within patient's  tolerance;Monitored during session;Repositioned    Home Living                      Prior Function            PT Goals (current goals can now be found in the care plan section) Progress towards PT goals: Not progressing toward goals - comment(pt limited today by chest pain and dizziness )    Frequency    Min 3X/week      PT Plan Current plan remains appropriate    Co-evaluation              AM-PAC PT "6 Clicks" Daily Activity  Outcome Measure  Difficulty turning over in bed (including adjusting bedclothes, sheets and blankets)?: Unable Difficulty moving from lying on back to sitting on the side of the bed? : Unable Difficulty sitting down on and standing up from a chair with arms (e.g., wheelchair, bedside commode, etc,.)?: Unable Help needed moving to and from a bed to chair (including a wheelchair)?: A Little Help needed walking in hospital room?: A Lot Help needed climbing 3-5 steps with a railing? : A Lot 6 Click Score: 10    End of Session Equipment Utilized During Treatment: Gait belt Activity Tolerance: Patient limited by fatigue;Patient limited by pain;Other (comment)(limited by dizziness and chest pain) Patient left: with nursing/sitter in room;Other (comment)(in transport chair ) Nurse Communication: Mobility status PT Visit Diagnosis: Other abnormalities of gait and mobility (R26.89);Muscle weakness (generalized) (M62.81)     Time: 1055-1130 PT Time Calculation (min) (ACUTE ONLY): 35 min  Charges:  $Gait Training: 8-22 mins $Therapeutic Activity: 8-22 mins                     Samuella Bruin, [place]  Acute Rehab [phone]    Samuella Bruin 02/24/2018, 2:02 PM

## 2018-02-24 NOTE — Progress Notes (Signed)
Patient arrived from [facility] at this time. V/S and assessment done. Telemetry applied. CCMD notified. 2nd verifier present. Pacing wires attached and on. Patient oriented to room and how to call the nurse with any needs.  Emelda Fear, RN

## 2018-02-24 NOTE — Plan of Care (Signed)
  Problem: Activity: Goal: Risk for activity intolerance will decrease Outcome: Progressing   Problem: Cardiac: Goal: Will achieve and/or maintain hemodynamic stability Outcome: Progressing   Problem: Clinical Measurements: Goal: Postoperative complications will be avoided or minimized Outcome: Progressing   Problem: Respiratory: Goal: Respiratory status will improve Outcome: Progressing   Problem: Skin Integrity: Goal: Wound healing without signs and symptoms of infection Outcome: Progressing   Problem: Urinary Elimination: Goal: Ability to achieve and maintain adequate renal perfusion and functioning will improve Outcome: Progressing

## 2018-02-24 NOTE — Progress Notes (Addendum)
Patient ID: SHANIAH BALTES, female   DOB: 1950/11/04, 67 y.o.   MRN: [id number]   Progress Note  Patient Name: ADIBA FARGNOLI Date of Encounter: 02/24/2018  Primary Cardiologist: No primary care provider on file.   Subjective   Remains in [facility]. Making over 2 liters of urine.   - Echo (8/26): EF 45-50%, global HK, bioprosthetic MV functioning normally, PFO closed.   02/18/2018  Mitral Valve Replacement Edwards Magna Mitral Bovine Bioprosthetic Tissue Valve (size4mm, model #7300TFX, serial #[id number])   [facility] Procedure complete bilateral atrial lesion set using bipolar radiofrequency and cryothermy ablation clipping of left atrial appendage (Atricure Pro245 left atrial clipsize 85mm)   Coronary Artery Bypass Grafting x2 Left Internal Mammary Artery to Distal Left Anterior Descending Coronary Artery Saphenous Vein Graft to DistalLeft Circumflex Coronary Artery Endoscopic Vein Harvest from RightThigh    Closure of Patent Foramen Ovale  Inpatient Medications    Scheduled Meds: . amLODipine  10 mg Oral Daily  . aspirin EC  325 mg Oral Daily  . atorvastatin  80 mg Oral Daily  . buPROPion  100 mg Oral TID AC  . Chlorhexidine Gluconate Cloth  6 each Topical Daily  . Chlorhexidine Gluconate Cloth  6 each Topical Q0600  . doxazosin  4 mg Oral Daily  . enoxaparin (LOVENOX) injection  30 mg Subcutaneous QHS  . feeding supplement  1 Container Oral TID BM  . ferrous [id number]-UMPNTIR C-folic acid  1 capsule Oral TID PC  . hydrALAZINE  100 mg Oral Q8H  . isosorbide mononitrate  120 mg Oral Daily  . levothyroxine  112 mcg Oral QAC breakfast  . mouth rinse  15 mL Mouth Rinse BID  . montelukast  10 mg Oral Daily  . moving right along book   Does not apply Once  . pantoprazole  40 mg Oral Daily   Continuous Infusions: . sodium chloride    . sodium chloride    . furosemide Stopped (02/24/18 4431)    . lactated ringers Stopped (02/23/18 0738)  . potassium chloride Stopped (02/23/18 0838)   PRN Meds: sodium chloride, sodium chloride, ALPRAZolam, alteplase, calcium carbonate, heparin, lidocaine (PF), lidocaine-prilocaine, metoprolol tartrate, morphine injection, ondansetron (ZOFRAN) IV, oxyCODONE, pentafluoroprop-tetrafluoroeth, potassium chloride, sodium chloride flush, sodium chloride flush, traMADol   Vital Signs    Vitals:   02/24/18 0400 02/24/18 0500 02/24/18 0600 02/24/18 0700  BP: 135/84 (!) 144/87 (!) 152/85 139/78  Pulse: 84 85 88 87  Resp: 14 16 13  (!) 24  Temp:      TempSrc:      SpO2: 100% 100% 91% 98%  Weight:  60.3 kg    Height:        Intake/Output Summary (Last 24 hours) at 02/24/2018 0740 Last data filed at 02/24/2018 0611 Gross per 24 hour  Intake 1854.92 ml  Output 2120 ml  Net -265.08 ml   Filed Weights   02/22/18 0909 02/23/18 0700 02/24/18 0500  Weight: 61 kg 60.9 kg 60.3 kg    Telemetry    Currently NSR with V-pacing (personally reviewed).  When pacemaker turned off, she still appears to have high grade heart block with rate in 60s.   ECG  Pacing 91.  Per Dr Roxy Manns remains in [place].   Physical Exam  General:  No resp difficulty HEENT: normal Neck: supple. no JVD. Carotids 2+ bilat; no bruits. No lymphadenopathy or thryomegaly appreciated. Cor: PMI nondisplaced. Regular rate & rhythm. No rubs, gallops or murmurs. RIJ LIJ Lungs: clear Abdomen: soft, nontender,  nondistended. No hepatosplenomegaly. No bruits or masses. Good bowel sounds. Extremities: no cyanosis, clubbing, rash, edema. RUE tender.  Neuro: alert & orientedx3, cranial nerves grossly intact. moves all 4 extremities w/o difficulty. Affect pleasant   Labs    Chemistry Recent Labs  Lab 02/22/18 0426 02/23/18 0446 02/24/18 0437  NA 129* 132* 137  K 3.4* 3.5 3.4*  CL 93* 96* 100  CO2 20* 24 25  GLUCOSE 122* 244* 114*  BUN 91* 57* 61*  CREATININE 5.53* 4.01* 4.20*  CALCIUM  8.0* 8.1* 8.4*  GFRNONAA 7* 11* 10*  GFRAA 8* 12* 12*  ANIONGAP 16* 12 12     Hematology Recent Labs  Lab 02/21/18 0355 02/22/18 0426 02/24/18 0437  WBC 17.8* 12.5* 8.9  RBC 3.30* 3.16* 3.21*  HGB 9.3* 8.8* 8.9*  HCT 26.9* 25.7* 26.1*  MCV 81.5 81.3 81.3  MCH 28.2 27.8 27.7  MCHC 34.6 34.2 34.1  RDW 15.1 14.8 14.6  PLT 89* 120* 208    Cardiac Enzymes No results for input(s): TROPONINI in the last 168 hours. No results for input(s): TROPIPOC in the last 168 hours.   BNP No results for input(s): BNP, PROBNP in the last 168 hours.   DDimer No results for input(s): DDIMER in the last 168 hours.   Radiology    No results found.  Cardiac Studies   Cath 07/03/2017 1. Low filling pressure and preserved cardiac output.  2. 90% ostial LCx stenosis.  3. Napkin ring-like stenosis in the proximal LAD, at least 60% stenosis.   ECHO 02/10/2018 - Left ventricle: The cavity size was mildly dilated. There was moderate concentric hypertrophy. Systolic function was normal. The estimated ejection fraction was in the range of 55% to 60%. Wall motion was normal; there were no regional wall motion abnormalities. Features are consistent with a pseudonormal left ventricular filling pattern, with concomitant abnormal relaxation and increased filling pressure (grade 2 diastolic dysfunction). Doppler parameters are consistent with high ventricular filling pressure. - Mitral valve: Mild thickening and calcification of the anterior leaflet, consistent with rheumatic disease. Mobility was restricted. Diastolic leaflet doming was present. The findings are consistent with trivial stenosis with mean MV gradient of 83mmHg. There was moderate regurgitation. Effective regurgitant orifice (PISA): 0.17 cm^2. Regurgitant volume (PISA): 24 ml. - Left atrium: The atrium was moderately dilated. - Tricuspid valve: There was mild regurgitation. - Pulmonary arteries: PA peak  pressure: 39 mm Hg (S). - Pericardium, extracardiac: A trivial pericardial effusion was identified posterior to the heart.  Impressions:  - Mildly dilated LV with moderate LVF and normal LVF with EF 55-60%. There is moderate LAE, mild TR and mild pulmonary HTN with PASP 71mmHg. The MV appears rheumatic with thickening and diastolic doming. There appears to be at least moderate MR with ERO of 0.17cm2 and regurgitant volume of 24cc. The right ventricular systolic pressure was increased consistent with mild pulmonary hypertension.   TEE intraop 02/18/2018  Right ventricle: Right ventricular cavity was normal in size. There was normal RV systolic function.  Tricuspid valve: The tricuspid valve appeared structurally normal. There was trace tricuspid insufficiency.  Pulmonic valve: The pulmonic valve appeared structurally normal. There was trace pulmonic insufficiency.  Mitral valve: The mitral leaflets were thickened but appeared to open without restriction. There was severe mitral regurgitation with a central jet. The ERO by the PISA method was 0.5 cm. The regurgitant volume was 70 cc. There were no flail or prolapsing leaflet segments. On the post bypass exam, there was a bioprosthetic  valve in the mitral position. The leaflets opened normally. There was a central jet of mitral insufficiency that was mild. After careful interrogation of the sewing  Right atrium: Cavity is mildly dilated.  Patient Profile     67 y.o. female with severe 2 vessel CAD, severe MR, preserved LVEF, paroxysmal atrial fibrillation, CKD stage 4, severe HTN now s/p CABG/MVR/MAZE on 08/22, had postop complete heart block, subsequently second-degree AV block Mobitz type I  Assessment & Plan    1. CAD s/p CABG: has surgical pain.  LBBB is pre-existing. - Continue ASA and statin.  2. 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  3. Heart block: She  currently is in NSR with V-pacing from pacing wires.  When pacing turned down, she remains in heart block with PVCs => this appears to be high grade HB.  - Avoid nodal blockers.  - EP following. May end up needing a PPM, with low EF at 45-50% may benefit from BiV pacing.  Higher risk of infection with dialysis catheter in place and possible need for permanent HD. Will watch a few more days with temporary pacing wires in place to look for recovery (doubt as she had conduction abnormalities pre-op).  4. AFib s/p MAZE: no afib seen on monitor since surgery - Restarting coumadin today per CT surgery.    5. HTN: SBP 130s-150, no med changes today.    - Continue doxazosin to 4 mg daily.  - Continue hydralazine and amlodipine.  6. Acute on chronic stage 4 renal insufficiency:  Had HD 8/26 but UOP has since improved. Making over 2 liters of urine. UBN coming down and creatinine plateaued. - Removing temporary HD catheter today.  7. L apical pneumothorax: For now monitoring w/o chest tube.  8. Acute on chronic primarily diastolic CHF: Weight down 2 lbs. Still up considerably from pre-op.  Echo 8/26 with EF 45-50%.  CVP 7 today.  - Lasix 120 mg IV bid as she continues to have good UOP, probably to po torsemide tomorrow.  - No HD for now with good UOP.  9. Anemia: Post-op and renal disease.  Hgb stable 8.9  10. Right arm pain: Had PICC in right arm, [place] No DVT but with acute superficial vein thrombosis in the cephalic vein.    Lines coming out today per Dr Roxy Manns.    For questions or updates, please contact [place] Please consult [email or web address] for contact info under Cardiology/STEMI.      SignedDarrick Grinder, NP  02/24/2018, 7:40 AM    Patient seen with NP, agree with the above note.   She is making progress.  CVP 7, >2L UOP on Lasix 120 mg IV bid.  SBP generally 130s-140s on current meds.  Doing some walking.  Still sore in chest and right arm (has superficial thrombosis antecubital vein).    On exam, regular rhythm with widely split S2, no murmur, no edema.   Continue Lasix 120 mg IV bid today, probably to po torsemide tomorrow.  Creatinine at a plateau, BUN coming down.  Will remove temporary HD catheter today.  Hopefully can avoid further HD.   She still appears to be in high grade heart block.  Will continue to follow with surgical pacing wire in place, currently a-sensed/V-paced.  May need a PPM but will wait until closer to discharge.   No atrial fibrillation since Maze.  Start warfarin today.   Continue to mobilize, to step-down.   [name] 02/24/2018  8:13 AM

## 2018-02-25 ENCOUNTER — Inpatient Hospital Stay (HOSPITAL_COMMUNITY): Payer: Medicare Other

## 2018-02-25 LAB — COMPREHENSIVE METABOLIC PANEL
ALT: 31 U/L (ref 0–44)
AST: 35 U/L (ref 15–41)
Albumin: 2.6 g/dL — ABNORMAL LOW (ref 3.5–5.0)
Alkaline Phosphatase: 79 U/L (ref 38–126)
Anion gap: 12 (ref 5–15)
BUN: 60 mg/dL — ABNORMAL HIGH (ref 8–23)
CO2: 25 mmol/L (ref 22–32)
Calcium: 8.8 mg/dL — ABNORMAL LOW (ref 8.9–10.3)
Chloride: 99 mmol/L (ref 98–111)
Creatinine, Ser: 4.28 mg/dL — ABNORMAL HIGH (ref 0.44–1.00)
GFR calc Af Amer: 11 mL/min — ABNORMAL LOW (ref >60)
GFR calc non Af Amer: 10 mL/min — ABNORMAL LOW (ref >60)
Glucose, Bld: 149 mg/dL — ABNORMAL HIGH (ref 70–99)
Potassium: 3.1 mmol/L — ABNORMAL LOW (ref 3.5–5.1)
Sodium: 136 mmol/L (ref 135–145)
Total Bilirubin: 0.5 mg/dL (ref 0.3–1.2)
Total Protein: 5.5 g/dL — ABNORMAL LOW (ref 6.5–8.1)

## 2018-02-25 LAB — PROTIME-INR
INR: 1.16
Prothrombin Time: 14.7 seconds (ref 11.4–15.2)

## 2018-02-25 MED ORDER — DOXAZOSIN MESYLATE 2 MG PO TABS
8.0000 mg | ORAL_TABLET | Freq: Every day | ORAL | Status: DC
  Administered 2018-02-26 – 2018-03-05 (×8): 8 mg via ORAL
  Filled 2018-02-25 (×8): qty 4

## 2018-02-25 MED ORDER — FUROSEMIDE 10 MG/ML IJ SOLN
120.0000 mg | Freq: Two times a day (BID) | INTRAVENOUS | Status: AC
  Administered 2018-02-25: 120 mg via INTRAVENOUS
  Filled 2018-02-25: qty 2

## 2018-02-25 MED ORDER — POTASSIUM CHLORIDE CRYS ER 20 MEQ PO TBCR
40.0000 meq | EXTENDED_RELEASE_TABLET | Freq: Once | ORAL | Status: AC
  Administered 2018-02-25: 40 meq via ORAL
  Filled 2018-02-25: qty 2

## 2018-02-25 NOTE — Progress Notes (Signed)
1345-1400 We came to see pt and walk. Pt declined due to chest and right arm hurting. Stated she had walked to bathroom earlier. Discussed with pt the importance of walking. Gave IS and she demonstrated about 1000 ml correctly. Encouraged her to walk with staff later when feeling better. Graylon Good RN BSN 02/25/2018 2:01 PM

## 2018-02-25 NOTE — Progress Notes (Addendum)
[address]       [place] [address]             [phone]      7 Days Post-Op Procedure(s) (LRB): MITRAL VALVE (MV) REPLACEMENT (N/A) CORONARY ARTERY BYPASS GRAFTING (CABG) WITH IMA. ENDOSCOPIC VEIN HARVEST. IMA TO LAD, SVG TO CIRC. (N/A) MAZE (N/A) TRANSESOPHAGEAL ECHOCARDIOGRAM (TEE) (N/A) PLACEMENT OF TEMPORARY HD CATHETER, POWER TRIALYSIS 13FR 20CM (N/A) PATENT FORAMEN OVALE (PFO) CLOSURE (N/A) Subjective: Shares that her right arm is numb and feels like its on pins and needles.   Objective: Vital signs in last 24 hours: Temp:  [97.6 F (36.4 C)-98.9 F (37.2 C)] 98.9 F (37.2 C) (08/29 0418) Pulse Rate:  [83-99] 99 (08/29 0418) Cardiac Rhythm: A-V Sequential paced (08/29 0300) Resp:  [11-25] 14 (08/29 0418) BP: (117-162)/(80-95) 138/86 (08/29 0418) SpO2:  [94 %-100 %] 97 % (08/29 0418) Weight:  [58.9 kg] 58.9 kg (08/29 0246)  Hemodynamic parameters for last 24 hours: CVP:  [7 mmHg-12 mmHg] 12 mmHg  Intake/Output from previous day: 08/28 0701 - 08/29 0700 In: 278.1 [P.O.:240; IV Piggyback:38.1] Out: 2150 [Urine:2150] Intake/Output this shift: Total I/O In: 240 [P.O.:240] Out: 400 [Urine:400]  General appearance: alert, cooperative and no distress Heart: regular rate and rhythm, S1, S2 normal, no murmur, click, rub or gallop and paced  Lungs: clear to auscultation bilaterally Abdomen: soft, non-tender; bowel sounds normal; no masses,  no organomegaly Extremities: extremities normal, atraumatic, no cyanosis or edema Wound: clean and dry  Lab Results: Recent Labs    02/24/18 0437  WBC 8.9  HGB 8.9*  HCT 26.1*  PLT 208   BMET:  Recent Labs    02/24/18 0437 02/25/18 0500  NA 137 136  K 3.4* 3.1*  CL 100 99  CO2 25 25  GLUCOSE 114* 149*  BUN 61* 60*  CREATININE 4.20* 4.28*  CALCIUM 8.4* 8.8*    PT/INR:  Recent Labs    02/25/18 0500  LABPROT 14.7  INR 1.16   ABG    Component Value Date/Time   PHART 7.297 (L)  02/19/2018 1053   HCO3 21.3 02/19/2018 1053   TCO2 23 02/19/2018 1702   ACIDBASEDEF 5.0 (H) 02/19/2018 1053   O2SAT 97.0 02/19/2018 1053   CBG (last 3)  No results for input(s): GLUCAP in the last 72 hours.  Assessment/Plan: S/P Procedure(s) (LRB): MITRAL VALVE (MV) REPLACEMENT (N/A) CORONARY ARTERY BYPASS GRAFTING (CABG) WITH IMA. ENDOSCOPIC VEIN HARVEST. IMA TO LAD, SVG TO CIRC. (N/A) MAZE (N/A) TRANSESOPHAGEAL ECHOCARDIOGRAM (TEE) (N/A) PLACEMENT OF TEMPORARY HD CATHETER, POWER TRIALYSIS 13FR 20CM (N/A) PATENT FORAMEN OVALE (PFO) CLOSURE (N/A)  1. CV-NSR in the 90s-100s She is being AV paced. Under the pacer she is SB in the 70s. EP following and assisting, BP well controlled. Continue Norvasc, ASA, statin, and low-dose coumadin 2. Pulm-Tolerating room air with good oxygen saturation. Continue Singulair.  3. Renal-On 120mg  Lasix BID. Acute on chronic kidney disease. Hypokalemia replacement per renal 4. Continue low-dose coumadin. INR 1.16 5. H and H has been stable, expected acute blood loss anemia 6.  Diet-currently tolerating clears and would like to be advanced.  7. Continue to work with PT/OT for increased strength and mobility  Plan: Diet per renal, Continues to make slow and steady progress. Ambulate in the halls today. Left arm appears less edematous this morning-keep elevated. Encouraged incentive spirometer use.    LOS: 16 days    Elgie Collard 02/25/2018   I have  seen and examined the patient and agree with the assessment and plan as outlined.  Still in complete heart block.  Escape HR 50-60.  Otherwise making slow but steady progress and renal function remains remarkably stable.  Rexene Alberts, MD 02/25/2018 5:51 PM

## 2018-02-25 NOTE — Progress Notes (Addendum)
Patient ID: Jocelyn Hill, female   DOB: 07-30-1950, 67 y.o.   MRN: [id number]   Progress Note  Patient Name: RADHA COGGINS Date of Encounter: 02/25/2018  Primary Cardiologist: No primary care provider on file.   Subjective   Underlying rhythm is now SB 40s per TCTS. Had another 2 L UOP yesterday. Weight down 3 lbs. Creatinine 4.2 > 4.28. K 3.1  Coumadin restarted yesterday. INR 1.16  Denies SOB with activity. She gets SOB when her room is hot. Denies CP. Having regular bowel movements. Pain well controlled. Afebrile. She is on clear liquid diet still.   - Echo (8/26): EF 45-50%, global HK, bioprosthetic MV functioning normally, PFO closed.   02/18/2018  Mitral Valve Replacement Edwards Magna Mitral Bovine Bioprosthetic Tissue Valve (size12mm, model #7300TFX, serial #[id number])   Maze Procedure complete bilateral atrial lesion set using bipolar radiofrequency and cryothermy ablation clipping of left atrial appendage (Atricure Pro245 left atrial clipsize 72mm)   Coronary Artery Bypass Grafting x2 Left Internal Mammary Artery to Distal Left Anterior Descending Coronary Artery Saphenous Vein Graft to DistalLeft Circumflex Coronary Artery Endoscopic Vein Harvest from RightThigh    Closure of Patent Foramen Ovale  Inpatient Medications    Scheduled Meds: . amLODipine  10 mg Oral Daily  . aspirin EC  325 mg Oral Daily  . atorvastatin  80 mg Oral Daily  . buPROPion  100 mg Oral TID AC  . Chlorhexidine Gluconate Cloth  6 each Topical Daily  . Chlorhexidine Gluconate Cloth  6 each Topical Q0600  . doxazosin  4 mg Oral Daily  . enoxaparin (LOVENOX) injection  30 mg Subcutaneous QHS  . feeding supplement  1 Container Oral TID BM  . ferrous [id number] C-folic acid  1 capsule Oral TID PC  . hydrALAZINE  100 mg Oral Q8H  . isosorbide mononitrate  120 mg Oral Daily  . levothyroxine  112  mcg Oral QAC breakfast  . mouth rinse  15 mL Mouth Rinse BID  . montelukast  10 mg Oral Daily  . pantoprazole  40 mg Oral Daily  . warfarin  1 mg Oral q1800  . Warfarin - Physician Dosing Inpatient   Does not apply q1800   Continuous Infusions: . sodium chloride    . sodium chloride    . furosemide 120 mg (02/25/18 1829)  . lactated ringers Stopped (02/23/18 0738)  . potassium chloride Stopped (02/23/18 0838)   PRN Meds: sodium chloride, sodium chloride, ALPRAZolam, alteplase, calcium carbonate, heparin, lidocaine (PF), lidocaine-prilocaine, metoprolol tartrate, morphine injection, ondansetron (ZOFRAN) IV, oxyCODONE, pentafluoroprop-tetrafluoroeth, potassium chloride, sodium chloride flush, sodium chloride flush, traMADol   Vital Signs    Vitals:   02/24/18 2100 02/25/18 0032 02/25/18 0246 02/25/18 0418  BP:  (!) 162/89  138/86  Pulse:  95  99  Resp:  11  14  Temp: 98.8 F (37.1 C)   98.9 F (37.2 C)  TempSrc: Oral   Oral  SpO2: 96%   97%  Weight:   58.9 kg   Height:        Intake/Output Summary (Last 24 hours) at 02/25/2018 0730 Last data filed at 02/25/2018 9371 Gross per 24 hour  Intake 518.13 ml  Output 2550 ml  Net -2031.87 ml   Filed Weights   02/23/18 0700 02/24/18 0500 02/25/18 0246  Weight: 60.9 kg 60.3 kg 58.9 kg    Telemetry    Vpaced 90s. Personally reviewed.   ECG   No new tracings.   Physical Exam  General: Well appearing. No resp difficulty. HEENT: Normal Neck: Supple. No JVD, but difficult to assess with left IJ and dressing to right neck. Carotids 2+ bilat; no bruits. No thyromegaly or nodule noted. Cor: PMI nondisplaced. RRR, split S2 Lungs: CTAB, normal effort. Abdomen: Soft, non-tender, non-distended, no HSM. No bruits or masses. +BS  Extremities: No cyanosis, clubbing, or rash. R and LLE no edema.  Neuro: Alert & orientedx3, cranial nerves grossly intact. moves all 4 extremities w/o difficulty. Affect pleasant   Labs      Chemistry Recent Labs  Lab 02/23/18 0446 02/24/18 0437 02/25/18 0500  NA 132* 137 136  K 3.5 3.4* 3.1*  CL 96* 100 99  CO2 24 25 25   GLUCOSE 244* 114* 149*  BUN 57* 61* 60*  CREATININE 4.01* 4.20* 4.28*  CALCIUM 8.1* 8.4* 8.8*  PROT  --   --  5.5*  ALBUMIN  --   --  2.6*  AST  --   --  35  ALT  --   --  31  ALKPHOS  --   --  79  BILITOT  --   --  0.5  GFRNONAA 11* 10* 10*  GFRAA 12* 12* 11*  ANIONGAP 12 12 12      Hematology Recent Labs  Lab 02/21/18 0355 02/22/18 0426 02/24/18 0437  WBC 17.8* 12.5* 8.9  RBC 3.30* 3.16* 3.21*  HGB 9.3* 8.8* 8.9*  HCT 26.9* 25.7* 26.1*  MCV 81.5 81.3 81.3  MCH 28.2 27.8 27.7  MCHC 34.6 34.2 34.1  RDW 15.1 14.8 14.6  PLT 89* 120* 208    Cardiac Enzymes No results for input(s): TROPONINI in the last 168 hours. No results for input(s): TROPIPOC in the last 168 hours.   BNP No results for input(s): BNP, PROBNP in the last 168 hours.   DDimer No results for input(s): DDIMER in the last 168 hours.   Radiology    No results found.  Cardiac Studies   Cath 07/03/2017 1. Low filling pressure and preserved cardiac output.  2. 90% ostial LCx stenosis.  3. Napkin ring-like stenosis in the proximal LAD, at least 60% stenosis.   ECHO 02/10/2018 - Left ventricle: The cavity size was mildly dilated. There was moderate concentric hypertrophy. Systolic function was normal. The estimated ejection fraction was in the range of 55% to 60%. Wall motion was normal; there were no regional wall motion abnormalities. Features are consistent with a pseudonormal left ventricular filling pattern, with concomitant abnormal relaxation and increased filling pressure (grade 2 diastolic dysfunction). Doppler parameters are consistent with high ventricular filling pressure. - Mitral valve: Mild thickening and calcification of the anterior leaflet, consistent with rheumatic disease. Mobility was restricted. Diastolic leaflet  doming was present. The findings are consistent with trivial stenosis with mean MV gradient of 73mmHg. There was moderate regurgitation. Effective regurgitant orifice (PISA): 0.17 cm^2. Regurgitant volume (PISA): 24 ml. - Left atrium: The atrium was moderately dilated. - Tricuspid valve: There was mild regurgitation. - Pulmonary arteries: PA peak pressure: 39 mm Hg (S). - Pericardium, extracardiac: A trivial pericardial effusion was identified posterior to the heart.  Impressions:  - Mildly dilated LV with moderate LVF and normal LVF with EF 55-60%. There is moderate LAE, mild TR and mild pulmonary HTN with PASP 69mmHg. The MV appears rheumatic with thickening and diastolic doming. There appears to be at least moderate MR with ERO of 0.17cm2 and regurgitant volume of 24cc. The right ventricular systolic pressure was increased consistent with mild pulmonary hypertension.  TEE intraop 02/18/2018  Right ventricle: Right ventricular cavity was normal in size. There was normal RV systolic function.  Tricuspid valve: The tricuspid valve appeared structurally normal. There was trace tricuspid insufficiency.  Pulmonic valve: The pulmonic valve appeared structurally normal. There was trace pulmonic insufficiency.  Mitral valve: The mitral leaflets were thickened but appeared to open without restriction. There was severe mitral regurgitation with a central jet. The ERO by the PISA method was 0.5 cm. The regurgitant volume was 70 cc. There were no flail or prolapsing leaflet segments. On the post bypass exam, there was a bioprosthetic valve in the mitral position. The leaflets opened normally. There was a central jet of mitral insufficiency that was mild. After careful interrogation of the sewing  Right atrium: Cavity is mildly dilated.  Patient Profile     67 y.o. female with severe 2 vessel CAD, severe MR, preserved LVEF, paroxysmal atrial fibrillation, CKD stage 4,  severe HTN now s/p CABG/MVR/MAZE on 08/22, had postop complete heart block, subsequently second-degree AV block Mobitz type I  Assessment & Plan    1. CAD s/p CABG: has surgical pain.  LBBB is pre-existing. - Continue ASA and statin. No s/s ischemia.  2. 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 No change.  3. Heart block: She currently is in NSR with V-pacing from pacing wires.  When pacing turned down, she remains in heart block with PVCs => this appears to be high grade HB.  - Avoid nodal blockers.  - EP following. May end up needing a PPM, with low EF at 45-50% may benefit from BiV pacing.  Higher risk of infection with dialysis catheter in place and possible need for permanent HD. Will watch a few more days with temporary pacing wires in place to look for recovery (doubt as she had conduction abnormalities pre-op).  4. AFib s/p MAZE: no afib seen on monitor since surgery - Coumadin restarted yesterday. INR 1.16  5. HTN: SBP generally 130s.    - Continue doxazosin 4 mg daily.  - Continue hydralazine and amlodipine.  6. Acute on chronic stage 4 renal insufficiency:  Had HD 8/26 but UOP has since improved. Making over 2 liters of urine. Creatinine up a little 4.2 > 4.28, BUN 61 > 60 - Temp HD cath removed yesterday.  7. L apical pneumothorax: For now monitoring w/o chest tube. No change.  8. Acute on chronic primarily diastolic CHF: Weight down 3 lbs. Still up about 3 lbs from pre-op.  Echo 8/26 with EF 45-50%.  - Volume stable on exam.  - Lasix 120 mg IV bid as she continues to have good UOP, probably to po torsemide tomorrow. Will defer to renal.  - No HD for now with good UOP.  9. Anemia: Post-op and renal disease.  Hgb stable 8.9 10. Right arm pain: Had PICC in right arm, [place] No DVT but with acute superficial vein thrombosis in the cephalic vein.  No change.  11. Hypokalemia - K 3.1. Supp cautiously with CKD.    Georgiana Shore, NP    02/25/2018, 7:30 AM     Advanced Heart Failure Team Pager [phone] (M-F; [place])  Please contact [facility] for night-coverage after hours (4p -7a ) and weekends on [email or web address]  Patient seen with NP, agree with the above note.   She is making progress.  Still good UOP with IV Lasix, weight down another 3 lbs, getting close to baseline weight.  BUN/creatinine  stable.  SBP 130s-150s on current meds.  Doing some walking.  Still sore in chest and right arm (has superficial thrombosis antecubital vein).   Underlying rhythm was assessed, she remains in high grade heart block but has a narrow escape rhythm with rate in 50s.   On exam, regular rhythm with widely split S2, no murmur, no edema.   Continue Lasix 120 mg IV bid today, will transition to her home po torsemide tomorrow.  Creatinine stable.  Hopefully can avoid further HD.   She still appears to be in high grade heart block.  Will continue to follow with surgical pacing wire in place, currently a-sensed/V-paced. She has a stable narrow complex escape rhythm with rate in 50s, suggesting a relatively high site for conduction block.  May be able to avoid pacemaker.   No atrial fibrillation since Maze.  Continue warfarin.    Continue to mobilize.   [name] 02/25/2018 11:23 AM

## 2018-02-25 NOTE — Progress Notes (Signed)
Patient ID: Jocelyn Hill, female   DOB: 04/17/1951, 67 y.o.   MRN: [id number] [facility] Progress Note   Assessment/ Plan:   1. AKI on chronic kidney disease stage IV/V: Renal function remains relatively stable with a creatinine of around 4.2/stable BUN and decent urine output with consistent net negative fluid balance on current doses of furosemide.  She does not have any acute indications for hemodialysis at this time and the plan is to transition to oral diuretic therapy tomorrow to mimic outpatient regimen. 2.  Hyponatremia: Sodium levels corrected with hemodialysis/diuretic therapy. 3.  Right arm swelling: With prior history of PICC line on the side-ultrasound without evidence of subclavian DVT but with high clinical suspicion for antecubital thrombus. 4.  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 5.  Anemia: Hemoglobin/hematocrit remained stable-continue ESA.  Iron stores previously checked found to be replete. 6.  Hypokalemia: Secondary to diuretic induced losses, replace via oral route.  Subjective:   Transferred to telemetry bed overnight-denies any acute events and continues to have intermittent discomfort of her right arm/chest wall over surgical site.  Fatigued this morning and did not want to ambulate.   Objective:   BP (!) 150/82   Pulse 99   Temp 98.9 F (37.2 C) (Oral)   Resp 14   Ht 5\' 9"  (1.753 m)   Wt 58.9 kg   SpO2 97%   BMI 19.18 kg/m   Intake/Output Summary (Last 24 hours) at 02/25/2018 1132 Last data filed at 02/25/2018 0702 Gross per 24 hour  Intake 278.13 ml  Output 1750 ml  Net -1471.87 ml   Weight change: -1.4 kg  Physical Exam: Gen: Comfortably resting in bed, nurse by bedside. CVS: Pulse regular rhythm, normal rate, S1 and S2 without any murmurs or rubs Resp: Poor inspiratory effort, decreased  breath sounds over bases, no rales/rhonchi Abd: Soft, flat, nontender Ext: No pretibial edema appreciated.  Right upper arm swelling noted.  Imaging: No results found.  Labs: BMET Recent Labs  Lab 02/19/18 0341  02/19/18 1702 02/20/18 0339 02/21/18 0355 02/22/18 0426 02/23/18 0446 02/24/18 0437 02/25/18 0500  NA 139  --  139 137 134* 129* 132* 137 136  K 4.0  --  3.9 4.2 3.9 3.4* 3.5 3.4* 3.1*  CL 106  --  102 102 98 93* 96* 100 99  CO2 25  --   --  21* 20* 20* 24 25 25   GLUCOSE 148*  --  207* 187* 140* 122* 244* 114* 149*  BUN 56*  --  53* 67* 84* 91* 57* 61* 60*  CREATININE 4.40*   < > 5.10* 5.09* 5.31* 5.53* 4.01* 4.20* 4.28*  CALCIUM 9.1  --   --  9.1 8.4* 8.0* 8.1* 8.4* 8.8*   < > = values in this interval not displayed.   CBC Recent Labs  Lab 02/18/18 2104  02/20/18 0339 02/21/18 0355 02/22/18 0426 02/24/18 0437  WBC 17.7*   < > 18.8* 17.8* 12.5* 8.9  NEUTROABS 16.0*  --   --   --   --   --   HGB 11.9*   < > 10.2* 9.3* 8.8* 8.9*  HCT 35.6*   < > 30.9* 26.9* 25.7* 26.1*  MCV 82.4   < > 83.1 81.5 81.3 81.3  PLT 141*   < > 79* 89* 120* 208   < > = values in this interval  not displayed.    Medications:    . amLODipine  10 mg Oral Daily  . aspirin EC  325 mg Oral Daily  . atorvastatin  80 mg Oral Daily  . buPROPion  100 mg Oral TID AC  . Chlorhexidine Gluconate Cloth  6 each Topical Daily  . Chlorhexidine Gluconate Cloth  6 each Topical Q0600  . [START ON 02/26/2018] doxazosin  8 mg Oral Daily  . enoxaparin (LOVENOX) injection  30 mg Subcutaneous QHS  . feeding supplement  1 Container Oral TID BM  . ferrous [id number] C-folic acid  1 capsule Oral TID PC  . hydrALAZINE  100 mg Oral Q8H  . isosorbide mononitrate  120 mg Oral Daily  . levothyroxine  112 mcg Oral QAC breakfast  . mouth rinse  15 mL Mouth Rinse BID  . montelukast  10 mg Oral Daily  . pantoprazole  40 mg Oral Daily  . warfarin  1 mg Oral q1800  . Warfarin - Physician Dosing  Inpatient   Does not apply [id number]   Elmarie Shiley, MD 02/25/2018, 11:32 AM

## 2018-02-25 NOTE — Progress Notes (Signed)
Occupational Therapy Treatment Patient Details Name: Jocelyn Hill MRN: [id number] DOB: 1950-12-16 Today's Date: 02/25/2018    History of present illness 67 yo admitted from heart failure clinic with chest pain and SOB. Pt with planned CABG/MVR/MAze for 8/29 but moved up to 8/22 PMHx: h/o mitral regurgitation, chronic diastolic CHF, CAD s/p MI, HTN, DM2, CKD IV-V, h/o Stroke, anxiety, depression and GERD   OT comments  Pt continues to progress slowly towards established goals.  Able to complete bed mobility with min guard assistance for safety, toilet transfers with minA to [facility], toileting with min guard assist and grooming with setup assistance.  She currently is limited by pain, decreased activity tolerance, and impaired balance, reports significant fatigue and becomes nauseated after oral care (RN aware and present upon therapist exit). Good recall of sternal precautions, requires increased time for all activities.  Will continue to follow.  At this time dc plan appropriate and will continue to update as needed.   Follow Up Recommendations  Home health OT;Supervision/Assistance - 24 hour    Equipment Recommendations  3 in 1 bedside commode    Recommendations for Other Services PT consult    Precautions / Restrictions Precautions Precautions: Fall;Sternal Precaution Booklet Issued: Yes (comment)(issued in prior OT session) Precaution Comments: pt able to recall keep arms in tube Restrictions Weight Bearing Restrictions: Yes Other Position/Activity Restrictions: Limit RUE mvmt, blockage and bruising        Mobility Bed Mobility Overal bed mobility: Needs Assistance Bed Mobility: Supine to Sit;Sit to Supine;Rolling Rolling: Min guard   Supine to sit: Supervision;HOB elevated   Sit to sidelying: Min guard;HOB elevated General bed mobility comments: long sitting and able to transition LEs to EOB, cueing for sidelying and able to [employer] Overall transfer level: Needs  assistance Equipment used: None Transfers: Sit to/from [facility] Sit to Stand: Min assist Stand pivot transfers: Min assist       General transfer comment: min assist to power up to standing from EOB, requires min guard from [facility]    Balance   Sitting-balance support: No upper extremity supported;Feet supported Sitting balance-Leahy Scale: Good Sitting balance - Comments: able to sit EOB without feet or UE support for 5 minutes w/ supervision. Lightheaded on sitting up, partially resolved w/ time    Standing balance support: No upper extremity supported;During functional activity Standing balance-Leahy Scale: Poor Standing balance comment: Requiring Min A                           ADL either performed or assessed with clinical judgement   ADL Overall ADL's : Needs assistance/impaired     Grooming: Oral care;Bed level;Wash/dry hands Grooming Details (indicate cue type and reason): setup to apply hand sanitizer and complete oral care                  Toilet Transfer: Minimal assistance;Stand-pivot;BSC Toilet Transfer Details (indicate cue type and reason): minA to power up to stand and maintain balance Toileting- Clothing Manipulation and Hygiene: Min guard;Sit to/from stand;Cueing for compensatory techniques Toileting - Clothing Manipulation Details (indicate cue type and reason): min A for standing balance, able to recall precautions and completing hygiene from reaching in front      Functional mobility during ADLs: Minimal assistance(stand pivot only due to fatigue) General ADL Comments: Pt with decreased strength, balance and activity tolerance. Pt with decreased functional use of R UE, good adherance to precautions, became nauseated after  brushing teeth- RN aware and in room.     Vision       Perception     Praxis      Cognition Arousal/Alertness: Awake/alert Behavior During Therapy: Flat affect Overall Cognitive Status:  Impaired/Different from baseline Area of Impairment: Safety/judgement                         Safety/Judgement: Decreased awareness of deficits              Exercises     Shoulder Instructions       General Comments nauseated after brushing teeth, RN aware and present    Pertinent Vitals/ Pain       Pain Assessment: Faces Faces Pain Scale: Hurts little more Pain Location: RUE and chest Pain Descriptors / Indicators: Pressure;Grimacing;Aching Pain Intervention(s): Limited activity within patient's tolerance;Repositioned  Home Living                                          Prior Functioning/Environment              Frequency  Min 2X/week        Progress Toward Goals  OT Goals(current goals can now be found in the care plan section)  Progress towards OT goals: Progressing toward goals  Acute Rehab OT Goals Patient Stated Goal: feel better OT Goal Formulation: With patient Time For Goal Achievement: 03/01/18 Potential to Achieve Goals: Good  Plan Discharge plan remains appropriate;Frequency remains appropriate    Co-evaluation                 AM-PAC PT "6 Clicks" Daily Activity     Outcome Measure   Help from another person eating meals?: None Help from another person taking care of personal grooming?: A Little Help from another person toileting, which includes using toliet, bedpan, or urinal?: A Little Help from another person bathing (including washing, rinsing, drying)?: A Little Help from another person to put on and taking off regular upper body clothing?: A Little Help from another person to put on and taking off regular lower body clothing?: A Little 6 Click Score: 19    End of Session Equipment Utilized During Treatment: Gait belt  OT Visit Diagnosis: Unsteadiness on feet (R26.81);Other abnormalities of gait and mobility (R26.89);Muscle weakness (generalized) (M62.81);Pain Pain - part of body: (chest, R UE  )   Activity Tolerance Patient limited by fatigue;Patient limited by pain   Patient Left in bed;with call bell/phone within reach;with nursing/sitter in room   Nurse Communication Mobility status;Other (comment)(pt needs)        Time: 6222-9798 OT Time Calculation (min): 47 min  Charges: OT General Charges $OT Visit: 1 Visit OT Treatments $Self Care/Home Management : 38-52 mins  Delight Stare, OTR/L  Pager [place] 02/25/2018, 3:36 PM

## 2018-02-26 ENCOUNTER — Encounter (HOSPITAL_COMMUNITY): Payer: Self-pay | Admitting: Cardiology

## 2018-02-26 ENCOUNTER — Encounter (HOSPITAL_COMMUNITY)
Admission: AD | Disposition: A | Discharge status: Skilled Nursing Facility | Payer: Self-pay | Source: Ambulatory Visit | Attending: Thoracic Surgery (Cardiothoracic Vascular Surgery)

## 2018-02-26 DIAGNOSIS — I5022 Chronic systolic (congestive) heart failure: Secondary | ICD-10-CM

## 2018-02-26 DIAGNOSIS — I442 Atrioventricular block, complete: Secondary | ICD-10-CM

## 2018-02-26 DIAGNOSIS — I255 Ischemic cardiomyopathy: Secondary | ICD-10-CM

## 2018-02-26 HISTORY — PX: BIV PACEMAKER INSERTION CRT-P: EP1199

## 2018-02-26 LAB — BASIC METABOLIC PANEL
Anion gap: 13 (ref 5–15)
Anion gap: 12 (ref 5–15)
BUN: 52 mg/dL — ABNORMAL HIGH (ref 8–23)
BUN: 49 mg/dL — ABNORMAL HIGH (ref 8–23)
CO2: 25 mmol/L (ref 22–32)
CO2: 25 mmol/L (ref 22–32)
Calcium: 8.9 mg/dL (ref 8.9–10.3)
Calcium: 8.9 mg/dL (ref 8.9–10.3)
Chloride: 97 mmol/L — ABNORMAL LOW (ref 98–111)
Chloride: 97 mmol/L — ABNORMAL LOW (ref 98–111)
Creatinine, Ser: 4.05 mg/dL — ABNORMAL HIGH (ref 0.44–1.00)
Creatinine, Ser: 3.92 mg/dL — ABNORMAL HIGH (ref 0.44–1.00)
GFR calc Af Amer: 12 mL/min — ABNORMAL LOW (ref >60)
GFR calc Af Amer: 13 mL/min — ABNORMAL LOW (ref >60)
GFR calc non Af Amer: 11 mL/min — ABNORMAL LOW (ref >60)
GFR calc non Af Amer: 11 mL/min — ABNORMAL LOW (ref >60)
Glucose, Bld: 146 mg/dL — ABNORMAL HIGH (ref 70–99)
Glucose, Bld: 160 mg/dL — ABNORMAL HIGH (ref 70–99)
Potassium: 3.1 mmol/L — ABNORMAL LOW (ref 3.5–5.1)
Potassium: 3.3 mmol/L — ABNORMAL LOW (ref 3.5–5.1)
Sodium: 135 mmol/L (ref 135–145)
Sodium: 134 mmol/L — ABNORMAL LOW (ref 135–145)

## 2018-02-26 LAB — PROTIME-INR
INR: 1.16
Prothrombin Time: 14.7 seconds (ref 11.4–15.2)

## 2018-02-26 SURGERY — BIV PACEMAKER INSERTION CRT-P

## 2018-02-26 MED ORDER — CHLORHEXIDINE GLUCONATE 4 % EX LIQD: 60.0000 mL | Freq: Once | CUTANEOUS | Status: DC

## 2018-02-26 MED ORDER — CEFAZOLIN SODIUM-DEXTROSE 2-4 GM/100ML-% IV SOLN
2.0000 g | INTRAVENOUS | Status: AC
  Administered 2018-02-26: 2 g via INTRAVENOUS
  Filled 2018-02-26: qty 100

## 2018-02-26 MED ORDER — MIDAZOLAM HCL 5 MG/5ML IJ SOLN
INTRAMUSCULAR | Status: DC | PRN
  Administered 2018-02-26 (×3): 1 mg via INTRAVENOUS

## 2018-02-26 MED ORDER — ONDANSETRON HCL 4 MG/2ML IJ SOLN
4.0000 mg | Freq: Four times a day (QID) | INTRAMUSCULAR | Status: DC | PRN
  Administered 2018-03-03 – 2018-03-04 (×4): 4 mg via INTRAVENOUS
  Filled 2018-02-26 (×5): qty 2

## 2018-02-26 MED ORDER — LIDOCAINE HCL (PF) 1 % IJ SOLN
INTRAMUSCULAR | Status: AC
  Filled 2018-02-26: qty 30

## 2018-02-26 MED ORDER — FENTANYL CITRATE (PF) 100 MCG/2ML IJ SOLN
INTRAMUSCULAR | Status: DC | PRN
  Administered 2018-02-26 (×3): 25 ug via INTRAVENOUS

## 2018-02-26 MED ORDER — SODIUM CHLORIDE 0.9 % IV SOLN
INTRAVENOUS | Status: AC
  Filled 2018-02-26: qty 2

## 2018-02-26 MED ORDER — SODIUM CHLORIDE 0.9% FLUSH
3.0000 mL | Freq: Two times a day (BID) | INTRAVENOUS | Status: DC
  Administered 2018-02-26 – 2018-02-27 (×3): 3 mL via INTRAVENOUS

## 2018-02-26 MED ORDER — TORSEMIDE 20 MG PO TABS
80.0000 mg | ORAL_TABLET | Freq: Every day | ORAL | Status: DC
  Administered 2018-02-26 – 2018-03-05 (×8): 80 mg via ORAL
  Filled 2018-02-26 (×12): qty 4

## 2018-02-26 MED ORDER — SODIUM CHLORIDE 0.9 % IV SOLN: INTRAVENOUS | Status: DC

## 2018-02-26 MED ORDER — HEPARIN (PORCINE) IN NACL 1000-0.9 UT/500ML-% IV SOLN
INTRAVENOUS | Status: DC | PRN
  Administered 2018-02-26: 500 mL

## 2018-02-26 MED ORDER — LIDOCAINE HCL (PF) 1 % IJ SOLN
INTRAMUSCULAR | Status: DC | PRN
  Administered 2018-02-26: 30 mL
  Administered 2018-02-26: 40 mL

## 2018-02-26 MED ORDER — SODIUM CHLORIDE 0.9 % IV SOLN
80.0000 mg | INTRAVENOUS | Status: AC
  Administered 2018-02-26: 80 mg

## 2018-02-26 MED ORDER — ACETAMINOPHEN 325 MG PO TABS
325.0000 mg | ORAL_TABLET | ORAL | Status: DC | PRN
  Administered 2018-03-04: 650 mg via ORAL
  Filled 2018-02-26: qty 2

## 2018-02-26 MED ORDER — POTASSIUM CHLORIDE CRYS ER 20 MEQ PO TBCR
60.0000 meq | EXTENDED_RELEASE_TABLET | Freq: Once | ORAL | Status: AC
  Administered 2018-02-26: 60 meq via ORAL
  Filled 2018-02-26: qty 3

## 2018-02-26 MED ORDER — CEFAZOLIN SODIUM-DEXTROSE 2-4 GM/100ML-% IV SOLN
INTRAVENOUS | Status: AC
  Filled 2018-02-26: qty 100

## 2018-02-26 MED ORDER — IOPAMIDOL (ISOVUE-370) INJECTION 76%
INTRAVENOUS | Status: AC
  Filled 2018-02-26: qty 50

## 2018-02-26 MED ORDER — MIDAZOLAM HCL 5 MG/5ML IJ SOLN
INTRAMUSCULAR | Status: AC
  Filled 2018-02-26: qty 5

## 2018-02-26 MED ORDER — CEFAZOLIN SODIUM-DEXTROSE 1-4 GM/50ML-% IV SOLN
1.0000 g | Freq: Once | INTRAVENOUS | Status: AC
  Administered 2018-02-27: 1 g via INTRAVENOUS
  Filled 2018-02-26: qty 50

## 2018-02-26 MED ORDER — IOPAMIDOL (ISOVUE-370) INJECTION 76%
INTRAVENOUS | Status: DC | PRN
  Administered 2018-02-26: 12 mL via INTRAVENOUS

## 2018-02-26 MED ORDER — SODIUM CHLORIDE 0.9 % IV SOLN: 250.0000 mL | INTRAVENOUS | Status: DC

## 2018-02-26 MED ORDER — SODIUM CHLORIDE 0.9% FLUSH: 3.0000 mL | INTRAVENOUS | Status: DC | PRN

## 2018-02-26 MED ORDER — FENTANYL CITRATE (PF) 100 MCG/2ML IJ SOLN
INTRAMUSCULAR | Status: AC
  Filled 2018-02-26: qty 2

## 2018-02-26 SURGICAL SUPPLY — 19 items
BALLN COR SINUS VENO 6FR 80 (BALLOONS) ×2
BALLOON COR SINUS VENO 6FR 80 (BALLOONS) ×1 IMPLANT
CABLE SURGICAL S-101-97-12 (CABLE) ×2 IMPLANT
CATH CPS DIRECT 135 DS2C020 (CATHETERS) ×2 IMPLANT
CATH CPS QUART CN DS2N029-65 (CATHETERS) ×2 IMPLANT
CPS IMPLANT KIT 410190 (MISCELLANEOUS) ×2 IMPLANT
LEAD QUARTET 1456Q-86 (Lead) ×1 IMPLANT
LEAD TENDRIL MRI 52CM LPA1200M (Lead) ×2 IMPLANT
LEAD TENDRIL MRI 58CM LPA1200M (Lead) ×2 IMPLANT
PACEMAKER QUDR ALLR CRT PM3562 (Pacemaker) ×1 IMPLANT
PAD PRO RADIOLUCENT 2001M-C (PAD) ×2 IMPLANT
PMKR QUADRA ALLURE CRT PM3562 (Pacemaker) ×2 IMPLANT
QUARTET 1456Q-86 (Lead) ×2 IMPLANT
SHEATH CLASSIC 8F (SHEATH) ×6 IMPLANT
SLITTER UNIVERSAL DS2A003 (MISCELLANEOUS) ×2 IMPLANT
TRAY PACEMAKER INSERTION (PACKS) ×2 IMPLANT
WIRE ACUITY WHISPER EDS 4648 (WIRE) ×4 IMPLANT
WIRE HI TORQ VERSACORE-J 145CM (WIRE) ×2 IMPLANT
WIRE MAILMAN 182CM (WIRE) ×2 IMPLANT

## 2018-02-26 NOTE — Progress Notes (Addendum)
Progress Note  Patient Name: Jocelyn Hill Date of Encounter: 02/26/2018  Primary Cardiologist: Dr. Aundra Dubin  Subjective   Generally not feeling well, CP is reported as chronic, denies SOB, weak, not much appetite, R arm pain is less then before.  Disappointed to hear of need/plans for PPM implant.  Inpatient Medications    Scheduled Meds: . amLODipine  10 mg Oral Daily  . aspirin EC  325 mg Oral Daily  . atorvastatin  80 mg Oral Daily  . buPROPion  100 mg Oral TID AC  . Chlorhexidine Gluconate Cloth  6 each Topical Daily  . Chlorhexidine Gluconate Cloth  6 each Topical Q0600  . doxazosin  8 mg Oral Daily  . enoxaparin (LOVENOX) injection  30 mg Subcutaneous QHS  . feeding supplement  1 Container Oral TID BM  . ferrous [id number] C-folic acid  1 capsule Oral TID PC  . hydrALAZINE  100 mg Oral Q8H  . isosorbide mononitrate  120 mg Oral Daily  . levothyroxine  112 mcg Oral QAC breakfast  . mouth rinse  15 mL Mouth Rinse BID  . montelukast  10 mg Oral Daily  . pantoprazole  40 mg Oral Daily  . warfarin  1 mg Oral q1800  . Warfarin - Physician Dosing Inpatient   Does not apply q1800   Continuous Infusions: . potassium chloride Stopped (02/23/18 0838)   PRN Meds: ALPRAZolam, alteplase, calcium carbonate, metoprolol tartrate, morphine injection, ondansetron (ZOFRAN) IV, oxyCODONE, pentafluoroprop-tetrafluoroeth, potassium chloride, sodium chloride flush, sodium chloride flush, traMADol   Vital Signs    Vitals:   02/26/18 0255 02/26/18 0500 02/26/18 0625 02/26/18 0752  BP: (!) 143/90  (!) 155/89 (!) 129/91  Pulse: 96  99 100  Resp: 15  14 (!) 22  Temp: 98.3 F (36.8 C)  98.4 F (36.9 C) 98 F (36.7 C)  TempSrc: Oral  Oral Oral  SpO2: 99%  97% 95%  Weight:  58.3 kg    Height:        Intake/Output Summary (Last 24 hours) at 02/26/2018 0825 Last data filed at 02/26/2018 0600 Gross per 24 hour  Intake 370 ml  Output 2150 ml  Net -1780 ml   Filed Weights     02/25/18 0246 02/26/18 0043 02/26/18 0500  Weight: 58.9 kg 58.3 kg 58.3 kg    Telemetry    SR/ V paced, >>> underlying remains CHB, V rates 50's - Personally Reviewed  ECG    No new EKGs - Personally Reviewed  Physical Exam   GEN: No acute distress.   Neck: No JVD, L IJ central line Cardiac: RRR, no murmurs, rubs, or gallops.  Respiratory: CTA b/l GI: Soft, nontender, non-distended  MS: No edema; No deformity.  I again do not se any obvious deformity/swelling to RUE Neuro:  Nonfocal  Psych: Somewhat flat affect this morning  Labs    Chemistry Recent Labs  Lab 02/24/18 0437 02/25/18 0500 02/26/18 0300  NA 137 136 135  K 3.4* 3.1* 3.1*  CL 100 99 97*  CO2 25 25 25   GLUCOSE 114* 149* 146*  BUN 61* 60* 52*  CREATININE 4.20* 4.28* 4.05*  CALCIUM 8.4* 8.8* 8.9  PROT  --  5.5*  --   ALBUMIN  --  2.6*  --   AST  --  35  --   ALT  --  31  --   ALKPHOS  --  79  --   BILITOT  --  0.5  --  GFRNONAA 10* 10* 11*  GFRAA 12* 11* 12*  ANIONGAP 12 12 13      Hematology Recent Labs  Lab 02/21/18 0355 02/22/18 0426 02/24/18 0437  WBC 17.8* 12.5* 8.9  RBC 3.30* 3.16* 3.21*  HGB 9.3* 8.8* 8.9*  HCT 26.9* 25.7* 26.1*  MCV 81.5 81.3 81.3  MCH 28.2 27.8 27.7  MCHC 34.6 34.2 34.1  RDW 15.1 14.8 14.6  PLT 89* 120* 208    Cardiac EnzymesNo results for input(s): TROPONINI in the last 168 hours. No results for input(s): TROPIPOC in the last 168 hours.   BNP No results for input(s): BNP, PROBNP in the last 168 hours.   DDimer No results for input(s): DDIMER in the last 168 hours.   Radiology    Dg Chest Port 1 View Result Date: 02/22/2018 CLINICAL DATA:  Follow-up pneumothorax EXAM: PORTABLE CHEST 1 VIEW COMPARISON:  02/21/2018 FINDINGS: Cardiac shadow is enlarged but stable. Postsurgical changes are again seen. Bilateral jugular catheters are again noted and stable. The right lung is well aerated with minimal basilar atelectasis. Left retrocardiac atelectasis is  again noted. Left-sided apical pneumothorax is noted slightly improved when compared with the prior exam. No bony abnormality is noted. IMPRESSION: Mild right basilar atelectasis new from the prior exam. Stable left retrocardiac atelectasis. Some improvement in left pneumothorax is noted. Electronically Signed   By: Jocelyn Hill M.D.   On: 02/22/2018 07:32    Cardiac Studies   02/22/18: TTE Study Conclusions - Left ventricle: The cavity size was normal. Wall thickness was   increased in a pattern of mild LVH. Global hypokinesis with   incoordinate septal motion. Systolic function was mildly reduced.   The estimated ejection fraction was in the range of 45% to 50%.   The study is not technically sufficient to allow evaluation of LV   diastolic function. - Mitral valve: Bioprosthetic MVR. No obstruction. Mobile   subvalvular density - may be ruptured cord or less likely   thrombus. Mean gradient (D): 3 mm Hg. Valve area by continuity   equation (using LVOT flow): 1.87 cm^2. - Left atrium: Severely dilated. - Right ventricle: The cavity size was mildly dilated. Mildly   reduced systolic function. - Right atrium: Moderately dilated. - Atrial septum: No defect or patent foramen ovale was identified.   s/p PFO closure. - Tricuspid valve: There was moderate regurgitation. - Pulmonary arteries: PA peak pressure: 32 mm Hg (S). - Pericardium, extracardiac: A trivial pericardial effusion was   identified posterior to the heart. Features were not consistent   with tamponade physiology. There was a left pleural effusion. Impressions: - Compared to a prior study in 01/2018, the LVEF is lower at 45-50%   with global hypokinesis and incoordinate (post-operative) septal   motion. There has been interval placment of a bioprosthetic MV   with normal gradients. There is mobile material below the valve   which may be flail cord. PFO closure is noted without residual   color doppler flow. A trivial  persistent posterior pericardial   effusion is noted.    Cath 07/03/2017 1. Low filling pressure and preserved cardiac output.  2. 90% ostial LCx stenosis.  3. Napkin ring-like stenosis in the proximal LAD, at least 60% stenosis.  ECHO 02/10/2018 - Left ventricle: The cavity size was mildly dilated. There was moderate concentric hypertrophy. Systolic function was normal. The estimated ejection fraction was in the range of 55% to 60%. Wall motion was normal; there were no regional wall motion abnormalities. Features  are consistent with a pseudonormal left ventricular filling pattern, with concomitant abnormal relaxation and increased filling pressure (grade 2 diastolic dysfunction). Doppler parameters are consistent with high ventricular filling pressure. - Mitral valve: Mild thickening and calcification of the anterior leaflet, consistent with rheumatic disease. Mobility was restricted. Diastolic leaflet doming was present. The findings are consistent with trivial stenosis with mean MV gradient of 70mmHg. There was moderate regurgitation. Effective regurgitant orifice (PISA): 0.17 cm^2. Regurgitant volume (PISA): 24 ml. - Left atrium: The atrium was moderately dilated. - Tricuspid valve: There was mild regurgitation. - Pulmonary arteries: PA peak pressure: 39 mm Hg (S). - Pericardium, extracardiac: A trivial pericardial effusion was identified posterior to the heart. Impressions: - Mildly dilated LV with moderate LVF and normal LVF with EF 55-60%. There is moderate LAE, mild TR and mild pulmonary HTN with PASP 34mmHg. The MV appears rheumatic with thickening and diastolic doming. There appears to be at least moderate MR with ERO of 0.17cm2 and regurgitant volume of 24cc. The right ventricular systolic pressure was increased consistent with mild pulmonary hypertension.   TEE intraop 02/18/2018  Right ventricle: Right ventricular  cavity was normal in size. There was normal RV systolic function.  Tricuspid valve: The tricuspid valve appeared structurally normal. There was trace tricuspid insufficiency.  Pulmonic valve: The pulmonic valve appeared structurally normal. There was trace pulmonic insufficiency.  Mitral valve: The mitral leaflets were thickened but appeared to open without restriction. There was severe mitral regurgitation with a central jet. The ERO by the PISA method was 0.5 cm. The regurgitant volume was 70 cc. There were no flail or prolapsing leaflet segments. On the post bypass exam, there was a bioprosthetic valve in the mitral position. The leaflets opened normally. There was a central jet of mitral insufficiency that was mild. After careful interrogation of the sewing  Right atrium: Cavity is mildly dilated.  Patient Profile     67 y.o. 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, PAF, CKD (IV-V) admitted 02/09/18 with escalating anginal symptoms >>>  02/18/18:  CABG,Left Internal Mammary Artery to Distal Left Anterior Descending Coronary Artery Saphenous Vein Graft to DistalLeft Circumflex Coronary Artery  MVR [facility] Mitral Bovine Bioprosthetic Tissue Valve (size75mm, model #7300TFX, serial #[id number]) MAZE PFO closure Placement of Right Internal Jugular Tridialysis temporary hemodialysis catheter ---- Extubated 02/19/18, small L ptx HD initiated 02/22/18, nephrology optimistic this may not be a chronic need at this time  EP called to case 02/22/18 for persistent heart block post-op  Assessment & Plan      1. CHB persists  She is POD#8 CABG, MVR, MAZE, PFO closure New LBBB post-op  I do not see any nodal blocking/rate limiting agents this hospitalization her R IJ w/temp HD catheter has been removed already L central line remains, d/w RN, Jocelyn Hill remove this morning, pt has peripheral access   Dr. Rayann Hill d/w Dr. Roxy Hill feels at this point, not likely to see any  recovery of her conduction and would like plans for PPM implant, today preferably.   * pt with known L apical pneumothorax post-op,  yesterday's cxr read with no ptx * RUE pain is less, no DVT, + superficial cephalic vein thrombosis * CKD (IV), temp HD cath removed, had on session of HD post-op    BUN/Creat today 52/4.05    Nephrology on board * post-op LVEF by TTE is 40-45%, new LBBB post op    ? Consideration for CRT-P given CHB * hypokalemic   Replacement  deferred to nephrologist/CTS services * warfarin (hx of AFib) started, INR today 1.16  I discussed with the patient after MD's discussed he case this morning, felt we should proceed with PPM.  She is disappointed but understands. Discussed the procedure, potential risks and benefits.  She is willing to proceed Dr. Curt Hill Jocelyn Hill see her later this morning    For questions or updates, please contact [place] Please consult [email or web address] for contact info under Cardiology/STEMI.      Signed, Jocelyn [place], PA-C  02/26/2018, 8:25 AM     I have seen and examined this patient with Jocelyn Hill.  Agree with above, note added to reflect my findings.  On exam, RRR, no murmurs, lungs clear. Patient with continued complete AV block. Has low EF and Jocelyn Hill thus plan for CRT-P.    Jocelyn Hill has presented today for surgery, with the diagnosis of complete heart block.  The various methods of treatment have been discussed with the patient and family. After consideration of risks, benefits and other options for treatment, the patient has consented to  Procedure(s): CRT-P implant as a surgical intervention .  Risks include but not limited to bleeding, tamponade, infection, pneumothorax, among others. The patient's history has been reviewed, patient examined, no change in status, stable for surgery.  I have reviewed the patient's chart and labs.  Questions were answered to the patient's satisfaction.    Jocelyn Hill Jocelyn Bears, MD 02/26/2018 10:16  AM    Jocelyn Bogen M. Giovoni Bunch MD 02/26/2018 10:16 AM

## 2018-02-26 NOTE — Progress Notes (Signed)
CARDIAC REHAB PHASE I   PRE:  Rate/Rhythm: pacing 97  BP:  Supine:   Sitting: 146/89  Standing:    SaO2: 98%RA  MODE:  Ambulation: to door and back to bed  ft   POST:  Rate/Rhythm: pacing 103  BP:  Supine:   Sitting: 134/86  Standing:    SaO2: 99%RA [id number] Pt ready to go back to bed so encouraged her to walk as tolerated. Pt agreed to walk to door and back to bed. C/o incisional chest discomfort. Used rolling walker, on RA and asst x 2. Assisted to bed after walk.   Graylon Good, RN BSN  02/26/2018 9:27 AM

## 2018-02-26 NOTE — Progress Notes (Signed)
PHARMACY NOTE:  ANTIMICROBIAL RENAL DOSAGE ADJUSTMENT  Current antimicrobial regimen includes a mismatch between antimicrobial dosage and estimated renal function.  As per policy approved by the Pharmacy & Therapeutics and Medical Executive Committees, the antimicrobial dosage will be adjusted accordingly.  Current antimicrobial dosage:  Ancef 1g IV q6h x3  Indication: Surgical prophylaxis  Renal Function:  Estimated Creatinine Clearance: 12.6 mL/min (A) (by C-G formula based on SCr of 4.05 mg/dL (H)). []      On intermittent HD, scheduled: []      On CRRT    Antimicrobial dosage has been changed to:  Ancef 1g x1 tomorrow morning  Additional comments: Given renal dysfunction, a one-time dose tomorrow morning will provide necessary prophylactic coverage.   Thank you for allowing pharmacy to be a part of this patient's care.  Arrie Senate, PharmD, BCPS Clinical Pharmacist [phone] Please check AMION for all [place] numbers 02/26/2018

## 2018-02-26 NOTE — Progress Notes (Addendum)
Patient ID: Jocelyn Hill, female   DOB: [birth year], 67 y.o.   MRN: [id number]   Progress Note  Patient Name: Jocelyn Hill Date of Encounter: 02/26/2018  Primary Cardiologist: No primary care provider on file.   Subjective   Underlying rhythm remains complete heart block per Dr Rayann Heman. Plans for PPM implant today.   Creatinine trending back down. Great UOP on IV lasix yesterday. Weight unchanged.  Coumadin restarted 8/28. INR 1.16  Having incisional pain this morning. Says pain meds only help for a short time. Breathing is more shallow secondary to pain. Upset that she needs to get PPM and that she is NPO.  - Echo (8/26): EF 45-50%, global HK, bioprosthetic MV functioning normally, PFO closed.   02/18/2018  Mitral Valve Replacement Edwards Magna Mitral Bovine Bioprosthetic Tissue Valve (size63mm, model #7300TFX, serial #[id number])   Maze Procedure complete bilateral atrial lesion set using bipolar radiofrequency and cryothermy ablation clipping of left atrial appendage (Atricure Pro245 left atrial clipsize 20mm)   Coronary Artery Bypass Grafting x2 Left Internal Mammary Artery to Distal Left Anterior Descending Coronary Artery Saphenous Vein Graft to DistalLeft Circumflex Coronary Artery Endoscopic Vein Harvest from RightThigh    Closure of Patent Foramen Ovale  Inpatient Medications    Scheduled Meds: . amLODipine  10 mg Oral Daily  . aspirin EC  325 mg Oral Daily  . atorvastatin  80 mg Oral Daily  . buPROPion  100 mg Oral TID AC  . Chlorhexidine Gluconate Cloth  6 each Topical Daily  . Chlorhexidine Gluconate Cloth  6 each Topical Q0600  . doxazosin  8 mg Oral Daily  . enoxaparin (LOVENOX) injection  30 mg Subcutaneous QHS  . feeding supplement  1 Container Oral TID BM  . ferrous [id number] C-folic acid  1 capsule Oral TID PC  . hydrALAZINE  100 mg Oral Q8H  .  isosorbide mononitrate  120 mg Oral Daily  . levothyroxine  112 mcg Oral QAC breakfast  . mouth rinse  15 mL Mouth Rinse BID  . montelukast  10 mg Oral Daily  . pantoprazole  40 mg Oral Daily  . warfarin  1 mg Oral q1800  . Warfarin - Physician Dosing Inpatient   Does not apply q1800   Continuous Infusions: . potassium chloride Stopped (02/23/18 0838)   PRN Meds: ALPRAZolam, alteplase, calcium carbonate, metoprolol tartrate, morphine injection, ondansetron (ZOFRAN) IV, oxyCODONE, pentafluoroprop-tetrafluoroeth, potassium chloride, sodium chloride flush, sodium chloride flush, traMADol   Vital Signs    Vitals:   02/26/18 0625 02/26/18 0752 02/26/18 0828 02/26/18 0833  BP: (!) 155/89 (!) 129/91 (!) 143/85 (!) 143/85  Pulse: 99 100    Resp: 14 (!) 22    Temp: 98.4 F (36.9 C) 98 F (36.7 C)    TempSrc: Oral Oral    SpO2: 97% 95%    Weight:      Height:        Intake/Output Summary (Last 24 hours) at 02/26/2018 0852 Last data filed at 02/26/2018 0600 Gross per 24 hour  Intake 370 ml  Output 2150 ml  Net -1780 ml   Filed Weights   02/25/18 0246 02/26/18 0043 02/26/18 0500  Weight: 58.9 kg 58.3 kg 58.3 kg    Telemetry    VPaced 90s with underlying NSR Personally reviewed.   ECG   No new tracings.   Physical Exam   General: Thin. No resp difficulty. HEENT: Normal Neck: Supple. JVP 7-8. Carotids 2+ bilat; no bruits. No thyromegaly or nodule noted.  Left IJ TLC in place.  Cor: PMI nondisplaced. RRR, split S2 Lungs: CTAB, normal effort. Abdomen: Soft, non-tender, non-distended, no HSM. No bruits or masses. +BS  Extremities: No cyanosis, clubbing, or rash. R and LLE no edema.  Neuro: Alert & orientedx3, cranial nerves grossly intact. moves all 4 extremities w/o difficulty. Affect pleasant   Labs    Chemistry Recent Labs  Lab 02/24/18 0437 02/25/18 0500 02/26/18 0300  NA 137 136 135  K 3.4* 3.1* 3.1*  CL 100 99 97*  CO2 25 25 25   GLUCOSE 114* 149* 146*    BUN 61* 60* 52*  CREATININE 4.20* 4.28* 4.05*  CALCIUM 8.4* 8.8* 8.9  PROT  --  5.5*  --   ALBUMIN  --  2.6*  --   AST  --  35  --   ALT  --  31  --   ALKPHOS  --  79  --   BILITOT  --  0.5  --   GFRNONAA 10* 10* 11*  GFRAA 12* 11* 12*  ANIONGAP 12 12 13      Hematology Recent Labs  Lab 02/21/18 0355 02/22/18 0426 02/24/18 0437  WBC 17.8* 12.5* 8.9  RBC 3.30* 3.16* 3.21*  HGB 9.3* 8.8* 8.9*  HCT 26.9* 25.7* 26.1*  MCV 81.5 81.3 81.3  MCH 28.2 27.8 27.7  MCHC 34.6 34.2 34.1  RDW 15.1 14.8 14.6  PLT 89* 120* 208    Cardiac Enzymes No results for input(s): TROPONINI in the last 168 hours. No results for input(s): TROPIPOC in the last 168 hours.   BNP No results for input(s): BNP, PROBNP in the last 168 hours.   DDimer No results for input(s): DDIMER in the last 168 hours.   Radiology    Dg Chest 2 View  Result Date: 02/25/2018 CLINICAL DATA:  67 year old female with a history of cardiac surgery 02/18/2018 EXAM: CHEST - 2 VIEW COMPARISON:  02/22/2018, 02/21/2018, 02/20/2018, 02/18/2018 FINDINGS: Cardiomediastinal silhouette unchanged in size and contour. Postoperative changes again demonstrate of median sternotomy, CABG, mitral valve repair, and left atrial amputation. Interval removal of right IJ sheath. Left IJ central catheter unchanged. Epicardial pacing leads remain in place. Opacity at the left base with obscuration of left hemidiaphragm and blunting of left costophrenic angle. No evidence of interlobular septal thickening. No pneumothorax. IMPRESSION: Small left pleural effusion with associated atelectasis. Interval removal of right IJ sheath, with unchanged left IJ catheter. Epicardial pacing leads remain. Surgical changes of median sternotomy, CABG, mitral valve repair. Electronically Signed   By: Corrie Mckusick D.O.   On: 02/25/2018 11:44    Cardiac Studies   Cath 07/03/2017 1. Low filling pressure and preserved cardiac output.  2. 90% ostial LCx stenosis.  3.  Napkin ring-like stenosis in the proximal LAD, at least 60% stenosis.   ECHO 02/10/2018 - Left ventricle: The cavity size was mildly dilated. There was moderate concentric hypertrophy. Systolic function was normal. The estimated ejection fraction was in the range of 55% to 60%. Wall motion was normal; there were no regional wall motion abnormalities. Features are consistent with a pseudonormal left ventricular filling pattern, with concomitant abnormal relaxation and increased filling pressure (grade 2 diastolic dysfunction). Doppler parameters are consistent with high ventricular filling pressure. - Mitral valve: Mild thickening and calcification of the anterior leaflet, consistent with rheumatic disease. Mobility was restricted. Diastolic leaflet doming was present. The findings are consistent with trivial stenosis with mean MV gradient of 96mmHg. There was moderate regurgitation. Effective regurgitant orifice (PISA):  0.17 cm^2. Regurgitant volume (PISA): 24 ml. - Left atrium: The atrium was moderately dilated. - Tricuspid valve: There was mild regurgitation. - Pulmonary arteries: PA peak pressure: 39 mm Hg (S). - Pericardium, extracardiac: A trivial pericardial effusion was identified posterior to the heart.  Impressions:  - Mildly dilated LV with moderate LVF and normal LVF with EF 55-60%. There is moderate LAE, mild TR and mild pulmonary HTN with PASP 28mmHg. The MV appears rheumatic with thickening and diastolic doming. There appears to be at least moderate MR with ERO of 0.17cm2 and regurgitant volume of 24cc. The right ventricular systolic pressure was increased consistent with mild pulmonary hypertension.   TEE intraop 02/18/2018  Right ventricle: Right ventricular cavity was normal in size. There was normal RV systolic function.  Tricuspid valve: The tricuspid valve appeared structurally normal. There was trace tricuspid  insufficiency.  Pulmonic valve: The pulmonic valve appeared structurally normal. There was trace pulmonic insufficiency.  Mitral valve: The mitral leaflets were thickened but appeared to open without restriction. There was severe mitral regurgitation with a central jet. The ERO by the PISA method was 0.5 cm. The regurgitant volume was 70 cc. There were no flail or prolapsing leaflet segments. On the post bypass exam, there was a bioprosthetic valve in the mitral position. The leaflets opened normally. There was a central jet of mitral insufficiency that was mild. After careful interrogation of the sewing  Right atrium: Cavity is mildly dilated.  Patient Profile     67 y.o. female with severe 2 vessel CAD, severe MR, preserved LVEF, paroxysmal atrial fibrillation, CKD stage 4, severe HTN now s/p CABG/MVR/MAZE on 08/22, had postop complete heart block, subsequently second-degree AV block Mobitz type I  Assessment & Plan    1. CAD s/p CABG: has surgical pain.  LBBB is pre-existing. - Continue ASA and statin. No s/s ischemia.  2. 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 No change.  3. Heart block: She currently is in NSR with V-pacing from pacing wires.  When pacing turned down, she remains in heart block with PVCs => this appears to be high grade HB.  - Avoid nodal blockers.  - EP following. May end up needing a PPM, with low EF at 45-50% may benefit from BiV pacing.  Central lines now out => Going for PPM today.  4. AFib s/p MAZE: no afib seen on monitor since surgery - Coumadin restarted. INR 1.16 5. HTN: SBP 120-150s  - Continue doxazosin 8 mg daily.  - Continue hydralazine 100 mg TID, imdur 120 mg daily, and amlodipine 10 mg daily.  6. Acute on chronic stage 4 renal insufficiency:  Had HD 8/26 but UOP has since improved. Making over 2 liters of urine. Creatinine trending down 4.05 - Temp HD cath removed 7. L apical pneumothorax: For now  monitoring w/o chest tube. Resolved on CXR yesterday.  8. Acute on chronic primarily diastolic CHF: Still up about 3 lbs from pre-op.  Echo 8/26 with EF 45-50%.  - Volume stable on exam.  - Start torsemide 80 mg daily.  - No HD for now with good UOP.  9. Anemia: Post-op and renal disease.  Hgb stable 8.9 10. Right arm pain: Had PICC in right arm, [place] No DVT but with acute superficial vein thrombosis in the cephalic vein. Having numbness/tingling in thumb and first 2 fingers on right hand.  11. Hypokalemia - K 3.1. [place], NP  02/26/2018,  8:52 AM     Advanced Heart Failure Team Pager [phone] (M-F; 7a - 4p)  Please contact [facility] for night-coverage after hours (4p -7a ) and weekends on [email or web address]  Patient seen with NP, agree with the above note.   Volume status looks good today.  Transition to torsemide 80 mg daily and replace K. BUN/creatinine slightly improved.   She has stayed in high grade heart block.  Had [employer] Jude CRT-P device placed today.   PAF, now on NSR.  Warfarin started.   Continue to mobilize with PT.    [name] 02/26/2018 1:39 PM

## 2018-02-26 NOTE — Progress Notes (Addendum)
Patient ID: Jocelyn Hill, female   DOB: [birth year], 67 y.o.   MRN: [id number] [facility] Progress Note   Assessment/ Plan:   1. 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  She does not have any acute indications for hemodialysis and has been converted over to oral diuretics today. 2.  Hyponatremia: Sodium levels corrected with hemodialysis/diuretic therapy. 3.  Complete heart block: Ongoing planning for pacemaker placement. 4.  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  Plans noted for pacemaker placement. 5.  Anemia: Hemoglobin/hematocrit remained stable-continue ESA.  Iron stores previously checked found to be replete. 6.  Hypokalemia: Secondary to diuretic induced losses, ongoing replacement via oral route.  Subjective:   Reports to be feeling better this morning-ambulated around hallways with assistance yesterday.   Objective:   BP (!) 143/85   Pulse 100   Temp 98 F (36.7 C) (Oral)   Resp (!) 22   Ht 5\' 9"  (1.753 m)   Wt 58.3 kg   SpO2 95%   BMI 18.98 kg/m   Intake/Output Summary (Last 24 hours) at 02/26/2018 1023 Last data filed at 02/26/2018 0900 Gross per 24 hour  Intake 370 ml  Output 2150 ml  Net -1780 ml   Weight change: -0.568 kg  Physical Exam: Gen: Comfortably resting in bed, daughter at her side. CVS: Pulse regular rhythm, normal rate, S1 and S2 without any murmurs or rubs Resp: Poor inspiratory effort, decreased breath sounds over bases, no rales/rhonchi Abd: Soft, flat, nontender Ext: No pretibial edema appreciated.  Improving right upper arm swelling noted.  Imaging: Dg Chest 2 View  Result Date: 02/25/2018 CLINICAL DATA:  67 year old female with a history  of cardiac surgery 02/18/2018 EXAM: CHEST - 2 VIEW COMPARISON:  02/22/2018, 02/21/2018, 02/20/2018, 02/18/2018 FINDINGS: Cardiomediastinal silhouette unchanged in size and contour. Postoperative changes again demonstrate of median sternotomy, CABG, mitral valve repair, and left atrial amputation. Interval removal of right IJ sheath. Left IJ central catheter unchanged. Epicardial pacing leads remain in place. Opacity at the left base with obscuration of left hemidiaphragm and blunting of left costophrenic angle. No evidence of interlobular septal thickening. No pneumothorax. IMPRESSION: Small left pleural effusion with associated atelectasis. Interval removal of right IJ sheath, with unchanged left IJ catheter. Epicardial pacing leads remain. Surgical changes of median sternotomy, CABG, mitral valve repair. Electronically Signed   By: Corrie Mckusick D.O.   On: 02/25/2018 11:44    Labs: BMET Recent Labs  Lab 02/20/18 0339 02/21/18 0355 02/22/18 0426 02/23/18 0446 02/24/18 0437 02/25/18 0500 02/26/18 0300  NA 137 134* 129* 132* 137 136 135  K 4.2 3.9 3.4* 3.5 3.4* 3.1* 3.1*  CL 102 98 93* 96* 100 99 97*  CO2 21* 20* 20* 24 25 25 25   GLUCOSE 187* 140* 122* 244* 114* 149* 146*  BUN 67* 84* 91* 57* 61* 60* 52*  CREATININE 5.09* 5.31* 5.53* 4.01* 4.20* 4.28* 4.05*  CALCIUM 9.1 8.4* 8.0* 8.1* 8.4* 8.8* 8.9   CBC Recent Labs  Lab 02/20/18 0339 02/21/18 0355 02/22/18 0426 02/24/18 0437  WBC 18.8* 17.8* 12.5* 8.9  HGB 10.2* 9.3* 8.8* 8.9*  HCT 30.9* 26.9* 25.7* 26.1*  MCV 83.1 81.5 81.3 81.3  PLT 79* 89* 120* 208    Medications:    .  amLODipine  10 mg Oral Daily  . aspirin EC  325 mg Oral Daily  . atorvastatin  80 mg Oral Daily  . buPROPion  100 mg Oral TID AC  . Chlorhexidine Gluconate Cloth  6 each Topical Daily  . Chlorhexidine Gluconate Cloth  6 each Topical Q0600  . doxazosin  8 mg Oral Daily  . enoxaparin (LOVENOX) injection  30 mg Subcutaneous QHS  . feeding supplement  1  Container Oral TID BM  . ferrous [id number] C-folic acid  1 capsule Oral TID PC  . hydrALAZINE  100 mg Oral Q8H  . isosorbide mononitrate  120 mg Oral Daily  . levothyroxine  112 mcg Oral QAC breakfast  . mouth rinse  15 mL Mouth Rinse BID  . montelukast  10 mg Oral Daily  . pantoprazole  40 mg Oral Daily  . potassium chloride  60 mEq Oral Once  . torsemide  80 mg Oral Daily  . warfarin  1 mg Oral q1800  . Warfarin - Physician Dosing Inpatient   Does not apply [id number]   Elmarie Shiley, MD 02/26/2018, 10:23 AM

## 2018-02-26 NOTE — Progress Notes (Signed)
Progress Note   Subjective   + chest pain, Dr Roxy Manns reports this to be chronic,  Inpatient Medications    Scheduled Meds: . amLODipine  10 mg Oral Daily  . aspirin EC  325 mg Oral Daily  . atorvastatin  80 mg Oral Daily  . buPROPion  100 mg Oral TID AC  . Chlorhexidine Gluconate Cloth  6 each Topical Daily  . Chlorhexidine Gluconate Cloth  6 each Topical Q0600  . doxazosin  8 mg Oral Daily  . enoxaparin (LOVENOX) injection  30 mg Subcutaneous QHS  . feeding supplement  1 Container Oral TID BM  . ferrous [id number] C-folic acid  1 capsule Oral TID PC  . hydrALAZINE  100 mg Oral Q8H  . isosorbide mononitrate  120 mg Oral Daily  . levothyroxine  112 mcg Oral QAC breakfast  . mouth rinse  15 mL Mouth Rinse BID  . montelukast  10 mg Oral Daily  . pantoprazole  40 mg Oral Daily  . warfarin  1 mg Oral q1800  . Warfarin - Physician Dosing Inpatient   Does not apply q1800   Continuous Infusions: . potassium chloride Stopped (02/23/18 0838)   PRN Meds: ALPRAZolam, alteplase, calcium carbonate, metoprolol tartrate, morphine injection, ondansetron (ZOFRAN) IV, oxyCODONE, pentafluoroprop-tetrafluoroeth, potassium chloride, sodium chloride flush, sodium chloride flush, traMADol   Vital Signs    Vitals:   02/26/18 0255 02/26/18 0500 02/26/18 0625 02/26/18 0752  BP: (!) 143/90  (!) 155/89 (!) 129/91  Pulse: 96  99 100  Resp: 15  14 (!) 22  Temp: 98.3 F (36.8 C)  98.4 F (36.9 C) 98 F (36.7 C)  TempSrc: Oral  Oral Oral  SpO2: 99%  97% 95%  Weight:  58.3 kg    Height:        Intake/Output Summary (Last 24 hours) at 02/26/2018 0802 Last data filed at 02/26/2018 0600 Gross per 24 hour  Intake 370 ml  Output 2150 ml  Net -1780 ml   Filed Weights   02/25/18 0246 02/26/18 0043 02/26/18 0500  Weight: 58.9 kg 58.3 kg 58.3 kg    Telemetry    Sinus with V pacing, underlying rhythm is complete heart block - Personally Reviewed  Physical Exam   GEN- The patient  is ill appearing, alert and oriented x 3 today.   Head- normocephalic, atraumatic Eyes-  Sclera clear, conjunctiva pink Ears- hearing intact Oropharynx- clear Neck- supple, Lungs- Clear to ausculation bilaterally, normal work of breathing Heart- Regular rate and rhythm  (paced) GI- soft, NT, ND, + BS Extremities- no clubbing, cyanosis, or edema  MS- no significant deformity or atrophy Skin- no rash or lesion Psych- euthymic mood, full affect Neuro- strength and sensation are intact   Labs    Chemistry Recent Labs  Lab 02/24/18 0437 02/25/18 0500 02/26/18 0300  NA 137 136 135  K 3.4* 3.1* 3.1*  CL 100 99 97*  CO2 25 25 25   GLUCOSE 114* 149* 146*  BUN 61* 60* 52*  CREATININE 4.20* 4.28* 4.05*  CALCIUM 8.4* 8.8* 8.9  PROT  --  5.5*  --   ALBUMIN  --  2.6*  --   AST  --  35  --   ALT  --  31  --   ALKPHOS  --  79  --   BILITOT  --  0.5  --   GFRNONAA 10* 10* 11*  GFRAA 12* 11* 12*  ANIONGAP 12 12 13      Hematology Recent  Labs  Lab 02/21/18 0355 02/22/18 0426 02/24/18 0437  WBC 17.8* 12.5* 8.9  RBC 3.30* 3.16* 3.21*  HGB 9.3* 8.8* 8.9*  HCT 26.9* 25.7* 26.1*  MCV 81.5 81.3 81.3  MCH 28.2 27.8 27.7  MCHC 34.6 34.2 34.1  RDW 15.1 14.8 14.6  PLT 89* 120* 208    Cardiac EnzymesNo results for input(s): TROPONINI in the last 168 hours. No results for input(s): TROPIPOC in the last 168 hours.      Assessment & Plan    1.  Complete heart block Remains in complete heart block for more than a week post MVR.  I have spoken with Dr Roxy Manns who advises that we proceed with PPM implant at this time.  Given EF < 50%, would consider CRT-P.  I will discuss with Dr Curt Bears who is in the hospital today. Will need to remove L neck CVL at this time.  Hold heparin.  Make NPO.  Dr Curt Bears to follow-up later this am.  Thompson Grayer MD, [facility] 02/26/2018 8:02 AM

## 2018-02-26 NOTE — Progress Notes (Addendum)
[address]       [place] [address]             [phone]      8 Days Post-Op Procedure(s) (LRB): MITRAL VALVE (MV) REPLACEMENT (N/A) CORONARY ARTERY BYPASS GRAFTING (CABG) WITH IMA. ENDOSCOPIC VEIN HARVEST. IMA TO LAD, SVG TO CIRC. (N/A) MAZE (N/A) TRANSESOPHAGEAL ECHOCARDIOGRAM (TEE) (N/A) PLACEMENT OF TEMPORARY HD CATHETER, POWER TRIALYSIS 13FR 20CM (N/A) PATENT FORAMEN OVALE (PFO) CLOSURE (N/A) Subjective: Feeling discouraged this morning. She is upset she has to get a PPM. She is having some incisional pain. She states the pain medicine works for a short amount of time but then wears off.   Objective: Vital signs in last 24 hours: Temp:  [98 F (36.7 C)-99.1 F (37.3 C)] 98 F (36.7 C) (08/30 0752) Pulse Rate:  [94-100] 100 (08/30 0752) Cardiac Rhythm: Ventricular paced (08/30 0255) Resp:  [14-22] 22 (08/30 0752) BP: (127-155)/(73-91) 129/91 (08/30 0752) SpO2:  [95 %-99 %] 95 % (08/30 0752) Weight:  [58.3 kg] 58.3 kg (08/30 0500)  Hemodynamic parameters for last 24 hours: CVP:  [5 mmHg-9 mmHg] 5 mmHg  Intake/Output from previous day: 08/29 0701 - 08/30 0700 In: 610 [P.O.:600; I.V.:10] Out: 2550 [Urine:2550] Intake/Output this shift: No intake/output data recorded.  General appearance: alert, cooperative and no distress Heart: paced rhythm Lungs: clear to auscultation bilaterally Abdomen: soft, non-tender; bowel sounds normal; no masses,  no organomegaly Extremities: extremities normal, atraumatic, no cyanosis or edema Wound: clean and dry  Lab Results: Recent Labs    02/24/18 0437  WBC 8.9  HGB 8.9*  HCT 26.1*  PLT 208   BMET:  Recent Labs    02/25/18 0500 02/26/18 0300  NA 136 135  K 3.1* 3.1*  CL 99 97*  CO2 25 25  GLUCOSE 149* 146*  BUN 60* 52*  CREATININE 4.28* 4.05*  CALCIUM 8.8* 8.9    PT/INR:  Recent Labs    02/26/18 0300  LABPROT 14.7  INR 1.16   ABG    Component Value Date/Time   PHART 7.297 (L)  02/19/2018 1053   HCO3 21.3 02/19/2018 1053   TCO2 23 02/19/2018 1702   ACIDBASEDEF 5.0 (H) 02/19/2018 1053   O2SAT 97.0 02/19/2018 1053   CBG (last 3)  No results for input(s): GLUCAP in the last 72 hours.  Assessment/Plan: S/P Procedure(s) (LRB): MITRAL VALVE (MV) REPLACEMENT (N/A) CORONARY ARTERY BYPASS GRAFTING (CABG) WITH IMA. ENDOSCOPIC VEIN HARVEST. IMA TO LAD, SVG TO CIRC. (N/A) MAZE (N/A) TRANSESOPHAGEAL ECHOCARDIOGRAM (TEE) (N/A) PLACEMENT OF TEMPORARY HD CATHETER, POWER TRIALYSIS 13FR 20CM (N/A) PATENT FORAMEN OVALE (PFO) CLOSURE (N/A)   1. CV- Under the pacer she is in CHB. EP following and assisting, PPM later this morning. BP well controlled. Continue Norvasc, ASA, statin, Cardura and low-dose coumadin 2. Pulm-Tolerating room air with good oxygen saturation. Continue Singulair.  3. Renal-On 120mg  Lasix BID. Acute on chronic kidney disease. Creatinine 4.05 this morning. Hypokalemia replacement per renal. 4. Continue low-dose coumadin. INR 1.16 5. H and H has been stable, expected acute blood loss anemia 6.  Diet-currently tolerating clears and would like to be advanced.  7. Continue to work with PT/OT for increased strength and mobility  Plan: PPM today so patient is NPO. Continue diuretics per renal. Continue to ambulate in the halls as tolerated. Encouraged incentive spirometer. Making slow forward progress.    LOS: 17 days    Elgie Collard 02/26/2018  I have seen and examined  the patient and agree with the assessment and plan as outlined.  Still in CHB.  I agree that it's probably wise to proceed w/ permanent pacemaker placement as CHB persists now 8 days postop.  Rexene Alberts, MD 02/26/2018 9:02 AM

## 2018-02-27 ENCOUNTER — Inpatient Hospital Stay (HOSPITAL_COMMUNITY): Payer: Medicare Other

## 2018-02-27 LAB — RENAL FUNCTION PANEL
Albumin: 2.8 g/dL — ABNORMAL LOW (ref 3.5–5.0)
Anion gap: 11 (ref 5–15)
BUN: 46 mg/dL — ABNORMAL HIGH (ref 8–23)
CO2: 23 mmol/L (ref 22–32)
Calcium: 9 mg/dL (ref 8.9–10.3)
Chloride: 101 mmol/L (ref 98–111)
Creatinine, Ser: 4.05 mg/dL — ABNORMAL HIGH (ref 0.44–1.00)
GFR calc Af Amer: 12 mL/min — ABNORMAL LOW (ref >60)
GFR calc non Af Amer: 11 mL/min — ABNORMAL LOW (ref >60)
Glucose, Bld: 197 mg/dL — ABNORMAL HIGH (ref 70–99)
Phosphorus: 4.2 mg/dL (ref 2.5–4.6)
Potassium: 4 mmol/L (ref 3.5–5.1)
Sodium: 135 mmol/L (ref 135–145)

## 2018-02-27 LAB — PROTIME-INR
INR: 1.29
Prothrombin Time: 16 seconds — ABNORMAL HIGH (ref 11.4–15.2)

## 2018-02-27 MED ORDER — ASPIRIN EC 81 MG PO TBEC
81.0000 mg | DELAYED_RELEASE_TABLET | Freq: Every day | ORAL | Status: DC
  Administered 2018-02-28 – 2018-03-05 (×6): 81 mg via ORAL
  Filled 2018-02-27 (×6): qty 1

## 2018-02-27 NOTE — Progress Notes (Signed)
Patient ID: Jocelyn Hill, female   DOB: [birth year], 67 y.o.   MRN: [id number] [facility] Progress Note   Assessment/ Plan:   1. 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  She maintains fair urine output on current diuretic therapy and remains net negative.  Recommend follow-up with Dr.Upton as an outpatient after discharge- will verify she has an appointment set. 2. Complete heart block: Status post biventricular pacemaker placement yesterday. 3.  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   4.  Anemia: Hemoglobin/hematocrit remained stable-continue ESA.  Iron stores previously checked found to be replete. 5.  Hypokalemia: Secondary to diuretic induced losses, corrected with supplementation via oral route.  Will sign off at this time-please call with questions or concerns  Subjective:   Reports to be feeling fair-somewhat shaken up yesterday after ceiling panels from her room fell down and required for her to be transferred to a new room.  Underwent pacemaker placement yesterday.   Objective:   BP 131/83 (BP Location: Left Arm)   Pulse 98   Temp 97.9 F (36.6 C) (Oral)   Resp 18   Ht 5\' 9"  (1.753 m)   Wt 58.3 kg   SpO2 97%   BMI 18.98 kg/m   Intake/Output Summary (Last 24 hours) at 02/27/2018 0943 Last data filed at 02/27/2018 0913 Gross per 24 hour  Intake 1200 ml  Output 1750 ml  Net -550 ml   Weight change: -0.033 kg  Physical Exam: Gen: Comfortably resting in bed, watching television CVS: Pulse regular rhythm, normal rate, S1 and S2 without any murmurs or rubs Resp: Poor inspiratory effort, decreased breath sounds over bases, no rales/rhonchi.  Palpable pacemaker left upper chest Abd:  Soft, flat, nontender Ext: No pedal edema  Imaging: No results found.  Labs: BMET Recent Labs  Lab 02/22/18 0426 02/23/18 0446 02/24/18 0437 02/25/18 0500 02/26/18 0300 02/26/18 1828 02/27/18 0411  NA 129* 132* 137 136 135 134* 135  K 3.4* 3.5 3.4* 3.1* 3.1* 3.3* 4.0  CL 93* 96* 100 99 97* 97* 101  CO2 20* 24 25 25 25 25 23   GLUCOSE 122* 244* 114* 149* 146* 160* 197*  BUN 91* 57* 61* 60* 52* 49* 46*  CREATININE 5.53* 4.01* 4.20* 4.28* 4.05* 3.92* 4.05*  CALCIUM 8.0* 8.1* 8.4* 8.8* 8.9 8.9 9.0  PHOS  --   --   --   --   --   --  4.2   CBC Recent Labs  Lab 02/21/18 0355 02/22/18 0426 02/24/18 0437  WBC 17.8* 12.5* 8.9  HGB 9.3* 8.8* 8.9*  HCT 26.9* 25.7* 26.1*  MCV 81.5 81.3 81.3  PLT 89* 120* 208    Medications:    . amLODipine  10 mg Oral Daily  . aspirin EC  325 mg Oral Daily  . atorvastatin  80 mg Oral Daily  . buPROPion  100 mg Oral TID AC  . chlorhexidine  60 mL Topical Once  . chlorhexidine  60 mL Topical Once  . Chlorhexidine Gluconate Cloth  6 each Topical Daily  . Chlorhexidine Gluconate Cloth  6 each Topical Q0600  . doxazosin  8 mg Oral Daily  . enoxaparin (LOVENOX) injection  30 mg Subcutaneous QHS  . feeding supplement  1 Container Oral TID BM  .  ferrous [id number] C-folic acid  1 capsule Oral TID PC  . hydrALAZINE  100 mg Oral Q8H  . isosorbide mononitrate  120 mg Oral Daily  . levothyroxine  112 mcg Oral QAC breakfast  . mouth rinse  15 mL Mouth Rinse BID  . montelukast  10 mg Oral Daily  . pantoprazole  40 mg Oral Daily  . sodium chloride flush  3 mL Intravenous Q12H  . torsemide  80 mg Oral Daily  . warfarin  1 mg Oral q1800  . Warfarin - Physician Dosing Inpatient   Does not apply M2111   Elmarie Shiley, MD 02/27/2018, 9:43 AM

## 2018-02-27 NOTE — Progress Notes (Signed)
Patient ID: Jocelyn Hill, female   DOB: 1950/12/12, 67 y.o.   MRN: [id number]   Progress Note  Patient Name: Jocelyn Hill Date of Encounter: 02/27/2018  Primary Cardiologist: No primary care provider on file.   Subjective   Events -02/18/18 CABG/bioprosthetic MVR/Maze and closure of PFO on 02/18/18 -02/26/18 CRT-P placed  On po torsemide. Weight stable. Creatinine stable ~4. INR 1.3  No SOB,orthopnea or PND. Chest sore.   - Echo (8/26): EF 45-50%, global HK, bioprosthetic MV functioning normally, PFO closed.    Inpatient Medications    Scheduled Meds: . amLODipine  10 mg Oral Daily  . aspirin EC  325 mg Oral Daily  . atorvastatin  80 mg Oral Daily  . buPROPion  100 mg Oral TID AC  . chlorhexidine  60 mL Topical Once  . chlorhexidine  60 mL Topical Once  . Chlorhexidine Gluconate Cloth  6 each Topical Daily  . Chlorhexidine Gluconate Cloth  6 each Topical Q0600  . doxazosin  8 mg Oral Daily  . enoxaparin (LOVENOX) injection  30 mg Subcutaneous QHS  . feeding supplement  1 Container Oral TID BM  . ferrous [id number] C-folic acid  1 capsule Oral TID PC  . hydrALAZINE  100 mg Oral Q8H  . isosorbide mononitrate  120 mg Oral Daily  . levothyroxine  112 mcg Oral QAC breakfast  . mouth rinse  15 mL Mouth Rinse BID  . montelukast  10 mg Oral Daily  . pantoprazole  40 mg Oral Daily  . sodium chloride flush  3 mL Intravenous Q12H  . torsemide  80 mg Oral Daily  . warfarin  1 mg Oral q1800  . Warfarin - Physician Dosing Inpatient   Does not apply q1800   Continuous Infusions: . sodium chloride    . sodium chloride    . potassium chloride Stopped (02/23/18 0838)   PRN Meds: acetaminophen, ALPRAZolam, alteplase, calcium carbonate, metoprolol tartrate, morphine injection, ondansetron (ZOFRAN) IV, oxyCODONE, pentafluoroprop-tetrafluoroeth, potassium chloride, sodium chloride flush, sodium chloride flush, sodium chloride flush, traMADol   Vital Signs    Vitals:   02/27/18 0309 02/27/18 0439 02/27/18 0749 02/27/18 1147  BP: (!) 153/88  131/83 (!) 147/88  Pulse: 98   97  Resp: 18  18 17   Temp: 98.9 F (37.2 C)  97.9 F (36.6 C) 97.9 F (36.6 C)  TempSrc: Oral  Oral Oral  SpO2: 92%  97% 98%  Weight:  58.3 kg    Height:        Intake/Output Summary (Last 24 hours) at 02/27/2018 1154 Last data filed at 02/27/2018 0913 Gross per 24 hour  Intake 1200 ml  Output 1750 ml  Net -550 ml   Filed Weights   02/26/18 0043 02/26/18 0500 02/27/18 0439  Weight: 58.3 kg 58.3 kg 58.3 kg    Telemetry    VPaced 90-100 with underlying NSR Personally reviewed   ECG   No new tracings.   Physical Exam   General:  Weak appearing. No resp difficulty HEENT: normal Neck: supple. JVP 6-7. Carotids 2+ bilat; no bruits. No lymphadenopathy or thryomegaly appreciated. Cor: PMI nondisplaced. Sternal wound ok. Pacer dressing ok. No hematoma  Regular rate & rhythm. No rubs, gallops or murmurs. Lungs: clear Abdomen: soft, nontender, nondistended. No hepatosplenomegaly. No bruits or masses. Good bowel sounds. Extremities: no cyanosis, clubbing, rash, edema Neuro: alert & orientedx3, cranial nerves grossly intact. moves all 4 extremities w/o difficulty. Affect pleasant   Labs    Chemistry Recent  Labs  Lab 02/25/18 0500 02/26/18 0300 02/26/18 1828 02/27/18 0411  NA 136 135 134* 135  K 3.1* 3.1* 3.3* 4.0  CL 99 97* 97* 101  CO2 25 25 25 23   GLUCOSE 149* 146* 160* 197*  BUN 60* 52* 49* 46*  CREATININE 4.28* 4.05* 3.92* 4.05*  CALCIUM 8.8* 8.9 8.9 9.0  PROT 5.5*  --   --   --   ALBUMIN 2.6*  --   --  2.8*  AST 35  --   --   --   ALT 31  --   --   --   ALKPHOS 79  --   --   --   BILITOT 0.5  --   --   --   GFRNONAA 10* 11* 11* 11*  GFRAA 11* 12* 13* 12*  ANIONGAP 12 13 12 11      Hematology Recent Labs  Lab 02/21/18 0355 02/22/18 0426 02/24/18 0437  WBC 17.8* 12.5* 8.9  RBC 3.30* 3.16* 3.21*  HGB 9.3* 8.8* 8.9*  HCT 26.9* 25.7* 26.1*  MCV  81.5 81.3 81.3  MCH 28.2 27.8 27.7  MCHC 34.6 34.2 34.1  RDW 15.1 14.8 14.6  PLT 89* 120* 208    Cardiac Enzymes No results for input(s): TROPONINI in the last 168 hours. No results for input(s): TROPIPOC in the last 168 hours.   BNP No results for input(s): BNP, PROBNP in the last 168 hours.   DDimer No results for input(s): DDIMER in the last 168 hours.   Radiology    No results found.  Cardiac Studies   Cath 07/03/2017 1. Low filling pressure and preserved cardiac output.  2. 90% ostial LCx stenosis.  3. Napkin ring-like stenosis in the proximal LAD, at least 60% stenosis.   ECHO 02/10/2018 - Left ventricle: The cavity size was mildly dilated. There was moderate concentric hypertrophy. Systolic function was normal. The estimated ejection fraction was in the range of 55% to 60%. Wall motion was normal; there were no regional wall motion abnormalities. Features are consistent with a pseudonormal left ventricular filling pattern, with concomitant abnormal relaxation and increased filling pressure (grade 2 diastolic dysfunction). Doppler parameters are consistent with high ventricular filling pressure. - Mitral valve: Mild thickening and calcification of the anterior leaflet, consistent with rheumatic disease. Mobility was restricted. Diastolic leaflet doming was present. The findings are consistent with trivial stenosis with mean MV gradient of 48mmHg. There was moderate regurgitation. Effective regurgitant orifice (PISA): 0.17 cm^2. Regurgitant volume (PISA): 24 ml. - Left atrium: The atrium was moderately dilated. - Tricuspid valve: There was mild regurgitation. - Pulmonary arteries: PA peak pressure: 39 mm Hg (S). - Pericardium, extracardiac: A trivial pericardial effusion was identified posterior to the heart.  Impressions:  - Mildly dilated LV with moderate LVF and normal LVF with EF 55-60%. There is moderate LAE, mild TR and  mild pulmonary HTN with PASP 90mmHg. The MV appears rheumatic with thickening and diastolic doming. There appears to be at least moderate MR with ERO of 0.17cm2 and regurgitant volume of 24cc. The right ventricular systolic pressure was increased consistent with mild pulmonary hypertension.   TEE intraop 02/18/2018  Right ventricle: Right ventricular cavity was normal in size. There was normal RV systolic function.  Tricuspid valve: The tricuspid valve appeared structurally normal. There was trace tricuspid insufficiency.  Pulmonic valve: The pulmonic valve appeared structurally normal. There was trace pulmonic insufficiency.  Mitral valve: The mitral leaflets were thickened but appeared to open without restriction. There  was severe mitral regurgitation with a central jet. The ERO by the PISA method was 0.5 cm. The regurgitant volume was 70 cc. There were no flail or prolapsing leaflet segments. On the post bypass exam, there was a bioprosthetic valve in the mitral position. The leaflets opened normally. There was a central jet of mitral insufficiency that was mild. After careful interrogation of the sewing  Right atrium: Cavity is mildly dilated.  Patient Profile     67 y.o. female with severe 2 vessel CAD, severe MR, preserved LVEF, paroxysmal atrial fibrillation, CKD stage 4, severe HTN now s/p CABG/MVR/MAZE on 08/22, had postop complete heart block, subsequently second-degree AV block Mobitz type I  Assessment & Plan    1. CAD s/p CABG:  - No s/s ischemia. Continue ASA and statin.  2. 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 No change.  - On warfarin INR 1.3 3. Heart block, high-grade:  - s/p CRT-P on 8/30 4. AFib s/p MAZE: no afib seen on monitor since surgery - Coumadin restarted. INR 1.29 Discussed dosing with PharmD personally. 5. HTN: SBP 120-150s  - Continue doxazosin 8 mg daily.  - Continue hydralazine  100 mg TID, imdur 120 mg daily, and amlodipine 10 mg daily.  6. Acute on chronic stage 4 renal insufficiency:  Had HD 8/26 but UOP has since improved. Making over 2 liters of urine. Creatinine stable ~4 - Temp HD cath removed - Continue oral diuretics 7. L apical pneumothorax: For now monitoring w/o chest tube. Resolved on CXR yesterday.  8. Acute on chronic primarily diastolic CHF: Still up about 3 lbs from pre-op.  Echo 8/26 with EF 45-50%.  - Volume stable on exam.  - Continue torsemide 80 mg daily.  - No HD for now with good UOP.  9. Anemia: Post-op and renal disease.   10. Right arm pain: Had PICC in right arm, [place] No DVT but with acute superficial vein thrombosis in the cephalic vein. Having numbness/tingling in thumb and first 2 fingers on right hand.  11. Hypokalemia - K 4.0 today 12. Deconditioning - continue PT. Will likely need SNF.   Glori Bickers, MD  02/27/2018, 11:54 AM     Advanced Heart Failure Team Pager [phone] (M-F; 7a - 4p)  Please contact [facility] for night-coverage after hours (4p -7a ) and weekends on [email or web address]

## 2018-02-27 NOTE — Progress Notes (Addendum)
      [address]       [place] [address]             [phone]      1 Day Post-Op Procedure(s) (LRB): BIV PACEMAKER INSERTION CRT-P (N/A) Subjective: She is feeling a little better this morning. Anxious last night and received Xanax.  Objective: Vital signs in last 24 hours: Temp:  [97.7 F (36.5 C)-98.9 F (37.2 C)] 97.9 F (36.6 C) (08/31 0749) Pulse Rate:  [95-99] 98 (08/31 0309) Cardiac Rhythm: Ventricular paced;Normal sinus rhythm (08/31 0700) Resp:  [14-21] 18 (08/31 0749) BP: (120-153)/(76-89) 131/83 (08/31 0749) SpO2:  [92 %-100 %] 97 % (08/31 0749) Weight:  [58.3 kg] 58.3 kg (08/31 0439)     Intake/Output from previous day: 08/30 0701 - 08/31 0700 In: 1140 [P.O.:1110; I.V.:30] Out: 1500 [Urine:1500] Intake/Output this shift: Total I/O In: 120 [P.O.:120] Out: 250 [Urine:250]  General appearance: alert, cooperative and no distress Heart: regular rate and rhythm, S1, S2 normal, no murmur, click, rub or gallop and PPM Lungs: clear to auscultation bilaterally Abdomen: soft, non-tender; bowel sounds normal; no masses,  no organomegaly Extremities: extremities normal, atraumatic, no cyanosis or edema Wound: clean and dry  Lab Results: No results for input(s): WBC, HGB, HCT, PLT in the last 72 hours. BMET:  Recent Labs    02/26/18 1828 02/27/18 0411  NA 134* 135  K 3.3* 4.0  CL 97* 101  CO2 25 23  GLUCOSE 160* 197*  BUN 49* 46*  CREATININE 3.92* 4.05*  CALCIUM 8.9 9.0    PT/INR:  Recent Labs    02/27/18 0411  LABPROT 16.0*  INR 1.29   ABG    Component Value Date/Time   PHART 7.297 (L) 02/19/2018 1053   HCO3 21.3 02/19/2018 1053   TCO2 23 02/19/2018 1702   ACIDBASEDEF 5.0 (H) 02/19/2018 1053   O2SAT 97.0 02/19/2018 1053   CBG (last 3)  No results for input(s): GLUCAP in the last 72 hours.  Assessment/Plan: S/P Procedure(s) (LRB): BIV PACEMAKER INSERTION CRT-P (N/A)  1. CV- s/p PPM yesterday. Will d/c EPW once  interrogated. BP well controlled. Continue Norvasc, ASA, statin, Cardura and low-dose coumadin 2. Pulm-Tolerating room air with good oxygen saturation. Continue Singulair.  3. Renal-On 120mg  Lasix BID. Acute on chronic kidney disease. Creatinine 4.05 this morning. Electrolytes okay.  4. Continue low-dose coumadin. INR 1.29 5. H and H has been stable, expected acute blood loss anemia 6. Diet-carb-modified, tolerating well  7. Continue to work with PT/OT for increased strength and mobility  Plan: Doing well s/p PPM. She is planning to walk in the halls later today. Continue low-dose coumadin for valve thrombus prophylaxis.    LOS: 18 days    Elgie Collard 02/27/2018   I have seen and examined the patient and agree with the assessment and plan as outlined.  Looks remarkably good.  Possibly ready for d/c home some time this week if renal function remains stable.  Rexene Alberts, MD 02/27/2018 12:51 PM   '

## 2018-02-27 NOTE — Progress Notes (Signed)
Patient refuses to keep pulse oximetry on finger. I informed her why she needs to wear it. Will continue to monitor.

## 2018-02-27 NOTE — Progress Notes (Signed)
Pacer wires x 3 pulled per protocol with tips intact. Patient agrees to one hour of bedrest and is resting now.

## 2018-02-27 NOTE — Progress Notes (Signed)
Patient was very anxious. She received an Xanax at approx 2216. She also thought her Oxycodone was scheduled because of how she received it in the past. I informed her that her pain medication is not scheduled and she needs to ask for it.

## 2018-02-27 NOTE — Progress Notes (Signed)
CARDIAC REHAB PHASE I   PRE:  Rate/Rhythm: 103 paced  BP:  Supine:   Sitting: 139/85  Standing:    SaO2: 94%RA  MODE:  Ambulation: 100 ft   POST:  Rate/Rhythm: 110 paced   BP:  Supine: 146/83  Sitting:   Standing:    SaO2: 96%RA 1036-1118 Pt assisted to [facility] and then walked 100 ft on RA with asst x 2 with slow steady pace. Used rolling walker. Pt c/o incisional pain during walk. Would recommend rolling walker for home use. Refer to PT for home recommendations. Encouraged pt to walk as tolerated with assistance. Encouraged IS. Discussed CRP 2 and referred to [facility] program. Discussed staying in the tube with sternal precautions. Back to bed after walk. Left arm on pillow.   Graylon Good, RN BSN  02/27/2018 11:12 AM

## 2018-02-28 LAB — RENAL FUNCTION PANEL
Albumin: 2.8 g/dL — ABNORMAL LOW (ref 3.5–5.0)
Anion gap: 12 (ref 5–15)
BUN: 43 mg/dL — ABNORMAL HIGH (ref 8–23)
CO2: 24 mmol/L (ref 22–32)
Calcium: 8.8 mg/dL — ABNORMAL LOW (ref 8.9–10.3)
Chloride: 99 mmol/L (ref 98–111)
Creatinine, Ser: 4.09 mg/dL — ABNORMAL HIGH (ref 0.44–1.00)
GFR calc Af Amer: 12 mL/min — ABNORMAL LOW (ref >60)
GFR calc non Af Amer: 10 mL/min — ABNORMAL LOW (ref >60)
Glucose, Bld: 152 mg/dL — ABNORMAL HIGH (ref 70–99)
Phosphorus: 3.8 mg/dL (ref 2.5–4.6)
Potassium: 3.2 mmol/L — ABNORMAL LOW (ref 3.5–5.1)
Sodium: 135 mmol/L (ref 135–145)

## 2018-02-28 LAB — PROTIME-INR
INR: 1.27
Prothrombin Time: 15.8 seconds — ABNORMAL HIGH (ref 11.4–15.2)

## 2018-02-28 NOTE — Progress Notes (Signed)
Attempted to ambulate pt ,pt refused explained to pt need to ambulate still refused c/o pain although has had 4 prn on my shift pt has also refused hygiene x 2 days in a row

## 2018-02-28 NOTE — Progress Notes (Addendum)
Physical Therapy Treatment Patient Details Name: Jocelyn Hill MRN: [id number] DOB: February 04, 1951 Today's Date: 02/28/2018    History of Present Illness 67 yo admitted from heart failure clinic with chest pain and SOB. Pt with planned CABG/MVR/MAze for 8/29 but moved up to 8/22. Pt developed heart block post op and required pacer placed 8/30.  PMHx: h/o mitral regurgitation, chronic diastolic CHF, CAD s/p MI, HTN, DM2, CKD IV-V, h/o Stroke, anxiety, depression and GERD    PT Comments    Pt continues to make slow progress. Pt now with pacer placed on 8/30 and c/o pain at insertion site. Pt requested bsc transfer and back but ambulation deferred since pt in the middle of eating lunch. Will return later for amb.   Follow Up Recommendations  SNF     Equipment Recommendations  None recommended by PT    Recommendations for Other Services       Precautions / Restrictions Precautions Precautions: Sternal;Fall;ICD/Pacemaker Precaution Comments: pt able to recall keep arms in tube Restrictions Weight Bearing Restrictions: No    Mobility  Bed Mobility Overal bed mobility: Needs Assistance Bed Mobility: Supine to Sit     Supine to sit: Supervision;HOB elevated     General bed mobility comments: Incr time to come to sitting EOB but no physical assist required. Pt holding heart pillow clasped to chest.   Transfers Overall transfer level: Needs assistance Equipment used: None Transfers: Sit to/from [facility] Sit to Stand: Min assist Stand pivot transfers: Min assist       General transfer comment: Assist to bring hips up and for balance performing stand pivot from bed to bsc to be.  Ambulation/Gait             General Gait Details: Pt in the middle of eating lunch so deferred amb until later.   Stairs             Wheelchair Mobility    Modified Rankin (Stroke Patients Only)       Balance Overall balance assessment: Needs  assistance Sitting-balance support: No upper extremity supported;Feet supported Sitting balance-Leahy Scale: Good     Standing balance support: No upper extremity supported;During functional activity Standing balance-Leahy Scale: Poor Standing balance comment: UE support and min gaurd for static standing                            Cognition Arousal/Alertness: Awake/alert Behavior During Therapy: Flat affect Overall Cognitive Status: Impaired/Different from baseline                                 General Comments: Little initiation of tasks.      Exercises      General Comments        Pertinent Vitals/Pain Pain Assessment: 0-10 Faces Pain Scale: Hurts whole lot Pain Location: Lt upper chest at pacemaker incision. Pain Descriptors / Indicators: Grimacing;Aching;Sharp Pain Intervention(s): Limited activity within patient's tolerance;Monitored during session;Heat applied;Repositioned    Home Living                      Prior Function            PT Goals (current goals can now be found in the care plan section) Acute Rehab PT Goals Patient Stated Goal: feel better PT Goal Formulation: With patient Time For Goal Achievement: 03/14/18 Potential to Achieve Goals: Good  Progress towards PT goals: Progressing toward goals;Goals met and updated - see care plan    Frequency    Min 3X/week      PT Plan Discharge plan needs to be updated    Co-evaluation              AM-PAC PT "6 Clicks" Daily Activity  Outcome Measure  Difficulty turning over in bed (including adjusting bedclothes, sheets and blankets)?: A Lot Difficulty moving from lying on back to sitting on the side of the bed? : A Lot Difficulty sitting down on and standing up from a chair with arms (e.g., wheelchair, bedside commode, etc,.)?: Unable Help needed moving to and from a bed to chair (including a wheelchair)?: A Little Help needed walking in hospital room?: A  Little Help needed climbing 3-5 steps with a railing? : A Lot 6 Click Score: 13    End of Session   Activity Tolerance: Patient tolerated treatment well     PT Visit Diagnosis: Other abnormalities of gait and mobility (R26.89);Muscle weakness (generalized) (M62.81)     Time: 1152-1207 PT Time Calculation (min) (ACUTE ONLY): 15 min  Charges:  $Therapeutic Activity: 8-22 mins                     [place] Pager [phone] Office [place] 02/28/2018, 1:29 PM

## 2018-02-28 NOTE — Progress Notes (Addendum)
Physical Therapy Treatment Patient Details Name: Jocelyn Hill MRN: [id number] DOB: 10-Jul-1950 Today's Date: 02/28/2018    History of Present Illness 67 yo admitted from heart failure clinic with chest pain and SOB. Pt with planned CABG/MVR/MAze for 8/29 but moved up to 8/22. Pt developed heart block post op and required pacer placed 8/30.  PMHx: h/o mitral regurgitation, chronic diastolic CHF, CAD s/p MI, HTN, DM2, CKD IV-V, h/o Stroke, anxiety, depression and GERD    PT Comments    Returned for ambulation and pt able to amb some in hallway. Pt with slow progress. Pt and family agreeable to ST- SNF.    Follow Up Recommendations  SNF     Equipment Recommendations  None recommended by PT    Recommendations for Other Services       Precautions / Restrictions Precautions Precautions: Sternal;Fall;ICD/Pacemaker Precaution Comments: pt able to recall keep arms in tube Restrictions Weight Bearing Restrictions: No    Mobility  Bed Mobility Overal bed mobility: Needs Assistance Bed Mobility: Sit to Sidelying       Sit to sidelying: Min assist General bed mobility comments: Assist to bring legs up into bed  Transfers Overall transfer level: Needs assistance Equipment used: None Transfers: Sit to/from Stand Sit to Stand: Min assist Stand pivot transfers: Min assist       General transfer comment: Assist to bring hips up.  Ambulation/Gait Ambulation/Gait assistance: Min assist Gait Distance (Feet): 120 Feet Assistive device: Rolling walker (2 wheeled) Gait Pattern/deviations: Step-through pattern;Decreased step length - right;Decreased step length - left;Shuffle;Trunk flexed Gait velocity: decr Gait velocity interpretation: <1.8 ft/sec, indicate of risk for recurrent falls General Gait Details: Assist for balance and to push walker on the left side due to pt hesistant to use LUE due to pain after pacer   Stairs             Wheelchair Mobility    Modified  Rankin (Stroke Patients Only)       Balance Overall balance assessment: Needs assistance Sitting-balance support: No upper extremity supported;Feet supported Sitting balance-Leahy Scale: Good     Standing balance support: No upper extremity supported;During functional activity Standing balance-Leahy Scale: Poor Standing balance comment: UE support and min gaurd for static standing                            Cognition Arousal/Alertness: Awake/alert Behavior During Therapy: Flat affect Overall Cognitive Status: Impaired/Different from baseline                                 General Comments: Little initiation of tasks.      Exercises      General Comments        Pertinent Vitals/Pain Pain Assessment: 0-10 Faces Pain Scale: Hurts whole lot Pain Location: Lt upper chest at pacemaker incision. Pain Descriptors / Indicators: Grimacing;Aching;Sharp Pain Intervention(s): Limited activity within patient's tolerance;Monitored during session;Repositioned;Heat applied;Patient requesting pain meds-RN notified    Home Living                      Prior Function            PT Goals (current goals can now be found in the care plan section) Acute Rehab PT Goals Patient Stated Goal: feel better PT Goal Formulation: With patient Time For Goal Achievement: 03/14/18 Potential to Achieve Goals: Good Progress towards  PT goals: Progressing toward goals    Frequency    Min 3X/week      PT Plan Current plan remains appropriate    Co-evaluation              AM-PAC PT "6 Clicks" Daily Activity  Outcome Measure  Difficulty turning over in bed (including adjusting bedclothes, sheets and blankets)?: A Lot Difficulty moving from lying on back to sitting on the side of the bed? : A Lot Difficulty sitting down on and standing up from a chair with arms (e.g., wheelchair, bedside commode, etc,.)?: Unable Help needed moving to and from a bed to  chair (including a wheelchair)?: A Little Help needed walking in hospital room?: A Little Help needed climbing 3-5 steps with a railing? : A Lot 6 Click Score: 13    End of Session   Activity Tolerance: Patient limited by pain;Patient limited by fatigue Patient left: in bed;with call bell/phone within reach;with family/visitor present Nurse Communication: Mobility status;Other (comment)(asking for pain meds) PT Visit Diagnosis: Other abnormalities of gait and mobility (R26.89);Muscle weakness (generalized) (M62.81)     Time: 2353-6144 PT Time Calculation (min) (ACUTE ONLY): 18 min  Charges:  $Gait Training: 8-22 mins $Therapeutic Activity: 8-22 mins                     [place] Pager [phone] Office [place] 02/28/2018, 2:14 PM

## 2018-02-28 NOTE — Progress Notes (Addendum)
      [address]       [place], [address]             [phone]      2 Days Post-Op Procedure(s) (LRB): BIV PACEMAKER INSERTION CRT-P (N/A) Subjective: She is doing a little better today. Tired from working with PT  Objective: Vital signs in last 24 hours: Temp:  [97.5 F (36.4 C)-98.2 F (36.8 C)] 98.2 F (36.8 C) (09/01 0940) Pulse Rate:  [97-102] 102 (09/01 0356) Cardiac Rhythm: Ventricular paced (09/01 0930) Resp:  [13-18] 18 (09/01 0940) BP: (126-157)/(77-88) 133/77 (09/01 1314) SpO2:  [95 %-97 %] 97 % (09/01 0356) Weight:  [57.8 kg] 57.8 kg (09/01 0356)     Intake/Output from previous day: 08/31 0701 - 09/01 0700 In: 702 [P.O.:702] Out: 2300 [Urine:2300] Intake/Output this shift: No intake/output data recorded.  General appearance: alert, cooperative and no distress Heart: regular rate and rhythm, S1, S2 normal, no murmur, click, rub or gallop and v-paced Lungs: clear to auscultation bilaterally Abdomen: soft, non-tender; bowel sounds normal; no masses,  no organomegaly Extremities: extremities normal, atraumatic, no cyanosis or edema Wound: clean and dry  Lab Results: No results for input(s): WBC, HGB, HCT, PLT in the last 72 hours. BMET:  Recent Labs    02/27/18 0411 02/28/18 0427  NA 135 135  K 4.0 3.2*  CL 101 99  CO2 23 24  GLUCOSE 197* 152*  BUN 46* 43*  CREATININE 4.05* 4.09*  CALCIUM 9.0 8.8*    PT/INR:  Recent Labs    02/28/18 0427  LABPROT 15.8*  INR 1.27   ABG    Component Value Date/Time   PHART 7.297 (L) 02/19/2018 1053   HCO3 21.3 02/19/2018 1053   TCO2 23 02/19/2018 1702   ACIDBASEDEF 5.0 (H) 02/19/2018 1053   O2SAT 97.0 02/19/2018 1053   CBG (last 3)  No results for input(s): GLUCAP in the last 72 hours.  Assessment/Plan: S/P Procedure(s) (LRB): BIV PACEMAKER INSERTION CRT-P (N/A)  1. CV- s/pPPM on 8/30. BP well controlled. Continue Norvasc, ASA, statin,Carduraand low-dose coumadin 2.  Pulm-Tolerating room air with good oxygen saturation. Continue Singulair.  3. Renal-On 80mg  of Demadex. Acute on chronic kidney disease.Creatinine 4.09 this morning. Hypokalemia, renal to treat 4. Continue low-dose coumadin. INR 1.27 5. H and H has been stable, expected acute blood loss anemia 6. Diet-carb-modified, tolerating well  7. Continue to work with PT/OT for increased strength and mobility  Plan: Continue to work on optimize renal function. Hopefully home this week   LOS: 19 days    Elgie Collard 02/28/2018  I have seen and examined the patient and agree with the assessment and plan as outlined.  Rexene Alberts, MD 02/28/2018 4:52 PM

## 2018-03-01 LAB — RENAL FUNCTION PANEL
Albumin: 2.9 g/dL — ABNORMAL LOW (ref 3.5–5.0)
Anion gap: 14 (ref 5–15)
BUN: 45 mg/dL — ABNORMAL HIGH (ref 8–23)
CO2: 23 mmol/L (ref 22–32)
Calcium: 9 mg/dL (ref 8.9–10.3)
Chloride: 99 mmol/L (ref 98–111)
Creatinine, Ser: 4.12 mg/dL — ABNORMAL HIGH (ref 0.44–1.00)
GFR calc Af Amer: 12 mL/min — ABNORMAL LOW (ref >60)
GFR calc non Af Amer: 10 mL/min — ABNORMAL LOW (ref >60)
Glucose, Bld: 140 mg/dL — ABNORMAL HIGH (ref 70–99)
Phosphorus: 4.1 mg/dL (ref 2.5–4.6)
Potassium: 3.4 mmol/L — ABNORMAL LOW (ref 3.5–5.1)
Sodium: 136 mmol/L (ref 135–145)

## 2018-03-01 LAB — PROTIME-INR
INR: 1.28
Prothrombin Time: 15.8 seconds — ABNORMAL HIGH (ref 11.4–15.2)

## 2018-03-01 MED ORDER — WARFARIN SODIUM 2.5 MG PO TABS
2.5000 mg | ORAL_TABLET | Freq: Every day | ORAL | Status: DC
  Administered 2018-03-02 – 2018-03-05 (×4): 2.5 mg via ORAL
  Filled 2018-03-01 (×4): qty 1

## 2018-03-01 MED ORDER — WARFARIN SODIUM 5 MG PO TABS: 5.0000 mg | ORAL_TABLET | Freq: Every day | ORAL | Status: DC

## 2018-03-01 MED ORDER — PATIENT'S GUIDE TO USING COUMADIN BOOK
Freq: Once | Status: AC
  Administered 2018-03-01: 16:00:00
  Filled 2018-03-01: qty 1

## 2018-03-01 MED ORDER — METOPROLOL TARTRATE 12.5 MG HALF TABLET
12.5000 mg | ORAL_TABLET | Freq: Two times a day (BID) | ORAL | Status: DC
  Administered 2018-03-01 – 2018-03-02 (×4): 12.5 mg via ORAL
  Filled 2018-03-01 (×4): qty 1

## 2018-03-01 MED ORDER — WARFARIN SODIUM 5 MG PO TABS
5.0000 mg | ORAL_TABLET | Freq: Once | ORAL | Status: AC
  Administered 2018-03-01: 5 mg via ORAL
  Filled 2018-03-01: qty 1

## 2018-03-01 MED ORDER — WARFARIN VIDEO: Freq: Once | Status: DC

## 2018-03-01 NOTE — Progress Notes (Signed)
Occupational Therapy Treatment Patient Details Name: Jocelyn Hill MRN: [id number] DOB: Nov 05, 1950 Today's Date: 03/01/2018    History of present illness 67 yo admitted from heart failure clinic with chest pain and SOB. Pt with planned CABG/MVR/MAze for 8/29 but moved up to 8/22. Pt developed heart block post op and required pacer placed 8/30.  PMHx: h/o mitral regurgitation, chronic diastolic CHF, CAD s/p MI, HTN, DM2, CKD IV-V, h/o Stroke, anxiety, depression and GERD   OT comments  Pt slowly progressing towards established goals. Pt performing toileting with Min A for balance and safety; reviewed compensatory techniques for toilet hygiene. Pt requiring Min A for balance during hallway distance functional mobility with single hand held A. Pt presenting with decreased activity tolerance as seen by fatigue and requiring several rest breaks. Update dc plan to SNF for post-acute rehab and will continue to follow acutely as admitted.    Follow Up Recommendations  SNF;Supervision/Assistance - 24 hour    Equipment Recommendations  3 in 1 bedside commode(Defer to next venue)    Recommendations for Other Services PT consult    Precautions / Restrictions Precautions Precautions: Sternal;Fall;ICD/Pacemaker Precaution Booklet Issued: Yes (comment) Precaution Comments: pt able to recall keep arms in tube Restrictions Weight Bearing Restrictions: No(sternal precautions)       Mobility Bed Mobility Overal bed mobility: Needs Assistance Bed Mobility: Rolling;Sidelying to Sit Rolling: Min assist Sidelying to sit: Min assist       General bed mobility comments: Min A for rolling and then elevating trunk  Transfers Overall transfer level: Needs assistance Equipment used: None Transfers: Sit to/from Stand Sit to Stand: Min assist         General transfer comment: Min A for power up and balance    Balance Overall balance assessment: Needs assistance Sitting-balance support: No upper  extremity supported;Feet supported Sitting balance-Leahy Scale: Good Sitting balance - Comments: able to sit EOB without feet or UE support for 5 minutes w/ supervision. Lightheaded on sitting up, partially resolved w/ time    Standing balance support: During functional activity;Single extremity supported Standing balance-Leahy Scale: Poor Standing balance comment: UE support and min gaurd for static standing                           ADL either performed or assessed with clinical judgement   ADL Overall ADL's : Needs assistance/impaired     Grooming: Wash/dry hands;Standing;Min guard Grooming Details (indicate cue type and reason): Close Min Guard A for safetyu           Upper Body Dressing Details (indicate cue type and reason): Assistance for sling management     Toilet Transfer: Minimal assistance;Ambulation;Regular Glass blower/designer Details (indicate cue type and reason): Min A for power up and safe descent.  Toileting- Clothing Manipulation and Hygiene: Minimal assistance;Sit to/from stand Toileting - Clothing Manipulation Details (indicate cue type and reason): Educating pt on compensatory techniques for toilet hygiene. Required Min A for posterior lean and clothing managment     Functional mobility during ADLs: Minimal assistance(single hand held A) General ADL Comments: Requiring Min A for balance and safety. Pt with significant fatigue and limited activity tolerance. Pt requiring several standing rest breaks during functional mobility     Vision       Perception     Praxis      Cognition Arousal/Alertness: Awake/alert Behavior During Therapy: Flat affect Overall Cognitive Status: Impaired/Different from baseline Area of Impairment: Safety/judgement  Safety/Judgement: Decreased awareness of deficits     General Comments: Pt requiring increased time throughout session for processing        Exercises      Shoulder Instructions       General Comments Son and daughter in law present during session    Pertinent Vitals/ Pain       Pain Assessment: Faces Faces Pain Scale: Hurts whole lot Pain Location: Lt upper chest at pacemaker incision. Pain Descriptors / Indicators: Grimacing;Aching;Sharp Pain Intervention(s): Monitored during session;Limited activity within patient's tolerance;Repositioned  Home Living                                          Prior Functioning/Environment              Frequency  Min 2X/week        Progress Toward Goals  OT Goals(current goals can now be found in the care plan section)  Progress towards OT goals: Progressing toward goals  Acute Rehab OT Goals Patient Stated Goal: feel better OT Goal Formulation: With patient Time For Goal Achievement: 03/01/18 Potential to Achieve Goals: Good ADL Goals Pt Will Perform Grooming: with modified independence;standing Pt Will Perform Lower Body Dressing: with modified independence;sit to/from stand Pt Will Transfer to Toilet: with modified independence;ambulating;regular height toilet Pt Will Perform Toileting - Clothing Manipulation and hygiene: with modified independence;sit to/from stand Pt Will Perform Tub/Shower Transfer: Tub transfer;with supervision;3 in 1;ambulating  Plan Frequency remains appropriate;Discharge plan needs to be updated    Co-evaluation                 AM-PAC PT "6 Clicks" Daily Activity     Outcome Measure   Help from another person eating meals?: None Help from another person taking care of personal grooming?: A Little Help from another person toileting, which includes using toliet, bedpan, or urinal?: A Little Help from another person bathing (including washing, rinsing, drying)?: A Little Help from another person to put on and taking off regular upper body clothing?: A Little Help from another person to put on and taking off regular lower body  clothing?: A Little 6 Click Score: 19    End of Session Equipment Utilized During Treatment: Gait belt  OT Visit Diagnosis: Unsteadiness on feet (R26.81);Other abnormalities of gait and mobility (R26.89);Muscle weakness (generalized) (M62.81);Pain Pain - part of body: (chest, R UE )   Activity Tolerance Patient limited by fatigue;Patient limited by pain   Patient Left with call bell/phone within reach;in chair;with family/visitor present   Nurse Communication Mobility status        Time: 1245-8099 OT Time Calculation (min): 20 min  Charges: OT General Charges $OT Visit: 1 Visit OT Treatments $Self Care/Home Management : 8-22 mins  [place], OTR/L Acute Rehab Pager: [phone] Office: [place] 03/01/2018, 3:52 PM

## 2018-03-01 NOTE — Progress Notes (Signed)
Patient encourage to walk( twice) tonight but patient refused telling I have SOB,V/S is stable.Education and importance of ambulation given to the patient.

## 2018-03-01 NOTE — Progress Notes (Addendum)
      [address]       [place] [address]             [phone]      3 Days Post-Op Procedure(s) (LRB): BIV PACEMAKER INSERTION CRT-P (N/A) Subjective: She states that she is not walking as much as she should.   Objective: Vital signs in last 24 hours: Temp:  [97.7 F (36.5 C)-98.3 F (36.8 C)] 97.7 F (36.5 C) (09/02 0803) Pulse Rate:  [96-99] 99 (09/02 0803) Cardiac Rhythm: Normal sinus rhythm;Other (Comment) (09/02 0724) Resp:  [14-18] 18 (09/02 0803) BP: (125-137)/(77-88) 134/88 (09/02 0803) SpO2:  [93 %-98 %] 98 % (09/02 0803) Weight:  [57.4 kg] 57.4 kg (09/02 0522)     Intake/Output from previous day: 09/01 0701 - 09/02 0700 In: 120 [P.O.:120] Out: 1000 [Urine:1000] Intake/Output this shift: No intake/output data recorded.  General appearance: alert, cooperative and no distress Heart: regular rate and rhythm, S1, S2 normal, no murmur, click, rub or gallop and PPM Lungs: clear to auscultation bilaterally Abdomen: soft, non-tender; bowel sounds normal; no masses,  no organomegaly Extremities: extremities normal, atraumatic, no cyanosis or edema Wound: clean and dry  Lab Results: No results for input(s): WBC, HGB, HCT, PLT in the last 72 hours. BMET:  Recent Labs    02/28/18 0427 03/01/18 0339  NA 135 136  K 3.2* 3.4*  CL 99 99  CO2 24 23  GLUCOSE 152* 140*  BUN 43* 45*  CREATININE 4.09* 4.12*  CALCIUM 8.8* 9.0    PT/INR:  Recent Labs    03/01/18 0339  LABPROT 15.8*  INR 1.28   ABG    Component Value Date/Time   PHART 7.297 (L) 02/19/2018 1053   HCO3 21.3 02/19/2018 1053   TCO2 23 02/19/2018 1702   ACIDBASEDEF 5.0 (H) 02/19/2018 1053   O2SAT 97.0 02/19/2018 1053   CBG (last 3)  No results for input(s): GLUCAP in the last 72 hours.  Assessment/Plan: S/P Procedure(s) (LRB): BIV PACEMAKER INSERTION CRT-P (N/A)  1. CV-s/pPPMon 8/30.BP well controlled. Continue Norvasc, ASA, statin,Carduraand low-dose  coumadin 2. Pulm-Tolerating room air with good oxygen saturation. Continue Singulair.  3. Renal-On 80mg  of Demadex. Acute on chronic kidney disease. Hypokalemia, renal to treat. Creatinine 4.12 this morning.  4. Continue low-dose coumadin. INR 1.28 5. H and H has been stable, expected acute blood loss anemia 6. Diet-carb-modified, tolerating well 7. Continue to work with PT/OT for increased strength and mobility. PT recommending SNF. Will consult case management for placement.   Plan: Encouraged to walk in the halls. Consult to care management for SNF placement. Continue to optimize renal function.    LOS: 20 days    Elgie Collard 03/01/2018   I have seen and examined the patient and agree with the assessment and plan as outlined.  Increase Coumadin dose.  Add low dose beta blocker.  Possibly ready for d/c soon - need to look into arrangements for d/c and discuss w/ patient's family  Rexene Alberts, MD 03/01/2018 10:46 AM

## 2018-03-01 NOTE — Clinical Social Work Note (Signed)
Clinical Social Work Assessment  Patient Details  Name: Jocelyn Hill MRN: [id number] Date of Birth: 1950-12-22  Date of referral:  03/01/18               Reason for consult:  Discharge Planning, Facility Placement                Permission sought to share information with:  Family Supports Permission granted to share information::  Yes, Verbal Permission Granted  Name::     Needles::  snf  Relationship::  daughter  Contact Information:  [phone]  Housing/Transportation Living arrangements for the past 2 months:  [place] of Information:  Patient Patient Interpreter Needed:    Criminal Activity/Legal Involvement Pertinent to Current Situation/Hospitalization:    Significant Relationships:  None Lives with:  Self Do you feel safe going back to the place where you live?  Yes Need for family participation in patient care:  Yes (Comment)  Care giving concerns:  No family at bedside. Patient stated she recognize CSW from her previous admission and is aware of the SNF placement process. Patient has 4 children and has support from other family members. Patient stated she is agreeable to go to rehab but stated she owes a facility in [place] some money so she does not know if she will be able to get back into facility   Social Worker assessment / plan:  Patient stated she would like to go to rehab in [place] because most of her family members are in that area. CSW will follow up with facilities in the area and will follow up with patient once bed offers are available.   Employment status:  Retired Nurse, adult PT Recommendations:  [place] / Referral to community resources:  [place]  Patient/Family's Response to care:  Patient appreciates CSW role in care. patient stated she hopes this is her last hospital stay for a while  Patient/Family's Understanding of and Emotional  Response to Diagnosis, Current Treatment, and Prognosis:  Patient agreeable with discharge plan to snf  Emotional Assessment Appearance:  Appears stated age Attitude/Demeanor/Rapport:  Engaged Affect (typically observed):  Accepting Orientation:  Oriented to Self, Oriented to Place, Oriented to  Time, Oriented to Situation Alcohol / Substance use:  Not Applicable Psych involvement (Current and /or in the community):     Discharge Needs  Concerns to be addressed:  Care Coordination Readmission within the last 30 days:  No Current discharge risk:  Dependent with Mobility Barriers to Discharge:  Continued Medical Work up   [employer], LCSW 03/01/2018, 12:46 PM

## 2018-03-01 NOTE — Progress Notes (Signed)
Pt repeatedly refuse to walk even with education and encouragement to do so

## 2018-03-02 LAB — RENAL FUNCTION PANEL
Albumin: 2.7 g/dL — ABNORMAL LOW (ref 3.5–5.0)
Anion gap: 13 (ref 5–15)
BUN: 45 mg/dL — ABNORMAL HIGH (ref 8–23)
CO2: 24 mmol/L (ref 22–32)
Calcium: 9 mg/dL (ref 8.9–10.3)
Chloride: 98 mmol/L (ref 98–111)
Creatinine, Ser: 4.21 mg/dL — ABNORMAL HIGH (ref 0.44–1.00)
GFR calc Af Amer: 12 mL/min — ABNORMAL LOW (ref >60)
GFR calc non Af Amer: 10 mL/min — ABNORMAL LOW (ref >60)
Glucose, Bld: 156 mg/dL — ABNORMAL HIGH (ref 70–99)
Phosphorus: 4.4 mg/dL (ref 2.5–4.6)
Potassium: 3.1 mmol/L — ABNORMAL LOW (ref 3.5–5.1)
Sodium: 135 mmol/L (ref 135–145)

## 2018-03-02 LAB — PROTIME-INR
INR: 1.39
Prothrombin Time: 16.9 seconds — ABNORMAL HIGH (ref 11.4–15.2)

## 2018-03-02 MED ORDER — POTASSIUM CHLORIDE CRYS ER 20 MEQ PO TBCR: 40.0000 meq | EXTENDED_RELEASE_TABLET | Freq: Once | ORAL | Status: DC

## 2018-03-02 MED ORDER — POTASSIUM CHLORIDE CRYS ER 20 MEQ PO TBCR
40.0000 meq | EXTENDED_RELEASE_TABLET | Freq: Two times a day (BID) | ORAL | Status: AC
  Administered 2018-03-02 – 2018-03-03 (×3): 40 meq via ORAL
  Filled 2018-03-02 (×3): qty 2

## 2018-03-02 NOTE — Progress Notes (Signed)
CARDIAC REHAB PHASE I   PRE:  Rate/Rhythm: 82 pacing  BP:  Sitting: 125/85      SaO2: 97 RA  MODE:  Ambulation: 290 ft   POST:  Rate/Rhythm: 90 pacing  BP:  Sitting: 127/75    SaO2: 98 RA   Pt ambulated 212ft in hallway assist of 2 with gait belt and front wheel walker. Pt with narrow stance, and veers to the left. Pt continues to be reluctant to get up and walk, but does good. Endurance is much improved. Pt c/o slight dizziness, but did not appear SOB. Pt encouraged to walk twice more today.  [id number] Rufina Falco, RN BSN 03/02/2018 9:07 AM

## 2018-03-02 NOTE — Plan of Care (Signed)
  Problem: Education: Goal: Knowledge of General Education information will improve Description Including pain rating scale, medication(s)/side effects and non-pharmacologic comfort measures Outcome: Progressing   Problem: Clinical Measurements: Goal: Ability to maintain clinical measurements within normal limits will improve Outcome: Progressing Goal: Will remain free from infection Outcome: Progressing Goal: Respiratory complications will improve Outcome: Progressing Goal: Cardiovascular complication will be avoided Outcome: Progressing   Problem: Health Behavior/Discharge Planning: Goal: Ability to manage health-related needs will improve Outcome: Not Progressing   Problem: Activity: Goal: Risk for activity intolerance will decrease Outcome: Not Progressing   Problem: Nutrition: Goal: Adequate nutrition will be maintained Outcome: Not Progressing   Problem: Coping: Goal: Level of anxiety will decrease Outcome: Not Progressing

## 2018-03-02 NOTE — Progress Notes (Signed)
Physical Therapy Treatment Patient Details Name: Jocelyn Hill MRN: [id number] DOB: [birth year] Today's Date: 03/02/2018    History of Present Illness 67 yo admitted from heart failure clinic with chest pain and SOB. Pt with planned CABG/MVR/MAze for 8/29 but moved up to 8/22. Pt developed heart block post op and required pacer placed 8/30.  PMHx: h/o mitral regurgitation, chronic diastolic CHF, CAD s/p MI, HTN, DM2, CKD IV-V, h/o Stroke, anxiety, depression and GERD    PT Comments    Pt ambulated 120' with min hand held assist. She reported dizziness with ambulation and required min assist for balance (pt had pain medication on empty stomach prior to PT session). HR 89.  Ambulation distance limited by incisional pain and dizziness.   Follow Up Recommendations  SNF     Equipment Recommendations  None recommended by PT    Recommendations for Other Services       Precautions / Restrictions Precautions Precautions: Sternal;Fall;ICD/Pacemaker Precaution Booklet Issued: Yes (comment)(issued in prior OT session) Precaution Comments: pt able to recall keep arms in tube Restrictions Weight Bearing Restrictions: No(sternal precautions) Other Position/Activity Restrictions: Limit RUE mvmt, blockage and bruising     Mobility  Bed Mobility Overal bed mobility: Needs Assistance Bed Mobility: Sit to Supine       Sit to supine: Min assist   General bed mobility comments: min A for LEs into bed  Transfers Overall transfer level: Needs assistance Equipment used: None Transfers: Sit to/from Stand Sit to Stand: Min guard         General transfer comment: pt hugged pillow to chest with sit to stand  Ambulation/Gait Ambulation/Gait assistance: Min assist;Min guard Gait Distance (Feet): 120 Feet Assistive device: 1 person hand held assist Gait Pattern/deviations: Step-through pattern;Decreased step length - right;Decreased step length - left;Trunk flexed;Narrow base of support Gait  velocity: decr   General Gait Details: pt hugged pillow to chest, PT supported pt at L elbow, mild unsteadiness/dizziness x 3 requiring min A (pt had pain medication on empty stomach 1 hr prior to tx) RN aware, HR 89 walking, distance limited by pain   Stairs             Wheelchair Mobility    Modified Rankin (Stroke Patients Only)       Balance Overall balance assessment: Needs assistance Sitting-balance support: No upper extremity supported;Feet supported Sitting balance-Leahy Scale: Good       Standing balance-Leahy Scale: Fair Standing balance comment: min HHA for dynamic standing balance                            Cognition Arousal/Alertness: Awake/alert Behavior During Therapy: Flat affect Overall Cognitive Status: Within Functional Limits for tasks assessed                                        Exercises      General Comments        Pertinent Vitals/Pain Pain Score: 8  Pain Location: chest at CABG incision site Pain Descriptors / Indicators: Grimacing;Aching;Sharp Pain Intervention(s): Limited activity within patient's tolerance;Monitored during session;Premedicated before session;Patient requesting pain meds-RN notified    Home Living                      Prior Function            PT Goals (  current goals can now be found in the care plan section) Acute Rehab PT Goals Patient Stated Goal: feel better PT Goal Formulation: With patient Time For Goal Achievement: 03/14/18 Potential to Achieve Goals: Good Progress towards PT goals: Progressing toward goals    Frequency    Min 3X/week      PT Plan Current plan remains appropriate    Co-evaluation              AM-PAC PT "6 Clicks" Daily Activity  Outcome Measure  Difficulty turning over in bed (including adjusting bedclothes, sheets and blankets)?: A Lot Difficulty moving from lying on back to sitting on the side of the bed? : A  Lot Difficulty sitting down on and standing up from a chair with arms (e.g., wheelchair, bedside commode, etc,.)?: A Little Help needed moving to and from a bed to chair (including a wheelchair)?: A Little Help needed walking in hospital room?: A Little Help needed climbing 3-5 steps with a railing? : A Lot 6 Click Score: 15    End of Session Equipment Utilized During Treatment: Gait belt Activity Tolerance: Patient limited by pain;Patient limited by fatigue Patient left: in bed;with call bell/phone within reach;with nursing/sitter in room Nurse Communication: Mobility status;Other (comment)(asking for pain meds, dizziness with ambulation) PT Visit Diagnosis: Other abnormalities of gait and mobility (R26.89);Muscle weakness (generalized) (M62.81);Pain     Time: 1638-4665 PT Time Calculation (min) (ACUTE ONLY): 18 min  Charges:  $Gait Training: 8-22 mins                      Blondell Reveal Kistler 03/02/2018, 2:10 PM [phone]

## 2018-03-02 NOTE — Progress Notes (Addendum)
      [address]       [place] [address]             [phone]      4 Days Post-Op Procedure(s) (LRB): BIV PACEMAKER INSERTION CRT-P (N/A) Subjective: She feels like her pacemaker site is warm and may have an infection.   Objective: Vital signs in last 24 hours: Temp:  [97.7 F (36.5 C)-98.4 F (36.9 C)] 98.1 F (36.7 C) (09/03 0323) Pulse Rate:  [84-99] 84 (09/02 1208) Cardiac Rhythm: Ventricular paced (09/03 0350) Resp:  [15-19] 19 (09/03 0135) BP: (110-134)/(66-88) 123/66 (09/02 2342) SpO2:  [96 %-98 %] 98 % (09/03 0323) Weight:  [57 kg] 57 kg (09/03 0135)     Intake/Output from previous day: 09/02 0701 - 09/03 0700 In: 240 [P.O.:240] Out: 650 [Urine:650] Intake/Output this shift: No intake/output data recorded.  General appearance: alert, cooperative and no distress Heart: regular rate and rhythm, S1, S2 normal, no murmur, click, rub or gallop Lungs: clear to auscultation bilaterally Abdomen: soft, non-tender; bowel sounds normal; no masses,  no organomegaly Extremities: extremities normal, atraumatic, no cyanosis or edema Wound: clean and dry  Lab Results: No results for input(s): WBC, HGB, HCT, PLT in the last 72 hours. BMET:  Recent Labs    03/01/18 0339 03/02/18 0346  NA 136 135  K 3.4* 3.1*  CL 99 98  CO2 23 24  GLUCOSE 140* 156*  BUN 45* 45*  CREATININE 4.12* 4.21*  CALCIUM 9.0 9.0    PT/INR:  Recent Labs    03/02/18 0346  LABPROT 16.9*  INR 1.39   ABG    Component Value Date/Time   PHART 7.297 (L) 02/19/2018 1053   HCO3 21.3 02/19/2018 1053   TCO2 23 02/19/2018 1702   ACIDBASEDEF 5.0 (H) 02/19/2018 1053   O2SAT 97.0 02/19/2018 1053   CBG (last 3)  No results for input(s): GLUCAP in the last 72 hours.  Assessment/Plan: S/P Procedure(s) (LRB): BIV PACEMAKER INSERTION CRT-P (N/A)  1. CV-s/pPPMon 8/30.BP well controlled. Continue Norvasc, ASA, statin,Carduraand low-dose coumadin 2. Pulm-Tolerating  room air with good oxygen saturation. Continue Singulair.  3. Renal-On80mg  of Demadex. Acute on chronic kidney disease. Hypokalemia, renal to treat. Creatinine 4.21 this morning.  4. Continue low-dose coumadin. INR 1.39 5. H and H has been stable, expected acute blood loss anemia 6. Diet-carb-modified, tolerating well 7. Continue to work with PT/OT for increased strength and mobility. PT recommending SNF. Will consult case management for placement.   Plan: Encouraged to walk in the halls. Consult to care management for SNF placement. Continue to optimize renal function. Case management spoke with Marissa yesterday and she is trying to arrange a SNF in [place].    LOS: 21 days    Elgie Collard 03/02/2018  I have seen and examined the patient and agree with the assessment and plan as outlined.  Making good progress.  Possibly ready for d/c to SNF 1-2 days  Rexene Alberts, MD 03/02/2018 9:10 AM

## 2018-03-02 NOTE — Progress Notes (Addendum)
Patient ID: Jocelyn Hill, female   DOB: 1950/10/22, 67 y.o.   MRN: [id number]   Progress Note  Patient Name: Jocelyn Hill Date of Encounter: 03/02/2018  Primary Cardiologist: No primary care provider on file.   Subjective   Events -02/18/18 CABG/bioprosthetic MVR/Maze and closure of PFO on 02/18/18 -02/26/18 CRT-P placed  Denies SOB. Left upper chest sore from device placement.    - Echo (8/26): EF 45-50%, global HK, bioprosthetic MV functioning normally, PFO closed.    Inpatient Medications    Scheduled Meds: . amLODipine  10 mg Oral Daily  . aspirin EC  81 mg Oral Daily  . atorvastatin  80 mg Oral Daily  . buPROPion  100 mg Oral TID AC  . doxazosin  8 mg Oral Daily  . enoxaparin (LOVENOX) injection  30 mg Subcutaneous QHS  . feeding supplement  1 Container Oral TID BM  . ferrous [id number] C-folic acid  1 capsule Oral TID PC  . hydrALAZINE  100 mg Oral Q8H  . isosorbide mononitrate  120 mg Oral Daily  . levothyroxine  112 mcg Oral QAC breakfast  . mouth rinse  15 mL Mouth Rinse BID  . metoprolol tartrate  12.5 mg Oral BID  . montelukast  10 mg Oral Daily  . pantoprazole  40 mg Oral Daily  . torsemide  80 mg Oral Daily  . warfarin  2.5 mg Oral q1800  . warfarin   Does not apply Once  . Warfarin - Physician Dosing Inpatient   Does not apply q1800   Continuous Infusions: . potassium chloride Stopped (02/23/18 0838)   PRN Meds: acetaminophen, ALPRAZolam, alteplase, calcium carbonate, metoprolol tartrate, ondansetron (ZOFRAN) IV, oxyCODONE, pentafluoroprop-tetrafluoroeth, potassium chloride, sodium chloride flush, sodium chloride flush, traMADol   Vital Signs    Vitals:   03/01/18 2342 03/02/18 0135 03/02/18 0323 03/02/18 0751  BP: 123/66   122/75  Pulse:    86  Resp: 15 19  19   Temp: 98.4 F (36.9 C)  98.1 F (36.7 C) 97.9 F (36.6 C)  TempSrc: Oral  Oral Oral  SpO2: 96%  98% 96%  Weight:  57 kg    Height:        Intake/Output Summary (Last 24  hours) at 03/02/2018 0821 Last data filed at 03/02/2018 0813 Gross per 24 hour  Intake 360 ml  Output 650 ml  Net -290 ml   Filed Weights   02/28/18 0356 03/01/18 0522 03/02/18 0135  Weight: 57.8 kg 57.4 kg 57 kg    Telemetry    VPaced 90-100 with underlying NSR Personally reviewed   ECG   No new tracings.   Physical Exam  General:   No resp difficulty. In bed.  HEENT: normal Neck: supple. no JVD. Carotids 2+ bilat; no bruits. No lymphadenopathy or thryomegaly appreciated. Cor: PMI nondisplaced. Regular rate & rhythm. No rubs, gallops or murmurs. Sternal incision approximated. L upper chest CDI Lungs: clear Abdomen: soft, nontender, nondistended. No hepatosplenomegaly. No bruits or masses. Good bowel sounds. Extremities: no cyanosis, clubbing, rash, edema. RUE non blanching erythema.  Neuro: alert & orientedx3, cranial nerves grossly intact. moves all 4 extremities w/o difficulty. Affect pleasant   Labs    Chemistry Recent Labs  Lab 02/25/18 0500  02/28/18 0427 03/01/18 0339 03/02/18 0346  NA 136   < > 135 136 135  K 3.1*   < > 3.2* 3.4* 3.1*  CL 99   < > 99 99 98  CO2 25   < >  24 23 24   GLUCOSE 149*   < > 152* 140* 156*  BUN 60*   < > 43* 45* 45*  CREATININE 4.28*   < > 4.09* 4.12* 4.21*  CALCIUM 8.8*   < > 8.8* 9.0 9.0  PROT 5.5*  --   --   --   --   ALBUMIN 2.6*   < > 2.8* 2.9* 2.7*  AST 35  --   --   --   --   ALT 31  --   --   --   --   ALKPHOS 79  --   --   --   --   BILITOT 0.5  --   --   --   --   GFRNONAA 10*   < > 10* 10* 10*  GFRAA 11*   < > 12* 12* 12*  ANIONGAP 12   < > 12 14 13    < > = values in this interval not displayed.     Hematology Recent Labs  Lab 02/24/18 0437  WBC 8.9  RBC 3.21*  HGB 8.9*  HCT 26.1*  MCV 81.3  MCH 27.7  MCHC 34.1  RDW 14.6  PLT 208    Cardiac Enzymes No results for input(s): TROPONINI in the last 168 hours. No results for input(s): TROPIPOC in the last 168 hours.   BNP No results for input(s): BNP,  PROBNP in the last 168 hours.   DDimer No results for input(s): DDIMER in the last 168 hours.   Radiology    No results found.  Cardiac Studies   Cath 07/03/2017 1. Low filling pressure and preserved cardiac output.  2. 90% ostial LCx stenosis.  3. Napkin ring-like stenosis in the proximal LAD, at least 60% stenosis.   ECHO 02/10/2018 - Left ventricle: The cavity size was mildly dilated. There was moderate concentric hypertrophy. Systolic function was normal. The estimated ejection fraction was in the range of 55% to 60%. Wall motion was normal; there were no regional wall motion abnormalities. Features are consistent with a pseudonormal left ventricular filling pattern, with concomitant abnormal relaxation and increased filling pressure (grade 2 diastolic dysfunction). Doppler parameters are consistent with high ventricular filling pressure. - Mitral valve: Mild thickening and calcification of the anterior leaflet, consistent with rheumatic disease. Mobility was restricted. Diastolic leaflet doming was present. The findings are consistent with trivial stenosis with mean MV gradient of 23mmHg. There was moderate regurgitation. Effective regurgitant orifice (PISA): 0.17 cm^2. Regurgitant volume (PISA): 24 ml. - Left atrium: The atrium was moderately dilated. - Tricuspid valve: There was mild regurgitation. - Pulmonary arteries: PA peak pressure: 39 mm Hg (S). - Pericardium, extracardiac: A trivial pericardial effusion was identified posterior to the heart.  Impressions:  - Mildly dilated LV with moderate LVF and normal LVF with EF 55-60%. There is moderate LAE, mild TR and mild pulmonary HTN with PASP 58mmHg. The MV appears rheumatic with thickening and diastolic doming. There appears to be at least moderate MR with ERO of 0.17cm2 and regurgitant volume of 24cc. The right ventricular systolic pressure was increased consistent with  mild pulmonary hypertension.   TEE intraop 02/18/2018  Right ventricle: Right ventricular cavity was normal in size. There was normal RV systolic function.  Tricuspid valve: The tricuspid valve appeared structurally normal. There was trace tricuspid insufficiency.  Pulmonic valve: The pulmonic valve appeared structurally normal. There was trace pulmonic insufficiency.  Mitral valve: The mitral leaflets were thickened but appeared to open without restriction. There  was severe mitral regurgitation with a central jet. The ERO by the PISA method was 0.5 cm. The regurgitant volume was 70 cc. There were no flail or prolapsing leaflet segments. On the post bypass exam, there was a bioprosthetic valve in the mitral position. The leaflets opened normally. There was a central jet of mitral insufficiency that was mild. After careful interrogation of the sewing  Right atrium: Cavity is mildly dilated.  Patient Profile     67 y.o. female with severe 2 vessel CAD, severe MR, preserved LVEF, paroxysmal atrial fibrillation, CKD stage 4, severe HTN now s/p CABG/MVR/MAZE on 08/22, had postop complete heart block, subsequently second-degree AV block Mobitz type I  Assessment & Plan    1. CAD s/p CABG:  - No s/s ischemia. Continue ASA and statin.  2. 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 No change.  - On warfarin INR 1.4  3. Heart block, high-grade:  - s/p CRT-P on 8/30 4. AFib s/p MAZE: no afib seen on monitor since surgery - Coumadin restarted. INR 1.29 Discussed dosing with PharmD personally. 5. HTN:  Better controlled.  - Continue doxazosin 8 mg daily.  - Continue hydralazine 100 mg TID, imdur 120 mg daily, and amlodipine 10 mg daily.  6. Acute on chronic stage 4 renal insufficiency:  Had HD 8/26 but UOP has since improved.  -Creatinine remains 4. Continues to make urine.  7. L apical pneumothorax: For now monitoring w/o chest tube.  Resolved on CXR yesterday.  8. Acute on chronic primarily diastolic CHF: Still up about 3 lbs from pre-op.  Echo 8/26 with EF 45-50%.  - Continue torsemide 80 mg daily.  - HD catheter has been removed.   9. Anemia: Post-op and renal disease.   10. Right arm pain: Had PICC in right arm, [place] No DVT but with acute superficial vein thrombosis in the cephalic vein. Having numbness/tingling in thumb and first 2 fingers on right hand.  11. Hypokalemia -K 3.1 Supp K.  12. Deconditioning - continue PT.  HF Follow up set 0/12 at 2:00    SW following. For SNF.   Darrick Grinder, NP  03/02/2018, 8:21 AM     Advanced Heart Failure Team Pager [phone] (M-F; 7a - 4p)  Please contact [facility] for night-coverage after hours (4p -7a ) and weekends on [email or web address]  Patient seen with NP, agree with the above note. INR rising.  She remains in NSR, BiV pacing.  Creatinine stably high, doing ok on current torsemide dose.  No changes today from HF perspective.   [name] 03/02/2018

## 2018-03-02 NOTE — Progress Notes (Signed)
Nutrition Follow-up  DOCUMENTATION CODES:   Non-severe (moderate) malnutrition in context of chronic illness, Underweight  INTERVENTION:    Boost Breeze po TID, each supplement provides 250 kcal and 9 grams of protein  NUTRITION DIAGNOSIS:   Moderate Malnutrition related to chronic illness (CHF) as evidenced by percent weight loss, energy intake < 75% for > or equal to 1 month, mild fat depletion, moderate fat depletion, mild muscle depletion, moderate muscle depletion, ongoing  GOAL:   Patient will meet greater than or equal to 90% of their needs, progressing  MONITOR:   PO intake, Supplement acceptance, Labs, Weight trends, Skin, I & O's  ASSESSMENT:   Jocelyn Sponsel. Hill is a 67 y.o. female with h/o mitral regurgitation, chronic diastolic CHF, CAD s/p MI, long standing HTN, DM2, CKD IV-V, h/o Stroke. Anxiety, depression and GERD. Patient reported that she has had 2 MIs treated at [facility] in [place] several years ago.  Pt admitted with CHF.  8/22 s/p CABG x 2 with MVR and MAZE 8/23 extubated  8/26 first HD 8/30 pacemaker placed  RD spoke with patient at bedside. She is resting in bed. Reports her appetite "okay". PO intake 85% at breakfast this AM. States she is drinking her Boost Breeze supplements. Labs and medications reviewed. K 3.1 (L). Disp: SNF placement.  Diet Order:   Diet Order            Diet Carb Modified Fluid consistency: Thin; Room service appropriate? Yes  Diet effective now             EDUCATION NEEDS:   Education needs have been addressed  Skin:  Skin Assessment: Skin Integrity Issues: Incisions: closed incisions to R leg and chest  Last BM:  8/29  Height:   Ht Readings from Last 1 Encounters:  02/20/18 5\' 9"  (1.753 m)    Weight:   Wt Readings from Last 1 Encounters:  03/02/18 57 kg    Ideal Body Weight:  65.9 kg  BMI:  Body mass index is 18.56 kg/m.  Estimated Nutritional Needs:   Kcal:   4287-6811  Protein:  95-110 grams  Fluid:  1.7-1.9 L  Arthur Holms, RD, LDN Pager #: [phone] [place] Pager #: [phone]

## 2018-03-02 NOTE — Progress Notes (Signed)
S/p CRT-PPM Friday with Dr. Curt Bears. Implant site is clean and dry, no hematoma, steri-strips in place and intact. CXR done POD #1 reviewed by Dr. Curt Bears, stable lead position, radiology read is withut PTX Device check done POD #1 with intact function Telemetry is SR/V pacing  Wound care and activity restrictions were discussed with the patient today (and are provided in the AVS).  Routine post PPM implant follow up is in place  Please recall EP service if needed.  Tommye Standard, PA-C

## 2018-03-02 NOTE — Progress Notes (Signed)
Patient was offered to go for a walk but refused. Patient c/o pain left chest where pacemaker was placed. Will continue to monitor.

## 2018-03-03 LAB — CBC WITH DIFFERENTIAL/PLATELET
Abs Immature Granulocytes: 0.1 10*3/uL (ref 0.0–0.1)
Basophils Absolute: 0.1 10*3/uL (ref 0.0–0.1)
Basophils Relative: 1 %
Eosinophils Absolute: 0.7 10*3/uL (ref 0.0–0.7)
Eosinophils Relative: 5 %
HCT: 24.4 % — ABNORMAL LOW (ref 36.0–46.0)
Hemoglobin: 8.2 g/dL — ABNORMAL LOW (ref 12.0–15.0)
Immature Granulocytes: 1 %
Lymphocytes Relative: 4 %
Lymphs Abs: 0.5 10*3/uL — ABNORMAL LOW (ref 0.7–4.0)
MCH: 27.2 pg (ref 26.0–34.0)
MCHC: 33.6 g/dL (ref 30.0–36.0)
MCV: 80.8 fL (ref 78.0–100.0)
Monocytes Absolute: 0.6 10*3/uL (ref 0.1–1.0)
Monocytes Relative: 5 %
Neutro Abs: 11.1 10*3/uL — ABNORMAL HIGH (ref 1.7–7.7)
Neutrophils Relative %: 84 %
Platelets: 452 10*3/uL — ABNORMAL HIGH (ref 150–400)
RBC: 3.02 MIL/uL — ABNORMAL LOW (ref 3.87–5.11)
RDW: 14.1 % (ref 11.5–15.5)
WBC: 13 10*3/uL — ABNORMAL HIGH (ref 4.0–10.5)

## 2018-03-03 LAB — BASIC METABOLIC PANEL
Anion gap: 13 (ref 5–15)
BUN: 48 mg/dL — ABNORMAL HIGH (ref 8–23)
CO2: 22 mmol/L (ref 22–32)
Calcium: 8.9 mg/dL (ref 8.9–10.3)
Chloride: 101 mmol/L (ref 98–111)
Creatinine, Ser: 4.14 mg/dL — ABNORMAL HIGH (ref 0.44–1.00)
GFR calc Af Amer: 12 mL/min — ABNORMAL LOW (ref >60)
GFR calc non Af Amer: 10 mL/min — ABNORMAL LOW (ref >60)
Glucose, Bld: 140 mg/dL — ABNORMAL HIGH (ref 70–99)
Potassium: 3.6 mmol/L (ref 3.5–5.1)
Sodium: 136 mmol/L (ref 135–145)

## 2018-03-03 LAB — PROTIME-INR
INR: 1.7
Prothrombin Time: 19.9 seconds — ABNORMAL HIGH (ref 11.4–15.2)

## 2018-03-03 MED ORDER — CARVEDILOL 6.25 MG PO TABS
6.2500 mg | ORAL_TABLET | Freq: Two times a day (BID) | ORAL | Status: DC
  Administered 2018-03-03 – 2018-03-05 (×6): 6.25 mg via ORAL
  Filled 2018-03-03 (×6): qty 1

## 2018-03-03 NOTE — Progress Notes (Signed)
CARDIAC REHAB PHASE I   Offered to walk with pt, pt declining stating her stomach is upset, and she recently received bad news about her son. Completed d/c ed with pt. Encouraged daily showering and monitoring of incisions. Pt given heart healthy diet. Reviewed restrictions and exercise guidelines. Encouraged continued mobility and IS use. Will try to come back and ambulate pt as time allows.  [phone] Rufina Falco, RN BSN 03/03/2018 10:14 AM

## 2018-03-03 NOTE — Progress Notes (Addendum)
Patient ID: Jocelyn Hill, female   DOB: [birth year], 67 y.o.   MRN: [id number]   Progress Note  Patient Name: Jocelyn Hill Date of Encounter: 03/03/2018  Primary Cardiologist: No primary care provider on file.   Subjective   Events -02/18/18 CABG/bioprosthetic MVR/Maze and closure of PFO on 02/18/18 -02/26/18 CRT-P placed  Complaining of nausea after she had a bowel movement. Denies SOB.    - Echo (8/26): EF 45-50%, global HK, bioprosthetic MV functioning normally, PFO closed.    Inpatient Medications    Scheduled Meds: . amLODipine  10 mg Oral Daily  . aspirin EC  81 mg Oral Daily  . atorvastatin  80 mg Oral Daily  . buPROPion  100 mg Oral TID AC  . doxazosin  8 mg Oral Daily  . enoxaparin (LOVENOX) injection  30 mg Subcutaneous QHS  . feeding supplement  1 Container Oral TID BM  . ferrous [id number] C-folic acid  1 capsule Oral TID PC  . hydrALAZINE  100 mg Oral Q8H  . isosorbide mononitrate  120 mg Oral Daily  . levothyroxine  112 mcg Oral QAC breakfast  . mouth rinse  15 mL Mouth Rinse BID  . metoprolol tartrate  12.5 mg Oral BID  . montelukast  10 mg Oral Daily  . pantoprazole  40 mg Oral Daily  . potassium chloride  40 mEq Oral BID  . torsemide  80 mg Oral Daily  . warfarin  2.5 mg Oral q1800  . warfarin   Does not apply Once  . Warfarin - Physician Dosing Inpatient   Does not apply q1800   Continuous Infusions: . potassium chloride Stopped (02/23/18 0838)   PRN Meds: acetaminophen, ALPRAZolam, alteplase, calcium carbonate, metoprolol tartrate, ondansetron (ZOFRAN) IV, oxyCODONE, pentafluoroprop-tetrafluoroeth, potassium chloride, sodium chloride flush, sodium chloride flush, traMADol   Vital Signs    Vitals:   03/02/18 1401 03/02/18 2035 03/02/18 2308 03/03/18 0511  BP: 114/65 119/72 122/77 (!) 143/85  Pulse: 87     Resp: 18  19 17   Temp: 97.9 F (36.6 C) (!) 97.5 F (36.4 C) 97.8 F (36.6 C) 97.6 F (36.4 C)  TempSrc: Oral Oral Oral Oral    SpO2: 97% 96% 98% 98%  Weight:    56.7 kg  Height:        Intake/Output Summary (Last 24 hours) at 03/03/2018 0805 Last data filed at 03/03/2018 0301 Gross per 24 hour  Intake 360 ml  Output 675 ml  Net -315 ml   Filed Weights   03/01/18 0522 03/02/18 0135 03/03/18 0511  Weight: 57.4 kg 57 kg 56.7 kg    Telemetry    Biv Pacing 80s    ECG   No new tracings.   Physical Exam  General: Sitting on the side of the bed.  No resp difficulty HEENT: normal Neck: supple. no JVD. Carotids 2+ bilat; no bruits. No lymphadenopathy or thryomegaly appreciated. Cor: PMI nondisplaced. Regular rate & rhythm. No rubs, gallops or murmurs. Left upper chest steri strips. Sternal incision approximated.  Lungs: clear Abdomen: soft, nontender, nondistended. No hepatosplenomegaly. No bruits or masses. Good bowel sounds. Extremities: no cyanosis, clubbing, rash, edema Neuro: alert & orientedx3, cranial nerves grossly intact. moves all 4 extremities w/o difficulty. Affect pleasant   Labs    Chemistry Recent Labs  Lab 02/25/18 0500  02/28/18 0427 03/01/18 0339 03/02/18 0346 03/03/18 0415  NA 136   < > 135 136 135 136  K 3.1*   < > 3.2* 3.4* 3.1*  3.6  CL 99   < > 99 99 98 101  CO2 25   < > 24 23 24 22   GLUCOSE 149*   < > 152* 140* 156* 140*  BUN 60*   < > 43* 45* 45* 48*  CREATININE 4.28*   < > 4.09* 4.12* 4.21* 4.14*  CALCIUM 8.8*   < > 8.8* 9.0 9.0 8.9  PROT 5.5*  --   --   --   --   --   ALBUMIN 2.6*   < > 2.8* 2.9* 2.7*  --   AST 35  --   --   --   --   --   ALT 31  --   --   --   --   --   ALKPHOS 79  --   --   --   --   --   BILITOT 0.5  --   --   --   --   --   GFRNONAA 10*   < > 10* 10* 10* 10*  GFRAA 11*   < > 12* 12* 12* 12*  ANIONGAP 12   < > 12 14 13 13    < > = values in this interval not displayed.     Hematology Recent Labs  Lab 03/03/18 0415  WBC 13.0*  RBC 3.02*  HGB 8.2*  HCT 24.4*  MCV 80.8  MCH 27.2  MCHC 33.6  RDW 14.1  PLT 452*    Cardiac  Enzymes No results for input(s): TROPONINI in the last 168 hours. No results for input(s): TROPIPOC in the last 168 hours.   BNP No results for input(s): BNP, PROBNP in the last 168 hours.   DDimer No results for input(s): DDIMER in the last 168 hours.   Radiology    No results found.  Cardiac Studies   Cath 07/03/2017 1. Low filling pressure and preserved cardiac output.  2. 90% ostial LCx stenosis.  3. Napkin ring-like stenosis in the proximal LAD, at least 60% stenosis.   ECHO 02/10/2018 - Left ventricle: The cavity size was mildly dilated. There was moderate concentric hypertrophy. Systolic function was normal. The estimated ejection fraction was in the range of 55% to 60%. Wall motion was normal; there were no regional wall motion abnormalities. Features are consistent with a pseudonormal left ventricular filling pattern, with concomitant abnormal relaxation and increased filling pressure (grade 2 diastolic dysfunction). Doppler parameters are consistent with high ventricular filling pressure. - Mitral valve: Mild thickening and calcification of the anterior leaflet, consistent with rheumatic disease. Mobility was restricted. Diastolic leaflet doming was present. The findings are consistent with trivial stenosis with mean MV gradient of 8mmHg. There was moderate regurgitation. Effective regurgitant orifice (PISA): 0.17 cm^2. Regurgitant volume (PISA): 24 ml. - Left atrium: The atrium was moderately dilated. - Tricuspid valve: There was mild regurgitation. - Pulmonary arteries: PA peak pressure: 39 mm Hg (S). - Pericardium, extracardiac: A trivial pericardial effusion was identified posterior to the heart.  Impressions:  - Mildly dilated LV with moderate LVF and normal LVF with EF 55-60%. There is moderate LAE, mild TR and mild pulmonary HTN with PASP 82mmHg. The MV appears rheumatic with thickening and diastolic doming. There  appears to be at least moderate MR with ERO of 0.17cm2 and regurgitant volume of 24cc. The right ventricular systolic pressure was increased consistent with mild pulmonary hypertension.   TEE intraop 02/18/2018  Right ventricle: Right ventricular cavity was normal in size. There was normal RV  systolic function.  Tricuspid valve: The tricuspid valve appeared structurally normal. There was trace tricuspid insufficiency.  Pulmonic valve: The pulmonic valve appeared structurally normal. There was trace pulmonic insufficiency.  Mitral valve: The mitral leaflets were thickened but appeared to open without restriction. There was severe mitral regurgitation with a central jet. The ERO by the PISA method was 0.5 cm. The regurgitant volume was 70 cc. There were no flail or prolapsing leaflet segments. On the post bypass exam, there was a bioprosthetic valve in the mitral position. The leaflets opened normally. There was a central jet of mitral insufficiency that was mild. After careful interrogation of the sewing  Right atrium: Cavity is mildly dilated.  Patient Profile     67 y.o. female with severe 2 vessel CAD, severe MR, preserved LVEF, paroxysmal atrial fibrillation, CKD stage 4, severe HTN now s/p CABG/MVR/MAZE on 08/22, had postop complete heart block, subsequently second-degree AV block Mobitz type I  Assessment & Plan    1. CAD s/p CABG:  - No s/s ischemia. Continue ASA and statin.  2. 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 No change.  - On warfarin INR 1.7  3. Heart block, high-grade:  - s/p CRT-P on 8/30 - Stable.  4. AFib s/p MAZE: no afib seen on monitor since surgery - Coumadin restarted. INR 1.70 Discussed dosing with PharmD personally. 5. HTN:  - stable.  - Continue doxazosin 8 mg daily.  - Continue hydralazine 100 mg TID, imdur 120 mg daily, and amlodipine 10 mg daily.  6. Acute on chronic stage 4 renal  insufficiency:  Had HD 8/26 but UOP has since improved.  Creatinine 4.1  Continue 80 mg torsemide daily.  7. L apical pneumothorax: For now monitoring w/o chest tube. Resolved on CXR yesterday.  8. Acute on chronic primarily diastolic CHF:  Echo 0/10 with EF 45-50%.  From HF perspective she is stable -Volume status stable.  - Continue torsemide 80 mg daily.  - HD catheter has been removed.   9. Anemia: Post-op and renal disease.   10. Right arm pain: Had PICC in right arm, [place] No DVT but with acute superficial vein thrombosis in the cephalic vein. Ongoing numbness in thumb and 1st 2 fingers.   11. Hypokalemia -Stable 3.6 12. Deconditioning - continue PT.  Heart failure team will sign off as of 03/03/18  HF Medication Recommendations for Home: Torsemide 80 mg daily  Amlodipine 10 mg daily Doxazosin 8 mg daily  Hydralazine 100 mg three times a day  Imdur 120 mg daily  Coreg 6.25 mg bid Kdur 40 meq twice a day  Atorvastatin 80 mg daily  Warfarin per pharmacy  Other recommendations (Labs,testing, etc): BMET at her follow up appointment.   Follow up as an outpatient: HF clinic set up and on chart. Will need coumadin clinic followup.    Darrick Grinder, NP  03/03/2018, 8:05 AM     Advanced Heart Failure Team Pager [phone] (M-F; 7a - 4p)  Please contact [facility] for night-coverage after hours (4p -7a ) and weekends on [email or web address]  Patient seen with NP, agree with the above note.    She is stable today, still with some pain and nausea.  Creatinine stable.  BP still runs high at times.  She is BiV pacing appropriately.   Would transition her from metoprolol to Coreg 6.25 mg bid.  Volume stable on current torsemide.   Would send her to SNF on the above  medical regimen.  Can stop ASA when INR therapeutic on warfarin.   We will followup in CHF clinic.   [name] 03/03/2018 8:31 AM

## 2018-03-03 NOTE — Care Management Important Message (Signed)
Important Message  Patient Details  Name: Jocelyn Hill MRN: [id number] Date of Birth: Jun 24, 1951   Medicare Important Message Given:  Yes  Patient was unable to sigh. Unsigned copy left at bedside.    Roberto Hlavaty P Elleen Coulibaly 03/03/2018, 4:18 PM

## 2018-03-03 NOTE — Progress Notes (Addendum)
      [address]       [place] [address]             [phone]      5 Days Post-Op Procedure(s) (LRB): BIV PACEMAKER INSERTION CRT-P (N/A) Subjective: Shares that her stomach is hurting her this morning. She just had a bowel movement  Objective: Vital signs in last 24 hours: Temp:  [97.5 F (36.4 C)-97.9 F (36.6 C)] 97.6 F (36.4 C) (09/04 0511) Pulse Rate:  [87] 87 (09/03 1401) Cardiac Rhythm: Ventricular paced (09/03 2000) Resp:  [17-19] 17 (09/04 0511) BP: (114-143)/(65-85) 143/85 (09/04 0511) SpO2:  [96 %-98 %] 98 % (09/04 0511) Weight:  [56.7 kg] 56.7 kg (09/04 0511)     Intake/Output from previous day: 09/03 0701 - 09/04 0700 In: 360 [P.O.:360] Out: 675 [Urine:675] Intake/Output this shift: No intake/output data recorded.  General appearance: alert, cooperative and no distress Heart: paced Lungs: clear to auscultation bilaterally Abdomen: soft, non-tender; bowel sounds normal; no masses,  no organomegaly Extremities: extremities normal, atraumatic, no cyanosis or edema Wound: clean and dry  Lab Results: Recent Labs    03/03/18 0415  WBC 13.0*  HGB 8.2*  HCT 24.4*  PLT 452*   BMET:  Recent Labs    03/02/18 0346 03/03/18 0415  NA 135 136  K 3.1* 3.6  CL 98 101  CO2 24 22  GLUCOSE 156* 140*  BUN 45* 48*  CREATININE 4.21* 4.14*  CALCIUM 9.0 8.9    PT/INR:  Recent Labs    03/03/18 0415  LABPROT 19.9*  INR 1.70   ABG    Component Value Date/Time   PHART 7.297 (L) 02/19/2018 1053   HCO3 21.3 02/19/2018 1053   TCO2 23 02/19/2018 1702   ACIDBASEDEF 5.0 (H) 02/19/2018 1053   O2SAT 97.0 02/19/2018 1053   CBG (last 3)  No results for input(s): GLUCAP in the last 72 hours.  Assessment/Plan: S/P Procedure(s) (LRB): BIV PACEMAKER INSERTION CRT-P (N/A)  1. CV-s/pPPMon 8/30.BP well controlled. Continue Norvasc, ASA, statin,Carduraand low-dose coumadin 2. Pulm-Tolerating room air with good oxygen saturation.  Continue Singulair.  3. Renal-On80mg  of Demadex. Acute on chronic kidney disease. Creatinine 4.14 this morning.Will need good follow-up outpatient with nephrologist.  4. Continue low-dose coumadin. INR 1.70 5. H and H has been stable, expected acute blood loss anemia 6. Diet-carb-modified, tolerating well, supplementing with boost breeze TID 7. Continue to work with PT/OT for increased strength and mobility. PT recommending SNF. Case management working on placement.  Plan: No note from case management yesterday. Will reach out today and determine SNF placement progress. She is doing well from a medical standpoint and can be discharged once she has a bed.    LOS: 22 days    Elgie Collard 03/03/2018  I have seen and examined the patient and agree with the assessment and plan as outlined.  Looks ready for D/C to SNF  Rexene Alberts, MD 03/03/2018 9:08 AM

## 2018-03-03 NOTE — NC FL2 (Signed)
[place] LEVEL OF CARE SCREENING TOOL     IDENTIFICATION  Patient Name: Jocelyn Hill Birthdate: 09-21-1950 Sex: female Admission Date (Current Location): 02/09/2018  [facility] and [place] Number:  [employer] and Address:  [facility] [place], [place] [address], [place], [place] [address]      Provider Number: [id number]  Attending Physician Name and Address:  Rexene Alberts, MD  Relative Name and Phone Number:  Remee Charley - daughter; [phone]    Current Level of Care: Hospital Recommended Level of Care: [place] Prior Approval Number:    Date Approved/Denied:   PASRR Number: [id number] A(Eff. 03/30/17)  Discharge Plan: SNF    Current Diagnoses: Patient Active Problem List   Diagnosis Date Noted  . S/P CABG x 2 02/18/2018  . S/P mitral valve replacement with bioprosthetic valve + CABG x2 + maze procedure 02/18/2018  . S/P patent foramen ovale closure 02/18/2018  . S/P Maze operation for atrial fibrillation 02/18/2018  . Malnutrition of moderate degree 02/11/2018  . Chest pain due to CAD 02/09/2018  . AF (paroxysmal atrial fibrillation) ([place])   . Coronary artery disease involving native coronary artery of native heart   . Rheumatic mitral regurgitation   . Acute renal failure superimposed on stage 4 chronic kidney disease ([place])   . Controlled type 2 diabetes mellitus with diabetic nephropathy ([place])   . Anxiety   . Protein-calorie malnutrition, severe 04/21/2017  . Substance abuse ([place])   . Acute on chronic diastolic CHF (congestive heart failure) ([place])   . Pulmonary hypertension ([place])   . Anemia due to chronic kidney disease   . Controlled diabetes mellitus type 2 with complications ([place])   . Severe mitral regurgitation   . Stage 4 chronic kidney disease ([place])   . Microcytic anemia 03/13/2017  . Hyperlipidemia 03/12/2017  . Hypertension 03/12/2017  . Hypothyroidism 03/12/2017  . Depression 03/12/2017   . CHF (congestive heart failure) ([place]) 03/12/2017  . Dysphagia 03/12/2017    Orientation RESPIRATION BLADDER Height & Weight     Self, Time, Place  Normal Continent Weight: 124 lb 14.4 oz (56.7 kg) Height:  5\' 9"  (175.3 cm)  BEHAVIORAL SYMPTOMS/MOOD NEUROLOGICAL BOWEL NUTRITION STATUS      Continent Diet(Carb modified)  AMBULATORY STATUS COMMUNICATION OF NEEDS Skin   Limited Assist Verbally Other (Comment)(Incision left upper chest, Incision right leg)                       Personal Care Assistance Level of Assistance  Bathing, Feeding, Dressing Bathing Assistance: Limited assistance Feeding assistance: Limited assistance(Assistance with set-up) Dressing Assistance: Limited assistance     Functional Limitations Info  Sight, Hearing, Speech Sight Info: Adequate Hearing Info: Adequate Speech Info: Adequate    SPECIAL CARE FACTORS FREQUENCY  PT (By licensed PT), OT (By licensed OT)     PT Frequency: Evaluated 8/15 and a minimum of 3X per week therapy recommended during acute inpatient stay OT Frequency: Evaluated 8/19 and a minimum of 2X per week therapy recommended during acute inpatient stay              Contractures Contractures Info: Not present    Additional Factors Info  Code Status, Allergies Code Status Info: Full Allergies Info: Ace Inhibitors, Motrin Ib           Current Medications (03/03/2018):  This is the current hospital active medication list Current Facility-Administered Medications  Medication Dose Route Frequency Provider  Last Rate Last Dose  . acetaminophen (TYLENOL) tablet 325-650 mg  325-650 mg Oral Q4H PRN Camnitz, Will Hassell Done, MD      . ALPRAZolam Duanne Moron) tablet 0.25 mg  0.25 mg Oral QHS PRN Rexene Alberts, MD   0.25 mg at 03/02/18 2109  . alteplase (CATHFLO ACTIVASE) injection 2 mg  2 mg Intracatheter Once PRN Rexene Alberts, MD      . amLODipine (NORVASC) tablet 10 mg  10 mg Oral Daily Rexene Alberts, MD   10 mg at 03/03/18  0836  . aspirin EC tablet 81 mg  81 mg Oral Daily Rexene Alberts, MD   81 mg at 03/03/18 1032  . atorvastatin (LIPITOR) tablet 80 mg  80 mg Oral Daily Rexene Alberts, MD   80 mg at 03/03/18 1032  . buPROPion [facility]) tablet 100 mg  100 mg Oral TID AC Rexene Alberts, MD   100 mg at 03/03/18 1249  . calcium carbonate (TUMS - dosed in mg elemental calcium) chewable tablet 400 mg of elemental calcium  2 tablet Oral TID PRN Rexene Alberts, MD      . carvedilol (COREG) tablet 6.25 mg  6.25 mg Oral BID WC Larey Dresser, MD   6.25 mg at 03/03/18 4287  . doxazosin (CARDURA) tablet 8 mg  8 mg Oral Daily Larey Dresser, MD   8 mg at 03/03/18 0836  . feeding supplement (BOOST / RESOURCE BREEZE) liquid 1 Container  1 Container Oral TID BM Rexene Alberts, MD   1 Container at 03/03/18 1032  . ferrous [id number] C-folic acid (TRINSICON / FOLTRIN) capsule 1 capsule  1 capsule Oral TID PC Rexene Alberts, MD   1 capsule at 03/03/18 1249  . hydrALAZINE (APRESOLINE) tablet 100 mg  100 mg Oral Q8H Rexene Alberts, MD   100 mg at 03/03/18 3845  . isosorbide mononitrate (IMDUR) 24 hr tablet 120 mg  120 mg Oral Daily Rexene Alberts, MD   120 mg at 03/03/18 0835  . levothyroxine (SYNTHROID, LEVOTHROID) tablet 112 mcg  112 mcg Oral QAC breakfast Rexene Alberts, MD   112 mcg at 03/03/18 3646  . MEDLINE mouth rinse  15 mL Mouth Rinse BID Rexene Alberts, MD   15 mL at 03/01/18 2113  . metoprolol tartrate (LOPRESSOR) injection 2.5-5 mg  2.5-5 mg Intravenous Q2H PRN Rexene Alberts, MD   5 mg at 02/23/18 8032  . montelukast (SINGULAIR) tablet 10 mg  10 mg Oral Daily Rexene Alberts, MD   10 mg at 03/03/18 1032  . ondansetron (ZOFRAN) injection 4 mg  4 mg Intravenous Q6H PRN Constance Haw, MD   4 mg at 03/03/18 0825  . oxyCODONE (Oxy IR/ROXICODONE) immediate release tablet 5-10 mg  5-10 mg Oral Q3H PRN Rexene Alberts, MD   10 mg at 03/03/18 0837  . pantoprazole (PROTONIX) EC tablet  40 mg  40 mg Oral Daily Rexene Alberts, MD   40 mg at 03/03/18 0836  . pentafluoroprop-tetrafluoroeth (GEBAUERS) aerosol 1 application  1 application Topical PRN Rexene Alberts, MD      . potassium chloride 10 mEq in 50 mL *CENTRAL LINE* IVPB  10 mEq Intravenous Q1H PRN Rexene Alberts, MD   Stopped at 02/23/18 [phone]  . sodium chloride flush (NS) 0.9 % injection 10-40 mL  10-40 mL Intracatheter PRN Rexene Alberts, MD      . sodium chloride flush (NS) 0.9 %  injection 3 mL  3 mL Intravenous PRN Rexene Alberts, MD      . torsemide [facility]) tablet 80 mg  80 mg Oral Daily Georgiana Shore, NP   80 mg at 03/03/18 1031  . traMADol (ULTRAM) tablet 50-100 mg  50-100 mg Oral Q4H PRN Rexene Alberts, MD   100 mg at 03/03/18 1249  . warfarin (COUMADIN) tablet 2.5 mg  2.5 mg Oral q1800 Rexene Alberts, MD   2.5 mg at 03/02/18 1643  . warfarin (COUMADIN) video   Does not apply Once Pat Patrick, RPH      . Warfarin - Physician Dosing Inpatient   Does not apply q1800 Rexene Alberts, MD         Discharge Medications: Please see discharge summary for a list of discharge medications.  Relevant Imaging Results:  Relevant Lab Results:   Additional Information [phone]  Sable Feil, LCSW

## 2018-03-03 NOTE — Progress Notes (Signed)
Occupational Therapy Treatment Patient Details Name: EDITHE DOBBIN MRN: [id number] DOB: [birth year] Today's Date: 03/03/2018    History of present illness 67 yo admitted from heart failure clinic with chest pain and SOB. Pt with planned CABG/MVR/MAze for 8/29 but moved up to 8/22. Pt developed heart block post op and required pacer placed 8/30.  PMHx: h/o mitral regurgitation, chronic diastolic CHF, CAD s/p MI, HTN, DM2, CKD IV-V, h/o Stroke, anxiety, depression and GERD   OT comments  Pt with flat affect. Reports she is worried about her son who is hospitalized in [place]. Required encouragement for ADL training. Pt ambulating with min guard assist, reaching for furniture to stabilize to ambulate to the bathroom. Performed toileting and standing grooming with min to min guard assist. Educated pt in importance of increasing activity.  Follow Up Recommendations  SNF;Supervision/Assistance - 24 hour    Equipment Recommendations  3 in 1 bedside commode    Recommendations for Other Services      Precautions / Restrictions Precautions Precautions: Sternal;Fall;ICD/Pacemaker Precaution Comments: pt able to recall keep arms in tube       Mobility Bed Mobility Overal bed mobility: Needs Assistance Bed Mobility: Sit to Supine       Sit to supine: Min assist   General bed mobility comments: min A for LEs into bed  Transfers   Equipment used: None Transfers: Sit to/from Stand Sit to Stand: Min guard              Balance Overall balance assessment: Needs assistance   Sitting balance-Leahy Scale: Good       Standing balance-Leahy Scale: Fair                             ADL either performed or assessed with clinical judgement   ADL Overall ADL's : Needs assistance/impaired     Grooming: Oral care;Standing;Min guard           Upper Body Dressing : Minimal assistance;Sitting       Toilet Transfer: Min guard;Ambulation;Regular Toilet(pt reaching for  sink, furniture)   [place] and Hygiene: Minimal assistance;Sit to/from stand Toileting - Clothing Manipulation Details (indicate cue type and reason): min assist for gown     Functional mobility during ADLs: Min guard(furniture walking)       Vision       Perception     Praxis      Cognition Arousal/Alertness: Awake/alert Behavior During Therapy: Flat affect Overall Cognitive Status: Within Functional Limits for tasks assessed                                 General Comments: pt reports she was just notified that her son is hospitalized in [facility]        Exercises     Shoulder Instructions       General Comments      Pertinent Vitals/ Pain       Pain Assessment: Faces Faces Pain Scale: Hurts even more Pain Location: chest at CABG incision site Pain Descriptors / Indicators: Grimacing;Aching;Sharp Pain Intervention(s): Monitored during session;Repositioned;Premedicated before session  Home Living                                          Prior Functioning/Environment  Frequency  Min 2X/week        Progress Toward Goals  OT Goals(current goals can now be found in the care plan section)  Progress towards OT goals: Progressing toward goals  Acute Rehab OT Goals Patient Stated Goal: feel better OT Goal Formulation: With patient Time For Goal Achievement: 03/08/18 Potential to Achieve Goals: Good  Plan Discharge plan remains appropriate    Co-evaluation                 AM-PAC PT "6 Clicks" Daily Activity     Outcome Measure   Help from another person eating meals?: None Help from another person taking care of personal grooming?: A Little Help from another person toileting, which includes using toliet, bedpan, or urinal?: A Little Help from another person bathing (including washing, rinsing, drying)?: A Little Help from another person to put on and taking off regular  upper body clothing?: A Little Help from another person to put on and taking off regular lower body clothing?: A Little 6 Click Score: 19    End of Session    OT Visit Diagnosis: Unsteadiness on feet (R26.81);Other abnormalities of gait and mobility (R26.89);Muscle weakness (generalized) (M62.81);Pain   Activity Tolerance (self limiting)   Patient Left in bed;with call bell/phone within reach   Nurse Communication          Time: 7106-2694 OT Time Calculation (min): 15 min  Charges: OT General Charges $OT Visit: 1 Visit OT Treatments $Self Care/Home Management : 8-22 mins  03/03/2018 Nestor Lewandowsky, OTR/L Pager: [phone]   Werner Lean, Haze Boyden 03/03/2018, 10:16 AM

## 2018-03-04 LAB — BASIC METABOLIC PANEL
Anion gap: 12 (ref 5–15)
BUN: 49 mg/dL — ABNORMAL HIGH (ref 8–23)
CO2: 20 mmol/L — ABNORMAL LOW (ref 22–32)
Calcium: 9 mg/dL (ref 8.9–10.3)
Chloride: 101 mmol/L (ref 98–111)
Creatinine, Ser: 4.02 mg/dL — ABNORMAL HIGH (ref 0.44–1.00)
GFR calc Af Amer: 12 mL/min — ABNORMAL LOW (ref >60)
GFR calc non Af Amer: 11 mL/min — ABNORMAL LOW (ref >60)
Glucose, Bld: 145 mg/dL — ABNORMAL HIGH (ref 70–99)
Potassium: 4.2 mmol/L (ref 3.5–5.1)
Sodium: 133 mmol/L — ABNORMAL LOW (ref 135–145)

## 2018-03-04 LAB — PROTIME-INR
INR: 2.07
Prothrombin Time: 23.1 seconds — ABNORMAL HIGH (ref 11.4–15.2)

## 2018-03-04 MED ORDER — ALPRAZOLAM 0.25 MG PO TABS
0.2500 mg | ORAL_TABLET | Freq: Three times a day (TID) | ORAL | Status: DC | PRN
  Administered 2018-03-04 – 2018-03-05 (×4): 0.25 mg via ORAL
  Filled 2018-03-04 (×4): qty 1

## 2018-03-04 NOTE — Progress Notes (Signed)
Physical Therapy Treatment Patient Details Name: Jocelyn Hill MRN: [id number] DOB: 1950/12/12 Today's Date: 03/04/2018    History of Present Illness Pt is a 67 y.o. admitted 02/09/18 from heart failure clinic with chest pain and SOB. Pt with planned CABG/MVR/MAZE for 8/29 but moved up to 8/22. Pt developed heart block post op and required pacer placed 8/30.  PMH includes mitral regurgitation, chronic diastolic CHF, CAD s/p MI, HTN, DM2, CKD IV-V, CVA, anxiety, depression.   PT Comments    Pt declining gait training secondary to pain and fatigue. Agreeable to therex, including repeated sit<>stands without UE support and cervical/shoulder ROM within precaution parameters. RN notified pt requesting medication. Continue to recommend SNF-level therapies to maximize functional mobility and independence prior to return home. Will follow acutely.    Follow Up Recommendations  SNF;Supervision for mobility/OOB     Equipment Recommendations  None recommended by PT    Recommendations for Other Services       Precautions / Restrictions Precautions Precautions: Sternal;Fall;ICD/Pacemaker Precaution Comments: Able to recall sternal precautions    Mobility  Bed Mobility Overal bed mobility: Needs Assistance Bed Mobility: Rolling;Sidelying to Sit;Sit to Sidelying Rolling: Supervision Sidelying to sit: Min assist;HOB elevated     Sit to sidelying: Supervision General bed mobility comments: MinA to assist trunk elevation. Pt prefer to roll onto L-side although c/o L shoulder/chest pain  Transfers Overall transfer level: Needs assistance Equipment used: None Transfers: Sit to/from Stand Sit to Stand: Min guard         General transfer comment: Performed 5x sit<>stand while hugging heart pillow to chest. Increased reliance on momentum with fatigue. Min guard for balance  Ambulation/Gait                 Stairs             Wheelchair Mobility    Modified Rankin (Stroke  Patients Only)       Balance Overall balance assessment: Needs assistance Sitting-balance support: No upper extremity supported;Feet supported Sitting balance-Leahy Scale: Good       Standing balance-Leahy Scale: Fair Standing balance comment: Can static stand with no UE support                            Cognition Arousal/Alertness: Awake/alert Behavior During Therapy: Flat affect Overall Cognitive Status: Within Functional Limits for tasks assessed                                        Exercises Other Exercises Other Exercises: Pt with limited/painful cervical AROM, especially rotation (R>L) - educ cervical ROM/stretching and shoulder rolls    General Comments        Pertinent Vitals/Pain Pain Assessment: Faces Faces Pain Scale: Hurts even more Pain Location: LUE at incision site Pain Descriptors / Indicators: Grimacing;Aching;Sharp Pain Intervention(s): Monitored during session;Limited activity within patient's tolerance;Heat applied    Home Living                      Prior Function            PT Goals (current goals can now be found in the care plan section) Acute Rehab PT Goals Patient Stated Goal: Less chest/shoulder pain PT Goal Formulation: With patient Time For Goal Achievement: 03/14/18 Potential to Achieve Goals: Good Progress towards PT goals: Progressing toward goals  Frequency    Min 3X/week      PT Plan Current plan remains appropriate    Co-evaluation              AM-PAC PT "6 Clicks" Daily Activity  Outcome Measure  Difficulty turning over in bed (including adjusting bedclothes, sheets and blankets)?: A Little Difficulty moving from lying on back to sitting on the side of the bed? : Unable Difficulty sitting down on and standing up from a chair with arms (e.g., wheelchair, bedside commode, etc,.)?: Unable Help needed moving to and from a bed to chair (including a wheelchair)?: A  Little Help needed walking in hospital room?: A Little Help needed climbing 3-5 steps with a railing? : A Lot 6 Click Score: 13    End of Session   Activity Tolerance: Patient limited by pain Patient left: in bed;with call bell/phone within reach Nurse Communication: Mobility status;Other (comment)(asking for nausea meds) PT Visit Diagnosis: Other abnormalities of gait and mobility (R26.89);Muscle weakness (generalized) (M62.81);Pain     Time: 1517-6160 PT Time Calculation (min) (ACUTE ONLY): 22 min  Charges:  $Therapeutic Exercise: 8-22 mins                     Mabeline Caras, PT, DPT Acute Rehabilitation Services  Pager [phone] Office [place] 03/04/2018, 3:31 PM

## 2018-03-04 NOTE — Clinical Social Work Note (Addendum)
Received text from admissions staff person at [facility] (4:36 pm) that auth received on patient. MD will be notified and patient will discharge to facility on Friday.  Maclaine Ahola Givens, MSW, LCSW Licensed Clinical Social Worker [place] [phone]

## 2018-03-04 NOTE — Progress Notes (Addendum)
      [address]       ,[place] [address]             [phone]      6 Days Post-Op Procedure(s) (LRB): BIV PACEMAKER INSERTION CRT-P (N/A) Subjective: The patient stated that she was unable to eat much yesterday due to anxiety. Her son is in the hospital with a bad infection and she is worried about him. She is still having some pain over her pacemaker site which she is treating with heat.   Objective: Vital signs in last 24 hours: Temp:  [97.5 F (36.4 C)-98.7 F (37.1 C)] 98.7 F (37.1 C) (09/05 0446) Pulse Rate:  [79-85] 81 (09/05 0007) Cardiac Rhythm: Ventricular paced (09/04 1900) Resp:  [14-22] 16 (09/05 0446) BP: (110-137)/(71-82) 137/81 (09/05 0446) SpO2:  [91 %-94 %] 92 % (09/05 0446) Weight:  [57.5 kg] 57.5 kg (09/05 0446)     Intake/Output from previous day: 09/04 0701 - 09/05 0700 In: 360 [P.O.:360] Out: 550 [Urine:550] Intake/Output this shift: No intake/output data recorded.  General appearance: alert, cooperative and no distress Heart: regular rate and rhythm, S1, S2 normal, no murmur, click, rub or gallop and paced Lungs: clear to auscultation bilaterally Abdomen: soft, non-tender; bowel sounds normal; no masses,  no organomegaly Extremities: extremities normal, atraumatic, no cyanosis or edema Wound: clean and dry  Lab Results: Recent Labs    03/03/18 0415  WBC 13.0*  HGB 8.2*  HCT 24.4*  PLT 452*   BMET:  Recent Labs    03/03/18 0415 03/04/18 0407  NA 136 133*  K 3.6 4.2  CL 101 101  CO2 22 20*  GLUCOSE 140* 145*  BUN 48* 49*  CREATININE 4.14* 4.02*  CALCIUM 8.9 9.0    PT/INR:  Recent Labs    03/04/18 0407  LABPROT 23.1*  INR 2.07   ABG    Component Value Date/Time   PHART 7.297 (L) 02/19/2018 1053   HCO3 21.3 02/19/2018 1053   TCO2 23 02/19/2018 1702   ACIDBASEDEF 5.0 (H) 02/19/2018 1053   O2SAT 97.0 02/19/2018 1053   CBG (last 3)  No results for input(s): GLUCAP in the last 72  hours.  Assessment/Plan: S/P Procedure(s) (LRB): BIV PACEMAKER INSERTION CRT-P (N/A)  1. CV-s/pPPMon 8/30.BP well controlled. Continue Norvasc, ASA, statin,Carduraand low-dose coumadin. Cardiology signed off.  2. Pulm-Tolerating room air with good oxygen saturation. Continue Singulair.  3. Renal-On80mg  of Demadex. Acute on chronic kidney disease. Creatinine 4.02this morning.Will need good follow-up outpatient with nephrologist.  4. Continue low-dose coumadin. INR 2.07 5. H and H has been stable, expected acute blood loss anemia 6. Diet-carb-modified, tolerating well, supplementing with boost breeze TID 7. Continue to work with PT/OT for increased strength and mobility. PT recommending SNF.   Plan: Using Xanax TID PRN for anxiety. Continue to ambulate. She is stable from a medical standpoint for SNF. Case management and social work following and assisting with placement.    LOS: 23 days    Elgie Collard 03/04/2018 Patient seen and examined, agree with above  Remo Lipps C. Roxan Hockey, MD [facility] [phone]

## 2018-03-05 DIAGNOSIS — Z950 Presence of cardiac pacemaker: Secondary | ICD-10-CM

## 2018-03-05 LAB — PROTIME-INR
INR: 2.11
Prothrombin Time: 23.4 seconds — ABNORMAL HIGH (ref 11.4–15.2)

## 2018-03-05 LAB — BASIC METABOLIC PANEL
Anion gap: 15 (ref 5–15)
BUN: 50 mg/dL — ABNORMAL HIGH (ref 8–23)
CO2: 21 mmol/L — ABNORMAL LOW (ref 22–32)
Calcium: 9.5 mg/dL (ref 8.9–10.3)
Chloride: 100 mmol/L (ref 98–111)
Creatinine, Ser: 4.33 mg/dL — ABNORMAL HIGH (ref 0.44–1.00)
GFR calc Af Amer: 11 mL/min — ABNORMAL LOW (ref >60)
GFR calc non Af Amer: 10 mL/min — ABNORMAL LOW (ref >60)
Glucose, Bld: 138 mg/dL — ABNORMAL HIGH (ref 70–99)
Potassium: 3.5 mmol/L (ref 3.5–5.1)
Sodium: 136 mmol/L (ref 135–145)

## 2018-03-05 MED ORDER — DOXAZOSIN MESYLATE 8 MG PO TABS: 8.0000 mg | ORAL_TABLET | Freq: Every day | ORAL | Status: DC

## 2018-03-05 MED ORDER — OXYCODONE HCL 5 MG PO TABS: 5.0000 mg | ORAL_TABLET | ORAL | 0 refills | Status: DC | PRN

## 2018-03-05 MED ORDER — HYDRALAZINE HCL 100 MG PO TABS: 100.0000 mg | ORAL_TABLET | Freq: Three times a day (TID) | ORAL | 3 refills | Status: DC

## 2018-03-05 MED ORDER — ATORVASTATIN CALCIUM 80 MG PO TABS: 80.0000 mg | ORAL_TABLET | Freq: Every day | ORAL | 3 refills | Status: AC

## 2018-03-05 MED ORDER — TORSEMIDE 20 MG PO TABS: 80.0000 mg | ORAL_TABLET | Freq: Every day | ORAL | 3 refills | Status: DC

## 2018-03-05 MED ORDER — CARVEDILOL 6.25 MG PO TABS: 6.2500 mg | ORAL_TABLET | Freq: Two times a day (BID) | ORAL | 3 refills | Status: DC

## 2018-03-05 MED ORDER — WARFARIN SODIUM 2.5 MG PO TABS: 2.5000 mg | ORAL_TABLET | Freq: Every day | ORAL | 3 refills | Status: DC

## 2018-03-05 NOTE — Progress Notes (Signed)
Called RN at [facility] and gave report.  All questions answered. RN aware that transportation has not arrived to pick up pt. Payton Emerald, RN

## 2018-03-05 NOTE — Progress Notes (Signed)
      [address]       [place] [address]             [phone]        7 Days Post-Op Procedure(s) (LRB): BIV PACEMAKER INSERTION CRT-P (N/A)  Subjective: Patient sitting in chair without specific complaints this am.  Objective: Vital signs in last 24 hours: Temp:  [98 F (36.7 C)-98.6 F (37 C)] 98 F (36.7 C) (09/06 0449) Pulse Rate:  [77-86] 77 (09/05 1600) Cardiac Rhythm: Heart block;Other (Comment) (09/06 0711) Resp:  [12-18] 13 (09/06 0449) BP: (118-134)/(74-80) 128/76 (09/06 0449) SpO2:  [90 %-95 %] 91 % (09/06 0449) Weight:  [57.1 kg] 57.1 kg (09/06 0449)  Pre op weight 56.5 kg Current Weight  03/05/18 57.1 kg       Intake/Output from previous day: 09/05 0701 - 09/06 0700 In: 720 [P.O.:720] Out: 900 [Urine:900]   Physical Exam:  Cardiovascular: Paced Pulmonary: Clear to auscultation bilaterally Abdomen: Soft, non tender, bowel sounds present. Extremities: No lower extremity edema. Wounds: Clean and dry.  No erythema or signs of infection.  Lab Results: CBC: Recent Labs    03/03/18 0415  WBC 13.0*  HGB 8.2*  HCT 24.4*  PLT 452*   BMET:  Recent Labs    03/03/18 0415 03/04/18 0407  NA 136 133*  K 3.6 4.2  CL 101 101  CO2 22 20*  GLUCOSE 140* 145*  BUN 48* 49*  CREATININE 4.14* 4.02*  CALCIUM 8.9 9.0    PT/INR:  Lab Results  Component Value Date   INR 2.11 03/05/2018   INR 2.07 03/04/2018   INR 1.70 03/03/2018   ABG:  INR: Will add last result for INR, ABG once components are confirmed Will add last 4 CBG results once components are confirmed  Assessment/Plan:  1. CV - V paced, first degree heart block.On Amlodipine 10 mg daily, Coreg 6.25 mg bid, Hydralazine 100 mg tid, and Coumadin. INR slightly increased to 2.11 2.  Pulmonary - On room air. Encourage incentive spirometer. 3. Volume Overload - On Demadex 80 mg daily. 4.  Anemia of chronic disease - H and H 8.2 and 24.4 5. ESRD-creatinine this am  4.33. Will need to follow up with nephrologist after discharge. 6. Continue PT/OT. To SNF  Morrissa Shein M ZimmermanPA-C 03/05/2018,8:12 AM [phone]

## 2018-03-05 NOTE — Care Management Note (Addendum)
Case Management Note Marvetta Gibbons RN, BSN Unit 4E- RN Care Coordinator  [phone]  Patient Details  Name: Jocelyn Hill MRN: [id number] Date of Birth: [birth year]  Subjective/Objective:  Pt s/p MVR and CABG, post op with CHB and need for PPM- which was placed 8/30               Action/Plan: PTA pt lived at home, initially was planned to go home with daughter with [facility] services however  per PT/OT evals recommendations for SNF, CSW consulted for placement needs- pt to transition to [facility].   Expected Discharge Date:  03/05/18               Expected Discharge Plan:  Home/Self Care  In-House Referral:  Clinical Social Work  Discharge planning Services  CM Consult  Post Acute Care Choice:  NA Choice offered to:  Patient  DME Arranged:    DME Agency:     HH Arranged:  PT [place] Agency:  [place]  Status of Service:  Completed, signed off  If discussed at [place] of Stay Meetings, dates discussed:    Discharge Disposition: skilled facility    Additional Comments:  Dawayne Patricia, RN 03/05/2018, 1:57 PM

## 2018-03-05 NOTE — Progress Notes (Signed)
CARDIAC REHAB PHASE I   PRE:  Rate/Rhythm: paced 87  BP:  Supine:   Sitting: 128/76  Standing:    SaO2: 93%RA  MODE:  Ambulation: 300 ft   POST:  Rate/Rhythm: 93 paced  BP:  Supine:   Sitting: 151/85  Standing:    SaO2: 94%RA 0930-1025 Pt walked 300 ft on RA with rolling walker and asst x 1. Gait fairly steady. Tolerated well. To recliner. Gave warm blanket.    Graylon Good, RN BSN  03/05/2018 10:26 AM

## 2018-03-05 NOTE — Discharge Instructions (Signed)
We ask the SNF to please do the following: 1. Please obtain vital signs at least one time daily 2.Please weigh the patient daily. If he or she continues to gain weight or develops lower extremity edema, contact the office at [phone]. 3. Ambulate patient at least three times daily and please use sternal precautions.  Discharge Instructions:  1. You may shower, please wash incisions daily with soap and water and keep dry.  If you wish to cover wounds with dressing you may do so but please keep clean and change daily.  No tub baths or swimming until incisions have completely healed.  If your incisions become red or develop any drainage please call our office at [phone]  2. No Driving until cleared by Dr. Guy Sandifer office and you are no longer using narcotic pain medications  3. Monitor your weight daily.. Please use the same scale and weigh at same time... If you gain 5-10 lbs in 48 hours with associated lower extremity swelling, please contact our office at [phone]  4. Fever of 101.5 for at least 24 hours with no source, please contact our office at [phone]  5. Activity- up as tolerated, please walk at least 3 times per day.  Avoid strenuous activity, no lifting, pushing, or pulling with your arms over 8-10 lbs for a minimum of 6 weeks  6. If any questions or concerns arise, please do not hesitate to contact our office at [phone]          Supplemental Discharge Instructions for  Pacemaker/Defibrillator Patients  Activity No heavy lifting or vigorous activity with your left/right arm for 6 to 8 weeks.  Do not raise your left/right arm above your head for one week.  Gradually raise your affected arm as drawn below.              03/02/18                      03/03/18                      03/04/18                    03/05/18 __  NO DRIVING until cleared to at follow up with surgeon  [place] the wound area clean and dry.  Do not get this area wet, no showers  until cleared to at your pacemaker wound check visit . - The tape/steri-strips on your wound will fall off; do not pull them off.  No bandage is needed on the site.  DO  NOT apply any creams, oils, or ointments to the wound area. - If you notice any drainage or discharge from the wound, any swelling or bruising at the site, or you develop a fever > 101? F after you are discharged home, call the office at once.  Special Instructions - You are still able to use cellular telephones; use the ear opposite the side where you have your pacemaker/defibrillator.  Avoid carrying your cellular phone near your device. - When traveling through airports, show security personnel your identification card to avoid being screened in the metal detectors.  Ask the security personnel to use the hand wand. - Avoid arc welding equipment, MRI testing (magnetic resonance imaging), TENS units (transcutaneous nerve stimulators).  Call the office for questions about other devices. - Avoid electrical appliances that are in poor condition or are not properly grounded. - Microwave ovens are safe  to be near or to operate.  Additional information for defibrillator patients should your device go off: - If your device goes off ONCE and you feel fine afterward, notify the device clinic nurses. - If your device goes off ONCE and you do not feel well afterward, call 911. - If your device goes off TWICE, call 911. - If your device goes off THREE times in one day, call 911.  DO NOT DRIVE YOURSELF OR A FAMILY MEMBER WITH A DEFIBRILLATOR TO THE HOSPITAL--CALL 911.      Information on my medicine - Coumadin   (Warfarin)  This medication education was reviewed with me or my healthcare representative as part of my discharge preparation.  The pharmacist that spoke with me during my hospital stay was:  Betsey Holiday, [facility]  Why was Coumadin prescribed for you? Coumadin was prescribed for you because you have a blood clot or a  medical condition that can cause an increased risk of forming blood clots. Blood clots can cause serious health problems by blocking the flow of blood to the heart, lung, or brain. Coumadin can prevent harmful blood clots from forming. As a reminder your indication for Coumadin is:   Select from menu  What test will check on my response to Coumadin? While on Coumadin (warfarin) you will need to have an INR test regularly to ensure that your dose is keeping you in the desired range. The INR (international normalized ratio) number is calculated from the result of the laboratory test called prothrombin time (PT).  If an INR APPOINTMENT HAS NOT ALREADY BEEN MADE FOR YOU please schedule an appointment to have this lab work done by your health care provider within 7 days. Your INR goal is usually a number between:  2 to 3 or your provider may give you a more narrow range like 2-2.5.  Ask your health care provider during an office visit what your goal INR is.  What  do you need to  know  About  COUMADIN? Take Coumadin (warfarin) exactly as prescribed by your healthcare provider about the same time each day.  DO NOT stop taking without talking to the doctor who prescribed the medication.  Stopping without other blood clot prevention medication to take the place of Coumadin may increase your risk of developing a new clot or stroke.  Get refills before you run out.  What do you do if you miss a dose? If you miss a dose, take it as soon as you remember on the same day then continue your regularly scheduled regimen the next day.  Do not take two doses of Coumadin at the same time.  Important Safety Information A possible side effect of Coumadin (Warfarin) is an increased risk of bleeding. You should call your healthcare provider right away if you experience any of the following: ? Bleeding from an injury or your nose that does not stop. ? Unusual colored urine (red or dark brown) or unusual colored stools (red  or black). ? Unusual bruising for unknown reasons. ? A serious fall or if you hit your head (even if there is no bleeding).  Some foods or medicines interact with Coumadin (warfarin) and might alter your response to warfarin. To help avoid this: ? Eat a balanced diet, maintaining a consistent amount of Vitamin K. ? Notify your provider about major diet changes you plan to make. ? Avoid alcohol or limit your intake to 1 drink for women and 2 drinks for men per day. (  1 drink is 5 oz. wine, 12 oz. beer, or 1.5 oz. liquor.)  Make sure that ANY health care provider who prescribes medication for you knows that you are taking Coumadin (warfarin).  Also make sure the healthcare provider who is monitoring your Coumadin knows when you have started a new medication including herbals and non-prescription products.  Coumadin (Warfarin)  Major Drug Interactions  Increased Warfarin Effect Decreased Warfarin Effect  Alcohol (large quantities) Antibiotics (esp. Septra/Bactrim, Flagyl, Cipro) Amiodarone (Cordarone) Aspirin (ASA) Cimetidine (Tagamet) Megestrol (Megace) NSAIDs (ibuprofen, naproxen, etc.) Piroxicam (Feldene) Propafenone (Rythmol SR) Propranolol (Inderal) Isoniazid (INH) Posaconazole (Noxafil) Barbiturates (Phenobarbital) Carbamazepine (Tegretol) Chlordiazepoxide (Librium) Cholestyramine (Questran) Griseofulvin Oral Contraceptives Rifampin Sucralfate (Carafate) Vitamin K   Coumadin (Warfarin) Major Herbal Interactions  Increased Warfarin Effect Decreased Warfarin Effect  Garlic Ginseng Ginkgo biloba Coenzyme Q10 Green tea St. Johns wort    Coumadin (Warfarin) FOOD Interactions  Eat a consistent number of servings per week of foods HIGH in Vitamin K (1 serving =  cup)  Collards (cooked, or boiled & drained) Kale (cooked, or boiled & drained) Mustard greens (cooked, or boiled & drained) Parsley *serving size only =  cup Spinach (cooked, or boiled & drained) Swiss  chard (cooked, or boiled & drained) Turnip greens (cooked, or boiled & drained)  Eat a consistent number of servings per week of foods MEDIUM-HIGH in Vitamin K (1 serving = 1 cup)  Asparagus (cooked, or boiled & drained) Broccoli (cooked, boiled & drained, or raw & chopped) Brussel sprouts (cooked, or boiled & drained) *serving size only =  cup Lettuce, raw (green leaf, endive, romaine) Spinach, raw Turnip greens, raw & chopped   These websites have more information on Coumadin (warfarin):  FailFactory.se; VeganReport.com.au;

## 2018-03-05 NOTE — Progress Notes (Signed)
Pt encouraged to ambulate but does not want to walk due to chest pain. Pt ambulates in room to restroom. Pt OOB to chair with encouragement. Pt resting with call bell within reach.  Will continue to monitor.

## 2018-03-05 NOTE — Progress Notes (Signed)
Clinical Social Worker facilitated patient discharge including contacting patient family and facility to confirm patient discharge plans.  Clinical information faxed to facility and family agreeable with plan.  CSW arranged ambulance transport via [facility] to [facility].  RN to call [phone] ask for charge RN (pt will go to rm# 11)  report prior to discharge.  Clinical Social Worker will sign off for now as social work intervention is no longer needed. Please consult [place] again if new need arises.  Jocelyn Hill, MSW, [place]

## 2018-03-08 ENCOUNTER — Telehealth: Payer: Self-pay | Admitting: Cardiology

## 2018-03-08 NOTE — Telephone Encounter (Signed)
Patient called and stated that when they plugged her home monitor up patient told them that her device was getting hot on the inside of her chest. I informed patient daughter that the home monitor will not affect the home monitor. I explained to the patient daughter that the pacemaker talks to the home monitor and the home monitor talks to [place]. The only function that the monitor has to communicate the information. I informed pt daughter that the home monitor is connecting and updated as of 03-08-18. I informed pt daughter that the only thing that needs to plugged into the monitor is the cell adapter and the electrical plug. Pt daughter verbalized understanding.

## 2018-03-09 ENCOUNTER — Ambulatory Visit (INDEPENDENT_AMBULATORY_CARE_PROVIDER_SITE_OTHER): Payer: Medicare Other | Admitting: *Deleted

## 2018-03-09 DIAGNOSIS — I5032 Chronic diastolic (congestive) heart failure: Secondary | ICD-10-CM | POA: Diagnosis not present

## 2018-03-09 DIAGNOSIS — I441 Atrioventricular block, second degree: Secondary | ICD-10-CM | POA: Diagnosis not present

## 2018-03-09 DIAGNOSIS — I480 Paroxysmal atrial fibrillation: Secondary | ICD-10-CM

## 2018-03-09 LAB — CUP PACEART INCLINIC DEVICE CHECK
Battery Remaining Longevity: 46 mo
Battery Voltage: 2.99 V
Brady Statistic RA Percent Paced: 0.03 %
Brady Statistic RV Percent Paced: 98 %
Date Time Interrogation Session: 20190910105735
Implantable Lead Implant Date: 20190830
Implantable Lead Implant Date: 20190830
Implantable Lead Implant Date: 20190830
Implantable Lead Location: 753858
Implantable Lead Location: 753859
Implantable Lead Location: 753860
Implantable Pulse Generator Implant Date: 20190830
Lead Channel Impedance Value: 450 Ohm
Lead Channel Impedance Value: 462.5 Ohm
Lead Channel Impedance Value: 637.5 Ohm
Lead Channel Pacing Threshold Amplitude: 0.75 V
Lead Channel Pacing Threshold Amplitude: 0.75 V
Lead Channel Pacing Threshold Amplitude: 1 V
Lead Channel Pacing Threshold Amplitude: 1 V
Lead Channel Pacing Threshold Amplitude: 1.5 V
Lead Channel Pacing Threshold Amplitude: 1.5 V
Lead Channel Pacing Threshold Pulse Width: 0.5 ms
Lead Channel Pacing Threshold Pulse Width: 0.5 ms
Lead Channel Pacing Threshold Pulse Width: 0.5 ms
Lead Channel Pacing Threshold Pulse Width: 0.5 ms
Lead Channel Pacing Threshold Pulse Width: 0.5 ms
Lead Channel Pacing Threshold Pulse Width: 0.5 ms
Lead Channel Sensing Intrinsic Amplitude: 3 mV
Lead Channel Sensing Intrinsic Amplitude: 8.5 mV
Lead Channel Setting Pacing Amplitude: 3.5 V
Lead Channel Setting Pacing Amplitude: 3.5 V
Lead Channel Setting Pacing Amplitude: 3.5 V
Lead Channel Setting Pacing Pulse Width: 0.5 ms
Lead Channel Setting Pacing Pulse Width: 0.5 ms
Lead Channel Setting Sensing Sensitivity: 2 mV
Pulse Gen Serial Number: 9043873

## 2018-03-09 NOTE — Progress Notes (Signed)
Wound check appointment. Steri-strips removed. Wound without redness and drainage. Hematoma noted, localized to directly over device. Dr. Curt Bears visualized hematoma- patient was advised to monitor size and call Device Clinic if swelling increased (noted in facility paperwork as well). Incision edges approximated, wound well healed. Normal device function. Thresholds, sensing, and impedances consistent with implant measurements. Device programmed at 3.5V for extra safety margin until 3 month visit. Histogram distribution appropriate for patient and level of activity. 98% BiV pacing. 3 mode switches <1%- longest 22sec, AF, on warfarin for recent MVR. No high ventricular rates noted. Patient educated about wound care, arm mobility, lifting restrictions and [place] monitoring. ROV with WC 06/01/18- will reschedule for [place] at a later date if able. Pt c/o right arm numbness and lower arm wound since hospitalization- extravasation injury noted on anterior forearm. Small area of skin peeling. Reviewed with Dr. Curt Bears- he recommends warm compresses to the area on and off throughout the day until resolved.

## 2018-03-11 ENCOUNTER — Other Ambulatory Visit (HOSPITAL_COMMUNITY): Payer: Self-pay | Admitting: Adult Health

## 2018-03-11 ENCOUNTER — Ambulatory Visit (HOSPITAL_COMMUNITY)
Admit: 2018-03-11 | Discharge: 2018-03-11 | Disposition: A | Discharge status: Home / Self Care | Payer: Medicare Other | Attending: Internal Medicine | Admitting: Internal Medicine

## 2018-03-11 VITALS — BP 150/102 | HR 85 | Wt 129.0 lb

## 2018-03-11 DIAGNOSIS — Z79899 Other long term (current) drug therapy: Secondary | ICD-10-CM | POA: Insufficient documentation

## 2018-03-11 DIAGNOSIS — I480 Paroxysmal atrial fibrillation: Secondary | ICD-10-CM | POA: Insufficient documentation

## 2018-03-11 DIAGNOSIS — E1122 Type 2 diabetes mellitus with diabetic chronic kidney disease: Secondary | ICD-10-CM | POA: Insufficient documentation

## 2018-03-11 DIAGNOSIS — I130 Hypertensive heart and chronic kidney disease with heart failure and stage 1 through stage 4 chronic kidney disease, or unspecified chronic kidney disease: Secondary | ICD-10-CM | POA: Insufficient documentation

## 2018-03-11 DIAGNOSIS — N184 Chronic kidney disease, stage 4 (severe): Secondary | ICD-10-CM | POA: Insufficient documentation

## 2018-03-11 DIAGNOSIS — I5022 Chronic systolic (congestive) heart failure: Secondary | ICD-10-CM

## 2018-03-11 DIAGNOSIS — Z951 Presence of aortocoronary bypass graft: Secondary | ICD-10-CM | POA: Diagnosis not present

## 2018-03-11 DIAGNOSIS — E785 Hyperlipidemia, unspecified: Secondary | ICD-10-CM | POA: Diagnosis not present

## 2018-03-11 DIAGNOSIS — I5032 Chronic diastolic (congestive) heart failure: Secondary | ICD-10-CM

## 2018-03-11 DIAGNOSIS — Z9889 Other specified postprocedural states: Secondary | ICD-10-CM | POA: Diagnosis not present

## 2018-03-11 DIAGNOSIS — Z8249 Family history of ischemic heart disease and other diseases of the circulatory system: Secondary | ICD-10-CM | POA: Insufficient documentation

## 2018-03-11 DIAGNOSIS — I340 Nonrheumatic mitral (valve) insufficiency: Secondary | ICD-10-CM | POA: Diagnosis not present

## 2018-03-11 DIAGNOSIS — K219 Gastro-esophageal reflux disease without esophagitis: Secondary | ICD-10-CM | POA: Diagnosis not present

## 2018-03-11 DIAGNOSIS — I441 Atrioventricular block, second degree: Secondary | ICD-10-CM

## 2018-03-11 DIAGNOSIS — Z7982 Long term (current) use of aspirin: Secondary | ICD-10-CM | POA: Insufficient documentation

## 2018-03-11 DIAGNOSIS — Z888 Allergy status to other drugs, medicaments and biological substances status: Secondary | ICD-10-CM | POA: Diagnosis not present

## 2018-03-11 DIAGNOSIS — Z886 Allergy status to analgesic agent status: Secondary | ICD-10-CM | POA: Insufficient documentation

## 2018-03-11 DIAGNOSIS — F419 Anxiety disorder, unspecified: Secondary | ICD-10-CM | POA: Diagnosis not present

## 2018-03-11 DIAGNOSIS — I2510 Atherosclerotic heart disease of native coronary artery without angina pectoris: Secondary | ICD-10-CM | POA: Diagnosis not present

## 2018-03-11 DIAGNOSIS — F329 Major depressive disorder, single episode, unspecified: Secondary | ICD-10-CM | POA: Diagnosis not present

## 2018-03-11 DIAGNOSIS — Z87891 Personal history of nicotine dependence: Secondary | ICD-10-CM | POA: Diagnosis not present

## 2018-03-11 DIAGNOSIS — Z79890 Hormone replacement therapy (postmenopausal): Secondary | ICD-10-CM | POA: Insufficient documentation

## 2018-03-11 DIAGNOSIS — I252 Old myocardial infarction: Secondary | ICD-10-CM | POA: Diagnosis not present

## 2018-03-11 DIAGNOSIS — Z79891 Long term (current) use of opiate analgesic: Secondary | ICD-10-CM | POA: Insufficient documentation

## 2018-03-11 DIAGNOSIS — Z950 Presence of cardiac pacemaker: Secondary | ICD-10-CM

## 2018-03-11 DIAGNOSIS — Z8673 Personal history of transient ischemic attack (TIA), and cerebral infarction without residual deficits: Secondary | ICD-10-CM | POA: Diagnosis not present

## 2018-03-11 DIAGNOSIS — Z7901 Long term (current) use of anticoagulants: Secondary | ICD-10-CM | POA: Insufficient documentation

## 2018-03-11 DIAGNOSIS — N185 Chronic kidney disease, stage 5: Secondary | ICD-10-CM

## 2018-03-11 LAB — BASIC METABOLIC PANEL
Anion gap: 11 (ref 5–15)
BUN: 47 mg/dL — ABNORMAL HIGH (ref 8–23)
CO2: 21 mmol/L — ABNORMAL LOW (ref 22–32)
Calcium: 9.3 mg/dL (ref 8.9–10.3)
Chloride: 104 mmol/L (ref 98–111)
Creatinine, Ser: 3.94 mg/dL — ABNORMAL HIGH (ref 0.44–1.00)
GFR calc Af Amer: 13 mL/min — ABNORMAL LOW (ref >60)
GFR calc non Af Amer: 11 mL/min — ABNORMAL LOW (ref >60)
Glucose, Bld: 150 mg/dL — ABNORMAL HIGH (ref 70–99)
Potassium: 5.1 mmol/L (ref 3.5–5.1)
Sodium: 136 mmol/L (ref 135–145)

## 2018-03-11 LAB — BRAIN NATRIURETIC PEPTIDE: B Natriuretic Peptide: 1452.6 pg/mL — ABNORMAL HIGH (ref 0.0–100.0)

## 2018-03-11 LAB — CBC
HCT: 25.9 % — ABNORMAL LOW (ref 36.0–46.0)
Hemoglobin: 8.3 g/dL — ABNORMAL LOW (ref 12.0–15.0)
MCH: 26.8 pg (ref 26.0–34.0)
MCHC: 32 g/dL (ref 30.0–36.0)
MCV: 83.5 fL (ref 78.0–100.0)
Platelets: 396 10*3/uL (ref 150–400)
RBC: 3.1 MIL/uL — ABNORMAL LOW (ref 3.87–5.11)
RDW: 14.7 % (ref 11.5–15.5)
WBC: 7 10*3/uL (ref 4.0–10.5)

## 2018-03-11 MED ORDER — TORSEMIDE 100 MG PO TABS: 100.0000 mg | ORAL_TABLET | Freq: Every day | ORAL | 1 refills | Status: DC

## 2018-03-11 MED ORDER — DOXAZOSIN MESYLATE 8 MG PO TABS: ORAL_TABLET | ORAL | 11 refills | Status: DC

## 2018-03-11 NOTE — Progress Notes (Signed)
Advanced Heart Failure Clinic Note   PCP: Dr. Dorita Fray ([place])  HF Cardiology: Dr. Aundra Dubin  HPI: Jocelyn Hill is a 67 y.o. female with h/o mitral regurgitation, chronic diastolic CHF, CAD s/p MI, long standing HTN, DM2, CKD IV-V, h/o Stroke. Anxiety, depression and GERD. Patient reported that she has had 2 MIs treated at [facility] in [place] several years ago.   Presented to [facility] on 03/12/2017 with acute hypoxic respiratory failure. ECHO showed normal LV function with severe MR. Diuresed with IV lasix over 20 pounds. She was being considered for MVR by Dr Roxy Manns but due to multiple complications she was not a candidate. Hospital course was complicated by acute respiratory failure, acute renal failure, atrial fibrillation, retroperitoneal bleed, r hydronephrosis (perc tube) and E coli bacteremia. She was started on anticoagulation for atrial fibrillation but this was stopped with RP hematoma.  She converted to NSR on amiodarone but developed junctional rhythm so amiodarone was stopped. Creatinine peaked at 5.  She did not require dialysis and gradually renal function improved. Discharged to SNF without diuretics. Discharge weight was 124 pounds.   Admitted again 04/12/17-04/23/17 with respiratory failure from [facility] She had gained 16 pounds at SNF as she was not on diuretic.  CXR with bilateral infiltrates. Placed on vanc and zosyn. Pertinent admission labs included BNP > 3000, creatinine 3.75, K 4.1, WBC 16.1, procalcitonin 2.06, and troponin 0.04. Requiring 100% high flow oxygen. Blood CX - NGTD. Diuresing with high dose IV lasix.  Nephrology consulted, renal US with no evidence of hydronephrosis (pt had percutaneous nephrostomy drain the month prior due to this). Also placed on NTG drip for HTN and CP. She was seen by Dr. Roxy Manns again and felt still to not be a MVR candidate. Discharged on Lasix 160 mg BID. Discharge weight was 114 pounds.   Admitted 02/09/18  through 03/05/2018 for scheduled CABG and MVR with bioprosthetic valve. Hospital course complicated by renal failure and heart block. Underwent CRT-P on 02/26/2018 by Dr Curt Bears. RUE had IV infiltrate with erythema and numbness in her r  fingers. Discharge to [facility]. Discharge weight 125 pounds.   Today she returns for post hospital follow up. Overall feeling fair. Today she is complaining of pain around ICD and swelling. Also complaining of ongoing numbness in r hand. SOB with exertion.  Denies PND/Orthopnea. Appetite fair. No fever or chills. Not weighing daily. No bleeding issues. Meds provided at SNF.   TTE 03/12/17 LVEF 60-65%, Grade 2 DD, Severe MR, Severe LAE, Mild RAE, Mild/Mod TR, PA peak pressure 60 mm hg, small/mod pericardial effusion.  TEE 03/17/17 LVEF 55-60%, Trivial TR, Severe MR, mild LAE, mild mod TR. No evidence of vegetation.  LHC/RHC in 1/19 showed 90% ostial LCx, 60% mid LAD.   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   ECHO 02/22/2018 with EF 45-50% There has been interval placment of a bioprosthetic MV with normal gradients. There is mobile material below the valve which may be flail cord. PFO closure is noted without residual color doppler flow. A trivial persistent posterior pericardial effusion is noted.  Labs (10/18): K 3.8, creatinine 2.88 Labs (12/18): LDL 64, HDL 66 Labs (3/19): hgb 11.4 Labs (4/19): K 3.9, creatinine 3.97 Labs (12/21/2017): K 4.2 Creatinine 3.89  Labs (03/05/2018): Creatinine 4.33   Review of Systems: All systems reviewed and negative except as per HPI.   PMH: 1. Chronic  diastolic CHF:  - TTE 3/41/93 LVEF 60-65%, Grade 2 DD, Severe MR, Severe LAE, Mild RAE, Mild/Mod TR, PA peak pressure 60 mm hg, small/mod pericardial effusion. - RHC (1/19): mean RA 2, PA 37/12 mean 26, mean PCWP 13, CI 2.98 2. CKD: stage IV.  3. Anxiety/panic attacks 4.  DM 5. HTN 6. CAD: Patient reports prior MI treated at [facility].  No records available - LHC in 1/19 showed 90% ostial LCx, 60% mid LAD.  7. Retroperitoneal hematoma: With anticoagulation in 9/18.  8. Hydronephrosis due to RP hematoma: Resolved.  9. GERD 10. Hyperlipidemia 11. H/o CVA 12. 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  - TEE 03/17/17 LVEF 55-60%, Trivial TR, Severe MR, mild LAE, mild mod TR. No evidence of vegetation. 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  13. Atrial fibrillation: Paroxysmal.  Noted in 9/18, not anticoagulated currently due to RP hematoma.  - Junctional rhythm on amiodarone, amiodarone stopped. 14. S/P CRT-P for HB 02/26/2018 15. S/P CABG x2    Current Outpatient Medications  Medication Sig Dispense Refill  . acetaminophen (TYLENOL) 325 MG tablet Take 2 tablets (650 mg total) by mouth every 6 (six) hours as needed for mild pain, fever or headache. 30 tablet 0  . albuterol (PROVENTIL HFA;VENTOLIN HFA) 108 (90 Base) MCG/ACT inhaler Inhale 2 puffs into the lungs every 6 (six) hours as needed for wheezing or shortness of breath.    . ALPRAZolam (XANAX) 0.25 MG tablet Take 1 tablet (0.25 mg total) by mouth at bedtime as needed for anxiety. 20 tablet 0  . amLODipine (NORVASC) 10 MG tablet Take 1 tablet (10 mg total) by mouth daily. 30 tablet 0  . aspirin EC 81 MG EC tablet Take 1 tablet (81 mg total) by mouth daily. 30 tablet 0  . atorvastatin (LIPITOR) 80 MG tablet Take 1 tablet (80 mg total) by mouth daily. 30 tablet 3  . b complex vitamins tablet Take 1 tablet by mouth daily.    . bisacodyl (DULCOLAX) 5 MG EC tablet Take 5 mg by mouth daily as needed for mild constipation or moderate constipation.    Marland Kitchen buPROPion (WELLBUTRIN) 100 MG tablet Take 100 mg by mouth 3 (three) times daily.    . busPIRone (BUSPAR) 15  MG tablet Take 1 tablet (15 mg total) by mouth 2 (two) times daily. 60 tablet 0  . calcium carbonate (TUMS EX) 750 MG chewable tablet Chew 2 tablets by mouth as needed for heartburn.    . carvedilol (COREG) 6.25 MG tablet Take 1 tablet (6.25 mg total) by mouth 2 (two) times daily with a meal. 60 tablet 3  . cholecalciferol (VITAMIN D) 1000 units tablet Take 1,000 Units by mouth daily.    . diphenhydrAMINE (BENADRYL) 25 MG tablet Take 25 mg by mouth as needed for allergies.    Marland Kitchen doxazosin (CARDURA) 8 MG tablet Take 1 tablet (8 mg total) by mouth daily.    . DULoxetine (CYMBALTA) 30 MG capsule Take 1 capsule (30 mg total) by mouth daily. 30 capsule 0  . gabapentin (NEURONTIN) 300 MG capsule Take 300 mg by mouth at bedtime.  11  . hydrALAZINE (APRESOLINE) 100 MG tablet Take 1 tablet (100 mg total) by mouth every 8 (eight) hours. 90 tablet 3  . isosorbide mononitrate (IMDUR) 120 MG 24 hr tablet Take 1 tablet (120 mg total) by mouth  daily. 30 tablet 0  . levothyroxine (SYNTHROID, LEVOTHROID) 112 MCG tablet Take 1 tablet (112 mcg total) by mouth daily before breakfast. 30 tablet 0  . Liniments (BEN GAY EX) Apply 1 application topically as needed (back pain).    . montelukast (SINGULAIR) 10 MG tablet Take 1 tablet (10 mg total) by mouth daily. (Patient taking differently: Take 10 mg by mouth at bedtime. ) 30 tablet 0  . Nutritional Supplements (CARNATION BREAKFAST ESSENTIALS PO) Take 237 mLs by mouth 2 (two) times daily.    . ondansetron (ZOFRAN) 4 MG tablet Take 1 tablet (4 mg total) by mouth every 8 (eight) hours as needed for nausea or vomiting. 30 tablet 0  . oxyCODONE (OXY IR/ROXICODONE) 5 MG immediate release tablet Take 1 tablet (5 mg total) by mouth every 4 (four) hours as needed for severe pain. 30 tablet 0  . pantoprazole (PROTONIX) 40 MG tablet Take 1 tablet (40 mg total) by mouth 2 (two) times daily before a meal. (Patient taking differently: Take 40 mg by mouth daily before breakfast. ) 60  tablet 0  . potassium chloride SA (K-DUR,KLOR-CON) 20 MEQ tablet Take 2 tablets (40 mEq total) by mouth daily. 60 tablet 3  . sodium-potassium bicarbonate (ALKA-SELTZER GOLD) TBEF dissolvable tablet Take 4 tablets by mouth daily as needed (heartburn).    . torsemide (DEMADEX) 20 MG tablet Take 4 tablets (80 mg total) by mouth daily. 120 tablet 3  . warfarin (COUMADIN) 2.5 MG tablet Take 1 tablet (2.5 mg total) by mouth daily at 6 PM. 30 tablet 3   No current facility-administered medications for this encounter.     Allergies  Allergen Reactions  . Ace Inhibitors Anaphylaxis and Swelling  . Motrin Ib [Ibuprofen] Anaphylaxis      Social History   Socioeconomic History  . Marital status: Divorced    Spouse name: Not on file  . Number of children: Not on file  . Years of education: Not on file  . Highest education level: Not on file  Occupational History  . Not on file  Social Needs  . Financial resource strain: Not on file  . Food insecurity:    Worry: Not on file    Inability: Not on file  . Transportation needs:    Medical: Not on file    Non-medical: Not on file  Tobacco Use  . Smoking status: Former Smoker    Types: Cigarettes  . Smokeless tobacco: Never Used  Substance and Sexual Activity  . Alcohol use: No  . Drug use: No    Comment: marijuana in the past  . Sexual activity: Not Currently  Lifestyle  . Physical activity:    Days per week: Not on file    Minutes per session: Not on file  . Stress: Not on file  Relationships  . Social connections:    Talks on phone: Not on file    Gets together: Not on file    Attends religious service: Not on file    Active member of club or organization: Not on file    Attends meetings of clubs or organizations: Not on file    Relationship status: Not on file  . Intimate partner violence:    Fear of current or ex partner: Not on file    Emotionally abused: Not on file    Physically abused: Not on file    Forced sexual  activity: Not on file  Other Topics Concern  . Not on file  Social History  Narrative  . Not on file      Family History  Problem Relation Age of Onset  . Hypertension Mother      Vitals:   03/11/18 1428  BP: (!) 150/102  Pulse: 85  SpO2: 94%  Weight: 58.5 kg (129 lb)   Wt Readings from Last 3 Encounters:  03/11/18 58.5 kg (129 lb)  03/05/18 57.1 kg (125 lb 14.1 oz)  02/09/18 57.1 kg (125 lb 12.8 oz)   PHYSICAL EXAM: General:  Appears chronically ill.  No resp difficulty. Arrived in a wheel chair.  HEENT: Poor dentition Neck: supple. JVP 9-10 . Carotids 2+ bilat; no bruits. No lymphadenopathy or thryomegaly appreciated. Cor: PMI nondisplaced. Regular rate & rhythm. No rubs, gallops or murmurs. Sternal incision approximated. L upper chest CRT-P with ecchymosis and fullness.  Lungs: Crackles 1/2 up.  Abdomen: soft, nontender, nondistended. No hepatosplenomegaly. No bruits or masses. Good bowel sounds. Extremities: no cyanosis, clubbing, rash, R and LLE trace edema. RUE nonblanching area. No erythema.  Neuro: alert & orientedx3, cranial nerves grossly intact. moves all 4 extremities w/o difficulty. Affect pleasant    ASSESSMENT & PLAN: 1. Chronic diastolic CHF: ECHO 12/960 EF 60-65% with severe MR.  NYHA III. Volume status elevated. Increase torsemide to 100 mg daily.  -Check BMET and BNP.   2. CKD Stage IV:  Had R hydronephrosis in setting of hydronephrosis and required percutaneous drain in 9/18 (removed). Renal US 04/14/17 showed no further evidence of hydronephrosis.  -On 9/6 creatinine 4.33   -She has had vein mapping. No plan for HD at this time.  - She has follow up with Dr Hollie Salk.  3. HTN:   -Elevated.  - Continue hydralazine 100 mg three times a day  - Continue amlodipine 10 mg daily.  - Continue coreg 6.25 mg twice a day.   - Continue doxazosin to 8 mg daily and add 4 mg doxazosin at bedside.  4.  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  Confirmed on  4/19 TEE.   S/PBioprothetic MVR/MAZE on 12/19/2017 . 5. Atrial fibrillation: Paroxysmal.   - Regular pulse.  -She had a spontaneous RP hemorrhage in 9/18 but is now back on Eliquis.   - On coumadin. - She is off amiodarone due to junctional bradycardia in the hospital.  6. CAD:  LHC in 1/19 showed severe ostial LCx disease and moderate LAD disease. S/P CABG x2 on 12/19/2017  - No s/s ischemia.  - Continue ASA 81 and atorvastatin 40 mg daily.  - She is on Imdur 120 mg daily and Coreg 6.25 mg twice a day.  7. S/P CRT-P 02/26/2018 for heart block.  Fullness and tenderness noted in the upper chest around pocket. I personally called Chanetta Marshall. She recommended follow up with Dr Curt Bears and CBC.  8. RUE infiltrate Infiltrate noted in the hospital. She has some superficial skin loss.   Check CBC, BMET, and BNP today.   Follow up next week.    Darrick Grinder, NP 03/11/18

## 2018-03-11 NOTE — Patient Instructions (Signed)
INCREASE Torsemide to 100 mg once daily.  INCREASE Cardura to 8 mg (1 tab) in am and 4 mg (1/2 tab) in pm.  Routine lab work today. Will notify you of abnormal results, otherwise no news is good news!  See Dr. Curt Bears Monday at 4 pm to follow up with ICD. Address: [address] ([place]), [place], [place] [address]  Phone: [phone]  Follow up with our clinic next week.  _________________________________________________________________________ Jocelyn Hill Code: 3875  Take all medication as prescribed the day of your appointment. Bring all medications with you to your appointment.  Do the following things EVERYDAY: 1) Weigh yourself in the morning before breakfast. Write it down and keep it in a log. 2) Take your medicines as prescribed 3) Eat low salt foods-Limit salt (sodium) to 2000 mg per day.  4) Stay as active as you can everyday 5) Limit all fluids for the day to less than 2 liters

## 2018-03-12 ENCOUNTER — Telehealth: Payer: Self-pay

## 2018-03-12 NOTE — Telephone Encounter (Signed)
Spoke with Marissa who stated that pt is in a SNF and that the wound had been evaluated by the unit nurse manger there and that no further intervention for today was needed. Informed Marissa that pt could take Tylenol as needed for pain. She voiced understanding

## 2018-03-12 NOTE — Telephone Encounter (Signed)
Pt is feeling a lot of pain at the wound site. It is swollen. The best number to contact the patient daughter March Rummage is [phone]

## 2018-03-15 ENCOUNTER — Encounter (HOSPITAL_COMMUNITY): Payer: Self-pay

## 2018-03-15 ENCOUNTER — Encounter: Payer: Self-pay | Admitting: Cardiology

## 2018-03-15 ENCOUNTER — Ambulatory Visit (INDEPENDENT_AMBULATORY_CARE_PROVIDER_SITE_OTHER): Payer: Medicare Other | Admitting: Cardiology

## 2018-03-15 ENCOUNTER — Ambulatory Visit (HOSPITAL_COMMUNITY)
Admission: RE | Admit: 2018-03-15 | Discharge: 2018-03-15 | Disposition: A | Discharge status: Home / Self Care | Payer: Medicare Other | Source: Ambulatory Visit | Attending: Cardiology | Admitting: Cardiology

## 2018-03-15 ENCOUNTER — Other Ambulatory Visit: Payer: Self-pay | Admitting: Thoracic Surgery (Cardiothoracic Vascular Surgery)

## 2018-03-15 VITALS — BP 126/74 | HR 77 | Wt 133.0 lb

## 2018-03-15 VITALS — BP 126/78 | HR 80 | Ht 69.0 in | Wt 133.0 lb

## 2018-03-15 DIAGNOSIS — Z87891 Personal history of nicotine dependence: Secondary | ICD-10-CM | POA: Diagnosis not present

## 2018-03-15 DIAGNOSIS — Z8673 Personal history of transient ischemic attack (TIA), and cerebral infarction without residual deficits: Secondary | ICD-10-CM | POA: Insufficient documentation

## 2018-03-15 DIAGNOSIS — I5032 Chronic diastolic (congestive) heart failure: Secondary | ICD-10-CM | POA: Diagnosis present

## 2018-03-15 DIAGNOSIS — F329 Major depressive disorder, single episode, unspecified: Secondary | ICD-10-CM | POA: Insufficient documentation

## 2018-03-15 DIAGNOSIS — E1122 Type 2 diabetes mellitus with diabetic chronic kidney disease: Secondary | ICD-10-CM | POA: Diagnosis not present

## 2018-03-15 DIAGNOSIS — I051 Rheumatic mitral insufficiency: Secondary | ICD-10-CM

## 2018-03-15 DIAGNOSIS — R918 Other nonspecific abnormal finding of lung field: Secondary | ICD-10-CM | POA: Insufficient documentation

## 2018-03-15 DIAGNOSIS — I340 Nonrheumatic mitral (valve) insufficiency: Secondary | ICD-10-CM | POA: Insufficient documentation

## 2018-03-15 DIAGNOSIS — I130 Hypertensive heart and chronic kidney disease with heart failure and stage 1 through stage 4 chronic kidney disease, or unspecified chronic kidney disease: Secondary | ICD-10-CM | POA: Diagnosis not present

## 2018-03-15 DIAGNOSIS — N184 Chronic kidney disease, stage 4 (severe): Secondary | ICD-10-CM

## 2018-03-15 DIAGNOSIS — T801XXD Vascular complications following infusion, transfusion and therapeutic injection, subsequent encounter: Secondary | ICD-10-CM

## 2018-03-15 DIAGNOSIS — I5022 Chronic systolic (congestive) heart failure: Secondary | ICD-10-CM

## 2018-03-15 DIAGNOSIS — F419 Anxiety disorder, unspecified: Secondary | ICD-10-CM | POA: Insufficient documentation

## 2018-03-15 DIAGNOSIS — I2510 Atherosclerotic heart disease of native coronary artery without angina pectoris: Secondary | ICD-10-CM | POA: Diagnosis not present

## 2018-03-15 DIAGNOSIS — Z953 Presence of xenogenic heart valve: Secondary | ICD-10-CM

## 2018-03-15 DIAGNOSIS — Z7982 Long term (current) use of aspirin: Secondary | ICD-10-CM | POA: Insufficient documentation

## 2018-03-15 DIAGNOSIS — Z950 Presence of cardiac pacemaker: Secondary | ICD-10-CM

## 2018-03-15 DIAGNOSIS — K219 Gastro-esophageal reflux disease without esophagitis: Secondary | ICD-10-CM | POA: Insufficient documentation

## 2018-03-15 DIAGNOSIS — Z951 Presence of aortocoronary bypass graft: Secondary | ICD-10-CM | POA: Insufficient documentation

## 2018-03-15 DIAGNOSIS — I132 Hypertensive heart and chronic kidney disease with heart failure and with stage 5 chronic kidney disease, or end stage renal disease: Secondary | ICD-10-CM | POA: Insufficient documentation

## 2018-03-15 DIAGNOSIS — I480 Paroxysmal atrial fibrillation: Secondary | ICD-10-CM | POA: Diagnosis not present

## 2018-03-15 DIAGNOSIS — Z79899 Other long term (current) drug therapy: Secondary | ICD-10-CM | POA: Diagnosis not present

## 2018-03-15 DIAGNOSIS — R001 Bradycardia, unspecified: Secondary | ICD-10-CM | POA: Insufficient documentation

## 2018-03-15 DIAGNOSIS — I442 Atrioventricular block, complete: Secondary | ICD-10-CM

## 2018-03-15 DIAGNOSIS — I252 Old myocardial infarction: Secondary | ICD-10-CM | POA: Insufficient documentation

## 2018-03-15 DIAGNOSIS — N185 Chronic kidney disease, stage 5: Secondary | ICD-10-CM | POA: Insufficient documentation

## 2018-03-15 DIAGNOSIS — Z7901 Long term (current) use of anticoagulants: Secondary | ICD-10-CM | POA: Insufficient documentation

## 2018-03-15 DIAGNOSIS — Z886 Allergy status to analgesic agent status: Secondary | ICD-10-CM | POA: Diagnosis not present

## 2018-03-15 DIAGNOSIS — I25810 Atherosclerosis of coronary artery bypass graft(s) without angina pectoris: Secondary | ICD-10-CM

## 2018-03-15 DIAGNOSIS — E785 Hyperlipidemia, unspecified: Secondary | ICD-10-CM | POA: Diagnosis not present

## 2018-03-15 DIAGNOSIS — R7881 Bacteremia: Secondary | ICD-10-CM | POA: Diagnosis not present

## 2018-03-15 DIAGNOSIS — N1330 Unspecified hydronephrosis: Secondary | ICD-10-CM | POA: Diagnosis not present

## 2018-03-15 LAB — BASIC METABOLIC PANEL
Anion gap: 11 (ref 5–15)
BUN: 48 mg/dL — ABNORMAL HIGH (ref 8–23)
CO2: 23 mmol/L (ref 22–32)
Calcium: 9.2 mg/dL (ref 8.9–10.3)
Chloride: 103 mmol/L (ref 98–111)
Creatinine, Ser: 4.29 mg/dL — ABNORMAL HIGH (ref 0.44–1.00)
GFR calc Af Amer: 11 mL/min — ABNORMAL LOW (ref >60)
GFR calc non Af Amer: 10 mL/min — ABNORMAL LOW (ref >60)
Glucose, Bld: 153 mg/dL — ABNORMAL HIGH (ref 70–99)
Potassium: 4.8 mmol/L (ref 3.5–5.1)
Sodium: 137 mmol/L (ref 135–145)

## 2018-03-15 MED ORDER — TORSEMIDE 20 MG PO TABS: ORAL_TABLET | ORAL | Status: DC

## 2018-03-15 NOTE — Progress Notes (Signed)
Advanced Heart Failure Clinic Note   PCP: Dr. Dorita Fray ([place])  HF Cardiology: Dr. Aundra Dubin  HPI: Jocelyn Hill. Jocelyn Hill is a 67 y.o. female with h/o mitral regurgitation, chronic diastolic CHF, CAD s/p MI, long standing HTN, DM2, CKD IV-V, h/o Stroke. Anxiety, depression and GERD. Patient reported that she has had 2 MIs treated at [facility] in [place] several years ago.   Presented to [facility] on 03/12/2017 with acute hypoxic respiratory failure. ECHO showed normal LV function with severe MR. Diuresed with IV lasix over 20 pounds. She was being considered for MVR by Dr Roxy Manns but due to multiple complications she was not a candidate. Hospital course was complicated by acute respiratory failure, acute renal failure, atrial fibrillation, retroperitoneal bleed, r hydronephrosis (perc tube) and E coli bacteremia. She was started on anticoagulation for atrial fibrillation but this was stopped with RP hematoma.  She converted to NSR on amiodarone but developed junctional rhythm so amiodarone was stopped. Creatinine peaked at 5.  She did not require dialysis and gradually renal function improved. Discharged to SNF without diuretics. Discharge weight was 124 pounds.   Admitted again 04/12/17-04/23/17 with respiratory failure from [facility]. She had gained 16 pounds at SNF as she was not on diuretic.  CXR with bilateral infiltrates. Placed on vanc and zosyn. Pertinent admission labs included BNP > 3000, creatinine 3.75, K 4.1, WBC 16.1, procalcitonin 2.06, and troponin 0.04. Requiring 100% high flow oxygen. Blood CX - NGTD. Diuresing with high dose IV lasix.  Nephrology consulted, renal US with no evidence of hydronephrosis (pt had percutaneous nephrostomy drain the month prior due to this). Also placed on NTG drip for HTN and CP. She was seen by Dr. Roxy Manns again and felt still to not be a MVR candidate. Discharged on Lasix 160 mg BID. Discharge weight was 114 pounds.   Admitted 02/09/18  through 03/05/2018 for scheduled CABG and MVR with bioprosthetic valve. Hospital course complicated by renal failure and heart block. Underwent CRT-P on 02/26/2018 by Dr Curt Bears. RUE had IV infiltrate with erythema and numbness in her r  fingers. Discharge to [facility]. Discharge weight 125 pounds.   She presents today for 1 week follow up. Feels about the same as last week. SOB with exertion and mild orthopnea. Feels like UOP has increased on higher dose of torsemide, but remains swollen. Meds provided by SNF. Starting to work with PT. Has ongoing soreness in her L shoulder and numbness in her R hand. Denies fevers or chills. No bleeding issues.  She sees EP this afternoon, TCTS tomorrow, and Renal Wednesday.   TTE 03/12/17 LVEF 60-65%, Grade 2 DD, Severe MR, Severe LAE, Mild RAE, Mild/Mod TR, PA peak pressure 60 mm hg, small/mod pericardial effusion.  TEE 03/17/17 LVEF 55-60%, Trivial TR, Severe MR, mild LAE, mild mod TR. No evidence of vegetation.  LHC/RHC in 1/19 showed 90% ostial LCx, 60% mid LAD.   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   ECHO 02/22/2018 with EF 45-50% There has been interval placment of a bioprosthetic MV with normal gradients. There is mobile material below the valve which may be flail cord. PFO closure is noted without residual color doppler flow. A trivial persistent posterior pericardial effusion is noted.  Labs (10/18): K 3.8, creatinine 2.88 Labs (12/18): LDL 64, HDL 66 Labs (3/19): hgb 11.4 Labs (4/19): K 3.9, creatinine 3.97 Labs (12/21/2017): K 4.2 Creatinine 3.89  Labs (03/05/2018): Creatinine 4.33   Review of systems complete and found to be negative unless listed in HPI.    PMH: 1. Chronic diastolic CHF:  - TTE 2/54/27 LVEF 60-65%, Grade 2 DD, Severe MR, Severe LAE, Mild RAE, Mild/Mod TR, PA peak pressure 60 mm hg, small/mod pericardial effusion. - RHC (1/19):  mean RA 2, PA 37/12 mean 26, mean PCWP 13, CI 2.98 2. CKD: stage IV.  3. Anxiety/panic attacks 4. DM 5. HTN 6. CAD: Patient reports prior MI treated at [facility].  No records available - LHC in 1/19 showed 90% ostial LCx, 60% mid LAD.  7. Retroperitoneal hematoma: With anticoagulation in 9/18.  8. Hydronephrosis due to RP hematoma: Resolved.  9. GERD 10. Hyperlipidemia 11. H/o CVA 12. 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  - TEE 03/17/17 LVEF 55-60%, Trivial TR, Severe MR, mild LAE, mild mod TR. No evidence of vegetation. 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  13. Atrial fibrillation: Paroxysmal.  Noted in 9/18, not anticoagulated currently due to RP hematoma.  - Junctional rhythm on amiodarone, amiodarone stopped. 14. S/P CRT-P for HB 02/26/2018 15. S/P CABG x2    Current Outpatient Medications  Medication Sig Dispense Refill  . acetaminophen (TYLENOL) 325 MG tablet Take 2 tablets (650 mg total) by mouth every 6 (six) hours as needed for mild pain, fever or headache. 30 tablet 0  . albuterol (PROVENTIL HFA;VENTOLIN HFA) 108 (90 Base) MCG/ACT inhaler Inhale 2 puffs into the lungs every 6 (six) hours as needed for wheezing or shortness of breath.    . ALPRAZolam (XANAX) 0.25 MG tablet Take 1 tablet (0.25 mg total) by mouth at bedtime as needed for anxiety. 20 tablet 0  . amLODipine (NORVASC) 10 MG tablet Take 1 tablet (10 mg total) by mouth daily. 30 tablet 0  . aspirin EC 81 MG EC tablet Take 1 tablet (81 mg total) by mouth daily. 30 tablet 0  . atorvastatin (LIPITOR) 80 MG tablet Take 1 tablet (80 mg total) by mouth daily. 30 tablet 3  . b complex vitamins tablet Take 1 tablet by mouth daily.    . bisacodyl (DULCOLAX) 5 MG EC tablet Take 5 mg by mouth daily as needed for mild constipation or moderate constipation.    Marland Kitchen  buPROPion (WELLBUTRIN) 100 MG tablet Take 100 mg by mouth 3 (three) times daily.    . busPIRone (BUSPAR) 15 MG tablet Take 1 tablet (15 mg total) by mouth 2 (two) times daily. 60 tablet 0  . calcium carbonate (TUMS EX) 750 MG chewable tablet Chew 2 tablets by mouth as needed for heartburn.    . carvedilol (COREG) 6.25 MG tablet Take 1 tablet (6.25 mg total) by mouth 2 (two) times daily with a meal. 60 tablet 3  . cholecalciferol (VITAMIN D) 1000 units tablet Take 1,000 Units by mouth daily.    . diphenhydrAMINE (BENADRYL) 25 MG tablet Take 25 mg by mouth as needed for allergies.    Marland Kitchen doxazosin (CARDURA) 8 MG tablet Take 8 mg (1 tab) in am and 4 mg (1/2 tab) in pm 45 tablet 11  . DULoxetine (CYMBALTA) 30 MG capsule Take 1 capsule (30 mg total) by mouth daily. 30 capsule 0  . gabapentin (NEURONTIN) 300 MG capsule Take 300 mg by mouth at bedtime.  11  . hydrALAZINE (APRESOLINE) 100 MG tablet Take 1  tablet (100 mg total) by mouth every 8 (eight) hours. 90 tablet 3  . isosorbide mononitrate (IMDUR) 120 MG 24 hr tablet Take 1 tablet (120 mg total) by mouth daily. 30 tablet 0  . levothyroxine (SYNTHROID, LEVOTHROID) 112 MCG tablet Take 1 tablet (112 mcg total) by mouth daily before breakfast. 30 tablet 0  . Liniments (BEN GAY EX) Apply 1 application topically as needed (back pain).    . Menthol-Methyl Salicylate (BENGAY ARTHRITIS FORMULA EX) Apply topically.    . montelukast (SINGULAIR) 10 MG tablet Take 1 tablet (10 mg total) by mouth daily. (Patient taking differently: Take 10 mg by mouth at bedtime. ) 30 tablet 0  . Nutritional Supplements (CARNATION BREAKFAST ESSENTIALS PO) Take 237 mLs by mouth 2 (two) times daily.    . ondansetron (ZOFRAN) 4 MG tablet Take 1 tablet (4 mg total) by mouth every 8 (eight) hours as needed for nausea or vomiting. 30 tablet 0  . oxyCODONE (OXY IR/ROXICODONE) 5 MG immediate release tablet Take 1 tablet (5 mg total) by mouth every 4 (four) hours as needed for severe pain. 30  tablet 0  . pantoprazole (PROTONIX) 40 MG tablet Take 1 tablet (40 mg total) by mouth 2 (two) times daily before a meal. (Patient taking differently: Take 40 mg by mouth daily before breakfast. ) 60 tablet 0  . potassium chloride SA (K-DUR,KLOR-CON) 20 MEQ tablet Take 2 tablets (40 mEq total) by mouth daily. 60 tablet 3  . sodium-potassium bicarbonate (ALKA-SELTZER GOLD) TBEF dissolvable tablet Take 4 tablets by mouth daily as needed (heartburn).    . torsemide (DEMADEX) 100 MG tablet Take 1 tablet (100 mg total) by mouth daily. 30 tablet 1  . traZODone (DESYREL) 25 mg TABS tablet Take 25 mg by mouth at bedtime.    Marland Kitchen warfarin (COUMADIN) 2.5 MG tablet Take 1 tablet (2.5 mg total) by mouth daily at 6 PM. 30 tablet 3   No current facility-administered medications for this encounter.     Allergies  Allergen Reactions  . Ace Inhibitors Anaphylaxis and Swelling  . Motrin Ib [Ibuprofen] Anaphylaxis      Social History   Socioeconomic History  . Marital status: Divorced    Spouse name: Not on file  . Number of children: Not on file  . Years of education: Not on file  . Highest education level: Not on file  Occupational History  . Not on file  Social Needs  . Financial resource strain: Not on file  . Food insecurity:    Worry: Not on file    Inability: Not on file  . Transportation needs:    Medical: Not on file    Non-medical: Not on file  Tobacco Use  . Smoking status: Former Smoker    Types: Cigarettes  . Smokeless tobacco: Never Used  Substance and Sexual Activity  . Alcohol use: No  . Drug use: No    Comment: marijuana in the past  . Sexual activity: Not Currently  Lifestyle  . Physical activity:    Days per week: Not on file    Minutes per session: Not on file  . Stress: Not on file  Relationships  . Social connections:    Talks on phone: Not on file    Gets together: Not on file    Attends religious service: Not on file    Active member of club or organization:  Not on file    Attends meetings of clubs or organizations: Not on file  Relationship status: Not on file  . Intimate partner violence:    Fear of current or ex partner: Not on file    Emotionally abused: Not on file    Physically abused: Not on file    Forced sexual activity: Not on file  Other Topics Concern  . Not on file  Social History Narrative  . Not on file      Family History  Problem Relation Age of Onset  . Hypertension Mother    Vitals:   03/15/18 1502  BP: 126/74  Pulse: 77  SpO2: 94%  Weight: 60.3 kg (133 lb)   Wt Readings from Last 3 Encounters:  03/15/18 60.3 kg (133 lb)  03/11/18 58.5 kg (129 lb)  03/05/18 57.1 kg (125 lb 14.1 oz)   PHYSICAL EXAM: General: Chronically ill appearing.  HEENT: Normal Neck: Supple. JVP 10+ cm. Carotids 2+ bilat; no bruits. No thyromegaly or nodule noted. Cor: PMI nondisplaced. RRR, No M/G/R noted. Sternal incision C/D/I. L upper chest CRT-P looks OK.  Lungs: CTAB, normal effort. Abdomen: Soft, non-tender, non-distended, no HSM. No bruits or masses. +BS  Extremities: No cyanosis, clubbing, or rash. R and LLE no edema. RUE with skin breakdown from infiltrate.  Neuro: Alert & orientedx3, cranial nerves grossly intact. moves all 4 extremities w/o difficulty. Affect pleasant    ASSESSMENT & PLAN: 1. Chronic diastolic CHF: ECHO 07/6376 EF 60-65% with severe MR.  - NYHA III-IIIb currently - Volume status remains elevated on exam - Discussed with Dr. Aundra Dubin.  - Increase torsemide to 100 mg q am and 40 mg q pm. BMET today and 7 days (OK to draw repeat at facility).  2. CKD Stage IV:  -  Had R hydronephrosis in setting of hydronephrosis and required percutaneous drain in 9/18 (removed). Renal US 04/14/17 showed no further evidence of hydronephrosis.  - Creatinine 3.94 03/11/18.  - She has had vein mapping. No plan for HD at this time.  - She has follow up with Dr Hollie Salk. Seens Renal 03/17/18 3. HTN:   - Stable on current meds.    - Continue hydralazine 100 mg three times a day  - Continue amlodipine 10 mg daily.  - Continue coreg 6.25 mg twice a day.   - Continue doxazosin to 8 mg daily and 4 mg doxazosin qhs 4.  Mitral regurgitation:  Severe. - S/P Bioprothetic MVR/MAZE on 12/19/2017 . - Stable.  5. Atrial fibrillation: Paroxysmal.   - Regular on exam.  - She had a spontaneous RP hemorrhage in 9/18 but is now back on Eliquis.   - On coumadin. - She is off amiodarone due to junctional bradycardia in the hospital.  6. CAD:  LHC in 1/19 showed severe ostial LCx disease and moderate LAD disease. S/P CABG x2 on 12/19/2017  - No s/s of ischemia.    - Continue ASA 81 and atorvastatin 40 mg daily.  - She is on Imdur 120 mg daily and Coreg 6.25 mg twice a day.  7. S/P CRT-P 02/26/2018 for heart block.  - Fullness and tenderness noted in the upper chest around pocket.  - CBC stable 03/11/18. Sees EP this afternoon.  8. RUE infiltrate - Infiltrate noted in the hospital. She had some superficial skin loss.  - Improved.    Having a slow recovery post op. Adjust diuretics as above. RTC 2 weeks, sooner with symptoms. Labs today and 1 week.   Shirley Friar, PA-C 03/15/18   Greater than 50% of the 30 minute  visit was spent in counseling/coordination of care regarding disease state education, salt/fluid restriction, sliding scale diuretics, and medication compliance.

## 2018-03-15 NOTE — Patient Instructions (Signed)
INCREASE Torsemide to 100 mg in am and 40 mg in pm.  Routine lab work today. Will notify you of abnormal results, otherwise no news is good news!  Wound care as needed to right forearm for recent infiltrate of IV.  Repeat lab work to be completed in 1 week at facility.  Follow up as scheduled. See attached sheet for appointment details highlighted.  Take all medication as prescribed the day of your appointment. Bring all medications with you to your appointment.  Do the following things EVERYDAY: 1) Weigh yourself in the morning before breakfast. Write it down and keep it in a log. 2) Take your medicines as prescribed 3) Eat low salt foods-Limit salt (sodium) to 2000 mg per day.  4) Stay as active as you can everyday 5) Limit all fluids for the day to less than 2 liters

## 2018-03-15 NOTE — Progress Notes (Signed)
Electrophysiology Office Note   Date:  03/15/2018   ID:  Jocelyn Hill, DOB [birth year], MRN [id number]  PCP:  [name], MD  Cardiologist:  Aundra Dubin Primary Electrophysiologist:  Constance Haw, MD    No chief complaint on file.    History of Present Illness: Jocelyn Hill is a 67 y.o. female who is being seen today for the evaluation of complete AV block at the request of Antil, Venia Carbon, MD. Presenting today for electrophysiology evaluation.  She has a history of mitral regurgitation, chronic diastolic heart failure, CAD status post MI, long-standing hypertension, diabetes, CKD stage IV-V, and stroke.  She presented to [facility] 03/12/2017 with hypoxic respiratory failure.  Echo showed normal LV systolic function with severe MR.  She was diuresed with Lasix over 20 pounds.  She was thought not to be a candidate for MVR at the time.  Hospital course was complicated by respiratory failure, renal failure, atrial fibrillation, retroperitoneal bleed, and right hydronephrosis.  She also had E. coli bacteremia.  She was admitted 813/19 through 03/05/2018 for CABG and MVR with bioprosthetic valve.  She underwent CRT-P implant due to complete heart block on 02/26/2018.    Today, she denies symptoms of palpitations, chest pain, shortness of breath, orthopnea, PND, lower extremity edema, claudication, dizziness, presyncope, syncope, bleeding, or neurologic sequela. The patient is tolerating medications without difficulties.  Her main symptoms today are of left arm pain.  Is she has pain with movement of her shoulder and of her forearm at the elbow.  It is painful to for her to extend her elbow and with just about any shoulder movement.   Past Medical History:  Diagnosis Date  . Anemia    low iron  . Anxiety   . Arthritis   . Bronchitis   . CHF (congestive heart failure) ([place])   . CKD (chronic kidney disease) stage 4, GFR 15-29 ml/min (HCC) 03/16/2017  . Coronary artery disease    . Depression   . Diabetes mellitus without complication ([place])   . Diet-controlled diabetes mellitus ([place])   . Ganglion cyst    left wrist  . GERD (gastroesophageal reflux disease)   . Headache    chronic headaches  . Heart failure ([place])   . Hyperlipidemia   . Hypertension   . Hypothyroidism   . Mitral regurgitation   . Myocardial infarction ([place])    3x last one 2008  . PAF (paroxysmal atrial fibrillation) ([place])   . Pneumonia    x 2  . S/P Maze operation for atrial fibrillation 02/18/2018   Complete bilateral atrial lesion set using bipolar radiofrequency and cryothermy ablation with clipping of LA appendage  . S/P mitral valve replacement with bioprosthetic valve 02/18/2018   [facility] Mitral stented bovine pericardial tissue valve Model 7300 TFX Serial # [id number] Size 29  . Stroke [facility])    no lasting residual - ? 2014  . Umbilical hernia    Past Surgical History:  Procedure Laterality Date  . ABDOMINAL HYSTERECTOMY     PARTIALS  . BIV PACEMAKER INSERTION CRT-P N/A 02/26/2018   Procedure: BIV PACEMAKER INSERTION CRT-P;  Surgeon: Constance Haw, MD;  Location: [place] CV LAB;  Service: Cardiovascular;  Laterality: N/A;  . CARDIAC CATHETERIZATION    . CESAREAN SECTION    . CORONARY ARTERY BYPASS GRAFT N/A 02/18/2018   Procedure: CORONARY ARTERY BYPASS GRAFTING (CABG) WITH IMA. ENDOSCOPIC VEIN HARVEST. IMA TO LAD, SVG TO CIRC.;  Surgeon: [name]  H, MD;  Location: [place];  Service: Open Heart Surgery;  Laterality: N/A;  . INSERTION OF DIALYSIS CATHETER N/A 02/18/2018   Procedure: PLACEMENT OF TEMPORARY HD CATHETER, POWER TRIALYSIS 13FR 20CM;  Surgeon: Rexene Alberts, MD;  Location: [place];  Service: Vascular;  Laterality: N/A;  . IR NEPHRO TUBE REMOV/FL  04/02/2017  . IR NEPHROSTOGRAM RIGHT THRU EXISTING ACCESS  04/02/2017  . IR NEPHROSTOMY PLACEMENT RIGHT  03/27/2017  . IR THORACENTESIS ASP PLEURAL SPACE W/IMG GUIDE  03/24/2017  . MAZE N/A 02/18/2018   Procedure:  MAZE;  Surgeon: Rexene Alberts, MD;  Location: [place];  Service: Open Heart Surgery;  Laterality: N/A;  . MITRAL VALVE REPLACEMENT N/A 02/18/2018   Procedure: MITRAL VALVE (MV) REPLACEMENT;  Surgeon: Rexene Alberts, MD;  Location: [place];  Service: Open Heart Surgery;  Laterality: N/A;  . MULTIPLE EXTRACTIONS WITH ALVEOLOPLASTY N/A 11/05/2017   Procedure: Extraction of tooth #'s 4,5,7,8,9,10,12,14 and 29 with alveoloplasty and gross debridement of remaining teeth;  Surgeon: [name], DDS;  Location: [place];  Service: Oral Surgery;  Laterality: N/A;  . PATENT FORAMEN OVALE(PFO) CLOSURE N/A 02/18/2018   Procedure: PATENT FORAMEN OVALE (PFO) CLOSURE;  Surgeon: Rexene Alberts, MD;  Location: [place];  Service: Open Heart Surgery;  Laterality: N/A;  . RIGHT/LEFT HEART CATH AND CORONARY ANGIOGRAPHY N/A 07/03/2017   Procedure: RIGHT/LEFT HEART CATH AND CORONARY ANGIOGRAPHY;  Surgeon: Larey Dresser, MD;  Location: [place] CV LAB;  Service: Cardiovascular;  Laterality: N/A;  . TEE WITHOUT CARDIOVERSION N/A 10/08/2017   Procedure: TRANSESOPHAGEAL ECHOCARDIOGRAM (TEE);  Surgeon: Larey Dresser, MD;  Location: [facility] ENDOSCOPY;  Service: Cardiovascular;  Laterality: N/A;  . TEE WITHOUT CARDIOVERSION N/A 02/18/2018   Procedure: TRANSESOPHAGEAL ECHOCARDIOGRAM (TEE);  Surgeon: Rexene Alberts, MD;  Location: [place];  Service: Open Heart Surgery;  Laterality: N/A;  . TONSILLECTOMY    . TUBAL LIGATION       Current Outpatient Medications  Medication Sig Dispense Refill  . acetaminophen (TYLENOL) 325 MG tablet Take 2 tablets (650 mg total) by mouth every 6 (six) hours as needed for mild pain, fever or headache. 30 tablet 0  . albuterol (PROVENTIL HFA;VENTOLIN HFA) 108 (90 Base) MCG/ACT inhaler Inhale 2 puffs into the lungs every 6 (six) hours as needed for wheezing or shortness of breath.    . ALPRAZolam (XANAX) 0.25 MG tablet Take 1 tablet (0.25 mg total) by mouth at bedtime as needed for anxiety. 20  tablet 0  . amLODipine (NORVASC) 10 MG tablet Take 1 tablet (10 mg total) by mouth daily. 30 tablet 0  . aspirin EC 81 MG EC tablet Take 1 tablet (81 mg total) by mouth daily. 30 tablet 0  . atorvastatin (LIPITOR) 80 MG tablet Take 1 tablet (80 mg total) by mouth daily. 30 tablet 3  . b complex vitamins tablet Take 1 tablet by mouth daily.    . bisacodyl (DULCOLAX) 5 MG EC tablet Take 5 mg by mouth daily as needed for mild constipation or moderate constipation.    Marland Kitchen buPROPion (WELLBUTRIN) 100 MG tablet Take 100 mg by mouth 3 (three) times daily.    . busPIRone (BUSPAR) 15 MG tablet Take 1 tablet (15 mg total) by mouth 2 (two) times daily. 60 tablet 0  . calcium carbonate (TUMS EX) 750 MG chewable tablet Chew 2 tablets by mouth as needed for heartburn.    . carvedilol (COREG) 6.25 MG tablet Take 1 tablet (6.25 mg total)  by mouth 2 (two) times daily with a meal. 60 tablet 3  . cholecalciferol (VITAMIN D) 1000 units tablet Take 1,000 Units by mouth daily.    . diphenhydrAMINE (BENADRYL) 25 MG tablet Take 25 mg by mouth as needed for allergies.    Marland Kitchen doxazosin (CARDURA) 8 MG tablet Take 8 mg (1 tab) in am and 4 mg (1/2 tab) in pm 45 tablet 11  . DULoxetine (CYMBALTA) 30 MG capsule Take 1 capsule (30 mg total) by mouth daily. 30 capsule 0  . gabapentin (NEURONTIN) 300 MG capsule Take 300 mg by mouth at bedtime.  11  . hydrALAZINE (APRESOLINE) 100 MG tablet Take 1 tablet (100 mg total) by mouth every 8 (eight) hours. 90 tablet 3  . isosorbide mononitrate (IMDUR) 120 MG 24 hr tablet Take 1 tablet (120 mg total) by mouth daily. 30 tablet 0  . levothyroxine (SYNTHROID, LEVOTHROID) 150 MCG tablet Take 150 mcg by mouth daily before breakfast.    . Liniments (BEN GAY EX) Apply 1 application topically as needed (back pain).    . Menthol-Methyl Salicylate (BENGAY ARTHRITIS FORMULA EX) Apply topically.    . montelukast (SINGULAIR) 10 MG tablet Take 1 tablet (10 mg total) by mouth daily. (Patient taking  differently: Take 10 mg by mouth at bedtime. ) 30 tablet 0  . Nutritional Supplements (CARNATION BREAKFAST ESSENTIALS PO) Take 237 mLs by mouth 2 (two) times daily.    . ondansetron (ZOFRAN) 4 MG tablet Take 1 tablet (4 mg total) by mouth every 8 (eight) hours as needed for nausea or vomiting. 30 tablet 0  . oxyCODONE (OXY IR/ROXICODONE) 5 MG immediate release tablet Take 1 tablet (5 mg total) by mouth every 4 (four) hours as needed for severe pain. 30 tablet 0  . pantoprazole (PROTONIX) 40 MG tablet Take 1 tablet (40 mg total) by mouth 2 (two) times daily before a meal. 60 tablet 0  . potassium chloride SA (K-DUR,KLOR-CON) 20 MEQ tablet Take 2 tablets (40 mEq total) by mouth daily. 60 tablet 3  . sodium-potassium bicarbonate (ALKA-SELTZER GOLD) TBEF dissolvable tablet Take 4 tablets by mouth daily as needed (heartburn).    . torsemide (DEMADEX) 20 MG tablet Take 100 mg in am and 40 mg in pm    . traZODone (DESYREL) 25 mg TABS tablet Take 25 mg by mouth at bedtime.    Marland Kitchen warfarin (COUMADIN) 2.5 MG tablet Take 1 tablet (2.5 mg total) by mouth daily at 6 PM. 30 tablet 3   No current facility-administered medications for this visit.     Allergies:   Ace inhibitors and Motrin ib [ibuprofen]   Social History:  The patient  reports that she has quit smoking. Her smoking use included cigarettes. She has never used smokeless tobacco. She reports that she does not drink alcohol or use drugs.   Family History:  The patient's family history includes Hypertension in her mother.    ROS:  Please see the history of present illness.   Otherwise, review of systems is positive for weight change, chills, chest pain, swelling, shortness of breath, chest pressure, anxiety, depression, back pain, muscle pain, dizziness.   All other systems are reviewed and negative.    PHYSICAL EXAM: VS:  BP 126/78   Pulse 80   Ht 5\' 9"  (1.753 m)   Wt 133 lb (60.3 kg)   SpO2 96%   BMI 19.64 kg/m  , BMI Body mass index is  19.64 kg/m. GEN: Well nourished, well developed, in no  acute distress  HEENT: normal  Neck: no JVD, carotid bruits, or masses Cardiac: RRR; no murmurs, rubs, or gallops,no edema  Respiratory:  clear to auscultation bilaterally, normal work of breathing GI: soft, nontender, nondistended, + BS MS: no deformity or atrophy.  Pain with extension of the forearm in the biceps. Skin: warm and dry,  device pocket is well healed no hematoma over device pocket Neuro:  Strength and sensation are intact Psych: euthymic mood, full affect  EKG:  EKG is not ordered today. Personal review of the ekg ordered 02/27/18 shows atrial sensed, ventricular paced, rate 97   Device interrogation is reviewed today in detail.  See PaceArt for details.   Recent Labs: 10/26/2017: TSH 2.884 02/19/2018: Magnesium 2.8 02/25/2018: ALT 31 03/11/2018: B Natriuretic Peptide 1,452.6; Hemoglobin 8.3; Platelets 396 03/15/2018: BUN 48; Creatinine, Ser 4.29; Potassium 4.8; Sodium 137    Lipid Panel     Component Value Date/Time   CHOL 159 06/18/2017 1301   TRIG 146 06/18/2017 1301   HDL 66 06/18/2017 1301   CHOLHDL 2.4 06/18/2017 1301   VLDL 29 06/18/2017 1301   LDLCALC 64 06/18/2017 1301     Wt Readings from Last 3 Encounters:  03/15/18 133 lb (60.3 kg)  03/15/18 133 lb (60.3 kg)  03/11/18 129 lb (58.5 kg)      Other studies Reviewed: Additional studies/ records that were reviewed today include: TTE 02/22/18  Review of the above records today demonstrates:  - Left ventricle: The cavity size was normal. Wall thickness was   increased in a pattern of mild LVH. Global hypokinesis with   incoordinate septal motion. Systolic function was mildly reduced.   The estimated ejection fraction was in the range of 45% to 50%.   The study is not technically sufficient to allow evaluation of LV   diastolic function. - Mitral valve: Bioprosthetic MVR. No obstruction. Mobile   subvalvular density - may be ruptured cord or  less likely   thrombus. Mean gradient (D): 3 mm Hg. Valve area by continuity   equation (using LVOT flow): 1.87 cm^2. - Left atrium: Severely dilated. - Right ventricle: The cavity size was mildly dilated. Mildly   reduced systolic function. - Right atrium: Moderately dilated. - Atrial septum: No defect or patent foramen ovale was identified.   s/p PFO closure. - Tricuspid valve: There was moderate regurgitation. - Pulmonary arteries: PA peak pressure: 32 mm Hg (S). - Pericardium, extracardiac: A trivial pericardial effusion was   identified posterior to the heart. Features were not consistent   with tamponade physiology. There was a left pleural effusion.   ASSESSMENT AND PLAN:  1.  Complete heart block: Status post Saint Jude CRT-P implanted 02/26/2018.  Device functioning appropriately.  No changes.  2.  Chronic diastolic heart failure: Echo with EF of 45 to 50%.  Volume status per heart failure.  3.  Mitral regurgitation, severe: Status post bioprosthetic MVR/maze 12/19/2017.  4.  Paroxysmal atrial fibrillation: Currently on Eliquis.  Status post maze.  This patients CHA2DS2-VASc Score and unadjusted Ischemic Stroke Rate (% per year) is equal to 4.8 % stroke rate/year from a score of 4  Above score calculated as 1 point each if present [CHF, HTN, DM, Vascular=MI/PAD/Aortic Plaque, Age if 65-74, or Female] Above score calculated as 2 points each if present [Age > 75, or Stroke/TIA/TE]  5.  Coronary artery disease: Status post two-vessel CABG.  No current chest pain.  6.  Left arm pain: At this point I do not  feel like her left arm pain is due to her device implant.  On manipulation of her forearm, she had biceps pain with extension at the elbow.  It is likely that this is due to stiffening of the muscle.  Would recommend further exercises to strengthen her biceps muscles and improve range of motion in her elbow.  She can also use her shoulder muscles with exercises as per device  clinic recommendations.  She was given these recommendations last week.  Current medicines are reviewed at length with the patient today.   The patient does not have concerns regarding her medicines.  The following changes were made today:  none  Labs/ tests ordered today include:  No orders of the defined types were placed in this encounter.    Disposition:   FU with Sharyl Panchal 2 months  Signed, Rodrecus Belsky Meredith Leeds, MD  03/15/2018 4:32 PM     [address] [place] [address] [phone] (office) [phone] (fax)

## 2018-03-15 NOTE — Patient Instructions (Addendum)
Medication Instructions:  Your physician recommends that you continue on your current medications as directed. Please refer to the Current Medication list given to you today.  * If you need a refill on your cardiac medications before your next appointment, please call your pharmacy.   Labwork: None ordered  Testing/Procedures: None ordered  Follow-Up: Keep your follow up with Dr. Curt Bears that is scheduled for 06/01/18 @ 9:45 a.m.  *Please note that any paperwork needing to be filled out by the provider will need to be addressed at the front desk prior to seeing the provider. Please note that any FMLA, disability or other documents regarding health condition is subject to a $25.00 charge that must be received prior to completion of paperwork in the form of a money order or check.  Thank you for choosing [facility]!!   Trinidad Curet, RN [phone]

## 2018-03-16 ENCOUNTER — Ambulatory Visit (INDEPENDENT_AMBULATORY_CARE_PROVIDER_SITE_OTHER): Payer: Self-pay | Admitting: Physician Assistant

## 2018-03-16 ENCOUNTER — Other Ambulatory Visit: Payer: Self-pay

## 2018-03-16 ENCOUNTER — Ambulatory Visit
Admission: RE | Admit: 2018-03-16 | Discharge: 2018-03-16 | Disposition: A | Discharge status: Home / Self Care | Payer: Medicare Other | Source: Ambulatory Visit | Attending: Thoracic Surgery (Cardiothoracic Vascular Surgery) | Admitting: Thoracic Surgery (Cardiothoracic Vascular Surgery)

## 2018-03-16 ENCOUNTER — Encounter: Payer: Self-pay | Admitting: Physician Assistant

## 2018-03-16 VITALS — BP 136/78 | HR 77 | Resp 18 | Ht 69.0 in | Wt 130.0 lb

## 2018-03-16 DIAGNOSIS — Z953 Presence of xenogenic heart valve: Secondary | ICD-10-CM

## 2018-03-16 DIAGNOSIS — Z9889 Other specified postprocedural states: Secondary | ICD-10-CM

## 2018-03-16 NOTE — Progress Notes (Signed)
HPI:  Patient returns for routine postoperative follow-up having undergone a mitral Valve replacement, CABG x 2, MAZE, LA clip, closure of PFO, placement of right IJ temporary HD cathter by Dr. Roxy Manns on 02/18/2018. She then had complete Heart block and had a biventricular permanent pacemaker placed on 02/26/2018 By Dr. Curt Bears. She was discharged to [facility] skilled nursing facility. Patient has complaints of incisional pain, feet swollen, and feeling fatigued. She also states right hand is still somewhat numb (she had an IV infiltrate and skin sloughing) while in the hospital. She also has occasional shortness of breath.  Current Outpatient Medications  Medication Sig Dispense Refill  . acetaminophen (TYLENOL) 325 MG tablet Take 2 tablets (650 mg total) by mouth every 6 (six) hours as needed for mild pain, fever or headache. 30 tablet 0  . albuterol (PROVENTIL HFA;VENTOLIN HFA) 108 (90 Base) MCG/ACT inhaler Inhale 2 puffs into the lungs every 6 (six) hours as needed for wheezing or shortness of breath.    . ALPRAZolam (XANAX) 0.25 MG tablet Take 1 tablet (0.25 mg total) by mouth at bedtime as needed for anxiety. 20 tablet 0  . amLODipine (NORVASC) 10 MG tablet Take 1 tablet (10 mg total) by mouth daily. 30 tablet 0  . aspirin EC 81 MG EC tablet Take 1 tablet (81 mg total) by mouth daily. 30 tablet 0  . atorvastatin (LIPITOR) 80 MG tablet Take 1 tablet (80 mg total) by mouth daily. 30 tablet 3  . b complex vitamins tablet Take 1 tablet by mouth daily.    . bisacodyl (DULCOLAX) 5 MG EC tablet Take 5 mg by mouth daily as needed for mild constipation or moderate constipation.    Marland Kitchen buPROPion (WELLBUTRIN) 100 MG tablet Take 100 mg by mouth 3 (three) times daily.    . busPIRone (BUSPAR) 15 MG tablet Take 1 tablet (15 mg total) by mouth 2 (two) times daily. 60 tablet 0  . calcium carbonate (TUMS EX) 750 MG chewable tablet Chew 2 tablets by mouth as needed for heartburn.    . carvedilol (COREG) 6.25  MG tablet Take 1 tablet (6.25 mg total) by mouth 2 (two) times daily with a meal. 60 tablet 3  . cholecalciferol (VITAMIN D) 1000 units tablet Take 1,000 Units by mouth daily.    . diphenhydrAMINE (BENADRYL) 25 MG tablet Take 25 mg by mouth as needed for allergies.    Marland Kitchen doxazosin (CARDURA) 8 MG tablet Take 8 mg (1 tab) in am and 4 mg (1/2 tab) in pm 45 tablet 11  . DULoxetine (CYMBALTA) 30 MG capsule Take 1 capsule (30 mg total) by mouth daily. 30 capsule 0  . gabapentin (NEURONTIN) 300 MG capsule Take 300 mg by mouth at bedtime.  11  . hydrALAZINE (APRESOLINE) 100 MG tablet Take 1 tablet (100 mg total) by mouth every 8 (eight) hours. 90 tablet 3  . isosorbide mononitrate (IMDUR) 120 MG 24 hr tablet Take 1 tablet (120 mg total) by mouth daily. 30 tablet 0  . levothyroxine (SYNTHROID, LEVOTHROID) 150 MCG tablet Take 150 mcg by mouth daily before breakfast.    . Liniments (BEN GAY EX) Apply 1 application topically as needed (back pain).    . Menthol-Methyl Salicylate (BENGAY ARTHRITIS FORMULA EX) Apply topically.    . montelukast (SINGULAIR) 10 MG tablet Take 1 tablet (10 mg total) by mouth daily. (Patient taking differently: Take 10 mg by mouth at bedtime. ) 30 tablet 0  . Nutritional Supplements (CARNATION BREAKFAST ESSENTIALS PO) Take  237 mLs by mouth 2 (two) times daily.    . ondansetron (ZOFRAN) 4 MG tablet Take 1 tablet (4 mg total) by mouth every 8 (eight) hours as needed for nausea or vomiting. 30 tablet 0  . oxyCODONE (OXY IR/ROXICODONE) 5 MG immediate release tablet Take 1 tablet (5 mg total) by mouth every 4 (four) hours as needed for severe pain. 30 tablet 0  . pantoprazole (PROTONIX) 40 MG tablet Take 1 tablet (40 mg total) by mouth 2 (two) times daily before a meal. 60 tablet 0  . potassium chloride SA (K-DUR,KLOR-CON) 20 MEQ tablet Take 2 tablets (40 mEq total) by mouth daily. 60 tablet 3  . sodium-potassium bicarbonate (ALKA-SELTZER GOLD) TBEF dissolvable tablet Take 4 tablets by mouth  daily as needed (heartburn).    . torsemide (DEMADEX) 20 MG tablet Take 100 mg in am and 40 mg in pm    . traZODone (DESYREL) 25 mg TABS tablet Take 25 mg by mouth at bedtime.    Marland Kitchen warfarin (COUMADIN) 2.5 MG tablet Take 1 tablet (2.5 mg total) by mouth daily at 6 PM. 30 tablet 3  Vital Signs: BP 136/78, HR 77, RR 18, Oxygenation 96% on room air   Physical Exam: CV-RRR,no murmur. Rhythm strip shows V pacing Pulmonary-Clear to auscultation bilaterally Extremities-No LE edema. Feet with some swelling Wound-Clean and dry. Eschar removed from sternal wound.  Diagnostic Tests: CLINICAL DATA:  Status post CABG, MVR and Maze dry cough and chest pain  EXAM: CHEST - 2 VIEW  COMPARISON:  02/27/2018, 02/25/2018, 02/24/2018, 02/21/2018  FINDINGS: Post sternotomy changes with valve replacement. Left-sided pacing device similar in orientation. Cardiomegaly with small pleural effusions and bibasilar atelectasis. Aortic atherosclerosis. No pneumothorax.  IMPRESSION: 1. Cardiomegaly 2. Small bilateral pleural effusions, new on the right, stable to minimal decrease on the left. 3. Bibasilar atelectasis   Electronically Signed   By: Donavan Foil M.D.   On: 03/16/2018 14:16  Impression and Plan: Overall, Mrs. Jocelyn Hill is continuing to recover from coronary artery bypass grafting surgery and mitral valve replacement. She has seen Dr. Curt Bears yesterday. EKG showed atrial sensed and V pacing. Patient did have complaints of left arm pain (at biceps area and occurs with extension). She was given exercises and this was not felt secondary to pacemaker implant. She has also seen Joesph July PA-C with heart failure yesterday. Torsemide was increased to 100 mg every morning and 40 mg every evening. I instructed patient to keep legs elevated whenever sitting. Hopefully, increase in diuretic will help with feet swelling. BMET also to be checked in about one week. Patient with known CKD (stage IV). She  has a follow up appointment to see Dr.Upton who will determine if and when she will need to begin HD. She had an INR checked on 09/06 and it was 2.11.  Patient instructed to continue with sternal precautions (I.e. No lifting more than 10 pounds for at least 4 more weeks). She does walk daily at the facility. We did discuss that her fatigue is likely multifactorial and hopefully, will improve with more time. She will return to see Dr. Roxy Manns in about 6 weeks.  Nani Skillern, PA-C [facility] [phone]

## 2018-03-16 NOTE — Patient Instructions (Addendum)
Endocarditis is a potentially serious infection of heart valves or inside lining of the heart.  It occurs more commonly in patients with diseased heart valves (such as patient's with aortic or mitral valve disease) and in patients who have undergone heart valve repair or replacement.  Certain surgical and dental procedures may put you at risk, such as dental cleaning, other dental procedures, or any surgery involving the respiratory, urinary, gastrointestinal tract, gallbladder or prostate gland.   To minimize your chances for develooping endocarditis, maintain good oral health and seek prompt medical attention for any infections involving the mouth, teeth, gums, skin or urinary tract.    Always notify your doctor or dentist about your underlying heart valve condition before having any invasive procedures. You will need to take antibiotics before certain procedures, including all routine dental cleanings or other dental procedures.  Your cardiologist or dentist should prescribe these antibiotics for you to be taken ahead of time.     Continue to avoid any heavy lifting or strenuous use of your arms or shoulders for at least a total of three months from the time of surgery.  After three months you may gradually increase how much you lift or otherwise use your arms or chest as tolerated, with limits based upon whether or not activities lead to the return of significant discomfort.

## 2018-04-01 ENCOUNTER — Telehealth (HOSPITAL_COMMUNITY): Payer: Self-pay | Admitting: *Deleted

## 2018-04-01 NOTE — Telephone Encounter (Signed)
[name] called to report that patient has a lot of swelling her right leg is twice the size of her right leg.Patient has a flat affect, no thrive, and no motivation. Patient lives alone and has recently come home from rehab. Patients daughter [name] lives in [place] she has been staying with patient but will be returning to [employer]. Patients daughter has been giving patient torsemide 120mg  once daily in the pm. Patient is supposed to take  Torsemide 100mg  in the am and 40mg  in the Pm. Patient has not taken any torsemide today.  Per [name] patient should take 100mg  now 40mg  later this evening and start her prescribed dose of 100mg  in the am and 40mg  in the pm. Patient is schedule for an office visit tomorrow at 2pm with Dr.McLean. [name] does not think the patient should live alone she wants to get her back in rehab and the transition into a SNF.

## 2018-04-02 ENCOUNTER — Other Ambulatory Visit: Payer: Self-pay

## 2018-04-02 ENCOUNTER — Ambulatory Visit (HOSPITAL_COMMUNITY)
Admission: RE | Admit: 2018-04-02 | Discharge: 2018-04-02 | Disposition: A | Discharge status: Home / Self Care | Payer: Medicare Other | Source: Ambulatory Visit | Attending: Cardiology | Admitting: Cardiology

## 2018-04-02 ENCOUNTER — Encounter (HOSPITAL_COMMUNITY): Payer: Self-pay | Admitting: Cardiology

## 2018-04-02 VITALS — BP 134/78 | HR 87 | Wt 133.4 lb

## 2018-04-02 DIAGNOSIS — Z950 Presence of cardiac pacemaker: Secondary | ICD-10-CM | POA: Insufficient documentation

## 2018-04-02 DIAGNOSIS — E785 Hyperlipidemia, unspecified: Secondary | ICD-10-CM | POA: Diagnosis not present

## 2018-04-02 DIAGNOSIS — Z79899 Other long term (current) drug therapy: Secondary | ICD-10-CM | POA: Insufficient documentation

## 2018-04-02 DIAGNOSIS — Z7901 Long term (current) use of anticoagulants: Secondary | ICD-10-CM | POA: Diagnosis not present

## 2018-04-02 DIAGNOSIS — E1122 Type 2 diabetes mellitus with diabetic chronic kidney disease: Secondary | ICD-10-CM | POA: Insufficient documentation

## 2018-04-02 DIAGNOSIS — Z79890 Hormone replacement therapy (postmenopausal): Secondary | ICD-10-CM | POA: Insufficient documentation

## 2018-04-02 DIAGNOSIS — Z953 Presence of xenogenic heart valve: Secondary | ICD-10-CM | POA: Diagnosis not present

## 2018-04-02 DIAGNOSIS — Z87891 Personal history of nicotine dependence: Secondary | ICD-10-CM | POA: Insufficient documentation

## 2018-04-02 DIAGNOSIS — I480 Paroxysmal atrial fibrillation: Secondary | ICD-10-CM

## 2018-04-02 DIAGNOSIS — Z8673 Personal history of transient ischemic attack (TIA), and cerebral infarction without residual deficits: Secondary | ICD-10-CM | POA: Diagnosis not present

## 2018-04-02 DIAGNOSIS — K219 Gastro-esophageal reflux disease without esophagitis: Secondary | ICD-10-CM | POA: Diagnosis not present

## 2018-04-02 DIAGNOSIS — I5032 Chronic diastolic (congestive) heart failure: Secondary | ICD-10-CM | POA: Insufficient documentation

## 2018-04-02 DIAGNOSIS — I2510 Atherosclerotic heart disease of native coronary artery without angina pectoris: Secondary | ICD-10-CM | POA: Insufficient documentation

## 2018-04-02 DIAGNOSIS — I252 Old myocardial infarction: Secondary | ICD-10-CM | POA: Insufficient documentation

## 2018-04-02 DIAGNOSIS — I130 Hypertensive heart and chronic kidney disease with heart failure and stage 1 through stage 4 chronic kidney disease, or unspecified chronic kidney disease: Secondary | ICD-10-CM | POA: Insufficient documentation

## 2018-04-02 DIAGNOSIS — I5022 Chronic systolic (congestive) heart failure: Secondary | ICD-10-CM

## 2018-04-02 DIAGNOSIS — N184 Chronic kidney disease, stage 4 (severe): Secondary | ICD-10-CM

## 2018-04-02 DIAGNOSIS — Z8249 Family history of ischemic heart disease and other diseases of the circulatory system: Secondary | ICD-10-CM | POA: Diagnosis not present

## 2018-04-02 DIAGNOSIS — Z7982 Long term (current) use of aspirin: Secondary | ICD-10-CM | POA: Insufficient documentation

## 2018-04-02 DIAGNOSIS — N1330 Unspecified hydronephrosis: Secondary | ICD-10-CM | POA: Diagnosis not present

## 2018-04-02 DIAGNOSIS — F329 Major depressive disorder, single episode, unspecified: Secondary | ICD-10-CM | POA: Diagnosis not present

## 2018-04-02 DIAGNOSIS — I442 Atrioventricular block, complete: Secondary | ICD-10-CM

## 2018-04-02 DIAGNOSIS — N185 Chronic kidney disease, stage 5: Secondary | ICD-10-CM | POA: Diagnosis not present

## 2018-04-02 DIAGNOSIS — I340 Nonrheumatic mitral (valve) insufficiency: Secondary | ICD-10-CM | POA: Insufficient documentation

## 2018-04-02 DIAGNOSIS — Z951 Presence of aortocoronary bypass graft: Secondary | ICD-10-CM | POA: Diagnosis not present

## 2018-04-02 DIAGNOSIS — F419 Anxiety disorder, unspecified: Secondary | ICD-10-CM | POA: Diagnosis not present

## 2018-04-02 LAB — LIPID PANEL
Cholesterol: 89 mg/dL (ref 0–200)
HDL: 46 mg/dL (ref >40)
LDL Cholesterol: 30 mg/dL (ref 0–99)
Total CHOL/HDL Ratio: 1.9 RATIO
Triglycerides: 63 mg/dL (ref <150)
VLDL: 13 mg/dL (ref 0–40)

## 2018-04-02 LAB — BASIC METABOLIC PANEL
Anion gap: 11 (ref 5–15)
BUN: 40 mg/dL — ABNORMAL HIGH (ref 8–23)
CO2: 25 mmol/L (ref 22–32)
Calcium: 9.3 mg/dL (ref 8.9–10.3)
Chloride: 103 mmol/L (ref 98–111)
Creatinine, Ser: 4.31 mg/dL — ABNORMAL HIGH (ref 0.44–1.00)
GFR calc Af Amer: 11 mL/min — ABNORMAL LOW (ref >60)
GFR calc non Af Amer: 10 mL/min — ABNORMAL LOW (ref >60)
Glucose, Bld: 139 mg/dL — ABNORMAL HIGH (ref 70–99)
Potassium: 4.1 mmol/L (ref 3.5–5.1)
Sodium: 139 mmol/L (ref 135–145)

## 2018-04-02 MED ORDER — TORSEMIDE 20 MG PO TABS: ORAL_TABLET | ORAL | Status: DC

## 2018-04-02 NOTE — Patient Instructions (Signed)
Today you have been seen at the Heart failure clinic at [facility]   Medication changes:  Increase coreg to 6.25 mg twice a day Increase torsemide to 100 mg in AM and 60 mg in the PM  Lab work done today: BMET, Lipids  Scheduled Lab work: BMET 2 weeks  Follow up with NP/PA in 2 weeks  You had an EKG today

## 2018-04-04 NOTE — Progress Notes (Signed)
Advanced Heart Failure Clinic Note   PCP: Dr. Dorita Fray ([place]) HF Cardiology: Dr. Aundra Dubin  HPI: Jocelyn Hill is a 67 y.o. female with h/o mitral regurgitation, chronic diastolic CHF, CAD s/p MI, long standing HTN, DM2, CKD IV-V, h/o Stroke. Anxiety, depression and GERD. Patient reported that she has had 2 MIs treated at [facility] in [place] several years ago.   Presented to [facility] on 03/12/2017 with acute hypoxic respiratory failure. ECHO showed normal LV function with severe MR. Diuresed with IV lasix over 20 pounds. She was being considered for MVR by Dr Roxy Manns but due to multiple complications she was not a candidate. Hospital course was complicated by acute respiratory failure, acute renal failure, atrial fibrillation, retroperitoneal bleed, r hydronephrosis (perc tube) and E coli bacteremia. She was started on anticoagulation for atrial fibrillation but this was stopped with RP hematoma.  She converted to NSR on amiodarone but developed junctional rhythm so amiodarone was stopped. Creatinine peaked at 5.  She did not require dialysis and gradually renal function improved. Discharged to SNF without diuretics. Discharge weight was 124 pounds.   Admitted again 04/12/17-04/23/17 with respiratory failure from [facility]. She had gained 16 pounds at SNF as she was not on diuretic.  CXR with bilateral infiltrates. Placed on vanc and zosyn. Pertinent admission labs included BNP > 3000, creatinine 3.75, K 4.1, WBC 16.1, procalcitonin 2.06, and troponin 0.04. Requiring 100% high flow oxygen. Blood CX - NGTD. Diuresing with high dose IV lasix.  Nephrology consulted, renal US with no evidence of hydronephrosis (pt had percutaneous nephrostomy drain the month prior due to this). Also placed on NTG drip for HTN and CP. She was seen by Dr. Roxy Manns again and felt still to not be a MVR candidate. Discharged on Lasix 160 mg BID. Discharge weight was 114 pounds.   TTE 03/12/17 LVEF 60-65%,  Grade 2 DD, Severe MR, Severe LAE, Mild RAE, Mild/Mod TR, PA peak pressure 60 mm hg, small/mod pericardial effusion.  TEE 03/17/17 LVEF 55-60%, Trivial TR, Severe MR, mild LAE, mild mod TR. No evidence of vegetation.  LHC/RHC in 1/19 showed 90% ostial LCx, 60% mid LAD.   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   She was admitted in 8/19 for Bioprosthetic MVR, Maze, LA appendage clip, PFO closure, and CABG with LIMA-LAD and SVG-LCx.  This was complicated by AKI and complete heart block.  St Jude CRT-P device placed.  Echo in 8/19 post-op showed EF 45-50%, stable bioprosthetic mitral valve.    She returns today for followup of CHF and CAD.  She is in NSR today.  She remains very weak.  She is at home, daughter is staying with her.  She has a home health nurse and is getting home PT.  Pain at pacemaker site and surgical site.  Walks slowly with walker, gets short of breath walking more than 50 feet or so.  No lightheadedness.  No orthopnea/PND. Her right hand has been tingling since surgery.   ECG (personally reviewed): NSR, BiV paced  Labs (10/18): K 3.8, creatinine 2.88 Labs (12/18): LDL 64, HDL 66 Labs (3/19): hgb 11.4 Labs (4/19): K 3.9, creatinine 3.97 Labs (7/19): K 4.2, creatinine 3.6 Labs (9/19): K 4.8, creatinine 4.29  Review of Systems: All systems reviewed and negative except as per HPI.   PMH: 1. Chronic diastolic CHF:  - TTE 07/05/24 LVEF 60-65%, Grade 2 DD, Severe MR,  Severe LAE, Mild RAE, Mild/Mod TR, PA peak pressure 60 mm hg, small/mod pericardial effusion. - RHC (1/19): mean RA 2, PA 37/12 mean 26, mean PCWP 13, CI 2.98 - Echo (8/19) with EF 45-50%, bioprosthetic MV stable.  - St Jude CRT-P for complete heart block.   2. CKD: stage IV.  3. Anxiety/panic attacks 4. DM 5. HTN 6. CAD: Patient reports prior MI treated at [facility].  No records available - LHC in  1/19 showed 90% ostial LCx, 60% mid LAD.  - CABG 8/19 with LIMA-LAD and SVG-LCx.  7. Retroperitoneal hematoma: With anticoagulation in 9/18.  8. Hydronephrosis due to RP hematoma: Resolved.  9. GERD 10. Hyperlipidemia 11. H/o CVA 12. 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  - TEE 03/17/17 LVEF 55-60%, Trivial TR, Severe MR, mild LAE, mild mod TR. No evidence of vegetation. 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  - Bioprosthetic MV replacement in 8/19.  13. Atrial fibrillation: Paroxysmal.  Noted in 9/18, not anticoagulated currently due to RP hematoma.  - Junctional rhythm on amiodarone, amiodarone stopped. - Maze 8/19.  14. PFO: s/p closure 8/19.  15. Complete heart block post-op 8/19: Now has St Jude CRT-P device.    Current Outpatient Medications  Medication Sig Dispense Refill  . acetaminophen (TYLENOL) 325 MG tablet Take 2 tablets (650 mg total) by mouth every 6 (six) hours as needed for mild pain, fever or headache. 30 tablet 0  . albuterol (PROVENTIL HFA;VENTOLIN HFA) 108 (90 Base) MCG/ACT inhaler Inhale 2 puffs into the lungs every 6 (six) hours as needed for wheezing or shortness of breath.    . ALPRAZolam (XANAX) 0.25 MG tablet Take 1 tablet (0.25 mg total) by mouth at bedtime as needed for anxiety. 20 tablet 0  . amLODipine (NORVASC) 10 MG tablet Take 1 tablet (10 mg total) by mouth daily. 30 tablet 0  . aspirin EC 81 MG EC tablet Take 1 tablet (81 mg total) by mouth daily. 30 tablet 0  . atorvastatin (LIPITOR) 80 MG tablet Take 1 tablet (80 mg total) by mouth daily. 30 tablet 3  . b complex vitamins tablet Take 1 tablet by mouth daily.    . bisacodyl (DULCOLAX) 5 MG EC tablet Take 5 mg by mouth daily as needed for mild constipation or moderate constipation.    Marland Kitchen buPROPion (WELLBUTRIN) 100 MG tablet Take 100 mg by mouth 3 (three) times  daily.    . busPIRone (BUSPAR) 15 MG tablet Take 1 tablet (15 mg total) by mouth 2 (two) times daily. 60 tablet 0  . calcium carbonate (TUMS EX) 750 MG chewable tablet Chew 2 tablets by mouth as needed for heartburn.    . carvedilol (COREG) 6.25 MG tablet Take 6.25 mg by mouth 2 (two) times daily.    . cholecalciferol (VITAMIN D) 1000 units tablet Take 1,000 Units by mouth daily.    . diphenhydrAMINE (BENADRYL) 25 MG tablet Take 25 mg by mouth as needed for allergies.    Marland Kitchen doxazosin (CARDURA) 8 MG tablet Take 8 mg (1 tab) in am and 4 mg (1/2 tab) in pm 45 tablet 11  . DULoxetine (CYMBALTA) 30 MG capsule Take 1 capsule (30 mg total) by mouth daily. 30 capsule 0  . gabapentin (NEURONTIN) 300 MG capsule Take 300 mg by mouth at bedtime.  11  . hydrALAZINE (APRESOLINE) 100 MG tablet  Take 1 tablet (100 mg total) by mouth every 8 (eight) hours. 90 tablet 3  . isosorbide mononitrate (IMDUR) 120 MG 24 hr tablet Take 1 tablet (120 mg total) by mouth daily. 30 tablet 0  . levothyroxine (SYNTHROID, LEVOTHROID) 150 MCG tablet Take 150 mcg by mouth daily before breakfast.    . Liniments (BEN GAY EX) Apply 1 application topically as needed (back pain).    . Menthol-Methyl Salicylate (BENGAY ARTHRITIS FORMULA EX) Apply topically.    . montelukast (SINGULAIR) 10 MG tablet Take 1 tablet (10 mg total) by mouth daily. 30 tablet 0  . Nutritional Supplements (CARNATION BREAKFAST ESSENTIALS PO) Take 237 mLs by mouth 2 (two) times daily.    . ondansetron (ZOFRAN) 4 MG tablet Take 1 tablet (4 mg total) by mouth every 8 (eight) hours as needed for nausea or vomiting. 30 tablet 0  . oxyCODONE (OXY IR/ROXICODONE) 5 MG immediate release tablet Take 1 tablet (5 mg total) by mouth every 4 (four) hours as needed for severe pain. 30 tablet 0  . pantoprazole (PROTONIX) 40 MG tablet Take 1 tablet (40 mg total) by mouth 2 (two) times daily before a meal. 60 tablet 0  . potassium chloride SA (K-DUR,KLOR-CON) 20 MEQ tablet Take 20 mEq  by mouth daily.    . sodium-potassium bicarbonate (ALKA-SELTZER GOLD) TBEF dissolvable tablet Take 4 tablets by mouth daily as needed (heartburn).    . torsemide (DEMADEX) 20 MG tablet Take 100 mg in am and 60 mg in pm    . traZODone (DESYREL) 25 mg TABS tablet Take 25 mg by mouth at bedtime.    Marland Kitchen warfarin (COUMADIN) 2.5 MG tablet Take 1 tablet (2.5 mg total) by mouth daily at 6 PM. 30 tablet 3   No current facility-administered medications for this encounter.     Allergies  Allergen Reactions  . Ace Inhibitors Anaphylaxis and Swelling  . Motrin Ib [Ibuprofen] Anaphylaxis      Social History   Socioeconomic History  . Marital status: Divorced    Spouse name: Not on file  . Number of children: Not on file  . Years of education: Not on file  . Highest education level: Not on file  Occupational History  . Not on file  Social Needs  . Financial resource strain: Not on file  . Food insecurity:    Worry: Not on file    Inability: Not on file  . Transportation needs:    Medical: Not on file    Non-medical: Not on file  Tobacco Use  . Smoking status: Former Smoker    Types: Cigarettes  . Smokeless tobacco: Never Used  Substance and Sexual Activity  . Alcohol use: No  . Drug use: No    Comment: marijuana in the past  . Sexual activity: Not Currently  Lifestyle  . Physical activity:    Days per week: Not on file    Minutes per session: Not on file  . Stress: Not on file  Relationships  . Social connections:    Talks on phone: Not on file    Gets together: Not on file    Attends religious service: Not on file    Active member of club or organization: Not on file    Attends meetings of clubs or organizations: Not on file    Relationship status: Not on file  . Intimate partner violence:    Fear of current or ex partner: Not on file    Emotionally abused: Not on  file    Physically abused: Not on file    Forced sexual activity: Not on file  Other Topics Concern  . Not  on file  Social History Narrative  . Not on file      Family History  Problem Relation Age of Onset  . Hypertension Mother     Vitals:   04/02/18 1413  BP: 134/78  Pulse: 87  SpO2: 97%  Weight: 60.5 kg (133 lb 6 oz)     PHYSICAL EXAM: General: NAD Neck: JVP 8-9 cm, no thyromegaly or thyroid nodule.  Lungs: Slight crackles at bases bilaterally.  CV: Nondisplaced PMI.  Heart regular S1/S2, no S3/S4, no murmur.  1+ left ankle edema, 1+ edema 1/2 to knee on right.  No carotid bruit.  Difficult to palpate pedal pulses.  Abdomen: Soft, nontender, no hepatosplenomegaly, no distention.  Skin: Intact without lesions or rashes.  Neurologic: Alert and oriented x 3.  Psych: Normal affect. Extremities: No clubbing or cyanosis.  HEENT: Normal.   ASSESSMENT & PLAN: 1. Chronic primarily diastolic CHF: Echo (1/06, post-op) with EF 45-50%.  Has St Jude CRT-P device due to post-op complete heart block. On exam, she is volume overloaded.  NYHA class III symptoms. Significant post-op deconditioning.  - Increase torsemide to 100 qam/60 qpm.  BMET today and again in 10 days.  - Change Coreg to 6.25 mg bid (has been taking once a day).  - Continue hydralazine/Imdur.  2. CKD Stage IV:  Had R hydronephrosis in setting of hydronephrosis and required percutaneous drain in 9/18 (removed). Renal US 04/14/17 showed no further evidence of hydronephrosis. Most recent creatinine 4.29.  I had suspected that she would need to start HD peri-operatively, but she did not.           - BMET today.  Close followup with nephrology.    3. HTN: BP controlled.     - Continue hydralazine 100 mg tid.   - Continue amlodipine 10 mg daily.  - Increase Coreg to 6.25 mg bid rather than qd.  - Continue doxazosin to 8 qam/4 qpm.    4.  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  Had bioprosthetic MVR in 8/19.  Post-op echo in 8/19 showed stable bioprosthetic mitral valve.  5. Atrial fibrillation: Paroxysmal.  Now s/p Maze in  8/19. NSR today.  - Continue warfarin.  6. CAD:  LHC in 1/19 showed severe ostial LCx disease and moderate LAD disease. She had LIMA-LAD and SVG-OM in 8/19.  - Continue ASA 81 and atorvastatin 80 mg daily. Check lipids today.  - She is on Imdur 120 mg daily and Coreg 6.25 mg bid.  7. Will ask social worker to help her with possible nursing home placement for when her daughter has to go back down to [place]. I do not think that she is safe to live alone.   Followup with NP/PA in 2 wks to reassess volume.   [name], MD 04/04/18

## 2018-04-08 ENCOUNTER — Telehealth (HOSPITAL_COMMUNITY): Payer: Self-pay | Admitting: Licensed Clinical Social Worker

## 2018-04-08 NOTE — Telephone Encounter (Signed)
CSW received consult from Dr. Aundra Dubin to reach out to patient in regards to SNF placement.  Patient went to [facility] following recent hospital stay discharged back home about 2 weeks ago.  Patient still struggling at home with mobility and ADLs and family inquiring about possible long term placement.   CSW called patient to discuss.  Patient confirms that she has been struggling at home and is interested in transitioning to long term care.  Patient's daughter, [name], was staying with her for a time after SNF but went back to [place] last week.  Patient is now getting help from daughter, [name], who lives next door but is not available all the time.  [name] is assisting patient with bathing and is cooking all her meals- patient can walk independently with walker but not for very long distances.  Patient reports that Medicaid has been applied for but that it has already been denied (states she was informed last week).  Patient thinks that the application was completed by [facility] RN and PT but per dtr they only came out once the Friday after patients DC from SNF and have not been out since.    Patient makes $1,100/month from social security and reports no other source of income- patient also owns land in [place] which she reports was the reason she was denied Medicaid.  CSW spoke with [name] representative, Tommi Rumps, to get updates.  Reports that patient was seen 10/4 by RN and PT but that she was never admitted to services since home environment was considered unsafe- per home health they sent PT note to [facility] and spoke with admissions about readmitting patient into rehab.  CSW spoke with [facility], [place], regarding the situation.  She reports that she did speak with [name] about trying to readmit patient but never received PT notes.  CSW requested [facility] resend PT.  CSW updated patient and daughter, [name], but informed them that insurance  authorization for SNF was not guaranteed since she was just discharged from SNF and there were no new concerns.  Encouraged pt and pt daughter to reach out to Medicaid case worker and inquire how patient needs to go about qualifying for Medicaid.  No further needs reported at this time- CSW continues to be available if further needs arise  Jorge Ny, [place] Social Worker [phone]

## 2018-04-15 ENCOUNTER — Ambulatory Visit: Payer: Medicare Other | Admitting: Vascular Surgery

## 2018-04-16 ENCOUNTER — Inpatient Hospital Stay (HOSPITAL_COMMUNITY)
Admission: EM | Admit: 2018-04-16 | Discharge: 2018-05-01 | DRG: 264 | Disposition: A | Discharge status: Skilled Nursing Facility | Payer: Medicare Other | Source: Ambulatory Visit | Attending: Family Medicine | Admitting: Family Medicine

## 2018-04-16 ENCOUNTER — Emergency Department (HOSPITAL_COMMUNITY): Payer: Medicare Other

## 2018-04-16 ENCOUNTER — Encounter (HOSPITAL_COMMUNITY): Payer: Self-pay

## 2018-04-16 ENCOUNTER — Other Ambulatory Visit: Payer: Self-pay

## 2018-04-16 ENCOUNTER — Encounter (HOSPITAL_COMMUNITY): Payer: Self-pay | Admitting: *Deleted

## 2018-04-16 ENCOUNTER — Ambulatory Visit (HOSPITAL_BASED_OUTPATIENT_CLINIC_OR_DEPARTMENT_OTHER)
Admission: RE | Admit: 2018-04-16 | Discharge: 2018-04-16 | Disposition: A | Discharge status: Home / Self Care | Payer: Medicare Other | Source: Ambulatory Visit | Attending: Internal Medicine | Admitting: Internal Medicine

## 2018-04-16 ENCOUNTER — Telehealth (HOSPITAL_COMMUNITY): Payer: Self-pay | Admitting: *Deleted

## 2018-04-16 ENCOUNTER — Encounter: Payer: Self-pay | Admitting: Vascular Surgery

## 2018-04-16 VITALS — BP 168/98 | HR 79 | Wt 143.8 lb

## 2018-04-16 DIAGNOSIS — I5032 Chronic diastolic (congestive) heart failure: Secondary | ICD-10-CM | POA: Insufficient documentation

## 2018-04-16 DIAGNOSIS — G253 Myoclonus: Secondary | ICD-10-CM | POA: Diagnosis present

## 2018-04-16 DIAGNOSIS — R7989 Other specified abnormal findings of blood chemistry: Secondary | ICD-10-CM

## 2018-04-16 DIAGNOSIS — E440 Moderate protein-calorie malnutrition: Secondary | ICD-10-CM | POA: Diagnosis present

## 2018-04-16 DIAGNOSIS — I480 Paroxysmal atrial fibrillation: Secondary | ICD-10-CM

## 2018-04-16 DIAGNOSIS — Z79890 Hormone replacement therapy (postmenopausal): Secondary | ICD-10-CM

## 2018-04-16 DIAGNOSIS — M79602 Pain in left arm: Secondary | ICD-10-CM

## 2018-04-16 DIAGNOSIS — E1122 Type 2 diabetes mellitus with diabetic chronic kidney disease: Secondary | ICD-10-CM | POA: Diagnosis present

## 2018-04-16 DIAGNOSIS — I130 Hypertensive heart and chronic kidney disease with heart failure and stage 1 through stage 4 chronic kidney disease, or unspecified chronic kidney disease: Secondary | ICD-10-CM | POA: Insufficient documentation

## 2018-04-16 DIAGNOSIS — R200 Anesthesia of skin: Secondary | ICD-10-CM | POA: Diagnosis not present

## 2018-04-16 DIAGNOSIS — I5042 Chronic combined systolic (congestive) and diastolic (congestive) heart failure: Secondary | ICD-10-CM

## 2018-04-16 DIAGNOSIS — I252 Old myocardial infarction: Secondary | ICD-10-CM | POA: Insufficient documentation

## 2018-04-16 DIAGNOSIS — Z6821 Body mass index (BMI) 21.0-21.9, adult: Secondary | ICD-10-CM

## 2018-04-16 DIAGNOSIS — N184 Chronic kidney disease, stage 4 (severe): Secondary | ICD-10-CM

## 2018-04-16 DIAGNOSIS — F064 Anxiety disorder due to known physiological condition: Secondary | ICD-10-CM

## 2018-04-16 DIAGNOSIS — Z79899 Other long term (current) drug therapy: Secondary | ICD-10-CM | POA: Insufficient documentation

## 2018-04-16 DIAGNOSIS — I5022 Chronic systolic (congestive) heart failure: Secondary | ICD-10-CM | POA: Diagnosis not present

## 2018-04-16 DIAGNOSIS — Z953 Presence of xenogenic heart valve: Secondary | ICD-10-CM

## 2018-04-16 DIAGNOSIS — I442 Atrioventricular block, complete: Secondary | ICD-10-CM | POA: Diagnosis present

## 2018-04-16 DIAGNOSIS — E785 Hyperlipidemia, unspecified: Secondary | ICD-10-CM | POA: Diagnosis present

## 2018-04-16 DIAGNOSIS — Z8249 Family history of ischemic heart disease and other diseases of the circulatory system: Secondary | ICD-10-CM | POA: Insufficient documentation

## 2018-04-16 DIAGNOSIS — Z7901 Long term (current) use of anticoagulants: Secondary | ICD-10-CM

## 2018-04-16 DIAGNOSIS — N186 End stage renal disease: Secondary | ICD-10-CM | POA: Diagnosis present

## 2018-04-16 DIAGNOSIS — M7989 Other specified soft tissue disorders: Secondary | ICD-10-CM | POA: Diagnosis not present

## 2018-04-16 DIAGNOSIS — F419 Anxiety disorder, unspecified: Secondary | ICD-10-CM | POA: Diagnosis present

## 2018-04-16 DIAGNOSIS — Z79891 Long term (current) use of opiate analgesic: Secondary | ICD-10-CM

## 2018-04-16 DIAGNOSIS — N1330 Unspecified hydronephrosis: Secondary | ICD-10-CM | POA: Insufficient documentation

## 2018-04-16 DIAGNOSIS — R110 Nausea: Secondary | ICD-10-CM | POA: Diagnosis not present

## 2018-04-16 DIAGNOSIS — D638 Anemia in other chronic diseases classified elsewhere: Secondary | ICD-10-CM

## 2018-04-16 DIAGNOSIS — Y838 Other surgical procedures as the cause of abnormal reaction of the patient, or of later complication, without mention of misadventure at the time of the procedure: Secondary | ICD-10-CM | POA: Diagnosis not present

## 2018-04-16 DIAGNOSIS — I5043 Acute on chronic combined systolic (congestive) and diastolic (congestive) heart failure: Secondary | ICD-10-CM | POA: Diagnosis not present

## 2018-04-16 DIAGNOSIS — R51 Headache: Secondary | ICD-10-CM | POA: Diagnosis present

## 2018-04-16 DIAGNOSIS — F329 Major depressive disorder, single episode, unspecified: Secondary | ICD-10-CM | POA: Insufficient documentation

## 2018-04-16 DIAGNOSIS — I132 Hypertensive heart and chronic kidney disease with heart failure and with stage 5 chronic kidney disease, or end stage renal disease: Secondary | ICD-10-CM | POA: Diagnosis not present

## 2018-04-16 DIAGNOSIS — T82848A Pain from vascular prosthetic devices, implants and grafts, initial encounter: Secondary | ICD-10-CM | POA: Diagnosis not present

## 2018-04-16 DIAGNOSIS — Z952 Presence of prosthetic heart valve: Secondary | ICD-10-CM

## 2018-04-16 DIAGNOSIS — Z87891 Personal history of nicotine dependence: Secondary | ICD-10-CM

## 2018-04-16 DIAGNOSIS — R42 Dizziness and giddiness: Secondary | ICD-10-CM | POA: Diagnosis not present

## 2018-04-16 DIAGNOSIS — G8929 Other chronic pain: Secondary | ICD-10-CM | POA: Diagnosis present

## 2018-04-16 DIAGNOSIS — E039 Hypothyroidism, unspecified: Secondary | ICD-10-CM | POA: Diagnosis present

## 2018-04-16 DIAGNOSIS — Z7982 Long term (current) use of aspirin: Secondary | ICD-10-CM

## 2018-04-16 DIAGNOSIS — I509 Heart failure, unspecified: Secondary | ICD-10-CM

## 2018-04-16 DIAGNOSIS — I472 Ventricular tachycardia: Secondary | ICD-10-CM | POA: Diagnosis not present

## 2018-04-16 DIAGNOSIS — R778 Other specified abnormalities of plasma proteins: Secondary | ICD-10-CM

## 2018-04-16 DIAGNOSIS — Z951 Presence of aortocoronary bypass graft: Secondary | ICD-10-CM | POA: Insufficient documentation

## 2018-04-16 DIAGNOSIS — Z992 Dependence on renal dialysis: Secondary | ICD-10-CM

## 2018-04-16 DIAGNOSIS — E1165 Type 2 diabetes mellitus with hyperglycemia: Secondary | ICD-10-CM | POA: Diagnosis present

## 2018-04-16 DIAGNOSIS — K219 Gastro-esophageal reflux disease without esophagitis: Secondary | ICD-10-CM

## 2018-04-16 DIAGNOSIS — D631 Anemia in chronic kidney disease: Secondary | ICD-10-CM

## 2018-04-16 DIAGNOSIS — I447 Left bundle-branch block, unspecified: Secondary | ICD-10-CM | POA: Diagnosis present

## 2018-04-16 DIAGNOSIS — N185 Chronic kidney disease, stage 5: Secondary | ICD-10-CM

## 2018-04-16 DIAGNOSIS — Z5181 Encounter for therapeutic drug level monitoring: Secondary | ICD-10-CM

## 2018-04-16 DIAGNOSIS — F411 Generalized anxiety disorder: Secondary | ICD-10-CM

## 2018-04-16 DIAGNOSIS — T82398D Other mechanical complication of other vascular grafts, subsequent encounter: Secondary | ICD-10-CM

## 2018-04-16 DIAGNOSIS — J90 Pleural effusion, not elsewhere classified: Secondary | ICD-10-CM

## 2018-04-16 DIAGNOSIS — I340 Nonrheumatic mitral (valve) insufficiency: Secondary | ICD-10-CM

## 2018-04-16 DIAGNOSIS — Z888 Allergy status to other drugs, medicaments and biological substances status: Secondary | ICD-10-CM

## 2018-04-16 DIAGNOSIS — T82898A Other specified complication of vascular prosthetic devices, implants and grafts, initial encounter: Secondary | ICD-10-CM

## 2018-04-16 DIAGNOSIS — Z8774 Personal history of (corrected) congenital malformations of heart and circulatory system: Secondary | ICD-10-CM

## 2018-04-16 DIAGNOSIS — N179 Acute kidney failure, unspecified: Secondary | ICD-10-CM | POA: Diagnosis not present

## 2018-04-16 DIAGNOSIS — Z950 Presence of cardiac pacemaker: Secondary | ICD-10-CM

## 2018-04-16 DIAGNOSIS — N2581 Secondary hyperparathyroidism of renal origin: Secondary | ICD-10-CM | POA: Diagnosis present

## 2018-04-16 DIAGNOSIS — Z90710 Acquired absence of both cervix and uterus: Secondary | ICD-10-CM

## 2018-04-16 DIAGNOSIS — Z886 Allergy status to analgesic agent status: Secondary | ICD-10-CM

## 2018-04-16 DIAGNOSIS — I272 Pulmonary hypertension, unspecified: Secondary | ICD-10-CM | POA: Diagnosis present

## 2018-04-16 DIAGNOSIS — D509 Iron deficiency anemia, unspecified: Secondary | ICD-10-CM | POA: Diagnosis present

## 2018-04-16 DIAGNOSIS — Z8701 Personal history of pneumonia (recurrent): Secondary | ICD-10-CM

## 2018-04-16 DIAGNOSIS — Z23 Encounter for immunization: Secondary | ICD-10-CM

## 2018-04-16 DIAGNOSIS — R350 Frequency of micturition: Secondary | ICD-10-CM | POA: Diagnosis present

## 2018-04-16 DIAGNOSIS — M1990 Unspecified osteoarthritis, unspecified site: Secondary | ICD-10-CM | POA: Diagnosis present

## 2018-04-16 DIAGNOSIS — I248 Other forms of acute ischemic heart disease: Secondary | ICD-10-CM | POA: Diagnosis present

## 2018-04-16 DIAGNOSIS — Z8673 Personal history of transient ischemic attack (TIA), and cerebral infarction without residual deficits: Secondary | ICD-10-CM

## 2018-04-16 DIAGNOSIS — I2510 Atherosclerotic heart disease of native coronary artery without angina pectoris: Secondary | ICD-10-CM | POA: Diagnosis present

## 2018-04-16 DIAGNOSIS — E1121 Type 2 diabetes mellitus with diabetic nephropathy: Secondary | ICD-10-CM | POA: Diagnosis present

## 2018-04-16 LAB — BASIC METABOLIC PANEL
Anion gap: 8 (ref 5–15)
Anion gap: 15 (ref 5–15)
BUN: 60 mg/dL — ABNORMAL HIGH (ref 8–23)
BUN: 59 mg/dL — ABNORMAL HIGH (ref 8–23)
CO2: 24 mmol/L (ref 22–32)
CO2: 21 mmol/L — ABNORMAL LOW (ref 22–32)
Calcium: 9.3 mg/dL (ref 8.9–10.3)
Calcium: 9.4 mg/dL (ref 8.9–10.3)
Chloride: 104 mmol/L (ref 98–111)
Chloride: 100 mmol/L (ref 98–111)
Creatinine, Ser: 6 mg/dL — ABNORMAL HIGH (ref 0.44–1.00)
Creatinine, Ser: 6.03 mg/dL — ABNORMAL HIGH (ref 0.44–1.00)
GFR calc Af Amer: 8 mL/min — ABNORMAL LOW (ref >60)
GFR calc Af Amer: 8 mL/min — ABNORMAL LOW (ref >60)
GFR calc non Af Amer: 7 mL/min — ABNORMAL LOW (ref >60)
GFR calc non Af Amer: 7 mL/min — ABNORMAL LOW (ref >60)
Glucose, Bld: 92 mg/dL (ref 70–99)
Glucose, Bld: 229 mg/dL — ABNORMAL HIGH (ref 70–99)
Potassium: 4.8 mmol/L (ref 3.5–5.1)
Potassium: 4.7 mmol/L (ref 3.5–5.1)
Sodium: 136 mmol/L (ref 135–145)
Sodium: 136 mmol/L (ref 135–145)

## 2018-04-16 LAB — CBC
HCT: 26.9 % — ABNORMAL LOW (ref 36.0–46.0)
Hemoglobin: 8.7 g/dL — ABNORMAL LOW (ref 12.0–15.0)
MCH: 24.6 pg — ABNORMAL LOW (ref 26.0–34.0)
MCHC: 32.3 g/dL (ref 30.0–36.0)
MCV: 76.2 fL — ABNORMAL LOW (ref 80.0–100.0)
Platelets: 281 10*3/uL (ref 150–400)
RBC: 3.53 MIL/uL — ABNORMAL LOW (ref 3.87–5.11)
RDW: 14.6 % (ref 11.5–15.5)
WBC: 6.9 10*3/uL (ref 4.0–10.5)
nRBC: 0 % (ref 0.0–0.2)

## 2018-04-16 LAB — TROPONIN I: Troponin I: 0.07 ng/mL (ref <0.03)

## 2018-04-16 LAB — HEPATIC FUNCTION PANEL
ALT: 15 U/L (ref 0–44)
AST: 16 U/L (ref 15–41)
Albumin: 3.3 g/dL — ABNORMAL LOW (ref 3.5–5.0)
Alkaline Phosphatase: 76 U/L (ref 38–126)
Bilirubin, Direct: 0.2 mg/dL (ref 0.0–0.2)
Indirect Bilirubin: 0.3 mg/dL (ref 0.3–0.9)
Total Bilirubin: 0.5 mg/dL (ref 0.3–1.2)
Total Protein: 6.3 g/dL — ABNORMAL LOW (ref 6.5–8.1)

## 2018-04-16 LAB — BRAIN NATRIURETIC PEPTIDE: B Natriuretic Peptide: 2442 pg/mL — ABNORMAL HIGH (ref 0.0–100.0)

## 2018-04-16 MED ORDER — ALPRAZOLAM 0.25 MG PO TABS: 0.2500 mg | ORAL_TABLET | Freq: Every evening | ORAL | 0 refills | Status: AC | PRN

## 2018-04-16 MED ORDER — FUROSEMIDE 10 MG/ML IJ SOLN
80.0000 mg | Freq: Once | INTRAMUSCULAR | Status: AC
  Administered 2018-04-16: 80 mg via INTRAVENOUS
  Filled 2018-04-16 (×2): qty 8

## 2018-04-16 MED ORDER — METOLAZONE 2.5 MG PO TABS: 2.5000 mg | ORAL_TABLET | ORAL | 3 refills | Status: DC

## 2018-04-16 NOTE — ED Triage Notes (Signed)
Pt sent from a doctors office with a high crea  She rfeports that she was given a drug iv that was supposed to make her void but it did not.  Chest pain at present

## 2018-04-16 NOTE — Telephone Encounter (Signed)
Called and spoke w/pt and also called and spoke w/her daughter regarding lab work done today.  Per [name], NP pt needs to report to ER due to creatinine up to 6.  They are both aware and agreeable.

## 2018-04-16 NOTE — Progress Notes (Addendum)
Advanced Heart Failure Clinic Note   PCP: Dr. Dorita Fray ([place]) HF Cardiology: Dr. Aundra Dubin  HPI: Jocelyn Hill. Stipe is a 67 y.o. female with h/o mitral regurgitation, chronic diastolic CHF, CAD s/p MI, long standing HTN, DM2, CKD IV-V, h/o Stroke. Anxiety, depression and GERD. Patient reported that she has had 2 MIs treated at [facility] in [place] several years ago.   Presented to [facility] on 03/12/2017 with acute hypoxic respiratory failure. ECHO showed normal LV function with severe MR. Diuresed with IV lasix over 20 pounds. She was being considered for MVR by Dr Roxy Manns but due to multiple complications she was not a candidate. Hospital course was complicated by acute respiratory failure, acute renal failure, atrial fibrillation, retroperitoneal bleed, r hydronephrosis (perc tube) and E coli bacteremia. She was started on anticoagulation for atrial fibrillation but this was stopped with RP hematoma.  She converted to NSR on amiodarone but developed junctional rhythm so amiodarone was stopped. Creatinine peaked at 5.  She did not require dialysis and gradually renal function improved. Discharged to SNF without diuretics. Discharge weight was 124 pounds.   Admitted again 04/12/17-04/23/17 with respiratory failure from [facility]. She had gained 16 pounds at SNF as she was not on diuretic.  CXR with bilateral infiltrates. Placed on vanc and zosyn. Pertinent admission labs included BNP > 3000, creatinine 3.75, K 4.1, WBC 16.1, procalcitonin 2.06, and troponin 0.04. Requiring 100% high flow oxygen. Blood CX - NGTD. Diuresing with high dose IV lasix.  Nephrology consulted, renal US with no evidence of hydronephrosis (pt had percutaneous nephrostomy drain the month prior due to this). Also placed on NTG drip for HTN and CP. She was seen by Dr. Roxy Manns again and felt still to not be a MVR candidate. Discharged on Lasix 160 mg BID. Discharge weight was 114 pounds.   TTE 03/12/17 LVEF 60-65%,  Grade 2 DD, Severe MR, Severe LAE, Mild RAE, Mild/Mod TR, PA peak pressure 60 mm hg, small/mod pericardial effusion.  TEE 03/17/17 LVEF 55-60%, Trivial TR, Severe MR, mild LAE, mild mod TR. No evidence of vegetation.  LHC/RHC in 1/19 showed 90% ostial LCx, 60% mid LAD.   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   She was admitted in 8/19 for Bioprosthetic MVR, Maze, LA appendage clip, PFO closure, and CABG with LIMA-LAD and SVG-LCx.  This was complicated by AKI and complete heart block.  St Jude CRT-P device placed.  Echo in 8/19 post-op showed EF 45-50%, stable bioprosthetic mitral valve.    She returns today for 2 week HF follow up. Last visit torsemide and coreg were increased. She is doing poorly today. She is up 10 lbs in 2 weeks. She is occasionally SOB at rest and is SOB with any exertion. UOP did not increase with increased torsemide. She has ongoing CP, but seems more frequent. Improves with nitro. Lasts about 30 minutes. Not always associated with exertion. She had BLE edema R>L. She is dizzy on and off, not specific to any position. + bloating. + orthopnea. She vomited 2 days ago, but has had no fever or diarrhea. Drinking 1 gallon of fluid/day per daughter. Taking all medications.   ECG (personally reviewed): NSR, BiV paced  Labs (10/18): K 3.8, creatinine 2.88 Labs (12/18): LDL 64, HDL 66 Labs (3/19): hgb 11.4 Labs (4/19): K 3.9, creatinine 3.97 Labs (7/19): K 4.2, creatinine 3.6 Labs (9/19): K 4.8, creatinine 4.29  Review of systems complete and found to be negative unless listed in HPI.   PMH: 1. Chronic diastolic CHF:  - TTE 12/01/52 LVEF 60-65%, Grade 2 DD, Severe MR, Severe LAE, Mild RAE, Mild/Mod TR, PA peak pressure 60 mm hg, small/mod pericardial effusion. - RHC (1/19): mean RA 2, PA 37/12 mean 26, mean PCWP 13, CI 2.98 - Echo (8/19) with EF 45-50%, bioprosthetic MV stable.  -  St Jude CRT-P for complete heart block.   2. CKD: stage IV.  3. Anxiety/panic attacks 4. DM 5. HTN 6. CAD: Patient reports prior MI treated at [facility].  No records available - LHC in 1/19 showed 90% ostial LCx, 60% mid LAD.  - CABG 8/19 with LIMA-LAD and SVG-LCx.  7. Retroperitoneal hematoma: With anticoagulation in 9/18.  8. Hydronephrosis due to RP hematoma: Resolved.  9. GERD 10. Hyperlipidemia 11. H/o CVA 12. 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  - TEE 03/17/17 LVEF 55-60%, Trivial TR, Severe MR, mild LAE, mild mod TR. No evidence of vegetation. 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  - Bioprosthetic MV replacement in 8/19.  13. Atrial fibrillation: Paroxysmal.  Noted in 9/18, not anticoagulated currently due to RP hematoma.  - Junctional rhythm on amiodarone, amiodarone stopped. - Maze 8/19.  14. PFO: s/p closure 8/19.  15. Complete heart block post-op 8/19: Now has St Jude CRT-P device.    Current Outpatient Medications  Medication Sig Dispense Refill  . acetaminophen (TYLENOL) 325 MG tablet Take 2 tablets (650 mg total) by mouth every 6 (six) hours as needed for mild pain, fever or headache. 30 tablet 0  . albuterol (PROVENTIL HFA;VENTOLIN HFA) 108 (90 Base) MCG/ACT inhaler Inhale 2 puffs into the lungs every 6 (six) hours as needed for wheezing or shortness of breath.    . ALPRAZolam (XANAX) 0.25 MG tablet Take 1 tablet (0.25 mg total) by mouth at bedtime as needed for anxiety. 20 tablet 0  . amLODipine (NORVASC) 10 MG tablet Take 1 tablet (10 mg total) by mouth daily. 30 tablet 0  . aspirin EC 81 MG EC tablet Take 1 tablet (81 mg total) by mouth daily. 30 tablet 0  . atorvastatin (LIPITOR) 80 MG tablet Take 1 tablet (80 mg total) by mouth daily. 30 tablet 3  . b complex vitamins tablet Take 1 tablet by mouth daily.     . bisacodyl (DULCOLAX) 5 MG EC tablet Take 5 mg by mouth daily as needed for mild constipation or moderate constipation.    Marland Kitchen buPROPion (WELLBUTRIN) 100 MG tablet Take 100 mg by mouth 3 (three) times daily.    . busPIRone (BUSPAR) 15 MG tablet Take 1 tablet (15 mg total) by mouth 2 (two) times daily. 60 tablet 0  . calcium carbonate (TUMS EX) 750 MG chewable tablet Chew 2 tablets by mouth as needed for heartburn.    . carvedilol (COREG) 6.25 MG tablet Take 6.25 mg by mouth 2 (two) times daily.    . cholecalciferol (VITAMIN D) 1000 units tablet Take 1,000 Units by mouth daily.    . diphenhydrAMINE (BENADRYL) 25 MG tablet Take 25 mg by mouth as needed for allergies.    Marland Kitchen doxazosin (CARDURA) 8 MG tablet Take 8 mg (1 tab) in am and 4 mg (1/2 tab) in pm 45 tablet 11  . DULoxetine (CYMBALTA) 30 MG capsule Take 1 capsule (30  mg total) by mouth daily. 30 capsule 0  . gabapentin (NEURONTIN) 300 MG capsule Take 300 mg by mouth at bedtime.  11  . hydrALAZINE (APRESOLINE) 100 MG tablet Take 1 tablet (100 mg total) by mouth every 8 (eight) hours. 90 tablet 3  . isosorbide mononitrate (IMDUR) 120 MG 24 hr tablet Take 1 tablet (120 mg total) by mouth daily. 30 tablet 0  . levothyroxine (SYNTHROID, LEVOTHROID) 150 MCG tablet Take 150 mcg by mouth daily before breakfast.    . Liniments (BEN GAY EX) Apply 1 application topically as needed (back pain).    . Menthol-Methyl Salicylate (BENGAY ARTHRITIS FORMULA EX) Apply topically.    . montelukast (SINGULAIR) 10 MG tablet Take 1 tablet (10 mg total) by mouth daily. 30 tablet 0  . Nutritional Supplements (CARNATION BREAKFAST ESSENTIALS PO) Take 237 mLs by mouth 2 (two) times daily.    . ondansetron (ZOFRAN) 4 MG tablet Take 1 tablet (4 mg total) by mouth every 8 (eight) hours as needed for nausea or vomiting. 30 tablet 0  . oxyCODONE (OXY IR/ROXICODONE) 5 MG immediate release tablet Take 1 tablet (5 mg total) by mouth every 4 (four) hours as needed for severe pain.  30 tablet 0  . pantoprazole (PROTONIX) 40 MG tablet Take 1 tablet (40 mg total) by mouth 2 (two) times daily before a meal. 60 tablet 0  . potassium chloride SA (K-DUR,KLOR-CON) 20 MEQ tablet Take 20 mEq by mouth daily.    . sodium-potassium bicarbonate (ALKA-SELTZER GOLD) TBEF dissolvable tablet Take 4 tablets by mouth daily as needed (heartburn).    . torsemide (DEMADEX) 20 MG tablet Take 100 mg in am and 60 mg in pm    . traZODone (DESYREL) 25 mg TABS tablet Take 25 mg by mouth at bedtime.    Marland Kitchen warfarin (COUMADIN) 2.5 MG tablet Take 1 tablet (2.5 mg total) by mouth daily at 6 PM. 30 tablet 3   No current facility-administered medications for this encounter.     Allergies  Allergen Reactions  . Ace Inhibitors Anaphylaxis and Swelling  . Motrin Ib [Ibuprofen] Anaphylaxis      Social History   Socioeconomic History  . Marital status: Divorced    Spouse name: Not on file  . Number of children: Not on file  . Years of education: Not on file  . Highest education level: Not on file  Occupational History  . Not on file  Social Needs  . Financial resource strain: Not on file  . Food insecurity:    Worry: Not on file    Inability: Not on file  . Transportation needs:    Medical: Not on file    Non-medical: Not on file  Tobacco Use  . Smoking status: Former Smoker    Types: Cigarettes  . Smokeless tobacco: Never Used  Substance and Sexual Activity  . Alcohol use: No  . Drug use: No    Comment: marijuana in the past  . Sexual activity: Not Currently  Lifestyle  . Physical activity:    Days per week: Not on file    Minutes per session: Not on file  . Stress: Not on file  Relationships  . Social connections:    Talks on phone: Not on file    Gets together: Not on file    Attends religious service: Not on file    Active member of club or organization: Not on file    Attends meetings of clubs or organizations: Not on file  Relationship status: Not on file  . Intimate  partner violence:    Fear of current or ex partner: Not on file    Emotionally abused: Not on file    Physically abused: Not on file    Forced sexual activity: Not on file  Other Topics Concern  . Not on file  Social History Narrative  . Not on file      Family History  Problem Relation Age of Onset  . Hypertension Mother     Vitals:   04/16/18 1227  BP: (!) 168/98  Pulse: 79  SpO2: 96%  Weight: 65.2 kg (143 lb 12.8 oz)   Wt Readings from Last 3 Encounters:  04/16/18 65.2 kg (143 lb 12.8 oz)  04/02/18 60.5 kg (133 lb 6 oz)  03/16/18 59 kg (130 lb)   PHYSICAL EXAM: General: SOB with talking. Thin. Walked in with rollator. HEENT: Normal Neck: Supple. JVP to jaw. Carotids 2+ bilat; no bruits. No thyromegaly or nodule noted. Cor: PMI nondisplaced. RRR, No M/G/R noted Lungs: crackles throughout.  Abdomen: Soft, non-tender, ++distended, no HSM. No bruits or masses. +BS  Extremities: No cyanosis, clubbing, or rash. RLE 2+ edema, LLE 1+ edema (right typically worse than left) Neuro: Alert & orientedx3, cranial nerves grossly intact. moves all 4 extremities w/o difficulty. Affect pleasant  EKG: A-sensed, BiV paced 79 bpm. Unchanged from previous.   ASSESSMENT & PLAN: 1. Chronic primarily diastolic CHF: Echo (1/76, post-op) with EF 45-50%.  Has St Jude CRT-P device due to post-op complete heart block. On exam, she is markedly volume overloaded.  NYHA class IV symptoms. Significant post-op deconditioning.  - Give 80 mg IV lasix x1. She should take metolazone 2.5 mg tomorrow and Monday. She should then start taking metolazone 2x/week (on Monday and Thursdays). BMET, BNP STAT. - Continue torsemide 100 qam/60 qpm.   - Continue Coreg 6.25 mg bid  - Continue hydralazine/Imdur.  - Discussed the importance of limiting fluid intake as much as possible in setting of volume overload with CKD IV 2. CKD Stage IV:  Had R hydronephrosis in setting of hydronephrosis and required percutaneous  drain in 9/18 (removed). Renal US 04/14/17 showed no further evidence of hydronephrosis.   - Creatinine 4.3 on 10/4. Recheck BMET with increase in torsemide.  Close followup with nephrology.    3. HTN: Elevated today with volume overload. - Continue hydralazine 100 mg tid.   - Continue amlodipine 10 mg daily.  - Continue Coreg 6.25 mg bid - Continue doxazosin 8 qam/4 qpm.    - She needs to follow up with nephrology ASAP. I think she is nearing the need for dialysis. 4.  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  Had bioprosthetic MVR in 8/19.  Post-op echo in 8/19 showed stable bioprosthetic mitral valve.  5. Atrial fibrillation: Paroxysmal.  Now s/p Maze in 8/19. Regular on exam today.  - Continue warfarin. Denies bleeding. 6. CAD:  LHC in 1/19 showed severe ostial LCx disease and moderate LAD disease. She had LIMA-LAD and SVG-OM in 8/19.  - Continue ASA 81 and atorvastatin 80 mg daily. Good lipids 03/2018 - Continue Imdur 120 mg daily and Coreg 6.25 mg bid.  - Having CP on and off. Not necessarily with activity per patient. Lasts about 30 minutes. EKG looks unchanged. Likely secondary to volume overload. Discussed with Dr Aundra Dubin. 7. CSW in touch with her about possible SNF placement when daughter has to go home.    Will give 80 mg IV lasix x1 in  clinic and start on scheduled metolazone as above. STAT BMET and BNP She needs to see her nephrologist ASAP.  If she does not improve with IV lasix and metolazone, she will need to go to the ED and will likely need HD.  Discussed all of the above with Dr Aundra Dubin. Follow up in 1 week.   Georgiana Shore, NP 04/16/18   Greater than 50% of the 35 minute visit was spent in counseling/coordination of care regarding disease state education, salt/fluid restriction, sliding scale diuretics, and medication compliance.  Addendum: She received 80 mg IV lasix around 13:30. She voided 250 mls light yellow UOP. She had to leave by 2 pm. STAT labs have not yet  resulted. We will call her if labs are abnormal.

## 2018-04-16 NOTE — Progress Notes (Signed)
80 mg IV Lasix administered to left hand IV. Patient tolerated well and await her to begin to urinate before discharge.

## 2018-04-16 NOTE — ED Notes (Addendum)
Troponin of 0.07 received.  Will continue to prioritize rooming

## 2018-04-16 NOTE — Patient Instructions (Addendum)
TAKE Metolazone 2.5 mg, one tab tomorrow 10/19 and Monday 10/21, then continue one tab twice weekly (Monday and Thursday)  Labs today We will only contact you if something comes back abnormal or we need to make some changes. Otherwise no news is good news!  Your physician recommends that you schedule a follow-up appointment in: 1 week  in the Advanced Practitioners (PA/NP) Clinic    Be sure to schedule a follow up with Dr Hollie Salk ASAP  [phone]  Do the following things EVERYDAY: 1) Weigh yourself in the morning before breakfast. Write it down and keep it in a log. 2) Take your medicines as prescribed 3) Eat low salt foods-Limit salt (sodium) to 2000 mg per day.  4) Stay as active as you can everyday 5) Limit all fluids for the day to less than 2 liters

## 2018-04-17 ENCOUNTER — Other Ambulatory Visit: Payer: Self-pay

## 2018-04-17 ENCOUNTER — Encounter (HOSPITAL_COMMUNITY): Payer: Self-pay | Admitting: Nephrology

## 2018-04-17 DIAGNOSIS — I5033 Acute on chronic diastolic (congestive) heart failure: Secondary | ICD-10-CM

## 2018-04-17 DIAGNOSIS — N2581 Secondary hyperparathyroidism of renal origin: Secondary | ICD-10-CM | POA: Diagnosis present

## 2018-04-17 DIAGNOSIS — I480 Paroxysmal atrial fibrillation: Secondary | ICD-10-CM | POA: Diagnosis present

## 2018-04-17 DIAGNOSIS — E039 Hypothyroidism, unspecified: Secondary | ICD-10-CM | POA: Diagnosis present

## 2018-04-17 DIAGNOSIS — I5023 Acute on chronic systolic (congestive) heart failure: Secondary | ICD-10-CM | POA: Diagnosis not present

## 2018-04-17 DIAGNOSIS — E1122 Type 2 diabetes mellitus with diabetic chronic kidney disease: Secondary | ICD-10-CM | POA: Diagnosis present

## 2018-04-17 DIAGNOSIS — N189 Chronic kidney disease, unspecified: Secondary | ICD-10-CM | POA: Diagnosis not present

## 2018-04-17 DIAGNOSIS — M79602 Pain in left arm: Secondary | ICD-10-CM | POA: Diagnosis not present

## 2018-04-17 DIAGNOSIS — T82898D Other specified complication of vascular prosthetic devices, implants and grafts, subsequent encounter: Secondary | ICD-10-CM | POA: Diagnosis not present

## 2018-04-17 DIAGNOSIS — I509 Heart failure, unspecified: Secondary | ICD-10-CM

## 2018-04-17 DIAGNOSIS — N186 End stage renal disease: Secondary | ICD-10-CM

## 2018-04-17 DIAGNOSIS — I132 Hypertensive heart and chronic kidney disease with heart failure and with stage 5 chronic kidney disease, or end stage renal disease: Secondary | ICD-10-CM | POA: Diagnosis present

## 2018-04-17 DIAGNOSIS — I472 Ventricular tachycardia: Secondary | ICD-10-CM | POA: Diagnosis not present

## 2018-04-17 DIAGNOSIS — N179 Acute kidney failure, unspecified: Secondary | ICD-10-CM

## 2018-04-17 DIAGNOSIS — I5030 Unspecified diastolic (congestive) heart failure: Secondary | ICD-10-CM | POA: Diagnosis not present

## 2018-04-17 DIAGNOSIS — D631 Anemia in chronic kidney disease: Secondary | ICD-10-CM | POA: Diagnosis present

## 2018-04-17 DIAGNOSIS — R7989 Other specified abnormal findings of blood chemistry: Secondary | ICD-10-CM

## 2018-04-17 DIAGNOSIS — I5043 Acute on chronic combined systolic (congestive) and diastolic (congestive) heart failure: Secondary | ICD-10-CM | POA: Diagnosis present

## 2018-04-17 DIAGNOSIS — Z950 Presence of cardiac pacemaker: Secondary | ICD-10-CM | POA: Diagnosis not present

## 2018-04-17 DIAGNOSIS — Z952 Presence of prosthetic heart valve: Secondary | ICD-10-CM | POA: Diagnosis not present

## 2018-04-17 DIAGNOSIS — T82848A Pain from vascular prosthetic devices, implants and grafts, initial encounter: Secondary | ICD-10-CM | POA: Diagnosis not present

## 2018-04-17 DIAGNOSIS — I10 Essential (primary) hypertension: Secondary | ICD-10-CM

## 2018-04-17 DIAGNOSIS — N185 Chronic kidney disease, stage 5: Secondary | ICD-10-CM | POA: Diagnosis not present

## 2018-04-17 DIAGNOSIS — Z992 Dependence on renal dialysis: Secondary | ICD-10-CM

## 2018-04-17 DIAGNOSIS — D509 Iron deficiency anemia, unspecified: Secondary | ICD-10-CM | POA: Diagnosis present

## 2018-04-17 DIAGNOSIS — J90 Pleural effusion, not elsewhere classified: Secondary | ICD-10-CM | POA: Diagnosis not present

## 2018-04-17 DIAGNOSIS — I248 Other forms of acute ischemic heart disease: Secondary | ICD-10-CM | POA: Diagnosis present

## 2018-04-17 DIAGNOSIS — D638 Anemia in other chronic diseases classified elsewhere: Secondary | ICD-10-CM | POA: Diagnosis not present

## 2018-04-17 DIAGNOSIS — Z5181 Encounter for therapeutic drug level monitoring: Secondary | ICD-10-CM | POA: Diagnosis not present

## 2018-04-17 DIAGNOSIS — I252 Old myocardial infarction: Secondary | ICD-10-CM | POA: Diagnosis not present

## 2018-04-17 DIAGNOSIS — Y838 Other surgical procedures as the cause of abnormal reaction of the patient, or of later complication, without mention of misadventure at the time of the procedure: Secondary | ICD-10-CM | POA: Diagnosis not present

## 2018-04-17 DIAGNOSIS — K219 Gastro-esophageal reflux disease without esophagitis: Secondary | ICD-10-CM | POA: Diagnosis present

## 2018-04-17 DIAGNOSIS — I442 Atrioventricular block, complete: Secondary | ICD-10-CM | POA: Diagnosis present

## 2018-04-17 DIAGNOSIS — N184 Chronic kidney disease, stage 4 (severe): Secondary | ICD-10-CM | POA: Diagnosis not present

## 2018-04-17 DIAGNOSIS — R7889 Finding of other specified substances, not normally found in blood: Secondary | ICD-10-CM | POA: Diagnosis not present

## 2018-04-17 DIAGNOSIS — F419 Anxiety disorder, unspecified: Secondary | ICD-10-CM | POA: Diagnosis present

## 2018-04-17 DIAGNOSIS — E1121 Type 2 diabetes mellitus with diabetic nephropathy: Secondary | ICD-10-CM | POA: Diagnosis present

## 2018-04-17 DIAGNOSIS — Z7901 Long term (current) use of anticoagulants: Secondary | ICD-10-CM | POA: Diagnosis not present

## 2018-04-17 DIAGNOSIS — F329 Major depressive disorder, single episode, unspecified: Secondary | ICD-10-CM | POA: Diagnosis present

## 2018-04-17 DIAGNOSIS — F411 Generalized anxiety disorder: Secondary | ICD-10-CM | POA: Diagnosis not present

## 2018-04-17 DIAGNOSIS — Z953 Presence of xenogenic heart valve: Secondary | ICD-10-CM | POA: Diagnosis not present

## 2018-04-17 DIAGNOSIS — I2510 Atherosclerotic heart disease of native coronary artery without angina pectoris: Secondary | ICD-10-CM | POA: Diagnosis present

## 2018-04-17 DIAGNOSIS — Z951 Presence of aortocoronary bypass graft: Secondary | ICD-10-CM | POA: Diagnosis not present

## 2018-04-17 DIAGNOSIS — E440 Moderate protein-calorie malnutrition: Secondary | ICD-10-CM | POA: Diagnosis present

## 2018-04-17 LAB — TROPONIN I
Troponin I: 0.06 ng/mL (ref <0.03)
Troponin I: 0.07 ng/mL (ref <0.03)
Troponin I: 0.07 ng/mL (ref <0.03)
Troponin I: 0.07 ng/mL (ref <0.03)

## 2018-04-17 LAB — PROTIME-INR
INR: 1.89
Prothrombin Time: 21.5 seconds — ABNORMAL HIGH (ref 11.4–15.2)

## 2018-04-17 LAB — FERRITIN: Ferritin: 98 ng/mL (ref 11–307)

## 2018-04-17 LAB — TSH: TSH: 2.745 u[IU]/mL (ref 0.350–4.500)

## 2018-04-17 MED ORDER — BUPROPION HCL 100 MG PO TABS
100.0000 mg | ORAL_TABLET | Freq: Three times a day (TID) | ORAL | Status: DC
  Administered 2018-04-17 – 2018-05-01 (×34): 100 mg via ORAL
  Filled 2018-04-17 (×45): qty 1

## 2018-04-17 MED ORDER — HEPARIN (PORCINE) IN NACL 100-0.45 UNIT/ML-% IJ SOLN
1150.0000 [IU]/h | INTRAMUSCULAR | Status: DC
  Administered 2018-04-17: 900 [IU]/h via INTRAVENOUS
  Administered 2018-04-18 – 2018-04-19 (×2): 1100 [IU]/h via INTRAVENOUS
  Administered 2018-04-20 – 2018-04-22 (×3): 1150 [IU]/h via INTRAVENOUS
  Filled 2018-04-17 (×6): qty 250

## 2018-04-17 MED ORDER — ACETAMINOPHEN 325 MG PO TABS
650.0000 mg | ORAL_TABLET | Freq: Four times a day (QID) | ORAL | Status: DC | PRN
  Administered 2018-04-17 – 2018-04-26 (×11): 650 mg via ORAL
  Filled 2018-04-17 (×11): qty 2

## 2018-04-17 MED ORDER — ISOSORBIDE MONONITRATE ER 60 MG PO TB24
120.0000 mg | ORAL_TABLET | Freq: Every day | ORAL | Status: DC
  Administered 2018-04-17 – 2018-05-01 (×14): 120 mg via ORAL
  Filled 2018-04-17 (×14): qty 2
  Filled 2018-04-17: qty 4

## 2018-04-17 MED ORDER — ALBUTEROL SULFATE (2.5 MG/3ML) 0.083% IN NEBU: 2.5000 mg | INHALATION_SOLUTION | RESPIRATORY_TRACT | Status: DC | PRN

## 2018-04-17 MED ORDER — LEVOTHYROXINE SODIUM 112 MCG PO TABS
112.0000 ug | ORAL_TABLET | Freq: Every day | ORAL | Status: DC
  Administered 2018-04-17 – 2018-05-01 (×14): 112 ug via ORAL
  Filled 2018-04-17 (×14): qty 1

## 2018-04-17 MED ORDER — HYDRALAZINE HCL 25 MG PO TABS
100.0000 mg | ORAL_TABLET | Freq: Three times a day (TID) | ORAL | Status: DC
  Administered 2018-04-17 – 2018-04-29 (×34): 100 mg via ORAL
  Filled 2018-04-17 (×36): qty 4

## 2018-04-17 MED ORDER — DOXAZOSIN MESYLATE 4 MG PO TABS: 4.0000 mg | ORAL_TABLET | ORAL | Status: DC

## 2018-04-17 MED ORDER — TRAZODONE HCL 50 MG PO TABS
25.0000 mg | ORAL_TABLET | Freq: Every day | ORAL | Status: DC
  Administered 2018-04-17 – 2018-04-30 (×14): 25 mg via ORAL
  Filled 2018-04-17 (×14): qty 1

## 2018-04-17 MED ORDER — FUROSEMIDE 10 MG/ML IJ SOLN
120.0000 mg | Freq: Three times a day (TID) | INTRAVENOUS | Status: DC
  Administered 2018-04-17 – 2018-04-19 (×4): 120 mg via INTRAVENOUS
  Filled 2018-04-17 (×2): qty 12
  Filled 2018-04-17: qty 10
  Filled 2018-04-17 (×3): qty 12
  Filled 2018-04-17: qty 10

## 2018-04-17 MED ORDER — WARFARIN - PHYSICIAN DOSING INPATIENT: Freq: Every day | Status: DC

## 2018-04-17 MED ORDER — ATORVASTATIN CALCIUM 80 MG PO TABS
80.0000 mg | ORAL_TABLET | Freq: Every day | ORAL | Status: DC
  Administered 2018-04-17 – 2018-05-01 (×14): 80 mg via ORAL
  Filled 2018-04-17 (×7): qty 1
  Filled 2018-04-17: qty 4
  Filled 2018-04-17 (×6): qty 1

## 2018-04-17 MED ORDER — CARVEDILOL 6.25 MG PO TABS
6.2500 mg | ORAL_TABLET | Freq: Two times a day (BID) | ORAL | Status: DC
  Administered 2018-04-17 – 2018-04-19 (×6): 6.25 mg via ORAL
  Filled 2018-04-17 (×6): qty 1

## 2018-04-17 MED ORDER — BUSPIRONE HCL 5 MG PO TABS
15.0000 mg | ORAL_TABLET | Freq: Two times a day (BID) | ORAL | Status: DC
  Administered 2018-04-17 – 2018-05-01 (×29): 15 mg via ORAL
  Filled 2018-04-17 (×11): qty 3
  Filled 2018-04-17: qty 2
  Filled 2018-04-17 (×6): qty 3
  Filled 2018-04-17: qty 2
  Filled 2018-04-17 (×9): qty 3

## 2018-04-17 MED ORDER — DOXAZOSIN MESYLATE 4 MG PO TABS
4.0000 mg | ORAL_TABLET | Freq: Every day | ORAL | Status: DC
  Administered 2018-04-17 – 2018-04-24 (×8): 4 mg via ORAL
  Filled 2018-04-17 (×8): qty 1

## 2018-04-17 MED ORDER — MONTELUKAST SODIUM 10 MG PO TABS
10.0000 mg | ORAL_TABLET | Freq: Every day | ORAL | Status: DC
  Administered 2018-04-17 – 2018-05-01 (×14): 10 mg via ORAL
  Filled 2018-04-17 (×15): qty 1

## 2018-04-17 MED ORDER — PANTOPRAZOLE SODIUM 40 MG PO TBEC
40.0000 mg | DELAYED_RELEASE_TABLET | Freq: Two times a day (BID) | ORAL | Status: DC
  Administered 2018-04-17 – 2018-05-01 (×26): 40 mg via ORAL
  Filled 2018-04-17 (×26): qty 1

## 2018-04-17 MED ORDER — ASPIRIN EC 81 MG PO TBEC
81.0000 mg | DELAYED_RELEASE_TABLET | Freq: Every day | ORAL | Status: DC
  Administered 2018-04-17 – 2018-05-01 (×14): 81 mg via ORAL
  Filled 2018-04-17 (×14): qty 1

## 2018-04-17 MED ORDER — B COMPLEX-C PO TABS
1.0000 | ORAL_TABLET | Freq: Every day | ORAL | Status: DC
  Administered 2018-04-18 – 2018-05-01 (×13): 1 via ORAL
  Filled 2018-04-17 (×15): qty 1

## 2018-04-17 MED ORDER — VITAMIN D 1000 UNITS PO TABS
1000.0000 [IU] | ORAL_TABLET | Freq: Every day | ORAL | Status: DC
  Administered 2018-04-17 – 2018-04-22 (×6): 1000 [IU] via ORAL
  Filled 2018-04-17 (×6): qty 1

## 2018-04-17 MED ORDER — WARFARIN SODIUM 2.5 MG PO TABS
2.5000 mg | ORAL_TABLET | Freq: Every day | ORAL | Status: DC
  Filled 2018-04-17: qty 1

## 2018-04-17 MED ORDER — DOXAZOSIN MESYLATE 8 MG PO TABS
8.0000 mg | ORAL_TABLET | Freq: Every day | ORAL | Status: DC
  Administered 2018-04-18 – 2018-04-25 (×7): 8 mg via ORAL
  Filled 2018-04-17 (×9): qty 1

## 2018-04-17 MED ORDER — OXYCODONE HCL 5 MG PO TABS
5.0000 mg | ORAL_TABLET | ORAL | Status: DC | PRN
  Administered 2018-04-17 – 2018-04-22 (×16): 5 mg via ORAL
  Filled 2018-04-17 (×16): qty 1

## 2018-04-17 MED ORDER — CARNATION BREAKFAST ESSENTIALS PO LIQD: Freq: Two times a day (BID) | ORAL | Status: DC

## 2018-04-17 MED ORDER — DULOXETINE HCL 30 MG PO CPEP
30.0000 mg | ORAL_CAPSULE | Freq: Every day | ORAL | Status: DC
  Administered 2018-04-17 – 2018-04-21 (×5): 30 mg via ORAL
  Filled 2018-04-17 (×6): qty 1

## 2018-04-17 NOTE — Progress Notes (Signed)
ANTICOAGULATION CONSULT NOTE - Initial Consult  Pharmacy Consult for heparin Indication: atrial fibrillation  Allergies  Allergen Reactions  . Ace Inhibitors Anaphylaxis and Swelling  . Motrin Ib [Ibuprofen] Anaphylaxis    Patient Measurements: Weight: 143 lb 11.8 oz (65.2 kg) Heparin Dosing Weight: 65.2 kg   Vital Signs: BP: 160/93 (10/19 1300) Pulse Rate: 101 (10/19 1600)  Labs: Recent Labs    04/16/18 1303 04/16/18 1805  04/17/18 0320 04/17/18 0942 04/17/18 1416  HGB  --  8.7*  --   --   --   --   HCT  --  26.9*  --   --   --   --   PLT  --  281  --   --   --   --   LABPROT  --   --   --   --  21.5*  --   INR  --   --   --   --  1.89  --   CREATININE 6.00* 6.03*  --   --   --   --   TROPONINI  --  0.07*   < > 0.07* 0.07* 0.06*   < > = values in this interval not displayed.    Estimated Creatinine Clearance: 9.4 mL/min (A) (by C-G formula based on SCr of 6.03 mg/dL (H)).   Medical History: Past Medical History:  Diagnosis Date  . Anemia    low iron  . Anxiety   . Arthritis   . Bronchitis   . CHF (congestive heart failure) ([place])   . CKD (chronic kidney disease) stage 4, GFR 15-29 ml/min (HCC) 03/16/2017  . Coronary artery disease   . Depression   . Diabetes mellitus without complication ([place])   . Diet-controlled diabetes mellitus ([place])   . Ganglion cyst    left wrist  . GERD (gastroesophageal reflux disease)   . Headache    chronic headaches  . Heart failure ([place])   . Hyperlipidemia   . Hypertension   . Hypothyroidism   . Mitral regurgitation   . Myocardial infarction ([place])    3x last one 2008  . PAF (paroxysmal atrial fibrillation) ([place])   . Pneumonia    x 2  . S/P Maze operation for atrial fibrillation 02/18/2018   Complete bilateral atrial lesion set using bipolar radiofrequency and cryothermy ablation with clipping of LA appendage  . S/P mitral valve replacement with bioprosthetic valve 02/18/2018   [facility] Mitral stented bovine  pericardial tissue valve Model 7300 TFX Serial # [id number] Size 29  . Stroke [facility])    no lasting residual - ? 2014  . Umbilical hernia     Medications:  Scheduled:  . aspirin EC  81 mg Oral Daily  . atorvastatin  80 mg Oral Daily  . B-complex with vitamin C  1 tablet Oral Daily  . buPROPion  100 mg Oral TID  . busPIRone  15 mg Oral BID  . carvedilol  6.25 mg Oral BID  . cholecalciferol  1,000 Units Oral Daily  . doxazosin  8 mg Oral Daily   And  . doxazosin  4 mg Oral QHS  . DULoxetine  30 mg Oral Daily  . hydrALAZINE  100 mg Oral Q8H  . isosorbide mononitrate  120 mg Oral Daily  . levothyroxine  112 mcg Oral QAC breakfast  . montelukast  10 mg Oral Daily  . pantoprazole  40 mg Oral BID AC  . traZODone  25 mg Oral QHS  Assessment: 31 yof on warfarin PTA for Afib s/p recent bioprosthetic MVR planning to undergo procedures. Pt reported home regimen as 2.5 mg daily (last dose 10/17 PM).   INR today 1.89. Plan to continue to hold warfarin so INR< 1.7 by Monday AM for [facility] by IR. Hgb 8.7, plt 281. No s/sx of bleeding.   Goal of Therapy:  INR 2-3 Heparin level 0.3-0.7 units/ml Monitor platelets by anticoagulation protocol: Yes   Plan:  Start heparin infusion at 900 units/hr Check anti-Xa level in 8 hours and daily while on heparin Continue to monitor H&H and platelets  Doylene Canard, PharmD Clinical Pharmacist  Pager: [phone] Phone: [phone] 04/17/2018,5:09 PM

## 2018-04-17 NOTE — Consult Note (Addendum)
Cardiology Consultation:   Patient ID: Jocelyn Hill MRN: [id number]; DOB: [birth year]  Admit date: 04/16/2018 Date of Consult: 04/17/2018  Primary Care Provider: [name], MD Primary Cardiologist: [name], MD  Primary Electrophysiologist:  Will Meredith Leeds, MD  Nephrologist: Dr. Hollie Salk  Patient Profile:   Jocelyn Hill is a 67 y.o. AA female with a hx of severe Mitral regurgitation, CAD, PAF s/p recent Bioprosthetic MVR, Maze, LA appendage clip, PFO closure, and CABG with LIMA-LAD and SVG-LCx 07/6710, complicated by AKI and CHB requiring CRT-P implantation, who is being seen today for the evaluation of acute on chronic combined systolic and diastolic HF, at the request of Dr. Nori Riis, [facility]. Pt also with worsening renal function.   History of Present Illness:   Jocelyn Hill is a 67 y.o. 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 Patient reported that she has had 2 MIs treated at [facility] in [place] several years ago. In August of this year, she underwent Bioprosthetic MVR, Maze, LA appendage clip, PFO closure, and CABG with LIMA-LAD and SVG-LCx.  This was complicated by AKI and complete heart block.  St Jude CRT-P device placed, followed by Dr. Curt Bears.  Echo in 8/19 post-op showed EF 45-50%, stable bioprosthetic mitral valve.   Pt is followed in the [facility] by Dr. Aundra Dubin.  She was seen 04/02/18 for post hospital f/u. She was noted to be volume overloaded and with NYHA class III symptoms. She was in NSR. She was instructed to increase torsemide to 100 mg qam/ 60 mg qpm. Coreg also changed to BID (pt was only taking once daily). Plan was for f/u BMP in 10 days and clinic f/u in 2 weeks. Of note, her SCr on 04/02/18 was 4.31, c/w baseline.  Her weight was 133 lb.   She returned to the [facility] for her 2 week f/u yesterday. She was  doing poorly. Weight had increased by 10 lb. SOB with exertion and rest. No change in UOP with increase in diuretic. Also with nitrate responsive CP. Reported full med compliance. Was noted to be in NSR. No ST changes. Pt was given dose of IV Lasix, 80 mg x 1 in clinic with plans to get a STAT BMP and BNP. She was instructed to f/u with her nephrologist ASAP. Pt was instructed to return to the ED if no improvement.   Pt presented to the ED overnight with worsening dyspnea and CP. SCr increased to 6.03. Poor response to IV Lasix. Pt notes very little UOP.    In the ED, she is currently fairly comfortable at rest with supplemental O2 via [place]. BNP elevated at 2,442. CXR shows stable small bilateral pleural effusions, pulmonary vascular congestion, and cardiomegaly. BMP notable for acute on chronic renal failure. SCr 6.03. BUN 59. GRF 8 mL/min. Also hyperglycemia with glucose of 229. CBC notable for anemia. Hgb 8.7. Baseline Hgb ~9. MCV 75 c/w microcytic anemia. Ferritin lab pending. TSH is WNL. Initial troponin mildly elevated at 0.07. Serial troponins pending. EKG shows SR w/ LBBB. NSR on tele. HR in the 70s. BP elevated at 161/88. Pt being admitted by [facility].   Past Medical History:  Diagnosis Date  . Anemia    low iron  . Anxiety   . Arthritis   . Bronchitis   . CHF (congestive heart failure) ([place])   . CKD (chronic kidney  disease) stage 4, GFR 15-29 ml/min (HCC) 03/16/2017  . Coronary artery disease   . Depression   . Diabetes mellitus without complication ([place])   . Diet-controlled diabetes mellitus ([place])   . Ganglion cyst    left wrist  . GERD (gastroesophageal reflux disease)   . Headache    chronic headaches  . Heart failure ([place])   . Hyperlipidemia   . Hypertension   . Hypothyroidism   . Mitral regurgitation   . Myocardial infarction ([place])    3x last one 2008  . PAF (paroxysmal atrial fibrillation) ([place])   . Pneumonia    x 2  . S/P Maze operation for atrial fibrillation  02/18/2018   Complete bilateral atrial lesion set using bipolar radiofrequency and cryothermy ablation with clipping of LA appendage  . S/P mitral valve replacement with bioprosthetic valve 02/18/2018   [facility] Mitral stented bovine pericardial tissue valve Model 7300 TFX Serial # [id number] Size 29  . Stroke [facility])    no lasting residual - ? 2014  . Umbilical hernia     Past Surgical History:  Procedure Laterality Date  . ABDOMINAL HYSTERECTOMY     PARTIALS  . BIV PACEMAKER INSERTION CRT-P N/A 02/26/2018   Procedure: BIV PACEMAKER INSERTION CRT-P;  Surgeon: Constance Haw, MD;  Location: [place] CV LAB;  Service: Cardiovascular;  Laterality: N/A;  . CARDIAC CATHETERIZATION    . CESAREAN SECTION    . CORONARY ARTERY BYPASS GRAFT N/A 02/18/2018   Procedure: CORONARY ARTERY BYPASS GRAFTING (CABG) WITH IMA. ENDOSCOPIC VEIN HARVEST. IMA TO LAD, SVG TO CIRC.;  Surgeon: Rexene Alberts, MD;  Location: [place];  Service: Open Heart Surgery;  Laterality: N/A;  . INSERTION OF DIALYSIS CATHETER N/A 02/18/2018   Procedure: PLACEMENT OF TEMPORARY HD CATHETER, POWER TRIALYSIS 13FR 20CM;  Surgeon: Rexene Alberts, MD;  Location: [place];  Service: Vascular;  Laterality: N/A;  . IR NEPHRO TUBE REMOV/FL  04/02/2017  . IR NEPHROSTOGRAM RIGHT THRU EXISTING ACCESS  04/02/2017  . IR NEPHROSTOMY PLACEMENT RIGHT  03/27/2017  . IR THORACENTESIS ASP PLEURAL SPACE W/IMG GUIDE  03/24/2017  . MAZE N/A 02/18/2018   Procedure: MAZE;  Surgeon: Rexene Alberts, MD;  Location: [place];  Service: Open Heart Surgery;  Laterality: N/A;  . MITRAL VALVE REPLACEMENT N/A 02/18/2018   Procedure: MITRAL VALVE (MV) REPLACEMENT;  Surgeon: Rexene Alberts, MD;  Location: [place];  Service: Open Heart Surgery;  Laterality: N/A;  . MULTIPLE EXTRACTIONS WITH ALVEOLOPLASTY N/A 11/05/2017   Procedure: Extraction of tooth #'s 4,5,7,8,9,10,12,14 and 29 with alveoloplasty and gross debridement of remaining teeth;  Surgeon: [name],  DDS;  Location: [place];  Service: Oral Surgery;  Laterality: N/A;  . PATENT FORAMEN OVALE(PFO) CLOSURE N/A 02/18/2018   Procedure: PATENT FORAMEN OVALE (PFO) CLOSURE;  Surgeon: Rexene Alberts, MD;  Location: [place];  Service: Open Heart Surgery;  Laterality: N/A;  . RIGHT/LEFT HEART CATH AND CORONARY ANGIOGRAPHY N/A 07/03/2017   Procedure: RIGHT/LEFT HEART CATH AND CORONARY ANGIOGRAPHY;  Surgeon: Larey Dresser, MD;  Location: [place] CV LAB;  Service: Cardiovascular;  Laterality: N/A;  . TEE WITHOUT CARDIOVERSION N/A 10/08/2017   Procedure: TRANSESOPHAGEAL ECHOCARDIOGRAM (TEE);  Surgeon: Larey Dresser, MD;  Location: [facility] ENDOSCOPY;  Service: Cardiovascular;  Laterality: N/A;  . TEE WITHOUT CARDIOVERSION N/A 02/18/2018   Procedure: TRANSESOPHAGEAL ECHOCARDIOGRAM (TEE);  Surgeon: Rexene Alberts, MD;  Location: [place];  Service: Open Heart Surgery;  Laterality: N/A;  .  TONSILLECTOMY    . TUBAL LIGATION       Home Medications:  Prior to Admission medications   Medication Sig Start Date End Date Taking? Authorizing Provider  acetaminophen (TYLENOL) 325 MG tablet Take 2 tablets (650 mg total) by mouth every 6 (six) hours as needed for mild pain, fever or headache. 04/23/17  Yes Allie Bossier, MD  albuterol (PROVENTIL HFA;VENTOLIN HFA) 108 (90 Base) MCG/ACT inhaler Inhale 2 puffs into the lungs every 6 (six) hours as needed for wheezing or shortness of breath.   Yes [provider]  ALPRAZolam (XANAX) 0.25 MG tablet Take 1 tablet (0.25 mg total) by mouth at bedtime as needed for anxiety. 04/16/18  Yes Georgiana Shore, NP  amLODipine (NORVASC) 10 MG tablet Take 1 tablet (10 mg total) by mouth daily. 04/24/17  Yes Allie Bossier, MD  aspirin EC 81 MG EC tablet Take 1 tablet (81 mg total) by mouth daily. 04/24/17  Yes Allie Bossier, MD  atorvastatin (LIPITOR) 80 MG tablet Take 1 tablet (80 mg total) by mouth daily. 03/06/18  Yes Barrett, Erin R, PA-C  b complex vitamins tablet Take 1  tablet by mouth daily.   Yes [provider]  buPROPion (WELLBUTRIN) 100 MG tablet Take 100 mg by mouth 3 (three) times daily.   Yes [provider]  busPIRone (BUSPAR) 15 MG tablet Take 1 tablet (15 mg total) by mouth 2 (two) times daily. 04/23/17  Yes Allie Bossier, MD  carvedilol (COREG) 6.25 MG tablet Take 6.25 mg by mouth 2 (two) times daily.   Yes [provider]  cholecalciferol (VITAMIN D) 1000 units tablet Take 1,000 Units by mouth daily.   Yes [provider]  doxazosin (CARDURA) 8 MG tablet Take 8 mg (1 tab) in am and 4 mg (1/2 tab) in pm Patient taking differently: Take 4-8 mg by mouth See admin instructions. Take 1 tablet every morning and take 1/2 tablet every evening 03/11/18  Yes Clegg, Amy D, NP  DULoxetine (CYMBALTA) 30 MG capsule Take 1 capsule (30 mg total) by mouth daily. 04/24/17  Yes Allie Bossier, MD  gabapentin (NEURONTIN) 300 MG capsule Take 300 mg by mouth at bedtime. 10/02/17  Yes [provider]  hydrALAZINE (APRESOLINE) 100 MG tablet Take 1 tablet (100 mg total) by mouth every 8 (eight) hours. 03/05/18  Yes Barrett, Erin R, PA-C  isosorbide mononitrate (IMDUR) 120 MG 24 hr tablet Take 1 tablet (120 mg total) by mouth daily. 04/24/17  Yes Allie Bossier, MD  levothyroxine (SYNTHROID, LEVOTHROID) 112 MCG tablet Take 112 mcg by mouth daily before breakfast.   Yes [provider]  montelukast (SINGULAIR) 10 MG tablet Take 1 tablet (10 mg total) by mouth daily. 04/24/17  Yes Allie Bossier, MD  Nutritional Supplements (CARNATION BREAKFAST ESSENTIALS PO) Take 237 mLs by mouth 2 (two) times daily.   Yes [provider]  oxyCODONE (OXY IR/ROXICODONE) 5 MG immediate release tablet Take 1 tablet (5 mg total) by mouth every 4 (four) hours as needed for severe pain. 03/05/18  Yes Barrett, Erin R, PA-C  pantoprazole (PROTONIX) 40 MG tablet Take 1 tablet (40 mg total) by mouth 2 (two) times daily before a meal. 04/23/17  Yes  Allie Bossier, MD  potassium chloride SA (K-DUR,KLOR-CON) 20 MEQ tablet Take 20 mEq by mouth daily.   Yes [provider]  torsemide (DEMADEX) 20 MG tablet Take 100 mg in am and 60 mg in pm Patient  taking differently: Take 60-100 mg by mouth See admin instructions. Take 5 tablets every morning then take 3 tablets every evening 04/02/18  Yes Larey Dresser, MD  traZODone (DESYREL) 25 mg TABS tablet Take 25 mg by mouth at bedtime.   Yes [provider]  warfarin (COUMADIN) 2.5 MG tablet Take 1 tablet (2.5 mg total) by mouth daily at 6 PM. 03/05/18  Yes Barrett, Erin R, PA-C  metolazone (ZAROXOLYN) 2.5 MG tablet Take 1 tablet (2.5 mg total) by mouth 2 (two) times a week. 04/19/18 07/18/18  Georgiana Shore, NP  ondansetron (ZOFRAN) 4 MG tablet Take 1 tablet (4 mg total) by mouth every 8 (eight) hours as needed for nausea or vomiting. Patient not taking: Reported on 04/17/2018 04/23/17 04/23/18  Allie Bossier, MD    Inpatient Medications: Scheduled Meds: . aspirin EC  81 mg Oral Daily  . atorvastatin  80 mg Oral Daily  . b complex vitamins  1 tablet Oral Daily  . buPROPion  100 mg Oral TID  . busPIRone  15 mg Oral BID  . carvedilol  6.25 mg Oral BID  . cholecalciferol  1,000 Units Oral Daily  . doxazosin  4 mg Oral See admin instructions  . DULoxetine  30 mg Oral Daily  . hydrALAZINE  100 mg Oral Q8H  . isosorbide mononitrate  120 mg Oral Daily  . levothyroxine  112 mcg Oral QAC breakfast  . montelukast  10 mg Oral Daily  . pantoprazole  40 mg Oral BID AC  . traZODone  25 mg Oral QHS  . warfarin  2.5 mg Oral q1800  . Warfarin - Physician Dosing Inpatient   Does not apply q1800   Continuous Infusions:  PRN Meds: acetaminophen, albuterol, oxyCODONE  Allergies:    Allergies  Allergen Reactions  . Ace Inhibitors Anaphylaxis and Swelling  . Motrin Ib [Ibuprofen] Anaphylaxis    Social History:   Social History   Socioeconomic History  . Marital status: Divorced     Spouse name: Not on file  . Number of children: Not on file  . Years of education: Not on file  . Highest education level: Not on file  Occupational History  . Not on file  Social Needs  . Financial resource strain: Not on file  . Food insecurity:    Worry: Not on file    Inability: Not on file  . Transportation needs:    Medical: Not on file    Non-medical: Not on file  Tobacco Use  . Smoking status: Former Smoker    Types: Cigarettes  . Smokeless tobacco: Never Used  Substance and Sexual Activity  . Alcohol use: No  . Drug use: No    Comment: marijuana in the past  . Sexual activity: Not Currently  Lifestyle  . Physical activity:    Days per week: Not on file    Minutes per session: Not on file  . Stress: Not on file  Relationships  . Social connections:    Talks on phone: Not on file    Gets together: Not on file    Attends religious service: Not on file    Active member of club or organization: Not on file    Attends meetings of clubs or organizations: Not on file    Relationship status: Not on file  . Intimate partner violence:    Fear of current or ex partner: Not on file    Emotionally abused: Not on file  Physically abused: Not on file    Forced sexual activity: Not on file  Other Topics Concern  . Not on file  Social History Narrative  . Not on file    Family History:    Family History  Problem Relation Age of Onset  . Hypertension Mother      ROS:  Please see the history of present illness.   All other ROS reviewed and negative.     Physical Exam/Data:   Vitals:   04/17/18 0556 04/17/18 0600 04/17/18 0630 04/17/18 0730  BP: (!) 145/87 (!) 150/85 (!) 163/88 (!) 161/88  Pulse: 74 74 80 74  Resp: 15 16 11 14   Temp:      TempSrc:      SpO2: 96% 96% 97% 97%  Weight:       No intake or output data in the 24 hours ending 04/17/18 0854 Filed Weights   04/16/18 1753  Weight: 65.2 kg   Body mass index is 21.23 kg/m.  General:  Thin  AAF, in no acute distress HEENT: normal Lymph: no adenopathy Neck: elevated JVD with + HJR Endocrine:  No thryomegaly Vascular: No carotid bruits; FA pulses 2+ bilaterally without bruits  Cardiac:  RRR, 2/6 murmur along LSB and apex Lungs:  Bibasilar crackles, L>R Abd: soft, nontender, no hepatomegaly  Ext: 2+ LE pitting edema, R>L Musculoskeletal:  No deformities, BUE and BLE strength normal and equal Skin: warm and dry  Neuro:  CNs 2-12 intact, no focal abnormalities noted Psych:  Normal affect   EKG:  The EKG was personally reviewed and demonstrates:  SR w/ LBBB  Telemetry:  Telemetry was personally reviewed and demonstrates:  SR, 70s  Relevant CV Studies: 2D Echo (post MV replacement) 02/22/18 Study Conclusions  - Left ventricle: The cavity size was normal. Wall thickness was   increased in a pattern of mild LVH. Global hypokinesis with   incoordinate septal motion. Systolic function was mildly reduced.   The estimated ejection fraction was in the range of 45% to 50%.   The study is not technically sufficient to allow evaluation of LV   diastolic function. - Mitral valve: Bioprosthetic MVR. No obstruction. Mobile   subvalvular density - may be ruptured cord or less likely   thrombus. Mean gradient (D): 3 mm Hg. Valve area by continuity   equation (using LVOT flow): 1.87 cm^2. - Left atrium: Severely dilated. - Right ventricle: The cavity size was mildly dilated. Mildly   reduced systolic function. - Right atrium: Moderately dilated. - Atrial septum: No defect or patent foramen ovale was identified.   s/p PFO closure. - Tricuspid valve: There was moderate regurgitation. - Pulmonary arteries: PA peak pressure: 32 mm Hg (S). - Pericardium, extracardiac: A trivial pericardial effusion was   identified posterior to the heart. Features were not consistent   with tamponade physiology. There was a left pleural effusion.  Impressions:  - Compared to a prior study in 01/2018,  the LVEF is lower at 45-50%   with global hypokinesis and incoordinate (post-operative) septal   motion. There has been interval placment of a bioprosthetic MV   with normal gradients. There is mobile material below the valve   which may be flail cord. PFO closure is noted without residual   color doppler flow. A trivial persistent posterior pericardial   effusion is noted.  Laboratory Data:  Chemistry Recent Labs  Lab 04/16/18 1303 04/16/18 1805  NA 136 136  K 4.8 4.7  CL 104 100  CO2 24 21*  GLUCOSE 92 229*  BUN 60* 59*  CREATININE 6.00* 6.03*  CALCIUM 9.3 9.4  GFRNONAA 7* 7*  GFRAA 8* 8*  ANIONGAP 8 15    Recent Labs  Lab 04/16/18 1805  PROT 6.3*  ALBUMIN 3.3*  AST 16  ALT 15  ALKPHOS 76  BILITOT 0.5   Hematology Recent Labs  Lab 04/16/18 1805  WBC 6.9  RBC 3.53*  HGB 8.7*  HCT 26.9*  MCV 76.2*  MCH 24.6*  MCHC 32.3  RDW 14.6  PLT 281   Cardiac Enzymes Recent Labs  Lab 04/16/18 1805 04/16/18 2335 04/17/18 0320  TROPONINI 0.07* 0.07* 0.07*   No results for input(s): TROPIPOC in the last 168 hours.  BNP Recent Labs  Lab 04/16/18 1805  BNP 2,442.0*    DDimer No results for input(s): DDIMER in the last 168 hours.  Radiology/Studies:  Dg Chest 2 View  Result Date: 04/16/2018 CLINICAL DATA:  67 y/o  F; chest pain. EXAM: CHEST - 2 VIEW COMPARISON:  03/16/2018 chest radiograph. FINDINGS: Stable cardiomegaly given projection and technique. Mitral valve replacement and PFO closure. Post CABG with sternotomy wires in alignment. Stable 3 lead pacemaker. Small bilateral pleural effusions and chronic pulmonary vascular congestion. No consolidation or pneumothorax. Calcific aortic atherosclerosis. Bones are unremarkable. IMPRESSION: Stable small bilateral pleural effusions, pulmonary vascular congestion, and cardiomegaly. Electronically Signed   By: Kristine Garbe M.D.   On: 04/16/2018 23:36    Assessment and Plan:   Jocelyn Hill is a 67  y.o. AA female with a hx of severe Mitral regurgitation, CAD, PAF s/p recent Bioprosthetic MVR, Maze, LA appendage clip, PFO closure, and CABG with LIMA-LAD and SVG-LCx 07/252, complicated by AKI and CHB requiring CRT-P implantation, who is being seen today for the evaluation of acute on chronic combined systolic and diastolic HF, at the request of Dr. Nori Riis, [facility]. Pt also with worsening renal function.    1. Acute on Chronic Combined Systolic and Diastolic CHF: last echo 07/7060 showed EF at 45-50%. She is volume overloaded and symptomatic. She has failed outpatient diuretics. Now with worsening renal function w/ SCr now up to 6.0. Needs nephrology evaluation to assist with diuretics vs HD initiation for volume removal. Continue HF regimen: Coreg, hydralazine and Imdur. Low sodium diet, fluid restriction, monitoring of renal function and electrolytes. MD to see later.   2. Acute on Chronic Renal Failure: baseline SCr ~4. SCr has increased to 6.0. BUN 59. GFR 8 mL/min. In acute CHF and not responding to IV diuretics. Needs nephrology consult. She is followed by [facility]. Dr. Hollie Salk. May need HD. K is WNL at 4.7. Monitor.     3. CAD/ Chest Pain: s/p CABG with LIMA-LAD and SVG-LCx 01/2018. Complaining of recent CP. Troponins with flat low level trend at 0.07 x 3, not c/w ACS. Suspect likely demand ischemia in the setting of acute CHF, anemia and worsening renal function. Her CP may be 2/2 volume overload. Continue medical therapy.   4. Mitral Valve Disorder: h/o severe MR s/p bioprosthetic MVR 01/2018. Post-op echo showed stable functioning prosthesis. Repeat echo pending.   5. PAF: s/p recent MAZE, LA appendage clip. NSR on tele. HR controlled in the 70s on BB. On Coumadin for a/c. INR slightly subtherapeutic at 1.89.   6. CHB: s/p recent CRT-P implant 01/2018. St Jude. Followed by Dr. Curt Bears.   7. Anemia: progressive decline since 02/18/18. Hgb down from 12.6>>8.7. MCV 75 c/w microcytic  anemia. Ferritin pending.  May also be secondary to renal failure.    8. PFO: s/p closure.   9. HTN: moderately elevated, in the setting of acute volume overload. May need to start HD given worsening renal function. Waiting nephrology consult. Continue Coreg, Hydralazine and Imdur.   MD to follow with further recommendations.   For questions or updates, please contact [place] Please consult [email or web address] for contact info under   Signed, Lyda Jester, PA-C  04/17/2018 8:54 AM   Patient examined chart reviewed Discussed care with PA and Dr Aundra Dubin. Exam with chronically ill white female. Pacer under left clavicle. Basilar rales. 2 component friction rub. LE edema worse in RLE than LLE She has not noted a lot of change in urine output 2nd heart sound split suggesting high PA pressure. INR Rx with PAF post Maze. She needs dialysis for management of her volume. Nephrology to see. Suspect she should still be on 120 mg iv bid lasix   Jenkins Rouge

## 2018-04-17 NOTE — ED Notes (Signed)
Pt placed in hospital bed for comfort. Pt ambulated to bed with stand by assist and walker. Steady gait.

## 2018-04-17 NOTE — ED Provider Notes (Signed)
[facility] EMERGENCY DEPARTMENT Provider Note   CSN: [id number] Arrival date & time: 04/16/18  1729     History   Chief Complaint Chief Complaint  Patient presents with  . Abnormal Lab  . Chest Pain    HPI Jocelyn Hill is a 67 y.o. female resenting for evaluation of chest pain, shortness of breath, elevated creatinine, and fluid overload.  Patient states she was sent to the ER by her cardiologist.  Over the past week to several days, she has been having increasing dyspnea on exertion, orthopnea, weight gain, and leg swelling.  She was given a dose of Lasix IV at the cardiologist office today.  Patient reports a 10 pound weight gain over the past week.  Additionally, patient reports mild chest discomfort, it really improved from previous.  She denies fevers, chills, nausea, vomiting, abdominal pain, urinary symptoms, abnormal bowel movements.  Patient had a pacemaker placed in August.  Is supposed to be on Coumadin, missed 2 doses today due to being at the doctor's/in the ER.   Additional history obtained from chart review, patient with cardiac cath in January. Per Cardiology note from today, pt with new AKI (4 to 6), and likely needs dialysis. Pt not currently on dialysis.  HPI  Past Medical History:  Diagnosis Date  . Anemia    low iron  . Anxiety   . Arthritis   . Bronchitis   . CHF (congestive heart failure) ([place])   . CKD (chronic kidney disease) stage 4, GFR 15-29 ml/min (HCC) 03/16/2017  . Coronary artery disease   . Depression   . Diabetes mellitus without complication ([place])   . Diet-controlled diabetes mellitus ([place])   . Ganglion cyst    left wrist  . GERD (gastroesophageal reflux disease)   . Headache    chronic headaches  . Heart failure ([place])   . Hyperlipidemia   . Hypertension   . Hypothyroidism   . Mitral regurgitation   . Myocardial infarction ([place])    3x last one 2008  . PAF (paroxysmal atrial fibrillation) ([place])   . Pneumonia      x 2  . S/P Maze operation for atrial fibrillation 02/18/2018   Complete bilateral atrial lesion set using bipolar radiofrequency and cryothermy ablation with clipping of LA appendage  . S/P mitral valve replacement with bioprosthetic valve 02/18/2018   [facility] Mitral stented bovine pericardial tissue valve Model 7300 TFX Serial # [id number] Size 29  . Stroke [facility])    no lasting residual - ? 2014  . Umbilical hernia     Patient Active Problem List   Diagnosis Date Noted  . CHF exacerbation ([place]) 04/17/2018  . S/P biventricular cardiac pacemaker procedure 03/05/2018  . S/P CABG x 2 02/18/2018  . S/P mitral valve replacement with bioprosthetic valve + CABG x2 + maze procedure 02/18/2018  . S/P patent foramen ovale closure 02/18/2018  . S/P Maze operation for atrial fibrillation 02/18/2018  . Malnutrition of moderate degree 02/11/2018  . Chest pain due to CAD ([place]) 02/09/2018  . AF (paroxysmal atrial fibrillation) ([place])   . Coronary artery disease involving native coronary artery of native heart   . Rheumatic mitral regurgitation   . Acute renal failure superimposed on stage 4 chronic kidney disease ([place])   . Controlled type 2 diabetes mellitus with diabetic nephropathy ([place])   . Anxiety   . Protein-calorie malnutrition, severe 04/21/2017  . Substance abuse ([place])   . Pulmonary hypertension ([place])   .  Anemia due to chronic kidney disease   . Controlled diabetes mellitus type 2 with complications ([place])   . Severe mitral regurgitation   . Stage 4 chronic kidney disease ([place])   . Microcytic anemia 03/13/2017  . Hyperlipidemia 03/12/2017  . Hypertension 03/12/2017  . Hypothyroidism 03/12/2017  . Depression 03/12/2017  . CHF (congestive heart failure) ([place]) 03/12/2017  . Dysphagia 03/12/2017    Past Surgical History:  Procedure Laterality Date  . ABDOMINAL HYSTERECTOMY     PARTIALS  . BIV PACEMAKER INSERTION CRT-P N/A 02/26/2018   Procedure: BIV PACEMAKER INSERTION CRT-P;   Surgeon: Constance Haw, MD;  Location: [facility] CV LAB;  Service: Cardiovascular;  Laterality: N/A;  . CARDIAC CATHETERIZATION    . CESAREAN SECTION    . CORONARY ARTERY BYPASS GRAFT N/A 02/18/2018   Procedure: CORONARY ARTERY BYPASS GRAFTING (CABG) WITH IMA. ENDOSCOPIC VEIN HARVEST. IMA TO LAD, SVG TO CIRC.;  Surgeon: Rexene Alberts, MD;  Location: [facility];  Service: Open Heart Surgery;  Laterality: N/A;  . INSERTION OF DIALYSIS CATHETER N/A 02/18/2018   Procedure: PLACEMENT OF TEMPORARY HD CATHETER, POWER TRIALYSIS 13FR 20CM;  Surgeon: Rexene Alberts, MD;  Location: [facility];  Service: Vascular;  Laterality: N/A;  . IR NEPHRO TUBE REMOV/FL  04/02/2017  . IR NEPHROSTOGRAM RIGHT THRU EXISTING ACCESS  04/02/2017  . IR NEPHROSTOMY PLACEMENT RIGHT  03/27/2017  . IR THORACENTESIS ASP PLEURAL SPACE W/IMG GUIDE  03/24/2017  . MAZE N/A 02/18/2018   Procedure: MAZE;  Surgeon: Rexene Alberts, MD;  Location: [facility];  Service: Open Heart Surgery;  Laterality: N/A;  . MITRAL VALVE REPLACEMENT N/A 02/18/2018   Procedure: MITRAL VALVE (MV) REPLACEMENT;  Surgeon: Rexene Alberts, MD;  Location: [facility];  Service: Open Heart Surgery;  Laterality: N/A;  . MULTIPLE EXTRACTIONS WITH ALVEOLOPLASTY N/A 11/05/2017   Procedure: Extraction of tooth #'s 4,5,7,8,9,10,12,14 and 29 with alveoloplasty and gross debridement of remaining teeth;  Surgeon: [name], DDS;  Location: [facility];  Service: Oral Surgery;  Laterality: N/A;  . PATENT FORAMEN OVALE(PFO) CLOSURE N/A 02/18/2018   Procedure: PATENT FORAMEN OVALE (PFO) CLOSURE;  Surgeon: Rexene Alberts, MD;  Location: [facility];  Service: Open Heart Surgery;  Laterality: N/A;  . RIGHT/LEFT HEART CATH AND CORONARY ANGIOGRAPHY N/A 07/03/2017   Procedure: RIGHT/LEFT HEART CATH AND CORONARY ANGIOGRAPHY;  Surgeon: Larey Dresser, MD;  Location: [place] CV LAB;  Service: Cardiovascular;  Laterality: N/A;  . TEE WITHOUT CARDIOVERSION N/A 10/08/2017   Procedure:  TRANSESOPHAGEAL ECHOCARDIOGRAM (TEE);  Surgeon: Larey Dresser, MD;  Location: [facility] ENDOSCOPY;  Service: Cardiovascular;  Laterality: N/A;  . TEE WITHOUT CARDIOVERSION N/A 02/18/2018   Procedure: TRANSESOPHAGEAL ECHOCARDIOGRAM (TEE);  Surgeon: Rexene Alberts, MD;  Location: [facility];  Service: Open Heart Surgery;  Laterality: N/A;  . TONSILLECTOMY    . TUBAL LIGATION       OB History   None      Home Medications    Prior to Admission medications   Medication Sig Start Date End Date Taking? Authorizing Provider  acetaminophen (TYLENOL) 325 MG tablet Take 2 tablets (650 mg total) by mouth every 6 (six) hours as needed for mild pain, fever or headache. 04/23/17  Yes Allie Bossier, MD  albuterol (PROVENTIL HFA;VENTOLIN HFA) 108 (90 Base) MCG/ACT inhaler Inhale 2 puffs into the lungs every 6 (six) hours as needed for wheezing or shortness of breath.   Yes [provider]  ALPRAZolam Duanne Moron) 0.25 MG  tablet Take 1 tablet (0.25 mg total) by mouth at bedtime as needed for anxiety. 04/16/18  Yes Georgiana Shore, NP  amLODipine (NORVASC) 10 MG tablet Take 1 tablet (10 mg total) by mouth daily. 04/24/17  Yes Allie Bossier, MD  aspirin EC 81 MG EC tablet Take 1 tablet (81 mg total) by mouth daily. 04/24/17  Yes Allie Bossier, MD  atorvastatin (LIPITOR) 80 MG tablet Take 1 tablet (80 mg total) by mouth daily. 03/06/18  Yes Barrett, Erin R, PA-C  b complex vitamins tablet Take 1 tablet by mouth daily.   Yes [provider]  buPROPion (WELLBUTRIN) 100 MG tablet Take 100 mg by mouth 3 (three) times daily.   Yes [provider]  busPIRone (BUSPAR) 15 MG tablet Take 1 tablet (15 mg total) by mouth 2 (two) times daily. 04/23/17  Yes Allie Bossier, MD  carvedilol (COREG) 6.25 MG tablet Take 6.25 mg by mouth 2 (two) times daily.   Yes [provider]  cholecalciferol (VITAMIN D) 1000 units tablet Take 1,000 Units by mouth daily.   Yes [provider]  doxazosin  (CARDURA) 8 MG tablet Take 8 mg (1 tab) in am and 4 mg (1/2 tab) in pm Patient taking differently: Take 4-8 mg by mouth See admin instructions. Take 1 tablet every morning and take 1/2 tablet every evening 03/11/18  Yes Clegg, Amy D, NP  DULoxetine (CYMBALTA) 30 MG capsule Take 1 capsule (30 mg total) by mouth daily. 04/24/17  Yes Allie Bossier, MD  gabapentin (NEURONTIN) 300 MG capsule Take 300 mg by mouth at bedtime. 10/02/17  Yes [provider]  hydrALAZINE (APRESOLINE) 100 MG tablet Take 1 tablet (100 mg total) by mouth every 8 (eight) hours. 03/05/18  Yes Barrett, Erin R, PA-C  isosorbide mononitrate (IMDUR) 120 MG 24 hr tablet Take 1 tablet (120 mg total) by mouth daily. 04/24/17  Yes Allie Bossier, MD  levothyroxine (SYNTHROID, LEVOTHROID) 112 MCG tablet Take 112 mcg by mouth daily before breakfast.   Yes [provider]  montelukast (SINGULAIR) 10 MG tablet Take 1 tablet (10 mg total) by mouth daily. 04/24/17  Yes Allie Bossier, MD  Nutritional Supplements (CARNATION BREAKFAST ESSENTIALS PO) Take 237 mLs by mouth 2 (two) times daily.   Yes [provider]  oxyCODONE (OXY IR/ROXICODONE) 5 MG immediate release tablet Take 1 tablet (5 mg total) by mouth every 4 (four) hours as needed for severe pain. 03/05/18  Yes Barrett, Erin R, PA-C  pantoprazole (PROTONIX) 40 MG tablet Take 1 tablet (40 mg total) by mouth 2 (two) times daily before a meal. 04/23/17  Yes Allie Bossier, MD  potassium chloride SA (K-DUR,KLOR-CON) 20 MEQ tablet Take 20 mEq by mouth daily.   Yes [provider]  torsemide (DEMADEX) 20 MG tablet Take 100 mg in am and 60 mg in pm Patient taking differently: Take 60-100 mg by mouth See admin instructions. Take 5 tablets every morning then take 3 tablets every evening 04/02/18  Yes Larey Dresser, MD  traZODone (DESYREL) 25 mg TABS tablet Take 25 mg by mouth at bedtime.   Yes [provider]  warfarin (COUMADIN) 2.5 MG tablet Take 1  tablet (2.5 mg total) by mouth daily at 6 PM. 03/05/18  Yes Barrett, Erin R, PA-C  metolazone (ZAROXOLYN) 2.5 MG tablet Take 1 tablet (2.5 mg total) by mouth 2 (two) times a week. 04/19/18 07/18/18  Georgiana Shore, NP  ondansetron [facility]) 4  MG tablet Take 1 tablet (4 mg total) by mouth every 8 (eight) hours as needed for nausea or vomiting. Patient not taking: Reported on 04/17/2018 04/23/17 04/23/18  Allie Bossier, MD    Family History Family History  Problem Relation Age of Onset  . Hypertension Mother     Social History Social History   Tobacco Use  . Smoking status: Former Smoker    Types: Cigarettes  . Smokeless tobacco: Never Used  Substance Use Topics  . Alcohol use: No  . Drug use: No    Comment: marijuana in the past     Allergies   Ace inhibitors and Motrin ib [ibuprofen]   Review of Systems Review of Systems  Constitutional: Positive for unexpected weight change.  Respiratory: Positive for shortness of breath.   Cardiovascular: Positive for chest pain and leg swelling.  All other systems reviewed and are negative.    Physical Exam Updated Vital Signs BP (!) 162/88   Pulse 83   Temp 98.6 F (37 C) (Oral)   Resp 16   Wt 65.2 kg   SpO2 94%   BMI 21.23 kg/m   Physical Exam  Constitutional: She is oriented to person, place, and time.  Elderly, chronically ill-appearing female.  HENT:  Head: Normocephalic and atraumatic.  Eyes: Pupils are equal, round, and reactive to light. Conjunctivae and EOM are normal.  Neck: Normal range of motion. Neck supple.  Cardiovascular: Normal rate, regular rhythm and intact distal pulses.  Pulmonary/Chest: Effort normal and breath sounds normal. No respiratory distress. She has no wheezes.  Speaking in full sentences.  Clear lung sounds.  Abdominal: Soft. She exhibits no distension and no mass. There is no tenderness. There is no rebound and no guarding.  Musculoskeletal: Normal range of motion. She exhibits edema.    Bilateral pitting edema, right worse than left (baseline per pt).  Pedal pulses intact.  Neurological: She is alert and oriented to person, place, and time.  Skin: Skin is warm and dry. Capillary refill takes less than 2 seconds.  Psychiatric: She has a normal mood and affect.  Nursing note and vitals reviewed.    ED Treatments / Results  Labs (all labs ordered are listed, but only abnormal results are displayed) Labs Reviewed  BASIC METABOLIC PANEL - Abnormal; Notable for the following components:      Result Value   CO2 21 (*)    Glucose, Bld 229 (*)    BUN 59 (*)    Creatinine, Ser 6.03 (*)    GFR calc non Af Amer 7 (*)    GFR calc Af Amer 8 (*)    All other components within normal limits  CBC - Abnormal; Notable for the following components:   RBC 3.53 (*)    Hemoglobin 8.7 (*)    HCT 26.9 (*)    MCV 76.2 (*)    MCH 24.6 (*)    All other components within normal limits  TROPONIN I - Abnormal; Notable for the following components:   Troponin I 0.07 (*)    All other components within normal limits  HEPATIC FUNCTION PANEL - Abnormal; Notable for the following components:   Total Protein 6.3 (*)    Albumin 3.3 (*)    All other components within normal limits  TROPONIN I - Abnormal; Notable for the following components:   Troponin I 0.07 (*)    All other components within normal limits    EKG EKG Interpretation  Date/Time:  Friday April 16 2018 22:59:13 EDT Ventricular Rate:  85 PR Interval:  174 QRS Duration: 120 QT Interval:  417 QTC Calculation: 496 R Axis:   -78 Text Interpretation:  Sinus rhythm Nonspecific IVCD with LAD Inferior infarct, old Probable anteroseptal infarct, recent Baseline wander in lead(s) V6 No STEMI.  Confirmed by Nanda Quinton [phone]) on 04/16/2018 11:56:38 PM   Radiology Dg Chest 2 View  Result Date: 04/16/2018 CLINICAL DATA:  67 y/o  F; chest pain. EXAM: CHEST - 2 VIEW COMPARISON:  03/16/2018 chest radiograph. FINDINGS: Stable  cardiomegaly given projection and technique. Mitral valve replacement and PFO closure. Post CABG with sternotomy wires in alignment. Stable 3 lead pacemaker. Small bilateral pleural effusions and chronic pulmonary vascular congestion. No consolidation or pneumothorax. Calcific aortic atherosclerosis. Bones are unremarkable. IMPRESSION: Stable small bilateral pleural effusions, pulmonary vascular congestion, and cardiomegaly. Electronically Signed   By: Kristine Garbe M.D.   On: 04/16/2018 23:36    Procedures Procedures (including critical care time)  Medications Ordered in ED Medications - No data to display   Initial Impression / Assessment and Plan / ED Course  I have reviewed the triage vital signs and the nursing notes.  Pertinent labs & imaging results that were available during my care of the patient were reviewed by me and considered in my medical decision making (see chart for details).     Patient presenting for evaluation of weight gain, leg swelling, shortness of breath, chest pain.  Physical exam shows patient who appears clinically fluid overloaded.  Bilateral pitting edema.  Patient reporting orthopnea and dyspnea on exertion.  Labs at PCP today showed elevated creatinine from baseline, and cardiology is concerned that patient may need dialysis.  Will obtain labs, chest x-ray, EKG, and reassess.  Chest x-ray viewed and read by me, shows bilateral pleural effusions and cardiomegaly without obvious pneumonia.  EKG without STEMI.  Labs concerning, shows new AKI.  Patient's creatinine is normally 4, today is at 6.  Additionally, troponin mildly elevated at 0.07.  This is likely related to patient's creatinine, however will call cardiology for consult.  Discussed with attending, Dr. Laverta Baltimore evaluated the patient.  Will call for admission due to AKI and elevated troponin.   Discussed with IMTS, pt to be admitted.   Have not yet talked to cardiology regarding patient.   Final  Clinical Impressions(s) / ED Diagnoses   Final diagnoses:  Elevated troponin  AKI (acute kidney injury) [facility])  Pleural effusion    ED Discharge Orders    None       Franchot Heidelberg, PA-C 04/17/18 0150    Margette Fast, MD 04/17/18 [phone]

## 2018-04-17 NOTE — ED Notes (Signed)
Messaged pharmacy to verify medications 

## 2018-04-17 NOTE — ED Notes (Signed)
Ordered diet tray for pt  

## 2018-04-17 NOTE — H&P (Addendum)
[facility] Admission History and Physical Service Pager: [phone]  Patient name: Jocelyn Hill Medical record number: [id number] Date of birth: 1951-03-27 Age: 67 y.o. Gender: female  Primary Care Provider: [name], MD Consultants: Cardiology Code Status: Full Code  Chief Complaint: Elevated creatinine and chest pain  Assessment and Plan: Jocelyn Hill is a 67 y.o. female presenting from cardiology office with elevated creatinine and chest pain. PMH is significant for CHF, CKD IV, HTN, CAD s/p MI and CABG (01/2018), hypothyroidism, paroxysmal A-fib s/p MAZE (01/2018), mitral regurgitation s/p mitral valve replacement (01/2018), microcytic anemia, anxiety/depression.  HFrEF exacerbation  ARF in setting of CKD IV: Acute on chronic in the setting of 2 weeks of worsening LE edema, orthopnea, PND, dyspnea on exertion, and 10 pound weight gain. Labs from cardiology appointment yesterday showed elevation in creatine from her baseline of 4 to 6. On admission, BNP 2,442 and CXR significant for stable small bilateral pleural effusions and pulmonary vascular congestion. On exam, patient is hemodynamically stable on RA, afebrile, and appears obviously volume overloaded. Patient was given 80mg  Lasix at her appointment with good urine output, however per chart review it appears patient had recent increase in torsemide dose without increased urine output. No further diuresis attempted at this time. Cardiology consulted and will consult nephrology in the morning. EDW ~125-130. Wt on admission: 143.8.  Albumin 3.3 on admission, slightly above baseline (2.8) which is likely contributing to lower extremity edema.  Patient reported no rectal bleeding though occult GI bleed could explain lower extremity edema though less likely. - Admit to inpatient, step down, attending Dr. Nori Riis - Cardiology consulted, will follow-up - Consult Nephrology in AM - PT/OT consulted - Trend  troponins x 3 q6 hours - Repeat EKG in AM - Continuous cardiac monitor  - Continuous pulse ox with O2 PRN - Daily weights - Strict I&O's - AM CBC, CMP, TSH, PT/INR - Renal diet with fluid restriction - Aspirin 81mg  - Continue home Atorvastatin, Coreg, doxazosin, hydralazine, Imdur, Warfarin - Hold home Torsemide and metolazone pending cardiology recs - Continue home Oxycodone 5mg  IR q4 PRN for pain - Tylenol PRN - Incentive spirometry - Avoid nephrotoxic agents  Atypical Chest Pain: Chronic, occuring since August following CABG. Atypical in that it does not radiate, not associated with exertion, and is intermittent noted to occur along incision line and at pacemaker site. Patient has history of CAD s/p MI in August 2019 with CABG and biventricular pacemaker in place 2/2 to complete heart block. Troponin elevated on admission but EKG negative for signs of STEMI. Elevated troponin most likely from acute kidney injury and fluid overload. Chest pain appears most likely associated with recent open heart surgery at this time. Unlikely pneumonia as patient has been afebrile and WBC 6.9 without any cough. Also doubt PE as Wells score 0, patient has been ambulating well without LE pain, no history of recent immobilization, and vitals are stable. Last TSH in 10/26/17 was WNL and currently on chronic Synthroid. On exam, patient was having intermittent sharp chest pain at base of incision line that was tender to palpation. Will continue to monitor closely due to patient's extensive cardiac history. Will trend troponins, repeat EKG, obtain echocardiogram in AM, and TSH.  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 Biprosthetic MV replacement in August 2019. Post-op echo was stable. - See plan above  Atrial Fibrillation s/p MAZE: chronic, stable MAZE procedure in August 2019. No recurrence since. Denies bleeding. - Continue Warfarin   HTN: Elvated 160s/90s. Will likely improve with diuresis. On home hydralazine, amlodipine, Coreg, doxazosin.  - Continue hydralazine, Coreg, and doxazosin - Hold amlodipine due to AKI - Monitor BP's  Microcytic Anemia: chronic, stable Hgb 8.7 (BL 8 since August 2019), MCV 76.2 (BL 80-83). Most recent Ferritin 04/21/17 was 620. No signs of bleed. - Ferritin level - Continue to monitor  Hypothyroidism:  TSH on 10/26/17: 2.884. On Synthroid. - Continue home synthroid.  GERD: - Continue home protonix 40mg  BID  Anxiety/Depression: chronic, stable On home Wellbutrin, Cymbalta, Xanax PRN, and Trazodone. A&Ox3 and denies SI or HI - Continue home Cymbalta, Wellbutrin, and Trazodone  FEN/GI: Renal diet with fluid restriction, Replace electrolytes PRN, Protonix  Prophylaxis: Warfarin  Disposition: Step down   History of Present Illness:  Jocelyn Hill is a 67 y.o. female presenting with two weeks of worsening SOB, LE edema, orthopnea, and paroxysmal nocturnal dyspnea. Patient was seen by cardiologist yesterday and noted worsening edema, SOB, and 10 pound weight gain. Cardiologist gave IV 80mg  lasix and had labs drawn. When labs returned, patient was informed of her elevated creatinine and instructed to come to the ED. Patient has noted increased frequency of urination over the last 2 weeks after an increase in her Torsemide.   Patient sees Dr. Madelon Lips for her CKD stage IV. She has had discussion about HD in the future but says "they want to hold on it" for now. She apparently had a vascular mapping appointment yesterday  that she was unaware of and missed.  Patient has also complained of some chest pain near her CABG incision site and pacemaker site that is intermitent, sharp, and unrelated to activity. She denies any fevers, cough, nausea, diarrhea, hematemesis, hematochezia, or hematuria. She has noted 2-3 days of constipation that is unusual for her. She has also had several episodes of NBNB emesis that occurs randomly and not associated with food.   In the ED, patient was mildly hypertensive but otherwise hemodynamically stable on RA and afebrile. Initial troponin was elevated at 0.07 however EKG showed NSR without signs of STEMI. Creatinine was 6 (baseline 4) and BNP elevated at 2442. CBC WNL except for Hgb of 8.7 (baseline 8) and MCV of 76.2 (BL 80-83). CXR significant for stable small bilateral pleural effusions, pulmonary vascular congestion, and cardiomegaly. Patient was admitted for AKI on CKD IV and chest pain rule-out.    Review Of Systems: See HPI.  Review of Systems  Constitutional: Negative for chills and fever.  HENT: Negative for congestion and sinus pain.   Eyes: Positive for blurred vision. Negative for double vision.  Cardiovascular: Positive for chest pain, palpitations and leg swelling.  Gastrointestinal: Positive for constipation. Negative for abdominal pain, blood in stool, nausea and vomiting.  Genitourinary: Positive for frequency. Negative for dysuria, hematuria and urgency.  Musculoskeletal: Positive for back pain and myalgias.  Skin: Negative for itching and rash.  Neurological: Positive for weakness and headaches.  Psychiatric/Behavioral: Positive for depression. Negative for substance abuse.    Patient Active Problem List  Diagnosis Date Noted  . CHF exacerbation ([place]) 04/17/2018  . S/P biventricular cardiac pacemaker procedure 03/05/2018  . S/P CABG x 2 02/18/2018  . S/P mitral valve replacement with bioprosthetic valve + CABG x2 + maze procedure 02/18/2018  . S/P patent  foramen ovale closure 02/18/2018  . S/P Maze operation for atrial fibrillation 02/18/2018  . Malnutrition of moderate degree 02/11/2018  . Chest pain due to CAD ([place]) 02/09/2018  . AF (paroxysmal atrial fibrillation) ([place])   . Coronary artery disease involving native coronary artery of native heart   . Rheumatic mitral regurgitation   . Acute renal failure superimposed on stage 4 chronic kidney disease ([place])   . Controlled type 2 diabetes mellitus with diabetic nephropathy ([place])   . Anxiety   . Protein-calorie malnutrition, severe 04/21/2017  . Substance abuse ([place])   . Pulmonary hypertension ([place])   . Anemia due to chronic kidney disease   . Controlled diabetes mellitus type 2 with complications ([place])   . Severe mitral regurgitation   . Stage 4 chronic kidney disease ([place])   . Microcytic anemia 03/13/2017  . Hyperlipidemia 03/12/2017  . Hypertension 03/12/2017  . Hypothyroidism 03/12/2017  . Depression 03/12/2017  . CHF (congestive heart failure) ([place]) 03/12/2017  . Dysphagia 03/12/2017    Past Medical History: Past Medical History:  Diagnosis Date  . Anemia    low iron  . Anxiety   . Arthritis   . Bronchitis   . CHF (congestive heart failure) ([place])   . CKD (chronic kidney disease) stage 4, GFR 15-29 ml/min ([facility]) 03/16/2017  . Coronary artery disease   . Depression   . Diabetes mellitus without complication ([place])   . Diet-controlled diabetes mellitus ([place])   . Ganglion cyst    left wrist  . GERD (gastroesophageal reflux disease)   . Headache    chronic headaches  . Heart failure ([place])   . Hyperlipidemia   . Hypertension   . Hypothyroidism   . Mitral regurgitation   . Myocardial infarction ([place])    3x last one 2008  . PAF (paroxysmal atrial fibrillation) ([place])   . Pneumonia    x 2  . S/P Maze operation for atrial fibrillation 02/18/2018   Complete bilateral atrial lesion set using bipolar radiofrequency and cryothermy ablation with clipping of LA appendage  .  S/P mitral valve replacement with bioprosthetic valve 02/18/2018   [facility] Mitral stented bovine pericardial tissue valve Model 7300 TFX Serial # [id number] Size 29  . Stroke [facility])    no lasting residual - ? 2014  . Umbilical hernia     Past Surgical History: Past Surgical History:  Procedure Laterality Date  . ABDOMINAL HYSTERECTOMY     PARTIALS  . BIV PACEMAKER INSERTION CRT-P N/A 02/26/2018   Procedure: BIV PACEMAKER INSERTION CRT-P;  Surgeon: Constance Haw, MD;  Location: [facility];  Service: Cardiovascular;  Laterality: N/A;  . CARDIAC CATHETERIZATION    . CESAREAN SECTION    . CORONARY ARTERY BYPASS GRAFT N/A 02/18/2018   Procedure: CORONARY ARTERY BYPASS GRAFTING (CABG) WITH IMA. ENDOSCOPIC VEIN HARVEST. IMA TO LAD, SVG TO CIRC.;  Surgeon: Rexene Alberts, MD;  Location: [facility];  Service: Open Heart Surgery;  Laterality: N/A;  . INSERTION OF DIALYSIS CATHETER N/A 02/18/2018   Procedure: PLACEMENT OF TEMPORARY HD CATHETER, POWER TRIALYSIS 13FR 20CM;  Surgeon: Rexene Alberts, MD;  Location: [facility];  Service: Vascular;  Laterality: N/A;  . IR NEPHRO TUBE REMOV/FL  04/02/2017  .  IR NEPHROSTOGRAM RIGHT THRU EXISTING ACCESS  04/02/2017  . IR NEPHROSTOMY PLACEMENT RIGHT  03/27/2017  . IR THORACENTESIS ASP PLEURAL SPACE W/IMG GUIDE  03/24/2017  . MAZE N/A 02/18/2018   Procedure: MAZE;  Surgeon: Rexene Alberts, MD;  Location: [facility];  Service: Open Heart Surgery;  Laterality: N/A;  . MITRAL VALVE REPLACEMENT N/A 02/18/2018   Procedure: MITRAL VALVE (MV) REPLACEMENT;  Surgeon: Rexene Alberts, MD;  Location: [facility];  Service: Open Heart Surgery;  Laterality: N/A;  . MULTIPLE EXTRACTIONS WITH ALVEOLOPLASTY N/A 11/05/2017   Procedure: Extraction of tooth #'s 4,5,7,8,9,10,12,14 and 29 with alveoloplasty and gross debridement of remaining teeth;  Surgeon: [name], DDS;  Location: [facility];  Service: Oral Surgery;  Laterality: N/A;  . PATENT FORAMEN OVALE(PFO) CLOSURE N/A  02/18/2018   Procedure: PATENT FORAMEN OVALE (PFO) CLOSURE;  Surgeon: Rexene Alberts, MD;  Location: [facility];  Service: Open Heart Surgery;  Laterality: N/A;  . RIGHT/LEFT HEART CATH AND CORONARY ANGIOGRAPHY N/A 07/03/2017   Procedure: RIGHT/LEFT HEART CATH AND CORONARY ANGIOGRAPHY;  Surgeon: Larey Dresser, MD;  Location: [facility];  Service: Cardiovascular;  Laterality: N/A;  . TEE WITHOUT CARDIOVERSION N/A 10/08/2017   Procedure: TRANSESOPHAGEAL ECHOCARDIOGRAM (TEE);  Surgeon: Larey Dresser, MD;  Location: [facility];  Service: Cardiovascular;  Laterality: N/A;  . TEE WITHOUT CARDIOVERSION N/A 02/18/2018   Procedure: TRANSESOPHAGEAL ECHOCARDIOGRAM (TEE);  Surgeon: Rexene Alberts, MD;  Location: [facility];  Service: Open Heart Surgery;  Laterality: N/A;  . TONSILLECTOMY    . TUBAL LIGATION      Social History: Social History   Tobacco Use  . Smoking status: Former Smoker    Types: Cigarettes  . Smokeless tobacco: Never Used  Substance Use Topics  . Alcohol use: No  . Drug use: No    Comment: marijuana in the past   Additional social history: Lives alone. Daughter and grandson come by to help her. Lives in [place].  Family History: Family History  Problem Relation Age of Onset  . Hypertension Mother     Allergies and Medications: Allergies  Allergen Reactions  . Ace Inhibitors Anaphylaxis and Swelling  . Motrin Ib [Ibuprofen] Anaphylaxis   No current facility-administered medications on file prior to encounter.    Current Outpatient Medications on File Prior to Encounter  Medication Sig Dispense Refill  . acetaminophen (TYLENOL) 325 MG tablet Take 2 tablets (650 mg total) by mouth every 6 (six) hours as needed for mild pain, fever or headache. 30 tablet 0  . albuterol (PROVENTIL HFA;VENTOLIN HFA) 108 (90 Base) MCG/ACT inhaler Inhale 2 puffs into the lungs every 6 (six) hours as needed for wheezing or shortness of breath.    . ALPRAZolam (XANAX) 0.25 MG tablet  Take 1 tablet (0.25 mg total) by mouth at bedtime as needed for anxiety. 30 tablet 0  . amLODipine (NORVASC) 10 MG tablet Take 1 tablet (10 mg total) by mouth daily. 30 tablet 0  . aspirin EC 81 MG EC tablet Take 1 tablet (81 mg total) by mouth daily. 30 tablet 0  . atorvastatin (LIPITOR) 80 MG tablet Take 1 tablet (80 mg total) by mouth daily. 30 tablet 3  . b complex vitamins tablet Take 1 tablet by mouth daily.    Marland Kitchen buPROPion (WELLBUTRIN) 100 MG tablet Take 100 mg by mouth 3 (three) times daily.    . busPIRone (BUSPAR) 15 MG tablet Take 1 tablet (15 mg total) by mouth 2 (two) times  daily. 60 tablet 0  . carvedilol (COREG) 6.25 MG tablet Take 6.25 mg by mouth 2 (two) times daily.    . cholecalciferol (VITAMIN D) 1000 units tablet Take 1,000 Units by mouth daily.    Marland Kitchen doxazosin (CARDURA) 8 MG tablet Take 8 mg (1 tab) in am and 4 mg (1/2 tab) in pm (Patient taking differently: Take 4-8 mg by mouth See admin instructions. Take 1 tablet every morning and take 1/2 tablet every evening) 45 tablet 11  . DULoxetine (CYMBALTA) 30 MG capsule Take 1 capsule (30 mg total) by mouth daily. 30 capsule 0  . gabapentin (NEURONTIN) 300 MG capsule Take 300 mg by mouth at bedtime.  11  . hydrALAZINE (APRESOLINE) 100 MG tablet Take 1 tablet (100 mg total) by mouth every 8 (eight) hours. 90 tablet 3  . isosorbide mononitrate (IMDUR) 120 MG 24 hr tablet Take 1 tablet (120 mg total) by mouth daily. 30 tablet 0  . levothyroxine (SYNTHROID, LEVOTHROID) 112 MCG tablet Take 112 mcg by mouth daily before breakfast.    . montelukast (SINGULAIR) 10 MG tablet Take 1 tablet (10 mg total) by mouth daily. 30 tablet 0  . Nutritional Supplements (CARNATION BREAKFAST ESSENTIALS PO) Take 237 mLs by mouth 2 (two) times daily.    Marland Kitchen oxyCODONE (OXY IR/ROXICODONE) 5 MG immediate release tablet Take 1 tablet (5 mg total) by mouth every 4 (four) hours as needed for severe pain. 30 tablet 0  . pantoprazole (PROTONIX) 40 MG tablet Take 1  tablet (40 mg total) by mouth 2 (two) times daily before a meal. 60 tablet 0  . potassium chloride SA (K-DUR,KLOR-CON) 20 MEQ tablet Take 20 mEq by mouth daily.    Marland Kitchen torsemide (DEMADEX) 20 MG tablet Take 100 mg in am and 60 mg in pm (Patient taking differently: Take 60-100 mg by mouth See admin instructions. Take 5 tablets every morning then take 3 tablets every evening)    . traZODone (DESYREL) 25 mg TABS tablet Take 25 mg by mouth at bedtime.    Marland Kitchen warfarin (COUMADIN) 2.5 MG tablet Take 1 tablet (2.5 mg total) by mouth daily at 6 PM. 30 tablet 3  . [START ON 04/19/2018] metolazone (ZAROXOLYN) 2.5 MG tablet Take 1 tablet (2.5 mg total) by mouth 2 (two) times a week. 15 tablet 3  . ondansetron (ZOFRAN) 4 MG tablet Take 1 tablet (4 mg total) by mouth every 8 (eight) hours as needed for nausea or vomiting. (Patient not taking: Reported on 04/17/2018) 30 tablet 0    Objective: BP (!) 150/85   Pulse 74   Temp 98.6 F (37 C) (Oral)   Resp 16   Wt 65.2 kg   SpO2 96%   BMI 21.23 kg/m  Physical Exam:  Gen: NAD, alert, non-toxic, well-appearing, sitting comfortably  Skin: Warm and dry. No obvious rashes, lesions, or trauma. Some darkening of LE bilaterally with flanking   HEENT: NCAT No conjunctival pallor or injection. No scleral icterus or injection.  MMM.  CV: RRR. <2s capillary refill bilaterally.  RP & DPs 2+ bilaterally. 2-3+ pitting edema bilaterally extending to knees. +JVD Resp: Crackles heard in lower lobes bilaterally. No increased WOB on room air Abd: Tender to palpation along lower abdomen bilaterally. Positive bowel sounds. Psych: Cooperative with exam. Pleasant. Makes eye contact. Speech normal. Extremities: Moves all extremities spontaneously    Labs and Imaging: CBC BMET  Recent Labs  Lab 04/16/18 1805  WBC 6.9  HGB 8.7*  HCT 26.9*  PLT  281   Recent Labs  Lab 04/16/18 1805  NA 136  K 4.7  CL 100  CO2 21*  BUN 59*  CREATININE 6.03*  GLUCOSE 229*  CALCIUM 9.4      Dg Chest 2 View  Result Date: 04/16/2018 CLINICAL DATA:  67 y/o  F; chest pain. EXAM: CHEST - 2 VIEW COMPARISON:  03/16/2018 chest radiograph. FINDINGS: Stable cardiomegaly given projection and technique. Mitral valve replacement and PFO closure. Post CABG with sternotomy wires in alignment. Stable 3 lead pacemaker. Small bilateral pleural effusions and chronic pulmonary vascular congestion. No consolidation or pneumothorax. Calcific aortic atherosclerosis. Bones are unremarkable. IMPRESSION: Stable small bilateral pleural effusions, pulmonary vascular congestion, and cardiomegaly. Electronically Signed   By: Kristine Garbe M.D.   On: 04/16/2018 23:36    Danna Hefty, DO 04/17/2018, 6:22 AM PGY-1, [place] Intern pager: [phone], text pages welcome  Upper Level Addendum: I have seen and evaluated this patient along with Dr. Tarry Kos and reviewed the above note, making necessary revisions in blue.  Harriet [place]te, [place], PGY-3

## 2018-04-17 NOTE — Consult Note (Addendum)
Renal Service Consult Note [facility]  ALLYANNA APPLEMAN 04/17/2018 [place] Requesting Physician:  Dr Nori Riis, Chauncey Cruel.   Reason for Consult:  Acute on CKD HPI: The patient is a 67 y.o. year-old w/ hx of HTN, MI, PAF, CKD stage IV, CAD sp CABG / BIV pacemaker / Maze / PFO / MV replacement in 8/19 presented w/ CP, leg edema, SOB/ PND and wt gain on 10/19 today.  In ED creat was up at 6.0, baseline creat from Aug- Sept 2019 was around 4.0- 5.0.  We are asked to see for worsening renal function.    Patietn is f/ b dr Hollie Salk at [facility].  No access in place.  Patient describes nausea and fatigue for 1-2 weeks, jerking of arms/ hands for a few days.  Fatigued. has not been voidign as much as usual at home.  + swelling of legs and abdomen. +orthopnea and DOE.    She was here in August for CABG / MVR/ maze/ PFO closure/ BIV pacer. She states was here last year and "almost died".  She is interested in doing dialysis.   ROS  denies CP  no joint pain   no HA  no blurry vision  no rash  no diarrhea  no dysuria  no difficulty voiding  no change in urine color    Past Medical History  Past Medical History:  Diagnosis Date  . Anemia    low iron  . Anxiety   . Arthritis   . Bronchitis   . CHF (congestive heart failure) ([place])   . CKD (chronic kidney disease) stage 4, GFR 15-29 ml/min (HCC) 03/16/2017  . Coronary artery disease   . Depression   . Diabetes mellitus without complication ([place])   . Diet-controlled diabetes mellitus ([place])   . Ganglion cyst    left wrist  . GERD (gastroesophageal reflux disease)   . Headache    chronic headaches  . Heart failure ([place])   . Hyperlipidemia   . Hypertension   . Hypothyroidism   . Mitral regurgitation   . Myocardial infarction ([place])    3x last one 2008  . PAF (paroxysmal atrial fibrillation) ([place])   . Pneumonia    x 2  . S/P Maze operation for atrial fibrillation 02/18/2018   Complete bilateral atrial lesion set using bipolar  radiofrequency and cryothermy ablation with clipping of LA appendage  . S/P mitral valve replacement with bioprosthetic valve 02/18/2018   [facility] Mitral stented bovine pericardial tissue valve Model 7300 TFX Serial # [id number] Size 29  . Stroke [facility])    no lasting residual - ? 2014  . Umbilical hernia    Past Surgical History  Past Surgical History:  Procedure Laterality Date  . ABDOMINAL HYSTERECTOMY     PARTIALS  . BIV PACEMAKER INSERTION CRT-P N/A 02/26/2018   Procedure: BIV PACEMAKER INSERTION CRT-P;  Surgeon: Constance Haw, MD;  Location: [place] [facility];  Service: Cardiovascular;  Laterality: N/A;  . CARDIAC CATHETERIZATION    . CESAREAN SECTION    . CORONARY ARTERY BYPASS GRAFT N/A 02/18/2018   Procedure: CORONARY ARTERY BYPASS GRAFTING (CABG) WITH IMA. ENDOSCOPIC VEIN HARVEST. IMA TO LAD, SVG TO CIRC.;  Surgeon: Rexene Alberts, MD;  Location: [place];  Service: Open Heart Surgery;  Laterality: N/A;  . INSERTION OF DIALYSIS CATHETER N/A 02/18/2018   Procedure: PLACEMENT OF TEMPORARY HD CATHETER, POWER TRIALYSIS 13FR 20CM;  Surgeon: Rexene Alberts, MD;  Location: [place];  Service:  Vascular;  Laterality: N/A;  . IR NEPHRO TUBE REMOV/FL  04/02/2017  . IR NEPHROSTOGRAM RIGHT THRU EXISTING ACCESS  04/02/2017  . IR NEPHROSTOMY PLACEMENT RIGHT  03/27/2017  . IR THORACENTESIS ASP PLEURAL SPACE W/IMG GUIDE  03/24/2017  . MAZE N/A 02/18/2018   Procedure: MAZE;  Surgeon: Rexene Alberts, MD;  Location: [place];  Service: Open Heart Surgery;  Laterality: N/A;  . MITRAL VALVE REPLACEMENT N/A 02/18/2018   Procedure: MITRAL VALVE (MV) REPLACEMENT;  Surgeon: Rexene Alberts, MD;  Location: [place];  Service: Open Heart Surgery;  Laterality: N/A;  . MULTIPLE EXTRACTIONS WITH ALVEOLOPLASTY N/A 11/05/2017   Procedure: Extraction of tooth #'s 4,5,7,8,9,10,12,14 and 29 with alveoloplasty and gross debridement of remaining teeth;  Surgeon: [name], DDS;  Location: [place];  Service: Oral  Surgery;  Laterality: N/A;  . PATENT FORAMEN OVALE(PFO) CLOSURE N/A 02/18/2018   Procedure: PATENT FORAMEN OVALE (PFO) CLOSURE;  Surgeon: Rexene Alberts, MD;  Location: [place];  Service: Open Heart Surgery;  Laterality: N/A;  . RIGHT/LEFT HEART CATH AND CORONARY ANGIOGRAPHY N/A 07/03/2017   Procedure: RIGHT/LEFT HEART CATH AND CORONARY ANGIOGRAPHY;  Surgeon: Larey Dresser, MD;  Location: [place] [facility];  Service: Cardiovascular;  Laterality: N/A;  . TEE WITHOUT CARDIOVERSION N/A 10/08/2017   Procedure: TRANSESOPHAGEAL ECHOCARDIOGRAM (TEE);  Surgeon: Larey Dresser, MD;  Location[facility];  Service: Cardiovascular;  Laterality: N/A;  . TEE WITHOUT CARDIOVERSION N/A 02/18/2018   Procedure: TRANSESOPHAGEAL ECHOCARDIOGRAM (TEE);  Surgeon: Rexene Alberts, MD;  Location: [place];  Service: Open Heart Surgery;  Laterality: N/A;  . TONSILLECTOMY    . TUBAL LIGATION     Family History  Family History  Problem Relation Age of Onset  . Hypertension Mother    Social History  reports that she has quit smoking. Her smoking use included cigarettes. She has never used smokeless tobacco. She reports that she does not drink alcohol or use drugs. Allergies  Allergies  Allergen Reactions  . Ace Inhibitors Anaphylaxis and Swelling  . Motrin Ib [Ibuprofen] Anaphylaxis   Home medications Prior to Admission medications   Medication Sig Start Date End Date Taking? Authorizing Provider  acetaminophen (TYLENOL) 325 MG tablet Take 2 tablets (650 mg total) by mouth every 6 (six) hours as needed for mild pain, fever or headache. 04/23/17  Yes Allie Bossier, MD  albuterol (PROVENTIL HFA;VENTOLIN HFA) 108 (90 Base) MCG/ACT inhaler Inhale 2 puffs into the lungs every 6 (six) hours as needed for wheezing or shortness of breath.   Yes [provider]  ALPRAZolam (XANAX) 0.25 MG tablet Take 1 tablet (0.25 mg total) by mouth at bedtime as needed for anxiety. 04/16/18  Yes Georgiana Shore, NP   amLODipine (NORVASC) 10 MG tablet Take 1 tablet (10 mg total) by mouth daily. 04/24/17  Yes Allie Bossier, MD  aspirin EC 81 MG EC tablet Take 1 tablet (81 mg total) by mouth daily. 04/24/17  Yes Allie Bossier, MD  atorvastatin (LIPITOR) 80 MG tablet Take 1 tablet (80 mg total) by mouth daily. 03/06/18  Yes Barrett, Erin R, PA-C  b complex vitamins tablet Take 1 tablet by mouth daily.   Yes [provider]  buPROPion (WELLBUTRIN) 100 MG tablet Take 100 mg by mouth 3 (three) times daily.   Yes [provider]  busPIRone (BUSPAR) 15 MG tablet Take 1 tablet (15 mg total) by mouth 2 (two) times daily. 04/23/17  Yes Allie Bossier, MD  carvedilol (COREG) 6.25 MG tablet Take 6.25 mg by mouth 2 (two) times daily.   Yes [provider]  cholecalciferol (VITAMIN D) 1000 units tablet Take 1,000 Units by mouth daily.   Yes [provider]  doxazosin (CARDURA) 8 MG tablet Take 8 mg (1 tab) in am and 4 mg (1/2 tab) in pm Patient taking differently: Take 4-8 mg by mouth See admin instructions. Take 1 tablet every morning and take 1/2 tablet every evening 03/11/18  Yes Clegg, Amy D, NP  DULoxetine (CYMBALTA) 30 MG capsule Take 1 capsule (30 mg total) by mouth daily. 04/24/17  Yes Allie Bossier, MD  gabapentin (NEURONTIN) 300 MG capsule Take 300 mg by mouth at bedtime. 10/02/17  Yes [provider]  hydrALAZINE (APRESOLINE) 100 MG tablet Take 1 tablet (100 mg total) by mouth every 8 (eight) hours. 03/05/18  Yes Barrett, Erin R, PA-C  isosorbide mononitrate (IMDUR) 120 MG 24 hr tablet Take 1 tablet (120 mg total) by mouth daily. 04/24/17  Yes Allie Bossier, MD  levothyroxine (SYNTHROID, LEVOTHROID) 112 MCG tablet Take 112 mcg by mouth daily before breakfast.   Yes [provider]  montelukast (SINGULAIR) 10 MG tablet Take 1 tablet (10 mg total) by mouth daily. 04/24/17  Yes Allie Bossier, MD  Nutritional Supplements (CARNATION BREAKFAST ESSENTIALS PO) Take  237 mLs by mouth 2 (two) times daily.   Yes [provider]  oxyCODONE (OXY IR/ROXICODONE) 5 MG immediate release tablet Take 1 tablet (5 mg total) by mouth every 4 (four) hours as needed for severe pain. 03/05/18  Yes Barrett, Erin R, PA-C  pantoprazole (PROTONIX) 40 MG tablet Take 1 tablet (40 mg total) by mouth 2 (two) times daily before a meal. 04/23/17  Yes Allie Bossier, MD  potassium chloride SA (K-DUR,KLOR-CON) 20 MEQ tablet Take 20 mEq by mouth daily.   Yes [provider]  torsemide (DEMADEX) 20 MG tablet Take 100 mg in am and 60 mg in pm Patient taking differently: Take 60-100 mg by mouth See admin instructions. Take 5 tablets every morning then take 3 tablets every evening 04/02/18  Yes Larey Dresser, MD  traZODone (DESYREL) 25 mg TABS tablet Take 25 mg by mouth at bedtime.   Yes [provider]  warfarin (COUMADIN) 2.5 MG tablet Take 1 tablet (2.5 mg total) by mouth daily at 6 PM. 03/05/18  Yes Barrett, Erin R, PA-C  metolazone (ZAROXOLYN) 2.5 MG tablet Take 1 tablet (2.5 mg total) by mouth 2 (two) times a week. 04/19/18 07/18/18  Georgiana Shore, NP  ondansetron (ZOFRAN) 4 MG tablet Take 1 tablet (4 mg total) by mouth every 8 (eight) hours as needed for nausea or vomiting. Patient not taking: Reported on 04/17/2018 04/23/17 04/23/18  Allie Bossier, MD   Liver Function Tests Recent Labs  Lab 04/16/18 1805  AST 16  ALT 15  ALKPHOS 76  BILITOT 0.5  PROT 6.3*  ALBUMIN 3.3*   No results for input(s): LIPASE, AMYLASE in the last 168 hours. CBC Recent Labs  Lab 04/16/18 1805  WBC 6.9  HGB 8.7*  HCT 26.9*  MCV 76.2*  PLT 408   Basic Metabolic Panel Recent Labs  Lab 04/16/18 1303 04/16/18 1805  NA 136 136  K 4.8 4.7  CL 104 100  CO2 24 21*  GLUCOSE 92 229*  BUN 60* 59*  CREATININE 6.00* 6.03*  CALCIUM 9.3 9.4   Iron/TIBC/Ferritin/ %Sat    Component Value Date/Time  IRON 120 02/10/2018 1534   TIBC 245 (L) 02/10/2018 1534   FERRITIN  98 04/17/2018 0942   IRONPCTSAT 49 (H) 02/10/2018 1534    Vitals:   04/17/18 1132 04/17/18 1133 04/17/18 1200 04/17/18 1300  BP: (!) 155/80  (!) 152/85 (!) 160/93  Pulse: 77  76 81  Resp:  14 16 19   Temp:      TempSrc:      SpO2: 94%  97% 98%  Weight:       Exam Gen alert, thin, no distress, WDWN No rash, cyanosis or gangrene Sclera anicteric, throat clear  +JVD to mid neck 45deg Chest faint bibasilar rales RRR 2/6 sem no RG Abd soft ntnd no mass or ascites +bs some flank edema GU defer MS no joint effusions or deformity Ext 1-2+ R>L bilat LE pitting edema, no wounds or ulcer Neuro is alert, Ox 3 , nf, +myoclonic jerking of UE's, mild and intermittent    Home meds:  - amlodipine 10/ carvedilol 6.25 bid/ doxazosin 8 am+ 4 pm/ hydralazine 100 tid/ metolazone 2.5 biw/ torsemide 100 am+ 60 pm/ Kdur 20 qd  - alprazolam 0.25 hs prn/ bupropion 100 tid/ buspirone 15 bid/ duloxetine 30 qd/ oxy IR 5mg  qid prn/ trazodone 25 hs  - warfarin 2.5 qd  - pantoprazole 40/ montelukast 10 qd/ levothyroxine 112 ug  - isosorbide mononitrate 120 qd/ aspirin 81 qd/ atorvastatin 80 qd    Impression/ Plan: 1. CKD stage V - w/ uremic symptoms and volume overload. Progressive CKD, now end-stage.  Will give high dose IV lasix today and tomorrow and plan to start dialysis the first of the week.  Will consult IR for TDC.  Will need perm access as well later in the week when stabilized (she says has had vein map recently).  2. CAD hx CABG/ Maze/ PFO closure/ BIV pacemaker/ MV replacement - in Aug 2019 3. Vol overload - IV lasix 120 q 8 4. HTN - BP's up, cont meds, diurese 5. AFib - have dc'd coumadin and asked pharm to see for IV heparin and getting INR down by Monday for procedure 6. Depression / anxiety - mult meds 7. Anemia ckd - Hb 8's, check fe tibc     Kelly Splinter MD [place] pager [phone]   04/17/2018, 4:14 PM

## 2018-04-17 NOTE — Progress Notes (Addendum)
FPTS Interim Progress Note  S: Patient still in ED due to lack of room availability. She notes she is doing fine. Has a headache that is located all over. Denies any focal weakness, double vision, or slurred speech. Has not treated with anything. Also notes she is hungry. Patient informed me she has a daughter that lives in the same complex as her that comes over multiple times a day to help fix her breakfast and lunch and manage her medications.   O: Alert and oriented. Neuro exam benign. Radial pulses 2+ bilaterally.  BP (!) 161/88   Pulse 74   Temp 98.6 F (37 C) (Oral)   Resp 14   Wt 65.2 kg   SpO2 97%   BMI 21.23 kg/m     A/P: Continue work-up as outlined in H&P Re-consulted cardiology, will follow-up Recommended Tylenol PRN for headache   Jocelyn Hefty, DO 04/17/2018, 8:23 AM PGY-1, [place] Medicine Service pager [phone]

## 2018-04-17 NOTE — ED Notes (Signed)
Attempted to call report

## 2018-04-18 ENCOUNTER — Encounter (HOSPITAL_COMMUNITY): Payer: Self-pay | Admitting: Physician Assistant

## 2018-04-18 ENCOUNTER — Inpatient Hospital Stay (HOSPITAL_COMMUNITY): Payer: Medicare Other

## 2018-04-18 DIAGNOSIS — I5030 Unspecified diastolic (congestive) heart failure: Secondary | ICD-10-CM

## 2018-04-18 LAB — IRON AND TIBC
Iron: 18 ug/dL — ABNORMAL LOW (ref 28–170)
Saturation Ratios: 8 % — ABNORMAL LOW (ref 10.4–31.8)
TIBC: 224 ug/dL — ABNORMAL LOW (ref 250–450)
UIBC: 206 ug/dL

## 2018-04-18 LAB — BASIC METABOLIC PANEL
Anion gap: 10 (ref 5–15)
BUN: 56 mg/dL — ABNORMAL HIGH (ref 8–23)
CO2: 24 mmol/L (ref 22–32)
Calcium: 9.1 mg/dL (ref 8.9–10.3)
Chloride: 102 mmol/L (ref 98–111)
Creatinine, Ser: 6.17 mg/dL — ABNORMAL HIGH (ref 0.44–1.00)
GFR calc Af Amer: 7 mL/min — ABNORMAL LOW (ref >60)
GFR calc non Af Amer: 6 mL/min — ABNORMAL LOW (ref >60)
Glucose, Bld: 110 mg/dL — ABNORMAL HIGH (ref 70–99)
Potassium: 3.9 mmol/L (ref 3.5–5.1)
Sodium: 136 mmol/L (ref 135–145)

## 2018-04-18 LAB — MRSA PCR SCREENING: MRSA by PCR: NEGATIVE

## 2018-04-18 LAB — PROTIME-INR
INR: 1.54
Prothrombin Time: 18.3 seconds — ABNORMAL HIGH (ref 11.4–15.2)

## 2018-04-18 LAB — ECHOCARDIOGRAM COMPLETE
Height: 69 in
Weight: 2196.8 oz

## 2018-04-18 LAB — CBC
HCT: 27.3 % — ABNORMAL LOW (ref 36.0–46.0)
Hemoglobin: 9 g/dL — ABNORMAL LOW (ref 12.0–15.0)
MCH: 24.4 pg — ABNORMAL LOW (ref 26.0–34.0)
MCHC: 33 g/dL (ref 30.0–36.0)
MCV: 74 fL — ABNORMAL LOW (ref 80.0–100.0)
Platelets: 292 10*3/uL (ref 150–400)
RBC: 3.69 MIL/uL — ABNORMAL LOW (ref 3.87–5.11)
RDW: 14.2 % (ref 11.5–15.5)
WBC: 7.5 10*3/uL (ref 4.0–10.5)
nRBC: 0 % (ref 0.0–0.2)

## 2018-04-18 LAB — HEPARIN LEVEL (UNFRACTIONATED)
Heparin Unfractionated: <0.1 IU/mL — ABNORMAL LOW (ref 0.30–0.70)
Heparin Unfractionated: 0.21 IU/mL — ABNORMAL LOW (ref 0.30–0.70)
Heparin Unfractionated: 0.58 IU/mL (ref 0.30–0.70)

## 2018-04-18 MED ORDER — DARBEPOETIN ALFA 60 MCG/0.3ML IJ SOSY: 60.0000 ug | PREFILLED_SYRINGE | Freq: Once | INTRAMUSCULAR | Status: DC

## 2018-04-18 MED ORDER — LORAZEPAM 2 MG/ML IJ SOLN
INTRAMUSCULAR | Status: AC
  Administered 2018-04-18: 1 mg via INTRAVENOUS_CENTRAL
  Filled 2018-04-18: qty 1

## 2018-04-18 MED ORDER — CHLORHEXIDINE GLUCONATE CLOTH 2 % EX PADS
6.0000 | MEDICATED_PAD | Freq: Every day | CUTANEOUS | Status: DC
  Administered 2018-04-19 – 2018-04-25 (×3): 6 via TOPICAL

## 2018-04-18 MED ORDER — AMLODIPINE BESYLATE 10 MG PO TABS
10.0000 mg | ORAL_TABLET | Freq: Every day | ORAL | Status: DC
  Administered 2018-04-18 – 2018-04-22 (×5): 10 mg via ORAL
  Filled 2018-04-18 (×5): qty 1

## 2018-04-18 MED ORDER — LIDOCAINE HCL 2 % IJ SOLN
INTRAMUSCULAR | Status: AC
  Administered 2018-04-18: 16:00:00
  Filled 2018-04-18: qty 20

## 2018-04-18 MED ORDER — ALPRAZOLAM 0.25 MG PO TABS
0.2500 mg | ORAL_TABLET | Freq: Three times a day (TID) | ORAL | Status: DC | PRN
  Administered 2018-04-18 – 2018-04-21 (×5): 0.25 mg via ORAL
  Filled 2018-04-18 (×5): qty 1

## 2018-04-18 MED ORDER — SODIUM CHLORIDE 0.9 % IV SOLN
125.0000 mg | INTRAVENOUS | Status: DC
  Filled 2018-04-18 (×3): qty 10

## 2018-04-18 MED ORDER — ALPRAZOLAM 0.25 MG PO TABS: 0.2500 mg | ORAL_TABLET | Freq: Every evening | ORAL | Status: DC | PRN

## 2018-04-18 MED ORDER — CHLORHEXIDINE GLUCONATE CLOTH 2 % EX PADS
6.0000 | MEDICATED_PAD | Freq: Every day | CUTANEOUS | Status: DC
  Administered 2018-04-19 – 2018-04-27 (×3): 6 via TOPICAL

## 2018-04-18 MED ORDER — ALPRAZOLAM 0.25 MG PO TABS
0.2500 mg | ORAL_TABLET | Freq: Once | ORAL | Status: AC | PRN
  Administered 2018-04-18: 0.25 mg via ORAL
  Filled 2018-04-18: qty 1

## 2018-04-18 NOTE — Evaluation (Signed)
Physical Therapy Evaluation Patient Details Name: Jocelyn Hill MRN: [id number] DOB: February 01, 1951 Today's Date: 04/18/2018   History of Present Illness  67 yo female admitted with CP leg edema and SOB. pt with positive for weight gain noted PMH: 01/2018 CABG/ MVR/ PFO/ BIV Pacer,   Clinical Impression   Pt admitted with above diagnosis. Pt currently with functional limitations due to the deficits listed below (see PT Problem List). Independent in household ambulation prior to admission, prn assist from daughter for IADLs; Presents with decr activity tolerance, generalized weakness; Pt will benefit from skilled PT to increase their independence and safety with mobility to allow discharge to the venue listed below.       Follow Up Recommendations SNF    Equipment Recommendations  Rolling walker with 5" wheels;3in1 (PT)    Recommendations for Other Services       Precautions / Restrictions Precautions Precautions: Fall      Mobility  Bed Mobility Overal bed mobility: Needs Assistance Bed Mobility: Supine to Sit     Supine to sit: Min guard     General bed mobility comments: [place] for safety  Transfers Overall transfer level: Needs assistance Equipment used: Rolling walker (2 wheeled) Transfers: Sit to/from Stand Sit to Stand: Min guard         General transfer comment: Cues for hand placement, as she tends to pull up on RW  Ambulation/Gait Ambulation/Gait assistance: Min guard Gait Distance (Feet): (Hallway ambulation) Assistive device: Rolling walker (2 wheeled) Gait Pattern/deviations: Step-through pattern;Decreased step length - right;Decreased step length - left Gait velocity: slowed   General Gait Details: Cues to self-monitor for activity tolerance  Stairs            Wheelchair Mobility    Modified Rankin (Stroke Patients Only)       Balance     Sitting balance-Leahy Scale: Good     Standing balance support: Single extremity  supported;During functional activity Standing balance-Leahy Scale: Fair Standing balance comment: pt at times reaching for environmental surface for support                             Pertinent Vitals/Pain Pain Assessment: Faces Faces Pain Scale: Hurts a little bit Pain Location: chest Pain Descriptors / Indicators: Aching Pain Intervention(s): Monitored during session    Home Living Family/patient expects to be discharged to:: Skilled nursing facility Living Arrangements: Alone Available Help at Discharge: Family;Available 24 hours/day Type of Home: Apartment Home Access: Level entry     Home Layout: One level Home Equipment: Walker - 2 wheels Additional Comments: daughter able to stay PRN    Prior Function Level of Independence: Needs assistance   Gait / Transfers Assistance Needed: walks without AD  ADL's / Homemaking Assistance Needed: daughter does the cooking, cleaning, grocery shopping  Comments: daughter or grandkids can stay with pt. Drives short distances     Hand Dominance   Dominant Hand: Right    Extremity/Trunk Assessment   Upper Extremity Assessment Upper Extremity Assessment: Defer to OT evaluation    Lower Extremity Assessment Lower Extremity Assessment: Generalized weakness    Cervical / Trunk Assessment Cervical / Trunk Assessment: Kyphotic  Communication   Communication: No difficulties  Cognition Arousal/Alertness: Awake/alert Behavior During Therapy: WFL for tasks assessed/performed Overall Cognitive Status: Within Functional Limits for tasks assessed  General Comments General comments (skin integrity, edema, etc.): VSS    Exercises     Assessment/Plan    PT Assessment Patient needs continued PT services  PT Problem List Decreased strength;Decreased range of motion;Decreased activity tolerance;Decreased balance;Decreased mobility;Decreased knowledge of use of  DME;Decreased knowledge of precautions;Cardiopulmonary status limiting activity       PT Treatment Interventions DME instruction;Gait training;Stair training;Functional mobility training;Therapeutic activities;Therapeutic exercise;Balance training;Neuromuscular re-education;Cognitive remediation;Patient/family education    PT Goals (Current goals can be found in the Care Plan section)  Acute Rehab PT Goals Patient Stated Goal: to go back to rehab PT Goal Formulation: With patient Time For Goal Achievement: 05/02/18 Potential to Achieve Goals: Good    Frequency Min 2X/week   Barriers to discharge        Co-evaluation               AM-PAC PT "6 Clicks" Daily Activity  Outcome Measure Difficulty turning over in bed (including adjusting bedclothes, sheets and blankets)?: A Little Difficulty moving from lying on back to sitting on the side of the bed? : A Little Difficulty sitting down on and standing up from a chair with arms (e.g., wheelchair, bedside commode, etc,.)?: A Lot Help needed moving to and from a bed to chair (including a wheelchair)?: A Little Help needed walking in hospital room?: A Little Help needed climbing 3-5 steps with a railing? : A Lot 6 Click Score: 16    End of Session Equipment Utilized During Treatment: Gait belt Activity Tolerance: Patient tolerated treatment well Patient left: in bed;with call bell/phone within reach;with family/visitor present Nurse Communication: Mobility status PT Visit Diagnosis: Other abnormalities of gait and mobility (R26.89);Muscle weakness (generalized) (M62.81)    Time: 1423-1500 PT Time Calculation (min) (ACUTE ONLY): 37 min   Charges:   PT Evaluation $PT Eval Moderate Complexity: 1 Mod PT Treatments $Gait Training: 8-22 mins        Roney Marion, PT  Acute Rehabilitation Services Pager [phone] Office [phone]   Colletta [place] 04/18/2018, 4:28 PM

## 2018-04-18 NOTE — Progress Notes (Signed)
CSW acknowledging consult that patient interested in SNF. Per chart review, had been in contact with [facility] SW about long term care. Patient currently admitted with worsening renal failure, need for new dialysis. CSW completed referral and faxed to [facility], where patient had been placed before. Will not get a response from admissions until tomorrow. [facility] will need to request authorization through [facility] Medicare to see if patient will qualify for SNF placement again.  CSW to follow.  Laveda Abbe, [place] Clinical Social Worker [phone]

## 2018-04-18 NOTE — Progress Notes (Signed)
[place] for heparin Indication: atrial fibrillation  Allergies  Allergen Reactions  . Ace Inhibitors Anaphylaxis and Swelling  . Motrin Ib [Ibuprofen] Anaphylaxis    Patient Measurements: Height: 5\' 9"  (175.3 cm) Weight: 137 lb 4.8 oz (62.3 kg) IBW/kg (Calculated) : 66.2 Heparin Dosing Weight: 65.2 kg   Vital Signs: Temp: 98.4 F (36.9 C) (10/20 1148) Temp Source: Oral (10/20 1148) BP: 147/83 (10/20 1148) Pulse Rate: 85 (10/20 1148)  Labs: Recent Labs    04/16/18 1303 04/16/18 1805  04/17/18 0320 04/17/18 0942 04/17/18 1416 04/18/18 0229 04/18/18 0717 04/18/18 1136  HGB  --  8.7*  --   --   --   --  9.0*  --   --   HCT  --  26.9*  --   --   --   --  27.3*  --   --   PLT  --  281  --   --   --   --  292  --   --   LABPROT  --   --   --   --  21.5*  --   --  18.3*  --   INR  --   --   --   --  1.89  --   --  1.54  --   HEPARINUNFRC  --   --   --   --   --   --  <0.10*  --  0.21*  CREATININE 6.00* 6.03*  --   --   --   --  6.17*  --   --   TROPONINI  --  0.07*   < > 0.07* 0.07* 0.06*  --   --   --    < > = values in this interval not displayed.    Estimated Creatinine Clearance: 8.8 mL/min (A) (by C-G formula based on SCr of 6.17 mg/dL (H)).   Medical History: Past Medical History:  Diagnosis Date  . Anemia    low iron  . Anxiety   . Arthritis   . Bronchitis   . CHF (congestive heart failure) ([place])   . CKD (chronic kidney disease) stage 4, GFR 15-29 ml/min (HCC) 03/16/2017  . Coronary artery disease   . Depression   . Diabetes mellitus without complication ([place])   . Diet-controlled diabetes mellitus ([place])   . Ganglion cyst    left wrist  . GERD (gastroesophageal reflux disease)   . Headache    chronic headaches  . Heart failure ([place])   . Hyperlipidemia   . Hypertension   . Hypothyroidism   . Mitral regurgitation   . Myocardial infarction ([place])    3x last one 2008  . PAF (paroxysmal atrial fibrillation)  ([place])   . Pneumonia    x 2  . S/P Maze operation for atrial fibrillation 02/18/2018   Complete bilateral atrial lesion set using bipolar radiofrequency and cryothermy ablation with clipping of LA appendage  . S/P mitral valve replacement with bioprosthetic valve 02/18/2018   [facility] Mitral stented bovine pericardial tissue valve Model 7300 TFX Serial # [id number] Size 29  . Stroke [facility])    no lasting residual - ? 2014  . Umbilical hernia     Medications:  Scheduled:  . amLODipine  10 mg Oral Daily  . aspirin EC  81 mg Oral Daily  . atorvastatin  80 mg Oral Daily  . B-complex with vitamin C  1 tablet Oral Daily  . buPROPion  100 mg Oral TID  . busPIRone  15 mg Oral BID  . carvedilol  6.25 mg Oral BID  . cholecalciferol  1,000 Units Oral Daily  . doxazosin  8 mg Oral Daily   And  . doxazosin  4 mg Oral QHS  . DULoxetine  30 mg Oral Daily  . hydrALAZINE  100 mg Oral Q8H  . isosorbide mononitrate  120 mg Oral Daily  . levothyroxine  112 mcg Oral QAC breakfast  . montelukast  10 mg Oral Daily  . pantoprazole  40 mg Oral BID AC  . traZODone  25 mg Oral QHS    Assessment: 73 yof on warfarin PTA for Afib s/p recent bioprosthetic MVR planning to undergo procedures. Pt reported home regimen as 2.5 mg daily (last dose 10/17 PM).   INR decreased from 1.89 to 1.54. Plan to continue to hold warfarin so INR remains < 1.7 by Monday AM for TDC by IR. Heparin level this afternoon came back subtherapeutic at 0.21, on 1000 units/hr. Hgb 9, plt 292. No s/sx of bleeding.   Goal of Therapy:  INR 2-3 Heparin level 0.3-0.7 units/ml Monitor platelets by anticoagulation protocol: Yes   Plan:  Increase heparin infusion to 1100 units/hr Order heparin level in 8 hours Monitor daily HL, CBC, and for s/sx of bleeding  Doylene Canard, PharmD Clinical Pharmacist  Pager: [phone] Phone: [phone] 04/18/2018,12:43 PM

## 2018-04-18 NOTE — Evaluation (Signed)
Occupational Therapy Evaluation Patient Details Name: Jocelyn Hill MRN: [id number] DOB: 1950-09-15 Today's Date: 04/18/2018    History of Present Illness 67 yo female admitted with CP leg edema and SOB. pt with positive for weight gain noted. Pt now requires HD due to worsening renal failure and pending surgery for tunneled hemodialysis catheter. PMH: HTN, MI, PAF, CKD stage IV now progress, CAD sp CABG / BIV pacemaker / Maze / PFO / MV replacement done August 2019   Clinical Impression   PT admitted with renal failure with need to have HD started. Pt currently with functional limitiations due to the deficits listed below (see OT problem list). Pt currently with decr activity tolerance and fatigues requiring rest breaks. OT to continue to see patient for engery conservation with adls.  Pt will benefit from skilled OT to increase their independence and safety with adls and balance to allow discharge SNF. Pt requesting return to rehab at this time.     Follow Up Recommendations  SNF    Equipment Recommendations  None recommended by OT    Recommendations for Other Services       Precautions / Restrictions Precautions Precautions: Fall      Mobility Bed Mobility Overal bed mobility: Modified Independent                Transfers Overall transfer level: Needs assistance   Transfers: Sit to/from Stand Sit to Stand: Min guard         General transfer comment: pt with hand held (A) for transfer but able to compelte sit <stand without support    Balance Overall balance assessment: Needs assistance   Sitting balance-Leahy Scale: Good     Standing balance support: Single extremity supported;During functional activity Standing balance-Leahy Scale: Fair Standing balance comment: pt at times reaching for environmental surface for support                           ADL either performed or assessed with clinical judgement   ADL Overall ADL's : Needs  assistance/impaired Eating/Feeding: Independent   Grooming: Wash/dry hands   Upper Body Bathing: Supervision/ safety   Lower Body Bathing: Supervison/ safety   Upper Body Dressing : Supervision/safety   Lower Body Dressing: Supervision/safety Lower Body Dressing Details (indicate cue type and reason): requires seated position and rest breaks Toilet Transfer: Minimal assistance           Functional mobility during ADLs: Minimal assistance General ADL Comments: pt with coffee on her clothing on arrival. pt assisted to sink level for grooming/ bathing with change of clothing at thi stime. Rn notified of IV line in R sleeve  and to fix upon returning with medications     Vision         Perception     Praxis      Pertinent Vitals/Pain Pain Assessment: 0-10 Pain Score: 7  Pain Location: chest Pain Descriptors / Indicators: Sharp Pain Intervention(s): Monitored during session;Repositioned;Premedicated before session     Hand Dominance Right   Extremity/Trunk Assessment Upper Extremity Assessment Upper Extremity Assessment: Overall WFL for tasks assessed   Lower Extremity Assessment Lower Extremity Assessment: Defer to PT evaluation   Cervical / Trunk Assessment Cervical / Trunk Assessment: Kyphotic   Communication Communication Communication: No difficulties   Cognition Arousal/Alertness: Awake/alert Behavior During Therapy: WFL for tasks assessed/performed Overall Cognitive Status: Within Functional Limits for tasks assessed  General Comments  VSS- noted to have a small 30-45 seconds c/o chest pain 7 out 10    Exercises     Shoulder Instructions      Home Living Family/patient expects to be discharged to:: Skilled nursing facility Living Arrangements: Alone Available Help at Discharge: Family;Available 24 hours/day Type of Home: Apartment Home Access: Level entry     Home Layout: One level      Bathroom Shower/Tub: Teacher, early years/pre: Standard     Home Equipment: Environmental consultant - 2 wheels   Additional Comments: daughter able to stay PRN      Prior Functioning/Environment Level of Independence: Needs assistance    ADL's / Homemaking Assistance Needed: daughter does the cooking, cleaning, grocery shopping   Comments: daughter or grandkids can stay with pt. Drives short distances        OT Problem List: Decreased strength;Decreased activity tolerance;Decreased knowledge of use of DME or AE;Decreased safety awareness;Impaired balance (sitting and/or standing);Pain      OT Treatment/Interventions: Self-care/ADL training;Therapeutic activities;Therapeutic exercise;DME and/or AE instruction;Energy conservation;Patient/family education;Balance training    OT Goals(Current goals can be found in the care plan section) Acute Rehab OT Goals Patient Stated Goal: to go back to rehab OT Goal Formulation: With patient Time For Goal Achievement: 05/02/18 Potential to Achieve Goals: Good  OT Frequency: Min 2X/week   Barriers to D/C: Decreased caregiver support  times at home alone       Co-evaluation              AM-PAC PT "6 Clicks" Daily Activity     Outcome Measure Help from another person eating meals?: None Help from another person taking care of personal grooming?: None Help from another person toileting, which includes using toliet, bedpan, or urinal?: None Help from another person bathing (including washing, rinsing, drying)?: A Little Help from another person to put on and taking off regular upper body clothing?: None Help from another person to put on and taking off regular lower body clothing?: None 6 Click Score: 23   End of Session    Activity Tolerance: Patient tolerated treatment well Patient left: in chair;with call bell/phone within reach  OT Visit Diagnosis: Unsteadiness on feet (R26.81);Muscle weakness (generalized) (M62.81)                 Time: 2992-4268 OT Time Calculation (min): 19 min Charges:  OT General Charges $OT Visit: 1 Visit OT Evaluation $OT Eval Moderate Complexity: 1 Mod   Jocelyn Hill, OTR/L  Acute Rehabilitation Services Pager: [phone] Office: [phone] .   Parke Poisson B 04/18/2018, 11:04 AM

## 2018-04-18 NOTE — Progress Notes (Signed)
Progress Note  Patient Name: Jocelyn Hill Date of Encounter: 04/18/2018  Primary Cardiologist: [name], MD    Subjective   Lethargic indicates still with dyspnea sats 93% on [place]  Inpatient Medications    Scheduled Meds: . amLODipine  10 mg Oral Daily  . aspirin EC  81 mg Oral Daily  . atorvastatin  80 mg Oral Daily  . B-complex with vitamin C  1 tablet Oral Daily  . buPROPion  100 mg Oral TID  . busPIRone  15 mg Oral BID  . carvedilol  6.25 mg Oral BID  . cholecalciferol  1,000 Units Oral Daily  . doxazosin  8 mg Oral Daily   And  . doxazosin  4 mg Oral QHS  . DULoxetine  30 mg Oral Daily  . hydrALAZINE  100 mg Oral Q8H  . isosorbide mononitrate  120 mg Oral Daily  . levothyroxine  112 mcg Oral QAC breakfast  . montelukast  10 mg Oral Daily  . pantoprazole  40 mg Oral BID AC  . traZODone  25 mg Oral QHS   Continuous Infusions: . furosemide 120 mg (04/18/18 0444)  . heparin 1,000 Units/hr (04/18/18 0438)   PRN Meds: acetaminophen, albuterol, ALPRAZolam, oxyCODONE   Vital Signs    Vitals:   04/17/18 2145 04/18/18 0441 04/18/18 0442 04/18/18 0449  BP:   (!) 162/85   Pulse:  83 84   Resp:  17 14   Temp:  98.4 F (36.9 C)    TempSrc:  Oral    SpO2:  92% 93%   Weight: 65.3 kg   62.3 kg  Height: 5\' 9"  (1.753 m)       Intake/Output Summary (Last 24 hours) at 04/18/2018 0827 Last data filed at 04/18/2018 0700 Gross per 24 hour  Intake 604.33 ml  Output 2100 ml  Net -1495.67 ml   Filed Weights   04/16/18 1753 04/17/18 2145 04/18/18 0449  Weight: 65.2 kg 65.3 kg 62.3 kg    Telemetry    NSR  V pacing  - Personally Reviewed  ECG    Sinus V pacing  - Personally Reviewed  Physical Exam  Chronically ill female  GEN: No acute distress.   Neck: No JVD Cardiac: Increased PMI MR murmur  CRT device under left clavicle  Respiratory: Clear to auscultation bilaterally. GI: Soft, nontender, non-distended  MS: No edema; No deformity. Neuro:   Nonfocal  Psych: Normal affect  LE edema RLE worse than left   Labs    Chemistry Recent Labs  Lab 04/16/18 1303 04/16/18 1805 04/18/18 0229  NA 136 136 136  K 4.8 4.7 3.9  CL 104 100 102  CO2 24 21* 24  GLUCOSE 92 229* 110*  BUN 60* 59* 56*  CREATININE 6.00* 6.03* 6.17*  CALCIUM 9.3 9.4 9.1  PROT  --  6.3*  --   ALBUMIN  --  3.3*  --   AST  --  16  --   ALT  --  15  --   ALKPHOS  --  76  --   BILITOT  --  0.5  --   GFRNONAA 7* 7* 6*  GFRAA 8* 8* 7*  ANIONGAP 8 15 10      Hematology Recent Labs  Lab 04/16/18 1805 04/18/18 0229  WBC 6.9 7.5  RBC 3.53* 3.69*  HGB 8.7* 9.0*  HCT 26.9* 27.3*  MCV 76.2* 74.0*  MCH 24.6* 24.4*  MCHC 32.3 33.0  RDW 14.6 14.2  PLT 281  292    Cardiac Enzymes Recent Labs  Lab 04/16/18 2335 04/17/18 0320 04/17/18 0942 04/17/18 1416  TROPONINI 0.07* 0.07* 0.07* 0.06*   No results for input(s): TROPIPOC in the last 168 hours.   BNP Recent Labs  Lab 04/16/18 1805  BNP 2,442.0*     DDimer No results for input(s): DDIMER in the last 168 hours.   Radiology    Dg Chest 2 View  Result Date: 04/16/2018 CLINICAL DATA:  67 y/o  F; chest pain. EXAM: CHEST - 2 VIEW COMPARISON:  03/16/2018 chest radiograph. FINDINGS: Stable cardiomegaly given projection and technique. Mitral valve replacement and PFO closure. Post CABG with sternotomy wires in alignment. Stable 3 lead pacemaker. Small bilateral pleural effusions and chronic pulmonary vascular congestion. No consolidation or pneumothorax. Calcific aortic atherosclerosis. Bones are unremarkable. IMPRESSION: Stable small bilateral pleural effusions, pulmonary vascular congestion, and cardiomegaly. Electronically Signed   By: Kristine Garbe M.D.   On: 04/16/2018 23:36    Cardiac Studies   EF 45-50% Bioprosthetic MV mean gradient 3 mmHg   Patient Profile     67 y.o. female post CABG/MVR and Maze refractory CHF with progressive renal failure Cr Now over 6   Assessment &  Plan    CHF:  In NSR with CRT pacing on high dose lasix little improvement nephrology has seen needs Tunneled catheter and to start dialysis beginning of week. Still with LE edema right worse than left MVR:  Normal by post op echo SBE mean gradient 3 mmHg CAD:  Post CABG with LIMA to LAD and SVG to Lcx August 2019 during MVR no chest pain mild troponin Elevation from CHF and CRF  PAF:  Coumadin needs to be held for tunneled catheter can use heparin post MAZE and LA clip       For questions or updates, please contact [facility] Please consult [email or web address] for contact info under        Signed, Jenkins Rouge, MD  04/18/2018, 8:27 AM

## 2018-04-18 NOTE — Progress Notes (Signed)
Pt had a 7 beat run of vtach. Pt is asymptomatic. Will continue to monitor.

## 2018-04-18 NOTE — Progress Notes (Signed)
[place] for heparin Indication: atrial fibrillation  Allergies  Allergen Reactions  . Ace Inhibitors Anaphylaxis and Swelling  . Motrin Ib [Ibuprofen] Anaphylaxis    Patient Measurements: Height: 5\' 9"  (175.3 cm) Weight: 137 lb 4.8 oz (62.3 kg) IBW/kg (Calculated) : 66.2 Heparin Dosing Weight: 65.2 kg   Vital Signs: Temp: 99.7 F (37.6 C) (10/20 2024) Temp Source: Oral (10/20 2024) BP: 145/76 (10/20 2024) Pulse Rate: 90 (10/20 2024)  Labs: Recent Labs    04/16/18 1303 04/16/18 1805  04/17/18 0320 04/17/18 0942 04/17/18 1416 04/18/18 0229 04/18/18 0717 04/18/18 1136 04/18/18 2126  HGB  --  8.7*  --   --   --   --  9.0*  --   --   --   HCT  --  26.9*  --   --   --   --  27.3*  --   --   --   PLT  --  281  --   --   --   --  292  --   --   --   LABPROT  --   --   --   --  21.5*  --   --  18.3*  --   --   INR  --   --   --   --  1.89  --   --  1.54  --   --   HEPARINUNFRC  --   --   --   --   --   --  <0.10*  --  0.21* 0.58  CREATININE 6.00* 6.03*  --   --   --   --  6.17*  --   --   --   TROPONINI  --  0.07*   < > 0.07* 0.07* 0.06*  --   --   --   --    < > = values in this interval not displayed.    Estimated Creatinine Clearance: 8.8 mL/min (A) (by C-G formula based on SCr of 6.17 mg/dL (H)).   Medical History: Past Medical History:  Diagnosis Date  . Anemia    low iron  . Anxiety   . Arthritis   . Bronchitis   . CHF (congestive heart failure) ([place])   . CKD (chronic kidney disease) stage 4, GFR 15-29 ml/min (HCC) 03/16/2017  . Coronary artery disease   . Depression   . Diabetes mellitus without complication ([place])   . Diet-controlled diabetes mellitus ([place])   . Ganglion cyst    left wrist  . GERD (gastroesophageal reflux disease)   . Headache    chronic headaches  . Heart failure ([place])   . Hyperlipidemia   . Hypertension   . Hypothyroidism   . Mitral regurgitation   . Myocardial infarction ([place])    3x last  one 2008  . PAF (paroxysmal atrial fibrillation) ([place])   . Pneumonia    x 2  . S/P Maze operation for atrial fibrillation 02/18/2018   Complete bilateral atrial lesion set using bipolar radiofrequency and cryothermy ablation with clipping of LA appendage  . S/P mitral valve replacement with bioprosthetic valve 02/18/2018   [facility] Mitral stented bovine pericardial tissue valve Model 7300 TFX Serial # [id number] Size 29  . Stroke [facility])    no lasting residual - ? 2014  . Umbilical hernia     Medications:  Scheduled:  . amLODipine  10 mg Oral Daily  . aspirin EC  81 mg Oral  Daily  . atorvastatin  80 mg Oral Daily  . B-complex with vitamin C  1 tablet Oral Daily  . buPROPion  100 mg Oral TID  . busPIRone  15 mg Oral BID  . carvedilol  6.25 mg Oral BID  . [START ON 04/19/2018] Chlorhexidine Gluconate Cloth  6 each Topical Q0600  . [START ON 04/19/2018] Chlorhexidine Gluconate Cloth  6 each Topical Q0600  . cholecalciferol  1,000 Units Oral Daily  . doxazosin  8 mg Oral Daily   And  . doxazosin  4 mg Oral QHS  . DULoxetine  30 mg Oral Daily  . hydrALAZINE  100 mg Oral Q8H  . isosorbide mononitrate  120 mg Oral Daily  . levothyroxine  112 mcg Oral QAC breakfast  . montelukast  10 mg Oral Daily  . pantoprazole  40 mg Oral BID AC  . traZODone  25 mg Oral QHS    Assessment: 64 yof on warfarin PTA for Afib s/p recent bioprosthetic MVR planning to undergo procedures. Pt reported home regimen as 2.5 mg daily (last dose 10/17 PM).   INR decreased from 1.89 to 1.54. Plan to continue to hold warfarin so INR remains < 1.7 by Monday AM for TDC by IR.  Heparin level now therapeutic   Goal of Therapy:  INR 2-3 Heparin level 0.3-0.7 units/ml Monitor platelets by anticoagulation protocol: Yes   Plan:  Continue heparin infusion at 1100 units/hr Monitor daily HL, CBC, and for s/sx of bleeding  Thanks for allowing pharmacy to be a part of this patient's care.  Excell Seltzer,  PharmD Clinical Pharmacist

## 2018-04-18 NOTE — H&P (Signed)
Chief Complaint: Renal failure requiring hemodialysis  Referring Physician(s): Roney Jaffe  Supervising Physician: Corrie Mckusick  Patient Status: [facility] - In-pt  History of Present Illness: Jocelyn Hill is a 67 y.o. female with worsening renal function and now needs hemodialysis.  She has a history of HTN, MI, PAF, CKD stage IV now progress, CAD sp CABG / BIV pacemaker / Maze / PFO / MV replacement done August of this year.  She presented to the ED c/o chest pain, leg edema, shortness of breath and weight gain.  Labs done in the ED showed her creatnine was up at 6.0.  Her baseline in August was around 4.0- 5.0.    She was seen by Dr. Jonnie Finner and we are asked to place a tunneled hemodialysis catheter.  Her warfarin is being held and bridged with IV heparin.  Past Medical History:  Diagnosis Date  . Anemia    low iron  . Anxiety   . Arthritis   . Bronchitis   . CHF (congestive heart failure) ([place])   . CKD (chronic kidney disease) stage 4, GFR 15-29 ml/min (HCC) 03/16/2017  . Coronary artery disease   . Depression   . Diabetes mellitus without complication ([place])   . Diet-controlled diabetes mellitus ([place])   . Ganglion cyst    left wrist  . GERD (gastroesophageal reflux disease)   . Headache    chronic headaches  . Heart failure ([place])   . Hyperlipidemia   . Hypertension   . Hypothyroidism   . Mitral regurgitation   . Myocardial infarction ([place])    3x last one 2008  . PAF (paroxysmal atrial fibrillation) ([place])   . Pneumonia    x 2  . S/P Maze operation for atrial fibrillation 02/18/2018   Complete bilateral atrial lesion set using bipolar radiofrequency and cryothermy ablation with clipping of LA appendage  . S/P mitral valve replacement with bioprosthetic valve 02/18/2018   [facility] Mitral stented bovine pericardial tissue valve Model 7300 TFX Serial # [id number] Size 29  . Stroke [facility])    no lasting residual - ? 2014  . Umbilical hernia     Past  Surgical History:  Procedure Laterality Date  . ABDOMINAL HYSTERECTOMY     PARTIALS  . BIV PACEMAKER INSERTION CRT-P N/A 02/26/2018   Procedure: BIV PACEMAKER INSERTION CRT-P;  Surgeon: Constance Haw, MD;  Location: [place] [facility];  Service: Cardiovascular;  Laterality: N/A;  . CARDIAC CATHETERIZATION    . CESAREAN SECTION    . CORONARY ARTERY BYPASS GRAFT N/A 02/18/2018   Procedure: CORONARY ARTERY BYPASS GRAFTING (CABG) WITH IMA. ENDOSCOPIC VEIN HARVEST. IMA TO LAD, SVG TO CIRC.;  Surgeon: Rexene Alberts, MD;  Location: [place];  Service: Open Heart Surgery;  Laterality: N/A;  . INSERTION OF DIALYSIS CATHETER N/A 02/18/2018   Procedure: PLACEMENT OF TEMPORARY HD CATHETER, POWER TRIALYSIS 13FR 20CM;  Surgeon: Rexene Alberts, MD;  Location: [place];  Service: Vascular;  Laterality: N/A;  . IR NEPHRO TUBE REMOV/FL  04/02/2017  . IR NEPHROSTOGRAM RIGHT THRU EXISTING ACCESS  04/02/2017  . IR NEPHROSTOMY PLACEMENT RIGHT  03/27/2017  . IR THORACENTESIS ASP PLEURAL SPACE W/IMG GUIDE  03/24/2017  . MAZE N/A 02/18/2018   Procedure: MAZE;  Surgeon: Rexene Alberts, MD;  Location: [place];  Service: Open Heart Surgery;  Laterality: N/A;  . MITRAL VALVE REPLACEMENT N/A 02/18/2018   Procedure: MITRAL VALVE (MV) REPLACEMENT;  Surgeon: Rexene Alberts, MD;  Location:  [place] OR;  Service: Open Heart Surgery;  Laterality: N/A;  . MULTIPLE EXTRACTIONS WITH ALVEOLOPLASTY N/A 11/05/2017   Procedure: Extraction of tooth #'s 4,5,7,8,9,10,12,14 and 29 with alveoloplasty and gross debridement of remaining teeth;  Surgeon: [name], DDS;  Location: [place];  Service: Oral Surgery;  Laterality: N/A;  . PATENT FORAMEN OVALE(PFO) CLOSURE N/A 02/18/2018   Procedure: PATENT FORAMEN OVALE (PFO) CLOSURE;  Surgeon: Rexene Alberts, MD;  Location: [place];  Service: Open Heart Surgery;  Laterality: N/A;  . RIGHT/LEFT HEART CATH AND CORONARY ANGIOGRAPHY N/A 07/03/2017   Procedure: RIGHT/LEFT HEART CATH AND CORONARY  ANGIOGRAPHY;  Surgeon: Larey Dresser, MD;  Location: [place] [facility];  Service: Cardiovascular;  Laterality: N/A;  . TEE WITHOUT CARDIOVERSION N/A 10/08/2017   Procedure: TRANSESOPHAGEAL ECHOCARDIOGRAM (TEE);  Surgeon: Larey Dresser, MD;  Location: [facility] ENDOSCOPY;  Service: Cardiovascular;  Laterality: N/A;  . TEE WITHOUT CARDIOVERSION N/A 02/18/2018   Procedure: TRANSESOPHAGEAL ECHOCARDIOGRAM (TEE);  Surgeon: Rexene Alberts, MD;  Location: [place];  Service: Open Heart Surgery;  Laterality: N/A;  . TONSILLECTOMY    . TUBAL LIGATION      Allergies: Ace inhibitors and Motrin ib [ibuprofen]  Medications: Prior to Admission medications   Medication Sig Start Date End Date Taking? Authorizing Provider  acetaminophen (TYLENOL) 325 MG tablet Take 2 tablets (650 mg total) by mouth every 6 (six) hours as needed for mild pain, fever or headache. 04/23/17  Yes Allie Bossier, MD  albuterol (PROVENTIL HFA;VENTOLIN HFA) 108 (90 Base) MCG/ACT inhaler Inhale 2 puffs into the lungs every 6 (six) hours as needed for wheezing or shortness of breath.   Yes [provider]  ALPRAZolam (XANAX) 0.25 MG tablet Take 1 tablet (0.25 mg total) by mouth at bedtime as needed for anxiety. 04/16/18  Yes Georgiana Shore, NP  amLODipine (NORVASC) 10 MG tablet Take 1 tablet (10 mg total) by mouth daily. 04/24/17  Yes Allie Bossier, MD  aspirin EC 81 MG EC tablet Take 1 tablet (81 mg total) by mouth daily. 04/24/17  Yes Allie Bossier, MD  atorvastatin (LIPITOR) 80 MG tablet Take 1 tablet (80 mg total) by mouth daily. 03/06/18  Yes Barrett, Erin R, PA-C  b complex vitamins tablet Take 1 tablet by mouth daily.   Yes [provider]  buPROPion (WELLBUTRIN) 100 MG tablet Take 100 mg by mouth 3 (three) times daily.   Yes [provider]  busPIRone (BUSPAR) 15 MG tablet Take 1 tablet (15 mg total) by mouth 2 (two) times daily. 04/23/17  Yes Allie Bossier, MD  carvedilol (COREG) 6.25 MG tablet  Take 6.25 mg by mouth 2 (two) times daily.   Yes [provider]  cholecalciferol (VITAMIN D) 1000 units tablet Take 1,000 Units by mouth daily.   Yes [provider]  doxazosin (CARDURA) 8 MG tablet Take 8 mg (1 tab) in am and 4 mg (1/2 tab) in pm Patient taking differently: Take 4-8 mg by mouth See admin instructions. Take 1 tablet every morning and take 1/2 tablet every evening 03/11/18  Yes Clegg, Amy D, NP  DULoxetine (CYMBALTA) 30 MG capsule Take 1 capsule (30 mg total) by mouth daily. 04/24/17  Yes Allie Bossier, MD  gabapentin (NEURONTIN) 300 MG capsule Take 300 mg by mouth at bedtime. 10/02/17  Yes [provider]  hydrALAZINE (APRESOLINE) 100 MG tablet Take 1 tablet (100 mg total) by mouth every 8 (eight) hours. 03/05/18  Yes Barrett,  Erin R, PA-C  isosorbide mononitrate (IMDUR) 120 MG 24 hr tablet Take 1 tablet (120 mg total) by mouth daily. 04/24/17  Yes Allie Bossier, MD  levothyroxine (SYNTHROID, LEVOTHROID) 112 MCG tablet Take 112 mcg by mouth daily before breakfast.   Yes [provider]  montelukast (SINGULAIR) 10 MG tablet Take 1 tablet (10 mg total) by mouth daily. 04/24/17  Yes Allie Bossier, MD  Nutritional Supplements (CARNATION BREAKFAST ESSENTIALS PO) Take 237 mLs by mouth 2 (two) times daily.   Yes [provider]  oxyCODONE (OXY IR/ROXICODONE) 5 MG immediate release tablet Take 1 tablet (5 mg total) by mouth every 4 (four) hours as needed for severe pain. 03/05/18  Yes Barrett, Erin R, PA-C  pantoprazole (PROTONIX) 40 MG tablet Take 1 tablet (40 mg total) by mouth 2 (two) times daily before a meal. 04/23/17  Yes Allie Bossier, MD  potassium chloride SA (K-DUR,KLOR-CON) 20 MEQ tablet Take 20 mEq by mouth daily.   Yes [provider]  torsemide (DEMADEX) 20 MG tablet Take 100 mg in am and 60 mg in pm Patient taking differently: Take 60-100 mg by mouth See admin instructions. Take 5 tablets every morning then take 3 tablets  every evening 04/02/18  Yes Larey Dresser, MD  traZODone (DESYREL) 25 mg TABS tablet Take 25 mg by mouth at bedtime.   Yes [provider]  warfarin (COUMADIN) 2.5 MG tablet Take 1 tablet (2.5 mg total) by mouth daily at 6 PM. 03/05/18  Yes Barrett, Erin R, PA-C  metolazone (ZAROXOLYN) 2.5 MG tablet Take 1 tablet (2.5 mg total) by mouth 2 (two) times a week. 04/19/18 07/18/18  Georgiana Shore, NP  ondansetron (ZOFRAN) 4 MG tablet Take 1 tablet (4 mg total) by mouth every 8 (eight) hours as needed for nausea or vomiting. Patient not taking: Reported on 04/17/2018 04/23/17 04/23/18  Allie Bossier, MD     Family History  Problem Relation Age of Onset  . Hypertension Mother     Social History   Socioeconomic History  . Marital status: Divorced    Spouse name: Not on file  . Number of children: Not on file  . Years of education: Not on file  . Highest education level: Not on file  Occupational History  . Not on file  Social Needs  . Financial resource strain: Not on file  . Food insecurity:    Worry: Not on file    Inability: Not on file  . Transportation needs:    Medical: Not on file    Non-medical: Not on file  Tobacco Use  . Smoking status: Former Smoker    Types: Cigarettes  . Smokeless tobacco: Never Used  Substance and Sexual Activity  . Alcohol use: No  . Drug use: No    Comment: marijuana in the past  . Sexual activity: Not Currently  Lifestyle  . Physical activity:    Days per week: Not on file    Minutes per session: Not on file  . Stress: Not on file  Relationships  . Social connections:    Talks on phone: Not on file    Gets together: Not on file    Attends religious service: Not on file    Active member of club or organization: Not on file    Attends meetings of clubs or organizations: Not on file    Relationship status: Not on file  Other Topics Concern  . Not on file  Social  History Narrative  . Not on file     Review of Systems: A 12  point ROS discussed and pertinent positives are indicated in the HPI above.  All other systems are negative.  Review of Systems  Vital Signs: BP (!) 162/85   Pulse 84   Temp 98.4 F (36.9 C) (Oral)   Resp 14   Ht 5\' 9"  (1.753 m)   Wt 62.3 kg   SpO2 93%   BMI 20.28 kg/m   Physical Exam  Constitutional: She is oriented to person, place, and time. She appears well-developed.  HENT:  Head: Normocephalic and atraumatic.  Eyes: EOM are normal.  Neck: Normal range of motion.  Cardiovascular: Normal rate and regular rhythm.  Pulmonary/Chest: Effort normal and breath sounds normal. No respiratory distress.  Abdominal: Soft. There is no tenderness.  Musculoskeletal: Normal range of motion.  Neurological: She is oriented to person, place, and time.  Skin: Skin is warm and dry.  Psychiatric: She has a normal mood and affect. Her behavior is normal. Judgment and thought content normal.  Vitals reviewed.   Imaging: Dg Chest 2 View  Result Date: 04/16/2018 CLINICAL DATA:  67 y/o  F; chest pain. EXAM: CHEST - 2 VIEW COMPARISON:  03/16/2018 chest radiograph. FINDINGS: Stable cardiomegaly given projection and technique. Mitral valve replacement and PFO closure. Post CABG with sternotomy wires in alignment. Stable 3 lead pacemaker. Small bilateral pleural effusions and chronic pulmonary vascular congestion. No consolidation or pneumothorax. Calcific aortic atherosclerosis. Bones are unremarkable. IMPRESSION: Stable small bilateral pleural effusions, pulmonary vascular congestion, and cardiomegaly. Electronically Signed   By: Kristine Garbe M.D.   On: 04/16/2018 23:36    Labs:  CBC: Recent Labs    03/03/18 0415 03/11/18 1458 04/16/18 1805 04/18/18 0229  WBC 13.0* 7.0 6.9 7.5  HGB 8.2* 8.3* 8.7* 9.0*  HCT 24.4* 25.9* 26.9* 27.3*  PLT 452* 396 281 292    COAGS: Recent Labs    02/12/18 0253 02/12/18 1056 02/13/18 0229  02/18/18 1441  03/04/18 0407 03/05/18 0339  04/17/18 0942 04/18/18 0717  INR  --   --   --    < > 1.59   < > 2.07 2.11 1.89 1.54  APTT 90* 79* 32  --  37*  --   --   --   --   --    < > = values in this interval not displayed.    BMP: Recent Labs    04/02/18 1457 04/16/18 1303 04/16/18 1805 04/18/18 0229  NA 139 136 136 136  K 4.1 4.8 4.7 3.9  CL 103 104 100 102  CO2 25 24 21* 24  GLUCOSE 139* 92 229* 110*  BUN 40* 60* 59* 56*  CALCIUM 9.3 9.3 9.4 9.1  CREATININE 4.31* 6.00* 6.03* 6.17*  GFRNONAA 10* 7* 7* 6*  GFRAA 11* 8* 8* 7*    LIVER FUNCTION TESTS: Recent Labs    12/21/17 1528 01/11/18 1148  02/17/18 0353 02/25/18 0500  02/28/18 0427 03/01/18 0339 03/02/18 0346 04/16/18 1805  BILITOT 0.6 0.7  --  0.9 0.5  --   --   --   --  0.5  AST 19 20  --  16 35  --   --   --   --  16  ALT 17 15  --  15 31  --   --   --   --  15  ALKPHOS 101  --   --  96 79  --   --   --   --  76  PROT 7.0 6.7  --  7.3 5.5*  --   --   --   --  6.3*  ALBUMIN 3.8  --    < > 4.2 2.6*   < > 2.8* 2.9* 2.7* 3.3*   < > = values in this interval not displayed.    TUMOR MARKERS: No results for input(s): AFPTM, CEA, CA199, CHROMGRNA in the last 8760 hours.  Assessment and Plan:  Worsening renal function now in need of hemodialysis.  Will proceed with placement of tunneled HD catheter tomorrow.  Risks and benefits discussed with the patient including, but not limited to bleeding, infection, vascular injury, pneumothorax which may require chest tube placement, air embolism or even death  All of the patient's questions were answered, patient is agreeable to proceed. Consent signed and in chart.  Thank you for this interesting consult.  I greatly enjoyed meeting Jocelyn Hill and look forward to participating in their care.  A copy of this report was sent to the requesting provider on this date.  Electronically Signed: Murrell Redden, PA-C   04/18/2018, 10:25 AM      I spent a total of 20 Minutes in face to face in clinical  consultation, greater than 50% of which was counseling/coordinating care for tunneled HD catheter.

## 2018-04-18 NOTE — Progress Notes (Signed)
Family Medicine Teaching Service Daily Progress Note Intern Pager: [phone]  Patient name: Jocelyn Hill Medical record number: [id number] Date of birth: 1951-01-05 Age: 67 y.o. Gender: female  Primary Care Provider: [name], MD Consultants: Cardiology, Nephrology  Code Status: Full   Pt Overview and Major Events to Date:  Admitted to [facility] 04/17/2018  Assessment and Plan: Jocelyn Hill a 66 y.o.femalepresenting from cardiology officewith elevated creatinine and chest pain. PMH is significant forCHF, CKD IV, HTN,CAD s/p MI and CABG (01/2018),hypothyroidism, paroxysmalA-fibs/p MAZE (01/2018),mitral regurgitation s/p mitral valve replacement (01/2018), microcytic anemia, anxiety/depression.  HFrEF exacerbation  ARF in setting of CKD IV: Acute on chronic. Echo from 01/2018 showing EF 45-50%. EDW ~125-130. Current weight 137.3lb, down from 144lb on 10/19. Neg negative output since admission -1.0957L.  Slightly worsening Cr from 6.03 on 10/19 to 6.17 this morning, BL ~4. Cardiology consulted with recommendations of continuation of coreg, hydralazine, and imdur. Also recommended nephrology referral. Per nephrology recommendations continue IV lasix with plan to start dialysis on the first of the week. IR consulted by nephrology for [facility] with plans for future perm cath.  - Cardiology consulted, appreciate recommendations  - Nephrology consulted, appreciate recommendations  - IR consulted, appreciate recommendations - IV lasix 120 q8h - Daily weights - Strict I&O's - continue fluid restriction  - continue coreg, hydralazine, and imdur per cardiology recommendations  - continue to hold home Torsemide and metolazone   - Avoid nephrotoxic agents  Atypical ChestPain: Chronic. S/p CABG with LIMA-LAD and SVG-LCx on 01/2018. Atypical in that it does not radiate, not associated with exertion, and is intermittent noted to occur along incision line and at pacemaker site. Patient has  history of CAD s/p MI in August 2019 with CABG and biventricular pacemaker in place 2/2 to complete heart block. Troponins with low flat trend, per cardiology no c/w ACS.Likely demand ischemia in setting of acute CHF exacerbation and anemia and worsening renal function.  - Cardiology consulted, appreciate recommendations  - Continuous cardiac monitor  - Contiuous pulse ox with O2 PRN - Echo pending  - Continue Aspirin 81mg  - Continue home Atorvastatin, Coreg, doxazosin, hydralazine, Imdur, Warfarin  History of Mitral regurgitation s/p mitral valve replacement Chronic, stable. 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 Biprosthetic MV replacement in August 2019. Post-op echo was stable. - Echo pending   Atrial Fibrillation s/p MAZE Chronic, stable. MAZE procedure in August 2019. No recurrence since.  - Continue Warfarin   HTN: Remains hypertensive. AM BP of 162/. Will likely improve with diuresis. On home hydralazine, amlodipine, Coreg, doxazosin.  - Continue hydralazine, Coreg, and doxazosin - Can consider restarting home amlodipine given elevated pressures  - Monitor BP's  Microcytic Anemia: chroic, stable Currently at baseline. Hgb trend during admission 8.7>9.0 (BL 8 since August 2019. Anemia panel showing low iron of 18, TIBC low at 224, and low sat ratio of 8. Ferritin on 10/19 of 98. No signs of bleed. - Continue to monitor  Hypothyroidism:  TSH on 10/26/17: 2.884. On Synthroid. - Continue home synthroid.  GERD: - Continue home protonix 40mg  BID  Anxiety/Depression Chronic, stable. On home Wellbutrin, Cymbalta, Xanax PRN, and Trazodone.  ON patient requiring one dose of PRN xanax 2/2 anxiety.  - Continue home Cymbalta, Wellbutrin, and Trazodone - can consider adding back xanax PRN during admission if continued anxiety.   FEN/GI: Renal diet with fluid restriction  Prophylaxis: Warfarin  Disposition: continued inpatient stay for diuresis   Subjective:  Patient today  states she feels very "  stressed". States she is stressed 2/2 difficulty breathing and being in the hospital. She states she understands what is happening and the plans of primary team and various specialists but she is just worried. States xanax ON alleviated some of her stress. ON RN notes showing patient had 7 beat run of vtach, but was asymptomatic.   Objective: Temp:  [98.4 F (36.9 C)] 98.4 F (36.9 C) (10/20 0441) Pulse Rate:  [71-101] 84 (10/20 0442) Resp:  [14-25] 14 (10/20 0442) BP: (151-166)/(80-98) 162/85 (10/20 0442) SpO2:  [92 %-100 %] 93 % (10/20 0442) Weight:  [62.3 kg-65.3 kg] 62.3 kg (10/20 0449) Physical Exam: General: awake, laying in bed Cardiovascular: RRR, no MRG Respiratory: RRR, bibasilar crackles, speaking full sentences, no increased WOB Abdomen: soft, diffuse tender, non distended, bowel sounds present Extremities: RLE with 1+ pitting edema to shin  Laboratory: Recent Labs  Lab 04/16/18 1805 04/18/18 0229  WBC 6.9 7.5  HGB 8.7* 9.0*  HCT 26.9* 27.3*  PLT 281 292   Recent Labs  Lab 04/16/18 1303 04/16/18 1805 04/18/18 0229  NA 136 136 136  K 4.8 4.7 3.9  CL 104 100 102  CO2 24 21* 24  BUN 60* 59* 56*  CREATININE 6.00* 6.03* 6.17*  CALCIUM 9.3 9.4 9.1  PROT  --  6.3*  --   BILITOT  --  0.5  --   ALKPHOS  --  76  --   ALT  --  15  --   AST  --  16  --   GLUCOSE 92 229* 110*    Ref. Range 04/18/2018 02:29  Iron Latest Ref Range: 28 - 170 ug/dL 18 (L)  UIBC Latest Units: ug/dL 206  TIBC Latest Ref Range: 250 - 450 ug/dL 224 (L)  Saturation Ratios Latest Ref Range: 10.4 - 31.8 % 8 (L)    Imaging/Diagnostic Tests: Dg Chest 2 View  Result Date: 04/16/2018 CLINICAL DATA:  67 y/o  F; chest pain. EXAM: CHEST - 2 VIEW COMPARISON:  03/16/2018 chest radiograph. FINDINGS: Stable cardiomegaly given projection and technique. Mitral valve replacement and PFO closure. Post CABG with sternotomy wires in alignment. Stable 3 lead pacemaker. Small  bilateral pleural effusions and chronic pulmonary vascular congestion. No consolidation or pneumothorax. Calcific aortic atherosclerosis. Bones are unremarkable. IMPRESSION: Stable small bilateral pleural effusions, pulmonary vascular congestion, and cardiomegaly. Electronically Signed   By: Kristine Garbe M.D.   On: 04/16/2018 23:36     Caroline More, DO 04/18/2018, 7:03 AM PGY-2, [place] Intern pager: [phone], text pages welcome

## 2018-04-18 NOTE — NC FL2 (Signed)
[place] LEVEL OF CARE SCREENING TOOL     IDENTIFICATION  Patient Name: Jocelyn Hill Birthdate: [birth year] Sex: female Admission Date (Current Location): 04/16/2018  [facility] and [place] Number:  [employer] and Address:  [facility] [place], [place] [address], [place], [place] [address]      Provider Number: [id number]  Attending Physician Name and Address:  Dickie La, MD  Relative Name and Phone Number:       Current Level of Care: Hospital Recommended Level of Care: [place] Prior Approval Number:    Date Approved/Denied:   PASRR Number: [id number]  Discharge Plan: SNF    Current Diagnoses: Patient Active Problem List   Diagnosis Date Noted  . CHF exacerbation ([place]) 04/17/2018  . CKD (chronic kidney disease) stage V requiring chronic dialysis ([place])   . Pleural effusion   . S/P biventricular cardiac pacemaker procedure 03/05/2018  . S/P CABG x 2 02/18/2018  . S/P mitral valve replacement with bioprosthetic valve + CABG x2 + maze procedure 02/18/2018  . S/P patent foramen ovale closure 02/18/2018  . S/P Maze operation for atrial fibrillation 02/18/2018  . Malnutrition of moderate degree 02/11/2018  . Chest pain due to CAD ([place]) 02/09/2018  . AF (paroxysmal atrial fibrillation) ([place])   . Coronary artery disease involving native coronary artery of native heart   . Elevated troponin   . Rheumatic mitral regurgitation   . Acute renal failure superimposed on stage 4 chronic kidney disease ([place])   . Controlled type 2 diabetes mellitus with diabetic nephropathy ([place])   . Anxiety   . Protein-calorie malnutrition, severe 04/21/2017  . Substance abuse ([place])   . Pulmonary hypertension ([place])   . Anemia due to chronic kidney disease   . Controlled diabetes mellitus type 2 with complications ([place])   . Severe mitral regurgitation   . Stage 4 chronic kidney disease ([place])   . Microcytic anemia 03/13/2017  .  Hyperlipidemia 03/12/2017  . Hypertension 03/12/2017  . Hypothyroidism 03/12/2017  . AKI (acute kidney injury) ([place]) 03/12/2017  . Depression 03/12/2017  . CHF (congestive heart failure) ([place]) 03/12/2017  . Dysphagia 03/12/2017    Orientation RESPIRATION BLADDER Height & Weight     Self, Time, Situation, Place  Normal Continent Weight: 137 lb 4.8 oz (62.3 kg) Height:  5\' 9"  (175.3 cm)  BEHAVIORAL SYMPTOMS/MOOD NEUROLOGICAL BOWEL NUTRITION STATUS      Continent Diet(see DC summary)  AMBULATORY STATUS COMMUNICATION OF NEEDS Skin   Limited Assist Verbally Other (Comment)(wound on lower right arm; wound from IV on previous admission)                       Personal Care Assistance Level of Assistance  Bathing, Feeding, Dressing Bathing Assistance: Limited assistance Feeding assistance: Independent Dressing Assistance: Limited assistance     Functional Limitations Info  Sight, Hearing, Speech Sight Info: Adequate Hearing Info: Adequate Speech Info: Adequate    SPECIAL CARE FACTORS FREQUENCY  PT (By licensed PT), OT (By licensed OT)     PT Frequency: 5x/wk OT Frequency: 5x/wk            Contractures Contractures Info: Not present    Additional Factors Info  Code Status, Allergies Code Status Info: Full Allergies Info: Ace Inhibitors, Motrin Ib Ibuprofen           Current Medications (04/18/2018):  This is the current hospital active medication list Current Facility-Administered Medications  Medication  Dose Route Frequency Provider Last Rate Last Dose  . acetaminophen (TYLENOL) tablet 650 mg  650 mg Oral Q6H PRN Mullis, Kiersten P, DO   650 mg at 04/17/18 1017  . albuterol (PROVENTIL) (2.5 MG/3ML) 0.083% nebulizer solution 2.5 mg  2.5 mg Nebulization Q2H PRN Gays Bing, DO      . ALPRAZolam Duanne Moron) tablet 0.25 mg  0.25 mg Oral TID PRN Roney Jaffe, MD      . amLODipine (NORVASC) tablet 10 mg  10 mg Oral Daily Tammi Klippel, Sherin, DO   10 mg at 04/18/18  1004  . aspirin EC tablet 81 mg  81 mg Oral Daily Adrian Bing, DO   81 mg at 04/18/18 1037  . atorvastatin (LIPITOR) tablet 80 mg  80 mg Oral Daily [place] Bing, DO   80 mg at 04/18/18 9390  . B-complex with vitamin C tablet 1 tablet  1 tablet Oral Daily Blyn Bing, DO      . buPROPion [facility]) tablet 100 mg  100 mg Oral TID Hailey Bing, DO   100 mg at 04/17/18 2153  . busPIRone (BUSPAR) tablet 15 mg  15 mg Oral BID [place] Bing, DO   15 mg at 04/18/18 3009  . carvedilol (COREG) tablet 6.25 mg  6.25 mg Oral BID [place] Bing, DO   6.25 mg at 04/18/18 2330  . cholecalciferol (VITAMIN D) tablet 1,000 Units  1,000 Units Oral Daily Glenn Bing, DO   1,000 Units at 04/18/18 0762  . doxazosin (CARDURA) tablet 8 mg  8 mg Oral Daily Hammons, Kimberly B, RPH       And  . doxazosin (CARDURA) tablet 4 mg  4 mg Oral QHS Hammons, Kimberly B, RPH   4 mg at 04/17/18 2151  . DULoxetine (CYMBALTA) DR capsule 30 mg  30 mg Oral Daily Clarence Bing, DO   30 mg at 04/18/18 1005  . furosemide (LASIX) 120 mg in dextrose 5 % 50 mL IVPB  120 mg Intravenous Q8H Roney Jaffe, MD 62 mL/hr at 04/18/18 0444 120 mg at 04/18/18 0444  . heparin ADULT infusion 100 units/mL (25000 units/219mL sodium chloride 0.45%)  1,000 Units/hr Intravenous Continuous Laren Everts, RPH 10 mL/hr at 04/18/18 0438 1,000 Units/hr at 04/18/18 0438  . hydrALAZINE (APRESOLINE) tablet 100 mg  100 mg Oral Q8H [place] Bing, DO   100 mg at 04/18/18 0448  . isosorbide mononitrate (IMDUR) 24 hr tablet 120 mg  120 mg Oral Daily Elbert Bing, DO   120 mg at 04/18/18 1001  . levothyroxine (SYNTHROID, LEVOTHROID) tablet 112 mcg  112 mcg Oral QAC breakfast Echo Bing, DO   112 mcg at 04/18/18 2633  . montelukast (SINGULAIR) tablet 10 mg  10 mg Oral Daily [place] Bing, DO   10 mg at 04/18/18 1002  . oxyCODONE (Oxy IR/ROXICODONE) immediate release tablet 5 mg  5 mg Oral Q4H PRN Alta Bing, DO   5 mg at 04/18/18 1040  . pantoprazole (PROTONIX) EC tablet 40 mg  40 mg Oral BID AC [place] Bing, DO   40 mg at 04/18/18 1003  . traZODone (DESYREL) tablet 25 mg  25 mg Oral QHS [place] Bing, DO   25 mg at 04/17/18 2150     Discharge Medications: Please see discharge summary for a list of discharge medications.  Relevant Imaging Results:  Relevant Lab Results:   Additional Information SS#: [id number]  Geralynn Ochs,  LCSW

## 2018-04-18 NOTE — Progress Notes (Signed)
[facility] Progress Note  Subjective: not feeling well, nauseous, SOB. 2.1 L UOP yest after IV lasix.   Vitals:   04/18/18 0442 04/18/18 0449 04/18/18 1040 04/18/18 1148  BP: (!) 162/85   (!) 147/83  Pulse: 84   85  Resp: 14  17 16   Temp:    98.4 F (36.9 C)  TempSrc:    Oral  SpO2: 93%   99%  Weight:  62.3 kg    Height:        Inpatient medications: . amLODipine  10 mg Oral Daily  . aspirin EC  81 mg Oral Daily  . atorvastatin  80 mg Oral Daily  . B-complex with vitamin C  1 tablet Oral Daily  . buPROPion  100 mg Oral TID  . busPIRone  15 mg Oral BID  . carvedilol  6.25 mg Oral BID  . cholecalciferol  1,000 Units Oral Daily  . doxazosin  8 mg Oral Daily   And  . doxazosin  4 mg Oral QHS  . DULoxetine  30 mg Oral Daily  . hydrALAZINE  100 mg Oral Q8H  . isosorbide mononitrate  120 mg Oral Daily  . levothyroxine  112 mcg Oral QAC breakfast  . montelukast  10 mg Oral Daily  . pantoprazole  40 mg Oral BID AC  . traZODone  25 mg Oral QHS   . furosemide 120 mg (04/18/18 0444)  . heparin 1,000 Units/hr (04/18/18 0438)   acetaminophen, albuterol, ALPRAZolam, oxyCODONE  Iron/TIBC/Ferritin/ %Sat    Component Value Date/Time   IRON 18 (L) 04/18/2018 0229   TIBC 224 (L) 04/18/2018 0229   FERRITIN 98 04/17/2018 0942   IRONPCTSAT 8 (L) 04/18/2018 0229    Exam: Gen alert, thin, looks tired and uncomfortable No rash, cyanosis or gangrene Sclera anicteric, throat clear  +JVD to mid neck 45deg Chest bibasilar rales 1/4 up RRR 2/6 sem no RG Abd soft ntnd no mass or ascites +bs Ext 1-2+ R>L bilat LE pitting edema Neuro is alert, Ox 3 , nf, +myoclonic jerking of UE's, mild and intermittent    Home meds:  - amlodipine 10/ carvedilol 6.25 bid/ doxazosin 8 am+ 4 pm/ hydralazine 100 tid/ metolazone 2.5 biw/ torsemide 100 am+ 60 pm/ Kdur 20 qd  - alprazolam 0.25 hs prn/ bupropion 100 tid/ buspirone 15 bid/ duloxetine 30 qd/ oxy IR 5mg  qid prn/ trazodone 25  hs  - warfarin 2.5 qd  - pantoprazole 40/ montelukast 10 qd/ levothyroxine 112 ug  - isosorbide mononitrate 120 qd/ aspirin 81 qd/ atorvastatin 80 qd    Impression/ Plan: 1. CKD stage V - w/ uremia and vol overload, looks worse today, will plan HD today w/ temp cath.  HD again tomorrow.  Consult [facility] when more stable for perm access / TDC.   2. CAD hx CABG/ Maze/ PFO closure/ BIV pacemaker/ MV replacement - in Aug 2019 3. Vol overload - IV lasix 120 q 8 got 2L UOP yest , get vol down w HD also 4. HTN - BP's up, cont meds, diurese, HD 5. AFib - appreciate pharm assist, off coumadin and on IV hep now, INR down 6. Depression / anxiety - mult meds 7. Anemia ckd - Hb 8's, tsat 8%, will start IV Fe load and esa   Kelly Splinter MD [facility] pager [phone]   04/18/2018, 12:37 PM   Recent Labs  Lab 04/16/18 1805 04/17/18 0942 04/18/18 0229 04/18/18 0717  NA 136  --  136  --  K 4.7  --  3.9  --   CL 100  --  102  --   CO2 21*  --  24  --   GLUCOSE 229*  --  110*  --   BUN 59*  --  56*  --   CREATININE 6.03*  --  6.17*  --   CALCIUM 9.4  --  9.1  --   ALBUMIN 3.3*  --   --   --   INR  --  1.89  --  1.54   Recent Labs  Lab 04/16/18 1805  AST 16  ALT 15  ALKPHOS 76  BILITOT 0.5  PROT 6.3*   Recent Labs  Lab 04/16/18 1805 04/18/18 0229  WBC 6.9 7.5  HGB 8.7* 9.0*  HCT 26.9* 27.3*  MCV 76.2* 74.0*  PLT 281 292

## 2018-04-18 NOTE — Progress Notes (Signed)
ANTICOAGULATION CONSULT NOTE - Follow Up Consult  Pharmacy Consult for heparin Indication: atrial fibrillation  Labs: Recent Labs    04/16/18 1303 04/16/18 1805  04/17/18 0320 04/17/18 0942 04/17/18 1416 04/18/18 0229  HGB  --  8.7*  --   --   --   --  9.0*  HCT  --  26.9*  --   --   --   --  27.3*  PLT  --  281  --   --   --   --  292  LABPROT  --   --   --   --  21.5*  --   --   INR  --   --   --   --  1.89  --   --   HEPARINUNFRC  --   --   --   --   --   --  <0.10*  CREATININE 6.00* 6.03*  --   --   --   --  6.17*  TROPONINI  --  0.07*   < > 0.07* 0.07* 0.06*  --    < > = values in this interval not displayed.    Assessment: 67yo female subtherapeutic on heparin with initial dosing while Coumadin on hold.  Goal of Therapy:  Heparin level 0.3-0.7 units/ml   Plan:  Will increase heparin gtt by 1-2 units/kg/hr to 1000 units/hr as pt was recently therapeutic at this rate and check level in 8 hours.    Wynona Neat, PharmD, BCPS  04/18/2018,4:17 AM

## 2018-04-18 NOTE — Procedures (Signed)
Under sterile conditoins and using US guidance a 20 cm 3-lumen temporary HD Trialysis catheter was placed into the R femoral vein w/o complications. Ready to use. Indication: uremia, new ESRD.    Kelly Splinter MD [employer] pgr [phone]   04/18/2018, 4:29 PM

## 2018-04-18 NOTE — Procedures (Signed)
   I was present at this dialysis session, have reviewed the session itself and made  appropriate changes Kelly Splinter MD [place] pager [phone]   04/18/2018, 4:21 PM

## 2018-04-18 NOTE — Progress Notes (Signed)
  Echocardiogram 2D Echocardiogram has been performed.  Jannett Celestine 04/18/2018, 2:27 PM

## 2018-04-19 DIAGNOSIS — I5043 Acute on chronic combined systolic (congestive) and diastolic (congestive) heart failure: Secondary | ICD-10-CM

## 2018-04-19 DIAGNOSIS — R7889 Finding of other specified substances, not normally found in blood: Secondary | ICD-10-CM

## 2018-04-19 LAB — BASIC METABOLIC PANEL
Anion gap: 10 (ref 5–15)
Anion gap: 10 (ref 5–15)
BUN: 26 mg/dL — ABNORMAL HIGH (ref 8–23)
BUN: 28 mg/dL — ABNORMAL HIGH (ref 8–23)
CO2: 25 mmol/L (ref 22–32)
CO2: 25 mmol/L (ref 22–32)
Calcium: 9.3 mg/dL (ref 8.9–10.3)
Calcium: 9.4 mg/dL (ref 8.9–10.3)
Chloride: 104 mmol/L (ref 98–111)
Chloride: 103 mmol/L (ref 98–111)
Creatinine, Ser: 4.12 mg/dL — ABNORMAL HIGH (ref 0.44–1.00)
Creatinine, Ser: 4.29 mg/dL — ABNORMAL HIGH (ref 0.44–1.00)
GFR calc Af Amer: 12 mL/min — ABNORMAL LOW (ref >60)
GFR calc Af Amer: 11 mL/min — ABNORMAL LOW (ref >60)
GFR calc non Af Amer: 10 mL/min — ABNORMAL LOW (ref >60)
GFR calc non Af Amer: 10 mL/min — ABNORMAL LOW (ref >60)
Glucose, Bld: 104 mg/dL — ABNORMAL HIGH (ref 70–99)
Glucose, Bld: 89 mg/dL (ref 70–99)
Potassium: 3.7 mmol/L (ref 3.5–5.1)
Potassium: 3.8 mmol/L (ref 3.5–5.1)
Sodium: 139 mmol/L (ref 135–145)
Sodium: 138 mmol/L (ref 135–145)

## 2018-04-19 LAB — CBC
HCT: 31.1 % — ABNORMAL LOW (ref 36.0–46.0)
Hemoglobin: 9.7 g/dL — ABNORMAL LOW (ref 12.0–15.0)
MCH: 23.5 pg — ABNORMAL LOW (ref 26.0–34.0)
MCHC: 31.2 g/dL (ref 30.0–36.0)
MCV: 75.3 fL — ABNORMAL LOW (ref 80.0–100.0)
Platelets: 292 10*3/uL (ref 150–400)
RBC: 4.13 MIL/uL (ref 3.87–5.11)
RDW: 14.5 % (ref 11.5–15.5)
WBC: 7.1 10*3/uL (ref 4.0–10.5)
nRBC: 0 % (ref 0.0–0.2)

## 2018-04-19 LAB — PROTIME-INR
INR: 1.37
Prothrombin Time: 16.7 seconds — ABNORMAL HIGH (ref 11.4–15.2)

## 2018-04-19 LAB — GLUCOSE, CAPILLARY: Glucose-Capillary: 97 mg/dL (ref 70–99)

## 2018-04-19 LAB — HEPARIN LEVEL (UNFRACTIONATED): Heparin Unfractionated: 0.39 IU/mL (ref 0.30–0.70)

## 2018-04-19 MED ORDER — FUROSEMIDE 10 MG/ML IJ SOLN
160.0000 mg | Freq: Four times a day (QID) | INTRAVENOUS | Status: DC
  Administered 2018-04-19 – 2018-04-22 (×12): 160 mg via INTRAVENOUS
  Filled 2018-04-19 (×2): qty 16
  Filled 2018-04-19: qty 10
  Filled 2018-04-19 (×5): qty 16
  Filled 2018-04-19: qty 4
  Filled 2018-04-19 (×2): qty 10
  Filled 2018-04-19: qty 15
  Filled 2018-04-19: qty 10

## 2018-04-19 MED ORDER — ALUM & MAG HYDROXIDE-SIMETH 200-200-20 MG/5ML PO SUSP
15.0000 mL | Freq: Once | ORAL | Status: AC
  Administered 2018-04-19: 15 mL via ORAL
  Filled 2018-04-19: qty 30

## 2018-04-19 MED ORDER — CARVEDILOL 12.5 MG PO TABS
12.5000 mg | ORAL_TABLET | Freq: Two times a day (BID) | ORAL | Status: DC
  Administered 2018-04-20 (×2): 12.5 mg via ORAL
  Filled 2018-04-19 (×2): qty 1

## 2018-04-19 MED ORDER — ALUM & MAG HYDROXIDE-SIMETH 200-200-20 MG/5ML PO SUSP: 15.0000 mL | ORAL | Status: DC | PRN

## 2018-04-19 NOTE — Progress Notes (Signed)
[place] for heparin Indication: atrial fibrillation  Allergies  Allergen Reactions  . Ace Inhibitors Anaphylaxis and Swelling  . Motrin Ib [Ibuprofen] Anaphylaxis    Patient Measurements: Height: 5\' 9"  (175.3 cm) Weight: 129 lb 3.2 oz (58.6 kg) IBW/kg (Calculated) : 66.2 Heparin Dosing Weight: 65.2 kg   Vital Signs: Temp: 98.5 F (36.9 C) (10/21 0516) Temp Source: Oral (10/21 0516) BP: 167/89 (10/21 0826) Pulse Rate: 74 (10/21 0516)  Labs: Recent Labs    04/16/18 1805  04/17/18 0320 04/17/18 8366 04/17/18 1416  04/18/18 0229 04/18/18 0717 04/18/18 1136 04/18/18 2126 04/19/18 0651 04/19/18 1008  HGB 8.7*  --   --   --   --   --  9.0*  --   --   --  9.7*  --   HCT 26.9*  --   --   --   --   --  27.3*  --   --   --  31.1*  --   PLT 281  --   --   --   --   --  292  --   --   --  292  --   LABPROT  --   --   --  21.5*  --   --   --  18.3*  --   --  16.7*  --   INR  --   --   --  1.89  --   --   --  1.54  --   --  1.37  --   HEPARINUNFRC  --   --   --   --   --    < > <0.10*  --  0.21* 0.58  --  0.39  CREATININE 6.03*  --   --   --   --   --  6.17*  --   --   --  4.12*  --   TROPONINI 0.07*   < > 0.07* 0.07* 0.06*  --   --   --   --   --   --   --    < > = values in this interval not displayed.    Estimated Creatinine Clearance: 12.4 mL/min (A) (by C-G formula based on SCr of 4.12 mg/dL (H)).   Medical History: Past Medical History:  Diagnosis Date  . Anemia    low iron  . Anxiety   . Arthritis   . Bronchitis   . CHF (congestive heart failure) ([place])   . CKD (chronic kidney disease) stage 4, GFR 15-29 ml/min (HCC) 03/16/2017  . Coronary artery disease   . Depression   . Diabetes mellitus without complication ([place])   . Diet-controlled diabetes mellitus ([place])   . Ganglion cyst    left wrist  . GERD (gastroesophageal reflux disease)   . Headache    chronic headaches  . Heart failure ([place])   . Hyperlipidemia   .  Hypertension   . Hypothyroidism   . Mitral regurgitation   . Myocardial infarction ([place])    3x last one 2008  . PAF (paroxysmal atrial fibrillation) ([place])   . Pneumonia    x 2  . S/P Maze operation for atrial fibrillation 02/18/2018   Complete bilateral atrial lesion set using bipolar radiofrequency and cryothermy ablation with clipping of LA appendage  . S/P mitral valve replacement with bioprosthetic valve 02/18/2018   [facility] Mitral stented bovine pericardial tissue valve Model 7300 TFX Serial # [id number] Size 29  .  Stroke [facility])    no lasting residual - ? 2014  . Umbilical hernia     Medications:  Scheduled:  . amLODipine  10 mg Oral Daily  . aspirin EC  81 mg Oral Daily  . atorvastatin  80 mg Oral Daily  . B-complex with vitamin C  1 tablet Oral Daily  . buPROPion  100 mg Oral TID  . busPIRone  15 mg Oral BID  . carvedilol  6.25 mg Oral BID  . Chlorhexidine Gluconate Cloth  6 each Topical Q0600  . Chlorhexidine Gluconate Cloth  6 each Topical Q0600  . cholecalciferol  1,000 Units Oral Daily  . doxazosin  8 mg Oral Daily   And  . doxazosin  4 mg Oral QHS  . DULoxetine  30 mg Oral Daily  . hydrALAZINE  100 mg Oral Q8H  . isosorbide mononitrate  120 mg Oral Daily  . levothyroxine  112 mcg Oral QAC breakfast  . montelukast  10 mg Oral Daily  . pantoprazole  40 mg Oral BID AC  . traZODone  25 mg Oral QHS    Assessment: 75 yof on warfarin PTA for Afib s/p recent bioprosthetic MVR planning to undergo procedures. Pt reported home regimen as 2.5 mg daily (last dose 10/17 PM).   INR decreased from 1.89 to 1.37. Getting TDC today. Heparin level remains therapeutic   Goal of Therapy:  INR 2-3 Heparin level 0.3-0.7 units/ml Monitor platelets by anticoagulation protocol: Yes   Plan:  Continue heparin infusion at 1100 units/hr Monitor daily HL, CBC, and for s/sx of bleeding  Thanks for allowing pharmacy to be a part of this patient's care.  Marguerite Olea, [facility] Clinical Pharmacist Phone [phone]  04/19/2018 11:20 AM

## 2018-04-19 NOTE — Clinical Social Work Note (Signed)
Clinical Social Work Assessment  Patient Details  Name: Jocelyn Hill MRN: [id number] Date of Birth: Nov 12, 1950  Date of referral:  04/19/18               Reason for consult:  Facility Placement, Discharge Planning                Permission sought to share information with:  Facility Sport and exercise psychologist, Family Supports Permission granted to share information::  Yes, Verbal Permission Granted  Name::     Scientist, product/process development::  SNFs  Relationship::  daughter  Contact Information:  [phone]  Housing/Transportation Living arrangements for the past 2 months:  Apartment Source of Information:  Patient Patient Interpreter Needed:  None Criminal Activity/Legal Involvement Pertinent to Current Situation/Hospitalization:  No - Comment as needed Significant Relationships:  Adult Children Lives with:  Self Do you feel safe going back to the place where you live?  Yes Need for family participation in patient care:  Yes (Comment)  Care giving concerns: Patient from home. PT recommending SNF. Patient recently at [facility] May be new HD.   Social Worker assessment / plan: CSW met with patient at bedside. Patient alert and oriented. CSW introduced self and role and discussed disposition planning. Patient would like to return to [facility] in [place] for rehab before returning home. Upon chart review, noted that family has been considering possible long term care placement for patient.  CSW spoke to patient's daughter, [name], on the phone to discuss disposition planning. [name] also agreeable to SNF and indicated she has tried to help patient with Medicaid, but patient is not eligible due to owning land in [place]. Patient recently at [facility] for rehab and may be in co-pay days with her insurance. CSW to confirm with the facility.  CSW spoke to [employer] at [facility]. [facility] will offer a bed for patient and can transport patient to dialysis only if scheduled on  MWF. Facility will also need to obtain [facility] authorization for patient. Will start auth as patient nears discharge and will need PT notes from within last 24 hours in order to obtain the [place].   CSW to follow for medical readiness and support with disposition planning.  Employment status:  Retired Research officer, political party) PT Recommendations:  [place] / Referral to community resources:  [place]  Patient/Family's Response to care: Patient and daughter appreciative of care.  Patient/Family's Understanding of and Emotional Response to Diagnosis, Current Treatment, and Prognosis: Patient and daughter with understanding of patient's condition and agreeable to SNF.  Emotional Assessment Appearance:  Appears stated age Attitude/Demeanor/Rapport:  Engaged Affect (typically observed):  Accepting, Calm, Appropriate Orientation:  Oriented to Self, Oriented to Place, Oriented to  Time, Oriented to Situation Alcohol / Substance use:  Not Applicable Psych involvement (Current and /or in the community):  No (Comment)  Discharge Needs  Concerns to be addressed:  Discharge Planning Concerns, Care Coordination Readmission within the last 30 days:  No Current discharge risk:  Physical Impairment Barriers to Discharge:  Continued Medical Work up, [place], LCSW 04/19/2018, 3:03 PM

## 2018-04-19 NOTE — Consult Note (Addendum)
Advanced Heart Failure Team Consult Note   Primary Physician: [name], MD PCP-Cardiologist:  [name], MD  Reason for Consultation: A/C combined HF  HPI:    Jocelyn Hill is seen today for evaluation of A/C combined HF at the request of Dr Nori Riis.   Jocelyn Hill. Hill a 67 y.o.femalewith h/o mitral regurgitation, chronic diastolic CHF, CAD s/p MI, long standing HTN, DM2, CKD IV-V, h/o Stroke. Anxiety, depression and GERD. Patient reported that she has had 2 MIs treated at [facility] in [place] several years ago.   She was admitted in 01/2018 for Bioprosthetic MVR, Maze, LA appendage clip, PFO closure, and CABG with LIMA-LAD and SVG-LCx.  This was complicated by AKI and complete heart block.  St Jude CRT-P device placed.  Echo in 8/19 post-op showed EF 45-50%, stable bioprosthetic mitral valve.    She saw Dr Aundra Dubin in [place] [facility] 04/02/18. She was volume overloaded and torsemide was increased. BMET checked and showed creatinine of 4.3, which is where she has been since surgery. She was instructed to follow up with nephrology. Weight 133 lbs.   I saw her in clinic 04/16/18 and weight was up another 10 lbs to 143 lbs. She was given 80 mg IV lasix in clinic with little UOP. She insisted on leaving before labs came back. Creatinine came back at 6.0 and she was called and advised to go to ED for further evaluation.   She presented to [facility] on 03/47/42 with A/C diastolic HF in setting of acute on CKD with creatinine 6.0. K 4.7, BNP 2442, troponin trend flat. CXR: Stable small bilateral pleural effusions, pulmonary vascular congestion, and cardiomegaly.   Cardiology was consulted. She had some UOP with high dose IV lasix. She was complaining of CP, but EKG unchanged and troponin trend flat. Repeat echo showed lower EF, now 30-35%.  Nephrology was consulted and she had tunneled HD cath placed. She had HD 10/20 with 2 L off. Weight is down to 129 lbs. She is NPO for AVF  placement today.   She remains on IV lasix 120 mg q 8 hours with modest UOP. Weight down 14 lbs from admit weight. She got HD yesterday with 2 L off. Creatinine 4.12 this morning.   SBP 160s.  Feels fatigued. SOB is improved. CP on and off, about the same. No exertional CP. Not moving much. NPO for AVF today.   Cardiac studies:  LHC/RHC in 1/19 showed 90% ostial LCx, 60% mid LAD.   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   Echo 04/18/2018: EF 30-35%, severe concentric hypertrophy, grade 2 DD, s/p MVR with mean gradient 5 mm Hg, RV normal, mild PI, trivial pericardial effusion  Review of Systems: [y] = yes, [ ]  = no   General: Weight gain [ y]; Weight loss [ ] ; Anorexia Blue.Reese ]; Fatigue [ ] ; Fever [ ] ; Chills [ ] ; Weakness Blue.Reese ]  Cardiac: Chest pain/pressure Blue.Reese ]; Resting SOB Blue.Reese ]; Exertional SOB Blue.Reese ]; Orthopnea [ y]; Pedal Edema Blue.Reese ]; Palpitations [ ] ; Syncope [ ] ; Presyncope [ ] ; Paroxysmal nocturnal dyspnea[y ]  Pulmonary: Cough [ y]; Wheezing[ ] ; Hemoptysis[ ] ; Sputum [ ] ; Snoring [ ]   GI: Vomiting[y ]; Dysphagia[ ] ; Melena[ ] ; Hematochezia [ ] ; Heartburn[ ] ; Abdominal pain [ ] ; Constipation [ ] ; Diarrhea [ ] ; BRBPR [ ]   GU: Hematuria[ ] ; Dysuria [ ] ; Nocturia[ ]   Vascular: Pain in legs with walking [ ] ; Pain in feet with lying flat [ ] ; Non-healing sores [ ] ; Stroke [ ] ; TIA [ ] ; Slurred speech [ ] ;  Neuro: Headaches[ ] ; Vertigo[ ] ; Seizures[ ] ; Paresthesias[ ] ;Blurred vision [ ] ; Diplopia [ ] ; Vision changes [ ]   Ortho/Skin: Arthritis [ ] ; Joint pain [ ] ; Muscle pain [ ] ; Joint swelling [ ] ; Back Pain [ ] ; Rash [ ]   Psych: Depression[ ] ; Anxiety[ ]   Heme: Bleeding problems [ ] ; Clotting disorders [ ] ; Anemia [ ]   Endocrine: Diabetes [ ] ; Thyroid dysfunction[ ]   Home Medications Prior to Admission medications   Medication Sig Start Date End Date Taking? Authorizing Provider  acetaminophen  (TYLENOL) 325 MG tablet Take 2 tablets (650 mg total) by mouth every 6 (six) hours as needed for mild pain, fever or headache. 04/23/17  Yes Allie Bossier, MD  albuterol (PROVENTIL HFA;VENTOLIN HFA) 108 (90 Base) MCG/ACT inhaler Inhale 2 puffs into the lungs every 6 (six) hours as needed for wheezing or shortness of breath.   Yes [provider]  ALPRAZolam (XANAX) 0.25 MG tablet Take 1 tablet (0.25 mg total) by mouth at bedtime as needed for anxiety. 04/16/18  Yes Georgiana Shore, NP  amLODipine (NORVASC) 10 MG tablet Take 1 tablet (10 mg total) by mouth daily. 04/24/17  Yes Allie Bossier, MD  aspirin EC 81 MG EC tablet Take 1 tablet (81 mg total) by mouth daily. 04/24/17  Yes Allie Bossier, MD  atorvastatin (LIPITOR) 80 MG tablet Take 1 tablet (80 mg total) by mouth daily. 03/06/18  Yes Barrett, Erin R, PA-C  b complex vitamins tablet Take 1 tablet by mouth daily.   Yes [provider]  buPROPion (WELLBUTRIN) 100 MG tablet Take 100 mg by mouth 3 (three) times daily.   Yes [provider]  busPIRone (BUSPAR) 15 MG tablet Take 1 tablet (15 mg total) by mouth 2 (two) times daily. 04/23/17  Yes Allie Bossier, MD  carvedilol (COREG) 6.25 MG tablet Take 6.25 mg by mouth 2 (two) times daily.   Yes [provider]  cholecalciferol (VITAMIN D) 1000 units tablet Take 1,000 Units by mouth daily.   Yes [provider]  doxazosin (CARDURA) 8 MG tablet Take 8 mg (1 tab) in am and 4 mg (1/2 tab) in pm Patient taking differently: Take 4-8 mg by mouth See admin instructions. Take 1 tablet every morning and take 1/2 tablet every evening 03/11/18  Yes Clegg, Amy D, NP  DULoxetine (CYMBALTA) 30 MG capsule Take 1 capsule (30 mg total) by mouth daily. 04/24/17  Yes Allie Bossier, MD  gabapentin (NEURONTIN) 300 MG capsule Take 300 mg by mouth at bedtime. 10/02/17  Yes [provider]  hydrALAZINE (APRESOLINE) 100 MG tablet Take 1 tablet (100 mg total) by mouth  every 8 (eight) hours. 03/05/18  Yes Barrett, Erin R, PA-C  isosorbide mononitrate (IMDUR) 120 MG 24 hr tablet Take 1 tablet (120 mg total) by mouth daily. 04/24/17  Yes Allie Bossier, MD  levothyroxine (SYNTHROID, LEVOTHROID) 112 MCG tablet Take 112 mcg by mouth daily before breakfast.   Yes [provider]  montelukast (SINGULAIR) 10 MG tablet Take 1 tablet (10 mg total) by mouth daily. 04/24/17  Yes Allie Bossier, MD  Nutritional Supplements (CARNATION BREAKFAST ESSENTIALS PO) Take 237 mLs by mouth 2 (two) times daily.   Yes [provider]  oxyCODONE (OXY IR/ROXICODONE) 5 MG immediate release tablet Take  1 tablet (5 mg total) by mouth every 4 (four) hours as needed for severe pain. 03/05/18  Yes Barrett, Erin R, PA-C  pantoprazole (PROTONIX) 40 MG tablet Take 1 tablet (40 mg total) by mouth 2 (two) times daily before a meal. 04/23/17  Yes Allie Bossier, MD  potassium chloride SA (K-DUR,KLOR-CON) 20 MEQ tablet Take 20 mEq by mouth daily.   Yes [provider]  torsemide (DEMADEX) 20 MG tablet Take 100 mg in am and 60 mg in pm Patient taking differently: Take 60-100 mg by mouth See admin instructions. Take 5 tablets every morning then take 3 tablets every evening 04/02/18  Yes Larey Dresser, MD  traZODone (DESYREL) 25 mg TABS tablet Take 25 mg by mouth at bedtime.   Yes [provider]  warfarin (COUMADIN) 2.5 MG tablet Take 1 tablet (2.5 mg total) by mouth daily at 6 PM. 03/05/18  Yes Barrett, Erin R, PA-C  metolazone (ZAROXOLYN) 2.5 MG tablet Take 1 tablet (2.5 mg total) by mouth 2 (two) times a week. 04/19/18 07/18/18  Georgiana Shore, NP  ondansetron (ZOFRAN) 4 MG tablet Take 1 tablet (4 mg total) by mouth every 8 (eight) hours as needed for nausea or vomiting. Patient not taking: Reported on 04/17/2018 04/23/17 04/23/18  Allie Bossier, MD    Past Medical History: Past Medical History:  Diagnosis Date  . Anemia    low iron  . Anxiety   . Arthritis     . Bronchitis   . CHF (congestive heart failure) ([place])   . CKD (chronic kidney disease) stage 4, GFR 15-29 ml/min (HCC) 03/16/2017  . Coronary artery disease   . Depression   . Diabetes mellitus without complication ([place])   . Diet-controlled diabetes mellitus ([place])   . Ganglion cyst    left wrist  . GERD (gastroesophageal reflux disease)   . Headache    chronic headaches  . Heart failure ([place])   . Hyperlipidemia   . Hypertension   . Hypothyroidism   . Mitral regurgitation   . Myocardial infarction ([place])    3x last one 2008  . PAF (paroxysmal atrial fibrillation) ([place])   . Pneumonia    x 2  . S/P Maze operation for atrial fibrillation 02/18/2018   Complete bilateral atrial lesion set using bipolar radiofrequency and cryothermy ablation with clipping of LA appendage  . S/P mitral valve replacement with bioprosthetic valve 02/18/2018   [facility] Mitral stented bovine pericardial tissue valve Model 7300 TFX Serial # [id number] Size 29  . Stroke [facility])    no lasting residual - ? 2014  . Umbilical hernia     Past Surgical History: Past Surgical History:  Procedure Laterality Date  . ABDOMINAL HYSTERECTOMY     PARTIALS  . BIV PACEMAKER INSERTION CRT-P N/A 02/26/2018   Procedure: BIV PACEMAKER INSERTION CRT-P;  Surgeon: Constance Haw, MD;  Location: [place] CV LAB;  Service: Cardiovascular;  Laterality: N/A;  . CARDIAC CATHETERIZATION    . CESAREAN SECTION    . CORONARY ARTERY BYPASS GRAFT N/A 02/18/2018   Procedure: CORONARY ARTERY BYPASS GRAFTING (CABG) WITH IMA. ENDOSCOPIC VEIN HARVEST. IMA TO LAD, SVG TO CIRC.;  Surgeon: Rexene Alberts, MD;  Location: [place];  Service: Open Heart Surgery;  Laterality: N/A;  . INSERTION OF DIALYSIS CATHETER N/A 02/18/2018   Procedure: PLACEMENT OF TEMPORARY HD CATHETER, POWER TRIALYSIS 13FR 20CM;  Surgeon: Rexene Alberts, MD;  Location: [place];  Service: Vascular;  Laterality: N/A;  .  IR NEPHRO TUBE REMOV/FL  04/02/2017  . IR  NEPHROSTOGRAM RIGHT THRU EXISTING ACCESS  04/02/2017  . IR NEPHROSTOMY PLACEMENT RIGHT  03/27/2017  . IR THORACENTESIS ASP PLEURAL SPACE W/IMG GUIDE  03/24/2017  . MAZE N/A 02/18/2018   Procedure: MAZE;  Surgeon: Rexene Alberts, MD;  Location: [place];  Service: Open Heart Surgery;  Laterality: N/A;  . MITRAL VALVE REPLACEMENT N/A 02/18/2018   Procedure: MITRAL VALVE (MV) REPLACEMENT;  Surgeon: Rexene Alberts, MD;  Location: [place];  Service: Open Heart Surgery;  Laterality: N/A;  . MULTIPLE EXTRACTIONS WITH ALVEOLOPLASTY N/A 11/05/2017   Procedure: Extraction of tooth #'s 4,5,7,8,9,10,12,14 and 29 with alveoloplasty and gross debridement of remaining teeth;  Surgeon: [name], DDS;  Location: [place];  Service: Oral Surgery;  Laterality: N/A;  . PATENT FORAMEN OVALE(PFO) CLOSURE N/A 02/18/2018   Procedure: PATENT FORAMEN OVALE (PFO) CLOSURE;  Surgeon: Rexene Alberts, MD;  Location: [place];  Service: Open Heart Surgery;  Laterality: N/A;  . RIGHT/LEFT HEART CATH AND CORONARY ANGIOGRAPHY N/A 07/03/2017   Procedure: RIGHT/LEFT HEART CATH AND CORONARY ANGIOGRAPHY;  Surgeon: Larey Dresser, MD;  Location: [place] [facility];  Service: Cardiovascular;  Laterality: N/A;  . TEE WITHOUT CARDIOVERSION N/A 10/08/2017   Procedure: TRANSESOPHAGEAL ECHOCARDIOGRAM (TEE);  Surgeon: Larey Dresser, MD;  Location: [facility] ENDOSCOPY;  Service: Cardiovascular;  Laterality: N/A;  . TEE WITHOUT CARDIOVERSION N/A 02/18/2018   Procedure: TRANSESOPHAGEAL ECHOCARDIOGRAM (TEE);  Surgeon: Rexene Alberts, MD;  Location: [place];  Service: Open Heart Surgery;  Laterality: N/A;  . TONSILLECTOMY    . TUBAL LIGATION      Family History: Family History  Problem Relation Age of Onset  . Hypertension Mother     Social History: Social History   Socioeconomic History  . Marital status: Divorced    Spouse name: Not on file  . Number of children: Not on file  . Years of education: Not on file  . Highest education level:  Not on file  Occupational History  . Not on file  Social Needs  . Financial resource strain: Not on file  . Food insecurity:    Worry: Not on file    Inability: Not on file  . Transportation needs:    Medical: Not on file    Non-medical: Not on file  Tobacco Use  . Smoking status: Former Smoker    Types: Cigarettes  . Smokeless tobacco: Never Used  Substance and Sexual Activity  . Alcohol use: No  . Drug use: No    Comment: marijuana in the past  . Sexual activity: Not Currently  Lifestyle  . Physical activity:    Days per week: Not on file    Minutes per session: Not on file  . Stress: Not on file  Relationships  . Social connections:    Talks on phone: Not on file    Gets together: Not on file    Attends religious service: Not on file    Active member of club or organization: Not on file    Attends meetings of clubs or organizations: Not on file    Relationship status: Not on file  Other Topics Concern  . Not on file  Social History Narrative  . Not on file    Allergies:  Allergies  Allergen Reactions  . Ace Inhibitors Anaphylaxis and Swelling  . Motrin Ib [Ibuprofen] Anaphylaxis    Objective:    Vital Signs:   Temp:  [97.8 F (  36.6 C)-99.7 F (37.6 C)] 98.5 F (36.9 C) (10/21 0516) Pulse Rate:  [74-90] 74 (10/21 0516) Resp:  [14-21] 16 (10/21 0516) BP: (141-171)/(76-93) 167/89 (10/21 0826) SpO2:  [94 %-100 %] 94 % (10/21 0516) Weight:  [58.6 kg] 58.6 kg (10/21 0516) Last BM Date: 04/17/18  Weight change: Filed Weights   04/17/18 2145 04/18/18 0449 04/19/18 0516  Weight: 65.3 kg 62.3 kg 58.6 kg    Intake/Output:   Intake/Output Summary (Last 24 hours) at 04/19/2018 0939 Last data filed at 04/19/2018 0529 Gross per 24 hour  Intake 240 ml  Output 3500 ml  Net -3260 ml      Physical Exam    General:  Appears fatigued. No resp difficulty. Thin. HEENT: normal Neck: supple. JVP to jaw. Carotids 2+ bilat; no bruits. No lymphadenopathy or  thyromegaly appreciated. Cor: PMI nondisplaced. Regular rate & rhythm. No rubs, gallops or murmurs. Tunneled HD cath in right groin. Lungs: crackles in bases and in LUL Abdomen: soft, nontender, nondistended. No hepatosplenomegaly. No bruits or masses. Good bowel sounds. Extremities: no cyanosis, clubbing, rash, RLE 1+ edema to knees. LLE no edema. Neuro: alert & orientedx3, cranial nerves grossly intact. moves all 4 extremities w/o difficulty. Affect pleasant   Telemetry   BiV paced 80s with NSR underlying. Personally reviewed.   EKG    NSR 79 bpm with LBBB. Personally reviewed.   Labs   Basic Metabolic Panel: Recent Labs  Lab 04/16/18 1303 04/16/18 1805 04/18/18 0229 04/19/18 0651  NA 136 136 136 139  K 4.8 4.7 3.9 3.7  CL 104 100 102 104  CO2 24 21* 24 25  GLUCOSE 92 229* 110* 104*  BUN 60* 59* 56* 26*  CREATININE 6.00* 6.03* 6.17* 4.12*  CALCIUM 9.3 9.4 9.1 9.3    Liver Function Tests: Recent Labs  Lab 04/16/18 1805  AST 16  ALT 15  ALKPHOS 76  BILITOT 0.5  PROT 6.3*  ALBUMIN 3.3*   No results for input(s): LIPASE, AMYLASE in the last 168 hours. No results for input(s): AMMONIA in the last 168 hours.  CBC: Recent Labs  Lab 04/16/18 1805 04/18/18 0229 04/19/18 0651  WBC 6.9 7.5 7.1  HGB 8.7* 9.0* 9.7*  HCT 26.9* 27.3* 31.1*  MCV 76.2* 74.0* 75.3*  PLT 281 292 292    Cardiac Enzymes: Recent Labs  Lab 04/16/18 1805 04/16/18 2335 04/17/18 0320 04/17/18 0942 04/17/18 1416  TROPONINI 0.07* 0.07* 0.07* 0.07* 0.06*    BNP: BNP (last 3 results) Recent Labs    02/17/18 0353 03/11/18 1458 04/16/18 1805  BNP 675.7* 1,452.6* 2,442.0*    ProBNP (last 3 results) No results for input(s): PROBNP in the last 8760 hours.   CBG: No results for input(s): GLUCAP in the last 168 hours.  Coagulation Studies: Recent Labs    04/17/18 0942 04/18/18 0717 04/19/18 0651  LABPROT 21.5* 18.3* 16.7*  INR 1.89 1.54 1.37     Imaging    No  results found.   Medications:     Current Medications: . amLODipine  10 mg Oral Daily  . aspirin EC  81 mg Oral Daily  . atorvastatin  80 mg Oral Daily  . B-complex with vitamin C  1 tablet Oral Daily  . buPROPion  100 mg Oral TID  . busPIRone  15 mg Oral BID  . carvedilol  6.25 mg Oral BID  . Chlorhexidine Gluconate Cloth  6 each Topical Q0600  . Chlorhexidine Gluconate Cloth  6 each Topical Q0600  .  cholecalciferol  1,000 Units Oral Daily  . doxazosin  8 mg Oral Daily   And  . doxazosin  4 mg Oral QHS  . DULoxetine  30 mg Oral Daily  . hydrALAZINE  100 mg Oral Q8H  . isosorbide mononitrate  120 mg Oral Daily  . levothyroxine  112 mcg Oral QAC breakfast  . montelukast  10 mg Oral Daily  . pantoprazole  40 mg Oral BID AC  . traZODone  25 mg Oral QHS     Infusions: . ferric gluconate (FERRLECIT/NULECIT) IV    . furosemide 120 mg (04/19/18 0536)  . heparin 1,100 Units/hr (04/18/18 2129)       Patient Profile   Jocelyn Hill a 67 y.o.femalewith h/o mitral regurgitation s/p bioprosthetic MVR 08/5463, chronic diastolic CHF, CAD s/p CABG with LIMA-LAD and SVG-LCx 01/2018, long standing HTN, DM2, CKD IV-V, h/o Stroke, anxiety, depression, GERD. Admitted in 8/19 for Bioprosthetic MVR, Maze, LA appendage clip, PFO closure, and CABG with LIMA-LAD and SVG-LCx.  This was complicated by AKI and complete heart block.  St Jude CRT-P device placed.  Echo in 8/19 post-op showed EF 45-50%, stable bioprosthetic mitral valve.    She was sent to ED 04/16/18 with A/C HF and creatinine 6.0.   Assessment/Plan    1. Acute on chronic combined CHF: Echo (8/19, post-op) with EF 45-50%.  Has St Jude CRT-P device due to post-op complete heart block.  - Echo this admission with lower EF 30-35%. - Volume status remains elevated.  - She remains on 120 mg IV lasix q8 hours. Will discuss with Dr Aundra Dubin. Can probably DC since she seems to be HD dependent. Creatinine 4.18 (after HD) - Increase  coreg to 12.5 mg BID - Continue hydralazine 100 mg q8 and imdur 120 mg daily  - No spiro with CKD IV - No ACE with hx of angioedema/CKD IV 2. CKD Stage IV: Had R hydronephrosis in setting of hydronephrosis and required percutaneous drain in 9/18 (removed). Renal US 04/14/17 showed no further evidence of hydronephrosis. - Creatinine peaked at 6.17. - Had HD with 2 L off 10/20. Creatinine 4.12 today. Coumadin on hold and on heparin for AVF today. 3. KCL:EXNTZGY elevated with SBP 160s. - Continue hydralazine 100 mg tid.   - Continue amlodipine 10 mg daily.  - Increase coreg to 12.5 mg BID.  - Continue doxazosin 8 qam/4 qpm.   4.  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  Had bioprosthetic MVR in 8/19.  Post-op echo in 8/19 showed stable bioprosthetic mitral valve. Echo this admission showed mean gradient 5 mm Hg. 5. Atrial fibrillation: Paroxysmal.  Now s/p Maze in 8/19. Regular on exam today.  - Coumadin held. She is on heparin drip now for ?AVF placement.  6. CAD:  LHC in 1/19 showed severe ostial LCx disease and moderate LAD disease. She had LIMA-LAD and SVG-OM in 8/19.  - She has chronic atypical CP. Continues to have CP on and off, lasts about 30 minutes. Unrelated to activity. Troponin trend flat. EKG unchanged.  - Continue ASA 81 and atorvastatin 80 mg daily. Good lipids 03/2018 - Continue Imdur 120 mg daily. Increase Coreg 12.5 mg BID 7. Disposition - PT recommending HH PT.   Medication concerns reviewed with patient and pharmacy team. Barriers identified: none  Addendum: Discussed with Dr Aundra Dubin. Will increase coreg to help with BP control.   Length of Stay: [place], NP  04/19/2018, 9:39 AM  Advanced Heart Failure Team Pager  [phone] (M-F; 7a - 4p)  Please contact [facility] for night-coverage after hours (4p -7a ) and weekends on [email or web address]  Patient seen with NP, agree with the above note.    Patient presented with worsening renal function and volume  overload.  She had HD yesterday, weight down considerably.  Trying her on high dose Lasix today per renal, she still has reasonable UOP.  She feels "tired."  No further chest pain.   On exam, JVP 10 cm.  Regular S1S2, 1/6 SEM RUSB.  No edema.   I suspect she will end up needing HD for volume management.  Had HD yesterday, getting high dose Lasix IV today (per renal).   Echo this admission with EF lower at 30-35%.   - Avoiding ACEI/ARB/ARNI until she is clearly committed to HD.  - Can increase Coreg to 12.5 mg bid today with elevated BP.  - Continue hydralazine/Imdur.   Bioprosthetic MV stable on echo this admission.   Suspect her chest pain is noncardiac.  Atypical, had CABG recently.  TnI mildly elevated with no trend in setting of AKI.  Probably demand ischemia with volume overload.   [name] 04/19/2018 3:08 PM

## 2018-04-19 NOTE — Progress Notes (Signed)
Subjective:  S/p temp HD cath placed in femoral vein followed by first HD yest- tolerated well - had 2100 of UOP.  She is c/o pain where the temp HD cath is and of chest pain but no nausea and overall feels better than when she came into hospital   Objective Vital signs in last 24 hours: Vitals:   04/18/18 1906 04/18/18 2024 04/19/18 0516 04/19/18 0826  BP: (!) 147/92 (!) 145/76 (!) 168/85 (!) 167/89  Pulse: 88 90 74   Resp: 14 18 16    Temp: 98.4 F (36.9 C) 99.7 F (37.6 C) 98.5 F (36.9 C)   TempSrc: Oral Oral Oral   SpO2: 100% 97% 94%   Weight:   58.6 kg   Height:       Weight change: -6.713 kg  Intake/Output Summary (Last 24 hours) at 04/19/2018 1047 Last data filed at 04/19/2018 0900 Gross per 24 hour  Intake 0 ml  Output 3500 ml  Net -3500 ml    Assessment/ Plan: Pt is a 67 y.o. yo female with advanced CKD who was admitted on 04/16/2018 with what appears to be uremic symptoms with BUN in the high 50's  Assessment/Plan: 1. Renal- felt to be uremic but BUN only 50's- s/p temp cath and first HD on 10/20- was planning for another treatment today - making urine and BUN only 26- I think I want to hold HD today and see what she does.  Is OK to proceed with [facility] however as I suspect she will need again   2. Anemia- hgb trending up without transfusion- 9.7 today - no action  3. Secondary hyperparathyroidism- has been on nutritional vit D only - will check PTH and phos 4. HTN/volume- BP high- as OP on amlodipine, coreg, cardura, hydralazine and high dose diuretics - this has been continued here - I will inc diuretic   Louis Meckel    Labs: Basic Metabolic Panel: Recent Labs  Lab 04/16/18 1805 04/18/18 0229 04/19/18 0651  NA 136 136 139  K 4.7 3.9 3.7  CL 100 102 104  CO2 21* 24 25  GLUCOSE 229* 110* 104*  BUN 59* 56* 26*  CREATININE 6.03* 6.17* 4.12*  CALCIUM 9.4 9.1 9.3   Liver Function Tests: Recent Labs  Lab 04/16/18 1805  AST 16  ALT 15  ALKPHOS  76  BILITOT 0.5  PROT 6.3*  ALBUMIN 3.3*   No results for input(s): LIPASE, AMYLASE in the last 168 hours. No results for input(s): AMMONIA in the last 168 hours. CBC: Recent Labs  Lab 04/16/18 1805 04/18/18 0229 04/19/18 0651  WBC 6.9 7.5 7.1  HGB 8.7* 9.0* 9.7*  HCT 26.9* 27.3* 31.1*  MCV 76.2* 74.0* 75.3*  PLT 281 292 292   Cardiac Enzymes: Recent Labs  Lab 04/16/18 1805 04/16/18 2335 04/17/18 0320 04/17/18 0942 04/17/18 1416  TROPONINI 0.07* 0.07* 0.07* 0.07* 0.06*   CBG: No results for input(s): GLUCAP in the last 168 hours.  Iron Studies:  Recent Labs    04/17/18 0942 04/18/18 0229  IRON  --  18*  TIBC  --  224*  FERRITIN 98  --    Studies/Results: No results found. Medications: Infusions: . ferric gluconate (FERRLECIT/NULECIT) IV    . furosemide 120 mg (04/19/18 0536)  . heparin 1,100 Units/hr (04/18/18 2129)    Scheduled Medications: . amLODipine  10 mg Oral Daily  . aspirin EC  81 mg Oral Daily  . atorvastatin  80 mg Oral Daily  .  B-complex with vitamin C  1 tablet Oral Daily  . buPROPion  100 mg Oral TID  . busPIRone  15 mg Oral BID  . carvedilol  6.25 mg Oral BID  . Chlorhexidine Gluconate Cloth  6 each Topical Q0600  . Chlorhexidine Gluconate Cloth  6 each Topical Q0600  . cholecalciferol  1,000 Units Oral Daily  . doxazosin  8 mg Oral Daily   And  . doxazosin  4 mg Oral QHS  . DULoxetine  30 mg Oral Daily  . hydrALAZINE  100 mg Oral Q8H  . isosorbide mononitrate  120 mg Oral Daily  . levothyroxine  112 mcg Oral QAC breakfast  . montelukast  10 mg Oral Daily  . pantoprazole  40 mg Oral BID AC  . traZODone  25 mg Oral QHS    have reviewed scheduled and prn medications.  Physical Exam: General: flat, c/o leg pain- difficult to read Heart: RRR Lungs: mostly clear- dec BS at extreme bases Abdomen: distended but better Extremities: minimal periph edema Dialysis Access: right femoral HD cath in place minimal bruising      04/19/2018,10:47 AM  LOS: 2 days

## 2018-04-19 NOTE — Progress Notes (Signed)
Family Medicine Teaching Service Daily Progress Note Intern Pager: [phone]  Patient name: Jocelyn Hill Medical record number: [id number] Date of birth: [birth year] Age: 67 y.o. Gender: female  Primary Care Provider: [name], MD Consultants: Cardiology, Nephrology  Code Status: Full   Pt Overview and Major Events to Date:  Admitted to [facility] 04/17/2018  Assessment and Plan: ARYIANNA Hill a 67 y.o.femalepresenting from cardiology officewith elevated creatinine and chest pain. PMH is significant forCHF, CKD IV, HTN,CAD s/p MI and CABG (01/2018),hypothyroidism, paroxysmalA-fibs/p MAZE (01/2018),mitral regurgitation s/p mitral valve replacement (01/2018), microcytic anemia, anxiety/depression.  HFrEF exacerbation  ARF in setting of CKD IV: Acute on chronic. Echo yesterday significant for EF 30-35% with diffuse hypokinesis and interventricular dyssyncrhony which is a decline from Echo in 01/2018 showing EF 45-50%. EDW ~125-130. Current weight 129.2lb, down from 144lb on 10/19. Net negative output since admission -3.74L. Creatinine improving 6.03>6.17>4.12 (BL 4). Cardiology consulted with recommendations of continuation of coreg, hydralazine, and imdur. Treated with IV lasix with placement of temporary cath yesterday with one session of HD. Patient tolerated this well with 2.1L removed. Overnight patient remained hemodynamically stable and afebrile. Per nephrology, since patient has had good urine output and BUN down, will hold off HD today and increase lasix dose. Will monitor UOP overnight. IR consulted by nephrology for [facility] with plans for future perm cath. PT/OT consulted and recommend SNF. Some pain at temporary cath site, managed with Oxy 5mg  IR - Cardiology consulted, appreciate recommendations  - Nephrology consulted, appreciate recommendations  - IR consulted, appreciate recommendations - Increase IV lasix from 120 to 160mg  q8h - Daily weights - Strict I&O's - continue  fluid restriction  - continue coreg, hydralazine, and imdur per cardiology recommendations  - continue to hold home Torsemide and metolazone   - Avoid nephrotoxic agents - PT/OT consulted - recommend SNF  - pt requesting [name] - CSW aware - Continue Oxy 5mg  IR q4 hours PRN pain - Tylenol PRN pain - AM BMP, PTH, Renal function panel  Atypical ChestPain: Chronic. S/p CABG with LIMA-LAD and SVG-LCx on 01/2018. Atypical in that it does not radiate, not associated with exertion, and is intermittent noted to occur along incision line and at pacemaker site. Patient has history of CAD s/p MI in August 2019 with CABG and biventricular pacemaker in place 2/2 to complete heart block. Troponins with low flat trend, per cardiology not c/w ACS.Likely demand ischemia in setting of acute CHF exacerbation and anemia and worsening renal function. Echo showed decline in heart function with EF 30-35% (down from 40-45% in August 2019).  - Cardiology consulted, appreciate recommendations  - Continuous cardiac monitor  - Contiuous pulse ox with O2 PRN - Continue Aspirin 81mg  - Continue home Atorvastatin, Coreg, doxazosin, hydralazine, Imdur - Hold home warfarin, on IV Heparin for cath placement - pharmacy managing   History of Mitral regurgitation s/p mitral valve replacement Chronic, stable. 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 Biprosthetic MV replacement in August 2019. Post-op echo was stable. Echo yesterday unchanged in regards to MV.  Atrial Fibrillation s/p MAZE Chronic, stable. MAZE procedure in August 2019. No recurrence since.  - Hold warfarin, on IV heparin for HD cath placement  HTN: Remains hypertensive. AM BP of 167/89. On home hydralazine, amlodipine, Coreg, doxazosin. Amlodipine 10mg  restarted. Per heart failure, will increase Coreg to 12.5mg  BID.  - Increase Coreg to 12.5mg  BID - Continue hydralazine, doxazosin, and Amlodipine - Monitor BP's  Microcytic Anemia: chroic,  stable Currently at baseline. Hgb  trend during admission 8.7>9.0>9.7 (BL 8 since August 2019). Anemia panel showing low iron of 18, TIBC low at 224, and low sat ratio of 8. Ferritin on 10/19 of 98. No signs of bleed. Iron infusion started and to be given with each HD. - Ferric gluconate 125mg  qMWF with HD - Continue to monitor  Hypothyroidism:  TSH on 10/26/17: 2.884. On Synthroid. - Continue home synthroid.  GERD: - Continue home protonix 40mg  BID  Anxiety/Depression Chronic, stable. On home Wellbutrin, Cymbalta, Xanax PRN, and Trazodone. PRN home Xanax added due to some stated anxiety over medical treatment. Patient notes some increased anxiety yesterday but is doing better today. Needed one dose today. - Continue home Cymbalta, Wellbutrin, and Trazodone - Home Xanax PRN anxiety  FEN/GI: Renal diet with fluid restriction  Prophylaxis: Warfarin  Disposition: continued inpatient stay for diuresis   Subjective:  Patient today states she is hurting a lot at her catheter site. She has not taken anything for the pain but plans to take an Oxy. She also states she is very hungry as she has not been able to eat all day. She is starting to feel light headed. She notes she was very stressed and anxious yesterday but it has improved some today. Patent notes she has been voiding well and had a BM yesterday that was normal appearing. She is ambulating well without pain.  Objective: Temp:  [97.8 F (36.6 C)-99.7 F (37.6 C)] 98.5 F (36.9 C) (10/21 0516) Pulse Rate:  [74-90] 74 (10/21 0516) Resp:  [14-21] 16 (10/21 0516) BP: (141-171)/(76-93) 168/85 (10/21 0516) SpO2:  [94 %-100 %] 94 % (10/21 0516) Weight:  [58.6 kg] 58.6 kg (10/21 0516)  Physical Exam:  Gen: NAD, alert, non-toxic, well-appearing, laying in bed, appears tired Skin: Warm and dry. No obvious rashes, lesions, or trauma. HEENT: NCAT No conjunctival pallor or injection. No scleral icterus or injection.  MMM.  CV: RRR. <2s  capillary refill bilaterally.  RP & DPs 2+ bilaterally. 1-2+ pitting edema on the right LE up to ankle, trace edema on the left LE Resp: Bibasilar crackles No increased WOB Abd: Some tenderness to deep palpation diffusely along abdomen. Improved since admission after diuresis/HD. Positive bowel sounds. Psych: Cooperative with exam. Pleasant. Makes eye contact. Speech normal. Extremities: Moves all extremities spontaneously   Laboratory: Recent Labs  Lab 04/16/18 1805 04/18/18 0229 04/19/18 0651  WBC 6.9 7.5 7.1  HGB 8.7* 9.0* 9.7*  HCT 26.9* 27.3* 31.1*  PLT 281 292 292   Recent Labs  Lab 04/16/18 1805 04/18/18 0229 04/19/18 0651  NA 136 136 139  K 4.7 3.9 3.7  CL 100 102 104  CO2 21* 24 25  BUN 59* 56* 26*  CREATININE 6.03* 6.17* 4.12*  CALCIUM 9.4 9.1 9.3  PROT 6.3*  --   --   BILITOT 0.5  --   --   ALKPHOS 76  --   --   ALT 15  --   --   AST 16  --   --   GLUCOSE 229* 110* 104*    Ref. Range 04/18/2018 02:29  Iron Latest Ref Range: 28 - 170 ug/dL 18 (L)  UIBC Latest Units: ug/dL 206  TIBC Latest Ref Range: 250 - 450 ug/dL 224 (L)  Saturation Ratios Latest Ref Range: 10.4 - 31.8 % 8 (L)    Imaging/Diagnostic Tests: Dg Chest 2 View  Result Date: 04/16/2018 CLINICAL DATA:  67 y/o  F; chest pain. EXAM: CHEST - 2 VIEW COMPARISON:  03/16/2018 chest radiograph. FINDINGS: Stable cardiomegaly given projection and technique. Mitral valve replacement and PFO closure. Post CABG with sternotomy wires in alignment. Stable 3 lead pacemaker. Small bilateral pleural effusions and chronic pulmonary vascular congestion. No consolidation or pneumothorax. Calcific aortic atherosclerosis. Bones are unremarkable. IMPRESSION: Stable small bilateral pleural effusions, pulmonary vascular congestion, and cardiomegaly. Electronically Signed   By: Kristine Garbe M.D.   On: 04/16/2018 23:36    Danna Hefty, DO 04/19/2018, 8:19 AM PGY-2, [facility]  Intern pager: [phone], text pages welcome

## 2018-04-20 ENCOUNTER — Inpatient Hospital Stay (HOSPITAL_COMMUNITY): Payer: Medicare Other

## 2018-04-20 DIAGNOSIS — N185 Chronic kidney disease, stage 5: Secondary | ICD-10-CM

## 2018-04-20 LAB — CBC
HCT: 28.7 % — ABNORMAL LOW (ref 36.0–46.0)
Hemoglobin: 9.3 g/dL — ABNORMAL LOW (ref 12.0–15.0)
MCH: 24.2 pg — ABNORMAL LOW (ref 26.0–34.0)
MCHC: 32.4 g/dL (ref 30.0–36.0)
MCV: 74.5 fL — ABNORMAL LOW (ref 80.0–100.0)
Platelets: 298 10*3/uL (ref 150–400)
RBC: 3.85 MIL/uL — ABNORMAL LOW (ref 3.87–5.11)
RDW: 14.5 % (ref 11.5–15.5)
WBC: 8.2 10*3/uL (ref 4.0–10.5)
nRBC: 0 % (ref 0.0–0.2)

## 2018-04-20 LAB — RENAL FUNCTION PANEL
Albumin: 3.2 g/dL — ABNORMAL LOW (ref 3.5–5.0)
Anion gap: 9 (ref 5–15)
BUN: 32 mg/dL — ABNORMAL HIGH (ref 8–23)
CO2: 26 mmol/L (ref 22–32)
Calcium: 9.8 mg/dL (ref 8.9–10.3)
Chloride: 102 mmol/L (ref 98–111)
Creatinine, Ser: 4.94 mg/dL — ABNORMAL HIGH (ref 0.44–1.00)
GFR calc Af Amer: 10 mL/min — ABNORMAL LOW (ref >60)
GFR calc non Af Amer: 8 mL/min — ABNORMAL LOW (ref >60)
Glucose, Bld: 106 mg/dL — ABNORMAL HIGH (ref 70–99)
Phosphorus: 4.4 mg/dL (ref 2.5–4.6)
Potassium: 3.5 mmol/L (ref 3.5–5.1)
Sodium: 137 mmol/L (ref 135–145)

## 2018-04-20 LAB — HEPATITIS B SURFACE ANTIBODY,QUALITATIVE: Hep B S Ab: REACTIVE

## 2018-04-20 LAB — PROTIME-INR
INR: 1.37
Prothrombin Time: 16.7 seconds — ABNORMAL HIGH (ref 11.4–15.2)

## 2018-04-20 LAB — HEPARIN LEVEL (UNFRACTIONATED)
Heparin Unfractionated: 0.31 IU/mL (ref 0.30–0.70)
Heparin Unfractionated: 0.44 IU/mL (ref 0.30–0.70)

## 2018-04-20 LAB — HEPATITIS B CORE ANTIBODY, IGM: Hep B C IgM: NEGATIVE

## 2018-04-20 MED ORDER — CARVEDILOL 25 MG PO TABS
25.0000 mg | ORAL_TABLET | Freq: Two times a day (BID) | ORAL | Status: DC
  Administered 2018-04-20 – 2018-05-01 (×21): 25 mg via ORAL
  Filled 2018-04-20 (×21): qty 1

## 2018-04-20 MED ORDER — ONDANSETRON HCL 4 MG/2ML IJ SOLN
4.0000 mg | Freq: Three times a day (TID) | INTRAMUSCULAR | Status: DC | PRN
  Administered 2018-04-20 – 2018-04-27 (×5): 4 mg via INTRAVENOUS
  Administered 2018-04-27: 12:00:00 via INTRAVENOUS
  Administered 2018-04-28 (×2): 4 mg via INTRAVENOUS
  Filled 2018-04-20 (×5): qty 2

## 2018-04-20 NOTE — Progress Notes (Signed)
[place] for heparin Indication: atrial fibrillation  Allergies  Allergen Reactions  . Ace Inhibitors Anaphylaxis and Swelling  . Motrin Ib [Ibuprofen] Anaphylaxis    Patient Measurements: Height: 5\' 9"  (175.3 cm) Weight: 129 lb 6.4 oz (58.7 kg) IBW/kg (Calculated) : 66.2 Heparin Dosing Weight: 65.2 kg   Vital Signs: Temp: 98 F (36.7 C) (10/22 0823) Temp Source: Oral (10/22 0823) BP: 158/91 (10/22 0823) Pulse Rate: 79 (10/22 0823)  Labs: Recent Labs    04/17/18 0942 04/17/18 1416  04/18/18 0229 04/18/18 0717  04/18/18 2126 04/19/18 0651 04/19/18 1008 04/19/18 1422 04/20/18 0426  HGB  --   --    < > 9.0*  --   --   --  9.7*  --   --  9.3*  HCT  --   --   --  27.3*  --   --   --  31.1*  --   --  28.7*  PLT  --   --   --  292  --   --   --  292  --   --  298  LABPROT 21.5*  --   --   --  18.3*  --   --  16.7*  --   --  16.7*  INR 1.89  --   --   --  1.54  --   --  1.37  --   --  1.37  HEPARINUNFRC  --   --   --  <0.10*  --    < > 0.58  --  0.39  --  0.31  CREATININE  --   --    < > 6.17*  --   --   --  4.12*  --  4.29* 4.94*  TROPONINI 0.07* 0.06*  --   --   --   --   --   --   --   --   --    < > = values in this interval not displayed.    Estimated Creatinine Clearance: 10.4 mL/min (A) (by C-G formula based on SCr of 4.94 mg/dL (H)).   Medical History: Past Medical History:  Diagnosis Date  . Anemia    low iron  . Anxiety   . Arthritis   . Bronchitis   . CHF (congestive heart failure) ([place])   . CKD (chronic kidney disease) stage 4, GFR 15-29 ml/min (HCC) 03/16/2017  . Coronary artery disease   . Depression   . Diabetes mellitus without complication ([place])   . Diet-controlled diabetes mellitus ([place])   . Ganglion cyst    left wrist  . GERD (gastroesophageal reflux disease)   . Headache    chronic headaches  . Heart failure ([place])   . Hyperlipidemia   . Hypertension   . Hypothyroidism   . Mitral regurgitation    . Myocardial infarction ([place])    3x last one 2008  . PAF (paroxysmal atrial fibrillation) ([place])   . Pneumonia    x 2  . S/P Maze operation for atrial fibrillation 02/18/2018   Complete bilateral atrial lesion set using bipolar radiofrequency and cryothermy ablation with clipping of LA appendage  . S/P mitral valve replacement with bioprosthetic valve 02/18/2018   [facility] Mitral stented bovine pericardial tissue valve Model 7300 TFX Serial # [id number] Size 29  . Stroke [facility])    no lasting residual - ? 2014  . Umbilical hernia     Medications:  Scheduled:  . amLODipine  10 mg Oral Daily  . aspirin EC  81 mg Oral Daily  . atorvastatin  80 mg Oral Daily  . B-complex with vitamin C  1 tablet Oral Daily  . buPROPion  100 mg Oral TID  . busPIRone  15 mg Oral BID  . carvedilol  12.5 mg Oral BID  . Chlorhexidine Gluconate Cloth  6 each Topical Q0600  . Chlorhexidine Gluconate Cloth  6 each Topical Q0600  . cholecalciferol  1,000 Units Oral Daily  . doxazosin  8 mg Oral Daily   And  . doxazosin  4 mg Oral QHS  . DULoxetine  30 mg Oral Daily  . hydrALAZINE  100 mg Oral Q8H  . isosorbide mononitrate  120 mg Oral Daily  . levothyroxine  112 mcg Oral QAC breakfast  . montelukast  10 mg Oral Daily  . pantoprazole  40 mg Oral BID AC  . traZODone  25 mg Oral QHS    Assessment: 76 yoF on warfarin PTA for Afib s/p recent bioprosthetic MVR planning to undergo procedures. Pt reported home regimen as 2.5 mg daily (last dose 10/17 PM).   INR remains subtherapeutic at 1.37. Warfarin on hold for procedure.  Heparin level remains therapeutic at 0.31 on heparin 1100 units/hr. Minor bleeding noted at HD cath site. No other signs/symptoms of bleeding or issues with infusion reported.  Goal of Therapy:  INR 2-3 Heparin level 0.3-0.7 units/ml Monitor platelets by anticoagulation protocol: Yes   Plan:  Increase heparin infusion slightly to 1150 units/hr to ensure does not drop  subtherapeutic Recheck HL in 8 hours Monitor daily HL, CBC, and for s/sx of bleeding Follow plans for [facility] placement   Thanks for allowing pharmacy to be a part of this patient's care.   Claiborne Billings, PharmD PGY2 Cardiology Pharmacy Resident Phone [phone] Please check AMION for all Pharmacist numbers by unit 04/20/2018 8:59 AM

## 2018-04-20 NOTE — Progress Notes (Addendum)
Advanced Heart Failure Rounding Note  PCP-Cardiologist: [name], MD   Subjective:    Lasix increased to 160 mg q6 hours yesterday. She voided 1.7 L. Weight unchanged. Creatinine trending up 4.29 > 4.94.   Coreg increased yesterday for BP control. SBP 150-170s.  Feeling nauseated this morning. No vomiting or diarrhea. SOB mildly improved.   Objective:   Weight Range: 58.7 kg Body mass index is 19.11 kg/m.   Vital Signs:   Temp:  [97.5 F (36.4 C)-98.4 F (36.9 C)] 98 F (36.7 C) (10/22 0823) Pulse Rate:  [78-87] 79 (10/22 0823) Resp:  [15-16] 15 (10/22 0823) BP: (147-174)/(79-98) 158/91 (10/22 0823) SpO2:  [96 %-99 %] 99 % (10/22 0823) Weight:  [58.7 kg] 58.7 kg (10/22 0613) Last BM Date: 04/17/18  Weight change: Filed Weights   04/18/18 0449 04/19/18 0516 04/20/18 0613  Weight: 62.3 kg 58.6 kg 58.7 kg    Intake/Output:   Intake/Output Summary (Last 24 hours) at 04/20/2018 0846 Last data filed at 04/20/2018 0839 Gross per 24 hour  Intake 912.91 ml  Output 1900 ml  Net -987.09 ml      Physical Exam   General: No resp difficulty. Appears fatigued. Thin.  HEENT: Normal Neck: Supple. JVP to jaw. Carotids 2+ bilat; no bruits. No lymphadenopathy or thyromegaly appreciated. Cor: PMI nondisplaced. Regular rate & rhythm. No rubs, gallops or murmurs. HD cath in right groin.  Lungs: crackles in bases.  Abdomen: Soft, nontender, nondistended. No hepatosplenomegaly. No bruits or masses. Good bowel sounds. Extremities: No cyanosis, clubbing, rash, RLE non pitting edema. No edema LLE Neuro: Alert & orientedx3, cranial nerves grossly intact. moves all 4 extremities w/o difficulty. Affect pleasant   Telemetry   BiV paced with underlying NSR 80s. Personally reviewed.   EKG    No new tracings.   Labs    CBC Recent Labs    04/19/18 0651 04/20/18 0426  WBC 7.1 8.2  HGB 9.7* 9.3*  HCT 31.1* 28.7*  MCV 75.3* 74.5*  PLT 292 700   Basic Metabolic  Panel Recent Labs    04/19/18 1422 04/20/18 0426  NA 138 137  K 3.8 3.5  CL 103 102  CO2 25 26  GLUCOSE 89 106*  BUN 28* 32*  CREATININE 4.29* 4.94*  CALCIUM 9.4 9.8  PHOS  --  4.4   Liver Function Tests Recent Labs    04/20/18 0426  ALBUMIN 3.2*   No results for input(s): LIPASE, AMYLASE in the last 72 hours. Cardiac Enzymes Recent Labs    04/17/18 0942 04/17/18 1416  TROPONINI 0.07* 0.06*    BNP: BNP (last 3 results) Recent Labs    02/17/18 0353 03/11/18 1458 04/16/18 1805  BNP 675.7* 1,452.6* 2,442.0*    ProBNP (last 3 results) No results for input(s): PROBNP in the last 8760 hours.   D-Dimer No results for input(s): DDIMER in the last 72 hours. Hemoglobin A1C No results for input(s): HGBA1C in the last 72 hours. Fasting Lipid Panel No results for input(s): CHOL, HDL, LDLCALC, TRIG, CHOLHDL, LDLDIRECT in the last 72 hours. Thyroid Function Tests No results for input(s): TSH, T4TOTAL, T3FREE, THYROIDAB in the last 72 hours.  Invalid input(s): FREET3  Other results:   Imaging     No results found.   Medications:     Scheduled Medications: . amLODipine  10 mg Oral Daily  . aspirin EC  81 mg Oral Daily  . atorvastatin  80 mg Oral Daily  . B-complex with vitamin C  1 tablet Oral Daily  . buPROPion  100 mg Oral TID  . busPIRone  15 mg Oral BID  . carvedilol  12.5 mg Oral BID  . Chlorhexidine Gluconate Cloth  6 each Topical Q0600  . Chlorhexidine Gluconate Cloth  6 each Topical Q0600  . cholecalciferol  1,000 Units Oral Daily  . doxazosin  8 mg Oral Daily   And  . doxazosin  4 mg Oral QHS  . DULoxetine  30 mg Oral Daily  . hydrALAZINE  100 mg Oral Q8H  . isosorbide mononitrate  120 mg Oral Daily  . levothyroxine  112 mcg Oral QAC breakfast  . montelukast  10 mg Oral Daily  . pantoprazole  40 mg Oral BID AC  . traZODone  25 mg Oral QHS     Infusions: . ferric gluconate (FERRLECIT/NULECIT) IV    . furosemide 66 mL/hr at 04/20/18  0708  . heparin 1,100 Units/hr (04/20/18 0708)     PRN Medications:  acetaminophen, albuterol, ALPRAZolam, oxyCODONE    Patient Profile   Jocelyn Hill. Broweris a67 y.o.femalewith h/o mitral regurgitation s/p bioprosthetic MVR 11/1441, chronic diastolic CHF, CAD s/p CABG with LIMA-LAD and SVG-LCx 01/2018, long standing HTN, DM2, CKD IV-V, h/o Stroke, anxiety, depression, GERD. Admitted in 8/19 for Bioprosthetic MVR, Maze, LA appendage clip, PFO closure, and CABG with LIMA-LAD and SVG-LCx. This was complicated by AKI and complete heart block. St Jude CRT-P device placed. Echo in 8/19 post-op showed EF 45-50%, stable bioprosthetic mitral valve.   She was sent to ED 04/16/18 with A/C HF and creatinine 6.0.   Assessment/Plan    1. Acute on chronic combined CHF: Echo (8/19, post-op) with EF 45-50%. Has St Jude CRT-P device due to post-op complete heart block.  - Echo this admission with lower EF 30-35%. - Remains volume overloaded.  - Remains on IV lasix 160 mg q6 hours with modest UOP. I think she will need HD again soon. Creatinine trending up 4.94 - Continue coreg 12.5 mg BID - Continue hydralazine 100 mg q8 and imdur 120 mg daily - No spiro with CKD IV - No ACE with hx of angioedema/CKD IV 2. CKD Stage IV: Had R hydronephrosis in setting of hydronephrosis and required percutaneous drain in 9/18 (removed). Renal US 04/14/17 showed no further evidence of hydronephrosis. - Creatinine peaked at 6.17. - Had HD with 2 L off 10/20. Lasix increased yesterday with modest UOP. Creatinine trending up 4.94 today.  - Coumadin on hold. Remains on heparin for ?tunneled HD cath vs AVF? 3. [id number] elevated with SBP 150-170s. I think HD will help with BP.  - Continue hydralazine 100 mg tid.  - Continue amlodipine 10 mg daily.  -Continue coreg 12.5 mg BID.  - Continue doxazosin 8 qam/4 qpm. 4. 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 Had bioprosthetic MVR in 8/19. Post-op echo  in 8/19 showed stable bioprosthetic mitral valve. Echo this admission showed mean gradient 5 mm Hg. No change.  5. Atrial fibrillation: Paroxysmal. Now s/p Maze in 8/19. Remains in NSR - Coumadin held. She is on heparin drip now for perm HD cath placement (has HD cath in groin) 6. CAD: LHC in 1/19 showed severe ostial LCx disease and moderate LAD disease. She had LIMA-LAD and SVG-OM in 8/19.  - She has chronic atypical CP. Continues to have CP on and off, lasts about 30 minutes. Unrelated to activity. Troponin trend flat. EKG unchanged. Not thought to be ischemic.  - Continue ASA 81 and atorvastatin 80 mg daily.Good lipids  03/2018 -ContinueImdur 120 mg daily. Continue Coreg 12.5 mg BID 7. Disposition - PT recommending [facility] PT.   Medication concerns reviewed with patient and pharmacy team. Barriers identified: none  Length of Stay: [place], NP  04/20/2018, 8:46 AM  Advanced Heart Failure Team Pager [phone] (M-F; 7a - 4p)  Please contact [facility] for night-coverage after hours (4p -7a ) and weekends on [email or web address]  Patient seen with NP, agree with the above note.  BP remains high.  She remains volume overloaded, some diuresis with high dose IV Lasix.  Creatinine rising but noted that BUN is not impressively high.   - Diuretics per renal.  - Suspect she will ultimately be committed to HD though agree she does not have emergent need today with continued UOP.   She is off warfarin and on heparin gtt while HD access is worked out.   BP remains high, agree with increase Coreg to 25 mg bid today.   [name] 04/20/2018 1:55 PM

## 2018-04-20 NOTE — Progress Notes (Signed)
Family Medicine Teaching Service Daily Progress Note Intern Pager: [phone]  Patient name: Jocelyn Hill Medical record number: [id number] Date of birth: 1951-01-20 Age: 67 y.o. Gender: female  Primary Care Provider: [name], MD Consultants: Cardiology, Nephrology  Code Status: Full   Pt Overview and Major Events to Date:  Admitted to [facility] 04/17/2018 Tunneled HD cath placed 10/21  Assessment and Plan: Jocelyn Hill a 67 y.o.femalepresenting from cardiology officewith elevated creatinine and chest pain. PMH is significant forCHF, CKD IV, HTN,CAD s/p MI and CABG (01/2018),hypothyroidism, paroxysmalA-fibs/p MAZE (01/2018),mitral regurgitation s/p mitral valve replacement (01/2018), microcytic anemia, anxiety/depression.  HFrEF exacerbation - kidneys could not tolerate outpatient diuresis and patient was started on HD this admission -cardiology and nephrology following, continue high dose IV lasix and intermittent HD - Daily weights, Strict I&O's, continue fluid restriction  - continue coreg, hydralazine, and imdur per cardiology recommendations   CKDV- S/p temporary femoral HD cath - Nephrology following - Continue Oxy 5mg  IR q4 hours PRN pain at cath site - Tylenol PRN pain - follow renal function labs -zofran as needed nausea  Atypical ChestPain: Chronic, Stable.. S/p CABG with LIMA-LAD and SVG-LCx on 01/2018. Patient has history of CAD s/p MI in August 2019 with CABG and biventricular pacemaker in place 2/2 to complete heart block. Troponins with low flat trend, per cardiology not c/w ACS. Likely demand ischemia in setting of acute CHF exacerbation and anemia and worsening renal function - Continuous cardiac monitor  - Contiuous pulse ox with O2 PRN - Continue Aspirin 81mg  - Continue home Atorvastatin, Coreg, doxazosin, hydralazine, Imdur - Hold home warfarin, on IV Heparin for cath placement - pharmacy managing   History of Mitral regurgitation s/p mitral  valve replacement Chronic, stable. 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 Biprosthetic MV replacement in August 2019. Post-op echo was stable. Echo unchanged in regards to MV.  Atrial Fibrillation s/p MAZE Chronic, stable. MAZE procedure in August 2019. No recurrence since.  - Hold warfarin, on IV heparin for HD cath placement  HTN: Remains hypertensive. AM BP of 161/98. On home hydralazine, amlodipine, Coreg, doxazosin. - continue increased Coreg to 12.5mg  BID per HF recs - Continue hydralazine, doxazosin, and Amlodipine  Microcytic Anemia: chroic, stable -Currently stable, continue to monitor -Ferric gluconate 125mg  qMWF with HD  Hypothyroidism:  TSH on 10/26/17: 2.884. On Synthroid. - Continue home synthroid.  GERD: - Continue home protonix 40mg  BID  Anxiety/Depression Chronic, stable. On home Wellbutrin, Cymbalta, Xanax PRN, and Trazodone. PRN home Xanax added due to some stated anxiety over medical treatment. - Continue home Cymbalta, Wellbutrin, and Trazodone - Home Xanax PRN anxiety  FEN/GI: Renal diet with fluid restriction  Prophylaxis: Warfarin  Disposition: continued inpatient stay, ultimately will d/c to SNF  Subjective:  Endorsing nausea this morning, no vomiting. Has chest pain that is chronic and pain at her cath site. Denies SOB  Objective: Temp:  [97.5 F (36.4 C)-98.4 F (36.9 C)] 98 F (36.7 C) (10/22 0823) Pulse Rate:  [78-87] 79 (10/22 0823) Resp:  [15-16] 15 (10/22 0823) BP: (147-174)/(79-98) 158/91 (10/22 0823) SpO2:  [96 %-99 %] 99 % (10/22 0823) Weight:  [58.7 kg] 58.7 kg (10/22 4332)  Physical Exam:   Gen: appears older than stated age, AA lady in NAD Heart: RRR no MRG Lungs: CTA bilaterally, no increased work of breathing Abdomen: soft, non-distended Extremities: no edema or cyanosis Neuro: no focal deficits  Laboratory: Recent Labs  Lab 04/18/18 0229 04/19/18 0651 04/20/18 0426  WBC 7.5 7.1 8.2  HGB 9.0* 9.7* 9.3*  HCT 27.3*  31.1* 28.7*  PLT 292 292 298   Recent Labs  Lab 04/16/18 1805  04/19/18 0651 04/19/18 1422 04/20/18 0426  NA 136   < > 139 138 137  K 4.7   < > 3.7 3.8 3.5  CL 100   < > 104 103 102  CO2 21*   < > 25 25 26   BUN 59*   < > 26* 28* 32*  CREATININE 6.03*   < > 4.12* 4.29* 4.94*  CALCIUM 9.4   < > 9.3 9.4 9.8  PROT 6.3*  --   --   --   --   BILITOT 0.5  --   --   --   --   ALKPHOS 76  --   --   --   --   ALT 15  --   --   --   --   AST 16  --   --   --   --   GLUCOSE 229*   < > 104* 89 106*   < > = values in this interval not displayed.    Ref. Range 04/18/2018 02:29  Iron Latest Ref Range: 28 - 170 ug/dL 18 (L)  UIBC Latest Units: ug/dL 206  TIBC Latest Ref Range: 250 - 450 ug/dL 224 (L)  Saturation Ratios Latest Ref Range: 10.4 - 31.8 % 8 (L)    Imaging/Diagnostic Tests: Dg Chest 2 View  Result Date: 04/16/2018 CLINICAL DATA:  67 y/o  F; chest pain. EXAM: CHEST - 2 VIEW COMPARISON:  03/16/2018 chest radiograph. FINDINGS: Stable cardiomegaly given projection and technique. Mitral valve replacement and PFO closure. Post CABG with sternotomy wires in alignment. Stable 3 lead pacemaker. Small bilateral pleural effusions and chronic pulmonary vascular congestion. No consolidation or pneumothorax. Calcific aortic atherosclerosis. Bones are unremarkable. IMPRESSION: Stable small bilateral pleural effusions, pulmonary vascular congestion, and cardiomegaly. Electronically Signed   By: Kristine Garbe M.D.   On: 04/16/2018 23:36    Lucila Maine, DO PGY-3, [facility] 04/20/2018 9:44 AM  FPTS Intern pager: [phone], text pages welcome

## 2018-04-20 NOTE — Progress Notes (Signed)
Spoke with Dr. Clover Mealy to clarify that she did want HD cath removed and she stated yes to remove. Carroll Kinds RN

## 2018-04-20 NOTE — Progress Notes (Signed)
PT Cancellation Note  Patient Details Name: Jocelyn Hill MRN: [id number] DOB: May 16, 1951   Cancelled Treatment:    Reason Eval/Treat Not Completed: Medical issues which prohibited therapy. Pt with temporary femoral catheter, awaiting removal this afternoon. Will follow-up for PT treatment once removed as schedule permits. If not to be removed, please provide OOB mobility orders if ok to mobilize out of bed for PT treatment with catheter in place.  Mabeline Caras, PT, DPT Acute Rehabilitation Services  Pager [phone] Office [place] 04/20/2018, 3:00 PM

## 2018-04-20 NOTE — Progress Notes (Signed)
Family Medicine Teaching Service Daily Progress Note Intern Pager: [phone]  Patient name: Jocelyn Hill Medical record number: [id number] Date of birth: 05-May-1951 Age: 67 y.o. Gender: female  Primary Care Provider: [name], MD Consultants: Cardiology, Nephrology  Code Status: Full   Pt Overview and Major Events to Date:  04/17/18 - Admitted to [facility]  04/18/18 - Echo EF 30-35% 04/19/18 - Temporary HD cath placed  04/20/18 - Temp cath removed   Assessment and Plan: Jocelyn Hill a 67 y.o.femalepresenting from cardiology officewith elevated creatinine and chest pain. PMH is significant forCHF, CKD IV, HTN,CAD s/p MI and CABG (01/2018),hypothyroidism, paroxysmalA-fibs/p MAZE (01/2018),mitral regurgitation s/p mitral valve replacement (01/2018), microcytic anemia, anxiety/depression.  "Lightheadedness": Endorsed some chronic lightheadedness that is constant. BG stable in the 100's. BP elevated in the 150's/80's. Not positional so does not appear to be orthostatic. 98-100% O2 sats on [place]. Multiple EKG's NSR. HR in the 60-70's. Unknown etiology at this time. Adjusted chronic anxiety/depression medications due to BEERs criteria. Will continue to monitor. - Decrease Cymbalta to 20mg  - Continue to monitor  HFrEF exacerbation - Kidneys could not tolerate outpatient diuresis and patient was started on HD this admission. Bilateral UE vein mapping completed for placement of perm cath. Femoral cath removed yesterday 2/2 to pain. Per nephro, will place [facility] by IR if another HD needed during admission. For now, will continue to diurese with high dose lasix. UOP 1.65L. Wt down from yesterday (129.4>124.7). Wt on admission 143.7. PT/OT consulted and recommend SNF.   - Cardiology and nephrology following - Continue high dose IV lasix and observe - Daily weights/Strict I&O's - Continue fluid restriction  - Continue coreg, hydralazine, and Imdur per cards recommendations  - PT/OT  recommend SNF, rolling walker w/ 5" wheels and 3n1  CKD-V: LE edema appears to be improving. Femoral cath removed 2/2 to pain. Will add TDC if further HD required. Will continue high dose lasix at this time. Creatinine slightly worsened overnight (Cr 4.12>4.29>4.94>5.48). K+ 3.4. Will continue to follow. - Nephrology following  - Consider chronic HD pending status over next 48 hrs - Continue Oxy 5mg  IR q4 hours PRN pain  - Tylenol PRN pain - follow renal function labs - zofran as needed nausea  Atypical ChestPain: Chronic, Stable.. S/p CABG with LIMA-LAD and SVG-LCx on 01/2018. Patient has history of CAD s/p MI in August 2019 with CABG and biventricular pacemaker in place 2/2 to complete heart block. Troponins with low flat trend, per cardiology not c/w ACS. Likely demand ischemia in setting of acute CHF exacerbation and anemia and worsening renal function . Echo 30-35%, G2DD. Patient has chronic atypical chest pain, continues to have CP on and off.  - Continuous cardiac monitor  - Contiuous pulse ox with O2 PRN - Continue Aspirin 81mg  - Continue home Atorvastatin, Coreg, doxazosin, hydralazine, Imdur - Hold home warfarin, on IV Heparin for potential cath placement - pharmacy managing   HTN: Remains hypertensive. AM BP of 157/88. On home hydralazine, amlodipine, Coreg, doxazosin. Per nephro, Coreg and doxazosin increased due to persistent HTN. - Coreg increased to 6.25>12.5>25mg  - Doxazosin increased to 4>8mg  - Continue hydralazine and Amlodipine  History of Mitral regurgitation s/p mitral valve replacement Chronic, stable. 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 Biprosthetic MV replacement in August 2019. Post-op echo was stable. Echo unchanged in regards to MV.  Atrial Fibrillation s/p MAZE Chronic, stable. MAZE procedure in August 2019. No recurrence since.  - Hold warfarin, on IV heparin for HD potential TDC placement  Microcytic Anemia: chroic, stable Currently stable at 9.0. Will  continue to monitor -Ferric gluconate 125mg  qMWF with HD  Hypothyroidism:  TSH on 10/26/17: 2.884. On Synthroid. - Continue home synthroid.  GERD: - Continue home protonix 40mg  BID  Anxiety/Depression Chronic, stable. On home Wellbutrin, Cymbalta, Xanax PRN, and Trazodone. According to BEERS criteria, not recommended to be on Cymbalta or Wellbutrin. Decreased Cymbalta to 20mg . - Decrease Cymbalta from 30 to 20mg  QD - Continue home Wellbutrin and Trazodone - Home Xanax PRN qHS anxiety   FEN/GI: Renal diet with fluid restriction  Prophylaxis: Warfarin  Disposition: continued inpatient stay, ultimately will d/c to SNF  Subjective:  Endorses constant diiffuse abdominal pain, better with protonix and food. Started ~3 days ago. No nausea or vomiting.  Had BM yesterday that was firm and brown appearing. Has chest pain that is chronic, "burning" on the right chest.  Some light headedness that is chronic.  Objective: Temp:  [98.4 F (36.9 C)-98.9 F (37.2 C)] 98.9 F (37.2 C) (10/23 1342) Pulse Rate:  [67-69] 67 (10/23 1342) Resp:  [17-18] 18 (10/23 1342) BP: (127-157)/(75-88) 127/75 (10/23 1342) SpO2:  [98 %-100 %] 98 % (10/23 1342) Weight:  [56.6 kg] 56.6 kg (10/23 0426)  Physical Exam:  Gen: NAD, alert, non-toxic, well-appearing, sitting comfortably on side of bed Skin: Warm and dry. No obvious rashes, lesions, or trauma. HEENT: NCAT No conjunctival pallor or injection. No scleral icterus or injection.  MMM.  CV: RRR.  <2s capillary refill bilaterally.  RP & DPs 2+ bilaterally. 2+ pitting edema on the right LE, no edema note don left LE. Resp: CTAB.  No wheezing, rales, abnormal lung sounds.  No increased WOB Abd: Abdomen tender to palpation diffusely, slightly more firm on exam compared to yesterday, Positive bowel sounds. Psych: Cooperative with exam. Pleasant. Makes eye contact. Speech normal. Extremities: Moves all extremities spontaneously   Laboratory: Recent Labs   Lab 04/19/18 0651 04/20/18 0426 04/21/18 0408  WBC 7.1 8.2 7.5  HGB 9.7* 9.3* 9.0*  HCT 31.1* 28.7* 27.6*  PLT 292 298 279   Recent Labs  Lab 04/16/18 1805  04/19/18 1422 04/20/18 0426 04/21/18 0408  NA 136   < > 138 137 137  K 4.7   < > 3.8 3.5 3.4*  CL 100   < > 103 102 100  CO2 21*   < > 25 26 26   BUN 59*   < > 28* 32* 37*  CREATININE 6.03*   < > 4.29* 4.94* 5.48*  CALCIUM 9.4   < > 9.4 9.8 9.8  PROT 6.3*  --   --   --   --   BILITOT 0.5  --   --   --   --   ALKPHOS 76  --   --   --   --   ALT 15  --   --   --   --   AST 16  --   --   --   --   GLUCOSE 229*   < > 89 106* 100*   < > = values in this interval not displayed.    Ref. Range 04/18/2018 02:29  Iron Latest Ref Range: 28 - 170 ug/dL 18 (L)  UIBC Latest Units: ug/dL 206  TIBC Latest Ref Range: 250 - 450 ug/dL 224 (L)  Saturation Ratios Latest Ref Range: 10.4 - 31.8 % 8 (L)    Imaging/Diagnostic Tests: Dg Chest 2 View  Result Date: 04/16/2018 CLINICAL DATA:  67  y/o  F; chest pain. EXAM: CHEST - 2 VIEW COMPARISON:  03/16/2018 chest radiograph. FINDINGS: Stable cardiomegaly given projection and technique. Mitral valve replacement and PFO closure. Post CABG with sternotomy wires in alignment. Stable 3 lead pacemaker. Small bilateral pleural effusions and chronic pulmonary vascular congestion. No consolidation or pneumothorax. Calcific aortic atherosclerosis. Bones are unremarkable. IMPRESSION: Stable small bilateral pleural effusions, pulmonary vascular congestion, and cardiomegaly. Electronically Signed   By: Kristine Garbe M.D.   On: 04/16/2018 23:36   Mina Marble, DO PGY-1, [facility] 04/21/2018 1:49 PM [place] Intern pager: [phone], text pages welcome

## 2018-04-20 NOTE — Progress Notes (Addendum)
Bilateral upper extremity vein mapping completed.

## 2018-04-20 NOTE — Progress Notes (Signed)
[facility] starting patient's [facility] authorization today, in anticipation of discharge this week. Facility will need updated PT notes to submit to insurance. CSW to follow for medical readiness and will support with discharge planning.  Estanislado Emms, [place]

## 2018-04-20 NOTE — Progress Notes (Signed)
ANTICOAGULATION CONSULT NOTE - Follow Up Consult  Pharmacy Consult for Heparin  Indication: atrial fibrillation  Allergies  Allergen Reactions  . Ace Inhibitors Anaphylaxis and Swelling  . Motrin Ib [Ibuprofen] Anaphylaxis    Patient Measurements: Height: 5\' 9"  (175.3 cm) Weight: 129 lb 6.4 oz (58.7 kg) IBW/kg (Calculated) : 66.2 Heparin Dosing Weight: 58.7 kg  Vital Signs: Temp: 98.9 F (37.2 C) (10/22 1445) Temp Source: Oral (10/22 0823) BP: 127/78 (10/22 1445) Pulse Rate: 68 (10/22 1445)  Labs: Recent Labs    04/18/18 0229 04/18/18 0717  04/19/18 0651 04/19/18 1008 04/19/18 1422 04/20/18 0426 04/20/18 1826  HGB 9.0*  --   --  9.7*  --   --  9.3*  --   HCT 27.3*  --   --  31.1*  --   --  28.7*  --   PLT 292  --   --  292  --   --  298  --   LABPROT  --  18.3*  --  16.7*  --   --  16.7*  --   INR  --  1.54  --  1.37  --   --  1.37  --   HEPARINUNFRC <0.10*  --    < >  --  0.39  --  0.31 0.44  CREATININE 6.17*  --   --  4.12*  --  4.29* 4.94*  --    < > = values in this interval not displayed.    Estimated Creatinine Clearance: 10.4 mL/min (A) (by C-G formula based on SCr of 4.94 mg/dL (H)).  Assessment:  74 yoF on warfarin PTA for Afib s/p recent bioprosthetic MVR planning to undergo procedures. Pt reported home regimen as 2.5 mg daily (last dose 10/17 PM).    INR remains subtherapeutic at 1.37. Warfarin on hold for possible [facility] for HD.   Heparin level is therapeutic (0.44) tonight on 1150 units/hr.  Goal of Therapy:  Heparin level 0.3-0.7 units/ml Monitor platelets by anticoagulation protocol: Yes   Plan:   Continue heparin drip at 1150 units/hr.  Daily heparin level, PT/INR and CBC.  Follow up plans for possible [facility] placement.  Coumadin remains on hold.  Arty Baumgartner, [place] Pager: [phone]  04/20/2018,7:32 PM

## 2018-04-20 NOTE — Progress Notes (Signed)
Subjective:   had 1700 of UOP on high dose lasix.  She is c/o pain where the temp HD cath is and of chest pain, headache , some  Nausea.   Kidney numbers did worsen from yesterday but BUN only 32- not usually to the level where pts have uremic sxms  - BP is high  Objective Vital signs in last 24 hours: Vitals:   04/19/18 2100 04/20/18 0000 04/20/18 0613 04/20/18 0823  BP: (!) 156/79 (!) 174/91 (!) 161/98 (!) 158/91  Pulse: 81  78 79  Resp:    15  Temp: 98.1 F (36.7 C)  98.4 F (36.9 C) 98 F (36.7 C)  TempSrc: Oral  Oral Oral  SpO2: 99%  99% 99%  Weight:   58.7 kg   Height:       Weight change: 0.091 kg  Intake/Output Summary (Last 24 hours) at 04/20/2018 1031 Last data filed at 04/20/2018 0936 Gross per 24 hour  Intake 1032.91 ml  Output 1900 ml  Net -867.09 ml    Assessment/ Plan: Pt is a 67 y.o. yo female with advanced CKD at baseline who was admitted on 04/16/2018 with what appeared to be uremic symptoms with BUN in the high 50's  Assessment/Plan: 1. Renal- felt to be uremic but BUN only 50's- s/p temp cath and first HD on 10/20- was planning for another treatment 10/21 however,   making good urine and BUN only 26- I decided to observe, numbers slightly worse today but still not to a level where I would think completely explain sxms. Cont to hold on HD today - because femoral cath will limit mobility want to remove it.  IF needs HD again this admit will place [facility] per IR.Also will get vein map and consider AVF this admit   2. Anemia- hgb trending up without transfusion- 9.3 today - no action  3. Secondary hyperparathyroidism- has been on nutritional vit D only - phos OK , pth pending 4. HTN/volume- BP high- as OP on amlodipine, coreg (will inc), cardura, hydralazine and high dose diuretics - to be continued - is this why she has chest pain and headache ??  Consider adding aldactone if no better tomorrow    Louis Meckel    Labs: Basic Metabolic Panel: Recent Labs   Lab 04/19/18 0651 04/19/18 1422 04/20/18 0426  NA 139 138 137  K 3.7 3.8 3.5  CL 104 103 102  CO2 25 25 26   GLUCOSE 104* 89 106*  BUN 26* 28* 32*  CREATININE 4.12* 4.29* 4.94*  CALCIUM 9.3 9.4 9.8  PHOS  --   --  4.4   Liver Function Tests: Recent Labs  Lab 04/16/18 1805 04/20/18 0426  AST 16  --   ALT 15  --   ALKPHOS 76  --   BILITOT 0.5  --   PROT 6.3*  --   ALBUMIN 3.3* 3.2*   No results for input(s): LIPASE, AMYLASE in the last 168 hours. No results for input(s): AMMONIA in the last 168 hours. CBC: Recent Labs  Lab 04/16/18 1805 04/18/18 0229 04/19/18 0651 04/20/18 0426  WBC 6.9 7.5 7.1 8.2  HGB 8.7* 9.0* 9.7* 9.3*  HCT 26.9* 27.3* 31.1* 28.7*  MCV 76.2* 74.0* 75.3* 74.5*  PLT 281 292 292 298   Cardiac Enzymes: Recent Labs  Lab 04/16/18 1805 04/16/18 2335 04/17/18 0320 04/17/18 0942 04/17/18 1416  TROPONINI 0.07* 0.07* 0.07* 0.07* 0.06*   CBG: Recent Labs  Lab 04/19/18 1412  GLUCAP 97  Iron Studies:  Recent Labs    04/18/18 0229  IRON 18*  TIBC 224*   Studies/Results: No results found. Medications: Infusions: . ferric gluconate (FERRLECIT/NULECIT) IV    . furosemide 66 mL/hr at 04/20/18 0708  . heparin 1,100 Units/hr (04/20/18 0708)    Scheduled Medications: . amLODipine  10 mg Oral Daily  . aspirin EC  81 mg Oral Daily  . atorvastatin  80 mg Oral Daily  . B-complex with vitamin C  1 tablet Oral Daily  . buPROPion  100 mg Oral TID  . busPIRone  15 mg Oral BID  . carvedilol  12.5 mg Oral BID  . Chlorhexidine Gluconate Cloth  6 each Topical Q0600  . Chlorhexidine Gluconate Cloth  6 each Topical Q0600  . cholecalciferol  1,000 Units Oral Daily  . doxazosin  8 mg Oral Daily   And  . doxazosin  4 mg Oral QHS  . DULoxetine  30 mg Oral Daily  . hydrALAZINE  100 mg Oral Q8H  . isosorbide mononitrate  120 mg Oral Daily  . levothyroxine  112 mcg Oral QAC breakfast  . montelukast  10 mg Oral Daily  . pantoprazole  40 mg Oral  BID AC  . traZODone  25 mg Oral QHS    have reviewed scheduled and prn medications.  Physical Exam: General: flat, c/o leg pain- difficult to read Heart: RRR Lungs: mostly clear- dec BS at extreme bases Abdomen: distended but better Extremities:  Really no periph edema Dialysis Access: right femoral HD cath in place minimal bruising     04/20/2018,10:31 AM  LOS: 3 days

## 2018-04-20 NOTE — Progress Notes (Signed)
Occupational Therapy Treatment Patient Details Name: Jocelyn Hill MRN: [id number] DOB: [birth year] Today's Date: 04/20/2018    History of present illness 67 yo female admitted with CP leg edema and SOB. pt with positive for weight gain noted PMH: 01/2018 CABG/ MVR/ PFO/ BIV Pacer,    OT comments  Pt reports stomach pain during session but up in chair to eat this session. Pt don robe but declines LB clothing at this time due to port in R LE. Pt requires min (A) hand held (A) for transfer.    Follow Up Recommendations  SNF    Equipment Recommendations  None recommended by OT    Recommendations for Other Services      Precautions / Restrictions Precautions Precautions: Fall       Mobility Bed Mobility Overal bed mobility: Needs Assistance Bed Mobility: Supine to Sit     Supine to sit: Supervision        Transfers Overall transfer level: Needs assistance Equipment used: 1 person hand held assist Transfers: Sit to/from Stand Sit to Stand: Min assist         General transfer comment: pt with hand held (A)     Balance Overall balance assessment: Needs assistance   Sitting balance-Leahy Scale: Good       Standing balance-Leahy Scale: Fair                             ADL either performed or assessed with clinical judgement   ADL Overall ADL's : Needs assistance/impaired Eating/Feeding: Modified independent   Grooming: Wash/dry hands;Modified independent                   Toilet Transfer: Minimal assistance           Functional mobility during ADLs: Minimal assistance General ADL Comments: pt agreeable to OOB to chair for breakfast. pt with c/o stomach pain on arrival. pt attempting to eat once in chair and had declined when supine.      Vision       Perception     Praxis      Cognition Arousal/Alertness: Awake/alert Behavior During Therapy: WFL for tasks assessed/performed Overall Cognitive Status: Within Functional  Limits for tasks assessed                                          Exercises     Shoulder Instructions       General Comments VSS    Pertinent Vitals/ Pain       Pain Assessment: Faces Faces Pain Scale: Hurts little more Pain Location: stomach Pain Descriptors / Indicators: Constant Pain Intervention(s): Monitored during session;Premedicated before session;Repositioned  Home Living                                          Prior Functioning/Environment              Frequency  Min 2X/week        Progress Toward Goals  OT Goals(current goals can now be found in the care plan section)  Progress towards OT goals: Progressing toward goals  Acute Rehab OT Goals Patient Stated Goal: to go back to rehab OT Goal Formulation: With patient Time For Goal Achievement: 05/02/18  Potential to Achieve Goals: Good ADL Goals Additional ADL Goal #1: pt will demonstrate 2 EC techniques during adls Additional ADL Goal #2: pt will complete adl mod I at sink level with seat position and 2 rest breaks  Plan Discharge plan remains appropriate    Co-evaluation                 AM-PAC PT "6 Clicks" Daily Activity     Outcome Measure   Help from another person eating meals?: None Help from another person taking care of personal grooming?: None Help from another person toileting, which includes using toliet, bedpan, or urinal?: None Help from another person bathing (including washing, rinsing, drying)?: A Little Help from another person to put on and taking off regular upper body clothing?: None Help from another person to put on and taking off regular lower body clothing?: None 6 Click Score: 23    End of Session    OT Visit Diagnosis: Unsteadiness on feet (R26.81);Muscle weakness (generalized) (M62.81)   Activity Tolerance Patient tolerated treatment well   Patient Left in chair;with call bell/phone within reach;with chair alarm set    Nurse Communication Mobility status;Precautions        Time: 1610-9604 OT Time Calculation (min): 17 min  Charges: OT General Charges $OT Visit: 1 Visit OT Treatments $Self Care/Home Management : 8-22 mins   Jeri Modena, OTR/L  Acute Rehabilitation Services Pager: [phone] Office: [phone] .    Parke Poisson B 04/20/2018, 2:56 PM

## 2018-04-21 ENCOUNTER — Encounter (HOSPITAL_COMMUNITY): Payer: Self-pay

## 2018-04-21 DIAGNOSIS — N184 Chronic kidney disease, stage 4 (severe): Secondary | ICD-10-CM

## 2018-04-21 DIAGNOSIS — I2510 Atherosclerotic heart disease of native coronary artery without angina pectoris: Secondary | ICD-10-CM

## 2018-04-21 DIAGNOSIS — I10 Essential (primary) hypertension: Secondary | ICD-10-CM

## 2018-04-21 LAB — RENAL FUNCTION PANEL
Albumin: 3.1 g/dL — ABNORMAL LOW (ref 3.5–5.0)
Anion gap: 11 (ref 5–15)
BUN: 37 mg/dL — ABNORMAL HIGH (ref 8–23)
CO2: 26 mmol/L (ref 22–32)
Calcium: 9.8 mg/dL (ref 8.9–10.3)
Chloride: 100 mmol/L (ref 98–111)
Creatinine, Ser: 5.48 mg/dL — ABNORMAL HIGH (ref 0.44–1.00)
GFR calc Af Amer: 9 mL/min — ABNORMAL LOW (ref >60)
GFR calc non Af Amer: 7 mL/min — ABNORMAL LOW (ref >60)
Glucose, Bld: 100 mg/dL — ABNORMAL HIGH (ref 70–99)
Phosphorus: 5 mg/dL — ABNORMAL HIGH (ref 2.5–4.6)
Potassium: 3.4 mmol/L — ABNORMAL LOW (ref 3.5–5.1)
Sodium: 137 mmol/L (ref 135–145)

## 2018-04-21 LAB — CBC
HCT: 27.6 % — ABNORMAL LOW (ref 36.0–46.0)
Hemoglobin: 9 g/dL — ABNORMAL LOW (ref 12.0–15.0)
MCH: 24.3 pg — ABNORMAL LOW (ref 26.0–34.0)
MCHC: 32.6 g/dL (ref 30.0–36.0)
MCV: 74.6 fL — ABNORMAL LOW (ref 80.0–100.0)
Platelets: 279 10*3/uL (ref 150–400)
RBC: 3.7 MIL/uL — ABNORMAL LOW (ref 3.87–5.11)
RDW: 14.5 % (ref 11.5–15.5)
WBC: 7.5 10*3/uL (ref 4.0–10.5)
nRBC: 0 % (ref 0.0–0.2)

## 2018-04-21 LAB — HEPARIN LEVEL (UNFRACTIONATED): Heparin Unfractionated: 0.56 IU/mL (ref 0.30–0.70)

## 2018-04-21 LAB — PROTIME-INR
INR: 1.32
Prothrombin Time: 16.2 seconds — ABNORMAL HIGH (ref 11.4–15.2)

## 2018-04-21 LAB — PARATHYROID HORMONE, INTACT (NO CA): PTH: 128 pg/mL — ABNORMAL HIGH (ref 15–65)

## 2018-04-21 MED ORDER — DULOXETINE HCL 20 MG PO CPEP
20.0000 mg | ORAL_CAPSULE | Freq: Every day | ORAL | Status: DC
  Administered 2018-04-22 – 2018-05-01 (×9): 20 mg via ORAL
  Filled 2018-04-21 (×10): qty 1

## 2018-04-21 MED ORDER — ALPRAZOLAM 0.25 MG PO TABS
0.2500 mg | ORAL_TABLET | Freq: Every evening | ORAL | Status: DC | PRN
  Administered 2018-04-21 – 2018-04-30 (×9): 0.25 mg via ORAL
  Filled 2018-04-21 (×9): qty 1

## 2018-04-21 MED ORDER — SODIUM CHLORIDE 0.9 % IV SOLN
125.0000 mg | INTRAVENOUS | Status: DC
  Administered 2018-04-21: 125 mg via INTRAVENOUS
  Filled 2018-04-21 (×3): qty 10

## 2018-04-21 MED ORDER — DARBEPOETIN ALFA 60 MCG/0.3ML IJ SOSY
60.0000 ug | PREFILLED_SYRINGE | INTRAMUSCULAR | Status: DC
  Administered 2018-04-21: 60 ug via SUBCUTANEOUS
  Filled 2018-04-21 (×2): qty 0.3

## 2018-04-21 NOTE — Progress Notes (Addendum)
Advanced Heart Failure Rounding Note  PCP-Cardiologist: Jocelyn Champagne, MD   Subjective:    Remains on lasix 160 mg q6 hours. She voided 1.6 L yesterday. Weight down 5 lbs. Creatinine trending up 5.48 today. K 3.4.  Vein mapping done yesterday. Remains off coumadin and on heparin for perm HD access.  BP mildly improved with increased coreg. SBP 140-150s.   SOB is slightly better. Still has abdominal pain. CP is unchanged.   Objective:   Weight Range: 56.6 kg Body mass index is 18.41 kg/m.   Vital Signs:   Temp:  [98.4 F (36.9 C)-98.9 F (37.2 C)] 98.4 F (36.9 C) (10/23 0426) Pulse Rate:  [68-69] 69 (10/23 0426) Resp:  [17-18] 18 (10/23 0426) BP: (121-157)/(77-88) 157/88 (10/23 0426) SpO2:  [98 %-100 %] 100 % (10/23 0426) Weight:  [56.6 kg] 56.6 kg (10/23 0426) Last BM Date: 04/19/18  Weight change: Filed Weights   04/19/18 0516 04/20/18 0613 04/21/18 0426  Weight: 58.6 kg 58.7 kg 56.6 kg    Intake/Output:   Intake/Output Summary (Last 24 hours) at 04/21/2018 0903 Last data filed at 04/20/2018 2300 Gross per 24 hour  Intake 900.6 ml  Output 1450 ml  Net -549.4 ml      Physical Exam   General: Appears fatigued. Thin. No resp difficulty. HEENT: Normal Neck: Supple. JVP to jaw. Carotids 2+ bilat; no bruits. No thyromegaly or nodule noted. Cor: PMI nondisplaced. RRR, No M/G/R noted. HD cath in right groin.  Lungs: CTAB Abdomen: Soft, non-tender, non-distended, no HSM. No bruits or masses. +BS  Extremities: No cyanosis, clubbing, or rash. RLE 1+ ankle edema. LLE no edema.  Neuro: Alert & orientedx3, cranial nerves grossly intact. moves all 4 extremities w/o difficulty. Affect pleasant   Telemetry   BIV paced with underlying NSR 70s. Personally reviewed.   EKG    No new tracings.   Labs    CBC Recent Labs    04/20/18 0426 04/21/18 0408  WBC 8.2 7.5  HGB 9.3* 9.0*  HCT 28.7* 27.6*  MCV 74.5* 74.6*  PLT 298 096   Basic Metabolic  Panel Recent Labs    04/20/18 0426 04/21/18 0408  NA 137 137  K 3.5 3.4*  CL 102 100  CO2 26 26  GLUCOSE 106* 100*  BUN 32* 37*  CREATININE 4.94* 5.48*  CALCIUM 9.8 9.8  PHOS 4.4 5.0*   Liver Function Tests Recent Labs    04/20/18 0426 04/21/18 0408  ALBUMIN 3.2* 3.1*   No results for input(s): LIPASE, AMYLASE in the last 72 hours. Cardiac Enzymes No results for input(s): CKTOTAL, CKMB, CKMBINDEX, TROPONINI in the last 72 hours.  BNP: BNP (last 3 results) Recent Labs    02/17/18 0353 03/11/18 1458 04/16/18 1805  BNP 675.7* 1,452.6* 2,442.0*    ProBNP (last 3 results) No results for input(s): PROBNP in the last 8760 hours.   D-Dimer No results for input(s): DDIMER in the last 72 hours. Hemoglobin A1C No results for input(s): HGBA1C in the last 72 hours. Fasting Lipid Panel No results for input(s): CHOL, HDL, LDLCALC, TRIG, CHOLHDL, LDLDIRECT in the last 72 hours. Thyroid Function Tests No results for input(s): TSH, T4TOTAL, T3FREE, THYROIDAB in the last 72 hours.  Invalid input(s): FREET3  Other results:   Imaging    No results found.   Medications:     Scheduled Medications: . amLODipine  10 mg Oral Daily  . aspirin EC  81 mg Oral Daily  . atorvastatin  80 mg  Oral Daily  . B-complex with vitamin C  1 tablet Oral Daily  . buPROPion  100 mg Oral TID  . busPIRone  15 mg Oral BID  . carvedilol  25 mg Oral BID  . Chlorhexidine Gluconate Cloth  6 each Topical Q0600  . Chlorhexidine Gluconate Cloth  6 each Topical Q0600  . cholecalciferol  1,000 Units Oral Daily  . doxazosin  8 mg Oral Daily   And  . doxazosin  4 mg Oral QHS  . DULoxetine  30 mg Oral Daily  . hydrALAZINE  100 mg Oral Q8H  . isosorbide mononitrate  120 mg Oral Daily  . levothyroxine  112 mcg Oral QAC breakfast  . montelukast  10 mg Oral Daily  . pantoprazole  40 mg Oral BID AC  . traZODone  25 mg Oral QHS    Infusions: . ferric gluconate (FERRLECIT/NULECIT) IV    .  furosemide 160 mg (04/21/18 0606)  . heparin 1,150 Units/hr (04/20/18 2035)    PRN Medications: acetaminophen, albuterol, ALPRAZolam, ondansetron (ZOFRAN) IV, oxyCODONE    Patient Profile   Jocelyn Hill a67 y.o.femalewith h/o mitral regurgitation s/p bioprosthetic MVR 11/7339, chronic diastolic CHF, CAD s/p CABG with LIMA-LAD and SVG-LCx 01/2018, long standing HTN, DM2, CKD IV-V, h/o Stroke, anxiety, depression, GERD. Admitted in 8/19 for Bioprosthetic MVR, Maze, LA appendage clip, PFO closure, and CABG with LIMA-LAD and SVG-LCx. This was complicated by AKI and complete heart block. St Jude CRT-P device placed. Echo in 8/19 post-op showed EF 45-50%, stable bioprosthetic mitral valve.   She was sent to ED 04/16/18 with A/C HF and creatinine 6.0.   Assessment/Plan    1. Acute on chronic combined CHF: Echo (8/19, post-op) with EF 45-50%. Has St Jude CRT-P device due to post-op complete heart block.  - Echo this admission with lower EF 30-35%. - Remains volume overloaded, but slightly improved.  - Remains on IV lasix 160 mg q6 hours with modest UOP. Diuretics per renal. Creatinine up to 5.48. - Continue coreg 25 mg BID - Continue hydralazine 100 mg q8 and imdur 120 mg daily - No spiro with CKD IV - No ACE with hx of angioedema/CKD IV 2. CKD Stage IV: Had R hydronephrosis in setting of hydronephrosis and required percutaneous drain in 9/18 (removed). Renal US 04/14/17 showed no further evidence of hydronephrosis. - Creatinine peaked at 6.17.  - Had HD with 2 L off 10/20. Lasix increased yesterday with modest UOP. Creatinine trending up 5.48 - Coumadin on hold. Remains on heparin for tunneled HD cath +/- AVF. Vein mapping completed yesterday.  3. PFX:TKWIOXB elevated with SBP 140-150s.   - Continue hydralazine 100 mg tid.  - Continue amlodipine 10 mg daily.  -Continue coreg 25 mg BID.  - Continue doxazosin 8 qam/4 qpm. 4. 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  Had bioprosthetic MVR in 8/19. Post-op echo in 8/19 showed stable bioprosthetic mitral valve. Echo this admission showed mean gradient 5 mm Hg. No change.  5. Atrial fibrillation: Paroxysmal. Now s/p Maze in 8/19. Remains in NSR. - Coumadin held. She is on heparin drip now for perm HD cath placement (has HD cath in groin) 6. CAD: LHC in 1/19 showed severe ostial LCx disease and moderate LAD disease. She had LIMA-LAD and SVG-OM in 8/19.  - She has chronic atypical CP. Continues to have CP on and off, lasts about 30 minutes. Unrelated to activity. Troponin trend flat. EKG unchanged. Not thought to be ischemic.  - Continue ASA 81 and atorvastatin  80 mg daily.Good lipids 03/2018 -ContinueImdur 120 mg daily. Continue Coreg 25 mg BID 7. Disposition - PT/OT recommending SNF  Medication concerns reviewed with patient and pharmacy team. Barriers identified: none  Length of Stay: [place], NP  04/21/2018, 9:03 AM  Advanced Heart Failure Team Pager [phone] (M-F; 7a - 4p)  Please contact [facility] for night-coverage after hours (4p -7a ) and weekends on [email or web address]  Patient seen with NP, agree with the above note.  She continues on high dose diuretics with some UOP.  Creatinine rising though BUN not markedly high.  BP better controlled.   Will watch over the next couple of days to determine need for chronic HD, per renal.   Jocelyn Hill 04/21/2018 12:22 PM

## 2018-04-21 NOTE — Consult Note (Addendum)
Hospital Consult    Reason for Consult:  Permanent access Requesting Physician:  Dr. Moshe Cipro MRN #:  [id number]  History of Present Illness: This is a 67 y.o. female with PMH significant for HTN, CAD, CHF with CABG 01/2018 by Dr. Ricard Dillon.  She was felt to be uremic on admission to hospital.  Temporary catheter was placed in groin and patient had HD treatment 04/18/18.  Kidney function improved and patient has been making good urine, thus temp cath was removed.  She is seen in consultation for evaluation for permanent dialysis access.  She is R arm dominant and would prefer placement in L arm.  She is on IV heparin, normally warfarin at [facility] for PAF.  Vein mapping has been performed.  Past Medical History:  Diagnosis Date  . Anemia    low iron  . Anxiety   . Arthritis   . Bronchitis   . CHF (congestive heart failure) ([place])   . CKD (chronic kidney disease) stage 4, GFR 15-29 ml/min (HCC) 03/16/2017  . Coronary artery disease   . Depression   . Diabetes mellitus without complication ([place])   . Diet-controlled diabetes mellitus ([place])   . Ganglion cyst    left wrist  . GERD (gastroesophageal reflux disease)   . Headache    chronic headaches  . Heart failure ([place])   . Hyperlipidemia   . Hypertension   . Hypothyroidism   . Mitral regurgitation   . Myocardial infarction ([place])    3x last one 2008  . PAF (paroxysmal atrial fibrillation) ([place])   . Pneumonia    x 2  . S/P Maze operation for atrial fibrillation 02/18/2018   Complete bilateral atrial lesion set using bipolar radiofrequency and cryothermy ablation with clipping of LA appendage  . S/P mitral valve replacement with bioprosthetic valve 02/18/2018   [facility] Mitral stented bovine pericardial tissue valve Model 7300 TFX Serial # [id number] Size 29  . Stroke [facility])    no lasting residual - ? 2014  . Umbilical hernia     Past Surgical History:  Procedure Laterality Date  . ABDOMINAL HYSTERECTOMY     PARTIALS  . BIV  PACEMAKER INSERTION CRT-P N/A 02/26/2018   Procedure: BIV PACEMAKER INSERTION CRT-P;  Surgeon: Constance Haw, MD;  Location: [facility];  Service: Cardiovascular;  Laterality: N/A;  . CARDIAC CATHETERIZATION    . CESAREAN SECTION    . CORONARY ARTERY BYPASS GRAFT N/A 02/18/2018   Procedure: CORONARY ARTERY BYPASS GRAFTING (CABG) WITH IMA. ENDOSCOPIC VEIN HARVEST. IMA TO LAD, SVG TO CIRC.;  Surgeon: Rexene Alberts, MD;  Location: [place];  Service: Open Heart Surgery;  Laterality: N/A;  . INSERTION OF DIALYSIS CATHETER N/A 02/18/2018   Procedure: PLACEMENT OF TEMPORARY HD CATHETER, POWER TRIALYSIS 13FR 20CM;  Surgeon: Rexene Alberts, MD;  Location: [place];  Service: Vascular;  Laterality: N/A;  . IR NEPHRO TUBE REMOV/FL  04/02/2017  . IR NEPHROSTOGRAM RIGHT THRU EXISTING ACCESS  04/02/2017  . IR NEPHROSTOMY PLACEMENT RIGHT  03/27/2017  . IR THORACENTESIS ASP PLEURAL SPACE W/IMG GUIDE  03/24/2017  . MAZE N/A 02/18/2018   Procedure: MAZE;  Surgeon: Rexene Alberts, MD;  Location: [place];  Service: Open Heart Surgery;  Laterality: N/A;  . MITRAL VALVE REPLACEMENT N/A 02/18/2018   Procedure: MITRAL VALVE (MV) REPLACEMENT;  Surgeon: Rexene Alberts, MD;  Location: [place];  Service: Open Heart Surgery;  Laterality: N/A;  . MULTIPLE EXTRACTIONS WITH ALVEOLOPLASTY N/A 11/05/2017  Procedure: Extraction of tooth #'s 4,5,7,8,9,10,12,14 and 29 with alveoloplasty and gross debridement of remaining teeth;  Surgeon: [name], DDS;  Location: [place];  Service: Oral Surgery;  Laterality: N/A;  . PATENT FORAMEN OVALE(PFO) CLOSURE N/A 02/18/2018   Procedure: PATENT FORAMEN OVALE (PFO) CLOSURE;  Surgeon: Rexene Alberts, MD;  Location: [place];  Service: Open Heart Surgery;  Laterality: N/A;  . RIGHT/LEFT HEART CATH AND CORONARY ANGIOGRAPHY N/A 07/03/2017   Procedure: RIGHT/LEFT HEART CATH AND CORONARY ANGIOGRAPHY;  Surgeon: Larey Dresser, MD;  Location: [place] [facility];  Service: Cardiovascular;   Laterality: N/A;  . TEE WITHOUT CARDIOVERSION N/A 10/08/2017   Procedure: TRANSESOPHAGEAL ECHOCARDIOGRAM (TEE);  Surgeon: Larey Dresser, MD;  Location: [facility];  Service: Cardiovascular;  Laterality: N/A;  . TEE WITHOUT CARDIOVERSION N/A 02/18/2018   Procedure: TRANSESOPHAGEAL ECHOCARDIOGRAM (TEE);  Surgeon: Rexene Alberts, MD;  Location: [place];  Service: Open Heart Surgery;  Laterality: N/A;  . TONSILLECTOMY    . TUBAL LIGATION      Allergies  Allergen Reactions  . Ace Inhibitors Anaphylaxis and Swelling  . Motrin Ib [Ibuprofen] Anaphylaxis    Prior to Admission medications   Medication Sig Start Date End Date Taking? Authorizing Provider  acetaminophen (TYLENOL) 325 MG tablet Take 2 tablets (650 mg total) by mouth every 6 (six) hours as needed for mild pain, fever or headache. 04/23/17  Yes Allie Bossier, MD  albuterol (PROVENTIL HFA;VENTOLIN HFA) 108 (90 Base) MCG/ACT inhaler Inhale 2 puffs into the lungs every 6 (six) hours as needed for wheezing or shortness of breath.   Yes [provider]  ALPRAZolam (XANAX) 0.25 MG tablet Take 1 tablet (0.25 mg total) by mouth at bedtime as needed for anxiety. 04/16/18  Yes Georgiana Shore, NP  amLODipine (NORVASC) 10 MG tablet Take 1 tablet (10 mg total) by mouth daily. 04/24/17  Yes Allie Bossier, MD  aspirin EC 81 MG EC tablet Take 1 tablet (81 mg total) by mouth daily. 04/24/17  Yes Allie Bossier, MD  atorvastatin (LIPITOR) 80 MG tablet Take 1 tablet (80 mg total) by mouth daily. 03/06/18  Yes Barrett, Erin R, PA-C  b complex vitamins tablet Take 1 tablet by mouth daily.   Yes [provider]  buPROPion (WELLBUTRIN) 100 MG tablet Take 100 mg by mouth 3 (three) times daily.   Yes [provider]  busPIRone (BUSPAR) 15 MG tablet Take 1 tablet (15 mg total) by mouth 2 (two) times daily. 04/23/17  Yes Allie Bossier, MD  carvedilol (COREG) 6.25 MG tablet Take 6.25 mg by mouth 2 (two) times daily.   Yes  [provider]  cholecalciferol (VITAMIN D) 1000 units tablet Take 1,000 Units by mouth daily.   Yes [provider]  doxazosin (CARDURA) 8 MG tablet Take 8 mg (1 tab) in am and 4 mg (1/2 tab) in pm Patient taking differently: Take 4-8 mg by mouth See admin instructions. Take 1 tablet every morning and take 1/2 tablet every evening 03/11/18  Yes Clegg, Amy D, NP  DULoxetine (CYMBALTA) 30 MG capsule Take 1 capsule (30 mg total) by mouth daily. 04/24/17  Yes Allie Bossier, MD  gabapentin (NEURONTIN) 300 MG capsule Take 300 mg by mouth at bedtime. 10/02/17  Yes [provider]  hydrALAZINE (APRESOLINE) 100 MG tablet Take 1 tablet (100 mg total) by mouth every 8 (eight) hours. 03/05/18  Yes Barrett, Erin R, PA-C  isosorbide mononitrate (IMDUR) 120 MG  24 hr tablet Take 1 tablet (120 mg total) by mouth daily. 04/24/17  Yes Allie Bossier, MD  levothyroxine (SYNTHROID, LEVOTHROID) 112 MCG tablet Take 112 mcg by mouth daily before breakfast.   Yes [provider]  montelukast (SINGULAIR) 10 MG tablet Take 1 tablet (10 mg total) by mouth daily. 04/24/17  Yes Allie Bossier, MD  Nutritional Supplements (CARNATION BREAKFAST ESSENTIALS PO) Take 237 mLs by mouth 2 (two) times daily.   Yes [provider]  oxyCODONE (OXY IR/ROXICODONE) 5 MG immediate release tablet Take 1 tablet (5 mg total) by mouth every 4 (four) hours as needed for severe pain. 03/05/18  Yes Barrett, Erin R, PA-C  pantoprazole (PROTONIX) 40 MG tablet Take 1 tablet (40 mg total) by mouth 2 (two) times daily before a meal. 04/23/17  Yes Allie Bossier, MD  potassium chloride SA (K-DUR,KLOR-CON) 20 MEQ tablet Take 20 mEq by mouth daily.   Yes [provider]  torsemide (DEMADEX) 20 MG tablet Take 100 mg in am and 60 mg in pm Patient taking differently: Take 60-100 mg by mouth See admin instructions. Take 5 tablets every morning then take 3 tablets every evening 04/02/18  Yes Larey Dresser, MD    traZODone (DESYREL) 25 mg TABS tablet Take 25 mg by mouth at bedtime.   Yes [provider]  warfarin (COUMADIN) 2.5 MG tablet Take 1 tablet (2.5 mg total) by mouth daily at 6 PM. 03/05/18  Yes Barrett, Erin R, PA-C  metolazone (ZAROXOLYN) 2.5 MG tablet Take 1 tablet (2.5 mg total) by mouth 2 (two) times a week. 04/19/18 07/18/18  Georgiana Shore, NP  ondansetron (ZOFRAN) 4 MG tablet Take 1 tablet (4 mg total) by mouth every 8 (eight) hours as needed for nausea or vomiting. Patient not taking: Reported on 04/17/2018 04/23/17 04/23/18  Allie Bossier, MD    Social History   Socioeconomic History  . Marital status: Divorced    Spouse name: Not on file  . Number of children: Not on file  . Years of education: Not on file  . Highest education level: Not on file  Occupational History  . Not on file  Social Needs  . Financial resource strain: Not on file  . Food insecurity:    Worry: Not on file    Inability: Not on file  . Transportation needs:    Medical: Not on file    Non-medical: Not on file  Tobacco Use  . Smoking status: Former Smoker    Types: Cigarettes  . Smokeless tobacco: Never Used  Substance and Sexual Activity  . Alcohol use: No  . Drug use: No    Comment: marijuana in the past  . Sexual activity: Not Currently  Lifestyle  . Physical activity:    Days per week: Not on file    Minutes per session: Not on file  . Stress: Not on file  Relationships  . Social connections:    Talks on phone: Not on file    Gets together: Not on file    Attends religious service: Not on file    Active member of club or organization: Not on file    Attends meetings of clubs or organizations: Not on file    Relationship status: Not on file  . Intimate partner violence:    Fear of current or ex partner: Not on file    Emotionally abused: Not on file    Physically abused: Not on file  Forced sexual activity: Not on file  Other Topics Concern  . Not on file  Social  History Narrative  . Not on file     Family History  Problem Relation Age of Onset  . Hypertension Mother     ROS: Otherwise negative unless mentioned in HPI  Physical Examination  Vitals:   04/20/18 2121 04/21/18 0426  BP: (!) 146/82 (!) 157/88  Pulse:  69  Resp:  18  Temp:  98.4 F (36.9 C)  SpO2:  100%   Body mass index is 18.41 kg/m.  General:  WDWN in NAD Gait: Not observed HENT: WNL, normocephalic Pulmonary: normal non-labored breathing Cardiac: regular with ectopy Abdomen:  soft, NT/ND, no masses Skin: without rashes Vascular Exam/Pulses: symmetrical radial pulses; feet symmetrically warm to touch with good refill Extremities: without ischemic changes, without Gangrene , without cellulitis; without open wounds Musculoskeletal: no muscle wasting or atrophy  Neurologic: A&O X 3;  No focal weakness or paresthesias are detected; speech is fluent/normal; some R hand numbness Psychiatric:  The pt has Normal affect. Lymph:  Unremarkable  CBC    Component Value Date/Time   WBC 7.5 04/21/2018 0408   RBC 3.70 (L) 04/21/2018 0408   HGB 9.0 (L) 04/21/2018 0408   HCT 27.6 (L) 04/21/2018 0408   PLT 279 04/21/2018 0408   MCV 74.6 (L) 04/21/2018 0408   MCH 24.3 (L) 04/21/2018 0408   MCHC 32.6 04/21/2018 0408   RDW 14.5 04/21/2018 0408   LYMPHSABS 0.5 (L) 03/03/2018 0415   MONOABS 0.6 03/03/2018 0415   EOSABS 0.7 03/03/2018 0415   BASOSABS 0.1 03/03/2018 0415    BMET    Component Value Date/Time   NA 137 04/21/2018 0408   K 3.4 (L) 04/21/2018 0408   CL 100 04/21/2018 0408   CO2 26 04/21/2018 0408   GLUCOSE 100 (H) 04/21/2018 0408   BUN 37 (H) 04/21/2018 0408   CREATININE 5.48 (H) 04/21/2018 0408   CREATININE 3.63 (H) 01/11/2018 1148   CALCIUM 9.8 04/21/2018 0408   CALCIUM 9.7 04/21/2017 0231   GFRNONAA 7 (L) 04/21/2018 0408   GFRAA 9 (L) 04/21/2018 0408    COAGS: Lab Results  Component Value Date   INR 1.32 04/21/2018   INR 1.37 04/20/2018   INR  1.37 04/19/2018     Non-Invasive Vascular Imaging:   Bilateral upper extremity vein mapping completed.   ASSESSMENT/PLAN: This is a 67 y.o. female with progressing CKD  Vein map performed; L basilic is only marginal for conduit use Continue to restrict L arm for now Plan will be for L arm AV fistula versus graft Nephrology will notify us if [facility] is also required at time of access surgery Dr. Donnetta Hutching will evaluate the patient later today and provide further recommendations  Dagoberto Ligas PA-C [facility] [phone]  I have examined the patient, reviewed and agree with above.  Discussed options regarding the patient's hemodialysis needs.  Fortunately she is currently making urine and her temporary catheter is been removed.  I discussed tunneled catheter, AV fistula and AV graft.  By physical exam and by vein map she has a very small surface veins.  She has marginal right upper arm basilic vein.  I discussed options of left arm graft versus attempted fistula on the right.  I think that she would be best served with a left arm graft as her initial dialysis.  We will plan this for Friday if nephrology feels it is appropriate from a timing standpoint.  Will not plan catheter and less felt as needed by nephrology  Curt Jews, MD 04/21/2018 8:38 PM

## 2018-04-21 NOTE — Progress Notes (Signed)
Physical Therapy Treatment Patient Details Name: Jocelyn Hill MRN: [id number] DOB: 29-May-1951 Today's Date: 04/21/2018    History of Present Illness Pt is a 67 y.o. female admitted 04/16/18 with chest pain, leg edema and SOB. Worked up for CHF and elevated creatinine. Temporary femoral HD cath removed 10/22. 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   PT Comments    Pt slowly progressing with mobility. Able to amb short distance with RW and minA to prevent LOB; required seated rest break with minimal activity. Remains limited by fatigue, decreased activity tolerance and generalized weakness. Min guard for performing standing ADLs at sink due to poor balance strategies/postural reactions. Continue to recommend SNF-level therapies. Will follow acutely.    Follow Up Recommendations  SNF;Supervision for mobility/OOB     Equipment Recommendations  Rolling walker with 5" wheels;3in1 (PT)    Recommendations for Other Services       Precautions / Restrictions Precautions Precautions: Fall Restrictions Weight Bearing Restrictions: No    Mobility  Bed Mobility Overal bed mobility: Needs Assistance Bed Mobility: Supine to Sit     Supine to sit: Supervision;HOB elevated        Transfers Overall transfer level: Needs assistance Equipment used: Rolling walker (2 wheeled) Transfers: Sit to/from Stand Sit to Stand: Min assist         General transfer comment: MinA to steady RW; heavy reliance of UE support on RW  Ambulation/Gait Ambulation/Gait assistance: Min assist Gait Distance (Feet): 20 Feet Assistive device: Rolling walker (2 wheeled) Gait Pattern/deviations: Step-through pattern;Decreased stride length;Leaning posteriorly;Trunk flexed Gait velocity: Decreased Gait velocity interpretation: <1.31 ft/sec, indicative of household ambulator General Gait Details: Slow, fatigued amb with RW and close min guard for balance; requiring 1x  minA to prevent posterior LOB. Required seated rest break while performing ADLs at sink prior to walking back to bed   Stairs             Wheelchair Mobility    Modified Rankin (Stroke Patients Only)       Balance Overall balance assessment: Needs assistance   Sitting balance-Leahy Scale: Good     Standing balance support: Single extremity supported;During functional activity Standing balance-Leahy Scale: Poor Standing balance comment: Reliant on at least single UE support to maintain dynamic standing balance while performing ADLs at sink                            Cognition Arousal/Alertness: Awake/alert Behavior During Therapy: Flat affect Overall Cognitive Status: Within Functional Limits for tasks assessed                                 General Comments: Slowed responses, seems fatigue-related      Exercises      General Comments General comments (skin integrity, edema, etc.): SpO2 92% on RA with activity      Pertinent Vitals/Pain Pain Assessment: Faces Faces Pain Scale: Hurts little more Pain Location: stomach Pain Descriptors / Indicators: Constant Pain Intervention(s): Monitored during session;Limited activity within patient's tolerance    Home Living                      Prior Function            PT Goals (current goals can now be found in the care plan section) Acute Rehab PT Goals Patient Stated Goal:  to go back to rehab PT Goal Formulation: With patient Time For Goal Achievement: 05/02/18 Potential to Achieve Goals: Good Progress towards PT goals: Progressing toward goals    Frequency    Min 2X/week      PT Plan Current plan remains appropriate    Co-evaluation              AM-PAC PT "6 Clicks" Daily Activity  Outcome Measure  Difficulty turning over in bed (including adjusting bedclothes, sheets and blankets)?: A Little Difficulty moving from lying on back to sitting on the side of  the bed? : A Little Difficulty sitting down on and standing up from a chair with arms (e.g., wheelchair, bedside commode, etc,.)?: Unable Help needed moving to and from a bed to chair (including a wheelchair)?: A Little Help needed walking in hospital room?: A Little Help needed climbing 3-5 steps with a railing? : A Lot 6 Click Score: 15    End of Session Equipment Utilized During Treatment: Gait belt Activity Tolerance: Patient limited by fatigue Patient left: in bed;with call bell/phone within reach;with bed alarm set Nurse Communication: Mobility status PT Visit Diagnosis: Other abnormalities of gait and mobility (R26.89);Muscle weakness (generalized) (M62.81)     Time: 1003-4961 PT Time Calculation (min) (ACUTE ONLY): 16 min  Charges:  $Therapeutic Activity: 8-22 mins                     Mabeline Caras, PT, DPT Acute Rehabilitation Services  Pager [phone] Office [place] 04/21/2018, 8:44 AM

## 2018-04-21 NOTE — Progress Notes (Signed)
[place] for heparin Indication: atrial fibrillation  Allergies  Allergen Reactions  . Ace Inhibitors Anaphylaxis and Swelling  . Motrin Ib [Ibuprofen] Anaphylaxis    Patient Measurements: Height: 5\' 9"  (175.3 cm) Weight: 124 lb 11.2 oz (56.6 kg) IBW/kg (Calculated) : 66.2 Heparin Dosing Weight: 65.2 kg   Vital Signs: Temp: 98.4 F (36.9 C) (10/23 0426) Temp Source: Oral (10/23 0426) BP: 157/88 (10/23 0426) Pulse Rate: 69 (10/23 0426)  Labs: Recent Labs    04/19/18 0651  04/19/18 1422 04/20/18 0426 04/20/18 1826 04/21/18 0408  HGB 9.7*  --   --  9.3*  --  9.0*  HCT 31.1*  --   --  28.7*  --  27.6*  PLT 292  --   --  298  --  279  LABPROT 16.7*  --   --  16.7*  --  16.2*  INR 1.37  --   --  1.37  --  1.32  HEPARINUNFRC  --    < >  --  0.31 0.44 0.56  CREATININE 4.12*  --  4.29* 4.94*  --  5.48*   < > = values in this interval not displayed.    Estimated Creatinine Clearance: 9 mL/min (A) (by C-G formula based on SCr of 5.48 mg/dL (H)).   Medical History: Past Medical History:  Diagnosis Date  . Anemia    low iron  . Anxiety   . Arthritis   . Bronchitis   . CHF (congestive heart failure) ([place])   . CKD (chronic kidney disease) stage 4, GFR 15-29 ml/min (HCC) 03/16/2017  . Coronary artery disease   . Depression   . Diabetes mellitus without complication ([place])   . Diet-controlled diabetes mellitus ([place])   . Ganglion cyst    left wrist  . GERD (gastroesophageal reflux disease)   . Headache    chronic headaches  . Heart failure ([place])   . Hyperlipidemia   . Hypertension   . Hypothyroidism   . Mitral regurgitation   . Myocardial infarction ([place])    3x last one 2008  . PAF (paroxysmal atrial fibrillation) ([place])   . Pneumonia    x 2  . S/P Maze operation for atrial fibrillation 02/18/2018   Complete bilateral atrial lesion set using bipolar radiofrequency and cryothermy ablation with clipping of LA appendage  . S/P  mitral valve replacement with bioprosthetic valve 02/18/2018   [facility] Mitral stented bovine pericardial tissue valve Model 7300 TFX Serial # [id number] Size 29  . Stroke [facility])    no lasting residual - ? 2014  . Umbilical hernia     Medications:  Scheduled:  . amLODipine  10 mg Oral Daily  . aspirin EC  81 mg Oral Daily  . atorvastatin  80 mg Oral Daily  . B-complex with vitamin C  1 tablet Oral Daily  . buPROPion  100 mg Oral TID  . busPIRone  15 mg Oral BID  . carvedilol  25 mg Oral BID  . Chlorhexidine Gluconate Cloth  6 each Topical Q0600  . Chlorhexidine Gluconate Cloth  6 each Topical Q0600  . cholecalciferol  1,000 Units Oral Daily  . darbepoetin (ARANESP) injection - NON-DIALYSIS  60 mcg Subcutaneous Q Wed-1800  . doxazosin  8 mg Oral Daily   And  . doxazosin  4 mg Oral QHS  . DULoxetine  30 mg Oral Daily  . hydrALAZINE  100 mg Oral Q8H  . isosorbide mononitrate  120 mg Oral  Daily  . levothyroxine  112 mcg Oral QAC breakfast  . montelukast  10 mg Oral Daily  . pantoprazole  40 mg Oral BID AC  . traZODone  25 mg Oral QHS    Assessment: 51 yoF on warfarin PTA for Afib - warfarin now on hold  planning to undergo procedures. Pt reported home regimen as 2.5 mg daily (last dose 10/17 PM).   INR remains subtherapeutic at 1.3.  Heparin level remains therapeutic at 0.56 on heparin 1150 units/hr. No signs/symptoms of bleeding or issues with infusion reported. CBC low stable  Goal of Therapy:  INR 2-3 Heparin level 0.3-0.7 units/ml Monitor platelets by anticoagulation protocol: Yes   Plan:  Continue heparin infusion  1150 units/hr Hold warfarin until HD access plans confirmed Monitor daily HL, CBC, and for s/sx of bleeding   Bonnita Nasuti Pharm.D. CPP, BCPS Clinical Pharmacist [phone] 04/21/2018 11:06 AM

## 2018-04-21 NOTE — Progress Notes (Signed)
Subjective:   had 1650 of UOP on high dose lasix.  She says she feels better.   Kidney numbers did worsen from yesterday but BUN only 37- not usually to the level where pts have uremic sxms  - BP is better  Objective Vital signs in last 24 hours: Vitals:   04/20/18 1445 04/20/18 1934 04/20/18 2121 04/21/18 0426  BP: 127/78 133/79 (!) 146/82 (!) 157/88  Pulse: 68 69  69  Resp: 17   18  Temp: 98.9 F (37.2 C) 98.5 F (36.9 C)  98.4 F (36.9 C)  TempSrc:    Oral  SpO2: 98% 100%  100%  Weight:    56.6 kg  Height:       Weight change: -2.132 kg  Intake/Output Summary (Last 24 hours) at 04/21/2018 1014 Last data filed at 04/21/2018 9326 Gross per 24 hour  Intake 1020.6 ml  Output 1450 ml  Net -429.4 ml    Assessment/ Plan: Pt is a 67 y.o. yo female with advanced CKD at baseline who was admitted on 04/16/2018 with what appeared to be uremic symptoms with BUN in the high 50's  Assessment/Plan: 1. Renal- felt to be uremic but BUN only 50's- s/p temp cath and first HD on 10/20- was planning for another treatment 10/21 however,   making good urine and BUN only 26- I decided to observe, numbers slightly worse today but still not to a level where I would think completely explain sxms and clinically is improved. Cont to hold on HD today - have removed femoral cath.  IF needs HD again this admit will [facility]. Also will get vein map and consult [facility]VS for AVF this admit - discussed with pt  2. Anemia- hgb trending up without transfusion- 9.0 today - on iron, will add ESA   3. Secondary hyperparathyroidism- has been on nutritional vit D only - phos OK , pth 128 4. HTN/volume- BP high- as OP on amlodipine, coreg, cardura, hydralazine and high dose diuretics - to be continued -is better and pt feels better as well 5. Dispo- give me another 48 hours to determine if we will need to start chronic hemo this admit   [place]: Basic Metabolic Panel: Recent Labs  Lab  04/19/18 1422 04/20/18 0426 04/21/18 0408  NA 138 137 137  K 3.8 3.5 3.4*  CL 103 102 100  CO2 25 26 26   GLUCOSE 89 106* 100*  BUN 28* 32* 37*  CREATININE 4.29* 4.94* 5.48*  CALCIUM 9.4 9.8 9.8  PHOS  --  4.4 5.0*   Liver Function Tests: Recent Labs  Lab 04/16/18 1805 04/20/18 0426 04/21/18 0408  AST 16  --   --   ALT 15  --   --   ALKPHOS 76  --   --   BILITOT 0.5  --   --   PROT 6.3*  --   --   ALBUMIN 3.3* 3.2* 3.1*   No results for input(s): LIPASE, AMYLASE in the last 168 hours. No results for input(s): AMMONIA in the last 168 hours. CBC: Recent Labs  Lab 04/16/18 1805 04/18/18 0229 04/19/18 0651 04/20/18 0426 04/21/18 0408  WBC 6.9 7.5 7.1 8.2 7.5  HGB 8.7* 9.0* 9.7* 9.3* 9.0*  HCT 26.9* 27.3* 31.1* 28.7* 27.6*  MCV 76.2* 74.0* 75.3* 74.5* 74.6*  PLT 281 292 292 298 279   Cardiac Enzymes: Recent Labs  Lab 04/16/18 1805 04/16/18 2335 04/17/18 0320 04/17/18 0942 04/17/18 1416  TROPONINI 0.07* 0.07* 0.07* 0.07* 0.06*   CBG: Recent Labs  Lab 04/19/18 1412  GLUCAP 97    Iron Studies:  No results for input(s): IRON, TIBC, TRANSFERRIN, FERRITIN in the last 72 hours. Studies/Results: No results found. Medications: Infusions: . ferric gluconate (FERRLECIT/NULECIT) IV    . furosemide 160 mg (04/21/18 0606)  . heparin 1,150 Units/hr (04/20/18 2035)    Scheduled Medications: . amLODipine  10 mg Oral Daily  . aspirin EC  81 mg Oral Daily  . atorvastatin  80 mg Oral Daily  . B-complex with vitamin C  1 tablet Oral Daily  . buPROPion  100 mg Oral TID  . busPIRone  15 mg Oral BID  . carvedilol  25 mg Oral BID  . Chlorhexidine Gluconate Cloth  6 each Topical Q0600  . Chlorhexidine Gluconate Cloth  6 each Topical Q0600  . cholecalciferol  1,000 Units Oral Daily  . doxazosin  8 mg Oral Daily   And  . doxazosin  4 mg Oral QHS  . DULoxetine  30 mg Oral Daily  . hydrALAZINE  100 mg Oral Q8H  . isosorbide mononitrate  120 mg Oral Daily  .  levothyroxine  112 mcg Oral QAC breakfast  . montelukast  10 mg Oral Daily  . pantoprazole  40 mg Oral BID AC  . traZODone  25 mg Oral QHS    have reviewed scheduled and prn medications.  Physical Exam: General: flat, - difficult to read- does not appear uremic Heart: RRR Lungs: mostly clear- dec BS at extreme bases Abdomen: distended but better Extremities:  Really no periph edema Dialysis Access: none   04/21/2018,10:14 AM  LOS: 4 days

## 2018-04-21 NOTE — Progress Notes (Signed)
CSW sent updated therapy notes to [facility], as the facility is working on insurance authorization for patient to return. CSW will follow for patient's medical readiness and support with discharge planning.  Estanislado Emms, [place]

## 2018-04-21 NOTE — Care Management Important Message (Signed)
Important Message  Patient Details  Name: Jocelyn Hill MRN: [id number] Date of Birth: 1951-03-07   Medicare Important Message Given:  Yes    [name] Katey Barrie 04/21/2018, 12:52 PM

## 2018-04-22 DIAGNOSIS — D638 Anemia in other chronic diseases classified elsewhere: Secondary | ICD-10-CM

## 2018-04-22 DIAGNOSIS — Z952 Presence of prosthetic heart valve: Secondary | ICD-10-CM

## 2018-04-22 DIAGNOSIS — Z7901 Long term (current) use of anticoagulants: Secondary | ICD-10-CM

## 2018-04-22 DIAGNOSIS — Z5181 Encounter for therapeutic drug level monitoring: Secondary | ICD-10-CM

## 2018-04-22 DIAGNOSIS — I480 Paroxysmal atrial fibrillation: Secondary | ICD-10-CM

## 2018-04-22 LAB — RENAL FUNCTION PANEL
Albumin: 3.3 g/dL — ABNORMAL LOW (ref 3.5–5.0)
Anion gap: 12 (ref 5–15)
BUN: 40 mg/dL — ABNORMAL HIGH (ref 8–23)
CO2: 25 mmol/L (ref 22–32)
Calcium: 10 mg/dL (ref 8.9–10.3)
Chloride: 100 mmol/L (ref 98–111)
Creatinine, Ser: 5.74 mg/dL — ABNORMAL HIGH (ref 0.44–1.00)
GFR calc Af Amer: 8 mL/min — ABNORMAL LOW (ref >60)
GFR calc non Af Amer: 7 mL/min — ABNORMAL LOW (ref >60)
Glucose, Bld: 106 mg/dL — ABNORMAL HIGH (ref 70–99)
Phosphorus: 5 mg/dL — ABNORMAL HIGH (ref 2.5–4.6)
Potassium: 3.4 mmol/L — ABNORMAL LOW (ref 3.5–5.1)
Sodium: 137 mmol/L (ref 135–145)

## 2018-04-22 LAB — CBC
HCT: 29.1 % — ABNORMAL LOW (ref 36.0–46.0)
Hemoglobin: 9.1 g/dL — ABNORMAL LOW (ref 12.0–15.0)
MCH: 23.7 pg — ABNORMAL LOW (ref 26.0–34.0)
MCHC: 31.3 g/dL (ref 30.0–36.0)
MCV: 75.8 fL — ABNORMAL LOW (ref 80.0–100.0)
Platelets: 265 10*3/uL (ref 150–400)
RBC: 3.84 MIL/uL — ABNORMAL LOW (ref 3.87–5.11)
RDW: 14.6 % (ref 11.5–15.5)
WBC: 6.7 10*3/uL (ref 4.0–10.5)
nRBC: 0 % (ref 0.0–0.2)

## 2018-04-22 LAB — PROTIME-INR
INR: 1.26
Prothrombin Time: 15.7 seconds — ABNORMAL HIGH (ref 11.4–15.2)

## 2018-04-22 LAB — HEPARIN LEVEL (UNFRACTIONATED): Heparin Unfractionated: 0.5 IU/mL (ref 0.30–0.70)

## 2018-04-22 LAB — MAGNESIUM: Magnesium: 2 mg/dL (ref 1.7–2.4)

## 2018-04-22 MED ORDER — FUROSEMIDE 80 MG PO TABS
160.0000 mg | ORAL_TABLET | Freq: Two times a day (BID) | ORAL | Status: DC
  Administered 2018-04-22 – 2018-04-24 (×3): 160 mg via ORAL
  Filled 2018-04-22 (×4): qty 2

## 2018-04-22 MED ORDER — OXYCODONE HCL 5 MG PO TABS
5.0000 mg | ORAL_TABLET | Freq: Two times a day (BID) | ORAL | Status: DC | PRN
  Administered 2018-04-22 – 2018-04-23 (×2): 5 mg via ORAL
  Filled 2018-04-22 (×2): qty 1

## 2018-04-22 NOTE — Progress Notes (Signed)
[place] for heparin Indication: atrial fibrillation  Allergies  Allergen Reactions  . Ace Inhibitors Anaphylaxis and Swelling  . Motrin Ib [Ibuprofen] Anaphylaxis    Patient Measurements: Height: 5\' 9"  (175.3 cm) Weight: 122 lb (55.3 kg) IBW/kg (Calculated) : 66.2 Heparin Dosing Weight: 65.2 kg   Vital Signs: Temp: 98.3 F (36.8 C) (10/24 0519) Temp Source: Oral (10/24 0519) BP: 139/77 (10/24 0519) Pulse Rate: 60 (10/24 0519)  Labs: Recent Labs    04/20/18 0426 04/20/18 1826 04/21/18 0408 04/22/18 0431 04/22/18 0732  HGB 9.3*  --  9.0* 9.1*  --   HCT 28.7*  --  27.6* 29.1*  --   PLT 298  --  279 265  --   LABPROT 16.7*  --  16.2*  --  15.7*  INR 1.37  --  1.32  --  1.26  HEPARINUNFRC 0.31 0.44 0.56 0.50  --   CREATININE 4.94*  --  5.48* 5.74*  --     Estimated Creatinine Clearance: 8.4 mL/min (A) (by C-G formula based on SCr of 5.74 mg/dL (H)).   Medical History: Past Medical History:  Diagnosis Date  . Anemia    low iron  . Anxiety   . Arthritis   . Bronchitis   . CHF (congestive heart failure) ([place])   . CKD (chronic kidney disease) stage 4, GFR 15-29 ml/min (HCC) 03/16/2017  . Coronary artery disease   . Depression   . Diabetes mellitus without complication ([place])   . Diet-controlled diabetes mellitus ([place])   . Ganglion cyst    left wrist  . GERD (gastroesophageal reflux disease)   . Headache    chronic headaches  . Heart failure ([place])   . Hyperlipidemia   . Hypertension   . Hypothyroidism   . Mitral regurgitation   . Myocardial infarction ([place])    3x last one 2008  . PAF (paroxysmal atrial fibrillation) ([place])   . Pneumonia    x 2  . S/P Maze operation for atrial fibrillation 02/18/2018   Complete bilateral atrial lesion set using bipolar radiofrequency and cryothermy ablation with clipping of LA appendage  . S/P mitral valve replacement with bioprosthetic valve 02/18/2018   [facility] Mitral stented  bovine pericardial tissue valve Model 7300 TFX Serial # [id number] Size 29  . Stroke [facility])    no lasting residual - ? 2014  . Umbilical hernia     Medications:  Scheduled:  . amLODipine  10 mg Oral Daily  . aspirin EC  81 mg Oral Daily  . atorvastatin  80 mg Oral Daily  . B-complex with vitamin C  1 tablet Oral Daily  . buPROPion  100 mg Oral TID  . busPIRone  15 mg Oral BID  . carvedilol  25 mg Oral BID  . Chlorhexidine Gluconate Cloth  6 each Topical Q0600  . Chlorhexidine Gluconate Cloth  6 each Topical Q0600  . cholecalciferol  1,000 Units Oral Daily  . darbepoetin (ARANESP) injection - NON-DIALYSIS  60 mcg Subcutaneous Q Wed-1800  . doxazosin  8 mg Oral Daily   And  . doxazosin  4 mg Oral QHS  . DULoxetine  20 mg Oral Daily  . hydrALAZINE  100 mg Oral Q8H  . isosorbide mononitrate  120 mg Oral Daily  . levothyroxine  112 mcg Oral QAC breakfast  . montelukast  10 mg Oral Daily  . pantoprazole  40 mg Oral BID AC  . traZODone  25 mg Oral QHS  Assessment: 50 yoF on warfarin PTA for Afib - warfarin now on hold  planning to undergo procedures. Pt reported home regimen as 2.5 mg daily (last dose 10/17 PM).   INR remains subtherapeutic  Heparin level remains therapeutic at 0.5 on heparin 1150 units/hr. No signs/symptoms of bleeding or issues with infusion reported. CBC low stable  Goal of Therapy:  INR 2-3 Heparin level 0.3-0.7 units/ml Monitor platelets by anticoagulation protocol: Yes   Plan:  Continue heparin infusion 1150 units/hr Hold warfarin until HD access plans confirmed Monitor daily HL, CBC, and for s/sx of bleeding   Bonnita Nasuti Pharm.D. CPP, BCPS Clinical Pharmacist [phone] 04/22/2018 10:01 AM

## 2018-04-22 NOTE — Progress Notes (Addendum)
Family Medicine Teaching Service Daily Progress Note Intern Pager: [phone]  Patient name: Jocelyn Hill Medical record number: [id number] Date of birth: 06/01/1951 Age: 67 y.o. Gender: female  Primary Care Provider: [name], MD Consultants: Cardiology, Nephrology, VVS Code Status: Full   Pt Overview and Major Events to Date:  04/17/18 - Admitted to [facility]  04/18/18 - Echo EF 30-35% 04/19/18 - Temporary HD cath placed  04/20/18 - Temp cath removed 04/23/18 - *   Assessment and Plan: Jocelyn Hill a 67 y.o.femalepresenting from cardiology officewith elevated creatinine and chest pain. PMH is significant forCHF, CKD IV, HTN,CAD s/p MI and CABG (01/2018),hypothyroidism, paroxysmalA-fibs/p MAZE (01/2018),mitral regurgitation s/p mitral valve replacement (01/2018), microcytic anemia, anxiety/depression.  Chronic "Lightheadedness": Improved today. Decreased Cymbalta to 20mg  without any reported AE. Will continue to monitor.  HFrEF exacerbation: VVS consulted. Per VVS and nephro, placement of AVF +/- TDC (if worsening renal function) planned for 10/25. SOB improved. LE edema significantly improved since yesterday. Trace to no LE edema bilaterally. 24-hr UOP 3.2L. Wt down from yesterday (56.6>55.3kg). Wt on admission 65.2kg. For now, will continue to diurese with high dose lasix. Transition from IV to PO.   - VVS, cardiology and nephrology following - Transition to160mg  PO lasix BID - Daily weights/Strict I&O's - Continue fluid restriction  - Continue coreg, hydralazine, and Imdur per cards recommendations  - PT/OT recommend SNF, rolling walker w/ 5" wheels and 3n1 - Follow-up AM labs (CBC, renal panel, PT/INR) - Renal diet, NPO after midnight  CKD-V: Bilateral UE vein mapping completed. LE edema improved significantly. Femoral cath removed 2/2 to pain and improved kidney function. AVS +/- TDC on 10/25. Will follow up. For now, will continue high dose lasix. Will  transition from IV to PO. Creatinine and BUN slightly worsened overnight Cr 5.48>5.74, BUN 37>40. K+ 3.4>3.4. Phos 4.4>5.0>5.0. Will check magnesium level  Phosphate stable, but may need to consider phosphate binders in the future due to increase during admission. - Nephrology and VVS following - Follow-up magnesium level - Transition to160mg  PO lasix BID - Tylenol PRN pain - follow renal function labs - zofran as needed nausea - Consider phosphate binders  Atypical ChestPain: Chronic, Stable.. S/p CABG with LIMA-LAD and SVG-LCx on 01/2018. Patient has history of CAD s/p MI in August 2019 with CABG and biventricular pacemaker in place 2/2 to complete heart block. Troponins with low flat trend, per cardiology not c/w ACS. Likely demand ischemia in setting of acute CHF exacerbation and anemia and worsening renal function . Echo 30-35%, G2DD. Patient has chronic atypical chest pain, continues to have CP on and off. Improved today with dose of Oxy. Due to worsening kidney disease will renally adjust Oxy from q4 to q12. - Continuous cardiac monitor  - Contiuous pulse ox with O2 PRN - Continue Aspirin 81mg  - Continue Oxy 5mg  IR q12 hours PRN pain  - Continue home Atorvastatin, Coreg, doxazosin, hydralazine, Imdur - Hold home warfarin, on IV Heparin for potential cath placement - pharmacy managing   - Pending outcome of AVF/TDC, will need to consider anticoag plan prior to discharge  HTN: BP improved with increases in Coreg and Doxazosin. AM BP of 139/77. On home hydralazine, amlodipine, Coreg, doxazosin. Per nephro, will d/c Amlodipine at this time to avoid low BP with continued diuresis - Continue increased dose of Coreg 25mg  - Continue increased dose of Doxazosin 8mg   - Continue hydralazine - Stop Amlodipine - Continue to monitor  History of Mitral regurgitation s/p mitral valve  replacement Chronic, stable. 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 Biprosthetic MV replacement in August 2019.  Post-op echo was stable. Echo unchanged in regards to MV.  Atrial Fibrillation s/p MAZE: Chronic, stable.  MAZE procedure in August 2019. No recurrence since.  - Hold warfarin, on IV heparin for HD potential AVF/TDC placement  Microcytic Anemia: chroic, stable Currently stable at 9.0. Will continue to monitor -Ferric gluconate 125mg  qMWF with HD -Per nephro, begin ESA  Hypothyroidism:  TSH on 10/26/17: 2.884. On Synthroid. - Continue home synthroid.  GERD: - Continue home protonix 40mg  BID  Anxiety/Depression: Chronic, stable.  On home Wellbutrin, Cymbalta, Xanax PRN, and Trazodone. Cymbalta decreased to 20mg  due to BEERs criteria. Patient tolerating well, admits to feeling lonely. - Continue decreased dose of Cymbalta 20mg  QD - Continue home Wellbutrin and Trazodone - Home Xanax PRN qHS anxiety   FEN/GI: - Renal diet, NPO after midnight Prophylaxis: Warfarin  Disposition: continued inpatient stay, ultimately will d/c to SNF  Subjective:  Abdominal pain and chest pain improved today after dose of Oxy 5mg . Tolerating decrease in Cymbalta. Feels lonely but daughter is sick with the Flu and unable to visit these last few day. Denies any nausea, vomiting. Last BM 10/22. Tolerating foods well and ambulating without problem.  Objective: Temp:  [97.8 F (36.6 C)-98.9 F (37.2 C)] 98.3 F (36.8 C) (10/24 0519) Pulse Rate:  [60-67] 60 (10/24 0519) Resp:  [16-18] 16 (10/24 0519) BP: (127-139)/(75-79) 139/77 (10/24 0519) SpO2:  [98 %-100 %] 100 % (10/24 0519) Weight:  [55.3 kg] 55.3 kg (10/24 0519)  Physical Exam:  Gen: NAD, alert, non-toxic, well-appearing, sitting comfortably on side of bed Skin: Warm and dry. No obvious rashes, lesions, or trauma. HEENT: NCAT No conjunctival pallor or injection. No scleral icterus or injection.  MMM.  CV: RRR.  <2s capillary refill bilaterally.  RP & DPs 2+ bilaterally. Minimal LE edema bilaterally,  Resp: CTAB.  No wheezing, rales,  abnormal lung sounds.  No increased WOB Abd: Abdomen NTTP. Positive bowel sounds. Psych: Cooperative with exam. Pleasant. Makes eye contact. Speech normal. Extremities: Moves all extremities spontaneously   Laboratory: Recent Labs  Lab 04/20/18 0426 04/21/18 0408 04/22/18 0431  WBC 8.2 7.5 6.7  HGB 9.3* 9.0* 9.1*  HCT 28.7* 27.6* 29.1*  PLT 298 279 265   Recent Labs  Lab 04/16/18 1805  04/20/18 0426 04/21/18 0408 04/22/18 0431  NA 136   < > 137 137 137  K 4.7   < > 3.5 3.4* 3.4*  CL 100   < > 102 100 100  CO2 21*   < > 26 26 25   BUN 59*   < > 32* 37* 40*  CREATININE 6.03*   < > 4.94* 5.48* 5.74*  CALCIUM 9.4   < > 9.8 9.8 10.0  PROT 6.3*  --   --   --   --   BILITOT 0.5  --   --   --   --   ALKPHOS 76  --   --   --   --   ALT 15  --   --   --   --   AST 16  --   --   --   --   GLUCOSE 229*   < > 106* 100* 106*   < > = values in this interval not displayed.    Ref. Range 04/18/2018 02:29  Iron Latest Ref Range: 28 - 170 ug/dL 18 (L)  UIBC Latest Units: ug/dL 206  TIBC Latest Ref Range: 250 - 450 ug/dL 224 (L)  Saturation Ratios Latest Ref Range: 10.4 - 31.8 % 8 (L)    Imaging/Diagnostic Tests: Dg Chest 2 View  Result Date: 04/16/2018 CLINICAL DATA:  67 y/o  F; chest pain. EXAM: CHEST - 2 VIEW COMPARISON:  03/16/2018 chest radiograph. FINDINGS: Stable cardiomegaly given projection and technique. Mitral valve replacement and PFO closure. Post CABG with sternotomy wires in alignment. Stable 3 lead pacemaker. Small bilateral pleural effusions and chronic pulmonary vascular congestion. No consolidation or pneumothorax. Calcific aortic atherosclerosis. Bones are unremarkable. IMPRESSION: Stable small bilateral pleural effusions, pulmonary vascular congestion, and cardiomegaly. Electronically Signed   By: Kristine Garbe M.D.   On: 04/16/2018 23:36   Mina Marble, DO PGY-1, [facility] 04/22/2018 11:30 AM FPTS Intern pager: [phone], text pages  welcome

## 2018-04-22 NOTE — Progress Notes (Signed)
[facility] has received [facility] authorization for patient and will accept patient when medically stable. CSW to follow for medical readiness and support with discharge planning.  Estanislado Emms, [place]

## 2018-04-22 NOTE — Progress Notes (Signed)
Subjective:   had 3200 of UOP on high dose lasix.  She says she feels better.   Kidney numbers did worsen from yesterday though  - BP is better  Objective Vital signs in last 24 hours: Vitals:   04/21/18 2056 04/21/18 2100 04/21/18 2138 04/22/18 0519  BP: 137/79   139/77  Pulse:   65 60  Resp:   18 16  Temp:   97.8 F (36.6 C) 98.3 F (36.8 C)  TempSrc:  Oral  Oral  SpO2:   100% 100%  Weight:    55.3 kg  Height:       Weight change: -1.225 kg  Intake/Output Summary (Last 24 hours) at 04/22/2018 1040 Last data filed at 04/22/2018 0600 Gross per 24 hour  Intake 698.51 ml  Output 3200 ml  Net -2501.49 ml    Assessment/ Plan: Pt is a 67 y.o. yo female with advanced CKD at baseline who was admitted on 04/16/2018 with what appeared to be uremic symptoms with BUN in the high 50's  Assessment/Plan: 1. Renal- felt to be uremic but BUN only 50's- s/p temp cath and HD on 10/20- was planning for another treatment 10/21 however,  was making good urine and BUN only 26- I decided to observe, numbers have  worsened since then but clinically is improved. I think is feeling better due to having volume status improved - have removed femoral temp cath.  Definitely think she is at risk of needing dialysis again soon given trend.  I think good to place AVG tomorrow as planned AND if BUN and crt rise again tomorrow do not think she will last a month without HD so should get [facility] as well and [place] just go ahead and initiate HD.  Thank you VVS for your prompt response   2. Anemia- hgb trending up without transfusion- 9.0 today - on iron, have added ESA   3. Secondary hyperparathyroidism- has been on nutritional vit D only - phos OK , pth 128 4. HTN/volume- BP high- as OP on amlodipine, coreg, cardura, hydralazine and high dose diuretics - BP much better as volume status is better.  Will change lasix to PO and stop norvasc so BP does not get too low 5. Dispo- give me another 24 hours to determine if we will  need to start chronic hemo this admit   [place]: Basic Metabolic Panel: Recent Labs  Lab 04/20/18 0426 04/21/18 0408 04/22/18 0431  NA 137 137 137  K 3.5 3.4* 3.4*  CL 102 100 100  CO2 26 26 25   GLUCOSE 106* 100* 106*  BUN 32* 37* 40*  CREATININE 4.94* 5.48* 5.74*  CALCIUM 9.8 9.8 10.0  PHOS 4.4 5.0* 5.0*   Liver Function Tests: Recent Labs  Lab 04/16/18 1805 04/20/18 0426 04/21/18 0408 04/22/18 0431  AST 16  --   --   --   ALT 15  --   --   --   ALKPHOS 76  --   --   --   BILITOT 0.5  --   --   --   PROT 6.3*  --   --   --   ALBUMIN 3.3* 3.2* 3.1* 3.3*   No results for input(s): LIPASE, AMYLASE in the last 168 hours. No results for input(s): AMMONIA in the last 168 hours. CBC: Recent Labs  Lab 04/18/18 0229 04/19/18 0651 04/20/18 0426 04/21/18 0408 04/22/18 0431  WBC 7.5 7.1 8.2 7.5 6.7  HGB 9.0*  9.7* 9.3* 9.0* 9.1*  HCT 27.3* 31.1* 28.7* 27.6* 29.1*  MCV 74.0* 75.3* 74.5* 74.6* 75.8*  PLT 292 292 298 279 265   Cardiac Enzymes: Recent Labs  Lab 04/16/18 1805 04/16/18 2335 04/17/18 0320 04/17/18 0942 04/17/18 1416  TROPONINI 0.07* 0.07* 0.07* 0.07* 0.06*   CBG: Recent Labs  Lab 04/19/18 1412  GLUCAP 97    Iron Studies:  No results for input(s): IRON, TIBC, TRANSFERRIN, FERRITIN in the last 72 hours. Studies/Results: No results found. Medications: Infusions: . ferric gluconate (FERRLECIT/NULECIT) IV Stopped (04/21/18 1900)  . furosemide 66 mL/hr at 04/22/18 0600  . heparin 1,150 Units/hr (04/22/18 0600)    Scheduled Medications: . amLODipine  10 mg Oral Daily  . aspirin EC  81 mg Oral Daily  . atorvastatin  80 mg Oral Daily  . B-complex with vitamin C  1 tablet Oral Daily  . buPROPion  100 mg Oral TID  . busPIRone  15 mg Oral BID  . carvedilol  25 mg Oral BID  . Chlorhexidine Gluconate Cloth  6 each Topical Q0600  . Chlorhexidine Gluconate Cloth  6 each Topical Q0600  . cholecalciferol  1,000 Units Oral  Daily  . darbepoetin (ARANESP) injection - NON-DIALYSIS  60 mcg Subcutaneous Q Wed-1800  . doxazosin  8 mg Oral Daily   And  . doxazosin  4 mg Oral QHS  . DULoxetine  20 mg Oral Daily  . hydrALAZINE  100 mg Oral Q8H  . isosorbide mononitrate  120 mg Oral Daily  . levothyroxine  112 mcg Oral QAC breakfast  . montelukast  10 mg Oral Daily  . pantoprazole  40 mg Oral BID AC  . traZODone  25 mg Oral QHS    have reviewed scheduled and prn medications.  Physical Exam: General: flat, - difficult to read- does not appear uremic Heart: RRR Lungs: mostly clear- dec BS at extreme bases Abdomen: distended but better Extremities:  Really no periph edema Dialysis Access: none   04/22/2018,10:40 AM  LOS: 5 days

## 2018-04-22 NOTE — Progress Notes (Addendum)
Advanced Heart Failure Rounding Note  PCP-Cardiologist: [name], MD   Subjective:    Remains on lasix 160 mg q6 hours. She voided 2.3 L yesterday. Weight down 2 more lbs (21 lbs total). Creatinine trending up 5.74, BUN 40.  Vein mapping complete. She is getting AVG placed on Friday.    BP 120-140s.  SOB still improving, not back to baseline. CP is about the same. Abdominal pain comes and goes.   Objective:   Weight Range: 55.3 kg Body mass index is 18.02 kg/m.   Vital Signs:   Temp:  [97.8 F (36.6 C)-98.9 F (37.2 C)] 98.3 F (36.8 C) (10/24 0519) Pulse Rate:  [60-67] 60 (10/24 0519) Resp:  [16-18] 16 (10/24 0519) BP: (127-139)/(75-79) 139/77 (10/24 0519) SpO2:  [98 %-100 %] 100 % (10/24 0519) Weight:  [55.3 kg] 55.3 kg (10/24 0519) Last BM Date: 04/20/18  Weight change: Filed Weights   04/20/18 0613 04/21/18 0426 04/22/18 0519  Weight: 58.7 kg 56.6 kg 55.3 kg    Intake/Output:   Intake/Output Summary (Last 24 hours) at 04/22/2018 1044 Last data filed at 04/22/2018 0600 Gross per 24 hour  Intake 698.51 ml  Output 3200 ml  Net -2501.49 ml      Physical Exam   General: No resp difficulty. Appears fatigued. Thin.  HEENT: Normal Neck: Supple. JVP to jaw. Carotids 2+ bilat; no bruits. No thyromegaly or nodule noted. Cor: PMI nondisplaced. RRR, No M/G/R noted Lungs: crackles in bases Abdomen: Soft, non-tender, non-distended, no HSM. No bruits or masses. +BS  Extremities: No cyanosis, clubbing, or rash. RLE trace ankle edema. LLE no edema.  Neuro: Alert & orientedx3, cranial nerves grossly intact. moves all 4 extremities w/o difficulty. Affect pleasant  Telemetry   BiV paced with underlying NSR 70s. Personally reviewed.   EKG    No new tracings.   Labs    CBC Recent Labs    04/21/18 0408 04/22/18 0431  WBC 7.5 6.7  HGB 9.0* 9.1*  HCT 27.6* 29.1*  MCV 74.6* 75.8*  PLT 279 295   Basic Metabolic Panel Recent Labs     04/21/18 0408 04/22/18 0431  NA 137 137  K 3.4* 3.4*  CL 100 100  CO2 26 25  GLUCOSE 100* 106*  BUN 37* 40*  CREATININE 5.48* 5.74*  CALCIUM 9.8 10.0  PHOS 5.0* 5.0*   Liver Function Tests Recent Labs    04/21/18 0408 04/22/18 0431  ALBUMIN 3.1* 3.3*   No results for input(s): LIPASE, AMYLASE in the last 72 hours. Cardiac Enzymes No results for input(s): CKTOTAL, CKMB, CKMBINDEX, TROPONINI in the last 72 hours.  BNP: BNP (last 3 results) Recent Labs    02/17/18 0353 03/11/18 1458 04/16/18 1805  BNP 675.7* 1,452.6* 2,442.0*    ProBNP (last 3 results) No results for input(s): PROBNP in the last 8760 hours.   D-Dimer No results for input(s): DDIMER in the last 72 hours. Hemoglobin A1C No results for input(s): HGBA1C in the last 72 hours. Fasting Lipid Panel No results for input(s): CHOL, HDL, LDLCALC, TRIG, CHOLHDL, LDLDIRECT in the last 72 hours. Thyroid Function Tests No results for input(s): TSH, T4TOTAL, T3FREE, THYROIDAB in the last 72 hours.  Invalid input(s): FREET3  Other results:   Imaging    No results found.   Medications:     Scheduled Medications: . amLODipine  10 mg Oral Daily  . aspirin EC  81 mg Oral Daily  . atorvastatin  80 mg Oral Daily  .  B-complex with vitamin C  1 tablet Oral Daily  . buPROPion  100 mg Oral TID  . busPIRone  15 mg Oral BID  . carvedilol  25 mg Oral BID  . Chlorhexidine Gluconate Cloth  6 each Topical Q0600  . Chlorhexidine Gluconate Cloth  6 each Topical Q0600  . cholecalciferol  1,000 Units Oral Daily  . darbepoetin (ARANESP) injection - NON-DIALYSIS  60 mcg Subcutaneous Q Wed-1800  . doxazosin  8 mg Oral Daily   And  . doxazosin  4 mg Oral QHS  . DULoxetine  20 mg Oral Daily  . hydrALAZINE  100 mg Oral Q8H  . isosorbide mononitrate  120 mg Oral Daily  . levothyroxine  112 mcg Oral QAC breakfast  . montelukast  10 mg Oral Daily  . pantoprazole  40 mg Oral BID AC  . traZODone  25 mg Oral QHS     Infusions: . ferric gluconate (FERRLECIT/NULECIT) IV Stopped (04/21/18 1900)  . furosemide 66 mL/hr at 04/22/18 0600  . heparin 1,150 Units/hr (04/22/18 0600)    PRN Medications: acetaminophen, albuterol, ALPRAZolam, ondansetron (ZOFRAN) IV, oxyCODONE    Patient Profile   Jocelyn Hill. Broweris a67 y.o.femalewith h/o mitral regurgitation s/p bioprosthetic MVR 0/0867, chronic diastolic CHF, CAD s/p CABG with LIMA-LAD and SVG-LCx 01/2018, long standing HTN, DM2, CKD IV-V, h/o Stroke, anxiety, depression, GERD. Admitted in 8/19 for Bioprosthetic MVR, Maze, LA appendage clip, PFO closure, and CABG with LIMA-LAD and SVG-LCx. This was complicated by AKI and complete heart block. St Jude CRT-P device placed. Echo in 8/19 post-op showed EF 45-50%, stable bioprosthetic mitral valve.   She was sent to ED 04/16/18 with A/C HF and creatinine 6.0.   Assessment/Plan   1. Acute on chronic combined CHF: Echo (8/19, post-op) with EF 45-50%. Has St Jude CRT-P device due to post-op complete heart block.  - Echo this admission with lower EF 30-35%. - Volume much improved, but still elevated. JVP still to jaw.  - Remains on IV lasix 160 mg q6 hours with improving UOP. Diuretics per renal. Creatinine up to 5.74, BUN 40. - Continue coreg 25 mg BID - Continue hydralazine 100 mg q8 and imdur 120 mg daily - No spiro with CKD IV - No ACE with hx of angioedema/CKD IV 2. CKD Stage IV: Had R hydronephrosis in setting of hydronephrosis and required percutaneous drain in 9/18 (removed). Renal US 04/14/17 showed no further evidence of hydronephrosis. - Creatinine peaked at 6.17.  - Had HD with 2 L off 10/20. Remains on high dose IV lasix with improving UOP. Creatinine trending up 5.74. BUN 40. - Coumadin on hold. Remains on heparin for tunneled HD cath +/- AVF. Vein mapping completed. Planning for perm access tomorrow. Unclear if she will need to start iHD prior to DC. Per renal.  3. HTN:SBP  120-140s - Continue hydralazine 100 mg tid.  - Continue amlodipine 10 mg daily.  -Continue coreg 25 mg BID.  - Continue doxazosin 8 qam/4 qpm. 4. 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 Had bioprosthetic MVR in 8/19. Post-op echo in 8/19 showed stable bioprosthetic mitral valve. Echo this admission showed mean gradient 5 mm Hg. No change. 5. Atrial fibrillation: Paroxysmal. Now s/p Maze in 8/19. Remains in NSR. - Coumadin held. She is on heparin drip now for perm HD cath placement. No change.  6. CAD: LHC in 1/19 showed severe ostial LCx disease and moderate LAD disease. She had LIMA-LAD and SVG-OM in 8/19.  - She has chronic atypical CP. Continues  to have CP on and off, lasts about 30 minutes. Unrelated to activity. Troponin trend flat. EKG unchanged. Not thought to be ischemic. No change.  - Continue ASA 81 and atorvastatin 80 mg daily.Good lipids 03/2018 -ContinueImdur 120 mg daily. Continue Coreg 25 mg BID. 7. Disposition - PT/OT recommending SNF. Has SNF bed when ready.   Medication concerns reviewed with patient and pharmacy team. Barriers identified: none  Length of Stay: [place], NP  04/22/2018, 10:44 AM  Advanced Heart Failure Team Pager [phone] (M-F; [place])  Please contact [facility] for night-coverage after hours (4p -7a ) and weekends on [email or web address]  Patient seen with NP, agree with the above note.   UOP improving and volume status improving but BUN/creatinine continue to slowly rise.  - Transitioning to po Lasix today and stopping amlodipine to prevent BP lowering too much.   Plan for AV graft placement tomorrow.  No HD yet but suspect it will be necessary some time in the next few weeks.   [name] 04/22/2018 11:11 AM

## 2018-04-23 ENCOUNTER — Encounter (HOSPITAL_COMMUNITY)
Admission: EM | Disposition: A | Discharge status: Skilled Nursing Facility | Payer: Self-pay | Source: Ambulatory Visit | Attending: Family Medicine

## 2018-04-23 ENCOUNTER — Inpatient Hospital Stay (HOSPITAL_COMMUNITY): Payer: Medicare Other | Admitting: Anesthesiology

## 2018-04-23 ENCOUNTER — Telehealth: Payer: Self-pay | Admitting: Vascular Surgery

## 2018-04-23 ENCOUNTER — Encounter (HOSPITAL_COMMUNITY): Payer: Self-pay

## 2018-04-23 ENCOUNTER — Inpatient Hospital Stay (HOSPITAL_COMMUNITY): Payer: Medicare Other

## 2018-04-23 DIAGNOSIS — F411 Generalized anxiety disorder: Secondary | ICD-10-CM

## 2018-04-23 DIAGNOSIS — N185 Chronic kidney disease, stage 5: Secondary | ICD-10-CM

## 2018-04-23 HISTORY — PX: INSERTION OF DIALYSIS CATHETER: SHX1324

## 2018-04-23 HISTORY — PX: AV FISTULA PLACEMENT: SHX1204

## 2018-04-23 LAB — CBC
HCT: 28.5 % — ABNORMAL LOW (ref 36.0–46.0)
HCT: 31.3 % — ABNORMAL LOW (ref 36.0–46.0)
Hemoglobin: 9.3 g/dL — ABNORMAL LOW (ref 12.0–15.0)
Hemoglobin: 10.2 g/dL — ABNORMAL LOW (ref 12.0–15.0)
MCH: 24.1 pg — ABNORMAL LOW (ref 26.0–34.0)
MCH: 24.2 pg — ABNORMAL LOW (ref 26.0–34.0)
MCHC: 32.6 g/dL (ref 30.0–36.0)
MCHC: 32.6 g/dL (ref 30.0–36.0)
MCV: 73.8 fL — ABNORMAL LOW (ref 80.0–100.0)
MCV: 74.2 fL — ABNORMAL LOW (ref 80.0–100.0)
Platelets: 301 10*3/uL (ref 150–400)
Platelets: 285 10*3/uL (ref 150–400)
RBC: 3.86 MIL/uL — ABNORMAL LOW (ref 3.87–5.11)
RBC: 4.22 MIL/uL (ref 3.87–5.11)
RDW: 14.4 % (ref 11.5–15.5)
RDW: 14.6 % (ref 11.5–15.5)
WBC: 7.6 10*3/uL (ref 4.0–10.5)
WBC: 8.3 10*3/uL (ref 4.0–10.5)
nRBC: 0 % (ref 0.0–0.2)
nRBC: 0 % (ref 0.0–0.2)

## 2018-04-23 LAB — RENAL FUNCTION PANEL
Albumin: 3.3 g/dL — ABNORMAL LOW (ref 3.5–5.0)
Albumin: 3.3 g/dL — ABNORMAL LOW (ref 3.5–5.0)
Anion gap: 12 (ref 5–15)
Anion gap: 14 (ref 5–15)
BUN: 41 mg/dL — ABNORMAL HIGH (ref 8–23)
BUN: 40 mg/dL — ABNORMAL HIGH (ref 8–23)
CO2: 28 mmol/L (ref 22–32)
CO2: 26 mmol/L (ref 22–32)
Calcium: 10.3 mg/dL (ref 8.9–10.3)
Calcium: 10.4 mg/dL — ABNORMAL HIGH (ref 8.9–10.3)
Chloride: 97 mmol/L — ABNORMAL LOW (ref 98–111)
Chloride: 97 mmol/L — ABNORMAL LOW (ref 98–111)
Creatinine, Ser: 6.38 mg/dL — ABNORMAL HIGH (ref 0.44–1.00)
Creatinine, Ser: 6.32 mg/dL — ABNORMAL HIGH (ref 0.44–1.00)
GFR calc Af Amer: 7 mL/min — ABNORMAL LOW (ref >60)
GFR calc Af Amer: 7 mL/min — ABNORMAL LOW (ref >60)
GFR calc non Af Amer: 6 mL/min — ABNORMAL LOW (ref >60)
GFR calc non Af Amer: 6 mL/min — ABNORMAL LOW (ref >60)
Glucose, Bld: 103 mg/dL — ABNORMAL HIGH (ref 70–99)
Glucose, Bld: 123 mg/dL — ABNORMAL HIGH (ref 70–99)
Phosphorus: 4.9 mg/dL — ABNORMAL HIGH (ref 2.5–4.6)
Phosphorus: 5.2 mg/dL — ABNORMAL HIGH (ref 2.5–4.6)
Potassium: 3.2 mmol/L — ABNORMAL LOW (ref 3.5–5.1)
Potassium: 3.7 mmol/L (ref 3.5–5.1)
Sodium: 137 mmol/L (ref 135–145)
Sodium: 137 mmol/L (ref 135–145)

## 2018-04-23 LAB — GLUCOSE, CAPILLARY: Glucose-Capillary: 138 mg/dL — ABNORMAL HIGH (ref 70–99)

## 2018-04-23 LAB — HEPARIN LEVEL (UNFRACTIONATED): Heparin Unfractionated: 0.51 IU/mL (ref 0.30–0.70)

## 2018-04-23 LAB — HEPATITIS B SURFACE ANTIGEN

## 2018-04-23 LAB — PROTIME-INR
INR: 1.29
Prothrombin Time: 15.9 seconds — ABNORMAL HIGH (ref 11.4–15.2)

## 2018-04-23 SURGERY — ARTERIOVENOUS (AV) FISTULA CREATION
Anesthesia: Monitor Anesthesia Care | Site: Chest | Laterality: Right

## 2018-04-23 MED ORDER — PROPOFOL 500 MG/50ML IV EMUL
INTRAVENOUS | Status: DC | PRN
  Administered 2018-04-23: 75 ug/kg/min via INTRAVENOUS

## 2018-04-23 MED ORDER — ONDANSETRON HCL 4 MG/2ML IJ SOLN
INTRAMUSCULAR | Status: AC
  Filled 2018-04-23: qty 2

## 2018-04-23 MED ORDER — HEPARIN SODIUM (PORCINE) 1000 UNIT/ML IJ SOLN
INTRAMUSCULAR | Status: AC
  Filled 2018-04-23: qty 1

## 2018-04-23 MED ORDER — LIDOCAINE HCL (CARDIAC) PF 100 MG/5ML IV SOSY
PREFILLED_SYRINGE | INTRAVENOUS | Status: DC | PRN
  Administered 2018-04-23: 50 mg via INTRATRACHEAL

## 2018-04-23 MED ORDER — OXYCODONE HCL 5 MG PO TABS
5.0000 mg | ORAL_TABLET | Freq: Three times a day (TID) | ORAL | Status: DC | PRN
  Administered 2018-04-23 – 2018-04-25 (×5): 5 mg via ORAL
  Filled 2018-04-23 (×5): qty 1

## 2018-04-23 MED ORDER — SODIUM CHLORIDE 0.9 % IV SOLN
INTRAVENOUS | Status: AC
  Filled 2018-04-23: qty 1.2

## 2018-04-23 MED ORDER — CEFAZOLIN SODIUM-DEXTROSE 2-3 GM-%(50ML) IV SOLR
INTRAVENOUS | Status: DC | PRN
  Administered 2018-04-23: 2 g via INTRAVENOUS

## 2018-04-23 MED ORDER — HEPARIN SODIUM (PORCINE) 1000 UNIT/ML IJ SOLN
INTRAMUSCULAR | Status: DC | PRN
  Administered 2018-04-23: 3000 [IU] via INTRAVENOUS

## 2018-04-23 MED ORDER — MIDAZOLAM HCL 2 MG/2ML IJ SOLN
INTRAMUSCULAR | Status: AC
  Filled 2018-04-23: qty 2

## 2018-04-23 MED ORDER — CEFAZOLIN SODIUM-DEXTROSE 2-4 GM/100ML-% IV SOLN
INTRAVENOUS | Status: AC
  Filled 2018-04-23: qty 100

## 2018-04-23 MED ORDER — ROCURONIUM BROMIDE 50 MG/5ML IV SOSY
PREFILLED_SYRINGE | INTRAVENOUS | Status: AC
  Filled 2018-04-23: qty 5

## 2018-04-23 MED ORDER — LIDOCAINE 2% (20 MG/ML) 5 ML SYRINGE
INTRAMUSCULAR | Status: AC
  Filled 2018-04-23: qty 5

## 2018-04-23 MED ORDER — ONDANSETRON HCL 4 MG/2ML IJ SOLN
INTRAMUSCULAR | Status: DC | PRN
  Administered 2018-04-23: 4 mg via INTRAVENOUS

## 2018-04-23 MED ORDER — WARFARIN SODIUM 4 MG PO TABS
4.0000 mg | ORAL_TABLET | Freq: Once | ORAL | Status: AC
  Administered 2018-04-23: 4 mg via ORAL
  Filled 2018-04-23: qty 1

## 2018-04-23 MED ORDER — MIDAZOLAM HCL 2 MG/2ML IJ SOLN
INTRAMUSCULAR | Status: DC | PRN
  Administered 2018-04-23: 2 mg via INTRAVENOUS

## 2018-04-23 MED ORDER — PROPOFOL 10 MG/ML IV BOLUS
INTRAVENOUS | Status: AC
  Filled 2018-04-23: qty 20

## 2018-04-23 MED ORDER — PROTAMINE SULFATE 10 MG/ML IV SOLN
INTRAVENOUS | Status: DC | PRN
  Administered 2018-04-23: 10 mg via INTRAVENOUS

## 2018-04-23 MED ORDER — HEMOSTATIC AGENTS (NO CHARGE) OPTIME
TOPICAL | Status: DC | PRN
  Administered 2018-04-23: 1 via TOPICAL

## 2018-04-23 MED ORDER — SODIUM CHLORIDE 0.9 % IV SOLN
INTRAVENOUS | Status: DC | PRN
  Administered 2018-04-23: 500 mL

## 2018-04-23 MED ORDER — FENTANYL CITRATE (PF) 250 MCG/5ML IJ SOLN
INTRAMUSCULAR | Status: DC | PRN
  Administered 2018-04-23 (×3): 50 ug via INTRAVENOUS

## 2018-04-23 MED ORDER — LIDOCAINE-EPINEPHRINE (PF) 1 %-1:200000 IJ SOLN
INTRAMUSCULAR | Status: AC
  Filled 2018-04-23: qty 30

## 2018-04-23 MED ORDER — LIDOCAINE HCL 1 % IJ SOLN
INTRAMUSCULAR | Status: DC | PRN
  Administered 2018-04-23: 15 mL

## 2018-04-23 MED ORDER — FENTANYL CITRATE (PF) 250 MCG/5ML IJ SOLN
INTRAMUSCULAR | Status: AC
  Filled 2018-04-23: qty 5

## 2018-04-23 MED ORDER — HYDROMORPHONE HCL 1 MG/ML IJ SOLN
0.5000 mg | Freq: Once | INTRAMUSCULAR | Status: AC
  Administered 2018-04-23: 0.5 mg via INTRAVENOUS
  Filled 2018-04-23: qty 1

## 2018-04-23 MED ORDER — 0.9 % SODIUM CHLORIDE (POUR BTL) OPTIME
TOPICAL | Status: DC | PRN
  Administered 2018-04-23: 1000 mL

## 2018-04-23 MED ORDER — DEXAMETHASONE SODIUM PHOSPHATE 10 MG/ML IJ SOLN
INTRAMUSCULAR | Status: AC
  Filled 2018-04-23: qty 1

## 2018-04-23 MED ORDER — WARFARIN - PHARMACIST DOSING INPATIENT
Freq: Every day | Status: DC
  Administered 2018-04-25: 17:00:00

## 2018-04-23 MED ORDER — CHLORHEXIDINE GLUCONATE CLOTH 2 % EX PADS
6.0000 | MEDICATED_PAD | Freq: Every day | CUTANEOUS | Status: DC
  Administered 2018-04-26 – 2018-05-01 (×3): 6 via TOPICAL

## 2018-04-23 MED ORDER — POLYETHYLENE GLYCOL 3350 17 G PO PACK
17.0000 g | PACK | Freq: Every day | ORAL | Status: DC
  Administered 2018-04-23 – 2018-05-01 (×7): 17 g via ORAL
  Filled 2018-04-23 (×8): qty 1

## 2018-04-23 MED ORDER — SODIUM CHLORIDE 0.9 % IV SOLN
INTRAVENOUS | Status: DC
  Administered 2018-04-23: 10:00:00 via INTRAVENOUS

## 2018-04-23 SURGICAL SUPPLY — 38 items
ARMBAND PINK RESTRICT EXTREMIT (MISCELLANEOUS) ×6 IMPLANT
BIOPATCH RED 1 DISK 7.0 (GAUZE/BANDAGES/DRESSINGS) ×1 IMPLANT
CANISTER SUCT 3000ML PPV (MISCELLANEOUS) ×3 IMPLANT
CATH PALINDROME RT-P 15FX19CM (CATHETERS) ×1 IMPLANT
CLIP VESOCCLUDE MED 6/CT (CLIP) ×3 IMPLANT
CLIP VESOCCLUDE SM WIDE 6/CT (CLIP) ×3 IMPLANT
COVER PROBE W GEL 5X96 (DRAPES) ×3 IMPLANT
COVER WAND RF STERILE (DRAPES) ×3 IMPLANT
DECANTER SPIKE VIAL GLASS SM (MISCELLANEOUS) ×3 IMPLANT
DERMABOND ADVANCED (GAUZE/BANDAGES/DRESSINGS) ×3
DERMABOND ADVANCED .7 DNX12 (GAUZE/BANDAGES/DRESSINGS) ×2 IMPLANT
ELECT REM PT RETURN 9FT ADLT (ELECTROSURGICAL) ×3
ELECTRODE REM PT RTRN 9FT ADLT (ELECTROSURGICAL) ×2 IMPLANT
GAUZE SPONGE 4X4 12PLY STRL LF (GAUZE/BANDAGES/DRESSINGS) ×1 IMPLANT
GLOVE BIO SURGEON STRL SZ7.5 (GLOVE) ×3 IMPLANT
GLOVE BIOGEL PI IND STRL 8 (GLOVE) ×2 IMPLANT
GLOVE BIOGEL PI INDICATOR 8 (GLOVE) ×1
GOWN STRL REUS W/ TWL LRG LVL3 (GOWN DISPOSABLE) ×4 IMPLANT
GOWN STRL REUS W/ TWL XL LVL3 (GOWN DISPOSABLE) ×4 IMPLANT
GOWN STRL REUS W/TWL LRG LVL3 (GOWN DISPOSABLE) ×2
GOWN STRL REUS W/TWL XL LVL3 (GOWN DISPOSABLE) ×2
GRAFT GORETEX STRT 4-7X45 (Vascular Products) ×1 IMPLANT
HEMOSTAT SNOW SURGICEL 2X4 (HEMOSTASIS) ×1 IMPLANT
HEMOSTAT SPONGE AVITENE ULTRA (HEMOSTASIS) IMPLANT
KIT BASIN OR (CUSTOM PROCEDURE TRAY) ×3 IMPLANT
KIT TURNOVER KIT B (KITS) ×3 IMPLANT
NS IRRIG 1000ML POUR BTL (IV SOLUTION) ×3 IMPLANT
PACK CV ACCESS (CUSTOM PROCEDURE TRAY) ×3 IMPLANT
PAD ARMBOARD 7.5X6 YLW CONV (MISCELLANEOUS) ×6 IMPLANT
SUT MNCRL AB 4-0 PS2 18 (SUTURE) ×4 IMPLANT
SUT PROLENE 6 0 BV (SUTURE) ×4 IMPLANT
SUT PROLENE 7 0 BV 1 (SUTURE) IMPLANT
SUT VIC AB 3-0 SH 27 (SUTURE) ×2
SUT VIC AB 3-0 SH 27X BRD (SUTURE) ×2 IMPLANT
TAPE CLOTH SURG 4X10 WHT LF (GAUZE/BANDAGES/DRESSINGS) ×1 IMPLANT
TOWEL GREEN STERILE (TOWEL DISPOSABLE) ×3 IMPLANT
UNDERPAD 30X30 (UNDERPADS AND DIAPERS) ×3 IMPLANT
WATER STERILE IRR 1000ML POUR (IV SOLUTION) ×3 IMPLANT

## 2018-04-23 NOTE — Progress Notes (Signed)
PT Cancellation Note  Patient Details Name: BRANDYCE DIMARIO MRN: [id number] DOB: [birth year]   Cancelled Treatment:    Reason Eval/Treat Not Completed: Patient at procedure or test/unavailable (AVF today). Will follow-up for PT treatment.  Mabeline Caras, PT, DPT Acute Rehabilitation Services  Pager [phone] Office [place] 04/23/2018, 10:01 AM

## 2018-04-23 NOTE — Transfer of Care (Signed)
Immediate Anesthesia Transfer of Care Note  Patient: Jocelyn Hill  Procedure(s) Performed: LEFT ARTERIOVENOUS (AV) GRAFT CREATION (Left Arm Upper) INSERTION OF DIALYSIS CATHETER (Right Chest)  Patient Location: PACU  Anesthesia Type:MAC  Level of Consciousness: awake, alert , oriented and patient cooperative  Airway & Oxygen Therapy: Patient Spontanous Breathing and Patient connected to nasal cannula oxygen  Post-op Assessment: Report given to RN and Post -op Vital signs reviewed and stable  Post vital signs: Reviewed and stable  Last Vitals:  Vitals Value Taken Time  BP 152/76 04/23/2018  1:47 PM  Temp    Pulse 63 04/23/2018  1:50 PM  Resp 16 04/23/2018  1:50 PM  SpO2 95 % 04/23/2018  1:50 PM  Vitals shown include unvalidated device data.  Last Pain:  Vitals:   04/23/18 0750  TempSrc: Oral  PainSc:       Patients Stated Pain Goal: 0 (24/40/10 2725)  Complications: No apparent anesthesia complications

## 2018-04-23 NOTE — Discharge Instructions (Signed)
° °  [facility] ° °Discharge Instructions ° °AV Fistula or Graft Surgery for Dialysis Access ° °Please refer to the following instructions for your post-procedure care. Your surgeon or physician assistant will discuss any changes with you. ° °Activity ° °You may drive the day following your surgery, if you are comfortable and no longer taking prescription pain medication. Resume full activity as the soreness in your incision resolves. ° °Bathing/Showering ° °You may shower after you go home. Keep your incision dry for 48 hours. Do not soak in a bathtub, hot tub, or swim until the incision heals completely. You may not shower if you have a hemodialysis catheter. ° °Incision Care ° °Clean your incision with mild soap and water after 48 hours. Pat the area dry with a clean towel. You do not need a bandage unless otherwise instructed. Do not apply any ointments or creams to your incision. You may have skin glue on your incision. Do not peel it off. It will come off on its own in about one week. Your arm may swell a bit after surgery. To reduce swelling use pillows to elevate your arm so it is above your heart. Your doctor will tell you if you need to lightly wrap your arm with an ACE bandage. ° °Diet ° °Resume your normal diet. There are not special food restrictions following this procedure. In order to heal from your surgery, it is CRITICAL to get adequate nutrition. Your body requires vitamins, minerals, and protein. Vegetables are the best source of vitamins and minerals. Vegetables also provide the perfect balance of protein. Processed food has little nutritional value, so try to avoid this. ° °Medications ° °Resume taking all of your medications. If your incision is causing pain, you may take over-the counter pain relievers such as acetaminophen (Tylenol). If you were prescribed a stronger pain medication, please be aware these medications can cause nausea and constipation. Prevent  nausea by taking the medication with a snack or meal. Avoid constipation by drinking plenty of fluids and eating foods with high amount of fiber, such as fruits, vegetables, and grains. Do not take Tylenol if you are taking prescription pain medications. ° ° ° ° °Follow up °Your surgeon may want to see you in the office following your access surgery. If so, this will be arranged at the time of your surgery. ° °Please call us immediately for any of the following conditions: ° °Increased pain, redness, drainage (pus) from your incision site °Fever of 101 degrees or higher °Severe or worsening pain at your incision site °Hand pain or numbness. ° °Reduce your risk of vascular disease: ° °Stop smoking. If you would like help, call [facility] at 1-800-QUIT-NOW (1-800-784-8669) or [facility] at 336-586-4000 ° °Manage your cholesterol °Maintain a desired weight °Control your diabetes °Keep your blood pressure down ° °Dialysis ° °It will take several weeks to several months for your new dialysis access to be ready for use. Your surgeon will determine when it is OK to use it. Your nephrologist will continue to direct your dialysis. You can continue to use your Permcath until your new access is ready for use. ° °If you have any questions, please call the office at [phone]. ° °

## 2018-04-23 NOTE — Progress Notes (Signed)
CSW continuing to follow for disposition planning. Per MD, patient not medically ready today. [facility] will likely need to get new [facility] authorization on Monday. Will send updated PT notes to facility when available. CSW to follow and support.  Estanislado Emms, [place]

## 2018-04-23 NOTE — Telephone Encounter (Signed)
-----   Message from Dagoberto Ligas, PA-C sent at 04/23/2018  1:32 PM EDT -----  Can you schedule an appt for 3-4 weeks on PA clinic.  PO L arm AVG. Thanks, [facility]

## 2018-04-23 NOTE — H&P (Signed)
History and Physical Interval Note:  04/23/2018 10:36 AM  Jocelyn Hill  has presented today for surgery, with the diagnosis of CHRONIC KIDNEY DISEASE  The various methods of treatment have been discussed with the patient and family. After consideration of risks, benefits and other options for treatment, the patient has consented to  Procedure(s): ARTERIOVENOUS (AV) GRAFT CREATION (Left) as a surgical intervention .  The patient's history has been reviewed, patient examined, no change in status, stable for surgery.  I have reviewed the patient's chart and labs.  Questions were answered to the patient's satisfaction.    Left upper extremity graft and nephrology has asked for tunneled dialysis catheter as well.  Patient updated on plan for left arm graft and TDC.   [place]  Reason for Consult: Permanent access  Requesting Physician: Dr. Moshe Cipro  MRN #: [id number]  History of Present Illness: This is a 67 y.o. female with PMH significant for HTN, CAD, CHF with CABG 01/2018 by Dr. Ricard Dillon. She was felt to be uremic on admission to hospital. Temporary catheter was placed in groin and patient had HD treatment 04/18/18. Kidney function improved and patient has been making good urine, thus temp cath was removed. She is seen in consultation for evaluation for permanent dialysis access. She is R arm dominant and would prefer placement in L arm. She is on IV heparin, normally warfarin at [facility] for PAF. Vein mapping has been performed.      Past Medical History:  Diagnosis Date  . Anemia    low iron  . Anxiety   . Arthritis   . Bronchitis   . CHF (congestive heart failure) ([place])   . CKD (chronic kidney disease) stage 4, GFR 15-29 ml/min (HCC) 03/16/2017  . Coronary artery disease   . Depression   . Diabetes mellitus without complication ([place])   . Diet-controlled diabetes mellitus ([place])   . Ganglion cyst    left wrist  . GERD (gastroesophageal reflux disease)   .  Headache    chronic headaches  . Heart failure ([place])   . Hyperlipidemia   . Hypertension   . Hypothyroidism   . Mitral regurgitation   . Myocardial infarction ([place])    3x last one 2008  . PAF (paroxysmal atrial fibrillation) ([place])   . Pneumonia    x 2  . S/P Maze operation for atrial fibrillation 02/18/2018   Complete bilateral atrial lesion set using bipolar radiofrequency and cryothermy ablation with clipping of LA appendage  . S/P mitral valve replacement with bioprosthetic valve 02/18/2018   [facility] Mitral stented bovine pericardial tissue valve Model 7300 TFX Serial # [id number] Size 29  . Stroke [facility])    no lasting residual - ? 2014  . Umbilical hernia         Past Surgical History:  Procedure Laterality Date  . ABDOMINAL HYSTERECTOMY     PARTIALS  . BIV PACEMAKER INSERTION CRT-P N/A 02/26/2018   Procedure: BIV PACEMAKER INSERTION CRT-P; Surgeon: Constance Haw, MD; Location: [place] [facility]; Service: Cardiovascular; Laterality: N/A;  . CARDIAC CATHETERIZATION    . CESAREAN SECTION    . CORONARY ARTERY BYPASS GRAFT N/A 02/18/2018   Procedure: CORONARY ARTERY BYPASS GRAFTING (CABG) WITH IMA. ENDOSCOPIC VEIN HARVEST. IMA TO LAD, SVG TO CIRC.; Surgeon: Rexene Alberts, MD; Location: [place]; Service: Open Heart Surgery; Laterality: N/A;  . INSERTION OF DIALYSIS CATHETER N/A 02/18/2018   Procedure: PLACEMENT OF TEMPORARY HD CATHETER, POWER TRIALYSIS  13FR 20CM; Surgeon: Rexene Alberts, MD; Location: [place]; Service: Vascular; Laterality: N/A;  . IR NEPHRO TUBE REMOV/FL  04/02/2017  . IR NEPHROSTOGRAM RIGHT THRU EXISTING ACCESS  04/02/2017  . IR NEPHROSTOMY PLACEMENT RIGHT  03/27/2017  . IR THORACENTESIS ASP PLEURAL SPACE W/IMG GUIDE  03/24/2017  . MAZE N/A 02/18/2018   Procedure: MAZE; Surgeon: Rexene Alberts, MD; Location: [place]; Service: Open Heart Surgery; Laterality: N/A;  . MITRAL VALVE REPLACEMENT N/A 02/18/2018   Procedure: MITRAL VALVE (MV) REPLACEMENT; Surgeon:  Rexene Alberts, MD; Location: [place]; Service: Open Heart Surgery; Laterality: N/A;  . MULTIPLE EXTRACTIONS WITH ALVEOLOPLASTY N/A 11/05/2017   Procedure: Extraction of tooth #'s 4,5,7,8,9,10,12,14 and 29 with alveoloplasty and gross debridement of remaining teeth; Surgeon: [name], DDS; Location: [place]; Service: Oral Surgery; Laterality: N/A;  . PATENT FORAMEN OVALE(PFO) CLOSURE N/A 02/18/2018   Procedure: PATENT FORAMEN OVALE (PFO) CLOSURE; Surgeon: Rexene Alberts, MD; Location: [place]; Service: Open Heart Surgery; Laterality: N/A;  . RIGHT/LEFT HEART CATH AND CORONARY ANGIOGRAPHY N/A 07/03/2017   Procedure: RIGHT/LEFT HEART CATH AND CORONARY ANGIOGRAPHY; Surgeon: Larey Dresser, MD; Location: [place] [facility]; Service: Cardiovascular; Laterality: N/A;  . TEE WITHOUT CARDIOVERSION N/A 10/08/2017   Procedure: TRANSESOPHAGEAL ECHOCARDIOGRAM (TEE); Surgeon: Larey Dresser, MD; Location: [facility] ENDOSCOPY; Service: Cardiovascular; Laterality: N/A;  . TEE WITHOUT CARDIOVERSION N/A 02/18/2018   Procedure: TRANSESOPHAGEAL ECHOCARDIOGRAM (TEE); Surgeon: Rexene Alberts, MD; Location: [place]; Service: Open Heart Surgery; Laterality: N/A;  . TONSILLECTOMY    . TUBAL LIGATION         Allergies  Allergen Reactions  . Ace Inhibitors Anaphylaxis and Swelling  . Motrin Ib [Ibuprofen] Anaphylaxis          Prior to Admission medications   Medication Sig Start Date End Date Taking? Authorizing Provider  acetaminophen (TYLENOL) 325 MG tablet Take 2 tablets (650 mg total) by mouth every 6 (six) hours as needed for mild pain, fever or headache. 04/23/17  Yes Allie Bossier, MD  albuterol (PROVENTIL HFA;VENTOLIN HFA) 108 (90 Base) MCG/ACT inhaler Inhale 2 puffs into the lungs every 6 (six) hours as needed for wheezing or shortness of breath.   Yes [provider]  ALPRAZolam (XANAX) 0.25 MG tablet Take 1 tablet (0.25 mg total) by mouth at bedtime as needed for anxiety. 04/16/18  Yes Georgiana Shore, NP  amLODipine (NORVASC) 10 MG tablet Take 1 tablet (10 mg total) by mouth daily. 04/24/17  Yes Allie Bossier, MD  aspirin EC 81 MG EC tablet Take 1 tablet (81 mg total) by mouth daily. 04/24/17  Yes Allie Bossier, MD  atorvastatin (LIPITOR) 80 MG tablet Take 1 tablet (80 mg total) by mouth daily. 03/06/18  Yes Barrett, Erin R, PA-C  b complex vitamins tablet Take 1 tablet by mouth daily.   Yes [provider]  buPROPion (WELLBUTRIN) 100 MG tablet Take 100 mg by mouth 3 (three) times daily.   Yes [provider]  busPIRone (BUSPAR) 15 MG tablet Take 1 tablet (15 mg total) by mouth 2 (two) times daily. 04/23/17  Yes Allie Bossier, MD  carvedilol (COREG) 6.25 MG tablet Take 6.25 mg by mouth 2 (two) times daily.   Yes [provider]  cholecalciferol (VITAMIN D) 1000 units tablet Take 1,000 Units by mouth daily.   Yes [provider]  doxazosin (CARDURA) 8 MG tablet Take 8 mg (1 tab) in am and 4 mg (1/2 tab) in pm  Patient taking differently: Take 4-8 mg by mouth See admin instructions. Take 1 tablet every morning and take 1/2 tablet every evening 03/11/18  Yes Clegg, Amy D, NP  DULoxetine (CYMBALTA) 30 MG capsule Take 1 capsule (30 mg total) by mouth daily. 04/24/17  Yes Allie Bossier, MD  gabapentin (NEURONTIN) 300 MG capsule Take 300 mg by mouth at bedtime. 10/02/17  Yes [provider]  hydrALAZINE (APRESOLINE) 100 MG tablet Take 1 tablet (100 mg total) by mouth every 8 (eight) hours. 03/05/18  Yes Barrett, Erin R, PA-C  isosorbide mononitrate (IMDUR) 120 MG 24 hr tablet Take 1 tablet (120 mg total) by mouth daily. 04/24/17  Yes Allie Bossier, MD  levothyroxine (SYNTHROID, LEVOTHROID) 112 MCG tablet Take 112 mcg by mouth daily before breakfast.   Yes [provider]  montelukast (SINGULAIR) 10 MG tablet Take 1 tablet (10 mg total) by mouth daily. 04/24/17  Yes Allie Bossier, MD  Nutritional Supplements (CARNATION BREAKFAST  ESSENTIALS PO) Take 237 mLs by mouth 2 (two) times daily.   Yes [provider]  oxyCODONE (OXY IR/ROXICODONE) 5 MG immediate release tablet Take 1 tablet (5 mg total) by mouth every 4 (four) hours as needed for severe pain. 03/05/18  Yes Barrett, Erin R, PA-C  pantoprazole (PROTONIX) 40 MG tablet Take 1 tablet (40 mg total) by mouth 2 (two) times daily before a meal. 04/23/17  Yes Allie Bossier, MD  potassium chloride SA (K-DUR,KLOR-CON) 20 MEQ tablet Take 20 mEq by mouth daily.   Yes [provider]  torsemide (DEMADEX) 20 MG tablet Take 100 mg in am and 60 mg in pm  Patient taking differently: Take 60-100 mg by mouth See admin instructions. Take 5 tablets every morning then take 3 tablets every evening 04/02/18  Yes Larey Dresser, MD  traZODone (DESYREL) 25 mg TABS tablet Take 25 mg by mouth at bedtime.   Yes [provider]  warfarin (COUMADIN) 2.5 MG tablet Take 1 tablet (2.5 mg total) by mouth daily at 6 PM. 03/05/18  Yes Barrett, Erin R, PA-C  metolazone (ZAROXOLYN) 2.5 MG tablet Take 1 tablet (2.5 mg total) by mouth 2 (two) times a week. 04/19/18 07/18/18  Georgiana Shore, NP  ondansetron (ZOFRAN) 4 MG tablet Take 1 tablet (4 mg total) by mouth every 8 (eight) hours as needed for nausea or vomiting.  Patient not taking: Reported on 04/17/2018 04/23/17 04/23/18  Allie Bossier, MD   Social History        Socioeconomic History  . Marital status: Divorced    Spouse name: Not on file  . Number of children: Not on file  . Years of education: Not on file  . Highest education level: Not on file  Occupational History  . Not on file  Social Needs  . Financial resource strain: Not on file  . Food insecurity:    Worry: Not on file    Inability: Not on file  . Transportation needs:    Medical: Not on file    Non-medical: Not on file  Tobacco Use  . Smoking status: Former Smoker    Types: Cigarettes  . Smokeless tobacco: Never Used  Substance and Sexual  Activity  . Alcohol use: No  . Drug use: No    Comment: marijuana in the past  . Sexual activity: Not Currently  Lifestyle  . Physical activity:    Days per week: Not on file    Minutes per session: Not on  file  . Stress: Not on file  Relationships  . Social connections:    Talks on phone: Not on file    Gets together: Not on file    Attends religious service: Not on file    Active member of club or organization: Not on file    Attends meetings of clubs or organizations: Not on file    Relationship status: Not on file  . Intimate partner violence:    Fear of current or ex partner: Not on file    Emotionally abused: Not on file    Physically abused: Not on file    Forced sexual activity: Not on file  Other Topics Concern  . Not on file  Social History Narrative  . Not on file        Family History  Problem Relation Age of Onset  . Hypertension Mother    ROS: Otherwise negative unless mentioned in HPI  Physical Examination      Vitals:   04/20/18 2121 04/21/18 0426  BP: (!) 146/82 (!) 157/88  Pulse:  69  Resp:  18  Temp:  98.4 F (36.9 C)  SpO2:  100%   Body mass index is 18.41 kg/m.  General: WDWN in NAD  Gait: Not observed  HENT: WNL, normocephalic  Pulmonary: normal non-labored breathing  Cardiac: regular with ectopy  Abdomen: soft, NT/ND, no masses  Skin: without rashes  Vascular Exam/Pulses: symmetrical radial pulses; feet symmetrically warm to touch with good refill  Extremities: without ischemic changes, without Gangrene , without cellulitis; without open wounds  Musculoskeletal: no muscle wasting or atrophy  Neurologic: A&O X 3; No focal weakness or paresthesias are detected; speech is fluent/normal; some R hand numbness  Psychiatric: The pt has Normal affect.  Lymph: Unremarkable  CBC  Labs (Brief)                                                                                               BMET  Labs (Brief)                                                                                          COAGS:  Recent Labs                                   Non-Invasive Vascular Imaging:  Bilateral upper extremity vein mapping completed.  ASSESSMENT/PLAN: This is a 67 y.o. female with progressing CKD  Vein map performed; L basilic is only marginal for conduit use  Continue to restrict L arm for now  Plan will be for L arm AV fistula versus graft  Nephrology will notify us if [facility] is also required at time of access  surgery  Dr. Donnetta Hutching will evaluate the patient later today and provide further recommendations  Dagoberto Ligas PA-C  [facility]  [phone]  I have examined the patient, reviewed and agree with above. Discussed options regarding the patient's hemodialysis needs. Fortunately she is currently making urine and her temporary catheter is been removed. I discussed tunneled catheter, AV fistula and AV graft. By physical exam and by vein map she has a very small surface veins. She has marginal right upper arm basilic vein. I discussed options of left arm graft versus attempted fistula on the right. I think that she would be best served with a left arm graft as her initial dialysis. We will plan this for Friday if nephrology feels it is appropriate from a timing standpoint. Will not plan catheter and less felt as needed by nephrology  Curt Jews, MD  04/21/2018  8:38 PM

## 2018-04-23 NOTE — Anesthesia Preprocedure Evaluation (Addendum)
Anesthesia Evaluation  Patient identified by MRN, date of birth, ID band Patient awake    Reviewed: Allergy & Precautions, NPO status , Patient's Chart, lab work & pertinent test results  Airway Mallampati: II  TM Distance: >3 FB Neck ROM: Full    Dental  (+) Poor Dentition, Dental Advisory Given   Pulmonary former smoker,     + decreased breath sounds      Cardiovascular hypertension, + CAD, + Past MI, + CABG and +CHF  + dysrhythmias Atrial Fibrillation + pacemaker  Rhythm:Regular Rate:Normal     Neuro/Psych  Headaches, Anxiety Depression CVA    GI/Hepatic Neg liver ROS, GERD  ,  Endo/Other  diabetesHypothyroidism   Renal/GU CRFRenal disease     Musculoskeletal  (+) Arthritis , Osteoarthritis,    Abdominal Normal abdominal exam  (+)   Peds  Hematology negative hematology ROS (+)   Anesthesia Other Findings - HLD  Reproductive/Obstetrics                            Lab Results  Component Value Date   WBC 7.6 04/23/2018   HGB 9.3 (L) 04/23/2018   HCT 28.5 (L) 04/23/2018   MCV 73.8 (L) 04/23/2018   PLT 301 04/23/2018   Lab Results  Component Value Date   CREATININE 6.38 (H) 04/23/2018   BUN 41 (H) 04/23/2018   NA 137 04/23/2018   K 3.2 (L) 04/23/2018   CL 97 (L) 04/23/2018   CO2 28 04/23/2018   Lab Results  Component Value Date   INR 1.29 04/23/2018   INR 1.26 04/22/2018   INR 1.32 04/21/2018   Echo: - Left ventricle: The cavity size was normal. There was severe concentric hypertrophy. Systolic function was moderately to severely reduced. The estimated ejection fraction was in the range of 30% to 35%. Features are consistent with a pseudonormal   left ventricular filling pattern, with concomitant abnormal   relaxation and increased filling pressure (grade 2 diastolic   dysfunction). Doppler parameters are consistent with elevated   ventricular end-diastolic filling pressure. -  Mitral valve: S/P MVR with a bioprosthetic valve. Mean gradient   (D): 5 mm Hg. Valve area by continuity equation (using LVOT   flow): 1.06 cm^2. - Right ventricle: Systolic function was normal. - Right atrium: The atrium was normal in size. - Pulmonic valve: There was mild regurgitation. - Pulmonary arteries: Systolic pressure was within the normal   range. - Pericardium, extracardiac: A trivial pericardial effusion was   identified posterior to the heart. Features were not consistent   with tamponade physiology. There was a large left pleural   effusion.  Anesthesia Physical Anesthesia Plan  ASA: IV  Anesthesia Plan: MAC   Post-op Pain Management:    Induction: Intravenous  PONV Risk Score and Plan: Propofol infusion and Ondansetron  Airway Management Planned: Natural Airway and Simple Face Mask  Additional Equipment: None  Intra-op Plan:   Post-operative Plan: Extubation in OR  Informed Consent: I have reviewed the patients History and Physical, chart, labs and discussed the procedure including the risks, benefits and alternatives for the proposed anesthesia with the patient or authorized representative who has indicated his/her understanding and acceptance.   Dental advisory given  Plan Discussed with: CRNA  Anesthesia Plan Comments: (Consented for GA as well. Pt complaining of chest pain but states the pain is always present and worsens with stress. She notes the pain is not worse with strenuous activity (  i.e. climbing stairs). She is "stressed" about procedure but would like to proceed.    )      Anesthesia Quick Evaluation

## 2018-04-23 NOTE — Telephone Encounter (Signed)
sch appt lvm mld ltr 05/17/18 1pm p/o PA

## 2018-04-23 NOTE — Progress Notes (Addendum)
Family Medicine Teaching Service Daily Progress Note Intern Pager: [phone]  Patient name: Jocelyn Hill Medical record number: [id number] Date of birth: [birth year] Age: 67 y.o. Gender: female  Primary Care Provider: [name], MD Consultants: Cardiology, Nephrology, VVS Code Status: Full   Pt Overview and Major Events to Date:  04/17/18 - Admitted to [facility]  04/18/18 - Echo EF 30-35% 04/19/18 - Temporary HD cath placed  04/20/18 - Temp cath removed 04/23/18 - L arm graft and TDC   Assessment and Plan: Jocelyn Hill a 67 y.o.femalepresenting from cardiology officewith elevated creatinine and chest pain. PMH is significant forCHF, CKD IV, HTN,CAD s/p MI and CABG (01/2018),hypothyroidism, paroxysmalA-fibs/p MAZE (01/2018),mitral regurgitation s/p mitral valve replacement (01/2018), microcytic anemia, anxiety/depression.  HFrEF exacerbation: Patient transitioned to PO lasix yesterday. Placement of left arm graft and TDC planned for this monring.  24-hr UOP 0.9L while on lasix. Wt down from yesterday (56.6>55.3>54.9kg). Wt on admission 65.2kg. SOB improved. Only trace LE edema bilaterally. CBC unchanged. Will ambulate patient with pulse ox to determine need for O2 as patient has been on 2L [place] since admission. - VVS, cardiology and nephrology following - Placement of left arm graft + TDC, will follow up - Hold 160mg  PO lasix BID until s/p procedure - Daily weights/Strict I&O's - Continue fluid restriction  - Hold coreg, hydralazine, and Imdur  - PT/OT recommend SNF, rolling walker w/ 5" wheels and 3n1 - NPO until s/p procedure - SNF bed at [facility] approved and pending medical clearance - PT consulted for ambulation with pulse ox  - Follow-up PT/INR, CBC, and renal function  CKD-V: As above, pt on PO lasix overnight. Placement of left arm graft + TDC planned for this morning.  24-hr UOP 0.9L. Creatinine and BUN slightly worsened overnight Cr 5.74>6.38, BUN 40>41.  K+ 3.4>3.2. Phos 5.0>4.9. Mag WNL. Trace LE edema and only fine crackles on RLL on lung exam. Will follow up nephro recs concerning HD plan and further management - Nephrology and VVS following, will follow up with plan going forward - Placement of left arm graft + TDC, will follow-up - Tylenol PRN pain - follow renal function labs - zofran as needed nausea  Atypical ChestPain: Chronic, Stable.. S/p CABG with LIMA-LAD and SVG-LCx on 01/2018. Patient has history of CAD s/p MI in August 2019 with CABG and biventricular pacemaker in place 2/2 to complete heart block. Troponins with low flat trend, per cardiology not c/w ACS. Likely demand ischemia in setting of acute CHF exacerbation and anemia and worsening renal function . Echo 30-35%, G2DD. Patient has chronic atypical chest pain over pacemaker site. Controlled with Oxy.  - Continuous cardiac monitor  - Contiuous pulse ox with O2 PRN - Continue Aspirin 81mg  - Continue Oxy 5mg  IR q12 hours PRN pain  - Continue home Atorvastatin, Coreg, doxazosin, hydralazine, Imdur - Hold home warfarin, on IV Heparin for potential cath placement - pharmacy managing   - Pending outcome of AVF/TDC, will need to consider anticoag plan prior to discharge  HTN: BP improved with increases in Coreg and Doxazosin. AM BP of 143/77. On home hydralazine, amlodipine, Coreg, doxazosin. Per nephro, Amlodipine discontinued to avoid low BP with continued diuresis. Will consult nephro in regards to adjustments to BP regimen.   - Hold Coreg 25mg , Doxazosin 8mg  and hydralazine until s/p procedure - Stop Amlodipine - Continue to monitor  History of Mitral regurgitation s/p mitral valve replacement Chronic, stable. 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 Biprosthetic MV replacement in August  2019. Post-op echo was stable. Echo unchanged in regards to MV.  Atrial Fibrillation s/p MAZE: Chronic, stable.  MAZE procedure in August 2019. No recurrence since.  - Hold warfarin, on IV  heparin for HD potential AVF/TDC placement  Microcytic Anemia: chroic, stable Currently stable at 9.3. Will continue to monitor. Will follow-up neuro recs concerning electrolyte management as pt likely to start HD soon -Ferric gluconate 125mg  qMWF with HD, ESA  Hypothyroidism:  TSH on 10/26/17: 2.884. On Synthroid. - Hold home synthroid until s/p procedure  GERD: - Hold protonix 40mg  BID  Anxiety/Depression: Chronic, stable.  On home Wellbutrin, Cymbalta, Xanax PRN, and Trazodone. Cymbalta decreased to 20mg  due to BEERs criteria. Patient tolerating well. Denies HI/SI. - Hold Cymbalta 20mg , Wellbutrin and Trazodone - Home Xanax PRN qHS anxiety   FEN/GI: - NPO until s/p procedure, Miralax Prophylaxis: Warfarin  Disposition: continued inpatient stay, ultimately will d/c to SNF  Subjective:  Abdominal pain improved. Some recurrent pain at pacemaker site, but oxy helps. Did well overnight. Still has not had a BM. Denies any nausea, vomiting. Patient ahs been NPO for preparation of procedure. Ambulating without problem.  Objective: Temp:  [97.2 F (36.2 C)-98.3 F (36.8 C)] 97.2 F (36.2 C) (10/25 1347) Pulse Rate:  [60-63] 60 (10/25 0621) Resp:  [16-20] 16 (10/25 0621) BP: (141-152)/(70-77) 141/70 (10/25 0750) SpO2:  [100 %] 100 % (10/25 0750) Weight:  [54.9 kg] 54.9 kg (10/25 0820)  Physical Exam:  Gen: NAD, alert, non-toxic, well-appearing, lying in bed  Skin: Warm and dry. No obvious rashes, lesions, or trauma. HEENT: NCAT No conjunctival pallor or injection. No scleral icterus or injection.  MMM.  CV: RRR. +JVD.  <2s capillary refill bilaterally.  RP & DPs 2+ bilaterally. Trace LE edema bilaterally,  Resp: CTAB. Fine crackles at base of right lung. No increased WOB Abd: Abdomen soft, NTTP. Positive bowel sounds. Psych: Cooperative with exam. Pleasant. Makes eye contact. Speech normal. Extremities: Moves all extremities spontaneously   Laboratory: Recent Labs  Lab  04/21/18 0408 04/22/18 0431 04/23/18 0505  WBC 7.5 6.7 7.6  HGB 9.0* 9.1* 9.3*  HCT 27.6* 29.1* 28.5*  PLT 279 265 301   Recent Labs  Lab 04/16/18 1805  04/21/18 0408 04/22/18 0431 04/23/18 0505  NA 136   < > 137 137 137  K 4.7   < > 3.4* 3.4* 3.2*  CL 100   < > 100 100 97*  CO2 21*   < > 26 25 28   BUN 59*   < > 37* 40* 41*  CREATININE 6.03*   < > 5.48* 5.74* 6.38*  CALCIUM 9.4   < > 9.8 10.0 10.3  PROT 6.3*  --   --   --   --   BILITOT 0.5  --   --   --   --   ALKPHOS 76  --   --   --   --   ALT 15  --   --   --   --   AST 16  --   --   --   --   GLUCOSE 229*   < > 100* 106* 103*   < > = values in this interval not displayed.    Ref. Range 04/18/2018 02:29  Iron Latest Ref Range: 28 - 170 ug/dL 18 (L)  UIBC Latest Units: ug/dL 206  TIBC Latest Ref Range: 250 - 450 ug/dL 224 (L)  Saturation Ratios Latest Ref Range: 10.4 - 31.8 % 8 (L)  Imaging/Diagnostic Tests: Dg Chest 2 View  Result Date: 04/16/2018 CLINICAL DATA:  67 y/o  F; chest pain. EXAM: CHEST - 2 VIEW COMPARISON:  03/16/2018 chest radiograph. FINDINGS: Stable cardiomegaly given projection and technique. Mitral valve replacement and PFO closure. Post CABG with sternotomy wires in alignment. Stable 3 lead pacemaker. Small bilateral pleural effusions and chronic pulmonary vascular congestion. No consolidation or pneumothorax. Calcific aortic atherosclerosis. Bones are unremarkable. IMPRESSION: Stable small bilateral pleural effusions, pulmonary vascular congestion, and cardiomegaly. Electronically Signed   By: Kristine Garbe M.D.   On: 04/16/2018 23:36   Mina Marble, DO PGY-1, [facility] 04/23/2018 2:25 PM [place] Intern pager: [phone], text pages welcome

## 2018-04-23 NOTE — Progress Notes (Signed)
[place] for heparin > warfarin Indication: atrial fibrillation  Allergies  Allergen Reactions  . Ace Inhibitors Anaphylaxis and Swelling  . Motrin Ib [Ibuprofen] Anaphylaxis    Patient Measurements: Height: 5\' 9"  (175.3 cm) Weight: 121 lb 1.6 oz (54.9 kg) IBW/kg (Calculated) : 66.2 Heparin Dosing Weight: 65.2 kg   Vital Signs: Temp: 97.5 F (36.4 C) (10/25 1441) Temp Source: Oral (10/25 0750) BP: 149/72 (10/25 1432) Pulse Rate: 63 (10/25 1441)  Labs: Recent Labs    04/21/18 0408 04/22/18 0431 04/22/18 0732 04/23/18 0505 04/23/18 0719  HGB 9.0* 9.1*  --  9.3*  --   HCT 27.6* 29.1*  --  28.5*  --   PLT 279 265  --  301  --   LABPROT 16.2*  --  15.7*  --  15.9*  INR 1.32  --  1.26  --  1.29  HEPARINUNFRC 0.56 0.50  --  0.51  --   CREATININE 5.48* 5.74*  --  6.38*  --     Estimated Creatinine Clearance: 7.5 mL/min (A) (by C-G formula based on SCr of 6.38 mg/dL (H)).   Medical History: Past Medical History:  Diagnosis Date  . Anemia    low iron  . Anxiety   . Arthritis   . Bronchitis   . CHF (congestive heart failure) ([place])   . CKD (chronic kidney disease) stage 4, GFR 15-29 ml/min (HCC) 03/16/2017  . Coronary artery disease   . Depression   . Diabetes mellitus without complication ([place])   . Diet-controlled diabetes mellitus ([place])   . Ganglion cyst    left wrist  . GERD (gastroesophageal reflux disease)   . Headache    chronic headaches  . Heart failure ([place])   . Hyperlipidemia   . Hypertension   . Hypothyroidism   . Mitral regurgitation   . Myocardial infarction ([place])    3x last one 2008  . PAF (paroxysmal atrial fibrillation) ([place])   . Pneumonia    x 2  . S/P Maze operation for atrial fibrillation 02/18/2018   Complete bilateral atrial lesion set using bipolar radiofrequency and cryothermy ablation with clipping of LA appendage  . S/P mitral valve replacement with bioprosthetic valve 02/18/2018   [facility] Mitral stented bovine pericardial tissue valve Model 7300 TFX Serial # [id number] Size 29  . Stroke [facility])    no lasting residual - ? 2014  . Umbilical hernia     Medications:  Scheduled:  . aspirin EC  81 mg Oral Daily  . atorvastatin  80 mg Oral Daily  . B-complex with vitamin C  1 tablet Oral Daily  . buPROPion  100 mg Oral TID  . busPIRone  15 mg Oral BID  . carvedilol  25 mg Oral BID  . Chlorhexidine Gluconate Cloth  6 each Topical Q0600  . Chlorhexidine Gluconate Cloth  6 each Topical Q0600  . [START ON 04/24/2018] Chlorhexidine Gluconate Cloth  6 each Topical Q0600  . cholecalciferol  1,000 Units Oral Daily  . darbepoetin (ARANESP) injection - NON-DIALYSIS  60 mcg Subcutaneous Q Wed-1800  . doxazosin  8 mg Oral Daily   And  . doxazosin  4 mg Oral QHS  . DULoxetine  20 mg Oral Daily  . furosemide  160 mg Oral BID  . hydrALAZINE  100 mg Oral Q8H  . isosorbide mononitrate  120 mg Oral Daily  . levothyroxine  112 mcg Oral QAC breakfast  . montelukast  10 mg Oral  Daily  . pantoprazole  40 mg Oral BID AC  . polyethylene glycol  17 g Oral Daily  . traZODone  25 mg Oral QHS    Assessment: 32 yoF on warfarin PTA for Afib - was on hold  planning to undergo procedures. Pt reported home regimen as 2.5 mg daily (last dose 10/17 PM).  S/P AVG and temp HD access today - oozing from site - spoke to VVS - hold heparin for now OK to restart warfarin - no stroke in HPI - does not need to bridge. CBC low stable  Goal of Therapy:  INR 2-3 Heparin level 0.3-0.7 units/ml Monitor platelets by anticoagulation protocol: Yes   Plan:  Warfarin 4mg  x1 tonight Daily INR, CBC Monitor  for s/sx of bleeding   Bonnita Nasuti Pharm.D. CPP, BCPS Clinical Pharmacist [phone] 04/23/2018 3:46 PM

## 2018-04-23 NOTE — Progress Notes (Signed)
Subjective:   had 900 of UOP recorded.    Kidney numbers did worsen again from yesterday - BP is better.  Seen post op - in pain   Objective Vital signs in last 24 hours: Vitals:   04/23/18 1417 04/23/18 1430 04/23/18 1432 04/23/18 1441  BP: 133/72  (!) 149/72   Pulse: 62 63 63 63  Resp: 14 (!) 21 19 (!) 22  Temp:    (!) 97.5 F (36.4 C)  TempSrc:      SpO2: 96% 96% 96% 96%  Weight:      Height:       Weight change:   Intake/Output Summary (Last 24 hours) at 04/23/2018 1525 Last data filed at 04/23/2018 1445 Gross per 24 hour  Intake 1161.93 ml  Output 270 ml  Net 891.93 ml    Assessment/ Plan: Pt is a 67 y.o. yo female with advanced CKD at baseline who was admitted on 04/16/2018 with what appeared to be uremic symptoms with BUN in the high 50's  Assessment/Plan: 1. Renal- felt to be uremic but BUN only 50's- s/p temp cath and HD on 10/20- was planning for another treatment 10/21 however,  was making good urine and BUN only 26- I decided to observe, numbers have  worsened since then but clinically is improved. I think is feeling better due to having volume status improved - have removed femoral temp cath.  Definitely think she is at risk of needing dialysis again soon given trend- crt nearly 7.  Now s/p AVG and TDC as well for [place] just go ahead and initiate HD.  I did speak with daughter [name] today regarding all of this- plan on HD tomorrow , being clipped  2. Anemia- hgb trending up without transfusion- 9.0 today - on iron, have added ESA   3. Secondary hyperparathyroidism- has been on nutritional vit D only - phos OK , pth 128 4. HTN/volume- BP high- as OP on amlodipine, coreg, cardura, hydralazine and high dose diuretics - BP much better as volume status is better.  Have changed lasix to PO and stopped norvasc so BP does not get too low 5. Dispo- has access - once clipped to OP unit will be able to be discharged from my standpoint   [place]: Basic  Metabolic Panel: Recent Labs  Lab 04/21/18 0408 04/22/18 0431 04/23/18 0505  NA 137 137 137  K 3.4* 3.4* 3.2*  CL 100 100 97*  CO2 26 25 28   GLUCOSE 100* 106* 103*  BUN 37* 40* 41*  CREATININE 5.48* 5.74* 6.38*  CALCIUM 9.8 10.0 10.3  PHOS 5.0* 5.0* 4.9*   Liver Function Tests: Recent Labs  Lab 04/16/18 1805  04/21/18 0408 04/22/18 0431 04/23/18 0505  AST 16  --   --   --   --   ALT 15  --   --   --   --   ALKPHOS 76  --   --   --   --   BILITOT 0.5  --   --   --   --   PROT 6.3*  --   --   --   --   ALBUMIN 3.3*   < > 3.1* 3.3* 3.3*   < > = values in this interval not displayed.   No results for input(s): LIPASE, AMYLASE in the last 168 hours. No results for input(s): AMMONIA in the last 168 hours. CBC: Recent Labs  Lab 04/19/18 0651 04/20/18 0426  04/21/18 0408 04/22/18 0431 04/23/18 0505  WBC 7.1 8.2 7.5 6.7 7.6  HGB 9.7* 9.3* 9.0* 9.1* 9.3*  HCT 31.1* 28.7* 27.6* 29.1* 28.5*  MCV 75.3* 74.5* 74.6* 75.8* 73.8*  PLT 292 298 279 265 301   Cardiac Enzymes: Recent Labs  Lab 04/16/18 1805 04/16/18 2335 04/17/18 0320 04/17/18 0942 04/17/18 1416  TROPONINI 0.07* 0.07* 0.07* 0.07* 0.06*   CBG: Recent Labs  Lab 04/19/18 1412 04/23/18 1350  GLUCAP 97 138*    Iron Studies:  No results for input(s): IRON, TIBC, TRANSFERRIN, FERRITIN in the last 72 hours. Studies/Results: Dg Chest Port 1 View  Result Date: 04/23/2018 CLINICAL DATA:  Dialysis catheter insertion. EXAM: PORTABLE CHEST 1 VIEW COMPARISON:  04/16/2018. FINDINGS: Dual-lumen right IJ catheter noted with tip over cavoatrial junction. AICD in stable position. Prior cardiac valve replacement. Left atrial appendage clip. Prior CABG. Cardiomegaly. Pulmonary vascular prominence and bilateral interstitial prominence. Left-sided pleural effusion. Findings suggest CHF. IMPRESSION: 1.  Dual-lumen right IJ catheter with tip at cavoatrial junction. 2. AICD in stable position. Prior CABG and cardiac valve  replacement. Cardiomegaly with bilateral pulmonary interstitial prominence and left-sided pleural effusion consistent with CHF noted on today's exam. Electronically Signed   By: [place]   On: 04/23/2018 14:29   Medications: Infusions: . sodium chloride 10 mL/hr at 04/23/18 1002  . ceFAZolin    . ferric gluconate (FERRLECIT/NULECIT) IV Stopped (04/21/18 1900)  . heparin 1,150 Units/hr (04/22/18 1930)    Scheduled Medications: . aspirin EC  81 mg Oral Daily  . atorvastatin  80 mg Oral Daily  . B-complex with vitamin C  1 tablet Oral Daily  . buPROPion  100 mg Oral TID  . busPIRone  15 mg Oral BID  . carvedilol  25 mg Oral BID  . Chlorhexidine Gluconate Cloth  6 each Topical Q0600  . Chlorhexidine Gluconate Cloth  6 each Topical Q0600  . cholecalciferol  1,000 Units Oral Daily  . darbepoetin (ARANESP) injection - NON-DIALYSIS  60 mcg Subcutaneous Q Wed-1800  . doxazosin  8 mg Oral Daily   And  . doxazosin  4 mg Oral QHS  . DULoxetine  20 mg Oral Daily  . furosemide  160 mg Oral BID  . hydrALAZINE  100 mg Oral Q8H  . isosorbide mononitrate  120 mg Oral Daily  . levothyroxine  112 mcg Oral QAC breakfast  . montelukast  10 mg Oral Daily  . pantoprazole  40 mg Oral BID AC  . polyethylene glycol  17 g Oral Daily  . traZODone  25 mg Oral QHS    have reviewed scheduled and prn medications.  Physical Exam: General: flat, - difficult to read- does not appear uremic Heart: RRR Lungs: mostly clear- dec BS at extreme bases Abdomen: distended but better Extremities:  Really no periph edema Dialysis Access: none   04/23/2018,3:25 PM  LOS: 6 days

## 2018-04-23 NOTE — Anesthesia Postprocedure Evaluation (Signed)
Anesthesia Post Note  Patient: TRINKA KESHISHYAN  Procedure(s) Performed: LEFT ARTERIOVENOUS (AV) GRAFT CREATION (Left Arm Upper) INSERTION OF DIALYSIS CATHETER (Right Chest)     Patient location during evaluation: PACU Anesthesia Type: MAC Level of consciousness: awake and alert Pain management: pain level controlled Vital Signs Assessment: post-procedure vital signs reviewed and stable Respiratory status: spontaneous breathing, nonlabored ventilation, respiratory function stable and patient connected to nasal cannula oxygen Cardiovascular status: stable and blood pressure returned to baseline Postop Assessment: no apparent nausea or vomiting Anesthetic complications: no Comments: Pt states chest pain is greatly improved.     Last Vitals:  Vitals:   04/23/18 1430 04/23/18 1432  BP:  (!) 149/72  Pulse: 63 63  Resp: (!) 21 19  Temp:    SpO2: 96% 96%    Last Pain:  Vitals:   04/23/18 0750  TempSrc: Oral  PainSc:                  Jocelyn Hill

## 2018-04-23 NOTE — Progress Notes (Addendum)
Advanced Heart Failure Rounding Note  PCP-Cardiologist: [name], MD   Subjective:   Yesterday amlodipine stopped and she continued to diurese with high dose lasix. Weight down another  2 pounds. Creatinine up to 6.4.   Plan for AVF today.   Worried abut surgery. Denies SOB.     Objective:   Weight Range: 55.3 kg Body mass index is 18.02 kg/m.   Vital Signs:   Temp:  [97.8 F (36.6 C)-98.3 F (36.8 C)] 97.8 F (36.6 C) (10/25 0750) Pulse Rate:  [60-63] 60 (10/25 0621) Resp:  [16-20] 16 (10/25 0621) BP: (141-152)/(70-80) 141/70 (10/25 0750) SpO2:  [100 %] 100 % (10/25 0750) Last BM Date: 04/20/18  Weight change: Filed Weights   04/20/18 0613 04/21/18 0426 04/22/18 0519  Weight: 58.7 kg 56.6 kg 55.3 kg    Intake/Output:   Intake/Output Summary (Last 24 hours) at 04/23/2018 0811 Last data filed at 04/23/2018 0806 Gross per 24 hour  Intake 1004.71 ml  Output 1100 ml  Net -95.29 ml      Physical Exam  General:  Thin appears chronically ill. No resp difficulty. IN bed.  HEENT: normal Neck: supple. JVP 9-10 . Carotids 2+ bilat; no bruits. No lymphadenopathy or thryomegaly appreciated. Cor: PMI nondisplaced. Regular rate & rhythm. No rubs, gallops or murmurs. Lungs: clear Abdomen: soft, nontender, nondistended. No hepatosplenomegaly. No bruits or masses. Good bowel sounds. Extremities: no cyanosis, clubbing, rash, edema Neuro: alert & orientedx3, cranial nerves grossly intact. moves all 4 extremities w/o difficulty. Affect pleasant    Telemetry   BiV paced 60s personally reviewed.   EKG    No new tracings.   Labs    CBC Recent Labs    04/22/18 0431 04/23/18 0505  WBC 6.7 7.6  HGB 9.1* 9.3*  HCT 29.1* 28.5*  MCV 75.8* 73.8*  PLT 265 622   Basic Metabolic Panel Recent Labs    04/22/18 0431 04/22/18 0732 04/23/18 0505  NA 137  --  137  K 3.4*  --  3.2*  CL 100  --  97*  CO2 25  --  28  GLUCOSE 106*  --  103*  BUN 40*  --  41*    CREATININE 5.74*  --  6.38*  CALCIUM 10.0  --  10.3  MG  --  2.0  --   PHOS 5.0*  --  4.9*   Liver Function Tests Recent Labs    04/22/18 0431 04/23/18 0505  ALBUMIN 3.3* 3.3*   No results for input(s): LIPASE, AMYLASE in the last 72 hours. Cardiac Enzymes No results for input(s): CKTOTAL, CKMB, CKMBINDEX, TROPONINI in the last 72 hours.  BNP: BNP (last 3 results) Recent Labs    02/17/18 0353 03/11/18 1458 04/16/18 1805  BNP 675.7* 1,452.6* 2,442.0*    ProBNP (last 3 results) No results for input(s): PROBNP in the last 8760 hours.   D-Dimer No results for input(s): DDIMER in the last 72 hours. Hemoglobin A1C No results for input(s): HGBA1C in the last 72 hours. Fasting Lipid Panel No results for input(s): CHOL, HDL, LDLCALC, TRIG, CHOLHDL, LDLDIRECT in the last 72 hours. Thyroid Function Tests No results for input(s): TSH, T4TOTAL, T3FREE, THYROIDAB in the last 72 hours.  Invalid input(s): FREET3  Other results:   Imaging    No results found.   Medications:     Scheduled Medications: . aspirin EC  81 mg Oral Daily  . atorvastatin  80 mg Oral Daily  . B-complex with vitamin C  1 tablet Oral Daily  . buPROPion  100 mg Oral TID  . busPIRone  15 mg Oral BID  . carvedilol  25 mg Oral BID  . Chlorhexidine Gluconate Cloth  6 each Topical Q0600  . Chlorhexidine Gluconate Cloth  6 each Topical Q0600  . cholecalciferol  1,000 Units Oral Daily  . darbepoetin (ARANESP) injection - NON-DIALYSIS  60 mcg Subcutaneous Q Wed-1800  . doxazosin  8 mg Oral Daily   And  . doxazosin  4 mg Oral QHS  . DULoxetine  20 mg Oral Daily  . furosemide  160 mg Oral BID  . hydrALAZINE  100 mg Oral Q8H  . isosorbide mononitrate  120 mg Oral Daily  . levothyroxine  112 mcg Oral QAC breakfast  . montelukast  10 mg Oral Daily  . pantoprazole  40 mg Oral BID AC  . traZODone  25 mg Oral QHS    Infusions: . ferric gluconate (FERRLECIT/NULECIT) IV Stopped (04/21/18 1900)  .  heparin 1,150 Units/hr (04/22/18 1930)    PRN Medications: acetaminophen, albuterol, ALPRAZolam, ondansetron (ZOFRAN) IV, oxyCODONE    Patient Profile   Jocelyn Hill. Broweris a67 y.o.femalewith h/o mitral regurgitation s/p bioprosthetic MVR 11/2945, chronic diastolic CHF, CAD s/p CABG with LIMA-LAD and SVG-LCx 01/2018, long standing HTN, DM2, CKD IV-V, h/o Stroke, anxiety, depression, GERD. Admitted in 8/19 for Bioprosthetic MVR, Maze, LA appendage clip, PFO closure, and CABG with LIMA-LAD and SVG-LCx. This was complicated by AKI and complete heart block. St Jude CRT-P device placed. Echo in 8/19 post-op showed EF 45-50%, stable bioprosthetic mitral valve.   She was sent to ED 04/16/18 with A/C HF and creatinine 6.0.   Assessment/Plan   1. Acute on chronic combined CHF: Echo (8/19, post-op) with EF 45-50%. Has St Jude CRT-P device due to post-op complete heart block.  - Echo this admission with lower EF 30-35%. - Volume status improving. Overall weight down 22 pounds. Starting high dose oral lasix today.  - Creatinine up to 6.4 BUN 41.  - Continue coreg 25 mg BID - Continue hydralazine 100 mg q8 and imdur 120 mg daily - No spiro with CKD IV - No ACE with hx of angioedema/CKD IV 2. CKD Stage IV: Had R hydronephrosis in setting of hydronephrosis and required percutaneous drain in 9/18 (removed). Renal US 04/14/17 showed no further evidence of hydronephrosis. - Creatinine peaked at 6.17.  - Had HD with 2 L off 10/20.  - Starting oral lasix today.  Creatinine up to 6.4  - Coumadin on hold. Remains on heparin for tunneled HD cath +/- AVF. Vein mapping completed. Planning for perm access today. Per renal.  3. HTN: SBP ok. Off amlodipine to allow high BP. May need to cut back doxazosin next. .  - Continue hydralazine 100 mg tid.  --Continue coreg 25 mg BID.  4. 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 Had bioprosthetic MVR in 8/19. Post-op echo in 8/19 showed stable  bioprosthetic mitral valve. Echo this admission showed mean gradient 5 mm Hg. No change. 5. Atrial fibrillation: Paroxysmal. Now s/p Maze in 8/19. Remains in NSR. - Coumadin held. She is on heparin drip now for perm HD cath placement.  6. CAD: LHC in 1/19 showed severe ostial LCx disease and moderate LAD disease. She had LIMA-LAD and SVG-OM in 8/19.  - She has chronic atypical CP. Continues to have CP on and off, lasts about 30 minutes. Unrelated to activity. Troponin trend flat. EKG unchanged. Not thought to be ischemic. No change.  -  Continue ASA 81 and atorvastatin 80 mg daily.Good lipids 03/2018 -ContinueImdur 120 mg daily. Continue Coreg 25 mg BID. 7. Disposition - PT/OT recommending SNF. Has SNF bed when ready.   Medication concerns reviewed with patient and pharmacy team. Barriers identified: none  Length of Stay: [place], NP  04/23/2018, 8:11 AM  Advanced Heart Failure Team Pager [phone] (M-F; 7a - 4p)  Please contact [facility] for night-coverage after hours (4p -7a ) and weekends on [email or web address]  Patient seen with NP, agree with the above note.    Patient is now s/p AV graft placement and right IJ dialysis catheter.  On high dose po Lasix per renal.  No changes from out standpoint.  Will see again Monday unless there are changes.   [name] 04/23/2018

## 2018-04-23 NOTE — Progress Notes (Signed)
[place] for heparin Indication: atrial fibrillation  Allergies  Allergen Reactions  . Ace Inhibitors Anaphylaxis and Swelling  . Motrin Ib [Ibuprofen] Anaphylaxis    Patient Measurements: Height: 5\' 9"  (175.3 cm) Weight: 121 lb 1.6 oz (54.9 kg) IBW/kg (Calculated) : 66.2 Heparin Dosing Weight: 65.2 kg   Vital Signs: Temp: 97.8 F (36.6 C) (10/25 0750) Temp Source: Oral (10/25 0750) BP: 141/70 (10/25 0750) Pulse Rate: 60 (10/25 0621)  Labs: Recent Labs    04/21/18 0408 04/22/18 0431 04/22/18 0732 04/23/18 0505 04/23/18 0719  HGB 9.0* 9.1*  --  9.3*  --   HCT 27.6* 29.1*  --  28.5*  --   PLT 279 265  --  301  --   LABPROT 16.2*  --  15.7*  --  15.9*  INR 1.32  --  1.26  --  1.29  HEPARINUNFRC 0.56 0.50  --  0.51  --   CREATININE 5.48* 5.74*  --  6.38*  --     Estimated Creatinine Clearance: 7.5 mL/min (A) (by C-G formula based on SCr of 6.38 mg/dL (H)).   Medical History: Past Medical History:  Diagnosis Date  . Anemia    low iron  . Anxiety   . Arthritis   . Bronchitis   . CHF (congestive heart failure) ([place])   . CKD (chronic kidney disease) stage 4, GFR 15-29 ml/min (HCC) 03/16/2017  . Coronary artery disease   . Depression   . Diabetes mellitus without complication ([place])   . Diet-controlled diabetes mellitus ([place])   . Ganglion cyst    left wrist  . GERD (gastroesophageal reflux disease)   . Headache    chronic headaches  . Heart failure ([place])   . Hyperlipidemia   . Hypertension   . Hypothyroidism   . Mitral regurgitation   . Myocardial infarction ([place])    3x last one 2008  . PAF (paroxysmal atrial fibrillation) ([place])   . Pneumonia    x 2  . S/P Maze operation for atrial fibrillation 02/18/2018   Complete bilateral atrial lesion set using bipolar radiofrequency and cryothermy ablation with clipping of LA appendage  . S/P mitral valve replacement with bioprosthetic valve 02/18/2018   [facility] Mitral  stented bovine pericardial tissue valve Model 7300 TFX Serial # [id number] Size 29  . Stroke [facility])    no lasting residual - ? 2014  . Umbilical hernia     Medications:  Scheduled:  . aspirin EC  81 mg Oral Daily  . atorvastatin  80 mg Oral Daily  . B-complex with vitamin C  1 tablet Oral Daily  . buPROPion  100 mg Oral TID  . busPIRone  15 mg Oral BID  . carvedilol  25 mg Oral BID  . Chlorhexidine Gluconate Cloth  6 each Topical Q0600  . Chlorhexidine Gluconate Cloth  6 each Topical Q0600  . cholecalciferol  1,000 Units Oral Daily  . darbepoetin (ARANESP) injection - NON-DIALYSIS  60 mcg Subcutaneous Q Wed-1800  . doxazosin  8 mg Oral Daily   And  . doxazosin  4 mg Oral QHS  . DULoxetine  20 mg Oral Daily  . furosemide  160 mg Oral BID  . hydrALAZINE  100 mg Oral Q8H  . isosorbide mononitrate  120 mg Oral Daily  . levothyroxine  112 mcg Oral QAC breakfast  . montelukast  10 mg Oral Daily  . pantoprazole  40 mg Oral BID AC  . polyethylene glycol  17 g Oral Daily  . traZODone  25 mg Oral QHS    Assessment: 15 yoF on warfarin PTA for Afib - warfarin now on hold  planning to undergo procedures. Pt reported home regimen as 2.5 mg daily (last dose 10/17 PM).   INR remains subtherapeutic - off warfarin Heparin level remains therapeutic at 0.5 on heparin 1150 units/hr. No signs/symptoms of bleeding or issues with infusion reported. CBC low stable  Goal of Therapy:  INR 2-3 Heparin level 0.3-0.7 units/ml Monitor platelets by anticoagulation protocol: Yes   Plan:  Continue heparin infusion 1150 units/hr Hold warfarin until HD access placed - follow up this aftrnoon Monitor daily HL, CBC, and for s/sx of bleeding   Bonnita Nasuti Pharm.D. CPP, BCPS Clinical Pharmacist [phone] 04/23/2018 9:52 AM

## 2018-04-23 NOTE — Op Note (Signed)
OPERATIVE NOTE   PROCEDURE:   1. Right internal jugular vein tunneled dialysis catheter placement 2. Right internal jugular vein cannulation under ultrasound guidance 3. Left upper arm arteriovenous graft (4 mm x 7 mm tapered Goretex graft)   PRE-OPERATIVE DIAGNOSIS: CKD stage V  POST-OPERATIVE DIAGNOSIS: same  SURGEON: Marty Heck, MD  ASSISTANT(S): Arlee Muslim, PA  ANESTHESIA: MAC  ESTIMATED BLOOD LOSS: <50 cc  FINDING(S): 1.  Tips of the dialysis catheter in the right atrium on fluoroscopy 2.  No obvious pneumothorax on fluoroscopy 3.  Palpable thrill in graft at end of case 4.  Palpable radial pulse at end of case.  SPECIMEN(S):  None  INDICATIONS:   Jocelyn Hill is a 67 y.o. female who presents with stage V CKD for permanent hemodialysis placement and tunneled dialysis catheter.  Risk, benefits, and alternatives to access surgery were discussed.  The patient is aware the risks include but are not limited to: bleeding, infection, steal syndrome, nerve damage, ischemic monomelic neuropathy, failure to mature, and need for additional procedures.  The patient is aware of the risks and elects to proceed forward.  DESCRIPTION: After written full informed consent was obtained from the patient, the patient was taken back to the operating room.  Prior to induction, the patient was given IV antibiotics.  After obtaining adequate sedation, the patient was prepped and draped in the standard fashion for a chest or neck tunneled dialysis catheter placement.   The right neck cannulation site, the catheter exit site, and tract for the subcutaneous tunnel were then anesthestized with a total of 10 cc of 1% lidocaine without epinephrine.  Under ultrasound guidance, the right internal jugular vein was cannulated with the 18 gauge needle.  A J-wire was then placed down into the right ventricle under fluoroscopic guidance.  The wire was then secured in place with a clamp to the  drapes.  I then made stab incisions at the neck and exit sites.   I dissected from the exit site to the cannulation site with a tunneler.   The subcutaneous tunnel was dilated by passing a plastic dilator over the metal dissector. The wire was then unclamped and I removed the needle.  The skin tract and venotomy was dilated serially with dilators.  Finally, the dilator-sheath was placed under fluoroscopic guidance into the superior vena cava.  The dilator and wire were removed.  A 19 cm Diatek catheter was placed under fluoroscopic guidance down into the right atrium.  The sheath was broken and peeled away while holding the catheter cuff at the level of the skin.  The back end of this catheter was transected, and docked onto the tunneler.  The distal catheter was delivered through the subcutaneous tunnel.  The catheter was transected a second time, revealing the two lumens of this catheter.  The ports were docked onto these two lumens.  The catheter collar was then snapped into place.  Each port was tested by aspirating and flushing.  No resistance was noted.  Each port was then thoroughly flushed with heparinized saline.  The catheter was secured in placed with two interrupted stitches of 3-0 Nylon tied to the catheter.  The neck incision was closed with a U-stitch of 4-0 Monocryl.  The neck and chest incision were cleaned and sterile bandages applied.  Each port was then loaded with concentrated heparin (1000 Units/mL) at the manufacturer recommended volumes to each port.  Sterile caps were applied to each port.  On completion fluoroscopy, the tips of the catheter were in the right atrium, and there was no evidence of pneumothorax.  We then turned our attention to the left arm that had already been prepped and drapped.  I turned my attention first to the antecubitum.  Under ultrasound guidance, I identified the location of the brachial artery and marked it on the skin.  I also marked the larger brachial vein  and axillary vein in the upper arm near the axilla with ultrasound as well.  I injected a mixture of 1% lidocaine without epinephrine at each incision site to obtain some anesthesia.  In total, I used 10 mL and then I made a longitudinal incision over the brachial artery and separately axillary vein, and dissected down through the subcutaneous tissue and  fascia carefully and was able to dissect out the brachial artery.  The artery was about 4 mm externally.  It was controlled proximally and distally with vessel loops.  I then dissected out the brachial vein/axillary vein up near the axilla.  Externally, it appeared to be 5 mm in diameter.  I then dissected this vein proximally and distal.  I then marked a loop out on the upper arm for the graft.  I took a Dietitian and dissected from the apical incision to the axillary incision.  Then I delivered the 4 x 7-mm stretch Gore-Tex graft, through this metal tunneler and then pulled out the metal tunneler leaving the graft in place.  I removed the tunnel, leaving the graft in place with the 4-mm end was left on the brachial artery side and the 7-mm end toward the axillary vein side.  I then gave the patient 3000 units of heparin to gain anticoagulation.  After waiting 3 minutes, I placed the brachial artery under tension proximally and distally with vessel loops, made an arteriotomy and extended it with a Potts scissor.  I sewed the 4-mm end of the graft to this arteriotomy with a running stitch of 6-0 Prolene.  At this point, then I completed the anastomosis in the usual fashion.  I released the vessel loops on the inflow and allowed the artery to decompress through the graft. There was good pulsatile bleeding through this graft.  I clamped the graft near its arterial anastomosis and sucked out all the blood in the graft and loaded the graft with heparinized saline.  At this point, I pulled the graft to appropriate length and reset my exposure of the high  brachial vein/axillary vein junction.  There was good venous backbleeding from the vein.  Then, I injected some heparinized saline into this vein and then clamped it.  I then spatulated the graft to facilitate an end-to- side anastomosis.  In the process of spatulating, I cut the graft to appropriate length for this anastomosis.  This graft was sewn to the vein in an end-to-side configuration with a 6-0 Prolene.  Prior to completing this anastomosis, I allowed the vein to back bleed and then I also allowed the artery to bleed in an antegrade fashion.  I completed this anastomosis in the usual fashion, and then irrigated out both incisions with sterile saline.  I used surgicel snow for hemostasis.  The distal radial artery was palpable.  Using a continuous Doppler, the brachial artery proximally and distally had triphasic waveforms.  The venous outflow had a flow signature consistent with widely patent arteriovenous graft and had a palpable thrill.    After waiting a few minutes, there was  no more active bleeding and we gave 10 mg protamine. The subcutaneous tissue in both incisions was reapproximated with a running stitch of 3-0 Vicryl.  The skin was then reapproximated with a running subcuticular 4-0 Monocryl.  The skin was then cleaned, dried, and Dermabond used to reinforce the skin closure.   COMPLICATIONS: None  CONDITION: Stable  Marty Heck, MD [facility] [place] Office: [phone] Pager: [phone]  04/23/2018, 1:28 PM

## 2018-04-24 DIAGNOSIS — Z992 Dependence on renal dialysis: Secondary | ICD-10-CM

## 2018-04-24 DIAGNOSIS — I5023 Acute on chronic systolic (congestive) heart failure: Secondary | ICD-10-CM

## 2018-04-24 LAB — RENAL FUNCTION PANEL
Albumin: 3.3 g/dL — ABNORMAL LOW (ref 3.5–5.0)
Anion gap: 13 (ref 5–15)
BUN: 43 mg/dL — ABNORMAL HIGH (ref 8–23)
CO2: 24 mmol/L (ref 22–32)
Calcium: 10.6 mg/dL — ABNORMAL HIGH (ref 8.9–10.3)
Chloride: 99 mmol/L (ref 98–111)
Creatinine, Ser: 6.34 mg/dL — ABNORMAL HIGH (ref 0.44–1.00)
GFR calc Af Amer: 7 mL/min — ABNORMAL LOW (ref >60)
GFR calc non Af Amer: 6 mL/min — ABNORMAL LOW (ref >60)
Glucose, Bld: 101 mg/dL — ABNORMAL HIGH (ref 70–99)
Phosphorus: 5 mg/dL — ABNORMAL HIGH (ref 2.5–4.6)
Potassium: 3.6 mmol/L (ref 3.5–5.1)
Sodium: 136 mmol/L (ref 135–145)

## 2018-04-24 LAB — PROTIME-INR
INR: 1.33
Prothrombin Time: 16.3 seconds — ABNORMAL HIGH (ref 11.4–15.2)

## 2018-04-24 LAB — CBC
HCT: 30 % — ABNORMAL LOW (ref 36.0–46.0)
Hemoglobin: 9.5 g/dL — ABNORMAL LOW (ref 12.0–15.0)
MCH: 23.5 pg — ABNORMAL LOW (ref 26.0–34.0)
MCHC: 31.7 g/dL (ref 30.0–36.0)
MCV: 74.3 fL — ABNORMAL LOW (ref 80.0–100.0)
Platelets: 301 10*3/uL (ref 150–400)
RBC: 4.04 MIL/uL (ref 3.87–5.11)
RDW: 14.6 % (ref 11.5–15.5)
WBC: 9.2 10*3/uL (ref 4.0–10.5)
nRBC: 0 % (ref 0.0–0.2)

## 2018-04-24 LAB — HEPARIN LEVEL (UNFRACTIONATED): Heparin Unfractionated: 0.33 IU/mL (ref 0.30–0.70)

## 2018-04-24 MED ORDER — FUROSEMIDE 80 MG PO TABS
160.0000 mg | ORAL_TABLET | ORAL | Status: DC
  Administered 2018-04-25: 160 mg via ORAL
  Filled 2018-04-24: qty 2

## 2018-04-24 MED ORDER — HEPARIN (PORCINE) IN NACL 100-0.45 UNIT/ML-% IJ SOLN
1100.0000 [IU]/h | INTRAMUSCULAR | Status: DC
  Administered 2018-04-24 – 2018-04-27 (×4): 1100 [IU]/h via INTRAVENOUS
  Filled 2018-04-24 (×4): qty 250

## 2018-04-24 MED ORDER — HYDROMORPHONE HCL 1 MG/ML IJ SOLN
INTRAMUSCULAR | Status: AC
  Filled 2018-04-24: qty 0.5

## 2018-04-24 MED ORDER — HYDROMORPHONE HCL 1 MG/ML IJ SOLN
0.5000 mg | Freq: Once | INTRAMUSCULAR | Status: AC
  Administered 2018-04-24: 0.5 mg via INTRAVENOUS

## 2018-04-24 MED ORDER — HEPARIN SODIUM (PORCINE) 1000 UNIT/ML IJ SOLN
3000.0000 [IU] | Freq: Once | INTRAMUSCULAR | Status: AC
  Administered 2018-04-24: 3000 [IU] via INTRAVENOUS

## 2018-04-24 MED ORDER — ALPRAZOLAM 0.5 MG PO TABS
ORAL_TABLET | ORAL | Status: AC
  Filled 2018-04-24: qty 1

## 2018-04-24 MED ORDER — CALCIUM CARBONATE ANTACID 500 MG PO CHEW
1.0000 | CHEWABLE_TABLET | Freq: Every day | ORAL | Status: DC
  Administered 2018-04-24 – 2018-04-28 (×5): 200 mg via ORAL
  Filled 2018-04-24 (×5): qty 1

## 2018-04-24 MED ORDER — HEPARIN SODIUM (PORCINE) 1000 UNIT/ML IJ SOLN
INTRAMUSCULAR | Status: AC
  Filled 2018-04-24: qty 3

## 2018-04-24 MED ORDER — WARFARIN SODIUM 5 MG PO TABS
5.0000 mg | ORAL_TABLET | Freq: Once | ORAL | Status: AC
  Administered 2018-04-24: 5 mg via ORAL
  Filled 2018-04-24 (×2): qty 1

## 2018-04-24 NOTE — Progress Notes (Addendum)
  Progress Note    04/24/2018 11:19 AM 1 Day Post-Op  Subjective:  Painful L arm today.  Patient seen on HD.  R IJ [facility] working well per dialysis staff.   Vitals:   04/24/18 1030 04/24/18 1051  BP: (!) 154/78 (!) 158/77  Pulse: 77 78  Resp: 19 18  Temp:  98 F (36.7 C)  SpO2:     Physical Exam: Lungs:  Non labored Incisions:  L axilla and AC fossa incisions with some fullness but soft; some localized edema to tunneled tract in upper arm Extremities:  Palpable and symmetrical radial pulses; palpable thrill and audible bruit L AVG Abdomen:  Soft Neurologic: A&O  CBC    Component Value Date/Time   WBC 9.2 04/24/2018 0715   RBC 4.04 04/24/2018 0715   HGB 9.5 (L) 04/24/2018 0715   HCT 30.0 (L) 04/24/2018 0715   PLT 301 04/24/2018 0715   MCV 74.3 (L) 04/24/2018 0715   MCH 23.5 (L) 04/24/2018 0715   MCHC 31.7 04/24/2018 0715   RDW 14.6 04/24/2018 0715   LYMPHSABS 0.5 (L) 03/03/2018 0415   MONOABS 0.6 03/03/2018 0415   EOSABS 0.7 03/03/2018 0415   BASOSABS 0.1 03/03/2018 0415    BMET    Component Value Date/Time   NA 136 04/24/2018 0500   K 3.6 04/24/2018 0500   CL 99 04/24/2018 0500   CO2 24 04/24/2018 0500   GLUCOSE 101 (H) 04/24/2018 0500   BUN 43 (H) 04/24/2018 0500   CREATININE 6.34 (H) 04/24/2018 0500   CREATININE 3.63 (H) 01/11/2018 1148   CALCIUM 10.6 (H) 04/24/2018 0500   CALCIUM 9.7 04/21/2017 0231   GFRNONAA 6 (L) 04/24/2018 0500   GFRAA 7 (L) 04/24/2018 0500    INR    Component Value Date/Time   INR 1.33 04/24/2018 0715     Intake/Output Summary (Last 24 hours) at 04/24/2018 1119 Last data filed at 04/24/2018 0216 Gross per 24 hour  Intake 500 ml  Output 870 ml  Net -370 ml     Assessment/Plan:  67 y.o. female is s/p R IJ TDC placement and L arm AVG placement 1 Day Post-Op   Seen on HD with [facility] working well Patent L arm AVG with palpable thrill and audible bruit; ok to use for HD in 4 weeks Dr. Carlis Abbott will evaluate today and give  recommendations on restarting anticoagulation  Dagoberto Ligas, PA-C [facility] [phone] 04/24/2018 11:19 AM  I have seen and evaluated the patient. I agree with the PA note as documented above. [facility] working well.  Arm graft with good thrill.  Would hold anticoagulation one more day - oozy in OR.  Marty Heck, MD [facility] [place] Office: [phone] Pager: [phone]

## 2018-04-24 NOTE — Progress Notes (Signed)
Subjective:   had 1000 of UOP recorded.    Kidney numbers stable -  Given trend in kidney function decision made to go ahead and initiate HD chronically - seen in HD- in pain  Objective Vital signs in last 24 hours: Vitals:   04/24/18 0800 04/24/18 0830 04/24/18 0900 04/24/18 0930  BP: (!) 165/82 (!) 155/76 (!) 151/75 (!) 167/85  Pulse: 70 68 71 71  Resp: 18 15 16 14   Temp:      TempSrc:      SpO2:      Weight:      Height:       Weight change:   Intake/Output Summary (Last 24 hours) at 04/24/2018 1003 Last data filed at 04/24/2018 0216 Gross per 24 hour  Intake 500 ml  Output 870 ml  Net -370 ml    Assessment/ Plan: Pt is a 67 y.o. yo female with advanced CKD at baseline who was admitted on 04/16/2018 with what appeared to be uremic symptoms with BUN in the high 50's  Assessment/Plan: 1. Renal- felt to be uremic but BUN only 50's- s/p temp cath and HD on 10/20- was planning for another treatment 10/21 however,  was making good urine and BUN only 26- I decided to observe, numbers have  worsened since - removed femoral temp cath.  Definitely felt she was at risk of needing dialysis again soon given trend- crt nearly 7.  Now s/p AVG and TDC on 10/25 as well for [place] just go ahead and initiate HD.  I did speak with daughter [name] regarding all of this- plan on HD today , being clipped to OP unit.  After today next HD is Tuesday   2. Anemia- hgb trending up without transfusion- 9.0 today - on iron, have added ESA   3. Secondary hyperparathyroidism- has been on nutritional vit D only - phos OK , pth 128.  Calcium now high- will stop vit D 4. HTN/volume- BP high- as OP on amlodipine, coreg, cardura, hydralazine and high dose diuretics - BP much better as volume status is better.  Have changed lasix to PO and stopped norvasc so BP does not get too low- UF with HD 5. Dispo- has access - once clipped to OP unit will be able to be discharged from my standpoint- hopefully early next week     [place]: Basic Metabolic Panel: Recent Labs  Lab 04/23/18 0505 04/23/18 1649 04/24/18 0500  NA 137 137 136  K 3.2* 3.7 3.6  CL 97* 97* 99  CO2 28 26 24   GLUCOSE 103* 123* 101*  BUN 41* 40* 43*  CREATININE 6.38* 6.32* 6.34*  CALCIUM 10.3 10.4* 10.6*  PHOS 4.9* 5.2* 5.0*   Liver Function Tests: Recent Labs  Lab 04/23/18 0505 04/23/18 1649 04/24/18 0500  ALBUMIN 3.3* 3.3* 3.3*   No results for input(s): LIPASE, AMYLASE in the last 168 hours. No results for input(s): AMMONIA in the last 168 hours. CBC: Recent Labs  Lab 04/21/18 0408 04/22/18 0431 04/23/18 0505 04/23/18 1649 04/24/18 0715  WBC 7.5 6.7 7.6 8.3 9.2  HGB 9.0* 9.1* 9.3* 10.2* 9.5*  HCT 27.6* 29.1* 28.5* 31.3* 30.0*  MCV 74.6* 75.8* 73.8* 74.2* 74.3*  PLT 279 265 301 285 301   Cardiac Enzymes: Recent Labs  Lab 04/17/18 1416  TROPONINI 0.06*   CBG: Recent Labs  Lab 04/19/18 1412 04/23/18 1350  GLUCAP 97 138*    Iron Studies:  No results for input(s): IRON,  TIBC, TRANSFERRIN, FERRITIN in the last 72 hours. Studies/Results: Dg Chest Port 1 View  Result Date: 04/23/2018 CLINICAL DATA:  Dialysis catheter insertion. EXAM: PORTABLE CHEST 1 VIEW COMPARISON:  04/16/2018. FINDINGS: Dual-lumen right IJ catheter noted with tip over cavoatrial junction. AICD in stable position. Prior cardiac valve replacement. Left atrial appendage clip. Prior CABG. Cardiomegaly. Pulmonary vascular prominence and bilateral interstitial prominence. Left-sided pleural effusion. Findings suggest CHF. IMPRESSION: 1.  Dual-lumen right IJ catheter with tip at cavoatrial junction. 2. AICD in stable position. Prior CABG and cardiac valve replacement. Cardiomegaly with bilateral pulmonary interstitial prominence and left-sided pleural effusion consistent with CHF noted on today's exam. Electronically Signed   By: [place]   On: 04/23/2018 14:29   Medications: Infusions: . sodium chloride 10  mL/hr at 04/23/18 1002  . ferric gluconate (FERRLECIT/NULECIT) IV Stopped (04/21/18 1900)  . heparin      Scheduled Medications: . aspirin EC  81 mg Oral Daily  . atorvastatin  80 mg Oral Daily  . B-complex with vitamin C  1 tablet Oral Daily  . buPROPion  100 mg Oral TID  . busPIRone  15 mg Oral BID  . carvedilol  25 mg Oral BID  . Chlorhexidine Gluconate Cloth  6 each Topical Q0600  . Chlorhexidine Gluconate Cloth  6 each Topical Q0600  . Chlorhexidine Gluconate Cloth  6 each Topical Q0600  . cholecalciferol  1,000 Units Oral Daily  . darbepoetin (ARANESP) injection - NON-DIALYSIS  60 mcg Subcutaneous Q Wed-1800  . doxazosin  8 mg Oral Daily   And  . doxazosin  4 mg Oral QHS  . DULoxetine  20 mg Oral Daily  . furosemide  160 mg Oral BID  . hydrALAZINE  100 mg Oral Q8H  . isosorbide mononitrate  120 mg Oral Daily  . levothyroxine  112 mcg Oral QAC breakfast  . montelukast  10 mg Oral Daily  . pantoprazole  40 mg Oral BID AC  . polyethylene glycol  17 g Oral Daily  . traZODone  25 mg Oral QHS  . warfarin  5 mg Oral ONCE-1800  . Warfarin - Pharmacist Dosing Inpatient   Does not apply q1800    have reviewed scheduled and prn medications.  Physical Exam: General: flat, - difficult to read- in pain Heart: RRR Lungs: mostly clear- dec BS at extreme bases Abdomen: distended but better Extremities:  Really no periph edema Dialysis Access: TDC on right and AVG on left - good thrill    04/24/2018,10:03 AM  LOS: 7 days

## 2018-04-24 NOTE — Progress Notes (Signed)
[place] for heparin > warfarin Indication: atrial fibrillation, prosthetic mitral valve  Allergies  Allergen Reactions  . Ace Inhibitors Anaphylaxis and Swelling  . Motrin Ib [Ibuprofen] Anaphylaxis    Patient Measurements: Height: 5\' 9"  (175.3 cm) Weight: 123 lb 7.3 oz (56 kg) IBW/kg (Calculated) : 66.2 Heparin Dosing Weight: 65.2 kg   Vital Signs: Temp: 98 F (36.7 C) (10/26 1505) Temp Source: Oral (10/26 1505) BP: 140/76 (10/26 1505) Pulse Rate: 74 (10/26 1505)  Labs: Recent Labs    04/22/18 0431 04/22/18 0732 04/23/18 0505 04/23/18 0719 04/23/18 1649 04/24/18 0500 04/24/18 0715 04/24/18 1921  HGB 9.1*  --  9.3*  --  10.2*  --  9.5*  --   HCT 29.1*  --  28.5*  --  31.3*  --  30.0*  --   PLT 265  --  301  --  285  --  301  --   LABPROT  --  15.7*  --  15.9*  --   --  16.3*  --   INR  --  1.26  --  1.29  --   --  1.33  --   HEPARINUNFRC 0.50  --  0.51  --   --   --   --  0.33  CREATININE 5.74*  --  6.38*  --  6.32* 6.34*  --   --     Estimated Creatinine Clearance: 7.7 mL/min (A) (by C-G formula based on SCr of 6.34 mg/dL (H)).   Medical History: Past Medical History:  Diagnosis Date  . Anemia    low iron  . Anxiety   . Arthritis   . Bronchitis   . CHF (congestive heart failure) ([place])   . CKD (chronic kidney disease) stage 4, GFR 15-29 ml/min (HCC) 03/16/2017  . Coronary artery disease   . Depression   . Diabetes mellitus without complication ([place])   . Diet-controlled diabetes mellitus ([place])   . Ganglion cyst    left wrist  . GERD (gastroesophageal reflux disease)   . Headache    chronic headaches  . Heart failure ([place])   . Hyperlipidemia   . Hypertension   . Hypothyroidism   . Mitral regurgitation   . Myocardial infarction ([place])    3x last one 2008  . PAF (paroxysmal atrial fibrillation) ([place])   . Pneumonia    x 2  . S/P Maze operation for atrial fibrillation 02/18/2018   Complete bilateral atrial  lesion set using bipolar radiofrequency and cryothermy ablation with clipping of LA appendage  . S/P mitral valve replacement with bioprosthetic valve 02/18/2018   [facility] Mitral stented bovine pericardial tissue valve Model 7300 TFX Serial # [id number] Size 29  . Stroke [facility])    no lasting residual - ? 2014  . Umbilical hernia     Assessment: 61 yoF on warfarin PTA for Afib - was on hold planning to undergo procedures. Pt reported home regimen as 2.5 mg daily (last dose 10/17 PM).  S/P AVG and temp HD access 10/25. Heparin stopped d/t oozing.   D/w FMTS this am. Patient with afib and new (32m) mitral prosthetic valve. Patient classified as high risk, will restart heparin bridge until INR closer to goal.  INR slight trend up to 1.33, hgb stable overall to 9.5. No bleeding issues noted. Oozing resolved per FM resident.  PM update - heparin level therapeutic at 0.33. No further bleed issues documented. Patient received warfarin 5mg  dose tonight.  Goal of  Therapy:  INR 2.5-3 Heparin level 0.3-0.7 units/ml Monitor platelets by anticoagulation protocol: Yes   Plan:  Continue heparin at 1100 units/hr Confirmatory heparin level with AM labs Daily heparin level, INR, CBC Monitor  for s/sx of bleeding  Elicia Lamp, PharmD, BCPS Clinical Pharmacist Please check AMION for all [place] contact numbers 04/24/2018 8:30 PM

## 2018-04-24 NOTE — Procedures (Signed)
Patient was seen on dialysis and the procedure was supervised.  BFR 300  Via TDC BP is  167/85.   Patient appears to be tolerating treatment well  Louis Meckel 04/24/2018

## 2018-04-24 NOTE — Progress Notes (Signed)
Family Medicine Teaching Service Daily Progress Note Intern Pager: [phone]  Patient name: Jocelyn Hill Medical record number: [id number] Date of birth: [birth year] Age: 67 y.o. Gender: female  Primary Care Provider: [name], MD Consultants: VVS  Code Status: FULL  Pt Overview and Major Events to Date:  04/17/18 - Admitted to [facility]  04/18/18 - Echo EF 30-35% 04/19/18 - Temporary HD cath placed  04/20/18 - Temp cath removed 04/23/18 - L arm graft and TDC  Assessment and Plan: Jocelyn Hill a 67 y.o.femalepresenting from cardiology officewith elevated creatinine and chest pain. PMH is significant forCHF, CKD IV, HTN,CAD s/p MI and CABG (01/2018),hypothyroidism, paroxysmalA-fibs/p MAZE (01/2018),mitral regurgitation s/p mitral valve replacement (01/2018), microcytic anemia, anxiety/depression.  Acute on Chronic HFrEF: Patient transitioned to PO lasix on 10/24. HD access placed 10/25.  24-hr UOP 1017mL. Weight decreased 2 lbs from day prior (56kg). Wt on admission 65.2kg. SOB improved. Only trace LE edema bilaterally. CBC unchanged. - VVS, cardiology and nephrology following - Daily weights/Strict I&O's - Continue fluid restriction  - continue cardura, coreg, hydralazine, and Imdur  - PT/OT recommend SNF, rolling walker w/ 5" wheels and 3n1 - SNF bed at [facility] approved and pending medical clearance - PT consulted for ambulation with pulse ox  - Follow-up PT/INR, CBC, and renal function  CKD-V now on HD: Trace LE edema, nephro managing.  - Nephrology and VVS following - being clipped for SNF HD - follow renal function labs  HTN: BP improved with increases in Coreg and Doxazosin. On home hydralazine, amlodipine, Coreg, doxazosin. Nephro holding norvasc while initiating HD. - continue Coreg 25mg , Doxazosin 8mg  and hydralazine  - Stop Amlodipine  History of Mitral regurgitation s/p mitral valve replacement Chronic, stable. 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 Biprosthetic MV replacement in August 2019. Post-op echo was stable. Echo unchanged in regards to MV. -bridge to coumadin with heparin per pharmacy -INR goal 2.5-3  Atrial Fibrillation s/p MAZE: Chronic, stable.  MAZE procedure in August 2019. No recurrence since. - warfarin per pharm  Microcytic Anemia: chroic, stable Currently stable at 9.3. Will continue to monitor. Will follow-up neuro recs concerning electrolyte management as pt likely to start HD soon -Ferric gluconate 125mg  qMWF with HD, ESA  Hypothyroidism:  TSH on 10/26/17: 2.884. On Synthroid. - continue home dose  GERD: - protonix 40mg  BID  Anxiety/Depression: Chronic, stable.  On home Wellbutrin, Cymbalta, Xanax PRN, and Trazodone. Cymbalta decreased to 20mg  due to BEERs criteria. Patient tolerating well. Denies HI/SI. - Cymbalta 20mg , Wellbutrin and Trazodone - Xanax PRN qHS anxiety   FEN/GI:- renal diet, PPI Prophylaxis:Warfarin  Disposition: continued inpatient stay for anticoagulation management  Subjective:  Patient lying in bed on dialysis.  States she feels somewhat sleepy due to pain medicine.  When asked about Coumadin, she does know that she is on this medication but cannot recall who manages this.  She endorses pain at her lower extremity former catheter site as well as at her left upper extremity graft site.  Objective: Temp:  [97.2 F (36.2 C)-98.6 F (37 C)] 98 F (36.7 C) (10/26 1051) Pulse Rate:  [62-78] 78 (10/26 1051) Resp:  [8-22] 18 (10/26 1051) BP: (125-170)/(70-89) 158/77 (10/26 1051) SpO2:  [92 %-96 %] 96 % (10/26 1051) Weight:  [55.6 kg-57 kg] 56 kg (10/26 1051) Physical Exam: General: Female lying in bed appears older than stated age, no acute distress Cardiovascular: Regular rate and rhythm Respiratory: Easy work of breathing, clear to auscultation bilaterally Abdomen: Soft nontender  nondistended Extremities: No edema  Laboratory: Recent Labs  Lab  04/23/18 0505 04/23/18 1649 04/24/18 0715  WBC 7.6 8.3 9.2  HGB 9.3* 10.2* 9.5*  HCT 28.5* 31.3* 30.0*  PLT 301 285 301   Recent Labs  Lab 04/23/18 0505 04/23/18 1649 04/24/18 0500  NA 137 137 136  K 3.2* 3.7 3.6  CL 97* 97* 99  CO2 28 26 24   BUN 41* 40* 43*  CREATININE 6.38* 6.32* 6.34*  CALCIUM 10.3 10.4* 10.6*  GLUCOSE 103* 123* 101*    Imaging/Diagnostic Tests: Dg Chest Port 1 View  Result Date: 04/23/2018 CLINICAL DATA:  Dialysis catheter insertion. EXAM: PORTABLE CHEST 1 VIEW COMPARISON:  04/16/2018. FINDINGS: Dual-lumen right IJ catheter noted with tip over cavoatrial junction. AICD in stable position. Prior cardiac valve replacement. Left atrial appendage clip. Prior CABG. Cardiomegaly. Pulmonary vascular prominence and bilateral interstitial prominence. Left-sided pleural effusion. Findings suggest CHF. IMPRESSION: 1.  Dual-lumen right IJ catheter with tip at cavoatrial junction. 2. AICD in stable position. Prior CABG and cardiac valve replacement. Cardiomegaly with bilateral pulmonary interstitial prominence and left-sided pleural effusion consistent with CHF noted on today's exam. Electronically Signed   By: [place]   On: 04/23/2018 14:29    Sela Hilding, MD 04/24/2018, 11:37 AM PGY-3, [place] Intern pager: [phone], text pages welcome

## 2018-04-24 NOTE — Progress Notes (Signed)
[place] for heparin > warfarin Indication: atrial fibrillation, prosthetic mitral valve  Allergies  Allergen Reactions  . Ace Inhibitors Anaphylaxis and Swelling  . Motrin Ib [Ibuprofen] Anaphylaxis    Patient Measurements: Height: 5\' 9"  (175.3 cm) Weight: 125 lb 10.6 oz (57 kg) IBW/kg (Calculated) : 66.2 Heparin Dosing Weight: 65.2 kg   Vital Signs: Temp: 97.6 F (36.4 C) (10/26 0710) Temp Source: Oral (10/26 0710) BP: 167/85 (10/26 0930) Pulse Rate: 71 (10/26 0930)  Labs: Recent Labs    04/22/18 0431 04/22/18 0732 04/23/18 0505 04/23/18 0719 04/23/18 1649 04/24/18 0500 04/24/18 0715  HGB 9.1*  --  9.3*  --  10.2*  --  9.5*  HCT 29.1*  --  28.5*  --  31.3*  --  30.0*  PLT 265  --  301  --  285  --  301  LABPROT  --  15.7*  --  15.9*  --   --  16.3*  INR  --  1.26  --  1.29  --   --  1.33  HEPARINUNFRC 0.50  --  0.51  --   --   --   --   CREATININE 5.74*  --  6.38*  --  6.32* 6.34*  --     Estimated Creatinine Clearance: 7.9 mL/min (A) (by C-G formula based on SCr of 6.34 mg/dL (H)).   Medical History: Past Medical History:  Diagnosis Date  . Anemia    low iron  . Anxiety   . Arthritis   . Bronchitis   . CHF (congestive heart failure) ([place])   . CKD (chronic kidney disease) stage 4, GFR 15-29 ml/min (HCC) 03/16/2017  . Coronary artery disease   . Depression   . Diabetes mellitus without complication ([place])   . Diet-controlled diabetes mellitus ([place])   . Ganglion cyst    left wrist  . GERD (gastroesophageal reflux disease)   . Headache    chronic headaches  . Heart failure ([place])   . Hyperlipidemia   . Hypertension   . Hypothyroidism   . Mitral regurgitation   . Myocardial infarction ([place])    3x last one 2008  . PAF (paroxysmal atrial fibrillation) ([place])   . Pneumonia    x 2  . S/P Maze operation for atrial fibrillation 02/18/2018   Complete bilateral atrial lesion set using bipolar radiofrequency and  cryothermy ablation with clipping of LA appendage  . S/P mitral valve replacement with bioprosthetic valve 02/18/2018   [facility] Mitral stented bovine pericardial tissue valve Model 7300 TFX Serial # [id number] Size 29  . Stroke [facility])    no lasting residual - ? 2014  . Umbilical hernia     Assessment: 2 yoF on warfarin PTA for Afib - was on hold  planning to undergo procedures. Pt reported home regimen as 2.5 mg daily (last dose 10/17 PM).  S/P AVG and temp HD access 10/25. Heparin stopped d/t oozing.   D/w FMTS this am. Patient with afib and new (66m) mitral prosthetic valve. Patient classified as high risk, will restart heparin bridge until INR closer to goal. Patient currently in HD this am.   INR slight trend up to 1.33, hgb stable overall to 9.5. No bleeding issues noted. Oozing resolved per FM resident.   Goal of Therapy:  INR 2.5-3 Heparin level 0.3-0.7 units/ml Monitor platelets by anticoagulation protocol: Yes   Plan:  Restart heparin at 1100 units/hr Warfarin 5mg  x1 tonight Daily INR, CBC Monitor  for s/sx of bleeding  Erin Hearing PharmD., BCPS Clinical Pharmacist 04/24/2018 10:05 AM

## 2018-04-25 DIAGNOSIS — T82898A Other specified complication of vascular prosthetic devices, implants and grafts, initial encounter: Secondary | ICD-10-CM

## 2018-04-25 LAB — CBC
HCT: 30.7 % — ABNORMAL LOW (ref 36.0–46.0)
Hemoglobin: 9.7 g/dL — ABNORMAL LOW (ref 12.0–15.0)
MCH: 23.3 pg — ABNORMAL LOW (ref 26.0–34.0)
MCHC: 31.6 g/dL (ref 30.0–36.0)
MCV: 73.6 fL — ABNORMAL LOW (ref 80.0–100.0)
Platelets: 289 10*3/uL (ref 150–400)
RBC: 4.17 MIL/uL (ref 3.87–5.11)
RDW: 14.6 % (ref 11.5–15.5)
WBC: 9.7 10*3/uL (ref 4.0–10.5)
nRBC: 0 % (ref 0.0–0.2)

## 2018-04-25 LAB — RENAL FUNCTION PANEL
Albumin: 3 g/dL — ABNORMAL LOW (ref 3.5–5.0)
Anion gap: 14 (ref 5–15)
BUN: 24 mg/dL — ABNORMAL HIGH (ref 8–23)
CO2: 26 mmol/L (ref 22–32)
Calcium: 10.2 mg/dL (ref 8.9–10.3)
Chloride: 97 mmol/L — ABNORMAL LOW (ref 98–111)
Creatinine, Ser: 4.14 mg/dL — ABNORMAL HIGH (ref 0.44–1.00)
GFR calc Af Amer: 12 mL/min — ABNORMAL LOW (ref >60)
GFR calc non Af Amer: 10 mL/min — ABNORMAL LOW (ref >60)
Glucose, Bld: 134 mg/dL — ABNORMAL HIGH (ref 70–99)
Phosphorus: 3.4 mg/dL (ref 2.5–4.6)
Potassium: 3.5 mmol/L (ref 3.5–5.1)
Sodium: 137 mmol/L (ref 135–145)

## 2018-04-25 LAB — PROTIME-INR
INR: 1.51
Prothrombin Time: 18 seconds — ABNORMAL HIGH (ref 11.4–15.2)

## 2018-04-25 LAB — HEPARIN LEVEL (UNFRACTIONATED): Heparin Unfractionated: 0.35 IU/mL (ref 0.30–0.70)

## 2018-04-25 MED ORDER — HYDROMORPHONE HCL 1 MG/ML PO LIQD: 1.0000 mg | ORAL | Status: DC | PRN

## 2018-04-25 MED ORDER — HYDROMORPHONE HCL 2 MG PO TABS
2.0000 mg | ORAL_TABLET | ORAL | Status: DC | PRN
  Administered 2018-04-25 – 2018-04-26 (×6): 2 mg via ORAL
  Filled 2018-04-25 (×6): qty 1

## 2018-04-25 MED ORDER — WARFARIN SODIUM 5 MG PO TABS
5.0000 mg | ORAL_TABLET | Freq: Once | ORAL | Status: AC
  Administered 2018-04-25: 5 mg via ORAL
  Filled 2018-04-25: qty 1

## 2018-04-25 MED ORDER — HYDROMORPHONE HCL 2 MG PO TABS
1.0000 mg | ORAL_TABLET | ORAL | Status: DC | PRN
  Administered 2018-04-26 – 2018-04-27 (×3): 1 mg via ORAL
  Filled 2018-04-25 (×3): qty 1

## 2018-04-25 MED ORDER — HYDROMORPHONE HCL 1 MG/ML PO LIQD: 2.0000 mg | ORAL | Status: DC | PRN

## 2018-04-25 MED ORDER — AMLODIPINE BESYLATE 10 MG PO TABS
10.0000 mg | ORAL_TABLET | Freq: Every day | ORAL | Status: DC
  Administered 2018-04-25 – 2018-04-29 (×5): 10 mg via ORAL
  Filled 2018-04-25 (×5): qty 1

## 2018-04-25 NOTE — Progress Notes (Signed)
  Progress Note    04/25/2018 10:00 AM 1 Day Post-Op  Subjective:  L arm swelling continues since upper arm graft placement.   Vitals:   04/25/18 0613 04/25/18 0833  BP: 136/60 (!) 151/82  Pulse:    Resp:    Temp:    SpO2:     Physical Exam: NAD TDC right chest wall c/d/i Left upper arm graft with palpable thrill, arm swelling, no appreciable hemaotma  CBC    Component Value Date/Time   WBC 9.7 04/25/2018 0516   RBC 4.17 04/25/2018 0516   HGB 9.7 (L) 04/25/2018 0516   HCT 30.7 (L) 04/25/2018 0516   PLT 289 04/25/2018 0516   MCV 73.6 (L) 04/25/2018 0516   MCH 23.3 (L) 04/25/2018 0516   MCHC 31.6 04/25/2018 0516   RDW 14.6 04/25/2018 0516   LYMPHSABS 0.5 (L) 03/03/2018 0415   MONOABS 0.6 03/03/2018 0415   EOSABS 0.7 03/03/2018 0415   BASOSABS 0.1 03/03/2018 0415    BMET    Component Value Date/Time   NA 137 04/25/2018 0516   K 3.5 04/25/2018 0516   CL 97 (L) 04/25/2018 0516   CO2 26 04/25/2018 0516   GLUCOSE 134 (H) 04/25/2018 0516   BUN 24 (H) 04/25/2018 0516   CREATININE 4.14 (H) 04/25/2018 0516   CREATININE 3.63 (H) 01/11/2018 1148   CALCIUM 10.2 04/25/2018 0516   CALCIUM 9.7 04/21/2017 0231   GFRNONAA 10 (L) 04/25/2018 0516   GFRAA 12 (L) 04/25/2018 0516    INR    Component Value Date/Time   INR 1.51 04/25/2018 0738     Intake/Output Summary (Last 24 hours) at 04/25/2018 1000 Last data filed at 04/24/2018 1800 Gross per 24 hour  Intake 1326.51 ml  Output 1000 ml  Net 326.51 ml     Assessment/Plan:  67 y.o. female is s/p R IJ TDC placement and L arm AVG placement  Good thrill in graft and TDC working well.  Ok to resume anticoagulation today.  Left arm swelling and may need fistulogram in future to further evaluate - continue elevation and conservative measures.  Marty Heck, MD [facility] [place] Office: [phone] Pager: [place]

## 2018-04-25 NOTE — Progress Notes (Signed)
[place] for heparin > warfarin Indication: atrial fibrillation, prosthetic mitral valve  Allergies  Allergen Reactions  . Ace Inhibitors Anaphylaxis and Swelling  . Motrin Ib [Ibuprofen] Anaphylaxis    Patient Measurements: Height: 5\' 9"  (175.3 cm) Weight: 121 lb 0.5 oz (54.9 kg) IBW/kg (Calculated) : 66.2 Heparin Dosing Weight: 65.2 kg   Vital Signs: BP: 151/82 (10/27 0833)  Labs: Recent Labs    04/23/18 0505 04/23/18 0719 04/23/18 1649 04/24/18 0500 04/24/18 0715 04/24/18 1921 04/25/18 0516 04/25/18 0738  HGB 9.3*  --  10.2*  --  9.5*  --  9.7*  --   HCT 28.5*  --  31.3*  --  30.0*  --  30.7*  --   PLT 301  --  285  --  301  --  289  --   LABPROT  --  15.9*  --   --  16.3*  --   --  18.0*  INR  --  1.29  --   --  1.33  --   --  1.51  HEPARINUNFRC 0.51  --   --   --   --  0.33 0.35  --   CREATININE 6.38*  --  6.32* 6.34*  --   --  4.14*  --     Estimated Creatinine Clearance: 11.6 mL/min (A) (by C-G formula based on SCr of 4.14 mg/dL (H)).   Medical History: Past Medical History:  Diagnosis Date  . Anemia    low iron  . Anxiety   . Arthritis   . Bronchitis   . CHF (congestive heart failure) ([place])   . CKD (chronic kidney disease) stage 4, GFR 15-29 ml/min (HCC) 03/16/2017  . Coronary artery disease   . Depression   . Diabetes mellitus without complication ([place])   . Diet-controlled diabetes mellitus ([place])   . Ganglion cyst    left wrist  . GERD (gastroesophageal reflux disease)   . Headache    chronic headaches  . Heart failure ([place])   . Hyperlipidemia   . Hypertension   . Hypothyroidism   . Mitral regurgitation   . Myocardial infarction ([place])    3x last one 2008  . PAF (paroxysmal atrial fibrillation) ([place])   . Pneumonia    x 2  . S/P Maze operation for atrial fibrillation 02/18/2018   Complete bilateral atrial lesion set using bipolar radiofrequency and cryothermy ablation with clipping of LA appendage  .  S/P mitral valve replacement with bioprosthetic valve 02/18/2018   [facility] Mitral stented bovine pericardial tissue valve Model 7300 TFX Serial # [id number] Size 29  . Stroke [facility])    no lasting residual - ? 2014  . Umbilical hernia     Assessment: 32 yoF on warfarin PTA for Afib - was on hold planning to undergo procedures. Pt reported home regimen as 2.5 mg daily (last dose 10/17 PM).  S/P AVG and temp HD access 10/25. Heparin stopped d/t oozing.   D/w FMTS 10/26 - Patient with afib and new (84m) mitral prosthetic valve. Patient classified as high risk, will restart heparin bridge until INR closer to goal.  Heparin level remains therapeutic. INR trend up to 1.51, hgb stable overall to 9.7, plt wnl. No bleeding issues documented. Oozing resolved per FM resident 10/26.  Goal of Therapy:  INR 2.5-3 Heparin level 0.3-0.7 units/ml Monitor platelets by anticoagulation protocol: Yes   Plan:  Continue heparin at 1100 units/hr Warfarin 5mg  PO x 1 Daily heparin level,  INR, CBC Monitor  for s/sx of bleeding  Elicia Lamp, PharmD, BCPS Clinical Pharmacist Please check AMION for all [place] contact numbers 04/25/2018 10:42 AM

## 2018-04-25 NOTE — Progress Notes (Addendum)
Family Medicine Teaching Service Daily Progress Note Intern Pager: [phone]  Patient name: Jocelyn Hill Medical record number: [id number] Date of birth: February 24, 1951 Age: 67 y.o. Gender: female  Primary Care Provider: [name], MD Consultants: VVS, Nephrology  Code Status: FULL  Pt Overview and Major Events to Date:  04/17/18 - Admitted to [place]  04/18/18 - Echo EF 30-35% 04/19/18 - Temporary HD cath placed  04/20/18 - Temp cath removed 04/23/18 - L arm graft and TDC, Heparin-Warfarin bridge begun 04/24/18 - HD  Assessment and Plan: Jocelyn Hill a 67 y.o.femalepresenting from cardiology officewith elevated creatinine and chest pain. PMH is significant forCHF, CKD IV, HTN,CAD s/p MI and CABG (01/2018),hypothyroidism, paroxysmalA-fibs/p MAZE (01/2018),mitral regurgitation s/p mitral valve replacement (01/2018), microcytic anemia, anxiety/depression.  Left arm Swelling: Diffusely swollen and very tender to palpation. Weak palpable radial pulse. Graft present without any signs of erythema or warmth. Endorsed numbness in fingers. VVS evaluated and opts to monitor with future fistulogarm for further evaluation. Will continue to monitor and manage pain. Some concern for compartment syndrome due to diffuse swelling and reported paraesthesias s/p vascular procedure, however skin is not taught and no palor noted.  Will work up further if no improvement in pain with pain medications.  - VVS following, appreciate recs - Continue to monitor for bleed and swelling - Begin Dilaudid 1mg  q4 PRN moderate pain - Begin Dilaudid 2mg  q4 PRN severe pain  - stop oxycodone  - Will begin to monitor closely  Acute on Chronic HFrEF: Patient transitioned to PO lasix on 10/24. HD access placed 10/25. HD 1L yesterday + 0.5L UOP ON. Weight decreased from day prior (56kg>54.9kg). Wt on admission 65.2kg. SOB improved. O2 sats 95-97% on RA. Only trace LE edema bilaterally. CBC unchanged. - VVS,  cardiology and nephrology following - Daily weights/Strict I&O's - Continue fluid restriction  - continue cardura, coreg, hydralazine, and Imdur  - PT/OT recommend SNF, rolling walker w/ 5" wheels and 3n1 - SNF bed at [facility] approved and pending medical clearance - Follow-up PT/INR, CBC, and renal function  CKD-V now on HD: Trace LE edema, nephro managing. Right internal jugular vein TDC and left upper arm AV graft. HD yesterday, 1L output. Next HD on 10/29. Per VVS, graft should be ready for use in 4 weeks. Renal function improved s/p HD yesterday, stable electrolytes.   - Nephrology and VVS following  -Appt with VVS on 11/18 - being clipped for SNF HD - follow renal function labs  HTN: BP more elevated overnight, 159/73 this morning. On home hydralazine, amlodipine, Coreg, doxazosin. Nephro held norvasc while initiating HD. After discussion with Nephro, will discontinue doxazosin and add Norvasc - continue Coreg 25mg  and hydralazine  - Stop Doxazosin - Begin Amlodipine 10mg  QD - Will continue to monitor  History of Mitral regurgitation s/p mitral valve replacement Chronic, stable. 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 Biprosthetic MV replacement in August 2019. Post-op echo was stable. Echo unchanged in regards to MV. Initiated Coumadin/Heparain bridge on 10/25 with goal INR 2.0-3.0, INR today 1.5. No signs of bleeding at either graft or tunnel site. -bridge to coumadin with heparin per pharmacy -INR goal 2-3  Atrial Fibrillation s/p MAZE: Chronic, stable.  MAZE procedure in August 2019. No recurrence since.HR: 78-99. - warfarin per pharm  Microcytic Anemia: chroic, stable Currently stable at 9.7. Will continue to monitor. Will follow-up neuro recs concerning electrolyte management now that pt has started HD. -Ferric gluconate 125mg  qMWF with HD, ESA - Vitamin D  discontinued 2/2 high calcium  Hypothyroidism:  TSH on 10/26/17: 2.884. On Synthroid. - continue home  dose  GERD: - protonix 40mg  BID  Anxiety/Depression: Chronic, stable.  On home Wellbutrin, Cymbalta, Xanax PRN, and Trazodone. Cymbalta decreased to 20mg  due to BEERs criteria. Patient tolerating well. Denies HI/SI. - Cymbalta 20mg , Wellbutrin and Trazodone - Xanax PRN qHS anxiety   FEN/GI:- renal diet, PPI Prophylaxis:Warfarin  Disposition: continued inpatient stay for anticoagulation management  Subjective:  Patient in severe pain this morning, in tears during exam. She notes tolerating HD find yesterday but has been in constant pain along her left arm without relief with Oxy.   Objective: Temp:  [97.4 F (36.3 C)] 97.4 F (36.3 C) (10/26 2233) Pulse Rate:  [72] 72 (10/26 2233) Resp:  [17] 17 (10/26 2233) BP: (114-151)/(54-82) 114/54 (10/27 1343) SpO2:  [97 %] 97 % (10/26 2233) Weight:  [54.9 kg] 54.9 kg (10/27 7902) Physical Exam:  Gen: Very uncomfortable due to the pain, in tears throughout entire exam  Skin: Warm and dry. No obvious rashes, lesions, or trauma. Right IJV TDC without erythema, edema, or warmth. Left arm diffusely swollen. Graft site appears well without erythema or warmth. Skin along arm does not appear to be tight.  CV: RRR.   <2s capillary refill bilaterally.  Weak palpable left radial pulse. Trace LE edema Resp: CTAB.  No wheezing, rales, abnormal lung sounds.  No increased WOB,  Extremities: Left arm diffusely painful with severe pain along lateral aspect of forearm and graft site when palpated.    Laboratory: Recent Labs  Lab 04/23/18 1649 04/24/18 0715 04/25/18 0516  WBC 8.3 9.2 9.7  HGB 10.2* 9.5* 9.7*  HCT 31.3* 30.0* 30.7*  PLT 285 301 289   Recent Labs  Lab 04/23/18 1649 04/24/18 0500 04/25/18 0516  NA 137 136 137  K 3.7 3.6 3.5  CL 97* 99 97*  CO2 26 24 26   BUN 40* 43* 24*  CREATININE 6.32* 6.34* 4.14*  CALCIUM 10.4* 10.6* 10.2  GLUCOSE 123* 101* 134*    Imaging/Diagnostic Tests: No results found.  Mina [place],  DO 04/25/2018, 3:36 PM PGY-1, [place] Intern pager: [phone], text pages welcome

## 2018-04-25 NOTE — Progress Notes (Signed)
Subjective:   HD yest- removed 1000- was miserable during it- still with pain   Objective Vital signs in last 24 hours: Vitals:   04/24/18 2233 04/25/18 0612 04/25/18 0613 04/25/18 0833  BP: 131/77 136/60 136/60 (!) 151/82  Pulse: 72     Resp: 17     Temp: (!) 97.4 F (36.3 C)     TempSrc: Oral     SpO2: 97%     Weight:   54.9 kg   Height:       Weight change: 2.07 kg  Intake/Output Summary (Last 24 hours) at 04/25/2018 1221 Last data filed at 04/25/2018 1155 Gross per 24 hour  Intake 1513.55 ml  Output 650 ml  Net 863.55 ml    Assessment/ Plan: Pt is a 67 y.o. yo female with advanced CKD at baseline who was admitted on 04/16/2018 with what appeared to be uremic symptoms  Assessment/Plan: 1. Renal- felt to be uremic but BUN only 50's- s/p temp cath and HD on 10/20- was planning for another treatment 10/21 however,  was making good urine and BUN only 26- I decided to observe, numbers have  worsened since - removed femoral temp cath.  Definitely felt she was at risk of needing dialysis again soon given trend- crt nearly 7.  Now s/p AVG and TDC on 10/25 as well for [place] just go ahead and initiate HD.  I did speak with daughter [name] regarding all of this- running TTS for now , being clipped to OP unit.   next HD is Tuesday.  AVG with good thrill- some edema and hand slightly cool- she says numb- will watch    2. Anemia- hgb trending up without transfusion- 9.7 today - on iron, have added ESA   3. Secondary hyperparathyroidism- has been on nutritional vit D only - phos OK , pth 128.  Calcium now high- will stop vit D 4. HTN/volume- BP high- as OP on amlodipine, coreg, cardura, hydralazine and high dose diuretics - BP much better as volume status is better.  Have changed lasix to PO (making between 500 and 900 daily ) and stopped norvasc so BP does not get too low- UF with HD 5. Dispo- has access - once clipped to OP unit will be able to be discharged from my standpoint- hopefully early  next week - pt has not done well emotionally with this transition- tried to encourage    [place]: Basic Metabolic Panel: Recent Labs  Lab 04/23/18 1649 04/24/18 0500 04/25/18 0516  NA 137 136 137  K 3.7 3.6 3.5  CL 97* 99 97*  CO2 26 24 26   GLUCOSE 123* 101* 134*  BUN 40* 43* 24*  CREATININE 6.32* 6.34* 4.14*  CALCIUM 10.4* 10.6* 10.2  PHOS 5.2* 5.0* 3.4   Liver Function Tests: Recent Labs  Lab 04/23/18 1649 04/24/18 0500 04/25/18 0516  ALBUMIN 3.3* 3.3* 3.0*   No results for input(s): LIPASE, AMYLASE in the last 168 hours. No results for input(s): AMMONIA in the last 168 hours. CBC: Recent Labs  Lab 04/22/18 0431 04/23/18 0505 04/23/18 1649 04/24/18 0715 04/25/18 0516  WBC 6.7 7.6 8.3 9.2 9.7  HGB 9.1* 9.3* 10.2* 9.5* 9.7*  HCT 29.1* 28.5* 31.3* 30.0* 30.7*  MCV 75.8* 73.8* 74.2* 74.3* 73.6*  PLT 265 301 285 301 289   Cardiac Enzymes: No results for input(s): CKTOTAL, CKMB, CKMBINDEX, TROPONINI in the last 168 hours. CBG: Recent Labs  Lab 04/19/18 1412 04/23/18 1350  GLUCAP 97 138*    Iron Studies:  No results for input(s): IRON, TIBC, TRANSFERRIN, FERRITIN in the last 72 hours. Studies/Results: Dg Chest Port 1 View  Result Date: 04/23/2018 CLINICAL DATA:  Dialysis catheter insertion. EXAM: PORTABLE CHEST 1 VIEW COMPARISON:  04/16/2018. FINDINGS: Dual-lumen right IJ catheter noted with tip over cavoatrial junction. AICD in stable position. Prior cardiac valve replacement. Left atrial appendage clip. Prior CABG. Cardiomegaly. Pulmonary vascular prominence and bilateral interstitial prominence. Left-sided pleural effusion. Findings suggest CHF. IMPRESSION: 1.  Dual-lumen right IJ catheter with tip at cavoatrial junction. 2. AICD in stable position. Prior CABG and cardiac valve replacement. Cardiomegaly with bilateral pulmonary interstitial prominence and left-sided pleural effusion consistent with CHF noted on today's exam.  Electronically Signed   By: [place]   On: 04/23/2018 14:29   Medications: Infusions: . sodium chloride 10 mL/hr at 04/23/18 1002  . ferric gluconate (FERRLECIT/NULECIT) IV Stopped (04/21/18 1900)  . heparin 1,100 Units/hr (04/25/18 3419)    Scheduled Medications: . aspirin EC  81 mg Oral Daily  . atorvastatin  80 mg Oral Daily  . B-complex with vitamin C  1 tablet Oral Daily  . buPROPion  100 mg Oral TID  . busPIRone  15 mg Oral BID  . calcium carbonate  1 tablet Oral Daily  . carvedilol  25 mg Oral BID  . Chlorhexidine Gluconate Cloth  6 each Topical Q0600  . Chlorhexidine Gluconate Cloth  6 each Topical Q0600  . Chlorhexidine Gluconate Cloth  6 each Topical Q0600  . darbepoetin (ARANESP) injection - NON-DIALYSIS  60 mcg Subcutaneous Q Wed-1800  . doxazosin  8 mg Oral Daily   And  . doxazosin  4 mg Oral QHS  . DULoxetine  20 mg Oral Daily  . furosemide  160 mg Oral Q M,W,F,Su-1800  . furosemide  160 mg Oral Q M,W,F,Su-1800  . hydrALAZINE  100 mg Oral Q8H  . isosorbide mononitrate  120 mg Oral Daily  . levothyroxine  112 mcg Oral QAC breakfast  . montelukast  10 mg Oral Daily  . pantoprazole  40 mg Oral BID AC  . polyethylene glycol  17 g Oral Daily  . traZODone  25 mg Oral QHS  . warfarin  5 mg Oral ONCE-1800  . Warfarin - Pharmacist Dosing Inpatient   Does not apply q1800    have reviewed scheduled and prn medications.  Physical Exam: General: flat, - difficult to read- in pain Heart: RRR Lungs: mostly clear- dec BS at extreme bases Abdomen: distended but better Extremities:  Really no periph edema Dialysis Access: TDC on right and AVG on left - good thrill - some coolness to left hand   04/25/2018,12:21 PM  LOS: 8 days

## 2018-04-26 ENCOUNTER — Ambulatory Visit: Payer: Self-pay | Admitting: Thoracic Surgery (Cardiothoracic Vascular Surgery)

## 2018-04-26 ENCOUNTER — Encounter (HOSPITAL_COMMUNITY): Payer: Self-pay | Admitting: Vascular Surgery

## 2018-04-26 DIAGNOSIS — T82898D Other specified complication of vascular prosthetic devices, implants and grafts, subsequent encounter: Secondary | ICD-10-CM

## 2018-04-26 LAB — RENAL FUNCTION PANEL
Albumin: 3.4 g/dL — ABNORMAL LOW (ref 3.5–5.0)
Anion gap: 13 (ref 5–15)
BUN: 34 mg/dL — ABNORMAL HIGH (ref 8–23)
CO2: 23 mmol/L (ref 22–32)
Calcium: 10.5 mg/dL — ABNORMAL HIGH (ref 8.9–10.3)
Chloride: 98 mmol/L (ref 98–111)
Creatinine, Ser: 5.37 mg/dL — ABNORMAL HIGH (ref 0.44–1.00)
GFR calc Af Amer: 9 mL/min — ABNORMAL LOW (ref >60)
GFR calc non Af Amer: 8 mL/min — ABNORMAL LOW (ref >60)
Glucose, Bld: 117 mg/dL — ABNORMAL HIGH (ref 70–99)
Phosphorus: 4.3 mg/dL (ref 2.5–4.6)
Potassium: 3.7 mmol/L (ref 3.5–5.1)
Sodium: 134 mmol/L — ABNORMAL LOW (ref 135–145)

## 2018-04-26 LAB — CBC
HCT: 32.3 % — ABNORMAL LOW (ref 36.0–46.0)
Hemoglobin: 10.3 g/dL — ABNORMAL LOW (ref 12.0–15.0)
MCH: 23.3 pg — ABNORMAL LOW (ref 26.0–34.0)
MCHC: 31.9 g/dL (ref 30.0–36.0)
MCV: 72.9 fL — ABNORMAL LOW (ref 80.0–100.0)
Platelets: 299 10*3/uL (ref 150–400)
RBC: 4.43 MIL/uL (ref 3.87–5.11)
RDW: 14.8 % (ref 11.5–15.5)
WBC: 9.3 10*3/uL (ref 4.0–10.5)
nRBC: 0 % (ref 0.0–0.2)

## 2018-04-26 LAB — PROTIME-INR
INR: 1.95
Prothrombin Time: 22 seconds — ABNORMAL HIGH (ref 11.4–15.2)

## 2018-04-26 LAB — HEPARIN LEVEL (UNFRACTIONATED): Heparin Unfractionated: 0.49 IU/mL (ref 0.30–0.70)

## 2018-04-26 MED ORDER — SENNA 8.6 MG PO TABS
2.0000 | ORAL_TABLET | Freq: Every day | ORAL | Status: DC
  Administered 2018-04-26: 17.2 mg via ORAL
  Filled 2018-04-26 (×5): qty 2

## 2018-04-26 MED ORDER — WARFARIN SODIUM 2.5 MG PO TABS
2.5000 mg | ORAL_TABLET | Freq: Every day | ORAL | Status: AC
  Administered 2018-04-26: 2.5 mg via ORAL
  Filled 2018-04-26: qty 1

## 2018-04-26 MED ORDER — NEPRO/CARBSTEADY PO LIQD
237.0000 mL | Freq: Three times a day (TID) | ORAL | Status: DC
  Administered 2018-04-26 – 2018-04-30 (×8): 237 mL via ORAL

## 2018-04-26 MED ORDER — MORPHINE SULFATE (PF) 2 MG/ML IV SOLN: 2.0000 mg | INTRAVENOUS | Status: DC | PRN

## 2018-04-26 MED ORDER — SODIUM CHLORIDE 0.9 % IV SOLN
125.0000 mg | INTRAVENOUS | Status: DC
  Administered 2018-04-27 – 2018-04-29 (×2): 125 mg via INTRAVENOUS
  Filled 2018-04-26 (×5): qty 10

## 2018-04-26 MED ORDER — ACETAMINOPHEN 325 MG PO TABS
650.0000 mg | ORAL_TABLET | Freq: Four times a day (QID) | ORAL | Status: DC
  Administered 2018-04-26 – 2018-05-01 (×19): 650 mg via ORAL
  Filled 2018-04-26 (×21): qty 2

## 2018-04-26 MED ORDER — RENA-VITE PO TABS
1.0000 | ORAL_TABLET | Freq: Every day | ORAL | Status: DC
  Administered 2018-04-26 – 2018-04-30 (×5): 1 via ORAL
  Filled 2018-04-26 (×5): qty 1

## 2018-04-26 MED ORDER — FUROSEMIDE 80 MG PO TABS
160.0000 mg | ORAL_TABLET | ORAL | Status: DC
  Administered 2018-04-26 – 2018-04-28 (×2): 160 mg via ORAL
  Filled 2018-04-26 (×3): qty 2

## 2018-04-26 NOTE — Progress Notes (Signed)
  Progress Note    04/26/2018 10:57 AM 1 Day Post-Op  Subjective:  L arm swelling continues since upper arm graft placement.  Good thrill in graft.   Vitals:   04/26/18 0621 04/26/18 0846  BP: (!) 150/77 137/77  Pulse:    Resp: 17   Temp: 98.2 F (36.8 C)   SpO2: 100% 98%   Physical Exam: NAD TDC right chest wall c/d/i Left upper arm graft with palpable thrill, arm swelling, no appreciable hemaotma  CBC    Component Value Date/Time   WBC 9.3 04/26/2018 0648   RBC 4.43 04/26/2018 0648   HGB 10.3 (L) 04/26/2018 0648   HCT 32.3 (L) 04/26/2018 0648   PLT 299 04/26/2018 0648   MCV 72.9 (L) 04/26/2018 0648   MCH 23.3 (L) 04/26/2018 0648   MCHC 31.9 04/26/2018 0648   RDW 14.8 04/26/2018 0648   LYMPHSABS 0.5 (L) 03/03/2018 0415   MONOABS 0.6 03/03/2018 0415   EOSABS 0.7 03/03/2018 0415   BASOSABS 0.1 03/03/2018 0415    BMET    Component Value Date/Time   NA 134 (L) 04/26/2018 0648   K 3.7 04/26/2018 0648   CL 98 04/26/2018 0648   CO2 23 04/26/2018 0648   GLUCOSE 117 (H) 04/26/2018 0648   BUN 34 (H) 04/26/2018 0648   CREATININE 5.37 (H) 04/26/2018 0648   CREATININE 3.63 (H) 01/11/2018 1148   CALCIUM 10.5 (H) 04/26/2018 0648   CALCIUM 9.7 04/21/2017 0231   GFRNONAA 8 (L) 04/26/2018 0648   GFRAA 9 (L) 04/26/2018 0648    INR    Component Value Date/Time   INR 1.95 04/26/2018 0711     Intake/Output Summary (Last 24 hours) at 04/26/2018 1057 Last data filed at 04/26/2018 0900 Gross per 24 hour  Intake 427.04 ml  Output 950 ml  Net -522.96 ml     Assessment/Plan:  67 y.o. female is s/p R IJ TDC placement and L arm AVG placement  Good thrill in graft and [place] working well.  Left arm swelling and may need fistulogram in future to further evaluate.  No hematoma appreciated.  Would delay fistulogram at least 4 weeks post-op.  Marty Heck, MD [facility] Office: [phone] Pager: [place]

## 2018-04-26 NOTE — Progress Notes (Addendum)
Advanced Heart Failure Rounding Note  PCP-Cardiologist: [name], MD   Subjective:    S/p AVF on 10/25. Had problems with swelling over the weekend with poor pulse. VVS now following.   Had HD on 10/26 with 1 L off. Remains on 160 mg PO lasix daily per nephrology with good UOP. Weight 121 lbs today, down 22 lbs total. Creatinine 5.37 today. Planning for HD T/R/Sa  Back on coumadin. INR 1.95.   SBP 130-150s. Planning for SNF once ready for DC.   LUE still painful. Feeling anxious today.   Objective:   Weight Range: 55.2 kg Body mass index is 17.97 kg/m.   Vital Signs:   Temp:  [97.8 F (36.6 C)-98.2 F (36.8 C)] 98.2 F (36.8 C) (10/28 0621) Pulse Rate:  [72] 72 (10/27 2130) Resp:  [16-17] 17 (10/28 0621) BP: (114-150)/(54-79) 137/77 (10/28 0846) SpO2:  [100 %] 100 % (10/28 0621) Weight:  [55.2 kg] 55.2 kg (10/28 0621) Last BM Date: 04/21/18  Weight change: Filed Weights   04/24/18 1051 04/25/18 0613 04/26/18 0621  Weight: 56 kg 54.9 kg 55.2 kg    Intake/Output:   Intake/Output Summary (Last 24 hours) at 04/26/2018 0906 Last data filed at 04/26/2018 0900 Gross per 24 hour  Intake 427.04 ml  Output 950 ml  Net -522.96 ml      Physical Exam   General: Thin. Appears chronically ill. No resp difficulty. HEENT: Normal Neck: Supple. JVP 7-8. Carotids 2+ bilat; no bruits. No thyromegaly or nodule noted. Cor: PMI nondisplaced. RRR, No M/G/R noted Right tunneled HD cath. Lungs: CTAB, normal effort. Abdomen: Soft, non-tender, non-distended, no HSM. No bruits or masses. +BS  Extremities: No cyanosis, clubbing, or rash. LUE swollen and very tender. AVF present.  Neuro: Alert & orientedx3, cranial nerves grossly intact. moves all 4 extremities w/o difficulty. Affect pleasant   Telemetry   BiV paced 70s with underlying NSR. personally reviewed.   EKG    No new tracings.   Labs    CBC Recent Labs    04/25/18 0516 04/26/18 0648  WBC 9.7 9.3    HGB 9.7* 10.3*  HCT 30.7* 32.3*  MCV 73.6* 72.9*  PLT 289 166   Basic Metabolic Panel Recent Labs    04/25/18 0516 04/26/18 0648  NA 137 134*  K 3.5 3.7  CL 97* 98  CO2 26 23  GLUCOSE 134* 117*  BUN 24* 34*  CREATININE 4.14* 5.37*  CALCIUM 10.2 10.5*  PHOS 3.4 4.3   Liver Function Tests Recent Labs    04/25/18 0516 04/26/18 0648  ALBUMIN 3.0* 3.4*   No results for input(s): LIPASE, AMYLASE in the last 72 hours. Cardiac Enzymes No results for input(s): CKTOTAL, CKMB, CKMBINDEX, TROPONINI in the last 72 hours.  BNP: BNP (last 3 results) Recent Labs    02/17/18 0353 03/11/18 1458 04/16/18 1805  BNP 675.7* 1,452.6* 2,442.0*    ProBNP (last 3 results) No results for input(s): PROBNP in the last 8760 hours.   D-Dimer No results for input(s): DDIMER in the last 72 hours. Hemoglobin A1C No results for input(s): HGBA1C in the last 72 hours. Fasting Lipid Panel No results for input(s): CHOL, HDL, LDLCALC, TRIG, CHOLHDL, LDLDIRECT in the last 72 hours. Thyroid Function Tests No results for input(s): TSH, T4TOTAL, T3FREE, THYROIDAB in the last 72 hours.  Invalid input(s): FREET3  Other results:   Imaging    No results found.   Medications:     Scheduled Medications: . amLODipine  10 mg Oral Daily  . aspirin EC  81 mg Oral Daily  . atorvastatin  80 mg Oral Daily  . B-complex with vitamin C  1 tablet Oral Daily  . buPROPion  100 mg Oral TID  . busPIRone  15 mg Oral BID  . calcium carbonate  1 tablet Oral Daily  . carvedilol  25 mg Oral BID  . Chlorhexidine Gluconate Cloth  6 each Topical Q0600  . Chlorhexidine Gluconate Cloth  6 each Topical Q0600  . Chlorhexidine Gluconate Cloth  6 each Topical Q0600  . darbepoetin (ARANESP) injection - NON-DIALYSIS  60 mcg Subcutaneous Q Wed-1800  . DULoxetine  20 mg Oral Daily  . furosemide  160 mg Oral Q M,W,F,Su-1800  . hydrALAZINE  100 mg Oral Q8H  . isosorbide mononitrate  120 mg Oral Daily  .  levothyroxine  112 mcg Oral QAC breakfast  . montelukast  10 mg Oral Daily  . pantoprazole  40 mg Oral BID AC  . polyethylene glycol  17 g Oral Daily  . traZODone  25 mg Oral QHS  . Warfarin - Pharmacist Dosing Inpatient   Does not apply q1800    Infusions: . sodium chloride 10 mL/hr at 04/23/18 1002  . ferric gluconate (FERRLECIT/NULECIT) IV Stopped (04/21/18 1900)  . heparin 1,100 Units/hr (04/26/18 0146)    PRN Medications: acetaminophen, albuterol, ALPRAZolam, HYDROmorphone **OR** HYDROmorphone, ondansetron (ZOFRAN) IV    Patient Profile   Jocelyn Hill. Broweris a67 y.o.femalewith h/o mitral regurgitation s/p bioprosthetic MVR 07/6107, chronic diastolic CHF, CAD s/p CABG with LIMA-LAD and SVG-LCx 01/2018, long standing HTN, DM2, CKD IV-V, h/o Stroke, anxiety, depression, GERD. Admitted in 8/19 for Bioprosthetic MVR, Maze, LA appendage clip, PFO closure, and CABG with LIMA-LAD and SVG-LCx. This was complicated by AKI and complete heart block. St Jude CRT-P device placed. Echo in 8/19 post-op showed EF 45-50%, stable bioprosthetic mitral valve.   She was sent to ED 04/16/18 with A/C HF and creatinine 6.0.   Assessment/Plan   1. Acute on chronic combined CHF: Echo (8/19, post-op) with EF 45-50%. Has St Jude CRT-P device due to post-op complete heart block.  - Echo this admission with lower EF 30-35%. - Volume status improved. Overall weight down 22 pounds.  - Volume per HD. Remains on high dose PO lasix as well. Defer to nephrology. - Continue coreg 25 mg BID - Continue hydralazine 100 mg q8 and imdur 120 mg daily - No spiro with CKD IV - No ACE with hx of angioedema/CKD IV 2. CKD Stage IV: Had R hydronephrosis in setting of hydronephrosis and required percutaneous drain in 9/18 (removed). Renal US 04/14/17 showed no further evidence of hydronephrosis. - Creatinine peaked at 6.17. 5.37 today.  - Had HD with 1 L off 10/26.  - Has AVF and tunneled HD cath. Had some  problems with swelling and pain post op. Planning for HD T/R/Sa. - She is still on lasix 160 mg daily. Defer to nephrology.  3. HTN: SBP 130-150. Off amlodipine to allow high BP. May need to cut back doxazosin next. - Continue hydralazine 100 mg tid.  -Continue coreg 25 mg BID.  4. 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 Had bioprosthetic MVR in 8/19. Post-op echo in 8/19 showed stable bioprosthetic mitral valve. Echo this admission showed mean gradient 5 mm Hg. No change. 5. Atrial fibrillation: Paroxysmal. Now s/p Maze in 8/19. Remains in NSR - Back on coumadin. INR 1.95. 6. CAD: LHC in 1/19 showed severe ostial LCx disease and moderate  LAD disease. She had LIMA-LAD and SVG-OM in 8/19.  - She has chronic atypical CP. Continues to have CP on and off, lasts about 30 minutes. Unrelated to activity. Troponin trend flat. EKG unchanged. Not thought to be ischemic. No change. - Continue ASA 81 and atorvastatin 80 mg daily.Good lipids 03/2018 -ContinueImdur 120 mg daily. Continue Coreg 25 mg BID. 7. Disposition - PT/OT recommending SNF. Has SNF bed when ready. No change.  8. LUE swelling - VVS following. Plans to watch for now. May need fistulogram in future to further evaluate - Still very tender. She would not let me check for bruit/thrill.  Medication concerns reviewed with patient and pharmacy team. Barriers identified: none  Length of Stay: [place], NP  04/26/2018, 9:06 AM  Advanced Heart Failure Team Pager [phone] (M-F; 7a - 4p)  Please contact [facility] for night-coverage after hours (4p -7a ) and weekends on [email or web address]  Patient seen with NP, agree with the above note.    She is back on warfarin, INR near therapeutic.   Decision has been made to start HD TTS.  She is getting Lasix 160 mg daily on non-HD days.  BP is controlled.   [name] 04/26/2018

## 2018-04-26 NOTE — Progress Notes (Signed)
Occupational Therapy Treatment Patient Details Name: Jocelyn Hill MRN: [id number] DOB: 1950/08/17 Today's Date: 04/26/2018    History of present illness Pt is a 67 y.o. female admitted 04/16/18 with chest pain, leg edema and SOB. Worked up for CHF and elevated creatinine. Temporary femoral HD cath removed 10/22. 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   OT comments  Pt slowly progressing towards OT goals, handoff from PT at start of treatment session. Pt requiring modA for toileting ADL this session, able to perform room level mobility using rollator with minA. Pt demonstrates increased fatigue with minimal activity requiring seated rest breaks throughout. Pt also limited due to painful LUE with movement/functional task completion. Pt on RA during activity with O2 sats maintaining >95%. Feel POC remains appropriate at this time. Will continue per POC.    Follow Up Recommendations  SNF;Supervision/Assistance - 24 hour    Equipment Recommendations  3 in 1 bedside commode          Precautions / Restrictions Precautions Precautions: Fall Restrictions Weight Bearing Restrictions: No       Mobility Bed Mobility               General bed mobility comments: OOB upon arrival  Transfers Overall transfer level: Needs assistance Equipment used: 4-wheeled walker Transfers: Sit to/from Stand Sit to Stand: Min guard         General transfer comment: close minguard for safety; increased time/effort, pt stood from seat of rollator and BSC    Balance Overall balance assessment: Needs assistance Sitting-balance support: Feet unsupported Sitting balance-Leahy Scale: Good     Standing balance support: Bilateral upper extremity supported;Single extremity supported;During functional activity Standing balance-Leahy Scale: Poor Standing balance comment: reliant on UE support/external assist for balance                           ADL  either performed or assessed with clinical judgement   ADL Overall ADL's : Needs assistance/impaired                         Toilet Transfer: Minimal assistance;Stand-pivot Toilet Transfer Details (indicate cue type and reason): use of HHA for stand pivot transfer to [place]- Clothing Manipulation and Hygiene: Minimal assistance;Moderate assistance;Sit to/from stand Toileting - Clothing Manipulation Details (indicate cue type and reason): pt able to perform peri-care in standing with minA for balance; overall able to manage underwear though requires minA to advance underwear over L hip due to pain with use of LUE, assist for gown management     Functional mobility during ADLs: Minimal assistance(using rollator this session) General ADL Comments: pt with continued fatigue with min activity, requires seated rest breaks     Vision       Perception     Praxis      Cognition Arousal/Alertness: Awake/alert Behavior During Therapy: Flat affect;WFL for tasks assessed/performed Overall Cognitive Status: Within Functional Limits for tasks assessed                                 General Comments: slow to respond at times, though suspect more due to fatigue        Exercises Hand Exercises Digit Composite Flexion: AROM;5 reps;Left Composite Extension: AROM;Left;5 reps   Shoulder Instructions       General Comments VSS, Pt on RA during  activity this session with O2 sats remaining at 95% and above; reapplied 1L O2 end of session per pt request    Pertinent Vitals/ Pain       Pain Assessment: Faces Faces Pain Scale: Hurts even more Pain Location: LUE with certain movements Pain Descriptors / Indicators: Aching;Discomfort;Sore Pain Intervention(s): Monitored during session;RN gave pain meds during session;Heat applied;Repositioned  Home Living                                          Prior Functioning/Environment               Frequency  Min 2X/week        Progress Toward Goals  OT Goals(current goals can now be found in the care plan section)  Progress towards OT goals: Progressing toward goals  Acute Rehab OT Goals Patient Stated Goal: to get stronger OT Goal Formulation: With patient Time For Goal Achievement: 05/02/18 Potential to Achieve Goals: Good ADL Goals Additional ADL Goal #1: pt will demonstrate 2 EC techniques during adls Additional ADL Goal #2: pt will complete adl mod I at sink level with seat position and 2 rest breaks  Plan Discharge plan remains appropriate    Co-evaluation                 AM-PAC PT "6 Clicks" Daily Activity     Outcome Measure   Help from another person eating meals?: None Help from another person taking care of personal grooming?: A Little Help from another person toileting, which includes using toliet, bedpan, or urinal?: A Little Help from another person bathing (including washing, rinsing, drying)?: A Little Help from another person to put on and taking off regular upper body clothing?: None Help from another person to put on and taking off regular lower body clothing?: A Lot 6 Click Score: 19    End of Session Equipment Utilized During Treatment: Gait belt;Other (comment)(rollator)  OT Visit Diagnosis: Unsteadiness on feet (R26.81);Muscle weakness (generalized) (M62.81)   Activity Tolerance Patient tolerated treatment well;Patient limited by fatigue   Patient Left in chair;with call bell/phone within reach   Nurse Communication Mobility status        Time: 1427-1450 OT Time Calculation (min): 23 min  Charges: OT General Charges $OT Visit: 1 Visit OT Treatments $Self Care/Home Management : 8-22 mins  Jocelyn Hill, Jocelyn Hill Pager [phone] Office [phone]    Jocelyn Hill 04/26/2018, 3:53 PM

## 2018-04-26 NOTE — Progress Notes (Signed)
Physical Therapy Treatment Patient Details Name: Jocelyn Hill MRN: [id number] DOB: 11/04/1950 Today's Date: 04/26/2018    History of Present Illness Pt is a 67 y.o. female admitted 04/16/18 with chest pain, leg edema and SOB. Worked up for CHF and elevated creatinine. Temporary femoral HD cath removed 10/22. S/p AVF on 10/25. 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    PT Comments    Patient progressing very slowly towards PT goals. Reports having no energy and pain in LUE. Tolerated short distance ambulation with Min guard assist for safety. Limited due to dizziness and needing to sit. Sp02 remained >95% on RA. Need to instruct pt on how to use rollator and brakes next session. Recommend rollator for safety as pt reports decreased activity tolerance, fatigue and dizziness at home and this would help improve overall safe mobility. Will follow.     Follow Up Recommendations  SNF;Supervision for mobility/OOB     Equipment Recommendations  Other (comment)(rollator (4 wheeled walker))    Recommendations for Other Services       Precautions / Restrictions Precautions Precautions: Fall Restrictions Weight Bearing Restrictions: No    Mobility  Bed Mobility               General bed mobility comments: Up in chair upon PT arrival.   Transfers Overall transfer level: Needs assistance Equipment used: 4-wheeled walker Transfers: Sit to/from Stand Sit to Stand: Min guard         General transfer comment: close min guard for safety; increased time/effort, pt stood from chair x1. Reluctant to use LUE due to pain.  Ambulation/Gait Ambulation/Gait assistance: Min guard Gait Distance (Feet): 18 Feet Assistive device: 4-wheeled walker Gait Pattern/deviations: Step-through pattern;Decreased stride length;Trunk flexed Gait velocity: Decreased   General Gait Details: Slow, mildly unsteady gait with rollator; Min guard assist for safety-  dizziness reported requiring need to sit. Sp02 >90% on RA.   Stairs             Wheelchair Mobility    Modified Rankin (Stroke Patients Only)       Balance Overall balance assessment: Needs assistance Sitting-balance support: Feet supported;No upper extremity supported Sitting balance-Leahy Scale: Good Sitting balance - Comments: total A to donn socks due to LUE pain.   Standing balance support: During functional activity Standing balance-Leahy Scale: Poor Standing balance comment: reliant on UE support/external assist for balance                            Cognition Arousal/Alertness: Awake/alert Behavior During Therapy: Flat affect;WFL for tasks assessed/performed Overall Cognitive Status: Within Functional Limits for tasks assessed                                 General Comments: slow to respond at times, though suspect more due to fatigue      Exercises Hand Exercises Digit Composite Flexion: AROM;5 reps;Left Composite Extension: AROM;Left;5 reps    General Comments General comments (skin integrity, edema, etc.): VSS. Pt on RA initially ranging from 89-90%, increased to mid 90s during activity.       Pertinent Vitals/Pain Pain Assessment: Faces Faces Pain Scale: Hurts even more Pain Location: LUE with certain movements Pain Descriptors / Indicators: Aching;Discomfort;Sore Pain Intervention(s): Monitored during session;Repositioned;RN gave pain meds during session;Limited activity within patient's tolerance;Heat applied    Home Living  Prior Function            PT Goals (current goals can now be found in the care plan section) Acute Rehab PT Goals Patient Stated Goal: to get stronger Progress towards PT goals: Progressing toward goals(slowly)    Frequency    Min 2X/week      PT Plan Current plan remains appropriate    Co-evaluation              AM-PAC PT "6 Clicks" Daily  Activity  Outcome Measure  Difficulty turning over in bed (including adjusting bedclothes, sheets and blankets)?: A Little Difficulty moving from lying on back to sitting on the side of the bed? : A Little Difficulty sitting down on and standing up from a chair with arms (e.g., wheelchair, bedside commode, etc,.)?: A Little Help needed moving to and from a bed to chair (including a wheelchair)?: A Little Help needed walking in hospital room?: A Little Help needed climbing 3-5 steps with a railing? : A Lot 6 Click Score: 17    End of Session Equipment Utilized During Treatment: Gait belt Activity Tolerance: Patient limited by fatigue;Other (comment)(dizziness) Patient left: Other (comment)(with OT present sitting on rollator seat) Nurse Communication: Mobility status PT Visit Diagnosis: Other abnormalities of gait and mobility (R26.89);Muscle weakness (generalized) (M62.81)     Time: 1410-1430 PT Time Calculation (min) (ACUTE ONLY): 20 min  Charges:  $Therapeutic Exercise: 8-22 mins                     Wray Kearns, [place], DPT Acute Rehabilitation Services Pager [phone] Office [place] 04/26/2018, 4:04 PM

## 2018-04-26 NOTE — Progress Notes (Signed)
Patient has follow-up appt in the [facility] on 05/05/18 at 3pm.

## 2018-04-26 NOTE — Progress Notes (Signed)
Initial Nutrition Assessment  DOCUMENTATION CODES:   Severe malnutrition in context of chronic illness, Underweight  INTERVENTION:    Nepro Shake po TID, each supplement provides 425 kcal and 19 grams protein  Renal MVI daily  NUTRITION DIAGNOSIS:   Moderate Malnutrition related to chronic illness(CKD, CHF) as evidenced by moderate fat depletion, mild muscle depletion, moderate muscle depletion.  GOAL:   Patient will meet greater than or equal to 90% of their needs  MONITOR:   PO intake, Supplement acceptance, Labs, Skin, I & O's  REASON FOR ASSESSMENT:   Malnutrition Screening Tool    ASSESSMENT:   67 yo female with PMH of CKD-4, GERD, HLD, HTN, DM, hypothyroidism, CHF, CAD, and mitral valve replacement who was admitted on 10/18 with elevated creatinine / acute renal failure, chest pain, HF exacerbation. Started HD 10/26 for T-Th-Sat schedule.   Patient reports that she has been eating poorly since admission. PTA she had lost quite a bit of weight. 150 lbs 1 year ago, 121 lbs now. ~19% weight loss within the past year; patient suspects it was related to fluids. She tried and likes berry flavored Nepro shakes. She is having some pain and that is causing decreased appetite.   Labs reviewed. Sodium 134 (L), BUN 34 (H), creatinine 5.37 (H) Medications reviewed and include B-complex vitamin with C, TUMS, Lasix, Miralax.   Per review of weight encounters, patient has lost 9% of usual weight within the past month. Suspect this is related to volume fluctuations with CKD and CHF.   Patient is new to HD, but not appropriate for a restrictive diet or diet education, given moderate malnutrition.   NUTRITION - FOCUSED PHYSICAL EXAM:    Most Recent Value  Orbital Region  Moderate depletion  Upper Arm Region  Moderate depletion  Thoracic and Lumbar Region  Moderate depletion  Buccal Region  Moderate depletion  Temple Region  Moderate depletion  Clavicle Bone Region  Moderate  depletion  Clavicle and Acromion Bone Region  Severe depletion  Scapular Bone Region  Mild depletion  Dorsal Hand  Mild depletion  Patellar Region  Mild depletion  Anterior Thigh Region  Mild depletion  Posterior Calf Region  Mild depletion  Edema (RD Assessment)  None  Hair  Reviewed  Eyes  Reviewed  Mouth  Reviewed  Skin  Reviewed  Nails  Reviewed       Diet Order:   Diet Order            Diet renal with fluid restriction Fluid restriction: 1200 mL Fluid; Room service appropriate? Yes; Fluid consistency: Thin  Diet effective now              EDUCATION NEEDS:   Not appropriate for education at this time  Skin:  Skin Assessment: Skin Integrity Issues: Skin Integrity Issues:: Incisions Incisions: multiple surgical incisions  Last BM:  10/23  Height:   Ht Readings from Last 1 Encounters:  04/17/18 5\' 9"  (1.753 m)    Weight:   Wt Readings from Last 1 Encounters:  04/26/18 55.2 kg    Ideal Body Weight:  65.9 kg  BMI:  Body mass index is 17.97 kg/m.  Estimated Nutritional Needs:   Kcal:  1650-1900  Protein:  75-90 gm  Fluid:  UOP + 1 L    Molli Barrows, RD, LDN, CNSC Pager [phone] After Hours Pager [phone]

## 2018-04-26 NOTE — Progress Notes (Signed)
[place] for heparin > warfarin Indication: atrial fibrillation, prosthetic mitral valve  Allergies  Allergen Reactions  . Ace Inhibitors Anaphylaxis and Swelling  . Motrin Ib [Ibuprofen] Anaphylaxis    Patient Measurements: Height: 5\' 9"  (175.3 cm) Weight: 121 lb 11.1 oz (55.2 kg) IBW/kg (Calculated) : 66.2 Heparin Dosing Weight: 65.2 kg   Vital Signs: Temp: 98.2 F (36.8 C) (10/28 0621) Temp Source: Oral (10/28 8366) BP: 137/77 (10/28 0846)  Labs: Recent Labs    04/24/18 0500 04/24/18 0715 04/24/18 1921 04/25/18 0516 04/25/18 0738 04/26/18 0648 04/26/18 0711  HGB  --  9.5*  --  9.7*  --  10.3*  --   HCT  --  30.0*  --  30.7*  --  32.3*  --   PLT  --  301  --  289  --  299  --   LABPROT  --  16.3*  --   --  18.0*  --  22.0*  INR  --  1.33  --   --  1.51  --  1.95  HEPARINUNFRC  --   --  0.33 0.35  --  0.49  --   CREATININE 6.34*  --   --  4.14*  --  5.37*  --     Estimated Creatinine Clearance: 9 mL/min (A) (by C-G formula based on SCr of 5.37 mg/dL (H)).   Medical History: Past Medical History:  Diagnosis Date  . Anemia    low iron  . Anxiety   . Arthritis   . Bronchitis   . CHF (congestive heart failure) ([place])   . CKD (chronic kidney disease) stage 4, GFR 15-29 ml/min (HCC) 03/16/2017  . Coronary artery disease   . Depression   . Diabetes mellitus without complication ([place])   . Diet-controlled diabetes mellitus ([place])   . Ganglion cyst    left wrist  . GERD (gastroesophageal reflux disease)   . Headache    chronic headaches  . Heart failure ([place])   . Hyperlipidemia   . Hypertension   . Hypothyroidism   . Mitral regurgitation   . Myocardial infarction ([place])    3x last one 2008  . PAF (paroxysmal atrial fibrillation) ([place])   . Pneumonia    x 2  . S/P Maze operation for atrial fibrillation 02/18/2018   Complete bilateral atrial lesion set using bipolar radiofrequency and cryothermy ablation with clipping of  LA appendage  . S/P mitral valve replacement with bioprosthetic valve 02/18/2018   [facility] Mitral stented bovine pericardial tissue valve Model 7300 TFX Serial # [id number] Size 29  . Stroke [facility])    no lasting residual - ? 2014  . Umbilical hernia     Assessment: 38 yoF on warfarin PTA for Afib - was on hold planning to undergo procedures. Pt reported home regimen as 2.5 mg daily (last dose 10/17 PM).  S/P AVG and temp HD access 10/25. Heparin stopped d/t oozing.  Restarted per FMTS 10/26.  Heparin drip 100  Uts/hr HL 0.49 at goal.   D/w FMTS 10/26 - Patient with afib and new (48m) mitral prosthetic valve. Patient classified as high risk, will restart heparin bridge until INR closer to goal.  INR 1.95 approaching goal after boost warfarin 4mg  x1 and 5mg  x2 - will restart home dose  Goal of Therapy:  INR 2.5-3- per DC TCTS in 8/19 Heparin level 0.3-0.7 units/ml Monitor platelets by anticoagulation protocol: Yes   Plan:  Continue heparin at 1100 units/hr  Warfarin 2.5mg  PO daily  Daily heparin level, INR, CBC Monitor  for s/sx of bleeding  Bonnita Nasuti Pharm.D. CPP, BCPS Clinical Pharmacist [phone] 04/26/2018 9:47 AM

## 2018-04-26 NOTE — Progress Notes (Addendum)
Family Medicine Teaching Service Daily Progress Note Intern Pager: [phone]  Patient name: Jocelyn Hill Medical record number: [id number] Date of birth: Oct 22, 1950 Age: 67 y.o. Gender: female  Primary Care Provider: [name], MD Consultants: VVS, Nephrology  Code Status: FULL  Pt Overview and Major Events to Date:  04/17/18 - Admitted to [facility]  04/18/18 - Echo EF 30-35% 04/19/18 - Temporary HD cath placed  04/20/18 - Temp cath removed 04/23/18 - L arm graft and TDC, Heparin-Warfarin bridge begun 04/24/18 - HD  Assessment and Plan: MORNA FLUD a 67 y.o.femalepresenting from cardiology officewith elevated creatinine and chest pain. PMH is significant forCHF, CKD IV, HTN,CAD s/p MI and CABG (01/2018),hypothyroidism, paroxysmalA-fibs/p MAZE (01/2018),mitral regurgitation s/p mitral valve replacement (01/2018), microcytic anemia, anxiety/depression.  Left arm Swelling: Diffuse left arm swelling appears similar to yesterday. Pain managed well with Dilaudid 2mg  q4 hours PRN. Received 4 doses ON ~q4hours. Patient can still open and close her hand. Pulses harder to palpate today. Thrill appreciated at graft. Fingers felt somewhat colder on the L>R. No significant palor appreciated. VVS consulted and opted to continue to monitor. Some concern for steal syndrome due to poikilothermia of left fingers, delayed capillary refill, and difficulty to access radial pulse. Will continue to monitor slowly. Will begin weaning pain meds slowly. - VVS following, appreciate recs - Discontinue Dilaudid 2mg   - continue Dilaudid 1mg  q4hr - Schedule Tylenol q6 hours - K-pad - Will begin to monitor closely  Acute on Chronic HFrEF: Patient transitioned to PO lasix on 10/24. HD access placed 10/25. HD on 10/26. On PO Lasix on alternative HD days. Next HD on 10/29. UOP ON: 1.5L. Wt down: 54.9> 55.2. Wt on admission 65.2kg.  Denies SOB, trace LE edema bilaterally. CBC unchanged. Doxazosin  discontinued, restarted Amlodipine yesterday. - VVS, cardiology and nephrology following - Daily weights/Strict I&O's - Continue fluid restriction  - continue Amlodpine, coreg, hydralazine, and Imdur  - PT/OT recommend SNF, rolling walker w/ 5" wheels and 3n1 - SNF bed at [facility] approved and pending medical clearance - Follow-up PT/INR, CBC, and renal function  CKD-V now on HD:  Trace LE edema, nephro managing. Right internal jugular vein TDC and left upper arm AV graft. Healing well. HD on 10/26, next HD on 10/29. UOP ON: 1.5L.  On high dose PO lasix on non-HD days. Cr 4.14>5.37, stable electrolytes except Na 137>134. Will continue to monitor.   - Nephrology and VVS following  -Appt with VVS on 11/18 - Continue Lasix 160mg  PO when not on HD (MWFSu) - being clipped for SNF HD - follow renal function labs  HTN: On home hydralazine, amlodipine, Coreg, doxazosin. Doxazosin discontinued. Norvasc restarted. BP stable with some elevated readings throughout the night, 150/77 this morning.  - continue Coreg 25mg  and hydralazine  - Stop Doxazosin - Begin Amlodipine 10mg  QD - Will continue to monitor - Can consider ACE/ARB   History of Mitral regurgitation s/p mitral valve replacement Chronic, stable. 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 Biprosthetic MV replacement in August 2019. Echo this admission, MV stable. Initiated Coumadin/Heparain bridge on 10/25 with goal INR 2.0-3.0, INR today 1.95. Small ooze of blood at tunnel sight but no significant bleeding at either graft or tunnel site. Healing well. Patient notes that Dr. Aundra Dubin managed her INR q2 weeks prior to admission.  -bridge to coumadin with heparin per pharmacy -INR goal 2-3  Atrial Fibrillation s/p MAZE: Chronic, stable.  MAZE procedure in August 2019. No recurrence since.HR: 63-72. - warfarin per  pharm  Microcytic Anemia: chroic, stable Currently stable at 10.3. Will continue to monitor. Will follow-up neuro recs  concerning electrolyte management now that pt has started HD. -Ferric gluconate 125mg  qMWF with HD, ESA - Vitamin D discontinued 2/2 high calcium  Hypothyroidism:  TSH on 10/26/17: 2.884. On Synthroid. - continue home dose  GERD: - protonix 40mg  BID  Anxiety/Depression: Chronic, stable.  On home Wellbutrin, Cymbalta, Xanax PRN, and Trazodone. Cymbalta decreased to 20mg  due to BEERs criteria. Patient tolerating well. Denies HI/SI. - Cymbalta 20mg , Wellbutrin and Trazodone - Xanax PRN qHS anxiety   FEN/GI:- renal diet, PPI Prophylaxis:Warfarin  Disposition: continued inpatient stay for anticoagulation management  Subjective:  Patient appears more comfortable today. Still experiencing a lot of pain in her left arm. The Dilaudid and heating pad did help. She notes not having a bowel movement for 4 days. Tolerating foods well. Some nausea. Noted family came to see her this weekend and that helped her spirits.  Objective: Temp:  [97.8 F (36.6 C)-98.3 F (36.8 C)] 98.3 F (36.8 C) (10/28 1300) Pulse Rate:  [66-72] 66 (10/28 1300) Resp:  [16-19] 19 (10/28 1300) BP: (114-150)/(68-79) 114/68 (10/28 1500) SpO2:  [98 %-100 %] 98 % (10/28 0846) Weight:  [55.2 kg] 55.2 kg (10/28 4665) Physical Exam:  Gen:  Uncomfortable during exam, but not tearful.  Skin: Warm and dry. No obvious rashes, lesions, or trauma. Right IJV TDC without erythema, edema, or warmth. Left arm diffusely swollen. Graft site appears well without erythema or warmth. Skin along arm does not appear to be tight. Palpable thrill appreciated over graft site.  CV: RRR.   <2s capillary refill bilaterally. Weak palpable left radial pulse. Trace LE edema Resp: CTAB.  No wheezing, rales, abnormal lung sounds.  No increased WOB.  Extremities: Left arm diffusely painful, not as painful to palpation compared to yesterday. Left hand/fingers felt cooler to touch than right. Delayed cap refill in left  fingers.  Laboratory: Recent Labs  Lab 04/24/18 0715 04/25/18 0516 04/26/18 0648  WBC 9.2 9.7 9.3  HGB 9.5* 9.7* 10.3*  HCT 30.0* 30.7* 32.3*  PLT 301 289 299   Recent Labs  Lab 04/24/18 0500 04/25/18 0516 04/26/18 0648  NA 136 137 134*  K 3.6 3.5 3.7  CL 99 97* 98  CO2 24 26 23   BUN 43* 24* 34*  CREATININE 6.34* 4.14* 5.37*  CALCIUM 10.6* 10.2 10.5*  GLUCOSE 101* 134* 117*    Imaging/Diagnostic Tests: No results found.  Mina Marble [place], DO 04/26/2018, 4:46 PM PGY-1, [place] Intern pager: [phone], text pages welcome

## 2018-04-26 NOTE — Care Management Important Message (Signed)
Important Message  Patient Details  Name: Jocelyn Hill MRN: [id number] Date of Birth: Mar 29, 1951   Medicare Important Message Given:  Yes    [name] P [name] 04/26/2018, 2:28 PM

## 2018-04-26 NOTE — Progress Notes (Signed)
CSW sent updated PT/OT notes to [facility]. Facility to obtain new [facility] authorization for patient, as auth received last week has now expired. Patient will require insurance auth before admitting to the SNF.  SNF will require patient to be on MWF HD schedule in order to transport her to outpatient HD. CSW to coordinate with inpatient dialysis regarding patient's schedule.  CSW to follow and support.  Jocelyn Hill, [place]

## 2018-04-26 NOTE — Progress Notes (Signed)
[facility] Progress Note    Pt is a 67 y.o. yo female with advanced CKD at baseline who was admitted on 04/16/2018 with what appeared to be uremic symptoms   Assessment/ Plan:   1. Renal- felt to be uremic but BUN only 50's- s/p temp cath and HD on 10/20- was planning for another treatment 10/21 however,  was making good urine and BUN only 26- I decided to observe, numbers have  worsened since - removed femoral temp cath.  Definitely felt she was at risk of needing dialysis again soon given trend- crt nearly 7.  Now s/p AVG and TDC on 10/25 as well for [place] just go ahead and initiate HD.  I did speak with daughter [name] regarding all of this- running TTS for now , being clipped to OP unit.   next HD is Tuesday. - AVG with good bruit - still has edema as would be expected and c/o pain in the left arm. - Next HD Tuesday.  2. Anemia- hgb trending up without transfusion- 9.7 today - on iron, have added ESA   3. Secondary hyperparathyroidism- has been on nutritional vit D only - phos OK , pth 128.  Calcium now high- will stop vit D 4. HTN/volume- BP high- as OP on amlodipine, coreg, cardura, hydralazine and high dose diuretics - BP much better as volume status is better.  Have changed lasix to PO (making between 500 and 900 daily ) and stopped norvasc so BP does not get too low- UF with HD 5. Dispo- has access - once clipped to OP unit will be able to be discharged from my standpoint- hopefully sometime this week - pt has not done well emotionally with this transition but understands the need for hd. Encouraged her to get out of bed to chair at minimum.  Subjective:   C/o left arm pain but denies f/c/n/v. Mild dyspnea.   Objective:   BP 137/77 (BP Location: Right Arm)   Pulse 72   Temp 98.2 F (36.8 C) (Oral)   Resp 17   Ht 5\' 9"  (1.753 m)   Wt 55.2 kg   SpO2 98%   BMI 17.97 kg/m   Intake/Output Summary (Last 24 hours) at 04/26/2018 1138 Last data filed at 04/26/2018  0900 Gross per 24 hour  Intake 427.04 ml  Output 950 ml  Net -522.96 ml   Weight change: -1.8 kg  Physical Exam: General: flat, - difficult to read- in pain Heart: RRR Lungs: mostly clear- dec BS at extreme bases Abdomen: distended but better Extremities:  Really no periph edema Dialysis Access: TDC on right and AVG on left - good bruit - some coolness to left hand; left arm is mildly swollen   Imaging: No results found.  Labs: BMET Recent Labs  Lab 04/21/18 0408 04/22/18 0431 04/23/18 0505 04/23/18 1649 04/24/18 0500 04/25/18 0516 04/26/18 0648  NA 137 137 137 137 136 137 134*  K 3.4* 3.4* 3.2* 3.7 3.6 3.5 3.7  CL 100 100 97* 97* 99 97* 98  CO2 26 25 28 26 24 26 23   GLUCOSE 100* 106* 103* 123* 101* 134* 117*  BUN 37* 40* 41* 40* 43* 24* 34*  CREATININE 5.48* 5.74* 6.38* 6.32* 6.34* 4.14* 5.37*  CALCIUM 9.8 10.0 10.3 10.4* 10.6* 10.2 10.5*  PHOS 5.0* 5.0* 4.9* 5.2* 5.0* 3.4 4.3   CBC Recent Labs  Lab 04/23/18 1649 04/24/18 0715 04/25/18 0516 04/26/18 0648  WBC 8.3 9.2 9.7 9.3  HGB 10.2* 9.5*  9.7* 10.3*  HCT 31.3* 30.0* 30.7* 32.3*  MCV 74.2* 74.3* 73.6* 72.9*  PLT 285 301 289 299    Medications:    . acetaminophen  650 mg Oral Q6H  . amLODipine  10 mg Oral Daily  . aspirin EC  81 mg Oral Daily  . atorvastatin  80 mg Oral Daily  . B-complex with vitamin C  1 tablet Oral Daily  . buPROPion  100 mg Oral TID  . busPIRone  15 mg Oral BID  . calcium carbonate  1 tablet Oral Daily  . carvedilol  25 mg Oral BID  . Chlorhexidine Gluconate Cloth  6 each Topical Q0600  . Chlorhexidine Gluconate Cloth  6 each Topical Q0600  . Chlorhexidine Gluconate Cloth  6 each Topical Q0600  . darbepoetin (ARANESP) injection - NON-DIALYSIS  60 mcg Subcutaneous Q Wed-1800  . DULoxetine  20 mg Oral Daily  . furosemide  160 mg Oral Q M,W,F,Su-1800  . hydrALAZINE  100 mg Oral Q8H  . isosorbide mononitrate  120 mg Oral Daily  . levothyroxine  112 mcg Oral QAC breakfast  .  montelukast  10 mg Oral Daily  . pantoprazole  40 mg Oral BID AC  . polyethylene glycol  17 g Oral Daily  . traZODone  25 mg Oral QHS  . warfarin  2.5 mg Oral q1800  . Warfarin - Pharmacist Dosing Inpatient   Does not apply q1800      Otelia Santee, MD 04/26/2018, 11:38 AM

## 2018-04-27 DIAGNOSIS — M79602 Pain in left arm: Secondary | ICD-10-CM

## 2018-04-27 LAB — CBC
HCT: 30.3 % — ABNORMAL LOW (ref 36.0–46.0)
Hemoglobin: 9.5 g/dL — ABNORMAL LOW (ref 12.0–15.0)
MCH: 23 pg — ABNORMAL LOW (ref 26.0–34.0)
MCHC: 31.4 g/dL (ref 30.0–36.0)
MCV: 73.4 fL — ABNORMAL LOW (ref 80.0–100.0)
Platelets: 326 10*3/uL (ref 150–400)
RBC: 4.13 MIL/uL (ref 3.87–5.11)
RDW: 15 % (ref 11.5–15.5)
WBC: 9.1 10*3/uL (ref 4.0–10.5)
nRBC: 0 % (ref 0.0–0.2)

## 2018-04-27 LAB — CK TOTAL AND CKMB (NOT AT ARMC)
CK, MB: 1.3 ng/mL (ref 0.5–5.0)
Relative Index: INVALID (ref 0.0–2.5)
Total CK: 24 U/L — ABNORMAL LOW (ref 38–234)

## 2018-04-27 LAB — RENAL FUNCTION PANEL
Albumin: 3.4 g/dL — ABNORMAL LOW (ref 3.5–5.0)
Anion gap: 13 (ref 5–15)
BUN: 42 mg/dL — ABNORMAL HIGH (ref 8–23)
CO2: 25 mmol/L (ref 22–32)
Calcium: 10.3 mg/dL (ref 8.9–10.3)
Chloride: 95 mmol/L — ABNORMAL LOW (ref 98–111)
Creatinine, Ser: 5.86 mg/dL — ABNORMAL HIGH (ref 0.44–1.00)
GFR calc Af Amer: 8 mL/min — ABNORMAL LOW (ref >60)
GFR calc non Af Amer: 7 mL/min — ABNORMAL LOW (ref >60)
Glucose, Bld: 138 mg/dL — ABNORMAL HIGH (ref 70–99)
Phosphorus: 4.5 mg/dL (ref 2.5–4.6)
Potassium: 3.7 mmol/L (ref 3.5–5.1)
Sodium: 133 mmol/L — ABNORMAL LOW (ref 135–145)

## 2018-04-27 LAB — PROTIME-INR
INR: 2.79
Prothrombin Time: 29 seconds — ABNORMAL HIGH (ref 11.4–15.2)

## 2018-04-27 LAB — TROPONIN I: Troponin I: 0.05 ng/mL (ref <0.03)

## 2018-04-27 LAB — HEPARIN LEVEL (UNFRACTIONATED): Heparin Unfractionated: 0.64 IU/mL (ref 0.30–0.70)

## 2018-04-27 MED ORDER — WARFARIN SODIUM 2.5 MG PO TABS
2.5000 mg | ORAL_TABLET | Freq: Once | ORAL | Status: AC
  Administered 2018-04-27: 2.5 mg via ORAL
  Filled 2018-04-27 (×2): qty 1

## 2018-04-27 MED ORDER — HYDROMORPHONE HCL 2 MG PO TABS
1.0000 mg | ORAL_TABLET | Freq: Four times a day (QID) | ORAL | Status: DC | PRN
  Administered 2018-04-27 – 2018-04-28 (×3): 1 mg via ORAL
  Filled 2018-04-27 (×3): qty 1

## 2018-04-27 MED ORDER — ALPRAZOLAM 0.25 MG PO TABS
0.2500 mg | ORAL_TABLET | Freq: Once | ORAL | Status: AC
  Administered 2018-04-27: 0.25 mg via ORAL
  Filled 2018-04-27: qty 1

## 2018-04-27 MED ORDER — ONDANSETRON HCL 4 MG/2ML IJ SOLN
INTRAMUSCULAR | Status: AC
  Filled 2018-04-27: qty 2

## 2018-04-27 MED ORDER — HEPARIN SODIUM (PORCINE) 1000 UNIT/ML IJ SOLN
INTRAMUSCULAR | Status: AC
  Administered 2018-04-27: 3000 [IU]
  Filled 2018-04-27: qty 3

## 2018-04-27 NOTE — Progress Notes (Signed)
[facility] for heparin > warfarin Indication: atrial fibrillation, prosthetic mitral valve  Allergies  Allergen Reactions  . Ace Inhibitors Anaphylaxis and Swelling  . Motrin Ib [Ibuprofen] Anaphylaxis    Patient Measurements: Height: 5\' 9"  (175.3 cm) Weight: 123 lb 14.4 oz (56.2 kg) IBW/kg (Calculated) : 66.2 Heparin Dosing Weight: 65.2 kg   Vital Signs: Temp: 97.6 F (36.4 C) (10/29 0705) Temp Source: Oral (10/29 0705) BP: 152/80 (10/29 1100) Pulse Rate: 70 (10/29 1100)  Labs: Recent Labs    04/25/18 0516 04/25/18 0738 04/26/18 0648 04/26/18 0711 04/27/18 0357  HGB 9.7*  --  10.3*  --  9.5*  HCT 30.7*  --  32.3*  --  30.3*  PLT 289  --  299  --  326  LABPROT  --  18.0*  --  22.0* 29.0*  INR  --  1.51  --  1.95 2.79  HEPARINUNFRC 0.35  --  0.49  --  0.64  CREATININE 4.14*  --  5.37*  --  5.86*    Estimated Creatinine Clearance: 8.4 mL/min (A) (by C-G formula based on SCr of 5.86 mg/dL (H)).   Medical History: Past Medical History:  Diagnosis Date  . Anemia    low iron  . Anxiety   . Arthritis   . Bronchitis   . CHF (congestive heart failure) ([place])   . CKD (chronic kidney disease) stage 4, GFR 15-29 ml/min (HCC) 03/16/2017  . Coronary artery disease   . Depression   . Diabetes mellitus without complication ([place])   . Diet-controlled diabetes mellitus ([place])   . Ganglion cyst    left wrist  . GERD (gastroesophageal reflux disease)   . Headache    chronic headaches  . Heart failure ([place])   . Hyperlipidemia   . Hypertension   . Hypothyroidism   . Mitral regurgitation   . Myocardial infarction ([place])    3x last one 2008  . PAF (paroxysmal atrial fibrillation) ([place])   . Pneumonia    x 2  . S/P Maze operation for atrial fibrillation 02/18/2018   Complete bilateral atrial lesion set using bipolar radiofrequency and cryothermy ablation with clipping of LA appendage  . S/P mitral valve replacement with bioprosthetic  valve 02/18/2018   [facility] Mitral stented bovine pericardial tissue valve Model 7300 TFX Serial # [id number] Size 29  . Stroke [facility])    no lasting residual - ? 2014  . Umbilical hernia     Assessment: 59 yoF on warfarin PTA for Afib - was on hold planning to undergo procedures. Pt reported home regimen as 2.5 mg daily (last dose 10/17 PM).  S/P AVG and temp HD access 10/25. Heparin stopped d/t oozing. Restarted per FMTS 10/26.   INR up 2.7 today.  Heparin level remains at goal.  No overt bleeding or complications noted, CBC stable.  Goal of Therapy:  INR 2.5-3- per DC TCTS in 8/19 Monitor platelets by anticoagulation protocol: Yes   Plan:  D/c IV heparin since INR > 2. Warfarin 2.5 mg PO daily  Daily INR Monitor  for s/sx of bleeding  Marguerite Olea, [facility] Clinical Pharmacist Phone [phone]  04/27/2018 11:34 AM

## 2018-04-27 NOTE — Progress Notes (Signed)
CALL PAGER [phone] for any questions or notifications regarding this patient   FMTS Attending Daily Note: Jocelyn Singh, MD  Pager [phone]  Office [phone] I have seen and examined this patient, reviewed their chart. I have discussed this patient with the resident. I agree with the resident's findings, assessment and care plan. INR therapeutic, discontinue heparin. Appreciate Nephrology input. Will identify appropriate SNF and plan accordingly.   Family Medicine Teaching Service Daily Progress Note Intern Pager: [phone]  Patient name: Jocelyn Hill Medical record number: [id number] Date of birth: 04/13/1951 Age: 67 y.o. Gender: female  Primary Care Provider: [name], MD Consultants: VVS, Nephrology  Code Status: FULL  Pt Overview and Major Events to Date:  04/17/18 - Admitted to [facility]  04/18/18 - Echo EF 30-35% 04/19/18 - Temporary HD cath placed  04/20/18 - Temp cath removed 04/23/18 - L arm graft and TDC, Heparin-Warfarin bridge begun 04/24/18 - HD 04/27/18 - HD  Assessment and Plan: Jocelyn Hill a 67 y.o.femalepresenting from cardiology officewith elevated creatinine and chest Hill. PMH is significant forCHF, CKD IV, HTN,CAD s/p MI and CABG (01/2018),hypothyroidism, paroxysmalA-fibs/p MAZE (01/2018),mitral regurgitation s/p mitral valve replacement (01/2018), microcytic anemia, anxiety/depression.  Left arm Swelling: Diffuse left arm swelling appears improved. Hill managed with Dilaudid 1mg  q4hrs and scheduled tylenol. Received 3 doses ON, more spaced out than day prior. However, did have to receive 1 dose of Morphine ON. Today patient notes Hill improving slowly.  Patient can still open and close her hand **. Pulses appreciated. Thrill appreciated at graft.  - VVS following, appreciate recs - Schedule Tylenol q6 hours - K-pad - Will begin to monitor closely - Hydromorphone 1 mg q6h , plan to stop tomorrow   AKI now on HD, appreciate Nephrology input.  TThS schedule currently.    Acute on Chronic HFrEF: Patient transitioned to PO lasix on 10/24. HD access placed 10/25. HD on 10/26. On PO Lasix on alternative HD days. Next HD today. UOP ON: 1.5 L s/p 160mg  PO lasix (given on non-HD days). Wt up: 55.2> 55.7. Wt on admission 65.2kg. Denies SOB, trace LE edema bilaterally improved. - VVS, cardiology and nephrology following - Daily weights/Strict I&O's - Continue fluid restriction  - continue Amlodpine, coreg, hydralazine, and Imdur  - PT/OT recommend SNF, rolling walker w/ 5" wheels and 3n1 - SNF bed at [facility] approved and pending medical clearance - Follow-up PT/INR, CBC, and renal function  HTN: On home hydralazine, amlodipine, Coreg, doxazosin. Doxazosin discontinued. Norvasc restarted. BP more stable ON, slightly elevated this morning 141/75. - continue Coreg 25mg  and hydralazine  - Continue Amlodipine 10mg  QD - Will continue to monitor - Can consider ACE/ARB   History of Mitral regurgitation s/p mitral valve replacement Chronic, stable. 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 Biprosthetic MV replacement in August 2019. Echo this admission, MV stable. Initiated Coumadin/Heparain bridge on 10/25 with goal INR 2.0-3.0, INR today 2.79. No bleeding at graft or tunnel site. Healing well. Patient notes that Dr. Aundra Hill managed her INR q2 weeks prior to admission.  -bridge to coumadin with heparin per pharmacy -INR goal 2-3  Atrial Fibrillation s/p MAZE: Chronic, stable.  MAZE procedure in August 2019. No recurrence since.HR: 60's. - warfarin per pharm - Carvedilol as above   Microcytic Anemia: chronic, stable Currently stable at 9.5. Will continue to monitor. Will follow-up neuro recs concerning electrolyte management now that pt has started HD. -Ferric gluconate 125mg  qMWF with HD, ESA - Vitamin D discontinued 2/2 high calcium  Hypothyroidism:  TSH on 10/26/17: 2.884. On Synthroid. - continue home dose  GERD: - protonix  40mg  BID  Anxiety/Depression: Chronic, stable.  On home Wellbutrin, Cymbalta, Xanax PRN, and Trazodone. Cymbalta decreased to 20mg  due to BEERs criteria. Patient tolerating well. Denies HI/SI. - Cymbalta 20mg , Wellbutrin and Trazodone - Xanax PRN qHS anxiety   FEN/GI:- renal diet, PPI Prophylaxis:Warfarin  Disposition: continued inpatient stay for anticoagulation management  Subjective:  Patient reports improvement in Hill control. Reports chest Hill improved this AM. No Hill over tunnelled line.   Objective: Temp:  [97.6 F (36.4 C)-98.2 F (36.8 C)] 97.8 F (36.6 C) (10/29 1117) Pulse Rate:  [57-75] 75 (10/29 1610) Resp:  [14-21] 18 (10/29 1208) BP: (129-174)/(67-87) 143/81 (10/29 1610) SpO2:  [98 %-100 %] 98 % (10/29 1610) Weight:  [54 kg-56.2 kg] 54 kg (10/29 1117) Physical Exam:  Gen:  Calm, pleasant   Skin: Warm and dry. No obvious rashes, lesions, or trauma. Right IJV TDC without erythema, edema, or warmth. Left arm with moderate edema, soft,. Graft site appears well without erythema or warmth. Skin along arm does not appear to be tight. Palpable thrill appreciated over graft site.  CV: RRR.   <2s capillary refill bilaterally. Weak palpable left radial pulse. Trace LE edema Resp: CTAB.  No wheezing, rales, abnormal lung sounds.  No increased WOB.  Extremities: Left arm diffusely painful, not as painful to palpation compared to yesterday. Left hand/fingers felt cooler to touch than right. Delayed cap refill in left fingers.  Laboratory: Recent Labs  Lab 04/25/18 0516 04/26/18 0648 04/27/18 0357  WBC 9.7 9.3 9.1  HGB 9.7* 10.3* 9.5*  HCT 30.7* 32.3* 30.3*  PLT 289 299 326   Recent Labs  Lab 04/25/18 0516 04/26/18 0648 04/27/18 0357  NA 137 134* 133*  K 3.5 3.7 3.7  CL 97* 98 95*  CO2 26 23 25   BUN 24* 34* 42*  CREATININE 4.14* 5.37* 5.86*  CALCIUM 10.2 10.5* 10.3  GLUCOSE 134* 117* 138*    Imaging/Diagnostic Tests: No results found.  Jocelyn Marble, DO  PGY-1, [place] Intern pager: [phone], text pages welcome

## 2018-04-27 NOTE — Progress Notes (Signed)
[facility] Progress Note    Assessment/ Plan:   67 y.o.yo femalewith advanced CKD at baseline who was admitted on 10/18/2019with what appeared to be uremic symptoms   1. Renal- felt to be uremic but BUN only 50's- s/p temp cath and HD on 10/20- was planning for another treatment 10/21 however, was making good urine and BUN only 26- I decided to observe, numbers have worsened since - removed femoral temp cath. Definitely felt she was at risk of needing dialysis again soon given trend- crt nearly 7. Now s/p AVG and TDC on 10/25 as well for [place] just go ahead and initiate HD. I did speak with daughter [name] regarding all of this- running TTS for now, being clipped to OP unit.  -next HD is Thurday.  -AVG with good bruit; still has edema as would be expected and c/o pain in the left arm.  - Seen HD Today @ 925AM 159/78 UF 2L net 3K/2.5Ca bath Tolerating; no changes for now.  2. Anemia- hgb trending up without transfusion- 9.7today - on iron, have added ESA  3. Secondary hyperparathyroidism- has been on nutritional vit D only - phos OK , pth 128. Calcium now high- will stop vit D 4. HTN/volume- BP high- as OP on amlodipine, coreg, cardura, hydralazine and high dose diuretics - BP much better as volume status is better. Have changed lasix to PO (making between 500 and 900 daily )and stopped norvasc so BP does not get too low- UF with HD 5. Dispo- has access - once clipped to OP unit will be able to be discharged from my standpoint- hopefully sometime this week- pt has not done well emotionally with this transition but understands the need for hd. Encouraged her to get out of bed to chair at minimum. -d/w primary and there may be a request to obtain a MWF spot but until decision is made we are currently looking for a TTS spot to facilitate rehab sessions.  Subjective:   Still c/o left arm pain but denies f/c/n/v. Mild dyspnea. Good appetite.   Objective:   BP  (!) 159/78   Pulse 60   Temp 97.6 F (36.4 C) (Oral)   Resp 14   Ht 5\' 9"  (1.753 m)   Wt 56.2 kg   SpO2 99%   BMI 18.30 kg/m   Intake/Output Summary (Last 24 hours) at 04/27/2018 0931 Last data filed at 04/27/2018 0550 Gross per 24 hour  Intake 359.13 ml  Output 1450 ml  Net -1090.87 ml   Weight change: 0.502 kg  Physical Exam: General: flat, - in mild pain, cooperative Heart: RRR Lungs: mostly clear- dec BS at extreme bases Abdomen: distended but better, +BS Extremities: Really no periph edema Dialysis Access: TDC on right and AVG on left - good bruit- some coolness to left hand; left arm is mildly swollen   Imaging: No results found.  Labs: BMET Recent Labs  Lab 04/22/18 0431 04/23/18 0505 04/23/18 1649 04/24/18 0500 04/25/18 0516 04/26/18 0648 04/27/18 0357  NA 137 137 137 136 137 134* 133*  K 3.4* 3.2* 3.7 3.6 3.5 3.7 3.7  CL 100 97* 97* 99 97* 98 95*  CO2 25 28 26 24 26 23 25   GLUCOSE 106* 103* 123* 101* 134* 117* 138*  BUN 40* 41* 40* 43* 24* 34* 42*  CREATININE 5.74* 6.38* 6.32* 6.34* 4.14* 5.37* 5.86*  CALCIUM 10.0 10.3 10.4* 10.6* 10.2 10.5* 10.3  PHOS 5.0* 4.9* 5.2* 5.0* 3.4 4.3 4.5   CBC  Recent Labs  Lab 04/24/18 0715 04/25/18 0516 04/26/18 0648 04/27/18 0357  WBC 9.2 9.7 9.3 9.1  HGB 9.5* 9.7* 10.3* 9.5*  HCT 30.0* 30.7* 32.3* 30.3*  MCV 74.3* 73.6* 72.9* 73.4*  PLT 301 289 299 326    Medications:    . acetaminophen  650 mg Oral Q6H  . amLODipine  10 mg Oral Daily  . aspirin EC  81 mg Oral Daily  . atorvastatin  80 mg Oral Daily  . B-complex with vitamin C  1 tablet Oral Daily  . buPROPion  100 mg Oral TID  . busPIRone  15 mg Oral BID  . calcium carbonate  1 tablet Oral Daily  . carvedilol  25 mg Oral BID  . Chlorhexidine Gluconate Cloth  6 each Topical Q0600  . Chlorhexidine Gluconate Cloth  6 each Topical Q0600  . Chlorhexidine Gluconate Cloth  6 each Topical Q0600  . darbepoetin (ARANESP) injection - NON-DIALYSIS  60 mcg  Subcutaneous Q Wed-1800  . DULoxetine  20 mg Oral Daily  . feeding supplement (NEPRO CARB STEADY)  237 mL Oral TID BM  . furosemide  160 mg Oral Q M,W,F,Su-1800  . hydrALAZINE  100 mg Oral Q8H  . isosorbide mononitrate  120 mg Oral Daily  . levothyroxine  112 mcg Oral QAC breakfast  . montelukast  10 mg Oral Daily  . multivitamin  1 tablet Oral QHS  . pantoprazole  40 mg Oral BID AC  . polyethylene glycol  17 g Oral Daily  . senna  2 tablet Oral QHS  . traZODone  25 mg Oral QHS  . Warfarin - Pharmacist Dosing Inpatient   Does not apply q1800      Otelia Santee, MD 04/27/2018, 9:31 AM

## 2018-04-27 NOTE — Progress Notes (Signed)
FPTS Interim Progress Note  S: Received a page from nurse saying patient had 10/10 chest pain. Went to see patient. She endorsed tightness in her chest and some SOB that started when she got up from the toilet. She notes some sweating, but she also gets this when she has anxiety. She noted she would like something for anxiety. Upon questioning, she notes she doesn't typically get chest pain with her anxiety. She noted current chest pain was 8/10  O: BP (!) 143/81 (BP Location: Right Arm)   Pulse 75   Temp 97.8 F (36.6 C) (Oral)   Resp 18   Ht 5\' 9"  (1.753 m)   Wt 54 kg   SpO2 98%   BMI 17.58 kg/m   Gen: Patient appears anxious but nontoxic appearing CV:  RRR, radial pulses 2+ MSK; chest nontender to palpation   A/P: EKG unchanged from prior Will obtain troponins and CK levels 1 dose xanax for anxiety  Jocelyn Hefty, DO 04/27/2018, 4:04 PM PGY-1, [place] pager [phone]   UPDATE: Troponins 0.05, CK-MB WNL. Pain decreased to 1/10.  Will continue to monitor at this time. Patient instructed to inform nurse if chest pain worsens again.   Jocelyn Marble [place], DO 04/27/2018, 6:04 PM PGY-1, [place] Medicine Service pager [phone]

## 2018-04-27 NOTE — Progress Notes (Addendum)
    Heart failure team will sign off as of 04/27/18  HF Medication Recommendations for Home: Coreg 25 mg twice a day  Hydralazine 100 mg tid Imdur 120 mg daily  Continue coumadin.   We will  Set up follow up in HF Clinic.   Amy Clegg NP-C  8:59 AM  Agree with above.   [name] 04/27/2018

## 2018-04-28 LAB — RENAL FUNCTION PANEL
Albumin: 3.2 g/dL — ABNORMAL LOW (ref 3.5–5.0)
Albumin: 3.3 g/dL — ABNORMAL LOW (ref 3.5–5.0)
Anion gap: 13 (ref 5–15)
Anion gap: 9 (ref 5–15)
BUN: 18 mg/dL (ref 8–23)
BUN: 19 mg/dL (ref 8–23)
CO2: 26 mmol/L (ref 22–32)
CO2: 26 mmol/L (ref 22–32)
Calcium: 10.3 mg/dL (ref 8.9–10.3)
Calcium: 10.4 mg/dL — ABNORMAL HIGH (ref 8.9–10.3)
Chloride: 94 mmol/L — ABNORMAL LOW (ref 98–111)
Chloride: 97 mmol/L — ABNORMAL LOW (ref 98–111)
Creatinine, Ser: 3.34 mg/dL — ABNORMAL HIGH (ref 0.44–1.00)
Creatinine, Ser: 3.53 mg/dL — ABNORMAL HIGH (ref 0.44–1.00)
GFR calc Af Amer: 15 mL/min — ABNORMAL LOW (ref >60)
GFR calc Af Amer: 14 mL/min — ABNORMAL LOW (ref >60)
GFR calc non Af Amer: 13 mL/min — ABNORMAL LOW (ref >60)
GFR calc non Af Amer: 12 mL/min — ABNORMAL LOW (ref >60)
Glucose, Bld: 109 mg/dL — ABNORMAL HIGH (ref 70–99)
Glucose, Bld: 118 mg/dL — ABNORMAL HIGH (ref 70–99)
Phosphorus: 3.2 mg/dL (ref 2.5–4.6)
Phosphorus: 3.2 mg/dL (ref 2.5–4.6)
Potassium: 3.8 mmol/L (ref 3.5–5.1)
Potassium: 3.9 mmol/L (ref 3.5–5.1)
Sodium: 133 mmol/L — ABNORMAL LOW (ref 135–145)
Sodium: 132 mmol/L — ABNORMAL LOW (ref 135–145)

## 2018-04-28 LAB — CBC
HCT: 31.2 % — ABNORMAL LOW (ref 36.0–46.0)
HCT: 31.5 % — ABNORMAL LOW (ref 36.0–46.0)
Hemoglobin: 9.8 g/dL — ABNORMAL LOW (ref 12.0–15.0)
Hemoglobin: 10.3 g/dL — ABNORMAL LOW (ref 12.0–15.0)
MCH: 23.2 pg — ABNORMAL LOW (ref 26.0–34.0)
MCH: 24 pg — ABNORMAL LOW (ref 26.0–34.0)
MCHC: 31.4 g/dL (ref 30.0–36.0)
MCHC: 32.7 g/dL (ref 30.0–36.0)
MCV: 73.9 fL — ABNORMAL LOW (ref 80.0–100.0)
MCV: 73.4 fL — ABNORMAL LOW (ref 80.0–100.0)
Platelets: 308 10*3/uL (ref 150–400)
Platelets: 296 10*3/uL (ref 150–400)
RBC: 4.22 MIL/uL (ref 3.87–5.11)
RBC: 4.29 MIL/uL (ref 3.87–5.11)
RDW: 15.2 % (ref 11.5–15.5)
RDW: 15 % (ref 11.5–15.5)
WBC: 8 10*3/uL (ref 4.0–10.5)
WBC: 8.8 10*3/uL (ref 4.0–10.5)
nRBC: 0 % (ref 0.0–0.2)
nRBC: 0 % (ref 0.0–0.2)

## 2018-04-28 LAB — PROTIME-INR
INR: 2.37
Prothrombin Time: 25.6 seconds — ABNORMAL HIGH (ref 11.4–15.2)

## 2018-04-28 LAB — HEPARIN LEVEL (UNFRACTIONATED): Heparin Unfractionated: <0.1 IU/mL — ABNORMAL LOW (ref 0.30–0.70)

## 2018-04-28 MED ORDER — WARFARIN SODIUM 2.5 MG PO TABS
2.5000 mg | ORAL_TABLET | Freq: Every day | ORAL | Status: DC
  Administered 2018-04-28 – 2018-04-30 (×3): 2.5 mg via ORAL
  Filled 2018-04-28 (×3): qty 1

## 2018-04-28 MED ORDER — HYDROMORPHONE HCL 2 MG PO TABS
1.0000 mg | ORAL_TABLET | Freq: Three times a day (TID) | ORAL | Status: DC | PRN
  Administered 2018-04-28 – 2018-04-29 (×2): 1 mg via ORAL
  Filled 2018-04-28: qty 1

## 2018-04-28 NOTE — Progress Notes (Signed)
ANTICOAGULATION CONSULT NOTE - Follow Up Consult  Pharmacy Consult for Coumadin Indication: atrial fibrillation  Allergies  Allergen Reactions  . Ace Inhibitors Anaphylaxis and Swelling  . Motrin Ib [Ibuprofen] Anaphylaxis    Patient Measurements: Height: 5\' 9"  (175.3 cm) Weight: 119 lb 4.8 oz (54.1 kg) IBW/kg (Calculated) : 66.2  Vital Signs: Temp: 97.7 F (36.5 C) (10/30 0323) Temp Source: Oral (10/30 0323) BP: 148/81 (10/30 0622) Pulse Rate: 75 (10/30 0323)  Labs: Recent Labs    04/26/18 0648 04/26/18 0711 04/27/18 0357 04/27/18 1515 04/28/18 0401 04/28/18 0729  HGB 10.3*  --  9.5*  --  9.8*  --   HCT 32.3*  --  30.3*  --  31.2*  --   PLT 299  --  326  --  308  --   LABPROT  --  22.0* 29.0*  --   --  25.6*  INR  --  1.95 2.79  --   --  2.37  HEPARINUNFRC 0.49  --  0.64  --  <0.10*  --   CREATININE 5.37*  --  5.86*  --  3.34*  --   CKTOTAL  --   --   --  24*  --   --   CKMB  --   --   --  1.3  --   --   TROPONINI  --   --   --  0.05*  --   --     Estimated Creatinine Clearance: 14.2 mL/min (A) (by C-G formula based on SCr of 3.34 mg/dL (H)).   Assessment: Anticoag: warfarin PTA for Afib - INR on admit 1.89 - 10/30: INR 2.79> 2.37 today. Hgb stable. Plts stable.  Goal of Therapy:  INR 2-3 Monitor platelets by anticoagulation protocol: Yes   Plan:  Warfarin 2.5 mg q1800 Daily INR  Lindon Kiel S. Alford Highland, PharmD, [facility] Clinical Staff Pharmacist Pager [phone]  [place], [place] 04/28/2018,9:03 AM

## 2018-04-28 NOTE — Progress Notes (Signed)
CSW continuing to follow for discharge planning. Awaiting outpatient dialysis clip. CSW did coordinate with Freda Munro in inpatient dialysis regarding patient's SNF choice and scheduling needs.   Patient will likely need updated PT notes for insurance authorization for SNF. Patient does have [place], but will likely lapse while awaiting HD clip. Did discuss with MD.  CSW to follow and support.  Estanislado Emms, [place]

## 2018-04-28 NOTE — Progress Notes (Signed)
  Progress Note    04/28/2018 2:52 PM 1 Day Post-Op  Subjective:  L arm swelling subjectively a little improved.    Vitals:   04/28/18 0622 04/28/18 1052  BP: (!) 148/81 (!) 150/77  Pulse:    Resp:    Temp:    SpO2:     Physical Exam: NAD TDC right chest wall c/d/i Left upper arm graft with palpable thrill, swelling in arm and forearm, no appreciable hemaotma  CBC    Component Value Date/Time   WBC 8.8 04/28/2018 0835   RBC 4.29 04/28/2018 0835   HGB 10.3 (L) 04/28/2018 0835   HCT 31.5 (L) 04/28/2018 0835   PLT 296 04/28/2018 0835   MCV 73.4 (L) 04/28/2018 0835   MCH 24.0 (L) 04/28/2018 0835   MCHC 32.7 04/28/2018 0835   RDW 15.0 04/28/2018 0835   LYMPHSABS 0.5 (L) 03/03/2018 0415   MONOABS 0.6 03/03/2018 0415   EOSABS 0.7 03/03/2018 0415   BASOSABS 0.1 03/03/2018 0415    BMET    Component Value Date/Time   NA 132 (L) 04/28/2018 0835   K 3.9 04/28/2018 0835   CL 97 (L) 04/28/2018 0835   CO2 26 04/28/2018 0835   GLUCOSE 118 (H) 04/28/2018 0835   BUN 19 04/28/2018 0835   CREATININE 3.53 (H) 04/28/2018 0835   CREATININE 3.63 (H) 01/11/2018 1148   CALCIUM 10.4 (H) 04/28/2018 0835   CALCIUM 9.7 04/21/2017 0231   GFRNONAA 12 (L) 04/28/2018 0835   GFRAA 14 (L) 04/28/2018 0835    INR    Component Value Date/Time   INR 2.37 04/28/2018 0729     Intake/Output Summary (Last 24 hours) at 04/28/2018 1452 Last data filed at 04/28/2018 1200 Gross per 24 hour  Intake 917.49 ml  Output 250 ml  Net 667.49 ml     Assessment/Plan:  67 y.o. female is s/p R IJ TDC placement and L arm AVG placement  Good thrill in graft and TDC working well.  Left arm swelling is significant, but patient feels arm discomfort slightly improved.  Discussed options of sticking graft and performing fistulogram in 4 weeks after graft has time to be incorporated.  Other option would be ligating graft if symptoms not tolerable or coming from groin and doing central venogram.  However on  coumadin and INR therapeutic and would have to reverse, she feels symptoms are manageable for now and will arrange fistulogram in 3-4 weeks.    Marty Heck, MD [facility] Office: [phone] Pager: [place]

## 2018-04-28 NOTE — Care Management Note (Signed)
Case Management Note  Patient Details  Name: AIVAH PUTMAN MRN: [id number] Date of Birth: 1951/06/23  Subjective/Objective: Pt presented for increased Cr. ESRD- New HD and Chest Pain. Awaiting HD Clip to be completed. Plan for SNF once stable. CSW following for disposition needs.                    Action/Plan: CM will continue to monitor for additional needs.   Expected Discharge Date:                  Expected Discharge Plan:  Skilled Nursing Facility  In-House Referral:  Clinical Social Work  Discharge planning Services  CM Consult  Post Acute Care Choice:  NA Choice offered to:  NA  DME Arranged:  N/A DME Agency:  NA  HH Arranged:  NA HH Agency:  NA  Status of Service:  Completed, signed off  If discussed at [place] of Stay Meetings, dates discussed:    Additional Comments:  Bethena Roys, RN 04/28/2018, 4:09 PM

## 2018-04-28 NOTE — Progress Notes (Signed)
[facility] Progress Note    Assessment/ Plan:   67 y.o.yo femalewith advanced CKD at baseline who was admitted on 10/18/2019with what appeared to be uremic symptoms  1. Renal- felt to be uremic but BUN only 50's- s/p temp cath and HD on 10/20- was planning for another treatment 10/21 however, was making good urine and BUN only 26- I decided to observe, numbers have worsened since - removed femoral temp cath. Definitely felt she was at risk of needing dialysis again soon given trend- crt nearly 7. Now s/p AVG and TDC on 10/25 as well for [place] just go ahead and initiate HD. I did speak with daughter Jocelyn Hill regarding all of this- running TTS for now, being clipped to OP unit.  -next HD is Thurday.  -AVG with goodbruit; still hasedema as would be expected and c/o pain in the left arm -> that left arm has been increasing in size over the past few days as well. - Will let VVS know -> per Dr. Carlis Abbott note she may need angiogram in 4 weeks but wonder if femoral approach to eval for central stenosis would be helpful as the weight of that left arm and pain may limit her mobility. She has a left sided pacemaker.  2. Anemia- hgb trending up without transfusion- 10.3today - on iron, have added ESA  3. Secondary hyperparathyroidism- has been on nutritional vit D only - phos OK , pth 128. Calcium now high- will stop vit D - will stop TUMS and recheck phos in 48hrs 4. HTN/volume- BP high- as OP on amlodipine, coreg, cardura, hydralazine and high dose diuretics - BP much better as volume status is better.  stopped norvasc so BP does not get too low- UF with HD  - UOP dropping with HD. Will hold Lasix unless there are issues with IDWG. May always challenge with high dose lasix again.  5. Dispo- has access - once clipped to OP unit will be able to be discharged from my standpoint- hopefullysometime thisweek- pt has not done well emotionally with this transitionbut  understands the need for hd. Encouraged her to get out of bed to chair at minimum. -d/w primary and there may be a request to obtain a MWF spot but until decision is made we are currently looking for a TTS spot to facilitate rehab sessions.  Subjective:   Still c/o left arm pain but denies f/c/n/v. Had CP last night. Described as sharp this am - chest is TTP.    Objective:   BP (!) 150/77   Pulse 75   Temp 97.7 F (36.5 C) (Oral)   Resp 18   Ht 5\' 9"  (1.753 m)   Wt 54.1 kg   SpO2 100%   BMI 17.62 kg/m   Intake/Output Summary (Last 24 hours) at 04/28/2018 1128 Last data filed at 04/28/2018 0400 Gross per 24 hour  Intake 1050 ml  Output 250 ml  Net 800 ml   Weight change: 0.498 kg  Physical Exam: General: flat, - in mild pain, cooperative Heart: RRR, pacer in left side of chest Lungs: mostly clear- dec BS at extreme bases Abdomen: distended but better, +BS Extremities: Really no periph edema Dialysis Access: TDC on right and AVG on left - goodbruit- some coolness to left hand; left arm is markedly swollen  Imaging: No results found.  Labs: BMET Recent Labs  Lab 04/23/18 1649 04/24/18 0500 04/25/18 0516 04/26/18 0648 04/27/18 0357 04/28/18 0401 04/28/18 0835  NA 137 136 137 134* 133*  133* 132*  K 3.7 3.6 3.5 3.7 3.7 3.8 3.9  CL 97* 99 97* 98 95* 94* 97*  CO2 26 24 26 23 25 26 26   GLUCOSE 123* 101* 134* 117* 138* 109* 118*  BUN 40* 43* 24* 34* 42* 18 19  CREATININE 6.32* 6.34* 4.14* 5.37* 5.86* 3.34* 3.53*  CALCIUM 10.4* 10.6* 10.2 10.5* 10.3 10.3 10.4*  PHOS 5.2* 5.0* 3.4 4.3 4.5 3.2 3.2   CBC Recent Labs  Lab 04/26/18 0648 04/27/18 0357 04/28/18 0401 04/28/18 0835  WBC 9.3 9.1 8.0 8.8  HGB 10.3* 9.5* 9.8* 10.3*  HCT 32.3* 30.3* 31.2* 31.5*  MCV 72.9* 73.4* 73.9* 73.4*  PLT 299 326 308 296    Medications:    . acetaminophen  650 mg Oral Q6H  . amLODipine  10 mg Oral Daily  . aspirin EC  81 mg Oral Daily  . atorvastatin  80 mg Oral Daily   . B-complex with vitamin C  1 tablet Oral Daily  . buPROPion  100 mg Oral TID  . busPIRone  15 mg Oral BID  . calcium carbonate  1 tablet Oral Daily  . carvedilol  25 mg Oral BID  . Chlorhexidine Gluconate Cloth  6 each Topical Q0600  . Chlorhexidine Gluconate Cloth  6 each Topical Q0600  . Chlorhexidine Gluconate Cloth  6 each Topical Q0600  . darbepoetin (ARANESP) injection - NON-DIALYSIS  60 mcg Subcutaneous Q Wed-1800  . DULoxetine  20 mg Oral Daily  . feeding supplement (NEPRO CARB STEADY)  237 mL Oral TID BM  . furosemide  160 mg Oral Q M,W,F,Su-1800  . hydrALAZINE  100 mg Oral Q8H  . isosorbide mononitrate  120 mg Oral Daily  . levothyroxine  112 mcg Oral QAC breakfast  . montelukast  10 mg Oral Daily  . multivitamin  1 tablet Oral QHS  . pantoprazole  40 mg Oral BID AC  . polyethylene glycol  17 g Oral Daily  . senna  2 tablet Oral QHS  . traZODone  25 mg Oral QHS  . warfarin  2.5 mg Oral q1800  . Warfarin - Pharmacist Dosing Inpatient   Does not apply q1800      Otelia Santee, MD 04/28/2018, 11:28 AM

## 2018-04-28 NOTE — Progress Notes (Addendum)
CALL PAGER [phone] for any questions or notifications regarding this patient   FMTS Attending Daily Note: Dorris Singh, MD  Pager [phone]  Office [phone] I have seen and examined this patient, reviewed their chart. I have discussed this patient with the resident. I agree with the resident's findings, assessment and care plan. Left arm swelling improved, appreciate VVS input.  Family Medicine Teaching Service Daily Progress Note Intern Pager: [phone]  Patient name: [facility] Medical record number: [id number] Date of birth: [birth year] Age: 67 y.o. Gender: female  Primary Care Provider: [name], MD Consultants: VVS, Nephrology  Code Status: FULL  Pt Overview and Major Events to Date:  04/17/18 - Admitted to [facility]  04/18/18 - Echo EF 30-35% 04/19/18 - Temporary HD cath placed  04/20/18 - Temp cath removed 04/23/18 - L arm graft and TDC, Heparin-Warfarin bridge begun 04/24/18 - HD 04/27/18 - HD, INR at goal 04/28/18 - Medically cleared for discharge  Assessment and Plan: ZYKIRIA BRUENING a 67 y.o.femalepresenting from cardiology officewith elevated creatinine and chest pain. PMH is significant forCHF, CKD now on HD,  HTN,CAD s/p MI and CABG (01/2018),hypothyroidism, paroxysmalA-fibs/p MAZE (01/2018),mitral regurgitation s/p mitral valve replacement (01/2018), microcytic anemia, anxiety/depression.  Chest pain: resolved Overnight, patient endorsed mid-chest pain of 10/10. EKG unchanged from prior studies. Troponin 0.05 with CK-MB WNL. Patient noted feeling anxious. 1 dose Xanax with resolution of pain.  - Will continue to monitor  Left arm Swelling: improving Diffuse left arm swelling appears improved. Pain managed with Dilaudid 1mg  q6hrs and scheduled tylenol. Received 2 doses ON, q6 hours. Today patient notes still painful. Patient can still open and close her hand. Pulses appreciated. Thrill appreciated at graft. Will transition to Dilaudid q 8 hours and  continue scheduled tylenol and monitor pain. - VVS following, appreciate recs - Schedule Tylenol q6 hours - Begin Dilaudid q8hours  - K-pad - Will begin to monitor closely  End stage renal disease on hemodialysis, appreciate Nephrology input. TThS schedule currently. - CSW aware of medical clearance. Patient has a bed at SNF. Dialysis currently working on adjusting HD schedule to MWF. Will follow-up  Acute on Chronic HFrEF: Patient transitioned to PO lasix on 10/24. HD access placed 10/25. HD on 10/29 with removal of 2L. On PO Lasix on alternative HD days. UOP ON: 250cc. Wt up: 55.7> 54.1. Wt on admission 65.2kg. Denies SOB, trace LE edema bilaterally improved. Tolerated HD well. - VVS, cardiology and nephrology following  - Cards signed off - rec continue Coreg 25mg  BID, Hydral 100mg  TID, Imdur 120QD and coumadin  - will set up follow-up in HF clinic - Daily weights/Strict I&O's - Continue fluid restriction  - continue Amlodpine, coreg, hydralazine, and Imdur  - PT/OT recommend SNF, rolling walker w/ 5" wheels and 3n1 - [facility] bed [facility] approved and pending medical clearance - Follow-up PT/INR, CBC, and renal function Am  HTN: On home hydralazine, amlodipine, Coreg, doxazosin. Doxazosin discontinued. Norvasc restarted. BP more stable ON, slightly elevated this morning 148/81.  - continue Coreg 25mg  and hydralazine  - Continue Amlodipine 10mg  QD - Will continue to monitor   History of Mitral regurgitation s/p mitral valve replacement Chronic, stable. 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 Biprosthetic MV replacement in August 2019. Echo this admission, MV stable. Initiated Coumadin/Heparain bridge on 10/25 with goal INR 2.0-3.0, INR at goal 2.79>2.37. Heparin discontinued yesterday. No bleeding at graft or tunnel site. Healing well. Patient notes that Dr. Aundra Dubin managed her INR q2 weeks prior to  admission. Patient currently on 2.5mg  warfarin QD. - INR at goal, patient medically  ready for discharge  Atrial Fibrillation s/p MAZE: Chronic, stable.  MAZE procedure in August 2019. No recurrence since.HR: 60-70's. - warfarin per pharm - Carvedilol as above   Microcytic Anemia: chronic, stable Currently stable at 9.8. Will continue to monitor. Will follow-up neuro recs concerning electrolyte management now that pt has started HD. -Ferric gluconate 125mg  qMWF with HD, ESA - Vitamin D discontinued 2/2 high calcium  Hypothyroidism:  TSH on 10/26/17: 2.884. On Synthroid. - continue home dose  GERD: - protonix 40mg  BID  Anxiety/Depression: Chronic, stable.  On home Wellbutrin, Cymbalta, Xanax PRN, and Trazodone. Cymbalta decreased to 20mg  due to BEERs criteria. Patient tolerating well. Denies HI/SI. - Cymbalta 20mg , Wellbutrin and Trazodone - Xanax PRN qHS anxiety   FEN/GI:- renal diet, PPI Prophylaxis:Warfarin  Disposition: continued inpatient stay for anticoagulation management  Subjective:  Patient reports improvement in pain control. Reports chest pain improved this AM. No pain over tunnelled line. Eating well and had a BM yesterday.   Objective: Temp:  [97.7 F (36.5 C)-98.5 F (36.9 C)] 97.7 F (36.5 C) (10/30 0323) Pulse Rate:  [67-75] 75 (10/30 0323) BP: (134-153)/(77-93) 150/77 (10/30 1052) SpO2:  [97 %-100 %] 100 % (10/30 0323) Weight:  [54.1 kg] 54.1 kg (10/30 0323) Physical Exam:  Gen:  Calm, pleasant , lying in bed  Skin: Warm and dry. No obvious rashes, lesions, or trauma. Right IJV TDC without erythema, edema, or warmth. Left arm with moderate diffuse edema, soft. Graft site appears well without erythema or warmth. Skin along arm does not appear to be tight. Palpable thrill appreciated over graft site.  CV: RRR.   <2s capillary refill bilaterally. Weak palpable left radial pulse. Trace LE edema Resp: CTAB.  No wheezing, rales, abnormal lung sounds.  No increased WOB.  Extremities: Left arm diffusely painful, not as painful to  palpation compared to yesterday  Laboratory: Recent Labs  Lab 04/27/18 0357 04/28/18 0401 04/28/18 0835  WBC 9.1 8.0 8.8  HGB 9.5* 9.8* 10.3*  HCT 30.3* 31.2* 31.5*  PLT 326 308 296   Recent Labs  Lab 04/27/18 0357 04/28/18 0401 04/28/18 0835  NA 133* 133* 132*  K 3.7 3.8 3.9  CL 95* 94* 97*  CO2 25 26 26   BUN 42* 18 19  CREATININE 5.86* 3.34* 3.53*  CALCIUM 10.3 10.3 10.4*  GLUCOSE 138* 109* 118*    Imaging/Diagnostic Tests: No results found.  Mina Marble, DO  PGY-1, [place] Intern pager: [phone], text pages welcome

## 2018-04-29 ENCOUNTER — Other Ambulatory Visit: Payer: Self-pay

## 2018-04-29 DIAGNOSIS — N186 End stage renal disease: Secondary | ICD-10-CM

## 2018-04-29 LAB — RENAL FUNCTION PANEL
Albumin: 3.4 g/dL — ABNORMAL LOW (ref 3.5–5.0)
Albumin: 3.8 g/dL (ref 3.5–5.0)
Anion gap: 12 (ref 5–15)
Anion gap: 12 (ref 5–15)
BUN: 35 mg/dL — ABNORMAL HIGH (ref 8–23)
BUN: 6 mg/dL — ABNORMAL LOW (ref 8–23)
CO2: 27 mmol/L (ref 22–32)
CO2: 24 mmol/L (ref 22–32)
Calcium: 10.2 mg/dL (ref 8.9–10.3)
Calcium: 9.1 mg/dL (ref 8.9–10.3)
Chloride: 95 mmol/L — ABNORMAL LOW (ref 98–111)
Chloride: 99 mmol/L (ref 98–111)
Creatinine, Ser: 4.65 mg/dL — ABNORMAL HIGH (ref 0.44–1.00)
Creatinine, Ser: 1.77 mg/dL — ABNORMAL HIGH (ref 0.44–1.00)
GFR calc Af Amer: 10 mL/min — ABNORMAL LOW (ref >60)
GFR calc Af Amer: 33 mL/min — ABNORMAL LOW (ref >60)
GFR calc non Af Amer: 9 mL/min — ABNORMAL LOW (ref >60)
GFR calc non Af Amer: 29 mL/min — ABNORMAL LOW (ref >60)
Glucose, Bld: 133 mg/dL — ABNORMAL HIGH (ref 70–99)
Glucose, Bld: 190 mg/dL — ABNORMAL HIGH (ref 70–99)
Phosphorus: 4.5 mg/dL (ref 2.5–4.6)
Phosphorus: 1.6 mg/dL — ABNORMAL LOW (ref 2.5–4.6)
Potassium: 3.5 mmol/L (ref 3.5–5.1)
Potassium: 3.5 mmol/L (ref 3.5–5.1)
Sodium: 134 mmol/L — ABNORMAL LOW (ref 135–145)
Sodium: 135 mmol/L (ref 135–145)

## 2018-04-29 LAB — CBC
HCT: 30.2 % — ABNORMAL LOW (ref 36.0–46.0)
HCT: 34.8 % — ABNORMAL LOW (ref 36.0–46.0)
Hemoglobin: 9.7 g/dL — ABNORMAL LOW (ref 12.0–15.0)
Hemoglobin: 11.2 g/dL — ABNORMAL LOW (ref 12.0–15.0)
MCH: 23.7 pg — ABNORMAL LOW (ref 26.0–34.0)
MCH: 23.9 pg — ABNORMAL LOW (ref 26.0–34.0)
MCHC: 32.1 g/dL (ref 30.0–36.0)
MCHC: 32.2 g/dL (ref 30.0–36.0)
MCV: 73.8 fL — ABNORMAL LOW (ref 80.0–100.0)
MCV: 74.4 fL — ABNORMAL LOW (ref 80.0–100.0)
Platelets: 309 10*3/uL (ref 150–400)
Platelets: 205 10*3/uL (ref 150–400)
RBC: 4.09 MIL/uL (ref 3.87–5.11)
RBC: 4.68 MIL/uL (ref 3.87–5.11)
RDW: 15 % (ref 11.5–15.5)
RDW: 15.3 % (ref 11.5–15.5)
WBC: 9.4 10*3/uL (ref 4.0–10.5)
WBC: 9 10*3/uL (ref 4.0–10.5)
nRBC: 0.2 % (ref 0.0–0.2)
nRBC: 0.2 % (ref 0.0–0.2)

## 2018-04-29 LAB — PROTIME-INR
INR: 2.32
Prothrombin Time: 25.2 seconds — ABNORMAL HIGH (ref 11.4–15.2)

## 2018-04-29 MED ORDER — HYDROMORPHONE HCL 2 MG PO TABS
ORAL_TABLET | ORAL | Status: AC
  Administered 2018-04-29: 1 mg via ORAL
  Filled 2018-04-29: qty 1

## 2018-04-29 MED ORDER — DARBEPOETIN ALFA 60 MCG/0.3ML IJ SOSY
60.0000 ug | PREFILLED_SYRINGE | INTRAMUSCULAR | Status: DC
  Administered 2018-04-29: 60 ug via SUBCUTANEOUS
  Filled 2018-04-29: qty 0.3

## 2018-04-29 MED ORDER — TRAMADOL HCL 50 MG PO TABS
50.0000 mg | ORAL_TABLET | Freq: Two times a day (BID) | ORAL | Status: DC | PRN
  Administered 2018-04-29 – 2018-05-01 (×2): 50 mg via ORAL
  Filled 2018-04-29 (×2): qty 1

## 2018-04-29 MED ORDER — ALPRAZOLAM 0.5 MG PO TABS
0.5000 mg | ORAL_TABLET | Freq: Once | ORAL | Status: AC
  Administered 2018-04-29: 0.5 mg via ORAL
  Filled 2018-04-29: qty 1

## 2018-04-29 NOTE — Progress Notes (Signed)
PT Cancellation Note  Patient Details Name: Jocelyn Hill MRN: [id number] DOB: 1951/02/25   Cancelled Treatment:    Reason Eval/Treat Not Completed: Patient at procedure or test/unavailable(Pt was in HD all am.  Will return as able.  )   [name] 04/29/2018, 11:37 AM  [place] Pager:  [phone]  Office:  [phone]

## 2018-04-29 NOTE — Care Management Important Message (Signed)
Important Message  Patient Details  Name: J[facility] MRN: [id number] Date of Birth: 08-May-1951   Medicare Important Message Given:  Yes    Claribel Sachs P Eisen Robenson 04/29/2018, 4:12 PM

## 2018-04-29 NOTE — Progress Notes (Signed)
CSW updated patient's facility that patient clipped to [facility] MWF. Sent facility updated PT notes for insurance authorization. Facility can take patient Saturday afternoon after dialysis treatment at the hospital, and will transport patient to outpatient HD starting 05/03/18. CSW to follow.  Jocelyn Hill, [place]

## 2018-04-29 NOTE — Progress Notes (Signed)
Family Medicine Teaching Service Daily Progress Note Intern Pager: [phone]  Patient name: Jocelyn Hill Medical record number: [id number] Date of birth: [birth year] Age: 67 y.o. Gender: female  Primary Care Provider: [name], MD Consultants: VVS, Nephrology  Code Status: FULL  Pt Overview and Major Events to Date:  04/17/18 - Admitted to [facility]  04/18/18 - Echo EF 30-35% 04/19/18 - Temporary HD cath placed  04/20/18 - Temp cath removed 04/23/18 - L arm graft and TDC, Heparin-Warfarin bridge begun 04/24/18 - HD 04/27/18 - HD, INR at goal 04/29/18 - HD 04/28/18 - Medically cleared for discharge  Assessment and Plan: Jocelyn Hill a 67 y.o.femalepresenting from cardiology officewith elevated creatinine and chest pain. PMH is significant forCHF, CKD now on HD,  HTN,CAD s/p MI and CABG (01/2018),hypothyroidism, paroxysmalA-fibs/p MAZE (01/2018),mitral regurgitation s/p mitral valve replacement (01/2018), microcytic anemia, anxiety/depression.  Left arm Swelling: improving Left arm swelling appears much improved, localized to mid-arm around graft site. Graft site healing well without erythema or warmth. Pain managed with Dilaudid 1mg  q8hrs and scheduled tylenol. Received 1 dose ON. Today patient notes still painful, but improved from day prior. Radial pulses 2+ bilaterally. Thrill appreciated at graft. Patient to arrange fistulogram in 3-4 weeks with VVS. Will discontinue Dilaudid and continue scheduled tylenol.  - VVS following, appreciate recs - Schedule Tylenol q6 hours - Discontinue Dilaudid - K-pad - Will begin to monitor  End stage renal disease on hemodialysis, appreciate Nephrology input. TThS schedule currently, transitioning to MWF. Patient to receive HD on Saturday (11/2) and be discharged to SNF for Monday HD in [place]. - CSW aware of medical clearance. Patient has a bed at SNF. Dialysis adjusted to MWF and accepted at [facility] kidney center for first  treatment on 11/4  Acute on Chronic HFrEF: Patient ESRD on HD. Decreased urine output since initiating HD on 10/25. Nehpro following. Will hold Lasix at this time, may attempt challenging in the future. UOP ON: 0.6L. HD scheduled for today. Wt: 54.5>53. Wt on admission 65.2kg. Denies SOB, trace LE edema bilaterally improved.  - VVS, cardiology and nephrology following  - Cards signed off - rec continue Coreg 25mg  BID, Hydral 100mg  TID, Imdur 120QD and coumadin  - will set up follow-up in HF clinic  - Hold lasix - Daily weights/Strict I&O's - Continue fluid restriction  - continue Amlodpine, coreg, hydralazine, and Imdur  - PT/OT recommend SNF, rolling walker w/ 5" wheels and 3n1 - SN[facility] bed at [facility] approved and pending medical clearance - Follow-up PT/INR, CBC, and renal function Am  HTN: On home hydralazine, amlodipine, Coreg, doxazosin. Doxazosin discontinued. BP improved, 126/53 this morning  - continue Coreg 25mg , hydralazine, and Amlodipine 10mg  QD - Will continue to monitor   History of Mitral regurgitation s/p mitral valve replacement Chronic, stable. 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 Biprosthetic MV replacement in August 2019. Echo this admission, MV stable. Initiated Coumadin/Heparain bridge on 10/25 with goal INR 2.0-3.0, INR at goal 2.79>2.37>2.32. Heparin discontinued. No bleeding at graft or tunnel site. Healing well. Patient notes that Dr. Aundra Dubin managed her INR q2 weeks prior to admission. Patient currently on 2.5mg  warfarin QD. - INR at goal, patient medically ready for discharge - Can consider 2.5 mg everyday with one day of 5mg  if INR drops any  Atrial Fibrillation s/p MAZE: Chronic, stable.  MAZE procedure in August 2019. No recurrence since.HR: 60-70's. - warfarin per pharm - Carvedilol as above   Microcytic Anemia: chronic, stable Currently stable at 9.7. Will  continue to monitor. Will defer electrolyte management to nephro now that patient is  HD.  Hypothyroidism:  TSH on 10/26/17: 2.884. On Synthroid. - continue home dose  GERD: - protonix 40mg  BID  Anxiety/Depression: Chronic, stable.  On home Wellbutrin, Cymbalta, Xanax PRN, and Trazodone. Cymbalta decreased to 20mg  due to BEERs criteria. Patient tolerating well. Denies HI/SI. - Cymbalta 20mg , Wellbutrin and Trazodone - Xanax PRN qHS anxiety   Chest pain: resolved  FEN/GI:- renal diet, PPI Prophylaxis:Warfarin  Disposition: continued inpatient stay for anticoagulation management  Subjective:  Patient reports pain not well controlled overnight. She could not sleep all night. Admits to pain in her left arm still. Denies any chest pain currently.  Objective: Temp:  [97.5 F (36.4 C)-98.6 F (37 C)] 97.5 F (36.4 C) (10/31 1400) Pulse Rate:  [59-73] 73 (10/31 1400) Resp:  [11-25] 16 (10/31 1400) BP: (120-154)/(53-81) 120/64 (10/31 1400) SpO2:  [95 %-100 %] 100 % (10/31 1108) Weight:  [53 kg-54.5 kg] 53 kg (10/31 1108) Physical Exam:  Gen:  Calm, pleasant , lying in HD bed  Skin: Warm and dry. No obvious rashes, lesions, or trauma. Right IJV TDC without erythema, edema, or warmth. Left arm with edema localized to mid-arm with much improvement in edema in hand. Graft site appears well without erythema or warmth. Palpable thrill appreciated over graft site.  CV: RRR. <2s capillary refill bilaterally. 2+ radial pulses bilaterally. No LE edema noted. Resp: CTAB.  No wheezing, rales, abnormal lung sounds.  No increased WOB.  Extremities: Left arm diffusely painful, not as painful to palpation compared to yesterday  Laboratory: Recent Labs  Lab 04/28/18 0835 04/29/18 0507 04/29/18 1255  WBC 8.8 9.4 9.0  HGB 10.3* 9.7* 11.2*  HCT 31.5* 30.2* 34.8*  PLT 296 309 205   Recent Labs  Lab 04/28/18 0835 04/29/18 0507 04/29/18 1255  NA 132* 134* 135  K 3.9 3.5 3.5  CL 97* 95* 99  CO2 26 27 24   BUN 19 35* 6*  CREATININE 3.53* 4.65* 1.77*  CALCIUM 10.4*  10.2 9.1  GLUCOSE 118* 133* 190*    Imaging/Diagnostic Tests: No results found.  Mina Marble, DO  PGY-1, [place] Intern pager: [phone], text pages welcome

## 2018-04-29 NOTE — Progress Notes (Signed)
[place] for heparin > warfarin Indication: atrial fibrillation, prosthetic mitral valve  Allergies  Allergen Reactions  . Ace Inhibitors Anaphylaxis and Swelling  . Motrin Ib [Ibuprofen] Anaphylaxis    Patient Measurements: Height: 5\' 9"  (175.3 cm) Weight: 119 lb 4.8 oz (54.1 kg) IBW/kg (Calculated) : 66.2 Heparin Dosing Weight: 65.2 kg   Vital Signs: Temp: 97.9 F (36.6 C) (10/31 0622) Temp Source: Oral (10/31 0622) BP: 126/53 (10/31 0535) Pulse Rate: 59 (10/31 0622)  Labs: Recent Labs    04/27/18 0357 04/27/18 1515 04/28/18 0401 04/28/18 0729 04/28/18 0835 04/29/18 0507  HGB 9.5*  --  9.8*  --  10.3* 9.7*  HCT 30.3*  --  31.2*  --  31.5* 30.2*  PLT 326  --  308  --  296 309  LABPROT 29.0*  --   --  25.6*  --  25.2*  INR 2.79  --   --  2.37  --  2.32  HEPARINUNFRC 0.64  --  <0.10*  --   --   --   CREATININE 5.86*  --  3.34*  --  3.53* 4.65*  CKTOTAL  --  24*  --   --   --   --   CKMB  --  1.3  --   --   --   --   TROPONINI  --  0.05*  --   --   --   --     Estimated Creatinine Clearance: 10.2 mL/min (A) (by C-G formula based on SCr of 4.65 mg/dL (H)).   Medical History: Past Medical History:  Diagnosis Date  . Anemia    low iron  . Anxiety   . Arthritis   . Bronchitis   . CHF (congestive heart failure) ([place])   . CKD (chronic kidney disease) stage 4, GFR 15-29 ml/min (HCC) 03/16/2017  . Coronary artery disease   . Depression   . Diabetes mellitus without complication ([place])   . Diet-controlled diabetes mellitus ([place])   . Ganglion cyst    left wrist  . GERD (gastroesophageal reflux disease)   . Headache    chronic headaches  . Heart failure ([place])   . Hyperlipidemia   . Hypertension   . Hypothyroidism   . Mitral regurgitation   . Myocardial infarction ([place])    3x last one 2008  . PAF (paroxysmal atrial fibrillation) ([place])   . Pneumonia    x 2  . S/P Maze operation for atrial fibrillation 02/18/2018   Complete bilateral atrial lesion set using bipolar radiofrequency and cryothermy ablation with clipping of LA appendage  . S/P mitral valve replacement with bioprosthetic valve 02/18/2018   [facility] Mitral stented bovine pericardial tissue valve Model 7300 TFX Serial # [id number] Size 29  . Stroke [facility])    no lasting residual - ? 2014  . Umbilical hernia     Assessment: 24 yoF on warfarin PTA for Afib - was on hold planning to undergo procedures. Pt reported home regimen as 2.5 mg daily (last dose 10/17 PM).  S/P AVG and temp HD access 10/25. Heparin stopped d/t oozing. Restarted per FMTS 10/26.   INR therapeutic. Heparin d/c'd 10/29. No overt bleeding or complications documented. CBC stable.  Goal of Therapy:  INR 2.5-3- per DC TCTS in 8/19 Monitor platelets by anticoagulation protocol: Yes   Plan:  Warfarin 2.5 mg PO daily  Daily INR Monitor  for s/sx of bleeding  Elicia Lamp, PharmD, BCPS Clinical Pharmacist Clinical phone  [phone] Please check AMION for all [place] contact numbers 04/29/2018 9:48 AM

## 2018-04-29 NOTE — Progress Notes (Signed)
[facility] Progress Note    Assessment/ Plan:   67 y.o.yo femalewith advanced CKD at baseline who was admitted on 10/18/2019with what appeared to be uremic symptoms  1. Renal- felt to be uremic but BUN only 50's- s/p temp cath and HD on 10/20- was planning for another treatment 10/21 however, was making good urine and BUN only 26- I decided to observe, numbers have worsened since - removed femoral temp cath. Definitely felt she was at risk of needing dialysis again soon given trend- crt nearly 7. Now s/p AVG and TDC on 10/25 as well for [place] just go ahead and initiate HD. I did speak with daughter [name] regarding all of this- running TTS for now, being clipped to OP unit.  - Seen on  HD thru [facility] 136/60 UF2L net on 3K bath Had to give Xanax at beginning of treatment  May be close to having a spot MWF @ [facility] MWF bec of SNF transport  If she's still here over the weekend will plan on HD Sat and then convert to MWF.  -AVG with goodbruit;still hasedema as would be expected and c/o pain in the left arm -> that left arm has been increasing in size over the past few days as well. - d/w  Dr. Carlis Abbott and she will need angiogram in 4 weeks. She has a left sided pacemaker.  2. Anemia- hgb trending up without transfusion- 10.3today - on iron, have added ESA  3. Secondary hyperparathyroidism- has been on nutritional vit D only - phos OK , pth 128. Calcium now high- will stop vit D - I stopped TUMS and Ca slightly better; phos OK 4. HTN/volume- BP high- as OP on amlodipine, coreg, cardura, hydralazine and high dose diuretics - BP much better as volume status is better.  stopped norvasc so BP does not get too low- UF with HD  - UOP dropping with HD. Holding Lasix unless there are issues with IDWG. May always challenge with high dose lasix again.  5. Dispo- has access - once clipped to OP unit will be able to be discharged from my standpoint-  hopefullysometime thisweek- pt has not done well emotionally with this transitionbut understands the need for hd. Encouraged her to get out of bed to chair at minimum. -d/w primary and trying to get MWF outpt schedule bec she will need transport from SNF   Subjective:   Still c/o left arm pain but denies f/c/n/v. Had  chest pain on initiation of HD but given Xanax. No further pain. She did not sleep well last night.   Objective:   BP (!) 126/53   Pulse (!) 59   Temp 97.9 F (36.6 C) (Oral)   Resp 20   Ht 5\' 9"  (1.753 m)   Wt 54.1 kg   SpO2 100%   BMI 17.62 kg/m   Intake/Output Summary (Last 24 hours) at 04/29/2018 1013 Last data filed at 04/28/2018 2336 Gross per 24 hour  Intake 719.49 ml  Output 600 ml  Net 119.49 ml   Weight change:   Physical Exam: General: flat, - inmildpain, cooperative Heart: RRR, pacer in left side of chest Lungs: mostly clear- dec BS at extreme bases Abdomen: distended but better, +BS Extremities: Really no periph edema Dialysis Access: TDC on right and AVG on left - goodbruit- some coolness to left hand; left arm is markedly swollen  Imaging: No results found.  Labs: BMET Recent Labs  Lab 04/24/18 0500 04/25/18 0516 04/26/18 8315 04/27/18  0355 04/28/18 0401 04/28/18 0835 04/29/18 0507  NA 136 137 134* 133* 133* 132* 134*  K 3.6 3.5 3.7 3.7 3.8 3.9 3.5  CL 99 97* 98 95* 94* 97* 95*  CO2 24 26 23 25 26 26 27   GLUCOSE 101* 134* 117* 138* 109* 118* 133*  BUN 43* 24* 34* 42* 18 19 35*  CREATININE 6.34* 4.14* 5.37* 5.86* 3.34* 3.53* 4.65*  CALCIUM 10.6* 10.2 10.5* 10.3 10.3 10.4* 10.2  PHOS 5.0* 3.4 4.3 4.5 3.2 3.2 4.5   CBC Recent Labs  Lab 04/27/18 0357 04/28/18 0401 04/28/18 0835 04/29/18 0507  WBC 9.1 8.0 8.8 9.4  HGB 9.5* 9.8* 10.3* 9.7*  HCT 30.3* 31.2* 31.5* 30.2*  MCV 73.4* 73.9* 73.4* 73.8*  PLT 326 308 296 309    Medications:    . acetaminophen  650 mg Oral Q6H  . ALPRAZolam  0.5 mg Oral Once  .  amLODipine  10 mg Oral Daily  . aspirin EC  81 mg Oral Daily  . atorvastatin  80 mg Oral Daily  . B-complex with vitamin C  1 tablet Oral Daily  . buPROPion  100 mg Oral TID  . busPIRone  15 mg Oral BID  . carvedilol  25 mg Oral BID  . Chlorhexidine Gluconate Cloth  6 each Topical Q0600  . darbepoetin (ARANESP) injection - NON-DIALYSIS  60 mcg Subcutaneous Q Thu-1800  . DULoxetine  20 mg Oral Daily  . feeding supplement (NEPRO CARB STEADY)  237 mL Oral TID BM  . hydrALAZINE  100 mg Oral Q8H  . isosorbide mononitrate  120 mg Oral Daily  . levothyroxine  112 mcg Oral QAC breakfast  . montelukast  10 mg Oral Daily  . multivitamin  1 tablet Oral QHS  . pantoprazole  40 mg Oral BID AC  . polyethylene glycol  17 g Oral Daily  . senna  2 tablet Oral QHS  . traZODone  25 mg Oral QHS  . warfarin  2.5 mg Oral q1800  . Warfarin - Pharmacist Dosing Inpatient   Does not apply q1800      Otelia Santee, MD 04/29/2018, 10:13 AM

## 2018-04-29 NOTE — Progress Notes (Signed)
SATURATION QUALIFICATIONS: (This note is used to comply with regulatory documentation for home oxygen)  Patient Saturations on Room Air at Rest = 93%  Patient Saturations on Room Air while Ambulating = 89%  Patient Saturations on 0 Liters of oxygen while Ambulating = Did not walk on O2 as did not desat below 89% with activity.   Please briefly explain why patient needs home oxygen:Pt did not desat with activity below 89%.  Thanks.  [place] Pager:  [phone]  Office:  [phone]

## 2018-04-29 NOTE — Progress Notes (Signed)
Accepted at [facility] [place]  .1st treatment is Monday,November04,2019 at 12:00pm .Schedule is :Monday,Wednesday ,Friday 12:00pm

## 2018-04-29 NOTE — Progress Notes (Signed)
Physical Therapy Treatment Patient Details Name: Jocelyn Hill MRN: [id number] DOB: 05-20-1951 Today's Date: 04/29/2018    History of Present Illness Pt is a 67 y.o. female admitted 04/16/18 with chest pain, leg edema and SOB. Worked up for CHF and elevated creatinine. Temporary femoral HD cath removed 10/22. S/p AVF on 10/25. 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    PT Comments    Pt admitted with above diagnosis. Pt currently with functional limitations due to balance and endurance deficits. Pt was able to ambulate with rollator with steady gait.  Pt progressing. Still with decr endurance for activity. SNF would still be beneficial.   Pt will benefit from skilled PT to increase their independence and safety with mobility to allow discharge to the venue listed below.    SATURATION QUALIFICATIONS: (This note is used to comply with regulatory documentation for home oxygen)  Patient Saturations on Room Air at Rest = 93%  Patient Saturations on Room Air while Ambulating = 89%  Patient Saturations on 0 Liters of oxygen while Ambulating = Did not walk on O2 as did not desat below 89% with activity.   Please briefly explain why patient needs home oxygen:Pt did not desat with activity below 89%.   Follow Up Recommendations  SNF;Supervision for mobility/OOB     Equipment Recommendations  Other (comment)(rollator (4 wheeled walker))    Recommendations for Other Services       Precautions / Restrictions Precautions Precautions: Fall Restrictions Weight Bearing Restrictions: No    Mobility  Bed Mobility               General bed mobility comments: Up in chair upon PT arrival.   Transfers Overall transfer level: Needs assistance Equipment used: 4-wheeled walker Transfers: Sit to/from Stand Sit to Stand: Min guard         General transfer comment: close min guard for safety; increased time/effort, pt stood from chair x1. Reluctant  to use LUE due to pain.  Was able to lock and unlock brakes with cues.   Ambulation/Gait Ambulation/Gait assistance: Min guard Gait Distance (Feet): 150 Feet Assistive device: 4-wheeled walker Gait Pattern/deviations: Step-through pattern;Decreased stride length;Trunk flexed Gait velocity: Decreased Gait velocity interpretation: <1.31 ft/sec, indicative of household ambulator General Gait Details: Slow, steady gait with rollator; Min guard assist for safety. Sp02 >90% on RA.   Stairs             Wheelchair Mobility    Modified Rankin (Stroke Patients Only)       Balance Overall balance assessment: Needs assistance Sitting-balance support: Feet supported;No upper extremity supported Sitting balance-Leahy Scale: Good     Standing balance support: During functional activity;Bilateral upper extremity supported Standing balance-Leahy Scale: Poor Standing balance comment: reliant on UE support/external assist for balance                            Cognition Arousal/Alertness: Awake/alert Behavior During Therapy: Flat affect;WFL for tasks assessed/performed Overall Cognitive Status: Within Functional Limits for tasks assessed                                        Exercises      General Comments General comments (skin integrity, edema, etc.): RA O2 89-93% at rest and with activity.  On 2L in room.  Replaced O2 due to it  was on arrival.       Pertinent Vitals/Pain Pain Assessment: Faces Faces Pain Scale: Hurts even more Pain Location: LUE with certain movements Pain Descriptors / Indicators: Aching;Discomfort;Sore Pain Intervention(s): Limited activity within patient's tolerance;Monitored during session;Repositioned    Home Living                      Prior Function            PT Goals (current goals can now be found in the care plan section) Acute Rehab PT Goals Patient Stated Goal: to get stronger Progress towards PT  goals: Progressing toward goals    Frequency    Min 2X/week      PT Plan Current plan remains appropriate    Co-evaluation              AM-PAC PT "6 Clicks" Daily Activity  Outcome Measure  Difficulty turning over in bed (including adjusting bedclothes, sheets and blankets)?: A Little Difficulty moving from lying on back to sitting on the side of the bed? : A Little Difficulty sitting down on and standing up from a chair with arms (e.g., wheelchair, bedside commode, etc,.)?: A Little Help needed moving to and from a bed to chair (including a wheelchair)?: A Little Help needed walking in hospital room?: A Little Help needed climbing 3-5 steps with a railing? : A Lot 6 Click Score: 17    End of Session Equipment Utilized During Treatment: Gait belt Activity Tolerance: Patient limited by fatigue Patient left: in chair;with call bell/phone within reach Nurse Communication: Mobility status PT Visit Diagnosis: Other abnormalities of gait and mobility (R26.89);Muscle weakness (generalized) (M62.81)     Time: 1610-9604 PT Time Calculation (min) (ACUTE ONLY): 20 min  Charges:  $Gait Training: 8-22 mins                     [place] Pager:  [phone]  Office:  [place] 04/29/2018, 12:44 PM

## 2018-04-30 DIAGNOSIS — Z8673 Personal history of transient ischemic attack (TIA), and cerebral infarction without residual deficits: Secondary | ICD-10-CM | POA: Insufficient documentation

## 2018-04-30 DIAGNOSIS — K219 Gastro-esophageal reflux disease without esophagitis: Secondary | ICD-10-CM | POA: Insufficient documentation

## 2018-04-30 DIAGNOSIS — R519 Headache, unspecified: Secondary | ICD-10-CM | POA: Insufficient documentation

## 2018-04-30 DIAGNOSIS — N189 Chronic kidney disease, unspecified: Secondary | ICD-10-CM

## 2018-04-30 DIAGNOSIS — I349 Nonrheumatic mitral valve disorder, unspecified: Secondary | ICD-10-CM

## 2018-04-30 DIAGNOSIS — F064 Anxiety disorder due to known physiological condition: Secondary | ICD-10-CM

## 2018-04-30 DIAGNOSIS — D631 Anemia in chronic kidney disease: Secondary | ICD-10-CM

## 2018-04-30 DIAGNOSIS — Z992 Dependence on renal dialysis: Secondary | ICD-10-CM

## 2018-04-30 DIAGNOSIS — N2581 Secondary hyperparathyroidism of renal origin: Secondary | ICD-10-CM | POA: Insufficient documentation

## 2018-04-30 DIAGNOSIS — I2510 Atherosclerotic heart disease of native coronary artery without angina pectoris: Secondary | ICD-10-CM

## 2018-04-30 DIAGNOSIS — N186 End stage renal disease: Secondary | ICD-10-CM

## 2018-04-30 DIAGNOSIS — K429 Umbilical hernia without obstruction or gangrene: Secondary | ICD-10-CM | POA: Insufficient documentation

## 2018-04-30 DIAGNOSIS — M67432 Ganglion, left wrist: Secondary | ICD-10-CM | POA: Insufficient documentation

## 2018-04-30 LAB — RENAL FUNCTION PANEL
Albumin: 3.4 g/dL — ABNORMAL LOW (ref 3.5–5.0)
Anion gap: 8 (ref 5–15)
BUN: 24 mg/dL — ABNORMAL HIGH (ref 8–23)
CO2: 28 mmol/L (ref 22–32)
Calcium: 9.6 mg/dL (ref 8.9–10.3)
Chloride: 98 mmol/L (ref 98–111)
Creatinine, Ser: 3.52 mg/dL — ABNORMAL HIGH (ref 0.44–1.00)
GFR calc Af Amer: 15 mL/min — ABNORMAL LOW (ref >60)
GFR calc non Af Amer: 13 mL/min — ABNORMAL LOW (ref >60)
Glucose, Bld: 115 mg/dL — ABNORMAL HIGH (ref 70–99)
Phosphorus: 3.3 mg/dL (ref 2.5–4.6)
Potassium: 3.4 mmol/L — ABNORMAL LOW (ref 3.5–5.1)
Sodium: 134 mmol/L — ABNORMAL LOW (ref 135–145)

## 2018-04-30 LAB — CBC
HCT: 29.8 % — ABNORMAL LOW (ref 36.0–46.0)
Hemoglobin: 9.5 g/dL — ABNORMAL LOW (ref 12.0–15.0)
MCH: 23.9 pg — ABNORMAL LOW (ref 26.0–34.0)
MCHC: 31.9 g/dL (ref 30.0–36.0)
MCV: 74.9 fL — ABNORMAL LOW (ref 80.0–100.0)
Platelets: 219 10*3/uL (ref 150–400)
RBC: 3.98 MIL/uL (ref 3.87–5.11)
RDW: 15.4 % (ref 11.5–15.5)
WBC: 9.1 10*3/uL (ref 4.0–10.5)
nRBC: 0.3 % — ABNORMAL HIGH (ref 0.0–0.2)

## 2018-04-30 LAB — PROTIME-INR
INR: 2.25
Prothrombin Time: 24.5 seconds — ABNORMAL HIGH (ref 11.4–15.2)

## 2018-04-30 LAB — HEPATITIS B SURFACE ANTIGEN: Hepatitis B Surface Ag: NEGATIVE

## 2018-04-30 MED ORDER — HEPARIN SODIUM (PORCINE) 1000 UNIT/ML IJ SOLN
INTRAMUSCULAR | Status: AC
  Administered 2018-04-30: 3000 [IU]
  Filled 2018-04-30: qty 3

## 2018-04-30 MED ORDER — HYDRALAZINE HCL 50 MG PO TABS
50.0000 mg | ORAL_TABLET | Freq: Three times a day (TID) | ORAL | Status: DC
  Administered 2018-04-30 – 2018-05-01 (×4): 50 mg via ORAL
  Filled 2018-04-30 (×4): qty 1

## 2018-04-30 NOTE — Progress Notes (Signed)
[facility] Progress Note    Assessment/ Plan:   67 y.o.yo femalewith advanced CKD at baseline who was admitted on 10/18/2019with what appeared to be uremic symptoms  1. Renal- felt to be uremic but BUN only 50's- s/p temp cath and HD on 10/20- was planning for another treatment 10/21 however, was making good urine and BUN only 26- I decided to observe, numbers have worsened since - removed femoral temp cath. Definitely felt she was at risk of needing dialysis again soon given trend- crt nearly 7. Now s/p AVG and TDC on 10/25 as well for [place] just go ahead and initiate HD. I did speak with daughter [name] regarding all of this- running TTS for now, being clipped to OP unit.  - Last HD Thur thru [facility]  Has a spot MWF @ [facility] bec of SNF transport  Plan on HD 1st shift Sat and then can d/c to start at [facility] MWF (1st shift).  -AVG with goodbruit;still hasedema as would be expected and c/o pain in the left arm-> that left arm has been increasing in size over the past few days as well. Swelling actually looks a little better today but still present.   - d/w  Dr. Carlis Abbott and she will need angiogram in 4 weeks. She has a left sided pacemaker.  2. Anemia- hgb trending up without transfusion-10.3today - on iron, have added ESA  3. Secondary hyperparathyroidism- has been on nutritional vit D only - phos OK , pth 128. Calcium now high- will stop vit D - I stopped TUMS and Ca slightly better; phos OK 4. HTN/volume- BP high- as OP on amlodipine, coreg, cardura, hydralazine and high dose diuretics - BP much better as volume status is better.  stopped norvasc so BP does not get too low- UF with HD  - UOP dropping with HD. Holding Lasix unless there are issues with IDWG. May always challenge with high dose lasix again.  5. Dispo- has access - once clipped to OP unit will be able to be discharged from my standpoint- hopefullysometime thisweek- pt  has not done well emotionally with this transitionbut understands the need for hd. Encouraged her to get out of bed to chair at minimum. -d/w primary and trying to get MWF outpt schedule bec she will need transport from SNF  Subjective:    Pain in left hand after bumping left arm.  Denies f/c/n/v/dyspnea.   Objective:   BP (!) 128/55 (BP Location: Right Arm)   Pulse 67   Temp 98.4 F (36.9 C) (Oral)   Resp 18   Ht 5\' 9"  (1.753 m)   Wt 53 kg   SpO2 100%   BMI 17.25 kg/m   Intake/Output Summary (Last 24 hours) at 04/30/2018 1228 Last data filed at 04/30/2018 0100 Gross per 24 hour  Intake 600 ml  Output 250 ml  Net 350 ml   Weight change: -1.5 kg  Physical Exam: General: flat, - inmildpain, cooperative Heart: RRR, pacer in left side of chest Lungs: mostly clear- dec BS at extreme bases Abdomen: distended but better, +BS Extremities: Really no periph edema in LE Dialysis Access: TDC on right and AVG on left - goodbruit- some coolness to left hand; left arm is swollenbut less than yesterday  Imaging: No results found.  Labs: BMET Recent Labs  Lab 04/26/18 0648 04/27/18 0357 04/28/18 0401 04/28/18 0835 04/29/18 0507 04/29/18 1255 04/30/18 0415  NA 134* 133* 133* 132* 134* 135 134*  K 3.7  3.7 3.8 3.9 3.5 3.5 3.4*  CL 98 95* 94* 97* 95* 99 98  CO2 23 25 26 26 27 24 28   GLUCOSE 117* 138* 109* 118* 133* 190* 115*  BUN 34* 42* 18 19 35* 6* 24*  CREATININE 5.37* 5.86* 3.34* 3.53* 4.65* 1.77* 3.52*  CALCIUM 10.5* 10.3 10.3 10.4* 10.2 9.1 9.6  PHOS 4.3 4.5 3.2 3.2 4.5 1.6* 3.3   CBC Recent Labs  Lab 04/28/18 0401 04/28/18 0835 04/29/18 0507 04/29/18 1255  WBC 8.0 8.8 9.4 9.0  HGB 9.8* 10.3* 9.7* 11.2*  HCT 31.2* 31.5* 30.2* 34.8*  MCV 73.9* 73.4* 73.8* 74.4*  PLT 308 296 309 205    Medications:    . acetaminophen  650 mg Oral Q6H  . aspirin EC  81 mg Oral Daily  . atorvastatin  80 mg Oral Daily  . B-complex with vitamin C  1 tablet Oral Daily   . buPROPion  100 mg Oral TID  . busPIRone  15 mg Oral BID  . carvedilol  25 mg Oral BID  . Chlorhexidine Gluconate Cloth  6 each Topical Q0600  . darbepoetin (ARANESP) injection - NON-DIALYSIS  60 mcg Subcutaneous Q Thu-1800  . DULoxetine  20 mg Oral Daily  . feeding supplement (NEPRO CARB STEADY)  237 mL Oral TID BM  . hydrALAZINE  50 mg Oral Q8H  . isosorbide mononitrate  120 mg Oral Daily  . levothyroxine  112 mcg Oral QAC breakfast  . montelukast  10 mg Oral Daily  . multivitamin  1 tablet Oral QHS  . pantoprazole  40 mg Oral BID AC  . polyethylene glycol  17 g Oral Daily  . senna  2 tablet Oral QHS  . traZODone  25 mg Oral QHS  . warfarin  2.5 mg Oral q1800  . Warfarin - Pharmacist Dosing Inpatient   Does not apply q1800      Otelia Santee, MD 04/30/2018, 12:28 PM

## 2018-04-30 NOTE — Discharge Summary (Signed)
[facility] Discharge Summary  Patient name: Jocelyn Hill Medical record number: [id number] Date of birth: 1951/04/27 Age: 67 y.o. Gender: female Date of Admission: 04/16/2018  Date of Discharge: 05/01/18 Admitting Physician: Danna Hefty, DO  Primary Care Provider: [name], MD Consultants: Nephrology, Vascular Surgery, Cardiology  Indication for Hospitalization: HFrEF exacerbation, acute on chronic renal failure  Discharge Diagnoses/Problem List:  Left arm swelling due to graft ESRD on HD, MWF schedule Acute on chronic HFrEF, EF 30-35% Hypertension Mitral regurgitation s/p mitral valve replacement Atrial fibrillation s/p MAZE Chronic microcytic anemia Hypothyroidism GERD Anxiety/depression  Disposition: SNF  Discharge Condition: Stable, improved  Discharge Exam:  Gen: Elderly female sitting in chair eating breakfast in no acute distress CV: Regular rhythm, no murmur Lungs: Scattered crackles in bilateral bases, easy work of breathing Ext: No edema  Brief Hospital Course:  Jocelyn Hill is a 67 y.o. female with past medical history significant for HFrEF, CKD-IV,HTN,CAD s/p MI and CABG (01/2018),hypothyroidism, paroxysmalA-fibs/p MAZE (01/2018),mitral regurgitation s/p mitral valve replacement (01/2018) on warfarin, microcytic and anemia, anxiety/depression, who presented from her cardiology office on 10/19 with elevated creatinine and chest pain and found to have acute on chronic renal failure. Initial work up was significant for creatinine of 6.0 (elevated from 4 the day prior), BUN 60, elevated BNP (2442), and CXR with stable small bilateral pleural effusions, pulmonary vascular congestion, and cardiomegaly.  On admission patient endorsed atypical chest pain primarily located along her pacemaker site and prior CABG incision line. Due to her extensive cardiac history, cardiac work up was completed and was unremarkable. Troponins  had flat low trend of 0.07 x 3 that was felt to be most likely due to demand ischemia from volume overload. Multiple EKG's were negative for signs of STEMI. Patient was admitted for HFrEF exacerbation and acute on chronic renal failure that failed outpatient diuretics. Patient received 80mg  IV lasix in the ED and continued on her heart failure medications (Coreg, hydralazine, Atorvastatin, and Imdur). Echo remarkable for worsening heart function (EF 30-35% declined from 01/2018 with EF 45-50%). Patient's kidney function continued to worsen over subsequent days and she had a femoral temporary dialysis catheter placed on 10/20 and underwent hemodialysis without problem. Due to significant pain, however, the femoral TDC was removed on 10/22. She continued to have adequate urine output, thus replacement cath was held. Patient's chest pain improved with only intermittent occurrence of pain over her previous pacemaker site.   As patient's urine output continued to decline and creatinine steadily rose, patient was prepared for dialysis. Vein mapping was performed on 10/22. She had left upper arm AVG and right internal jugular vein TDC placed on 10/25. Re-iniatiaton of HD on 10/26. Patient continued on HD throughout the remainder of her stay and was discharged to SNF with MWF HD's arranged. Lasix was discontinued due to worsening kidney function with plan to attempt lasix challenges in the future. Patient to follow up with heart failure on 11/6.  Status-post placement of left upper arm AVG, patient's arm became diffusely swollen and tender. Patient's pain was well controlled with pain medications and left arm swelling improved over the subsequent days. Patient to follow up with vascular surgery in 4 weeks for fistulogram.   Due to the various procedures patient received during inpatient, patient's warfarin had to be discontinued and bridged with heparin. By day of discharge, she was stable back on her warfarin with her  INR at goal (between 2-3). She was instructed to follow-up  for regular INR checks.   Patient was noted on admission to have a hemoglobin of 8.7 with a MCV in the 70's. Per chart review, this appeared to be her baseline. Iron panel significant for low iron (78), low TIBC (224), This is most likely secondary to anemia of chronic disease. Once on HD, patient was started on iron supplementation and ESA. This remained stable throughout her hospital stay without requiring transfusion and she was discharged with a hemoglobin of 9.7, and normal ferritin.   During the admission, patient's fluild status continued to improve,  and Yasmyn was improved and stable by the day of discharge.  Issues for Follow Up:  1. Elevated BP during admission - recommend follow-up and titration of BP meds with input from nephrology.  2. Needs vascular surgery follow up - plan for fistulogram in 4 weeks.  3. Ensure patient followed up with Heart failure 4. Consider further workup into chronic microcytic anemia 5. Ensure patient has INR as needed to achieve goal of 2-3.   Significant Procedures:  10/20 - Echo, 10/21 Temporary femoral HD cath placed, 10/25 left arm graft and anterior chest   Significant Labs and Imaging:  Recent Labs  Lab 04/29/18 0507 04/29/18 1255 04/30/18 1447  WBC 9.4 9.0 9.1  HGB 9.7* 11.2* 9.5*  HCT 30.2* 34.8* 29.8*  PLT 309 205 219   Recent Labs  Lab 04/28/18 0401 04/28/18 0835 04/29/18 0507 04/29/18 1255 04/30/18 0415  NA 133* 132* 134* 135 134*  K 3.8 3.9 3.5 3.5 3.4*  CL 94* 97* 95* 99 98  CO2 26 26 27 24 28   GLUCOSE 109* 118* 133* 190* 115*  BUN 18 19 35* 6* 24*  CREATININE 3.34* 3.53* 4.65* 1.77* 3.52*  CALCIUM 10.3 10.4* 10.2 9.1 9.6  PHOS 3.2 3.2 4.5 1.6* 3.3  ALBUMIN 3.2* 3.3* 3.4* 3.8 3.4*   Dg Chest 2 View  Result Date: 04/16/2018 CLINICAL DATA:  67 y/o  F; chest pain. EXAM: CHEST - 2 VIEW COMPARISON:  03/16/2018 chest radiograph. FINDINGS: Stable cardiomegaly given  projection and technique. Mitral valve replacement and PFO closure. Post CABG with sternotomy wires in alignment. Stable 3 lead pacemaker. Small bilateral pleural effusions and chronic pulmonary vascular congestion. No consolidation or pneumothorax. Calcific aortic atherosclerosis. Bones are unremarkable. IMPRESSION: Stable small bilateral pleural effusions, pulmonary vascular congestion, and cardiomegaly. Electronically Signed   By: Kristine Garbe M.D.   On: 04/16/2018 23:36   Dg Chest Port 1 View  Result Date: 04/23/2018 CLINICAL DATA:  Dialysis catheter insertion. EXAM: PORTABLE CHEST 1 VIEW COMPARISON:  04/16/2018. FINDINGS: Dual-lumen right IJ catheter noted with tip over cavoatrial junction. AICD in stable position. Prior cardiac valve replacement. Left atrial appendage clip. Prior CABG. Cardiomegaly. Pulmonary vascular prominence and bilateral interstitial prominence. Left-sided pleural effusion. Findings suggest CHF. IMPRESSION: 1.  Dual-lumen right IJ catheter with tip at cavoatrial junction. 2. AICD in stable position. Prior CABG and cardiac valve replacement. Cardiomegaly with bilateral pulmonary interstitial prominence and left-sided pleural effusion consistent with CHF noted on today's exam. Electronically Signed   By: [place]   On: 04/23/2018 14:29   ECHO: Study Conclusions - Left ventricle: The cavity size was normal. There was severe   concentric hypertrophy. Systolic function was moderately to   severely reduced. The estimated ejection fraction was in the   range of 30% to 35%. Features are consistent with a pseudonormal   left ventricular filling pattern, with concomitant abnormal   relaxation and increased filling pressure (grade 2  diastolic   dysfunction). Doppler parameters are consistent with elevated   ventricular end-diastolic filling pressure. - Mitral valve: S/P MVR with a bioprosthetic valve. Mean gradient   (D): 5 mm Hg. Valve area by continuity  equation (using LVOT   flow): 1.06 cm^2. - Right ventricle: Systolic function was normal. - Right atrium: The atrium was normal in size. - Pulmonic valve: There was mild regurgitation. - Pulmonary arteries: Systolic pressure was within the normal   range. - Pericardium, extracardiac: A trivial pericardial effusion was   identified posterior to the heart. Features were not consistent   with tamponade physiology. There was a large left pleural   effusion.  Impressions: - LVEF is 30-35% with diffuse hypokinesis and significant   paradoxical septal motion and interventricular dyssynchrony.   LVEDP is severely elevated with E/E&' =32. LVEF is unchanged from   the prior echocardiogram from 02/22/2018.   Results/Tests Pending at Time of Discharge: None  Discharge Medications:  Allergies as of 05/01/2018      Reactions   Ace Inhibitors Anaphylaxis, Swelling   Motrin Ib [ibuprofen] Anaphylaxis      Medication List    STOP taking these medications   amLODipine 10 MG tablet Commonly known as:  NORVASC   doxazosin 8 MG tablet Commonly known as:  CARDURA   gabapentin 300 MG capsule Commonly known as:  NEURONTIN   metolazone 2.5 MG tablet Commonly known as:  ZAROXOLYN   ondansetron 4 MG tablet Commonly known as:  ZOFRAN   oxyCODONE 5 MG immediate release tablet Commonly known as:  Oxy IR/ROXICODONE   potassium chloride SA 20 MEQ tablet Commonly known as:  K-DUR,KLOR-CON   torsemide 20 MG tablet Commonly known as:  DEMADEX     TAKE these medications   acetaminophen 325 MG tablet Commonly known as:  TYLENOL Take 2 tablets (650 mg total) by mouth every 6 (six) hours as needed for mild pain, fever or headache.   albuterol 108 (90 Base) MCG/ACT inhaler Commonly known as:  PROVENTIL HFA;VENTOLIN HFA Inhale 2 puffs into the lungs every 6 (six) hours as needed for wheezing or shortness of breath.   ALPRAZolam 0.25 MG tablet Commonly known as:  XANAX Take 1 tablet (0.25 mg  total) by mouth at bedtime as needed for anxiety.   aspirin 81 MG EC tablet Take 1 tablet (81 mg total) by mouth daily.   atorvastatin 80 MG tablet Commonly known as:  LIPITOR Take 1 tablet (80 mg total) by mouth daily.   b complex vitamins tablet Take 1 tablet by mouth daily.   buPROPion 100 MG tablet Commonly known as:  WELLBUTRIN Take 100 mg by mouth 3 (three) times daily.   busPIRone 15 MG tablet Commonly known as:  BUSPAR Take 1 tablet (15 mg total) by mouth 2 (two) times daily.   carvedilol 25 MG tablet Commonly known as:  COREG Take 1 tablet (25 mg total) by mouth 2 (two) times daily. What changed:    medication strength  how much to take   cholecalciferol 1000 units tablet Commonly known as:  VITAMIN D Take 1,000 Units by mouth daily.   DULoxetine 20 MG capsule Commonly known as:  CYMBALTA Take 1 capsule (20 mg total) by mouth daily. Start taking on:  05/02/2018 What changed:    medication strength  how much to take   feeding supplement (NEPRO CARB STEADY) Liqd Take 237 mLs by mouth 3 (three) times daily between meals. What changed:  when to take this  hydrALAZINE 50 MG tablet Commonly known as:  APRESOLINE Take 1 tablet (50 mg total) by mouth every 8 (eight) hours. What changed:    medication strength  how much to take   isosorbide mononitrate 120 MG 24 hr tablet Commonly known as:  IMDUR Take 1 tablet (120 mg total) by mouth daily.   levothyroxine 112 MCG tablet Commonly known as:  SYNTHROID, LEVOTHROID Take 112 mcg by mouth daily before breakfast.   montelukast 10 MG tablet Commonly known as:  SINGULAIR Take 1 tablet (10 mg total) by mouth daily.   multivitamin Tabs tablet Take 1 tablet by mouth at bedtime.   pantoprazole 40 MG tablet Commonly known as:  PROTONIX Take 1 tablet (40 mg total) by mouth 2 (two) times daily before a meal.   polyethylene glycol packet Commonly known as:  MIRALAX / GLYCOLAX Take 17 g by mouth  daily. Start taking on:  05/02/2018   traZODone 25 mg Tabs tablet Commonly known as:  DESYREL Take 25 mg by mouth at bedtime.   warfarin 2.5 MG tablet Commonly known as:  COUMADIN Take 1 tablet (2.5 mg total) by mouth daily at 6 PM.       Discharge Instructions: Please refer to Patient Instructions section of EMR for full details.  Patient was counseled important signs and symptoms that should prompt return to medical care, changes in medications, dietary instructions, activity restrictions, and follow up appointments.   Follow-Up Appointments:  Contact information for follow-up providers    Marty Heck, MD Follow up in 4 week(s).   Specialty:  Vascular Surgery Why:  office will call Contact information: [place] [address] [phone]        [facility]. Go on 05/05/2018.   Specialty:  Cardiology Why:  at 3 pm in the [facility].  Please bring all medications to appt.  [place] code 1800 for November. Contact information: [address] [phone] [place] [phone]           Contact information for after-discharge care    Destination    [facility] Preferred SNF .   Service:  Skilled Nursing Contact information: [address] [place]                  Sela Hilding, MD 05/01/2018, 10:16 AM PGY-3, [place]

## 2018-04-30 NOTE — Progress Notes (Signed)
Occupational Therapy Treatment Patient Details Name: Jocelyn Hill MRN: [id number] DOB: [birth year] Today's Date: 04/30/2018    History of present illness Pt is a 67 y.o. female admitted 04/16/18 with chest pain, leg edema and SOB. Worked up for CHF and elevated creatinine. Temporary femoral HD cath removed 10/22. S/p AVF on 10/25. 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   OT comments  Pt able to don slippers and complete basic transfer. Pt fatigues quickly and needs rest breaks. Pt able to verbalize 1 energy conservation strategy.    Follow Up Recommendations  SNF;Supervision/Assistance - 24 hour    Equipment Recommendations  3 in 1 bedside commode    Recommendations for Other Services      Precautions / Restrictions Precautions Precautions: Fall Precaution Comments: HD MWF - new L UE port Restrictions Weight Bearing Restrictions: No       Mobility Bed Mobility   Bed Mobility: Sit to Supine     Supine to sit: Supervision     General bed mobility comments: up in chair on arrival and ending session in bed  Transfers Overall transfer level: Needs assistance Equipment used: Rolling walker (2 wheeled) Transfers: Sit to/from Stand Sit to Stand: Min guard              Balance Overall balance assessment: Needs assistance Sitting-balance support: Feet supported;No upper extremity supported Sitting balance-Leahy Scale: Good     Standing balance support: During functional activity;Bilateral upper extremity supported Standing balance-Leahy Scale: Poor Standing balance comment: reliant on UE support/external assist for balance                           ADL either performed or assessed with clinical judgement   ADL Overall ADL's : Needs assistance/impaired   Eating/Feeding Details (indicate cue type and reason): poor po intake- currently no intake of lunch present                     Toilet Transfer:  Supervision/safety;RW           Functional mobility during ADLs: Supervision/safety;Rolling walker General ADL Comments: pt with HR 70-72 with dizziness. BP 137/72  . pt requires rest break     Vision       Perception     Praxis      Cognition Arousal/Alertness: Awake/alert Behavior During Therapy: Flat affect;WFL for tasks assessed/performed Overall Cognitive Status: Within Functional Limits for tasks assessed                                          Exercises     Shoulder Instructions       General Comments      Pertinent Vitals/ Pain       Pain Assessment: Faces Faces Pain Scale: Hurts even more Pain Location: LUE with certain movements Pain Descriptors / Indicators: Aching;Discomfort;Sore Pain Intervention(s): Monitored during session;Premedicated before session;Repositioned  Home Living Family/patient expects to be discharged to:: Skilled nursing facility                                        Prior Functioning/Environment              Frequency  Min 2X/week  Progress Toward Goals  OT Goals(current goals can now be found in the care plan section)  Progress towards OT goals: Progressing toward goals  Acute Rehab OT Goals Patient Stated Goal: to leave tomorrow OT Goal Formulation: With patient Time For Goal Achievement: 05/02/18 Potential to Achieve Goals: Good ADL Goals Additional ADL Goal #1: pt will demonstrate 2 EC techniques during adls Additional ADL Goal #2: pt will complete adl mod I at sink level with seat position and 2 rest breaks  Plan Discharge plan remains appropriate    Co-evaluation                 AM-PAC PT "6 Clicks" Daily Activity     Outcome Measure   Help from another person eating meals?: None Help from another person taking care of personal grooming?: A Little Help from another person toileting, which includes using toliet, bedpan, or urinal?: A Little Help from  another person bathing (including washing, rinsing, drying)?: A Little Help from another person to put on and taking off regular upper body clothing?: None Help from another person to put on and taking off regular lower body clothing?: A Lot 6 Click Score: 19    End of Session Equipment Utilized During Treatment: Gait belt;Rolling walker  OT Visit Diagnosis: Unsteadiness on feet (R26.81);Muscle weakness (generalized) (M62.81)   Activity Tolerance Patient tolerated treatment well   Patient Left in bed;with call bell/phone within reach   Nurse Communication Mobility status;Precautions        Time: 4076-8088 OT Time Calculation (min): 15 min  Charges: OT General Charges $OT Visit: 1 Visit OT Treatments $Therapeutic Activity: 8-22 mins   Jeri Modena, OTR/L  Acute Rehabilitation Services Pager: [phone] Office: [phone] .    Parke Poisson B 04/30/2018, 1:21 PM

## 2018-04-30 NOTE — Progress Notes (Signed)
Pt's BP has remained WDL in 062I systolic throughout the night. RN held the hydralazine at 10pm per on call resident's orders. Pt's morning BP remains in the 948'N systolic. Will hold morning dose of hydralazine and continue to monitor.
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
## 2018-04-30 NOTE — Progress Notes (Signed)
Jocelyn Hill for heparin > warfarin Indication: atrial fibrillation, prosthetic mitral valve  Allergies  Allergen Reactions  . Ace Inhibitors Anaphylaxis and Swelling  . Motrin Ib [Ibuprofen] Anaphylaxis    Patient Measurements: Height: 5\' 9"  (175.3 cm) Weight: 116 lb 13.5 oz (53 kg) IBW/kg (Calculated) : 66.2 Heparin Dosing Weight: 65.2 kg   Vital Signs: Temp: 98.4 F (36.9 C) (11/01 0500) Temp Source: Oral (11/01 0500) BP: 128/55 (11/01 0500) Pulse Rate: 67 (11/01 0500)  Labs: Recent Labs    04/27/18 1515  04/28/18 0401 04/28/18 0729 04/28/18 0835 04/29/18 0507 04/29/18 1255 04/30/18 0415 04/30/18 0756  HGB  --    < > 9.8*  --  10.3* 9.7* 11.2*  --   --   HCT  --    < > 31.2*  --  31.5* 30.2* 34.8*  --   --   PLT  --    < > 308  --  296 309 205  --   --   LABPROT  --   --   --  25.6*  --  25.2*  --   --  24.5*  INR  --   --   --  2.37  --  2.32  --   --  2.25  HEPARINUNFRC  --   --  <0.10*  --   --   --   --   --   --   CREATININE  --    < > 3.34*  --  3.53* 4.65* 1.77* 3.52*  --   CKTOTAL 24*  --   --   --   --   --   --   --   --   CKMB 1.3  --   --   --   --   --   --   --   --   TROPONINI 0.05*  --   --   --   --   --   --   --   --    < > = values in this interval not displayed.    Estimated Creatinine Clearance: 13.2 mL/min (A) (by C-G formula based on SCr of 3.52 mg/dL (H)).   Medical History: Past Medical History:  Diagnosis Date  . Anemia    low iron  . Anxiety   . Arthritis   . Bronchitis   . CHF (congestive heart failure) ([place])   . CKD (chronic kidney disease) stage 4, GFR 15-29 ml/min (HCC) 03/16/2017  . Coronary artery disease   . Depression   . Diabetes mellitus without complication ([place])   . Diet-controlled diabetes mellitus ([place])   . Ganglion cyst    left wrist  . GERD (gastroesophageal reflux disease)   . Headache    chronic headaches  . Heart failure ([place])   . Hyperlipidemia   . Hypertension    . Hypothyroidism   . Mitral regurgitation   . Myocardial infarction ([place])    3x last one 2008  . PAF (paroxysmal atrial fibrillation) ([place])   . Pneumonia    x 2  . S/P Maze operation for atrial fibrillation 02/18/2018   Complete bilateral atrial lesion set using bipolar radiofrequency and cryothermy ablation with clipping of LA appendage  . S/P mitral valve replacement with bioprosthetic valve 02/18/2018   [facility] Mitral stented bovine pericardial tissue valve Model 7300 TFX Serial # [id number] Size 29  . Stroke [facility])    no lasting residual - ? 2014  .  Umbilical hernia     Assessment: 37 yoF on warfarin PTA for Afib - was on hold planning to undergo procedures. Pt reported home regimen as 2.5 mg daily (last dose 10/17 PM).  S/P AVG and temp HD access 10/25. Heparin stopped d/t oozing. Restarted per FMTS 10/26.   INR therapeutic, stable. Heparin d/c'd 10/29. No overt bleeding or complications documented. CBC stable.  Goal of Therapy:  INR 2.5-3- per DC TCTS in 8/19 Monitor platelets by anticoagulation protocol: Yes   Plan:  Warfarin 2.5 mg PO daily  Daily INR Monitor  for s/sx of bleeding  Elicia Lamp, PharmD, BCPS Clinical Pharmacist Clinical phone [phone] Please check AMION for all [place] contact numbers 04/30/2018 9:25 AM

## 2018-04-30 NOTE — Progress Notes (Addendum)
4:43 pm [facility] does have insurance authorization for patient and will be ready to admit patient tomorrow, 05/01/18. Did consult with MD and plan for discharge tomorrow. CSW to follow and support.  1:46 pm [facility] is awaiting new [facility] authorization for patient. Auth is required before patient can admit to the facility. CSW to follow.  Estanislado Emms, [place]

## 2018-05-01 MED ORDER — NEPRO/CARBSTEADY PO LIQD: 237.0000 mL | Freq: Three times a day (TID) | ORAL | 0 refills | Status: AC

## 2018-05-01 MED ORDER — HYDRALAZINE HCL 50 MG PO TABS: 50.0000 mg | ORAL_TABLET | Freq: Three times a day (TID) | ORAL | Status: DC

## 2018-05-01 MED ORDER — RENA-VITE PO TABS: 1.0000 | ORAL_TABLET | Freq: Every day | ORAL | 0 refills | Status: AC

## 2018-05-01 MED ORDER — POLYETHYLENE GLYCOL 3350 17 G PO PACK: 17.0000 g | PACK | Freq: Every day | ORAL | 0 refills | Status: AC

## 2018-05-01 MED ORDER — CARVEDILOL 25 MG PO TABS: 25.0000 mg | ORAL_TABLET | Freq: Two times a day (BID) | ORAL | Status: DC

## 2018-05-01 MED ORDER — DULOXETINE HCL 20 MG PO CPEP: 20.0000 mg | ORAL_CAPSULE | Freq: Every day | ORAL | 3 refills | Status: DC

## 2018-05-01 NOTE — Plan of Care (Signed)

## 2018-05-01 NOTE — Progress Notes (Signed)
CSW attempted to make contact with admission director at facility.  CSW left several voice messages to return phone call. CSW will continue to follow up with facility for disposition.  Reed Breech LCSWA [phone]

## 2018-05-01 NOTE — Progress Notes (Signed)
Patient will Discharge To: [facility] Anticipated DC Date:05-01-18 Family Notified:yes, daughter Ravenne Wayment [phone] Transport EE:ATVV   Per MD patient ready for DC to [facility] . RN, patient, patient's family, and facility notified of DC. Assessment, Fl2/Pasrr, and Discharge Summary sent to facility. RN given number for report [phone], Room # 105). DC packet on chart. Ambulance transport requested for patient.   CSW signing off.  Reed Breech LCSWA [phone]

## 2018-05-01 NOTE — Progress Notes (Signed)
[facility] Progress Note    Assessment/ Plan:   67 y.o.yo femalewith advanced CKD at baseline who was admitted on 10/18/2019with what appeared to be uremic symptoms  1. Renal- felt to be uremic but BUN only 50's- s/p temp cath and HD on 10/20- was planning for another treatment 10/21 however, was making good urine and BUN only 26- I decided to observe, numbers have worsened since - removed femoral temp cath. Definitely felt she was at risk of needing dialysis again soon given trend- crt nearly 7. Now s/p AVG and TDC on 10/25 as well for [place] just go ahead and initiate HD. I did speak with daughter Jocelyn Hill regarding all of this- running TTS for now, being clipped to OP unit.  -Last HD Fri  thru [facility] -> due to logistical issues we ended up dialyzing her last night for 3 hrs w/ limited UF. - She has a spot MWF @ [facility]  - Stable for d/c to [facility] (1st shift).  -AVG with goodbruit;still hasedema as would be expected and c/o pain in the left arm-> that left arm has been increasing in size over the past few days as well.   - Swelling not any worse.  -d/wDr. Carlis Abbott andshewillneed angiogram in 4 weeks. She has a left sided pacemaker.  2. Anemia- hgb trending up without transfusion-10.3today - on iron, have added ESA  3. Secondary hyperparathyroidism- has been on nutritional vit D only - phos OK , pth 128. Calcium now high- will stop vit D -IstoppedTUMS and Ca slightly better;phosOK 4. HTN/volume- BP high- as OP on amlodipine, coreg, cardura, hydralazine and high dose diuretics - BP much better as volume status is better.  stopped norvasc so BP does not get too low- UF with HD  - UOP dropping with HD.HoldingLasix unless there are issues with IDWG. May always challenge with high dose lasix again.  5. Dispo- has access - once clipped to OP unit will be able to be discharged from my standpoint- hopefullysometime thisweek- pt has  not done well emotionally with this transitionbut understands the need for hd. Encouraged her to get out of bed to chair at minimum. -d/w primary and trying to get MWF outpt schedule bec she will need transport from SNF  Subjective:   Mild discomfort in left hand but she is excited about leaving the hospital.  Denies f/c/n/v/dyspnea.   Objective:   BP (!) 153/88 (BP Location: Right Arm)   Pulse 77   Temp 98.7 F (37.1 C) (Oral)   Resp 18   Ht 5\' 9"  (1.753 m)   Wt 54.8 kg   SpO2 97%   BMI 17.85 kg/m   Intake/Output Summary (Last 24 hours) at 05/01/2018 1004 Last data filed at 05/01/2018 7026 Gross per 24 hour  Intake 500 ml  Output 553 ml  Net -53 ml   Weight change: 1.7 kg  Physical Exam: General: flat, - inmildpain, cooperative Heart: RRR, pacer in left side of chest Lungs: mostly clear- dec BS at extreme bases Abdomen: distended but better, +BS Extremities: Really no periph edema in LE Dialysis Access: TDC on right and AVG on left - goodbruit- some coolness to left hand; left arm is swollenbut no worse than yesterday  Imaging: No results found.  Labs: BMET Recent Labs  Lab 04/26/18 0648 04/27/18 0357 04/28/18 0401 04/28/18 0835 04/29/18 0507 04/29/18 1255 04/30/18 0415  NA 134* 133* 133* 132* 134* 135 134*  K 3.7 3.7 3.8 3.9 3.5 3.5 3.4*  CL 98 95* 94* 97* 95* 99 98  CO2 23 25 26 26 27 24 28   GLUCOSE 117* 138* 109* 118* 133* 190* 115*  BUN 34* 42* 18 19 35* 6* 24*  CREATININE 5.37* 5.86* 3.34* 3.53* 4.65* 1.77* 3.52*  CALCIUM 10.5* 10.3 10.3 10.4* 10.2 9.1 9.6  PHOS 4.3 4.5 3.2 3.2 4.5 1.6* 3.3   CBC Recent Labs  Lab 04/28/18 0835 04/29/18 0507 04/29/18 1255 04/30/18 1447  WBC 8.8 9.4 9.0 9.1  HGB 10.3* 9.7* 11.2* 9.5*  HCT 31.5* 30.2* 34.8* 29.8*  MCV 73.4* 73.8* 74.4* 74.9*  PLT 296 309 205 219    Medications:    . acetaminophen  650 mg Oral Q6H  . aspirin EC  81 mg Oral Daily  . atorvastatin  80 mg Oral Daily  . B-complex with  vitamin C  1 tablet Oral Daily  . buPROPion  100 mg Oral TID  . busPIRone  15 mg Oral BID  . carvedilol  25 mg Oral BID  . Chlorhexidine Gluconate Cloth  6 each Topical Q0600  . darbepoetin (ARANESP) injection - NON-DIALYSIS  60 mcg Subcutaneous Q Thu-1800  . DULoxetine  20 mg Oral Daily  . feeding supplement (NEPRO CARB STEADY)  237 mL Oral TID BM  . hydrALAZINE  50 mg Oral Q8H  . isosorbide mononitrate  120 mg Oral Daily  . levothyroxine  112 mcg Oral QAC breakfast  . montelukast  10 mg Oral Daily  . multivitamin  1 tablet Oral QHS  . pantoprazole  40 mg Oral BID AC  . polyethylene glycol  17 g Oral Daily  . senna  2 tablet Oral QHS  . traZODone  25 mg Oral QHS  . warfarin  2.5 mg Oral q1800  . Warfarin - Pharmacist Dosing Inpatient   Does not apply q1800      Jocelyn Santee, MD 05/01/2018, 10:04 AM

## 2018-05-03 DIAGNOSIS — D509 Iron deficiency anemia, unspecified: Secondary | ICD-10-CM | POA: Insufficient documentation

## 2018-05-03 DIAGNOSIS — E876 Hypokalemia: Secondary | ICD-10-CM | POA: Insufficient documentation

## 2018-05-03 DIAGNOSIS — D689 Coagulation defect, unspecified: Secondary | ICD-10-CM | POA: Insufficient documentation

## 2018-05-03 DIAGNOSIS — L299 Pruritus, unspecified: Secondary | ICD-10-CM | POA: Insufficient documentation

## 2018-05-03 DIAGNOSIS — R52 Pain, unspecified: Secondary | ICD-10-CM | POA: Insufficient documentation

## 2018-05-03 DIAGNOSIS — R0600 Dyspnea, unspecified: Secondary | ICD-10-CM | POA: Insufficient documentation

## 2018-05-04 NOTE — Progress Notes (Signed)
Advanced Heart Failure Clinic Note   PCP: Dr. Dorita Fray ([place]) HF Cardiology: Dr. Aundra Dubin  HPI: Jocelyn Hill is a 67 y.o. female with h/o mitral regurgitation, chronic diastolic CHF, CAD s/p MI, long standing HTN, DM2, CKD IV-V, h/o Stroke. Anxiety, depression and GERD. Patient reported that she has had 2 MIs treated at [facility] in [place] several years ago.   Presented to [facility] on 03/12/2017 with acute hypoxic respiratory failure. ECHO showed normal LV function with severe MR. Diuresed with IV lasix over 20 pounds. She was being considered for MVR by Dr Roxy Manns but due to multiple complications she was not a candidate. Hospital course was complicated by acute respiratory failure, acute renal failure, atrial fibrillation, retroperitoneal bleed, r hydronephrosis (perc tube) and E coli bacteremia. She was started on anticoagulation for atrial fibrillation but this was stopped with RP hematoma.  She converted to NSR on amiodarone but developed junctional rhythm so amiodarone was stopped. Creatinine peaked at 5.  She did not require dialysis and gradually renal function improved. Discharged to SNF without diuretics. Discharge weight was 124 pounds.   Admitted again 04/12/17-04/23/17 with respiratory failure from [facility]. She had gained 16 pounds at SNF as she was not on diuretic.  CXR with bilateral infiltrates. Placed on vanc and zosyn. Pertinent admission labs included BNP > 3000, creatinine 3.75, K 4.1, WBC 16.1, procalcitonin 2.06, and troponin 0.04. Requiring 100% high flow oxygen. Blood CX - NGTD. Diuresing with high dose IV lasix.  Nephrology consulted, renal US with no evidence of hydronephrosis (pt had percutaneous nephrostomy drain the month prior due to this). Also placed on NTG drip for HTN and CP. She was seen by Dr. Roxy Manns again and felt still to not be a MVR candidate. Discharged on Lasix 160 mg BID. Discharge weight was 114 pounds.   TTE 03/12/17 LVEF  60-65%, Grade 2 DD, Severe MR, Severe LAE, Mild RAE, Mild/Mod TR, PA peak pressure 60 mm hg, small/mod pericardial effusion.  TEE 03/17/17 LVEF 55-60%, Trivial TR, Severe MR, mild LAE, mild mod TR. No evidence of vegetation.  LHC/RHC in 1/19 showed 90% ostial LCx, 60% mid LAD.   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   She was admitted in 8/19 for Bioprosthetic MVR, Maze, LA appendage clip, PFO closure, and CABG with LIMA-LAD and SVG-LCx.  This was complicated by AKI and complete heart block.  St Jude CRT-P device placed.  Echo in 8/19 post-op showed EF 45-50%, stable bioprosthetic mitral valve.    Admitted 10/18-11/2/19 with volume overload and AKI with creatinine up to 5.9. Attempted diuresis with high dose lasix, but ultimately required HD for volume removal. VVS consulted and placed AVF on 10/25. She had significant swelling and pain to RUE. Improved over time. VVS planned for 4 week follow up with fistulogram. DC weight: 120 lbs. Discharged to SNF and arranged for HD MWF.   She returns today for post hospital follow up. Overall doing okay. She is now at [facility] in [place]. She is getting HD MWF. She is sometimes SOB with ADLs and with walking, but most of the time can walk with no SOB. Working with PT and OT and is getting stronger. She continues to have LUE pain and swelling. Sees VVS tomorrow. No bleeding on coumadin. No edema. She chronically sleeps sitting up. Tolerating HD okay. She is still making some urine. BP elevated at SNF 180/100  per patient. Unsure of her weights. Meds and meals through SNF.  ECG (personally reviewed): NSR, BiV paced  Labs (10/18): K 3.8, creatinine 2.88 Labs (12/18): LDL 64, HDL 66 Labs (3/19): hgb 11.4 Labs (4/19): K 3.9, creatinine 3.97 Labs (7/19): K 4.2, creatinine 3.6 Labs (9/19): K 4.8, creatinine 4.29  Review of systems complete and found to be  negative unless listed in HPI.   PMH: 1. Chronic diastolic CHF:  - TTE 8/50/27 LVEF 60-65%, Grade 2 DD, Severe MR, Severe LAE, Mild RAE, Mild/Mod TR, PA peak pressure 60 mm hg, small/mod pericardial effusion. - RHC (1/19): mean RA 2, PA 37/12 mean 26, mean PCWP 13, CI 2.98 - Echo (8/19) with EF 45-50%, bioprosthetic MV stable.  - St Jude CRT-P for complete heart block.   2. CKD: stage IV.  3. Anxiety/panic attacks 4. DM 5. HTN 6. CAD: Patient reports prior MI treated at [facility].  No records available - LHC in 1/19 showed 90% ostial LCx, 60% mid LAD.  - CABG 8/19 with LIMA-LAD and SVG-LCx.  7. Retroperitoneal hematoma: With anticoagulation in 9/18.  8. Hydronephrosis due to RP hematoma: Resolved.  9. GERD 10. Hyperlipidemia 11. H/o CVA 12. 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  - TEE 03/17/17 LVEF 55-60%, Trivial TR, Severe MR, mild LAE, mild mod TR. No evidence of vegetation. 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  - Bioprosthetic MV replacement in 8/19.  13. Atrial fibrillation: Paroxysmal.  Noted in 9/18, not anticoagulated currently due to RP hematoma.  - Junctional rhythm on amiodarone, amiodarone stopped. - Maze 8/19.  14. PFO: s/p closure 8/19.  15. Complete heart block post-op 8/19: Now has St Jude CRT-P device.    Current Outpatient Medications  Medication Sig Dispense Refill  . acetaminophen (TYLENOL) 325 MG tablet Take 2 tablets (650 mg total) by mouth every 6 (six) hours as needed for mild pain, fever or headache. 30 tablet 0  . albuterol (PROVENTIL HFA;VENTOLIN HFA) 108 (90 Base) MCG/ACT inhaler Inhale 2 puffs into the lungs every 6 (six) hours as needed for wheezing or shortness of breath.    . ALPRAZolam (XANAX) 0.25 MG tablet Take 1 tablet (0.25 mg total) by mouth at bedtime as needed for anxiety. 30 tablet  0  . aspirin EC 81 MG EC tablet Take 1 tablet (81 mg total) by mouth daily. 30 tablet 0  . atorvastatin (LIPITOR) 80 MG tablet Take 1 tablet (80 mg total) by mouth daily. 30 tablet 3  . b complex vitamins tablet Take 1 tablet by mouth daily.    Marland Kitchen buPROPion (WELLBUTRIN) 100 MG tablet Take 100 mg by mouth 3 (three) times daily.    . busPIRone (BUSPAR) 15 MG tablet Take 1 tablet (15 mg total) by mouth 2 (two) times daily. 60 tablet 0  . carvedilol (COREG) 25 MG tablet Take 1 tablet (25 mg total) by mouth 2 (two) times daily.    . cholecalciferol (VITAMIN D) 1000 units tablet Take 1,000 Units by mouth daily.    . DULoxetine (CYMBALTA) 20 MG capsule Take 1 capsule (20 mg total) by mouth daily.  3  . hydrALAZINE (APRESOLINE) 50 MG tablet Take 1 tablet (50 mg total) by mouth every 8 (eight) hours.    Marland Kitchen ipratropium-albuterol (DUONEB) 0.5-2.5 (3) MG/3ML SOLN Take 3 mLs by nebulization every 6 (six) hours as needed.    Marland Kitchen  isosorbide mononitrate (IMDUR) 120 MG 24 hr tablet Take 1 tablet (120 mg total) by mouth daily. 30 tablet 0  . levothyroxine (SYNTHROID, LEVOTHROID) 112 MCG tablet Take 112 mcg by mouth daily before breakfast.    . montelukast (SINGULAIR) 10 MG tablet Take 1 tablet (10 mg total) by mouth daily. 30 tablet 0  . multivitamin (RENA-VIT) TABS tablet Take 1 tablet by mouth at bedtime.  0  . Nutritional Supplements (FEEDING SUPPLEMENT, NEPRO CARB STEADY,) LIQD Take 237 mLs by mouth 3 (three) times daily between meals.  0  . ondansetron (ZOFRAN) 4 MG tablet Take 4 mg by mouth every 6 (six) hours as needed for nausea or vomiting.    Marland Kitchen oxyCODONE (OXY IR/ROXICODONE) 5 MG immediate release tablet Take 5 mg by mouth every 8 (eight) hours as needed for severe pain.    . pantoprazole (PROTONIX) 40 MG tablet Take 1 tablet (40 mg total) by mouth 2 (two) times daily before a meal. 60 tablet 0  . polyethylene glycol (MIRALAX / GLYCOLAX) packet Take 17 g by mouth daily. 14 each 0  . traZODone (DESYREL) 25 mg  TABS tablet Take 25 mg by mouth at bedtime.    Marland Kitchen warfarin (COUMADIN) 2.5 MG tablet Take 1 tablet (2.5 mg total) by mouth daily at 6 PM. 30 tablet 3   No current facility-administered medications for this encounter.     Allergies  Allergen Reactions  . Ace Inhibitors Anaphylaxis and Swelling  . Motrin Ib [Ibuprofen] Anaphylaxis      Social History   Socioeconomic History  . Marital status: Divorced    Spouse name: Not on file  . Number of children: Not on file  . Years of education: Not on file  . Highest education level: Not on file  Occupational History  . Not on file  Social Needs  . Financial resource strain: Not on file  . Food insecurity:    Worry: Not on file    Inability: Not on file  . Transportation needs:    Medical: Not on file    Non-medical: Not on file  Tobacco Use  . Smoking status: Former Smoker    Types: Cigarettes  . Smokeless tobacco: Never Used  Substance and Sexual Activity  . Alcohol use: No  . Drug use: No    Comment: marijuana in the past  . Sexual activity: Not Currently  Lifestyle  . Physical activity:    Days per week: Not on file    Minutes per session: Not on file  . Stress: Not on file  Relationships  . Social connections:    Talks on phone: Not on file    Gets together: Not on file    Attends religious service: Not on file    Active member of club or organization: Not on file    Attends meetings of clubs or organizations: Not on file    Relationship status: Not on file  . Intimate partner violence:    Fear of current or ex partner: Not on file    Emotionally abused: Not on file    Physically abused: Not on file    Forced sexual activity: Not on file  Other Topics Concern  . Not on file  Social History Narrative  . Not on file      Family History  Problem Relation Age of Onset  . Hypertension Mother     Vitals:   05/05/18 1517  BP: (!) 172/98  Pulse: 86  SpO2: 100%  Weight: 57.3 kg (126 lb 6.4 oz)   Wt Readings  from Last 3 Encounters:  05/05/18 57.3 kg (126 lb 6.4 oz)  05/01/18 54.8 kg (120 lb 14.4 oz)  04/16/18 65.2 kg (143 lb 12.8 oz)   PHYSICAL EXAM: General: Thin. No resp difficulty. Arrived in wheelchair HEENT: Normal Neck: Supple. JVP 5-6. Carotids 2+ bilat; no bruits. No thyromegaly or nodule noted. Cor: PMI nondisplaced. RRR, No M/G/R noted. Right IJ HD cath Lungs: CTAB, normal effort. Abdomen: Soft, non-tender, non-distended, no HSM. No bruits or masses. +BS  Extremities: No cyanosis, clubbing, or rash. R and LLE no edema. LUE nonpitting edema. Tender to touch. +bruit and thrill. 1+ radial pulse.  Neuro: Alert & orientedx3, cranial nerves grossly intact. moves all 4 extremities w/o difficulty. Affect pleasant   ASSESSMENT & PLAN: 1. Chronic combined CHF: Echo (8/19, post-op) with EF 45-50%.  Has St Jude CRT-P device due to post-op complete heart block.  - Echo 04/18/18 EF 30-35%, grade 2 DD, RV normal - NYHA III - Volume stable on exam. Managed by HD - Continue Coreg 25 mg BID - Increase hydralazine to 75 mg TID, continue imdur 120 mg daily 2. ESRD:  Had R hydronephrosis in setting of hydronephrosis and required percutaneous drain in 9/18 (removed). Renal US 04/14/17 showed no further evidence of hydronephrosis.   - Now on HD MWF - S/p AVF on 10/25 3. HTN:  - Elevated today. She tells me she runs 180/100s at home.  - Increase hydralazine to 75 mg TID, continue imdur 120 mg daily - Continue Coreg 25 mg BID 4.  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  Had bioprosthetic MVR in 8/19.  Post-op echo in 8/19 showed stable bioprosthetic mitral valve.  5. Atrial fibrillation: Paroxysmal.  Now s/p Maze in 8/19. Regular on exam.  - Continue warfarin. Denies bleeding. 6. CAD:  LHC in 1/19 showed severe ostial LCx disease and moderate LAD disease. She had LIMA-LAD and SVG-OM in 8/19. Has had atypical CP that is not associated with activity. It is unchanged.  - Continue ASA 81 and  atorvastatin 80 mg daily. Good lipids 03/2018 - Continue Imdur 120 mg daily and Coreg 25 mg BID 7. LUE swelling - Since AVF. + bruit and thrill. 1+ radial pulse - She sees VVS tomorrow.    Increase hydralazine to 75 mg TID Follow up 2-3 months with Dr Matilde Bash, NP 05/05/18   Greater than 50% of the 25 minute visit was spent in counseling/coordination of care regarding disease state education, salt/fluid restriction, sliding scale diuretics, and medication compliance.

## 2018-05-05 ENCOUNTER — Ambulatory Visit (HOSPITAL_COMMUNITY)
Admission: RE | Admit: 2018-05-05 | Discharge: 2018-05-05 | Disposition: A | Discharge status: Home / Self Care | Payer: Medicare Other | Source: Ambulatory Visit

## 2018-05-05 ENCOUNTER — Ambulatory Visit (HOSPITAL_COMMUNITY)
Admission: RE | Admit: 2018-05-05 | Discharge: 2018-05-05 | Disposition: A | Discharge status: Home / Self Care | Payer: Medicare Other | Source: Ambulatory Visit | Attending: Cardiology | Admitting: Cardiology

## 2018-05-05 ENCOUNTER — Other Ambulatory Visit: Payer: Self-pay

## 2018-05-05 VITALS — BP 172/98 | HR 86 | Wt 126.4 lb

## 2018-05-05 DIAGNOSIS — I480 Paroxysmal atrial fibrillation: Secondary | ICD-10-CM | POA: Insufficient documentation

## 2018-05-05 DIAGNOSIS — Z951 Presence of aortocoronary bypass graft: Secondary | ICD-10-CM | POA: Diagnosis not present

## 2018-05-05 DIAGNOSIS — F419 Anxiety disorder, unspecified: Secondary | ICD-10-CM | POA: Diagnosis not present

## 2018-05-05 DIAGNOSIS — E785 Hyperlipidemia, unspecified: Secondary | ICD-10-CM | POA: Diagnosis not present

## 2018-05-05 DIAGNOSIS — I252 Old myocardial infarction: Secondary | ICD-10-CM | POA: Diagnosis not present

## 2018-05-05 DIAGNOSIS — Z7982 Long term (current) use of aspirin: Secondary | ICD-10-CM | POA: Diagnosis not present

## 2018-05-05 DIAGNOSIS — I2510 Atherosclerotic heart disease of native coronary artery without angina pectoris: Secondary | ICD-10-CM | POA: Insufficient documentation

## 2018-05-05 DIAGNOSIS — I5022 Chronic systolic (congestive) heart failure: Secondary | ICD-10-CM | POA: Diagnosis not present

## 2018-05-05 DIAGNOSIS — K219 Gastro-esophageal reflux disease without esophagitis: Secondary | ICD-10-CM | POA: Insufficient documentation

## 2018-05-05 DIAGNOSIS — Z79890 Hormone replacement therapy (postmenopausal): Secondary | ICD-10-CM | POA: Insufficient documentation

## 2018-05-05 DIAGNOSIS — I132 Hypertensive heart and chronic kidney disease with heart failure and with stage 5 chronic kidney disease, or end stage renal disease: Secondary | ICD-10-CM | POA: Insufficient documentation

## 2018-05-05 DIAGNOSIS — I10 Essential (primary) hypertension: Secondary | ICD-10-CM

## 2018-05-05 DIAGNOSIS — N186 End stage renal disease: Secondary | ICD-10-CM | POA: Diagnosis not present

## 2018-05-05 DIAGNOSIS — N1330 Unspecified hydronephrosis: Secondary | ICD-10-CM | POA: Insufficient documentation

## 2018-05-05 DIAGNOSIS — I5042 Chronic combined systolic (congestive) and diastolic (congestive) heart failure: Secondary | ICD-10-CM | POA: Diagnosis not present

## 2018-05-05 DIAGNOSIS — E1122 Type 2 diabetes mellitus with diabetic chronic kidney disease: Secondary | ICD-10-CM | POA: Insufficient documentation

## 2018-05-05 DIAGNOSIS — M7989 Other specified soft tissue disorders: Secondary | ICD-10-CM | POA: Diagnosis not present

## 2018-05-05 DIAGNOSIS — I340 Nonrheumatic mitral (valve) insufficiency: Secondary | ICD-10-CM | POA: Diagnosis not present

## 2018-05-05 DIAGNOSIS — Z79899 Other long term (current) drug therapy: Secondary | ICD-10-CM | POA: Diagnosis not present

## 2018-05-05 MED ORDER — HYDRALAZINE HCL 50 MG PO TABS: 75.0000 mg | ORAL_TABLET | Freq: Three times a day (TID) | ORAL | Status: DC

## 2018-05-05 NOTE — Patient Instructions (Signed)
INCREASE Hydralazine to 75 mg (one and one half tabs) three times per daY   Your physician recommends that you schedule a follow-up appointment in: 2-3 months with Dr Aundra Dubin   Do the following things EVERYDAY: 1) Weigh yourself in the morning before breakfast. Write it down and keep it in a log. 2) Take your medicines as prescribed 3) Eat low salt foods-Limit salt (sodium) to 2000 mg per day.  4) Stay as active as you can everyday 5) Limit all fluids for the day to less than 2 liters

## 2018-05-06 ENCOUNTER — Other Ambulatory Visit: Payer: Self-pay

## 2018-05-06 ENCOUNTER — Ambulatory Visit (INDEPENDENT_AMBULATORY_CARE_PROVIDER_SITE_OTHER): Payer: Self-pay | Admitting: Physician Assistant

## 2018-05-06 VITALS — BP 144/68 | HR 79 | Temp 99.4°F | Resp 18 | Ht 69.0 in | Wt 127.0 lb

## 2018-05-06 DIAGNOSIS — N186 End stage renal disease: Secondary | ICD-10-CM

## 2018-05-06 DIAGNOSIS — Z992 Dependence on renal dialysis: Secondary | ICD-10-CM

## 2018-05-06 NOTE — Progress Notes (Signed)
POST OPERATIVE OFFICE NOTE    CC:  F/u for surgery  HPI:  This is a 67 y.o. female who is s/p Left upper arm arteriovenous graft (4 mm x 7 mm tapered Goretex graft).  She is here today for f/u.  She states she has had swelling throughout the left UE post op.  She denise pain, increased numbness or loss of motor in the left UE since surgery as of today.  She has known numbness in B UE secondary to DM.  Allergies  Allergen Reactions  . Ace Inhibitors Anaphylaxis and Swelling  . Motrin Ib [Ibuprofen] Anaphylaxis    Current Outpatient Medications  Medication Sig Dispense Refill  . acetaminophen (TYLENOL) 325 MG tablet Take 2 tablets (650 mg total) by mouth every 6 (six) hours as needed for mild pain, fever or headache. 30 tablet 0  . albuterol (PROVENTIL HFA;VENTOLIN HFA) 108 (90 Base) MCG/ACT inhaler Inhale 2 puffs into the lungs every 6 (six) hours as needed for wheezing or shortness of breath.    . ALPRAZolam (XANAX) 0.25 MG tablet Take 1 tablet (0.25 mg total) by mouth at bedtime as needed for anxiety. 30 tablet 0  . aspirin EC 81 MG EC tablet Take 1 tablet (81 mg total) by mouth daily. 30 tablet 0  . atorvastatin (LIPITOR) 80 MG tablet Take 1 tablet (80 mg total) by mouth daily. 30 tablet 3  . b complex vitamins tablet Take 1 tablet by mouth daily.    Marland Kitchen buPROPion (WELLBUTRIN) 100 MG tablet Take 100 mg by mouth 3 (three) times daily.    . busPIRone (BUSPAR) 15 MG tablet Take 1 tablet (15 mg total) by mouth 2 (two) times daily. 60 tablet 0  . carvedilol (COREG) 25 MG tablet Take 1 tablet (25 mg total) by mouth 2 (two) times daily.    . cholecalciferol (VITAMIN D) 1000 units tablet Take 1,000 Units by mouth daily.    . DULoxetine (CYMBALTA) 20 MG capsule Take 1 capsule (20 mg total) by mouth daily.  3  . hydrALAZINE (APRESOLINE) 50 MG tablet Take 1.5 tablets (75 mg total) by mouth every 8 (eight) hours.    Marland Kitchen ipratropium-albuterol (DUONEB) 0.5-2.5 (3) MG/3ML SOLN Take 3 mLs by nebulization  every 6 (six) hours as needed.    . isosorbide mononitrate (IMDUR) 120 MG 24 hr tablet Take 1 tablet (120 mg total) by mouth daily. 30 tablet 0  . levothyroxine (SYNTHROID, LEVOTHROID) 112 MCG tablet Take 112 mcg by mouth daily before breakfast.    . montelukast (SINGULAIR) 10 MG tablet Take 1 tablet (10 mg total) by mouth daily. 30 tablet 0  . multivitamin (RENA-VIT) TABS tablet Take 1 tablet by mouth at bedtime.  0  . Nutritional Supplements (FEEDING SUPPLEMENT, NEPRO CARB STEADY,) LIQD Take 237 mLs by mouth 3 (three) times daily between meals.  0  . ondansetron (ZOFRAN) 4 MG tablet Take 4 mg by mouth every 6 (six) hours as needed for nausea or vomiting.    Marland Kitchen oxyCODONE (OXY IR/ROXICODONE) 5 MG immediate release tablet Take 5 mg by mouth every 8 (eight) hours as needed for severe pain.    . pantoprazole (PROTONIX) 40 MG tablet Take 1 tablet (40 mg total) by mouth 2 (two) times daily before a meal. 60 tablet 0  . polyethylene glycol (MIRALAX / GLYCOLAX) packet Take 17 g by mouth daily. 14 each 0  . traZODone (DESYREL) 25 mg TABS tablet Take 25 mg by mouth at bedtime.    Marland Kitchen  warfarin (COUMADIN) 2.5 MG tablet Take 1 tablet (2.5 mg total) by mouth daily at 6 PM. 30 tablet 3   No current facility-administered medications for this visit.      ROS:  See HPI  Physical Exam:  Vitals:   05/06/18 1250  BP: (!) 144/68  Pulse: 79  Resp: 18  Temp: 99.4 F (37.4 C)  SpO2: 97%    Incision:  Incisions are well healed, minimal localized edema distal graft anastomosis area superior to the [facility].  Soft without frank hematoma.  Grip 5/5, palpable thrill in graft.     Assessment/Plan:  This is a 67 y.o. female who is s/p: Left UE AVGG   The edema is decreasing and now isolated to the distal upper arm.  Soft without hematoma.  Her hand has no edema with good skin lines.  The graft may be accessed Nov. 25 th or later.  Once it is functioning well the [facility] may be discontinued.  F/U PRN   Roxy Horseman , PA-C [facility] [phone]

## 2018-05-11 ENCOUNTER — Encounter: Payer: Self-pay | Admitting: Cardiology

## 2018-05-13 ENCOUNTER — Encounter (HOSPITAL_COMMUNITY): Payer: Self-pay | Admitting: Vascular Surgery

## 2018-05-17 ENCOUNTER — Other Ambulatory Visit (HOSPITAL_COMMUNITY): Payer: Self-pay | Admitting: Cardiology

## 2018-06-01 ENCOUNTER — Encounter: Payer: Self-pay | Admitting: Cardiology

## 2018-06-01 NOTE — Progress Notes (Deleted)
Electrophysiology Office Note   Date:  06/01/2018   ID:  ELKY FUNCHES, DOB Sep 01, 1950, MRN [id number]  PCP:  [name], MD  Cardiologist:  Aundra Dubin Primary Electrophysiologist:  Constance Haw, MD    No chief complaint on file.    History of Present Illness: Jocelyn Hill is a 67 y.o. female who is being seen today for the evaluation of complete AV block at the request of Antil, Venia Carbon, MD. Presenting today for electrophysiology evaluation.  She has a history of mitral regurgitation, chronic diastolic heart failure, CAD status post MI, long-standing hypertension, diabetes, CKD stage IV-V, and stroke.  She presented to [facility] 03/12/2017 with hypoxic respiratory failure.  Echo showed normal LV systolic function with severe MR.  She was diuresed with Lasix over 20 pounds.  She was thought not to be a candidate for MVR at the time.  Hospital course was complicated by respiratory failure, renal failure, atrial fibrillation, retroperitoneal bleed, and right hydronephrosis.  She also had E. coli bacteremia.  She was admitted 813/19 through 03/05/2018 for CABG and MVR with bioprosthetic valve.  She underwent CRT-P implant due to complete heart block on 02/26/2018.  Today, denies symptoms of palpitations, chest pain, shortness of breath, orthopnea, PND, lower extremity edema, claudication, dizziness, presyncope, syncope, bleeding, or neurologic sequela. The patient is tolerating medications without difficulties. ***    Past Medical History:  Diagnosis Date  . Anemia    low iron  . Anxiety   . Arthritis   . Bronchitis   . CHF (congestive heart failure) ([place])   . CKD (chronic kidney disease) stage 4, GFR 15-29 ml/min (HCC) 03/16/2017  . Coronary artery disease   . Depression   . Diabetes mellitus without complication ([place])   . Diet-controlled diabetes mellitus ([place])   . Ganglion cyst    left wrist  . GERD (gastroesophageal reflux disease)   . Headache    chronic  headaches  . Heart failure ([place])   . Hyperlipidemia   . Hypertension   . Hypothyroidism   . Mitral regurgitation   . Myocardial infarction ([place])    3x last one 2008  . PAF (paroxysmal atrial fibrillation) ([place])   . Pneumonia    x 2  . S/P Maze operation for atrial fibrillation 02/18/2018   Complete bilateral atrial lesion set using bipolar radiofrequency and cryothermy ablation with clipping of LA appendage  . S/P mitral valve replacement with bioprosthetic valve 02/18/2018   [facility] Mitral stented bovine pericardial tissue valve Model 7300 TFX Serial # [id number] Size 29  . Stroke [facility])    no lasting residual - ? 2014  . Umbilical hernia    Past Surgical History:  Procedure Laterality Date  . ABDOMINAL HYSTERECTOMY     PARTIALS  . AV FISTULA PLACEMENT Left 04/23/2018   Procedure: LEFT ARTERIOVENOUS (AV) GRAFT CREATION;  Surgeon: Marty Heck, MD;  Location: [place];  Service: Vascular;  Laterality: Left;  . BIV PACEMAKER INSERTION CRT-P N/A 02/26/2018   Procedure: BIV PACEMAKER INSERTION CRT-P;  Surgeon: Constance Haw, MD;  Location: [place] CV LAB;  Service: Cardiovascular;  Laterality: N/A;  . CARDIAC CATHETERIZATION    . CESAREAN SECTION    . CORONARY ARTERY BYPASS GRAFT N/A 02/18/2018   Procedure: CORONARY ARTERY BYPASS GRAFTING (CABG) WITH IMA. ENDOSCOPIC VEIN HARVEST. IMA TO LAD, SVG TO CIRC.;  Surgeon: Rexene Alberts, MD;  Location: [place];  Service: Open Heart Surgery;  Laterality: N/A;  .  INSERTION OF DIALYSIS CATHETER N/A 02/18/2018   Procedure: PLACEMENT OF TEMPORARY HD CATHETER, POWER TRIALYSIS 13FR 20CM;  Surgeon: Rexene Alberts, MD;  Location: [place];  Service: Vascular;  Laterality: N/A;  . INSERTION OF DIALYSIS CATHETER Right 04/23/2018   Procedure: INSERTION OF DIALYSIS CATHETER;  Surgeon: Marty Heck, MD;  Location: [place];  Service: Vascular;  Laterality: Right;  . IR NEPHRO TUBE REMOV/FL  04/02/2017  . IR NEPHROSTOGRAM RIGHT THRU  EXISTING ACCESS  04/02/2017  . IR NEPHROSTOMY PLACEMENT RIGHT  03/27/2017  . IR THORACENTESIS ASP PLEURAL SPACE W/IMG GUIDE  03/24/2017  . MAZE N/A 02/18/2018   Procedure: MAZE;  Surgeon: Rexene Alberts, MD;  Location: [place];  Service: Open Heart Surgery;  Laterality: N/A;  . MITRAL VALVE REPLACEMENT N/A 02/18/2018   Procedure: MITRAL VALVE (MV) REPLACEMENT;  Surgeon: Rexene Alberts, MD;  Location: [place];  Service: Open Heart Surgery;  Laterality: N/A;  . MULTIPLE EXTRACTIONS WITH ALVEOLOPLASTY N/A 11/05/2017   Procedure: Extraction of tooth #'s 4,5,7,8,9,10,12,14 and 29 with alveoloplasty and gross debridement of remaining teeth;  Surgeon: [name], DDS;  Location: [place];  Service: Oral Surgery;  Laterality: N/A;  . PATENT FORAMEN OVALE(PFO) CLOSURE N/A 02/18/2018   Procedure: PATENT FORAMEN OVALE (PFO) CLOSURE;  Surgeon: Rexene Alberts, MD;  Location: [place];  Service: Open Heart Surgery;  Laterality: N/A;  . RIGHT/LEFT HEART CATH AND CORONARY ANGIOGRAPHY N/A 07/03/2017   Procedure: RIGHT/LEFT HEART CATH AND CORONARY ANGIOGRAPHY;  Surgeon: Larey Dresser, MD;  Location: [place] CV [facility];  Service: Cardiovascular;  Laterality: N/A;  . TEE WITHOUT CARDIOVERSION N/A 10/08/2017   Procedure: TRANSESOPHAGEAL ECHOCARDIOGRAM (TEE);  Surgeon: Larey Dresser, MD;  Location: [facility] ENDOSCOPY;  Service: Cardiovascular;  Laterality: N/A;  . TEE WITHOUT CARDIOVERSION N/A 02/18/2018   Procedure: TRANSESOPHAGEAL ECHOCARDIOGRAM (TEE);  Surgeon: Rexene Alberts, MD;  Location: [place];  Service: Open Heart Surgery;  Laterality: N/A;  . TONSILLECTOMY    . TUBAL LIGATION       Current Outpatient Medications  Medication Sig Dispense Refill  . acetaminophen (TYLENOL) 325 MG tablet Take 2 tablets (650 mg total) by mouth every 6 (six) hours as needed for mild pain, fever or headache. 30 tablet 0  . albuterol (PROVENTIL HFA;VENTOLIN HFA) 108 (90 Base) MCG/ACT inhaler Inhale 2 puffs into the lungs every 6 (six)  hours as needed for wheezing or shortness of breath.    . ALPRAZolam (XANAX) 0.25 MG tablet Take 1 tablet (0.25 mg total) by mouth at bedtime as needed for anxiety. 30 tablet 0  . aspirin EC 81 MG EC tablet Take 1 tablet (81 mg total) by mouth daily. 30 tablet 0  . atorvastatin (LIPITOR) 80 MG tablet Take 1 tablet (80 mg total) by mouth daily. 30 tablet 3  . b complex vitamins tablet Take 1 tablet by mouth daily.    Mar[place] Kitchen buPROPion (WELLBUTRIN) 100 MG tablet Take 100 mg by mouth 3 (three) times daily.    . busPIRone (BUSPAR) 15 MG tablet Take 1 tablet (15 mg total) by mouth 2 (two) times daily. 60 tablet 0  . carvedilol (COREG) 25 MG tablet Take 1 tablet (25 mg total) by mouth 2 (two) times daily.    . cholecalciferol (VITAMIN D) 1000 units tablet Take 1,000 Units by mouth daily.    . DULoxetine (CYMBALTA) 20 MG capsule Take 1 capsule (20 mg total) by mouth daily.  3  . hydrALAZINE (APRESOLINE) 50 MG tablet  Take 1.5 tablets (75 mg total) by mouth every 8 (eight) hours.    Mar[place] Kitchen ipratropium-albuterol (DUONEB) 0.5-2.5 (3) MG/3ML SOLN Take 3 mLs by nebulization every 6 (six) hours as needed.    . isosorbide mononitrate (IMDUR) 120 MG 24 hr tablet Take 1 tablet (120 mg total) by mouth daily. 30 tablet 0  . levothyroxine (SYNTHROID, LEVOTHROID) 112 MCG tablet Take 112 mcg by mouth daily before breakfast.    . montelukast (SINGULAIR) 10 MG tablet Take 1 tablet (10 mg total) by mouth daily. 30 tablet 0  . multivitamin (RENA-VIT) TABS tablet Take 1 tablet by mouth at bedtime.  0  . Nutritional Supplements (FEEDING SUPPLEMENT, NEPRO CARB STEADY,) LIQD Take 237 mLs by mouth 3 (three) times daily between meals.  0  . ondansetron (ZOFRAN) 4 MG tablet Take 4 mg by mouth every 6 (six) hours as needed for nausea or vomiting.    [place] Kitchen oxyCODONE (OXY IR/ROXICODONE) 5 MG immediate release tablet Take 5 mg by mouth every 8 (eight) hours as needed for severe pain.    . pantoprazole (PROTONIX) 40 MG tablet Take 1 tablet (40 mg  total) by mouth 2 (two) times daily before a meal. 60 tablet 0  . polyethylene glycol (MIRALAX / GLYCOLAX) packet Take 17 g by mouth daily. 14 each 0  . traZODone (DESYREL) 25 mg TABS tablet Take 25 mg by mouth at bedtime.    Mar[place] Kitchen warfarin (COUMADIN) 2.5 MG tablet Take 1 tablet (2.5 mg total) by mouth daily at 6 PM. 30 tablet 3   No current facility-administered medications for this visit.     Allergies:   Ace inhibitors and Motrin ib [ibuprofen]   Social History:  The patient  reports that she has quit smoking. Her smoking use included cigarettes. She has never used smokeless tobacco. She reports that she does not drink alcohol or use drugs.   Family History:  The patient's family history includes Hypertension in her mother.    ROS:  Please see the history of present illness.   Otherwise, review of systems is positive for ***.   All other systems are reviewed and negative.   PHYSICAL EXAM: VS:  There were no vitals taken for this visit. , BMI There is no height or weight on file to calculate BMI. GEN: Well nourished, well developed, in no acute distress  HEENT: normal  Neck: no JVD, carotid bruits, or masses Cardiac: ***RRR; no murmurs, rubs, or gallops,no edema  Respiratory:  clear to auscultation bilaterally, normal work of breathing GI: soft, nontender, nondistended, + BS MS: no deformity or atrophy  Skin: warm and dry, ***device site well healed Neuro:  Strength and sensation are intact Psych: euthymic mood, full affect  EKG:  EKG {ACTION; IS/IS KPT:[id number]} ordered today. Personal review of the ekg ordered *** shows ***  ***Personal review of the device interrogation today. Results in [place]: 04/16/2018: ALT 15; B Natriuretic Peptide 2,442.0 04/17/2018: TSH 2.745 04/22/2018: Magnesium 2.0 04/30/2018: BUN 24; Creatinine, Ser 3.52; Hemoglobin 9.5; Platelets 219; Potassium 3.4; Sodium 134    Lipid Panel     Component Value Date/Time   CHOL 89 04/02/2018  1457   TRIG 63 04/02/2018 1457   HDL 46 04/02/2018 1457   CHOLHDL 1.9 04/02/2018 1457   VLDL 13 04/02/2018 1457   LDLCALC 30 04/02/2018 1457     Wt Readings from Last 3 Encounters:  05/06/18 127 lb (57.6 kg)  05/05/18 126 lb 6.4 oz (57.3 kg)  05/01/18 120 lb 14.4 oz (54.8 kg)      Other studies Reviewed: Additional studies/ records that were reviewed today include: TTE 02/22/18  Review of the above records today demonstrates:  - Left ventricle: The cavity size was normal. Wall thickness was   increased in a pattern of mild LVH. Global hypokinesis with   incoordinate septal motion. Systolic function was mildly reduced.   The estimated ejection fraction was in the range of 45% to 50%.   The study is not technically sufficient to allow evaluation of LV   diastolic function. - Mitral valve: Bioprosthetic MVR. No obstruction. Mobile   subvalvular density - may be ruptured cord or less likely   thrombus. Mean gradient (D): 3 mm Hg. Valve area by continuity   equation (using LVOT flow): 1.87 cm^2. - Left atrium: Severely dilated. - Right ventricle: The cavity size was mildly dilated. Mildly   reduced systolic function. - Right atrium: Moderately dilated. - Atrial septum: No defect or patent foramen ovale was identified.   s/p PFO closure. - Tricuspid valve: There was moderate regurgitation. - Pulmonary arteries: PA peak pressure: 32 mm Hg (S). - Pericardium, extracardiac: A trivial pericardial effusion was   identified posterior to the heart. Features were not consistent   with tamponade physiology. There was a left pleural effusion.   ASSESSMENT AND PLAN:  1.  Complete heart block: ***Status post [employer] Jude CRT-P implanted 02/26/2018.  Device functioning appropriately.  No changes.  2.  Chronic diastolic heart failure: ***Echo with EF of 45 to 50%.  Volume status per heart failure.  3.  Mitral regurgitation, severe: ***Status post bioprosthetic MVR/maze 12/19/2017.  4.   Paroxysmal atrial fibrillation: ***Currently on Eliquis.  Status post maze.  This patients CHA2DS2-VASc Score and unadjusted Ischemic Stroke Rate (% per year) is equal to 4.8 % stroke rate/year from a score of 4  Above score calculated as 1 point each if present [CHF, HTN, DM, Vascular=MI/PAD/Aortic Plaque, Age if 65-74, or Female] Above score calculated as 2 points each if present [Age > 75, or Stroke/TIA/TE]  5.  Coronary artery disease: ***Status post two-vessel CABG.  No current chest pain.  6.  Left arm pain: ***At this point I do not feel like her left arm pain is due to her device implant.  On manipulation of her forearm, she had biceps pain with extension at the elbow.  It is likely that this is due to stiffening of the muscle.  Would recommend further exercises to strengthen her biceps muscles and improve range of motion in her elbow.  She can also use her shoulder muscles with exercises as per device clinic recommendations.  She was given these recommendations last week.  Current medicines are reviewed at length with the patient today.   The patient does not have concerns regarding her medicines.  The following changes were made today:  ***  Labs/ tests ordered today include:  No orders of the defined types were placed in this encounter.    Disposition:   FU with Karelly Dewalt *** months  Signed, Bailee Thall Meredith Leeds, MD  06/01/2018 8:06 AM     [facility] [address] [place] [address] [phone] (office) [phone] (fax)

## 2018-06-02 ENCOUNTER — Encounter: Payer: Self-pay | Admitting: Cardiology

## 2018-06-30 DIAGNOSIS — J189 Pneumonia, unspecified organism: Secondary | ICD-10-CM

## 2018-06-30 HISTORY — DX: Pneumonia, unspecified organism: J18.9

## 2018-07-17 IMAGING — CR DG CHEST 2V
2 series · 2 of 2 positions shown · non-contrast
Comparison: 10/19/2017.

CLINICAL DATA: Preoperative exam for mitral valve replacement.

EXAM:
CHEST - 2 VIEW

[w chest pa]
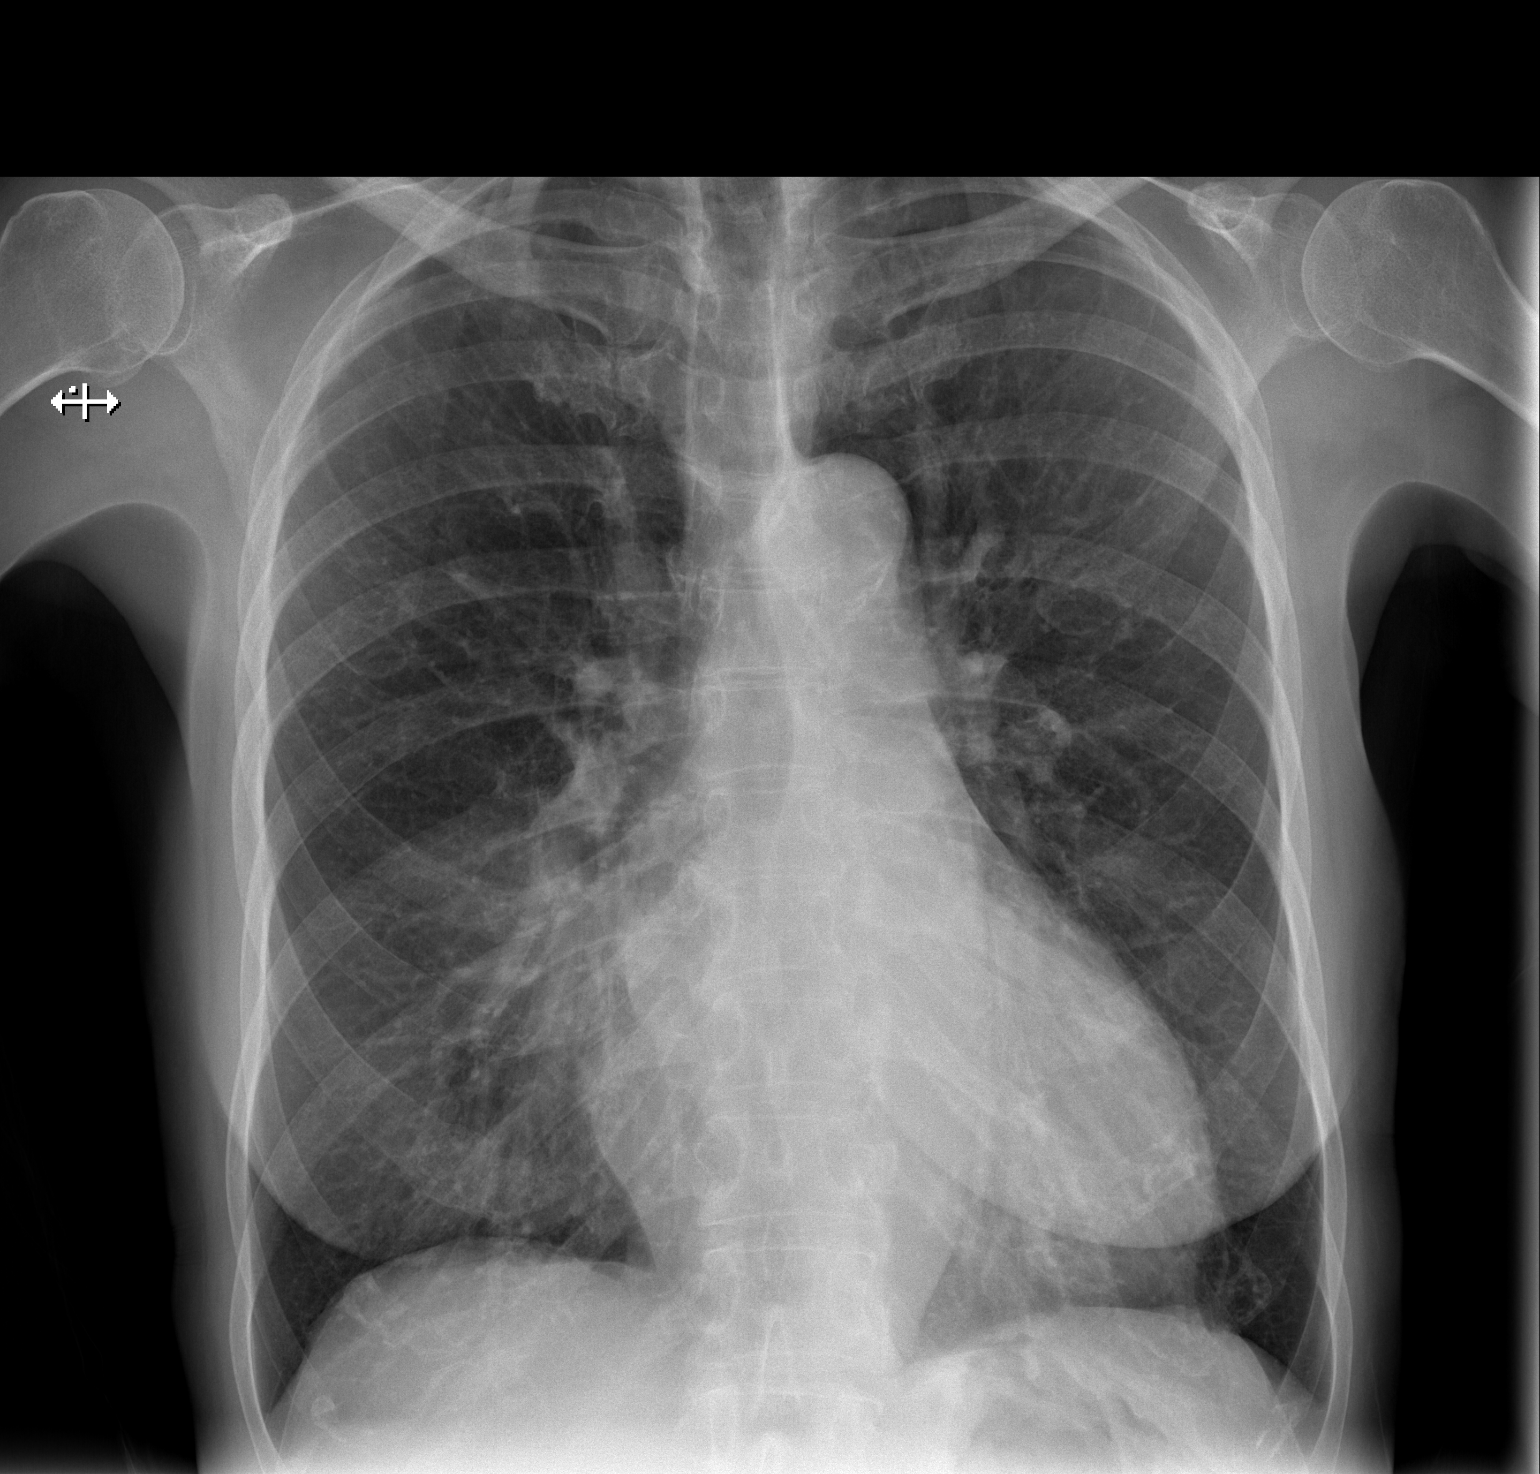

[w chest lat]
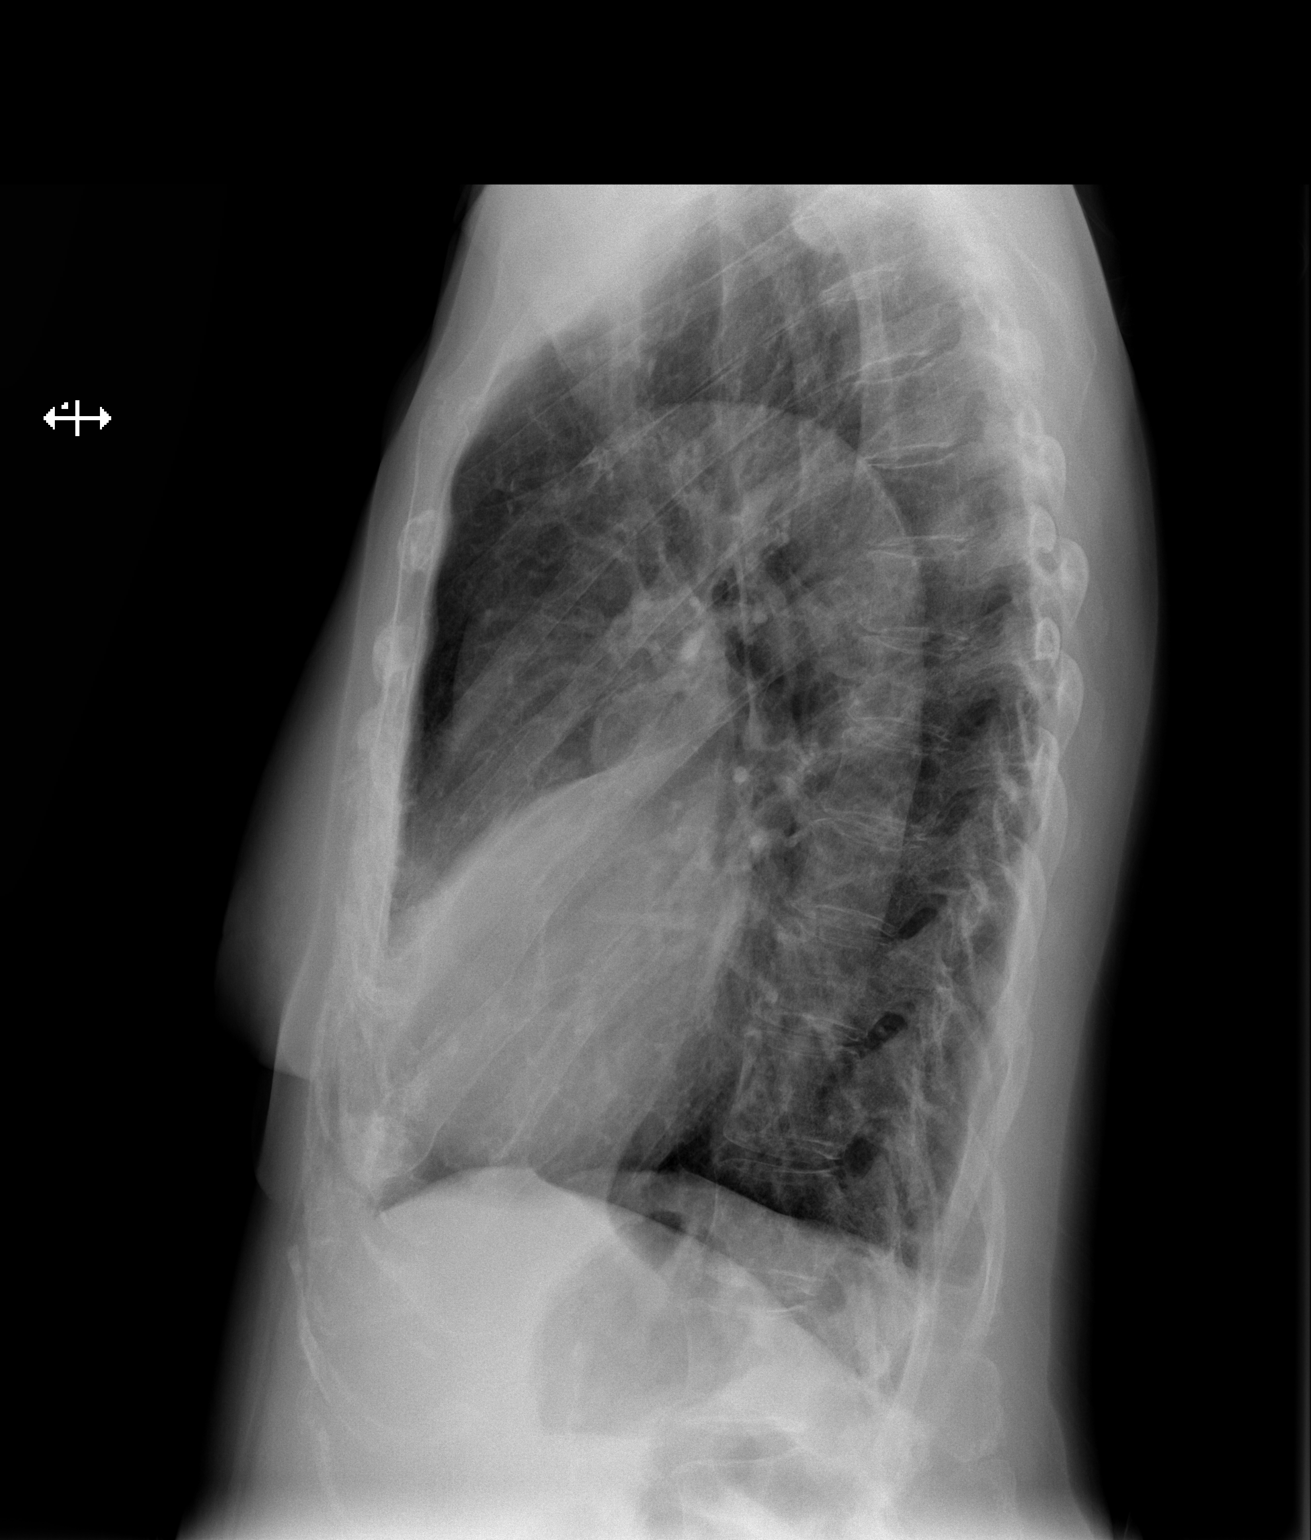

[2 of 2 positions shown; findings below may reference images not displayed]

FINDINGS: Cardiomegaly. Right middle lobe atelectasis/infiltrate. No pleural
effusion or pneumothorax. Biapical pleural thickening consistent
scarring. No acute bony abnormality.
IMPRESSION: 1.  Cardiomegaly.

2.  Right middle lobe atelectasis/infiltrate.

## 2018-07-19 DIAGNOSIS — R197 Diarrhea, unspecified: Secondary | ICD-10-CM | POA: Insufficient documentation

## 2018-07-22 ENCOUNTER — Encounter (HOSPITAL_COMMUNITY): Payer: Self-pay | Admitting: Cardiology

## 2018-07-28 DIAGNOSIS — Z23 Encounter for immunization: Secondary | ICD-10-CM

## 2018-09-14 IMAGING — DX DG CHEST 1V PORT
1 series · 1 of 1 positions shown · non-contrast
Comparison: Radiograph February 17, 2018.

CLINICAL DATA: Atelectasis.

EXAM:
PORTABLE CHEST 1 VIEW

[chest]
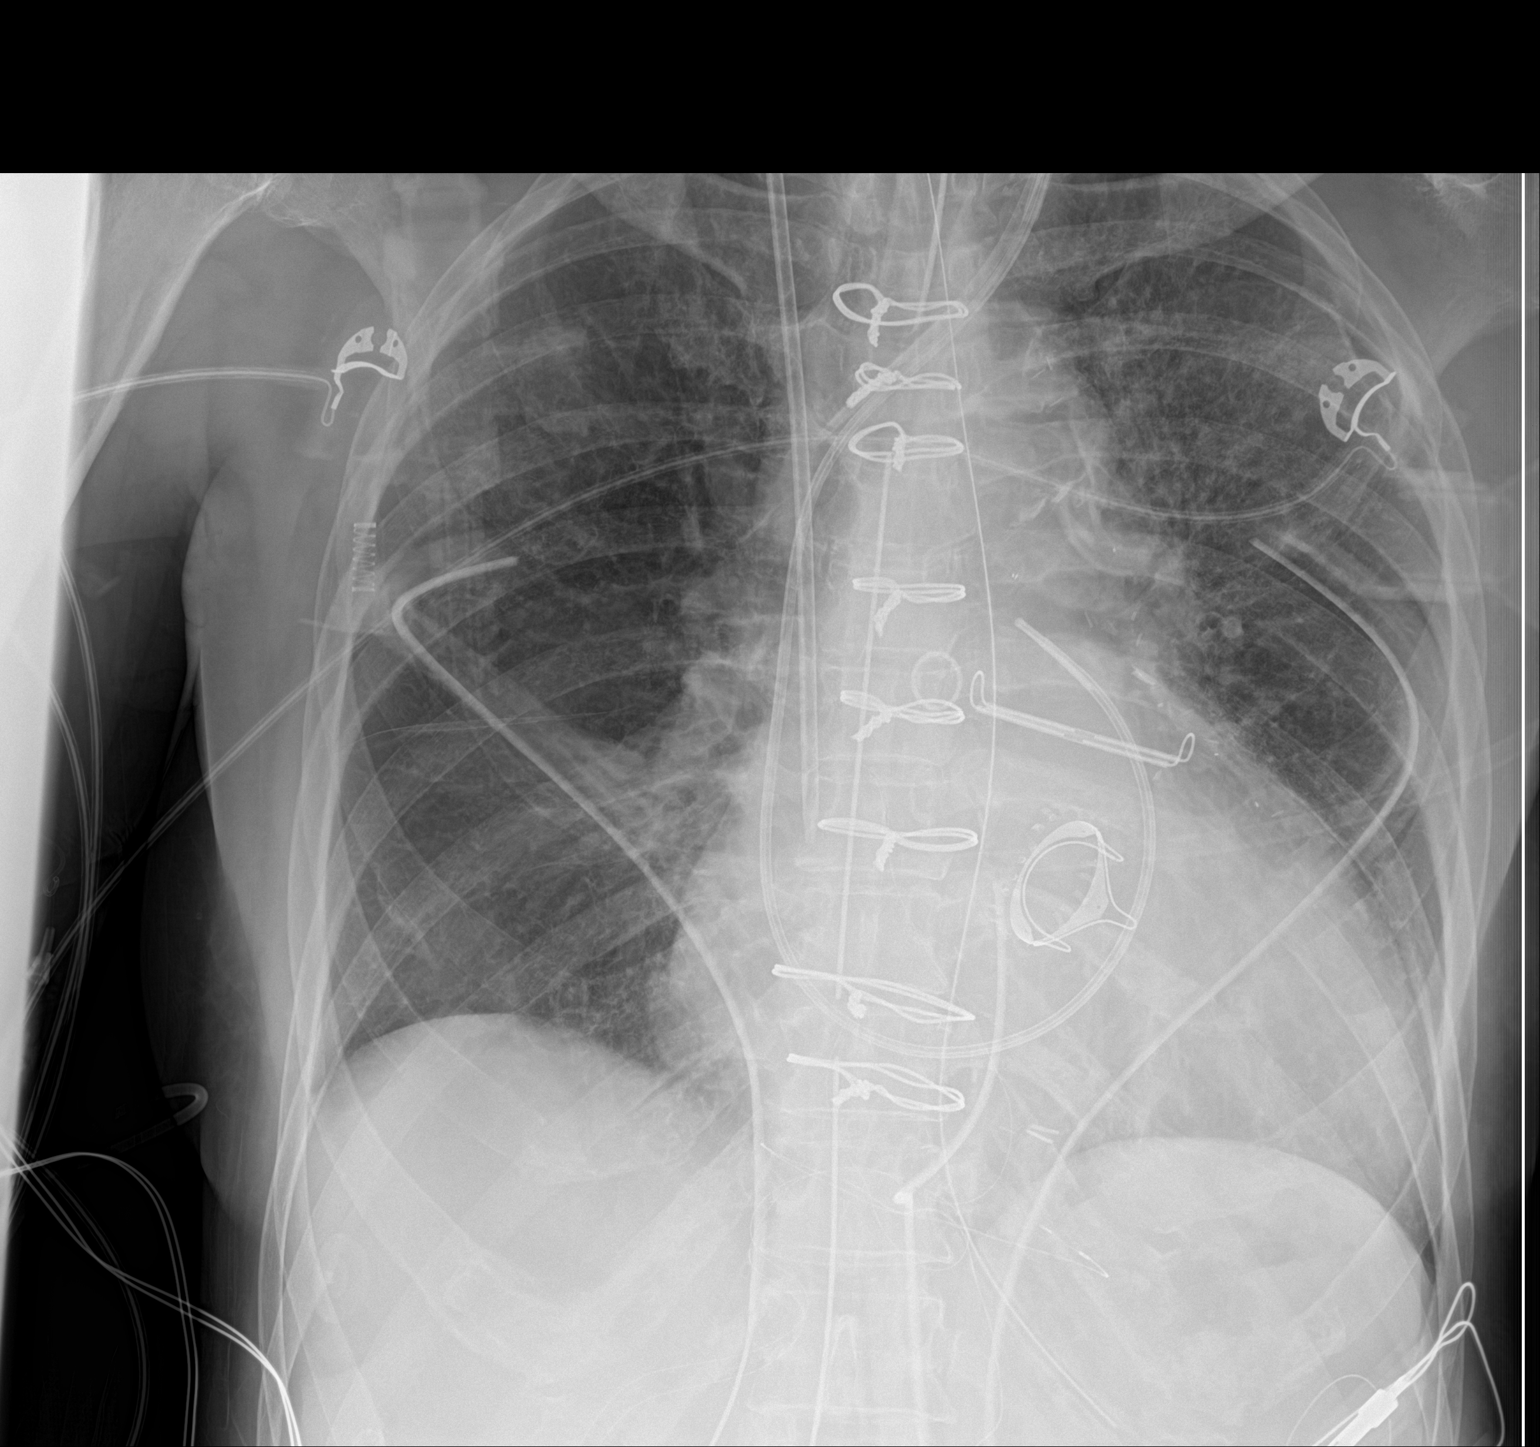

[1 of 1 positions shown; findings below may reference images not displayed]

FINDINGS: Endotracheal and nasogastric tubes are unchanged in position. Right
internal jugular catheter is noted with tip at cavoatrial junction.
Left internal jugular catheter is noted with tip in SVC. Left
internal jugular catheter is noted with tip in expected position of
main pulmonary artery. Status post cardiac valve repair. Status post
coronary bypass graft. Bilateral chest tubes are noted without
pneumothorax. No significant pleural effusion is noted. No
significant pulmonary abnormality is noted. Bony thorax is
unremarkable.
IMPRESSION: Endotracheal and nasogastric tubes are in grossly good position.
Bilateral chest tubes are noted without pneumothorax. Bilateral
internal jugular catheters are noted as well in grossly good
position.

## 2018-09-18 IMAGING — DX DG CHEST 1V PORT
1 series · 1 of 1 positions shown · non-contrast
Comparison: 02/21/2018

CLINICAL DATA: Follow-up pneumothorax

EXAM:
PORTABLE CHEST 1 VIEW

[chest ap]
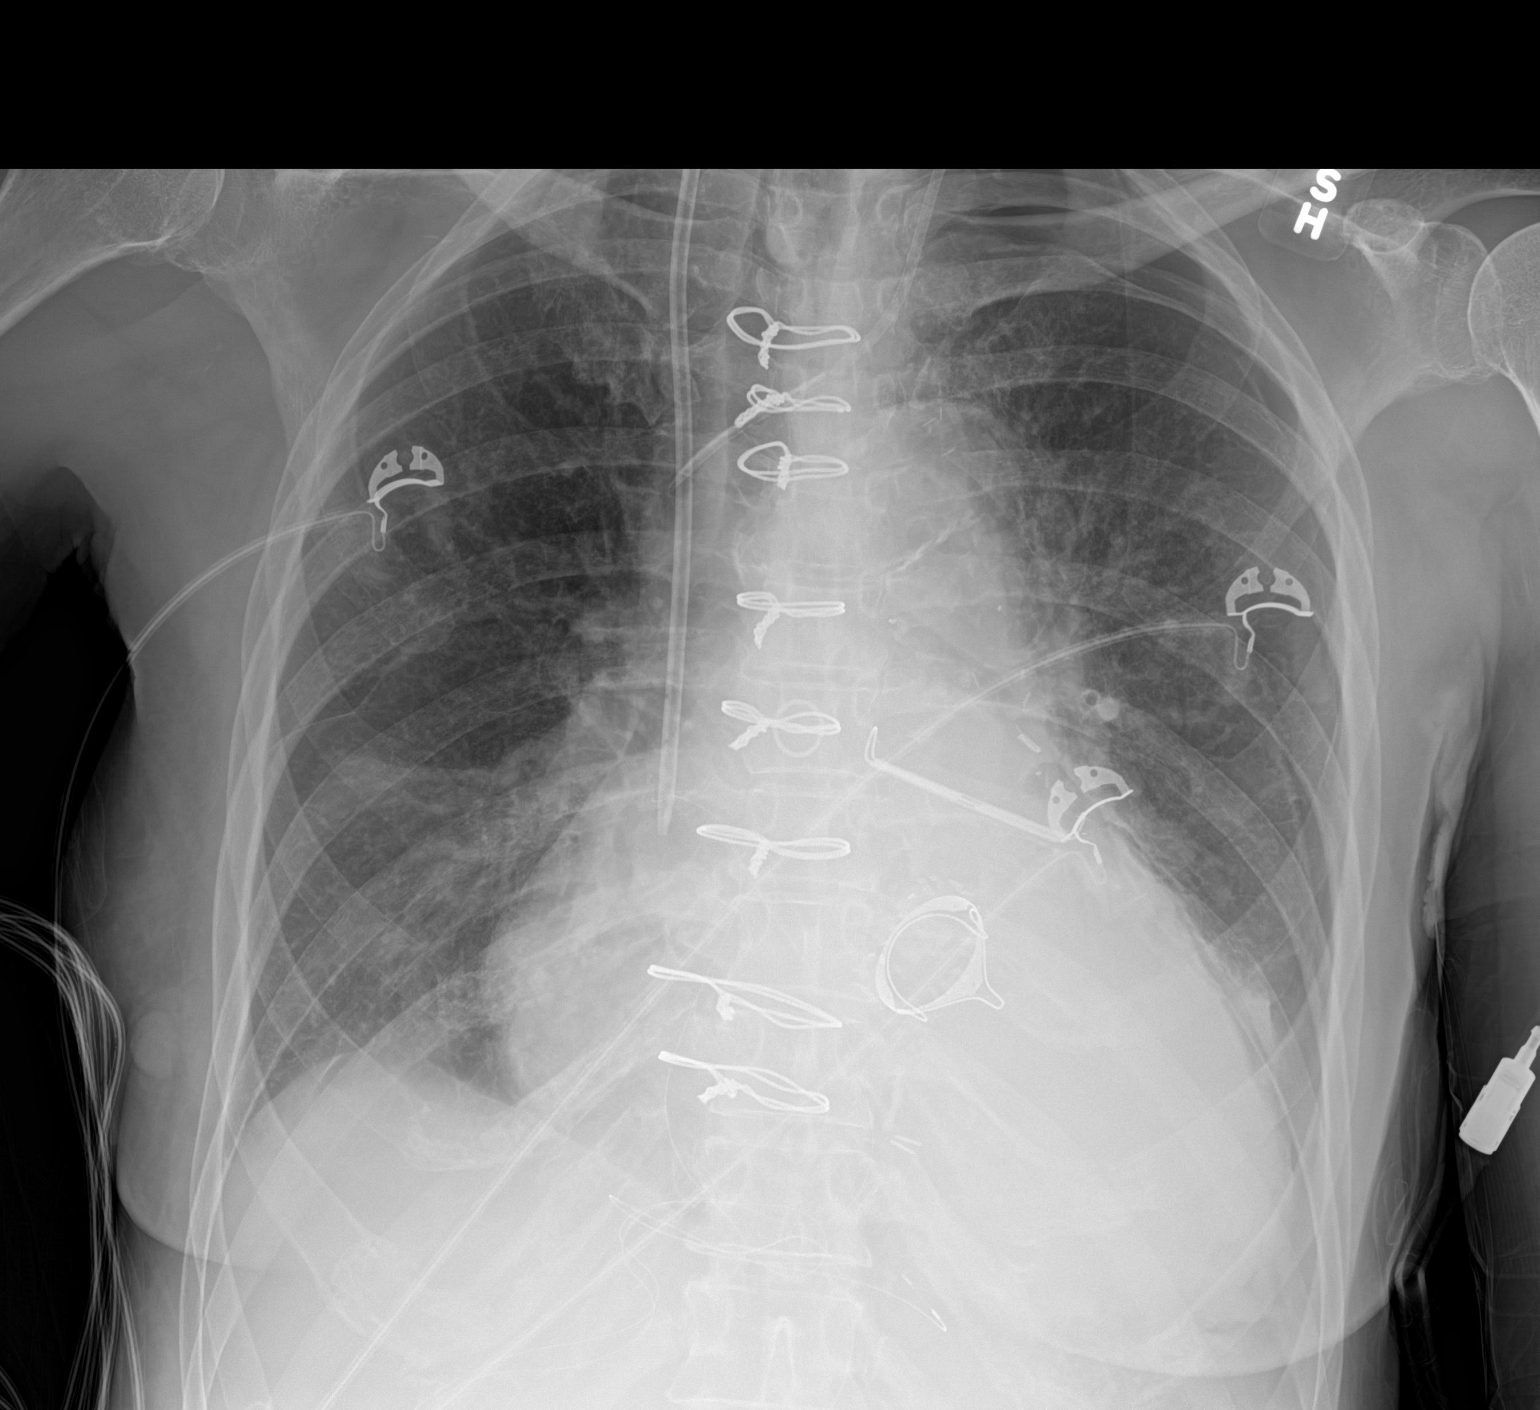

[1 of 1 positions shown; findings below may reference images not displayed]

FINDINGS: Cardiac shadow is enlarged but stable. Postsurgical changes are
again seen. Bilateral jugular catheters are again noted and stable.
The right lung is well aerated with minimal basilar atelectasis.
Left retrocardiac atelectasis is again noted. Left-sided apical
pneumothorax is noted slightly improved when compared with the prior
exam. No bony abnormality is noted.
IMPRESSION: Mild right basilar atelectasis new from the prior exam.

Stable left retrocardiac atelectasis. Some improvement in left
pneumothorax is noted.

## 2018-09-24 DIAGNOSIS — R6883 Chills (without fever): Secondary | ICD-10-CM | POA: Insufficient documentation

## 2018-10-10 IMAGING — DX DG CHEST 2V
2 series · 2 of 2 positions shown · non-contrast
Comparison: 02/27/2018, 02/25/2018, 02/24/2018, 02/21/2018

CLINICAL DATA: Status post CABG, MVR and Maze dry cough and chest
pain

EXAM:
CHEST - 2 VIEW

[dg chest 2 view (1 of 2)]
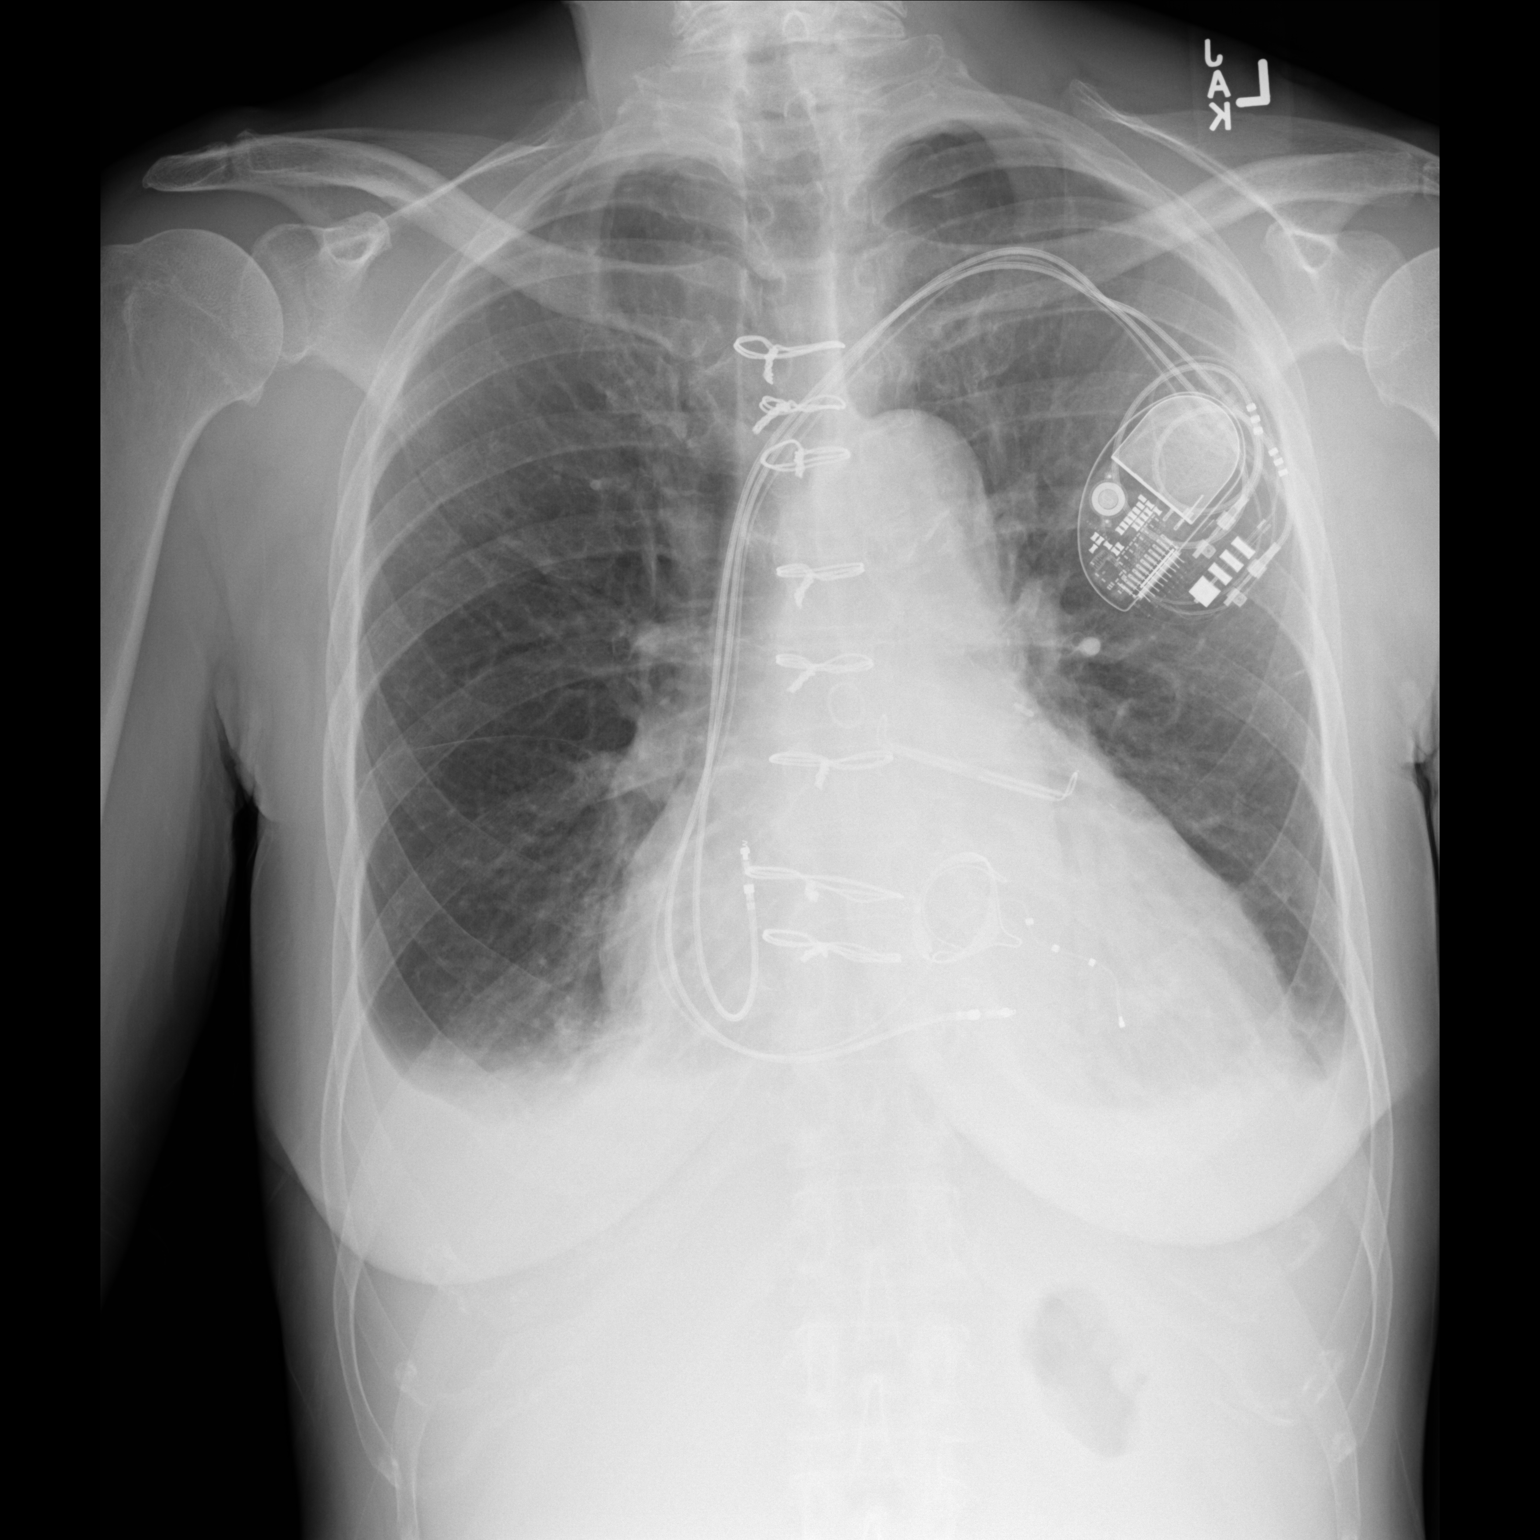

[dg chest 2 view (2 of 2)]
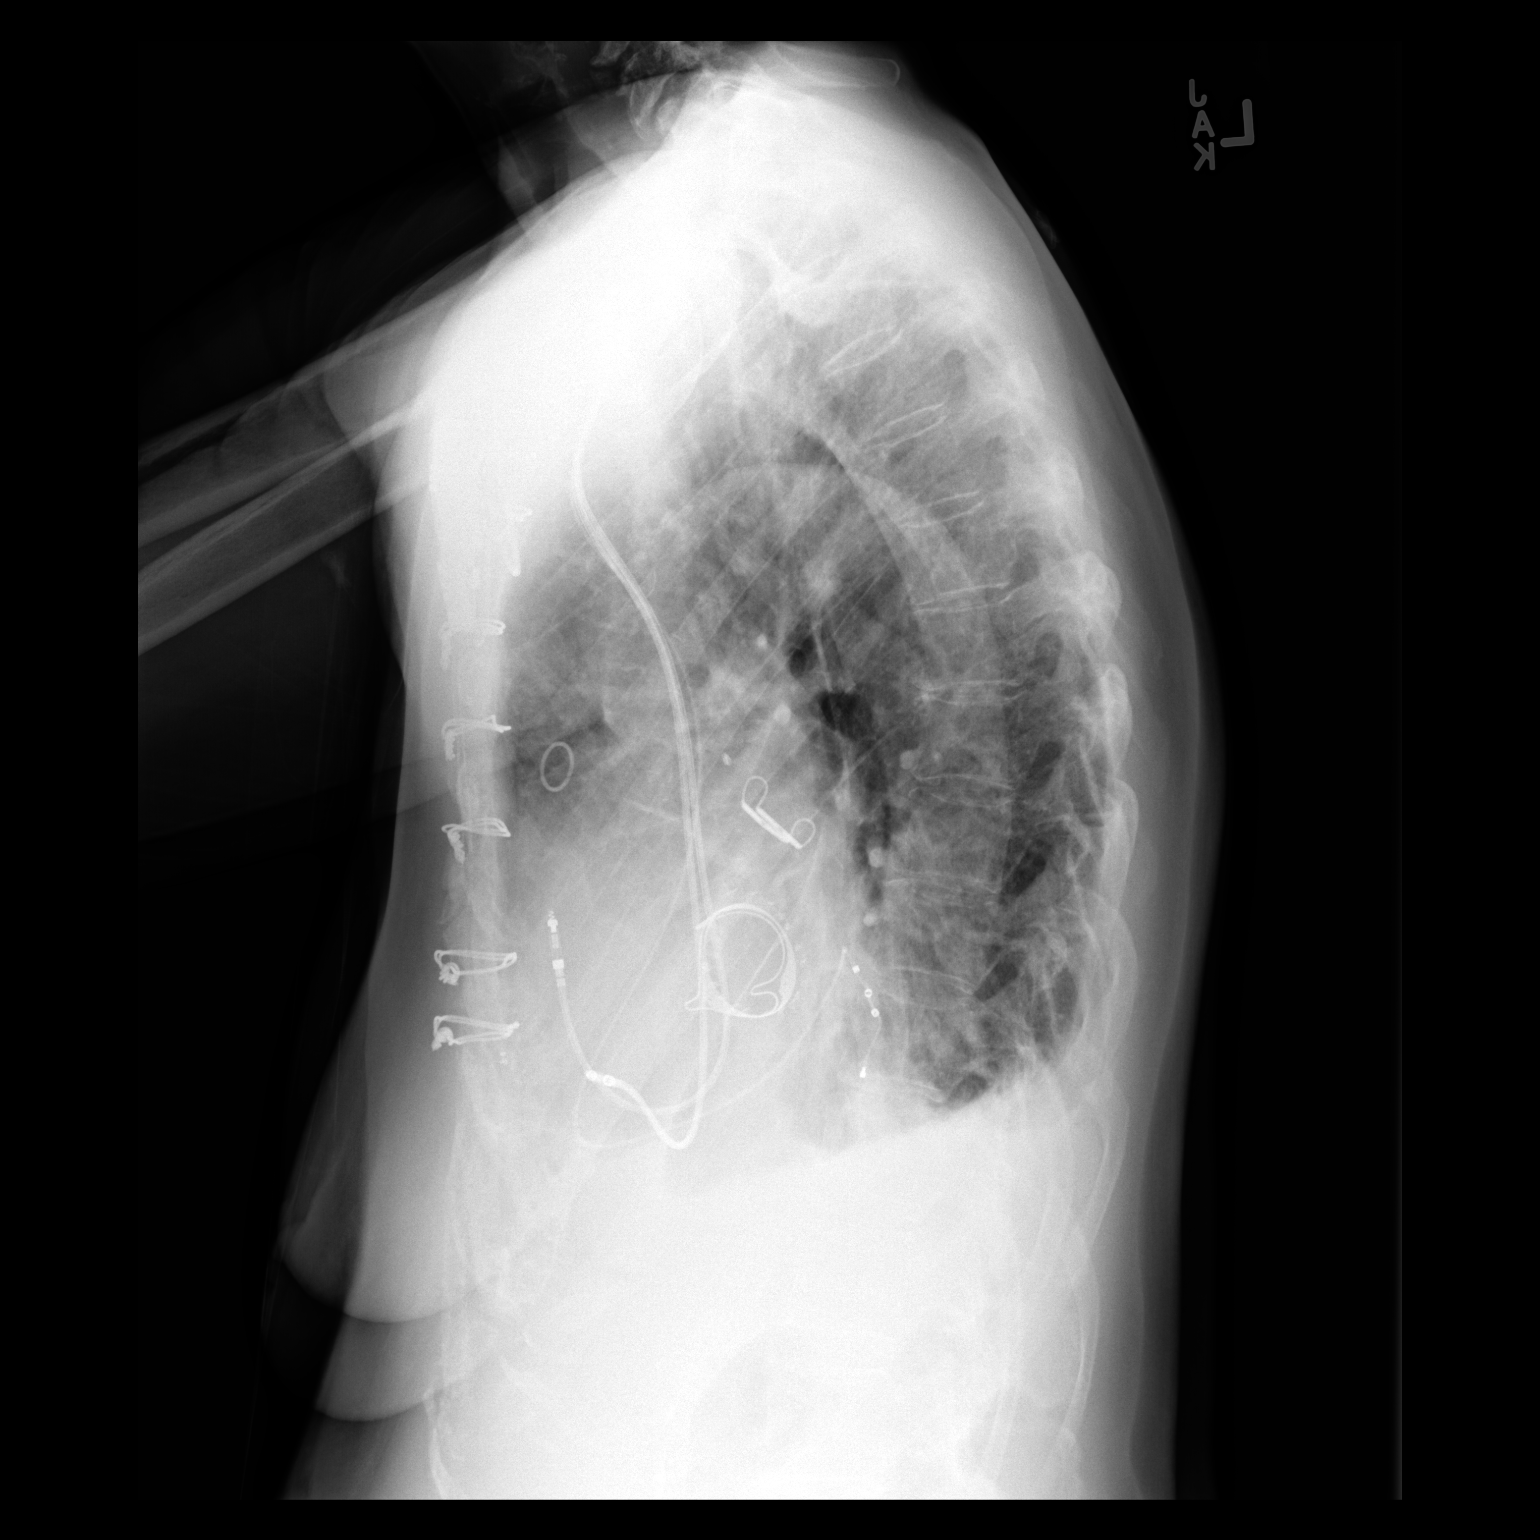

[2 of 2 positions shown; findings below may reference images not displayed]

FINDINGS: Post sternotomy changes with valve replacement. Left-sided pacing
device similar in orientation. Cardiomegaly with small pleural
effusions and bibasilar atelectasis. Aortic atherosclerosis. No
pneumothorax.
IMPRESSION: 1. Cardiomegaly
2. Small bilateral pleural effusions, new on the right, stable to
minimal decrease on the left.
3. Bibasilar atelectasis

## 2018-10-19 ENCOUNTER — Encounter: Payer: Medicare Other | Admitting: *Deleted

## 2018-10-19 ENCOUNTER — Other Ambulatory Visit: Payer: Self-pay

## 2019-02-22 ENCOUNTER — Other Ambulatory Visit: Payer: Self-pay

## 2019-02-22 DIAGNOSIS — N186 End stage renal disease: Secondary | ICD-10-CM

## 2019-02-22 DIAGNOSIS — Z992 Dependence on renal dialysis: Secondary | ICD-10-CM

## 2019-02-24 ENCOUNTER — Other Ambulatory Visit: Payer: Self-pay | Admitting: *Deleted

## 2019-02-24 ENCOUNTER — Encounter: Payer: Self-pay | Admitting: *Deleted

## 2019-02-24 ENCOUNTER — Ambulatory Visit (INDEPENDENT_AMBULATORY_CARE_PROVIDER_SITE_OTHER): Payer: Medicare Other | Admitting: Family

## 2019-02-24 ENCOUNTER — Ambulatory Visit (HOSPITAL_COMMUNITY)
Admission: RE | Admit: 2019-02-24 | Discharge: 2019-02-24 | Disposition: A | Discharge status: Home / Self Care | Payer: Medicare Other | Source: Ambulatory Visit | Attending: Family | Admitting: Family

## 2019-02-24 ENCOUNTER — Encounter: Payer: Self-pay | Admitting: Family

## 2019-02-24 ENCOUNTER — Other Ambulatory Visit: Payer: Self-pay

## 2019-02-24 VITALS — BP 180/80 | HR 68 | Temp 97.9°F | Resp 14 | Ht 69.0 in | Wt 124.0 lb

## 2019-02-24 DIAGNOSIS — T82898A Other specified complication of vascular prosthetic devices, implants and grafts, initial encounter: Secondary | ICD-10-CM | POA: Diagnosis not present

## 2019-02-24 DIAGNOSIS — N186 End stage renal disease: Secondary | ICD-10-CM

## 2019-02-24 DIAGNOSIS — Z992 Dependence on renal dialysis: Secondary | ICD-10-CM

## 2019-02-24 DIAGNOSIS — T82590A Other mechanical complication of surgically created arteriovenous fistula, initial encounter: Secondary | ICD-10-CM | POA: Diagnosis not present

## 2019-02-24 NOTE — H&P (View-Only) (Signed)
CC: Severe steal sx's left arm and left shoulder, unable to complete HD x 3 weeks, bleeding from stick sites at night  History of Present Illness  Jocelyn Hill is a 68 y.o. (1950/11/19) female who is s/p left upper arm arteriovenous graft (4 mm x 7 mm tapered Goretex graft) placement on 04-23-18 by Dr. Carlis Abbott.   She returns today at the request of Dr. Johnney Ou for evaluation of pain at her AVF. She states that pain has been present at her left upper arm AVG since placement. She also states that [facility] performed 3 balloon procedures to help this.   She is unable to complete an HD session due to pain in her left shoulder and pain in her left arm. Pt states she was told by one of her nephrologists that she needs a catheter on order to dialyze until a permanent access is working adequately.   There has been bleeding from the AVG stick sites when she is at home. Her left hand is painfully numb.   She dialyzes MWF via left upper arm AVG at [facility] in [place].  She is right hand dominant.   This is her first HD access, has been on HD since October 2019.  She takes warfarin, has a hx of atrial fib.    Past Medical History:  Diagnosis Date  . Anemia    low iron  . Anxiety   . Arthritis   . Bronchitis   . CHF (congestive heart failure) ([place])   . CKD (chronic kidney disease) stage 4, GFR 15-29 ml/min (HCC) 03/16/2017  . Coronary artery disease   . Depression   . Diabetes mellitus without complication ([place])   . Diet-controlled diabetes mellitus ([place])   . Ganglion cyst    left wrist  . GERD (gastroesophageal reflux disease)   . Headache    chronic headaches  . Heart failure ([place])   . Hyperlipidemia   . Hypertension   . Hypothyroidism   . Mitral regurgitation   . Myocardial infarction ([place])    3x last one 2008  . PAF (paroxysmal atrial fibrillation) ([place])   . Pneumonia    x 2  . S/P Maze operation for atrial fibrillation 02/18/2018   Complete bilateral atrial lesion set  using bipolar radiofrequency and cryothermy ablation with clipping of LA appendage  . S/P mitral valve replacement with bioprosthetic valve 02/18/2018   [facility] Mitral stented bovine pericardial tissue valve Model 7300 TFX Serial # [id number] Size 29  . Stroke [facility])    no lasting residual - ? 2014  . Umbilical hernia     Social History Social History   Tobacco Use  . Smoking status: Former Smoker    Types: Cigarettes  . Smokeless tobacco: Never Used  Substance Use Topics  . Alcohol use: No  . Drug use: No    Comment: marijuana in the past    Family History Family History  Problem Relation Age of Onset  . Hypertension Mother     Surgical History Past Surgical History:  Procedure Laterality Date  . ABDOMINAL HYSTERECTOMY     PARTIALS  . AV FISTULA PLACEMENT Left 04/23/2018   Procedure: LEFT ARTERIOVENOUS (AV) GRAFT CREATION;  Surgeon: Marty Heck, MD;  Location: [place];  Service: Vascular;  Laterality: Left;  . BIV PACEMAKER INSERTION CRT-P N/A 02/26/2018   Procedure: BIV PACEMAKER INSERTION CRT-P;  Surgeon: Constance Haw, MD;  Location: [place] CV LAB;  Service: Cardiovascular;  Laterality: N/A;  .  CARDIAC CATHETERIZATION    . CESAREAN SECTION    . CORONARY ARTERY BYPASS GRAFT N/A 02/18/2018   Procedure: CORONARY ARTERY BYPASS GRAFTING (CABG) WITH IMA. ENDOSCOPIC VEIN HARVEST. IMA TO LAD, SVG TO CIRC.;  Surgeon: Rexene Alberts, MD;  Location: [place];  Service: Open Heart Surgery;  Laterality: N/A;  . INSERTION OF DIALYSIS CATHETER N/A 02/18/2018   Procedure: PLACEMENT OF TEMPORARY HD CATHETER, POWER TRIALYSIS 13FR 20CM;  Surgeon: Rexene Alberts, MD;  Location: [place];  Service: Vascular;  Laterality: N/A;  . INSERTION OF DIALYSIS CATHETER Right 04/23/2018   Procedure: INSERTION OF DIALYSIS CATHETER;  Surgeon: Marty Heck, MD;  Location: [place];  Service: Vascular;  Laterality: Right;  . IR NEPHRO TUBE REMOV/FL  04/02/2017  . IR NEPHROSTOGRAM  RIGHT THRU EXISTING ACCESS  04/02/2017  . IR NEPHROSTOMY PLACEMENT RIGHT  03/27/2017  . IR THORACENTESIS ASP PLEURAL SPACE W/IMG GUIDE  03/24/2017  . MAZE N/A 02/18/2018   Procedure: MAZE;  Surgeon: Rexene Alberts, MD;  Location: [place];  Service: Open Heart Surgery;  Laterality: N/A;  . MITRAL VALVE REPLACEMENT N/A 02/18/2018   Procedure: MITRAL VALVE (MV) REPLACEMENT;  Surgeon: Rexene Alberts, MD;  Location: [place];  Service: Open Heart Surgery;  Laterality: N/A;  . MULTIPLE EXTRACTIONS WITH ALVEOLOPLASTY N/A 11/05/2017   Procedure: Extraction of tooth #'s 4,5,7,8,9,10,12,14 and 29 with alveoloplasty and gross debridement of remaining teeth;  Surgeon: [name], DDS;  Location: [place];  Service: Oral Surgery;  Laterality: N/A;  . PATENT FORAMEN OVALE(PFO) CLOSURE N/A 02/18/2018   Procedure: PATENT FORAMEN OVALE (PFO) CLOSURE;  Surgeon: Rexene Alberts, MD;  Location: [place];  Service: Open Heart Surgery;  Laterality: N/A;  . RIGHT/LEFT HEART CATH AND CORONARY ANGIOGRAPHY N/A 07/03/2017   Procedure: RIGHT/LEFT HEART CATH AND CORONARY ANGIOGRAPHY;  Surgeon: Larey Dresser, MD;  Location: [place] [facility];  Service: Cardiovascular;  Laterality: N/A;  . TEE WITHOUT CARDIOVERSION N/A 10/08/2017   Procedure: TRANSESOPHAGEAL ECHOCARDIOGRAM (TEE);  Surgeon: Larey Dresser, MD;  Location: [facility] ENDOSCOPY;  Service: Cardiovascular;  Laterality: N/A;  . TEE WITHOUT CARDIOVERSION N/A 02/18/2018   Procedure: TRANSESOPHAGEAL ECHOCARDIOGRAM (TEE);  Surgeon: Rexene Alberts, MD;  Location: [place];  Service: Open Heart Surgery;  Laterality: N/A;  . TONSILLECTOMY    . TUBAL LIGATION      Allergies  Allergen Reactions  . Ace Inhibitors Anaphylaxis and Swelling  . Motrin Ib [Ibuprofen] Anaphylaxis    Current Outpatient Medications  Medication Sig Dispense Refill  . acetaminophen (TYLENOL) 325 MG tablet Take 2 tablets (650 mg total) by mouth every 6 (six) hours as needed for mild pain, fever or headache.  30 tablet 0  . albuterol (PROVENTIL HFA;VENTOLIN HFA) 108 (90 Base) MCG/ACT inhaler Inhale 2 puffs into the lungs every 6 (six) hours as needed for wheezing or shortness of breath.    . ALPRAZolam (XANAX) 0.25 MG tablet Take 1 tablet (0.25 mg total) by mouth at bedtime as needed for anxiety. 30 tablet 0  . aspirin EC 81 MG EC tablet Take 1 tablet (81 mg total) by mouth daily. 30 tablet 0  . atorvastatin (LIPITOR) 80 MG tablet Take 1 tablet (80 mg total) by mouth daily. 30 tablet 3  . b complex vitamins tablet Take 1 tablet by mouth daily.    Marland Kitchen buPROPion (WELLBUTRIN) 100 MG tablet Take 100 mg by mouth 3 (three) times daily.    . busPIRone (BUSPAR) 15 MG tablet Take  1 tablet (15 mg total) by mouth 2 (two) times daily. 60 tablet 0  . carvedilol (COREG) 25 MG tablet Take 1 tablet (25 mg total) by mouth 2 (two) times daily.    . cholecalciferol (VITAMIN D) 1000 units tablet Take 1,000 Units by mouth daily.    . DULoxetine (CYMBALTA) 20 MG capsule Take 1 capsule (20 mg total) by mouth daily.  3  . hydrALAZINE (APRESOLINE) 50 MG tablet Take 1.5 tablets (75 mg total) by mouth every 8 (eight) hours.    Marland Kitchen ipratropium-albuterol (DUONEB) 0.5-2.5 (3) MG/3ML SOLN Take 3 mLs by nebulization every 6 (six) hours as needed.    . isosorbide mononitrate (IMDUR) 120 MG 24 hr tablet Take 1 tablet (120 mg total) by mouth daily. 30 tablet 0  . levothyroxine (SYNTHROID, LEVOTHROID) 112 MCG tablet Take 112 mcg by mouth daily before breakfast.    . montelukast (SINGULAIR) 10 MG tablet Take 1 tablet (10 mg total) by mouth daily. 30 tablet 0  . multivitamin (RENA-VIT) TABS tablet Take 1 tablet by mouth at bedtime.  0  . Nutritional Supplements (FEEDING SUPPLEMENT, NEPRO CARB STEADY,) LIQD Take 237 mLs by mouth 3 (three) times daily between meals.  0  . ondansetron (ZOFRAN) 4 MG tablet Take 4 mg by mouth every 6 (six) hours as needed for nausea or vomiting.    . pantoprazole (PROTONIX) 40 MG tablet Take 1 tablet (40 mg total)  by mouth 2 (two) times daily before a meal. 60 tablet 0  . polyethylene glycol (MIRALAX / GLYCOLAX) packet Take 17 g by mouth daily. 14 each 0  . traZODone (DESYREL) 25 mg TABS tablet Take 25 mg by mouth at bedtime.    Marland Kitchen warfarin (COUMADIN) 2.5 MG tablet Take 1 tablet (2.5 mg total) by mouth daily at 6 PM. 30 tablet 3   No current facility-administered medications for this visit.      REVIEW OF SYSTEMS: see HPI for pertinent positives and negatives    PHYSICAL EXAMINATION:  Vitals:   02/24/19 0944  BP: (!) 180/80  Pulse: 68  Resp: 14  Temp: 97.9 F (36.6 C)  TempSrc: Temporal  SpO2: 97%  Weight: 124 lb (56.2 kg)  Height: 5\' 9"  (1.753 m)   Body mass index is 18.31 kg/m.  General: Thin female  HEENT:  No gross abnormalities Pulmonary: Respirations are non-labored, adequate air movement in all fields, no rales, rhonchi, or wheezes.  Abdomen: Soft and non-tender with normal bowel sounds Musculoskeletal: There are no major deformities.   Neurologic: No focal weakness. Bilateral hand grip strength is 5/5. Left hand and arm feel different to touch than right. Skin: There are no ulcer or rashes noted. See photo below. Psychiatric: The patient has normal affect. Cardiovascular: There is a regular rate and rhythm without significant murmur appreciated.  Bilateral femoral pulses are 2-3+ palpable.  Bilateral radial pulses are faintly palpable. Left upper arm AVG with strong bruit, by palpation thrill is not present, pulse is palpable.    Left upper arm AVG, blood oozing from stick sties   Non-Invasive Vascular Imaging  Left Upper Arm Access Duplex  (Date: 02/24/2019):  +--------------------+----------+-----------------+--------+ AVG                 PSV (cm/s)Flow Vol (mL/min)Describe +--------------------+----------+-----------------+--------+ Native artery inflow   347                              +--------------------+----------+-----------------+--------+  Arterial  anastomosis   672                              +--------------------+----------+-----------------+--------+ Prox graft             454                              +--------------------+----------+-----------------+--------+ Mid graft              255                              +--------------------+----------+-----------------+--------+ Distal graft           258                              +--------------------+----------+-----------------+--------+ Venous anastomosis     652                     stenotic +--------------------+----------+-----------------+--------+ Venous outflow          60                              +--------------------+----------+-----------------+--------+ Velocities in the proximal left subclavian vein increases from 60 cm/sec to 623 cm/sec at and area where the pacemaker leads are consistent with a outflow vein stenosis. A large superficial vein crosses superficial to the [place] at the distal upper arm and antecubital fossa.  Summary: Arteriovenous graft-Stenosis noted in the venous anastomosis.  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   Medical Decision Making  SHARETHA SANTARELLI is a 68 y.o. female who is s/p left upper arm arteriovenous graft (4 mm x 7 mm tapered Goretex graft) placement on 04-23-18 by Dr. Carlis Abbott.    Dr. Scot Dock spoke with and examined pt.  Pt is having bleeding at home at the AVG stick sites. She has severe steal sx's in her left upper extremity. She is unable to complete a dialysis session for the last 3 weeks due to severe pain in her left shoulder and severe steal sx's in her left arm. She has a pacemaker in her left upper chest. She takes warfarin.  [facility] has attempted to address the above difficulties with 3 separate procedures, the last of which was about a month ago.  For the above reasons, Dr. Scot Dock recommends ligating the left upper arm AVG to relieve the  steal sx's, and placement of a right side TDC for dialysis. After the pt has recovered from this, and is dialyzing well, will reconsider options for permanent HD access.   Clemon Chambers, RN, MSN, FNP-C [facility] [place] Office: [phone]  02/24/2019, 9:54 AM  Clinic MD: Laqueta Due

## 2019-02-24 NOTE — Progress Notes (Signed)
CC: Severe steal sx's left arm and left shoulder, unable to complete HD x 3 weeks, bleeding from stick sites at night  History of Present Illness  Jocelyn Hill is a 68 y.o. (Nov 13, 1950) female who is s/p left upper arm arteriovenous graft (4 mm x 7 mm tapered Goretex graft) placement on 04-23-18 by Dr. Carlis Abbott.   She returns today at the request of Dr. Johnney Ou for evaluation of pain at her AVF. She states that pain has been present at her left upper arm AVG since placement. She also states that [facility] performed 3 balloon procedures to help this.   She is unable to complete an HD session due to pain in her left shoulder and pain in her left arm. Pt states she was told by one of her nephrologists that she needs a catheter on order to dialyze until a permanent access is working adequately.   There has been bleeding from the AVG stick sites when she is at home. Her left hand is painfully numb.   She dialyzes MWF via left upper arm AVG at [facility] in [place].  She is right hand dominant.   This is her first HD access, has been on HD since October 2019.  She takes warfarin, has a hx of atrial fib.    Past Medical History:  Diagnosis Date  . Anemia    low iron  . Anxiety   . Arthritis   . Bronchitis   . CHF (congestive heart failure) ([place])   . CKD (chronic kidney disease) stage 4, GFR 15-29 ml/min (HCC) 03/16/2017  . Coronary artery disease   . Depression   . Diabetes mellitus without complication ([place])   . Diet-controlled diabetes mellitus ([place])   . Ganglion cyst    left wrist  . GERD (gastroesophageal reflux disease)   . Headache    chronic headaches  . Heart failure ([place])   . Hyperlipidemia   . Hypertension   . Hypothyroidism   . Mitral regurgitation   . Myocardial infarction ([place])    3x last one 2008  . PAF (paroxysmal atrial fibrillation) ([place])   . Pneumonia    x 2  . S/P Maze operation for atrial fibrillation 02/18/2018   Complete bilateral atrial lesion set  using bipolar radiofrequency and cryothermy ablation with clipping of LA appendage  . S/P mitral valve replacement with bioprosthetic valve 02/18/2018   [facility] Mitral stented bovine pericardial tissue valve Model 7300 TFX Serial # [id number] Size 29  . Stroke [facility])    no lasting residual - ? 2014  . Umbilical hernia     Social History Social History   Tobacco Use  . Smoking status: Former Smoker    Types: Cigarettes  . Smokeless tobacco: Never Used  Substance Use Topics  . Alcohol use: No  . Drug use: No    Comment: marijuana in the past    Family History Family History  Problem Relation Age of Onset  . Hypertension Mother     Surgical History Past Surgical History:  Procedure Laterality Date  . ABDOMINAL HYSTERECTOMY     PARTIALS  . AV FISTULA PLACEMENT Left 04/23/2018   Procedure: LEFT ARTERIOVENOUS (AV) GRAFT CREATION;  Surgeon: Marty Heck, MD;  Location: [place];  Service: Vascular;  Laterality: Left;  . BIV PACEMAKER INSERTION CRT-P N/A 02/26/2018   Procedure: BIV PACEMAKER INSERTION CRT-P;  Surgeon: Constance Haw, MD;  Location: [place] CV LAB;  Service: Cardiovascular;  Laterality: N/A;  .  CARDIAC CATHETERIZATION    . CESAREAN SECTION    . CORONARY ARTERY BYPASS GRAFT N/A 02/18/2018   Procedure: CORONARY ARTERY BYPASS GRAFTING (CABG) WITH IMA. ENDOSCOPIC VEIN HARVEST. IMA TO LAD, SVG TO CIRC.;  Surgeon: Rexene Alberts, MD;  Location: [place];  Service: Open Heart Surgery;  Laterality: N/A;  . INSERTION OF DIALYSIS CATHETER N/A 02/18/2018   Procedure: PLACEMENT OF TEMPORARY HD CATHETER, POWER TRIALYSIS 13FR 20CM;  Surgeon: Rexene Alberts, MD;  Location: [place];  Service: Vascular;  Laterality: N/A;  . INSERTION OF DIALYSIS CATHETER Right 04/23/2018   Procedure: INSERTION OF DIALYSIS CATHETER;  Surgeon: Marty Heck, MD;  Location: [place];  Service: Vascular;  Laterality: Right;  . IR NEPHRO TUBE REMOV/FL  04/02/2017  . IR NEPHROSTOGRAM  RIGHT THRU EXISTING ACCESS  04/02/2017  . IR NEPHROSTOMY PLACEMENT RIGHT  03/27/2017  . IR THORACENTESIS ASP PLEURAL SPACE W/IMG GUIDE  03/24/2017  . MAZE N/A 02/18/2018   Procedure: MAZE;  Surgeon: Rexene Alberts, MD;  Location: [place];  Service: Open Heart Surgery;  Laterality: N/A;  . MITRAL VALVE REPLACEMENT N/A 02/18/2018   Procedure: MITRAL VALVE (MV) REPLACEMENT;  Surgeon: Rexene Alberts, MD;  Location: [place];  Service: Open Heart Surgery;  Laterality: N/A;  . MULTIPLE EXTRACTIONS WITH ALVEOLOPLASTY N/A 11/05/2017   Procedure: Extraction of tooth #'s 4,5,7,8,9,10,12,14 and 29 with alveoloplasty and gross debridement of remaining teeth;  Surgeon: [name], DDS;  Location: [place];  Service: Oral Surgery;  Laterality: N/A;  . PATENT FORAMEN OVALE(PFO) CLOSURE N/A 02/18/2018   Procedure: PATENT FORAMEN OVALE (PFO) CLOSURE;  Surgeon: Rexene Alberts, MD;  Location: [place];  Service: Open Heart Surgery;  Laterality: N/A;  . RIGHT/LEFT HEART CATH AND CORONARY ANGIOGRAPHY N/A 07/03/2017   Procedure: RIGHT/LEFT HEART CATH AND CORONARY ANGIOGRAPHY;  Surgeon: Larey Dresser, MD;  Location: [place] CV LAB;  Service: Cardiovascular;  Laterality: N/A;  . TEE WITHOUT CARDIOVERSION N/A 10/08/2017   Procedure: TRANSESOPHAGEAL ECHOCARDIOGRAM (TEE);  Surgeon: Larey Dresser, MD;  Location: [facility] ENDOSCOPY;  Service: Cardiovascular;  Laterality: N/A;  . TEE WITHOUT CARDIOVERSION N/A 02/18/2018   Procedure: TRANSESOPHAGEAL ECHOCARDIOGRAM (TEE);  Surgeon: Rexene Alberts, MD;  Location: [place];  Service: Open Heart Surgery;  Laterality: N/A;  . TONSILLECTOMY    . TUBAL LIGATION      Allergies  Allergen Reactions  . Ace Inhibitors Anaphylaxis and Swelling  . Motrin Ib [Ibuprofen] Anaphylaxis    Current Outpatient Medications  Medication Sig Dispense Refill  . acetaminophen (TYLENOL) 325 MG tablet Take 2 tablets (650 mg total) by mouth every 6 (six) hours as needed for mild pain, fever or headache.  30 tablet 0  . albuterol (PROVENTIL HFA;VENTOLIN HFA) 108 (90 Base) MCG/ACT inhaler Inhale 2 puffs into the lungs every 6 (six) hours as needed for wheezing or shortness of breath.    . ALPRAZolam (XANAX) 0.25 MG tablet Take 1 tablet (0.25 mg total) by mouth at bedtime as needed for anxiety. 30 tablet 0  . aspirin EC 81 MG EC tablet Take 1 tablet (81 mg total) by mouth daily. 30 tablet 0  . atorvastatin (LIPITOR) 80 MG tablet Take 1 tablet (80 mg total) by mouth daily. 30 tablet 3  . b complex vitamins tablet Take 1 tablet by mouth daily.    Marland Kitchen buPROPion (WELLBUTRIN) 100 MG tablet Take 100 mg by mouth 3 (three) times daily.    . busPIRone (BUSPAR) 15 MG tablet Take  1 tablet (15 mg total) by mouth 2 (two) times daily. 60 tablet 0  . carvedilol (COREG) 25 MG tablet Take 1 tablet (25 mg total) by mouth 2 (two) times daily.    . cholecalciferol (VITAMIN D) 1000 units tablet Take 1,000 Units by mouth daily.    . DULoxetine (CYMBALTA) 20 MG capsule Take 1 capsule (20 mg total) by mouth daily.  3  . hydrALAZINE (APRESOLINE) 50 MG tablet Take 1.5 tablets (75 mg total) by mouth every 8 (eight) hours.    Marland Kitchen ipratropium-albuterol (DUONEB) 0.5-2.5 (3) MG/3ML SOLN Take 3 mLs by nebulization every 6 (six) hours as needed.    . isosorbide mononitrate (IMDUR) 120 MG 24 hr tablet Take 1 tablet (120 mg total) by mouth daily. 30 tablet 0  . levothyroxine (SYNTHROID, LEVOTHROID) 112 MCG tablet Take 112 mcg by mouth daily before breakfast.    . montelukast (SINGULAIR) 10 MG tablet Take 1 tablet (10 mg total) by mouth daily. 30 tablet 0  . multivitamin (RENA-VIT) TABS tablet Take 1 tablet by mouth at bedtime.  0  . Nutritional Supplements (FEEDING SUPPLEMENT, NEPRO CARB STEADY,) LIQD Take 237 mLs by mouth 3 (three) times daily between meals.  0  . ondansetron (ZOFRAN) 4 MG tablet Take 4 mg by mouth every 6 (six) hours as needed for nausea or vomiting.    . pantoprazole (PROTONIX) 40 MG tablet Take 1 tablet (40 mg total)  by mouth 2 (two) times daily before a meal. 60 tablet 0  . polyethylene glycol (MIRALAX / GLYCOLAX) packet Take 17 g by mouth daily. 14 each 0  . traZODone (DESYREL) 25 mg TABS tablet Take 25 mg by mouth at bedtime.    Marland Kitchen warfarin (COUMADIN) 2.5 MG tablet Take 1 tablet (2.5 mg total) by mouth daily at 6 PM. 30 tablet 3   No current facility-administered medications for this visit.      REVIEW OF SYSTEMS: see HPI for pertinent positives and negatives    PHYSICAL EXAMINATION:  Vitals:   02/24/19 0944  BP: (!) 180/80  Pulse: 68  Resp: 14  Temp: 97.9 F (36.6 C)  TempSrc: Temporal  SpO2: 97%  Weight: 124 lb (56.2 kg)  Height: 5\' 9"  (1.753 m)   Body mass index is 18.31 kg/m.  General: Thin female  HEENT:  No gross abnormalities Pulmonary: Respirations are non-labored, adequate air movement in all fields, no rales, rhonchi, or wheezes.  Abdomen: Soft and non-tender with normal bowel sounds Musculoskeletal: There are no major deformities.   Neurologic: No focal weakness. Bilateral hand grip strength is 5/5. Left hand and arm feel different to touch than right. Skin: There are no ulcer or rashes noted. See photo below. Psychiatric: The patient has normal affect. Cardiovascular: There is a regular rate and rhythm without significant murmur appreciated.  Bilateral femoral pulses are 2-3+ palpable.  Bilateral radial pulses are faintly palpable. Left upper arm AVG with strong bruit, by palpation thrill is not present, pulse is palpable.    Left upper arm AVG, blood oozing from stick sties   Non-Invasive Vascular Imaging  Left Upper Arm Access Duplex  (Date: 02/24/2019):  +--------------------+----------+-----------------+--------+ AVG                 PSV (cm/s)Flow Vol (mL/min)Describe +--------------------+----------+-----------------+--------+ Native artery inflow   347                              +--------------------+----------+-----------------+--------+  Arterial  anastomosis   672                              +--------------------+----------+-----------------+--------+ Prox graft             454                              +--------------------+----------+-----------------+--------+ Mid graft              255                              +--------------------+----------+-----------------+--------+ Distal graft           258                              +--------------------+----------+-----------------+--------+ Venous anastomosis     652                     stenotic +--------------------+----------+-----------------+--------+ Venous outflow          60                              +--------------------+----------+-----------------+--------+ Velocities in the proximal left subclavian vein increases from 60 cm/sec to 623 cm/sec at and area where the pacemaker leads are consistent with a outflow vein stenosis. A large superficial vein crosses superficial to the [place] at the distal upper arm and antecubital fossa.  Summary: Arteriovenous graft-Stenosis noted in the venous anastomosis.  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   Medical Decision Making  Jocelyn Hill is a 68 y.o. female who is s/p left upper arm arteriovenous graft (4 mm x 7 mm tapered Goretex graft) placement on 04-23-18 by Dr. Carlis Abbott.    Dr. Scot Dock spoke with and examined pt.  Pt is having bleeding at home at the AVG stick sites. She has severe steal sx's in her left upper extremity. She is unable to complete a dialysis session for the last 3 weeks due to severe pain in her left shoulder and severe steal sx's in her left arm. She has a pacemaker in her left upper chest. She takes warfarin.  [facility] has attempted to address the above difficulties with 3 separate procedures, the last of which was about a month ago.  For the above reasons, Dr. Scot Dock recommends ligating the left upper arm AVG to relieve the  steal sx's, and placement of a right side TDC for dialysis. After the pt has recovered from this, and is dialyzing well, will reconsider options for permanent HD access.   Clemon Chambers, RN, MSN, FNP-C [facility] [place] Office: [phone]  02/24/2019, 9:54 AM  Clinic MD: Laqueta Due

## 2019-02-28 ENCOUNTER — Encounter (HOSPITAL_COMMUNITY): Payer: Self-pay | Admitting: *Deleted

## 2019-02-28 ENCOUNTER — Other Ambulatory Visit: Payer: Self-pay

## 2019-02-28 NOTE — Anesthesia Preprocedure Evaluation (Addendum)
Anesthesia Evaluation  Patient identified by MRN, date of birth, ID band Patient awake    Reviewed: Allergy & Precautions, NPO status , Patient's Chart, lab work & pertinent test results  Airway Mallampati: II  TM Distance: >3 FB Neck ROM: Full    Dental  (+) Teeth Intact, Poor Dentition, Dental Advisory Given   Pulmonary former smoker,    breath sounds clear to auscultation       Cardiovascular hypertension, + CAD, + Past MI and +CHF  + dysrhythmias Atrial Fibrillation + pacemaker + Valvular Problems/Murmurs MR  Rhythm:Regular Rate:Normal  EF 30-35%   Neuro/Psych  Headaches, Anxiety Depression CVA    GI/Hepatic GERD  ,  Endo/Other  diabetesHypothyroidism   Renal/GU Dialysis and ESRFRenal disease     Musculoskeletal  (+) Arthritis ,   Abdominal Normal abdominal exam  (+)   Peds  Hematology  (+) anemia ,   Anesthesia Other Findings   Reproductive/Obstetrics                          Anesthesia Physical Anesthesia Plan  ASA: IV  Anesthesia Plan: MAC   Post-op Pain Management:    Induction:   PONV Risk Score and Plan: Treatment may vary due to age or medical condition and Ondansetron  Airway Management Planned: Nasal Cannula and Natural Airway  Additional Equipment: None  Intra-op Plan:   Post-operative Plan:   Informed Consent: I have reviewed the patients History and Physical, chart, labs and discussed the procedure including the risks, benefits and alternatives for the proposed anesthesia with the patient or authorized representative who has indicated his/her understanding and acceptance.     Dental advisory given  Plan Discussed with: CRNA  Anesthesia Plan Comments: (PAT note written 02/28/2019 by Myra Gianotti, PA-C. )     Anesthesia Quick Evaluation

## 2019-02-28 NOTE — Progress Notes (Signed)
Device order returned. Per device order patient needs device check and request industry check prior to procedure. Windle Guard made aware.

## 2019-02-28 NOTE — Progress Notes (Signed)
Anesthesia Chart Review: SAME DAY WORK-UP   Case: [id number] Date/Time: 03/01/19 0942   Procedures:      LIGATION ARTERIOVENOUS GORTEX GRAFT (Left )     INSERTION OF DIALYSIS CATHETER RIGHT CHEST (Right )   Anesthesia type: Choice   Pre-op diagnosis: STEAL SYNDROME LEFT HAND   [facility]: [facility] ROOM 10 / [facility] OR   Surgeon: Elam Dutch, MD      DISCUSSION: Patient is a 68 year old female scheduled for the above procedure. She is s/p LUE AVGG 04/23/18, but is having left hand numbness and severe LUE pain with hemodialysis (to the point she is having to end sessions early). She also reported bleeding at night from the HD access sites. She is on warfarin for afib. Ligation of AVGG recommended.   History includes former smoker, ESRD (HD MWF, [place]), CAD/severe MR/PFO/PAF (MI x2 prior to 2019 reported; s/p MV replacement 29 mm Bovine bioprosthetic valve; MAZE procedure with clipping of LA appendage, CABG x2: LIMA-LAD, SVG-distal LCx, closure of PFO 02/18/18), complete heart block (s/p St[employer] Jude BiV PPM 02/26/18), combined chronic CHF, HTN, HLD, DM2 (diet controlled), CVA (~ 2014), anemia, hypothyroidism.  Per [facility], hold warfarin 3 days prior to surgery.  She is a same day work-up, so she will get labs and anesthesia team evaluation on the day of surgery. Perioperative PPM Rx form is still pending from [facility], but RN did notify [employer] Jude rep of surgery plans. She has a Event organiser [phone] [employer] PACEMAKER.   PROVIDERS: [name], MD is listed as PCP ([place]) [facility] provides nephrology care [name], MD is HF cardiologist. Last seen by [name], NP on 05/05/18.  [name], MD is EP cardiologist [name], MD is CT surgeon   LABS: For day of surgery. As of 04/30/18, H/H 9.5/29.8.   PFTs 12/21/17: FVC 2.50 (81%), FEV1 1.76 (73%), DLCO unc 16.62 (53%), cor 17.48 (56%).   IMAGES: 1V CXR 04/23/18 (during CHF  admission): IMPRESSION: 1.  Dual-lumen right IJ catheter with tip at cavoatrial junction. 2. AICD in stable position. Prior CABG and cardiac valve replacement. Cardiomegaly with bilateral pulmonary interstitial prominence and left-sided pleural effusion consistent with CHF noted on today's exam.   EKG: 04/29/18: Normal sinus rhythm Left axis deviation Non-specific intra-ventricular conduction block Cannot rule out Anteroseptal infarct , age undetermined Abnormal ECG No significant change since last tracing Confirmed by Cristopher Peru [id number]) on 05/05/2018 2:20:57 PM   CV: Echo 04/18/18:  Study Conclusions - Left ventricle: The cavity size was normal. There was severe   concentric hypertrophy. Systolic function was moderately to   severely reduced. The estimated ejection fraction was in the   range of 30% to 35%. Features are consistent with a pseudonormal   left ventricular filling pattern, with concomitant abnormal   relaxation and increased filling pressure (grade 2 diastolic   dysfunction). Doppler parameters are consistent with elevated   ventricular end-diastolic filling pressure. - Mitral valve: S/P MVR with a bioprosthetic valve. Mean gradient   (D): 5 mm Hg. Valve area by continuity equation (using LVOT   flow): 1.06 cm^2. - Right ventricle: Systolic function was normal. - Right atrium: The atrium was normal in size. - Pulmonic valve: There was mild regurgitation. - Pulmonary arteries: Systolic pressure was within the normal   range. - Pericardium, extracardiac: A trivial pericardial effusion was   identified posterior to the heart. Features were not consistent   with tamponade physiology. There was a  large left pleural   effusion. Impressions: - LVEF is 30-35% with diffuse hypokinesis and significant   paradoxical septal motion and interventricular dyssynchrony.   LVEDP is severely elevated with E/E&' =32. LVEF is unchanged from   the prior echocardiogram from  02/22/2018.  Carotid US 12/21/17: Final Interpretation: - Right Carotid: Velocities in the right ICA are consistent with a 1-39% stenosis. - Left Carotid: Velocities in the left ICA are consistent with a 1-39% stenosis. - Vertebrals:  Bilateral vertebral arteries demonstrate antegrade flow. - Subclavians: Normal flow hemodynamics were seen in bilateral subclavian arteries.   Cardiac cath 07/03/17 (pre-MVR/CABG):  Conclusion: 1. Low filling pressure and preserved cardiac output.  2. 90% ostial LCx stenosis.  3. Napkin ring-like stenosis in the proximal LAD, at least 60% stenosis  - Patient will have MV surgery.  She will need CABG with grafting of LCx +/- LAD.     Past Medical History:  Diagnosis Date  . Anemia    low iron  . Anxiety   . Arthritis   . Bronchitis   . CHF (congestive heart failure) ([place])   . CKD (chronic kidney disease) stage 4, GFR 15-29 ml/min (HCC) 03/16/2017  . Coronary artery disease   . Depression   . Diabetes mellitus without complication ([place])   . Diet-controlled diabetes mellitus ([place])   . Ganglion cyst    left wrist  . GERD (gastroesophageal reflux disease)   . Headache    chronic headaches  . Heart failure ([place])   . Hyperlipidemia   . Hypertension   . Hypothyroidism   . Mitral regurgitation   . Myocardial infarction ([place])    3x last one 2008  . PAF (paroxysmal atrial fibrillation) ([place])   . Pneumonia    x 2  . S/P Maze operation for atrial fibrillation 02/18/2018   Complete bilateral atrial lesion set using bipolar radiofrequency and cryothermy ablation with clipping of LA appendage  . S/P mitral valve replacement with bioprosthetic valve 02/18/2018   [facility] Mitral stented bovine pericardial tissue valve Model 7300 TFX Serial # [id number] Size 29  . Stroke [facility])    no lasting residual - ? 2014  . Umbilical hernia     Past Surgical History:  Procedure Laterality Date  . ABDOMINAL HYSTERECTOMY     PARTIALS  . AV FISTULA PLACEMENT Left  04/23/2018   Procedure: LEFT ARTERIOVENOUS (AV) GRAFT CREATION;  Surgeon: Marty Heck, MD;  Location: [place];  Service: Vascular;  Laterality: Left;  . BIV PACEMAKER INSERTION CRT-P N/A 02/26/2018   Procedure: BIV PACEMAKER INSERTION CRT-P;  Surgeon: Constance Haw, MD;  Location: [place] CV LAB;  Service: Cardiovascular;  Laterality: N/A;  . CARDIAC CATHETERIZATION    . CESAREAN SECTION    . CORONARY ARTERY BYPASS GRAFT N/A 02/18/2018   Procedure: CORONARY ARTERY BYPASS GRAFTING (CABG) WITH IMA. ENDOSCOPIC VEIN HARVEST. IMA TO LAD, SVG TO CIRC.;  Surgeon: Rexene Alberts, MD;  Location: [place];  Service: Open Heart Surgery;  Laterality: N/A;  . INSERTION OF DIALYSIS CATHETER N/A 02/18/2018   Procedure: PLACEMENT OF TEMPORARY HD CATHETER, POWER TRIALYSIS 13FR 20CM;  Surgeon: Rexene Alberts, MD;  Location: [place];  Service: Vascular;  Laterality: N/A;  . INSERTION OF DIALYSIS CATHETER Right 04/23/2018   Procedure: INSERTION OF DIALYSIS CATHETER;  Surgeon: Marty Heck, MD;  Location: [name];  Service: Vascular;  Laterality: Right;  . IR NEPHRO TUBE REMOV/FL  04/02/2017  . IR NEPHROSTOGRAM RIGHT THRU EXISTING ACCESS  04/02/2017  . IR NEPHROSTOMY PLACEMENT RIGHT  03/27/2017  . IR THORACENTESIS ASP PLEURAL SPACE W/IMG GUIDE  03/24/2017  . MAZE N/A 02/18/2018   Procedure: MAZE;  Surgeon: Rexene Alberts, MD;  Location: [place];  Service: Open Heart Surgery;  Laterality: N/A;  . MITRAL VALVE REPLACEMENT N/A 02/18/2018   Procedure: MITRAL VALVE (MV) REPLACEMENT;  Surgeon: Rexene Alberts, MD;  Location: [place];  Service: Open Heart Surgery;  Laterality: N/A;  . MULTIPLE EXTRACTIONS WITH ALVEOLOPLASTY N/A 11/05/2017   Procedure: Extraction of tooth #'s 4,5,7,8,9,10,12,14 and 29 with alveoloplasty and gross debridement of remaining teeth;  Surgeon: [name], DDS;  Location: [place];  Service: Oral Surgery;  Laterality: N/A;  . PATENT FORAMEN OVALE(PFO) CLOSURE N/A 02/18/2018    Procedure: PATENT FORAMEN OVALE (PFO) CLOSURE;  Surgeon: Rexene Alberts, MD;  Location: [place];  Service: Open Heart Surgery;  Laterality: N/A;  . RIGHT/LEFT HEART CATH AND CORONARY ANGIOGRAPHY N/A 07/03/2017   Procedure: RIGHT/LEFT HEART CATH AND CORONARY ANGIOGRAPHY;  Surgeon: Larey Dresser, MD;  Location: [place] CV LAB;  Service: Cardiovascular;  Laterality: N/A;  . TEE WITHOUT CARDIOVERSION N/A 10/08/2017   Procedure: TRANSESOPHAGEAL ECHOCARDIOGRAM (TEE);  Surgeon: Larey Dresser, MD;  Location: [facility] ENDOSCOPY;  Service: Cardiovascular;  Laterality: N/A;  . TEE WITHOUT CARDIOVERSION N/A 02/18/2018   Procedure: TRANSESOPHAGEAL ECHOCARDIOGRAM (TEE);  Surgeon: Rexene Alberts, MD;  Location: [place];  Service: Open Heart Surgery;  Laterality: N/A;  . TONSILLECTOMY    . TUBAL LIGATION      MEDICATIONS: No current facility-administered medications for this encounter.    Marland Kitchen acetaminophen (TYLENOL) 500 MG tablet  . albuterol (PROVENTIL HFA;VENTOLIN HFA) 108 (90 Base) MCG/ACT inhaler  . ALPRAZolam (XANAX) 0.25 MG tablet  . amLODipine (NORVASC) 10 MG tablet  . aspirin EC 81 MG EC tablet  . atorvastatin (LIPITOR) 80 MG tablet  . buPROPion (WELLBUTRIN) 100 MG tablet  . busPIRone (BUSPAR) 15 MG tablet  . carvedilol (COREG) 25 MG tablet  . cholecalciferol (VITAMIN D) 1000 units tablet  . Cyanocobalamin (B-12) 2500 MCG TABS  . diphenhydrAMINE (BENADRYL) 25 MG tablet  . doxazosin (CARDURA) 4 MG tablet  . doxazosin (CARDURA) 8 MG tablet  . DULoxetine (CYMBALTA) 30 MG capsule  . gabapentin (NEURONTIN) 300 MG capsule  . hydrALAZINE (APRESOLINE) 100 MG tablet  . isosorbide mononitrate (IMDUR) 120 MG 24 hr tablet  . levothyroxine (SYNTHROID) 150 MCG tablet  . lidocaine-prilocaine (EMLA) cream  . montelukast (SINGULAIR) 10 MG tablet  . Multiple Vitamin (MULTIVITAMIN WITH MINERALS) TABS tablet  . multivitamin (RENA-VIT) TABS tablet  . Naphazoline HCl (CLEAR EYES OP)  . nitroGLYCERIN  (NITROSTAT) 0.4 MG SL tablet  . Nutritional Supplements (FEEDING SUPPLEMENT, NEPRO CARB STEADY,) LIQD  . ondansetron (ZOFRAN) 4 MG tablet  . pantoprazole (PROTONIX) 40 MG tablet  . polyethylene glycol (MIRALAX / GLYCOLAX) packet  . tiZANidine (ZANAFLEX) 4 MG tablet  . traZODone (DESYREL) 100 MG tablet  . warfarin (COUMADIN) 3 MG tablet    Myra Gianotti, PA-C Surgical Short Stay/Anesthesiology [facility] Phone [phone] [facility] Phone [phone] 02/28/2019 1:15 PM

## 2019-02-28 NOTE — Progress Notes (Signed)
Ms Jocelyn Hill denies shortness of breath at this time.  Patient reports that she has chest pain at times, mostly while on dialysis- she had the pain this am, while on dialysis.  Ms Jocelyn Hill states that it was left side chest pain, close to pacemaker, but not pacemaker site; pain lasted about 30 minutes, a dull pain. Ms Jocelyn Hill states that she drank some soda and belched and it went away. Ms Jocelyn Hill states that sometimes she has lightheadedness, shortness of breath and diaphoresis with chest [pain, but did not have any of these symptoms this am.  Ms Jocelyn Hill has type II diabetes, she is diet controlled.  Ms Jocelyn Hill states that she does not check CBG, but has a CBG machine.  I instructed patient to check CBG after awaking and every 2 hours until arrival  to the hospital.  I Instructed patient if CBG is less than 70 to drink  1/2 cup of a clear juice. Recheck CBG in 15 minutes then call pre- op desk at [phone] for further instructions.  Ms Jocelyn Hill will arrive  at 7:00 am to be tested for Covid.  I spoke to Dr Viona Gilmore. Jocelyn Hill and informed him of the chest pain, he said they will evaluate in am.

## 2019-02-28 NOTE — Progress Notes (Addendum)
Addendum in documentation from 02/28/2019. I documented incorrectly that patient patient has ICD. It appears from documentation received from device clinic that patient actually has a pacemaker. Patient has [employer] Jude pacemaker spoke with Aaron Edelman with [facility] and he is aware that patient is coming in for surgery. Phone number on front of chart.

## 2019-03-01 ENCOUNTER — Ambulatory Visit (HOSPITAL_COMMUNITY): Payer: Medicare Other

## 2019-03-01 ENCOUNTER — Encounter (HOSPITAL_COMMUNITY): Payer: Self-pay

## 2019-03-01 ENCOUNTER — Ambulatory Visit (HOSPITAL_COMMUNITY): Payer: Medicare Other | Admitting: Vascular Surgery

## 2019-03-01 ENCOUNTER — Encounter (HOSPITAL_COMMUNITY)
Admission: RE | Disposition: A | Discharge status: Home / Self Care | Payer: Self-pay | Source: Home / Self Care | Attending: Vascular Surgery

## 2019-03-01 ENCOUNTER — Ambulatory Visit (HOSPITAL_COMMUNITY)
Admission: RE | Admit: 2019-03-01 | Discharge: 2019-03-01 | Disposition: A | Discharge status: Home / Self Care | Payer: Medicare Other | Attending: Vascular Surgery | Admitting: Vascular Surgery

## 2019-03-01 DIAGNOSIS — Z452 Encounter for adjustment and management of vascular access device: Secondary | ICD-10-CM

## 2019-03-01 DIAGNOSIS — M79602 Pain in left arm: Secondary | ICD-10-CM | POA: Insufficient documentation

## 2019-03-01 DIAGNOSIS — Z992 Dependence on renal dialysis: Secondary | ICD-10-CM | POA: Diagnosis not present

## 2019-03-01 DIAGNOSIS — Z886 Allergy status to analgesic agent status: Secondary | ICD-10-CM | POA: Insufficient documentation

## 2019-03-01 DIAGNOSIS — N186 End stage renal disease: Secondary | ICD-10-CM | POA: Insufficient documentation

## 2019-03-01 DIAGNOSIS — M79622 Pain in left upper arm: Secondary | ICD-10-CM | POA: Insufficient documentation

## 2019-03-01 DIAGNOSIS — M25512 Pain in left shoulder: Secondary | ICD-10-CM | POA: Insufficient documentation

## 2019-03-01 DIAGNOSIS — I252 Old myocardial infarction: Secondary | ICD-10-CM | POA: Diagnosis not present

## 2019-03-01 DIAGNOSIS — I130 Hypertensive heart and chronic kidney disease with heart failure and stage 1 through stage 4 chronic kidney disease, or unspecified chronic kidney disease: Secondary | ICD-10-CM | POA: Insufficient documentation

## 2019-03-01 DIAGNOSIS — Z952 Presence of prosthetic heart valve: Secondary | ICD-10-CM | POA: Insufficient documentation

## 2019-03-01 DIAGNOSIS — M1990 Unspecified osteoarthritis, unspecified site: Secondary | ICD-10-CM | POA: Diagnosis not present

## 2019-03-01 DIAGNOSIS — Z20828 Contact with and (suspected) exposure to other viral communicable diseases: Secondary | ICD-10-CM | POA: Insufficient documentation

## 2019-03-01 DIAGNOSIS — F329 Major depressive disorder, single episode, unspecified: Secondary | ICD-10-CM | POA: Diagnosis not present

## 2019-03-01 DIAGNOSIS — E039 Hypothyroidism, unspecified: Secondary | ICD-10-CM | POA: Diagnosis not present

## 2019-03-01 DIAGNOSIS — I480 Paroxysmal atrial fibrillation: Secondary | ICD-10-CM | POA: Diagnosis not present

## 2019-03-01 DIAGNOSIS — I5042 Chronic combined systolic (congestive) and diastolic (congestive) heart failure: Secondary | ICD-10-CM | POA: Diagnosis not present

## 2019-03-01 DIAGNOSIS — D631 Anemia in chronic kidney disease: Secondary | ICD-10-CM | POA: Insufficient documentation

## 2019-03-01 DIAGNOSIS — Z888 Allergy status to other drugs, medicaments and biological substances status: Secondary | ICD-10-CM | POA: Insufficient documentation

## 2019-03-01 DIAGNOSIS — Z8673 Personal history of transient ischemic attack (TIA), and cerebral infarction without residual deficits: Secondary | ICD-10-CM | POA: Insufficient documentation

## 2019-03-01 DIAGNOSIS — Z7982 Long term (current) use of aspirin: Secondary | ICD-10-CM | POA: Insufficient documentation

## 2019-03-01 DIAGNOSIS — Z7901 Long term (current) use of anticoagulants: Secondary | ICD-10-CM | POA: Insufficient documentation

## 2019-03-01 DIAGNOSIS — Z951 Presence of aortocoronary bypass graft: Secondary | ICD-10-CM | POA: Insufficient documentation

## 2019-03-01 DIAGNOSIS — Y832 Surgical operation with anastomosis, bypass or graft as the cause of abnormal reaction of the patient, or of later complication, without mention of misadventure at the time of the procedure: Secondary | ICD-10-CM | POA: Diagnosis not present

## 2019-03-01 DIAGNOSIS — Z8249 Family history of ischemic heart disease and other diseases of the circulatory system: Secondary | ICD-10-CM | POA: Insufficient documentation

## 2019-03-01 DIAGNOSIS — E785 Hyperlipidemia, unspecified: Secondary | ICD-10-CM | POA: Insufficient documentation

## 2019-03-01 DIAGNOSIS — E1122 Type 2 diabetes mellitus with diabetic chronic kidney disease: Secondary | ICD-10-CM | POA: Diagnosis not present

## 2019-03-01 DIAGNOSIS — Z79890 Hormone replacement therapy (postmenopausal): Secondary | ICD-10-CM | POA: Diagnosis not present

## 2019-03-01 DIAGNOSIS — F419 Anxiety disorder, unspecified: Secondary | ICD-10-CM | POA: Diagnosis not present

## 2019-03-01 DIAGNOSIS — Z79899 Other long term (current) drug therapy: Secondary | ICD-10-CM | POA: Diagnosis not present

## 2019-03-01 DIAGNOSIS — I2510 Atherosclerotic heart disease of native coronary artery without angina pectoris: Secondary | ICD-10-CM | POA: Diagnosis not present

## 2019-03-01 DIAGNOSIS — T82898A Other specified complication of vascular prosthetic devices, implants and grafts, initial encounter: Secondary | ICD-10-CM | POA: Diagnosis present

## 2019-03-01 DIAGNOSIS — K219 Gastro-esophageal reflux disease without esophagitis: Secondary | ICD-10-CM | POA: Diagnosis not present

## 2019-03-01 DIAGNOSIS — Z87891 Personal history of nicotine dependence: Secondary | ICD-10-CM | POA: Insufficient documentation

## 2019-03-01 DIAGNOSIS — Z419 Encounter for procedure for purposes other than remedying health state, unspecified: Secondary | ICD-10-CM

## 2019-03-01 HISTORY — DX: Cardiac murmur, unspecified: R01.1

## 2019-03-01 HISTORY — PX: INSERTION OF DIALYSIS CATHETER: SHX1324

## 2019-03-01 HISTORY — PX: LIGATION ARTERIOVENOUS GORTEX GRAFT: SHX5947

## 2019-03-01 LAB — PROTIME-INR
INR: 3 — ABNORMAL HIGH (ref 0.8–1.2)
Prothrombin Time: 30.9 seconds — ABNORMAL HIGH (ref 11.4–15.2)

## 2019-03-01 LAB — GLUCOSE, CAPILLARY
Glucose-Capillary: 106 mg/dL — ABNORMAL HIGH (ref 70–99)
Glucose-Capillary: 147 mg/dL — ABNORMAL HIGH (ref 70–99)
Glucose-Capillary: 127 mg/dL — ABNORMAL HIGH (ref 70–99)

## 2019-03-01 LAB — SARS CORONAVIRUS 2 BY RT PCR (HOSPITAL ORDER, PERFORMED IN ~~LOC~~ HOSPITAL LAB): SARS Coronavirus 2: NEGATIVE

## 2019-03-01 LAB — POCT I-STAT 4, (NA,K, GLUC, HGB,HCT)
Glucose, Bld: 106 mg/dL — ABNORMAL HIGH (ref 70–99)
HCT: 39 % (ref 36.0–46.0)
Hemoglobin: 13.3 g/dL (ref 12.0–15.0)
Potassium: 3.6 mmol/L (ref 3.5–5.1)
Sodium: 138 mmol/L (ref 135–145)

## 2019-03-01 SURGERY — LIGATION ARTERIOVENOUS GORTEX GRAFT
Anesthesia: Monitor Anesthesia Care | Site: Groin | Laterality: Right

## 2019-03-01 MED ORDER — PROPOFOL 1000 MG/100ML IV EMUL
INTRAVENOUS | Status: AC
  Filled 2019-03-01: qty 100

## 2019-03-01 MED ORDER — SODIUM CHLORIDE 0.9 % IV SOLN
INTRAVENOUS | Status: DC | PRN
  Administered 2019-03-01: 500 mL

## 2019-03-01 MED ORDER — PROPOFOL 500 MG/50ML IV EMUL
INTRAVENOUS | Status: DC | PRN
  Administered 2019-03-01: 60 ug/kg/min via INTRAVENOUS

## 2019-03-01 MED ORDER — ACETAMINOPHEN-CODEINE #3 300-30 MG PO TABS: 1.0000 | ORAL_TABLET | Freq: Four times a day (QID) | ORAL | 0 refills | Status: DC | PRN

## 2019-03-01 MED ORDER — FENTANYL CITRATE (PF) 100 MCG/2ML IJ SOLN: 25.0000 ug | INTRAMUSCULAR | Status: DC | PRN

## 2019-03-01 MED ORDER — FENTANYL CITRATE (PF) 100 MCG/2ML IJ SOLN
INTRAMUSCULAR | Status: DC | PRN
  Administered 2019-03-01 (×2): 25 ug via INTRAVENOUS
  Administered 2019-03-01: 50 ug via INTRAVENOUS

## 2019-03-01 MED ORDER — PROPOFOL 10 MG/ML IV BOLUS
INTRAVENOUS | Status: AC
  Filled 2019-03-01: qty 20

## 2019-03-01 MED ORDER — HEPARIN SODIUM (PORCINE) 1000 UNIT/ML IJ SOLN
INTRAMUSCULAR | Status: DC | PRN
  Administered 2019-03-01: 1000 [IU]

## 2019-03-01 MED ORDER — ONDANSETRON HCL 4 MG/2ML IJ SOLN
INTRAMUSCULAR | Status: AC
  Filled 2019-03-01: qty 2

## 2019-03-01 MED ORDER — LIDOCAINE HCL (PF) 1 % IJ SOLN
INTRAMUSCULAR | Status: AC
  Filled 2019-03-01: qty 30

## 2019-03-01 MED ORDER — CARVEDILOL 25 MG PO TABS
25.0000 mg | ORAL_TABLET | Freq: Once | ORAL | Status: AC
  Administered 2019-03-01: 08:00:00 25 mg via ORAL
  Filled 2019-03-01: qty 1

## 2019-03-01 MED ORDER — LIDOCAINE HCL (PF) 1 % IJ SOLN
INTRAMUSCULAR | Status: DC | PRN
  Administered 2019-03-01: 20 mL

## 2019-03-01 MED ORDER — ONDANSETRON HCL 4 MG/2ML IJ SOLN
INTRAMUSCULAR | Status: DC | PRN
  Administered 2019-03-01: 4 mg via INTRAVENOUS

## 2019-03-01 MED ORDER — ACETAMINOPHEN 160 MG/5ML PO SOLN: 325.0000 mg | Freq: Once | ORAL | Status: DC | PRN

## 2019-03-01 MED ORDER — CEFAZOLIN SODIUM-DEXTROSE 2-4 GM/100ML-% IV SOLN
2.0000 g | INTRAVENOUS | Status: AC
  Administered 2019-03-01: 2 g via INTRAVENOUS
  Filled 2019-03-01: qty 100

## 2019-03-01 MED ORDER — FENTANYL CITRATE (PF) 250 MCG/5ML IJ SOLN
INTRAMUSCULAR | Status: AC
  Filled 2019-03-01: qty 5

## 2019-03-01 MED ORDER — PHENYLEPHRINE 40 MCG/ML (10ML) SYRINGE FOR IV PUSH (FOR BLOOD PRESSURE SUPPORT)
PREFILLED_SYRINGE | INTRAVENOUS | Status: AC
  Filled 2019-03-01: qty 10

## 2019-03-01 MED ORDER — IODIXANOL 320 MG/ML IV SOLN
INTRAVENOUS | Status: DC | PRN
  Administered 2019-03-01: 4 mL via INTRAVENOUS

## 2019-03-01 MED ORDER — HEPARIN SODIUM (PORCINE) 1000 UNIT/ML IJ SOLN
INTRAMUSCULAR | Status: AC
  Filled 2019-03-01: qty 1

## 2019-03-01 MED ORDER — LACTATED RINGERS IV SOLN: INTRAVENOUS | Status: DC

## 2019-03-01 MED ORDER — SODIUM CHLORIDE 0.9 % IV SOLN
INTRAVENOUS | Status: DC
  Administered 2019-03-01 (×2): via INTRAVENOUS

## 2019-03-01 MED ORDER — CARVEDILOL 12.5 MG PO TABS
ORAL_TABLET | ORAL | Status: AC
  Administered 2019-03-01: 25 mg via ORAL
  Filled 2019-03-01: qty 2

## 2019-03-01 MED ORDER — DEXAMETHASONE SODIUM PHOSPHATE 10 MG/ML IJ SOLN
INTRAMUSCULAR | Status: AC
  Filled 2019-03-01: qty 1

## 2019-03-01 MED ORDER — LIDOCAINE 2% (20 MG/ML) 5 ML SYRINGE
INTRAMUSCULAR | Status: AC
  Filled 2019-03-01: qty 5

## 2019-03-01 MED ORDER — 0.9 % SODIUM CHLORIDE (POUR BTL) OPTIME
TOPICAL | Status: DC | PRN
  Administered 2019-03-01: 1000 mL

## 2019-03-01 MED ORDER — ACETAMINOPHEN 10 MG/ML IV SOLN: 1000.0000 mg | Freq: Once | INTRAVENOUS | Status: DC | PRN

## 2019-03-01 MED ORDER — DEXAMETHASONE SODIUM PHOSPHATE 4 MG/ML IJ SOLN
INTRAMUSCULAR | Status: DC | PRN
  Administered 2019-03-01: 4 mg via INTRAVENOUS

## 2019-03-01 MED ORDER — SODIUM CHLORIDE 0.9 % IV SOLN: INTRAVENOUS | Status: DC

## 2019-03-01 MED ORDER — SODIUM CHLORIDE 0.9 % IV SOLN
INTRAVENOUS | Status: AC
  Filled 2019-03-01: qty 1.2

## 2019-03-01 MED ORDER — ACETAMINOPHEN 325 MG PO TABS: 325.0000 mg | ORAL_TABLET | Freq: Once | ORAL | Status: DC | PRN

## 2019-03-01 MED ORDER — LIDOCAINE 2% (20 MG/ML) 5 ML SYRINGE
INTRAMUSCULAR | Status: DC | PRN
  Administered 2019-03-01: 40 mg via INTRAVENOUS

## 2019-03-01 MED ORDER — MIDAZOLAM HCL 2 MG/2ML IJ SOLN
INTRAMUSCULAR | Status: AC
  Filled 2019-03-01: qty 2

## 2019-03-01 MED ORDER — MIDAZOLAM HCL 5 MG/5ML IJ SOLN
INTRAMUSCULAR | Status: DC | PRN
  Administered 2019-03-01: 2 mg via INTRAVENOUS

## 2019-03-01 MED ORDER — PROPOFOL 10 MG/ML IV BOLUS
INTRAVENOUS | Status: DC | PRN
  Administered 2019-03-01: 20 mg via INTRAVENOUS

## 2019-03-01 SURGICAL SUPPLY — 58 items
BAG DECANTER FOR FLEXI CONT (MISCELLANEOUS) ×3 IMPLANT
BIOPATCH RED 1 DISK 7.0 (GAUZE/BANDAGES/DRESSINGS) ×3 IMPLANT
CANISTER SUCT 3000ML PPV (MISCELLANEOUS) ×3 IMPLANT
CATH PALINDROME RT-P 15FX19CM (CATHETERS) IMPLANT
CATH PALINDROME RT-P 15FX23CM (CATHETERS) IMPLANT
CATH PALINDROME RT-P 15FX28CM (CATHETERS) IMPLANT
CATH PALINDROME RT-P 15FX55CM (CATHETERS) ×3 IMPLANT
CATH STRAIGHT 5FR 65CM (CATHETERS) IMPLANT
CHLORAPREP W/TINT 26 (MISCELLANEOUS) ×3 IMPLANT
COVER PROBE W GEL 5X96 (DRAPES) ×3 IMPLANT
COVER SURGICAL LIGHT HANDLE (MISCELLANEOUS) ×3 IMPLANT
COVER WAND RF STERILE (DRAPES) IMPLANT
DECANTER SPIKE VIAL GLASS SM (MISCELLANEOUS) IMPLANT
DERMABOND ADVANCED (GAUZE/BANDAGES/DRESSINGS) ×2
DERMABOND ADVANCED .7 DNX12 (GAUZE/BANDAGES/DRESSINGS) ×4 IMPLANT
DRAPE C-ARM 42X72 X-RAY (DRAPES) ×3 IMPLANT
DRAPE CHEST BREAST 15X10 FENES (DRAPES) ×3 IMPLANT
DRAPE ORTHO SPLIT 77X108 STRL (DRAPES) ×2
DRAPE SURG ORHT 6 SPLT 77X108 (DRAPES) ×4 IMPLANT
DRSG COVADERM 4X6 (GAUZE/BANDAGES/DRESSINGS) ×3 IMPLANT
ELECT REM PT RETURN 9FT ADLT (ELECTROSURGICAL) ×3
ELECTRODE REM PT RTRN 9FT ADLT (ELECTROSURGICAL) ×2 IMPLANT
GAUZE 4X4 16PLY RFD (DISPOSABLE) ×3 IMPLANT
GLOVE BIO SURGEON STRL SZ7.5 (GLOVE) ×3 IMPLANT
GLOVE BIOGEL PI IND STRL 6.5 (GLOVE) ×2 IMPLANT
GLOVE BIOGEL PI IND STRL 7.5 (GLOVE) ×4 IMPLANT
GLOVE BIOGEL PI INDICATOR 6.5 (GLOVE) ×1
GLOVE BIOGEL PI INDICATOR 7.5 (GLOVE) ×2
GLOVE ECLIPSE 7.0 STRL STRAW (GLOVE) ×6 IMPLANT
GOWN STRL REUS W/ TWL LRG LVL3 (GOWN DISPOSABLE) ×6 IMPLANT
GOWN STRL REUS W/TWL LRG LVL3 (GOWN DISPOSABLE) ×3
KIT BASIN OR (CUSTOM PROCEDURE TRAY) ×3 IMPLANT
KIT TURNOVER KIT B (KITS) ×3 IMPLANT
LOOP VESSEL MINI RED (MISCELLANEOUS) IMPLANT
NEEDLE 18GX1X1/2 (RX/OR ONLY) (NEEDLE) ×3 IMPLANT
NEEDLE HYPO 25GX1X1/2 BEV (NEEDLE) ×3 IMPLANT
NS IRRIG 1000ML POUR BTL (IV SOLUTION) ×3 IMPLANT
PACK CV ACCESS (CUSTOM PROCEDURE TRAY) ×3 IMPLANT
PACK SURGICAL SETUP 50X90 (CUSTOM PROCEDURE TRAY) ×3 IMPLANT
PAD ARMBOARD 7.5X6 YLW CONV (MISCELLANEOUS) ×6 IMPLANT
SET MICROPUNCTURE 5F STIFF (MISCELLANEOUS) ×3 IMPLANT
SPONGE SURGIFOAM ABS GEL 100 (HEMOSTASIS) IMPLANT
SUT ETHILON 3 0 PS 1 (SUTURE) ×3 IMPLANT
SUT PROLENE 5 0 C 1 24 (SUTURE) ×3 IMPLANT
SUT PROLENE 6 0 CC (SUTURE) ×3 IMPLANT
SUT VIC AB 3-0 SH 27 (SUTURE) ×1
SUT VIC AB 3-0 SH 27X BRD (SUTURE) ×2 IMPLANT
SUT VICRYL 4-0 PS2 18IN ABS (SUTURE) ×6 IMPLANT
SYR 10ML LL (SYRINGE) ×3 IMPLANT
SYR 20ML LL LF (SYRINGE) ×6 IMPLANT
SYR 5ML LL (SYRINGE) ×3 IMPLANT
SYR CONTROL 10ML LL (SYRINGE) ×3 IMPLANT
TOWEL GREEN STERILE (TOWEL DISPOSABLE) ×6 IMPLANT
TOWEL GREEN STERILE FF (TOWEL DISPOSABLE) ×3 IMPLANT
UNDERPAD 30X30 (UNDERPADS AND DIAPERS) ×3 IMPLANT
WATER STERILE IRR 1000ML POUR (IV SOLUTION) ×3 IMPLANT
WIRE AMPLATZ SS-J .035X180CM (WIRE) IMPLANT
WIRE TORQFLEX AUST .018X40CM (WIRE) ×3 IMPLANT

## 2019-03-01 NOTE — Op Note (Signed)
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 The patient was placed in supine position on the operating room table. After adequate sedation the patient's entire neck and chest were prepped and draped in usual sterile fashion. The patient was placed in Trendelenburg position.  Ultrasound was used to identify the right internal jugular vein which had normal compressibility and respiratory variation.  An introducer needle was used to cannulate right internal jugular vein.  There was good backflow of blood but I could not advance the guidewire.  I then redirected the wire and the carotid artery was inadvertently cannulated.  The needle was removed and hemostasis obtained with direct pressure for 5 minutes.  I then reattempted using a micropuncture needle.  I could not get the micropuncture wire to advance into the central circulation.  Contrast angiogram was obtained which showed the right internal jugular vein was occluded at the innominate junction.  There was a very large collateral just above the clavicle which was retrograde filling into the right subclavian vein and into the superior vena cava.  At this point further efforts were attempted in the neck.  I did not stick the left internal jugular vein because the patient has a pacemaker on the left side.  Needle was removed hemostasis obtained with direct pressure.  Attention was turned to the groins.  Both groins were prepped and draped in usual  sterile fashion.  Local anesthesia was infiltrated of the right common femoral vein.  The vein was cannulated with an introducer needle and an 035 J-tip guidewire advanced up into the right atrium under fluoroscopic guidance.  Neck sequential 06/13/2015 French sheath with peel-away sheath were placed over the guidewire the right femoral vein.  55 cm catheter was then placed through the peel-away sheath up in the right atrium.  The peel-away sheath was removed.  The catheter was tunneled subcutaneously out on the anterior thigh and the hub attached.  It was then sutured to the skin with nylon sutures.  The insertion site was closed with a Vicryl stitch.  Dermabond was applied.  Next the patient's entire left upper extremities prepped and draped in usual sterile fashion.  Local anesthesia was infiltrated near the antecubital crease.  A longitudinal incision was made in this location carried down through subcutaneous tissues down the level to graft.  Graft is dissected free circumferentially.  It was well incorporated.  It was then doubly ligated with a 2-0 silk tie.  Hemostasis was obtained.  The wound was closed with a 4-0 Vicryl subcuticular stitch.  Dermabond was applied.  The patient tolerated procedure well and there were no complications. Instrument sponge and needle counts correct in the case. The patient was taken to the recovery room in stable condition. Chest x-ray will be obtained in the recovery room.  Ruta Hinds, MD [facility] Office: [phone] Pager: [phone]

## 2019-03-01 NOTE — Progress Notes (Signed)
Device representative Mickel Baas) at bedside. Spoke with Dr. Smith  and he advised no programming changes needed for pacemaker. Device interrogated per device clinic order. Mickel Baas stated that she would ensure device clinic reviews device data.

## 2019-03-01 NOTE — Anesthesia Postprocedure Evaluation (Signed)
Anesthesia Post Note  Patient: Jocelyn Hill  Procedure(s) Performed: LIGATION ARTERIOVENOUS GORTEX GRAFT Left arm (Left Arm Upper) INSERTION OF DIALYSIS CATHETER RIGHT Femoral (Right Groin)     Patient location during evaluation: PACU Anesthesia Type: MAC Level of consciousness: awake and alert Pain management: pain level controlled Vital Signs Assessment: post-procedure vital signs reviewed and stable Respiratory status: spontaneous breathing, nonlabored ventilation, respiratory function stable and patient connected to nasal cannula oxygen Cardiovascular status: stable and blood pressure returned to baseline Postop Assessment: no apparent nausea or vomiting Anesthetic complications: no    Last Vitals:  Vitals:   03/01/19 1325 03/01/19 1327  BP:  (!) 151/86  Pulse: (!) 59   Resp: 13 18  Temp:  (!) 36.4 C  SpO2: 100% 100%    Last Pain:  Vitals:   03/01/19 1327  TempSrc:   PainSc: 0-No pain                 Effie Berkshire

## 2019-03-01 NOTE — Transfer of Care (Signed)
Immediate Anesthesia Transfer of Care Note  Patient: Jocelyn Hill  Procedure(s) Performed: LIGATION ARTERIOVENOUS GORTEX GRAFT Left arm (Left Arm Upper) INSERTION OF DIALYSIS CATHETER RIGHT Femoral (Right Groin)  Patient Location: PACU  Anesthesia Type:MAC  Level of Consciousness: awake and patient cooperative  Airway & Oxygen Therapy: Patient Spontanous Breathing and Patient connected to nasal cannula oxygen  Post-op Assessment: Report given to RN and Post -op Vital signs reviewed and stable  Post vital signs: Reviewed and stable  Last Vitals:  Vitals Value Taken Time  BP 159/85 03/01/19 1240  Temp    Pulse 59 03/01/19 1241  Resp 14 03/01/19 1241  SpO2 100 % 03/01/19 1241  Vitals shown include unvalidated device data.  Last Pain:  Vitals:   03/01/19 0755  TempSrc: Oral  PainSc: 9       Patients Stated Pain Goal: 9 (70/96/28 3662)  Complications: No apparent anesthesia complications

## 2019-03-01 NOTE — Anesthesia Procedure Notes (Signed)
Procedure Name: MAC Date/Time: 03/01/2019 11:42 AM Performed by: Orlie Dakin, CRNA Pre-anesthesia Checklist: Patient identified, Emergency Drugs available, Suction available and Patient being monitored Patient Re-evaluated:Patient Re-evaluated prior to induction Oxygen Delivery Method: Nasal cannula Preoxygenation: Pre-oxygenation with 100% oxygen Induction Type: IV induction

## 2019-03-01 NOTE — Interval H&P Note (Signed)
History and Physical Interval Note:  03/01/2019 7:27 AM  Jocelyn Hill  has presented today for surgery, with the diagnosis of STEAL SYNDROME LEFT HAND.  The various methods of treatment have been discussed with the patient and family. After consideration of risks, benefits and other options for treatment, the patient has consented to  Procedure(s): LIGATION ARTERIOVENOUS GORTEX GRAFT (Left) INSERTION OF DIALYSIS CATHETER RIGHT CHEST (Right) as a surgical intervention.  The patient's history has been reviewed, patient examined, no change in status, stable for surgery.  I have reviewed the patient's chart and labs.  Questions were answered to the patient's satisfaction.     Ruta Hinds

## 2019-03-01 NOTE — Progress Notes (Signed)
Pt c/o, " My chest is hurting. I think it's because I'm hungry." VSS. Has this same chest discomfort w/HD treatments per CRNA & pt. Tol po crackers and Sprite. Sleeps when left alone and appears comfortable. Dr Smith Robert updated- no new orders. Will continue to monitor.

## 2019-03-02 ENCOUNTER — Encounter (HOSPITAL_COMMUNITY): Payer: Self-pay | Admitting: Vascular Surgery

## 2019-03-16 ENCOUNTER — Other Ambulatory Visit: Payer: Self-pay

## 2019-03-16 ENCOUNTER — Other Ambulatory Visit (HOSPITAL_COMMUNITY): Payer: Self-pay | Admitting: Adult Health

## 2019-03-21 ENCOUNTER — Telehealth: Payer: Self-pay | Admitting: *Deleted

## 2019-03-21 NOTE — Telephone Encounter (Signed)
Patient called and stated her operative site is infected. S/P ligation of graft on 03/01/2019. She claims arm is tender and hot to touch. Denies fever or chills. She is due for HD on 03/22/2019. Appointment given with NP on 03/23/2019 11:15 am. She is in [place] and does not want to go to the ER. I instructed her to be sure and left nurses see arm at HD tomorrow for treatment and have them to call this office while she is in the chair for HD.

## 2019-03-22 DIAGNOSIS — T82398D Other mechanical complication of other vascular grafts, subsequent encounter: Secondary | ICD-10-CM

## 2019-03-23 ENCOUNTER — Other Ambulatory Visit: Payer: Self-pay

## 2019-03-23 ENCOUNTER — Ambulatory Visit (INDEPENDENT_AMBULATORY_CARE_PROVIDER_SITE_OTHER): Payer: Self-pay | Admitting: Family

## 2019-03-23 ENCOUNTER — Encounter: Payer: Self-pay | Admitting: Family

## 2019-03-23 VITALS — BP 197/88 | HR 67 | Temp 97.1°F | Resp 10 | Ht 69.0 in | Wt 125.0 lb

## 2019-03-23 DIAGNOSIS — N186 End stage renal disease: Secondary | ICD-10-CM

## 2019-03-23 DIAGNOSIS — I809 Phlebitis and thrombophlebitis of unspecified site: Secondary | ICD-10-CM

## 2019-03-23 DIAGNOSIS — Z992 Dependence on renal dialysis: Secondary | ICD-10-CM

## 2019-03-23 MED ORDER — CEPHALEXIN 500 MG PO CAPS: 500.0000 mg | ORAL_CAPSULE | Freq: Two times a day (BID) | ORAL | 0 refills | Status: DC

## 2019-03-23 NOTE — Progress Notes (Signed)
CC: evaluation of warm raised area with watery drainage at eft arm AVG ligation site  History of Present Illness  Jocelyn Hill is a 68 y.o. (1950/07/06) female who is s/p insertion of Palindrome catheter right femoral vein, attempted right internal jugular vein catheter placement, central venogram, ligation left arm AV graft on 03-01-19 by Dr. Oneida Alar for ischemic steal left hand.  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  She is also s/p leftupper arm arteriovenous graft (4 mm x 7 mm tapered Goretex graft)placement on 04-23-18 by Dr. Carlis Abbott.   She returns today after call received from pt on 03-21-19  and stated her operative site was infected. S/P ligation of graft on 03/01/2019. She claims arm is tender and hot to touch. Denies fever or chills. She states that she has received IV antibx in HD.   She states that her left hand feels much improved since the left arm AVG ligation, has some remaining numbness in her left fingertips.  She states that she has no problems with the right thigh dialysis catheter.   She dialyzes MWF via right thigh catheter at [facility] in [place] [facility] [place].  She is right hand dominant.   She takes warfarin, has a hx of atrial fib.    Past Medical History:  Diagnosis Date  . Anemia    low iron  . Anxiety   . Arthritis   . Bronchitis   . CHF (congestive heart failure) ([place])   . Coronary artery disease   . Depression   . Diabetes mellitus without complication ([place])   . Diet-controlled diabetes mellitus ([place])   . ESRD (end stage renal disease) ([place]) 03/16/2017   MWF- [facility] Alert  . Ganglion cyst    left wrist  . GERD (gastroesophageal reflux disease)   . Headache    chronic headaches  . Heart failure ([place])   . Heart murmur   . Hyperlipidemia   . Hypertension   . Hypothyroidism   . Mitral regurgitation   . Myocardial infarction ([place])    3x last one 2008  . PAF (paroxysmal atrial fibrillation) ([place])   . Pneumonia     x 2  . S/P Maze operation for atrial fibrillation 02/18/2018   Complete bilateral atrial lesion set using bipolar radiofrequency and cryothermy ablation with clipping of LA appendage  . S/P mitral valve replacement with bioprosthetic valve 02/18/2018   [facility] Mitral stented bovine pericardial tissue valve Model 7300 TFX Serial # [id number] Size 29  . Stroke [facility])    no lasting residual - ? 2014  . Umbilical hernia     Social History Social History   Tobacco Use  . Smoking status: Former Smoker    Types: Cigarettes    Quit date: 1980    Years since quitting: 40.7  . Smokeless tobacco: Never Used  Substance Use Topics  . Alcohol use: No  . Drug use: Not Currently    Comment: marijuana in the past    Family History Family History  Problem Relation Age of Onset  . Hypertension Mother     Surgical History Past Surgical History:  Procedure Laterality Date  . ABDOMINAL HYSTERECTOMY     PARTIALS  . AV FISTULA PLACEMENT Left 04/23/2018   Procedure: LEFT ARTERIOVENOUS (AV) GRAFT CREATION;  Surgeon: Marty Heck, MD;  Location: [place];  Service: Vascular;  Laterality: Left;  . BIV PACEMAKER INSERTION CRT-P N/A 02/26/2018   Procedure: BIV PACEMAKER INSERTION CRT-P;  Surgeon:  Constance Haw, MD;  Location: [place] CV LAB;  Service: Cardiovascular;  Laterality: N/A;  . CARDIAC CATHETERIZATION    . CESAREAN SECTION    . COLONOSCOPY    . CORONARY ARTERY BYPASS GRAFT N/A 02/18/2018   Procedure: CORONARY ARTERY BYPASS GRAFTING (CABG) WITH IMA. ENDOSCOPIC VEIN HARVEST. IMA TO LAD, SVG TO CIRC.;  Surgeon: Rexene Alberts, MD;  Location: [place];  Service: Open Heart Surgery;  Laterality: N/A;  . INSERTION OF DIALYSIS CATHETER N/A 02/18/2018   Procedure: PLACEMENT OF TEMPORARY HD CATHETER, POWER TRIALYSIS 13FR 20CM;  Surgeon: Rexene Alberts, MD;  Location: [place];  Service: Vascular;  Laterality: N/A;  . INSERTION OF DIALYSIS CATHETER Right 04/23/2018   Procedure:  INSERTION OF DIALYSIS CATHETER;  Surgeon: Marty Heck, MD;  Location: [place];  Service: Vascular;  Laterality: Right;  . INSERTION OF DIALYSIS CATHETER Right 03/01/2019   Procedure: INSERTION OF DIALYSIS CATHETER RIGHT Femoral;  Surgeon: Elam Dutch, MD;  Location: [place];  Service: Vascular;  Laterality: Right;  . IR NEPHRO TUBE REMOV/FL  04/02/2017  . IR NEPHROSTOGRAM RIGHT THRU EXISTING ACCESS  04/02/2017  . IR NEPHROSTOMY PLACEMENT RIGHT  03/27/2017  . IR THORACENTESIS ASP PLEURAL SPACE W/IMG GUIDE  03/24/2017  . LIGATION ARTERIOVENOUS GORTEX GRAFT Left 03/01/2019   Procedure: LIGATION ARTERIOVENOUS GORTEX GRAFT Left arm;  Surgeon: Elam Dutch, MD;  Location: [place];  Service: Vascular;  Laterality: Left;  Marland Kitchen MAZE N/A 02/18/2018   Procedure: MAZE;  Surgeon: Rexene Alberts, MD;  Location: [place];  Service: Open Heart Surgery;  Laterality: N/A;  . MITRAL VALVE REPLACEMENT N/A 02/18/2018   Procedure: MITRAL VALVE (MV) REPLACEMENT;  Surgeon: Rexene Alberts, MD;  Location: [place];  Service: Open Heart Surgery;  Laterality: N/A;  . MULTIPLE EXTRACTIONS WITH ALVEOLOPLASTY N/A 11/05/2017   Procedure: Extraction of tooth #'s 4,5,7,8,9,10,12,14 and 29 with alveoloplasty and gross debridement of remaining teeth;  Surgeon: [name], DDS;  Location: [place];  Service: Oral Surgery;  Laterality: N/A;  . PATENT FORAMEN OVALE(PFO) CLOSURE N/A 02/18/2018   Procedure: PATENT FORAMEN OVALE (PFO) CLOSURE;  Surgeon: Rexene Alberts, MD;  Location: [place];  Service: Open Heart Surgery;  Laterality: N/A;  . RIGHT/LEFT HEART CATH AND CORONARY ANGIOGRAPHY N/A 07/03/2017   Procedure: RIGHT/LEFT HEART CATH AND CORONARY ANGIOGRAPHY;  Surgeon: Larey Dresser, MD;  Location: [place] CV LAB;  Service: Cardiovascular;  Laterality: N/A;  . TEE WITHOUT CARDIOVERSION N/A 10/08/2017   Procedure: TRANSESOPHAGEAL ECHOCARDIOGRAM (TEE);  Surgeon: Larey Dresser, MD;  Location: [facility] ENDOSCOPY;  Service:  Cardiovascular;  Laterality: N/A;  . TEE WITHOUT CARDIOVERSION N/A 02/18/2018   Procedure: TRANSESOPHAGEAL ECHOCARDIOGRAM (TEE);  Surgeon: Rexene Alberts, MD;  Location: [place];  Service: Open Heart Surgery;  Laterality: N/A;  . TONSILLECTOMY    . TUBAL LIGATION      Allergies  Allergen Reactions  . Ace Inhibitors Anaphylaxis and Swelling  . Motrin Ib [Ibuprofen] Anaphylaxis    Current Outpatient Medications  Medication Sig Dispense Refill  . acetaminophen (TYLENOL) 500 MG tablet Take 1,500 mg by mouth every 6 (six) hours as needed for moderate pain or headache.    . albuterol (PROVENTIL HFA;VENTOLIN HFA) 108 (90 Base) MCG/ACT inhaler Inhale 2 puffs into the lungs every 6 (six) hours as needed for wheezing or shortness of breath.    Marland Kitchen amLODipine (NORVASC) 10 MG tablet Take 10 mg by mouth at bedtime.    Marland Kitchen aspirin  EC 81 MG EC tablet Take 1 tablet (81 mg total) by mouth daily. (Patient taking differently: Take 81 mg by mouth at bedtime. ) 30 tablet 0  . atorvastatin (LIPITOR) 80 MG tablet Take 1 tablet (80 mg total) by mouth daily. (Patient taking differently: Take 80 mg by mouth every evening. ) 30 tablet 3  . buPROPion (WELLBUTRIN) 100 MG tablet Take 100 mg by mouth 3 (three) times daily.    . busPIRone (BUSPAR) 15 MG tablet Take 1 tablet (15 mg total) by mouth 2 (two) times daily. 60 tablet 0  . carvedilol (COREG) 25 MG tablet Take 1 tablet (25 mg total) by mouth 2 (two) times daily.    . cholecalciferol (VITAMIN D) 1000 units tablet Take 1,000 Units by mouth at bedtime.     . Cyanocobalamin (B-12) 2500 MCG TABS Take 2,500 mcg by mouth every evening.    . diphenhydrAMINE (BENADRYL) 25 MG tablet Take 50 mg by mouth daily as needed for allergies.    Marland Kitchen doxazosin (CARDURA) 4 MG tablet Take 4 mg by mouth daily.     Marland Kitchen doxazosin (CARDURA) 8 MG tablet Take 8 mg by mouth at bedtime.    . DULoxetine (CYMBALTA) 30 MG capsule Take 30 mg by mouth daily.    Marland Kitchen gabapentin (NEURONTIN) 300 MG capsule Take  300 mg by mouth at bedtime.    . hydrALAZINE (APRESOLINE) 100 MG tablet Take 100 mg by mouth 3 (three) times daily.    . isosorbide mononitrate (IMDUR) 120 MG 24 hr tablet Take 1 tablet (120 mg total) by mouth daily. (Patient taking differently: Take 120 mg by mouth at bedtime. ) 30 tablet 0  . levothyroxine (SYNTHROID) 150 MCG tablet Take 150 mcg by mouth daily before breakfast.    . lidocaine-prilocaine (EMLA) cream Apply 1 application topically as needed (port access).    . montelukast (SINGULAIR) 10 MG tablet Take 1 tablet (10 mg total) by mouth daily. (Patient taking differently: Take 10 mg by mouth at bedtime. ) 30 tablet 0  . multivitamin (RENA-VIT) TABS tablet Take 1 tablet by mouth at bedtime.  0  . Naphazoline HCl (CLEAR EYES OP) Place 1 drop into both eyes daily as needed (dryness).    . nitroGLYCERIN (NITROSTAT) 0.4 MG SL tablet Place 0.4 mg under the tongue every 5 (five) minutes as needed for chest pain.    . Nutritional Supplements (FEEDING SUPPLEMENT, NEPRO CARB STEADY,) LIQD Take 237 mLs by mouth 3 (three) times daily between meals.  0  . ondansetron (ZOFRAN) 4 MG tablet Take 4 mg by mouth every 6 (six) hours as needed for nausea or vomiting.    . pantoprazole (PROTONIX) 40 MG tablet Take 1 tablet (40 mg total) by mouth 2 (two) times daily before a meal. (Patient taking differently: Take 40 mg by mouth daily. ) 60 tablet 0  . polyethylene glycol (MIRALAX / GLYCOLAX) packet Take 17 g by mouth daily. (Patient taking differently: Take 17 g by mouth daily as needed for mild constipation. ) 14 each 0  . tiZANidine (ZANAFLEX) 4 MG tablet Take 4 mg by mouth every 8 (eight) hours as needed for muscle spasms.    . traZODone (DESYREL) 100 MG tablet Take 100 mg by mouth at bedtime.    Marland Kitchen warfarin (COUMADIN) 3 MG tablet Take 3 mg by mouth every evening.    Marland Kitchen ALPRAZolam (XANAX) 0.25 MG tablet Take 1 tablet (0.25 mg total) by mouth at bedtime as needed for anxiety. (Patient not  taking: Reported on  03/23/2019) 30 tablet 0   No current facility-administered medications for this visit.      REVIEW OF SYSTEMS: see HPI for pertinent positives and negatives    PHYSICAL EXAMINATION:  Vitals:   03/23/19 1114  BP: (!) 197/88  Pulse: 67  Resp: 10  Temp: (!) 97.1 F (36.2 C)  TempSrc: Temporal  SpO2: 95%  Weight: 125 lb (56.7 kg)  Height: 5\' 9"  (1.753 m)   Body mass index is 18.46 kg/m.  General: Petite female in NAD. HEENT:  No gross abnormalities Pulmonary: Respirations are non-labored Abdomen: Soft and non-tender Musculoskeletal: There are no major deformities.   Neurologic: No focal weakness or paresthesias are detected Skin: There are no ulcer or rashes noted. Thrombosed vein at left arm AVG ligation site, draining thin watery drainage. See photo below   Left arm ac area, site of ligation of AV graft   Psychiatric: The patient has normal affect. Cardiovascular: There is a regular rate and rhythm without significant murmur appreciated.  Left radial pulse is 1+ palpable.    Medical Decision Making  Jocelyn Hill is a 68 y.o. female who is s/p insertion of Palindrome catheter right femoral vein, attempted right internal jugular vein catheter placement, central venogram, ligation left arm AV graft on 03-01-19 by Dr. Oneida Alar for ischemic steal left hand.  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  She is also s/p leftupper arm arteriovenous graft (4 mm x 7 mm tapered Goretex graft)placement on 04-23-18 by Dr. Carlis Abbott.   She returns today after call received from pt on 03-21-19  and stated her operative site was infected. S/P ligation of graft on 03/01/2019. She claims arm is tender and hot to touch. Denies fever or chills. She states that she has received IV antibx in HD.   She states that her left hand feels much improved since the left arm AVG ligation, has some remaining numbness in her left fingertips.  She states that she has no  problems with the right thigh dialysis catheter.   Dr. Scot Dock spoke with and examined pt (Dr. Oneida Alar is performing a laser ablation).   This is a phlebitis, thrombosed vein s/p ligation of AVG. As prophylaxis to infection, prescribed Keflex 500 mg po bid x 10 days, disp #20, 0 refills. Continue IV antibx in hemodialysis. Follow up with Dr. Oneida Alar in 2 weeks.      Clemon Chambers, RN, MSN, FNP-C [facility] [place] Office: [phone]  03/23/2019, 11:23 AM  Clinic MD: Laqueta Due

## 2019-03-24 ENCOUNTER — Encounter: Payer: Self-pay | Admitting: Cardiology

## 2019-03-31 ENCOUNTER — Ambulatory Visit: Payer: Medicare Other | Admitting: Vascular Surgery

## 2019-04-07 ENCOUNTER — Ambulatory Visit (INDEPENDENT_AMBULATORY_CARE_PROVIDER_SITE_OTHER): Payer: Self-pay | Admitting: Vascular Surgery

## 2019-04-07 ENCOUNTER — Other Ambulatory Visit: Payer: Self-pay

## 2019-04-07 ENCOUNTER — Encounter: Payer: Self-pay | Admitting: Vascular Surgery

## 2019-04-07 VITALS — BP 187/95 | HR 74 | Temp 97.8°F | Resp 14 | Ht 69.0 in | Wt 125.5 lb

## 2019-04-07 DIAGNOSIS — Z992 Dependence on renal dialysis: Secondary | ICD-10-CM

## 2019-04-07 DIAGNOSIS — N186 End stage renal disease: Secondary | ICD-10-CM

## 2019-04-07 NOTE — Progress Notes (Signed)
Patient is a 68 year old female who returns for follow-up today.  She had ligation of her left upper arm AV graft for steal March 01, 2019.  She still has some pain over the graft itself but the numbness and tingling in her left hand is improved.  He still has some mild numbness in the left hand.  She currently is dialyzing via a right femoral catheter.  This was placed due to the fact that she had occlusion of the distal right internal jugular vein.  The patient has a left-sided pacemaker.  She currently wishes to defer placing a new hemodialysis access until her left arm pain is improved.  She recently was having some drainage but this has now resolved.  She has no fever or chills.  Physical exam:  Vitals:   04/07/19 1310 04/07/19 1312  BP:  (!) 187/95  Pulse: 74   Resp: 14   Temp: 97.8 F (36.6 C)   TempSrc: Temporal   SpO2: 98%   Weight: 125 lb 8 oz (56.9 kg)   Height: 5\' 9"  (1.753 m)     Mild erythema near antecubital incision suture material removed today no drainage or fluctuance mild tenderness over the graft  Right femoral catheter clean with no tenderness  Assessment: Improved steal symptoms left hand but still with some persistent pain in the left arm AV graft site  Plan: The patient will follow-up in 1 month for consideration of a new hemodialysis access.  If she wishes to proceed with she will need to have a right side central venogram prior to placing a new access.  Ruta Hinds, MD [facility] [place] Office: [phone] Pager: [phone]

## 2019-04-11 ENCOUNTER — Other Ambulatory Visit: Payer: Self-pay | Admitting: Thoracic Surgery (Cardiothoracic Vascular Surgery)

## 2019-04-11 DIAGNOSIS — I340 Nonrheumatic mitral (valve) insufficiency: Secondary | ICD-10-CM

## 2019-04-11 DIAGNOSIS — I25119 Atherosclerotic heart disease of native coronary artery with unspecified angina pectoris: Secondary | ICD-10-CM

## 2019-04-12 ENCOUNTER — Inpatient Hospital Stay (HOSPITAL_COMMUNITY)
Admission: AD | Admit: 2019-04-12 | Discharge: 2019-04-20 | DRG: 252 | Disposition: A | Discharge status: Home Health | Payer: Medicare Other | Source: Other Acute Inpatient Hospital | Attending: Internal Medicine | Admitting: Internal Medicine

## 2019-04-12 ENCOUNTER — Encounter (HOSPITAL_COMMUNITY): Payer: Self-pay

## 2019-04-12 DIAGNOSIS — Z951 Presence of aortocoronary bypass graft: Secondary | ICD-10-CM | POA: Diagnosis not present

## 2019-04-12 DIAGNOSIS — I132 Hypertensive heart and chronic kidney disease with heart failure and with stage 5 chronic kidney disease, or end stage renal disease: Secondary | ICD-10-CM | POA: Diagnosis present

## 2019-04-12 DIAGNOSIS — Z888 Allergy status to other drugs, medicaments and biological substances status: Secondary | ICD-10-CM

## 2019-04-12 DIAGNOSIS — B9561 Methicillin susceptible Staphylococcus aureus infection as the cause of diseases classified elsewhere: Secondary | ICD-10-CM | POA: Diagnosis present

## 2019-04-12 DIAGNOSIS — N2581 Secondary hyperparathyroidism of renal origin: Secondary | ICD-10-CM | POA: Diagnosis present

## 2019-04-12 DIAGNOSIS — Z20828 Contact with and (suspected) exposure to other viral communicable diseases: Secondary | ICD-10-CM | POA: Diagnosis present

## 2019-04-12 DIAGNOSIS — R059 Cough, unspecified: Secondary | ICD-10-CM | POA: Diagnosis present

## 2019-04-12 DIAGNOSIS — I5042 Chronic combined systolic (congestive) and diastolic (congestive) heart failure: Secondary | ICD-10-CM | POA: Diagnosis present

## 2019-04-12 DIAGNOSIS — Z7901 Long term (current) use of anticoagulants: Secondary | ICD-10-CM

## 2019-04-12 DIAGNOSIS — Y832 Surgical operation with anastomosis, bypass or graft as the cause of abnormal reaction of the patient, or of later complication, without mention of misadventure at the time of the procedure: Secondary | ICD-10-CM | POA: Diagnosis present

## 2019-04-12 DIAGNOSIS — E440 Moderate protein-calorie malnutrition: Secondary | ICD-10-CM | POA: Diagnosis present

## 2019-04-12 DIAGNOSIS — N189 Chronic kidney disease, unspecified: Secondary | ICD-10-CM | POA: Diagnosis present

## 2019-04-12 DIAGNOSIS — K219 Gastro-esophageal reflux disease without esophagitis: Secondary | ICD-10-CM | POA: Diagnosis present

## 2019-04-12 DIAGNOSIS — I480 Paroxysmal atrial fibrillation: Secondary | ICD-10-CM | POA: Diagnosis present

## 2019-04-12 DIAGNOSIS — Z87891 Personal history of nicotine dependence: Secondary | ICD-10-CM

## 2019-04-12 DIAGNOSIS — K319 Disease of stomach and duodenum, unspecified: Secondary | ICD-10-CM | POA: Diagnosis present

## 2019-04-12 DIAGNOSIS — R6884 Jaw pain: Secondary | ICD-10-CM | POA: Diagnosis present

## 2019-04-12 DIAGNOSIS — E785 Hyperlipidemia, unspecified: Secondary | ICD-10-CM | POA: Diagnosis present

## 2019-04-12 DIAGNOSIS — Z886 Allergy status to analgesic agent status: Secondary | ICD-10-CM

## 2019-04-12 DIAGNOSIS — E1129 Type 2 diabetes mellitus with other diabetic kidney complication: Secondary | ICD-10-CM | POA: Diagnosis present

## 2019-04-12 DIAGNOSIS — Z8673 Personal history of transient ischemic attack (TIA), and cerebral infarction without residual deficits: Secondary | ICD-10-CM

## 2019-04-12 DIAGNOSIS — Z950 Presence of cardiac pacemaker: Secondary | ICD-10-CM

## 2019-04-12 DIAGNOSIS — Z8701 Personal history of pneumonia (recurrent): Secondary | ICD-10-CM

## 2019-04-12 DIAGNOSIS — Z79890 Hormone replacement therapy (postmenopausal): Secondary | ICD-10-CM

## 2019-04-12 DIAGNOSIS — Z992 Dependence on renal dialysis: Secondary | ICD-10-CM | POA: Diagnosis not present

## 2019-04-12 DIAGNOSIS — I639 Cerebral infarction, unspecified: Secondary | ICD-10-CM | POA: Diagnosis present

## 2019-04-12 DIAGNOSIS — I160 Hypertensive urgency: Secondary | ICD-10-CM

## 2019-04-12 DIAGNOSIS — Z79899 Other long term (current) drug therapy: Secondary | ICD-10-CM

## 2019-04-12 DIAGNOSIS — T82898A Other specified complication of vascular prosthetic devices, implants and grafts, initial encounter: Secondary | ICD-10-CM | POA: Diagnosis not present

## 2019-04-12 DIAGNOSIS — F419 Anxiety disorder, unspecified: Secondary | ICD-10-CM | POA: Diagnosis present

## 2019-04-12 DIAGNOSIS — D631 Anemia in chronic kidney disease: Secondary | ICD-10-CM | POA: Diagnosis present

## 2019-04-12 DIAGNOSIS — F329 Major depressive disorder, single episode, unspecified: Secondary | ICD-10-CM | POA: Diagnosis present

## 2019-04-12 DIAGNOSIS — K5900 Constipation, unspecified: Secondary | ICD-10-CM | POA: Diagnosis present

## 2019-04-12 DIAGNOSIS — I2510 Atherosclerotic heart disease of native coronary artery without angina pectoris: Secondary | ICD-10-CM | POA: Diagnosis present

## 2019-04-12 DIAGNOSIS — N184 Chronic kidney disease, stage 4 (severe): Secondary | ICD-10-CM

## 2019-04-12 DIAGNOSIS — Z953 Presence of xenogenic heart valve: Secondary | ICD-10-CM | POA: Diagnosis not present

## 2019-04-12 DIAGNOSIS — R0602 Shortness of breath: Secondary | ICD-10-CM | POA: Diagnosis present

## 2019-04-12 DIAGNOSIS — Z8679 Personal history of other diseases of the circulatory system: Secondary | ICD-10-CM

## 2019-04-12 DIAGNOSIS — Z7982 Long term (current) use of aspirin: Secondary | ICD-10-CM

## 2019-04-12 DIAGNOSIS — R05 Cough: Secondary | ICD-10-CM

## 2019-04-12 DIAGNOSIS — T827XXD Infection and inflammatory reaction due to other cardiac and vascular devices, implants and grafts, subsequent encounter: Secondary | ICD-10-CM | POA: Diagnosis not present

## 2019-04-12 DIAGNOSIS — N186 End stage renal disease: Secondary | ICD-10-CM

## 2019-04-12 DIAGNOSIS — J40 Bronchitis, not specified as acute or chronic: Secondary | ICD-10-CM | POA: Diagnosis present

## 2019-04-12 DIAGNOSIS — I10 Essential (primary) hypertension: Secondary | ICD-10-CM | POA: Diagnosis not present

## 2019-04-12 DIAGNOSIS — E039 Hypothyroidism, unspecified: Secondary | ICD-10-CM | POA: Diagnosis present

## 2019-04-12 DIAGNOSIS — Z90710 Acquired absence of both cervix and uterus: Secondary | ICD-10-CM

## 2019-04-12 DIAGNOSIS — T827XXA Infection and inflammatory reaction due to other cardiac and vascular devices, implants and grafts, initial encounter: Secondary | ICD-10-CM | POA: Insufficient documentation

## 2019-04-12 DIAGNOSIS — Z8249 Family history of ischemic heart disease and other diseases of the circulatory system: Secondary | ICD-10-CM

## 2019-04-12 DIAGNOSIS — Z681 Body mass index (BMI) 19 or less, adult: Secondary | ICD-10-CM

## 2019-04-12 DIAGNOSIS — E1122 Type 2 diabetes mellitus with diabetic chronic kidney disease: Secondary | ICD-10-CM | POA: Diagnosis present

## 2019-04-12 DIAGNOSIS — F32A Depression, unspecified: Secondary | ICD-10-CM | POA: Diagnosis present

## 2019-04-12 DIAGNOSIS — I252 Old myocardial infarction: Secondary | ICD-10-CM

## 2019-04-12 DIAGNOSIS — F418 Other specified anxiety disorders: Secondary | ICD-10-CM | POA: Diagnosis present

## 2019-04-12 LAB — BASIC METABOLIC PANEL
Anion gap: 12 (ref 5–15)
BUN: 31 mg/dL — ABNORMAL HIGH (ref 8–23)
CO2: 20 mmol/L — ABNORMAL LOW (ref 22–32)
Calcium: 9.6 mg/dL (ref 8.9–10.3)
Chloride: 103 mmol/L (ref 98–111)
Creatinine, Ser: 4.18 mg/dL — ABNORMAL HIGH (ref 0.44–1.00)
GFR calc Af Amer: 12 mL/min — ABNORMAL LOW (ref >60)
GFR calc non Af Amer: 10 mL/min — ABNORMAL LOW (ref >60)
Glucose, Bld: 101 mg/dL — ABNORMAL HIGH (ref 70–99)
Potassium: 4.6 mmol/L (ref 3.5–5.1)
Sodium: 135 mmol/L (ref 135–145)

## 2019-04-12 LAB — PROTIME-INR
INR: 2 — ABNORMAL HIGH (ref 0.8–1.2)
Prothrombin Time: 22.2 seconds — ABNORMAL HIGH (ref 11.4–15.2)

## 2019-04-12 LAB — CBC
HCT: 35.4 % — ABNORMAL LOW (ref 36.0–46.0)
Hemoglobin: 12.1 g/dL (ref 12.0–15.0)
MCH: 28.4 pg (ref 26.0–34.0)
MCHC: 34.2 g/dL (ref 30.0–36.0)
MCV: 83.1 fL (ref 80.0–100.0)
Platelets: 217 10*3/uL (ref 150–400)
RBC: 4.26 MIL/uL (ref 3.87–5.11)
RDW: 14.3 % (ref 11.5–15.5)
WBC: 12.4 10*3/uL — ABNORMAL HIGH (ref 4.0–10.5)
nRBC: 0 % (ref 0.0–0.2)

## 2019-04-12 LAB — APTT: aPTT: 39 seconds — ABNORMAL HIGH (ref 24–36)

## 2019-04-12 MED ORDER — HYDRALAZINE HCL 50 MG PO TABS
100.0000 mg | ORAL_TABLET | Freq: Three times a day (TID) | ORAL | Status: DC
  Administered 2019-04-13 – 2019-04-20 (×19): 100 mg via ORAL
  Filled 2019-04-12 (×19): qty 2

## 2019-04-12 MED ORDER — ACETAMINOPHEN 325 MG PO TABS: 650.0000 mg | ORAL_TABLET | Freq: Four times a day (QID) | ORAL | Status: DC | PRN

## 2019-04-12 MED ORDER — DM-GUAIFENESIN ER 30-600 MG PO TB12
1.0000 | ORAL_TABLET | Freq: Two times a day (BID) | ORAL | Status: DC | PRN
  Administered 2019-04-16 – 2019-04-19 (×5): 1 via ORAL
  Filled 2019-04-12 (×7): qty 1

## 2019-04-12 MED ORDER — VITAMIN K1 10 MG/ML IJ SOLN
2.0000 mg | Freq: Once | INTRAVENOUS | Status: AC
  Administered 2019-04-13: 2 mg via INTRAVENOUS
  Filled 2019-04-12: qty 0.2

## 2019-04-12 MED ORDER — BUPROPION HCL 100 MG PO TABS
100.0000 mg | ORAL_TABLET | Freq: Three times a day (TID) | ORAL | Status: DC
  Administered 2019-04-14 – 2019-04-20 (×19): 100 mg via ORAL
  Filled 2019-04-12 (×26): qty 1

## 2019-04-12 MED ORDER — ONDANSETRON HCL 4 MG/2ML IJ SOLN
4.0000 mg | Freq: Three times a day (TID) | INTRAMUSCULAR | Status: DC | PRN
  Administered 2019-04-16 – 2019-04-19 (×5): 4 mg via INTRAVENOUS
  Filled 2019-04-12 (×6): qty 2

## 2019-04-12 MED ORDER — ISOSORBIDE MONONITRATE ER 60 MG PO TB24
120.0000 mg | ORAL_TABLET | Freq: Every day | ORAL | Status: DC
  Administered 2019-04-13 – 2019-04-19 (×8): 120 mg via ORAL
  Filled 2019-04-12 (×8): qty 2

## 2019-04-12 MED ORDER — TORSEMIDE 20 MG PO TABS
100.0000 mg | ORAL_TABLET | Freq: Every day | ORAL | Status: DC
  Administered 2019-04-14 – 2019-04-17 (×4): 100 mg via ORAL
  Filled 2019-04-12 (×4): qty 5

## 2019-04-12 MED ORDER — MORPHINE SULFATE (PF) 2 MG/ML IV SOLN
1.0000 mg | INTRAVENOUS | Status: DC | PRN
  Administered 2019-04-12 – 2019-04-16 (×13): 1 mg via INTRAVENOUS
  Filled 2019-04-12 (×13): qty 1

## 2019-04-12 MED ORDER — OXYCODONE-ACETAMINOPHEN 5-325 MG PO TABS
1.0000 | ORAL_TABLET | ORAL | Status: DC | PRN
  Administered 2019-04-13 – 2019-04-20 (×32): 1 via ORAL
  Filled 2019-04-12 (×31): qty 1

## 2019-04-12 MED ORDER — LABETALOL HCL 5 MG/ML IV SOLN: 10.0000 mg | Freq: Once | INTRAVENOUS | Status: DC

## 2019-04-12 MED ORDER — ATORVASTATIN CALCIUM 80 MG PO TABS
80.0000 mg | ORAL_TABLET | Freq: Every evening | ORAL | Status: DC
  Administered 2019-04-13 – 2019-04-19 (×7): 80 mg via ORAL
  Filled 2019-04-12 (×7): qty 1

## 2019-04-12 MED ORDER — HEPARIN SODIUM (PORCINE) 5000 UNIT/ML IJ SOLN: 5000.0000 [IU] | Freq: Three times a day (TID) | INTRAMUSCULAR | Status: DC

## 2019-04-12 MED ORDER — ALPRAZOLAM 0.25 MG PO TABS
0.2500 mg | ORAL_TABLET | Freq: Every evening | ORAL | Status: DC | PRN
  Administered 2019-04-18 – 2019-04-19 (×2): 0.25 mg via ORAL
  Filled 2019-04-12 (×2): qty 1

## 2019-04-12 MED ORDER — DOXAZOSIN MESYLATE 2 MG PO TABS
8.0000 mg | ORAL_TABLET | Freq: Every day | ORAL | Status: DC
  Administered 2019-04-13 – 2019-04-19 (×8): 8 mg via ORAL
  Filled 2019-04-12 (×8): qty 4

## 2019-04-12 MED ORDER — BUSPIRONE HCL 5 MG PO TABS
15.0000 mg | ORAL_TABLET | Freq: Two times a day (BID) | ORAL | Status: DC
  Administered 2019-04-14 – 2019-04-20 (×13): 15 mg via ORAL
  Filled 2019-04-12 (×13): qty 3

## 2019-04-12 MED ORDER — PANTOPRAZOLE SODIUM 40 MG PO TBEC: 40.0000 mg | DELAYED_RELEASE_TABLET | Freq: Every day | ORAL | Status: DC

## 2019-04-12 MED ORDER — CARVEDILOL 6.25 MG PO TABS
6.2500 mg | ORAL_TABLET | Freq: Two times a day (BID) | ORAL | Status: DC
  Administered 2019-04-13 – 2019-04-20 (×15): 6.25 mg via ORAL
  Filled 2019-04-12 (×15): qty 1

## 2019-04-12 MED ORDER — POLYETHYLENE GLYCOL 3350 17 G PO PACK: 17.0000 g | PACK | Freq: Every day | ORAL | Status: DC | PRN

## 2019-04-12 MED ORDER — DULOXETINE HCL 30 MG PO CPEP
30.0000 mg | ORAL_CAPSULE | Freq: Every day | ORAL | Status: DC
  Administered 2019-04-14 – 2019-04-20 (×7): 30 mg via ORAL
  Filled 2019-04-12 (×7): qty 1

## 2019-04-12 MED ORDER — HYDRALAZINE HCL 50 MG PO TABS
50.0000 mg | ORAL_TABLET | Freq: Three times a day (TID) | ORAL | Status: DC | PRN
  Administered 2019-04-14 (×2): 50 mg via ORAL
  Filled 2019-04-12: qty 1

## 2019-04-12 MED ORDER — VITAMIN K1 10 MG/ML IJ SOLN
2.0000 mg | Freq: Once | INTRAMUSCULAR | Status: DC
  Filled 2019-04-12: qty 0.2

## 2019-04-12 MED ORDER — NITROGLYCERIN 0.4 MG SL SUBL
0.4000 mg | SUBLINGUAL_TABLET | SUBLINGUAL | Status: DC | PRN
  Administered 2019-04-15 – 2019-04-18 (×7): 0.4 mg via SUBLINGUAL
  Filled 2019-04-12 (×4): qty 1

## 2019-04-12 MED ORDER — VITAMIN B-12 1000 MCG PO TABS
2500.0000 ug | ORAL_TABLET | Freq: Every evening | ORAL | Status: DC
  Administered 2019-04-14 – 2019-04-19 (×6): 2500 ug via ORAL
  Filled 2019-04-12 (×7): qty 3

## 2019-04-12 MED ORDER — ASPIRIN EC 81 MG PO TBEC
81.0000 mg | DELAYED_RELEASE_TABLET | Freq: Every day | ORAL | Status: DC
  Administered 2019-04-13 – 2019-04-19 (×8): 81 mg via ORAL
  Filled 2019-04-12 (×9): qty 1

## 2019-04-12 MED ORDER — ALBUTEROL SULFATE (2.5 MG/3ML) 0.083% IN NEBU: 3.0000 mL | INHALATION_SOLUTION | RESPIRATORY_TRACT | Status: DC | PRN

## 2019-04-12 MED ORDER — LEVOTHYROXINE SODIUM 75 MCG PO TABS
150.0000 ug | ORAL_TABLET | Freq: Every day | ORAL | Status: DC
  Administered 2019-04-13 – 2019-04-20 (×8): 150 ug via ORAL
  Filled 2019-04-12 (×8): qty 2

## 2019-04-12 MED ORDER — TRAZODONE HCL 100 MG PO TABS
100.0000 mg | ORAL_TABLET | Freq: Every day | ORAL | Status: DC
  Administered 2019-04-13 – 2019-04-19 (×8): 100 mg via ORAL
  Filled 2019-04-12 (×8): qty 1

## 2019-04-12 MED ORDER — AMLODIPINE BESYLATE 10 MG PO TABS
10.0000 mg | ORAL_TABLET | Freq: Every day | ORAL | Status: DC
  Administered 2019-04-13 – 2019-04-19 (×8): 10 mg via ORAL
  Filled 2019-04-12 (×8): qty 1

## 2019-04-12 NOTE — Consult Note (Signed)
REASON FOR CONSULT:    Infected left upper arm graft.  The consult is requested by Dr. Blaine Hamper  ASSESSMENT & PLAN:   INFECTED LEFT UPPER ARM GRAFT: This patient has an infected left upper arm graft which was ligated on 03/01/2019.  She is on Coumadin.  She has a functioning right femoral tunneled dialysis catheter which does not appear to be infected.  I have recommended removal of her left upper arm graft.  Her INR is 2.  I have written to give her 2 mg of vitamin K intravenously tonight with a follow-up pro time tomorrow morning.  I have discussed the indications and potential complications with the patient who is agreeable to proceed.  Preop orders have been written.  Deitra Mayo, MD, FACS Beeper [phone] Office: [phone]   HPI:   Jocelyn Hill is a pleasant 68 y.o. female, who had a left upper arm AV graft placed on 04/23/2018.  This was a 4-7 mm PTFE graft.  She developed steal symptoms and this was ligated on 03/01/2019.  She was seen by the nurse practitioner on 03/23/2019 with some slight infection at the site of her surgery.  She was receiving intravenous antibiotics during hemodialysis.   She was last seen by Dr. Oneida Alar on 04/07/2019.  The numbness in the left hand had improved.  She had an occluded right IJ pacemaker on the left side which is why she had a right femoral catheter placed according to the operative reports.  She was going to be considered for new access once the pain in her left arm had resolved.  It was felt that she would need a right sided central venogram prior placing new access in the right arm.  She presents tonight as a [facility] with increasing redness and swelling over her left upper arm graft.  Of note, she is on Coumadin for atrial fibrillation.  She dialyzes on Monday Wednesdays and Fridays.  She is right-hand dominant.  Past Medical History:  Diagnosis Date  . Anemia    low iron  . Anxiety   . Arthritis   . Bronchitis   .  CHF (congestive heart failure) ([place])   . Coronary artery disease   . Depression   . Diabetes mellitus without complication ([place])   . Diet-controlled diabetes mellitus ([place])   . ESRD (end stage renal disease) ([place]) 03/16/2017   MWF- [facility]  . Ganglion cyst    left wrist  . GERD (gastroesophageal reflux disease)   . Headache    chronic headaches  . Heart failure ([place])   . Heart murmur   . Hyperlipidemia   . Hypertension   . Hypothyroidism   . Mitral regurgitation   . Myocardial infarction ([place])    3x last one 2008  . PAF (paroxysmal atrial fibrillation) ([place])   . Pneumonia    x 2  . S/P Maze operation for atrial fibrillation 02/18/2018   Complete bilateral atrial lesion set using bipolar radiofrequency and cryothermy ablation with clipping of LA appendage  . S/P mitral valve replacement with bioprosthetic valve 02/18/2018   [facility] Mitral stented bovine pericardial tissue valve Model 7300 TFX Serial # [id number] Size 29  . Stroke [facility])    no lasting residual - ? 2014  . Umbilical hernia     Family History  Problem Relation Age of Onset  . Hypertension Mother     SOCIAL HISTORY: Social History   Socioeconomic History  . Marital status: Divorced  Spouse name: Not on file  . Number of children: Not on file  . Years of education: Not on file  . Highest education level: Not on file  Occupational History  . Not on file  Social Needs  . Financial resource strain: Not on file  . Food insecurity    Worry: Not on file    Inability: Not on file  . Transportation needs    Medical: Not on file    Non-medical: Not on file  Tobacco Use  . Smoking status: Former Smoker    Types: Cigarettes    Quit date: 1980    Years since quitting: 40.8  . Smokeless tobacco: Never Used  Substance and Sexual Activity  . Alcohol use: No  . Drug use: Not Currently    Comment: marijuana in the past  . Sexual activity: Not Currently  Lifestyle  . Physical activity    Days per week:  Not on file    Minutes per session: Not on file  . Stress: Not on file  Relationships  . Social Herbalist on phone: Not on file    Gets together: Not on file    Attends religious service: Not on file    Active member of club or organization: Not on file    Attends meetings of clubs or organizations: Not on file    Relationship status: Not on file  . Intimate partner violence    Fear of current or ex partner: Not on file    Emotionally abused: Not on file    Physically abused: Not on file    Forced sexual activity: Not on file  Other Topics Concern  . Not on file  Social History Narrative  . Not on file    Allergies  Allergen Reactions  . Ace Inhibitors Anaphylaxis and Swelling  . Motrin Ib [Ibuprofen] Anaphylaxis    Current Facility-Administered Medications  Medication Dose Route Frequency Provider Last Rate Last Dose  . acetaminophen (TYLENOL) tablet 650 mg  650 mg Oral Q6H PRN Ivor Costa, MD      . albuterol (PROVENTIL) (2.5 MG/3ML) 0.083% nebulizer solution 3 mL  3 mL Inhalation Q4H PRN Ivor Costa, MD      . ALPRAZolam Duanne Moron) tablet 0.25 mg  0.25 mg Oral QHS PRN Ivor Costa, MD      . Derrill Memo ON 04/13/2019] amLODipine (NORVASC) tablet 10 mg  10 mg Oral QHS Ivor Costa, MD      . Derrill Memo ON 04/13/2019] aspirin EC tablet 81 mg  81 mg Oral QHS Ivor Costa, MD      . Derrill Memo ON 04/13/2019] atorvastatin (LIPITOR) tablet 80 mg  80 mg Oral QPM Ivor Costa, MD      . Derrill Memo ON 04/13/2019] B-12 TABS 2,500 mcg  2,500 mcg Oral QPM Ivor Costa, MD      . Derrill Memo ON 04/13/2019] buPROPion [facility]) tablet 100 mg  100 mg Oral TID Ivor Costa, MD      . Derrill Memo ON 04/13/2019] busPIRone (BUSPAR) tablet 15 mg  15 mg Oral BID Ivor Costa, MD      . Derrill Memo ON 04/13/2019] carvedilol (COREG) tablet 6.25 mg  6.25 mg Oral BID Ivor Costa, MD      . dextromethorphan-guaiFENesin [facility] DM) 30-600 MG per 12 hr tablet 1 tablet  1 tablet Oral BID PRN Ivor Costa, MD      . Derrill Memo ON 04/13/2019]  doxazosin (CARDURA) tablet 8 mg  8 mg Oral QHS Ivor Costa, MD      . [  START ON 04/13/2019] DULoxetine (CYMBALTA) DR capsule 30 mg  30 mg Oral Daily Ivor Costa, MD      . Derrill Memo ON 04/13/2019] hydrALAZINE (APRESOLINE) tablet 100 mg  100 mg Oral TID Ivor Costa, MD      . hydrALAZINE (APRESOLINE) tablet 50 mg  50 mg Oral TID PRN Ivor Costa, MD      . Derrill Memo ON 04/13/2019] isosorbide mononitrate (IMDUR) 24 hr tablet 120 mg  120 mg Oral QHS Ivor Costa, MD      . Derrill Memo ON 04/13/2019] levothyroxine (SYNTHROID) tablet 150 mcg  150 mcg Oral QAC breakfast Ivor Costa, MD      . morphine 2 MG/ML injection 1 mg  1 mg Intravenous Q4H PRN Ivor Costa, MD   1 mg at 04/12/19 2306  . nitroGLYCERIN (NITROSTAT) SL tablet 0.4 mg  0.4 mg Sublingual Q5 min PRN Ivor Costa, MD      . ondansetron [facility]) injection 4 mg  4 mg Intravenous Q8H PRN Ivor Costa, MD      . oxyCODONE-acetaminophen (PERCOCET/ROXICET) 5-325 MG per tablet 1 tablet  1 tablet Oral Q4H PRN Ivor Costa, MD      . Derrill Memo ON 04/13/2019] pantoprazole (PROTONIX) EC tablet 40 mg  40 mg Oral Daily Ivor Costa, MD      . polyethylene glycol (MIRALAX / GLYCOLAX) packet 17 g  17 g Oral Daily PRN Ivor Costa, MD      . Derrill Memo ON 04/13/2019] torsemide (DEMADEX) tablet 100 mg  100 mg Oral Daily Ivor Costa, MD      . Derrill Memo ON 04/13/2019] traZODone (DESYREL) tablet 100 mg  100 mg Oral QHS Ivor Costa, MD        REVIEW OF SYSTEMS:  [X]  denotes positive finding, [ ]  denotes negative finding Cardiac  Comments:  Chest pain or chest pressure:    Shortness of breath upon exertion:    Short of breath when lying flat:    Irregular heart rhythm:        Vascular    Pain in calf, thigh, or hip brought on by ambulation:    Pain in feet at night that wakes you up from your sleep:     Blood clot in your veins:    Leg swelling:         Pulmonary    Oxygen at home:    Productive cough:     Wheezing:         Neurologic    Sudden weakness in arms or legs:     Sudden  numbness in arms or legs:     Sudden onset of difficulty speaking or slurred speech:    Temporary loss of vision in one eye:     Problems with dizziness:         Gastrointestinal    Blood in stool:     Vomited blood:         Genitourinary    Burning when urinating:     Blood in urine:        Psychiatric    Major depression:         Hematologic    Bleeding problems:    Problems with blood clotting too easily:        Skin    Rashes or ulcers: x  swelling and redness over left upper arm graft.      Constitutional    Fever or chills:     PHYSICAL EXAM:   Vitals:   04/12/19 2137  BP: (!) 193/93  Pulse: 86  Resp: 18  Temp: 98 F (36.7 C)  TempSrc: Oral  SpO2: 100%  Weight: 54.4 kg  Height: 5\' 9"  (1.753 m)    GENERAL: The patient is a well-nourished female, in no acute distress. The vital signs are documented above. CARDIAC: There is a regular rate and rhythm.  VASCULAR: Patient has a palpable left radial pulse. Her left upper arm graft has been ligated.  There is cellulitis and fluctuance over the midportion of the graft in the upper arm. PULMONARY: There is good air exchange bilaterally without wheezing or rales. ABDOMEN: Soft and non-tender with normal pitched bowel sounds.  MUSCULOSKELETAL: There are no major deformities or cyanosis. NEUROLOGIC: No focal weakness or paresthesias are detected. SKIN: There are no ulcers or rashes noted. PSYCHIATRIC: The patient has a normal affect.  DATA:    LABS: Potassium is 4.6.  White blood cell count 12.4.  Hemoglobin 12.1.  Platelets 217,000.  INR is 2.0.

## 2019-04-12 NOTE — H&P (Signed)
History and Physical    Jocelyn Hill [id number] DOB: [birth year] DOA: 04/12/2019  Referring MD/NP/PA:   PCP: [name], MD   Patient coming from:  The patient is coming from home.  At baseline, pt is independent for most of ADL.        Chief Complaint: left arm pain due to AV fistula infection  HPI: Jocelyn Hill is a 68 y.o. female with medical history significant of ESRD on dialysis (MWF), hypertension, hyperlipidemia, diet-controlled DM, stroke, GERD, hypothyroidism, depression with anxiety, mitral valve replacement with bioprosthetic valve, atrial fibrillation on Coumadin, sCHF with EF 30-35%, CAD, CABG, anemia, who presents with left arm pain due to AV fistula infection.  Pt had left upper arm AV graft placed on 04/23/2018. She developed steal symptoms and this was ligated on 03/01/2019.  She was seen by the nurse practitioner on 03/23/2019 with some slight infection at the site of her surgery.  She was receiving intravenous antibiotics during hemodialysis, with some improvement. She states that she her left arm pain has worsened in the past several days.  It is more swollen and erythematous.  The pain is constant, 9 out of 10 severity, sharp, nonradiating.  Denies fever or chills.  She has mild cough and mild shortness of breath, no chest pain. She has right femoral catheter placed since she had an occluded right IJ.  Patient was [facility] seen in [facility], and transferred here due to concerning for AV fistula infection. Pt also complains of her right jaw pain. Pt has elevated Bp 210/100. Patient was treatred NTG gtt for BP temporarily. Bp is 193/93 at arrival to floor.   Lab and test: INR 3.3, K 5.4 (repeated BMP at arrival,K 4.6), negative COVID-19. CXR shows bronchitis but no PNA. CT of left arm showed abscess around length of AV fistula.   Review of Systems:   General: no fevers, chills, no body weight gain, has fatigue HEENT: no blurry vision, hearing changes or  sore throat Respiratory: has dyspnea, coughing, no wheezing CV: no chest pain, no palpitations GI: no nausea, vomiting, abdominal pain, diarrhea, constipation GU: no dysuria, burning on urination, increased urinary frequency, hematuria  Ext: no leg edema. Has left arm swelling and pain Neuro: no unilateral weakness, numbness, or tingling, no vision change or hearing loss Skin: no rash, no skin tear. MSK: No muscle spasm, no deformity, no limitation of range of movement in spin Heme: No easy bruising.  Travel history: No recent long distant travel.  Allergy:  Allergies  Allergen Reactions  . Ace Inhibitors Anaphylaxis and Swelling  . Motrin Ib [Ibuprofen] Anaphylaxis    Past Medical History:  Diagnosis Date  . Anemia    low iron  . Anxiety   . Arthritis   . Bronchitis   . CHF (congestive heart failure) ([place])   . Coronary artery disease   . Depression   . Diabetes mellitus without complication ([place])   . Diet-controlled diabetes mellitus ([place])   . ESRD (end stage renal disease) ([place]) 03/16/2017   MWF- [facility]  . Ganglion cyst    left wrist  . GERD (gastroesophageal reflux disease)   . Headache    chronic headaches  . Heart failure ([place])   . Heart murmur   . Hyperlipidemia   . Hypertension   . Hypothyroidism   . Mitral regurgitation   . Myocardial infarction ([place])    3x last one 2008  . PAF (paroxysmal atrial fibrillation) ([place])   . Pneumonia  x 2  . S/P Maze operation for atrial fibrillation 02/18/2018   Complete bilateral atrial lesion set using bipolar radiofrequency and cryothermy ablation with clipping of LA appendage  . S/P mitral valve replacement with bioprosthetic valve 02/18/2018   [facility] Mitral stented bovine pericardial tissue valve Model 7300 TFX Serial # [id number] Size 29  . Stroke [facility])    no lasting residual - ? 2014  . Umbilical hernia     Past Surgical History:  Procedure Laterality Date  . ABDOMINAL HYSTERECTOMY     PARTIALS  . AV  FISTULA PLACEMENT Left 04/23/2018   Procedure: LEFT ARTERIOVENOUS (AV) GRAFT CREATION;  Surgeon: Marty Heck, MD;  Location: [place];  Service: Vascular;  Laterality: Left;  . BIV PACEMAKER INSERTION CRT-P N/A 02/26/2018   Procedure: BIV PACEMAKER INSERTION CRT-P;  Surgeon: Constance Haw, MD;  Location: [place] CV LAB;  Service: Cardiovascular;  Laterality: N/A;  . CARDIAC CATHETERIZATION    . CESAREAN SECTION    . COLONOSCOPY    . CORONARY ARTERY BYPASS GRAFT N/A 02/18/2018   Procedure: CORONARY ARTERY BYPASS GRAFTING (CABG) WITH IMA. ENDOSCOPIC VEIN HARVEST. IMA TO LAD, SVG TO CIRC.;  Surgeon: Rexene Alberts, MD;  Location: [place];  Service: Open Heart Surgery;  Laterality: N/A;  . INSERTION OF DIALYSIS CATHETER N/A 02/18/2018   Procedure: PLACEMENT OF TEMPORARY HD CATHETER, POWER TRIALYSIS 13FR 20CM;  Surgeon: Rexene Alberts, MD;  Location: [place];  Service: Vascular;  Laterality: N/A;  . INSERTION OF DIALYSIS CATHETER Right 04/23/2018   Procedure: INSERTION OF DIALYSIS CATHETER;  Surgeon: Marty Heck, MD;  Location: [place];  Service: Vascular;  Laterality: Right;  . INSERTION OF DIALYSIS CATHETER Right 03/01/2019   Procedure: INSERTION OF DIALYSIS CATHETER RIGHT Femoral;  Surgeon: Elam Dutch, MD;  Location: [place];  Service: Vascular;  Laterality: Right;  . IR NEPHRO TUBE REMOV/FL  04/02/2017  . IR NEPHROSTOGRAM RIGHT THRU EXISTING ACCESS  04/02/2017  . IR NEPHROSTOMY PLACEMENT RIGHT  03/27/2017  . IR THORACENTESIS ASP PLEURAL SPACE W/IMG GUIDE  03/24/2017  . LIGATION ARTERIOVENOUS GORTEX GRAFT Left 03/01/2019   Procedure: LIGATION ARTERIOVENOUS GORTEX GRAFT Left arm;  Surgeon: Elam Dutch, MD;  Location: [place];  Service: Vascular;  Laterality: Left;  Marland Kitchen MAZE N/A 02/18/2018   Procedure: MAZE;  Surgeon: Rexene Alberts, MD;  Location: [place];  Service: Open Heart Surgery;  Laterality: N/A;  . MITRAL VALVE REPLACEMENT N/A 02/18/2018   Procedure: MITRAL VALVE (MV)  REPLACEMENT;  Surgeon: Rexene Alberts, MD;  Location: [place];  Service: Open Heart Surgery;  Laterality: N/A;  . MULTIPLE EXTRACTIONS WITH ALVEOLOPLASTY N/A 11/05/2017   Procedure: Extraction of tooth #'s 4,5,7,8,9,10,12,14 and 29 with alveoloplasty and gross debridement of remaining teeth;  Surgeon: [name], DDS;  Location: [place];  Service: Oral Surgery;  Laterality: N/A;  . PATENT FORAMEN OVALE(PFO) CLOSURE N/A 02/18/2018   Procedure: PATENT FORAMEN OVALE (PFO) CLOSURE;  Surgeon: Rexene Alberts, MD;  Location: [place];  Service: Open Heart Surgery;  Laterality: N/A;  . RIGHT/LEFT HEART CATH AND CORONARY ANGIOGRAPHY N/A 07/03/2017   Procedure: RIGHT/LEFT HEART CATH AND CORONARY ANGIOGRAPHY;  Surgeon: Larey Dresser, MD;  Location: [place] CV LAB;  Service: Cardiovascular;  Laterality: N/A;  . TEE WITHOUT CARDIOVERSION N/A 10/08/2017   Procedure: TRANSESOPHAGEAL ECHOCARDIOGRAM (TEE);  Surgeon: Larey Dresser, MD;  Location: [facility] ENDOSCOPY;  Service: Cardiovascular;  Laterality: N/A;  . TEE WITHOUT CARDIOVERSION N/A  02/18/2018   Procedure: TRANSESOPHAGEAL ECHOCARDIOGRAM (TEE);  Surgeon: Rexene Alberts, MD;  Location: [place];  Service: Open Heart Surgery;  Laterality: N/A;  . TONSILLECTOMY    . TUBAL LIGATION      Social History:  reports that she quit smoking about 40 years ago. Her smoking use included cigarettes. She has never used smokeless tobacco. She reports previous drug use. She reports that she does not drink alcohol.  Family History:  Family History  Problem Relation Age of Onset  . Hypertension Mother      Prior to Admission medications   Medication Sig Start Date End Date Taking? Authorizing Provider  acetaminophen (TYLENOL) 500 MG tablet Take 1,500 mg by mouth every 6 (six) hours as needed for moderate pain or headache.    [provider]  albuterol (PROVENTIL HFA;VENTOLIN HFA) 108 (90 Base) MCG/ACT inhaler Inhale 2 puffs into the lungs every 6 (six) hours  as needed for wheezing or shortness of breath.    [provider]  ALPRAZolam Duanne Moron) 0.25 MG tablet Take 1 tablet (0.25 mg total) by mouth at bedtime as needed for anxiety. 04/16/18   Georgiana Shore, NP  amLODipine (NORVASC) 10 MG tablet Take 10 mg by mouth at bedtime.    [provider]  aspirin EC 81 MG EC tablet Take 1 tablet (81 mg total) by mouth daily. Patient taking differently: Take 81 mg by mouth at bedtime.  04/24/17   Allie Bossier, MD  atorvastatin (LIPITOR) 80 MG tablet Take 1 tablet (80 mg total) by mouth daily. Patient taking differently: Take 80 mg by mouth every evening.  03/06/18   Barrett, Erin R, PA-C  buPROPion (WELLBUTRIN) 100 MG tablet Take 100 mg by mouth 3 (three) times daily.    [provider]  busPIRone (BUSPAR) 15 MG tablet Take 1 tablet (15 mg total) by mouth 2 (two) times daily. 04/23/17   Allie Bossier, MD  carvedilol (COREG) 25 MG tablet Take 1 tablet (25 mg total) by mouth 2 (two) times daily. 05/01/18   Glenis Smoker, MD  cephALEXin (KEFLEX) 500 MG capsule Take 1 capsule (500 mg total) by mouth 2 (two) times daily. 03/23/19   Nickel, Sharmon Leyden, NP  cholecalciferol (VITAMIN D) 1000 units tablet Take 1,000 Units by mouth at bedtime.     [provider]  Cyanocobalamin (B-12) 2500 MCG TABS Take 2,500 mcg by mouth every evening.    [provider]  diphenhydrAMINE (BENADRYL) 25 MG tablet Take 50 mg by mouth daily as needed for allergies.    [provider]  doxazosin (CARDURA) 4 MG tablet Take 4 mg by mouth daily.     [provider]  doxazosin (CARDURA) 8 MG tablet Take 8 mg by mouth at bedtime.    [provider]  DULoxetine (CYMBALTA) 30 MG capsule Take 30 mg by mouth daily.    [provider]  gabapentin (NEURONTIN) 300 MG capsule Take 300 mg by mouth at bedtime.    [provider]  hydrALAZINE (APRESOLINE) 100 MG tablet Take 100 mg by mouth 3 (three) times daily.     [provider]  isosorbide mononitrate (IMDUR) 120 MG 24 hr tablet Take 1 tablet (120 mg total) by mouth daily. Patient taking differently: Take 120 mg by mouth at bedtime.  04/24/17   Allie Bossier, MD  levothyroxine (SYNTHROID) 150 MCG tablet Take 150 mcg by mouth daily before breakfast.    [provider]  lidocaine-prilocaine (  EMLA) cream Apply 1 application topically as needed (port access).    [provider]  montelukast (SINGULAIR) 10 MG tablet Take 1 tablet (10 mg total) by mouth daily. Patient taking differently: Take 10 mg by mouth at bedtime.  04/24/17   Allie Bossier, MD  multivitamin (RENA-VIT) TABS tablet Take 1 tablet by mouth at bedtime. 05/01/18   Glenis Smoker, MD  Naphazoline HCl (CLEAR EYES OP) Place 1 drop into both eyes daily as needed (dryness).    [provider]  nitroGLYCERIN (NITROSTAT) 0.4 MG SL tablet Place 0.4 mg under the tongue every 5 (five) minutes as needed for chest pain.    [provider]  Nutritional Supplements (FEEDING SUPPLEMENT, NEPRO CARB STEADY,) LIQD Take 237 mLs by mouth 3 (three) times daily between meals. 05/01/18   Glenis Smoker, MD  ondansetron (ZOFRAN) 4 MG tablet Take 4 mg by mouth every 6 (six) hours as needed for nausea or vomiting.    [provider]  pantoprazole (PROTONIX) 40 MG tablet Take 1 tablet (40 mg total) by mouth 2 (two) times daily before a meal. Patient taking differently: Take 40 mg by mouth daily.  04/23/17   Allie Bossier, MD  polyethylene glycol [facility] / Floria Raveling) packet Take 17 g by mouth daily. Patient taking differently: Take 17 g by mouth daily as needed for mild constipation.  05/02/18   Glenis Smoker, MD  tiZANidine (ZANAFLEX) 4 MG tablet Take 4 mg by mouth every 8 (eight) hours as needed for muscle spasms.    [provider]  traZODone (DESYREL) 100 MG tablet Take 100 mg by mouth at bedtime.    [provider]   warfarin (COUMADIN) 3 MG tablet Take 3 mg by mouth every evening.    [provider]    Physical Exam: Vitals:   04/12/19 2137 04/12/19 2228 04/13/19 0031 04/13/19 0408  BP: (!) 193/93 (!) 168/91 (!) 172/94 (!) 161/84  Pulse: 86 81 92 79  Resp: 18 17 13 15   Temp: 98 F (36.7 C)  98 F (36.7 C) 98.2 F (36.8 C)  TempSrc: Oral  Oral Oral  SpO2: 100% 97% 95% 95%  Weight: 54.4 kg   54 kg  Height: 5\' 9"  (1.753 m)      General: Not in acute distress HEENT:       Eyes: PERRL, EOMI, no scleral icterus.       ENT: No discharge from the ears and nose, no pharynx injection, no tonsillar enlargement.        Neck: No JVD, no bruit, no mass felt. Heme: No neck lymph node enlargement. Cardiac: S1/S2, RRR, No murmurs, No gallops or rubs. Respiratory:  No rales, wheezing, rhonchi or rubs. GI: Soft, nondistended, nontender, no rebound pain, no organomegaly, BS present. GU: No hematuria Ext: No pitting leg edema bilaterally. 2+DP/PT pulse bilaterally. Musculoskeletal: No joint deformities, No joint redness or warmth, no limitation of ROM in spin. Skin: has swelling, tenderness, warmth, erythematous around the AV fistula site in left arm. Neuro: Alert, oriented X3, cranial nerves II-XII grossly intact, moves all extremities normally. Psych: Patient is not psychotic, no suicidal or hemocidal ideation.  Labs on Admission: I have personally reviewed following labs and imaging studies  CBC: Recent Labs  Lab 04/12/19 2247  WBC 12.4*  HGB 12.1  HCT 35.4*  MCV 83.1  PLT [id number]   Basic Metabolic Panel: Recent Labs  Lab 04/12/19 2247  NA 135  K 4.6  CL  103  CO2 20*  GLUCOSE 101*  BUN 31*  CREATININE 4.18*  CALCIUM 9.6   GFR: Estimated Creatinine Clearance: 11.1 mL/min (A) (by C-G formula based on SCr of 4.18 mg/dL (H)). Liver Function Tests: No results for input(s): AST, ALT, ALKPHOS, BILITOT, PROT, ALBUMIN in the last 168 hours. No results for input(s): LIPASE, AMYLASE in  the last 168 hours. No results for input(s): AMMONIA in the last 168 hours. Coagulation Profile: Recent Labs  Lab 04/12/19 2247  INR 2.0*   Cardiac Enzymes: No results for input(s): CKTOTAL, CKMB, CKMBINDEX, TROPONINI in the last 168 hours. BNP (last 3 results) No results for input(s): PROBNP in the last 8760 hours. HbA1C: No results for input(s): HGBA1C in the last 72 hours. CBG: Recent Labs  Lab 04/13/19 0609  GLUCAP 98   Lipid Profile: No results for input(s): CHOL, HDL, LDLCALC, TRIG, CHOLHDL, LDLDIRECT in the last 72 hours. Thyroid Function Tests: No results for input(s): TSH, T4TOTAL, FREET4, T3FREE, THYROIDAB in the last 72 hours. Anemia Panel: No results for input(s): VITAMINB12, FOLATE, FERRITIN, TIBC, IRON, RETICCTPCT in the last 72 hours. Urine analysis:    Component Value Date/Time   COLORURINE YELLOW 02/17/2018 0007   APPEARANCEUR CLEAR 02/17/2018 0007   LABSPEC 1.006 02/17/2018 0007   PHURINE 7.0 02/17/2018 0007   GLUCOSEU NEGATIVE 02/17/2018 0007   HGBUR NEGATIVE 02/17/2018 0007   BILIRUBINUR NEGATIVE 02/17/2018 0007   KETONESUR NEGATIVE 02/17/2018 0007   PROTEINUR 100 (A) 02/17/2018 0007   NITRITE NEGATIVE 02/17/2018 0007   LEUKOCYTESUR NEGATIVE 02/17/2018 0007   Sepsis Labs: @LABRCNTIP (procalcitonin:4,lacticidven:4) ) Recent Results (from the past 240 hour(s))  Surgical pcr screen     Status: None   Collection Time: 04/13/19  4:06 AM   Specimen: Nasal Mucosa; Nasal Swab  Result Value Ref Range Status   MRSA, PCR NEGATIVE NEGATIVE Final   Staphylococcus aureus NEGATIVE NEGATIVE Final    Comment: (NOTE) The Xpert SA Assay (FDA approved for NASAL specimens in patients 69 years of age and older), is one component of a comprehensive surveillance program. It is not intended to diagnose infection nor to guide or monitor treatment. Performed at [facility], [place] [address] [place], [place] [address]      Radiological Exams on Admission: No  results found.   EKG: Independently reviewed.  Not done in ED, will get one.   Assessment/Plan Principal Problem:   AV fistula infection (HCC) Active Problems:   Hyperlipidemia, unspecified   Essential hypertension   Hypothyroidism   Chronic diastolic congestive heart failure (HCC)   Depression   Paroxysmal atrial fibrillation (HCC)   Chronic combined systolic (congestive) and diastolic (congestive) heart failure (HCC)   Anemia in chronic kidney disease   Anxiety   S/P CABG x 2   S/P mitral valve replacement with bioprosthetic valve + CABG x2 + maze procedure   S/P Maze operation for atrial fibrillation   ESRD on dialysis ([place])   Stroke ([place])   Type II diabetes mellitus with renal manifestations ([place])   CAD (coronary artery disease)   GERD (gastroesophageal reflux disease)   Cough   AV fistula infection ([place]): [facility], Dr. Tiajuana Amass was consulted, will need surgical debridement. INR 2.0. Dr. Scot Dock ordered 2 mg of vitamin K intravenously. -will admit to tele bed as inpt -Vancomycin (patient received Zosyn in [facility]) -Follow-up blood culture -Pain control: PRN morphine and Percocet -hold coumadin  Hyperlipidemia, unspecified: lipitro  Essential hypertension: -prn oral hydralazine -Continue home medications: Amlodipine, Cardura, hydralazine,  torsemide  Hypothyroidism: -Synthroid  Depression and anxiety: no SI or HI -Continue home medications  Paroxysmal atrial fibrillation [facility]): Atrial Fibrillation: CHA2DS2-VASc Score is 8, needs oral anticoagulation. Patient is on Coumadin. INR is 2.0 on admission. Heart rate is well controlled -hold coumadin as above  Chronic combined systolic (congestive) and diastolic (congestive) heart failure ([place]): 2D echo on 04/18/2018 showed EF of 30-35% with grade 2 diastolic dysfunction.  No leg edema or respiratory distress.  CHF symptoms compensated. -Volume management per renal  Anemia in chronic kidney disease: hgb stable,  13.0  CAD (coronary artery disease): s/p of CABG x 2. No CP -on ASA, liplitor, Imdur  Hx of stroke: -on ASA, lipitor  ESRD on dialysis (MWF): She has a functioning right femoral tunneled dialysis catheter which does not appear to be infected.  -please call renal for HD in AM.  Diet controled Type II diabetes mellitus with renal manifestations ([place]): -check blood surgar qAM  GERD (gastroesophageal reflux disease): -Protonix  Cough: Chest x-ray showed possible bronchitis -Patient is on antibiotics as above - PRN albuterol and Mucinex  Right jaw pain: No swelling or erythema.  Etiology is not clear. -Patient is on pain medication as above -Observe closely  S/P mitral valve replacement with bioprosthetic valve + CABG x2 + maze procedure  S/P Maze operation for atrial fibrillation    Inpatient status:  # Patient requires inpatient status due to high intensity of service, high risk for further deterioration and high frequency of surveillance required.  I certify that at the point of admission it is my clinical judgment that the patient will require inpatient hospital care spanning beyond 2 midnights from the point of admission.  . This patient has multiple chronic comorbidities including ESRD on dialysis (MWF), hypertension, hyperlipidemia, diet-controlled DM, stroke, GERD, hypothyroidism, depression with anxiety, mitral valve replacement with bioprosthetic valve, atrial fibrillation on Coumadin, sCHF with EF 30-35%, CAD, CABG, anemia . Now patient has presenting symptoms include left arm AV fistula infection . The worrisome physical exam findings include erythema, tenderness, warmth, swelling in left arm AV fistula area . The initial radiographic and laboratory data are worrisome because CT scan left arm showed abscess around length of AV fistula.  . Current medical needs: please see my assessment and plan . Predictability of an adverse outcome (risk): Patient has multiple  comorbidities which is very complicated, who presents with left arm AV fistula infection with abscess formation.  Patient will need surgical debridement.  Her presentation is highly complicated.  Given her very complicated medical history, patient is at high risk of deteriorating.  Will need to be treated in hospital for at least 2 days.      DVT ppx: SCD Code Status: Full code Family Communication: None at bed side.    Disposition Plan:  Anticipate discharge back to previous home environment Consults called:  Dr. Scot Dock of [facility] Admission status:  SDU/inpation       Date of Service 04/13/2019    [facility]   If 7PM-7AM, please contact night-coverage [email or web address] Password [email or web address] 04/13/2019, 7:40 AM

## 2019-04-12 NOTE — H&P (View-Only) (Signed)
REASON FOR CONSULT:    Infected left upper arm graft.  The consult is requested by Dr. Blaine Hamper  ASSESSMENT & PLAN:   INFECTED LEFT UPPER ARM GRAFT: This patient has an infected left upper arm graft which was ligated on 03/01/2019.  She is on Coumadin.  She has a functioning right femoral tunneled dialysis catheter which does not appear to be infected.  I have recommended removal of her left upper arm graft.  Her INR is 2.  I have written to give her 2 mg of vitamin K intravenously tonight with a follow-up pro time tomorrow morning.  I have discussed the indications and potential complications with the patient who is agreeable to proceed.  Preop orders have been written.  Deitra Mayo, MD, FACS Beeper [phone] Office: [phone]   HPI:   Jocelyn Hill is a pleasant 68 y.o. female, who had a left upper arm AV graft placed on 04/23/2018.  This was a 4-7 mm PTFE graft.  She developed steal symptoms and this was ligated on 03/01/2019.  She was seen by the nurse practitioner on 03/23/2019 with some slight infection at the site of her surgery.  She was receiving intravenous antibiotics during hemodialysis.   She was last seen by Dr. Oneida Alar on 04/07/2019.  The numbness in the left hand had improved.  She had an occluded right IJ pacemaker on the left side which is why she had a right femoral catheter placed according to the operative reports.  She was going to be considered for new access once the pain in her left arm had resolved.  It was felt that she would need a right sided central venogram prior placing new access in the right arm.  She presents tonight as a transfer from [facility] with increasing redness and swelling over her left upper arm graft.  Of note, she is on Coumadin for atrial fibrillation.  She dialyzes on Monday Wednesdays and Fridays.  She is right-hand dominant.  Past Medical History:  Diagnosis Date  . Anemia    low iron  . Anxiety   . Arthritis   . Bronchitis   .  CHF (congestive heart failure) ([place])   . Coronary artery disease   . Depression   . Diabetes mellitus without complication ([place])   . Diet-controlled diabetes mellitus ([place])   . ESRD (end stage renal disease) ([place]) 03/16/2017   MWF- [facility]  . Ganglion cyst    left wrist  . GERD (gastroesophageal reflux disease)   . Headache    chronic headaches  . Heart failure ([place])   . Heart murmur   . Hyperlipidemia   . Hypertension   . Hypothyroidism   . Mitral regurgitation   . Myocardial infarction ([place])    3x last one 2008  . PAF (paroxysmal atrial fibrillation) ([place])   . Pneumonia    x 2  . S/P Maze operation for atrial fibrillation 02/18/2018   Complete bilateral atrial lesion set using bipolar radiofrequency and cryothermy ablation with clipping of LA appendage  . S/P mitral valve replacement with bioprosthetic valve 02/18/2018   [facility] Mitral stented bovine pericardial tissue valve Model 7300 TFX Serial # [id number] Size 29  . Stroke [facility])    no lasting residual - ? 2014  . Umbilical hernia     Family History  Problem Relation Age of Onset  . Hypertension Mother     SOCIAL HISTORY: Social History   Socioeconomic History  . Marital status: Divorced  Spouse name: Not on file  . Number of children: Not on file  . Years of education: Not on file  . Highest education level: Not on file  Occupational History  . Not on file  Social Needs  . Financial resource strain: Not on file  . Food insecurity    Worry: Not on file    Inability: Not on file  . Transportation needs    Medical: Not on file    Non-medical: Not on file  Tobacco Use  . Smoking status: Former Smoker    Types: Cigarettes    Quit date: 1980    Years since quitting: 40.8  . Smokeless tobacco: Never Used  Substance and Sexual Activity  . Alcohol use: No  . Drug use: Not Currently    Comment: marijuana in the past  . Sexual activity: Not Currently  Lifestyle  . Physical activity    Days per week:  Not on file    Minutes per session: Not on file  . Stress: Not on file  Relationships  . Social Herbalist on phone: Not on file    Gets together: Not on file    Attends religious service: Not on file    Active member of club or organization: Not on file    Attends meetings of clubs or organizations: Not on file    Relationship status: Not on file  . Intimate partner violence    Fear of current or ex partner: Not on file    Emotionally abused: Not on file    Physically abused: Not on file    Forced sexual activity: Not on file  Other Topics Concern  . Not on file  Social History Narrative  . Not on file    Allergies  Allergen Reactions  . Ace Inhibitors Anaphylaxis and Swelling  . Motrin Ib [Ibuprofen] Anaphylaxis    Current Facility-Administered Medications  Medication Dose Route Frequency Provider Last Rate Last Dose  . acetaminophen (TYLENOL) tablet 650 mg  650 mg Oral Q6H PRN Ivor Costa, MD      . albuterol (PROVENTIL) (2.5 MG/3ML) 0.083% nebulizer solution 3 mL  3 mL Inhalation Q4H PRN Ivor Costa, MD      . ALPRAZolam Duanne Moron) tablet 0.25 mg  0.25 mg Oral QHS PRN Ivor Costa, MD      . Derrill Memo ON 04/13/2019] amLODipine (NORVASC) tablet 10 mg  10 mg Oral QHS Ivor Costa, MD      . Derrill Memo ON 04/13/2019] aspirin EC tablet 81 mg  81 mg Oral QHS Ivor Costa, MD      . Derrill Memo ON 04/13/2019] atorvastatin (LIPITOR) tablet 80 mg  80 mg Oral QPM Ivor Costa, MD      . Derrill Memo ON 04/13/2019] B-12 TABS 2,500 mcg  2,500 mcg Oral QPM Ivor Costa, MD      . Derrill Memo ON 04/13/2019] buPROPion [facility]) tablet 100 mg  100 mg Oral TID Ivor Costa, MD      . Derrill Memo ON 04/13/2019] busPIRone (BUSPAR) tablet 15 mg  15 mg Oral BID Ivor Costa, MD      . Derrill Memo ON 04/13/2019] carvedilol (COREG) tablet 6.25 mg  6.25 mg Oral BID Ivor Costa, MD      . dextromethorphan-guaiFENesin [employer] [facility] [employer] DM) 30-600 MG per 12 hr tablet 1 tablet  1 tablet Oral BID PRN Ivor Costa, MD      . Derrill Memo ON 04/13/2019]  doxazosin (CARDURA) tablet 8 mg  8 mg Oral QHS Ivor Costa, MD      . [  START ON 04/13/2019] DULoxetine (CYMBALTA) DR capsule 30 mg  30 mg Oral Daily Ivor Costa, MD      . Derrill Memo ON 04/13/2019] hydrALAZINE (APRESOLINE) tablet 100 mg  100 mg Oral TID Ivor Costa, MD      . hydrALAZINE (APRESOLINE) tablet 50 mg  50 mg Oral TID PRN Ivor Costa, MD      . Derrill Memo ON 04/13/2019] isosorbide mononitrate (IMDUR) 24 hr tablet 120 mg  120 mg Oral QHS Ivor Costa, MD      . Derrill Memo ON 04/13/2019] levothyroxine (SYNTHROID) tablet 150 mcg  150 mcg Oral QAC breakfast Ivor Costa, MD      . morphine 2 MG/ML injection 1 mg  1 mg Intravenous Q4H PRN Ivor Costa, MD   1 mg at 04/12/19 2306  . nitroGLYCERIN (NITROSTAT) SL tablet 0.4 mg  0.4 mg Sublingual Q5 min PRN Ivor Costa, MD      . ondansetron [facility]) injection 4 mg  4 mg Intravenous Q8H PRN Ivor Costa, MD      . oxyCODONE-acetaminophen (PERCOCET/ROXICET) 5-325 MG per tablet 1 tablet  1 tablet Oral Q4H PRN Ivor Costa, MD      . Derrill Memo ON 04/13/2019] pantoprazole (PROTONIX) EC tablet 40 mg  40 mg Oral Daily Ivor Costa, MD      . polyethylene glycol (MIRALAX / GLYCOLAX) packet 17 g  17 g Oral Daily PRN Ivor Costa, MD      . Derrill Memo ON 04/13/2019] torsemide (DEMADEX) tablet 100 mg  100 mg Oral Daily Ivor Costa, MD      . Derrill Memo ON 04/13/2019] traZODone (DESYREL) tablet 100 mg  100 mg Oral QHS Ivor Costa, MD        REVIEW OF SYSTEMS:  [X]  denotes positive finding, [ ]  denotes negative finding Cardiac  Comments:  Chest pain or chest pressure:    Shortness of breath upon exertion:    Short of breath when lying flat:    Irregular heart rhythm:        Vascular    Pain in calf, thigh, or hip brought on by ambulation:    Pain in feet at night that wakes you up from your sleep:     Blood clot in your veins:    Leg swelling:         Pulmonary    Oxygen at home:    Productive cough:     Wheezing:         Neurologic    Sudden weakness in arms or legs:     Sudden  numbness in arms or legs:     Sudden onset of difficulty speaking or slurred speech:    Temporary loss of vision in one eye:     Problems with dizziness:         Gastrointestinal    Blood in stool:     Vomited blood:         Genitourinary    Burning when urinating:     Blood in urine:        Psychiatric    Major depression:         Hematologic    Bleeding problems:    Problems with blood clotting too easily:        Skin    Rashes or ulcers: x  swelling and redness over left upper arm graft.      Constitutional    Fever or chills:     PHYSICAL EXAM:   Vitals:   04/12/19 2137  BP: (!) 193/93  Pulse: 86  Resp: 18  Temp: 98 F (36.7 C)  TempSrc: Oral  SpO2: 100%  Weight: 54.4 kg  Height: 5\' 9"  (1.753 m)    GENERAL: The patient is a well-nourished female, in no acute distress. The vital signs are documented above. CARDIAC: There is a regular rate and rhythm.  VASCULAR: Patient has a palpable left radial pulse. Her left upper arm graft has been ligated.  There is cellulitis and fluctuance over the midportion of the graft in the upper arm. PULMONARY: There is good air exchange bilaterally without wheezing or rales. ABDOMEN: Soft and non-tender with normal pitched bowel sounds.  MUSCULOSKELETAL: There are no major deformities or cyanosis. NEUROLOGIC: No focal weakness or paresthesias are detected. SKIN: There are no ulcers or rashes noted. PSYCHIATRIC: The patient has a normal affect.  DATA:    LABS: Potassium is 4.6.  White blood cell count 12.4.  Hemoglobin 12.1.  Platelets 217,000.  INR is 2.0.

## 2019-04-13 ENCOUNTER — Encounter (HOSPITAL_COMMUNITY): Payer: Self-pay | Admitting: *Deleted

## 2019-04-13 ENCOUNTER — Encounter (HOSPITAL_COMMUNITY)
Admission: AD | Disposition: A | Discharge status: Home Health | Payer: Self-pay | Source: Other Acute Inpatient Hospital | Attending: Internal Medicine

## 2019-04-13 ENCOUNTER — Inpatient Hospital Stay (HOSPITAL_COMMUNITY): Payer: Medicare Other | Admitting: Anesthesiology

## 2019-04-13 ENCOUNTER — Other Ambulatory Visit: Payer: Self-pay

## 2019-04-13 DIAGNOSIS — T82898A Other specified complication of vascular prosthetic devices, implants and grafts, initial encounter: Secondary | ICD-10-CM

## 2019-04-13 DIAGNOSIS — N186 End stage renal disease: Secondary | ICD-10-CM

## 2019-04-13 DIAGNOSIS — I2510 Atherosclerotic heart disease of native coronary artery without angina pectoris: Secondary | ICD-10-CM

## 2019-04-13 DIAGNOSIS — Z992 Dependence on renal dialysis: Secondary | ICD-10-CM

## 2019-04-13 HISTORY — PX: REMOVAL OF GRAFT: SHX6361

## 2019-04-13 LAB — BASIC METABOLIC PANEL
Anion gap: 12 (ref 5–15)
BUN: 32 mg/dL — ABNORMAL HIGH (ref 8–23)
CO2: 18 mmol/L — ABNORMAL LOW (ref 22–32)
Calcium: 9.2 mg/dL (ref 8.9–10.3)
Chloride: 107 mmol/L (ref 98–111)
Creatinine, Ser: 4.22 mg/dL — ABNORMAL HIGH (ref 0.44–1.00)
GFR calc Af Amer: 12 mL/min — ABNORMAL LOW (ref >60)
GFR calc non Af Amer: 10 mL/min — ABNORMAL LOW (ref >60)
Glucose, Bld: 94 mg/dL (ref 70–99)
Potassium: 4.5 mmol/L (ref 3.5–5.1)
Sodium: 137 mmol/L (ref 135–145)

## 2019-04-13 LAB — CBC
HCT: 32.2 % — ABNORMAL LOW (ref 36.0–46.0)
Hemoglobin: 11 g/dL — ABNORMAL LOW (ref 12.0–15.0)
MCH: 28.7 pg (ref 26.0–34.0)
MCHC: 34.2 g/dL (ref 30.0–36.0)
MCV: 84.1 fL (ref 80.0–100.0)
Platelets: 205 10*3/uL (ref 150–400)
RBC: 3.83 MIL/uL — ABNORMAL LOW (ref 3.87–5.11)
RDW: 14.2 % (ref 11.5–15.5)
WBC: 10.6 10*3/uL — ABNORMAL HIGH (ref 4.0–10.5)
nRBC: 0 % (ref 0.0–0.2)

## 2019-04-13 LAB — HIV ANTIBODY (ROUTINE TESTING W REFLEX): HIV Screen 4th Generation wRfx: NONREACTIVE

## 2019-04-13 LAB — C-REACTIVE PROTEIN: CRP: 3.4 mg/dL — ABNORMAL HIGH (ref <1.0)

## 2019-04-13 LAB — SURGICAL PCR SCREEN
MRSA, PCR: NEGATIVE
Staphylococcus aureus: NEGATIVE

## 2019-04-13 LAB — PROTIME-INR
INR: 1.7 — ABNORMAL HIGH (ref 0.8–1.2)
Prothrombin Time: 19.4 seconds — ABNORMAL HIGH (ref 11.4–15.2)

## 2019-04-13 LAB — VANCOMYCIN, RANDOM: Vancomycin Rm: 20

## 2019-04-13 LAB — GLUCOSE, CAPILLARY
Glucose-Capillary: 98 mg/dL (ref 70–99)
Glucose-Capillary: 97 mg/dL (ref 70–99)

## 2019-04-13 LAB — SEDIMENTATION RATE: Sed Rate: 17 mm/hr (ref 0–22)

## 2019-04-13 SURGERY — REMOVAL, GRAFT
Anesthesia: General | Site: Arm Upper | Laterality: Left

## 2019-04-13 MED ORDER — OXYCODONE HCL 5 MG PO TABS: 5.0000 mg | ORAL_TABLET | Freq: Once | ORAL | Status: DC | PRN

## 2019-04-13 MED ORDER — SODIUM CHLORIDE 0.9 % IV SOLN: 100.0000 mL | INTRAVENOUS | Status: DC | PRN

## 2019-04-13 MED ORDER — HEPARIN (PORCINE) 25000 UT/250ML-% IV SOLN
900.0000 [IU]/h | INTRAVENOUS | Status: DC
  Administered 2019-04-13: 750 [IU]/h via INTRAVENOUS
  Administered 2019-04-15: 800 [IU]/h via INTRAVENOUS
  Administered 2019-04-16 – 2019-04-20 (×4): 900 [IU]/h via INTRAVENOUS
  Filled 2019-04-13 (×6): qty 250

## 2019-04-13 MED ORDER — MIDAZOLAM HCL 2 MG/2ML IJ SOLN
INTRAMUSCULAR | Status: DC | PRN
  Administered 2019-04-13: 2 mg via INTRAVENOUS

## 2019-04-13 MED ORDER — CALCITRIOL 0.25 MCG PO CAPS
0.5000 ug | ORAL_CAPSULE | ORAL | Status: DC
  Administered 2019-04-13 – 2019-04-18 (×3): 0.5 ug via ORAL

## 2019-04-13 MED ORDER — PROPOFOL 10 MG/ML IV BOLUS
INTRAVENOUS | Status: DC | PRN
  Administered 2019-04-13: 100 mg via INTRAVENOUS

## 2019-04-13 MED ORDER — SODIUM CHLORIDE 0.9 % IV SOLN
INTRAVENOUS | Status: DC
  Administered 2019-04-13: 11:00:00 via INTRAVENOUS

## 2019-04-13 MED ORDER — MIDAZOLAM HCL 2 MG/2ML IJ SOLN
INTRAMUSCULAR | Status: AC
  Filled 2019-04-13: qty 2

## 2019-04-13 MED ORDER — PHENYLEPHRINE HCL (PRESSORS) 10 MG/ML IV SOLN
INTRAVENOUS | Status: DC | PRN
  Administered 2019-04-13: 80 ug via INTRAVENOUS

## 2019-04-13 MED ORDER — ALTEPLASE 2 MG IJ SOLR: 2.0000 mg | Freq: Once | INTRAMUSCULAR | Status: DC | PRN

## 2019-04-13 MED ORDER — 0.9 % SODIUM CHLORIDE (POUR BTL) OPTIME
TOPICAL | Status: DC | PRN
  Administered 2019-04-13: 1000 mL

## 2019-04-13 MED ORDER — HEPARIN SODIUM (PORCINE) 1000 UNIT/ML IJ SOLN
INTRAMUSCULAR | Status: AC
  Administered 2019-04-13: 6000 [IU] via INTRAVENOUS
  Filled 2019-04-13: qty 6

## 2019-04-13 MED ORDER — HEPARIN SODIUM (PORCINE) 1000 UNIT/ML DIALYSIS: 1000.0000 [IU] | INTRAMUSCULAR | Status: DC | PRN

## 2019-04-13 MED ORDER — VANCOMYCIN HCL IN DEXTROSE 500-5 MG/100ML-% IV SOLN
INTRAVENOUS | Status: AC
  Administered 2019-04-13: 500 mg via INTRAVENOUS
  Filled 2019-04-13: qty 100

## 2019-04-13 MED ORDER — CHLORHEXIDINE GLUCONATE CLOTH 2 % EX PADS: 6.0000 | MEDICATED_PAD | Freq: Every day | CUTANEOUS | Status: DC

## 2019-04-13 MED ORDER — SODIUM CHLORIDE 0.9 % IV SOLN
INTRAVENOUS | Status: AC
  Filled 2019-04-13: qty 1.2

## 2019-04-13 MED ORDER — OXYCODONE-ACETAMINOPHEN 5-325 MG PO TABS
ORAL_TABLET | ORAL | Status: AC
  Administered 2019-04-13: 1 via ORAL
  Filled 2019-04-13: qty 1

## 2019-04-13 MED ORDER — CALCITRIOL 0.5 MCG PO CAPS
ORAL_CAPSULE | ORAL | Status: AC
  Administered 2019-04-13: 0.5 ug via ORAL
  Filled 2019-04-13: qty 1

## 2019-04-13 MED ORDER — LIDOCAINE HCL (PF) 1 % IJ SOLN: 5.0000 mL | INTRAMUSCULAR | Status: DC | PRN

## 2019-04-13 MED ORDER — ONDANSETRON HCL 4 MG/2ML IJ SOLN: 4.0000 mg | Freq: Once | INTRAMUSCULAR | Status: DC | PRN

## 2019-04-13 MED ORDER — FENTANYL CITRATE (PF) 100 MCG/2ML IJ SOLN
INTRAMUSCULAR | Status: AC
  Filled 2019-04-13: qty 2

## 2019-04-13 MED ORDER — LIDOCAINE-PRILOCAINE 2.5-2.5 % EX CREA: 1.0000 "application " | TOPICAL_CREAM | CUTANEOUS | Status: DC | PRN

## 2019-04-13 MED ORDER — FENTANYL CITRATE (PF) 250 MCG/5ML IJ SOLN
INTRAMUSCULAR | Status: AC
  Filled 2019-04-13: qty 5

## 2019-04-13 MED ORDER — LIDOCAINE 2% (20 MG/ML) 5 ML SYRINGE
INTRAMUSCULAR | Status: DC | PRN
  Administered 2019-04-13: 60 mg via INTRAVENOUS

## 2019-04-13 MED ORDER — OXYCODONE HCL 5 MG/5ML PO SOLN: 5.0000 mg | Freq: Once | ORAL | Status: DC | PRN

## 2019-04-13 MED ORDER — FENTANYL CITRATE (PF) 100 MCG/2ML IJ SOLN
25.0000 ug | INTRAMUSCULAR | Status: DC | PRN
  Administered 2019-04-13: 50 ug via INTRAVENOUS
  Administered 2019-04-13 (×2): 25 ug via INTRAVENOUS

## 2019-04-13 MED ORDER — DEXAMETHASONE SODIUM PHOSPHATE 10 MG/ML IJ SOLN
INTRAMUSCULAR | Status: DC | PRN
  Administered 2019-04-13: 5 mg via INTRAVENOUS

## 2019-04-13 MED ORDER — FENTANYL CITRATE (PF) 250 MCG/5ML IJ SOLN
INTRAMUSCULAR | Status: DC | PRN
  Administered 2019-04-13 (×4): 25 ug via INTRAVENOUS
  Administered 2019-04-13: 50 ug via INTRAVENOUS
  Administered 2019-04-13 (×4): 25 ug via INTRAVENOUS

## 2019-04-13 MED ORDER — PENTAFLUOROPROP-TETRAFLUOROETH EX AERO: 1.0000 "application " | INHALATION_SPRAY | CUTANEOUS | Status: DC | PRN

## 2019-04-13 MED ORDER — SODIUM CHLORIDE 0.9 % IV SOLN
INTRAVENOUS | Status: DC | PRN
  Administered 2019-04-13: 500 mL

## 2019-04-13 MED ORDER — VANCOMYCIN HCL IN DEXTROSE 500-5 MG/100ML-% IV SOLN
500.0000 mg | INTRAVENOUS | Status: DC
  Administered 2019-04-13 – 2019-04-15 (×2): 500 mg via INTRAVENOUS
  Filled 2019-04-13 (×2): qty 100

## 2019-04-13 MED ORDER — HEPARIN SODIUM (PORCINE) 1000 UNIT/ML IJ SOLN
1000.0000 [IU] | INTRAMUSCULAR | Status: DC
  Administered 2019-04-13: 6000 [IU] via INTRAVENOUS

## 2019-04-13 SURGICAL SUPPLY — 35 items
ARMBAND PINK RESTRICT EXTREMIT (MISCELLANEOUS) ×4 IMPLANT
BNDG ELASTIC 4X5.8 VLCR STR LF (GAUZE/BANDAGES/DRESSINGS) ×1 IMPLANT
BNDG GAUZE ELAST 4 BULKY (GAUZE/BANDAGES/DRESSINGS) ×1 IMPLANT
CANISTER SUCT 3000ML PPV (MISCELLANEOUS) ×2 IMPLANT
CANNULA VESSEL 3MM 2 BLNT TIP (CANNULA) ×2 IMPLANT
CLIP LIGATING EXTRA MED SLVR (CLIP) ×2 IMPLANT
CLIP LIGATING EXTRA SM BLUE (MISCELLANEOUS) ×2 IMPLANT
COVER WAND RF STERILE (DRAPES) ×2 IMPLANT
DECANTER SPIKE VIAL GLASS SM (MISCELLANEOUS) ×2 IMPLANT
DERMABOND ADVANCED (GAUZE/BANDAGES/DRESSINGS) ×1
DERMABOND ADVANCED .7 DNX12 (GAUZE/BANDAGES/DRESSINGS) ×1 IMPLANT
DRAIN PENROSE 1/2X12 LTX STRL (WOUND CARE) ×1 IMPLANT
ELECT REM PT RETURN 9FT ADLT (ELECTROSURGICAL) ×2
ELECTRODE REM PT RTRN 9FT ADLT (ELECTROSURGICAL) ×1 IMPLANT
GAUZE SPONGE 4X4 12PLY STRL (GAUZE/BANDAGES/DRESSINGS) ×2 IMPLANT
GLOVE SS BIOGEL STRL SZ 7.5 (GLOVE) ×1 IMPLANT
GLOVE SUPERSENSE BIOGEL SZ 7.5 (GLOVE) ×1
GOWN STRL REUS W/ TWL LRG LVL3 (GOWN DISPOSABLE) ×3 IMPLANT
GOWN STRL REUS W/TWL LRG LVL3 (GOWN DISPOSABLE) ×3
KIT BASIN OR (CUSTOM PROCEDURE TRAY) ×2 IMPLANT
KIT TURNOVER KIT B (KITS) ×2 IMPLANT
NS IRRIG 1000ML POUR BTL (IV SOLUTION) ×2 IMPLANT
PACK CV ACCESS (CUSTOM PROCEDURE TRAY) ×2 IMPLANT
PAD ARMBOARD 7.5X6 YLW CONV (MISCELLANEOUS) ×4 IMPLANT
SUT ETHILON 3 0 PS 1 (SUTURE) ×3 IMPLANT
SUT PROLENE 6 0 CC (SUTURE) ×7 IMPLANT
SUT SILK 2 0 PERMA HAND 18 BK (SUTURE) IMPLANT
SUT VIC AB 3-0 SH 27 (SUTURE) ×5
SUT VIC AB 3-0 SH 27X BRD (SUTURE) ×2 IMPLANT
SWAB COLLECTION DEVICE MRSA (MISCELLANEOUS) ×1 IMPLANT
SWAB CULTURE ESWAB REG 1ML (MISCELLANEOUS) ×1 IMPLANT
SYR TOOMEY 50ML (SYRINGE) IMPLANT
TOWEL GREEN STERILE (TOWEL DISPOSABLE) ×2 IMPLANT
UNDERPAD 30X30 (UNDERPADS AND DIAPERS) ×2 IMPLANT
WATER STERILE IRR 1000ML POUR (IV SOLUTION) ×2 IMPLANT

## 2019-04-13 NOTE — Progress Notes (Signed)
Pt walked to the bathroom, started draining form her left UE infected AV fistula. IT was warm, red, swollen and painful on arrival seemed like it was going to drain anytime. It drained moderate amount of serosanguineous drainage mixed with purulent drainage and some black exudate. Drain was continuous, site cleaned with normal saline. Covered with wet to dry 4x4, abdominal pad and kurlex. IV morphine administered. Possible I and D per vascular surgery today. Will continue  to monitor.

## 2019-04-13 NOTE — Progress Notes (Signed)
Received patient to room 4e09 from [facility], patient altert and oriented with complaints of arm pain. Tele monitor applied and CCMD notified. CHG bath given. Admitting notified. Oriented to room and call bell within reach.   Tawanna Sat, RN

## 2019-04-13 NOTE — Interval H&P Note (Signed)
History and Physical Interval Note:  04/13/2019 11:06 AM  Jocelyn Hill  has presented today for surgery, with the diagnosis of infected left avg.  The various methods of treatment have been discussed with the patient and family. After consideration of risks, benefits and other options for treatment, the patient has consented to  Procedure(s): REMOVAL OF LEFT (AVG) GRAFT (Left) as a surgical intervention.  The patient's history has been reviewed, patient examined, no change in status, stable for surgery.  I have reviewed the patient's chart and labs.  Questions were answered to the patient's satisfaction.     Curt Jews

## 2019-04-13 NOTE — Anesthesia Procedure Notes (Signed)
Procedure Name: LMA Insertion Date/Time: 04/13/2019 11:42 AM Performed by: Clearnce Sorrel, CRNA Pre-anesthesia Checklist: Patient identified, Emergency Drugs available, Suction available, Patient being monitored and Timeout performed Patient Re-evaluated:Patient Re-evaluated prior to induction Oxygen Delivery Method: Circle system utilized Preoxygenation: Pre-oxygenation with 100% oxygen Induction Type: IV induction LMA: LMA inserted LMA Size: 4.0 Number of attempts: 1 Placement Confirmation: positive ETCO2 and breath sounds checked- equal and bilateral Tube secured with: Tape Dental Injury: Teeth and Oropharynx as per pre-operative assessment

## 2019-04-13 NOTE — Progress Notes (Signed)
St Jude pacer in place. Anesthesia requesting pacer be interrogated prior to surgery today. St Jude rep George notified. Stated she would be here in 15-20 minutes.

## 2019-04-13 NOTE — Transfer of Care (Signed)
Immediate Anesthesia Transfer of Care Note  Patient: Jocelyn Hill  Procedure(s) Performed: REMOVAL OF INFECTED GRAFT LEFT UPPER ARM WITH VEIN PATCH OF LEFT BRACHIAL ARTERY (Left Arm Upper)  Patient Location: PACU  Anesthesia Type:General  Level of Consciousness: awake, alert  and oriented  Airway & Oxygen Therapy: Patient Spontanous Breathing  Post-op Assessment: Report given to RN and Post -op Vital signs reviewed and stable  Post vital signs: Reviewed and stable  Last Vitals:  Vitals Value Taken Time  BP 162/96 04/13/19 1346  Temp 36.2 C 04/13/19 1345  Pulse 80 04/13/19 1352  Resp 12 04/13/19 1352  SpO2 95 % 04/13/19 1352  Vitals shown include unvalidated device data.  Last Pain:  Vitals:   04/13/19 1345  TempSrc:   PainSc: 9          Complications: No apparent anesthesia complications

## 2019-04-13 NOTE — Progress Notes (Signed)
Pharmacy Antibiotic Note  Jocelyn Hill is a 68 y.o. female admitted on 04/12/2019 with infected left upper arm graft.  Pharmacy has been consulted for Vancomycin dosing. WBC is mildly elevated.   Pt has ESRD on HD MWF  She has been appropriately loaded/treated with vancomycin at an outside facility, her random vancomycin level tonight is 20 (goal 15-25)   Plan: -No need for additional vancomycin tonight with a random level of 20 -Start vancomycin 500 mg IV qHD MWF -Repeat levels as needed  -Likely OR today  Height: 5\' 9"  (175.3 cm) Weight: 120 lb (54.4 kg) IBW/kg (Calculated) : 66.2  Temp (24hrs), Avg:98 F (36.7 C), Min:98 F (36.7 C), Max:98 F (36.7 C)  Recent Labs  Lab 04/12/19 2247 04/13/19 0007  WBC 12.4*  --   CREATININE 4.18*  --   VANCORANDOM  --  20    Estimated Creatinine Clearance: 11.2 mL/min (A) (by C-G formula based on SCr of 4.18 mg/dL (H)).    Allergies  Allergen Reactions  . Ace Inhibitors Anaphylaxis and Swelling  . Motrin Ib [Ibuprofen] Anaphylaxis   Narda Bonds, PharmD, BCPS Clinical Pharmacist Phone: [phone]

## 2019-04-13 NOTE — Progress Notes (Signed)
Pt went to dialysis at 2100. All hypertensive meds held and nighttime meds will be administered when pt returns. Dressing on L upper arm surgical site reinforced to prevent bleeding. Will continue to monitor. Lajoyce Corners, RN

## 2019-04-13 NOTE — Op Note (Signed)
° ° °  OPERATIVE REPORT  DATE OF SURGERY: 04/13/2019  PATIENT: Jocelyn Hill, 68 y.o. female MRN: [id number]  DOB: 26-Jul-1950  PRE-OPERATIVE DIAGNOSIS: Infected left upper arm AV Gore-Tex graft  POST-OPERATIVE DIAGNOSIS:  Same  PROCEDURE: Removal of infected left upper arm AV Gore-Tex graft and vein patch of left brachial artery  SURGEON:  Curt Jews, M.D.  PHYSICIAN ASSISTANT: Liana Crocker, PA-C  ANESTHESIA: General  EBL: per anesthesia record  Total I/O In: 400 [I.V.:400] Out: 50 [Blood:50]  BLOOD ADMINISTERED: none  DRAINS: none  SPECIMEN: none  COUNTS CORRECT:  YES  PATIENT DISPOSITION:  PACU - hemodynamically stable  PROCEDURE DETAILS: Patient was taken to the operating placed supine position where the area of the left arm and left axilla were prepped and draped in usual sterile fashion.  The patient had her left upper arm graft ligated several weeks prior due to steal.  She presented with erythema and purulence in the upper arm away from the incision.  An incision was made over the prior brachial artery anastomosis.  There was no evidence of purulence at the site.  The brachial artery was of normal caliber and was exposed proximal and distal to the Gore-Tex anastomosis to the brachial artery.  Next a separate incision was made over the mid upper arm the area of fluctuance and there was frank pus around the graft.  This was cultured and sent for aerobic and anaerobic culture.  Finally an incision was made at the axilla at the prior Gore-Tex graft axillary vein anastomosis.  The vein was of large caliber and was patent.  The vein was occluded proximally and distally and the Gore-Tex graft was taken off the vein and a portion of this vein was removed for a vein patch to the brachial artery.  The vein was oversewn longitudinally with a 5-0 Prolene suture but did maintain patency.  The Gore-Tex graft was removed in its entirety throughout the arm.  The brachial artery was  occluded above and below the arterial anastomosis.  The arterial anastomosis was taken down and all Gore-Tex graft was removed.  The vein patch was brought onto the field and was cut to the appropriate size and was sewn as a vein patch angioplasty over the arterial anastomosis.  Clamps were removed and additional suture was required for hemostasis.  The patient maintained a 3+ radial pulse.  The wounds were irrigated with saline.  The area in the mid upper arm was debrided where the purulence was present.  The track of the Gore-Tex graft was flushed with saline.  A one half Penrose drain was brought through the tract of the remove it Gore-Tex.  The axillary and brachial antecubital incision were closed with 3-0 nylon mattress sutures.  The area in the mid upper arm was partially closed and the end was drain was brought out through this incision and was secured to the skin with a 3-0 nylon stitch.  Sterile dressing Curlex and Ace wrap were applied.  Transfer to the recovery in stable condition   Rosetta Posner, M.D., [facility] 04/13/2019 2:03 PM

## 2019-04-13 NOTE — Anesthesia Postprocedure Evaluation (Signed)
Anesthesia Post Note  Patient: Jocelyn Hill  Procedure(s) Performed: REMOVAL OF INFECTED GRAFT LEFT UPPER ARM WITH VEIN PATCH OF LEFT BRACHIAL ARTERY (Left Arm Upper)     Patient location during evaluation: PACU Anesthesia Type: General Level of consciousness: awake and alert Pain management: pain level controlled Vital Signs Assessment: post-procedure vital signs reviewed and stable Respiratory status: spontaneous breathing, nonlabored ventilation and respiratory function stable Cardiovascular status: blood pressure returned to baseline and stable Postop Assessment: no apparent nausea or vomiting Anesthetic complications: no    Last Vitals:  Vitals:   04/13/19 1430 04/13/19 1440  BP: (!) 146/81 (!) 149/88  Pulse: 82 80  Resp: 12 15  Temp: (!) 36.1 C 36.8 C  SpO2: 95% 93%    Last Pain:  Vitals:   04/13/19 1440  TempSrc: Oral  PainSc:                  Audry Pili

## 2019-04-13 NOTE — Anesthesia Preprocedure Evaluation (Addendum)
Anesthesia Evaluation  Patient identified by MRN, date of birth, ID band Patient awake    Reviewed: Allergy & Precautions, NPO status , Patient's Chart, lab work & pertinent test results  History of Anesthesia Complications Negative for: history of anesthetic complications  Airway Mallampati: III  TM Distance: >3 FB Neck ROM: Full    Dental  (+) Dental Advisory Given, Missing   Pulmonary former smoker,    Pulmonary exam normal        Cardiovascular hypertension, Pt. on medications and Pt. on home beta blockers + CAD, + Past MI, + CABG and +CHF  Normal cardiovascular exam+ dysrhythmias Atrial Fibrillation + Valvular Problems/Murmurs    '19 TTE - Severe concentric hypertrophy. EF 30% to 35%. Grade 2 diastolic dysfunction. S/P MVR with a bioprosthetic valve. Mild PR. A trivial pericardial effusion was identified posterior to the heart. Features were not consistent with tamponade physiology. There was a large left pleural effusion.    Neuro/Psych  Headaches, PSYCHIATRIC DISORDERS Anxiety Depression CVA, No Residual Symptoms    GI/Hepatic Neg liver ROS, GERD  Medicated,  Endo/Other  diabetes, Type 2Hypothyroidism   Renal/GU ESRF and DialysisRenal disease     Musculoskeletal  (+) Arthritis ,   Abdominal   Peds  Hematology  (+) anemia ,   Anesthesia Other Findings   Reproductive/Obstetrics                           Anesthesia Physical Anesthesia Plan  ASA: IV  Anesthesia Plan: General   Post-op Pain Management:    Induction: Intravenous  PONV Risk Score and Plan: 3 and Treatment may vary due to age or medical condition, Ondansetron and Dexamethasone  Airway Management Planned: LMA  Additional Equipment: None  Intra-op Plan:   Post-operative Plan: Extubation in OR  Informed Consent: I have reviewed the patients History and Physical, chart, labs and discussed the procedure including  the risks, benefits and alternatives for the proposed anesthesia with the patient or authorized representative who has indicated his/her understanding and acceptance.     Dental advisory given  Plan Discussed with: CRNA and Anesthesiologist  Anesthesia Plan Comments:       Anesthesia Quick Evaluation

## 2019-04-13 NOTE — Consult Note (Signed)
Referring Provider: No ref. provider found Primary Care Physician:  [name], MD Primary Nephrologist:  Dr. Johnney Ou  Reason for Consultation: Management of end-stage renal disease, maintenance of euvolemia, correction of anemia and treatment of secondary hyperparathyroidism  HPI: This is a pleasant six 68-year-old lady she receives dialysis as per kidney center Monday Wednesday Friday.  She has a history of diabetes, coronary artery disease status post CABG, congestive heart failure EF 30 to 35% and hypertension, prosthetic mitral valve replacement, status post maze procedure for atrial fibrillation.  She has a right IJ pacemaker on the left and a right femoral dialysis catheter.  She was admitted 04/12/2019 with redness and swelling over the left upper arm graft.  The left upper arm graft appears to been placed on 04/23/2018 she developed steal syndrome which necessitated ligation 03/01/2019.  Slight infection was noted 03/23/2019 and she was placed on antibiotics.  Medications amlodipine 10 mg daily Rena-Vite 1 daily, doxazosin 8 mg in the morning 4 mg in evening, gabapentin 300 mg daily, aspirin 81 mg daily atorvastatin 80 mg daily BuSpar 15 mg twice daily, carvedilol 25 mg twice daily, cholecalciferol 25 mcg daily, duloxetine 30 mg daily, hydralazine 100 mg 3 times daily, isosorbide 120 mg daily, levothyroxine 175 mcg once daily, Singulair 10 mg daily, Protonix 40 mg daily, Coumadin 3 mg daily, trazodone 100 mg nightly, alprazolam 0.25 mg at bedtime,  Blood pressure 161/84 pulse 77 temperature 98.2 O2 sats 95% room air  Sodium 135 potassium 4.6 chloride 103 CO2 20 BUN 31 creatinine 4.18 glucose 101 calcium 9.6 WBC 12.4 hemoglobin 12.1 platelets 217 INR 2.0    Past Medical History:  Diagnosis Date  . Anemia    low iron  . Anxiety   . Arthritis   . Bronchitis   . CHF (congestive heart failure) ([place])   . Coronary artery disease   . Depression   . Diabetes mellitus without complication  ([place])   . Diet-controlled diabetes mellitus ([place])   . ESRD (end stage renal disease) ([place]) 03/16/2017   MWF- [facility]  . Ganglion cyst    left wrist  . GERD (gastroesophageal reflux disease)   . Headache    chronic headaches  . Heart failure ([place])   . Heart murmur   . Hyperlipidemia   . Hypertension   . Hypothyroidism   . Mitral regurgitation   . Myocardial infarction ([place])    3x last one 2008  . PAF (paroxysmal atrial fibrillation) ([place])   . Pneumonia    x 2  . S/P Maze operation for atrial fibrillation 02/18/2018   Complete bilateral atrial lesion set using bipolar radiofrequency and cryothermy ablation with clipping of LA appendage  . S/P mitral valve replacement with bioprosthetic valve 02/18/2018   [facility] Mitral stented bovine pericardial tissue valve Model 7300 TFX Serial # [id number] Size 29  . Stroke [facility])    no lasting residual - ? 2014  . Umbilical hernia     Past Surgical History:  Procedure Laterality Date  . ABDOMINAL HYSTERECTOMY     PARTIALS  . AV FISTULA PLACEMENT Left 04/23/2018   Procedure: LEFT ARTERIOVENOUS (AV) GRAFT CREATION;  Surgeon: Marty Heck, MD;  Location: [place];  Service: Vascular;  Laterality: Left;  . BIV PACEMAKER INSERTION CRT-P N/A 02/26/2018   Procedure: BIV PACEMAKER INSERTION CRT-P;  Surgeon: Constance Haw, MD;  Location: [place] CV LAB;  Service: Cardiovascular;  Laterality: N/A;  . CARDIAC CATHETERIZATION    . CESAREAN SECTION    .  COLONOSCOPY    . CORONARY ARTERY BYPASS GRAFT N/A 02/18/2018   Procedure: CORONARY ARTERY BYPASS GRAFTING (CABG) WITH IMA. ENDOSCOPIC VEIN HARVEST. IMA TO LAD, SVG TO CIRC.;  Surgeon: Rexene Alberts, MD;  Location: [place];  Service: Open Heart Surgery;  Laterality: N/A;  . INSERTION OF DIALYSIS CATHETER N/A 02/18/2018   Procedure: PLACEMENT OF TEMPORARY HD CATHETER, POWER TRIALYSIS 13FR 20CM;  Surgeon: Rexene Alberts, MD;  Location: [place];  Service: Vascular;  Laterality: N/A;  .  INSERTION OF DIALYSIS CATHETER Right 04/23/2018   Procedure: INSERTION OF DIALYSIS CATHETER;  Surgeon: Marty Heck, MD;  Location: [place];  Service: Vascular;  Laterality: Right;  . INSERTION OF DIALYSIS CATHETER Right 03/01/2019   Procedure: INSERTION OF DIALYSIS CATHETER RIGHT Femoral;  Surgeon: Elam Dutch, MD;  Location: [place];  Service: Vascular;  Laterality: Right;  . IR NEPHRO TUBE REMOV/FL  04/02/2017  . IR NEPHROSTOGRAM RIGHT THRU EXISTING ACCESS  04/02/2017  . IR NEPHROSTOMY PLACEMENT RIGHT  03/27/2017  . IR THORACENTESIS ASP PLEURAL SPACE W/IMG GUIDE  03/24/2017  . LIGATION ARTERIOVENOUS GORTEX GRAFT Left 03/01/2019   Procedure: LIGATION ARTERIOVENOUS GORTEX GRAFT Left arm;  Surgeon: Elam Dutch, MD;  Location: [place];  Service: Vascular;  Laterality: Left;  Marland Kitchen MAZE N/A 02/18/2018   Procedure: MAZE;  Surgeon: Rexene Alberts, MD;  Location: [place];  Service: Open Heart Surgery;  Laterality: N/A;  . MITRAL VALVE REPLACEMENT N/A 02/18/2018   Procedure: MITRAL VALVE (MV) REPLACEMENT;  Surgeon: Rexene Alberts, MD;  Location: [place];  Service: Open Heart Surgery;  Laterality: N/A;  . MULTIPLE EXTRACTIONS WITH ALVEOLOPLASTY N/A 11/05/2017   Procedure: Extraction of tooth #'s 4,5,7,8,9,10,12,14 and 29 with alveoloplasty and gross debridement of remaining teeth;  Surgeon: [name], DDS;  Location: [place];  Service: Oral Surgery;  Laterality: N/A;  . PATENT FORAMEN OVALE(PFO) CLOSURE N/A 02/18/2018   Procedure: PATENT FORAMEN OVALE (PFO) CLOSURE;  Surgeon: Rexene Alberts, MD;  Location: [place];  Service: Open Heart Surgery;  Laterality: N/A;  . RIGHT/LEFT HEART CATH AND CORONARY ANGIOGRAPHY N/A 07/03/2017   Procedure: RIGHT/LEFT HEART CATH AND CORONARY ANGIOGRAPHY;  Surgeon: Larey Dresser, MD;  Location: [place] CV LAB;  Service: Cardiovascular;  Laterality: N/A;  . TEE WITHOUT CARDIOVERSION N/A 10/08/2017   Procedure: TRANSESOPHAGEAL ECHOCARDIOGRAM (TEE);  Surgeon: Larey Dresser, MD;  Location: [facility] ENDOSCOPY;  Service: Cardiovascular;  Laterality: N/A;  . TEE WITHOUT CARDIOVERSION N/A 02/18/2018   Procedure: TRANSESOPHAGEAL ECHOCARDIOGRAM (TEE);  Surgeon: Rexene Alberts, MD;  Location: [place];  Service: Open Heart Surgery;  Laterality: N/A;  . TONSILLECTOMY    . TUBAL LIGATION      Prior to Admission medications   Medication Sig Start Date End Date Taking? Authorizing Provider  acetaminophen (TYLENOL) 500 MG tablet Take 1,500 mg by mouth every 6 (six) hours as needed for moderate pain or headache.    [provider]  albuterol (PROVENTIL HFA;VENTOLIN HFA) 108 (90 Base) MCG/ACT inhaler Inhale 2 puffs into the lungs every 6 (six) hours as needed for wheezing or shortness of breath.    [provider]  ALPRAZolam Duanne Moron) 0.25 MG tablet Take 1 tablet (0.25 mg total) by mouth at bedtime as needed for anxiety. 04/16/18   Georgiana Shore, NP  amLODipine (NORVASC) 10 MG tablet Take 10 mg by mouth at bedtime.    [provider]  aspirin EC 81 MG EC tablet Take 1 tablet (  81 mg total) by mouth daily. Patient taking differently: Take 81 mg by mouth at bedtime.  04/24/17   Allie Bossier, MD  atorvastatin (LIPITOR) 80 MG tablet Take 1 tablet (80 mg total) by mouth daily. Patient taking differently: Take 80 mg by mouth every evening.  03/06/18   Barrett, Erin R, PA-C  buPROPion (WELLBUTRIN) 100 MG tablet Take 100 mg by mouth 3 (three) times daily.    [provider]  busPIRone (BUSPAR) 15 MG tablet Take 1 tablet (15 mg total) by mouth 2 (two) times daily. 04/23/17   Allie Bossier, MD  carvedilol (COREG) 25 MG tablet Take 1 tablet (25 mg total) by mouth 2 (two) times daily. 05/01/18   Glenis Smoker, MD  cephALEXin (KEFLEX) 500 MG capsule Take 1 capsule (500 mg total) by mouth 2 (two) times daily. 03/23/19   Nickel, Sharmon Leyden, NP  cholecalciferol (VITAMIN D) 1000 units tablet Take 1,000 Units by mouth at bedtime.     [provider]  Cyanocobalamin (B-12) 2500 MCG TABS Take 2,500 mcg by mouth every evening.    [provider]  diphenhydrAMINE (BENADRYL) 25 MG tablet Take 50 mg by mouth daily as needed for allergies.    [provider]  doxazosin (CARDURA) 4 MG tablet Take 4 mg by mouth daily.     [provider]  doxazosin (CARDURA) 8 MG tablet Take 8 mg by mouth at bedtime.    [provider]  DULoxetine (CYMBALTA) 30 MG capsule Take 30 mg by mouth daily.    [provider]  gabapentin (NEURONTIN) 300 MG capsule Take 300 mg by mouth at bedtime.    [provider]  hydrALAZINE (APRESOLINE) 100 MG tablet Take 100 mg by mouth 3 (three) times daily.    [provider]  isosorbide mononitrate (IMDUR) 60 MG 24 hr tablet Take 120 mg by mouth daily. 03/21/19   [provider]  levothyroxine (SYNTHROID) 150 MCG tablet Take 150 mcg by mouth daily before breakfast.    [provider]  lidocaine-prilocaine (EMLA) cream Apply 1 application topically as needed (port access).    [provider]  montelukast (SINGULAIR) 10 MG tablet Take 1 tablet (10 mg total) by mouth daily. Patient taking differently: Take 10 mg by mouth at bedtime.  04/24/17   Allie Bossier, MD  multivitamin (RENA-VIT) TABS tablet Take 1 tablet by mouth at bedtime. 05/01/18   Glenis Smoker, MD  Naphazoline HCl (CLEAR EYES OP) Place 1 drop into both eyes daily as needed (dryness).    [provider]  nitroGLYCERIN (NITROSTAT) 0.4 MG SL tablet Place 0.4 mg under the tongue every 5 (five) minutes as needed for chest pain.    [provider]  Nutritional Supplements (FEEDING SUPPLEMENT, NEPRO CARB STEADY,) LIQD Take 237 mLs by mouth 3 (three) times daily between meals. 05/01/18   Glenis Smoker, MD  ondansetron (ZOFRAN) 4 MG tablet Take 4 mg by mouth every 6 (six) hours as needed for nausea or vomiting.    [provider]   pantoprazole (PROTONIX) 40 MG tablet Take 1 tablet (40 mg total) by mouth 2 (two) times daily before a meal. Patient taking differently: Take 40 mg by mouth daily.  04/23/17   Allie Bossier, MD  polyethylene glycol [facility] Floria Raveling) packet Take 17 g by mouth daily. Patient taking differently: Take 17 g by mouth daily as needed for mild constipation.  05/02/18   Glenis Smoker,  MD  tiZANidine (ZANAFLEX) 4 MG tablet Take 4 mg by mouth every 8 (eight) hours as needed for muscle spasms.    [provider]  traZODone (DESYREL) 100 MG tablet Take 100 mg by mouth at bedtime.    [provider]  warfarin (COUMADIN) 3 MG tablet Take 3 mg by mouth every evening.    [provider]    Current Facility-Administered Medications  Medication Dose Route Frequency Provider Last Rate Last Dose  . acetaminophen (TYLENOL) tablet 650 mg  650 mg Oral Q6H PRN Ivor Costa, MD      . albuterol (PROVENTIL) (2.5 MG/3ML) 0.083% nebulizer solution 3 mL  3 mL Inhalation Q4H PRN Ivor Costa, MD      . ALPRAZolam Duanne Moron) tablet 0.25 mg  0.25 mg Oral QHS PRN Ivor Costa, MD      . amLODipine (NORVASC) tablet 10 mg  10 mg Oral QHS Ivor Costa, MD   10 mg at 04/13/19 0034  . aspirin EC tablet 81 mg  81 mg Oral QHS Ivor Costa, MD   81 mg at 04/13/19 0034  . atorvastatin (LIPITOR) tablet 80 mg  80 mg Oral QPM Ivor Costa, MD   80 mg at 04/13/19 0033  . buPROPion (WELLBUTRIN) tablet 100 mg  100 mg Oral TID Ivor Costa, MD      . busPIRone (BUSPAR) tablet 15 mg  15 mg Oral BID Ivor Costa, MD      . carvedilol (COREG) tablet 6.25 mg  6.25 mg Oral BID Ivor Costa, MD   6.25 mg at 04/13/19 0032  . Chlorhexidine Gluconate Cloth 2 % PADS 6 each  6 each Topical Daily Ivor Costa, MD      . dextromethorphan-guaiFENesin [facility] DM) 30-600 MG per 12 hr tablet 1 tablet  1 tablet Oral BID PRN Ivor Costa, MD      . doxazosin (CARDURA) tablet 8 mg  8 mg Oral QHS Ivor Costa, MD   8 mg at 04/13/19 0031  .  DULoxetine (CYMBALTA) DR capsule 30 mg  30 mg Oral Daily Ivor Costa, MD      . hydrALAZINE (APRESOLINE) tablet 100 mg  100 mg Oral TID Ivor Costa, MD   100 mg at 04/13/19 0033  . hydrALAZINE (APRESOLINE) tablet 50 mg  50 mg Oral TID PRN Ivor Costa, MD      . isosorbide mononitrate (IMDUR) 24 hr tablet 120 mg  120 mg Oral QHS Ivor Costa, MD   120 mg at 04/13/19 0032  . levothyroxine (SYNTHROID) tablet 150 mcg  150 mcg Oral QAC breakfast Ivor Costa, MD   150 mcg at 04/13/19 0617  . morphine 2 MG/ML injection 1 mg  1 mg Intravenous Q4H PRN Ivor Costa, MD   1 mg at 04/13/19 0425  . nitroGLYCERIN (NITROSTAT) SL tablet 0.4 mg  0.4 mg Sublingual Q5 min PRN Ivor Costa, MD      . ondansetron [facility]) injection 4 mg  4 mg Intravenous Q8H PRN Ivor Costa, MD      . oxyCODONE-acetaminophen (PERCOCET/ROXICET) 5-325 MG per tablet 1 tablet  1 tablet Oral Q4H PRN Ivor Costa, MD   1 tablet at 04/13/19 0617  . pantoprazole (PROTONIX) EC tablet 40 mg  40 mg Oral Daily Ivor Costa, MD      . polyethylene glycol (MIRALAX / GLYCOLAX) packet 17 g  17 g Oral Daily PRN Ivor Costa, MD      . torsemide [facility]) tablet 100 mg  100 mg Oral Daily Niu,  Soledad Gerlach, MD      . traZODone (DESYREL) tablet 100 mg  100 mg Oral QHS Ivor Costa, MD   100 mg at 04/13/19 0033  . vancomycin (VANCOCIN) IVPB 500 mg/100 ml premix  500 mg Intravenous Q M,W,F-HD Erenest Blank, RPH      . vitamin B-12 (CYANOCOBALAMIN) tablet 2,500 mcg  2,500 mcg Oral QPM Ivor Costa, MD        Allergies as of 04/12/2019 - Review Complete 04/12/2019  Allergen Reaction Noted  . Ace inhibitors Anaphylaxis and Swelling 03/12/2017  . Motrin ib [ibuprofen] Anaphylaxis 10/08/2017    Family History  Problem Relation Age of Onset  . Hypertension Mother     Social History   Socioeconomic History  . Marital status: Divorced    Spouse name: Not on file  . Number of children: Not on file  . Years of education: Not on file  . Highest education level: Not on file   Occupational History  . Not on file  Social Needs  . Financial resource strain: Not on file  . Food insecurity    Worry: Not on file    Inability: Not on file  . Transportation needs    Medical: Not on file    Non-medical: Not on file  Tobacco Use  . Smoking status: Former Smoker    Types: Cigarettes    Quit date: 1980    Years since quitting: 40.8  . Smokeless tobacco: Never Used  Substance and Sexual Activity  . Alcohol use: No  . Drug use: Not Currently    Comment: marijuana in the past  . Sexual activity: Not Currently  Lifestyle  . Physical activity    Days per week: Not on file    Minutes per session: Not on file  . Stress: Not on file  Relationships  . Social Herbalist on phone: Not on file    Gets together: Not on file    Attends religious service: Not on file    Active member of club or organization: Not on file    Attends meetings of clubs or organizations: Not on file    Relationship status: Not on file  . Intimate partner violence    Fear of current or ex partner: Not on file    Emotionally abused: Not on file    Physically abused: Not on file    Forced sexual activity: Not on file  Other Topics Concern  . Not on file  Social History Narrative  . Not on file    Review of Systems: Gen: Denies any fever, chills, sweats, anorexia, fatigue, weakness, malaise, weight loss, and sleep disorder HEENT: No visual complaints, No history of Retinopathy. Normal external appearance No Epistaxis or Sore throat. No sinusitis.   CV: Denies chest pain, angina, palpitations, syncope, orthopnea, PND, peripheral edema, and claudication. Resp: Denies dyspnea at rest, dyspnea with exercise, cough, sputum, wheezing, coughing up blood, and pleurisy. GI: Denies vomiting blood, jaundice, and fecal incontinence.   Denies dysphagia or odynophagia. GU : Denies urinary burning, blood in urine, urinary frequency, urinary hesitancy, nocturnal urination, and urinary  incontinence.  No renal calculi. MS pain and discomfort with a bandaged left arm Derm: Denies rash, itching, dry skin, hives, moles, warts, or unhealing ulcers.  Psych: Denies depression, anxiety, memory loss, suicidal ideation, hallucinations, paranoia, and confusion. Heme: Denies bruising, bleeding, and enlarged lymph nodes. Neuro: No headache.  No diplopia. No dysarthria.  No dysphasia.  No history of CVA.  No Seizures. No paresthesias.  No weakness. Endocrine diabetes mellitus and hypothyroidism    Physical Exam: Vital signs in last 24 hours: Temp:  [98 F (36.7 C)-98.2 F (36.8 C)] 98.2 F (36.8 C) (10/14 0408) Pulse Rate:  [79-92] 79 (10/14 0408) Resp:  [13-18] 15 (10/14 0408) BP: (161-193)/(84-94) 161/84 (10/14 0408) SpO2:  [95 %-100 %] 95 % (10/14 0408) Weight:  [54 kg-54.4 kg] 54 kg (10/14 0408)   General:   Alert,  Well-developed, well-nourished, pleasant and cooperative in NAD Head:  Normocephalic and atraumatic. Eyes:  Sclera clear, no icterus.   Conjunctiva pink. Ears:  Normal auditory acuity. Nose:  No deformity, discharge,  or lesions. Mouth:  No deformity or lesions, dentition normal. Neck:  Supple; no masses or thyromegaly. JVP not elevated Lungs:  Clear throughout to auscultation.   No wheezes, crackles, or rhonchi. No acute distress. Heart:  Regular rate and rhythm; no murmurs, clicks, rubs,  or gallops. Abdomen:  Soft, nontender and nondistended. No masses, hepatosplenomegaly or hernias noted. Normal bowel sounds, without guarding, and without rebound.   Msk:  Symmetrical without gross deformities. Normal posture. Pulses:  No carotid, renal, femoral bruits. DP and PT symmetrical and equal Extremities: Bandage left upper extremity Neurologic:  Alert and  oriented x4;  grossly normal neurologically. Skin:  Intact without significant lesions or rashes. Cervical Nodes:  No significant cervical adenopathy. Psych:  Alert and cooperative. Normal mood and  affect.  Intake/Output from previous day: 10/13 0701 - 10/14 0700 In: 290 [P.O.:240; IV Piggyback:50] Out: -  Intake/Output this shift: No intake/output data recorded.  Lab Results: Recent Labs    04/12/19 2247  WBC 12.4*  HGB 12.1  HCT 35.4*  PLT 217   BMET Recent Labs    04/12/19 2247  NA 135  K 4.6  CL 103  CO2 20*  GLUCOSE 101*  BUN 31*  CREATININE 4.18*  CALCIUM 9.6   LFT No results for input(s): PROT, ALBUMIN, AST, ALT, ALKPHOS, BILITOT, BILIDIR, IBILI in the last 72 hours. PT/INR Recent Labs    04/12/19 2247  LABPROT 22.2*  INR 2.0*   Hepatitis Panel No results for input(s): HEPBSAG, HCVAB, HEPAIGM, HEPBIGM in the last 72 hours.  Studies/Results: No results found.  Assessment/Plan:  ESRD-Monday Wednesday Friday dialysis.  We will plan dialysis 04/13/2019.  She has a thigh catheter.  This is accessible and can be used.  Anemia last hemoglobin 11.2 04/08/2019 no issue in the hospital mycera 30 mcg every 4 weeks  Bones oral calcitriol 0.5 mcg 3 times a week  Infected AV fistula.  Status post ligation IV vancomycin 500 mg Monday Wednesday Friday appreciate assistance of Dr. Scot Dock  Coronary artery disease status post CABG continue aspirin and Lipitor  History of atrial fibrillation anticoagulated with Coumadin given vitamin K preop  Hypothyroidism using Synthroid  Diabetes mellitus diet controlled  Gastroesophageal reflux disease currently on Protonix  Status post mitral valve replacement with prosthetic mitral valve on anticoagulation   LOS: [place] @TODAY @8 :39 AM

## 2019-04-13 NOTE — Plan of Care (Signed)
Plan of Care progressing

## 2019-04-13 NOTE — Progress Notes (Signed)
ANTICOAGULATION CONSULT NOTE - Initial Consult  Pharmacy Consult for Heparin Indication: atrial fibrillation  Allergies  Allergen Reactions  . Ace Inhibitors Anaphylaxis and Swelling  . Motrin Ib [Ibuprofen] Anaphylaxis    Patient Measurements: Height: 5\' 9"  (175.3 cm) Weight: 119 lb 0.8 oz (54 kg) IBW/kg (Calculated) : 66.2 Heparin Dosing Weight: 54kg  Vital Signs: Temp: 98.3 F (36.8 C) (10/14 1440) Temp Source: Oral (10/14 1440) BP: 149/88 (10/14 1440) Pulse Rate: 80 (10/14 1440)  Labs: Recent Labs    04/12/19 2247 04/13/19 0849  HGB 12.1 11.0*  HCT 35.4* 32.2*  PLT 217 205  APTT 39*  --   LABPROT 22.2* 19.4*  INR 2.0* 1.7*  CREATININE 4.18* 4.22*    Estimated Creatinine Clearance: 11 mL/min (A) (by C-G formula based on SCr of 4.22 mg/dL (H)).   Medical History: Past Medical History:  Diagnosis Date  . Anemia    low iron  . Anxiety   . Arthritis   . Bronchitis   . CHF (congestive heart failure) ([place])   . Coronary artery disease   . Depression   . Diabetes mellitus without complication ([place])   . Diet-controlled diabetes mellitus ([place])   . ESRD (end stage renal disease) ([place]) 03/16/2017   MWF- [facility] Alert  . Ganglion cyst    left wrist  . GERD (gastroesophageal reflux disease)   . Headache    chronic headaches  . Heart failure ([place])   . Heart murmur   . Hyperlipidemia   . Hypertension   . Hypothyroidism   . Mitral regurgitation   . Myocardial infarction ([place])    3x last one 2008  . PAF (paroxysmal atrial fibrillation) ([place])   . Pneumonia    x 2  . S/P Maze operation for atrial fibrillation 02/18/2018   Complete bilateral atrial lesion set using bipolar radiofrequency and cryothermy ablation with clipping of LA appendage  . S/P mitral valve replacement with bioprosthetic valve 02/18/2018   [facility] Mitral stented bovine pericardial tissue valve Model 7300 TFX Serial # [id number] Size 29  . Stroke [facility])    no lasting residual - ? 2014  .  Umbilical hernia     Assessment: Anticoag: Coumadin PTA for afib and mitral valve replacement with bioprosthetic mitral valve . INR 1.7 today. IV heparin to start post-op. - 10/14 IU:[id number]) Vit K 2mg  IV - PTA Coumadin 3mg  daily with admit INR 2  Goal of Therapy:  Hep level 0.3-0.5 for a day post-op Monitor platelets by anticoagulation protocol: Yes   Plan:  Start IV heparin (no bolus post-op) at 750 units/hr Check heparin level in 6 hrs. Daily HL and CBC   Terren Haberle S. Alford Highland, PharmD, [place] Clinical Staff Pharmacist Eilene Ghazi [place] 04/13/2019,3:36 PM

## 2019-04-13 NOTE — Progress Notes (Signed)
Patient will need to be interrogated in PACU post procedure per Dr. Fransisco Beau.  [place], Dionne Milo is aware and stated that PACU can use transmitter.

## 2019-04-13 NOTE — Progress Notes (Addendum)
PROGRESS NOTE        PATIENT DETAILS Name: Jocelyn Hill Age: 68 y.o. Sex: female Date of Birth: [birth year] Admit Date: 04/12/2019 Admitting Physician Ivor Costa, MD [id number], Venia Carbon, MD  Brief Narrative: Patient is a 68 y.o. female with history of ESRD on HD MWF, recent AV fistula infection-apparently on IV antibiotics with HD, mitral valve replacement with bioprosthetic valve and maze procedure in [id number], chronic systolic heart failure, CAD s/p CABG-who was transferred from [facility] for evaluation of left arm AV fistula infection.  Subjective: Continues to have pain in the left arm area-n.p.o. for vascular surgery procedure later today.  Assessment/Plan: Left upper extremity AV fistula infection: Continue vancomycin-evaluated by vascular surgery for OR later today.Blood cultures 10/13-pending  Chronic combined systolic/diastolic heart failure (EF 30-35% by TTE on October 2019): Volume status stable-diuresis with HD.  PAF: Rate controlled-INR is 1.7  as patient is s/p Coumadin 2 mg x 1 on 10/13.  Needs to be started on anticoagulation post OR-probably will require IV heparin until INR is therapeutic.  Addendum 3 pm Spoke with Dr Shary Key to start heparin today-if tolerates well-ok to resume coumadin on 10/15. No further vascular procedures planned.  History of mitral valve replacement with bioprosthetic valve in 2019  CAD s/p CABG in 2019: No anginal symptoms.  Continue aspirin, statin and beta-blocker.  Hypertension: Controlled-continue hydralazine, Cardura, Coreg and Imdur.  ESRD-HD MWF: Nephrology consulted-defer dialysis to nephrology  Anemia: Appears mild-likely related to ESRD-follow periodically.  Hypothyroidism: Continue with levothyroxine  GERD: Continue PPI  Diet: Diet Order            Diet NPO time specified Except for: Sips with Meds  Diet effective now               DVT Prophylaxis: SCD's  Code Status: Full  code   Family Communication: None at bedside  Disposition Plan: Remain inpatient  Antimicrobial agents: Anti-infectives (From admission, onward)   Start     Dose/Rate Route Frequency Ordered Stop   04/13/19 1200  [MAR Hold]  vancomycin (VANCOCIN) IVPB 500 mg/100 ml premix     (MAR Hold since Wed 04/13/2019 at 1037.Hold Reason: Transfer to a Procedural area.)   500 mg 100 mL/hr over 60 Minutes Intravenous Every M-W-F (Hemodialysis) 04/13/19 0128        Procedures: None  CONSULTS:  nephrology and vascular surgery  Time spent: 25- minutes-Greater than 50% of this time was spent in counseling, explanation of diagnosis, planning of further management, and coordination of care.  MEDICATIONS: Scheduled Meds: . [MAR Hold] amLODipine  10 mg Oral QHS  . [MAR Hold] aspirin EC  81 mg Oral QHS  . [MAR Hold] atorvastatin  80 mg Oral QPM  . [MAR Hold] buPROPion  100 mg Oral TID  . [MAR Hold] busPIRone  15 mg Oral BID  . [MAR Hold] calcitRIOL  0.5 mcg Oral Q M,W,F-HD  . [MAR Hold] carvedilol  6.25 mg Oral BID  . [MAR Hold] Chlorhexidine Gluconate Cloth  6 each Topical Daily  . [MAR Hold] doxazosin  8 mg Oral QHS  . [MAR Hold] DULoxetine  30 mg Oral Daily  . [MAR Hold] hydrALAZINE  100 mg Oral TID  . [MAR Hold] isosorbide mononitrate  120 mg Oral QHS  . [MAR Hold] levothyroxine  150 mcg Oral QAC breakfast  . [MAR Hold]  pantoprazole  40 mg Oral Daily  . [MAR Hold] torsemide  100 mg Oral Daily  . [MAR Hold] traZODone  100 mg Oral QHS  . [MAR Hold] vancomycin  500 mg Intravenous Q M,W,F-HD  . [MAR Hold] vitamin B-12  2,500 mcg Oral QPM   Continuous Infusions: . sodium chloride 10 mL/hr at 04/13/19 1043   PRN Meds:.[MAR Hold] acetaminophen, [MAR Hold] albuterol, [MAR Hold] ALPRAZolam, [MAR Hold] dextromethorphan-guaiFENesin, [MAR Hold] hydrALAZINE, [MAR Hold]  morphine injection, [MAR Hold] nitroGLYCERIN, [MAR Hold] ondansetron (ZOFRAN) IV, [MAR Hold] oxyCODONE-acetaminophen, [MAR Hold]  polyethylene glycol   PHYSICAL EXAM: Vital signs: Vitals:   04/12/19 2228 04/13/19 0031 04/13/19 0408 04/13/19 1044  BP: (!) 168/91 (!) 172/94 (!) 161/84   Pulse: 81 92 79   Resp: 17 13 15    Temp:  98 F (36.7 C) 98.2 F (36.8 C)   TempSrc:  Oral Oral   SpO2: 97% 95% 95%   Weight:   54 kg 54 kg  Height:    5\' 9"  (1.753 m)   Filed Weights   04/12/19 2137 04/13/19 0408 04/13/19 1044  Weight: 54.4 kg 54 kg 54 kg   Body mass index is 17.58 kg/m.   Gen Exam:Alert awake-not in any distress HEENT:atraumatic, normocephalic Chest: B/L clear to auscultation anteriorly CVS:S1S2 regular Abdomen:soft non tender, non distended Extremities:no edema Neurology: Non focal Skin: no rash  I have personally reviewed following labs and imaging studies  LABORATORY DATA: CBC: Recent Labs  Lab 04/12/19 2247 04/13/19 0849  WBC 12.4* 10.6*  HGB 12.1 11.0*  HCT 35.4* 32.2*  MCV 83.1 84.1  PLT 217 [id number]    Basic Metabolic Panel: Recent Labs  Lab 04/12/19 2247 04/13/19 0849  NA 135 137  K 4.6 4.5  CL 103 107  CO2 20* 18*  GLUCOSE 101* 94  BUN 31* 32*  CREATININE 4.18* 4.22*  CALCIUM 9.6 9.2    GFR: Estimated Creatinine Clearance: 11 mL/min (A) (by C-G formula based on SCr of 4.22 mg/dL (H)).  Liver Function Tests: No results for input(s): AST, ALT, ALKPHOS, BILITOT, PROT, ALBUMIN in the last 168 hours. No results for input(s): LIPASE, AMYLASE in the last 168 hours. No results for input(s): AMMONIA in the last 168 hours.  Coagulation Profile: Recent Labs  Lab 04/12/19 2247 04/13/19 0849  INR 2.0* 1.7*    Cardiac Enzymes: No results for input(s): CKTOTAL, CKMB, CKMBINDEX, TROPONINI in the last 168 hours.  BNP (last 3 results) No results for input(s): PROBNP in the last 8760 hours.  HbA1C: No results for input(s): HGBA1C in the last 72 hours.  CBG: Recent Labs  Lab 04/13/19 0609  GLUCAP 98    Lipid Profile: No results for input(s): CHOL, HDL, LDLCALC,  TRIG, CHOLHDL, LDLDIRECT in the last 72 hours.  Thyroid Function Tests: No results for input(s): TSH, T4TOTAL, FREET4, T3FREE, THYROIDAB in the last 72 hours.  Anemia Panel: No results for input(s): VITAMINB12, FOLATE, FERRITIN, TIBC, IRON, RETICCTPCT in the last 72 hours.  Urine analysis:    Component Value Date/Time   COLORURINE YELLOW 02/17/2018 0007   APPEARANCEUR CLEAR 02/17/2018 0007   LABSPEC 1.006 02/17/2018 0007   PHURINE 7.0 02/17/2018 0007   GLUCOSEU NEGATIVE 02/17/2018 0007   HGBUR NEGATIVE 02/17/2018 0007   BILIRUBINUR NEGATIVE 02/17/2018 0007   KETONESUR NEGATIVE 02/17/2018 0007   PROTEINUR 100 (A) 02/17/2018 0007   NITRITE NEGATIVE 02/17/2018 0007   LEUKOCYTESUR NEGATIVE 02/17/2018 0007    Sepsis Labs: Lactic Acid, Venous  Component Value Date/Time   LATICACIDVEN 1.5 03/26/2017 1432    MICROBIOLOGY: Recent Results (from the past 240 hour(s))  Surgical pcr screen     Status: None   Collection Time: 04/13/19  4:06 AM   Specimen: Nasal Mucosa; Nasal Swab  Result Value Ref Range Status   MRSA, PCR NEGATIVE NEGATIVE Final   Staphylococcus aureus NEGATIVE NEGATIVE Final    Comment: (NOTE) The Xpert SA Assay (FDA approved for NASAL specimens in patients 11 years of age and older), is one component of a comprehensive surveillance program. It is not intended to diagnose infection nor to guide or monitor treatment. Performed at [facility], [place] [address] [place], [place] [address]     RADIOLOGY STUDIES/RESULTS: No results found.   LOS: 1 day   Oren Binet, MD  [facility]  If 7PM-7AM, please contact night-coverage  Please page via [email or web address]  Go to [email or web address] and use [facility] universal password to access. If you do not have the password, please contact the hospital operator.  Locate the [facility] provider you are looking for under [facility] and page to a number that you can be directly reached. If you still have  difficulty reaching the provider, please page the [facility] (Director on Call) for the Hospitalists listed on amion for assistance.  04/13/2019, 11:02 AM

## 2019-04-14 ENCOUNTER — Encounter (HOSPITAL_COMMUNITY): Payer: Self-pay | Admitting: Vascular Surgery

## 2019-04-14 DIAGNOSIS — Z992 Dependence on renal dialysis: Secondary | ICD-10-CM

## 2019-04-14 DIAGNOSIS — N186 End stage renal disease: Secondary | ICD-10-CM

## 2019-04-14 LAB — RENAL FUNCTION PANEL
Albumin: 3.7 g/dL (ref 3.5–5.0)
Anion gap: 13 (ref 5–15)
BUN: 14 mg/dL (ref 8–23)
CO2: 22 mmol/L (ref 22–32)
Calcium: 8.8 mg/dL — ABNORMAL LOW (ref 8.9–10.3)
Chloride: 100 mmol/L (ref 98–111)
Creatinine, Ser: 2.56 mg/dL — ABNORMAL HIGH (ref 0.44–1.00)
GFR calc Af Amer: 22 mL/min — ABNORMAL LOW (ref >60)
GFR calc non Af Amer: 19 mL/min — ABNORMAL LOW (ref >60)
Glucose, Bld: 98 mg/dL (ref 70–99)
Phosphorus: 3.3 mg/dL (ref 2.5–4.6)
Potassium: 3.7 mmol/L (ref 3.5–5.1)
Sodium: 135 mmol/L (ref 135–145)

## 2019-04-14 LAB — CBC
HCT: 32 % — ABNORMAL LOW (ref 36.0–46.0)
Hemoglobin: 11.4 g/dL — ABNORMAL LOW (ref 12.0–15.0)
MCH: 28.9 pg (ref 26.0–34.0)
MCHC: 35.6 g/dL (ref 30.0–36.0)
MCV: 81 fL (ref 80.0–100.0)
Platelets: 241 10*3/uL (ref 150–400)
RBC: 3.95 MIL/uL (ref 3.87–5.11)
RDW: 13.8 % (ref 11.5–15.5)
WBC: 14.2 10*3/uL — ABNORMAL HIGH (ref 4.0–10.5)
nRBC: 0 % (ref 0.0–0.2)

## 2019-04-14 LAB — HEPARIN LEVEL (UNFRACTIONATED)
Heparin Unfractionated: 1.01 IU/mL — ABNORMAL HIGH (ref 0.30–0.70)
Heparin Unfractionated: 0.88 IU/mL — ABNORMAL HIGH (ref 0.30–0.70)
Heparin Unfractionated: <0.1 IU/mL — ABNORMAL LOW (ref 0.30–0.70)
Heparin Unfractionated: <0.1 IU/mL — ABNORMAL LOW (ref 0.30–0.70)

## 2019-04-14 LAB — GLUCOSE, CAPILLARY: Glucose-Capillary: 92 mg/dL (ref 70–99)

## 2019-04-14 MED ORDER — RENA-VITE PO TABS
1.0000 | ORAL_TABLET | Freq: Every day | ORAL | Status: DC
  Administered 2019-04-14 – 2019-04-19 (×6): 1 via ORAL
  Filled 2019-04-14 (×6): qty 1

## 2019-04-14 MED ORDER — CHLORHEXIDINE GLUCONATE CLOTH 2 % EX PADS
6.0000 | MEDICATED_PAD | Freq: Every day | CUTANEOUS | Status: DC
  Administered 2019-04-14 – 2019-04-18 (×4): 6 via TOPICAL

## 2019-04-14 MED ORDER — HEPARIN SODIUM (PORCINE) 1000 UNIT/ML IJ SOLN
1000.0000 [IU] | INTRAMUSCULAR | Status: DC
  Administered 2019-04-18: 1000 [IU]
  Filled 2019-04-14 (×3): qty 1

## 2019-04-14 MED ORDER — PANTOPRAZOLE SODIUM 40 MG PO TBEC
40.0000 mg | DELAYED_RELEASE_TABLET | Freq: Every day | ORAL | Status: DC
  Administered 2019-04-14 – 2019-04-20 (×8): 40 mg via ORAL
  Filled 2019-04-14 (×7): qty 1

## 2019-04-14 MED ORDER — NEPRO/CARBSTEADY PO LIQD
237.0000 mL | Freq: Three times a day (TID) | ORAL | Status: DC
  Administered 2019-04-15 – 2019-04-19 (×7): 237 mL via ORAL
  Filled 2019-04-14 (×2): qty 237

## 2019-04-14 NOTE — Progress Notes (Signed)
ANTICOAGULATION CONSULT NOTE - Follow Up Consult  Pharmacy Consult for Heparin Indication: atrial fibrillation  Allergies  Allergen Reactions  . Ace Inhibitors Anaphylaxis and Swelling  . Motrin Ib [Ibuprofen] Anaphylaxis    Patient Measurements: Height: 5\' 9"  (175.3 cm) Weight: 115 lb 8 oz (52.4 kg) IBW/kg (Calculated) : 66.2 Heparin Dosing Weight: 52.4 kg  Vital Signs: Temp: 98.2 F (36.8 C) (10/15 2025) Temp Source: Oral (10/15 2025) BP: 140/79 (10/15 2025) Pulse Rate: 91 (10/15 2025)  Labs: Recent Labs    04/12/19 2247 04/13/19 0849  04/14/19 0330 04/14/19 1406 04/14/19 2113  HGB 12.1 11.0*  --  11.4*  --   --   HCT 35.4* 32.2*  --  32.0*  --   --   PLT 217 205  --  241  --   --   APTT 39*  --   --   --   --   --   LABPROT 22.2* 19.4*  --   --   --   --   INR 2.0* 1.7*  --   --   --   --   HEPARINUNFRC  --   --    < > 0.88* <0.10* <0.10*  CREATININE 4.18* 4.22*  --  2.56*  --   --    < > = values in this interval not displayed.    Estimated Creatinine Clearance: 17.6 mL/min (A) (by C-G formula based on SCr of 2.56 mg/dL (H)).  Assessment:  Anticoag: Coumadin PTA for afib and mitral valve replacement with bioprosthetic mitral valve . INR 1.7 on 10/14. IV heparin to start post-op.  Heparin level has fluctuated likely due to inability to get accurate blood draw.  Heparin is infusing in right arm and left arm is restricted.  Goal of Therapy:  Heparin level 0.3-0.5 units/ml Monitor platelets by anticoagulation protocol: Yes   Plan:  Increase IV heparin to 800 units/hr May need order for foot stick in am if heparin continues Daily HL and CBC  Anayansi Rundquist Poteet 04/14/2019,10:58 PM

## 2019-04-14 NOTE — Progress Notes (Signed)
Initial Nutrition Assessment  DOCUMENTATION CODES:   Non-severe (moderate) malnutrition in context of chronic illness, Underweight  INTERVENTION:    Nepro Shake po TID, each supplement provides 425 kcal and 19 grams protein  MVI daily   NUTRITION DIAGNOSIS:   Moderate Malnutrition related to chronic illness(ESRD/HD) as evidenced by percent weight loss, energy intake < 75% for > or equal to 1 month.  GOAL:   Patient will meet greater than or equal to 90% of their needs  MONITOR:   PO intake, Supplement acceptance, Weight trends, Labs, I & O's  REASON FOR ASSESSMENT:   Malnutrition Screening Tool    ASSESSMENT:   Patient with PMH significant for ESRD on HD, s/p MVR, CHF, DM, and CAD s/p CABG. Presents this admission with L AVF infection.   10/14- removal of L AFV  RD working remotely.   Spoke with pt via phone. Reports having decreased appetite over the last year for unknown reason. States during this time she would consume 1 meal on HD days (consisted of meat, vegetable, grain/potatoes) and 2 meals on non-HD days (same meal composition for both meals). She does not eat breakfast.  Sometimes drinks Nepro at HD. She vomited with breakfast this am. Discussed the importance of protein intake for preservation of lean body mass. Pt amendable to Nepro once nausea improves.   Pt endorses a EDW of 57 kg and reports outpatient has adjusted this consistently over the year due to ongoing weight loss. Records indicate pt weighed 65.2 kg 11 months ago and 52.4 kg this admission (19% wt loss, significant for time frame). Pt is likely severely malnourished but unable to diagnosis without NFPE.   Pt continues to make urine. Goes frequently throughout the day (unsure of amount). Used to take binders but recently stopped per MD.   I/O: -1,185 ml since admit Last HD today: 2000 ml net UF   Medications: calcitriol, 100 mg demadex daily, V B12 Labs: reviewed- last iPTH 1 year ago  Diet  Order:   Diet Order            Diet renal with fluid restriction Fluid restriction: 1200 mL Fluid; Room service appropriate? Yes with Assist; Fluid consistency: Thin  Diet effective now              EDUCATION NEEDS:   Education needs have been addressed  Skin:  Skin Assessment: Skin Integrity Issues: Skin Integrity Issues:: Incisions Incisions: L arm/chest  Last BM:  PTA  Height:   Ht Readings from Last 1 Encounters:  04/13/19 5\' 9"  (1.753 m)    Weight:   Wt Readings from Last 1 Encounters:  04/14/19 52.4 kg    Ideal Body Weight:  65.9 kg  BMI:  Body mass index is 17.06 kg/m.  Estimated Nutritional Needs:   Kcal:  1650-1850 kcal  Protein:  85-105 grams  Fluid:  1000 + UOP  Mariana Single RD, LDN Clinical Nutrition Pager # [phone]

## 2019-04-14 NOTE — Care Management Important Message (Signed)
Important Message  Patient Details  Name: Jocelyn Hill MRN: [id number] Date of Birth: 06-09-1951   Medicare Important Message Given:  Yes     Shelda Altes 04/14/2019, 2:02 PM

## 2019-04-14 NOTE — Progress Notes (Signed)
[facility] ROUNDING NOTE   Subjective:   This is a 68 year old lady that receives dialysis Monday Wednesday Friday at [facility] kidney center.  She has a history of diabetes coronary artery disease status post CABG congestive heart failure with ejection fraction of 30 to 35% history of hypertension prosthetic mitral valve replacement status post Maze procedure for atrial fibrillation and right pacemaker.  She was admitted 04/12/2019 with redness swelling of left upper arm graft this was excised 04/13/2019 appreciate assistance of Dr.Early  Medications amlodipine 10 mg daily Rena-Vite 1 daily, doxazosin 8 mg in the morning 4 mg in evening, gabapentin 300 mg daily, aspirin 81 mg daily atorvastatin 80 mg daily BuSpar 15 mg twice daily, carvedilol 25 mg twice daily, cholecalciferol 25 mcg daily, duloxetine 30 mg daily, hydralazine 100 mg 3 times daily, isosorbide 120 mg daily, levothyroxine 175 mcg once daily, Singulair 10 mg daily, Protonix 40 mg daily, Coumadin 3 mg daily, trazodone 100 mg nightly, alprazolam 0.25 mg at bedtime  Blood pressure 137/73 pulse 82 temperature 98.1 O2 sats 100% room air  Sodium 135 potassium 3.7 chloride 100 CO2 22 BUN 14 creatinine 2.56 glucose 98 calcium 8.8 phosphorus 3.3 albumin 3.7 WBC 14.2 platelets 241 hemoglobin 11.4   Objective:  Vital signs in last 24 hours:  Temp:  [97 F (36.1 C)-98.3 F (36.8 C)] 98.1 F (36.7 C) (10/15 0536) Pulse Rate:  [74-98] 74 (10/15 0429) Resp:  [12-22] 16 (10/15 0429) BP: (137-172)/(73-103) 137/73 (10/15 0429) SpO2:  [93 %-100 %] 99 % (10/15 0429) Weight:  [52.4 kg-55.3 kg] 52.4 kg (10/15 0500)  Weight change: -0.432 kg Filed Weights   04/13/19 2100 04/14/19 0041 04/14/19 0500  Weight: 55.3 kg 53.1 kg 52.4 kg    Intake/Output: I/O last 3 completed shifts: In: 865.2 [P.O.:240; I.V.:480.5; IV Piggyback:144.7] Out: 2050 [Other:2000; Blood:50]   Intake/Output this shift:  No intake/output data  recorded.  CVS-regular rate and rhythm 3/6 murmur left sternal edge RS-air entry clear bilaterally ABD- BS present soft non-distended   EXT-bandaged left upper extremity thigh catheter   Basic Metabolic Panel: Recent Labs  Lab 04/12/19 2247 04/13/19 0849 04/14/19 0330  NA 135 137 135  K 4.6 4.5 3.7  CL 103 107 100  CO2 20* 18* 22  GLUCOSE 101* 94 98  BUN 31* 32* 14  CREATININE 4.18* 4.22* 2.56*  CALCIUM 9.6 9.2 8.8*  PHOS  --   --  3.3    Liver Function Tests: Recent Labs  Lab 04/14/19 0330  ALBUMIN 3.7   No results for input(s): LIPASE, AMYLASE in the last 168 hours. No results for input(s): AMMONIA in the last 168 hours.  CBC: Recent Labs  Lab 04/12/19 2247 04/13/19 0849 04/14/19 0330  WBC 12.4* 10.6* 14.2*  HGB 12.1 11.0* 11.4*  HCT 35.4* 32.2* 32.0*  MCV 83.1 84.1 81.0  PLT 217 205 241    Cardiac Enzymes: No results for input(s): CKTOTAL, CKMB, CKMBINDEX, TROPONINI in the last 168 hours.  BNP: Invalid input(s): POCBNP  CBG: Recent Labs  Lab 04/13/19 0609 04/13/19 1347 04/14/19 0642  GLUCAP 98 97 92    Microbiology: Results for orders placed or performed during the hospital encounter of 04/12/19  Surgical pcr screen     Status: None   Collection Time: 04/13/19  4:06 AM   Specimen: Nasal Mucosa; Nasal Swab  Result Value Ref Range Status   MRSA, PCR NEGATIVE NEGATIVE Final   Staphylococcus aureus NEGATIVE NEGATIVE Final    Comment: (NOTE) The Xpert  SA Assay (FDA approved for NASAL specimens in patients 76 years of age and older), is one component of a comprehensive surveillance program. It is not intended to diagnose infection nor to guide or monitor treatment. Performed at [facility], [place] [address] [place], [place] [address]   Aerobic/Anaerobic Culture (surgical/deep wound)     Status: None (Preliminary result)   Collection Time: 04/13/19 12:05 PM   Specimen: PATH Cytology Misc. fluid; Body Fluid  Result Value Ref Range  Status   Specimen Description WOUND LEFT UPPER ARM  Final   Special Requests PERIGRAFT SAMPLE A PT ON VANC  Final   Gram Stain   Final    ABUNDANT WBC PRESENT, PREDOMINANTLY PMN MODERATE GRAM POSITIVE COCCI IN CLUSTERS Performed at [facility], [address] [place], [place] [address]    Culture PENDING  Incomplete   Report Status PENDING  Incomplete    Coagulation Studies: Recent Labs    04/12/19 2247 04/13/19 0849  LABPROT 22.2* 19.4*  INR 2.0* 1.7*    Urinalysis: No results for input(s): COLORURINE, LABSPEC, PHURINE, GLUCOSEU, HGBUR, BILIRUBINUR, KETONESUR, PROTEINUR, UROBILINOGEN, NITRITE, LEUKOCYTESUR in the last 72 hours.  Invalid input(s): APPERANCEUR    Imaging: No results found.   Medications:   . sodium chloride 10 mL/hr at 04/13/19 1043  . heparin 550 Units/hr (04/14/19 0723)   . amLODipine  10 mg Oral QHS  . aspirin EC  81 mg Oral QHS  . atorvastatin  80 mg Oral QPM  . buPROPion  100 mg Oral TID  . busPIRone  15 mg Oral BID  . calcitRIOL  0.5 mcg Oral Q M,W,F-HD  . carvedilol  6.25 mg Oral BID  . Chlorhexidine Gluconate Cloth  6 each Topical Daily  . doxazosin  8 mg Oral QHS  . DULoxetine  30 mg Oral Daily  . [START ON 04/15/2019] heparin  1,000 Units Intracatheter Q M,W,F-HD  . hydrALAZINE  100 mg Oral TID  . isosorbide mononitrate  120 mg Oral QHS  . levothyroxine  150 mcg Oral QAC breakfast  . pantoprazole  40 mg Oral Daily  . torsemide  100 mg Oral Daily  . traZODone  100 mg Oral QHS  . vancomycin  500 mg Intravenous Q M,W,F-HD  . vitamin B-12  2,500 mcg Oral QPM   acetaminophen, albuterol, ALPRAZolam, dextromethorphan-guaiFENesin, hydrALAZINE, morphine injection, nitroGLYCERIN, ondansetron (ZOFRAN) IV, oxyCODONE-acetaminophen, polyethylene glycol  Assessment/ Plan:   ESRD-Monday Wednesday Friday dialysis completed dialysis 04/13/2019 with removal of 2 L.  Through thigh catheter.  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

## 2019-04-14 NOTE — Plan of Care (Signed)
  Problem: Education: Goal: Knowledge of General Education information will improve Description Including pain rating scale, medication(s)/side effects and non-pharmacologic comfort measures Outcome: Progressing   

## 2019-04-14 NOTE — Progress Notes (Addendum)
[facility]  Subjective  - Doing well over all.  CC of chest pain, no SOB.   Objective 137/73 74 98.1 F (36.7 C) (Oral) 16 99%  Intake/Output Summary (Last 24 hours) at 04/14/2019 0720 Last data filed at 04/14/2019 0300 Gross per 24 hour  Intake 575.24 ml  Output 2050 ml  Net -1474.76 ml    Left radial pulse palpable, dressing changed twice over night for bloody drainage.  Minimal bloody drainage at proximal skin edges, showing signs of clotting, pulled Penrose drain about 1 cm, clean dry dressing reapplied. Chest pain better with warm compresses.  Slight discomfort left lateral chest wall to palpation.   Lungs non labored breathing EKG pending, currently in paced rhythm and rate    Assessment/Planning: POD # 1 Removal of infected left upper arm AV Gore-Tex graft and vein patch of left   Vancomycin IV, Afebrile, and WBC 14.2 Dressing changes PRN, if the area continues to need dressing changes RN will let me know. Continue warm compresses PRN for CP    Roxy Horseman 04/14/2019 7:20 AM --  Laboratory Lab Results: Recent Labs    04/13/19 0849 04/14/19 0330  WBC 10.6* 14.2*  HGB 11.0* 11.4*  HCT 32.2* 32.0*  PLT 205 241   BMET Recent Labs    04/13/19 0849 04/14/19 0330  NA 137 135  K 4.5 3.7  CL 107 100  CO2 18* 22  GLUCOSE 94 98  BUN 32* 14  CREATININE 4.22* 2.56*  CALCIUM 9.2 8.8*    COAG Lab Results  Component Value Date   INR 1.7 (H) 04/13/2019   INR 2.0 (H) 04/12/2019   INR 3.0 (H) 03/01/2019   No results found for: PTT  I have examined the patient, reviewed and agree with above.  Curt Jews, MD 04/14/2019 11:12 AM

## 2019-04-14 NOTE — Progress Notes (Signed)
Pt returned from dialysis. VS assessed and dressing on left upper arm changed d/t blood soaking the gauze. Pt pain level 10/10. Will continue to monitor. Lajoyce Corners, RN

## 2019-04-14 NOTE — Progress Notes (Signed)
Contacted [facility] via text page and received an order for an EKG for CP to r/o any cardiac cause for her pain. EKG showed ventricular pacing with PVCs. Will continue to monitor. Lajoyce Corners, RN

## 2019-04-14 NOTE — Progress Notes (Signed)
ANTICOAGULATION CONSULT NOTE - Follow Up Consult  Pharmacy Consult for Heparin Indication: atrial fibrillation  Allergies  Allergen Reactions  . Ace Inhibitors Anaphylaxis and Swelling  . Motrin Ib [Ibuprofen] Anaphylaxis    Patient Measurements: Height: 5\' 9"  (175.3 cm) Weight: 115 lb 8 oz (52.4 kg) IBW/kg (Calculated) : 66.2 Heparin Dosing Weight: 52.4 kg  Vital Signs: Temp: 98.1 F (36.7 C) (10/15 0536) Temp Source: Oral (10/15 0536) BP: 162/78 (10/15 0812) Pulse Rate: 83 (10/15 0812)  Labs: Recent Labs    04/12/19 2247 04/13/19 0849 04/14/19 0101 04/14/19 0330 04/14/19 1406  HGB 12.1 11.0*  --  11.4*  --   HCT 35.4* 32.2*  --  32.0*  --   PLT 217 205  --  241  --   APTT 39*  --   --   --   --   LABPROT 22.2* 19.4*  --   --   --   INR 2.0* 1.7*  --   --   --   HEPARINUNFRC  --   --  1.01* 0.88* <0.10*  CREATININE 4.18* 4.22*  --  2.56*  --     Estimated Creatinine Clearance: 17.6 mL/min (A) (by C-G formula based on SCr of 2.56 mg/dL (H)).  Assessment:  Anticoag: Coumadin PTA for afib and mitral valve replacement with bioprosthetic mitral valve . INR 1.7 on 10/14. IV heparin to start post-op. - 10/15: HL 1.01, drawn same arm heparin infusing in, lab there now so asked RN to ask lab to draw from below site, may not be able to do this as IV site is in hand, left arm restricted to draw from. Repeat AM HL 0.88 high. No INR this AM. Dressing changed twice over night for bloody drainage noted by vascular. Hgb 11.4 stable. Plts WNL. Now HL undetectable? No IV or infusion issues noted by RN  - 10/14 [id number]) Vit K 2mg  IV - PTA Coumadin 3mg  daily with admit INR 2  Goal of Therapy:  Heparin level 0.3-0.5 units/ml Monitor platelets by anticoagulation protocol: Yes   Plan:  Increase IV heparin to 700 units/hr (closer to previous rate) Check heparin level in 6 hrs. Daily HL and CBC   Joyclyn Plazola S. Alford Highland, PharmD, [place] Clinical Staff Pharmacist Eilene Ghazi  [place] 04/14/2019,3:07 PM

## 2019-04-14 NOTE — Progress Notes (Signed)
PROGRESS NOTE    Jocelyn Hill  [id number] DOB: Aug 06, 1950 DOA: 04/12/2019 PCP: [name], MD   Brief Narrative:  Patient is a 68 y.o. female with history of ESRD on HD MWF, recent AV fistula infection-apparently on IV antibiotics with HD, mitral valve replacement with bioprosthetic valve and maze procedure in [id number], chronic systolic heart failure, CAD s/p CABG-who was transferred from [facility] for evaluation of left arm AV fistula infection.    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[place]) Active Problems:   Hyperlipidemia, unspecified   Essential hypertension   Hypothyroidism   Depression   Paroxysmal atrial fibrillation (HCC)   Chronic combined systolic (congestive) and diastolic (congestive) heart failure (HCC)   Anemia in chronic kidney disease   Anxiety   S/P CABG x 2   S/P mitral valve replacement with bioprosthetic valve + CABG x2 + maze procedure   S/P Maze operation for atrial fibrillation   ESRD on dialysis ([place])   Stroke ([place])   Type II diabetes mellitus with renal manifestations ([place])   CAD (coronary artery disease)   GERD (gastroesophageal reflux disease)   Cough   Left upper extremity AV fistula infection: Continue vancomycin-evaluated by vascular surgery and taken to OR 10/14, Blood cultures 10/13-neG, wound culture pending, AFB by vasc-pending, patient did have some bleeding overnight so I will not initiate Coumadin, check cbc in AM, monitor wound  Chronic combined systolic/diastolic heart failure (EF 30-35% by TTE on October 2019): Volume status stable-diuresis with HD.  PAF: Rate controlled-INR is 1.7  as patient is s/p Coumadin 2 mg x 1 on 10/13.  Needs to be started on anticoagulation post OR-probably will require IV heparin until INR is therapeutic but holding on coumadin 2/2 wound bleeding  Addendum 3 pm 10/14 Prior attending spoke with Dr Shary Key to start heparin today-if tolerates well-ok to resume  coumadin on 10/15 but unfortunately pt still with bleeding overnight requiring several dressing changes.. No further vascular procedures planned.  History of mitral valve replacement with bioprosthetic valve in 2019: anticoag as above  CAD s/p CABG in 2019: No anginal symptoms.  Continue aspirin, statin and beta-blocker.  Hypertension: Controlled-continue hydralazine, Cardura, Coreg and Imdur.  ESRD-HD MWF: Nephrology consulted-defer dialysis to nephrology  Anemia: Appears mild-likely related to ESRD-follow periodically.  Hypothyroidism: Continue with levothyroxine  GERD: Continue PPI  DVT prophylaxis: On:hep gtt  Code Status: Full    Code Status Orders  (From admission, onward)         Start     Ordered   04/12/19 2241  Full code  Continuous     04/12/19 2242        Code Status History    Date Active Date Inactive Code Status Order ID Comments User Context   04/22/2018 1628 05/01/2018 1814 Full Code [id number]  Steve Rattler, DO Inpatient   02/09/2018 1652 03/05/2018 2215 Full Code [id number]  Georgiana Shore, NP Inpatient   07/03/2017 1411 07/03/2017 2143 Full Code [id number]  Larey Dresser, MD Inpatient   04/12/2017 1837 04/23/2017 2124 Full Code [id number]  Erick Colace, NP Inpatient   03/12/2017 1125 04/04/2017 2032 Full Code [id number]  Annita Brod, MD Inpatient   Advance Care Planning Activity     Family Communication: none today  Disposition Plan:   Patient remained inpatient for continued postoperative evaluation of AV fistula removal, bleeding, and transition from heparin drip to Coumadin when stable in the setting of a bioprosthetic  valve Consults called: None Admission status: Inpatient   Consultants:   renal, vasc surg  Procedures:   No results found.  Antimicrobials:   Vancomycin >10/14   Subjective: Reports bleeding soaked bandages multiple times overnight Otherwise no acute events  Objective: Vitals:   04/14/19 0429 04/14/19  0500 04/14/19 0536 04/14/19 0812  BP: 137/73   (!) 162/78  Pulse: 74   83  Resp: 16     Temp:   98.1 F (36.7 C)   TempSrc:   Oral   SpO2: 99%   100%  Weight:  52.4 kg    Height:        Intake/Output Summary (Last 24 hours) at 04/14/2019 1152 Last data filed at 04/14/2019 0300 Gross per 24 hour  Intake 575.24 ml  Output 2050 ml  Net -1474.76 ml   Filed Weights   04/13/19 2100 04/14/19 0041 04/14/19 0500  Weight: 55.3 kg 53.1 kg 52.4 kg    Examination:  General exam: Appears calm and comfortable  Respiratory system: Clear to auscultation. Respiratory effort normal. Cardiovascular system: S1 & S2 heard, RRR. No JVD, murmurs, rubs, gallops or clicks. No pedal edema. Gastrointestinal system: Abdomen is nondistended, soft and nontender. No organomegaly or masses felt. Normal bowel sounds heard. Central nervous system: Alert and oriented. No focal neurological deficits. Extremities: Bandage on left upper extremity no bleedthrough, did not take bandage down, neurovascularly intact Skin: No rashes, lesions or ulcers Psychiatry: Judgement and insight appear normal. Mood & affect appropriate.     Data Reviewed: I have personally reviewed following labs and imaging studies  CBC: Recent Labs  Lab 04/12/19 2247 04/13/19 0849 04/14/19 0330  WBC 12.4* 10.6* 14.2*  HGB 12.1 11.0* 11.4*  HCT 35.4* 32.2* 32.0*  MCV 83.1 84.1 81.0  PLT 217 205 [id number]   Basic Metabolic Panel: Recent Labs  Lab 04/12/19 2247 04/13/19 0849 04/14/19 0330  NA 135 137 135  K 4.6 4.5 3.7  CL 103 107 100  CO2 20* 18* 22  GLUCOSE 101* 94 98  BUN 31* 32* 14  CREATININE 4.18* 4.22* 2.56*  CALCIUM 9.6 9.2 8.8*  PHOS  --   --  3.3   GFR: Estimated Creatinine Clearance: 17.6 mL/min (A) (by C-G formula based on SCr of 2.56 mg/dL (H)). Liver Function Tests: Recent Labs  Lab 04/14/19 0330  ALBUMIN 3.7   No results for input(s): LIPASE, AMYLASE in the last 168 hours. No results for input(s):  AMMONIA in the last 168 hours. Coagulation Profile: Recent Labs  Lab 04/12/19 2247 04/13/19 0849  INR 2.0* 1.7*   Cardiac Enzymes: No results for input(s): CKTOTAL, CKMB, CKMBINDEX, TROPONINI in the last 168 hours. BNP (last 3 results) No results for input(s): PROBNP in the last 8760 hours. HbA1C: No results for input(s): HGBA1C in the last 72 hours. CBG: Recent Labs  Lab 04/13/19 0609 04/13/19 1347 04/14/19 0642  GLUCAP 98 97 92   Lipid Profile: No results for input(s): CHOL, HDL, LDLCALC, TRIG, CHOLHDL, LDLDIRECT in the last 72 hours. Thyroid Function Tests: No results for input(s): TSH, T4TOTAL, FREET4, T3FREE, THYROIDAB in the last 72 hours. Anemia Panel: No results for input(s): VITAMINB12, FOLATE, FERRITIN, TIBC, IRON, RETICCTPCT in the last 72 hours. Sepsis Labs: No results for input(s): PROCALCITON, LATICACIDVEN in the last 168 hours.  Recent Results (from the past 240 hour(s))  Culture, blood (Routine X 2) w Reflex to ID Panel     Status: None (Preliminary result)   Collection Time: 04/12/19 10:47 PM  Specimen: BLOOD RIGHT FOREARM  Result Value Ref Range Status   Specimen Description BLOOD RIGHT FOREARM  Final   Special Requests   Final    AEROBIC BOTTLE ONLY Blood Culture results may not be optimal due to an inadequate volume of blood received in culture bottles   Culture   Final    NO GROWTH 1 DAY Performed at [facility], [place] [address] [place], [place] [address]    Report Status PENDING  Incomplete  Culture, blood (Routine X 2) w Reflex to ID Panel     Status: None (Preliminary result)   Collection Time: 04/12/19 10:55 PM   Specimen: BLOOD RIGHT HAND  Result Value Ref Range Status   Specimen Description BLOOD RIGHT HAND  Final   Special Requests AEROBIC BOTTLE ONLY Blood Culture adequate volume  Final   Culture   Final    NO GROWTH 1 DAY Performed at [facility], [place] [address] [place], [place] [address]    Report Status PENDING   Incomplete  Surgical pcr screen     Status: None   Collection Time: 04/13/19  4:06 AM   Specimen: Nasal Mucosa; Nasal Swab  Result Value Ref Range Status   MRSA, PCR NEGATIVE NEGATIVE Final   Staphylococcus aureus NEGATIVE NEGATIVE Final    Comment: (NOTE) The Xpert SA Assay (FDA approved for NASAL specimens in patients 39 years of age and older), is one component of a comprehensive surveillance program. It is not intended to diagnose infection nor to guide or monitor treatment. Performed at [facility], [place] [address], [place], [place] [address]   Aerobic/Anaerobic Culture (surgical/deep wound)     Status: None (Preliminary result)   Collection Time: 04/13/19 12:05 PM   Specimen: PATH Cytology Misc. fluid; Body Fluid  Result Value Ref Range Status   Specimen Description WOUND LEFT UPPER ARM  Final   Special Requests PERIGRAFT SAMPLE A PT ON VANC  Final   Gram Stain   Final    ABUNDANT WBC PRESENT, PREDOMINANTLY PMN MODERATE GRAM POSITIVE COCCI IN CLUSTERS Performed at [facility], [address], [place], [place] [address]    Culture PENDING  Incomplete   Report Status PENDING  Incomplete         Radiology Studies: No results found.      Scheduled Meds: . amLODipine  10 mg Oral QHS  . aspirin EC  81 mg Oral QHS  . atorvastatin  80 mg Oral QPM  . buPROPion  100 mg Oral TID  . busPIRone  15 mg Oral BID  . calcitRIOL  0.5 mcg Oral Q M,W,F-HD  . carvedilol  6.25 mg Oral BID  . Chlorhexidine Gluconate Cloth  6 each Topical Q0600  . doxazosin  8 mg Oral QHS  . DULoxetine  30 mg Oral Daily  . [START ON 04/15/2019] heparin  1,000 Units Intracatheter Q M,W,F-HD  . hydrALAZINE  100 mg Oral TID  . isosorbide mononitrate  120 mg Oral QHS  . levothyroxine  150 mcg Oral QAC breakfast  . pantoprazole  40 mg Oral Daily  . torsemide  100 mg Oral Daily  . traZODone  100 mg Oral QHS  . vancomycin  500 mg Intravenous Q M,W,F-HD  . vitamin B-12  2,500 mcg Oral  QPM   Continuous Infusions: . sodium chloride 10 mL/hr at 04/13/19 1043  . heparin 550 Units/hr (04/14/19 0723)     LOS: 2 days    Time  spent: 35 min    Nicolette Bang, MD [facility]  If 7PM-7AM, please contact night-coverage  04/14/2019, 11:52 AM

## 2019-04-14 NOTE — Progress Notes (Signed)
[place] for Heparin Indication: atrial fibrillation  Allergies  Allergen Reactions  . Ace Inhibitors Anaphylaxis and Swelling  . Motrin Ib [Ibuprofen] Anaphylaxis    Patient Measurements: Height: 5\' 9"  (175.3 cm) Weight: 115 lb 8 oz (52.4 kg) IBW/kg (Calculated) : 66.2 Heparin Dosing Weight: 54kg  Vital Signs: Temp: 98.1 F (36.7 C) (10/15 0536) Temp Source: Oral (10/15 0536) BP: 137/73 (10/15 0429) Pulse Rate: 74 (10/15 0429)  Labs: Recent Labs    04/12/19 2247 04/13/19 0849 04/14/19 0101 04/14/19 0330  HGB 12.1 11.0*  --  11.4*  HCT 35.4* 32.2*  --  32.0*  PLT 217 205  --  241  APTT 39*  --   --   --   LABPROT 22.2* 19.4*  --   --   INR 2.0* 1.7*  --   --   HEPARINUNFRC  --   --  1.01* 0.88*  CREATININE 4.18* 4.22*  --  2.56*    Estimated Creatinine Clearance: 17.6 mL/min (A) (by C-G formula based on SCr of 2.56 mg/dL (H)).  Anticoag: Coumadin PTA for afib and mitral valve replacement with bioprosthetic mitral valve . INR 1.7 today. IV heparin to start post-op. - 10/14 [id number]) Vit K 2mg  IV - PTA Coumadin 3mg  daily with admit INR 2 Initial heparin level 1.01 units/ml.  Redraw 0.88 units/ml  Goal of Therapy:  Hep level 0.3-0.5 for a day post-op Monitor platelets by anticoagulation protocol: Yes   Plan:  Decrease heparin to 550 units/hr Check heparin level in 6 hrs. Daily HL and CBC  Thanks for allowing pharmacy to be a part of this patient's care.  Excell Seltzer, PharmD Clinical Pharmacist 04/14/2019,7:22 AM

## 2019-04-14 NOTE — Progress Notes (Signed)
Patient states she is having some pain under her left breast bone and radiates up around the side towards her armpit. She states it feels like an aching pain, when she breathes, when she holds her breath she cannot feel it. 1mg  of morphine was giving and also heat packs in a pillow case.  Tawanna Sat, RN 04/14/2019

## 2019-04-14 NOTE — Progress Notes (Signed)
Left upper arm drsg changed and reinforced several times during the shift. Penrose drain intact. Patient with constant left arm pain. Alternating PO and IV medications with little relief.

## 2019-04-15 ENCOUNTER — Other Ambulatory Visit: Payer: Self-pay

## 2019-04-15 DIAGNOSIS — E440 Moderate protein-calorie malnutrition: Secondary | ICD-10-CM | POA: Insufficient documentation

## 2019-04-15 LAB — RENAL FUNCTION PANEL
Albumin: 3.3 g/dL — ABNORMAL LOW (ref 3.5–5.0)
Anion gap: 9 (ref 5–15)
BUN: 23 mg/dL (ref 8–23)
CO2: 26 mmol/L (ref 22–32)
Calcium: 9.1 mg/dL (ref 8.9–10.3)
Chloride: 99 mmol/L (ref 98–111)
Creatinine, Ser: 4.22 mg/dL — ABNORMAL HIGH (ref 0.44–1.00)
GFR calc Af Amer: 12 mL/min — ABNORMAL LOW (ref >60)
GFR calc non Af Amer: 10 mL/min — ABNORMAL LOW (ref >60)
Glucose, Bld: 130 mg/dL — ABNORMAL HIGH (ref 70–99)
Phosphorus: 4.3 mg/dL (ref 2.5–4.6)
Potassium: 4.5 mmol/L (ref 3.5–5.1)
Sodium: 134 mmol/L — ABNORMAL LOW (ref 135–145)

## 2019-04-15 LAB — CBC
HCT: 28.8 % — ABNORMAL LOW (ref 36.0–46.0)
HCT: 31.6 % — ABNORMAL LOW (ref 36.0–46.0)
Hemoglobin: 10.2 g/dL — ABNORMAL LOW (ref 12.0–15.0)
Hemoglobin: 10.4 g/dL — ABNORMAL LOW (ref 12.0–15.0)
MCH: 28.7 pg (ref 26.0–34.0)
MCH: 28 pg (ref 26.0–34.0)
MCHC: 35.4 g/dL (ref 30.0–36.0)
MCHC: 32.9 g/dL (ref 30.0–36.0)
MCV: 81.1 fL (ref 80.0–100.0)
MCV: 84.9 fL (ref 80.0–100.0)
Platelets: 244 10*3/uL (ref 150–400)
Platelets: 236 10*3/uL (ref 150–400)
RBC: 3.55 MIL/uL — ABNORMAL LOW (ref 3.87–5.11)
RBC: 3.72 MIL/uL — ABNORMAL LOW (ref 3.87–5.11)
RDW: 13.5 % (ref 11.5–15.5)
RDW: 14.1 % (ref 11.5–15.5)
WBC: 9.9 10*3/uL (ref 4.0–10.5)
WBC: 9.6 10*3/uL (ref 4.0–10.5)
nRBC: 0 % (ref 0.0–0.2)
nRBC: 0 % (ref 0.0–0.2)

## 2019-04-15 LAB — ACID FAST SMEAR (AFB, MYCOBACTERIA): Acid Fast Smear: NEGATIVE

## 2019-04-15 LAB — HEPARIN LEVEL (UNFRACTIONATED)
Heparin Unfractionated: 0.22 IU/mL — ABNORMAL LOW (ref 0.30–0.70)
Heparin Unfractionated: 0.46 IU/mL (ref 0.30–0.70)

## 2019-04-15 LAB — PROTIME-INR
INR: 1.2 (ref 0.8–1.2)
Prothrombin Time: 15.1 seconds (ref 11.4–15.2)

## 2019-04-15 LAB — GLUCOSE, CAPILLARY: Glucose-Capillary: 118 mg/dL — ABNORMAL HIGH (ref 70–99)

## 2019-04-15 MED ORDER — PENTAFLUOROPROP-TETRAFLUOROETH EX AERO: 1.0000 "application " | INHALATION_SPRAY | CUTANEOUS | Status: DC | PRN

## 2019-04-15 MED ORDER — LIDOCAINE-PRILOCAINE 2.5-2.5 % EX CREA: 1.0000 "application " | TOPICAL_CREAM | CUTANEOUS | Status: DC | PRN

## 2019-04-15 MED ORDER — ALTEPLASE 2 MG IJ SOLR: 2.0000 mg | Freq: Once | INTRAMUSCULAR | Status: DC | PRN

## 2019-04-15 MED ORDER — HEPARIN SODIUM (PORCINE) 1000 UNIT/ML DIALYSIS: 1000.0000 [IU] | INTRAMUSCULAR | Status: DC | PRN

## 2019-04-15 MED ORDER — CLINDAMYCIN PHOSPHATE 600 MG/50ML IV SOLN
600.0000 mg | Freq: Three times a day (TID) | INTRAVENOUS | Status: DC
  Administered 2019-04-15 – 2019-04-16 (×4): 600 mg via INTRAVENOUS
  Filled 2019-04-15 (×5): qty 50

## 2019-04-15 MED ORDER — WARFARIN SODIUM 5 MG PO TABS
5.0000 mg | ORAL_TABLET | Freq: Once | ORAL | Status: AC
  Administered 2019-04-15: 5 mg via ORAL
  Filled 2019-04-15: qty 1

## 2019-04-15 MED ORDER — SODIUM CHLORIDE 0.9 % IV SOLN: 100.0000 mL | INTRAVENOUS | Status: DC | PRN

## 2019-04-15 MED ORDER — VANCOMYCIN HCL IN DEXTROSE 500-5 MG/100ML-% IV SOLN
INTRAVENOUS | Status: AC
  Administered 2019-04-15: 500 mg via INTRAVENOUS
  Filled 2019-04-15: qty 100

## 2019-04-15 MED ORDER — LIDOCAINE HCL (PF) 1 % IJ SOLN: 5.0000 mL | INTRAMUSCULAR | Status: DC | PRN

## 2019-04-15 MED ORDER — WARFARIN - PHARMACIST DOSING INPATIENT
Freq: Every day | Status: DC
  Administered 2019-04-17: 17:00:00

## 2019-04-15 MED ORDER — HEPARIN SODIUM (PORCINE) 1000 UNIT/ML IJ SOLN
INTRAMUSCULAR | Status: AC
  Filled 2019-04-15: qty 1

## 2019-04-15 MED ORDER — CALCITRIOL 0.5 MCG PO CAPS
ORAL_CAPSULE | ORAL | Status: AC
  Administered 2019-04-15: 0.5 ug via ORAL
  Filled 2019-04-15: qty 1

## 2019-04-15 NOTE — Progress Notes (Addendum)
  Progress Note    04/15/2019 4:15 PM 2 Days Post-Op  Subjective:  Having some pain    Vitals:   04/15/19 1230 04/15/19 1236  BP: 140/82 120/75  Pulse: 91 95  Resp: (!) 23 (!) 22  Temp:    SpO2: 100% 99%    Physical Exam: General:  No distress Lungs:  Non labored Incisions:  Clean with some bloody drainage on the bandage.  Penrose drain in place   CBC    Component Value Date/Time   WBC 9.6 04/15/2019 0612   RBC 3.72 (L) 04/15/2019 0612   HGB 10.4 (L) 04/15/2019 0612   HCT 31.6 (L) 04/15/2019 0612   PLT 236 04/15/2019 0612   MCV 84.9 04/15/2019 0612   MCH 28.0 04/15/2019 0612   MCHC 32.9 04/15/2019 0612   RDW 14.1 04/15/2019 0612   LYMPHSABS 0.5 (L) 03/03/2018 0415   MONOABS 0.6 03/03/2018 0415   EOSABS 0.7 03/03/2018 0415   BASOSABS 0.1 03/03/2018 0415    BMET    Component Value Date/Time   NA 134 (L) 04/15/2019 0612   K 4.5 04/15/2019 0612   CL 99 04/15/2019 0612   CO2 26 04/15/2019 0612   GLUCOSE 130 (H) 04/15/2019 0612   BUN 23 04/15/2019 0612   CREATININE 4.22 (H) 04/15/2019 0612   CREATININE 3.63 (H) 01/11/2018 1148   CALCIUM 9.1 04/15/2019 0612   CALCIUM 9.7 04/21/2017 0231   GFRNONAA 10 (L) 04/15/2019 0612   GFRAA 12 (L) 04/15/2019 0612    INR    Component Value Date/Time   INR 1.2 04/15/2019 0538     Intake/Output Summary (Last 24 hours) at 04/15/2019 1615 Last data filed at 04/15/2019 1056 Gross per 24 hour  Intake 324.86 ml  Output 2538 ml  Net -2213.14 ml     Assessment:  68 y.o. female is s/p:  Removal of infected left upper arm AV Gore-Tex graft and vein patch of left   2 Days Post-Op  Plan: -pt doing well this afternoon.  -dressing removed and penrose drain pulled bac about 1cm  -wound cx with moderate staph-continue abx   Jocelyn Locket, PA-C [facility] [phone] 04/15/2019 4:15 PM  Agree with the above.  Dressing changed and penrose backed out.  WOund looks good.  CX positive for staph,  continue abx  Wells Brabham

## 2019-04-15 NOTE — Progress Notes (Signed)
ANTICOAGULATION CONSULT NOTE - Follow Up Consult  Pharmacy Consult for Heparin Indication: atrial fibrillation  Allergies  Allergen Reactions  . Ace Inhibitors Anaphylaxis and Swelling  . Motrin Ib [Ibuprofen] Anaphylaxis    Patient Measurements: Height: 5\' 9"  (175.3 cm) Weight: 111 lb 8.8 oz (50.6 kg) IBW/kg (Calculated) : 66.2 Heparin Dosing Weight: 50.6 kg  Vital Signs: Temp: 97.5 F (36.4 C) (10/16 0703) Temp Source: Oral (10/16 0703) BP: 138/82 (10/16 0900) Pulse Rate: 89 (10/16 0900)  Labs: Recent Labs    04/12/19 2247 04/13/19 0849  04/14/19 0330 04/14/19 1406 04/14/19 2113 04/15/19 0538 04/15/19 0612 04/15/19 0838  HGB 12.1 11.0*  --  11.4*  --   --  10.2* 10.4*  --   HCT 35.4* 32.2*  --  32.0*  --   --  28.8* 31.6*  --   PLT 217 205  --  241  --   --  244 236  --   APTT 39*  --   --   --   --   --   --   --   --   LABPROT 22.2* 19.4*  --   --   --   --  15.1  --   --   INR 2.0* 1.7*  --   --   --   --  1.2  --   --   HEPARINUNFRC  --   --    < > 0.88* <0.10* <0.10*  --   --  0.22*  CREATININE 4.18* 4.22*  --  2.56*  --   --   --  4.22*  --    < > = values in this interval not displayed.    Estimated Creatinine Clearance: 10.3 mL/min (A) (by C-G formula based on SCr of 4.22 mg/dL (H)).    Assessment: Coumadin PTA for afib and mitral valve replacement with bioprosthetic mitral valve. INR 1.7 on 10/14.  S/p removal of infected AV graft 10/14 IV heparin resumed post-op. LUE oozing noted - warfarin remains on hold, okay with heparin per MD.  HL 10/16: 0.22 low, drawn in HD  Goal of Therapy:  INR 0.3-0.5 units/mL Monitor platelets by anticoagulation protocol: Yes   Plan:  Increase IV heparin by 2 u/kg/hr to 900 units/hr Check HL in 6 hrs Daily HL, CBC  Gaylan Gerold 04/15/2019,10:12 AM

## 2019-04-15 NOTE — Progress Notes (Signed)
[facility] Progress Note   Subjective:   Seen on HD - 3L UF goal and tolerating so far. C/o mild abdominal and CP.   Objective Vitals:   04/14/19 0812 04/14/19 1542 04/14/19 2025 04/15/19 0432  BP: (!) 162/78 (!) 166/91 140/79 (!) 153/78  Pulse: 83 94 91 83  Resp:   18 15  Temp:  97.6 F (36.4 C) 98.2 F (36.8 C) 98.3 F (36.8 C)  TempSrc:  Oral Oral Oral  SpO2: 100% 98% 96% 94%  Weight:    52.1 kg  Height:       Physical Exam General: Thin female, NAD. On room air. Heart: RRR; no murmur Lungs: CTAB Abdomen: soft, mild generalized tenderness without guarding or rebound Extremities: No LE edema, minimal muscle mass Dialysis Access: R femoral tunneled cath  Additional Objective Labs: Basic Metabolic Panel: Recent Labs  Lab 04/13/19 0849 04/14/19 0330 04/15/19 0612  NA 137 135 134*  K 4.5 3.7 4.5  CL 107 100 99  CO2 18* 22 26  GLUCOSE 94 98 130*  BUN 32* 14 23  CREATININE 4.22* 2.56* 4.22*  CALCIUM 9.2 8.8* 9.1  PHOS  --  3.3 4.3   Liver Function Tests: Recent Labs  Lab 04/14/19 0330 04/15/19 0612  ALBUMIN 3.7 3.3*   CBC: Recent Labs  Lab 04/12/19 2247 04/13/19 0849 04/14/19 0330 04/15/19 0538 04/15/19 0612  WBC 12.4* 10.6* 14.2* 9.9 9.6  HGB 12.1 11.0* 11.4* 10.2* 10.4*  HCT 35.4* 32.2* 32.0* 28.8* 31.6*  MCV 83.1 84.1 81.0 81.1 84.9  PLT 217 205 241 244 236   Blood Culture    Component Value Date/Time   SDES WOUND LEFT UPPER ARM 04/13/2019 1205   SPECREQUEST PERIGRAFT SAMPLE A PT ON VANC 04/13/2019 1205   CULT MODERATE STAPHYLOCOCCUS AUREUS 04/13/2019 1205   REPTSTATUS PENDING 04/13/2019 1205   Medications: . sodium chloride 10 mL/hr at 04/13/19 1043  . sodium chloride    . sodium chloride    . heparin 800 Units/hr (04/15/19 0424)   . amLODipine  10 mg Oral QHS  . aspirin EC  81 mg Oral QHS  . atorvastatin  80 mg Oral QPM  . buPROPion  100 mg Oral TID  . busPIRone  15 mg Oral BID  . calcitRIOL  0.5 mcg Oral Q  M,W,F-HD  . carvedilol  6.25 mg Oral BID  . Chlorhexidine Gluconate Cloth  6 each Topical Q0600  . doxazosin  8 mg Oral QHS  . DULoxetine  30 mg Oral Daily  . feeding supplement (NEPRO CARB STEADY)  237 mL Oral TID BM  . heparin  1,000 Units Intracatheter Q M,W,F-HD  . hydrALAZINE  100 mg Oral TID  . isosorbide mononitrate  120 mg Oral QHS  . levothyroxine  150 mcg Oral QAC breakfast  . multivitamin  1 tablet Oral QHS  . pantoprazole  40 mg Oral Daily  . torsemide  100 mg Oral Daily  . traZODone  100 mg Oral QHS  . vancomycin  500 mg Intravenous Q M,W,F-HD  . vitamin B-12  2,500 mcg Oral QPM    Dialysis Orders: MWF - [facility] 3:30hr, 400/800, EDW 56.5kg, 3K/2.25Ca, TDC, no heparin - Calcitriol 0.69mcg PO q HD - Mircera 65mcg IV q 4 weeks (last 9/24)  Assessment/Plan: 1. LUE AVG infection: BCx 10/13 negative. S/p excision 10/14 - wound Cx S. Aureus, final pending. On Vanc. Wound continues to be swollen with bloody oozing. 2. ESRD: Continue HD on MWF sched - HD  today. 3L UF goal. 3. HTN/volume: BP slightly high - UF as tolerated. 4. Anemia: Hgb 10.4 - follow. 5. Secondary hyperparathyroidism: Ca/Phos ok. Continue VDRA. No binders listed. 6. Nutrition: Alb low - continue Nepro supplements. 7. CAD (Hx CABG 2019) 8. A-fib/MVR/Hx MAZE procedure: Warfarin remains on hold s/p LUE oozing. On heparin. 9. Hypothyroidism: On replacement.  Veneta Penton, PA-C 04/15/2019, 8:39 AM  [facility] Pager: [phone]

## 2019-04-15 NOTE — Progress Notes (Signed)
PROGRESS NOTE    Jocelyn Hill  [id number] DOB: 05-02-1951 DOA: 04/12/2019 PCP: [name], MD   Brief Narrative:  Patient is a68 y.o.femalewith history of ESRD on HD MWF, recent AV fistula infection-apparently on IV antibiotics with HD, mitral valve replacement with bioprosthetic valve and maze procedure in [id number], chronic systolic heart failure, CAD s/p CABG-who was transferred from [facility] for evaluation of left arm AV fistula infection.    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[place]) Active Problems:   Hyperlipidemia, unspecified   Essential hypertension   Hypothyroidism   Depression   Paroxysmal atrial fibrillation (HCC)   Chronic combined systolic (congestive) and diastolic (congestive) heart failure (HCC)   Anemia in chronic kidney disease   Anxiety   S/P CABG x 2   S/P mitral valve replacement with bioprosthetic valve + CABG x2 + maze procedure   S/P Maze operation for atrial fibrillation   ESRD on dialysis ([place])   Stroke ([place])   Type II diabetes mellitus with renal manifestations ([place])   CAD (coronary artery disease)   GERD (gastroesophageal reflux disease)   Cough   Malnutrition of moderate degree   Left upper extremity AV fistula infection: evaluated by vascular surgery and taken to OR 10/14, Blood cultures 10/13-neG, wound culture showing staff, DC Vanco changed to clinda,  AFB by vasc-pending, patient previously had some bleeding from operative site, but overnight has been stable,  so I will iinitiate Coumadin per pharm consult, check cbc in AM, monitor wound  Chronic combined systolic/diastolic heart failure (EF 30-35% by TTE on October 2019): Volume status stable-diuresis with HD.  PAF: Rate controlled-INR is 1.2, s/p Coumadin 2 mg x 1 on 10/13.  Addendum 3 pm 10/14 Prior attending spoke with Dr Shary Key to start heparin today-if tolerates well-ok to resume coumadin on 10/15  -Because of bleeding this  was held, Coumadin will be reinitiated today with pharmacy consult  History of mitral valve replacement with bioprosthetic valve in 2019 with history of A. fib as above: anticoag as above  CAD s/p CABG in 2019: No anginal symptoms. Continue aspirin, statin and beta-blocker.  Hypertension:Controlled-continue hydralazine, Cardura, Coreg and Imdur.  ESRD-HD MWF: Nephrology consulted-defer dialysis to nephrology  Anemia:Appears mild-likely related to ESRD-follow periodically.  Hypothyroidism: Continue with levothyroxine  GERD:Continue PPI  DVT prophylaxis: [id number] gtt  transitioning to Coumadin  Code Status: Full    Code Status Orders  (From admission, onward)         Start     Ordered   04/12/19 2241  Full code  Continuous     04/12/19 2242        Code Status History    Date Active Date Inactive Code Status Order ID Comments User Context   04/22/2018 1628 05/01/2018 1814 Full Code [id number]  Steve Rattler, DO Inpatient   02/09/2018 1652 03/05/2018 2215 Full Code [id number]  Georgiana Shore, NP Inpatient   07/03/2017 1411 07/03/2017 2143 Full Code [id number]  Larey Dresser, MD Inpatient   04/12/2017 1837 04/23/2017 2124 Full Code [id number]  Erick Colace, NP Inpatient   03/12/2017 1125 04/04/2017 2032 Full Code [id number]  Annita Brod, MD Inpatient   Advance Care Planning Activity     Family Communication: called daughter discussed treatment plan,  Disposition Plan:   [facility] inpt for continued IV abx, postop care Consults called: none Admission status: Inpatient   Consultants:   vasc, nephrology  Procedures:   No results  found.  Antimicrobials:   vanc >10/14-10/16  clinda  >10/16   Subjective: Reports no additional bleeding from operative site Did report some postop pain  Objective: Vitals:   04/15/19 0930 04/15/19 1000 04/15/19 1030 04/15/19 1110  BP: 127/81 124/73 (!) 150/87 127/70  Pulse: 89 85 87 85  Resp:    16  Temp:    (!)  97.5 F (36.4 C)  TempSrc:    Oral  SpO2:    100%  Weight:      Height:        Intake/Output Summary (Last 24 hours) at 04/15/2019 1206 Last data filed at 04/15/2019 1056 Gross per 24 hour  Intake 772.36 ml  Output 3138 ml  Net -2365.64 ml   Filed Weights   04/14/19 0500 04/15/19 0432 04/15/19 0703  Weight: 52.4 kg 52.1 kg 50.6 kg    Examination:  General exam: Appears calm and comfortable  Respiratory system: Clear to auscultation. Respiratory effort normal. Cardiovascular system: S1 & S2 heard, RRR. No JVD, murmurs, rubs, gallops or clicks. No pedal edema. Gastrointestinal system: Abdomen is nondistended, soft and nontender. No organomegaly or masses felt. Normal bowel sounds heard. Central nervous system: Alert and oriented. No focal neurological deficits. Extremities: Warm well perfused, operative extremity neurovascularly intact no evidence of bleedthrough, did not take dressing down will defer to vascular we will follow-up with the patient today also  skin: No rashes, lesions or ulcers other than operative site as above Psychiatry: Judgement and insight appear normal. Mood & affect appropriate.     Data Reviewed: I have personally reviewed following labs and imaging studies  CBC: Recent Labs  Lab 04/12/19 2247 04/13/19 0849 04/14/19 0330 04/15/19 0538 04/15/19 0612  WBC 12.4* 10.6* 14.2* 9.9 9.6  HGB 12.1 11.0* 11.4* 10.2* 10.4*  HCT 35.4* 32.2* 32.0* 28.8* 31.6*  MCV 83.1 84.1 81.0 81.1 84.9  PLT 217 205 241 244 [id number]   Basic Metabolic Panel: Recent Labs  Lab 04/12/19 2247 04/13/19 0849 04/14/19 0330 04/15/19 0612  NA 135 137 135 134*  K 4.6 4.5 3.7 4.5  CL 103 107 100 99  CO2 20* 18* 22 26  GLUCOSE 101* 94 98 130*  BUN 31* 32* 14 23  CREATININE 4.18* 4.22* 2.56* 4.22*  CALCIUM 9.6 9.2 8.8* 9.1  PHOS  --   --  3.3 4.3   GFR: Estimated Creatinine Clearance: 10.3 mL/min (A) (by C-G formula based on SCr of 4.22 mg/dL (H)). Liver Function Tests:  Recent Labs  Lab 04/14/19 0330 04/15/19 0612  ALBUMIN 3.7 3.3*   No results for input(s): LIPASE, AMYLASE in the last 168 hours. No results for input(s): AMMONIA in the last 168 hours. Coagulation Profile: Recent Labs  Lab 04/12/19 2247 04/13/19 0849 04/15/19 0538  INR 2.0* 1.7* 1.2   Cardiac Enzymes: No results for input(s): CKTOTAL, CKMB, CKMBINDEX, TROPONINI in the last 168 hours. BNP (last 3 results) No results for input(s): PROBNP in the last 8760 hours. HbA1C: No results for input(s): HGBA1C in the last 72 hours. CBG: Recent Labs  Lab 04/13/19 0609 04/13/19 1347 04/14/19 0642 04/15/19 0621  GLUCAP 98 97 92 118*   Lipid Profile: No results for input(s): CHOL, HDL, LDLCALC, TRIG, CHOLHDL, LDLDIRECT in the last 72 hours. Thyroid Function Tests: No results for input(s): TSH, T4TOTAL, FREET4, T3FREE, THYROIDAB in the last 72 hours. Anemia Panel: No results for input(s): VITAMINB12, FOLATE, FERRITIN, TIBC, IRON, RETICCTPCT in the last 72 hours. Sepsis Labs: No results for input(s): PROCALCITON, LATICACIDVEN  in the last 168 hours.  Recent Results (from the past 240 hour(s))  Culture, blood (Routine X 2) w Reflex to ID Panel     Status: None (Preliminary result)   Collection Time: 04/12/19 10:47 PM   Specimen: BLOOD RIGHT FOREARM  Result Value Ref Range Status   Specimen Description BLOOD RIGHT FOREARM  Final   Special Requests   Final    AEROBIC BOTTLE ONLY Blood Culture results may not be optimal due to an inadequate volume of blood received in culture bottles   Culture   Final    NO GROWTH 2 DAYS Performed at [facility], [place] [address], [place], [place] [address]    Report Status PENDING  Incomplete  Culture, blood (Routine X 2) w Reflex to ID Panel     Status: None (Preliminary result)   Collection Time: 04/12/19 10:55 PM   Specimen: BLOOD RIGHT HAND  Result Value Ref Range Status   Specimen Description BLOOD RIGHT HAND  Final   Special Requests  AEROBIC BOTTLE ONLY Blood Culture adequate volume  Final   Culture   Final    NO GROWTH 2 DAYS Performed at [facility], [place] [address] [place], [place] [address]    Report Status PENDING  Incomplete  Surgical pcr screen     Status: None   Collection Time: 04/13/19  4:06 AM   Specimen: Nasal Mucosa; Nasal Swab  Result Value Ref Range Status   MRSA, PCR NEGATIVE NEGATIVE Final   Staphylococcus aureus NEGATIVE NEGATIVE Final    Comment: (NOTE) The Xpert SA Assay (FDA approved for NASAL specimens in patients 78 years of age and older), is one component of a comprehensive surveillance program. It is not intended to diagnose infection nor to guide or monitor treatment. Performed at [facility], [place] [address] [place], [place] [address]   Aerobic/Anaerobic Culture (surgical/deep wound)     Status: None (Preliminary result)   Collection Time: 04/13/19 12:05 PM   Specimen: PATH Cytology Misc. fluid; Body Fluid  Result Value Ref Range Status   Specimen Description WOUND LEFT UPPER ARM  Final   Special Requests PERIGRAFT SAMPLE A PT ON VANC  Final   Gram Stain   Final    ABUNDANT WBC PRESENT, PREDOMINANTLY PMN MODERATE GRAM POSITIVE COCCI IN CLUSTERS    Culture MODERATE STAPHYLOCOCCUS AUREUS  Final   Report Status PENDING  Incomplete   Organism ID, Bacteria STAPHYLOCOCCUS AUREUS  Final      Susceptibility   Staphylococcus aureus - MIC*    CIPROFLOXACIN <=0.5 SENSITIVE Sensitive     ERYTHROMYCIN 0.5 SENSITIVE Sensitive     GENTAMICIN <=0.5 SENSITIVE Sensitive     OXACILLIN 0.5 SENSITIVE Sensitive     TETRACYCLINE <=1 SENSITIVE Sensitive     VANCOMYCIN <=0.5 SENSITIVE Sensitive     TRIMETH/SULFA <=10 SENSITIVE Sensitive     CLINDAMYCIN <=0.25 SENSITIVE Sensitive     RIFAMPIN <=0.5 SENSITIVE Sensitive     Inducible Clindamycin Value in next row Sensitive      NEGATIVEPerformed at [place] [address], [place], [place] [address]    * MODERATE  STAPHYLOCOCCUS AUREUS         Radiology Studies: No results found.      Scheduled Meds: . amLODipine  10 mg Oral QHS  . aspirin EC  81 mg Oral QHS  . atorvastatin  80 mg Oral QPM  . buPROPion  100 mg Oral TID  . busPIRone  15 mg Oral BID  . calcitRIOL  0.5 mcg Oral Q M,W,F-HD  . carvedilol  6.25 mg Oral BID  . Chlorhexidine Gluconate Cloth  6 each Topical Q0600  . doxazosin  8 mg Oral QHS  . DULoxetine  30 mg Oral Daily  . feeding supplement (NEPRO CARB STEADY)  237 mL Oral TID BM  . heparin      . heparin  1,000 Units Intracatheter Q M,W,F-HD  . hydrALAZINE  100 mg Oral TID  . isosorbide mononitrate  120 mg Oral QHS  . levothyroxine  150 mcg Oral QAC breakfast  . multivitamin  1 tablet Oral QHS  . pantoprazole  40 mg Oral Daily  . torsemide  100 mg Oral Daily  . traZODone  100 mg Oral QHS  . vancomycin  500 mg Intravenous Q M,W,F-HD  . vitamin B-12  2,500 mcg Oral QPM   Continuous Infusions: . sodium chloride 10 mL/hr at 04/13/19 1043  . heparin 800 Units/hr (04/15/19 0424)     LOS: 3 days    Time spent: 87 min    Nicolette Bang, MD [facility]  If 7PM-7AM, please contact night-coverage  04/15/2019, 12:06 PM

## 2019-04-15 NOTE — Progress Notes (Signed)
Patient c/o chest pain 10/10. Two slntg given without relief. EKG and frequent. Will give morphine per MD. Paged MD and made aware. ,Pt resting with call bell within reach.  Will continue to monitor.

## 2019-04-15 NOTE — Progress Notes (Signed)
ANTICOAGULATION CONSULT NOTE - Initial Consult  Pharmacy Consult for Coumadin Indication: atrial fibrillation  Allergies  Allergen Reactions  . Ace Inhibitors Anaphylaxis and Swelling  . Motrin Ib [Ibuprofen] Anaphylaxis    Patient Measurements: Height: 5\' 9"  (175.3 cm) Weight: 111 lb 8.8 oz (50.6 kg) IBW/kg (Calculated) : 66.2 Heparin Dosing Weight: 50.6 kg  Vital Signs: Temp: 97.5 F (36.4 C) (10/16 1110) Temp Source: Oral (10/16 1110) BP: 120/75 (10/16 1236) Pulse Rate: 95 (10/16 1236)  Labs: Recent Labs    04/12/19 2247 04/13/19 0849  04/14/19 0330 04/14/19 1406 04/14/19 2113 04/15/19 0538 04/15/19 0612 04/15/19 0838  HGB 12.1 11.0*  --  11.4*  --   --  10.2* 10.4*  --   HCT 35.4* 32.2*  --  32.0*  --   --  28.8* 31.6*  --   PLT 217 205  --  241  --   --  244 236  --   APTT 39*  --   --   --   --   --   --   --   --   LABPROT 22.2* 19.4*  --   --   --   --  15.1  --   --   INR 2.0* 1.7*  --   --   --   --  1.2  --   --   HEPARINUNFRC  --   --    < > 0.88* <0.10* <0.10*  --   --  0.22*  CREATININE 4.18* 4.22*  --  2.56*  --   --   --  4.22*  --    < > = values in this interval not displayed.    Estimated Creatinine Clearance: 10.3 mL/min (A) (by C-G formula based on SCr of 4.22 mg/dL (H)).   Medical History: Past Medical History:  Diagnosis Date  . Anemia    low iron  . Anxiety   . Arthritis   . Bronchitis   . CHF (congestive heart failure) ([place])   . Coronary artery disease   . Depression   . Diabetes mellitus without complication ([place])   . Diet-controlled diabetes mellitus ([place])   . ESRD (end stage renal disease) ([place]) 03/16/2017   MWF- Tia Alert  . Ganglion cyst    left wrist  . GERD (gastroesophageal reflux disease)   . Headache    chronic headaches  . Heart failure ([place])   . Heart murmur   . Hyperlipidemia   . Hypertension   . Hypothyroidism   . Mitral regurgitation   . Myocardial infarction ([place])    3x last one 2008  . PAF  (paroxysmal atrial fibrillation) ([place])   . Pneumonia    x 2  . S/P Maze operation for atrial fibrillation 02/18/2018   Complete bilateral atrial lesion set using bipolar radiofrequency and cryothermy ablation with clipping of LA appendage  . S/P mitral valve replacement with bioprosthetic valve 02/18/2018   [facility] Mitral stented bovine pericardial tissue valve Model 7300 TFX Serial # [id number] Size 29  . Stroke [facility])    no lasting residual - ? 2014  . Umbilical hernia     Assessment:  Anticoag: Coumadin PTA for afib and mitral valve replacement with bioprosthetic mitral valve . INR 1.7 on 10/14. IV heparin to start post-op. Bleeding from operative site has stabilized and stopped. - 10/16 HL 0.22 low on 800ut/hr. INR 1.2. Drawn in HD. Hgb 11.4>10.4, plt ok  - 10/14 [id number]) Vit K 2mg  IV -  PTA Coumadin 3mg  daily with admit INR 2  Goal of Therapy:  INR 2-3 Monitor platelets by anticoagulation protocol: Yes   Plan:  - Increased IV heparin to 900 units/hr and recheck in 6 hrs. Clarified with Dr. Harvie Junior to con't until INR>2. - Coumadin 5mg  po x 1 tonight - Daily HL and CBC and INR -NOTE: previous HLs have fluctuated likely due to inability to get accurate blood draw.  Heparin is infusing in right arm and left arm is restricted.    Stelios Kirby S. Alford Highland, PharmD, [place] Clinical Staff Pharmacist [facility] 04/15/2019,12:59 PM

## 2019-04-15 NOTE — Progress Notes (Signed)
ANTICOAGULATION CONSULT NOTE - Follow Up Consult  Pharmacy Consult for Heparin Indication: atrial fibrillation  Allergies  Allergen Reactions  . Ace Inhibitors Anaphylaxis and Swelling  . Motrin Ib [Ibuprofen] Anaphylaxis    Patient Measurements: Height: 5\' 9"  (175.3 cm) Weight: 107 lb 12.9 oz (48.9 kg) IBW/kg (Calculated) : 66.2 Heparin Dosing Weight: 50.6 kg  Vital Signs: Temp: 98.1 F (36.7 C) (10/16 1633) Temp Source: Oral (10/16 1633) BP: 140/80 (10/16 1633) Pulse Rate: 78 (10/16 1633)  Labs: Recent Labs    04/12/19 2247 04/13/19 0849  04/14/19 0330  04/14/19 2113 04/15/19 0538 04/15/19 0612 04/15/19 0838 04/15/19 1709  HGB 12.1 11.0*  --  11.4*  --   --  10.2* 10.4*  --   --   HCT 35.4* 32.2*  --  32.0*  --   --  28.8* 31.6*  --   --   PLT 217 205  --  241  --   --  244 236  --   --   APTT 39*  --   --   --   --   --   --   --   --   --   LABPROT 22.2* 19.4*  --   --   --   --  15.1  --   --   --   INR 2.0* 1.7*  --   --   --   --  1.2  --   --   --   HEPARINUNFRC  --   --    < > 0.88*   < > <0.10*  --   --  0.22* 0.46  CREATININE 4.18* 4.22*  --  2.56*  --   --   --  4.22*  --   --    < > = values in this interval not displayed.    Estimated Creatinine Clearance: 10 mL/min (A) (by C-G formula based on SCr of 4.22 mg/dL (H)).   Assessment: Coumadin PTA for afib and mitral valve replacement with bioprosthetic mitral valve. INR 1.7 on 10/14.  S/p removal of infected AV graft 10/14 IV heparin resumed post-op. LUE oozing noted   Heparin level therapeutic this PM  Goal of Therapy:  INR 0.3-0.5 units/mL Monitor platelets by anticoagulation protocol: Yes   Plan:  Continue heparin at 900 units / hr Follow up AM labs  Thank you Anette Guarneri, PharmD [phone]  04/15/2019,5:42 PM

## 2019-04-16 LAB — CBC
HCT: 30.5 % — ABNORMAL LOW (ref 36.0–46.0)
Hemoglobin: 10.5 g/dL — ABNORMAL LOW (ref 12.0–15.0)
MCH: 28.2 pg (ref 26.0–34.0)
MCHC: 34.4 g/dL (ref 30.0–36.0)
MCV: 82 fL (ref 80.0–100.0)
Platelets: 239 10*3/uL (ref 150–400)
RBC: 3.72 MIL/uL — ABNORMAL LOW (ref 3.87–5.11)
RDW: 13.7 % (ref 11.5–15.5)
WBC: 8 10*3/uL (ref 4.0–10.5)
nRBC: 0 % (ref 0.0–0.2)

## 2019-04-16 LAB — PROTIME-INR
INR: 1.2 (ref 0.8–1.2)
Prothrombin Time: 15.5 seconds — ABNORMAL HIGH (ref 11.4–15.2)

## 2019-04-16 LAB — HEPARIN LEVEL (UNFRACTIONATED): Heparin Unfractionated: 0.34 IU/mL (ref 0.30–0.70)

## 2019-04-16 LAB — GLUCOSE, CAPILLARY: Glucose-Capillary: 228 mg/dL — ABNORMAL HIGH (ref 70–99)

## 2019-04-16 MED ORDER — WARFARIN SODIUM 5 MG PO TABS
5.0000 mg | ORAL_TABLET | Freq: Once | ORAL | Status: AC
  Administered 2019-04-16: 18:00:00 5 mg via ORAL
  Filled 2019-04-16: qty 1

## 2019-04-16 MED ORDER — DIPHENHYDRAMINE HCL 25 MG PO CAPS
25.0000 mg | ORAL_CAPSULE | Freq: Four times a day (QID) | ORAL | Status: DC | PRN
  Administered 2019-04-16 – 2019-04-18 (×3): 25 mg via ORAL
  Filled 2019-04-16 (×3): qty 1

## 2019-04-16 MED ORDER — CEFAZOLIN SODIUM-DEXTROSE 2-4 GM/100ML-% IV SOLN
2.0000 g | INTRAVENOUS | Status: DC
  Administered 2019-04-18: 2 g via INTRAVENOUS
  Filled 2019-04-16 (×3): qty 100

## 2019-04-16 NOTE — Progress Notes (Signed)
Pharmacy Antibiotic Note  Jocelyn Hill is a 68 y.o. female admitted on 04/12/2019 with L arm fistula infection. Pt has hx of ESRD on HD MWF. Previously being treated with vancomycin, now with culture growing MSSA so pharmacy has been consulted for cefazolin dosing. Next HD planned for Monday 10/19, last was on schedule on 10/16. Pt received appropriate vancomycin dose on 10/16 during HD after vancomycin was already confirmed to be therapeutic with random level on 10/14, thus no additional antimicrobial is needed until next HD session as vancomycin will cover MSSA in the meantime.  Plan: Cefazolin 2g IV qHD MWF   Height: 5\' 9"  (175.3 cm) Weight: 116 lb 13.5 oz (53 kg) IBW/kg (Calculated) : 66.2  Temp (24hrs), Avg:98 F (36.7 C), Min:97.7 F (36.5 C), Max:98.2 F (36.8 C)  Recent Labs  Lab 04/12/19 2247 04/13/19 0007 04/13/19 0849 04/14/19 0330 04/15/19 0538 04/15/19 0612 04/16/19 0332  WBC 12.4*  --  10.6* 14.2* 9.9 9.6 8.0  CREATININE 4.18*  --  4.22* 2.56*  --  4.22*  --   VANCORANDOM  --  20  --   --   --   --   --     Estimated Creatinine Clearance: 10.8 mL/min (A) (by C-G formula based on SCr of 4.22 mg/dL (H)).    Allergies  Allergen Reactions  . Ace Inhibitors Anaphylaxis and Swelling  . Motrin Ib [Ibuprofen] Anaphylaxis    Antimicrobials this admission: Vancomycin 10/14 >> 10/16 Clindamycin 10/16 >> 10/17 Cefazolin 10/17 >>  Dose adjustments this admission: none  Microbiology results: 10/13 BCx: NGTD x3 days 10/14 deep wound: MSSA  Thank you for allowing pharmacy to be a part of this patient's care.   Arrie Senate, PharmD, BCPS Clinical Pharmacist [phone] Please check AMION for all [place] numbers 04/16/2019

## 2019-04-16 NOTE — Progress Notes (Addendum)
  Progress Note    04/16/2019 9:59 AM 3 Days Post-Op  Subjective:  No complaints  Afebrile  Vitals:   04/16/19 0447 04/16/19 0953  BP: (!) 147/89 (!) 145/81  Pulse: 89   Resp: 20   Temp: 97.7 F (36.5 C)   SpO2: 100%     Physical Exam: Cardiac:  regular Lungs:  Non labored Incisions:  Bloody drainage on bandage, which is not unexpected.  Penrose drain in place and pulled back about a cm proximally and distally. Extremities:  3+ left radial pulse   CBC    Component Value Date/Time   WBC 8.0 04/16/2019 0332   RBC 3.72 (L) 04/16/2019 0332   HGB 10.5 (L) 04/16/2019 0332   HCT 30.5 (L) 04/16/2019 0332   PLT 239 04/16/2019 0332   MCV 82.0 04/16/2019 0332   MCH 28.2 04/16/2019 0332   MCHC 34.4 04/16/2019 0332   RDW 13.7 04/16/2019 0332   LYMPHSABS 0.5 (L) 03/03/2018 0415   MONOABS 0.6 03/03/2018 0415   EOSABS 0.7 03/03/2018 0415   BASOSABS 0.1 03/03/2018 0415    BMET    Component Value Date/Time   NA 134 (L) 04/15/2019 0612   K 4.5 04/15/2019 0612   CL 99 04/15/2019 0612   CO2 26 04/15/2019 0612   GLUCOSE 130 (H) 04/15/2019 0612   BUN 23 04/15/2019 0612   CREATININE 4.22 (H) 04/15/2019 0612   CREATININE 3.63 (H) 01/11/2018 1148   CALCIUM 9.1 04/15/2019 0612   CALCIUM 9.7 04/21/2017 0231   GFRNONAA 10 (L) 04/15/2019 0612   GFRAA 12 (L) 04/15/2019 0612    INR    Component Value Date/Time   INR 1.2 04/16/2019 0332     Intake/Output Summary (Last 24 hours) at 04/16/2019 0959 Last data filed at 04/16/2019 [id number] Gross per 24 hour  Intake 836.25 ml  Output 2088 ml  Net -1251.75 ml     Assessment:  68 y.o. female is s/p:  Removal of infected left upper arm AV Gore-Tex graft and vein patch of left   3 Days Post-Op  Plan: -penrose pulled back more today -continue abx for staph  -easily palpable left radial pulse -daily dressing change   Leontine Locket, PA-C [facility] [phone] 04/16/2019 9:59 AM  I agree with the  above.  The dressing was changed.  The Penrose drain was further extruded.  The wound appears healthy.  Continue IV antibiotics and dressing changes.  Annamarie Major

## 2019-04-16 NOTE — Progress Notes (Addendum)
ANTICOAGULATION CONSULT NOTE - Initial Consult  Pharmacy Consult for Coumadin Indication: atrial fibrillation  Allergies  Allergen Reactions  . Ace Inhibitors Anaphylaxis and Swelling  . Motrin Ib [Ibuprofen] Anaphylaxis    Patient Measurements: Height: 5\' 9"  (175.3 cm) Weight: 116 lb 13.5 oz (53 kg) IBW/kg (Calculated) : 66.2 Heparin Dosing Weight: 54 kg  Vital Signs: Temp: 97.7 F (36.5 C) (10/17 0447) Temp Source: Oral (10/17 0447) BP: 147/89 (10/17 0447) Pulse Rate: 89 (10/17 0447)  Labs: Recent Labs    04/14/19 0330  04/15/19 0538 04/15/19 0612 04/15/19 0838 04/15/19 1709 04/16/19 0332  HGB 11.4*  --  10.2* 10.4*  --   --  10.5*  HCT 32.0*  --  28.8* 31.6*  --   --  30.5*  PLT 241  --  244 236  --   --  239  LABPROT  --   --  15.1  --   --   --  15.5*  INR  --   --  1.2  --   --   --  1.2  HEPARINUNFRC 0.88*   < >  --   --  0.22* 0.46 0.34  CREATININE 2.56*  --   --  4.22*  --   --   --    < > = values in this interval not displayed.    Estimated Creatinine Clearance: 10.8 mL/min (A) (by C-G formula based on SCr of 4.22 mg/dL (H)).   Medical History: Past Medical History:  Diagnosis Date  . Anemia    low iron  . Anxiety   . Arthritis   . Bronchitis   . CHF (congestive heart failure) ([place])   . Coronary artery disease   . Depression   . Diabetes mellitus without complication ([place])   . Diet-controlled diabetes mellitus ([place])   . ESRD (end stage renal disease) ([place]) 03/16/2017   MWF- Tia Alert  . Ganglion cyst    left wrist  . GERD (gastroesophageal reflux disease)   . Headache    chronic headaches  . Heart failure ([place])   . Heart murmur   . Hyperlipidemia   . Hypertension   . Hypothyroidism   . Mitral regurgitation   . Myocardial infarction ([place])    3x last one 2008  . PAF (paroxysmal atrial fibrillation) ([place])   . Pneumonia    x 2  . S/P Maze operation for atrial fibrillation 02/18/2018   Complete bilateral atrial lesion set using bipolar  radiofrequency and cryothermy ablation with clipping of LA appendage  . S/P mitral valve replacement with bioprosthetic valve 02/18/2018   [facility] Mitral stented bovine pericardial tissue valve Model 7300 TFX Serial # [id number] Size 29  . Stroke [facility])    no lasting residual - ? 2014  . Umbilical hernia     Assessment:  Anticoag: Coumadin PTA for afib and mitral valve replacement with bioprosthetic mitral valve . INR 2.0 on admission (10/13). IV heparin to start post-op. S/p AV graft on 10/14. Bleeding from operative site has stabilized and stopped. 1 x dose on 10/14 (0043) Vit K 2mg  IV. PTA dose of warfarin is 3mg  daily  Heparin level this am is therapeutic at 0.34 on 900 units/hour. INR is subtherapeutic at 1.2 s/p 1x dose of 5 mg warfarin.   H&H is stable at 10.5/30.5, plts wnl.    Goal of Therapy:  Heparin level 2-3 INR 2-3 Monitor platelets by anticoagulation protocol: Yes   Plan:  - Continue heparin at 900 units/hour  -  Warfarin 5 mg x 1 tonight  - Daily HL and CBC and INR - monitor signs of bleeding   -NOTE: previous HLs have fluctuated likely due to inability to get accurate blood draw.  Heparin is infusing in right arm and left arm is restricted.   Thank you,   Eddie Candle, PharmD PGY-1 Pharmacy Resident   Please check amion for clinical pharmacist contact number  04/16/2019

## 2019-04-16 NOTE — Progress Notes (Signed)
[facility] Progress Note  Subjective: nauseous today, no other c/o  Vitals:   04/15/19 1633 04/15/19 2103 04/16/19 0447 04/16/19 0953  BP: 140/80 (!) 148/78 (!) 147/89 (!) 145/81  Pulse: 78 78 89   Resp: (!) 22 16 20    Temp: 98.1 F (36.7 C) 98.2 F (36.8 C) 97.7 F (36.5 C)   TempSrc: Oral Oral Oral   SpO2: 100% 100% 100%   Weight:   53 kg   Height:        Inpatient medications: . amLODipine  10 mg Oral QHS  . aspirin EC  81 mg Oral QHS  . atorvastatin  80 mg Oral QPM  . buPROPion  100 mg Oral TID  . busPIRone  15 mg Oral BID  . calcitRIOL  0.5 mcg Oral Q M,W,F-HD  . carvedilol  6.25 mg Oral BID  . Chlorhexidine Gluconate Cloth  6 each Topical Q0600  . doxazosin  8 mg Oral QHS  . DULoxetine  30 mg Oral Daily  . feeding supplement (NEPRO CARB STEADY)  237 mL Oral TID BM  . heparin  1,000 Units Intracatheter Q M,W,F-HD  . hydrALAZINE  100 mg Oral TID  . isosorbide mononitrate  120 mg Oral QHS  . levothyroxine  150 mcg Oral QAC breakfast  . multivitamin  1 tablet Oral QHS  . pantoprazole  40 mg Oral Daily  . torsemide  100 mg Oral Daily  . traZODone  100 mg Oral QHS  . vitamin B-12  2,500 mcg Oral QPM  . warfarin  5 mg Oral ONCE-1800  . Warfarin - Pharmacist Dosing Inpatient   Does not apply q1800   . sodium chloride 10 mL/hr at 04/13/19 1043  . clindamycin (CLEOCIN) IV 600 mg (04/16/19 1434)  . heparin 900 Units/hr (04/16/19 1142)   acetaminophen, albuterol, ALPRAZolam, dextromethorphan-guaiFENesin, diphenhydrAMINE, hydrALAZINE, morphine injection, nitroGLYCERIN, ondansetron (ZOFRAN) IV, oxyCODONE-acetaminophen, polyethylene glycol    Exam: General: Thin female, NAD. On room air. Heart: RRR; no murmur Lungs: CTAB Abdomen: soft, mild generalized tenderness without guarding or rebound Extremities: No LE edema, minimal muscle mass, LUA in ace wrap Dialysis Access: R femoral tunneled cath    Dialysis: [facility]  3.5h  400/800   56.5kg  3K/2.25  bath  LUA AVG (now removed)/ TDC new   Hep none - Calcitriol 0.79mcg PO q HD - Mircera 20mcg IV q 4 weeks (last 9/24)  Assessment/Plan: 1. LUE AVG infection: BCx 10/13 negative. S/p excision/ removal 10/14 - wound Cx +MSSA. On Clinda IV 2. ESRD: Continue HD on MWF sched. Next HD 10/19 Monday.  3. HTN/volume: BP slightly high - UF as tolerated. Cont meds.  4. Anemia: Hgb 10.4 - follow. 5. Secondary hyperparathyroidism: Ca/Phos ok. Continue VDRA. No binders listed. 6. Nutrition: Alb low - continue Nepro supplements. 7. CAD (Hx CABG 2019) 8. A-fib/ bio-MVR/Hx MAZE procedure: Warfarin remains on hold s/p LUE oozing. On heparin. 9. Hypothyroidism: On replacement.   Rob Arshi Duarte 04/16/2019, 3:46 PM  Iron/TIBC/Ferritin/ %Sat    Component Value Date/Time   IRON 18 (L) 04/18/2018 0229   TIBC 224 (L) 04/18/2018 0229   FERRITIN 98 04/17/2018 0942   IRONPCTSAT 8 (L) 04/18/2018 0229   Recent Labs  Lab 04/15/19 0612 04/16/19 0332  NA 134*  --   K 4.5  --   CL 99  --   CO2 26  --   GLUCOSE 130*  --   BUN 23  --   CREATININE 4.22*  --  CALCIUM 9.1  --   PHOS 4.3  --   ALBUMIN 3.3*  --   INR  --  1.2   No results for input(s): AST, ALT, ALKPHOS, BILITOT, PROT in the last 168 hours. Recent Labs  Lab 04/16/19 0332  WBC 8.0  HGB 10.5*  HCT 30.5*  PLT 239

## 2019-04-16 NOTE — Progress Notes (Signed)
PROGRESS NOTE    Jocelyn Hill  [id number] DOB: [birth year] DOA: 04/12/2019 PCP: [name], MD   Brief Narrative:  Patient is a68 y.o.femalewith history of ESRD on HD MWF, recent AV fistula infection-apparently on IV antibiotics with HD, mitral valve replacement with bioprosthetic valve and maze procedure in [id number], chronic systolic heart failure, CAD s/p CABG-who was transferred from [facility] for evaluation of left arm AV fistula infection.   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[place]) Active Problems:   Hyperlipidemia, unspecified   Essential hypertension   Hypothyroidism   Depression   Paroxysmal atrial fibrillation (HCC)   Chronic combined systolic (congestive) and diastolic (congestive) heart failure (HCC)   Anemia in chronic kidney disease   Anxiety   S/P CABG x 2   S/P mitral valve replacement with bioprosthetic valve + CABG x2 + maze procedure   S/P Maze operation for atrial fibrillation   ESRD on dialysis ([place])   Stroke ([place])   Type II diabetes mellitus with renal manifestations ([place])   CAD (coronary artery disease)   GERD (gastroesophageal reflux disease)   Cough   Malnutrition of moderate degree   Left upper extremity AV fistula infection: evaluated by vascular surgeryand taken to OR 10/14,drain still in place, Blood cultures 10/13-neG, wound culture showing MSSA, narrowed abx in anticipation of d/c,  AFB by vasc-pending, patient previously had some bleeding from operative site, but has been stable,   iinitiated Coumadin per pharm consult 10/16, check cbc in AM, monitor wound  Chronic combined systolic/diastolic heart failure (EF 30-35% by TTE on October 2019): Volume status stable-diuresis with HD.  PAF: Rate controlled- monitor INR.  Addendum 3 pm10/14 Prior attending spokewith Dr Shary Key to start heparin today-if tolerates well-ok to resume coumadin on 10/15  -Because of bleeding this was held,  Coumadin will be reinitiated 10/16 with pharmacy consult  History of mitral valve replacement with bioprosthetic valve in 2067 with history of A. fib as above: anticoag as above  CAD s/p CABG in 2019: No anginal symptoms. Continue aspirin, statin and beta-blocker.  Hypertension:Controlled-continue hydralazine, Cardura, Coreg and Imdur.  ESRD-HD MWF: Nephrology consulted-defer dialysis to nephrology  Anemia:Appears mild-likely related to ESRD-follow periodically.  Hypothyroidism: Continue with levothyroxine  GERD:Continue PPI  DVT prophylaxis: Coumadin with heparin bridge Code Status: Full    Code Status Orders  (From admission, onward)         Start     Ordered   04/12/19 2241  Full code  Continuous     04/12/19 2242        Code Status History    Date Active Date Inactive Code Status Order ID Comments User Context   04/22/2018 1628 05/01/2018 1814 Full Code [id number]  Steve Rattler, DO Inpatient   02/09/2018 1652 03/05/2018 2215 Full Code [id number]  Georgiana Shore, NP Inpatient   07/03/2017 1411 07/03/2017 2143 Full Code [id number]  Larey Dresser, MD Inpatient   04/12/2017 1837 04/23/2017 2124 Full Code [id number]  Erick Colace, NP Inpatient   03/12/2017 1125 04/04/2017 2032 Full Code [id number]  Annita Brod, MD Inpatient   Advance Care Planning Activity     Family Communication: Discussed with patient today and with daughter in detail 10/16,  Disposition Plan:   Patient remained in the hospital with continued monitoring by vascular surgery with drain in place postoperatively.  Because of patient's bleeding will bridge with heparin and Coumadin monitoring hemodynamics and bleeding at surgical site while  in the hospital.  Patient is not yet stable for medical discharge Consults called: None Admission status: Inpatient   Consultants:   vasc surg, nephrology  Procedures:   No results found.  Antimicrobials:   vanc >10/14-10/16  clinda  >10/16     Subjective: No acute events overnight Resting in bed Pain is improving [place] drain still in place  Objective: Vitals:   04/15/19 1627 04/15/19 1633 04/15/19 2103 04/16/19 0447  BP: (!) 152/91 140/80 (!) 148/78 (!) 147/89  Pulse:  78 78 89  Resp:  (!) 22 16 20   Temp:  98.1 F (36.7 C) 98.2 F (36.8 C) 97.7 F (36.5 C)  TempSrc:  Oral Oral Oral  SpO2:  100% 100% 100%  Weight:    53 kg  Height:        Intake/Output Summary (Last 24 hours) at 04/16/2019 0938 Last data filed at 04/16/2019 0650 Gross per 24 hour  Intake 836.25 ml  Output 2088 ml  Net -1251.75 ml   Filed Weights   04/15/19 0703 04/15/19 1110 04/16/19 0447  Weight: 50.6 kg 48.9 kg 53 kg    Examination:  General exam: Appears calm and comfortable  Respiratory system: Clear to auscultation. Respiratory effort normal. Cardiovascular system: S1 & S2 heard, RRR. No JVD, murmurs, rubs, gallops or clicks. No pedal edema. Gastrointestinal system: Abdomen is nondistended, soft and nontender. No organomegaly or masses felt. Normal bowel sounds heard. Central nervous system: Alert and oriented. No focal neurological deficits. Extremities: Drain and bandage in place on left upper extremity, neurovascularly intact, no evidence of bleeding, Skin: No rashes, lesions or ulcers other than surgical incision as above Psychiatry: Judgement and insight appear normal. Mood & affect appropriate.     Data Reviewed: I have personally reviewed following labs and imaging studies  CBC: Recent Labs  Lab 04/13/19 0849 04/14/19 0330 04/15/19 0538 04/15/19 0612 04/16/19 0332  WBC 10.6* 14.2* 9.9 9.6 8.0  HGB 11.0* 11.4* 10.2* 10.4* 10.5*  HCT 32.2* 32.0* 28.8* 31.6* 30.5*  MCV 84.1 81.0 81.1 84.9 82.0  PLT 205 241 244 236 [id number]   Basic Metabolic Panel: Recent Labs  Lab 04/12/19 2247 04/13/19 0849 04/14/19 0330 04/15/19 0612  NA 135 137 135 134*  K 4.6 4.5 3.7 4.5  CL 103 107 100 99  CO2 20* 18* 22 26  GLUCOSE  101* 94 98 130*  BUN 31* 32* 14 23  CREATININE 4.18* 4.22* 2.56* 4.22*  CALCIUM 9.6 9.2 8.8* 9.1  PHOS  --   --  3.3 4.3   GFR: Estimated Creatinine Clearance: 10.8 mL/min (A) (by C-G formula based on SCr of 4.22 mg/dL (H)). Liver Function Tests: Recent Labs  Lab 04/14/19 0330 04/15/19 0612  ALBUMIN 3.7 3.3*   No results for input(s): LIPASE, AMYLASE in the last 168 hours. No results for input(s): AMMONIA in the last 168 hours. Coagulation Profile: Recent Labs  Lab 04/12/19 2247 04/13/19 0849 04/15/19 0538 04/16/19 0332  INR 2.0* 1.7* 1.2 1.2   Cardiac Enzymes: No results for input(s): CKTOTAL, CKMB, CKMBINDEX, TROPONINI in the last 168 hours. BNP (last 3 results) No results for input(s): PROBNP in the last 8760 hours. HbA1C: No results for input(s): HGBA1C in the last 72 hours. CBG: Recent Labs  Lab 04/13/19 0609 04/13/19 1347 04/14/19 0642 04/15/19 0621 04/16/19 0602  GLUCAP 98 97 92 118* 228*   Lipid Profile: No results for input(s): CHOL, HDL, LDLCALC, TRIG, CHOLHDL, LDLDIRECT in the last 72 hours. Thyroid Function Tests: No results  for input(s): TSH, T4TOTAL, FREET4, T3FREE, THYROIDAB in the last 72 hours. Anemia Panel: No results for input(s): VITAMINB12, FOLATE, FERRITIN, TIBC, IRON, RETICCTPCT in the last 72 hours. Sepsis Labs: No results for input(s): PROCALCITON, LATICACIDVEN in the last 168 hours.  Recent Results (from the past 240 hour(s))  Culture, blood (Routine X 2) w Reflex to ID Panel     Status: None (Preliminary result)   Collection Time: 04/12/19 10:47 PM   Specimen: BLOOD RIGHT FOREARM  Result Value Ref Range Status   Specimen Description BLOOD RIGHT FOREARM  Final   Special Requests   Final    AEROBIC BOTTLE ONLY Blood Culture results may not be optimal due to an inadequate volume of blood received in culture bottles   Culture   Final    NO GROWTH 3 DAYS Performed at [facility], [place] [address] [place], [place] [address]     Report Status PENDING  Incomplete  Culture, blood (Routine X 2) w Reflex to ID Panel     Status: None (Preliminary result)   Collection Time: 04/12/19 10:55 PM   Specimen: BLOOD RIGHT HAND  Result Value Ref Range Status   Specimen Description BLOOD RIGHT HAND  Final   Special Requests AEROBIC BOTTLE ONLY Blood Culture adequate volume  Final   Culture   Final    NO GROWTH 3 DAYS Performed at [facility], [place] [address], [place], [place] [address]    Report Status PENDING  Incomplete  Surgical pcr screen     Status: None   Collection Time: 04/13/19  4:06 AM   Specimen: Nasal Mucosa; Nasal Swab  Result Value Ref Range Status   MRSA, PCR NEGATIVE NEGATIVE Final   Staphylococcus aureus NEGATIVE NEGATIVE Final    Comment: (NOTE) The Xpert SA Assay (FDA approved for NASAL specimens in patients 75 years of age and older), is one component of a comprehensive surveillance program. It is not intended to diagnose infection nor to guide or monitor treatment. Performed at [facility], [place] [address], [place], [place] [address]   Aerobic/Anaerobic Culture (surgical/deep wound)     Status: None (Preliminary result)   Collection Time: 04/13/19 12:05 PM   Specimen: PATH Cytology Misc. fluid; Body Fluid  Result Value Ref Range Status   Specimen Description WOUND LEFT UPPER ARM  Final   Special Requests PERIGRAFT SAMPLE A PT ON VANC  Final   Gram Stain   Final    ABUNDANT WBC PRESENT, PREDOMINANTLY PMN MODERATE GRAM POSITIVE COCCI IN CLUSTERS Performed at [facility], [address], [place], [place] [address]    Culture   Final    MODERATE STAPHYLOCOCCUS AUREUS NO ANAEROBES ISOLATED; CULTURE IN PROGRESS FOR 5 DAYS    Report Status PENDING  Incomplete   Organism ID, Bacteria STAPHYLOCOCCUS AUREUS  Final      Susceptibility   Staphylococcus aureus - MIC*    CIPROFLOXACIN <=0.5 SENSITIVE Sensitive     ERYTHROMYCIN 0.5 SENSITIVE Sensitive     GENTAMICIN <=0.5 SENSITIVE  Sensitive     OXACILLIN 0.5 SENSITIVE Sensitive     TETRACYCLINE <=1 SENSITIVE Sensitive     VANCOMYCIN <=0.5 SENSITIVE Sensitive     TRIMETH/SULFA <=10 SENSITIVE Sensitive     CLINDAMYCIN <=0.25 SENSITIVE Sensitive     RIFAMPIN <=0.5 SENSITIVE Sensitive     Inducible Clindamycin NEGATIVE Sensitive     * MODERATE STAPHYLOCOCCUS AUREUS  Acid Fast Smear (AFB)     Status: None  Collection Time: 04/13/19 12:05 PM   Specimen: PATH Cytology Misc. fluid; Body Fluid  Result Value Ref Range Status   AFB Specimen Processing Concentration  Final   Acid Fast Smear Negative  Final    Comment: (NOTE) Performed At: [facility] [place], [place] [id number] Rush Farmer MD [id number]    Source (AFB) LEFT  Final    Comment: UPPER ARM PERIGRAFT SAMPLE A Performed at [facility], [place] [address], [place],  [address]          Radiology Studies: No results found.      Scheduled Meds: . amLODipine  10 mg Oral QHS  . aspirin EC  81 mg Oral QHS  . atorvastatin  80 mg Oral QPM  . buPROPion  100 mg Oral TID  . busPIRone  15 mg Oral BID  . calcitRIOL  0.5 mcg Oral Q M,W,F-HD  . carvedilol  6.25 mg Oral BID  . Chlorhexidine Gluconate Cloth  6 each Topical Q0600  . doxazosin  8 mg Oral QHS  . DULoxetine  30 mg Oral Daily  . feeding supplement (NEPRO CARB STEADY)  237 mL Oral TID BM  . heparin  1,000 Units Intracatheter Q M,W,F-HD  . hydrALAZINE  100 mg Oral TID  . isosorbide mononitrate  120 mg Oral QHS  . levothyroxine  150 mcg Oral QAC breakfast  . multivitamin  1 tablet Oral QHS  . pantoprazole  40 mg Oral Daily  . torsemide  100 mg Oral Daily  . traZODone  100 mg Oral QHS  . vancomycin  500 mg Intravenous Q M,W,F-HD  . vitamin B-12  2,500 mcg Oral QPM  . Warfarin - Pharmacist Dosing Inpatient   Does not apply q1800   Continuous Infusions: . sodium chloride 10 mL/hr at 04/13/19 1043  . clindamycin (CLEOCIN) IV Stopped (04/16/19 0630)  .  heparin 900 Units/hr (04/16/19 0650)     LOS: 4 days    Time spent: 35 min    Nicolette Bang, MD [facility]  If 7PM-7AM, please contact night-coverage  04/16/2019, 9:38 AM

## 2019-04-17 LAB — CBC
HCT: 31.8 % — ABNORMAL LOW (ref 36.0–46.0)
Hemoglobin: 10.8 g/dL — ABNORMAL LOW (ref 12.0–15.0)
MCH: 28 pg (ref 26.0–34.0)
MCHC: 34 g/dL (ref 30.0–36.0)
MCV: 82.4 fL (ref 80.0–100.0)
Platelets: 272 10*3/uL (ref 150–400)
RBC: 3.86 MIL/uL — ABNORMAL LOW (ref 3.87–5.11)
RDW: 13.8 % (ref 11.5–15.5)
WBC: 11.6 10*3/uL — ABNORMAL HIGH (ref 4.0–10.5)
nRBC: 0 % (ref 0.0–0.2)

## 2019-04-17 LAB — TROPONIN I (HIGH SENSITIVITY)
Troponin I (High Sensitivity): 66 ng/L — ABNORMAL HIGH (ref <18)
Troponin I (High Sensitivity): 57 ng/L — ABNORMAL HIGH (ref <18)

## 2019-04-17 LAB — BASIC METABOLIC PANEL
Anion gap: 15 (ref 5–15)
BUN: 20 mg/dL (ref 8–23)
CO2: 26 mmol/L (ref 22–32)
Calcium: 10.1 mg/dL (ref 8.9–10.3)
Chloride: 95 mmol/L — ABNORMAL LOW (ref 98–111)
Creatinine, Ser: 4.47 mg/dL — ABNORMAL HIGH (ref 0.44–1.00)
GFR calc Af Amer: 11 mL/min — ABNORMAL LOW (ref >60)
GFR calc non Af Amer: 10 mL/min — ABNORMAL LOW (ref >60)
Glucose, Bld: 138 mg/dL — ABNORMAL HIGH (ref 70–99)
Potassium: 3.6 mmol/L (ref 3.5–5.1)
Sodium: 136 mmol/L (ref 135–145)

## 2019-04-17 LAB — GLUCOSE, CAPILLARY: Glucose-Capillary: 135 mg/dL — ABNORMAL HIGH (ref 70–99)

## 2019-04-17 LAB — HEPARIN LEVEL (UNFRACTIONATED): Heparin Unfractionated: 0.32 IU/mL (ref 0.30–0.70)

## 2019-04-17 LAB — PROTIME-INR
INR: 1.7 — ABNORMAL HIGH (ref 0.8–1.2)
Prothrombin Time: 19.9 seconds — ABNORMAL HIGH (ref 11.4–15.2)

## 2019-04-17 MED ORDER — POLYETHYLENE GLYCOL 3350 17 G PO PACK: 17.0000 g | PACK | Freq: Once | ORAL | Status: DC

## 2019-04-17 MED ORDER — CHLORHEXIDINE GLUCONATE CLOTH 2 % EX PADS
6.0000 | MEDICATED_PAD | Freq: Every day | CUTANEOUS | Status: DC
  Administered 2019-04-18: 6 via TOPICAL

## 2019-04-17 MED ORDER — ALUM & MAG HYDROXIDE-SIMETH 200-200-20 MG/5ML PO SUSP
30.0000 mL | Freq: Four times a day (QID) | ORAL | Status: DC | PRN
  Administered 2019-04-17: 30 mL via ORAL
  Filled 2019-04-17: qty 30

## 2019-04-17 MED ORDER — CEFAZOLIN SODIUM-DEXTROSE 1-4 GM/50ML-% IV SOLN
1.0000 g | Freq: Once | INTRAVENOUS | Status: AC
  Administered 2019-04-17: 1 g via INTRAVENOUS
  Filled 2019-04-17: qty 50

## 2019-04-17 MED ORDER — BISACODYL 5 MG PO TBEC
10.0000 mg | DELAYED_RELEASE_TABLET | Freq: Every day | ORAL | Status: DC | PRN
  Administered 2019-04-17 – 2019-04-18 (×2): 10 mg via ORAL
  Filled 2019-04-17 (×2): qty 2

## 2019-04-17 MED ORDER — WARFARIN SODIUM 3 MG PO TABS
3.0000 mg | ORAL_TABLET | Freq: Once | ORAL | Status: AC
  Administered 2019-04-17: 3 mg via ORAL
  Filled 2019-04-17: qty 1

## 2019-04-17 NOTE — Progress Notes (Signed)
ANTICOAGULATION CONSULT NOTE - Initial Consult  Pharmacy Consult for Coumadin Indication: atrial fibrillation  Allergies  Allergen Reactions  . Ace Inhibitors Anaphylaxis and Swelling  . Motrin Ib [Ibuprofen] Anaphylaxis    Patient Measurements: Height: 5\' 9"  (175.3 cm) Weight: 109 lb 12.6 oz (49.8 kg) IBW/kg (Calculated) : 66.2 Heparin Dosing Weight: 54 kg  Vital Signs: Temp: 98.2 F (36.8 C) (10/18 0542) Temp Source: Oral (10/18 0542) BP: 173/83 (10/18 0640) Pulse Rate: 81 (10/18 0542)  Labs: Recent Labs    04/15/19 0538 04/15/19 0612  04/15/19 1709 04/16/19 0332 04/17/19 0331 04/17/19 0838  HGB 10.2* 10.4*  --   --  10.5* 10.8*  --   HCT 28.8* 31.6*  --   --  30.5* 31.8*  --   PLT 244 236  --   --  239 272  --   LABPROT 15.1  --   --   --  15.5*  --  19.9*  INR 1.2  --   --   --  1.2  --  1.7*  HEPARINUNFRC  --   --    < > 0.46 0.34  --  0.32  CREATININE  --  4.22*  --   --   --  4.47*  --   TROPONINIHS  --   --   --   --   --   --  66*   < > = values in this interval not displayed.    Estimated Creatinine Clearance: 9.6 mL/min (A) (by C-G formula based on SCr of 4.47 mg/dL (H)).   Medical History: Past Medical History:  Diagnosis Date  . Anemia    low iron  . Anxiety   . Arthritis   . Bronchitis   . CHF (congestive heart failure) ([place])   . Coronary artery disease   . Depression   . Diabetes mellitus without complication ([place])   . Diet-controlled diabetes mellitus ([place])   . ESRD (end stage renal disease) ([place]) 03/16/2017   MWF- [facility]  . Ganglion cyst    left wrist  . GERD (gastroesophageal reflux disease)   . Headache    chronic headaches  . Heart failure ([place])   . Heart murmur   . Hyperlipidemia   . Hypertension   . Hypothyroidism   . Mitral regurgitation   . Myocardial infarction ([place])    3x last one 2008  . PAF (paroxysmal atrial fibrillation) ([place])   . Pneumonia    x 2  . S/P Maze operation for atrial fibrillation 02/18/2018    Complete bilateral atrial lesion set using bipolar radiofrequency and cryothermy ablation with clipping of LA appendage  . S/P mitral valve replacement with bioprosthetic valve 02/18/2018   [facility] Mitral stented bovine pericardial tissue valve Model 7300 TFX Serial # [id number] Size 29  . Stroke [facility])    no lasting residual - ? 2014  . Umbilical hernia     Assessment:  Anticoag: Coumadin PTA for afib and mitral valve replacement with bioprosthetic mitral valve . INR 2.0 on admission (10/13). IV heparin to start post-op. S/p AV graft on 10/14. Bleeding from operative site has stabilized and stopped. 1 x dose on 10/14 (0043) Vit K 2mg  IV. PTA dose of warfarin is 3mg  daily  Heparin level this am is therapeutic at 0.32 on 900 units/hour. INR is subtherapeutic but trending up, today at 1.7. Expect INR to be within range tomorrow.  H&H is stable at 10.8/31.8, plts wnl. Some bleeding noted on bandages of  arm.    Goal of Therapy:  Heparin level 2-3 INR 2-3 Monitor platelets by anticoagulation protocol: Yes   Plan:  - Continue heparin at 900 units/hour  - Warfarin 3 mg x 1 tonight  - Daily HL and CBC and INR - monitor signs of bleeding   -NOTE: previous HLs have fluctuated likely due to inability to get accurate blood draw.  Heparin is infusing in right arm and left arm is restricted.   Thank you,   Eddie Candle, PharmD PGY-1 Pharmacy Resident   Please check amion for clinical pharmacist contact number  04/17/2019

## 2019-04-17 NOTE — Progress Notes (Signed)
Pt given K pad to help with L side pain. Pt educated on how device works. Pad placed on left abdomen. Pt resting. Will continue to monitor. Lajoyce Corners, RN

## 2019-04-17 NOTE — Progress Notes (Addendum)
[facility] Progress Note  Subjective: nauseous again and constipated, got dulcolax this am  Vitals:   04/17/19 0542 04/17/19 0635 04/17/19 0640 04/17/19 0720  BP: (!) 165/69 (!) 165/76 (!) 173/83 (!) 153/92  Pulse: 81   87  Resp: 17 12 11 13   Temp: 98.2 F (36.8 C)     TempSrc: Oral     SpO2: 99%   98%  Weight: 49.8 kg     Height:        Inpatient medications: . amLODipine  10 mg Oral QHS  . aspirin EC  81 mg Oral QHS  . atorvastatin  80 mg Oral QPM  . buPROPion  100 mg Oral TID  . busPIRone  15 mg Oral BID  . calcitRIOL  0.5 mcg Oral Q M,W,F-HD  . carvedilol  6.25 mg Oral BID  . Chlorhexidine Gluconate Cloth  6 each Topical Q0600  . doxazosin  8 mg Oral QHS  . DULoxetine  30 mg Oral Daily  . feeding supplement (NEPRO CARB STEADY)  237 mL Oral TID BM  . heparin  1,000 Units Intracatheter Q M,W,F-HD  . hydrALAZINE  100 mg Oral TID  . isosorbide mononitrate  120 mg Oral QHS  . levothyroxine  150 mcg Oral QAC breakfast  . multivitamin  1 tablet Oral QHS  . pantoprazole  40 mg Oral Daily  . polyethylene glycol  17 g Oral Once  . traZODone  100 mg Oral QHS  . vitamin B-12  2,500 mcg Oral QPM  . warfarin  3 mg Oral ONCE-1800  . Warfarin - Pharmacist Dosing Inpatient   Does not apply q1800   . [START ON 04/18/2019]  ceFAZolin (ANCEF) IV    . heparin 900 Units/hr (04/16/19 1142)   acetaminophen, albuterol, ALPRAZolam, alum & mag hydroxide-simeth, bisacodyl, dextromethorphan-guaiFENesin, diphenhydrAMINE, hydrALAZINE, morphine injection, nitroGLYCERIN, ondansetron (ZOFRAN) IV, oxyCODONE-acetaminophen, polyethylene glycol    Exam: General: Thin female, NAD. On room air. Heart: RRR; no murmur Lungs: CTAB Abdomen: soft, mild generalized tenderness without guarding or rebound Extremities: No LE edema, minimal muscle mass, LUA in ace wrap Dialysis Access: R femoral tunneled cath    Dialysis: [facility]  3.5h  400/800   56.5kg  3K/2.25 bath  LUA AVG (now  removed)/ TDC new   Hep none - Calcitriol 0.22mcg PO q HD - Mircera 67mcg IV q 4 weeks (last 9/24)  Assessment/Plan: 1. LUE AVG infection: graft was nonfunctional ligated on 03/01/19 for L hand steal syndrome. S/p excision 10/14 - wound Cx +MSSA. Clinda IV now switched to Ancef, complete 10 days from surgery through 10/24.  2. ESRD: Continue HD on MWF sched. Using fem TDC which was present on admission. BCx's neg here. HD Monday.  3. HTN/volume: BP slightly high, 6kg under dry. Lower at dc. Dry on exam. Keep even.  4. Anemia: Hgb 10.4 - follow. 5. Secondary hyperparathyroidism: Ca/Phos ok. Continue VDRA. No binders listed. 6. Nutrition: Alb low - continue Nepro supplements. 7. CAD (Hx CABG 2019) 8. A-fib/ bio-MVR/Hx MAZE procedure: Warfarin remains on hold s/p LUE oozing. On heparin. 9. Hypothyroidism: On replacement 10. Consipation: wrote for miralax if dulcolax doesn't work (at pt request), OOB, etc   Rob Doctor, hospital 04/17/2019, 2:23 PM  Iron/TIBC/Ferritin/ %Sat    Component Value Date/Time   IRON 18 (L) 04/18/2018 0229   TIBC 224 (L) 04/18/2018 0229   FERRITIN 98 04/17/2018 0942   IRONPCTSAT 8 (L) 04/18/2018 0229   Recent Labs  Lab 04/15/19 0612  04/17/19 0331  04/17/19 0838  NA 134*  --  136  --   K 4.5  --  3.6  --   CL 99  --  95*  --   CO2 26  --  26  --   GLUCOSE 130*  --  138*  --   BUN 23  --  20  --   CREATININE 4.22*  --  4.47*  --   CALCIUM 9.1  --  10.1  --   PHOS 4.3  --   --   --   ALBUMIN 3.3*  --   --   --   INR  --    < >  --  1.7*   < > = values in this interval not displayed.   No results for input(s): AST, ALT, ALKPHOS, BILITOT, PROT in the last 168 hours. Recent Labs  Lab 04/17/19 0331  WBC 11.6*  HGB 10.8*  HCT 31.8*  PLT 272

## 2019-04-17 NOTE — Progress Notes (Signed)
Pt c/o CP that she could not differentiate between indigestion and cardiac cause. Pt given 0.4mg  SL NTG 3 doses x 5 minutes with no ease in pain. Provider text-paged for order for EKG. EKG performed. Order also placed for Troponin I to be drawn. Pt says she feels bad. Hasn't had a bowel movement in 5 days. Will pass all this information on to day shift RN. Lajoyce Corners, RN

## 2019-04-17 NOTE — Progress Notes (Addendum)
  Progress Note    04/17/2019 8:50 AM 4 Days Post-Op  Subjective:  Crying and states he stomach hurts and pain in her arm.  She says she hasn't had a BM in 5 days.   afebrile  Vitals:   04/17/19 0635 04/17/19 0640  BP: (!) 165/76 (!) 173/83  Pulse:    Resp: 12 11  Temp:    SpO2:      Physical Exam: Cardiac:  regular Lungs:  Non labored Incisions:  Bloody drainage with penrose in place.  Proximal and distal incisions look good with nylon sutures in place.  Extremities:  Easily palpable left radial pulse.    CBC    Component Value Date/Time   WBC 11.6 (H) 04/17/2019 0331   RBC 3.86 (L) 04/17/2019 0331   HGB 10.8 (L) 04/17/2019 0331   HCT 31.8 (L) 04/17/2019 0331   PLT 272 04/17/2019 0331   MCV 82.4 04/17/2019 0331   MCH 28.0 04/17/2019 0331   MCHC 34.0 04/17/2019 0331   RDW 13.8 04/17/2019 0331   LYMPHSABS 0.5 (L) 03/03/2018 0415   MONOABS 0.6 03/03/2018 0415   EOSABS 0.7 03/03/2018 0415   BASOSABS 0.1 03/03/2018 0415    BMET    Component Value Date/Time   NA 136 04/17/2019 0331   K 3.6 04/17/2019 0331   CL 95 (L) 04/17/2019 0331   CO2 26 04/17/2019 0331   GLUCOSE 138 (H) 04/17/2019 0331   BUN 20 04/17/2019 0331   CREATININE 4.47 (H) 04/17/2019 0331   CREATININE 3.63 (H) 01/11/2018 1148   CALCIUM 10.1 04/17/2019 0331   CALCIUM 9.7 04/21/2017 0231   GFRNONAA 10 (L) 04/17/2019 0331   GFRAA 11 (L) 04/17/2019 0331    INR    Component Value Date/Time   INR 1.2 04/16/2019 0332     Intake/Output Summary (Last 24 hours) at 04/17/2019 0850 Last data filed at 04/17/2019 0202 Gross per 24 hour  Intake 540 ml  Output 650 ml  Net -110 ml     Assessment:  68 y.o. female is s/p:  Removal of infected left upper arm AV Gore-Tex graft and vein patch of left   4 Days Post-Op  Plan: -penrose pulled back ~ 1cm proximally and distally today - anticipate drain out in the next day or two.  Continues to have easily palpable left radial pulse.  Continue daily  dressing change.  Bloody drainage on bandage.  hgb stable. -pt with constipation and abdominal pain.  No BM x 5 days.  May benefit from suppository    Leontine Locket, PA-C [facility] [phone] 04/17/2019 8:50 AM

## 2019-04-17 NOTE — Progress Notes (Addendum)
PROGRESS NOTE    Jocelyn Hill  [id number] DOB: 10/15/1950 DOA: 04/12/2019 PCP: [name], MD   Brief Narrative:  Patient is a68 y.o.femalewith history of ESRD on HD MWF, recent AV fistula infection-apparently on IV antibiotics with HD, mitral valve replacement with bioprosthetic valve and maze procedure in [id number], chronic systolic heart failure, CAD s/p CABG-who was transferred from [facility] for evaluation of left arm AV fistula infection.   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[place]) Active Problems:   Hyperlipidemia, unspecified   Essential hypertension   Hypothyroidism   Depression   Paroxysmal atrial fibrillation (HCC)   Chronic combined systolic (congestive) and diastolic (congestive) heart failure (HCC)   Anemia in chronic kidney disease   Anxiety   S/P CABG x 2   S/P mitral valve replacement with bioprosthetic valve + CABG x2 + maze procedure   S/P Maze operation for atrial fibrillation   ESRD on dialysis ([place])   Stroke ([place])   Type II diabetes mellitus with renal manifestations ([place])   CAD (coronary artery disease)   GERD (gastroesophageal reflux disease)   Cough   Malnutrition of moderate degree   Left upper extremity AV fistula infection: evaluated by vascular surgeryand taken to OR 10/14,drain still in place, Blood cultures 10/13-neG, wound cultureshowing MSSA,narrowed abx in anticipation of d/c,AFB by vasc-pending, patientpreviously hadsome bleeding from operative site, buthas been stable,iinitiated Coumadinper pharm consult 10/16, monitor wound  Chronic combined systolic/diastolic heart failure (EF 30-35% by TTE on October 2019): Volume status stable-diuresis with HD.  PAF: Rate controlled- monitor INR.  Addendum 3 pm10/14 Prior attending spokewith Dr Shary Key to start heparin today-if tolerates well-ok to resume coumadin on 10/15 -Because of bleeding this was held, Coumadin was reinitiated  10/16 with pharmacy consult  History of mitral valve replacement with bioprosthetic valve in 2019with history of A. fib as above: anticoag as above  CAD s/p CABG in 2019: No anginal symptoms. Continue aspirin, statin and beta-blocker.  Hypertension:Controlled-continue hydralazine, Cardura, Coreg and Imdur.  ESRD-HD MWF: Nephrology consulted-defer dialysis to nephrology  Anemia:Appears mild-likely related to ESRD-follow periodically.  Hypothyroidism: Continue with levothyroxine  GERD:Continue PPI  Constipation: dulcolax today  DVT prophylaxis: [id number] woith hep bridge  Code Status: Full    Code Status Orders  (From admission, onward)         Start     Ordered   04/12/19 2241  Full code  Continuous     04/12/19 2242        Code Status History    Date Active Date Inactive Code Status Order ID Comments User Context   04/22/2018 1628 05/01/2018 1814 Full Code [id number]  Steve Rattler, DO Inpatient   02/09/2018 1652 03/05/2018 2215 Full Code [id number]  Georgiana Shore, NP Inpatient   07/03/2017 1411 07/03/2017 2143 Full Code [id number]  Larey Dresser, MD Inpatient   04/12/2017 1837 04/23/2017 2124 Full Code [id number]  Erick Colace, NP Inpatient   03/12/2017 1125 04/04/2017 2032 Full Code [id number]  Annita Brod, MD Inpatient   Advance Care Planning Activity     Family Communication: called daughter, answered all question Patient remained in the hospital with continued monitoring by vascular surgery with drain in place postoperatively.  Because of patient's bleeding will bridge with heparin and Coumadin monitoring hemodynamics and bleeding at surgical site while in the hospital.  Patient is not yet stable for medical discharge Consults called: None Admission status: Inpatient   Consultants:  vasc surg, nephrology  Antimicrobials:   vanc>10/14-10/16  clinda>10/16 -10/17  Ancef > 10/17 with dialysis   Subjective: No acute events  overnight Reports persistent arm pain Reported no bowel movement for 5 days given a Dulcolax  Objective: Vitals:   04/17/19 0542 04/17/19 0635 04/17/19 0640 04/17/19 0720  BP: (!) 165/69 (!) 165/76 (!) 173/83 (!) 153/92  Pulse: 81   87  Resp: 17 12 11 13   Temp: 98.2 F (36.8 C)     TempSrc: Oral     SpO2: 99%   98%  Weight: 49.8 kg     Height:        Intake/Output Summary (Last 24 hours) at 04/17/2019 1134 Last data filed at 04/17/2019 1100 Gross per 24 hour  Intake 460 ml  Output 1050 ml  Net -590 ml   Filed Weights   04/15/19 1110 04/16/19 0447 04/17/19 0542  Weight: 48.9 kg 53 kg 49.8 kg    Examination:  General exam: Appears calm and comfortable  Respiratory system: Clear to auscultation. Respiratory effort normal. Cardiovascular system: S1 & S2 heard, RRR. No JVD, murmurs, rubs, gallops or clicks. No pedal edema. Gastrointestinal system: Abdomen is nondistended, soft and nontender. No organomegaly or masses felt. Normal bowel sounds heard. Central nervous system: Alert and oriented. No focal neurological deficits. Extremities: Warm well perfused neurovascular intact good distal pulses. Skin: No rashes, lesions or ulcers Psychiatry: Judgement and insight appear normal. Mood & affect appropriate.     Data Reviewed: I have personally reviewed following labs and imaging studies  CBC: Recent Labs  Lab 04/14/19 0330 04/15/19 0538 04/15/19 0612 04/16/19 0332 04/17/19 0331  WBC 14.2* 9.9 9.6 8.0 11.6*  HGB 11.4* 10.2* 10.4* 10.5* 10.8*  HCT 32.0* 28.8* 31.6* 30.5* 31.8*  MCV 81.0 81.1 84.9 82.0 82.4  PLT 241 244 236 239 [id number]   Basic Metabolic Panel: Recent Labs  Lab 04/12/19 2247 04/13/19 0849 04/14/19 0330 04/15/19 0612 04/17/19 0331  NA 135 137 135 134* 136  K 4.6 4.5 3.7 4.5 3.6  CL 103 107 100 99 95*  CO2 20* 18* 22 26 26   GLUCOSE 101* 94 98 130* 138*  BUN 31* 32* 14 23 20   CREATININE 4.18* 4.22* 2.56* 4.22* 4.47*  CALCIUM 9.6 9.2 8.8* 9.1  10.1  PHOS  --   --  3.3 4.3  --    GFR: Estimated Creatinine Clearance: 9.6 mL/min (A) (by C-G formula based on SCr of 4.47 mg/dL (H)). Liver Function Tests: Recent Labs  Lab 04/14/19 0330 04/15/19 0612  ALBUMIN 3.7 3.3*   No results for input(s): LIPASE, AMYLASE in the last 168 hours. No results for input(s): AMMONIA in the last 168 hours. Coagulation Profile: Recent Labs  Lab 04/12/19 2247 04/13/19 0849 04/15/19 0538 04/16/19 0332 04/17/19 0838  INR 2.0* 1.7* 1.2 1.2 1.7*   Cardiac Enzymes: No results for input(s): CKTOTAL, CKMB, CKMBINDEX, TROPONINI in the last 168 hours. BNP (last 3 results) No results for input(s): PROBNP in the last 8760 hours. HbA1C: No results for input(s): HGBA1C in the last 72 hours. CBG: Recent Labs  Lab 04/13/19 1347 04/14/19 0642 04/15/19 0621 04/16/19 0602 04/17/19 0613  GLUCAP 97 92 118* 228* 135*   Lipid Profile: No results for input(s): CHOL, HDL, LDLCALC, TRIG, CHOLHDL, LDLDIRECT in the last 72 hours. Thyroid Function Tests: No results for input(s): TSH, T4TOTAL, FREET4, T3FREE, THYROIDAB in the last 72 hours. Anemia Panel: No results for input(s): VITAMINB12, FOLATE, FERRITIN, TIBC, IRON, RETICCTPCT in the last  72 hours. Sepsis Labs: No results for input(s): PROCALCITON, LATICACIDVEN in the last 168 hours.  Recent Results (from the past 240 hour(s))  Culture, blood (Routine X 2) w Reflex to ID Panel     Status: None (Preliminary result)   Collection Time: 04/12/19 10:47 PM   Specimen: BLOOD RIGHT FOREARM  Result Value Ref Range Status   Specimen Description BLOOD RIGHT FOREARM  Final   Special Requests   Final    AEROBIC BOTTLE ONLY Blood Culture results may not be optimal due to an inadequate volume of blood received in culture bottles   Culture   Final    NO GROWTH 4 DAYS Performed at [facility], [place] [address], [place], [place] [address]    Report Status PENDING  Incomplete  Culture, blood (Routine X 2) w  Reflex to ID Panel     Status: None (Preliminary result)   Collection Time: 04/12/19 10:55 PM   Specimen: BLOOD RIGHT HAND  Result Value Ref Range Status   Specimen Description BLOOD RIGHT HAND  Final   Special Requests AEROBIC BOTTLE ONLY Blood Culture adequate volume  Final   Culture   Final    NO GROWTH 4 DAYS Performed at [facility], [place] [address] [place], [place] [address]    Report Status PENDING  Incomplete  Surgical pcr screen     Status: None   Collection Time: 04/13/19  4:06 AM   Specimen: Nasal Mucosa; Nasal Swab  Result Value Ref Range Status   MRSA, PCR NEGATIVE NEGATIVE Final   Staphylococcus aureus NEGATIVE NEGATIVE Final    Comment: (NOTE) The Xpert SA Assay (FDA approved for NASAL specimens in patients 32 years of age and older), is one component of a comprehensive surveillance program. It is not intended to diagnose infection nor to guide or monitor treatment. Performed at [facility], [place] [address] [place], [place] [address]   Aerobic/Anaerobic Culture (surgical/deep wound)     Status: None (Preliminary result)   Collection Time: 04/13/19 12:05 PM   Specimen: PATH Cytology Misc. fluid; Body Fluid  Result Value Ref Range Status   Specimen Description WOUND LEFT UPPER ARM  Final   Special Requests PERIGRAFT SAMPLE A PT ON VANC  Final   Gram Stain   Final    ABUNDANT WBC PRESENT, PREDOMINANTLY PMN MODERATE GRAM POSITIVE COCCI IN CLUSTERS Performed at [facility], [address], [place], [place] [address]    Culture   Final    MODERATE STAPHYLOCOCCUS AUREUS NO ANAEROBES ISOLATED; CULTURE IN PROGRESS FOR 5 DAYS    Report Status PENDING  Incomplete   Organism ID, Bacteria STAPHYLOCOCCUS AUREUS  Final      Susceptibility   Staphylococcus aureus - MIC*    CIPROFLOXACIN <=0.5 SENSITIVE Sensitive     ERYTHROMYCIN 0.5 SENSITIVE Sensitive     GENTAMICIN <=0.5 SENSITIVE Sensitive     OXACILLIN 0.5 SENSITIVE Sensitive     TETRACYCLINE  <=1 SENSITIVE Sensitive     VANCOMYCIN <=0.5 SENSITIVE Sensitive     TRIMETH/SULFA <=10 SENSITIVE Sensitive     CLINDAMYCIN <=0.25 SENSITIVE Sensitive     RIFAMPIN <=0.5 SENSITIVE Sensitive     Inducible Clindamycin NEGATIVE Sensitive     * MODERATE STAPHYLOCOCCUS AUREUS  Acid Fast Smear (AFB)     Status: None   Collection Time: 04/13/19 12:05 PM   Specimen: PATH Cytology Misc. fluid; Body Fluid  Result Value Ref Range Status   AFB Specimen Processing  Concentration  Final   Acid Fast Smear Negative  Final    Comment: (NOTE) Performed At: [facility] [place], [place] [id number] Rush Farmer MD [id number]    Source (AFB) LEFT  Final    Comment: UPPER ARM PERIGRAFT SAMPLE A Performed at [facility], [place] [address] [place], [place] [address]          Radiology Studies: No results found.      Scheduled Meds: . amLODipine  10 mg Oral QHS  . aspirin EC  81 mg Oral QHS  . atorvastatin  80 mg Oral QPM  . buPROPion  100 mg Oral TID  . busPIRone  15 mg Oral BID  . calcitRIOL  0.5 mcg Oral Q M,W,F-HD  . carvedilol  6.25 mg Oral BID  . Chlorhexidine Gluconate Cloth  6 each Topical Q0600  . doxazosin  8 mg Oral QHS  . DULoxetine  30 mg Oral Daily  . feeding supplement (NEPRO CARB STEADY)  237 mL Oral TID BM  . heparin  1,000 Units Intracatheter Q M,W,F-HD  . hydrALAZINE  100 mg Oral TID  . isosorbide mononitrate  120 mg Oral QHS  . levothyroxine  150 mcg Oral QAC breakfast  . multivitamin  1 tablet Oral QHS  . pantoprazole  40 mg Oral Daily  . torsemide  100 mg Oral Daily  . traZODone  100 mg Oral QHS  . vitamin B-12  2,500 mcg Oral QPM  . warfarin  3 mg Oral ONCE-1800  . Warfarin - Pharmacist Dosing Inpatient   Does not apply q1800   Continuous Infusions: . sodium chloride 10 mL/hr at 04/13/19 1043  . [START ON 04/18/2019]  ceFAZolin (ANCEF) IV    . heparin 900 Units/hr (04/16/19 1142)     LOS: 5 days    Time spent: 35 min     Nicolette Bang, MD [facility]  If 7PM-7AM, please contact night-coverage  04/17/2019, 11:34 AM

## 2019-04-18 DIAGNOSIS — E1122 Type 2 diabetes mellitus with diabetic chronic kidney disease: Secondary | ICD-10-CM

## 2019-04-18 DIAGNOSIS — T827XXD Infection and inflammatory reaction due to other cardiac and vascular devices, implants and grafts, subsequent encounter: Secondary | ICD-10-CM

## 2019-04-18 DIAGNOSIS — Z8679 Personal history of other diseases of the circulatory system: Secondary | ICD-10-CM

## 2019-04-18 DIAGNOSIS — F329 Major depressive disorder, single episode, unspecified: Secondary | ICD-10-CM

## 2019-04-18 DIAGNOSIS — I6330 Cerebral infarction due to thrombosis of unspecified cerebral artery: Secondary | ICD-10-CM

## 2019-04-18 DIAGNOSIS — E440 Moderate protein-calorie malnutrition: Secondary | ICD-10-CM

## 2019-04-18 DIAGNOSIS — Z9889 Other specified postprocedural states: Secondary | ICD-10-CM

## 2019-04-18 DIAGNOSIS — I10 Essential (primary) hypertension: Secondary | ICD-10-CM

## 2019-04-18 DIAGNOSIS — E785 Hyperlipidemia, unspecified: Secondary | ICD-10-CM

## 2019-04-18 LAB — CULTURE, BLOOD (ROUTINE X 2)
Culture: NO GROWTH
Culture: NO GROWTH
Special Requests: ADEQUATE

## 2019-04-18 LAB — PROTIME-INR
INR: 2.4 — ABNORMAL HIGH (ref 0.8–1.2)
Prothrombin Time: 25.5 seconds — ABNORMAL HIGH (ref 11.4–15.2)

## 2019-04-18 LAB — CBC
HCT: 30.9 % — ABNORMAL LOW (ref 36.0–46.0)
Hemoglobin: 10.5 g/dL — ABNORMAL LOW (ref 12.0–15.0)
MCH: 28.2 pg (ref 26.0–34.0)
MCHC: 34 g/dL (ref 30.0–36.0)
MCV: 82.8 fL (ref 80.0–100.0)
Platelets: 287 10*3/uL (ref 150–400)
RBC: 3.73 MIL/uL — ABNORMAL LOW (ref 3.87–5.11)
RDW: 13.6 % (ref 11.5–15.5)
WBC: 12.6 10*3/uL — ABNORMAL HIGH (ref 4.0–10.5)
nRBC: 0.2 % (ref 0.0–0.2)

## 2019-04-18 LAB — BASIC METABOLIC PANEL
Anion gap: 12 (ref 5–15)
BUN: 25 mg/dL — ABNORMAL HIGH (ref 8–23)
CO2: 26 mmol/L (ref 22–32)
Calcium: 10.4 mg/dL — ABNORMAL HIGH (ref 8.9–10.3)
Chloride: 97 mmol/L — ABNORMAL LOW (ref 98–111)
Creatinine, Ser: 5.28 mg/dL — ABNORMAL HIGH (ref 0.44–1.00)
GFR calc Af Amer: 9 mL/min — ABNORMAL LOW (ref >60)
GFR calc non Af Amer: 8 mL/min — ABNORMAL LOW (ref >60)
Glucose, Bld: 117 mg/dL — ABNORMAL HIGH (ref 70–99)
Potassium: 3.5 mmol/L (ref 3.5–5.1)
Sodium: 135 mmol/L (ref 135–145)

## 2019-04-18 LAB — AEROBIC/ANAEROBIC CULTURE W GRAM STAIN (SURGICAL/DEEP WOUND)

## 2019-04-18 LAB — GLUCOSE, CAPILLARY: Glucose-Capillary: 137 mg/dL — ABNORMAL HIGH (ref 70–99)

## 2019-04-18 LAB — HEPARIN LEVEL (UNFRACTIONATED): Heparin Unfractionated: 0.42 IU/mL (ref 0.30–0.70)

## 2019-04-18 MED ORDER — CALCITRIOL 0.5 MCG PO CAPS
ORAL_CAPSULE | ORAL | Status: AC
  Filled 2019-04-18: qty 1

## 2019-04-18 MED ORDER — HEPARIN SODIUM (PORCINE) 1000 UNIT/ML IJ SOLN
INTRAMUSCULAR | Status: AC
  Filled 2019-04-18: qty 6

## 2019-04-18 MED ORDER — OXYCODONE-ACETAMINOPHEN 5-325 MG PO TABS
1.0000 | ORAL_TABLET | Freq: Once | ORAL | Status: AC
  Administered 2019-04-18: 08:00:00 1 via ORAL
  Filled 2019-04-18: qty 1

## 2019-04-18 MED ORDER — POLYETHYLENE GLYCOL 3350 17 G PO PACK
17.0000 g | PACK | Freq: Once | ORAL | Status: AC
  Administered 2019-04-18: 17 g via ORAL
  Filled 2019-04-18: qty 1

## 2019-04-18 NOTE — Progress Notes (Signed)
[facility] for Coumadin / Heparin  Indication: atrial fibrillation  Allergies  Allergen Reactions  . Ace Inhibitors Anaphylaxis and Swelling  . Motrin Ib [Ibuprofen] Anaphylaxis    Patient Measurements: Height: 5\' 9"  (175.3 cm) Weight: 108 lb 14.5 oz (49.4 kg) IBW/kg (Calculated) : 66.2 Heparin Dosing Weight: 54 kg  Vital Signs: Temp: 97.8 F (36.6 C) (10/19 0459) Temp Source: Oral (10/19 0459) BP: 159/82 (10/19 0459) Pulse Rate: 73 (10/19 0459)  Labs: Recent Labs    04/16/19 0332 04/17/19 0331 04/17/19 0838 04/17/19 1030 04/18/19 0448  HGB 10.5* 10.8*  --   --  10.5*  HCT 30.5* 31.8*  --   --  30.9*  PLT 239 272  --   --  287  LABPROT 15.5*  --  19.9*  --  25.5*  INR 1.2  --  1.7*  --  2.4*  HEPARINUNFRC 0.34  --  0.32  --  0.42  CREATININE  --  4.47*  --   --  5.28*  TROPONINIHS  --   --  66* 57*  --     Estimated Creatinine Clearance: 8.1 mL/min (A) (by C-G formula based on SCr of 5.28 mg/dL (H)).   Assessment:  Anticoag: Coumadin PTA for afib and mitral valve replacement with bioprosthetic mitral valve . INR 2.0 on admission (10/13). IV heparin to start post-op. S/p AV graft on 10/14. Bleeding from operative site has stabilized and stopped. 1 x dose on 10/14 (0043) Vit K 2mg  IV. PTA dose of warfarin is 3mg  daily  Heparin level therapeutic, INR up to 2.4 CBC stable  Goal of Therapy:  Heparin level 2-3 INR 2-3 Monitor platelets by anticoagulation protocol: Yes   Plan:  - Continue heparin at 900 units/hour  - Hold warfarin today - Daily HL and CBC and INR - monitor signs of bleeding   -NOTE: previous HLs have fluctuated likely due to inability to get accurate blood draw.  Heparin is infusing in right arm and left arm is restricted.   Thank you,  Anette Guarneri, PharmD   Please check amion for clinical pharmacist contact number  04/18/2019

## 2019-04-18 NOTE — Progress Notes (Signed)
PROGRESS NOTE  Jocelyn Hill [id number] DOB: 28-Jul-1950 DOA: 04/12/2019 PCP: Jocelyn Rings, MD  Brief History   Patient is a58 y.o.femalewith history of ESRD on HD MWF, recent AV fistula infection-apparently on IV antibiotics with HD, mitral valve replacement with bioprosthetic valve and maze procedure in [id number], chronic systolic heart failure, CAD s/p CABG-who was transferred from [facility] for evaluation of left arm AV fistula infection.  The patient has undergone surgical removal of infectied left upper arm AV Gore-Tex graft and vein patch of left upper extremity. She is 5 days post operative. Today she had the penrose drain removed by vascular surgery and wet to dry dressings placed.   Consultants  . Nephrology . Vascular surgery  Procedures  surgical removal of infectied left upper arm AV Gore-Tex graft   Antibiotics   Anti-infectives (From admission, onward)   Start     Dose/Rate Route Frequency Ordered Stop   04/18/19 1200  ceFAZolin (ANCEF) IVPB 2g/100 mL premix     2 g 200 mL/hr over 30 Minutes Intravenous Every M-W-F (Hemodialysis) 04/16/19 1612     04/17/19 1430  ceFAZolin (ANCEF) IVPB 1 g/50 mL premix     1 g 100 mL/hr over 30 Minutes Intravenous  Once 04/17/19 1428 04/17/19 1727   04/15/19 1400  clindamycin (CLEOCIN) IVPB 600 mg  Status:  Discontinued     600 mg 100 mL/hr over 30 Minutes Intravenous Every 8 hours 04/15/19 1232 04/16/19 1601   04/13/19 1200  vancomycin (VANCOCIN) IVPB 500 mg/100 ml premix  Status:  Discontinued     500 mg 100 mL/hr over 60 Minutes Intravenous Every M-W-F (Hemodialysis) 04/13/19 0128 04/16/19 1000    .  Subjective  The patient underwent HD today. She is complaining of abdominal pain and nausea. She states that she can't keep anything down. She has not had a BM for over a week.  Objective   Vitals:  Vitals:   04/18/19 1454 04/18/19 1738  BP: (!) 158/80 (!) 160/89  Pulse:    Resp:    Temp:    SpO2:     Exam:   Constitutional:  . The patient is awake, alert, and oriented x 3. Moderate distress from nausea and abdominal pain. Respiratory:  . No increased work of breathing. . No wheezes, rales, or rhonchi . No tactile fremitus Cardiovascular:  . Regular rate and rhythm . No murmurs, ectopy, or gallups. . No lateral PMI. No thrills. Abdomen:  . Abdomen is soft, non-tender, non-distended . No hernias, masses, or organomegaly . Normoactive bowel sounds.  Musculoskeletal:  . No cyanosis, clubbing, or edema . Left upper extremity is bandaged. Skin:  . No rashes, lesions, ulcers . palpation of skin: no induration or nodules Neurologic:  . CN 2-12 intact . Sensation all 4 extremities intact Psychiatric:  . Mental status o Mood, affect appropriate o Orientation to person, place, time  . judgment and insight appear intact   I have personally reviewed the following:   Today's Data  . Vitals, BMP, CBC  Scheduled Meds: . amLODipine  10 mg Oral QHS  . aspirin EC  81 mg Oral QHS  . atorvastatin  80 mg Oral QPM  . buPROPion  100 mg Oral TID  . busPIRone  15 mg Oral BID  . calcitRIOL  0.5 mcg Oral Q M,W,F-HD  . carvedilol  6.25 mg Oral BID  . Chlorhexidine Gluconate Cloth  6 each Topical Q0600  . Chlorhexidine Gluconate Cloth  6 each Topical Q0600  .  doxazosin  8 mg Oral QHS  . DULoxetine  30 mg Oral Daily  . feeding supplement (NEPRO CARB STEADY)  237 mL Oral TID BM  . heparin  1,000 Units Intracatheter Q M,W,F-HD  . hydrALAZINE  100 mg Oral TID  . isosorbide mononitrate  120 mg Oral QHS  . levothyroxine  150 mcg Oral QAC breakfast  . multivitamin  1 tablet Oral QHS  . pantoprazole  40 mg Oral Daily  . traZODone  100 mg Oral QHS  . vitamin B-12  2,500 mcg Oral QPM  . Warfarin - Pharmacist Dosing Inpatient   Does not apply q1800   Continuous Infusions: .  ceFAZolin (ANCEF) IV 2 g (04/18/19 1148)  . heparin 900 Units/hr (04/17/19 1535)    Principal Problem:   AV fistula  infection ([place]) Active Problems:   Hyperlipidemia, unspecified   Essential hypertension   Hypothyroidism   Depression   Paroxysmal atrial fibrillation (HCC)   Chronic combined systolic (congestive) and diastolic (congestive) heart failure (HCC)   Anemia in chronic kidney disease   Anxiety   S/P CABG x 2   S/P mitral valve replacement with bioprosthetic valve + CABG x2 + maze procedure   S/P Maze operation for atrial fibrillation   ESRD on dialysis ([place])   Stroke ([place])   Type II diabetes mellitus with renal manifestations (HCC)   CAD (coronary artery disease)   GERD (gastroesophageal reflux disease)   Cough   Malnutrition of moderate degree   LOS: 6 days  Left upper extremity AV fistula infection: evaluated by vascular surgeryand taken to OR 10/14,drain still in place,Blood cultures 10/13-neG, wound cultureshowingMSSA,narrowed abx in anticipation of d/c,AFB by vasc-pending, patientpreviously hadsome bleeding from operative site, buthas been stable,iinitiatedCoumadinper pharm consult10/16, monitor wound. Penrose drain out.   Chronic combined systolic/diastolic heart failure (EF 30-35% by TTE on October 2019): Volume status stable-diuresis with HD. HD today with removal of 1300 cc.  PAF: Rate controlled-monitorINR. Coumadin as per pharmacy  History of mitral valve replacement with bioprosthetic valve in 2019with history of A. fib as above: Coumadin as per pharmacy  CAD s/p CABG in 2019: No anginal symptoms. Continue aspirin, statin and beta-blocker.  Hypertension:Controlled-continue hydralazine, Cardura, Coreg and Imdur.  ESRD-HD MWF: Nephrology consulted-defer dialysis to nephrology  Anemia:Appears mild-likely related to ESRD-follow periodically.  Hypothyroidism: Continue with levothyroxine  GERD:Continue PPI  Constipation: Soap suds enema and lactulose.  I have seen and examined this patient myself. I have spent 32 minutes in her evaluation  and care.  DVT prophylaxis: [id number] with hep bridge  Code Status: Full code Family Communication: None available Disposition: tbd

## 2019-04-18 NOTE — Progress Notes (Signed)
[facility] Progress Note  Subjective: still no BM, did not get miralax yest afternoon, have reordered for today after HD, will give enema next if this doesn't work  Vitals:   04/18/19 1100 04/18/19 1130 04/18/19 1200 04/18/19 1219  BP: (!) 144/77 (!) 146/79 (!) 137/92 (!) 160/86  Pulse: 80 81 85 86  Resp:    20  Temp:    98.2 F (36.8 C)  TempSrc:    Oral  SpO2:    98%  Weight:    50 kg  Height:        Inpatient medications: . amLODipine  10 mg Oral QHS  . aspirin EC  81 mg Oral QHS  . atorvastatin  80 mg Oral QPM  . buPROPion  100 mg Oral TID  . busPIRone  15 mg Oral BID  . calcitRIOL  0.5 mcg Oral Q M,W,F-HD  . carvedilol  6.25 mg Oral BID  . Chlorhexidine Gluconate Cloth  6 each Topical Q0600  . Chlorhexidine Gluconate Cloth  6 each Topical Q0600  . doxazosin  8 mg Oral QHS  . DULoxetine  30 mg Oral Daily  . feeding supplement (NEPRO CARB STEADY)  237 mL Oral TID BM  . heparin  1,000 Units Intracatheter Q M,W,F-HD  . hydrALAZINE  100 mg Oral TID  . isosorbide mononitrate  120 mg Oral QHS  . levothyroxine  150 mcg Oral QAC breakfast  . multivitamin  1 tablet Oral QHS  . pantoprazole  40 mg Oral Daily  . polyethylene glycol  17 g Oral Once  . traZODone  100 mg Oral QHS  . vitamin B-12  2,500 mcg Oral QPM  . Warfarin - Pharmacist Dosing Inpatient   Does not apply q1800   .  ceFAZolin (ANCEF) IV 2 g (04/18/19 1148)  . heparin 900 Units/hr (04/17/19 1535)   acetaminophen, albuterol, ALPRAZolam, alum & mag hydroxide-simeth, bisacodyl, dextromethorphan-guaiFENesin, diphenhydrAMINE, hydrALAZINE, morphine injection, nitroGLYCERIN, ondansetron (ZOFRAN) IV, oxyCODONE-acetaminophen    Exam: General: Thin female, NAD. On room air. Heart: RRR; no murmur Lungs: CTAB Abdomen: soft, mild generalized tenderness without guarding or rebound Extremities: No LE edema, minimal muscle mass, LUA in ace wrap Dialysis Access: R femoral tunneled cath    Dialysis:  MWF [facility]  3.5h  400/800   56.5kg  3K/2.25 bath  LUA AVG (now removed)/ TDC new   Hep none - Calcitriol 0.52mcg PO q HD - Mircera 13mcg IV q 4 weeks (last 9/24)  Assessment/Plan: 1. 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 S/p excision 10/14 - wound Cx +MSSA. Clinda IV now switched to Ancef, complete 10 days from surgery through 10/24.  2. ESRD: HD MWF sched. Using same fem [facility] which was present on admission. BCx's neg here. HD today 3. HTN/volume: BP slightly high, 6kg under dry wt, lower dry at dc. No vol on  Exam. Keep even.  4. Anemia: Hgb 10.4 - follow. 5. Secondary hyperparathyroidism: Ca/Phos ok. Continue VDRA. No binders listed. 6. Nutrition: Alb low - continue Nepro supplements. 7. CAD (Hx CABG 2019) 8. A-fib/ bio-MVR/Hx MAZE procedure: Warfarin remains on hold s/p LUE oozing. On heparin. 9. Hypothyroidism: On replacement 10. Consipation: wrote for miralax,  OOB, etc   Rob Nahara Dona 04/18/2019, 1:05 PM  Iron/TIBC/Ferritin/ %Sat    Component Value Date/Time   IRON 18 (L) 04/18/2018 0229   TIBC 224 (L) 04/18/2018 0229   FERRITIN 98 04/17/2018 0942   IRONPCTSAT 8 (L) 04/18/2018 [id number]  Recent Labs  Lab 04/15/19 0612  04/18/19 0448  NA 134*   < > 135  K 4.5   < > 3.5  CL 99   < > 97*  CO2 26   < > 26  GLUCOSE 130*   < > 117*  BUN 23   < > 25*  CREATININE 4.22*   < > 5.28*  CALCIUM 9.1   < > 10.4*  PHOS 4.3  --   --   ALBUMIN 3.3*  --   --   INR  --    < > 2.4*   < > = values in this interval not displayed.   No results for input(s): AST, ALT, ALKPHOS, BILITOT, PROT in the last 168 hours. Recent Labs  Lab 04/18/19 0448  WBC 12.6*  HGB 10.5*  HCT 30.9*  PLT 287

## 2019-04-18 NOTE — Progress Notes (Signed)
  Progress Note    04/18/2019 8:00 AM 5 Days Post-Op  Subjective:  Feels a little better  afebrile  Vitals:   04/17/19 1915 04/18/19 0459  BP: (!) 151/84 (!) 159/82  Pulse: 77 73  Resp: 16 16  Temp: 98.2 F (36.8 C) 97.8 F (36.6 C)  SpO2: 100% 99%    Physical Exam: Incisions:  Bloody drainage with penrose removal. Incision looks clean with no evidence of infection. Extremities:  3+ palpable radial pulse left   CBC    Component Value Date/Time   WBC 12.6 (H) 04/18/2019 0448   RBC 3.73 (L) 04/18/2019 0448   HGB 10.5 (L) 04/18/2019 0448   HCT 30.9 (L) 04/18/2019 0448   PLT 287 04/18/2019 0448   MCV 82.8 04/18/2019 0448   MCH 28.2 04/18/2019 0448   MCHC 34.0 04/18/2019 0448   RDW 13.6 04/18/2019 0448   LYMPHSABS 0.5 (L) 03/03/2018 0415   MONOABS 0.6 03/03/2018 0415   EOSABS 0.7 03/03/2018 0415   BASOSABS 0.1 03/03/2018 0415    BMET    Component Value Date/Time   NA 135 04/18/2019 0448   K 3.5 04/18/2019 0448   CL 97 (L) 04/18/2019 0448   CO2 26 04/18/2019 0448   GLUCOSE 117 (H) 04/18/2019 0448   BUN 25 (H) 04/18/2019 0448   CREATININE 5.28 (H) 04/18/2019 0448   CREATININE 3.63 (H) 01/11/2018 1148   CALCIUM 10.4 (H) 04/18/2019 0448   CALCIUM 9.7 04/21/2017 0231   GFRNONAA 8 (L) 04/18/2019 0448   GFRAA 9 (L) 04/18/2019 0448    INR    Component Value Date/Time   INR 2.4 (H) 04/18/2019 0448     Intake/Output Summary (Last 24 hours) at 04/18/2019 0800 Last data filed at 04/18/2019 0518 Gross per 24 hour  Intake 250 ml  Output 1605 ml  Net -1355 ml     Assessment:  68 y.o. female is s/p:  Removal of infected left upper arm AV Gore-Tex graft and vein patch of left   5 Days Post-Op  Plan: -penrose removed today and wet to dry dressing placed.  Will start bid wet to dry saline dressing changes.  -pt abdominal pain better but still no BM with suppository    Leontine Locket, PA-C [facility] [phone] 04/18/2019  8:00 AM

## 2019-04-19 LAB — BASIC METABOLIC PANEL
Anion gap: 12 (ref 5–15)
BUN: 16 mg/dL (ref 8–23)
CO2: 24 mmol/L (ref 22–32)
Calcium: 10 mg/dL (ref 8.9–10.3)
Chloride: 100 mmol/L (ref 98–111)
Creatinine, Ser: 3.37 mg/dL — ABNORMAL HIGH (ref 0.44–1.00)
GFR calc Af Amer: 16 mL/min — ABNORMAL LOW (ref >60)
GFR calc non Af Amer: 13 mL/min — ABNORMAL LOW (ref >60)
Glucose, Bld: 118 mg/dL — ABNORMAL HIGH (ref 70–99)
Potassium: 3.7 mmol/L (ref 3.5–5.1)
Sodium: 136 mmol/L (ref 135–145)

## 2019-04-19 LAB — CBC
HCT: 29.9 % — ABNORMAL LOW (ref 36.0–46.0)
Hemoglobin: 10.4 g/dL — ABNORMAL LOW (ref 12.0–15.0)
MCH: 29 pg (ref 26.0–34.0)
MCHC: 34.8 g/dL (ref 30.0–36.0)
MCV: 83.3 fL (ref 80.0–100.0)
Platelets: 261 10*3/uL (ref 150–400)
RBC: 3.59 MIL/uL — ABNORMAL LOW (ref 3.87–5.11)
RDW: 14 % (ref 11.5–15.5)
WBC: 14 10*3/uL — ABNORMAL HIGH (ref 4.0–10.5)
nRBC: 0 % (ref 0.0–0.2)

## 2019-04-19 LAB — PROTIME-INR
INR: 2.3 — ABNORMAL HIGH (ref 0.8–1.2)
Prothrombin Time: 24.8 seconds — ABNORMAL HIGH (ref 11.4–15.2)

## 2019-04-19 LAB — HEPARIN LEVEL (UNFRACTIONATED): Heparin Unfractionated: 0.52 IU/mL (ref 0.30–0.70)

## 2019-04-19 LAB — GLUCOSE, CAPILLARY: Glucose-Capillary: 104 mg/dL — ABNORMAL HIGH (ref 70–99)

## 2019-04-19 MED ORDER — LACTULOSE 10 GM/15ML PO SOLN
10.0000 g | Freq: Three times a day (TID) | ORAL | Status: DC
  Administered 2019-04-19: 10 g via ORAL
  Filled 2019-04-19 (×3): qty 15

## 2019-04-19 MED ORDER — WARFARIN SODIUM 3 MG PO TABS
3.0000 mg | ORAL_TABLET | Freq: Once | ORAL | Status: AC
  Administered 2019-04-19: 3 mg via ORAL
  Filled 2019-04-19: qty 1

## 2019-04-19 MED ORDER — BISACODYL 10 MG RE SUPP: 10.0000 mg | Freq: Every day | RECTAL | Status: DC

## 2019-04-19 NOTE — Progress Notes (Signed)
[facility] Progress Note  Subjective: no new c/o  Vitals:   04/18/19 2042 04/19/19 0353 04/19/19 0500 04/19/19 0759  BP: (!) 149/84 (!) 152/86  (!) 149/86  Pulse: 81 80  76  Resp: 14 16    Temp: 98.2 F (36.8 C) 98.2 F (36.8 C)    TempSrc: Oral Oral    SpO2: 97% 97%    Weight:   52.5 kg   Height:        Inpatient medications: . amLODipine  10 mg Oral QHS  . aspirin EC  81 mg Oral QHS  . atorvastatin  80 mg Oral QPM  . buPROPion  100 mg Oral TID  . busPIRone  15 mg Oral BID  . calcitRIOL  0.5 mcg Oral Q M,W,F-HD  . carvedilol  6.25 mg Oral BID  . Chlorhexidine Gluconate Cloth  6 each Topical Q0600  . Chlorhexidine Gluconate Cloth  6 each Topical Q0600  . doxazosin  8 mg Oral QHS  . DULoxetine  30 mg Oral Daily  . feeding supplement (NEPRO CARB STEADY)  237 mL Oral TID BM  . heparin  1,000 Units Intracatheter Q M,W,F-HD  . hydrALAZINE  100 mg Oral TID  . isosorbide mononitrate  120 mg Oral QHS  . lactulose  10 g Oral TID  . levothyroxine  150 mcg Oral QAC breakfast  . multivitamin  1 tablet Oral QHS  . pantoprazole  40 mg Oral Daily  . traZODone  100 mg Oral QHS  . vitamin B-12  2,500 mcg Oral QPM  . warfarin  3 mg Oral ONCE-1800  . Warfarin - Pharmacist Dosing Inpatient   Does not apply q1800   .  ceFAZolin (ANCEF) IV Stopped (04/18/19 1218)  . heparin 900 Units/hr (04/19/19 0308)   acetaminophen, albuterol, ALPRAZolam, alum & mag hydroxide-simeth, bisacodyl, dextromethorphan-guaiFENesin, diphenhydrAMINE, hydrALAZINE, morphine injection, nitroGLYCERIN, ondansetron (ZOFRAN) IV, oxyCODONE-acetaminophen    Exam: General: Thin female, NAD. On room air. Heart: RRR; no murmur Lungs: CTAB Abdomen: soft, mild generalized tenderness without guarding or rebound Extremities: No LE edema, minimal muscle mass, LUA in ace wrap Dialysis Access: R femoral tunneled cath    Dialysis: MWF [facility]  3.5h  400/800   56.5kg  3K/2.25 bath  LUA AVG (now removed)/  TDC new   Hep none - Calcitriol 0.53mcg PO q HD - Mircera 48mcg IV q 4 weeks (last 9/24)  Assessment/Plan: 1. 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 S/p excision 10/14 - wound Cx +MSSA. Clinda IV now switched to Ancef, complete 10 days from surgery through 10/24.  2. ESRD: HD MWF sched. Using same fem [facility] which was present on admission. BCx's neg here. If pt still here tomorrow and ready for dc she said she would to her OP unit (at noon).  3. HTN/volume: BP slightly high, 6kg under dry wt, lower dry at dc. No vol on  Exam. Keep even.  4. Anemia: Hgb 10.4 - follow. 5. Secondary hyperparathyroidism: Ca/Phos ok. Continue VDRA. No binders listed. 6. Nutrition: Alb low - continue Nepro supplements. 7. CAD (Hx CABG 2019) 8. A-fib/ bio-MVR/Hx MAZE procedure: Warfarin remains on hold s/p LUE oozing. On heparin. 9. Hypothyroidism: On replacement 10. Consipation: per primary 11. Dispo - OK for dc from renal standpoint   Rob Oskar Cretella 04/19/2019, 2:42 PM  Iron/TIBC/Ferritin/ %Sat    Component Value Date/Time   IRON 18 (L) 04/18/2018 0229   TIBC 224 (L) 04/18/2018 0229   FERRITIN  98 04/17/2018 0942   IRONPCTSAT 8 (L) 04/18/2018 0229   Recent Labs  Lab 04/15/19 0612  04/19/19 0339  NA 134*   < > 136  K 4.5   < > 3.7  CL 99   < > 100  CO2 26   < > 24  GLUCOSE 130*   < > 118*  BUN 23   < > 16  CREATININE 4.22*   < > 3.37*  CALCIUM 9.1   < > 10.0  PHOS 4.3  --   --   ALBUMIN 3.3*  --   --   INR  --    < > 2.3*   < > = values in this interval not displayed.   No results for input(s): AST, ALT, ALKPHOS, BILITOT, PROT in the last 168 hours. Recent Labs  Lab 04/19/19 0339  WBC 14.0*  HGB 10.4*  HCT 29.9*  PLT 261

## 2019-04-19 NOTE — Progress Notes (Addendum)
  Progress Note    04/19/2019 7:44 AM 6 Days Post-Op  Subjective:  Feels better today  afebrile  Vitals:   04/18/19 2042 04/19/19 0353  BP: (!) 149/84 (!) 152/86  Pulse: 81 80  Resp: 14 16  Temp: 98.2 F (36.8 C) 98.2 F (36.8 C)  SpO2: 97% 97%    Physical Exam: Incisions:  Wound is clean without evidence of infection   CBC    Component Value Date/Time   WBC 14.0 (H) 04/19/2019 0339   RBC 3.59 (L) 04/19/2019 0339   HGB 10.4 (L) 04/19/2019 0339   HCT 29.9 (L) 04/19/2019 0339   PLT 261 04/19/2019 0339   MCV 83.3 04/19/2019 0339   MCH 29.0 04/19/2019 0339   MCHC 34.8 04/19/2019 0339   RDW 14.0 04/19/2019 0339   LYMPHSABS 0.5 (L) 03/03/2018 0415   MONOABS 0.6 03/03/2018 0415   EOSABS 0.7 03/03/2018 0415   BASOSABS 0.1 03/03/2018 0415    BMET    Component Value Date/Time   NA 136 04/19/2019 0339   K 3.7 04/19/2019 0339   CL 100 04/19/2019 0339   CO2 24 04/19/2019 0339   GLUCOSE 118 (H) 04/19/2019 0339   BUN 16 04/19/2019 0339   CREATININE 3.37 (H) 04/19/2019 0339   CREATININE 3.63 (H) 01/11/2018 1148   CALCIUM 10.0 04/19/2019 0339   CALCIUM 9.7 04/21/2017 0231   GFRNONAA 13 (L) 04/19/2019 0339   GFRAA 16 (L) 04/19/2019 0339    INR    Component Value Date/Time   INR 2.3 (H) 04/19/2019 0339     Intake/Output Summary (Last 24 hours) at 04/19/2019 0744 Last data filed at 04/19/2019 0308 Gross per 24 hour  Intake 500.52 ml  Output 650 ml  Net -149.48 ml     Assessment:  68 y.o. female is s/p:  Removal of infected left upper arm AV Gore-Tex graft and vein patch of left   6 Days Post-Op  Plan: -wound bed clean-continue wet to dry saline dressing changes -will need HH for dressing changes.     Leontine Locket, PA-C [facility] [phone] 04/19/2019 7:44 AM  I have examined the patient, reviewed and agree with above.  Wound continues to improve.  Stable for discharge from vascular surgery standpoint with home  health normal saline wet-to-dry to open area in mid upper arm daily  Curt Jews, MD 04/19/2019 7:53 AM

## 2019-04-19 NOTE — Progress Notes (Addendum)
[place] for Coumadin / Heparin  Indication: atrial fibrillation  Allergies  Allergen Reactions  . Ace Inhibitors Anaphylaxis and Swelling  . Motrin Ib [Ibuprofen] Anaphylaxis    Patient Measurements: Height: 5\' 9"  (175.3 cm) Weight: 115 lb 11.9 oz (52.5 kg) IBW/kg (Calculated) : 66.2 Heparin Dosing Weight: 54 kg  Vital Signs: Temp: 98.2 F (36.8 C) (10/20 0353) Temp Source: Oral (10/20 0353) BP: 149/86 (10/20 0759) Pulse Rate: 76 (10/20 0759)  Labs: Recent Labs    04/17/19 0331 04/17/19 0838 04/17/19 1030 04/18/19 0448 04/19/19 0339  HGB 10.8*  --   --  10.5* 10.4*  HCT 31.8*  --   --  30.9* 29.9*  PLT 272  --   --  287 261  LABPROT  --  19.9*  --  25.5* 24.8*  INR  --  1.7*  --  2.4* 2.3*  HEPARINUNFRC  --  0.32  --  0.42 0.52  CREATININE 4.47*  --   --  5.28* 3.37*  TROPONINIHS  --  66* 57*  --   --     Estimated Creatinine Clearance: 13.4 mL/min (A) (by C-G formula based on SCr of 3.37 mg/dL (H)).   Assessment:  Anticoag: Coumadin PTA for afib and mitral valve replacement with bioprosthetic mitral valve . INR 2.0 on admission (10/13). IV heparin to start post-op. S/p AV graft on 10/14. Bleeding from operative site has stabilized and stopped. 1 x dose on 10/14 (0043) Vit K 2mg  IV. PTA dose of warfarin is 3mg  daily  Heparin level therapeutic, INR 2.3 CBC stable  Goal of Therapy:  Heparin level 0.3-0.7 INR 2-3 Monitor platelets by anticoagulation protocol: Yes   Plan:  - Continue heparin at 900 units/hour  - Warfarin 3 mg po x 1 tonight - Daily HL and CBC and INR - monitor signs of bleeding   -NOTE: previous HLs have fluctuated likely due to inability to get accurate blood draw.  Heparin is infusing in right arm and left arm is restricted.   Thank you,  Anette Guarneri, PharmD   Please check amion for clinical pharmacist contact number  04/19/2019

## 2019-04-19 NOTE — Progress Notes (Signed)
Pt with no bowel movement for over a week, per pt. Offered dulcolax to pt. Pt refused. Pt educated.

## 2019-04-19 NOTE — Progress Notes (Signed)
Pt had large BM. Denies needs at this time. Will continue to monitor.

## 2019-04-19 NOTE — Progress Notes (Signed)
PROGRESS NOTE  Jocelyn Hill [id number] DOB: Apr 30, 1951 DOA: 04/12/2019 PCP: [name], MD  Brief History   Patient is a51 y.o.femalewith history of ESRD on HD MWF, recent AV fistula infection-apparently on IV antibiotics with HD, mitral valve replacement with bioprosthetic valve and maze procedure in [id number], chronic systolic heart failure, CAD s/p CABG-who was transferred from [facility] for evaluation of left arm AV fistula infection.  The patient has undergone surgical removal of infectied left upper arm AV Gore-Tex graft and vein patch of left upper extremity. She is 5 days post operative. Today she had the penrose drain removed by vascular surgery and wet to dry dressings placed.   Pt has severe constipation. She has not had a BM for over a week. She has declined enemas. Currently she is receiving lactulose, miralax, and bisacodyl suppositories.  Consultants  . Nephrology . Vascular surgery  Procedures  surgical removal of infectied left upper arm AV Gore-Tex graft   Antibiotics   Anti-infectives (From admission, onward)   Start     Dose/Rate Route Frequency Ordered Stop   04/18/19 1200  ceFAZolin (ANCEF) IVPB 2g/100 mL premix     2 g 200 mL/hr over 30 Minutes Intravenous Every M-W-F (Hemodialysis) 04/16/19 1612     04/17/19 1430  ceFAZolin (ANCEF) IVPB 1 g/50 mL premix     1 g 100 mL/hr over 30 Minutes Intravenous  Once 04/17/19 1428 04/17/19 1727   04/15/19 1400  clindamycin (CLEOCIN) IVPB 600 mg  Status:  Discontinued     600 mg 100 mL/hr over 30 Minutes Intravenous Every 8 hours 04/15/19 1232 04/16/19 1601   04/13/19 1200  vancomycin (VANCOCIN) IVPB 500 mg/100 ml premix  Status:  Discontinued     500 mg 100 mL/hr over 60 Minutes Intravenous Every M-W-F (Hemodialysis) 04/13/19 0128 04/16/19 1000     Subjective  The patient is without new complaints. She still has not had a BM. She is refusing enemas.  Objective   Vitals:  Vitals:   04/19/19 0353  04/19/19 0759  BP: (!) 152/86 (!) 149/86  Pulse: 80 76  Resp: 16   Temp: 98.2 F (36.8 C)   SpO2: 97%    Exam:  Constitutional:  . The patient is awake, alert, and oriented x 3. Mild distress from nausea and abdominal pain. Respiratory:  . No increased work of breathing. . No wheezes, rales, or rhonchi . No tactile fremitus Cardiovascular:  . Regular rate and rhythm . No murmurs, ectopy, or gallups. . No lateral PMI. No thrills. Abdomen:  . Abdomen is soft, non-tender, non-distended . No hernias, masses, or organomegaly . Hyperactive bowel sounds.  Musculoskeletal:  . No cyanosis, clubbing, or edema . Left upper extremity is bandaged. Skin:  . No rashes, lesions, ulcers . palpation of skin: no induration or nodules Neurologic:  . CN 2-12 intact . Sensation all 4 extremities intact Psychiatric:  . Mental status o Mood, affect appropriate o Orientation to person, place, time  . judgment and insight appear intact  I have personally reviewed the following:   Today's Data  . Vitals, BMP, CBC  Scheduled Meds: . amLODipine  10 mg Oral QHS  . aspirin EC  81 mg Oral QHS  . atorvastatin  80 mg Oral QPM  . bisacodyl  10 mg Rectal Daily  . buPROPion  100 mg Oral TID  . busPIRone  15 mg Oral BID  . calcitRIOL  0.5 mcg Oral Q M,W,F-HD  . carvedilol  6.25 mg  Oral BID  . Chlorhexidine Gluconate Cloth  6 each Topical Q0600  . Chlorhexidine Gluconate Cloth  6 each Topical Q0600  . doxazosin  8 mg Oral QHS  . DULoxetine  30 mg Oral Daily  . feeding supplement (NEPRO CARB STEADY)  237 mL Oral TID BM  . heparin  1,000 Units Intracatheter Q M,W,F-HD  . hydrALAZINE  100 mg Oral TID  . isosorbide mononitrate  120 mg Oral QHS  . lactulose  10 g Oral TID  . levothyroxine  150 mcg Oral QAC breakfast  . multivitamin  1 tablet Oral QHS  . pantoprazole  40 mg Oral Daily  . traZODone  100 mg Oral QHS  . vitamin B-12  2,500 mcg Oral QPM  . warfarin  3 mg Oral ONCE-1800  . Warfarin  - Pharmacist Dosing Inpatient   Does not apply q1800   Continuous Infusions: .  ceFAZolin (ANCEF) IV Stopped (04/18/19 1218)  . heparin 900 Units/hr (04/19/19 0308)    Principal Problem:   AV fistula infection (Jocelyn Hill) Active Problems:   Hyperlipidemia, unspecified   Essential hypertension   Hypothyroidism   Depression   Paroxysmal atrial fibrillation (HCC)   Chronic combined systolic (congestive) and diastolic (congestive) heart failure (HCC)   Anemia in chronic kidney disease   Anxiety   S/P CABG x 2   S/P mitral valve replacement with bioprosthetic valve + CABG x2 + maze procedure   S/P Maze operation for atrial fibrillation   ESRD on dialysis (Jocelyn Hill)   Stroke (Jocelyn Hill)   Type II diabetes mellitus with renal manifestations (HCC)   CAD (coronary artery disease)   GERD (gastroesophageal reflux disease)   Cough   Malnutrition of moderate degree   LOS: 7 days  Left upper extremity AV fistula infection: evaluated by vascular surgeryand taken to OR 10/14,drain still in place,Blood cultures 10/13-neG, wound cultureshowingMSSA,narrowed abx in anticipation of d/c,AFB by vasc-pending, patientpreviously hadsome bleeding from operative site, buthas been stable. InitiatedCoumadinper pharm consult10/16, monitor wound. Penrose drain out.   Chronic combined systolic/diastolic heart failure (EF 30-35% by TTE on October 2019): Volume status stable-diuresis with HD. HD today with removal of 1300 cc.  PAF: Rate controlled-monitorINR. Coumadin as per pharmacy  History of mitral valve replacement with bioprosthetic valve in 2019with history of A. fib as above: Coumadin as per pharmacy.  CAD s/p CABG in 2019: No anginal symptoms. Continue aspirin, statin and beta-blocker.  Hypertension:Controlled-continue hydralazine, Cardura, Coreg and Imdur.  ESRD-HD MWF: Nephrology consulted-defer dialysis to nephrology  Anemia:Appears mild-likely related to ESRD-follow periodically.   Hypothyroidism: Continue with levothyroxine  GERD:Continue PPI  Constipation: Soap suds enema and lactulose ordered, but patient has refused enemas. Bisacodyl suppositories added. Pt will be discharge when she has a BM.  I have seen and examined this patient myself. I have spent 30 minutes in her evaluation and care.  DVT prophylaxis: [id number] with hep bridge  Code Status: Full code Family Communication: None available Disposition: tbd

## 2019-04-20 LAB — CBC
HCT: 29.2 % — ABNORMAL LOW (ref 36.0–46.0)
Hemoglobin: 10.3 g/dL — ABNORMAL LOW (ref 12.0–15.0)
MCH: 29.1 pg (ref 26.0–34.0)
MCHC: 35.3 g/dL (ref 30.0–36.0)
MCV: 82.5 fL (ref 80.0–100.0)
Platelets: 270 10*3/uL (ref 150–400)
RBC: 3.54 MIL/uL — ABNORMAL LOW (ref 3.87–5.11)
RDW: 13.9 % (ref 11.5–15.5)
WBC: 13.5 10*3/uL — ABNORMAL HIGH (ref 4.0–10.5)
nRBC: 0 % (ref 0.0–0.2)

## 2019-04-20 LAB — GLUCOSE, CAPILLARY: Glucose-Capillary: 99 mg/dL (ref 70–99)

## 2019-04-20 LAB — BASIC METABOLIC PANEL
Anion gap: 10 (ref 5–15)
BUN: 25 mg/dL — ABNORMAL HIGH (ref 8–23)
CO2: 23 mmol/L (ref 22–32)
Calcium: 10.4 mg/dL — ABNORMAL HIGH (ref 8.9–10.3)
Chloride: 101 mmol/L (ref 98–111)
Creatinine, Ser: 4.34 mg/dL — ABNORMAL HIGH (ref 0.44–1.00)
GFR calc Af Amer: 11 mL/min — ABNORMAL LOW (ref >60)
GFR calc non Af Amer: 10 mL/min — ABNORMAL LOW (ref >60)
Glucose, Bld: 111 mg/dL — ABNORMAL HIGH (ref 70–99)
Potassium: 4.4 mmol/L (ref 3.5–5.1)
Sodium: 134 mmol/L — ABNORMAL LOW (ref 135–145)

## 2019-04-20 LAB — PROTIME-INR
INR: 2.2 — ABNORMAL HIGH (ref 0.8–1.2)
Prothrombin Time: 24.3 seconds — ABNORMAL HIGH (ref 11.4–15.2)

## 2019-04-20 LAB — HEPARIN LEVEL (UNFRACTIONATED): Heparin Unfractionated: 0.46 IU/mL (ref 0.30–0.70)

## 2019-04-20 MED ORDER — GABAPENTIN 300 MG PO CAPS: 300.0000 mg | ORAL_CAPSULE | ORAL | 1 refills | Status: AC

## 2019-04-20 MED ORDER — HYDRALAZINE HCL 100 MG PO TABS: 100.0000 mg | ORAL_TABLET | Freq: Three times a day (TID) | ORAL | 0 refills | Status: DC

## 2019-04-20 MED ORDER — BUPROPION HCL 100 MG PO TABS: 100.0000 mg | ORAL_TABLET | Freq: Three times a day (TID) | ORAL | 0 refills | Status: DC

## 2019-04-20 MED ORDER — HYDROMORPHONE HCL 1 MG/ML PO LIQD: 2.0000 mg | ORAL | Status: DC | PRN

## 2019-04-20 MED ORDER — HYDROMORPHONE HCL 2 MG PO TABS: 2.0000 mg | ORAL_TABLET | ORAL | 0 refills | Status: AC | PRN

## 2019-04-20 MED ORDER — ONDANSETRON HCL 4 MG PO TABS: 8.0000 mg | ORAL_TABLET | Freq: Three times a day (TID) | ORAL | Status: DC | PRN

## 2019-04-20 MED ORDER — DIPHENHYDRAMINE HCL 25 MG PO TABS: 25.0000 mg | ORAL_TABLET | Freq: Every day | ORAL | 0 refills | Status: AC | PRN

## 2019-04-20 MED ORDER — ISOSORBIDE MONONITRATE ER 120 MG PO TB24: 120.0000 mg | ORAL_TABLET | Freq: Every day | ORAL | 0 refills | Status: AC

## 2019-04-20 MED ORDER — WARFARIN SODIUM 3 MG PO TABS: 3.0000 mg | ORAL_TABLET | Freq: Every day | ORAL | Status: DC

## 2019-04-20 MED ORDER — WARFARIN SODIUM 3 MG PO TABS: 3.0000 mg | ORAL_TABLET | Freq: Every day | ORAL | 0 refills | Status: AC

## 2019-04-20 MED ORDER — HEPARIN SODIUM (PORCINE) 1000 UNIT/ML IJ SOLN: 1000.0000 [IU] | INTRAMUSCULAR | Status: AC

## 2019-04-20 MED ORDER — HYDROMORPHONE HCL 2 MG PO TABS
2.0000 mg | ORAL_TABLET | ORAL | Status: DC | PRN
  Administered 2019-04-20: 2 mg via ORAL
  Filled 2019-04-20: qty 1

## 2019-04-20 MED ORDER — DM-GUAIFENESIN ER 30-600 MG PO TB12: 1.0000 | ORAL_TABLET | Freq: Two times a day (BID) | ORAL | 0 refills | Status: AC | PRN

## 2019-04-20 MED ORDER — CEFAZOLIN SODIUM-DEXTROSE 2-4 GM/100ML-% IV SOLN: 2.0000 g | INTRAVENOUS | Status: DC

## 2019-04-20 MED ORDER — CARVEDILOL 6.25 MG PO TABS: 6.2500 mg | ORAL_TABLET | Freq: Two times a day (BID) | ORAL | 0 refills | Status: DC

## 2019-04-20 MED ORDER — CALCITRIOL 0.5 MCG PO CAPS: 0.5000 ug | ORAL_CAPSULE | ORAL | Status: AC

## 2019-04-20 NOTE — Progress Notes (Addendum)
Patient given discharge instructions medication list and follow up appointments Patient verbalized understanding. IV and tele were dcd. Dressing change to left arm was done. Will discharge home as ordered. Transported to exit via wheel chair with nursing staff. Samanta Gal, Bettina Gavia RN

## 2019-04-20 NOTE — Progress Notes (Signed)
Patient cleared for discharge today HD day. Renal Navigator met with patient to discuss plan for discharge before HD and to return to OP HD clinic for dialysis today. She is concerned about having HD today because she reports having diarrhea. Renal Navigator spoke with Dr. Schertz/Nephrology who states he is ok with patient going to HD tomorrow instead of today. Renal Navigator explained to patient that she will still need to go to her regular appointment on Friday. She states understanding and agreement. Patient reports that her daughter will pick her up from the hospital today and is the one who transports her to OP HD clinic. Patient cleared for discharge from an OP HD/Nephrology standpoint.  ,  Elizabeth, LCSW Renal Navigator [phone] 

## 2019-04-20 NOTE — TOC Transition Note (Addendum)
Transition of Care [facility]) - CM/SW Discharge Note Marvetta Gibbons RN, BSN Transitions of Care Unit 4E- RN Case Manager [phone]   Patient Details  Name: Jocelyn Hill MRN: [id number] Date of Birth: Nov 17, 1950  Transition of Care [facility]) CM/SW Contact:  Dawayne Patricia, RN Phone Number: 04/20/2019, 2:45 PM   Clinical Narrative:    Pt stable for transition home today, order written for [facility] for wound care, CM spoke with pt at bedside- list provided for choice Per CMS guidelines from medicare.gov website with star ratings (copy placed in shadow chart)- per pt she does not have a preference- would prefer one with high star ratings so will contact those first and then work down the list- pt agreeable. Pt also requesting a 3n1- requested order from MD. Daughter to provide transportation- and per renal navigator plan will be for pt to go to HD tomorrow and Friday and not today. Call made to [facility] with [employer] for DME need- 3n1 to be delivered to room prior to discharge- calls made to multiple [facility] agencies for [facility] needs-  [facility]- no  [facility]- [facility]- no [facility]- no [facility]- no [facility]- has accepted- first available [place] date- 10/27- may start sooner if able [facility] [phone]- spoke with Caryl Pina)  Update-1450- call made to pt post discharge to let her know [facility] arrangements   Final next level of care: Home w Home Health Services Barriers to Discharge: No Barriers Identified   Patient Goals and CMS Choice Patient states their goals for this hospitalization and ongoing recovery are:: to feel better and stronger CMS Medicare.gov Compare Post Acute Care list provided to:: Patient Choice offered to / list presented to : Patient  Discharge Placement             Home with [facility]          Discharge Plan and Services     Post Acute Care Choice: Durable Medical Equipment, Home Health          DME Arranged: 3-N-1 DME Agency: [employer] Date DME Agency Contacted:  04/20/19 Time DME Agency Contacted: [phone] Representative spoke with at DME Agency: [place]: RN [place] Agency: [place] Date [place]: 04/20/19 Time [place]: [place] Representative spoke with at [place]: [place] ([place]) Interventions     Readmission Risk Interventions Readmission Risk Prevention Plan 04/20/2019  Transportation Screening Complete  Medication Review [employer]) Complete  PCP or Specialist appointment within 3-5 days of discharge Complete  HRI or [place] Complete  SW Recovery Care/Counseling Consult Complete  [place] Not Applicable

## 2019-04-20 NOTE — Progress Notes (Signed)
[place] for Coumadin Indication: atrial fibrillation  Allergies  Allergen Reactions  . Ace Inhibitors Anaphylaxis and Swelling  . Motrin Ib [Ibuprofen] Anaphylaxis    Patient Measurements: Height: 5\' 9"  (175.3 cm) Weight: 108 lb 11 oz (49.3 kg) IBW/kg (Calculated) : 66.2 Heparin Dosing Weight: 54 kg  Vital Signs: Temp: 98.2 F (36.8 C) (10/21 0335) Temp Source: Oral (10/21 0335) BP: 145/77 (10/21 0335) Pulse Rate: 61 (10/21 0335)  Labs: Recent Labs    04/17/19 0838 04/17/19 1030  04/18/19 0448 04/19/19 0339 04/20/19 0328  HGB  --   --    < > 10.5* 10.4* 10.3*  HCT  --   --   --  30.9* 29.9* 29.2*  PLT  --   --   --  287 261 270  LABPROT 19.9*  --   --  25.5* 24.8* 24.3*  INR 1.7*  --   --  2.4* 2.3* 2.2*  HEPARINUNFRC 0.32  --   --  0.42 0.52 0.46  CREATININE  --   --   --  5.28* 3.37* 4.34*  TROPONINIHS 66* 57*  --   --   --   --    < > = values in this interval not displayed.    Estimated Creatinine Clearance: 9.8 mL/min (A) (by C-G formula based on SCr of 4.34 mg/dL (H)).   Assessment:  Anticoag: Coumadin PTA for afib and mitral valve replacement with bioprosthetic mitral valve . INR 2.0 on admission (10/13). IV heparin to start post-op. S/p AV graft on 10/14. Bleeding from operative site has stabilized and stopped. 1 x dose on 10/14 (0043) Vit K 2mg  IV. PTA dose of warfarin is 3mg  daily  INR 2.2 CBC stable  Goal of Therapy:  INR 2-3 Monitor platelets by anticoagulation protocol: Yes   Plan:  Stop heparin drip -> INR stable  Warfarin 3 mg po daily  -NOTE: previous HLs have fluctuated likely due to inability to get accurate blood draw.  Heparin is infusing in right arm and left arm is restricted.   Thank you,  Anette Guarneri, PharmD   Please check amion for clinical pharmacist contact number  04/20/2019

## 2019-04-20 NOTE — Care Management Important Message (Signed)
Important Message  Patient Details  Name: Jocelyn Hill MRN: [id number] Date of Birth: 01-05-1951   Medicare Important Message Given:  Yes     Shelda Altes 04/20/2019, 11:40 AM

## 2019-04-21 ENCOUNTER — Telehealth: Payer: Self-pay | Admitting: Nephrology

## 2019-04-21 NOTE — Discharge Summary (Signed)
Physician Discharge Summary  DARRI RHOTEN [id number] DOB: 1950/09/10 DOA: 04/12/2019  PCP: [name], MD  Admit date: 04/12/2019 Discharge date: 04/21/2019  Admitted From: Home Disposition:  Home  Recommendations for Outpatient Follow-up:  1. Follow up with PCP in 1-2 weeks 2. Please obtain BMP/CBC in one week 3. Please follow up on the following pending results:  Home Health: Yes, [place] RN for wound care Equipment/Devices: none  Discharge Condition: stable CODE STATUS: Full Diet recommendation: Renal  Brief/Interim Summary:  Patient is a68 y.o.femalewith history of ESRD on HD MWF, recent AV fistula infection-apparently on IV antibiotics with HD, mitral valve replacement with bioprosthetic valve and maze procedure in [id number], chronic systolic heart failure, CAD s/p CABG-who was transferred from [facility] for evaluation of left arm AV fistula infection. The patient has undergone surgical removal of infected left upper arm AV Gore-Tex graft and vein patch of left upper extremity. She is 6 days post operative. Penrose drain removed by vascular surgery 10/21 and wet to dry dressings placed.  Blood cultures of 10/13 negative.  Wound culture grew MSSA, antibiotics were de-escalated. Per nephrology, on discharge, patient would receive Ancef IV with dialysis and did not require oral antibiotics.  Patient received dialysis while admitted with stable fluid status subsequently.  Pt has had severe constipation. She has not had a BM for over a week. She has declined enemas. Currently she is receiving lactulose, miralax, and bisacodyl suppositories.  Did report as large BM this morning.   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[place]) Active Problems:   Hyperlipidemia, unspecified   Essential hypertension   Hypothyroidism   Depression   Paroxysmal atrial fibrillation (HCC)   Chronic combined systolic (congestive) and diastolic (congestive) heart failure  (HCC)   Anemia in chronic kidney disease   Anxiety   S/P CABG x 2   S/P mitral valve replacement with bioprosthetic valve + CABG x2 + maze procedure   S/P Maze operation for atrial fibrillation   ESRD on dialysis ([place])   Stroke ([place])   Type II diabetes mellitus with renal manifestations (HCC)   CAD (coronary artery disease)   GERD (gastroesophageal reflux disease)   Cough   Malnutrition of moderate degree    Discharge Instructions please do not miss dialysis.  - Home health to do wet-to-dry dressing changes.  Discharge Instructions    Activity as tolerated   Complete by: As directed    Avoid straining   Complete by: As directed    Bring all medications to your doctor's appointment   Complete by: As directed    CALL 911 for chest discomfort not relieved by NTG or lasting longer than 20 minutes   Complete by: As directed    Call MD for:  redness, tenderness, or signs of infection (pain, swelling, redness, odor or green/yellow discharge around incision site)   Complete by: As directed    Call MD for:  temperature >100.4   Complete by: As directed    Call doctor for chest discomfort that is more frequent or severe   Complete by: As directed    Call doctor for fainting or near blackouts   Complete by: As directed    Call doctor for more than 3-4 kg weight gain between hemodialysis treatments   Complete by: As directed    Call doctor for shortness of breath, with or without a dry hacking cough   Complete by: As directed    Call doctor for swellling in the  hands, feet or stomach not improved after hemodialysis   Complete by: As directed    Call doctor if you have to sleep on extra pillows at night in order to breathe   Complete by: As directed    Continue to follow the Renal Care Notes you received during your hospital stay   Complete by: As directed    Diet - low sodium heart healthy   Complete by: As directed    Diet renal with fluid restriction   Complete by: As directed     Discharge instructions   Complete by: As directed    Take Zofran (ondansetron) with pain medication to prevent nausea.   Do not skip any hemodialysis appointments unless directed by your doctor   Complete by: As directed    Eat 4-5 small meals rather than 3 heavy meals per day   Complete by: As directed    Increase activity slowly   Complete by: As directed    Record daily weight on same scale at same time of day after urinating and before breakfast   Complete by: As directed    STOP ANY ACTIVITY THAT CAUSES CHEST PAIN, SHORTNESS OF BREATH, DIZZINESS, SWEATING OR EXCESSIVE WEAKNESS   Complete by: As directed    Wound care instructions   Complete by: As directed    Home health nurse to do wet-to-dry dressing changes of left upper extremity surgical site.     Allergies as of 04/20/2019      Reactions   Ace Inhibitors Anaphylaxis, Swelling   Motrin Ib [ibuprofen] Anaphylaxis      Medication List    TAKE these medications   acetaminophen 500 MG tablet Commonly known as: TYLENOL Take 1,500-2,000 mg by mouth 2 (two) times daily as needed for moderate pain or headache.   albuterol 108 (90 Base) MCG/ACT inhaler Commonly known as: VENTOLIN HFA Inhale 2 puffs into the lungs every 6 (six) hours as needed for wheezing or shortness of breath.   ALPRAZolam 0.25 MG tablet Commonly known as: XANAX Take 1 tablet (0.25 mg total) by mouth at bedtime as needed for anxiety.   amLODipine 10 MG tablet Commonly known as: NORVASC Take 10 mg by mouth at bedtime.   aspirin 81 MG EC tablet Take 1 tablet (81 mg total) by mouth daily. What changed: when to take this   atorvastatin 80 MG tablet Commonly known as: LIPITOR Take 1 tablet (80 mg total) by mouth daily. What changed: when to take this   b complex vitamins tablet Take 1 tablet by mouth at bedtime.   buPROPion 100 MG tablet Commonly known as: WELLBUTRIN Take 1 tablet (100 mg total) by mouth 3 (three) times daily. What changed:  when to take this   busPIRone 15 MG tablet Commonly known as: BUSPAR Take 1 tablet (15 mg total) by mouth 2 (two) times daily.   calcitRIOL 0.5 MCG capsule Commonly known as: ROCALTROL Take 1 capsule (0.5 mcg total) by mouth every Monday, Wednesday, and Friday with hemodialysis.   carvedilol 6.25 MG tablet Commonly known as: COREG Take 1 tablet (6.25 mg total) by mouth 2 (two) times daily. What changed:   medication strength  how much to take   ceFAZolin 2-4 GM/100ML-% IVPB Commonly known as: ANCEF Inject 100 mLs (2 g total) into the vein every Monday, Wednesday, and Friday with hemodialysis.   cholecalciferol 1000 units tablet Commonly known as: VITAMIN D Take 1,000 Units by mouth at bedtime.   CLEAR EYES OP Place 1  drop into both eyes daily as needed (dryness).   dextromethorphan-guaiFENesin 30-600 MG 12hr tablet Commonly known as: MUCINEX DM Take 1 tablet by mouth 2 (two) times daily as needed for cough.   diphenhydrAMINE 25 MG tablet Commonly known as: BENADRYL Take 1 tablet (25 mg total) by mouth daily as needed for itching. What changed: how much to take   doxazosin 4 MG tablet Commonly known as: CARDURA Take 4-8 mg by mouth See admin instructions. Take one tablet (4 mg) by mouth every morning and two tablets (8 mg) at night   DULoxetine 30 MG capsule Commonly known as: CYMBALTA Take 30 mg by mouth at bedtime.   feeding supplement (NEPRO CARB STEADY) Liqd Take 237 mLs by mouth 3 (three) times daily between meals. What changed: when to take this   gabapentin 300 MG capsule Commonly known as: NEURONTIN Take 1 capsule (300 mg total) by mouth 3 (three) times a week. After dialysis on M/W/F What changed:   when to take this  additional instructions   heparin 1000 UNIT/ML injection 1 mL (1,000 Units total) by Intracatheter route every Monday, Wednesday, and Friday with hemodialysis.   hydrALAZINE 100 MG tablet Commonly known as: APRESOLINE Take 1  tablet (100 mg total) by mouth 3 (three) times daily. What changed: when to take this   HYDROmorphone 2 MG tablet Commonly known as: DILAUDID Take 1 tablet (2 mg total) by mouth every 4 (four) hours as needed for up to 5 days for severe pain.   isosorbide mononitrate 120 MG 24 hr tablet Commonly known as: IMDUR Take 1 tablet (120 mg total) by mouth at bedtime. What changed:   when to take this  Another medication with the same name was removed. Continue taking this medication, and follow the directions you see here.   levothyroxine 150 MCG tablet Commonly known as: SYNTHROID Take 150 mcg by mouth daily before breakfast.   montelukast 10 MG tablet Commonly known as: SINGULAIR Take 1 tablet (10 mg total) by mouth daily. What changed: when to take this   multivitamin Tabs tablet Take 1 tablet by mouth at bedtime.   nitroGLYCERIN 0.4 MG SL tablet Commonly known as: NITROSTAT Place 0.4 mg under the tongue every 5 (five) minutes as needed for chest pain.   ondansetron 4 MG tablet Commonly known as: ZOFRAN Take 4 mg by mouth every 6 (six) hours as needed for nausea or vomiting.   pantoprazole 40 MG tablet Commonly known as: PROTONIX Take 1 tablet (40 mg total) by mouth 2 (two) times daily before a meal. What changed: when to take this   polyethylene glycol 17 g packet Commonly known as: MIRALAX / GLYCOLAX Take 17 g by mouth daily. What changed:   when to take this  reasons to take this   tiZANidine 4 MG tablet Commonly known as: ZANAFLEX Take 4 mg by mouth every 8 (eight) hours as needed for muscle spasms.   traZODone 100 MG tablet Commonly known as: DESYREL Take 100 mg by mouth at bedtime.   warfarin 3 MG tablet Commonly known as: COUMADIN Take 1 tablet (3 mg total) by mouth daily at 6 PM. What changed: when to take this      Follow-up Information    Early, Arvilla Meres, MD In 3 weeks.   Specialties: Vascular Surgery, Cardiology Why: Office will call you to  arrange your appt (sent) Contact information: [address] [place] [address] [phone]        [place] Follow  up.   Why: 3n1 arranged- to be delivered to room prior to discharge         Allergies  Allergen Reactions  . Ace Inhibitors Anaphylaxis and Swelling  . Motrin Ib [Ibuprofen] Anaphylaxis    Consultations:  Vascular surgery  Nephrology   Procedures/Studies:  No results found.   Surgical excision of infected left upper extremity AV graft   Subjective: patient seen awake laying in bed. No acute events reported overnight.  Denies fever/chills.  Constipation resolved this AM with large BM.  No abdmoinal pain, N/V/D, chest pain or SOB.   Discharge Exam: Vitals:   04/20/19 0335 04/20/19 0933  BP: (!) 145/77 (!) 143/87  Pulse: 61 72  Resp:    Temp: 98.2 F (36.8 C)   SpO2: 95% 96%   Vitals:   04/19/19 2157 04/20/19 0335 04/20/19 0458 04/20/19 0933  BP: (!) 148/87 (!) 145/77  (!) 143/87  Pulse: 80 61  72  Resp: 17     Temp: 98.2 F (36.8 C) 98.2 F (36.8 C)    TempSrc: Oral Oral    SpO2: 98% 95%  96%  Weight:   49.3 kg   Height:        General: Pt is alert, awake, not in acute distress Cardiovascular: RRR, S1/S2 +, no rubs, no gallops Respiratory: CTA bilaterally, no wheezing, no rhonchi Abdominal: Soft, NT, ND, bowel sounds + Extremities: left upper extremity dressing clean dry and intact, no edema, no cyanosis    The results of significant diagnostics from this hospitalization (including imaging, microbiology, ancillary and laboratory) are listed below for reference.     Microbiology: Recent Results (from the past 240 hour(s))  Culture, blood (Routine X 2) w Reflex to ID Panel     Status: None   Collection Time: 04/12/19 10:47 PM   Specimen: BLOOD RIGHT FOREARM  Result Value Ref Range Status   Specimen Description BLOOD RIGHT FOREARM  Final   Special Requests   Final    AEROBIC BOTTLE ONLY Blood Culture results may not  be optimal due to an inadequate volume of blood received in culture bottles   Culture   Final    NO GROWTH 5 DAYS Performed at [facility], [place] [address] [place], [place] [address]    Report Status 04/18/2019 FINAL  Final  Culture, blood (Routine X 2) w Reflex to ID Panel     Status: None   Collection Time: 04/12/19 10:55 PM   Specimen: BLOOD RIGHT HAND  Result Value Ref Range Status   Specimen Description BLOOD RIGHT HAND  Final   Special Requests AEROBIC BOTTLE ONLY Blood Culture adequate volume  Final   Culture   Final    NO GROWTH 5 DAYS Performed at [facility], [place] [address], [place], [place] [address]    Report Status 04/18/2019 FINAL  Final  Surgical pcr screen     Status: None   Collection Time: 04/13/19  4:06 AM   Specimen: Nasal Mucosa; Nasal Swab  Result Value Ref Range Status   MRSA, PCR NEGATIVE NEGATIVE Final   Staphylococcus aureus NEGATIVE NEGATIVE Final    Comment: (NOTE) The Xpert SA Assay (FDA approved for NASAL specimens in patients 64 years of age and older), is one component of a comprehensive surveillance program. It is not intended to diagnose infection nor to guide or monitor treatment. Performed at [facility], [place] [address] [place], [place] [address]   Fungus Culture With Stain  Status: None (Preliminary result)   Collection Time: 04/13/19 12:05 PM   Specimen: PATH Cytology Misc. fluid; Body Fluid  Result Value Ref Range Status   Fungus Stain Final report  Final    Comment: (NOTE) Performed At: [facility] [place], [place] [id number] Rush Farmer MD [id number]    Fungus (Mycology) Culture PENDING  Incomplete   Fungal Source LEFT  Final    Comment: UPPER ARM PERIGRAFT SAMPLE A Performed at [facility], [place] [address] [place], [place] [address]   Aerobic/Anaerobic Culture (surgical/deep wound)     Status: None   Collection Time: 04/13/19 12:05 PM   Specimen: PATH Cytology  Misc. fluid; Body Fluid  Result Value Ref Range Status   Specimen Description WOUND LEFT UPPER ARM  Final   Special Requests PERIGRAFT SAMPLE A PT ON VANC  Final   Gram Stain   Final    ABUNDANT WBC PRESENT, PREDOMINANTLY PMN MODERATE GRAM POSITIVE COCCI IN CLUSTERS    Culture   Final    MODERATE STAPHYLOCOCCUS AUREUS NO ANAEROBES ISOLATED Performed at [facility], [address] [place], [place] [address]    Report Status 04/18/2019 FINAL  Final   Organism ID, Bacteria STAPHYLOCOCCUS AUREUS  Final      Susceptibility   Staphylococcus aureus - MIC*    CIPROFLOXACIN <=0.5 SENSITIVE Sensitive     ERYTHROMYCIN 0.5 SENSITIVE Sensitive     GENTAMICIN <=0.5 SENSITIVE Sensitive     OXACILLIN 0.5 SENSITIVE Sensitive     TETRACYCLINE <=1 SENSITIVE Sensitive     VANCOMYCIN <=0.5 SENSITIVE Sensitive     TRIMETH/SULFA <=10 SENSITIVE Sensitive     CLINDAMYCIN <=0.25 SENSITIVE Sensitive     RIFAMPIN <=0.5 SENSITIVE Sensitive     Inducible Clindamycin NEGATIVE Sensitive     * MODERATE STAPHYLOCOCCUS AUREUS  Acid Fast Smear (AFB)     Status: None   Collection Time: 04/13/19 12:05 PM   Specimen: PATH Cytology Misc. fluid; Body Fluid  Result Value Ref Range Status   AFB Specimen Processing Concentration  Final   Acid Fast Smear Negative  Final    Comment: (NOTE) Performed At: [facility] [place], [place] [id number] Rush Farmer MD [id number]    Source (AFB) LEFT  Final    Comment: UPPER ARM PERIGRAFT SAMPLE A Performed at [facility], [place] [address] [place] [address] [place], [place] [address]   Fungus Culture Result     Status: None   Collection Time: 04/13/19 12:05 PM  Result Value Ref Range Status   Result 1 Comment  Final    Comment: (NOTE) KOH/Calcofluor preparation:  no fungus observed. Performed At: [facility] [place], [place] [id number] Rush Farmer MD [id number]      Labs: BNP (last 3 results) No results for  input(s): BNP in the last 8760 hours. Basic Metabolic Panel: Recent Labs  Lab 04/15/19 0612 04/17/19 0331 04/18/19 0448 04/19/19 0339 04/20/19 0328  NA 134* 136 135 136 134*  K 4.5 3.6 3.5 3.7 4.4  CL 99 95* 97* 100 101  CO2 26 26 26 24 23   GLUCOSE 130* 138* 117* 118* 111*  BUN 23 20 25* 16 25*  CREATININE 4.22* 4.47* 5.28* 3.37* 4.34*  CALCIUM 9.1 10.1 10.4* 10.0 10.4*  PHOS 4.3  --   --   --   --    Liver Function Tests: Recent Labs  Lab 04/15/19 0612  ALBUMIN 3.3*   No results for  input(s): LIPASE, AMYLASE in the last 168 hours. No results for input(s): AMMONIA in the last 168 hours. CBC: Recent Labs  Lab 04/16/19 0332 04/17/19 0331 04/18/19 0448 04/19/19 0339 04/20/19 0328  WBC 8.0 11.6* 12.6* 14.0* 13.5*  HGB 10.5* 10.8* 10.5* 10.4* 10.3*  HCT 30.5* 31.8* 30.9* 29.9* 29.2*  MCV 82.0 82.4 82.8 83.3 82.5  PLT 239 272 287 261 270   Cardiac Enzymes: No results for input(s): CKTOTAL, CKMB, CKMBINDEX, TROPONINI in the last 168 hours. BNP: Invalid input(s): POCBNP CBG: Recent Labs  Lab 04/16/19 0602 04/17/19 0613 04/18/19 0629 04/19/19 0645 04/20/19 0635  GLUCAP 228* 135* 137* 104* 99   D-Dimer No results for input(s): DDIMER in the last 72 hours. Hgb A1c No results for input(s): HGBA1C in the last 72 hours. Lipid Profile No results for input(s): CHOL, HDL, LDLCALC, TRIG, CHOLHDL, LDLDIRECT in the last 72 hours. Thyroid function studies No results for input(s): TSH, T4TOTAL, T3FREE, THYROIDAB in the last 72 hours.  Invalid input(s): FREET3 Anemia work up No results for input(s): VITAMINB12, FOLATE, FERRITIN, TIBC, IRON, RETICCTPCT in the last 72 hours. Urinalysis    Component Value Date/Time   COLORURINE YELLOW 02/17/2018 0007   APPEARANCEUR CLEAR 02/17/2018 0007   LABSPEC 1.006 02/17/2018 0007   PHURINE 7.0 02/17/2018 0007   GLUCOSEU NEGATIVE 02/17/2018 0007   HGBUR NEGATIVE 02/17/2018 0007   BILIRUBINUR NEGATIVE 02/17/2018 0007   KETONESUR  NEGATIVE 02/17/2018 0007   PROTEINUR 100 (A) 02/17/2018 0007   NITRITE NEGATIVE 02/17/2018 0007   LEUKOCYTESUR NEGATIVE 02/17/2018 0007   Sepsis Labs Invalid input(s): PROCALCITONIN,  WBC,  LACTICIDVEN Microbiology Recent Results (from the past 240 hour(s))  Culture, blood (Routine X 2) w Reflex to ID Panel     Status: None   Collection Time: 04/12/19 10:47 PM   Specimen: BLOOD RIGHT FOREARM  Result Value Ref Range Status   Specimen Description BLOOD RIGHT FOREARM  Final   Special Requests   Final    AEROBIC BOTTLE ONLY Blood Culture results may not be optimal due to an inadequate volume of blood received in culture bottles   Culture   Final    NO GROWTH 5 DAYS Performed at [facility], [place] [address] [place], [place] [address]    Report Status 04/18/2019 FINAL  Final  Culture, blood (Routine X 2) w Reflex to ID Panel     Status: None   Collection Time: 04/12/19 10:55 PM   Specimen: BLOOD RIGHT HAND  Result Value Ref Range Status   Specimen Description BLOOD RIGHT HAND  Final   Special Requests AEROBIC BOTTLE ONLY Blood Culture adequate volume  Final   Culture   Final    NO GROWTH 5 DAYS Performed at [facility], [place] [address] [place], [place] [address]    Report Status 04/18/2019 FINAL  Final  Surgical pcr screen     Status: None   Collection Time: 04/13/19  4:06 AM   Specimen: Nasal Mucosa; Nasal Swab  Result Value Ref Range Status   MRSA, PCR NEGATIVE NEGATIVE Final   Staphylococcus aureus NEGATIVE NEGATIVE Final    Comment: (NOTE) The Xpert SA Assay (FDA approved for NASAL specimens in patients 55 years of age and older), is one component of a comprehensive surveillance program. It is not intended to diagnose infection nor to guide or monitor treatment. Performed at [facility], [place] [address], [place], [place] [address]   Fungus Culture With Stain  Status: None (Preliminary result)   Collection Time: 04/13/19 12:05 PM   Specimen: PATH  Cytology Misc. fluid; Body Fluid  Result Value Ref Range Status   Fungus Stain Final report  Final    Comment: (NOTE) Performed At: [facility] [place], [place] [id number] Rush Farmer MD [id number]    Fungus (Mycology) Culture PENDING  Incomplete   Fungal Source LEFT  Final    Comment: UPPER ARM PERIGRAFT SAMPLE A Performed at [facility], [place] [address], [place], [place] [address]   Aerobic/Anaerobic Culture (surgical/deep wound)     Status: None   Collection Time: 04/13/19 12:05 PM   Specimen: PATH Cytology Misc. fluid; Body Fluid  Result Value Ref Range Status   Specimen Description WOUND LEFT UPPER ARM  Final   Special Requests PERIGRAFT SAMPLE A PT ON VANC  Final   Gram Stain   Final    ABUNDANT WBC PRESENT, PREDOMINANTLY PMN MODERATE GRAM POSITIVE COCCI IN CLUSTERS    Culture   Final    MODERATE STAPHYLOCOCCUS AUREUS NO ANAEROBES ISOLATED Performed at [facility], [address], [place], [place] [address]    Report Status 04/18/2019 FINAL  Final   Organism ID, Bacteria STAPHYLOCOCCUS AUREUS  Final      Susceptibility   Staphylococcus aureus - MIC*    CIPROFLOXACIN <=0.5 SENSITIVE Sensitive     ERYTHROMYCIN 0.5 SENSITIVE Sensitive     GENTAMICIN <=0.5 SENSITIVE Sensitive     OXACILLIN 0.5 SENSITIVE Sensitive     TETRACYCLINE <=1 SENSITIVE Sensitive     VANCOMYCIN <=0.5 SENSITIVE Sensitive     TRIMETH/SULFA <=10 SENSITIVE Sensitive     CLINDAMYCIN <=0.25 SENSITIVE Sensitive     RIFAMPIN <=0.5 SENSITIVE Sensitive     Inducible Clindamycin NEGATIVE Sensitive     * MODERATE STAPHYLOCOCCUS AUREUS  Acid Fast Smear (AFB)     Status: None   Collection Time: 04/13/19 12:05 PM   Specimen: PATH Cytology Misc. fluid; Body Fluid  Result Value Ref Range Status   AFB Specimen Processing Concentration  Final   Acid Fast Smear Negative  Final    Comment: (NOTE) Performed At: [facility] [place], [place]  [id number] Rush Farmer MD [id number]    Source (AFB) LEFT  Final    Comment: UPPER ARM PERIGRAFT SAMPLE A Performed at [facility], [place] [address] [place], [place] [address]   Fungus Culture Result     Status: None   Collection Time: 04/13/19 12:05 PM  Result Value Ref Range Status   Result 1 Comment  Final    Comment: (NOTE) KOH/Calcofluor preparation:  no fungus observed. Performed At: [facility] [place], [place] [id number] Rush Farmer MD [id number]      Time coordinating discharge: Over 30 minutes  SIGNED:   Ezekiel Slocumb, DO [facility] 04/21/2019, 10:18 PM Pager [phone]  If 7PM-7AM, please contact night-coverage [email or web address] Password [email or web address]

## 2019-04-21 NOTE — Telephone Encounter (Signed)
Transition of Care contact from [place]  Date of discharge: 04/20/2019 Date of contact: 04/21/2019 Method of contact: phone Talked to patient  Patient contacted to discuss transition of care from recent inpatient hospitalization. Patient was admitted to [facility] from 10/13-10/21/2020 with the discharge diagnosis of AVG infection.  Medication changes were reviewed.  Patient will follow up with his outpatient dialysis center on 04/22/2019. Other follow up needs include: none.  Jen Mow, PA-C [facility] Pager: [phone]

## 2019-05-09 DIAGNOSIS — T829XXA Unspecified complication of cardiac and vascular prosthetic device, implant and graft, initial encounter: Secondary | ICD-10-CM | POA: Insufficient documentation

## 2019-05-10 ENCOUNTER — Encounter: Payer: Medicare Other | Admitting: Vascular Surgery

## 2019-05-12 ENCOUNTER — Encounter: Payer: Medicare Other | Admitting: Vascular Surgery

## 2019-05-14 LAB — FUNGUS CULTURE WITH STAIN

## 2019-05-14 LAB — FUNGUS CULTURE RESULT

## 2019-05-14 LAB — FUNGAL ORGANISM REFLEX

## 2019-05-28 LAB — ACID FAST CULTURE WITH REFLEXED SENSITIVITIES (MYCOBACTERIA): Acid Fast Culture: NEGATIVE

## 2019-06-02 ENCOUNTER — Encounter: Payer: Medicare Other | Admitting: Vascular Surgery

## 2019-06-02 ENCOUNTER — Telehealth: Payer: Self-pay | Admitting: *Deleted

## 2019-06-02 NOTE — Telephone Encounter (Signed)
Patient missed 06/02/2019 appt with Dr. Oneida Alar. Needs to have post op stitches removed and noappt for Dr. Oneida Alar available until end of month. Rescheduled to see PA 06/03/2019.

## 2019-06-03 ENCOUNTER — Ambulatory Visit (INDEPENDENT_AMBULATORY_CARE_PROVIDER_SITE_OTHER): Payer: Medicare Other | Admitting: Physician Assistant

## 2019-06-03 ENCOUNTER — Encounter: Payer: Self-pay | Admitting: *Deleted

## 2019-06-03 ENCOUNTER — Other Ambulatory Visit: Payer: Self-pay

## 2019-06-03 VITALS — BP 167/95 | HR 78 | Temp 97.4°F | Resp 16 | Ht 69.0 in | Wt 116.0 lb

## 2019-06-03 DIAGNOSIS — N186 End stage renal disease: Secondary | ICD-10-CM

## 2019-06-03 DIAGNOSIS — Z992 Dependence on renal dialysis: Secondary | ICD-10-CM

## 2019-06-03 NOTE — Progress Notes (Signed)
Established Dialysis Access   History of Present Illness   Jocelyn Hill is a 68 y.o. (10/28/1950) female who presents for re-evaluation for permanent access.  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  She has since healed this surgery however still has some pain and occasional numbness in left upper arm and hand.  She is dialyzing via right femoral TDC.  She has a left-sided pacemaker.  She is on Coumadin for atrial fibrillation.  The patient's PMH, PSH, SH, and FamHx were reviewed and are unchanged from prior visit.  Current Outpatient Medications  Medication Sig Dispense Refill  . acetaminophen (TYLENOL) 500 MG tablet Take 1,500-2,000 mg by mouth 2 (two) times daily as needed for moderate pain or headache.     . albuterol (PROVENTIL HFA;VENTOLIN HFA) 108 (90 Base) MCG/ACT inhaler Inhale 2 puffs into the lungs every 6 (six) hours as needed for wheezing or shortness of breath.    . ALPRAZolam (XANAX) 0.25 MG tablet Take 1 tablet (0.25 mg total) by mouth at bedtime as needed for anxiety. 30 tablet 0  . amLODipine (NORVASC) 10 MG tablet Take 10 mg by mouth at bedtime.    Marland Kitchen aspirin EC 81 MG EC tablet Take 1 tablet (81 mg total) by mouth daily. (Patient taking differently: Take 81 mg by mouth at bedtime. ) 30 tablet 0  . atorvastatin (LIPITOR) 80 MG tablet Take 1 tablet (80 mg total) by mouth daily. (Patient taking differently: Take 80 mg by mouth at bedtime. ) 30 tablet 3  . b complex vitamins tablet Take 1 tablet by mouth at bedtime.    Marland Kitchen buPROPion (WELLBUTRIN) 100 MG tablet Take 1 tablet (100 mg total) by mouth 3 (three) times daily. 90 tablet 0  . busPIRone (BUSPAR) 15 MG tablet Take 1 tablet (15 mg total) by mouth 2 (two) times daily. 60 tablet 0  . calcitRIOL (ROCALTROL) 0.5 MCG capsule Take 1 capsule (0.5 mcg total) by mouth every Monday, Wednesday, and Friday with  hemodialysis.    Marland Kitchen carvedilol (COREG) 6.25 MG tablet Take 1 tablet (6.25 mg total) by mouth 2 (two) times daily. 60 tablet 0  . ceFAZolin (ANCEF) 2-4 GM/100ML-% IVPB Inject 100 mLs (2 g total) into the vein every Monday, Wednesday, and Friday with hemodialysis. 1 each   . cholecalciferol (VITAMIN D) 1000 units tablet Take 1,000 Units by mouth at bedtime.     Marland Kitchen dextromethorphan-guaiFENesin (MUCINEX DM) 30-600 MG 12hr tablet Take 1 tablet by mouth 2 (two) times daily as needed for cough. 60 tablet 0  . diphenhydrAMINE (BENADRYL) 25 MG tablet Take 1 tablet (25 mg total) by mouth daily as needed for itching. 30 tablet 0  . doxazosin (CARDURA) 4 MG tablet Take 4-8 mg by mouth See admin instructions. Take one tablet (4 mg) by mouth every morning and two tablets (8 mg) at night    . DULoxetine (CYMBALTA) 30 MG capsule Take 30 mg by mouth at bedtime.     . gabapentin (NEURONTIN) 300 MG capsule Take 1 capsule (300 mg total) by mouth 3 (three) times a week. After dialysis on M/W/F 12 capsule 1  . heparin 1000 UNIT/ML injection 1 mL (1,000 Units total) by Intracatheter route every Monday, Wednesday, and Friday with hemodialysis. 1 mL   . hydrALAZINE (APRESOLINE) 100 MG tablet Take 1 tablet (100 mg total) by mouth 3 (three) times daily.  90 tablet 0  . isosorbide mononitrate (IMDUR) 120 MG 24 hr tablet Take 1 tablet (120 mg total) by mouth at bedtime. 30 tablet 0  . levothyroxine (SYNTHROID) 150 MCG tablet Take 150 mcg by mouth daily before breakfast.    . montelukast (SINGULAIR) 10 MG tablet Take 1 tablet (10 mg total) by mouth daily. (Patient taking differently: Take 10 mg by mouth at bedtime. ) 30 tablet 0  . multivitamin (RENA-VIT) TABS tablet Take 1 tablet by mouth at bedtime.  0  . Naphazoline HCl (CLEAR EYES OP) Place 1 drop into both eyes daily as needed (dryness).    . nitroGLYCERIN (NITROSTAT) 0.4 MG SL tablet Place 0.4 mg under the tongue every 5 (five) minutes as needed for chest pain.    .  Nutritional Supplements (FEEDING SUPPLEMENT, NEPRO CARB STEADY,) LIQD Take 237 mLs by mouth 3 (three) times daily between meals. (Patient taking differently: Take 237 mLs by mouth daily. )  0  . ondansetron (ZOFRAN) 4 MG tablet Take 4 mg by mouth every 6 (six) hours as needed for nausea or vomiting.    . pantoprazole (PROTONIX) 40 MG tablet Take 1 tablet (40 mg total) by mouth 2 (two) times daily before a meal. (Patient taking differently: Take 40 mg by mouth daily before breakfast. ) 60 tablet 0  . polyethylene glycol (MIRALAX / GLYCOLAX) packet Take 17 g by mouth daily. (Patient taking differently: Take 17 g by mouth daily as needed for mild constipation. ) 14 each 0  . tiZANidine (ZANAFLEX) 4 MG tablet Take 4 mg by mouth every 8 (eight) hours as needed for muscle spasms.    . traZODone (DESYREL) 100 MG tablet Take 100 mg by mouth at bedtime.    Marland Kitchen warfarin (COUMADIN) 3 MG tablet Take 1 tablet (3 mg total) by mouth daily at 6 PM. 30 tablet 0   No current facility-administered medications for this visit.     On ROS today: 10 system ROS is negative unless otherwise noted in HPI   Physical Examination   Vitals:   06/03/19 1533  BP: (!) 167/95  Pulse: 78  Resp: 16  Temp: (!) 97.4 F (36.3 C)  TempSrc: Oral  SpO2: 91%  Weight: 116 lb (52.6 kg)  Height: 5\' 9"  (1.753 m)   Body mass index is 17.13 kg/m.  General Alert, O x 3, WD, NAD  Pulmonary Sym exp, good B air movt, CTA B  Cardiac irregular  Vascular Vessel Right Left  Radial Palpable Palpable  Brachial Palpable Palpable  Ulnar Not palpable Not palpable    Musculo- skeletal  incisions of left upper arm well-healed; palpable left radial pulse  Neurologic A&O; CN grossly intact       Medical Decision Making   Jocelyn Hill is a 68 y.o. female who presents with ESRD requiring hemodialysis.    Incisions of left arm well-healed status post infected AV graft removal  Remaining sutures removed in office today  The  ultimate goal is to have a new permanent access and remove right femoral TDC  Per Dr. Oneida Alar we will need to perform central venogram via right upper extremity  Based on venogram we will plan for new dialysis access  Patient is agreeable to proceed with the above procedure   Dagoberto Ligas PA-C [facility] Office: [facility] MD: Donzetta Matters

## 2019-06-03 NOTE — Progress Notes (Signed)
To continue Coumadin to procedure per [facility] PA. Patient instructed.

## 2019-06-06 ENCOUNTER — Other Ambulatory Visit (HOSPITAL_COMMUNITY)
Admission: RE | Admit: 2019-06-06 | Discharge: 2019-06-06 | Disposition: A | Discharge status: Home / Self Care | Payer: Medicare Other | Source: Ambulatory Visit | Attending: Vascular Surgery | Admitting: Vascular Surgery

## 2019-06-06 DIAGNOSIS — Z20828 Contact with and (suspected) exposure to other viral communicable diseases: Secondary | ICD-10-CM | POA: Insufficient documentation

## 2019-06-06 DIAGNOSIS — Z01812 Encounter for preprocedural laboratory examination: Secondary | ICD-10-CM | POA: Diagnosis present

## 2019-06-07 LAB — NOVEL CORONAVIRUS, NAA (HOSP ORDER, SEND-OUT TO REF LAB; TAT 18-24 HRS): SARS-CoV-2, NAA: NOT DETECTED

## 2019-06-09 ENCOUNTER — Encounter (HOSPITAL_COMMUNITY)
Admission: RE | Disposition: A | Discharge status: Home / Self Care | Payer: Self-pay | Source: Home / Self Care | Attending: Vascular Surgery

## 2019-06-09 ENCOUNTER — Ambulatory Visit (HOSPITAL_COMMUNITY)
Admission: RE | Admit: 2019-06-09 | Discharge: 2019-06-09 | Disposition: A | Discharge status: Home / Self Care | Payer: Medicare Other | Attending: Vascular Surgery | Admitting: Vascular Surgery

## 2019-06-09 DIAGNOSIS — Z950 Presence of cardiac pacemaker: Secondary | ICD-10-CM | POA: Diagnosis not present

## 2019-06-09 DIAGNOSIS — I4891 Unspecified atrial fibrillation: Secondary | ICD-10-CM | POA: Insufficient documentation

## 2019-06-09 DIAGNOSIS — Z79890 Hormone replacement therapy (postmenopausal): Secondary | ICD-10-CM | POA: Insufficient documentation

## 2019-06-09 DIAGNOSIS — Z7901 Long term (current) use of anticoagulants: Secondary | ICD-10-CM | POA: Insufficient documentation

## 2019-06-09 DIAGNOSIS — N186 End stage renal disease: Secondary | ICD-10-CM | POA: Insufficient documentation

## 2019-06-09 DIAGNOSIS — Z79899 Other long term (current) drug therapy: Secondary | ICD-10-CM | POA: Insufficient documentation

## 2019-06-09 DIAGNOSIS — Z7982 Long term (current) use of aspirin: Secondary | ICD-10-CM | POA: Insufficient documentation

## 2019-06-09 DIAGNOSIS — N185 Chronic kidney disease, stage 5: Secondary | ICD-10-CM

## 2019-06-09 HISTORY — PX: UPPER EXTREMITY VENOGRAPHY: CATH118272

## 2019-06-09 LAB — PROTIME-INR
INR: 1.8 — ABNORMAL HIGH (ref 0.8–1.2)
Prothrombin Time: 20.3 seconds — ABNORMAL HIGH (ref 11.4–15.2)

## 2019-06-09 SURGERY — UPPER EXTREMITY VENOGRAPHY
Anesthesia: LOCAL | Laterality: Right

## 2019-06-09 MED ORDER — SODIUM CHLORIDE 0.9% FLUSH: 3.0000 mL | INTRAVENOUS | Status: DC | PRN

## 2019-06-09 MED ORDER — HEPARIN (PORCINE) IN NACL 1000-0.9 UT/500ML-% IV SOLN
INTRAVENOUS | Status: DC | PRN
  Administered 2019-06-09: 500 mL

## 2019-06-09 MED ORDER — IODIXANOL 320 MG/ML IV SOLN
INTRAVENOUS | Status: DC | PRN
  Administered 2019-06-09: 25 mL

## 2019-06-09 NOTE — H&P (Signed)
History and Physical Interval Note:  06/09/2019 10:39 AM  Jocelyn Hill  has presented today for surgery, with the diagnosis of end stage renal.  The various methods of treatment have been discussed with the patient and family. After consideration of risks, benefits and other options for treatment, the patient has consented to  Procedure(s): UPPER EXTREMITY VENOGRAPHY (Bilateral) as a surgical intervention.  The patient's history has been reviewed, patient examined, no change in status, stable for surgery.  I have reviewed the patient's chart and labs.  Questions were answered to the patient's satisfaction.    Right upper extremity venogram.  Marty Heck  Established Dialysis Access  History of Present Illness  Jocelyn Hill is a 68 y.o. (1950-10-14) female who presents for re-evaluation for permanent access. 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 She has since healed this surgery however still has some pain and occasional numbness in left upper arm and hand. She is dialyzing via right femoral TDC. She has a left-sided pacemaker. She is on Coumadin for atrial fibrillation.  The patient's PMH, PSH, SH, and FamHx were reviewed and are unchanged from prior visit.        Current Outpatient Medications  Medication Sig Dispense Refill  . acetaminophen (TYLENOL) 500 MG tablet Take 1,500-2,000 mg by mouth 2 (two) times daily as needed for moderate pain or headache.     . albuterol (PROVENTIL HFA;VENTOLIN HFA) 108 (90 Base) MCG/ACT inhaler Inhale 2 puffs into the lungs every 6 (six) hours as needed for wheezing or shortness of breath.    . ALPRAZolam (XANAX) 0.25 MG tablet Take 1 tablet (0.25 mg total) by mouth at bedtime as needed for anxiety. 30 tablet 0  . amLODipine (NORVASC) 10 MG tablet Take 10 mg by mouth at bedtime.    Marland Kitchen aspirin EC 81 MG EC tablet Take 1 tablet (81 mg  total) by mouth daily. (Patient taking differently: Take 81 mg by mouth at bedtime. ) 30 tablet 0  . atorvastatin (LIPITOR) 80 MG tablet Take 1 tablet (80 mg total) by mouth daily. (Patient taking differently: Take 80 mg by mouth at bedtime. ) 30 tablet 3  . b complex vitamins tablet Take 1 tablet by mouth at bedtime.    Marland Kitchen buPROPion (WELLBUTRIN) 100 MG tablet Take 1 tablet (100 mg total) by mouth 3 (three) times daily. 90 tablet 0  . busPIRone (BUSPAR) 15 MG tablet Take 1 tablet (15 mg total) by mouth 2 (two) times daily. 60 tablet 0  . calcitRIOL (ROCALTROL) 0.5 MCG capsule Take 1 capsule (0.5 mcg total) by mouth every Monday, Wednesday, and Friday with hemodialysis.    Marland Kitchen carvedilol (COREG) 6.25 MG tablet Take 1 tablet (6.25 mg total) by mouth 2 (two) times daily. 60 tablet 0  . ceFAZolin (ANCEF) 2-4 GM/100ML-% IVPB Inject 100 mLs (2 g total) into the vein every Monday, Wednesday, and Friday with hemodialysis. 1 each   . cholecalciferol (VITAMIN D) 1000 units tablet Take 1,000 Units by mouth at bedtime.     Marland Kitchen dextromethorphan-guaiFENesin (MUCINEX DM) 30-600 MG 12hr tablet Take 1 tablet by mouth 2 (two) times daily as needed for cough. 60 tablet 0  . diphenhydrAMINE (BENADRYL) 25 MG tablet Take 1 tablet (25 mg total) by mouth daily as needed for itching. 30 tablet 0  . doxazosin (CARDURA) 4 MG tablet Take 4-8 mg by mouth See  admin instructions. Take one tablet (4 mg) by mouth every morning and two tablets (8 mg) at night    . DULoxetine (CYMBALTA) 30 MG capsule Take 30 mg by mouth at bedtime.     . gabapentin (NEURONTIN) 300 MG capsule Take 1 capsule (300 mg total) by mouth 3 (three) times a week. After dialysis on M/W/F 12 capsule 1  . heparin 1000 UNIT/ML injection 1 mL (1,000 Units total) by Intracatheter route every Monday, Wednesday, and Friday with hemodialysis. 1 mL   . hydrALAZINE (APRESOLINE) 100 MG tablet Take 1 tablet (100 mg total) by mouth 3 (three) times daily. 90 tablet 0  . isosorbide  mononitrate (IMDUR) 120 MG 24 hr tablet Take 1 tablet (120 mg total) by mouth at bedtime. 30 tablet 0  . levothyroxine (SYNTHROID) 150 MCG tablet Take 150 mcg by mouth daily before breakfast.    . montelukast (SINGULAIR) 10 MG tablet Take 1 tablet (10 mg total) by mouth daily. (Patient taking differently: Take 10 mg by mouth at bedtime. ) 30 tablet 0  . multivitamin (RENA-VIT) TABS tablet Take 1 tablet by mouth at bedtime.  0  . Naphazoline HCl (CLEAR EYES OP) Place 1 drop into both eyes daily as needed (dryness).    . nitroGLYCERIN (NITROSTAT) 0.4 MG SL tablet Place 0.4 mg under the tongue every 5 (five) minutes as needed for chest pain.    . Nutritional Supplements (FEEDING SUPPLEMENT, NEPRO CARB STEADY,) LIQD Take 237 mLs by mouth 3 (three) times daily between meals. (Patient taking differently: Take 237 mLs by mouth daily. )  0  . ondansetron (ZOFRAN) 4 MG tablet Take 4 mg by mouth every 6 (six) hours as needed for nausea or vomiting.    . pantoprazole (PROTONIX) 40 MG tablet Take 1 tablet (40 mg total) by mouth 2 (two) times daily before a meal. (Patient taking differently: Take 40 mg by mouth daily before breakfast. ) 60 tablet 0  . polyethylene glycol (MIRALAX / GLYCOLAX) packet Take 17 g by mouth daily. (Patient taking differently: Take 17 g by mouth daily as needed for mild constipation. ) 14 each 0  . tiZANidine (ZANAFLEX) 4 MG tablet Take 4 mg by mouth every 8 (eight) hours as needed for muscle spasms.    . traZODone (DESYREL) 100 MG tablet Take 100 mg by mouth at bedtime.    Marland Kitchen warfarin (COUMADIN) 3 MG tablet Take 1 tablet (3 mg total) by mouth daily at 6 PM. 30 tablet 0   No current facility-administered medications for this visit.    On ROS today: 10 system ROS is negative unless otherwise noted in HPI  Physical Examination      Vitals:   06/03/19 1533  BP: (!) 167/95  Pulse: 78  Resp: 16  Temp: (!) 97.4 F (36.3 C)  TempSrc: Oral  SpO2: 91%  Weight: 116 lb (52.6 kg)   Height: 5\' 9"  (1.753 m)   Body mass index is 17.13 kg/m.  General Alert, O x 3, WD, NAD  Pulmonary Sym exp, good B air movt, CTA B  Cardiac irregular  Vascular Vessel Right Left    Radial Palpable Palpable    Brachial Palpable Palpable    Ulnar Not palpable Not palpable   Musculo-  skeletal incisions of left upper arm well-healed; palpable left radial pulse  Neurologic A&O; CN grossly intact   Medical Decision Making  Jocelyn Hill is a 68 y.o. female who presents with ESRD requiring hemodialysis.  Incisions of left  arm well-healed status post infected AV graft removal  Remaining sutures removed in office today  The ultimate goal is to have a new permanent access and remove right femoral TDC  Per Dr. Oneida Alar we will need to perform central venogram via right upper extremity  Based on venogram we will plan for new dialysis access  Patient is agreeable to proceed with the above procedure Dagoberto Ligas PA-C  [facility]  Office: [facility] MD: Donzetta Matters

## 2019-06-09 NOTE — Op Note (Signed)
Date: June 09, 2019  Preoperative diagnosis: End-stage renal disease with need for permanent hemodialysis access  Postoperative diagnosis: Same  Procedure: Right upper extremity venogram  Surgeon: Dr. Monica Martinez, MD  Assistant: Cath Lab staff  Indication: Patient is a 68 year old female who previously underwent removal of an infected left upper arm AV graft earlier this year.  She currently has a femoral tunneled dialysis catheter for hemodialysis needs.  She presents today for right upper extremity venogram to evaluate for future AV fistula access evaluation.  Findings: Patient appeared to have a possible basilic vein that could be usable for autogenous AV fistula in the right arm above the elbow and if not certainly option of AV graft with no central stenosis and patent axillary vein.  Anesthesia: None  Details: Patient was taken to [facility] lab 8 after informed consent was obtained.  She had a IV placed in her right wrist by the IV team in holding prior to the procedure.  She was placed on the fluoroscopic table in supine position.  Ultimately sequential imaging of the right upper extremity starting with central venous structures including upper arm and forearm by injecting contrast into IV.  There was no evidence of central venous occlusion.  She had a very robust axillary as well as deep brachial veins.  There did appear to be an option for basilic vein in her right arm as well.  She tolerated the procedure without any complication she will be taken to holding stable and can be discharged.  Plan: Patient will be scheduled for right upper extremity basilic vein fistula versus AV graft.  Complication: None  Condition: Stable  Marty Heck, MD [facility] Office: [phone] Pager: [place]

## 2019-06-09 NOTE — Discharge Instructions (Signed)
Venogram, Care After °This sheet gives you information about how to care for yourself after your procedure. Your health care provider may also give you more specific instructions. If you have problems or questions, contact your health care provider. °What can I expect after the procedure? °After the procedure, it is common to have: °· Bruising or mild discomfort in the area where the IV was inserted (insertion site). °Follow these instructions at home: °Eating and drinking ° °· Follow instructions from your health care provider about eating or drinking restrictions. °· Drink a lot of fluids for the first several days after the procedure, as directed by your health care provider. This helps to wash (flush) the contrast out of your body. Examples of healthy fluids include water or low-calorie drinks. °General instructions °· Check your IV insertion area every day for signs of infection. Check for: °? Redness, swelling, or pain. °? Fluid or blood. °? Warmth. °? Pus or a bad smell. °· Take over-the-counter and prescription medicines only as told by your health care provider. °· Rest and return to your normal activities as told by your health care provider. Ask your health care provider what activities are safe for you. °· Do not drive for 24 hours if you were given a medicine to help you relax (sedative), or until your health care provider approves. °· Keep all follow-up visits as told by your health care provider. This is important. °Contact a health care provider if: °· Your skin becomes itchy or you develop a rash or hives. °· You have a fever that does not get better with medicine. °· You feel nauseous. °· You vomit. °· You have redness, swelling, or pain around the insertion site. °· You have fluid or blood coming from the insertion site. °· Your insertion area feels warm to the touch. °· You have pus or a bad smell coming from the insertion site. °Get help right away if: °· You have difficulty breathing or  shortness of breath. °· You develop chest pain. °· You faint. °· You feel very dizzy. °These symptoms may represent a serious problem that is an emergency. Do not wait to see if the symptoms will go away. Get medical help right away. Call your local emergency services (911 in the [place] Do not drive yourself to the hospital. °Summary °· After your procedure, it is common to have bruising or mild discomfort in the area where the IV was inserted. °· You should check your IV insertion area every day for signs of infection. °· Take over-the-counter and prescription medicines only as told by your health care provider. °· You should drink a lot of fluids for the first several days after the procedure to help flush the contrast from your body. °This information is not intended to replace advice given to you by your health care provider. Make sure you discuss any questions you have with your health care provider. °Document Released: 04/06/2013 Document Revised: 05/29/2017 Document Reviewed: 05/10/2016 °Elsevier Patient Education © 2020 [employer] ° °

## 2019-06-13 LAB — POCT I-STAT, CHEM 8
BUN: 33 mg/dL — ABNORMAL HIGH (ref 8–23)
Calcium, Ion: 1.21 mmol/L (ref 1.15–1.40)
Chloride: 105 mmol/L (ref 98–111)
Creatinine, Ser: 4.4 mg/dL — ABNORMAL HIGH (ref 0.44–1.00)
Glucose, Bld: 133 mg/dL — ABNORMAL HIGH (ref 70–99)
HCT: 40 % (ref 36.0–46.0)
Hemoglobin: 13.6 g/dL (ref 12.0–15.0)
Potassium: 4 mmol/L (ref 3.5–5.1)
Sodium: 140 mmol/L (ref 135–145)
TCO2: 24 mmol/L (ref 22–32)

## 2019-06-22 ENCOUNTER — Other Ambulatory Visit: Payer: Self-pay

## 2019-06-28 ENCOUNTER — Telehealth: Payer: Self-pay | Admitting: *Deleted

## 2019-06-28 NOTE — Telephone Encounter (Signed)
Left message on voice mail and with [facility] for patient to call this office to re-schedule surgery.

## 2019-06-29 ENCOUNTER — Encounter: Payer: Self-pay | Admitting: *Deleted

## 2019-06-29 ENCOUNTER — Other Ambulatory Visit: Payer: Self-pay | Admitting: *Deleted

## 2019-06-29 NOTE — Progress Notes (Signed)
Patient called and notified of day change for surgery with Dr. Carlis Abbott. Reminded to hold Copumadin for 3 days pre-op. Verbalized understanding.   Pre-op letter(See letter) faxed to [facility]

## 2019-07-06 ENCOUNTER — Other Ambulatory Visit (HOSPITAL_COMMUNITY): Payer: Medicare Other

## 2019-07-12 ENCOUNTER — Other Ambulatory Visit (HOSPITAL_COMMUNITY)
Admission: RE | Admit: 2019-07-12 | Discharge: 2019-07-12 | Disposition: A | Discharge status: Home / Self Care | Payer: Medicare Other | Source: Ambulatory Visit | Attending: Vascular Surgery | Admitting: Vascular Surgery

## 2019-07-12 DIAGNOSIS — Z01812 Encounter for preprocedural laboratory examination: Secondary | ICD-10-CM | POA: Diagnosis present

## 2019-07-12 DIAGNOSIS — Z20822 Contact with and (suspected) exposure to covid-19: Secondary | ICD-10-CM | POA: Diagnosis not present

## 2019-07-12 LAB — SARS CORONAVIRUS 2 (TAT 6-24 HRS): SARS Coronavirus 2: NEGATIVE

## 2019-07-14 ENCOUNTER — Encounter (HOSPITAL_COMMUNITY): Payer: Self-pay | Admitting: Vascular Surgery

## 2019-07-14 ENCOUNTER — Other Ambulatory Visit: Payer: Self-pay

## 2019-07-14 NOTE — Progress Notes (Addendum)
Jocelyn Hill did not answer her phone fpr me or the pharmacy.  I called Jocelyn Hill and spoke to a nurse who informed me that patient dialyses on MWF.  I asked for the kidney center to fax me patient's medication record. I also asked for last A1C value- it was drawn 04/27/2019 - 5.5. I called Jocelyn Hill office to speak  with Jocelyn Hill states that Jocelyn Hill has to have surgery on Friday for transportation.  Jocelyn Hill has sent the information to the kidney center, Jocelyn Hill was to discuss a change in dialysis day.  The nurse I spoke with today did not have information about a change in day.  I spoke with Jocelyn Hill, she reports that she will have dialysis on Sat. Jocelyn Hill denies chest pain or shortness of breath at this time. Patient tested negative for Covid 07/12/2019 and has been in quarantine with her family.  Jocelyn Hill states that she has chest pain, last time was on dialysis yesterday. Patient describes chest pain as stabbing and heavy, states the pain last 5- 10 minutes.  Jocelyn Hill states that she does sometimes has shortness of breath with pain and ocassionaly she  becomes diaphosis. Jocelyn Hill states that some times she will drink some soda and "burp" and pain goes away.  I asked patient if she ever takes NTG, she reports "no, it doesn't last long enough." I ask d patient if her Jocelyn knows , she said yes, I asked if her cardiologists knows, she said no, she hasn't seen him in a long time. PCP is Jocelyn Hill in [place], patient saw recently and reports that he changed Thyroid dose and discontinued medications for depression. I  Will fax a request to PCP office and request the dose for thyroid and last office note.  Jocelyn Hill has type II diabetes- diet controlled. Jocelyn Hill does not have a functioning CBG machine. I asked PA- C to review.  [facility] to see if they received Peri OP Device Order request, I was told that they have the order sheet and will send it before they  leave the office today.  Jocelyn Hill has an appointment with cardiologist in February 2021.

## 2019-07-14 NOTE — Anesthesia Preprocedure Evaluation (Addendum)
Anesthesia Evaluation  Patient identified by MRN, date of birth, ID band Patient awake    Reviewed: Allergy & Precautions, H&P , NPO status , Patient's Chart, lab work & pertinent test results  Airway Mallampati: II   Neck ROM: full    Dental   Pulmonary shortness of breath, former smoker,    breath sounds clear to auscultation       Cardiovascular hypertension, + CAD, + Past MI, + CABG and +CHF  + dysrhythmias Atrial Fibrillation + Valvular Problems/Murmurs  Rhythm:regular Rate:Normal  S/p MAZE for AF. S/p MVR CABG (2019)   Neuro/Psych  Headaches, PSYCHIATRIC DISORDERS Anxiety Depression CVA    GI/Hepatic GERD  ,  Endo/Other  diabetes, Type 2Hypothyroidism   Renal/GU ESRFRenal disease     Musculoskeletal  (+) Arthritis , Fibromyalgia -  Abdominal   Peds  Hematology   Anesthesia Other Findings   Reproductive/Obstetrics                            Anesthesia Physical Anesthesia Plan  ASA: IV  Anesthesia Plan: MAC   Post-op Pain Management:    Induction: Intravenous  PONV Risk Score and Plan: 2 and Ondansetron, Propofol infusion and Treatment may vary due to age or medical condition  Airway Management Planned: Simple Face Mask  Additional Equipment:   Intra-op Plan:   Post-operative Plan:   Informed Consent: I have reviewed the patients History and Physical, chart, labs and discussed the procedure including the risks, benefits and alternatives for the proposed anesthesia with the patient or authorized representative who has indicated his/her understanding and acceptance.       Plan Discussed with: CRNA, Anesthesiologist and Surgeon  Anesthesia Plan Comments: (ESRD on HD MWF. S/p ligation of her left upper arm AV graft for steal syndrome 03/01/19 and subsequently required removal of infected graft on 04/13/2019. She is dialyzing via right femoral TDC.  Follows with cardiology for  hx of CAD, PAF s/p Bioprosthetic MVR, Maze, LA appendage clip, PFO closure, and CABG with LIMA-LAD and SVG-LCx 03/4075, complicated by AKI and CHB requiring [employer] BiV PPM implantation. She was last seen by cardiology 04/17/18. It appears he last remote transmission was 03/09/18, shortly after it was placed. Device was interrogated postoperatively after recent surgeries 03/01/19 and 04/14/19.   Will need DOS labs and eval.   EKG 04/17/19: Atrial-sensed ventricular-paced rhythm. Rate 89. Biventricular pacemaker detected.  TTE 04/18/18: - Left ventricle: The cavity size was normal. There was severe   concentric hypertrophy. Systolic function was moderately to   severely reduced. The estimated ejection fraction was in the   range of 30% to 35%. Features are consistent with a pseudonormal   left ventricular filling pattern, with concomitant abnormal   relaxation and increased filling pressure (grade 2 diastolic   dysfunction). Doppler parameters are consistent with elevated   ventricular end-diastolic filling pressure. - Mitral valve: S/P MVR with a bioprosthetic valve. Mean gradient   (D): 5 mm Hg. Valve area by continuity equation (using LVOT   flow): 1.06 cm^2. - Right ventricle: Systolic function was normal. - Right atrium: The atrium was normal in size. - Pulmonic valve: There was mild regurgitation. - Pulmonary arteries: Systolic pressure was within the normal   range. - Pericardium, extracardiac: A trivial pericardial effusion was   identified posterior to the heart. Features were not consistent   with tamponade physiology. There was a large left pleural   effusion.  Impressions:  -  LVEF is 30-35% with diffuse hypokinesis and significant   paradoxical septal motion and interventricular dyssynchrony.   LVEDP is severely elevated with E/E&' =32. LVEF is unchanged from   the prior echocardiogram from 02/22/2018.)       Anesthesia Quick Evaluation

## 2019-07-14 NOTE — Progress Notes (Signed)
Anesthesia Chart Review: Same day workup  ESRD on HD MWF. S/p ligation of her left upper arm AV graft for steal syndrome 03/01/19 and subsequently required removal of infected graft on 04/13/2019. She is dialyzing via right femoral TDC.  Follows with cardiology for hx of CAD, PAF s/p Bioprosthetic MVR, Maze, LA appendage clip, PFO closure, and CABG with LIMA-LAD and SVG-LCx [id number], complicated by AKI and CHB requiring [employer] Jude BiV PPM implantation. She was last seen by cardiology 04/17/18. It appears he last remote transmission was 03/09/18, shortly after it was placed. Device was interrogated postoperatively after recent surgeries 03/01/19 and 04/14/19.   Will need DOS labs and eval.   EKG 04/17/19: Atrial-sensed ventricular-paced rhythm. Rate 89. Biventricular pacemaker detected.  TTE 04/18/18: - Left ventricle: The cavity size was normal. There was severe   concentric hypertrophy. Systolic function was moderately to   severely reduced. The estimated ejection fraction was in the   range of 30% to 35%. Features are consistent with a pseudonormal   left ventricular filling pattern, with concomitant abnormal   relaxation and increased filling pressure (grade 2 diastolic   dysfunction). Doppler parameters are consistent with elevated   ventricular end-diastolic filling pressure. - Mitral valve: S/P MVR with a bioprosthetic valve. Mean gradient   (D): 5 mm Hg. Valve area by continuity equation (using LVOT   flow): 1.06 cm^2. - Right ventricle: Systolic function was normal. - Right atrium: The atrium was normal in size. - Pulmonic valve: There was mild regurgitation. - Pulmonary arteries: Systolic pressure was within the normal   range. - Pericardium, extracardiac: A trivial pericardial effusion was   identified posterior to the heart. Features were not consistent   with tamponade physiology. There was a large left pleural   effusion.  Impressions:  - LVEF is 30-35% with diffuse  hypokinesis and significant   paradoxical septal motion and interventricular dyssynchrony.   LVEDP is severely elevated with E/E&' =32. LVEF is unchanged from   the prior echocardiogram from 02/22/2018.   Wynonia Musty [facility]/Anesthesiology Phone [phone] 07/14/2019 3:27 PM

## 2019-07-15 ENCOUNTER — Ambulatory Visit (HOSPITAL_COMMUNITY)
Admission: RE | Admit: 2019-07-15 | Discharge: 2019-07-15 | Disposition: A | Discharge status: Home / Self Care | Payer: Medicare Other | Attending: Vascular Surgery | Admitting: Vascular Surgery

## 2019-07-15 ENCOUNTER — Encounter (HOSPITAL_COMMUNITY)
Admission: RE | Disposition: A | Discharge status: Home / Self Care | Payer: Self-pay | Source: Home / Self Care | Attending: Vascular Surgery

## 2019-07-15 ENCOUNTER — Encounter (HOSPITAL_COMMUNITY): Payer: Self-pay | Admitting: Vascular Surgery

## 2019-07-15 ENCOUNTER — Other Ambulatory Visit: Payer: Self-pay

## 2019-07-15 ENCOUNTER — Ambulatory Visit (HOSPITAL_COMMUNITY): Payer: Medicare Other | Admitting: Physician Assistant

## 2019-07-15 DIAGNOSIS — Z79899 Other long term (current) drug therapy: Secondary | ICD-10-CM | POA: Diagnosis not present

## 2019-07-15 DIAGNOSIS — Z952 Presence of prosthetic heart valve: Secondary | ICD-10-CM | POA: Diagnosis not present

## 2019-07-15 DIAGNOSIS — I120 Hypertensive chronic kidney disease with stage 5 chronic kidney disease or end stage renal disease: Secondary | ICD-10-CM | POA: Insufficient documentation

## 2019-07-15 DIAGNOSIS — Z7982 Long term (current) use of aspirin: Secondary | ICD-10-CM | POA: Diagnosis not present

## 2019-07-15 DIAGNOSIS — I4891 Unspecified atrial fibrillation: Secondary | ICD-10-CM | POA: Insufficient documentation

## 2019-07-15 DIAGNOSIS — Z992 Dependence on renal dialysis: Secondary | ICD-10-CM | POA: Diagnosis present

## 2019-07-15 DIAGNOSIS — I2510 Atherosclerotic heart disease of native coronary artery without angina pectoris: Secondary | ICD-10-CM | POA: Insufficient documentation

## 2019-07-15 DIAGNOSIS — Z7901 Long term (current) use of anticoagulants: Secondary | ICD-10-CM | POA: Insufficient documentation

## 2019-07-15 DIAGNOSIS — Z950 Presence of cardiac pacemaker: Secondary | ICD-10-CM | POA: Diagnosis not present

## 2019-07-15 DIAGNOSIS — N186 End stage renal disease: Secondary | ICD-10-CM | POA: Diagnosis not present

## 2019-07-15 DIAGNOSIS — E1122 Type 2 diabetes mellitus with diabetic chronic kidney disease: Secondary | ICD-10-CM | POA: Diagnosis not present

## 2019-07-15 DIAGNOSIS — Z951 Presence of aortocoronary bypass graft: Secondary | ICD-10-CM | POA: Diagnosis not present

## 2019-07-15 DIAGNOSIS — N185 Chronic kidney disease, stage 5: Secondary | ICD-10-CM

## 2019-07-15 HISTORY — DX: Cardiac arrhythmia, unspecified: I49.9

## 2019-07-15 HISTORY — PX: AV FISTULA PLACEMENT: SHX1204

## 2019-07-15 HISTORY — DX: Fibromyalgia: M79.7

## 2019-07-15 HISTORY — DX: Dyspnea, unspecified: R06.00

## 2019-07-15 LAB — GLUCOSE, CAPILLARY
Glucose-Capillary: 109 mg/dL — ABNORMAL HIGH (ref 70–99)
Glucose-Capillary: 149 mg/dL — ABNORMAL HIGH (ref 70–99)

## 2019-07-15 LAB — POCT I-STAT, CHEM 8
BUN: 45 mg/dL — ABNORMAL HIGH (ref 8–23)
Calcium, Ion: 1.15 mmol/L (ref 1.15–1.40)
Chloride: 98 mmol/L (ref 98–111)
Creatinine, Ser: 4.9 mg/dL — ABNORMAL HIGH (ref 0.44–1.00)
Glucose, Bld: 132 mg/dL — ABNORMAL HIGH (ref 70–99)
HCT: 33 % — ABNORMAL LOW (ref 36.0–46.0)
Hemoglobin: 11.2 g/dL — ABNORMAL LOW (ref 12.0–15.0)
Potassium: 3.2 mmol/L — ABNORMAL LOW (ref 3.5–5.1)
Sodium: 136 mmol/L (ref 135–145)
TCO2: 29 mmol/L (ref 22–32)

## 2019-07-15 SURGERY — ARTERIOVENOUS (AV) FISTULA CREATION
Anesthesia: Monitor Anesthesia Care | Site: Arm Lower | Laterality: Right

## 2019-07-15 MED ORDER — OXYCODONE HCL 5 MG PO TABS: 5.0000 mg | ORAL_TABLET | Freq: Four times a day (QID) | ORAL | 0 refills | Status: DC | PRN

## 2019-07-15 MED ORDER — PROPOFOL 500 MG/50ML IV EMUL
INTRAVENOUS | Status: DC | PRN
  Administered 2019-07-15: 50 ug/kg/min via INTRAVENOUS

## 2019-07-15 MED ORDER — CHLORHEXIDINE GLUCONATE 4 % EX LIQD: 60.0000 mL | Freq: Once | CUTANEOUS | Status: DC

## 2019-07-15 MED ORDER — HEPARIN SODIUM (PORCINE) 1000 UNIT/ML IJ SOLN
INTRAMUSCULAR | Status: AC
  Filled 2019-07-15: qty 1

## 2019-07-15 MED ORDER — SODIUM CHLORIDE 0.9 % IV SOLN
INTRAVENOUS | Status: DC | PRN
  Administered 2019-07-15: 500 mL

## 2019-07-15 MED ORDER — FENTANYL CITRATE (PF) 100 MCG/2ML IJ SOLN
INTRAMUSCULAR | Status: DC | PRN
  Administered 2019-07-15: 50 ug via INTRAVENOUS

## 2019-07-15 MED ORDER — ONDANSETRON HCL 4 MG/2ML IJ SOLN
INTRAMUSCULAR | Status: DC | PRN
  Administered 2019-07-15: 4 mg via INTRAVENOUS

## 2019-07-15 MED ORDER — LIDOCAINE HCL (PF) 1 % IJ SOLN
INTRAMUSCULAR | Status: AC
  Filled 2019-07-15: qty 30

## 2019-07-15 MED ORDER — PROPOFOL 10 MG/ML IV BOLUS
INTRAVENOUS | Status: DC | PRN
  Administered 2019-07-15: 20 mg via INTRAVENOUS

## 2019-07-15 MED ORDER — OXYCODONE HCL 5 MG PO TABS
5.0000 mg | ORAL_TABLET | Freq: Once | ORAL | Status: AC | PRN
  Administered 2019-07-15: 5 mg via ORAL

## 2019-07-15 MED ORDER — CEFAZOLIN SODIUM-DEXTROSE 2-4 GM/100ML-% IV SOLN
2.0000 g | INTRAVENOUS | Status: DC
  Filled 2019-07-15: qty 100

## 2019-07-15 MED ORDER — FENTANYL CITRATE (PF) 100 MCG/2ML IJ SOLN
25.0000 ug | INTRAMUSCULAR | Status: DC | PRN
  Administered 2019-07-15: 09:00:00 50 ug via INTRAVENOUS

## 2019-07-15 MED ORDER — SODIUM CHLORIDE 0.9 % IV SOLN: INTRAVENOUS | Status: DC

## 2019-07-15 MED ORDER — LIDOCAINE 2% (20 MG/ML) 5 ML SYRINGE
INTRAMUSCULAR | Status: DC | PRN
  Administered 2019-07-15: 40 mg via INTRAVENOUS

## 2019-07-15 MED ORDER — LIDOCAINE HCL 1 % IJ SOLN
INTRAMUSCULAR | Status: DC | PRN
  Administered 2019-07-15: 7 mL

## 2019-07-15 MED ORDER — SODIUM CHLORIDE 0.9 % IV SOLN
INTRAVENOUS | Status: AC
  Filled 2019-07-15: qty 1.2

## 2019-07-15 MED ORDER — PHENYLEPHRINE 40 MCG/ML (10ML) SYRINGE FOR IV PUSH (FOR BLOOD PRESSURE SUPPORT)
PREFILLED_SYRINGE | INTRAVENOUS | Status: AC
  Filled 2019-07-15: qty 10

## 2019-07-15 MED ORDER — CEFAZOLIN SODIUM-DEXTROSE 2-3 GM-%(50ML) IV SOLR
INTRAVENOUS | Status: DC | PRN
  Administered 2019-07-15: 2 g via INTRAVENOUS

## 2019-07-15 MED ORDER — FENTANYL CITRATE (PF) 250 MCG/5ML IJ SOLN
INTRAMUSCULAR | Status: AC
  Filled 2019-07-15: qty 5

## 2019-07-15 MED ORDER — FENTANYL CITRATE (PF) 100 MCG/2ML IJ SOLN
INTRAMUSCULAR | Status: AC
  Filled 2019-07-15: qty 2

## 2019-07-15 MED ORDER — ONDANSETRON HCL 4 MG/2ML IJ SOLN: 4.0000 mg | Freq: Four times a day (QID) | INTRAMUSCULAR | Status: DC | PRN

## 2019-07-15 MED ORDER — HEPARIN SODIUM (PORCINE) 1000 UNIT/ML IJ SOLN
INTRAMUSCULAR | Status: DC | PRN
  Administered 2019-07-15: 3000 [IU] via INTRAVENOUS

## 2019-07-15 MED ORDER — OXYCODONE HCL 5 MG PO TABS
ORAL_TABLET | ORAL | Status: AC
  Filled 2019-07-15: qty 1

## 2019-07-15 MED ORDER — ONDANSETRON HCL 4 MG/2ML IJ SOLN
INTRAMUSCULAR | Status: AC
  Filled 2019-07-15: qty 2

## 2019-07-15 MED ORDER — PROPOFOL 10 MG/ML IV BOLUS
INTRAVENOUS | Status: AC
  Filled 2019-07-15: qty 20

## 2019-07-15 MED ORDER — OXYCODONE HCL 5 MG/5ML PO SOLN: 5.0000 mg | Freq: Once | ORAL | Status: AC | PRN

## 2019-07-15 MED ORDER — 0.9 % SODIUM CHLORIDE (POUR BTL) OPTIME
TOPICAL | Status: DC | PRN
  Administered 2019-07-15: 1000 mL

## 2019-07-15 SURGICAL SUPPLY — 32 items
ARMBAND PINK RESTRICT EXTREMIT (MISCELLANEOUS) ×3 IMPLANT
CANISTER SUCT 3000ML PPV (MISCELLANEOUS) ×2 IMPLANT
CLIP VESOCCLUDE MED 6/CT (CLIP) ×2 IMPLANT
CLIP VESOCCLUDE SM WIDE 6/CT (CLIP) ×2 IMPLANT
COVER PROBE W GEL 5X96 (DRAPES) ×2 IMPLANT
COVER WAND RF STERILE (DRAPES) ×1 IMPLANT
DECANTER SPIKE VIAL GLASS SM (MISCELLANEOUS) ×2 IMPLANT
DERMABOND ADVANCED (GAUZE/BANDAGES/DRESSINGS) ×1
DERMABOND ADVANCED .7 DNX12 (GAUZE/BANDAGES/DRESSINGS) ×1 IMPLANT
ELECT REM PT RETURN 9FT ADLT (ELECTROSURGICAL) ×2
ELECTRODE REM PT RTRN 9FT ADLT (ELECTROSURGICAL) ×1 IMPLANT
GLOVE BIO SURGEON STRL SZ7.5 (GLOVE) ×2 IMPLANT
GLOVE BIOGEL PI IND STRL 8 (GLOVE) ×1 IMPLANT
GLOVE BIOGEL PI INDICATOR 8 (GLOVE) ×1
GOWN STRL REUS W/ TWL LRG LVL3 (GOWN DISPOSABLE) ×2 IMPLANT
GOWN STRL REUS W/ TWL XL LVL3 (GOWN DISPOSABLE) ×2 IMPLANT
GOWN STRL REUS W/TWL LRG LVL3 (GOWN DISPOSABLE) ×2
GOWN STRL REUS W/TWL XL LVL3 (GOWN DISPOSABLE) ×2
HEMOSTAT SPONGE AVITENE ULTRA (HEMOSTASIS) IMPLANT
KIT BASIN OR (CUSTOM PROCEDURE TRAY) ×2 IMPLANT
KIT TURNOVER KIT B (KITS) ×2 IMPLANT
NS IRRIG 1000ML POUR BTL (IV SOLUTION) ×2 IMPLANT
PACK CV ACCESS (CUSTOM PROCEDURE TRAY) ×2 IMPLANT
PAD ARMBOARD 7.5X6 YLW CONV (MISCELLANEOUS) ×4 IMPLANT
SUT MNCRL AB 4-0 PS2 18 (SUTURE) ×2 IMPLANT
SUT PROLENE 6 0 BV (SUTURE) ×3 IMPLANT
SUT PROLENE 7 0 BV 1 (SUTURE) IMPLANT
SUT VIC AB 3-0 SH 27 (SUTURE) ×1
SUT VIC AB 3-0 SH 27X BRD (SUTURE) ×1 IMPLANT
TOWEL GREEN STERILE (TOWEL DISPOSABLE) ×2 IMPLANT
UNDERPAD 30X30 (UNDERPADS AND DIAPERS) ×2 IMPLANT
WATER STERILE IRR 1000ML POUR (IV SOLUTION) ×2 IMPLANT

## 2019-07-15 NOTE — H&P (Signed)
History and Physical Interval Note:  07/15/2019 7:21 AM  Jocelyn Hill  has presented today for surgery, with the diagnosis of END STAGE RENAL.  The various methods of treatment have been discussed with the patient and family. After consideration of risks, benefits and other options for treatment, the patient has consented to  Procedure(s): ARTERIOVENOUS (AV) FISTULA CREATION VERSES ARTERIOVENOUS GRAFT CREATION (Right) as a surgical intervention.  The patient's history has been reviewed, patient examined, no change in status, stable for surgery.  I have reviewed the patient's chart and labs.  Questions were answered to the patient's satisfaction.    Right arm AVF vs graft  Jocelyn Hill  Established Dialysis Access  History of Present Illness  Jocelyn Hill is a 69 y.o. ([birth year]) female who presents for re-evaluation for permanent access. 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 She has since healed this surgery however still has some pain and occasional numbness in left upper arm and hand. She is dialyzing via right femoral TDC. She has a left-sided pacemaker. She is on Coumadin for atrial fibrillation.  The patient's PMH, PSH, SH, and FamHx were reviewed and are unchanged from prior visit.        Current Outpatient Medications  Medication Sig Dispense Refill  . acetaminophen (TYLENOL) 500 MG tablet Take 1,500-2,000 mg by mouth 2 (two) times daily as needed for moderate pain or headache.     . albuterol (PROVENTIL HFA;VENTOLIN HFA) 108 (90 Base) MCG/ACT inhaler Inhale 2 puffs into the lungs every 6 (six) hours as needed for wheezing or shortness of breath.    . ALPRAZolam (XANAX) 0.25 MG tablet Take 1 tablet (0.25 mg total) by mouth at bedtime as needed for anxiety. 30 tablet 0  . amLODipine (NORVASC) 10 MG tablet Take 10 mg by mouth at bedtime.    Marland Kitchen aspirin EC 81 MG EC  tablet Take 1 tablet (81 mg total) by mouth daily. (Patient taking differently: Take 81 mg by mouth at bedtime. ) 30 tablet 0  . atorvastatin (LIPITOR) 80 MG tablet Take 1 tablet (80 mg total) by mouth daily. (Patient taking differently: Take 80 mg by mouth at bedtime. ) 30 tablet 3  . b complex vitamins tablet Take 1 tablet by mouth at bedtime.    Marland Kitchen buPROPion (WELLBUTRIN) 100 MG tablet Take 1 tablet (100 mg total) by mouth 3 (three) times daily. 90 tablet 0  . busPIRone (BUSPAR) 15 MG tablet Take 1 tablet (15 mg total) by mouth 2 (two) times daily. 60 tablet 0  . calcitRIOL (ROCALTROL) 0.5 MCG capsule Take 1 capsule (0.5 mcg total) by mouth every Monday, Wednesday, and Friday with hemodialysis.    Marland Kitchen carvedilol (COREG) 6.25 MG tablet Take 1 tablet (6.25 mg total) by mouth 2 (two) times daily. 60 tablet 0  . ceFAZolin (ANCEF) 2-4 GM/100ML-% IVPB Inject 100 mLs (2 g total) into the vein every Monday, Wednesday, and Friday with hemodialysis. 1 each   . cholecalciferol (VITAMIN D) 1000 units tablet Take 1,000 Units by mouth at bedtime.     Marland Kitchen dextromethorphan-guaiFENesin (MUCINEX DM) 30-600 MG 12hr tablet Take 1 tablet by mouth 2 (two) times daily as needed for cough. 60 tablet 0  . diphenhydrAMINE (BENADRYL) 25 MG tablet Take 1 tablet (25 mg total) by mouth daily as needed for itching. 30 tablet 0  . doxazosin (CARDURA) 4 MG tablet  Take 4-8 mg by mouth See admin instructions. Take one tablet (4 mg) by mouth every morning and two tablets (8 mg) at night    . DULoxetine (CYMBALTA) 30 MG capsule Take 30 mg by mouth at bedtime.     . gabapentin (NEURONTIN) 300 MG capsule Take 1 capsule (300 mg total) by mouth 3 (three) times a week. After dialysis on M/W/F 12 capsule 1  . heparin 1000 UNIT/ML injection 1 mL (1,000 Units total) by Intracatheter route every Monday, Wednesday, and Friday with hemodialysis. 1 mL   . hydrALAZINE (APRESOLINE) 100 MG tablet Take 1 tablet (100 mg total) by mouth 3 (three) times  daily. 90 tablet 0  . isosorbide mononitrate (IMDUR) 120 MG 24 hr tablet Take 1 tablet (120 mg total) by mouth at bedtime. 30 tablet 0  . levothyroxine (SYNTHROID) 150 MCG tablet Take 150 mcg by mouth daily before breakfast.    . montelukast (SINGULAIR) 10 MG tablet Take 1 tablet (10 mg total) by mouth daily. (Patient taking differently: Take 10 mg by mouth at bedtime. ) 30 tablet 0  . multivitamin (RENA-VIT) TABS tablet Take 1 tablet by mouth at bedtime.  0  . Naphazoline HCl (CLEAR EYES OP) Place 1 drop into both eyes daily as needed (dryness).    . nitroGLYCERIN (NITROSTAT) 0.4 MG SL tablet Place 0.4 mg under the tongue every 5 (five) minutes as needed for chest pain.    . Nutritional Supplements (FEEDING SUPPLEMENT, NEPRO CARB STEADY,) LIQD Take 237 mLs by mouth 3 (three) times daily between meals. (Patient taking differently: Take 237 mLs by mouth daily. )  0  . ondansetron (ZOFRAN) 4 MG tablet Take 4 mg by mouth every 6 (six) hours as needed for nausea or vomiting.    . pantoprazole (PROTONIX) 40 MG tablet Take 1 tablet (40 mg total) by mouth 2 (two) times daily before a meal. (Patient taking differently: Take 40 mg by mouth daily before breakfast. ) 60 tablet 0  . polyethylene glycol (MIRALAX / GLYCOLAX) packet Take 17 g by mouth daily. (Patient taking differently: Take 17 g by mouth daily as needed for mild constipation. ) 14 each 0  . tiZANidine (ZANAFLEX) 4 MG tablet Take 4 mg by mouth every 8 (eight) hours as needed for muscle spasms.    . traZODone (DESYREL) 100 MG tablet Take 100 mg by mouth at bedtime.    Marland Kitchen warfarin (COUMADIN) 3 MG tablet Take 1 tablet (3 mg total) by mouth daily at 6 PM. 30 tablet 0   No current facility-administered medications for this visit.   On ROS today: 10 system ROS is negative unless otherwise noted in HPI  Physical Examination      Vitals:   06/03/19 1533  BP: (!) 167/95  Pulse: 78  Resp: 16  Temp: (!) 97.4 F (36.3 C)  TempSrc: Oral  SpO2: 91%   Weight: 116 lb (52.6 kg)  Height: 5\' 9"  (1.753 m)  Body mass index is 17.13 kg/m.        General Alert, O x 3, WD, NAD  Pulmonary Sym exp, good B air movt, CTA B  Cardiac irregular  Vascular Vessel Right Left    Radial Palpable Palpable    Brachial Palpable Palpable    Ulnar Not palpable Not palpable   Musculo-  skeletal incisions of left upper arm well-healed; palpable left radial pulse  Neurologic A&O; CN grossly intact   Medical Decision Making  Jocelyn Hill is a 69 y.o. female  who presents with ESRD requiring hemodialysis.  Incisions of left arm well-healed status post infected AV graft removal  Remaining sutures removed in office today  The ultimate goal is to have a new permanent access and remove right femoral TDC  Per Dr. Oneida Alar we will need to perform central venogram via right upper extremity  Based on venogram we will plan for new dialysis access  Patient is agreeable to proceed with the above procedure Dagoberto Ligas PA-C  [facility]  Office: [facility] MD: Donzetta Matters

## 2019-07-15 NOTE — Progress Notes (Signed)
Per Dr. Marcie Bal, no need to contact [employer].

## 2019-07-15 NOTE — Transfer of Care (Signed)
Immediate Anesthesia Transfer of Care Note  Patient: Jocelyn Hill  Procedure(s) Performed: ARTERIOVENOUS (AV) FISTULA CREATION (Right Arm Lower)  Patient Location: PACU  Anesthesia Type:MAC  Level of Consciousness: awake and alert   Airway & Oxygen Therapy: Patient Spontanous Breathing  Post-op Assessment: Report given to RN and Post -op Vital signs reviewed and stable  Post vital signs: Reviewed and stable  Last Vitals:  Vitals Value Taken Time  BP 116/63 07/15/19 0847  Temp    Pulse 59 07/15/19 0850  Resp 17 07/15/19 0850  SpO2 100 % 07/15/19 0850  Vitals shown include unvalidated device data.  Last Pain:  Vitals:   07/15/19 0648  TempSrc:   PainSc: 8          Complications: No apparent anesthesia complications

## 2019-07-15 NOTE — Op Note (Signed)
OPERATIVE NOTE   PROCEDURE: 1. right first stage basilic vein transposition (brachiobasilic arteriovenous fistula) placement  PRE-OPERATIVE DIAGNOSIS: ESRD  POST-OPERATIVE DIAGNOSIS: same  SURGEON: Marty Heck, MD  ASSISTANT(S): Leontine Locket, PA  ANESTHESIA: MAC  ESTIMATED BLOOD LOSS: Minimal  FINDING(S): 1.  Basilic vein: 3 mm, acceptable 2.  Brachial artery: 3 mm, atherosclerotic disease evident 3.  Venous outflow: palpable thrill  4.  Radial flow: dopplerable radial signal  SPECIMEN(S):  none  INDICATIONS:   Jocelyn Hill is a 69 y.o. female who presents with ESRD and need for permanent hemodialysis access.  The patient is scheduled for right first stage basilic vein transposition.  The patient is aware the risks include but are not limited to: bleeding, infection, steal syndrome, nerve damage, ischemic monomelic neuropathy, failure to mature, and need for additional procedures.  The patient is aware of the risks of the procedure and elects to proceed forward.   DESCRIPTION: After full informed written consent was obtained from the patient, the patient was brought back to the operating room and placed supine upon the operating table.  Prior to induction, the patient received IV antibiotics.   After obtaining adequate anesthesia, the patient was then prepped and draped in the standard fashion for a right arm access procedure.  I turned my attention first to identifying the patient's basilic vein and brachial artery.    Using SonoSite guidance, the location of these vessels were marked out on the skin.   At this point, I injected local anesthetic to obtain a field block of the antecubitum.  In total, I injected about 10 mL of 1% lidocaine without epinephrine.  I made a transverse incision just above the level of the antecubitum and dissected through the subcutaneous tissue and fascia to gain exposure of the brachial artery.  This was noted to be 3 mm in diameter  externally.  This was dissected out proximally and distally and controlled with vessel loops .  I then dissected out the basilic vein.  This was noted to be 3 mm in diameter externally.  The distal segment of the vein was ligated with a  2-0 silk, and the vein was transected.  The proximal segment was interrogated with serial dilators.  The vein accepted up to a 3.5 mm dilator without any difficulty.  I then instilled the heparinized saline into the vein and clamped it.  The patient was given 3000 units IV heparin. At this point, I reset my exposure of the brachial artery and placed the artery under tension proximally and distally.  I made an arteriotomy with a #11 blade, and then I extended the arteriotomy with a Potts scissor.  I injected heparinized saline proximal and distal to this arteriotomy.  The vein was then sewn to the artery in an end-to-side configuration with a running stitch of 6-0 Prolene.  Prior to completing this anastomosis, I allowed the vein and artery to backbleed.  There was no evidence of clot from any vessels.  I completed the anastomosis in the usual fashion and then released all vessel loops and clamps.    There was a palpable thrill in the venous outflow, and there was a dopplerable radial signal.  At this point, I irrigated out the surgical wound.  There was no further active bleeding.  The subcutaneous tissue was reapproximated with a running stitch of 3-0 Vicryl.  The skin was then reapproximated with a running subcuticular stitch of 4-0 Monocryl.  The skin was then cleaned,  dried, and reinforced with Dermabond.  The patient tolerated this procedure well.   COMPLICATIONS: None  CONDITION: Stable   Marty Heck MD [facility] [place] Office: [phone]  07/15/2019, 8:42 AM

## 2019-07-15 NOTE — Discharge Instructions (Signed)
° °  [facility]  Discharge Instructions  AV Fistula or Graft Surgery for Dialysis Access  Please refer to the following instructions for your post-procedure care. Your surgeon or physician assistant will discuss any changes with you.  Activity  You may drive the day following your surgery, if you are comfortable and no longer taking prescription pain medication. Resume full activity as the soreness in your incision resolves.  Bathing/Showering  You may shower after you go home. Keep your incision dry for 48 hours. Do not soak in a bathtub, hot tub, or swim until the incision heals completely. You may not shower if you have a hemodialysis catheter.  Incision Care  Clean your incision with mild soap and water after 48 hours. Pat the area dry with a clean towel. You do not need a bandage unless otherwise instructed. Do not apply any ointments or creams to your incision. You may have skin glue on your incision. Do not peel it off. It will come off on its own in about one week. Your arm may swell a bit after surgery. To reduce swelling use pillows to elevate your arm so it is above your heart. Your doctor will tell you if you need to lightly wrap your arm with an ACE bandage.  Diet  Resume your normal diet. There are not special food restrictions following this procedure. In order to heal from your surgery, it is CRITICAL to get adequate nutrition. Your body requires vitamins, minerals, and protein. Vegetables are the best source of vitamins and minerals. Vegetables also provide the perfect balance of protein. Processed food has little nutritional value, so try to avoid this.  Medications  Resume taking all of your medications. If your incision is causing pain, you may take over-the counter pain relievers such as acetaminophen (Tylenol). If you were prescribed a stronger pain medication, please be aware these medications can cause nausea and constipation. Prevent  nausea by taking the medication with a snack or meal. Avoid constipation by drinking plenty of fluids and eating foods with high amount of fiber, such as fruits, vegetables, and grains.  Do not take Tylenol if you are taking prescription pain medications.  Follow up Your surgeon may want to see you in the office following your access surgery. If so, this will be arranged at the time of your surgery.  Please call us immediately for any of the following conditions:   Increased pain, redness, drainage (pus) from your incision site  Fever of 101 degrees or higher  Severe or worsening pain at your incision site  Hand pain or numbness.   Reduce your risk of vascular disease:   Stop smoking. If you would like help, call [facility] at 1-800-QUIT-NOW 318-184-5896) or [place] at [place] your cholesterol  Maintain a desired weight  Control your diabetes  Keep your blood pressure down  Dialysis  It will take several weeks to several months for your new dialysis access to be ready for use. Your surgeon will determine when it is okay to use it. Your nephrologist will continue to direct your dialysis. You can continue to use your Permcath until your new access is ready for use.   07/15/2019 ANDRESSA VESTER [id number] [birth year]  Surgeon(s): Marty Heck, MD  Procedure(s): 1st stage basilic vein transposition  x Do not stick fistula for 12 weeks    If you have any questions, please call the office at [phone].

## 2019-07-15 NOTE — Anesthesia Procedure Notes (Signed)
Procedure Name: MAC Date/Time: 07/15/2019 7:35 AM Performed by: Inda Coke, CRNA Pre-anesthesia Checklist: Patient identified, Emergency Drugs available, Suction available, Patient being monitored and Timeout performed Oxygen Delivery Method: Simple face mask Placement Confirmation: positive ETCO2

## 2019-07-15 NOTE — Progress Notes (Signed)
Pt with some right hand numbness in the pacu.  Right radial and ulnar signals unchanged from OR.  These were also unchanged with compression of the fistula as well as the palmar arch.   She states this is tolerable.   Pt evaluated by Dr. Carlis Abbott and thought this could be from the numbing medication intraoperatively or could be a component of steal.  He discussed this with her and also told her if this gets worse or her hand gets weaker, we need to be notified as soon as possible.  She expressed understanding of this.   She will f/u in 6 weeks with duplex to see Dr. 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 Carlis Abbott in the next 1-2 weeks to evaluate her hand numbness.  She will call sooner if it worsens.  Jocelyn Hill, [facility] 07/15/2019  9:24 AM

## 2019-07-16 NOTE — Anesthesia Postprocedure Evaluation (Signed)
Anesthesia Post Note  Patient: Jocelyn Hill  Procedure(s) Performed: ARTERIOVENOUS (AV) FISTULA CREATION (Right Arm Lower)     Patient location during evaluation: PACU Anesthesia Type: MAC Level of consciousness: awake and alert Pain management: pain level controlled Vital Signs Assessment: post-procedure vital signs reviewed and stable Respiratory status: spontaneous breathing, nonlabored ventilation, respiratory function stable and patient connected to nasal cannula oxygen Cardiovascular status: stable and blood pressure returned to baseline Postop Assessment: no apparent nausea or vomiting Anesthetic complications: no    Last Vitals:  Vitals:   07/15/19 0950 07/15/19 1000  BP: 120/68 117/73  Pulse: (!) 59 60  Resp: 16 16  Temp:  36.4 C  SpO2: 97% 97%    Last Pain:  Vitals:   07/15/19 1000  TempSrc:   PainSc: [name]

## 2019-07-20 DIAGNOSIS — Z20822 Contact with and (suspected) exposure to covid-19: Secondary | ICD-10-CM | POA: Insufficient documentation

## 2019-07-26 ENCOUNTER — Ambulatory Visit (INDEPENDENT_AMBULATORY_CARE_PROVIDER_SITE_OTHER): Payer: Medicare Other | Admitting: *Deleted

## 2019-07-26 DIAGNOSIS — I442 Atrioventricular block, complete: Secondary | ICD-10-CM

## 2019-07-27 LAB — CUP PACEART REMOTE DEVICE CHECK
Battery Remaining Longevity: 89 mo
Battery Remaining Percentage: 95.5 %
Battery Voltage: 2.99 V
Brady Statistic AP VP Percent: 30 %
Brady Statistic AP VS Percent: 1 %
Brady Statistic AS VP Percent: 69 %
Brady Statistic AS VS Percent: 1 %
Brady Statistic RA Percent Paced: 29 %
Date Time Interrogation Session: 20210125155219
Implantable Lead Implant Date: 20190830
Implantable Lead Implant Date: 20190830
Implantable Lead Implant Date: 20190830
Implantable Lead Location: 753858
Implantable Lead Location: 753859
Implantable Lead Location: 753860
Implantable Pulse Generator Implant Date: 20190830
Lead Channel Impedance Value: 360 Ohm
Lead Channel Impedance Value: 410 Ohm
Lead Channel Impedance Value: 660 Ohm
Lead Channel Pacing Threshold Amplitude: 0.5 V
Lead Channel Pacing Threshold Amplitude: 0.625 V
Lead Channel Pacing Threshold Amplitude: 1.25 V
Lead Channel Pacing Threshold Pulse Width: 0.5 ms
Lead Channel Pacing Threshold Pulse Width: 0.5 ms
Lead Channel Pacing Threshold Pulse Width: 0.8 ms
Lead Channel Sensing Intrinsic Amplitude: 1.7 mV
Lead Channel Sensing Intrinsic Amplitude: 11.4 mV
Lead Channel Setting Pacing Amplitude: 1.5 V
Lead Channel Setting Pacing Amplitude: 2 V
Lead Channel Setting Pacing Amplitude: 2.25 V
Lead Channel Setting Pacing Pulse Width: 0.5 ms
Lead Channel Setting Pacing Pulse Width: 0.8 ms
Lead Channel Setting Sensing Sensitivity: 2 mV
Pulse Gen Serial Number: 9043873

## 2019-08-01 ENCOUNTER — Encounter: Payer: Medicare Other | Admitting: Cardiology

## 2019-08-01 DIAGNOSIS — Z4802 Encounter for removal of sutures: Secondary | ICD-10-CM | POA: Insufficient documentation

## 2019-08-01 NOTE — Progress Notes (Deleted)
Electrophysiology Office Note   Date:  08/01/2019   ID:  Jocelyn Hill, DOB 1950/09/05, MRN [id number]  PCP:  [name], MD  Cardiologist:  Aundra Dubin Primary Electrophysiologist:  Constance Haw, MD    No chief complaint on file.    History of Present Illness: Jocelyn Hill is a 68 y.o. female who is being seen today for the evaluation of complete AV block at the request of Antil, Venia Carbon, MD. Presenting today for electrophysiology evaluation.  She has a history of mitral regurgitation, chronic diastolic heart failure, CAD status post MI, long-standing hypertension, diabetes, CKD stage IV-V, and stroke.  She presented to [facility] 03/12/2017 with hypoxic respiratory failure.  Echo showed normal LV systolic function with severe MR.  She was diuresed with Lasix over 20 pounds.  She was thought not to be a candidate for MVR at the time.  Hospital course was complicated by respiratory failure, renal failure, atrial fibrillation, retroperitoneal bleed, and right hydronephrosis.  She also had E. coli bacteremia.  She was admitted 02/09/18 through 03/05/2018 for CABG and MVR with bioprosthetic valve.  She underwent CRT-P implant due to complete heart block on 02/26/2018.  Today, denies symptoms of palpitations, chest pain, shortness of breath, orthopnea, PND, lower extremity edema, claudication, dizziness, presyncope, syncope, bleeding, or neurologic sequela. The patient is tolerating medications without difficulties. ***    Past Medical History:  Diagnosis Date  . Anemia    low iron  . Anxiety   . Arthritis   . Bronchitis   . CHF (congestive heart failure) ([place])   . Coronary artery disease   . Depression   . Diabetes mellitus without complication ([place])   . Diet-controlled diabetes mellitus ([place])   . Dyspnea    SOME   . Dysrhythmia    PAF  . ESRD (end stage renal disease) ([place]) 03/16/2017   MWF- [facility]  . Fibromyalgia   . Ganglion cyst    left wrist  . GERD  (gastroesophageal reflux disease)   . Headache    chronic headaches- in the past (07/14/2019)  . Heart failure ([place])   . Heart murmur   . Hyperlipidemia   . Hypertension   . Hypothyroidism   . Mitral regurgitation   . Myocardial infarction ([place])    3x last one 2008  . PAF (paroxysmal atrial fibrillation) ([place])   . Pneumonia 2020   x 2  . S/P Maze operation for atrial fibrillation 02/18/2018   Complete bilateral atrial lesion set using bipolar radiofrequency and cryothermy ablation with clipping of LA appendage  . S/P mitral valve replacement with bioprosthetic valve 02/18/2018   [facility] Mitral stented bovine pericardial tissue valve Model 7300 TFX Serial # [id number] Size 29  . Stroke [facility])    no lasting residual - ? 2014  . Umbilical hernia    Past Surgical History:  Procedure Laterality Date  . ABDOMINAL HYSTERECTOMY     PARTIALS  . AV FISTULA PLACEMENT Left 04/23/2018   Procedure: LEFT ARTERIOVENOUS (AV) GRAFT CREATION;  Surgeon: Marty Heck, MD;  Location: [place];  Service: Vascular;  Laterality: Left;  . AV FISTULA PLACEMENT Right 07/15/2019   Procedure: ARTERIOVENOUS (AV) FISTULA CREATION;  Surgeon: Marty Heck, MD;  Location: [place];  Service: Vascular;  Laterality: Right;  . BIV PACEMAKER INSERTION CRT-P N/A 02/26/2018   Procedure: BIV PACEMAKER INSERTION CRT-P;  Surgeon: Constance Haw, MD;  Location: [place] CV [facility];  Service: Cardiovascular;  Laterality: N/A;  .  CARDIAC CATHETERIZATION    . CESAREAN SECTION    . COLONOSCOPY    . CORONARY ARTERY BYPASS GRAFT N/A 02/18/2018   Procedure: CORONARY ARTERY BYPASS GRAFTING (CABG) WITH IMA. ENDOSCOPIC VEIN HARVEST. IMA TO LAD, SVG TO CIRC.;  Surgeon: Rexene Alberts, MD;  Location: [place];  Service: Open Heart Surgery;  Laterality: N/A;  . INSERTION OF DIALYSIS CATHETER N/A 02/18/2018   Procedure: PLACEMENT OF TEMPORARY HD CATHETER, POWER TRIALYSIS 13FR 20CM;  Surgeon: Rexene Alberts, MD;  Location: [place];  Service: Vascular;  Laterality: N/A;  . INSERTION OF DIALYSIS CATHETER Right 04/23/2018   Procedure: INSERTION OF DIALYSIS CATHETER;  Surgeon: Marty Heck, MD;  Location: [place];  Service: Vascular;  Laterality: Right;  . INSERTION OF DIALYSIS CATHETER Right 03/01/2019   Procedure: INSERTION OF DIALYSIS CATHETER RIGHT Femoral;  Surgeon: Elam Dutch, MD;  Location: [place];  Service: Vascular;  Laterality: Right;  . IR NEPHRO TUBE REMOV/FL  04/02/2017  . IR NEPHROSTOGRAM RIGHT THRU EXISTING ACCESS  04/02/2017  . IR NEPHROSTOMY PLACEMENT RIGHT  03/27/2017  . IR THORACENTESIS ASP PLEURAL SPACE W/IMG GUIDE  03/24/2017  . LIGATION ARTERIOVENOUS GORTEX GRAFT Left 03/01/2019   Procedure: LIGATION ARTERIOVENOUS GORTEX GRAFT Left arm;  Surgeon: Elam Dutch, MD;  Location: [place];  Service: Vascular;  Laterality: Left;  Marland Kitchen MAZE N/A 02/18/2018   Procedure: MAZE;  Surgeon: Rexene Alberts, MD;  Location: [place];  Service: Open Heart Surgery;  Laterality: N/A;  . MITRAL VALVE REPLACEMENT N/A 02/18/2018   Procedure: MITRAL VALVE (MV) REPLACEMENT;  Surgeon: Rexene Alberts, MD;  Location: [place];  Service: Open Heart Surgery;  Laterality: N/A;  . MULTIPLE EXTRACTIONS WITH ALVEOLOPLASTY N/A 11/05/2017   Procedure: Extraction of tooth #'s 4,5,7,8,9,10,12,14 and 29 with alveoloplasty and gross debridement of remaining teeth;  Surgeon: [name], DDS;  Location: [place];  Service: Oral Surgery;  Laterality: N/A;  . PATENT FORAMEN OVALE(PFO) CLOSURE N/A 02/18/2018   Procedure: PATENT FORAMEN OVALE (PFO) CLOSURE;  Surgeon: Rexene Alberts, MD;  Location: [place];  Service: Open Heart Surgery;  Laterality: N/A;  . REMOVAL OF GRAFT Left 04/13/2019   Procedure: REMOVAL OF INFECTED GRAFT LEFT UPPER ARM WITH VEIN PATCH OF LEFT BRACHIAL ARTERY;  Surgeon: Rosetta Posner, MD;  Location: [place];  Service: Vascular;  Laterality: Left;  . RIGHT/LEFT HEART CATH AND CORONARY ANGIOGRAPHY N/A 07/03/2017   Procedure:  RIGHT/LEFT HEART CATH AND CORONARY ANGIOGRAPHY;  Surgeon: Larey Dresser, MD;  Location: [place] [facility];  Service: Cardiovascular;  Laterality: N/A;  . TEE WITHOUT CARDIOVERSION N/A 10/08/2017   Procedure: TRANSESOPHAGEAL ECHOCARDIOGRAM (TEE);  Surgeon: Larey Dresser, MD;  Location: [facility] ENDOSCOPY;  Service: Cardiovascular;  Laterality: N/A;  . TEE WITHOUT CARDIOVERSION N/A 02/18/2018   Procedure: TRANSESOPHAGEAL ECHOCARDIOGRAM (TEE);  Surgeon: Rexene Alberts, MD;  Location: [place];  Service: Open Heart Surgery;  Laterality: N/A;  . TONSILLECTOMY    . TUBAL LIGATION    . UPPER EXTREMITY VENOGRAPHY Right 06/09/2019   Procedure: UPPER EXTREMITY VENOGRAPHY;  Surgeon: Marty Heck, MD;  Location: [place] [facility];  Service: Cardiovascular;  Laterality: Right;     Current Outpatient Medications  Medication Sig Dispense Refill  . acetaminophen (TYLENOL) 500 MG tablet Take 1,500-2,000 mg by mouth 2 (two) times daily as needed for moderate pain or headache.     . albuterol (PROVENTIL HFA;VENTOLIN HFA) 108 (90 Base) MCG/ACT inhaler Inhale 2 puffs into  the lungs every 6 (six) hours as needed for wheezing or shortness of breath.    . ALPRAZolam (XANAX) 0.25 MG tablet Take 1 tablet (0.25 mg total) by mouth at bedtime as needed for anxiety. 30 tablet 0  . amLODipine (NORVASC) 10 MG tablet Take 10 mg by mouth at bedtime.    Marland Kitchen aspirin EC 81 MG EC tablet Take 1 tablet (81 mg total) by mouth daily. (Patient taking differently: Take 81 mg by mouth at bedtime. ) 30 tablet 0  . atorvastatin (LIPITOR) 80 MG tablet Take 1 tablet (80 mg total) by mouth daily. (Patient taking differently: Take 80 mg by mouth at bedtime. ) 30 tablet 3  . b complex vitamins tablet Take 1 tablet by mouth at bedtime.    . busPIRone (BUSPAR) 15 MG tablet Take 1 tablet (15 mg total) by mouth 2 (two) times daily. 60 tablet 0  . calcitRIOL (ROCALTROL) 0.5 MCG capsule Take 1 capsule (0.5 mcg total) by mouth every Monday,  Wednesday, and Friday with hemodialysis.    Marland Kitchen carvedilol (COREG) 6.25 MG tablet Take 1 tablet (6.25 mg total) by mouth 2 (two) times daily. 60 tablet 0  . cholecalciferol (VITAMIN D) 1000 units tablet Take 1,000 Units by mouth at bedtime.     Marland Kitchen dextromethorphan-guaiFENesin (MUCINEX DM) 30-600 MG 12hr tablet Take 1 tablet by mouth 2 (two) times daily as needed for cough. 60 tablet 0  . diphenhydrAMINE (BENADRYL) 25 MG tablet Take 1 tablet (25 mg total) by mouth daily as needed for itching. 30 tablet 0  . doxazosin (CARDURA) 4 MG tablet Take 4-8 mg by mouth See admin instructions. Take one tablet (4 mg) by mouth every morning and two tablets (8 mg) at night    . gabapentin (NEURONTIN) 300 MG capsule Take 1 capsule (300 mg total) by mouth 3 (three) times a week. After dialysis on M/W/F 12 capsule 1  . heparin 1000 UNIT/ML injection 1 mL (1,000 Units total) by Intracatheter route every Monday, Wednesday, and Friday with hemodialysis. 1 mL   . hydrALAZINE (APRESOLINE) 100 MG tablet Take 1 tablet (100 mg total) by mouth 3 (three) times daily. 90 tablet 0  . isosorbide mononitrate (IMDUR) 120 MG 24 hr tablet Take 1 tablet (120 mg total) by mouth at bedtime. 30 tablet 0  . levothyroxine (SYNTHROID) 150 MCG tablet Take 150 mcg by mouth daily before breakfast.    . montelukast (SINGULAIR) 10 MG tablet Take 1 tablet (10 mg total) by mouth daily. (Patient taking differently: Take 10 mg by mouth at bedtime. ) 30 tablet 0  . multivitamin (RENA-VIT) TABS tablet Take 1 tablet by mouth at bedtime.  0  . Naphazoline HCl (CLEAR EYES OP) Place 1 drop into both eyes daily as needed (dryness).    . nitroGLYCERIN (NITROSTAT) 0.4 MG SL tablet Place 0.4 mg under the tongue every 5 (five) minutes as needed for chest pain.    . Nutritional Supplements (FEEDING SUPPLEMENT, NEPRO CARB STEADY,) LIQD Take 237 mLs by mouth 3 (three) times daily between meals. (Patient taking differently: Take 237 mLs by mouth daily. )  0  .  ondansetron (ZOFRAN) 4 MG tablet Take 4 mg by mouth every 6 (six) hours as needed for nausea or vomiting.    Marland Kitchen oxyCODONE (ROXICODONE) 5 MG immediate release tablet Take 1 tablet (5 mg total) by mouth every 6 (six) hours as needed. 6 tablet 0  . pantoprazole (PROTONIX) 40 MG tablet Take 1 tablet (40 mg total) by  mouth 2 (two) times daily before a meal. (Patient taking differently: Take 40 mg by mouth daily before breakfast. ) 60 tablet 0  . polyethylene glycol (MIRALAX / GLYCOLAX) packet Take 17 g by mouth daily. (Patient taking differently: Take 17 g by mouth daily as needed for mild constipation. ) 14 each 0  . tiZANidine (ZANAFLEX) 4 MG tablet Take 4 mg by mouth every 8 (eight) hours as needed for muscle spasms.    . traZODone (DESYREL) 100 MG tablet Take 100 mg by mouth at bedtime.    Marland Kitchen warfarin (COUMADIN) 3 MG tablet Take 1 tablet (3 mg total) by mouth daily at 6 PM. 30 tablet 0   No current facility-administered medications for this visit.    Allergies:   Ace inhibitors and Motrin ib [ibuprofen]   Social History:  The patient  reports that she quit smoking about 41 years ago. Her smoking use included cigarettes. She has never used smokeless tobacco. She reports previous drug use. She reports that she does not drink alcohol.   Family History:  The patient's family history includes Hypertension in her mother.    ROS:  Please see the history of present illness.   Otherwise, review of systems is positive for ***.   All other systems are reviewed and negative.   PHYSICAL EXAM: VS:  There were no vitals taken for this visit. , BMI There is no height or weight on file to calculate BMI. GEN: Well nourished, well developed, in no acute distress  HEENT: normal  Neck: no JVD, carotid bruits, or masses Cardiac: ***RRR; no murmurs, rubs, or gallops,no edema  Respiratory:  clear to auscultation bilaterally, normal work of breathing GI: soft, nontender, nondistended, + BS MS: no deformity or  atrophy  Skin: warm and dry, device site well healed Neuro:  Strength and sensation are intact Psych: euthymic mood, full affect  EKG:  EKG {ACTION; IS/IS [id number] ordered today. Personal review of the ekg ordered *** shows ***  Personal review of the device interrogation today. Results in [place]: 04/20/2019: Platelets 270 07/15/2019: BUN 45; Creatinine, Ser 4.90; Hemoglobin 11.2; Potassium 3.2; Sodium 136    Lipid Panel     Component Value Date/Time   CHOL 89 04/02/2018 1457   TRIG 63 04/02/2018 1457   HDL 46 04/02/2018 1457   CHOLHDL 1.9 04/02/2018 1457   VLDL 13 04/02/2018 1457   LDLCALC 30 04/02/2018 1457     Wt Readings from Last 3 Encounters:  07/15/19 113 lb (51.3 kg)  06/09/19 117 lb (53.1 kg)  06/03/19 116 lb (52.6 kg)      Other studies Reviewed: Additional studies/ records that were reviewed today include: TTE 02/22/18  Review of the above records today demonstrates:  - Left ventricle: The cavity size was normal. Wall thickness was   increased in a pattern of mild LVH. Global hypokinesis with   incoordinate septal motion. Systolic function was mildly reduced.   The estimated ejection fraction was in the range of 45% to 50%.   The study is not technically sufficient to allow evaluation of LV   diastolic function. - Mitral valve: Bioprosthetic MVR. No obstruction. Mobile   subvalvular density - may be ruptured cord or less likely   thrombus. Mean gradient (D): 3 mm Hg. Valve area by continuity   equation (using LVOT flow): 1.87 cm^2. - Left atrium: Severely dilated. - Right ventricle: The cavity size was mildly dilated. Mildly   reduced systolic function. -  Right atrium: Moderately dilated. - Atrial septum: No defect or patent foramen ovale was identified.   s/p PFO closure. - Tricuspid valve: There was moderate regurgitation. - Pulmonary arteries: PA peak pressure: 32 mm Hg (S). - Pericardium, extracardiac: A trivial pericardial  effusion was   identified posterior to the heart. Features were not consistent   with tamponade physiology. There was a left pleural effusion.   ASSESSMENT AND PLAN:  1.  Complete heart block: Status post [employer] Jude CRT-P implanted in 02/26/2018.  Device functioning appropriately.  No changes.***  2.  Chronic diastolic heart failure: EF 45 to 50%.  Volume status stable.  3.  Mitral regurgitation, severe: Status post bioprosthetic MVR/maze 12/19/2017.  Plan per primary cardiology.  4.  Paroxysmal atrial fibrillation: Currently on Eliquis.  Status post maze.  CHA2DS2-VASc of 4.  5.  Coronary artery disease: Status post two-vessel CABG.  No current chest pain.   Current medicines are reviewed at length with the patient today.   The patient does not have concerns regarding her medicines.  The following changes were made today:  ***  Labs/ tests ordered today include:  No orders of the defined types were placed in this encounter.    Disposition:   FU with Atharva Mirsky *** months  Signed, Ilse Billman Meredith Leeds, MD  08/01/2019 9:35 AM     [address] [address] [phone] (office) [phone] (fax)

## 2019-08-02 ENCOUNTER — Other Ambulatory Visit: Payer: Self-pay

## 2019-08-02 ENCOUNTER — Ambulatory Visit (INDEPENDENT_AMBULATORY_CARE_PROVIDER_SITE_OTHER): Payer: Self-pay | Admitting: Vascular Surgery

## 2019-08-02 ENCOUNTER — Encounter: Payer: Self-pay | Admitting: Vascular Surgery

## 2019-08-02 DIAGNOSIS — T82898A Other specified complication of vascular prosthetic devices, implants and grafts, initial encounter: Secondary | ICD-10-CM | POA: Insufficient documentation

## 2019-08-02 DIAGNOSIS — T82898D Other specified complication of vascular prosthetic devices, implants and grafts, subsequent encounter: Secondary | ICD-10-CM

## 2019-08-02 NOTE — Progress Notes (Signed)
Patient name: Jocelyn Hill MRN: [id number] DOB: 09-05-1950 Sex: female  REASON FOR VISIT: Post-op check for right hand numbness  HPI: DAMARIE MACHORRO is a 69 y.o. female with multiple medical problems including end-stage renal disease who presents for postop check after right first stage basilic vein transposition on 07/15/2019.  After surgery she has some numbness in her fingers in the recovery room.  She was moving her hand fine with good strength.  No numbness prior to surgery.  Ultimately we arranged short interval follow-up to see how her symptoms were progressing.  She continues to complain of numbness in all 5 fingers as well as some pain in the forearm and hand.  No weakness.  She previously underwent ligation of the left upper arm AV graft in the setting of steal and ultimately it required excision after it got infected.  Past Medical History:  Diagnosis Date  . Anemia    low iron  . Anxiety   . Arthritis   . Bronchitis   . CHF (congestive heart failure) ([place])   . Coronary artery disease   . Depression   . Diabetes mellitus without complication ([place])   . Diet-controlled diabetes mellitus ([place])   . Dyspnea    SOME   . Dysrhythmia    PAF  . ESRD (end stage renal disease) ([place]) 03/16/2017   MWF- [facility]  . Fibromyalgia   . Ganglion cyst    left wrist  . GERD (gastroesophageal reflux disease)   . Headache    chronic headaches- in the past (07/14/2019)  . Heart failure ([place])   . Heart murmur   . Hyperlipidemia   . Hypertension   . Hypothyroidism   . Mitral regurgitation   . Myocardial infarction ([place])    3x last one 2008  . PAF (paroxysmal atrial fibrillation) ([place])   . Pneumonia 2020   x 2  . S/P Maze operation for atrial fibrillation 02/18/2018   Complete bilateral atrial lesion set using bipolar radiofrequency and cryothermy ablation with clipping of LA appendage  . S/P mitral valve replacement with bioprosthetic valve 02/18/2018   [facility] Mitral stented  bovine pericardial tissue valve Model 7300 TFX Serial # [id number] Size 29  . Stroke [facility])    no lasting residual - ? 2014  . Umbilical hernia     Past Surgical History:  Procedure Laterality Date  . ABDOMINAL HYSTERECTOMY     PARTIALS  . AV FISTULA PLACEMENT Left 04/23/2018   Procedure: LEFT ARTERIOVENOUS (AV) GRAFT CREATION;  Surgeon: Marty Heck, MD;  Location: [place];  Service: Vascular;  Laterality: Left;  . AV FISTULA PLACEMENT Right 07/15/2019   Procedure: ARTERIOVENOUS (AV) FISTULA CREATION;  Surgeon: Marty Heck, MD;  Location: [place];  Service: Vascular;  Laterality: Right;  . BIV PACEMAKER INSERTION CRT-P N/A 02/26/2018   Procedure: BIV PACEMAKER INSERTION CRT-P;  Surgeon: Constance Haw, MD;  Location: [place] [facility];  Service: Cardiovascular;  Laterality: N/A;  . CARDIAC CATHETERIZATION    . CESAREAN SECTION    . COLONOSCOPY    . CORONARY ARTERY BYPASS GRAFT N/A 02/18/2018   Procedure: CORONARY ARTERY BYPASS GRAFTING (CABG) WITH IMA. ENDOSCOPIC VEIN HARVEST. IMA TO LAD, SVG TO CIRC.;  Surgeon: Rexene Alberts, MD;  Location: [place];  Service: Open Heart Surgery;  Laterality: N/A;  . INSERTION OF DIALYSIS CATHETER N/A 02/18/2018   Procedure: PLACEMENT OF TEMPORARY HD CATHETER, POWER TRIALYSIS 13FR 20CM;  Surgeon: Rexene Alberts, MD;  [facility];  Service: Vascular;  Laterality: N/A;  . INSERTION OF DIALYSIS CATHETER Right 04/23/2018   Procedure: INSERTION OF DIALYSIS CATHETER;  Surgeon: Marty Heck, MD;  Location: [place];  Service: Vascular;  Laterality: Right;  . INSERTION OF DIALYSIS CATHETER Right 03/01/2019   Procedure: INSERTION OF DIALYSIS CATHETER RIGHT Femoral;  Surgeon: Elam Dutch, MD;  Location: [place];  Service: Vascular;  Laterality: Right;  . IR NEPHRO TUBE REMOV/FL  04/02/2017  . IR NEPHROSTOGRAM RIGHT THRU EXISTING ACCESS  04/02/2017  . IR NEPHROSTOMY PLACEMENT RIGHT  03/27/2017  . IR THORACENTESIS ASP PLEURAL SPACE W/IMG  GUIDE  03/24/2017  . LIGATION ARTERIOVENOUS GORTEX GRAFT Left 03/01/2019   Procedure: LIGATION ARTERIOVENOUS GORTEX GRAFT Left arm;  Surgeon: Elam Dutch, MD;  Location: [place];  Service: Vascular;  Laterality: Left;  Marland Kitchen MAZE N/A 02/18/2018   Procedure: MAZE;  Surgeon: Rexene Alberts, MD;  Location: [place];  Service: Open Heart Surgery;  Laterality: N/A;  . MITRAL VALVE REPLACEMENT N/A 02/18/2018   Procedure: MITRAL VALVE (MV) REPLACEMENT;  Surgeon: Rexene Alberts, MD;  Location: [place];  Service: Open Heart Surgery;  Laterality: N/A;  . MULTIPLE EXTRACTIONS WITH ALVEOLOPLASTY N/A 11/05/2017   Procedure: Extraction of tooth #'s 4,5,7,8,9,10,12,14 and 29 with alveoloplasty and gross debridement of remaining teeth;  Surgeon: [name], DDS;  Location: [place];  Service: Oral Surgery;  Laterality: N/A;  . PATENT FORAMEN OVALE(PFO) CLOSURE N/A 02/18/2018   Procedure: PATENT FORAMEN OVALE (PFO) CLOSURE;  Surgeon: Rexene Alberts, MD;  Location: [place];  Service: Open Heart Surgery;  Laterality: N/A;  . REMOVAL OF GRAFT Left 04/13/2019   Procedure: REMOVAL OF INFECTED GRAFT LEFT UPPER ARM WITH VEIN PATCH OF LEFT BRACHIAL ARTERY;  Surgeon: Rosetta Posner, MD;  Location: [place];  Service: Vascular;  Laterality: Left;  . RIGHT/LEFT HEART CATH AND CORONARY ANGIOGRAPHY N/A 07/03/2017   Procedure: RIGHT/LEFT HEART CATH AND CORONARY ANGIOGRAPHY;  Surgeon: Larey Dresser, MD;  Location: [place] [facility];  Service: Cardiovascular;  Laterality: N/A;  . TEE WITHOUT CARDIOVERSION N/A 10/08/2017   Procedure: TRANSESOPHAGEAL ECHOCARDIOGRAM (TEE);  Surgeon: Larey Dresser, MD;  Location: [facility] ENDOSCOPY;  Service: Cardiovascular;  Laterality: N/A;  . TEE WITHOUT CARDIOVERSION N/A 02/18/2018   Procedure: TRANSESOPHAGEAL ECHOCARDIOGRAM (TEE);  Surgeon: Rexene Alberts, MD;  Location: [place];  Service: Open Heart Surgery;  Laterality: N/A;  . TONSILLECTOMY    . TUBAL LIGATION    . UPPER EXTREMITY VENOGRAPHY Right  06/09/2019   Procedure: UPPER EXTREMITY VENOGRAPHY;  Surgeon: Marty Heck, MD;  Location: [place] [facility];  Service: Cardiovascular;  Laterality: Right;    Family History  Problem Relation Age of Onset  . Hypertension Mother     SOCIAL HISTORY: Social History   Tobacco Use  . Smoking status: Former Smoker    Types: Cigarettes    Quit date: 1980    Years since quitting: 41.1  . Smokeless tobacco: Never Used  . Tobacco comment: short time  Substance Use Topics  . Alcohol use: No    Allergies  Allergen Reactions  . Ace Inhibitors Anaphylaxis and Swelling  . Motrin Ib [Ibuprofen] Anaphylaxis    Current Outpatient Medications  Medication Sig Dispense Refill  . acetaminophen (TYLENOL) 500 MG tablet Take 1,500-2,000 mg by mouth 2 (two) times daily as needed for moderate pain or headache.     . albuterol (PROVENTIL HFA;VENTOLIN HFA) 108 (90 Base) MCG/ACT inhaler  Inhale 2 puffs into the lungs every 6 (six) hours as needed for wheezing or shortness of breath.    . ALPRAZolam (XANAX) 0.25 MG tablet Take 1 tablet (0.25 mg total) by mouth at bedtime as needed for anxiety. 30 tablet 0  . amLODipine (NORVASC) 10 MG tablet Take 10 mg by mouth at bedtime.    Marland Kitchen aspirin EC 81 MG EC tablet Take 1 tablet (81 mg total) by mouth daily. (Patient taking differently: Take 81 mg by mouth at bedtime. ) 30 tablet 0  . atorvastatin (LIPITOR) 80 MG tablet Take 1 tablet (80 mg total) by mouth daily. (Patient taking differently: Take 80 mg by mouth at bedtime. ) 30 tablet 3  . b complex vitamins tablet Take 1 tablet by mouth at bedtime.    . busPIRone (BUSPAR) 15 MG tablet Take 1 tablet (15 mg total) by mouth 2 (two) times daily. 60 tablet 0  . calcitRIOL (ROCALTROL) 0.5 MCG capsule Take 1 capsule (0.5 mcg total) by mouth every Monday, Wednesday, and Friday with hemodialysis.    Marland Kitchen carvedilol (COREG) 6.25 MG tablet Take 1 tablet (6.25 mg total) by mouth 2 (two) times daily. 60 tablet 0  .  cholecalciferol (VITAMIN D) 1000 units tablet Take 1,000 Units by mouth at bedtime.     Marland Kitchen dextromethorphan-guaiFENesin (MUCINEX DM) 30-600 MG 12hr tablet Take 1 tablet by mouth 2 (two) times daily as needed for cough. 60 tablet 0  . diphenhydrAMINE (BENADRYL) 25 MG tablet Take 1 tablet (25 mg total) by mouth daily as needed for itching. 30 tablet 0  . doxazosin (CARDURA) 4 MG tablet Take 4-8 mg by mouth See admin instructions. Take one tablet (4 mg) by mouth every morning and two tablets (8 mg) at night    . gabapentin (NEURONTIN) 300 MG capsule Take 1 capsule (300 mg total) by mouth 3 (three) times a week. After dialysis on M/W/F 12 capsule 1  . heparin 1000 UNIT/ML injection 1 mL (1,000 Units total) by Intracatheter route every Monday, Wednesday, and Friday with hemodialysis. 1 mL   . hydrALAZINE (APRESOLINE) 100 MG tablet Take 1 tablet (100 mg total) by mouth 3 (three) times daily. 90 tablet 0  . isosorbide mononitrate (IMDUR) 120 MG 24 hr tablet Take 1 tablet (120 mg total) by mouth at bedtime. 30 tablet 0  . levothyroxine (SYNTHROID) 150 MCG tablet Take 150 mcg by mouth daily before breakfast.    . montelukast (SINGULAIR) 10 MG tablet Take 1 tablet (10 mg total) by mouth daily. (Patient taking differently: Take 10 mg by mouth at bedtime. ) 30 tablet 0  . multivitamin (RENA-VIT) TABS tablet Take 1 tablet by mouth at bedtime.  0  . Naphazoline HCl (CLEAR EYES OP) Place 1 drop into both eyes daily as needed (dryness).    . nitroGLYCERIN (NITROSTAT) 0.4 MG SL tablet Place 0.4 mg under the tongue every 5 (five) minutes as needed for chest pain.    . Nutritional Supplements (FEEDING SUPPLEMENT, NEPRO CARB STEADY,) LIQD Take 237 mLs by mouth 3 (three) times daily between meals. (Patient taking differently: Take 237 mLs by mouth daily. )  0  . ondansetron (ZOFRAN) 4 MG tablet Take 4 mg by mouth every 6 (six) hours as needed for nausea or vomiting.    . pantoprazole (PROTONIX) 40 MG tablet Take 1 tablet  (40 mg total) by mouth 2 (two) times daily before a meal. (Patient taking differently: Take 40 mg by mouth daily before breakfast. ) 60 tablet  0  . polyethylene glycol (MIRALAX / GLYCOLAX) packet Take 17 g by mouth daily. (Patient taking differently: Take 17 g by mouth daily as needed for mild constipation. ) 14 each 0  . tiZANidine (ZANAFLEX) 4 MG tablet Take 4 mg by mouth every 8 (eight) hours as needed for muscle spasms.    . traZODone (DESYREL) 100 MG tablet Take 100 mg by mouth at bedtime.    Marland Kitchen warfarin (COUMADIN) 3 MG tablet Take 1 tablet (3 mg total) by mouth daily at 6 PM. 30 tablet 0  . oxyCODONE (ROXICODONE) 5 MG immediate release tablet Take 1 tablet (5 mg total) by mouth every 6 (six) hours as needed. (Patient not taking: Reported on 08/02/2019) 6 tablet 0   No current facility-administered medications for this visit.    REVIEW OF SYSTEMS:  [X]  denotes positive finding, [ ]  denotes negative finding Cardiac  Comments:  Chest pain or chest pressure:    Shortness of breath upon exertion:    Short of breath when lying flat:    Irregular heart rhythm:        Vascular    Pain in calf, thigh, or hip brought on by ambulation:    Pain in feet at night that wakes you up from your sleep:     Blood clot in your veins:    Leg swelling:         Pulmonary    Oxygen at home:    Productive cough:     Wheezing:         Neurologic    Sudden weakness in arms or legs:     Sudden numbness in arms or legs:     Sudden onset of difficulty speaking or slurred speech:    Temporary loss of vision in one eye:     Problems with dizziness:         Gastrointestinal    Blood in stool:     Vomited blood:         Genitourinary    Burning when urinating:     Blood in urine:        Psychiatric    Major depression:         Hematologic    Bleeding problems:    Problems with blood clotting too easily:        Skin    Rashes or ulcers:        Constitutional    Fever or chills:      PHYSICAL  EXAM: Vitals:   08/02/19 1109  BP: (!) 154/72  Pulse: 86  Resp: 14  Temp: 97.9 F (36.6 C)  TempSrc: Temporal  SpO2: 99%  Weight: 118 lb (53.5 kg)  Height: 5\' 8"  (1.727 m)    GENERAL: The patient is a well-nourished female, in no acute distress. The vital signs are documented above. CARDIAC: There is a regular rate and rhythm.  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  Discussed with the patient that certainly if she feels her symptoms are not tolerable would likely favor ligation of her fistula at this time since it has not been transposed and will require another stage.  She does not  feel that she is at a point she wants to have her fistula ligated and would like to monitor her symptoms and think about it.  She has good strength in the hand and no tissue loss otherwise.  She is already scheduled to see me on 08/30/2019 with a fistula duplex in order to make plans for second stage fistula.  She will let us know if it gets worse.   Marty Heck, MD [facility] Office: [phone]

## 2019-08-05 DIAGNOSIS — R531 Weakness: Secondary | ICD-10-CM | POA: Insufficient documentation

## 2019-08-22 ENCOUNTER — Encounter: Payer: Medicare Other | Admitting: Cardiology

## 2019-08-29 ENCOUNTER — Telehealth (HOSPITAL_COMMUNITY): Payer: Self-pay

## 2019-08-29 NOTE — Telephone Encounter (Signed)
The above patient or their representative was contacted and gave the following answers to these questions:         Do you have any of the following symptoms?    NO  Fever                    Cough                   Shortness of breath  Do  you have any of the following other symptoms?    muscle pain         vomiting,        diarrhea        rash         weakness        red eye        abdominal pain         bruising          bruising or bleeding              joint pain           severe headache    Have you been in contact with someone who was or has been sick in the past 2 weeks?  NO  Yes                 Unsure                         Unable to assess   Does the person that you were in contact with have any of the following symptoms?   Cough         shortness of breath           muscle pain         vomiting,            diarrhea            rash            weakness           fever            red eye           abdominal pain           bruising  or  bleeding                joint pain                severe headache                 COMMENTS OR ACTION PLAN FOR THIS PATIENT:        ALL QUESTIONS WHERE ANSWERED/[facility]

## 2019-08-30 ENCOUNTER — Ambulatory Visit (HOSPITAL_COMMUNITY)
Admission: RE | Admit: 2019-08-30 | Discharge: 2019-08-30 | Disposition: A | Discharge status: Home / Self Care | Payer: Medicare Other | Source: Ambulatory Visit | Attending: Vascular Surgery | Admitting: Vascular Surgery

## 2019-08-30 ENCOUNTER — Other Ambulatory Visit: Payer: Self-pay

## 2019-08-30 ENCOUNTER — Ambulatory Visit (INDEPENDENT_AMBULATORY_CARE_PROVIDER_SITE_OTHER): Payer: Self-pay | Admitting: Vascular Surgery

## 2019-08-30 VITALS — BP 135/66 | HR 69 | Temp 97.7°F | Resp 14 | Ht 69.0 in | Wt 120.0 lb

## 2019-08-30 DIAGNOSIS — N186 End stage renal disease: Secondary | ICD-10-CM

## 2019-08-30 DIAGNOSIS — Z992 Dependence on renal dialysis: Secondary | ICD-10-CM | POA: Insufficient documentation

## 2019-08-30 NOTE — Progress Notes (Signed)
Patient name: Jocelyn Hill MRN: [id number] DOB: 1951-03-20 Sex: female  REASON FOR VISIT: Follow-up after right 1st stage BVT  HPI: Jocelyn Hill is a 69 y.o. female with multiple medical problems including end-stage renal disease who presents for follow-up after right first stage basilic vein transposition on 07/15/2019.  We previously saw her shortly after surgery when she had some numbness in her right hand consistent with steal syndrome.  Ultimately this was tolerable and she presents for fistula duplex today after the fistula has had time to mature.  Continues to have numbness in the right fingers.  No weakness.  No tissue loss.  She previously underwent ligation of the left upper arm AV graft in the setting of steal and ultimately it required excision after it got infected.  Past Medical History:  Diagnosis Date  . Anemia    low iron  . Anxiety   . Arthritis   . Bronchitis   . CHF (congestive heart failure) ([place])   . Coronary artery disease   . Depression   . Diabetes mellitus without complication ([place])   . Diet-controlled diabetes mellitus ([place])   . Dyspnea    SOME   . Dysrhythmia    PAF  . ESRD (end stage renal disease) ([place]) 03/16/2017   MWF- [facility]  . Fibromyalgia   . Ganglion cyst    left wrist  . GERD (gastroesophageal reflux disease)   . Headache    chronic headaches- in the past (07/14/2019)  . Heart failure ([place])   . Heart murmur   . Hyperlipidemia   . Hypertension   . Hypothyroidism   . Mitral regurgitation   . Myocardial infarction ([place])    3x last one 2008  . PAF (paroxysmal atrial fibrillation) ([place])   . Pneumonia 2020   x 2  . S/P Maze operation for atrial fibrillation 02/18/2018   Complete bilateral atrial lesion set using bipolar radiofrequency and cryothermy ablation with clipping of LA appendage  . S/P mitral valve replacement with bioprosthetic valve 02/18/2018   [facility] Mitral stented bovine pericardial tissue valve Model 7300 TFX Serial  # [id number] Size 29  . Stroke [facility])    no lasting residual - ? 2014  . Umbilical hernia     Past Surgical History:  Procedure Laterality Date  . ABDOMINAL HYSTERECTOMY     PARTIALS  . AV FISTULA PLACEMENT Left 04/23/2018   Procedure: LEFT ARTERIOVENOUS (AV) GRAFT CREATION;  Surgeon: Marty Heck, MD;  Location: [place];  Service: Vascular;  Laterality: Left;  . AV FISTULA PLACEMENT Right 07/15/2019   Procedure: ARTERIOVENOUS (AV) FISTULA CREATION;  Surgeon: Marty Heck, MD;  Location: [place];  Service: Vascular;  Laterality: Right;  . BIV PACEMAKER INSERTION CRT-P N/A 02/26/2018   Procedure: BIV PACEMAKER INSERTION CRT-P;  Surgeon: Constance Haw, MD;  Location: [place] [facility];  Service: Cardiovascular;  Laterality: N/A;  . CARDIAC CATHETERIZATION    . CESAREAN SECTION    . COLONOSCOPY    . CORONARY ARTERY BYPASS GRAFT N/A 02/18/2018   Procedure: CORONARY ARTERY BYPASS GRAFTING (CABG) WITH IMA. ENDOSCOPIC VEIN HARVEST. IMA TO LAD, SVG TO CIRC.;  Surgeon: Rexene Alberts, MD;  Location: [place];  Service: Open Heart Surgery;  Laterality: N/A;  . INSERTION OF DIALYSIS CATHETER N/A 02/18/2018   Procedure: PLACEMENT OF TEMPORARY HD CATHETER, POWER TRIALYSIS 13FR 20CM;  Surgeon: Rexene Alberts, MD;  Location: [place];  Service: Vascular;  Laterality: N/A;  . INSERTION  OF DIALYSIS CATHETER Right 04/23/2018   Procedure: INSERTION OF DIALYSIS CATHETER;  Surgeon: Marty Heck, MD;  Location: [place];  Service: Vascular;  Laterality: Right;  . INSERTION OF DIALYSIS CATHETER Right 03/01/2019   Procedure: INSERTION OF DIALYSIS CATHETER RIGHT Femoral;  Surgeon: Elam Dutch, MD;  Location: [place];  Service: Vascular;  Laterality: Right;  . IR NEPHRO TUBE REMOV/FL  04/02/2017  . IR NEPHROSTOGRAM RIGHT THRU EXISTING ACCESS  04/02/2017  . IR NEPHROSTOMY PLACEMENT RIGHT  03/27/2017  . IR THORACENTESIS ASP PLEURAL SPACE W/IMG GUIDE  03/24/2017  . LIGATION ARTERIOVENOUS GORTEX GRAFT  Left 03/01/2019   Procedure: LIGATION ARTERIOVENOUS GORTEX GRAFT Left arm;  Surgeon: Elam Dutch, MD;  Location: [place];  Service: Vascular;  Laterality: Left;  Marland Kitchen MAZE N/A 02/18/2018   Procedure: MAZE;  Surgeon: Rexene Alberts, MD;  Location: [place];  Service: Open Heart Surgery;  Laterality: N/A;  . MITRAL VALVE REPLACEMENT N/A 02/18/2018   Procedure: MITRAL VALVE (MV) REPLACEMENT;  Surgeon: Rexene Alberts, MD;  Location: [place];  Service: Open Heart Surgery;  Laterality: N/A;  . MULTIPLE EXTRACTIONS WITH ALVEOLOPLASTY N/A 11/05/2017   Procedure: Extraction of tooth #'s 4,5,7,8,9,10,12,14 and 29 with alveoloplasty and gross debridement of remaining teeth;  Surgeon: [name], DDS;  Location: [place];  Service: Oral Surgery;  Laterality: N/A;  . PATENT FORAMEN OVALE(PFO) CLOSURE N/A 02/18/2018   Procedure: PATENT FORAMEN OVALE (PFO) CLOSURE;  Surgeon: Rexene Alberts, MD;  Location: [place];  Service: Open Heart Surgery;  Laterality: N/A;  . REMOVAL OF GRAFT Left 04/13/2019   Procedure: REMOVAL OF INFECTED GRAFT LEFT UPPER ARM WITH VEIN PATCH OF LEFT BRACHIAL ARTERY;  Surgeon: Rosetta Posner, MD;  Location: [place];  Service: Vascular;  Laterality: Left;  . RIGHT/LEFT HEART CATH AND CORONARY ANGIOGRAPHY N/A 07/03/2017   Procedure: RIGHT/LEFT HEART CATH AND CORONARY ANGIOGRAPHY;  Surgeon: Larey Dresser, MD;  Location: [place] [facility];  Service: Cardiovascular;  Laterality: N/A;  . TEE WITHOUT CARDIOVERSION N/A 10/08/2017   Procedure: TRANSESOPHAGEAL ECHOCARDIOGRAM (TEE);  Surgeon: Larey Dresser, MD;  Location: [facility] ENDOSCOPY;  Service: Cardiovascular;  Laterality: N/A;  . TEE WITHOUT CARDIOVERSION N/A 02/18/2018   Procedure: TRANSESOPHAGEAL ECHOCARDIOGRAM (TEE);  Surgeon: Rexene Alberts, MD;  Location: [place];  Service: Open Heart Surgery;  Laterality: N/A;  . TONSILLECTOMY    . TUBAL LIGATION    . UPPER EXTREMITY VENOGRAPHY Right 06/09/2019   Procedure: UPPER EXTREMITY VENOGRAPHY;   Surgeon: Marty Heck, MD;  Location: [place] [facility];  Service: Cardiovascular;  Laterality: Right;    Family History  Problem Relation Age of Onset  . Hypertension Mother     SOCIAL HISTORY: Social History   Tobacco Use  . Smoking status: Former Smoker    Types: Cigarettes    Quit date: 1980    Years since quitting: 41.1  . Smokeless tobacco: Never Used  . Tobacco comment: short time  Substance Use Topics  . Alcohol use: No    Allergies  Allergen Reactions  . Ace Inhibitors Anaphylaxis and Swelling  . Motrin Ib [Ibuprofen] Anaphylaxis    Current Outpatient Medications  Medication Sig Dispense Refill  . acetaminophen (TYLENOL) 500 MG tablet Take 1,500-2,000 mg by mouth 2 (two) times daily as needed for moderate pain or headache.     . albuterol (PROVENTIL HFA;VENTOLIN HFA) 108 (90 Base) MCG/ACT inhaler Inhale 2 puffs into the lungs every 6 (six) hours as needed  for wheezing or shortness of breath.    . ALPRAZolam (XANAX) 0.25 MG tablet Take 1 tablet (0.25 mg total) by mouth at bedtime as needed for anxiety. 30 tablet 0  . amLODipine (NORVASC) 10 MG tablet Take 10 mg by mouth at bedtime.    Marland Kitchen aspirin EC 81 MG EC tablet Take 1 tablet (81 mg total) by mouth daily. (Patient taking differently: Take 81 mg by mouth at bedtime. ) 30 tablet 0  . atorvastatin (LIPITOR) 80 MG tablet Take 1 tablet (80 mg total) by mouth daily. (Patient taking differently: Take 80 mg by mouth at bedtime. ) 30 tablet 3  . b complex vitamins tablet Take 1 tablet by mouth at bedtime.    . busPIRone (BUSPAR) 15 MG tablet Take 1 tablet (15 mg total) by mouth 2 (two) times daily. 60 tablet 0  . calcitRIOL (ROCALTROL) 0.5 MCG capsule Take 1 capsule (0.5 mcg total) by mouth every Monday, Wednesday, and Friday with hemodialysis.    Marland Kitchen carvedilol (COREG) 6.25 MG tablet Take 1 tablet (6.25 mg total) by mouth 2 (two) times daily. 60 tablet 0  . cholecalciferol (VITAMIN D) 1000 units tablet Take 1,000 Units  by mouth at bedtime.     Marland Kitchen dextromethorphan-guaiFENesin (MUCINEX DM) 30-600 MG 12hr tablet Take 1 tablet by mouth 2 (two) times daily as needed for cough. 60 tablet 0  . diphenhydrAMINE (BENADRYL) 25 MG tablet Take 1 tablet (25 mg total) by mouth daily as needed for itching. 30 tablet 0  . doxazosin (CARDURA) 4 MG tablet Take 4-8 mg by mouth See admin instructions. Take one tablet (4 mg) by mouth every morning and two tablets (8 mg) at night    . gabapentin (NEURONTIN) 300 MG capsule Take 1 capsule (300 mg total) by mouth 3 (three) times a week. After dialysis on M/W/F 12 capsule 1  . heparin 1000 UNIT/ML injection 1 mL (1,000 Units total) by Intracatheter route every Monday, Wednesday, and Friday with hemodialysis. 1 mL   . hydrALAZINE (APRESOLINE) 100 MG tablet Take 1 tablet (100 mg total) by mouth 3 (three) times daily. 90 tablet 0  . isosorbide mononitrate (IMDUR) 120 MG 24 hr tablet Take 1 tablet (120 mg total) by mouth at bedtime. 30 tablet 0  . levothyroxine (SYNTHROID) 150 MCG tablet Take 150 mcg by mouth daily before breakfast.    . montelukast (SINGULAIR) 10 MG tablet Take 1 tablet (10 mg total) by mouth daily. (Patient taking differently: Take 10 mg by mouth at bedtime. ) 30 tablet 0  . multivitamin (RENA-VIT) TABS tablet Take 1 tablet by mouth at bedtime.  0  . Naphazoline HCl (CLEAR EYES OP) Place 1 drop into both eyes daily as needed (dryness).    . nitroGLYCERIN (NITROSTAT) 0.4 MG SL tablet Place 0.4 mg under the tongue every 5 (five) minutes as needed for chest pain.    . Nutritional Supplements (FEEDING SUPPLEMENT, NEPRO CARB STEADY,) LIQD Take 237 mLs by mouth 3 (three) times daily between meals. (Patient taking differently: Take 237 mLs by mouth daily. )  0  . ondansetron (ZOFRAN) 4 MG tablet Take 4 mg by mouth every 6 (six) hours as needed for nausea or vomiting.    . pantoprazole (PROTONIX) 40 MG tablet Take 1 tablet (40 mg total) by mouth 2 (two) times daily before a meal.  (Patient taking differently: Take 40 mg by mouth daily before breakfast. ) 60 tablet 0  . polyethylene glycol (MIRALAX / GLYCOLAX) packet Take 17 g  by mouth daily. (Patient taking differently: Take 17 g by mouth daily as needed for mild constipation. ) 14 each 0  . tiZANidine (ZANAFLEX) 4 MG tablet Take 4 mg by mouth every 8 (eight) hours as needed for muscle spasms.    . traZODone (DESYREL) 100 MG tablet Take 100 mg by mouth at bedtime.    Marland Kitchen warfarin (COUMADIN) 3 MG tablet Take 1 tablet (3 mg total) by mouth daily at 6 PM. 30 tablet 0  . oxyCODONE (ROXICODONE) 5 MG immediate release tablet Take 1 tablet (5 mg total) by mouth every 6 (six) hours as needed. (Patient not taking: Reported on 08/30/2019) 6 tablet 0   No current facility-administered medications for this visit.    REVIEW OF SYSTEMS:  [X]  denotes positive finding, [ ]  denotes negative finding Cardiac  Comments:  Chest pain or chest pressure:    Shortness of breath upon exertion:    Short of breath when lying flat:    Irregular heart rhythm:        Vascular    Pain in calf, thigh, or hip brought on by ambulation:    Pain in feet at night that wakes you up from your sleep:     Blood clot in your veins:    Leg swelling:         Pulmonary    Oxygen at home:    Productive cough:     Wheezing:         Neurologic    Sudden weakness in arms or legs:     Sudden numbness in arms or legs:     Sudden onset of difficulty speaking or slurred speech:    Temporary loss of vision in one eye:     Problems with dizziness:         Gastrointestinal    Blood in stool:     Vomited blood:         Genitourinary    Burning when urinating:     Blood in urine:        Psychiatric    Major depression:         Hematologic    Bleeding problems:    Problems with blood clotting too easily:        Skin    Rashes or ulcers:        Constitutional    Fever or chills:      PHYSICAL EXAM: Vitals:   08/30/19 1452  BP: 135/66  Pulse: 69    Resp: 14  Temp: 97.7 F (36.5 C)  TempSrc: Temporal  SpO2: 98%  Weight: 120 lb (54.4 kg)  Height: 5\' 9"  (1.753 m)    GENERAL: The patient is a well-nourished female, in no acute distress. The vital signs are documented above. CARDIAC: There is a regular rate and rhythm.  VASCULAR:  Right brachiobasilic fistula with good thrill Right arm incision well-healed Palpable right ulnar pulse  DATA:   None  Assessment/Plan:  69 year old female with end-stage renal disease that presents for follow-up and duplex after right first stage basilic vein transposition on 07/15/2019.  Fistula appears to have matured nicely based on duplex today.  I have recommended proceeding with second stage transposition in the right arm.  She does have some slight steal symptoms that is just numbness and we discussed the symptoms in detail and ultimately agreeable that this is tolerable at this time.  No weakness no tissue loss etc. Has a tunneled right femoral catheter for immediate dialysis  needs.  Will arrange on a nondialysis day.   Marty Heck, MD [facility] Office: [phone]

## 2019-08-31 ENCOUNTER — Other Ambulatory Visit: Payer: Self-pay

## 2019-09-02 ENCOUNTER — Telehealth: Payer: Self-pay

## 2019-09-02 NOTE — Telephone Encounter (Signed)
Pt called to reschedule surgery to 3/26 with a 0530 arrival time at [facility]. Her covid screening has been rescheduled. We discussed pre op instructions- NPO after midnight the night before surgery, Coumadin hold x5 days. Pt verbalized understanding; no further questions/concerns at this time.

## 2019-09-04 ENCOUNTER — Observation Stay (HOSPITAL_COMMUNITY): Payer: Medicare Other

## 2019-09-04 ENCOUNTER — Other Ambulatory Visit: Payer: Self-pay

## 2019-09-04 ENCOUNTER — Encounter (HOSPITAL_COMMUNITY): Payer: Self-pay | Admitting: *Deleted

## 2019-09-04 ENCOUNTER — Inpatient Hospital Stay (HOSPITAL_COMMUNITY)
Admission: EM | Admit: 2019-09-04 | Discharge: 2019-09-08 | DRG: 377 | Disposition: A | Discharge status: Home / Self Care | Payer: Medicare Other | Attending: Internal Medicine | Admitting: Internal Medicine

## 2019-09-04 DIAGNOSIS — Z20822 Contact with and (suspected) exposure to covid-19: Secondary | ICD-10-CM | POA: Diagnosis present

## 2019-09-04 DIAGNOSIS — E785 Hyperlipidemia, unspecified: Secondary | ICD-10-CM | POA: Diagnosis present

## 2019-09-04 DIAGNOSIS — Z8701 Personal history of pneumonia (recurrent): Secondary | ICD-10-CM

## 2019-09-04 DIAGNOSIS — N186 End stage renal disease: Secondary | ICD-10-CM | POA: Diagnosis not present

## 2019-09-04 DIAGNOSIS — K635 Polyp of colon: Secondary | ICD-10-CM | POA: Diagnosis present

## 2019-09-04 DIAGNOSIS — I10 Essential (primary) hypertension: Secondary | ICD-10-CM

## 2019-09-04 DIAGNOSIS — N2581 Secondary hyperparathyroidism of renal origin: Secondary | ICD-10-CM | POA: Diagnosis present

## 2019-09-04 DIAGNOSIS — R05 Cough: Secondary | ICD-10-CM

## 2019-09-04 DIAGNOSIS — Z992 Dependence on renal dialysis: Secondary | ICD-10-CM

## 2019-09-04 DIAGNOSIS — D696 Thrombocytopenia, unspecified: Secondary | ICD-10-CM | POA: Diagnosis not present

## 2019-09-04 DIAGNOSIS — D500 Iron deficiency anemia secondary to blood loss (chronic): Secondary | ICD-10-CM

## 2019-09-04 DIAGNOSIS — R791 Abnormal coagulation profile: Secondary | ICD-10-CM | POA: Diagnosis present

## 2019-09-04 DIAGNOSIS — Z8673 Personal history of transient ischemic attack (TIA), and cerebral infarction without residual deficits: Secondary | ICD-10-CM

## 2019-09-04 DIAGNOSIS — F329 Major depressive disorder, single episode, unspecified: Secondary | ICD-10-CM | POA: Diagnosis present

## 2019-09-04 DIAGNOSIS — I2510 Atherosclerotic heart disease of native coronary artery without angina pectoris: Secondary | ICD-10-CM | POA: Diagnosis not present

## 2019-09-04 DIAGNOSIS — I480 Paroxysmal atrial fibrillation: Secondary | ICD-10-CM | POA: Diagnosis present

## 2019-09-04 DIAGNOSIS — K2970 Gastritis, unspecified, without bleeding: Secondary | ICD-10-CM | POA: Diagnosis present

## 2019-09-04 DIAGNOSIS — Z888 Allergy status to other drugs, medicaments and biological substances status: Secondary | ICD-10-CM

## 2019-09-04 DIAGNOSIS — R011 Cardiac murmur, unspecified: Secondary | ICD-10-CM | POA: Diagnosis present

## 2019-09-04 DIAGNOSIS — Z951 Presence of aortocoronary bypass graft: Secondary | ICD-10-CM

## 2019-09-04 DIAGNOSIS — Z886 Allergy status to analgesic agent status: Secondary | ICD-10-CM

## 2019-09-04 DIAGNOSIS — Z87891 Personal history of nicotine dependence: Secondary | ICD-10-CM

## 2019-09-04 DIAGNOSIS — Z950 Presence of cardiac pacemaker: Secondary | ICD-10-CM

## 2019-09-04 DIAGNOSIS — K625 Hemorrhage of anus and rectum: Secondary | ICD-10-CM | POA: Diagnosis not present

## 2019-09-04 DIAGNOSIS — K922 Gastrointestinal hemorrhage, unspecified: Secondary | ICD-10-CM | POA: Diagnosis present

## 2019-09-04 DIAGNOSIS — Z953 Presence of xenogenic heart valve: Secondary | ICD-10-CM

## 2019-09-04 DIAGNOSIS — D62 Acute posthemorrhagic anemia: Secondary | ICD-10-CM | POA: Diagnosis present

## 2019-09-04 DIAGNOSIS — K5520 Angiodysplasia of colon without hemorrhage: Secondary | ICD-10-CM | POA: Diagnosis present

## 2019-09-04 DIAGNOSIS — I132 Hypertensive heart and chronic kidney disease with heart failure and with stage 5 chronic kidney disease, or end stage renal disease: Secondary | ICD-10-CM | POA: Diagnosis present

## 2019-09-04 DIAGNOSIS — Z8249 Family history of ischemic heart disease and other diseases of the circulatory system: Secondary | ICD-10-CM

## 2019-09-04 DIAGNOSIS — M1990 Unspecified osteoarthritis, unspecified site: Secondary | ICD-10-CM | POA: Diagnosis present

## 2019-09-04 DIAGNOSIS — K5731 Diverticulosis of large intestine without perforation or abscess with bleeding: Principal | ICD-10-CM | POA: Diagnosis present

## 2019-09-04 DIAGNOSIS — E039 Hypothyroidism, unspecified: Secondary | ICD-10-CM | POA: Diagnosis present

## 2019-09-04 DIAGNOSIS — Z23 Encounter for immunization: Secondary | ICD-10-CM

## 2019-09-04 DIAGNOSIS — Z7982 Long term (current) use of aspirin: Secondary | ICD-10-CM

## 2019-09-04 DIAGNOSIS — I5042 Chronic combined systolic (congestive) and diastolic (congestive) heart failure: Secondary | ICD-10-CM | POA: Diagnosis present

## 2019-09-04 DIAGNOSIS — R519 Headache, unspecified: Secondary | ICD-10-CM | POA: Diagnosis present

## 2019-09-04 DIAGNOSIS — Z8774 Personal history of (corrected) congenital malformations of heart and circulatory system: Secondary | ICD-10-CM

## 2019-09-04 DIAGNOSIS — K219 Gastro-esophageal reflux disease without esophagitis: Secondary | ICD-10-CM | POA: Diagnosis present

## 2019-09-04 DIAGNOSIS — Z79899 Other long term (current) drug therapy: Secondary | ICD-10-CM

## 2019-09-04 DIAGNOSIS — K621 Rectal polyp: Secondary | ICD-10-CM | POA: Diagnosis present

## 2019-09-04 DIAGNOSIS — I340 Nonrheumatic mitral (valve) insufficiency: Secondary | ICD-10-CM | POA: Diagnosis present

## 2019-09-04 DIAGNOSIS — I252 Old myocardial infarction: Secondary | ICD-10-CM

## 2019-09-04 DIAGNOSIS — R059 Cough, unspecified: Secondary | ICD-10-CM

## 2019-09-04 DIAGNOSIS — Z7901 Long term (current) use of anticoagulants: Secondary | ICD-10-CM

## 2019-09-04 DIAGNOSIS — E1122 Type 2 diabetes mellitus with diabetic chronic kidney disease: Secondary | ICD-10-CM | POA: Diagnosis present

## 2019-09-04 DIAGNOSIS — M797 Fibromyalgia: Secondary | ICD-10-CM | POA: Diagnosis present

## 2019-09-04 DIAGNOSIS — F419 Anxiety disorder, unspecified: Secondary | ICD-10-CM | POA: Diagnosis present

## 2019-09-04 DIAGNOSIS — Z90710 Acquired absence of both cervix and uterus: Secondary | ICD-10-CM

## 2019-09-04 LAB — CBC
HCT: 23.2 % — ABNORMAL LOW (ref 36.0–46.0)
HCT: 23.5 % — ABNORMAL LOW (ref 36.0–46.0)
Hemoglobin: 7.6 g/dL — ABNORMAL LOW (ref 12.0–15.0)
Hemoglobin: 7.9 g/dL — ABNORMAL LOW (ref 12.0–15.0)
MCH: 29 pg (ref 26.0–34.0)
MCH: 29 pg (ref 26.0–34.0)
MCHC: 32.8 g/dL (ref 30.0–36.0)
MCHC: 33.6 g/dL (ref 30.0–36.0)
MCV: 88.5 fL (ref 80.0–100.0)
MCV: 86.4 fL (ref 80.0–100.0)
Platelets: 151 10*3/uL (ref 150–400)
Platelets: 144 10*3/uL — ABNORMAL LOW (ref 150–400)
RBC: 2.62 MIL/uL — ABNORMAL LOW (ref 3.87–5.11)
RBC: 2.72 MIL/uL — ABNORMAL LOW (ref 3.87–5.11)
RDW: 14.5 % (ref 11.5–15.5)
RDW: 14.1 % (ref 11.5–15.5)
WBC: 6.6 10*3/uL (ref 4.0–10.5)
WBC: 6.9 10*3/uL (ref 4.0–10.5)
nRBC: 0 % (ref 0.0–0.2)
nRBC: 0 % (ref 0.0–0.2)

## 2019-09-04 LAB — COMPREHENSIVE METABOLIC PANEL
ALT: 15 U/L (ref 0–44)
AST: 18 U/L (ref 15–41)
Albumin: 3.4 g/dL — ABNORMAL LOW (ref 3.5–5.0)
Alkaline Phosphatase: 70 U/L (ref 38–126)
Anion gap: 12 (ref 5–15)
BUN: 67 mg/dL — ABNORMAL HIGH (ref 8–23)
CO2: 27 mmol/L (ref 22–32)
Calcium: 8.7 mg/dL — ABNORMAL LOW (ref 8.9–10.3)
Chloride: 101 mmol/L (ref 98–111)
Creatinine, Ser: 5.41 mg/dL — ABNORMAL HIGH (ref 0.44–1.00)
GFR calc Af Amer: 9 mL/min — ABNORMAL LOW (ref >60)
GFR calc non Af Amer: 8 mL/min — ABNORMAL LOW (ref >60)
Glucose, Bld: 175 mg/dL — ABNORMAL HIGH (ref 70–99)
Potassium: 4.1 mmol/L (ref 3.5–5.1)
Sodium: 140 mmol/L (ref 135–145)
Total Bilirubin: 0.8 mg/dL (ref 0.3–1.2)
Total Protein: 5.8 g/dL — ABNORMAL LOW (ref 6.5–8.1)

## 2019-09-04 LAB — PROTIME-INR
INR: 3.7 — ABNORMAL HIGH (ref 0.8–1.2)
Prothrombin Time: 36.4 seconds — ABNORMAL HIGH (ref 11.4–15.2)

## 2019-09-04 LAB — SARS CORONAVIRUS 2 (TAT 6-24 HRS): SARS Coronavirus 2: NEGATIVE

## 2019-09-04 LAB — HEMOGLOBIN AND HEMATOCRIT, BLOOD
HCT: 25.9 % — ABNORMAL LOW (ref 36.0–46.0)
Hemoglobin: 8.8 g/dL — ABNORMAL LOW (ref 12.0–15.0)

## 2019-09-04 LAB — PREPARE RBC (CROSSMATCH)

## 2019-09-04 MED ORDER — MONTELUKAST SODIUM 10 MG PO TABS
10.0000 mg | ORAL_TABLET | Freq: Every day | ORAL | Status: DC
  Administered 2019-09-04 – 2019-09-07 (×4): 10 mg via ORAL
  Filled 2019-09-04 (×4): qty 1

## 2019-09-04 MED ORDER — FLUTICASONE PROPIONATE 50 MCG/ACT NA SUSP
2.0000 | Freq: Every day | NASAL | Status: DC
  Administered 2019-09-04 – 2019-09-08 (×3): 2 via NASAL
  Filled 2019-09-04: qty 16

## 2019-09-04 MED ORDER — LEVOTHYROXINE SODIUM 100 MCG PO TABS
100.0000 ug | ORAL_TABLET | Freq: Every day | ORAL | Status: DC
  Administered 2019-09-05 – 2019-09-08 (×4): 100 ug via ORAL
  Filled 2019-09-04 (×4): qty 1

## 2019-09-04 MED ORDER — PNEUMOCOCCAL VAC POLYVALENT 25 MCG/0.5ML IJ INJ
0.5000 mL | INJECTION | INTRAMUSCULAR | Status: AC
  Administered 2019-09-07: 0.5 mL via INTRAMUSCULAR
  Filled 2019-09-04: qty 0.5

## 2019-09-04 MED ORDER — CALCITRIOL 0.5 MCG PO CAPS: 0.5000 ug | ORAL_CAPSULE | ORAL | Status: DC

## 2019-09-04 MED ORDER — ISOSORBIDE MONONITRATE ER 60 MG PO TB24
120.0000 mg | ORAL_TABLET | Freq: Every day | ORAL | Status: DC
  Administered 2019-09-04 – 2019-09-07 (×4): 120 mg via ORAL
  Filled 2019-09-04 (×4): qty 2

## 2019-09-04 MED ORDER — ONDANSETRON HCL 4 MG/2ML IJ SOLN
4.0000 mg | Freq: Four times a day (QID) | INTRAMUSCULAR | Status: DC | PRN
  Administered 2019-09-06: 4 mg via INTRAVENOUS

## 2019-09-04 MED ORDER — SODIUM CHLORIDE 0.9 % IV SOLN
10.0000 mL/h | Freq: Once | INTRAVENOUS | Status: AC
  Administered 2019-09-04: 10 mL/h via INTRAVENOUS

## 2019-09-04 MED ORDER — POLYETHYLENE GLYCOL 3350 17 G PO PACK
17.0000 g | PACK | Freq: Two times a day (BID) | ORAL | Status: DC
  Administered 2019-09-04 – 2019-09-07 (×5): 17 g via ORAL
  Filled 2019-09-04 (×7): qty 1

## 2019-09-04 MED ORDER — VITAMIN K1 10 MG/ML IJ SOLN
5.0000 mg | Freq: Once | INTRAVENOUS | Status: AC
  Administered 2019-09-04: 5 mg via INTRAVENOUS
  Filled 2019-09-04: qty 0.5

## 2019-09-04 MED ORDER — SODIUM CHLORIDE 0.9 % IV SOLN
80.0000 mg | Freq: Once | INTRAVENOUS | Status: DC
  Filled 2019-09-04: qty 80

## 2019-09-04 MED ORDER — SODIUM CHLORIDE 0.9% IV SOLUTION: Freq: Once | INTRAVENOUS | Status: AC

## 2019-09-04 MED ORDER — NITROGLYCERIN 0.4 MG SL SUBL: 0.4000 mg | SUBLINGUAL_TABLET | SUBLINGUAL | Status: DC | PRN

## 2019-09-04 MED ORDER — ATORVASTATIN CALCIUM 80 MG PO TABS
80.0000 mg | ORAL_TABLET | Freq: Every day | ORAL | Status: DC
  Administered 2019-09-04 – 2019-09-07 (×4): 80 mg via ORAL
  Filled 2019-09-04 (×5): qty 1

## 2019-09-04 MED ORDER — SODIUM CHLORIDE 0.9% FLUSH: 3.0000 mL | INTRAVENOUS | Status: DC | PRN

## 2019-09-04 MED ORDER — SODIUM CHLORIDE 0.9 % IV SOLN
8.0000 mg/h | INTRAVENOUS | Status: DC
  Administered 2019-09-04 – 2019-09-05 (×2): 8 mg/h via INTRAVENOUS
  Filled 2019-09-04 (×5): qty 80

## 2019-09-04 MED ORDER — CHLORHEXIDINE GLUCONATE CLOTH 2 % EX PADS
6.0000 | MEDICATED_PAD | Freq: Every day | CUTANEOUS | Status: DC
  Administered 2019-09-04 – 2019-09-06 (×2): 6 via TOPICAL

## 2019-09-04 MED ORDER — HYDRALAZINE HCL 50 MG PO TABS
100.0000 mg | ORAL_TABLET | Freq: Three times a day (TID) | ORAL | Status: DC
  Administered 2019-09-04 – 2019-09-08 (×10): 100 mg via ORAL
  Filled 2019-09-04 (×10): qty 2

## 2019-09-04 MED ORDER — ALBUTEROL SULFATE (2.5 MG/3ML) 0.083% IN NEBU: 3.0000 mL | INHALATION_SOLUTION | Freq: Four times a day (QID) | RESPIRATORY_TRACT | Status: DC | PRN

## 2019-09-04 MED ORDER — ONDANSETRON HCL 4 MG PO TABS: 4.0000 mg | ORAL_TABLET | Freq: Four times a day (QID) | ORAL | Status: DC | PRN

## 2019-09-04 MED ORDER — PANTOPRAZOLE SODIUM 40 MG IV SOLR: 40.0000 mg | Freq: Two times a day (BID) | INTRAVENOUS | Status: DC

## 2019-09-04 MED ORDER — GUAIFENESIN ER 600 MG PO TB12
600.0000 mg | ORAL_TABLET | Freq: Two times a day (BID) | ORAL | Status: DC
  Administered 2019-09-04 – 2019-09-08 (×7): 600 mg via ORAL
  Filled 2019-09-04 (×7): qty 1

## 2019-09-04 MED ORDER — MIRTAZAPINE 15 MG PO TABS
15.0000 mg | ORAL_TABLET | Freq: Every day | ORAL | Status: DC
  Administered 2019-09-04 – 2019-09-07 (×4): 15 mg via ORAL
  Filled 2019-09-04 (×4): qty 1

## 2019-09-04 MED ORDER — FENTANYL CITRATE (PF) 100 MCG/2ML IJ SOLN
25.0000 ug | INTRAMUSCULAR | Status: DC | PRN
  Administered 2019-09-04 – 2019-09-05 (×3): 25 ug via INTRAVENOUS
  Administered 2019-09-06: 50 ug via INTRAVENOUS
  Filled 2019-09-04 (×4): qty 2

## 2019-09-04 MED ORDER — ALPRAZOLAM 0.25 MG PO TABS
0.2500 mg | ORAL_TABLET | Freq: Every evening | ORAL | Status: DC | PRN
  Administered 2019-09-04: 0.25 mg via ORAL
  Filled 2019-09-04: qty 1

## 2019-09-04 MED ORDER — CARVEDILOL 6.25 MG PO TABS
6.2500 mg | ORAL_TABLET | Freq: Two times a day (BID) | ORAL | Status: DC
  Administered 2019-09-05 – 2019-09-08 (×5): 6.25 mg via ORAL
  Filled 2019-09-04 (×5): qty 1

## 2019-09-04 MED ORDER — SODIUM CHLORIDE 0.9% FLUSH
3.0000 mL | Freq: Two times a day (BID) | INTRAVENOUS | Status: DC
  Administered 2019-09-04 – 2019-09-07 (×7): 3 mL via INTRAVENOUS

## 2019-09-04 MED ORDER — SODIUM CHLORIDE 0.9 % IV SOLN: 250.0000 mL | INTRAVENOUS | Status: DC | PRN

## 2019-09-04 MED ORDER — SODIUM CHLORIDE 0.9% FLUSH
3.0000 mL | Freq: Two times a day (BID) | INTRAVENOUS | Status: DC
  Administered 2019-09-04 – 2019-09-07 (×5): 3 mL via INTRAVENOUS

## 2019-09-04 NOTE — ED Provider Notes (Signed)
[facility] EMERGENCY DEPARTMENT Provider Note   CSN: [id number] Arrival date & time: 09/04/19  0253     History Chief Complaint  Patient presents with  . Rectal Bleeding    Jocelyn Hill is a 69 y.o. female.  HPI     This is a 69 year old female with history of heart failure, diabetes, end-stage renal disease on dialysis Monday Wednesday and Friday (last dialyzed on Wednesday), hypertension, hyperlipidemia, on chronic anticoagulation for atrial fibrillation who presents with rectal bleeding.  Patient reports that she has noted passage of clots over the last 24 hours.  She has never had a GI bleed that she knows of.  She has noted some crampy abdominal discomfort.  Denies any nausea, vomiting, hematemesis.  Denies any NSAID use.  She is currently taking her Coumadin.  She does not feel dizzy.  No shortness of breath or chest pain.  Reports that she receives most of her care in [place] as she is from [place].  Due for a colonoscopy this month.  Past Medical History:  Diagnosis Date  . Anemia    low iron  . Anxiety   . Arthritis   . Bronchitis   . CHF (congestive heart failure) ([place])   . Coronary artery disease   . Depression   . Diabetes mellitus without complication ([place])   . Diet-controlled diabetes mellitus ([place])   . Dyspnea    SOME   . Dysrhythmia    PAF  . ESRD (end stage renal disease) ([place]) 03/16/2017   MWF- [facility]  . Fibromyalgia   . Ganglion cyst    left wrist  . GERD (gastroesophageal reflux disease)   . Headache    chronic headaches- in the past (07/14/2019)  . Heart failure ([place])   . Heart murmur   . Hyperlipidemia   . Hypertension   . Hypothyroidism   . Mitral regurgitation   . Myocardial infarction ([place])    3x last one 2008  . PAF (paroxysmal atrial fibrillation) ([place])   . Pneumonia 2020   x 2  . S/P Maze operation for atrial fibrillation 02/18/2018   Complete bilateral atrial lesion set using bipolar radiofrequency and  cryothermy ablation with clipping of LA appendage  . S/P mitral valve replacement with bioprosthetic valve 02/18/2018   [facility] Mitral stented bovine pericardial tissue valve Model 7300 TFX Serial # [id number] Size 29  . Stroke [facility])    no lasting residual - ? 2014  . Umbilical hernia     Patient Active Problem List   Diagnosis Date Noted  . Acute GI bleeding 09/04/2019  . Steal syndrome dialysis vascular access ([place]) 08/02/2019  . Malnutrition of moderate degree 04/15/2019  . Stroke ([place])   . Type II diabetes mellitus with renal manifestations ([place])   . CAD (coronary artery disease)   . GERD (gastroesophageal reflux disease)   . AV fistula infection ([place])   . Cough   . Other mechanical complication of other vascular grafts, subsequent encounter 03/22/2019  . Chills (without fever) 09/24/2018  . Other disorders of phosphorus metabolism 08/12/2018  . Encounter for immunization 07/28/2018  . Diarrhea, unspecified 07/19/2018  . Hypercalcemia 05/26/2018  . Coagulation defect, unspecified ([place]) 05/03/2018  . Dyspnea, unspecified 05/03/2018  . Hypokalemia 05/03/2018  . Iron deficiency anemia, unspecified 05/03/2018  . Pain, unspecified 05/03/2018  . Pruritus, unspecified 05/03/2018  . Anemia in chronic kidney disease 04/30/2018  . Atherosclerotic heart disease of native coronary artery without angina pectoris 04/30/2018  .  Nonrheumatic mitral valve disorder, unspecified 04/30/2018  . Anxiety disorder due to known physiological condition 04/30/2018  . ESRD on dialysis ([place]) 04/30/2018  . Ganglion, left wrist 04/30/2018  . Gastro-esophageal reflux disease without esophagitis 04/30/2018  . Headache 04/30/2018  . Personal history of transient ischemic attack (TIA), and cerebral infarction without residual deficits 04/30/2018  . Secondary hyperparathyroidism of renal origin ([place]) 04/30/2018  . Umbilical hernia without obstruction or gangrene 04/30/2018  . Left arm pain   . Vascular  dialysis catheter in place [facility])   . CKD (chronic kidney disease) stage 5, GFR less than 15 ml/min (HCC)   . CHF exacerbation ([place]) 04/17/2018  . CKD (chronic kidney disease) stage V requiring chronic dialysis ([place])   . Pleural effusion   . S/P biventricular cardiac pacemaker procedure 03/05/2018  . S/P CABG x 2 02/18/2018  . S/P mitral valve replacement with bioprosthetic valve + CABG x2 + maze procedure 02/18/2018  . H/O mitral valve replacement 02/18/2018  . S/P Maze operation for atrial fibrillation 02/18/2018  . Unspecified protein-calorie malnutrition ([place]) 02/11/2018  . Chest pain due to CAD ([place]) 02/09/2018  . Other specified arthritis, unspecified site 12/21/2017  . Non-ST elevation (NSTEMI) myocardial infarction ([place]) 11/05/2017  . Right lower lobe pneumonia 09/20/2017  . ATN (acute tubular necrosis) ([place]) 09/19/2017  . Periodontal disease 09/19/2017  . Anxiety 09/18/2017  . Type 2 diabetes mellitus with hyperglycemia, without long-term current use of insulin ([place]) 09/16/2017  . ESRD on hemodialysis ([place]) 09/16/2017  . Elevated troponin   . Controlled type 2 diabetes mellitus with diabetic nephropathy ([place])   . Protein-calorie malnutrition, severe 04/21/2017  . Substance abuse ([place])   . Chronic combined systolic and diastolic CHF (congestive heart failure) ([place])   . Pulmonary hypertension ([place])   . Severe mitral regurgitation   . Stage 4 chronic kidney disease ([place])   . Anemia due to GI blood loss 03/13/2017  . Hyperlipidemia, unspecified 03/12/2017  . Hypertension 03/12/2017  . Hypothyroidism 03/12/2017  . Depression 03/12/2017  . PAF (paroxysmal atrial fibrillation) ([place]) 03/12/2017  . Dysphagia 03/12/2017    Past Surgical History:  Procedure Laterality Date  . ABDOMINAL HYSTERECTOMY     PARTIALS  . AV FISTULA PLACEMENT Left 04/23/2018   Procedure: LEFT ARTERIOVENOUS (AV) GRAFT CREATION;  Surgeon: Marty Heck, MD;  Location: [facility];  Service: Vascular;   Laterality: Left;  . AV FISTULA PLACEMENT Right 07/15/2019   Procedure: ARTERIOVENOUS (AV) FISTULA CREATION;  Surgeon: Marty Heck, MD;  Location: [facility];  Service: Vascular;  Laterality: Right;  . BIV PACEMAKER INSERTION CRT-P N/A 02/26/2018   Procedure: BIV PACEMAKER INSERTION CRT-P;  Surgeon: Constance Haw, MD;  Location: [facility] CV LAB;  Service: Cardiovascular;  Laterality: N/A;  . CARDIAC CATHETERIZATION    . CESAREAN SECTION    . COLONOSCOPY    . CORONARY ARTERY BYPASS GRAFT N/A 02/18/2018   Procedure: CORONARY ARTERY BYPASS GRAFTING (CABG) WITH IMA. ENDOSCOPIC VEIN HARVEST. IMA TO LAD, SVG TO CIRC.;  Surgeon: Rexene Alberts, MD;  Location: [facility];  Service: Open Heart Surgery;  Laterality: N/A;  . INSERTION OF DIALYSIS CATHETER N/A 02/18/2018   Procedure: PLACEMENT OF TEMPORARY HD CATHETER, POWER TRIALYSIS 13FR 20CM;  Surgeon: Rexene Alberts, MD;  Location: [facility];  Service: Vascular;  Laterality: N/A;  . INSERTION OF DIALYSIS CATHETER Right 04/23/2018   Procedure: INSERTION OF DIALYSIS CATHETER;  Surgeon: Marty Heck, MD;  Location: [facility];  Service: Vascular;  Laterality:  Right;  Marland Kitchen INSERTION OF DIALYSIS CATHETER Right 03/01/2019   Procedure: INSERTION OF DIALYSIS CATHETER RIGHT Femoral;  Surgeon: Elam Dutch, MD;  Location: [facility];  Service: Vascular;  Laterality: Right;  . IR NEPHRO TUBE REMOV/FL  04/02/2017  . IR NEPHROSTOGRAM RIGHT THRU EXISTING ACCESS  04/02/2017  . IR NEPHROSTOMY PLACEMENT RIGHT  03/27/2017  . IR THORACENTESIS ASP PLEURAL SPACE W/IMG GUIDE  03/24/2017  . LIGATION ARTERIOVENOUS GORTEX GRAFT Left 03/01/2019   Procedure: LIGATION ARTERIOVENOUS GORTEX GRAFT Left arm;  Surgeon: Elam Dutch, MD;  Location: [facility];  Service: Vascular;  Laterality: Left;  Marland Kitchen MAZE N/A 02/18/2018   Procedure: MAZE;  Surgeon: Rexene Alberts, MD;  Location: [facility];  Service: Open Heart Surgery;  Laterality: N/A;  . MITRAL VALVE REPLACEMENT N/A 02/18/2018    Procedure: MITRAL VALVE (MV) REPLACEMENT;  Surgeon: Rexene Alberts, MD;  Location: [facility];  Service: Open Heart Surgery;  Laterality: N/A;  . MULTIPLE EXTRACTIONS WITH ALVEOLOPLASTY N/A 11/05/2017   Procedure: Extraction of tooth #'s 4,5,7,8,9,10,12,14 and 29 with alveoloplasty and gross debridement of remaining teeth;  Surgeon: [name], DDS;  Location: [facility];  Service: Oral Surgery;  Laterality: N/A;  . PATENT FORAMEN OVALE(PFO) CLOSURE N/A 02/18/2018   Procedure: PATENT FORAMEN OVALE (PFO) CLOSURE;  Surgeon: Rexene Alberts, MD;  Location: [facility];  Service: Open Heart Surgery;  Laterality: N/A;  . REMOVAL OF GRAFT Left 04/13/2019   Procedure: REMOVAL OF INFECTED GRAFT LEFT UPPER ARM WITH VEIN PATCH OF LEFT BRACHIAL ARTERY;  Surgeon: Rosetta Posner, MD;  Location: [facility];  Service: Vascular;  Laterality: Left;  . RIGHT/LEFT HEART CATH AND CORONARY ANGIOGRAPHY N/A 07/03/2017   Procedure: RIGHT/LEFT HEART CATH AND CORONARY ANGIOGRAPHY;  Surgeon: Larey Dresser, MD;  Location: [facility] CV LAB;  Service: Cardiovascular;  Laterality: N/A;  . TEE WITHOUT CARDIOVERSION N/A 10/08/2017   Procedure: TRANSESOPHAGEAL ECHOCARDIOGRAM (TEE);  Surgeon: Larey Dresser, MD;  Location[facility] ENDOSCOPY;  Service: Cardiovascular;  Laterality: N/A;  . TEE WITHOUT CARDIOVERSION N/A 02/18/2018   Procedure: TRANSESOPHAGEAL ECHOCARDIOGRAM (TEE);  Surgeon: Rexene Alberts, MD;  Location: [facility];  Service: Open Heart Surgery;  Laterality: N/A;  . TONSILLECTOMY    . TUBAL LIGATION    . UPPER EXTREMITY VENOGRAPHY Right 06/09/2019   Procedure: UPPER EXTREMITY VENOGRAPHY;  Surgeon: Marty Heck, MD;  Location: [facility] CV LAB;  Service: Cardiovascular;  Laterality: Right;     OB History   No obstetric history on file.     Family History  Problem Relation Age of Onset  . Hypertension Mother     Social History   Tobacco Use  . Smoking status: Former Smoker    Types: Cigarettes    Quit date: 1980      Years since quitting: 41.2  . Smokeless tobacco: Never Used  . Tobacco comment: short time  Substance Use Topics  . Alcohol use: No  . Drug use: Not Currently    Comment: marijuana in the past    Home Medications Prior to Admission medications   Medication Sig Start Date End Date Taking? Authorizing Provider  acetaminophen (TYLENOL) 500 MG tablet Take 1,500-2,000 mg by mouth 2 (two) times daily as needed for moderate pain or headache.    Yes [provider]  albuterol (PROVENTIL HFA;VENTOLIN HFA) 108 (90 Base) MCG/ACT inhaler Inhale 2 puffs into the lungs every 6 (six) hours as needed for wheezing or shortness of breath.   Yes  [provider]  ALPRAZolam Duanne Moron) 0.25 MG tablet Take 1 tablet (0.25 mg total) by mouth at bedtime as needed for anxiety. 04/16/18  Yes Georgiana Shore, NP  amLODipine (NORVASC) 10 MG tablet Take 10 mg by mouth at bedtime.   Yes [provider]  aspirin EC 81 MG EC tablet Take 1 tablet (81 mg total) by mouth daily. Patient taking differently: Take 81 mg by mouth at bedtime.  04/24/17  Yes Allie Bossier, MD  atorvastatin (LIPITOR) 80 MG tablet Take 1 tablet (80 mg total) by mouth daily. Patient taking differently: Take 80 mg by mouth at bedtime.  03/06/18  Yes Barrett, Erin R, PA-C  AURYXIA 1 GM 210 MG(Fe) tablet Take 210 mg by mouth daily.  08/23/19  Yes [provider]  b complex vitamins tablet Take 1 tablet by mouth at bedtime.   Yes [provider]  busPIRone (BUSPAR) 15 MG tablet Take 1 tablet (15 mg total) by mouth 2 (two) times daily. 04/23/17  Yes Allie Bossier, MD  calcitRIOL (ROCALTROL) 0.5 MCG capsule Take 1 capsule (0.5 mcg total) by mouth every Monday, Wednesday, and Friday with hemodialysis. 04/20/19  Yes Nicole Kindred A, DO  carvedilol (COREG) 6.25 MG tablet Take 1 tablet (6.25 mg total) by mouth 2 (two) times daily. 04/20/19  Yes Nicole Kindred A, DO  cholecalciferol (VITAMIN D) 1000 units tablet  Take 1,000 Units by mouth at bedtime.    Yes [provider]  dextromethorphan-guaiFENesin (MUCINEX DM) 30-600 MG 12hr tablet Take 1 tablet by mouth 2 (two) times daily as needed for cough. 04/20/19  Yes Nicole Kindred A, DO  diphenhydrAMINE (BENADRYL) 25 MG tablet Take 1 tablet (25 mg total) by mouth daily as needed for itching. 04/20/19  Yes Nicole Kindred A, DO  doxazosin (CARDURA) 4 MG tablet Take 4-8 mg by mouth See admin instructions. Take one tablet (4 mg) by mouth every morning and two tablets (8 mg) at night   Yes [provider]  gabapentin (NEURONTIN) 300 MG capsule Take 1 capsule (300 mg total) by mouth 3 (three) times a week. After dialysis on M/W/F Patient taking differently: Take 300 mg by mouth every Monday, Wednesday, and Friday. After dialysis 04/20/19  Yes Nicole Kindred A, DO  heparin 1000 UNIT/ML injection 1 mL (1,000 Units total) by Intracatheter route every Monday, Wednesday, and Friday with hemodialysis. 04/20/19  Yes Nicole Kindred A, DO  hydrALAZINE (APRESOLINE) 100 MG tablet Take 1 tablet (100 mg total) by mouth 3 (three) times daily. 04/20/19  Yes Nicole Kindred A, DO  isosorbide mononitrate (IMDUR) 120 MG 24 hr tablet Take 1 tablet (120 mg total) by mouth at bedtime. 04/20/19  Yes Ezekiel Slocumb, DO  levothyroxine (SYNTHROID) 100 MCG tablet Take 100 mcg by mouth daily before breakfast.   Yes [provider]  mirtazapine (REMERON) 15 MG tablet Take 15 mg by mouth at bedtime. 08/09/19  Yes [provider]  montelukast (SINGULAIR) 10 MG tablet Take 1 tablet (10 mg total) by mouth daily. Patient taking differently: Take 10 mg by mouth at bedtime.  04/24/17  Yes Allie Bossier, MD  multivitamin (RENA-VIT) TABS tablet Take 1 tablet by mouth at bedtime. 05/01/18  Yes Glenis Smoker, MD  nitroGLYCERIN (NITROSTAT) 0.4 MG SL tablet Place 0.4 mg under the tongue every 5 (five) minutes as needed for chest pain.   Yes [provider]  Nutritional Supplements (FEEDING SUPPLEMENT, NEPRO CARB STEADY,) LIQD Take 237 mLs by  mouth 3 (three) times daily between meals. Patient taking differently: Take 237 mLs by mouth daily.  05/01/18  Yes Glenis Smoker, MD  pantoprazole (PROTONIX) 40 MG tablet Take 1 tablet (40 mg total) by mouth 2 (two) times daily before a meal. Patient taking differently: Take 40 mg by mouth daily before breakfast.  04/23/17  Yes Allie Bossier, MD  polyethylene glycol [facility] Floria Raveling) packet Take 17 g by mouth daily. Patient taking differently: Take 17 g by mouth daily as needed for mild constipation.  05/02/18  Yes Glenis Smoker, MD  traZODone (DESYREL) 100 MG tablet Take 100 mg by mouth at bedtime.   Yes [provider]  warfarin (COUMADIN) 3 MG tablet Take 1 tablet (3 mg total) by mouth daily at 6 PM. Patient taking differently: Take 3-6 mg by mouth See admin instructions. Take 2 tablets on Monday, Wednesday and Friday then take 1 tablet all the other days 04/20/19  Yes Nicole Kindred A, DO  oxyCODONE (ROXICODONE) 5 MG immediate release tablet Take 1 tablet (5 mg total) by mouth every 6 (six) hours as needed. Patient not taking: Reported on 08/30/2019 07/15/19   Gabriel Earing, PA-C    Allergies    Ace inhibitors and Motrin ib [ibuprofen]  Review of Systems   Review of Systems  Constitutional: Negative for fever.  Respiratory: Negative for shortness of breath.   Cardiovascular: Negative for chest pain.  Gastrointestinal: Positive for abdominal pain and blood in stool. Negative for diarrhea, nausea and vomiting.  Genitourinary: Negative for dysuria.  Neurological: Negative for dizziness and numbness.  All other systems reviewed and are negative.   Physical Exam Updated Vital Signs BP (!) 161/72 (BP Location: Right Leg)   Pulse 77   Temp 97.7 F (36.5 C) (Oral)   Resp 16   Ht 1.753 m (5\' 9" )   Wt 56.7 kg   SpO2 98%   BMI 18.46 kg/m   Physical  Exam Vitals and nursing note reviewed.  Constitutional:      Appearance: She is well-developed.     Comments: Thin, chronically ill-appearing  HENT:     Head: Normocephalic and atraumatic.     Mouth/Throat:     Mouth: Mucous membranes are dry.  Eyes:     Pupils: Pupils are equal, round, and reactive to light.  Cardiovascular:     Rate and Rhythm: Normal rate and regular rhythm.     Heart sounds: Normal heart sounds.     Comments: Positive thrill right upper extremity fistula, catheter noted right femoral Pulmonary:     Effort: Pulmonary effort is normal. No respiratory distress.     Breath sounds: No wheezing.  Abdominal:     General: Bowel sounds are normal.     Palpations: Abdomen is soft.     Tenderness: There is abdominal tenderness.     Comments: Diffuse tenderness, no rebound or guarding  Genitourinary:    Comments: Gross clot noted Musculoskeletal:     Cervical back: Neck supple.     Right lower leg: No edema.     Left lower leg: No edema.  Skin:    General: Skin is warm and dry.  Neurological:     Mental Status: She is alert and oriented to person, place, and time.  Psychiatric:        Mood and Affect: Mood normal.     ED Results / Procedures / Treatments   Labs (all labs ordered are listed, but only abnormal results  are displayed) Labs Reviewed  COMPREHENSIVE METABOLIC PANEL - Abnormal; Notable for the following components:      Result Value   Glucose, Bld 175 (*)    BUN 67 (*)    Creatinine, Ser 5.41 (*)    Calcium 8.7 (*)    Total Protein 5.8 (*)    Albumin 3.4 (*)    GFR calc non Af Amer 8 (*)    GFR calc Af Amer 9 (*)    All other components within normal limits  CBC - Abnormal; Notable for the following components:   RBC 2.62 (*)    Hemoglobin 7.6 (*)    HCT 23.2 (*)    All other components within normal limits  PROTIME-INR - Abnormal; Notable for the following components:   Prothrombin Time 36.4 (*)    INR 3.7 (*)    All other components  within normal limits  SARS CORONAVIRUS 2 (TAT 6-24 HRS)  POC OCCULT BLOOD, ED  TYPE AND SCREEN  PREPARE RBC (CROSSMATCH)  PREPARE FRESH FROZEN PLASMA    EKG None  Radiology No results found.  Procedures Procedures (including critical care time)  CRITICAL CARE Performed by: Merryl Hacker   Total critical care time: 45 minutes  Critical care time was exclusive of separately billable procedures and treating other patients.  Critical care was necessary to treat or prevent imminent or life-threatening deterioration.  Critical care was time spent personally by me on the following activities: development of treatment plan with patient and/or surrogate as well as nursing, discussions with consultants, evaluation of patient's response to treatment, examination of patient, obtaining history from patient or surrogate, ordering and performing treatments and interventions, ordering and review of laboratory studies, ordering and review of radiographic studies, pulse oximetry and re-evaluation of patient's condition.   Medications Ordered in ED Medications  sodium chloride flush (NS) 0.9 % injection 3 mL (has no administration in time range)  sodium chloride flush (NS) 0.9 % injection 3 mL (has no administration in time range)  sodium chloride flush (NS) 0.9 % injection 3 mL (has no administration in time range)  0.9 %  sodium chloride infusion (has no administration in time range)  ondansetron (ZOFRAN) tablet 4 mg (has no administration in time range)    Or  ondansetron (ZOFRAN) injection 4 mg (has no administration in time range)  phytonadione (VITAMIN K) 5 mg in dextrose 5 % 50 mL IVPB (has no administration in time range)  pantoprazole (PROTONIX) 80 mg in sodium chloride 0.9 % 100 mL IVPB (has no administration in time range)  pantoprazole (PROTONIX) 80 mg in sodium chloride 0.9 % 100 mL (0.8 mg/mL) infusion (has no administration in time range)  pantoprazole (PROTONIX) injection  40 mg (has no administration in time range)  fentaNYL (SUBLIMAZE) injection 25-50 mcg (has no administration in time range)  0.9 %  sodium chloride infusion (10 mL/hr Intravenous New Bag/Given 09/04/19 0439)    ED Course  I have reviewed the triage vital signs and the nursing notes.  Pertinent labs & imaging results that were available during my care of the patient were reviewed by me and considered in my medical decision making (see chart for details).  Clinical Course as of Sep 03 616  Nancy Fetter Sep 04, 2019  0518 Spoke with Dr. Cristina Gong, gastroenterology.  Plan to reverse INR with vitamin K and FFP given concern for lower GI bleed and limited interventions with a low hemoglobin.  Patient remains hemodynamically stable.  Discussed with admitting  hospitalist.   [CH]    Clinical Course User Index [CH] Dorrance Sellick, Barbette Hair, MD   MDM Rules/Calculators/A&P                       Patient presents with bright red blood per rectum.  She is overall nontoxic and vital signs are reassuring.  She is on Coumadin.  She showed me a pad from home with dark clots in it.  She complains of abdominal pain but her abdominal exam is benign without signs of peritonitis.  Vital signs are reassuring and she is hemodynamically stable.  Hemoglobin is down to 7.1.  INR is elevated at 3.7.  Patient typed and screened.  1 unit of packed red blood cells ordered.  See clinical course above regarding discussion with gastroenterology regarding plan of care.  Blood products and vitamin K were ordered.  She was also placed on a Protonix drip.  She will be evaluated by gastroenterology.  Additionally she will be admitted to the hospitalist.  She remained hemodynamically stable on the emergency room.  Suspect acute lower GI bleed or diverticular bleed.  Lower suspicion for upper GI source.  Final Clinical Impression(s) / ED Diagnoses Final diagnoses:  Lower GI bleed    Rx / DC Orders ED Discharge Orders    None       Merryl Hacker, MD 09/04/19 [phone]

## 2019-09-04 NOTE — Care Management Obs Status (Signed)
[facility] NOTIFICATION   Patient Details  Name: Jocelyn Hill MRN: [id number] Date of Birth: Oct 19, 1950   Medicare Observation Status Notification Given:  Yes    Claudie Leach, RN 09/04/2019, 3:32 PM

## 2019-09-04 NOTE — Progress Notes (Addendum)
Patient admitted to the hospital early this morning  She is On coumadin for PAF, with h/o prosthetic heart valve, ESRD on HD, presents with acute anemia. GIB , Hgb down 3.5 g and INR 3.7. vitk/ 1 unit RBC and FFP  On ppi drip, hold asa as well endoscope scheduled on 3/9 She missed her dialysis on Friday due to not feeling good, I have case discussed with nephrology, nephrology to  Get her on dialysis schedule Nephrology/GI  Following  Addendum: she c/o diffuse ab pain, ab tender in all quadrant, soft, no guarding, no rebound, add on ct ab/pel , add on lipase  She c/o productive cough for a week, was seen at urgent care, was told to have sinus issues, lung with mild wheezing, will get cxr, nebs, mucinex, flonase.

## 2019-09-04 NOTE — ED Triage Notes (Addendum)
Pt reporting that she has had 3 episodes of blood clots in her stool. She has had generalized abdominal pain. Denies dizziness. She is on coumadin. Last dialysis was on Wednesday, she missed Friday d/t her covid results not being back yet.

## 2019-09-04 NOTE — H&P (Signed)
History and Physical    Jocelyn Hill NWG:[id number] DOB: 1951/06/26 DOA: 09/04/2019  PCP: [name], MD   Patient coming from: Home   Chief Complaint: Clots per rectum   HPI: Jocelyn Hill is a 69 y.o. female with medical history significant for ESRD on hemodialysis, PAF on warfarin, bioprosthetic mitral valve, coronary artery disease, chronic combined CHF, hypothyroidism, hypertension, chronic pain, and presented to the emergency department for evaluation blood clots per rectum.  Patient reports that her stool is always dark due to medications but then began to pass clots per rectum yesterday.  This has continued today without any vomiting, lightheadedness, or chest pain.  She has had some abdominal pain associated with this, generalized, cramping in character, and without any alleviating or exacerbating factors identified.  She denies any alcohol use, denies NSAID use, and reports that she takes Protonix daily.  She had a colonoscopy "years ago," reports that she was due for repeat screening colonoscopy later this month, and believes that aside from hemorrhoids, there were no abnormal findings on her developed colonoscopy to her knowledge.  ED Course: Upon arrival to the ED, patient is found to be afebrile, saturating well on room air, and with stable blood pressure.  Chemistry panel is notable for BUN of 67 CBC features a hemoglobin of 7.6, down from 11.2 in January.  INR is elevated to 3.7.  Type and screen was performed, 1 unit of is transfusing now, GI was consulted by the ED physician and recommended warfarin reversal and hospitalist admission.  Review of Systems:  All other systems reviewed and apart from HPI, are negative.  Past Medical History:  Diagnosis Date  . Anemia    low iron  . Anxiety   . Arthritis   . Bronchitis   . CHF (congestive heart failure) ([place])   . Coronary artery disease   . Depression   . Diabetes mellitus without complication ([place])   .  Diet-controlled diabetes mellitus ([place])   . Dyspnea    SOME   . Dysrhythmia    PAF  . ESRD (end stage renal disease) ([place]) 03/16/2017   MWF- [facility]  . Fibromyalgia   . Ganglion cyst    left wrist  . GERD (gastroesophageal reflux disease)   . Headache    chronic headaches- in the past (07/14/2019)  . Heart failure ([place])   . Heart murmur   . Hyperlipidemia   . Hypertension   . Hypothyroidism   . Mitral regurgitation   . Myocardial infarction ([place])    3x last one 2008  . PAF (paroxysmal atrial fibrillation) ([place])   . Pneumonia 2020   x 2  . S/P Maze operation for atrial fibrillation 02/18/2018   Complete bilateral atrial lesion set using bipolar radiofrequency and cryothermy ablation with clipping of LA appendage  . S/P mitral valve replacement with bioprosthetic valve 02/18/2018   [facility] Mitral stented bovine pericardial tissue valve Model 7300 TFX Serial # [id number] Size 29  . Stroke [facility])    no lasting residual - ? 2014  . Umbilical hernia     Past Surgical History:  Procedure Laterality Date  . ABDOMINAL HYSTERECTOMY     PARTIALS  . AV FISTULA PLACEMENT Left 04/23/2018   Procedure: LEFT ARTERIOVENOUS (AV) GRAFT CREATION;  Surgeon: Marty Heck, MD;  Location: [place];  Service: Vascular;  Laterality: Left;  . AV FISTULA PLACEMENT Right 07/15/2019   Procedure: ARTERIOVENOUS (AV) FISTULA CREATION;  Surgeon: Marty Heck, MD;  Location[facility];  Service: Vascular;  Laterality: Right;  . BIV PACEMAKER INSERTION CRT-P N/A 02/26/2018   Procedure: BIV PACEMAKER INSERTION CRT-P;  Surgeon: Constance Haw, MD;  Location: [place] [facility];  Service: Cardiovascular;  Laterality: N/A;  . CARDIAC CATHETERIZATION    . CESAREAN SECTION    . COLONOSCOPY    . CORONARY ARTERY BYPASS GRAFT N/A 02/18/2018   Procedure: CORONARY ARTERY BYPASS GRAFTING (CABG) WITH IMA. ENDOSCOPIC VEIN HARVEST. IMA TO LAD, SVG TO CIRC.;  Surgeon: Rexene Alberts, MD;  Location: [place];   Service: Open Heart Surgery;  Laterality: N/A;  . INSERTION OF DIALYSIS CATHETER N/A 02/18/2018   Procedure: PLACEMENT OF TEMPORARY HD CATHETER, POWER TRIALYSIS 13FR 20CM;  Surgeon: Rexene Alberts, MD;  Location: [place];  Service: Vascular;  Laterality: N/A;  . INSERTION OF DIALYSIS CATHETER Right 04/23/2018   Procedure: INSERTION OF DIALYSIS CATHETER;  Surgeon: Marty Heck, MD;  Location: [place];  Service: Vascular;  Laterality: Right;  . INSERTION OF DIALYSIS CATHETER Right 03/01/2019   Procedure: INSERTION OF DIALYSIS CATHETER RIGHT Femoral;  Surgeon: Elam Dutch, MD;  Location: [place];  Service: Vascular;  Laterality: Right;  . IR NEPHRO TUBE REMOV/FL  04/02/2017  . IR NEPHROSTOGRAM RIGHT THRU EXISTING ACCESS  04/02/2017  . IR NEPHROSTOMY PLACEMENT RIGHT  03/27/2017  . IR THORACENTESIS ASP PLEURAL SPACE W/IMG GUIDE  03/24/2017  . LIGATION ARTERIOVENOUS GORTEX GRAFT Left 03/01/2019   Procedure: LIGATION ARTERIOVENOUS GORTEX GRAFT Left arm;  Surgeon: Elam Dutch, MD;  Location: [place];  Service: Vascular;  Laterality: Left;  Marland Kitchen MAZE N/A 02/18/2018   Procedure: MAZE;  Surgeon: Rexene Alberts, MD;  Location: [place];  Service: Open Heart Surgery;  Laterality: N/A;  . MITRAL VALVE REPLACEMENT N/A 02/18/2018   Procedure: MITRAL VALVE (MV) REPLACEMENT;  Surgeon: Rexene Alberts, MD;  Location: [place];  Service: Open Heart Surgery;  Laterality: N/A;  . MULTIPLE EXTRACTIONS WITH ALVEOLOPLASTY N/A 11/05/2017   Procedure: Extraction of tooth #'s 4,5,7,8,9,10,12,14 and 29 with alveoloplasty and gross debridement of remaining teeth;  Surgeon: [name], DDS;  Location: [place];  Service: Oral Surgery;  Laterality: N/A;  . PATENT FORAMEN OVALE(PFO) CLOSURE N/A 02/18/2018   Procedure: PATENT FORAMEN OVALE (PFO) CLOSURE;  Surgeon: Rexene Alberts, MD;  Location: [place];  Service: Open Heart Surgery;  Laterality: N/A;  . REMOVAL OF GRAFT Left 04/13/2019   Procedure: REMOVAL OF INFECTED GRAFT LEFT  UPPER ARM WITH VEIN PATCH OF LEFT BRACHIAL ARTERY;  Surgeon: Rosetta Posner, MD;  Location: [place];  Service: Vascular;  Laterality: Left;  . RIGHT/LEFT HEART CATH AND CORONARY ANGIOGRAPHY N/A 07/03/2017   Procedure: RIGHT/LEFT HEART CATH AND CORONARY ANGIOGRAPHY;  Surgeon: Larey Dresser, MD;  Location: [place] [facility];  Service: Cardiovascular;  Laterality: N/A;  . TEE WITHOUT CARDIOVERSION N/A 10/08/2017   Procedure: TRANSESOPHAGEAL ECHOCARDIOGRAM (TEE);  Surgeon: Larey Dresser, MD;  Location: [facility] ENDOSCOPY;  Service: Cardiovascular;  Laterality: N/A;  . TEE WITHOUT CARDIOVERSION N/A 02/18/2018   Procedure: TRANSESOPHAGEAL ECHOCARDIOGRAM (TEE);  Surgeon: Rexene Alberts, MD;  Location: [place];  Service: Open Heart Surgery;  Laterality: N/A;  . TONSILLECTOMY    . TUBAL LIGATION    . UPPER EXTREMITY VENOGRAPHY Right 06/09/2019   Procedure: UPPER EXTREMITY VENOGRAPHY;  Surgeon: Marty Heck, MD;  Location: [place] [facility];  Service: Cardiovascular;  Laterality: Right;     reports that she quit smoking  about 41 years ago. Her smoking use included cigarettes. She has never used smokeless tobacco. She reports previous drug use. She reports that she does not drink alcohol.  Allergies  Allergen Reactions  . Ace Inhibitors Anaphylaxis and Swelling  . Motrin Ib [Ibuprofen] Anaphylaxis    Family History  Problem Relation Age of Onset  . Hypertension Mother      Prior to Admission medications   Medication Sig Start Date End Date Taking? Authorizing Provider  acetaminophen (TYLENOL) 500 MG tablet Take 1,500-2,000 mg by mouth 2 (two) times daily as needed for moderate pain or headache.    Yes [provider]  albuterol (PROVENTIL HFA;VENTOLIN HFA) 108 (90 Base) MCG/ACT inhaler Inhale 2 puffs into the lungs every 6 (six) hours as needed for wheezing or shortness of breath.   Yes [provider]  ALPRAZolam (XANAX) 0.25 MG tablet Take 1 tablet (0.25 mg total) by mouth  at bedtime as needed for anxiety. 04/16/18  Yes Georgiana Shore, NP  amLODipine (NORVASC) 10 MG tablet Take 10 mg by mouth at bedtime.   Yes [provider]  aspirin EC 81 MG EC tablet Take 1 tablet (81 mg total) by mouth daily. Patient taking differently: Take 81 mg by mouth at bedtime.  04/24/17  Yes Allie Bossier, MD  atorvastatin (LIPITOR) 80 MG tablet Take 1 tablet (80 mg total) by mouth daily. Patient taking differently: Take 80 mg by mouth at bedtime.  03/06/18  Yes Barrett, Erin R, PA-C  AURYXIA 1 GM 210 MG(Fe) tablet Take 210 mg by mouth daily.  08/23/19  Yes [provider]  b complex vitamins tablet Take 1 tablet by mouth at bedtime.   Yes [provider]  busPIRone (BUSPAR) 15 MG tablet Take 1 tablet (15 mg total) by mouth 2 (two) times daily. 04/23/17  Yes Allie Bossier, MD  calcitRIOL (ROCALTROL) 0.5 MCG capsule Take 1 capsule (0.5 mcg total) by mouth every Monday, Wednesday, and Friday with hemodialysis. 04/20/19  Yes Nicole Kindred A, DO  carvedilol (COREG) 6.25 MG tablet Take 1 tablet (6.25 mg total) by mouth 2 (two) times daily. 04/20/19  Yes Nicole Kindred A, DO  cholecalciferol (VITAMIN D) 1000 units tablet Take 1,000 Units by mouth at bedtime.    Yes [provider]  dextromethorphan-guaiFENesin (MUCINEX DM) 30-600 MG 12hr tablet Take 1 tablet by mouth 2 (two) times daily as needed for cough. 04/20/19  Yes Nicole Kindred A, DO  diphenhydrAMINE (BENADRYL) 25 MG tablet Take 1 tablet (25 mg total) by mouth daily as needed for itching. 04/20/19  Yes Nicole Kindred A, DO  doxazosin (CARDURA) 4 MG tablet Take 4-8 mg by mouth See admin instructions. Take one tablet (4 mg) by mouth every morning and two tablets (8 mg) at night   Yes [provider]  gabapentin (NEURONTIN) 300 MG capsule Take 1 capsule (300 mg total) by mouth 3 (three) times a week. After dialysis on M/W/F Patient taking differently: Take 300 mg by mouth every Monday,  Wednesday, and Friday. After dialysis 04/20/19  Yes Nicole Kindred A, DO  heparin 1000 UNIT/ML injection 1 mL (1,000 Units total) by Intracatheter route every Monday, Wednesday, and Friday with hemodialysis. 04/20/19  Yes Nicole Kindred A, DO  hydrALAZINE (APRESOLINE) 100 MG tablet Take 1 tablet (100 mg total) by mouth 3 (three) times daily. 04/20/19  Yes Nicole Kindred A, DO  isosorbide mononitrate (IMDUR) 120 MG 24 hr tablet Take 1 tablet (120 mg total) by mouth  at bedtime. 04/20/19  Yes Ezekiel Slocumb, DO  levothyroxine (SYNTHROID) 100 MCG tablet Take 100 mcg by mouth daily before breakfast.   Yes [provider]  mirtazapine (REMERON) 15 MG tablet Take 15 mg by mouth at bedtime. 08/09/19  Yes [provider]  montelukast (SINGULAIR) 10 MG tablet Take 1 tablet (10 mg total) by mouth daily. Patient taking differently: Take 10 mg by mouth at bedtime.  04/24/17  Yes Allie Bossier, MD  multivitamin (RENA-VIT) TABS tablet Take 1 tablet by mouth at bedtime. 05/01/18  Yes Glenis Smoker, MD  nitroGLYCERIN (NITROSTAT) 0.4 MG SL tablet Place 0.4 mg under the tongue every 5 (five) minutes as needed for chest pain.   Yes [provider]  Nutritional Supplements (FEEDING SUPPLEMENT, NEPRO CARB STEADY,) LIQD Take 237 mLs by mouth 3 (three) times daily between meals. Patient taking differently: Take 237 mLs by mouth daily.  05/01/18  Yes Glenis Smoker, MD  pantoprazole (PROTONIX) 40 MG tablet Take 1 tablet (40 mg total) by mouth 2 (two) times daily before a meal. Patient taking differently: Take 40 mg by mouth daily before breakfast.  04/23/17  Yes Allie Bossier, MD  polyethylene glycol [facility] Floria Raveling) packet Take 17 g by mouth daily. Patient taking differently: Take 17 g by mouth daily as needed for mild constipation.  05/02/18  Yes Glenis Smoker, MD  traZODone (DESYREL) 100 MG tablet Take 100 mg by mouth at bedtime.   Yes [provider]    warfarin (COUMADIN) 3 MG tablet Take 1 tablet (3 mg total) by mouth daily at 6 PM. Patient taking differently: Take 3-6 mg by mouth See admin instructions. Take 2 tablets on Monday, Wednesday and Friday then take 1 tablet all the other days 04/20/19  Yes Nicole Kindred A, DO  oxyCODONE (ROXICODONE) 5 MG immediate release tablet Take 1 tablet (5 mg total) by mouth every 6 (six) hours as needed. Patient not taking: Reported on 08/30/2019 07/15/19   Gabriel Earing, PA-C    Physical Exam: Vitals:   09/04/19 0345 09/04/19 0425 09/04/19 0427 09/04/19 0442  BP: (!) 142/92  (!) 156/63 (!) 160/53  Pulse:   66 69  Resp: 19  16 15   Temp:   98.8 F (37.1 C) 98.2 F (36.8 C)  TempSrc:   Oral   SpO2:  98%  100%    Constitutional: NAD, calm  Eyes: PERTLA, lids and conjunctivae normal ENMT: Mucous membranes are moist. Posterior pharynx clear of any exudate or lesions.   Neck: normal, supple, no masses, no thyromegaly Respiratory: no wheezing, no crackles. No accessory muscle use.  Cardiovascular: S1 & S2 heard, normal rate. LUE edema. Abdomen: No distension, soft, no rebound pain or guarding. Bowel sounds active.  Musculoskeletal: no clubbing / cyanosis. No joint deformity upper and lower extremities.   Skin: no significant rashes, lesions, ulcers. Warm, dry, well-perfused. Neurologic: no facial asymmetry. Sensation intact. Moving all extremities.  Psychiatric: Alert and oriented, appropriate throughout interview and exam. Pleasant and cooperative.    Labs and Imaging on Admission: I have personally reviewed following labs and imaging studies  CBC: Recent Labs  Lab 09/04/19 0306  WBC 6.6  HGB 7.6*  HCT 23.2*  MCV 88.5  PLT 867   Basic Metabolic Panel: Recent Labs  Lab 09/04/19 0306  NA 140  K 4.1  CL 101  CO2 27  GLUCOSE 175*  BUN 67*  CREATININE 5.41*  CALCIUM 8.7*   GFR: Estimated  Creatinine Clearance: 8.5 mL/min (A) (by C-G formula based on SCr of 5.41 mg/dL  (H)). Liver Function Tests: Recent Labs  Lab 09/04/19 0306  AST 18  ALT 15  ALKPHOS 70  BILITOT 0.8  PROT 5.8*  ALBUMIN 3.4*   No results for input(s): LIPASE, AMYLASE in the last 168 hours. No results for input(s): AMMONIA in the last 168 hours. Coagulation Profile: Recent Labs  Lab 09/04/19 0346  INR 3.7*   Cardiac Enzymes: No results for input(s): CKTOTAL, CKMB, CKMBINDEX, TROPONINI in the last 168 hours. BNP (last 3 results) No results for input(s): PROBNP in the last 8760 hours. HbA1C: No results for input(s): HGBA1C in the last 72 hours. CBG: No results for input(s): GLUCAP in the last 168 hours. Lipid Profile: No results for input(s): CHOL, HDL, LDLCALC, TRIG, CHOLHDL, LDLDIRECT in the last 72 hours. Thyroid Function Tests: No results for input(s): TSH, T4TOTAL, FREET4, T3FREE, THYROIDAB in the last 72 hours. Anemia Panel: No results for input(s): VITAMINB12, FOLATE, FERRITIN, TIBC, IRON, RETICCTPCT in the last 72 hours. Urine analysis:    Component Value Date/Time   COLORURINE YELLOW 02/17/2018 0007   APPEARANCEUR CLEAR 02/17/2018 0007   LABSPEC 1.006 02/17/2018 0007   PHURINE 7.0 02/17/2018 0007   GLUCOSEU NEGATIVE 02/17/2018 0007   HGBUR NEGATIVE 02/17/2018 0007   BILIRUBINUR NEGATIVE 02/17/2018 0007   KETONESUR NEGATIVE 02/17/2018 0007   PROTEINUR 100 (A) 02/17/2018 0007   NITRITE NEGATIVE 02/17/2018 0007   LEUKOCYTESUR NEGATIVE 02/17/2018 0007   Sepsis Labs: @LABRCNTIP (procalcitonin:4,lacticidven:4) )No results found for this or any previous visit (from the past 240 hour(s)).   Radiological Exams on Admission: No results found.  Assessment/Plan   1. GI bleeding; anemia  - Presents with blood clots per rectum, reports chronically dark stool that she attributes to iron supplementation, and found to have hgb of 7.6, down from 11.2 in January, complicated by INR 3.7 (on warfarin for PAF) - No vomiting, abd soft, BUN is 67 in setting of ESRD though  is higher than priors  - She is receiving 1 unit RBC, 5 mg IV vit K, and FFP in ED  - GI consulted by ED physician and much appreciated  - Hold warfarin and ASA, continue IV PPI, check post-transfusion CBC and INR   2. Paroxysmal atrial fibrillation; supratherapeutic INR  - INR is 3.7 in ED and she was treated with 5 mg IV vit K and FFP    3. ESRD  - No indication for urgent HD, will likely need maintenance dialysis during this admission  - She has failed LUE access, awaiting second stage of right basilic v transposition, and currently dialyzed through tunneled right femoral catheter  - Renally-dose medications, SLIV    4. CAD  - No anginal complaints, ASA held    5. Hypertension  - SBP 140's on admission  - Treat as-needed only for now in light of acute GIB    6. Chronic combined systolic and diastolic CHF  - EF was 81-27% in 2019 with grade II diastolic dysfunction  - She appears compensated, volume management per HD    DVT prophylaxis: warfarin pta, held for acute GIB  Code Status: Full  Family Communication: Discussed with patient  Disposition Plan: Likely back home once acute GI bleeding resolved  Consults called: GI consulted by ED physician   Admission status: Observation    Vianne Bulls, MD [facility] Pager: See [email or web address]  If 7AM-7PM, please contact the daytime attending [email or web address]  09/04/2019, 5:02 AM

## 2019-09-04 NOTE — Consult Note (Signed)
Referring Provider:  Dr. Florencia Reasons Primary Care Physician:  [name], MD Primary Gastroenterologist: Althia Forts (has gastroenterologist in [place])  Reason for Consultation: Hematochezia  HPI: Jocelyn Hill is a 69 y.o. female admitted through the emergency room this morning because of a 1 day history of hematochezia, characterized by passage of clots of blood, hemoglobin 7.6 on arrival in the emergency room (as compared to baseline of 11.2, when checked 2 months earlier in January 2021).  No history of prior GI bleeding.  No syncope or presyncope.  No significant abdominal pain.  The patient's last bloody bowel movement was a couple of hours ago and was small in size.  She estimates she has had about 4 or 5 bloody bowel movements since this problem started yesterday.  The patient gets the impression that her bleeding is slowing down.  She is on Coumadin (has history of atrial fibrillation and history of bioprosthetic bovine aortic valve) and her INR was elevated at 3.7 on admission.  She is on a daily 81 mg aspirin, but also was on Protonix.   She also has end-stage renal disease and is on dialysis (for the past year or so, since her heart surgery for valve replacement)..  Therefore, interpretation of BUN level (for differentiation between upper and lower tract source of bleeding) is limited, but with BUN of 67 with creatinine of 5.4, it is not strongly suggestive of an upper tract source.  She does have a history of anorexia, nausea, dyspepsia, and postoperative weight loss--none of which sounds acute recently.  Her Protonix has only partially mitigated these symptoms.  The patient indicates she had a colonoscopy many years ago, done in [place], and actually has an appointment in the near future to have another colonoscopy there, although she does not remember the doctor's name.  A CT scan 2 years ago did not report the presence of diverticulosis.     Past Medical History:  Diagnosis  Date  . Anemia    low iron  . Anxiety   . Arthritis   . Bronchitis   . CHF (congestive heart failure) ([employer])   . Coronary artery disease   . Depression   . Diabetes mellitus without complication ([place])   . Diet-controlled diabetes mellitus ([place])   . Dyspnea    SOME   . Dysrhythmia    PAF  . ESRD (end stage renal disease) ([place]) 03/16/2017   MWF- [facility]  . Fibromyalgia   . Ganglion cyst    left wrist  . GERD (gastroesophageal reflux disease)   . Headache    chronic headaches- in the past (07/14/2019)  . Heart failure ([place])   . Heart murmur   . Hyperlipidemia   . Hypertension   . Hypothyroidism   . Mitral regurgitation   . Myocardial infarction ([place])    3x last one 2008  . PAF (paroxysmal atrial fibrillation) ([place])   . Pneumonia 2020   x 2  . S/P Maze operation for atrial fibrillation 02/18/2018   Complete bilateral atrial lesion set using bipolar radiofrequency and cryothermy ablation with clipping of LA appendage  . S/P mitral valve replacement with bioprosthetic valve 02/18/2018   [facility] Mitral stented bovine pericardial tissue valve Model 7300 TFX Serial # [id number] Size 29  . Stroke [facility])    no lasting residual - ? 2014  . Umbilical hernia     Past Surgical History:  Procedure Laterality Date  . ABDOMINAL HYSTERECTOMY     PARTIALS  . AV  FISTULA PLACEMENT Left 04/23/2018   Procedure: LEFT ARTERIOVENOUS (AV) GRAFT CREATION;  Surgeon: Marty Heck, MD;  Location: [place];  Service: Vascular;  Laterality: Left;  . AV FISTULA PLACEMENT Right 07/15/2019   Procedure: ARTERIOVENOUS (AV) FISTULA CREATION;  Surgeon: Marty Heck, MD;  Location: [place];  Service: Vascular;  Laterality: Right;  . BIV PACEMAKER INSERTION CRT-P N/A 02/26/2018   Procedure: BIV PACEMAKER INSERTION CRT-P;  Surgeon: Constance Haw, MD;  Location: [place] CV LAB;  Service: Cardiovascular;  Laterality: N/A;  . CARDIAC CATHETERIZATION    . CESAREAN SECTION    . COLONOSCOPY     . CORONARY ARTERY BYPASS GRAFT N/A 02/18/2018   Procedure: CORONARY ARTERY BYPASS GRAFTING (CABG) WITH IMA. ENDOSCOPIC VEIN HARVEST. IMA TO LAD, SVG TO CIRC.;  Surgeon: Rexene Alberts, MD;  Location: [place];  Service: Open Heart Surgery;  Laterality: N/A;  . INSERTION OF DIALYSIS CATHETER N/A 02/18/2018   Procedure: PLACEMENT OF TEMPORARY HD CATHETER, POWER TRIALYSIS 13FR 20CM;  Surgeon: Rexene Alberts, MD;  Location: [place];  Service: Vascular;  Laterality: N/A;  . INSERTION OF DIALYSIS CATHETER Right 04/23/2018   Procedure: INSERTION OF DIALYSIS CATHETER;  Surgeon: Marty Heck, MD;  Location: [place];  Service: Vascular;  Laterality: Right;  . INSERTION OF DIALYSIS CATHETER Right 03/01/2019   Procedure: INSERTION OF DIALYSIS CATHETER RIGHT Femoral;  Surgeon: Elam Dutch, MD;  Location: [place];  Service: Vascular;  Laterality: Right;  . IR NEPHRO TUBE REMOV/FL  04/02/2017  . IR NEPHROSTOGRAM RIGHT THRU EXISTING ACCESS  04/02/2017  . IR NEPHROSTOMY PLACEMENT RIGHT  03/27/2017  . IR THORACENTESIS ASP PLEURAL SPACE W/IMG GUIDE  03/24/2017  . LIGATION ARTERIOVENOUS GORTEX GRAFT Left 03/01/2019   Procedure: LIGATION ARTERIOVENOUS GORTEX GRAFT Left arm;  Surgeon: Elam Dutch, MD;  Location: [place];  Service: Vascular;  Laterality: Left;  Marland Kitchen MAZE N/A 02/18/2018   Procedure: MAZE;  Surgeon: Rexene Alberts, MD;  Location: [place];  Service: Open Heart Surgery;  Laterality: N/A;  . MITRAL VALVE REPLACEMENT N/A 02/18/2018   Procedure: MITRAL VALVE (MV) REPLACEMENT;  Surgeon: Rexene Alberts, MD;  Location: [place];  Service: Open Heart Surgery;  Laterality: N/A;  . MULTIPLE EXTRACTIONS WITH ALVEOLOPLASTY N/A 11/05/2017   Procedure: Extraction of tooth #'s 4,5,7,8,9,10,12,14 and 29 with alveoloplasty and gross debridement of remaining teeth;  Surgeon: [name], DDS;  Location: [place];  Service: Oral Surgery;  Laterality: N/A;  . PATENT FORAMEN OVALE(PFO) CLOSURE N/A 02/18/2018   Procedure:  PATENT FORAMEN OVALE (PFO) CLOSURE;  Surgeon: Rexene Alberts, MD;  Location: [place];  Service: Open Heart Surgery;  Laterality: N/A;  . REMOVAL OF GRAFT Left 04/13/2019   Procedure: REMOVAL OF INFECTED GRAFT LEFT UPPER ARM WITH VEIN PATCH OF LEFT BRACHIAL ARTERY;  Surgeon: Rosetta Posner, MD;  Location: [place];  Service: Vascular;  Laterality: Left;  . RIGHT/LEFT HEART CATH AND CORONARY ANGIOGRAPHY N/A 07/03/2017   Procedure: RIGHT/LEFT HEART CATH AND CORONARY ANGIOGRAPHY;  Surgeon: Larey Dresser, MD;  Location: [place] CV [facility];  Service: Cardiovascular;  Laterality: N/A;  . TEE WITHOUT CARDIOVERSION N/A 10/08/2017   Procedure: TRANSESOPHAGEAL ECHOCARDIOGRAM (TEE);  Surgeon: Larey Dresser, MD;  Location: [facility];  Service: Cardiovascular;  Laterality: N/A;  . TEE WITHOUT CARDIOVERSION N/A 02/18/2018   Procedure: TRANSESOPHAGEAL ECHOCARDIOGRAM (TEE);  Surgeon: Rexene Alberts, MD;  Location: [place];  Service: Open Heart Surgery;  Laterality: N/A;  .  TONSILLECTOMY    . TUBAL LIGATION    . UPPER EXTREMITY VENOGRAPHY Right 06/09/2019   Procedure: UPPER EXTREMITY VENOGRAPHY;  Surgeon: Marty Heck, MD;  Location: [place] [facility];  Service: Cardiovascular;  Laterality: Right;    Prior to Admission medications   Medication Sig Start Date End Date Taking? Authorizing Provider  acetaminophen (TYLENOL) 500 MG tablet Take 1,500-2,000 mg by mouth 2 (two) times daily as needed for moderate pain or headache.    Yes [provider]  albuterol (PROVENTIL HFA;VENTOLIN HFA) 108 (90 Base) MCG/ACT inhaler Inhale 2 puffs into the lungs every 6 (six) hours as needed for wheezing or shortness of breath.   Yes [provider]  ALPRAZolam (XANAX) 0.25 MG tablet Take 1 tablet (0.25 mg total) by mouth at bedtime as needed for anxiety. 04/16/18  Yes Georgiana Shore, NP  amLODipine (NORVASC) 10 MG tablet Take 10 mg by mouth at bedtime.   Yes [provider]  aspirin EC 81 MG EC  tablet Take 1 tablet (81 mg total) by mouth daily. Patient taking differently: Take 81 mg by mouth at bedtime.  04/24/17  Yes Allie Bossier, MD  atorvastatin (LIPITOR) 80 MG tablet Take 1 tablet (80 mg total) by mouth daily. Patient taking differently: Take 80 mg by mouth at bedtime.  03/06/18  Yes Barrett, Erin R, PA-C  AURYXIA 1 GM 210 MG(Fe) tablet Take 210 mg by mouth daily.  08/23/19  Yes [provider]  b complex vitamins tablet Take 1 tablet by mouth at bedtime.   Yes [provider]  busPIRone (BUSPAR) 15 MG tablet Take 1 tablet (15 mg total) by mouth 2 (two) times daily. 04/23/17  Yes Allie Bossier, MD  calcitRIOL (ROCALTROL) 0.5 MCG capsule Take 1 capsule (0.5 mcg total) by mouth every Monday, Wednesday, and Friday with hemodialysis. 04/20/19  Yes Nicole Kindred A, DO  carvedilol (COREG) 6.25 MG tablet Take 1 tablet (6.25 mg total) by mouth 2 (two) times daily. 04/20/19  Yes Nicole Kindred A, DO  cholecalciferol (VITAMIN D) 1000 units tablet Take 1,000 Units by mouth at bedtime.    Yes [provider]  dextromethorphan-guaiFENesin (MUCINEX DM) 30-600 MG 12hr tablet Take 1 tablet by mouth 2 (two) times daily as needed for cough. 04/20/19  Yes Nicole Kindred A, DO  diphenhydrAMINE (BENADRYL) 25 MG tablet Take 1 tablet (25 mg total) by mouth daily as needed for itching. 04/20/19  Yes Nicole Kindred A, DO  doxazosin (CARDURA) 4 MG tablet Take 4-8 mg by mouth See admin instructions. Take one tablet (4 mg) by mouth every morning and two tablets (8 mg) at night   Yes [provider]  gabapentin (NEURONTIN) 300 MG capsule Take 1 capsule (300 mg total) by mouth 3 (three) times a week. After dialysis on M/W/F Patient taking differently: Take 300 mg by mouth every Monday, Wednesday, and Friday. After dialysis 04/20/19  Yes Nicole Kindred A, DO  heparin 1000 UNIT/ML injection 1 mL (1,000 Units total) by Intracatheter route every Monday, Wednesday, and Friday  with hemodialysis. 04/20/19  Yes Nicole Kindred A, DO  hydrALAZINE (APRESOLINE) 100 MG tablet Take 1 tablet (100 mg total) by mouth 3 (three) times daily. 04/20/19  Yes Nicole Kindred A, DO  isosorbide mononitrate (IMDUR) 120 MG 24 hr tablet Take 1 tablet (120 mg total) by mouth at bedtime. 04/20/19  Yes Ezekiel Slocumb, DO  levothyroxine (SYNTHROID) 100 MCG tablet Take 100 mcg by mouth daily before breakfast.  Yes [provider]  mirtazapine (REMERON) 15 MG tablet Take 15 mg by mouth at bedtime. 08/09/19  Yes [provider]  montelukast (SINGULAIR) 10 MG tablet Take 1 tablet (10 mg total) by mouth daily. Patient taking differently: Take 10 mg by mouth at bedtime.  04/24/17  Yes Allie Bossier, MD  multivitamin (RENA-VIT) TABS tablet Take 1 tablet by mouth at bedtime. 05/01/18  Yes Glenis Smoker, MD  nitroGLYCERIN (NITROSTAT) 0.4 MG SL tablet Place 0.4 mg under the tongue every 5 (five) minutes as needed for chest pain.   Yes [provider]  Nutritional Supplements (FEEDING SUPPLEMENT, NEPRO CARB STEADY,) LIQD Take 237 mLs by mouth 3 (three) times daily between meals. Patient taking differently: Take 237 mLs by mouth daily.  05/01/18  Yes Glenis Smoker, MD  pantoprazole (PROTONIX) 40 MG tablet Take 1 tablet (40 mg total) by mouth 2 (two) times daily before a meal. Patient taking differently: Take 40 mg by mouth daily before breakfast.  04/23/17  Yes Allie Bossier, MD  polyethylene glycol [facility] Floria Raveling) packet Take 17 g by mouth daily. Patient taking differently: Take 17 g by mouth daily as needed for mild constipation.  05/02/18  Yes Glenis Smoker, MD  traZODone (DESYREL) 100 MG tablet Take 100 mg by mouth at bedtime.   Yes [provider]  warfarin (COUMADIN) 3 MG tablet Take 1 tablet (3 mg total) by mouth daily at 6 PM. Patient taking differently: Take 3-6 mg by mouth See admin instructions. Take 2 tablets on Monday, Wednesday  and Friday then take 1 tablet all the other days 04/20/19  Yes Nicole Kindred A, DO  oxyCODONE (ROXICODONE) 5 MG immediate release tablet Take 1 tablet (5 mg total) by mouth every 6 (six) hours as needed. Patient not taking: Reported on 08/30/2019 07/15/19   Gabriel Earing, PA-C    Current Facility-Administered Medications  Medication Dose Route Frequency Provider Last Rate Last Admin  . 0.9 %  sodium chloride infusion  250 mL Intravenous PRN Opyd, Ilene Qua, MD      . fentaNYL (SUBLIMAZE) injection 25-50 mcg  25-50 mcg Intravenous Q3H PRN Vianne Bulls, MD   25 mcg at 09/04/19 2202  . ondansetron (ZOFRAN) tablet 4 mg  4 mg Oral Q6H PRN Opyd, Ilene Qua, MD       Or  . ondansetron (ZOFRAN) injection 4 mg  4 mg Intravenous Q6H PRN Opyd, Ilene Qua, MD      . pantoprazole (PROTONIX) 80 mg in sodium chloride 0.9 % 100 mL (0.8 mg/mL) infusion  8 mg/hr Intravenous Continuous Merryl Hacker, MD 10 mL/hr at 09/04/19 0812 8 mg/hr at 09/04/19 0812  . pantoprazole (PROTONIX) 80 mg in sodium chloride 0.9 % 100 mL IVPB  80 mg Intravenous Once Merryl Hacker, MD      . Derrill Memo ON 09/07/2019] pantoprazole (PROTONIX) injection 40 mg  40 mg Intravenous Q12H Horton, Barbette Hair, MD      . Derrill Memo ON 09/05/2019] pneumococcal 23 valent vaccine (PNEUMOVAX-23) injection 0.5 mL  0.5 mL Intramuscular Tomorrow-1000 Opyd, Timothy S, MD      . sodium chloride flush (NS) 0.9 % injection 3 mL  3 mL Intravenous Q12H Opyd, Ilene Qua, MD   3 mL at 09/04/19 1040  . sodium chloride flush (NS) 0.9 % injection 3 mL  3 mL Intravenous Q12H Opyd, Ilene Qua, MD   3 mL at 09/04/19 1040  . sodium chloride flush (NS) 0.9 %  injection 3 mL  3 mL Intravenous PRN Opyd, Ilene Qua, MD        Allergies as of 09/04/2019 - Review Complete 09/04/2019  Allergen Reaction Noted  . Ace inhibitors Anaphylaxis and Swelling 03/12/2017  . Motrin ib [ibuprofen] Anaphylaxis 10/08/2017    Family History  Problem Relation Age of Onset  .  Hypertension Mother     Social History   Socioeconomic History  . Marital status: Divorced    Spouse name: Not on file  . Number of children: Not on file  . Years of education: Not on file  . Highest education level: Not on file  Occupational History  . Not on file  Tobacco Use  . Smoking status: Former Smoker    Types: Cigarettes    Quit date: 1980    Years since quitting: 41.2  . Smokeless tobacco: Never Used  . Tobacco comment: short time  Substance and Sexual Activity  . Alcohol use: No  . Drug use: Not Currently    Comment: marijuana in the past  . Sexual activity: Not Currently  Other Topics Concern  . Not on file  Social History Narrative  . Not on file   Social Determinants of Health   Financial Resource Strain:   . Difficulty of Paying Living Expenses: Not on file  Food Insecurity:   . Worried About Charity fundraiser in the Last Year: Not on file  . Ran Out of Food in the Last Year: Not on file  Transportation Needs:   . Lack of Transportation (Medical): Not on file  . Lack of Transportation (Non-Medical): Not on file  Physical Activity:   . Days of Exercise per Week: Not on file  . Minutes of Exercise per Session: Not on file  Stress:   . Feeling of Stress : Not on file  Social Connections:   . Frequency of Communication with Friends and Family: Not on file  . Frequency of Social Gatherings with Friends and Family: Not on file  . Attends Religious Services: Not on file  . Active Member of Clubs or Organizations: Not on file  . Attends Archivist Meetings: Not on file  . Marital Status: Not on file  Intimate Partner Violence:   . Fear of Current or Ex-Partner: Not on file  . Emotionally Abused: Not on file  . Physically Abused: Not on file  . Sexually Abused: Not on file    Review of Systems: No chest pain or dyspnea, no joint swelling or obvious lymphadenopathy, no focal neurologic symptoms (has been jittery in recent days, however).   GI symptoms per HPI  Physical Exam: Vital signs in last 24 hours: Temp:  [97.7 F (36.5 C)-98.8 F (37.1 C)] 98 F (36.7 C) (03/07 0820) Pulse Rate:  [66-82] 82 (03/07 0820) Resp:  [15-19] 19 (03/07 0820) BP: (116-184)/(53-92) 184/83 (03/07 0820) SpO2:  [94 %-100 %] 94 % (03/07 0820) Weight:  [56.7 kg] 56.7 kg (03/07 0554) Last BM Date: 09/04/19 General:   Alert, thin,, pleasant and cooperative in NAD.  Certainly does not look that least bit shocky. Head:  Normocephalic and atraumatic. Eyes:  Sclera clear, no icterus.   Lungs: Rhonchi at left base.  No crackles.  No evident respiratory distress. Heart:   Regular rate and rhythm; no murmurs, clicks, rubs,  or gallops. Abdomen:  Soft, nontender, nontympanitic, and nondistended. No masses, hepatosplenomegaly or ventral hernias noted. Normal bowel sounds, without bruits, guarding, or rebound.   Rectal: Dark burgundy  blood present, no evident stool, no melena. Msk:   Symmetrical without gross deformities. Pulses:  Normal, full radial pulse is noted. Extremities:   Without edema. Neurologic:  Alert and coherent;  grossly normal neurologically. Skin:  Intact without significant lesions or rashes. Psych:   Alert and cooperative. Normal mood and affect.  Intake/Output from previous day: 03/06 0701 - 03/07 0700 In: 353.5 [I.V.:13.5; Blood:340] Out: -  Intake/Output this shift: Total I/O In: 381.3 [Blood:381.3] Out: -   Lab Results: Recent Labs    09/04/19 0306  WBC 6.6  HGB 7.6*  HCT 23.2*  PLT 151   BMET Recent Labs    09/04/19 0306  NA 140  K 4.1  CL 101  CO2 27  GLUCOSE 175*  BUN 67*  CREATININE 5.41*  CALCIUM 8.7*   LFT Recent Labs    09/04/19 0306  PROT 5.8*  ALBUMIN 3.4*  AST 18  ALT 15  ALKPHOS 70  BILITOT 0.8   PT/INR Recent Labs    09/04/19 0346  LABPROT 36.4*  INR 3.7*    Studies/Results: No results found.  Impression: Overall picture is most consistent with lower tract source of  bleeding, most likely diverticular based on patient's age.  Plan: 1.  Agree with empiric coverage with PPI in case this is an aspirin induced upper tract source of bleeding (doubt)  2.  Have recommended reversal of Coumadin because of the severity of the patient's bleeding and because the most likely source (diverticular) often causes on and off bleeding for days at a time.  3.  If the patient shows signs of brisk, active bleeding, would favor bleeding scan to try to localize source and possibly offer the opportunity for IR treatment.  4.  In the meantime, I will give MiraLAX to gently cleanse the bowel and help purge out old blood.  This may help [place] know sooner whether or not she is having ongoing bleeding.  5.  Would recommend cardiology consultation for guidance regarding resumption of anticoagulation.  6.  Tentatively, depending on the patient's clinical course, I would anticipate prepping her tomorrow and doing endoscopy and colonoscopy on Tuesday.   LOS: 0 days   Youlanda Mighty Ebelin Dillehay  09/04/2019, 11:05 AM   Pager [phone] If no answer or after 5 PM call [phone]

## 2019-09-04 NOTE — ED Triage Notes (Signed)
Pt arrives via [facility] from home, per their report, the pt has had blood clots in her stool since yesterday, 90% RA, placed on 2 liters. 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 128/62, 98% 1L, P 67.

## 2019-09-04 NOTE — Consult Note (Addendum)
[facility] Renal Consultation Note    Indication for Consultation:  Management of ESRD/hemodialysis, anemia, hypertension/volume, and secondary hyperparathyroidism.  HPI: Jocelyn Hill is a 68 y.o. female with PMH including ESRD on dialysis, PAF on warfarin, HTN, CHF and CAD who presented to the ED this AM with GI bleed. Reports passing clots over the past 24 hours with abdominal discomfort. She is on warfarin and denies NSAID use. Hemoglobin on presentation was 7.6. 1 unit PRBC ordered and 5 mg vit K ordred per primary team. INR was 3.7. GI was consulted for further management.  Patient dialyzes in [place] on MWF. Her last HD was 08/31/19. She missed her last treatment because she was not feeling well. On presentation to the ED, K 4.1, BUN 67, Cr 5.41, Ca 8.7, Alb 3.4. Vital signs notable for hypertension, O2 sat 94-98% on RA. She currently dialyzes through R femoral catheter and has a maturing AVF, awaiting second stage R basilic transposition.   Patient reports a cough for about 1 week. Reports she had a CXR on 3/4 that was unremarkable No fevers and SARS Cov 2 negative. Denies SOB, orthopnea, CP, palpitations, nausea, vomiting and edema. Does report worsening anxiety and feeling shaky for the past week.   Past Medical History:  Diagnosis Date  . Anemia    low iron  . Anxiety   . Arthritis   . Bronchitis   . CHF (congestive heart failure) ([place])   . Coronary artery disease   . Depression   . Diabetes mellitus without complication ([place])   . Diet-controlled diabetes mellitus ([place])   . Dyspnea    SOME   . Dysrhythmia    PAF  . ESRD (end stage renal disease) ([place]) 03/16/2017   MWF- [facility]  . Fibromyalgia   . Ganglion cyst    left wrist  . GERD (gastroesophageal reflux disease)   . Headache    chronic headaches- in the past (07/14/2019)  . Heart failure ([place])   . Heart murmur   . Hyperlipidemia   . Hypertension   . Hypothyroidism   . Mitral regurgitation   .  Myocardial infarction ([place])    3x last one 2008  . PAF (paroxysmal atrial fibrillation) ([place])   . Pneumonia 2020   x 2  . S/P Maze operation for atrial fibrillation 02/18/2018   Complete bilateral atrial lesion set using bipolar radiofrequency and cryothermy ablation with clipping of LA appendage  . S/P mitral valve replacement with bioprosthetic valve 02/18/2018   [facility] Mitral stented bovine pericardial tissue valve Model 7300 TFX Serial # [id number] Size 29  . Stroke [facility])    no lasting residual - ? 2014  . Umbilical hernia    Past Surgical History:  Procedure Laterality Date  . ABDOMINAL HYSTERECTOMY     PARTIALS  . AV FISTULA PLACEMENT Left 04/23/2018   Procedure: LEFT ARTERIOVENOUS (AV) GRAFT CREATION;  Surgeon: Marty Heck, MD;  Location: [place];  Service: Vascular;  Laterality: Left;  . AV FISTULA PLACEMENT Right 07/15/2019   Procedure: ARTERIOVENOUS (AV) FISTULA CREATION;  Surgeon: Marty Heck, MD;  Location: [place];  Service: Vascular;  Laterality: Right;  . BIV PACEMAKER INSERTION CRT-P N/A 02/26/2018   Procedure: BIV PACEMAKER INSERTION CRT-P;  Surgeon: Constance Haw, MD;  Location: [place] CV LAB;  Service: Cardiovascular;  Laterality: N/A;  . CARDIAC CATHETERIZATION    . CESAREAN SECTION    . COLONOSCOPY    . CORONARY ARTERY BYPASS GRAFT N/A  02/18/2018   Procedure: CORONARY ARTERY BYPASS GRAFTING (CABG) WITH IMA. ENDOSCOPIC VEIN HARVEST. IMA TO LAD, SVG TO CIRC.;  Surgeon: Rexene Alberts, MD;  Location: [place];  Service: Open Heart Surgery;  Laterality: N/A;  . INSERTION OF DIALYSIS CATHETER N/A 02/18/2018   Procedure: PLACEMENT OF TEMPORARY HD CATHETER, POWER TRIALYSIS 13FR 20CM;  Surgeon: Rexene Alberts, MD;  Location: [place];  Service: Vascular;  Laterality: N/A;  . INSERTION OF DIALYSIS CATHETER Right 04/23/2018   Procedure: INSERTION OF DIALYSIS CATHETER;  Surgeon: Marty Heck, MD;  Location: [place];  Service: Vascular;  Laterality:  Right;  . INSERTION OF DIALYSIS CATHETER Right 03/01/2019   Procedure: INSERTION OF DIALYSIS CATHETER RIGHT Femoral;  Surgeon: Elam Dutch, MD;  Location: [place];  Service: Vascular;  Laterality: Right;  . IR NEPHRO TUBE REMOV/FL  04/02/2017  . IR NEPHROSTOGRAM RIGHT THRU EXISTING ACCESS  04/02/2017  . IR NEPHROSTOMY PLACEMENT RIGHT  03/27/2017  . IR THORACENTESIS ASP PLEURAL SPACE W/IMG GUIDE  03/24/2017  . LIGATION ARTERIOVENOUS GORTEX GRAFT Left 03/01/2019   Procedure: LIGATION ARTERIOVENOUS GORTEX GRAFT Left arm;  Surgeon: Elam Dutch, MD;  Location: [place];  Service: Vascular;  Laterality: Left;  Marland Kitchen MAZE N/A 02/18/2018   Procedure: MAZE;  Surgeon: Rexene Alberts, MD;  Location: [place];  Service: Open Heart Surgery;  Laterality: N/A;  . MITRAL VALVE REPLACEMENT N/A 02/18/2018   Procedure: MITRAL VALVE (MV) REPLACEMENT;  Surgeon: Rexene Alberts, MD;  Location: [place];  Service: Open Heart Surgery;  Laterality: N/A;  . MULTIPLE EXTRACTIONS WITH ALVEOLOPLASTY N/A 11/05/2017   Procedure: Extraction of tooth #'s 4,5,7,8,9,10,12,14 and 29 with alveoloplasty and gross debridement of remaining teeth;  Surgeon: [name], DDS;  Location: [place];  Service: Oral Surgery;  Laterality: N/A;  . PATENT FORAMEN OVALE(PFO) CLOSURE N/A 02/18/2018   Procedure: PATENT FORAMEN OVALE (PFO) CLOSURE;  Surgeon: Rexene Alberts, MD;  Location: [place];  Service: Open Heart Surgery;  Laterality: N/A;  . REMOVAL OF GRAFT Left 04/13/2019   Procedure: REMOVAL OF INFECTED GRAFT LEFT UPPER ARM WITH VEIN PATCH OF LEFT BRACHIAL ARTERY;  Surgeon: Rosetta Posner, MD;  Location: [place];  Service: Vascular;  Laterality: Left;  . RIGHT/LEFT HEART CATH AND CORONARY ANGIOGRAPHY N/A 07/03/2017   Procedure: RIGHT/LEFT HEART CATH AND CORONARY ANGIOGRAPHY;  Surgeon: Larey Dresser, MD;  Location: [place] CV LAB;  Service: Cardiovascular;  Laterality: N/A;  . TEE WITHOUT CARDIOVERSION N/A 10/08/2017   Procedure: TRANSESOPHAGEAL  ECHOCARDIOGRAM (TEE);  Surgeon: Larey Dresser, MD;  Location[facility] ENDOSCOPY;  Service: Cardiovascular;  Laterality: N/A;  . TEE WITHOUT CARDIOVERSION N/A 02/18/2018   Procedure: TRANSESOPHAGEAL ECHOCARDIOGRAM (TEE);  Surgeon: Rexene Alberts, MD;  Location: [place];  Service: Open Heart Surgery;  Laterality: N/A;  . TONSILLECTOMY    . TUBAL LIGATION    . UPPER EXTREMITY VENOGRAPHY Right 06/09/2019   Procedure: UPPER EXTREMITY VENOGRAPHY;  Surgeon: Marty Heck, MD;  Location: [place] CV LAB;  Service: Cardiovascular;  Laterality: Right;   Family History  Problem Relation Age of Onset  . Hypertension Mother    Social History:  reports that she quit smoking about 41 years ago. Her smoking use included cigarettes. She has never used smokeless tobacco. She reports previous drug use. She reports that she does not drink alcohol.  ROS: As per HPI otherwise negative.  Physical Exam: Vitals:   09/04/19 6803 09/04/19 2122 09/04/19 4825 09/04/19 0820  BP: (!) 156/63 (!) 160/53 (!) 161/72 (!) 184/83  Pulse: 66 69 77 82  Resp: 16 15 16 19   Temp: 98.8 F (37.1 C) 98.2 F (36.8 C) 97.7 F (36.5 C) 98 F (36.7 C)  TempSrc: Oral  Oral Axillary  SpO2:  100% 98% 94%  Weight:   56.7 kg   Height:   5\' 9"  (1.753 m)      General: Well developed, well nourished, in no acute distress. Head: Normocephalic, atraumatic, sclera non-icteric, mucus membranes are moist. Neck: Supple without lymphadenopathy/masses. JVD not elevated. Lungs: Respirations unlabored on RA. + expiratory wheeze bilateral lower lobes Heart: RRR with normal S1, S2. No murmurs, rubs, or gallops appreciated. Abdomen: Soft, non-tender, non-distended with normoactive bowel sounds. No rebound/guarding. No obvious abdominal masses. Musculoskeletal:  Strength and tone appear normal for age. Lower extremities: No edema or ischemic changes, no open wounds. Neuro: Alert and oriented X 3. Moves all extremities  spontaneously. Psych:  Responds to questions appropriately with a normal affect. Dialysis Access: R thigh TDC without surrounding erythema/drainage. Maturing RUE AVF + bruit  Allergies  Allergen Reactions  . Ace Inhibitors Anaphylaxis and Swelling  . Motrin Ib [Ibuprofen] Anaphylaxis   Prior to Admission medications   Medication Sig Start Date End Date Taking? Authorizing Provider  acetaminophen (TYLENOL) 500 MG tablet Take 1,500-2,000 mg by mouth 2 (two) times daily as needed for moderate pain or headache.    Yes [provider]  albuterol (PROVENTIL HFA;VENTOLIN HFA) 108 (90 Base) MCG/ACT inhaler Inhale 2 puffs into the lungs every 6 (six) hours as needed for wheezing or shortness of breath.   Yes [provider]  ALPRAZolam (XANAX) 0.25 MG tablet Take 1 tablet (0.25 mg total) by mouth at bedtime as needed for anxiety. 04/16/18  Yes Georgiana Shore, NP  amLODipine (NORVASC) 10 MG tablet Take 10 mg by mouth at bedtime.   Yes [provider]  aspirin EC 81 MG EC tablet Take 1 tablet (81 mg total) by mouth daily. Patient taking differently: Take 81 mg by mouth at bedtime.  04/24/17  Yes Allie Bossier, MD  atorvastatin (LIPITOR) 80 MG tablet Take 1 tablet (80 mg total) by mouth daily. Patient taking differently: Take 80 mg by mouth at bedtime.  03/06/18  Yes Barrett, Erin R, PA-C  AURYXIA 1 GM 210 MG(Fe) tablet Take 210 mg by mouth daily.  08/23/19  Yes [provider]  b complex vitamins tablet Take 1 tablet by mouth at bedtime.   Yes [provider]  busPIRone (BUSPAR) 15 MG tablet Take 1 tablet (15 mg total) by mouth 2 (two) times daily. 04/23/17  Yes Allie Bossier, MD  calcitRIOL (ROCALTROL) 0.5 MCG capsule Take 1 capsule (0.5 mcg total) by mouth every Monday, Wednesday, and Friday with hemodialysis. 04/20/19  Yes Nicole Kindred A, DO  carvedilol (COREG) 6.25 MG tablet Take 1 tablet (6.25 mg total) by mouth 2 (two) times daily. 04/20/19  Yes  Nicole Kindred A, DO  cholecalciferol (VITAMIN D) 1000 units tablet Take 1,000 Units by mouth at bedtime.    Yes [provider]  dextromethorphan-guaiFENesin (MUCINEX DM) 30-600 MG 12hr tablet Take 1 tablet by mouth 2 (two) times daily as needed for cough. 04/20/19  Yes Nicole Kindred A, DO  diphenhydrAMINE (BENADRYL) 25 MG tablet Take 1 tablet (25 mg total) by mouth daily as needed for itching. 04/20/19  Yes Nicole Kindred A, DO  doxazosin (CARDURA) 4 MG tablet Take 4-8 mg by mouth  See admin instructions. Take one tablet (4 mg) by mouth every morning and two tablets (8 mg) at night   Yes [provider]  gabapentin (NEURONTIN) 300 MG capsule Take 1 capsule (300 mg total) by mouth 3 (three) times a week. After dialysis on M/W/F Patient taking differently: Take 300 mg by mouth every Monday, Wednesday, and Friday. After dialysis 04/20/19  Yes Nicole Kindred A, DO  heparin 1000 UNIT/ML injection 1 mL (1,000 Units total) by Intracatheter route every Monday, Wednesday, and Friday with hemodialysis. 04/20/19  Yes Nicole Kindred A, DO  hydrALAZINE (APRESOLINE) 100 MG tablet Take 1 tablet (100 mg total) by mouth 3 (three) times daily. 04/20/19  Yes Nicole Kindred A, DO  isosorbide mononitrate (IMDUR) 120 MG 24 hr tablet Take 1 tablet (120 mg total) by mouth at bedtime. 04/20/19  Yes Ezekiel Slocumb, DO  levothyroxine (SYNTHROID) 100 MCG tablet Take 100 mcg by mouth daily before breakfast.   Yes [provider]  mirtazapine (REMERON) 15 MG tablet Take 15 mg by mouth at bedtime. 08/09/19  Yes [provider]  montelukast (SINGULAIR) 10 MG tablet Take 1 tablet (10 mg total) by mouth daily. Patient taking differently: Take 10 mg by mouth at bedtime.  04/24/17  Yes Allie Bossier, MD  multivitamin (RENA-VIT) TABS tablet Take 1 tablet by mouth at bedtime. 05/01/18  Yes Glenis Smoker, MD  nitroGLYCERIN (NITROSTAT) 0.4 MG SL tablet Place 0.4 mg under the tongue every  5 (five) minutes as needed for chest pain.   Yes [provider]  Nutritional Supplements (FEEDING SUPPLEMENT, NEPRO CARB STEADY,) LIQD Take 237 mLs by mouth 3 (three) times daily between meals. Patient taking differently: Take 237 mLs by mouth daily.  05/01/18  Yes Glenis Smoker, MD  pantoprazole (PROTONIX) 40 MG tablet Take 1 tablet (40 mg total) by mouth 2 (two) times daily before a meal. Patient taking differently: Take 40 mg by mouth daily before breakfast.  04/23/17  Yes Allie Bossier, MD  polyethylene glycol [facility] / Floria Raveling) packet Take 17 g by mouth daily. Patient taking differently: Take 17 g by mouth daily as needed for mild constipation.  05/02/18  Yes Glenis Smoker, MD  traZODone (DESYREL) 100 MG tablet Take 100 mg by mouth at bedtime.   Yes [provider]  warfarin (COUMADIN) 3 MG tablet Take 1 tablet (3 mg total) by mouth daily at 6 PM. Patient taking differently: Take 3-6 mg by mouth See admin instructions. Take 2 tablets on Monday, Wednesday and Friday then take 1 tablet all the other days 04/20/19  Yes Nicole Kindred A, DO  oxyCODONE (ROXICODONE) 5 MG immediate release tablet Take 1 tablet (5 mg total) by mouth every 6 (six) hours as needed. Patient not taking: Reported on 08/30/2019 07/15/19   Gabriel Earing, PA-C   Current Facility-Administered Medications  Medication Dose Route Frequency Provider Last Rate Last Admin  . 0.9 %  sodium chloride infusion (Manually program via Guardrails IV Fluids)   Intravenous Once Florencia Reasons, MD      . 0.9 %  sodium chloride infusion  250 mL Intravenous PRN Opyd, Ilene Qua, MD      . fentaNYL (SUBLIMAZE) injection 25-50 mcg  25-50 mcg Intravenous Q3H PRN Vianne Bulls, MD   25 mcg at 09/04/19 5397  . ondansetron (ZOFRAN) tablet 4 mg  4 mg Oral Q6H PRN Opyd, Ilene Qua, MD       Or  . ondansetron (ZOFRAN) injection  4 mg  4 mg Intravenous Q6H PRN Opyd, Ilene Qua, MD      . pantoprazole (PROTONIX) 80 mg in  sodium chloride 0.9 % 100 mL (0.8 mg/mL) infusion  8 mg/hr Intravenous Continuous Dina Rich Barbette Hair, MD 10 mL/hr at 09/04/19 0812 8 mg/hr at 09/04/19 0812  . pantoprazole (PROTONIX) 80 mg in sodium chloride 0.9 % 100 mL IVPB  80 mg Intravenous Once Merryl Hacker, MD      . Derrill Memo ON 09/07/2019] pantoprazole (PROTONIX) injection 40 mg  40 mg Intravenous Q12H Horton, Barbette Hair, MD      . Derrill Memo ON 09/05/2019] pneumococcal 23 valent vaccine (PNEUMOVAX-23) injection 0.5 mL  0.5 mL Intramuscular Tomorrow-1000 Opyd, Ilene Qua, MD      . polyethylene glycol (MIRALAX / GLYCOLAX) packet 17 g  17 g Oral BID Buccini, Robert, MD      . sodium chloride flush (NS) 0.9 % injection 3 mL  3 mL Intravenous Q12H Opyd, Ilene Qua, MD   3 mL at 09/04/19 1040  . sodium chloride flush (NS) 0.9 % injection 3 mL  3 mL Intravenous Q12H Opyd, Ilene Qua, MD   3 mL at 09/04/19 1040  . sodium chloride flush (NS) 0.9 % injection 3 mL  3 mL Intravenous PRN Opyd, Ilene Qua, MD       Labs: Basic Metabolic Panel: Recent Labs  Lab 09/04/19 0306  NA 140  K 4.1  CL 101  CO2 27  GLUCOSE 175*  BUN 67*  CREATININE 5.41*  CALCIUM 8.7*   Liver Function Tests: Recent Labs  Lab 09/04/19 0306  AST 18  ALT 15  ALKPHOS 70  BILITOT 0.8  PROT 5.8*  ALBUMIN 3.4*   CBC: Recent Labs  Lab 09/04/19 0306 09/04/19 1052  WBC 6.6  --   HGB 7.6* 8.8*  HCT 23.2* 25.9*  MCV 88.5  --   PLT 151  --     Dialysis Orders:  Center: [facility] on MWF . MonWedFri, 3 hrs 30 min, 180NRe Optiflux, BFR 400, DFR Manual 800 mL/min, EDW 53 (kg), Dialysate 2.0 K, 2.25 Ca, UFR Profile: Profile 2, Sodium Model: None,  R thigh TDC Venofer 100 mg IV q HD x 5 doses- received 2/5, last tsat 28% Mircera 50 mcg IV q 2 weeks- last dose 2/24 Calcitriol 0.5 mcg PO TIW  Assessment/Plan: 1.  Acute GI bleed: Hgb 7.6, INR elevated on presentation. Received 1 unit PRBC on 3/7. GI consulted and recommended PPI, reversal of coumadin, miralax  and possible bleeding scan.  2.  ESRD:  MWF schedule, missed last HD. No emergent indication for HD today. Will plan HD tomorrow per regular MWF schedule. 3.  Hypertension/volume: BP elevated, 3.7kg over EDW and wheezing possibly due to increased fluid from missed HD. Currently comfortable and O2 sat stable on RA. UFG 3L with HD tomorrow as tolerated. 153mL Fluid restriction.  4.  Anemia: Hgb 7.6, see # 1. Not due for ESA yet. 5.  Metabolic bone disease: Corrected calcium at goal, phos pending. Patient is on clear liquid diet, hold binders for now. 6.  Nutrition:  Clear liquid diet, recommend renal diet with fluid restrictions once advanced.  7. Paroxysmal a. Fib: INR elevated on admission, per primary team 8. CHF: EF 30-35%, volume management with HD as above.   Anice Paganini, PA-C 09/04/2019, 11:39 AM  [facility] Pager: [phone]  I have seen and examined this patient and agree with the plan of care. Pt  will be dialyzed tomorrow; no acute indication today. Transfuse as needed and GI seeing pt as well. Will dialyze tomorrow without heparin and will try to get her to EDW.  Dwana Melena, MD 09/04/2019, 1:30 PM

## 2019-09-05 DIAGNOSIS — K5731 Diverticulosis of large intestine without perforation or abscess with bleeding: Secondary | ICD-10-CM | POA: Diagnosis present

## 2019-09-05 DIAGNOSIS — R011 Cardiac murmur, unspecified: Secondary | ICD-10-CM | POA: Diagnosis present

## 2019-09-05 DIAGNOSIS — K625 Hemorrhage of anus and rectum: Secondary | ICD-10-CM | POA: Diagnosis present

## 2019-09-05 DIAGNOSIS — F419 Anxiety disorder, unspecified: Secondary | ICD-10-CM | POA: Diagnosis present

## 2019-09-05 DIAGNOSIS — K2970 Gastritis, unspecified, without bleeding: Secondary | ICD-10-CM | POA: Diagnosis present

## 2019-09-05 DIAGNOSIS — K219 Gastro-esophageal reflux disease without esophagitis: Secondary | ICD-10-CM | POA: Diagnosis present

## 2019-09-05 DIAGNOSIS — D62 Acute posthemorrhagic anemia: Secondary | ICD-10-CM | POA: Diagnosis present

## 2019-09-05 DIAGNOSIS — N2581 Secondary hyperparathyroidism of renal origin: Secondary | ICD-10-CM | POA: Diagnosis present

## 2019-09-05 DIAGNOSIS — I5042 Chronic combined systolic (congestive) and diastolic (congestive) heart failure: Secondary | ICD-10-CM | POA: Diagnosis present

## 2019-09-05 DIAGNOSIS — I480 Paroxysmal atrial fibrillation: Secondary | ICD-10-CM | POA: Diagnosis present

## 2019-09-05 DIAGNOSIS — R519 Headache, unspecified: Secondary | ICD-10-CM | POA: Diagnosis present

## 2019-09-05 DIAGNOSIS — R791 Abnormal coagulation profile: Secondary | ICD-10-CM | POA: Diagnosis present

## 2019-09-05 DIAGNOSIS — N186 End stage renal disease: Secondary | ICD-10-CM | POA: Diagnosis present

## 2019-09-05 DIAGNOSIS — M797 Fibromyalgia: Secondary | ICD-10-CM | POA: Diagnosis present

## 2019-09-05 DIAGNOSIS — K635 Polyp of colon: Secondary | ICD-10-CM | POA: Diagnosis present

## 2019-09-05 DIAGNOSIS — F329 Major depressive disorder, single episode, unspecified: Secondary | ICD-10-CM | POA: Diagnosis present

## 2019-09-05 DIAGNOSIS — K621 Rectal polyp: Secondary | ICD-10-CM | POA: Diagnosis present

## 2019-09-05 DIAGNOSIS — D500 Iron deficiency anemia secondary to blood loss (chronic): Secondary | ICD-10-CM | POA: Diagnosis not present

## 2019-09-05 DIAGNOSIS — K922 Gastrointestinal hemorrhage, unspecified: Secondary | ICD-10-CM | POA: Diagnosis not present

## 2019-09-05 DIAGNOSIS — I2510 Atherosclerotic heart disease of native coronary artery without angina pectoris: Secondary | ICD-10-CM | POA: Diagnosis present

## 2019-09-05 DIAGNOSIS — Z23 Encounter for immunization: Secondary | ICD-10-CM | POA: Diagnosis present

## 2019-09-05 DIAGNOSIS — Z20822 Contact with and (suspected) exposure to covid-19: Secondary | ICD-10-CM | POA: Diagnosis present

## 2019-09-05 DIAGNOSIS — E039 Hypothyroidism, unspecified: Secondary | ICD-10-CM

## 2019-09-05 DIAGNOSIS — K5520 Angiodysplasia of colon without hemorrhage: Secondary | ICD-10-CM | POA: Diagnosis present

## 2019-09-05 DIAGNOSIS — E1122 Type 2 diabetes mellitus with diabetic chronic kidney disease: Secondary | ICD-10-CM | POA: Diagnosis present

## 2019-09-05 DIAGNOSIS — I132 Hypertensive heart and chronic kidney disease with heart failure and with stage 5 chronic kidney disease, or end stage renal disease: Secondary | ICD-10-CM | POA: Diagnosis present

## 2019-09-05 DIAGNOSIS — Z992 Dependence on renal dialysis: Secondary | ICD-10-CM | POA: Diagnosis not present

## 2019-09-05 DIAGNOSIS — M1990 Unspecified osteoarthritis, unspecified site: Secondary | ICD-10-CM | POA: Diagnosis present

## 2019-09-05 LAB — BASIC METABOLIC PANEL
Anion gap: 10 (ref 5–15)
BUN: 64 mg/dL — ABNORMAL HIGH (ref 8–23)
CO2: 22 mmol/L (ref 22–32)
Calcium: 8.8 mg/dL — ABNORMAL LOW (ref 8.9–10.3)
Chloride: 105 mmol/L (ref 98–111)
Creatinine, Ser: 4.83 mg/dL — ABNORMAL HIGH (ref 0.44–1.00)
GFR calc Af Amer: 10 mL/min — ABNORMAL LOW (ref >60)
GFR calc non Af Amer: 9 mL/min — ABNORMAL LOW (ref >60)
Glucose, Bld: 137 mg/dL — ABNORMAL HIGH (ref 70–99)
Potassium: 4.7 mmol/L (ref 3.5–5.1)
Sodium: 137 mmol/L (ref 135–145)

## 2019-09-05 LAB — CBC WITH DIFFERENTIAL/PLATELET
Abs Immature Granulocytes: 0.03 10*3/uL (ref 0.00–0.07)
Basophils Absolute: 0.1 10*3/uL (ref 0.0–0.1)
Basophils Relative: 1 %
Eosinophils Absolute: 0.1 10*3/uL (ref 0.0–0.5)
Eosinophils Relative: 1 %
HCT: 21.8 % — ABNORMAL LOW (ref 36.0–46.0)
Hemoglobin: 7.3 g/dL — ABNORMAL LOW (ref 12.0–15.0)
Immature Granulocytes: 1 %
Lymphocytes Relative: 11 %
Lymphs Abs: 0.7 10*3/uL (ref 0.7–4.0)
MCH: 29 pg (ref 26.0–34.0)
MCHC: 33.5 g/dL (ref 30.0–36.0)
MCV: 86.5 fL (ref 80.0–100.0)
Monocytes Absolute: 0.4 10*3/uL (ref 0.1–1.0)
Monocytes Relative: 7 %
Neutro Abs: 5 10*3/uL (ref 1.7–7.7)
Neutrophils Relative %: 79 %
Platelets: 148 10*3/uL — ABNORMAL LOW (ref 150–400)
RBC: 2.52 MIL/uL — ABNORMAL LOW (ref 3.87–5.11)
RDW: 14 % (ref 11.5–15.5)
WBC: 6.3 10*3/uL (ref 4.0–10.5)
nRBC: 0 % (ref 0.0–0.2)

## 2019-09-05 LAB — BPAM FFP
Blood Product Expiration Date: 202103112359
ISSUE DATE / TIME: 202103071251
Unit Type and Rh: 6200

## 2019-09-05 LAB — PREPARE FRESH FROZEN PLASMA: Unit division: 0

## 2019-09-05 LAB — PROTIME-INR
INR: 1.4 — ABNORMAL HIGH (ref 0.8–1.2)
Prothrombin Time: 17.3 seconds — ABNORMAL HIGH (ref 11.4–15.2)

## 2019-09-05 LAB — PREPARE RBC (CROSSMATCH)

## 2019-09-05 LAB — HEMOGLOBIN AND HEMATOCRIT, BLOOD
HCT: 27.6 % — ABNORMAL LOW (ref 36.0–46.0)
Hemoglobin: 9.8 g/dL — ABNORMAL LOW (ref 12.0–15.0)

## 2019-09-05 MED ORDER — CALCITRIOL 0.5 MCG PO CAPS
ORAL_CAPSULE | ORAL | Status: AC
  Administered 2019-09-05: 0.5 ug via ORAL
  Filled 2019-09-05: qty 1

## 2019-09-05 MED ORDER — AMLODIPINE BESYLATE 5 MG PO TABS
5.0000 mg | ORAL_TABLET | Freq: Every day | ORAL | Status: DC
  Administered 2019-09-05: 5 mg via ORAL
  Filled 2019-09-05: qty 1

## 2019-09-05 MED ORDER — HEPARIN SODIUM (PORCINE) 1000 UNIT/ML IJ SOLN
INTRAMUSCULAR | Status: AC
  Administered 2019-09-05: 1000 [IU]
  Filled 2019-09-05: qty 6

## 2019-09-05 MED ORDER — FENTANYL CITRATE (PF) 100 MCG/2ML IJ SOLN
INTRAMUSCULAR | Status: AC
  Administered 2019-09-05: 50 ug via INTRAVENOUS
  Filled 2019-09-05: qty 2

## 2019-09-05 MED ORDER — SODIUM CHLORIDE 0.9% IV SOLUTION: Freq: Once | INTRAVENOUS | Status: AC

## 2019-09-05 MED ORDER — PEG 3350-KCL-NA BICARB-NACL 420 G PO SOLR
4000.0000 mL | Freq: Once | ORAL | Status: AC
  Administered 2019-09-05: 4000 mL via ORAL
  Filled 2019-09-05: qty 4000

## 2019-09-05 MED ORDER — DARBEPOETIN ALFA 60 MCG/0.3ML IJ SOSY: 60.0000 ug | PREFILLED_SYRINGE | INTRAMUSCULAR | Status: DC

## 2019-09-05 MED ORDER — ALPRAZOLAM 0.25 MG PO TABS
0.2500 mg | ORAL_TABLET | Freq: Two times a day (BID) | ORAL | Status: DC | PRN
  Administered 2019-09-05 – 2019-09-07 (×4): 0.5 mg via ORAL
  Filled 2019-09-05 (×4): qty 2

## 2019-09-05 MED ORDER — DOXAZOSIN MESYLATE 8 MG PO TABS
4.0000 mg | ORAL_TABLET | Freq: Every day | ORAL | Status: DC
  Administered 2019-09-05 – 2019-09-08 (×3): 4 mg via ORAL
  Filled 2019-09-05 (×3): qty 1

## 2019-09-05 NOTE — Progress Notes (Signed)
Subjective: Patient continues to have dark maroon-colored bowel movements, 3 episodes yesterday and 2 episodes so far today.  She received 1 unit PRBC transfusion yesterday and will be receiving 1 unit PRBC transfusion today.  She finished her dialysis session today. She complained of cramps in abdomen and legs during dialysis.  Objective: Vital signs in last 24 hours: Temp:  [97.3 F (36.3 C)-100 F (37.8 C)] 100 F (37.8 C) (03/08 1131) Pulse Rate:  [77-102] 85 (03/08 1131) Resp:  [14-20] 14 (03/08 1131) BP: (161-200)/(65-94) 194/76 (03/08 1131) SpO2:  [94 %-100 %] 100 % (03/08 1131) Weight:  [55.1 kg-58 kg] 55.1 kg (03/08 1029) Weight change: -0.091 kg Last BM Date: 09/04/19  PE: Thinly built, frail, prominent pallor GENERAL: Mildly short of breath, able to speak in full sentences ABDOMEN: Soft, nondistended, nontender EXTREMITIES: No deformity  Lab Results: Results for orders placed or performed during the hospital encounter of 09/04/19 (from the past 48 hour(s))  Type and screen [place]     Status: None (Preliminary result)   Collection Time: 09/04/19  3:02 AM  Result Value Ref Range   ABO/RH(D) O POS    Antibody Screen NEG    Sample Expiration      09/07/2019,2359 Performed at [facility], [place] [address], [place], [place] [address]    Unit Number [id number]    Blood Component Type RED CELLS,LR    Unit division 00    Status of Unit ISSUED,FINAL    Transfusion Status OK TO TRANSFUSE    Crossmatch Result Compatible    Unit Number [id number]    Blood Component Type RED CELLS,LR    Unit division 00    Status of Unit ALLOCATED    Transfusion Status OK TO TRANSFUSE    Crossmatch Result Compatible    Unit Number [id number]    Blood Component Type RED CELLS,LR    Unit division 00    Status of Unit ALLOCATED    Transfusion Status OK TO TRANSFUSE    Crossmatch Result Compatible    Unit Number [id number]    Blood Component Type  RED CELLS,LR    Unit division 00    Status of Unit ALLOCATED    Transfusion Status OK TO TRANSFUSE    Crossmatch Result Compatible   Comprehensive metabolic panel     Status: Abnormal   Collection Time: 09/04/19  3:06 AM  Result Value Ref Range   Sodium 140 135 - 145 mmol/L   Potassium 4.1 3.5 - 5.1 mmol/L   Chloride 101 98 - 111 mmol/L   CO2 27 22 - 32 mmol/L   Glucose, Bld 175 (H) 70 - 99 mg/dL    Comment: Glucose reference range applies only to samples taken after fasting for at least 8 hours.   BUN 67 (H) 8 - 23 mg/dL   Creatinine, Ser 5.41 (H) 0.44 - 1.00 mg/dL   Calcium 8.7 (L) 8.9 - 10.3 mg/dL   Total Protein 5.8 (L) 6.5 - 8.1 g/dL   Albumin 3.4 (L) 3.5 - 5.0 g/dL   AST 18 15 - 41 U/L   ALT 15 0 - 44 U/L   Alkaline Phosphatase 70 38 - 126 U/L   Total Bilirubin 0.8 0.3 - 1.2 mg/dL   GFR calc non Af Amer 8 (L) >60 mL/min   GFR calc Af Amer 9 (L) >60 mL/min   Anion gap 12 5 - 15    Comment: Performed at [facility]  [address] [place], [place] [address]  CBC     Status: Abnormal   Collection Time: 09/04/19  3:06 AM  Result Value Ref Range   WBC 6.6 4.0 - 10.5 K/uL   RBC 2.62 (L) 3.87 - 5.11 MIL/uL   Hemoglobin 7.6 (L) 12.0 - 15.0 g/dL   HCT 23.2 (L) 36.0 - 46.0 %   MCV 88.5 80.0 - 100.0 fL   MCH 29.0 26.0 - 34.0 pg   MCHC 32.8 30.0 - 36.0 g/dL   RDW 14.5 11.5 - 15.5 %   Platelets 151 150 - 400 K/uL   nRBC 0.0 0.0 - 0.2 %    Comment: Performed at [facility], [place] [address] [place], [place] [address]  Protime-INR     Status: Abnormal   Collection Time: 09/04/19  3:46 AM  Result Value Ref Range   Prothrombin Time 36.4 (H) 11.4 - 15.2 seconds   INR 3.7 (H) 0.8 - 1.2    Comment: (NOTE) INR goal varies based on device and disease states. Performed at [facility], [place] [address] [place], [place] [address]   Prepare RBC     Status: None   Collection Time: 09/04/19  4:30 AM  Result Value Ref Range   Order Confirmation      ORDER PROCESSED  BY BLOOD BANK Performed at [facility], [place] [address], [place], [place] [address]   SARS CORONAVIRUS 2 (TAT 6-24 HRS) Nasopharyngeal Nasopharyngeal Swab     Status: None   Collection Time: 09/04/19  4:37 AM   Specimen: Nasopharyngeal Swab  Result Value Ref Range   SARS Coronavirus 2 NEGATIVE NEGATIVE    Comment: (NOTE) SARS-CoV-2 target nucleic acids are NOT DETECTED. The SARS-CoV-2 RNA is generally detectable in upper and lower respiratory specimens during the acute phase of infection. Negative results do not preclude SARS-CoV-2 infection, do not rule out co-infections with other pathogens, and should not be used as the sole basis for treatment or other patient management decisions. Negative results must be combined with clinical observations, patient history, and epidemiological information. The expected result is Negative. Fact Sheet for Patients: SugarRoll.be Fact Sheet for Healthcare Providers: https://www.woods-mathews.com/ This test is not yet approved or cleared by the [place] FDA and  has been authorized for detection and/or diagnosis of SARS-CoV-2 by FDA under an Emergency Use Authorization (EUA). This EUA will remain  in effect (meaning this test can be used) for the duration of the COVID-19 declaration under Section 56 4(b)(1) of the Act, 21 U.S.C. section 360bbb-3(b)(1), unless the authorization is terminated or revoked sooner. Performed at [facility], [place] [address] [place], [place] [address]   Prepare fresh frozen plasma     Status: None   Collection Time: 09/04/19  5:08 AM  Result Value Ref Range   Unit Number [id number]    Blood Component Type THAWED PLASMA    Unit division 00    Status of Unit ISSUED,FINAL    Transfusion Status      OK TO TRANSFUSE Performed at [facility] [address], [place], [place] [address]   Hemoglobin and hematocrit, blood     Status: Abnormal   Collection  Time: 09/04/19 10:52 AM  Result Value Ref Range   Hemoglobin 8.8 (L) 12.0 - 15.0 g/dL   HCT 25.9 (L) 36.0 - 46.0 %    Comment: Performed at [facility], [place] [address] [place],  [address]  CBC  Status: Abnormal   Collection Time: 09/04/19  7:43 PM  Result Value Ref Range   WBC 6.9 4.0 - 10.5 K/uL   RBC 2.72 (L) 3.87 - 5.11 MIL/uL   Hemoglobin 7.9 (L) 12.0 - 15.0 g/dL   HCT 23.5 (L) 36.0 - 46.0 %   MCV 86.4 80.0 - 100.0 fL   MCH 29.0 26.0 - 34.0 pg   MCHC 33.6 30.0 - 36.0 g/dL   RDW 14.1 11.5 - 15.5 %   Platelets 144 (L) 150 - 400 K/uL   nRBC 0.0 0.0 - 0.2 %    Comment: Performed at [facility], [place] [address], [place], [place] [address]  Basic metabolic panel     Status: Abnormal   Collection Time: 09/05/19  4:39 AM  Result Value Ref Range   Sodium 137 135 - 145 mmol/L   Potassium 4.7 3.5 - 5.1 mmol/L   Chloride 105 98 - 111 mmol/L   CO2 22 22 - 32 mmol/L   Glucose, Bld 137 (H) 70 - 99 mg/dL    Comment: Glucose reference range applies only to samples taken after fasting for at least 8 hours.   BUN 64 (H) 8 - 23 mg/dL   Creatinine, Ser 4.83 (H) 0.44 - 1.00 mg/dL   Calcium 8.8 (L) 8.9 - 10.3 mg/dL   GFR calc non Af Amer 9 (L) >60 mL/min   GFR calc Af Amer 10 (L) >60 mL/min   Anion gap 10 5 - 15    Comment: Performed at [facility] [address], [place], [place] [address]  Protime-INR     Status: Abnormal   Collection Time: 09/05/19  4:39 AM  Result Value Ref Range   Prothrombin Time 17.3 (H) 11.4 - 15.2 seconds   INR 1.4 (H) 0.8 - 1.2    Comment: (NOTE) INR goal varies based on device and disease states. Performed at [facility], [place] [address], [place], [place] [address]   CBC with Differential/Platelet     Status: Abnormal   Collection Time: 09/05/19  4:39 AM  Result Value Ref Range   WBC 6.3 4.0 - 10.5 K/uL   RBC 2.52 (L) 3.87 - 5.11 MIL/uL   Hemoglobin 7.3 (L) 12.0 - 15.0 g/dL   HCT 21.8 (L) 36.0 - 46.0 %   MCV 86.5 80.0  - 100.0 fL   MCH 29.0 26.0 - 34.0 pg   MCHC 33.5 30.0 - 36.0 g/dL   RDW 14.0 11.5 - 15.5 %   Platelets 148 (L) 150 - 400 K/uL   nRBC 0.0 0.0 - 0.2 %   Neutrophils Relative % 79 %   Neutro Abs 5.0 1.7 - 7.7 K/uL   Lymphocytes Relative 11 %   Lymphs Abs 0.7 0.7 - 4.0 K/uL   Monocytes Relative 7 %   Monocytes Absolute 0.4 0.1 - 1.0 K/uL   Eosinophils Relative 1 %   Eosinophils Absolute 0.1 0.0 - 0.5 K/uL   Basophils Relative 1 %   Basophils Absolute 0.1 0.0 - 0.1 K/uL   Immature Granulocytes 1 %   Abs Immature Granulocytes 0.03 0.00 - 0.07 K/uL    Comment: Performed at [facility] [address] [place], [place] [address]  Prepare RBC     Status: None   Collection Time: 09/05/19 11:09 AM  Result Value Ref Range   Order Confirmation      ORDER PROCESSED BY BLOOD BANK Performed at [facility], [address] [place], [place] [address]     Studies/Results: CT ABDOMEN PELVIS WO CONTRAST  Result Date: 09/04/2019 CLINICAL DATA:  Abdominal pain.  GI bleed. EXAM: CT ABDOMEN AND PELVIS WITHOUT CONTRAST TECHNIQUE: Multidetector CT imaging of the abdomen and pelvis was performed following the standard protocol without IV contrast. COMPARISON:  03/26/2017 FINDINGS: Lower chest: There is diffuse bronchial wall thickening at the right lung base. There is interlobular septal thickening at the lung bases bilaterally, right worse than left. There is a small right-sided effusion.There is cardiomegaly. The intracardiac blood pool is hypodense relative to the adjacent myocardium consistent with anemia. There is a right femoral approach dialysis catheter that extends into the right atrium. Hepatobiliary: The liver is normal. Cholelithiasis without acute inflammation. There is no biliary ductal dilation. Pancreas: Normal contours without ductal dilatation. No peripancreatic fluid collection. Spleen: No splenic laceration or hematoma. Adrenals/Urinary Tract: --Adrenal glands: No adrenal  hemorrhage. --Right kidney/ureter: No hydronephrosis or perinephric hematoma. --Left kidney/ureter: The left kidney is atrophic. --Urinary bladder: Unremarkable. Stomach/Bowel: --Stomach/Duodenum: No hiatal hernia or other gastric abnormality. Normal duodenal course and caliber. --Small bowel: No dilatation or inflammation. --Colon: There is scattered colonic diverticula without CT evidence for diverticulitis. There is a probable area peristalsis involving the ascending colon near the hepatic flexure (coronal series 6, image 54). --Appendix: Normal. Vascular/Lymphatic: Atherosclerotic calcification is present within the non-aneurysmal abdominal aorta, without hemodynamically significant stenosis. --No retroperitoneal lymphadenopathy. --No mesenteric lymphadenopathy. --No pelvic or inguinal lymphadenopathy. Reproductive: The patient is status post hysterectomy. There is a cystic mass involving the left ovary measuring approximately 3.7 cm (axial series 3, image 71). Other: No ascites or free air. The abdominal wall is normal. Musculoskeletal. No acute displaced fractures. IMPRESSION: 1. Evaluation limited by lack of IV contrast. 2. No definite finding to explain the patient's GI bleeding. 3. There is cholelithiasis without secondary signs of acute cholecystitis. 4. Cardiomegaly with a small right-sided pleural effusion. 5. Bronchial wall thickening and mucus plugging at the lung bases which may indicate underlying infectious or inflammatory bronchiolitis. 6. There is a right femoral approach dialysis catheter in place that extends into the right atrium. 7. There is a 3.7 cm left ovarian cystic mass. A follow-up pelvic ultrasound is recommended in 6 months. 8.  Aortic Atherosclerosis (ICD10-I70.0). Electronically Signed   By: Constance Holster M.D.   On: 09/04/2019 23:11   DG CHEST PORT 1 VIEW  Result Date: 09/04/2019 CLINICAL DATA:  Cough EXAM: PORTABLE CHEST 1 VIEW COMPARISON:  04/11/2019 FINDINGS: There is a  multi lead left-sided pacemaker in place. The heart size is enlarged. The patient is status post prior median sternotomy, valve replacement, and atrial appendage clip placement. There is no pneumothorax. There is vascular congestion with early developing pulmonary edema. There is no large pleural effusion. There is no acute osseous abnormality. No large focal infiltrate. Aortic calcifications are noted. IMPRESSION: Cardiomegaly with vascular congestion and early developing pulmonary edema. Electronically Signed   By: Constance Holster M.D.   On: 09/04/2019 19:10    Medications: I have reviewed the patient's current medications.  Assessment: Painless hematochezia with CT evidence of scattered colonic diverticuli Hemoglobin 7.9 yesterday, 7.3 today, slightly low platelets of 148 PT/INR of 17.3/1.4, Coumadin on hold  End-stage renal disease on dialysis Atrial fibrillation and history of bioprosthetic bovine aortic valve, was on Coumadin and aspirin 81 mg History of congestive heart failure, diabetes, depression, dyslipidemia, hypertension, hypothyroidism  Plan: Clear liquid diet and colonic prep to be given today in anticipation  of endoscopy and colonoscopy tomorrow. Hemodynamically stable, continue PPI IV drip for now, continue to hold Coumadin.   [name], MD 09/05/2019, 11:40 AM

## 2019-09-05 NOTE — Progress Notes (Addendum)
PROGRESS NOTE    Jocelyn Hill  WCH:[id number]  DOB: 1950/09/09  PCP: [name], MD Admit date:09/04/2019  69 y/o F with h/o CAD, PAF and prosthetic heart valve,on chronic anticoagulation with coumadin,  ESRD on HD, chronic combined CHF, hypothyroidism, hypertension, chronic pain, presented for evaluation blood clots per rectum and associated abdominal pain, generalized, cramping in character, and without any alleviating or exacerbating factors. She denies any alcohol or NSAID use, and reports that she takes Protonix daily. She underwent colonoscopy "years ago,"believes that aside from hemorrhoids, there were no abnormal findings and reports that she was due for repeat screening colonoscopy later this month. ED Course: Afebrile, hemodynamically stable. Chemistry panel is notable for BUN of 67 CBC features a hemoglobin of 7.6, down from 11.2 in January. INR is elevated to 3.7.  1 unit PRBC transfusion initiated, GI was consulted by the EDP who recommended warfarin reversal and hospitalist admission. Hospital course: Patient admitted to [facility] with PPI drip. Nephrology consulted for dialysis. She missed her dialysis on Friday due to not feeling good.   Subjective:  Patient returned from dialysis and appeared anxious and tearful while talking about care plan.  She states she tends to have anxiety spells and takes Xanax at home usually.  Objective: Vitals:   09/05/19 1131 09/05/19 1230 09/05/19 1245 09/05/19 1415  BP: (!) 194/76 (!) 178/72 (!) 172/85 (!) 172/91  Pulse: 85  83   Resp: 14 19 18 20   Temp: 100 F (37.8 C) 98.8 F (37.1 C) 98.1 F (36.7 C) 98.2 F (36.8 C)  TempSrc: Oral Oral Oral Oral  SpO2: 100% 100% 100% 98%  Weight:      Height:        Intake/Output Summary (Last 24 hours) at 09/05/2019 1910 Last data filed at 09/05/2019 1820 Gross per 24 hour  Intake 1835 ml  Output 2648 ml  Net -813 ml   Filed Weights   09/05/19 0337 09/05/19 0701 09/05/19 1029  Weight: 56.6  kg 58 kg 55.1 kg    Physical Examination:  General exam: Appears calm and comfortable  Respiratory system: Clear to auscultation. Respiratory effort normal. Cardiovascular system: S1 & S2 heard, RRR. No JVD, murmurs, rubs, gallops or clicks. No pedal edema. Gastrointestinal system: Abdomen is nondistended, soft and nontender. Normal bowel sounds heard. Central nervous system: Alert and oriented. No new focal neurological deficits. Extremities: No contractures, edema or joint deformities.  Skin: No rashes, lesions or ulcers Psychiatry: Judgement and insight appear normal. Mood & affect appropriate.   Data Reviewed: I have personally reviewed following labs and imaging studies  CBC: Recent Labs  Lab 09/04/19 0306 09/04/19 1052 09/04/19 1943 09/05/19 0439  WBC 6.6  --  6.9 6.3  NEUTROABS  --   --   --  5.0  HGB 7.6* 8.8* 7.9* 7.3*  HCT 23.2* 25.9* 23.5* 21.8*  MCV 88.5  --  86.4 86.5  PLT 151  --  144* 353*   Basic Metabolic Panel: Recent Labs  Lab 09/04/19 0306 09/05/19 0439  NA 140 137  K 4.1 4.7  CL 101 105  CO2 27 22  GLUCOSE 175* 137*  BUN 67* 64*  CREATININE 5.41* 4.83*  CALCIUM 8.7* 8.8*   GFR: Estimated Creatinine Clearance: 9.7 mL/min (A) (by C-G formula based on SCr of 4.83 mg/dL (H)). Liver Function Tests: Recent Labs  Lab 09/04/19 0306  AST 18  ALT 15  ALKPHOS 70  BILITOT 0.8  PROT 5.8*  ALBUMIN 3.4*  No results for input(s): LIPASE, AMYLASE in the last 168 hours. No results for input(s): AMMONIA in the last 168 hours. Coagulation Profile: Recent Labs  Lab 09/04/19 0346 09/05/19 0439  INR 3.7* 1.4*   Cardiac Enzymes: No results for input(s): CKTOTAL, CKMB, CKMBINDEX, TROPONINI in the last 168 hours. BNP (last 3 results) No results for input(s): PROBNP in the last 8760 hours. HbA1C: No results for input(s): HGBA1C in the last 72 hours. CBG: No results for input(s): GLUCAP in the last 168 hours. Lipid Profile: No results for input(s):  CHOL, HDL, LDLCALC, TRIG, CHOLHDL, LDLDIRECT in the last 72 hours. Thyroid Function Tests: No results for input(s): TSH, T4TOTAL, FREET4, T3FREE, THYROIDAB in the last 72 hours. Anemia Panel: No results for input(s): VITAMINB12, FOLATE, FERRITIN, TIBC, IRON, RETICCTPCT in the last 72 hours. Sepsis Labs: No results for input(s): PROCALCITON, LATICACIDVEN in the last 168 hours.  Recent Results (from the past 240 hour(s))  SARS CORONAVIRUS 2 (TAT 6-24 HRS) Nasopharyngeal Nasopharyngeal Swab     Status: None   Collection Time: 09/04/19  4:37 AM   Specimen: Nasopharyngeal Swab  Result Value Ref Range Status   SARS Coronavirus 2 NEGATIVE NEGATIVE Final    Comment: (NOTE) SARS-CoV-2 target nucleic acids are NOT DETECTED. The SARS-CoV-2 RNA is generally detectable in upper and lower respiratory specimens during the acute phase of infection. Negative results do not preclude SARS-CoV-2 infection, do not rule out co-infections with other pathogens, and should not be used as the sole basis for treatment or other patient management decisions. Negative results must be combined with clinical observations, patient history, and epidemiological information. The expected result is Negative. Fact Sheet for Patients: SugarRoll.be Fact Sheet for Healthcare Providers: https://www.woods-mathews.com/ This test is not yet approved or cleared by the [place] FDA and  has been authorized for detection and/or diagnosis of SARS-CoV-2 by FDA under an Emergency Use Authorization (EUA). This EUA will remain  in effect (meaning this test can be used) for the duration of the COVID-19 declaration under Section 56 4(b)(1) of the Act, 21 U.S.C. section 360bbb-3(b)(1), unless the authorization is terminated or revoked sooner. Performed at [facility], [place] [address], [place], [place] [address]       Radiology Studies: CT ABDOMEN PELVIS WO CONTRAST  Result  Date: 09/04/2019 CLINICAL DATA:  Abdominal pain.  GI bleed. EXAM: CT ABDOMEN AND PELVIS WITHOUT CONTRAST TECHNIQUE: Multidetector CT imaging of the abdomen and pelvis was performed following the standard protocol without IV contrast. COMPARISON:  03/26/2017 FINDINGS: Lower chest: There is diffuse bronchial wall thickening at the right lung base. There is interlobular septal thickening at the lung bases bilaterally, right worse than left. There is a small right-sided effusion.There is cardiomegaly. The intracardiac blood pool is hypodense relative to the adjacent myocardium consistent with anemia. There is a right femoral approach dialysis catheter that extends into the right atrium. Hepatobiliary: The liver is normal. Cholelithiasis without acute inflammation. There is no biliary ductal dilation. Pancreas: Normal contours without ductal dilatation. No peripancreatic fluid collection. Spleen: No splenic laceration or hematoma. Adrenals/Urinary Tract: --Adrenal glands: No adrenal hemorrhage. --Right kidney/ureter: No hydronephrosis or perinephric hematoma. --Left kidney/ureter: The left kidney is atrophic. --Urinary bladder: Unremarkable. Stomach/Bowel: --Stomach/Duodenum: No hiatal hernia or other gastric abnormality. Normal duodenal course and caliber. --Small bowel: No dilatation or inflammation. --Colon: There is scattered colonic diverticula without CT evidence for diverticulitis. There is a probable area peristalsis involving the ascending colon near the hepatic flexure (coronal series 6, image  95). --Appendix: Normal. Vascular/Lymphatic: Atherosclerotic calcification is present within the non-aneurysmal abdominal aorta, without hemodynamically significant stenosis. --No retroperitoneal lymphadenopathy. --No mesenteric lymphadenopathy. --No pelvic or inguinal lymphadenopathy. Reproductive: The patient is status post hysterectomy. There is a cystic mass involving the left ovary measuring approximately 3.7 cm  (axial series 3, image 71). Other: No ascites or free air. The abdominal wall is normal. Musculoskeletal. No acute displaced fractures. IMPRESSION: 1. Evaluation limited by lack of IV contrast. 2. No definite finding to explain the patient's GI bleeding. 3. There is cholelithiasis without secondary signs of acute cholecystitis. 4. Cardiomegaly with a small right-sided pleural effusion. 5. Bronchial wall thickening and mucus plugging at the lung bases which may indicate underlying infectious or inflammatory bronchiolitis. 6. There is a right femoral approach dialysis catheter in place that extends into the right atrium. 7. There is a 3.7 cm left ovarian cystic mass. A follow-up pelvic ultrasound is recommended in 6 months. 8.  Aortic Atherosclerosis (ICD10-I70.0). Electronically Signed   By: Constance Holster M.D.   On: 09/04/2019 23:11   DG CHEST PORT 1 VIEW  Result Date: 09/04/2019 CLINICAL DATA:  Cough EXAM: PORTABLE CHEST 1 VIEW COMPARISON:  04/11/2019 FINDINGS: There is a multi lead left-sided pacemaker in place. The heart size is enlarged. The patient is status post prior median sternotomy, valve replacement, and atrial appendage clip placement. There is no pneumothorax. There is vascular congestion with early developing pulmonary edema. There is no large pleural effusion. There is no acute osseous abnormality. No large focal infiltrate. Aortic calcifications are noted. IMPRESSION: Cardiomegaly with vascular congestion and early developing pulmonary edema. Electronically Signed   By: Constance Holster M.D.   On: 09/04/2019 19:10        Scheduled Meds: . atorvastatin  80 mg Oral QHS  . calcitRIOL  0.5 mcg Oral Q M,W,F-HD  . carvedilol  6.25 mg Oral BID WC  . Chlorhexidine Gluconate Cloth  6 each Topical Q0600  . [START ON 09/07/2019] darbepoetin (ARANESP) injection - DIALYSIS  60 mcg Intravenous Q Wed-HD  . fentaNYL      . fluticasone  2 spray Each Nare Daily  . guaiFENesin  600 mg Oral BID    . hydrALAZINE  100 mg Oral TID  . isosorbide mononitrate  120 mg Oral QHS  . levothyroxine  100 mcg Oral QAC breakfast  . mirtazapine  15 mg Oral QHS  . montelukast  10 mg Oral QHS  . [START ON 09/07/2019] pantoprazole  40 mg Intravenous Q12H  . pneumococcal 23 valent vaccine  0.5 mL Intramuscular Tomorrow-1000  . polyethylene glycol  17 g Oral BID  . sodium chloride flush  3 mL Intravenous Q12H  . sodium chloride flush  3 mL Intravenous Q12H   Continuous Infusions: . sodium chloride    . pantoprozole (PROTONIX) infusion 8 mg/hr (09/04/19 0812)  . pantoprazole (PROTONIX) IVPB     Assessment/Plan:  1. LowerGI bleeding with associated acute blood loss anemia: Patient has  chronically dark stool that she attributes to iron supplementation, and found to have hgb of 7.6, down from 11.2 in January, in the setting of supratherapeutic INR 3.7. s/p 1 unit RBC, 5 mg IV vit K, and FFP in ED -GI consult much appreciated-planning for colonoscopy on 3/9-Holding warfarin and ASA, on IV PPI. Trend Hgb post transfusion: 7.6->8.8->7.9->7.3.  Ordered another unit of PRBC this morning.  Obtain posttransfusion hemoglobin today and repeat level in a.m.  2.Paroxysmal atrial fibrillation; supratherapeutic INRat 3.7 in ED and reversed  due to #1.  Resume Coreg  3.ESRD-BUN elevated at 67 in the setting of GIB. No indication for urgent HD, will likely need maintenance dialysis during this admission - She has failed LUE access, awaiting second stage of right basilic v transposition, and currently dialyzed through tunneled right femoral catheter-Renally-dose medications, SLIV  4.Chronic combined systolic and diastolic CHF, bioprosthetic aortic valveEF was 30-35% in 2019 with grade II diastolic dysfunction-She appears compensated, volume management per HD  5.Hypertension-SBP 140's on admission-currently only on as needed medications in light of acute GIB. SBP up to 170s to 190s in the setting  of ESRD and anxiety.  Resume home medications (amlodipine, Cardura and Coreg) at lower doses.  6.CAD-No anginal complaints, ASA held  7.  Anxiety: Patient tearful and anxious during rounds this morning.  Requested home medication Xanax 0.5 mg prescription.  DVT prophylaxis: SCD, warfarin reversed Code Status: Full code Family / Patient Communication:  Disposition Plan:   Patient is home from prior to hospitalization. Received/Receiving inpatient care for acute GI bleed Discharge when  GI work-up completed and reinitiated on anticoagulation.    LOS: 0 days    Time spent: 35 minutes    Guilford Shi, MD [facility] Pager in [place]  If 7PM-7AM, please contact night-coverage [email or web address] 09/05/2019, 7:10 PM

## 2019-09-05 NOTE — H&P (View-Only) (Signed)
Subjective: Patient continues to have dark maroon-colored bowel movements, 3 episodes yesterday and 2 episodes so far today.  She received 1 unit PRBC transfusion yesterday and will be receiving 1 unit PRBC transfusion today.  She finished her dialysis session today. She complained of cramps in abdomen and legs during dialysis.  Objective: Vital signs in last 24 hours: Temp:  [97.3 F (36.3 C)-100 F (37.8 C)] 100 F (37.8 C) (03/08 1131) Pulse Rate:  [77-102] 85 (03/08 1131) Resp:  [14-20] 14 (03/08 1131) BP: (161-200)/(65-94) 194/76 (03/08 1131) SpO2:  [94 %-100 %] 100 % (03/08 1131) Weight:  [55.1 kg-58 kg] 55.1 kg (03/08 1029) Weight change: -0.091 kg Last BM Date: 09/04/19  PE: Thinly built, frail, prominent pallor GENERAL: Mildly short of breath, able to speak in full sentences ABDOMEN: Soft, nondistended, nontender EXTREMITIES: No deformity  Lab Results: Results for orders placed or performed during the hospital encounter of 09/04/19 (from the past 48 hour(s))  Type and screen [place]     Status: None (Preliminary result)   Collection Time: 09/04/19  3:02 AM  Result Value Ref Range   ABO/RH(D) O POS    Antibody Screen NEG    Sample Expiration      09/07/2019,2359 Performed at [facility], [place] [address], [place], [place] [address]    Unit Number [id number]    Blood Component Type RED CELLS,LR    Unit division 00    Status of Unit ISSUED,FINAL    Transfusion Status OK TO TRANSFUSE    Crossmatch Result Compatible    Unit Number [id number]    Blood Component Type RED CELLS,LR    Unit division 00    Status of Unit ALLOCATED    Transfusion Status OK TO TRANSFUSE    Crossmatch Result Compatible    Unit Number [id number]    Blood Component Type RED CELLS,LR    Unit division 00    Status of Unit ALLOCATED    Transfusion Status OK TO TRANSFUSE    Crossmatch Result Compatible    Unit Number [id number]    Blood Component Type  RED CELLS,LR    Unit division 00    Status of Unit ALLOCATED    Transfusion Status OK TO TRANSFUSE    Crossmatch Result Compatible   Comprehensive metabolic panel     Status: Abnormal   Collection Time: 09/04/19  3:06 AM  Result Value Ref Range   Sodium 140 135 - 145 mmol/L   Potassium 4.1 3.5 - 5.1 mmol/L   Chloride 101 98 - 111 mmol/L   CO2 27 22 - 32 mmol/L   Glucose, Bld 175 (H) 70 - 99 mg/dL    Comment: Glucose reference range applies only to samples taken after fasting for at least 8 hours.   BUN 67 (H) 8 - 23 mg/dL   Creatinine, Ser 5.41 (H) 0.44 - 1.00 mg/dL   Calcium 8.7 (L) 8.9 - 10.3 mg/dL   Total Protein 5.8 (L) 6.5 - 8.1 g/dL   Albumin 3.4 (L) 3.5 - 5.0 g/dL   AST 18 15 - 41 U/L   ALT 15 0 - 44 U/L   Alkaline Phosphatase 70 38 - 126 U/L   Total Bilirubin 0.8 0.3 - 1.2 mg/dL   GFR calc non Af Amer 8 (L) >60 mL/min   GFR calc Af Amer 9 (L) >60 mL/min   Anion gap 12 5 - 15    Comment: Performed at [facility]  [address], [place], [place] [address]  CBC     Status: Abnormal   Collection Time: 09/04/19  3:06 AM  Result Value Ref Range   WBC 6.6 4.0 - 10.5 K/uL   RBC 2.62 (L) 3.87 - 5.11 MIL/uL   Hemoglobin 7.6 (L) 12.0 - 15.0 g/dL   HCT 23.2 (L) 36.0 - 46.0 %   MCV 88.5 80.0 - 100.0 fL   MCH 29.0 26.0 - 34.0 pg   MCHC 32.8 30.0 - 36.0 g/dL   RDW 14.5 11.5 - 15.5 %   Platelets 151 150 - 400 K/uL   nRBC 0.0 0.0 - 0.2 %    Comment: Performed at [facility], [place] [address], [place], [place] [address]  Protime-INR     Status: Abnormal   Collection Time: 09/04/19  3:46 AM  Result Value Ref Range   Prothrombin Time 36.4 (H) 11.4 - 15.2 seconds   INR 3.7 (H) 0.8 - 1.2    Comment: (NOTE) INR goal varies based on device and disease states. Performed at [facility], [place] [address], [place], [place] [address]   Prepare RBC     Status: None   Collection Time: 09/04/19  4:30 AM  Result Value Ref Range   Order Confirmation      ORDER PROCESSED  BY BLOOD BANK Performed at [facility], [place] [address] [place], [place] [address]   SARS CORONAVIRUS 2 (TAT 6-24 HRS) Nasopharyngeal Nasopharyngeal Swab     Status: None   Collection Time: 09/04/19  4:37 AM   Specimen: Nasopharyngeal Swab  Result Value Ref Range   SARS Coronavirus 2 NEGATIVE NEGATIVE    Comment: (NOTE) SARS-CoV-2 target nucleic acids are NOT DETECTED. The SARS-CoV-2 RNA is generally detectable in upper and lower respiratory specimens during the acute phase of infection. Negative results do not preclude SARS-CoV-2 infection, do not rule out co-infections with other pathogens, and should not be used as the sole basis for treatment or other patient management decisions. Negative results must be combined with clinical observations, patient history, and epidemiological information. The expected result is Negative. Fact Sheet for Patients: SugarRoll.be Fact Sheet for Healthcare Providers: https://www.woods-mathews.com/ This test is not yet approved or cleared by the [place] FDA and  has been authorized for detection and/or diagnosis of SARS-CoV-2 by FDA under an Emergency Use Authorization (EUA). This EUA will remain  in effect (meaning this test can be used) for the duration of the COVID-19 declaration under Section 56 4(b)(1) of the Act, 21 U.S.C. section 360bbb-3(b)(1), unless the authorization is terminated or revoked sooner. Performed at [facility], [place] [address], [place], [place] [address]   Prepare fresh frozen plasma     Status: None   Collection Time: 09/04/19  5:08 AM  Result Value Ref Range   Unit Number [id number]    Blood Component Type THAWED PLASMA    Unit division 00    Status of Unit ISSUED,FINAL    Transfusion Status      OK TO TRANSFUSE Performed at [facility] [address] [place], [place] [address]   Hemoglobin and hematocrit, blood     Status: Abnormal   Collection  Time: 09/04/19 10:52 AM  Result Value Ref Range   Hemoglobin 8.8 (L) 12.0 - 15.0 g/dL   HCT 25.9 (L) 36.0 - 46.0 %    Comment: Performed at [facility], [place] [address], [place], [place] [address]  CBC  Status: Abnormal   Collection Time: 09/04/19  7:43 PM  Result Value Ref Range   WBC 6.9 4.0 - 10.5 K/uL   RBC 2.72 (L) 3.87 - 5.11 MIL/uL   Hemoglobin 7.9 (L) 12.0 - 15.0 g/dL   HCT 23.5 (L) 36.0 - 46.0 %   MCV 86.4 80.0 - 100.0 fL   MCH 29.0 26.0 - 34.0 pg   MCHC 33.6 30.0 - 36.0 g/dL   RDW 14.1 11.5 - 15.5 %   Platelets 144 (L) 150 - 400 K/uL   nRBC 0.0 0.0 - 0.2 %    Comment: Performed at [facility], [place] [address] [place], [place] [address]  Basic metabolic panel     Status: Abnormal   Collection Time: 09/05/19  4:39 AM  Result Value Ref Range   Sodium 137 135 - 145 mmol/L   Potassium 4.7 3.5 - 5.1 mmol/L   Chloride 105 98 - 111 mmol/L   CO2 22 22 - 32 mmol/L   Glucose, Bld 137 (H) 70 - 99 mg/dL    Comment: Glucose reference range applies only to samples taken after fasting for at least 8 hours.   BUN 64 (H) 8 - 23 mg/dL   Creatinine, Ser 4.83 (H) 0.44 - 1.00 mg/dL   Calcium 8.8 (L) 8.9 - 10.3 mg/dL   GFR calc non Af Amer 9 (L) >60 mL/min   GFR calc Af Amer 10 (L) >60 mL/min   Anion gap 10 5 - 15    Comment: Performed at [facility] [address], [place], [place] [address]  Protime-INR     Status: Abnormal   Collection Time: 09/05/19  4:39 AM  Result Value Ref Range   Prothrombin Time 17.3 (H) 11.4 - 15.2 seconds   INR 1.4 (H) 0.8 - 1.2    Comment: (NOTE) INR goal varies based on device and disease states. Performed at [facility], [place] [address], [place], [place] [address]   CBC with Differential/Platelet     Status: Abnormal   Collection Time: 09/05/19  4:39 AM  Result Value Ref Range   WBC 6.3 4.0 - 10.5 K/uL   RBC 2.52 (L) 3.87 - 5.11 MIL/uL   Hemoglobin 7.3 (L) 12.0 - 15.0 g/dL   HCT 21.8 (L) 36.0 - 46.0 %   MCV 86.5 80.0  - 100.0 fL   MCH 29.0 26.0 - 34.0 pg   MCHC 33.5 30.0 - 36.0 g/dL   RDW 14.0 11.5 - 15.5 %   Platelets 148 (L) 150 - 400 K/uL   nRBC 0.0 0.0 - 0.2 %   Neutrophils Relative % 79 %   Neutro Abs 5.0 1.7 - 7.7 K/uL   Lymphocytes Relative 11 %   Lymphs Abs 0.7 0.7 - 4.0 K/uL   Monocytes Relative 7 %   Monocytes Absolute 0.4 0.1 - 1.0 K/uL   Eosinophils Relative 1 %   Eosinophils Absolute 0.1 0.0 - 0.5 K/uL   Basophils Relative 1 %   Basophils Absolute 0.1 0.0 - 0.1 K/uL   Immature Granulocytes 1 %   Abs Immature Granulocytes 0.03 0.00 - 0.07 K/uL    Comment: Performed at [facility] [address] [place], [place] [address]  Prepare RBC     Status: None   Collection Time: 09/05/19 11:09 AM  Result Value Ref Range   Order Confirmation      ORDER PROCESSED BY BLOOD BANK Performed at [facility], [address] [place], [place] [address]     Studies/Results: CT ABDOMEN PELVIS WO CONTRAST  Result Date: 09/04/2019 CLINICAL DATA:  Abdominal pain.  GI bleed. EXAM: CT ABDOMEN AND PELVIS WITHOUT CONTRAST TECHNIQUE: Multidetector CT imaging of the abdomen and pelvis was performed following the standard protocol without IV contrast. COMPARISON:  03/26/2017 FINDINGS: Lower chest: There is diffuse bronchial wall thickening at the right lung base. There is interlobular septal thickening at the lung bases bilaterally, right worse than left. There is a small right-sided effusion.There is cardiomegaly. The intracardiac blood pool is hypodense relative to the adjacent myocardium consistent with anemia. There is a right femoral approach dialysis catheter that extends into the right atrium. Hepatobiliary: The liver is normal. Cholelithiasis without acute inflammation. There is no biliary ductal dilation. Pancreas: Normal contours without ductal dilatation. No peripancreatic fluid collection. Spleen: No splenic laceration or hematoma. Adrenals/Urinary Tract: --Adrenal glands: No adrenal  hemorrhage. --Right kidney/ureter: No hydronephrosis or perinephric hematoma. --Left kidney/ureter: The left kidney is atrophic. --Urinary bladder: Unremarkable. Stomach/Bowel: --Stomach/Duodenum: No hiatal hernia or other gastric abnormality. Normal duodenal course and caliber. --Small bowel: No dilatation or inflammation. --Colon: There is scattered colonic diverticula without CT evidence for diverticulitis. There is a probable area peristalsis involving the ascending colon near the hepatic flexure (coronal series 6, image 54). --Appendix: Normal. Vascular/Lymphatic: Atherosclerotic calcification is present within the non-aneurysmal abdominal aorta, without hemodynamically significant stenosis. --No retroperitoneal lymphadenopathy. --No mesenteric lymphadenopathy. --No pelvic or inguinal lymphadenopathy. Reproductive: The patient is status post hysterectomy. There is a cystic mass involving the left ovary measuring approximately 3.7 cm (axial series 3, image 71). Other: No ascites or free air. The abdominal wall is normal. Musculoskeletal. No acute displaced fractures. IMPRESSION: 1. Evaluation limited by lack of IV contrast. 2. No definite finding to explain the patient's GI bleeding. 3. There is cholelithiasis without secondary signs of acute cholecystitis. 4. Cardiomegaly with a small right-sided pleural effusion. 5. Bronchial wall thickening and mucus plugging at the lung bases which may indicate underlying infectious or inflammatory bronchiolitis. 6. There is a right femoral approach dialysis catheter in place that extends into the right atrium. 7. There is a 3.7 cm left ovarian cystic mass. A follow-up pelvic ultrasound is recommended in 6 months. 8.  Aortic Atherosclerosis (ICD10-I70.0). Electronically Signed   By: Constance Holster M.D.   On: 09/04/2019 23:11   DG CHEST PORT 1 VIEW  Result Date: 09/04/2019 CLINICAL DATA:  Cough EXAM: PORTABLE CHEST 1 VIEW COMPARISON:  04/11/2019 FINDINGS: There is a  multi lead left-sided pacemaker in place. The heart size is enlarged. The patient is status post prior median sternotomy, valve replacement, and atrial appendage clip placement. There is no pneumothorax. There is vascular congestion with early developing pulmonary edema. There is no large pleural effusion. There is no acute osseous abnormality. No large focal infiltrate. Aortic calcifications are noted. IMPRESSION: Cardiomegaly with vascular congestion and early developing pulmonary edema. Electronically Signed   By: Constance Holster M.D.   On: 09/04/2019 19:10    Medications: I have reviewed the patient's current medications.  Assessment: Painless hematochezia with CT evidence of scattered colonic diverticuli Hemoglobin 7.9 yesterday, 7.3 today, slightly low platelets of 148 PT/INR of 17.3/1.4, Coumadin on hold  End-stage renal disease on dialysis Atrial fibrillation and history of bioprosthetic bovine aortic valve, was on Coumadin and aspirin 81 mg History of congestive heart failure, diabetes, depression, dyslipidemia, hypertension, hypothyroidism  Plan: Clear liquid diet and colonic prep to be given today in anticipation  of endoscopy and colonoscopy tomorrow. Hemodynamically stable, continue PPI IV drip for now, continue to hold Coumadin.   [name], MD 09/05/2019, 11:40 AM

## 2019-09-05 NOTE — Progress Notes (Signed)
[facility] Progress Note   Subjective:   Patient seen on HD. Still reporting SOB and mild orthopnea, + cough and chest pressure on HD. Reports ongoing GI bleeding. Denies N/V.   Objective Vitals:   09/05/19 0815 09/05/19 0845 09/05/19 0900 09/05/19 0930  BP: (!) 193/76 (!) 170/82 (!) 171/87 (!) 173/85  Pulse: 85 86 87 86  Resp:      Temp:      TempSrc:      SpO2:      Weight:      Height:       Physical Exam General: Well developed, well nourished female in NAD Heart: RRR, no murmurs, rubs or gallops Lungs: + crackles LLL. No wheezing ausculted. Respirations unlabored, O2 nasal cannula Abdomen: Soft, non-distended, +BS. mildly tender b/l upper quadrants.  Extremities: No edema b/l lower extremities Dialysis Access:  R thigh TDC accessed, maturing RUE AVF + bruit  Additional Objective Labs: Basic Metabolic Panel: Recent Labs  Lab 09/04/19 0306 09/05/19 0439  NA 140 137  K 4.1 4.7  CL 101 105  CO2 27 22  GLUCOSE 175* 137*  BUN 67* 64*  CREATININE 5.41* 4.83*  CALCIUM 8.7* 8.8*   Liver Function Tests: Recent Labs  Lab 09/04/19 0306  AST 18  ALT 15  ALKPHOS 70  BILITOT 0.8  PROT 5.8*  ALBUMIN 3.4*   CBC: Recent Labs  Lab 09/04/19 0306 09/04/19 0306 09/04/19 1052 09/04/19 1943 09/05/19 0439  WBC 6.6  --   --  6.9 6.3  NEUTROABS  --   --   --   --  5.0  HGB 7.6*   < > 8.8* 7.9* 7.3*  HCT 23.2*   < > 25.9* 23.5* 21.8*  MCV 88.5  --   --  86.4 86.5  PLT 151  --   --  144* 148*   < > = values in this interval not displayed.   Blood Culture    Component Value Date/Time   SDES WOUND LEFT UPPER ARM 04/13/2019 1205   SPECREQUEST PERIGRAFT SAMPLE A PT ON VANC 04/13/2019 1205   CULT  04/13/2019 1205    MODERATE STAPHYLOCOCCUS AUREUS NO ANAEROBES ISOLATED Performed at [facility], [place] [address] [place], [place] [address]    REPTSTATUS 04/18/2019 FINAL 04/13/2019 1205     Studies/Results: CT ABDOMEN PELVIS WO  CONTRAST  Result Date: 09/04/2019 CLINICAL DATA:  Abdominal pain.  GI bleed. EXAM: CT ABDOMEN AND PELVIS WITHOUT CONTRAST TECHNIQUE: Multidetector CT imaging of the abdomen and pelvis was performed following the standard protocol without IV contrast. COMPARISON:  03/26/2017 FINDINGS: Lower chest: There is diffuse bronchial wall thickening at the right lung base. There is interlobular septal thickening at the lung bases bilaterally, right worse than left. There is a small right-sided effusion.There is cardiomegaly. The intracardiac blood pool is hypodense relative to the adjacent myocardium consistent with anemia. There is a right femoral approach dialysis catheter that extends into the right atrium. Hepatobiliary: The liver is normal. Cholelithiasis without acute inflammation. There is no biliary ductal dilation. Pancreas: Normal contours without ductal dilatation. No peripancreatic fluid collection. Spleen: No splenic laceration or hematoma. Adrenals/Urinary Tract: --Adrenal glands: No adrenal hemorrhage. --Right kidney/ureter: No hydronephrosis or perinephric hematoma. --Left kidney/ureter: The left kidney is atrophic. --Urinary bladder: Unremarkable. Stomach/Bowel: --Stomach/Duodenum: No hiatal hernia or other gastric abnormality. Normal duodenal course and caliber. --Small bowel: No dilatation or inflammation. --Colon: There is scattered colonic diverticula without CT evidence for diverticulitis. There is a  probable area peristalsis involving the ascending colon near the hepatic flexure (coronal series 6, image 54). --Appendix: Normal. Vascular/Lymphatic: Atherosclerotic calcification is present within the non-aneurysmal abdominal aorta, without hemodynamically significant stenosis. --No retroperitoneal lymphadenopathy. --No mesenteric lymphadenopathy. --No pelvic or inguinal lymphadenopathy. Reproductive: The patient is status post hysterectomy. There is a cystic mass involving the left ovary measuring  approximately 3.7 cm (axial series 3, image 71). Other: No ascites or free air. The abdominal wall is normal. Musculoskeletal. No acute displaced fractures. IMPRESSION: 1. Evaluation limited by lack of IV contrast. 2. No definite finding to explain the patient's GI bleeding. 3. There is cholelithiasis without secondary signs of acute cholecystitis. 4. Cardiomegaly with a small right-sided pleural effusion. 5. Bronchial wall thickening and mucus plugging at the lung bases which may indicate underlying infectious or inflammatory bronchiolitis. 6. There is a right femoral approach dialysis catheter in place that extends into the right atrium. 7. There is a 3.7 cm left ovarian cystic mass. A follow-up pelvic ultrasound is recommended in 6 months. 8.  Aortic Atherosclerosis (ICD10-I70.0). Electronically Signed   By: Constance Holster M.D.   On: 09/04/2019 23:11   DG CHEST PORT 1 VIEW  Result Date: 09/04/2019 CLINICAL DATA:  Cough EXAM: PORTABLE CHEST 1 VIEW COMPARISON:  04/11/2019 FINDINGS: There is a multi lead left-sided pacemaker in place. The heart size is enlarged. The patient is status post prior median sternotomy, valve replacement, and atrial appendage clip placement. There is no pneumothorax. There is vascular congestion with early developing pulmonary edema. There is no large pleural effusion. There is no acute osseous abnormality. No large focal infiltrate. Aortic calcifications are noted. IMPRESSION: Cardiomegaly with vascular congestion and early developing pulmonary edema. Electronically Signed   By: Constance Holster M.D.   On: 09/04/2019 19:10   Medications: . sodium chloride    . pantoprozole (PROTONIX) infusion 8 mg/hr (09/04/19 0812)  . pantoprazole (PROTONIX) IVPB     . atorvastatin  80 mg Oral QHS  . calcitRIOL  0.5 mcg Oral Q M,W,F-HD  . carvedilol  6.25 mg Oral BID WC  . Chlorhexidine Gluconate Cloth  6 each Topical Q0600  . fentaNYL      . fluticasone  2 spray Each Nare Daily  .  guaiFENesin  600 mg Oral BID  . hydrALAZINE  100 mg Oral TID  . isosorbide mononitrate  120 mg Oral QHS  . levothyroxine  100 mcg Oral QAC breakfast  . mirtazapine  15 mg Oral QHS  . montelukast  10 mg Oral QHS  . [START ON 09/07/2019] pantoprazole  40 mg Intravenous Q12H  . pneumococcal 23 valent vaccine  0.5 mL Intramuscular Tomorrow-1000  . polyethylene glycol  17 g Oral BID  . sodium chloride flush  3 mL Intravenous Q12H  . sodium chloride flush  3 mL Intravenous Q12H    Dialysis Orders: Center: [facility] on MWF . MonWedFri, 3 hrs 30 min, 180NRe Optiflux, BFR 400, DFR Manual 800 mL/min, EDW 53 (kg), Dialysate 2.0 K, 2.25 Ca, UFR Profile: Profile 2, Sodium Model: None,  R thigh TDC Venofer 100 mg IV q HD x 5 doses- received 2/5, last tsat 28% Mircera 50 mcg IV q 2 weeks- last dose 2/24 Calcitriol 0.5 mcg PO TIW  Assessment/Plan: 1. Acute GI bleed: Hgb 7.6, INR elevated on presentation. Received 1 unit PRBC on 3/7 > Hgb 8.8 to 7.3 this AM. GI consulted and recommended PPI, reversal of coumadin, miralax. Plan for possible endoscopy/colonoscopy on 09/06/19.  2.  ESRD:  MWF schedule, missed last HD. Slightly volume overloaded today, BP elevated. UFG 3L, tolerating so far. Continue MWF schedule, next HD on 3/10.  3.  Hypertension/volume: BP elevated, 3.7kg over EDW and crackles due to increased fluid from missed HD. Currently comfortable and O2 sat stable on RA. UFG 3L with HD. 4.  Anemia: Hgb 7.3, see # 1. Due for ESA on 3/10, ordered aranesp.  5.  Metabolic bone disease: Corrected calcium at goal, phos pending. Patient is on clear liquid diet, hold binders for now. 6.  Nutrition:  Clear liquid diet, recommend renal diet with fluid restrictions once advanced.  7. Paroxysmal a. Fib: INR elevated on admission, per primary team 8. CHF: EF 30-35%, volume management with HD as above.   Anice Paganini, PA-C 09/05/2019, 9:59 AM  [facility] Pager: [phone]

## 2019-09-05 NOTE — Plan of Care (Signed)
  Problem: Pain Managment: Goal: General experience of comfort will improve Outcome: Progressing   Problem: Safety: Goal: Ability to remain free from injury will improve Outcome: Progressing   

## 2019-09-05 NOTE — Progress Notes (Signed)
Pt. With Hgb of 7.3 this am. On call for [facility] paged to make aware. Patrisha Hausmann, Katherine Roan

## 2019-09-06 ENCOUNTER — Encounter (HOSPITAL_COMMUNITY)
Admission: EM | Disposition: A | Discharge status: Home / Self Care | Payer: Self-pay | Source: Home / Self Care | Attending: Internal Medicine

## 2019-09-06 ENCOUNTER — Encounter (HOSPITAL_COMMUNITY): Payer: Self-pay | Admitting: Family Medicine

## 2019-09-06 ENCOUNTER — Inpatient Hospital Stay (HOSPITAL_COMMUNITY): Payer: Medicare Other | Admitting: Certified Registered"

## 2019-09-06 HISTORY — PX: HEMOSTASIS CLIP PLACEMENT: SHX6857

## 2019-09-06 HISTORY — PX: ESOPHAGOGASTRODUODENOSCOPY (EGD) WITH PROPOFOL: SHX5813

## 2019-09-06 HISTORY — PX: HOT HEMOSTASIS: SHX5433

## 2019-09-06 HISTORY — PX: BIOPSY: SHX5522

## 2019-09-06 HISTORY — PX: COLONOSCOPY WITH PROPOFOL: SHX5780

## 2019-09-06 HISTORY — PX: POLYPECTOMY: SHX5525

## 2019-09-06 LAB — HEMOGLOBIN AND HEMATOCRIT, BLOOD
HCT: 28.7 % — ABNORMAL LOW (ref 36.0–46.0)
HCT: 27.6 % — ABNORMAL LOW (ref 36.0–46.0)
Hemoglobin: 9.9 g/dL — ABNORMAL LOW (ref 12.0–15.0)
Hemoglobin: 9.4 g/dL — ABNORMAL LOW (ref 12.0–15.0)

## 2019-09-06 LAB — PROTIME-INR
INR: 1.2 (ref 0.8–1.2)
Prothrombin Time: 14.8 seconds (ref 11.4–15.2)

## 2019-09-06 SURGERY — ESOPHAGOGASTRODUODENOSCOPY (EGD) WITH PROPOFOL
Anesthesia: Monitor Anesthesia Care

## 2019-09-06 MED ORDER — FERRIC CITRATE 1 GM 210 MG(FE) PO TABS
210.0000 mg | ORAL_TABLET | Freq: Every day | ORAL | Status: DC
  Administered 2019-09-06 – 2019-09-08 (×3): 210 mg via ORAL
  Filled 2019-09-06 (×3): qty 1

## 2019-09-06 MED ORDER — GABAPENTIN 300 MG PO CAPS
300.0000 mg | ORAL_CAPSULE | ORAL | Status: DC
  Administered 2019-09-07: 300 mg via ORAL
  Filled 2019-09-06: qty 1

## 2019-09-06 MED ORDER — BUSPIRONE HCL 5 MG PO TABS
15.0000 mg | ORAL_TABLET | Freq: Two times a day (BID) | ORAL | Status: DC
  Administered 2019-09-06 – 2019-09-08 (×5): 15 mg via ORAL
  Filled 2019-09-06 (×5): qty 1

## 2019-09-06 MED ORDER — CHLORHEXIDINE GLUCONATE CLOTH 2 % EX PADS
6.0000 | MEDICATED_PAD | Freq: Every day | CUTANEOUS | Status: DC
  Administered 2019-09-07 – 2019-09-08 (×2): 6 via TOPICAL

## 2019-09-06 MED ORDER — NEPRO/CARBSTEADY PO LIQD
237.0000 mL | Freq: Two times a day (BID) | ORAL | Status: DC
  Administered 2019-09-06 – 2019-09-07 (×2): 237 mL via ORAL

## 2019-09-06 MED ORDER — SODIUM CHLORIDE 0.9 % IV SOLN: INTRAVENOUS | Status: DC

## 2019-09-06 MED ORDER — VITAMIN D 25 MCG (1000 UNIT) PO TABS
1000.0000 [IU] | ORAL_TABLET | Freq: Every day | ORAL | Status: DC
  Administered 2019-09-06 – 2019-09-07 (×2): 1000 [IU] via ORAL
  Filled 2019-09-06 (×2): qty 1

## 2019-09-06 MED ORDER — B COMPLEX PO TABS: 1.0000 | ORAL_TABLET | Freq: Every day | ORAL | Status: DC

## 2019-09-06 MED ORDER — DOXAZOSIN MESYLATE 8 MG PO TABS
8.0000 mg | ORAL_TABLET | Freq: Every day | ORAL | Status: DC
  Administered 2019-09-06 – 2019-09-07 (×2): 8 mg via ORAL
  Filled 2019-09-06 (×2): qty 1

## 2019-09-06 MED ORDER — PANTOPRAZOLE SODIUM 40 MG PO TBEC
40.0000 mg | DELAYED_RELEASE_TABLET | Freq: Every day | ORAL | Status: DC
  Administered 2019-09-07 – 2019-09-08 (×2): 40 mg via ORAL
  Filled 2019-09-06 (×2): qty 1

## 2019-09-06 MED ORDER — TRAZODONE HCL 100 MG PO TABS
100.0000 mg | ORAL_TABLET | Freq: Every day | ORAL | Status: DC
  Administered 2019-09-06 – 2019-09-07 (×2): 100 mg via ORAL
  Filled 2019-09-06 (×2): qty 1

## 2019-09-06 MED ORDER — DM-GUAIFENESIN ER 30-600 MG PO TB12: 1.0000 | ORAL_TABLET | Freq: Two times a day (BID) | ORAL | Status: DC | PRN

## 2019-09-06 MED ORDER — CARVEDILOL 6.25 MG PO TABS: 6.2500 mg | ORAL_TABLET | Freq: Two times a day (BID) | ORAL | Status: DC

## 2019-09-06 MED ORDER — MORPHINE SULFATE (PF) 2 MG/ML IV SOLN
2.0000 mg | INTRAVENOUS | Status: DC | PRN
  Administered 2019-09-06 – 2019-09-08 (×6): 2 mg via INTRAVENOUS
  Filled 2019-09-06 (×6): qty 1

## 2019-09-06 MED ORDER — RENA-VITE PO TABS
1.0000 | ORAL_TABLET | Freq: Every day | ORAL | Status: DC
  Administered 2019-09-06 – 2019-09-07 (×2): 1 via ORAL
  Filled 2019-09-06 (×2): qty 1

## 2019-09-06 MED ORDER — DIPHENHYDRAMINE HCL 25 MG PO CAPS
25.0000 mg | ORAL_CAPSULE | Freq: Every day | ORAL | Status: DC | PRN
  Administered 2019-09-07: 25 mg via ORAL
  Filled 2019-09-06: qty 1

## 2019-09-06 MED ORDER — CALCITRIOL 0.5 MCG PO CAPS: 0.5000 ug | ORAL_CAPSULE | ORAL | Status: DC

## 2019-09-06 MED ORDER — NEPRO/CARBSTEADY PO LIQD: 237.0000 mL | Freq: Every day | ORAL | Status: DC

## 2019-09-06 MED ORDER — PANTOPRAZOLE SODIUM 40 MG IV SOLR
40.0000 mg | Freq: Every morning | INTRAVENOUS | Status: AC
  Administered 2019-09-06: 40 mg via INTRAVENOUS
  Filled 2019-09-06: qty 40

## 2019-09-06 MED ORDER — POLYETHYLENE GLYCOL 3350 17 G PO PACK: 17.0000 g | PACK | Freq: Every day | ORAL | Status: DC | PRN

## 2019-09-06 MED ORDER — PROPOFOL 500 MG/50ML IV EMUL
INTRAVENOUS | Status: DC | PRN
  Administered 2019-09-06: 50 ug/kg/min via INTRAVENOUS

## 2019-09-06 MED ORDER — HEPARIN SODIUM (PORCINE) 1000 UNIT/ML IJ SOLN: 1000.0000 [IU] | INTRAMUSCULAR | Status: DC

## 2019-09-06 MED ORDER — LIDOCAINE HCL (CARDIAC) PF 100 MG/5ML IV SOSY
PREFILLED_SYRINGE | INTRAVENOUS | Status: DC | PRN
  Administered 2019-09-06: 40 mg via INTRATRACHEAL

## 2019-09-06 MED ORDER — AMLODIPINE BESYLATE 10 MG PO TABS
10.0000 mg | ORAL_TABLET | Freq: Every day | ORAL | Status: DC
  Administered 2019-09-06 – 2019-09-07 (×2): 10 mg via ORAL
  Filled 2019-09-06 (×2): qty 1

## 2019-09-06 SURGICAL SUPPLY — 25 items

## 2019-09-06 NOTE — Op Note (Signed)
[facility] Patient Name: Jocelyn Hill Procedure Date : 09/06/2019 MRN: [id number] Attending MD: [name] , MD Date of Birth: Aug 25, 1950 CSN: [id number] Age: 69 Admit Type: Inpatient Procedure:                Colonoscopy Indications:              Last colonoscopy 10 years ago, Rectal bleeding Providers:                [name], MD, Angus Seller, Janeece Agee,                            Technician Referring MD:             [facility] Medicines:                Monitored Anesthesia Care Complications:            No immediate complications. Estimated blood loss:                            None. Estimated Blood Loss:     Estimated blood loss: none. Procedure:                Pre-Anesthesia Assessment:                           - Prior to the procedure, a History and Physical                            was performed, and patient medications and                            allergies were reviewed. The patient's tolerance of                            previous anesthesia was also reviewed. The risks                            and benefits of the procedure and the sedation                            options and risks were discussed with the patient.                            All questions were answered, and informed consent                            was obtained. Prior Anticoagulants: The patient has                            taken Coumadin (warfarin), last dose was 3 days                            prior to procedure. ASA Grade Assessment: III - A  patient with severe systemic disease. After                            reviewing the risks and benefits, the patient was                            deemed in satisfactory condition to undergo the                            procedure.                           After obtaining informed consent, the colonoscope                            was passed under direct vision. Throughout the              procedure, the patient's blood pressure, pulse, and                            oxygen saturations were monitored continuously. The                            PCF-H190DL ([id number]) Olympus pediatric colonoscope                            was introduced through the anus and advanced to the                            the cecum, identified by appendiceal orifice and                            ileocecal valve. The colonoscopy was performed                            without difficulty. The patient tolerated the                            procedure well. The quality of the bowel                            preparation was adequate to identify polyps 6 mm                            and larger in size. Scope In: 10:13:37 AM Scope Out: 10:46:25 AM Scope Withdrawal Time: 0 hours 23 minutes 58 seconds  Total Procedure Duration: 0 hours 32 minutes 48 seconds  Findings:      The perianal and digital rectal examinations were normal.      A single small localized angioectasia without bleeding was found in the       cecum. Coagulation for tissue destruction using argon plasma at 0.5       liters/minute and 20 watts was successful. For hemostasis, two       hemostatic clips were successfully placed (MR conditional). There was no  bleeding at the end of the procedure.      A 4 mm polyp was found in the ascending colon. The polyp was sessile.       The polyp was removed with a cold biopsy forceps. Resection and       retrieval were complete.      Two sessile polyps were found in the transverse colon. The polyps were 3       to 4 mm in size. These polyps were removed with a cold biopsy forceps.       Resection and retrieval were complete.      Multiple hyperplastic and sessile polyps were found in the rectum. The       polyps were 3 to 7 mm in size. Few of these polyps were removed with a       hot snare. Resection and retrieval were complete.      Scattered small and large-mouthed  diverticula were found in the sigmoid       colon, descending colon, transverse colon, hepatic flexure and ascending       colon.      The exam was otherwise without abnormality on direct and retroflexion       views.      Small amount of residual, old blood noted in most of left colon.      It was difficult to insufflate the rectum and sigmoid despite manually       trying to hold pressure by closing the rectum with hands.      Edematous folds were noted throughout the colon. Impression:               - A single non-bleeding colonic angioectasia.                            Treated with argon plasma coagulation (APC). Clips                            (MR conditional) were placed.                           - One 4 mm polyp in the ascending colon, removed                            with a cold biopsy forceps. Resected and retrieved.                           - Two 3 to 4 mm polyps in the transverse colon,                            removed with a cold biopsy forceps. Resected and                            retrieved.                           - Multiple 3 to 7 mm polyps in the rectum, removed                            with a hot snare. Resected and  retrieved.                           - Diverticulosis in the sigmoid colon, in the                            descending colon, in the transverse colon, at the                            hepatic flexure and in the ascending colon.                           - The examination was otherwise normal on direct                            and retroflexion views.                           It was difficult to insufflate the rectum and                            sigmoid despite manually trying to hold pressure by                            closing the rectum with hands.                           Edematous folds were noted throughout the colon. Moderate Sedation:      Patient did not receive moderate sedation for this procedure, but       instead received  monitored anesthesia care. Recommendation:           - Resume regular diet.                           - Patient was in normal sinus rhythm, we need to                            address if she really needs long term                            anticoagulation.                           - If yes, recommend it would be ok to restart                            coumadin in 2 days.                           - There is an increased risk of recurrence of                            bleeding as she has diverticulosis and hematochezia  was likely related to diverticular disease. Procedure Code(s):        --- Professional ---                           [phone], Colonoscopy, flexible; with ablation of                            tumor(s), polyp(s), or other lesion(s) (includes                            pre- and post-dilation and guide wire passage, when                            performed)                           45385, 59, Colonoscopy, flexible; with removal of                            tumor(s), polyp(s), or other lesion(s) by snare                            technique                           45380, 62, Colonoscopy, flexible; with biopsy,                            single or multiple Diagnosis Code(s):        --- Professional ---                           K55.20, Angiodysplasia of colon without hemorrhage                           K63.5, Polyp of colon                           K62.1, Rectal polyp                           K62.5, Hemorrhage of anus and rectum                           K57.30, Diverticulosis of large intestine without                            perforation or abscess without bleeding CPT copyright 2019 [employer]. All rights reserved. The codes documented in this report are preliminary and upon coder review may  be revised to meet current compliance requirements. [name], MD 09/06/2019 11:01:33 AM This report has been signed  electronically. Number of Addenda: 0

## 2019-09-06 NOTE — Interval H&P Note (Signed)
History and Physical Interval Note: 69/female was on coumadin(last dose on 09/03/2019), with painless hematochezia for an EGD and colonoscopy.  09/06/2019 9:26 AM  Jocelyn Hill  has presented today for EGD and colonoscopy, with the diagnosis of Hematochezia and anemia.  The various methods of treatment have been discussed with the patient and family. After consideration of risks, benefits and other options for treatment, the patient has consented to  Procedure(s): ESOPHAGOGASTRODUODENOSCOPY (EGD) WITH PROPOFOL (N/A) COLONOSCOPY WITH PROPOFOL (N/A) as a surgical intervention.  The patient's history has been reviewed, patient examined, no change in status, stable for surgery.  I have reviewed the patient's chart and labs.  Questions were answered to the patient's satisfaction.     [name]

## 2019-09-06 NOTE — Plan of Care (Signed)
  Problem: Activity: Goal: Risk for activity intolerance will decrease Outcome: Progressing   Problem: Coping: Goal: Level of anxiety will decrease Outcome: Progressing   Problem: Safety: Goal: Ability to remain free from injury will improve Outcome: Progressing   

## 2019-09-06 NOTE — Evaluation (Signed)
Physical Therapy Evaluation Patient Details Name: Jocelyn Hill MRN: [id number] DOB: 03-04-1951 Today's Date: 09/06/2019   History of Present Illness  Pt is a 69 y/o female admitted secondary to GI bleed. Pt is s/p EGD and colonoscopy that revealed colonic AVM that was treated and had polyps removed. PMH includes HTN, a fib, ESRD on HD, CAD, and CHF.   Clinical Impression  Pt admitted secondary to problem above with deficits below. Pt requiring min guard to occasional min A for steadying during gait. Pt fatiguing easily, which limited distance. Educated about using rollator at home to increase safety with mobility. Will continue to follow acutely to maximize functional mobility independence and safety.     Follow Up Recommendations No PT follow up    Equipment Recommendations  Other (comment)(4 wheeled walker with seat (rollator))    Recommendations for Other Services       Precautions / Restrictions Precautions Precautions: Fall Restrictions Weight Bearing Restrictions: No      Mobility  Bed Mobility Overal bed mobility: Needs Assistance Bed Mobility: Sit to Supine       Sit to supine: Supervision   General bed mobility comments: Supervision for safety.   Transfers Overall transfer level: Needs assistance Equipment used: None Transfers: Sit to/from Stand Sit to Stand: Supervision         General transfer comment: Supervision for safety.   Ambulation/Gait Ambulation/Gait assistance: Min guard;Min assist Gait Distance (Feet): 75 Feet Assistive device: None Gait Pattern/deviations: Step-through pattern;Decreased stride length;Drifts right/left Gait velocity: Decreased   General Gait Details: Slow, mildly unsteady gait. Occasionally requiring min A for steadying. Pt fatiguing easily, which limited gait distance.   Stairs            Wheelchair Mobility    Modified Rankin (Stroke Patients Only)       Balance Overall balance assessment: Mild deficits  observed, not formally tested                                           Pertinent Vitals/Pain Pain Assessment: No/denies pain    Home Living Family/patient expects to be discharged to:: Private residence Living Arrangements: Children Available Help at Discharge: Family Type of Home: House Home Access: Level entry     Home Layout: One level Home Equipment: Bedside commode      Prior Function Level of Independence: Independent         Comments: Reports she fatigues easily      Hand Dominance        Extremity/Trunk Assessment   Upper Extremity Assessment Upper Extremity Assessment: Generalized weakness    Lower Extremity Assessment Lower Extremity Assessment: Generalized weakness    Cervical / Trunk Assessment Cervical / Trunk Assessment: Normal  Communication   Communication: No difficulties  Cognition Arousal/Alertness: Awake/alert Behavior During Therapy: WFL for tasks assessed/performed Overall Cognitive Status: Within Functional Limits for tasks assessed                                        General Comments      Exercises     Assessment/Plan    PT Assessment Patient needs continued PT services  PT Problem List Decreased strength;Decreased balance;Decreased activity tolerance;Decreased mobility       PT Treatment Interventions Gait training;Functional mobility training;Therapeutic activities;DME  instruction;Therapeutic exercise;Balance training;Patient/family education    PT Goals (Current goals can be found in the Care Plan section)  Acute Rehab PT Goals Patient Stated Goal: to go home PT Goal Formulation: With patient Time For Goal Achievement: 09/20/19 Potential to Achieve Goals: Good    Frequency Min 3X/week   Barriers to discharge        Co-evaluation               AM-PAC PT "6 Clicks" Mobility  Outcome Measure Help needed turning from your back to your side while in a flat bed without  using bedrails?: None Help needed moving from lying on your back to sitting on the side of a flat bed without using bedrails?: None Help needed moving to and from a bed to a chair (including a wheelchair)?: A Little Help needed standing up from a chair using your arms (e.g., wheelchair or bedside chair)?: None Help needed to walk in hospital room?: A Little Help needed climbing 3-5 steps with a railing? : A Little 6 Click Score: 21    End of Session Equipment Utilized During Treatment: Gait belt Activity Tolerance: Patient limited by fatigue Patient left: in bed;with call bell/phone within reach Nurse Communication: Mobility status(pt needs DME ) PT Visit Diagnosis: Unsteadiness on feet (R26.81);Muscle weakness (generalized) (M62.81)    Time: 1540-1550 PT Time Calculation (min) (ACUTE ONLY): 10 min   Charges:   PT Evaluation $PT Eval Low Complexity: 1 Low          Lou Miner, DPT  Acute Rehabilitation Services  Pager: [phone] Office: [phone]   Jocelyn Hill 09/06/2019, 6:06 PM

## 2019-09-06 NOTE — Anesthesia Postprocedure Evaluation (Signed)
Anesthesia Post Note  Patient: Jocelyn Hill  Procedure(s) Performed: ESOPHAGOGASTRODUODENOSCOPY (EGD) WITH PROPOFOL (N/A ) COLONOSCOPY WITH PROPOFOL (N/A ) POLYPECTOMY HOT HEMOSTASIS (ARGON PLASMA COAGULATION/BICAP) (N/A )     Patient location during evaluation: Endoscopy Anesthesia Type: MAC Level of consciousness: awake and alert Pain management: pain level controlled Vital Signs Assessment: post-procedure vital signs reviewed and stable Respiratory status: spontaneous breathing, nonlabored ventilation and respiratory function stable Cardiovascular status: blood pressure returned to baseline and stable Postop Assessment: no apparent nausea or vomiting Anesthetic complications: no    Last Vitals:  Vitals:   09/06/19 1115 09/06/19 1201  BP: (!) 161/77 (!) 175/85  Pulse: 75 88  Resp: 14   Temp:  36.9 C  SpO2: 100% 100%    Last Pain:  Vitals:   09/06/19 1201  TempSrc: Oral  PainSc: 0-No pain                 Lidia Collum

## 2019-09-06 NOTE — Plan of Care (Signed)
  Problem: Clinical Measurements: Goal: Respiratory complications will improve Outcome: Progressing   

## 2019-09-06 NOTE — Transfer of Care (Signed)
Immediate Anesthesia Transfer of Care Note  Patient: Jocelyn Hill  Procedure(s) Performed: ESOPHAGOGASTRODUODENOSCOPY (EGD) WITH PROPOFOL (N/A ) COLONOSCOPY WITH PROPOFOL (N/A )  Patient Location: Endoscopy Unit  Anesthesia Type:MAC  Level of Consciousness: awake, alert , oriented and patient cooperative  Airway & Oxygen Therapy: Patient Spontanous Breathing and Patient connected to nasal cannula oxygen  Post-op Assessment: Report given to RN, Post -op Vital signs reviewed and stable and Patient moving all extremities  Post vital signs: Reviewed and stable  Last Vitals:  Vitals Value Taken Time  BP 156/70 09/06/19 1056  Temp    Pulse 54 09/06/19 1055  Resp 19 09/06/19 1057  SpO2 94 % 09/06/19 1055  Vitals shown include unvalidated device data.  Last Pain:  Vitals:   09/06/19 0923  TempSrc: Temporal  PainSc: 8          Complications: No apparent anesthesia complications

## 2019-09-06 NOTE — Progress Notes (Signed)
[facility] Progress Note   Subjective:  2.6 L off on HD yest, EGD showed colon AVM x 1 which was rx'd , and polyps removed, EGD was negative.   Objective Vitals:   09/06/19 1104 09/06/19 1105 09/06/19 1115 09/06/19 1201  BP:  (!) 180/82 (!) 161/77 (!) 175/85  Pulse:  72 75 88  Resp:  20 14   Temp:    98.4 F (36.9 C)  TempSrc:    Oral  SpO2: 99% 100% 100% 100%  Weight:      Height:       Physical Exam General: Well developed, well nourished female in NAD Heart: RRR, no murmurs, rubs or gallops Lungs: + crackles LLL. No wheezing ausculted. Respirations unlabored, O2 nasal cannula Abdomen: Soft, non-distended, +BS. mildly tender b/l upper quadrants.  Extremities: No edema b/l lower extremities Dialysis Access:  R thigh TDC accessed, maturing RUE AVF + bruit  Home meds:   - warfarin as dict  - hydralazine 100 tid/ norvasc 10 qd/ carvedilol 6.25 bid/ cardura 4am+8pm  - isosorbide mono 120 qd/ sl ntg prn/ asa 81 qd/ atrovastatin 80  - protonix qd/ synthroid 100/ auryxia 210mg  ac tid  - remeron 15/ trazodone 100 / neurontin 300 mwf/ buspar 15 bid/ xanax prn  - montelukast 10  - prn's/ vitamins/ supplements     Dialysis:  [facility] MWF  3.5h   400/800  53kg  2/2.25 bath  R thigh TDC/ maturing R AVF Hep none Venofer 100 mg IV q HD x 5 doses- received 2/5, last tsat 28% Mircera 50 mcg IV q 2 weeks- last dose 2/24 Calcitriol 0.5 mcg PO TIW  Assessment/Plan: 1. Acute GI bleed: Hgb 7.6, INR elevated on presentation. Now sp 2u prbcs w/ Hgb 9's today. GI consulted and recommended PPI, reversal of coumadin, miralax. Colonoscopy today 3/9 showed AVM x 1 which was rx'd, and polyps which were removed. EGD was negative.  2.  ESRD:  MWF HD.  HD tomorrow.  3.  Hypertension/volume: pt was vol overloaded but SOB resolved and exam euvolemic today. 2kg up still. 4.  Anemia due to ABL/ ckd: Due for ESA on 3/10, ordered aranesp. Hb up after 2up prbc as above  5.  Metabolic bone  disease: Corrected calcium at goal, phos pending. Patient is on clear liquid diet, hold binders for now. 6.  Nutrition: eating now, renal diet 7. Paroxysmal a. Fib: INR up 3.7  on admission, down now to 1.2.  8. CHF: EF 30-35%, volume management with HD as above.       Additional Objective Labs: Basic Metabolic Panel: Recent Labs  Lab 09/04/19 0306 09/05/19 0439  NA 140 137  K 4.1 4.7  CL 101 105  CO2 27 22  GLUCOSE 175* 137*  BUN 67* 64*  CREATININE 5.41* 4.83*  CALCIUM 8.7* 8.8*   Liver Function Tests: Recent Labs  Lab 09/04/19 0306  AST 18  ALT 15  ALKPHOS 70  BILITOT 0.8  PROT 5.8*  ALBUMIN 3.4*   CBC: Recent Labs  Lab 09/04/19 0306 09/04/19 1052 09/04/19 1943 09/04/19 1943 09/05/19 0439 09/05/19 2227 09/06/19 0444  WBC 6.6  --  6.9  --  6.3  --   --   NEUTROABS  --   --   --   --  5.0  --   --   HGB 7.6*   < > 7.9*   < > 7.3* 9.8* 9.9*  HCT 23.2*   < > 23.5*   < >  21.8* 27.6* 28.7*  MCV 88.5  --  86.4  --  86.5  --   --   PLT 151  --  144*  --  148*  --   --    < > = values in this interval not displayed.   Blood Culture    Component Value Date/Time   SDES WOUND LEFT UPPER ARM 04/13/2019 1205   SPECREQUEST PERIGRAFT SAMPLE A PT ON VANC 04/13/2019 1205   CULT  04/13/2019 1205    MODERATE STAPHYLOCOCCUS AUREUS NO ANAEROBES ISOLATED Performed at [facility], [place] [address], [place], [place] [address]    REPTSTATUS 04/18/2019 FINAL 04/13/2019 1205    Medications: . sodium chloride 600 mL/hr at 09/06/19 1049   . amLODipine  10 mg Oral QHS  . atorvastatin  80 mg Oral QHS  . busPIRone  15 mg Oral BID  . calcitRIOL  0.5 mcg Oral Q M,W,F-HD  . carvedilol  6.25 mg Oral BID WC  . Chlorhexidine Gluconate Cloth  6 each Topical Q0600  . cholecalciferol  1,000 Units Oral QHS  . [START ON 09/07/2019] darbepoetin (ARANESP) injection - DIALYSIS  60 mcg Intravenous Q Wed-HD  . doxazosin  4 mg Oral Daily  . doxazosin  8 mg Oral QHS  . feeding  supplement (NEPRO CARB STEADY)  237 mL Oral BID BM  . ferric citrate  210 mg Oral Daily  . fluticasone  2 spray Each Nare Daily  . [START ON 09/07/2019] gabapentin  300 mg Oral Q M,W,F  . guaiFENesin  600 mg Oral BID  . hydrALAZINE  100 mg Oral TID  . isosorbide mononitrate  120 mg Oral QHS  . levothyroxine  100 mcg Oral QAC breakfast  . mirtazapine  15 mg Oral QHS  . montelukast  10 mg Oral QHS  . multivitamin  1 tablet Oral QHS  . [START ON 09/07/2019] pantoprazole  40 mg Oral QAC breakfast  . pantoprazole  40 mg Intravenous q morning - 10a  . pneumococcal 23 valent vaccine  0.5 mL Intramuscular Tomorrow-1000  . polyethylene glycol  17 g Oral BID  . sodium chloride flush  3 mL Intravenous Q12H  . sodium chloride flush  3 mL Intravenous Q12H  . traZODone  100 mg Oral QHS

## 2019-09-06 NOTE — Progress Notes (Signed)
Pt states she had small bloody BM this evening. Not observed by this RN. Pt instructed not to flush next BM. Collection container placed in toilet.  Pt requests alternative PRN pain med to Fentanyl. Pt states Fentanyl gives her a headache. MD paged.

## 2019-09-06 NOTE — Op Note (Signed)
EGD findings: Normal esophagus Normal stomach, cardia, fundus, body, antrum and pylorus. Normal duodenal bulb and duodenum. No evidence of upper GI bleeding.   Colonoscopy findings: Scattered diverticulosis. Old residual blood noted in left colon. No active bleeding on thorough lavage. 1 small AVM noted in cecum, status post APC and 2 Endo Clip placement. 1 small polyp in ascending, removed with cold biopsy forceps. 2 small polyps in transverse, removed with cold biopsy forceps. Multiple hyperplastic appearing polyps noted in the rectum, few of them removed with hot snare.  Multiple folds of colon appeared adenomatous. It was difficult to insufflate the rectum despite manually applying pressure in the rectal opening. No hemorrhoids noted.   Recommendation: Resume renal diet Patient was in normal sinus rhythm throughout the procedure. Ideally if possible, avoid antiplatelets and anticoagulation, as patient remains at risk of rebleeding. She was noted to have AVM and also diverticulosis. Otherwise if Coumadin needs to be resumed, okay to restart in 2 days.  We will sign off. Please recall GI as needed.  [name], MD

## 2019-09-06 NOTE — Progress Notes (Addendum)
PROGRESS NOTE    Jocelyn Hill  ATF:[id number]  DOB: January 14, 1951  PCP: [name], MD Admit date:09/04/2019  69 y/o F with h/o CAD, PAF and prosthetic heart valve,on chronic anticoagulation with coumadin,  ESRD on HD, chronic combined CHF, hypothyroidism, hypertension, chronic pain, presented for evaluation blood clots per rectum and associated abdominal pain, generalized, cramping in character, and without any alleviating or exacerbating factors. She denies any alcohol or NSAID use, and reports that she takes Protonix daily. She underwent colonoscopy "years ago,"believes that aside from hemorrhoids, there were no abnormal findings and reports that she was due for repeat screening colonoscopy later this month. ED Course: Afebrile, hemodynamically stable. Chemistry panel is notable for BUN of 67 CBC features a hemoglobin of 7.6, down from 11.2 in January. INR is elevated to 3.7.  1 unit PRBC transfusion initiated, GI was consulted by the EDP who recommended warfarin reversal and hospitalist admission. Hospital course: Patient admitted to [facility] with PPI drip. Nephrology consulted for dialysis. She missed her dialysis on Friday due to not feeling good.   Subjective:  Patient underwent colonoscopy this morning and now eating regular diet.  Posttransfusion hemoglobin stable at 9.8 this morning.  Objective: Vitals:   09/06/19 1104 09/06/19 1105 09/06/19 1115 09/06/19 1201  BP:  (!) 180/82 (!) 161/77 (!) 175/85  Pulse:  72 75 88  Resp:  20 14   Temp:    98.4 F (36.9 C)  TempSrc:    Oral  SpO2: 99% 100% 100% 100%  Weight:      Height:        Intake/Output Summary (Last 24 hours) at 09/06/2019 1541 Last data filed at 09/06/2019 1049 Gross per 24 hour  Intake 1030 ml  Output 5 ml  Net 1025 ml   Filed Weights   09/05/19 0701 09/05/19 1029 09/06/19 0923  Weight: 58 kg 55.1 kg 55.1 kg    Physical Examination:  General exam: Appears calm and comfortable  Respiratory system: Clear to  auscultation. Respiratory effort normal. Cardiovascular system: S1 & S2 heard, RRR. No JVD, murmurs, rubs, gallops or clicks. No pedal edema. Gastrointestinal system: Abdomen is nondistended, soft and nontender. Normal bowel sounds heard. Central nervous system: Alert and oriented. No new focal neurological deficits. Extremities: No contractures, edema or joint deformities.  Skin: No rashes, lesions or ulcers Psychiatry: Judgement and insight appear normal. Mood & affect appropriate.   Data Reviewed: I have personally reviewed following labs and imaging studies  CBC: Recent Labs  Lab 09/04/19 0306 09/04/19 0306 09/04/19 1052 09/04/19 1943 09/05/19 0439 09/05/19 2227 09/06/19 0444  WBC 6.6  --   --  6.9 6.3  --   --   NEUTROABS  --   --   --   --  5.0  --   --   HGB 7.6*   < > 8.8* 7.9* 7.3* 9.8* 9.9*  HCT 23.2*   < > 25.9* 23.5* 21.8* 27.6* 28.7*  MCV 88.5  --   --  86.4 86.5  --   --   PLT 151  --   --  144* 148*  --   --    < > = values in this interval not displayed.   Basic Metabolic Panel: Recent Labs  Lab 09/04/19 0306 09/05/19 0439  NA 140 137  K 4.1 4.7  CL 101 105  CO2 27 22  GLUCOSE 175* 137*  BUN 67* 64*  CREATININE 5.41* 4.83*  CALCIUM 8.7* 8.8*   GFR: Estimated  Creatinine Clearance: 9.7 mL/min (A) (by C-G formula based on SCr of 4.83 mg/dL (H)). Liver Function Tests: Recent Labs  Lab 09/04/19 0306  AST 18  ALT 15  ALKPHOS 70  BILITOT 0.8  PROT 5.8*  ALBUMIN 3.4*   No results for input(s): LIPASE, AMYLASE in the last 168 hours. No results for input(s): AMMONIA in the last 168 hours. Coagulation Profile: Recent Labs  Lab 09/04/19 0346 09/05/19 0439 09/06/19 0444  INR 3.7* 1.4* 1.2   Cardiac Enzymes: No results for input(s): CKTOTAL, CKMB, CKMBINDEX, TROPONINI in the last 168 hours. BNP (last 3 results) No results for input(s): PROBNP in the last 8760 hours. HbA1C: No results for input(s): HGBA1C in the last 72 hours. CBG: No results  for input(s): GLUCAP in the last 168 hours. Lipid Profile: No results for input(s): CHOL, HDL, LDLCALC, TRIG, CHOLHDL, LDLDIRECT in the last 72 hours. Thyroid Function Tests: No results for input(s): TSH, T4TOTAL, FREET4, T3FREE, THYROIDAB in the last 72 hours. Anemia Panel: No results for input(s): VITAMINB12, FOLATE, FERRITIN, TIBC, IRON, RETICCTPCT in the last 72 hours. Sepsis Labs: No results for input(s): PROCALCITON, LATICACIDVEN in the last 168 hours.  Recent Results (from the past 240 hour(s))  SARS CORONAVIRUS 2 (TAT 6-24 HRS) Nasopharyngeal Nasopharyngeal Swab     Status: None   Collection Time: 09/04/19  4:37 AM   Specimen: Nasopharyngeal Swab  Result Value Ref Range Status   SARS Coronavirus 2 NEGATIVE NEGATIVE Final    Comment: (NOTE) SARS-CoV-2 target nucleic acids are NOT DETECTED. The SARS-CoV-2 RNA is generally detectable in upper and lower respiratory specimens during the acute phase of infection. Negative results do not preclude SARS-CoV-2 infection, do not rule out co-infections with other pathogens, and should not be used as the sole basis for treatment or other patient management decisions. Negative results must be combined with clinical observations, patient history, and epidemiological information. The expected result is Negative. Fact Sheet for Patients: SugarRoll.be Fact Sheet for Healthcare Providers: https://www.woods-mathews.com/ This test is not yet approved or cleared by the [place] FDA and  has been authorized for detection and/or diagnosis of SARS-CoV-2 by FDA under an Emergency Use Authorization (EUA). This EUA will remain  in effect (meaning this test can be used) for the duration of the COVID-19 declaration under Section 56 4(b)(1) of the Act, 21 U.S.C. section 360bbb-3(b)(1), unless the authorization is terminated or revoked sooner. Performed at [facility], [place] [address], [place],  [place] [id number]       Radiology Studies: CT ABDOMEN PELVIS WO CONTRAST  Result Date: 09/04/2019 CLINICAL DATA:  Abdominal pain.  GI bleed. EXAM: CT ABDOMEN AND PELVIS WITHOUT CONTRAST TECHNIQUE: Multidetector CT imaging of the abdomen and pelvis was performed following the standard protocol without IV contrast. COMPARISON:  03/26/2017 FINDINGS: Lower chest: There is diffuse bronchial wall thickening at the right lung base. There is interlobular septal thickening at the lung bases bilaterally, right worse than left. There is a small right-sided effusion.There is cardiomegaly. The intracardiac blood pool is hypodense relative to the adjacent myocardium consistent with anemia. There is a right femoral approach dialysis catheter that extends into the right atrium. Hepatobiliary: The liver is normal. Cholelithiasis without acute inflammation. There is no biliary ductal dilation. Pancreas: Normal contours without ductal dilatation. No peripancreatic fluid collection. Spleen: No splenic laceration or hematoma. Adrenals/Urinary Tract: --Adrenal glands: No adrenal hemorrhage. --Right kidney/ureter: No hydronephrosis or perinephric hematoma. --Left kidney/ureter: The left kidney is atrophic. --Urinary bladder: Unremarkable. Stomach/Bowel: --Stomach/Duodenum:  No hiatal hernia or other gastric abnormality. Normal duodenal course and caliber. --Small bowel: No dilatation or inflammation. --Colon: There is scattered colonic diverticula without CT evidence for diverticulitis. There is a probable area peristalsis involving the ascending colon near the hepatic flexure (coronal series 6, image 54). --Appendix: Normal. Vascular/Lymphatic: Atherosclerotic calcification is present within the non-aneurysmal abdominal aorta, without hemodynamically significant stenosis. --No retroperitoneal lymphadenopathy. --No mesenteric lymphadenopathy. --No pelvic or inguinal lymphadenopathy. Reproductive: The patient is status post hysterectomy.  There is a cystic mass involving the left ovary measuring approximately 3.7 cm (axial series 3, image 71). Other: No ascites or free air. The abdominal wall is normal. Musculoskeletal. No acute displaced fractures. IMPRESSION: 1. Evaluation limited by lack of IV contrast. 2. No definite finding to explain the patient's GI bleeding. 3. There is cholelithiasis without secondary signs of acute cholecystitis. 4. Cardiomegaly with a small right-sided pleural effusion. 5. Bronchial wall thickening and mucus plugging at the lung bases which may indicate underlying infectious or inflammatory bronchiolitis. 6. There is a right femoral approach dialysis catheter in place that extends into the right atrium. 7. There is a 3.7 cm left ovarian cystic mass. A follow-up pelvic ultrasound is recommended in 6 months. 8.  Aortic Atherosclerosis (ICD10-I70.0). Electronically Signed   By: Constance Holster M.D.   On: 09/04/2019 23:11   DG CHEST PORT 1 VIEW  Result Date: 09/04/2019 CLINICAL DATA:  Cough EXAM: PORTABLE CHEST 1 VIEW COMPARISON:  04/11/2019 FINDINGS: There is a multi lead left-sided pacemaker in place. The heart size is enlarged. The patient is status post prior median sternotomy, valve replacement, and atrial appendage clip placement. There is no pneumothorax. There is vascular congestion with early developing pulmonary edema. There is no large pleural effusion. There is no acute osseous abnormality. No large focal infiltrate. Aortic calcifications are noted. IMPRESSION: Cardiomegaly with vascular congestion and early developing pulmonary edema. Electronically Signed   By: Constance Holster M.D.   On: 09/04/2019 19:10        Scheduled Meds: . amLODipine  10 mg Oral QHS  . atorvastatin  80 mg Oral QHS  . busPIRone  15 mg Oral BID  . calcitRIOL  0.5 mcg Oral Q M,W,F-HD  . carvedilol  6.25 mg Oral BID WC  . Chlorhexidine Gluconate Cloth  6 each Topical Q0600  . [START ON 09/07/2019] Chlorhexidine Gluconate  Cloth  6 each Topical Q0600  . cholecalciferol  1,000 Units Oral QHS  . [START ON 09/07/2019] darbepoetin (ARANESP) injection - DIALYSIS  60 mcg Intravenous Q Wed-HD  . doxazosin  4 mg Oral Daily  . doxazosin  8 mg Oral QHS  . feeding supplement (NEPRO CARB STEADY)  237 mL Oral BID BM  . ferric citrate  210 mg Oral Daily  . fluticasone  2 spray Each Nare Daily  . [START ON 09/07/2019] gabapentin  300 mg Oral Q M,W,F  . guaiFENesin  600 mg Oral BID  . hydrALAZINE  100 mg Oral TID  . isosorbide mononitrate  120 mg Oral QHS  . levothyroxine  100 mcg Oral QAC breakfast  . mirtazapine  15 mg Oral QHS  . montelukast  10 mg Oral QHS  . multivitamin  1 tablet Oral QHS  . [START ON 09/07/2019] pantoprazole  40 mg Oral QAC breakfast  . pneumococcal 23 valent vaccine  0.5 mL Intramuscular Tomorrow-1000  . polyethylene glycol  17 g Oral BID  . sodium chloride flush  3 mL Intravenous Q12H  . sodium chloride flush  3 mL Intravenous Q12H  . traZODone  100 mg Oral QHS   Continuous Infusions: . sodium chloride 600 mL/hr at 09/06/19 1049   Assessment/Plan:  1. LowerGI bleeding with associated acute blood loss anemia: Patient has  chronically dark stool that she attributes to iron supplementation, and found to have hgb of 7.6 on admission (down from 11.2 in January) in the setting of supratherapeutic INR 3.7. s/p 1 unit RBC, 5 mg IV vit K, and FFP in ED.olding warfarin and ASA, on IV PPI.  Received 1 unit PRBC for hemoglobin7.6-> improved to 8.8-> down trended again 7.9->7.3-> another unit of PRBC 3/8-->hemoglobin improved to 9.5.GI consult much appreciated-she underwent colonoscopy today 3/9-noted to have colon polyps/AVMs underwent polypectomy and APC.  Per GI okay to resume Coumadin in 2 days.  GI feels risk of recurrent GI bleed is moderate to high.  Recommended weighing in risks and benefits of resuming Coumadin.  Discussed with patient's primary cardiologist Dr. Marigene Ehlers who stated okay to discontinue  aspirin and resume Coumadin as advised by GI in 2 days without bridging.  They will follow up patient in their clinic in 2 weeks for further risk/benefit analysis.  Patient was planned for discharge this afternoon but reported another episode of maroon/burgundy colored stools.  Informed GI and discharge plan held.  Will monitor overnight for residual bleed versus post polypectomy bleeding with serial hemoglobins.  Resume home dose of PPI  2.Paroxysmal atrial fibrillation; supratherapeutic INRat 3.7 in ED and reversed due to #1.  Resumed Coreg  3.ESRD-BUN elevated at 67 in the setting of GIB. No indication for urgent HD, will likely need maintenance dialysis during this admission - She has failed LUE access, awaiting second stage of right basilic v transposition, and currently dialyzed through tunneled right femoral catheter-Renally-dose medications, Dr. Melvia Heaps following-next HD session on 3/10.  4.Chronic combined systolic and diastolic CHF, bioprosthetic aortic valveEF was 30-35% in 2019 with grade II diastolic dysfunction-She appears compensated, volume management per HD  5.Hypertension-SBP 140's on presentation.  Patient resumed on partial home regimen including Imdur and hydralazine in light of acute GIB. SBP up to 170s to 190s during the hospital course in the setting of ESRD and anxiety.  Resumed other home medications (amlodipine, Cardura and Coreg) at lower dose yesterday and blood pressure still elevated today.  Resumed at home doses for tonight.  6.CAD-No anginal complaints, ASA held  7.  Anxiety: Resumed home medication Xanax for as needed use  DVT prophylaxis: SCD, warfarin reversed Code Status: Full code Family / Patient Communication:  Discussed with patient and all questions answered to satisfaction. Disposition Plan:   Patient is home from prior to hospitalization. Received/Receiving inpatient care for acute GI bleed Discharge home in a.m. if GI bleed  resolves and hemoglobin/blood pressure stable.   LOS: 1 day    Time spent: 35 minutes    Guilford Shi, MD [facility] Pager in [place]  If 7PM-7AM, please contact night-coverage [email or web address] 09/06/2019, 3:41 PM

## 2019-09-06 NOTE — Anesthesia Preprocedure Evaluation (Addendum)
Anesthesia Evaluation  Patient identified by MRN, date of birth, ID band Patient awake    Reviewed: Allergy & Precautions, H&P , NPO status , Patient's Chart, lab work & pertinent test results  Airway Mallampati: II  TM Distance: >3 FB Neck ROM: full    Dental  (+) Poor Dentition   Pulmonary shortness of breath, former smoker,    Pulmonary exam normal        Cardiovascular hypertension, + CAD, + Past MI, + CABG and +CHF  Normal cardiovascular exam+ dysrhythmias Atrial Fibrillation + Valvular Problems/Murmurs   S/p MAZE for AF. S/p MVR CABG (2019)   Neuro/Psych  Headaches, PSYCHIATRIC DISORDERS Anxiety Depression CVA    GI/Hepatic GERD  ,  Endo/Other  diabetes, Type 2Hypothyroidism   Renal/GU ESRFRenal disease     Musculoskeletal  (+) Arthritis , Fibromyalgia -  Abdominal   Peds  Hematology   Anesthesia Other Findings   Reproductive/Obstetrics                             Anesthesia Physical  Anesthesia Plan  ASA: IV  Anesthesia Plan: MAC   Post-op Pain Management:    Induction: Intravenous  PONV Risk Score and Plan: 2 and Ondansetron, Propofol infusion, Treatment may vary due to age or medical condition and TIVA  Airway Management Planned: Nasal Cannula and Natural Airway  Additional Equipment: None  Intra-op Plan:   Post-operative Plan:   Informed Consent: I have reviewed the patients History and Physical, chart, labs and discussed the procedure including the risks, benefits and alternatives for the proposed anesthesia with the patient or authorized representative who has indicated his/her understanding and acceptance.       Plan Discussed with:   Anesthesia Plan Comments: (ESRD on HD MWF. S/p ligation of her left upper arm AV graft for steal syndrome 03/01/19 and subsequently required removal of infected graft on 04/13/2019.   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 She was last seen by cardiology 04/17/18. It appears he last remote transmission was 03/09/18, shortly after it was placed. Device was interrogated postoperatively after recent surgeries 03/01/19 and 04/14/19.    EKG 04/17/19: Atrial-sensed ventricular-paced rhythm. Rate 89. Biventricular pacemaker detected.  TTE 04/18/18: - Left ventricle: The cavity size was normal. There was severe   concentric hypertrophy. Systolic function was moderately to   severely reduced. The estimated ejection fraction was in the   range of 30% to 35%. Features are consistent with a pseudonormal   left ventricular filling pattern, with concomitant abnormal   relaxation and increased filling pressure (grade 2 diastolic   dysfunction). Doppler parameters are consistent with elevated   ventricular end-diastolic filling pressure. - Mitral valve: S/P MVR with a bioprosthetic valve. Mean gradient   (D): 5 mm Hg. Valve area by continuity equation (using LVOT   flow): 1.06 cm^2. - Right ventricle: Systolic function was normal. - Right atrium: The atrium was normal in size. - Pulmonic valve: There was mild regurgitation. - Pulmonary arteries: Systolic pressure was within the normal   range. - Pericardium, extracardiac: A trivial pericardial effusion was   identified posterior to the heart. Features were not consistent   with tamponade physiology. There was a large left pleural   effusion.  Impressions:  - LVEF is 30-35% with  diffuse hypokinesis and significant   paradoxical septal motion and interventricular dyssynchrony.   LVEDP is severely elevated with E/E&' =32. LVEF is unchanged from   the prior echocardiogram from 02/22/2018.)        Anesthesia Quick Evaluation

## 2019-09-06 NOTE — Op Note (Signed)
[facility] Patient Name: Jocelyn Hill Procedure Date : 09/06/2019 MRN: [id number] Attending MD: [name] , MD Date of Birth: Oct 30, 1950 CSN: [id number] Age: 69 Admit Type: Inpatient Procedure:                Upper GI endoscopy Indications:              Acute post hemorrhagic anemia, Hematochezia Providers:                [name], MD, Angus Seller, Janeece Agee,                            Technician Referring MD:             [facility] Medicines:                Monitored Anesthesia Care Complications:            No immediate complications. Estimated Blood Loss:     Estimated blood loss: none. Procedure:                Pre-Anesthesia Assessment:                           - Prior to the procedure, a History and Physical                            was performed, and patient medications and                            allergies were reviewed. The patient's tolerance of                            previous anesthesia was also reviewed. The risks                            and benefits of the procedure and the sedation                            options and risks were discussed with the patient.                            All questions were answered, and informed consent                            was obtained. Prior Anticoagulants: The patient has                            taken Coumadin (warfarin), last dose was 3 days                            prior to procedure. ASA Grade Assessment: III - A                            patient with severe systemic disease. After  reviewing the risks and benefits, the patient was                            deemed in satisfactory condition to undergo the                            procedure.                           After obtaining informed consent, the endoscope was                            passed under direct vision. Throughout the                            procedure, the patient's blood pressure,  pulse, and                            oxygen saturations were monitored continuously. The                            GIF-H190 ([id number] Olympus gastroscope was                            introduced through the mouth, and advanced to the                            second part of duodenum. The upper GI endoscopy was                            accomplished without difficulty. The patient                            tolerated the procedure well. Scope In: Scope Out: Findings:      The examined esophagus was normal.      The Z-line was regular and was found 36 cm from the incisors.      The entire examined stomach was normal.      The cardia and gastric fundus were normal on retroflexion.      The examined duodenum was normal. Impression:               - Normal esophagus.                           - Z-line regular, 36 cm from the incisors.                           - Normal stomach.                           - Normal examined duodenum.                           - No specimens collected. Moderate Sedation:      Patient did not receive moderate sedation for this procedure, but  instead received monitored anesthesia care. Recommendation:           - Perform a colonoscopy today. Procedure Code(s):        --- Professional ---                           [phone], Esophagogastroduodenoscopy, flexible,                            transoral; diagnostic, including collection of                            specimen(s) by brushing or washing, when performed                            (separate procedure) Diagnosis Code(s):        --- Professional ---                           D62, Acute posthemorrhagic anemia                           K92.1, Melena (includes Hematochezia) CPT copyright 2019 [employer]. All rights reserved. The codes documented in this report are preliminary and upon coder review may  be revised to meet current compliance requirements. [name], MD 09/06/2019  10:54:03 AM This report has been signed electronically. Number of Addenda: 0

## 2019-09-06 NOTE — Progress Notes (Signed)
Initial Nutrition Assessment  DOCUMENTATION CODES:   Non-severe (moderate) malnutrition in context of chronic illness  INTERVENTION:   -Continue renal MVI daily -Nepro Shake po BID, each supplement provides 425 kcal and 19 grams protein  NUTRITION DIAGNOSIS:   Moderate Malnutrition related to chronic illness(ESRD on HD) as evidenced by energy intake < or equal to 75% for > or equal to 1 month, mild fat depletion, moderate fat depletion, mild muscle depletion, moderate muscle depletion.  GOAL:   Patient will meet greater than or equal to 90% of their needs  MONITOR:   PO intake, Supplement acceptance, Weight trends, Labs, Skin, I & O's  REASON FOR ASSESSMENT:   Other (Comment)    ASSESSMENT:   Jocelyn Hill is a 69 y.o. female with medical history significant for ESRD on hemodialysis, PAF on warfarin, bioprosthetic mitral valve, coronary artery disease, chronic combined CHF, hypothyroidism, hypertension, chronic pain, and presented to the emergency department for evaluation blood clots per rectum.  Patient reports that her stool is always dark due to medications but then began to pass clots per rectum yesterday.  This has continued today without any vomiting, lightheadedness, or chest pain.  She has had some abdominal pain associated with this, generalized, cramping in character, and without any alleviating or exacerbating factors identified.  She denies any alcohol use, denies NSAID use, and reports that she takes Protonix daily.  She had a colonoscopy "years ago," reports that she was due for repeat screening colonoscopy later this month, and believes that aside from hemorrhoids, there were no abnormal findings on her developed colonoscopy to her knowledge.  Pt admitted with GIB.   Reviewed I/O's: -893 ml x 24 hours and +957 ml since admission  Spoke with pt at bedside, who complains of her stomach hurting. She is looking at menu to look at lunch options. She recently returned  from her colonoscopy.   Pt shares that she has been trying to work on oral intake since she last was discharged from the hospital, as she has lost a lot of weight during that hospitalization. Her daughter has been preparing her meals. She reports she generally weighs around 120-125#, but unsure of dry wt (53 kg per nephrology notes). Pt reports taking Novasource renal supplements at home.   Discussed with pt importance of good meal and supplement intake to promote healing.  Medications reviewed ad include miralax and remeron.   Labs reviewed.   NUTRITION - FOCUSED PHYSICAL EXAM:    Most Recent Value  Orbital Region  Moderate depletion  Upper Arm Region  Moderate depletion  Thoracic and Lumbar Region  Mild depletion  Buccal Region  Moderate depletion  Temple Region  Moderate depletion  Clavicle Bone Region  Moderate depletion  Clavicle and Acromion Bone Region  Moderate depletion  Scapular Bone Region  Moderate depletion  Dorsal Hand  Moderate depletion  Patellar Region  Moderate depletion  Anterior Thigh Region  Moderate depletion  Posterior Calf Region  Moderate depletion  Edema (RD Assessment)  Mild  Hair  Reviewed  Eyes  Reviewed  Mouth  Reviewed  Skin  Reviewed  Nails  Reviewed       Diet Order:   Diet Order            Diet renal with fluid restriction Fluid restriction: 1200 mL Fluid; Room service appropriate? Yes; Fluid consistency: Thin  Diet effective now              EDUCATION NEEDS:   Education needs  have been addressed  Skin:  Skin Assessment: Reviewed RN Assessment  Last BM:  09/05/19  Height:   Ht Readings from Last 1 Encounters:  09/06/19 5\' 9"  (1.753 m)    Weight:   Wt Readings from Last 1 Encounters:  09/06/19 55.1 kg    Ideal Body Weight:  65.9 kg  BMI:  Body mass index is 17.94 kg/m.  Estimated Nutritional Needs:   Kcal:  1700-1900  Protein:  95-110 grams  Fluid:  1.2 L    Loistine Chance, RD, LDN, [place] Registered Dietitian  II Certified Diabetes Care and Education Specialist Please refer to [facility] for RD and/or RD on-call/weekend/after hours pager

## 2019-09-06 NOTE — Brief Op Note (Signed)
09/04/2019 - 09/06/2019  11:02 AM  PATIENT:  [name]  69 y.o. female  PRE-OPERATIVE DIAGNOSIS:  Hematochezia and anemia  POST-OPERATIVE DIAGNOSIS:  EGD: mild gastritis Colon: AVM cecum-APC used and 2 clips placed in cecum; polyps removed in ascending, transverse colon  PROCEDURE:  Procedure(s): ESOPHAGOGASTRODUODENOSCOPY (EGD) WITH PROPOFOL (N/A) COLONOSCOPY WITH PROPOFOL (N/A)  SURGEON:  Surgeon(s) and Role:    [name], MD - Primary  PHYSICIAN ASSISTANT:   ASSISTANTS: Angus Seller, RN, Duy Nguyen,Tech  ANESTHESIA:   MAC  EBL: Minimal  BLOOD ADMINISTERED:none  DRAINS: none   LOCAL MEDICATIONS USED:  NONE  SPECIMEN:  Biopsy / Limited Resection  DISPOSITION OF SPECIMEN:  PATHOLOGY  COUNTS:  YES  TOURNIQUET:  * No tourniquets in log *  DICTATION: .Dragon Dictation  PLAN OF CARE: Admit to inpatient   PATIENT DISPOSITION:  PACU - hemodynamically stable.   Delay start of Pharmacological VTE agent (>24hrs) due to surgical blood loss or risk of bleeding: yes

## 2019-09-07 ENCOUNTER — Encounter: Payer: Self-pay | Admitting: *Deleted

## 2019-09-07 LAB — CBC
HCT: 24.8 % — ABNORMAL LOW (ref 36.0–46.0)
Hemoglobin: 8.3 g/dL — ABNORMAL LOW (ref 12.0–15.0)
MCH: 29.7 pg (ref 26.0–34.0)
MCHC: 33.5 g/dL (ref 30.0–36.0)
MCV: 88.9 fL (ref 80.0–100.0)
Platelets: 146 10*3/uL — ABNORMAL LOW (ref 150–400)
RBC: 2.79 MIL/uL — ABNORMAL LOW (ref 3.87–5.11)
RDW: 13.7 % (ref 11.5–15.5)
WBC: 6.4 10*3/uL (ref 4.0–10.5)
nRBC: 0 % (ref 0.0–0.2)

## 2019-09-07 LAB — RENAL FUNCTION PANEL
Albumin: 3 g/dL — ABNORMAL LOW (ref 3.5–5.0)
Anion gap: 9 (ref 5–15)
BUN: 27 mg/dL — ABNORMAL HIGH (ref 8–23)
CO2: 23 mmol/L (ref 22–32)
Calcium: 9 mg/dL (ref 8.9–10.3)
Chloride: 107 mmol/L (ref 98–111)
Creatinine, Ser: 4.37 mg/dL — ABNORMAL HIGH (ref 0.44–1.00)
GFR calc Af Amer: 11 mL/min — ABNORMAL LOW (ref >60)
GFR calc non Af Amer: 10 mL/min — ABNORMAL LOW (ref >60)
Glucose, Bld: 109 mg/dL — ABNORMAL HIGH (ref 70–99)
Phosphorus: 4 mg/dL (ref 2.5–4.6)
Potassium: 4.6 mmol/L (ref 3.5–5.1)
Sodium: 139 mmol/L (ref 135–145)

## 2019-09-07 LAB — PROTIME-INR
INR: 1.2 (ref 0.8–1.2)
Prothrombin Time: 14.6 seconds (ref 11.4–15.2)

## 2019-09-07 LAB — HEMOGLOBIN AND HEMATOCRIT, BLOOD
HCT: 25.3 % — ABNORMAL LOW (ref 36.0–46.0)
HCT: 27.5 % — ABNORMAL LOW (ref 36.0–46.0)
Hemoglobin: 8.7 g/dL — ABNORMAL LOW (ref 12.0–15.0)
Hemoglobin: 9.6 g/dL — ABNORMAL LOW (ref 12.0–15.0)

## 2019-09-07 LAB — SURGICAL PATHOLOGY

## 2019-09-07 MED ORDER — HEPARIN SODIUM (PORCINE) 1000 UNIT/ML IJ SOLN
INTRAMUSCULAR | Status: AC
  Filled 2019-09-07: qty 6

## 2019-09-07 MED ORDER — DARBEPOETIN ALFA 60 MCG/0.3ML IJ SOSY
PREFILLED_SYRINGE | INTRAMUSCULAR | Status: AC
  Administered 2019-09-07: 60 ug via INTRAVENOUS
  Filled 2019-09-07: qty 0.3

## 2019-09-07 MED ORDER — CALCITRIOL 0.5 MCG PO CAPS
ORAL_CAPSULE | ORAL | Status: AC
  Administered 2019-09-07: 0.5 ug via ORAL
  Filled 2019-09-07: qty 1

## 2019-09-07 NOTE — TOC Transition Note (Signed)
Transition of Care [facility]) - CM/SW Discharge Note   Patient Details  Name: MADOLIN TWADDLE MRN: [id number] Date of Birth: 1950-12-18  Transition of Care [facility]) CM/SW Contact:  Zenon Mayo, RN Phone Number: 09/07/2019, 3:03 PM   Clinical Narrative:    Patient will need rollator at discharge.  NCM made referral to [facility] with [employer] for rollator, he will have this delivered to patient's home.     Final next level of care: Home/Self Care Barriers to Discharge: No Barriers Identified   Patient Goals and CMS Choice        Discharge Placement                       Discharge Plan and Services                DME Arranged: Walker rolling with seat DME Agency: Celesta Aver) Date DME Agency Contacted: 09/07/19 Time DME Agency Contacted: [phone] Representative spoke with at DME Agency: Brenton Grills HH Arranged: NA          Social Determinants of Health ([place]) Interventions     Readmission Risk Interventions Readmission Risk Prevention Plan 04/20/2019  Transportation Screening Complete  Medication Review Press photographer) Complete  PCP or Specialist appointment within 3-5 days of discharge Complete  HRI or [place] Complete  SW Recovery Care/Counseling Consult Complete  [place] Not Applicable

## 2019-09-07 NOTE — TOC Initial Note (Signed)
Transition of Care [facility]) - Initial/Assessment Note    Patient Details  Name: Jocelyn Hill MRN: [id number] Date of Birth: Apr 08, 1951  Transition of Care [facility]) CM/SW Contact:    Zenon Mayo, RN Phone Number: 09/07/2019, 3:13 PM  Clinical Narrative:                 She lives with her daughter at address [address] [place] [address]. This address was given to [facility] with [employer], the rollator will be delivered to this address.  Patient states she has transportation at dc. She has no issues getting medications.  Expected Discharge Plan: Home/Self Care Barriers to Discharge: Continued Medical Work up   Patient Goals and CMS Choice Patient states their goals for this hospitalization and ongoing recovery are:: to get better      Expected Discharge Plan and Services Expected Discharge Plan: Home/Self Care   Discharge Planning Services: CM Consult Post Acute Care Choice: Durable Medical Equipment Living arrangements for the past 2 months: Apartment                 DME Arranged: Emmit Pomfret) DME Agency: Celesta Aver) Date DME Agency Contacted: 09/07/19 Time DME Agency Contacted: [phone] Representative spoke with at DME Agency: Brenton Grills HH Arranged: NA          Prior Living Arrangements/Services Living arrangements for the past 2 months: Apartment Lives with:: Adult Children Patient language and need for interpreter reviewed:: Yes Do you feel safe going back to the place where you live?: Yes      Need for Family Participation in Patient Care: Yes (Comment) Care giver support system in place?: Yes (comment)   Criminal Activity/Legal Involvement Pertinent to Current Situation/Hospitalization: No - Comment as needed  Activities of Daily Living Home Assistive Devices/Equipment: None ADL Screening (condition at time of admission) Patient's cognitive ability adequate to safely complete daily activities?: Yes Is the patient deaf or have difficulty hearing?:  No Does the patient have difficulty seeing, even when wearing glasses/contacts?: No Does the patient have difficulty concentrating, remembering, or making decisions?: No Patient able to express need for assistance with ADLs?: Yes Does the patient have difficulty dressing or bathing?: No Independently performs ADLs?: Yes (appropriate for developmental age) Does the patient have difficulty walking or climbing stairs?: Yes Weakness of Legs: Both Weakness of Arms/Hands: None  Permission Sought/Granted                  Emotional Assessment Appearance:: Appears stated age Attitude/Demeanor/Rapport: Engaged Affect (typically observed): Appropriate Orientation: : Oriented to Self, Oriented to Place, Oriented to  Time, Oriented to Situation Alcohol / Substance Use: Not Applicable Psych Involvement: No (comment)  Admission diagnosis:  Acute GI bleeding [K92.2] Lower GI bleed [K92.2] Patient Active Problem List   Diagnosis Date Noted  . Acute GI bleeding 09/04/2019  . Steal syndrome dialysis vascular access ([place]) 08/02/2019  . Malnutrition of moderate degree 04/15/2019  . Stroke ([place])   . Type II diabetes mellitus with renal manifestations ([place])   . CAD (coronary artery disease)   . GERD (gastroesophageal reflux disease)   . AV fistula infection ([place])   . Cough   . Other mechanical complication of other vascular grafts, subsequent encounter 03/22/2019  . Chills (without fever) 09/24/2018  . Other disorders of phosphorus metabolism 08/12/2018  . Encounter for immunization 07/28/2018  . Diarrhea, unspecified 07/19/2018  . Hypercalcemia 05/26/2018  . Coagulation defect, unspecified ([place]) 05/03/2018  . Dyspnea, unspecified 05/03/2018  .  Hypokalemia 05/03/2018  . Iron deficiency anemia, unspecified 05/03/2018  . Pain, unspecified 05/03/2018  . Pruritus, unspecified 05/03/2018  . Anemia in chronic kidney disease 04/30/2018  . Atherosclerotic heart disease of native coronary artery  without angina pectoris 04/30/2018  . Nonrheumatic mitral valve disorder, unspecified 04/30/2018  . Anxiety disorder due to known physiological condition 04/30/2018  . ESRD on dialysis ([place]) 04/30/2018  . Ganglion, left wrist 04/30/2018  . Gastro-esophageal reflux disease without esophagitis 04/30/2018  . Headache 04/30/2018  . Personal history of transient ischemic attack (TIA), and cerebral infarction without residual deficits 04/30/2018  . Secondary hyperparathyroidism of renal origin ([place]) 04/30/2018  . Umbilical hernia without obstruction or gangrene 04/30/2018  . Left arm pain   . Vascular dialysis catheter in place [facility])   . CKD (chronic kidney disease) stage 5, GFR less than 15 ml/min (HCC)   . CHF exacerbation ([place]) 04/17/2018  . CKD (chronic kidney disease) stage V requiring chronic dialysis ([place])   . Pleural effusion   . S/P biventricular cardiac pacemaker procedure 03/05/2018  . S/P CABG x 2 02/18/2018  . S/P mitral valve replacement with bioprosthetic valve + CABG x2 + maze procedure 02/18/2018  . H/O mitral valve replacement 02/18/2018  . S/P Maze operation for atrial fibrillation 02/18/2018  . Unspecified protein-calorie malnutrition ([place]) 02/11/2018  . Chest pain due to CAD ([place]) 02/09/2018  . Other specified arthritis, unspecified site 12/21/2017  . Non-ST elevation (NSTEMI) myocardial infarction ([place]) 11/05/2017  . Right lower lobe pneumonia 09/20/2017  . ATN (acute tubular necrosis) ([place]) 09/19/2017  . Periodontal disease 09/19/2017  . Anxiety 09/18/2017  . Type 2 diabetes mellitus with hyperglycemia, without long-term current use of insulin ([place]) 09/16/2017  . ESRD on hemodialysis ([place]) 09/16/2017  . Elevated troponin   . Controlled type 2 diabetes mellitus with diabetic nephropathy ([place])   . Protein-calorie malnutrition, severe 04/21/2017  . Substance abuse ([place])   . Chronic combined systolic and diastolic CHF (congestive heart failure) ([place])   . Pulmonary  hypertension ([place])   . Severe mitral regurgitation   . Stage 4 chronic kidney disease ([place])   . Anemia due to GI blood loss 03/13/2017  . Hyperlipidemia, unspecified 03/12/2017  . Hypertension 03/12/2017  . Hypothyroidism 03/12/2017  . Depression 03/12/2017  . PAF (paroxysmal atrial fibrillation) ([place]) 03/12/2017  . Dysphagia 03/12/2017   PCP:  [name], MD Pharmacy:   [facility], [place] [facility] [place] [address] [place] [address] Phone: [phone] Fax: [phone]     Social Determinants of Health (SDOH) Interventions    Readmission Risk Interventions Readmission Risk Prevention Plan 09/07/2019 04/20/2019  Transportation Screening Complete Complete  Medication Review ([place]) Complete Complete  PCP or Specialist appointment within 3-5 days of discharge - Complete  HRI or [place] Complete Complete  SW Recovery Care/Counseling Consult Complete Complete  Palliative Care Screening Not Applicable Not [place] Not Applicable Not Applicable

## 2019-09-07 NOTE — Progress Notes (Signed)
PROGRESS NOTE    Jocelyn Hill  MWU:[id number] DOB: [birth year] DOA: 09/04/2019 PCP: [name], MD     Brief Narrative:  Jocelyn Hill is a 69 y/o female with history of CAD,PAFandprosthetic heart valve, on chronic anticoagulation with coumadin,ESRD on HD, chronic combined CHF, hypothyroidism, hypertension, chronic pain,presented for evaluation blood clots per rectum and associatedabdominal pain, generalized, cramping in character, and without any alleviating or exacerbating factors. She denies any alcoholorNSAID use, and reports that she takes Protonix daily. Sheunderwentcolonoscopy "years ago" and believes that aside from hemorrhoids, there were no abnormal findings andreports that she was due for repeat screening colonoscopy later this month.  ED Course: Afebrile, hemodynamically stable.Chemistry panel is notable for BUN of 67, CBC features a hemoglobin of 7.6, down from 11.2 in January. INR is elevated to 3.7. 1 unit PRBC transfusion initiated, GI was consulted by the EDP whorecommended warfarin reversal and hospitalist admission.  Hospital course: Patient admitted to [facility] PPI drip. Nephrology consulted for dialysis.She missed her dialysis on Friday due to not feeling good.She underwent colonoscopy.   New events last 24 hours / Subjective: Had a bowel movement last night with small amounts of blood.  None today yet.  Denies any dizziness or lightheadedness.  Assessment & Plan:   Principal Problem:   Anemia due to GI blood loss Active Problems:   Hypertension   Hypothyroidism   PAF (paroxysmal atrial fibrillation) (HCC)   Chronic combined systolic and diastolic CHF (congestive heart failure) (HCC)   ESRD on hemodialysis (HCC)   CAD (coronary artery disease)   Acute GI bleeding    LowerGI bleedingwith associated acute blood loss anemia -Patient haschronically dark stool that she attributes to iron supplementation, and found to have hemoglobin of 7.6  on admission (down from 11.2 in January) in the setting of supratherapeuticINR 3.7 -S/p1 unit pRBC 3/7, 1 unit pRBC 3/8, 5 mg IV vit K, and FFP 3/7 in ED -S/p colonoscopy 3/9 noted to have colon polyps/AVMs, underwent polypectomy and APC. Per GI okay to resume Coumadin in 2 days.  GI feels risk of recurrent GI bleed is moderate to high.  Recommended weighing in risks and benefits of resuming Coumadin. Previous hospitalist discussed with patient's primary cardiologist Dr. Marigene Ehlers who stated okay to discontinue aspirin and resume Coumadin as advised by GI in 2 days without bridging.  They will follow up patient in their clinic in 2 weeks for further risk/benefit analysis.  -Repeat H&H this afternoon  Paroxysmal atrial fibrillation; supratherapeutic INR -Continue Coreg  ESRD -Nephrology following for hemodialysis  Chronic combined systolic and diastolic CHF, bioprosthetic aortic valve -EF was 30-35% in 2019 with grade II diastolic dysfunction  Hypertension -Continue amlodipine, Cardura, Coreg allysine  CAD -No anginal complaints, ASA discontinued -Continue Imdur  Hyperlipidemia -Continue Lipitor  Hypothyroidism -Continue Synthroid  Anxiety -Resumed home medication BuSpar, Xanax for as needed use    DVT prophylaxis: SCD Code Status: Full Family Communication: None at bedside  Disposition Plan:  . Patient is from home prior to admission. . Currently in-hospital treatment needed due to close monitoring of Hgb s/p colonoscopy. . Suspect patient will discharge back home tomorrow if Hgb remains stable.    Consultants:   GI  Nephrology  Procedures:   Colonoscopy 3/9  Antimicrobials:  Anti-infectives (From admission, onward)   None        Objective: Vitals:   09/06/19 1701 09/06/19 1959 09/07/19 0231 09/07/19 0438  BP: (!) 157/75 110/77  127/66  Pulse: 79 77  76  Resp:  19  19  Temp: 98.9 F (37.2 C) 99.3 F (37.4 C)  98.5 F (36.9 C)  TempSrc:  Oral Oral  Oral  SpO2: 98% 99%  100%  Weight:   53.1 kg   Height:        Intake/Output Summary (Last 24 hours) at 09/07/2019 1204 Last data filed at 09/07/2019 0650 Gross per 24 hour  Intake 720 ml  Output 1 ml  Net 719 ml   Filed Weights   09/05/19 1029 09/06/19 0923 09/07/19 0231  Weight: 55.1 kg 55.1 kg 53.1 kg    Examination:  General exam: Appears calm and comfortable  Respiratory system: Clear to auscultation. Respiratory effort normal. No respiratory distress. No conversational dyspnea.  Cardiovascular system: S1 & S2 heard, RRR. No murmurs. No pedal edema. Gastrointestinal system: Abdomen is nondistended, soft and nontender. Normal bowel sounds heard. Central nervous system: Alert and oriented. No focal neurological deficits. Speech clear.  Extremities: Symmetric in appearance  Skin: No rashes, lesions or ulcers on exposed skin  Psychiatry: Judgement and insight appear normal. Mood & affect appropriate.   Data Reviewed: I have personally reviewed following labs and imaging studies  CBC: Recent Labs  Lab 09/04/19 0306 09/04/19 1052 09/04/19 1943 09/04/19 1943 09/05/19 0439 09/05/19 2227 09/06/19 0444 09/06/19 1627 09/07/19 0531  WBC 6.6  --  6.9  --  6.3  --   --   --   --   NEUTROABS  --   --   --   --  5.0  --   --   --   --   HGB 7.6*   < > 7.9*   < > 7.3* 9.8* 9.9* 9.4* 8.7*  HCT 23.2*   < > 23.5*   < > 21.8* 27.6* 28.7* 27.6* 25.3*  MCV 88.5  --  86.4  --  86.5  --   --   --   --   PLT 151  --  144*  --  148*  --   --   --   --    < > = values in this interval not displayed.   Basic Metabolic Panel: Recent Labs  Lab 09/04/19 0306 09/05/19 0439  NA 140 137  K 4.1 4.7  CL 101 105  CO2 27 22  GLUCOSE 175* 137*  BUN 67* 64*  CREATININE 5.41* 4.83*  CALCIUM 8.7* 8.8*   GFR: Estimated Creatinine Clearance: 9.3 mL/min (A) (by C-G formula based on SCr of 4.83 mg/dL (H)). Liver Function Tests: Recent Labs  Lab 09/04/19 0306  AST 18  ALT 15    ALKPHOS 70  BILITOT 0.8  PROT 5.8*  ALBUMIN 3.4*   No results for input(s): LIPASE, AMYLASE in the last 168 hours. No results for input(s): AMMONIA in the last 168 hours. Coagulation Profile: Recent Labs  Lab 09/04/19 0346 09/05/19 0439 09/06/19 0444 09/07/19 0531  INR 3.7* 1.4* 1.2 1.2   Cardiac Enzymes: No results for input(s): CKTOTAL, CKMB, CKMBINDEX, TROPONINI in the last 168 hours. BNP (last 3 results) No results for input(s): PROBNP in the last 8760 hours. HbA1C: No results for input(s): HGBA1C in the last 72 hours. CBG: No results for input(s): GLUCAP in the last 168 hours. Lipid Profile: No results for input(s): CHOL, HDL, LDLCALC, TRIG, CHOLHDL, LDLDIRECT in the last 72 hours. Thyroid Function Tests: No results for input(s): TSH, T4TOTAL, FREET4, T3FREE, THYROIDAB in the last 72 hours. Anemia Panel: No results for input(s): VITAMINB12, FOLATE, FERRITIN, TIBC, IRON,  RETICCTPCT in the last 72 hours. Sepsis Labs: No results for input(s): PROCALCITON, LATICACIDVEN in the last 168 hours.  Recent Results (from the past 240 hour(s))  SARS CORONAVIRUS 2 (TAT 6-24 HRS) Nasopharyngeal Nasopharyngeal Swab     Status: None   Collection Time: 09/04/19  4:37 AM   Specimen: Nasopharyngeal Swab  Result Value Ref Range Status   SARS Coronavirus 2 NEGATIVE NEGATIVE Final    Comment: (NOTE) SARS-CoV-2 target nucleic acids are NOT DETECTED. The SARS-CoV-2 RNA is generally detectable in upper and lower respiratory specimens during the acute phase of infection. Negative results do not preclude SARS-CoV-2 infection, do not rule out co-infections with other pathogens, and should not be used as the sole basis for treatment or other patient management decisions. Negative results must be combined with clinical observations, patient history, and epidemiological information. The expected result is Negative. Fact Sheet for Patients: SugarRoll.be Fact  Sheet for Healthcare Providers: https://www.woods-mathews.com/ This test is not yet approved or cleared by the [place] FDA and  has been authorized for detection and/or diagnosis of SARS-CoV-2 by FDA under an Emergency Use Authorization (EUA). This EUA will remain  in effect (meaning this test can be used) for the duration of the COVID-19 declaration under Section 56 4(b)(1) of the Act, 21 U.S.C. section 360bbb-3(b)(1), unless the authorization is terminated or revoked sooner. Performed at [facility], [place] [address], [place], [place] [address]       Radiology Studies: No results found.    Scheduled Meds: . amLODipine  10 mg Oral QHS  . atorvastatin  80 mg Oral QHS  . busPIRone  15 mg Oral BID  . calcitRIOL  0.5 mcg Oral Q M,W,F-HD  . carvedilol  6.25 mg Oral BID WC  . Chlorhexidine Gluconate Cloth  6 each Topical Q0600  . cholecalciferol  1,000 Units Oral QHS  . darbepoetin (ARANESP) injection - DIALYSIS  60 mcg Intravenous Q Wed-HD  . doxazosin  4 mg Oral Daily  . doxazosin  8 mg Oral QHS  . feeding supplement (NEPRO CARB STEADY)  237 mL Oral BID BM  . ferric citrate  210 mg Oral Daily  . fluticasone  2 spray Each Nare Daily  . gabapentin  300 mg Oral Q M,W,F  . guaiFENesin  600 mg Oral BID  . hydrALAZINE  100 mg Oral TID  . isosorbide mononitrate  120 mg Oral QHS  . levothyroxine  100 mcg Oral QAC breakfast  . mirtazapine  15 mg Oral QHS  . montelukast  10 mg Oral QHS  . multivitamin  1 tablet Oral QHS  . pantoprazole  40 mg Oral QAC breakfast  . polyethylene glycol  17 g Oral BID  . sodium chloride flush  3 mL Intravenous Q12H  . sodium chloride flush  3 mL Intravenous Q12H  . traZODone  100 mg Oral QHS   Continuous Infusions: . sodium chloride 600 mL/hr at 09/06/19 1049     LOS: 2 days      Time spent: 35 minutes   Dessa Phi, DO [facility] 09/07/2019, 12:04 PM   Available via Epic secure chat 7am-7pm After these  hours, please refer to coverage provider listed on [email or web address]

## 2019-09-07 NOTE — Progress Notes (Signed)
Pt. C/o of cramping all over states "I cant take it anymore, take me off". Dr. Jonnie Finner made aware ok to stop HD tx. Pt. Rinsed back 28 min remaining. Pt. stable

## 2019-09-07 NOTE — Progress Notes (Signed)
[facility] Progress Note   Subjective:  Had some blood in stool last night, Hb down 8.7 this am. No CP;/ SOB  Objective Vitals:   09/06/19 1701 09/06/19 1959 09/07/19 0231 09/07/19 0438  BP: (!) 157/75 110/77  127/66  Pulse: 79 77  76  Resp:  19  19  Temp: 98.9 F (37.2 C) 99.3 F (37.4 C)  98.5 F (36.9 C)  TempSrc: Oral Oral  Oral  SpO2: 98% 99%  100%  Weight:   53.1 kg   Height:       Physical Exam General: Well developed, well nourished female in NAD Heart: RRR, no murmurs, rubs or gallops Lungs: clear bilat no wheezing Abdomen: Soft, non-distended, +BS. mildly tender b/l upper quadrants.  Extremities: No edema b/l lower extremities Dialysis Access:  R thigh TDC accessed, maturing RUE AVF + bruit  Home meds:   - warfarin as dict  - hydralazine 100 tid/ norvasc 10 qd/ carvedilol 6.25 bid/ cardura 4am+8pm  - isosorbide mono 120 qd/ sl ntg prn/ asa 81 qd/ atrovastatin 80  - protonix qd/ synthroid 100/ auryxia 210mg  ac tid  - remeron 15/ trazodone 100 / neurontin 300 mwf/ buspar 15 bid/ xanax prn  - montelukast 10  - prn's/ vitamins/ supplements     Dialysis:  [facility] MWF  3.5h   400/800  53kg  2/2.25 bath  R thigh TDC/ maturing R AVF Hep none Venofer 100 mg IV q HD x 5 doses- received 2/5, last tsat 28% Mircera 50 mcg IV q 2 weeks- last dose 2/24 Calcitriol 0.5 mcg PO TIW  Assessment/Plan: 1. Acute GI bleed: Hgb 7.6, INR elevated on presentation. Now sp 2u prbcs w/ Hb up mid 9's. GI did colonoscopy 3/9 showing AVM x 1 which was rx'd, also + polyps which were removed. EGD was negative. Blood in stool last night. Per primary.  2.  ESRD:  MWF HD.  HD today 3.  Hypertension/volume: pt was vol overloaded but SOB resolved w/ HD 2.6 L off and exam euvolemic now. May need lowering of dry wt. UF 2-3 L today as tol.  4.  Anemia due to ABL/ ckd: Due for ESA on 3/10, ordered aranesp. Hb up after 2up prbc as above  5.  Metabolic bone disease: Corrected calcium at  goal, phos pending. Patient is on clear liquid diet, hold binders for now. 6.  Nutrition: eating now, renal diet 7. Paroxysmal a. Fib: INR up 3.7 , is down now 8. CHF: EF 30-35%, volume management with HD as above.       Additional Objective Labs: Basic Metabolic Panel: Recent Labs  Lab 09/04/19 0306 09/05/19 0439  NA 140 137  K 4.1 4.7  CL 101 105  CO2 27 22  GLUCOSE 175* 137*  BUN 67* 64*  CREATININE 5.41* 4.83*  CALCIUM 8.7* 8.8*   Liver Function Tests: Recent Labs  Lab 09/04/19 0306  AST 18  ALT 15  ALKPHOS 70  BILITOT 0.8  PROT 5.8*  ALBUMIN 3.4*   CBC: Recent Labs  Lab 09/04/19 0306 09/04/19 1052 09/04/19 1943 09/04/19 1943 09/05/19 0439 09/05/19 2227 09/06/19 0444 09/06/19 1627 09/07/19 0531  WBC 6.6  --  6.9  --  6.3  --   --   --   --   NEUTROABS  --   --   --   --  5.0  --   --   --   --   HGB 7.6*   < > 7.9*   < >  7.3*   < > 9.9* 9.4* 8.7*  HCT 23.2*   < > 23.5*   < > 21.8*   < > 28.7* 27.6* 25.3*  MCV 88.5  --  86.4  --  86.5  --   --   --   --   PLT 151  --  144*  --  148*  --   --   --   --    < > = values in this interval not displayed.   Blood Culture    Component Value Date/Time   SDES WOUND LEFT UPPER ARM 04/13/2019 1205   SPECREQUEST PERIGRAFT SAMPLE A PT ON VANC 04/13/2019 1205   CULT  04/13/2019 1205    MODERATE STAPHYLOCOCCUS AUREUS NO ANAEROBES ISOLATED Performed at [facility], [place] [address], [place], [place] [address]    REPTSTATUS 04/18/2019 FINAL 04/13/2019 1205    Medications: . sodium chloride 600 mL/hr at 09/06/19 1049   . amLODipine  10 mg Oral QHS  . atorvastatin  80 mg Oral QHS  . busPIRone  15 mg Oral BID  . calcitRIOL  0.5 mcg Oral Q M,W,F-HD  . carvedilol  6.25 mg Oral BID WC  . Chlorhexidine Gluconate Cloth  6 each Topical Q0600  . cholecalciferol  1,000 Units Oral QHS  . darbepoetin (ARANESP) injection - DIALYSIS  60 mcg Intravenous Q Wed-HD  . doxazosin  4 mg Oral Daily  . doxazosin  8 mg  Oral QHS  . feeding supplement (NEPRO CARB STEADY)  237 mL Oral BID BM  . ferric citrate  210 mg Oral Daily  . fluticasone  2 spray Each Nare Daily  . gabapentin  300 mg Oral Q M,W,F  . guaiFENesin  600 mg Oral BID  . hydrALAZINE  100 mg Oral TID  . isosorbide mononitrate  120 mg Oral QHS  . levothyroxine  100 mcg Oral QAC breakfast  . mirtazapine  15 mg Oral QHS  . montelukast  10 mg Oral QHS  . multivitamin  1 tablet Oral QHS  . pantoprazole  40 mg Oral QAC breakfast  . polyethylene glycol  17 g Oral BID  . sodium chloride flush  3 mL Intravenous Q12H  . sodium chloride flush  3 mL Intravenous Q12H  . traZODone  100 mg Oral QHS

## 2019-09-07 NOTE — Plan of Care (Signed)

## 2019-09-08 LAB — BPAM RBC
Blood Product Expiration Date: 202103102359
Blood Product Expiration Date: 202104062359
Blood Product Expiration Date: 202104062359
Blood Product Expiration Date: 202104072359
ISSUE DATE / TIME: 202103070421
ISSUE DATE / TIME: 202103072242
ISSUE DATE / TIME: 202103072242
ISSUE DATE / TIME: 202103081227
Unit Type and Rh: 5100
Unit Type and Rh: 5100
Unit Type and Rh: 5100
Unit Type and Rh: 9500

## 2019-09-08 LAB — TYPE AND SCREEN
ABO/RH(D): O POS
Antibody Screen: NEGATIVE
Unit division: 0
Unit division: 0
Unit division: 0
Unit division: 0

## 2019-09-08 LAB — BASIC METABOLIC PANEL
Anion gap: 12 (ref 5–15)
BUN: 16 mg/dL (ref 8–23)
CO2: 27 mmol/L (ref 22–32)
Calcium: 9.3 mg/dL (ref 8.9–10.3)
Chloride: 101 mmol/L (ref 98–111)
Creatinine, Ser: 3.48 mg/dL — ABNORMAL HIGH (ref 0.44–1.00)
GFR calc Af Amer: 15 mL/min — ABNORMAL LOW (ref >60)
GFR calc non Af Amer: 13 mL/min — ABNORMAL LOW (ref >60)
Glucose, Bld: 118 mg/dL — ABNORMAL HIGH (ref 70–99)
Potassium: 3.5 mmol/L (ref 3.5–5.1)
Sodium: 140 mmol/L (ref 135–145)

## 2019-09-08 LAB — CBC
HCT: 29 % — ABNORMAL LOW (ref 36.0–46.0)
Hemoglobin: 9.7 g/dL — ABNORMAL LOW (ref 12.0–15.0)
MCH: 29.6 pg (ref 26.0–34.0)
MCHC: 33.4 g/dL (ref 30.0–36.0)
MCV: 88.4 fL (ref 80.0–100.0)
Platelets: 155 10*3/uL (ref 150–400)
RBC: 3.28 MIL/uL — ABNORMAL LOW (ref 3.87–5.11)
RDW: 13.6 % (ref 11.5–15.5)
WBC: 10.6 10*3/uL — ABNORMAL HIGH (ref 4.0–10.5)
nRBC: 0 % (ref 0.0–0.2)

## 2019-09-08 NOTE — Progress Notes (Signed)
[facility] Progress Note   Subjective:  No more bloody stools, Hb up w/o transfusion, 9.3 this am  Objective Vitals:   09/07/19 2127 09/08/19 0300 09/08/19 0441 09/08/19 0951  BP: (!) 173/68  139/74 (!) 150/75  Pulse: 72  77   Resp: 19  19   Temp: 99.1 F (37.3 C)  98.3 F (36.8 C)   TempSrc: Oral  Oral   SpO2: 100%  99%   Weight:  52 kg    Height:       Physical Exam General: Well developed, well nourished female in NAD Heart: RRR, no murmurs, rubs or gallops Lungs: clear bilat no wheezing Abdomen: Soft, non-distended, +BS. mildly tender b/l upper quadrants.  Extremities: No edema b/l lower extremities Dialysis Access:  R thigh TDC accessed, maturing RUE AVF + bruit  Home meds:   - warfarin as dict  - hydralazine 100 tid/ norvasc 10 qd/ carvedilol 6.25 bid/ cardura 4am+8pm  - isosorbide mono 120 qd/ sl ntg prn/ asa 81 qd/ atrovastatin 80  - protonix qd/ synthroid 100/ auryxia 210mg  ac tid  - remeron 15/ trazodone 100 / neurontin 300 mwf/ buspar 15 bid/ xanax prn  - montelukast 10  - prn's/ vitamins/ supplements     Dialysis:  [facility] MWF  3.5h   400/800  53kg  2/2.25 bath  R thigh TDC/ maturing R AVF Hep none Venofer 100 mg IV q HD x 5 doses- received 2/5, last tsat 28% Mircera 50 mcg IV q 2 weeks- last dose 2/24 Calcitriol 0.5 mcg PO TIW  Assessment/Plan: 1. Acute GI bleed: Hgb 7.6, INR elevated on presentation. Now sp 2u prbcs w/ Hb up mid 9's. GI did colonoscopy 3/9 showing AVM x 1 which was rx'd, also + polyps which were removed. EGD was negative. Hb up to 9's today, for dc home.  2.  ESRD:  MWF HD.  HD today 3.  Hypertension/volume: pt was vol overloaded but SOB resolved w/ HD here. Is 1kg under dry wt now.  4.  Anemia due to ABL/ ckd: Due for ESA on 3/10, ordered aranesp. Hb up after 2up prbc as above  5.  Metabolic bone disease: Corrected calcium at goal, phos pending. Patient is on clear liquid diet, hold binders for now. 6.  Nutrition: eating  now, renal diet 7. Paroxysmal a. Fib: per primary 8. CHF: EF 30-35%   Kelly Splinter, MD 09/08/2019, 10:59 AM    Additional Objective Labs: Basic Metabolic Panel: Recent Labs  Lab 09/05/19 0439 09/07/19 1200 09/08/19 0517  NA 137 139 140  K 4.7 4.6 3.5  CL 105 107 101  CO2 22 23 27   GLUCOSE 137* 109* 118*  BUN 64* 27* 16  CREATININE 4.83* 4.37* 3.48*  CALCIUM 8.8* 9.0 9.3  PHOS  --  4.0  --    Liver Function Tests: Recent Labs  Lab 09/04/19 0306 09/07/19 1200  AST 18  --   ALT 15  --   ALKPHOS 70  --   BILITOT 0.8  --   PROT 5.8*  --   ALBUMIN 3.4* 3.0*   CBC: Recent Labs  Lab 09/04/19 0306 09/04/19 1052 09/04/19 1943 09/04/19 1943 09/05/19 0439 09/05/19 2227 09/07/19 1200 09/07/19 1634 09/08/19 0517  WBC 6.6  --  6.9   < > 6.3  --  6.4  --  10.6*  NEUTROABS  --   --   --   --  5.0  --   --   --   --  HGB 7.6*   < > 7.9*   < > 7.3*   < > 8.3* 9.6* 9.7*  HCT 23.2*   < > 23.5*   < > 21.8*   < > 24.8* 27.5* 29.0*  MCV 88.5  --  86.4  --  86.5  --  88.9  --  88.4  PLT 151  --  144*   < > 148*  --  146*  --  155   < > = values in this interval not displayed.   Blood Culture    Component Value Date/Time   SDES WOUND LEFT UPPER ARM 04/13/2019 1205   SPECREQUEST PERIGRAFT SAMPLE A PT ON VANC 04/13/2019 1205   CULT  04/13/2019 1205    MODERATE STAPHYLOCOCCUS AUREUS NO ANAEROBES ISOLATED Performed at [facility], [place] [address] [place], [place] [address]    REPTSTATUS 04/18/2019 FINAL 04/13/2019 1205    Medications: . sodium chloride 600 mL/hr at 09/06/19 1049   . amLODipine  10 mg Oral QHS  . atorvastatin  80 mg Oral QHS  . busPIRone  15 mg Oral BID  . calcitRIOL  0.5 mcg Oral Q M,W,F-HD  . carvedilol  6.25 mg Oral BID WC  . Chlorhexidine Gluconate Cloth  6 each Topical Q0600  . cholecalciferol  1,000 Units Oral QHS  . darbepoetin (ARANESP) injection - DIALYSIS  60 mcg Intravenous Q Wed-HD  . doxazosin  4 mg Oral Daily  . doxazosin  8  mg Oral QHS  . feeding supplement (NEPRO CARB STEADY)  237 mL Oral BID BM  . ferric citrate  210 mg Oral Daily  . fluticasone  2 spray Each Nare Daily  . gabapentin  300 mg Oral Q M,W,F  . guaiFENesin  600 mg Oral BID  . hydrALAZINE  100 mg Oral TID  . isosorbide mononitrate  120 mg Oral QHS  . levothyroxine  100 mcg Oral QAC breakfast  . mirtazapine  15 mg Oral QHS  . montelukast  10 mg Oral QHS  . multivitamin  1 tablet Oral QHS  . pantoprazole  40 mg Oral QAC breakfast  . polyethylene glycol  17 g Oral BID  . sodium chloride flush  3 mL Intravenous Q12H  . sodium chloride flush  3 mL Intravenous Q12H  . traZODone  100 mg Oral QHS

## 2019-09-08 NOTE — TOC Transition Note (Signed)
Transition of Care [facility]) - CM/SW Discharge Note   Patient Details  Name: Jocelyn Hill MRN: [id number] Date of Birth: 1950-10-29  Transition of Care [facility]) CM/SW Contact:  Alberteen Sam, LCSW Phone Number: 09/08/2019, 9:31 AM   Clinical Narrative:     CSW confirmed with Brenton Grills with [employer] that rollator to be delivered to patient's home. CSW notes patient has family picking up for discharge. No further dc needs identified at this time.   Final next level of care: Home/Self Care Barriers to Discharge: No Barriers Identified   Patient Goals and CMS Choice Patient states their goals for this hospitalization and ongoing recovery are:: to get better CMS Medicare.gov Compare Post Acute Care list provided to:: Patient Choice offered to / list presented to : Patient  Discharge Placement                Patient to be transferred to facility by: family will take   Patient and family notified of of transfer: 09/08/19  Discharge Plan and Services   Discharge Planning Services: CM Consult Post Acute Care Choice: Durable Medical Equipment          DME Arranged: Gilford Rile rolling with seat DME Agency: [facility] Aver) Date DME Agency Contacted: 09/08/19 Time DME Agency Contacted: 0930 Representative spoke with at DME Agency: Brenton Grills HH Arranged: NA          Social Determinants of Health ([place]) Interventions     Readmission Risk Interventions Readmission Risk Prevention Plan 09/07/2019 04/20/2019  Transportation Screening Complete Complete  Medication Review Press photographer) Complete Complete  PCP or Specialist appointment within 3-5 days of discharge - Complete  HRI or Welcome Complete Complete  SW Recovery Care/Counseling Consult Complete Complete  Palliative Care Screening Not Applicable Not [place] Not Applicable Not Applicable

## 2019-09-08 NOTE — Progress Notes (Signed)
Pathology on colonic polyps reported as 3 tubular adenomas, remaining were hyperplastic. Recommend patient to repeat colonoscopy for surveillance in 3 years.  [name], MD

## 2019-09-08 NOTE — Discharge Summary (Signed)
Physician Discharge Summary  Jocelyn Hill MGQ:[id number] DOB: 01-16-1951 DOA: 09/04/2019  PCP: [name], MD  Admit date: 09/04/2019 Discharge date: 09/08/2019  Admitted From: Home Disposition:  Home   Recommendations for Outpatient Follow-up:  1. Follow up with PCP in 1 week 2. Follow up with Cardiology in 2 weeks  3. Follow up with GI as needed 4. Continue HD as scheduled  5. Recheck CBC in 1 week   Discharge Condition: Stable CODE STATUS: Full  Diet recommendation: Renal diet / heart healthy   Brief/Interim Summary: Jocelyn Hill is a 69 y/o female with history of CAD,PAFandprosthetic heart valve, on chronic anticoagulation with coumadin,ESRD on HD, chronic combined CHF, hypothyroidism, hypertension, chronic pain,presented for evaluation blood clots per rectum and associatedabdominal pain, generalized, cramping in character, and without any alleviating or exacerbating factors. She denies any alcoholorNSAID use, and reports that she takes Protonix daily. Sheunderwentcolonoscopy "years ago" and believes that aside from hemorrhoids, there were no abnormal findings andreports that she was due for repeat screening colonoscopy later this month.  ED Course: Afebrile, hemodynamically stable.Chemistry panel is notable for BUN of 67, CBC features a hemoglobin of 7.6, down from 11.2 in January. INR is elevated to 3.7. 1 unit PRBC transfusion initiated, GI was consulted by the EDP whorecommended warfarin reversal and hospitalist admission.  Hospital course: Patient admitted to [facility]. Nephrology consulted for dialysis.She missed her dialysis on Friday due to not feeling good.She underwent EGD and colonoscopy.   EGD findings: Normal esophagus Normal stomach, cardia, fundus, body, antrum and pylorus. Normal duodenal bulb and duodenum. No evidence of upper GI bleeding.  Colonoscopy findings: Scattered diverticulosis. Old residual blood noted in left  colon. No active bleeding on thorough lavage. 1 small AVM noted in cecum, status post APC and 2 Endo Clip placement. 1 small polyp in ascending, removed with cold biopsy forceps. 2 small polyps in transverse, removed with cold biopsy forceps. Multiple hyperplastic appearing polyps noted in the rectum, few of them removed with hot snare.  Multiple folds of colon appeared adenomatous. It was difficult to insufflate the rectum despite manually applying pressure in the rectal opening. No hemorrhoids noted.  Discharge Diagnoses:  Principal Problem:   Anemia due to GI blood loss Active Problems:   Hypertension   Hypothyroidism   PAF (paroxysmal atrial fibrillation) (HCC)   Chronic combined systolic and diastolic CHF (congestive heart failure) (HCC)   ESRD on hemodialysis (HCC)   CAD (coronary artery disease)   Acute GI bleeding   LowerGI bleedingwith associated acute blood loss anemia -Patient haschronically dark stool that she attributes to iron supplementation, and found to have hemoglobin of 7.6on admission (down from 11.2 in January)in the setting of supratherapeuticINR 3.7 -S/p1 unit pRBC 3/7, 1 unit pRBC 3/8, 5 mg IV vit K, and FFP 3/7 in ED -S/p EGD and colonoscopy3/9 noted to have colon polyps/AVMs, underwent polypectomy and APC. Per GI okay to resume Coumadin in 2 days. GI feels risk of recurrent GI bleed is moderate to high. Recommended weighing in risks and benefits of resuming Coumadin. Previous hospitalist discussed with patient's primary cardiologist Dr. Darnelle Spangle stated okay to discontinue aspirin and resume Coumadin as advised by GI in 2 days without bridging. They will follow up patient in their clinic in 2 weeks for further risk/benefit analysis.  -Hgb stable this morning 9.7   Paroxysmal atrial fibrillation; supratherapeutic INR -ContinueCoreg  ESRD -Nephrology following for hemodialysis  Chronic combined systolic and diastolic CHF, bioprosthetic  aortic valve -EF  was 30-35% in 2019 with grade II diastolic dysfunction  Hypertension -Continue amlodipine, Cardura, Coreg allysine  CAD -No anginal complaints, ASA discontinued -Continue Imdur  Hyperlipidemia -Continue Lipitor  Hypothyroidism -Continue Synthroid  Anxiety -Resumedhome medication BuSpar, Xanax for as needed use     Discharge Instructions  Discharge Instructions    Call MD for:   Complete by: As directed    Recurrent GI bleeding   Call MD for:  difficulty breathing, headache or visual disturbances   Complete by: As directed    Call MD for:  extreme fatigue   Complete by: As directed    Call MD for:  persistant dizziness or light-headedness   Complete by: As directed    Call MD for:  persistant nausea and vomiting   Complete by: As directed    Call MD for:  severe uncontrolled pain   Complete by: As directed    Call MD for:  temperature >100.4   Complete by: As directed    Discharge instructions   Complete by: As directed    You were cared for by a hospitalist during your hospital stay. If you have any questions about your discharge medications or the care you received while you were in the hospital after you are discharged, you can call the unit and ask to speak with the hospitalist on call if the hospitalist that took care of you is not available. Once you are discharged, your primary care physician will handle any further medical issues. Please note that NO REFILLS for any discharge medications will be authorized once you are discharged, as it is imperative that you return to your primary care physician (or establish a relationship with a primary care physician if you do not have one) for your aftercare needs so that they can reassess your need for medications and monitor your lab values.   Increase activity slowly   Complete by: As directed      Allergies as of 09/08/2019      Reactions   Ace Inhibitors Anaphylaxis, Swelling   Motrin Ib  [ibuprofen] Anaphylaxis      Medication List    STOP taking these medications   aspirin 81 MG EC tablet   oxyCODONE 5 MG immediate release tablet Commonly known as: Roxicodone     TAKE these medications   acetaminophen 500 MG tablet Commonly known as: TYLENOL Take 1,500-2,000 mg by mouth 2 (two) times daily as needed for moderate pain or headache.   albuterol 108 (90 Base) MCG/ACT inhaler Commonly known as: VENTOLIN HFA Inhale 2 puffs into the lungs every 6 (six) hours as needed for wheezing or shortness of breath.   ALPRAZolam 0.25 MG tablet Commonly known as: XANAX Take 1 tablet (0.25 mg total) by mouth at bedtime as needed for anxiety.   amLODipine 10 MG tablet Commonly known as: NORVASC Take 10 mg by mouth at bedtime.   atorvastatin 80 MG tablet Commonly known as: LIPITOR Take 1 tablet (80 mg total) by mouth daily. What changed: when to take this   Auryxia 1 GM 210 MG(Fe) tablet Generic drug: ferric citrate Take 210 mg by mouth daily.   b complex vitamins tablet Take 1 tablet by mouth at bedtime.   busPIRone 15 MG tablet Commonly known as: BUSPAR Take 1 tablet (15 mg total) by mouth 2 (two) times daily.   calcitRIOL 0.5 MCG capsule Commonly known as: ROCALTROL Take 1 capsule (0.5 mcg total) by mouth every Monday, Wednesday, and Friday with hemodialysis.   carvedilol  6.25 MG tablet Commonly known as: COREG Take 1 tablet (6.25 mg total) by mouth 2 (two) times daily.   cholecalciferol 1000 units tablet Commonly known as: VITAMIN D Take 1,000 Units by mouth at bedtime.   dextromethorphan-guaiFENesin 30-600 MG 12hr tablet Commonly known as: MUCINEX DM Take 1 tablet by mouth 2 (two) times daily as needed for cough.   diphenhydrAMINE 25 MG tablet Commonly known as: BENADRYL Take 1 tablet (25 mg total) by mouth daily as needed for itching.   doxazosin 4 MG tablet Commonly known as: CARDURA Take 4-8 mg by mouth See admin instructions. Take one tablet (4  mg) by mouth every morning and two tablets (8 mg) at night   feeding supplement (NEPRO CARB STEADY) Liqd Take 237 mLs by mouth 3 (three) times daily between meals. What changed: when to take this   gabapentin 300 MG capsule Commonly known as: NEURONTIN Take 1 capsule (300 mg total) by mouth 3 (three) times a week. After dialysis on M/W/F What changed:   when to take this  additional instructions   heparin 1000 UNIT/ML injection 1 mL (1,000 Units total) by Intracatheter route every Monday, Wednesday, and Friday with hemodialysis.   hydrALAZINE 100 MG tablet Commonly known as: APRESOLINE Take 1 tablet (100 mg total) by mouth 3 (three) times daily.   isosorbide mononitrate 120 MG 24 hr tablet Commonly known as: IMDUR Take 1 tablet (120 mg total) by mouth at bedtime.   levothyroxine 100 MCG tablet Commonly known as: SYNTHROID Take 100 mcg by mouth daily before breakfast.   mirtazapine 15 MG tablet Commonly known as: REMERON Take 15 mg by mouth at bedtime.   montelukast 10 MG tablet Commonly known as: SINGULAIR Take 1 tablet (10 mg total) by mouth daily. What changed: when to take this   multivitamin Tabs tablet Take 1 tablet by mouth at bedtime.   nitroGLYCERIN 0.4 MG SL tablet Commonly known as: NITROSTAT Place 0.4 mg under the tongue every 5 (five) minutes as needed for chest pain.   pantoprazole 40 MG tablet Commonly known as: PROTONIX Take 1 tablet (40 mg total) by mouth 2 (two) times daily before a meal. What changed: when to take this   polyethylene glycol 17 g packet Commonly known as: MIRALAX / GLYCOLAX Take 17 g by mouth daily. What changed:   when to take this  reasons to take this   traZODone 100 MG tablet Commonly known as: DESYREL Take 100 mg by mouth at bedtime.   warfarin 3 MG tablet Commonly known as: COUMADIN Take 1 tablet (3 mg total) by mouth daily at 6 PM. What changed:   how much to take  when to take this  additional  instructions            Durable Medical Equipment  (From admission, onward)         Start     Ordered   09/07/19 1454  For home use only DME 4 wheeled rolling walker with seat  Once    Question:  Patient needs a walker to treat with the following condition  Answer:  Weakness   09/07/19 1453         Follow-up Information    [facility] Follow up.   Why: rollator will be delivered to patient 's home Contact information: [phone]       [name], MD. Schedule an appointment as soon as possible for a visit in 1 week(s).   Specialty: Internal Medicine Contact  information: [place] [address] [phone]        Larey Dresser, MD. Schedule an appointment as soon as possible for a visit in 2 week(s).   Specialty: Cardiology Contact information: [address] [place] [address] [phone]        [name], MD. Call.   Specialty: Gastroenterology Why: Call with any questions Contact information: [address]  [place] [address] [phone]          Allergies  Allergen Reactions  . Ace Inhibitors Anaphylaxis and Swelling  . Motrin Ib [Ibuprofen] Anaphylaxis    Consultations:  GI  Nephrology   Procedures/Studies: CT ABDOMEN PELVIS WO CONTRAST  Result Date: 09/04/2019 CLINICAL DATA:  Abdominal pain.  GI bleed. EXAM: CT ABDOMEN AND PELVIS WITHOUT CONTRAST TECHNIQUE: Multidetector CT imaging of the abdomen and pelvis was performed following the standard protocol without IV contrast. COMPARISON:  03/26/2017 FINDINGS: Lower chest: There is diffuse bronchial wall thickening at the right lung base. There is interlobular septal thickening at the lung bases bilaterally, right worse than left. There is a small right-sided effusion.There is cardiomegaly. The intracardiac blood pool is hypodense relative to the adjacent myocardium consistent with anemia. There is a right femoral approach dialysis  catheter that extends into the right atrium. Hepatobiliary: The liver is normal. Cholelithiasis without acute inflammation. There is no biliary ductal dilation. Pancreas: Normal contours without ductal dilatation. No peripancreatic fluid collection. Spleen: No splenic laceration or hematoma. Adrenals/Urinary Tract: --Adrenal glands: No adrenal hemorrhage. --Right kidney/ureter: No hydronephrosis or perinephric hematoma. --Left kidney/ureter: The left kidney is atrophic. --Urinary bladder: Unremarkable. Stomach/Bowel: --Stomach/Duodenum: No hiatal hernia or other gastric abnormality. Normal duodenal course and caliber. --Small bowel: No dilatation or inflammation. --Colon: There is scattered colonic diverticula without CT evidence for diverticulitis. There is a probable area peristalsis involving the ascending colon near the hepatic flexure (coronal series 6, image 54). --Appendix: Normal. Vascular/Lymphatic: Atherosclerotic calcification is present within the non-aneurysmal abdominal aorta, without hemodynamically significant stenosis. --No retroperitoneal lymphadenopathy. --No mesenteric lymphadenopathy. --No pelvic or inguinal lymphadenopathy. Reproductive: The patient is status post hysterectomy. There is a cystic mass involving the left ovary measuring approximately 3.7 cm (axial series 3, image 71). Other: No ascites or free air. The abdominal wall is normal. Musculoskeletal. No acute displaced fractures. IMPRESSION: 1. Evaluation limited by lack of IV contrast. 2. No definite finding to explain the patient's GI bleeding. 3. There is cholelithiasis without secondary signs of acute cholecystitis. 4. Cardiomegaly with a small right-sided pleural effusion. 5. Bronchial wall thickening and mucus plugging at the lung bases which may indicate underlying infectious or inflammatory bronchiolitis. 6. There is a right femoral approach dialysis catheter in place that extends into the right atrium. 7. There is a 3.7 cm  left ovarian cystic mass. A follow-up pelvic ultrasound is recommended in 6 months. 8.  Aortic Atherosclerosis (ICD10-I70.0). Electronically Signed   By: Constance Holster M.D.   On: 09/04/2019 23:11   DG CHEST PORT 1 VIEW  Result Date: 09/04/2019 CLINICAL DATA:  Cough EXAM: PORTABLE CHEST 1 VIEW COMPARISON:  04/11/2019 FINDINGS: There is a multi lead left-sided pacemaker in place. The heart size is enlarged. The patient is status post prior median sternotomy, valve replacement, and atrial appendage clip placement. There is no pneumothorax. There is vascular congestion with early developing pulmonary edema. There is no large pleural effusion. There is no acute osseous abnormality. No large focal infiltrate. Aortic calcifications are noted. IMPRESSION: Cardiomegaly  with vascular congestion and early developing pulmonary edema. Electronically Signed   By: Constance Holster M.D.   On: 09/04/2019 19:10   VAS US DUPLEX DIALYSIS ACCESS (AVF,AVG)  Result Date: 08/30/2019 DIALYSIS ACCESS Reason for Exam: Routine follow up. Access Site: Right Upper Extremity. Access Type: Basilic vein transposition. History: Stage one right first stage basilic vein transposition 07/15/2019.          Patient complains of left arm numbness and pain. Performing Technologist: Delorise Shiner RVT  Examination Guidelines: A complete evaluation includes B-mode imaging, spectral Doppler, color Doppler, and power Doppler as needed of all accessible portions of each vessel. Unilateral testing is considered an integral part of a complete examination. Limited examinations for reoccurring indications may be performed as noted.  Findings: +--------------------+----------+-----------------+--------+ AVF                 PSV (cm/s)Flow Vol (mL/min)Comments +--------------------+----------+-----------------+--------+ Native artery inflow   280          1909                +--------------------+----------+-----------------+--------+ AVF  Anastomosis        918                              +--------------------+----------+-----------------+--------+  +------------+----------+-------------+----------+--------+ OUTFLOW VEINPSV (cm/s)Diameter (cm)Depth (cm)Describe +------------+----------+-------------+----------+--------+ Prox UA        233        0.69        0.59            +------------+----------+-------------+----------+--------+ Mid UA         198        0.69        0.82            +------------+----------+-------------+----------+--------+ Dist UA        194        0.72        0.69            +------------+----------+-------------+----------+--------+ AC Fossa       576        0.67        0.32            +------------+----------+-------------+----------+--------+   Summary: Patent arteriovenous fistula. *See table(s) above for measurements and observations.  Diagnosing physician: Monica Martinez MD Electronically signed by Monica Martinez MD on 08/30/2019 at 3:35:15 PM.   --------------------------------------------------------------------------------   Final        Discharge Exam: Vitals:   09/07/19 2127 09/08/19 0441  BP: (!) 173/68 139/74  Pulse: 72 77  Resp: 19 19  Temp: 99.1 F (37.3 C) 98.3 F (36.8 C)  SpO2: 100% 99%     General: Pt is alert, awake, not in acute distress Cardiovascular: RRR, S1/S2 +, no edema Respiratory: CTA bilaterally, no wheezing, no rhonchi, no respiratory distress, no conversational dyspnea  Abdominal: Soft, NT, ND, bowel sounds + Extremities: no edema, no cyanosis Psych: Normal mood and affect, stable judgement and insight     The results of significant diagnostics from this hospitalization (including imaging, microbiology, ancillary and laboratory) are listed below for reference.     Microbiology: Recent Results (from the past 240 hour(s))  SARS CORONAVIRUS 2 (TAT 6-24 HRS) Nasopharyngeal Nasopharyngeal Swab     Status: None   Collection Time:  09/04/19  4:37 AM   Specimen: Nasopharyngeal Swab  Result Value Ref Range Status   SARS Coronavirus 2 NEGATIVE NEGATIVE Final  Comment: (NOTE) SARS-CoV-2 target nucleic acids are NOT DETECTED. The SARS-CoV-2 RNA is generally detectable in upper and lower respiratory specimens during the acute phase of infection. Negative results do not preclude SARS-CoV-2 infection, do not rule out co-infections with other pathogens, and should not be used as the sole basis for treatment or other patient management decisions. Negative results must be combined with clinical observations, patient history, and epidemiological information. The expected result is Negative. Fact Sheet for Patients: SugarRoll.be Fact Sheet for Healthcare Providers: https://www.woods-mathews.com/ This test is not yet approved or cleared by the [place] FDA and  has been authorized for detection and/or diagnosis of SARS-CoV-2 by FDA under an Emergency Use Authorization (EUA). This EUA will remain  in effect (meaning this test can be used) for the duration of the COVID-19 declaration under Section 56 4(b)(1) of the Act, 21 U.S.C. section 360bbb-3(b)(1), unless the authorization is terminated or revoked sooner. Performed at [facility], [place] [address] [place], [place] [address]      Labs: BNP (last 3 results) No results for input(s): BNP in the last 8760 hours. Basic Metabolic Panel: Recent Labs  Lab 09/04/19 0306 09/05/19 0439 09/07/19 1200 09/08/19 0517  NA 140 137 139 140  K 4.1 4.7 4.6 3.5  CL 101 105 107 101  CO2 27 22 23 27   GLUCOSE 175* 137* 109* 118*  BUN 67* 64* 27* 16  CREATININE 5.41* 4.83* 4.37* 3.48*  CALCIUM 8.7* 8.8* 9.0 9.3  PHOS  --   --  4.0  --    Liver Function Tests: Recent Labs  Lab 09/04/19 0306 09/07/19 1200  AST 18  --   ALT 15  --   ALKPHOS 70  --   BILITOT 0.8  --   PROT 5.8*  --   ALBUMIN 3.4* 3.0*   No results for  input(s): LIPASE, AMYLASE in the last 168 hours. No results for input(s): AMMONIA in the last 168 hours. CBC: Recent Labs  Lab 09/04/19 0306 09/04/19 1052 09/04/19 1943 09/04/19 1943 09/05/19 0439 09/05/19 2227 09/06/19 1627 09/07/19 0531 09/07/19 1200 09/07/19 1634 09/08/19 0517  WBC 6.6  --  6.9  --  6.3  --   --   --  6.4  --  10.6*  NEUTROABS  --   --   --   --  5.0  --   --   --   --   --   --   HGB 7.6*   < > 7.9*   < > 7.3*   < > 9.4* 8.7* 8.3* 9.6* 9.7*  HCT 23.2*   < > 23.5*   < > 21.8*   < > 27.6* 25.3* 24.8* 27.5* 29.0*  MCV 88.5  --  86.4  --  86.5  --   --   --  88.9  --  88.4  PLT 151  --  144*  --  148*  --   --   --  146*  --  155   < > = values in this interval not displayed.   Cardiac Enzymes: No results for input(s): CKTOTAL, CKMB, CKMBINDEX, TROPONINI in the last 168 hours. BNP: Invalid input(s): POCBNP CBG: No results for input(s): GLUCAP in the last 168 hours. D-Dimer No results for input(s): DDIMER in the last 72 hours. Hgb A1c No results for input(s): HGBA1C in the last 72 hours. Lipid Profile No results for input(s): CHOL, HDL, LDLCALC, TRIG, CHOLHDL, LDLDIRECT in the last 72 hours. Thyroid  function studies No results for input(s): TSH, T4TOTAL, T3FREE, THYROIDAB in the last 72 hours.  Invalid input(s): FREET3 Anemia work up No results for input(s): VITAMINB12, FOLATE, FERRITIN, TIBC, IRON, RETICCTPCT in the last 72 hours. Urinalysis    Component Value Date/Time   COLORURINE YELLOW 02/17/2018 0007   APPEARANCEUR CLEAR 02/17/2018 0007   LABSPEC 1.006 02/17/2018 0007   PHURINE 7.0 02/17/2018 0007   GLUCOSEU NEGATIVE 02/17/2018 0007   HGBUR NEGATIVE 02/17/2018 0007   BILIRUBINUR NEGATIVE 02/17/2018 0007   KETONESUR NEGATIVE 02/17/2018 0007   PROTEINUR 100 (A) 02/17/2018 0007   NITRITE NEGATIVE 02/17/2018 0007   LEUKOCYTESUR NEGATIVE 02/17/2018 0007   Sepsis Labs Invalid input(s): PROCALCITONIN,  WBC,  LACTICIDVEN Microbiology Recent  Results (from the past 240 hour(s))  SARS CORONAVIRUS 2 (TAT 6-24 HRS) Nasopharyngeal Nasopharyngeal Swab     Status: None   Collection Time: 09/04/19  4:37 AM   Specimen: Nasopharyngeal Swab  Result Value Ref Range Status   SARS Coronavirus 2 NEGATIVE NEGATIVE Final    Comment: (NOTE) SARS-CoV-2 target nucleic acids are NOT DETECTED. The SARS-CoV-2 RNA is generally detectable in upper and lower respiratory specimens during the acute phase of infection. Negative results do not preclude SARS-CoV-2 infection, do not rule out co-infections with other pathogens, and should not be used as the sole basis for treatment or other patient management decisions. Negative results must be combined with clinical observations, patient history, and epidemiological information. The expected result is Negative. Fact Sheet for Patients: SugarRoll.be Fact Sheet for Healthcare Providers: https://www.woods-mathews.com/ This test is not yet approved or cleared by the [place] FDA and  has been authorized for detection and/or diagnosis of SARS-CoV-2 by FDA under an Emergency Use Authorization (EUA). This EUA will remain  in effect (meaning this test can be used) for the duration of the COVID-19 declaration under Section 56 4(b)(1) of the Act, 21 U.S.C. section 360bbb-3(b)(1), unless the authorization is terminated or revoked sooner. Performed at [facility], [place] [address] [place], [place] [address]      Patient was seen and examined on the day of discharge and was found to be in stable condition. Time coordinating discharge: 25 minutes including assessment and coordination of care, as well as examination of the patient.   SIGNED:  Dessa Phi, DO [facility] 09/08/2019, 9:19 AM

## 2019-09-08 NOTE — Progress Notes (Signed)
Physical Therapy Treatment Patient Details Name: CORNIE MCCOMBER MRN: [id number] DOB: 1950-08-17 Today's Date: 09/08/2019    History of Present Illness Pt is a 69 y/o female admitted secondary to GI bleed. Pt is s/p EGD and colonoscopy that revealed colonic AVM that was treated and had polyps removed. PMH includes HTN, a fib, ESRD on HD, CAD, and CHF.     PT Comments    Patient is making progress toward PT goals and tolerated session well. SpO2 95% on RA while ambulating. Current plan remains appropriate.   Follow Up Recommendations  No PT follow up     Equipment Recommendations  Other (comment)(rollator)    Recommendations for Other Services       Precautions / Restrictions Precautions Precautions: Fall Restrictions Weight Bearing Restrictions: No    Mobility  Bed Mobility Overal bed mobility: Independent                Transfers Overall transfer level: Modified independent Equipment used: None Transfers: Sit to/from Stand           General transfer comment: rollator used upon standing  Ambulation/Gait Ambulation/Gait assistance: Supervision Gait Distance (Feet): (300 ft with seated rest break due to SOB) Assistive device: (rollator) Gait Pattern/deviations: Step-through pattern;Decreased stride length;Trunk flexed Gait velocity: Decreased   General Gait Details: cues for upright posture, PLB, and safe use of AD    Stairs             Wheelchair Mobility    Modified Rankin (Stroke Patients Only)       Balance Overall balance assessment: Mild deficits observed, not formally tested                                          Cognition Arousal/Alertness: Awake/alert Behavior During Therapy: WFL for tasks assessed/performed Overall Cognitive Status: Within Functional Limits for tasks assessed                                        Exercises      General Comments General comments (skin integrity, edema,  etc.): VSS on RA       Pertinent Vitals/Pain Pain Assessment: No/denies pain    Home Living                      Prior Function            PT Goals (current goals can now be found in the care plan section) Progress towards PT goals: Progressing toward goals    Frequency    Min 3X/week      PT Plan Current plan remains appropriate    Co-evaluation              AM-PAC PT "6 Clicks" Mobility   Outcome Measure  Help needed turning from your back to your side while in a flat bed without using bedrails?: None Help needed moving from lying on your back to sitting on the side of a flat bed without using bedrails?: None Help needed moving to and from a bed to a chair (including a wheelchair)?: A Little Help needed standing up from a chair using your arms (e.g., wheelchair or bedside chair)?: None Help needed to walk in hospital room?: A Little Help needed climbing 3-5 steps with a  railing? : A Little 6 Click Score: 21    End of Session Equipment Utilized During Treatment: Gait belt Activity Tolerance: Patient tolerated treatment well Patient left: in bed;with call bell/phone within reach Nurse Communication: Mobility status PT Visit Diagnosis: Unsteadiness on feet (R26.81);Muscle weakness (generalized) (M62.81)     Time: 9163-8466 PT Time Calculation (min) (ACUTE ONLY): 20 min  Charges:  $Gait Training: 8-22 mins                     Earney Navy, PTA Acute Rehabilitation Services Pager: [phone] Office: [phone]     Darliss Cheney 09/08/2019, 10:12 AM

## 2019-09-09 ENCOUNTER — Telehealth: Payer: Self-pay | Admitting: Nurse Practitioner

## 2019-09-09 NOTE — Telephone Encounter (Signed)
Transition of care contact from inpatient facility  Date of discharge: 09/08/2019 Date of contact:09/09/2019 Method: Phone Spoke to: Patient  Patient contacted to discuss transition of care from recent inpatient hospitalization. Patient was admitted to [facility] from 09/04/2019  To 09/08/2019 with discharge diagnosis of lower GIB with associated acute blood anemia, supra therapeutic INR.   Medication changes were reviewed.  Patient will follow up with his/her outpatient HD unit on: 09/09/2019  Other f/u needs include: Follow up with PCP, Dr. Aundra Dubin and Dr. Lyndel Safe GI.

## 2019-09-13 ENCOUNTER — Other Ambulatory Visit (HOSPITAL_COMMUNITY): Payer: Medicare Other

## 2019-09-14 DIAGNOSIS — T7840XA Allergy, unspecified, initial encounter: Secondary | ICD-10-CM | POA: Insufficient documentation

## 2019-09-15 ENCOUNTER — Encounter (HOSPITAL_COMMUNITY): Admission: RE | Payer: Self-pay | Source: Home / Self Care

## 2019-09-15 ENCOUNTER — Ambulatory Visit (HOSPITAL_COMMUNITY): Admission: RE | Admit: 2019-09-15 | Payer: Medicare Other | Source: Home / Self Care | Admitting: Vascular Surgery

## 2019-09-15 SURGERY — TRANSPOSITION, VEIN, BASILIC
Anesthesia: Monitor Anesthesia Care | Laterality: Right

## 2019-09-19 ENCOUNTER — Telehealth (HOSPITAL_COMMUNITY): Payer: Self-pay | Admitting: *Deleted

## 2019-09-19 NOTE — Telephone Encounter (Signed)
Pt left VM to schedule hospital follow up. I called her back no answer/left VM requesting return call.

## 2019-09-21 ENCOUNTER — Other Ambulatory Visit (HOSPITAL_COMMUNITY)
Admission: RE | Admit: 2019-09-21 | Discharge: 2019-09-21 | Disposition: A | Discharge status: Home / Self Care | Payer: Medicare Other | Source: Ambulatory Visit | Attending: Vascular Surgery | Admitting: Vascular Surgery

## 2019-09-21 DIAGNOSIS — Z01812 Encounter for preprocedural laboratory examination: Secondary | ICD-10-CM | POA: Diagnosis present

## 2019-09-21 DIAGNOSIS — Z20822 Contact with and (suspected) exposure to covid-19: Secondary | ICD-10-CM | POA: Insufficient documentation

## 2019-09-21 LAB — SARS CORONAVIRUS 2 (TAT 6-24 HRS): SARS Coronavirus 2: NEGATIVE

## 2019-09-22 ENCOUNTER — Encounter (HOSPITAL_COMMUNITY): Payer: Self-pay | Admitting: Vascular Surgery

## 2019-09-22 ENCOUNTER — Encounter: Payer: Self-pay | Admitting: Cardiology

## 2019-09-22 ENCOUNTER — Other Ambulatory Visit: Payer: Self-pay

## 2019-09-22 NOTE — Progress Notes (Addendum)
Anesthesia Chart Review: Jocelyn Hill   Case: [id number] Date/Time: 09/23/19 0715   Procedure: BASCILIC VEIN TRANSPOSITION SECOND STAGE (Right )   Anesthesia type: Monitor Anesthesia Care   Pre-op diagnosis: renal failure   Location: [facility] OR ROOM 11 / [place] OR   Surgeons: Marty Heck, MD      DISCUSSION: Patient is a 69 year old female scheduled for the above procedure. Surgery was initially scheduled for 09/15/19, but she had recent admission for GI bleed (clots from rectum) 09/04/19-09/08/19. INR 3.7 and given vit K and FFP. HGB 7.6, s/p 2 units PRBC (total) GI consulted for colonoscopy and EGD (see below) and had small AVM s/p APC/endoclip [name], [name], MD). Nephrology consulted for dialysis management. She has a tunneled right femoral catheter.   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 She is on warfarin for afib.   PAT RN still attempting to reach patient. Warfarin held for three days for previous HD access procedure. According to recent discharge summary, cardiology was contacted regarding ASA/Coumadin given GI bleed and "primary cardiologist Dr. Darnelle Spangle stated okay to discontinue aspirin and resume Coumadin as advised by GI in 2 days without bridging. They will follow up patient in their clinic in 2 weeks for further risk/benefit analysis."  She is a same day work-up, so she will get labs and anesthesia team evaluation on the day of surgery. Perioperative PPM Rx form is still pending from [facility]. She has a [place] with normal  remote check 07/25/19.  09/21/2019 preoperative COVID-19 test negative.   VS:  BP Readings from Last 3 Encounters:  09/08/19 132/67  08/30/19 135/66  08/02/19 (!) 154/72   Pulse Readings from Last 3 Encounters:  09/08/19 81  08/30/19 69  08/02/19 86    PROVIDERS: [name], MD is listed as PCP ([facility]) [facility] provides nephrology care [name], MD is HF cardiologist. Last seen by [name], NP on 05/05/18.  [name], MD is EP cardiologist [name], MD is CT surgeon - Reportedly she has a primary GI in [place]   LABS: She is for ISTAT labs and PT/INR (if she has resumed warfarin) on the day of surgery. Currently, most recent results include: Lab Results  Component Value Date   WBC 10.6 (H) 09/08/2019   HGB 9.7 (L) 09/08/2019   HCT 29.0 (L) 09/08/2019   PLT 155 09/08/2019   GLUCOSE 118 (H) 09/08/2019   ALT 15 09/04/2019   AST 18 09/04/2019   NA 140 09/08/2019   K 3.5 09/08/2019   CL 101 09/08/2019   CREATININE 3.48 (H) 09/08/2019   BUN 16 09/08/2019   CO2 27 09/08/2019   INR 1.2 09/07/2019    OTHER: Colonoscopy 09/06/19: - A single non-bleeding colonic angioectasia. Treated with argon plasma coagulation (APC). Clips (MR conditional) were placed. - One 4 mm polyp in the ascending colon, removed with a cold biopsy forceps. Resected and retrieved. - Two 3 to 4 mm polyps in the transverse colon, removed with a cold biopsy forceps. Resected and retrieved. - Multiple 3 to 7 mm polyps in  the rectum, removed with a hot snare. Resected and retrieved. - Diverticulosis in the sigmoid colon, in the descending colon, in the transverse colon, at the hepatic flexure and in the ascending colon. - The examination was otherwise normal on direct and retroflexion views. It was difficult to insufflate the rectum and sigmoid despite manually trying to hold pressure by closing the rectum with hands. Edematous folds were  noted throughout the colon.  EGD 09/06/19: - Normal esophagus. - Z-line regular, 36 cm from the incisors. - Normal stomach. - Normal examined duodenum. - No specimens collected.  PFTs 12/21/17: FVC 2.50 (81%), FEV1 1.76 (73%), DLCO unc 16.62 (53%), cor 17.48 (56%).   IMAGES: 1V PCXR 09/04/19: FINDINGS: There is a multi lead left-sided pacemaker in place. The heart size is enlarged. The patient is status post prior median sternotomy, valve replacement, and atrial appendage clip placement. There is no pneumothorax. There is vascular congestion with early developing pulmonary edema. There is no large pleural effusion. There is no acute osseous abnormality. No large focal infiltrate. Aortic calcifications are noted. IMPRESSION: Cardiomegaly with vascular congestion and early developing pulmonary edema.   EKG: 04/17/19:  Atrial-sensed ventricular-paced rhythm Biventricular pacemaker detected Abnormal ECG No significant change since last tracing Confirmed by Quay Burow [phone]) on 04/17/2019 4:25:36 PM   CV: Echo 04/18/18: Study Conclusions - Left ventricle: The cavity size was normal. There was severe concentric hypertrophy. Systolic function was moderately to severely reduced. The estimated ejection fraction was in the range of 30% to 35%. Features are consistent with a pseudonormal left ventricular filling pattern, with concomitant abnormal relaxation and increased filling pressure (grade 2 diastolic dysfunction). Doppler parameters are consistent with elevated ventricular end-diastolic filling pressure. - Mitral valve: S/P MVR with a bioprosthetic valve. Mean gradient (D): 5 mm Hg. Valve area by continuity equation (using LVOT flow): 1.06 cm^2. - Right ventricle: Systolic function was normal. - Right atrium: The atrium was normal in size. - Pulmonic valve: There was mild regurgitation. - Pulmonary arteries: Systolic pressure was within the  normal range. - Pericardium, extracardiac: A trivial pericardial effusion was identified posterior to the heart. Features were not consistent with tamponade physiology. There was a large left pleural effusion. Impressions: - LVEF is 30-35% with diffuse hypokinesis and significant paradoxical septal motion and interventricular dyssynchrony. LVEDP is severely elevated with E/E&' =32. LVEF is unchanged from the prior echocardiogram from 02/22/2018.  Carotid US 12/21/17: Final Interpretation: - Right Carotid: Velocities in the right ICA are consistent with a 1-39% stenosis. - Left Carotid: Velocities in the left ICA are consistent with a 1-39% stenosis. - Vertebrals: Bilateral vertebral arteries demonstrate antegrade flow. - Subclavians: Normal flow hemodynamics were seen in bilateral subclavian arteries.   Cardiac cath 07/03/17 (pre-MVR/CABG): Conclusion: 1. Low filling pressure and preserved cardiac output.  2. 90% ostial LCx stenosis.  3. Napkin ring-like stenosis in the proximal LAD, at least 60% stenosis  - Patient will have MV surgery. She will need CABG with grafting of LCx +/- LAD.    Past Medical History:  Diagnosis Date  . Anemia    low iron  . Anxiety   . Arthritis   . Bronchitis   . CHF (congestive heart failure) ([place])   . Coronary artery disease   . Depression   . Diabetes mellitus without complication ([place])   . Diet-controlled diabetes mellitus ([place])   . Dyspnea    SOME   . Dysrhythmia    PAF  . ESRD (end stage renal disease) ([place]) 03/16/2017  MWF- [place]  . Fibromyalgia   . Ganglion cyst    left wrist  . GERD (gastroesophageal reflux disease)   . Headache    chronic headaches- in the past (07/14/2019)  . Heart failure ([place])   . Heart murmur   . Hyperlipidemia   . Hypertension   . Hypothyroidism   . Mitral regurgitation   . Myocardial infarction ([place])    3x last one 2008  . PAF (paroxysmal atrial fibrillation) ([place])   .  Pneumonia 2020   x 2  . S/P Maze operation for atrial fibrillation 02/18/2018   Complete bilateral atrial lesion set using bipolar radiofrequency and cryothermy ablation with clipping of LA appendage  . S/P mitral valve replacement with bioprosthetic valve 02/18/2018   [facility] Mitral stented bovine pericardial tissue valve Model 7300 TFX Serial # [id number] Size 29  . Stroke [employer])    no lasting residual - ? 2014  . Umbilical hernia     Past Surgical History:  Procedure Laterality Date  . ABDOMINAL HYSTERECTOMY     PARTIALS  . AV FISTULA PLACEMENT Left 04/23/2018   Procedure: LEFT ARTERIOVENOUS (AV) GRAFT CREATION;  Surgeon: Marty Heck, MD;  Location: [place];  Service: Vascular;  Laterality: Left;  . AV FISTULA PLACEMENT Right 07/15/2019   Procedure: ARTERIOVENOUS (AV) FISTULA CREATION;  Surgeon: Marty Heck, MD;  Location: [place];  Service: Vascular;  Laterality: Right;  . BIOPSY  09/06/2019   Procedure: BIOPSY;  Surgeon: [name], MD;  Location: [facility] ENDOSCOPY;  Service: Gastroenterology;;  . BIV PACEMAKER INSERTION CRT-P N/A 02/26/2018   Procedure: BIV PACEMAKER INSERTION CRT-P;  Surgeon: Constance Haw, MD;  Location: [place] CV LAB;  Service: Cardiovascular;  Laterality: N/A;  . CARDIAC CATHETERIZATION    . CESAREAN SECTION    . COLONOSCOPY    . COLONOSCOPY WITH PROPOFOL N/A 09/06/2019   Procedure: COLONOSCOPY WITH PROPOFOL;  Surgeon: [name], MD;  Location: [place];  Service: Gastroenterology;  Laterality: N/A;  . CORONARY ARTERY BYPASS GRAFT N/A 02/18/2018   Procedure: CORONARY ARTERY BYPASS GRAFTING (CABG) WITH IMA. ENDOSCOPIC VEIN HARVEST. IMA TO LAD, SVG TO CIRC.;  Surgeon: Rexene Alberts, MD;  Location: [place];  Service: Open Heart Surgery;  Laterality: N/A;  . ESOPHAGOGASTRODUODENOSCOPY (EGD) WITH PROPOFOL N/A 09/06/2019   Procedure: ESOPHAGOGASTRODUODENOSCOPY (EGD) WITH PROPOFOL;  Surgeon: [name], MD;  Location: [place];  Service:  Gastroenterology;  Laterality: N/A;  . HEMOSTASIS CLIP PLACEMENT  09/06/2019   Procedure: HEMOSTASIS CLIP PLACEMENT;  Surgeon: [name], MD;  Location: [place];  Service: Gastroenterology;;  . HOT HEMOSTASIS N/A 09/06/2019   Procedure: HOT HEMOSTASIS (ARGON PLASMA COAGULATION/BICAP);  Surgeon: [name], MD;  Location: [place];  Service: Gastroenterology;  Laterality: N/A;  . INSERTION OF DIALYSIS CATHETER N/A 02/18/2018   Procedure: PLACEMENT OF TEMPORARY HD CATHETER, POWER TRIALYSIS 13FR 20CM;  Surgeon: Rexene Alberts, MD;  Location: [place];  Service: Vascular;  Laterality: N/A;  . INSERTION OF DIALYSIS CATHETER Right 04/23/2018   Procedure: INSERTION OF DIALYSIS CATHETER;  Surgeon: Marty Heck, MD;  Location: [place];  Service: Vascular;  Laterality: Right;  . INSERTION OF DIALYSIS CATHETER Right 03/01/2019   Procedure: INSERTION OF DIALYSIS CATHETER RIGHT Femoral;  Surgeon: Elam Dutch, MD;  Location: [place];  Service: Vascular;  Laterality: Right;  . IR NEPHRO TUBE REMOV/FL  04/02/2017  . IR NEPHROSTOGRAM RIGHT THRU EXISTING ACCESS  04/02/2017  . IR NEPHROSTOMY PLACEMENT RIGHT  03/27/2017  .  IR THORACENTESIS ASP PLEURAL SPACE W/IMG GUIDE  03/24/2017  . LIGATION ARTERIOVENOUS GORTEX GRAFT Left 03/01/2019   Procedure: LIGATION ARTERIOVENOUS GORTEX GRAFT Left arm;  Surgeon: Elam Dutch, MD;  Location: [place];  Service: Vascular;  Laterality: Left;  Marland Kitchen MAZE N/A 02/18/2018   Procedure: MAZE;  Surgeon: Rexene Alberts, MD;  Location: [place];  Service: Open Heart Surgery;  Laterality: N/A;  . MITRAL VALVE REPLACEMENT N/A 02/18/2018   Procedure: MITRAL VALVE (MV) REPLACEMENT;  Surgeon: Rexene Alberts, MD;  Location: [place];  Service: Open Heart Surgery;  Laterality: N/A;  . MULTIPLE EXTRACTIONS WITH ALVEOLOPLASTY N/A 11/05/2017   Procedure: Extraction of tooth #'s 4,5,7,8,9,10,12,14 and 29 with alveoloplasty and gross debridement of remaining teeth;  Surgeon: [name],  DDS;  Location: [place];  Service: Oral Surgery;  Laterality: N/A;  . PATENT FORAMEN OVALE(PFO) CLOSURE N/A 02/18/2018   Procedure: PATENT FORAMEN OVALE (PFO) CLOSURE;  Surgeon: Rexene Alberts, MD;  Location: [place];  Service: Open Heart Surgery;  Laterality: N/A;  . POLYPECTOMY  09/06/2019   Procedure: POLYPECTOMY;  Surgeon: [name], MD;  Location: [place];  Service: Gastroenterology;;  . REMOVAL OF GRAFT Left 04/13/2019   Procedure: REMOVAL OF INFECTED GRAFT LEFT UPPER ARM WITH VEIN PATCH OF LEFT BRACHIAL ARTERY;  Surgeon: Rosetta Posner, MD;  Location: [place];  Service: Vascular;  Laterality: Left;  . RIGHT/LEFT HEART CATH AND CORONARY ANGIOGRAPHY N/A 07/03/2017   Procedure: RIGHT/LEFT HEART CATH AND CORONARY ANGIOGRAPHY;  Surgeon: Larey Dresser, MD;  Location: [place] CV LAB;  Service: Cardiovascular;  Laterality: N/A;  . TEE WITHOUT CARDIOVERSION N/A 10/08/2017   Procedure: TRANSESOPHAGEAL ECHOCARDIOGRAM (TEE);  Surgeon: Larey Dresser, MD;  Location: [facility] ENDOSCOPY;  Service: Cardiovascular;  Laterality: N/A;  . TEE WITHOUT CARDIOVERSION N/A 02/18/2018   Procedure: TRANSESOPHAGEAL ECHOCARDIOGRAM (TEE);  Surgeon: Rexene Alberts, MD;  Location: [place];  Service: Open Heart Surgery;  Laterality: N/A;  . TONSILLECTOMY    . TUBAL LIGATION    . UPPER EXTREMITY VENOGRAPHY Right 06/09/2019   Procedure: UPPER EXTREMITY VENOGRAPHY;  Surgeon: Marty Heck, MD;  Location: [place] CV LAB;  Service: Cardiovascular;  Laterality: Right;    MEDICATIONS: No current facility-administered medications for this encounter.   Marland Kitchen acetaminophen (TYLENOL) 500 MG tablet  . albuterol (PROVENTIL HFA;VENTOLIN HFA) 108 (90 Base) MCG/ACT inhaler  . ALPRAZolam (XANAX) 0.25 MG tablet  . amLODipine (NORVASC) 10 MG tablet  . atorvastatin (LIPITOR) 80 MG tablet  . AURYXIA 1 GM 210 MG(Fe) tablet  . b complex vitamins tablet  . busPIRone (BUSPAR) 15 MG tablet  . calcitRIOL (ROCALTROL) 0.5 MCG capsule   . carvedilol (COREG) 6.25 MG tablet  . cholecalciferol (VITAMIN D) 1000 units tablet  . dextromethorphan-guaiFENesin (MUCINEX DM) 30-600 MG 12hr tablet  . diphenhydrAMINE (BENADRYL) 25 MG tablet  . doxazosin (CARDURA) 4 MG tablet  . gabapentin (NEURONTIN) 300 MG capsule  . heparin 1000 UNIT/ML injection  . hydrALAZINE (APRESOLINE) 100 MG tablet  . isosorbide mononitrate (IMDUR) 120 MG 24 hr tablet  . levothyroxine (SYNTHROID) 100 MCG tablet  . mirtazapine (REMERON) 15 MG tablet  . montelukast (SINGULAIR) 10 MG tablet  . multivitamin (RENA-VIT) TABS tablet  . nitroGLYCERIN (NITROSTAT) 0.4 MG SL tablet  . Nutritional Supplements (FEEDING SUPPLEMENT, NEPRO CARB STEADY,) LIQD  . pantoprazole (PROTONIX) 40 MG tablet  . polyethylene glycol (MIRALAX / GLYCOLAX) packet  . traZODone (DESYREL) 100 MG tablet  . warfarin (COUMADIN) 3 MG  tablet     Myra Gianotti, PA-C Surgical Short Stay/Anesthesiology [facility] Phone [phone] [facility] Phone [phone] 09/22/2019 4:26 PM

## 2019-09-22 NOTE — Progress Notes (Signed)
[place] DEVICE PROGRAMMING  Patient Information: Name:  SAMIRAH SCARPATI  DOB:  Feb 24, 1951  MRN:  [id number]    Planned procedure: Right Second Stage Basilic Vein Transposition  Surgeon: Dr. Monica Martinez  Date of procedure: 09/23/19  Cautery will be used   Please send documentation back to Lilia Pro, RN (Fax [phone])    Device Information:  Clinic EP Physician:  [name], MD  Device Type:  Pacemaker Manufacturer and Phone #:  St. Jude/[employer]ott: [phone] Pacemaker Dependent?:  Unknown (last in-office interrogation 02/2018) Date of Last Device Check:  07/25/19 (remote transmission) Normal Device Function?:  Yes.    Electrophysiologist's Recommendations:   Have magnet available.  Provide continuous ECG monitoring when magnet is used or reprogramming is to be performed.   Patient is overdue for in-clinic check. Please contact [place] representative for device interrogation and potential peri-operative reprogramming.  Per Device Clinic [address] [place], [place]  6:28 PM 09/22/2019

## 2019-09-22 NOTE — Progress Notes (Signed)
Error

## 2019-09-22 NOTE — Progress Notes (Signed)
Spoke with pt for pre-op call. Pt has hx of CAD with CABG. Pt denies any recent chest pain or sob. Pt is on Coumadin for A-fib, states her last dose was 09/18/19.   Covid test was done on 09/21/19 and it's negative. Pt states she has been in quarantine since the test was done except when she went to dialysis today. Pt states she got her 1st Covid vaccine today.

## 2019-09-22 NOTE — Progress Notes (Signed)
SDW  Your procedure is scheduled on Friday March 26.  Report to [facility] Main Entrance "A" at 05:30 A.M., and check in at the Admitting office.  Call this number if you have problems the morning of surgery: [phone]  Call [phone] if you have any questions prior to your surgery date Monday-Friday 8am-4pm   Remember: Do not eat or drink after midnight the night before your surgery  Take these medicines the morning of surgery with A SIP OF WATER: busPIRone (BUSPAR)  carvedilol (COREG) doxazosin (CARDURA) gabapentin (NEURONTIN) hydrALAZINE (APRESOLINE)  levothyroxine (SYNTHROID)  pantoprazole (PROTONIX)  Take as needed:  acetaminophen (TYLENOL) albuterol (PROVENTIL HFA;VENTOLIN HFA) inhaler diphenhydrAMINE (BENADRYL)  nitroGLYCERIN (NITROSTAT)  Please bring all inhalers with you the day of surgery.    As of today, STOP taking warfarin (COUMADIN), any Aspirin (unless otherwise instructed by your surgeon), Aleve, Naproxen, Ibuprofen, Motrin, Advil, Goody's, BC's, all herbal medications, fish oil, and all vitamins. (including AURYXIA, b complex, calcitriol, vitamin D, and multivitamin)    The Morning of Surgery  Do not wear jewelry, make-up or nail polish.  Do not wear lotions, powders, or perfumes/colognes, or deodorant  Do not shave 48 hours prior to surgery.    Do not bring valuables to the hospital.  [facility] is not responsible for any belongings or valuables.  If you are a smoker, DO NOT Smoke 24 hours prior to surgery  If you wear a CPAP at night please bring your mask the morning of surgery   Remember that you must have someone to transport you home after your surgery, and remain with you for 24 hours if you are discharged the same day.   Please bring cases for contacts, glasses, hearing aids, dentures or bridgework because it cannot be worn into surgery.    Leave your suitcase in the car.  After surgery it may be brought to your room.  For patients  admitted to the hospital, discharge time will be determined by your treatment team.  Patients discharged the day of surgery will not be allowed to drive home.   Day of Surgery:  Please shower the morning of surgery  Do not apply any deodorants/lotions. Please wear clean clothes to the hospital/surgery center.   Remember to brush your teeth WITH YOUR REGULAR TOOTHPASTE.   Please read over the following fact sheets that you were given.

## 2019-09-22 NOTE — Anesthesia Preprocedure Evaluation (Deleted)
Anesthesia Evaluation    Airway        Dental   Pulmonary shortness of breath, former smoker,           Cardiovascular hypertension, Pt. on medications and Pt. on home beta blockers + CAD, + Past MI, + CABG and +CHF  + dysrhythmias + pacemaker + Valvular Problems/Murmurs      Neuro/Psych CVA    GI/Hepatic Neg liver ROS, GERD  ,  Endo/Other  diabetesHypothyroidism   Renal/GU ESRF and DialysisRenal disease     Musculoskeletal  (+) Arthritis , Fibromyalgia -  Abdominal   Peds  Hematology  (+) Blood dyscrasia, anemia ,   Anesthesia Other Findings   Reproductive/Obstetrics                             (ESRD on HD MWF. S/p ligation of her left upper arm AV graft for steal syndrome 03/01/19 and subsequently required removal of infected graft on 04/13/2019.   Follows with cardiology for hx of CAD, PAF s/p Bioprosthetic MVR, Maze, LA appendage clip, PFO closure, and CABG with LIMA-LAD and SVG-LCx 09/812, complicated by AKI and CHB requiring St Jude BiV PPM implantation. She was last seen by cardiology 04/17/18. It appears he last remote transmission was 03/09/18, shortly after it was placed.   EKG 04/17/19: Atrial-sensed ventricular-paced rhythm. Rate 89. Biventricular pacemaker detected.  TTE 04/18/18: - Left ventricle: The cavity size was normal. There was severe   concentric hypertrophy. Systolic function was moderately to   severely reduced. The estimated ejection fraction was in the   range of 30% to 35%. Features are consistent with a pseudonormal   left ventricular filling pattern, with concomitant abnormal   relaxation and increased filling pressure (grade 2 diastolic   dysfunction). Doppler parameters are consistent with elevated   ventricular end-diastolic filling pressure. - Mitral valve: S/P MVR with a bioprosthetic valve. Mean gradient   (D): 5 mm Hg. Valve area by continuity equation (using  LVOT   flow): 1.06 cm^2. - Right ventricle: Systolic function was normal. - Right atrium: The atrium was normal in size. - Pulmonic valve: There was mild regurgitation. - Pulmonary arteries: Systolic pressure was within the normal   range. - Pericardium, extracardiac: A trivial pericardial effusion was   identified posterior to the heart. Features were not consistent   with tamponade physiology. There was a large left pleural   effusion.  Impressions:  - LVEF is 30-35% with diffuse hypokinesis and significant   paradoxical septal motion and interventricular dyssynchrony.   LVEDP is severely elevated with E/E&' =32. LVEF is unchanged from   the prior echocardiogram from 02/22/2018.)     Anesthesia Physical Anesthesia Plan  ASA: IV  Anesthesia Plan: General   Post-op Pain Management:    Induction: Intravenous  PONV Risk Score and Plan: 3 and Dexamethasone, Ondansetron and Treatment may vary due to age or medical condition  Airway Management Planned: LMA  Additional Equipment:   Intra-op Plan:   Post-operative Plan: Extubation in OR  Informed Consent:   Plan Discussed with:   Anesthesia Plan Comments: (  )       Anesthesia Quick Evaluation

## 2019-09-23 ENCOUNTER — Ambulatory Visit (HOSPITAL_COMMUNITY): Admission: RE | Admit: 2019-09-23 | Payer: Medicare Other | Source: Home / Self Care | Admitting: Vascular Surgery

## 2019-09-23 SURGERY — TRANSPOSITION, VEIN, BASILIC
Anesthesia: Monitor Anesthesia Care | Laterality: Right

## 2019-09-23 MED ORDER — DEXAMETHASONE SODIUM PHOSPHATE 10 MG/ML IJ SOLN
INTRAMUSCULAR | Status: AC
  Filled 2019-09-23: qty 1

## 2019-09-23 MED ORDER — ONDANSETRON HCL 4 MG/2ML IJ SOLN
INTRAMUSCULAR | Status: AC
  Filled 2019-09-23: qty 2

## 2019-09-23 MED ORDER — PROPOFOL 10 MG/ML IV BOLUS
INTRAVENOUS | Status: AC
  Filled 2019-09-23: qty 40

## 2019-09-23 MED ORDER — FENTANYL CITRATE (PF) 250 MCG/5ML IJ SOLN
INTRAMUSCULAR | Status: AC
  Filled 2019-09-23: qty 5

## 2019-09-23 MED ORDER — LIDOCAINE 2% (20 MG/ML) 5 ML SYRINGE
INTRAMUSCULAR | Status: AC
  Filled 2019-09-23: qty 5

## 2019-09-23 MED ORDER — MIDAZOLAM HCL 2 MG/2ML IJ SOLN
INTRAMUSCULAR | Status: AC
  Filled 2019-09-23: qty 2

## 2019-09-23 NOTE — Progress Notes (Addendum)
Patient was scheduled to arrive at [facility].  Patient called at 0530 and stated that she just woke up and would be late.  Patient called back at 0630 and stated it would be 30-45 minutes before she could arrive.   OR desk notified.  Dr. Carlis Abbott paged and made aware.     Addendum: 5913 - patient called and stated that she ran out of gas and will not be able to come for surgery today.  Stated that she would reach out to the office and reschedule.  Dr. Carlis Abbott aware.

## 2019-10-07 ENCOUNTER — Other Ambulatory Visit (HOSPITAL_COMMUNITY)
Admission: RE | Admit: 2019-10-07 | Discharge: 2019-10-07 | Disposition: A | Discharge status: Home / Self Care | Payer: Medicare Other | Source: Ambulatory Visit | Attending: Vascular Surgery | Admitting: Vascular Surgery

## 2019-10-07 DIAGNOSIS — Z20822 Contact with and (suspected) exposure to covid-19: Secondary | ICD-10-CM | POA: Diagnosis not present

## 2019-10-07 DIAGNOSIS — Z01812 Encounter for preprocedural laboratory examination: Secondary | ICD-10-CM | POA: Diagnosis present

## 2019-10-07 LAB — SARS CORONAVIRUS 2 (TAT 6-24 HRS): SARS Coronavirus 2: NEGATIVE

## 2019-10-17 ENCOUNTER — Ambulatory Visit (INDEPENDENT_AMBULATORY_CARE_PROVIDER_SITE_OTHER): Payer: Medicare Other | Admitting: Cardiology

## 2019-10-17 ENCOUNTER — Other Ambulatory Visit: Payer: Self-pay

## 2019-10-17 ENCOUNTER — Encounter: Payer: Self-pay | Admitting: Cardiology

## 2019-10-17 VITALS — BP 172/76 | HR 70 | Ht 69.0 in | Wt 124.0 lb

## 2019-10-17 DIAGNOSIS — I442 Atrioventricular block, complete: Secondary | ICD-10-CM | POA: Diagnosis not present

## 2019-10-17 DIAGNOSIS — Z950 Presence of cardiac pacemaker: Secondary | ICD-10-CM | POA: Diagnosis not present

## 2019-10-17 DIAGNOSIS — I480 Paroxysmal atrial fibrillation: Secondary | ICD-10-CM

## 2019-10-17 LAB — CUP PACEART INCLINIC DEVICE CHECK
Battery Remaining Longevity: 87 mo
Battery Voltage: 2.98 V
Brady Statistic RA Percent Paced: 30 %
Brady Statistic RV Percent Paced: 99 %
Date Time Interrogation Session: 20210419153618
Implantable Lead Implant Date: 20190830
Implantable Lead Implant Date: 20190830
Implantable Lead Implant Date: 20190830
Implantable Lead Location: 753858
Implantable Lead Location: 753859
Implantable Lead Location: 753860
Implantable Pulse Generator Implant Date: 20190830
Lead Channel Impedance Value: 375 Ohm
Lead Channel Impedance Value: 412.5 Ohm
Lead Channel Impedance Value: 687.5 Ohm
Lead Channel Pacing Threshold Amplitude: 0.5 V
Lead Channel Pacing Threshold Amplitude: 0.75 V
Lead Channel Pacing Threshold Amplitude: 1.25 V
Lead Channel Pacing Threshold Pulse Width: 0.5 ms
Lead Channel Pacing Threshold Pulse Width: 0.5 ms
Lead Channel Pacing Threshold Pulse Width: 0.8 ms
Lead Channel Sensing Intrinsic Amplitude: 1.6 mV
Lead Channel Sensing Intrinsic Amplitude: 8.5 mV
Lead Channel Setting Pacing Amplitude: 1.5 V
Lead Channel Setting Pacing Amplitude: 2 V
Lead Channel Setting Pacing Amplitude: 2.25 V
Lead Channel Setting Pacing Pulse Width: 0.5 ms
Lead Channel Setting Pacing Pulse Width: 0.8 ms
Lead Channel Setting Sensing Sensitivity: 2 mV
Pulse Gen Model: 3562
Pulse Gen Serial Number: 9043873

## 2019-10-17 MED ORDER — CARVEDILOL 25 MG PO TABS: 25.0000 mg | ORAL_TABLET | Freq: Two times a day (BID) | ORAL | 3 refills | Status: DC

## 2019-10-17 NOTE — Patient Instructions (Signed)
Medication Instructions:  Your physician has recommended you make the following change in your medication:  1. INCREASE Carvedilol to 25 mg twice a day  *If you need a refill on your cardiac medications before your next appointment, please call your pharmacy*   Lab Work: None ordered If you have labs (blood work) drawn today and your tests are completely normal, you will receive your results only by: Marland Kitchen MyChart Message (if you have MyChart) OR . A paper copy in the mail If you have any lab test that is abnormal or we need to change your treatment, we will call you to review the results.   Testing/Procedures: None ordered   Follow-Up: At [facility], you and your health needs are our priority.  As part of our continuing mission to provide you with exceptional heart care, we have created designated Provider Care Teams.  These Care Teams include your primary Cardiologist (physician) and Advanced Practice Providers (APPs -  Physician Assistants and Nurse Practitioners) who all work together to provide you with the care you need, when you need it.  We recommend signing up for the patient portal called "MyChart".  Sign up information is provided on this After Visit Summary.  MyChart is used to connect with patients for Virtual Visits (Telemedicine).  Patients are able to view lab/test results, encounter notes, upcoming appointments, etc.  Non-urgent messages can be sent to your provider as well.   To learn more about what you can do with MyChart, go to [email or web address].    Your next appointment:   1 year(s)  The format for your next appointment:   In Person  Provider:   [name], MD   Thank you for choosing [facility]!!   Trinidad Curet, RN [phone]    Other Instructions

## 2019-10-17 NOTE — Progress Notes (Signed)
Electrophysiology Office Note   Date:  10/17/2019   ID:  Jocelyn Hill, DOB October 06, 1950, MRN [id number]  PCP:  [name], MD  Cardiologist:  Aundra Dubin Primary Electrophysiologist:  Constance Haw, MD    No chief complaint on file.    History of Present Illness: Jocelyn Hill is a 69 y.o. female who is being seen today for the evaluation of complete AV block at the request of Antil, Venia Carbon, MD. Presenting today for electrophysiology evaluation.  She has a history of mitral regurgitation, chronic diastolic heart failure, CAD status post MI, long-standing hypertension, diabetes, CKD stage IV-V, and stroke.  She presented to [facility]/12/2017 with hypoxic respiratory failure.  Echo showed normal LV systolic function with severe MR.  She was diuresed with Lasix over 20 pounds.  She was thought not to be a candidate for MVR at the time.  Hospital course was complicated by respiratory failure, renal failure, atrial fibrillation, retroperitoneal bleed, and right hydronephrosis.  She also had E. coli bacteremia.  She was admitted 02/09/18 through 03/05/2018 for CABG and MVR with bioprosthetic valve.  She underwent CRT-P implant due to complete heart block on 02/26/2018.  Today, denies symptoms of palpitations, chest pain, shortness of breath, orthopnea, PND, lower extremity edema, claudication, dizziness, presyncope, syncope, bleeding, or neurologic sequela. The patient is tolerating medications without difficulties.  Overall she is doing well.  She has not had much in the way of issues with dialysis other than cramping.  He says that her blood pressure is always high.  It is high with dialysis as well.   Past Medical History:  Diagnosis Date  . Anemia    low iron  . Anxiety   . Arthritis   . Bronchitis   . CHF (congestive heart failure) ([place])   . Coronary artery disease   . Depression   . Diabetes mellitus without complication ([place])   . Diet-controlled diabetes mellitus  ([place])   . Dyspnea    SOME   . Dysrhythmia    PAF  . ESRD (end stage renal disease) ([place]) 03/16/2017   MWF- [facility]  . Fibromyalgia   . Ganglion cyst    left wrist  . GERD (gastroesophageal reflux disease)   . Headache    chronic headaches- in the past (07/14/2019)  . Heart failure ([place])   . Heart murmur   . Hyperlipidemia   . Hypertension   . Hypothyroidism   . Mitral regurgitation   . Myocardial infarction ([place])    3x last one 2008  . PAF (paroxysmal atrial fibrillation) ([place])   . Pneumonia 2020   x 2  . S/P Maze operation for atrial fibrillation 02/18/2018   Complete bilateral atrial lesion set using bipolar radiofrequency and cryothermy ablation with clipping of LA appendage  . S/P mitral valve replacement with bioprosthetic valve 02/18/2018   [facility] Mitral stented bovine pericardial tissue valve Model 7300 TFX Serial # [id number] Size 29  . Stroke [facility])    no lasting residual - ? 2014  . Umbilical hernia    Past Surgical History:  Procedure Laterality Date  . ABDOMINAL HYSTERECTOMY     PARTIALS  . AV FISTULA PLACEMENT Left 04/23/2018   Procedure: LEFT ARTERIOVENOUS (AV) GRAFT CREATION;  Surgeon: Marty Heck, MD;  Location: [place];  Service: Vascular;  Laterality: Left;  . AV FISTULA PLACEMENT Right 07/15/2019   Procedure: ARTERIOVENOUS (AV) FISTULA CREATION;  Surgeon: Marty Heck, MD;  Location: [place];  Service: Vascular;  Laterality: Right;  . BIOPSY  09/06/2019   Procedure: BIOPSY;  Surgeon: [name], MD;  Location: [facility] ENDOSCOPY;  Service: Gastroenterology;;  . BIV PACEMAKER INSERTION CRT-P N/A 02/26/2018   Procedure: BIV PACEMAKER INSERTION CRT-P;  Surgeon: Constance Haw, MD;  Location: [place] CV LAB;  Service: Cardiovascular;  Laterality: N/A;  . CARDIAC CATHETERIZATION    . CESAREAN SECTION    . COLONOSCOPY    . COLONOSCOPY WITH PROPOFOL N/A 09/06/2019   Procedure: COLONOSCOPY WITH PROPOFOL;  Surgeon: [name], MD;  Location: [place];  Service: Gastroenterology;  Laterality: N/A;  . CORONARY ARTERY BYPASS GRAFT N/A 02/18/2018   Procedure: CORONARY ARTERY BYPASS GRAFTING (CABG) WITH IMA. ENDOSCOPIC VEIN HARVEST. IMA TO LAD, SVG TO CIRC.;  Surgeon: Rexene Alberts, MD;  Location: [place];  Service: Open Heart Surgery;  Laterality: N/A;  . ESOPHAGOGASTRODUODENOSCOPY (EGD) WITH PROPOFOL N/A 09/06/2019   Procedure: ESOPHAGOGASTRODUODENOSCOPY (EGD) WITH PROPOFOL;  Surgeon: [name], MD;  Location: [place];  Service: Gastroenterology;  Laterality: N/A;  . HEMOSTASIS CLIP PLACEMENT  09/06/2019   Procedure: HEMOSTASIS CLIP PLACEMENT;  Surgeon: [name], MD;  Location: [place];  Service: Gastroenterology;;  . HOT HEMOSTASIS N/A 09/06/2019   Procedure: HOT HEMOSTASIS (ARGON PLASMA COAGULATION/BICAP);  Surgeon: [name], MD;  Location: [facility];  Service: Gastroenterology;  Laterality: N/A;  . INSERTION OF DIALYSIS CATHETER N/A 02/18/2018   Procedure: PLACEMENT OF TEMPORARY HD CATHETER, POWER TRIALYSIS 13FR 20CM;  Surgeon: Rexene Alberts, MD;  Location: [place];  Service: Vascular;  Laterality: N/A;  . INSERTION OF DIALYSIS CATHETER Right 04/23/2018   Procedure: INSERTION OF DIALYSIS CATHETER;  Surgeon: Marty Heck, MD;  Location: [facility] [place];  Service: Vascular;  Laterality: Right;  . INSERTION OF DIALYSIS CATHETER Right 03/01/2019   Procedure: INSERTION OF DIALYSIS CATHETER RIGHT Femoral;  Surgeon: Elam Dutch, MD;  Location: [place];  Service: Vascular;  Laterality: Right;  . IR NEPHRO TUBE REMOV/FL  04/02/2017  . IR NEPHROSTOGRAM RIGHT THRU EXISTING ACCESS  04/02/2017  . IR NEPHROSTOMY PLACEMENT RIGHT  03/27/2017  . IR THORACENTESIS ASP PLEURAL SPACE W/IMG GUIDE  03/24/2017  . LIGATION ARTERIOVENOUS GORTEX GRAFT Left 03/01/2019   Procedure: LIGATION ARTERIOVENOUS GORTEX GRAFT Left arm;  Surgeon: Elam Dutch, MD;  Location: [facility];  Service: Vascular;  Laterality: Left;  Marland Kitchen MAZE N/A 02/18/2018   Procedure:  MAZE;  Surgeon: Rexene Alberts, MD;  Location: [place];  Service: Open Heart Surgery;  Laterality: N/A;  . MITRAL VALVE REPLACEMENT N/A 02/18/2018   Procedure: MITRAL VALVE (MV) REPLACEMENT;  Surgeon: Rexene Alberts, MD;  Location: [facility];  Service: Open Heart Surgery;  Laterality: N/A;  . MULTIPLE EXTRACTIONS WITH ALVEOLOPLASTY N/A 11/05/2017   Procedure: Extraction of tooth #'s 4,5,7,8,9,10,12,14 and 29 with alveoloplasty and gross debridement of remaining teeth;  Surgeon: [name], DDS;  Location: [facility];  Service: Oral Surgery;  Laterality: N/A;  . PATENT FORAMEN OVALE(PFO) CLOSURE N/A 02/18/2018   Procedure: PATENT FORAMEN OVALE (PFO) CLOSURE;  Surgeon: Rexene Alberts, MD;  Location: [place];  Service: Open Heart Surgery;  Laterality: N/A;  . POLYPECTOMY  09/06/2019   Procedure: POLYPECTOMY;  Surgeon: [name], MD;  Location: [place];  Service: Gastroenterology;;  . REMOVAL OF GRAFT Left 04/13/2019   Procedure: REMOVAL OF INFECTED GRAFT LEFT UPPER ARM WITH VEIN PATCH OF LEFT BRACHIAL ARTERY;  Surgeon: Rosetta Posner, MD;  Location: [place];  Service: Vascular;  Laterality: Left;  . RIGHT/LEFT HEART CATH AND CORONARY ANGIOGRAPHY N/A 07/03/2017   Procedure: RIGHT/LEFT HEART CATH AND CORONARY ANGIOGRAPHY;  Surgeon: Larey Dresser, MD;  Location: [place] CV LAB;  Service: Cardiovascular;  Laterality: N/A;  . TEE WITHOUT CARDIOVERSION N/A 10/08/2017   Procedure: TRANSESOPHAGEAL ECHOCARDIOGRAM (TEE);  Surgeon: Larey Dresser, MD;  Location: [place] ENDOSCOPY;  Service: Cardiovascular;  Laterality: N/A;  . TEE WITHOUT CARDIOVERSION N/A 02/18/2018   Procedure: TRANSESOPHAGEAL ECHOCARDIOGRAM (TEE);  Surgeon: Rexene Alberts, MD;  Location: [facility];  Service: Open Heart Surgery;  Laterality: N/A;  . TONSILLECTOMY    . TUBAL LIGATION    . UPPER EXTREMITY VENOGRAPHY Right 06/09/2019   Procedure: UPPER EXTREMITY VENOGRAPHY;  Surgeon: Marty Heck, MD;  Location: [place] CV LAB;   Service: Cardiovascular;  Laterality: Right;     Current Outpatient Medications  Medication Sig Dispense Refill  . acetaminophen (TYLENOL) 500 MG tablet Take 1,500-2,000 mg by mouth 2 (two) times daily as needed for moderate pain or headache.     . albuterol (PROVENTIL HFA;VENTOLIN HFA) 108 (90 Base) MCG/ACT inhaler Inhale 2 puffs into the lungs every 6 (six) hours as needed for wheezing or shortness of breath.    . ALPRAZolam (XANAX) 0.25 MG tablet Take 1 tablet (0.25 mg total) by mouth at bedtime as needed for anxiety. 30 tablet 0  . amLODipine (NORVASC) 10 MG tablet Take 10 mg by mouth at bedtime.    Marland Kitchen atorvastatin (LIPITOR) 80 MG tablet Take 1 tablet (80 mg total) by mouth daily. 30 tablet 3  . AURYXIA 1 GM 210 MG(Fe) tablet Take 210 mg by mouth daily.     Marland Kitchen b complex vitamins tablet Take 1 tablet by mouth at bedtime.    . busPIRone (BUSPAR) 15 MG tablet Take 1 tablet (15 mg total) by mouth 2 (two) times daily. 60 tablet 0  . calcitRIOL (ROCALTROL) 0.5 MCG capsule Take 1 capsule (0.5 mcg total) by mouth every Monday, Wednesday, and Friday with hemodialysis.    . cholecalciferol (VITAMIN D) 1000 units tablet Take 1,000 Units by mouth at bedtime.     Marland Kitchen dextromethorphan-guaiFENesin (MUCINEX DM) 30-600 MG 12hr tablet Take 1 tablet by mouth 2 (two) times daily as needed for cough. 60 tablet 0  . diphenhydrAMINE (BENADRYL) 25 MG tablet Take 1 tablet (25 mg total) by mouth daily as needed for itching. 30 tablet 0  . doxazosin (CARDURA) 4 MG tablet Take 4-8 mg by mouth See admin instructions. Take one tablet (4 mg) by mouth every morning and two tablets (8 mg) at night    . gabapentin (NEURONTIN) 300 MG capsule Take 1 capsule (300 mg total) by mouth 3 (three) times a week. After dialysis on M/W/F 12 capsule 1  . heparin 1000 UNIT/ML injection 1 mL (1,000 Units total) by Intracatheter route every Monday, Wednesday, and Friday with hemodialysis. 1 mL   . hydrALAZINE (APRESOLINE) 100 MG tablet Take 1  tablet (100 mg total) by mouth 3 (three) times daily. 90 tablet 0  . isosorbide mononitrate (IMDUR) 120 MG 24 hr tablet Take 1 tablet (120 mg total) by mouth at bedtime. 30 tablet 0  . levothyroxine (SYNTHROID) 100 MCG tablet Take 100 mcg by mouth daily before breakfast.    . mirtazapine (REMERON) 15 MG tablet Take 15 mg by mouth at bedtime.    . montelukast (SINGULAIR) 10 MG tablet Take 1 tablet (10 mg total) by mouth daily. 30 tablet 0  . multivitamin (RENA-VIT)  TABS tablet Take 1 tablet by mouth at bedtime.  0  . nitroGLYCERIN (NITROSTAT) 0.4 MG SL tablet Place 0.4 mg under the tongue every 5 (five) minutes as needed for chest pain.    . Nutritional Supplements (FEEDING SUPPLEMENT, NEPRO CARB STEADY,) LIQD Take 237 mLs by mouth 3 (three) times daily between meals.  0  . pantoprazole (PROTONIX) 40 MG tablet Take 1 tablet (40 mg total) by mouth 2 (two) times daily before a meal. 60 tablet 0  . polyethylene glycol (MIRALAX / GLYCOLAX) packet Take 17 g by mouth daily. 14 each 0  . traZODone (DESYREL) 100 MG tablet Take 100 mg by mouth at bedtime.    Marland Kitchen warfarin (COUMADIN) 3 MG tablet Take 1 tablet (3 mg total) by mouth daily at 6 PM. 30 tablet 0   No current facility-administered medications for this visit.    Allergies:   Ace inhibitors and Motrin ib [ibuprofen]   Social History:  The patient  reports that she quit smoking about 41 years ago. Her smoking use included cigarettes. She has never used smokeless tobacco. She reports previous drug use. She reports that she does not drink alcohol.   Family History:  The patient's family history includes Hypertension in her mother.    ROS:  Please see the history of present illness.   Otherwise, review of systems is positive for none.   All other systems are reviewed and negative.   PHYSICAL EXAM: VS:  BP (!) 172/76   Pulse 70   Ht 5\' 9"  (1.753 m)   Wt 124 lb (56.2 kg)   SpO2 100%   BMI 18.31 kg/m  , BMI Body mass index is 18.31 kg/m. GEN:  Well nourished, well developed, in no acute distress  HEENT: normal  Neck: no JVD, carotid bruits, or masses Cardiac: RRR; no murmurs, rubs, or gallops,no edema  Respiratory:  clear to auscultation bilaterally, normal work of breathing GI: soft, nontender, nondistended, + BS MS: no deformity or atrophy  Skin: warm and dry, device site well healed Neuro:  Strength and sensation are intact Psych: euthymic mood, full affect  EKG:  EKG is ordered today. Personal review of the ekg ordered shows atrial sensed, ventricular paced  Personal review of the device interrogation today. Results in [facility]: 09/04/2019: ALT 15 09/08/2019: BUN 16; Creatinine, Ser 3.48; Hemoglobin 9.7; Platelets 155; Potassium 3.5; Sodium 140    Lipid Panel     Component Value Date/Time   CHOL 89 04/02/2018 1457   TRIG 63 04/02/2018 1457   HDL 46 04/02/2018 1457   CHOLHDL 1.9 04/02/2018 1457   VLDL 13 04/02/2018 1457   LDLCALC 30 04/02/2018 1457     Wt Readings from Last 3 Encounters:  10/17/19 124 lb (56.2 kg)  09/08/19 114 lb 11.2 oz (52 kg)  08/30/19 120 lb (54.4 kg)      Other studies Reviewed: Additional studies/ records that were reviewed today include: TTE 02/22/18  Review of the above records today demonstrates:  - Left ventricle: The cavity size was normal. Wall thickness was   increased in a pattern of mild LVH. Global hypokinesis with   incoordinate septal motion. Systolic function was mildly reduced.   The estimated ejection fraction was in the range of 45% to 50%.   The study is not technically sufficient to allow evaluation of LV   diastolic function. - Mitral valve: Bioprosthetic MVR. No obstruction. Mobile   subvalvular density - may be ruptured cord  or less likely   thrombus. Mean gradient (D): 3 mm Hg. Valve area by continuity   equation (using LVOT flow): 1.87 cm^2. - Left atrium: Severely dilated. - Right ventricle: The cavity size was mildly dilated. Mildly    reduced systolic function. - Right atrium: Moderately dilated. - Atrial septum: No defect or patent foramen ovale was identified.   s/p PFO closure. - Tricuspid valve: There was moderate regurgitation. - Pulmonary arteries: PA peak pressure: 32 mm Hg (S). - Pericardium, extracardiac: A trivial pericardial effusion was   identified posterior to the heart. Features were not consistent   with tamponade physiology. There was a left pleural effusion.   ASSESSMENT AND PLAN:  1.  Complete heart block: Status post Saint Jude CRT-P implanted 02/26/2018.  Device functioning appropriately.  No changes.  2.  Chronic diastolic heart failure: Ejection fraction 45 to 50%.  No obvious volume overload.  3.  Mitral regurgitation, severe: Status post MVR/maze procedure 12/19/2017.  Plan per primary cardiology.  4.  Paroxysmal atrial fibrillation: Currently on Eliquis.  Status post maze.  CHA2DS2-VASc of 4.  Patient remains in sinus rhythm.  5.  Coronary artery disease: Status post two-vessel CABG.  No current chest pain.  6.  Hypertension: Blood pressure significantly elevated today.  Isabeau Mccalla increase carvedilol to 25 mg.  Current medicines are reviewed at length with the patient today.   The patient does not have concerns regarding her medicines.  The following changes were made today: Increase carvedilol   Labs/ tests ordered today include:  Orders Placed This Encounter  Procedures  . EKG 12-Lead     Disposition:   FU with Seng Larch 12 months  Signed, Terra Aveni Meredith Leeds, MD  10/17/2019 2:28 PM     [address] [place] [address] [phone] (office) [phone] (fax)

## 2019-10-25 ENCOUNTER — Ambulatory Visit (INDEPENDENT_AMBULATORY_CARE_PROVIDER_SITE_OTHER): Payer: Medicare Other | Admitting: *Deleted

## 2019-10-25 DIAGNOSIS — I442 Atrioventricular block, complete: Secondary | ICD-10-CM

## 2019-10-25 LAB — CUP PACEART REMOTE DEVICE CHECK
Battery Remaining Longevity: 89 mo
Battery Remaining Percentage: 95.5 %
Battery Voltage: 2.98 V
Brady Statistic AP VP Percent: 47 %
Brady Statistic AP VS Percent: 1 %
Brady Statistic AS VP Percent: 52 %
Brady Statistic AS VS Percent: 1 %
Brady Statistic RA Percent Paced: 46 %
Date Time Interrogation Session: 20210427020011
Implantable Lead Implant Date: 20190830
Implantable Lead Implant Date: 20190830
Implantable Lead Implant Date: 20190830
Implantable Lead Location: 753858
Implantable Lead Location: 753859
Implantable Lead Location: 753860
Implantable Pulse Generator Implant Date: 20190830
Lead Channel Impedance Value: 390 Ohm
Lead Channel Impedance Value: 410 Ohm
Lead Channel Impedance Value: 690 Ohm
Lead Channel Pacing Threshold Amplitude: 0.5 V
Lead Channel Pacing Threshold Amplitude: 0.75 V
Lead Channel Pacing Threshold Amplitude: 1.375 V
Lead Channel Pacing Threshold Pulse Width: 0.5 ms
Lead Channel Pacing Threshold Pulse Width: 0.5 ms
Lead Channel Pacing Threshold Pulse Width: 0.8 ms
Lead Channel Sensing Intrinsic Amplitude: 12 mV
Lead Channel Sensing Intrinsic Amplitude: 2.2 mV
Lead Channel Setting Pacing Amplitude: 1.5 V
Lead Channel Setting Pacing Amplitude: 2 V
Lead Channel Setting Pacing Amplitude: 2.375
Lead Channel Setting Pacing Pulse Width: 0.5 ms
Lead Channel Setting Pacing Pulse Width: 0.8 ms
Lead Channel Setting Sensing Sensitivity: 2 mV
Pulse Gen Model: 3562
Pulse Gen Serial Number: 9043873

## 2019-10-25 NOTE — Progress Notes (Signed)
PPM Remote  

## 2019-10-26 ENCOUNTER — Other Ambulatory Visit: Payer: Self-pay

## 2019-11-01 ENCOUNTER — Other Ambulatory Visit (HOSPITAL_COMMUNITY)
Admission: RE | Admit: 2019-11-01 | Discharge: 2019-11-01 | Disposition: A | Discharge status: Home / Self Care | Payer: Medicare Other | Source: Ambulatory Visit | Attending: Vascular Surgery | Admitting: Vascular Surgery

## 2019-11-01 DIAGNOSIS — Z01812 Encounter for preprocedural laboratory examination: Secondary | ICD-10-CM | POA: Diagnosis present

## 2019-11-01 DIAGNOSIS — Z20822 Contact with and (suspected) exposure to covid-19: Secondary | ICD-10-CM | POA: Insufficient documentation

## 2019-11-01 LAB — SARS CORONAVIRUS 2 (TAT 6-24 HRS): SARS Coronavirus 2: NEGATIVE

## 2019-11-02 ENCOUNTER — Encounter (HOSPITAL_COMMUNITY): Payer: Self-pay | Admitting: Vascular Surgery

## 2019-11-02 ENCOUNTER — Encounter: Payer: Self-pay | Admitting: Cardiology

## 2019-11-02 NOTE — Anesthesia Preprocedure Evaluation (Addendum)
Anesthesia Evaluation  Patient identified by MRN, date of birth, ID band Patient awake    Reviewed: Allergy & Precautions, H&P , NPO status , Patient's Chart, lab work & pertinent test results  History of Anesthesia Complications Negative for: history of anesthetic complications  Airway Mallampati: II  TM Distance: >3 FB Neck ROM: Full    Dental  (+) Poor Dentition, Dental Advisory Given   Pulmonary shortness of breath, former smoker,    Pulmonary exam normal        Cardiovascular hypertension, + CAD, + Past MI, + CABG and +CHF  Normal cardiovascular exam+ dysrhythmias Atrial Fibrillation + pacemaker + Valvular Problems/Murmurs   S/p MAZE for AF. S/p MVR CABG (2019) Echo 04/18/18: Study Conclusions - Left ventricle: The cavity size was normal. There was severe concentric hypertrophy. Systolic function was moderately to severely reduced. The estimated ejection fraction was in the range of 30% to 35%. Features are consistent with a pseudonormal left ventricular filling pattern, with concomitant abnormal relaxation and increased filling pressure (grade 2 diastolic dysfunction). Doppler parameters are consistent with elevated ventricular end-diastolic filling pressure. - Mitral valve: S/P MVR with a bioprosthetic valve. Mean gradient (D): 5 mm Hg. Valve area by continuity equation (using LVOT flow): 1.06 cm^2. - Right ventricle: Systolic function was normal. - Right atrium: The atrium was normal in size. - Pulmonic valve: There was mild regurgitation. - Pulmonary arteries: Systolic pressure was within the normal range. - Pericardium, extracardiac: A trivial pericardial effusion was identified posterior to the heart. Features were not consistent with tamponade physiology. There was a large left pleural effusion. Impressions: - LVEF is 30-35% with diffuse hypokinesis and significant paradoxical  septal motion and interventricular dyssynchrony. LVEDP is severely elevated with E/E&' =32. LVEF is unchanged from the prior echocardiogram from 02/22/2018.     Neuro/Psych  Headaches, PSYCHIATRIC DISORDERS Anxiety Depression CVA    GI/Hepatic GERD  ,  Endo/Other  diabetes, Type 2Hypothyroidism   Renal/GU ESRFRenal disease     Musculoskeletal  (+) Arthritis , Fibromyalgia -  Abdominal   Peds  Hematology   Anesthesia Other Findings She was last evaluated by cardiologist Dr. Curt Bears on 10/17/19. Jocelyn Hill CRT-P functioning appropriately. She was maintaining SR. Volume status okay and no chest pain. 12 month follow-up planned.   PAT RN still attempting to reach patient to verify perioperative warfarin instructions. Warfarin held for three days for previous HD access procedure.  She is a same day work-up, so she will get labs and anesthesia team evaluation on the day of surgery. She has a Event organiser [phone] [employer] PACEMAKER. Per perioperative device form: Manufacturer and Phone #:St. Jude/Abbott: [phone] Pacemaker Dependent?:No. Date of Last Device Check:4/19/21Normal Device Function?:Yes. Electrophysiologist's Recommendations:  Have magnet available.  Provide continuous ECG monitoring when magnet is used or reprogramming is to be performed.  Procedure may interfere with device function. Magnet should be placed over device during procedure.   Reproductive/Obstetrics                           Anesthesia Physical  Anesthesia Plan  ASA: IV  Anesthesia Plan: General   Post-op Pain Management:    Induction: Intravenous  PONV Risk Score and Plan: 3 and Ondansetron, Treatment may vary due to age or medical condition, TIVA and Dexamethasone  Airway Management Planned: LMA  Additional Equipment: None  Intra-op Plan:   Post-operative Plan: Extubation in OR  Informed Consent: I have reviewed the  patients History and Physical, chart, labs and discussed the procedure including the risks, benefits and alternatives for the proposed anesthesia with the patient or authorized representative who has indicated his/her understanding and acceptance.     Dental advisory given  Plan Discussed with: Anesthesiologist and CRNA  Anesthesia Plan Comments: ( \9.)     Anesthesia Quick Evaluation

## 2019-11-02 NOTE — Anesthesia Preprocedure Evaluation (Deleted)
Anesthesia Evaluation    Airway        Dental   Pulmonary former smoker,           Cardiovascular hypertension,      Neuro/Psych    GI/Hepatic   Endo/Other  diabetes  Renal/GU      Musculoskeletal   Abdominal   Peds  Hematology   Anesthesia Other Findings   Reproductive/Obstetrics                             Anesthesia Physical Anesthesia Plan  ASA:   Anesthesia Plan:    Post-op Pain Management:    Induction:   PONV Risk Score and Plan:   Airway Management Planned:   Additional Equipment:   Intra-op Plan:   Post-operative Plan:   Informed Consent:   Plan Discussed with:   Anesthesia Plan Comments: (PAT note written 11/02/2019 by Myra Gianotti, PA-C. )        Anesthesia Quick Evaluation

## 2019-11-02 NOTE — Progress Notes (Signed)
Anesthesia Chart Review: Jocelyn Hill   Case: [id number] Date/Time: 11/03/19 1045   Procedure: RIGHT SECOND STAGE BASCILIC VEIN TRANSPOSITION (Right )   Anesthesia type: Monitor Anesthesia Care   Pre-op diagnosis: END STAGE RENAL DISEASE   Location: [facility] OR ROOM 11 / [facility]   Surgeons: Marty Heck, MD      DISCUSSION: Patient is a 69 year old female scheduled for the above procedure. Surgery was initially scheduled for 09/15/19, but she had admission for GI bleed (clots from rectum) 09/04/19-09/08/19. INR 3.7 and given vit K and FFP. HGB 7.6, s/p 2 units PRBC (total) GI consulted for colonoscopy and EGD (see below) and had small AVM s/p APC/endoclip [name], [name], MD). She was rescheduled for 09/23/19, but woke up late and care was out of gas, so surgery rescheduled for 11/03/19.  She has a tunneled right femoral catheter.   History includes former smoker, ESRD (HD MWF, [place]; last access 07/15/19: right first stage BVT), CAD/severe MR/PFO/PAF (MI x2 prior to 2019 reported; s/p MV replacement 29 mm Bovine bioprosthetic valve; MAZE procedure with clipping of LA appendage, CABG x2: LIMA-LAD, SVG-distal LCx, closure of PFO 02/18/18), complete heart block (s/p St. Jude BiV PPM 02/26/18), combined chronic CHF, HTN, HLD, DM2 (diet controlled), CVA (~ 2014), anemia, hypothyroidism. She is on warfarin for afib.   She was last evaluated by cardiologist Dr. Curt Bears on 10/17/19. [name] CRT-P functioning appropriately. She was maintaining SR. Volume status okay and no chest pain. 12 month follow-up planned.   PAT RN still attempting to reach patient to verify perioperative warfarin instructions. Warfarin held for three days for previous HD access procedure.  She is a same day work-up, so she will get labs and anesthesia team evaluation on the day of surgery. She has a Event organiser [phone] [employer] PACEMAKER. Per perioperative device form: Manufacturer and Phone #:  St. Jude/Abbott:  [phone] Pacemaker Dependent?:  No. Date of Last Device Check:  10/17/19           Normal Device Function?:  Yes.   Electrophysiologist's Recommendations:  Have magnet available.  Provide continuous ECG monitoring when magnet is used or reprogramming is to be performed.   Procedure may interfere with device function.  Magnet should be placed over device during procedure.  11/01/2019 preoperative COVID-19 test negative.  She is for labs and anesthesia team evaluation on the day of surgery.   VS:  BP Readings from Last 3 Encounters:  10/17/19 (!) 172/76  09/08/19 132/67  08/30/19 135/66   Pulse Readings from Last 3 Encounters:  10/17/19 70  09/08/19 81  08/30/19 69    PROVIDERS: [name], MDis listed as PCP ([facility]) [facility] provides nephrology care [name], MD is HF cardiologist. Last seen by [name], NP on 05/05/18.  [name], MD is EP cardiologist. Last evaluation 10/17/19. [name], MD is CT surgeon - Reportedly she has a primary GI in [place]   LABS: For day of surgery. As of 09/08/19, H/H 9.7/29.0, PLT 155, glucose 118, Cr 3.48.   OTHER: Colonoscopy 09/06/19: - A single non-bleeding colonic angioectasia. Treated with argon plasma coagulation (APC). Clips (MR conditional) were placed. - One 4 mm polyp in the ascending colon, removed with a cold biopsy forceps. Resected and retrieved. - Two 3 to 4 mm polyps in the transverse colon, removed with a cold biopsy forceps. Resected and retrieved. - Multiple 3 to 7 mm polyps in the rectum, removed with  a hot snare. Resected and retrieved. - Diverticulosis in the sigmoid colon, in the descending colon, in the transverse colon, at the hepatic flexure and in the ascending colon. - The examination was otherwise normal on direct and retroflexion views. It was difficult to insufflate the rectum and sigmoid despite manually trying to hold pressure by  closing the rectum with hands. Edematous folds were noted throughout the colon.  EGD 09/06/19: - Normal esophagus. - Z-line regular, 36 cm from the incisors. - Normal stomach. - Normal examined duodenum. - No specimens collected.  PFTs 12/21/17: FVC 2.50 (81%), FEV1 1.76 (73%), DLCO unc 16.62 (53%), cor 17.48 (56%).   IMAGES: 1V PCXR 09/04/19: FINDINGS: There is a multi lead left-sided pacemaker in place. The heart size is enlarged. The patient is status post prior median sternotomy, valve replacement, and atrial appendage clip placement. There is no pneumothorax. There is vascular congestion with early developing pulmonary edema. There is no large pleural effusion. There is no acute osseous abnormality. No large focal infiltrate. Aortic calcifications are noted. IMPRESSION: Cardiomegaly with vascular congestion and early developing pulmonary edema.   EKG: 10/17/19 ([facility]):   Atrial-sensed ventricular-paced rhythm with frequent AV dual paced complexes and with occasional premature ventricular complexes Biventricular pacemaker detected Abnormal ECG   CV: Echo 04/18/18: Study Conclusions - Left ventricle: The cavity size was normal. There was severe concentric hypertrophy. Systolic function was moderately to severely reduced. The estimated ejection fraction was in the range of 30% to 35%. Features are consistent with a pseudonormal left ventricular filling pattern, with concomitant abnormal relaxation and increased filling pressure (grade 2 diastolic dysfunction). Doppler parameters are consistent with elevated ventricular end-diastolic filling pressure. - Mitral valve: S/P MVR with a bioprosthetic valve. Mean gradient (D): 5 mm Hg. Valve area by continuity equation (using LVOT flow): 1.06 cm^2. - Right ventricle: Systolic function was normal. - Right atrium: The atrium was normal in size. - Pulmonic valve: There was mild regurgitation. -  Pulmonary arteries: Systolic pressure was within the normal range. - Pericardium, extracardiac: A trivial pericardial effusion was identified posterior to the heart. Features were not consistent with tamponade physiology. There was a large left pleural effusion. Impressions: - LVEF is 30-35% with diffuse hypokinesis and significant paradoxical septal motion and interventricular dyssynchrony. LVEDP is severely elevated with E/E&' =32. LVEF is unchanged from the prior echocardiogram from 02/22/2018.   Carotid US 12/21/17: Final Interpretation: -Right Carotid: Velocities in the right ICA are consistent with a 1-39% stenosis. -Left Carotid: Velocities in the left ICA are consistent with a 1-39% stenosis. -Vertebrals: Bilateral vertebral arteries demonstrate antegrade flow. -Subclavians: Normal flow hemodynamics were seen in bilateral subclavian arteries.   Cardiac cath 07/03/17(pre-MVR/CABG): Conclusion: 1. Low filling pressure and preserved cardiac output.  2. 90% ostial LCx stenosis.  3. Napkin ring-like stenosis in the proximal LAD, at least 60% stenosis -Patient will have MV surgery. She will need CABG with grafting of LCx +/- LAD.   Past Medical History:  Diagnosis Date  . Anemia    low iron  . Anxiety   . Arthritis   . Bronchitis   . CHF (congestive heart failure) ([place])   . Coronary artery disease   . Depression   . Diabetes mellitus without complication ([place])   . Diet-controlled diabetes mellitus ([place])   . Dyspnea    SOME   . Dysrhythmia    PAF  . ESRD (end stage renal disease) ([place]) 03/16/2017   MWF- [facility]  . Fibromyalgia   . Ganglion cyst  left wrist  . GERD (gastroesophageal reflux disease)   . Headache    chronic headaches- in the past (07/14/2019)  . Heart failure ([place])   . Heart murmur   . Hyperlipidemia   . Hypertension   . Hypothyroidism   . Mitral regurgitation   . Myocardial infarction ([place])    3x last one 2008  .  PAF (paroxysmal atrial fibrillation) ([place])   . Pneumonia 2020   x 2  . Presence of permanent cardiac pacemaker 2019   St Jude BIV Pacer  . S/P Maze operation for atrial fibrillation 02/18/2018   Complete bilateral atrial lesion set using bipolar radiofrequency and cryothermy ablation with clipping of LA appendage  . S/P mitral valve replacement with bioprosthetic valve 02/18/2018   [facility] Mitral stented bovine pericardial tissue valve Model 7300 TFX Serial # [id number] Size 29  . Stroke [facility])    no lasting residual - ? 2014  . Umbilical hernia     Past Surgical History:  Procedure Laterality Date  . ABDOMINAL HYSTERECTOMY     PARTIALS  . AV FISTULA PLACEMENT Left 04/23/2018   Procedure: LEFT ARTERIOVENOUS (AV) GRAFT CREATION;  Surgeon: Marty Heck, MD;  Location: [place];  Service: Vascular;  Laterality: Left;  . AV FISTULA PLACEMENT Right 07/15/2019   Procedure: ARTERIOVENOUS (AV) FISTULA CREATION;  Surgeon: Marty Heck, MD;  Location: [place];  Service: Vascular;  Laterality: Right;  . BIOPSY  09/06/2019   Procedure: BIOPSY;  Surgeon: [name], MD;  Location: [facility] ENDOSCOPY;  Service: Gastroenterology;;  . BIV PACEMAKER INSERTION CRT-P N/A 02/26/2018   Procedure: BIV PACEMAKER INSERTION CRT-P;  Surgeon: Constance Haw, MD;  Location: [place] CV LAB;  Service: Cardiovascular;  Laterality: N/A;  . CARDIAC CATHETERIZATION    . CESAREAN SECTION    . COLONOSCOPY    . COLONOSCOPY WITH PROPOFOL N/A 09/06/2019   Procedure: COLONOSCOPY WITH PROPOFOL;  Surgeon: [name], MD;  Location: [place];  Service: Gastroenterology;  Laterality: N/A;  . CORONARY ARTERY BYPASS GRAFT N/A 02/18/2018   Procedure: CORONARY ARTERY BYPASS GRAFTING (CABG) WITH IMA. ENDOSCOPIC VEIN HARVEST. IMA TO LAD, SVG TO CIRC.;  Surgeon: Rexene Alberts, MD;  Location: [place];  Service: Open Heart Surgery;  Laterality: N/A;  . ESOPHAGOGASTRODUODENOSCOPY (EGD) WITH PROPOFOL N/A 09/06/2019    Procedure: ESOPHAGOGASTRODUODENOSCOPY (EGD) WITH PROPOFOL;  Surgeon: [name], MD;  Location: [place];  Service: Gastroenterology;  Laterality: N/A;  . HEMOSTASIS CLIP PLACEMENT  09/06/2019   Procedure: HEMOSTASIS CLIP PLACEMENT;  Surgeon: [name], MD;  Location: [place];  Service: Gastroenterology;;  . HOT HEMOSTASIS N/A 09/06/2019   Procedure: HOT HEMOSTASIS (ARGON PLASMA COAGULATION/BICAP);  Surgeon: [name], MD;  Location: [place];  Service: Gastroenterology;  Laterality: N/A;  . INSERTION OF DIALYSIS CATHETER N/A 02/18/2018   Procedure: PLACEMENT OF TEMPORARY HD CATHETER, POWER TRIALYSIS 13FR 20CM;  Surgeon: Rexene Alberts, MD;  Location: [place];  Service: Vascular;  Laterality: N/A;  . INSERTION OF DIALYSIS CATHETER Right 04/23/2018   Procedure: INSERTION OF DIALYSIS CATHETER;  Surgeon: Marty Heck, MD;  Location: [place];  Service: Vascular;  Laterality: Right;  . INSERTION OF DIALYSIS CATHETER Right 03/01/2019   Procedure: INSERTION OF DIALYSIS CATHETER RIGHT Femoral;  Surgeon: Elam Dutch, MD;  Location: [place];  Service: Vascular;  Laterality: Right;  . IR NEPHRO TUBE REMOV/FL  04/02/2017  . IR NEPHROSTOGRAM RIGHT THRU EXISTING ACCESS  04/02/2017  . IR NEPHROSTOMY PLACEMENT RIGHT  03/27/2017  .  IR THORACENTESIS ASP PLEURAL SPACE W/IMG GUIDE  03/24/2017  . LIGATION ARTERIOVENOUS GORTEX GRAFT Left 03/01/2019   Procedure: LIGATION ARTERIOVENOUS GORTEX GRAFT Left arm;  Surgeon: Elam Dutch, MD;  Location: [place];  Service: Vascular;  Laterality: Left;  Marland Kitchen MAZE N/A 02/18/2018   Procedure: MAZE;  Surgeon: Rexene Alberts, MD;  Location: [place];  Service: Open Heart Surgery;  Laterality: N/A;  . MITRAL VALVE REPLACEMENT N/A 02/18/2018   Procedure: MITRAL VALVE (MV) REPLACEMENT;  Surgeon: Rexene Alberts, MD;  Location: [place];  Service: Open Heart Surgery;  Laterality: N/A;  . MULTIPLE EXTRACTIONS WITH ALVEOLOPLASTY N/A 11/05/2017   Procedure: Extraction of tooth  #'s 4,5,7,8,9,10,12,14 and 29 with alveoloplasty and gross debridement of remaining teeth;  Surgeon: [name], DDS;  Location: [place];  Service: Oral Surgery;  Laterality: N/A;  . PATENT FORAMEN OVALE(PFO) CLOSURE N/A 02/18/2018   Procedure: PATENT FORAMEN OVALE (PFO) CLOSURE;  Surgeon: Rexene Alberts, MD;  Location: [place];  Service: Open Heart Surgery;  Laterality: N/A;  . POLYPECTOMY  09/06/2019   Procedure: POLYPECTOMY;  Surgeon: [name], MD;  Location: [place];  Service: Gastroenterology;;  . REMOVAL OF GRAFT Left 04/13/2019   Procedure: REMOVAL OF INFECTED GRAFT LEFT UPPER ARM WITH VEIN PATCH OF LEFT BRACHIAL ARTERY;  Surgeon: Rosetta Posner, MD;  Location: [place];  Service: Vascular;  Laterality: Left;  . RIGHT/LEFT HEART CATH AND CORONARY ANGIOGRAPHY N/A 07/03/2017   Procedure: RIGHT/LEFT HEART CATH AND CORONARY ANGIOGRAPHY;  Surgeon: Larey Dresser, MD;  Location: [place] CV LAB;  Service: Cardiovascular;  Laterality: N/A;  . TEE WITHOUT CARDIOVERSION N/A 10/08/2017   Procedure: TRANSESOPHAGEAL ECHOCARDIOGRAM (TEE);  Surgeon: Larey Dresser, MD;  Location: [facility] ENDOSCOPY;  Service: Cardiovascular;  Laterality: N/A;  . TEE WITHOUT CARDIOVERSION N/A 02/18/2018   Procedure: TRANSESOPHAGEAL ECHOCARDIOGRAM (TEE);  Surgeon: Rexene Alberts, MD;  Location: [place];  Service: Open Heart Surgery;  Laterality: N/A;  . TONSILLECTOMY    . TUBAL LIGATION    . UPPER EXTREMITY VENOGRAPHY Right 06/09/2019   Procedure: UPPER EXTREMITY VENOGRAPHY;  Surgeon: Marty Heck, MD;  Location: [place] CV LAB;  Service: Cardiovascular;  Laterality: Right;    MEDICATIONS: No current facility-administered medications for this encounter.   Marland Kitchen acetaminophen (TYLENOL) 500 MG tablet  . albuterol (PROVENTIL HFA;VENTOLIN HFA) 108 (90 Base) MCG/ACT inhaler  . ALPRAZolam (XANAX) 0.25 MG tablet  . amLODipine (NORVASC) 10 MG tablet  . atorvastatin (LIPITOR) 80 MG tablet  . AURYXIA 1 GM 210  MG(Fe) tablet  . b complex vitamins tablet  . busPIRone (BUSPAR) 15 MG tablet  . calcitRIOL (ROCALTROL) 0.5 MCG capsule  . carvedilol (COREG) 25 MG tablet  . cholecalciferol (VITAMIN D) 1000 units tablet  . dextromethorphan-guaiFENesin (MUCINEX DM) 30-600 MG 12hr tablet  . diphenhydrAMINE (BENADRYL) 25 MG tablet  . doxazosin (CARDURA) 4 MG tablet  . gabapentin (NEURONTIN) 300 MG capsule  . heparin 1000 UNIT/ML injection  . hydrALAZINE (APRESOLINE) 100 MG tablet  . isosorbide mononitrate (IMDUR) 120 MG 24 hr tablet  . levothyroxine (SYNTHROID) 100 MCG tablet  . mirtazapine (REMERON) 15 MG tablet  . montelukast (SINGULAIR) 10 MG tablet  . multivitamin (RENA-VIT) TABS tablet  . nitroGLYCERIN (NITROSTAT) 0.4 MG SL tablet  . pantoprazole (PROTONIX) 40 MG tablet  . polyethylene glycol (MIRALAX / GLYCOLAX) packet  . traZODone (DESYREL) 100 MG tablet  . warfarin (COUMADIN) 3 MG tablet  . Nutritional Supplements (FEEDING SUPPLEMENT, NEPRO CARB STEADY,)  LIQD    Myra Gianotti, PA-C Surgical Short Stay/Anesthesiology [facility] Phone [phone] [facility] Phone [phone] 11/02/2019 4:06 PM

## 2019-11-02 NOTE — Progress Notes (Signed)
[place] DEVICE PROGRAMMING  Patient Information: Name:  Jocelyn Hill  DOB:  1951/06/03  MRN:  [id number]    Device Information:  Clinic EP Physician:  [name], MD   Device Type:  Pacemaker Manufacturer and Phone #:  St. Jude/Abbott: [phone] Pacemaker Dependent?:  No. Date of Last Device Check:  10/17/19 Normal Device Function?:  Yes.    Electrophysiologist's Recommendations:   Have magnet available.  Provide continuous ECG monitoring when magnet is used or reprogramming is to be performed.   Procedure may interfere with device function.  Magnet should be placed over device during procedure.  Per Device Clinic Standing Orders, Drake Leach, RN  1:46 PM 11/02/2019

## 2019-11-02 NOTE — Progress Notes (Signed)
Patient denies shortness of breath, fever, cough and chest pain.  PCP - Dr Maurine Simmering Cardiologist - Dr Aundra Dubin  Chest x-ray - 09/04/19 (1V) EKG - 10/17/19 Stress Test - n/a ECHO - 04/18/18 Cardiac Cath -  07/03/17  ICD Pacemaker/Loop- Sherlynn Stalls, RN emailed info for [place] and to Rep on 11/01/19 per her note.  Last remote device check was on 10/25/19.  Diabetes Type 2, diet controlled, no meds Fasting Blood Sugar - unknown Checks Blood Sugar 0  times a day  . If your blood sugar is less than 70 mg/dL, you will need to treat for low blood sugar: o Treat a low blood sugar (less than 70 mg/dL) with  cup of clear juice (cranberry or apple), 4 glucose tablets, OR glucose gel. o Recheck blood sugar in 15 minutes after treatment (to make sure it is greater than 70 mg/dL). If your blood sugar is not greater than 70 mg/dL on recheck, call [phone] for further instructions.  Blood Thinner Instructions:  Follow your surgeon's instructions on when to stop prior to surgery.  Coumadin last dose on 10/29/19  Anesthesia review: Yes  STOP now taking any Aspirin (unless otherwise instructed by your surgeon), Aleve, Naproxen, Ibuprofen, Motrin, Advil, Goody's, BC's, all herbal medications, fish oil, and all vitamins.   Coronavirus Screening Covid test on 11/01/19 was negative.  Patient verbalized understanding of instructions that were given via phone.

## 2019-11-03 ENCOUNTER — Ambulatory Visit (HOSPITAL_COMMUNITY): Payer: Medicare Other | Admitting: Vascular Surgery

## 2019-11-03 ENCOUNTER — Telehealth: Payer: Self-pay | Admitting: Vascular Surgery

## 2019-11-03 ENCOUNTER — Encounter (HOSPITAL_COMMUNITY)
Admission: RE | Disposition: A | Discharge status: Home / Self Care | Payer: Self-pay | Source: Home / Self Care | Attending: Vascular Surgery

## 2019-11-03 ENCOUNTER — Ambulatory Visit (HOSPITAL_COMMUNITY)
Admission: RE | Admit: 2019-11-03 | Discharge: 2019-11-03 | Disposition: A | Discharge status: Home / Self Care | Payer: Medicare Other | Attending: Vascular Surgery | Admitting: Vascular Surgery

## 2019-11-03 ENCOUNTER — Encounter (HOSPITAL_COMMUNITY): Payer: Self-pay | Admitting: Vascular Surgery

## 2019-11-03 ENCOUNTER — Other Ambulatory Visit: Payer: Self-pay

## 2019-11-03 DIAGNOSIS — E039 Hypothyroidism, unspecified: Secondary | ICD-10-CM | POA: Insufficient documentation

## 2019-11-03 DIAGNOSIS — I2510 Atherosclerotic heart disease of native coronary artery without angina pectoris: Secondary | ICD-10-CM | POA: Diagnosis not present

## 2019-11-03 DIAGNOSIS — N186 End stage renal disease: Secondary | ICD-10-CM | POA: Diagnosis present

## 2019-11-03 DIAGNOSIS — M797 Fibromyalgia: Secondary | ICD-10-CM | POA: Diagnosis not present

## 2019-11-03 DIAGNOSIS — Z7901 Long term (current) use of anticoagulants: Secondary | ICD-10-CM | POA: Insufficient documentation

## 2019-11-03 DIAGNOSIS — I252 Old myocardial infarction: Secondary | ICD-10-CM | POA: Diagnosis not present

## 2019-11-03 DIAGNOSIS — I132 Hypertensive heart and chronic kidney disease with heart failure and with stage 5 chronic kidney disease, or end stage renal disease: Secondary | ICD-10-CM | POA: Insufficient documentation

## 2019-11-03 DIAGNOSIS — Z87891 Personal history of nicotine dependence: Secondary | ICD-10-CM | POA: Diagnosis not present

## 2019-11-03 DIAGNOSIS — Z992 Dependence on renal dialysis: Secondary | ICD-10-CM | POA: Diagnosis not present

## 2019-11-03 DIAGNOSIS — Z951 Presence of aortocoronary bypass graft: Secondary | ICD-10-CM | POA: Insufficient documentation

## 2019-11-03 DIAGNOSIS — K219 Gastro-esophageal reflux disease without esophagitis: Secondary | ICD-10-CM | POA: Insufficient documentation

## 2019-11-03 DIAGNOSIS — E785 Hyperlipidemia, unspecified: Secondary | ICD-10-CM | POA: Diagnosis not present

## 2019-11-03 DIAGNOSIS — Z79890 Hormone replacement therapy (postmenopausal): Secondary | ICD-10-CM | POA: Diagnosis not present

## 2019-11-03 DIAGNOSIS — Z8774 Personal history of (corrected) congenital malformations of heart and circulatory system: Secondary | ICD-10-CM | POA: Diagnosis not present

## 2019-11-03 DIAGNOSIS — M1990 Unspecified osteoarthritis, unspecified site: Secondary | ICD-10-CM | POA: Insufficient documentation

## 2019-11-03 DIAGNOSIS — Z95828 Presence of other vascular implants and grafts: Secondary | ICD-10-CM | POA: Insufficient documentation

## 2019-11-03 DIAGNOSIS — Z888 Allergy status to other drugs, medicaments and biological substances status: Secondary | ICD-10-CM | POA: Diagnosis not present

## 2019-11-03 DIAGNOSIS — F329 Major depressive disorder, single episode, unspecified: Secondary | ICD-10-CM | POA: Insufficient documentation

## 2019-11-03 DIAGNOSIS — Z7982 Long term (current) use of aspirin: Secondary | ICD-10-CM | POA: Diagnosis not present

## 2019-11-03 DIAGNOSIS — Z886 Allergy status to analgesic agent status: Secondary | ICD-10-CM | POA: Insufficient documentation

## 2019-11-03 DIAGNOSIS — I480 Paroxysmal atrial fibrillation: Secondary | ICD-10-CM | POA: Diagnosis not present

## 2019-11-03 DIAGNOSIS — F419 Anxiety disorder, unspecified: Secondary | ICD-10-CM | POA: Insufficient documentation

## 2019-11-03 DIAGNOSIS — I509 Heart failure, unspecified: Secondary | ICD-10-CM | POA: Diagnosis not present

## 2019-11-03 DIAGNOSIS — Z952 Presence of prosthetic heart valve: Secondary | ICD-10-CM | POA: Diagnosis not present

## 2019-11-03 DIAGNOSIS — Z79899 Other long term (current) drug therapy: Secondary | ICD-10-CM | POA: Diagnosis not present

## 2019-11-03 DIAGNOSIS — E1122 Type 2 diabetes mellitus with diabetic chronic kidney disease: Secondary | ICD-10-CM | POA: Insufficient documentation

## 2019-11-03 DIAGNOSIS — Z8673 Personal history of transient ischemic attack (TIA), and cerebral infarction without residual deficits: Secondary | ICD-10-CM | POA: Insufficient documentation

## 2019-11-03 DIAGNOSIS — N185 Chronic kidney disease, stage 5: Secondary | ICD-10-CM

## 2019-11-03 DIAGNOSIS — Z8249 Family history of ischemic heart disease and other diseases of the circulatory system: Secondary | ICD-10-CM | POA: Insufficient documentation

## 2019-11-03 HISTORY — PX: BASCILIC VEIN TRANSPOSITION: SHX5742

## 2019-11-03 HISTORY — DX: Unspecified cataract: H26.9

## 2019-11-03 HISTORY — DX: Personal history of other medical treatment: Z92.89

## 2019-11-03 LAB — POCT I-STAT, CHEM 8
BUN: 41 mg/dL — ABNORMAL HIGH (ref 8–23)
Calcium, Ion: 1.18 mmol/L (ref 1.15–1.40)
Chloride: 99 mmol/L (ref 98–111)
Creatinine, Ser: 4.7 mg/dL — ABNORMAL HIGH (ref 0.44–1.00)
Glucose, Bld: 128 mg/dL — ABNORMAL HIGH (ref 70–99)
HCT: 39 % (ref 36.0–46.0)
Hemoglobin: 13.3 g/dL (ref 12.0–15.0)
Potassium: 3.5 mmol/L (ref 3.5–5.1)
Sodium: 137 mmol/L (ref 135–145)
TCO2: 30 mmol/L (ref 22–32)

## 2019-11-03 LAB — GLUCOSE, CAPILLARY
Glucose-Capillary: 133 mg/dL — ABNORMAL HIGH (ref 70–99)
Glucose-Capillary: 120 mg/dL — ABNORMAL HIGH (ref 70–99)

## 2019-11-03 LAB — PROTIME-INR
INR: 1.1 (ref 0.8–1.2)
Prothrombin Time: 14.2 seconds (ref 11.4–15.2)

## 2019-11-03 SURGERY — TRANSPOSITION, VEIN, BASILIC
Anesthesia: General | Site: Arm Upper | Laterality: Right

## 2019-11-03 MED ORDER — LIDOCAINE 2% (20 MG/ML) 5 ML SYRINGE
INTRAMUSCULAR | Status: DC | PRN
  Administered 2019-11-03: 60 mg via INTRAVENOUS

## 2019-11-03 MED ORDER — FENTANYL CITRATE (PF) 100 MCG/2ML IJ SOLN
INTRAMUSCULAR | Status: DC | PRN
  Administered 2019-11-03 (×2): 50 ug via INTRAVENOUS

## 2019-11-03 MED ORDER — ONDANSETRON HCL 4 MG/2ML IJ SOLN
INTRAMUSCULAR | Status: DC | PRN
  Administered 2019-11-03: 4 mg via INTRAVENOUS

## 2019-11-03 MED ORDER — OXYCODONE-ACETAMINOPHEN 10-325 MG PO TABS: 1.0000 | ORAL_TABLET | Freq: Four times a day (QID) | ORAL | 0 refills | Status: AC | PRN

## 2019-11-03 MED ORDER — SODIUM CHLORIDE 0.9 % IV SOLN
INTRAVENOUS | Status: AC
  Filled 2019-11-03: qty 1.2

## 2019-11-03 MED ORDER — PHENYLEPHRINE 40 MCG/ML (10ML) SYRINGE FOR IV PUSH (FOR BLOOD PRESSURE SUPPORT)
PREFILLED_SYRINGE | INTRAVENOUS | Status: AC
  Filled 2019-11-03: qty 10

## 2019-11-03 MED ORDER — CHLORHEXIDINE GLUCONATE 4 % EX LIQD: 60.0000 mL | Freq: Once | CUTANEOUS | Status: DC

## 2019-11-03 MED ORDER — FENTANYL CITRATE (PF) 100 MCG/2ML IJ SOLN: 25.0000 ug | INTRAMUSCULAR | Status: DC | PRN

## 2019-11-03 MED ORDER — LIDOCAINE 2% (20 MG/ML) 5 ML SYRINGE
INTRAMUSCULAR | Status: AC
  Filled 2019-11-03: qty 5

## 2019-11-03 MED ORDER — SODIUM CHLORIDE 0.9 % IV SOLN: INTRAVENOUS | Status: DC

## 2019-11-03 MED ORDER — ONDANSETRON HCL 4 MG/2ML IJ SOLN
INTRAMUSCULAR | Status: AC
  Filled 2019-11-03: qty 2

## 2019-11-03 MED ORDER — FENTANYL CITRATE (PF) 100 MCG/2ML IJ SOLN
25.0000 ug | INTRAMUSCULAR | Status: DC | PRN
  Administered 2019-11-03: 13:00:00 25 ug via INTRAVENOUS

## 2019-11-03 MED ORDER — DEXAMETHASONE SODIUM PHOSPHATE 10 MG/ML IJ SOLN
INTRAMUSCULAR | Status: AC
  Filled 2019-11-03: qty 1

## 2019-11-03 MED ORDER — CEFAZOLIN SODIUM-DEXTROSE 2-4 GM/100ML-% IV SOLN
2.0000 g | INTRAVENOUS | Status: AC
  Administered 2019-11-03: 2 g via INTRAVENOUS

## 2019-11-03 MED ORDER — DEXAMETHASONE SODIUM PHOSPHATE 10 MG/ML IJ SOLN
INTRAMUSCULAR | Status: DC | PRN
  Administered 2019-11-03: 4 mg via INTRAVENOUS

## 2019-11-03 MED ORDER — 0.9 % SODIUM CHLORIDE (POUR BTL) OPTIME
TOPICAL | Status: DC | PRN
  Administered 2019-11-03: 1000 mL

## 2019-11-03 MED ORDER — SODIUM CHLORIDE 0.9 % IV SOLN: INTRAVENOUS | Status: DC | PRN

## 2019-11-03 MED ORDER — FENTANYL CITRATE (PF) 100 MCG/2ML IJ SOLN
INTRAMUSCULAR | Status: AC
  Filled 2019-11-03: qty 2

## 2019-11-03 MED ORDER — PROPOFOL 10 MG/ML IV BOLUS
INTRAVENOUS | Status: DC | PRN
  Administered 2019-11-03: 90 mg via INTRAVENOUS
  Administered 2019-11-03: 10 mg via INTRAVENOUS

## 2019-11-03 MED ORDER — MIDAZOLAM HCL 2 MG/2ML IJ SOLN
INTRAMUSCULAR | Status: AC
  Filled 2019-11-03: qty 2

## 2019-11-03 MED ORDER — PHENYLEPHRINE 40 MCG/ML (10ML) SYRINGE FOR IV PUSH (FOR BLOOD PRESSURE SUPPORT)
PREFILLED_SYRINGE | INTRAVENOUS | Status: DC | PRN
  Administered 2019-11-03: 40 ug via INTRAVENOUS
  Administered 2019-11-03 (×3): 80 ug via INTRAVENOUS

## 2019-11-03 MED ORDER — HEPARIN SODIUM (PORCINE) 1000 UNIT/ML IJ SOLN
INTRAMUSCULAR | Status: DC | PRN
  Administered 2019-11-03: 3000 [IU] via INTRAVENOUS

## 2019-11-03 MED ORDER — PHENYLEPHRINE HCL-NACL 10-0.9 MG/250ML-% IV SOLN
INTRAVENOUS | Status: DC | PRN
Start: 2019-11-03 — End: 2019-11-03
  Administered 2019-11-03: 75 ug/min via INTRAVENOUS

## 2019-11-03 MED ORDER — LIDOCAINE HCL 1 % IJ SOLN: INTRAMUSCULAR | Status: DC | PRN

## 2019-11-03 MED ORDER — MIDAZOLAM HCL 5 MG/5ML IJ SOLN
INTRAMUSCULAR | Status: DC | PRN
  Administered 2019-11-03: 2 mg via INTRAVENOUS

## 2019-11-03 MED ORDER — FENTANYL CITRATE (PF) 250 MCG/5ML IJ SOLN
INTRAMUSCULAR | Status: AC
  Filled 2019-11-03: qty 5

## 2019-11-03 MED ORDER — PROMETHAZINE HCL 25 MG/ML IJ SOLN: 6.2500 mg | INTRAMUSCULAR | Status: DC | PRN

## 2019-11-03 MED ORDER — LIDOCAINE HCL (PF) 1 % IJ SOLN
INTRAMUSCULAR | Status: AC
  Filled 2019-11-03: qty 30

## 2019-11-03 SURGICAL SUPPLY — 38 items
ARMBAND PINK RESTRICT EXTREMIT (MISCELLANEOUS) ×2 IMPLANT
CANISTER SUCT 3000ML PPV (MISCELLANEOUS) ×2 IMPLANT
CLIP VESOCCLUDE MED 24/CT (CLIP) ×2 IMPLANT
CLIP VESOCCLUDE MED 6/CT (CLIP) ×2 IMPLANT
CLIP VESOCCLUDE SM WIDE 24/CT (CLIP) ×2 IMPLANT
CLIP VESOCCLUDE SM WIDE 6/CT (CLIP) ×4 IMPLANT
COVER PROBE W GEL 5X96 (DRAPES) ×2 IMPLANT
COVER WAND RF STERILE (DRAPES) ×2 IMPLANT
DECANTER SPIKE VIAL GLASS SM (MISCELLANEOUS) ×2 IMPLANT
DERMABOND ADVANCED (GAUZE/BANDAGES/DRESSINGS) ×1
DERMABOND ADVANCED .7 DNX12 (GAUZE/BANDAGES/DRESSINGS) ×1 IMPLANT
ELECT REM PT RETURN 9FT ADLT (ELECTROSURGICAL) ×2
ELECTRODE REM PT RTRN 9FT ADLT (ELECTROSURGICAL) ×1 IMPLANT
GLOVE BIO SURGEON STRL SZ7.5 (GLOVE) ×2 IMPLANT
GLOVE BIOGEL PI IND STRL 8 (GLOVE) ×1 IMPLANT
GLOVE BIOGEL PI INDICATOR 8 (GLOVE) ×1
GOWN STRL REUS W/ TWL LRG LVL3 (GOWN DISPOSABLE) ×2 IMPLANT
GOWN STRL REUS W/ TWL XL LVL3 (GOWN DISPOSABLE) ×2 IMPLANT
GOWN STRL REUS W/TWL LRG LVL3 (GOWN DISPOSABLE) ×2
GOWN STRL REUS W/TWL XL LVL3 (GOWN DISPOSABLE) ×2
HEMOSTAT SPONGE AVITENE ULTRA (HEMOSTASIS) IMPLANT
KIT BASIN OR (CUSTOM PROCEDURE TRAY) ×2 IMPLANT
KIT TURNOVER KIT B (KITS) ×2 IMPLANT
NEEDLE HYPO 25GX1X1/2 BEV (NEEDLE) ×2 IMPLANT
NS IRRIG 1000ML POUR BTL (IV SOLUTION) ×2 IMPLANT
PACK CV ACCESS (CUSTOM PROCEDURE TRAY) ×2 IMPLANT
PAD ARMBOARD 7.5X6 YLW CONV (MISCELLANEOUS) ×4 IMPLANT
SUT MNCRL AB 4-0 PS2 18 (SUTURE) ×2 IMPLANT
SUT PROLENE 6 0 BV (SUTURE) ×4 IMPLANT
SUT PROLENE 7 0 BV 1 (SUTURE) IMPLANT
SUT SILK 2 0 SH (SUTURE) IMPLANT
SUT VIC AB 2-0 CT1 27 (SUTURE) ×1
SUT VIC AB 2-0 CT1 TAPERPNT 27 (SUTURE) ×1 IMPLANT
SUT VIC AB 3-0 SH 27 (SUTURE) ×2
SUT VIC AB 3-0 SH 27X BRD (SUTURE) ×2 IMPLANT
TOWEL GREEN STERILE (TOWEL DISPOSABLE) ×2 IMPLANT
UNDERPAD 30X30 (UNDERPADS AND DIAPERS) ×2 IMPLANT
WATER STERILE IRR 1000ML POUR (IV SOLUTION) ×2 IMPLANT

## 2019-11-03 NOTE — OR Nursing (Signed)
Patient left OR 11 with earrings in place.  Verified by D. Melina Copa, Immunologist.  August Albino, RN

## 2019-11-03 NOTE — Anesthesia Procedure Notes (Signed)
Procedure Name: LMA Insertion Date/Time: 11/03/2019 10:56 AM Performed by: Moshe Salisbury, CRNA Pre-anesthesia Checklist: Patient identified, Emergency Drugs available, Suction available and Patient being monitored Patient Re-evaluated:Patient Re-evaluated prior to induction Oxygen Delivery Method: Circle System Utilized Preoxygenation: Pre-oxygenation with 100% oxygen Induction Type: IV induction Ventilation: Mask ventilation without difficulty LMA: LMA inserted LMA Size: 5.0 Number of attempts: 1 Placement Confirmation: positive ETCO2 Tube secured with: Tape Dental Injury: Teeth and Oropharynx as per pre-operative assessment

## 2019-11-03 NOTE — Transfer of Care (Signed)
Immediate Anesthesia Transfer of Care Note  Patient: Jocelyn Hill  Procedure(s) Performed: RIGHT SECOND STAGE BASCILIC VEIN TRANSPOSITION (Right Arm Upper)  Patient Location: PACU  Anesthesia Type:General  Level of Consciousness: drowsy and patient cooperative  Airway & Oxygen Therapy: Patient Spontanous Breathing and Patient connected to nasal cannula oxygen  Post-op Assessment: Report given to RN, Post -op Vital signs reviewed and stable and Patient moving all extremities  Post vital signs: Reviewed and stable  Last Vitals:  Vitals Value Taken Time  BP 122/65 11/03/19 1247  Temp    Pulse 59 11/03/19 1248  Resp 11 11/03/19 1248  SpO2 100 % 11/03/19 1248  Vitals shown include unvalidated device data.  Last Pain:  Vitals:   11/03/19 0903  TempSrc:   PainSc: 8       Patients Stated Pain Goal: 2 (18/40/37 5436)  Complications: No apparent anesthesia complications

## 2019-11-03 NOTE — H&P (Signed)
History and Physical Interval Note:  11/03/2019 9:46 AM  Jocelyn Hill  has presented today for surgery, with the diagnosis of END STAGE RENAL DISEASE.  The various methods of treatment have been discussed with the patient and family. After consideration of risks, benefits and other options for treatment, the patient has consented to  Procedure(s): RIGHT SECOND STAGE [place] (Right) as a surgical intervention.  The patient's history has been reviewed, patient examined, no change in status, stable for surgery.  I have reviewed the patient's chart and labs.  Questions were answered to the patient's satisfaction.    Right second stage basilic vein fistula.  Marty Heck  Patient name: Jocelyn Hill MRN: [id number] DOB: June 26, 1951 Sex: female  REASON FOR VISIT: Follow-up after right 1st stage BVT  HPI:  Jocelyn Hill is a 69 y.o. female with multiple medical problems including end-stage renal disease who presents for follow-up after right first stage basilic vein transposition on 07/15/2019. We previously saw her shortly after surgery when she had some numbness in her right hand consistent with steal syndrome. Ultimately this was tolerable and she presents for fistula duplex today after the fistula has had time to mature. Continues to have numbness in the right fingers. No weakness. No tissue loss. She previously underwent ligation of the left upper arm AV graft in the setting of steal and ultimately it required excision after it got infected.      Past Medical History:  Diagnosis Date  . Anemia    low iron  . Anxiety   . Arthritis   . Bronchitis   . CHF (congestive heart failure) ([place])   . Coronary artery disease   . Depression   . Diabetes mellitus without complication ([place])   . Diet-controlled diabetes mellitus ([place])   . Dyspnea    SOME   . Dysrhythmia    PAF  . ESRD (end stage renal disease) ([place]) 03/16/2017   MWF- [facility]  . Fibromyalgia   . Ganglion  cyst    left wrist  . GERD (gastroesophageal reflux disease)   . Headache    chronic headaches- in the past (07/14/2019)  . Heart failure ([place])   . Heart murmur   . Hyperlipidemia   . Hypertension   . Hypothyroidism   . Mitral regurgitation   . Myocardial infarction ([place])    3x last one 2008  . PAF (paroxysmal atrial fibrillation) ([place])   . Pneumonia 2020   x 2  . S/P Maze operation for atrial fibrillation 02/18/2018   Complete bilateral atrial lesion set using bipolar radiofrequency and cryothermy ablation with clipping of LA appendage  . S/P mitral valve replacement with bioprosthetic valve 02/18/2018   [facility] Mitral stented bovine pericardial tissue valve Model 7300 TFX Serial # [id number] Size 29  . Stroke [facility])    no lasting residual - ? 2014  . Umbilical hernia         Past Surgical History:  Procedure Laterality Date  . ABDOMINAL HYSTERECTOMY     PARTIALS  . AV FISTULA PLACEMENT Left 04/23/2018   Procedure: LEFT ARTERIOVENOUS (AV) GRAFT CREATION; Surgeon: Marty Heck, MD; Location: [place]; Service: Vascular; Laterality: Left;  . AV FISTULA PLACEMENT Right 07/15/2019   Procedure: ARTERIOVENOUS (AV) FISTULA CREATION; Surgeon: Marty Heck, MD; Location: [place]; Service: Vascular; Laterality: Right;  . BIV PACEMAKER INSERTION CRT-P N/A 02/26/2018   Procedure: BIV PACEMAKER INSERTION CRT-P; Surgeon: Constance Haw, MD; Location: [place] [facility]; Service: Cardiovascular; Laterality: N/A;  . CARDIAC CATHETERIZATION    . CESAREAN SECTION    . COLONOSCOPY    . CORONARY ARTERY BYPASS GRAFT N/A 02/18/2018   Procedure: CORONARY ARTERY BYPASS GRAFTING (CABG) WITH IMA. ENDOSCOPIC VEIN HARVEST. IMA TO LAD, SVG TO CIRC.; Surgeon: Rexene Alberts, MD; Location: [place]; Service: Open Heart Surgery; Laterality: N/A;  . INSERTION OF DIALYSIS CATHETER N/A 02/18/2018   Procedure: PLACEMENT OF TEMPORARY HD CATHETER, POWER TRIALYSIS 13FR 20CM; Surgeon: Rexene Alberts,  MD; Location: [place]; Service: Vascular; Laterality: N/A;  . INSERTION OF DIALYSIS CATHETER Right 04/23/2018   Procedure: INSERTION OF DIALYSIS CATHETER; Surgeon: Marty Heck, MD; Location: [place]; Service: Vascular; Laterality: Right;  . INSERTION OF DIALYSIS CATHETER Right 03/01/2019   Procedure: INSERTION OF DIALYSIS CATHETER RIGHT Femoral; Surgeon: Elam Dutch, MD; Location: [place]; Service: Vascular; Laterality: Right;  . IR NEPHRO TUBE REMOV/FL  04/02/2017  . IR NEPHROSTOGRAM RIGHT THRU EXISTING ACCESS  04/02/2017  . IR NEPHROSTOMY PLACEMENT RIGHT  03/27/2017  . IR THORACENTESIS ASP PLEURAL SPACE W/IMG GUIDE  03/24/2017  . LIGATION ARTERIOVENOUS GORTEX GRAFT Left 03/01/2019   Procedure: LIGATION ARTERIOVENOUS GORTEX GRAFT Left arm; Surgeon: Elam Dutch, MD; Location: [place]; Service: Vascular; Laterality: Left;  Marland Kitchen MAZE N/A 02/18/2018   Procedure: MAZE; Surgeon: Rexene Alberts, MD; Location: [place]; Service: Open Heart Surgery; Laterality: N/A;  . MITRAL VALVE REPLACEMENT N/A 02/18/2018   Procedure: MITRAL VALVE (MV) REPLACEMENT; Surgeon: Rexene Alberts, MD; Location: [place]; Service: Open Heart Surgery; Laterality: N/A;  . MULTIPLE EXTRACTIONS WITH ALVEOLOPLASTY N/A 11/05/2017   Procedure: Extraction of tooth #'s 4,5,7,8,9,10,12,14 and 29 with alveoloplasty and gross debridement of remaining teeth; Surgeon: [name], DDS; Location: [place]; Service: Oral Surgery; Laterality: N/A;  . PATENT FORAMEN OVALE(PFO) CLOSURE N/A 02/18/2018   Procedure: PATENT FORAMEN OVALE (PFO) CLOSURE; Surgeon: Rexene Alberts, MD; Location: [place]; Service: Open Heart Surgery; Laterality: N/A;  . REMOVAL OF GRAFT Left 04/13/2019   Procedure: REMOVAL OF INFECTED GRAFT LEFT UPPER ARM WITH VEIN PATCH OF LEFT BRACHIAL ARTERY; Surgeon: Rosetta Posner, MD; Location: [place]; Service: Vascular; Laterality: Left;  . RIGHT/LEFT HEART CATH AND CORONARY ANGIOGRAPHY N/A 07/03/2017   Procedure: RIGHT/LEFT HEART  CATH AND CORONARY ANGIOGRAPHY; Surgeon: Larey Dresser, MD; Location: [place] [facility]; Service: Cardiovascular; Laterality: N/A;  . TEE WITHOUT CARDIOVERSION N/A 10/08/2017   Procedure: TRANSESOPHAGEAL ECHOCARDIOGRAM (TEE); Surgeon: Larey Dresser, MD; Location: [facility]; Service: Cardiovascular; Laterality: N/A;  . TEE WITHOUT CARDIOVERSION N/A 02/18/2018   Procedure: TRANSESOPHAGEAL ECHOCARDIOGRAM (TEE); Surgeon: Rexene Alberts, MD; Location: [place]; Service: Open Heart Surgery; Laterality: N/A;  . TONSILLECTOMY    . TUBAL LIGATION    . UPPER EXTREMITY VENOGRAPHY Right 06/09/2019   Procedure: UPPER EXTREMITY VENOGRAPHY; Surgeon: Marty Heck, MD; Location: [place] [facility]; Service: Cardiovascular; Laterality: Right;        Family History  Problem Relation Age of Onset  . Hypertension Mother    SOCIAL HISTORY:  Social History        Tobacco Use  . Smoking status: Former Smoker    Types: Cigarettes    Quit date: 1980    Years since quitting: 41.1  . Smokeless tobacco: Never Used  . Tobacco comment: short time  Substance Use Topics  . Alcohol use: No       Allergies  Allergen Reactions  . Ace Inhibitors Anaphylaxis and Swelling  . Motrin Ib [  Ibuprofen] Anaphylaxis         Current Outpatient Medications  Medication Sig Dispense Refill  . acetaminophen (TYLENOL) 500 MG tablet Take 1,500-2,000 mg by mouth 2 (two) times daily as needed for moderate pain or headache.     . albuterol (PROVENTIL HFA;VENTOLIN HFA) 108 (90 Base) MCG/ACT inhaler Inhale 2 puffs into the lungs every 6 (six) hours as needed for wheezing or shortness of breath.    . ALPRAZolam (XANAX) 0.25 MG tablet Take 1 tablet (0.25 mg total) by mouth at bedtime as needed for anxiety. 30 tablet 0  . amLODipine (NORVASC) 10 MG tablet Take 10 mg by mouth at bedtime.    Marland Kitchen aspirin EC 81 MG EC tablet Take 1 tablet (81 mg total) by mouth daily. (Patient taking differently: Take 81 mg by mouth at bedtime.  ) 30 tablet 0  . atorvastatin (LIPITOR) 80 MG tablet Take 1 tablet (80 mg total) by mouth daily. (Patient taking differently: Take 80 mg by mouth at bedtime. ) 30 tablet 3  . b complex vitamins tablet Take 1 tablet by mouth at bedtime.    . busPIRone (BUSPAR) 15 MG tablet Take 1 tablet (15 mg total) by mouth 2 (two) times daily. 60 tablet 0  . calcitRIOL (ROCALTROL) 0.5 MCG capsule Take 1 capsule (0.5 mcg total) by mouth every Monday, Wednesday, and Friday with hemodialysis.    Marland Kitchen carvedilol (COREG) 6.25 MG tablet Take 1 tablet (6.25 mg total) by mouth 2 (two) times daily. 60 tablet 0  . cholecalciferol (VITAMIN D) 1000 units tablet Take 1,000 Units by mouth at bedtime.     Marland Kitchen dextromethorphan-guaiFENesin (MUCINEX DM) 30-600 MG 12hr tablet Take 1 tablet by mouth 2 (two) times daily as needed for cough. 60 tablet 0  . diphenhydrAMINE (BENADRYL) 25 MG tablet Take 1 tablet (25 mg total) by mouth daily as needed for itching. 30 tablet 0  . doxazosin (CARDURA) 4 MG tablet Take 4-8 mg by mouth See admin instructions. Take one tablet (4 mg) by mouth every morning and two tablets (8 mg) at night    . gabapentin (NEURONTIN) 300 MG capsule Take 1 capsule (300 mg total) by mouth 3 (three) times a week. After dialysis on M/W/F 12 capsule 1  . heparin 1000 UNIT/ML injection 1 mL (1,000 Units total) by Intracatheter route every Monday, Wednesday, and Friday with hemodialysis. 1 mL   . hydrALAZINE (APRESOLINE) 100 MG tablet Take 1 tablet (100 mg total) by mouth 3 (three) times daily. 90 tablet 0  . isosorbide mononitrate (IMDUR) 120 MG 24 hr tablet Take 1 tablet (120 mg total) by mouth at bedtime. 30 tablet 0  . levothyroxine (SYNTHROID) 150 MCG tablet Take 150 mcg by mouth daily before breakfast.    . montelukast (SINGULAIR) 10 MG tablet Take 1 tablet (10 mg total) by mouth daily. (Patient taking differently: Take 10 mg by mouth at bedtime. ) 30 tablet 0  . multivitamin (RENA-VIT) TABS tablet Take 1 tablet by mouth  at bedtime.  0  . Naphazoline HCl (CLEAR EYES OP) Place 1 drop into both eyes daily as needed (dryness).    . nitroGLYCERIN (NITROSTAT) 0.4 MG SL tablet Place 0.4 mg under the tongue every 5 (five) minutes as needed for chest pain.    . Nutritional Supplements (FEEDING SUPPLEMENT, NEPRO CARB STEADY,) LIQD Take 237 mLs by mouth 3 (three) times daily between meals. (Patient taking differently: Take 237 mLs by mouth daily. )  0  . ondansetron (ZOFRAN) 4  MG tablet Take 4 mg by mouth every 6 (six) hours as needed for nausea or vomiting.    . pantoprazole (PROTONIX) 40 MG tablet Take 1 tablet (40 mg total) by mouth 2 (two) times daily before a meal. (Patient taking differently: Take 40 mg by mouth daily before breakfast. ) 60 tablet 0  . polyethylene glycol (MIRALAX / GLYCOLAX) packet Take 17 g by mouth daily. (Patient taking differently: Take 17 g by mouth daily as needed for mild constipation. ) 14 each 0  . tiZANidine (ZANAFLEX) 4 MG tablet Take 4 mg by mouth every 8 (eight) hours as needed for muscle spasms.    . traZODone (DESYREL) 100 MG tablet Take 100 mg by mouth at bedtime.    Marland Kitchen warfarin (COUMADIN) 3 MG tablet Take 1 tablet (3 mg total) by mouth daily at 6 PM. 30 tablet 0  . oxyCODONE (ROXICODONE) 5 MG immediate release tablet Take 1 tablet (5 mg total) by mouth every 6 (six) hours as needed. (Patient not taking: Reported on 08/30/2019) 6 tablet 0   No current facility-administered medications for this visit.   REVIEW OF SYSTEMS:  [X]  denotes positive finding, [ ]  denotes negative finding  Cardiac  Comments:  Chest pain or chest pressure:    Shortness of breath upon exertion:    Short of breath when lying flat:    Irregular heart rhythm:        Vascular    Pain in calf, thigh, or hip brought on by ambulation:    Pain in feet at night that wakes you up from your sleep:     Blood clot in your veins:    Leg swelling:         Pulmonary    Oxygen at home:    Productive cough:     Wheezing:          Neurologic    Sudden weakness in arms or legs:     Sudden numbness in arms or legs:     Sudden onset of difficulty speaking or slurred speech:    Temporary loss of vision in one eye:     Problems with dizziness:         Gastrointestinal    Blood in stool:     Vomited blood:         Genitourinary    Burning when urinating:     Blood in urine:        Psychiatric    Major depression:         Hematologic    Bleeding problems:    Problems with blood clotting too easily:        Skin    Rashes or ulcers:        Constitutional    Fever or chills:    PHYSICAL EXAM:     Vitals:   08/30/19 1452  BP: 135/66  Pulse: 69  Resp: 14  Temp: 97.7 F (36.5 C)  TempSrc: Temporal  SpO2: 98%  Weight: 120 lb (54.4 kg)  Height: 5\' 9"  (1.753 m)   GENERAL: The patient is a well-nourished female, in no acute distress. The vital signs are documented above.  CARDIAC: There is a regular rate and rhythm.  VASCULAR:  Right brachiobasilic fistula with good thrill  Right arm incision well-healed  Palpable right ulnar pulse  DATA:  None  Assessment/Plan:  69 year old female with end-stage renal disease that presents for follow-up and duplex after right first stage basilic vein transposition on 07/15/2019.  Fistula appears to have matured nicely based on duplex today. I have recommended proceeding with second stage transposition in the right arm. She does have some slight steal symptoms that is just numbness and we discussed the symptoms in detail and ultimately agreeable that this is tolerable at this time. No weakness no tissue loss etc. Has a tunneled right femoral catheter for immediate dialysis needs. Will arrange on a nondialysis day.  Marty Heck, MD  [facility]  Office: [phone]

## 2019-11-03 NOTE — Anesthesia Postprocedure Evaluation (Addendum)
Anesthesia Post Note  Patient: Jocelyn Hill  Procedure(s) Performed: RIGHT SECOND STAGE BASCILIC VEIN TRANSPOSITION (Right Arm Upper)     Patient location during evaluation: PACU Anesthesia Type: MAC Level of consciousness: sedated Pain management: pain level controlled Vital Signs Assessment: post-procedure vital signs reviewed and stable Respiratory status: spontaneous breathing and respiratory function stable Cardiovascular status: stable Postop Assessment: no apparent nausea or vomiting Anesthetic complications: no    Last Vitals:  Vitals:   11/03/19 1332 11/03/19 1346  BP: 119/67   Pulse: 60   Resp: 10   Temp:  36.7 C  SpO2: 98%     Last Pain:  Vitals:   11/03/19 1317  TempSrc:   PainSc: 8                  [name] DANIEL

## 2019-11-03 NOTE — Op Note (Signed)
    OPERATIVE NOTE   PROCEDURE: right second stage basilic vein transposition (brachiobasilic arteriovenous fistula) placement  PRE-OPERATIVE DIAGNOSIS: ESRD  POST-OPERATIVE DIAGNOSIS: same  SURGEON: Marty Heck, MD  ASSISTANT(S): Risa Grill, PA and OR staff  ANESTHESIA: LMA  ESTIMATED BLOOD LOSS: Minimal  FINDING(S): Three longitudinal skip incisions were made on the right upper arm and the basilic vein was circumferentially mobilized.  It was then tunneled more lateral and superficial in the upper arm and a new anastomosis was created near the antecubitum.  There was a good thrill at completion.  SPECIMEN(S):  None  INDICATIONS:   Jocelyn Hill is a 69 y.o. female who presents with ESRD and need for second stage basilic vein fistula.  The patient is scheduled for right second stage basilic vein transposition.  The patient is aware the risks include but are not limited to: bleeding, infection, steal syndrome, nerve damage, ischemic monomelic neuropathy, failure to mature, and need for additional procedures.  The patient is aware of the risks of the procedure and elects to proceed forward.   DESCRIPTION: After full informed written consent was obtained from the patient, the patient was brought back to the operating room and placed supine upon the operating table.  Prior to induction, the patient received IV antibiotics.   After obtaining adequate anesthesia, the patient was then prepped and draped in the standard fashion for a right arm access procedure.  I turned my attention first to identifying the patient's brachiobasilic arteriovenous fistula.  Using SonoSite guidance, the location of this fistula was marked out on the skin.    This was an excellent caliber vein.  I made three longitudinal incisions on the medial aspect of the right upper arm.  Through these incisions I dissected out circumferentially the basilic vein, taking care to protect the nerve.  Once the vein  was fully mobilized, all side branches were ligated between silk ties and divided.  The vein was marked for orientation.  I then used a curved tunneler to create a subcutaneous tunnel.  The patient was given 3000 units IV heparin.  The vein was then transected near the antecubital crease.  It was then brought to the previously created tunnel making sure to maintain proper orientation.  A primary anastomosis was then performed between the two cut ends of the vein with a running 6-0 Prolene.  Once this was done the clamps were released there was excellent flow through the fistula.  Hemostasis was then achieved.  The wound was irrigated.  The incision was closed with a deep layer of 3-0 Vicryl followed by a subcutaneous 4-0 Monocryl and Dermabond.  There were no immediate complications.  COMPLICATIONS: None  CONDITION: Stable  Marty Heck, MD [facility]: [phone]  11/03/2019, 12:24 PM

## 2019-11-03 NOTE — Discharge Instructions (Signed)
° °  [facility]  ° °Discharge Instructions ° °AV Fistula or Graft Surgery for Dialysis Access ° °Please refer to the following instructions for your post-procedure care. Your surgeon or physician assistant will discuss any changes with you. ° °Activity ° °You may drive the day following your surgery, if you are comfortable and no longer taking prescription pain medication. Resume full activity as the soreness in your incision resolves. ° °Bathing/Showering ° °You may shower after you go home. Keep your incision dry for 48 hours. Do not soak in a bathtub, hot tub, or swim until the incision heals completely. You may not shower if you have a hemodialysis catheter. ° °Incision Care ° °Clean your incision with mild soap and water after 48 hours. Pat the area dry with a clean towel. You do not need a bandage unless otherwise instructed. Do not apply any ointments or creams to your incision. You may have skin glue on your incision. Do not peel it off. It will come off on its own in about one week. Your arm may swell a bit after surgery. To reduce swelling use pillows to elevate your arm so it is above your heart. Your doctor will tell you if you need to lightly wrap your arm with an ACE bandage. ° °Diet ° °Resume your normal diet. There are not special food restrictions following this procedure. In order to heal from your surgery, it is CRITICAL to get adequate nutrition. Your body requires vitamins, minerals, and protein. Vegetables are the best source of vitamins and minerals. Vegetables also provide the perfect balance of protein. Processed food has little nutritional value, so try to avoid this. ° °Medications ° °Resume taking all of your medications. If your incision is causing pain, you may take over-the counter pain relievers such as acetaminophen (Tylenol). If you were prescribed a stronger pain medication, please be aware these medications can cause nausea and constipation. Prevent  nausea by taking the medication with a snack or meal. Avoid constipation by drinking plenty of fluids and eating foods with high amount of fiber, such as fruits, vegetables, and grains. Do not take Tylenol if you are taking prescription pain medications. ° ° ° ° °Follow up °Your surgeon may want to see you in the office following your access surgery. If so, this will be arranged at the time of your surgery. ° °Please call us immediately for any of the following conditions: ° °Increased pain, redness, drainage (pus) from your incision site °Fever of 101 degrees or higher °Severe or worsening pain at your incision site °Hand pain or numbness. ° °Reduce your risk of vascular disease: ° °Stop smoking. If you would like help, call [facility] at 1-800-QUIT-NOW (1-800-784-8669) or [facility] at 336-586-4000 ° °Manage your cholesterol °Maintain a desired weight °Control your diabetes °Keep your blood pressure down ° °Dialysis ° °It will take several weeks to several months for your new dialysis access to be ready for use. Your surgeon will determine when it is OK to use it. Your nephrologist will continue to direct your dialysis. You can continue to use your Permcath until your new access is ready for use. ° °If you have any questions, please call the office at [phone]. ° °

## 2019-11-03 NOTE — Progress Notes (Signed)
Patient stated she did not want to remove her earrings due to recently getting them pierced the week prior.  Patient made aware of the possibility of injury, or losing earrings in and after surgery.  Patient stated understanding that wearing her earrings during her peri-operative visit is at her own risk.    Earrings have (2) diamonds on each stud.    After further discussion, patient clearly stated she is aware of the risks of keeping them in and states she does not want to take them out.  Dr. Tobias Alexander aware.

## 2019-11-03 NOTE — OR Nursing (Signed)
Charted under T. Jacqualyn Posey, RN from [phone].  L.Wonda Olds, Therapist, sports

## 2019-11-03 NOTE — Telephone Encounter (Signed)
error 

## 2019-11-07 ENCOUNTER — Emergency Department (HOSPITAL_COMMUNITY)
Admission: EM | Admit: 2019-11-07 | Discharge: 2019-11-08 | Disposition: A | Discharge status: Home / Self Care | Payer: Medicare Other | Attending: Emergency Medicine | Admitting: Emergency Medicine

## 2019-11-07 ENCOUNTER — Encounter (HOSPITAL_COMMUNITY): Payer: Self-pay | Admitting: Emergency Medicine

## 2019-11-07 DIAGNOSIS — R42 Dizziness and giddiness: Secondary | ICD-10-CM

## 2019-11-07 DIAGNOSIS — I132 Hypertensive heart and chronic kidney disease with heart failure and with stage 5 chronic kidney disease, or end stage renal disease: Secondary | ICD-10-CM | POA: Diagnosis not present

## 2019-11-07 DIAGNOSIS — Z992 Dependence on renal dialysis: Secondary | ICD-10-CM | POA: Diagnosis not present

## 2019-11-07 DIAGNOSIS — E039 Hypothyroidism, unspecified: Secondary | ICD-10-CM | POA: Diagnosis not present

## 2019-11-07 DIAGNOSIS — Z79899 Other long term (current) drug therapy: Secondary | ICD-10-CM | POA: Insufficient documentation

## 2019-11-07 DIAGNOSIS — Z20822 Contact with and (suspected) exposure to covid-19: Secondary | ICD-10-CM | POA: Insufficient documentation

## 2019-11-07 DIAGNOSIS — Z87891 Personal history of nicotine dependence: Secondary | ICD-10-CM | POA: Insufficient documentation

## 2019-11-07 DIAGNOSIS — R251 Tremor, unspecified: Secondary | ICD-10-CM | POA: Diagnosis not present

## 2019-11-07 DIAGNOSIS — Z7984 Long term (current) use of oral hypoglycemic drugs: Secondary | ICD-10-CM | POA: Diagnosis not present

## 2019-11-07 DIAGNOSIS — I509 Heart failure, unspecified: Secondary | ICD-10-CM | POA: Insufficient documentation

## 2019-11-07 DIAGNOSIS — E1122 Type 2 diabetes mellitus with diabetic chronic kidney disease: Secondary | ICD-10-CM | POA: Insufficient documentation

## 2019-11-07 DIAGNOSIS — Z954 Presence of other heart-valve replacement: Secondary | ICD-10-CM | POA: Diagnosis not present

## 2019-11-07 DIAGNOSIS — N186 End stage renal disease: Secondary | ICD-10-CM | POA: Insufficient documentation

## 2019-11-07 LAB — COMPREHENSIVE METABOLIC PANEL
ALT: 16 U/L (ref 0–44)
AST: 38 U/L (ref 15–41)
Albumin: 4.1 g/dL (ref 3.5–5.0)
Alkaline Phosphatase: 109 U/L (ref 38–126)
Anion gap: 11 (ref 5–15)
BUN: 31 mg/dL — ABNORMAL HIGH (ref 8–23)
CO2: 29 mmol/L (ref 22–32)
Calcium: 8.9 mg/dL (ref 8.9–10.3)
Chloride: 97 mmol/L — ABNORMAL LOW (ref 98–111)
Creatinine, Ser: 3.31 mg/dL — ABNORMAL HIGH (ref 0.44–1.00)
GFR calc Af Amer: 16 mL/min — ABNORMAL LOW (ref >60)
GFR calc non Af Amer: 14 mL/min — ABNORMAL LOW (ref >60)
Glucose, Bld: 133 mg/dL — ABNORMAL HIGH (ref 70–99)
Potassium: 3.9 mmol/L (ref 3.5–5.1)
Sodium: 137 mmol/L (ref 135–145)
Total Bilirubin: 1.1 mg/dL (ref 0.3–1.2)
Total Protein: 7 g/dL (ref 6.5–8.1)

## 2019-11-07 LAB — CBC
HCT: 35 % — ABNORMAL LOW (ref 36.0–46.0)
Hemoglobin: 11.7 g/dL — ABNORMAL LOW (ref 12.0–15.0)
MCH: 28.5 pg (ref 26.0–34.0)
MCHC: 33.4 g/dL (ref 30.0–36.0)
MCV: 85.4 fL (ref 80.0–100.0)
Platelets: 129 10*3/uL — ABNORMAL LOW (ref 150–400)
RBC: 4.1 MIL/uL (ref 3.87–5.11)
RDW: 12.8 % (ref 11.5–15.5)
WBC: 7.7 10*3/uL (ref 4.0–10.5)
nRBC: 0 % (ref 0.0–0.2)

## 2019-11-07 MED ORDER — SODIUM CHLORIDE 0.9% FLUSH: 3.0000 mL | Freq: Once | INTRAVENOUS | Status: DC

## 2019-11-07 NOTE — ED Triage Notes (Signed)
Pt arrives via ems after pt left dialysis in car with daughter she began to have tremors in her left arm and daughter states she was acting "different" Pt is alert and ox4 on arrival to ER- pt does have tremor in left arm.

## 2019-11-08 ENCOUNTER — Emergency Department (HOSPITAL_COMMUNITY): Payer: Medicare Other

## 2019-11-08 LAB — SARS CORONAVIRUS 2 (TAT 6-24 HRS): SARS Coronavirus 2: NEGATIVE

## 2019-11-08 LAB — PROTIME-INR
INR: 1.3 — ABNORMAL HIGH (ref 0.8–1.2)
Prothrombin Time: 15.9 seconds — ABNORMAL HIGH (ref 11.4–15.2)

## 2019-11-08 MED ORDER — MECLIZINE HCL 25 MG PO TABS
25.0000 mg | ORAL_TABLET | Freq: Once | ORAL | Status: AC
  Administered 2019-11-08: 25 mg via ORAL
  Filled 2019-11-08: qty 1

## 2019-11-08 MED ORDER — MECLIZINE HCL 25 MG PO TABS: 12.5000 mg | ORAL_TABLET | Freq: Three times a day (TID) | ORAL | 0 refills | Status: AC | PRN

## 2019-11-08 MED ORDER — LORAZEPAM 2 MG/ML IJ SOLN
1.0000 mg | Freq: Once | INTRAMUSCULAR | Status: AC | PRN
  Administered 2019-11-08: 1 mg via INTRAVENOUS
  Filled 2019-11-08: qty 1

## 2019-11-08 NOTE — ED Notes (Signed)
Patient transported to CT 

## 2019-11-08 NOTE — Progress Notes (Signed)
Informed of MRI for today.   Device system confirmed to be MRI conditional, with implant date > 6 weeks ago and no evidence of abandoned or epicardial leads in review of most recent CXR Interrogation from today reviewed, pt is currently AS-VP at 66 bpm Change device settings for MRI to DOO at 90 bpm  Program device back to pre-MRI settings after completion of exam.  Shirley Friar, PA-C  11/08/2019 12:30 PM

## 2019-11-08 NOTE — ED Notes (Signed)
Have patient call daughter Veronda Prude

## 2019-11-08 NOTE — ED Notes (Signed)
Patient verbalizes understanding of discharge instructions. Opportunity for questioning and answers were provided. Armband removed by staff, pt discharged from ED. Wheeled out to lobby  

## 2019-11-08 NOTE — Discharge Instructions (Signed)
If you develop weakness or numbness in your arms or legs, vision changes, new or worsening headache, inability to walk, or any other new/concerning symptoms then return to the ER for evaluation.

## 2019-11-08 NOTE — ED Provider Notes (Signed)
[name] Provider Note   CSN: [id number] Arrival date & time: 11/07/19  1800     History Chief Complaint  Patient presents with  . Tremors    Jocelyn Hill is a 69 y.o. female.  The history is provided by the patient.  Dizziness Quality:  Room spinning Severity:  Moderate Onset quality:  Gradual Duration:  4 days Timing:  Intermittent Progression:  Worsening Chronicity:  New Context: standing up   Relieved by:  Being still Worsened by:  Standing up Associated symptoms: headaches   Associated symptoms: no chest pain, no vision changes, no vomiting and no weakness   Risk factors: heart disease and hx of stroke    Patient with extensive history including CAD, CHF, diabetes, previous stroke, end-stage renal disease on dialysis presents with tremors and dizziness.   Patient recently underwent a second stage basilic vein transposition for AV fistula in her right arm on May 6.  No immediate complications from the surgery She reports the following day and since she is having difficulty walking.  She reports when standing up there is some dizziness and she has fallen.  No syncope.  No traumatic injury. Patient has a catheter in her right femoral region that is used for dialysis.  She underwent dialysis earlier in the day, and is reportedly been having tremors.  Family also reported she was "different "patient denies this. Past Medical History:  Diagnosis Date  . Anemia    low iron  . Anxiety   . Arthritis   . Bronchitis   . Cataract    bilateral - MD justing watching  . CHF (congestive heart failure) ([place])   . Coronary artery disease   . Depression   . Diabetes mellitus without complication ([place])   . Diet-controlled diabetes mellitus ([place])   . Dyspnea    with exertion  . Dysrhythmia    PAF  . ESRD (end stage renal disease) ([place]) 03/16/2017   MWF- [facility]  . Fibromyalgia   . Ganglion cyst    left wrist  . GERD  (gastroesophageal reflux disease)   . Headache    chronic headaches- in the past (07/14/2019)  . Heart failure ([place])   . Heart murmur   . History of blood transfusion   . Hyperlipidemia   . Hypertension   . Hypothyroidism   . Mitral regurgitation   . Myocardial infarction ([place])    3x last one 2008  . PAF (paroxysmal atrial fibrillation) ([place])   . Pneumonia 2020   x 2  . Presence of permanent cardiac pacemaker 2019   St Jude BIV Pacer  . S/P Maze operation for atrial fibrillation 02/18/2018   Complete bilateral atrial lesion set using bipolar radiofrequency and cryothermy ablation with clipping of LA appendage  . S/P mitral valve replacement with bioprosthetic valve 02/18/2018   [facility] Mitral stented bovine pericardial tissue valve Model 7300 TFX Serial # [id number] Size 29  . Stroke [facility])    no lasting residual - ? 2014  . Umbilical hernia     Patient Active Problem List   Diagnosis Date Noted  . Acute GI bleeding 09/04/2019  . Steal syndrome dialysis vascular access ([place]) 08/02/2019  . Malnutrition of moderate degree 04/15/2019  . Stroke ([place])   . Type II diabetes mellitus with renal manifestations ([place])   . CAD (coronary artery disease)   . GERD (gastroesophageal reflux disease)   . AV fistula infection ([place])   . Cough   .  Other mechanical complication of other vascular grafts, subsequent encounter 03/22/2019  . Chills (without fever) 09/24/2018  . Other disorders of phosphorus metabolism 08/12/2018  . Encounter for immunization 07/28/2018  . Diarrhea, unspecified 07/19/2018  . Hypercalcemia 05/26/2018  . Coagulation defect, unspecified ([place]) 05/03/2018  . Dyspnea, unspecified 05/03/2018  . Hypokalemia 05/03/2018  . Iron deficiency anemia, unspecified 05/03/2018  . Pain, unspecified 05/03/2018  . Pruritus, unspecified 05/03/2018  . Anemia in chronic kidney disease 04/30/2018  . Atherosclerotic heart disease of native coronary artery without angina pectoris  04/30/2018  . Nonrheumatic mitral valve disorder, unspecified 04/30/2018  . Anxiety disorder due to known physiological condition 04/30/2018  . ESRD on dialysis ([place]) 04/30/2018  . Ganglion, left wrist 04/30/2018  . Gastro-esophageal reflux disease without esophagitis 04/30/2018  . Headache 04/30/2018  . Personal history of transient ischemic attack (TIA), and cerebral infarction without residual deficits 04/30/2018  . Secondary hyperparathyroidism of renal origin ([place]) 04/30/2018  . Umbilical hernia without obstruction or gangrene 04/30/2018  . Left arm pain   . Vascular dialysis catheter in place [facility])   . CKD (chronic kidney disease) stage 5, GFR less than 15 ml/min (HCC)   . CHF exacerbation ([place]) 04/17/2018  . CKD (chronic kidney disease) stage V requiring chronic dialysis ([place])   . Pleural effusion   . S/P biventricular cardiac pacemaker procedure 03/05/2018  . S/P CABG x 2 02/18/2018  . S/P mitral valve replacement with bioprosthetic valve + CABG x2 + maze procedure 02/18/2018  . H/O mitral valve replacement 02/18/2018  . S/P Maze operation for atrial fibrillation 02/18/2018  . Unspecified protein-calorie malnutrition ([place]) 02/11/2018  . Chest pain due to CAD ([place]) 02/09/2018  . Other specified arthritis, unspecified site 12/21/2017  . Non-ST elevation (NSTEMI) myocardial infarction ([place]) 11/05/2017  . Right lower lobe pneumonia 09/20/2017  . ATN (acute tubular necrosis) ([place]) 09/19/2017  . Periodontal disease 09/19/2017  . Anxiety 09/18/2017  . Type 2 diabetes mellitus with hyperglycemia, without long-term current use of insulin ([place]) 09/16/2017  . ESRD on hemodialysis ([place]) 09/16/2017  . Elevated troponin   . Controlled type 2 diabetes mellitus with diabetic nephropathy ([place])   . Protein-calorie malnutrition, severe 04/21/2017  . Substance abuse ([place])   . Chronic combined systolic and diastolic CHF (congestive heart failure) ([place])   . Pulmonary hypertension ([place])   .  Severe mitral regurgitation   . Stage 4 chronic kidney disease ([place])   . Anemia due to GI blood loss 03/13/2017  . Hyperlipidemia, unspecified 03/12/2017  . Hypertension 03/12/2017  . Hypothyroidism 03/12/2017  . Depression 03/12/2017  . PAF (paroxysmal atrial fibrillation) ([place]) 03/12/2017  . Dysphagia 03/12/2017    Past Surgical History:  Procedure Laterality Date  . ABDOMINAL HYSTERECTOMY     PARTIALS  . AV FISTULA PLACEMENT Left 04/23/2018   Procedure: LEFT ARTERIOVENOUS (AV) GRAFT CREATION;  Surgeon: Marty Heck, MD;  Location: [place];  Service: Vascular;  Laterality: Left;  . AV FISTULA PLACEMENT Right 07/15/2019   Procedure: ARTERIOVENOUS (AV) FISTULA CREATION;  Surgeon: Marty Heck, MD;  Location: [place];  Service: Vascular;  Laterality: Right;  . BASCILIC VEIN TRANSPOSITION Right 11/03/2019   Procedure: RIGHT SECOND STAGE BASCILIC VEIN TRANSPOSITION;  Surgeon: Marty Heck, MD;  Location: [place];  Service: Vascular;  Laterality: Right;  . BIOPSY  09/06/2019   Procedure: BIOPSY;  Surgeon: [name], MD;  Location: [facility];  Service: Gastroenterology;;  . BIV PACEMAKER INSERTION CRT-P N/A 02/26/2018   Procedure: BIV PACEMAKER  INSERTION CRT-P;  Surgeon: Constance Haw, MD;  Location: [place] CV LAB;  Service: Cardiovascular;  Laterality: N/A;  . CARDIAC CATHETERIZATION    . CESAREAN SECTION    . COLONOSCOPY    . COLONOSCOPY WITH PROPOFOL N/A 09/06/2019   Procedure: COLONOSCOPY WITH PROPOFOL;  Surgeon: [name], MD;  Location: [facility];  Service: Gastroenterology;  Laterality: N/A;  . CORONARY ARTERY BYPASS GRAFT N/A 02/18/2018   Procedure: CORONARY ARTERY BYPASS GRAFTING (CABG) WITH IMA. ENDOSCOPIC VEIN HARVEST. IMA TO LAD, SVG TO CIRC.;  Surgeon: Rexene Alberts, MD;  Location: [place];  Service: Open Heart Surgery;  Laterality: N/A;  . ESOPHAGOGASTRODUODENOSCOPY (EGD) WITH PROPOFOL N/A 09/06/2019   Procedure: ESOPHAGOGASTRODUODENOSCOPY (EGD)  WITH PROPOFOL;  Surgeon: [name], MD;  Location: [place];  Service: Gastroenterology;  Laterality: N/A;  . HEMOSTASIS CLIP PLACEMENT  09/06/2019   Procedure: HEMOSTASIS CLIP PLACEMENT;  Surgeon: [name], MD;  Location: [place];  Service: Gastroenterology;;  . HOT HEMOSTASIS N/A 09/06/2019   Procedure: HOT HEMOSTASIS (ARGON PLASMA COAGULATION/BICAP);  Surgeon: [name], MD;  Location: [place];  Service: Gastroenterology;  Laterality: N/A;  . INSERTION OF DIALYSIS CATHETER N/A 02/18/2018   Procedure: PLACEMENT OF TEMPORARY HD CATHETER, POWER TRIALYSIS 13FR 20CM;  Surgeon: Rexene Alberts, MD;  Location: [place];  Service: Vascular;  Laterality: N/A;  . INSERTION OF DIALYSIS CATHETER Right 04/23/2018   Procedure: INSERTION OF DIALYSIS CATHETER;  Surgeon: Marty Heck, MD;  Location: [place];  Service: Vascular;  Laterality: Right;  . INSERTION OF DIALYSIS CATHETER Right 03/01/2019   Procedure: INSERTION OF DIALYSIS CATHETER RIGHT Femoral;  Surgeon: Elam Dutch, MD;  Location: [place];  Service: Vascular;  Laterality: Right;  . IR NEPHRO TUBE REMOV/FL  04/02/2017  . IR NEPHROSTOGRAM RIGHT THRU EXISTING ACCESS  04/02/2017  . IR NEPHROSTOMY PLACEMENT RIGHT  03/27/2017  . IR THORACENTESIS ASP PLEURAL SPACE W/IMG GUIDE  03/24/2017  . LIGATION ARTERIOVENOUS GORTEX GRAFT Left 03/01/2019   Procedure: LIGATION ARTERIOVENOUS GORTEX GRAFT Left arm;  Surgeon: Elam Dutch, MD;  Location: [place];  Service: Vascular;  Laterality: Left;  Marland Kitchen MAZE N/A 02/18/2018   Procedure: MAZE;  Surgeon: Rexene Alberts, MD;  Location: [place];  Service: Open Heart Surgery;  Laterality: N/A;  . MITRAL VALVE REPLACEMENT N/A 02/18/2018   Procedure: MITRAL VALVE (MV) REPLACEMENT;  Surgeon: Rexene Alberts, MD;  Location: [place];  Service: Open Heart Surgery;  Laterality: N/A;  . MULTIPLE EXTRACTIONS WITH ALVEOLOPLASTY N/A 11/05/2017   Procedure: Extraction of tooth #'s 4,5,7,8,9,10,12,14 and 29 with  alveoloplasty and gross debridement of remaining teeth;  Surgeon: [name], DDS;  Location: [facility];  Service: Oral Surgery;  Laterality: N/A;  . PATENT FORAMEN OVALE(PFO) CLOSURE N/A 02/18/2018   Procedure: PATENT FORAMEN OVALE (PFO) CLOSURE;  Surgeon: Rexene Alberts, MD;  Location: [place];  Service: Open Heart Surgery;  Laterality: N/A;  . POLYPECTOMY  09/06/2019   Procedure: POLYPECTOMY;  Surgeon: [name], MD;  Location: [place];  Service: Gastroenterology;;  . REMOVAL OF GRAFT Left 04/13/2019   Procedure: REMOVAL OF INFECTED GRAFT LEFT UPPER ARM WITH VEIN PATCH OF LEFT BRACHIAL ARTERY;  Surgeon: Rosetta Posner, MD;  Location: [place];  Service: Vascular;  Laterality: Left;  . RIGHT/LEFT HEART CATH AND CORONARY ANGIOGRAPHY N/A 07/03/2017   Procedure: RIGHT/LEFT HEART CATH AND CORONARY ANGIOGRAPHY;  Surgeon: Larey Dresser, MD;  Location: [place] CV LAB;  Service: Cardiovascular;  Laterality: N/A;  .  TEE WITHOUT CARDIOVERSION N/A 10/08/2017   Procedure: TRANSESOPHAGEAL ECHOCARDIOGRAM (TEE);  Surgeon: Larey Dresser, MD;  Location: [facility] ENDOSCOPY;  Service: Cardiovascular;  Laterality: N/A;  . TEE WITHOUT CARDIOVERSION N/A 02/18/2018   Procedure: TRANSESOPHAGEAL ECHOCARDIOGRAM (TEE);  Surgeon: Rexene Alberts, MD;  Location: [place];  Service: Open Heart Surgery;  Laterality: N/A;  . TONSILLECTOMY    . TUBAL LIGATION    . UPPER EXTREMITY VENOGRAPHY Right 06/09/2019   Procedure: UPPER EXTREMITY VENOGRAPHY;  Surgeon: Marty Heck, MD;  Location: [place] CV LAB;  Service: Cardiovascular;  Laterality: Right;     OB History   No obstetric history on file.     Family History  Problem Relation Age of Onset  . Hypertension Mother     Social History   Tobacco Use  . Smoking status: Former Smoker    Types: Cigarettes    Quit date: 1980    Years since quitting: 41.3  . Smokeless tobacco: Never Used  . Tobacco comment: short time  Substance Use Topics  . Alcohol  use: No  . Drug use: Not Currently    Comment: marijuana in the past    Home Medications Prior to Admission medications   Medication Sig Start Date End Date Taking? Authorizing Provider  acetaminophen (TYLENOL) 500 MG tablet Take 1,500-2,000 mg by mouth 2 (two) times daily as needed for moderate pain or headache.     [provider]  albuterol (PROVENTIL HFA;VENTOLIN HFA) 108 (90 Base) MCG/ACT inhaler Inhale 2 puffs into the lungs every 6 (six) hours as needed for wheezing or shortness of breath.    [provider]  ALPRAZolam Duanne Moron) 0.25 MG tablet Take 1 tablet (0.25 mg total) by mouth at bedtime as needed for anxiety. 04/16/18   Georgiana Shore, NP  amLODipine (NORVASC) 10 MG tablet Take 10 mg by mouth at bedtime.    [provider]  atorvastatin (LIPITOR) 80 MG tablet Take 1 tablet (80 mg total) by mouth daily. Patient taking differently: Take 80 mg by mouth at bedtime.  03/06/18   Barrett, Erin R, PA-C  AURYXIA 1 GM 210 MG(Fe) tablet Take 210 mg by mouth daily.  08/23/19   [provider]  b complex vitamins tablet Take 1 tablet by mouth at bedtime.    [provider]  busPIRone (BUSPAR) 15 MG tablet Take 1 tablet (15 mg total) by mouth 2 (two) times daily. 04/23/17   Allie Bossier, MD  calcitRIOL (ROCALTROL) 0.5 MCG capsule Take 1 capsule (0.5 mcg total) by mouth every Monday, Wednesday, and Friday with hemodialysis. 04/20/19   Ezekiel Slocumb, DO  carvedilol (COREG) 25 MG tablet Take 1 tablet (25 mg total) by mouth 2 (two) times daily. 10/17/19 01/15/20  Camnitz, Ocie Doyne, MD  cholecalciferol (VITAMIN D) 1000 units tablet Take 1,000 Units by mouth at bedtime.     [provider]  dextromethorphan-guaiFENesin (MUCINEX DM) 30-600 MG 12hr tablet Take 1 tablet by mouth 2 (two) times daily as needed for cough. 04/20/19   Ezekiel Slocumb, DO  diphenhydrAMINE (BENADRYL) 25 MG tablet Take 1 tablet (25 mg total) by mouth daily as needed for  itching. 04/20/19   Ezekiel Slocumb, DO  doxazosin (CARDURA) 4 MG tablet Take 4-8 mg by mouth See admin instructions. Take one tablet (4 mg) by mouth every morning and two tablets (8 mg) at night    [provider]  gabapentin (NEURONTIN) 300 MG capsule Take 1 capsule (300 mg  total) by mouth 3 (three) times a week. After dialysis on M/W/F 04/20/19   Nicole Kindred A, DO  gabapentin (NEURONTIN) 800 MG tablet Take 800 mg by mouth in the morning, at noon, in the evening, and at bedtime.    [provider]  heparin 1000 UNIT/ML injection 1 mL (1,000 Units total) by Intracatheter route every Monday, Wednesday, and Friday with hemodialysis. 04/20/19   Nicole Kindred A, DO  hydrALAZINE (APRESOLINE) 100 MG tablet Take 1 tablet (100 mg total) by mouth 3 (three) times daily. 04/20/19   Ezekiel Slocumb, DO  isosorbide mononitrate (IMDUR) 120 MG 24 hr tablet Take 1 tablet (120 mg total) by mouth at bedtime. 04/20/19   Ezekiel Slocumb, DO  levothyroxine (SYNTHROID) 100 MCG tablet Take 100 mcg by mouth daily before breakfast.    [provider]  mirtazapine (REMERON) 15 MG tablet Take 15 mg by mouth at bedtime. 08/09/19   [provider]  montelukast (SINGULAIR) 10 MG tablet Take 1 tablet (10 mg total) by mouth daily. 04/24/17   Allie Bossier, MD  multivitamin (RENA-VIT) TABS tablet Take 1 tablet by mouth at bedtime. 05/01/18   Glenis Smoker, MD  nitroGLYCERIN (NITROSTAT) 0.4 MG SL tablet Place 0.4 mg under the tongue every 5 (five) minutes as needed for chest pain.    [provider]  Nutritional Supplements (FEEDING SUPPLEMENT, NEPRO CARB STEADY,) LIQD Take 237 mLs by mouth 3 (three) times daily between meals. 05/01/18   Glenis Smoker, MD  oxyCODONE-acetaminophen (PERCOCET) 10-325 MG tablet Take 1 tablet by mouth every 6 (six) hours as needed for pain. 11/03/19 11/02/20  Setzer, Edman Circle, PA-C  pantoprazole (PROTONIX) 40 MG tablet Take 1 tablet (40 mg  total) by mouth 2 (two) times daily before a meal. 04/23/17   Allie Bossier, MD  polyethylene glycol [facility] Floria Raveling) packet Take 17 g by mouth daily. 05/02/18   Glenis Smoker, MD  traZODone (DESYREL) 100 MG tablet Take 100 mg by mouth at bedtime.    [provider]  warfarin (COUMADIN) 3 MG tablet Take 1 tablet (3 mg total) by mouth daily at 6 PM. 04/20/19   Nicole Kindred A, DO    Allergies    Ace inhibitors and Motrin ib [ibuprofen]  Review of Systems   Review of Systems  Constitutional: Negative for fever.  Eyes: Negative for visual disturbance.  Cardiovascular: Negative for chest pain.  Gastrointestinal: Negative for vomiting.  Neurological: Positive for dizziness, tremors and headaches. Negative for syncope, speech difficulty and weakness.  All other systems reviewed and are negative.   Physical Exam Updated Vital Signs BP (!) 155/71 (BP Location: Left Arm)   Pulse 66   Temp 98.4 F (36.9 C) (Oral)   Resp 18   LMP  (LMP Unknown)   SpO2 98%   Physical Exam CONSTITUTIONAL: Well developed, elderly, no acute distress HEAD: Normocephalic/atraumatic EYES: EOMI/PERRL, no nystagmus, no visual field deficit  no ptosis ENMT: Mucous membranes moist, poor dentition.  No obvious dental abscess ?  Right submandibular gland enlargement, no tenderness or erythema.  No stridor or drooling NECK: supple no meningeal signs CV: S1/S2 noted LUNGS: Lungs are clear to auscultation bilaterally, no apparent distress ABDOMEN: soft, nontender, no rebound or guarding GU:no cva tenderness NEURO:Awake/alert, face symmetric, no arm or leg drift is noted Equal 5/5 strength with  elbow flex/extension, wrist flex/extension in upper extremities and equal hand grips bilaterally Equal 5/5 strength with hip flexion,knee flex/extension, foot dorsi/plantar  flexion No obvious cranial nerve deficit Patient with mild ataxia upon standing and walking No past pointing Sensation to light  touch intact in all extremities-but reports chronic numbness in fingers of both hands No tremors noted EXTREMITIES: pulses normal, full ROM, wound noted to right upper arm with localized bruising but no significant erythema or drainage Hands are warm to touch, no discoloration either hand No deformities noted to the extremities HD catheter to right femoral region SKIN: warm, color normal, pacemaker noted to left chest.   PSYCH: no abnormalities of mood noted  ED Results / Procedures / Treatments   Labs (all labs ordered are listed, but only abnormal results are displayed) Labs Reviewed  COMPREHENSIVE METABOLIC PANEL - Abnormal; Notable for the following components:      Result Value   Chloride 97 (*)    Glucose, Bld 133 (*)    BUN 31 (*)    Creatinine, Ser 3.31 (*)    GFR calc non Af Amer 14 (*)    GFR calc Af Amer 16 (*)    All other components within normal limits  CBC - Abnormal; Notable for the following components:   Hemoglobin 11.7 (*)    HCT 35.0 (*)    Platelets 129 (*)    All other components within normal limits  SARS CORONAVIRUS 2 (TAT 6-24 HRS)  PROTIME-INR    EKG EKG Interpretation  Date/Time:  Monday Nov 07 2019 18:05:21 EDT Ventricular Rate:  64 PR Interval:  174 QRS Duration: 136 QT Interval:  506 QTC Calculation: 522 R Axis:   -61 Text Interpretation: AV dual-paced rhythm with occasional Premature ventricular complexes Biventricular pacemaker detected Abnormal ECG No significant change since last tracing Confirmed by Ripley Fraise [phone]) on 11/08/2019 5:38:26 AM   Radiology No results found.  Procedures Procedures  Medications Ordered in ED Medications  sodium chloride flush (NS) 0.9 % injection 3 mL (has no administration in time range)    ED Course  I have reviewed the triage vital signs and the nursing notes.  Pertinent labs & imaging results that were available during my care of the patient were reviewed by me and considered in my  medical decision making (see chart for details).    MDM Rules/Calculators/A&P                      6:17 AM Patient presents for tremor and dizziness since recent AV fistula surgery.  Patient reports multiple falls but no obvious traumatic injuries When I attempted to ambulate patient she does appear unsteady and had to be helped back to the bed Occult stroke is a possibility.  However due to previous pacemaker placement, would not be able to obtain emergent MRI.  We will start with stat CT head and consult neurology  tPA in stroke considered but not given due to: Onset over 3-4.5hours 6:36 AM D/w dr Aroor with neurology He recommends starting with CT head.  However patient could also have orthostatic hypotension or metabolic derangement.  He requests to reconsult neurology once CT head has been completed. 7:05 AM Signed out to dr Regenia Skeeter with imaging/labs pending.  She may benefit from neuro consultation.     This patient presents to the ED for concern of tremors and dizziness, this involves an extensive number of treatment options, and is a complaint that carries with it a high risk of complications and morbidity.  The differential diagnosis includes stroke, ICH, electrolyte abnormality   Lab Tests:   I  Ordered, reviewed, and interpreted labs, which included electrolytes, complete blood count    Imaging Studies ordered:   I ordered imaging studies which included CT head    Additional history obtained:    Previous records obtained and reviewed   Consultations Obtained:   I consulted neurology and discussed lab  findings    Final Clinical Impression(s) / ED Diagnoses Final diagnoses:  None    Rx / DC Orders ED Discharge Orders    None       Ripley Fraise, MD 11/08/19 [phone]

## 2019-11-08 NOTE — ED Provider Notes (Signed)
8:33 AM Discussed with Dr. Leonel Ramsay.  Her pacemaker should be MRI capable given the year was placed and the model.  Recommends MRI brain and C-spine.  Will also try some Antivert and give as needed Ativan as she is claustrophobic.  Otherwise, dispo per her MRI results.  MRIs show chronic findings.  No acute stroke.  She is able to ambulate though feels a little off balance.  She feels like she can go home as long she can hold onto something.  We will also give some Antivert.  Will refer to neurology given what sounds like vertigo but also she is having new tremors over the last couple months.   Sherwood Gambler, MD 11/08/19 1430

## 2019-11-08 NOTE — ED Notes (Signed)
Informed by lobby staff that patient is recording other patients and staff members in lobby. Video footage deleted. Security aware.

## 2019-11-08 NOTE — ED Notes (Signed)
Pt taken to MRI  

## 2019-11-08 NOTE — Progress Notes (Signed)
Changed device settings for MRI to DOO at 90 bpm per order.  Will program device back to pre-MRI settings after completion of exam

## 2019-11-09 ENCOUNTER — Ambulatory Visit: Payer: Medicare Other | Admitting: Neurology

## 2019-11-21 ENCOUNTER — Telehealth (HOSPITAL_COMMUNITY): Payer: Self-pay

## 2019-11-21 NOTE — Telephone Encounter (Signed)

## 2019-11-29 ENCOUNTER — Other Ambulatory Visit: Payer: Self-pay

## 2019-11-29 ENCOUNTER — Ambulatory Visit (INDEPENDENT_AMBULATORY_CARE_PROVIDER_SITE_OTHER): Payer: Self-pay | Admitting: Physician Assistant

## 2019-11-29 VITALS — BP 140/70 | HR 59 | Temp 97.1°F | Resp 18 | Ht 68.0 in | Wt 124.6 lb

## 2019-11-29 DIAGNOSIS — N186 End stage renal disease: Secondary | ICD-10-CM

## 2019-11-29 DIAGNOSIS — Z992 Dependence on renal dialysis: Secondary | ICD-10-CM

## 2019-11-29 NOTE — Progress Notes (Addendum)
POST OPERATIVE OFFICE NOTE    CC:  F/u for surgery  HPI:  This is a 69 y.o. 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 Link Snuffer.  Her 1st stage BVT was on 07/15/2019 also by Dr. Carlis Abbott.   Her right femoral TDC was placed 03/01/2019 by Dr. Oneida Alar.   Pt states she does have numbness in the fingers on her right hand.  She thinks it is a little worse and it comes and goes.  She states she has a hard time with gripping objects sometimes.  She does not have any sores on her fingers.  She states she has numbness in the fingers on the left hand all the time.  She states she has soreness in the incisions of the right upper arm.   She states that she has some burning in her right thigh.    The pt is on dialysis M/W/F in [place].   Allergies  Allergen Reactions  . Ace Inhibitors Anaphylaxis and Swelling  . Motrin Ib [Ibuprofen] Anaphylaxis    Current Outpatient Medications  Medication Sig Dispense Refill  . acetaminophen (TYLENOL) 500 MG tablet Take 1,500-2,000 mg by mouth 2 (two) times daily as needed for moderate pain or headache.     . albuterol (PROVENTIL HFA;VENTOLIN HFA) 108 (90 Base) MCG/ACT inhaler Inhale 2 puffs into the lungs every 6 (six) hours as needed for wheezing or shortness of breath.    . ALPRAZolam (XANAX) 0.25 MG tablet Take 1 tablet (0.25 mg total) by mouth at bedtime as needed for anxiety. 30 tablet 0  . amLODipine (NORVASC) 10 MG tablet Take 10 mg by mouth at bedtime.    Marland Kitchen atorvastatin (LIPITOR) 80 MG tablet Take 1 tablet (80 mg total) by mouth daily. (Patient taking differently: Take 80 mg by mouth at bedtime. ) 30 tablet 3  . AURYXIA 1 GM 210 MG(Fe) tablet Take 210 mg by mouth daily.     Marland Kitchen b complex vitamins tablet Take 1 tablet by mouth at bedtime.    . busPIRone (BUSPAR) 15 MG tablet Take 1 tablet (15 mg total) by mouth 2 (two) times daily. 60 tablet 0  . calcitRIOL (ROCALTROL) 0.5 MCG capsule Take 1 capsule (0.5 mcg total) by mouth every Monday, Wednesday, and  Friday with hemodialysis.    Marland Kitchen carvedilol (COREG) 25 MG tablet Take 1 tablet (25 mg total) by mouth 2 (two) times daily. 180 tablet 3  . cholecalciferol (VITAMIN D) 1000 units tablet Take 1,000 Units by mouth at bedtime.     Marland Kitchen dextromethorphan-guaiFENesin (MUCINEX DM) 30-600 MG 12hr tablet Take 1 tablet by mouth 2 (two) times daily as needed for cough. 60 tablet 0  . diphenhydrAMINE (BENADRYL) 25 MG tablet Take 1 tablet (25 mg total) by mouth daily as needed for itching. 30 tablet 0  . doxazosin (CARDURA) 4 MG tablet Take 4-8 mg by mouth See admin instructions. Take one tablet (4 mg) by mouth every morning and two tablets (8 mg) at night    . gabapentin (NEURONTIN) 300 MG capsule Take 1 capsule (300 mg total) by mouth 3 (three) times a week. After dialysis on M/W/F 12 capsule 1  . gabapentin (NEURONTIN) 800 MG tablet Take 800 mg by mouth in the morning, at noon, in the evening, and at bedtime.    . heparin 1000 UNIT/ML injection 1 mL (1,000 Units total) by Intracatheter route every Monday, Wednesday, and Friday with hemodialysis. 1 mL   . hydrALAZINE (APRESOLINE) 100 MG  tablet Take 1 tablet (100 mg total) by mouth 3 (three) times daily. 90 tablet 0  . isosorbide mononitrate (IMDUR) 120 MG 24 hr tablet Take 1 tablet (120 mg total) by mouth at bedtime. 30 tablet 0  . levothyroxine (SYNTHROID) 100 MCG tablet Take 100 mcg by mouth daily before breakfast.    . meclizine (ANTIVERT) 25 MG tablet Take 0.5-1 tablets (12.5-25 mg total) by mouth 3 (three) times daily as needed for dizziness. 10 tablet 0  . mirtazapine (REMERON) 15 MG tablet Take 15 mg by mouth at bedtime.    . montelukast (SINGULAIR) 10 MG tablet Take 1 tablet (10 mg total) by mouth daily. 30 tablet 0  . multivitamin (RENA-VIT) TABS tablet Take 1 tablet by mouth at bedtime.  0  . nitroGLYCERIN (NITROSTAT) 0.4 MG SL tablet Place 0.4 mg under the tongue every 5 (five) minutes as needed for chest pain.    . Nutritional Supplements (FEEDING  SUPPLEMENT, NEPRO CARB STEADY,) LIQD Take 237 mLs by mouth 3 (three) times daily between meals.  0  . oxyCODONE-acetaminophen (PERCOCET) 10-325 MG tablet Take 1 tablet by mouth every 6 (six) hours as needed for pain. 10 tablet 0  . pantoprazole (PROTONIX) 40 MG tablet Take 1 tablet (40 mg total) by mouth 2 (two) times daily before a meal. 60 tablet 0  . polyethylene glycol (MIRALAX / GLYCOLAX) packet Take 17 g by mouth daily. 14 each 0  . traZODone (DESYREL) 100 MG tablet Take 100 mg by mouth at bedtime.    Marland Kitchen warfarin (COUMADIN) 3 MG tablet Take 1 tablet (3 mg total) by mouth daily at 6 PM. 30 tablet 0   No current facility-administered medications for this visit.     ROS:  See HPI  Physical Exam:  Today's Vitals   11/29/19 1110  BP: 140/70  Pulse: (!) 59  Resp: 18  Temp: (!) 97.1 F (36.2 C)  TempSrc: Temporal  SpO2: 99%  Weight: 124 lb 9.6 oz (56.5 kg)  Height: 5\' 8"  (1.727 m)  PainSc: 7    Body mass index is 18.95 kg/m.   Incision:  Well healed. Extremities:  There is a palpable right ulnar pulse.  Motor and sensory are in tact.  She has a strong right hand grip.  There is an excellent thrill present.  She has an easily palpable right DP pulse and the right foot is warm and well perfused without any ulcers.    Assessment/Plan:  This is a 69 y.o. female who is s/p: right 2nd stage BVT on 11/03/2019 by Dr. Link Snuffer.  Her 1st stage BVT was on 07/15/2019 also by Dr. Carlis Abbott.     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  Pt was seen and examined with Dr. Carlis Abbott and he had discussion with her.  She states this is tolerable for now.  She has a strong right hand grip with palpable right ulnar pulse.  She would like to try to go ahead and use the fistula and she will f/u with Dr. Carlis Abbott about 4-6 weeks after they start using the fistula.  She will call us sooner should her sx become intolerable.  Dr. Carlis Abbott discussed with her that if we  ligate that fistula, she will be out of options in her arms for access.   -the fistula can be used 12/14/2019. -If pt has tunneled dialysis catheter and the access has been used successfully to the  satisfaction of the dialysis center, the tunneled catheter can be removed at their discretion.   -the pt will follow up with Dr. Carlis Abbott in 6-8 weeks.   Leontine Locket, [facility] [phone]  Clinic MD:  Pt seen and examined with Dr. Carlis Abbott

## 2019-12-26 ENCOUNTER — Telehealth: Payer: Self-pay

## 2019-12-26 ENCOUNTER — Other Ambulatory Visit: Payer: Self-pay

## 2019-12-26 DIAGNOSIS — Z992 Dependence on renal dialysis: Secondary | ICD-10-CM

## 2019-12-26 DIAGNOSIS — N186 End stage renal disease: Secondary | ICD-10-CM

## 2019-12-26 NOTE — Telephone Encounter (Signed)
Pt called from HD today with c/o difficulties with accessing her HD access and bleeding from it over the weekend. I have scheduled her for a duplex tomorrow and then to see PA in office. Pt is aware and verbalized understanding.

## 2019-12-27 ENCOUNTER — Other Ambulatory Visit: Payer: Self-pay

## 2019-12-27 ENCOUNTER — Ambulatory Visit (HOSPITAL_COMMUNITY)
Admission: RE | Admit: 2019-12-27 | Discharge: 2019-12-27 | Disposition: A | Discharge status: Home / Self Care | Payer: Medicare Other | Source: Ambulatory Visit | Attending: Vascular Surgery | Admitting: Vascular Surgery

## 2019-12-27 ENCOUNTER — Ambulatory Visit (INDEPENDENT_AMBULATORY_CARE_PROVIDER_SITE_OTHER): Payer: Self-pay | Admitting: Physician Assistant

## 2019-12-27 VITALS — BP 121/63 | HR 65 | Temp 97.6°F | Resp 20 | Ht 68.0 in | Wt 123.5 lb

## 2019-12-27 DIAGNOSIS — N186 End stage renal disease: Secondary | ICD-10-CM | POA: Insufficient documentation

## 2019-12-27 DIAGNOSIS — Z992 Dependence on renal dialysis: Secondary | ICD-10-CM

## 2019-12-27 NOTE — Progress Notes (Signed)
POST OPERATIVE OFFICE NOTE    CC:  F/u for surgery  HPI:  This is a 69 y.o. female who is s/p  right 2nd stage BVT on 11/03/2019 by Dr. Link Snuffer.  Her 1st stage BVT was on 07/15/2019 also by Dr. Carlis Abbott.   Her right femoral TDC was placed 03/01/2019 by Dr. Oneida Alar.   She was seen on 11/29/2019 and did have some sx of mild steal sx with numbness in her fingers on the right.  Pt was seen and examined with Dr. Carlis Abbott and he had discussion with her.  She states this is tolerable for now.  She has a strong right hand grip with palpable right ulnar pulse.  She would like to try to go ahead and use the fistula and she will f/u with Dr. Carlis Abbott about 4-6 weeks after they start using the fistula.   She comes back in today with complaints of right arm pain after they tried to use her fistula yesterday.  She states that she had significant swelling afterward.  She states it is still swollen today but much better.  Still sore today.  She states they were able to cannulate with one needle and use the other with the [facility].  They have never been able to use 2 needles with the fistula.  Her right hand pain has improved.   She continues to have some numbness in the left hand.   The pt is on dialysis M/W/F at [facility] center.  Allergies  Allergen Reactions  . Ace Inhibitors Anaphylaxis and Swelling  . Motrin Ib [Ibuprofen] Anaphylaxis    Current Outpatient Medications  Medication Sig Dispense Refill  . acetaminophen (TYLENOL) 500 MG tablet Take 1,500-2,000 mg by mouth 2 (two) times daily as needed for moderate pain or headache.     . albuterol (PROVENTIL HFA;VENTOLIN HFA) 108 (90 Base) MCG/ACT inhaler Inhale 2 puffs into the lungs every 6 (six) hours as needed for wheezing or shortness of breath.    . ALPRAZolam (XANAX) 0.25 MG tablet Take 1 tablet (0.25 mg total) by mouth at bedtime as needed for anxiety. 30 tablet 0  . amLODipine (NORVASC) 10 MG tablet Take 10 mg by mouth at bedtime.    Marland Kitchen atorvastatin (LIPITOR) 80 MG  tablet Take 1 tablet (80 mg total) by mouth daily. (Patient taking differently: Take 80 mg by mouth at bedtime. ) 30 tablet 3  . AURYXIA 1 GM 210 MG(Fe) tablet Take 210 mg by mouth daily.     Marland Kitchen b complex vitamins tablet Take 1 tablet by mouth at bedtime.    . busPIRone (BUSPAR) 15 MG tablet Take 1 tablet (15 mg total) by mouth 2 (two) times daily. 60 tablet 0  . calcitRIOL (ROCALTROL) 0.5 MCG capsule Take 1 capsule (0.5 mcg total) by mouth every Monday, Wednesday, and Friday with hemodialysis.    Marland Kitchen carvedilol (COREG) 25 MG tablet Take 1 tablet (25 mg total) by mouth 2 (two) times daily. 180 tablet 3  . cholecalciferol (VITAMIN D) 1000 units tablet Take 1,000 Units by mouth at bedtime.     Marland Kitchen dextromethorphan-guaiFENesin (MUCINEX DM) 30-600 MG 12hr tablet Take 1 tablet by mouth 2 (two) times daily as needed for cough. 60 tablet 0  . diphenhydrAMINE (BENADRYL) 25 MG tablet Take 1 tablet (25 mg total) by mouth daily as needed for itching. 30 tablet 0  . doxazosin (CARDURA) 4 MG tablet Take 4-8 mg by mouth See admin instructions. Take one tablet (4 mg) by mouth every morning  and two tablets (8 mg) at night    . gabapentin (NEURONTIN) 300 MG capsule Take 1 capsule (300 mg total) by mouth 3 (three) times a week. After dialysis on M/W/F 12 capsule 1  . gabapentin (NEURONTIN) 800 MG tablet Take 800 mg by mouth in the morning, at noon, in the evening, and at bedtime.    . heparin 1000 UNIT/ML injection 1 mL (1,000 Units total) by Intracatheter route every Monday, Wednesday, and Friday with hemodialysis. 1 mL   . hydrALAZINE (APRESOLINE) 100 MG tablet Take 1 tablet (100 mg total) by mouth 3 (three) times daily. 90 tablet 0  . isosorbide mononitrate (IMDUR) 120 MG 24 hr tablet Take 1 tablet (120 mg total) by mouth at bedtime. 30 tablet 0  . levothyroxine (SYNTHROID) 100 MCG tablet Take 100 mcg by mouth daily before breakfast.    . meclizine (ANTIVERT) 25 MG tablet Take 0.5-1 tablets (12.5-25 mg total) by mouth 3  (three) times daily as needed for dizziness. 10 tablet 0  . mirtazapine (REMERON) 15 MG tablet Take 15 mg by mouth at bedtime.    . montelukast (SINGULAIR) 10 MG tablet Take 1 tablet (10 mg total) by mouth daily. 30 tablet 0  . multivitamin (RENA-VIT) TABS tablet Take 1 tablet by mouth at bedtime.  0  . nitroGLYCERIN (NITROSTAT) 0.4 MG SL tablet Place 0.4 mg under the tongue every 5 (five) minutes as needed for chest pain.    . Nutritional Supplements (FEEDING SUPPLEMENT, NEPRO CARB STEADY,) LIQD Take 237 mLs by mouth 3 (three) times daily between meals.  0  . oxyCODONE-acetaminophen (PERCOCET) 10-325 MG tablet Take 1 tablet by mouth every 6 (six) hours as needed for pain. 10 tablet 0  . pantoprazole (PROTONIX) 40 MG tablet Take 1 tablet (40 mg total) by mouth 2 (two) times daily before a meal. 60 tablet 0  . polyethylene glycol (MIRALAX / GLYCOLAX) packet Take 17 g by mouth daily. 14 each 0  . traZODone (DESYREL) 100 MG tablet Take 100 mg by mouth at bedtime.    Marland Kitchen warfarin (COUMADIN) 3 MG tablet Take 1 tablet (3 mg total) by mouth daily at 6 PM. 30 tablet 0   No current facility-administered medications for this visit.     ROS:  See HPI  Physical Exam:  Today's Vitals   12/27/19 1122 12/27/19 1123  BP:  121/63  Pulse:  65  Resp:  20  Temp:  97.6 F (36.4 C)  TempSrc:  Temporal  SpO2:  96%  Weight:  123 lb 8 oz (56 kg)  Height:  5\' 8"  (1.727 m)  PainSc: 9  9    Body mass index is 18.78 kg/m.   Incision:  healed Extremities:  There is a faintly palpable right radial pulse and bounding palpable right ulnar pulse. .  Motor and sensory are in tact.  There is a thrill present.  There is some swelling around the fistula.    Dialysis Duplex on 12/27/2019: OUTFLOW VEINPSV (cm/s)Diameter (cm)Depth (cm)Describe  +------------+----------+-------------+----------+--------+  axilla     599    0.26                +------------+----------+-------------+----------+--------+  Prox UA     211    0.63     0.50        +------------+----------+-------------+----------+--------+  Mid UA     384    0.60     0.31        +------------+----------+-------------+----------+--------+  Dist UA  108    0.80     0.90        +------------+----------+-------------+----------+--------+  AC Fossa    285    0.63     0.64        +------------+----------+-------------+----------+--------+   Summary:  Patent arteriovenous fistula with a mild-moderate narrowing of the basilic outflow vein near the axilla.   Assessment/Plan:  This is a 69 y.o. female who is s/p: right 2nd stage BVT on 11/03/2019 by Dr. Link Snuffer.  Her 1st stage BVT was on 07/15/2019 also by Dr. Carlis Abbott.   Her right femoral TDC was placed 03/01/2019 by Dr. Oneida Alar.    -unable to cannulate fistula. She had an infiltration yesterday so would hold on trying to access the fistula to allow the swelling to improve and use her catheter until after her fistulogram.  Will plan for fistulogram next week by Dr. Carlis Abbott to evaluate fistula.  Her coumadin will need to be held.  She is in agreement with this plan. -her right hand pain has improved and she has a bounding right ulnar palpable pulse.    Leontine Locket, [facility] [phone]  Clinic MD:  Carlis Abbott

## 2020-01-05 ENCOUNTER — Ambulatory Visit: Payer: Medicare Other | Admitting: Neurology

## 2020-01-10 ENCOUNTER — Other Ambulatory Visit: Payer: Self-pay

## 2020-01-10 ENCOUNTER — Ambulatory Visit (INDEPENDENT_AMBULATORY_CARE_PROVIDER_SITE_OTHER): Payer: Self-pay | Admitting: Vascular Surgery

## 2020-01-10 DIAGNOSIS — N186 End stage renal disease: Secondary | ICD-10-CM

## 2020-01-10 DIAGNOSIS — Z992 Dependence on renal dialysis: Secondary | ICD-10-CM

## 2020-01-10 NOTE — Progress Notes (Signed)
No charge for today's visit.  She is already scheduled for right arm fistulogram with me on Thursday after recently being seen by a PA here in our office to arrange procedure with no new concerns today.  Marty Heck, MD [facility] [place] Office: [place]

## 2020-01-12 ENCOUNTER — Encounter (HOSPITAL_COMMUNITY)
Admission: RE | Disposition: A | Discharge status: Home / Self Care | Payer: Self-pay | Source: Home / Self Care | Attending: Vascular Surgery

## 2020-01-12 ENCOUNTER — Ambulatory Visit (HOSPITAL_COMMUNITY)
Admission: RE | Admit: 2020-01-12 | Discharge: 2020-01-12 | Disposition: A | Discharge status: Home / Self Care | Payer: Medicare Other | Attending: Vascular Surgery | Admitting: Vascular Surgery

## 2020-01-12 ENCOUNTER — Encounter (HOSPITAL_COMMUNITY): Payer: Self-pay | Admitting: Vascular Surgery

## 2020-01-12 DIAGNOSIS — N186 End stage renal disease: Secondary | ICD-10-CM

## 2020-01-12 DIAGNOSIS — T82858A Stenosis of vascular prosthetic devices, implants and grafts, initial encounter: Secondary | ICD-10-CM | POA: Diagnosis present

## 2020-01-12 DIAGNOSIS — Y841 Kidney dialysis as the cause of abnormal reaction of the patient, or of later complication, without mention of misadventure at the time of the procedure: Secondary | ICD-10-CM | POA: Diagnosis not present

## 2020-01-12 DIAGNOSIS — Z992 Dependence on renal dialysis: Secondary | ICD-10-CM | POA: Insufficient documentation

## 2020-01-12 DIAGNOSIS — T82898A Other specified complication of vascular prosthetic devices, implants and grafts, initial encounter: Secondary | ICD-10-CM

## 2020-01-12 DIAGNOSIS — Z79899 Other long term (current) drug therapy: Secondary | ICD-10-CM | POA: Insufficient documentation

## 2020-01-12 DIAGNOSIS — Z7901 Long term (current) use of anticoagulants: Secondary | ICD-10-CM | POA: Diagnosis not present

## 2020-01-12 DIAGNOSIS — Z79890 Hormone replacement therapy (postmenopausal): Secondary | ICD-10-CM | POA: Diagnosis not present

## 2020-01-12 HISTORY — PX: PERIPHERAL VASCULAR BALLOON ANGIOPLASTY: CATH118281

## 2020-01-12 HISTORY — PX: A/V FISTULAGRAM: CATH118298

## 2020-01-12 LAB — POCT I-STAT, CHEM 8
BUN: 20 mg/dL (ref 8–23)
Calcium, Ion: 1.25 mmol/L (ref 1.15–1.40)
Chloride: 97 mmol/L — ABNORMAL LOW (ref 98–111)
Creatinine, Ser: 3.6 mg/dL — ABNORMAL HIGH (ref 0.44–1.00)
Glucose, Bld: 137 mg/dL — ABNORMAL HIGH (ref 70–99)
HCT: 38 % (ref 36.0–46.0)
Hemoglobin: 12.9 g/dL (ref 12.0–15.0)
Potassium: 3.5 mmol/L (ref 3.5–5.1)
Sodium: 141 mmol/L (ref 135–145)
TCO2: 29 mmol/L (ref 22–32)

## 2020-01-12 LAB — PROTIME-INR
INR: 1.1 (ref 0.8–1.2)
Prothrombin Time: 13.9 seconds (ref 11.4–15.2)

## 2020-01-12 SURGERY — A/V FISTULAGRAM
Anesthesia: LOCAL | Laterality: Right

## 2020-01-12 MED ORDER — FENTANYL CITRATE (PF) 100 MCG/2ML IJ SOLN
INTRAMUSCULAR | Status: AC
  Filled 2020-01-12: qty 2

## 2020-01-12 MED ORDER — MIDAZOLAM HCL 2 MG/2ML IJ SOLN
INTRAMUSCULAR | Status: DC | PRN
  Administered 2020-01-12: 1 mg via INTRAVENOUS

## 2020-01-12 MED ORDER — ACETAMINOPHEN 325 MG PO TABS
650.0000 mg | ORAL_TABLET | Freq: Once | ORAL | Status: AC
  Administered 2020-01-12: 650 mg via ORAL

## 2020-01-12 MED ORDER — SODIUM CHLORIDE 0.9% FLUSH: 3.0000 mL | Freq: Two times a day (BID) | INTRAVENOUS | Status: DC

## 2020-01-12 MED ORDER — MIDAZOLAM HCL 2 MG/2ML IJ SOLN
INTRAMUSCULAR | Status: AC
  Filled 2020-01-12: qty 2

## 2020-01-12 MED ORDER — SODIUM CHLORIDE 0.9% FLUSH: 3.0000 mL | INTRAVENOUS | Status: DC | PRN

## 2020-01-12 MED ORDER — SODIUM CHLORIDE 0.9 % IV SOLN: 250.0000 mL | INTRAVENOUS | Status: DC | PRN

## 2020-01-12 MED ORDER — HEPARIN SODIUM (PORCINE) 1000 UNIT/ML IJ SOLN
INTRAMUSCULAR | Status: DC | PRN
  Administered 2020-01-12: 3000 [IU] via INTRAVENOUS

## 2020-01-12 MED ORDER — LIDOCAINE HCL (PF) 1 % IJ SOLN
INTRAMUSCULAR | Status: DC | PRN
  Administered 2020-01-12: 2 mL

## 2020-01-12 MED ORDER — HEPARIN SODIUM (PORCINE) 1000 UNIT/ML IJ SOLN
INTRAMUSCULAR | Status: AC
  Filled 2020-01-12: qty 1

## 2020-01-12 MED ORDER — ACETAMINOPHEN 325 MG PO TABS
ORAL_TABLET | ORAL | Status: AC
  Filled 2020-01-12: qty 2

## 2020-01-12 MED ORDER — FENTANYL CITRATE (PF) 100 MCG/2ML IJ SOLN
INTRAMUSCULAR | Status: DC | PRN
  Administered 2020-01-12: 25 ug via INTRAVENOUS

## 2020-01-12 MED ORDER — LIDOCAINE HCL (PF) 1 % IJ SOLN
INTRAMUSCULAR | Status: AC
  Filled 2020-01-12: qty 30

## 2020-01-12 MED ORDER — HEPARIN (PORCINE) IN NACL 1000-0.9 UT/500ML-% IV SOLN
INTRAVENOUS | Status: DC | PRN
  Administered 2020-01-12: 500 mL

## 2020-01-12 MED ORDER — IODIXANOL 320 MG/ML IV SOLN
INTRAVENOUS | Status: DC | PRN
  Administered 2020-01-12: 40 mL

## 2020-01-12 SURGICAL SUPPLY — 17 items
BAG SNAP BAND KOVER 36X36 (MISCELLANEOUS) ×2 IMPLANT
BALLN MUSTANG 6.0X100X75 (BALLOONS) ×2
BALLOON MUSTANG 6.0X100X75 (BALLOONS) ×1 IMPLANT
COVER DOME SNAP 22 D (MISCELLANEOUS) ×2 IMPLANT
DCB RANGER 7.0X100 135 (BALLOONS) ×1 IMPLANT
KIT ENCORE 26 ADVANTAGE (KITS) ×2 IMPLANT
KIT MICROPUNCTURE NIT STIFF (SHEATH) ×2 IMPLANT
PROTECTION STATION PRESSURIZED (MISCELLANEOUS) ×2
RANGER DCB 7.0X100 135 (BALLOONS) ×2
SHEATH PINNACLE R/O II 6F 4CM (SHEATH) ×2 IMPLANT
SHEATH PROBE COVER 6X72 (BAG) ×2 IMPLANT
STATION PROTECTION PRESSURIZED (MISCELLANEOUS) ×1 IMPLANT
STOPCOCK MORSE 400PSI 3WAY (MISCELLANEOUS) ×2 IMPLANT
TRAY PV CATH (CUSTOM PROCEDURE TRAY) ×2 IMPLANT
TUBING CIL FLEX 10 FLL-RA (TUBING) ×2 IMPLANT
WIRE BENTSON .035X145CM (WIRE) ×2 IMPLANT
WIRE G V18X300CM (WIRE) ×2 IMPLANT

## 2020-01-12 NOTE — Op Note (Signed)
    OPERATIVE NOTE   PROCEDURE: 1. right arteriovenous fistula cannulation under ultrasound guidance 2. right arm fistulogram including central venogram 3. right peripheral venoplasty of basilic vein (6 mm x 127 mm Mustang and 7 mm x 100 mm drug coated Ranger)   PRE-OPERATIVE DIAGNOSIS: Malfunctioning right arteriovenous fistula  POST-OPERATIVE DIAGNOSIS: same as above   SURGEON: Marty Heck, MD  ANESTHESIA: local  ESTIMATED BLOOD LOSS: 5 cc  FINDING(S): 1. Right upper arm basilic vein had multiple stenotic segments greater than 60 to 70%.  Ultimately this entire area was angioplastied with a 6 mm Mustang and 7 mm drug-coated Ranger.  There was one small area of extravasation but after prolonged balloon inflation at low atmospheres this appeared to resolve.  She had no hematoma noted on her arm and fistula remained a good thrill.  SPECIMEN(S):  None  CONTRAST: 35 cc  INDICATIONS: Jocelyn Hill is a 69 y.o. female who  presents with malfunctioning right brachiobascilic arteriovenous fistula.  The patient is scheduled for right arm fistulogram.  The patient is aware the risks include but are not limited to: bleeding, infection, thrombosis of the cannulated access, and possible anaphylactic reaction to the contrast.  The patient is aware of the risks of the procedure and elects to proceed forward.  DESCRIPTION: After full informed written consent was obtained, the patient was brought back to the angiography suite and placed supine upon the angiography table.  The patient was connected to monitoring equipment.  The right arm was prepped and draped in the standard fashion for a right arm fistulogram.  Under ultrasound guidance, the right arteriovenous fistula was evaluated, it was patent, an image was saved.  It was cannulated with a micropuncture needle.  The microwire was advanced into the fistula and the needle was exchanged for the a microsheath, which was lodged 2 cm into the  access.  The wire was removed and the sheath was connected to the IV extension tubing.  Hand injections were completed to image the access from the antecubitum up to the level of axilla.  The central venous structures were also imaged by hand injections.  After review images used a Bentson wire to exchange for a short 6 [place] sheath.  Patient was given 3000 units of IV heparin.  We then performed angioplasty of the basilic vein where it was stenotic with a 6 mm x 100 mm Mustang and upsized to a 7 mm x 100 mm drug-coated Ranger.  There was one small area of extravasation after prolonged inflation at low atmospheres this appeared to resolve.  She had no overt hematoma or other pain on her upper arm.  A 4-0 Monocryl purse-string suture was sewn around the sheath.  The sheath was removed while tying down the suture.  A sterile bandage was applied to the puncture site. We also placed a light pressure wrap on her upper arm.  COMPLICATIONS: None  CONDITION: Stable  Marty Heck, MD [facility]: [phone]  01/12/2020 9:41 AM

## 2020-01-12 NOTE — H&P (Signed)
History and Physical Interval Note:  01/12/2020 8:05 AM  Jocelyn Hill  has presented today for surgery, with the diagnosis of instage renal.  The various methods of treatment have been discussed with the patient and family. After consideration of risks, benefits and other options for treatment, the patient has consented to  Procedure(s): A/V FISTULAGRAM (Right) as a surgical intervention.  The patient's history has been reviewed, patient examined, no change in status, stable for surgery.  I have reviewed the patient's chart and labs.  Questions were answered to the patient's satisfaction.    Right arm fistulogram.  Marty Heck  CC:  F/u for surgery  HPI:  This is a 69 y.o. 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 Link Snuffer.Her 1st stage BVT was on 07/15/2019 also by Dr. Carlis Abbott.Her right femoral TDC was placed 03/01/2019 by Dr. Oneida Alar.  She was seen on 11/29/2019 and did have some sx of mild steal sx with numbness in her fingers on the right. Pt was seen and examined with Dr. Carlis Abbott and he had discussion with her. She states this is tolerable for now. She has a strong right hand grip with palpable right ulnar pulse. She would like to try to go ahead and use the fistula and she will f/u with Dr. Carlis Abbott about 4-6 weeks after they start using the fistula.   She comes back in today with complaints of right arm pain after they tried to use her fistula yesterday.  She states that she had significant swelling afterward.  She states it is still swollen today but much better.  Still sore today.  She states they were able to cannulate with one needle and use the other with the [facility].  They have never been able to use 2 needles with the fistula.  Her right hand pain has improved.   She continues to have some numbness in the left hand.   The pt is on dialysis M/W/F at [facility] center.      Allergies  Allergen Reactions  . Ace Inhibitors Anaphylaxis and Swelling  . Motrin Ib  [Ibuprofen] Anaphylaxis          Current Outpatient Medications  Medication Sig Dispense Refill  . acetaminophen (TYLENOL) 500 MG tablet Take 1,500-2,000 mg by mouth 2 (two) times daily as needed for moderate pain or headache.     . albuterol (PROVENTIL HFA;VENTOLIN HFA) 108 (90 Base) MCG/ACT inhaler Inhale 2 puffs into the lungs every 6 (six) hours as needed for wheezing or shortness of breath.    . ALPRAZolam (XANAX) 0.25 MG tablet Take 1 tablet (0.25 mg total) by mouth at bedtime as needed for anxiety. 30 tablet 0  . amLODipine (NORVASC) 10 MG tablet Take 10 mg by mouth at bedtime.    Marland Kitchen atorvastatin (LIPITOR) 80 MG tablet Take 1 tablet (80 mg total) by mouth daily. (Patient taking differently: Take 80 mg by mouth at bedtime. ) 30 tablet 3  . AURYXIA 1 GM 210 MG(Fe) tablet Take 210 mg by mouth daily.     Marland Kitchen b complex vitamins tablet Take 1 tablet by mouth at bedtime.    . busPIRone (BUSPAR) 15 MG tablet Take 1 tablet (15 mg total) by mouth 2 (two) times daily. 60 tablet 0  . calcitRIOL (ROCALTROL) 0.5 MCG capsule Take 1 capsule (0.5 mcg total) by mouth every Monday, Wednesday, and Friday with hemodialysis.    Marland Kitchen carvedilol (COREG) 25 MG tablet Take 1 tablet (25 mg total) by mouth  2 (two) times daily. 180 tablet 3  . cholecalciferol (VITAMIN D) 1000 units tablet Take 1,000 Units by mouth at bedtime.     Marland Kitchen dextromethorphan-guaiFENesin (MUCINEX DM) 30-600 MG 12hr tablet Take 1 tablet by mouth 2 (two) times daily as needed for cough. 60 tablet 0  . diphenhydrAMINE (BENADRYL) 25 MG tablet Take 1 tablet (25 mg total) by mouth daily as needed for itching. 30 tablet 0  . doxazosin (CARDURA) 4 MG tablet Take 4-8 mg by mouth See admin instructions. Take one tablet (4 mg) by mouth every morning and two tablets (8 mg) at night    . gabapentin (NEURONTIN) 300 MG capsule Take 1 capsule (300 mg total) by mouth 3 (three) times a week. After dialysis on M/W/F 12 capsule 1  . gabapentin  (NEURONTIN) 800 MG tablet Take 800 mg by mouth in the morning, at noon, in the evening, and at bedtime.    . heparin 1000 UNIT/ML injection 1 mL (1,000 Units total) by Intracatheter route every Monday, Wednesday, and Friday with hemodialysis. 1 mL   . hydrALAZINE (APRESOLINE) 100 MG tablet Take 1 tablet (100 mg total) by mouth 3 (three) times daily. 90 tablet 0  . isosorbide mononitrate (IMDUR) 120 MG 24 hr tablet Take 1 tablet (120 mg total) by mouth at bedtime. 30 tablet 0  . levothyroxine (SYNTHROID) 100 MCG tablet Take 100 mcg by mouth daily before breakfast.    . meclizine (ANTIVERT) 25 MG tablet Take 0.5-1 tablets (12.5-25 mg total) by mouth 3 (three) times daily as needed for dizziness. 10 tablet 0  . mirtazapine (REMERON) 15 MG tablet Take 15 mg by mouth at bedtime.    . montelukast (SINGULAIR) 10 MG tablet Take 1 tablet (10 mg total) by mouth daily. 30 tablet 0  . multivitamin (RENA-VIT) TABS tablet Take 1 tablet by mouth at bedtime.  0  . nitroGLYCERIN (NITROSTAT) 0.4 MG SL tablet Place 0.4 mg under the tongue every 5 (five) minutes as needed for chest pain.    . Nutritional Supplements (FEEDING SUPPLEMENT, NEPRO CARB STEADY,) LIQD Take 237 mLs by mouth 3 (three) times daily between meals.  0  . oxyCODONE-acetaminophen (PERCOCET) 10-325 MG tablet Take 1 tablet by mouth every 6 (six) hours as needed for pain. 10 tablet 0  . pantoprazole (PROTONIX) 40 MG tablet Take 1 tablet (40 mg total) by mouth 2 (two) times daily before a meal. 60 tablet 0  . polyethylene glycol (MIRALAX / GLYCOLAX) packet Take 17 g by mouth daily. 14 each 0  . traZODone (DESYREL) 100 MG tablet Take 100 mg by mouth at bedtime.    Marland Kitchen warfarin (COUMADIN) 3 MG tablet Take 1 tablet (3 mg total) by mouth daily at 6 PM. 30 tablet 0   No current facility-administered medications for this visit.     ROS:  See HPI  Physical Exam:      Today's Vitals   12/27/19 1122 12/27/19 1123  BP:  121/63  Pulse:   65  Resp:  20  Temp:  97.6 F (36.4 C)  TempSrc:  Temporal  SpO2:  96%  Weight:  123 lb 8 oz (56 kg)  Height:  5\' 8"  (1.727 m)  PainSc: 9  9    Body mass index is 18.78 kg/m.   Incision:  healed Extremities:  There is a faintly palpable right radial pulse and bounding palpable right ulnar pulse. .  Motor and sensory are in tact.  There is a thrill present.  There  is some swelling around the fistula.    Dialysis Duplex on 12/27/2019: OUTFLOW VEINPSV (cm/s)Diameter (cm)Depth (cm)Describe  +------------+----------+-------------+----------+--------+  axilla     599    0.26              +------------+----------+-------------+----------+--------+  Prox UA     211    0.63     0.50        +------------+----------+-------------+----------+--------+  Mid UA     384    0.60     0.31        +------------+----------+-------------+----------+--------+  Dist UA     108    0.80     0.90        +------------+----------+-------------+----------+--------+  AC Fossa    285    0.63     0.64        +------------+----------+-------------+----------+--------+   Summary:  Patent arteriovenous fistula with a mild-moderate narrowing of the basilic outflow vein near the axilla.   Assessment/Plan:  This is a 69 y.o. 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 Link Snuffer.Her 1st stage BVT was on 07/15/2019 also by Dr. Carlis Abbott.Her right femoral TDC was placed 03/01/2019 by Dr. Oneida Alar.   -unable to cannulate fistula. She had an infiltration yesterday so would hold on trying to access the fistula to allow the swelling to improve and use her catheter until after her fistulogram.  Will plan for fistulogram next week by Dr. Carlis Abbott to evaluate fistula.  Her coumadin will need to be held.  She is in agreement with this plan. -her right hand pain has improved and she has a  bounding right ulnar palpable pulse.    Leontine Locket, [facility] [phone]  Clinic MD:  Carlis Abbott

## 2020-01-12 NOTE — Discharge Instructions (Signed)
° °  [facility] [place] ° °Discharge Instructions ° °AV Fistula or Graft Surgery for Dialysis Access ° °Please refer to the following instructions for your post-procedure care. Your surgeon or physician assistant will discuss any changes with you. ° °Activity ° °You may drive the day following your surgery, if you are comfortable and no longer taking prescription pain medication. Resume full activity as the soreness in your incision resolves. ° °Bathing/Showering ° °You may shower after you go home. Keep your incision dry for 48 hours. Do not soak in a bathtub, hot tub, or swim until the incision heals completely. You may not shower if you have a hemodialysis catheter. ° °Incision Care ° °Clean your incision with mild soap and water after 48 hours. Pat the area dry with a clean towel. You do not need a bandage unless otherwise instructed. Do not apply any ointments or creams to your incision. You may have skin glue on your incision. Do not peel it off. It will come off on its own in about one week. Your arm may swell a bit after surgery. To reduce swelling use pillows to elevate your arm so it is above your heart. Your doctor will tell you if you need to lightly wrap your arm with an ACE bandage. ° °Diet ° °Resume your normal diet. There are not special food restrictions following this procedure. In order to heal from your surgery, it is CRITICAL to get adequate nutrition. Your body requires vitamins, minerals, and protein. Vegetables are the best source of vitamins and minerals. Vegetables also provide the perfect balance of protein. Processed food has little nutritional value, so try to avoid this. ° °Medications ° °Resume taking all of your medications. If your incision is causing pain, you may take over-the counter pain relievers such as acetaminophen (Tylenol). If you were prescribed a stronger pain medication, please be aware these medications can cause nausea and constipation. Prevent  nausea by taking the medication with a snack or meal. Avoid constipation by drinking plenty of fluids and eating foods with high amount of fiber, such as fruits, vegetables, and grains. Do not take Tylenol if you are taking prescription pain medications. ° ° ° ° °Follow up °Your surgeon may want to see you in the office following your access surgery. If so, this will be arranged at the time of your surgery. ° °Please call us immediately for any of the following conditions: ° °Increased pain, redness, drainage (pus) from your incision site °Fever of 101 degrees or higher °Severe or worsening pain at your incision site °Hand pain or numbness. ° °Reduce your risk of vascular disease: ° °Stop smoking. If you would like help, call [facility] at 1-800-QUIT-NOW (1-800-784-8669) or [place] at [phone] ° °Manage your cholesterol °Maintain a desired weight °Control your diabetes °Keep your blood pressure down ° °Dialysis ° °It will take several weeks to several months for your new dialysis access to be ready for use. Your surgeon will determine when it is OK to use it. Your nephrologist will continue to direct your dialysis. You can continue to use your Permcath until your new access is ready for use. ° °If you have any questions, please call the office at [phone]. ° °

## 2020-01-13 ENCOUNTER — Encounter (HOSPITAL_COMMUNITY): Payer: Medicare Other | Admitting: Cardiology

## 2020-01-24 ENCOUNTER — Ambulatory Visit (INDEPENDENT_AMBULATORY_CARE_PROVIDER_SITE_OTHER): Payer: Medicare Other | Admitting: *Deleted

## 2020-01-24 DIAGNOSIS — I442 Atrioventricular block, complete: Secondary | ICD-10-CM | POA: Diagnosis not present

## 2020-01-24 LAB — CUP PACEART REMOTE DEVICE CHECK
Battery Remaining Longevity: 90 mo
Battery Remaining Percentage: 95.5 %
Battery Voltage: 2.98 V
Brady Statistic AP VP Percent: 66 %
Brady Statistic AP VS Percent: 1 %
Brady Statistic AS VP Percent: 33 %
Brady Statistic AS VS Percent: 1 %
Brady Statistic RA Percent Paced: 65 %
Date Time Interrogation Session: 20210727020012
Implantable Lead Implant Date: 20190830
Implantable Lead Implant Date: 20190830
Implantable Lead Implant Date: 20190830
Implantable Lead Location: 753858
Implantable Lead Location: 753859
Implantable Lead Location: 753860
Implantable Pulse Generator Implant Date: 20190830
Lead Channel Impedance Value: 340 Ohm
Lead Channel Impedance Value: 400 Ohm
Lead Channel Impedance Value: 690 Ohm
Lead Channel Pacing Threshold Amplitude: 0.5 V
Lead Channel Pacing Threshold Amplitude: 0.625 V
Lead Channel Pacing Threshold Amplitude: 1.125 V
Lead Channel Pacing Threshold Pulse Width: 0.5 ms
Lead Channel Pacing Threshold Pulse Width: 0.5 ms
Lead Channel Pacing Threshold Pulse Width: 0.8 ms
Lead Channel Sensing Intrinsic Amplitude: 1.9 mV
Lead Channel Sensing Intrinsic Amplitude: 8.7 mV
Lead Channel Setting Pacing Amplitude: 1.5 V
Lead Channel Setting Pacing Amplitude: 2 V
Lead Channel Setting Pacing Amplitude: 2.125
Lead Channel Setting Pacing Pulse Width: 0.5 ms
Lead Channel Setting Pacing Pulse Width: 0.8 ms
Lead Channel Setting Sensing Sensitivity: 2 mV
Pulse Gen Model: 3562
Pulse Gen Serial Number: 9043873

## 2020-01-26 NOTE — Progress Notes (Signed)
Remote pacemaker transmission.   

## 2020-03-02 ENCOUNTER — Encounter (HOSPITAL_COMMUNITY): Payer: Self-pay | Admitting: Cardiology

## 2020-03-02 ENCOUNTER — Ambulatory Visit (HOSPITAL_COMMUNITY)
Admission: RE | Admit: 2020-03-02 | Discharge: 2020-03-02 | Disposition: A | Discharge status: Home / Self Care | Payer: Medicare Other | Source: Ambulatory Visit | Attending: Cardiology | Admitting: Cardiology

## 2020-03-02 ENCOUNTER — Other Ambulatory Visit: Payer: Self-pay

## 2020-03-02 VITALS — BP 158/72 | HR 60 | Ht 69.0 in | Wt 124.0 lb

## 2020-03-02 DIAGNOSIS — Z951 Presence of aortocoronary bypass graft: Secondary | ICD-10-CM | POA: Insufficient documentation

## 2020-03-02 DIAGNOSIS — Z953 Presence of xenogenic heart valve: Secondary | ICD-10-CM | POA: Diagnosis not present

## 2020-03-02 DIAGNOSIS — I480 Paroxysmal atrial fibrillation: Secondary | ICD-10-CM | POA: Insufficient documentation

## 2020-03-02 DIAGNOSIS — Z79890 Hormone replacement therapy (postmenopausal): Secondary | ICD-10-CM | POA: Diagnosis not present

## 2020-03-02 DIAGNOSIS — Z7901 Long term (current) use of anticoagulants: Secondary | ICD-10-CM | POA: Diagnosis not present

## 2020-03-02 DIAGNOSIS — I2510 Atherosclerotic heart disease of native coronary artery without angina pectoris: Secondary | ICD-10-CM | POA: Insufficient documentation

## 2020-03-02 DIAGNOSIS — R079 Chest pain, unspecified: Secondary | ICD-10-CM

## 2020-03-02 DIAGNOSIS — R0989 Other specified symptoms and signs involving the circulatory and respiratory systems: Secondary | ICD-10-CM | POA: Diagnosis not present

## 2020-03-02 DIAGNOSIS — K219 Gastro-esophageal reflux disease without esophagitis: Secondary | ICD-10-CM | POA: Insufficient documentation

## 2020-03-02 DIAGNOSIS — I255 Ischemic cardiomyopathy: Secondary | ICD-10-CM | POA: Insufficient documentation

## 2020-03-02 DIAGNOSIS — I5022 Chronic systolic (congestive) heart failure: Secondary | ICD-10-CM | POA: Insufficient documentation

## 2020-03-02 DIAGNOSIS — Z950 Presence of cardiac pacemaker: Secondary | ICD-10-CM | POA: Insufficient documentation

## 2020-03-02 DIAGNOSIS — R2689 Other abnormalities of gait and mobility: Secondary | ICD-10-CM | POA: Diagnosis not present

## 2020-03-02 DIAGNOSIS — I340 Nonrheumatic mitral (valve) insufficiency: Secondary | ICD-10-CM | POA: Diagnosis not present

## 2020-03-02 DIAGNOSIS — Z87891 Personal history of nicotine dependence: Secondary | ICD-10-CM | POA: Insufficient documentation

## 2020-03-02 DIAGNOSIS — Z79899 Other long term (current) drug therapy: Secondary | ICD-10-CM | POA: Diagnosis not present

## 2020-03-02 DIAGNOSIS — Z8673 Personal history of transient ischemic attack (TIA), and cerebral infarction without residual deficits: Secondary | ICD-10-CM | POA: Diagnosis not present

## 2020-03-02 DIAGNOSIS — Z992 Dependence on renal dialysis: Secondary | ICD-10-CM | POA: Diagnosis not present

## 2020-03-02 DIAGNOSIS — E1122 Type 2 diabetes mellitus with diabetic chronic kidney disease: Secondary | ICD-10-CM | POA: Diagnosis not present

## 2020-03-02 DIAGNOSIS — Z8774 Personal history of (corrected) congenital malformations of heart and circulatory system: Secondary | ICD-10-CM | POA: Diagnosis not present

## 2020-03-02 DIAGNOSIS — F329 Major depressive disorder, single episode, unspecified: Secondary | ICD-10-CM | POA: Diagnosis not present

## 2020-03-02 DIAGNOSIS — I132 Hypertensive heart and chronic kidney disease with heart failure and with stage 5 chronic kidney disease, or end stage renal disease: Secondary | ICD-10-CM | POA: Insufficient documentation

## 2020-03-02 DIAGNOSIS — I442 Atrioventricular block, complete: Secondary | ICD-10-CM | POA: Insufficient documentation

## 2020-03-02 DIAGNOSIS — F419 Anxiety disorder, unspecified: Secondary | ICD-10-CM | POA: Insufficient documentation

## 2020-03-02 DIAGNOSIS — I252 Old myocardial infarction: Secondary | ICD-10-CM | POA: Diagnosis not present

## 2020-03-02 DIAGNOSIS — E785 Hyperlipidemia, unspecified: Secondary | ICD-10-CM | POA: Insufficient documentation

## 2020-03-02 DIAGNOSIS — Z886 Allergy status to analgesic agent status: Secondary | ICD-10-CM | POA: Insufficient documentation

## 2020-03-02 DIAGNOSIS — I5042 Chronic combined systolic (congestive) and diastolic (congestive) heart failure: Secondary | ICD-10-CM

## 2020-03-02 DIAGNOSIS — Z8249 Family history of ischemic heart disease and other diseases of the circulatory system: Secondary | ICD-10-CM | POA: Insufficient documentation

## 2020-03-02 DIAGNOSIS — N186 End stage renal disease: Secondary | ICD-10-CM | POA: Insufficient documentation

## 2020-03-02 LAB — LIPID PANEL
Cholesterol: 97 mg/dL (ref 0–200)
HDL: 50 mg/dL (ref >40)
LDL Cholesterol: 30 mg/dL (ref 0–99)
Total CHOL/HDL Ratio: 1.9 RATIO
Triglycerides: 87 mg/dL (ref <150)
VLDL: 17 mg/dL (ref 0–40)

## 2020-03-02 LAB — CBC
HCT: 36.7 % (ref 36.0–46.0)
Hemoglobin: 11.9 g/dL — ABNORMAL LOW (ref 12.0–15.0)
MCH: 28.6 pg (ref 26.0–34.0)
MCHC: 32.4 g/dL (ref 30.0–36.0)
MCV: 88.2 fL (ref 80.0–100.0)
Platelets: 166 10*3/uL (ref 150–400)
RBC: 4.16 MIL/uL (ref 3.87–5.11)
RDW: 13.1 % (ref 11.5–15.5)
WBC: 8.6 10*3/uL (ref 4.0–10.5)
nRBC: 0 % (ref 0.0–0.2)

## 2020-03-02 MED ORDER — CARVEDILOL 25 MG PO TABS: 25.0000 mg | ORAL_TABLET | Freq: Two times a day (BID) | ORAL | 3 refills | Status: AC

## 2020-03-02 MED ORDER — HYDRALAZINE HCL 100 MG PO TABS: 100.0000 mg | ORAL_TABLET | Freq: Two times a day (BID) | ORAL | 3 refills | Status: AC

## 2020-03-02 NOTE — Patient Instructions (Addendum)
INCREASE Carvedilol twice daily  INCREASE Hydralazine Twice daily  Your physician has requested that you have a carotid duplex. This test is an ultrasound of the carotid arteries in your neck. It looks at blood flow through these arteries that supply the brain with blood. Allow one hour for this exam. There are no restrictions or special instructions.  A referral for home health for Physical Therapy has been put in. They will contact you to schedule an appointment  Your physician has requested that you have an echocardiogram. Echocardiography is a painless test that uses sound waves to create images of your heart. It provides your doctor with information about the size and shape of your heart and how well your heart's chambers and valves are working. This procedure takes approximately one hour. There are no restrictions for this procedure.    Labs done today, your results will be available in MyChart, we will contact you for abnormal readings.   Please follow up with our office in 3 months   If you have any questions or concerns before your next appointment please send [place] a message through [place] or call our office at [phone].    TO LEAVE A MESSAGE FOR THE NURSE SELECT OPTION 2, PLEASE LEAVE A MESSAGE INCLUDING: . YOUR NAME . DATE OF BIRTH . CALL BACK NUMBER . REASON FOR CALL**this is important as we prioritize the call backs  [place] AS LONG AS YOU CALL BEFORE 4:00 PM  At the [facility], you and your health needs are our priority. As part of our continuing mission to provide you with exceptional heart care, we have created designated Provider Care Teams. These Care Teams include your primary Cardiologist (physician) and Advanced Practice Providers (APPs- Physician Assistants and Nurse Practitioners) who all work together to provide you with the care you need, when you need it.   You may see any of the following providers on your  designated Care Team at your next follow up: Marland Kitchen Dr Glori Bickers . Dr [name] . Darrick Grinder, NP . Lyda Jester, PA . Audry Riles, PharmD   Please be sure to bring in all your medications bottles to every appointment.    How to Prepare for Your Myoview Test (stress test):  1. Your remaining medications may be taken with water. 2. Nothing to eat or drink, except water, 4 hours prior to arrival time.  NO caffeine/decaffeinated products, or chocolate 12 hours prior to arrival. 3. Ladies, please do not wear dresses.  Skirts or pants are approprate, please wear a short sleeve shirt. 4. NO perfume, cologne or lotion 5. Wear comfortable walking shoes.  NO HEELS! 6. Total time is 3 to 4 hours; you may want to bring reading material for the waiting time. 7. Please report to [facility] for your test  What to expect after you arrive:  Once you arrive and check in for your appointment an IV will be started in your arm.  Then the Technoligist will inject a small amount of radioactive tracer.  There will be a 1 hour waiting period after this injection.  A series of pictures will be taken of your heart following this waiting period.  You will be prepped for the stress portion of the test.  During the stress portion of your test you will either walk on a treadmill or receive a small, safe amount of radioactive tracer injected in your IV.  After the stress portion,  there is a short rest period during which time your heart and blood pressure will be monitored.  After the short rest period the Technologist will begin your second set of pictures.  Your doctor will inform you of your test results within 7-10 business days.  In preparation for your appointment, medication and supplies will be purchased.  Appointment availability is limited, so if you need to cancel or reschedule please call the office at [phone] 24 hours in advance to avoid a cancellation fee of $100.00  [place], [place] TECHNOLOGIST.

## 2020-03-03 NOTE — Progress Notes (Signed)
Advanced Heart Failure Clinic Note   PCP: [name], MD HF Cardiology: Dr. Aundra Dubin  HPI: [facility]. Joc[facility] is a 69 y.o. female with h/o mitral regurgitation, chronic diastolic CHF, CAD s/p MI, long standing HTN, DM2, CKD IV-V, h/o Stroke. Anxiety, depression and GERD. Patient reported that she has had 2 MIs treated at [facility] in [place] several years ago.   Presented to [facility] on 03/12/2017 with acute hypoxic respiratory failure. ECHO showed normal LV function with severe MR. Diuresed with IV lasix over 20 pounds. She was being considered for MVR by Dr Roxy Manns but due to multiple complications she was not a candidate. Hill course was complicated by acute respiratory failure, acute renal failure, atrial fibrillation, retroperitoneal bleed, r hydronephrosis (perc tube) and E coli bacteremia. She was started on anticoagulation for atrial fibrillation but this was stopped with RP hematoma.  She converted to NSR on amiodarone but developed junctional rhythm so amiodarone was stopped. Creatinine peaked at 5.  She did not require dialysis and gradually renal function improved. Discharged to SNF without diuretics. Discharge weight was 124 pounds.   Admitted again 04/12/17-04/23/17 with respiratory failure from [facility] She had gained 16 pounds at SNF as she was not on diuretic.  CXR with bilateral infiltrates. Placed on vanc and zosyn. Pertinent admission labs included BNP > 3000, creatinine 3.75, K 4.1, WBC 16.1, procalcitonin 2.06, and troponin 0.04. Requiring 100% high flow oxygen. Blood CX - NGTD. Diuresing with high dose IV lasix.  Nephrology consulted, renal US with no evidence of hydronephrosis (pt had percutaneous nephrostomy drain the month prior due to this). Also placed on NTG drip for HTN and CP. She was seen by Dr. Roxy Manns again and felt still to not be a MVR candidate. Discharged on Lasix 160 mg BID. Discharge weight was 114 pounds.   TTE 03/12/17 LVEF 60-65%,  Grade 2 DD, Severe MR, Severe LAE, Mild RAE, Mild/Mod TR, PA peak pressure 60 mm hg, small/mod pericardial effusion.  TEE 03/17/17 LVEF 55-60%, Trivial TR, Severe MR, mild LAE, mild mod TR. No evidence of vegetation.  LHC/RHC in 1/19 showed 90% ostial LCx, 60% mid LAD.   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   She was admitted in 8/19 for Bioprosthetic MVR, Maze, LA appendage clip, PFO closure, and CABG with LIMA-LAD and SVG-LCx.  This was complicated by AKI and complete heart block.  St Jude CRT-P device placed.  Echo in 8/19 post-op showed EF 45-50%, stable bioprosthetic mitral valve.    Admitted 10/18-11/2/19 with volume overload and AKI with creatinine up to 5.9. Attempted diuresis with high dose lasix, but ultimately required HD for volume removal. VVS consulted and placed AVF on 10/25. She had significant swelling and pain to RUE. Improved over time. VVS planned for 4 week follow up with fistulogram. DC weight: 120 lbs. Discharged to SNF and arranged for HD MWF.  Echo in 10/19 showed EF 30-35%, severe LVH, s/p bioprosthetic mitral valve with mean gradient 5 mmHg, no mitral regurgitation, normal RV.   She returns for followup of CHF and CAD.  She is now living with her daughter.  Uses walker because of poor balance.  She is chronically short of breath walking more than about 50 feet.  She is not smoking. She has daily episodes of chest pain, says this pattern has been present "for a long time."  She will get 15-20 minutes  of left-sided, nonexertional chest tightness.  She is tolerating HD without lightheadedness.  Occasional palpitations. She is only taking Coreg and hydralazine once a day.   ECG (personally reviewed): NSR, BiV paced  Labs (10/18): K 3.8, creatinine 2.88 Labs (12/18): LDL 64, HDL 66 Labs (3/19): hgb 11.4 Labs (4/19): K 3.9, creatinine 3.97 Labs (7/19): K 4.2, creatinine 3.6 Labs  (9/19): K 4.8, creatinine 4.29  Review of systems complete and found to be negative unless listed in HPI.   PMH: 1. Chronic systolic CHF: Ischemic cardiomyopathy.  - TTE 03/12/17 LVEF 60-65%, Grade 2 DD, Severe MR, Severe LAE, Mild RAE, Mild/Mod TR, PA peak pressure 60 mm hg, small/mod pericardial effusion. - RHC (1/19): mean RA 2, PA 37/12 mean 26, mean PCWP 13, CI 2.98 - Echo (8/19) with EF 45-50%, bioprosthetic MV stable.  - St Jude CRT-P for complete heart block.   - Echo (10/19):  EF 30-35%, severe LVH, s/p bioprosthetic mitral valve with mean gradient 5 mmHg, no mitral regurgitation, normal RV 2. ESRD  3. Anxiety/panic attacks 4. DM 5. HTN 6. CAD: Patient reports prior MI treated at [facility].  No records available - LHC in 1/19 showed 90% ostial LCx, 60% mid LAD.  - CABG 8/19 with LIMA-LAD and SVG-LCx.  7. Retroperitoneal hematoma: With anticoagulation in 9/18.  8. Hydronephrosis due to RP hematoma: Resolved.  9. GERD 10. Hyperlipidemia 11. H/o CVA 12. 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  - TEE 03/17/17 LVEF 55-60%, Trivial TR, Severe MR, mild LAE, mild mod TR. No evidence of vegetation. 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  - Bioprosthetic MV replacement in 8/19.  13. Atrial fibrillation: Paroxysmal.  Noted in 9/18.  - Junctional rhythm on amiodarone, amiodarone stopped. - Maze 8/19.  14. PFO: s/p closure 8/19.  15. Complete heart block post-op 8/19: Now has St Jude CRT-P device.    Current Outpatient Medications  Medication Sig Dispense Refill  . acetaminophen (TYLENOL) 500 MG tablet Take 1,500-2,000 mg by mouth 2 (two) times daily as needed for moderate pain or headache.     . albuterol (PROVENTIL HFA;VENTOLIN HFA) 108 (90 Base) MCG/ACT inhaler Inhale 2 puffs into the lungs every 6 (six) hours as needed for  wheezing or shortness of breath.    . ALPRAZolam (XANAX) 0.25 MG tablet Take 1 tablet (0.25 mg total) by mouth at bedtime as needed for anxiety. 30 tablet 0  . amLODipine (NORVASC) 10 MG tablet Take 10 mg by mouth at bedtime.    Marland Kitchen atorvastatin (LIPITOR) 80 MG tablet Take 1 tablet (80 mg total) by mouth daily. (Patient taking differently: Take 80 mg by mouth at bedtime. ) 30 tablet 3  . AURYXIA 1 GM 210 MG(Fe) tablet Take 210 mg by mouth daily.     Marland Kitchen b complex vitamins tablet Take 1 tablet by mouth at bedtime.    . busPIRone (BUSPAR) 15 MG tablet Take 1 tablet (15 mg total) by mouth 2 (two) times daily. 60 tablet 0  . calcitRIOL (ROCALTROL) 0.5 MCG capsule Take 1 capsule (0.5 mcg total) by mouth every Monday, Wednesday, and Friday with hemodialysis.    Marland Kitchen carvedilol (COREG) 25 MG tablet Take 1 tablet (25 mg total) by mouth 2 (two) times daily with a meal. 60 tablet 3  . cholecalciferol (VITAMIN D) 1000 units tablet Take 1,000 Units by mouth at bedtime.     Marland Kitchen  dextromethorphan-guaiFENesin (MUCINEX DM) 30-600 MG 12hr tablet Take 1 tablet by mouth 2 (two) times daily as needed for cough. 60 tablet 0  . diphenhydrAMINE (BENADRYL) 25 MG tablet Take 1 tablet (25 mg total) by mouth daily as needed for itching. 30 tablet 0  . doxazosin (CARDURA) 4 MG tablet Take 4-8 mg by mouth See admin instructions. Take one tablet (4 mg) by mouth every morning and two tablets (8 mg) at night    . gabapentin (NEURONTIN) 300 MG capsule Take 1 capsule (300 mg total) by mouth 3 (three) times a week. After dialysis on M/W/F 12 capsule 1  . gabapentin (NEURONTIN) 800 MG tablet Take 800 mg by mouth at bedtime.     . heparin 1000 UNIT/ML injection 1 mL (1,000 Units total) by Intracatheter route every Monday, Wednesday, and Friday with hemodialysis. 1 mL   . hydrALAZINE (APRESOLINE) 100 MG tablet Take 1 tablet (100 mg total) by mouth 2 (two) times daily. 60 tablet 3  . isosorbide mononitrate (IMDUR) 120 MG 24 hr tablet Take 1 tablet  (120 mg total) by mouth at bedtime. 30 tablet 0  . levothyroxine (SYNTHROID) 100 MCG tablet Take 100 mcg by mouth daily before breakfast.    . lidocaine-prilocaine (EMLA) cream Apply 1 application topically daily as needed (port).     . meclizine (ANTIVERT) 25 MG tablet Take 0.5-1 tablets (12.5-25 mg total) by mouth 3 (three) times daily as needed for dizziness. 10 tablet 0  . montelukast (SINGULAIR) 10 MG tablet Take 1 tablet (10 mg total) by mouth daily. (Patient taking differently: Take 10 mg by mouth at bedtime. ) 30 tablet 0  . multivitamin (RENA-VIT) TABS tablet Take 1 tablet by mouth at bedtime.  0  . nitroGLYCERIN (NITROSTAT) 0.4 MG SL tablet Place 0.4 mg under the tongue every 5 (five) minutes as needed for chest pain.    . Nutritional Supplements (FEEDING SUPPLEMENT, NEPRO CARB STEADY,) LIQD Take 237 mLs by mouth 3 (three) times daily between meals.  0  . oxyCODONE-acetaminophen (PERCOCET) 10-325 MG tablet Take 1 tablet by mouth every 6 (six) hours as needed for pain. 10 tablet 0  . pantoprazole (PROTONIX) 40 MG tablet Take 1 tablet (40 mg total) by mouth 2 (two) times daily before a meal. (Patient taking differently: Take 40 mg by mouth daily. ) 60 tablet 0  . polyethylene glycol (MIRALAX / GLYCOLAX) packet Take 17 g by mouth daily. (Patient taking differently: Take 17 g by mouth daily as needed for moderate constipation. ) 14 each 0  . traZODone (DESYREL) 100 MG tablet Take 100 mg by mouth at bedtime.    Marland Kitchen warfarin (COUMADIN) 3 MG tablet Take 1 tablet (3 mg total) by mouth daily at 6 PM. (Patient taking differently: Take 3-6 mg by mouth See admin instructions. 6 mg on Sunday, and 3 mg all other days) 30 tablet 0   No current facility-administered medications for this encounter.    Allergies  Allergen Reactions  . Ace Inhibitors Anaphylaxis and Swelling  . Motrin Ib [Ibuprofen] Anaphylaxis      Social History   Socioeconomic History  . Marital status: Divorced    Spouse name:  Not on file  . Number of children: Not on file  . Years of education: Not on file  . Highest education level: Not on file  Occupational History  . Not on file  Tobacco Use  . Smoking status: Former Smoker    Types: Cigarettes    Quit date: 1980  Years since quitting: 41.7  . Smokeless tobacco: Never Used  . Tobacco comment: short time  Vaping Use  . Vaping Use: Never used  Substance and Sexual Activity  . Alcohol use: No  . Drug use: Not Currently    Comment: marijuana in the past  . Sexual activity: Not Currently  Other Topics Concern  . Not on file  Social History Narrative  . Not on file   Social Determinants of Health   Financial Resource Strain:   . Difficulty of Paying Living Expenses: Not on file  Food Insecurity:   . Worried About Charity fundraiser in the Last Year: Not on file  . Ran Out of Food in the Last Year: Not on file  Transportation Needs:   . Lack of Transportation (Medical): Not on file  . Lack of Transportation (Non-Medical): Not on file  Physical Activity:   . Days of Exercise per Week: Not on file  . Minutes of Exercise per Session: Not on file  Stress:   . Feeling of Stress : Not on file  Social Connections:   . Frequency of Communication with Friends and Family: Not on file  . Frequency of Social Gatherings with Friends and Family: Not on file  . Attends Religious Services: Not on file  . Active Member of Clubs or Organizations: Not on file  . Attends Archivist Meetings: Not on file  . Marital Status: Not on file  Intimate Partner Violence:   . Fear of Current or Ex-Partner: Not on file  . Emotionally Abused: Not on file  . Physically Abused: Not on file  . Sexually Abused: Not on file      Family History  Problem Relation Age of Onset  . Hypertension Mother     Vitals:   03/02/20 0952  BP: (!) 158/72  Pulse: 60  SpO2: 97%  Weight: 56.2 kg (124 lb)  Height: 5\' 9"  (1.753 m)   Wt Readings from Last 3 Encounters:    03/02/20 56.2 kg (124 lb)  01/12/20 54.9 kg (121 lb)  12/27/19 56 kg (123 lb 8 oz)   PHYSICAL EXAM: General: NAD Neck: No JVD, no thyromegaly or thyroid nodule.  Lungs: Clear to auscultation bilaterally with normal respiratory effort. CV: Nondisplaced PMI.  Heart regular S1/S2, no S3/S4, 2/6 early SEM RUSB.  No peripheral edema.  Bilateral carotid bruit.  Normal pedal pulses.  Abdomen: Soft, nontender, no hepatosplenomegaly, no distention.  Skin: Intact without lesions or rashes.  Neurologic: Alert and oriented x 3.  Psych: Normal affect. Extremities: No clubbing or cyanosis.  HEENT: Normal.   ASSESSMENT & PLAN: 1. Chronic systolic CHF: Suspect ischemic cardiomyopathy.  Has St Jude CRT-P device due to post-op complete heart block.  Echo 04/18/18 with EF 30-35%, grade 2 DD, RV normal. NYHA class III symptoms chronically.  Volume status looks ok.  - Volume managed by HD - Increase Coreg to bid from qd.  - Increase hydralazine to 100 bid (does not think she can remember tid).  Continue imdur 120 mg daily - She is due for a repeat echo, I will arrange.  2. ESRD: Now on HD MWF.  3. HTN: Increasing Coreg and hydralazine to bid from qd.  4.  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  Had bioprosthetic MVR in 8/19.  Post-op echo in 8/19 showed stable bioprosthetic mitral valve.  5. Atrial fibrillation: Paroxysmal.  Now s/p Maze in 8/19. NSR today.  - Continue warfarin. CBC today.  6. CAD:  LHC in 1/19 showed severe ostial LCx disease and moderate LAD disease. She had LIMA-LAD and SVG-OM in 8/19. She has daily atypical chest pain.  - Continue atorvastatin 80 mg daily. Check lipids.  - She is on warfarin so no ASA use.  - With atypical chest pain, I will arrange for Lexiscan Cardiolite for risk stratification.  7. Carotid bruits: I will arrange for carotid dopplers.   Followup in 3 months.   [name], MD 03/03/20

## 2020-03-06 ENCOUNTER — Telehealth (HOSPITAL_COMMUNITY): Payer: Self-pay | Admitting: *Deleted

## 2020-03-06 NOTE — Telephone Encounter (Signed)
Called patient to review instructions for Nuclear test, patient states she wants to reschedule.  [facility]

## 2020-03-07 ENCOUNTER — Encounter (HOSPITAL_COMMUNITY): Payer: Medicare Other

## 2020-03-12 ENCOUNTER — Encounter: Payer: Self-pay | Admitting: Neurology

## 2020-03-12 ENCOUNTER — Ambulatory Visit: Payer: Medicare Other | Admitting: Neurology

## 2020-03-12 ENCOUNTER — Telehealth: Payer: Self-pay | Admitting: Neurology

## 2020-03-12 NOTE — Telephone Encounter (Signed)
This is the second new patient no-show for this patient, she will be discharged from our practice.

## 2020-03-13 ENCOUNTER — Encounter: Payer: Self-pay | Admitting: Neurology

## 2020-03-13 ENCOUNTER — Ambulatory Visit (HOSPITAL_COMMUNITY): Payer: Medicare Other

## 2020-03-14 ENCOUNTER — Encounter (HOSPITAL_COMMUNITY): Payer: Medicare Other

## 2020-03-20 ENCOUNTER — Ambulatory Visit (HOSPITAL_COMMUNITY): Admission: RE | Admit: 2020-03-20 | Payer: Medicare Other | Source: Ambulatory Visit

## 2020-03-21 ENCOUNTER — Telehealth (HOSPITAL_COMMUNITY): Payer: Self-pay | Admitting: *Deleted

## 2020-03-21 NOTE — Telephone Encounter (Signed)
Left message on voicemail in reference to upcoming appointment scheduled for 03/22/20. Phone number given for a call back so details instructions can be given. [facility]

## 2020-03-22 ENCOUNTER — Other Ambulatory Visit: Payer: Self-pay

## 2020-03-22 ENCOUNTER — Ambulatory Visit (HOSPITAL_COMMUNITY): Payer: Medicare Other | Attending: Internal Medicine

## 2020-03-22 DIAGNOSIS — R079 Chest pain, unspecified: Secondary | ICD-10-CM | POA: Diagnosis present

## 2020-03-22 LAB — MYOCARDIAL PERFUSION IMAGING
LV dias vol: 132 mL (ref 46–106)
LV sys vol: 55 mL
Peak HR: 82 {beats}/min
Rest HR: 67 {beats}/min
SDS: 0
SRS: 5
SSS: 5
TID: 0.98

## 2020-03-22 MED ORDER — TECHNETIUM TC 99M TETROFOSMIN IV KIT
30.7000 | PACK | Freq: Once | INTRAVENOUS | Status: AC | PRN
  Administered 2020-03-22: 30.7 via INTRAVENOUS
  Filled 2020-03-22: qty 31

## 2020-03-22 MED ORDER — TECHNETIUM TC 99M TETROFOSMIN IV KIT
10.5000 | PACK | Freq: Once | INTRAVENOUS | Status: AC | PRN
  Administered 2020-03-22: 10.5 via INTRAVENOUS
  Filled 2020-03-22: qty 11

## 2020-03-22 MED ORDER — REGADENOSON 0.4 MG/5ML IV SOLN
0.4000 mg | Freq: Once | INTRAVENOUS | Status: AC
Start: 2020-03-22 — End: 2020-03-22
  Administered 2020-03-22: 0.4 mg via INTRAVENOUS

## 2020-03-22 NOTE — Addendum Note (Signed)
Encounter addended by: Harvie Junior, CMA on: 03/22/2020 9:42 AM  Actions taken: Charge Capture section accepted

## 2020-03-23 ENCOUNTER — Ambulatory Visit (INDEPENDENT_AMBULATORY_CARE_PROVIDER_SITE_OTHER): Payer: Medicare Other

## 2020-03-23 DIAGNOSIS — I5042 Chronic combined systolic (congestive) and diastolic (congestive) heart failure: Secondary | ICD-10-CM

## 2020-03-23 LAB — ECHOCARDIOGRAM COMPLETE
Area-P 1/2: 3.99 cm2
S' Lateral: 2.8 cm

## 2020-03-23 NOTE — Progress Notes (Deleted)
Complete echocardiogram performed.  Jimmy Manisha Cancel RDCS, RVT  

## 2020-03-23 NOTE — Progress Notes (Signed)
Complete echocardiogram performed.  Jimmy Saleah Rishel RDCS, RVT  

## 2020-03-27 ENCOUNTER — Ambulatory Visit (HOSPITAL_COMMUNITY): Payer: Medicare Other

## 2020-04-02 ENCOUNTER — Ambulatory Visit (HOSPITAL_COMMUNITY)
Admission: RE | Admit: 2020-04-02 | Discharge: 2020-04-02 | Disposition: A | Discharge status: Home / Self Care | Payer: Medicare Other | Source: Ambulatory Visit | Attending: Cardiology | Admitting: Cardiology

## 2020-04-02 ENCOUNTER — Other Ambulatory Visit: Payer: Self-pay

## 2020-04-02 DIAGNOSIS — I5042 Chronic combined systolic (congestive) and diastolic (congestive) heart failure: Secondary | ICD-10-CM | POA: Diagnosis not present

## 2020-04-02 NOTE — Progress Notes (Signed)
Per Dr Aundra Dubin pt needs:  --PYP Scan --Myeloma Panel --f/u in 6 weeks with APP  Orders placed and message sent to schedulers to arrange appts.

## 2020-04-03 ENCOUNTER — Encounter: Payer: Self-pay | Admitting: Physician Assistant

## 2020-04-03 ENCOUNTER — Ambulatory Visit (INDEPENDENT_AMBULATORY_CARE_PROVIDER_SITE_OTHER): Payer: Medicare Other | Admitting: Physician Assistant

## 2020-04-03 ENCOUNTER — Ambulatory Visit (HOSPITAL_COMMUNITY)
Admission: RE | Admit: 2020-04-03 | Payer: Medicare Other | Source: Ambulatory Visit | Attending: Cardiology | Admitting: Cardiology

## 2020-04-03 ENCOUNTER — Other Ambulatory Visit: Payer: Self-pay | Admitting: *Deleted

## 2020-04-03 VITALS — BP 139/69 | HR 60 | Temp 98.3°F | Resp 20 | Ht 69.0 in | Wt 123.8 lb

## 2020-04-03 DIAGNOSIS — N186 End stage renal disease: Secondary | ICD-10-CM | POA: Diagnosis not present

## 2020-04-03 DIAGNOSIS — Z992 Dependence on renal dialysis: Secondary | ICD-10-CM | POA: Diagnosis not present

## 2020-04-03 DIAGNOSIS — T82898A Other specified complication of vascular prosthetic devices, implants and grafts, initial encounter: Secondary | ICD-10-CM | POA: Diagnosis not present

## 2020-04-03 NOTE — Progress Notes (Signed)
Established Dialysis Access   History of Present Illness   [facility] is a 69 y.o. ([birth year]) female who presents for for follow up after fistulogram. She underwent right arm fistulogram including central venogram and right peripheral venoplasty of basilic vein by Dr. Carlis Abbott on 01/12/20. This was performed due to malfunctioning Basilic vein fistula. Her fistula has been functioning intermittently. She has had issues with them cannulating her fistula at dialysis and continued bleeding after dialysis. There have been several days where they had to go back to using her left thigh catheter to dialyze her. She also reports that yesterday she bled for 1.5 hours after she left dialysis and had to go back to get them to hold pressure on it till it stopped bleeding. She does report some coldness and tingling in her right hand but no significant steal symptoms.   She more so presented today due to left hand pain. She initially had a Left upper extremity graft placed in October of 2019 by Dr. Carlis Abbott. This unfortunately had to be ligated by Dr. Oneida Alar in September of 2020 due to ischemic steal symptoms. She then subsequently had to have removal of the left upper extremity graft due to infection in October of 2020 with vein patch repair of her left brachial artery by Dr. Donnetta Hutching.  She began having increased left hand pain over the past 2 weeks. She gets sharp shooting pains down her arm into her hand. These pains wake her up from sleep. She also has numbness and coldness in her left hand. She says a lot of this has been present since her graft was initially placed however the pain has become significantly worse. She has been trying to take Tylenol without relief.   She dialyzes on MWF at the [facility] in [place]. She does still have a temporary thigh catheter that is intermittently still used.   The patient's PMH, PSH, SH, and FamHx were reviewed and are unchanged from her last visit   Current Outpatient  Medications  Medication Sig Dispense Refill  . acetaminophen (TYLENOL) 500 MG tablet Take 1,500-2,000 mg by mouth 2 (two) times daily as needed for moderate pain or headache.     . albuterol (PROVENTIL HFA;VENTOLIN HFA) 108 (90 Base) MCG/ACT inhaler Inhale 2 puffs into the lungs every 6 (six) hours as needed for wheezing or shortness of breath.    . ALPRAZolam (XANAX) 0.25 MG tablet Take 1 tablet (0.25 mg total) by mouth at bedtime as needed for anxiety. 30 tablet 0  . amLODipine (NORVASC) 10 MG tablet Take 10 mg by mouth at bedtime.    Marland Kitchen atorvastatin (LIPITOR) 80 MG tablet Take 1 tablet (80 mg total) by mouth daily. (Patient taking differently: Take 80 mg by mouth at bedtime. ) 30 tablet 3  . AURYXIA 1 GM 210 MG(Fe) tablet Take 210 mg by mouth daily.     Marland Kitchen b complex vitamins tablet Take 1 tablet by mouth at bedtime.    . busPIRone (BUSPAR) 15 MG tablet Take 1 tablet (15 mg total) by mouth 2 (two) times daily. 60 tablet 0  . calcitRIOL (ROCALTROL) 0.5 MCG capsule Take 1 capsule (0.5 mcg total) by mouth every Monday, Wednesday, and Friday with hemodialysis.    Marland Kitchen carvedilol (COREG) 25 MG tablet Take 1 tablet (25 mg total) by mouth 2 (two) times daily with a meal. 60 tablet 3  . cholecalciferol (VITAMIN D) 1000 units tablet Take 1,000 Units by mouth at bedtime.     Marland Kitchen  dextromethorphan-guaiFENesin (MUCINEX DM) 30-600 MG 12hr tablet Take 1 tablet by mouth 2 (two) times daily as needed for cough. 60 tablet 0  . diphenhydrAMINE (BENADRYL) 25 MG tablet Take 1 tablet (25 mg total) by mouth daily as needed for itching. 30 tablet 0  . doxazosin (CARDURA) 4 MG tablet Take 4-8 mg by mouth See admin instructions. Take one tablet (4 mg) by mouth every morning and two tablets (8 mg) at night    . gabapentin (NEURONTIN) 300 MG capsule Take 1 capsule (300 mg total) by mouth 3 (three) times a week. After dialysis on M/W/F 12 capsule 1  . gabapentin (NEURONTIN) 800 MG tablet Take 800 mg by mouth at bedtime.     .  heparin 1000 UNIT/ML injection 1 mL (1,000 Units total) by Intracatheter route every Monday, Wednesday, and Friday with hemodialysis. 1 mL   . hydrALAZINE (APRESOLINE) 100 MG tablet Take 1 tablet (100 mg total) by mouth 2 (two) times daily. 60 tablet 3  . iron sucrose in sodium chloride 0.9 % 100 mL Iron Sucrose (Venofer)    . isosorbide mononitrate (IMDUR) 120 MG 24 hr tablet Take 1 tablet (120 mg total) by mouth at bedtime. 30 tablet 0  . levothyroxine (SYNTHROID) 100 MCG tablet Take 100 mcg by mouth daily before breakfast.    . lidocaine-prilocaine (EMLA) cream Apply 1 application topically daily as needed (port).     . meclizine (ANTIVERT) 25 MG tablet Take 0.5-1 tablets (12.5-25 mg total) by mouth 3 (three) times daily as needed for dizziness. 10 tablet 0  . Methoxy PEG-Epoetin Beta (MIRCERA IJ) Mircera    . montelukast (SINGULAIR) 10 MG tablet Take 1 tablet (10 mg total) by mouth daily. (Patient taking differently: Take 10 mg by mouth at bedtime. ) 30 tablet 0  . multivitamin (RENA-VIT) TABS tablet Take 1 tablet by mouth at bedtime.  0  . nitroGLYCERIN (NITROSTAT) 0.4 MG SL tablet Place 0.4 mg under the tongue every 5 (five) minutes as needed for chest pain.    . Nutritional Supplements (FEEDING SUPPLEMENT, NEPRO CARB STEADY,) LIQD Take 237 mLs by mouth 3 (three) times daily between meals.  0  . oxyCODONE-acetaminophen (PERCOCET) 10-325 MG tablet Take 1 tablet by mouth every 6 (six) hours as needed for pain. 10 tablet 0  . pantoprazole (PROTONIX) 40 MG tablet Take 1 tablet (40 mg total) by mouth 2 (two) times daily before a meal. (Patient taking differently: Take 40 mg by mouth daily. ) 60 tablet 0  . polyethylene glycol (MIRALAX / GLYCOLAX) packet Take 17 g by mouth daily. (Patient taking differently: Take 17 g by mouth daily as needed for moderate constipation. ) 14 each 0  . tiZANidine (ZANAFLEX) 4 MG tablet Take 4 mg by mouth 3 (three) times daily as needed.    . traZODone (DESYREL) 100 MG  tablet Take 100 mg by mouth at bedtime.    Marland Kitchen warfarin (COUMADIN) 3 MG tablet Take 1 tablet (3 mg total) by mouth daily at 6 PM. (Patient taking differently: Take 3-6 mg by mouth See admin instructions. 6 mg on Sunday, and 3 mg all other days) 30 tablet 0   No current facility-administered medications for this visit.     Physical Examination   Vitals:   04/03/20 1407  BP: 139/69  Pulse: 60  Resp: 20  Temp: 98.3 F (36.8 C)  TempSrc: Temporal  SpO2: 97%  Weight: 123 lb 12.8 oz (56.2 kg)  Height: 5\' 9"  (1.753 m)  Body mass index is 18.28 kg/m.  General Thin, well appearing, Not in any acute distress  Pulmonary Clear to auscultation bilaterally, non labored  Cardiac Regular rate and rhythm, no mumurs  Vascular Vessel Right Left  Radial Palpable Palpable  Brachial Palpable Palpable  Ulnar Faintly palpable Faintly palpable    Musculo- skeletal M/S 5/5 throughout upper and lower extremities, Extremities without ischemic changes  left upper arm with easily palpable pulses, left arm warm, no edema. Doppler Radial, ulnar and palmar signals. Right arm fistula with good thrill and bruit  Neurologic A&O; CN grossly intact    Medical Decision Making   NITARA SZCZERBA is a 69 y.o. female who presents for follow up of malfunctioning right AV fistula with recurrent bleeding and trouble with cannulation. She does still have a right thigh catheter to be used if there are issues. I advised her to use the catheter for now until she returns for follow up with Dr. Carlis Abbott. She will likely need scheduled for repeat Fistulogram. Clinically her left upper extremity is well perfused. She has easily palpable pulses and good doppler signals. Her symptoms may be neuropathic in nature but I will bring her back for an arterial duplex  She will follow up in 1-2 weeks with left upper extremity arterial duplex and to see Dr. Tracey Harries, PA-C [facility] Office:  [phone]  Clinic MD: Dr. Carlis Abbott

## 2020-04-03 NOTE — Progress Notes (Signed)
Heart Failure TeleHealth Note  Due to national recommendations of social distancing due to [place] 19, Audio/video telehealth visit is felt to be most appropriate for this patient at this time.  See MyChart message from today for patient consent regarding telehealth for [name].  Date:  04/03/2020   ID:  Jocelyn Hill, DOB February 13, 1951, MRN [id number]  Location: Home  Provider location: [place] [facility] Type of Visit: Established patient   PCP:  Jocelyn Rings, MD  Cardiologist:  [name], MD  Chief Complaint: Shortness of breath.   History of Present Illness: Jocelyn Hill is a 69 y.o. female with a history of who presents via audio/video conferencing for a telehealth visit today.     she denies symptoms worrisome for COVID 19.   Patient has h/o mitral regurgitation, chronic diastolic CHF, CAD s/p MI, long standing HTN, DM2, CKD IV-V, h/o Stroke. Anxiety, depression and GERD. Patient reported that she has had 2 MIs treated at [facility] in [place] several years ago.   Presented to [facility] on 03/12/2017 with acute hypoxic respiratory failure. ECHO showed normal LV function with severe MR. Diuresed with IV lasix over 20 pounds. She was being considered for MVR by Dr Roxy Manns but due to multiple complications she was not a candidate. Hospital course was complicated by acute respiratory failure, acute renal failure, atrial fibrillation, retroperitoneal bleed, r hydronephrosis (perc tube) and E coli bacteremia. She was started on anticoagulation for atrial fibrillation but this was stopped with RP hematoma.  She converted to NSR on amiodarone but developed junctional rhythm so amiodarone was stopped. Creatinine peaked at 5.  She did not require dialysis and gradually renal function improved. Discharged to SNF without diuretics. Discharge weight was 124 pounds.   Admitted again 04/12/17-04/23/17 with respiratory failure from [facility]. She  had gained 16 pounds at SNF as she was not on diuretic.  CXR with bilateral infiltrates. Placed on vanc and zosyn. Pertinent admission labs included BNP > 3000, creatinine 3.75, K 4.1, WBC 16.1, procalcitonin 2.06, and troponin 0.04. Requiring 100% high flow oxygen. Blood CX - NGTD. Diuresing with high dose IV lasix.  Nephrology consulted, renal US with no evidence of hydronephrosis (pt had percutaneous nephrostomy drain the month prior due to this). Also placed on NTG drip for HTN and CP. She was seen by Dr. Roxy Manns again and felt still to not be a MVR candidate. Discharged on Lasix 160 mg BID. Discharge weight was 114 pounds.   TTE 03/12/17 LVEF 60-65%, Grade 2 DD, Severe MR, Severe LAE, Mild RAE, Mild/Mod TR, PA peak pressure 60 mm hg, small/mod pericardial effusion.  TEE 03/17/17 LVEF 55-60%, Trivial TR, Severe MR, mild LAE, mild mod TR. No evidence of vegetation.  LHC/RHC in 1/19 showed 90% ostial LCx, 60% mid LAD.   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   She was admitted in 8/19 for Bioprosthetic MVR, Maze, LA appendage clip, PFO closure, and CABG with LIMA-LAD and SVG-LCx.  This was complicated by AKI and complete heart block.  St Jude CRT-P device placed.  Echo in 8/19 post-op showed EF 45-50%, stable bioprosthetic mitral valve.    Admitted 10/18-11/2/19 with volume overload and AKI with creatinine up to 5.9. Attempted diuresis with high dose lasix, but ultimately required HD for volume removal. VVS consulted and placed AVF on 10/25. She had significant swelling and pain to RUE. Improved  over time. VVS planned for 4 week follow up with fistulogram. DC weight: 120 lbs. Discharged to SNF and arranged for HD MWF.  Echo in 10/19 showed EF 30-35%, severe LVH, s/p bioprosthetic mitral valve with mean gradient 5 mmHg, no mitral regurgitation, normal RV.   Cardiolite in 9/21 showed EF 58%, no  ischemia/infarction. 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   Patient continues to have atypical/nonexertional chest pain. She tends to have some dyspnea the day before HD that is better after HD.  Occasional orthostatic-type lightheadedness but no falls.  No BRBPR/melena.  No trouble with hypotension at HD.    Labs (10/18): K 3.8, creatinine 2.88 Labs (12/18): LDL 64, HDL 66 Labs (3/19): hgb 11.4 Labs (4/19): K 3.9, creatinine 3.97 Labs (7/19): K 4.2, creatinine 3.6 Labs (9/19): K 4.8, creatinine 4.29 Labs (9/21): LDL 30  Review of systems complete and found to be negative unless listed in HPI.   PMH: 1. Chronic systolic CHF: Ischemic cardiomyopathy.  - TTE 03/12/17 LVEF 60-65%, Grade 2 DD, Severe MR, Severe LAE, Mild RAE, Mild/Mod TR, PA peak pressure 60 mm hg, small/mod pericardial effusion. - RHC (1/19): mean RA 2, PA 37/12 mean 26, mean PCWP 13, CI 2.98 - Echo (8/19) with EF 45-50%, bioprosthetic MV stable.  - St Jude CRT-P for complete heart block.   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  2. ESRD  3. Anxiety/panic attacks 4. DM 5. HTN 6. CAD: Patient reports prior MI treated at [facility].  No records available - LHC in 1/19 showed 90% ostial LCx, 60% mid LAD.  - CABG 8/19 with LIMA-LAD and SVG-LCx.  - Cardiolite (9/21) with EF 58%, no ischemia/infarction.  7. Retroperitoneal hematoma: With anticoagulation in 9/18.  8. Hydronephrosis due to RP hematoma: Resolved.  9. GERD 10. Hyperlipidemia 11. H/o CVA 12. 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  - TEE 03/17/17 LVEF 55-60%, Trivial TR, Severe MR, mild LAE, mild mod TR. No evidence of vegetation. 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  - Bioprosthetic MV replacement in 8/19.  13. Atrial fibrillation: Paroxysmal.  Noted in 9/18.  - Junctional rhythm on amiodarone, amiodarone stopped. - Maze 8/19.  14. PFO: s/p closure 8/19.  15. Complete heart block post-op 8/19: Now has St Jude CRT-P device.    Current Outpatient Medications  Medication Sig Dispense Refill  . acetaminophen (TYLENOL) 500 MG tablet Take 1,500-2,000 mg by mouth 2 (two) times daily as needed for moderate pain or headache.     . albuterol (PROVENTIL HFA;VENTOLIN HFA) 108 (90 Base) MCG/ACT inhaler Inhale 2 puffs into the lungs every 6 (six) hours as needed for wheezing or shortness of breath.    . ALPRAZolam (XANAX) 0.25 MG tablet Take 1 tablet (0.25 mg total) by mouth at bedtime as needed for anxiety. 30 tablet 0  . amLODipine (NORVASC) 10 MG tablet Take 10 mg by mouth at bedtime.    Marland Kitchen atorvastatin (LIPITOR) 80 MG tablet Take 1 tablet (80 mg total) by mouth daily. (Patient taking differently: Take 80 mg by mouth at bedtime. ) 30 tablet 3  . AURYXIA 1 GM 210 MG(Fe) tablet  Take 210 mg by mouth daily.     Marland Kitchen b complex vitamins tablet Take 1 tablet by mouth at bedtime.    . busPIRone (BUSPAR) 15 MG tablet Take 1 tablet (15 mg total) by mouth 2 (two) times daily. 60 tablet 0  . calcitRIOL (ROCALTROL) 0.5 MCG capsule Take 1 capsule (0.5 mcg total) by mouth every Monday, Wednesday, and Friday with hemodialysis.    Marland Kitchen carvedilol (COREG) 25 MG tablet Take 1 tablet (25 mg total) by mouth 2 (two) times daily with a meal. 60 tablet 3  . cholecalciferol (VITAMIN D) 1000 units tablet Take 1,000 Units by mouth at bedtime.     Marland Kitchen dextromethorphan-guaiFENesin (MUCINEX DM) 30-600 MG 12hr tablet Take 1 tablet by mouth 2 (two) times daily as needed for cough. 60 tablet 0  . diphenhydrAMINE (BENADRYL) 25 MG tablet Take 1 tablet (25 mg total)  by mouth daily as needed for itching. 30 tablet 0  . doxazosin (CARDURA) 4 MG tablet Take 4-8 mg by mouth See admin instructions. Take one tablet (4 mg) by mouth every morning and two tablets (8 mg) at night    . gabapentin (NEURONTIN) 300 MG capsule Take 1 capsule (300 mg total) by mouth 3 (three) times a week. After dialysis on M/W/F 12 capsule 1  . gabapentin (NEURONTIN) 800 MG tablet Take 800 mg by mouth at bedtime.     . heparin 1000 UNIT/ML injection 1 mL (1,000 Units total) by Intracatheter route every Monday, Wednesday, and Friday with hemodialysis. 1 mL   . hydrALAZINE (APRESOLINE) 100 MG tablet Take 1 tablet (100 mg total) by mouth 2 (two) times daily. 60 tablet 3  . iron sucrose in sodium chloride 0.9 % 100 mL Iron Sucrose (Venofer)    . isosorbide mononitrate (IMDUR) 120 MG 24 hr tablet Take 1 tablet (120 mg total) by mouth at bedtime. 30 tablet 0  . levothyroxine (SYNTHROID) 100 MCG tablet Take 100 mcg by mouth daily before breakfast.    . lidocaine-prilocaine (EMLA) cream Apply 1 application topically daily as needed (port).     . meclizine (ANTIVERT) 25 MG tablet Take 0.5-1 tablets (12.5-25 mg total) by mouth 3 (three) times daily as needed for dizziness. 10 tablet 0  . Methoxy PEG-Epoetin Beta (MIRCERA IJ) Mircera    . montelukast (SINGULAIR) 10 MG tablet Take 1 tablet (10 mg total) by mouth daily. (Patient taking differently: Take 10 mg by mouth at bedtime. ) 30 tablet 0  . multivitamin (RENA-VIT) TABS tablet Take 1 tablet by mouth at bedtime.  0  . nitroGLYCERIN (NITROSTAT) 0.4 MG SL tablet Place 0.4 mg under the tongue every 5 (five) minutes as needed for chest pain.    . Nutritional Supplements (FEEDING SUPPLEMENT, NEPRO CARB STEADY,) LIQD Take 237 mLs by mouth 3 (three) times daily between meals.  0  . oxyCODONE-acetaminophen (PERCOCET) 10-325 MG tablet Take 1 tablet by mouth every 6 (six) hours as needed for pain. 10 tablet 0  . pantoprazole (PROTONIX) 40 MG tablet Take 1  tablet (40 mg total) by mouth 2 (two) times daily before a meal. (Patient taking differently: Take 40 mg by mouth daily. ) 60 tablet 0  . polyethylene glycol (MIRALAX / GLYCOLAX) packet Take 17 g by mouth daily. (Patient taking differently: Take 17 g by mouth daily as needed for moderate constipation. ) 14 each 0  . tiZANidine (ZANAFLEX) 4 MG tablet Take 4 mg by mouth 3 (three) times daily as needed.    . traZODone (  DESYREL) 100 MG tablet Take 100 mg by mouth at bedtime.    Marland Kitchen warfarin (COUMADIN) 3 MG tablet Take 1 tablet (3 mg total) by mouth daily at 6 PM. (Patient taking differently: Take 3-6 mg by mouth See admin instructions. 6 mg on Sunday, and 3 mg all other days) 30 tablet 0   No current facility-administered medications for this encounter.    Allergies  Allergen Reactions  . Ace Inhibitors Anaphylaxis and Swelling  . Motrin Ib [Ibuprofen] Anaphylaxis      Social History   Socioeconomic History  . Marital status: Divorced    Spouse name: Not on file  . Number of children: Not on file  . Years of education: Not on file  . Highest education level: Not on file  Occupational History  . Not on file  Tobacco Use  . Smoking status: Former Smoker    Types: Cigarettes    Quit date: 1980    Years since quitting: 41.7  . Smokeless tobacco: Never Used  . Tobacco comment: short time  Vaping Use  . Vaping Use: Never used  Substance and Sexual Activity  . Alcohol use: No  . Drug use: Not Currently    Comment: marijuana in the past  . Sexual activity: Not Currently  Other Topics Concern  . Not on file  Social History Narrative  . Not on file   Social Determinants of Health   Financial Resource Strain:   . Difficulty of Paying Living Expenses: Not on file  Food Insecurity:   . Worried About Charity fundraiser in the Last Year: Not on file  . Ran Out of Food in the Last Year: Not on file  Transportation Needs:   . Lack of Transportation (Medical): Not on file  . Lack of  Transportation (Non-Medical): Not on file  Physical Activity:   . Days of Exercise per Week: Not on file  . Minutes of Exercise per Session: Not on file  Stress:   . Feeling of Stress : Not on file  Social Connections:   . Frequency of Communication with Friends and Family: Not on file  . Frequency of Social Gatherings with Friends and Family: Not on file  . Attends Religious Services: Not on file  . Active Member of Clubs or Organizations: Not on file  . Attends Archivist Meetings: Not on file  . Marital Status: Not on file  Intimate Partner Violence:   . Fear of Current or Ex-Partner: Not on file  . Emotionally Abused: Not on file  . Physically Abused: Not on file  . Sexually Abused: Not on file      Family History  Problem Relation Age of Onset  . Hypertension Mother     There were no vitals filed for this visit. Wt Readings from Last 3 Encounters:  04/03/20 56.2 kg (123 lb 12.8 oz)  03/22/20 56.2 kg (124 lb)  03/02/20 56.2 kg (124 lb)   Exam:  (Video/Tele Health Call; Exam is subjective and or/visual.) General:  Speaks in full sentences. No resp difficulty. Lungs: Normal respiratory effort with conversation.  Abdomen: Non-distended per patient report Extremities: Pt denies edema. Neuro: Alert & oriented x 3.   ASSESSMENT & PLAN: 1. Chronic systolic => diastolic CHF: Suspect ischemic cardiomyopathy.  Has St Jude CRT-P device due to post-op complete heart block.  Echo 04/18/18 with EF 30-35%, grade 2 DD, RV normal. Echo in 9/21 with EF up to 50-55%.  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 NYHA class III symptoms chronically.   - Volume managed by HD - Continue Coreg 25 mg bid.  - Continue hydralazine 100 bid (does not think she can remember tid).  Continue imdur 120 mg daily - Given concern for cardiac amyloidosis, I will send myeloma panel and arrange for PYP scan.  2. ESRD: Now on HD MWF.  3. HTN: Continue current meds.  4.   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  Had bioprosthetic MVR in 8/19.  Echo in 9/21 showed stable bioprosthetic mitral valve.  5. Atrial fibrillation: Paroxysmal.  Now s/p Maze in 8/19. No palpitations.  - Continue warfarin.  6. CAD:  LHC in 1/19 showed severe ostial LCx disease and moderate LAD disease. She had LIMA-LAD and SVG-OM in 8/19. She has frequent atypical chest pain. Cardiolite in 9/21 showed no ischemia/infarction.  - Continue atorvastatin 80 mg daily. Good lipids in 9/21.  - She is on warfarin so no ASA use.  - If chest pain progresses, next step would be evaluation via cath.  7. Carotid bruits: Carotid dopplers scheduled.   Followup in 6 wks with NP/PA.   COVID screen The patient does not have any symptoms that suggest any further testing/ screening at this time.  Social distancing reinforced today.  Patient Risk: After full review of this patients clinical status, I feel that they are at moderate risk for cardiac decompensation at this time.  Relevant cardiac medications were reviewed at length with the patient today. The patient does not have concerns regarding their medications at this time.   Today, I have spent 18 minutes with the patient with telehealth technology discussing the above issues .    Signed, [name], MD  04/03/2020  [facility] [address] [facility] [place] [address] [phone] (office) [phone] (fax)

## 2020-04-12 ENCOUNTER — Telehealth (HOSPITAL_COMMUNITY): Payer: Self-pay | Admitting: Cardiology

## 2020-04-12 ENCOUNTER — Ambulatory Visit (HOSPITAL_COMMUNITY): Payer: Medicare Other

## 2020-04-12 NOTE — Addendum Note (Signed)
Encounter addended by: Scarlette Calico, RN on: 04/12/2020 1:02 PM  Actions taken: Order list changed, Diagnosis association updated, Clinical Note Signed

## 2020-04-12 NOTE — Progress Notes (Signed)
Pt request to have labs done at [facility] in [place], order faxed to them at [phone]

## 2020-04-12 NOTE — Telephone Encounter (Signed)
error 

## 2020-04-17 ENCOUNTER — Other Ambulatory Visit (HOSPITAL_COMMUNITY): Payer: Medicare Other

## 2020-04-17 ENCOUNTER — Ambulatory Visit: Payer: Medicare Other | Admitting: Vascular Surgery

## 2020-04-18 ENCOUNTER — Telehealth (HOSPITAL_COMMUNITY): Payer: Self-pay | Admitting: Cardiology

## 2020-04-18 NOTE — Telephone Encounter (Signed)
Patient request lab orders to be faxed to [facility] [phone]. Thanks

## 2020-04-19 NOTE — Telephone Encounter (Signed)
Order faxed to [facility]

## 2020-04-24 ENCOUNTER — Ambulatory Visit (INDEPENDENT_AMBULATORY_CARE_PROVIDER_SITE_OTHER): Payer: Medicare Other

## 2020-04-24 ENCOUNTER — Ambulatory Visit (HOSPITAL_COMMUNITY)
Admission: RE | Admit: 2020-04-24 | Payer: Medicare Other | Source: Ambulatory Visit | Attending: Cardiology | Admitting: Cardiology

## 2020-04-24 DIAGNOSIS — I442 Atrioventricular block, complete: Secondary | ICD-10-CM | POA: Diagnosis not present

## 2020-04-24 LAB — CUP PACEART REMOTE DEVICE CHECK
Battery Remaining Longevity: 91 mo
Battery Remaining Percentage: 95.5 %
Battery Voltage: 2.98 V
Brady Statistic AP VP Percent: 71 %
Brady Statistic AP VS Percent: 1 %
Brady Statistic AS VP Percent: 28 %
Brady Statistic AS VS Percent: 1 %
Brady Statistic RA Percent Paced: 70 %
Date Time Interrogation Session: 20211026020010
Implantable Lead Implant Date: 20190830
Implantable Lead Implant Date: 20190830
Implantable Lead Implant Date: 20190830
Implantable Lead Location: 753858
Implantable Lead Location: 753859
Implantable Lead Location: 753860
Implantable Pulse Generator Implant Date: 20190830
Lead Channel Impedance Value: 360 Ohm
Lead Channel Impedance Value: 400 Ohm
Lead Channel Impedance Value: 660 Ohm
Lead Channel Pacing Threshold Amplitude: 0.625 V
Lead Channel Pacing Threshold Amplitude: 0.75 V
Lead Channel Pacing Threshold Amplitude: 1 V
Lead Channel Pacing Threshold Pulse Width: 0.5 ms
Lead Channel Pacing Threshold Pulse Width: 0.5 ms
Lead Channel Pacing Threshold Pulse Width: 0.8 ms
Lead Channel Sensing Intrinsic Amplitude: 1.8 mV
Lead Channel Sensing Intrinsic Amplitude: 7.4 mV
Lead Channel Setting Pacing Amplitude: 1.625
Lead Channel Setting Pacing Amplitude: 2 V
Lead Channel Setting Pacing Amplitude: 2 V
Lead Channel Setting Pacing Pulse Width: 0.5 ms
Lead Channel Setting Pacing Pulse Width: 0.8 ms
Lead Channel Setting Sensing Sensitivity: 2 mV
Pulse Gen Model: 3562
Pulse Gen Serial Number: 9043873

## 2020-04-26 NOTE — Progress Notes (Signed)
Remote pacemaker transmission.   

## 2020-05-07 ENCOUNTER — Telehealth (HOSPITAL_COMMUNITY): Payer: Self-pay | Admitting: Vascular Surgery

## 2020-05-07 NOTE — Telephone Encounter (Signed)
Left pt message giving PYP scan appt, 05/22/20 @ 8:30AM, LEFT DETAILED MESSAGE ON LOCATION , DATE AND TIME, asked pt to call back to confirm appt

## 2020-05-15 ENCOUNTER — Ambulatory Visit (HOSPITAL_COMMUNITY)
Admission: RE | Admit: 2020-05-15 | Payer: Medicare Other | Source: Ambulatory Visit | Attending: Cardiology | Admitting: Cardiology

## 2020-05-21 ENCOUNTER — Ambulatory Visit (HOSPITAL_COMMUNITY): Payer: Medicare Other | Attending: Cardiology

## 2020-05-21 ENCOUNTER — Ambulatory Visit (HOSPITAL_COMMUNITY): Payer: Medicare Other

## 2020-05-29 ENCOUNTER — Other Ambulatory Visit (HOSPITAL_COMMUNITY): Payer: Medicare Other

## 2020-05-29 ENCOUNTER — Ambulatory Visit: Payer: Medicare Other | Admitting: Vascular Surgery

## 2020-06-12 ENCOUNTER — Encounter (HOSPITAL_COMMUNITY): Payer: Medicare Other | Admitting: Cardiology

## 2020-07-02 ENCOUNTER — Other Ambulatory Visit: Payer: Self-pay | Admitting: Physician Assistant

## 2020-07-02 DIAGNOSIS — Z992 Dependence on renal dialysis: Secondary | ICD-10-CM

## 2020-07-02 DIAGNOSIS — N186 End stage renal disease: Secondary | ICD-10-CM

## 2020-07-25 ENCOUNTER — Telehealth: Payer: Self-pay | Admitting: Cardiology

## 2020-07-25 NOTE — Telephone Encounter (Signed)
Jocelyn Hill states she is returning a call from the office she received today. I was unable to find documentation as to what it was in regards to. Please advise.

## 2020-07-25 NOTE — Telephone Encounter (Signed)
Call may have come from primary card Dr. Aundra Dubin office.

## 2020-07-25 NOTE — Telephone Encounter (Signed)
Returned call to patient Advised call was not documented from the CHF clinic (no recent labs, procedures, or tests). Pt does not have an upcoming appt or open triage encounter. Advised if someone from our office did call they would reach out again.    ? [facility] office reaching out regarding recall.?

## 2020-07-28 ENCOUNTER — Other Ambulatory Visit: Payer: Self-pay | Admitting: Internal Medicine

## 2020-08-29 ENCOUNTER — Ambulatory Visit (INDEPENDENT_AMBULATORY_CARE_PROVIDER_SITE_OTHER): Payer: Medicare Other

## 2020-08-29 DIAGNOSIS — I442 Atrioventricular block, complete: Secondary | ICD-10-CM | POA: Diagnosis not present

## 2020-08-29 DIAGNOSIS — I5042 Chronic combined systolic (congestive) and diastolic (congestive) heart failure: Secondary | ICD-10-CM

## 2020-08-30 LAB — CUP PACEART REMOTE DEVICE CHECK
Battery Remaining Longevity: 88 mo
Battery Remaining Percentage: 95.5 %
Battery Voltage: 2.98 V
Brady Statistic AP VP Percent: 96 %
Brady Statistic AP VS Percent: 1 %
Brady Statistic AS VP Percent: 2 %
Brady Statistic AS VS Percent: 1 %
Brady Statistic RA Percent Paced: 95 %
Date Time Interrogation Session: 20220302161250
Implantable Lead Implant Date: 20190830
Implantable Lead Implant Date: 20190830
Implantable Lead Implant Date: 20190830
Implantable Lead Location: 753858
Implantable Lead Location: 753859
Implantable Lead Location: 753860
Implantable Pulse Generator Implant Date: 20190830
Lead Channel Impedance Value: 340 Ohm
Lead Channel Impedance Value: 390 Ohm
Lead Channel Impedance Value: 650 Ohm
Lead Channel Pacing Threshold Amplitude: 0.5 V
Lead Channel Pacing Threshold Amplitude: 0.625 V
Lead Channel Pacing Threshold Amplitude: 0.75 V
Lead Channel Pacing Threshold Pulse Width: 0.5 ms
Lead Channel Pacing Threshold Pulse Width: 0.5 ms
Lead Channel Pacing Threshold Pulse Width: 0.8 ms
Lead Channel Sensing Intrinsic Amplitude: 1.3 mV
Lead Channel Sensing Intrinsic Amplitude: 6.5 mV
Lead Channel Setting Pacing Amplitude: 1.5 V
Lead Channel Setting Pacing Amplitude: 2 V
Lead Channel Setting Pacing Amplitude: 2 V
Lead Channel Setting Pacing Pulse Width: 0.5 ms
Lead Channel Setting Pacing Pulse Width: 0.8 ms
Lead Channel Setting Sensing Sensitivity: 2 mV
Pulse Gen Model: 3562
Pulse Gen Serial Number: 9043873

## 2020-09-06 NOTE — Progress Notes (Signed)
Remote pacemaker transmission.   

## 2020-09-07 ENCOUNTER — Telehealth (HOSPITAL_COMMUNITY): Payer: Self-pay | Admitting: Cardiology

## 2020-09-07 NOTE — Telephone Encounter (Signed)
Records returned to [facility] Fax # [phone] Attn Dr Priscille Kluver

## 2020-09-21 ENCOUNTER — Telehealth: Payer: Self-pay

## 2020-09-21 NOTE — Telephone Encounter (Signed)
Pt is being seen by the [facility] in [place]. I cancelled her remotes and marked her inactive in [facility].

## 2021-01-07 ENCOUNTER — Encounter: Payer: Medicare Other | Admitting: Cardiology

## 2021-01-07 NOTE — Progress Notes (Deleted)
Electrophysiology Office Note   Date:  01/07/2021   ID:  Jocelyn Hill, DOB 02-Nov-1950, MRN [id number]  PCP:  [name], MD  Cardiologist:  Aundra Dubin Primary Electrophysiologist:  Constance Haw, MD    No chief complaint on file.    History of Present Illness: Jocelyn Hill is a 70 y.o. female who is being seen today for the evaluation of complete AV block at the request of Antil, Venia Carbon, MD. Presenting today for electrophysiology evaluation.    She has a history significant for mitral regurgitation, chronic diastolic heart failure, coronary artery disease status post MI, hypertension, diabetes, stage IV-V CKD, and stroke.  She presented to [facility] 03/12/2017 with hypoxic respiratory failure.  Echo showed normal systolic function with severe MR.  She was diuresed with Lasix over 20 pounds.  She was thought not a candidate for MVR at the time.  Hospital course was complicated by respiratory failure, renal failure, atrial fibrillation, and retroperitoneal bleed with right hydronephrosis.  She also had E. coli bacteremia.  She was admitted 02/09/2018 through 03/05/2018 for CABG and MVR with a prosthetic valve.  She underwent CRT-P implant due to complete heart block on 02/26/2018.  Today, denies symptoms of palpitations, chest pain, shortness of breath, orthopnea, PND, lower extremity edema, claudication, dizziness, presyncope, syncope, bleeding, or neurologic sequela. The patient is tolerating medications without difficulties. ***    Past Medical History:  Diagnosis Date   Anemia    low iron   Anxiety    Arthritis    Bronchitis    Cataract    bilateral - MD justing watching   CHF (congestive heart failure) (HCC)    Coronary artery disease    Depression    Diabetes mellitus without complication (HCC)    Diet-controlled diabetes mellitus ([place])    Dyspnea    with exertion   Dysrhythmia    PAF   ESRD (end stage renal disease) ([place]) 03/16/2017   MWF- [facility]  [place]   Fibromyalgia    Ganglion cyst    left wrist   GERD (gastroesophageal reflux disease)    Headache    chronic headaches- in the past (07/14/2019)   Heart failure (HCC)    Heart murmur    History of blood transfusion    Hyperlipidemia    Hypertension    Hypothyroidism    Mitral regurgitation    Myocardial infarction ([place])    3x last one 2008   PAF (paroxysmal atrial fibrillation) ([place])    Pneumonia 2020   x 2   Presence of permanent cardiac pacemaker 2019   St Jude BIV Pacer   S/P Maze operation for atrial fibrillation 02/18/2018   Complete bilateral atrial lesion set using bipolar radiofrequency and cryothermy ablation with clipping of LA appendage   S/P mitral valve replacement with bioprosthetic valve 02/18/2018   [facility] Mitral stented bovine pericardial tissue valve Model 7300 TFX Serial # [id number] Size 29   Stroke ([place])    no lasting residual - ? [id number]   Umbilical hernia    Past Surgical History:  Procedure Laterality Date   A/V FISTULAGRAM Right 01/12/2020   Procedure: A/V FISTULAGRAM;  Surgeon: Marty Heck, MD;  Location: [place] [facility];  Service: Cardiovascular;  Laterality: Right;   ABDOMINAL HYSTERECTOMY     PARTIALS   AV FISTULA PLACEMENT Left 04/23/2018   Procedure: LEFT ARTERIOVENOUS (AV) GRAFT CREATION;  Surgeon: Marty Heck, MD;  Location: [place];  Service: Vascular;  Laterality: Left;   AV FISTULA PLACEMENT Right 07/15/2019   Procedure: ARTERIOVENOUS (AV) FISTULA CREATION;  Surgeon: Marty Heck, MD;  Location: [place];  Service: Vascular;  Laterality: Right;   [place] Right 11/03/2019   Procedure: RIGHT SECOND STAGE [place];  Surgeon: Marty Heck, MD;  Location: [place];  Service: Vascular;  Laterality: Right;   BIOPSY  09/06/2019   Procedure: BIOPSY;  Surgeon: [name], MD;  Location: [facility] ENDOSCOPY;  Service: Gastroenterology;;   BIV PACEMAKER INSERTION CRT-P N/A 02/26/2018    Procedure: BIV PACEMAKER INSERTION CRT-P;  Surgeon: Constance Haw, MD;  Location: [place] [facility];  Service: Cardiovascular;  Laterality: N/A;   CARDIAC CATHETERIZATION     CESAREAN SECTION     COLONOSCOPY     COLONOSCOPY WITH PROPOFOL N/A 09/06/2019   Procedure: COLONOSCOPY WITH PROPOFOL;  Surgeon: [name], MD;  Location: [place];  Service: Gastroenterology;  Laterality: N/A;   CORONARY ARTERY BYPASS GRAFT N/A 02/18/2018   Procedure: CORONARY ARTERY BYPASS GRAFTING (CABG) WITH IMA. ENDOSCOPIC VEIN HARVEST. IMA TO LAD, SVG TO CIRC.;  Surgeon: Rexene Alberts, MD;  Location: [place];  Service: Open Heart Surgery;  Laterality: N/A;   ESOPHAGOGASTRODUODENOSCOPY (EGD) WITH PROPOFOL N/A 09/06/2019   Procedure: ESOPHAGOGASTRODUODENOSCOPY (EGD) WITH PROPOFOL;  Surgeon: [name], MD;  Location: [place];  Service: Gastroenterology;  Laterality: N/A;   HEMOSTASIS CLIP PLACEMENT  09/06/2019   Procedure: HEMOSTASIS CLIP PLACEMENT;  Surgeon: [name], MD;  Location: [place];  Service: Gastroenterology;;   HOT HEMOSTASIS N/A 09/06/2019   Procedure: HOT HEMOSTASIS (ARGON PLASMA COAGULATION/BICAP);  Surgeon: [name], MD;  Location: [place];  Service: Gastroenterology;  Laterality: N/A;   INSERTION OF DIALYSIS CATHETER N/A 02/18/2018   Procedure: PLACEMENT OF TEMPORARY HD CATHETER, POWER TRIALYSIS 13FR 20CM;  Surgeon: Rexene Alberts, MD;  Location: [place];  Service: Vascular;  Laterality: N/A;   INSERTION OF DIALYSIS CATHETER Right 04/23/2018   Procedure: INSERTION OF DIALYSIS CATHETER;  Surgeon: Marty Heck, MD;  Location: [place];  Service: Vascular;  Laterality: Right;   INSERTION OF DIALYSIS CATHETER Right 03/01/2019   Procedure: INSERTION OF DIALYSIS CATHETER RIGHT Femoral;  Surgeon: Elam Dutch, MD;  Location: [place];  Service: Vascular;  Laterality: Right;   IR NEPHRO TUBE REMOV/FL  04/02/2017   IR NEPHROSTOGRAM RIGHT THRU EXISTING ACCESS  04/02/2017   IR  NEPHROSTOMY PLACEMENT RIGHT  03/27/2017   IR THORACENTESIS ASP PLEURAL SPACE W/IMG GUIDE  03/24/2017   LIGATION ARTERIOVENOUS GORTEX GRAFT Left 03/01/2019   Procedure: LIGATION ARTERIOVENOUS GORTEX GRAFT Left arm;  Surgeon: Elam Dutch, MD;  Location: [place];  Service: Vascular;  Laterality: Left;   MAZE N/A 02/18/2018   Procedure: MAZE;  Surgeon: Rexene Alberts, MD;  Location: [place];  Service: Open Heart Surgery;  Laterality: N/A;   MITRAL VALVE REPLACEMENT N/A 02/18/2018   Procedure: MITRAL VALVE (MV) REPLACEMENT;  Surgeon: Rexene Alberts, MD;  Location: [place];  Service: Open Heart Surgery;  Laterality: N/A;   MULTIPLE EXTRACTIONS WITH ALVEOLOPLASTY N/A 11/05/2017   Procedure: Extraction of tooth #'s 4,5,7,8,9,10,12,14 and 29 with alveoloplasty and gross debridement of remaining teeth;  Surgeon: [name], DDS;  Location: [place];  Service: Oral Surgery;  Laterality: N/A;   PATENT FORAMEN OVALE(PFO) CLOSURE N/A 02/18/2018   Procedure: PATENT FORAMEN OVALE (PFO) CLOSURE;  Surgeon: Rexene Alberts, MD;  Location: [place];  Service: Open Heart Surgery;  Laterality: N/A;  PERIPHERAL VASCULAR BALLOON ANGIOPLASTY Right 01/12/2020   Procedure: PERIPHERAL VASCULAR BALLOON ANGIOPLASTY;  Surgeon: Marty Heck, MD;  Location: [place] [facility];  Service: Cardiovascular;  Laterality: Right;  arm fistula   POLYPECTOMY  09/06/2019   Procedure: POLYPECTOMY;  Surgeon: [name], MD;  Location[facility] ENDOSCOPY;  Service: Gastroenterology;;   REMOVAL OF GRAFT Left 04/13/2019   Procedure: REMOVAL OF INFECTED GRAFT LEFT UPPER ARM WITH VEIN PATCH OF LEFT BRACHIAL ARTERY;  Surgeon: Rosetta Posner, MD;  Location: [place];  Service: Vascular;  Laterality: Left;   RIGHT/LEFT HEART CATH AND CORONARY ANGIOGRAPHY N/A 07/03/2017   Procedure: RIGHT/LEFT HEART CATH AND CORONARY ANGIOGRAPHY;  Surgeon: Larey Dresser, MD;  Location: [place] [facility];  Service: Cardiovascular;  Laterality: N/A;   TEE WITHOUT  CARDIOVERSION N/A 10/08/2017   Procedure: TRANSESOPHAGEAL ECHOCARDIOGRAM (TEE);  Surgeon: Larey Dresser, MD;  Location: [facility] ENDOSCOPY;  Service: Cardiovascular;  Laterality: N/A;   TEE WITHOUT CARDIOVERSION N/A 02/18/2018   Procedure: TRANSESOPHAGEAL ECHOCARDIOGRAM (TEE);  Surgeon: Rexene Alberts, MD;  Location: [place];  Service: Open Heart Surgery;  Laterality: N/A;   TONSILLECTOMY     TUBAL LIGATION     UPPER EXTREMITY VENOGRAPHY Right 06/09/2019   Procedure: UPPER EXTREMITY VENOGRAPHY;  Surgeon: Marty Heck, MD;  Location: [place] [facility];  Service: Cardiovascular;  Laterality: Right;     Current Outpatient Medications  Medication Sig Dispense Refill   acetaminophen (TYLENOL) 500 MG tablet Take 1,500-2,000 mg by mouth 2 (two) times daily as needed for moderate pain or headache.      albuterol (PROVENTIL HFA;VENTOLIN HFA) 108 (90 Base) MCG/ACT inhaler Inhale 2 puffs into the lungs every 6 (six) hours as needed for wheezing or shortness of breath.     ALPRAZolam (XANAX) 0.25 MG tablet Take 1 tablet (0.25 mg total) by mouth at bedtime as needed for anxiety. 30 tablet 0   amLODipine (NORVASC) 10 MG tablet Take 10 mg by mouth at bedtime.     atorvastatin (LIPITOR) 80 MG tablet Take 1 tablet (80 mg total) by mouth daily. (Patient taking differently: Take 80 mg by mouth at bedtime. ) 30 tablet 3   AURYXIA 1 GM 210 MG(Fe) tablet Take 210 mg by mouth daily.      b complex vitamins tablet Take 1 tablet by mouth at bedtime.     busPIRone (BUSPAR) 15 MG tablet Take 1 tablet (15 mg total) by mouth 2 (two) times daily. 60 tablet 0   calcitRIOL (ROCALTROL) 0.5 MCG capsule Take 1 capsule (0.5 mcg total) by mouth every Monday, Wednesday, and Friday with hemodialysis.     carvedilol (COREG) 25 MG tablet Take 1 tablet (25 mg total) by mouth 2 (two) times daily with a meal. 60 tablet 3   cholecalciferol (VITAMIN D) 1000 units tablet Take 1,000 Units by mouth at bedtime.       dextromethorphan-guaiFENesin (MUCINEX DM) 30-600 MG 12hr tablet Take 1 tablet by mouth 2 (two) times daily as needed for cough. 60 tablet 0   diphenhydrAMINE (BENADRYL) 25 MG tablet Take 1 tablet (25 mg total) by mouth daily as needed for itching. 30 tablet 0   doxazosin (CARDURA) 4 MG tablet Take 4-8 mg by mouth See admin instructions. Take one tablet (4 mg) by mouth every morning and two tablets (8 mg) at night     gabapentin (NEURONTIN) 300 MG capsule Take 1 capsule (300 mg total) by mouth 3 (three) times a week. After dialysis on  M/W/F 12 capsule 1   gabapentin (NEURONTIN) 800 MG tablet Take 800 mg by mouth at bedtime.      heparin 1000 UNIT/ML injection 1 mL (1,000 Units total) by Intracatheter route every Monday, Wednesday, and Friday with hemodialysis. 1 mL    hydrALAZINE (APRESOLINE) 100 MG tablet Take 1 tablet (100 mg total) by mouth 2 (two) times daily. 60 tablet 3   iron sucrose in sodium chloride 0.9 % 100 mL Iron Sucrose (Venofer)     isosorbide mononitrate (IMDUR) 120 MG 24 hr tablet Take 1 tablet (120 mg total) by mouth at bedtime. 30 tablet 0   levothyroxine (SYNTHROID) 100 MCG tablet Take 100 mcg by mouth daily before breakfast.     lidocaine-prilocaine (EMLA) cream Apply 1 application topically daily as needed (port).      meclizine (ANTIVERT) 25 MG tablet Take 0.5-1 tablets (12.5-25 mg total) by mouth 3 (three) times daily as needed for dizziness. 10 tablet 0   montelukast (SINGULAIR) 10 MG tablet Take 1 tablet (10 mg total) by mouth daily. (Patient taking differently: Take 10 mg by mouth at bedtime. ) 30 tablet 0   multivitamin (RENA-VIT) TABS tablet Take 1 tablet by mouth at bedtime.  0   nitroGLYCERIN (NITROSTAT) 0.4 MG SL tablet Place 0.4 mg under the tongue every 5 (five) minutes as needed for chest pain.     Nutritional Supplements (FEEDING SUPPLEMENT, NEPRO CARB STEADY,) LIQD Take 237 mLs by mouth 3 (three) times daily between meals.  0   pantoprazole (PROTONIX) 40 MG tablet  Take 1 tablet (40 mg total) by mouth 2 (two) times daily before a meal. (Patient taking differently: Take 40 mg by mouth daily. ) 60 tablet 0   polyethylene glycol (MIRALAX / GLYCOLAX) packet Take 17 g by mouth daily. (Patient taking differently: Take 17 g by mouth daily as needed for moderate constipation. ) 14 each 0   tiZANidine (ZANAFLEX) 4 MG tablet Take 4 mg by mouth 3 (three) times daily as needed.     traZODone (DESYREL) 100 MG tablet Take 100 mg by mouth at bedtime.     warfarin (COUMADIN) 3 MG tablet Take 1 tablet (3 mg total) by mouth daily at 6 PM. (Patient taking differently: Take 3-6 mg by mouth See admin instructions. 6 mg on Sunday, and 3 mg all other days) 30 tablet 0   No current facility-administered medications for this visit.    Allergies:   Ace inhibitors and Motrin ib [ibuprofen]   Social History:  The patient  reports that she quit smoking about 42 years ago. Her smoking use included cigarettes. She has never used smokeless tobacco. She reports previous drug use. She reports that she does not drink alcohol.   Family History:  The patient's family history includes Hypertension in her mother.   ROS:  Please see the history of present illness.   Otherwise, review of systems is positive for none.   All other systems are reviewed and negative.   PHYSICAL EXAM: VS:  LMP  (LMP Unknown)  , BMI There is no height or weight on file to calculate BMI. GEN: Well nourished, well developed, in no acute distress  HEENT: normal  Neck: no JVD, carotid bruits, or masses Cardiac: ***RRR; no murmurs, rubs, or gallops,no edema  Respiratory:  clear to auscultation bilaterally, normal work of breathing GI: soft, nontender, nondistended, + BS MS: no deformity or atrophy  Skin: warm and dry, device site well healed Neuro:  Strength and sensation are  intact Psych: euthymic mood, full affect  EKG:  EKG {ACTION; IS/IS [id number] ordered today. Personal review of the ekg ordered *** shows  ***  Personal review of the device interrogation today. Results in [place]: 01/12/2020: BUN 20; Creatinine, Ser 3.60; Potassium 3.5; Sodium 141 03/02/2020: Hemoglobin 11.9; Platelets 166    Lipid Panel     Component Value Date/Time   CHOL 97 03/02/2020 1016   TRIG 87 03/02/2020 1016   HDL 50 03/02/2020 1016   CHOLHDL 1.9 03/02/2020 1016   VLDL 17 03/02/2020 1016   LDLCALC 30 03/02/2020 1016     Wt Readings from Last 3 Encounters:  04/03/20 123 lb 12.8 oz (56.2 kg)  03/22/20 124 lb (56.2 kg)  03/02/20 124 lb (56.2 kg)      Other studies Reviewed: Additional studies/ records that were reviewed today include: TTE 03/23/2020 Review of the above records today demonstrates:   1. Left ventricular ejection fraction, by estimation, is 50 to 55%. The  left ventricle has low normal function. The left ventricle has no regional  wall motion abnormalities. There is severe concentric left ventricular  hypertrophy. Left ventricular  diastolic parameters are consistent with Grade III diastolic dysfunction  (restrictive).   2. Left ventricle with a speckled pattern suggesting infiltrative  disease.   3. The average left ventricular global longitudinal strain is -11.9 %.  The global longitudinal strain is abnormal, with pattern suggestive of  cardiac amyloid. May benefit from further testing with PYP scan.   4. Right ventricular systolic function is normal. The right ventricular  size is normal. There is moderately elevated pulmonary artery systolic  pressure.   5. Left atrial size was severely dilated.   6. Right atrial size was severely dilated.   7. The mitral valve has been repaired/replaced. No evidence of mitral  valve regurgitation. No evidence of mitral stenosis.   8. Tricuspid valve regurgitation is severe.   9. The aortic valve is normal in structure. Aortic valve regurgitation is  not visualized. No aortic stenosis is present.  10. There is a evidence of a  catheter/wire in the IVC. The inferior vena  cava is dilated in size with >50% respiratory variability, suggesting  right atrial pressure of 8 mmHg.   ASSESSMENT AND PLAN:  1.  Complete heart block: Status post [facility] Jude CRT-P implanted 8 3819.  Device functioning appropriately.  No changes.  2.  Chronic systolic and diastolic heart failure: Ejection fraction has improved to 50 to 55%.  No obvious volume overload.  3.  Mitral regurgitation, severe: Status post MVR/maze 12/19/2017.  Plan per primary cardiology.  4.  Paroxysmal atrial fibrillation: Currently on Eliquis.  Status post maze.  CHA2DS2-VASc of 4.  Remains in sinus rhythm.  5.  Coronary artery disease: Status post CABG x2.  No current chest pain.  6.  Hypertension:***  7.  End-stage renal disease: Dialysis Monday Wednesday Friday.  Current medicines are reviewed at length with the patient today.   The patient does not have concerns regarding her medicines.  The following changes were made today: ***   Labs/ tests ordered today include:  No orders of the defined types were placed in this encounter.    Disposition:   FU with Cydnie Deason *** months  Signed, Loris Seelye Meredith Leeds, MD  01/07/2021 10:37 AM     [facility] [address] [place] [address] [phone] (office) [phone] (fax)

## 2021-10-28 ENCOUNTER — Other Ambulatory Visit: Payer: Self-pay

## 2021-10-28 NOTE — Telephone Encounter (Signed)
This is a CHF pt 

## 2023-07-01 DEATH — deceased
